# Patient Record
Sex: Female | Born: 1960 | Race: White | Hispanic: No | Marital: Single | Smoking: Current every day smoker
Health system: Southern US, Community
[De-identification: ages and names within clinical notes are randomized; demographics above are authoritative.]

## PROBLEM LIST (undated history)

## (undated) DIAGNOSIS — I639 Cerebral infarction, unspecified: Secondary | ICD-10-CM

## (undated) DIAGNOSIS — Z9989 Dependence on other enabling machines and devices: Secondary | ICD-10-CM

## (undated) DIAGNOSIS — E119 Type 2 diabetes mellitus without complications: Secondary | ICD-10-CM

## (undated) DIAGNOSIS — Z720 Tobacco use: Secondary | ICD-10-CM

## (undated) DIAGNOSIS — K219 Gastro-esophageal reflux disease without esophagitis: Secondary | ICD-10-CM

## (undated) DIAGNOSIS — N186 End stage renal disease: Secondary | ICD-10-CM

## (undated) DIAGNOSIS — I071 Rheumatic tricuspid insufficiency: Secondary | ICD-10-CM

## (undated) DIAGNOSIS — E559 Vitamin D deficiency, unspecified: Secondary | ICD-10-CM

## (undated) DIAGNOSIS — N189 Chronic kidney disease, unspecified: Secondary | ICD-10-CM

## (undated) DIAGNOSIS — E785 Hyperlipidemia, unspecified: Secondary | ICD-10-CM

## (undated) DIAGNOSIS — F32A Depression, unspecified: Secondary | ICD-10-CM

## (undated) DIAGNOSIS — Z9289 Personal history of other medical treatment: Secondary | ICD-10-CM

## (undated) DIAGNOSIS — G459 Transient cerebral ischemic attack, unspecified: Secondary | ICD-10-CM

## (undated) DIAGNOSIS — M810 Age-related osteoporosis without current pathological fracture: Secondary | ICD-10-CM

## (undated) DIAGNOSIS — I10 Essential (primary) hypertension: Secondary | ICD-10-CM

## (undated) DIAGNOSIS — K5900 Constipation, unspecified: Secondary | ICD-10-CM

## (undated) HISTORY — DX: Transient cerebral ischemic attack, unspecified: G45.9

## (undated) HISTORY — PX: REFRACTIVE SURGERY: SHX103

## (undated) HISTORY — DX: Hyperlipidemia, unspecified: E78.5

## (undated) HISTORY — PX: TONSILLECTOMY AND ADENOIDECTOMY: SHX28

## (undated) HISTORY — DX: Cerebral infarction, unspecified: I63.9

---
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
## 1998-08-16 ENCOUNTER — Emergency Department (HOSPITAL_COMMUNITY): Admission: EM | Admit: 1998-08-16 | Discharge: 1998-08-16 | Payer: Self-pay | Admitting: Emergency Medicine

## 1999-09-06 ENCOUNTER — Other Ambulatory Visit: Admission: RE | Admit: 1999-09-06 | Discharge: 1999-09-06 | Payer: Self-pay | Admitting: Family Medicine

## 1999-09-10 ENCOUNTER — Encounter: Payer: Self-pay | Admitting: Family Medicine

## 1999-09-10 ENCOUNTER — Encounter: Admission: RE | Admit: 1999-09-10 | Discharge: 1999-09-10 | Payer: Self-pay | Admitting: Family Medicine

## 1999-09-13 ENCOUNTER — Other Ambulatory Visit: Admission: RE | Admit: 1999-09-13 | Discharge: 1999-09-13 | Payer: Self-pay | Admitting: Obstetrics and Gynecology

## 1999-09-25 ENCOUNTER — Other Ambulatory Visit: Admission: RE | Admit: 1999-09-25 | Discharge: 1999-09-25 | Payer: Self-pay | Admitting: Obstetrics and Gynecology

## 2000-09-15 ENCOUNTER — Other Ambulatory Visit: Admission: RE | Admit: 2000-09-15 | Discharge: 2000-09-15 | Payer: Self-pay | Admitting: Family Medicine

## 2000-09-17 ENCOUNTER — Encounter: Payer: Self-pay | Admitting: Family Medicine

## 2000-09-17 ENCOUNTER — Encounter: Admission: RE | Admit: 2000-09-17 | Discharge: 2000-09-17 | Payer: Self-pay | Admitting: Family Medicine

## 2001-09-20 ENCOUNTER — Ambulatory Visit (HOSPITAL_COMMUNITY): Admission: RE | Admit: 2001-09-20 | Discharge: 2001-09-20 | Payer: Self-pay | Admitting: Family Medicine

## 2001-09-20 ENCOUNTER — Other Ambulatory Visit: Admission: RE | Admit: 2001-09-20 | Discharge: 2001-09-20 | Payer: Self-pay | Admitting: Family Medicine

## 2001-09-20 ENCOUNTER — Encounter: Payer: Self-pay | Admitting: Family Medicine

## 2001-09-29 ENCOUNTER — Encounter: Admission: RE | Admit: 2001-09-29 | Discharge: 2001-12-28 | Payer: Self-pay | Admitting: Family Medicine

## 2002-10-24 ENCOUNTER — Other Ambulatory Visit: Admission: RE | Admit: 2002-10-24 | Discharge: 2002-10-24 | Payer: Self-pay | Admitting: Family Medicine

## 2002-12-15 ENCOUNTER — Encounter: Admission: RE | Admit: 2002-12-15 | Discharge: 2003-03-15 | Payer: Self-pay | Admitting: Family Medicine

## 2003-02-08 ENCOUNTER — Encounter: Payer: Self-pay | Admitting: Family Medicine

## 2003-02-08 ENCOUNTER — Ambulatory Visit (HOSPITAL_COMMUNITY): Admission: RE | Admit: 2003-02-08 | Discharge: 2003-02-08 | Payer: Self-pay | Admitting: Family Medicine

## 2004-04-09 ENCOUNTER — Ambulatory Visit: Payer: Self-pay | Admitting: Family Medicine

## 2004-04-19 ENCOUNTER — Other Ambulatory Visit: Admission: RE | Admit: 2004-04-19 | Discharge: 2004-04-19 | Payer: Self-pay | Admitting: Family Medicine

## 2004-07-22 ENCOUNTER — Ambulatory Visit: Payer: Self-pay | Admitting: Family Medicine

## 2004-08-01 ENCOUNTER — Ambulatory Visit: Payer: Self-pay | Admitting: Family Medicine

## 2004-09-05 ENCOUNTER — Ambulatory Visit: Payer: Self-pay | Admitting: Family Medicine

## 2004-11-13 ENCOUNTER — Emergency Department (HOSPITAL_COMMUNITY): Admission: EM | Admit: 2004-11-13 | Discharge: 2004-11-13 | Payer: Self-pay | Admitting: Emergency Medicine

## 2004-11-15 ENCOUNTER — Ambulatory Visit: Payer: Self-pay | Admitting: Family Medicine

## 2005-12-11 ENCOUNTER — Encounter: Payer: Self-pay | Admitting: Family Medicine

## 2007-02-09 DIAGNOSIS — E1129 Type 2 diabetes mellitus with other diabetic kidney complication: Secondary | ICD-10-CM | POA: Insufficient documentation

## 2007-02-09 DIAGNOSIS — E1165 Type 2 diabetes mellitus with hyperglycemia: Secondary | ICD-10-CM

## 2007-02-09 DIAGNOSIS — I10 Essential (primary) hypertension: Secondary | ICD-10-CM | POA: Insufficient documentation

## 2007-02-09 DIAGNOSIS — E785 Hyperlipidemia, unspecified: Secondary | ICD-10-CM | POA: Insufficient documentation

## 2007-02-09 DIAGNOSIS — K219 Gastro-esophageal reflux disease without esophagitis: Secondary | ICD-10-CM | POA: Insufficient documentation

## 2011-01-02 ENCOUNTER — Telehealth: Payer: Self-pay | Admitting: Family Medicine

## 2011-01-02 NOTE — Telephone Encounter (Signed)
I left a voice message for pt to return our call. We received a copy of her lab results in today and wanted to know if she wanted to be worked in for a office visit tomorrow, if not then Dr. Sarajane Jews would be back next week and she needs to follow up to discuss lab results.

## 2011-03-12 ENCOUNTER — Emergency Department (HOSPITAL_COMMUNITY): Payer: 59

## 2011-03-12 ENCOUNTER — Encounter (HOSPITAL_COMMUNITY): Payer: Self-pay | Admitting: Radiology

## 2011-03-12 ENCOUNTER — Observation Stay (HOSPITAL_COMMUNITY)
Admission: EM | Admit: 2011-03-12 | Discharge: 2011-03-13 | Disposition: A | Discharge status: Home / Self Care | Payer: 59 | Attending: Emergency Medicine | Admitting: Emergency Medicine

## 2011-03-12 DIAGNOSIS — E119 Type 2 diabetes mellitus without complications: Secondary | ICD-10-CM | POA: Insufficient documentation

## 2011-03-12 DIAGNOSIS — I6350 Cerebral infarction due to unspecified occlusion or stenosis of unspecified cerebral artery: Principal | ICD-10-CM | POA: Insufficient documentation

## 2011-03-12 DIAGNOSIS — I10 Essential (primary) hypertension: Secondary | ICD-10-CM | POA: Insufficient documentation

## 2011-03-12 DIAGNOSIS — G459 Transient cerebral ischemic attack, unspecified: Secondary | ICD-10-CM

## 2011-03-12 HISTORY — DX: Essential (primary) hypertension: I10

## 2011-03-12 HISTORY — DX: Transient cerebral ischemic attack, unspecified: G45.9

## 2011-03-12 LAB — URINE MICROSCOPIC-ADD ON

## 2011-03-12 LAB — CBC
Hemoglobin: 14 g/dL (ref 12.0–15.0)
MCHC: 36.7 g/dL — ABNORMAL HIGH (ref 30.0–36.0)
WBC: 8.8 10*3/uL (ref 4.0–10.5)

## 2011-03-12 LAB — DIFFERENTIAL
Basophils Absolute: 0 10*3/uL (ref 0.0–0.1)
Basophils Relative: 0 % (ref 0–1)
Monocytes Absolute: 0.4 10*3/uL (ref 0.1–1.0)
Neutro Abs: 6 10*3/uL (ref 1.7–7.7)
Neutrophils Relative %: 68 % (ref 43–77)

## 2011-03-12 LAB — URINALYSIS, ROUTINE W REFLEX MICROSCOPIC
Glucose, UA: >1000 mg/dL — AB
Leukocytes, UA: NEGATIVE
Protein, ur: NEGATIVE mg/dL
Urobilinogen, UA: 0.2 mg/dL (ref 0.0–1.0)

## 2011-03-12 LAB — APTT: aPTT: 26 seconds (ref 24–37)

## 2011-03-12 LAB — COMPREHENSIVE METABOLIC PANEL
CO2: 26 mEq/L (ref 19–32)
Calcium: 10.7 mg/dL — ABNORMAL HIGH (ref 8.4–10.5)
Creatinine, Ser: <0.47 mg/dL — ABNORMAL LOW (ref 0.50–1.10)
Glucose, Bld: 357 mg/dL — ABNORMAL HIGH (ref 70–99)

## 2011-03-12 LAB — PROTIME-INR: INR: 0.94 (ref 0.00–1.49)

## 2011-03-13 ENCOUNTER — Observation Stay (HOSPITAL_COMMUNITY): Payer: 59

## 2011-03-13 DIAGNOSIS — R221 Localized swelling, mass and lump, neck: Secondary | ICD-10-CM

## 2011-03-13 DIAGNOSIS — R220 Localized swelling, mass and lump, head: Secondary | ICD-10-CM

## 2011-03-13 DIAGNOSIS — G459 Transient cerebral ischemic attack, unspecified: Secondary | ICD-10-CM

## 2011-03-13 LAB — POCT I-STAT, CHEM 8
BUN: 13 mg/dL (ref 6–23)
Chloride: 101 mEq/L (ref 96–112)
Sodium: 135 mEq/L (ref 135–145)
TCO2: 23 mmol/L (ref 0–100)

## 2011-03-13 LAB — LIPID PANEL
Cholesterol: 273 mg/dL — ABNORMAL HIGH (ref 0–200)
HDL: 33 mg/dL — ABNORMAL LOW
LDL Cholesterol: UNDETERMINED mg/dL (ref 0–99)
Total CHOL/HDL Ratio: 8.3 ratio
Triglycerides: 974 mg/dL — ABNORMAL HIGH
VLDL: UNDETERMINED mg/dL (ref 0–40)

## 2011-03-13 LAB — GLUCOSE, CAPILLARY
Glucose-Capillary: 244 mg/dL — ABNORMAL HIGH (ref 70–99)
Glucose-Capillary: 358 mg/dL — ABNORMAL HIGH (ref 70–99)

## 2011-03-13 LAB — CK TOTAL AND CKMB (NOT AT ARMC)
CK, MB: 2.6 ng/mL (ref 0.3–4.0)
Relative Index: INVALID (ref 0.0–2.5)
Total CK: 53 U/L (ref 7–177)

## 2011-03-13 LAB — TROPONIN I: Troponin I: <0.3 ng/mL (ref <0.30)

## 2011-03-18 ENCOUNTER — Other Ambulatory Visit (HOSPITAL_COMMUNITY)
Admission: RE | Admit: 2011-03-18 | Discharge: 2011-03-18 | Disposition: A | Discharge status: Home / Self Care | Payer: 59 | Source: Ambulatory Visit | Attending: Family Medicine | Admitting: Family Medicine

## 2011-03-18 ENCOUNTER — Ambulatory Visit (INDEPENDENT_AMBULATORY_CARE_PROVIDER_SITE_OTHER): Payer: Medicare Other | Admitting: Family Medicine

## 2011-03-18 ENCOUNTER — Encounter: Payer: Self-pay | Admitting: Family Medicine

## 2011-03-18 VITALS — BP 136/80 | HR 107 | Temp 98.6°F | Ht <= 58 in | Wt 83.0 lb

## 2011-03-18 DIAGNOSIS — Z0000 Encounter for general adult medical examination without abnormal findings: Secondary | ICD-10-CM

## 2011-03-18 DIAGNOSIS — Z01419 Encounter for gynecological examination (general) (routine) without abnormal findings: Secondary | ICD-10-CM | POA: Insufficient documentation

## 2011-03-18 MED ORDER — METFORMIN HCL 1000 MG PO TABS: 1000.0000 mg | ORAL_TABLET | Freq: Two times a day (BID) | ORAL | Status: DC

## 2011-03-18 MED ORDER — FENOFIBRATE 145 MG PO TABS: 145.0000 mg | ORAL_TABLET | Freq: Every day | ORAL | Status: DC

## 2011-03-18 MED ORDER — GLIPIZIDE 10 MG PO TABS: 10.0000 mg | ORAL_TABLET | Freq: Two times a day (BID) | ORAL | Status: DC

## 2011-03-18 MED ORDER — RAMIPRIL 10 MG PO CAPS: 10.0000 mg | ORAL_CAPSULE | Freq: Every day | ORAL | Status: DC

## 2011-03-18 NOTE — Progress Notes (Signed)
Subjective:    Patient ID: [name] Michael, female    DOB: 06-01-1961, 50 y.o.   MRN: [id number]  HPI 50 yr old female to re-establish with [name] and for a cpx. I have not seen her for about 5 years. She has been going to Urgent Care during this time. She has a hx of poorly controlled diabetes, even from my memory of taking care of her in the past. She has not had a GYN exam for over 5 years. She was admitted for an overnight stay at [facility] on 03-12-11 for an apparent TIA. She says her thinking suddenly became foggy that day, and she had trouble raising both of her arms above her head. I cannot locate any records of this stay in the computer, but she did bring in some papers today. Her A1c that day was 12.9, her TG were over 900, and her LDL was high. She ws sent home on the meds listed below. Since getting home she has felt fine with no neurologic deficits. She has been out of work since 03-12-11 on [employer].    Review of Systems  Constitutional: Negative.  Negative for fever, diaphoresis, activity change, appetite change, fatigue and unexpected weight change.  HENT: Negative.  Negative for hearing loss, ear pain, nosebleeds, congestion, sore throat, trouble swallowing, neck pain, neck stiffness, voice change and tinnitus.   Eyes: Negative.  Negative for photophobia, pain, discharge, redness and visual disturbance.  Respiratory: Negative.  Negative for apnea, cough, choking, chest tightness, shortness of breath, wheezing and stridor.   Cardiovascular: Negative.  Negative for chest pain, palpitations and leg swelling.  Gastrointestinal: Negative.  Negative for nausea, vomiting, abdominal pain, diarrhea, constipation, blood in stool, abdominal distention and rectal pain.  Genitourinary: Negative.  Negative for dysuria, urgency, frequency, hematuria, flank pain, vaginal bleeding, vaginal discharge, enuresis, difficulty urinating, vaginal pain and menstrual problem.  Musculoskeletal: Negative.  Negative  for myalgias, back pain, joint swelling, arthralgias and gait problem.  Skin: Negative.  Negative for color change, pallor, rash and wound.  Neurological: Negative.  Negative for dizziness, tremors, seizures, syncope, speech difficulty, weakness, light-headedness, numbness and headaches.  Hematological: Negative.  Negative for adenopathy. Does not bruise/bleed easily.  Psychiatric/Behavioral: Negative.  Negative for hallucinations, behavioral problems, confusion, sleep disturbance, dysphoric mood and agitation. The patient is not nervous/anxious.        Objective:   Physical Exam  Constitutional: She appears well-developed and well-nourished. No distress.  HENT:  Head: Normocephalic and atraumatic.  Right Ear: External ear normal.  Left Ear: External ear normal.  Nose: Nose normal.  Mouth/Throat: Oropharynx is clear and moist. No oropharyngeal exudate.  Eyes: Conjunctivae and EOM are normal. Pupils are equal, round, and reactive to light. Right eye exhibits no discharge. Left eye exhibits no discharge. No scleral icterus.  Neck: Normal range of motion. Neck supple. No JVD present. No thyromegaly present.  Cardiovascular: Normal rate, regular rhythm, normal heart sounds and intact distal pulses.  Exam reveals no gallop and no friction rub.   No murmur heard. Pulmonary/Chest: Effort normal and breath sounds normal. No stridor. No respiratory distress. She has no wheezes. She has no rales. She exhibits no tenderness.  Abdominal: Soft. Normal appearance and bowel sounds are normal. She exhibits no distension, no abdominal bruit, no ascites and no mass. There is no hepatosplenomegaly. There is no tenderness. There is no rigidity, no rebound and no guarding. No hernia.  Genitourinary: Rectum normal, vagina normal and uterus normal. No breast swelling, tenderness,  discharge or bleeding. Cervix exhibits no motion tenderness, no discharge and no friability. Right adnexum displays no mass, no tenderness  and no fullness. Left adnexum displays no mass, no tenderness and no fullness. No erythema, tenderness or bleeding around the vagina. No vaginal discharge found.  Musculoskeletal: Normal range of motion. She exhibits no edema and no tenderness.  Lymphadenopathy:    She has no cervical adenopathy.  Neurological: She is alert. She has normal reflexes. No cranial nerve deficit. She exhibits normal muscle tone. Coordination normal.  Skin: Skin is warm and dry. No rash noted. She is not diaphoretic. No erythema. No pallor.  Psychiatric: She has a normal mood and affect. Her behavior is normal. Judgment and thought content normal.          Assessment & Plan:  Well exam. I urged her to set up a mammogram soon. She is recovering nicely from a recent TIA. Her lipids and diabetes are obviously quite out of control. We will increase her meds as above, and she needs labwork again in about 2 months. We will plan for her to return to work on 03-24-11.

## 2011-03-19 ENCOUNTER — Telehealth: Payer: Self-pay | Admitting: Family Medicine

## 2011-03-19 DIAGNOSIS — E785 Hyperlipidemia, unspecified: Secondary | ICD-10-CM

## 2011-03-19 NOTE — Telephone Encounter (Signed)
Pt needs labwork in 2 month per pt. What labs to sch?

## 2011-03-21 NOTE — Telephone Encounter (Signed)
She will need fasting lipids, hepatic panel, and A1c

## 2011-03-24 NOTE — Telephone Encounter (Signed)
I put future lab order in computer and left message for pt to call and schedule the lab appointment.

## 2011-03-25 ENCOUNTER — Telehealth: Payer: Self-pay | Admitting: Family Medicine

## 2011-03-25 NOTE — Telephone Encounter (Signed)
Message copied by Rudi Coco on Tue Mar 25, 2011 12:56 PM ------      Message from: Alysia Penna A      Created: Fri Mar 21, 2011  1:57 PM       Normal. Repeat in one year

## 2011-03-25 NOTE — Telephone Encounter (Signed)
Left voice message with normal results.

## 2011-03-28 ENCOUNTER — Telehealth: Payer: Self-pay | Admitting: *Deleted

## 2011-03-28 ENCOUNTER — Emergency Department (HOSPITAL_COMMUNITY): Payer: 59

## 2011-03-28 ENCOUNTER — Encounter (HOSPITAL_COMMUNITY): Payer: Self-pay | Admitting: Radiology

## 2011-03-28 ENCOUNTER — Inpatient Hospital Stay (HOSPITAL_COMMUNITY)
Admission: EM | Admit: 2011-03-28 | Discharge: 2011-04-01 | DRG: 065 | Disposition: A | Discharge status: Home / Self Care | Payer: 59 | Attending: Neurology | Admitting: Neurology

## 2011-03-28 ENCOUNTER — Other Ambulatory Visit (HOSPITAL_COMMUNITY): Payer: 59

## 2011-03-28 ENCOUNTER — Encounter: Payer: Self-pay | Admitting: Family Medicine

## 2011-03-28 DIAGNOSIS — I672 Cerebral atherosclerosis: Secondary | ICD-10-CM | POA: Diagnosis present

## 2011-03-28 DIAGNOSIS — Q2111 Secundum atrial septal defect: Secondary | ICD-10-CM

## 2011-03-28 DIAGNOSIS — Z7982 Long term (current) use of aspirin: Secondary | ICD-10-CM

## 2011-03-28 DIAGNOSIS — I700 Atherosclerosis of aorta: Secondary | ICD-10-CM | POA: Diagnosis present

## 2011-03-28 DIAGNOSIS — E119 Type 2 diabetes mellitus without complications: Secondary | ICD-10-CM | POA: Diagnosis present

## 2011-03-28 DIAGNOSIS — F17200 Nicotine dependence, unspecified, uncomplicated: Secondary | ICD-10-CM | POA: Diagnosis present

## 2011-03-28 DIAGNOSIS — I6350 Cerebral infarction due to unspecified occlusion or stenosis of unspecified cerebral artery: Principal | ICD-10-CM | POA: Diagnosis present

## 2011-03-28 DIAGNOSIS — E7800 Pure hypercholesterolemia, unspecified: Secondary | ICD-10-CM | POA: Diagnosis present

## 2011-03-28 DIAGNOSIS — I10 Essential (primary) hypertension: Secondary | ICD-10-CM | POA: Diagnosis present

## 2011-03-28 DIAGNOSIS — Q211 Atrial septal defect: Secondary | ICD-10-CM

## 2011-03-28 LAB — CBC
Hemoglobin: 12.6 g/dL (ref 12.0–15.0)
MCH: 29 pg (ref 26.0–34.0)
MCHC: 34.4 g/dL (ref 30.0–36.0)
RDW: 12.7 % (ref 11.5–15.5)

## 2011-03-28 LAB — POCT I-STAT, CHEM 8
BUN: 10 mg/dL (ref 6–23)
Calcium, Ion: 1.17 mmol/L (ref 1.12–1.32)
Chloride: 103 mEq/L (ref 96–112)
Creatinine, Ser: 0.4 mg/dL — ABNORMAL LOW (ref 0.50–1.10)
Glucose, Bld: 226 mg/dL — ABNORMAL HIGH (ref 70–99)
HCT: 40 % (ref 36.0–46.0)
Hemoglobin: 13.6 g/dL (ref 12.0–15.0)
Potassium: 3.6 mEq/L (ref 3.5–5.1)
Sodium: 138 mEq/L (ref 135–145)
TCO2: 25 mmol/L (ref 0–100)

## 2011-03-28 LAB — COMPREHENSIVE METABOLIC PANEL
ALT: 10 U/L (ref 0–35)
AST: 12 U/L (ref 0–37)
CO2: 28 mEq/L (ref 19–32)
Calcium: 9.5 mg/dL (ref 8.4–10.5)
GFR calc non Af Amer: >90 mL/min (ref >90)
Potassium: 3.8 mEq/L (ref 3.5–5.1)
Sodium: 139 mEq/L (ref 135–145)
Total Protein: 7 g/dL (ref 6.0–8.3)

## 2011-03-28 LAB — GLUCOSE, CAPILLARY

## 2011-03-28 LAB — CK TOTAL AND CKMB (NOT AT ARMC): Total CK: 74 U/L (ref 7–177)

## 2011-03-28 LAB — TROPONIN I: Troponin I: <0.3 ng/mL (ref <0.30)

## 2011-03-28 MED ORDER — GADOBENATE DIMEGLUMINE 529 MG/ML IV SOLN
8.0000 mL | Freq: Once | INTRAVENOUS | Status: AC | PRN
  Administered 2011-03-28: 8 mL via INTRAVENOUS

## 2011-03-28 NOTE — Telephone Encounter (Signed)
Pt calls stating she is having numbness and weakness over and had a mini stroke last week.  Advised to call 911 and go straight to [facility] ER to rule out CVA.

## 2011-03-28 NOTE — Telephone Encounter (Signed)
agreed

## 2011-03-29 ENCOUNTER — Inpatient Hospital Stay (HOSPITAL_COMMUNITY): Payer: 59

## 2011-03-29 LAB — DRUGS OF ABUSE SCREEN W/O ALC, ROUTINE URINE
Benzodiazepines.: NEGATIVE
Opiate Screen, Urine: NEGATIVE
Phencyclidine (PCP): NEGATIVE
Propoxyphene: NEGATIVE

## 2011-03-29 LAB — URINALYSIS, MICROSCOPIC ONLY
Leukocytes, UA: NEGATIVE
Nitrite: NEGATIVE
Specific Gravity, Urine: 1.014 (ref 1.005–1.030)
pH: 6 (ref 5.0–8.0)

## 2011-03-29 LAB — LIPID PANEL
Total CHOL/HDL Ratio: 5.1 RATIO
VLDL: 71 mg/dL — ABNORMAL HIGH (ref 0–40)

## 2011-03-29 LAB — GLUCOSE, CAPILLARY
Glucose-Capillary: 188 mg/dL — ABNORMAL HIGH (ref 70–99)
Glucose-Capillary: 165 mg/dL — ABNORMAL HIGH (ref 70–99)

## 2011-03-30 LAB — GLUCOSE, CAPILLARY: Glucose-Capillary: 178 mg/dL — ABNORMAL HIGH (ref 70–99)

## 2011-03-31 DIAGNOSIS — Q211 Atrial septal defect: Secondary | ICD-10-CM

## 2011-03-31 LAB — GLUCOSE, CAPILLARY
Glucose-Capillary: 165 mg/dL — ABNORMAL HIGH (ref 70–99)
Glucose-Capillary: 113 mg/dL — ABNORMAL HIGH (ref 70–99)

## 2011-04-01 DIAGNOSIS — M79609 Pain in unspecified limb: Secondary | ICD-10-CM

## 2011-04-01 DIAGNOSIS — I639 Cerebral infarction, unspecified: Secondary | ICD-10-CM

## 2011-04-01 HISTORY — DX: Cerebral infarction, unspecified: I63.9

## 2011-04-01 LAB — GLUCOSE, CAPILLARY
Glucose-Capillary: 210 mg/dL — ABNORMAL HIGH (ref 70–99)
Glucose-Capillary: 223 mg/dL — ABNORMAL HIGH (ref 70–99)

## 2011-04-01 LAB — C4 COMPLEMENT: Complement C4, Body Fluid: 37 mg/dL (ref 10–40)

## 2011-04-01 LAB — PROTEIN S ACTIVITY: Protein S Activity: 138 % — ABNORMAL HIGH (ref 69–129)

## 2011-04-01 LAB — LUPUS ANTICOAGULANT PANEL: PTT Lupus Anticoagulant: 34.2 secs (ref 30.0–45.6)

## 2011-04-01 LAB — BETA-2-GLYCOPROTEIN I ABS, IGG/M/A
Beta-2 Glyco I IgG: 0 G Units (ref <20)
Beta-2-Glycoprotein I IgA: 5 A Units (ref <20)

## 2011-04-01 LAB — ANTITHROMBIN III: AntiThromb III Func: 128 % — ABNORMAL HIGH (ref 76–126)

## 2011-04-01 LAB — PROTEIN C ACTIVITY: Protein C Activity: 200 % — ABNORMAL HIGH (ref 75–133)

## 2011-04-01 NOTE — H&P (Signed)
[name], Michael                ACCOUNT NO.:  [id number]  MEDICAL RECORD NO.:  [id number]  LOCATION:  [id number]                         FACILITY:  [place]  PHYSICIAN:  Jill Alexanders, M.D.  DATE OF BIRTH:  [birth year]  DATE OF ADMISSION:  03/28/2011 DATE OF DISCHARGE:                             HISTORY & PHYSICAL   HISTORY OF PRESENT ILLNESS:  [name] Michael is a 50 year old right-handed white female born on 02-22-1961.  This patient was seen through the emergency room on March 12, 2011, was found to have a subcortical stroke affecting the left centrum semiovale and left putamen area.  This patient was treated with aspirin.  A carotid Doppler study done at that time was unremarkable without significant carotid stenosis.  A 2-D echocardiogram also showed no significant abnormalities.  The patient had done relatively well but today around 11 a.m., the patient noted onset of problems with right arm numbness and tingling sensations.  The patient denied any weakness.  The patient was still able to ambulate, denied any visual field changes, headache or evidence of dizziness, visual field changes.  The patient has undergone MRI scan of the brain once again showing very small acute infarcts in the deep white matter on the left felt to be new from prior strokes.  The patient is being brought in for further evaluation.  PAST MEDICAL HISTORY:  Significant for: 1. Diabetes. 2. Hypertension. 3. Dyslipidemia. 4. Tonsillectomy. 5. Recent left brain stroke.  MEDICATIONS: 1. Metformin 1000 mg twice daily. 2. Glimepiride unknown dose twice daily. 3. Aspirin 81 mg daily. 4. TriCor 145 mg daily. 5. Ramipril 10 mg daily.  ALLERGIES:  The patient has an allergy to HYDROCODONE which causes nausea and smokes half-pack of cigarettes daily.  Does not drink alcohol.  SOCIAL HISTORY:  This patient is single, has no children, lives alone, works in [employer].  FAMILY MEDICAL HISTORY:  Mother died  with diabetes, congestive heart failure, end-stage renal disease.  Father died with stroke and pneumonia.  REVIEW OF SYSTEMS:  Notable for some occasional chills.  The patient has had some left jaw pain that she relates to her teeth.  The patient has had a slight headache today.  Denies shortness of breath, chest pain, abdominal pain, nausea, vomiting.  The patient denies any blackout episodes or dizziness.  PHYSICAL EXAMINATION:  VITAL SIGNS:  Blood pressure is 163/99, heart rate 106, respiratory rate 16, temperature afebrile. GENERAL:  This patient is a fairly well-developed, petite white female who is alert and cooperative at the time of examination. HEENT:  Head is atraumatic.  Eyes:  Pupils are equal, round, reactive to light.  Disks were not visualized. NECK:  Supple.  No carotid bruits noted. RESPIRATORY:  Clear. CARDIOVASCULAR:  Regular rate and rhythm.  No obvious murmurs or rubs noted. EXTREMITIES:  Without significant edema. NEUROLOGIC:  Cranial nerves as above.  Facial symmetry is present.  The patient has good sensation of face to pinprick and soft touch bilaterally.  She has good strength in facial muscle, muscles to head turn and shoulder shrug bilaterally.  Speech is well enunciated, not aphasic.  Motor testing reveals 5/5 strength in all fours.  Good symmetric motor tone is noted throughout.  Sensory testing is intact to pinprick, soft touch, vibratory sensation throughout.  The patient has good finger-nose-finger and heel-to-shin.  Gait was not tested.  No drift seen in the arms or legs.  Deep tendon reflexes are depressed but symmetric.  Toes are neutral bilaterally.  LABORATORY VALUES:  Notable for a white count of 7.9, hemoglobin of 12.6, hematocrit of 36.6, MCV of 84.1, platelets of 386.  CBG is 225. Sodium 138, potassium 3.6, chloride of 103, glucose of 226, BUN of 10, creatinine 0.4, ionized calcium 1.17.  MRI scan of the head is as  above.  IMPRESSION: 1. Subcortical left brain stroke recurring in the last 2 weeks. 2. Diabetes. 3. Hypertension.  The patient does have some risk factors for stroke, but has had two events in the last couple of weeks.  The patient will be brought in for brief evaluation.  The patient will undergo an MRI angiogram of the extracranial vessels which was not done before and will contact Cardiology concerning scheduling for a TEE.  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  We will follow the patient's clinical course while in- house.     Jill Alexanders, M.D.     CKW/MEDQ  D:  03/28/2011  T:  03/29/2011  Job:  [id number]  Electronically Signed by Roxy Cedar M.D. on 04/01/2011 [id number] PM

## 2011-04-02 LAB — PROTEIN S, TOTAL: Protein S Ag, Total: 223 % — ABNORMAL HIGH (ref 60–150)

## 2011-04-02 LAB — COMPLEMENT, TOTAL: Compl, Total (CH50): >60 U/mL — ABNORMAL HIGH (ref 31–60)

## 2011-04-02 LAB — PROTEIN C, TOTAL: Protein C, Total: 219 % — ABNORMAL HIGH (ref 72–160)

## 2011-04-03 NOTE — Discharge Summary (Signed)
[name], [name]                ACCOUNT NO.:  [id number]  MEDICAL RECORD NO.:  [id number]  LOCATION:  [id number]                         FACILITY:  [facility]  PHYSICIAN:  Rita Prom P. [name], MD    DATE OF BIRTH:  [birth year]  DATE OF ADMISSION:  03/28/2011 DATE OF DISCHARGE:  04/01/2011                              DISCHARGE SUMMARY   ADMISSION DIAGNOSIS:  Stroke.  DISCHARGE DIAGNOSES: 1. Acute and subacute left frontal subcortical infarcts, secondary to     intracranial arteriosclerosis as well as small vessel disease. 2. Uncontrolled diabetes. 3. Hypertension. 4. Hyperlipidemia. 5. Patent foramen ovale.  HOSPITAL COURSE:  [name] Michael is a 50 year old Caucasian lady, who was presented to [facility] initially on September 19 and found to have left frontal subcortical infarct involving the centrum semiovale and putamen.  I was started on aspirin and at that time workup included carotid ultrasound, which was unremarkable and 2D echo showed normal ejection fraction.  She presented on March 28, 2011, with sudden onset of right upper extremity tingling and numbness, and some decreased fine motor dexterity.  She underwent MRI scan of the brain, which showed acute infarct in the left frontal white matter adjacent to the previous subcortical infarcts.  She is admitted for further evaluation.  MRA of the brain showed no large vessel occlusion.  MRA of the neck showed no extracranial large vessel stenosis.  Hemoglobin A1c was significantly elevated at 11.6 with a mean plasma glucose of 286 mg%.  Urine-drug screen was negative.  Total cholesterol 175, triglycerides elevated at 355, HDL was low at 34, and LDL was normal at 70 mg%.  ANA panel was negative.  Hypercoagulable panel labs were pending at the time of discharge.  Only labs which were back was antithrombin 3 and homocysteine, which were normal.  Complement levels were normal.  RPR was negative.  ESR was 40 mm.  The 2D echo showed  normal ejection fraction of 60-65% without obvious cardiac source of embolism. Transesophageal echocardiogram showed normal ejection fraction of 60-65% and showed a patent foramen ovale with right to left cardiac shunt.  The patient had lower extremity venous Doppler, which showed no evidence of deep vein thrombosis.  The patient was on aspirin, which was switched to Plavix for secondary stroke prevention.  She was also started on fenofibrate for elevated triglycerides.  Her symptoms resolved and she had minimum clumsiness in the right hand, but no major deficits at the time of discharge.  She is able to ambulate independently without assistance.  MEDICATIONS AT TIME OF DISCHARGE: 1. Clopidogrel 75 mg daily. 2. Fenofibrate 160 mg daily. 3. Glucotrol 10 mg twice a day. 4. Glucophage 1000 mg twice a day. 5. Metoprolol 25 mg twice a day. 6. Altace 10 mg daily.  DISCHARGE INSTRUCTIONS:  The patient was advised to follow up with Dr. Leonie Michael in his office in 2 weeks for transcranial Doppler bubble study and emboli monitoring as well as with his primary care physician in few weeks.  The patient was also advised to consider possible participation in the [place] PFO closure trial.     [name] Kinker P. [name], MD     PPS/MEDQ  D:  04/01/2011  T:  04/01/2011  Job:  [id number]  Electronically Signed by Antony Contras MD on 04/03/2011 10:07:18 PM

## 2011-04-04 DIAGNOSIS — Z0279 Encounter for issue of other medical certificate: Secondary | ICD-10-CM

## 2011-05-12 ENCOUNTER — Other Ambulatory Visit (INDEPENDENT_AMBULATORY_CARE_PROVIDER_SITE_OTHER): Payer: 59

## 2011-05-12 DIAGNOSIS — E785 Hyperlipidemia, unspecified: Secondary | ICD-10-CM

## 2011-05-12 DIAGNOSIS — E119 Type 2 diabetes mellitus without complications: Secondary | ICD-10-CM

## 2011-05-12 LAB — LIPID PANEL
Cholesterol: 171 mg/dL (ref 0–200)
HDL: 41.1 mg/dL (ref >39.00)
Total CHOL/HDL Ratio: 4
Triglycerides: 307 mg/dL — ABNORMAL HIGH (ref 0.0–149.0)
VLDL: 61.4 mg/dL — ABNORMAL HIGH (ref 0.0–40.0)

## 2011-05-12 LAB — HEMOGLOBIN A1C: Hgb A1c MFr Bld: 8.7 % — ABNORMAL HIGH (ref 4.6–6.5)

## 2011-05-12 LAB — HEPATIC FUNCTION PANEL
AST: 24 U/L (ref 0–37)
Albumin: 4.2 g/dL (ref 3.5–5.2)
Total Bilirubin: 0.4 mg/dL (ref 0.3–1.2)

## 2011-05-13 LAB — LDL CHOLESTEROL, DIRECT: Direct LDL: 105.3 mg/dL

## 2011-05-20 NOTE — Progress Notes (Signed)
Quick Note:  Left voice message ______ 

## 2012-04-02 ENCOUNTER — Other Ambulatory Visit: Payer: Self-pay | Admitting: Family Medicine

## 2012-05-25 ENCOUNTER — Encounter: Payer: Self-pay | Admitting: Family Medicine

## 2012-05-25 ENCOUNTER — Ambulatory Visit (INDEPENDENT_AMBULATORY_CARE_PROVIDER_SITE_OTHER): Payer: Self-pay | Admitting: Family Medicine

## 2012-05-25 VITALS — BP 140/96 | HR 109 | Temp 98.8°F | Ht <= 58 in | Wt 94.0 lb

## 2012-05-25 DIAGNOSIS — E785 Hyperlipidemia, unspecified: Secondary | ICD-10-CM

## 2012-05-25 DIAGNOSIS — E119 Type 2 diabetes mellitus without complications: Secondary | ICD-10-CM

## 2012-05-25 DIAGNOSIS — Z0000 Encounter for general adult medical examination without abnormal findings: Secondary | ICD-10-CM

## 2012-05-25 DIAGNOSIS — Z23 Encounter for immunization: Secondary | ICD-10-CM

## 2012-05-25 LAB — POCT URINALYSIS DIPSTICK
Bilirubin, UA: NEGATIVE
Ketones, UA: NEGATIVE
Leukocytes, UA: NEGATIVE

## 2012-05-25 LAB — CBC WITH DIFFERENTIAL/PLATELET
Basophils Absolute: 0 10*3/uL (ref 0.0–0.1)
Basophils Relative: 0.4 % (ref 0.0–3.0)
Hemoglobin: 12.3 g/dL (ref 12.0–15.0)
Lymphocytes Relative: 27 % (ref 12.0–46.0)
Monocytes Relative: 6 % (ref 3.0–12.0)
Neutro Abs: 6.3 10*3/uL (ref 1.4–7.7)
RBC: 4.98 Mil/uL (ref 3.87–5.11)
WBC: 9.8 10*3/uL (ref 4.5–10.5)

## 2012-05-25 LAB — TSH: TSH: 0.96 u[IU]/mL (ref 0.35–5.50)

## 2012-05-25 LAB — LIPID PANEL
HDL: 40.9 mg/dL (ref >39.00)
Total CHOL/HDL Ratio: 6
VLDL: 121.8 mg/dL — ABNORMAL HIGH (ref 0.0–40.0)

## 2012-05-25 LAB — BASIC METABOLIC PANEL
BUN: 8 mg/dL (ref 6–23)
GFR: 141.12 mL/min (ref >60.00)
Potassium: 4.4 mEq/L (ref 3.5–5.1)
Sodium: 132 mEq/L — ABNORMAL LOW (ref 135–145)

## 2012-05-25 LAB — HEPATIC FUNCTION PANEL
AST: 16 U/L (ref 0–37)
Total Bilirubin: 0.3 mg/dL (ref 0.3–1.2)

## 2012-05-25 MED ORDER — RAMIPRIL 10 MG PO CAPS: 10.0000 mg | ORAL_CAPSULE | Freq: Every day | ORAL | Status: DC

## 2012-05-25 MED ORDER — METFORMIN HCL 1000 MG PO TABS: 1000.0000 mg | ORAL_TABLET | Freq: Two times a day (BID) | ORAL | Status: DC

## 2012-05-25 MED ORDER — GLIPIZIDE 10 MG PO TABS: 10.0000 mg | ORAL_TABLET | Freq: Two times a day (BID) | ORAL | Status: DC

## 2012-05-25 NOTE — Progress Notes (Signed)
  Subjective:    Patient ID: [name] Michael, female    DOB: 09/06/1960, 51 y.o.   MRN: [id number]  HPI 51 yr old female for a cpx. She is doing well in general although her glucoses are a bit labile. Her am fasting readings usually run from 120 to 150, but they may run up to the 300s. Her BP is stable. She had a stroke one year ago which originally caused weakness in the right arm and hand. She went to OT and PT for a while, and she has completely recovered. She took Plavix for one year, but this was stopped and she was switched to aspirin last month when she saw Dr. [name]. He has released her back to our care now.    Review of Systems  Constitutional: Negative.   HENT: Negative.   Eyes: Negative.   Respiratory: Negative.   Cardiovascular: Negative.   Gastrointestinal: Negative.   Genitourinary: Negative for dysuria, urgency, frequency, hematuria, flank pain, decreased urine volume, enuresis, difficulty urinating, pelvic pain and dyspareunia.  Musculoskeletal: Negative.   Skin: Negative.   Neurological: Negative.   Hematological: Negative.   Psychiatric/Behavioral: Negative.        Objective:   Physical Exam  Constitutional: She is oriented to person, place, and time. She appears well-developed and well-nourished. No distress.  HENT:  Head: Normocephalic and atraumatic.  Right Ear: External ear normal.  Left Ear: External ear normal.  Nose: Nose normal.  Mouth/Throat: Oropharynx is clear and moist. No oropharyngeal exudate.  Eyes: Conjunctivae normal and EOM are normal. Pupils are equal, round, and reactive to light. No scleral icterus.  Neck: Normal range of motion. Neck supple. No JVD present. No thyromegaly present.  Cardiovascular: Normal rate, regular rhythm, normal heart sounds and intact distal pulses.  Exam reveals no gallop and no friction rub.   No murmur heard. Pulmonary/Chest: Effort normal and breath sounds normal. No respiratory distress. She has no wheezes. She has  no rales. She exhibits no tenderness.  Abdominal: Soft. Bowel sounds are normal. She exhibits no distension and no mass. There is no tenderness. There is no rebound and no guarding.  Musculoskeletal: Normal range of motion. She exhibits no edema and no tenderness.  Lymphadenopathy:    She has no cervical adenopathy.  Neurological: She is alert and oriented to person, place, and time. She has normal reflexes. No cranial nerve deficit. She exhibits normal muscle tone. Coordination normal.  Skin: Skin is warm and dry. No rash noted. No erythema.  Psychiatric: She has a normal mood and affect. Her behavior is normal. Judgment and thought content normal.          Assessment & Plan:  Well exam. Get fasting labs today including an A1c. She seems to be back at her pre-stroke baseline. Continue on aspirin. Because she has no health insurance at this tine, she declines a colonoscopy

## 2012-05-28 NOTE — Progress Notes (Signed)
Quick Note:  I left a voice message for pt to return my call. ______

## 2012-05-31 MED ORDER — FENOFIBRATE 160 MG PO TABS: 160.0000 mg | ORAL_TABLET | Freq: Every day | ORAL | Status: DC

## 2012-05-31 MED ORDER — INSULIN GLARGINE 100 UNIT/ML ~~LOC~~ SOLN: 20.0000 [IU] | Freq: Every day | SUBCUTANEOUS | Status: DC

## 2012-05-31 MED ORDER — INSULIN PEN NEEDLE 32G X 4 MM MISC: Status: DC

## 2012-05-31 NOTE — Addendum Note (Signed)
Addended by: Aggie Hacker A on: 05/31/2012 02:56 PM   Modules accepted: Orders

## 2012-05-31 NOTE — Progress Notes (Signed)
Quick Note:  I spoke with pt and sent scripts e-scribe. Pt will come by office for injection training on 06/01/12. ______

## 2012-08-24 ENCOUNTER — Emergency Department (HOSPITAL_COMMUNITY)
Admission: EM | Admit: 2012-08-24 | Discharge: 2012-08-24 | Disposition: A | Discharge status: Home / Self Care | Payer: No Typology Code available for payment source | Source: Home / Self Care

## 2012-08-24 ENCOUNTER — Encounter (HOSPITAL_COMMUNITY): Payer: Self-pay

## 2012-08-24 DIAGNOSIS — E781 Pure hyperglyceridemia: Secondary | ICD-10-CM

## 2012-08-24 DIAGNOSIS — I10 Essential (primary) hypertension: Secondary | ICD-10-CM

## 2012-08-24 DIAGNOSIS — E119 Type 2 diabetes mellitus without complications: Secondary | ICD-10-CM

## 2012-08-24 LAB — COMPREHENSIVE METABOLIC PANEL
ALT: 20 U/L (ref 0–35)
AST: 17 U/L (ref 0–37)
CO2: 25 mEq/L (ref 19–32)
Calcium: 10.5 mg/dL (ref 8.4–10.5)
GFR calc non Af Amer: >90 mL/min (ref >90)
Sodium: 136 mEq/L (ref 135–145)

## 2012-08-24 LAB — LIPID PANEL
HDL: 46 mg/dL (ref >39)
LDL Cholesterol: UNDETERMINED mg/dL (ref 0–99)

## 2012-08-24 LAB — HEMOGLOBIN A1C: Hgb A1c MFr Bld: 8.4 % — ABNORMAL HIGH (ref <5.7)

## 2012-08-24 MED ORDER — INSULIN PEN NEEDLE 32G X 4 MM MISC: Status: DC

## 2012-08-24 MED ORDER — ASPIRIN 81 MG PO TABS: 81.0000 mg | ORAL_TABLET | Freq: Every day | ORAL | Status: DC

## 2012-08-24 MED ORDER — GLIPIZIDE 10 MG PO TABS: 10.0000 mg | ORAL_TABLET | Freq: Two times a day (BID) | ORAL | Status: DC

## 2012-08-24 MED ORDER — INSULIN GLARGINE 100 UNIT/ML ~~LOC~~ SOLN: 25.0000 [IU] | Freq: Every day | SUBCUTANEOUS | Status: DC

## 2012-08-24 MED ORDER — PREGABALIN 50 MG PO CAPS: 50.0000 mg | ORAL_CAPSULE | Freq: Every day | ORAL | Status: DC

## 2012-08-24 MED ORDER — RAMIPRIL 10 MG PO CAPS: 10.0000 mg | ORAL_CAPSULE | Freq: Every day | ORAL | Status: DC

## 2012-08-24 MED ORDER — FENOFIBRATE 160 MG PO TABS: 160.0000 mg | ORAL_TABLET | Freq: Every day | ORAL | Status: DC

## 2012-08-24 NOTE — ED Notes (Signed)
Patient has a history of DM Two mini strokes

## 2012-08-24 NOTE — ED Provider Notes (Signed)
History     CSN: [id number]  Arrival date & time 08/24/12  1127  Pleasant 52 year old female with a history of type 2 diabetes, insulin-dependent, hypertension, hypertriglyceridemia, with a last hemoglobin A1c of 10.4 presents to the ER because she has ran out of her medications and also to establish care. The patient states that she has been checking her CBGs at home and they have been running around 120 however the patient's sister-in-law who are complete to the suggesting that the patient is not very accurate with her numbers. The patient lost her job in December and is about to lose her house and has been under a lot of stress.  She denies any chest pain, shortness of breath, dizziness, lightheadedness  Chief Complaint  Patient presents with  . Diabetes   hypertension Hypertriglyceridemia      HPI  Past Medical History  Diagnosis Date  . Diabetes mellitus   . Hypertension   . TIA (transient ischemic attack) 03-12-11  . Hyperlipidemia   . 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 [name]     History reviewed. No pertinent past surgical history.  Family History  Problem Relation Age of Onset  . Stroke Mother   . Diabetes Mother     History  Substance Use Topics  . Smoking status: Current Every Day Smoker -- 0.50 packs/day for 32 years    Types: Cigarettes  . Smokeless tobacco: Never Used     Comment: or less  . Alcohol Use: No    OB History   Grav Para Term Preterm Abortions TAB SAB Ect Mult Living                  Review of Systems As in history of present illness Allergies  Hydrocodone  Home Medications   Current Outpatient Rx  Name  Route  Sig  Dispense  Refill  . aspirin 81 MG tablet   Oral   Take 1 tablet (81 mg total) by mouth daily.   30 tablet   12   . fenofibrate 160 MG tablet   Oral   Take 1 tablet (160 mg total) by mouth daily.   30 tablet   6   . glipiZIDE (GLUCOTROL) 10 MG tablet   Oral   Take 1 tablet (10 mg total) by  mouth 2 (two) times daily before a meal.   60 tablet   2   . insulin glargine (LANTUS SOLOSTAR) 100 UNIT/ML injection   Subcutaneous   Inject 25 Units into the skin at bedtime.   30 mL   4     Dispense flex pen and diagnosis code is 250.00   . Insulin Pen Needle 32G X 4 MM MISC      Use once per day and diagnosis code is 250.00   100 each   12   . Multiple Vitamin (MULTIVITAMIN) tablet   Oral   Take 1 tablet by mouth daily.           . pregabalin (LYRICA) 50 MG capsule   Oral   Take 1 capsule (50 mg total) by mouth daily.   30 capsule   4   . ramipril (ALTACE) 10 MG capsule   Oral   Take 1 capsule (10 mg total) by mouth daily.   30 capsule   2     BP 139/97  Pulse 110  Temp(Src) 98.6 F (37 C) (Oral)  SpO2 98%  Physical Exam Head: Normocephalic and atraumatic.  Right Ear: External ear normal.  Left Ear: External ear normal.  Nose: Nose normal.  Mouth/Throat: Oropharynx is clear and moist. No oropharyngeal exudate.  Eyes: Conjunctivae normal and EOM are normal. Pupils are equal, round, and reactive to light. No scleral icterus.  Neck: Normal range of motion. Neck supple. No JVD present. No thyromegaly present.  Cardiovascular: Normal rate, regular rhythm, normal heart sounds and intact distal pulses. Exam reveals no gallop and no friction rub.  No murmur heard.  Pulmonary/Chest: Effort normal and breath sounds normal. No respiratory distress. She has no wheezes. She has no rales. She exhibits no tenderness.  Abdominal: Soft. Bowel sounds are normal. She exhibits no distension and no mass. There is no tenderness. There is no rebound and no guarding.  Musculoskeletal: Normal range of motion. She exhibits no edema and no tenderness.  Lymphadenopathy:  She has no cervical adenopathy.  Neurological: She is alert and oriented to person, place, and time. She has normal reflexes. No cranial nerve deficit. She exhibits normal muscle tone. Coordination normal.  Skin:  Skin is warm and dry. No rash noted. No erythema.  Psychiatric: She has a normal mood and affect. Her behavior is normal. Judgment and thought content normal.   ED Course  Procedures (including critical care time)  Labs Reviewed  HEMOGLOBIN A1C  LIPID PANEL  COMPREHENSIVE METABOLIC PANEL   No results found.   No diagnosis found.    MDM  Type 2 diabetes, insulin-dependent, her glipizide and her Lantus has been refilled her metformin has been discontinued, will dose of her Lantus has been increased to 25 units, she'll continue with the same dose of glipizide, we'll repeat a hemoglobin A1c, lipid panel  Hypertension, the patient will continue her Altace and recheck her creatinine today  Hypertriglyceridemia continue the patient on fenofibrate, repeat lipid panel today, her last triglyceride level was 606  Patient has been recommended to continue with a baby aspirin  Followup in 2 months         Reyne Dumas, MD 08/24/12 1201

## 2012-09-07 ENCOUNTER — Encounter (HOSPITAL_COMMUNITY): Payer: Self-pay

## 2012-09-07 ENCOUNTER — Emergency Department (INDEPENDENT_AMBULATORY_CARE_PROVIDER_SITE_OTHER)
Admission: EM | Admit: 2012-09-07 | Discharge: 2012-09-07 | Disposition: A | Discharge status: Home / Self Care | Payer: No Typology Code available for payment source | Source: Home / Self Care

## 2012-09-07 DIAGNOSIS — K219 Gastro-esophageal reflux disease without esophagitis: Secondary | ICD-10-CM

## 2012-09-07 DIAGNOSIS — E785 Hyperlipidemia, unspecified: Secondary | ICD-10-CM

## 2012-09-07 DIAGNOSIS — E119 Type 2 diabetes mellitus without complications: Secondary | ICD-10-CM

## 2012-09-07 DIAGNOSIS — I10 Essential (primary) hypertension: Secondary | ICD-10-CM

## 2012-09-07 MED ORDER — DESLORATADINE 5 MG PO TABS: 5.0000 mg | ORAL_TABLET | Freq: Every day | ORAL | Status: DC

## 2012-09-07 MED ORDER — OLMESARTAN MEDOXOMIL 20 MG PO TABS: 20.0000 mg | ORAL_TABLET | Freq: Every day | ORAL | Status: DC

## 2012-09-07 MED ORDER — ROSUVASTATIN CALCIUM 20 MG PO TABS: 20.0000 mg | ORAL_TABLET | Freq: Every day | ORAL | Status: DC

## 2012-09-07 NOTE — ED Provider Notes (Signed)
History     CSN: [id number]  Arrival date & time 09/07/12  1508   First MD Initiated Contact with Patient 09/07/12 1510      Chief Complaint  Patient presents with  . Eye Pain    HPI Agents 52 year old female who presents for followup on diabetes, hypertension, hyperlipidemia. She has had blood tests none recently and would like to know the results. She explains that she has had chronic left eye pain and would like referral to ophthalmologist. Patient denies chest pain or shortness of breath, no specific abdominal or urinary concerns, no significant changes in appetite. No systemic symptoms.  Past Medical History  Diagnosis Date  . Diabetes mellitus   . Hypertension   . TIA (transient ischemic attack) 03-12-11  . Hyperlipidemia   . 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 [name]     History reviewed. No pertinent past surgical history.  Family History  Problem Relation Age of Onset  . Stroke Mother   . Diabetes Mother     History  Substance Use Topics  . Smoking status: Current Every Day Smoker -- 0.50 packs/day for 32 years    Types: Cigarettes  . Smokeless tobacco: Never Used     Comment: or less  . Alcohol Use: No    OB History   Grav Para Term Preterm Abortions TAB SAB Ect Mult Living                  Review of Systems  Constitutional: Negative for fever, chills, diaphoresis, activity change, appetite change and fatigue.  HENT: Negative for ear pain, nosebleeds, congestion, facial swelling, rhinorrhea, neck pain, neck stiffness and ear discharge.   Eyes: Negative for pain, discharge, redness, itching and visual disturbance.  Respiratory: Negative for cough, choking, chest tightness, shortness of breath, wheezing and stridor.   Cardiovascular: Negative for chest pain, palpitations and leg swelling.  Gastrointestinal: Negative for abdominal distention.  Genitourinary: Negative for dysuria, urgency, frequency, hematuria, flank pain, decreased urine  volume, difficulty urinating and dyspareunia.  Musculoskeletal: Negative for back pain, joint swelling, arthralgias and gait problem.  Neurological: Negative for dizziness, tremors, seizures, syncope, facial asymmetry, speech difficulty, weakness, light-headedness, numbness and headaches.  Hematological: Negative for adenopathy. Does not bruise/bleed easily.  Psychiatric/Behavioral: Negative for hallucinations, behavioral problems, confusion, dysphoric mood, decreased concentration and agitation.    Allergies  Hydrocodone  Home Medications   Current Outpatient Rx  Name  Route  Sig  Dispense  Refill  . aspirin 81 MG tablet   Oral   Take 1 tablet (81 mg total) by mouth daily.   30 tablet   12   . fenofibrate 160 MG tablet   Oral   Take 1 tablet (160 mg total) by mouth daily.   30 tablet   6   . glipiZIDE (GLUCOTROL) 10 MG tablet   Oral   Take 1 tablet (10 mg total) by mouth 2 (two) times daily before a meal.   60 tablet   2   . insulin glargine (LANTUS SOLOSTAR) 100 UNIT/ML injection   Subcutaneous   Inject 25 Units into the skin at bedtime.   30 mL   4     Dispense flex pen and diagnosis code is 250.00   . Insulin Pen Needle 32G X 4 MM MISC      Use once per day and diagnosis code is 250.00   100 each   12   . Multiple Vitamin (MULTIVITAMIN) tablet  Oral   Take 1 tablet by mouth daily.           . pregabalin (LYRICA) 50 MG capsule   Oral   Take 1 capsule (50 mg total) by mouth daily.   30 capsule   4   . ramipril (ALTACE) 10 MG capsule   Oral   Take 1 capsule (10 mg total) by mouth daily.   30 capsule   2     BP 146/84  Pulse 110  Temp(Src) 98.1 F (36.7 C) (Oral)  SpO2 98%  Physical Exam  Constitutional: Appears well-developed and well-nourished. No distress.  HENT: Normocephalic. External right and left ear normal. Oropharynx is clear and moist.  Eyes: Conjunctivae and EOM are normal. PERRLA, no scleral icterus.  Neck: Normal ROM. Neck  supple. No JVD. No tracheal deviation. No thyromegaly.  CVS: RRR, S1/S2 +, no murmurs, no gallops, no carotid bruit.  Pulmonary: Effort and breath sounds normal, no stridor, rhonchi, wheezes, rales.  Abdominal: Soft. BS +,  no distension, tenderness, rebound or guarding.  Musculoskeletal: Normal range of motion. No edema and no tenderness.  Lymphadenopathy: No lymphadenopathy noted, cervical, inguinal. Neuro: Alert. Normal reflexes, muscle tone coordination. No cranial nerve deficit. Skin: Skin is warm and dry. No rash noted. Not diaphoretic. No erythema. No pallor.  Psychiatric: Normal mood and affect. Behavior, judgment, thought content normal.    ED Course  Procedures (including critical care time)  Labs Reviewed - No data to display No results found.  Hypertension - We have discussed stable blood pressure and normal target range, I have advised regular blood pressure checks and to call his back if the numbers are persistently higher than 140/90 Hyperlipidemia - Patient has requested change to Crestor as insurance will cover that medicine, prescription provided, discussed findings of fasting lipid panel Diabetes, uncontrolled with neuropathy and retinopathy - Patient has appointment scheduled with ophthalmologist in one week, I have strongly advised to keep the appointment - A1c is trending down from 12->10->8.4 (March 4th, 2014) - Patient will continue taking insulin as per home regimen   MDM  Followup on hypertension, hyperlipidemia, diabetes         Theodis Blaze, MD 09/07/12 1625

## 2012-09-07 NOTE — ED Notes (Signed)
Patient states has pain on left side of head As of sat she can not see out of her left eye Has history of mini strokes  Needs medication refills as well Also would like lab results

## 2012-09-07 NOTE — ED Notes (Signed)
Referral faxed to [facility]- waiting for appt

## 2012-09-20 ENCOUNTER — Other Ambulatory Visit: Payer: Self-pay | Admitting: Family Medicine

## 2012-09-20 DIAGNOSIS — Z1231 Encounter for screening mammogram for malignant neoplasm of breast: Secondary | ICD-10-CM

## 2012-09-21 ENCOUNTER — Emergency Department (INDEPENDENT_AMBULATORY_CARE_PROVIDER_SITE_OTHER)
Admission: EM | Admit: 2012-09-21 | Discharge: 2012-09-21 | Disposition: A | Discharge status: Home Health | Payer: No Typology Code available for payment source | Source: Home / Self Care

## 2012-09-21 DIAGNOSIS — E119 Type 2 diabetes mellitus without complications: Secondary | ICD-10-CM

## 2012-09-21 DIAGNOSIS — I10 Essential (primary) hypertension: Secondary | ICD-10-CM

## 2012-09-21 DIAGNOSIS — E785 Hyperlipidemia, unspecified: Secondary | ICD-10-CM

## 2012-09-21 MED ORDER — ROSUVASTATIN CALCIUM 20 MG PO TABS: 20.0000 mg | ORAL_TABLET | Freq: Every day | ORAL | Status: DC

## 2012-09-21 MED ORDER — GLIPIZIDE 10 MG PO TABS: 10.0000 mg | ORAL_TABLET | Freq: Two times a day (BID) | ORAL | Status: DC

## 2012-09-21 MED ORDER — METFORMIN HCL 1000 MG PO TABS: 1000.0000 mg | ORAL_TABLET | Freq: Two times a day (BID) | ORAL | Status: DC

## 2012-09-21 NOTE — ED Provider Notes (Signed)
History     CSN: [id number]  Arrival date & time 09/21/12  28   First MD Initiated Contact with Patient 09/21/12 1558      No chief complaint on file.   (Consider location/radiation/quality/duration/timing/severity/associated sxs/prior treatment) HPI Patient is 52 year old female with hypertension, diabetes, hyperlipidemia who presents for regular followup. She explains that she cannot afford insulin and would like to come off of it. She says she has been controlling her diet and is trying to exercise regularly. She denies chest pain or shortness of breath, no systemic concerns, no fevers or chills, no abdominal or urinary concerns. Past Medical History  Diagnosis Date  . Diabetes mellitus   . Hypertension   . TIA (transient ischemic attack) 03-12-11  . Hyperlipidemia   . 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 [name]     No past surgical history on file.  Family History  Problem Relation Age of Onset  . Stroke Mother   . Diabetes Mother     History  Substance Use Topics  . Smoking status: Current Every Day Smoker -- 0.50 packs/day for 32 years    Types: Cigarettes  . Smokeless tobacco: Never Used     Comment: or less  . Alcohol Use: No    OB History   Grav Para Term Preterm Abortions TAB SAB Ect Mult Living                  Review of Systems  Constitutional: Negative for fever, chills, diaphoresis, activity change, appetite change and fatigue.  HENT: Negative for ear pain, nosebleeds, congestion, facial swelling, rhinorrhea, neck pain, neck stiffness and ear discharge.   Eyes: Negative for pain, discharge, redness, itching and visual disturbance.  Respiratory: Negative for cough, choking, chest tightness, shortness of breath, wheezing and stridor.   Cardiovascular: Negative for chest pain, palpitations and leg swelling.  Gastrointestinal: Negative for abdominal distention.  Genitourinary: Negative for dysuria, urgency, frequency, hematuria, flank  pain, decreased urine volume, difficulty urinating and dyspareunia.  Musculoskeletal: Negative for back pain, joint swelling, arthralgias and gait problem.  Neurological: Negative for dizziness, tremors, seizures, syncope, facial asymmetry, speech difficulty, weakness, light-headedness, numbness and headaches.  Hematological: Negative for adenopathy. Does not bruise/bleed easily.  Psychiatric/Behavioral: Negative for hallucinations, behavioral problems, confusion, dysphoric mood, decreased concentration and agitation.    Allergies  Hydrocodone  Home Medications   Current Outpatient Rx  Name  Route  Sig  Dispense  Refill  . aspirin 81 MG tablet   Oral   Take 1 tablet (81 mg total) by mouth daily.   30 tablet   12   . desloratadine (CLARINEX) 5 MG tablet   Oral   Take 1 tablet (5 mg total) by mouth daily.   30 tablet   3   . fenofibrate 160 MG tablet   Oral   Take 1 tablet (160 mg total) by mouth daily.   30 tablet   6   . glipiZIDE (GLUCOTROL) 10 MG tablet   Oral   Take 1 tablet (10 mg total) by mouth 2 (two) times daily before a meal.   60 tablet   3   . metFORMIN (GLUCOPHAGE) 1000 MG tablet   Oral   Take 1 tablet (1,000 mg total) by mouth 2 (two) times daily with a meal.   60 tablet   3   . Multiple Vitamin (MULTIVITAMIN) tablet   Oral   Take 1 tablet by mouth daily.           Marland Kitchen  olmesartan (BENICAR) 20 MG tablet   Oral   Take 1 tablet (20 mg total) by mouth daily.   30 tablet   3   . pregabalin (LYRICA) 50 MG capsule   Oral   Take 1 capsule (50 mg total) by mouth daily.   30 capsule   4   . rosuvastatin (CRESTOR) 20 MG tablet   Oral   Take 1 tablet (20 mg total) by mouth daily.   30 tablet   3     BP 149/84  Pulse 100  Temp(Src) 98.2 F (36.8 C)  Physical Exam Constitutional: Appears well-developed and well-nourished. No distress.  HENT: Normocephalic. External right and left ear normal. Oropharynx is clear and moist.  Eyes: Conjunctivae  and EOM are normal. PERRLA, no scleral icterus.  Neck: Normal ROM. Neck supple. No JVD. No tracheal deviation. No thyromegaly.  CVS: RRR, S1/S2 +, no murmurs, no gallops, no carotid bruit.  Pulmonary: Effort and breath sounds normal, no stridor, rhonchi, wheezes, rales.  Abdominal: Soft. BS +,  no distension, tenderness, rebound or guarding.  Musculoskeletal: Normal range of motion. No edema and no tenderness.  Lymphadenopathy: No lymphadenopathy noted, cervical, inguinal. Neuro: Alert. Normal reflexes, muscle tone coordination. No cranial nerve deficit. Skin: Skin is warm and dry. No rash noted. Not diaphoretic. No erythema. No pallor.  Psychiatric: Normal mood and affect. Behavior, judgment, thought content normal.    ED Course  Procedures (including critical care time)  Labs Reviewed - No data to display No results found.   1. DIABETES MELLITUS, TYPE II - given financial difficulties agree with discontinuing Lantus since last A1c checked was 8. I have advised patient to continue monitoring CBGs, continue taking metformin and glipizide, exercise regularly. I advised her to come back in 2 months at which point we'll recheck A1c and decide if insulin is needed.   2. HYPERLIPIDEMIA - we'll provide prescription for statin  3. HYPERTENSION - continue monitoring blood pressure regularly, I have advised patient to check blood pressure regularly and call us back if numbers are higher than 140/90       MDM  Diabetes type 2, hyperlipidemia and hypertension        Theodis Blaze, MD 09/21/12 [id number]

## 2012-10-14 ENCOUNTER — Ambulatory Visit (HOSPITAL_COMMUNITY): Payer: No Typology Code available for payment source

## 2012-11-17 ENCOUNTER — Encounter: Payer: Self-pay | Admitting: Family Medicine

## 2012-11-17 ENCOUNTER — Ambulatory Visit (INDEPENDENT_AMBULATORY_CARE_PROVIDER_SITE_OTHER): Payer: No Typology Code available for payment source | Admitting: Family Medicine

## 2012-11-17 VITALS — BP 160/85 | HR 108 | Temp 98.8°F | Ht <= 58 in | Wt 93.0 lb

## 2012-11-17 DIAGNOSIS — E119 Type 2 diabetes mellitus without complications: Secondary | ICD-10-CM

## 2012-11-17 DIAGNOSIS — H3320 Serous retinal detachment, unspecified eye: Secondary | ICD-10-CM | POA: Insufficient documentation

## 2012-11-17 DIAGNOSIS — H547 Unspecified visual loss: Secondary | ICD-10-CM

## 2012-11-17 DIAGNOSIS — I10 Essential (primary) hypertension: Secondary | ICD-10-CM

## 2012-11-17 MED ORDER — LOVASTATIN 40 MG PO TABS: 40.0000 mg | ORAL_TABLET | Freq: Every day | ORAL | Status: DC

## 2012-11-17 MED ORDER — LISINOPRIL-HYDROCHLOROTHIAZIDE 20-25 MG PO TABS: 1.0000 | ORAL_TABLET | Freq: Every day | ORAL | Status: DC

## 2012-11-17 NOTE — Assessment & Plan Note (Signed)
Elevated today, but out of medication. Will start on Lisinopril-HCTZ since she has DM. If patient is not able to tolerate it ,she will let me know. RTC in 6 weeks for BP recheck and labs.

## 2012-11-17 NOTE — Patient Instructions (Signed)
It was nice to meet you today.   I have sent in a prescription for Lovastatin 40mg  daily and Lisinopril-HCTZ for your blood pressure.  Please call Dr. Jenne Campus to arrange for a nutrition appointment.  Make an appointment at the front desk to see Dr. Valentina Lucks to discuss diabetes and insulin options.  I will see you back in 6-8 weeks for check up and we will check your labs then as well.  Kalon Erhardt M. Paula Busenbark, M.D.

## 2012-11-17 NOTE — Assessment & Plan Note (Signed)
Doctor at [facility] requires nutrition, DM education and better control of DM. Will refer to Dr. Jenne Campus for nutrition and Dr. Valentina Lucks for education. I will be managing her diabetes but at this point in time she states she cannot afford insulin. Will await recommendations for Dr. Valentina Lucks. RTC in 6 weeks.

## 2012-11-17 NOTE — Assessment & Plan Note (Signed)
Uncontrolled. Continue Metformin and Glipizide. Will send to Dr. Valentina Lucks and Dr. Jenne Campus. Follow up in 6 weeks. Will need labs at that time including UA and lipid panel. Pt to be mindful of her intake and keep track of her blood sugars at home.

## 2012-11-17 NOTE — Progress Notes (Signed)
Patient ID: SAJDAH TUNE, female   DOB: 1960/09/18, 51 y.o.   MRN: [id number]  Ruffin. Hairford, MD Phone: [phone]   Subjective: HPI: Patient is a 51 y.o. female presenting to clinic today for new patient appointment. Patient was previously seen at [facility] and Dr. Sarajane Jews.   Concerns today include her eyes  1. Eye problem- Seen at [facility] for blurred vision. Has been getting laser eye treatments, and was told she had hemorrhage behind her eyes L>R. She has another procedure scheduled on June 11 at 2:00. They are requesting a new A1C, visiting the dietician.   2. Diabetes:  High at home: 269 Low at home: 75 Taking medications: Metformin and Glipizide, not missed any doses Side effects: None ROS: denies fever, chills, dizziness, LOC, polyuria, polydipsia, numbness or tingling in extremities or chest pain. Last eye exam: Followed by [facility], last a few weeks ago Last foot exam: within the last year at Dr. Sarajane Jews (Dec 2013) Nephropathy screen indicated?: Last Cr was in march 2014, Cr was 0.44 Last flu, zoster and/or pneumovax: UTD  3. Hypertension Blood pressure today: 160/85 Taking Meds: Not currently ROS: Denies headache, visual changes, nausea, vomiting, chest pain, abdominal pain or shortness of breath.  History Reviewed: former smoker.  Health Maintenance:  Tdap- Unsure Pap smear- 2012, never abnormal Colonoscopy- Never Flu shot- Fall 2013 Pneumonia- Fall 2013 Mammogram- 2012 (not in our computer); awaiting appt at [facility]  ROS: Please see HPI above.  Objective: Office vital signs reviewed. BP 160/85  Pulse 108  Temp(Src) 98.8 F (37.1 C) (Oral)  Ht 4\' 7"  (1.397 m)  Wt 93 lb (42.185 kg)  BMI 21.62 kg/m2  LMP 11/17/2012  Physical Examination:  General: Awake, alert. NAD. Sister-in-law at bedside (answering majority of questions) HEENT: Atraumatic, normocephalic. MMM. Dental caries Neck: No masses palpated. No  LAD Pulm: CTAB, no wheezes Cardio: RRR, no murmurs appreciated Abdomen:+BS, soft, nontender, nondistended Extremities: No edema. Moves all extremities Neuro: Grossly intact  Assessment: 52 y.o. female new patient appointment  Plan: See Problem List and After Visit Summary

## 2012-11-26 ENCOUNTER — Ambulatory Visit (HOSPITAL_COMMUNITY)
Admission: RE | Admit: 2012-11-26 | Discharge: 2012-11-26 | Disposition: A | Discharge status: Home / Self Care | Payer: No Typology Code available for payment source | Source: Ambulatory Visit | Attending: Family Medicine | Admitting: Family Medicine

## 2012-11-26 ENCOUNTER — Encounter: Payer: Self-pay | Admitting: Pharmacist

## 2012-11-26 ENCOUNTER — Telehealth: Payer: Self-pay | Admitting: Family Medicine

## 2012-11-26 ENCOUNTER — Ambulatory Visit (INDEPENDENT_AMBULATORY_CARE_PROVIDER_SITE_OTHER): Payer: No Typology Code available for payment source | Admitting: Pharmacist

## 2012-11-26 VITALS — BP 138/77 | HR 106 | Ht <= 58 in | Wt 91.0 lb

## 2012-11-26 DIAGNOSIS — Z1231 Encounter for screening mammogram for malignant neoplasm of breast: Secondary | ICD-10-CM | POA: Insufficient documentation

## 2012-11-26 DIAGNOSIS — E119 Type 2 diabetes mellitus without complications: Secondary | ICD-10-CM

## 2012-11-26 MED ORDER — LOVASTATIN 40 MG PO TABS: 40.0000 mg | ORAL_TABLET | Freq: Every day | ORAL | Status: DC

## 2012-11-26 MED ORDER — PREGABALIN 50 MG PO CAPS: 50.0000 mg | ORAL_CAPSULE | Freq: Two times a day (BID) | ORAL | Status: DC

## 2012-11-26 MED ORDER — INSULIN GLARGINE 100 UNIT/ML SOLOSTAR PEN: 10.0000 [IU] | PEN_INJECTOR | Freq: Every morning | SUBCUTANEOUS | Status: DC

## 2012-11-26 MED ORDER — INSULIN GLARGINE 100 UNIT/ML SOLOSTAR PEN: 10.0000 [IU] | PEN_INJECTOR | Freq: Every day | SUBCUTANEOUS | Status: DC

## 2012-11-26 MED ORDER — ROSUVASTATIN CALCIUM 20 MG PO TABS: 20.0000 mg | ORAL_TABLET | Freq: Every day | ORAL | Status: DC

## 2012-11-26 MED ORDER — METFORMIN HCL 1000 MG PO TABS: 1000.0000 mg | ORAL_TABLET | Freq: Two times a day (BID) | ORAL | Status: DC

## 2012-11-26 MED ORDER — GLIPIZIDE 10 MG PO TABS: 10.0000 mg | ORAL_TABLET | Freq: Two times a day (BID) | ORAL | Status: DC

## 2012-11-26 NOTE — Patient Instructions (Addendum)
Your prescriptions for metformin, lovastatin and glipizide have been sent to [facility] on [address]. Start taking Lantus pen- 10 units every morning. Take blood sugars 2-3 times a day Pay attention to how you feel when your blood sugar is low and why it may have been low- diet will be important here Follow up with Pamala Hurry on 6/13, then [facility]

## 2012-11-26 NOTE — Progress Notes (Signed)
  Subjective:    Patient ID: [name] Michael, female    DOB: 1961/04/04, 52 y.o.   MRN: [id number]  HPI  Accompanied by sister-in-law. History of long term uncontrolled diabetes. Does not express symptoms with hypoglycemia. Meal schedule is erratic.   Review of Systems     Objective:   Physical Exam     Assessment & Plan:   Diabetes of many yrs duration currently under poor control of blood glucose based on   Lab Results  Component Value Date   HGBA1C 9.2 11/17/2012    ,home fasting CBG readings of 30-400s and random CBG readings of 200-300s. Control is suboptimal due to financial issues, missing doses of medications. Patient has hypoglycemic events recorded, however she is unable to verbalize how she feels when this happens.  Instructed her to eat something when she sees these numbers. Able to verbalize appropriate hypoglycemia management plan. Gave samples of basal insulin Lantus (insulin glargine) 10units in the morning. Written patient instructions provided.  Follow up in with Dr. Sheral Apley June 30th.   Total time in face to face counseling 30 minutes.  Patient seen with Verna Czech, PharmD Resident

## 2012-11-26 NOTE — Telephone Encounter (Signed)
Called pharmacy and the prescription written for patient was for a 3 month supply of Mevacor.  The price for 3 months is $71.83.  The price for one month is $25.43.  Attempted x 3 to return patients phone call to relay the information, but no answer.  LM for patient to call back to relay the information.  Markevious Ehmke, Loralyn Freshwater, [place]

## 2012-11-26 NOTE — Assessment & Plan Note (Signed)
Diabetes of many yrs duration currently under poor control of blood glucose based on   Lab Results  Component Value Date   HGBA1C 9.2 11/17/2012    ,home fasting CBG readings of 30-400s and random CBG readings of 200-300s. Control is suboptimal due to financial issues, missing doses of medications. Patient has hypoglycemic events recorded, however she is unable to verbalize how she feels when this happens.  Instructed her to eat something when she sees these numbers. Able to verbalize appropriate hypoglycemia management plan. Gave samples of basal insulin Lantus (insulin glargine) 10units in the morning. Written patient instructions provided.  Follow up in with Dr. Sheral Apley June 30th.   Total time in face to face counseling 30 minutes.  Patient seen with Verna Czech, PharmD Resident

## 2012-11-26 NOTE — Telephone Encounter (Signed)
Patient is needing her cholesterol medicine changed to a different mg because it is $70.00 for a month supply as it is written now.  Please call so they can explain what the pharmacy told them.

## 2012-11-26 NOTE — Telephone Encounter (Signed)
Received call for [name] Michael at [place].  She states [name] called in three prescription for [name] Michael.  One was for Lyrica 50 mg to take one tablet two times a day #180 with 3 refills.   [facility] is unable to order Lyrica due to it being a controlled substance.  They do have samples of Neurontin than can give patient if the doctor is willing to change it prescription to that.  Spoke with [name] and he advise I send Dr. Sheral Apley the message to review.  [name]

## 2012-11-29 MED ORDER — GABAPENTIN 100 MG PO CAPS: 100.0000 mg | ORAL_CAPSULE | Freq: Three times a day (TID) | ORAL | Status: DC

## 2012-11-29 NOTE — Progress Notes (Signed)
Patient ID: [name] Michael, female   DOB: Aug 18, 1960, 52 y.o.   MRN: [id number] Reviewed: Agree with Dr. Graylin Shiver documentation and management.

## 2012-11-29 NOTE — Telephone Encounter (Signed)
It appears her cholesterol medication was changed to Crestor, hopefully that will be covered by [facility]. I think Neurontin would be fine. I will fax over a Rx for Neurontin 100mg  TID, #90/3R.  Thank you! Joaquin Knebel M. Graviela Nodal, M.D.

## 2012-11-30 ENCOUNTER — Other Ambulatory Visit: Payer: Self-pay | Admitting: Family Medicine

## 2012-11-30 DIAGNOSIS — R928 Other abnormal and inconclusive findings on diagnostic imaging of breast: Secondary | ICD-10-CM

## 2012-12-20 ENCOUNTER — Encounter: Payer: Self-pay | Admitting: Family Medicine

## 2012-12-20 ENCOUNTER — Ambulatory Visit (INDEPENDENT_AMBULATORY_CARE_PROVIDER_SITE_OTHER): Payer: No Typology Code available for payment source | Admitting: Family Medicine

## 2012-12-20 VITALS — BP 106/74 | HR 118 | Ht <= 58 in | Wt 91.0 lb

## 2012-12-20 DIAGNOSIS — I10 Essential (primary) hypertension: Secondary | ICD-10-CM

## 2012-12-20 DIAGNOSIS — R928 Other abnormal and inconclusive findings on diagnostic imaging of breast: Secondary | ICD-10-CM | POA: Insufficient documentation

## 2012-12-20 DIAGNOSIS — E785 Hyperlipidemia, unspecified: Secondary | ICD-10-CM

## 2012-12-20 DIAGNOSIS — E119 Type 2 diabetes mellitus without complications: Secondary | ICD-10-CM

## 2012-12-20 LAB — CBC
HCT: 34.1 % — ABNORMAL LOW (ref 36.0–46.0)
Hemoglobin: 11.6 g/dL — ABNORMAL LOW (ref 12.0–15.0)
MCV: 74.6 fL — ABNORMAL LOW (ref 78.0–100.0)
RBC: 4.57 MIL/uL (ref 3.87–5.11)
RDW: 16.7 % — ABNORMAL HIGH (ref 11.5–15.5)
WBC: 9.6 10*3/uL (ref 4.0–10.5)

## 2012-12-20 LAB — BASIC METABOLIC PANEL
BUN: 14 mg/dL (ref 6–23)
CO2: 25 mEq/L (ref 19–32)
Chloride: 96 mEq/L (ref 96–112)
Creat: 0.56 mg/dL (ref 0.50–1.10)
Glucose, Bld: 188 mg/dL — ABNORMAL HIGH (ref 70–99)

## 2012-12-20 LAB — LIPID PANEL: LDL Cholesterol: 34 mg/dL (ref 0–99)

## 2012-12-20 MED ORDER — INSULIN GLARGINE 100 UNIT/ML SOLOSTAR PEN: 10.0000 [IU] | PEN_INJECTOR | Freq: Every day | SUBCUTANEOUS | Status: DC

## 2012-12-20 NOTE — Assessment & Plan Note (Signed)
Difficult to tell how her CBG are doing. Encouraged to bring her numbers to future visits. Given Lantus sample today. Continue other medications. F/u with me in 2 months for A1C.

## 2012-12-20 NOTE — Assessment & Plan Note (Signed)
BP at goal. Tolerating medication with no problem. Continue current regimen and follow up in 2 months.

## 2012-12-20 NOTE — Assessment & Plan Note (Signed)
Lipid panel today. Continue Lovastatin since she has been on this. Consider [facility] if needed for Crestor.

## 2012-12-20 NOTE — Assessment & Plan Note (Signed)
Encouraged to arrange diagnostic mammo. I will assist her if needed, patient agrees.

## 2012-12-20 NOTE — Patient Instructions (Addendum)
It was good to see you. Good job on your blood pressure!  We will call you with your lab results, if there is anything abnormal.  Please call [facility] to arrange your mammogram. This is important. I can order it for you, if they need me to.  I will see you back in 2 months to check your A1C.  Margaretmary Prisk M. Rhya Shan, M.D.

## 2012-12-20 NOTE — Progress Notes (Signed)
Patient ID: [name] Michael, female   DOB: 1961/06/21, 52 y.o.   MRN: [id number]  [place]. Joliyah Lippens, MD Phone: [phone]   Subjective: HPI: Patient is a 52 y.o. female presenting to clinic today for follow up appointment. She is with her sister-in-law. Concerns today include mammogram showed a possible right breast mass  1. Hypertension Blood pressure today: 106/74 Taking Meds: Lisinopril-HCTZ Side effects: None ROS: Denies headache, visual changes, nausea, vomiting, chest pain, abdominal pain or shortness of breath.  2. Diabetes: Patient met with Dr. Valentina Lucks. Was started on Lantus at that time. Meeting with Dr. Jenne Campus tomorrow. Overall, she feels better. High at home: 404 Low at home: 64 Taking medications: Lantus 10mg , Glipizide, Metformin Side effects: None. Unable to states if she has any lows ROS: denies fever, chills, dizziness, LOC, polyuria, polydipsia, numbness or tingling in extremities or chest pain.  3. HLD: On Lovastatin, patient states it is less expensive at [facility]. Has not started on Crestor, and is confused about what she is supposed to take. Meeting with Dr. Jenne Campus tomorrow. Has not had breakfast today.  4. Breast mass: Seen on screening mammo. [facility] has attempted to get in touch with her to schedule a diagnostic mammo but she is not comfortable with that. Her sister died of breast cancer as well as her aunt.   History Reviewed: Former smoker. Health Maintenance: UTD, needs lipid panel  ROS: Please see HPI above.  Objective: Office vital signs reviewed. There were no vitals taken for this visit.  Physical Examination:  General: Awake, alert. NAD. Not very talkative, appears down HEENT: Atraumatic, normocephalic. Poor dentition Pulm: CTAB, no wheezes Cardio: RRR, no murmurs appreciated Abdomen: soft, nontender, nondistended Extremities: No edema Neuro: Grossly intact  Assessment: 52 y.o. female follow  up  Plan: See Problem List and After Visit Summary

## 2012-12-21 ENCOUNTER — Encounter: Payer: Self-pay | Admitting: Family Medicine

## 2012-12-21 ENCOUNTER — Ambulatory Visit (INDEPENDENT_AMBULATORY_CARE_PROVIDER_SITE_OTHER): Payer: No Typology Code available for payment source | Admitting: Family Medicine

## 2012-12-21 VITALS — Ht <= 58 in | Wt 92.1 lb

## 2012-12-21 DIAGNOSIS — E119 Type 2 diabetes mellitus without complications: Secondary | ICD-10-CM

## 2012-12-21 NOTE — Patient Instructions (Addendum)
-   Diet Recommendations for Diabetes   Starchy (carb) foods include: Bread, rice, pasta, potatoes, corn, crackers, bagels, muffins, all baked goods.  - If you want cereal sometimes, choose one with at least 5 grams of fiber per serving.    Protein foods include: Meat, fish, poultry, eggs, dairy foods, and beans such as pinto and kidney beans (beans also provide carbohydrate).   1. Eat at least 3 meals and 1-2 snacks per day. Never go more than 4-5 hours while awake without eating.   - Breakfast ideas:  High-fiber cereal with cow's milk or soymilk (for protein); eggs with 1-2 slices whole-wheat toast; cheese toast; yogurt with fruit and mixed nuts; [place] sandwich; dinner leftovers;     oatmeal w/ blueberries.   2. Limit starchy foods to TWO per meal and ONE per snack. ONE portion of a starchy  food is equal to the following:   - ONE slice of bread (or its equivalent, such as half of a hamburger bun).   - 1/2 cup of a "scoopable" starchy food such as potatoes or rice.   - 1 OUNCE (28 grams) of starchy snack foods such as crackers or pretzels (look on label).   - 15 grams of carbohydrate as shown on food label.  3. Both lunch and dinner should include a protein food, a carb food, and vegetables.   - Obtain twice as many veg's as protein or carbohydrate foods for both lunch and dinner.   - Try to keep frozen veg's on hand for a quick vegetable serving.     - Fresh or frozen veg's are best.  4. Breakfast should always include protein.   5. Keep a daily food record, including time of eating, how much, and what you eat.    - Check out [employer] website.    - Dr. Jenne [id number].

## 2012-12-21 NOTE — Progress Notes (Signed)
Medical Nutrition Therapy:  Appt start time: 1500 end time:  1600.  Assessment:  Primary concerns today: Blood sugar control.  Shaterria came with her sister, with whom she is now living, and who also has DM.  They asked many Qs about foods and BG control.   Usual eating pattern includes 3 meals and 2 snacks per day. Usual physical activity includes walking 15 min 3 X day, usually 7 days a week since 2008.   Kaylaann brought food records from the last few weeks, although they did not include time of day or quantities.   Frequent foods include 16 oz caf-free diet sodas, 16 oz diet sweet decaf tea, chx, beef, fish, [place].  Avoided foods include pineapple (allergy).    FBG has been running from ~100 to 200s.    24-hr recall: (Up at 7 AM; [facility] appt at 8:30 for fasting blood draw) B (9:30 AM)-   Apple cinnamon bar, [employer] sausage biscuit, 16 oz Coke Zero Snk ( AM)-   none Snk (12 PM)-  1/2 c canteloupe Snk ( PM)-  none D ( PM)-  1/2 sweet potato, cinnamon butter (3 g CHO, 2 g fat), 2-3 oz steak, small salad, 1 tbsp shredded chs, 1-2 tbsp ranch dressing, 1/2 c mixed veg's, 1 roll Snk ( PM)-  none  Progress Towards Goal(s):  In progress.   Nutritional Diagnosis:  NI-5.8.2 Excessive carbohydrate intake As related to meals and snacks .  As evidenced by carb-heavy meals and few vegetables.    Intervention:  Nutrition education.  Monitoring/Evaluation:  Dietary intake, exercise, BG, and body weight in 6 week(s).  Will see Ms. Orsak in nutrition clinic with Dr. Sheral Apley.

## 2013-01-06 ENCOUNTER — Ambulatory Visit
Admission: RE | Admit: 2013-01-06 | Discharge: 2013-01-06 | Disposition: A | Discharge status: Home / Self Care | Payer: No Typology Code available for payment source | Source: Ambulatory Visit | Attending: Family Medicine | Admitting: Family Medicine

## 2013-01-06 DIAGNOSIS — R928 Other abnormal and inconclusive findings on diagnostic imaging of breast: Secondary | ICD-10-CM

## 2013-01-17 ENCOUNTER — Telehealth: Payer: Self-pay | Admitting: *Deleted

## 2013-01-17 NOTE — Progress Notes (Signed)
Quick Note:  Please inform patient that the radiologists have recommended that patient get a 6 month followup diagnostic mammogram on the right breast in 6 months to verify stability.  Gerlene Fee, MD, CDE, [place], [place]   ______

## 2013-01-17 NOTE — Telephone Encounter (Signed)
01/17/13 Unable to reach patient message left via telephone for patient per radiologist recommend 6 month follow up diagnostic mammogram on right breast. P.Loria Lacina,RN BSN [facility]

## 2013-02-03 ENCOUNTER — Ambulatory Visit: Payer: No Typology Code available for payment source | Admitting: Family Medicine

## 2013-02-24 ENCOUNTER — Encounter: Payer: Self-pay | Admitting: Family Medicine

## 2013-02-24 ENCOUNTER — Ambulatory Visit (INDEPENDENT_AMBULATORY_CARE_PROVIDER_SITE_OTHER): Payer: No Typology Code available for payment source | Admitting: Family Medicine

## 2013-02-24 VITALS — Ht <= 58 in | Wt 92.6 lb

## 2013-02-24 DIAGNOSIS — E119 Type 2 diabetes mellitus without complications: Secondary | ICD-10-CM

## 2013-02-24 NOTE — Patient Instructions (Addendum)
Diet Recommendations for Diabetes   Starchy (carb) foods include: Bread, rice, pasta, potatoes, corn, crackers, bagels, muffins, all baked goods.   Protein foods include: Meat ([place], tuna, chicken, ham, roast beef, hamburger), fish, poultry, eggs, dairy foods, and beans such as pinto and kidney beans (beans also provide carbohydrate).   1. Eat at least 3 meals and 1-2 snacks per day. Never go more than 4-5 hours while awake without eating.  2. Limit starchy foods to TWO per meal and ONE per snack. ONE portion of a starchy  food is equal to the following:   - ONE slice of bread (or its equivalent, such as half of a hamburger bun).   - 1/2 cup of a "scoopable" starchy food such as potatoes or rice.   - 1 OUNCE (28 grams) of starchy snack foods such as crackers or pretzels (look on label).   - 15 grams of carbohydrate as shown on food label.  3. Both lunch and dinner should include a protein food, a carb food, and vegetables.   - Obtain twice as many veg's as protein or carbohydrate foods for both lunch and dinner.   - Try to keep frozen veg's on hand for a quick vegetable serving.     - Fresh or frozen veg's are best.  4. Breakfast should always include protein.    _________________________________________________________________________  Use your goals sheet daily to help keep track of what you are eating. Please bring that in for your next visit with Dr. Jenne Campus. Please make an appointment to see Dr. Jenne Campus on Monday, October 13 at 2:30pm. Make an appointment to see me for regular follow up.

## 2013-02-24 NOTE — Progress Notes (Signed)
Patient ID: [name] Michael, female   DOB: May 18, 1961, 52 y.o.   MRN: [id number]  Nutrition Clinic:  States she needs food log for her meals because she does not know what to eat. Her sister-in-law is at her appointment with her today.  Assessment:  Primary concerns today: blood sugar control.   24-hr recall:  (Up at 7:00 AM) B (7 AM)-  Apple cereal bar (took medicine then), water Snk (9:00 AM)-  Biscuit with cheese, water L (1 PM)- 5 slices of banana, thin layer [employer] mayo, 2 slices of white bread, chips. 1/2 can of Pepsi Snk ( PM)- No snack D (7:15 PM)- 1 fried chicken wing, [employer] hamburger pizza (2 slices). 16 oz decaf iced tea wth Splenda  Snk (9 PM)- took medicines with leftover tea from dinner  States this is somewhat typical of her diet, although her sister-in-law was not able to cook for her yesterday and she spent a majority of the day at another family member's home.  Exercise: walks back and forth to the mailbox 4 times every day. Walks 1.5 miles on the weekend at her sister's house.  She brought in a log of her fasting blood sugars. 81 is lowest fasting, 295 is highest fasting. Not taking the Lantus at this time. (Missed doses because she does not have the medication.) Still taking Metformin BID, and Glipizide BID.   Discussed in detail what is a starch and what is a protein, and encouraged her multiple times to eat protein, starch and veggie at both lunch and dinner. Patient wrote down notes as we were talking, and this was given to her as a goals sheet as well.   She will follow up with Nutrition Clinic in 4 weeks, and with me sooner than that to discuss her medical problems and do labs.  >50% of 45 min appointment spent with face-to-face counseling.

## 2013-02-25 ENCOUNTER — Other Ambulatory Visit: Payer: Self-pay | Admitting: Family Medicine

## 2013-02-25 ENCOUNTER — Telehealth: Payer: Self-pay | Admitting: *Deleted

## 2013-02-25 MED ORDER — INSULIN GLARGINE 100 UNIT/ML SOLOSTAR PEN: 10.0000 [IU] | PEN_INJECTOR | Freq: Every morning | SUBCUTANEOUS | Status: DC

## 2013-02-25 NOTE — Telephone Encounter (Signed)
Received a call from the [facility] concerning the insulin for [name] Michael. There is some confusion on whether or not it has been discontinued. They would like to speak to Dr Sheral Apley directly.[name] Michael, Kevin Fenton

## 2013-02-25 NOTE — Telephone Encounter (Signed)
Spoke with [facility] at [phone].  [name] Michael has not had any prescriptions filled there since March of this year. She was previously on Lantus, but they have not had any recent prescriptions.  Will fax new Rx for Lantus 10units daily and Ms. Apsey's sister will be notified.  Jadyn Barge M. Miasia Crabtree, M.D.

## 2013-02-28 ENCOUNTER — Telehealth: Payer: Self-pay | Admitting: Psychology

## 2013-02-28 ENCOUNTER — Emergency Department (HOSPITAL_COMMUNITY): Payer: Self-pay

## 2013-02-28 ENCOUNTER — Encounter (HOSPITAL_COMMUNITY): Payer: Self-pay | Admitting: Emergency Medicine

## 2013-02-28 ENCOUNTER — Telehealth: Payer: Self-pay | Admitting: Family Medicine

## 2013-02-28 ENCOUNTER — Emergency Department (HOSPITAL_COMMUNITY)
Admission: EM | Admit: 2013-02-28 | Discharge: 2013-03-01 | Disposition: A | Discharge status: Home / Self Care | Payer: Self-pay | Attending: Emergency Medicine | Admitting: Emergency Medicine

## 2013-02-28 DIAGNOSIS — Z3202 Encounter for pregnancy test, result negative: Secondary | ICD-10-CM | POA: Insufficient documentation

## 2013-02-28 DIAGNOSIS — R51 Headache: Secondary | ICD-10-CM | POA: Insufficient documentation

## 2013-02-28 DIAGNOSIS — R45851 Suicidal ideations: Secondary | ICD-10-CM | POA: Insufficient documentation

## 2013-02-28 DIAGNOSIS — R45850 Homicidal ideations: Secondary | ICD-10-CM | POA: Insufficient documentation

## 2013-02-28 DIAGNOSIS — Z7982 Long term (current) use of aspirin: Secondary | ICD-10-CM | POA: Insufficient documentation

## 2013-02-28 DIAGNOSIS — E119 Type 2 diabetes mellitus without complications: Secondary | ICD-10-CM | POA: Insufficient documentation

## 2013-02-28 DIAGNOSIS — F32A Depression, unspecified: Secondary | ICD-10-CM | POA: Diagnosis present

## 2013-02-28 DIAGNOSIS — Z8673 Personal history of transient ischemic attack (TIA), and cerebral infarction without residual deficits: Secondary | ICD-10-CM | POA: Insufficient documentation

## 2013-02-28 DIAGNOSIS — Z87891 Personal history of nicotine dependence: Secondary | ICD-10-CM | POA: Insufficient documentation

## 2013-02-28 DIAGNOSIS — I10 Essential (primary) hypertension: Secondary | ICD-10-CM | POA: Insufficient documentation

## 2013-02-28 DIAGNOSIS — Z794 Long term (current) use of insulin: Secondary | ICD-10-CM | POA: Insufficient documentation

## 2013-02-28 DIAGNOSIS — F3289 Other specified depressive episodes: Secondary | ICD-10-CM | POA: Insufficient documentation

## 2013-02-28 DIAGNOSIS — E785 Hyperlipidemia, unspecified: Secondary | ICD-10-CM | POA: Insufficient documentation

## 2013-02-28 DIAGNOSIS — F329 Major depressive disorder, single episode, unspecified: Secondary | ICD-10-CM | POA: Insufficient documentation

## 2013-02-28 DIAGNOSIS — Z79899 Other long term (current) drug therapy: Secondary | ICD-10-CM | POA: Insufficient documentation

## 2013-02-28 LAB — RAPID URINE DRUG SCREEN, HOSP PERFORMED
Barbiturates: NOT DETECTED
Cocaine: NOT DETECTED
Opiates: NOT DETECTED

## 2013-02-28 LAB — COMPREHENSIVE METABOLIC PANEL
AST: 11 U/L (ref 0–37)
Albumin: 3.6 g/dL (ref 3.5–5.2)
Calcium: 10.2 mg/dL (ref 8.4–10.5)
Creatinine, Ser: 0.51 mg/dL (ref 0.50–1.10)
GFR calc non Af Amer: >90 mL/min (ref >90)
Sodium: 134 mEq/L — ABNORMAL LOW (ref 135–145)
Total Protein: 7.2 g/dL (ref 6.0–8.3)

## 2013-02-28 LAB — CBC WITH DIFFERENTIAL/PLATELET
Basophils Absolute: 0 10*3/uL (ref 0.0–0.1)
Basophils Relative: 0 % (ref 0–1)
Eosinophils Absolute: 0.1 10*3/uL (ref 0.0–0.7)
Eosinophils Relative: 1 % (ref 0–5)
HCT: 31.5 % — ABNORMAL LOW (ref 36.0–46.0)
MCHC: 34.3 g/dL (ref 30.0–36.0)
MCV: 78.6 fL (ref 78.0–100.0)
Monocytes Absolute: 0.6 10*3/uL (ref 0.1–1.0)
Platelets: 491 10*3/uL — ABNORMAL HIGH (ref 150–400)
RDW: 14.3 % (ref 11.5–15.5)

## 2013-02-28 LAB — ETHANOL: Alcohol, Ethyl (B): <11 mg/dL (ref 0–11)

## 2013-02-28 LAB — AMMONIA: Ammonia: 24 umol/L (ref 11–60)

## 2013-02-28 LAB — SALICYLATE LEVEL: Salicylate Lvl: <2 mg/dL — ABNORMAL LOW (ref 2.8–20.0)

## 2013-02-28 MED ORDER — METFORMIN HCL 500 MG PO TABS
1000.0000 mg | ORAL_TABLET | Freq: Two times a day (BID) | ORAL | Status: DC
  Administered 2013-03-01: 1000 mg via ORAL
  Filled 2013-02-28 (×3): qty 2

## 2013-02-28 MED ORDER — ASPIRIN EC 81 MG PO TBEC
81.0000 mg | DELAYED_RELEASE_TABLET | Freq: Every day | ORAL | Status: DC
  Administered 2013-03-01: 81 mg via ORAL
  Filled 2013-02-28: qty 1

## 2013-02-28 MED ORDER — ONDANSETRON HCL 4 MG PO TABS: 4.0000 mg | ORAL_TABLET | Freq: Three times a day (TID) | ORAL | Status: DC | PRN

## 2013-02-28 MED ORDER — NICOTINE 21 MG/24HR TD PT24
21.0000 mg | MEDICATED_PATCH | Freq: Every day | TRANSDERMAL | Status: DC
  Administered 2013-03-01: 21 mg via TRANSDERMAL
  Filled 2013-02-28: qty 1

## 2013-02-28 MED ORDER — ACETAMINOPHEN 325 MG PO TABS: 650.0000 mg | ORAL_TABLET | ORAL | Status: DC | PRN

## 2013-02-28 MED ORDER — LISINOPRIL-HYDROCHLOROTHIAZIDE 20-25 MG PO TABS: 1.0000 | ORAL_TABLET | Freq: Every day | ORAL | Status: DC

## 2013-02-28 MED ORDER — ADULT MULTIVITAMIN W/MINERALS CH
1.0000 | ORAL_TABLET | Freq: Every day | ORAL | Status: DC
  Administered 2013-03-01: 1 via ORAL
  Filled 2013-02-28: qty 1

## 2013-02-28 MED ORDER — GLIPIZIDE 10 MG PO TABS
10.0000 mg | ORAL_TABLET | Freq: Two times a day (BID) | ORAL | Status: DC
  Administered 2013-03-01: 10 mg via ORAL
  Filled 2013-02-28 (×3): qty 1

## 2013-02-28 MED ORDER — IBUPROFEN 200 MG PO TABS: 600.0000 mg | ORAL_TABLET | Freq: Three times a day (TID) | ORAL | Status: DC | PRN

## 2013-02-28 MED ORDER — INSULIN GLARGINE 100 UNIT/ML ~~LOC~~ SOLN
10.0000 [IU] | Freq: Every morning | SUBCUTANEOUS | Status: DC
  Administered 2013-03-01: 10 [IU] via SUBCUTANEOUS
  Filled 2013-02-28: qty 0.1

## 2013-02-28 MED ORDER — LORAZEPAM 1 MG PO TABS: 1.0000 mg | ORAL_TABLET | Freq: Three times a day (TID) | ORAL | Status: DC | PRN

## 2013-02-28 MED ORDER — HYDROCHLOROTHIAZIDE 25 MG PO TABS
25.0000 mg | ORAL_TABLET | Freq: Every day | ORAL | Status: DC
  Administered 2013-03-01: 25 mg via ORAL
  Filled 2013-02-28: qty 1

## 2013-02-28 MED ORDER — SIMVASTATIN 20 MG PO TABS
20.0000 mg | ORAL_TABLET | Freq: Every day | ORAL | Status: DC
  Filled 2013-02-28: qty 1

## 2013-02-28 MED ORDER — ALUM & MAG HYDROXIDE-SIMETH 200-200-20 MG/5ML PO SUSP: 30.0000 mL | ORAL | Status: DC | PRN

## 2013-02-28 MED ORDER — LISINOPRIL 20 MG PO TABS
20.0000 mg | ORAL_TABLET | Freq: Every day | ORAL | Status: DC
  Administered 2013-03-01: 20 mg via ORAL
  Filled 2013-02-28: qty 1

## 2013-02-28 MED ORDER — ZOLPIDEM TARTRATE 5 MG PO TABS: 5.0000 mg | ORAL_TABLET | Freq: Every evening | ORAL | Status: DC | PRN

## 2013-02-28 NOTE — Telephone Encounter (Signed)
Becky called about Elton is really giving them problems. Your threaten to kill people and needs to be seen by mental health. They can no longer care for her and wants her committed or they will be throwing her out. JW

## 2013-02-28 NOTE — ED Provider Notes (Signed)
CSN: [id number]     Arrival date & time 02/28/13  33 History   First MD Initiated Contact with Patient 02/28/13 1846     Chief Complaint  Patient presents with  . Medical clearance for placement    (Consider location/radiation/quality/duration/timing/severity/associated sxs/prior Treatment) The history is provided by the patient and medical records.   Pt with PMH significant for HTN, DM, TIA, HLP, stroke, presents to the ED for medical clearance.  Sister-in-law accompanying pt in the ED states that she has been living with them for the past 6 months but has seen a steady mental decline.  Family states that she has not been doing what they ask (such as not smoking in the house) and is not taking care of herself (taking meds as directed).  Pt states "at times i don't feel like myself."  Sister-in-law states she does not seem "disoriented" but rather having a "mental breakdown".  Pt lost her job and her home, is not working, and does not have any way of supporting herself at this time which is making her depressed.  Pt has expressed SI and HI, specifically towards her family members.  She states "i feel like my life is fucked up and sometimes i want to hurt my family." No specific plan in place.  Family states usually she is very calm and collected with an occasional outburst but nothing extreme. Pt does not have a hx of psychiatric illness, states it runs in the family.  Sister-in-law expresses concern that her and husband can no longer care for her and would like her placed in some kind of facility.  They would like her placed in a facility for stabilization.  Denies any EtOH or illicit drug use.  Med records reviewed-- pt contacted PCP earlier today and was encouraged to have psych evaluation due to SI/HI.    Past Medical History  Diagnosis Date  . Diabetes mellitus   . Hypertension   . TIA (transient ischemic attack) 03-12-11  . Hyperlipidemia   . 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 [name]    Past Surgical History  Procedure Laterality Date  . Tonsillectomy and adenoidectomy      age 89  . Refractive surgery      [facility]   Family History  Problem Relation Age of Onset  . Stroke Mother   . Diabetes Mother   . Stroke Father   . Kidney failure Mother   . Heart failure Mother   . Cancer Sister     Breast- 6's   History  Substance Use Topics  . Smoking status: Former Smoker -- 0.50 packs/day for 32 years    Types: Cigarettes    Quit date: 09/23/2012  . Smokeless tobacco: Never Used     Comment: or less  . Alcohol Use: No   OB History   Grav Para Term Preterm Abortions TAB SAB Ect Mult Living                 Review of Systems  Psychiatric/Behavioral: Positive for suicidal ideas and behavioral problems.  All other systems reviewed and are negative.    Allergies  Hydrocodone  Home Medications   Current Outpatient Rx  Name  Route  Sig  Dispense  Refill  . aspirin 81 MG tablet   Oral   Take 1 tablet (81 mg total) by mouth daily.   30 tablet   12   . glipiZIDE (GLUCOTROL) 10 MG tablet  Oral   Take 1 tablet (10 mg total) by mouth 2 (two) times daily before a meal.   60 tablet   3   . Insulin Glargine (LANTUS SOLOSTAR) 100 UNIT/ML SOPN   Subcutaneous   Inject 10 Units into the skin every morning.   15 mL   1   . lisinopril-hydrochlorothiazide (PRINZIDE,ZESTORETIC) 20-25 MG per tablet   Oral   Take 1 tablet by mouth daily.   90 tablet   3   . lovastatin (MEVACOR) 20 MG tablet   Oral   Take 20 mg by mouth at bedtime.         . metFORMIN (GLUCOPHAGE) 1000 MG tablet   Oral   Take 1 tablet (1,000 mg total) by mouth 2 (two) times daily with a meal.   60 tablet   3   . Multiple Vitamin (MULTIVITAMIN) tablet   Oral   Take 1 tablet by mouth daily.            BP 145/72  Pulse 110  Temp(Src) 99 F (37.2 C) (Oral)  Resp 16  SpO2 95%  Physical Exam  Nursing note and vitals reviewed. Constitutional: She is  oriented to person, place, and time. She appears well-developed and well-nourished. No distress.  HENT:  Head: Normocephalic and atraumatic.  Mouth/Throat: Oropharynx is clear and moist.  Eyes: Conjunctivae and EOM are normal. Pupils are equal, round, and reactive to light.  Neck: Normal range of motion. Neck supple.  Cardiovascular: Normal rate, regular rhythm and normal heart sounds.   Pulmonary/Chest: Effort normal and breath sounds normal. No respiratory distress. She has no wheezes.  Abdominal: Soft. Bowel sounds are normal. There is no tenderness. There is no guarding.  Musculoskeletal: Normal range of motion. She exhibits no edema.  Neurological: She is alert and oriented to person, place, and time. She has normal strength. She displays no tremor. No cranial nerve deficit or sensory deficit. She displays no seizure activity. Gait normal.  CN grossly intact, moves all extremities appropriately without ataxia, no focal neuro deficits or facial droop appreciated  Skin: Skin is warm and dry. She is not diaphoretic.  Psychiatric: Her speech is normal. She is not actively hallucinating. Thought content is not delusional. She exhibits a depressed mood. She expresses homicidal and suicidal ideation. She expresses no suicidal plans and no homicidal plans.  Depressed mood, flat affect; appropriate responses to questioning; SI and HI without plan; no AVH    ED Course  Procedures (including critical care time) Labs Review Labs Reviewed  CBC WITH DIFFERENTIAL - Abnormal; Notable for the following:    Hemoglobin 10.8 (*)    HCT 31.5 (*)    Platelets 491 (*)    All other components within normal limits  COMPREHENSIVE METABOLIC PANEL - Abnormal; Notable for the following:    Sodium 134 (*)    Potassium 3.4 (*)    Chloride 95 (*)    Glucose, Bld 212 (*)    Alkaline Phosphatase 38 (*)    Total Bilirubin 0.2 (*)    All other components within normal limits  SALICYLATE LEVEL - Abnormal; Notable  for the following:    Salicylate Lvl 123456 (*)    All other components within normal limits  GLUCOSE, CAPILLARY - Abnormal; Notable for the following:    Glucose-Capillary 213 (*)    All other components within normal limits  ACETAMINOPHEN LEVEL  AMMONIA  ETHANOL  URINE RAPID DRUG SCREEN (HOSP PERFORMED)  POCT PREGNANCY, URINE   Imaging  Review Ct Head Wo Contrast  02/28/2013   *RADIOLOGY REPORT*  Clinical Data: Headache.  No known injury.  CT HEAD WITHOUT CONTRAST  Technique:  Contiguous axial images were obtained from the base of the skull through the vertex without contrast.  Comparison: 02/28/2013  Findings: There is diffuse patchy low density throughout the subcortical and periventricular white matter consistent with chronic small vessel ischemic change.  There is prominence of the sulci and ventricles consistent with brain atrophy.  There is no evidence for acute brain infarct, hemorrhage or mass.  The mastoid air cells and paranasal sinuses are clear.  The skull is intact.  The right parotid gland lesion is again noted.  This now measures 1.9 cm, image 1/series 2.  Previously this measured 1.7 cm.  IMPRESSION:  1.  Small vessel ischemic change and brain atrophy. 2.  No acute intracranial abnormalities. 3. 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  As mentioned previously this may represent a benign or malignant neoplasm.   Original Report Authenticated By: Kerby Moors, M.D.    MDM   1. Suicidal ideation   2. Homicidal ideation   3. Depression     Pt AAOx3, no focal neuro deficits on physical exam, appropriate responses to questioning, sister-in-law at bedside states she is at baseline.  Labs as above.  CT head negative for acute findings.  Pt medically cleared.  Will need evaluation for SI/HI.  Psych holding orders and home meds placed.  Consulted psych-- they are aware of pt and will see her.  VS stable.  Larene Pickett, PA-C 03/01/13 0100  Larene Pickett,  PA-C 03/01/13 0100

## 2013-02-28 NOTE — Telephone Encounter (Signed)
Spoke with [name] Michael, she will take her to [facility] ED to be evaluated. [name] Michael, [name] Michael

## 2013-02-28 NOTE — ED Notes (Signed)
Pt BIB family members. Pt's sister in law states that she cannot care for pt anymore at home and wants pt "placed somewhere where she can learn to care for herself." Family states pt is "not understanding anything.". They states pt has no where to live after today. They state that pt's PMD told them to bring pt to ED for possible placement in care facility. Pt is a/o x 4 and states that she is agreeable to placement. Pt with no acute distress. Skin warm, dry.

## 2013-02-28 NOTE — Telephone Encounter (Signed)
Bernis Huey called to discuss her sister-in-law [place].  I have had no contact with either one.  I reviewed the chart and noted the recommendation earlier for an evaluation at [facility].  Becky's biggest concern appears to be placement stating that she has to get Angel out of her house today.  Reiterated the recommendation from Dr. Sheral Apley.  Becky voiced an understanding.

## 2013-03-01 ENCOUNTER — Encounter (HOSPITAL_COMMUNITY): Payer: Self-pay | Admitting: Registered Nurse

## 2013-03-01 DIAGNOSIS — F329 Major depressive disorder, single episode, unspecified: Secondary | ICD-10-CM

## 2013-03-01 DIAGNOSIS — F32A Depression, unspecified: Secondary | ICD-10-CM | POA: Diagnosis present

## 2013-03-01 MED ORDER — INSULIN GLARGINE 100 UNIT/ML ~~LOC~~ SOLN: 10.0000 [IU] | Freq: Every day | SUBCUTANEOUS | Status: DC
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
## 2013-03-01 NOTE — Progress Notes (Deleted)
P4CC CL spoke with patient about the [employer]. She is currently a patient at [facility]. Made patient an apt with them to get enrolled with the [facility]. Was able to make her an apt on 9/15.

## 2013-03-01 NOTE — Progress Notes (Signed)
During 9/9//14 [facility] ED visit pt states she can not afford her insulin Rx provided and d/c and without pcp States her orange card needs renewing   CM reviewed EPIC notes and chart review information CM spoke with the pt about [facility] MATCH program ($3 co pay for each Rx through [facility] program, does not include refills, 7 day expiration of [place] letter and choice of pharmacies) Pt agreed to receive assistance from program   CM spoke with pt who confirms self pay [name] resident with no pcp. CM discussed and provided written information for self pay pcps, importance of pcp for f/u care, www.needymeds.org, discounted pharmacies and other [place] resources such as financial assistance, DSS and  health department  Reviewed resources for [employer] self pay pcps like [employer], family medicine at [address], [facility] family practice, general medical clinics, [facility] urgent care plus others, [facility] out patient pharmacies, housing, and other resources in [employer]. Pt voiced understanding and appreciation of resources provided  Pt is eligible for [facility] MATCH program (unable to find pt listed in PDMI per cardholder name inquiry) PDMI information entered. [facility] letter completed and provided to pt.

## 2013-03-01 NOTE — ED Notes (Signed)
Pt belongings in locker 28 

## 2013-03-01 NOTE — Progress Notes (Addendum)
Per discussion with Psychiatric NP, patient psychiatrically stable for discharge home. CSW provided pt with outpatient resources. .No further Clinical Social Work needs, signing off. EDP aware.   Dorathy Kinsman, LCSW [phone]  ED CSW .03/01/2013 1210pm   CSW also provided pt with homeless resources.   Dorathy Kinsman, LCSW [phone]  ED CSW .03/01/2013 1209pm

## 2013-03-01 NOTE — ED Provider Notes (Signed)
Per Cyril Mourning, Education officer, museum, psych states patient stable for discharge. Pt states she feels ready to be discharged. She states she is out of her lantus pen, she takes 10 units in the am. Her PCP is Dr Sheral Apley. She has an appt with her on the 22nd.    Rolland Porter, MD, Abram Sander   Janice Norrie, MD 03/01/13 1332

## 2013-03-01 NOTE — Consult Note (Signed)
Pt discussed, note reviewed and agreed with

## 2013-03-01 NOTE — Progress Notes (Signed)
                  Dear ___________[name] ________________:  [name] Michael have been approved to have your discharge prescriptions filled through our [facility] (Medication Assistance Through [facility]) program. This program allows for a one-time (no refills) 34-day supply of selected medications for a low copay amount.  The copay is $3.00 per prescription. For instance, if you have one prescription, you will pay $3.00; for two prescriptions, you pay $6.00; for three prescriptions, you pay $9.00; and so on.  Only certain pharmacies are participating in this program with [facility]. You will need to select one of the pharmacies from the attached list and take your prescriptions, this letter, and your photo ID to one of the participating pharmacies.   We are excited that you are able to use the [facility] program to get your medications. These prescriptions must be filled within 7 days of hospital discharge or they will no longer be valid for the [facility] program. Should you have any problems with your prescriptions please contact your case management team member at [phone].  Thank you,   [facility]   Participating [place] [address], [place], [place], [address] [place], [place]   [facility]   [address], [place], [place], [place], [place] [address], [place], [place] [address], [place], [place] [address] [place], [place] [address] [place], [place], [place], [place] [address] [place], [place], [place], [place] [name] [place], [place], [place], [place], [place]   [address] [place],  [place], [place], [place] [address], [place], [place] [address], [place], [place] [address] [place], [place], [place]   [address] [place], [place] [employer], [place], [place] [address], [place], [place] [employer], [place], [place], [place], [place] [address] [place], [place]   [place], [place], [place]   [place], [place], [place]   [address], [place], [place], [place], [place], [place], [place]   [place], [place], [place]   [place], [place], [place] [name] [place], [place], [place]   [place], [place], [place], [facility] [address] [place], [place]   For continued medication needs, please contact the [facility] Department at  [phone].

## 2013-03-01 NOTE — ED Provider Notes (Signed)
Medical screening examination/treatment/procedure(s) were performed by non-physician practitioner and as supervising physician I was immediately available for consultation/collaboration.    Dot Lanes, MD 03/01/13 [phone]

## 2013-03-01 NOTE — Progress Notes (Signed)
P4CC CL spoke with patient. Patient [name] Michael Orange Card expired on 8/28. Was able to set patient up with an apt at [facility] for re-enrollment with the [place]. Explained to patient about all information that she needed to bring with her, including notarized letter as proof of income. Apt for patient is on 9/15.

## 2013-03-01 NOTE — ED Notes (Signed)
Patient belongings secured in locker # 49.

## 2013-03-02 NOTE — Progress Notes (Signed)
[facility] ED CM received a voice message left at 1415 from [place], friend pt is staying with after 03/01/13 d/c home.  Pt went to  [facility] [address], [place] [address] on 03/01/13 and was offered a vial and syringes versus a lantus solostar pen.  They were informed by the pharmacist to call [facility] MD to get Rx changed.  CM spoke with Jacqlyn Larsen who states pt is only familiar with the pen not syringes Jacqlyn Larsen is assisting her Pt now living with her but may be going to live with pt's brother because Jacqlyn Larsen (allergic to smoke) and her husband (COPD) unable to tolerate the smoking Jacqlyn Larsen has assisted pt with MAPP program but had compliance issues Becky attempting to assist with orange card renewal through urgent care or adult care center pending compliance issue resolved. Cm discussed the MAPP program again and provided Becky with www.needymeds.org to get lantus patient assistance program application   [id number] CM spoke with Sameer at [facility] to call in solostar lantus pen 10 units qd with 1 refill as per Dr Rolland Porter CM consulted with Dr Dorie Rank Sameer states he was able to correct the Rx the pharmacy had Reports the medication is present at the pharmacy  1559 updated Becky on availability of the solostar pen lantus Answered questions about development testing processes at the community

## 2013-03-07 ENCOUNTER — Ambulatory Visit: Payer: Self-pay

## 2013-03-09 ENCOUNTER — Ambulatory Visit (INDEPENDENT_AMBULATORY_CARE_PROVIDER_SITE_OTHER): Payer: Self-pay | Admitting: Family Medicine

## 2013-03-09 VITALS — BP 110/64 | HR 60 | Temp 98.6°F | Ht <= 58 in | Wt 90.0 lb

## 2013-03-09 DIAGNOSIS — Z659 Problem related to unspecified psychosocial circumstances: Secondary | ICD-10-CM

## 2013-03-09 DIAGNOSIS — E119 Type 2 diabetes mellitus without complications: Secondary | ICD-10-CM

## 2013-03-09 DIAGNOSIS — Z658 Other specified problems related to psychosocial circumstances: Secondary | ICD-10-CM

## 2013-03-09 DIAGNOSIS — E785 Hyperlipidemia, unspecified: Secondary | ICD-10-CM

## 2013-03-09 MED ORDER — INSULIN GLARGINE 100 UNIT/ML SOLOSTAR PEN: 5.0000 [IU] | PEN_INJECTOR | Freq: Every morning | SUBCUTANEOUS | Status: DC

## 2013-03-09 MED ORDER — LOVASTATIN 20 MG PO TABS: 20.0000 mg | ORAL_TABLET | Freq: Every day | ORAL | Status: DC

## 2013-03-09 NOTE — Progress Notes (Signed)
Patient ID: [name] Michael, female   DOB: 05-06-1961, 52 y.o.   MRN: [id number]  [place]. [place], MD Phone: [phone]   Subjective: HPI: Patient is a 52 y.o. female presenting to clinic today for follow up. Her brother and sister-in-law are with her today.  1. Diabetes:  High at home: Only checks blood sugar in the morning. Highest prior to insulin was >300 Low at home: 107 Taking medications: Lantus 10 units in the morning. Side effects: Feels dizzy/lightheaded around lunch time. Improves with eating. Does not check blood sugar.  ROS: denies fever, chills, dizziness, LOC, polyuria, polydipsia, numbness or tingling in extremities or chest pain. Last eye exam: Followed by [facility] Nephropathy screen indicated?: UTD Last flu, zoster and/or pneumovax: Needs flu  2. ED Follow up/social issues She states she went to the ED to get her Lantus. Her brother is here with her today and he states they took her to find her somewhere to go. She current lives with them but she is a "burden" and they would like her to go somewhere else. He states she smokes and it bothers his COPD. Also, they have a concern about financial circumstances. [name] Michael will not work, and does not have [employer]. She has medical bills and they buy groceries. They are having a difficult time with money right now. [name] Michael states she would rather live with her sister but that is not a possibility. ED did not have any further resources to give them.  History Reviewed: Some day smoker.  ROS: Please see HPI above.  Objective: Office vital signs reviewed. BP 110/64  Pulse 60  Temp(Src) 98.6 F (37 C) (Oral)  Ht 4\' 7"  (1.397 m)  Wt 90 lb (40.824 kg)  BMI 20.92 kg/m2  LMP 03/06/2013  Physical Examination:  General: Awake, alert. NAD. Small and thin.  HEENT: Atraumatic, normocephalic Pulm: CTAB, no wheezes Cardio: RRR, no murmurs appreciated Abdomen:+BS, soft, nontender,  nondistended Extremities: No edema Neuro: Grossly intact.  Assessment: 52 y.o. female follow up  Plan: See Problem List and After Visit Summary

## 2013-03-09 NOTE — Patient Instructions (Addendum)
Decrease your Lantus to 5 units every morning. If you feel your blood sugar is low, please check your blood sugar.  Please stop your smoking. You can chew the gum instead.   I will have [name] Michael call you.   Please come back in 4 weeks.  [name] Michael M. Ericka Marcellus, M.D.

## 2013-03-09 NOTE — Assessment & Plan Note (Signed)
Lovastatin refilled.  ?

## 2013-03-09 NOTE — Assessment & Plan Note (Signed)
Patient needs resources for housing, employment and some encouragement to be more independent. Brother/Sis-in-law appear to take good care of her, but I do not know the full extent of how well she is doing at home. Will ask Hunt Oris, CSW to call the sister-in-law at [phone].

## 2013-03-09 NOTE — Assessment & Plan Note (Signed)
A1c 7.8 today. She does report some symptomatic lows while on lantus. Will decrease to 5 units in the morning, and she should check her blood sugar if she feels weak. F/u in 4 weeks.

## 2013-03-10 ENCOUNTER — Telehealth: Payer: Self-pay | Admitting: Clinical
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
## 2013-03-11 ENCOUNTER — Telehealth: Payer: Self-pay | Admitting: Clinical

## 2013-03-11 NOTE — Telephone Encounter (Signed)
Clinical Education officer, museum (CSW) contacted several agencies ([facility] [place], [place], [place] for [employer]) and was able to make contact with a private psychologist, Dr. Mikey College who will be able to perform an IQ test for pt using special funding through [facility]. CSW made aware that depending on pt score pt may need additional test that would not be covered. At this moment psychologist and CSW agree that it is best for pt to simply have an IQ test. CSW has left a message for pt sister in law to provide resource.  Hunt Oris, MSW, [place]

## 2013-03-11 NOTE — Telephone Encounter (Signed)
Clinical Education officer, museum (CSW) received a returned call from pt sister in [employer] [place]. CSW provided her with information for the psychologist and also 3 contact numbers ([facility] [place], [place] for [employer]) for therapist that pt could see to address symptoms of depression or any concerns/challenges pt may have. Becky plans to contact the psychologist today and will speak with pt about seeing a therapist as well. CSW to follow up with [facility] next week.  Hunt Oris, MSW, [place]

## 2013-03-14 ENCOUNTER — Ambulatory Visit: Payer: Self-pay | Admitting: Family Medicine

## 2013-04-06 ENCOUNTER — Ambulatory Visit: Payer: Self-pay | Admitting: Family Medicine

## 2013-04-06 ENCOUNTER — Encounter: Payer: Self-pay | Admitting: Family Medicine

## 2013-06-22 ENCOUNTER — Telehealth: Payer: Self-pay | Admitting: *Deleted

## 2013-06-22 NOTE — Telephone Encounter (Signed)
Attempts were made to contact patient in April and June of 2014 without success. A certified letter was also sent to patient as a request for follow with study site. Patient has not returned telephone calls. I spoke with patients sister, [name] Michael, who stated that patient has not been complaint with taking any medications or following up with physicians regarding her health. Patient is now considered lost to follow up in the [facility] REDUCE trial.

## 2014-07-19 ENCOUNTER — Encounter: Payer: Self-pay | Admitting: Cardiology

## 2016-01-28 ENCOUNTER — Emergency Department (HOSPITAL_COMMUNITY): Payer: Worker's Compensation

## 2016-01-28 ENCOUNTER — Encounter (HOSPITAL_COMMUNITY)
Admission: EM | Disposition: A | Discharge status: Home / Self Care | Payer: Self-pay | Source: Home / Self Care | Attending: Emergency Medicine

## 2016-01-28 ENCOUNTER — Emergency Department (HOSPITAL_COMMUNITY): Payer: Worker's Compensation | Admitting: Anesthesiology

## 2016-01-28 ENCOUNTER — Ambulatory Visit (HOSPITAL_COMMUNITY)
Admission: EM | Admit: 2016-01-28 | Discharge: 2016-01-29 | Disposition: A | Discharge status: Home / Self Care | Payer: Worker's Compensation | Attending: Emergency Medicine | Admitting: Emergency Medicine

## 2016-01-28 ENCOUNTER — Encounter (HOSPITAL_COMMUNITY): Payer: Self-pay | Admitting: Vascular Surgery

## 2016-01-28 DIAGNOSIS — Z87891 Personal history of nicotine dependence: Secondary | ICD-10-CM | POA: Diagnosis not present

## 2016-01-28 DIAGNOSIS — S52612A Displaced fracture of left ulna styloid process, initial encounter for closed fracture: Secondary | ICD-10-CM

## 2016-01-28 DIAGNOSIS — E119 Type 2 diabetes mellitus without complications: Secondary | ICD-10-CM | POA: Insufficient documentation

## 2016-01-28 DIAGNOSIS — W010XXA Fall on same level from slipping, tripping and stumbling without subsequent striking against object, initial encounter: Secondary | ICD-10-CM | POA: Insufficient documentation

## 2016-01-28 DIAGNOSIS — S52572A Other intraarticular fracture of lower end of left radius, initial encounter for closed fracture: Secondary | ICD-10-CM | POA: Insufficient documentation

## 2016-01-28 DIAGNOSIS — S52502A Unspecified fracture of the lower end of left radius, initial encounter for closed fracture: Secondary | ICD-10-CM | POA: Diagnosis present

## 2016-01-28 DIAGNOSIS — Y92511 Restaurant or cafe as the place of occurrence of the external cause: Secondary | ICD-10-CM | POA: Insufficient documentation

## 2016-01-28 DIAGNOSIS — Y9301 Activity, walking, marching and hiking: Secondary | ICD-10-CM | POA: Insufficient documentation

## 2016-01-28 DIAGNOSIS — I10 Essential (primary) hypertension: Secondary | ICD-10-CM | POA: Insufficient documentation

## 2016-01-28 DIAGNOSIS — Y990 Civilian activity done for income or pay: Secondary | ICD-10-CM | POA: Insufficient documentation

## 2016-01-28 DIAGNOSIS — Z8673 Personal history of transient ischemic attack (TIA), and cerebral infarction without residual deficits: Secondary | ICD-10-CM | POA: Diagnosis not present

## 2016-01-28 DIAGNOSIS — Z7982 Long term (current) use of aspirin: Secondary | ICD-10-CM | POA: Diagnosis not present

## 2016-01-28 DIAGNOSIS — Z794 Long term (current) use of insulin: Secondary | ICD-10-CM | POA: Insufficient documentation

## 2016-01-28 DIAGNOSIS — E785 Hyperlipidemia, unspecified: Secondary | ICD-10-CM | POA: Diagnosis not present

## 2016-01-28 DIAGNOSIS — Z79899 Other long term (current) drug therapy: Secondary | ICD-10-CM | POA: Insufficient documentation

## 2016-01-28 DIAGNOSIS — S6990XA Unspecified injury of unspecified wrist, hand and finger(s), initial encounter: Secondary | ICD-10-CM | POA: Diagnosis present

## 2016-01-28 HISTORY — PX: OPEN REDUCTION INTERNAL FIXATION (ORIF) DISTAL RADIAL FRACTURE: SHX5989

## 2016-01-28 LAB — CBC WITH DIFFERENTIAL/PLATELET
Basophils Absolute: 0 10*3/uL (ref 0.0–0.1)
Basophils Relative: 0 %
Eosinophils Absolute: 0.1 10*3/uL (ref 0.0–0.7)
Eosinophils Relative: 1 %
HEMATOCRIT: 40.6 % (ref 36.0–46.0)
Hemoglobin: 13.8 g/dL (ref 12.0–15.0)
LYMPHS ABS: 2.2 10*3/uL (ref 0.7–4.0)
LYMPHS PCT: 30 %
MCH: 27.9 pg (ref 26.0–34.0)
MCHC: 34 g/dL (ref 30.0–36.0)
MCV: 82.2 fL (ref 78.0–100.0)
MONO ABS: 0.4 10*3/uL (ref 0.1–1.0)
MONOS PCT: 5 %
NEUTROS ABS: 4.7 10*3/uL (ref 1.7–7.7)
Neutrophils Relative %: 64 %
Platelets: 352 10*3/uL (ref 150–400)
RBC: 4.94 MIL/uL (ref 3.87–5.11)
RDW: 12.9 % (ref 11.5–15.5)
WBC: 7.4 10*3/uL (ref 4.0–10.5)

## 2016-01-28 LAB — GLUCOSE, CAPILLARY
GLUCOSE-CAPILLARY: 257 mg/dL — AB (ref 65–99)
Glucose-Capillary: 280 mg/dL — ABNORMAL HIGH (ref 65–99)
Glucose-Capillary: 242 mg/dL — ABNORMAL HIGH (ref 65–99)

## 2016-01-28 LAB — BASIC METABOLIC PANEL
ANION GAP: 12 (ref 5–15)
BUN: 16 mg/dL (ref 6–20)
CALCIUM: 9.8 mg/dL (ref 8.9–10.3)
CHLORIDE: 96 mmol/L — AB (ref 101–111)
CO2: 26 mmol/L (ref 22–32)
Creatinine, Ser: 0.43 mg/dL — ABNORMAL LOW (ref 0.44–1.00)
GFR calc Af Amer: >60 mL/min (ref >60)
GFR calc non Af Amer: >60 mL/min (ref >60)
GLUCOSE: 382 mg/dL — AB (ref 65–99)
POTASSIUM: 3.7 mmol/L (ref 3.5–5.1)
Sodium: 134 mmol/L — ABNORMAL LOW (ref 135–145)

## 2016-01-28 SURGERY — OPEN REDUCTION INTERNAL FIXATION (ORIF) DISTAL RADIUS FRACTURE
Anesthesia: General | Site: Arm Lower | Laterality: Left

## 2016-01-28 MED ORDER — MORPHINE SULFATE (PF) 4 MG/ML IV SOLN
4.0000 mg | Freq: Once | INTRAVENOUS | Status: AC
  Administered 2016-01-28: 4 mg via INTRAVENOUS
  Filled 2016-01-28: qty 1

## 2016-01-28 MED ORDER — ROPIVACAINE HCL 5 MG/ML IJ SOLN
INTRAMUSCULAR | Status: DC | PRN
  Administered 2016-01-28: 25 mL via PERINEURAL

## 2016-01-28 MED ORDER — MIDAZOLAM HCL 2 MG/2ML IJ SOLN
INTRAMUSCULAR | Status: AC
  Filled 2016-01-28: qty 2

## 2016-01-28 MED ORDER — PROMETHAZINE HCL 25 MG/ML IJ SOLN: 6.2500 mg | INTRAMUSCULAR | Status: DC | PRN

## 2016-01-28 MED ORDER — LACTATED RINGERS IV SOLN
INTRAVENOUS | Status: DC
  Administered 2016-01-28: 22:00:00 via INTRAVENOUS

## 2016-01-28 MED ORDER — LISINOPRIL-HYDROCHLOROTHIAZIDE 20-25 MG PO TABS: 1.0000 | ORAL_TABLET | Freq: Every day | ORAL | Status: DC

## 2016-01-28 MED ORDER — ADULT MULTIVITAMIN W/MINERALS CH
1.0000 | ORAL_TABLET | Freq: Every day | ORAL | Status: DC
  Administered 2016-01-29: 1 via ORAL
  Filled 2016-01-28 (×2): qty 1

## 2016-01-28 MED ORDER — SODIUM CHLORIDE 0.9 % IV BOLUS (SEPSIS)
500.0000 mL | Freq: Once | INTRAVENOUS | Status: AC
  Administered 2016-01-28: 500 mL via INTRAVENOUS

## 2016-01-28 MED ORDER — ASPIRIN EC 81 MG PO TBEC
81.0000 mg | DELAYED_RELEASE_TABLET | Freq: Every day | ORAL | Status: DC
  Administered 2016-01-29: 81 mg via ORAL
  Filled 2016-01-28: qty 1

## 2016-01-28 MED ORDER — METHOCARBAMOL 500 MG PO TABS
500.0000 mg | ORAL_TABLET | Freq: Four times a day (QID) | ORAL | Status: DC | PRN
  Administered 2016-01-28: 500 mg via ORAL
  Filled 2016-01-28: qty 1

## 2016-01-28 MED ORDER — FENTANYL CITRATE (PF) 100 MCG/2ML IJ SOLN
INTRAMUSCULAR | Status: DC | PRN
  Administered 2016-01-28: 50 ug via INTRAVENOUS

## 2016-01-28 MED ORDER — ONDANSETRON HCL 4 MG PO TABS: 4.0000 mg | ORAL_TABLET | Freq: Four times a day (QID) | ORAL | Status: DC | PRN

## 2016-01-28 MED ORDER — ONDANSETRON HCL 4 MG/2ML IJ SOLN
INTRAMUSCULAR | Status: AC
  Filled 2016-01-28: qty 2

## 2016-01-28 MED ORDER — OXYCODONE-ACETAMINOPHEN 5-325 MG PO TABS: 1.0000 | ORAL_TABLET | ORAL | 0 refills | Status: DC | PRN

## 2016-01-28 MED ORDER — LACTATED RINGERS IV SOLN
INTRAVENOUS | Status: DC
  Administered 2016-01-28: 17:00:00 via INTRAVENOUS

## 2016-01-28 MED ORDER — METHOCARBAMOL 500 MG PO TABS: 500.0000 mg | ORAL_TABLET | Freq: Four times a day (QID) | ORAL | 0 refills | Status: DC

## 2016-01-28 MED ORDER — INSULIN GLARGINE 100 UNIT/ML ~~LOC~~ SOLN: 10.0000 [IU] | Freq: Every day | SUBCUTANEOUS | Status: DC

## 2016-01-28 MED ORDER — CEFAZOLIN SODIUM 1 G IJ SOLR
INTRAMUSCULAR | Status: AC
  Filled 2016-01-28: qty 10

## 2016-01-28 MED ORDER — DOCUSATE SODIUM 100 MG PO CAPS: 100.0000 mg | ORAL_CAPSULE | Freq: Two times a day (BID) | ORAL | 0 refills | Status: DC

## 2016-01-28 MED ORDER — VITAMIN C 500 MG PO TABS: 500.0000 mg | ORAL_TABLET | Freq: Every day | ORAL | 0 refills | Status: DC

## 2016-01-28 MED ORDER — DIPHENHYDRAMINE HCL 25 MG PO CAPS: 25.0000 mg | ORAL_CAPSULE | Freq: Four times a day (QID) | ORAL | Status: DC | PRN

## 2016-01-28 MED ORDER — FENTANYL CITRATE (PF) 100 MCG/2ML IJ SOLN: 25.0000 ug | INTRAMUSCULAR | Status: DC | PRN

## 2016-01-28 MED ORDER — METFORMIN HCL 500 MG PO TABS
1000.0000 mg | ORAL_TABLET | Freq: Two times a day (BID) | ORAL | Status: DC
  Administered 2016-01-29: 1000 mg via ORAL
  Filled 2016-01-28: qty 2

## 2016-01-28 MED ORDER — PHENYLEPHRINE 40 MCG/ML (10ML) SYRINGE FOR IV PUSH (FOR BLOOD PRESSURE SUPPORT)
PREFILLED_SYRINGE | INTRAVENOUS | Status: AC
  Filled 2016-01-28: qty 10

## 2016-01-28 MED ORDER — LISINOPRIL 20 MG PO TABS
20.0000 mg | ORAL_TABLET | Freq: Every day | ORAL | Status: DC
  Administered 2016-01-29: 20 mg via ORAL
  Filled 2016-01-28: qty 1

## 2016-01-28 MED ORDER — OXYCODONE-ACETAMINOPHEN 5-325 MG PO TABS
1.0000 | ORAL_TABLET | ORAL | Status: DC | PRN
Start: 2016-01-28 — End: 2016-01-29
  Administered 2016-01-29: 2 via ORAL
  Filled 2016-01-28: qty 2

## 2016-01-28 MED ORDER — INSULIN GLARGINE 100 UNIT/ML ~~LOC~~ SOLN
5.0000 [IU] | Freq: Every morning | SUBCUTANEOUS | Status: DC
  Administered 2016-01-29: 5 [IU] via SUBCUTANEOUS
  Filled 2016-01-28: qty 0.05

## 2016-01-28 MED ORDER — 0.9 % SODIUM CHLORIDE (POUR BTL) OPTIME
TOPICAL | Status: DC | PRN
  Administered 2016-01-28: 1000 mL

## 2016-01-28 MED ORDER — LACTATED RINGERS IV SOLN
INTRAVENOUS | Status: DC | PRN
  Administered 2016-01-28: 17:00:00 via INTRAVENOUS

## 2016-01-28 MED ORDER — ONDANSETRON HCL 4 MG/2ML IJ SOLN: 4.0000 mg | Freq: Four times a day (QID) | INTRAMUSCULAR | Status: DC | PRN

## 2016-01-28 MED ORDER — ADULT MULTIVITAMIN W/MINERALS CH: 1.0000 | ORAL_TABLET | Freq: Every day | ORAL | Status: DC

## 2016-01-28 MED ORDER — ONDANSETRON HCL 4 MG/2ML IJ SOLN
INTRAMUSCULAR | Status: DC | PRN
  Administered 2016-01-28: 4 mg via INTRAVENOUS

## 2016-01-28 MED ORDER — DEXTROSE 5 % IV SOLN
500.0000 mg | Freq: Four times a day (QID) | INTRAVENOUS | Status: DC | PRN
  Filled 2016-01-28: qty 5

## 2016-01-28 MED ORDER — FENTANYL CITRATE (PF) 100 MCG/2ML IJ SOLN
INTRAMUSCULAR | Status: AC
  Filled 2016-01-28: qty 2

## 2016-01-28 MED ORDER — CEFAZOLIN IN D5W 1 GM/50ML IV SOLN
1.0000 g | Freq: Three times a day (TID) | INTRAVENOUS | Status: DC
  Administered 2016-01-29 (×2): 1 g via INTRAVENOUS
  Filled 2016-01-28 (×3): qty 50

## 2016-01-28 MED ORDER — PRAVASTATIN SODIUM 20 MG PO TABS
20.0000 mg | ORAL_TABLET | Freq: Every day | ORAL | Status: DC
  Filled 2016-01-28: qty 1

## 2016-01-28 MED ORDER — GLIPIZIDE 10 MG PO TABS
10.0000 mg | ORAL_TABLET | Freq: Two times a day (BID) | ORAL | Status: DC
  Administered 2016-01-29: 10 mg via ORAL
  Filled 2016-01-28 (×2): qty 1

## 2016-01-28 MED ORDER — PHENYLEPHRINE HCL 10 MG/ML IJ SOLN
INTRAMUSCULAR | Status: DC | PRN
  Administered 2016-01-28 (×3): 80 ug via INTRAVENOUS

## 2016-01-28 MED ORDER — HYDROCHLOROTHIAZIDE 25 MG PO TABS
25.0000 mg | ORAL_TABLET | Freq: Every day | ORAL | Status: DC
  Administered 2016-01-29: 25 mg via ORAL
  Filled 2016-01-28: qty 1

## 2016-01-28 MED ORDER — SUCCINYLCHOLINE CHLORIDE 200 MG/10ML IV SOSY
PREFILLED_SYRINGE | INTRAVENOUS | Status: DC | PRN
  Administered 2016-01-28: 80 mg via INTRAVENOUS

## 2016-01-28 MED ORDER — CEFAZOLIN IN D5W 1 GM/50ML IV SOLN
1.0000 g | INTRAVENOUS | Status: DC
Start: 2016-01-28 — End: 2016-01-28

## 2016-01-28 MED ORDER — FENTANYL CITRATE (PF) 250 MCG/5ML IJ SOLN
INTRAMUSCULAR | Status: AC
  Filled 2016-01-28: qty 5

## 2016-01-28 MED ORDER — ONDANSETRON HCL 4 MG/2ML IJ SOLN
4.0000 mg | Freq: Once | INTRAMUSCULAR | Status: AC
  Administered 2016-01-28: 4 mg via INTRAVENOUS
  Filled 2016-01-28: qty 2

## 2016-01-28 MED ORDER — MIDAZOLAM HCL 5 MG/5ML IJ SOLN
INTRAMUSCULAR | Status: DC | PRN
  Administered 2016-01-28: 1 mg via INTRAVENOUS

## 2016-01-28 MED ORDER — PROPOFOL 10 MG/ML IV BOLUS
INTRAVENOUS | Status: DC | PRN
  Administered 2016-01-28: 200 mg via INTRAVENOUS

## 2016-01-28 MED ORDER — VITAMIN C 500 MG PO TABS
1000.0000 mg | ORAL_TABLET | Freq: Every day | ORAL | Status: DC
  Administered 2016-01-29: 1000 mg via ORAL
  Filled 2016-01-28: qty 2

## 2016-01-28 MED ORDER — LIDOCAINE 2% (20 MG/ML) 5 ML SYRINGE
INTRAMUSCULAR | Status: DC | PRN
  Administered 2016-01-28: 80 mg via INTRAVENOUS

## 2016-01-28 MED ORDER — BUPIVACAINE-EPINEPHRINE (PF) 0.5% -1:200000 IJ SOLN: INTRAMUSCULAR | Status: DC | PRN

## 2016-01-28 MED ORDER — MORPHINE SULFATE (PF) 2 MG/ML IV SOLN
1.0000 mg | INTRAVENOUS | Status: DC | PRN
  Administered 2016-01-28 – 2016-01-29 (×2): 1 mg via INTRAVENOUS
  Filled 2016-01-28 (×2): qty 1

## 2016-01-28 MED ORDER — CEFAZOLIN IN D5W 1 GM/50ML IV SOLN
INTRAVENOUS | Status: DC | PRN
  Administered 2016-01-28: 1 g via INTRAVENOUS

## 2016-01-28 SURGICAL SUPPLY — 60 items
BANDAGE ELASTIC 3 VELCRO ST LF (GAUZE/BANDAGES/DRESSINGS) ×2 IMPLANT
BANDAGE ELASTIC 4 VELCRO ST LF (GAUZE/BANDAGES/DRESSINGS) ×2 IMPLANT
BIT DRILL 2.2 SS TIBIAL (BIT) ×2 IMPLANT
BLADE SURG ROTATE 9660 (MISCELLANEOUS) IMPLANT
BNDG ESMARK 4X9 LF (GAUZE/BANDAGES/DRESSINGS) ×2 IMPLANT
BNDG GAUZE ELAST 4 BULKY (GAUZE/BANDAGES/DRESSINGS) ×2 IMPLANT
CANISTER SUCTION 2500CC (MISCELLANEOUS) ×2 IMPLANT
CORDS BIPOLAR (ELECTRODE) ×2 IMPLANT
COVER SURGICAL LIGHT HANDLE (MISCELLANEOUS) ×2 IMPLANT
CUFF TOURNIQUET SINGLE 18IN (TOURNIQUET CUFF) ×2 IMPLANT
CUFF TOURNIQUET SINGLE 24IN (TOURNIQUET CUFF) IMPLANT
DECANTER SPIKE VIAL GLASS SM (MISCELLANEOUS) ×2 IMPLANT
DRAPE OEC MINIVIEW 54X84 (DRAPES) ×2 IMPLANT
DRAPE SURG 17X11 SM STRL (DRAPES) ×2 IMPLANT
DRSG ADAPTIC 3X8 NADH LF (GAUZE/BANDAGES/DRESSINGS) ×2 IMPLANT
DVR VOLAR RIM NRW PLATE LEFT (Plate) ×2 IMPLANT
GAUZE SPONGE 4X4 12PLY STRL (GAUZE/BANDAGES/DRESSINGS) ×2 IMPLANT
GAUZE SPONGE 4X4 16PLY XRAY LF (GAUZE/BANDAGES/DRESSINGS) ×2 IMPLANT
GLOVE BIOGEL PI IND STRL 8.5 (GLOVE) ×1 IMPLANT
GLOVE BIOGEL PI INDICATOR 8.5 (GLOVE) ×1
GLOVE SURG ORTHO 8.0 STRL STRW (GLOVE) ×2 IMPLANT
GOWN STRL REUS W/ TWL LRG LVL3 (GOWN DISPOSABLE) ×1 IMPLANT
GOWN STRL REUS W/ TWL XL LVL3 (GOWN DISPOSABLE) ×1 IMPLANT
GOWN STRL REUS W/TWL LRG LVL3 (GOWN DISPOSABLE) ×1
GOWN STRL REUS W/TWL XL LVL3 (GOWN DISPOSABLE) ×1
K-WIRE 1.6 (WIRE) ×1
K-WIRE FX5X1.6XNS BN SS (WIRE) ×1
KIT BASIN OR (CUSTOM PROCEDURE TRAY) ×2 IMPLANT
KIT ROOM TURNOVER OR (KITS) ×2 IMPLANT
KWIRE FX5X1.6XNS BN SS (WIRE) ×1 IMPLANT
NEEDLE HYPO 25X1 1.5 SAFETY (NEEDLE) ×2 IMPLANT
NS IRRIG 1000ML POUR BTL (IV SOLUTION) ×2 IMPLANT
PACK ORTHO EXTREMITY (CUSTOM PROCEDURE TRAY) ×2 IMPLANT
PAD ARMBOARD 7.5X6 YLW CONV (MISCELLANEOUS) ×4 IMPLANT
PAD CAST 4YDX4 CTTN HI CHSV (CAST SUPPLIES) ×1 IMPLANT
PADDING CAST COTTON 4X4 STRL (CAST SUPPLIES) ×1
PADDING CAST SYNTHETIC 4 (CAST SUPPLIES) ×1
PADDING CAST SYNTHETIC 4X4 STR (CAST SUPPLIES) ×1 IMPLANT
PEG LOCKING SMOOTH 2.2X16 (Screw) ×4 IMPLANT
PEG LOCKING SMOOTH 2.2X18 (Peg) ×8 IMPLANT
PLATE VOLAR RIM NRRW DVR LEFT (Plate) ×1 IMPLANT
SCREW LOCK 10X2.7X3 LD THRD (Screw) ×1 IMPLANT
SCREW LOCK 12X2.7X 3 LD (Screw) ×2 IMPLANT
SCREW LOCKING 2.7X10MM (Screw) ×1 IMPLANT
SCREW LOCKING 2.7X11MM (Screw) ×6 IMPLANT
SCREW LOCKING 2.7X12MM (Screw) ×2 IMPLANT
SCREW NONLOCK 2.7X18MM (Screw) ×2 IMPLANT
SLING ARM FOAM STRAP SML (SOFTGOODS) ×2 IMPLANT
SOAP 2 % CHG 4 OZ (WOUND CARE) ×2 IMPLANT
SPONGE GAUZE 4X4 12PLY STER LF (GAUZE/BANDAGES/DRESSINGS) ×2 IMPLANT
SPONGE LAP 4X18 X RAY DECT (DISPOSABLE) ×2 IMPLANT
SUT VIC AB 2-0 FS1 27 (SUTURE) ×2 IMPLANT
SUT VICRYL 4-0 PS2 18IN ABS (SUTURE) ×2 IMPLANT
SUT VICRYL RAPIDE 4/0 PS 2 (SUTURE) ×2 IMPLANT
SYR CONTROL 10ML LL (SYRINGE) IMPLANT
TOWEL OR 17X24 6PK STRL BLUE (TOWEL DISPOSABLE) ×2 IMPLANT
TOWEL OR 17X26 10 PK STRL BLUE (TOWEL DISPOSABLE) ×2 IMPLANT
TUBE CONNECTING 12X1/4 (SUCTIONS) ×2 IMPLANT
WATER STERILE IRR 1000ML POUR (IV SOLUTION) ×2 IMPLANT
YANKAUER SUCT BULB TIP NO VENT (SUCTIONS) IMPLANT

## 2016-01-28 NOTE — Anesthesia Procedure Notes (Signed)
Anesthesia Regional Block:  Supraclavicular block  Pre-Anesthetic Checklist: ,, timeout performed, Correct Patient, Correct Site, Correct Laterality, Correct Procedure, Correct Position, site marked, Risks and benefits discussed,  Surgical consent,  Pre-op evaluation,  At surgeon's request and post-op pain management  Laterality: Left  Prep: chloraprep       Needles:  Injection technique: Single-shot  Needle Type: Echogenic Stimulator Needle     Needle Length: 9cm 9 cm Needle Gauge: 22 and 22 G    Additional Needles:  Procedures: ultrasound guided (picture in chart) Supraclavicular block Narrative:  Injection made incrementally with aspirations every 5 mL.  Performed by: Personally  Anesthesiologist: Catalina Gravel  Additional Notes: Functioning IV was confirmed and monitors were applied.  A 34mm 22ga Arrow echogenic stimulator needle was used. Sterile prep and drape, hand hygiene, and sterile gloves were used.  Negative aspiration and negative test dose prior to incremental administration of local anesthetic. The patient tolerated the procedure well.  Ultrasound guidance: relevent anatomy identified, needle position confirmed, local anesthetic spread visualized around nerve(s), vascular puncture avoided.  Image printed for medical record.

## 2016-01-28 NOTE — Transfer of Care (Signed)
Immediate Anesthesia Transfer of Care Note  Patient: [name] Michael  Procedure(s) Performed: Procedure(s): OPEN REDUCTION INTERNAL FIXATION (ORIF) DISTAL RADIAL FRACTURE (Left)  Patient Location: PACU  Anesthesia Type:General  Level of Consciousness: awake  Airway & Oxygen Therapy: Patient Spontanous Breathing and Patient connected to nasal cannula oxygen  Post-op Assessment: Report given to RN and Post -op Vital signs reviewed and stable  Post vital signs: Reviewed and stable  Last Vitals:  Vitals:   01/28/16 1632 01/28/16 1956  BP: 181/94 (!) 178/87  Pulse: 102 90  Resp: 20 14  Temp: 36.9 C 36.3 C    Last Pain:  Vitals:   01/28/16 1655  TempSrc:   PainSc: 8          Complications: No apparent anesthesia complications

## 2016-01-28 NOTE — ED Triage Notes (Signed)
Pt reports to the ED for eval of left wrist pain following a fall that occurred just PTA. Pt slid at work and fell. She tried to catch herself and hurt her wrist. Swelling noted to left lateral wrist. Denies any head injury or LOC. Pt A&Ox4, resp e/u, and skin warm and dry.

## 2016-01-28 NOTE — H&P (Signed)
[name] Michael is an 55 y.o. female.   Chief Complaint: LEFT DISTAL RADIUS FRACTURE AFTER A FALL HPI: [name] Michael is a 54 y.o. female brought in by ambulance, who presents to the Emergency Department complaining of an injury to the left wrist s/p fall  ~ 1145 this AM. Pt was walking to the bathroom at [employer] when she slipped and fell on wet ground. She landed on her left side. She unable to recall if she reached her L arm to break the fall or landed directly on it.  She report sharp throbbing 10/10 pain throughout her L arm most specific to her L wrist.  Pain is non radiating. She denies LOC, head injury and numbness. Pt is right hand dominant. No alleviating factors noted. Pt last ate ~1000 this am.  Pt is here for surgery for comminuted left distal radius fracture.  Past Medical History:  Diagnosis Date  . Diabetes mellitus   . Hyperlipidemia   . Hypertension   . Stroke [facility]) 04-01-11   left frontal subcortical, saw Dr. [name]   . TIA (transient ischemic attack) 03-12-11    Past Surgical History:  Procedure Laterality Date  . [place]     age 36    Family History  Problem Relation Age of Onset  . Stroke Mother   . Diabetes Mother   . Kidney failure Mother   . Heart failure Mother   . Stroke Father   . Cancer Sister     Breast- 88's   Social History:  reports that she quit smoking about 3 years ago. Her smoking use included Cigarettes. She has a 16.00 pack-year smoking history. She has never used smokeless tobacco. She reports that she does not drink alcohol or use drugs.  Allergies:  Allergies  Allergen Reactions  . Hydrocodone     Trouble breathing     (Not in a hospital admission)  No results found for this or any previous visit (from the past 48 hour(s)). Dg Chest 2 View  Result Date: 01/28/2016 CLINICAL DATA:  Preop EXAM: CHEST  2 VIEW COMPARISON:  None. FINDINGS: Cardiomediastinal silhouette  is unremarkable. No acute infiltrate or pleural effusion. No pulmonary edema. Bony thorax is unremarkable. IMPRESSION: No active cardiopulmonary disease. Electronically Signed   By: Lahoma Crocker M.D.   On: 01/28/2016 15:22   Dg Wrist Complete Left  Result Date: 01/28/2016 CLINICAL DATA:  Status post fall scratch the status post slip and fall on a wet floor today with a left wrist injury. Initial encounter. EXAM: LEFT WRIST - COMPLETE 3+ VIEW COMPARISON:  None. FINDINGS: The patient has a fracture of the distal radius with impaction of approximately 1 cm. The fracture extends the articular surface. Ulnar styloid fracture is identified. There is ulnar positive variance secondary to impaction of the radius. Soft tissue swelling is present about the wrist. IMPRESSION: Impacted and comminuted fracture of the distal left radius extends the articular surface and results ulnar positive variance. Ulnar styloid fracture. Electronically Signed   By: Inge Rise M.D.   On: 01/28/2016 13:44   Dg Hip Unilat W Or Wo Pelvis 2-3 Views Left  Result Date: 01/28/2016 CLINICAL DATA:  Acute left hip pain after slipping on bathroom floor today. EXAM: DG HIP (WITH OR WITHOUT PELVIS) 2-3V LEFT COMPARISON:  None. FINDINGS: There is no evidence of hip fracture or dislocation. There is no evidence of arthropathy or other focal bone  abnormality. IMPRESSION: Normal left hip. Electronically Signed   By: Marijo Conception, M.D.   On: 01/28/2016 15:21    ROS: NO RECENT ILLNESSES OR HOSPITALIZATIONS  Blood pressure 147/89, pulse 101, temperature 98.3 F (36.8 C), temperature source Oral, resp. rate 20, SpO2 99 %. Physical Exam  General Appearance:  Alert, cooperative, no distress, appears stated age  Head:  Normocephalic, without obvious abnormality, atraumatic  Eyes:  Pupils equal, conjunctiva/corneas clear,         Throat: Lips, mucosa, and tongue normal; teeth and gums normal  Neck: No visible masses     Lungs:    respirations unlabored  Chest Wall:  No tenderness or deformity  Heart:  Regular rate and rhythm,  Abdomen:   Soft, non-tender,         Extremities: [name]: SKIN INTACT FINGERS WARM WELL PERFUSED ABLE TO EXTEND THUMB AND WIGGLE FINGERS FINGERS WARM WELL PERFUSED LIMITED WRIST AND FOREARM MOBILITY  Pulses: 2+ and symmetric  Skin: Skin color, texture, turgor normal, no rashes or lesions     Neurologic: Normal    Assessment/Plan LEFT COMMINUTED INTRAARTICULAR DISTAL RADIUS FRACTURE, DISPLACED  LEFT WRIST OPEN REDUCTION AND INTERNAL FIXATION AND REPAIR AS INDICATED  R/B/A DISCUSSED WITH PT IN HOSPITAL.  PT VOICED UNDERSTANDING OF PLAN CONSENT SIGNED DAY OF SURGERY PT SEEN AND EXAMINED PRIOR TO OPERATIVE PROCEDURE/DAY OF SURGERY SITE MARKED. QUESTIONS ANSWERED WILL REMAIN OBSERVATION FOLLOWING SURGERY  WE ARE PLANNING SURGERY FOR YOUR UPPER EXTREMITY. THE RISKS AND BENEFITS OF SURGERY INCLUDE BUT NOT LIMITED TO BLEEDING INFECTION, DAMAGE TO NEARBY NERVES ARTERIES TENDONS, FAILURE OF SURGERY TO ACCOMPLISH ITS INTENDED GOALS, PERSISTENT SYMPTOMS AND NEED FOR FURTHER SURGICAL INTERVENTION. WITH THIS IN MIND WE WILL PROCEED. I HAVE DISCUSSED WITH THE PATIENT THE PRE AND POSTOPERATIVE REGIMEN AND THE DOS AND DON'TS. PT VOICED UNDERSTANDING AND INFORMED CONSENT SIGNED.  Linna Hoff 01/28/2016, [id number]

## 2016-01-28 NOTE — ED Notes (Signed)
PT transported to PACU  Via streatcher.

## 2016-01-28 NOTE — Brief Op Note (Signed)
01/28/2016  4:30 PM  PATIENT:  [name] Michael  56 y.o. female  PRE-OPERATIVE DIAGNOSIS:  Left Distal Radius Fx  POST-OPERATIVE DIAGNOSIS:  * No post-op diagnosis entered *  PROCEDURE:  Procedure(s): OPEN REDUCTION INTERNAL FIXATION (ORIF) DISTAL RADIAL FRACTURE (Left)  SURGEON:  Surgeon(s) and Role:    * Iran Planas, MD - Primary  PHYSICIAN ASSISTANT:   ASSISTANTS: none   ANESTHESIA:   general  EBL:  No intake/output data recorded.  BLOOD ADMINISTERED:none  DRAINS: none   LOCAL MEDICATIONS USED:  MARCAINE     SPECIMEN:  No Specimen  DISPOSITION OF SPECIMEN:  N/A  COUNTS:  YES  TOURNIQUET:  * No tourniquets in log *  DICTATION: .Other Dictation: Dictation Number [id number]  PLAN OF CARE: Admit for overnight observation  PATIENT DISPOSITION:  PACU - hemodynamically stable.   Delay start of Pharmacological VTE agent (>24hrs) due to surgical blood loss or risk of bleeding: not applicable

## 2016-01-28 NOTE — Progress Notes (Signed)
Dr. Gifford Shave made aware of multiple CBGs. Orders received and carried out as documented.

## 2016-01-28 NOTE — Anesthesia Procedure Notes (Signed)
Procedure Name: Intubation Date/Time: 01/28/2016 6:46 PM Performed by: Manus Gunning, Miquela Costabile J Pre-anesthesia Checklist: Patient identified, Emergency Drugs available, Suction available, Patient being monitored and Timeout performed Patient Re-evaluated:Patient Re-evaluated prior to inductionOxygen Delivery Method: Circle system utilized Preoxygenation: Pre-oxygenation with 100% oxygen Intubation Type: IV induction and Cricoid Pressure applied Ventilation: Mask ventilation without difficulty Laryngoscope Size: Glidescope Grade View: Grade II Tube type: Oral Tube size: 7.0 mm Number of attempts: 2 Placement Confirmation: ETT inserted through vocal cords under direct vision,  breath sounds checked- equal and bilateral and positive ETCO2 Secured at: 20 cm Tube secured with: Tape Dental Injury: Teeth and Oropharynx as per pre-operative assessment  Difficulty Due To: Difficult Airway- due to anterior larynx and Difficult Airway- due to limited oral opening Future Recommendations: Recommend- induction with short-acting agent, and alternative techniques readily available

## 2016-01-28 NOTE — Anesthesia Postprocedure Evaluation (Signed)
Anesthesia Post Note  Patient: [name] Michael  Procedure(s) Performed: Procedure(s) (LRB): OPEN REDUCTION INTERNAL FIXATION (ORIF) DISTAL RADIAL FRACTURE (Left)  Patient location during evaluation: PACU Anesthesia Type: General Level of consciousness: awake and alert Pain management: pain level controlled Vital Signs Assessment: post-procedure vital signs reviewed and stable Respiratory status: spontaneous breathing, nonlabored ventilation, respiratory function stable and patient connected to nasal cannula oxygen Cardiovascular status: blood pressure returned to baseline and stable Postop Assessment: no signs of nausea or vomiting Anesthetic complications: no    Last Vitals:  Vitals:   01/28/16 2030 01/28/16 2041  BP: (!) 165/93 (!) 170/89  Pulse: 95 87  Resp: 17 14  Temp:  36.4 C    Last Pain:  Vitals:   01/28/16 2041  TempSrc:   PainSc: 0-No pain                 Jereme Loren A

## 2016-01-28 NOTE — ED Notes (Signed)
Patient changed into gown and given warm blanket.

## 2016-01-28 NOTE — ED Notes (Signed)
Ice pack applied to left wrist

## 2016-01-28 NOTE — Anesthesia Preprocedure Evaluation (Addendum)
Anesthesia Evaluation  Patient identified by MRN, date of birth, ID band Patient awake    Reviewed: Allergy & Precautions, NPO status , Patient's Chart, lab work & pertinent test results  Airway Mallampati: II  TM Distance: <3 FB Neck ROM: Full    Dental  (+) Dental Advisory Given, Poor Dentition, Missing   Pulmonary former smoker,    Pulmonary exam normal breath sounds clear to auscultation       Cardiovascular hypertension, Normal cardiovascular exam Rhythm:Regular Rate:Normal     Neuro/Psych PSYCHIATRIC DISORDERS Depression TIACVA    GI/Hepatic negative GI ROS, Neg liver ROS,   Endo/Other  diabetes, Poorly Controlled, Type 2  Renal/GU negative Renal ROS     Musculoskeletal negative musculoskeletal ROS (+)   Abdominal   Peds  Hematology negative hematology ROS (+)   Anesthesia Other Findings Day of surgery medications reviewed with the patient.  Patient only takes ASA 81mg  due to insurance problems.  Reproductive/Obstetrics                            Anesthesia Physical Anesthesia Plan  ASA: III  Anesthesia Plan: General   Post-op Pain Management:  Regional for Post-op pain   Induction: Intravenous  Airway Management Planned: Oral ETT  Additional Equipment:   Intra-op Plan:   Post-operative Plan: Extubation in OR  Informed Consent: I have reviewed the patients History and Physical, chart, labs and discussed the procedure including the risks, benefits and alternatives for the proposed anesthesia with the patient or authorized representative who has indicated his/her understanding and acceptance.   Dental advisory given  Plan Discussed with: CRNA  Anesthesia Plan Comments: (Risks/benefits of general anesthesia discussed with patient including risk of damage to teeth, lips, gum, and tongue, nausea/vomiting, allergic reactions to medications, and the possibility of heart  attack, stroke and death.  All patient questions answered.  Patient wishes to proceed.)       Anesthesia Quick Evaluation

## 2016-01-28 NOTE — Discharge Instructions (Signed)
KEEP BANDAGE CLEAN AND DRY °CALL OFFICE FOR F/U APPT [phone] in 14 days °KEEP HAND ELEVATED ABOVE HEART °OK TO APPLY ICE TO OPERATIVE AREA °CONTACT OFFICE IF ANY WORSENING PAIN OR CONCERNS. °

## 2016-01-28 NOTE — ED Provider Notes (Signed)
[facility] DEPT Provider Note   CSN: [id number] Arrival date & time: 01/28/16  1308  First Provider Contact:   First MD Initiated Contact with Patient 01/28/16 1419     By signing my name below, I, [name] Michael, attest that this documentation has been prepared under the direction and in the presence of non-physician practitioner, Domenic Moras, PA-C. Electronically Signed: Evelene Michael, Scribe. 01/28/2016. 2:45 PM.   History   Chief Complaint Chief Complaint  Patient presents with  . Wrist Injury     The history is provided by the patient. No language interpreter was used.    HPI Comments:  [name] Michael is a 55 y.o. female brought in by ambulance, who presents to the Emergency Department complaining of an injury to the left wrist s/p fall  ~ 1145 this AM. Pt was walking to the bathroom at [facility] when she slipped and fell on wet ground. She landed on her left side. She unable to recall if she reached her L arm to break the fall or landed directly on it.  She report sharp throbbing 10/10 pain throughout her L arm most specific to her L wrist.  Pain is non radiating. She denies LOC, head injury and numbness. Pt is right hand dominant. No alleviating factors noted. Pt last ate ~1000 this am.   Past Medical History:  Diagnosis Date  . Diabetes mellitus   . Hyperlipidemia   . Hypertension   . Stroke [facility]) 04-01-11   left frontal subcortical, saw Dr. [name]   . TIA (transient ischemic attack) 03-12-11    Patient Active Problem List   Diagnosis Date Noted  . Poor social situation 03/09/2013  . Depression 03/01/2013  . Abnormal mammogram 12/20/2012  . Decreased vision 11/17/2012  . DIABETES MELLITUS, TYPE II 02/09/2007  . HYPERLIPIDEMIA 02/09/2007  . HYPERTENSION 02/09/2007  . GERD 02/09/2007    Past Surgical History:  Procedure Laterality Date  . [place]     age 72    OB History    No data  available       Home Medications    Prior to Admission medications   Medication Sig Start Date End Date Taking? Authorizing Provider  aspirin 81 MG tablet Take 1 tablet (81 mg total) by mouth daily. 08/24/12   Reyne Dumas, MD  glipiZIDE (GLUCOTROL) 10 MG tablet Take 1 tablet (10 mg total) by mouth 2 (two) times daily before a meal. 11/26/12   Zenia Resides, MD  Insulin Glargine (LANTUS SOLOSTAR) 100 UNIT/ML SOPN Inject 5 Units into the skin every morning. 03/09/13   Amber Fidel Levy, MD  insulin glargine (LANTUS) 100 UNIT/ML injection Inject 0.1 mLs (10 Units total) into the skin daily. 03/01/13   Rolland Porter, MD  lisinopril-hydrochlorothiazide (PRINZIDE,ZESTORETIC) 20-25 MG per tablet Take 1 tablet by mouth daily. 11/17/12   Amber Fidel Levy, MD  lovastatin (MEVACOR) 20 MG tablet Take 1 tablet (20 mg total) by mouth at bedtime. 03/09/13   Amber Fidel Levy, MD  metFORMIN (GLUCOPHAGE) 1000 MG tablet Take 1 tablet (1,000 mg total) by mouth 2 (two) times daily with a meal. 11/26/12   Zenia Resides, MD  Multiple Vitamin (MULTIVITAMIN) tablet Take 1 tablet by mouth daily.      Historical Provider, MD    Family History Family History  Problem Relation Age of Onset  . Stroke Mother   . Diabetes Mother   . Kidney failure  Mother   . Heart failure Mother   . Stroke Father   . Cancer Sister     Breast- 63's    Social History Social History  Substance Use Topics  . Smoking status: Former Smoker    Packs/day: 0.50    Years: 32.00    Types: Cigarettes    Quit date: 09/23/2012  . Smokeless tobacco: Never Used     Comment: or less  . Alcohol use No     Allergies   Hydrocodone   Review of Systems Review of Systems  Musculoskeletal: Positive for arthralgias and myalgias.  Neurological: Negative for syncope and numbness.     Physical Exam Updated Vital Signs BP 148/91 (BP Location: Right Arm)   Pulse 106   Temp 98.3 F (36.8 C) (Oral)   Resp 16   SpO2 96%   Physical Exam    Constitutional: She is oriented to person, place, and time. She appears well-developed and well-nourished. No distress.  HENT:  Head: Normocephalic and atraumatic.  Eyes: Conjunctivae are normal.  Cardiovascular: Normal rate.   Pulmonary/Chest: Effort normal.  Musculoskeletal: She exhibits tenderness and deformity.  Diffuse tenderness to left wrist to palpation with crepitus and decreased ROM secondary to pain Closed deformity noted Radial pulses 2+ Sensation intact to distal fingers  Tendeness to left lateral thigh without gross deformity   Neurological: She is alert and oriented to person, place, and time.  Skin: Skin is warm and dry.  Psychiatric: She has a normal mood and affect.  Nursing note and vitals reviewed.    ED Treatments / Results  DIAGNOSTIC STUDIES:  Oxygen Saturation is 96% on RA, normal by my interpretation.    COORDINATION OF CARE:  2:24 PM Discussed treatment plan with pt at bedside and pt agreed to plan.  Labs (all labs ordered are listed, but only abnormal results are displayed) Labs Reviewed  BASIC METABOLIC PANEL  CBC WITH DIFFERENTIAL/PLATELET    EKG  EKG Interpretation None      Date: 01/28/2016  Rate: 101  Rhythm: sinus tachycardia  QRS Axis: rightward axis  Intervals: normal  ST/T Wave abnormalities: normal  Conduction Disutrbances: none  Narrative Interpretation:   Old EKG Reviewed: No significant changes noted     Radiology Dg Wrist Complete Left  Result Date: 01/28/2016 CLINICAL DATA:  Status post fall scratch the status post slip and fall on a wet floor today with a left wrist injury. Initial encounter. EXAM: LEFT WRIST - COMPLETE 3+ VIEW COMPARISON:  None. FINDINGS: The patient has a fracture of the distal radius with impaction of approximately 1 cm. The fracture extends the articular surface. Ulnar styloid fracture is identified. There is ulnar positive variance secondary to impaction of the radius. Soft tissue swelling is  present about the wrist. IMPRESSION: Impacted and comminuted fracture of the distal left radius extends the articular surface and results ulnar positive variance. Ulnar styloid fracture. Electronically Signed   By: Inge Rise M.D.   On: 01/28/2016 13:44    Procedures Procedures   Medications Ordered in ED Medications  morphine 4 MG/ML injection 4 mg (not administered)  ondansetron (ZOFRAN) injection 4 mg (not administered)  sodium chloride 0.9 % bolus 500 mL (not administered)     Initial Impression / Assessment and Plan / ED Course  I have reviewed the triage vital signs and the nursing notes.  Pertinent labs & imaging results that were available during my care of the patient were reviewed by me and considered in my  medical decision making (see chart for details).  Clinical Course    2:48 PM Discussed case with Dr. Apolonio Schneiders (hand surgery) who would like preop work-up including  EKG basic labs and CXR. Will likely take pt to surgery at 6pm tonight  Final Clinical Impressions(s) / ED Diagnoses   Patient X-Ray shows impacted and comminuted fracture of the distal left radius and Ulnar styloid fracture.  Discussed case with Dr. Apolonio Schneiders who will take pt to surgery. Pt aware of plan and is agreeable with above plan. Pt in NAD, vital signs stable in ED.   Final diagnoses:  Distal radius fracture, left, closed, initial encounter  Fracture of ulnar styloid, left, closed, initial encounter    New Prescriptions New Prescriptions   No medications on file    I personally performed the services described in this documentation, which was scribed in my presence. The recorded information has been reviewed and is accurate.        Domenic Moras, PA-C 01/28/16 [place], MD 01/28/16 1620

## 2016-01-29 ENCOUNTER — Encounter (HOSPITAL_COMMUNITY): Payer: Self-pay | Admitting: Orthopedic Surgery

## 2016-01-29 NOTE — Op Note (Signed)
NAMENORVELLE, KULAGA                ACCOUNT NO.:  [id number]  MEDICAL RECORD NO.:  [id number]  LOCATION:  6N11C                        FACILITY:  [place]  PHYSICIAN:  Linna Hoff IV, M.D.DATE OF BIRTH:  05-20-1961  DATE OF PROCEDURE:  01/28/2016 DATE OF DISCHARGE:                              OPERATIVE REPORT   PREOPERATIVE DIAGNOSIS:  Left comminuted intra-articular distal radius fracture 3 or more fragments.  POSTOPERATIVE DIAGNOSIS:  Left comminuted intra-articular distal radius fracture 3 or more fragments.  ATTENDING SURGEON:  Linna Hoff, M.D.  I scrubbed and was present for the entire procedure.  ASSISTANT SURGEON:  None.  ANESTHESIA:  General via endotracheal tube with interscalene block.  PROCEDURE: 1. Open treatment of left wrist intra-articular distal radius fracture     3 or more fragments. 2. Left wrist brachioradialis tendon release and lengthening. 3. Radiographs 3 views, left wrist.  RADIOGRAPHIC INTERPRETATION:  AP, lateral, and oblique films of the wrist did show the volar plate fixation in place and in good position with a __________and positive variance.  SURGICAL IMPLANTS:  DVR Crosslock volar rim plate and narrow plate with distal locking pegs and bicortical screws proximally.  SURGICAL INDICATIONS:  Ms. Barstad is a 55 year old female who was working in [employer] today, she slipped and fell at work and sustained comminuted intra-articular distal radius fracture.  Based on her pain level and the displacement and nature of the injuries, recommended she undergo the above procedure.  Risks, benefits, and alternatives were discussed in detail with the patient.  Signed informed consent was obtained.  Risks include, but not limited to bleeding, infection, damage to nearby nerves, arteries, tendons, nonunion, malunion, hardware failure, loss of motion to wrist and digits, incomplete relief of symptoms, need for further surgical  intervention.  DESCRIPTION OF PROCEDURE:  The patient was properly identified in the preoperative holding area and marked with a permanent marker made on left wrist to indicate the correct operative site.  The patient was then brought back to the operating room, placed supine on the anesthesia table.  General anesthesia was administered.  The patient tolerated this well.  A well-padded tourniquet placed on left brachium and sealed with 1000 drape.  Left upper extremity was then prepped and draped in normal sterile fashion.  Time-out was called.  Correct site was identified, and procedure then began.  Attention then turned to left wrist.  Once the incision made directly over the FCR sheath after the tourniquet was insufflated.  Dissection was carried down through skin and subcutaneous tissue.  The FCR sheath was opened proximally and distally, going through the floor of the FCR.  The FPL was then carefully split laterally and L-shaped pronator quadratus flap was then elevated. Pronator quadratus was then elevated and then the fracture site was then exposed.  This was an intra-articular fracture of 3 or more fragments. Careful open reduction was then carried out in order to reduce the __________ column back out to length.  Brachioradialis was then carefully released and tendon lengthening, tenotomy was then carried out on the brachioradialis freeing it after the radial styloid.  Careful dissection of first dorsal compartment tendons were then carried out. The wound  was then thoroughly irrigated.  Following this, the nerve conduction volar rim plate was then applied given the very distal ulnar column fracture and involvement of the lunate facet.  This was then applied and held in place with a K-wire.  Position confirmed using mini C-arm.  After the plate position was then confirmed, the oblong screw hole was then placed proximally.  Distal fixation was carried out from an ulnar to radial  direction of the distal locking pegs.  Final screw fixation was then carried out proximally with locking shaft screws.  The wound was irrigated.  Final radiographs were then obtained.  The pronator quadratus was then closed with 2-0 Vicryl, subcutaneous tissue was closed with 4-0 Vicryl, and skin closed with 4-0 Vicryl.  Adaptic dressing, sterile compressive bandage were then applied.  The patient was placed in a well-padded sugar-tong splint, extubated, and taken to recovery room in good condition.  POSTPROCEDURE PLAN:  The patient will be admitted overnight for IV antibiotics and pain control, discharged in the morning, seen back in the office in 2 weeks.  X-rays out of the splint, application of short- arm cast, begin a therapy regimen at 4 week mark.  Put the therapy on at the first postoperative visit, total of 4 weeks immobilization and begin a therapy regimen at this point.     Melrose Nakayama, M.D.     FWO/MEDQ  D:  01/28/2016  T:  01/28/2016  Job:  [id number]

## 2016-01-29 NOTE — Progress Notes (Signed)
Met with patient, her sister and case Freight forwarder (who was discussing [employer] and Well[facility]) .  [name] Michael's sister suggested she will buy her sister a meter and pay for the strips and refused the One Touch Verio meter I brought for her. [name] Michael's sister will help her get an appointment at [facility].  Discussed the cost of Metformin and Glipizide and the need to establish care at [place] so the MD can assess diabetes needs.   I have suggested the patient check CBG at least one time a day and bring the sugars to the MD each visit- this will help MD make a more educated decision about medications.   Gentry Fitz, RN, BA, MHA, CDE Diabetes Coordinator Inpatient Diabetes Program  [phone] (Team Pager) [phone] ([place]) 01/29/2016 3:11 PM

## 2016-01-29 NOTE — Progress Notes (Addendum)
Inpatient Diabetes Program Recommendations  AACE/ADA: New Consensus Statement on Inpatient Glycemic Control (2015)  Target Ranges:  Prepandial:   less than 140 mg/dL      Peak postprandial:   less than 180 mg/dL (1-2 hours)      Critically ill patients:  140 - 180 mg/dL   Results for [name] Michael, [name] Michael (MRN [id number]) as of 01/29/2016 12:11  Ref. Range 01/28/2016 17:14 01/28/2016 17:55 01/28/2016 20:00  Glucose-Capillary Latest Ref Range: 65 - 99 mg/dL 280 (H) 242 (H) 257 (H)   Review of Glycemic Control  Diabetes history: DM2 Outpatient Diabetes medications: Glipizide 10 mg BID, Lantus 5 units QAM, Lantus 10 units daily, Metformin 1000 mg BID (all meds have notation on home med list that patient is NOT taking any of them) Current orders for Inpatient glycemic control: Lantus 5 units QAM, Glipizide 10 mg BID, Metformin 1000 mg BID  Inpatient Diabetes Program Recommendations: Correction (SSI): While inpatient, please consider ordering CBGs with Novolog correction scale ACHS. HgbA1C: Please add an A1C to blood in the lab to evaluate glycemic control over the past 2-3 months. Outpatient: Glucose values in the chart have all been greater than 242 mg/dl and according to the home medication list patient is not taking any DM medications. Patient needs to follow up with PCP regarding glycemic control and needs to be taking DM medications especially to improve wound healing and to decrease complications from surgery.   Addendum: Spoke with patient over the phone about diabetes and home regimen for diabetes control. Patient reports that she is not being followed by a doctor for diabetes management and currently she is NOT taking any DM medications as an outpatient for diabetes control. Patient states that she use to go to the [facility] and [facility] in the past and that she last went there over 2 years ago. Patient states that she use to take Lantus, Glipizide, and Metformin but  has not taken any of them in over 2 years because she "can not afford them and has no insurance". Patient reports that she has not been checking her glucose because she does not have a glucometer at home.  Discussed glucose and A1C goals. Discussed importance of checking CBGs and maintaining good CBG control to prevent long-term and short-term complications. Explained how hyperglycemia leads to damage within blood vessels which lead to the common complications seen with uncontrolled diabetes. Stressed to the patient the importance of improving glycemic control to prevent further complications from uncontrolled diabetes especially given that she has just had surgery.  Discussed importance of glycemic control with wound healing and to decrease risk of complications post surgery. Informed patient that she could purchase Glipizide and Metformin at [facility] for $4 each for 30 day supply and patient states that she can afford the $4 for Metformin and Glipizide if prescribed at discharge. Informed patient that diabetes coordinator has a One Touch Verio glucometer which will be provided to patient today. Encouraged patient to check her glucose and to take DM medications as prescribed. Also asked patient to follow up with the [facility] and make an appointment to re-establish care there. Patient verbalized understanding of information discussed and she states that she has no further questions at this time related to diabetes.  MD, at time of discharge if patient is continued on Glipizide and Metformin please provide patient with Rx for: Glipizide and Metformin   Thanks, Barnie Alderman, RN, MSN, CDE Diabetes Coordinator Inpatient Diabetes Program [phone] (Team Pager from  8am to 5pm) [phone] ([place] office) [phone] [facility] office) [phone] [facility] office)

## 2016-01-29 NOTE — Care Management Note (Signed)
Case Management Note  Patient Details  Name: [name] Michael MRN: [id number] Date of Birth: Mar 07, 1961  Subjective/Objective:                   Discussed [facility] with patient's sister [name] Michael at bedside. At times patient spoke but for the most part lied in bed with her eyes closed. [name] Michael will call [place] to schedule appointment and either get a meter there or [facility]. [name] Michael can assist with medications. Patient has gone to [facility] before and used their pharmacy.  Action/Plan:   Expected Discharge Date:                  Expected Discharge Plan:  Home/Self Care  In-House Referral:     Discharge planning Services  CM Consult, [facility]  Post Acute Care Choice:    Choice offered to:  Patient, Sibling  DME Arranged:    DME Agency:     HH Arranged:    [place] Agency:     Status of Service:  Completed, signed off  If discussed at [employer] of Stay Meetings, dates discussed:    Additional Comments:  Marilu Favre, RN 01/29/2016, 2:06 PM

## 2016-01-29 NOTE — Discharge Summary (Signed)
Physician Discharge Summary  Patient ID: [name] Michael MRN: [id number] DOB/AGE: [birth year] 55 y.o.  Admit date: 01/28/2016 Discharge date: 01/29/2016  Admission Diagnoses: Left Distal Radius Fx Past Medical History:  Diagnosis Date  . Diabetes mellitus   . Hyperlipidemia   . Hypertension   . Stroke [facility]) 04-01-11   left frontal subcortical, saw Dr. [name]   . TIA (transient ischemic attack) 03-12-11    Discharge Diagnoses:  Active Problems:   Closed fracture of left distal radius   Surgeries: Procedure(s): OPEN REDUCTION INTERNAL FIXATION (ORIF) DISTAL RADIAL FRACTURE on 01/28/2016    Consultants: none  Discharged Condition: Improved  Hospital Course: KEESA RORICK is an 55 y.o. female who was admitted 01/28/2016 with a chief complaint of Chief Complaint  Patient presents with  . Wrist Injury  , and found to have a diagnosis of Left Distal Radius Fx.  They were brought to the operating room on 01/28/2016 and underwent Procedure(s): OPEN REDUCTION INTERNAL FIXATION (ORIF) DISTAL RADIAL FRACTURE.    They were given perioperative antibiotics: Anti-infectives    Start     Dose/Rate Route Frequency Ordered Stop   01/29/16 0200  ceFAZolin (ANCEF) IVPB 1 g/50 mL premix     1 g 100 mL/hr over 30 Minutes Intravenous Every 8 hours 01/28/16 2113 01/30/16 0159   01/28/16 2115  ceFAZolin (ANCEF) IVPB 1 g/50 mL premix  Status:  Discontinued     1 g 100 mL/hr over 30 Minutes Intravenous NOW 01/28/16 2113 01/28/16 2115    .  They were given sequential compression devices, early ambulation, and Other (comment) ambulation for DVT prophylaxis.  Recent vital signs: Patient Vitals for the past 24 hrs:  BP Temp Temp src Pulse Resp SpO2 Height Weight  01/29/16 1346 (!) 176/94 98.2 F (36.8 C) Oral (!) 105 18 94 % - -  01/29/16 0529 (!) 148/70 98.1 F (36.7 C) Oral 100 17 100 % - -  01/29/16 0234 (!) 171/85 98 F (36.7 C) Oral 82 16 99 % - -  01/28/16 2057 (!) 175/84 97.5 F (36.4 C) Oral 90  16 99 % 4\' 7"  (1.397 m) 41.6 kg (91 lb 11.2 oz)  01/28/16 2041 (!) 170/89 97.5 F (36.4 C) - 87 14 100 % - -  01/28/16 2030 (!) 165/93 - - 95 17 98 % - -  01/28/16 2015 (!) 165/88 - - 94 18 100 % - -  01/28/16 2000 - - - 94 17 96 % - -  01/28/16 1956 (!) 178/87 97.4 F (36.3 C) - 90 14 96 % - -  01/28/16 1632 181/94 98.4 F (36.9 C) Oral 102 20 100 % - -  01/28/16 1534 147/89 - - 101 20 99 % - -  .  Recent laboratory studies: Dg Chest 2 View  Result Date: 01/28/2016 CLINICAL DATA:  Preop EXAM: CHEST  2 VIEW COMPARISON:  None. FINDINGS: Cardiomediastinal silhouette is unremarkable. No acute infiltrate or pleural effusion. No pulmonary edema. Bony thorax is unremarkable. IMPRESSION: No active cardiopulmonary disease. Electronically Signed   By: Lahoma Crocker M.D.   On: 01/28/2016 15:22   Dg Wrist Complete Left  Result Date: 01/28/2016 CLINICAL DATA:  Status post fall scratch the status post slip and fall on a wet floor today with a left wrist injury. Initial encounter. EXAM: LEFT WRIST - COMPLETE 3+ VIEW COMPARISON:  None. FINDINGS: The patient has a fracture of the distal radius with impaction of approximately 1 cm. The fracture extends the articular surface.  Ulnar styloid fracture is identified. There is ulnar positive variance secondary to impaction of the radius. Soft tissue swelling is present about the wrist. IMPRESSION: Impacted and comminuted fracture of the distal left radius extends the articular surface and results ulnar positive variance. Ulnar styloid fracture. Electronically Signed   By: Inge Rise M.D.   On: 01/28/2016 13:44   Dg Hip Unilat W Or Wo Pelvis 2-3 Views Left  Result Date: 01/28/2016 CLINICAL DATA:  Acute left hip pain after slipping on bathroom floor today. EXAM: DG HIP (WITH OR WITHOUT PELVIS) 2-3V LEFT COMPARISON:  None. FINDINGS: There is no evidence of hip fracture or dislocation. There is no evidence of arthropathy or other focal bone abnormality. IMPRESSION:  Normal left hip. Electronically Signed   By: Marijo Conception, M.D.   On: 01/28/2016 15:21      Diagnostic Studies: Dg Chest 2 View  Result Date: 01/28/2016 CLINICAL DATA:  Preop EXAM: CHEST  2 VIEW COMPARISON:  None. FINDINGS: Cardiomediastinal silhouette is unremarkable. No acute infiltrate or pleural effusion. No pulmonary edema. Bony thorax is unremarkable. IMPRESSION: No active cardiopulmonary disease. Electronically Signed   By: Lahoma Crocker M.D.   On: 01/28/2016 15:22   Dg Wrist Complete Left  Result Date: 01/28/2016 CLINICAL DATA:  Status post fall scratch the status post slip and fall on a wet floor today with a left wrist injury. Initial encounter. EXAM: LEFT WRIST - COMPLETE 3+ VIEW COMPARISON:  None. FINDINGS: The patient has a fracture of the distal radius with impaction of approximately 1 cm. The fracture extends the articular surface. Ulnar styloid fracture is identified. There is ulnar positive variance secondary to impaction of the radius. Soft tissue swelling is present about the wrist. IMPRESSION: Impacted and comminuted fracture of the distal left radius extends the articular surface and results ulnar positive variance. Ulnar styloid fracture. Electronically Signed   By: Inge Rise M.D.   On: 01/28/2016 13:44   Dg Hip Unilat W Or Wo Pelvis 2-3 Views Left  Result Date: 01/28/2016 CLINICAL DATA:  Acute left hip pain after slipping on bathroom floor today. EXAM: DG HIP (WITH OR WITHOUT PELVIS) 2-3V LEFT COMPARISON:  None. FINDINGS: There is no evidence of hip fracture or dislocation. There is no evidence of arthropathy or other focal bone abnormality. IMPRESSION: Normal left hip. Electronically Signed   By: Marijo Conception, M.D.   On: 01/28/2016 15:21    They benefited maximally from their hospital stay and there were no complications.     Disposition: 01-Home or [place], MD Follow up in 2 week(s).   Specialty:  Orthopedic  Surgery Contact information: [address] [place] [address] [place]. Schedule an appointment as soon as possible for a visit today.   Contact information: [address] [place] [phone]           Signed: Linna Hoff 01/29/2016, 3:17 PM

## 2016-01-29 NOTE — Progress Notes (Signed)
Discussed discharge summary with patient. Reviewed all medications with patient. Patient didn't receive prescription for pain medication. MD Caralyn Guile paged twice at office but no response. Patient could not wait any longer and left to go home. Told patient that if MD comes by with prescription then I will call her sisters phone and leave at the front desk with Network engineer.

## 2016-02-06 ENCOUNTER — Ambulatory Visit: Payer: Self-pay | Attending: Internal Medicine | Admitting: Internal Medicine

## 2016-02-06 ENCOUNTER — Encounter: Payer: Self-pay | Admitting: Internal Medicine

## 2016-02-06 VITALS — BP 153/80 | HR 104 | Temp 98.2°F | Resp 18 | Ht <= 58 in | Wt 79.4 lb

## 2016-02-06 DIAGNOSIS — E1122 Type 2 diabetes mellitus with diabetic chronic kidney disease: Secondary | ICD-10-CM

## 2016-02-06 DIAGNOSIS — N181 Chronic kidney disease, stage 1: Principal | ICD-10-CM

## 2016-02-06 LAB — LIPID PANEL
CHOL/HDL RATIO: 7 ratio — AB (ref <=5.0)
CHOLESTEROL: 253 mg/dL — AB (ref 125–200)
HDL: 36 mg/dL — AB (ref >=46)
TRIGLYCERIDES: 553 mg/dL — AB (ref <150)

## 2016-02-06 LAB — HEPATIC FUNCTION PANEL
ALK PHOS: 67 U/L (ref 33–130)
ALT: 10 U/L (ref 6–29)
AST: 12 U/L (ref 10–35)
Albumin: 3.6 g/dL (ref 3.6–5.1)
Bilirubin, Direct: 0 mg/dL (ref <=0.2)
Indirect Bilirubin: 0.3 mg/dL (ref 0.2–1.2)
TOTAL PROTEIN: 6.5 g/dL (ref 6.1–8.1)
Total Bilirubin: 0.3 mg/dL (ref 0.2–1.2)

## 2016-02-06 LAB — TSH: TSH: 1.05 mIU/L

## 2016-02-06 MED ORDER — METFORMIN HCL ER 500 MG PO TB24: 500.0000 mg | ORAL_TABLET | Freq: Two times a day (BID) | ORAL | 2 refills | Status: DC

## 2016-02-06 MED ORDER — GLUCOSE BLOOD VI STRP: ORAL_STRIP | 12 refills | Status: DC

## 2016-02-06 MED ORDER — INSULIN GLARGINE 100 UNIT/ML SOLOSTAR PEN: 10.0000 [IU] | PEN_INJECTOR | Freq: Every day | SUBCUTANEOUS | 2 refills | Status: DC

## 2016-02-06 MED ORDER — TRUEPLUS LANCETS 28G MISC: 5 refills | Status: DC

## 2016-02-06 MED ORDER — TRUE METRIX METER W/DEVICE KIT: PACK | 0 refills | Status: DC

## 2016-02-06 MED ORDER — INSULIN PEN NEEDLE 31G X 5 MM MISC: 2 refills | Status: DC

## 2016-02-06 MED FILL — TRUE METRIX TEST STRIP: 25 days supply | Qty: 100 | Fill #0

## 2016-02-06 MED FILL — METFORMIN HCL ER 500 MG TAB: 500 | 30 days supply | Qty: 60 | Fill #0

## 2016-02-06 MED FILL — TRUEplus LANCETS 28G MISC: 25 days supply | Qty: 100 | Fill #0

## 2016-02-06 MED FILL — !TRUE METRIX BLOOD GLUCOSE: 1 days supply | Qty: 1 | Fill #0

## 2016-02-06 NOTE — Progress Notes (Addendum)
Ordered Lantus 10 units daily. Educated patient on the use of the Lantus Solostar Pen and patient was able to demonstrate use. Also ordered True Metrix blood glucose meter and supplies. Reviewed with patient use of meter. All patient questions and concerns addressed. Patient seen with Shellee Milo, PharmD Candidate.

## 2016-02-06 NOTE — Progress Notes (Unsigned)
Post hospital f/u  Recent wrist fx- see dr. Lequita Asal note  She is here for f/u hyperglycemia- reviewed historical labs- DM has been a long term problem- but she has not had f/u. Note A1C in 2012 was 12.9!.  Glucose during recent hospitalization= 382, CBGs in hospital= 200-280  She does not follw a specific diet or exercise plan  Past Medical History:  Diagnosis Date  . Diabetes mellitus   . Hyperlipidemia   . Hypertension   . Stroke [facility]) 04-01-11   left frontal subcortical, saw Dr. [name]   . TIA (transient ischemic attack) 03-12-11    Social History   Social History  . Marital status: Single    Spouse name: N/A  . Number of children: N/A  . Years of education: N/A   Occupational History  . Not on file.   Social History Main Topics  . Smoking status: Former Smoker    Packs/day: 0.50    Years: 32.00    Types: Cigarettes    Quit date: 09/23/2012  . Smokeless tobacco: Never Used     Comment: or less  . Alcohol use No  . Drug use: No  . Sexual activity: No   Other Topics Concern  . Not on file   Social History Narrative   Lives with brother and sister-in-law. Does not work.          Past Surgical History:  Procedure Laterality Date  . OPEN REDUCTION INTERNAL FIXATION (ORIF) DISTAL RADIAL FRACTURE Left 01/28/2016   Procedure: OPEN REDUCTION INTERNAL FIXATION (ORIF) DISTAL RADIAL FRACTURE;  Surgeon: Iran Planas, MD;  Location: [place];  Service: Orthopedics;  Laterality: Left;  . [place]     age 55    Family History  Problem Relation Age of Onset  . Stroke Mother   . Diabetes Mother   . Kidney failure Mother   . Heart failure Mother   . Stroke Father   . Cancer Sister     Breast- 50's    Allergies  Allergen Reactions  . Hydrocodone     Trouble breathing    Current Outpatient Prescriptions on File Prior to Visit  Medication Sig Dispense Refill  . aspirin 81 MG tablet Take 1 tablet  (81 mg total) by mouth daily. 30 tablet 12  . docusate sodium (COLACE) 100 MG capsule Take 1 capsule (100 mg total) by mouth 2 (two) times daily. 30 capsule 0  . glipiZIDE (GLUCOTROL) 10 MG tablet Take 1 tablet (10 mg total) by mouth 2 (two) times daily before a meal. (Patient not taking: Reported on 01/28/2016) 60 tablet 3  . Insulin Glargine (LANTUS SOLOSTAR) 100 UNIT/ML SOPN Inject 5 Units into the skin every morning. (Patient not taking: Reported on 01/28/2016) 15 mL 1  . insulin glargine (LANTUS) 100 UNIT/ML injection Inject 0.1 mLs (10 Units total) into the skin daily. (Patient not taking: Reported on 01/28/2016) 10 mL 1  . lisinopril-hydrochlorothiazide (PRINZIDE,ZESTORETIC) 20-25 MG per tablet Take 1 tablet by mouth daily. (Patient not taking: Reported on 01/28/2016) 90 tablet 3  . lovastatin (MEVACOR) 20 MG tablet Take 1 tablet (20 mg total) by mouth at bedtime. (Patient not taking: Reported on 01/28/2016) 30 tablet 5  . metFORMIN (GLUCOPHAGE) 1000 MG tablet Take 1 tablet (1,000 mg total) by mouth 2 (two) times daily with a meal. (Patient not taking: Reported on 01/28/2016) 60 tablet 3  . methocarbamol (ROBAXIN) 500 MG tablet Take 1  tablet (500 mg total) by mouth 4 (four) times daily. 30 tablet 0  . Multiple Vitamin (MULTIVITAMIN) tablet Take 1 tablet by mouth daily.      Marland Kitchen oxyCODONE-acetaminophen (ROXICET) 5-325 MG tablet Take 1 tablet by mouth every 4 (four) hours as needed for severe pain. 35 tablet 0  . vitamin C (ASCORBIC ACID) 500 MG tablet Take 1 tablet (500 mg total) by mouth daily. 50 tablet 0  . [DISCONTINUED] ramipril (ALTACE) 10 MG capsule Take 1 capsule (10 mg total) by mouth daily. 30 capsule 2   No current facility-administered medications on file prior to visit.      patient denies chest pain, shortness of breath, orthopnea. Denies lower extremity edema, abdominal pain, change in appetite, change in bowel movements. Patient denies rashes, musculoskeletal complaints. No other specific  complaints in a complete review of systems.   There were no vitals taken for this visit.in   NAD Chest CTA CV- REg RATE  ABD- thin  A/p- has DM type 2  Needs meds- reviewed med hx It appears to me that she did well on lantus We will start long acting insulin- Pharmacy to determine type based on availability.  Check labs-- almost certain that she will need ACE- I and statin

## 2016-02-07 LAB — MICROALBUMIN / CREATININE URINE RATIO
Creatinine, Urine: 39 mg/dL (ref 20–320)
MICROALB/CREAT RATIO: 521 ug/mg{creat} — AB (ref <30)
Microalb, Ur: 20.3 mg/dL

## 2016-02-07 LAB — HEMOGLOBIN A1C

## 2016-03-03 ENCOUNTER — Encounter: Payer: Self-pay | Admitting: Family Medicine

## 2016-03-03 ENCOUNTER — Ambulatory Visit: Payer: Self-pay | Attending: Family Medicine | Admitting: Family Medicine

## 2016-03-03 VITALS — BP 140/90 | HR 110 | Temp 98.5°F | Resp 16 | Ht <= 58 in | Wt 80.0 lb

## 2016-03-03 DIAGNOSIS — S52502A Unspecified fracture of the lower end of left radius, initial encounter for closed fracture: Secondary | ICD-10-CM

## 2016-03-03 DIAGNOSIS — E119 Type 2 diabetes mellitus without complications: Secondary | ICD-10-CM

## 2016-03-03 LAB — GLUCOSE, POCT (MANUAL RESULT ENTRY)
POC GLUCOSE: 316 mg/dL — AB (ref 70–99)
POC Glucose: 275 mg/dl — AB (ref 70–99)

## 2016-03-03 MED ORDER — METFORMIN HCL ER 500 MG PO TB24: 1000.0000 mg | ORAL_TABLET | Freq: Two times a day (BID) | ORAL | 2 refills | Status: DC

## 2016-03-03 MED ORDER — INSULIN ASPART 100 UNIT/ML ~~LOC~~ SOLN
6.0000 [IU] | Freq: Once | SUBCUTANEOUS | Status: AC
  Administered 2016-03-03: 6 [IU] via SUBCUTANEOUS

## 2016-03-03 NOTE — Addendum Note (Signed)
Addended by: Kristen Cardinal on: 03/03/2016 12:34 PM   Modules accepted: Orders

## 2016-03-03 NOTE — Progress Notes (Signed)
Establish PCP, refill metformin needed. gen flexeril  Bid for pain.

## 2016-03-03 NOTE — Progress Notes (Signed)
Subjective:  Patient ID: [name] Michael, female    DOB: 04/24/1961  Age: 55 y.o. MRN: [id number]  CC: Follow-up on diabetes mellitus  HPI [name] Michael is a 55 year old female with a history of type 2 diabetes mellitus (A1c 14), left radial fracture (currently wearing a left wrist brace) who is accompanied by her sister (whom she lives with) for an office visit today.  She has been compliant with metformin 500 twice daily but her blood sugar is 316 in the clinic today: She informs me she had grits, a piece of toast and a cookie for breakfast; Lantus appears that her med list however she has not been taking this due to inability to administer this as a result of her fracture. Her sister is concerned that the patient is unable to care for herself; there is a question of a possible form of mental retardation which was never diagnosed as the family members the patient decided breeze have since passed on. She would like her to go to a group home or some form of skilled nursing.  She is unsure of how to use her glucometer or test her blood sugars and has no idea of what she should be eating.   Past Medical History:  Diagnosis Date  . Diabetes mellitus   . Hyperlipidemia   . Hypertension   . Stroke [facility]) 04-01-11   left frontal subcortical, saw Dr. [name]   . TIA (transient ischemic attack) 03-12-11    Past Surgical History:  Procedure Laterality Date  . OPEN REDUCTION INTERNAL FIXATION (ORIF) DISTAL RADIAL FRACTURE Left 01/28/2016   Procedure: OPEN REDUCTION INTERNAL FIXATION (ORIF) DISTAL RADIAL FRACTURE;  Surgeon: Iran Planas, MD;  Location: [place];  Service: Orthopedics;  Laterality: Left;  . [place] ADENOIDECTOMY     age 51    Allergies  Allergen Reactions  . Hydrocodone     Trouble breathing     Outpatient Medications Prior to Visit  Medication Sig Dispense Refill  . aspirin 81 MG tablet Take 1 tablet (81 mg total) by  mouth daily. 30 tablet 12  . Multiple Vitamin (MULTIVITAMIN) tablet Take 1 tablet by mouth daily.      . metFORMIN (GLUCOPHAGE XR) 500 MG 24 hr tablet Take 1 tablet (500 mg total) by mouth 2 (two) times daily with a meal. 60 tablet 2  . Blood Glucose Monitoring Suppl (TRUE METRIX METER) w/Device KIT Use as directed 1 kit 0  . glucose blood (TRUE METRIX BLOOD GLUCOSE TEST) test strip Use as instructed 100 each 12  . Insulin Pen Needle 31G X 5 MM MISC Use as directed 100 each 2  . TRUEPLUS LANCETS 28G MISC Use as directed 100 each 5  . Insulin Glargine (LANTUS SOLOSTAR) 100 UNIT/ML Solostar Pen Inject 10 Units into the skin daily. (Patient not taking: Reported on 03/03/2016) 5 pen 2   No facility-administered medications prior to visit.     ROS Review of Systems  Constitutional: Negative for activity change, appetite change and fatigue.  HENT: Negative for congestion, sinus pressure and sore throat.   Eyes: Negative for visual disturbance.  Respiratory: Negative for cough, chest tightness, shortness of breath and wheezing.   Cardiovascular: Negative for chest pain and palpitations.  Gastrointestinal: Negative for abdominal distention, abdominal pain and constipation.  Endocrine: Negative for polydipsia.  Genitourinary: Negative for dysuria and frequency.  Musculoskeletal:       See hpi  Skin: Negative for rash.  Neurological: Negative for tremors, light-headedness and numbness.  Hematological: Does not bruise/bleed easily.  Psychiatric/Behavioral: Negative for agitation and behavioral problems.    Objective:  BP 140/90 (BP Location: Right Arm, Patient Position: Sitting, Cuff Size: Normal)   Pulse (!) 110   Temp 98.5 F (36.9 C) (Oral)   Resp 16   Ht 4' 6.5" (1.384 m)   Wt 80 lb (36.3 kg)   SpO2 96%   BMI 18.94 kg/m   BP/Weight 03/03/2016 09/30/7351 08/01/9240  Systolic BP 683 419 622  Diastolic BP 90 80 94  Wt. (Lbs) 80 79.4 -  BMI 18.94 18.79 -      Physical Exam    Constitutional: She is oriented to person, place, and time. She appears well-developed and well-nourished.  Cardiovascular: Normal rate, normal heart sounds and intact distal pulses.   No murmur heard. Pulmonary/Chest: Effort normal and breath sounds normal. She has no wheezes. She has no rales. She exhibits no tenderness.  Abdominal: Soft. Bowel sounds are normal. She exhibits no distension and no mass. There is no tenderness.  Musculoskeletal:  Left wrist brace in place  Neurological: She is alert and oriented to person, place, and time.     Assessment & Plan:   1. Type 2 diabetes mellitus without complication, without long-term current use of insulin (HCC) Uncontrolled with A1c of greater than 14. CBG of 316-NovoLog 6 units administered Increase metformin to 2 tablets twice daily. If blood sugars are still elevated at next visit I will add glipizide regimen. Clinical pharmacist called in to see the patient to discuss diabetic diet, use of glucometer and checking blood sugars. Keep blood sugar logs with fasting goals of 80-120 mg/dl, random of less than 180 and in the event of sugars less than 60 mg/dl or greater than 400 mg/dl please notify the clinic ASAP. It is recommended that you undergo annual eye exams and annual foot exams. Pneumovax is recommended every 5 years before the age of 32 and once for a lifetime at or after the age of 13. - Glucose (CBG) - insulin aspart (novoLOG) injection 6 Units; Inject 0.06 mLs (6 Units total) into the skin once. - metFORMIN (GLUCOPHAGE XR) 500 MG 24 hr tablet; Take 2 tablets (1,000 mg total) by mouth 2 (two) times daily with a meal.  Dispense: 120 tablet; Refill: 2 - Glucose (CBG)  2. Closed fracture of left distal radius, initial encounter Keep appointment with orthopedics   At her next visit social work will be called in to discuss possible options for accommodation with supervision under structured setting.   Meds ordered this  encounter  Medications  . insulin aspart (novoLOG) injection 6 Units  . metFORMIN (GLUCOPHAGE XR) 500 MG 24 hr tablet    Sig: Take 2 tablets (1,000 mg total) by mouth 2 (two) times daily with a meal.    Dispense:  120 tablet    Refill:  2    Discontinue previous dose    Follow-up: Return in about 2 weeks (around 03/17/2016) for Follow-up on diabetes mellitus.   Greater than 50% of the 30 minutes spent in this encounter was spent discussing the diagnosis of Diabetes educating on diet, monitoring medications administered in clinic.  Arnoldo Morale MD

## 2016-03-03 NOTE — Patient Instructions (Signed)
Diabetes Mellitus and Food It is important for you to manage your blood sugar (glucose) level. Your blood glucose level can be greatly affected by what you eat. Eating healthier foods in the appropriate amounts throughout the day at about the same time each day will help you control your blood glucose level. It can also help slow or prevent worsening of your diabetes mellitus. Healthy eating may even help you improve the level of your blood pressure and reach or maintain a healthy weight.  General recommendations for healthful eating and cooking habits include:  Eating meals and snacks regularly. Avoid going long periods of time without eating to lose weight.  Eating a diet that consists mainly of plant-based foods, such as fruits, vegetables, nuts, legumes, and whole grains.  Using low-heat cooking methods, such as baking, instead of high-heat cooking methods, such as deep frying. Work with your dietitian to make sure you understand how to use the Nutrition Facts information on food labels. HOW CAN FOOD AFFECT ME? Carbohydrates Carbohydrates affect your blood glucose level more than any other type of food. Your dietitian will help you determine how many carbohydrates to eat at each meal and teach you how to count carbohydrates. Counting carbohydrates is important to keep your blood glucose at a healthy level, especially if you are using insulin or taking certain medicines for diabetes mellitus. Alcohol Alcohol can cause sudden decreases in blood glucose (hypoglycemia), especially if you use insulin or take certain medicines for diabetes mellitus. Hypoglycemia can be a life-threatening condition. Symptoms of hypoglycemia (sleepiness, dizziness, and disorientation) are similar to symptoms of having too much alcohol.  If your health care provider has given you approval to drink alcohol, do so in moderation and use the following guidelines:  Women should not have more than one drink per day, and men  should not have more than two drinks per day. One drink is equal to:  12 oz of beer.  5 oz of wine.  1 oz of hard liquor.  Do not drink on an empty stomach.  Keep yourself hydrated. Have water, diet soda, or unsweetened iced tea.  Regular soda, juice, and other mixers might contain a lot of carbohydrates and should be counted. WHAT FOODS ARE NOT RECOMMENDED? As you make food choices, it is important to remember that all foods are not the same. Some foods have fewer nutrients per serving than other foods, even though they might have the same number of calories or carbohydrates. It is difficult to get your body what it needs when you eat foods with fewer nutrients. Examples of foods that you should avoid that are high in calories and carbohydrates but low in nutrients include:  Trans fats (most processed foods list trans fats on the Nutrition Facts label).  Regular soda.  Juice.  Candy.  Sweets, such as cake, pie, doughnuts, and cookies.  Fried foods. WHAT FOODS CAN I EAT? Eat nutrient-rich foods, which will nourish your body and keep you healthy. The food you should eat also will depend on several factors, including:  The calories you need.  The medicines you take.  Your weight.  Your blood glucose level.  Your blood pressure level.  Your cholesterol level. You should eat a variety of foods, including:  Protein.  Lean cuts of meat.  Proteins low in saturated fats, such as fish, egg whites, and beans. Avoid processed meats.  Fruits and vegetables.  Fruits and vegetables that may help control blood glucose levels, such as apples, mangoes, and   yams.  Dairy products.  Choose fat-free or low-fat dairy products, such as milk, yogurt, and cheese.  Grains, bread, pasta, and rice.  Choose whole grain products, such as multigrain bread, whole oats, and brown rice. These foods may help control blood pressure.  Fats.  Foods containing healthful fats, such as nuts,  avocado, olive oil, canola oil, and fish. DOES EVERYONE WITH DIABETES MELLITUS HAVE THE SAME MEAL PLAN? Because every person with diabetes mellitus is different, there is not one meal plan that works for everyone. It is very important that you meet with a dietitian who will help you create a meal plan that is just right for you.   This information is not intended to replace advice given to you by your health care provider. Make sure you discuss any questions you have with your health care provider.   Document Released: 03/06/2005 Document Revised: 06/30/2014 Document Reviewed: 05/06/2013 Elsevier Interactive Patient Education 2016 Elsevier Inc.  

## 2016-03-04 MED FILL — METFORMIN HCL ER 500 MG TAB: 500 | 30 days supply | Qty: 120 | Fill #0

## 2016-03-17 ENCOUNTER — Ambulatory Visit: Payer: Self-pay | Attending: Family Medicine | Admitting: Family Medicine

## 2016-03-17 ENCOUNTER — Encounter: Payer: Self-pay | Admitting: Family Medicine

## 2016-03-17 ENCOUNTER — Encounter: Payer: Self-pay | Admitting: Licensed Clinical Social Worker

## 2016-03-17 VITALS — BP 156/95 | HR 113 | Temp 99.2°F | Ht <= 58 in | Wt 79.0 lb

## 2016-03-17 DIAGNOSIS — E119 Type 2 diabetes mellitus without complications: Secondary | ICD-10-CM | POA: Insufficient documentation

## 2016-03-17 DIAGNOSIS — R000 Tachycardia, unspecified: Secondary | ICD-10-CM | POA: Insufficient documentation

## 2016-03-17 DIAGNOSIS — Z609 Problem related to social environment, unspecified: Secondary | ICD-10-CM

## 2016-03-17 DIAGNOSIS — Z79899 Other long term (current) drug therapy: Secondary | ICD-10-CM | POA: Insufficient documentation

## 2016-03-17 DIAGNOSIS — Z7984 Long term (current) use of oral hypoglycemic drugs: Secondary | ICD-10-CM | POA: Insufficient documentation

## 2016-03-17 DIAGNOSIS — I10 Essential (primary) hypertension: Secondary | ICD-10-CM | POA: Insufficient documentation

## 2016-03-17 DIAGNOSIS — Z888 Allergy status to other drugs, medicaments and biological substances status: Secondary | ICD-10-CM | POA: Insufficient documentation

## 2016-03-17 DIAGNOSIS — Z659 Problem related to unspecified psychosocial circumstances: Secondary | ICD-10-CM

## 2016-03-17 DIAGNOSIS — Z8673 Personal history of transient ischemic attack (TIA), and cerebral infarction without residual deficits: Secondary | ICD-10-CM | POA: Insufficient documentation

## 2016-03-17 DIAGNOSIS — Z794 Long term (current) use of insulin: Secondary | ICD-10-CM

## 2016-03-17 DIAGNOSIS — E785 Hyperlipidemia, unspecified: Secondary | ICD-10-CM | POA: Insufficient documentation

## 2016-03-17 DIAGNOSIS — Z9889 Other specified postprocedural states: Secondary | ICD-10-CM | POA: Insufficient documentation

## 2016-03-17 DIAGNOSIS — Z7982 Long term (current) use of aspirin: Secondary | ICD-10-CM | POA: Insufficient documentation

## 2016-03-17 LAB — GLUCOSE, POCT (MANUAL RESULT ENTRY): POC Glucose: 226 mg/dl — AB (ref 70–99)

## 2016-03-17 MED ORDER — METOPROLOL TARTRATE 25 MG PO TABS: 25.0000 mg | ORAL_TABLET | Freq: Two times a day (BID) | ORAL | 3 refills | Status: DC

## 2016-03-17 MED ORDER — GLIPIZIDE 5 MG PO TABS: 2.5000 mg | ORAL_TABLET | Freq: Two times a day (BID) | ORAL | 3 refills | Status: DC

## 2016-03-17 MED ORDER — ATORVASTATIN CALCIUM 20 MG PO TABS: 20.0000 mg | ORAL_TABLET | Freq: Every day | ORAL | 3 refills | Status: DC

## 2016-03-17 MED FILL — METOPROLOL TARTRATE 25 MG T: 25 | 30 days supply | Qty: 60 | Fill #0

## 2016-03-17 MED FILL — glipiZIDE 5 MG TABS: 5 | 30 days supply | Qty: 30 | Fill #0

## 2016-03-17 MED FILL — ATORVASTATIN 20 MG TABLET: 20 | 30 days supply | Qty: 30 | Fill #0

## 2016-03-17 NOTE — Progress Notes (Signed)
Sister is present and would like to speak with a Education officer, museum in regards to patient getting outside care

## 2016-03-17 NOTE — Progress Notes (Signed)
Subjective:  Patient ID: [name] Michael, female    DOB: 1960/08/31  Age: 55 y.o. MRN: [id number]  CC: Hypertension; Diabetes; and strokes (2 TIA's)   HPI [name] Michael is a 55 year old female with a history of type 2 diabetes mellitus (A1c 14), left radial fracture (currently wearing a left wrist brace) who is accompanied by her sister (whom she lives with) for an office visit today. 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  Blood sugar log reviewed today reveals a 7 day average of 236, 30 day average of 245 despite compliance with metformin. She was seen by the clinical pharmacist and received diabetic education at her last office visit. Blood pressure is elevated today and she is not on any antihypertensive.   Past Surgical History:  Procedure Laterality Date  . OPEN REDUCTION INTERNAL FIXATION (ORIF) DISTAL RADIAL FRACTURE Left 01/28/2016   Procedure: OPEN REDUCTION INTERNAL FIXATION (ORIF) DISTAL RADIAL FRACTURE;  Surgeon: Iran Planas, MD;  Location: [place];  Service: Orthopedics;  Laterality: Left;  . [place]     age 47     Past Medical History:  Diagnosis Date  . Diabetes mellitus   . Hyperlipidemia   . Hypertension   . Stroke [facility]) 04-01-11   left frontal subcortical, saw Dr. [name]   . TIA (transient ischemic attack) 03-12-11    Allergies  Allergen Reactions  . Hydrocodone Nausea And Vomiting     Outpatient Medications Prior to Visit  Medication Sig Dispense Refill  . aspirin 81 MG tablet Take 1 tablet (81 mg total) by mouth daily. 30 tablet 12  . Blood Glucose Monitoring Suppl (TRUE METRIX METER) w/Device KIT Use as directed 1 kit 0  . glucose blood (TRUE METRIX BLOOD GLUCOSE  TEST) test strip Use as instructed 100 each 12  . metFORMIN (GLUCOPHAGE XR) 500 MG 24 hr tablet Take 2 tablets (1,000 mg total) by mouth 2 (two) times daily with a meal. 120 tablet 2  . TRUEPLUS LANCETS 28G MISC Use as directed 100 each 5  . Insulin Pen Needle 31G X 5 MM MISC Use as directed (Patient not taking: Reported on 03/17/2016) 100 each 2  . Multiple Vitamin (MULTIVITAMIN) tablet Take 1 tablet by mouth daily.       No facility-administered medications prior to visit.     ROS Review of Systems Constitutional: Negative for activity change, appetite change and fatigue.  HENT: Negative for congestion, sinus pressure and sore throat.   Eyes: Negative for visual disturbance.  Respiratory: Negative for cough, chest tightness, shortness of breath and wheezing.   Cardiovascular: Negative for chest pain and palpitations.  Gastrointestinal: Negative for abdominal distention, abdominal pain and constipation.  Endocrine: Negative for polydipsia.  Genitourinary: Negative for dysuria and frequency.  Musculoskeletal:       See hpi  Skin: Negative for rash.  Neurological: Negative for tremors, light-headedness and numbness.  Hematological: Does not bruise/bleed easily.  Psychiatric/Behavioral: Negative for agitation and behavioral problems.   Objective:  BP (!) 156/95 (BP Location: Right Arm, Patient Position: Sitting, Cuff Size: Small)   Pulse (!) 113   Temp 99.2 F (37.3 C) (Oral)   Ht 4' 6.5" (1.384 m)  Wt 79 lb (35.8 kg)   SpO2 96%   BMI 18.70 kg/m   BP/Weight 03/17/2016 03/03/2016 6/73/4193  Systolic BP 790 240 973  Diastolic BP 95 90 80  Wt. (Lbs) 79 80 79.4  BMI 18.7 18.94 18.79      Physical Exam Constitutional: She is oriented to person, place, and time. She appears well-developed and well-nourished.  Cardiovascular: Normal rate, normal heart sounds and intact distal pulses.   No murmur heard. Pulmonary/Chest: Effort normal and breath sounds normal. She has no wheezes.  She has no rales. She exhibits no tenderness.  Abdominal: Soft. Bowel sounds are normal. She exhibits no distension and no mass. There is no tenderness.  Musculoskeletal:  Left wrist brace in place  Neurological: She is alert and oriented to person, place, and time.   Assessment & Plan:   1. Type 2 diabetes mellitus without complication, with long-term current use of insulin (HCC) Uncontrolled with A1c of greater than 14.0 Blood sugar log reveals improvement We'll add Glipizide to regimen; continue metformin ADA diet - Glucose (CBG) - glipiZIDE (GLUCOTROL) 5 MG tablet; Take 0.5 tablets (2.5 mg total) by mouth 2 (two) times daily before a meal.  Dispense: 30 tablet; Refill: 3  2. Hyperlipidemia Last lipid panel was uncontrolled Low cholesterol diet - atorvastatin (LIPITOR) 20 MG tablet; Take 1 tablet (20 mg total) by mouth daily.  Dispense: 30 tablet; Refill: 3  3. Essential hypertension Uncontrolled Will place on metoprolol and hope this will help with tachycardia - metoprolol tartrate (LOPRESSOR) 25 MG tablet; Take 1 tablet (25 mg total) by mouth 2 (two) times daily.  Dispense: 60 tablet; Refill: 3  4. Poor social situation LCSW called in to see the patient to identify community resources for assisted living   Meds ordered this encounter  Medications  . atorvastatin (LIPITOR) 20 MG tablet    Sig: Take 1 tablet (20 mg total) by mouth daily.    Dispense:  30 tablet    Refill:  3  . metoprolol tartrate (LOPRESSOR) 25 MG tablet    Sig: Take 1 tablet (25 mg total) by mouth 2 (two) times daily.    Dispense:  60 tablet    Refill:  3  . glipiZIDE (GLUCOTROL) 5 MG tablet    Sig: Take 0.5 tablets (2.5 mg total) by mouth 2 (two) times daily before a meal.    Dispense:  30 tablet    Refill:  3    Follow-up: Return in about 1 month (around 04/16/2016) for Follow-up on diabetes mellitus.   Arnoldo Morale MD

## 2016-03-17 NOTE — Patient Instructions (Signed)
Diabetes Mellitus and Food It is important for you to manage your blood sugar (glucose) level. Your blood glucose level can be greatly affected by what you eat. Eating healthier foods in the appropriate amounts throughout the day at about the same time each day will help you control your blood glucose level. It can also help slow or prevent worsening of your diabetes mellitus. Healthy eating may even help you improve the level of your blood pressure and reach or maintain a healthy weight.  General recommendations for healthful eating and cooking habits include:  Eating meals and snacks regularly. Avoid going long periods of time without eating to lose weight.  Eating a diet that consists mainly of plant-based foods, such as fruits, vegetables, nuts, legumes, and whole grains.  Using low-heat cooking methods, such as baking, instead of high-heat cooking methods, such as deep frying. Work with your dietitian to make sure you understand how to use the Nutrition Facts information on food labels. HOW CAN FOOD AFFECT ME? Carbohydrates Carbohydrates affect your blood glucose level more than any other type of food. Your dietitian will help you determine how many carbohydrates to eat at each meal and teach you how to count carbohydrates. Counting carbohydrates is important to keep your blood glucose at a healthy level, especially if you are using insulin or taking certain medicines for diabetes mellitus. Alcohol Alcohol can cause sudden decreases in blood glucose (hypoglycemia), especially if you use insulin or take certain medicines for diabetes mellitus. Hypoglycemia can be a life-threatening condition. Symptoms of hypoglycemia (sleepiness, dizziness, and disorientation) are similar to symptoms of having too much alcohol.  If your health care provider has given you approval to drink alcohol, do so in moderation and use the following guidelines:  Women should not have more than one drink per day, and men  should not have more than two drinks per day. One drink is equal to:  12 oz of beer.  5 oz of wine.  1 oz of hard liquor.  Do not drink on an empty stomach.  Keep yourself hydrated. Have water, diet soda, or unsweetened iced tea.  Regular soda, juice, and other mixers might contain a lot of carbohydrates and should be counted. WHAT FOODS ARE NOT RECOMMENDED? As you make food choices, it is important to remember that all foods are not the same. Some foods have fewer nutrients per serving than other foods, even though they might have the same number of calories or carbohydrates. It is difficult to get your body what it needs when you eat foods with fewer nutrients. Examples of foods that you should avoid that are high in calories and carbohydrates but low in nutrients include:  Trans fats (most processed foods list trans fats on the Nutrition Facts label).  Regular soda.  Juice.  Candy.  Sweets, such as cake, pie, doughnuts, and cookies.  Fried foods. WHAT FOODS CAN I EAT? Eat nutrient-rich foods, which will nourish your body and keep you healthy. The food you should eat also will depend on several factors, including:  The calories you need.  The medicines you take.  Your weight.  Your blood glucose level.  Your blood pressure level.  Your cholesterol level. You should eat a variety of foods, including:  Protein.  Lean cuts of meat.  Proteins low in saturated fats, such as fish, egg whites, and beans. Avoid processed meats.  Fruits and vegetables.  Fruits and vegetables that may help control blood glucose levels, such as apples, mangoes, and   yams.  Dairy products.  Choose fat-free or low-fat dairy products, such as milk, yogurt, and cheese.  Grains, bread, pasta, and rice.  Choose whole grain products, such as multigrain bread, whole oats, and brown rice. These foods may help control blood pressure.  Fats.  Foods containing healthful fats, such as nuts,  avocado, olive oil, canola oil, and fish. DOES EVERYONE WITH DIABETES MELLITUS HAVE THE SAME MEAL PLAN? Because every person with diabetes mellitus is different, there is not one meal plan that works for everyone. It is very important that you meet with a dietitian who will help you create a meal plan that is just right for you.   This information is not intended to replace advice given to you by your health care provider. Make sure you discuss any questions you have with your health care provider.   Document Released: 03/06/2005 Document Revised: 06/30/2014 Document Reviewed: 05/06/2013 Elsevier Interactive Patient Education 2016 Elsevier Inc.  

## 2016-03-28 MED FILL — TRUE METRIX TEST STRIP: 25 days supply | Qty: 100 | Fill #1

## 2016-04-04 NOTE — Progress Notes (Signed)
Session Start time: 12:10 pm   End Time: 12:30 pm Total Time:  20 minutes Type of Service: [place] Interpreter: No.   Interpreter Name & Language: N/A # [facility] Visits July 2017-June 2018: 1   SUBJECTIVE: [name] Michael is a 55 y.o. female  Pt. was referred by Dr. Jarold Song for:  [employer]. Pt. reports the following symptoms/concerns: Pt reports hx with depression; however, did not report any current symptoms. Has mild anxiety about returning to work Duration of problem:  Pt was unable to provide information Severity: mild Previous treatment: None reported   OBJECTIVE: Mood: Appropriate & Affect: Appropriate Risk of harm to self or others: Not indicated  Assessments administered: PHQ-9; GAD-7  LIFE CONTEXT:  Family & Social: Pt resides with brother and sister in [place].  School/ Work: Pt is employed at [employer]. She is currently receiving Worker's Comp due to a fall that occurred at the workplace Self-Care: None reported Life changes: Pt was injured from a fall that occurred at workplace. Pt resides with relatives; however, is open to information regarding assisted living What is important to pt/family (values): Family   GOALS ADDRESSED:  Obtaining community resources regarding assisted living  INTERVENTIONS: Motivational Interviewing   ASSESSMENT:  Pt currently experiencing mild anxiety about returning to work after being injured at the workplace. Pt has hx of depression; however, denies current symptoms.  Pt may benefit from community resources to assist with placement. Pt's sister requested information on how to have pt tested for disability. LCSWA provided pt resources on agencies that test for learning disabilities, in addition to social services (i.e. Applying for Medicaid, Disability, etc) LCSWA provided additional information on assisted living facilities.       PLAN: 1. F/U with behavioral health clinician: LCSWA will follow up with pt  at next scheduled medical visit. 2. Behavioral Health meds: None reported  3. Behavioral recommendations: Pt is encouraged to schedule follow up appointment with LCSWA to address anxiety 4. Referral: Brief Counseling/Psychotherapy and [employer] 5. From scale of 1-10, how likely are you to follow plan: 7/10   [name] Chesterfield, MSW, [place]  04/04/16 4:50 PM  Warmhandoff:   Warm Hand Off Completed.

## 2016-04-07 MED FILL — METFORMIN HCL ER 500 MG TAB: 500 | 30 days supply | Qty: 120 | Fill #1

## 2016-04-15 ENCOUNTER — Emergency Department (HOSPITAL_COMMUNITY)
Admission: EM | Admit: 2016-04-15 | Discharge: 2016-04-15 | Disposition: A | Discharge status: Home / Self Care | Payer: Self-pay | Attending: Emergency Medicine | Admitting: Emergency Medicine

## 2016-04-15 ENCOUNTER — Encounter (HOSPITAL_COMMUNITY): Payer: Self-pay | Admitting: Emergency Medicine

## 2016-04-15 ENCOUNTER — Encounter (HOSPITAL_COMMUNITY): Payer: Self-pay | Admitting: Family Medicine

## 2016-04-15 ENCOUNTER — Ambulatory Visit (HOSPITAL_COMMUNITY)
Admission: EM | Admit: 2016-04-15 | Discharge: 2016-04-15 | Disposition: A | Discharge status: Nursing Home (Includes ICF) | Payer: Self-pay | Attending: Family Medicine | Admitting: Family Medicine

## 2016-04-15 DIAGNOSIS — H3321 Serous retinal detachment, right eye: Secondary | ICD-10-CM | POA: Insufficient documentation

## 2016-04-15 DIAGNOSIS — I10 Essential (primary) hypertension: Secondary | ICD-10-CM | POA: Insufficient documentation

## 2016-04-15 DIAGNOSIS — H53131 Sudden visual loss, right eye: Secondary | ICD-10-CM

## 2016-04-15 DIAGNOSIS — E119 Type 2 diabetes mellitus without complications: Secondary | ICD-10-CM | POA: Insufficient documentation

## 2016-04-15 DIAGNOSIS — Z7982 Long term (current) use of aspirin: Secondary | ICD-10-CM | POA: Insufficient documentation

## 2016-04-15 DIAGNOSIS — Z7984 Long term (current) use of oral hypoglycemic drugs: Secondary | ICD-10-CM | POA: Insufficient documentation

## 2016-04-15 DIAGNOSIS — Z79899 Other long term (current) drug therapy: Secondary | ICD-10-CM | POA: Insufficient documentation

## 2016-04-15 DIAGNOSIS — Z8673 Personal history of transient ischemic attack (TIA), and cerebral infarction without residual deficits: Secondary | ICD-10-CM | POA: Insufficient documentation

## 2016-04-15 DIAGNOSIS — F17210 Nicotine dependence, cigarettes, uncomplicated: Secondary | ICD-10-CM | POA: Insufficient documentation

## 2016-04-15 LAB — CBG MONITORING, ED: GLUCOSE-CAPILLARY: 277 mg/dL — AB (ref 65–99)

## 2016-04-15 NOTE — ED Notes (Signed)
EDP at bedside  

## 2016-04-15 NOTE — ED Triage Notes (Signed)
Pt states at 3 this am, her vision is black in right eye when looking straight. Pt states in 2012 the same thing happened in her left eye. Pt states this was caused by diabetes. Pt denies any numbness or tingling, pain.

## 2016-04-15 NOTE — Discharge Instructions (Signed)
Go to the eye doctors office - they will see you at 2:30.  Please obtain all of your results from medical records or have your doctors office obtain the results - share them with your doctor - you should be seen at your doctors office in the next 2 days. Call today to arrange your follow up. Take the medications as prescribed. Please review all of the medicines and only take them if you do not have an allergy to them. Please be aware that if you are taking birth control pills, taking other prescriptions, ESPECIALLY ANTIBIOTICS may make the birth control ineffective - if this is the case, either do not engage in sexual activity or use alternative methods of birth control such as condoms until you have finished the medicine and your family doctor says it is OK to restart them. If you are on a blood thinner such as COUMADIN, be aware that any other medicine that you take may cause the coumadin to either work too much, or not enough - you should have your coumadin level rechecked in next 7 days if this is the case.  ?  It is also a possibility that you have an allergic reaction to any of the medicines that you have been prescribed - Everybody reacts differently to medications and while MOST people have no trouble with most medicines, you may have a reaction such as nausea, vomiting, rash, swelling, shortness of breath. If this is the case, please stop taking the medicine immediately and contact your physician.  ?  You should return to the ER if you develop severe or worsening symptoms.

## 2016-04-15 NOTE — ED Triage Notes (Signed)
Pt here for loss of vision in right eye. sts her vision is black when looking straight forward but she can see peripherally. sts she woke up this way. sts her blood sugars have been running high.

## 2016-04-15 NOTE — ED Provider Notes (Signed)
[facility] DEPT Provider Note   CSN: [id number] Arrival date & time: 04/15/16  1056     History   Chief Complaint Chief Complaint  Patient presents with  . Visual Field Change    HPI [name] Michael is a 55 y.o. female.  The pt was sent from urgent care for possibly  Having a stroke. She awoke at 3:00 AM and noticed that her right eye had decreased vision. She feels as though she has an area of black out in the center with a small amount of peripheral vision on either side. She already has poor vision on the left at baseline. She states that for years she has had significant decreased and almost no visual capacity of the left eye. She does have some light sensation. She denies any other neurologic complaints including difficulty speaking, coordination, strength or sensation. She has no pain in her right eye, she has never had difficulty with her right eye in the past. 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 Urgent care evaluated the patient and sent her to the emergency department for evaluation   The history is provided by the patient, a relative and medical records.    Past Medical History:  Diagnosis Date  . Diabetes mellitus   . Hyperlipidemia   . Hypertension   . Stroke [facility]) 04-01-11   left frontal subcortical, saw Dr. [name]   . TIA (transient ischemic attack) 03-12-11    Patient Active Problem List   Diagnosis Date Noted  . Closed fracture of left distal radius 01/28/2016  . Poor social situation 03/09/2013  . Depression 03/01/2013  . Abnormal mammogram 12/20/2012  . Decreased vision 11/17/2012  . Diabetes ([place]) 02/09/2007  . Hyperlipidemia 02/09/2007  . Essential hypertension 02/09/2007  . GERD 02/09/2007    Past Surgical History:  Procedure Laterality Date  . OPEN REDUCTION INTERNAL FIXATION (ORIF) DISTAL RADIAL FRACTURE Left 01/28/2016   Procedure: OPEN REDUCTION INTERNAL  FIXATION (ORIF) DISTAL RADIAL FRACTURE;  Surgeon: Iran Planas, MD;  Location: [place];  Service: Orthopedics;  Laterality: Left;  . [place]     age 26    OB History    No data available       Home Medications    Prior to Admission medications   Medication Sig Start Date End Date Taking? Authorizing Provider  aspirin 81 MG tablet Take 1 tablet (81 mg total) by mouth daily. 08/24/12  Yes Reyne Dumas, MD  atorvastatin (LIPITOR) 20 MG tablet Take 1 tablet (20 mg total) by mouth daily. 03/17/16  Yes Arnoldo Morale, MD  Blood Glucose Monitoring Suppl (TRUE METRIX METER) w/Device KIT Use as directed 02/06/16  Yes Bruce Kendall Flack, MD  glipiZIDE (GLUCOTROL) 5 MG tablet Take 0.5 tablets (2.5 mg total) by mouth 2 (two) times daily before a meal. 03/17/16  Yes Arnoldo Morale, MD  glucose blood (TRUE METRIX BLOOD GLUCOSE TEST) test strip Use as instructed 02/06/16  Yes Bruce Kendall Flack, MD  ibuprofen (ADVIL,MOTRIN) 200 MG tablet Take 200 mg by mouth every 6 (six) hours as needed for moderate pain.   Yes Historical Provider, MD  Insulin Pen Needle 31G X 5 MM MISC Use as directed 02/06/16  Yes Bruce Kendall Flack, MD  metFORMIN (GLUCOPHAGE XR) 500 MG 24 hr tablet Take 2 tablets (1,000 mg total) by mouth 2 (two)  times daily with a meal. 03/03/16  Yes Arnoldo Morale, MD  metoprolol tartrate (LOPRESSOR) 25 MG tablet Take 1 tablet (25 mg total) by mouth 2 (two) times daily. 03/17/16  Yes Arnoldo Morale, MD  Multiple Vitamin (MULTIVITAMIN) tablet Take 1 tablet by mouth daily.     Yes Historical Provider, MD  TRUEPLUS LANCETS 28G MISC Use as directed 02/06/16  Yes Lisabeth Pick, MD    Family History Family History  Problem Relation Age of Onset  . Stroke Mother   . Diabetes Mother   . Kidney failure Mother   . Heart failure Mother   . Stroke Father   . Cancer Sister     Breast- 1's    Social History Social History  Substance Use Topics  . Smoking  status: Current Every Day Smoker    Packs/day: 1.00    Years: 32.00    Types: Cigarettes    Last attempt to quit: 09/23/2012  . Smokeless tobacco: Never Used     Comment: or less  . Alcohol use No     Allergies   Hydrocodone   Review of Systems Review of Systems  All other systems reviewed and are negative.    Physical Exam Updated Vital Signs BP 173/86 (BP Location: Right Arm)   Pulse 103   Temp 98 F (36.7 C) (Oral)   Resp 14   Ht '4\' 6"'$  (1.372 m)   Wt 80 lb (36.3 kg)   LMP 03/06/2013   SpO2 98%   BMI 19.29 kg/m   Physical Exam  Constitutional: She appears well-developed and well-nourished. No distress.  HENT:  Head: Normocephalic and atraumatic.  Mouth/Throat: Oropharynx is clear and moist. No oropharyngeal exudate.  Eyes: Conjunctivae and EOM are normal. Pupils are equal, round, and reactive to light. Right eye exhibits no discharge. Left eye exhibits no discharge. No scleral icterus.  Afferent pupillary defect on the L.  She has normal pupil on the R.  posterior exam is significant for what appears to be a line of demarcation in the temporal retina with abnormal appearance. Bedside ultrasound also reveals abnormal appearance to the retina with a possible retinal detachment or posterior vitreous hemorrhage  Neck: Normal range of motion. Neck supple. No JVD present. No thyromegaly present.  Cardiovascular: Normal rate, regular rhythm, normal heart sounds and intact distal pulses.  Exam reveals no gallop and no friction rub.   No murmur heard. Pulmonary/Chest: Effort normal and breath sounds normal. No respiratory distress. She has no wheezes. She has no rales.  Abdominal: Soft. Bowel sounds are normal. She exhibits no distension and no mass. There is no tenderness.  Musculoskeletal: Normal range of motion. She exhibits no edema or tenderness.  Lymphadenopathy:    She has no cervical adenopathy.  Neurological: She is alert. Coordination normal.  ,Neurologic  exam:  Speech clear,  extraocular movements intact  Normal peripheral visual fields Cranial nerves III through XII normal including no facial droop Follows commands, moves all extremities x4, normal strength to bilateral upper and lower extremities at all major muscle groups including grip Sensation normal to light touch and pinprick Coordination intact, no limb ataxia, finger-nose-finger normal Rapid alternating movements normal No pronator drift Gait normal   Skin: Skin is warm and dry. No rash noted. No erythema.  Psychiatric: She has a normal mood and affect. Her behavior is normal.  Nursing note and vitals reviewed.    ED Treatments / Results  Labs (all labs ordered are listed, but only abnormal results  are displayed) Labs Reviewed  CBG MONITORING, ED - Abnormal; Notable for the following:       Result Value   Glucose-Capillary 277 (*)    All other components within normal limits    EKG  EKG Interpretation None       Radiology No results found.  Procedures Procedures (including critical care time)  Medications Ordered in ED Medications - No data to display   Initial Impression / Assessment and Plan / ED Course  I have reviewed the triage vital signs and the nursing notes.  Pertinent labs & imaging results that were available during my care of the patient were reviewed by me and considered in my medical decision making (see chart for details).  Clinical Course   Likely retinal problem - [name] confirms abnormal retinal appearance. D/w Dr. Posey Pronto - retinal specialist at 11:35, will share pictures and obtain guidance, No other neuro defecits to suggest stroke at this time. D/w Dr. Posey Pronto - will see at 2:30 this afternoon - appreciate his expertise and willingness to see Mr. codd Family informed - agreeable.  Final Clinical Impressions(s) / ED Diagnoses   Final diagnoses:  Right retinal detachment    New Prescriptions New Prescriptions   No medications on  file     Noemi Chapel, MD 04/15/16 1213

## 2016-04-15 NOTE — ED Provider Notes (Signed)
[facility]    CSN: [id number] Arrival date & time: 04/15/16  1002     History   Chief Complaint Chief Complaint  Patient presents with  . Eye Problem    HPI [name] Michael is a 55 y.o. female.   The history is provided by the patient.  Eye Problem  Location:  Right eye Quality:  Dull Severity:  Moderate Onset quality:  Sudden Progression:  Worsening Chronicity:  New Context comment:  Visual loss right eye on awakening today, h/o tia, smokes 1ppd, no ha no pain no change in ext fxn. Associated symptoms: no redness     Past Medical History:  Diagnosis Date  . Diabetes mellitus   . Hyperlipidemia   . Hypertension   . Stroke [name] Michael) 04-01-11   left frontal subcortical, saw Dr. [name]   . TIA (transient ischemic attack) 03-12-11    Patient Active Problem List   Diagnosis Date Noted  . Closed fracture of left distal radius 01/28/2016  . Poor social situation 03/09/2013  . Depression 03/01/2013  . Abnormal mammogram 12/20/2012  . Decreased vision 11/17/2012  . Diabetes ([place]) 02/09/2007  . Hyperlipidemia 02/09/2007  . Essential hypertension 02/09/2007  . GERD 02/09/2007    Past Surgical History:  Procedure Laterality Date  . OPEN REDUCTION INTERNAL FIXATION (ORIF) DISTAL RADIAL FRACTURE Left 01/28/2016   Procedure: OPEN REDUCTION INTERNAL FIXATION (ORIF) DISTAL RADIAL FRACTURE;  Surgeon: Iran Planas, MD;  Location: [place];  Service: Orthopedics;  Laterality: Left;  . [name] Michael     age 50    OB History    No data available       Home Medications    Prior to Admission medications   Medication Sig Start Date End Date Taking? Authorizing Provider  aspirin 81 MG tablet Take 1 tablet (81 mg total) by mouth daily. 08/24/12   Reyne Dumas, MD  atorvastatin (LIPITOR) 20 MG tablet Take 1 tablet (20 mg total) by mouth daily. 03/17/16   Arnoldo Morale, MD  Blood Glucose Monitoring Suppl (TRUE  METRIX METER) w/Device KIT Use as directed 02/06/16   Lisabeth Pick, MD  glipiZIDE (GLUCOTROL) 5 MG tablet Take 0.5 tablets (2.5 mg total) by mouth 2 (two) times daily before a meal. 03/17/16   Arnoldo Morale, MD  glucose blood (TRUE METRIX BLOOD GLUCOSE TEST) test strip Use as instructed 02/06/16   Lisabeth Pick, MD  Insulin Pen Needle 31G X 5 MM MISC Use as directed Patient not taking: Reported on 03/17/2016 02/06/16   Lisabeth Pick, MD  metFORMIN (GLUCOPHAGE XR) 500 MG 24 hr tablet Take 2 tablets (1,000 mg total) by mouth 2 (two) times daily with a meal. 03/03/16   Arnoldo Morale, MD  metoprolol tartrate (LOPRESSOR) 25 MG tablet Take 1 tablet (25 mg total) by mouth 2 (two) times daily. 03/17/16   Arnoldo Morale, MD  Multiple Vitamin (MULTIVITAMIN) tablet Take 1 tablet by mouth daily.      Historical Provider, MD  TRUEPLUS LANCETS 28G MISC Use as directed 02/06/16   Lisabeth Pick, MD    Family History Family History  Problem Relation Age of Onset  . Stroke Mother   . Diabetes Mother   . Kidney failure Mother   . Heart failure Mother   . Stroke Father   . Cancer Sister     Breast- 30's    Social History Social History  Substance Use Topics  .  Smoking status: Former Smoker    Packs/day: 1.00    Years: 32.00    Types: Cigarettes    Quit date: 09/23/2012  . Smokeless tobacco: Never Used     Comment: or less  . Alcohol use No     Allergies   Hydrocodone   Review of Systems Review of Systems  Constitutional: Negative.   Eyes: Positive for visual disturbance. Negative for pain and redness.  All other systems reviewed and are negative.    Physical Exam Triage Vital Signs ED Triage Vitals [04/15/16 1023]  Enc Vitals Group     BP 170/100     Pulse Rate 105     Resp 18     Temp 98 F (36.7 C)     Temp Source Oral     SpO2 97 %     Weight      Height      Head Circumference      Peak Flow      Pain Score      Pain Loc      Pain Edu?      Excl. in [place]?    No data  found.   Updated Vital Signs BP 170/100   Pulse 105   Temp 98 F (36.7 C) (Oral)   Resp 18   SpO2 97%   Visual Acuity Right Eye Distance:   Left Eye Distance:   Bilateral Distance:    Right Eye Near:   Left Eye Near:    Bilateral Near:     Physical Exam  Constitutional: She appears well-developed and well-nourished.  Eyes: EOM are normal. Pupils are equal, round, and reactive to light.  Cardiovascular: Normal rate, regular rhythm, normal heart sounds and intact distal pulses.   Nursing note and vitals reviewed.    UC Treatments / Results  Labs (all labs ordered are listed, but only abnormal results are displayed) Labs Reviewed - No data to display  EKG  EKG Interpretation None       Radiology No results found.  Procedures Procedures (including critical care time)  Medications Ordered in UC Medications - No data to display   Initial Impression / Assessment and Plan / UC Course  I have reviewed the triage vital signs and the nursing notes.  Pertinent labs & imaging results that were available during my care of the patient were reviewed by me and considered in my medical decision making (see chart for details).  Clinical Course    Sent for visual tia sx. Right eye onset this am, smokes 1ppd.  Final Clinical Impressions(s) / UC Diagnoses   Final diagnoses:  None    New Prescriptions New Prescriptions   No medications on file     [name] Fischer, MD 04/15/16 1045

## 2016-04-15 NOTE — ED Notes (Signed)
Pt's CBG 277.  Informed Annabelle Harman, RN and Dr. Sabra Heck.

## 2016-04-18 ENCOUNTER — Ambulatory Visit: Payer: Self-pay | Admitting: Family Medicine

## 2016-04-18 MED FILL — ?ATORVASTATIN 20 MG TABLET: 20 | 30 days supply | Qty: 30 | Fill #1

## 2016-04-18 MED FILL — ?METOPROLOL 25 MG TABLET: 25 | 30 days supply | Qty: 60 | Fill #1

## 2016-04-18 MED FILL — glipiZIDE 5 MG TABS: 5 | 30 days supply | Qty: 30 | Fill #1

## 2016-04-28 ENCOUNTER — Ambulatory Visit: Payer: Self-pay | Attending: Family Medicine

## 2016-04-28 MED FILL — TRUEplus LANCETS 28G MISC: 25 days supply | Qty: 100 | Fill #1

## 2016-04-28 MED FILL — TRUE METRIX TEST STRIP: 25 days supply | Qty: 100 | Fill #2

## 2016-04-29 ENCOUNTER — Ambulatory Visit: Payer: Self-pay | Attending: Family Medicine | Admitting: Family Medicine

## 2016-04-29 ENCOUNTER — Encounter: Payer: Self-pay | Admitting: Family Medicine

## 2016-04-29 VITALS — BP 156/83 | HR 98 | Temp 98.4°F | Ht <= 58 in | Wt 80.6 lb

## 2016-04-29 DIAGNOSIS — H3322 Serous retinal detachment, left eye: Secondary | ICD-10-CM | POA: Insufficient documentation

## 2016-04-29 DIAGNOSIS — E0800 Diabetes mellitus due to underlying condition with hyperosmolarity without nonketotic hyperglycemic-hyperosmolar coma (NKHHC): Secondary | ICD-10-CM

## 2016-04-29 DIAGNOSIS — H3321 Serous retinal detachment, right eye: Secondary | ICD-10-CM | POA: Insufficient documentation

## 2016-04-29 DIAGNOSIS — I10 Essential (primary) hypertension: Secondary | ICD-10-CM | POA: Insufficient documentation

## 2016-04-29 DIAGNOSIS — E113599 Type 2 diabetes mellitus with proliferative diabetic retinopathy without macular edema, unspecified eye: Secondary | ICD-10-CM | POA: Insufficient documentation

## 2016-04-29 DIAGNOSIS — E113591 Type 2 diabetes mellitus with proliferative diabetic retinopathy without macular edema, right eye: Secondary | ICD-10-CM | POA: Insufficient documentation

## 2016-04-29 DIAGNOSIS — E7800 Pure hypercholesterolemia, unspecified: Secondary | ICD-10-CM

## 2016-04-29 DIAGNOSIS — Z794 Long term (current) use of insulin: Secondary | ICD-10-CM | POA: Insufficient documentation

## 2016-04-29 DIAGNOSIS — E785 Hyperlipidemia, unspecified: Secondary | ICD-10-CM | POA: Insufficient documentation

## 2016-04-29 LAB — GLUCOSE, POCT (MANUAL RESULT ENTRY): POC Glucose: 265 mg/dl — AB (ref 70–99)

## 2016-04-29 LAB — POCT GLYCOSYLATED HEMOGLOBIN (HGB A1C): Hemoglobin A1C: 10.9

## 2016-04-29 MED ORDER — METOPROLOL TARTRATE 25 MG PO TABS: 25.0000 mg | ORAL_TABLET | Freq: Two times a day (BID) | ORAL | 3 refills | Status: DC

## 2016-04-29 MED ORDER — ATORVASTATIN CALCIUM 20 MG PO TABS: 20.0000 mg | ORAL_TABLET | Freq: Every day | ORAL | 3 refills | Status: DC

## 2016-04-29 MED ORDER — GLIPIZIDE 5 MG PO TABS: 5.0000 mg | ORAL_TABLET | Freq: Two times a day (BID) | ORAL | 3 refills | Status: DC

## 2016-04-29 MED ORDER — LISINOPRIL 5 MG PO TABS: 5.0000 mg | ORAL_TABLET | Freq: Every day | ORAL | 3 refills | Status: DC

## 2016-04-29 MED ORDER — METFORMIN HCL ER 500 MG PO TB24: 1000.0000 mg | ORAL_TABLET | Freq: Two times a day (BID) | ORAL | 3 refills | Status: DC

## 2016-04-29 MED FILL — LISINOPRIL 5 MG TABLET: 5 | 30 days supply | Qty: 30 | Fill #0

## 2016-04-29 NOTE — Progress Notes (Signed)
Subjective:    Patient ID: [name] Michael, female    DOB: 10/28/1960, 55 y.o.   MRN: [id number]  HPI She is a 55 year old female with a history of type 2 diabetes mellitus (A1c 10.9), hyperlipidemia, left retinal detachment, hypertension who is accompanied by her sister (whom she lives with) for an office visit today.  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 She still reports vision in her right eye has not returned to her baseline.  She has an upcoming appointment at social services in 2 days to determine the status of her Medicaid. Her sister is concerned that the patient is unable to function on her own; she has had to stop work and does have some form of undiagnosed mental challenge which she will want evaluated. She would also like her placed in a group home.  Her random and fasting blood sugars have ranged in the 160-210 range and she denies episodes of hypoglycemia. She also denies numbness in extremities.  Past Medical History:  Diagnosis Date  . Diabetes mellitus   . Hyperlipidemia   . Hypertension   . Stroke [facility]) 04-01-11   left frontal subcortical, saw Dr. [name]   . TIA (transient ischemic attack) 03-12-11    Past Surgical History:  Procedure Laterality Date  . OPEN REDUCTION INTERNAL FIXATION (ORIF) DISTAL RADIAL FRACTURE Left 01/28/2016   Procedure: OPEN REDUCTION INTERNAL FIXATION (ORIF) DISTAL RADIAL FRACTURE;  Surgeon: Iran Planas, MD;  Location: [place];  Service: Orthopedics;  Laterality: Left;  . [employer] ADENOIDECTOMY     age 76    Allergies  Allergen Reactions  . Hydrocodone Nausea And Vomiting     Review of Systems  Constitutional: Negative for activity change,  appetite change and fatigue.  HENT: Negative for congestion, sinus pressure and sore throat.   Eyes: Positive for visual disturbance (blind in left eye; visually impaired in the right eye).  Respiratory: Negative for cough, chest tightness, shortness of breath and wheezing.   Cardiovascular: Negative for chest pain and palpitations.  Gastrointestinal: Negative for abdominal distention, abdominal pain and constipation.  Endocrine: Negative for polydipsia.  Genitourinary: Negative for dysuria and frequency.  Musculoskeletal: Negative for arthralgias and back pain.  Skin: Negative for rash.  Neurological: Negative for tremors, light-headedness and numbness.  Hematological: Does not bruise/bleed easily.  Psychiatric/Behavioral: Negative for agitation and behavioral problems.       Objective: Vitals:   04/29/16 1503  BP: (!) 156/83  Pulse: 98  Temp: 98.4 F (36.9 C)  TempSrc: Oral  SpO2: 97%  Weight: 80 lb 9.6 oz (36.6 kg)  Height: 4' 6.5" (1.384 m)      Physical Exam  Constitutional: She is oriented to person, place, and time. She appears well-developed and well-nourished.  Cardiovascular: Normal rate, normal heart sounds and intact distal pulses.   No murmur heard. Pulmonary/Chest: Effort normal and breath sounds normal. She has no wheezes. She has no rales. She exhibits no tenderness.  Abdominal: Soft. Bowel sounds are normal. She exhibits no distension and no mass. There is no tenderness.  Musculoskeletal: Normal range of motion.  Neurological: She is alert and oriented to person, place, and time.  Skin: Skin is warm and dry.  Psychiatric:  She has a normal mood and affect.     Lipid Panel     Component Value Date/Time   CHOL 253 (H) 02/06/2016 1529   TRIG 553 (H) 02/06/2016 1529   HDL 36 (L) 02/06/2016 1529   CHOLHDL 7.0 (H) 02/06/2016 1529   VLDL NOT CALC 02/06/2016 1529   LDLCALC NOT CALC 02/06/2016 1529   LDLDIRECT 107.1 05/25/2012 1120     Lab Results    Component Value Date   HGBA1C 10.9 04/29/2016       Assessment & Plan:  1. Type 2 diabetes mellitus without complication, with long-term current use of insulin (HCC) Uncontrolled with A1c of 10.9 which has trended down compared to 14.0 previously. Blood sugar log reveals improvement Increase glipizide to 5 mg twice daily  2. Hyperlipidemia Last lipid panel was uncontrolled Low cholesterol diet - atorvastatin (LIPITOR) 20 MG tablet; Take 1 tablet (20 mg total) by mouth daily.  Dispense: 30 tablet; Refill: 3  3. Essential hypertension Uncontrolled Lisinopril added to her regimen; continue metoprolol - metoprolol tartrate (LOPRESSOR) 25 MG tablet; Take 1 tablet (25 mg total) by mouth 2 (two) times daily.  Dispense: 60 tablet; Refill: 3  4. Poor social situation Sister would like to get the patient into a group home. She has an appointment at social services in 2 days I have explained that she will need to see a psychiatrist and/or psychologist to diagnose any follow-up learning disorder. The option of [facility] has been provided to the patient as she currently has no medical coverage.  5.Right eye proliferative diabetic retinopathy Status post panretinal photocoagulation  Left retinal detachment Keep appointment with ophthalmology  This note has been created with Dragon speech recognition software and smart phrase technology. Any transcriptional errors are unintentional.

## 2016-04-29 NOTE — Patient Instructions (Signed)
Diabetes Mellitus and Food It is important for you to manage your blood sugar (glucose) level. Your blood glucose level can be greatly affected by what you eat. Eating healthier foods in the appropriate amounts throughout the day at about the same time each day will help you control your blood glucose level. It can also help slow or prevent worsening of your diabetes mellitus. Healthy eating may even help you improve the level of your blood pressure and reach or maintain a healthy weight.  General recommendations for healthful eating and cooking habits include:  Eating meals and snacks regularly. Avoid going long periods of time without eating to lose weight.  Eating a diet that consists mainly of plant-based foods, such as fruits, vegetables, nuts, legumes, and whole grains.  Using low-heat cooking methods, such as baking, instead of high-heat cooking methods, such as deep frying. Work with your dietitian to make sure you understand how to use the Nutrition Facts information on food labels. HOW CAN FOOD AFFECT ME? Carbohydrates Carbohydrates affect your blood glucose level more than any other type of food. Your dietitian will help you determine how many carbohydrates to eat at each meal and teach you how to count carbohydrates. Counting carbohydrates is important to keep your blood glucose at a healthy level, especially if you are using insulin or taking certain medicines for diabetes mellitus. Alcohol Alcohol can cause sudden decreases in blood glucose (hypoglycemia), especially if you use insulin or take certain medicines for diabetes mellitus. Hypoglycemia can be a life-threatening condition. Symptoms of hypoglycemia (sleepiness, dizziness, and disorientation) are similar to symptoms of having too much alcohol.  If your health care provider has given you approval to drink alcohol, do so in moderation and use the following guidelines:  Women should not have more than one drink per day, and men  should not have more than two drinks per day. One drink is equal to:  12 oz of beer.  5 oz of wine.  1 oz of hard liquor.  Do not drink on an empty stomach.  Keep yourself hydrated. Have water, diet soda, or unsweetened iced tea.  Regular soda, juice, and other mixers might contain a lot of carbohydrates and should be counted. WHAT FOODS ARE NOT RECOMMENDED? As you make food choices, it is important to remember that all foods are not the same. Some foods have fewer nutrients per serving than other foods, even though they might have the same number of calories or carbohydrates. It is difficult to get your body what it needs when you eat foods with fewer nutrients. Examples of foods that you should avoid that are high in calories and carbohydrates but low in nutrients include:  Trans fats (most processed foods list trans fats on the Nutrition Facts label).  Regular soda.  Juice.  Candy.  Sweets, such as cake, pie, doughnuts, and cookies.  Fried foods. WHAT FOODS CAN I EAT? Eat nutrient-rich foods, which will nourish your body and keep you healthy. The food you should eat also will depend on several factors, including:  The calories you need.  The medicines you take.  Your weight.  Your blood glucose level.  Your blood pressure level.  Your cholesterol level. You should eat a variety of foods, including:  Protein.  Lean cuts of meat.  Proteins low in saturated fats, such as fish, egg whites, and beans. Avoid processed meats.  Fruits and vegetables.  Fruits and vegetables that may help control blood glucose levels, such as apples, mangoes, and   yams.  Dairy products.  Choose fat-free or low-fat dairy products, such as milk, yogurt, and cheese.  Grains, bread, pasta, and rice.  Choose whole grain products, such as multigrain bread, whole oats, and brown rice. These foods may help control blood pressure.  Fats.  Foods containing healthful fats, such as nuts,  avocado, olive oil, canola oil, and fish. DOES EVERYONE WITH DIABETES MELLITUS HAVE THE SAME MEAL PLAN? Because every person with diabetes mellitus is different, there is not one meal plan that works for everyone. It is very important that you meet with a dietitian who will help you create a meal plan that is just right for you.   This information is not intended to replace advice given to you by your health care provider. Make sure you discuss any questions you have with your health care provider.   Document Released: 03/06/2005 Document Revised: 06/30/2014 Document Reviewed: 05/06/2013 Elsevier Interactive Patient Education 2016 Elsevier Inc.  

## 2016-05-09 MED FILL — ?GLIPIZIDE 5 MG TABLET: 5 MG | 15 days supply | Qty: 30 | Fill #0

## 2016-05-09 MED FILL — METFORMIN HCL ER 500 MG TAB: 500 | 30 days supply | Qty: 120 | Fill #0

## 2016-05-26 MED FILL — ATORVASTATIN 20 MG TABLET: 20 | 30 days supply | Qty: 30 | Fill #2

## 2016-05-26 MED FILL — TRUEplus LANCETS 28G MISC: 25 days supply | Qty: 100 | Fill #2

## 2016-05-26 MED FILL — LISINOPRIL 5 MG TABLET: 5 | 30 days supply | Qty: 30 | Fill #1

## 2016-05-26 MED FILL — TRUE METRIX TEST STRIP: 25 days supply | Qty: 100 | Fill #3

## 2016-05-26 MED FILL — ?GLIPIZIDE 5 MG TABLET: 5 MG | 15 days supply | Qty: 30 | Fill #1

## 2016-05-26 MED FILL — METOPROLOL TARTRATE 25 MG T: 25 | 30 days supply | Qty: 60 | Fill #2

## 2016-06-11 ENCOUNTER — Encounter (HOSPITAL_COMMUNITY): Payer: Self-pay | Admitting: *Deleted

## 2016-06-11 ENCOUNTER — Inpatient Hospital Stay (HOSPITAL_COMMUNITY): Payer: Medicaid Other

## 2016-06-11 ENCOUNTER — Inpatient Hospital Stay (HOSPITAL_COMMUNITY)
Admission: EM | Admit: 2016-06-11 | Discharge: 2016-07-01 | DRG: 872 | Disposition: A | Discharge status: Skilled Nursing Facility | Payer: Medicaid Other | Attending: Family Medicine | Admitting: Family Medicine

## 2016-06-11 DIAGNOSIS — K219 Gastro-esophageal reflux disease without esophagitis: Secondary | ICD-10-CM | POA: Diagnosis present

## 2016-06-11 DIAGNOSIS — R531 Weakness: Secondary | ICD-10-CM | POA: Diagnosis present

## 2016-06-11 DIAGNOSIS — I5033 Acute on chronic diastolic (congestive) heart failure: Secondary | ICD-10-CM

## 2016-06-11 DIAGNOSIS — E785 Hyperlipidemia, unspecified: Secondary | ICD-10-CM | POA: Diagnosis present

## 2016-06-11 DIAGNOSIS — R55 Syncope and collapse: Secondary | ICD-10-CM | POA: Diagnosis present

## 2016-06-11 DIAGNOSIS — E118 Type 2 diabetes mellitus with unspecified complications: Secondary | ICD-10-CM

## 2016-06-11 DIAGNOSIS — N390 Urinary tract infection, site not specified: Secondary | ICD-10-CM

## 2016-06-11 DIAGNOSIS — Z1612 Extended spectrum beta lactamase (ESBL) resistance: Secondary | ICD-10-CM | POA: Diagnosis present

## 2016-06-11 DIAGNOSIS — N179 Acute kidney failure, unspecified: Secondary | ICD-10-CM | POA: Diagnosis present

## 2016-06-11 DIAGNOSIS — B9629 Other Escherichia coli [E. coli] as the cause of diseases classified elsewhere: Secondary | ICD-10-CM

## 2016-06-11 DIAGNOSIS — E876 Hypokalemia: Secondary | ICD-10-CM | POA: Diagnosis present

## 2016-06-11 DIAGNOSIS — Z794 Long term (current) use of insulin: Secondary | ICD-10-CM

## 2016-06-11 DIAGNOSIS — I509 Heart failure, unspecified: Secondary | ICD-10-CM

## 2016-06-11 DIAGNOSIS — A084 Viral intestinal infection, unspecified: Secondary | ICD-10-CM | POA: Diagnosis present

## 2016-06-11 DIAGNOSIS — R7989 Other specified abnormal findings of blood chemistry: Secondary | ICD-10-CM

## 2016-06-11 DIAGNOSIS — IMO0002 Reserved for concepts with insufficient information to code with codable children: Secondary | ICD-10-CM

## 2016-06-11 DIAGNOSIS — F17210 Nicotine dependence, cigarettes, uncomplicated: Secondary | ICD-10-CM | POA: Diagnosis present

## 2016-06-11 DIAGNOSIS — N151 Renal and perinephric abscess: Secondary | ICD-10-CM

## 2016-06-11 DIAGNOSIS — R509 Fever, unspecified: Secondary | ICD-10-CM

## 2016-06-11 DIAGNOSIS — R6889 Other general symptoms and signs: Secondary | ICD-10-CM

## 2016-06-11 DIAGNOSIS — K523 Indeterminate colitis: Secondary | ICD-10-CM

## 2016-06-11 DIAGNOSIS — Z8249 Family history of ischemic heart disease and other diseases of the circulatory system: Secondary | ICD-10-CM | POA: Diagnosis not present

## 2016-06-11 DIAGNOSIS — N189 Chronic kidney disease, unspecified: Secondary | ICD-10-CM

## 2016-06-11 DIAGNOSIS — E872 Acidosis: Secondary | ICD-10-CM | POA: Diagnosis present

## 2016-06-11 DIAGNOSIS — D631 Anemia in chronic kidney disease: Secondary | ICD-10-CM | POA: Diagnosis present

## 2016-06-11 DIAGNOSIS — I69311 Memory deficit following cerebral infarction: Secondary | ICD-10-CM

## 2016-06-11 DIAGNOSIS — R627 Adult failure to thrive: Secondary | ICD-10-CM | POA: Diagnosis present

## 2016-06-11 DIAGNOSIS — A419 Sepsis, unspecified organism: Secondary | ICD-10-CM

## 2016-06-11 DIAGNOSIS — E1142 Type 2 diabetes mellitus with diabetic polyneuropathy: Secondary | ICD-10-CM | POA: Diagnosis present

## 2016-06-11 DIAGNOSIS — H548 Legal blindness, as defined in USA: Secondary | ICD-10-CM | POA: Diagnosis present

## 2016-06-11 DIAGNOSIS — E1129 Type 2 diabetes mellitus with other diabetic kidney complication: Secondary | ICD-10-CM

## 2016-06-11 DIAGNOSIS — R140 Abdominal distension (gaseous): Secondary | ICD-10-CM

## 2016-06-11 DIAGNOSIS — Z833 Family history of diabetes mellitus: Secondary | ICD-10-CM | POA: Diagnosis not present

## 2016-06-11 DIAGNOSIS — K529 Noninfective gastroenteritis and colitis, unspecified: Secondary | ICD-10-CM

## 2016-06-11 DIAGNOSIS — M6281 Muscle weakness (generalized): Secondary | ICD-10-CM

## 2016-06-11 DIAGNOSIS — Z841 Family history of disorders of kidney and ureter: Secondary | ICD-10-CM | POA: Diagnosis not present

## 2016-06-11 DIAGNOSIS — E1165 Type 2 diabetes mellitus with hyperglycemia: Secondary | ICD-10-CM | POA: Diagnosis present

## 2016-06-11 DIAGNOSIS — Z823 Family history of stroke: Secondary | ICD-10-CM | POA: Diagnosis not present

## 2016-06-11 DIAGNOSIS — E1122 Type 2 diabetes mellitus with diabetic chronic kidney disease: Secondary | ICD-10-CM | POA: Diagnosis present

## 2016-06-11 DIAGNOSIS — D72829 Elevated white blood cell count, unspecified: Secondary | ICD-10-CM

## 2016-06-11 DIAGNOSIS — I10 Essential (primary) hypertension: Secondary | ICD-10-CM

## 2016-06-11 DIAGNOSIS — I130 Hypertensive heart and chronic kidney disease with heart failure and stage 1 through stage 4 chronic kidney disease, or unspecified chronic kidney disease: Secondary | ICD-10-CM | POA: Diagnosis present

## 2016-06-11 DIAGNOSIS — A4151 Sepsis due to Escherichia coli [E. coli]: Secondary | ICD-10-CM | POA: Diagnosis present

## 2016-06-11 DIAGNOSIS — Z7982 Long term (current) use of aspirin: Secondary | ICD-10-CM | POA: Diagnosis not present

## 2016-06-11 DIAGNOSIS — W182XXA Fall in (into) shower or empty bathtub, initial encounter: Secondary | ICD-10-CM | POA: Diagnosis present

## 2016-06-11 DIAGNOSIS — E08311 Diabetes mellitus due to underlying condition with unspecified diabetic retinopathy with macular edema: Secondary | ICD-10-CM

## 2016-06-11 DIAGNOSIS — G629 Polyneuropathy, unspecified: Secondary | ICD-10-CM | POA: Diagnosis present

## 2016-06-11 DIAGNOSIS — N10 Acute tubulo-interstitial nephritis: Secondary | ICD-10-CM

## 2016-06-11 DIAGNOSIS — R7881 Bacteremia: Secondary | ICD-10-CM

## 2016-06-11 DIAGNOSIS — I5032 Chronic diastolic (congestive) heart failure: Secondary | ICD-10-CM | POA: Diagnosis present

## 2016-06-11 DIAGNOSIS — E875 Hyperkalemia: Secondary | ICD-10-CM | POA: Diagnosis present

## 2016-06-11 DIAGNOSIS — Z66 Do not resuscitate: Secondary | ICD-10-CM | POA: Diagnosis present

## 2016-06-11 DIAGNOSIS — E113513 Type 2 diabetes mellitus with proliferative diabetic retinopathy with macular edema, bilateral: Secondary | ICD-10-CM | POA: Diagnosis present

## 2016-06-11 DIAGNOSIS — B9620 Unspecified Escherichia coli [E. coli] as the cause of diseases classified elsewhere: Secondary | ICD-10-CM

## 2016-06-11 DIAGNOSIS — Z79899 Other long term (current) drug therapy: Secondary | ICD-10-CM

## 2016-06-11 DIAGNOSIS — R197 Diarrhea, unspecified: Secondary | ICD-10-CM

## 2016-06-11 DIAGNOSIS — R109 Unspecified abdominal pain: Secondary | ICD-10-CM

## 2016-06-11 DIAGNOSIS — R32 Unspecified urinary incontinence: Secondary | ICD-10-CM | POA: Diagnosis present

## 2016-06-11 DIAGNOSIS — R000 Tachycardia, unspecified: Secondary | ICD-10-CM | POA: Diagnosis present

## 2016-06-11 LAB — URINALYSIS, ROUTINE W REFLEX MICROSCOPIC
Bilirubin Urine: NEGATIVE
GLUCOSE, UA: 50 mg/dL — AB
Ketones, ur: NEGATIVE mg/dL
NITRITE: NEGATIVE
Protein, ur: 30 mg/dL — AB
SPECIFIC GRAVITY, URINE: 1.006 (ref 1.005–1.030)
pH: 5 (ref 5.0–8.0)

## 2016-06-11 LAB — I-STAT CG4 LACTIC ACID, ED: Lactic Acid, Venous: 3.84 mmol/L (ref 0.5–1.9)

## 2016-06-11 LAB — CBC WITH DIFFERENTIAL/PLATELET
BASOS PCT: 0 %
Basophils Absolute: 0 10*3/uL (ref 0.0–0.1)
EOS ABS: 0 10*3/uL (ref 0.0–0.7)
Eosinophils Relative: 0 %
HEMATOCRIT: 22.4 % — AB (ref 36.0–46.0)
HEMOGLOBIN: 7.6 g/dL — AB (ref 12.0–15.0)
LYMPHS PCT: 7 %
Lymphs Abs: 1.9 10*3/uL (ref 0.7–4.0)
MCH: 26.7 pg (ref 26.0–34.0)
MCHC: 33.9 g/dL (ref 30.0–36.0)
MCV: 78.6 fL (ref 78.0–100.0)
Monocytes Absolute: 0.8 10*3/uL (ref 0.1–1.0)
Monocytes Relative: 3 %
NEUTROS ABS: 24.6 10*3/uL — AB (ref 1.7–7.7)
Neutrophils Relative %: 90 %
Platelets: 566 10*3/uL — ABNORMAL HIGH (ref 150–400)
RBC: 2.85 MIL/uL — ABNORMAL LOW (ref 3.87–5.11)
RDW: 14.3 % (ref 11.5–15.5)
WBC: 27.3 10*3/uL — ABNORMAL HIGH (ref 4.0–10.5)

## 2016-06-11 LAB — COMPREHENSIVE METABOLIC PANEL
ALK PHOS: 68 U/L (ref 38–126)
ALT: 11 U/L — ABNORMAL LOW (ref 14–54)
ANION GAP: 16 — AB (ref 5–15)
AST: 22 U/L (ref 15–41)
Albumin: 2 g/dL — ABNORMAL LOW (ref 3.5–5.0)
BILIRUBIN TOTAL: 0.5 mg/dL (ref 0.3–1.2)
BUN: 43 mg/dL — ABNORMAL HIGH (ref 6–20)
CALCIUM: 6.6 mg/dL — AB (ref 8.9–10.3)
CO2: 25 mmol/L (ref 22–32)
Chloride: 90 mmol/L — ABNORMAL LOW (ref 101–111)
Creatinine, Ser: 2.49 mg/dL — ABNORMAL HIGH (ref 0.44–1.00)
GFR, EST AFRICAN AMERICAN: 24 mL/min — AB (ref >60)
GFR, EST NON AFRICAN AMERICAN: 21 mL/min — AB (ref >60)
Glucose, Bld: 279 mg/dL — ABNORMAL HIGH (ref 65–99)
Potassium: 2.1 mmol/L — CL (ref 3.5–5.1)
Sodium: 131 mmol/L — ABNORMAL LOW (ref 135–145)
TOTAL PROTEIN: 6.4 g/dL — AB (ref 6.5–8.1)

## 2016-06-11 LAB — PHOSPHORUS: Phosphorus: 2.3 mg/dL — ABNORMAL LOW (ref 2.5–4.6)

## 2016-06-11 LAB — GLUCOSE, CAPILLARY: GLUCOSE-CAPILLARY: 80 mg/dL (ref 65–99)

## 2016-06-11 LAB — MAGNESIUM: MAGNESIUM: 1.1 mg/dL — AB (ref 1.7–2.4)

## 2016-06-11 LAB — TSH: TSH: 1.033 u[IU]/mL (ref 0.350–4.500)

## 2016-06-11 LAB — TROPONIN I

## 2016-06-11 LAB — POC OCCULT BLOOD, ED: Fecal Occult Bld: NEGATIVE

## 2016-06-11 LAB — MRSA PCR SCREENING: MRSA BY PCR: NEGATIVE

## 2016-06-11 MED ORDER — POTASSIUM CHLORIDE CRYS ER 20 MEQ PO TBCR
40.0000 meq | EXTENDED_RELEASE_TABLET | Freq: Once | ORAL | Status: AC
  Administered 2016-06-11: 40 meq via ORAL
  Filled 2016-06-11: qty 2

## 2016-06-11 MED ORDER — ATORVASTATIN CALCIUM 10 MG PO TABS
20.0000 mg | ORAL_TABLET | Freq: Every day | ORAL | Status: DC
  Administered 2016-06-12 – 2016-07-01 (×20): 20 mg via ORAL
  Filled 2016-06-11 (×20): qty 2

## 2016-06-11 MED ORDER — INSULIN ASPART 100 UNIT/ML ~~LOC~~ SOLN
0.0000 [IU] | Freq: Every day | SUBCUTANEOUS | Status: DC
  Administered 2016-06-12: 3 [IU] via SUBCUTANEOUS
  Administered 2016-06-13: 4 [IU] via SUBCUTANEOUS
  Administered 2016-06-15 – 2016-06-17 (×2): 2 [IU] via SUBCUTANEOUS
  Administered 2016-06-18: 3 [IU] via SUBCUTANEOUS
  Administered 2016-06-21 – 2016-06-24 (×2): 2 [IU] via SUBCUTANEOUS
  Administered 2016-06-29: 3 [IU] via SUBCUTANEOUS

## 2016-06-11 MED ORDER — POTASSIUM CHLORIDE 10 MEQ/100ML IV SOLN
10.0000 meq | INTRAVENOUS | Status: AC
  Administered 2016-06-11 (×3): 10 meq via INTRAVENOUS
  Filled 2016-06-11 (×3): qty 100

## 2016-06-11 MED ORDER — IOPAMIDOL (ISOVUE-300) INJECTION 61%
30.0000 mL | Freq: Once | INTRAVENOUS | Status: AC | PRN
Start: 2016-06-11 — End: 2016-06-11
  Administered 2016-06-11: 30 mL via ORAL

## 2016-06-11 MED ORDER — INSULIN ASPART 100 UNIT/ML ~~LOC~~ SOLN
0.0000 [IU] | Freq: Three times a day (TID) | SUBCUTANEOUS | Status: DC
  Administered 2016-06-12: 5 [IU] via SUBCUTANEOUS
  Administered 2016-06-12: 3 [IU] via SUBCUTANEOUS
  Administered 2016-06-13: 8 [IU] via SUBCUTANEOUS
  Administered 2016-06-13: 5 [IU] via SUBCUTANEOUS
  Administered 2016-06-13 – 2016-06-14 (×2): 8 [IU] via SUBCUTANEOUS
  Administered 2016-06-14 (×2): 5 [IU] via SUBCUTANEOUS
  Administered 2016-06-15: 15 [IU] via SUBCUTANEOUS
  Administered 2016-06-15 – 2016-06-16 (×3): 5 [IU] via SUBCUTANEOUS
  Administered 2016-06-16: 8 [IU] via SUBCUTANEOUS
  Administered 2016-06-16: 15 [IU] via SUBCUTANEOUS
  Administered 2016-06-17: 2 [IU] via SUBCUTANEOUS
  Administered 2016-06-17: 8 [IU] via SUBCUTANEOUS
  Administered 2016-06-17: 11 [IU] via SUBCUTANEOUS
  Administered 2016-06-18: 8 [IU] via SUBCUTANEOUS
  Administered 2016-06-18 (×2): 5 [IU] via SUBCUTANEOUS
  Administered 2016-06-19: 3 [IU] via SUBCUTANEOUS
  Administered 2016-06-19: 11 [IU] via SUBCUTANEOUS
  Administered 2016-06-19 – 2016-06-20 (×2): 8 [IU] via SUBCUTANEOUS
  Administered 2016-06-20: 11 [IU] via SUBCUTANEOUS
  Administered 2016-06-20: 5 [IU] via SUBCUTANEOUS
  Administered 2016-06-21 (×2): 3 [IU] via SUBCUTANEOUS
  Administered 2016-06-22: 5 [IU] via SUBCUTANEOUS
  Administered 2016-06-22: 2 [IU] via SUBCUTANEOUS
  Administered 2016-06-23 (×2): 5 [IU] via SUBCUTANEOUS
  Administered 2016-06-24: 2 [IU] via SUBCUTANEOUS
  Administered 2016-06-25: 15 [IU] via SUBCUTANEOUS
  Administered 2016-06-25: 2 [IU] via SUBCUTANEOUS
  Administered 2016-06-26: 15 [IU] via SUBCUTANEOUS
  Administered 2016-06-27: 3 [IU] via SUBCUTANEOUS
  Administered 2016-06-27 (×2): 2 [IU] via SUBCUTANEOUS
  Administered 2016-06-28 (×2): 5 [IU] via SUBCUTANEOUS
  Administered 2016-06-29 (×3): 3 [IU] via SUBCUTANEOUS

## 2016-06-11 MED ORDER — DEXTROSE 5 % IV SOLN
1.0000 g | INTRAVENOUS | Status: DC
  Administered 2016-06-12: 1 g via INTRAVENOUS
  Filled 2016-06-11: qty 10

## 2016-06-11 MED ORDER — SODIUM CHLORIDE 0.9 % IV BOLUS (SEPSIS)
1000.0000 mL | Freq: Once | INTRAVENOUS | Status: AC
  Administered 2016-06-11: 1000 mL via INTRAVENOUS

## 2016-06-11 MED ORDER — SODIUM CHLORIDE 0.9 % IV SOLN
INTRAVENOUS | Status: DC
  Administered 2016-06-11: 17:00:00 via INTRAVENOUS

## 2016-06-11 MED ORDER — ADULT MULTIVITAMIN W/MINERALS CH
1.0000 | ORAL_TABLET | Freq: Every day | ORAL | Status: DC
  Administered 2016-06-12 – 2016-07-01 (×20): 1 via ORAL
  Filled 2016-06-11 (×20): qty 1

## 2016-06-11 MED ORDER — SODIUM CHLORIDE 0.9 % IV SOLN
INTRAVENOUS | Status: DC
  Administered 2016-06-11 – 2016-06-14 (×5): via INTRAVENOUS

## 2016-06-11 MED ORDER — CALCIUM CARBONATE ANTACID 500 MG PO CHEW
1.0000 | CHEWABLE_TABLET | Freq: Two times a day (BID) | ORAL | Status: DC
  Administered 2016-06-11 – 2016-06-17 (×12): 200 mg via ORAL
  Filled 2016-06-11 (×12): qty 1

## 2016-06-11 MED ORDER — ACETAMINOPHEN 650 MG RE SUPP: 650.0000 mg | Freq: Four times a day (QID) | RECTAL | Status: DC | PRN

## 2016-06-11 MED ORDER — ACETAMINOPHEN 325 MG PO TABS
650.0000 mg | ORAL_TABLET | Freq: Four times a day (QID) | ORAL | Status: DC | PRN
  Administered 2016-06-11 – 2016-06-12 (×3): 650 mg via ORAL
  Filled 2016-06-11 (×3): qty 2

## 2016-06-11 MED ORDER — SODIUM CHLORIDE 0.9% FLUSH
3.0000 mL | Freq: Two times a day (BID) | INTRAVENOUS | Status: DC
  Administered 2016-06-11 – 2016-06-27 (×21): 3 mL via INTRAVENOUS

## 2016-06-11 MED ORDER — SODIUM CHLORIDE 0.9 % IV SOLN: 30.0000 meq | Freq: Once | INTRAVENOUS | Status: DC

## 2016-06-11 NOTE — ED Triage Notes (Signed)
Per EMS, pt is from home and called 911 after experiencing a fall in the shower. Pt reports feeling weak when standing and 3 episodes of diarrhea. Pt reports having ongoing episodes of n/v/d in the last month. Pt has a hx hypertension, diabetes, and stroke. Pt is partially blind, pt can only see from her right eye.

## 2016-06-11 NOTE — ED Notes (Signed)
MD notified of CG4 results

## 2016-06-11 NOTE — ED Notes (Signed)
Pt back from CT.  Attempted to call report.  Will call back in a few minutes.

## 2016-06-11 NOTE — ED Notes (Signed)
Pt waiting for CT scan prior to transport.

## 2016-06-11 NOTE — H&P (Signed)
History and Physical    [name] Michael EGB:[id number] DOB: 25-Mar-1961 DOA: 06/11/2016  PCP: Arnoldo Morale, MD  Patient coming from: home  Chief Complaint: weakness  HPI: [name] Michael is a 55 y.o. female with medical history significant of diabetes mellitus, hyperlipidemia, hypertension, stroke. Patient is a poor historian but states that for several days she has been having diarrhea, nausea and vomiting, and weakness. The weakness has been progressively getting worse. Nothing she is aware of makes it better. She presented to the hospital after almost falling today while taking a shower. Given that the problem was persistent and gradually getting worse she presented for further evaluation of the emergency department. She states that she has not been eating well for 2 weeks and that she feels like she had the flu a couple weeks ago.  ED Course: Patient was found to be hypokalemic, with elevated serum creatinine, hypocalcemic, leukocytosis, and urinalysis suspicious for UTI. We were subsequently consulted for further medical evaluation recommendations. Of note she was also found to have prolonged QT interval  Review of Systems: As per HPI otherwise 10 point review of systems negative.    Past Medical History:  Diagnosis Date  . Diabetes mellitus   . Hyperlipidemia   . Hypertension   . Stroke [facility]) 04-01-11   left frontal subcortical, saw Dr. [name]   . TIA (transient ischemic attack) 03-12-11    Past Surgical History:  Procedure Laterality Date  . OPEN REDUCTION INTERNAL FIXATION (ORIF) DISTAL RADIAL FRACTURE Left 01/28/2016   Procedure: OPEN REDUCTION INTERNAL FIXATION (ORIF) DISTAL RADIAL FRACTURE;  Surgeon: Iran Planas, MD;  Location: [place];  Service: Orthopedics;  Laterality: Left;  . [place]     age 24     reports that she has been smoking Cigarettes.  She has a 32.00 pack-year smoking history. She has never  used smokeless tobacco. She reports that she does not drink alcohol or use drugs.  Allergies  Allergen Reactions  . Hydrocodone Nausea And Vomiting    Family History  Problem Relation Age of Onset  . Stroke Mother   . Diabetes Mother   . Kidney failure Mother   . Heart failure Mother   . Stroke Father   . Cancer Sister     Breast- 68's    Prior to Admission medications   Medication Sig Start Date End Date Taking? Authorizing Provider  aspirin 81 MG tablet Take 1 tablet (81 mg total) by mouth daily. 08/24/12  Yes Reyne Dumas, MD  atorvastatin (LIPITOR) 20 MG tablet Take 1 tablet (20 mg total) by mouth daily. 04/29/16  Yes Arnoldo Morale, MD  glipiZIDE (GLUCOTROL) 5 MG tablet Take 1 tablet (5 mg total) by mouth 2 (two) times daily before a meal. 04/29/16  Yes Arnoldo Morale, MD  ibuprofen (ADVIL,MOTRIN) 200 MG tablet Take 200 mg by mouth every 6 (six) hours as needed for moderate pain.   Yes Historical Provider, MD  lisinopril (PRINIVIL,ZESTRIL) 5 MG tablet Take 1 tablet (5 mg total) by mouth daily. 04/29/16  Yes Arnoldo Morale, MD  metFORMIN (GLUCOPHAGE XR) 500 MG 24 hr tablet Take 2 tablets (1,000 mg total) by mouth 2 (two) times daily with a meal. 04/29/16  Yes Arnoldo Morale, MD  metoprolol tartrate (LOPRESSOR) 25 MG tablet Take 1 tablet (25 mg total) by mouth 2 (two) times daily. 04/29/16  Yes Arnoldo Morale, MD  Multiple Vitamin (MULTIVITAMIN) tablet Take 1 tablet  by mouth daily.     Yes Historical Provider, MD  Blood Glucose Monitoring Suppl (TRUE METRIX METER) w/Device KIT Use as directed 02/06/16   Lisabeth Pick, MD  glucose blood (TRUE METRIX BLOOD GLUCOSE TEST) test strip Use as instructed 02/06/16   Lisabeth Pick, MD  Insulin Pen Needle 31G X 5 MM MISC Use as directed 02/06/16   Lisabeth Pick, MD  TRUEPLUS LANCETS 28G MISC Use as directed 02/06/16   Lisabeth Pick, MD    Physical Exam: Vitals:   06/11/16 1257 06/11/16 1300 06/11/16 1301  BP:  131/70 131/70  Pulse:  92 91  Resp:  14 21   Temp:   98.1 F (36.7 C)  TempSrc:   Oral  SpO2: 99% 92% 93%    Constitutional: NAD, calm, comfortable, in nad. Vitals:   06/11/16 1257 06/11/16 1300 06/11/16 1301  BP:  131/70 131/70  Pulse:  92 91  Resp:  14 21  Temp:   98.1 F (36.7 C)  TempSrc:   Oral  SpO2: 99% 92% 93%   Eyes: PERRL, lids and conjunctivae normal ENMT: Mucous membranes are dry, Posterior pharynx clear of any exudate or lesions.  Normal dentition.  Neck: normal, supple, no masses, no thyromegaly Respiratory: clear to auscultation bilaterally, no wheezing, no crackles. Normal respiratory effort. No accessory muscle use.  Cardiovascular: Regular rate and rhythm, no murmurs / rubs / gallops.  Abdomen: no tenderness, no masses palpated. N Bowel sounds positive.  Musculoskeletal: no clubbing / cyanosis.  Skin: no rashes, lesions, ulcers. No induration Neurologic: No facial asymmetry, sensation to light touch intact Psychiatric: Normal judgment and poor insight.  Normal mood and affect.    Labs on Admission: I have personally reviewed following labs and imaging studies  CBC:  Recent Labs Lab 06/11/16 1425  WBC 27.3*  NEUTROABS 24.6*  HGB 7.6*  HCT 22.4*  MCV 78.6  PLT 482*   Basic Metabolic Panel:  Recent Labs Lab 06/11/16 1425  NA 131*  K 2.1*  CL 90*  CO2 25  GLUCOSE 279*  BUN 43*  CREATININE 2.49*  CALCIUM 6.6*  MG 1.1*   GFR: CrCl cannot be calculated (Unknown ideal weight.). Liver Function Tests:  Recent Labs Lab 06/11/16 1425  AST 22  ALT 11*  ALKPHOS 68  BILITOT 0.5  PROT 6.4*  ALBUMIN 2.0*   No results for input(s): LIPASE, AMYLASE in the last 168 hours. No results for input(s): AMMONIA in the last 168 hours. Coagulation Profile: No results for input(s): INR, PROTIME in the last 168 hours. Cardiac Enzymes:  Recent Labs Lab 06/11/16 1425  TROPONINI <0.03   BNP (last 3 results) No results for input(s): PROBNP in the last 8760 hours. HbA1C: No results for  input(s): HGBA1C in the last 72 hours. CBG: No results for input(s): GLUCAP in the last 168 hours. Lipid Profile: No results for input(s): CHOL, HDL, LDLCALC, TRIG, CHOLHDL, LDLDIRECT in the last 72 hours. Thyroid Function Tests: No results for input(s): TSH, T4TOTAL, FREET4, T3FREE, THYROIDAB in the last 72 hours. Anemia Panel: No results for input(s): VITAMINB12, FOLATE, FERRITIN, TIBC, IRON, RETICCTPCT in the last 72 hours. Urine analysis:    Component Value Date/Time   COLORURINE YELLOW 06/11/2016 1433   APPEARANCEUR HAZY (A) 06/11/2016 1433   LABSPEC 1.006 06/11/2016 1433   PHURINE 5.0 06/11/2016 1433   GLUCOSEU 50 (A) 06/11/2016 1433   HGBUR LARGE (A) 06/11/2016 1433   BILIRUBINUR NEGATIVE 06/11/2016 1433   BILIRUBINUR n 05/25/2012  [place] 06/11/2016 1433   PROTEINUR 30 (A) 06/11/2016 1433   UROBILINOGEN 0.2 05/25/2012 1202   UROBILINOGEN 0.2 03/29/2011 0131   NITRITE NEGATIVE 06/11/2016 1433   LEUKOCYTESUR LARGE (A) 06/11/2016 1433   Sepsis Labs: !!!!!!!!!!!!!!!!!!!!!!!!!!!!!!!!!!!!!!!!!!!! @LABRCNTIP (procalcitonin:4,lacticidven:4) )No results found for this or any previous visit (from the past 240 hour(s)).   Radiological Exams on Admission: No results found.  EKG: Independently reviewed. Sinus rhythm with no ST elevations or depressions, prolonged QT interval  Assessment/Plan Active Problems:   Acute kidney injury ([place]) - secondary to poor oral intake and diarrhea while using ACEI - Will provide fluid rehydration  UTI - administer rocephin - f/u with cultures    Hypokalemia - replaced IV, then replaced orally - Will reassess next am. - check magnesium levels    Diabetes ([place]) - hold oral hypoglycemic agents - place on SSI with night coverage    Essential hypertension - Hold B blocker given principle problem    GERD -Continue PPI    Hypocalcemia - Start on calcium replacement   DVT prophylaxis: SCD's Code Status: DNR Family  Communication: none at bedside Disposition Plan: step down unit Consults called: None Admission status: inpatient   Velvet Bathe MD [facility] Pager [phone]  If 7PM-7AM, please contact night-coverage [email or web address] Password [email or web address]  06/11/2016, 5:02 PM

## 2016-06-11 NOTE — ED Notes (Signed)
Bed: WA01 Expected date:  Expected time:  Means of arrival:  Comments: EMS- 55yo, weak/dizzy

## 2016-06-11 NOTE — ED Provider Notes (Signed)
[place] DEPT Provider Note   CSN: [id number] Arrival date & time: 06/11/16  1251     History   Chief Complaint No chief complaint on file.   HPI [name] Michael is a 55 y.o. female.  55 year old female presents with one-month history of loose stools as well as intermittent vomiting which is been nonbilious or bloody. Denies any associated abdominal discomfort. No fever or chills. States that today she is in shower and she felt weak and had a near syncopal event. Denied any associated chest pain or chest pressure. She was not dyspneic. She did not strike her head. Has not seen her doctor for these symptoms. No recent changes to her medications. Nothing makes her symptoms better or worse. No treatment use prior to arrival      Past Medical History:  Diagnosis Date  . Diabetes mellitus   . Hyperlipidemia   . Hypertension   . Stroke [facility]) 04-01-11   left frontal subcortical, saw Dr. [name]   . TIA (transient ischemic attack) 03-12-11    Patient Active Problem List   Diagnosis Date Noted  . Proliferative diabetic retinopathy ([place]) 04/29/2016  . Closed fracture of left distal radius 01/28/2016  . Poor social situation 03/09/2013  . Depression 03/01/2013  . Abnormal mammogram 12/20/2012  . Retinal detachment 11/17/2012  . Diabetes ([place]) 02/09/2007  . Hyperlipidemia 02/09/2007  . Essential hypertension 02/09/2007  . GERD 02/09/2007    Past Surgical History:  Procedure Laterality Date  . OPEN REDUCTION INTERNAL FIXATION (ORIF) DISTAL RADIAL FRACTURE Left 01/28/2016   Procedure: OPEN REDUCTION INTERNAL FIXATION (ORIF) DISTAL RADIAL FRACTURE;  Surgeon: Iran Planas, MD;  Location: [place];  Service: Orthopedics;  Laterality: Left;  . [place]     age 67    OB History    No data available       Home Medications    Prior to Admission medications   Medication Sig Start Date End Date Taking?  Authorizing Provider  aspirin 81 MG tablet Take 1 tablet (81 mg total) by mouth daily. 08/24/12  Yes Reyne Dumas, MD  atorvastatin (LIPITOR) 20 MG tablet Take 1 tablet (20 mg total) by mouth daily. 04/29/16  Yes Arnoldo Morale, MD  glipiZIDE (GLUCOTROL) 5 MG tablet Take 1 tablet (5 mg total) by mouth 2 (two) times daily before a meal. 04/29/16  Yes Arnoldo Morale, MD  ibuprofen (ADVIL,MOTRIN) 200 MG tablet Take 200 mg by mouth every 6 (six) hours as needed for moderate pain.   Yes Historical Provider, MD  lisinopril (PRINIVIL,ZESTRIL) 5 MG tablet Take 1 tablet (5 mg total) by mouth daily. 04/29/16  Yes Arnoldo Morale, MD  metFORMIN (GLUCOPHAGE XR) 500 MG 24 hr tablet Take 2 tablets (1,000 mg total) by mouth 2 (two) times daily with a meal. 04/29/16  Yes Arnoldo Morale, MD  metoprolol tartrate (LOPRESSOR) 25 MG tablet Take 1 tablet (25 mg total) by mouth 2 (two) times daily. 04/29/16  Yes Arnoldo Morale, MD  Multiple Vitamin (MULTIVITAMIN) tablet Take 1 tablet by mouth daily.     Yes Historical Provider, MD  Blood Glucose Monitoring Suppl (TRUE METRIX METER) w/Device KIT Use as directed 02/06/16   Lisabeth Pick, MD  glucose blood (TRUE METRIX BLOOD GLUCOSE TEST) test strip Use as instructed 02/06/16   Lisabeth Pick, MD  Insulin Pen Needle 31G X 5 MM MISC Use as directed 02/06/16   Lisabeth Pick, MD  TRUEPLUS LANCETS 28G MISC Use as directed 02/06/16   Lisabeth Pick, MD    Family History Family History  Problem Relation Age of Onset  . Stroke Mother   . Diabetes Mother   . Kidney failure Mother   . Heart failure Mother   . Stroke Father   . Cancer Sister     Breast- 80's    Social History Social History  Substance Use Topics  . Smoking status: Current Every Day Smoker    Packs/day: 1.00    Years: 32.00    Types: Cigarettes    Last attempt to quit: 09/23/2012  . Smokeless tobacco: Never Used     Comment: or less  . Alcohol use No     Allergies   Hydrocodone   Review of Systems Review of  Systems  All other systems reviewed and are negative.    Physical Exam Updated Vital Signs BP 131/70   Pulse 91   Temp 98.1 F (36.7 C) (Oral)   Resp 21   LMP 03/06/2013   SpO2 93%   Physical Exam  Constitutional: She is oriented to person, place, and time. She appears well-developed and well-nourished.  Non-toxic appearance. No distress.  HENT:  Head: Normocephalic and atraumatic.  Eyes: Conjunctivae, EOM and lids are normal. Pupils are equal, round, and reactive to light.  Neck: Normal range of motion. Neck supple. No tracheal deviation present. No thyroid mass present.  Cardiovascular: Normal rate, regular rhythm and normal heart sounds.  Exam reveals no gallop.   No murmur heard. Pulmonary/Chest: Effort normal and breath sounds normal. No stridor. No respiratory distress. She has no decreased breath sounds. She has no wheezes. She has no rhonchi. She has no rales.  Abdominal: Soft. Normal appearance and bowel sounds are normal. She exhibits no distension. There is no tenderness. There is no rebound and no CVA tenderness.  Musculoskeletal: Normal range of motion. She exhibits no edema or tenderness.  Neurological: She is alert and oriented to person, place, and time. She has normal strength. No cranial nerve deficit or sensory deficit. GCS eye subscore is 4. GCS verbal subscore is 5. GCS motor subscore is 6.  Skin: Skin is warm and dry. No abrasion and no rash noted.  Psychiatric: She has a normal mood and affect. Her speech is normal and behavior is normal.  Nursing note and vitals reviewed.    ED Treatments / Results  Labs (all labs ordered are listed, but only abnormal results are displayed) Labs Reviewed  URINE CULTURE  CBC WITH DIFFERENTIAL/PLATELET  COMPREHENSIVE METABOLIC PANEL  URINALYSIS, ROUTINE W REFLEX MICROSCOPIC  TROPONIN I    EKG  EKG Interpretation  Date/Time:  Wednesday June 11 2016 13:00:37 EST Ventricular Rate:  91 PR Interval:    QRS  Duration: 98 QT Interval:  512 QTC Calculation: 631 R Axis:   93 Text Interpretation:  Sinus rhythm Borderline short PR interval Borderline right axis deviation Minimal ST depression, inferior leads Prolonged QT interval Confirmed by Zenia Resides  MD, ANTHONY ([id number]) on 06/11/2016 2:22:51 PM       Radiology No results found.  Procedures Procedures (including critical care time)  Medications Ordered in ED Medications  0.9 %  sodium chloride infusion (not administered)  sodium chloride 0.9 % bolus 1,000 mL (not administered)     Initial Impression / Assessment and Plan / ED Course  I have reviewed the triage vital signs and the nursing notes.  Pertinent labs & imaging results that were available during  my care of the patient were reviewed by me and considered in my medical decision making (see chart for details).  Clinical Course     Patient given IV potassium 4 her hypokalemia. Does have evidence of QT prolongation on her EKG. Has leukocytosis as well as anemia noted on CBC. She continues to deny abdominal pain at this time. We'll IV hydrate here as she has renal insufficiency likely from dehydration causing her vomiting and diarrhea. Patient to be admitted to stepdown  Final Clinical Impressions(s) / ED Diagnoses   Final diagnoses:  None    New Prescriptions New Prescriptions   No medications on file     Lacretia Leigh, MD 06/11/16 1545

## 2016-06-11 NOTE — Progress Notes (Signed)
PHARMACY NOTE -  Ceftriaxone  Pharmacy has been assisting with dosing of ceftriaxone for UTI. Dosage remains stable at 1 gr IV q24h and need for further dosage adjustment appears unlikely at present.    Will sign off at this time.  Please reconsult if a change in clinical status warrants re-evaluation of dosage.   Royetta Asal, PharmD, BCPS Pager [phone] 06/11/2016 5:02 PM

## 2016-06-12 ENCOUNTER — Inpatient Hospital Stay (HOSPITAL_COMMUNITY): Payer: Medicaid Other

## 2016-06-12 ENCOUNTER — Encounter (HOSPITAL_COMMUNITY): Payer: Self-pay

## 2016-06-12 DIAGNOSIS — E876 Hypokalemia: Secondary | ICD-10-CM

## 2016-06-12 LAB — BASIC METABOLIC PANEL
ANION GAP: 14 (ref 5–15)
Anion gap: 11 (ref 5–15)
BUN: 37 mg/dL — ABNORMAL HIGH (ref 6–20)
BUN: 37 mg/dL — ABNORMAL HIGH (ref 6–20)
CHLORIDE: 104 mmol/L (ref 101–111)
CO2: 23 mmol/L (ref 22–32)
CO2: 21 mmol/L — AB (ref 22–32)
CREATININE: 2.53 mg/dL — AB (ref 0.44–1.00)
Calcium: 7.3 mg/dL — ABNORMAL LOW (ref 8.9–10.3)
Calcium: 7 mg/dL — ABNORMAL LOW (ref 8.9–10.3)
Chloride: 104 mmol/L (ref 101–111)
Creatinine, Ser: 2.28 mg/dL — ABNORMAL HIGH (ref 0.44–1.00)
GFR calc Af Amer: 27 mL/min — ABNORMAL LOW (ref >60)
GFR calc non Af Amer: 23 mL/min — ABNORMAL LOW (ref >60)
GFR calc non Af Amer: 20 mL/min — ABNORMAL LOW (ref >60)
GFR, EST AFRICAN AMERICAN: 23 mL/min — AB (ref >60)
Glucose, Bld: 118 mg/dL — ABNORMAL HIGH (ref 65–99)
Glucose, Bld: 301 mg/dL — ABNORMAL HIGH (ref 65–99)
Potassium: 3.1 mmol/L — ABNORMAL LOW (ref 3.5–5.1)
Potassium: 3.9 mmol/L (ref 3.5–5.1)
Sodium: 138 mmol/L (ref 135–145)
Sodium: 139 mmol/L (ref 135–145)

## 2016-06-12 LAB — BLOOD CULTURE ID PANEL (REFLEXED)

## 2016-06-12 LAB — CBC WITH DIFFERENTIAL/PLATELET
BASOS PCT: 0 %
Basophils Absolute: 0 10*3/uL (ref 0.0–0.1)
EOS PCT: 0 %
Eosinophils Absolute: 0 10*3/uL (ref 0.0–0.7)
HCT: 22 % — ABNORMAL LOW (ref 36.0–46.0)
Hemoglobin: 7.3 g/dL — ABNORMAL LOW (ref 12.0–15.0)
LYMPHS PCT: 6 %
Lymphs Abs: 1.5 10*3/uL (ref 0.7–4.0)
MCH: 26.1 pg (ref 26.0–34.0)
MCHC: 33.2 g/dL (ref 30.0–36.0)
MCV: 78.6 fL (ref 78.0–100.0)
Monocytes Absolute: 0.2 10*3/uL (ref 0.1–1.0)
Monocytes Relative: 1 %
NEUTROS PCT: 93 %
Neutro Abs: 22.6 10*3/uL — ABNORMAL HIGH (ref 1.7–7.7)
Platelets: 624 10*3/uL — ABNORMAL HIGH (ref 150–400)
RBC: 2.8 MIL/uL — ABNORMAL LOW (ref 3.87–5.11)
RDW: 14.5 % (ref 11.5–15.5)
WBC MORPHOLOGY: INCREASED
WBC: 24.3 10*3/uL — ABNORMAL HIGH (ref 4.0–10.5)

## 2016-06-12 LAB — COMPREHENSIVE METABOLIC PANEL
ALK PHOS: 62 U/L (ref 38–126)
ALT: 9 U/L — AB (ref 14–54)
ANION GAP: 10 (ref 5–15)
AST: 16 U/L (ref 15–41)
Albumin: 1.7 g/dL — ABNORMAL LOW (ref 3.5–5.0)
BUN: 37 mg/dL — ABNORMAL HIGH (ref 6–20)
CALCIUM: 6 mg/dL — AB (ref 8.9–10.3)
CO2: 22 mmol/L (ref 22–32)
CREATININE: 1.97 mg/dL — AB (ref 0.44–1.00)
Chloride: 107 mmol/L (ref 101–111)
GFR, EST AFRICAN AMERICAN: 32 mL/min — AB (ref >60)
GFR, EST NON AFRICAN AMERICAN: 27 mL/min — AB (ref >60)
Glucose, Bld: 107 mg/dL — ABNORMAL HIGH (ref 65–99)
Potassium: 2.9 mmol/L — ABNORMAL LOW (ref 3.5–5.1)
SODIUM: 139 mmol/L (ref 135–145)
Total Bilirubin: 0.7 mg/dL (ref 0.3–1.2)
Total Protein: 5.1 g/dL — ABNORMAL LOW (ref 6.5–8.1)

## 2016-06-12 LAB — INFLUENZA PANEL BY PCR (TYPE A & B)
Influenza A By PCR: NEGATIVE
Influenza B By PCR: NEGATIVE

## 2016-06-12 LAB — CBC
HCT: 20.4 % — ABNORMAL LOW (ref 36.0–46.0)
HCT: 23.4 % — ABNORMAL LOW (ref 36.0–46.0)
HEMOGLOBIN: 7 g/dL — AB (ref 12.0–15.0)
Hemoglobin: 7.8 g/dL — ABNORMAL LOW (ref 12.0–15.0)
MCH: 27 pg (ref 26.0–34.0)
MCH: 27 pg (ref 26.0–34.0)
MCHC: 34.3 g/dL (ref 30.0–36.0)
MCHC: 33.3 g/dL (ref 30.0–36.0)
MCV: 78.8 fL (ref 78.0–100.0)
MCV: 81 fL (ref 78.0–100.0)
PLATELETS: 480 10*3/uL — AB (ref 150–400)
Platelets: 568 10*3/uL — ABNORMAL HIGH (ref 150–400)
RBC: 2.59 MIL/uL — AB (ref 3.87–5.11)
RBC: 2.89 MIL/uL — ABNORMAL LOW (ref 3.87–5.11)
RDW: 14.3 % (ref 11.5–15.5)
RDW: 14.5 % (ref 11.5–15.5)
WBC: 22.4 10*3/uL — AB (ref 4.0–10.5)
WBC: 24.1 10*3/uL — ABNORMAL HIGH (ref 4.0–10.5)

## 2016-06-12 LAB — RETICULOCYTES
RBC.: 2.92 MIL/uL — AB (ref 3.87–5.11)
RETIC COUNT ABSOLUTE: 32.1 10*3/uL (ref 19.0–186.0)
Retic Ct Pct: 1.1 % (ref 0.4–3.1)

## 2016-06-12 LAB — VITAMIN B12: VITAMIN B 12: 192 pg/mL (ref 180–914)

## 2016-06-12 LAB — ABO/RH: ABO/RH(D): A NEG

## 2016-06-12 LAB — IRON AND TIBC
Iron: 6 ug/dL — ABNORMAL LOW (ref 28–170)
Saturation Ratios: 3 % — ABNORMAL LOW (ref 10.4–31.8)
TIBC: 174 ug/dL — ABNORMAL LOW (ref 250–450)
UIBC: 168 ug/dL

## 2016-06-12 LAB — GLUCOSE, CAPILLARY
GLUCOSE-CAPILLARY: 104 mg/dL — AB (ref 65–99)
GLUCOSE-CAPILLARY: 183 mg/dL — AB (ref 65–99)
GLUCOSE-CAPILLARY: 245 mg/dL — AB (ref 65–99)
Glucose-Capillary: 285 mg/dL — ABNORMAL HIGH (ref 65–99)

## 2016-06-12 LAB — MAGNESIUM
MAGNESIUM: 3.2 mg/dL — AB (ref 1.7–2.4)
Magnesium: 2.3 mg/dL (ref 1.7–2.4)

## 2016-06-12 LAB — LACTIC ACID, PLASMA
Lactic Acid, Venous: 1.1 mmol/L (ref 0.5–1.9)
Lactic Acid, Venous: 4.1 mmol/L (ref 0.5–1.9)

## 2016-06-12 LAB — FERRITIN: Ferritin: 373 ng/mL — ABNORMAL HIGH (ref 11–307)

## 2016-06-12 LAB — FOLATE: FOLATE: 11 ng/mL (ref >5.9)

## 2016-06-12 MED ORDER — MAGNESIUM SULFATE 4 GM/100ML IV SOLN
4.0000 g | Freq: Once | INTRAVENOUS | Status: AC
  Administered 2016-06-12: 4 g via INTRAVENOUS
  Filled 2016-06-12: qty 100

## 2016-06-12 MED ORDER — SODIUM CHLORIDE 0.9 % IV SOLN
2.0000 g | Freq: Once | INTRAVENOUS | Status: AC
  Administered 2016-06-12: 2 g via INTRAVENOUS
  Filled 2016-06-12: qty 20

## 2016-06-12 MED ORDER — ACETAMINOPHEN 650 MG RE SUPP: 650.0000 mg | Freq: Four times a day (QID) | RECTAL | Status: DC | PRN

## 2016-06-12 MED ORDER — POTASSIUM CHLORIDE CRYS ER 20 MEQ PO TBCR
40.0000 meq | EXTENDED_RELEASE_TABLET | Freq: Once | ORAL | Status: AC
  Administered 2016-06-12: 40 meq via ORAL
  Filled 2016-06-12: qty 2

## 2016-06-12 MED ORDER — ACETAMINOPHEN 325 MG PO TABS
650.0000 mg | ORAL_TABLET | Freq: Four times a day (QID) | ORAL | Status: DC | PRN
  Administered 2016-06-13 – 2016-06-23 (×6): 650 mg via ORAL
  Filled 2016-06-12 (×5): qty 2

## 2016-06-12 MED ORDER — SODIUM CHLORIDE 0.9 % IV BOLUS (SEPSIS)
500.0000 mL | Freq: Once | INTRAVENOUS | Status: AC
  Administered 2016-06-12: 500 mL via INTRAVENOUS

## 2016-06-12 MED ORDER — PIPERACILLIN-TAZOBACTAM IN DEX 2-0.25 GM/50ML IV SOLN
2.2500 g | Freq: Four times a day (QID) | INTRAVENOUS | Status: DC
  Administered 2016-06-12 – 2016-06-15 (×10): 2.25 g via INTRAVENOUS
  Filled 2016-06-12 (×11): qty 50

## 2016-06-12 MED ORDER — DEXTROSE 5 % IV SOLN: 2.0000 g | INTRAVENOUS | Status: DC

## 2016-06-12 MED ORDER — DEXTROSE 5 % IV SOLN
1.0000 g | Freq: Once | INTRAVENOUS | Status: AC
  Administered 2016-06-12: 1 g via INTRAVENOUS
  Filled 2016-06-12: qty 10

## 2016-06-12 MED ORDER — SODIUM CHLORIDE 0.9 % IV BOLUS (SEPSIS)
1000.0000 mL | Freq: Once | INTRAVENOUS | Status: AC
  Administered 2016-06-12: 1000 mL via INTRAVENOUS

## 2016-06-12 MED ORDER — POTASSIUM CHLORIDE CRYS ER 20 MEQ PO TBCR: 40.0000 meq | EXTENDED_RELEASE_TABLET | Freq: Two times a day (BID) | ORAL | Status: DC

## 2016-06-12 MED ORDER — K PHOS MONO-SOD PHOS DI & MONO 155-852-130 MG PO TABS
500.0000 mg | ORAL_TABLET | Freq: Once | ORAL | Status: AC
  Administered 2016-06-12: 500 mg via ORAL
  Filled 2016-06-12 (×2): qty 2

## 2016-06-12 NOTE — Progress Notes (Signed)
Pt. febrile with a temp. of 103.57F.  650 mg of Tylenol given. Per pt. her sister tested positive for flu. Patient has not had a flu shot.Pt. Droplet precautions initiated. Mid-level notified. Awaiting order for possible testing for flu.

## 2016-06-12 NOTE — Progress Notes (Signed)
CRITICAL VALUE ALERT  Critical value received:  Calcium  Date of notification:  06/12/16  Time of notification:  02:20  Critical value read back:yes  Nurse who received alert:  Rejeana Fadness  MD notified (1st page):  Mid-level  Time of first page:  02:22

## 2016-06-12 NOTE — Progress Notes (Addendum)
PHARMACY - PHYSICIAN COMMUNICATION CRITICAL VALUE ALERT - BLOOD CULTURE IDENTIFICATION (BCID)  Results for orders placed or performed during the hospital encounter of 06/11/16  Blood Culture ID Panel (Reflexed) (Collected: 06/12/2016  1:36 AM)  Result Value Ref Range   Enterococcus species NOT DETECTED NOT DETECTED   Listeria monocytogenes NOT DETECTED NOT DETECTED   Staphylococcus species NOT DETECTED NOT DETECTED   Staphylococcus aureus NOT DETECTED NOT DETECTED   Streptococcus species NOT DETECTED NOT DETECTED   Streptococcus agalactiae NOT DETECTED NOT DETECTED   Streptococcus pneumoniae NOT DETECTED NOT DETECTED   Streptococcus pyogenes NOT DETECTED NOT DETECTED   Acinetobacter baumannii NOT DETECTED NOT DETECTED   Enterobacteriaceae species DETECTED (A) NOT DETECTED   Enterobacter cloacae complex NOT DETECTED NOT DETECTED   Escherichia coli DETECTED (A) NOT DETECTED   Klebsiella oxytoca NOT DETECTED NOT DETECTED   Klebsiella pneumoniae NOT DETECTED NOT DETECTED   Proteus species NOT DETECTED NOT DETECTED   Serratia marcescens NOT DETECTED NOT DETECTED   Carbapenem resistance NOT DETECTED NOT DETECTED   Haemophilus influenzae NOT DETECTED NOT DETECTED   Neisseria meningitidis NOT DETECTED NOT DETECTED   Pseudomonas aeruginosa NOT DETECTED NOT DETECTED   Candida albicans NOT DETECTED NOT DETECTED   Candida glabrata NOT DETECTED NOT DETECTED   Candida krusei NOT DETECTED NOT DETECTED   Candida parapsilosis NOT DETECTED NOT DETECTED   Candida tropicalis NOT DETECTED NOT DETECTED    Name of physician (or Provider) Contacted: [place]  Changes to prescribed antibiotics required: Will adjust Rocephin to 2g IV q24 for bacteremia  Alvin Diffee A 06/12/2016  6:48 PM

## 2016-06-12 NOTE — Progress Notes (Signed)
CRITICAL VALUE ALERT  Critical value received:  Lactic acid 4.1  Date of notification:  06/12/16  Time of notification:  2114  Critical value read back:Yes.    Nurse who received alert:  Reynold Bowen, RN  MD notified (1st page):  Baltazar Najjar  Time of first page:  2133  MD notified (2nd page):  Time of second page:  Responding MD:  Baltazar Najjar  Time MD responded:  2140  New orders placed

## 2016-06-12 NOTE — Progress Notes (Signed)
Around 2000, Rn went into room and patient was shivering. Vitals were taken and BP was 161/63, rectal temp was 100.2, and HR was sustaining in the 140's. Tylenol was given and on call was made aware. New orders were given to give 1L bolus and get lab work. At 2115, lactic acid came back at 4.1, HR still sustaining in the 130's-140's. Temp was rechecked and it was 104 rectally. On call made aware and new orders were placed for another 1L bolus and to be transferred down to stepdown. Report was called and RN and tech brought patient and belongings to [place]. Patient did not want RN to call anybody to let them know she was being transferred.

## 2016-06-12 NOTE — Progress Notes (Signed)
PROGRESS NOTE    [name] Michael  VQM:[id number] DOB: 11/09/1960 DOA: 06/11/2016 PCP: Arnoldo Morale, MD   Brief Narrative:  55 y.o. female with medical history significant of diabetes mellitus, hyperlipidemia, hypertension, stroke. Patient is a poor historian but states that for several days she has been having diarrhea, nausea and vomiting, and weakness. Presented to the ED with UTI, hypokalemia, hypocalcemia, hypophosphatemia, hypomagnesemia, lactic acidosis.   Assessment & Plan:   Sepsis - From urinary source - obtain blood cultures - Continue current antibiotic regimen  UTI - f/u cultures - Continue current antibiotic regimen  Anemia - Obtain anemia panel  Diarrhea -CT scan reported possible colitis. I suspect patient most likely had a viral gastroenteritis -We'll continue supportive therapy - Should leukocytosis and/or abdominal discomfort/diarrhea get worse we'll plan on adding Cipro and Flagyl versus Zosyn  Active Problems:   Diabetes ([place]) - stable continue current regimen    Essential hypertension - Monitor off antihypertensive medications given suspected sepsis     GERD - Placed on tums for calcium replacement but may be used for gerd    Hypokalemia - replaced with improvement in level - will replace again and reassess next am  Hypomagnesemia - replaced IV overnight - reassess next am.    Hypocalcemia - improved after replacement. With adjustment in calcium levels when looking corrected after consideration of albumin levels, calcium is wnl.    Acute kidney injury ([place]) - Will continue to monitor suspected secondary to prerenal causes but after fluids, S creatinine remains elevated. As such suspecting another underlying disorder. Will continue to monitor S creatinine for now. Should S creatinine stay the same or get worse would consider Nephrology consult.  DVT prophylaxis: SCDs Code Status: DNR Family Communication:d/c patient Disposition Plan:  Transfer to telemetry floor today   Consultants:   None   Procedures: None   Antimicrobials: Rocephin   Subjective: Patient complaining of continued abdominal discomfort. This generalized. No acute issues overnight. Still feeling weak  Objective: Vitals:   06/12/16 0400 06/12/16 0548 06/12/16 0700 06/12/16 0746  BP: (!) 110/53 (!) 122/57 135/62 (!) 145/57  Pulse: 98 96 96 99  Resp: (!) 21 (!) 22 20 (!) 25  Temp:      TempSrc:      SpO2: 96% 94% 98% 98%  Weight:      Height:        Intake/Output Summary (Last 24 hours) at 06/12/16 0825 Last data filed at 06/12/16 0700  Gross per 24 hour  Intake          1442.92 ml  Output                0 ml  Net          1442.92 ml   Filed Weights   06/11/16 2119  Weight: 37 kg (81 lb 9.1 oz)    Examination:  General exam: Appears calm and comfortable  Respiratory system: Clear to auscultation. Respiratory effort normal. Cardiovascular system: S1 & S2 heard, RRR. No JVD, murmurs, rubs, gallops or clicks.  Gastrointestinal system: Diffuse tenderness on palpation no rebound tenderness, no guarding, soft, positive bowel sounds Central nervous system: Alert and oriented. No focal neurological deficits. Extremities: Symmetric 5 x 5 power. Skin: No rashes, lesions or ulcers, and limited exam Psychiatry:  Mood & affect appropriate.   Data Reviewed: I have personally reviewed following labs and imaging studies  CBC:  Recent Labs Lab 06/11/16 1425 06/12/16 0136 06/12/16 0744  WBC 27.3* 22.4* 24.1*  NEUTROABS 24.6*  --   --   HGB 7.6* 7.0* 7.8*  HCT 22.4* 20.4* 23.4*  MCV 78.6 78.8 81.0  PLT 566* 480* 127*   Basic Metabolic Panel:  Recent Labs Lab 06/11/16 1425 06/12/16 0136 06/12/16 0744  NA 131* 139 138  K 2.1* 2.9* 3.1*  CL 90* 107 104  CO2 25 22 23   GLUCOSE 279* 107* 118*  BUN 43* 37* 37*  CREATININE 2.49* 1.97* 2.28*  CALCIUM 6.6* 6.0* 7.3*  MG 1.1*  --   --   PHOS 2.3*  --   --    GFR: Estimated  Creatinine Clearance: 14.5 mL/min (by C-G formula based on SCr of 2.28 mg/dL (H)). Liver Function Tests:  Recent Labs Lab 06/11/16 1425 06/12/16 0136  AST 22 16  ALT 11* 9*  ALKPHOS 68 62  BILITOT 0.5 0.7  PROT 6.4* 5.1*  ALBUMIN 2.0* 1.7*   No results for input(s): LIPASE, AMYLASE in the last 168 hours. No results for input(s): AMMONIA in the last 168 hours. Coagulation Profile: No results for input(s): INR, PROTIME in the last 168 hours. Cardiac Enzymes:  Recent Labs Lab 06/11/16 1425  TROPONINI <0.03   BNP (last 3 results) No results for input(s): PROBNP in the last 8760 hours. HbA1C: No results for input(s): HGBA1C in the last 72 hours. CBG:  Recent Labs Lab 06/11/16 2141  GLUCAP 80   Lipid Profile: No results for input(s): CHOL, HDL, LDLCALC, TRIG, CHOLHDL, LDLDIRECT in the last 72 hours. Thyroid Function Tests:  Recent Labs  06/11/16 1425  TSH 1.033   Anemia Panel: No results for input(s): VITAMINB12, FOLATE, FERRITIN, TIBC, IRON, RETICCTPCT in the last 72 hours. Sepsis Labs:  Recent Labs Lab 06/11/16 1639 06/12/16 0744  LATICACIDVEN 3.84* 1.1    Recent Results (from the past 240 hour(s))  MRSA PCR Screening     Status: None   Collection Time: 06/11/16  8:49 PM  Result Value Ref Range Status   MRSA by PCR NEGATIVE NEGATIVE Final    Comment:        The GeneXpert MRSA Assay (FDA approved for NASAL specimens only), is one component of a comprehensive MRSA colonization surveillance program. It is not intended to diagnose MRSA infection nor to guide or monitor treatment for MRSA infections.          Radiology Studies: Ct Abdomen Pelvis Wo Contrast  Result Date: 06/11/2016 CLINICAL DATA:  Fall in the shower, week with diarrhea and nausea and vomiting EXAM: CT ABDOMEN AND PELVIS WITHOUT CONTRAST TECHNIQUE: Multidetector CT imaging of the abdomen and pelvis was performed following the standard protocol without IV contrast. COMPARISON:   None. FINDINGS: Lower chest: Lung bases demonstrate mild dependent atelectasis posteriorly. Minimal bronchiectasis posterior right lower lobe. No pleural effusion. Heart size nonenlarged. Hepatobiliary: No focal liver abnormality is seen. No gallstones, gallbladder wall thickening, or biliary dilatation. Pancreas: No pancreatic ductal dilatation or surrounding inflammatory changes. Few scattered calcifications at the head and uncinate process of the pancreas suggestive of chronic pancreatitis. Spleen: Normal in size without focal abnormality. Adrenals/Urinary Tract: Adrenal glands are within normal limits. Kidneys appear edematous and there is moderate perinephric fat stranding. There are multiple punctate nonobstructing stones within the bilateral kidneys. No ureteral stone. Bladder is normal. Stomach/Bowel: The stomach is nonenlarged. Multifocal areas of mild colon wall thickening throughout the colon. Majority of the contrast is in the colon. Fluid filled loops of nondilated small bowel with suggestion of possible wall thickening. Normal appendix. Vascular/Lymphatic: Aortic atherosclerosis.  No enlarged abdominal or pelvic lymph nodes. Reproductive: Uterus and bilateral adnexa are unremarkable. Other: No free air. Musculoskeletal: No acute or significant osseous findings. IMPRESSION: 1. Kidneys appear an edematous bilaterally and there is moderate perinephric fat stranding, findings are nonspecific but could be due to medical renal disease or possible pyelonephritis, clinical correlation recommended. Multiple punctate stones within the bilateral kidneys without ureteral stone identified. 2. Multifocal areas of mild wall thickening involving the colon. There are fluid-filled loops of small bowel with suggested areas of mild wall thickening, findings could relate to enteritis/colitis of infectious or inflammatory etiology. There is no evidence for a bowel obstruction. The appendix is normal. Electronically Signed    By: Donavan Foil M.D.   On: 06/11/2016 19:31        Scheduled Meds: . atorvastatin  20 mg Oral Daily  . calcium carbonate  1 tablet Oral BID WC  . cefTRIAXone (ROCEPHIN)  IV  1 g Intravenous Q24H  . insulin aspart  0-15 Units Subcutaneous TID WC  . insulin aspart  0-5 Units Subcutaneous QHS  . multivitamin with minerals  1 tablet Oral Daily  . phosphorus  500 mg Oral Once  . potassium chloride  40 mEq Oral Once  . sodium chloride flush  3 mL Intravenous Q12H   Continuous Infusions: . sodium chloride 125 mL/hr at 06/12/16 0103     LOS: 1 day    Time spent: > 35 minutes  Velvet Bathe, MD [facility] Pager [phone]  If 7PM-7AM, please contact night-coverage [email or web address] Password [email or web address] 06/12/2016, 8:25 AM

## 2016-06-13 DIAGNOSIS — A4151 Sepsis due to Escherichia coli [E. coli]: Principal | ICD-10-CM

## 2016-06-13 DIAGNOSIS — E0800 Diabetes mellitus due to underlying condition with hyperosmolarity without nonketotic hyperglycemic-hyperosmolar coma (NKHHC): Secondary | ICD-10-CM

## 2016-06-13 LAB — CBC WITH DIFFERENTIAL/PLATELET
BASOS ABS: 0 10*3/uL (ref 0.0–0.1)
BASOS PCT: 0 %
Eosinophils Absolute: 0 10*3/uL (ref 0.0–0.7)
Eosinophils Relative: 0 %
HEMATOCRIT: 19.9 % — AB (ref 36.0–46.0)
Hemoglobin: 6.6 g/dL — CL (ref 12.0–15.0)
LYMPHS PCT: 3 %
Lymphs Abs: 0.6 10*3/uL — ABNORMAL LOW (ref 0.7–4.0)
MCH: 26.4 pg (ref 26.0–34.0)
MCHC: 33.2 g/dL (ref 30.0–36.0)
MCV: 79.6 fL (ref 78.0–100.0)
Monocytes Absolute: 1.1 10*3/uL — ABNORMAL HIGH (ref 0.1–1.0)
Monocytes Relative: 5 %
NEUTROS ABS: 21.3 10*3/uL — AB (ref 1.7–7.7)
NEUTROS PCT: 92 %
Platelets: 498 10*3/uL — ABNORMAL HIGH (ref 150–400)
RBC: 2.5 MIL/uL — AB (ref 3.87–5.11)
RDW: 14.9 % (ref 11.5–15.5)
WBC: 23 10*3/uL — AB (ref 4.0–10.5)

## 2016-06-13 LAB — GLUCOSE, CAPILLARY
GLUCOSE-CAPILLARY: 241 mg/dL — AB (ref 65–99)
Glucose-Capillary: 279 mg/dL — ABNORMAL HIGH (ref 65–99)
Glucose-Capillary: 253 mg/dL — ABNORMAL HIGH (ref 65–99)
Glucose-Capillary: 329 mg/dL — ABNORMAL HIGH (ref 65–99)

## 2016-06-13 LAB — MAGNESIUM: Magnesium: 1.8 mg/dL (ref 1.7–2.4)

## 2016-06-13 LAB — BASIC METABOLIC PANEL
ANION GAP: 12 (ref 5–15)
BUN: 34 mg/dL — ABNORMAL HIGH (ref 6–20)
CO2: 17 mmol/L — AB (ref 22–32)
Calcium: 6.2 mg/dL — CL (ref 8.9–10.3)
Chloride: 108 mmol/L (ref 101–111)
Creatinine, Ser: 2.33 mg/dL — ABNORMAL HIGH (ref 0.44–1.00)
GFR, EST AFRICAN AMERICAN: 26 mL/min — AB (ref >60)
GFR, EST NON AFRICAN AMERICAN: 22 mL/min — AB (ref >60)
GLUCOSE: 251 mg/dL — AB (ref 65–99)
POTASSIUM: 3.5 mmol/L (ref 3.5–5.1)
Sodium: 137 mmol/L (ref 135–145)

## 2016-06-13 LAB — PREPARE RBC (CROSSMATCH)

## 2016-06-13 LAB — LACTIC ACID, PLASMA
LACTIC ACID, VENOUS: 1.9 mmol/L (ref 0.5–1.9)
Lactic Acid, Venous: 1.5 mmol/L (ref 0.5–1.9)

## 2016-06-13 LAB — HEMOGLOBIN AND HEMATOCRIT, BLOOD
HEMATOCRIT: 21.2 % — AB (ref 36.0–46.0)
HEMOGLOBIN: 7.1 g/dL — AB (ref 12.0–15.0)

## 2016-06-13 MED ORDER — SODIUM CHLORIDE 0.9 % IV SOLN
Freq: Once | INTRAVENOUS | Status: AC
  Administered 2016-06-13: 09:00:00 via INTRAVENOUS

## 2016-06-13 MED ORDER — METOPROLOL TARTRATE 5 MG/5ML IV SOLN
10.0000 mg | Freq: Once | INTRAVENOUS | Status: AC
Start: 2016-06-13 — End: 2016-06-13
  Administered 2016-06-13: 10 mg via INTRAVENOUS

## 2016-06-13 MED ORDER — METOPROLOL TARTRATE 5 MG/5ML IV SOLN
INTRAVENOUS | Status: AC
  Administered 2016-06-13: 10 mg via INTRAVENOUS
  Filled 2016-06-13: qty 10

## 2016-06-13 MED ORDER — SODIUM CHLORIDE 0.9 % IV BOLUS (SEPSIS): 500.0000 mL | Freq: Once | INTRAVENOUS | Status: DC

## 2016-06-13 MED ORDER — METOPROLOL TARTRATE 25 MG PO TABS
25.0000 mg | ORAL_TABLET | Freq: Two times a day (BID) | ORAL | Status: DC
  Administered 2016-06-13 – 2016-06-27 (×28): 25 mg via ORAL
  Filled 2016-06-13 (×29): qty 1

## 2016-06-13 MED ORDER — LISINOPRIL 2.5 MG PO TABS
5.0000 mg | ORAL_TABLET | Freq: Every day | ORAL | Status: DC
  Administered 2016-06-14: 5 mg via ORAL
  Filled 2016-06-13: qty 2

## 2016-06-13 NOTE — Progress Notes (Signed)
Pharmacy Antibiotic Note  [name] Michael is a 55 y.o. female admitted on 06/11/2016 with Intra-abdominal infection.  Pharmacy has been consulted for zosyn dosing.  Plan: Zosyn 2.25gm iv q6hr  Height: 4' 6.5" (138.4 cm) Weight: 97 lb 7.1 oz (44.2 kg) IBW/kg (Calculated) : 32.85  Temp (24hrs), Avg:100.7 F (38.2 C), Min:97.6 F (36.4 C), Max:104 F (40 C)   Recent Labs Lab 06/11/16 1425 06/11/16 1639 06/12/16 0136 06/12/16 0744 06/12/16 2038  WBC 27.3*  --  22.4* 24.1* 24.3*  CREATININE 2.49*  --  1.97* 2.28* 2.53*  LATICACIDVEN  --  3.84*  --  1.1 4.1*    Estimated Creatinine Clearance: 14.8 mL/min (by C-G formula based on SCr of 2.53 mg/dL (H)).    Allergies  Allergen Reactions  . Hydrocodone Nausea And Vomiting    Antimicrobials this admission: Zosyn 12/21 >> Ceftriaxone 12/20 >> 12/21  Dose adjustments this admission: -  Microbiology results: pending  Thank you for allowing pharmacy to be a part of this patient's care.  Nani Skillern Crowford 06/13/2016 12:11 AM

## 2016-06-13 NOTE — Progress Notes (Signed)
CRITICAL VALUE ALERT  Critical value received: Hgb 6.6   Date of notification:  06/13/2016  Time of notification:  0845  Critical value read back:Yes.     Nurse who received alert:  Tessie Eke  MD notified (1st page):  Adair Patter, A  Time of first page: [phone]

## 2016-06-13 NOTE — Progress Notes (Addendum)
Shift events: RN paged earlier in shift because pt was febrile at 100.76F, rectally,  and shivering. HR 150s, RR 30s. NP ordered IVF bolus, Tylenol and asked RN to call back if not better. Labs ordered. RN called back stating pt's Temp was 104F now, HR still in 130s (ST) and LA 4.1. Another bolus ordered and IVF rate increased. TF orders to SDU placed. Ice to axilla. Cooling blanket in SDU.  CBC with stable WBCC. BMP with slightly bumped creat and normal K and Mg (were abnormal earlier 12/21).  Upon TF to SDU, RN paged that pt's abd was more distended than yesterday (RN had pt yesterday). NP to bedside. S: pt says her abd pain is about the same as yesterday. No worse. No diarrhea. No vomiting. O: Frail appearing petite WF in NAD. Pale. Skin warm, dry. Alert and oriented. Card: S1S2, sinus tach on tele, rate lower. Resp: even, unlabored. Abd: distended. BS +. Soft, generalized tenderness on palpation but no rebound or guarding.  A/P:         1. Sepsis with known UTI-culture with Ecoli. Change Abx to                     Zosyn to cover #2 as well. Increased IVF and IVF boluses.                 Continue to trend LA and r/p boluses as needed.          2. Sepsis with colitis on CT-Pt had no diarrhea since admit.                  However, given spike in fever tonight, will cover with Zosyn                 as above. KUB shows air in colon, no free air or obs.          3. AKI-continue IVFs, follow labs.           4. Fever-as above, and has already had urine and blood                      cultures with final results pending. Zosyn. IVF. After ice and                IVF, temp trending down. CXR showed atelectasis. IS                         ordered.  Will follow.  KJKG, NP [facility] Update at 6am. Pt had episode of SVT with high BP. Metoprolol 10mg  IV given with success. Tylenol for fevers. LA back to normal. IVF decreased due to slight crackles.  KJKG, NP [facility]

## 2016-06-13 NOTE — Progress Notes (Addendum)
Inpatient Diabetes Program Recommendations  AACE/ADA: New Consensus Statement on Inpatient Glycemic Control (2015)  Target Ranges:  Prepandial:   less than 140 mg/dL      Peak postprandial:   less than 180 mg/dL (1-2 hours)      Critically ill patients:  140 - 180 mg/dL   Results for [name] Michael, [name] Michael (MRN [id number]) as of 06/13/2016 07:19  Ref. Range 06/12/2016 08:17 06/12/2016 11:45 06/12/2016 16:51 06/12/2016 21:51  Glucose-Capillary Latest Ref Range: 65 - 99 mg/dL 104 (H) 183 (H) 245 (H) 285 (H)    Admit with: UTI/ Sepsis  History: DM  Home DM Meds: Metformin 1000 mg BID       Glipizide 5 mg BID  Current Insulin Orders: Novolog Moderate Correction Scale/ SSI (0-15 units) TID AC + HS       -Patient established at the [facility] and [employer].  Last saw Dr. Jarold Song on 04/29/16.  At that visit, Glipizide was increased to 5 mg BID (was previously taking 2.5 mg BID).  -Postprandial glucose levels elevated yesterday.  Eating 50-100% of meals.     MD- Please consider adding Novolog Meal Coverage to inpatient regimen while home oral DM meds are on hold:  Novolog 3 units TID with meals (hold if pt eats <50% of meal)      --Will follow patient during hospitalization--  Wyn Quaker RN, MSN, CDE Diabetes Coordinator Inpatient Glycemic Control Team Team Pager: [phone] (8a-5p)

## 2016-06-13 NOTE — Progress Notes (Signed)
Pt's HR back in SVT 140's, received orders for 10mg  lopressor. HR now down to 110's. BP now 137/53 from 181/126. Pt shivering and c/o chills, fever up to 102.7. Gave 650mg  tylenol PO. Will continue to monitor.

## 2016-06-13 NOTE — Progress Notes (Signed)
PROGRESS NOTE    [name] Michael  ZTI:[id number] DOB: 11-03-1960 DOA: 06/11/2016 PCP: Arnoldo Morale, MD    Brief Narrative:  55 y.o.femalewith medical history significant of diabetes mellitus, hyperlipidemia, hypertension, stroke. Patient is a poor historian but states that for several days she has been having diarrhea, nausea and vomiting, and weakness. Presented to the ED with UTI, hypokalemia, hypocalcemia, hypophosphatemia, hypomagnesemia, lactic acidosis.   Assessment & Plan:   Active Problems:   Diabetes ([place])   Essential hypertension   GERD   Hypokalemia   Hypocalcemia   Acute kidney injury ([place])   Hypophosphatemia   Sepsis - From urinary source - basctermic with E. coli - continue zosyn at this time  Acute kidney injury ([place]) - Will continue to monitor suspected secondary to prerenal causes but after fluids, S creatinine remains elevated - repeat BMP pending from this am - patient urinating and denies history of kidney disease   - may need to consider nephrology consultation  UTI - f/u cultures and sensitivities - Switched to zosyn overnight - continue zosyn  Anemia - H/H lower this am - will tranfuse 2 units - low iron, low TIBC, MCV <80 - could be anemia of chronic disease or anemia due to infection  Diarrhea -CT scan reported possible colitis. I suspect patient most likely had a viral gastroenteritis -We'll continue supportive therapy - zosyn should cover intraabdominal etiology if bacterial infection possible    Diabetes ([place]) - CBG's elevated - continue SSI and order novolog 3 units w/ meals if patient eats >50% of meals    Essential hypertension - Monitor off antihypertensive medications given sepsis     GERD - Placed on tums for calcium replacement but may be used for gerd    Hypokalemia - replaced with improvement in level - BMP today pending  Hypomagnesemia - replaced IV overnight - repeat lab pending    Hypocalcemia -  improved after replacement    DVT prophylaxis: SCDs Code Status: DNR Family Communication:d/c patient Disposition Plan: guarded at this time- patient critically ill   Consultants:   none  Procedures:   None  Antimicrobials:   Rocephin 12/20>12/22  Zosyn 12/22>    Subjective: Patient sitting up in chair eating breakfast.  States she is starting to feel slightly better than previously. H/H from CBC drawn at 0341 was 6.6.  Transfusion ordered.  Patient denies chills, but states she still has occasional pain.  Denies shortness of breath.  No dysuria.   Objective: Vitals:   06/13/16 0600 06/13/16 0615 06/13/16 0630 06/13/16 0747  BP: (!) 122/49 (!) 124/50 (!) 119/54   Pulse: 97 96 97   Resp: (!) 23 (!) 22 (!) 21   Temp:    98.3 F (36.8 C)  TempSrc:    Oral  SpO2: 97% 97% 97%   Weight:      Height:        Intake/Output Summary (Last 24 hours) at 06/13/16 0850 Last data filed at 06/13/16 0600  Gross per 24 hour  Intake             3725 ml  Output                0 ml  Net             3725 ml   Filed Weights   06/11/16 2119 06/12/16 2300  Weight: 37 kg (81 lb 9.1 oz) 44.2 kg (97 lb 7.1 oz)    Examination:  General exam: Appears  calm and comfortable  Respiratory system: Clear to auscultation. Respiratory effort normal. Cardiovascular system: S1 & S2 heard, RRR. No JVD, murmurs, rubs, gallops or clicks. No pedal edema. Gastrointestinal system: Abdomen is nondistended, soft and nontender. No organomegaly or masses felt. Normal bowel sounds heard. Posterior bilateral CVA pain Central nervous system: Alert and oriented. No focal neurological deficits. Extremities: Symmetric 5 x 5 power. Skin: No rashes, lesions or ulcers Psychiatry: Judgement and insight appear normal. Mood & affect appropriate.     Data Reviewed: I have personally reviewed following labs and imaging studies  CBC:  Recent Labs Lab 06/11/16 1425 06/12/16 0136 06/12/16 0744 06/12/16 2038  06/13/16 0348  WBC 27.3* 22.4* 24.1* 24.3* 23.0*  NEUTROABS 24.6*  --   --  22.6* 21.3*  HGB 7.6* 7.0* 7.8* 7.3* 6.6*  HCT 22.4* 20.4* 23.4* 22.0* 19.9*  MCV 78.6 78.8 81.0 78.6 79.6  PLT 566* 480* 568* 624* 431*   Basic Metabolic Panel:  Recent Labs Lab 06/11/16 1425 06/12/16 0136 06/12/16 0744 06/12/16 2038  NA 131* 139 138 139  K 2.1* 2.9* 3.1* 3.9  CL 90* 107 104 104  CO2 25 22 23  21*  GLUCOSE 279* 107* 118* 301*  BUN 43* 37* 37* 37*  CREATININE 2.49* 1.97* 2.28* 2.53*  CALCIUM 6.6* 6.0* 7.3* 7.0*  MG 1.1*  --  3.2* 2.3  PHOS 2.3*  --   --   --    GFR: Estimated Creatinine Clearance: 14.8 mL/min (by C-G formula based on SCr of 2.53 mg/dL (H)). Liver Function Tests:  Recent Labs Lab 06/11/16 1425 06/12/16 0136  AST 22 16  ALT 11* 9*  ALKPHOS 68 62  BILITOT 0.5 0.7  PROT 6.4* 5.1*  ALBUMIN 2.0* 1.7*   No results for input(s): LIPASE, AMYLASE in the last 168 hours. No results for input(s): AMMONIA in the last 168 hours. Coagulation Profile: No results for input(s): INR, PROTIME in the last 168 hours. Cardiac Enzymes:  Recent Labs Lab 06/11/16 1425  TROPONINI <0.03   BNP (last 3 results) No results for input(s): PROBNP in the last 8760 hours. HbA1C: No results for input(s): HGBA1C in the last 72 hours. CBG:  Recent Labs Lab 06/12/16 0817 06/12/16 1145 06/12/16 1651 06/12/16 2151 06/13/16 0728  GLUCAP 104* 183* 245* 285* 279*   Lipid Profile: No results for input(s): CHOL, HDL, LDLCALC, TRIG, CHOLHDL, LDLDIRECT in the last 72 hours. Thyroid Function Tests:  Recent Labs  06/11/16 1425  TSH 1.033   Anemia Panel:  Recent Labs  06/12/16 0905  VITAMINB12 192  FOLATE 11.0  FERRITIN 373*  TIBC 174*  IRON 6*  RETICCTPCT 1.1   Sepsis Labs:  Recent Labs Lab 06/12/16 0744 06/12/16 2038 06/13/16 0054 06/13/16 0348  LATICACIDVEN 1.1 4.1* 1.9 1.5    Recent Results (from the past 240 hour(s))  Urine culture     Status: Abnormal  (Preliminary result)   Collection Time: 06/11/16  2:33 PM  Result Value Ref Range Status   Specimen Description URINE, CLEAN CATCH  Final   Special Requests NONE  Final   Culture >=100,000 COLONIES/mL GRAM NEGATIVE RODS (A)  Final   Report Status PENDING  Incomplete  MRSA PCR Screening     Status: None   Collection Time: 06/11/16  8:49 PM  Result Value Ref Range Status   MRSA by PCR NEGATIVE NEGATIVE Final    Comment:        The GeneXpert MRSA Assay (FDA approved for NASAL specimens only), is one  component of a comprehensive MRSA colonization surveillance program. It is not intended to diagnose MRSA infection nor to guide or monitor treatment for MRSA infections.   Culture, blood (routine x 2)     Status: None (Preliminary result)   Collection Time: 06/12/16  1:36 AM  Result Value Ref Range Status   Specimen Description BLOOD RIGHT ANTECUBITAL  Final   Special Requests IN PEDIATRIC BOTTLE 3ML  Final   Culture  Setup Time   Final    GRAM NEGATIVE RODS AEROBIC BOTTLE ONLY Organism ID to follow CRITICAL RESULT CALLED TO, READ BACK BY AND VERIFIED WITH: D [facility] PHARMD 1840 06/12/16 A BROWNING    Culture   Final    NO GROWTH < 12 HOURS Performed at [facility]    Report Status PENDING  Incomplete  Culture, blood (routine x 2)     Status: None (Preliminary result)   Collection Time: 06/12/16  1:36 AM  Result Value Ref Range Status   Specimen Description BLOOD RIGHT HAND  Final   Special Requests IN PEDIATRIC BOTTLE 2ML  Final   Culture  Setup Time   Final    GRAM NEGATIVE RODS AEROBIC BOTTLE ONLY CRITICAL RESULT CALLED TO, READ BACK BY AND VERIFIED WITHKeturah Barre [facility] PHARMD 1194 06/12/16 A BROWNING    Culture   Final    NO GROWTH < 12 HOURS Performed at [facility]    Report Status PENDING  Incomplete  Blood Culture ID Panel (Reflexed)     Status: Abnormal   Collection Time: 06/12/16  1:36 AM  Result Value Ref Range Status   Enterococcus species NOT  DETECTED NOT DETECTED Final   Listeria monocytogenes NOT DETECTED NOT DETECTED Final   Staphylococcus species NOT DETECTED NOT DETECTED Final   Staphylococcus aureus NOT DETECTED NOT DETECTED Final   Streptococcus species NOT DETECTED NOT DETECTED Final   Streptococcus agalactiae NOT DETECTED NOT DETECTED Final   Streptococcus pneumoniae NOT DETECTED NOT DETECTED Final   Streptococcus pyogenes NOT DETECTED NOT DETECTED Final   Acinetobacter baumannii NOT DETECTED NOT DETECTED Final   Enterobacteriaceae species DETECTED (A) NOT DETECTED Final    Comment: CRITICAL RESULT CALLED TO, READ BACK BY AND VERIFIED WITH: D [place] PHARMD 1840 06/12/16 A BROWNING    Enterobacter cloacae complex NOT DETECTED NOT DETECTED Final   Escherichia coli DETECTED (A) NOT DETECTED Final    Comment: CRITICAL RESULT CALLED TO, READ BACK BY AND VERIFIED WITH: D [facility] PHARMD 1840 06/12/16 A BROWNING    Klebsiella oxytoca NOT DETECTED NOT DETECTED Final   Klebsiella pneumoniae NOT DETECTED NOT DETECTED Final   Proteus species NOT DETECTED NOT DETECTED Final   Serratia marcescens NOT DETECTED NOT DETECTED Final   Carbapenem resistance NOT DETECTED NOT DETECTED Final   Haemophilus influenzae NOT DETECTED NOT DETECTED Final   Neisseria meningitidis NOT DETECTED NOT DETECTED Final   Pseudomonas aeruginosa NOT DETECTED NOT DETECTED Final   Candida albicans NOT DETECTED NOT DETECTED Final   Candida glabrata NOT DETECTED NOT DETECTED Final   Candida krusei NOT DETECTED NOT DETECTED Final   Candida parapsilosis NOT DETECTED NOT DETECTED Final   Candida tropicalis NOT DETECTED NOT DETECTED Final    Comment: Performed at [facility]         Radiology Studies: Ct Abdomen Pelvis Wo Contrast  Result Date: 06/11/2016 CLINICAL DATA:  Fall in the shower, week with diarrhea and nausea and vomiting EXAM: CT ABDOMEN AND PELVIS WITHOUT CONTRAST TECHNIQUE: Multidetector CT imaging  of the abdomen and pelvis was  performed following the standard protocol without IV contrast. COMPARISON:  None. FINDINGS: Lower chest: Lung bases demonstrate mild dependent atelectasis posteriorly. Minimal bronchiectasis posterior right lower lobe. No pleural effusion. Heart size nonenlarged. Hepatobiliary: No focal liver abnormality is seen. No gallstones, gallbladder wall thickening, or biliary dilatation. Pancreas: No pancreatic ductal dilatation or surrounding inflammatory changes. Few scattered calcifications at the head and uncinate process of the pancreas suggestive of chronic pancreatitis. Spleen: Normal in size without focal abnormality. Adrenals/Urinary Tract: Adrenal glands are within normal limits. Kidneys appear edematous and there is moderate perinephric fat stranding. There are multiple punctate nonobstructing stones within the bilateral kidneys. No ureteral stone. Bladder is normal. Stomach/Bowel: The stomach is nonenlarged. Multifocal areas of mild colon wall thickening throughout the colon. Majority of the contrast is in the colon. Fluid filled loops of nondilated small bowel with suggestion of possible wall thickening. Normal appendix. Vascular/Lymphatic: Aortic atherosclerosis. No enlarged abdominal or pelvic lymph nodes. Reproductive: Uterus and bilateral adnexa are unremarkable. Other: No free air. Musculoskeletal: No acute or significant osseous findings. IMPRESSION: 1. Kidneys appear an edematous bilaterally and there is moderate perinephric fat stranding, findings are nonspecific but could be due to medical renal disease or possible pyelonephritis, clinical correlation recommended. Multiple punctate stones within the bilateral kidneys without ureteral stone identified. 2. Multifocal areas of mild wall thickening involving the colon. There are fluid-filled loops of small bowel with suggested areas of mild wall thickening, findings could relate to enteritis/colitis of infectious or inflammatory etiology. There is no  evidence for a bowel obstruction. The appendix is normal. Electronically Signed   By: Donavan Foil M.D.   On: 06/11/2016 19:31   Dg Chest Port 1 View  Result Date: 06/12/2016 CLINICAL DATA:  Fever and chills sepsis EXAM: PORTABLE CHEST 1 VIEW COMPARISON:  01/28/2016 FINDINGS: Slightly elevated right diaphragm. No acute consolidation or pleural effusion. Minimal linear peripheral opacities on the right could relate to linear atelectasis. Heart size upper normal. Pulmonary vascularity within normal limits. No pneumothorax. IMPRESSION: 1. No acute consolidation or effusion 2. Minimal linear peripheral opacities on the right could relate to atelectasis. Electronically Signed   By: Donavan Foil M.D.   On: 06/12/2016 22:10   Dg Abd Portable 1v  Result Date: 06/12/2016 CLINICAL DATA:  55 year old female with abdominal pain, colitis, and abdominal distention. EXAM: PORTABLE ABDOMEN - 1 VIEW COMPARISON:  Abdominal CT dated 06/11/2016 FINDINGS: Air is noted within the stomach and throughout the colon. Oral contrast from recent CT is seen in the distal colon and rectosigmoid. There is no bowel dilatation or evidence of obstruction. No free air or radiopaque calculi noted. The osseous structures and the soft tissues are grossly unremarkable. Right lung base linear densities likely atelectasis/scarring. IMPRESSION: No bowel obstruction or free air. Electronically Signed   By: Anner Crete M.D.   On: 06/12/2016 23:35        Scheduled Meds: . sodium chloride   Intravenous Once  . atorvastatin  20 mg Oral Daily  . calcium carbonate  1 tablet Oral BID WC  . insulin aspart  0-15 Units Subcutaneous TID WC  . insulin aspart  0-5 Units Subcutaneous QHS  . multivitamin with minerals  1 tablet Oral Daily  . piperacillin-tazobactam (ZOSYN)  IV  2.25 g Intravenous Q6H  . sodium chloride flush  3 mL Intravenous Q12H   Continuous Infusions: . sodium chloride 75 mL/hr at 06/13/16 0633     LOS: 2 days  Time spent: 35 minutes    Loretha Stapler, MD [facility] Pager [phone]  If 7PM-7AM, please contact night-coverage [email or web address] Password [facility] 06/13/2016, 8:50 AM

## 2016-06-14 ENCOUNTER — Inpatient Hospital Stay (HOSPITAL_COMMUNITY): Payer: Medicaid Other

## 2016-06-14 DIAGNOSIS — K219 Gastro-esophageal reflux disease without esophagitis: Secondary | ICD-10-CM

## 2016-06-14 LAB — BASIC METABOLIC PANEL
ANION GAP: 11 (ref 5–15)
BUN: 42 mg/dL — ABNORMAL HIGH (ref 6–20)
CALCIUM: 6.2 mg/dL — AB (ref 8.9–10.3)
CO2: 18 mmol/L — ABNORMAL LOW (ref 22–32)
Chloride: 112 mmol/L — ABNORMAL HIGH (ref 101–111)
Creatinine, Ser: 2.52 mg/dL — ABNORMAL HIGH (ref 0.44–1.00)
GFR, EST AFRICAN AMERICAN: 24 mL/min — AB (ref >60)
GFR, EST NON AFRICAN AMERICAN: 20 mL/min — AB (ref >60)
Glucose, Bld: 254 mg/dL — ABNORMAL HIGH (ref 65–99)
Potassium: 3.6 mmol/L (ref 3.5–5.1)
SODIUM: 141 mmol/L (ref 135–145)

## 2016-06-14 LAB — IRON AND TIBC
Iron: 7 ug/dL — ABNORMAL LOW (ref 28–170)
SATURATION RATIOS: 4 % — AB (ref 10.4–31.8)
TIBC: 175 ug/dL — ABNORMAL LOW (ref 250–450)
UIBC: 168 ug/dL

## 2016-06-14 LAB — GLUCOSE, CAPILLARY
GLUCOSE-CAPILLARY: 252 mg/dL — AB (ref 65–99)
GLUCOSE-CAPILLARY: 231 mg/dL — AB (ref 65–99)
GLUCOSE-CAPILLARY: 241 mg/dL — AB (ref 65–99)
Glucose-Capillary: 241 mg/dL — ABNORMAL HIGH (ref 65–99)
Glucose-Capillary: 138 mg/dL — ABNORMAL HIGH (ref 65–99)

## 2016-06-14 LAB — CULTURE, BLOOD (ROUTINE X 2)

## 2016-06-14 LAB — URINE CULTURE

## 2016-06-14 LAB — FERRITIN: Ferritin: 423 ng/mL — ABNORMAL HIGH (ref 11–307)

## 2016-06-14 MED ORDER — FUROSEMIDE 10 MG/ML IJ SOLN
120.0000 mg | Freq: Three times a day (TID) | INTRAVENOUS | Status: DC
  Administered 2016-06-14 – 2016-06-15 (×3): 120 mg via INTRAVENOUS
  Filled 2016-06-14 (×3): qty 12
  Filled 2016-06-14: qty 10

## 2016-06-14 MED ORDER — INSULIN ASPART 100 UNIT/ML ~~LOC~~ SOLN
3.0000 [IU] | Freq: Three times a day (TID) | SUBCUTANEOUS | Status: DC
Start: 2016-06-14 — End: 2016-06-18
  Administered 2016-06-14 – 2016-06-18 (×13): 3 [IU] via SUBCUTANEOUS

## 2016-06-14 MED ORDER — MAGNESIUM CHLORIDE 64 MG PO TBEC
1.0000 | DELAYED_RELEASE_TABLET | Freq: Every day | ORAL | Status: DC
  Administered 2016-06-14 – 2016-06-16 (×3): 64 mg via ORAL
  Filled 2016-06-14 (×4): qty 1

## 2016-06-14 MED ORDER — PIPERACILLIN-TAZOBACTAM 3.375 G IVPB
INTRAVENOUS | Status: AC
  Filled 2016-06-14: qty 50

## 2016-06-14 NOTE — Progress Notes (Signed)
CROSS COVER NOTE 06/14/16 0551  S: Nurse called with a panic value for calcium at 6.2. Was the same value previous day.   A/P: 1. Normal calcium. Albumin is low at 1.7.  Corrected calcium using  Ca corrected = Ca measured + 0.8(Alb 4.4 - Alb measured) = 8.4 Discussed the correction with nurse.

## 2016-06-14 NOTE — Consult Note (Signed)
[name] Michael is a 55 y.o. female with medical history significant of diabetes mellitus, hyperlipidemia, hypertension, stroke. Patient  states that for several days she has been having diarrhea, nausea and vomiting, and weakness. The weakness has been progressively getting worse. She presented to the hospital after falling in the shower. BP was low.  On 01/28/16 creat was 0.43 and Hgb 13.8. On admission creat was 2.14m/dl and Hbg 7.6g.  She received PRBCs yesterday.  Creat is 2.52 today and she is incontinent and has decreased UOP. FOB was neg.  CT abdomen showed edematous kidneys bilat.  Albumin was 1.7. UA had clumps of WBCs and 338mdl protein.  Urine and blood cultures positive for Ecoli, blood also pos for enterobacter Past Medical History:  Diagnosis Date  . Diabetes mellitus   . Hyperlipidemia   . Hypertension   [facility]   left frontal subcortical, saw Dr. SeLeonie Man . TIA (transient ischemic attack) 03-12-11   Past Surgical History:  Procedure Laterality Date  . OPEN REDUCTION INTERNAL FIXATION (ORIF) DISTAL RADIAL FRACTURE Left 01/28/2016   Procedure: OPEN REDUCTION INTERNAL FIXATION (ORIF) DISTAL RADIAL FRACTURE;  Surgeon: FrIran PlanasMD;  [place] [facility];  [place]ality: [place];  [place]   age 55 Social History:  reports that she has been smoking Cigarettes.  She has a 32.00 pack-year smoking history. She has never used smokeless tobacco. She reports that she does not drink alcohol or use drugs. Allergies:  Allergies  Allergen Reactions  . Hydrocodone Nausea And Vomiting   Family History  Problem Relation Age of Onset  . Stroke Mother   . Diabetes Mother   . Kidney failure Mother   . Heart failure Mother   . Stroke Father   . Cancer Sister     Breast- 50's    Medications:  Prior to Admission:  Prescriptions Prior to Admission  Medication Sig Dispense Refill Last  Dose  . aspirin 81 MG tablet Take 1 tablet (81 mg total) by mouth daily. 30 tablet 12 06/10/2016 at Unknown time  . atorvastatin (LIPITOR) 20 MG tablet Take 1 tablet (20 mg total) by mouth daily. 30 tablet 3 06/11/2016 at Unknown time  . glipiZIDE (GLUCOTROL) 5 MG tablet Take 1 tablet (5 mg total) by mouth 2 (two) times daily before a meal. 30 tablet 3 06/11/2016 at Unknown time  . ibuprofen (ADVIL,MOTRIN) 200 MG tablet Take 200 mg by mouth every 6 (six) hours as needed for moderate pain.   06/11/2016 at Unknown time  . lisinopril (PRINIVIL,ZESTRIL) 5 MG tablet Take 1 tablet (5 mg total) by mouth daily. 30 tablet 3 06/11/2016 at Unknown time  . metFORMIN (GLUCOPHAGE XR) 500 MG 24 hr tablet Take 2 tablets (1,000 mg total) by mouth 2 (two) times daily with a meal. 120 tablet 3 06/11/2016 at Unknown time  . metoprolol tartrate (LOPRESSOR) 25 MG tablet Take 1 tablet (25 mg total) by mouth 2 (two) times daily. 60 tablet 3 06/11/2016 at 0900  . Multiple Vitamin (MULTIVITAMIN) tablet Take 1 tablet by mouth daily.     06/11/2016 at Unknown time  . Blood Glucose Monitoring Suppl (TRUE METRIX METER) w/Device KIT Use as directed 1 kit 0 Taking  . glucose blood (TRUE METRIX BLOOD GLUCOSE TEST) test strip Use as instructed 100 each 12 Taking  . Insulin Pen Needle 31G X 5 MM MISC Use  as directed 100 each 2 Taking  . TRUEPLUS LANCETS 28G MISC Use as directed 100 each 5 Taking   Scheduled: . piperacillin-tazobactam      . atorvastatin  20 mg Oral Daily  . calcium carbonate  1 tablet Oral BID WC  . furosemide  120 mg Intravenous Q8H  . insulin aspart  0-15 Units Subcutaneous TID WC  . insulin aspart  0-5 Units Subcutaneous QHS  . insulin aspart  3 Units Subcutaneous TID WC  . lisinopril  5 mg Oral Daily  . magnesium chloride  1 tablet Oral Daily  . metoprolol tartrate  25 mg Oral BID  . multivitamin with minerals  1 tablet Oral Daily  . piperacillin-tazobactam (ZOSYN)  IV  2.25 g Intravenous Q6H  . sodium  chloride flush  3 mL Intravenous Q12H    ROS: as per  HPI Blood pressure (!) 141/87, pulse 98, temperature 98.2 F (36.8 C), temperature source Oral, resp. rate 20, height 4' 6.5" (1.384 m), weight 44.2 kg (97 lb 7.1 oz), last menstrual period 03/06/2013, SpO2 94 %.  General appearance: alert and cooperative Head: Normocephalic, without obvious abnormality, atraumatic Eyes: negative Throat: lips, mucosa, and tongue normal; teeth and gums normal Resp: diminished breath sounds bilaterally Chest wall: no tenderness Cardio: regular rate and rhythm, S1, S2 normal, no murmur, click, rub or gallop DG:UYQI tenderness in epigastrium Extremities: edema 1+ bilat Skin: Skin color, texture, turgor normal. No rashes or lesions Neurologic: Grossly normal Results for orders placed or performed during the hospital encounter of 06/11/16 (from the past 48 hour(s))  Glucose, capillary     Status: Abnormal   Collection Time: 06/12/16  4:51 PM  Result Value Ref Range   Glucose-Capillary 245 (H) 65 - 99 mg/dL  Basic metabolic panel     Status: Abnormal   Collection Time: 06/12/16  8:38 PM  Result Value Ref Range   Sodium 139 135 - 145 mmol/L   Potassium 3.9 3.5 - 5.1 mmol/L    Comment: DELTA CHECK NOTED REPEATED TO VERIFY NO VISIBLE HEMOLYSIS    Chloride 104 101 - 111 mmol/L   CO2 21 (L) 22 - 32 mmol/L   Glucose, Bld 301 (H) 65 - 99 mg/dL   BUN 37 (H) 6 - 20 mg/dL   Creatinine, Ser 2.53 (H) 0.44 - 1.00 mg/dL   Calcium 7.0 (L) 8.9 - 10.3 mg/dL   GFR calc non Af Amer 20 (L) >60 mL/min   GFR calc Af Amer 23 (L) >60 mL/min    Comment: (NOTE) The eGFR has been calculated using the CKD EPI equation. This calculation has not been validated in all clinical situations. eGFR's persistently <60 mL/min signify possible Chronic Kidney Disease.    Anion gap 14 5 - 15  CBC with Differential/Platelet     Status: Abnormal   Collection Time: 06/12/16  8:38 PM  Result Value Ref Range   WBC 24.3 (H) 4.0 -  10.5 K/uL   RBC 2.80 (L) 3.87 - 5.11 MIL/uL   Hemoglobin 7.3 (L) 12.0 - 15.0 g/dL   HCT 22.0 (L) 36.0 - 46.0 %   MCV 78.6 78.0 - 100.0 fL   MCH 26.1 26.0 - 34.0 pg   MCHC 33.2 30.0 - 36.0 g/dL   RDW 14.5 11.5 - 15.5 %   Platelets 624 (H) 150 - 400 K/uL   Neutrophils Relative % 93 %   Lymphocytes Relative 6 %   Monocytes Relative 1 %   Eosinophils Relative 0 %  Basophils Relative 0 %   Neutro Abs 22.6 (H) 1.7 - 7.7 K/uL   Lymphs Abs 1.5 0.7 - 4.0 K/uL   Monocytes Absolute 0.2 0.1 - 1.0 K/uL   Eosinophils Absolute 0.0 0.0 - 0.7 K/uL   Basophils Absolute 0.0 0.0 - 0.1 K/uL   WBC Morphology INCREASED BANDS (>20% BANDS)   Lactic acid, plasma     Status: Abnormal   Collection Time: 06/12/16  8:38 PM  Result Value Ref Range   Lactic Acid, Venous 4.1 (HH) 0.5 - 1.9 mmol/L    Comment: CRITICAL RESULT CALLED TO, READ BACK BY AND VERIFIED WITH: L HUDSON RN 2114 06/12/16 A NAVARRO   Magnesium     Status: None   Collection Time: 06/12/16  8:38 PM  Result Value Ref Range   Magnesium 2.3 1.7 - 2.4 mg/dL  Glucose, capillary     Status: Abnormal   Collection Time: 06/12/16  9:51 PM  Result Value Ref Range   Glucose-Capillary 285 (H) 65 - 99 mg/dL   Comment 1 Notify RN   Lactic acid, plasma     Status: None   Collection Time: 06/13/16 12:54 AM  Result Value Ref Range   Lactic Acid, Venous 1.9 0.5 - 1.9 mmol/L  Lactic acid, plasma     Status: None   Collection Time: 06/13/16  3:48 AM  Result Value Ref Range   Lactic Acid, Venous 1.5 0.5 - 1.9 mmol/L  CBC with Differential/Platelet     Status: Abnormal   Collection Time: 06/13/16  3:48 AM  Result Value Ref Range   WBC 23.0 (H) 4.0 - 10.5 K/uL   RBC 2.50 (L) 3.87 - 5.11 MIL/uL   Hemoglobin 6.6 (LL) 12.0 - 15.0 g/dL    Comment: CRITICAL RESULT CALLED TO, READ BACK BY AND VERIFIED WITH: WELBORNE,C. RN '@0825'$  ON 12.22.17 BY NMCCOY    HCT 19.9 (L) 36.0 - 46.0 %   MCV 79.6 78.0 - 100.0 fL   MCH 26.4 26.0 - 34.0 pg   MCHC 33.2 30.0 -  36.0 g/dL   RDW 14.9 11.5 - 15.5 %   Platelets 498 (H) 150 - 400 K/uL   Neutrophils Relative % 92 %   Neutro Abs 21.3 (H) 1.7 - 7.7 K/uL   Lymphocytes Relative 3 %   Lymphs Abs 0.6 (L) 0.7 - 4.0 K/uL   Monocytes Relative 5 %   Monocytes Absolute 1.1 (H) 0.1 - 1.0 K/uL   Eosinophils Relative 0 %   Eosinophils Absolute 0.0 0.0 - 0.7 K/uL   Basophils Relative 0 %   Basophils Absolute 0.0 0.0 - 0.1 K/uL  Basic metabolic panel     Status: Abnormal   Collection Time: 06/13/16  3:48 AM  Result Value Ref Range   Sodium 137 135 - 145 mmol/L   Potassium 3.5 3.5 - 5.1 mmol/L   Chloride 108 101 - 111 mmol/L   CO2 17 (L) 22 - 32 mmol/L   Glucose, Bld 251 (H) 65 - 99 mg/dL   BUN 34 (H) 6 - 20 mg/dL   Creatinine, Ser 2.33 (H) 0.44 - 1.00 mg/dL   Calcium 6.2 (LL) 8.9 - 10.3 mg/dL    Comment: CRITICAL RESULT CALLED TO, READ BACK BY AND VERIFIED WITH: WELBORN,C. RN AT 2952 06/13/16 MULLINS,T    GFR calc non Af Amer 22 (L) >60 mL/min   GFR calc Af Amer 26 (L) >60 mL/min    Comment: (NOTE) The eGFR has been calculated using the CKD EPI equation.  This calculation has not been validated in all clinical situations. eGFR's persistently <60 mL/min signify possible Chronic Kidney Disease.    Anion gap 12 5 - 15  Magnesium     Status: None   Collection Time: 06/13/16  3:48 AM  Result Value Ref Range   Magnesium 1.8 1.7 - 2.4 mg/dL  Glucose, capillary     Status: Abnormal   Collection Time: 06/13/16  7:28 AM  Result Value Ref Range   Glucose-Capillary 279 (H) 65 - 99 mg/dL  Hemoglobin and Hematocrit     Status: Abnormal   Collection Time: 06/13/16  8:43 AM  Result Value Ref Range   Hemoglobin 7.1 (L) 12.0 - 15.0 g/dL   HCT 21.2 (L) 36.0 - 46.0 %  Prepare RBC     Status: None   Collection Time: 06/13/16  8:43 AM  Result Value Ref Range   Order Confirmation ORDER PROCESSED BY BLOOD BANK   Glucose, capillary     Status: Abnormal   Collection Time: 06/13/16 11:46 AM  Result Value Ref Range    Glucose-Capillary 241 (H) 65 - 99 mg/dL  Glucose, capillary     Status: Abnormal   Collection Time: 06/13/16  3:28 PM  Result Value Ref Range   Glucose-Capillary 253 (H) 65 - 99 mg/dL  Glucose, capillary     Status: Abnormal   Collection Time: 06/13/16  9:14 PM  Result Value Ref Range   Glucose-Capillary 329 (H) 65 - 99 mg/dL  Basic metabolic panel     Status: Abnormal   Collection Time: 06/14/16  4:00 AM  Result Value Ref Range   Sodium 141 135 - 145 mmol/L   Potassium 3.6 3.5 - 5.1 mmol/L   Chloride 112 (H) 101 - 111 mmol/L   CO2 18 (L) 22 - 32 mmol/L   Glucose, Bld 254 (H) 65 - 99 mg/dL   BUN 42 (H) 6 - 20 mg/dL   Creatinine, Ser 2.52 (H) 0.44 - 1.00 mg/dL   Calcium 6.2 (LL) 8.9 - 10.3 mg/dL    Comment: CRITICAL RESULT CALLED TO, READ BACK BY AND VERIFIED WITH: CLARK,A RN 12.23.17 @0522  ZANDO,C    GFR calc non Af Amer 20 (L) >60 mL/min   GFR calc Af Amer 24 (L) >60 mL/min    Comment: (NOTE) The eGFR has been calculated using the CKD EPI equation. This calculation has not been validated in all clinical situations. eGFR's persistently <60 mL/min signify possible Chronic Kidney Disease.    Anion gap 11 5 - 15  Glucose, capillary     Status: Abnormal   Collection Time: 06/14/16  7:31 AM  Result Value Ref Range   Glucose-Capillary 252 (H) 65 - 99 mg/dL   Comment 1 Notify RN    Comment 2 Document in Chart   Glucose, capillary     Status: Abnormal   Collection Time: 06/14/16 11:59 AM  Result Value Ref Range   Glucose-Capillary 241 (H) 65 - 99 mg/dL   Dg Chest Port 1 View  Result Date: 06/12/2016 CLINICAL DATA:  Fever and chills sepsis EXAM: PORTABLE CHEST 1 VIEW COMPARISON:  01/28/2016 FINDINGS: Slightly elevated right diaphragm. No acute consolidation or pleural effusion. Minimal linear peripheral opacities on the right could relate to linear atelectasis. Heart size upper normal. Pulmonary vascularity within normal limits. No pneumothorax. IMPRESSION: 1. No acute  consolidation or effusion 2. Minimal linear peripheral opacities on the right could relate to atelectasis. Electronically Signed   By: Madie Reno.D.  On: 06/12/2016 22:10   Dg Abd Portable 1v  Result Date: 06/12/2016 CLINICAL DATA:  55 year old female with abdominal pain, colitis, and abdominal distention. EXAM: PORTABLE ABDOMEN - 1 VIEW COMPARISON:  Abdominal CT dated 06/11/2016 FINDINGS: Air is noted within the stomach and throughout the colon. Oral contrast from recent CT is seen in the distal colon and rectosigmoid. There is no bowel dilatation or evidence of obstruction. No free air or radiopaque calculi noted. The osseous structures and the soft tissues are grossly unremarkable. Right lung base linear densities likely atelectasis/scarring. IMPRESSION: No bowel obstruction or free air. Electronically Signed   By: Anner Crete M.D.   On: 06/12/2016 23:35   Assessment:  1 AKI, hemodynamically mediated  2 Volume overload 3 Anemia, ?cause 4 Thrombocytosis 5 E.coli UTI and bacteremia  Plan: 1 Diurese 2 CXR 3 iron studies 4 Antibiotics 5 Stop lisinopril  Deren Degrazia C 06/14/2016, 12:55 PM

## 2016-06-14 NOTE — Progress Notes (Signed)
CRITICAL VALUE ALERT  Critical value received:  Calcium 6.2  Date of notification:  06/14/16  Time of notification:  0450  Critical value read back: yes  Nurse who received alert:  Lucile Crater  MD notified (1st page):  Garing  Time of first page:  0530

## 2016-06-14 NOTE — Progress Notes (Signed)
PROGRESS NOTE    [name] Michael  URK:[id number] DOB: May 17, 1961 DOA: 06/11/2016 PCP: Arnoldo Morale, MD    Brief Narrative:  55 y.o.femalewith medical history significant of diabetes mellitus, hyperlipidemia, hypertension, stroke. Patient is a poor historian but states that for several days she has been having diarrhea, nausea and vomiting, and weakness. Presented to the ED with UTI, hypokalemia, hypocalcemia, hypophosphatemia, hypomagnesemia, lactic acidosis.   Assessment & Plan:   Active Problems:   Diabetes ([place])   Essential hypertension   GERD   Hypokalemia   Hypocalcemia   Acute kidney injury ([place])   Hypophosphatemia   Sepsis from E. coli - From urinary source - bactermic with E. coli - continue zosyn at this time (sensitivities show sensitive to zosyn) - consider transition to PO antibiotics later today or tomorrow am  Anemia - H/H low yesterday am - tranfused 2 units - repeat CBC after transfusion pending - low iron, low TIBC, MCV <80 - could be anemia of chronic disease or anemia due to infection - hemoccult ordered and is negative  Acute kidney injury ([place]) - Will continue to monitor suspected secondary to prerenal causes but after fluids, S creatinine remains elevated - repeat BMP this am shows Cr at 2.5 (higher than yesterday) - patient urinating and denies history of kidney disease   - will consult nephrology today - urine studies of urine sodium and urine creatinine ordered  UTI - sensitivities show sensitive to zosyn - continue zosyn - vital signs remain stable at this time  Diarrhea -CT scan reported possible colitis. - continue supportive therapy - zosyn should cover intraabdominal etiology if bacterial infection possible - only 1 documented BM yesterday    Diabetes ([place]) - CBG's elevated - continue SSI and order novolog 3 units w/ meals if patient eats >50% of meals    Essential hypertension - Monitor off antihypertensive medications  given sepsis     GERD - Placed on tums for calcium replacement but may be used for gerd    Hypokalemia - replaced with improvement in level - WNL this am  Hypomagnesemia - 1.8 yesterday - start magtab daily    Hypocalcemia - corrected for serum albumin level of 8.2 - can continue tums    DVT prophylaxis: SCDs Code Status: DNR Family Communication: patient's nephew is bedside; discussed plan with him and patient Disposition Plan: patient appears slightly improved this am- continue to monitor in SDU and may consider transfer to floor later today or tomorrow am   Consultants:   none  Procedures:   None  Antimicrobials:   Rocephin 12/20>12/22  Zosyn 12/22>    Subjective: Patient doing well this morning.  She reports she slept well and is starting to feel better.  She is eating breakfast.  Reports no fevers, no chills, no shortness of breath.  Understands plan for today.    Objective: Vitals:   06/14/16 0530 06/14/16 0600 06/14/16 0630 06/14/16 0700  BP: (!) 147/59 (!) 144/62 (!) 155/67 137/60  Pulse: 100 92 99 95  Resp: (!) 23 20 20 20   Temp:      TempSrc:      SpO2: 94% 97% 95% 95%  Weight:      Height:        Intake/Output Summary (Last 24 hours) at 06/14/16 0741 Last data filed at 06/14/16 0500  Gross per 24 hour  Intake          2586.25 ml  Output  0 ml  Net          2586.25 ml   Filed Weights   06/11/16 2119 06/12/16 2300  Weight: 37 kg (81 lb 9.1 oz) 44.2 kg (97 lb 7.1 oz)    Examination:  General exam: Appears calm and comfortable  Respiratory system: Clear to auscultation. Respiratory effort normal. Cardiovascular system: S1 & S2 heard, RRR. No JVD, murmurs, rubs, gallops or clicks. No pedal edema. Gastrointestinal system: Abdomen is nondistended, soft and nontender. No organomegaly or masses felt. Normal bowel sounds heard. Posterior bilateral CVA pain with palpation Central nervous system: Alert and oriented. No focal  neurological deficits. Extremities: Symmetric 5 x 5 power. Skin: No rashes, lesions or ulcers Psychiatry: Judgement and insight appear normal. Mood & affect appropriate.     Data Reviewed: I have personally reviewed following labs and imaging studies  CBC:  Recent Labs Lab 06/11/16 1425 06/12/16 0136 06/12/16 0744 06/12/16 2038 06/13/16 0348 06/13/16 0843  WBC 27.3* 22.4* 24.1* 24.3* 23.0*  --   NEUTROABS 24.6*  --   --  22.6* 21.3*  --   HGB 7.6* 7.0* 7.8* 7.3* 6.6* 7.1*  HCT 22.4* 20.4* 23.4* 22.0* 19.9* 21.2*  MCV 78.6 78.8 81.0 78.6 79.6  --   PLT 566* 480* 568* 624* 498*  --    Basic Metabolic Panel:  Recent Labs Lab 06/11/16 1425 06/12/16 0136 06/12/16 0744 06/12/16 2038 06/13/16 0348 06/14/16 0400  NA 131* 139 138 139 137 141  K 2.1* 2.9* 3.1* 3.9 3.5 3.6  CL 90* 107 104 104 108 112*  CO2 25 22 23  21* 17* 18*  GLUCOSE 279* 107* 118* 301* 251* 254*  BUN 43* 37* 37* 37* 34* 42*  CREATININE 2.49* 1.97* 2.28* 2.53* 2.33* 2.52*  CALCIUM 6.6* 6.0* 7.3* 7.0* 6.2* 6.2*  MG 1.1*  --  3.2* 2.3 1.8  --   PHOS 2.3*  --   --   --   --   --    GFR: Estimated Creatinine Clearance: 14.9 mL/min (by C-G formula based on SCr of 2.52 mg/dL (H)). Liver Function Tests:  Recent Labs Lab 06/11/16 1425 06/12/16 0136  AST 22 16  ALT 11* 9*  ALKPHOS 68 62  BILITOT 0.5 0.7  PROT 6.4* 5.1*  ALBUMIN 2.0* 1.7*   No results for input(s): LIPASE, AMYLASE in the last 168 hours. No results for input(s): AMMONIA in the last 168 hours. Coagulation Profile: No results for input(s): INR, PROTIME in the last 168 hours. Cardiac Enzymes:  Recent Labs Lab 06/11/16 1425  TROPONINI <0.03   BNP (last 3 results) No results for input(s): PROBNP in the last 8760 hours. HbA1C: No results for input(s): HGBA1C in the last 72 hours. CBG:  Recent Labs Lab 06/13/16 0728 06/13/16 1146 06/13/16 1528 06/13/16 2114 06/14/16 0731  GLUCAP 279* 241* 253* 329* 252*   Lipid  Profile: No results for input(s): CHOL, HDL, LDLCALC, TRIG, CHOLHDL, LDLDIRECT in the last 72 hours. Thyroid Function Tests:  Recent Labs  06/11/16 1425  TSH 1.033   Anemia Panel:  Recent Labs  06/12/16 0905  VITAMINB12 192  FOLATE 11.0  FERRITIN 373*  TIBC 174*  IRON 6*  RETICCTPCT 1.1   Sepsis Labs:  Recent Labs Lab 06/12/16 0744 06/12/16 2038 06/13/16 0054 06/13/16 0348  LATICACIDVEN 1.1 4.1* 1.9 1.5    Recent Results (from the past 240 hour(s))  Urine culture     Status: Abnormal   Collection Time: 06/11/16  2:33 PM  Result  Value Ref Range Status   Specimen Description URINE, CLEAN CATCH  Final   Special Requests NONE  Final   Culture >=100,000 COLONIES/mL ESCHERICHIA COLI (A)  Final   Report Status 06/14/2016 FINAL  Final   Organism ID, Bacteria ESCHERICHIA COLI (A)  Final      Susceptibility   Escherichia coli - MIC*    AMPICILLIN >=32 RESISTANT Resistant     CEFAZOLIN <=4 SENSITIVE Sensitive     CEFTRIAXONE <=1 SENSITIVE Sensitive     CIPROFLOXACIN <=0.25 SENSITIVE Sensitive     GENTAMICIN <=1 SENSITIVE Sensitive     IMIPENEM <=0.25 SENSITIVE Sensitive     NITROFURANTOIN <=16 SENSITIVE Sensitive     TRIMETH/SULFA <=20 SENSITIVE Sensitive     AMPICILLIN/SULBACTAM 16 INTERMEDIATE Intermediate     PIP/TAZO <=4 SENSITIVE Sensitive     Extended ESBL NEGATIVE Sensitive     * >=100,000 COLONIES/mL ESCHERICHIA COLI  MRSA PCR Screening     Status: None   Collection Time: 06/11/16  8:49 PM  Result Value Ref Range Status   MRSA by PCR NEGATIVE NEGATIVE Final    Comment:        The GeneXpert MRSA Assay (FDA approved for NASAL specimens only), is one component of a comprehensive MRSA colonization surveillance program. It is not intended to diagnose MRSA infection nor to guide or monitor treatment for MRSA infections.   Culture, blood (routine x 2)     Status: Abnormal   Collection Time: 06/12/16  1:36 AM  Result Value Ref Range Status   Specimen  Description BLOOD RIGHT ANTECUBITAL  Final   Special Requests IN PEDIATRIC BOTTLE 3ML  Final   Culture  Setup Time   Final    GRAM NEGATIVE RODS AEROBIC BOTTLE ONLY CRITICAL RESULT CALLED TO, READ BACK BY AND VERIFIED WITHKeturah Barre [facility] PHARMD [id number] 06/12/16 A BROWNING Performed at [place] [place] (A)  Final   Report Status 06/14/2016 FINAL  Final   Organism ID, Bacteria ESCHERICHIA COLI  Final      Susceptibility   Escherichia coli - MIC*    AMPICILLIN >=32 RESISTANT Resistant     CEFAZOLIN <=4 SENSITIVE Sensitive     CEFEPIME <=1 SENSITIVE Sensitive     CEFTAZIDIME <=1 SENSITIVE Sensitive     CEFTRIAXONE <=1 SENSITIVE Sensitive     CIPROFLOXACIN <=0.25 SENSITIVE Sensitive     GENTAMICIN <=1 SENSITIVE Sensitive     IMIPENEM <=0.25 SENSITIVE Sensitive     TRIMETH/SULFA <=20 SENSITIVE Sensitive     AMPICILLIN/SULBACTAM 16 INTERMEDIATE Intermediate     PIP/TAZO <=4 SENSITIVE Sensitive     Extended ESBL NEGATIVE Sensitive     * ESCHERICHIA COLI  Culture, blood (routine x 2)     Status: Abnormal   Collection Time: 06/12/16  1:36 AM  Result Value Ref Range Status   Specimen Description BLOOD RIGHT HAND  Final   Special Requests IN PEDIATRIC BOTTLE 2ML  Final   Culture  Setup Time   Final    GRAM NEGATIVE RODS AEROBIC BOTTLE ONLY CRITICAL RESULT CALLED TO, READ BACK BY AND VERIFIED WITH: D [facility] PHARMD 1840 06/12/16 A BROWNING    Culture (A)  Final    ESCHERICHIA COLI SUSCEPTIBILITIES PERFORMED ON PREVIOUS CULTURE WITHIN THE LAST 5 DAYS. Performed at [facility]    Report Status 06/14/2016 FINAL  Final  Blood Culture ID Panel (Reflexed)     Status: Abnormal   Collection Time: 06/12/16  1:36 AM  Result Value Ref Range Status   Enterococcus species NOT DETECTED NOT DETECTED Final   Listeria monocytogenes NOT DETECTED NOT DETECTED Final   Staphylococcus species NOT DETECTED NOT DETECTED Final   Staphylococcus aureus NOT DETECTED NOT  DETECTED Final   Streptococcus species NOT DETECTED NOT DETECTED Final   Streptococcus agalactiae NOT DETECTED NOT DETECTED Final   Streptococcus pneumoniae NOT DETECTED NOT DETECTED Final   Streptococcus pyogenes NOT DETECTED NOT DETECTED Final   Acinetobacter baumannii NOT DETECTED NOT DETECTED Final   Enterobacteriaceae species DETECTED (A) NOT DETECTED Final    Comment: CRITICAL RESULT CALLED TO, READ BACK BY AND VERIFIED WITH: D [place] PHARMD 1840 06/12/16 A BROWNING    Enterobacter cloacae complex NOT DETECTED NOT DETECTED Final   Escherichia coli DETECTED (A) NOT DETECTED Final    Comment: CRITICAL RESULT CALLED TO, READ BACK BY AND VERIFIED WITH: D [facility] PHARMD 1840 06/12/16 A BROWNING    Klebsiella oxytoca NOT DETECTED NOT DETECTED Final   Klebsiella pneumoniae NOT DETECTED NOT DETECTED Final   Proteus species NOT DETECTED NOT DETECTED Final   Serratia marcescens NOT DETECTED NOT DETECTED Final   Carbapenem resistance NOT DETECTED NOT DETECTED Final   Haemophilus influenzae NOT DETECTED NOT DETECTED Final   Neisseria meningitidis NOT DETECTED NOT DETECTED Final   Pseudomonas aeruginosa NOT DETECTED NOT DETECTED Final   Candida albicans NOT DETECTED NOT DETECTED Final   Candida glabrata NOT DETECTED NOT DETECTED Final   Candida krusei NOT DETECTED NOT DETECTED Final   Candida parapsilosis NOT DETECTED NOT DETECTED Final   Candida tropicalis NOT DETECTED NOT DETECTED Final    Comment: Performed at [facility]         Radiology Studies: Dg Chest Port 1 View  Result Date: 06/12/2016 CLINICAL DATA:  Fever and chills sepsis EXAM: PORTABLE CHEST 1 VIEW COMPARISON:  01/28/2016 FINDINGS: Slightly elevated right diaphragm. No acute consolidation or pleural effusion. Minimal linear peripheral opacities on the right could relate to linear atelectasis. Heart size upper normal. Pulmonary vascularity within normal limits. No pneumothorax. IMPRESSION: 1. No acute  consolidation or effusion 2. Minimal linear peripheral opacities on the right could relate to atelectasis. Electronically Signed   By: Donavan Foil M.D.   On: 06/12/2016 22:10   Dg Abd Portable 1v  Result Date: 06/12/2016 CLINICAL DATA:  55 year old female with abdominal pain, colitis, and abdominal distention. EXAM: PORTABLE ABDOMEN - 1 VIEW COMPARISON:  Abdominal CT dated 06/11/2016 FINDINGS: Air is noted within the stomach and throughout the colon. Oral contrast from recent CT is seen in the distal colon and rectosigmoid. There is no bowel dilatation or evidence of obstruction. No free air or radiopaque calculi noted. The osseous structures and the soft tissues are grossly unremarkable. Right lung base linear densities likely atelectasis/scarring. IMPRESSION: No bowel obstruction or free air. Electronically Signed   By: Anner Crete M.D.   On: 06/12/2016 23:35        Scheduled Meds: . piperacillin-tazobactam      . atorvastatin  20 mg Oral Daily  . calcium carbonate  1 tablet Oral BID WC  . insulin aspart  0-15 Units Subcutaneous TID WC  . insulin aspart  0-5 Units Subcutaneous QHS  . lisinopril  5 mg Oral Daily  . metoprolol tartrate  25 mg Oral BID  . multivitamin with minerals  1 tablet Oral Daily  . piperacillin-tazobactam (ZOSYN)  IV  2.25 g Intravenous Q6H  . sodium chloride flush  3 mL Intravenous Q12H  Continuous Infusions: . sodium chloride 75 mL/hr at 06/14/16 0451     LOS: 3 days    Time spent: 30 minutes    Loretha Stapler, MD [facility] Pager [phone]  If 7PM-7AM, please contact night-coverage [email or web address] Password [facility] 06/14/2016, 7:41 AM

## 2016-06-15 LAB — CBC WITH DIFFERENTIAL/PLATELET
BASOS PCT: 0 %
Basophils Absolute: 0 10*3/uL (ref 0.0–0.1)
EOS ABS: 0.2 10*3/uL (ref 0.0–0.7)
Eosinophils Relative: 1 %
HCT: 35.2 % — ABNORMAL LOW (ref 36.0–46.0)
Hemoglobin: 12.1 g/dL (ref 12.0–15.0)
Lymphocytes Relative: 8 %
Lymphs Abs: 1.5 10*3/uL (ref 0.7–4.0)
MCH: 27.3 pg (ref 26.0–34.0)
MCHC: 34.4 g/dL (ref 30.0–36.0)
MCV: 79.5 fL (ref 78.0–100.0)
MONO ABS: 0.8 10*3/uL (ref 0.1–1.0)
Monocytes Relative: 4 %
NEUTROS ABS: 16.3 10*3/uL — AB (ref 1.7–7.7)
Neutrophils Relative %: 87 %
PLATELETS: 366 10*3/uL (ref 150–400)
RBC: 4.43 MIL/uL (ref 3.87–5.11)
RDW: 15.7 % — ABNORMAL HIGH (ref 11.5–15.5)
WBC Morphology: INCREASED
WBC: 18.8 10*3/uL — ABNORMAL HIGH (ref 4.0–10.5)

## 2016-06-15 LAB — GLUCOSE, CAPILLARY
GLUCOSE-CAPILLARY: 209 mg/dL — AB (ref 65–99)
Glucose-Capillary: 370 mg/dL — ABNORMAL HIGH (ref 65–99)
Glucose-Capillary: 239 mg/dL — ABNORMAL HIGH (ref 65–99)
Glucose-Capillary: 231 mg/dL — ABNORMAL HIGH (ref 65–99)

## 2016-06-15 LAB — BASIC METABOLIC PANEL
Anion gap: 15 (ref 5–15)
BUN: 46 mg/dL — AB (ref 6–20)
CHLORIDE: 102 mmol/L (ref 101–111)
CO2: 22 mmol/L (ref 22–32)
CREATININE: 2.78 mg/dL — AB (ref 0.44–1.00)
Calcium: 6.8 mg/dL — ABNORMAL LOW (ref 8.9–10.3)
GFR calc Af Amer: 21 mL/min — ABNORMAL LOW (ref >60)
GFR, EST NON AFRICAN AMERICAN: 18 mL/min — AB (ref >60)
GLUCOSE: 269 mg/dL — AB (ref 65–99)
Potassium: 3.4 mmol/L — ABNORMAL LOW (ref 3.5–5.1)
SODIUM: 139 mmol/L (ref 135–145)

## 2016-06-15 MED ORDER — HYDRALAZINE HCL 20 MG/ML IJ SOLN
2.0000 mg | Freq: Four times a day (QID) | INTRAMUSCULAR | Status: DC | PRN
  Filled 2016-06-15: qty 1

## 2016-06-15 MED ORDER — CEFUROXIME AXETIL 500 MG PO TABS
500.0000 mg | ORAL_TABLET | Freq: Every day | ORAL | Status: DC
  Administered 2016-06-15 – 2016-06-17 (×3): 500 mg via ORAL
  Filled 2016-06-15 (×3): qty 1

## 2016-06-15 MED ORDER — SODIUM CHLORIDE 0.9 % IV SOLN
510.0000 mg | INTRAVENOUS | Status: AC
  Administered 2016-06-15 – 2016-06-22 (×2): 510 mg via INTRAVENOUS
  Filled 2016-06-15 (×2): qty 17

## 2016-06-15 NOTE — Progress Notes (Signed)
Assessment:  1 AKI, hemodynamically mediated  2 Volume overload, much better after diuresis 3 Anemia, with iron deficiency ?cause 4 Thrombocytosis prob due to #3 5 E.coli UTI and bacteremia  Plan: prn diuretics, mobilize, iv iron   Subjective: Interval History: Feels better, breathing better. 3300cc UOP yesterday  Objective: Vital signs in last 24 hours: Temp:  [97.8 F (36.6 C)-98.9 F (37.2 C)] 97.8 F (36.6 C) (12/24 0800) Pulse Rate:  [83-109] 97 (12/24 0800) Resp:  [19-32] 22 (12/24 0800) BP: (133-187)/(59-87) 150/70 (12/24 0700) SpO2:  [90 %-100 %] 95 % (12/24 0800) Weight change:   Intake/Output from previous day: 12/23 0701 - 12/24 0700 In: 1752.3 [I.V.:1366.3; IV Piggyback:386] Out: 3300 [Urine:3300] Intake/Output this shift: No intake/output data recorded.  General appearance: alert, cooperative and looks better Head: Normocephalic, without obvious abnormality, atraumatic Resp: rales right base, decreased BS on L Cardio: regular rate and rhythm Extremities: extremities normal, atraumatic, no cyanosis or edema  Generalized weakness  Lab Results:  Recent Labs  06/12/16 2038 06/13/16 0348 06/13/16 0843  WBC 24.3* 23.0*  --   HGB 7.3* 6.6* 7.1*  HCT 22.0* 19.9* 21.2*  PLT 624* 498*  --    BMET:  Recent Labs  06/13/16 0348 06/14/16 0400  NA 137 141  K 3.5 3.6  CL 108 112*  CO2 17* 18*  GLUCOSE 251* 254*  BUN 34* 42*  CREATININE 2.33* 2.52*  CALCIUM 6.2* 6.2*   No results for input(s): PTH in the last 72 hours. Iron Studies:  Recent Labs  06/14/16 1525  IRON 7*  TIBC 175*  FERRITIN 423*   Studies/Results: Dg Chest 1 View  Result Date: 06/14/2016 CLINICAL DATA:  Shortness of breath. EXAM: CHEST 1 VIEW COMPARISON:  06/12/2016 FINDINGS: Hazy densities at the lung bases, particularly on the right side. Difficult to exclude effusions. Upper lungs remain clear. Heart size is normal. The trachea is midline. Negative for a pneumothorax.  IMPRESSION: Subtle bibasilar chest densities. Findings could represent atelectasis. Difficult to exclude tiny effusions. Electronically Signed   By: Markus Daft M.D.   On: 06/14/2016 13:56    Scheduled: . atorvastatin  20 mg Oral Daily  . calcium carbonate  1 tablet Oral BID WC  . furosemide  120 mg Intravenous Q8H  . insulin aspart  0-15 Units Subcutaneous TID WC  . insulin aspart  0-5 Units Subcutaneous QHS  . insulin aspart  3 Units Subcutaneous TID WC  . magnesium chloride  1 tablet Oral Daily  . metoprolol tartrate  25 mg Oral BID  . multivitamin with minerals  1 tablet Oral Daily  . piperacillin-tazobactam (ZOSYN)  IV  2.25 g Intravenous Q6H  . sodium chloride flush  3 mL Intravenous Q12H     LOS: 4 days   Braydin Aloi C 06/15/2016,8:55 AM

## 2016-06-15 NOTE — Progress Notes (Signed)
PROGRESS NOTE    [name] Michael  IOE:[id number] DOB: [birth year] DOA: 06/11/2016 PCP: Arnoldo Morale, MD    Brief Narrative:  55 y.o.femalewith medical history significant of diabetes mellitus, hyperlipidemia, hypertension, stroke. Patient is a poor historian but states that for several days she has been having diarrhea, nausea and vomiting, and weakness. Presented to the ED with UTI, hypokalemia, hypocalcemia, hypophosphatemia, hypomagnesemia, lactic acidosis.    Assessment & Plan:   Active Problems:   Diabetes ([place])   Essential hypertension   GERD   Hypokalemia   Hypocalcemia   Acute kidney injury ([place])   Hypophosphatemia   Sepsis from E. coli - From urinary source - bactermic with E. coli - will transition from Zosyn to PO antibiotic at this time 2/2 patient tolerating PO and improvement - will likely use Ciprofloxacin  Anemia - CBC pending this am - low iron, low TIBC, MCV <80 - could be anemia of chronic disease or anemia due to infection - hemoccult ordered and is negative - continue to monitor- no signs of active bleeding at this time  Acute kidney injury [facility]) - nephrology consulted - concern for fluid overload - lasix drip started - UOP of 3357ml/24 hr  UTI - sensitivities show sensitive to zosyn - will transition from zosyn to PO antibiotic - vital signs remain stable at this time  Diarrhea -CT scan reported possible colitis. - continue supportive therapy - zosyn should cover intraabdominal etiology if bacterial infection possible     Diabetes ([place]) - CBG's elevated - continue to monitor - continue SSI and order novolog 3 units w/ meals if patient eats >50% of meals    Essential hypertension - blood pressures reasonably controlled    GERD - Placed on tums for calcium replacement but may be used for gerd    Hypokalemia - replaced with improvement in level - BMP pending this am  Hypomagnesemia - 1.8 yesterday - start magtab  daily    Hypocalcemia - repeat BMP pending   DVT prophylaxis: SCDs Code Status: DNR Family Communication: no family bedside this am Disposition Plan: transfer to floor today if off all drips.  If continues to improve can anticipate discharge in 48-72hours   Consultants:   Nephrology  Procedures:   None  Antimicrobials:   Rocephin 12/20>12/22  Zosyn 12/22> 12/24  Ciprofloxacin 12/24>   Subjective: Patient says she feels well.  Says she slept well last night.  Voices that she ate well yesterday.  Starting to feel stronger.  No questions or concerns noted.    Objective: Vitals:   06/15/16 0500 06/15/16 0605 06/15/16 0700 06/15/16 0800  BP: (!) 150/65 (!) 161/76 (!) 150/70   Pulse: 97 94 86 97  Resp: (!) 28 (!) 22 19 (!) 22  Temp:      TempSrc:      SpO2: 90% 93% 90% 95%  Weight:      Height:        Intake/Output Summary (Last 24 hours) at 06/15/16 0811 Last data filed at 06/15/16 0600  Gross per 24 hour  Intake          1752.25 ml  Output             3300 ml  Net         -1547.75 ml   Filed Weights   06/11/16 2119 06/12/16 2300  Weight: 37 kg (81 lb 9.1 oz) 44.2 kg (97 lb 7.1 oz)    Examination:  General exam: Appears calm and comfortable  Respiratory system: Clear to auscultation. Respiratory effort normal. Cardiovascular system: S1 & S2 heard, RRR. No JVD, murmurs, rubs, gallops or clicks. No pedal edema. Gastrointestinal system: Abdomen is nondistended, soft and nontender. No organomegaly or masses felt. Normal bowel sounds heard. Central nervous system: Alert and oriented. No focal neurological deficits. Extremities: Symmetric 5 x 5 power. Skin: No rashes, lesions or ulcers Psychiatry: Judgement and insight appear normal. Mood & affect appropriate.     Data Reviewed: I have personally reviewed following labs and imaging studies  CBC:  Recent Labs Lab 06/11/16 1425 06/12/16 0136 06/12/16 0744 06/12/16 2038 06/13/16 0348 06/13/16 0843   WBC 27.3* 22.4* 24.1* 24.3* 23.0*  --   NEUTROABS 24.6*  --   --  22.6* 21.3*  --   HGB 7.6* 7.0* 7.8* 7.3* 6.6* 7.1*  HCT 22.4* 20.4* 23.4* 22.0* 19.9* 21.2*  MCV 78.6 78.8 81.0 78.6 79.6  --   PLT 566* 480* 568* 624* 498*  --    Basic Metabolic Panel:  Recent Labs Lab 06/11/16 1425 06/12/16 0136 06/12/16 0744 06/12/16 2038 06/13/16 0348 06/14/16 0400  NA 131* 139 138 139 137 141  K 2.1* 2.9* 3.1* 3.9 3.5 3.6  CL 90* 107 104 104 108 112*  CO2 25 22 23  21* 17* 18*  GLUCOSE 279* 107* 118* 301* 251* 254*  BUN 43* 37* 37* 37* 34* 42*  CREATININE 2.49* 1.97* 2.28* 2.53* 2.33* 2.52*  CALCIUM 6.6* 6.0* 7.3* 7.0* 6.2* 6.2*  MG 1.1*  --  3.2* 2.3 1.8  --   PHOS 2.3*  --   --   --   --   --    GFR: Estimated Creatinine Clearance: 14.9 mL/min (by C-G formula based on SCr of 2.52 mg/dL (H)). Liver Function Tests:  Recent Labs Lab 06/11/16 1425 06/12/16 0136  AST 22 16  ALT 11* 9*  ALKPHOS 68 62  BILITOT 0.5 0.7  PROT 6.4* 5.1*  ALBUMIN 2.0* 1.7*   No results for input(s): LIPASE, AMYLASE in the last 168 hours. No results for input(s): AMMONIA in the last 168 hours. Coagulation Profile: No results for input(s): INR, PROTIME in the last 168 hours. Cardiac Enzymes:  Recent Labs Lab 06/11/16 1425  TROPONINI <0.03   BNP (last 3 results) No results for input(s): PROBNP in the last 8760 hours. HbA1C: No results for input(s): HGBA1C in the last 72 hours. CBG:  Recent Labs Lab 06/14/16 0731 06/14/16 1159 06/14/16 1526 06/14/16 1652 06/14/16 2119  GLUCAP 252* 241* 231* 241* 138*   Lipid Profile: No results for input(s): CHOL, HDL, LDLCALC, TRIG, CHOLHDL, LDLDIRECT in the last 72 hours. Thyroid Function Tests: No results for input(s): TSH, T4TOTAL, FREET4, T3FREE, THYROIDAB in the last 72 hours. Anemia Panel:  Recent Labs  06/12/16 0905 06/14/16 1525  VITAMINB12 192  --   FOLATE 11.0  --   FERRITIN 373* 423*  TIBC 174* 175*  IRON 6* 7*  RETICCTPCT 1.1   --    Sepsis Labs:  Recent Labs Lab 06/12/16 0744 06/12/16 2038 06/13/16 0054 06/13/16 0348  LATICACIDVEN 1.1 4.1* 1.9 1.5    Recent Results (from the past 240 hour(s))  Urine culture     Status: Abnormal   Collection Time: 06/11/16  2:33 PM  Result Value Ref Range Status   Specimen Description URINE, CLEAN CATCH  Final   Special Requests NONE  Final   Culture >=100,000 COLONIES/mL ESCHERICHIA COLI (A)  Final   Report Status 06/14/2016 FINAL  Final  Organism ID, Bacteria ESCHERICHIA COLI (A)  Final      Susceptibility   Escherichia coli - MIC*    AMPICILLIN >=32 RESISTANT Resistant     CEFAZOLIN <=4 SENSITIVE Sensitive     CEFTRIAXONE <=1 SENSITIVE Sensitive     CIPROFLOXACIN <=0.25 SENSITIVE Sensitive     GENTAMICIN <=1 SENSITIVE Sensitive     IMIPENEM <=0.25 SENSITIVE Sensitive     NITROFURANTOIN <=16 SENSITIVE Sensitive     TRIMETH/SULFA <=20 SENSITIVE Sensitive     AMPICILLIN/SULBACTAM 16 INTERMEDIATE Intermediate     PIP/TAZO <=4 SENSITIVE Sensitive     Extended ESBL NEGATIVE Sensitive     * >=100,000 COLONIES/mL ESCHERICHIA COLI  MRSA PCR Screening     Status: None   Collection Time: 06/11/16  8:49 PM  Result Value Ref Range Status   MRSA by PCR NEGATIVE NEGATIVE Final    Comment:        The GeneXpert MRSA Assay (FDA approved for NASAL specimens only), is one component of a comprehensive MRSA colonization surveillance program. It is not intended to diagnose MRSA infection nor to guide or monitor treatment for MRSA infections.   Culture, blood (routine x 2)     Status: Abnormal   Collection Time: 06/12/16  1:36 AM  Result Value Ref Range Status   Specimen Description BLOOD RIGHT ANTECUBITAL  Final   Special Requests IN PEDIATRIC BOTTLE 3ML  Final   Culture  Setup Time   Final    GRAM NEGATIVE RODS AEROBIC BOTTLE ONLY CRITICAL RESULT CALLED TO, READ BACK BY AND VERIFIED WITHKeturah Barre [facility] PHARMD 5638 06/12/16 A BROWNING Performed at [place] (A)  Final   Report Status 06/14/2016 FINAL  Final   Organism ID, Bacteria ESCHERICHIA COLI  Final      Susceptibility   Escherichia coli - MIC*    AMPICILLIN >=32 RESISTANT Resistant     CEFAZOLIN <=4 SENSITIVE Sensitive     CEFEPIME <=1 SENSITIVE Sensitive     CEFTAZIDIME <=1 SENSITIVE Sensitive     CEFTRIAXONE <=1 SENSITIVE Sensitive     CIPROFLOXACIN <=0.25 SENSITIVE Sensitive     GENTAMICIN <=1 SENSITIVE Sensitive     IMIPENEM <=0.25 SENSITIVE Sensitive     TRIMETH/SULFA <=20 SENSITIVE Sensitive     AMPICILLIN/SULBACTAM 16 INTERMEDIATE Intermediate     PIP/TAZO <=4 SENSITIVE Sensitive     Extended ESBL NEGATIVE Sensitive     * ESCHERICHIA COLI  Culture, blood (routine x 2)     Status: Abnormal   Collection Time: 06/12/16  1:36 AM  Result Value Ref Range Status   Specimen Description BLOOD RIGHT HAND  Final   Special Requests IN PEDIATRIC BOTTLE 2ML  Final   Culture  Setup Time   Final    GRAM NEGATIVE RODS AEROBIC BOTTLE ONLY CRITICAL RESULT CALLED TO, READ BACK BY AND VERIFIED WITH: D [facility] PHARMD 1840 06/12/16 A BROWNING    Culture (A)  Final    ESCHERICHIA COLI SUSCEPTIBILITIES PERFORMED ON PREVIOUS CULTURE WITHIN THE LAST 5 DAYS. Performed at [facility]    Report Status 06/14/2016 FINAL  Final  Blood Culture ID Panel (Reflexed)     Status: Abnormal   Collection Time: 06/12/16  1:36 AM  Result Value Ref Range Status   Enterococcus species NOT DETECTED NOT DETECTED Final   Listeria monocytogenes NOT DETECTED NOT DETECTED Final   Staphylococcus species NOT DETECTED NOT DETECTED Final   Staphylococcus aureus NOT DETECTED NOT DETECTED  Final   Streptococcus species NOT DETECTED NOT DETECTED Final   Streptococcus agalactiae NOT DETECTED NOT DETECTED Final   Streptococcus pneumoniae NOT DETECTED NOT DETECTED Final   Streptococcus pyogenes NOT DETECTED NOT DETECTED Final   Acinetobacter baumannii NOT DETECTED NOT DETECTED Final    Enterobacteriaceae species DETECTED (A) NOT DETECTED Final    Comment: CRITICAL RESULT CALLED TO, READ BACK BY AND VERIFIED WITH: D [place] PHARMD 1840 06/12/16 A BROWNING    Enterobacter cloacae complex NOT DETECTED NOT DETECTED Final   Escherichia coli DETECTED (A) NOT DETECTED Final    Comment: CRITICAL RESULT CALLED TO, READ BACK BY AND VERIFIED WITH: D [facility] PHARMD 1840 06/12/16 A BROWNING    Klebsiella oxytoca NOT DETECTED NOT DETECTED Final   Klebsiella pneumoniae NOT DETECTED NOT DETECTED Final   Proteus species NOT DETECTED NOT DETECTED Final   Serratia marcescens NOT DETECTED NOT DETECTED Final   Carbapenem resistance NOT DETECTED NOT DETECTED Final   Haemophilus influenzae NOT DETECTED NOT DETECTED Final   Neisseria meningitidis NOT DETECTED NOT DETECTED Final   Pseudomonas aeruginosa NOT DETECTED NOT DETECTED Final   Candida albicans NOT DETECTED NOT DETECTED Final   Candida glabrata NOT DETECTED NOT DETECTED Final   Candida krusei NOT DETECTED NOT DETECTED Final   Candida parapsilosis NOT DETECTED NOT DETECTED Final   Candida tropicalis NOT DETECTED NOT DETECTED Final    Comment: Performed at [facility]         Radiology Studies: Dg Chest 1 View  Result Date: 06/14/2016 CLINICAL DATA:  Shortness of breath. EXAM: CHEST 1 VIEW COMPARISON:  06/12/2016 FINDINGS: Hazy densities at the lung bases, particularly on the right side. Difficult to exclude effusions. Upper lungs remain clear. Heart size is normal. The trachea is midline. Negative for a pneumothorax. IMPRESSION: Subtle bibasilar chest densities. Findings could represent atelectasis. Difficult to exclude tiny effusions. Electronically Signed   By: Markus Daft M.D.   On: 06/14/2016 13:56        Scheduled Meds: . atorvastatin  20 mg Oral Daily  . calcium carbonate  1 tablet Oral BID WC  . furosemide  120 mg Intravenous Q8H  . insulin aspart  0-15 Units Subcutaneous TID WC  . insulin aspart  0-5  Units Subcutaneous QHS  . insulin aspart  3 Units Subcutaneous TID WC  . magnesium chloride  1 tablet Oral Daily  . metoprolol tartrate  25 mg Oral BID  . multivitamin with minerals  1 tablet Oral Daily  . piperacillin-tazobactam (ZOSYN)  IV  2.25 g Intravenous Q6H  . sodium chloride flush  3 mL Intravenous Q12H   Continuous Infusions:    LOS: 4 days    Time spent: 30 minutes    Loretha Stapler, MD [facility] Pager [phone]  If 7PM-7AM, please contact night-coverage [email or web address] Password [email or web address] 06/15/2016, 8:11 AM

## 2016-06-15 NOTE — Evaluation (Signed)
Physical Therapy Evaluation Patient Details Name: [name] Michael MRN: [id number] DOB: 27-Nov-1960 Today's Date: 06/15/2016   History of Present Illness  [name] Michael is a 55 y.o. female with medical history significant of diabetes mellitus, hyperlipidemia, hypertension, stroke. 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 Pt currently with functional limitations due to the deficits listed below (see PT Problem List).  Pt will benefit from skilled PT to increase their independence and safety with mobility to allow discharge to the venue listed below.  See below for details and VS     Follow Up Recommendations Home health PT (pt reports she is considering ALF)    Equipment Recommendations  Rolling walker with 5" wheels    Recommendations for Other Services       Precautions / Restrictions Precautions Precautions: Fall Restrictions Weight Bearing Restrictions: No      Mobility  Bed Mobility Overal bed mobility: Needs Assistance Bed Mobility: Supine to Sit     Supine to sit: Mod assist     General bed mobility comments: assist for scooting, assist with trunk to upright; total assist +2 to return to supine d/t near LOC  Transfers Overall transfer level: Needs assistance   Transfers: Sit to/from Stand Sit to Stand: +2 physical assistance;+2 safety/equipment;Min assist         General transfer comment: +2 for safety d/t bed ht;  pt had LOC or near LOC upon standing--+2 to return to supine; BP 146/77, HR 81, RR 26  Ambulation/Gait  unable today              Stairs            Wheelchair Mobility    Modified Rankin (Stroke Patients Only)       Balance Overall balance assessment: Needs assistance;History of Falls Sitting-balance support:  Bilateral upper extremity supported;Feet unsupported Sitting balance-Leahy Scale: Fair         Standing balance comment: unable                              Pertinent Vitals/Pain Pain Assessment: No/denies pain    Home Living Family/patient expects to be discharged to:: Private residence Living Arrangements: Other relatives (sister) Available Help at Discharge: Family Type of Home: House Home Access: Stairs to enter   Technical brewer of Steps: 4 Home Layout: One level   Additional Comments: pt did her own laundry, sister does meals    Prior Function Level of Independence: Independent               Hand Dominance        Extremity/Trunk Assessment   Upper Extremity Assessment Upper Extremity Assessment: Generalized weakness    Lower Extremity Assessment Lower Extremity Assessment: Generalized weakness       Communication   Communication: No difficulties  Cognition Arousal/Alertness: Awake/alert Behavior During Therapy: Flat affect Overall Cognitive Status: No family/caregiver present to determine baseline cognitive functioning Area of Impairment: Problem solving             Problem Solving: Slow processing;Requires verbal cues      General Comments      Exercises     Assessment/Plan    PT Assessment Patient needs continued PT  services  PT Problem List Decreased strength;Decreased range of motion;Decreased activity tolerance;Decreased mobility;Decreased knowledge of use of DME;Decreased safety awareness          PT Treatment Interventions DME instruction;Gait training;Functional mobility training;Therapeutic activities;Therapeutic exercise;Patient/family education    PT Goals (Current goals can be found in the Care Plan section)  Acute Rehab PT Goals Patient Stated Goal: go to ALF PT Goal Formulation: With patient Time For Goal Achievement: 06/22/16 Potential to Achieve Goals: Good    Frequency Min 3X/week    Barriers to discharge        Co-evaluation               End of Session   Activity Tolerance: Treatment limited secondary to medical complications (Comment) Patient left: with call bell/phone within reach;in bed;with bed alarm set Nurse Communication: Mobility status         Time: 2091-9802 PT Time Calculation (min) (ACUTE ONLY): 24 min   Charges:   PT Evaluation $PT Eval Moderate Complexity: 1 Procedure PT Treatments $Therapeutic Activity: 8-22 mins   PT G Codes:        Raha Tennison 2016-06-23, 1:09 PM

## 2016-06-16 LAB — RENAL FUNCTION PANEL
Albumin: 1.9 g/dL — ABNORMAL LOW (ref 3.5–5.0)
Anion gap: 12 (ref 5–15)
BUN: 48 mg/dL — ABNORMAL HIGH (ref 6–20)
CHLORIDE: 103 mmol/L (ref 101–111)
CO2: 22 mmol/L (ref 22–32)
Calcium: 7.2 mg/dL — ABNORMAL LOW (ref 8.9–10.3)
Creatinine, Ser: 2.77 mg/dL — ABNORMAL HIGH (ref 0.44–1.00)
GFR, EST AFRICAN AMERICAN: 21 mL/min — AB (ref >60)
GFR, EST NON AFRICAN AMERICAN: 18 mL/min — AB (ref >60)
Glucose, Bld: 196 mg/dL — ABNORMAL HIGH (ref 65–99)
POTASSIUM: 4.2 mmol/L (ref 3.5–5.1)
Phosphorus: 3.4 mg/dL (ref 2.5–4.6)
SODIUM: 137 mmol/L (ref 135–145)

## 2016-06-16 LAB — GLUCOSE, CAPILLARY
GLUCOSE-CAPILLARY: 208 mg/dL — AB (ref 65–99)
GLUCOSE-CAPILLARY: 284 mg/dL — AB (ref 65–99)
GLUCOSE-CAPILLARY: 190 mg/dL — AB (ref 65–99)
Glucose-Capillary: 354 mg/dL — ABNORMAL HIGH (ref 65–99)

## 2016-06-16 LAB — TYPE AND SCREEN
ABO/RH(D): A NEG
ANTIBODY SCREEN: NEGATIVE
UNIT DIVISION: 0
Unit division: 0

## 2016-06-16 NOTE — Progress Notes (Signed)
PROGRESS NOTE    [name] Michael  GYB:[id number] DOB: 1961-05-17 DOA: 06/11/2016 PCP: Arnoldo Morale, MD    Brief Narrative:  55 y.o.femalewith medical history significant of diabetes mellitus, hyperlipidemia, hypertension, stroke. Patient is a poor historian but states that for several days she has been having diarrhea, nausea and vomiting, and weakness. Presented to the ED with UTI, hypokalemia, hypocalcemia, hypophosphatemia, hypomagnesemia, lactic acidosis.    Assessment & Plan:   Active Problems:   Diabetes ([place])   Essential hypertension   GERD   Hypokalemia   Hypocalcemia   Acute kidney injury ([place])   Hypophosphatemia   Sepsis from E. coli - From urinary source - bactermic with E. coli - will transition from Zosyn to PO antibiotic at this time 2/2 patient tolerating PO and improvement - transitioned to Ceftin   Anemia - CBC pending this am - low iron, low TIBC, MCV <80 - could be anemia of chronic disease or anemia due to infection - hemoccult ordered and is negative - will repeat CBC in am  Acute kidney injury [facility]) - nephrology consulted - concern for fluid overload - UOP of 4149ml/24 hr two days ago - continue to monitor urine output - monitor Cr with daily BMPs  UTI - sensitivities show sensitive to zosyn - will transition from zosyn to Ceftin - vital signs remain stable at this time  Diarrhea -CT scan reported possible colitis. - diarrhea has resolved, abdomen is nontender    Diabetes ([facility]) - CBG's elevated - continue SSI and order novolog 3 units w/ meals if patient eats >50% of meals - patient on glipizide outpatient - may need to increase to 4 units w/ meals if patient eats >50% of meals    Essential hypertension - blood pressures reasonably controlled on PO metoprolol    GERD - Placed on tums for calcium replacement but may be used for gerd    Hypokalemia - WNL this am  Hypomagnesemia - will repeat lab tomorrow - continue mag  tab daily    Hypocalcemia - continue to monitor - continue tums   DVT prophylaxis: SCDs Code Status: DNR Family Communication: no family bedside this am Disposition Plan: anticipate discharge in next 48 hours if kidney function improves and patient remains stable. Will need SW assistance in placement   Consultants:   Nephrology  Procedures:   None  Antimicrobials:   Rocephin 12/20>12/22  Zosyn 12/22> 12/24  Ceftin 12/24>   Subjective: Patient sitting up eating breakfast.  Says she feels well today.  Voices she is hopeful to go to an Assisted Living or to a SNF at time of discharge.  Says she feels hopeless at her sister's house and got so ill there she is worried that will happen again.  Objective: Vitals:   06/15/16 1556 06/15/16 1600 06/15/16 2100 06/16/16 0533  BP:  (!) 169/81 (!) 146/93 140/65  Pulse:  94 89 95  Resp:  (!) 26 (!) 24 20  Temp: 99.5 F (37.5 C)  98.5 F (36.9 C) 98 F (36.7 C)  TempSrc: Oral  Oral Oral  SpO2:  99% 94% 95%  Weight:      Height:        Intake/Output Summary (Last 24 hours) at 06/16/16 1357 Last data filed at 06/16/16 1045  Gross per 24 hour  Intake              240 ml  Output             4750 ml  Net            -4510 ml   Filed Weights   06/11/16 2119 06/12/16 2300  Weight: 37 kg (81 lb 9.1 oz) 44.2 kg (97 lb 7.1 oz)    Examination:  General exam: Appears calm and comfortable  Respiratory system: Clear to auscultation. Respiratory effort normal. Cardiovascular system: S1 & S2 heard, RRR. No JVD, murmurs, rubs, gallops or clicks. No pedal edema. Gastrointestinal system: Abdomen is nondistended, soft and nontender. No organomegaly or masses felt. Normal bowel sounds heard. Central nervous system: Alert and oriented. No focal neurological deficits. Extremities: Symmetric 5 x 5 power. Skin: No rashes, lesions or ulcers Psychiatry: Judgement and insight appear normal. Mood & affect appropriate.     Data Reviewed:  I have personally reviewed following labs and imaging studies  CBC:  Recent Labs Lab 06/11/16 1425 06/12/16 0136 06/12/16 0744 06/12/16 2038 06/13/16 0348 06/13/16 0843 06/15/16 0826  WBC 27.3* 22.4* 24.1* 24.3* 23.0*  --  18.8*  NEUTROABS 24.6*  --   --  22.6* 21.3*  --  16.3*  HGB 7.6* 7.0* 7.8* 7.3* 6.6* 7.1* 12.1  HCT 22.4* 20.4* 23.4* 22.0* 19.9* 21.2* 35.2*  MCV 78.6 78.8 81.0 78.6 79.6  --  79.5  PLT 566* 480* 568* 624* 498*  --  604   Basic Metabolic Panel:  Recent Labs Lab 06/11/16 1425  06/12/16 0744 06/12/16 2038 06/13/16 0348 06/14/16 0400 06/15/16 0826 06/16/16 0430  NA 131*  < > 138 139 137 141 139 137  K 2.1*  < > 3.1* 3.9 3.5 3.6 3.4* 4.2  CL 90*  < > 104 104 108 112* 102 103  CO2 25  < > 23 21* 17* 18* 22 22  GLUCOSE 279*  < > 118* 301* 251* 254* 269* 196*  BUN 43*  < > 37* 37* 34* 42* 46* 48*  CREATININE 2.49*  < > 2.28* 2.53* 2.33* 2.52* 2.78* 2.77*  CALCIUM 6.6*  < > 7.3* 7.0* 6.2* 6.2* 6.8* 7.2*  MG 1.1*  --  3.2* 2.3 1.8  --   --   --   PHOS 2.3*  --   --   --   --   --   --  3.4  < > = values in this interval not displayed. GFR: Estimated Creatinine Clearance: 13.5 mL/min (by C-G formula based on SCr of 2.77 mg/dL (H)). Liver Function Tests:  Recent Labs Lab 06/11/16 1425 06/12/16 0136 06/16/16 0430  AST 22 16  --   ALT 11* 9*  --   ALKPHOS 68 62  --   BILITOT 0.5 0.7  --   PROT 6.4* 5.1*  --   ALBUMIN 2.0* 1.7* 1.9*   No results for input(s): LIPASE, AMYLASE in the last 168 hours. No results for input(s): AMMONIA in the last 168 hours. Coagulation Profile: No results for input(s): INR, PROTIME in the last 168 hours. Cardiac Enzymes:  Recent Labs Lab 06/11/16 1425  TROPONINI <0.03   BNP (last 3 results) No results for input(s): PROBNP in the last 8760 hours. HbA1C: No results for input(s): HGBA1C in the last 72 hours. CBG:  Recent Labs Lab 06/15/16 1057 06/15/16 1554 06/15/16 2103 06/16/16 0745 06/16/16 1139    GLUCAP 370* 239* 231* 208* 284*   Lipid Profile: No results for input(s): CHOL, HDL, LDLCALC, TRIG, CHOLHDL, LDLDIRECT in the last 72 hours. Thyroid Function Tests: No results for input(s): TSH, T4TOTAL, FREET4, T3FREE, THYROIDAB in the last 72 hours. Anemia  Panel:  Recent Labs  06/14/16 1525  FERRITIN 423*  TIBC 175*  IRON 7*   Sepsis Labs:  Recent Labs Lab 06/12/16 0744 06/12/16 2038 06/13/16 0054 06/13/16 0348  LATICACIDVEN 1.1 4.1* 1.9 1.5    Recent Results (from the past 240 hour(s))  Urine culture     Status: Abnormal   Collection Time: 06/11/16  2:33 PM  Result Value Ref Range Status   Specimen Description URINE, CLEAN CATCH  Final   Special Requests NONE  Final   Culture >=100,000 COLONIES/mL ESCHERICHIA COLI (A)  Final   Report Status 06/14/2016 FINAL  Final   Organism ID, Bacteria ESCHERICHIA COLI (A)  Final      Susceptibility   Escherichia coli - MIC*    AMPICILLIN >=32 RESISTANT Resistant     CEFAZOLIN <=4 SENSITIVE Sensitive     CEFTRIAXONE <=1 SENSITIVE Sensitive     CIPROFLOXACIN <=0.25 SENSITIVE Sensitive     GENTAMICIN <=1 SENSITIVE Sensitive     IMIPENEM <=0.25 SENSITIVE Sensitive     NITROFURANTOIN <=16 SENSITIVE Sensitive     TRIMETH/SULFA <=20 SENSITIVE Sensitive     AMPICILLIN/SULBACTAM 16 INTERMEDIATE Intermediate     PIP/TAZO <=4 SENSITIVE Sensitive     Extended ESBL NEGATIVE Sensitive     * >=100,000 COLONIES/mL ESCHERICHIA COLI  MRSA PCR Screening     Status: None   Collection Time: 06/11/16  8:49 PM  Result Value Ref Range Status   MRSA by PCR NEGATIVE NEGATIVE Final    Comment:        The GeneXpert MRSA Assay (FDA approved for NASAL specimens only), is one component of a comprehensive MRSA colonization surveillance program. It is not intended to diagnose MRSA infection nor to guide or monitor treatment for MRSA infections.   Culture, blood (routine x 2)     Status: Abnormal   Collection Time: 06/12/16  1:36 AM  Result  Value Ref Range Status   Specimen Description BLOOD RIGHT ANTECUBITAL  Final   Special Requests IN PEDIATRIC BOTTLE 3ML  Final   Culture  Setup Time   Final    GRAM NEGATIVE RODS AEROBIC BOTTLE ONLY CRITICAL RESULT CALLED TO, READ BACK BY AND VERIFIED WITHKeturah Barre [facility] PHARMD [id number] 06/12/16 A BROWNING Performed at [place] (A)  Final   Report Status 06/14/2016 FINAL  Final   Organism ID, Bacteria ESCHERICHIA COLI  Final      Susceptibility   Escherichia coli - MIC*    AMPICILLIN >=32 RESISTANT Resistant     CEFAZOLIN <=4 SENSITIVE Sensitive     CEFEPIME <=1 SENSITIVE Sensitive     CEFTAZIDIME <=1 SENSITIVE Sensitive     CEFTRIAXONE <=1 SENSITIVE Sensitive     CIPROFLOXACIN <=0.25 SENSITIVE Sensitive     GENTAMICIN <=1 SENSITIVE Sensitive     IMIPENEM <=0.25 SENSITIVE Sensitive     TRIMETH/SULFA <=20 SENSITIVE Sensitive     AMPICILLIN/SULBACTAM 16 INTERMEDIATE Intermediate     PIP/TAZO <=4 SENSITIVE Sensitive     Extended ESBL NEGATIVE Sensitive     * ESCHERICHIA COLI  Culture, blood (routine x 2)     Status: Abnormal   Collection Time: 06/12/16  1:36 AM  Result Value Ref Range Status   Specimen Description BLOOD RIGHT HAND  Final   Special Requests IN PEDIATRIC BOTTLE 2ML  Final   Culture  Setup Time   Final    GRAM NEGATIVE RODS AEROBIC BOTTLE ONLY CRITICAL RESULT CALLED TO, READ BACK BY  AND VERIFIED WITHKeturah Barre [facility] PHARMD 1840 06/12/16 A BROWNING    Culture (A)  Final    ESCHERICHIA COLI SUSCEPTIBILITIES PERFORMED ON PREVIOUS CULTURE WITHIN THE LAST 5 DAYS. Performed at [facility]    Report Status 06/14/2016 FINAL  Final  Blood Culture ID Panel (Reflexed)     Status: Abnormal   Collection Time: 06/12/16  1:36 AM  Result Value Ref Range Status   Enterococcus species NOT DETECTED NOT DETECTED Final   Listeria monocytogenes NOT DETECTED NOT DETECTED Final   Staphylococcus species NOT DETECTED NOT DETECTED Final    Staphylococcus aureus NOT DETECTED NOT DETECTED Final   Streptococcus species NOT DETECTED NOT DETECTED Final   Streptococcus agalactiae NOT DETECTED NOT DETECTED Final   Streptococcus pneumoniae NOT DETECTED NOT DETECTED Final   Streptococcus pyogenes NOT DETECTED NOT DETECTED Final   Acinetobacter baumannii NOT DETECTED NOT DETECTED Final   Enterobacteriaceae species DETECTED (A) NOT DETECTED Final    Comment: CRITICAL RESULT CALLED TO, READ BACK BY AND VERIFIED WITH: D [place] PHARMD 1840 06/12/16 A BROWNING    Enterobacter cloacae complex NOT DETECTED NOT DETECTED Final   Escherichia coli DETECTED (A) NOT DETECTED Final    Comment: CRITICAL RESULT CALLED TO, READ BACK BY AND VERIFIED WITH: D [facility] PHARMD 1840 06/12/16 A BROWNING    Klebsiella oxytoca NOT DETECTED NOT DETECTED Final   Klebsiella pneumoniae NOT DETECTED NOT DETECTED Final   Proteus species NOT DETECTED NOT DETECTED Final   Serratia marcescens NOT DETECTED NOT DETECTED Final   Carbapenem resistance NOT DETECTED NOT DETECTED Final   Haemophilus influenzae NOT DETECTED NOT DETECTED Final   Neisseria meningitidis NOT DETECTED NOT DETECTED Final   Pseudomonas aeruginosa NOT DETECTED NOT DETECTED Final   Candida albicans NOT DETECTED NOT DETECTED Final   Candida glabrata NOT DETECTED NOT DETECTED Final   Candida krusei NOT DETECTED NOT DETECTED Final   Candida parapsilosis NOT DETECTED NOT DETECTED Final   Candida tropicalis NOT DETECTED NOT DETECTED Final    Comment: Performed at [facility]         Radiology Studies: No results found.      Scheduled Meds: . atorvastatin  20 mg Oral Daily  . calcium carbonate  1 tablet Oral BID WC  . cefUROXime  500 mg Oral Q lunch  . ferumoxytol  510 mg Intravenous Weekly  . insulin aspart  0-15 Units Subcutaneous TID WC  . insulin aspart  0-5 Units Subcutaneous QHS  . insulin aspart  3 Units Subcutaneous TID WC  . magnesium chloride  1 tablet Oral Daily  .  metoprolol tartrate  25 mg Oral BID  . multivitamin with minerals  1 tablet Oral Daily  . sodium chloride flush  3 mL Intravenous Q12H   Continuous Infusions:    LOS: 5 days    Time spent: 30 minutes    Loretha Stapler, MD [facility] Pager [phone]  If 7PM-7AM, please contact night-coverage [email or web address] Password [email or web address] 06/16/2016, 1:57 PM

## 2016-06-17 ENCOUNTER — Inpatient Hospital Stay (HOSPITAL_COMMUNITY): Payer: Medicaid Other

## 2016-06-17 DIAGNOSIS — R109 Unspecified abdominal pain: Secondary | ICD-10-CM

## 2016-06-17 DIAGNOSIS — I509 Heart failure, unspecified: Secondary | ICD-10-CM

## 2016-06-17 DIAGNOSIS — K529 Noninfective gastroenteritis and colitis, unspecified: Secondary | ICD-10-CM

## 2016-06-17 DIAGNOSIS — R1010 Upper abdominal pain, unspecified: Secondary | ICD-10-CM

## 2016-06-17 LAB — GLUCOSE, CAPILLARY
GLUCOSE-CAPILLARY: 272 mg/dL — AB (ref 65–99)
GLUCOSE-CAPILLARY: 321 mg/dL — AB (ref 65–99)
Glucose-Capillary: 140 mg/dL — ABNORMAL HIGH (ref 65–99)
Glucose-Capillary: 218 mg/dL — ABNORMAL HIGH (ref 65–99)

## 2016-06-17 LAB — BASIC METABOLIC PANEL
Anion gap: 12 (ref 5–15)
BUN: 48 mg/dL — AB (ref 6–20)
CALCIUM: 7.8 mg/dL — AB (ref 8.9–10.3)
CO2: 24 mmol/L (ref 22–32)
CREATININE: 2.74 mg/dL — AB (ref 0.44–1.00)
Chloride: 100 mmol/L — ABNORMAL LOW (ref 101–111)
GFR calc Af Amer: 21 mL/min — ABNORMAL LOW (ref >60)
GFR, EST NON AFRICAN AMERICAN: 18 mL/min — AB (ref >60)
GLUCOSE: 347 mg/dL — AB (ref 65–99)
Potassium: 3.1 mmol/L — ABNORMAL LOW (ref 3.5–5.1)
Sodium: 136 mmol/L (ref 135–145)

## 2016-06-17 LAB — ECHOCARDIOGRAM COMPLETE
Height: 54.5 in
Weight: 1312.18 oz

## 2016-06-17 LAB — CBC
HCT: 34 % — ABNORMAL LOW (ref 36.0–46.0)
HEMOGLOBIN: 11.5 g/dL — AB (ref 12.0–15.0)
MCH: 26.9 pg (ref 26.0–34.0)
MCHC: 33.8 g/dL (ref 30.0–36.0)
MCV: 79.4 fL (ref 78.0–100.0)
Platelets: 301 10*3/uL (ref 150–400)
RBC: 4.28 MIL/uL (ref 3.87–5.11)
RDW: 15.2 % (ref 11.5–15.5)
WBC: 19.9 10*3/uL — ABNORMAL HIGH (ref 4.0–10.5)

## 2016-06-17 LAB — MAGNESIUM: MAGNESIUM: 1.6 mg/dL — AB (ref 1.7–2.4)

## 2016-06-17 MED ORDER — MAGNESIUM CHLORIDE 64 MG PO TBEC
1.0000 | DELAYED_RELEASE_TABLET | Freq: Two times a day (BID) | ORAL | Status: DC
  Administered 2016-06-17 – 2016-06-26 (×19): 64 mg via ORAL
  Filled 2016-06-17 (×21): qty 1

## 2016-06-17 MED ORDER — POTASSIUM CHLORIDE CRYS ER 20 MEQ PO TBCR
40.0000 meq | EXTENDED_RELEASE_TABLET | ORAL | Status: AC
  Administered 2016-06-17 (×2): 40 meq via ORAL
  Filled 2016-06-17 (×2): qty 2

## 2016-06-17 MED ORDER — SODIUM CHLORIDE 0.9 % IV SOLN
500.0000 mg | Freq: Two times a day (BID) | INTRAVENOUS | Status: DC
  Administered 2016-06-17 – 2016-06-23 (×14): 500 mg via INTRAVENOUS
  Filled 2016-06-17 (×15): qty 0.5

## 2016-06-17 MED ORDER — CALCIUM CARBONATE ANTACID 500 MG PO CHEW
2.0000 | CHEWABLE_TABLET | Freq: Two times a day (BID) | ORAL | Status: DC
  Administered 2016-06-17 – 2016-07-01 (×29): 400 mg via ORAL
  Filled 2016-06-17 (×29): qty 2

## 2016-06-17 MED ORDER — INSULIN ASPART PROT & ASPART (70-30 MIX) 100 UNIT/ML ~~LOC~~ SUSP
10.0000 [IU] | Freq: Two times a day (BID) | SUBCUTANEOUS | Status: DC
  Administered 2016-06-17 – 2016-06-18 (×3): 10 [IU] via SUBCUTANEOUS
  Filled 2016-06-17: qty 10

## 2016-06-17 NOTE — Progress Notes (Signed)
Inpatient Diabetes Program Recommendations  AACE/ADA: New Consensus Statement on Inpatient Glycemic Control (2015)  Target Ranges:  Prepandial:   less than 140 mg/dL      Peak postprandial:   less than 180 mg/dL (1-2 hours)      Critically ill patients:  140 - 180 mg/dL   Lab Results  Component Value Date   GLUCAP 272 (H) 06/17/2016   HGBA1C 10.9 04/29/2016    Review of Glycemic Control Results for [name] Michael, [name] Michael (MRN [id number]) as of 06/17/2016 09:24  Ref. Range 06/16/2016 07:45 06/16/2016 11:39 06/16/2016 16:46 06/16/2016 21:59 06/17/2016 07:32  Glucose-Capillary Latest Ref Range: 65 - 99 mg/dL 208 (H) 284 (H) 354 (H) 190 (H) 272 (H)   Admit with: UTI/ Sepsis  History: DM  Home DM Meds: Metformin 1000 mg BID                             Glipizide 5 mg BID  Current Insulin Orders:70/30 insulin 10 units bid+Novolog meal coverage 3 units tid+ Novolog Moderate Correction Scale/ SSI (0-15 units) TID AC + HS  May consider discontinue Novolog meal coverage since starting on 70/30 insulin 10 units bid.  -[facility] at [facility] [facility].  Last saw Dr. Jarold Song on 04/29/16.  At that visit, Glipizide was increased to 5 mg BID (was previously taking 2.5 mg BID).  Thank you, Nani Gasser. Hopie Pellegrin, RN, MSN, CDE Inpatient Glycemic Control Team Team Pager [phone] (8am-5pm) 06/17/2016 9:24 AM

## 2016-06-17 NOTE — Progress Notes (Signed)
Pharmacy Antibiotic Note  [name] Michael is a 55 y.o. female admitted on 06/11/2016 with sepsis.  She was initially started on Zosyn, however de-escalated antibiotics for +Ecoli UTI.  She is currently on day#6 antibiotics.  Patient is not clinically improving,  WBC continues to rise.  No improvement in renal function.   Pharmacy has been consulted to broaden antibiotics back to Meropenem.  Plan: Meropenem 500mg  IV q12h Monitor renal function & patient progress  Height: 4' 6.5" (138.4 cm) Weight: 82 lb 0.2 oz (37.2 kg) IBW/kg (Calculated) : 32.85  Temp (24hrs), Avg:99.3 F (37.4 C), Min:98.4 F (36.9 C), Max:99.9 F (37.7 C)   Recent Labs Lab 06/11/16 1639  06/12/16 0744 06/12/16 2038 06/13/16 0054 06/13/16 0348 06/14/16 0400 06/15/16 0826 06/16/16 0430 06/17/16 0816 06/17/16 0818  WBC  --   < > 24.1* 24.3*  --  23.0*  --  18.8*  --   --  19.9*  CREATININE  --   < > 2.28* 2.53*  --  2.33* 2.52* 2.78* 2.77* 2.74*  --   LATICACIDVEN 3.84*  --  1.1 4.1* 1.9 1.5  --   --   --   --   --   < > = values in this interval not displayed.  Estimated Creatinine Clearance: 12 mL/min (by C-G formula based on SCr of 2.74 mg/dL (H)).    Allergies  Allergen Reactions  . Hydrocodone Nausea And Vomiting    Antimicrobials this admission: 12/21 Zosyn >> 12/24 12/20 Ceftriaxone  >> 12/21 12/24 ceftin (for bacteremia/UTI, MD not concern about colitis)>>12/26 12/26 Meropenem>>  Dose adjustments this admission:  Microbiology results: 12/21 Zosyn >> 12/24 12/20 Ceftriaxone  >> 12/21 12/24 ceftin (for bacteremia/UTI, MD not concern about colitis)>>12/26 12/26 Meropenem>>  Thank you for allowing pharmacy to be a part of this patient's care.  Netta Cedars, PharmD, BCPS Pager: [phone] 06/17/2016 9:58 AM

## 2016-06-17 NOTE — Progress Notes (Signed)
Subjective: Interval History: Feels better, breathing better. 3300cc UOP yesterday  Objective: Vital signs in last 24 hours: Temp:  [98.4 F (36.9 C)-99.9 F (37.7 C)] 98.4 F (36.9 C) (12/26 0443) Pulse Rate:  [94-100] 95 (12/26 0443) Resp:  [16-20] 16 (12/26 0443) BP: (129-136)/(49-61) 129/49 (12/26 0443) SpO2:  [93 %-94 %] 93 % (12/26 0443) Weight:  [37.2 kg (82 lb 0.2 oz)] 37.2 kg (82 lb 0.2 oz) (12/26 0443) Weight change:   Intake/Output from previous day: 12/25 0701 - 12/26 0700 In: 720 [P.O.:720] Out: 2575 [Urine:2575] Intake/Output this shift: No intake/output data recorded.  Exam: Gen: small framed WF, tired, no distress Resp: bibasilar rales, no wheezing Cardio: regular rate and rhythm Ext: no edema NF Ox 3  CT abd > bilat perirenal stranding and bilat renal edema, mult nonobst stones bilat kidneys, no hydro, no ureteral stone, normal bladder   Assessment: 1 AKI - prob combination ACEi/ bilat pyelo/ urosepsis/ nsaids. Creat was 0.4 in Aug '17, 2.4 on admission here, today is pend. Good UOP, should recover reasonably well 2 Volume overload -  Diuresed x 48 hrs, lasix dc'd yest, wt is back down to admit wt 3 Anemia - per primary 4 E.coli UTI and bacteremia 5 Pulm edema - mild, on admission, unusual presentation w urosepsis. Check echo.  6 Hx CVA 7 HTN on MTP 25 bid, home acei on hold, bp's good  Plan: labs today and tomorrow, lasix dc'd,    Kelly Splinter MD [employer] pgr [phone]   06/17/2016, 8:21 AM       Lab Results:  Recent Labs  06/15/16 0826  WBC 18.8*  HGB 12.1  HCT 35.2*  PLT 366   BMET:   Recent Labs  06/15/16 0826 06/16/16 0430  NA 139 137  K 3.4* 4.2  CL 102 103  CO2 22 22  GLUCOSE 269* 196*  BUN 46* 48*  CREATININE 2.78* 2.77*  CALCIUM 6.8* 7.2*   No results for input(s): PTH in the last 72 hours. Iron Studies:   Recent Labs  06/14/16 1525  IRON 7*  TIBC 175*  FERRITIN 423*    Studies/Results: No results found.  Scheduled: . atorvastatin  20 mg Oral Daily  . calcium carbonate  1 tablet Oral BID WC  . cefUROXime  500 mg Oral Q lunch  . ferumoxytol  510 mg Intravenous Weekly  . insulin aspart  0-15 Units Subcutaneous TID WC  . insulin aspart  0-5 Units Subcutaneous QHS  . insulin aspart  3 Units Subcutaneous TID WC  . insulin aspart protamine- aspart  10 Units Subcutaneous BID WC  . magnesium chloride  1 tablet Oral Daily  . metoprolol tartrate  25 mg Oral BID  . multivitamin with minerals  1 tablet Oral Daily  . sodium chloride flush  3 mL Intravenous Q12H

## 2016-06-17 NOTE — Progress Notes (Addendum)
PROGRESS NOTE    [name] Michael  GUR:[id number] DOB: Mar 23, 1961 DOA: 06/11/2016 PCP: Arnoldo Morale, MD    Brief Narrative:  55 y.o.femalewith medical history significant of diabetes mellitus, hyperlipidemia, hypertension, stroke. Patient is a poor historian but states that for several days she has been having diarrhea, nausea and vomiting, and weakness. Presented to the ED with UTI, hypokalemia, hypocalcemia, hypophosphatemia, hypomagnesemia, lactic acidosis.    Assessment & Plan:   Active Problems:   Diabetes ([place])   Essential hypertension   GERD   Hypokalemia   Hypocalcemia   Acute kidney injury ([place])   Hypophosphatemia   Abdominal pain   Colitis   Sepsis from E. Coli UTI/bacteremia - From urinary source Restarted IV antibiotics due to persistent fever and high white count, placed on meropenem CT scan abdomen pelvis to rule out pelvic/perirenal abscess, shows nonobstructive stones but no definite abcess  Anemia -Stable - low iron, low TIBC, MCV <80 - could be anemia of chronic disease vs sepsis - hemoccult ordered and is negative - will repeat CBC in am  Acute kidney injury ([place]) baseline normal 0.43, presented with a creatinine of 2.49. Now 2.77 - nephrology following for several days - concern for fluid overload. On Lasix 120 mg IV every 8,  Defer management of diuretics to nephrology - UOP of 2575 ml/24 hr two days ago - continue to monitor urine output No significant improvement in creatinine since admission    Diarrhea -CT scan reported possible colitis.No symptoms of diarrhea - diarrhea has resolved, abdomen is nontender    Diabetes ([place]). Hemoglobin A1c 10.9 on 04/29/16 - CBG's still uncontrolled - continue SSI and order novolog 3 units w/ meals if patient eats >50% of meals - patient on glipizide outpatient. Metformin on hold Started  patient on NPH 70/30 , 10 units twice a day     Essential hypertension - blood pressures reasonably controlled  on PO metoprolol    GERD - Placed on tums for calcium replacement but may be used for gerd    Hypokalemia - WNL this am  Hypomagnesemia, Replete - will repeat lab tomorrow - continue mag tab daily    Hypocalcemia-this needs to be repleted, continue telemetry - continue to monitor - continue tums   DVT prophylaxis: SCDs Code Status: DNR Family Communication: no family bedside this am Disposition Plan: 3-4 days   Consultants:   Nephrology  Procedures:   None  Antimicrobials:   Rocephin 12/20>12/22  Zosyn 12/22> 12/24  Ceftin 12/24>   Subjective:  Continues to feel bad, low-grade fever, white count continues to be elevated   Objective: Vitals:   06/16/16 0533 06/16/16 1356 06/16/16 2201 06/17/16 0443  BP: 140/65 (!) 136/58 136/61 (!) 129/49  Pulse: 95 94 100 95  Resp: 20 20 18 16   Temp: 98 F (36.7 C) 99.7 F (37.6 C) 99.9 F (37.7 C) 98.4 F (36.9 C)  TempSrc: Oral Oral Oral Oral  SpO2: 95% 94% 93% 93%  Weight:    37.2 kg (82 lb 0.2 oz)  Height:        Intake/Output Summary (Last 24 hours) at 06/17/16 0815 Last data filed at 06/17/16 0448  Gross per 24 hour  Intake              720 ml  Output             2575 ml  Net            -1855 ml   [employer]  06/11/16 2119 06/12/16 2300 06/17/16 0443  Weight: 37 kg (81 lb 9.1 oz) 44.2 kg (97 lb 7.1 oz) 37.2 kg (82 lb 0.2 oz)    Examination:  General exam: Appears calm and comfortable  Respiratory system: Clear to auscultation. Respiratory effort normal. Cardiovascular system: S1 & S2 heard, RRR. No JVD, murmurs, rubs, gallops or clicks. No pedal edema. Gastrointestinal system: Abdomen is nondistended, soft and nontender. No organomegaly or masses felt. Normal bowel sounds heard. Central nervous system: Alert and oriented. No focal neurological deficits. Extremities: Symmetric 5 x 5 power. Skin: No rashes, lesions or ulcers Psychiatry: Judgement and insight appear normal. Mood & affect  appropriate.     Data Reviewed: I have personally reviewed following labs and imaging studies  CBC:  Recent Labs Lab 06/11/16 1425 06/12/16 0136 06/12/16 0744 06/12/16 2038 06/13/16 0348 06/13/16 0843 06/15/16 0826  WBC 27.3* 22.4* 24.1* 24.3* 23.0*  --  18.8*  NEUTROABS 24.6*  --   --  22.6* 21.3*  --  16.3*  HGB 7.6* 7.0* 7.8* 7.3* 6.6* 7.1* 12.1  HCT 22.4* 20.4* 23.4* 22.0* 19.9* 21.2* 35.2*  MCV 78.6 78.8 81.0 78.6 79.6  --  79.5  PLT 566* 480* 568* 624* 498*  --  527   Basic Metabolic Panel:  Recent Labs Lab 06/11/16 1425  06/12/16 0744 06/12/16 2038 06/13/16 0348 06/14/16 0400 06/15/16 0826 06/16/16 0430  NA 131*  < > 138 139 137 141 139 137  K 2.1*  < > 3.1* 3.9 3.5 3.6 3.4* 4.2  CL 90*  < > 104 104 108 112* 102 103  CO2 25  < > 23 21* 17* 18* 22 22  GLUCOSE 279*  < > 118* 301* 251* 254* 269* 196*  BUN 43*  < > 37* 37* 34* 42* 46* 48*  CREATININE 2.49*  < > 2.28* 2.53* 2.33* 2.52* 2.78* 2.77*  CALCIUM 6.6*  < > 7.3* 7.0* 6.2* 6.2* 6.8* 7.2*  MG 1.1*  --  3.2* 2.3 1.8  --   --   --   PHOS 2.3*  --   --   --   --   --   --  3.4  < > = values in this interval not displayed. GFR: Estimated Creatinine Clearance: 11.9 mL/min (by C-G formula based on SCr of 2.77 mg/dL (H)). Liver Function Tests:  Recent Labs Lab 06/11/16 1425 06/12/16 0136 06/16/16 0430  AST 22 16  --   ALT 11* 9*  --   ALKPHOS 68 62  --   BILITOT 0.5 0.7  --   PROT 6.4* 5.1*  --   ALBUMIN 2.0* 1.7* 1.9*   No results for input(s): LIPASE, AMYLASE in the last 168 hours. No results for input(s): AMMONIA in the last 168 hours. Coagulation Profile: No results for input(s): INR, PROTIME in the last 168 hours. Cardiac Enzymes:  Recent Labs Lab 06/11/16 1425  TROPONINI <0.03   BNP (last 3 results) No results for input(s): PROBNP in the last 8760 hours. HbA1C: No results for input(s): HGBA1C in the last 72 hours. CBG:  Recent Labs Lab 06/16/16 0745 06/16/16 1139 06/16/16 1646  06/16/16 2159 06/17/16 0732  GLUCAP 208* 284* 354* 190* 272*   Lipid Profile: No results for input(s): CHOL, HDL, LDLCALC, TRIG, CHOLHDL, LDLDIRECT in the last 72 hours. Thyroid Function Tests: No results for input(s): TSH, T4TOTAL, FREET4, T3FREE, THYROIDAB in the last 72 hours. Anemia Panel:  Recent Labs  06/14/16 1525  FERRITIN 423*  TIBC 175*  IRON 7*   Sepsis Labs:  Recent Labs Lab 06/12/16 0744 06/12/16 2038 06/13/16 0054 06/13/16 0348  LATICACIDVEN 1.1 4.1* 1.9 1.5    Recent Results (from the past 240 hour(s))  Urine culture     Status: Abnormal   Collection Time: 06/11/16  2:33 PM  Result Value Ref Range Status   Specimen Description URINE, CLEAN CATCH  Final   Special Requests NONE  Final   Culture >=100,000 COLONIES/mL ESCHERICHIA COLI (A)  Final   Report Status 06/14/2016 FINAL  Final   Organism ID, Bacteria ESCHERICHIA COLI (A)  Final      Susceptibility   Escherichia coli - MIC*    AMPICILLIN >=32 RESISTANT Resistant     CEFAZOLIN <=4 SENSITIVE Sensitive     CEFTRIAXONE <=1 SENSITIVE Sensitive     CIPROFLOXACIN <=0.25 SENSITIVE Sensitive     GENTAMICIN <=1 SENSITIVE Sensitive     IMIPENEM <=0.25 SENSITIVE Sensitive     NITROFURANTOIN <=16 SENSITIVE Sensitive     TRIMETH/SULFA <=20 SENSITIVE Sensitive     AMPICILLIN/SULBACTAM 16 INTERMEDIATE Intermediate     PIP/TAZO <=4 SENSITIVE Sensitive     Extended ESBL NEGATIVE Sensitive     * >=100,000 COLONIES/mL ESCHERICHIA COLI  MRSA PCR Screening     Status: None   Collection Time: 06/11/16  8:49 PM  Result Value Ref Range Status   MRSA by PCR NEGATIVE NEGATIVE Final    Comment:        The GeneXpert MRSA Assay (FDA approved for NASAL specimens only), is one component of a comprehensive MRSA colonization surveillance program. It is not intended to diagnose MRSA infection nor to guide or monitor treatment for MRSA infections.   Culture, blood (routine x 2)     Status: Abnormal   Collection Time:  06/12/16  1:36 AM  Result Value Ref Range Status   Specimen Description BLOOD RIGHT ANTECUBITAL  Final   Special Requests IN PEDIATRIC BOTTLE 3ML  Final   Culture  Setup Time   Final    GRAM NEGATIVE RODS AEROBIC BOTTLE ONLY CRITICAL RESULT CALLED TO, READ BACK BY AND VERIFIED WITHKeturah Barre [facility] PHARMD 1856 06/12/16 A BROWNING Performed at [facility] (A)  Final   Report Status 06/14/2016 FINAL  Final   Organism ID, Bacteria ESCHERICHIA COLI  Final      Susceptibility   Escherichia coli - MIC*    AMPICILLIN >=32 RESISTANT Resistant     CEFAZOLIN <=4 SENSITIVE Sensitive     CEFEPIME <=1 SENSITIVE Sensitive     CEFTAZIDIME <=1 SENSITIVE Sensitive     CEFTRIAXONE <=1 SENSITIVE Sensitive     CIPROFLOXACIN <=0.25 SENSITIVE Sensitive     GENTAMICIN <=1 SENSITIVE Sensitive     IMIPENEM <=0.25 SENSITIVE Sensitive     TRIMETH/SULFA <=20 SENSITIVE Sensitive     AMPICILLIN/SULBACTAM 16 INTERMEDIATE Intermediate     PIP/TAZO <=4 SENSITIVE Sensitive     Extended ESBL NEGATIVE Sensitive     * ESCHERICHIA COLI  Culture, blood (routine x 2)     Status: Abnormal   Collection Time: 06/12/16  1:36 AM  Result Value Ref Range Status   Specimen Description BLOOD RIGHT HAND  Final   Special Requests IN PEDIATRIC BOTTLE 2ML  Final   Culture  Setup Time   Final    GRAM NEGATIVE RODS AEROBIC BOTTLE ONLY CRITICAL RESULT CALLED TO, READ BACK BY AND VERIFIED WITHKeturah Barre [facility] PHARMD 1840 06/12/16 A BROWNING    Culture (  A)  Final    ESCHERICHIA COLI SUSCEPTIBILITIES PERFORMED ON PREVIOUS CULTURE WITHIN THE LAST 5 DAYS. Performed at [facility]    Report Status 06/14/2016 FINAL  Final  Blood Culture ID Panel (Reflexed)     Status: Abnormal   Collection Time: 06/12/16  1:36 AM  Result Value Ref Range Status   Enterococcus species NOT DETECTED NOT DETECTED Final   Listeria monocytogenes NOT DETECTED NOT DETECTED Final   Staphylococcus species NOT DETECTED NOT  DETECTED Final   Staphylococcus aureus NOT DETECTED NOT DETECTED Final   Streptococcus species NOT DETECTED NOT DETECTED Final   Streptococcus agalactiae NOT DETECTED NOT DETECTED Final   Streptococcus pneumoniae NOT DETECTED NOT DETECTED Final   Streptococcus pyogenes NOT DETECTED NOT DETECTED Final   Acinetobacter baumannii NOT DETECTED NOT DETECTED Final   Enterobacteriaceae species DETECTED (A) NOT DETECTED Final    Comment: CRITICAL RESULT CALLED TO, READ BACK BY AND VERIFIED WITH: D [place] PHARMD 1840 06/12/16 A BROWNING    Enterobacter cloacae complex NOT DETECTED NOT DETECTED Final   Escherichia coli DETECTED (A) NOT DETECTED Final    Comment: CRITICAL RESULT CALLED TO, READ BACK BY AND VERIFIED WITH: D [facility] PHARMD 1840 06/12/16 A BROWNING    Klebsiella oxytoca NOT DETECTED NOT DETECTED Final   Klebsiella pneumoniae NOT DETECTED NOT DETECTED Final   Proteus species NOT DETECTED NOT DETECTED Final   Serratia marcescens NOT DETECTED NOT DETECTED Final   Carbapenem resistance NOT DETECTED NOT DETECTED Final   Haemophilus influenzae NOT DETECTED NOT DETECTED Final   Neisseria meningitidis NOT DETECTED NOT DETECTED Final   Pseudomonas aeruginosa NOT DETECTED NOT DETECTED Final   Candida albicans NOT DETECTED NOT DETECTED Final   Candida glabrata NOT DETECTED NOT DETECTED Final   Candida krusei NOT DETECTED NOT DETECTED Final   Candida parapsilosis NOT DETECTED NOT DETECTED Final   Candida tropicalis NOT DETECTED NOT DETECTED Final    Comment: Performed at [facility]         Radiology Studies: No results found.      Scheduled Meds: . atorvastatin  20 mg Oral Daily  . calcium carbonate  1 tablet Oral BID WC  . cefUROXime  500 mg Oral Q lunch  . ferumoxytol  510 mg Intravenous Weekly  . insulin aspart  0-15 Units Subcutaneous TID WC  . insulin aspart  0-5 Units Subcutaneous QHS  . insulin aspart  3 Units Subcutaneous TID WC  . magnesium chloride  1  tablet Oral Daily  . metoprolol tartrate  25 mg Oral BID  . multivitamin with minerals  1 tablet Oral Daily  . sodium chloride flush  3 mL Intravenous Q12H   Continuous Infusions:    LOS: 6 days    Time spent: 30 minutes    Heavenlee Maiorana, MD [facility]    If 7PM-7AM, please contact night-coverage [email or web address] Password [email or web address] 06/17/2016, 8:15 AM

## 2016-06-17 NOTE — Evaluation (Signed)
Occupational Therapy Evaluation Patient Details Name: [name] Michael MRN: [id number] DOB: 03-Dec-1960 Today's Date: 06/17/2016    History of Present Illness [name] is a 55 y.o. female with medical history significant of diabetes mellitus, hyperlipidemia, hypertension, stroke. Patient is a poor historian but was adm with diarrhea, nausea and vomiting, and weakness.   Clinical Impression  Pt admitted with weakness and not being able to perform her ADL activity . Pt currently with functional limitations due to the deficits listed below (see OT Problem List). Pt will benefit from skilled OT to increase their safety and independence with ADL and functional mobility for ADL to facilitate discharge to venue listed below.    Follow Up Recommendations  SNF    Equipment Recommendations  None recommended by OT       Precautions / Restrictions Precautions Precautions: Fall Restrictions Weight Bearing Restrictions: No      Mobility Bed Mobility   Bed Mobility: Supine to Sit     Supine to sit: Mod assist;HOB elevated     General bed mobility comments: Vc for technique  Transfers Overall transfer level: Needs assistance Equipment used: 1 person hand held assist Transfers: Sit to/from [facility] Sit to Stand: Mod assist Stand pivot transfers: Mod assist       General transfer comment: pts legs wobbly upon standing- pt stated- I am so weak but I am glad to be up!         ADL Overall ADL's : Needs assistance/impaired Eating/Feeding: Set up;Sitting   Grooming: Wash/dry face;Wash/dry hands;Set up;Sitting                   Toilet Transfer: Moderate assistance;BSC;Cueing for safety;Cueing for sequencing;Stand-pivot   Toileting- Clothing Manipulation and Hygiene: Maximal assistance;Sit to/from stand;Cueing for safety;Cueing for sequencing         General ADL Comments: pts legs very unsteady with sit to stand as well as a stand pivot  transfer.               Pertinent Vitals/Pain Pain Assessment: No/denies pain        Extremity/Trunk Assessment Upper Extremity Assessment Upper Extremity Assessment: Generalized weakness           Communication Communication Communication: No difficulties   Cognition Arousal/Alertness: Awake/alert Behavior During Therapy: Flat affect Overall Cognitive Status: No family/caregiver present to determine baseline cognitive functioning Area of Impairment: Problem solving;Safety/judgement             Problem Solving: Slow processing;Requires verbal cues     General Comments   pt pleased she was able to get to chair but recognized she needed significant A            Home Living Family/patient expects to be discharged to:: Private residence Living Arrangements: Other relatives (sister) Available Help at Discharge: Family Type of Home: House Home Access: Stairs to enter Technical brewer of Steps: 4   Home Layout: One level     Bathroom Shower/Tub: Risk analyst characteristics: Door       Home Equipment: None   Additional Comments: pt did her own laundry, sister does meals      Prior Functioning/Environment Level of Independence: Independent                 OT Problem List: Decreased strength;Decreased activity tolerance;Decreased knowledge of use of DME or AE   OT Treatment/Interventions: Self-care/ADL training;DME and/or AE instruction;Patient/family education    OT Goals(Current goals can be found in  the care plan section) Acute Rehab OT Goals Patient Stated Goal: pt aware she is not able to take care of her self at this time and will need rehab OT Goal Formulation: With patient Time For Goal Achievement: 07/01/16  OT Frequency: Min 2X/week              End of Session Nurse Communication: Mobility status  Activity Tolerance: Patient tolerated treatment well Patient left: in chair;with call bell/phone within  reach;with chair alarm set;with nursing/sitter in room   Time: 1587-2761 OT Time Calculation (min): 19 min Charges:  OT General Charges $OT Visit: 1 Procedure OT Evaluation $OT Eval Moderate Complexity: 1 Procedure G-Codes:    Betsy Pries 2016/06/28, 12:57 PM

## 2016-06-17 NOTE — Progress Notes (Signed)
  Echocardiogram 2D Echocardiogram has been performed.  Tresa Res 06/17/2016, 2:38 PM

## 2016-06-18 DIAGNOSIS — I5030 Unspecified diastolic (congestive) heart failure: Secondary | ICD-10-CM

## 2016-06-18 DIAGNOSIS — I509 Heart failure, unspecified: Secondary | ICD-10-CM

## 2016-06-18 LAB — COMPREHENSIVE METABOLIC PANEL
ALBUMIN: 2.1 g/dL — AB (ref 3.5–5.0)
ALT: 17 U/L (ref 14–54)
AST: 17 U/L (ref 15–41)
Alkaline Phosphatase: 95 U/L (ref 38–126)
Anion gap: 9 (ref 5–15)
BUN: 49 mg/dL — AB (ref 6–20)
CHLORIDE: 103 mmol/L (ref 101–111)
CO2: 24 mmol/L (ref 22–32)
CREATININE: 2.7 mg/dL — AB (ref 0.44–1.00)
Calcium: 8.2 mg/dL — ABNORMAL LOW (ref 8.9–10.3)
GFR calc Af Amer: 22 mL/min — ABNORMAL LOW (ref >60)
GFR calc non Af Amer: 19 mL/min — ABNORMAL LOW (ref >60)
Glucose, Bld: 191 mg/dL — ABNORMAL HIGH (ref 65–99)
Potassium: 4.2 mmol/L (ref 3.5–5.1)
SODIUM: 136 mmol/L (ref 135–145)
Total Bilirubin: 0.6 mg/dL (ref 0.3–1.2)
Total Protein: 6.6 g/dL (ref 6.5–8.1)

## 2016-06-18 LAB — GLUCOSE, CAPILLARY
Glucose-Capillary: 201 mg/dL — ABNORMAL HIGH (ref 65–99)
Glucose-Capillary: 296 mg/dL — ABNORMAL HIGH (ref 65–99)
Glucose-Capillary: 219 mg/dL — ABNORMAL HIGH (ref 65–99)
Glucose-Capillary: 286 mg/dL — ABNORMAL HIGH (ref 65–99)

## 2016-06-18 LAB — CBC
HCT: 33.5 % — ABNORMAL LOW (ref 36.0–46.0)
Hemoglobin: 11.1 g/dL — ABNORMAL LOW (ref 12.0–15.0)
MCH: 26.7 pg (ref 26.0–34.0)
MCHC: 33.1 g/dL (ref 30.0–36.0)
MCV: 80.7 fL (ref 78.0–100.0)
PLATELETS: 336 10*3/uL (ref 150–400)
RBC: 4.15 MIL/uL (ref 3.87–5.11)
RDW: 15.2 % (ref 11.5–15.5)
WBC: 20.5 10*3/uL — AB (ref 4.0–10.5)

## 2016-06-18 MED ORDER — INSULIN ASPART PROT & ASPART (70-30 MIX) 100 UNIT/ML ~~LOC~~ SUSP
12.0000 [IU] | Freq: Two times a day (BID) | SUBCUTANEOUS | Status: DC
  Administered 2016-06-18: 12 [IU] via SUBCUTANEOUS
  Filled 2016-06-18: qty 10

## 2016-06-18 NOTE — Consult Note (Signed)
Urology Consult  Referring physician: Derrek Gu Reason for referral: Pyelonephritis  Chief Complaint: Pyelonephritis  History of Present Illness: Asked to assess patient with pyelo who is not responding rapidly to iv antibiotics; medical co-morbidities; nausea and vomiting and weak; had urine and blood c/s pos for E coli; base Cr is .4; WBC still 20.5 and Cr 2.7;  CT scan: bilateral small nonobstr. Stones and finding suggestive of pyelo; no obstruction; kidneys are impressively swollen  Denies previous stones/UTI/Kidney infections No acute cystitis symptoms  Modifying factors: There are no other modifying factors  Associated signs and symptoms: There are no other associated signs and symptoms Aggravating and relieving factors: There are no other aggravating or relieving factors Severity: Moderate Duration: Persistent  Past Medical History:  Diagnosis Date  . Diabetes mellitus   . Hyperlipidemia   . Hypertension   . Stroke [facility]) 04-01-11   left frontal subcortical, saw Dr. [name]   . TIA (transient ischemic attack) 03-12-11   Past Surgical History:  Procedure Laterality Date  . OPEN REDUCTION INTERNAL FIXATION (ORIF) DISTAL RADIAL FRACTURE Left 01/28/2016   Procedure: OPEN REDUCTION INTERNAL FIXATION (ORIF) DISTAL RADIAL FRACTURE;  Surgeon: Iran Planas, MD;  Location: [place];  Service: Orthopedics;  Laterality: Left;  . [place]     age 55    Medications: I have reviewed the patient's current medications. Allergies:  Allergies  Allergen Reactions  . Hydrocodone Nausea And Vomiting    Family History  Problem Relation Age of Onset  . Stroke Mother   . Diabetes Mother   . Kidney failure Mother   . Heart failure Mother   . Stroke Father   . Cancer Sister     Breast- 50's   Social History:  reports that she has been smoking Cigarettes.  She has a 32.00 pack-year smoking history. She has never used smokeless  tobacco. She reports that she does not drink alcohol or use drugs.  ROS: All systems are reviewed and negative except as noted. Rest negative  Physical Exam:  Vital signs in last 24 hours: Temp:  [98.3 F (36.8 C)-98.6 F (37 C)] 98.3 F (36.8 C) (12/27 6283) Pulse Rate:  [100-105] 100 (12/27 0611) Resp:  [16-18] 16 (12/27 0611) BP: (151-152)/(76-78) 151/78 (12/27 0611) SpO2:  [95 %] 95 % (12/27 0611) Weight:  [35.6 kg (78 lb 7.7 oz)] 35.6 kg (78 lb 7.7 oz) (12/27 1517)  Cardiovascular: Skin warm; not flushed Respiratory: Breaths quiet; no shortness of breath Abdomen: No masses Neurological: Normal sensation to touch Musculoskeletal: Normal motor function arms and legs Lymphatics: No inguinal adenopathy Skin: No rashes Genitourinary:no abdominal tenderness  Laboratory Data:  Results for orders placed or performed during the hospital encounter of 06/11/16 (from the past 72 hour(s))  Glucose, capillary     Status: Abnormal   Collection Time: 06/15/16  3:54 PM  Result Value Ref Range   Glucose-Capillary 239 (H) 65 - 99 mg/dL   Comment 1 Notify RN    Comment 2 Document in Chart   Glucose, capillary     Status: Abnormal   Collection Time: 06/15/16  9:03 PM  Result Value Ref Range   Glucose-Capillary 231 (H) 65 - 99 mg/dL  Renal function panel     Status: Abnormal   Collection Time: 06/16/16  4:30 AM  Result Value Ref Range   Sodium 137 135 - 145 mmol/L   Potassium 4.2 3.5 - 5.1 mmol/L  Comment: DELTA CHECK NOTED MODERATE HEMOLYSIS    Chloride 103 101 - 111 mmol/L   CO2 22 22 - 32 mmol/L   Glucose, Bld 196 (H) 65 - 99 mg/dL   BUN 48 (H) 6 - 20 mg/dL   Creatinine, Ser 2.77 (H) 0.44 - 1.00 mg/dL   Calcium 7.2 (L) 8.9 - 10.3 mg/dL   Phosphorus 3.4 2.5 - 4.6 mg/dL   Albumin 1.9 (L) 3.5 - 5.0 g/dL   GFR calc non Af Amer 18 (L) >60 mL/min   GFR calc Af Amer 21 (L) >60 mL/min    Comment: (NOTE) The eGFR has been calculated using the CKD EPI equation. This calculation  has not been validated in all clinical situations. eGFR's persistently <60 mL/min signify possible Chronic Kidney Disease.    Anion gap 12 5 - 15  Glucose, capillary     Status: Abnormal   Collection Time: 06/16/16  7:45 AM  Result Value Ref Range   Glucose-Capillary 208 (H) 65 - 99 mg/dL  Glucose, capillary     Status: Abnormal   Collection Time: 06/16/16 11:39 AM  Result Value Ref Range   Glucose-Capillary 284 (H) 65 - 99 mg/dL  Glucose, capillary     Status: Abnormal   Collection Time: 06/16/16  4:46 PM  Result Value Ref Range   Glucose-Capillary 354 (H) 65 - 99 mg/dL  Glucose, capillary     Status: Abnormal   Collection Time: 06/16/16  9:59 PM  Result Value Ref Range   Glucose-Capillary 190 (H) 65 - 99 mg/dL   Comment 1 Notify RN   Glucose, capillary     Status: Abnormal   Collection Time: 06/17/16  7:32 AM  Result Value Ref Range   Glucose-Capillary 272 (H) 65 - 99 mg/dL  Basic metabolic panel     Status: Abnormal   Collection Time: 06/17/16  8:16 AM  Result Value Ref Range   Sodium 136 135 - 145 mmol/L   Potassium 3.1 (L) 3.5 - 5.1 mmol/L    Comment: DELTA CHECK NOTED REPEATED TO VERIFY    Chloride 100 (L) 101 - 111 mmol/L   CO2 24 22 - 32 mmol/L   Glucose, Bld 347 (H) 65 - 99 mg/dL   BUN 48 (H) 6 - 20 mg/dL   Creatinine, Ser 2.74 (H) 0.44 - 1.00 mg/dL   Calcium 7.8 (L) 8.9 - 10.3 mg/dL   GFR calc non Af Amer 18 (L) >60 mL/min   GFR calc Af Amer 21 (L) >60 mL/min    Comment: (NOTE) The eGFR has been calculated using the CKD EPI equation. This calculation has not been validated in all clinical situations. eGFR's persistently <60 mL/min signify possible Chronic Kidney Disease.    Anion gap 12 5 - 15  CBC     Status: Abnormal   Collection Time: 06/17/16  8:18 AM  Result Value Ref Range   WBC 19.9 (H) 4.0 - 10.5 K/uL   RBC 4.28 3.87 - 5.11 MIL/uL   Hemoglobin 11.5 (L) 12.0 - 15.0 g/dL   HCT 34.0 (L) 36.0 - 46.0 %   MCV 79.4 78.0 - 100.0 fL   MCH 26.9 26.0 -  34.0 pg   MCHC 33.8 30.0 - 36.0 g/dL   RDW 15.2 11.5 - 15.5 %   Platelets 301 150 - 400 K/uL  Magnesium     Status: Abnormal   Collection Time: 06/17/16  8:18 AM  Result Value Ref Range   Magnesium 1.6 (L) 1.7 - 2.4  mg/dL  Glucose, capillary     Status: Abnormal   Collection Time: 06/17/16 12:12 PM  Result Value Ref Range   Glucose-Capillary 321 (H) 65 - 99 mg/dL  Glucose, capillary     Status: Abnormal   Collection Time: 06/17/16  5:12 PM  Result Value Ref Range   Glucose-Capillary 140 (H) 65 - 99 mg/dL  Glucose, capillary     Status: Abnormal   Collection Time: 06/17/16  8:52 PM  Result Value Ref Range   Glucose-Capillary 218 (H) 65 - 99 mg/dL  CBC     Status: Abnormal   Collection Time: 06/18/16  4:51 AM  Result Value Ref Range   WBC 20.5 (H) 4.0 - 10.5 K/uL   RBC 4.15 3.87 - 5.11 MIL/uL   Hemoglobin 11.1 (L) 12.0 - 15.0 g/dL   HCT 33.5 (L) 36.0 - 46.0 %   MCV 80.7 78.0 - 100.0 fL   MCH 26.7 26.0 - 34.0 pg   MCHC 33.1 30.0 - 36.0 g/dL   RDW 15.2 11.5 - 15.5 %   Platelets 336 150 - 400 K/uL  Comprehensive metabolic panel     Status: Abnormal   Collection Time: 06/18/16  4:51 AM  Result Value Ref Range   Sodium 136 135 - 145 mmol/L   Potassium 4.2 3.5 - 5.1 mmol/L    Comment: DELTA CHECK NOTED NO VISIBLE HEMOLYSIS    Chloride 103 101 - 111 mmol/L   CO2 24 22 - 32 mmol/L   Glucose, Bld 191 (H) 65 - 99 mg/dL   BUN 49 (H) 6 - 20 mg/dL   Creatinine, Ser 2.70 (H) 0.44 - 1.00 mg/dL   Calcium 8.2 (L) 8.9 - 10.3 mg/dL   Total Protein 6.6 6.5 - 8.1 g/dL   Albumin 2.1 (L) 3.5 - 5.0 g/dL   AST 17 15 - 41 U/L   ALT 17 14 - 54 U/L   Alkaline Phosphatase 95 38 - 126 U/L   Total Bilirubin 0.6 0.3 - 1.2 mg/dL   GFR calc non Af Amer 19 (L) >60 mL/min   GFR calc Af Amer 22 (L) >60 mL/min    Comment: (NOTE) The eGFR has been calculated using the CKD EPI equation. This calculation has not been validated in all clinical situations. eGFR's persistently <60 mL/min signify possible  Chronic Kidney Disease.    Anion gap 9 5 - 15  Glucose, capillary     Status: Abnormal   Collection Time: 06/18/16  7:41 AM  Result Value Ref Range   Glucose-Capillary 201 (H) 65 - 99 mg/dL  Glucose, capillary     Status: Abnormal   Collection Time: 06/18/16 11:48 AM  Result Value Ref Range   Glucose-Capillary 296 (H) 65 - 99 mg/dL   Recent Results (from the past 240 hour(s))  Urine culture     Status: Abnormal   Collection Time: 06/11/16  2:33 PM  Result Value Ref Range Status   Specimen Description URINE, CLEAN CATCH  Final   Special Requests NONE  Final   Culture >=100,000 COLONIES/mL ESCHERICHIA COLI (A)  Final   Report Status 06/14/2016 FINAL  Final   Organism ID, Bacteria ESCHERICHIA COLI (A)  Final      Susceptibility   Escherichia coli - MIC*    AMPICILLIN >=32 RESISTANT Resistant     CEFAZOLIN <=4 SENSITIVE Sensitive     CEFTRIAXONE <=1 SENSITIVE Sensitive     CIPROFLOXACIN <=0.25 SENSITIVE Sensitive     GENTAMICIN <=1 SENSITIVE Sensitive  IMIPENEM <=0.25 SENSITIVE Sensitive     NITROFURANTOIN <=16 SENSITIVE Sensitive     TRIMETH/SULFA <=20 SENSITIVE Sensitive     AMPICILLIN/SULBACTAM 16 INTERMEDIATE Intermediate     PIP/TAZO <=4 SENSITIVE Sensitive     Extended ESBL NEGATIVE Sensitive     * >=100,000 COLONIES/mL ESCHERICHIA COLI  MRSA PCR Screening     Status: None   Collection Time: 06/11/16  8:49 PM  Result Value Ref Range Status   MRSA by PCR NEGATIVE NEGATIVE Final    Comment:        The GeneXpert MRSA Assay (FDA approved for NASAL specimens only), is one component of a comprehensive MRSA colonization surveillance program. It is not intended to diagnose MRSA infection nor to guide or monitor treatment for MRSA infections.   Culture, blood (routine x 2)     Status: Abnormal   Collection Time: 06/12/16  1:36 AM  Result Value Ref Range Status   Specimen Description BLOOD RIGHT ANTECUBITAL  Final   Special Requests IN PEDIATRIC BOTTLE 3ML  Final    Culture  Setup Time   Final    GRAM NEGATIVE RODS AEROBIC BOTTLE ONLY CRITICAL RESULT CALLED TO, READ BACK BY AND VERIFIED WITHKeturah Barre [facility] PHARMD 5035 06/12/16 A BROWNING Performed at [place] (A)  Final   Report Status 06/14/2016 FINAL  Final   Organism ID, Bacteria ESCHERICHIA COLI  Final      Susceptibility   Escherichia coli - MIC*    AMPICILLIN >=32 RESISTANT Resistant     CEFAZOLIN <=4 SENSITIVE Sensitive     CEFEPIME <=1 SENSITIVE Sensitive     CEFTAZIDIME <=1 SENSITIVE Sensitive     CEFTRIAXONE <=1 SENSITIVE Sensitive     CIPROFLOXACIN <=0.25 SENSITIVE Sensitive     GENTAMICIN <=1 SENSITIVE Sensitive     IMIPENEM <=0.25 SENSITIVE Sensitive     TRIMETH/SULFA <=20 SENSITIVE Sensitive     AMPICILLIN/SULBACTAM 16 INTERMEDIATE Intermediate     PIP/TAZO <=4 SENSITIVE Sensitive     Extended ESBL NEGATIVE Sensitive     * ESCHERICHIA COLI  Culture, blood (routine x 2)     Status: Abnormal   Collection Time: 06/12/16  1:36 AM  Result Value Ref Range Status   Specimen Description BLOOD RIGHT HAND  Final   Special Requests IN PEDIATRIC BOTTLE 2ML  Final   Culture  Setup Time   Final    GRAM NEGATIVE RODS AEROBIC BOTTLE ONLY CRITICAL RESULT CALLED TO, READ BACK BY AND VERIFIED WITH: D [employer] PHARMD 1840 06/12/16 A BROWNING    Culture (A)  Final    ESCHERICHIA COLI SUSCEPTIBILITIES PERFORMED ON PREVIOUS CULTURE WITHIN THE LAST 5 DAYS. Performed at [facility]    Report Status 06/14/2016 FINAL  Final  Blood Culture ID Panel (Reflexed)     Status: Abnormal   Collection Time: 06/12/16  1:36 AM  Result Value Ref Range Status   Enterococcus species NOT DETECTED NOT DETECTED Final   Listeria monocytogenes NOT DETECTED NOT DETECTED Final   Staphylococcus species NOT DETECTED NOT DETECTED Final   Staphylococcus aureus NOT DETECTED NOT DETECTED Final   Streptococcus species NOT DETECTED NOT DETECTED Final   Streptococcus agalactiae NOT  DETECTED NOT DETECTED Final   Streptococcus pneumoniae NOT DETECTED NOT DETECTED Final   Streptococcus pyogenes NOT DETECTED NOT DETECTED Final   Acinetobacter baumannii NOT DETECTED NOT DETECTED Final   Enterobacteriaceae species DETECTED (A) NOT DETECTED Final    Comment: CRITICAL RESULT CALLED  TO, READ BACK BY AND VERIFIED WITH: D [place] PHARMD 1840 06/12/16 A BROWNING    Enterobacter cloacae complex NOT DETECTED NOT DETECTED Final   Escherichia coli DETECTED (A) NOT DETECTED Final    Comment: CRITICAL RESULT CALLED TO, READ BACK BY AND VERIFIED WITH: D [facility] PHARMD 1840 06/12/16 A BROWNING    Klebsiella oxytoca NOT DETECTED NOT DETECTED Final   Klebsiella pneumoniae NOT DETECTED NOT DETECTED Final   Proteus species NOT DETECTED NOT DETECTED Final   Serratia marcescens NOT DETECTED NOT DETECTED Final   Carbapenem resistance NOT DETECTED NOT DETECTED Final   Haemophilus influenzae NOT DETECTED NOT DETECTED Final   Neisseria meningitidis NOT DETECTED NOT DETECTED Final   Pseudomonas aeruginosa NOT DETECTED NOT DETECTED Final   Candida albicans NOT DETECTED NOT DETECTED Final   Candida glabrata NOT DETECTED NOT DETECTED Final   Candida krusei NOT DETECTED NOT DETECTED Final   Candida parapsilosis NOT DETECTED NOT DETECTED Final   Candida tropicalis NOT DETECTED NOT DETECTED Final    Comment: Performed at [facility]   Creatinine:  Recent Labs  06/12/16 2038 06/13/16 0348 06/14/16 0400 06/15/16 0826 06/16/16 0430 06/17/16 0816 06/18/16 0451  CREATININE 2.53* 2.33* 2.52* 2.78* 2.77* 2.74* 2.70*    Xrays: See report/chart   Impression/Assessment:  Agree with pyelo diagnosis  Plan:  No distal obstructing stones No urology intervention or f/up needed  Please let me know if anything changes and I will gladly reasess   Delsin Copen A 06/18/2016, 2:53 PM

## 2016-06-18 NOTE — Progress Notes (Signed)
PROGRESS NOTE    [name] Michael  WUJ:[id number] DOB: 03/09/1961 DOA: 06/11/2016 PCP: Arnoldo Morale, MD    Brief Narrative:  55 y.o.femalewith medical history significant of diabetes mellitus, hyperlipidemia, hypertension, stroke. Patient is a poor historian but states that for several days she has been having diarrhea, nausea and vomiting, and weakness. Presented to the ED with UTI, hypokalemia, hypocalcemia, hypophosphatemia, hypomagnesemia, lactic acidosis. Found to have sepsis secondary to Escherichia coli bacteremia   Assessment & Plan:   Active Problems:   Diabetes ([place])   Essential hypertension   GERD   Hypokalemia   Hypocalcemia   Acute kidney injury ([place])   Hypophosphatemia   Abdominal pain   Colitis   Sepsis from E. Coli UTI/bacteremia From urinary source Restarted IV antibiotics due to persistent fever and high white count, placed on meropenem 12/26 CT scan abdomen pelvis to rule out pelvic/perirenal abscess, shows nonobstructive stones but no definite abcess White blood cell count still elevated urology consult for possible obstructive uropathy, since patient has not significantly improved over the course of last 7 days   Anemia -Stable - low iron, low TIBC, MCV <80 - could be anemia of chronic disease vs sepsis - hemoccult negative    Acute kidney injury ([place]) baseline normal 0.43, presented with a creatinine of 2.49. Now 2.77>2.7 - nephrology following for several days - concern for fluid overload. Was given Lasix 120 mg IV every 8,  Defer management of diuretics to nephrology, Lasix was discontinued 12/26 - UOP of 3200 ml/24 hr  , - continue to monitor urine output No significant improvement in creatinine since admission    Diarrhea, improving -CT scan reported possible colitis.No symptoms of diarrhea - diarrhea has resolved, abdomen is nontender      Diabetes ([place]). Hemoglobin A1c 10.9 on 04/29/16 - CBG's improved last 24 hours Holding  glipizide and metformin Increase  NPH 70/30 , 12 units twice a day     Essential hypertension - blood pressures reasonably controlled on PO metoprolol    GERD - Placed on tums for calcium replacement but may be used for gerd    Hypokalemia - WNL this am  Hypomagnesemia, Replete - will repeat lab tomorrow - continue mag tab daily    Hypocalcemia-this needs to be repleted, continue telemetry - continue to monitor - continue tums    DVT prophylaxis: SCDs Code Status: DNR Family Communication: no family bedside this am Disposition Plan: 3-4 days   Consultants:   Nephrology  Urology  Procedures:   None  Antimicrobials:   Rocephin 12/20>12/22  Zosyn 12/22> 12/24  Ceftin 12/24>   Subjective: Feels better today , afebrile for 24 hours   Objective: Vitals:   06/17/16 1113 06/17/16 1428 06/17/16 2055 06/18/16 0611  BP: 136/64 (!) 155/69 (!) 152/76 (!) 151/78  Pulse: 99 90 (!) 105 100  Resp:  17 18 16   Temp:  98.9 F (37.2 C) 98.6 F (37 C) 98.3 F (36.8 C)  TempSrc:  Oral Oral Oral  SpO2:  98% 95% 95%  Weight:    35.6 kg (78 lb 7.7 oz)  Height:        Intake/Output Summary (Last 24 hours) at 06/18/16 0759 Last data filed at 06/18/16 9562  Gross per 24 hour  Intake              770 ml  Output             3202 ml  Net            -  2432 ml   Filed Weights   06/12/16 2300 06/17/16 0443 06/18/16 0611  Weight: 44.2 kg (97 lb 7.1 oz) 37.2 kg (82 lb 0.2 oz) 35.6 kg (78 lb 7.7 oz)    Examination:  General exam: Appears calm and comfortable  Respiratory system: Clear to auscultation. Respiratory effort normal. Cardiovascular system: S1 & S2 heard, RRR. No JVD, murmurs, rubs, gallops or clicks. No pedal edema. Gastrointestinal system: Abdomen is nondistended, soft and nontender. No organomegaly or masses felt. Normal bowel sounds heard. Central nervous system: Alert and oriented. No focal neurological deficits. Extremities: Symmetric 5 x 5  power. Skin: No rashes, lesions or ulcers Psychiatry: Judgement and insight appear normal. Mood & affect appropriate.     Data Reviewed: I have personally reviewed following labs and imaging studies  CBC:  Recent Labs Lab 06/11/16 1425  06/12/16 2038 06/13/16 0348 06/13/16 0843 06/15/16 0826 06/17/16 0818 06/18/16 0451  WBC 27.3*  < > 24.3* 23.0*  --  18.8* 19.9* 20.5*  NEUTROABS 24.6*  --  22.6* 21.3*  --  16.3*  --   --   HGB 7.6*  < > 7.3* 6.6* 7.1* 12.1 11.5* 11.1*  HCT 22.4*  < > 22.0* 19.9* 21.2* 35.2* 34.0* 33.5*  MCV 78.6  < > 78.6 79.6  --  79.5 79.4 80.7  PLT 566*  < > 624* 498*  --  366 301 336  < > = values in this interval not displayed. Basic Metabolic Panel:  Recent Labs Lab 06/11/16 1425  06/12/16 0744 06/12/16 2038 06/13/16 0348 06/14/16 0400 06/15/16 0826 06/16/16 0430 06/17/16 0816 06/17/16 0818 06/18/16 0451  NA 131*  < > 138 139 137 141 139 137 136  --  136  K 2.1*  < > 3.1* 3.9 3.5 3.6 3.4* 4.2 3.1*  --  4.2  CL 90*  < > 104 104 108 112* 102 103 100*  --  103  CO2 25  < > 23 21* 17* 18* 22 22 24   --  24  GLUCOSE 279*  < > 118* 301* 251* 254* 269* 196* 347*  --  191*  BUN 43*  < > 37* 37* 34* 42* 46* 48* 48*  --  49*  CREATININE 2.49*  < > 2.28* 2.53* 2.33* 2.52* 2.78* 2.77* 2.74*  --  2.70*  CALCIUM 6.6*  < > 7.3* 7.0* 6.2* 6.2* 6.8* 7.2* 7.8*  --  8.2*  MG 1.1*  --  3.2* 2.3 1.8  --   --   --   --  1.6*  --   PHOS 2.3*  --   --   --   --   --   --  3.4  --   --   --   < > = values in this interval not displayed. GFR: Estimated Creatinine Clearance: 12.2 mL/min (by C-G formula based on SCr of 2.7 mg/dL (H)). Liver Function Tests:  Recent Labs Lab 06/11/16 1425 06/12/16 0136 06/16/16 0430 06/18/16 0451  AST 22 16  --  17  ALT 11* 9*  --  17  ALKPHOS 68 62  --  95  BILITOT 0.5 0.7  --  0.6  PROT 6.4* 5.1*  --  6.6  ALBUMIN 2.0* 1.7* 1.9* 2.1*   No results for input(s): LIPASE, AMYLASE in the last 168 hours. No results for  input(s): AMMONIA in the last 168 hours. Coagulation Profile: No results for input(s): INR, PROTIME in the last 168 hours. Cardiac Enzymes:  Recent Labs  Lab 06/11/16 1425  TROPONINI <0.03   BNP (last 3 results) No results for input(s): PROBNP in the last 8760 hours. HbA1C: No results for input(s): HGBA1C in the last 72 hours. CBG:  Recent Labs Lab 06/17/16 0732 06/17/16 1212 06/17/16 1712 06/17/16 2052 06/18/16 0741  GLUCAP 272* 321* 140* 218* 201*   Lipid Profile: No results for input(s): CHOL, HDL, LDLCALC, TRIG, CHOLHDL, LDLDIRECT in the last 72 hours. Thyroid Function Tests: No results for input(s): TSH, T4TOTAL, FREET4, T3FREE, THYROIDAB in the last 72 hours. Anemia Panel: No results for input(s): VITAMINB12, FOLATE, FERRITIN, TIBC, IRON, RETICCTPCT in the last 72 hours. Sepsis Labs:  Recent Labs Lab 06/12/16 0744 06/12/16 2038 06/13/16 0054 06/13/16 0348  LATICACIDVEN 1.1 4.1* 1.9 1.5    Recent Results (from the past 240 hour(s))  Urine culture     Status: Abnormal   Collection Time: 06/11/16  2:33 PM  Result Value Ref Range Status   Specimen Description URINE, CLEAN CATCH  Final   Special Requests NONE  Final   Culture >=100,000 COLONIES/mL ESCHERICHIA COLI (A)  Final   Report Status 06/14/2016 FINAL  Final   Organism ID, Bacteria ESCHERICHIA COLI (A)  Final      Susceptibility   Escherichia coli - MIC*    AMPICILLIN >=32 RESISTANT Resistant     CEFAZOLIN <=4 SENSITIVE Sensitive     CEFTRIAXONE <=1 SENSITIVE Sensitive     CIPROFLOXACIN <=0.25 SENSITIVE Sensitive     GENTAMICIN <=1 SENSITIVE Sensitive     IMIPENEM <=0.25 SENSITIVE Sensitive     NITROFURANTOIN <=16 SENSITIVE Sensitive     TRIMETH/SULFA <=20 SENSITIVE Sensitive     AMPICILLIN/SULBACTAM 16 INTERMEDIATE Intermediate     PIP/TAZO <=4 SENSITIVE Sensitive     Extended ESBL NEGATIVE Sensitive     * >=100,000 COLONIES/mL ESCHERICHIA COLI  MRSA PCR Screening     Status: None   Collection  Time: 06/11/16  8:49 PM  Result Value Ref Range Status   MRSA by PCR NEGATIVE NEGATIVE Final    Comment:        The GeneXpert MRSA Assay (FDA approved for NASAL specimens only), is one component of a comprehensive MRSA colonization surveillance program. It is not intended to diagnose MRSA infection nor to guide or monitor treatment for MRSA infections.   Culture, blood (routine x 2)     Status: Abnormal   Collection Time: 06/12/16  1:36 AM  Result Value Ref Range Status   Specimen Description BLOOD RIGHT ANTECUBITAL  Final   Special Requests IN PEDIATRIC BOTTLE 3ML  Final   Culture  Setup Time   Final    GRAM NEGATIVE RODS AEROBIC BOTTLE ONLY CRITICAL RESULT CALLED TO, READ BACK BY AND VERIFIED WITHKeturah Barre [facility] PHARMD [id number] 06/12/16 A BROWNING Performed at [facility] (A)  Final   Report Status 06/14/2016 FINAL  Final   Organism ID, Bacteria ESCHERICHIA COLI  Final      Susceptibility   Escherichia coli - MIC*    AMPICILLIN >=32 RESISTANT Resistant     CEFAZOLIN <=4 SENSITIVE Sensitive     CEFEPIME <=1 SENSITIVE Sensitive     CEFTAZIDIME <=1 SENSITIVE Sensitive     CEFTRIAXONE <=1 SENSITIVE Sensitive     CIPROFLOXACIN <=0.25 SENSITIVE Sensitive     GENTAMICIN <=1 SENSITIVE Sensitive     IMIPENEM <=0.25 SENSITIVE Sensitive     TRIMETH/SULFA <=20 SENSITIVE Sensitive     AMPICILLIN/SULBACTAM 16 INTERMEDIATE Intermediate  PIP/TAZO <=4 SENSITIVE Sensitive     Extended ESBL NEGATIVE Sensitive     * ESCHERICHIA COLI  Culture, blood (routine x 2)     Status: Abnormal   Collection Time: 06/12/16  1:36 AM  Result Value Ref Range Status   Specimen Description BLOOD RIGHT HAND  Final   Special Requests IN PEDIATRIC BOTTLE 2ML  Final   Culture  Setup Time   Final    GRAM NEGATIVE RODS AEROBIC BOTTLE ONLY CRITICAL RESULT CALLED TO, READ BACK BY AND VERIFIED WITH: D [facility] PHARMD [id number] 06/12/16 A BROWNING    Culture (A)  Final    ESCHERICHIA  COLI SUSCEPTIBILITIES PERFORMED ON PREVIOUS CULTURE WITHIN THE LAST 5 DAYS. Performed at [facility]    Report Status 06/14/2016 FINAL  Final  Blood Culture ID Panel (Reflexed)     Status: Abnormal   Collection Time: 06/12/16  1:36 AM  Result Value Ref Range Status   Enterococcus species NOT DETECTED NOT DETECTED Final   Listeria monocytogenes NOT DETECTED NOT DETECTED Final   Staphylococcus species NOT DETECTED NOT DETECTED Final   Staphylococcus aureus NOT DETECTED NOT DETECTED Final   Streptococcus species NOT DETECTED NOT DETECTED Final   Streptococcus agalactiae NOT DETECTED NOT DETECTED Final   Streptococcus pneumoniae NOT DETECTED NOT DETECTED Final   Streptococcus pyogenes NOT DETECTED NOT DETECTED Final   Acinetobacter baumannii NOT DETECTED NOT DETECTED Final   Enterobacteriaceae species DETECTED (A) NOT DETECTED Final    Comment: CRITICAL RESULT CALLED TO, READ BACK BY AND VERIFIED WITH: D [facility] PHARMD 1840 06/12/16 A BROWNING    Enterobacter cloacae complex NOT DETECTED NOT DETECTED Final   Escherichia coli DETECTED (A) NOT DETECTED Final    Comment: CRITICAL RESULT CALLED TO, READ BACK BY AND VERIFIED WITH: D [facility] PHARMD 1840 06/12/16 A BROWNING    Klebsiella oxytoca NOT DETECTED NOT DETECTED Final   Klebsiella pneumoniae NOT DETECTED NOT DETECTED Final   Proteus species NOT DETECTED NOT DETECTED Final   Serratia marcescens NOT DETECTED NOT DETECTED Final   Carbapenem resistance NOT DETECTED NOT DETECTED Final   Haemophilus influenzae NOT DETECTED NOT DETECTED Final   Neisseria meningitidis NOT DETECTED NOT DETECTED Final   Pseudomonas aeruginosa NOT DETECTED NOT DETECTED Final   Candida albicans NOT DETECTED NOT DETECTED Final   Candida glabrata NOT DETECTED NOT DETECTED Final   Candida krusei NOT DETECTED NOT DETECTED Final   Candida parapsilosis NOT DETECTED NOT DETECTED Final   Candida tropicalis NOT DETECTED NOT DETECTED Final    Comment:  Performed at [facility]         Radiology Studies: Ct Abdomen Pelvis Wo Contrast  Result Date: 06/17/2016 CLINICAL DATA:  Diarrhea with nausea and vomiting.  Weakness. EXAM: CT ABDOMEN AND PELVIS WITHOUT CONTRAST TECHNIQUE: Multidetector CT imaging of the abdomen and pelvis was performed following the standard protocol without IV contrast. COMPARISON:  06/11/2016. FINDINGS: Lower chest: Bibasilar atelectasis with tiny bilateral pleural effusions. Hepatobiliary: No focal abnormality in the liver on this study without intravenous contrast. Gallbladder is decompressed with tiny calcified gallstone noted. No intrahepatic or extrahepatic biliary dilation. Pancreas: No focal mass lesion. No dilatation of the main duct. No intraparenchymal cyst. No peripancreatic edema. Spleen: No splenomegaly. No focal mass lesion. Adrenals/Urinary Tract: No adrenal nodule or mass. Both kidneys remain within enlarged appearance and ill-defined margins suggesting edema/inflammation. Tiny nonobstructing stones are noted bilaterally. No evidence for hydroureteronephrosis. Foley catheter decompresses the urinary bladder. Stomach/Bowel: Stomach is nondistended. No gastric  wall thickening. No evidence of outlet obstruction. Duodenum is normally positioned as is the ligament of Treitz. No small bowel wall thickening. No small bowel dilatation. The terminal ileum is normal. The appendix is not visualized, but there is no edema or inflammation in the region of the cecum. No gross colonic mass. No colonic wall thickening. No substantial diverticular change. Vascular/Lymphatic: There is abdominal aortic atherosclerosis without aneurysm. There is no gastrohepatic or hepatoduodenal ligament lymphadenopathy. No intraperitoneal or retroperitoneal lymphadenopathy. Dystrophic calcifications seen anterior to the aorta and posterior to the SMV may be related to focal dystrophic calcification in the uncinate process of the pancreas  although calcified lymph node in the root of the small bowel mesentery would also be a consideration. Reproductive: The uterus has normal CT imaging appearance. There is no adnexal mass. Other: No intraperitoneal free fluid. Musculoskeletal: Bone windows reveal no worrisome lytic or sclerotic osseous lesions. IMPRESSION: 1. Scattered areas of wall thickening seen in small bowel and colon on the previous study resolved in the interval and may well within related to underdistention previously. 2. Both kidneys remain enlarged and edematous with perinephric edema/inflammation. No hydroureteronephrosis. Tiny bilateral nonobstructing stones are again noted. 3. Dystrophic calcification identified in the region of the uncinate process/mesenteric root. This may be related to focal parenchymal calcification in the pancreas or dystrophic calcification within central abdominal lymph nodes. Follow-up evaluation with intravenous contrast could be used when renal function normalizes. 4. New bibasilar atelectasis with tiny bilateral pleural effusions. 5.  Abdominal Aortic Atherosclerois (ICD10-170.0) Electronically Signed   By: Misty Stanley M.D.   On: 06/17/2016 15:36        Scheduled Meds: . atorvastatin  20 mg Oral Daily  . calcium carbonate  2 tablet Oral BID WC  . ferumoxytol  510 mg Intravenous Weekly  . insulin aspart  0-15 Units Subcutaneous TID WC  . insulin aspart  0-5 Units Subcutaneous QHS  . insulin aspart  3 Units Subcutaneous TID WC  . insulin aspart protamine- aspart  10 Units Subcutaneous BID WC  . magnesium chloride  1 tablet Oral BID  . meropenem (MERREM) IV  500 mg Intravenous Q12H  . metoprolol tartrate  25 mg Oral BID  . multivitamin with minerals  1 tablet Oral Daily  . sodium chloride flush  3 mL Intravenous Q12H   Continuous Infusions:    LOS: 7 days    Time spent: 30 minutes    Donyetta Ogletree, MD [facility]    If 7PM-7AM, please contact  night-coverage [email or web address] Password [email or web address] 06/18/2016, 7:59 AM

## 2016-06-18 NOTE — Progress Notes (Signed)
Subjective: 3.2 L uop yest. Creat down 2.70 today   Objective: Vital signs in last 24 hours: Temp:  [98.3 F (36.8 C)-98.9 F (37.2 C)] 98.3 F (36.8 C) (12/27 3710) Pulse Rate:  [90-105] 100 (12/27 0611) Resp:  [16-18] 16 (12/27 0611) BP: (151-155)/(69-78) 151/78 (12/27 0611) SpO2:  [95 %-98 %] 95 % (12/27 0611) Weight:  [35.6 kg (78 lb 7.7 oz)] 35.6 kg (78 lb 7.7 oz) (12/27 0611) Weight change: -1.6 kg (-3 lb 8.4 oz)  Intake/Output from previous day: 12/26 0701 - 12/27 0700 In: 14 [P.O.:720; IV Piggyback:50] Out: 3202 [Urine:3200; Stool:2] Intake/Output this shift: Total I/O In: -  Out: 400 [Urine:400]  Exam: Gen: small framed WF, tired, no distress Resp: bibasilar rales, no wheezing Cardio: regular rate and rhythm Ext: no edema NF Ox 3  CT abd > bilat perirenal stranding and bilat renal edema, mult nonobst stones bilat kidneys, no hydro, no ureteral stone, normal bladder UA 12/20 > many bact, large Hb, large LE/ neg nitrite, 30 prot, 0-5 rbc/ tntc wbc UCx/ Blood cx's > +E Colit   Assessment: 1 AKI - prob combination ACEi/ bilat pyelo w renal edema/ urosepsis/ nsaids. Creat 0.4 in August. Creat down slightly today. Good UOP, off lasix, hopefully will recover function. Repeat UA.  2 Pulm edema - mild, on admission, unusual presentation w urosepsis. Check echo.  3 Anemia - per primary 4 E.coli UTI and bacteremia 5 Hx CVA 6 HTN on MTP 25 bid; home acei on hold, bp's good 7 DNR  Plan:  As above   Kelly Splinter MD [employer] pgr [phone]   06/18/2016, 1:42 PM       Lab Results:  Recent Labs  06/17/16 0818 06/18/16 0451  WBC 19.9* 20.5*  HGB 11.5* 11.1*  HCT 34.0* 33.5*  PLT 301 336   BMET:   Recent Labs  06/17/16 0816 06/18/16 0451  NA 136 136  K 3.1* 4.2  CL 100* 103  CO2 24 24  GLUCOSE 347* 191*  BUN 48* 49*  CREATININE 2.74* 2.70*  CALCIUM 7.8* 8.2*   No results for input(s): PTH in the last 72 hours. Iron  Studies:  No results for input(s): IRON, TIBC, TRANSFERRIN, FERRITIN in the last 72 hours. Studies/Results: Ct Abdomen Pelvis Wo Contrast  Result Date: 06/17/2016 CLINICAL DATA:  Diarrhea with nausea and vomiting.  Weakness. EXAM: CT ABDOMEN AND PELVIS WITHOUT CONTRAST TECHNIQUE: Multidetector CT imaging of the abdomen and pelvis was performed following the standard protocol without IV contrast. COMPARISON:  06/11/2016. FINDINGS: Lower chest: Bibasilar atelectasis with tiny bilateral pleural effusions. Hepatobiliary: No focal abnormality in the liver on this study without intravenous contrast. Gallbladder is decompressed with tiny calcified gallstone noted. No intrahepatic or extrahepatic biliary dilation. Pancreas: No focal mass lesion. No dilatation of the main duct. No intraparenchymal cyst. No peripancreatic edema. Spleen: No splenomegaly. No focal mass lesion. Adrenals/Urinary Tract: No adrenal nodule or mass. Both kidneys remain within enlarged appearance and ill-defined margins suggesting edema/inflammation. Tiny nonobstructing stones are noted bilaterally. No evidence for hydroureteronephrosis. Foley catheter decompresses the urinary bladder. Stomach/Bowel: Stomach is nondistended. No gastric wall thickening. No evidence of outlet obstruction. Duodenum is normally positioned as is the ligament of Treitz. No small bowel wall thickening. No small bowel dilatation. The terminal ileum is normal. The appendix is not visualized, but there is no edema or inflammation in the region of the cecum. No gross colonic mass. No colonic wall thickening. No substantial diverticular change. Vascular/Lymphatic: There is  abdominal aortic atherosclerosis without aneurysm. There is no gastrohepatic or hepatoduodenal ligament lymphadenopathy. No intraperitoneal or retroperitoneal lymphadenopathy. Dystrophic calcifications seen anterior to the aorta and posterior to the SMV may be related to focal dystrophic calcification in  the uncinate process of the pancreas although calcified lymph node in the root of the small bowel mesentery would also be a consideration. Reproductive: The uterus has normal CT imaging appearance. There is no adnexal mass. Other: No intraperitoneal free fluid. Musculoskeletal: Bone windows reveal no worrisome lytic or sclerotic osseous lesions. IMPRESSION: 1. Scattered areas of wall thickening seen in small bowel and colon on the previous study resolved in the interval and may well within related to underdistention previously. 2. Both kidneys remain enlarged and edematous with perinephric edema/inflammation. No hydroureteronephrosis. Tiny bilateral nonobstructing stones are again noted. 3. Dystrophic calcification identified in the region of the uncinate process/mesenteric root. This may be related to focal parenchymal calcification in the pancreas or dystrophic calcification within central abdominal lymph nodes. Follow-up evaluation with intravenous contrast could be used when renal function normalizes. 4. New bibasilar atelectasis with tiny bilateral pleural effusions. 5.  Abdominal Aortic Atherosclerois (ICD10-170.0) Electronically Signed   By: Misty Stanley M.D.   On: 06/17/2016 15:36    Scheduled: . atorvastatin  20 mg Oral Daily  . calcium carbonate  2 tablet Oral BID WC  . ferumoxytol  510 mg Intravenous Weekly  . insulin aspart  0-15 Units Subcutaneous TID WC  . insulin aspart  0-5 Units Subcutaneous QHS  . insulin aspart protamine- aspart  12 Units Subcutaneous BID WC  . magnesium chloride  1 tablet Oral BID  . meropenem (MERREM) IV  500 mg Intravenous Q12H  . metoprolol tartrate  25 mg Oral BID  . multivitamin with minerals  1 tablet Oral Daily  . sodium chloride flush  3 mL Intravenous Q12H

## 2016-06-18 NOTE — Progress Notes (Addendum)
Physical Therapy Treatment Patient Details Name: [name] Michael MRN: [id number] DOB: December 14, 1960 Today's Date: 06/18/2016    History of Present Illness [name] Michael is a 55 y.o. female with medical history significant of diabetes mellitus, hyperlipidemia, hypertension, stroke. 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  Pt requires UE support at this time so utilized RW for ambulation.  Pt would benefit from SNF if assist not available at home.  Pt had 2 instances of LEs buckling requiring assist during ambulation.   Follow Up Recommendations  Home health PT;Supervision for mobility/OOB vs SNF     Equipment Recommendations  Rolling walker with 5" wheels (youth)    Recommendations for Other Services       Precautions / Restrictions Precautions Precautions: Fall Restrictions Weight Bearing Restrictions: No    Mobility  Bed Mobility Overal bed mobility: Needs Assistance Bed Mobility: Supine to Sit     Supine to sit: Min assist     General bed mobility comments: Vc for technique  Transfers Overall transfer level: Needs assistance Equipment used: Rolling walker (2 wheeled) Transfers: Sit to/from Stand Sit to Stand: Min assist         General transfer comment: assist to rise and steady, verbal cues for safe technique  Ambulation/Gait Ambulation/Gait assistance: Min assist Ambulation Distance (Feet): 60 Feet Assistive device: Rolling walker (2 wheeled) Gait Pattern/deviations: Step-through pattern;Decreased stride length     General Gait Details: therapist guided RW due to pt's poor vision, pt with 2 episodes of LEs buckling requiring assist, distance to tolerance   Stairs            Wheelchair  Mobility    Modified Rankin (Stroke Patients Only)       Balance                                    Cognition Arousal/Alertness: Awake/alert Behavior During Therapy: Flat affect Overall Cognitive Status: No family/caregiver present to determine baseline cognitive functioning Area of Impairment: Problem solving;Safety/judgement             Problem Solving: Slow processing;Requires verbal cues      Exercises      General Comments        Pertinent Vitals/Pain      Home Living                      Prior Function            PT Goals (current goals can now be found in the care plan section) Progress towards PT goals: Progressing toward goals    Frequency    Min 3X/week      PT Plan Current plan remains appropriate    Co-evaluation             End of Session Equipment Utilized During Treatment: Gait belt Activity Tolerance: Patient tolerated treatment well Patient left: in bed;with call bell/phone within reach;with bed alarm set     Time: 1275-1700 PT Time Calculation (min) (ACUTE ONLY): 19 min  Charges:  $Gait Training: 8-22 mins  G Codes:      [name] Michael,KATHrine E Jul 05, 2016, 12:28 PM Carmelia Bake, PT, DPT 07-05-2016 Pager: [phone]

## 2016-06-19 DIAGNOSIS — R1030 Lower abdominal pain, unspecified: Secondary | ICD-10-CM

## 2016-06-19 LAB — GLUCOSE, CAPILLARY
GLUCOSE-CAPILLARY: 338 mg/dL — AB (ref 65–99)
GLUCOSE-CAPILLARY: 179 mg/dL — AB (ref 65–99)
Glucose-Capillary: 266 mg/dL — ABNORMAL HIGH (ref 65–99)
Glucose-Capillary: 349 mg/dL — ABNORMAL HIGH (ref 65–99)

## 2016-06-19 LAB — CBC WITH DIFFERENTIAL/PLATELET
BASOS PCT: 0 %
Basophils Absolute: 0 10*3/uL (ref 0.0–0.1)
EOS PCT: 1 %
Eosinophils Absolute: 0.2 10*3/uL (ref 0.0–0.7)
HEMATOCRIT: 34.3 % — AB (ref 36.0–46.0)
HEMOGLOBIN: 11.2 g/dL — AB (ref 12.0–15.0)
Lymphocytes Relative: 12 %
Lymphs Abs: 2.4 10*3/uL (ref 0.7–4.0)
MCH: 26.5 pg (ref 26.0–34.0)
MCHC: 32.7 g/dL (ref 30.0–36.0)
MCV: 81.3 fL (ref 78.0–100.0)
MONO ABS: 1.2 10*3/uL — AB (ref 0.1–1.0)
MONOS PCT: 6 %
NEUTROS PCT: 81 %
Neutro Abs: 16.2 10*3/uL — ABNORMAL HIGH (ref 1.7–7.7)
PLATELETS: 358 10*3/uL (ref 150–400)
RBC: 4.22 MIL/uL (ref 3.87–5.11)
RDW: 15 % (ref 11.5–15.5)
WBC: 20 10*3/uL — ABNORMAL HIGH (ref 4.0–10.5)

## 2016-06-19 LAB — URINALYSIS, ROUTINE W REFLEX MICROSCOPIC
Bilirubin Urine: NEGATIVE
Glucose, UA: >=500 mg/dL — AB
KETONES UR: NEGATIVE mg/dL
Nitrite: NEGATIVE
PH: 7 (ref 5.0–8.0)
PROTEIN: NEGATIVE mg/dL
SQUAMOUS EPITHELIAL / LPF: NONE SEEN
Specific Gravity, Urine: 1.003 — ABNORMAL LOW (ref 1.005–1.030)

## 2016-06-19 LAB — COMPREHENSIVE METABOLIC PANEL
ALK PHOS: 90 U/L (ref 38–126)
ALT: 17 U/L (ref 14–54)
ANION GAP: 12 (ref 5–15)
AST: 15 U/L (ref 15–41)
Albumin: 2.2 g/dL — ABNORMAL LOW (ref 3.5–5.0)
BILIRUBIN TOTAL: 0.6 mg/dL (ref 0.3–1.2)
BUN: 58 mg/dL — ABNORMAL HIGH (ref 6–20)
CHLORIDE: 100 mmol/L — AB (ref 101–111)
CO2: 25 mmol/L (ref 22–32)
Calcium: 8.9 mg/dL (ref 8.9–10.3)
Creatinine, Ser: 2.52 mg/dL — ABNORMAL HIGH (ref 0.44–1.00)
GFR calc Af Amer: 24 mL/min — ABNORMAL LOW (ref >60)
GFR calc non Af Amer: 20 mL/min — ABNORMAL LOW (ref >60)
GLUCOSE: 242 mg/dL — AB (ref 65–99)
POTASSIUM: 4.5 mmol/L (ref 3.5–5.1)
Sodium: 137 mmol/L (ref 135–145)
Total Protein: 6.8 g/dL (ref 6.5–8.1)

## 2016-06-19 MED ORDER — INSULIN ASPART PROT & ASPART (70-30 MIX) 100 UNIT/ML ~~LOC~~ SUSP
15.0000 [IU] | Freq: Two times a day (BID) | SUBCUTANEOUS | Status: DC
  Administered 2016-06-19 – 2016-06-20 (×2): 15 [IU] via SUBCUTANEOUS

## 2016-06-19 MED ORDER — INSULIN ASPART PROT & ASPART (70-30 MIX) 100 UNIT/ML ~~LOC~~ SUSP
5.0000 [IU] | Freq: Once | SUBCUTANEOUS | Status: AC
  Administered 2016-06-19: 5 [IU] via SUBCUTANEOUS
  Filled 2016-06-19: qty 10

## 2016-06-19 NOTE — Progress Notes (Signed)
Inpatient Diabetes Program Recommendations  AACE/ADA: New Consensus Statement on Inpatient Glycemic Control (2015)  Target Ranges:  Prepandial:   less than 140 mg/dL      Peak postprandial:   less than 180 mg/dL (1-2 hours)      Critically ill patients:  140 - 180 mg/dL   Lab Results  Component Value Date   GLUCAP 266 (H) 06/19/2016   HGBA1C 10.9 04/29/2016    Review of Glycemic Control Results for [name] Michael, [name] Michael (MRN [id number]) as of 06/19/2016 08:09  Ref. Range 06/18/2016 07:41 06/18/2016 11:48 06/18/2016 16:32 06/18/2016 21:04 06/19/2016 07:56  Glucose-Capillary Latest Ref Range: 65 - 99 mg/dL 201 (H) 296 (H) 219 (H) 286 (H) 266 (H)   Inpatient Diabetes Program Recommendations:  Noted elevated CBGs.  Please consider increase in 70/30 to 15 bid.  Thank you, Nani Gasser. Codee Bloodworth, RN, MSN, CDE Inpatient Glycemic Control Team Team Pager [phone] (8am-5pm) 06/19/2016 8:10 AM

## 2016-06-19 NOTE — Progress Notes (Signed)
Subjective: good UOP , no change creat,. Eating solid foods, no c/o's.    Objective: Vital signs in last 24 hours: Temp:  [98.2 F (36.8 C)-99.1 F (37.3 C)] 98.2 F (36.8 C) (12/28 0442) Pulse Rate:  [96-110] 96 (12/28 0442) Resp:  [18-19] 18 (12/28 0442) BP: (153-168)/(74-84) 168/84 (12/28 0442) SpO2:  [93 %-96 %] 93 % (12/28 0442) Weight:  [35.9 kg (79 lb 2.3 oz)] 35.9 kg (79 lb 2.3 oz) (12/28 0442) Weight change: 0.3 kg (10.6 oz)  Intake/Output from previous day: 12/27 0701 - 12/28 0700 In: 530 [P.O.:480; IV Piggyback:50] Out: 2750 [Urine:2750] Intake/Output this shift: No intake/output data recorded.  Exam: Gen: small framed WF, tired, no distress Resp: bibasilar rales, no wheezing Cardio: regular rate and rhythm Ext: no edema NF Ox 3  CT abd > bilat perirenal stranding and sig bilat renal edema, mult nonobst stones bilat kidneys, no hydro, no ureteral stone, normal bladder UA 12/20 > many bact, large Hb, large LE/ neg nitrite, 30 prot, 0-5 rbc/ tntc wbc UCx/ Blood cx's > +E Colit   Assessment: 1 AKI - suspect due to severe bilat pyelo; marked kidney swelling bilat on admit CT.  May take some time to recover. Could have ATN as well (was on ACEi/ nsaid's). Creat was 0.4 in August.   2 Pulm edema - mild, resolved. Euvolemic  3 Anemia - per primary 4 E.coli pyelo and bacteremia 5 Hx CVA 6 HTN on MTP 25 bid; home acei on hold, bp's good 7 DNR  Plan:  As above   Kelly Splinter MD [employer] pgr [phone]   06/19/2016, 9:34 AM       Lab Results:  Recent Labs  06/18/16 0451 06/19/16 0427  WBC 20.5* 20.0*  HGB 11.1* 11.2*  HCT 33.5* 34.3*  PLT 336 358   BMET:   Recent Labs  06/18/16 0451 06/19/16 0427  NA 136 137  K 4.2 4.5  CL 103 100*  CO2 24 25  GLUCOSE 191* 242*  BUN 49* 58*  CREATININE 2.70* 2.52*  CALCIUM 8.2* 8.9   No results for input(s): PTH in the last 72 hours. Iron Studies:  No results for input(s): IRON,  TIBC, TRANSFERRIN, FERRITIN in the last 72 hours. Studies/Results: Ct Abdomen Pelvis Wo Contrast  Result Date: 06/17/2016 CLINICAL DATA:  Diarrhea with nausea and vomiting.  Weakness. EXAM: CT ABDOMEN AND PELVIS WITHOUT CONTRAST TECHNIQUE: Multidetector CT imaging of the abdomen and pelvis was performed following the standard protocol without IV contrast. COMPARISON:  06/11/2016. FINDINGS: Lower chest: Bibasilar atelectasis with tiny bilateral pleural effusions. Hepatobiliary: No focal abnormality in the liver on this study without intravenous contrast. Gallbladder is decompressed with tiny calcified gallstone noted. No intrahepatic or extrahepatic biliary dilation. Pancreas: No focal mass lesion. No dilatation of the main duct. No intraparenchymal cyst. No peripancreatic edema. Spleen: No splenomegaly. No focal mass lesion. Adrenals/Urinary Tract: No adrenal nodule or mass. Both kidneys remain within enlarged appearance and ill-defined margins suggesting edema/inflammation. Tiny nonobstructing stones are noted bilaterally. No evidence for hydroureteronephrosis. Foley catheter decompresses the urinary bladder. Stomach/Bowel: Stomach is nondistended. No gastric wall thickening. No evidence of outlet obstruction. Duodenum is normally positioned as is the ligament of Treitz. No small bowel wall thickening. No small bowel dilatation. The terminal ileum is normal. The appendix is not visualized, but there is no edema or inflammation in the region of the cecum. No gross colonic mass. No colonic wall thickening. No substantial diverticular change. Vascular/Lymphatic: There is abdominal  aortic atherosclerosis without aneurysm. There is no gastrohepatic or hepatoduodenal ligament lymphadenopathy. No intraperitoneal or retroperitoneal lymphadenopathy. Dystrophic calcifications seen anterior to the aorta and posterior to the SMV may be related to focal dystrophic calcification in the uncinate process of the pancreas  although calcified lymph node in the root of the small bowel mesentery would also be a consideration. Reproductive: The uterus has normal CT imaging appearance. There is no adnexal mass. Other: No intraperitoneal free fluid. Musculoskeletal: Bone windows reveal no worrisome lytic or sclerotic osseous lesions. IMPRESSION: 1. Scattered areas of wall thickening seen in small bowel and colon on the previous study resolved in the interval and may well within related to underdistention previously. 2. Both kidneys remain enlarged and edematous with perinephric edema/inflammation. No hydroureteronephrosis. Tiny bilateral nonobstructing stones are again noted. 3. Dystrophic calcification identified in the region of the uncinate process/mesenteric root. This may be related to focal parenchymal calcification in the pancreas or dystrophic calcification within central abdominal lymph nodes. Follow-up evaluation with intravenous contrast could be used when renal function normalizes. 4. New bibasilar atelectasis with tiny bilateral pleural effusions. 5.  Abdominal Aortic Atherosclerois (ICD10-170.0) Electronically Signed   By: Misty Stanley M.D.   On: 06/17/2016 15:36    Scheduled: . atorvastatin  20 mg Oral Daily  . calcium carbonate  2 tablet Oral BID WC  . ferumoxytol  510 mg Intravenous Weekly  . insulin aspart  0-15 Units Subcutaneous TID WC  . insulin aspart  0-5 Units Subcutaneous QHS  . insulin aspart protamine- aspart  15 Units Subcutaneous BID WC  . insulin aspart protamine- aspart  5 Units Subcutaneous Once  . magnesium chloride  1 tablet Oral BID  . meropenem (MERREM) IV  500 mg Intravenous Q12H  . metoprolol tartrate  25 mg Oral BID  . multivitamin with minerals  1 tablet Oral Daily  . sodium chloride flush  3 mL Intravenous Q12H

## 2016-06-19 NOTE — Progress Notes (Signed)
Occupational Therapy Treatment Patient Details Name: [name] Michael MRN: [id number] DOB: 07-10-1960 Today's Date: 06/19/2016    History of present illness [name] Michael is a 55 y.o. female with medical history significant of diabetes mellitus, hyperlipidemia, hypertension, stroke. 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 OT will continue to follow acutely  Follow Up Recommendations  SNF    Equipment Recommendations  None recommended by OT    Recommendations for Other Services      Precautions / Restrictions Precautions Precautions: Fall Restrictions Weight Bearing Restrictions: No       Mobility Bed Mobility Overal bed mobility: Needs Assistance Bed Mobility: Supine to Sit;Sit to Supine     Supine to sit: Min guard Sit to supine: Min assist   General bed mobility comments: min A with LEs back onto bed  Transfers Overall transfer level: Needs assistance Equipment used: 1 person hand held assist;Rolling walker (2 wheeled) Transfers: Sit to/from Stand Sit to Stand: Min guard;Min assist              Balance Overall balance assessment: Needs assistance   Sitting balance-Leahy Scale: Good       Standing balance-Leahy Scale: Poor                     ADL Overall ADL's : Needs assistance/impaired     Grooming: Wash/dry hands;Wash/dry face;Standing;Min guard               Lower Body Dressing: Sit to/from stand;Minimal assistance;Cueing for safety;Moderate assistance Lower Body Dressing Details (indicate cue type and reason): simulated Toilet Transfer: Cueing for safety;Cueing for sequencing;Minimal assistance;Grab bars;RW;Ambulation;Regular Toilet   Toileting- Clothing Manipulation and Hygiene: Sit to/from stand;Cueing for  safety;Moderate assistance;Minimal assistance                Vision  hx of L eye blindness               Additional Comments: hx blindness L eye              Cognition   Behavior During Therapy: Flat affect Overall Cognitive Status: No family/caregiver present to determine baseline cognitive functioning Area of Impairment: Problem solving;Safety/judgement              Problem Solving: Slow processing;Requires verbal cues      Extremity/Trunk Assessment   generalized weakness                        General Comments  pt pleasant and cooperative    Pertinent Vitals/ Pain       Pain Assessment: No/denies pain                                                          Frequency  Min 2X/week        Progress Toward Goals  OT Goals(current goals can now be found in the care plan section)  Progress towards OT goals: Progressing toward goals     Plan Discharge plan remains appropriate  End of Session Equipment Utilized During Treatment: Gait belt;Rolling walker   Activity Tolerance Patient tolerated treatment well   Patient Left with call bell/phone within reach;with nursing/sitter in room;in bed (nurse techs in to work with pt and will turn bed alarm back on)   Nurse Communication  nurse techs to turn bed alarm back on after working with pt        Time: 1254-1311 OT Time Calculation (min): 17 min  Charges: OT General Charges $OT Visit: 1 Procedure OT Treatments $Self Care/Home Management : 8-22 mins  Britt Bottom 06/19/2016, 1:31 PM

## 2016-06-19 NOTE — Progress Notes (Signed)
PROGRESS NOTE    [name] Michael  QAS:[id number] DOB: [birth year] DOA: 06/11/2016 PCP: Arnoldo Morale, MD    Brief Narrative:  55 y.o.femalewith medical history significant of diabetes mellitus, hyperlipidemia, hypertension, stroke. Patient is a poor historian but states that for several days she has been having diarrhea, nausea and vomiting, and weakness. Presented to the ED with UTI, hypokalemia, hypocalcemia, hypophosphatemia, hypomagnesemia, lactic acidosis. Found to have sepsis secondary to Escherichia coli bacteremia   Assessment & Plan:   Active Problems:   Diabetes ([place])   Essential hypertension   GERD   Hypokalemia   Hypocalcemia   Acute kidney injury (HCC)   Hypophosphatemia   Abdominal pain   Colitis   CHF (congestive heart failure) ([place])   Sepsis from E. Coli UTI/bacteremia Pyelonephritis Restarted IV antibiotics due to persistent fever and high white count, placed on meropenem 12/26 CT scan abdomen pelvis to rule out pelvic/perirenal abscess, shows nonobstructive stones but no definite abcess White blood cell count still elevated urology consulted for possible obstructive uropathy, since patient has not significantly improved over the course of last 7 days-no urology intervention indicated at this time   Anemia -Stable - low iron, low TIBC, MCV <80 - could be anemia of chronic disease vs sepsis - hemoccult negative    Acute kidney injury ([place]) baseline normal 0.43, presented with a creatinine of 2.49. Now 2.77>2.7>2.52 - nephrology following for several days - concern for fluid overload. Was given Lasix 120 mg IV every 8, discontinued 12/26 Renal function has been improving gradually - UOP of 2750 ml/24 hr  , - continue to monitor urine output      Diarrhea, improving -CT scan reported possible colitis.No symptoms of diarrhea - diarrhea has resolved, abdomen is nontender      Diabetes ([place]). Hemoglobin A1c 10.9 on 04/29/16 - CBG's improved last  24 hours Holding glipizide and metformin Increase  NPH 70/30 , 15 units twice a day     Essential hypertension - blood pressures reasonably controlled on PO metoprolol    GERD - Placed on tums for calcium replacement but may be used for gerd    Hypokalemia - WNL this am  Hypomagnesemia, Replete - will repeat lab tomorrow - continue mag tab daily    Hypocalcemia-this needs to be repleted, continue telemetry - continue to monitor - continue tums    DVT prophylaxis: SCDs Code Status: DNR Family Communication: no family bedside this am Disposition Plan: 3-4 days   Consultants:   Nephrology  Urology  Procedures:   None  Antimicrobials:   Rocephin 12/20>12/22  Zosyn 12/22> 12/24  Ceftin 12/24>   Subjective: Feels better today , denies nausea, AP Objective: Vitals:   06/18/16 0611 06/18/16 1520 06/18/16 2101 06/19/16 0442  BP: (!) 151/78 (!) 153/82 (!) 158/74 (!) 168/84  Pulse: 100 (!) 102 (!) 110 96  Resp: 16 19 18 18   Temp: 98.3 F (36.8 C) 99.1 F (37.3 C) 98.9 F (37.2 C) 98.2 F (36.8 C)  TempSrc: Oral Oral Oral Oral  SpO2: 95% 95% 96% 93%  Weight: 35.6 kg (78 lb 7.7 oz)   35.9 kg (79 lb 2.3 oz)  Height:        Intake/Output Summary (Last 24 hours) at 06/19/16 0848 Last data filed at 06/19/16 0444  Gross per 24 hour  Intake              410 ml  Output             2350  ml  Net            -1940 ml   Filed Weights   06/17/16 0443 06/18/16 0611 06/19/16 0442  Weight: 37.2 kg (82 lb 0.2 oz) 35.6 kg (78 lb 7.7 oz) 35.9 kg (79 lb 2.3 oz)    Examination:  General exam: Appears calm and comfortable  Respiratory system: Clear to auscultation. Respiratory effort normal. Cardiovascular system: S1 & S2 heard, RRR. No JVD, murmurs, rubs, gallops or clicks. No pedal edema. Gastrointestinal system: Abdomen is nondistended, soft and nontender. No organomegaly or masses felt. Normal bowel sounds heard. Central nervous system: Alert and oriented.  No focal neurological deficits. Extremities: Symmetric 5 x 5 power. Skin: No rashes, lesions or ulcers Psychiatry: Judgement and insight appear normal. Mood & affect appropriate.     Data Reviewed: I have personally reviewed following labs and imaging studies  CBC:  Recent Labs Lab 06/12/16 2038 06/13/16 0348 06/13/16 0843 06/15/16 0826 06/17/16 0818 06/18/16 0451 06/19/16 0427  WBC 24.3* 23.0*  --  18.8* 19.9* 20.5* 20.0*  NEUTROABS 22.6* 21.3*  --  16.3*  --   --  16.2*  HGB 7.3* 6.6* 7.1* 12.1 11.5* 11.1* 11.2*  HCT 22.0* 19.9* 21.2* 35.2* 34.0* 33.5* 34.3*  MCV 78.6 79.6  --  79.5 79.4 80.7 81.3  PLT 624* 498*  --  366 301 336 809   Basic Metabolic Panel:  Recent Labs Lab 06/12/16 2038 06/13/16 0348  06/15/16 0826 06/16/16 0430 06/17/16 0816 06/17/16 0818 06/18/16 0451 06/19/16 0427  NA 139 137  < > 139 137 136  --  136 137  K 3.9 3.5  < > 3.4* 4.2 3.1*  --  4.2 4.5  CL 104 108  < > 102 103 100*  --  103 100*  CO2 21* 17*  < > 22 22 24   --  24 25  GLUCOSE 301* 251*  < > 269* 196* 347*  --  191* 242*  BUN 37* 34*  < > 46* 48* 48*  --  49* 58*  CREATININE 2.53* 2.33*  < > 2.78* 2.77* 2.74*  --  2.70* 2.52*  CALCIUM 7.0* 6.2*  < > 6.8* 7.2* 7.8*  --  8.2* 8.9  MG 2.3 1.8  --   --   --   --  1.6*  --   --   PHOS  --   --   --   --  3.4  --   --   --   --   < > = values in this interval not displayed. GFR: Estimated Creatinine Clearance: 13.1 mL/min (by C-G formula based on SCr of 2.52 mg/dL (H)). Liver Function Tests:  Recent Labs Lab 06/16/16 0430 06/18/16 0451 06/19/16 0427  AST  --  17 15  ALT  --  17 17  ALKPHOS  --  95 90  BILITOT  --  0.6 0.6  PROT  --  6.6 6.8  ALBUMIN 1.9* 2.1* 2.2*   No results for input(s): LIPASE, AMYLASE in the last 168 hours. No results for input(s): AMMONIA in the last 168 hours. Coagulation Profile: No results for input(s): INR, PROTIME in the last 168 hours. Cardiac Enzymes: No results for input(s): CKTOTAL, CKMB,  CKMBINDEX, TROPONINI in the last 168 hours. BNP (last 3 results) No results for input(s): PROBNP in the last 8760 hours. HbA1C: No results for input(s): HGBA1C in the last 72 hours. CBG:  Recent Labs Lab 06/18/16 0741 06/18/16 1148 06/18/16 1632 06/18/16  2104 06/19/16 0756  GLUCAP 201* 296* 219* 286* 266*   Lipid Profile: No results for input(s): CHOL, HDL, LDLCALC, TRIG, CHOLHDL, LDLDIRECT in the last 72 hours. Thyroid Function Tests: No results for input(s): TSH, T4TOTAL, FREET4, T3FREE, THYROIDAB in the last 72 hours. Anemia Panel: No results for input(s): VITAMINB12, FOLATE, FERRITIN, TIBC, IRON, RETICCTPCT in the last 72 hours. Sepsis Labs:  Recent Labs Lab 06/12/16 2038 06/13/16 0054 06/13/16 0348  LATICACIDVEN 4.1* 1.9 1.5    Recent Results (from the past 240 hour(s))  Urine culture     Status: Abnormal   Collection Time: 06/11/16  2:33 PM  Result Value Ref Range Status   Specimen Description URINE, CLEAN CATCH  Final   Special Requests NONE  Final   Culture >=100,000 COLONIES/mL ESCHERICHIA COLI (A)  Final   Report Status 06/14/2016 FINAL  Final   Organism ID, Bacteria ESCHERICHIA COLI (A)  Final      Susceptibility   Escherichia coli - MIC*    AMPICILLIN >=32 RESISTANT Resistant     CEFAZOLIN <=4 SENSITIVE Sensitive     CEFTRIAXONE <=1 SENSITIVE Sensitive     CIPROFLOXACIN <=0.25 SENSITIVE Sensitive     GENTAMICIN <=1 SENSITIVE Sensitive     IMIPENEM <=0.25 SENSITIVE Sensitive     NITROFURANTOIN <=16 SENSITIVE Sensitive     TRIMETH/SULFA <=20 SENSITIVE Sensitive     AMPICILLIN/SULBACTAM 16 INTERMEDIATE Intermediate     PIP/TAZO <=4 SENSITIVE Sensitive     Extended ESBL NEGATIVE Sensitive     * >=100,000 COLONIES/mL ESCHERICHIA COLI  MRSA PCR Screening     Status: None   Collection Time: 06/11/16  8:49 PM  Result Value Ref Range Status   MRSA by PCR NEGATIVE NEGATIVE Final    Comment:        The GeneXpert MRSA Assay (FDA approved for NASAL  specimens only), is one component of a comprehensive MRSA colonization surveillance program. It is not intended to diagnose MRSA infection nor to guide or monitor treatment for MRSA infections.   Culture, blood (routine x 2)     Status: Abnormal   Collection Time: 06/12/16  1:36 AM  Result Value Ref Range Status   Specimen Description BLOOD RIGHT ANTECUBITAL  Final   Special Requests IN PEDIATRIC BOTTLE 3ML  Final   Culture  Setup Time   Final    GRAM NEGATIVE RODS AEROBIC BOTTLE ONLY CRITICAL RESULT CALLED TO, READ BACK BY AND VERIFIED WITHKeturah Barre [facility] PHARMD [id number] 06/12/16 A BROWNING Performed at [place] (A)  Final   Report Status 06/14/2016 FINAL  Final   Organism ID, Bacteria ESCHERICHIA COLI  Final      Susceptibility   Escherichia coli - MIC*    AMPICILLIN >=32 RESISTANT Resistant     CEFAZOLIN <=4 SENSITIVE Sensitive     CEFEPIME <=1 SENSITIVE Sensitive     CEFTAZIDIME <=1 SENSITIVE Sensitive     CEFTRIAXONE <=1 SENSITIVE Sensitive     CIPROFLOXACIN <=0.25 SENSITIVE Sensitive     GENTAMICIN <=1 SENSITIVE Sensitive     IMIPENEM <=0.25 SENSITIVE Sensitive     TRIMETH/SULFA <=20 SENSITIVE Sensitive     AMPICILLIN/SULBACTAM 16 INTERMEDIATE Intermediate     PIP/TAZO <=4 SENSITIVE Sensitive     Extended ESBL NEGATIVE Sensitive     * ESCHERICHIA COLI  Culture, blood (routine x 2)     Status: Abnormal   Collection Time: 06/12/16  1:36 AM  Result Value Ref Range Status  Specimen Description BLOOD RIGHT HAND  Final   Special Requests IN PEDIATRIC BOTTLE 2ML  Final   Culture  Setup Time   Final    GRAM NEGATIVE RODS AEROBIC BOTTLE ONLY CRITICAL RESULT CALLED TO, READ BACK BY AND VERIFIED WITH: D [facility] PHARMD [id number] 06/12/16 A BROWNING    Culture (A)  Final    ESCHERICHIA COLI SUSCEPTIBILITIES PERFORMED ON PREVIOUS CULTURE WITHIN THE LAST 5 DAYS. Performed at [facility]    Report Status 06/14/2016 FINAL  Final  Blood  Culture ID Panel (Reflexed)     Status: Abnormal   Collection Time: 06/12/16  1:36 AM  Result Value Ref Range Status   Enterococcus species NOT DETECTED NOT DETECTED Final   Listeria monocytogenes NOT DETECTED NOT DETECTED Final   Staphylococcus species NOT DETECTED NOT DETECTED Final   Staphylococcus aureus NOT DETECTED NOT DETECTED Final   Streptococcus species NOT DETECTED NOT DETECTED Final   Streptococcus agalactiae NOT DETECTED NOT DETECTED Final   Streptococcus pneumoniae NOT DETECTED NOT DETECTED Final   Streptococcus pyogenes NOT DETECTED NOT DETECTED Final   Acinetobacter baumannii NOT DETECTED NOT DETECTED Final   Enterobacteriaceae species DETECTED (A) NOT DETECTED Final    Comment: CRITICAL RESULT CALLED TO, READ BACK BY AND VERIFIED WITH: D [place] PHARMD 1840 06/12/16 A BROWNING    Enterobacter cloacae complex NOT DETECTED NOT DETECTED Final   Escherichia coli DETECTED (A) NOT DETECTED Final    Comment: CRITICAL RESULT CALLED TO, READ BACK BY AND VERIFIED WITH: D [facility] PHARMD 1840 06/12/16 A BROWNING    Klebsiella oxytoca NOT DETECTED NOT DETECTED Final   Klebsiella pneumoniae NOT DETECTED NOT DETECTED Final   Proteus species NOT DETECTED NOT DETECTED Final   Serratia marcescens NOT DETECTED NOT DETECTED Final   Carbapenem resistance NOT DETECTED NOT DETECTED Final   Haemophilus influenzae NOT DETECTED NOT DETECTED Final   Neisseria meningitidis NOT DETECTED NOT DETECTED Final   Pseudomonas aeruginosa NOT DETECTED NOT DETECTED Final   Candida albicans NOT DETECTED NOT DETECTED Final   Candida glabrata NOT DETECTED NOT DETECTED Final   Candida krusei NOT DETECTED NOT DETECTED Final   Candida parapsilosis NOT DETECTED NOT DETECTED Final   Candida tropicalis NOT DETECTED NOT DETECTED Final    Comment: Performed at [facility]         Radiology Studies: Ct Abdomen Pelvis Wo Contrast  Result Date: 06/17/2016 CLINICAL DATA:  Diarrhea with nausea and  vomiting.  Weakness. EXAM: CT ABDOMEN AND PELVIS WITHOUT CONTRAST TECHNIQUE: Multidetector CT imaging of the abdomen and pelvis was performed following the standard protocol without IV contrast. COMPARISON:  06/11/2016. FINDINGS: Lower chest: Bibasilar atelectasis with tiny bilateral pleural effusions. Hepatobiliary: No focal abnormality in the liver on this study without intravenous contrast. Gallbladder is decompressed with tiny calcified gallstone noted. No intrahepatic or extrahepatic biliary dilation. Pancreas: No focal mass lesion. No dilatation of the main duct. No intraparenchymal cyst. No peripancreatic edema. Spleen: No splenomegaly. No focal mass lesion. Adrenals/Urinary Tract: No adrenal nodule or mass. Both kidneys remain within enlarged appearance and ill-defined margins suggesting edema/inflammation. Tiny nonobstructing stones are noted bilaterally. No evidence for hydroureteronephrosis. Foley catheter decompresses the urinary bladder. Stomach/Bowel: Stomach is nondistended. No gastric wall thickening. No evidence of outlet obstruction. Duodenum is normally positioned as is the ligament of Treitz. No small bowel wall thickening. No small bowel dilatation. The terminal ileum is normal. The appendix is not visualized, but there is no edema or inflammation in the region of  the cecum. No gross colonic mass. No colonic wall thickening. No substantial diverticular change. Vascular/Lymphatic: There is abdominal aortic atherosclerosis without aneurysm. There is no gastrohepatic or hepatoduodenal ligament lymphadenopathy. No intraperitoneal or retroperitoneal lymphadenopathy. Dystrophic calcifications seen anterior to the aorta and posterior to the SMV may be related to focal dystrophic calcification in the uncinate process of the pancreas although calcified lymph node in the root of the small bowel mesentery would also be a consideration. Reproductive: The uterus has normal CT imaging appearance. There is no  adnexal mass. Other: No intraperitoneal free fluid. Musculoskeletal: Bone windows reveal no worrisome lytic or sclerotic osseous lesions. IMPRESSION: 1. Scattered areas of wall thickening seen in small bowel and colon on the previous study resolved in the interval and may well within related to underdistention previously. 2. Both kidneys remain enlarged and edematous with perinephric edema/inflammation. No hydroureteronephrosis. Tiny bilateral nonobstructing stones are again noted. 3. Dystrophic calcification identified in the region of the uncinate process/mesenteric root. This may be related to focal parenchymal calcification in the pancreas or dystrophic calcification within central abdominal lymph nodes. Follow-up evaluation with intravenous contrast could be used when renal function normalizes. 4. New bibasilar atelectasis with tiny bilateral pleural effusions. 5.  Abdominal Aortic Atherosclerois (ICD10-170.0) Electronically Signed   By: Misty Stanley M.D.   On: 06/17/2016 15:36        Scheduled Meds: . atorvastatin  20 mg Oral Daily  . calcium carbonate  2 tablet Oral BID WC  . ferumoxytol  510 mg Intravenous Weekly  . insulin aspart  0-15 Units Subcutaneous TID WC  . insulin aspart  0-5 Units Subcutaneous QHS  . insulin aspart protamine- aspart  15 Units Subcutaneous BID WC  . insulin aspart protamine- aspart  5 Units Subcutaneous Once  . magnesium chloride  1 tablet Oral BID  . meropenem (MERREM) IV  500 mg Intravenous Q12H  . metoprolol tartrate  25 mg Oral BID  . multivitamin with minerals  1 tablet Oral Daily  . sodium chloride flush  3 mL Intravenous Q12H   Continuous Infusions:    LOS: 8 days    Time spent: 30 minutes    Amrit Cress, MD [facility]    If 7PM-7AM, please contact night-coverage [email or web address] Password [facility] 06/19/2016, 8:48 AM

## 2016-06-19 NOTE — Progress Notes (Signed)
Physical Therapy Treatment Patient Details Name: [name] Michael MRN: [id number] DOB: 1960-12-12 Today's Date: 06/19/2016    History of Present Illness [name] Michael is a 55 y.o. female with medical history significant of diabetes mellitus, hyperlipidemia, hypertension, stroke. 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  Pt able to improve distance as well.  Follow Up Recommendations  Home health PT;Supervision for mobility/OOB     Equipment Recommendations  Rolling walker with 5" wheels (youth)    Recommendations for Other Services       Precautions / Restrictions Precautions Precautions: Fall Restrictions Weight Bearing Restrictions: No    Mobility  Bed Mobility Overal bed mobility: Needs Assistance Bed Mobility: Supine to Sit;Sit to Supine     Supine to sit: Min guard Sit to supine: Min assist   General bed mobility comments: Vc for technique, assist for LEs onto bed  Transfers Overall transfer level: Needs assistance Equipment used: Rolling walker (2 wheeled) Transfers: Sit to/from Stand Sit to Stand: Min guard         General transfer comment:  verbal cues for safe technique  Ambulation/Gait Ambulation/Gait assistance: Min assist Ambulation Distance (Feet): 140 Feet Assistive device: Rolling walker (2 wheeled) Gait Pattern/deviations: Step-through pattern;Decreased stride length     General Gait Details: pt negotiated with RW better today, verbal cues for obstacles and occasional min assist for positioning RW, pt with 2 episodes of LEs buckling requiring assist, distance to tolerance   Stairs            Wheelchair Mobility    Modified Rankin (Stroke Patients Only)       Balance  Overall balance assessment: Needs assistance   Sitting balance-Leahy Scale: Good       Standing balance-Leahy Scale: Poor                      Cognition Arousal/Alertness: Awake/alert Behavior During Therapy: Flat affect Overall Cognitive Status: No family/caregiver present to determine baseline cognitive functioning Area of Impairment: Problem solving;Safety/judgement             Problem Solving: Slow processing;Requires verbal cues      Exercises      General Comments        Pertinent Vitals/Pain Pain Assessment: No/denies pain    Home Living                      Prior Function            PT Goals (current goals can now be found in the care plan section) Progress towards PT goals: Progressing toward goals    Frequency    Min 3X/week      PT Plan Current plan remains appropriate    Co-evaluation             End of Session Equipment Utilized During Treatment: Gait belt Activity Tolerance: Patient tolerated treatment well Patient left: in bed;with call bell/phone within reach;with bed alarm set     Time: 4268-3419 PT Time Calculation (min) (ACUTE ONLY): 13 min  Charges:  $Gait Training: 8-22 mins  G Codes:      Chicquita Mendel,KATHrine E Jul 06, 2016, 4:14 PM Carmelia Bake, PT, DPT 2016/07/06 Pager: [phone]

## 2016-06-20 LAB — GLUCOSE, CAPILLARY
GLUCOSE-CAPILLARY: 319 mg/dL — AB (ref 65–99)
GLUCOSE-CAPILLARY: 268 mg/dL — AB (ref 65–99)
Glucose-Capillary: 187 mg/dL — ABNORMAL HIGH (ref 65–99)
Glucose-Capillary: 222 mg/dL — ABNORMAL HIGH (ref 65–99)
Glucose-Capillary: 79 mg/dL (ref 65–99)

## 2016-06-20 LAB — BASIC METABOLIC PANEL
ANION GAP: 13 (ref 5–15)
BUN: 66 mg/dL — ABNORMAL HIGH (ref 6–20)
CALCIUM: 8.8 mg/dL — AB (ref 8.9–10.3)
CO2: 24 mmol/L (ref 22–32)
CREATININE: 2.89 mg/dL — AB (ref 0.44–1.00)
Chloride: 96 mmol/L — ABNORMAL LOW (ref 101–111)
GFR, EST AFRICAN AMERICAN: 20 mL/min — AB (ref >60)
GFR, EST NON AFRICAN AMERICAN: 17 mL/min — AB (ref >60)
Glucose, Bld: 210 mg/dL — ABNORMAL HIGH (ref 65–99)
Potassium: 4.9 mmol/L (ref 3.5–5.1)
SODIUM: 133 mmol/L — AB (ref 135–145)

## 2016-06-20 LAB — MAGNESIUM: MAGNESIUM: 2.2 mg/dL (ref 1.7–2.4)

## 2016-06-20 MED ORDER — INSULIN ASPART PROT & ASPART (70-30 MIX) 100 UNIT/ML ~~LOC~~ SUSP
25.0000 [IU] | Freq: Two times a day (BID) | SUBCUTANEOUS | Status: DC
  Administered 2016-06-20 – 2016-06-22 (×4): 25 [IU] via SUBCUTANEOUS

## 2016-06-20 MED ORDER — SODIUM CHLORIDE 0.9 % IV SOLN
INTRAVENOUS | Status: DC
Start: 2016-06-20 — End: 2016-06-29
  Administered 2016-06-20 – 2016-06-28 (×11): via INTRAVENOUS

## 2016-06-20 MED ORDER — INSULIN GLARGINE 100 UNIT/ML ~~LOC~~ SOLN
15.0000 [IU] | Freq: Once | SUBCUTANEOUS | Status: AC
  Administered 2016-06-20: 15 [IU] via SUBCUTANEOUS
  Filled 2016-06-20: qty 0.15

## 2016-06-20 NOTE — Care Management Note (Signed)
Case Management Note  Patient Details  Name: [name] Michael MRN: [id number] Date of Birth: 1960-07-06  Subjective/Objective: Presented to the ED with UTI, hypokalemia, hypocalcemia, hypophosphatemia, hypomagnesemia, lactic acidosis. Found to have sepsis secondary to Escherichia coli bacteremia. Today continue to increase BUN 66, Creat 2.89.                      Action/Plan:Pt's plan to dc home with sister and [facility]   Expected Discharge Date:   (unknown)               Expected Discharge Plan:  [place]  In-House Referral:  Clinical Social Work  Discharge planning Services  CM Consult  Post Acute Care Choice:    Choice offered to:  Patient  DME Arranged:    DME Agency:     HH Arranged:  RN, NA, Social Work [employer] Agency:  [place]  Status of Service:  In process, will continue to follow  If discussed at Long Length of Stay Meetings, dates discussed:    Additional CommentsPurcell Mouton, RN 06/20/2016, 11:15 AM

## 2016-06-20 NOTE — Progress Notes (Signed)
Physical Therapy Treatment Patient Details Name: [name] Michael MRN: [id number] DOB: 10-Nov-1960 Today's Date: 2016/07/06    History of Present Illness MEILYN HEINDL is a 55 y.o. female with medical history significant of diabetes mellitus, hyperlipidemia, hypertension, stroke. 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  Follow Up Recommendations  Home health PT;Supervision for mobility/OOB     Equipment Recommendations  Rolling walker with 5" wheels (youth)    Recommendations for Other Services       Precautions / Restrictions Precautions Precautions: Fall    Mobility  Bed Mobility Overal bed mobility: Needs Assistance Bed Mobility: Supine to Sit;Sit to Supine     Supine to sit: Min guard Sit to supine: Min assist   General bed mobility comments: Vc for technique, assist for LEs onto bed  Transfers Overall transfer level: Needs assistance Equipment used: Rolling walker (2 wheeled) Transfers: Sit to/from Stand Sit to Stand: Min assist         General transfer comment:  verbal cues for safe technique, assist required today to rise  Ambulation/Gait Ambulation/Gait assistance: Min guard Ambulation Distance (Feet): 200 Feet Assistive device: Rolling walker (2 wheeled) Gait Pattern/deviations: Step-through pattern;Decreased stride length     General Gait Details:  verbal cues for obstacles and occasional min assist for positioning RW, no episodes of LEs buckling today, distance to tolerance   Stairs            Wheelchair Mobility    Modified Rankin (Stroke Patients Only)       Balance                                    Cognition Arousal/Alertness: Awake/alert Behavior During  Therapy: Flat affect Overall Cognitive Status: No family/caregiver present to determine baseline cognitive functioning Area of Impairment: Problem solving;Safety/judgement             Problem Solving: Slow processing;Requires verbal cues      Exercises      General Comments        Pertinent Vitals/Pain Pain Assessment: No/denies pain    Home Living                      Prior Function            PT Goals (current goals can now be found in the care plan section) Progress towards PT goals: Progressing toward goals    Frequency    Min 3X/week      PT Plan Current plan remains appropriate    Co-evaluation             End of Session Equipment Utilized During Treatment: Gait belt Activity Tolerance: Patient tolerated treatment well Patient left: in bed;with call bell/phone within reach;with bed alarm set     Time: 1157-2620 PT Time Calculation (min) (ACUTE ONLY): 19 min  Charges:  $Gait Training: 8-22 mins                    G Codes:      Coletta Lockner,KATHrine E 2016-07-06, 1:03 PM Carmelia Bake, PT, DPT 07-06-2016 Pager: [phone]

## 2016-06-20 NOTE — Progress Notes (Addendum)
PROGRESS NOTE    [name] Michael  CLE:[id number] DOB: 10-Aug-1960 DOA: 06/11/2016 PCP: Arnoldo Morale, MD    Brief Narrative:  55 y.o.femalewith medical history significant of diabetes mellitus, hyperlipidemia, hypertension, stroke. Patient   states that for several days she has been having diarrhea, nausea and vomiting, and weakness. Presented to the ED with UTI, hypokalemia, hypocalcemia, hypophosphatemia, hypomagnesemia, lactic acidosis. Found to have sepsis secondary to Escherichia coli bacteremia, AKI   Assessment & Plan:   Active Problems:   Diabetes ([place])   Essential hypertension   GERD   Hypokalemia   Hypocalcemia   Acute kidney injury (HCC)   Hypophosphatemia   Abdominal pain   Colitis   CHF (congestive heart failure) ([place])   Sepsis from E. Coli UTI/bacteremia Pyelonephritis Restarted IV antibiotics due to persistent fever and high white count, placed on meropenem 12/26 CT scan abdomen pelvis to rule out pelvic/perirenal abscess, shows nonobstructive stones but no definite abcess White blood cell count still elevated urology consulted for possible obstructive uropathy, since patient has not significantly improved over the course of last 7 days-no urology intervention indicated at this time   Anemia -Stable - low iron, low TIBC, MCV <80 - could be anemia of chronic disease vs sepsis - hemoccult negative    Acute kidney injury ([place]) baseline normal 0.43, presented with a creatinine of 2.49. Now 2.77>2.7>2.52>2.89 - nephrology following for several days - concern for fluid overload. Initially given Lasix 120 mg IV every 8, discontinued 12/26 Creatinine up today from yesterday likely secondary to diuresis in the setting of uncontrolled CBG's  - UOP of 2800 ml/24 hr  , - continue to monitor urine output  start IVF at 75 cc/hr , nephology agrees    [facility]  -CT scan reported possible colitis.No symptoms of diarrhea - diarrhea has resolved, abdomen is  nontender      Diabetes ([place]). Hemoglobin A1c 10.9 on 04/29/16-uncontrolled Despite up titration of insulin Holding glipizide and metformin Increase  NPH 70/30 , 25 units twice a day We will give 1 dose of Lantus this morning   if Accu-Cheks remain  uncontrolled with 70/30 may need to switch to Lantus     Essential hypertension - blood pressures reasonably controlled on PO metoprolol    GERD - Placed on tums for calcium replacement but may be used for gerd    Hypokalemia - WNL this am  Hypomagnesemia, Replete - will repeat lab tomorrow - continue mag tab daily    Hypocalcemia-this needs to be repleted, continue telemetry - continue to monitor - continue tums    DVT prophylaxis: SCDs Code Status: DNR Family Communication: no family bedside this am Disposition Plan:  Pending improvement in renal function   Consultants:   Nephrology  Urology  Procedures:   None  Antimicrobials:   Rocephin 12/20>12/22  Zosyn 12/22> 12/24  Ceftin 12/24>   Subjective: Feels well, Accu-Cheks uncontrolled   Objective: Vitals:   06/19/16 1300 06/19/16 2237 06/20/16 0610 06/20/16 0611  BP: (!) 168/87 (!) 144/72 (!) 152/69   Pulse: 91 94 96   Resp: 18 18 18    Temp: 98.6 F (37 C) 98.9 F (37.2 C) 97.4 F (36.3 C)   TempSrc: Oral Oral Oral   SpO2: 98% 94% 96%   Weight:    37.2 kg (82 lb 0.2 oz)  Height:        Intake/Output Summary (Last 24 hours) at 06/20/16 0915 Last data filed at 06/20/16 0611  Gross per 24 hour  Intake  700 ml  Output             2800 ml  Net            -2100 ml   Filed Weights   06/18/16 0611 06/19/16 0442 06/20/16 0611  Weight: 35.6 kg (78 lb 7.7 oz) 35.9 kg (79 lb 2.3 oz) 37.2 kg (82 lb 0.2 oz)    Examination:  General exam: Appears calm and comfortable  Respiratory system: Clear to auscultation. Respiratory effort normal. Cardiovascular system: S1 & S2 heard, RRR. No JVD, murmurs, rubs, gallops or clicks. No  pedal edema. Gastrointestinal system: Abdomen is nondistended, soft and nontender. No organomegaly or masses felt. Normal bowel sounds heard. Central nervous system: Alert and oriented. No focal neurological deficits. Extremities: Symmetric 5 x 5 power. Skin: No rashes, lesions or ulcers Psychiatry: Judgement and insight appear normal. Mood & affect appropriate.     Data Reviewed: I have personally reviewed following labs and imaging studies  CBC:  Recent Labs Lab 06/15/16 0826 06/17/16 0818 06/18/16 0451 06/19/16 0427  WBC 18.8* 19.9* 20.5* 20.0*  NEUTROABS 16.3*  --   --  16.2*  HGB 12.1 11.5* 11.1* 11.2*  HCT 35.2* 34.0* 33.5* 34.3*  MCV 79.5 79.4 80.7 81.3  PLT 366 301 336 341   Basic Metabolic Panel:  Recent Labs Lab 06/16/16 0430 06/17/16 0816 06/17/16 0818 06/18/16 0451 06/19/16 0427 06/20/16 0351  NA 137 136  --  136 137 133*  K 4.2 3.1*  --  4.2 4.5 4.9  CL 103 100*  --  103 100* 96*  CO2 22 24  --  24 25 24   GLUCOSE 196* 347*  --  191* 242* 210*  BUN 48* 48*  --  49* 58* 66*  CREATININE 2.77* 2.74*  --  2.70* 2.52* 2.89*  CALCIUM 7.2* 7.8*  --  8.2* 8.9 8.8*  MG  --   --  1.6*  --   --   --   PHOS 3.4  --   --   --   --   --    GFR: Estimated Creatinine Clearance: 11.4 mL/min (by C-G formula based on SCr of 2.89 mg/dL (H)). Liver Function Tests:  Recent Labs Lab 06/16/16 0430 06/18/16 0451 06/19/16 0427  AST  --  17 15  ALT  --  17 17  ALKPHOS  --  95 90  BILITOT  --  0.6 0.6  PROT  --  6.6 6.8  ALBUMIN 1.9* 2.1* 2.2*   No results for input(s): LIPASE, AMYLASE in the last 168 hours. No results for input(s): AMMONIA in the last 168 hours. Coagulation Profile: No results for input(s): INR, PROTIME in the last 168 hours. Cardiac Enzymes: No results for input(s): CKTOTAL, CKMB, CKMBINDEX, TROPONINI in the last 168 hours. BNP (last 3 results) No results for input(s): PROBNP in the last 8760 hours. HbA1C: No results for input(s): HGBA1C in  the last 72 hours. CBG:  Recent Labs Lab 06/19/16 0756 06/19/16 1129 06/19/16 1229 06/19/16 1646 06/20/16 0739  GLUCAP 266* 349* 338* 179* 319*   Lipid Profile: No results for input(s): CHOL, HDL, LDLCALC, TRIG, CHOLHDL, LDLDIRECT in the last 72 hours. Thyroid Function Tests: No results for input(s): TSH, T4TOTAL, FREET4, T3FREE, THYROIDAB in the last 72 hours. Anemia Panel: No results for input(s): VITAMINB12, FOLATE, FERRITIN, TIBC, IRON, RETICCTPCT in the last 72 hours. Sepsis Labs: No results for input(s): PROCALCITON, LATICACIDVEN in the last 168 hours.  Recent Results (from the past  240 hour(s))  Urine culture     Status: Abnormal   Collection Time: 06/11/16  2:33 PM  Result Value Ref Range Status   Specimen Description URINE, CLEAN CATCH  Final   Special Requests NONE  Final   Culture >=100,000 COLONIES/mL ESCHERICHIA COLI (A)  Final   Report Status 06/14/2016 FINAL  Final   Organism ID, Bacteria ESCHERICHIA COLI (A)  Final      Susceptibility   Escherichia coli - MIC*    AMPICILLIN >=32 RESISTANT Resistant     CEFAZOLIN <=4 SENSITIVE Sensitive     CEFTRIAXONE <=1 SENSITIVE Sensitive     CIPROFLOXACIN <=0.25 SENSITIVE Sensitive     GENTAMICIN <=1 SENSITIVE Sensitive     IMIPENEM <=0.25 SENSITIVE Sensitive     NITROFURANTOIN <=16 SENSITIVE Sensitive     TRIMETH/SULFA <=20 SENSITIVE Sensitive     AMPICILLIN/SULBACTAM 16 INTERMEDIATE Intermediate     PIP/TAZO <=4 SENSITIVE Sensitive     Extended ESBL NEGATIVE Sensitive     * >=100,000 COLONIES/mL ESCHERICHIA COLI  MRSA PCR Screening     Status: None   Collection Time: 06/11/16  8:49 PM  Result Value Ref Range Status   MRSA by PCR NEGATIVE NEGATIVE Final    Comment:        The GeneXpert MRSA Assay (FDA approved for NASAL specimens only), is one component of a comprehensive MRSA colonization surveillance program. It is not intended to diagnose MRSA infection nor to guide or monitor treatment for MRSA  infections.   Culture, blood (routine x 2)     Status: Abnormal   Collection Time: 06/12/16  1:36 AM  Result Value Ref Range Status   Specimen Description BLOOD RIGHT ANTECUBITAL  Final   Special Requests IN PEDIATRIC BOTTLE 3ML  Final   Culture  Setup Time   Final    GRAM NEGATIVE RODS AEROBIC BOTTLE ONLY CRITICAL RESULT CALLED TO, READ BACK BY AND VERIFIED WITHKeturah Barre [facility] PHARMD [id number] 06/12/16 A BROWNING Performed at [facility] (A)  Final   Report Status 06/14/2016 FINAL  Final   Organism ID, Bacteria ESCHERICHIA COLI  Final      Susceptibility   Escherichia coli - MIC*    AMPICILLIN >=32 RESISTANT Resistant     CEFAZOLIN <=4 SENSITIVE Sensitive     CEFEPIME <=1 SENSITIVE Sensitive     CEFTAZIDIME <=1 SENSITIVE Sensitive     CEFTRIAXONE <=1 SENSITIVE Sensitive     CIPROFLOXACIN <=0.25 SENSITIVE Sensitive     GENTAMICIN <=1 SENSITIVE Sensitive     IMIPENEM <=0.25 SENSITIVE Sensitive     TRIMETH/SULFA <=20 SENSITIVE Sensitive     AMPICILLIN/SULBACTAM 16 INTERMEDIATE Intermediate     PIP/TAZO <=4 SENSITIVE Sensitive     Extended ESBL NEGATIVE Sensitive     * ESCHERICHIA COLI  Culture, blood (routine x 2)     Status: Abnormal   Collection Time: 06/12/16  1:36 AM  Result Value Ref Range Status   Specimen Description BLOOD RIGHT HAND  Final   Special Requests IN PEDIATRIC BOTTLE 2ML  Final   Culture  Setup Time   Final    GRAM NEGATIVE RODS AEROBIC BOTTLE ONLY CRITICAL RESULT CALLED TO, READ BACK BY AND VERIFIED WITH: D [facility] PHARMD 1840 06/12/16 A BROWNING    Culture (A)  Final    ESCHERICHIA COLI SUSCEPTIBILITIES PERFORMED ON PREVIOUS CULTURE WITHIN THE LAST 5 DAYS. Performed at [facility]    Report Status 06/14/2016 FINAL  Final  Blood Culture ID Panel (Reflexed)     Status: Abnormal   Collection Time: 06/12/16  1:36 AM  Result Value Ref Range Status   Enterococcus species NOT DETECTED NOT DETECTED Final   Listeria  monocytogenes NOT DETECTED NOT DETECTED Final   Staphylococcus species NOT DETECTED NOT DETECTED Final   Staphylococcus aureus NOT DETECTED NOT DETECTED Final   Streptococcus species NOT DETECTED NOT DETECTED Final   Streptococcus agalactiae NOT DETECTED NOT DETECTED Final   Streptococcus pneumoniae NOT DETECTED NOT DETECTED Final   Streptococcus pyogenes NOT DETECTED NOT DETECTED Final   Acinetobacter baumannii NOT DETECTED NOT DETECTED Final   Enterobacteriaceae species DETECTED (A) NOT DETECTED Final    Comment: CRITICAL RESULT CALLED TO, READ BACK BY AND VERIFIED WITH: D [place] PHARMD 1840 06/12/16 A BROWNING    Enterobacter cloacae complex NOT DETECTED NOT DETECTED Final   Escherichia coli DETECTED (A) NOT DETECTED Final    Comment: CRITICAL RESULT CALLED TO, READ BACK BY AND VERIFIED WITH: D [facility] PHARMD 1840 06/12/16 A BROWNING    Klebsiella oxytoca NOT DETECTED NOT DETECTED Final   Klebsiella pneumoniae NOT DETECTED NOT DETECTED Final   Proteus species NOT DETECTED NOT DETECTED Final   Serratia marcescens NOT DETECTED NOT DETECTED Final   Carbapenem resistance NOT DETECTED NOT DETECTED Final   Haemophilus influenzae NOT DETECTED NOT DETECTED Final   Neisseria meningitidis NOT DETECTED NOT DETECTED Final   Pseudomonas aeruginosa NOT DETECTED NOT DETECTED Final   Candida albicans NOT DETECTED NOT DETECTED Final   Candida glabrata NOT DETECTED NOT DETECTED Final   Candida krusei NOT DETECTED NOT DETECTED Final   Candida parapsilosis NOT DETECTED NOT DETECTED Final   Candida tropicalis NOT DETECTED NOT DETECTED Final    Comment: Performed at [facility]         Radiology Studies: No results found.      Scheduled Meds: . atorvastatin  20 mg Oral Daily  . calcium carbonate  2 tablet Oral BID WC  . ferumoxytol  510 mg Intravenous Weekly  . insulin aspart  0-15 Units Subcutaneous TID WC  . insulin aspart  0-5 Units Subcutaneous QHS  . insulin aspart  protamine- aspart  25 Units Subcutaneous BID WC  . insulin glargine  15 Units Subcutaneous Once  . magnesium chloride  1 tablet Oral BID  . meropenem (MERREM) IV  500 mg Intravenous Q12H  . metoprolol tartrate  25 mg Oral BID  . multivitamin with minerals  1 tablet Oral Daily  . sodium chloride flush  3 mL Intravenous Q12H   Continuous Infusions: . sodium chloride       LOS: 9 days    Time spent: 30 minutes    Bertil Brickey, MD [facility]    If 7PM-7AM, please contact night-coverage [email or web address] Password [email or web address] 06/20/2016, 9:15 AM

## 2016-06-20 NOTE — Progress Notes (Signed)
Subjective: good UOP, creat bumped up to 2.9 today.  Walking in the halls with P.T. , eating solid foods, no fevers or chills.  Repeat UA with +TNTC wbc's, no RBC's.  Repeat CT on 12/26 showed no real changed in bilat renal swelling (no hydro).     Objective: Vital signs in last 24 hours: Temp:  [97.4 F (36.3 C)-98.9 F (37.2 C)] 98.5 F (36.9 C) (12/29 1409) Pulse Rate:  [94-96] 95 (12/29 1409) Resp:  [18] 18 (12/29 1409) BP: (129-152)/(67-72) 129/67 (12/29 1409) SpO2:  [94 %-96 %] 95 % (12/29 1409) Weight:  [37.2 kg (82 lb 0.2 oz)] 37.2 kg (82 lb 0.2 oz) (12/29 0611) Weight change: 1.3 kg (2 lb 13.9 oz)  Intake/Output from previous day: 12/28 0701 - 12/29 0700 In: 700 [P.O.:600; IV Piggyback:100] Out: 2800 [Urine:2800] Intake/Output this shift: Total I/O In: 50 [IV Piggyback:50] Out: 575 [Urine:575]  Exam: Gen: small framed WF, tired, no distress Resp: bibasilar rales, no wheezing Cardio: regular rate and rhythm Abd: soft ntnd +CVAT bilat GU: Foley cath draining clear light yellow urine Ext: no edema NF Ox 3  CT abd 12/20, repeated 12/26 > bilat perirenal stranding and sig bilat renal swelling, mult nonobst stones bilat kidneys, no hydro, no ureteral stone, normal bladder UA 12/20 > many bact, large Hb, large LE/ neg nitrite, 30 prot, 0-5 rbc/ tntc wbc UCx/ Blood cx's > +E Coli   Assessment: 1 AKI - suspect due to severe bilat pyelo, sig diffuse bilat renal changes on CT. No obstruction. Creat no better. Great UOP.  Foley cath in.  2 Volume - may be dry from spont diuresis 3 Anemia - per primary 4 E.coli pyelo and bacteremia - repeat UA still w sig pyuria.  Changed to Meropenem for abx on 12/26 5 Hx CVA 6 HTN on MTP 25 bid; home acei on hold, bp's good 7 DNR  Plan:  Cont supportive care   Kelly Splinter MD [facility] pgr [phone]   06/20/2016, 3:16 PM       Lab Results:  Recent Labs  06/18/16 0451 06/19/16 0427  WBC 20.5* 20.0*   HGB 11.1* 11.2*  HCT 33.5* 34.3*  PLT 336 358   BMET:   Recent Labs  06/19/16 0427 06/20/16 0351  NA 137 133*  K 4.5 4.9  CL 100* 96*  CO2 25 24  GLUCOSE 242* 210*  BUN 58* 66*  CREATININE 2.52* 2.89*  CALCIUM 8.9 8.8*   No results for input(s): PTH in the last 72 hours. Iron Studies:  No results for input(s): IRON, TIBC, TRANSFERRIN, FERRITIN in the last 72 hours. Studies/Results: No results found.  Scheduled: . atorvastatin  20 mg Oral Daily  . calcium carbonate  2 tablet Oral BID WC  . ferumoxytol  510 mg Intravenous Weekly  . insulin aspart  0-15 Units Subcutaneous TID WC  . insulin aspart  0-5 Units Subcutaneous QHS  . insulin aspart protamine- aspart  25 Units Subcutaneous BID WC  . magnesium chloride  1 tablet Oral BID  . meropenem (MERREM) IV  500 mg Intravenous Q12H  . metoprolol tartrate  25 mg Oral BID  . multivitamin with minerals  1 tablet Oral Daily  . sodium chloride flush  3 mL Intravenous Q12H

## 2016-06-20 NOTE — Progress Notes (Signed)
Pharmacy Antibiotic Note  [name] Michael is a 55 y.o. female admitted on 06/11/2016 with sepsis secondary to E.coli pyelonephritis, bacteremia. She was initially started on Zosyn, then anttibiotics de-escalated per E.coli sensitivities in blood/urine. Patient with persistent fevers and elevated WBC, so antibiotics broadened to Meropenem on 12/26.   D#9 abx SCr rising, with CrCl ~ 11 ml/min  WBC 20K (12/28) Afebrile over past 24 hours  Plan: Continue Meropenem 500mg  IV q12h. Monitor renal function & patient progress.  Height: 4' 6.5" (138.4 cm) Weight: 82 lb 0.2 oz (37.2 kg) IBW/kg (Calculated) : 32.85  Temp (24hrs), Avg:98.3 F (36.8 C), Min:97.4 F (36.3 C), Max:98.9 F (37.2 C)   Recent Labs Lab 06/15/16 0826 06/16/16 0430 06/17/16 0816 06/17/16 0818 06/18/16 0451 06/19/16 0427 06/20/16 0351  WBC 18.8*  --   --  19.9* 20.5* 20.0*  --   CREATININE 2.78* 2.77* 2.74*  --  2.70* 2.52* 2.89*    Estimated Creatinine Clearance: 11.4 mL/min (by C-G formula based on SCr of 2.89 mg/dL (H)).    Allergies  Allergen Reactions  . Hydrocodone Nausea And Vomiting    Antimicrobials this admission: 12/21 Zosyn >> 12/24 12/20 Ceftriaxone >> 12/21 12/24 Ceftin (for bacteremia/UTI, MD not concern about colitis)>> 12/26 12/26 Meropenem >>  Dose adjustments this admission:  Microbiology results: 12/21 BCx: 2/2 E.coli (R = amp; I = unasyn)  12/20 UCx: > 100K E. coli (R = amp; I = unasyn)  12/20  MRSA PCR: neg  Thank you for allowing pharmacy to be a part of this patient's care.   Lindell Spar, PharmD, BCPS Pager: [phone] 06/20/2016 7:33 AM

## 2016-06-21 DIAGNOSIS — N10 Acute tubulo-interstitial nephritis: Secondary | ICD-10-CM

## 2016-06-21 DIAGNOSIS — D72829 Elevated white blood cell count, unspecified: Secondary | ICD-10-CM

## 2016-06-21 LAB — GLUCOSE, CAPILLARY
GLUCOSE-CAPILLARY: 192 mg/dL — AB (ref 65–99)
GLUCOSE-CAPILLARY: 203 mg/dL — AB (ref 65–99)
Glucose-Capillary: 114 mg/dL — ABNORMAL HIGH (ref 65–99)
Glucose-Capillary: 209 mg/dL — ABNORMAL HIGH (ref 65–99)

## 2016-06-21 LAB — CBC
HEMATOCRIT: 33 % — AB (ref 36.0–46.0)
Hemoglobin: 10.7 g/dL — ABNORMAL LOW (ref 12.0–15.0)
MCH: 26 pg (ref 26.0–34.0)
MCHC: 32.4 g/dL (ref 30.0–36.0)
MCV: 80.3 fL (ref 78.0–100.0)
Platelets: 413 10*3/uL — ABNORMAL HIGH (ref 150–400)
RBC: 4.11 MIL/uL (ref 3.87–5.11)
RDW: 14.8 % (ref 11.5–15.5)
WBC: 23.7 10*3/uL — AB (ref 4.0–10.5)

## 2016-06-21 LAB — BASIC METABOLIC PANEL
ANION GAP: 10 (ref 5–15)
BUN: 75 mg/dL — ABNORMAL HIGH (ref 6–20)
CO2: 23 mmol/L (ref 22–32)
Calcium: 8.7 mg/dL — ABNORMAL LOW (ref 8.9–10.3)
Chloride: 101 mmol/L (ref 101–111)
Creatinine, Ser: 2.83 mg/dL — ABNORMAL HIGH (ref 0.44–1.00)
GFR calc Af Amer: 21 mL/min — ABNORMAL LOW (ref >60)
GFR, EST NON AFRICAN AMERICAN: 18 mL/min — AB (ref >60)
Glucose, Bld: 103 mg/dL — ABNORMAL HIGH (ref 65–99)
POTASSIUM: 5.2 mmol/L — AB (ref 3.5–5.1)
SODIUM: 134 mmol/L — AB (ref 135–145)

## 2016-06-21 NOTE — Progress Notes (Signed)
PROGRESS NOTE  [name] Michael UQJ:[id number] DOB: 1961/02/13 DOA: 06/11/2016 PCP: Arnoldo Morale, MD  HPI/Recap of past 41 hours: 55 year old female past oral history diabetes mellitus, hyperlipidemia, hypertension and CVA admitted on 12/20 after having several days of nausea vomiting, diarrhea and weakness. Patient found to have acute kidney injury and sepsis secondary to Escherichia coli UTI causing pyelonephritis. Sepsis stabilized and patient's in about a change to by mouth, however with persistent fever and high white count, we started IV antibiotics and placed on meropenem. CT scan of abdomen and pelvis ruled out any kind of abscess and only showed nonobstructive stones. Urology consulted for possible obstructive uropathy, but at this time, they recommended no urology intervention. Was put on IV Lasix for final overload and then started developing worsening renal failure so nephrology consulted. Lasix stopped and gentle IV fluids started.   Over the last 24 hours, patient with stabilization of worsening creatinine and mild increase in white blood cell count although no fever. Patient herself states that she is feeling better. Denies any pain.   Assessment/Plan: Principal Problem:   Acute kidney injury [facility]): Appreciate nephrology help. Good urine output and creatinine starting to improve. Active Problems:   Diabetes [facility]): Uncontrolled with complications: L8L done last month at 10.9. Glipizide metformin on hold and currently on insulin 70/30. CBGs improved in the last 24 hours   Essential hypertension   GERD: PPI   Hypokalemia: Replacing   Hypocalcemia: Replacing   Hypophosphatemia: Replacing   Abdominal pain: Secondary to colitis, now resolved   Colitis: Resolved. CT scan evaluating causes of diarrhea no possible colitis.   CHF (congestive heart failure) (HCC) Sepsis secondary to Escherichia coli UTI/pyelonephritis: Continue IV antibiotics. No abscess. Open right but cell count  started to trend down. Fortunately no further fevers. Patient criteria for sepsis on admission given leukocytosis, fever, urinary source with tachycardia  Code Status: DO NOT RESUSCITATE   Family Communication: Patient declined for me to call any family   Disposition Plan: Here until renal function improves, likely for several more days  Consultants:  Urology  Nephrology   Procedures:   None  Antimicrobials:  IV Rocephin 12/20-12/22  IV Zosyn 12/22-12/24  IV Ceftin 12/24-12/26  IV meropenem 12/26-present   DVT prophylaxis:  SCDs   Objective: Vitals:   06/20/16 0611 06/20/16 1409 06/20/16 2156 06/21/16 0548  BP:  129/67 139/69 (!) 162/87  Pulse:  95 (!) 109 93  Resp:  18 18 18   Temp:  98.5 F (36.9 C) 98.7 F (37.1 C) 98.1 F (36.7 C)  TempSrc:  Oral Oral Oral  SpO2:  95% 93% 96%  Weight: 37.2 kg (82 lb 0.2 oz)   37.2 kg (82 lb 0.2 oz)  Height:        Intake/Output Summary (Last 24 hours) at 06/21/16 1416 Last data filed at 06/21/16 0600  Gross per 24 hour  Intake             1500 ml  Output             2225 ml  Net             -725 ml   Filed Weights   06/19/16 0442 06/20/16 0611 06/21/16 0548  Weight: 35.9 kg (79 lb 2.3 oz) 37.2 kg (82 lb 0.2 oz) 37.2 kg (82 lb 0.2 oz)    Exam:   General:  Alert and oriented 3, no acute distress   Cardiovascular: regular rate and rhythm, S1-S2   Respiratory:  clear to auscultation bilaterally   Abdomen: soft, nontender, nondistended, positive bowel sounds   Musculoskeletal: no clubbing or cyanosis, trace edema   Skin: no skin breaks, tears or lesions  Psychiatry: patient is appropriate, no evidence of psychoses    Data Reviewed: CBC:  Recent Labs Lab 06/15/16 0826 06/17/16 0818 06/18/16 0451 06/19/16 0427 06/21/16 0424  WBC 18.8* 19.9* 20.5* 20.0* 23.7*  NEUTROABS 16.3*  --   --  16.2*  --   HGB 12.1 11.5* 11.1* 11.2* 10.7*  HCT 35.2* 34.0* 33.5* 34.3* 33.0*  MCV 79.5 79.4 80.7 81.3 80.3    PLT 366 301 336 358 867*   Basic Metabolic Panel:  Recent Labs Lab 06/16/16 0430 06/17/16 0816 06/17/16 0818 06/18/16 0451 06/19/16 0427 06/20/16 0351 06/21/16 0424  NA 137 136  --  136 137 133* 134*  K 4.2 3.1*  --  4.2 4.5 4.9 5.2*  CL 103 100*  --  103 100* 96* 101  CO2 22 24  --  24 25 24 23   GLUCOSE 196* 347*  --  191* 242* 210* 103*  BUN 48* 48*  --  49* 58* 66* 75*  CREATININE 2.77* 2.74*  --  2.70* 2.52* 2.89* 2.83*  CALCIUM 7.2* 7.8*  --  8.2* 8.9 8.8* 8.7*  MG  --   --  1.6*  --   --  2.2  --   PHOS 3.4  --   --   --   --   --   --    GFR: Estimated Creatinine Clearance: 11.7 mL/min (by C-G formula based on SCr of 2.83 mg/dL (H)). Liver Function Tests:  Recent Labs Lab 06/16/16 0430 06/18/16 0451 06/19/16 0427  AST  --  17 15  ALT  --  17 17  ALKPHOS  --  95 90  BILITOT  --  0.6 0.6  PROT  --  6.6 6.8  ALBUMIN 1.9* 2.1* 2.2*   No results for input(s): LIPASE, AMYLASE in the last 168 hours. No results for input(s): AMMONIA in the last 168 hours. Coagulation Profile: No results for input(s): INR, PROTIME in the last 168 hours. Cardiac Enzymes: No results for input(s): CKTOTAL, CKMB, CKMBINDEX, TROPONINI in the last 168 hours. BNP (last 3 results) No results for input(s): PROBNP in the last 8760 hours. HbA1C: No results for input(s): HGBA1C in the last 72 hours. CBG:  Recent Labs Lab 06/20/16 1124 06/20/16 1652 06/20/16 2154 06/21/16 0726 06/21/16 1142  GLUCAP 268* 222* 79 114* 192*   Lipid Profile: No results for input(s): CHOL, HDL, LDLCALC, TRIG, CHOLHDL, LDLDIRECT in the last 72 hours. Thyroid Function Tests: No results for input(s): TSH, T4TOTAL, FREET4, T3FREE, THYROIDAB in the last 72 hours. Anemia Panel: No results for input(s): VITAMINB12, FOLATE, FERRITIN, TIBC, IRON, RETICCTPCT in the last 72 hours. Urine analysis:    Component Value Date/Time   COLORURINE STRAW (A) 06/19/2016 0911   APPEARANCEUR CLEAR 06/19/2016 0911    LABSPEC 1.003 (L) 06/19/2016 0911   PHURINE 7.0 06/19/2016 0911   GLUCOSEU >=500 (A) 06/19/2016 0911   HGBUR MODERATE (A) 06/19/2016 0911   BILIRUBINUR NEGATIVE 06/19/2016 0911   BILIRUBINUR n 05/25/2012 1202   KETONESUR NEGATIVE 06/19/2016 0911   PROTEINUR NEGATIVE 06/19/2016 0911   UROBILINOGEN 0.2 05/25/2012 1202   UROBILINOGEN 0.2 03/29/2011 0131   NITRITE NEGATIVE 06/19/2016 0911   LEUKOCYTESUR LARGE (A) 06/19/2016 0911   Sepsis Labs: @LABRCNTIP (procalcitonin:4,lacticidven:4)  ) Recent Results (from the past 240 hour(s))  Urine culture  Status: Abnormal   Collection Time: 06/11/16  2:33 PM  Result Value Ref Range Status   Specimen Description URINE, CLEAN CATCH  Final   Special Requests NONE  Final   Culture >=100,000 COLONIES/mL ESCHERICHIA COLI (A)  Final   Report Status 06/14/2016 FINAL  Final   Organism ID, Bacteria ESCHERICHIA COLI (A)  Final      Susceptibility   Escherichia coli - MIC*    AMPICILLIN >=32 RESISTANT Resistant     CEFAZOLIN <=4 SENSITIVE Sensitive     CEFTRIAXONE <=1 SENSITIVE Sensitive     CIPROFLOXACIN <=0.25 SENSITIVE Sensitive     GENTAMICIN <=1 SENSITIVE Sensitive     IMIPENEM <=0.25 SENSITIVE Sensitive     NITROFURANTOIN <=16 SENSITIVE Sensitive     TRIMETH/SULFA <=20 SENSITIVE Sensitive     AMPICILLIN/SULBACTAM 16 INTERMEDIATE Intermediate     PIP/TAZO <=4 SENSITIVE Sensitive     Extended ESBL NEGATIVE Sensitive     * >=100,000 COLONIES/mL ESCHERICHIA COLI  MRSA PCR Screening     Status: None   Collection Time: 06/11/16  8:49 PM  Result Value Ref Range Status   MRSA by PCR NEGATIVE NEGATIVE Final    Comment:        The GeneXpert MRSA Assay (FDA approved for NASAL specimens only), is one component of a comprehensive MRSA colonization surveillance program. It is not intended to diagnose MRSA infection nor to guide or monitor treatment for MRSA infections.   Culture, blood (routine x 2)     Status: Abnormal   Collection Time:  06/12/16  1:36 AM  Result Value Ref Range Status   Specimen Description BLOOD RIGHT ANTECUBITAL  Final   Special Requests IN PEDIATRIC BOTTLE 3ML  Final   Culture  Setup Time   Final    GRAM NEGATIVE RODS AEROBIC BOTTLE ONLY CRITICAL RESULT CALLED TO, READ BACK BY AND VERIFIED WITH[facility] PHARMD [id number] 06/12/16 A BROWNING Performed at [place] (A)  Final   Report Status 06/14/2016 FINAL  Final   Organism ID, Bacteria ESCHERICHIA COLI  Final      Susceptibility   Escherichia coli - MIC*    AMPICILLIN >=32 RESISTANT Resistant     CEFAZOLIN <=4 SENSITIVE Sensitive     CEFEPIME <=1 SENSITIVE Sensitive     CEFTAZIDIME <=1 SENSITIVE Sensitive     CEFTRIAXONE <=1 SENSITIVE Sensitive     CIPROFLOXACIN <=0.25 SENSITIVE Sensitive     GENTAMICIN <=1 SENSITIVE Sensitive     IMIPENEM <=0.25 SENSITIVE Sensitive     TRIMETH/SULFA <=20 SENSITIVE Sensitive     AMPICILLIN/SULBACTAM 16 INTERMEDIATE Intermediate     PIP/TAZO <=4 SENSITIVE Sensitive     Extended ESBL NEGATIVE Sensitive     * ESCHERICHIA COLI  Culture, blood (routine x 2)     Status: Abnormal   Collection Time: 06/12/16  1:36 AM  Result Value Ref Range Status   Specimen Description BLOOD RIGHT HAND  Final   Special Requests IN PEDIATRIC BOTTLE 2ML  Final   Culture  Setup Time   Final    GRAM NEGATIVE RODS AEROBIC BOTTLE ONLY CRITICAL RESULT CALLED TO, READ BACK BY AND VERIFIED WITH: D [facility] PHARMD 1840 06/12/16 A BROWNING    Culture (A)  Final    ESCHERICHIA COLI SUSCEPTIBILITIES PERFORMED ON PREVIOUS CULTURE WITHIN THE LAST 5 DAYS. Performed at [facility]    Report Status 06/14/2016 FINAL  Final  Blood Culture ID Panel (Reflexed)  Status: Abnormal   Collection Time: 06/12/16  1:36 AM  Result Value Ref Range Status   Enterococcus species NOT DETECTED NOT DETECTED Final   Listeria monocytogenes NOT DETECTED NOT DETECTED Final   Staphylococcus species NOT DETECTED NOT  DETECTED Final   Staphylococcus aureus NOT DETECTED NOT DETECTED Final   Streptococcus species NOT DETECTED NOT DETECTED Final   Streptococcus agalactiae NOT DETECTED NOT DETECTED Final   Streptococcus pneumoniae NOT DETECTED NOT DETECTED Final   Streptococcus pyogenes NOT DETECTED NOT DETECTED Final   Acinetobacter baumannii NOT DETECTED NOT DETECTED Final   Enterobacteriaceae species DETECTED (A) NOT DETECTED Final    Comment: CRITICAL RESULT CALLED TO, READ BACK BY AND VERIFIED WITH: D [facility] PHARMD 1840 06/12/16 A BROWNING    Enterobacter cloacae complex NOT DETECTED NOT DETECTED Final   Escherichia coli DETECTED (A) NOT DETECTED Final    Comment: CRITICAL RESULT CALLED TO, READ BACK BY AND VERIFIED WITH: D [facility] PHARMD 1840 06/12/16 A BROWNING    Klebsiella oxytoca NOT DETECTED NOT DETECTED Final   Klebsiella pneumoniae NOT DETECTED NOT DETECTED Final   Proteus species NOT DETECTED NOT DETECTED Final   Serratia marcescens NOT DETECTED NOT DETECTED Final   Carbapenem resistance NOT DETECTED NOT DETECTED Final   Haemophilus influenzae NOT DETECTED NOT DETECTED Final   Neisseria meningitidis NOT DETECTED NOT DETECTED Final   Pseudomonas aeruginosa NOT DETECTED NOT DETECTED Final   Candida albicans NOT DETECTED NOT DETECTED Final   Candida glabrata NOT DETECTED NOT DETECTED Final   Candida krusei NOT DETECTED NOT DETECTED Final   Candida parapsilosis NOT DETECTED NOT DETECTED Final   Candida tropicalis NOT DETECTED NOT DETECTED Final    Comment: Performed at [facility]  Culture, blood (Routine X 2) w Reflex to ID Panel     Status: None (Preliminary result)   Collection Time: 06/20/16 10:20 AM  Result Value Ref Range Status   Specimen Description BLOOD RIGHT ARM  Final   Special Requests BOTTLES DRAWN AEROBIC AND ANAEROBIC 5CC  Final   Culture   Final    NO GROWTH < 24 HOURS Performed at [facility]    Report Status PENDING  Incomplete  Culture, blood  (Routine X 2) w Reflex to ID Panel     Status: None (Preliminary result)   Collection Time: 06/20/16 10:22 AM  Result Value Ref Range Status   Specimen Description BLOOD LEFT ARM  Final   Special Requests BOTTLES DRAWN AEROBIC AND ANAEROBIC 10CC  Final   Culture   Final    NO GROWTH < 24 HOURS Performed at [facility]    Report Status PENDING  Incomplete      Studies: No results found.  Scheduled Meds: . atorvastatin  20 mg Oral Daily  . calcium carbonate  2 tablet Oral BID WC  . ferumoxytol  510 mg Intravenous Weekly  . insulin aspart  0-15 Units Subcutaneous TID WC  . insulin aspart  0-5 Units Subcutaneous QHS  . insulin aspart protamine- aspart  25 Units Subcutaneous BID WC  . magnesium chloride  1 tablet Oral BID  . meropenem (MERREM) IV  500 mg Intravenous Q12H  . metoprolol tartrate  25 mg Oral BID  . multivitamin with minerals  1 tablet Oral Daily  . sodium chloride flush  3 mL Intravenous Q12H    Continuous Infusions: . sodium chloride 75 mL/hr at 06/21/16 0154     LOS: 10 days    Annita Brod, MD [facility] Pager [phone]  If 7PM-7AM, please contact night-coverage [email or web address] Password [email or web address] 06/21/2016, 2:16 PM

## 2016-06-21 NOTE — Progress Notes (Signed)
Subjective: no c/o's. Creat down slighlty today  Objective: Vital signs in last 24 hours: Temp:  [98.1 F (36.7 C)-98.7 F (37.1 C)] 98.5 F (36.9 C) (12/30 1514) Pulse Rate:  [93-109] 99 (12/30 1514) Resp:  [17-18] 17 (12/30 1514) BP: (139-162)/(66-87) 157/66 (12/30 1514) SpO2:  [93 %-98 %] 98 % (12/30 1514) Weight:  [37.2 kg (82 lb 0.2 oz)] 37.2 kg (82 lb 0.2 oz) (12/30 0548) Weight change: 0 kg (0 lb)  Intake/Output from previous day: 12/29 0701 - 12/30 0700 In: 1921.3 [P.O.:370; I.V.:1451.3; IV Piggyback:100] Out: 2800 [Urine:2800] Intake/Output this shift: Total I/O In: 800 [I.V.:750; IV Piggyback:50] Out: -   Exam: Gen: small framed WF, tired, no distress Resp: basilar rales, no wheezing/ rhonchi Cardio: regular rate and rhythm Abd: soft ntnd +CVAT bilat GU: Foley cath draining clear light yellow urine Ext: no edema NF Ox 3  CT abd 12/20, repeated 12/26 > bilat perirenal stranding and sig bilat renal swelling, mult nonobst stones bilat kidneys, no hydro, no ureteral stone, normal bladder UA 12/20 > many bact, large Hb, large LE/ neg nitrite, 30 prot, 0-5 rbc/ tntc wbc UCx/ Blood cx's > +E Coli   Assessment: 1 AKI - suspect due to severe bilat pyelo, sig diffuse bilat renal changes on CT. No obstruction. Creat no better. Good UOP. Cont IVF"s.  2 Volume - getting IVF"s now 3 Anemia - per primary 4 E.coli pyelo and bacteremia - repeat UA still w sig pyuria.  Upgraded to Meropenem on 12/26 5 Hx CVA 6 HTN on MTP 25 bid; home acei on hold, bp's good 7 DNR  Plan: supportive care, will follow   Kelly Splinter MD [facility] pgr [phone]   06/21/2016, 5:53 PM       Lab Results:  Recent Labs  06/19/16 0427 06/21/16 0424  WBC 20.0* 23.7*  HGB 11.2* 10.7*  HCT 34.3* 33.0*  PLT 358 413*   BMET:   Recent Labs  06/20/16 0351 06/21/16 0424  NA 133* 134*  K 4.9 5.2*  CL 96* 101  CO2 24 23  GLUCOSE 210* 103*  BUN 66* 75*   CREATININE 2.89* 2.83*  CALCIUM 8.8* 8.7*   No results for input(s): PTH in the last 72 hours. Iron Studies:  No results for input(s): IRON, TIBC, TRANSFERRIN, FERRITIN in the last 72 hours. Studies/Results: No results found.  Scheduled: . atorvastatin  20 mg Oral Daily  . calcium carbonate  2 tablet Oral BID WC  . ferumoxytol  510 mg Intravenous Weekly  . insulin aspart  0-15 Units Subcutaneous TID WC  . insulin aspart  0-5 Units Subcutaneous QHS  . insulin aspart protamine- aspart  25 Units Subcutaneous BID WC  . magnesium chloride  1 tablet Oral BID  . meropenem (MERREM) IV  500 mg Intravenous Q12H  . metoprolol tartrate  25 mg Oral BID  . multivitamin with minerals  1 tablet Oral Daily  . sodium chloride flush  3 mL Intravenous Q12H

## 2016-06-22 DIAGNOSIS — N3000 Acute cystitis without hematuria: Secondary | ICD-10-CM

## 2016-06-22 DIAGNOSIS — I5032 Chronic diastolic (congestive) heart failure: Secondary | ICD-10-CM

## 2016-06-22 DIAGNOSIS — R509 Fever, unspecified: Secondary | ICD-10-CM

## 2016-06-22 LAB — BASIC METABOLIC PANEL
ANION GAP: 11 (ref 5–15)
BUN: 68 mg/dL — AB (ref 6–20)
CO2: 21 mmol/L — ABNORMAL LOW (ref 22–32)
Calcium: 8.3 mg/dL — ABNORMAL LOW (ref 8.9–10.3)
Chloride: 102 mmol/L (ref 101–111)
Creatinine, Ser: 2.58 mg/dL — ABNORMAL HIGH (ref 0.44–1.00)
GFR, EST AFRICAN AMERICAN: 23 mL/min — AB (ref >60)
GFR, EST NON AFRICAN AMERICAN: 20 mL/min — AB (ref >60)
Glucose, Bld: 256 mg/dL — ABNORMAL HIGH (ref 65–99)
POTASSIUM: 5.5 mmol/L — AB (ref 3.5–5.1)
SODIUM: 134 mmol/L — AB (ref 135–145)

## 2016-06-22 LAB — GLUCOSE, CAPILLARY
GLUCOSE-CAPILLARY: 119 mg/dL — AB (ref 65–99)
GLUCOSE-CAPILLARY: 190 mg/dL — AB (ref 65–99)
GLUCOSE-CAPILLARY: 70 mg/dL (ref 65–99)
Glucose-Capillary: 230 mg/dL — ABNORMAL HIGH (ref 65–99)

## 2016-06-22 LAB — CBC
HEMATOCRIT: 34.5 % — AB (ref 36.0–46.0)
Hemoglobin: 11.1 g/dL — ABNORMAL LOW (ref 12.0–15.0)
MCH: 27 pg (ref 26.0–34.0)
MCHC: 32.2 g/dL (ref 30.0–36.0)
MCV: 83.9 fL (ref 78.0–100.0)
Platelets: 453 10*3/uL — ABNORMAL HIGH (ref 150–400)
RBC: 4.11 MIL/uL (ref 3.87–5.11)
RDW: 15.3 % (ref 11.5–15.5)
WBC: 20.6 10*3/uL — AB (ref 4.0–10.5)

## 2016-06-22 MED ORDER — INSULIN ASPART PROT & ASPART (70-30 MIX) 100 UNIT/ML ~~LOC~~ SUSP
28.0000 [IU] | Freq: Two times a day (BID) | SUBCUTANEOUS | Status: DC
  Administered 2016-06-22 – 2016-06-28 (×11): 28 [IU] via SUBCUTANEOUS

## 2016-06-22 NOTE — Progress Notes (Signed)
PROGRESS NOTE  [name] Michael YKZ:[id number] DOB: Apr 29, 1961 DOA: 06/11/2016 PCP: Arnoldo Morale, MD  HPI/Recap of past 37 hours: 55 year old female past oral history diabetes mellitus, hyperlipidemia, hypertension and CVA admitted on 12/20 after having several days of nausea vomiting, diarrhea and weakness. Patient found to have acute kidney injury and sepsis secondary to Escherichia coli UTI causing pyelonephritis. Sepsis stabilized and patient's in about a change to by mouth, however with persistent fever and high white count, we started IV antibiotics and placed on meropenem. CT scan of abdomen and pelvis ruled out any kind of abscess and only showed nonobstructive stones. Urology consulted for possible obstructive uropathy, but at this time, they recommended no urology intervention. Was put on IV Lasix for final overload and then started developing worsening renal failure so nephrology consulted. Lasix stopped and gentle IV fluids started.   This morning, creatinine continues to slightly trend downward. White count as well. However, patient spiked true fever overnight. She herself has no complaints. Feeling okay. No respiratory complaints.   Assessment/Plan: Principal Problem:   Acute kidney injury [facility]): Appreciate nephrology help. Good urine output and creatinine slowly trending downward Active Problems:   Diabetes ([place]): Uncontrolled with complications: L3J done last month at 10.9. Glipizide metformin on hold and currently on insulin 70/30. CBGs trending upward. Have increased 70/30-28 units twice a day    Essential hypertension   GERD: PPI   Hypokalemia: Replacing   Hypocalcemia: Replacing   Hypophosphatemia: Replacing   Abdominal pain: Secondary to colitis, now resolved   Colitis: Resolved. CT scan evaluating causes of diarrhea no possible colitis.   CHF (congestive heart failure) (HCC) Sepsis secondary to Escherichia coli UTI/pyelonephritis: Continue IV antibiotics. No abscess.  White blood cell count starting to come down. Given further fevers, she otherwise clinically looks okay. She is had a Foley catheter in now for 8 days so I have discontinued this. We'll hold off on adding new antibiotics to vancomycin unless her white count worsens or she continues to spike fevers. Patient met criteria for sepsis on admission given leukocytosis, fever, urinary source with tachycardia. Have sent urine back off for culture  Code Status: DO NOT RESUSCITATE   Family Communication: Patient declined for me to call any family   Disposition Plan: Here until renal function improves and infection fully resolved  Consultants:  Urology  Nephrology   Procedures:   None  Antimicrobials:  IV Rocephin 12/20-12/22  IV Zosyn 12/22-12/24  IV Ceftin 12/24-12/26  IV meropenem 12/26-present   DVT prophylaxis:  SCDs   Objective: Vitals:   06/21/16 0548 06/21/16 1514 06/21/16 2129 06/22/16 0624  BP: (!) 162/87 (!) 157/66 (!) 137/55 (!) 153/88  Pulse: 93 99 (!) 112 (!) 107  Resp: 18 17 18 18   Temp: 98.1 F (36.7 C) 98.5 F (36.9 C) (!) 101.1 F (38.4 C) 100.2 F (37.9 C)  TempSrc: Oral Oral Oral Oral  SpO2: 96% 98% 93% 95%  Weight: 37.2 kg (82 lb 0.2 oz)   37.3 kg (82 lb 3.7 oz)  Height:        Intake/Output Summary (Last 24 hours) at 06/22/16 1305 Last data filed at 06/22/16 0600  Gross per 24 hour  Intake             1850 ml  Output             3150 ml  Net            -1300 ml   [employer]  06/20/16 0611 06/21/16 0548 06/22/16 0624  Weight: 37.2 kg (82 lb 0.2 oz) 37.2 kg (82 lb 0.2 oz) 37.3 kg (82 lb 3.7 oz)    Exam:   General:  Alert and oriented 3, no acute distress   Cardiovascular: regular rate and rhythm, S1-S2   Respiratory: clear to auscultation bilaterally   Abdomen: soft, nontender, nondistended, positive bowel sounds   Musculoskeletal: no clubbing or cyanosis, trace edema   Skin: no skin breaks, tears or lesions  Psychiatry: patient  is appropriate, no evidence of psychoses    Data Reviewed: CBC:  Recent Labs Lab 06/17/16 0818 06/18/16 0451 06/19/16 0427 06/21/16 0424 06/22/16 0941  WBC 19.9* 20.5* 20.0* 23.7* 20.6*  NEUTROABS  --   --  16.2*  --   --   HGB 11.5* 11.1* 11.2* 10.7* 11.1*  HCT 34.0* 33.5* 34.3* 33.0* 34.5*  MCV 79.4 80.7 81.3 80.3 83.9  PLT 301 336 358 413* 696*   Basic Metabolic Panel:  Recent Labs Lab 06/16/16 0430  06/17/16 0818 06/18/16 0451 06/19/16 0427 06/20/16 0351 06/21/16 0424 06/22/16 0941  NA 137  < >  --  136 137 133* 134* 134*  K 4.2  < >  --  4.2 4.5 4.9 5.2* 5.5*  CL 103  < >  --  103 100* 96* 101 102  CO2 22  < >  --  24 25 24 23  21*  GLUCOSE 196*  < >  --  191* 242* 210* 103* 256*  BUN 48*  < >  --  49* 58* 66* 75* 68*  CREATININE 2.77*  < >  --  2.70* 2.52* 2.89* 2.83* 2.58*  CALCIUM 7.2*  < >  --  8.2* 8.9 8.8* 8.7* 8.3*  MG  --   --  1.6*  --   --  2.2  --   --   PHOS 3.4  --   --   --   --   --   --   --   < > = values in this interval not displayed. GFR: Estimated Creatinine Clearance: 12.8 mL/min (by C-G formula based on SCr of 2.58 mg/dL (H)). Liver Function Tests:  Recent Labs Lab 06/16/16 0430 06/18/16 0451 06/19/16 0427  AST  --  17 15  ALT  --  17 17  ALKPHOS  --  95 90  BILITOT  --  0.6 0.6  PROT  --  6.6 6.8  ALBUMIN 1.9* 2.1* 2.2*   No results for input(s): LIPASE, AMYLASE in the last 168 hours. No results for input(s): AMMONIA in the last 168 hours. Coagulation Profile: No results for input(s): INR, PROTIME in the last 168 hours. Cardiac Enzymes: No results for input(s): CKTOTAL, CKMB, CKMBINDEX, TROPONINI in the last 168 hours. BNP (last 3 results) No results for input(s): PROBNP in the last 8760 hours. HbA1C: No results for input(s): HGBA1C in the last 72 hours. CBG:  Recent Labs Lab 06/21/16 1142 06/21/16 1711 06/21/16 2132 06/22/16 0838 06/22/16 1229  GLUCAP 192* 203* 209* 119* 230*   Lipid Profile: No results for  input(s): CHOL, HDL, LDLCALC, TRIG, CHOLHDL, LDLDIRECT in the last 72 hours. Thyroid Function Tests: No results for input(s): TSH, T4TOTAL, FREET4, T3FREE, THYROIDAB in the last 72 hours. Anemia Panel: No results for input(s): VITAMINB12, FOLATE, FERRITIN, TIBC, IRON, RETICCTPCT in the last 72 hours. Urine analysis:    Component Value Date/Time   COLORURINE STRAW (A) 06/19/2016 0911   APPEARANCEUR CLEAR 06/19/2016 0911   LABSPEC  1.003 (L) 06/19/2016 0911   PHURINE 7.0 06/19/2016 0911   GLUCOSEU >=500 (A) 06/19/2016 0911   HGBUR MODERATE (A) 06/19/2016 0911   BILIRUBINUR NEGATIVE 06/19/2016 0911   BILIRUBINUR n 05/25/2012 1202   KETONESUR NEGATIVE 06/19/2016 0911   PROTEINUR NEGATIVE 06/19/2016 0911   UROBILINOGEN 0.2 05/25/2012 1202   UROBILINOGEN 0.2 03/29/2011 0131   NITRITE NEGATIVE 06/19/2016 0911   LEUKOCYTESUR LARGE (A) 06/19/2016 0911   Sepsis Labs: @LABRCNTIP (procalcitonin:4,lacticidven:4)  ) Recent Results (from the past 240 hour(s))  Culture, blood (Routine X 2) w Reflex to ID Panel     Status: None (Preliminary result)   Collection Time: 06/20/16 10:20 AM  Result Value Ref Range Status   Specimen Description BLOOD RIGHT ARM  Final   Special Requests BOTTLES DRAWN AEROBIC AND ANAEROBIC 5CC  Final   Culture   Final    NO GROWTH 1 DAY Performed at [facility]    Report Status PENDING  Incomplete  Culture, blood (Routine X 2) w Reflex to ID Panel     Status: None (Preliminary result)   Collection Time: 06/20/16 10:22 AM  Result Value Ref Range Status   Specimen Description BLOOD LEFT ARM  Final   Special Requests BOTTLES DRAWN AEROBIC AND ANAEROBIC 10CC  Final   Culture   Final    NO GROWTH 1 DAY Performed at [facility]    Report Status PENDING  Incomplete      Studies: No results found.  Scheduled Meds: . atorvastatin  20 mg Oral Daily  . calcium carbonate  2 tablet Oral BID WC  . insulin aspart  0-15 Units Subcutaneous TID WC  .  insulin aspart  0-5 Units Subcutaneous QHS  . insulin aspart protamine- aspart  25 Units Subcutaneous BID WC  . magnesium chloride  1 tablet Oral BID  . meropenem (MERREM) IV  500 mg Intravenous Q12H  . metoprolol tartrate  25 mg Oral BID  . multivitamin with minerals  1 tablet Oral Daily  . sodium chloride flush  3 mL Intravenous Q12H    Continuous Infusions: . sodium chloride 75 mL/hr at 06/22/16 0731     LOS: 11 days    Annita Brod, MD [facility] Pager [phone]  If 7PM-7AM, please contact night-coverage [email or web address] Password [email or web address] 06/22/2016, 1:05 PM

## 2016-06-22 NOTE — Progress Notes (Signed)
Subjective: no c/o's. Creat down 2.5 today, good UOP.  No n/v, eating solid foods, no complaints.   Objective: Vital signs in last 24 hours: Temp:  [99.8 F (37.7 C)-101.1 F (38.4 C)] 99.8 F (37.7 C) (12/31 1455) Pulse Rate:  [97-112] 97 (12/31 1455) Resp:  [17-18] 17 (12/31 1455) BP: (137-153)/(55-88) 153/82 (12/31 1455) SpO2:  [93 %-99 %] 99 % (12/31 1455) Weight:  [37.3 kg (82 lb 3.7 oz)] 37.3 kg (82 lb 3.7 oz) (12/31 0624) Weight change: 0.1 kg (3.5 oz)  Intake/Output from previous day: 12/30 0701 - 12/31 0700 In: 1900 [I.V.:1800; IV Piggyback:100] Out: 3150 [Urine:3150] Intake/Output this shift: Total I/O In: 725 [I.V.:675; IV Piggyback:50] Out: 500 [Urine:500]  Exam: Gen: small framed WF, tired Resp: clear bilat to bases Cardio: regular rate and rhythm Abd: soft ntnd +CVAT bilat GU: Foley cath draining clear light yellow urine Ext: no edema LEs or UE's NF Ox 3  CT abd 12/20, repeated 12/26 > bilat perirenal stranding and sig bilat renal swelling, mult nonobst stones bilat kidneys, no hydro, no ureteral stone, normal bladder UA 12/20 > many bact, large Hb, large LE/ neg nitrite, 30 prot, 0-5 rbc/ tntc wbc UCx/ Blood cx's > +E Coli   Assessment: 1 AKI - due to severe bilat pyelo, has sig diffuse bilat renal changes (swelling) by CT. No obstruction. Creat down some with IVF's and/or possibly started to heal from infection.  No uremic symptoms. Will follow.  2 Volume - getting IVF"s now 3 Anemia - per primary 4 E.coli pyelo and bacteremia - repeat UA still w sig pyuria.  Upgraded to Meropenem on 12/26 5 Hx CVA 6 HTN on MTP 25 bid; home acei on hold, bp's good 7 DNR  Plan: as above   Kelly Splinter MD [employer] pgr [phone]   06/22/2016, 4:52 PM       Lab Results:  Recent Labs  06/21/16 0424 06/22/16 0941  WBC 23.7* 20.6*  HGB 10.7* 11.1*  HCT 33.0* 34.5*  PLT 413* 453*   BMET:   Recent Labs  06/21/16 0424  06/22/16 0941  NA 134* 134*  K 5.2* 5.5*  CL 101 102  CO2 23 21*  GLUCOSE 103* 256*  BUN 75* 68*  CREATININE 2.83* 2.58*  CALCIUM 8.7* 8.3*   No results for input(s): PTH in the last 72 hours. Iron Studies:  No results for input(s): IRON, TIBC, TRANSFERRIN, FERRITIN in the last 72 hours. Studies/Results: No results found.  Scheduled: . atorvastatin  20 mg Oral Daily  . calcium carbonate  2 tablet Oral BID WC  . insulin aspart  0-15 Units Subcutaneous TID WC  . insulin aspart  0-5 Units Subcutaneous QHS  . insulin aspart protamine- aspart  28 Units Subcutaneous BID WC  . magnesium chloride  1 tablet Oral BID  . meropenem (MERREM) IV  500 mg Intravenous Q12H  . metoprolol tartrate  25 mg Oral BID  . multivitamin with minerals  1 tablet Oral Daily  . sodium chloride flush  3 mL Intravenous Q12H

## 2016-06-23 DIAGNOSIS — N390 Urinary tract infection, site not specified: Secondary | ICD-10-CM

## 2016-06-23 LAB — CBC
HEMATOCRIT: 34.4 % — AB (ref 36.0–46.0)
Hemoglobin: 11.1 g/dL — ABNORMAL LOW (ref 12.0–15.0)
MCH: 26.9 pg (ref 26.0–34.0)
MCHC: 32.3 g/dL (ref 30.0–36.0)
MCV: 83.5 fL (ref 78.0–100.0)
Platelets: 476 10*3/uL — ABNORMAL HIGH (ref 150–400)
RBC: 4.12 MIL/uL (ref 3.87–5.11)
RDW: 15.3 % (ref 11.5–15.5)
WBC: 19.8 10*3/uL — ABNORMAL HIGH (ref 4.0–10.5)

## 2016-06-23 LAB — URINE CULTURE: CULTURE: NO GROWTH

## 2016-06-23 LAB — BASIC METABOLIC PANEL
ANION GAP: 10 (ref 5–15)
BUN: 68 mg/dL — ABNORMAL HIGH (ref 6–20)
CALCIUM: 9.1 mg/dL (ref 8.9–10.3)
CHLORIDE: 104 mmol/L (ref 101–111)
CO2: 22 mmol/L (ref 22–32)
Creatinine, Ser: 2.55 mg/dL — ABNORMAL HIGH (ref 0.44–1.00)
GFR calc Af Amer: 23 mL/min — ABNORMAL LOW (ref >60)
GFR calc non Af Amer: 20 mL/min — ABNORMAL LOW (ref >60)
GLUCOSE: 94 mg/dL (ref 65–99)
POTASSIUM: 5.6 mmol/L — AB (ref 3.5–5.1)
Sodium: 136 mmol/L (ref 135–145)

## 2016-06-23 LAB — GLUCOSE, CAPILLARY
GLUCOSE-CAPILLARY: 179 mg/dL — AB (ref 65–99)
Glucose-Capillary: 220 mg/dL — ABNORMAL HIGH (ref 65–99)
Glucose-Capillary: 240 mg/dL — ABNORMAL HIGH (ref 65–99)
Glucose-Capillary: 82 mg/dL (ref 65–99)
Glucose-Capillary: 55 mg/dL — ABNORMAL LOW (ref 65–99)

## 2016-06-23 MED ORDER — HYDRALAZINE HCL 20 MG/ML IJ SOLN: 10.0000 mg | Freq: Four times a day (QID) | INTRAMUSCULAR | Status: DC | PRN

## 2016-06-23 MED ORDER — SODIUM POLYSTYRENE SULFONATE 15 GM/60ML PO SUSP
15.0000 g | Freq: Once | ORAL | Status: AC
  Administered 2016-06-23: 15 g via ORAL
  Filled 2016-06-23: qty 60

## 2016-06-23 NOTE — Progress Notes (Signed)
[facility] Rounding Note  Subjective:  No c/o's.  No foley, says voiding in toilet (I/O records therefore incomplete) and having incontinence in the bed as well So not sure of actual UOP No real change in creatinine/GFR (around 18-20)  Objective: Vital signs in last 24 hours: Temp:  [98 F (36.7 C)-98.8 F (37.1 C)] 98.8 F (37.1 C) (01/01 1400) Pulse Rate:  [78-101] 89 (01/01 0553) Resp:  [18] 18 (01/01 1400) BP: (159-172)/(69-82) 162/76 (01/01 1400) SpO2:  [93 %-99 %] 96 % (01/01 1400) Weight:  [36.6 kg (80 lb 11 oz)] 36.6 kg (80 lb 11 oz) (01/01 0553) Weight change: -0.7 kg (-1 lb 8.7 oz)  Intake/Output from previous day: 12/31 0701 - 01/01 0700 In: 9381 [P.O.:240; I.V.:675; IV Piggyback:100] Out: 500 [Urine:500] Intake/Output this shift: Total I/O In: 120 [P.O.:120] Out: -   Exam: Gen: small framed WF Soft spoken Lungs clear Regular rhythm, normal S1S2 No S3 or murmur Soft NT abd. Still with some CVAT. No edema LEs or UE's NF Ox 3 No asterixus  CT abd 12/20, repeated 12/26 > bilat perirenal stranding and sig bilat renal swelling, mult nonobst stones bilat kidneys, no hydro, no ureteral stone, normal bladder UA 12/20 > many bact, large Hb, large LE/ neg nitrite, 30 prot, 0-5 rbc/ tntc wbc UCx/ Blood cx's > +E Coli  Lab Results:  Recent Labs  06/22/16 0941 06/23/16 0510  WBC 20.6* 19.8*  HGB 11.1* 11.1*  HCT 34.5* 34.4*  PLT 453* 476*    Recent Labs  06/22/16 0941 06/23/16 0510  NA 134* 136  K 5.5* 5.6*  CL 102 104  CO2 21* 22  GLUCOSE 256* 94  BUN 68* 68*  CREATININE 2.58* 2.55*  CALCIUM 8.3* 9.1    Scheduled Medications: . atorvastatin  20 mg Oral Daily  . calcium carbonate  2 tablet Oral BID WC  . insulin aspart  0-15 Units Subcutaneous TID WC  . insulin aspart  0-5 Units Subcutaneous QHS  . insulin aspart protamine- aspart  28 Units Subcutaneous BID WC  . magnesium chloride  1 tablet Oral BID  . meropenem (MERREM) IV  500 mg Intravenous Q12H   . metoprolol tartrate  25 mg Oral BID  . multivitamin with minerals  1 tablet Oral Daily  . sodium chloride flush  3 mL Intravenous Q12H   . sodium chloride 75 mL/hr at 06/23/16 1158   Assessment: 1. AKI - due to severe bilat pyelo, sig diffuse bilat renal changes (swelling) by CT. No obstruction. Creat down some with IVF's and/or possibly started to heal from infection.  No uremic symptoms. UOP records inaccurate d/t no hat in toilet and incontinence.   2. Volume - getting IVF"s now - 75 cc/hour 3. Hyperkalemia - K drifting up. C/w tubulointerstitial injury related to pyelo. Already on K restricted diet and with DM may be low renin/low aldo. Dose of kayexalate today (15 gm) 4. Anemia - Hb stable around 11 5. E.coli pyelo and bacteremia - repeat UA still w sig pyuria. Upgraded to Meropenem on 12/26 6. Hx CVA 7. HTN on MTP 25 bid; home acei on hold, bp's good 8. DNR  Plan: as above - continue IVF's, daily labs, give 15 dose of kayexalate, attempt better UOP recording.  [name] Maes, MD [facility] [phone] Pager 06/23/2016, 3:40 PM

## 2016-06-23 NOTE — Progress Notes (Signed)
Pharmacy Antibiotic Note  [name] Michael is a 56 y.o. female admitted on 06/11/2016 with sepsis secondary to E.coli pyelonephritis, bacteremia. She was initially started on Zosyn, then anttibiotics de-escalated per E.coli sensitivities in blood/urine. Patient with persistent fevers and elevated WBC, so antibiotics broadened to Meropenem on 12/26.   D#12 total abx, Day 7 Meropenem Afebrile over past 24 hours WBC and Scr slowly improving  Plan: Continue Meropenem 500mg  IV q12h. Monitor renal function & patient progress.  Height: 4' 6.5" (138.4 cm) Weight: 80 lb 11 oz (36.6 kg) IBW/kg (Calculated) : 32.85  Temp (24hrs), Avg:98.5 F (36.9 C), Min:98 F (36.7 C), Max:99.8 F (37.7 C)   Recent Labs Lab 06/18/16 0451 06/19/16 0427 06/20/16 0351 06/21/16 0424 06/22/16 0941 06/23/16 0510  WBC 20.5* 20.0*  --  23.7* 20.6* 19.8*  CREATININE 2.70* 2.52* 2.89* 2.83* 2.58* 2.55*    Estimated Creatinine Clearance: 12.9 mL/min (by C-G formula based on SCr of 2.55 mg/dL (H)).    Allergies  Allergen Reactions  . Hydrocodone Nausea And Vomiting    Antimicrobials this admission: 12/21 Zosyn >> 12/24 12/20 Ceftriaxone >> 12/21 12/24 Ceftin (for bacteremia/UTI, MD not concern about colitis)>> 12/26 12/26 Meropenem >>  Dose adjustments this admission:  Microbiology results: 12/21 BCx: 2/2 E.coli (R = amp; I = unasyn)  12/20 UCx: > 100K E. coli (R = amp; I = unasyn)  12/20  MRSA PCR: neg   Thank you for allowing pharmacy to be a part of this patient's care.  Adrian Saran, PharmD, BCPS Pager [phone] 06/23/2016 8:53 AM

## 2016-06-23 NOTE — Progress Notes (Signed)
Occupational Therapy Treatment Patient Details Name: [name] Michael MRN: [id number] DOB: Nov 03, 1960 Today's Date: 06/23/2016    History of present illness [name] Michael is a 56 y.o. female with medical history significant of diabetes mellitus, hyperlipidemia, hypertension, stroke. 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 She needed min cues to remember to keep the walker in contact with the floor. She also stood up from EOB before walker was placed fully in front of her. She will need 24/7 supervision/assist at d/c for safety. Will continue to follow on acute.   Follow Up Recommendations  Home health OT;Supervision/Assistance - 24 hour    Equipment Recommendations  None recommended by OT    Recommendations for Other Services      Precautions / Restrictions Precautions Precautions: Fall Restrictions Weight Bearing Restrictions: No       Mobility Bed Mobility Overal bed mobility: Needs Assistance Bed Mobility: Supine to Sit;Sit to Supine     Supine to sit: Min guard Sit to supine: Min guard   General bed mobility comments: cues for safety.  Transfers Overall transfer level: Needs assistance Equipment used: Rolling walker (2 wheeled) Transfers: Sit to/from Stand Sit to Stand: Min assist         General transfer comment: cues for safety, hand placement. min assist to rise and steady.    Balance     Sitting balance-Leahy Scale: Fair       Standing balance-Leahy Scale: Poor                     ADL       Grooming: Wash/dry hands;Minimal assistance;Standing                   Toilet Transfer: Minimal assistance;Ambulation;RW;BSC   Toileting- Clothing  Manipulation and Hygiene: Minimal assistance;Sit to/from stand         General ADL Comments: Pt transferred from bed into the bathroom with the walker but tried to pick up the walker instead of rolling it and had a LOB requiring min assist to recover. Instructed pt to keep the walker in contact with the floor for safety. She did better with exiting the bathroom in a safer manner. She required min assist for balanced both in standing at the commode to perform hygiene as well as at the sink to was her hands and bathe her periareas. Noted pt with some urine soiling of her bed pads upon sitting up from bed.       Vision                 Additional Comments: pt reports blindness in L eye but reports she also doesnt see well out of R eye either.    Perception     Praxis      Cognition   Behavior During Therapy: Flat affect Overall Cognitive Status: No family/caregiver present to determine baseline cognitive functioning Area of Impairment: Safety/judgement;Problem solving              Problem Solving: Slow processing;Requires verbal cues;Requires tactile cues General Comments: needs cues for safety as she tends to pick up the walker and stood from EOB before her walker was placed fully in  front of her.    Extremity/Trunk Assessment               Exercises     Shoulder Instructions       General Comments      Pertinent Vitals/ Pain       Pain Assessment: No/denies pain  Home Living                                          Prior Functioning/Environment              Frequency  Min 2X/week        Progress Toward Goals  OT Goals(current goals can now be found in the care plan section)  Progress towards OT goals: Progressing toward goals     Plan Discharge plan needs to be updated    Co-evaluation                 End of Session Equipment Utilized During Treatment: Rolling walker   Activity Tolerance Patient tolerated  treatment well   Patient Left in bed;with bed alarm set;with call bell/phone within reach   Nurse Communication          Time: 1500-1530 OT Time Calculation (min): 30 min  Charges: OT General Charges $OT Visit: 1 Procedure OT Treatments $Self Care/Home Management : 8-22 mins $Therapeutic Activity: 8-22 mins  Jules Schick  [phone] 06/23/2016, 3:41 PM

## 2016-06-23 NOTE — Progress Notes (Signed)
PROGRESS NOTE  [name] Michael UYZ:[id number] DOB: [birth year] DOA: 06/11/2016 PCP: Arnoldo Morale, MD  HPI/Recap of past 81 hours: 56 year old female past oral history diabetes mellitus, hyperlipidemia, hypertension and CVA admitted on 12/20 after having several days of nausea vomiting, diarrhea and weakness. Patient found to have acute kidney injury and sepsis secondary to Escherichia coli UTI causing pyelonephritis. Sepsis stabilized and patient's in about a change to by mouth, however with persistent fever and high white count, we started IV antibiotics and placed on meropenem. CT scan of abdomen and pelvis ruled out any kind of abscess and only showed nonobstructive stones. Urology consulted for possible obstructive uropathy, but at this time, they recommended no urology intervention. Was put on IV Lasix for final overload and then started developing worsening renal failure so nephrology consulted. Lasix stopped and gentle IV fluids started.   Foley catheter removed on 12/31. Patient does not have a fever now in over 24 hours. Creatinine and white blood cell count however with minimal change in either direction since yesterday. Patient herself feeling okay other than a little cold. No other complaints.   Assessment/Plan: Principal Problem:   Acute kidney injury [facility]): Appreciate nephrology help. Good urine output and creatinine of fluid will continue to trend downward. Have gently increased IV fluids Active Problems:   Diabetes ([name]): Uncontrolled with complications: F8M done last month at 10.9. Glipizide metformin on hold and currently on insulin 70/30. CBGs trending upward. Have increased 70/30-28 units twice a day    Essential hypertension: Trending upwards. Have increased when necessary hydralazine   GERD: PPI   Hypokalemia: Replacing   Hypocalcemia: Replacing   Hypophosphatemia: Replacing   Abdominal pain: Secondary to colitis, now resolved   Colitis: Resolved. CT scan evaluating  causes of diarrhea no possible colitis.   Diastolic CHF (congestive heart failure) ([place]): For now, receiving IV fluids Sepsis secondary to Escherichia coli UTI/pyelonephritis: Continue IV antibiotics. No abscess. Hopefully white blood cell count will come down further Given further fevers, she otherwise clinically looks okay. She is had a Foley catheter in now for 8 days so discontinued this on 12/31. We'll hold off on adding new antibiotics to vancomycin unless her white count worsens or she continues to spike fevers. Patient met criteria for sepsis on admission given leukocytosis, fever, urinary source with tachycardia. Urine culture with no growth from 1/1  Code Status: DO NOT RESUSCITATE   Family Communication: Patient declined for me to call any family   Disposition Plan: Here until renal function improves and infection fully resolved  Consultants:  Urology  Nephrology   Procedures:   None  Antimicrobials:  IV Rocephin 12/20-12/22  IV Zosyn 12/22-12/24  IV Ceftin 12/24-12/26  IV meropenem 12/26-present   DVT prophylaxis:  SCDs   Objective: Vitals:   06/22/16 1455 06/22/16 2202 06/23/16 0200 06/23/16 0553  BP: (!) 153/82 (!) 163/69 (!) 159/78 (!) 172/82  Pulse: 97 (!) 101 78 89  Resp: 17 18 18 18   Temp: 99.8 F (37.7 C) 98 F (36.7 C) 98.2 F (36.8 C) 98.1 F (36.7 C)  TempSrc: Oral Oral Oral Oral  SpO2: 99% 93% 98% 99%  Weight:    36.6 kg (80 lb 11 oz)  Height:        Intake/Output Summary (Last 24 hours) at 06/23/16 1149 Last data filed at 06/23/16 0500  Gross per 24 hour  Intake              965 ml  Output  0 ml  Net              965 ml   Filed Weights   06/21/16 0548 06/22/16 0624 06/23/16 0553  Weight: 37.2 kg (82 lb 0.2 oz) 37.3 kg (82 lb 3.7 oz) 36.6 kg (80 lb 11 oz)    Exam:   General:  Alert and oriented 3, no acute distress   Cardiovascular: regular rate and rhythm, S1-S2   Respiratory: clear to auscultation  bilaterally   Abdomen: soft, nontender, nondistended, positive bowel sounds   Musculoskeletal: no clubbing or cyanosis, trace edema   Skin: no skin breaks, tears or lesions  Psychiatry: patient is appropriate, no evidence of psychoses    Data Reviewed: CBC:  Recent Labs Lab 06/18/16 0451 06/19/16 0427 06/21/16 0424 06/22/16 0941 06/23/16 0510  WBC 20.5* 20.0* 23.7* 20.6* 19.8*  NEUTROABS  --  16.2*  --   --   --   HGB 11.1* 11.2* 10.7* 11.1* 11.1*  HCT 33.5* 34.3* 33.0* 34.5* 34.4*  MCV 80.7 81.3 80.3 83.9 83.5  PLT 336 358 413* 453* 762*   Basic Metabolic Panel:  Recent Labs Lab 06/17/16 0818  06/19/16 0427 06/20/16 0351 06/21/16 0424 06/22/16 0941 06/23/16 0510  NA  --   < > 137 133* 134* 134* 136  K  --   < > 4.5 4.9 5.2* 5.5* 5.6*  CL  --   < > 100* 96* 101 102 104  CO2  --   < > 25 24 23  21* 22  GLUCOSE  --   < > 242* 210* 103* 256* 94  BUN  --   < > 58* 66* 75* 68* 68*  CREATININE  --   < > 2.52* 2.89* 2.83* 2.58* 2.55*  CALCIUM  --   < > 8.9 8.8* 8.7* 8.3* 9.1  MG 1.6*  --   --  2.2  --   --   --   < > = values in this interval not displayed. GFR: Estimated Creatinine Clearance: 12.9 mL/min (by C-G formula based on SCr of 2.55 mg/dL (H)). Liver Function Tests:  Recent Labs Lab 06/18/16 0451 06/19/16 0427  AST 17 15  ALT 17 17  ALKPHOS 95 90  BILITOT 0.6 0.6  PROT 6.6 6.8  ALBUMIN 2.1* 2.2*   No results for input(s): LIPASE, AMYLASE in the last 168 hours. No results for input(s): AMMONIA in the last 168 hours. Coagulation Profile: No results for input(s): INR, PROTIME in the last 168 hours. Cardiac Enzymes: No results for input(s): CKTOTAL, CKMB, CKMBINDEX, TROPONINI in the last 168 hours. BNP (last 3 results) No results for input(s): PROBNP in the last 8760 hours. HbA1C: No results for input(s): HGBA1C in the last 72 hours. CBG:  Recent Labs Lab 06/22/16 0838 06/22/16 1229 06/22/16 1651 06/22/16 2159 06/23/16 1022  GLUCAP 119*  230* 190* 70 220*   Lipid Profile: No results for input(s): CHOL, HDL, LDLCALC, TRIG, CHOLHDL, LDLDIRECT in the last 72 hours. Thyroid Function Tests: No results for input(s): TSH, T4TOTAL, FREET4, T3FREE, THYROIDAB in the last 72 hours. Anemia Panel: No results for input(s): VITAMINB12, FOLATE, FERRITIN, TIBC, IRON, RETICCTPCT in the last 72 hours. Urine analysis:    Component Value Date/Time   COLORURINE STRAW (A) 06/19/2016 0911   APPEARANCEUR CLEAR 06/19/2016 0911   LABSPEC 1.003 (L) 06/19/2016 0911   PHURINE 7.0 06/19/2016 0911   GLUCOSEU >=500 (A) 06/19/2016 0911   HGBUR MODERATE (A) 06/19/2016 0911   BILIRUBINUR NEGATIVE 06/19/2016 0911  BILIRUBINUR n 05/25/2012 1202   KETONESUR NEGATIVE 06/19/2016 0911   PROTEINUR NEGATIVE 06/19/2016 0911   UROBILINOGEN 0.2 05/25/2012 1202   UROBILINOGEN 0.2 03/29/2011 0131   NITRITE NEGATIVE 06/19/2016 0911   LEUKOCYTESUR LARGE (A) 06/19/2016 0911   Sepsis Labs: @LABRCNTIP (procalcitonin:4,lacticidven:4)  ) Recent Results (from the past 240 hour(s))  Culture, blood (Routine X 2) w Reflex to ID Panel     Status: None (Preliminary result)   Collection Time: 06/20/16 10:20 AM  Result Value Ref Range Status   Specimen Description BLOOD RIGHT ARM  Final   Special Requests BOTTLES DRAWN AEROBIC AND ANAEROBIC 5CC  Final   Culture   Final    NO GROWTH 2 DAYS Performed at [facility]    Report Status PENDING  Incomplete  Culture, blood (Routine X 2) w Reflex to ID Panel     Status: None (Preliminary result)   Collection Time: 06/20/16 10:22 AM  Result Value Ref Range Status   Specimen Description BLOOD LEFT ARM  Final   Special Requests BOTTLES DRAWN AEROBIC AND ANAEROBIC 10CC  Final   Culture   Final    NO GROWTH 2 DAYS Performed at [facility]    Report Status PENDING  Incomplete  Urine culture     Status: None   Collection Time: 06/22/16  9:40 AM  Result Value Ref Range Status   Specimen Description URINE,  CLEAN CATCH  Final   Special Requests NONE  Final   Culture NO GROWTH Performed at [facility]   Final   Report Status 06/23/2016 FINAL  Final      Studies: No results found.  Scheduled Meds: . atorvastatin  20 mg Oral Daily  . calcium carbonate  2 tablet Oral BID WC  . insulin aspart  0-15 Units Subcutaneous TID WC  . insulin aspart  0-5 Units Subcutaneous QHS  . insulin aspart protamine- aspart  28 Units Subcutaneous BID WC  . magnesium chloride  1 tablet Oral BID  . meropenem (MERREM) IV  500 mg Intravenous Q12H  . metoprolol tartrate  25 mg Oral BID  . multivitamin with minerals  1 tablet Oral Daily  . sodium chloride flush  3 mL Intravenous Q12H    Continuous Infusions: . sodium chloride 50 mL/hr at 06/22/16 1654     LOS: 12 days    Annita Brod, MD [facility] Pager [phone]  If 7PM-7AM, please contact night-coverage [email or web address] Password [email or web address] 06/23/2016, 11:49 AM

## 2016-06-24 DIAGNOSIS — E875 Hyperkalemia: Secondary | ICD-10-CM

## 2016-06-24 LAB — RENAL FUNCTION PANEL
ALBUMIN: 2.3 g/dL — AB (ref 3.5–5.0)
Anion gap: 11 (ref 5–15)
BUN: 67 mg/dL — AB (ref 6–20)
CO2: 22 mmol/L (ref 22–32)
Calcium: 8.5 mg/dL — ABNORMAL LOW (ref 8.9–10.3)
Chloride: 101 mmol/L (ref 101–111)
Creatinine, Ser: 2.21 mg/dL — ABNORMAL HIGH (ref 0.44–1.00)
GFR calc Af Amer: 28 mL/min — ABNORMAL LOW (ref >60)
GFR, EST NON AFRICAN AMERICAN: 24 mL/min — AB (ref >60)
Glucose, Bld: 289 mg/dL — ABNORMAL HIGH (ref 65–99)
PHOSPHORUS: 5.7 mg/dL — AB (ref 2.5–4.6)
POTASSIUM: 5.2 mmol/L — AB (ref 3.5–5.1)
Sodium: 134 mmol/L — ABNORMAL LOW (ref 135–145)

## 2016-06-24 LAB — CBC
HEMATOCRIT: 33 % — AB (ref 36.0–46.0)
HEMOGLOBIN: 10.7 g/dL — AB (ref 12.0–15.0)
MCH: 27.1 pg (ref 26.0–34.0)
MCHC: 32.4 g/dL (ref 30.0–36.0)
MCV: 83.5 fL (ref 78.0–100.0)
Platelets: 498 10*3/uL — ABNORMAL HIGH (ref 150–400)
RBC: 3.95 MIL/uL (ref 3.87–5.11)
RDW: 15.2 % (ref 11.5–15.5)
WBC: 18.9 10*3/uL — AB (ref 4.0–10.5)

## 2016-06-24 LAB — GLUCOSE, CAPILLARY
GLUCOSE-CAPILLARY: 134 mg/dL — AB (ref 65–99)
GLUCOSE-CAPILLARY: 100 mg/dL — AB (ref 65–99)
GLUCOSE-CAPILLARY: 76 mg/dL (ref 65–99)
Glucose-Capillary: 211 mg/dL — ABNORMAL HIGH (ref 65–99)

## 2016-06-24 MED ORDER — NEPRO/CARBSTEADY PO LIQD
237.0000 mL | Freq: Two times a day (BID) | ORAL | Status: DC
  Administered 2016-06-24 – 2016-07-01 (×12): 237 mL via ORAL
  Filled 2016-06-24 (×16): qty 237

## 2016-06-24 MED ORDER — ZINC OXIDE 20 % EX OINT
TOPICAL_OINTMENT | CUTANEOUS | Status: DC | PRN
  Administered 2016-06-24: 22:00:00 via TOPICAL
  Filled 2016-06-24: qty 28.35
  Filled 2016-06-24: qty 114

## 2016-06-24 MED ORDER — PREGABALIN 25 MG PO CAPS
25.0000 mg | ORAL_CAPSULE | Freq: Two times a day (BID) | ORAL | Status: DC
  Administered 2016-06-24 – 2016-07-01 (×15): 25 mg via ORAL
  Filled 2016-06-24 (×15): qty 1

## 2016-06-24 NOTE — Progress Notes (Signed)
[facility] Interval Note  Pt not seen today but medical record and labs reviewed. Creatinine slowly falling, GFR improved to around 24, K better (after small 15 gm dose of Kayexalate yesterday) though still mild K elevation.  Anticipate further recovery, and agree with Dr. [place] Pink that pt should stay in house until clear steady improvement in renal function.   I recommend daily renal panel for assessment of creatinine and K, taper IVF off as creatinine falls then assess with po hydration only.  I am otherwise adding little to her care at this time. Renal will sign off.  Call if questions.  Jamal Maes, MD [facility] [phone] Pager 06/24/2016, 4:22 PM

## 2016-06-24 NOTE — Progress Notes (Signed)
Patient's CBG was 55. A snack was given to the patient and CBG was rechecked 15 mins later. CBG was 179.

## 2016-06-24 NOTE — Progress Notes (Signed)
PROGRESS NOTE  [name] Michael GYB:[id number] DOB: Jun 11, 1961 DOA: 06/11/2016 PCP: Arnoldo Morale, MD  HPI/Recap of past 60 hours: 56 year old female past oral history diabetes mellitus, hyperlipidemia, hypertension and CVA admitted on 12/20 after having several days of nausea vomiting, diarrhea and weakness. Patient found to have acute kidney injury and sepsis secondary to Escherichia coli UTI causing pyelonephritis. Sepsis stabilized and patient's in about a change to by mouth, however with persistent fever and high white count, we started IV antibiotics and placed on meropenem. CT scan of abdomen and pelvis ruled out any kind of abscess and only showed nonobstructive stones. Urology consulted for possible obstructive uropathy, but at this time, they recommended no urology intervention. Was put on IV Lasix for final overload and then started developing worsening renal failure so nephrology consulted. Lasix stopped and gentle IV fluids started.   Foley catheter removed on 12/31. Patient without fever for over 48 hours.. Other white count with minimal change, creatinine continues to improve. Started developing hyperkalemia so given Kayexalate on 1/1. Patient herself no major complaints other than bilateral foot tingling. When prompted actually she says that this is been going on since admission. She's had previous issues with peripheral neuropathy.   Assessment/Plan: Principal Problem:   Acute kidney injury [facility]): Appreciate nephrology help. Good urine output and creatinine of fluid will continue to trend downward. Have gently increased IV fluids Active Problems:   Diabetes [facility]): Uncontrolled with peripheral neuropathy complications: A7G done last month at 10.9. Glipizide metformin on hold and currently on insulin 70/30. CBGs trending upward. Have increased 70/30 to 28 units twice a day. Restarted her Lyrica.    Essential hypertension: Trending upwards. Have increased when necessary hydralazine  GERD: PPI Hyperkalemia. Received Kayexalate 1. Better today though still present.   Hypocalcemia: Replacing   Hypophosphatemia: Replacing   Abdominal pain: Secondary to colitis, now resolved    Colitis: Resolved. CT scan evaluating causes of diarrhea noted no signs of colitis.    Diastolic CHF (congestive heart failure) ([place]): For now, receiving IV fluids  Sepsis secondary to Escherichia coli UTI/pyelonephritis: At this point, patient has been on 2 weeks of antibiotics and 7 days of IV Merrem. Have stopped antibiotics on 1/2. No abscess. Hopefully white blood cell count will come down further Given further fevers, she otherwise clinically looks okay. She is had a Foley catheter in now for 8 days so discontinued this on 12/31.  Patient met criteria for sepsis on admission given leukocytosis, fever, urinary source with tachycardia. Urine culture with no growth from 1/1  Code Status: DO NOT RESUSCITATE   Family Communication: Patient declined for me to call any family   Disposition Plan: Here until renal function improves and infection fully resolved  Consultants:  Urology  Nephrology   Procedures:   None  Antimicrobials:  IV Rocephin 12/20-12/22  IV Zosyn 12/22-12/24  IV Ceftin 12/24-12/26  IV meropenem 12/26-1/2  DVT prophylaxis:  SCDs   Objective: Vitals:   06/23/16 0553 06/23/16 1400 06/23/16 2129 06/24/16 0530  BP: (!) 172/82 (!) 162/76 140/61 (!) 151/74  Pulse: 89  (!) 107 89  Resp: 18 18 18 18   Temp: 98.1 F (36.7 C) 98.8 F (37.1 C) 98.5 F (36.9 C) 97.8 F (36.6 C)  TempSrc: Oral Oral Oral Oral  SpO2: 99% 96% 96% 99%  Weight: 36.6 kg (80 lb 11 oz)   36.5 kg (80 lb 7.5 oz)  Height:        Intake/Output Summary (Last 24 hours) at  06/24/16 1347 Last data filed at 06/24/16 0600  Gross per 24 hour  Intake          2738.33 ml  Output                0 ml  Net          2738.33 ml   Filed Weights   06/22/16 0624 06/23/16 0553 06/24/16 0530  Weight:  37.3 kg (82 lb 3.7 oz) 36.6 kg (80 lb 11 oz) 36.5 kg (80 lb 7.5 oz)    Exam:   General:  Alert and oriented 3, no acute distress   Cardiovascular: regular rate and rhythm, S1-S2   Respiratory: clear to auscultation bilaterally   Abdomen: soft, nontender, nondistended, positive bowel sounds   Musculoskeletal: no clubbing or cyanosis, trace edema   Skin: no skin breaks, tears or lesions  Psychiatry: patient is appropriate, no evidence of psychoses    Data Reviewed: CBC:  Recent Labs Lab 06/19/16 0427 06/21/16 0424 06/22/16 0941 06/23/16 0510 06/24/16 0323  WBC 20.0* 23.7* 20.6* 19.8* 18.9*  NEUTROABS 16.2*  --   --   --   --   HGB 11.2* 10.7* 11.1* 11.1* 10.7*  HCT 34.3* 33.0* 34.5* 34.4* 33.0*  MCV 81.3 80.3 83.9 83.5 83.5  PLT 358 413* 453* 476* 211*   Basic Metabolic Panel:  Recent Labs Lab 06/20/16 0351 06/21/16 0424 06/22/16 0941 06/23/16 0510 06/24/16 0323  NA 133* 134* 134* 136 134*  K 4.9 5.2* 5.5* 5.6* 5.2*  CL 96* 101 102 104 101  CO2 24 23 21* 22 22  GLUCOSE 210* 103* 256* 94 289*  BUN 66* 75* 68* 68* 67*  CREATININE 2.89* 2.83* 2.58* 2.55* 2.21*  CALCIUM 8.8* 8.7* 8.3* 9.1 8.5*  MG 2.2  --   --   --   --   PHOS  --   --   --   --  5.7*   GFR: Estimated Creatinine Clearance: 14.9 mL/min (by C-G formula based on SCr of 2.21 mg/dL (H)). Liver Function Tests:  Recent Labs Lab 06/18/16 0451 06/19/16 0427 06/24/16 0323  AST 17 15  --   ALT 17 17  --   ALKPHOS 95 90  --   BILITOT 0.6 0.6  --   PROT 6.6 6.8  --   ALBUMIN 2.1* 2.2* 2.3*   No results for input(s): LIPASE, AMYLASE in the last 168 hours. No results for input(s): AMMONIA in the last 168 hours. Coagulation Profile: No results for input(s): INR, PROTIME in the last 168 hours. Cardiac Enzymes: No results for input(s): CKTOTAL, CKMB, CKMBINDEX, TROPONINI in the last 168 hours. BNP (last 3 results) No results for input(s): PROBNP in the last 8760 hours. HbA1C: No results for  input(s): HGBA1C in the last 72 hours. CBG:  Recent Labs Lab 06/23/16 1725 06/23/16 2122 06/23/16 2239 06/24/16 0829 06/24/16 1239  GLUCAP 82 55* 179* 134* 100*   Lipid Profile: No results for input(s): CHOL, HDL, LDLCALC, TRIG, CHOLHDL, LDLDIRECT in the last 72 hours. Thyroid Function Tests: No results for input(s): TSH, T4TOTAL, FREET4, T3FREE, THYROIDAB in the last 72 hours. Anemia Panel: No results for input(s): VITAMINB12, FOLATE, FERRITIN, TIBC, IRON, RETICCTPCT in the last 72 hours. Urine analysis:    Component Value Date/Time   COLORURINE STRAW (A) 06/19/2016 0911   APPEARANCEUR CLEAR 06/19/2016 0911   LABSPEC 1.003 (L) 06/19/2016 0911   PHURINE 7.0 06/19/2016 0911   GLUCOSEU >=500 (A) 06/19/2016 0911   HGBUR MODERATE (  A) 06/19/2016 0911   BILIRUBINUR NEGATIVE 06/19/2016 0911   BILIRUBINUR n 05/25/2012 1202   KETONESUR NEGATIVE 06/19/2016 0911   PROTEINUR NEGATIVE 06/19/2016 0911   UROBILINOGEN 0.2 05/25/2012 1202   UROBILINOGEN 0.2 03/29/2011 0131   NITRITE NEGATIVE 06/19/2016 0911   LEUKOCYTESUR LARGE (A) 06/19/2016 0911   Sepsis Labs: @LABRCNTIP (procalcitonin:4,lacticidven:4)  ) Recent Results (from the past 240 hour(s))  Culture, blood (Routine X 2) w Reflex to ID Panel     Status: None (Preliminary result)   Collection Time: 06/20/16 10:20 AM  Result Value Ref Range Status   Specimen Description BLOOD RIGHT ARM  Final   Special Requests BOTTLES DRAWN AEROBIC AND ANAEROBIC 5CC  Final   Culture   Final    NO GROWTH 3 DAYS Performed at [facility]    Report Status PENDING  Incomplete  Culture, blood (Routine X 2) w Reflex to ID Panel     Status: None (Preliminary result)   Collection Time: 06/20/16 10:22 AM  Result Value Ref Range Status   Specimen Description BLOOD LEFT ARM  Final   Special Requests BOTTLES DRAWN AEROBIC AND ANAEROBIC 10CC  Final   Culture   Final    NO GROWTH 3 DAYS Performed at [facility]    Report Status  PENDING  Incomplete  Urine culture     Status: None   Collection Time: 06/22/16  9:40 AM  Result Value Ref Range Status   Specimen Description URINE, CLEAN CATCH  Final   Special Requests NONE  Final   Culture NO GROWTH Performed at [facility]   Final   Report Status 06/23/2016 FINAL  Final      Studies: No results found.  Scheduled Meds: . atorvastatin  20 mg Oral Daily  . calcium carbonate  2 tablet Oral BID WC  . insulin aspart  0-15 Units Subcutaneous TID WC  . insulin aspart  0-5 Units Subcutaneous QHS  . insulin aspart protamine- aspart  28 Units Subcutaneous BID WC  . magnesium chloride  1 tablet Oral BID  . metoprolol tartrate  25 mg Oral BID  . multivitamin with minerals  1 tablet Oral Daily  . pregabalin  25 mg Oral BID  . sodium chloride flush  3 mL Intravenous Q12H    Continuous Infusions: . sodium chloride 75 mL/hr at 06/23/16 1804     LOS: 13 days    Annita Brod, MD [facility] Pager [phone]  If 7PM-7AM, please contact night-coverage [email or web address] Password [email or web address] 06/24/2016, 1:47 PM

## 2016-06-24 NOTE — Progress Notes (Signed)
Nutrition Brief Note  Patient identified with LOS of 13 days.   Patient reports improved appetite and eating well. RD observed 100% meal completion of lunch today. Pt denies nausea.  Interested in Posey shake, will order for pt to try. Pt states her UBW is 80 lb but chart review shows ~90 lb.   Wt Readings from Last 15 Encounters:  06/24/16 80 lb 7.5 oz (36.5 kg)  04/29/16 80 lb 9.6 oz (36.6 kg)  04/15/16 80 lb (36.3 kg)  03/17/16 79 lb (35.8 kg)  03/03/16 80 lb (36.3 kg)  02/06/16 79 lb 6.4 oz (36 kg)  01/28/16 91 lb 11.2 oz (41.6 kg)  03/09/13 90 lb (40.8 kg)  02/24/13 92 lb 9.6 oz (42 kg)  12/21/12 92 lb 1.6 oz (41.8 kg)  12/20/12 91 lb (41.3 kg)  11/26/12 91 lb (41.3 kg)  11/17/12 93 lb (42.2 kg)  05/25/12 94 lb (42.6 kg)  03/18/11 83 lb (37.6 kg)    Body mass index is 19.05 kg/m. Patient meets criteria for normal based on current BMI.   Current diet order is Renal/CHO modified, patient is consuming approximately 100% of meals at this time. Labs and medications reviewed.   No nutrition interventions warranted at this time. If nutrition issues arise, please consult RD.   Clayton Bibles, MS, RD, LDN Pager: [phone] After Hours Pager: [phone]

## 2016-06-24 NOTE — Progress Notes (Signed)
Physical Therapy Treatment Patient Details Name: [name] Michael MRN: [id number] DOB: 1961/05/14 Today's Date: 07-06-2016    History of Present Illness [name] Michael is a 56 y.o. female with medical history significant of diabetes mellitus, hyperlipidemia, hypertension, stroke. 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  Follow Up Recommendations  Home health PT;Supervision for mobility/OOB     Equipment Recommendations  Rolling walker with 5" wheels (youth)    Recommendations for Other Services       Precautions / Restrictions Precautions Precautions: Fall    Mobility  Bed Mobility Overal bed mobility: Needs Assistance Bed Mobility: Supine to Sit;Sit to Supine     Supine to sit: Supervision Sit to supine: Supervision      Transfers Overall transfer level: Needs assistance Equipment used: Rolling walker (2 wheeled) Transfers: Sit to/from Stand   Stand pivot transfers: Min guard       General transfer comment: verbal cues for technique  Ambulation/Gait Ambulation/Gait assistance: Min guard Ambulation Distance (Feet): 300 Feet Assistive device: Rolling walker (2 wheeled) Gait Pattern/deviations: Step-through pattern;Decreased stride length;Trunk flexed     General Gait Details: pt did better today with avoiding obstacles, verbal cues for position from RW, posture   Stairs            Wheelchair Mobility    Modified Rankin (Stroke Patients Only)       Balance                                    Cognition Arousal/Alertness: Awake/alert Behavior During Therapy: Flat affect Overall Cognitive Status: No family/caregiver present to determine baseline cognitive functioning Area of  Impairment: Problem solving;Safety/judgement             Problem Solving: Slow processing;Requires verbal cues      Exercises      General Comments        Pertinent Vitals/Pain Pain Assessment: No/denies pain    Home Living                      Prior Function            PT Goals (current goals can now be found in the care plan section) Progress towards PT goals: Progressing toward goals    Frequency    Min 3X/week      PT Plan Current plan remains appropriate    Co-evaluation             End of Session Equipment Utilized During Treatment: Gait belt Activity Tolerance: Patient tolerated treatment well Patient left: in bed;with call bell/phone within reach;with bed alarm set     Time: 6270-3500 PT Time Calculation (min) (ACUTE ONLY): 21 min  Charges:  $Gait Training: 8-22 mins                    G Codes:      Chancey Cullinane,KATHrine E 2016/07/06, 1:05 PM Carmelia Bake, PT, DPT 07-06-16 Pager: [phone]

## 2016-06-25 LAB — RENAL FUNCTION PANEL
ANION GAP: 11 (ref 5–15)
Albumin: 2.3 g/dL — ABNORMAL LOW (ref 3.5–5.0)
BUN: 71 mg/dL — AB (ref 6–20)
CHLORIDE: 103 mmol/L (ref 101–111)
CO2: 21 mmol/L — ABNORMAL LOW (ref 22–32)
Calcium: 8.7 mg/dL — ABNORMAL LOW (ref 8.9–10.3)
Creatinine, Ser: 2.03 mg/dL — ABNORMAL HIGH (ref 0.44–1.00)
GFR, EST AFRICAN AMERICAN: 31 mL/min — AB (ref >60)
GFR, EST NON AFRICAN AMERICAN: 26 mL/min — AB (ref >60)
Glucose, Bld: 134 mg/dL — ABNORMAL HIGH (ref 65–99)
POTASSIUM: 5 mmol/L (ref 3.5–5.1)
Phosphorus: 4.7 mg/dL — ABNORMAL HIGH (ref 2.5–4.6)
Sodium: 135 mmol/L (ref 135–145)

## 2016-06-25 LAB — CBC
HEMATOCRIT: 32.3 % — AB (ref 36.0–46.0)
HEMOGLOBIN: 10.5 g/dL — AB (ref 12.0–15.0)
MCH: 27.2 pg (ref 26.0–34.0)
MCHC: 32.5 g/dL (ref 30.0–36.0)
MCV: 83.7 fL (ref 78.0–100.0)
Platelets: 545 10*3/uL — ABNORMAL HIGH (ref 150–400)
RBC: 3.86 MIL/uL — AB (ref 3.87–5.11)
RDW: 15.4 % (ref 11.5–15.5)
WBC: 18.1 10*3/uL — AB (ref 4.0–10.5)

## 2016-06-25 LAB — CULTURE, BLOOD (ROUTINE X 2)
Culture: NO GROWTH
Culture: NO GROWTH

## 2016-06-25 LAB — BASIC METABOLIC PANEL
ANION GAP: 11 (ref 5–15)
BUN: 73 mg/dL — ABNORMAL HIGH (ref 6–20)
CHLORIDE: 104 mmol/L (ref 101–111)
CO2: 21 mmol/L — AB (ref 22–32)
Calcium: 8.7 mg/dL — ABNORMAL LOW (ref 8.9–10.3)
Creatinine, Ser: 2.02 mg/dL — ABNORMAL HIGH (ref 0.44–1.00)
GFR calc Af Amer: 31 mL/min — ABNORMAL LOW (ref >60)
GFR calc non Af Amer: 27 mL/min — ABNORMAL LOW (ref >60)
GLUCOSE: 136 mg/dL — AB (ref 65–99)
POTASSIUM: 5 mmol/L (ref 3.5–5.1)
Sodium: 136 mmol/L (ref 135–145)

## 2016-06-25 LAB — GLUCOSE, CAPILLARY
GLUCOSE-CAPILLARY: 397 mg/dL — AB (ref 65–99)
GLUCOSE-CAPILLARY: 92 mg/dL (ref 65–99)
GLUCOSE-CAPILLARY: 114 mg/dL — AB (ref 65–99)
Glucose-Capillary: 122 mg/dL — ABNORMAL HIGH (ref 65–99)

## 2016-06-25 NOTE — Progress Notes (Addendum)
PROGRESS NOTE  [name] Michael QMG:[id number] DOB: [birth year] DOA: 06/11/2016 PCP: Arnoldo Morale, MD  HPI/Recap of past 11 hours: 56 year old female past oral history diabetes mellitus, hyperlipidemia, hypertension and CVA admitted on 12/20 after having several days of nausea vomiting, diarrhea and weakness. Patient found to have acute kidney injury and sepsis secondary to Escherichia coli UTI causing pyelonephritis. Sepsis stabilized and patient's in about a change to by mouth, however with persistent fever and high white count, we started IV antibiotics and placed on meropenem. CT scan of abdomen and pelvis ruled out any kind of abscess and only showed nonobstructive stones. Urology consulted for possible obstructive uropathy, but at this time, they recommended no urology intervention. Was put on IV Lasix for final overload and then started developing worsening renal failure so nephrology consulted. Lasix stopped and gentle IV fluids started.   Foley catheter removed on 12/31. Patient without fever since then. Her white count is slowly improving although her renal function continues to improve. Started developing hyperkalemia so given Kayexalate on 1/1. She was complaining of some bilateral foot tingling so her home Lyrica was restarted. Today she says that is improved. She complains of some abdominal distention but no diarrhea or abdominal pain.   Assessment/Plan: Principal Problem:   Acute kidney injury [facility]): Appreciate nephrology help. Good urine output and creatinine of fluid will continue to trend downward. Have gently increased IV fluids Active Problems:   Diabetes [facility]): Uncontrolled with peripheral neuropathy complications: T2I done last month at 10.9. Glipizide metformin on hold and currently on insulin 70/30. CBGs trending upward. Have increased 70/30 to 28 units twice a day. Elevated sugar today at noon. Continue to monitor as earlier sugars and once yesterday was slightly lower.   Essential hypertension: Trending upwards. Have increased when necessary hydralazine   GERD: PPI Hyperkalemia. Received Kayexalate 1. Better today though still present.   Hypocalcemia: Replacing   Hypophosphatemia: Replacing   Abdominal pain: Secondary to colitis, now resolved    Colitis: Resolved. CT scan evaluating causes of diarrhea noted no signs of colitis. Abdomen more distended today although nontender and no diarrhea. We'll monitor for now. If her white count does not drop further or she spikes a fever, would check CT and stool cultures    Diastolic CHF (congestive heart failure) ([place]): For now, receiving IV fluids  Sepsis secondary to Escherichia coli UTI/pyelonephritis: At this point, patient has been on 2 weeks of antibiotics and 7 days of IV Merrem. Have stopped antibiotics on 1/2. No abscess. Hopefully white blood cell count will come down further Given further fevers, she otherwise clinically looks okay. She is had a Foley catheter in now for 8 days so discontinued this on 12/31.  Patient met criteria for sepsis on admission given leukocytosis, fever, urinary source with tachycardia. Urine culture with no growth from 1/1  Code Status: DO NOT RESUSCITATE   Family Communication: Patient declined for me to call any family   Disposition Plan: Here until renal function improves and infection fully resolved.  She presumably was living with her sister and she says that her sister does not want her to come back. This may be difficult as she does not have any insurance she needs a place to go.  Consultants:  Urology  Nephrology   Procedures:   None  Antimicrobials:  IV Rocephin 12/20-12/22  IV Zosyn 12/22-12/24  IV Ceftin 12/24-12/26  IV meropenem 12/26-1/2  DVT prophylaxis:  SCDs   Objective: Vitals:   06/24/16 1430 06/25/16 0615  06/25/16 0620 06/25/16 1425  BP: 135/68 (!) 144/76  136/64  Pulse: 83 76  90  Resp: 18 18  19   Temp: 98 F (36.7 C) 98 F (36.7 C)   99.4 F (37.4 C)  TempSrc: Oral Oral  Oral  SpO2: 99% 98%  100%  Weight:   37.4 kg (82 lb 7.2 oz)   Height:        Intake/Output Summary (Last 24 hours) at 06/25/16 1446 Last data filed at 06/25/16 1425  Gross per 24 hour  Intake             2280 ml  Output                0 ml  Net             2280 ml   Filed Weights   06/23/16 0553 06/24/16 0530 06/25/16 0620  Weight: 36.6 kg (80 lb 11 oz) 36.5 kg (80 lb 7.5 oz) 37.4 kg (82 lb 7.2 oz)    Exam:   General:  Alert and oriented 3, no acute distress   Cardiovascular: regular rate and rhythm, S1-S2   Respiratory: clear to auscultation bilaterally   Abdomen: soft, distended, nontender, hypoactive bowel sounds  Musculoskeletal: no clubbing or cyanosis, trace edema   Skin: no skin breaks, tears or lesions  Psychiatry: patient is appropriate, no evidence of psychoses    Data Reviewed: CBC:  Recent Labs Lab 06/19/16 0427 06/21/16 0424 06/22/16 0941 06/23/16 0510 06/24/16 0323 06/25/16 0529  WBC 20.0* 23.7* 20.6* 19.8* 18.9* 18.1*  NEUTROABS 16.2*  --   --   --   --   --   HGB 11.2* 10.7* 11.1* 11.1* 10.7* 10.5*  HCT 34.3* 33.0* 34.5* 34.4* 33.0* 32.3*  MCV 81.3 80.3 83.9 83.5 83.5 83.7  PLT 358 413* 453* 476* 498* 932*   Basic Metabolic Panel:  Recent Labs Lab 06/20/16 0351 06/21/16 0424 06/22/16 0941 06/23/16 0510 06/24/16 0323 06/25/16 0529  NA 133* 134* 134* 136 134* 135  136  K 4.9 5.2* 5.5* 5.6* 5.2* 5.0  5.0  CL 96* 101 102 104 101 103  104  CO2 24 23 21* 22 22 21*  21*  GLUCOSE 210* 103* 256* 94 289* 134*  136*  BUN 66* 75* 68* 68* 67* 71*  73*  CREATININE 2.89* 2.83* 2.58* 2.55* 2.21* 2.03*  2.02*  CALCIUM 8.8* 8.7* 8.3* 9.1 8.5* 8.7*  8.7*  MG 2.2  --   --   --   --   --   PHOS  --   --   --   --  5.7* 4.7*   GFR: Estimated Creatinine Clearance: 16.3 mL/min (by C-G formula based on SCr of 2.03 mg/dL (H)). Liver Function Tests:  Recent Labs Lab 06/19/16 0427 06/24/16 0323  06/25/16 0529  AST 15  --   --   ALT 17  --   --   ALKPHOS 90  --   --   BILITOT 0.6  --   --   PROT 6.8  --   --   ALBUMIN 2.2* 2.3* 2.3*   No results for input(s): LIPASE, AMYLASE in the last 168 hours. No results for input(s): AMMONIA in the last 168 hours. Coagulation Profile: No results for input(s): INR, PROTIME in the last 168 hours. Cardiac Enzymes: No results for input(s): CKTOTAL, CKMB, CKMBINDEX, TROPONINI in the last 168 hours. BNP (last 3 results) No results for input(s): PROBNP in the last 8760 hours.  HbA1C: No results for input(s): HGBA1C in the last 72 hours. CBG:  Recent Labs Lab 06/24/16 1239 06/24/16 1747 06/24/16 2127 06/25/16 0726 06/25/16 1153  GLUCAP 100* 76 211* 122* 397*   Lipid Profile: No results for input(s): CHOL, HDL, LDLCALC, TRIG, CHOLHDL, LDLDIRECT in the last 72 hours. Thyroid Function Tests: No results for input(s): TSH, T4TOTAL, FREET4, T3FREE, THYROIDAB in the last 72 hours. Anemia Panel: No results for input(s): VITAMINB12, FOLATE, FERRITIN, TIBC, IRON, RETICCTPCT in the last 72 hours. Urine analysis:    Component Value Date/Time   COLORURINE STRAW (A) 06/19/2016 0911   APPEARANCEUR CLEAR 06/19/2016 0911   LABSPEC 1.003 (L) 06/19/2016 0911   PHURINE 7.0 06/19/2016 0911   GLUCOSEU >=500 (A) 06/19/2016 0911   HGBUR MODERATE (A) 06/19/2016 0911   BILIRUBINUR NEGATIVE 06/19/2016 0911   BILIRUBINUR n 05/25/2012 1202   KETONESUR NEGATIVE 06/19/2016 0911   PROTEINUR NEGATIVE 06/19/2016 0911   UROBILINOGEN 0.2 05/25/2012 1202   UROBILINOGEN 0.2 03/29/2011 0131   NITRITE NEGATIVE 06/19/2016 0911   LEUKOCYTESUR LARGE (A) 06/19/2016 0911   Sepsis Labs: @LABRCNTIP (procalcitonin:4,lacticidven:4)  ) Recent Results (from the past 240 hour(s))  Culture, blood (Routine X 2) w Reflex to ID Panel     Status: None (Preliminary result)   Collection Time: 06/20/16 10:20 AM  Result Value Ref Range Status   Specimen Description BLOOD RIGHT  ARM  Final   Special Requests BOTTLES DRAWN AEROBIC AND ANAEROBIC 5CC  Final   Culture   Final    NO GROWTH 4 DAYS Performed at [facility]    Report Status PENDING  Incomplete  Culture, blood (Routine X 2) w Reflex to ID Panel     Status: None (Preliminary result)   Collection Time: 06/20/16 10:22 AM  Result Value Ref Range Status   Specimen Description BLOOD LEFT ARM  Final   Special Requests BOTTLES DRAWN AEROBIC AND ANAEROBIC 10CC  Final   Culture   Final    NO GROWTH 4 DAYS Performed at  [facility]    Report Status PENDING  Incomplete  Urine culture     Status: None   Collection Time: 06/22/16  9:40 AM  Result Value Ref Range Status   Specimen Description URINE, CLEAN CATCH  Final   Special Requests NONE  Final   Culture NO GROWTH Performed at [facility]   Final   Report Status 06/23/2016 FINAL  Final      Studies: No results found.  Scheduled Meds: . atorvastatin  20 mg Oral Daily  . calcium carbonate  2 tablet Oral BID WC  . feeding supplement (NEPRO CARB STEADY)  237 mL Oral BID BM  . insulin aspart  0-15 Units Subcutaneous TID WC  . insulin aspart  0-5 Units Subcutaneous QHS  . insulin aspart protamine- aspart  28 Units Subcutaneous BID WC  . magnesium chloride  1 tablet Oral BID  . metoprolol tartrate  25 mg Oral BID  . multivitamin with minerals  1 tablet Oral Daily  . pregabalin  25 mg Oral BID  . sodium chloride flush  3 mL Intravenous Q12H    Continuous Infusions: . sodium chloride 100 mL/hr at 06/25/16 0930     LOS: 14 days    Annita Brod, MD [facility] Pager [phone]  If 7PM-7AM, please contact night-coverage [email or web address] Password [email or web address] 06/25/2016, 2:46 PM

## 2016-06-25 NOTE — Progress Notes (Signed)
Occupational Therapy Treatment Patient Details Name: SONJA MANSEAU MRN: [id number] DOB: [birth year] Today's Date: 06/25/2016    History of present illness SHEREESE BONNIE is a 56 y.o. female with medical history significant of diabetes mellitus, hyperlipidemia, hypertension, stroke. 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, field cut in R eye since October, pt does not attribute this to her CVA   OT comments  Pt will need 24/7 A with ADL activity   Follow Up Recommendations  Home health OT;Supervision/Assistance - 24 hour    Equipment Recommendations  None recommended by OT    Recommendations for Other Services      Precautions / Restrictions Precautions Precautions: Fall       Mobility Bed Mobility Overal bed mobility: Needs Assistance Bed Mobility: Supine to Sit;Sit to Supine     Supine to sit: Supervision Sit to supine: Supervision      Transfers Overall transfer level: Needs assistance Equipment used: Rolling walker (2 wheeled) Transfers: Sit to/from Stand Sit to Stand: Min guard Stand pivot transfers: Min guard       General transfer comment: verbal cues for technique    Balance                                   ADL Overall ADL's : Needs assistance/impaired Eating/Feeding: Set up;Sitting   Grooming: Wash/dry hands;Standing;Min guard;Brushing hair               Lower Body Dressing: Sit to/from stand;Cueing for safety;Moderate assistance Lower Body Dressing Details (indicate cue type and reason): pts abdomen makes LB dressing challenging Toilet Transfer: Minimal assistance;Ambulation;RW;BSC   Toileting- Clothing Manipulation and Hygiene: Minimal assistance;Sit to/from stand                Vision                     Perception     Praxis      Cognition     Overall Cognitive Status: No family/caregiver present to determine  baseline cognitive functioning                  General Comments: needs cues for safety as she tends to pick up the walker and stood from EOB before her walker was placed fully in front of her.    Extremity/Trunk Assessment               Exercises     Shoulder Instructions       General Comments      Pertinent Vitals/ Pain       Pain Assessment: No/denies pain  Home Living                                          Prior Functioning/Environment              Frequency  Min 2X/week        Progress Toward Goals  OT Goals(current goals can now be found in the care plan section)  Progress towards OT goals: Progressing toward goals     Plan Discharge plan needs to be updated    Co-evaluation  End of Session Equipment Utilized During Treatment: Rolling walker   Activity Tolerance Patient tolerated treatment well   Patient Left with bed alarm set;with call bell/phone within reach;in chair   Nurse Communication Mobility status        Time: 4834-7583 OT Time Calculation (min): 22 min  Charges: OT General Charges $OT Visit: 1 Procedure OT Treatments $Self Care/Home Management : 8-22 mins  Deslyn Cavenaugh, Edwena Felty D 06/25/2016, 12:14 PM

## 2016-06-26 ENCOUNTER — Inpatient Hospital Stay (HOSPITAL_COMMUNITY): Payer: Medicaid Other

## 2016-06-26 LAB — GLUCOSE, CAPILLARY
GLUCOSE-CAPILLARY: 87 mg/dL (ref 65–99)
Glucose-Capillary: 74 mg/dL (ref 65–99)
Glucose-Capillary: 360 mg/dL — ABNORMAL HIGH (ref 65–99)
Glucose-Capillary: 70 mg/dL (ref 65–99)

## 2016-06-26 LAB — RENAL FUNCTION PANEL
ANION GAP: 12 (ref 5–15)
Albumin: 2.4 g/dL — ABNORMAL LOW (ref 3.5–5.0)
BUN: 73 mg/dL — ABNORMAL HIGH (ref 6–20)
CHLORIDE: 108 mmol/L (ref 101–111)
CO2: 19 mmol/L — AB (ref 22–32)
Calcium: 8.7 mg/dL — ABNORMAL LOW (ref 8.9–10.3)
Creatinine, Ser: 2.02 mg/dL — ABNORMAL HIGH (ref 0.44–1.00)
GFR calc non Af Amer: 27 mL/min — ABNORMAL LOW (ref >60)
GFR, EST AFRICAN AMERICAN: 31 mL/min — AB (ref >60)
Glucose, Bld: 82 mg/dL (ref 65–99)
POTASSIUM: 5.3 mmol/L — AB (ref 3.5–5.1)
Phosphorus: 4.3 mg/dL (ref 2.5–4.6)
Sodium: 139 mmol/L (ref 135–145)

## 2016-06-26 LAB — CBC
HEMATOCRIT: 31.4 % — AB (ref 36.0–46.0)
HEMOGLOBIN: 10.1 g/dL — AB (ref 12.0–15.0)
MCH: 27.3 pg (ref 26.0–34.0)
MCHC: 32.2 g/dL (ref 30.0–36.0)
MCV: 84.9 fL (ref 78.0–100.0)
Platelets: 553 10*3/uL — ABNORMAL HIGH (ref 150–400)
RBC: 3.7 MIL/uL — ABNORMAL LOW (ref 3.87–5.11)
RDW: 15.5 % (ref 11.5–15.5)
WBC: 18.4 10*3/uL — ABNORMAL HIGH (ref 4.0–10.5)

## 2016-06-26 MED ORDER — IOPAMIDOL (ISOVUE-300) INJECTION 61%
INTRAVENOUS | Status: AC
  Administered 2016-06-26: 15 mL
  Filled 2016-06-26: qty 30

## 2016-06-26 MED ORDER — SODIUM BICARBONATE 650 MG PO TABS
650.0000 mg | ORAL_TABLET | Freq: Two times a day (BID) | ORAL | Status: DC
  Administered 2016-06-26 – 2016-07-01 (×10): 650 mg via ORAL
  Filled 2016-06-26 (×10): qty 1

## 2016-06-26 MED ORDER — SODIUM BICARBONATE 8.4 % IV SOLN
50.0000 meq | Freq: Once | INTRAVENOUS | Status: AC
  Administered 2016-06-26: 50 meq via INTRAVENOUS
  Filled 2016-06-26: qty 50

## 2016-06-26 MED ORDER — IOPAMIDOL (ISOVUE-300) INJECTION 61%
15.0000 mL | Freq: Two times a day (BID) | INTRAVENOUS | Status: AC
  Administered 2016-06-26: 15 mL via ORAL

## 2016-06-26 MED ORDER — IOPAMIDOL (ISOVUE-300) INJECTION 61%: 30.0000 mL | Freq: Once | INTRAVENOUS | Status: DC | PRN

## 2016-06-26 NOTE — Progress Notes (Signed)
Spoke with pt's sister Deloria Lair via phone [phone] concerning pt coming to live with her. Mrs. Langston Masker states," my husband (Mr. Langston Masker) said (she) Ms. Tison can not come live there".   Mrs. Langston Masker also states that she called [facility] on her sister and tried to get her help.

## 2016-06-26 NOTE — Progress Notes (Signed)
Pt plan to dc home with [place] and [facility] RW.

## 2016-06-26 NOTE — Progress Notes (Addendum)
PROGRESS NOTE  [name] Michael XTK:[id number] DOB: [birth year] DOA: 06/11/2016 PCP: Arnoldo Morale, MD  HPI: 56 year old female past oral history diabetes mellitus, hyperlipidemia, hypertension and CVA admitted on 12/20 after having several days of nausea vomiting, diarrhea and weakness. Patient found to have acute kidney injury and sepsis secondary to Escherichia coli UTI causing pyelonephritis. Sepsis stabilized and patient's in about a change to by mouth, however with persistent fever and high white count, we started IV antibiotics and placed on meropenem. CT scan of abdomen and pelvis ruled out any kind of abscess and only showed nonobstructive stones. Urology consulted for possible obstructive uropathy, but at this time, they recommended no urology intervention. Was put on IV Lasix for final overload and then started developing worsening renal failure so nephrology consulted. Lasix stopped and gentle IV fluids started.   Foley catheter removed on 12/31. Patient without fever since then. Her white count is slowly improving although her renal function continues to improve. Started developing hyperkalemia so given Kayexalate on 1/1. She was complaining of some bilateral foot tingling so her home Lyrica was restarted. Today she says that is improved. She complains of some abdominal distention but no diarrhea or abdominal pain.  Subjectives last 24hrs:  Remain weak, report almost collapsed one her way back to her room after walking with physical therapy She denies pain, no fever, persistent leukocytosis  Assessment/Plan: Principal Problem:   Acute kidney injury [facility]): Appreciate nephrology help. , urine output was good prior to foley removal on 12/31.  Currently not able to record I/O's due to Patient is incontience of urine.  Cr has been at 2 for the last three days, may be her new baseline. Nephrology has signed off.  She is continued on gently increased IV fluids. D/c oral mag.  Mild  hyperkalemia: iv sodium bicarb on 1/4, Start sodium bicarb supplement due to metabolic acidosis.  Hypocalcemia: Replacing, will check ironized calcium, vit d level,    Hypophosphatemia/hyperphosphetemia : initially, s/p Replacement, has a few highs, now wnl, continue monitor. Check pth   Colitis:  Ct ab + colitis on 12/20, with abdominal pain. She received abx, colitis has resolved on Repeat ct ab on 12/26,    Abdomen more distended although nontender and no diarrhea. white count remain elevated ,repeat ct ab/pel on 1/4  Sepsis secondary to Escherichia coli UTI/pyelonephritis/ecoli bacteremia:  Patient met criteria for sepsis on admission given leukocytosis, fever, urinary source and ecoli bacteremia with tachycardia.  Repeat Urine culture with no growth from 1/1 , repeat blood culture no growth patient has been on 2 weeks of antibiotics (from 12/21 to 1/2) with zosyn, rocephin then Meropenem).   She is had a Foley catheter for 8 days which was discontinued on 12/31.   Due to persistent leukocytosis, repeat ct chest /ab/pel on 1/4.  Insulin dependent Diabetes ([place]): Uncontrolled with peripheral neuropathy complications: D9M done last month at 10.9. Glipizide metformin on hold and currently on insulin 70/30. CBGs trending upward. Have increased 70/30 to 28 units twice a day.  Continue adjust insulin  Diastolic CHF (congestive heart failure) ([place]):  receiving IV fluids. No edema   Essential hypertension: Trending upwards. On lopressor 58m bid, Have increased when necessary hydralazine   GERD: PPI  FTT: generalized weakness, very frail, home health vs placement, she used to live with her sister, now her sister does not want her back, care manager consulted.  Code Status: DO NOT RESUSCITATE   Family Communication: Patient declined for me to call any  family   Disposition Plan: Here until renal function improves and infection fully resolved.  She presumably was living with her sister and  she says that her sister does not want her to come back. This may be difficult as she does not have any insurance she needs a place to go.  Consultants:  Urology  Nephrology   Procedures:   None  Antimicrobials:  IV Rocephin 12/20-12/22  IV Zosyn 12/22-12/24  IV Ceftin 12/24-12/26  IV meropenem 12/26-1/2  DVT prophylaxis:  SCDs   Objective: Vitals:   06/25/16 0620 06/25/16 1425 06/25/16 2158 06/26/16 0448  BP:  136/64 130/65 123/66  Pulse:  90 90 87  Resp:  _0 Temp:  99.4 F (37.4 C) 98.9 F (37.2 C) 98 F (36.7 C)  TempSrc:  Oral Oral Oral  SpO2:  100% 100% 100%  Weight: 37.4 kg (82 lb 7.2 oz)   37.5 kg (82 lb 9.6 oz)  Height:        Intake/Output Summary (Last 24 hours) at 06/26/16 1133 Last data filed at 06/26/16 1000  Gross per 24 hour  Intake           2952.5 ml  Output                0 ml  Net           2952.5 ml   Filed Weights   06/24/16 0530 06/25/16 0620 06/26/16 0448  Weight: 36.5 kg (80 lb 7.5 oz) 37.4 kg (82 lb 7.2 oz) 37.5 kg (82 lb 9.6 oz)    Exam:   General:  Very frail and weak, oriented x3 though , + hirsutism,   Cardiovascular: regular rate and rhythm, S1-S2   Respiratory: diminished at basis  Abdomen: distended, nontender, hypoactive bowel sounds  Musculoskeletal: no clubbing or cyanosis, trace edema   Skin: no skin breaks, tears or lesions  Psychiatry: flat affect, no evidence of psychoses    Data Reviewed: CBC:  Recent Labs Lab 06/22/16 0941 06/23/16 0510 06/24/16 0323 06/25/16 0529 06/26/16 0446  WBC 20.6* 19.8* 18.9* 18.1* 18.4*  HGB 11.1* 11.1* 10.7* 10.5* 10.1*  HCT 34.5* 34.4* 33.0* 32.3* 31.4*  MCV 83.9 83.5 83.5 83.7 84.9  PLT 453* 476* 498* 545* 646*   Basic Metabolic Panel:  Recent Labs Lab 06/20/16 0351  06/22/16 0941 06/23/16 0510 06/24/16 0323 06/25/16 0529 06/26/16 0446  NA 133*  < > 134* 136 134* 135  136 139  K 4.9  < > 5.5* 5.6* 5.2* 5.0  5.0 5.3*  CL 96*  < > 102 104 101  103  104 108  CO2 24  < > 21* 22 22 21*  21* 19*  GLUCOSE 210*  < > 256* 94 289* 134*  136* 82  BUN 66*  < > 68* 68* 67* 71*  73* 73*  CREATININE 2.89*  < > 2.58* 2.55* 2.21* 2.03*  2.02* 2.02*  CALCIUM 8.8*  < > 8.3* 9.1 8.5* 8.7*  8.7* 8.7*  MG 2.2  --   --   --   --   --   --   PHOS  --   --   --   --  5.7* 4.7* 4.3  < > = values in this interval not displayed. GFR: Estimated Creatinine Clearance: 16.3 mL/min (by C-G formula based on SCr of 2.02 mg/dL (H)). Liver Function Tests:  Recent Labs Lab 06/24/16 0323 06/25/16 0529 06/26/16 0446  ALBUMIN 2.3* 2.3* 2.4*  No results for input(s): LIPASE, AMYLASE in the last 168 hours. No results for input(s): AMMONIA in the last 168 hours. Coagulation Profile: No results for input(s): INR, PROTIME in the last 168 hours. Cardiac Enzymes: No results for input(s): CKTOTAL, CKMB, CKMBINDEX, TROPONINI in the last 168 hours. BNP (last 3 results) No results for input(s): PROBNP in the last 8760 hours. HbA1C: No results for input(s): HGBA1C in the last 72 hours. CBG:  Recent Labs Lab 06/25/16 1153 06/25/16 1625 06/25/16 2236 06/26/16 0753 06/26/16 1122  GLUCAP 397* 92 114* 74 360*   Lipid Profile: No results for input(s): CHOL, HDL, LDLCALC, TRIG, CHOLHDL, LDLDIRECT in the last 72 hours. Thyroid Function Tests: No results for input(s): TSH, T4TOTAL, FREET4, T3FREE, THYROIDAB in the last 72 hours. Anemia Panel: No results for input(s): VITAMINB12, FOLATE, FERRITIN, TIBC, IRON, RETICCTPCT in the last 72 hours. Urine analysis:    Component Value Date/Time   COLORURINE STRAW (A) 06/19/2016 0911   APPEARANCEUR CLEAR 06/19/2016 0911   LABSPEC 1.003 (L) 06/19/2016 0911   PHURINE 7.0 06/19/2016 0911   GLUCOSEU >=500 (A) 06/19/2016 0911   HGBUR MODERATE (A) 06/19/2016 0911   BILIRUBINUR NEGATIVE 06/19/2016 0911   BILIRUBINUR n 05/25/2012 1202   KETONESUR NEGATIVE 06/19/2016 0911   PROTEINUR NEGATIVE 06/19/2016 0911    UROBILINOGEN 0.2 05/25/2012 1202   UROBILINOGEN 0.2 03/29/2011 0131   NITRITE NEGATIVE 06/19/2016 0911   LEUKOCYTESUR LARGE (A) 06/19/2016 0911   Sepsis Labs: _0 (procalcitonin:4,lacticidven:4)  ) Recent Results (from the past 240 hour(s))  Culture, blood (Routine X 2) w Reflex to ID Panel     Status: None   Collection Time: 06/20/16 10:20 AM  Result Value Ref Range Status   Specimen Description BLOOD RIGHT ARM  Final   Special Requests BOTTLES DRAWN AEROBIC AND ANAEROBIC 5CC  Final   Culture   Final    NO GROWTH 5 DAYS Performed at [facility]    Report Status 06/25/2016 FINAL  Final  Culture, blood (Routine X 2) w Reflex to ID Panel     Status: None   Collection Time: 06/20/16 10:22 AM  Result Value Ref Range Status   Specimen Description BLOOD LEFT ARM  Final   Special Requests BOTTLES DRAWN AEROBIC AND ANAEROBIC 10CC  Final   Culture   Final    NO GROWTH 5 DAYS Performed at [facility]    Report Status 06/25/2016 FINAL  Final  Urine culture     Status: None   Collection Time: 06/22/16  9:40 AM  Result Value Ref Range Status   Specimen Description URINE, CLEAN CATCH  Final   Special Requests NONE  Final   Culture NO GROWTH Performed at [facility]   Final   Report Status 06/23/2016 FINAL  Final      Studies: No results found.  Scheduled Meds: . atorvastatin  20 mg Oral Daily  . calcium carbonate  2 tablet Oral BID WC  . feeding supplement (NEPRO CARB STEADY)  237 mL Oral BID BM  . insulin aspart  0-15 Units Subcutaneous TID WC  . insulin aspart  0-5 Units Subcutaneous QHS  . insulin aspart protamine- aspart  28 Units Subcutaneous BID WC  . magnesium chloride  1 tablet Oral BID  . metoprolol tartrate  25 mg Oral BID  . multivitamin with minerals  1 tablet Oral Daily  . pregabalin  25 mg Oral BID  . sodium chloride flush  3 mL Intravenous Q12H    Continuous  Infusions: . sodium chloride 100 mL/hr at 06/26/16 0130     LOS:  15 days   Time spent: 72mns  Fernanda Twaddell, MD PhD [facility] Pager [phone] If 7PM-7AM, please contact night-coverage [email or web address] Password [email or web address]1/09/2016, 11:33 AM

## 2016-06-26 NOTE — Progress Notes (Signed)
Physical Therapy Treatment Patient Details Name: [name] Michael MRN: [id number] DOB: 01/09/1961 Today's Date: 06/26/2016    History of Present Illness [name] Michael is a 56 y.o. female with medical history significant of diabetes mellitus, hyperlipidemia, hypertension, stroke. 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  Therapist able to catch pt upright (no fall) and pt able to talk throughout, assisted pt back to bed. Vitals: 171/75 mmHg, 92 bpm, Spo2 98% room air.  Reported this to RN, NT in to check glucose.   Follow Up Recommendations  Home health PT;Supervision for mobility/OOB     Equipment Recommendations  Rolling walker with 5" wheels (youth)    Recommendations for Other Services       Precautions / Restrictions Precautions Precautions: Fall Restrictions Weight Bearing Restrictions: No    Mobility  Bed Mobility Overal bed mobility: Needs Assistance Bed Mobility: Supine to Sit;Sit to Supine     Supine to sit: Supervision Sit to supine: Supervision      Transfers Overall transfer level: Needs assistance Equipment used: Rolling walker (2 wheeled) Transfers: Sit to/from Stand   Stand pivot transfers: Min guard       General transfer comment: verbal cues for technique  Ambulation/Gait Ambulation/Gait assistance: Min guard Ambulation Distance (Feet): 300 Feet Assistive device: Rolling walker (2 wheeled) Gait Pattern/deviations: Step-through pattern;Decreased stride length;Trunk flexed     General Gait Details: pt did better today with avoiding obstacles, verbal cues for position from RW, posture   Stairs            Wheelchair Mobility    Modified  Rankin (Stroke Patients Only)       Balance                                    Cognition Arousal/Alertness: Awake/alert Behavior During Therapy: Flat affect Overall Cognitive Status: No family/caregiver present to determine baseline cognitive functioning Area of Impairment: Problem solving;Safety/judgement         Safety/Judgement: Decreased awareness of safety   Problem Solving: Slow processing;Requires verbal cues      Exercises      General Comments        Pertinent Vitals/Pain Pain Assessment: No/denies pain    Home Living                      Prior Function            PT Goals (current goals can now be found in the care plan section) Acute Rehab PT Goals PT Goal Formulation: With patient Time For Goal Achievement: 07/10/16 Potential to Achieve Goals: Good Progress towards PT goals: Progressing toward goals    Frequency    Min 3X/week      PT Plan Current plan remains appropriate    Co-evaluation             End of Session Equipment Utilized During Treatment: Gait belt Activity Tolerance: Other (comment) (pt with ?syncope episode) Patient left: in bed;with call bell/phone within reach;with bed alarm set     Time: 8101-7510 PT Time Calculation (  min) (ACUTE ONLY): 21 min  Charges:  $Gait Training: 8-22 mins                    G Codes:      [name] Michael,[name] Michael 07-22-2016, 12:55 PM Carmelia Bake, PT, DPT July 22, 2016 Pager: [phone]

## 2016-06-27 LAB — RENAL FUNCTION PANEL
Albumin: 2.6 g/dL — ABNORMAL LOW (ref 3.5–5.0)
Anion gap: 12 (ref 5–15)
BUN: 67 mg/dL — ABNORMAL HIGH (ref 6–20)
CO2: 25 mmol/L (ref 22–32)
CREATININE: 2.15 mg/dL — AB (ref 0.44–1.00)
Calcium: 8.7 mg/dL — ABNORMAL LOW (ref 8.9–10.3)
Chloride: 105 mmol/L (ref 101–111)
GFR calc Af Amer: 29 mL/min — ABNORMAL LOW (ref >60)
GFR, EST NON AFRICAN AMERICAN: 25 mL/min — AB (ref >60)
GLUCOSE: 151 mg/dL — AB (ref 65–99)
PHOSPHORUS: 4.6 mg/dL (ref 2.5–4.6)
POTASSIUM: 4.5 mmol/L (ref 3.5–5.1)
Sodium: 142 mmol/L (ref 135–145)

## 2016-06-27 LAB — CBC WITH DIFFERENTIAL/PLATELET
BASOS ABS: 0.1 10*3/uL (ref 0.0–0.1)
Basophils Relative: 0 %
EOS ABS: 0.3 10*3/uL (ref 0.0–0.7)
Eosinophils Relative: 1 %
HCT: 30.8 % — ABNORMAL LOW (ref 36.0–46.0)
Hemoglobin: 9.7 g/dL — ABNORMAL LOW (ref 12.0–15.0)
LYMPHS ABS: 2 10*3/uL (ref 0.7–4.0)
LYMPHS PCT: 12 %
MCH: 26.5 pg (ref 26.0–34.0)
MCHC: 31.5 g/dL (ref 30.0–36.0)
MCV: 84.2 fL (ref 78.0–100.0)
MONO ABS: 1 10*3/uL (ref 0.1–1.0)
Monocytes Relative: 6 %
Neutro Abs: 14 10*3/uL — ABNORMAL HIGH (ref 1.7–7.7)
Neutrophils Relative %: 81 %
Platelets: 576 10*3/uL — ABNORMAL HIGH (ref 150–400)
RBC: 3.66 MIL/uL — AB (ref 3.87–5.11)
RDW: 15.4 % (ref 11.5–15.5)
WBC: 17.3 10*3/uL — AB (ref 4.0–10.5)

## 2016-06-27 LAB — URINALYSIS, ROUTINE W REFLEX MICROSCOPIC
BACTERIA UA: NONE SEEN
Bilirubin Urine: NEGATIVE
GLUCOSE, UA: NEGATIVE mg/dL
Hgb urine dipstick: NEGATIVE
Ketones, ur: NEGATIVE mg/dL
Nitrite: NEGATIVE
PH: 7 (ref 5.0–8.0)
Protein, ur: NEGATIVE mg/dL
SPECIFIC GRAVITY, URINE: 1.005 (ref 1.005–1.030)
Squamous Epithelial / HPF: NONE SEEN

## 2016-06-27 LAB — GLUCOSE, CAPILLARY
GLUCOSE-CAPILLARY: 131 mg/dL — AB (ref 65–99)
Glucose-Capillary: 161 mg/dL — ABNORMAL HIGH (ref 65–99)
Glucose-Capillary: 141 mg/dL — ABNORMAL HIGH (ref 65–99)
Glucose-Capillary: 102 mg/dL — ABNORMAL HIGH (ref 65–99)

## 2016-06-27 LAB — PROCALCITONIN: Procalcitonin: 0.42 ng/mL

## 2016-06-27 MED ORDER — METOPROLOL TARTRATE 25 MG PO TABS
37.5000 mg | ORAL_TABLET | Freq: Two times a day (BID) | ORAL | Status: DC
  Administered 2016-06-27 – 2016-06-29 (×4): 37.5 mg via ORAL
  Filled 2016-06-27 (×4): qty 2

## 2016-06-27 NOTE — Progress Notes (Signed)
Physical Therapy Treatment Patient Details Name: [name] Michael MRN: [id number] DOB: [birth year] Today's Date: 06/27/2016    History of Present Illness [name] Michael is a 56 y.o. female with medical history significant of diabetes mellitus, hyperlipidemia, hypertension, stroke. 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  Pt had soiled linen so assisted to bathroom and provided pericare and barrier cream (appears to have MASD).  Therapist has continually requested pt call for assist if needing to use bathroom and reiterated this again today.  Educated pt that laying in soiled linen can cause skin breakdown.  Pt also performed ambulation in hallway and did well with mobility today.     Follow Up Recommendations  Home health PT;Supervision for mobility/OOB     Equipment Recommendations  Rolling walker with 5" wheels (youth)    Recommendations for Other Services       Precautions / Restrictions Precautions Precautions: Fall    Mobility  Bed Mobility Overal bed mobility: Needs Assistance Bed Mobility: Sit to Supine       Sit to supine: Supervision      Transfers Overall transfer level: Needs assistance Equipment used: Rolling walker (2 wheeled) Transfers: Sit to/from Stand Sit to Stand: Min guard         General transfer comment: verbal cues for technique  Ambulation/Gait Ambulation/Gait assistance: Min guard Ambulation Distance (Feet): 300 Feet Assistive device: Rolling walker (2 wheeled) Gait Pattern/deviations: Step-through pattern;Decreased stride length;Trunk flexed     General Gait Details: pt did better today with avoiding obstacles, verbal cues for position from RW, posture, no LE buckling    Stairs            Wheelchair Mobility     Modified Rankin (Stroke Patients Only)       Balance                                    Cognition Arousal/Alertness: Awake/alert Behavior During Therapy: Flat affect Overall Cognitive Status: No family/caregiver present to determine baseline cognitive functioning Area of Impairment: Problem solving;Safety/judgement         Safety/Judgement: Decreased awareness of safety   Problem Solving: Slow processing;Requires verbal cues      Exercises      General Comments        Pertinent Vitals/Pain Pain Assessment: No/denies pain    Home Living                      Prior Function            PT Goals (current goals can now be found in the care plan section) Progress towards PT goals: Progressing toward goals    Frequency    Min 3X/week      PT Plan Current plan remains appropriate    Co-evaluation             End of Session Equipment Utilized During Treatment: Gait belt Activity Tolerance: Patient tolerated treatment well Patient left: in bed;with call bell/phone within reach;with bed alarm set     Time: 1440-1453 PT Time Calculation (min) (ACUTE ONLY): 13 min  Charges:  $Gait Training: 8-22 mins  G Codes:      Rayven Hendrickson,KATHrine E 07-Jul-2016, 4:30 PM Carmelia Bake, PT, DPT 07-07-16 Pager: [phone]

## 2016-06-27 NOTE — NC FL2 (Signed)
[place] LEVEL OF CARE SCREENING TOOL     IDENTIFICATION  Patient Name: [name] Michael Birthdate: 02-Dec-1960 Sex: female Admission Date (Current Location): 06/11/2016  [place] and [place] Number:  [name] Michael [id number] (pending - application submitted 71/2 through [employer]) Facility and Address:  [facility],  [place] [address] [place]      Provider Number: [id number]  Attending Physician Name and Address:  Florencia Reasons, MD  Relative Name and Phone Number:       Current Level of Care: Hospital Recommended Level of Care: [place] Prior Approval Number:    Date Approved/Denied:   PASRR Number:    Discharge Plan:      Current Diagnoses: Patient Active Problem List   Diagnosis Date Noted  . CHF (congestive heart failure) ([place])   . Abdominal pain   . Colitis   . Hypophosphatemia 06/12/2016  . Hypokalemia 06/11/2016  . Hypocalcemia 06/11/2016  . Acute kidney injury ([place]) 06/11/2016  . Proliferative diabetic retinopathy ([place]) 04/29/2016  . Closed fracture of left distal radius 01/28/2016  . Poor social situation 03/09/2013  . Depression 03/01/2013  . Abnormal mammogram 12/20/2012  . Retinal detachment 11/17/2012  . Diabetes ([place]) 02/09/2007  . Hyperlipidemia 02/09/2007  . Essential hypertension 02/09/2007  . GERD 02/09/2007    Orientation RESPIRATION BLADDER Height & Weight     Self, Time, Situation, Place  Normal Incontinent Weight: 82 lb 9.6 oz (37.5 kg) Height:  4' 6.5" (138.4 cm)  BEHAVIORAL SYMPTOMS/MOOD NEUROLOGICAL BOWEL NUTRITION STATUS      Incontinent Diet (Renal/Carb Modified)  AMBULATORY STATUS COMMUNICATION OF NEEDS Skin   Limited Assist Verbally Normal                       Personal Care Assistance Level of Assistance  Bathing, Dressing Bathing Assistance: Limited assistance   Dressing Assistance: Limited assistance     Functional Limitations Info             SPECIAL  CARE FACTORS FREQUENCY  PT (By licensed PT), OT (By licensed OT)                    Contractures      Additional Factors Info  Code Status, Allergies Code Status Info: DNR Allergies Info: Hydrocodone           Current Medications (06/27/2016):  This is the current hospital active medication list Current Facility-Administered Medications  Medication Dose Route Frequency Provider Last Rate Last Dose  . 0.9 %  sodium chloride infusion   Intravenous Continuous Annita Brod, MD 100 mL/hr at 06/27/16 1248    . acetaminophen (TYLENOL) tablet 650 mg  650 mg Oral Q6H PRN Gardiner Barefoot, NP   650 mg at 06/23/16 2222   Or  . acetaminophen (TYLENOL) suppository 650 mg  650 mg Rectal Q6H PRN Gardiner Barefoot, NP      . atorvastatin (LIPITOR) tablet 20 mg  20 mg Oral Daily Velvet Bathe, MD   20 mg at 06/27/16 3382  . calcium carbonate (TUMS - dosed in mg elemental calcium) chewable tablet 400 mg of elemental calcium  2 tablet Oral BID WC Reyne Dumas, MD   400 mg of elemental calcium at 06/27/16 0929  . feeding supplement (NEPRO CARB STEADY) liquid 237 mL  237 mL Oral BID BM Annita Brod, MD   237 mL at 06/27/16 1400  . hydrALAZINE (APRESOLINE) injection 10 mg  10 mg Intravenous Q6H PRN Annita Brod, MD      . insulin aspart (novoLOG) injection 0-15 Units  0-15 Units Subcutaneous TID WC Velvet Bathe, MD   3 Units at 06/27/16 1248  . insulin aspart (novoLOG) injection 0-5 Units  0-5 Units Subcutaneous QHS Velvet Bathe, MD   2 Units at 06/24/16 2159  . insulin aspart protamine- aspart (NOVOLOG MIX 70/30) injection 28 Units  28 Units Subcutaneous BID WC Annita Brod, MD   28 Units at 06/27/16 [phone]  . iopamidol (ISOVUE-300) 61 % injection 15 mL  15 mL Oral BID Florencia Reasons, MD   15 mL at 06/26/16 2000  . iopamidol (ISOVUE-300) 61 % injection 30 mL  30 mL Oral Once PRN Florencia Reasons, MD      . metoprolol tartrate (LOPRESSOR) tablet 25 mg  25 mg Oral BID Tacey Ruiz, MD   25 mg  at 06/27/16 8527  . multivitamin with minerals tablet 1 tablet  1 tablet Oral Daily Velvet Bathe, MD   1 tablet at 06/27/16 [phone]  . pregabalin (LYRICA) capsule 25 mg  25 mg Oral BID Annita Brod, MD   25 mg at 06/27/16 2353  . sodium bicarbonate tablet 650 mg  650 mg Oral BID Florencia Reasons, MD   650 mg at 06/27/16 0929  . sodium chloride flush (NS) 0.9 % injection 3 mL  3 mL Intravenous Q12H Velvet Bathe, MD   3 mL at 06/26/16 2257  . zinc oxide 20 % ointment   Topical PRN Gardiner Barefoot, NP         Discharge Medications: Please see discharge summary for a list of discharge medications.  Relevant Imaging Results:  Relevant Lab Results:   Additional Information SSN: [id number]  Standley Brooking, LCSW

## 2016-06-27 NOTE — Progress Notes (Addendum)
PROGRESS NOTE  [name] Michael DPO:[id number] DOB: [birth year] DOA: 06/11/2016 PCP: Arnoldo Morale, MD  HPI: 56 year old female past oral history diabetes mellitus, hyperlipidemia, hypertension and CVA admitted on 12/20 after having several days of nausea vomiting, diarrhea and weakness. Patient found to have acute kidney injury and sepsis secondary to Escherichia coli UTI causing pyelonephritis. Sepsis stabilized and patient's in about a change to by mouth, however with persistent fever and high white count, we started IV antibiotics and placed on meropenem. CT scan of abdomen and pelvis ruled out any kind of abscess and only showed nonobstructive stones. Urology consulted for possible obstructive uropathy, but at this time, they recommended no urology intervention. Was put on IV Lasix for final overload and then started developing worsening renal failure so nephrology consulted. Lasix stopped and gentle IV fluids started.   Foley catheter removed on 12/31. Patient without fever since then. Her white count is slowly improving although her renal function continues to improve. Started developing hyperkalemia so given Kayexalate on 1/1. She was complaining of some bilateral foot tingling so her home Lyrica was restarted. Today she says that is improved. She complains of some abdominal distention but no diarrhea or abdominal pain.  Subjectives last 24hrs:  Remain weak, she denies pain. No fever, but persistent leukocytosis.  impaired vision, impaired memory at baseline, urinary incontinence at baseline   Sister states her husband (patient's brother in law) is not willing to take patient back to their home due to her baseline incontinence and it made their house with bad odor   Assessment/Plan:  Sepsis secondary to Escherichia coli UTI/pyelonephritis/ecoli bacteremia:  Patient met criteria for sepsis on admission given leukocytosis, fever, urinary source and ecoli bacteremia with tachycardia.    Repeat Urine culture with no growth from 1/1 , repeat blood culture no growth patient has been on 2 weeks of antibiotics (from 12/21 to 1/2) with zosyn, rocephin then Meropenem).   She is had a Foley catheter for 8 days which was discontinued on 12/31.   Due to persistent leukocytosis, repeat ct chest /ab/pel on 1/4 which revealed "Edematous enlarged kidneys suggests ongoing inflammation or infection. Persistent moderate perinephric fat stranding. No gross hydronephrosis or hydroureter. Multiple punctate intrarenal calculi again visualized." Will repeat ua, blood culture, procacitonin.  Spep/upep ordered.  May consider infectious disease consult. Or re consult nephrology.   Acute kidney injury [facility]): from uti/pyelonephritis  urine output was good prior to foley removal on 12/31.  Currently not able to record I/O's due to Patient is incontience of urine.   Cr has been at 2 for the last three days, may be her new baseline. Nephrology has signed off.  She is continued on gently increased IV fluids. D/c oral mag.  Mild hyperkalemia:  iv sodium bicarb on 1/4, Start sodium bicarb supplement due to metabolic acidosis. k normalized with bicarb supplement.  Hypocalcemia: Replacing,  ironized calcium, vit d level in process   Hypophosphatemia/hyperphosphetemia : initially, s/p Replacement, has a few highs, now wnl, continue monitor. pth in process.   Colitis:  Ct ab + colitis on 12/20, with abdominal pain. She received abx, colitis has resolved on Repeat ct ab on 12/26,    Abdomen more distended although nontender and no diarrhea. white count remain elevated ,repeat ct ab/pel on 1/4 no mention of colitis, but bilateral kidney remain enlarged.   Insulin dependent Diabetes ([place]):  Uncontrolled with peripheral neuropathy complications: E3X done last month at 10.9.  Glipizide metformin on hold , cr remain elevated,  may not able to resume oral meds at discharge  on insulin 70/30, Continue adjust  insulin  Proliferative Diabetic Retinopathy/retinal detachment: lost vision from left eye, barely able to see from right eye.  Sister report her eye doctor is in the process of getting her on disability, she is followed by ophthalmologist [facility] Dwana [facility]  Anemia: iron deficiency Baseline hgb around 05-04-12 in the last few years Her hgb was 7.6 on admission Stool FOBT negative tbili wnl, b12 wnl She received prbc transfusion x2 on 12/22 hgb 9.7 on 1/5.  Diastolic CHF (congestive heart failure) ([place]):  receiving IV fluids. No edema   Essential hypertension: Trending upwards. Increase lopressor  To 37.78m bid, Have increased when necessary hydralazine   GERD: PPI  H/o CVA, with impaired memory, was seen by Dr sLeonie Manin the past, not sure if patient is compliant with meds or medical follow up, per sister it is very hard for her to go to see a doctor, until she lost her vision. She does not take asa or plavix, although in the past she was discharged on plavix, will clarify with patient and sister  FTT: generalized weakness, very frail, impaired vision at baseline. Poor memory at baseline home health vs placement, she used to live with her sister, now her sister does not want her back, care manager consulted.   she only weight 37.5kg ( 82pounds), but Body mass index is 19.55 kg/m.   Code Status: DO NOT RESUSCITATE   Family Communication: Patient declined for me to call any family , today on 1/5 sister is in the hospital , so I was able to get more information.    Disposition Plan: Here until renal function improves and infection fully resolved.   She presumably was living with her sister and she says that her sister does not want her to come back. This may be difficult as she does not have any insurance she needs a place to go. She has impaired vision, too weak to walk, she has poor memory. Per sister she is in the process get patient on disability  due to patient now is legally blind. Sister is also working on getting patient medicaid. Social worker and cTourist information centre managerinput appreciated.  Consultants:  Urology, no intervention per urology  Nephrology   Procedures:   None  Antimicrobials:  IV Rocephin 12/20-12/22  IV Zosyn 12/22-12/24  IV Ceftin 12/24-12/26  IV meropenem 12/26-1/2  DVT prophylaxis:  SCDs   Objective: Vitals:   06/26/16 0448 06/26/16 1300 06/26/16 2100 06/27/16 0353  BP: 123/66 134/65 139/85 (!) 175/76  Pulse: 87 98 65 93  Resp: 18 18 18 18   Temp: 98 F (36.7 C) 98.4 F (36.9 C) 98.2 F (36.8 C) 98.4 F (36.9 C)  TempSrc: Oral Oral Oral Oral  SpO2: 100% 100% 100% 98%  Weight: 37.5 kg (82 lb 9.6 oz)     Height:        Intake/Output Summary (Last 24 hours) at 06/27/16 0844 Last data filed at 06/27/16 0801  Gross per 24 hour  Intake             2640 ml  Output                0 ml  Net             2640 ml   Filed Weights   06/24/16 0530 06/25/16 0620 06/26/16 0448  Weight: 36.5 kg (80 lb 7.5 oz)  37.4 kg (82 lb 7.2 oz) 37.5 kg (82 lb 9.6 oz)    Exam:   General:  Very frail and weak, oriented x3 though , + hirsutism, poor historian, poor vision  Cardiovascular: regular rate and rhythm, S1-S2   Respiratory: diminished at basis  Abdomen: distended, nontender, hypoactive bowel sounds  Musculoskeletal: no clubbing or cyanosis, trace edema   Skin: no skin breaks, tears or lesions  Psychiatry: flat affect, no evidence of psychoses    Data Reviewed: CBC:  Recent Labs Lab 06/23/16 0510 06/24/16 0323 06/25/16 0529 06/26/16 0446 06/27/16 0527  WBC 19.8* 18.9* 18.1* 18.4* 17.3*  NEUTROABS  --   --   --   --  14.0*  HGB 11.1* 10.7* 10.5* 10.1* 9.7*  HCT 34.4* 33.0* 32.3* 31.4* 30.8*  MCV 83.5 83.5 83.7 84.9 84.2  PLT 476* 498* 545* 553* 371*   Basic Metabolic Panel:  Recent Labs Lab 06/23/16 0510 06/24/16 0323 06/25/16 0529 06/26/16 0446 06/27/16 0527  NA 136 134*  135  136 139 142  K 5.6* 5.2* 5.0  5.0 5.3* 4.5  CL 104 101 103  104 108 105  CO2 22 22 21*  21* 19* 25  GLUCOSE 94 289* 134*  136* 82 151*  BUN 68* 67* 71*  73* 73* 67*  CREATININE 2.55* 2.21* 2.03*  2.02* 2.02* 2.15*  CALCIUM 9.1 8.5* 8.7*  8.7* 8.7* 8.7*  PHOS  --  5.7* 4.7* 4.3 4.6   GFR: Estimated Creatinine Clearance: 15.4 mL/min (by C-G formula based on SCr of 2.15 mg/dL (H)). Liver Function Tests:  Recent Labs Lab 06/24/16 0323 06/25/16 0529 06/26/16 0446 06/27/16 0527  ALBUMIN 2.3* 2.3* 2.4* 2.6*   No results for input(s): LIPASE, AMYLASE in the last 168 hours. No results for input(s): AMMONIA in the last 168 hours. Coagulation Profile: No results for input(s): INR, PROTIME in the last 168 hours. Cardiac Enzymes: No results for input(s): CKTOTAL, CKMB, CKMBINDEX, TROPONINI in the last 168 hours. BNP (last 3 results) No results for input(s): PROBNP in the last 8760 hours. HbA1C: No results for input(s): HGBA1C in the last 72 hours. CBG:  Recent Labs Lab 06/26/16 0753 06/26/16 1122 06/26/16 1702 06/26/16 2159 06/27/16 0748  GLUCAP 74 360* 70 87 131*   Lipid Profile: No results for input(s): CHOL, HDL, LDLCALC, TRIG, CHOLHDL, LDLDIRECT in the last 72 hours. Thyroid Function Tests: No results for input(s): TSH, T4TOTAL, FREET4, T3FREE, THYROIDAB in the last 72 hours. Anemia Panel: No results for input(s): VITAMINB12, FOLATE, FERRITIN, TIBC, IRON, RETICCTPCT in the last 72 hours. Urine analysis:    Component Value Date/Time   COLORURINE STRAW (A) 06/19/2016 0911   APPEARANCEUR CLEAR 06/19/2016 0911   LABSPEC 1.003 (L) 06/19/2016 0911   PHURINE 7.0 06/19/2016 0911   GLUCOSEU >=500 (A) 06/19/2016 0911   HGBUR MODERATE (A) 06/19/2016 0911   BILIRUBINUR NEGATIVE 06/19/2016 0911   BILIRUBINUR n 05/25/2012 1202   KETONESUR NEGATIVE 06/19/2016 0911   PROTEINUR NEGATIVE 06/19/2016 0911   UROBILINOGEN 0.2 05/25/2012 1202   UROBILINOGEN 0.2 03/29/2011  0131   NITRITE NEGATIVE 06/19/2016 0911   LEUKOCYTESUR LARGE (A) 06/19/2016 0911   Sepsis Labs: '@LABRCNTIP'$ (procalcitonin:4,lacticidven:4)  ) Recent Results (from the past 240 hour(s))  Culture, blood (Routine X 2) w Reflex to ID Panel     Status: None   Collection Time: 06/20/16 10:20 AM  Result Value Ref Range Status   Specimen Description BLOOD RIGHT ARM  Final   Special Requests BOTTLES DRAWN AEROBIC AND ANAEROBIC  5CC  Final   Culture   Final    NO GROWTH 5 DAYS Performed at [facility]    Report Status 06/25/2016 FINAL  Final  Culture, blood (Routine X 2) w Reflex to ID Panel     Status: None   Collection Time: 06/20/16 10:22 AM  Result Value Ref Range Status   Specimen Description BLOOD LEFT ARM  Final   Special Requests BOTTLES DRAWN AEROBIC AND ANAEROBIC 10CC  Final   Culture   Final    NO GROWTH 5 DAYS Performed at [facility]    Report Status 06/25/2016 FINAL  Final  Urine culture     Status: None   Collection Time: 06/22/16  9:40 AM  Result Value Ref Range Status   Specimen Description URINE, CLEAN CATCH  Final   Special Requests NONE  Final   Culture NO GROWTH Performed at [facility]   Final   Report Status 06/23/2016 FINAL  Final      Studies: Ct Abdomen Pelvis Wo Contrast  Result Date: 06/26/2016 CLINICAL DATA:  History of diabetes hypertension CVA, pyelonephritis and sepsis EXAM: CT CHEST, ABDOMEN AND PELVIS WITHOUT CONTRAST TECHNIQUE: Multidetector CT imaging of the chest, abdomen and pelvis was performed following the standard protocol without IV contrast. COMPARISON:  06/17/2016, 06/14/2016 FINDINGS: CT CHEST FINDINGS Cardiovascular: Limited evaluation without intravenous contrast. Mild atherosclerotic calcifications. No aneurysmal dilatation. Mild coronary artery calcifications. Normal heart size. No large pericardial effusion. Mediastinum/Nodes: Thyroid gland within normal limits. Trachea is midline. No significantly enlarged  mediastinal lymph nodes. Limited evaluation for hilar nodes without contrast. Esophagus is unremarkable. Lungs/Pleura: Lungs are clear. No pleural effusion or pneumothorax. Musculoskeletal: No chest wall mass or suspicious bone lesions identified. CT ABDOMEN PELVIS FINDINGS Hepatobiliary: No focal hepatic abnormality. Gallbladder is contracted. Small stone is not as well appreciated on the current exam. No wall thickening. No biliary dilatation. Pancreas: Unremarkable. No pancreatic ductal dilatation or surrounding inflammatory changes. Small calcifications near the ends an aunt process of the pancreas as before. Spleen: Normal in size without focal abnormality. Adrenals/Urinary Tract: Adrenal glands are within normal limits. Bilateral nephromegaly. Moderate perinephric fat stranding as before. No significant hydronephrosis. Multiple punctate intrarenal calculi. No definitive ureteral stones. Bladder has a small amount of air anteriorly. Bladder is dilated. Stomach/Bowel: Moderate stool in the colon. No wall thickening. Normal appendix. No dilated small bowel. Vascular/Lymphatic: Aortic atherosclerosis. No enlarged abdominal or pelvic lymph nodes. Reproductive: Uterus and bilateral adnexa are unremarkable. Other: Small amount of free fluid in the upper abdomen. No free air. Musculoskeletal: No acute or significant osseous findings. IMPRESSION: 1. No acute infiltrate or effusion in the chest. 2. Edematous enlarged kidneys suggests ongoing inflammation or infection. Persistent moderate perinephric fat stranding. No gross hydronephrosis or hydroureter. Multiple punctate intrarenal calculi again visualized. 3. Small calcified gallstone is difficult to appreciate on current exam. The gallbladder is contracted. Electronically Signed   By: Jasmine Pang M.D.   On: 06/26/2016 20:45   Ct Chest Wo Contrast  Result Date: 06/26/2016 CLINICAL DATA:  History of diabetes hypertension CVA, pyelonephritis and sepsis EXAM: CT  CHEST, ABDOMEN AND PELVIS WITHOUT CONTRAST TECHNIQUE: Multidetector CT imaging of the chest, abdomen and pelvis was performed following the standard protocol without IV contrast. COMPARISON:  06/17/2016, 06/14/2016 FINDINGS: CT CHEST FINDINGS Cardiovascular: Limited evaluation without intravenous contrast. Mild atherosclerotic calcifications. No aneurysmal dilatation. Mild coronary artery calcifications. Normal heart size. No large pericardial effusion. Mediastinum/Nodes: Thyroid gland within normal limits. Trachea is midline. No  significantly enlarged mediastinal lymph nodes. Limited evaluation for hilar nodes without contrast. Esophagus is unremarkable. Lungs/Pleura: Lungs are clear. No pleural effusion or pneumothorax. Musculoskeletal: No chest wall mass or suspicious bone lesions identified. CT ABDOMEN PELVIS FINDINGS Hepatobiliary: No focal hepatic abnormality. Gallbladder is contracted. Small stone is not as well appreciated on the current exam. No wall thickening. No biliary dilatation. Pancreas: Unremarkable. No pancreatic ductal dilatation or surrounding inflammatory changes. Small calcifications near the ends an aunt process of the pancreas as before. Spleen: Normal in size without focal abnormality. Adrenals/Urinary Tract: Adrenal glands are within normal limits. Bilateral nephromegaly. Moderate perinephric fat stranding as before. No significant hydronephrosis. Multiple punctate intrarenal calculi. No definitive ureteral stones. Bladder has a small amount of air anteriorly. Bladder is dilated. Stomach/Bowel: Moderate stool in the colon. No wall thickening. Normal appendix. No dilated small bowel. Vascular/Lymphatic: Aortic atherosclerosis. No enlarged abdominal or pelvic lymph nodes. Reproductive: Uterus and bilateral adnexa are unremarkable. Other: Small amount of free fluid in the upper abdomen. No free air. Musculoskeletal: No acute or significant osseous findings. IMPRESSION: 1. No acute infiltrate  or effusion in the chest. 2. Edematous enlarged kidneys suggests ongoing inflammation or infection. Persistent moderate perinephric fat stranding. No gross hydronephrosis or hydroureter. Multiple punctate intrarenal calculi again visualized. 3. Small calcified gallstone is difficult to appreciate on current exam. The gallbladder is contracted. Electronically Signed   By: Donavan Foil M.D.   On: 06/26/2016 20:45   US Renal  Result Date: 06/26/2016 CLINICAL DATA:  Elevated creatinine.  Abnormal CT. EXAM: RENAL / URINARY TRACT ULTRASOUND COMPLETE COMPARISON:  CT 06/17/2016. FINDINGS: Right Kidney: Length: 12.5 cm. Echogenicity within normal limits. No mass or hydronephrosis visualized. Left Kidney: Length: 12.1 cm. Echogenicity within normal limits. No mass or hydronephrosis visualized. Bladder: Appears normal for degree of bladder distention. IMPRESSION: No focal renal abnormality identified. No evidence of renal or perirenal abscess. No evidence of hydronephrosis . Electronically Signed   By: Marcello Moores  Register   On: 06/26/2016 12:34    Scheduled Meds: . atorvastatin  20 mg Oral Daily  . calcium carbonate  2 tablet Oral BID WC  . feeding supplement (NEPRO CARB STEADY)  237 mL Oral BID BM  . insulin aspart  0-15 Units Subcutaneous TID WC  . insulin aspart  0-5 Units Subcutaneous QHS  . insulin aspart protamine- aspart  28 Units Subcutaneous BID WC  . iopamidol  15 mL Oral BID  . metoprolol tartrate  25 mg Oral BID  . multivitamin with minerals  1 tablet Oral Daily  . pregabalin  25 mg Oral BID  . sodium bicarbonate  650 mg Oral BID  . sodium chloride flush  3 mL Intravenous Q12H    Continuous Infusions: . sodium chloride 100 mL/hr at 06/26/16 2256     LOS: 16 days   Time spent: 66mns  Dekota Kirlin, MD PhD [facility] Pager [phone] If 7PM-7AM, please contact night-coverage [email or web address] Password [email or web address]1/10/2016, 8:44 AM

## 2016-06-27 NOTE — Clinical Social Work Placement (Signed)
CSW received consult for discharge planning & spoke with patient's sister - [name] via phone who states that her husband/patient's brother-in-law will not allow patient to return to their home at discharge as patient is incontinent and they do not feel comfortable taking care of patient. CSW spoke with Holli Humbles, Financial Counselor who has confirmed with DSS Medicaid worker - Daisy Lazar (ph#: [phone]) that patient had submitted application for Medicaid on 05/28/16 (pending #: [id number]), but that "patient has yet to be deemed disability by [facility] CSW put call out to [facility] to see if they would be willing to take patient as Medicaid pending, sent referral - awaiting call back.   CSW spoke with patient/sister about shelters/[facility] [place], but patient's sister/Dr. Erlinda Hong does not feel patient would be appropriate for shelter at this time.   CSW will continue to follow for possible placement.    Raynaldo Opitz, [facility] Clinical Social Worker cell #: [phone]    CLINICAL SOCIAL WORK PLACEMENT  NOTE  Date:  06/27/2016  Patient Details  Name: [name] Michael MRN: [id number] Date of Birth: July 07, 1960  Clinical Social Work is seeking post-discharge placement for this patient at the [place] level of care (*CSW will initial, date and re-position this form in  chart as items are completed):  Yes   Patient/family provided with [place] Work Department's list of facilities offering this level of care within the geographic area requested by the patient (or if unable, by the patient's family).  Yes   Patient/family informed of their freedom to choose among providers that offer the needed level of care, that participate in Medicare, Medicaid or managed care program needed by the patient, have an available bed and are willing to accept the patient.  Yes   Patient/family informed of [facility]'s ownership interest in  [facility] and [facility], as well as of the fact that they are under no obligation to receive care at these facilities.  PASRR submitted to EDS on       PASRR number received on       Existing PASRR number confirmed on       FL2 transmitted to all facilities in geographic area requested by pt/family on 06/27/16     FL2 transmitted to all facilities within larger geographic area on       Patient informed that his/her managed care company has contracts with or will negotiate with certain facilities, including the following:            Patient/family informed of bed offers received.  Patient chooses bed at       Physician recommends and patient chooses bed at      Patient to be transferred to   on  .  Patient to be transferred to facility by       Patient family notified on   of transfer.  Name of family member notified:        PHYSICIAN       Additional Comment:    _______________________________________________ Standley Brooking, LCSW 06/27/2016, 3:38 PM

## 2016-06-28 LAB — RENAL FUNCTION PANEL
ALBUMIN: 2.5 g/dL — AB (ref 3.5–5.0)
ANION GAP: 10 (ref 5–15)
BUN: 75 mg/dL — ABNORMAL HIGH (ref 6–20)
CALCIUM: 8.5 mg/dL — AB (ref 8.9–10.3)
CO2: 22 mmol/L (ref 22–32)
Chloride: 105 mmol/L (ref 101–111)
Creatinine, Ser: 2.24 mg/dL — ABNORMAL HIGH (ref 0.44–1.00)
GFR calc non Af Amer: 23 mL/min — ABNORMAL LOW (ref >60)
GFR, EST AFRICAN AMERICAN: 27 mL/min — AB (ref >60)
Glucose, Bld: 64 mg/dL — ABNORMAL LOW (ref 65–99)
PHOSPHORUS: 3.4 mg/dL (ref 2.5–4.6)
POTASSIUM: 4.1 mmol/L (ref 3.5–5.1)
SODIUM: 137 mmol/L (ref 135–145)

## 2016-06-28 LAB — C-REACTIVE PROTEIN: CRP: 9 mg/dL — ABNORMAL HIGH (ref <1.0)

## 2016-06-28 LAB — VITAMIN D 25 HYDROXY (VIT D DEFICIENCY, FRACTURES): VIT D 25 HYDROXY: 32.9 ng/mL (ref 30.0–100.0)

## 2016-06-28 LAB — SEDIMENTATION RATE: SED RATE: 110 mm/h — AB (ref 0–22)

## 2016-06-28 LAB — GLUCOSE, CAPILLARY
GLUCOSE-CAPILLARY: 88 mg/dL (ref 65–99)
Glucose-Capillary: 120 mg/dL — ABNORMAL HIGH (ref 65–99)
Glucose-Capillary: 68 mg/dL (ref 65–99)
Glucose-Capillary: 211 mg/dL — ABNORMAL HIGH (ref 65–99)
Glucose-Capillary: 209 mg/dL — ABNORMAL HIGH (ref 65–99)

## 2016-06-28 LAB — CALCIUM, IONIZED: Calcium, Ionized, Serum: 4.8 mg/dL (ref 4.5–5.6)

## 2016-06-28 LAB — PARATHYROID HORMONE, INTACT (NO CA): PTH: 23 pg/mL (ref 15–65)

## 2016-06-28 MED ORDER — INSULIN ASPART PROT & ASPART (70-30 MIX) 100 UNIT/ML ~~LOC~~ SUSP
20.0000 [IU] | Freq: Two times a day (BID) | SUBCUTANEOUS | Status: DC
  Administered 2016-06-28 – 2016-07-01 (×7): 20 [IU] via SUBCUTANEOUS

## 2016-06-28 MED ORDER — POLYETHYLENE GLYCOL 3350 17 G PO PACK
17.0000 g | PACK | Freq: Every day | ORAL | Status: DC
  Administered 2016-06-28 – 2016-07-01 (×4): 17 g via ORAL
  Filled 2016-06-28 (×4): qty 1

## 2016-06-28 MED ORDER — SENNOSIDES-DOCUSATE SODIUM 8.6-50 MG PO TABS
1.0000 | ORAL_TABLET | Freq: Two times a day (BID) | ORAL | Status: DC
  Administered 2016-06-28 – 2016-07-01 (×7): 1 via ORAL
  Filled 2016-06-28 (×7): qty 1

## 2016-06-28 NOTE — Progress Notes (Signed)
Occupational Therapy Treatment Patient Details Name: LARIZA COTHRON MRN: [id number] DOB: 1961-05-04 Today's Date: 06/28/2016    History of present illness AMILLION SCOBEE is a 56 y.o. female with medical history significant of diabetes mellitus, hyperlipidemia, hypertension, stroke. 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 Pt needed min A with walker safety     Follow Up Recommendations  Home health OT;Supervision/Assistance - 24 hour;SNF    Equipment Recommendations  None recommended by OT       Precautions / Restrictions Precautions Precautions: Fall       Mobility Bed Mobility Overal bed mobility: Needs Assistance Bed Mobility: Sit to Supine     Supine to sit: Min guard     General bed mobility comments: cues for safety.  Transfers   Equipment used: Conservation officer, nature (2 wheeled) Transfers: Sit to/from [employer] to Stand: Min guard Stand pivot transfers: Min guard       General transfer comment: verbal cues for technique        ADL Overall ADL's : Needs assistance/impaired     Grooming: Wash/dry hands;Standing;Min guard;Brushing hair                   Toilet Transfer: RW;Ambulation;Cueing for sequencing;Cueing for safety;Comfort height toilet;Minimal assistance Armed forces technical officer Details (indicate cue type and reason): pt did walk to BR this day as well as in room Toileting- Clothing Manipulation and Hygiene: Minimal assistance;Sit to/from stand;Cueing for sequencing;Cueing for safety       Functional mobility during ADLs: Minimal assistance;Moderate assistance General ADL Comments: pt overall min - mod A with ADL activity - feel pt will need ALF as she will need A with her ADL's/ Pt aware of agrees. Pt did share with OT  she is unaware of when she had to have a BM                Cognition   Behavior During Therapy: [facility] for tasks assessed/performed Overall Cognitive Status: No family/caregiver present to determine baseline cognitive functioning                         Exercises     Shoulder Instructions            Frequency  Min 2X/week        Progress Toward Goals  OT Goals(current goals can now be found in the care plan section)  Progress towards OT goals: Progressing toward goals     Plan Discharge plan needs to be updated    Co-evaluation                 End of Session Equipment Utilized During Treatment: Rolling walker   Activity Tolerance Patient tolerated treatment well   Patient Left with bed alarm set;with call bell/phone within reach;in chair   Nurse Communication Mobility status        Time: 1497-0263 OT Time Calculation (min): 25 min  Charges: OT General Charges $OT Visit: 1 Procedure OT Treatments $Self Care/Home Management : 23-37 mins  Yamilet Mcfayden, Thereasa Parkin 06/28/2016, 1:56 PM

## 2016-06-28 NOTE — Progress Notes (Signed)
Admit: 06/11/2016 LOS: 60  19F with AKI after E. colit UTI and bacteremia  Subjective:  Called back to eval for worsenedi n SCr  Creat (mg/dL)  Date Value  12/20/2012 0.56   Creatinine, Ser (mg/dL)  Date Value  06/28/2016 2.24 (H)  06/27/2016 2.15 (H)  06/26/2016 2.02 (H)  06/25/2016 2.03 (H)  06/25/2016 2.02 (H)  06/24/2016 2.21 (H)  06/23/2016 2.55 (H)  06/22/2016 2.58 (H)  06/21/2016 2.83 (H)  06/20/2016 2.89 (H)  ]    No IV contrast. No ACEi.  No recent diuretics. Foley placed yesterday and brisk UOP.  1/4 CT A/P w/o hydronephrosis, kidneys appears swollen with stranding.  Pt denies back pain. Some mild abd pain.  Not on ABX currently, repeat U Cx and B Cx negative.   01/05 0701 - 01/06 0700 In: 2640 [P.O.:240; I.V.:2400] Out: 1925 [YDXAJ:2878]  Filed Weights   06/25/16 0620 06/26/16 0448 06/28/16 0504  Weight: 37.4 kg (82 lb 7.2 oz) 37.5 kg (82 lb 9.6 oz) 38 kg (83 lb 12.4 oz)    Scheduled Meds: . atorvastatin  20 mg Oral Daily  . calcium carbonate  2 tablet Oral BID WC  . feeding supplement (NEPRO CARB STEADY)  237 mL Oral BID BM  . insulin aspart  0-15 Units Subcutaneous TID WC  . insulin aspart  0-5 Units Subcutaneous QHS  . insulin aspart protamine- aspart  20 Units Subcutaneous BID WC  . metoprolol tartrate  37.5 mg Oral BID  . multivitamin with minerals  1 tablet Oral Daily  . polyethylene glycol  17 g Oral Daily  . pregabalin  25 mg Oral BID  . senna-docusate  1 tablet Oral BID  . sodium bicarbonate  650 mg Oral BID  . sodium chloride flush  3 mL Intravenous Q12H   Continuous Infusions: . sodium chloride 100 mL/hr at 06/28/16 1021   PRN Meds:.acetaminophen **OR** acetaminophen, hydrALAZINE, iopamidol, zinc oxide  Current Labs: reviewed    Physical Exam:  Blood pressure (!) 184/82, pulse (!) 103, temperature 98.2 F (36.8 C), temperature source Oral, resp. rate 15, height 4' 6.5" (1.384 m), weight 38 kg (83 lb 12.4 oz), last menstrual period  03/06/2013, SpO2 97 %. NAD chronically ill appearing RRR CTAB, diminished in bases No LEE Foley Cath in place NCAT PERl No rashes/lesions  A 1. AKI, nonoliguric 2. E. Coli UTI and Bacteremia, cultures cleared 3. Some persistent stranding of kidneys on 1/4 CT 4. Hyperkalemia and acidosis improved on NaHCO3 5. DM2 6. Anemia 7. HTN 8. Hx/o CVA 9. FTT/Debility  P 1. Might just be at her current baseline, no recent toxic insults 2. Great UOP after foley but no obstruction on CT 1/4; keep in for now 3. No other changes needed right now 4. Daily weights, Daily Renal Panel, Strict I/Os, Avoid nephrotoxins (NSAIDs, judicious IV Contrast)  5. Will continue to follow   Pearson Grippe MD 06/28/2016, 12:15 PM   Recent Labs Lab 06/26/16 0446 06/27/16 0527 06/28/16 0358  NA 139 142 137  K 5.3* 4.5 4.1  CL 108 105 105  CO2 19* 25 22  GLUCOSE 82 151* 64*  BUN 73* 67* 75*  CREATININE 2.02* 2.15* 2.24*  CALCIUM 8.7* 8.7* 8.5*  PHOS 4.3 4.6 3.4    Recent Labs Lab 06/25/16 0529 06/26/16 0446 06/27/16 0527  WBC 18.1* 18.4* 17.3*  NEUTROABS  --   --  14.0*  HGB 10.5* 10.1* 9.7*  HCT 32.3* 31.4* 30.8*  MCV 83.7 84.9 84.2  PLT  545* 553* 576*             

## 2016-06-28 NOTE — Progress Notes (Signed)
PROGRESS NOTE  [name] Michael WCH:[id number] DOB: 03/06/1961 DOA: 06/11/2016 PCP: Arnoldo Morale, MD  HPI: 56 year old female past oral history diabetes mellitus, hyperlipidemia, hypertension and CVA admitted on 12/20 after having several days of nausea vomiting, diarrhea and weakness. Patient found to have acute kidney injury and sepsis secondary to Escherichia coli UTI causing pyelonephritis. Sepsis stabilized and patient's in about a change to by mouth, however with persistent fever and high white count, we started IV antibiotics and placed on meropenem. CT scan of abdomen and pelvis ruled out any kind of abscess and only showed nonobstructive stones. Urology consulted for possible obstructive uropathy, but at this time, they recommended no urology intervention. Was put on IV Lasix for final overload and then started developing worsening renal failure so nephrology consulted. Lasix stopped and gentle IV fluids started.   Foley catheter removed on 12/31. Patient without fever since then. Her white count is slowly improving although her renal function continues to improve. Started developing hyperkalemia so given Kayexalate on 1/1. She was complaining of some bilateral foot tingling so her home Lyrica was restarted. Today she says that is improved. She complains of some abdominal distention but no diarrhea or abdominal pain.  Subjectives last 24hrs:  Remain weak, she denies pain. No fever, but persistent leukocytosis.  impaired vision, impaired memory at baseline, urinary incontinence at baseline   Sister states her husband (patient's brother in law) is not willing to take patient back to their home due to her baseline incontinence and it made their house with bad odor   Assessment/Plan:  Sepsis secondary to Escherichia coli UTI/pyelonephritis/ecoli bacteremia:  Patient met criteria for sepsis on admission given leukocytosis, fever, urinary source and ecoli bacteremia with tachycardia.    Repeat Urine culture with no growth from 1/1 , repeat blood culture no growth patient has been on 2 weeks of antibiotics (from 12/21 to 1/2) with zosyn, rocephin then Meropenem).   She is had a Foley catheter for 8 days which was discontinued on 12/31.  Reinserted on 1/5 for 24hrs urine collection. (baseline incontinence) Due to persistent leukocytosis, repeat ct chest /ab/pel on 1/4 which revealed "Edematous enlarged kidneys suggests ongoing inflammation or infection. Persistent moderate perinephric fat stranding. No gross hydronephrosis or hydroureter. Multiple punctate intrarenal calculi again visualized."  repeat ua showed improvement, repeat urine culture and blood culture pedning, procacitonin.  Spep/upep ordered.   re consulted nephrology on 1/6 due to cr worsenning, Dr Moshe Cipro will see patient today on 1/6.   Acute kidney injury [facility]): from uti/pyelonephritis  urine output was good prior to foley removal on 12/31.  Reinserted on 1/5 for 24hrs urine collection. (baseline incontinence).   Cr has been at 2 for the last three days from 1/2 to 1/4, thought this could be  new baseline.  However, cr now started to trending up again, repeat ct ab/ple showed edematous enlarged kidneys suggest ongoing inflammation  Or infection.  repeat ua, spep/upep, Nephrology reconsulted on 1/6.  She is continued on  IV fluids.   Mild hyperkalemia:  iv sodium bicarb on 1/4, Start sodium bicarb supplement due to metabolic acidosis. k normalized with bicarb supplement.  Hypocalcemia: Replacing,  ironized calcium, vit d level in process   Hypophosphatemia/hyperphosphetemia : initially, s/p Replacement, has a few highs, now wnl, continue monitor. pth wnl.   Colitis:  Ct ab + colitis on 12/20, with abdominal pain. She received abx, colitis has resolved on Repeat ct ab on 12/26,    Abdomen more distended although nontender  and no diarrhea. white count remain elevated ,repeat ct ab/pel on 1/4 no mention of  colitis, but bilateral kidney remain enlarged.   Insulin dependent Diabetes ([place]):  Uncontrolled with peripheral neuropathy complications: K5L done last month at 10.9.  Glipizide metformin on hold , cr remain elevated, may not able to resume oral meds at discharge  on insulin 70/30,  has poor oral intake, hyopoglycemia, decrease insulin  Proliferative Diabetic Retinopathy/retinal detachment: lost vision from left eye, barely able to see from right eye.  Sister report her eye doctor is in the process of getting her on disability, she is followed by ophthalmologist Dr Dwana [facility] at [facility]  Anemia: iron deficiency Baseline hgb around 05-04-12 in the last few years Her hgb was 7.6 on admission Stool FOBT negative tbili wnl, b12 wnl She received prbc transfusion x2 on 12/22 hgb 9.7 on 1/5.  Diastolic CHF (congestive heart failure) ([place]):  receiving IV fluids. No edema   Essential hypertension: Trending upwards. Increase lopressor  To 37.55m bid, Have increased when necessary hydralazine   GERD: PPI  H/o CVA, with impaired memory, was seen by Dr sLeonie Manin the past, not sure if patient is compliant with meds or medical follow up, per sister it is very hard for her to go to see a doctor, until she lost her vision. She does not take asa or plavix, although in the past she was discharged on plavix, will clarify with patient and sister  FTT: generalized weakness, very frail, impaired vision at baseline. Poor memory at baseline home health vs placement, she used to live with her sister, now her sister does not want her back, care manager consulted.   she only weight 37.5kg ( 82pounds), but Body mass index is 19.83 kg/m.   Code Status: DO NOT RESUSCITATE   Family Communication: Patient declined for me to call any family . on 1/5 sister is in the hospital , so I was able to get more information.    Disposition Plan: Here until renal function improves and  infection fully resolved.   She was living with her sister for the last year, now her sister does not want her to come back.   she does not have any insurance .  She has impaired vision, too weak to walk, she has poor memory. 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 Sister is also working on getting patient medicaid. Social worker and cTourist information centre managerinput appreciated.  Consultants:  Urology, no intervention per urology  Nephrology   Procedures:   None  Antimicrobials:  IV Rocephin 12/20-12/22  IV Zosyn 12/22-12/24  IV Ceftin 12/24-12/26  IV meropenem 12/26-1/2  DVT prophylaxis:  SCDs   Objective: Vitals:   06/27/16 1300 06/27/16 2101 06/28/16 0454 06/28/16 0504  BP: (!) 148/60 137/69 (!) 184/82   Pulse: (!) 104 (!) 108 (!) 103   Resp: 18 16 15    Temp: 98 F (36.7 C) 98.3 F (36.8 C) 98.2 F (36.8 C)   TempSrc: Oral Oral Oral   SpO2: 98% 94% 97%   Weight:    38 kg (83 lb 12.4 oz)  Height:        Intake/Output Summary (Last 24 hours) at 06/28/16 0913 Last data filed at 06/28/16 0600  Gross per 24 hour  Intake             2400 ml  Output  1925 ml  Net              475 ml   Filed Weights   06/25/16 0620 06/26/16 0448 06/28/16 0504  Weight: 37.4 kg (82 lb 7.2 oz) 37.5 kg (82 lb 9.6 oz) 38 kg (83 lb 12.4 oz)    Exam:   General:  Very frail and weak, oriented x3 though , + hirsutism, poor historian, poor vision  Cardiovascular: regular rate and rhythm, S1-S2   Respiratory: diminished at basis  Abdomen: distended, nontender, hypoactive bowel sounds  Musculoskeletal: no clubbing or cyanosis, trace edema   Skin: no skin breaks, tears or lesions  Psychiatry: flat affect, no evidence of psychoses    Data Reviewed: CBC:  Recent Labs Lab 06/23/16 0510 06/24/16 0323 06/25/16 0529 06/26/16 0446 06/27/16 0527  WBC 19.8* 18.9* 18.1* 18.4* 17.3*    NEUTROABS  --   --   --   --  14.0*  HGB 11.1* 10.7* 10.5* 10.1* 9.7*  HCT 34.4* 33.0* 32.3* 31.4* 30.8*  MCV 83.5 83.5 83.7 84.9 84.2  PLT 476* 498* 545* 553* 546*   Basic Metabolic Panel:  Recent Labs Lab 06/24/16 0323 06/25/16 0529 06/26/16 0446 06/27/16 0527 06/28/16 0358  NA 134* 135  136 139 142 137  K 5.2* 5.0  5.0 5.3* 4.5 4.1  CL 101 103  104 108 105 105  CO2 22 21*  21* 19* 25 22  GLUCOSE 289* 134*  136* 82 151* 64*  BUN 67* 71*  73* 73* 67* 75*  CREATININE 2.21* 2.03*  2.02* 2.02* 2.15* 2.24*  CALCIUM 8.5* 8.7*  8.7* 8.7* 8.7* 8.5*  PHOS 5.7* 4.7* 4.3 4.6 3.4   GFR: Estimated Creatinine Clearance: 14.7 mL/min (by C-G formula based on SCr of 2.24 mg/dL (H)). Liver Function Tests:  Recent Labs Lab 06/24/16 0323 06/25/16 0529 06/26/16 0446 06/27/16 0527 06/28/16 0358  ALBUMIN 2.3* 2.3* 2.4* 2.6* 2.5*   No results for input(s): LIPASE, AMYLASE in the last 168 hours. No results for input(s): AMMONIA in the last 168 hours. Coagulation Profile: No results for input(s): INR, PROTIME in the last 168 hours. Cardiac Enzymes: No results for input(s): CKTOTAL, CKMB, CKMBINDEX, TROPONINI in the last 168 hours. BNP (last 3 results) No results for input(s): PROBNP in the last 8760 hours. HbA1C: No results for input(s): HGBA1C in the last 72 hours. CBG:  Recent Labs Lab 06/27/16 1150 06/27/16 1737 06/27/16 2115 06/28/16 0739 06/28/16 0825  GLUCAP 161* 141* 102* 68 120*   Lipid Profile: No results for input(s): CHOL, HDL, LDLCALC, TRIG, CHOLHDL, LDLDIRECT in the last 72 hours. Thyroid Function Tests: No results for input(s): TSH, T4TOTAL, FREET4, T3FREE, THYROIDAB in the last 72 hours. Anemia Panel: No results for input(s): VITAMINB12, FOLATE, FERRITIN, TIBC, IRON, RETICCTPCT in the last 72 hours. Urine analysis:    Component Value Date/Time   COLORURINE STRAW (A) 06/27/2016 1800   APPEARANCEUR CLEAR 06/27/2016 1800   LABSPEC 1.005 06/27/2016  1800   PHURINE 7.0 06/27/2016 1800   GLUCOSEU NEGATIVE 06/27/2016 1800   HGBUR NEGATIVE 06/27/2016 1800   BILIRUBINUR NEGATIVE 06/27/2016 1800   BILIRUBINUR n 05/25/2012 1202   KETONESUR NEGATIVE 06/27/2016 1800   PROTEINUR NEGATIVE 06/27/2016 1800   UROBILINOGEN 0.2 05/25/2012 1202   UROBILINOGEN 0.2 03/29/2011 0131   NITRITE NEGATIVE 06/27/2016 1800   LEUKOCYTESUR MODERATE (A) 06/27/2016 1800   Sepsis Labs: '@LABRCNTIP'$ (procalcitonin:4,lacticidven:4)  ) Recent Results (from the past 240 hour(s))  Culture, blood (Routine X 2) w Reflex to ID  Panel     Status: None   Collection Time: 06/20/16 10:20 AM  Result Value Ref Range Status   Specimen Description BLOOD RIGHT ARM  Final   Special Requests BOTTLES DRAWN AEROBIC AND ANAEROBIC 5CC  Final   Culture   Final    NO GROWTH 5 DAYS Performed at [facility]    Report Status 06/25/2016 FINAL  Final  Culture, blood (Routine X 2) w Reflex to ID Panel     Status: None   Collection Time: 06/20/16 10:22 AM  Result Value Ref Range Status   Specimen Description BLOOD LEFT ARM  Final   Special Requests BOTTLES DRAWN AEROBIC AND ANAEROBIC 10CC  Final   Culture   Final    NO GROWTH 5 DAYS Performed at [facility]    Report Status 06/25/2016 FINAL  Final  Urine culture     Status: None   Collection Time: 06/22/16  9:40 AM  Result Value Ref Range Status   Specimen Description URINE, CLEAN CATCH  Final   Special Requests NONE  Final   Culture NO GROWTH Performed at [facility]   Final   Report Status 06/23/2016 FINAL  Final      Studies: No results found.  Scheduled Meds: . atorvastatin  20 mg Oral Daily  . calcium carbonate  2 tablet Oral BID WC  . feeding supplement (NEPRO CARB STEADY)  237 mL Oral BID BM  . insulin aspart  0-15 Units Subcutaneous TID WC  . insulin aspart  0-5 Units Subcutaneous QHS  . insulin aspart protamine- aspart  20 Units Subcutaneous BID WC  . metoprolol tartrate  37.5 mg Oral  BID  . multivitamin with minerals  1 tablet Oral Daily  . pregabalin  25 mg Oral BID  . sodium bicarbonate  650 mg Oral BID  . sodium chloride flush  3 mL Intravenous Q12H    Continuous Infusions: . sodium chloride 100 mL/hr at 06/27/16 1248     LOS: 17 days   Time spent: 38mns  [name] Deupree, MD PhD [facility] Pager [phone] If 7PM-7AM, please contact night-coverage [email or web address] Password [email or web address] 06/28/2016, 9:13 AM

## 2016-06-29 DIAGNOSIS — N390 Urinary tract infection, site not specified: Secondary | ICD-10-CM

## 2016-06-29 DIAGNOSIS — A419 Sepsis, unspecified organism: Secondary | ICD-10-CM

## 2016-06-29 DIAGNOSIS — E08311 Diabetes mellitus due to underlying condition with unspecified diabetic retinopathy with macular edema: Secondary | ICD-10-CM

## 2016-06-29 DIAGNOSIS — K523 Indeterminate colitis: Secondary | ICD-10-CM

## 2016-06-29 DIAGNOSIS — R7881 Bacteremia: Secondary | ICD-10-CM

## 2016-06-29 DIAGNOSIS — N10 Acute tubulo-interstitial nephritis: Secondary | ICD-10-CM

## 2016-06-29 DIAGNOSIS — IMO0002 Reserved for concepts with insufficient information to code with codable children: Secondary | ICD-10-CM

## 2016-06-29 DIAGNOSIS — E1165 Type 2 diabetes mellitus with hyperglycemia: Secondary | ICD-10-CM

## 2016-06-29 DIAGNOSIS — A498 Other bacterial infections of unspecified site: Secondary | ICD-10-CM

## 2016-06-29 DIAGNOSIS — Z1612 Extended spectrum beta lactamase (ESBL) resistance: Secondary | ICD-10-CM

## 2016-06-29 DIAGNOSIS — B9620 Unspecified Escherichia coli [E. coli] as the cause of diseases classified elsewhere: Secondary | ICD-10-CM

## 2016-06-29 DIAGNOSIS — B9629 Other Escherichia coli [E. coli] as the cause of diseases classified elsewhere: Secondary | ICD-10-CM

## 2016-06-29 DIAGNOSIS — I5032 Chronic diastolic (congestive) heart failure: Secondary | ICD-10-CM

## 2016-06-29 DIAGNOSIS — E118 Type 2 diabetes mellitus with unspecified complications: Secondary | ICD-10-CM

## 2016-06-29 DIAGNOSIS — I5033 Acute on chronic diastolic (congestive) heart failure: Secondary | ICD-10-CM

## 2016-06-29 LAB — BASIC METABOLIC PANEL
Anion gap: 9 (ref 5–15)
BUN: 75 mg/dL — ABNORMAL HIGH (ref 6–20)
CALCIUM: 8.6 mg/dL — AB (ref 8.9–10.3)
CHLORIDE: 105 mmol/L (ref 101–111)
CO2: 22 mmol/L (ref 22–32)
CREATININE: 2.37 mg/dL — AB (ref 0.44–1.00)
GFR calc Af Amer: 25 mL/min — ABNORMAL LOW (ref >60)
GFR calc non Af Amer: 22 mL/min — ABNORMAL LOW (ref >60)
GLUCOSE: 178 mg/dL — AB (ref 65–99)
Potassium: 4.6 mmol/L (ref 3.5–5.1)
Sodium: 136 mmol/L (ref 135–145)

## 2016-06-29 LAB — IRON AND TIBC
IRON: 24 ug/dL — AB (ref 28–170)
Saturation Ratios: 9 % — ABNORMAL LOW (ref 10.4–31.8)
TIBC: 255 ug/dL (ref 250–450)
UIBC: 231 ug/dL

## 2016-06-29 LAB — CBC WITH DIFFERENTIAL/PLATELET
Basophils Absolute: 0 10*3/uL (ref 0.0–0.1)
Basophils Relative: 0 %
Eosinophils Absolute: 0.4 10*3/uL (ref 0.0–0.7)
Eosinophils Relative: 2 %
HEMATOCRIT: 29 % — AB (ref 36.0–46.0)
HEMOGLOBIN: 9.2 g/dL — AB (ref 12.0–15.0)
LYMPHS ABS: 2.4 10*3/uL (ref 0.7–4.0)
LYMPHS PCT: 16 %
MCH: 27.1 pg (ref 26.0–34.0)
MCHC: 31.7 g/dL (ref 30.0–36.0)
MCV: 85.3 fL (ref 78.0–100.0)
MONO ABS: 0.7 10*3/uL (ref 0.1–1.0)
MONOS PCT: 5 %
NEUTROS ABS: 11.3 10*3/uL — AB (ref 1.7–7.7)
Neutrophils Relative %: 77 %
Platelets: 555 10*3/uL — ABNORMAL HIGH (ref 150–400)
RBC: 3.4 MIL/uL — ABNORMAL LOW (ref 3.87–5.11)
RDW: 16.1 % — AB (ref 11.5–15.5)
WBC: 14.8 10*3/uL — ABNORMAL HIGH (ref 4.0–10.5)

## 2016-06-29 LAB — URINE CULTURE: CULTURE: NO GROWTH

## 2016-06-29 LAB — GLUCOSE, CAPILLARY
GLUCOSE-CAPILLARY: 190 mg/dL — AB (ref 65–99)
GLUCOSE-CAPILLARY: 260 mg/dL — AB (ref 65–99)
Glucose-Capillary: 151 mg/dL — ABNORMAL HIGH (ref 65–99)
Glucose-Capillary: 178 mg/dL — ABNORMAL HIGH (ref 65–99)

## 2016-06-29 LAB — RETICULOCYTES
RBC.: 3.41 MIL/uL — ABNORMAL LOW (ref 3.87–5.11)
Retic Count, Absolute: 81.8 10*3/uL (ref 19.0–186.0)
Retic Ct Pct: 2.4 % (ref 0.4–3.1)

## 2016-06-29 LAB — FERRITIN: Ferritin: 1102 ng/mL — ABNORMAL HIGH (ref 11–307)

## 2016-06-29 LAB — LACTATE DEHYDROGENASE: LDH: 141 U/L (ref 98–192)

## 2016-06-29 LAB — FOLATE: Folate: 38 ng/mL (ref >5.9)

## 2016-06-29 LAB — PROCALCITONIN: Procalcitonin: 0.42 ng/mL

## 2016-06-29 LAB — VITAMIN B12: Vitamin B-12: 273 pg/mL (ref 180–914)

## 2016-06-29 MED ORDER — DEXTROSE-NACL 5-0.9 % IV SOLN
INTRAVENOUS | Status: DC
  Administered 2016-06-29: 50 mL/h via INTRAVENOUS

## 2016-06-29 MED ORDER — INSULIN ASPART 100 UNIT/ML ~~LOC~~ SOLN
0.0000 [IU] | SUBCUTANEOUS | Status: DC
  Administered 2016-06-30: 4 [IU] via SUBCUTANEOUS
  Administered 2016-06-30: 3 [IU] via SUBCUTANEOUS
  Administered 2016-06-30: 11 [IU] via SUBCUTANEOUS
  Administered 2016-06-30: 4 [IU] via SUBCUTANEOUS

## 2016-06-29 MED ORDER — METOPROLOL TARTRATE 50 MG PO TABS
50.0000 mg | ORAL_TABLET | Freq: Two times a day (BID) | ORAL | Status: DC
Start: 2016-06-29 — End: 2016-07-01
  Administered 2016-06-29 – 2016-07-01 (×4): 50 mg via ORAL
  Filled 2016-06-29 (×4): qty 1

## 2016-06-29 NOTE — Progress Notes (Signed)
PROGRESS NOTE    [name] Michael  MRN:[id number] DOB: 10/17/1960 DOA: 06/11/2016 PCP: Enobong, Amao, MD     Brief Narrative:  56-year-old WF PMHx CVA, DM Type 2 uncontrolled w/ complication, HLD, HTN,   Admitted on 12/20 after having several days of nausea vomiting, diarrhea and weakness. Patient found to have acute kidney injury and sepsis secondary to Escherichia coli UTI causing pyelonephritis. Sepsis stabilized and patient's in about a change to by mouth, however with persistent fever and high white count, we started IV antibiotics and placed on meropenem. CT scan of abdomen and pelvis ruled out any kind of abscess and only showed nonobstructive stones. Urology consulted for possible obstructive uropathy, but at this time, they recommended no urology intervention. Was put on IV Lasix for final overload and then started developing worsening renal failure so nephrology consulted. Lasix stopped and gentle IV fluids started.   Foley catheter removed on 12/31. Patient without fever since then. Her white count is slowly improving although her renal function continues to improve. Started developing hyperkalemia so given Kayexalate on 1/1. She was complaining of some bilateral foot tingling so her home Lyrica was restarted. Today she says that is improved. She complains of some abdominal distention but no diarrhea or abdominal pain.   Subjective: 1/7 /O 4, NAD, last 24 hours MAXIMUM TEMPERATURE 38.3C   Assessment & Plan:   Principal Problem:   Acute kidney injury (HCC) Active Problems:   Diabetes (HCC)   Essential hypertension   GERD   Hypokalemia   Hypocalcemia   Hypophosphatemia   Abdominal pain   Colitis   CHF (congestive heart failure) (HCC)   Sepsis/ positive Escherichia coli UTI/pyelonephritis/positive Escherichia coli Bacteremia:  -Patient met criteria for sepsis on admission given leukocytosis, fever, urinary source and ecoli bacteremia with tachycardia.  -Repeat Urine  culture NGTD:patient has been on 2 weeks of antibiotics (from 12/21 to 1/2) with zosyn, rocephin then Meropenem).  - She is had a Foley catheter for 8 days which was discontinued on 12/31.  Reinserted on 1/5 for 24hrs urine collection. (baseline incontinence) -Due to persistent leukocytosis, repeat ct chest /ab/pel on 1/4 which revealed "Edematous enlarged kidneys suggests ongoing inflammation or infection. Persistent moderate perinephric fat stranding. No gross hydronephrosis or hydroureter. Multiple punctate intrarenal calculi again visualized."  repeat ua showed improvement, repeat urine culture and blood culture pedning, procacitonin.  -Spep/upep ordered.   - reconsulted nephrology on 1/6 due to Cr worsenning, Dr Goldsborough will see patient today on 1/6.  Acute kidney injury  -UTI/Pyelonephritis  urine output was good prior to foley removal on 12/31.  Reinserted on 1/5 for 24hrs urine collection. (baseline incontinence).   -strict in and out -Cr has been at 2 for the last three days from 1/2 to 1/4, thought this could be  new baseline.   Lab Results  Component Value Date   CREATININE 2.37 (H) 06/29/2016   CREATININE 2.24 (H) 06/28/2016   CREATININE 2.15 (H) 06/27/2016  -Strict in and out since admission -4.6 L -Daily weight Filed Weights   06/26/16 0448 06/28/16 0504 06/29/16 0532  Weight: 37.5 kg (82 lb 9.6 oz) 38 kg (83 lb 12.4 oz) 37.9 kg (83 lb 8.9 oz)    Colitis:  -Ct ab + colitis on 12/20, with abdominal pain. She received abx, colitis has resolved on Repeat ct ab on 12/26,    -Nontender palpation -ct ab/pel on 1/4 no mention of colitis, but bilateral kidney remain enlarged.  DM type II uncontrolled with complications :  -  04/29/16 Hemoglobin A1c= 10.9 -Would not restart glipizide/metformin secondary to patient's renal failure and last data significant improvement i.e.Cr<1.4 -NovoLog 70/30  20 units BID -1/7 Increase resistant SSI  Proliferative Diabetic  Retinopathy/retinal detachment:  -lost vision from left eye, barely able to see from right eye.  -Sister report her eye doctor is in the process of getting her on disability, she is followed by ophthalmologist Dr Rajiv Shah at [facility]  Anemia: iron deficiency(Baseline hgb around 11->12->13 in the last few years) -Her hgb was 7.6 on admission -Stool FOBT negative -tbili wnl, b12 wnl -06/13/16 transfuse 2 units PRBC  Recent Labs Lab 06/23/16 0510 06/24/16 0323 06/25/16 0529 06/26/16 0446 06/27/16 0527 06/29/16 0539  HGB 11.1* 10.7* 10.5* 10.1* 9.7* 9.2*  -Slowly trending down no overt signs of GI bleed. -Occult blood 3 pending -Anemia panel pending  Chronic Diastolic CHF (Baseline weight?):   -Strict in and out since admission -2.7 L -Daily weight Filed Weights   06/26/16 0448 06/28/16 0504 06/29/16 0532  Weight: 37.5 kg (82 lb 9.6 oz) 38 kg (83 lb 12.4 oz) 37.9 kg (83 lb 8.9 oz)  -Metoprolol 50 mg BID   Essential hypertension:  -See CHF   Hx CVA,  -Residual impaired memory, seen by Dr Sethi in the past, not sure if patient is compliant with meds or medical follow up, per sister it is very hard for her to go to see a doctor, until she lost her vision. -She does not take asa or plavix, although in the past she was discharged on plavix, will clarify with patient and sister  FTT - generalized weakness, very frail, impaired vision at baseline. Poor memory at baseline home health vs placement, she used to live with her sister, now her sister does not want her back, care manager consulted. -Poor oral intake D5-0.9% saline 50ml/hr -Nutrition consult  Goals of care -PALLIATIVE CARE: Consult to discuss short-term vs long-term goals of care, hospice    DVT prophylaxis: SCD Code Status: DO NOT RESUSCITATE Family Communication: None Disposition Plan: Assurance family will not take patient back. LCSW working on placement   Consultants:    Urology, no intervention per urology  Nephrology   Procedures/Significant Events:  12/20 CT abdomen pelvis W contrast:- Kidneys appear an edematous bilaterally and there is moderate perinephric fat stranding, - Multiple punctate stones within bilateral kidneys without ureteral stone identified. -Multifocal areas of mild wall thickening involving the colon. There are fluid-filled loops of small bowel with suggested areas of mild wall thickening, findings could relate to enteritis/colitis of infectious or inflammatory etiology. 06/13/16 transfuse 2 units PRBC 12/26 CT abdomen pelvis W contrast:-Scattered areas of wall thickening seen in small bowel and colon on the previous study resolved in the interval and may well within related to underdistention previously. - Both kidneys remain enlarged and edematous with perinephric edema/inflammation. No hydroureteronephrosis.  -New bibasilar atelectasis with tiny bilateral pleural effusions. 06/17/16 Echocardiogram: LVEF= 65% to 70%. - (grade 2 diastolic dysfunction). 1/4 CT chest W contrast:-Edematous enlarged kidneys suggests ongoing inflammation or infection. Persistent moderate perinephric fat stranding. No gross hydronephrosis or hydroureter         VENTILATOR SETTINGS: NA   Cultures 12/20 urine positive Escherichia coli 12/20 MRSA by PCR negative 12/20 blood positive Escherichia coli 12/21 blood positive Escherichia coli 12/29 blood  12/31 urine negative 1/5 blood NGTD 1/5 urine negative     Antimicrobials: Anti-infectives    Start     Stop   06/17/16 1000    meropenem (MERREM) 500 mg in sodium chloride 0.9 % 50 mL IVPB  Status:  Discontinued     06/24/16 1008   06/15/16 1200  cefUROXime (CEFTIN) tablet 500 mg  Status:  Discontinued     06/17/16 1135   06/14/16 0408  piperacillin-tazobactam (ZOSYN) 3.375 GM/50ML IVPB    Comments:  Jennye Moccasin   : cabinet override   06/14/16 1614   06/13/16 1800  cefTRIAXone  (ROCEPHIN) 2 g in dextrose 5 % 50 mL IVPB  Status:  Discontinued     06/12/16 2257   06/12/16 2315  piperacillin-tazobactam (ZOSYN) IVPB 2.25 g  Status:  Discontinued     06/15/16 0915   06/12/16 2000  cefTRIAXone (ROCEPHIN) 1 g in dextrose 5 % 50 mL IVPB     06/12/16 2027   06/11/16 1800  cefTRIAXone (ROCEPHIN) 1 g in dextrose 5 % 50 mL IVPB  Status:  Discontinued     06/12/16 1852       Devices    LINES / TUBES:      Continuous Infusions:   Objective: Vitals:   06/28/16 0504 06/28/16 1300 06/28/16 2153 06/29/16 0532  BP:  (!) 146/59 (!) 157/77 (!) 152/69  Pulse:  87 (!) 112 100  Resp:  _0 Temp:  98.3 F (36.8 C) (!) 100.9 F (38.3 C) 98.7 F (37.1 C)  TempSrc:  Oral Oral Oral  SpO2:  100% 98% 94%  Weight: 38 kg (83 lb 12.4 oz)   37.9 kg (83 lb 8.9 oz)  Height:        Intake/Output Summary (Last 24 hours) at 06/29/16 1126 Last data filed at 06/29/16 0500  Gross per 24 hour  Intake             3400 ml  Output             2725 ml  Net              675 ml   Filed Weights   06/26/16 0448 06/28/16 0504 06/29/16 0532  Weight: 37.5 kg (82 lb 9.6 oz) 38 kg (83 lb 12.4 oz) 37.9 kg (83 lb 8.9 oz)    Examination:  General: A/O 4, cachectic, No acute respiratory distress Eyes: negative scleral hemorrhage, negative anisocoria, negative icterus ENT: Negative Runny nose, negative gingival bleeding, Neck:  Negative scars, masses, torticollis, lymphadenopathy, JVD Lungs: Clear to auscultation bilaterally without wheezes or crackles Cardiovascular: Regular rate and rhythm without murmur gallop or rub normal S1 and S2 Abdomen: negative abdominal pain, nondistended, positive soft, bowel sounds, no rebound, no ascites, no appreciable mass Extremities: No significant cyanosis, clubbing, or edema bilateral lower extremities Skin: Negative rashes, lesions, ulcers Psychiatric:  Negative depression, negative anxiety, negative fatigue, negative mania  Central nervous  system:  Cranial nerves II through XII intact, tongue/uvula midline, all extremities muscle strength 5/5, sensation intact throughout, negative dysarthria, negative expressive aphasia, negative receptive aphasia.  .     Data Reviewed: Care during the described time interval was provided by me .  I have reviewed this patient's available data, including medical history, events of note, physical examination, and all test results as part of my evaluation. I have personally reviewed and interpreted all radiology studies.  CBC:  Recent Labs Lab 06/24/16 0323 06/25/16 0529 06/26/16 0446 06/27/16 0527 06/29/16 0539  WBC 18.9* 18.1* 18.4* 17.3* 14.8*  NEUTROABS  --   --   --  14.0* 11.3*  HGB 10.7* 10.5* 10.1* 9.7* 9.2*  HCT  33.0* 32.3* 31.4* 30.8* 29.0*  MCV 83.5 83.7 84.9 84.2 85.3  PLT 498* 545* 553* 576* 427*   Basic Metabolic Panel:  Recent Labs Lab 06/24/16 0323 06/25/16 0529 06/26/16 0446 06/27/16 0527 06/28/16 0358 06/29/16 0539  NA 134* 135  136 139 142 137 136  K 5.2* 5.0  5.0 5.3* 4.5 4.1 4.6  CL 101 103  104 108 105 105 105  CO2 22 21*  21* 19* _0 GLUCOSE 289* 134*  136* 82 151* 64* 178*  BUN 67* 71*  73* 73* 67* 75* 75*  CREATININE 2.21* 2.03*  2.02* 2.02* 2.15* 2.24* 2.37*  CALCIUM 8.5* 8.7*  8.7* 8.7* 8.7* 8.5* 8.6*  PHOS 5.7* 4.7* 4.3 4.6 3.4  --    GFR: Estimated Creatinine Clearance: 13.9 mL/min (by C-G formula based on SCr of 2.37 mg/dL (H)). Liver Function Tests:  Recent Labs Lab 06/24/16 0323 06/25/16 0529 06/26/16 0446 06/27/16 0527 06/28/16 0358  ALBUMIN 2.3* 2.3* 2.4* 2.6* 2.5*   No results for input(s): LIPASE, AMYLASE in the last 168 hours. No results for input(s): AMMONIA in the last 168 hours. Coagulation Profile: No results for input(s): INR, PROTIME in the last 168 hours. Cardiac Enzymes: No results for input(s): CKTOTAL, CKMB, CKMBINDEX, TROPONINI in the last 168 hours. BNP (last 3 results) No results for input(s):  PROBNP in the last 8760 hours. HbA1C: No results for input(s): HGBA1C in the last 72 hours. CBG:  Recent Labs Lab 06/28/16 0825 06/28/16 1141 06/28/16 1631 06/28/16 2147 06/29/16 0751  GLUCAP 120* 211* 209* 88 151*   Lipid Profile: No results for input(s): CHOL, HDL, LDLCALC, TRIG, CHOLHDL, LDLDIRECT in the last 72 hours. Thyroid Function Tests: No results for input(s): TSH, T4TOTAL, FREET4, T3FREE, THYROIDAB in the last 72 hours. Anemia Panel: No results for input(s): VITAMINB12, FOLATE, FERRITIN, TIBC, IRON, RETICCTPCT in the last 72 hours. Sepsis Labs:  Recent Labs Lab 06/27/16 0859 06/29/16 0539  PROCALCITON 0.42 0.42    Recent Results (from the past 240 hour(s))  Culture, blood (Routine X 2) w Reflex to ID Panel     Status: None   Collection Time: 06/20/16 10:20 AM  Result Value Ref Range Status   Specimen Description BLOOD RIGHT ARM  Final   Special Requests BOTTLES DRAWN AEROBIC AND ANAEROBIC 5CC  Final   Culture   Final    NO GROWTH 5 DAYS Performed at [facility]    Report Status 06/25/2016 FINAL  Final  Culture, blood (Routine X 2) w Reflex to ID Panel     Status: None   Collection Time: 06/20/16 10:22 AM  Result Value Ref Range Status   Specimen Description BLOOD LEFT ARM  Final   Special Requests BOTTLES DRAWN AEROBIC AND ANAEROBIC 10CC  Final   Culture   Final    NO GROWTH 5 DAYS Performed at [facility]    Report Status 06/25/2016 FINAL  Final  Urine culture     Status: None   Collection Time: 06/22/16  9:40 AM  Result Value Ref Range Status   Specimen Description URINE, CLEAN CATCH  Final   Special Requests NONE  Final   Culture NO GROWTH Performed at [facility]   Final   Report Status 06/23/2016 FINAL  Final  Culture, blood (routine x 2)     Status: None (Preliminary result)   Collection Time: 06/27/16  8:59 AM  Result Value Ref Range Status   Specimen Description BLOOD LEFT ANTECUBITAL  Final   Special Requests  IN PEDIATRIC BOTTLE [place]  Final   Culture   Final    NO GROWTH 1 DAY Performed at [facility]    Report Status PENDING  Incomplete  Culture, blood (routine x 2)     Status: None (Preliminary result)   Collection Time: 06/27/16  9:02 AM  Result Value Ref Range Status   Specimen Description BLOOD RIGHT HAND  Final   Special Requests BOTTLES DRAWN AEROBIC ONLY [place]  Final   Culture   Final    NO GROWTH 1 DAY Performed at [facility]    Report Status PENDING  Incomplete  Urine culture     Status: None   Collection Time: 06/27/16  6:00 PM  Result Value Ref Range Status   Specimen Description URINE, CLEAN CATCH  Final   Special Requests NONE  Final   Culture NO GROWTH Performed at [facility]   Final   Report Status 06/29/2016 FINAL  Final         Radiology Studies: No results found.      Scheduled Meds: . atorvastatin  20 mg Oral Daily  . calcium carbonate  2 tablet Oral BID WC  . feeding supplement (NEPRO CARB STEADY)  237 mL Oral BID BM  . insulin aspart  0-15 Units Subcutaneous TID WC  . insulin aspart  0-5 Units Subcutaneous QHS  . insulin aspart protamine- aspart  20 Units Subcutaneous BID WC  . metoprolol tartrate  37.5 mg Oral BID  . multivitamin with minerals  1 tablet Oral Daily  . polyethylene glycol  17 g Oral Daily  . pregabalin  25 mg Oral BID  . senna-docusate  1 tablet Oral BID  . sodium bicarbonate  650 mg Oral BID  . sodium chloride flush  3 mL Intravenous Q12H   Continuous Infusions:   LOS: 18 days    Time spent:40 min    Evelynne Spiers, Geraldo Docker, MD [facility] Pager [phone]  If 7PM-7AM, please contact night-coverage [email or web address] Password [email or web address] 06/29/2016, 11:26 AM

## 2016-06-29 NOTE — Progress Notes (Signed)
Admit: 06/11/2016 LOS: 88  81F with AKI after E. colit UTI and bacteremia  Subjective:  Further good UOP on IVFs Having low grade fevers B and U Cx from 1/5 reviewed, U Cx NGF, B Cx NGTD Pt states eating and drinking well UA 1/5 No RBC, protein, lessened WBC/HPF   01/06 0701 - 01/07 0700 In: 3640 [P.O.:1340; I.V.:2300] Out: 2725 [Urine:2725]  Filed Weights   06/26/16 0448 06/28/16 0504 06/29/16 0532  Weight: 37.5 kg (82 lb 9.6 oz) 38 kg (83 lb 12.4 oz) 37.9 kg (83 lb 8.9 oz)    Scheduled Meds: . atorvastatin  20 mg Oral Daily  . calcium carbonate  2 tablet Oral BID WC  . feeding supplement (NEPRO CARB STEADY)  237 mL Oral BID BM  . insulin aspart  0-15 Units Subcutaneous TID WC  . insulin aspart  0-5 Units Subcutaneous QHS  . insulin aspart protamine- aspart  20 Units Subcutaneous BID WC  . metoprolol tartrate  37.5 mg Oral BID  . multivitamin with minerals  1 tablet Oral Daily  . polyethylene glycol  17 g Oral Daily  . pregabalin  25 mg Oral BID  . senna-docusate  1 tablet Oral BID  . sodium bicarbonate  650 mg Oral BID  . sodium chloride flush  3 mL Intravenous Q12H   Continuous Infusions: . sodium chloride 100 mL/hr at 06/28/16 1021   PRN Meds:.acetaminophen **OR** acetaminophen, hydrALAZINE, iopamidol, zinc oxide  Current Labs: reviewed    Physical Exam:  Blood pressure (!) 152/69, pulse 100, temperature 98.7 F (37.1 C), temperature source Oral, resp. rate 16, height 4' 6.5" (1.384 m), weight 37.9 kg (83 lb 8.9 oz), last menstrual period 03/06/2013, SpO2 94 %. NAD chronically ill appearing RRR CTAB, diminished in bases No LEE Foley Cath in place NCAT PERl No rashes/lesions  A 1. AKI, nonoliguric 2. E. Coli UTI and Bacteremia, cultures cleared 3. Some persistent stranding of kidneys on 1/4 CT 4. Hyperkalemia and acidosis improved on NaHCO3 5. DM2 6. Anemia 7. HTN 8. Hx/o CVA 9. FTT/Debility  P 1. Might just be at her current baseline, no recent  toxic insults 2. Great UOP after foley but no obstruction on CT 1/4; keep in for now 3. Stop IVFs to given good PO intake 4. Daily weights, Daily Renal Panel, Strict I/Os, Avoid nephrotoxins (NSAIDs, judicious IV Contrast)  5. Will continue to follow   Pearson Grippe MD 06/29/2016, 9:53 AM   Recent Labs Lab 06/26/16 0446 06/27/16 0527 06/28/16 0358 06/29/16 0539  NA 139 142 137 136  K 5.3* 4.5 4.1 4.6  CL 108 105 105 105  CO2 19* 25 22 22   GLUCOSE 82 151* 64* 178*  BUN 73* 67* 75* 75*  CREATININE 2.02* 2.15* 2.24* 2.37*  CALCIUM 8.7* 8.7* 8.5* 8.6*  PHOS 4.3 4.6 3.4  --     Recent Labs Lab 06/26/16 0446 06/27/16 0527 06/29/16 0539  WBC 18.4* 17.3* 14.8*  NEUTROABS  --  14.0* 11.3*  HGB 10.1* 9.7* 9.2*  HCT 31.4* 30.8* 29.0*  MCV 84.9 84.2 85.3  PLT 553* 576* 555*

## 2016-06-30 DIAGNOSIS — N179 Acute kidney failure, unspecified: Secondary | ICD-10-CM

## 2016-06-30 LAB — PROTEIN ELECTROPHORESIS, SERUM
A/G Ratio: 0.6 — ABNORMAL LOW (ref 0.7–1.7)
ALPHA-1-GLOBULIN: 0.5 g/dL — AB (ref 0.0–0.4)
ALPHA-2-GLOBULIN: 1.2 g/dL — AB (ref 0.4–1.0)
Albumin ELP: 2.3 g/dL — ABNORMAL LOW (ref 2.9–4.4)
Beta Globulin: 1 g/dL (ref 0.7–1.3)
GAMMA GLOBULIN: 1.4 g/dL (ref 0.4–1.8)
GLOBULIN, TOTAL: 4.1 g/dL — AB (ref 2.2–3.9)
PDF: 0
Total Protein ELP: 6.4 g/dL (ref 6.0–8.5)

## 2016-06-30 LAB — BASIC METABOLIC PANEL
Anion gap: 13 (ref 5–15)
BUN: 76 mg/dL — AB (ref 6–20)
CALCIUM: 9.5 mg/dL (ref 8.9–10.3)
CO2: 23 mmol/L (ref 22–32)
Chloride: 101 mmol/L (ref 101–111)
Creatinine, Ser: 2.62 mg/dL — ABNORMAL HIGH (ref 0.44–1.00)
GFR calc Af Amer: 23 mL/min — ABNORMAL LOW (ref >60)
GFR, EST NON AFRICAN AMERICAN: 19 mL/min — AB (ref >60)
GLUCOSE: 144 mg/dL — AB (ref 65–99)
Potassium: 5 mmol/L (ref 3.5–5.1)
Sodium: 137 mmol/L (ref 135–145)

## 2016-06-30 LAB — HAPTOGLOBIN: HAPTOGLOBIN: 394 mg/dL — AB (ref 34–200)

## 2016-06-30 LAB — MAGNESIUM: Magnesium: 2.2 mg/dL (ref 1.7–2.4)

## 2016-06-30 LAB — GLUCOSE, CAPILLARY
Glucose-Capillary: 111 mg/dL — ABNORMAL HIGH (ref 65–99)
Glucose-Capillary: 138 mg/dL — ABNORMAL HIGH (ref 65–99)
Glucose-Capillary: 166 mg/dL — ABNORMAL HIGH (ref 65–99)
Glucose-Capillary: 169 mg/dL — ABNORMAL HIGH (ref 65–99)
Glucose-Capillary: 273 mg/dL — ABNORMAL HIGH (ref 65–99)
Glucose-Capillary: 91 mg/dL (ref 65–99)

## 2016-06-30 LAB — OCCULT BLOOD X 1 CARD TO LAB, STOOL
FECAL OCCULT BLD: NEGATIVE
Fecal Occult Bld: NEGATIVE

## 2016-06-30 LAB — ERYTHROPOIETIN: ERYTHROPOIETIN: 15.3 m[IU]/mL (ref 2.6–18.5)

## 2016-06-30 MED ORDER — INSULIN ASPART 100 UNIT/ML ~~LOC~~ SOLN
0.0000 [IU] | Freq: Three times a day (TID) | SUBCUTANEOUS | Status: DC
  Administered 2016-07-01 (×2): 7 [IU] via SUBCUTANEOUS

## 2016-06-30 MED ORDER — INSULIN SYRINGE 31G X 5/16" 0.3 ML MISC
20.0000 [IU] | Freq: Two times a day (BID) | 1 refills | Status: DC
Start: 2016-06-30 — End: 2016-07-02

## 2016-06-30 MED ORDER — SODIUM BICARBONATE 650 MG PO TABS
650.0000 mg | ORAL_TABLET | Freq: Two times a day (BID) | ORAL | 0 refills | Status: DC
Start: 2016-06-30 — End: 2018-06-09

## 2016-06-30 MED ORDER — METOPROLOL TARTRATE 50 MG PO TABS: 50.0000 mg | ORAL_TABLET | Freq: Two times a day (BID) | ORAL | 0 refills | Status: DC

## 2016-06-30 MED ORDER — INSULIN ASPART PROT & ASPART (70-30 MIX) 100 UNIT/ML ~~LOC~~ SUSP: 20.0000 [IU] | Freq: Two times a day (BID) | SUBCUTANEOUS | 11 refills | Status: DC

## 2016-06-30 MED ORDER — CALCIUM CARBONATE ANTACID 500 MG PO CHEW: 2.0000 | CHEWABLE_TABLET | Freq: Two times a day (BID) | ORAL | 0 refills | Status: DC

## 2016-06-30 NOTE — Discharge Summary (Signed)
Physician Discharge Summary  [name] Michael UUV:[id number] DOB: 12/18/1960 DOA: 06/11/2016  PCP: Arnoldo Morale, MD  Admit date: 06/11/2016 Discharge date: 07/01/2016  Admitted From: Home Disposition: Home   Recommendations for Outpatient Follow-up:  1. Follow up with PCP in 1-2 weeks 2. Follow up with nephrology as scheduled. 3. Recommend palliative care services 4. Tuberculin skin test administered intradermal right arm 06/30/2016 at ~13:00, right arm marked at site. Will need reviewing/to be read in 48 - 72 hrs.  Home Health: N/A Equipment/Devices: Youth rolling walker recommended Discharge Condition: Stable CODE STATUS: DNR Diet recommendation: Renal  Brief/Interim Summary: 56 year old WF PMHx CVA, DM Type 2 uncontrolled w/ complication, HLD, HTN,   Admitted on 12/20 after having several days of nausea vomiting, diarrhea and weakness. Patient found to have acute kidney injury and sepsis secondary to Escherichia coli UTI causing pyelonephritis. Sepsis stabilized and patient's in about a change to by mouth, however with persistent fever and high white count, we started IV antibiotics and placed on meropenem. CT scan of abdomen and pelvis ruled out any kind of abscess and only showed nonobstructive stones. Urology consulted for possible obstructive uropathy, but at this time, they recommended no urology intervention. Was put on IV Lasix for final overload and then started developing worsening renal failure so nephrology consulted. Lasix stopped and gentle IV fluids started.   Foley catheter removed on 12/31. Patient without fever since then. Started developing hyperkalemia so given Kayexalate on 1/1. She was complaining of some bilateral foot tingling so her home Lyrica was restarted. Her white count is slowly improving although her renal function has failed to improve significantly. She has outpatient nephrology follow up.   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 coli bacteremia from urinary source:Resolved. Some persistent bilateral renal stranding on CT 1/4 with negative repeat blood and urine cultures 1/6. No longer on therapy. - Repeat urine culture NGTD:patient has been on 2 weeks of antibiotics (from 12/21 to 1/2) with zosyn, rocephin then Meropenem. Stable off abx since.  -She had a foley catheter for 8 days which was discontinued on 12/31.Reinserted on 1/5 for 24hrs urine collection. (baseline incontinence)  Acute kidney injury: Suspected due to severe bilateral pyelonephritis, though no improvement with treatment. Perhaps not AKI but new Dx CKD. - Baseline creatinine 2-3 during hospitalization, will monitor as outpatient for improvement.  - Follow up is scheduled with nephrology, [facility] - Follow up SPEP/UPEP - Continue bicarb for acidosis. - Counseled to avoid NSAIDs.  - Plan to hold ACE on discharge until follow up.  Acute colitis: Seen on CT abd 12/20 with abdominal pain. Suspected infectious, resolved on subsequent imaging s/p abx. No symptoms.   K7QQ with complications: Last VZD6L 10.9% in Nov 2017.  - Holding metformin, glipizide - will discontinue at discharge due to renal impairment.  -  Continue resistant SSI + 70/30 20u BIDAC.  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 - Sister reports that pt's ophthalmologist, Dr Dwana Melena at [facility], is attempting to get pt disability.  Anemia: Due to iron deficiency and chronic renal disease. Baseline hgb 11-13. 7.6 on admission. Stool FOBT negative x2. Vitamin B12, folate wnl. Ferritin 1,102, iron 24, TIBC 255, %sat 9.  - s/p 2u PRBCs 12/22. Stable since at 9-10.  - Continue to monitor as outpatient.  Chronic diastolic CHF: Appears euvolemic. - Strict in and out - Daily weight - Metoprolol 50 mg BID  Essential hypertension:  - See CHF   Hx CVA Residual impaired memory, seen by Dr [name] in the past, not sure if patient is compliant with meds or medical follow up, per sister it is very hard for her to go to see a doctor, until she lost her vision. - Ordered ASA and plavix, but again ?compliance.  FTT and poor social situation: Generalized weakness, very frail, impaired vision, poor cognitive status. Was staying at [place] [facility] but is a significant burden given her high level of ADL dependency/incontinence to that family who already has a developmentally delayed child and limited financial resources.  - PO intake improving - Nutrition consult - CSW, CM consulted  Goals of care - Discussed case with palliative care due to heavy symptom burden, who recommended outpatient palliative care services.   Discharge Instructions Discharge Instructions    Discharge instructions    Complete by:  As directed    You were admitted for kidney impairment which has remained stable. It is important that you follow these recommendations after discharge:  - Follow up with nephrology (kidney doctor) - Start taking calcium tablets twice daily and sodium bicarbonate twice daily - NEVER take NSAIDs (ibuprofen, advil, goody's powder, naproxen, aleve) - Take tylenol for pain as needed instead  Regarding your diabetes, due to worsening kidney function, you should:  - STOP  taking metformin - STOP taking glipizide - START taking insulin 70/30 mix twice daily with meals. Start taking 20 units twice daily and follow up with your primary care doctor. Supplies have been sent to your pharmacy.   If your symptoms return please seek medical attention right away.   Discharge patient    Complete by:  As directed    Discharge disposition:  03-Skilled [place]   Discharge patient date:  07/01/2016     Allergies as of 07/01/2016      Reactions   Hydrocodone Nausea And Vomiting      Medication List    STOP taking these medications   glipiZIDE 5 MG tablet Commonly known as:  GLUCOTROL   ibuprofen 200 MG tablet Commonly known as:  ADVIL,MOTRIN   lisinopril 5 MG tablet Commonly known as:  PRINIVIL,ZESTRIL   metFORMIN 500 MG 24 hr tablet Commonly known as:  GLUCOPHAGE XR     TAKE these medications   aspirin 81 MG tablet Take 1 tablet (81 mg total) by mouth daily.   atorvastatin 20 MG tablet Commonly known as:  LIPITOR Take 1 tablet (20 mg total) by mouth daily.   calcium carbonate 500 MG chewable tablet Commonly known as:  TUMS - dosed in mg elemental calcium Chew 2 tablets (400 mg of elemental calcium total) by mouth 2 (two) times daily with a meal.   glucose blood test strip Commonly known as:  TRUE METRIX BLOOD GLUCOSE TEST Use as instructed   insulin aspart protamine- aspart (70-30) 100 UNIT/ML injection  Commonly known as:  NOVOLOG MIX 70/30 Inject 0.2 mLs (20 Units total) into the skin 2 (two) times daily with a meal.   Insulin Pen Needle 31G X 5 MM Misc Use as directed   INSULIN SYRINGE .3CC/31GX5/16" 31G X 5/16" 0.3 ML Misc 20 Units by Does not apply route 2 (two) times daily.   metoprolol 50 MG tablet Commonly known as:  LOPRESSOR Take 1 tablet (50 mg total) by mouth 2 (two) times daily. What changed:  medication strength  how much to take   multivitamin tablet Take 1 tablet by mouth daily.   sodium bicarbonate 650 MG  tablet Take 1 tablet (650 mg total) by mouth 2 (two) times daily.   TRUE METRIX METER w/Device Kit Use as directed   TRUEPLUS LANCETS 28G Misc Use as directed            Durable Medical Equipment        Start     Ordered   06/26/16 1109  For home use only DME Walker rolling  Once    Comments:  Youth RW please.  Question:  Patient needs a walker to treat with the following condition  Answer:  Balance problem   06/26/16 1109      Contact information for follow-up providers    Arnoldo Morale, MD Follow up in 1 week(s).   Specialty:  Family Medicine Contact information: [address] [address] [phone]        Estanislado Emms, MD Follow up on 07/16/2016.   Specialty:  Nephrology Why:  Follow up aptt with kidney doctor , Dr Florene Glen, Wed Jan 24th, please arrive at 10:15 Contact information: [place] [address] [phone]            Contact information for after-discharge care    Destination    [facility] Follow up.   Specialty:  [place] information: [address] [id number] [phone]                 Allergies  Allergen Reactions  . Hydrocodone Nausea And Vomiting    Consultations:  Nephrology, Dr. Jonnie Finner  Procedures/Studies: Ct Abdomen Pelvis Wo Contrast  Result Date: 06/26/2016 CLINICAL DATA:  History of diabetes hypertension CVA, pyelonephritis and sepsis EXAM: CT CHEST, ABDOMEN AND PELVIS WITHOUT CONTRAST TECHNIQUE: Multidetector CT imaging of the chest, abdomen and pelvis was performed following the standard protocol without IV contrast. COMPARISON:  06/17/2016, 06/14/2016 FINDINGS: CT CHEST FINDINGS Cardiovascular: Limited evaluation without intravenous contrast. Mild atherosclerotic calcifications. No aneurysmal dilatation. Mild coronary artery calcifications. Normal heart size. No large pericardial  effusion. Mediastinum/Nodes: Thyroid gland within normal limits. Trachea is midline. No significantly enlarged mediastinal lymph nodes. Limited evaluation for hilar nodes without contrast. Esophagus is unremarkable. Lungs/Pleura: Lungs are clear. No pleural effusion or pneumothorax. Musculoskeletal: No chest wall mass or suspicious bone lesions identified. CT ABDOMEN PELVIS FINDINGS Hepatobiliary: No focal hepatic abnormality. Gallbladder is contracted. Small stone is not as well appreciated on the current exam. No wall thickening. No biliary dilatation. Pancreas: Unremarkable. No pancreatic ductal dilatation or surrounding inflammatory changes. Small calcifications near the ends an aunt process of the pancreas as before. Spleen: Normal in size without focal abnormality. Adrenals/Urinary Tract: Adrenal glands are within normal limits. Bilateral nephromegaly. Moderate perinephric fat stranding  as before. No significant hydronephrosis. Multiple punctate intrarenal calculi. No definitive ureteral stones. Bladder has a small amount of air anteriorly. Bladder is dilated. Stomach/Bowel: Moderate stool in the colon. No wall thickening. Normal appendix. No dilated small bowel. Vascular/Lymphatic: Aortic atherosclerosis. No enlarged abdominal or pelvic lymph nodes. Reproductive: Uterus and bilateral adnexa are unremarkable. Other: Small amount of free fluid in the upper abdomen. No free air. Musculoskeletal: No acute or significant osseous findings. IMPRESSION: 1. No acute infiltrate or effusion in the chest. 2. Edematous enlarged kidneys suggests ongoing inflammation or infection. Persistent moderate perinephric fat stranding. No gross hydronephrosis or hydroureter. Multiple punctate intrarenal calculi again visualized. 3. Small calcified gallstone is difficult to appreciate on current exam. The gallbladder is contracted. Electronically Signed   By: Donavan Foil M.D.   On: 06/26/2016 20:45   Ct Abdomen Pelvis Wo  Contrast  Result Date: 06/17/2016 CLINICAL DATA:  Diarrhea with nausea and vomiting.  Weakness. EXAM: CT ABDOMEN AND PELVIS WITHOUT CONTRAST TECHNIQUE: Multidetector CT imaging of the abdomen and pelvis was performed following the standard protocol without IV contrast. COMPARISON:  06/11/2016. FINDINGS: Lower chest: Bibasilar atelectasis with tiny bilateral pleural effusions. Hepatobiliary: No focal abnormality in the liver on this study without intravenous contrast. Gallbladder is decompressed with tiny calcified gallstone noted. No intrahepatic or extrahepatic biliary dilation. Pancreas: No focal mass lesion. No dilatation of the main duct. No intraparenchymal cyst. No peripancreatic edema. Spleen: No splenomegaly. No focal mass lesion. Adrenals/Urinary Tract: No adrenal nodule or mass. Both kidneys remain within enlarged appearance and ill-defined margins suggesting edema/inflammation. Tiny nonobstructing stones are noted bilaterally. No evidence for hydroureteronephrosis. Foley catheter decompresses the urinary bladder. Stomach/Bowel: Stomach is nondistended. No gastric wall thickening. No evidence of outlet obstruction. Duodenum is normally positioned as is the ligament of Treitz. No small bowel wall thickening. No small bowel dilatation. The terminal ileum is normal. The appendix is not visualized, but there is no edema or inflammation in the region of the cecum. No gross colonic mass. No colonic wall thickening. No substantial diverticular change. Vascular/Lymphatic: There is abdominal aortic atherosclerosis without aneurysm. There is no gastrohepatic or hepatoduodenal ligament lymphadenopathy. No intraperitoneal or retroperitoneal lymphadenopathy. Dystrophic calcifications seen anterior to the aorta and posterior to the SMV may be related to focal dystrophic calcification in the uncinate process of the pancreas although calcified lymph node in the root of the small bowel mesentery would also be a  consideration. Reproductive: The uterus has normal CT imaging appearance. There is no adnexal mass. Other: No intraperitoneal free fluid. Musculoskeletal: Bone windows reveal no worrisome lytic or sclerotic osseous lesions. IMPRESSION: 1. Scattered areas of wall thickening seen in small bowel and colon on the previous study resolved in the interval and may well within related to underdistention previously. 2. Both kidneys remain enlarged and edematous with perinephric edema/inflammation. No hydroureteronephrosis. Tiny bilateral nonobstructing stones are again noted. 3. Dystrophic calcification identified in the region of the uncinate process/mesenteric root. This may be related to focal parenchymal calcification in the pancreas or dystrophic calcification within central abdominal lymph nodes. Follow-up evaluation with intravenous contrast could be used when renal function normalizes. 4. New bibasilar atelectasis with tiny bilateral pleural effusions. 5.  Abdominal Aortic Atherosclerois (ICD10-170.0) Electronically Signed   By: Misty Stanley M.D.   On: 06/17/2016 15:36   Ct Abdomen Pelvis Wo Contrast  Result Date: 06/11/2016 CLINICAL DATA:  Fall in the shower, week with diarrhea and nausea and vomiting EXAM: CT ABDOMEN AND PELVIS WITHOUT CONTRAST TECHNIQUE: Multidetector  CT imaging of the abdomen and pelvis was performed following the standard protocol without IV contrast. COMPARISON:  None. FINDINGS: Lower chest: Lung bases demonstrate mild dependent atelectasis posteriorly. Minimal bronchiectasis posterior right lower lobe. No pleural effusion. Heart size nonenlarged. Hepatobiliary: No focal liver abnormality is seen. No gallstones, gallbladder wall thickening, or biliary dilatation. Pancreas: No pancreatic ductal dilatation or surrounding inflammatory changes. Few scattered calcifications at the head and uncinate process of the pancreas suggestive of chronic pancreatitis. Spleen: Normal in size without focal  abnormality. Adrenals/Urinary Tract: Adrenal glands are within normal limits. Kidneys appear edematous and there is moderate perinephric fat stranding. There are multiple punctate nonobstructing stones within the bilateral kidneys. No ureteral stone. Bladder is normal. Stomach/Bowel: The stomach is nonenlarged. Multifocal areas of mild colon wall thickening throughout the colon. Majority of the contrast is in the colon. Fluid filled loops of nondilated small bowel with suggestion of possible wall thickening. Normal appendix. Vascular/Lymphatic: Aortic atherosclerosis. No enlarged abdominal or pelvic lymph nodes. Reproductive: Uterus and bilateral adnexa are unremarkable. Other: No free air. Musculoskeletal: No acute or significant osseous findings. IMPRESSION: 1. Kidneys appear an edematous bilaterally and there is moderate perinephric fat stranding, findings are nonspecific but could be due to medical renal disease or possible pyelonephritis, clinical correlation recommended. Multiple punctate stones within the bilateral kidneys without ureteral stone identified. 2. Multifocal areas of mild wall thickening involving the colon. There are fluid-filled loops of small bowel with suggested areas of mild wall thickening, findings could relate to enteritis/colitis of infectious or inflammatory etiology. There is no evidence for a bowel obstruction. The appendix is normal. Electronically Signed   By: Donavan Foil M.D.   On: 06/11/2016 19:31   Dg Chest 1 View  Result Date: 06/14/2016 CLINICAL DATA:  Shortness of breath. EXAM: CHEST 1 VIEW COMPARISON:  06/12/2016 FINDINGS: Hazy densities at the lung bases, particularly on the right side. Difficult to exclude effusions. Upper lungs remain clear. Heart size is normal. The trachea is midline. Negative for a pneumothorax. IMPRESSION: Subtle bibasilar chest densities. Findings could represent atelectasis. Difficult to exclude tiny effusions. Electronically Signed   By: Markus Daft M.D.   On: 06/14/2016 13:56   Ct Chest Wo Contrast  Result Date: 06/26/2016 CLINICAL DATA:  History of diabetes hypertension CVA, pyelonephritis and sepsis EXAM: CT CHEST, ABDOMEN AND PELVIS WITHOUT CONTRAST TECHNIQUE: Multidetector CT imaging of the chest, abdomen and pelvis was performed following the standard protocol without IV contrast. COMPARISON:  06/17/2016, 06/14/2016 FINDINGS: CT CHEST FINDINGS Cardiovascular: Limited evaluation without intravenous contrast. Mild atherosclerotic calcifications. No aneurysmal dilatation. Mild coronary artery calcifications. Normal heart size. No large pericardial effusion. Mediastinum/Nodes: Thyroid gland within normal limits. Trachea is midline. No significantly enlarged mediastinal lymph nodes. Limited evaluation for hilar nodes without contrast. Esophagus is unremarkable. Lungs/Pleura: Lungs are clear. No pleural effusion or pneumothorax. Musculoskeletal: No chest wall mass or suspicious bone lesions identified. CT ABDOMEN PELVIS FINDINGS Hepatobiliary: No focal hepatic abnormality. Gallbladder is contracted. Small stone is not as well appreciated on the current exam. No wall thickening. No biliary dilatation. Pancreas: Unremarkable. No pancreatic ductal dilatation or surrounding inflammatory changes. Small calcifications near the ends an aunt process of the pancreas as before. Spleen: Normal in size without focal abnormality. Adrenals/Urinary Tract: Adrenal glands are within normal limits. Bilateral nephromegaly. Moderate perinephric fat stranding as before. No significant hydronephrosis. Multiple punctate intrarenal calculi. No definitive ureteral stones. Bladder has a small amount of air anteriorly. Bladder is dilated. Stomach/Bowel: Moderate stool in the  colon. No wall thickening. Normal appendix. No dilated small bowel. Vascular/Lymphatic: Aortic atherosclerosis. No enlarged abdominal or pelvic lymph nodes. Reproductive: Uterus and bilateral adnexa are  unremarkable. Other: Small amount of free fluid in the upper abdomen. No free air. Musculoskeletal: No acute or significant osseous findings. IMPRESSION: 1. No acute infiltrate or effusion in the chest. 2. Edematous enlarged kidneys suggests ongoing inflammation or infection. Persistent moderate perinephric fat stranding. No gross hydronephrosis or hydroureter. Multiple punctate intrarenal calculi again visualized. 3. Small calcified gallstone is difficult to appreciate on current exam. The gallbladder is contracted. Electronically Signed   By: Donavan Foil M.D.   On: 06/26/2016 20:45   US Renal  Result Date: 06/26/2016 CLINICAL DATA:  Elevated creatinine.  Abnormal CT. EXAM: RENAL / URINARY TRACT ULTRASOUND COMPLETE COMPARISON:  CT 06/17/2016. FINDINGS: Right Kidney: Length: 12.5 cm. Echogenicity within normal limits. No mass or hydronephrosis visualized. Left Kidney: Length: 12.1 cm. Echogenicity within normal limits. No mass or hydronephrosis visualized. Bladder: Appears normal for degree of bladder distention. IMPRESSION: No focal renal abnormality identified. No evidence of renal or perirenal abscess. No evidence of hydronephrosis . Electronically Signed   By: Marcello Moores  Register   On: 06/26/2016 12:34   Dg Chest Port 1 View  Result Date: 06/12/2016 CLINICAL DATA:  Fever and chills sepsis EXAM: PORTABLE CHEST 1 VIEW COMPARISON:  01/28/2016 FINDINGS: Slightly elevated right diaphragm. No acute consolidation or pleural effusion. Minimal linear peripheral opacities on the right could relate to linear atelectasis. Heart size upper normal. Pulmonary vascularity within normal limits. No pneumothorax. IMPRESSION: 1. No acute consolidation or effusion 2. Minimal linear peripheral opacities on the right could relate to atelectasis. Electronically Signed   By: Donavan Foil M.D.   On: 06/12/2016 22:10   Dg Abd Portable 1v  Result Date: 06/12/2016 CLINICAL DATA:  56 year old female with abdominal pain, colitis,  and abdominal distention. EXAM: PORTABLE ABDOMEN - 1 VIEW COMPARISON:  Abdominal CT dated 06/11/2016 FINDINGS: Air is noted within the stomach and throughout the colon. Oral contrast from recent CT is seen in the distal colon and rectosigmoid. There is no bowel dilatation or evidence of obstruction. No free air or radiopaque calculi noted. The osseous structures and the soft tissues are grossly unremarkable. Right lung base linear densities likely atelectasis/scarring. IMPRESSION: No bowel obstruction or free air. Electronically Signed   By: Anner Crete M.D.   On: 06/12/2016 23:35   12/20 CT abdomen pelvis W contrast:- Kidneys appear an edematous bilaterally and there is moderate perinephric fat stranding, - Multiple punctate stones within bilateral kidneys without ureteral stone identified. -Multifocal areas of mild wall thickening involving the colon. There are fluid-filled loops of small bowel with suggested areas of mild wall thickening, findings could relate to enteritis/colitis of infectious or inflammatory etiology. 06/13/16 transfuse 2 units PRBC 12/26 CT abdomen pelvis W contrast:-Scattered areas of wall thickening seen in small bowel and colon on the previous study resolved in the interval and may well within related to underdistention previously. - Both kidneys remain enlarged and edematous with perinephric edema/inflammation. No hydroureteronephrosis.  -New bibasilar atelectasis with tiny bilateral pleural effusions. 06/17/16 Echocardiogram: LVEF= 65% to 70%. - (grade 2 diastolicdysfunction). 1/4 CT chest W contrast:-Edematous enlarged kidneys suggests ongoing inflammation or infection. Persistent moderate perinephric fat stranding. No gross hydronephrosis or hydroureter  Cultures 12/20 urine positive Escherichia coli 12/20 MRSA by PCR negative 12/20 blood positive Escherichia coli 12/21 blood positive Escherichia coli 12/29 blood  12/31 urine negative 1/5 blood NGTD 1/5  urine negative  Subjective: Pt doing well, no new complaints.   Discharge Exam: Vitals:   07/01/16 0637 07/01/16 1413  BP: (!) 134/59 (!) 127/53  Pulse: 88 87  Resp: 18 16  Temp: 98.1 F (36.7 C) 99.9 F (37.7 C)   Vitals:   06/30/16 2142 07/01/16 0500 07/01/16 0637 07/01/16 1413  BP: (!) 153/74  (!) 134/59 (!) 127/53  Pulse: (!) 104  88 87  Resp: 18  18 16   Temp: 100.3 F (37.9 C)  98.1 F (36.7 C) 99.9 F (37.7 C)  TempSrc: Oral  Oral Oral  SpO2: 93%  94% 96%  Weight:  38.3 kg (84 lb 7 oz)    Height:       General: Frail, small 56yo female in no distress appearing older than stated age. Eyes: negative scleral hemorrhage, negative anisocoria, negative icterus ENT: Negative Runny nose, negative gingival bleeding, poor dentition. Neck:  Negative scars, masses, torticollis, lymphadenopathy, JVD Lungs: Clear to auscultation bilaterally without wheezes or crackles Cardiovascular: Regular rate and rhythm without murmur gallop or rub normal S1 and S2 Abdomen: Soft, NT, ND, + bowel sounds, no rebound, no ascites, no appreciable mass Extremities: No significant cyanosis, clubbing, or edema bilateral lower extremities Skin: Negative rashes, lesions, ulcers Psychiatric:  Negative depression, negative anxiety, negative fatigue, negative mania  Central nervous system:  Cranial nerves II through XII intact. Diffusely weak.  The results of significant diagnostics from this hospitalization (including imaging, microbiology, ancillary and laboratory) are listed below for reference.    Microbiology: Recent Results (from the past 240 hour(s))  Urine culture     Status: None   Collection Time: 06/22/16  9:40 AM  Result Value Ref Range Status   Specimen Description URINE, CLEAN CATCH  Final   Special Requests NONE  Final   Culture NO GROWTH Performed at [facility]   Final   Report Status 06/23/2016 FINAL  Final  Culture, blood (routine x 2)     Status: None (Preliminary  result)   Collection Time: 06/27/16  8:59 AM  Result Value Ref Range Status   Specimen Description BLOOD LEFT ANTECUBITAL  Final   Special Requests IN PEDIATRIC BOTTLE [place]  Final   Culture   Final    NO GROWTH 4 DAYS Performed at [facility]    Report Status PENDING  Incomplete  Culture, blood (routine x 2)     Status: None (Preliminary result)   Collection Time: 06/27/16  9:02 AM  Result Value Ref Range Status   Specimen Description BLOOD RIGHT HAND  Final   Special Requests BOTTLES DRAWN AEROBIC ONLY [place]  Final   Culture   Final    NO GROWTH 4 DAYS Performed at [facility]    Report Status PENDING  Incomplete  Urine culture     Status: None   Collection Time: 06/27/16  6:00 PM  Result Value Ref Range Status   Specimen Description URINE, CLEAN CATCH  Final   Special Requests NONE  Final   Culture NO GROWTH Performed at [facility]   Final   Report Status 06/29/2016 FINAL  Final     Labs: Basic Metabolic Panel:  Recent Labs Lab 06/25/16 0529 06/26/16 0446 06/27/16 0527 06/28/16 0358 06/29/16 0539 06/30/16 0500  NA 135  136 139 142 137 136 137  K 5.0  5.0 5.3* 4.5 4.1 4.6 5.0  CL 103  104 108 105 105 105 101  CO2 21*  21* 19* 25 22  22 23  GLUCOSE 134*  136* 82 151* 64* 178* 144*  BUN 71*  73* 73* 67* 75* 75* 76*  CREATININE 2.03*  2.02* 2.02* 2.15* 2.24* 2.37* 2.62*  CALCIUM 8.7*  8.7* 8.7* 8.7* 8.5* 8.6* 9.5  MG  --   --   --   --   --  2.2  PHOS 4.7* 4.3 4.6 3.4  --   --    Liver Function Tests:  Recent Labs Lab 06/25/16 0529 06/26/16 0446 06/27/16 0527 06/28/16 0358  ALBUMIN 2.3* 2.4* 2.6* 2.5*   CBC:  Recent Labs Lab 06/25/16 0529 06/26/16 0446 06/27/16 0527 06/29/16 0539  WBC 18.1* 18.4* 17.3* 14.8*  NEUTROABS  --   --  14.0* 11.3*  HGB 10.5* 10.1* 9.7* 9.2*  HCT 32.3* 31.4* 30.8* 29.0*  MCV 83.7 84.9 84.2 85.3  PLT 545* 553* 576* 555*   CBG:  Recent Labs Lab 06/30/16 1226 06/30/16 1827  06/30/16 2155 07/01/16 0737 07/01/16 1130  GLUCAP 273* 111* 91 127* 233*   Anemia work up  Recent Labs  06/29/16 1404  VITAMINB12 273  FOLATE 38.0  FERRITIN 1,102*  TIBC 255  IRON 24*  RETICCTPCT 2.4   Urinalysis    Component Value Date/Time   COLORURINE STRAW (A) 06/27/2016 1800   APPEARANCEUR CLEAR 06/27/2016 1800   LABSPEC 1.005 06/27/2016 1800   PHURINE 7.0 06/27/2016 1800   GLUCOSEU NEGATIVE 06/27/2016 1800   HGBUR NEGATIVE 06/27/2016 1800   BILIRUBINUR NEGATIVE 06/27/2016 1800   BILIRUBINUR n 05/25/2012 1202   KETONESUR NEGATIVE 06/27/2016 1800   PROTEINUR NEGATIVE 06/27/2016 1800   UROBILINOGEN 0.2 05/25/2012 1202   UROBILINOGEN 0.2 03/29/2011 0131   NITRITE NEGATIVE 06/27/2016 1800   LEUKOCYTESUR MODERATE (A) 06/27/2016 1800   Microbiology Recent Results (from the past 240 hour(s))  Urine culture     Status: None   Collection Time: 06/22/16  9:40 AM  Result Value Ref Range Status   Specimen Description URINE, CLEAN CATCH  Final   Special Requests NONE  Final   Culture NO GROWTH Performed at [facility]   Final   Report Status 06/23/2016 FINAL  Final  Culture, blood (routine x 2)     Status: None (Preliminary result)   Collection Time: 06/27/16  8:59 AM  Result Value Ref Range Status   Specimen Description BLOOD LEFT ANTECUBITAL  Final   Special Requests IN PEDIATRIC BOTTLE [place]  Final   Culture   Final    NO GROWTH 4 DAYS Performed at [facility]    Report Status PENDING  Incomplete  Culture, blood (routine x 2)     Status: None (Preliminary result)   Collection Time: 06/27/16  9:02 AM  Result Value Ref Range Status   Specimen Description BLOOD RIGHT HAND  Final   Special Requests BOTTLES DRAWN AEROBIC ONLY [place]  Final   Culture   Final    NO GROWTH 4 DAYS Performed at [facility]    Report Status PENDING  Incomplete  Urine culture     Status: None   Collection Time: 06/27/16  6:00 PM  Result Value Ref Range Status    Specimen Description URINE, CLEAN CATCH  Final   Special Requests NONE  Final   Culture NO GROWTH Performed at [facility]   Final   Report Status 06/29/2016 FINAL  Final   Time coordinating discharge: Over 70 minutes  Vance Gather, MD  [facility] 07/01/2016, 4:11 PM Pager [phone]

## 2016-06-30 NOTE — Progress Notes (Addendum)
Hassell Halim [phone] and wife Deloria Lair here at [facility] states pt did not live with them and he could not take pt Riss home with them. Ronalee Belts states that no one will be there with pt.  Ronalee Belts also states that his wife [name] is not in  condition to talk to any one about her sister, because she can not handle it. Also this CM noted a handicapped young man with the Perdue's pacing and asking questions. Later Mrs. Perdue stated I have a handicapped son at home who can't take this with my sister. That is him with [name] now. Mrs. Langston Masker stated, it is too much on me.

## 2016-06-30 NOTE — Progress Notes (Addendum)
Spoke with pt's sister in [place] [phone], concerning pt's discharge and pt going home with her.  Mrs. Langston Masker states that she will have to call her husband because pt can not come stay there.  Mrs. Langston Masker states that her husband pays for everything there in the home. The pt has no money and pays nothing. Mrs. Langston Masker declined pt coming in her home.  Mrs. Langston Masker states she can not take care of her sister, that she is not a Marine scientist. Mrs. Langston Masker states that I will yell at her, it's not right that she, I am going to get a lawyer, I can't stay home, I am not an nurse and I can't do this 24 hours/7 days a week. Explained to Mrs. Perdue that a SW, HHRN, NA will come in to assist pt in the home.

## 2016-06-30 NOTE — Progress Notes (Signed)
Occupational Therapy Treatment Patient Details Name: [name] Michael MRN: [id number] DOB: 15-Dec-1960 Today's Date: 06/30/2016    History of present illness [name] Michael is a 56 y.o. female with medical history significant of diabetes mellitus, hyperlipidemia, hypertension, stroke. 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  Shared with RN  Follow Up Recommendations  Home health OT;Supervision/Assistance - 24 hour    Equipment Recommendations  3 in 1 bedside commode;Other (comment) (youth rolling walker)    Recommendations for Other Services      Precautions / Restrictions Precautions Precautions: Fall Precaution Comments: incontinence       Mobility Bed Mobility Overal bed mobility: Modified Independent             General bed mobility comments: increased time  Transfers Overall transfer level: Needs assistance Equipment used: Rolling walker (2 wheeled) Transfers: Sit to/from Stand Sit to Stand: Min guard                  ADL       Grooming: Wash/dry hands;Standing;Min guard;Wash/dry face       Lower Body Bathing: Minimal assistance;Sit to/from stand;Cueing for safety;Cueing for sequencing   Upper Body Dressing : Minimal assistance;Sitting   Lower Body Dressing: Minimal assistance;Sit to/from stand;Cueing for safety;Cueing for sequencing   Toilet Transfer: RW;Ambulation;Cueing for sequencing;Cueing for safety;Comfort height toilet;Minimal assistance   Toileting- Clothing Manipulation and Hygiene: Sit to/from stand;Cueing for sequencing;Cueing for safety;Moderate assistance         General ADL Comments: Pt walked to BR and had BM on way to bathroom-pt totally unaware of this. OT Aed pt to bathroom and Aed pt in cleaning herself  as well as changing gown and socks                Cognition   Behavior During Therapy: WFL for tasks assessed/performed Overall Cognitive Status: No family/caregiver present to determine baseline cognitive functioning                           Pertinent Vitals/ Pain       Pain Assessment: No/denies pain            Progress Toward Goals  OT Goals(current goals can now be found in the care plan section)  Progress towards OT goals: Progressing toward goals     Plan         End of Session Equipment Utilized During Treatment: Rolling walker   Activity Tolerance Patient tolerated treatment well   Patient Left in bed;with nursing/sitter in room;with call bell/phone within reach   Nurse Communication Mobility status        Time: 0962-8366 OT Time Calculation (min): 25 min  Charges: OT General Charges $OT Visit: 1 Procedure OT Treatments $Self Care/Home Management : 23-37 mins  Seferina Brokaw, Thereasa Parkin 06/30/2016, 6:03 PM

## 2016-06-30 NOTE — Progress Notes (Signed)
PROGRESS NOTE    [name] Michael  NGE:[id number] DOB: 1961-02-01 DOA: 06/11/2016 PCP: Arnoldo Morale, MD     Brief Narrative:  56 year old WF PMHx CVA, DM Type 2 uncontrolled w/ complication, HLD, HTN,   Admitted on 12/20 after having several days of nausea vomiting, diarrhea and weakness. Patient found to have acute kidney injury and sepsis secondary to Escherichia coli UTI causing pyelonephritis. Sepsis stabilized and patient's in about a change to by mouth, however with persistent fever and high white count, we started IV antibiotics and placed on meropenem. CT scan of abdomen and pelvis ruled out any kind of abscess and only showed nonobstructive stones. Urology consulted for possible obstructive uropathy, but at this time, they recommended no urology intervention. Was put on IV Lasix for final overload and then started developing worsening renal failure so nephrology consulted. Lasix stopped and gentle IV fluids started.   Foley catheter removed on 12/31. Patient without fever since then. Started developing hyperkalemia so given Kayexalate on 1/1. She was complaining of some bilateral foot tingling so her home Lyrica was restarted. Her white count is slowly improving although her renal function has failed to improve significantly.   Subjective: Patient feels well, without complaints this morning. Discussions with sister, from whose home she came, and her brother-in-law do not suggest this is a viable option for disposition and alternative discharge planning was not possible today. I do not believe a shelter would be a safe disposition.   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 coli bacteremia from urinary source: Resolved. Some persistent bilateral renal stranding on CT 1/4 with negative repeat blood and urine cultures 1/6. No longer on therapy. - Repeat Urine culture NGTD:patient has been on 2 weeks of antibiotics (from 12/21 to 1/2) with zosyn, rocephin then Meropenem. Stable off abx since.  - She had a Foley catheter for 8 days which was discontinued on 12/31.  Reinserted on 1/5 for 24hrs urine collection. (baseline incontinence)  Acute kidney injury: Suspected due to severe bilateral pyelonephritis, though no improvement with treatment. Perhaps not AKI but new Dx CKD. - Baseline creatinine 2-3 during hospitalization, will monitor as outpatient for improvement.  - Follow up is scheduled with nephrology  - Follow up SPEP/UPEP - Continue bicarb for acidosis. - Counseled to avoid NSAIDs.  - Plan to hold ACE on discharge until follow up.  Acute colitis: Seen on CT abd 12/20 with abdominal pain. Suspected infectious, reolved on subsequent imaging s/p abx. No symptoms.   M0NU with complications: Last UVO5D 10.9% in Nov 2017.  - Holding metformin, glipizide - will discontinue at discharge due to renal impairment.  - Continue resistant SSI + 70/30 20u BIDAC. Will need to be able to obtain and administer insulin prior to discharge. Family not willing/able.  Proliferative Diabetic Retinopathy/retinal detachment:  Legally blind per report. - Sister reports that pt's ophthalmologist, [facility] Dwana Melena at [facility], is attempting to get pt disability.  Anemia: Due to iron deficiency and chronic renal disease. Baseline  hgb 11-13. 7.6 on admission. Stool FOBT negative x2. Vitamin B12, folate wnl. Ferritin 1,102, iron 24, TIBC 255, %sat 9.  - s/p 2u PRBCs 12/22. Stable since at 9-10.  - Continue to monitor as outpatient.  Chronic diastolic CHF: Appears euvolemic. - Strict in and out - Daily weight - Metoprolol 50 mg BID  Essential hypertension:  - See CHF   Hx CVA Residual impaired memory, seen by Dr [name] in the past, not sure if patient is compliant with meds or medical follow up, per sister it is very hard for her to go to see a doctor, until she lost her vision. - Ordered ASA and plavix, but again ?compliance.  FTT and poor social situation: Generalized weakness, very frail, impaired vision, poor cognitive status. Was staying at [facility] but is a significant burden given her high level of ADL dependency/incontinence to that family who already has a developmentally delayed child and limited financial resources.  - PO intake improving - Nutrition consult - CSW, CM consulted  Goals of care -PALLIATIVE CARE: Consult to discuss short-term vs long-term goals of care, hospice  DVT prophylaxis: SCD Code Status: DO NOT RESUSCITATE Family Communication: Sister and brother-in-law Disposition Plan: Patient is medically stable for discharge but disposition to former residence with Sister/brother-in-law is not likely to be successful in meeting the patient's needs. I additionally doubt that discharge to a shelter would be safe for patient. I will discuss again with CSW and CM tomorrow.   Consultants:   Urology, no intervention per urology  Nephrology   Procedures/Significant Events:  12/20 CT abdomen pelvis W contrast:- Kidneys appear an edematous bilaterally and there is moderate perinephric fat stranding, - Multiple punctate stones within bilateral kidneys without ureteral stone identified. -Multifocal areas of mild wall thickening involving the colon. There are fluid-filled loops of small bowel  with suggested areas of mild wall thickening, findings could relate to enteritis/colitis of infectious or inflammatory etiology. 06/13/16 transfuse 2 units PRBC 12/26 CT abdomen pelvis W contrast:-Scattered areas of wall thickening seen in small bowel and colon on the previous study resolved in the interval and may well within related to underdistention previously. - Both kidneys remain enlarged and edematous with perinephric edema/inflammation. No hydroureteronephrosis.  -New bibasilar atelectasis with tiny bilateral pleural effusions. 06/17/16 Echocardiogram: LVEF= 65% to 70%. - (grade 2 diastolic dysfunction). 1/4 CT chest W contrast:-Edematous enlarged kidneys suggests ongoing inflammation or infection. Persistent moderate perinephric fat stranding. No gross hydronephrosis or hydroureter  Cultures 12/20 urine positive Escherichia coli 12/20 MRSA by PCR negative 12/20 blood positive Escherichia coli 12/21 blood positive Escherichia coli 12/29 blood  12/31 urine negative 1/5 blood NGTD 1/5 urine negative   Objective: Vitals:   06/29/16 1421 06/29/16 2145 06/30/16 0452 06/30/16 1300  BP: (!) 143/86 (!) 157/69 (!) 155/76 (!) 152/70  Pulse: 93 (!) 107 88 (!) 111  Resp: 18 14 14 16   Temp: 97.9 F (36.6 C) 98.7 F (37.1 C) 99 F (37.2 C) 98.8 F (37.1 C)  TempSrc: Oral Oral Oral Oral  SpO2: 96% 95% 95% 96%  Weight:   37.6 kg (83 lb)   Height:        Intake/Output Summary (Last 24 hours) at 06/30/16 1817 Last data filed at 06/30/16 1200  Gross per 24 hour  Intake  1424.17 ml  Output             3276 ml  Net         -1851.83 ml   Filed Weights   06/28/16 0504 06/29/16 0532 06/30/16 0452  Weight: 38 kg (83 lb 12.4 oz) 37.9 kg (83 lb 8.9 oz) 37.6 kg (83 lb)    Examination:  General: Frail, small 57yo female in no distress appearing older than stated age. Eyes: negative scleral hemorrhage, negative anisocoria, negative icterus ENT: Negative Runny nose, negative  gingival bleeding, poor dentition. Neck:  Negative scars, masses, torticollis, lymphadenopathy, JVD Lungs: Clear to auscultation bilaterally without wheezes or crackles Cardiovascular: Regular rate and rhythm without murmur gallop or rub normal S1 and S2 Abdomen: Soft, NT, ND, + bowel sounds, no rebound, no ascites, no appreciable mass Extremities: No significant cyanosis, clubbing, or edema bilateral lower extremities Skin: Negative rashes, lesions, ulcers Psychiatric:  Negative depression, negative anxiety, negative fatigue, negative mania  Central nervous system:  Cranial nerves II through XII intact. Diffusely weak.  Data Reviewed: Care during the described time interval was provided by me .  I have reviewed this patient's available data, including medical history, events of note, physical examination, and all test results as part of my evaluation.  I have personally reviewed and interpreted all radiology studies.  CBC:  Recent Labs Lab 06/24/16 0323 06/25/16 0529 06/26/16 0446 06/27/16 0527 06/29/16 0539  WBC 18.9* 18.1* 18.4* 17.3* 14.8*  NEUTROABS  --   --   --  14.0* 11.3*  HGB 10.7* 10.5* 10.1* 9.7* 9.2*  HCT 33.0* 32.3* 31.4* 30.8* 29.0*  MCV 83.5 83.7 84.9 84.2 85.3  PLT 498* 545* 553* 576* 628*   Basic Metabolic Panel:  Recent Labs Lab 06/24/16 0323 06/25/16 0529 06/26/16 0446 06/27/16 0527 06/28/16 0358 06/29/16 0539 06/30/16 0500  NA 134* 135  136 139 142 137 136 137  K 5.2* 5.0  5.0 5.3* 4.5 4.1 4.6 5.0  CL 101 103  104 108 105 105 105 101  CO2 22 21*  21* 19* 25 22 22 23   GLUCOSE 289* 134*  136* 82 151* 64* 178* 144*  BUN 67* 71*  73* 73* 67* 75* 75* 76*  CREATININE 2.21* 2.03*  2.02* 2.02* 2.15* 2.24* 2.37* 2.62*  CALCIUM 8.5* 8.7*  8.7* 8.7* 8.7* 8.5* 8.6* 9.5  MG  --   --   --   --   --   --  2.2  PHOS 5.7* 4.7* 4.3 4.6 3.4  --   --    GFR: Estimated Creatinine Clearance: 12.6 mL/min (by C-G formula based on SCr of 2.62 mg/dL  (H)). Liver Function Tests:  Recent Labs Lab 06/24/16 0323 06/25/16 0529 06/26/16 0446 06/27/16 0527 06/28/16 0358  ALBUMIN 2.3* 2.3* 2.4* 2.6* 2.5*   No results for input(s): LIPASE, AMYLASE in the last 168 hours. No results for input(s): AMMONIA in the last 168 hours. Coagulation Profile: No results for input(s): INR, PROTIME in the last 168 hours. Cardiac Enzymes: No results for input(s): CKTOTAL, CKMB, CKMBINDEX, TROPONINI in the last 168 hours. BNP (last 3 results) No results for input(s): PROBNP in the last 8760 hours. HbA1C: No results for input(s): HGBA1C in the last 72 hours. CBG:  Recent Labs Lab 06/29/16 2143 06/30/16 0016 06/30/16 0448 06/30/16 0822 06/30/16 1226  GLUCAP 260* 166* 169* 138* 273*   Lipid Profile: No results for input(s): CHOL, HDL, LDLCALC, TRIG, CHOLHDL, LDLDIRECT in the last 72 hours. Thyroid Function Tests:  No results for input(s): TSH, T4TOTAL, FREET4, T3FREE, THYROIDAB in the last 72 hours. Anemia Panel:  Recent Labs  06/29/16 1404  VITAMINB12 273  FOLATE 38.0  FERRITIN 1,102*  TIBC 255  IRON 24*  RETICCTPCT 2.4   Sepsis Labs:  Recent Labs Lab 06/27/16 0859 06/29/16 0539  PROCALCITON 0.42 0.42    Recent Results (from the past 240 hour(s))  Urine culture     Status: None   Collection Time: 06/22/16  9:40 AM  Result Value Ref Range Status   Specimen Description URINE, CLEAN CATCH  Final   Special Requests NONE  Final   Culture NO GROWTH Performed at [facility]   Final   Report Status 06/23/2016 FINAL  Final  Culture, blood (routine x 2)     Status: None (Preliminary result)   Collection Time: 06/27/16  8:59 AM  Result Value Ref Range Status   Specimen Description BLOOD LEFT ANTECUBITAL  Final   Special Requests IN PEDIATRIC BOTTLE [place]  Final   Culture   Final    NO GROWTH 3 DAYS Performed at [facility]    Report Status PENDING  Incomplete  Culture, blood (routine x 2)     Status: None  (Preliminary result)   Collection Time: 06/27/16  9:02 AM  Result Value Ref Range Status   Specimen Description BLOOD RIGHT HAND  Final   Special Requests BOTTLES DRAWN AEROBIC ONLY [place]  Final   Culture   Final    NO GROWTH 3 DAYS Performed at [facility]    Report Status PENDING  Incomplete  Urine culture     Status: None   Collection Time: 06/27/16  6:00 PM  Result Value Ref Range Status   Specimen Description URINE, CLEAN CATCH  Final   Special Requests NONE  Final   Culture NO GROWTH Performed at [facility]   Final   Report Status 06/29/2016 FINAL  Final         Radiology Studies: No results found.      Scheduled Meds: . atorvastatin  20 mg Oral Daily  . calcium carbonate  2 tablet Oral BID WC  . feeding supplement (NEPRO CARB STEADY)  237 mL Oral BID BM  . insulin aspart  0-15 Units Subcutaneous TID WC  . insulin aspart  0-20 Units Subcutaneous Q4H  . insulin aspart  0-5 Units Subcutaneous QHS  . insulin aspart protamine- aspart  20 Units Subcutaneous BID WC  . metoprolol tartrate  50 mg Oral BID  . multivitamin with minerals  1 tablet Oral Daily  . polyethylene glycol  17 g Oral Daily  . pregabalin  25 mg Oral BID  . senna-docusate  1 tablet Oral BID  . sodium bicarbonate  650 mg Oral BID  . sodium chloride flush  3 mL Intravenous Q12H   Continuous Infusions:   LOS: 19 days   Time spent:35 min  Vance Gather, MD [facility] Pager [phone]  If 7PM-7AM, please contact night-coverage [email or web address] Password [facility] 06/30/2016, 6:17 PM

## 2016-06-30 NOTE — Progress Notes (Signed)
APS report made.    Raynaldo Opitz, [facility] Clinical Social Worker cell #: [phone]

## 2016-06-30 NOTE — Progress Notes (Signed)
Admit: 06/11/2016 LOS: 37  42F with AKI after E. Coli UTI and bacteremia  Subjective:  Good UOP B and U Cx from 1/5 reviewed, U Cx NGF, B Cx NGTD Eating and drinking well UA 1/5 No RBC, protein, lessened WBC/HPF   01/07 0701 - 01/08 0700 In: 1074.2 [P.O.:720; I.V.:354.2] Out: 8469 [Urine:3675; Stool:1]  Filed Weights   06/28/16 0504 06/29/16 0532 06/30/16 0452  Weight: 38 kg (83 lb 12.4 oz) 37.9 kg (83 lb 8.9 oz) 37.6 kg (83 lb)    Scheduled Meds: . atorvastatin  20 mg Oral Daily  . calcium carbonate  2 tablet Oral BID WC  . feeding supplement (NEPRO CARB STEADY)  237 mL Oral BID BM  . insulin aspart  0-15 Units Subcutaneous TID WC  . insulin aspart  0-20 Units Subcutaneous Q4H  . insulin aspart  0-5 Units Subcutaneous QHS  . insulin aspart protamine- aspart  20 Units Subcutaneous BID WC  . metoprolol tartrate  50 mg Oral BID  . multivitamin with minerals  1 tablet Oral Daily  . polyethylene glycol  17 g Oral Daily  . pregabalin  25 mg Oral BID  . senna-docusate  1 tablet Oral BID  . sodium bicarbonate  650 mg Oral BID  . sodium chloride flush  3 mL Intravenous Q12H   Continuous Infusions: . dextrose 5 % and 0.9% NaCl 50 mL/hr (06/29/16 2155)   PRN Meds:.acetaminophen **OR** acetaminophen, hydrALAZINE, iopamidol, zinc oxide  Current Labs: reviewed    Physical Exam:  Blood pressure (!) 155/76, pulse 88, temperature 99 F (37.2 C), temperature source Oral, resp. rate 14, height 4' 6.5" (1.384 m), weight 37.6 kg (83 lb), last menstrual period 03/06/2013, SpO2 95 %. NAD chronically ill appearing, pleasant RRR CTAB, diminished in bases No LEE Foley Cath in place NCAT PERl No rashes/lesions  A/Rec 1. AKI, nonoliguric - from severe bilat pyelo. Creat stuck in 2-3 range for last 2.5 weeks w no improvement or worsening. This may be new baseline. May improve slowly overtime or may not.  Have scheduled f/u appt in our office, see dc info for details. Will dc IVF"s, cont  NaHCO3 for now.  Avoid all nsaids. Will sign off.   2. E. Coli UTI w bacteremia, cultures cleared; some persistent stranding of kidneys on 1/4 CT 3. Hyperkalemia/ acidosis- improved on NaHCO3 4. DM2 5. Anemia 6. HTN 7. Hx of CVA 8. FTT/Debil  Kelly Splinter MD [facility] pgr [phone]   06/30/2016, 11:09 AM     Recent Labs Lab 06/26/16 0446 06/27/16 0527 06/28/16 0358 06/29/16 0539 06/30/16 0500  NA 139 142 137 136 137  K 5.3* 4.5 4.1 4.6 5.0  CL 108 105 105 105 101  CO2 19* 25 22 22 23   GLUCOSE 82 151* 64* 178* 144*  BUN 73* 67* 75* 75* 76*  CREATININE 2.02* 2.15* 2.24* 2.37* 2.62*  CALCIUM 8.7* 8.7* 8.5* 8.6* 9.5  PHOS 4.3 4.6 3.4  --   --     Recent Labs Lab 06/26/16 0446 06/27/16 0527 06/29/16 0539  WBC 18.4* 17.3* 14.8*  NEUTROABS  --  14.0* 11.3*  HGB 10.1* 9.7* 9.2*  HCT 31.4* 30.8* 29.0*  MCV 84.9 84.2 85.3  PLT 553* 576* 555*

## 2016-06-30 NOTE — Progress Notes (Signed)
Physical Therapy Treatment Patient Details Name: [name] Michael MRN: [id number] DOB: Jan 14, 1961 Today's Date: 2016/07/21    History of Present Illness [name] Michael is a 56 y.o. female with medical history significant of diabetes mellitus, hyperlipidemia, hypertension, stroke. 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  Will likely check back for one more visit however pt close to meeting acute goals.  Follow Up Recommendations  Home health PT;Supervision for mobility/OOB     Equipment Recommendations  Rolling walker with 5" wheels (youth)    Recommendations for Other Services       Precautions / Restrictions Precautions Precautions: Fall    Mobility  Bed Mobility Overal bed mobility: Modified Independent                Transfers Overall transfer level: Needs assistance Equipment used: Rolling walker (2 wheeled) Transfers: Sit to/from Stand Sit to Stand: Min guard            Ambulation/Gait Ambulation/Gait assistance: Min guard Ambulation Distance (Feet): 300 Feet Assistive device: Rolling walker (2 wheeled) Gait Pattern/deviations: Step-through pattern;Decreased stride length     General Gait Details: verbal cues for position from RW, posture, no LE buckling    Stairs            Wheelchair Mobility    Modified Rankin (Stroke Patients Only)       Balance                                    Cognition Arousal/Alertness: Awake/alert Behavior During Therapy: WFL for tasks assessed/performed Overall Cognitive Status: No family/caregiver present to determine baseline cognitive functioning                      Exercises      General Comments        Pertinent Vitals/Pain Pain Assessment: No/denies pain     Home Living                      Prior Function            PT Goals (current goals can now be found in the care plan section) Progress towards PT goals: Progressing toward goals    Frequency    Min 3X/week      PT Plan Current plan remains appropriate    Co-evaluation             End of Session Equipment Utilized During Treatment: Gait belt Activity Tolerance: Patient tolerated treatment well Patient left: in bed;with call bell/phone within reach     Time: 1330-1344 PT Time Calculation (min) (ACUTE ONLY): 14 min  Charges:  $Gait Training: 8-22 mins                    G Codes:      Letisha Yera,KATHrine E 07-21-2016, 2:17 PM Carmelia Bake, PT, DPT 07-21-16 Pager: [phone]

## 2016-06-30 NOTE — Progress Notes (Signed)
APS report made. CSW will follow up in morning.   Kingsley Spittle, LCSWA Clinical Social Worker [phone]

## 2016-06-30 NOTE — Progress Notes (Addendum)
CSW spoke with patient, patients sister, sisters husband, and sisters son regarding discharge. Sister and husband both state they are unable to take patient home at this time. Sisters husband states "our house is not her home and we are not responsible for her. She can not take care of herself and neither can we". Patients sister states "her room smells like a nursing home and he cant take it". CSW inquired if there were other family members or friends that were able to take care of patient, sister replied "no, nobody wants to deal with this". CSW informed family that an APS report would be made if patient was not able to return with family. Sisters husband continued to state "it is not her home. She was staying with [name]. We are not her caregivers and we will not take care of her". After CSW spoke with family, sisters husband was agreeable for patient to return VIA PTAR however stated "im sure she will be back in the morning". Sisters husband stated "I will be getting a lawyer to protect our rights" regarding patient returnig home with them. Sisters husband and son left the room.   CSW continued to speak with patients sister who stated "I cant help her with insulin. Im not a nurse". Patient sister stated "I cant get her to her doctors appointments". CSW provided information regarding Medicaid and Medicaid transportation. Patients sister left at this time due to RN feeling uncomfortable with discharge.   CSW spoke with patient after all family members left. Patient stated "they have only been acting this way towards me for 3 weeks". Patient stated she feels uncomfortable returning home with family at this time. CSW will continue to update.  Kingsley Spittle, LCSWA Clinical Social Worker [phone]

## 2016-06-30 NOTE — Progress Notes (Signed)
Patient was to be discharged back home with her sister and her sister's husband. When the sister and her husband arrived to get the patient, they were very upset about the patient coming back to the home, especially the husband. With CSW in the room, the situation was discussed. After hearing the living conditions, the husband's adamant refusal to be the caregivers and the sister's concerns about not being able to be with the patient 24/7, I do not feel comfortable sending the patient back to this situation. I feel certain that if the patient were to go back to this home, the husband would have EMS called before morning. He felt very strongly that the patient would no longer be welcome in the home. The sister is unable to help with any medical expenses due to her own family's needs (her son is mentally disabled). I do not feel that the patient would be safe without 24 hour care and I do not feel that the patient has the capacity to administer her own medications and manage her diabetes. MD made aware and agreed to plan.  Callie Fielding RN

## 2016-06-30 NOTE — Progress Notes (Signed)
NUTRITION NOTE  Brief note for LOS written on 06/24/16. Pt has been eating 50-100% of meals since that time as well as consuming Nepro Shake, which is currently ordered BID. Weight has been stable throughout admission.     Jarome Matin, MS, RD, LDN, [facility] Inpatient Clinical Dietitian Pager # [phone] After hours/weekend pager # [phone]

## 2016-07-01 LAB — UIFE/LIGHT CHAINS/TP QN, 24-HR UR
% BETA, URINE: 0 %
ALPHA 1 URINE: 0 %
ALPHA 2 UR: 0 %
Albumin, U: 100 %
FREE LAMBDA LT CHAINS, UR: 12.5 mg/L — AB (ref 0.24–6.66)
FREE LT CHN EXCR RATE: 65.3 mg/L — AB (ref 1.35–24.19)
Free Kappa/Lambda Ratio: 5.22 (ref 2.04–10.37)
GAMMA GLOBULIN URINE: 0 %
PDF-U24IEL: 0
TIME-UPE24: 24 h
TOTAL PROTEIN, URINE-UPE24: 8.2 mg/dL
TOTAL PROTEIN, URINE-UR/DAY: 328 mg/(24.h) — AB (ref 30–150)
Total Volume: 4000

## 2016-07-01 LAB — GLUCOSE, CAPILLARY
GLUCOSE-CAPILLARY: 233 mg/dL — AB (ref 65–99)
GLUCOSE-CAPILLARY: 212 mg/dL — AB (ref 65–99)
Glucose-Capillary: 127 mg/dL — ABNORMAL HIGH (ref 65–99)

## 2016-07-01 LAB — LEUKOCYTE ALKALINE PHOSPHATASE: Leukocyte Alkaline  Phos Stain: 129 (ref 25–130)

## 2016-07-01 MED ORDER — TUBERCULIN PPD 5 UNIT/0.1ML ID SOLN
5.0000 [IU] | Freq: Once | INTRADERMAL | Status: DC
  Administered 2016-07-01: 5 [IU] via INTRADERMAL
  Filled 2016-07-01: qty 0.1

## 2016-07-01 NOTE — Progress Notes (Signed)
Palliative Care  Consult received for [name] Michael to "discuss short-term vs long-term goals of care, hospice." In chart review I noted she was ready for discharge with a good follow-up plan, however disposition location remains an issue. Social work and Conservation officer, historic buildings working on this. In terms of her Palliative needs, there are no overt uncontrolled symptoms noted in documentation, she has a DNR code status, and I do not believe she would qualify for Hospice services at this point. Given her overall frailty and potential for decline, I would recommend outpatient palliative services (either at a facility or home). If acute issues present--uncontrolled symptoms or decline in status--I am happy to formally consult and assist in whatever way I can. I did discuss this with her primary physician, Dr. Bonner Puna.  [name] AGNP-C Palliative Care [phone]  No charge note.

## 2016-07-01 NOTE — Progress Notes (Signed)
Pt discharged and transported by EMS. Pt left with belongings and discharge education provided by day shift nurse.

## 2016-07-01 NOTE — Progress Notes (Signed)
Physical Therapy Treatment and Discharge from Acute PT Patient Details Name: [name] Michael MRN: [id number] DOB: 1961-06-04 Today's Date: 07-28-16    History of Present Illness [name] Michael is a 56 y.o. female with medical history significant of diabetes mellitus, hyperlipidemia, hypertension, stroke. 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  Pt overall supervision level at this time.  No further acute PT needs at this time and pt will be discharged from PT.  Recommend nursing staff continue ambulating pt during acute stay.    Follow Up Recommendations  Supervision for mobility/OOB;No PT follow up     Equipment Recommendations  Rolling walker with 5" wheels (youth)    Recommendations for Other Services       Precautions / Restrictions Precautions Precautions: Fall    Mobility  Bed Mobility Overal bed mobility: Modified Independent                Transfers Overall transfer level: Needs assistance Equipment used: Rolling walker (2 wheeled) Transfers: Sit to/from Stand Sit to Stand: Supervision            Ambulation/Gait Ambulation/Gait assistance: Min guard;Supervision Ambulation Distance (Feet): 400 Feet Assistive device: Rolling walker (2 wheeled) Gait Pattern/deviations: Step-through pattern;Decreased stride length     General Gait Details: verbal cues for position from RW, no LE buckling    Stairs            Wheelchair Mobility    Modified Rankin (Stroke Patients Only)       Balance                                    Cognition Arousal/Alertness: Awake/alert Behavior During Therapy: WFL for tasks assessed/performed Overall Cognitive Status: No family/caregiver present to determine baseline cognitive functioning                       Exercises      General Comments        Pertinent Vitals/Pain Pain Assessment: No/denies pain    Home Living                      Prior Function            PT Goals (current goals can now be found in the care plan section) Progress towards PT goals: Goals met/education completed, patient discharged from PT    Frequency    Min 3X/week      PT Plan Other (comment) (goals met, d/c from acute PT)    Co-evaluation             End of Session Equipment Utilized During Treatment: Gait belt Activity Tolerance: Patient tolerated treatment well Patient left: in bed;with call bell/phone within reach     Time: 1051-1105 PT Time Calculation (min) (ACUTE ONLY): 14 min  Charges:  $Gait Training: 8-22 mins                    G Codes:      [name] Michael,[name] Michael Jul 28, 2016, 3:24 PM [name] Michael, PT, DPT 07/28/16 Pager: [phone]

## 2016-07-01 NOTE — Progress Notes (Signed)
Unsafe discharge on yesterday. [place] program was given to pt and sister. Will follow up on today with discharge plan.

## 2016-07-01 NOTE — Progress Notes (Addendum)
Tuberculin skin test administered intradermal right arm, right arm marked at site. Will need reviewing/to be read in 48 hrs. SRP, RN

## 2016-07-01 NOTE — NC FL2 (Signed)
[place] LEVEL OF CARE SCREENING TOOL     IDENTIFICATION  Patient Name: [name] Michael Birthdate: [birth year] Sex: female Admission Date (Current Location): 06/11/2016  [place] and [place] Number:  [name] [id number] S (pending - application submitted 62/2 through [employer]) Facility and Address:  [facility],  [place] [address] [place]      Provider Number: [id number]  Attending Physician Name and Address:  Patrecia Pour, MD  Relative Name and Phone Number:       Current Level of Care: Hospital Recommended Level of Care: [place] Prior Approval Number:    Date Approved/Denied:   PASRR Number: [id number]  Discharge Plan:      Current Diagnoses: Patient Active Problem List   Diagnosis Date Noted  . Sepsis secondary to UTI ([place])   . UTI due to extended-spectrum beta lactamase (ESBL) producing Escherichia coli   . Acute pyelonephritis   . Bacteremia due to Escherichia coli   . Colitis, indeterminate   . Uncontrolled type 2 diabetes mellitus with complication ([place])   . 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 Chronic diastolic CHF (congestive heart failure) ([place])   . CHF (congestive heart failure) ([place])   . Abdominal pain   . Colitis   . Hypophosphatemia 06/12/2016  . Hypokalemia 06/11/2016  . Hypocalcemia 06/11/2016  . Acute kidney injury ([place]) 06/11/2016  . Proliferative diabetic retinopathy ([place]) 04/29/2016  . Closed fracture of left distal radius 01/28/2016  . Poor social situation 03/09/2013  . Depression 03/01/2013  . Abnormal mammogram 12/20/2012  . Retinal detachment 11/17/2012  . Diabetes ([place]) 02/09/2007  . Hyperlipidemia 02/09/2007  . Essential hypertension 02/09/2007  . GERD 02/09/2007    Orientation RESPIRATION BLADDER Height & Weight     Self, Time, Situation, Place  Normal Incontinent Weight: 38.3  kg (84 lb 7 oz) Height:  4' 6.5" (138.4 cm)  BEHAVIORAL SYMPTOMS/MOOD NEUROLOGICAL BOWEL NUTRITION STATUS      Continent Diet (Renal/Carb Modified)  AMBULATORY STATUS COMMUNICATION OF NEEDS Skin   Supervision Verbally Normal                       Personal Care Assistance Level of Assistance  Bathing, Dressing Bathing Assistance: Limited assistance   Dressing Assistance: Limited assistance     Functional Limitations Info             SPECIAL CARE FACTORS FREQUENCY  PT (By licensed PT), OT (By licensed OT)                    Contractures      Additional Factors Info  Code Status, Allergies Code Status Info: DNR Allergies Info: Hydrocodone           Current Medications (07/01/2016):  This is the current hospital active medication list Current Facility-Administered Medications  Medication Dose Route Frequency Provider Last Rate Last Dose  . acetaminophen (TYLENOL) tablet 650 mg  650 mg Oral Q6H PRN Gardiner Barefoot, NP   650 mg at 06/23/16 2222   Or  . acetaminophen (TYLENOL) suppository 650 mg  650 mg Rectal Q6H PRN Gardiner Barefoot, NP      . atorvastatin (LIPITOR) tablet 20 mg  20 mg Oral Daily Velvet Bathe, MD   20 mg at 07/01/16 1026  . calcium carbonate (TUMS - dosed in mg elemental calcium) chewable tablet 400 mg of  elemental calcium  2 tablet Oral BID WC Reyne Dumas, MD   400 mg of elemental calcium at 07/01/16 0904  . feeding supplement (NEPRO CARB STEADY) liquid 237 mL  237 mL Oral BID BM Annita Brod, MD   237 mL at 07/01/16 1026  . insulin aspart (novoLOG) injection 0-20 Units  0-20 Units Subcutaneous TID WC Rhetta Mura Schorr, NP      . insulin aspart (novoLOG) injection 0-5 Units  0-5 Units Subcutaneous QHS Velvet Bathe, MD   3 Units at 06/29/16 2156  . insulin aspart protamine- aspart (NOVOLOG MIX 70/30) injection 20 Units  20 Units Subcutaneous BID WC Florencia Reasons, MD   20 Units at 07/01/16 [phone]  . iopamidol (ISOVUE-300) 61 % injection 30  mL  30 mL Oral Once PRN Florencia Reasons, MD      . metoprolol (LOPRESSOR) tablet 50 mg  50 mg Oral BID Allie Bossier, MD   50 mg at 07/01/16 1026  . multivitamin with minerals tablet 1 tablet  1 tablet Oral Daily Velvet Bathe, MD   1 tablet at 07/01/16 1026  . polyethylene glycol (MIRALAX / GLYCOLAX) packet 17 g  17 g Oral Daily Florencia Reasons, MD   17 g at 07/01/16 1026  . pregabalin (LYRICA) capsule 25 mg  25 mg Oral BID Annita Brod, MD   25 mg at 07/01/16 1026  . senna-docusate (Senokot-S) tablet 1 tablet  1 tablet Oral BID Florencia Reasons, MD   1 tablet at 07/01/16 1026  . sodium bicarbonate tablet 650 mg  650 mg Oral BID Florencia Reasons, MD   650 mg at 07/01/16 1026  . sodium chloride flush (NS) 0.9 % injection 3 mL  3 mL Intravenous Q12H Velvet Bathe, MD   3 mL at 06/27/16 2200  . zinc oxide 20 % ointment   Topical PRN Gardiner Barefoot, NP         Discharge Medications: Please see discharge summary for a list of discharge medications.  Relevant Imaging Results:  Relevant Lab Results:   Additional Information SSN: [id number]  Standley Brooking, LCSW

## 2016-07-01 NOTE — Clinical Social Work Placement (Signed)
Patient is set to discharge to [facility] SNF today. Patient aware & CSW left voicemail for patient's sister making her aware as well. Discharge packet given to RN, Sophia. PTAR called for transport.     Raynaldo Opitz, [facility] Clinical Social Worker cell #: [phone]    CLINICAL SOCIAL WORK PLACEMENT  NOTE  Date:  07/01/2016  Patient Details  Name: [name] Michael MRN: [id number] Date of Birth: [birth year]  Clinical Social Work is seeking post-discharge placement for this patient at the [place] level of care (*CSW will initial, date and re-position this form in  chart as items are completed):  Yes   Patient/family provided with [place] Work Department's list of facilities offering this level of care within the geographic area requested by the patient (or if unable, by the patient's family).  Yes   Patient/family informed of their freedom to choose among providers that offer the needed level of care, that participate in Medicare, Medicaid or managed care program needed by the patient, have an available bed and are willing to accept the patient.  Yes   Patient/family informed of [place]'s ownership interest in [facility] and [facility], as well as of the fact that they are under no obligation to receive care at these facilities.  PASRR submitted to EDS on 07/01/16     PASRR number received on 07/01/16     Existing PASRR number confirmed on       FL2 transmitted to all facilities in geographic area requested by pt/family on 06/27/16     FL2 transmitted to all facilities within larger geographic area on       Patient informed that his/her managed care company has contracts with or will negotiate with certain facilities, including the following:        Yes   Patient/family informed of bed offers received.  Patient chooses bed at [facility]     Physician recommends and patient chooses  bed at      Patient to be transferred to [facility] on 07/01/16.  Patient to be transferred to facility by PTAR     Patient family notified on 07/01/16 of transfer.  Name of family member notified:  message left for patient's sister, [name] Michael     PHYSICIAN       Additional Comment:    _______________________________________________ Standley Brooking, LCSW 07/01/2016, 4:13 PM

## 2016-07-01 NOTE — Clinical Social Work Placement (Signed)
Patient discussed in [place] - due to patient requiring sliding scale insulin, patient would require SNF placement. CSW sent information out to [facility] - awaiting evaluation from [place] at [facility].    Raynaldo Opitz, [facility] Clinical Social Worker cell #: [phone]    CLINICAL SOCIAL WORK PLACEMENT  NOTE  Date:  07/01/2016  Patient Details  Name: [name] Michael MRN: [id number] Date of Birth: 04/06/1961  Clinical Social Work is seeking post-discharge placement for this patient at the [place] level of care (*CSW will initial, date and re-position this form in  chart as items are completed):  Yes   Patient/family provided with [place] Work Department's list of facilities offering this level of care within the geographic area requested by the patient (or if unable, by the patient's family).  Yes   Patient/family informed of their freedom to choose among providers that offer the needed level of care, that participate in Medicare, Medicaid or managed care program needed by the patient, have an available bed and are willing to accept the patient.  Yes   Patient/family informed of [place]'s ownership interest in [facility] and [facility], as well as of the fact that they are under no obligation to receive care at these facilities.  PASRR submitted to EDS on 07/01/16     PASRR number received on 07/01/16     Existing PASRR number confirmed on       FL2 transmitted to all facilities in geographic area requested by pt/family on 06/27/16     FL2 transmitted to all facilities within larger geographic area on       Patient informed that his/her managed care company has contracts with or will negotiate with certain facilities, including the following:            Patient/family informed of bed offers received.  Patient chooses bed at       Physician recommends and  patient chooses bed at      Patient to be transferred to   on  .  Patient to be transferred to facility by       Patient family notified on   of transfer.  Name of family member notified:        PHYSICIAN       Additional Comment:    _______________________________________________ Standley Brooking, LCSW 07/01/2016, 11:18 AM

## 2016-07-02 ENCOUNTER — Non-Acute Institutional Stay (SKILLED_NURSING_FACILITY): Payer: Medicaid Other | Admitting: Adult Health

## 2016-07-02 ENCOUNTER — Encounter: Payer: Self-pay | Admitting: Adult Health

## 2016-07-02 DIAGNOSIS — I10 Essential (primary) hypertension: Secondary | ICD-10-CM | POA: Diagnosis not present

## 2016-07-02 DIAGNOSIS — I5032 Chronic diastolic (congestive) heart failure: Secondary | ICD-10-CM | POA: Diagnosis not present

## 2016-07-02 DIAGNOSIS — E782 Mixed hyperlipidemia: Secondary | ICD-10-CM | POA: Diagnosis not present

## 2016-07-02 DIAGNOSIS — E08311 Diabetes mellitus due to underlying condition with unspecified diabetic retinopathy with macular edema: Secondary | ICD-10-CM | POA: Diagnosis not present

## 2016-07-02 DIAGNOSIS — Y95 Nosocomial condition: Secondary | ICD-10-CM

## 2016-07-02 DIAGNOSIS — N179 Acute kidney failure, unspecified: Secondary | ICD-10-CM

## 2016-07-02 DIAGNOSIS — J189 Pneumonia, unspecified organism: Secondary | ICD-10-CM

## 2016-07-02 DIAGNOSIS — I639 Cerebral infarction, unspecified: Secondary | ICD-10-CM | POA: Diagnosis not present

## 2016-07-02 LAB — CULTURE, BLOOD (ROUTINE X 2)
CULTURE: NO GROWTH
Culture: NO GROWTH

## 2016-07-02 NOTE — Progress Notes (Signed)
Location:   [facility]   Place of Service:  SNF (31)   CODE STATUS: full code until otherwise determined.   Allergies  Allergen Reactions  . Hydrocodone Nausea And Vomiting    Chief Complaint  Patient presents with  . Hospitalization Follow-up    HPI:  She has been hospitalized for sepsis due to pyelonephritis; acute renal failure; acute colitis and anemia. She is here for short term rehab with her goal to return back home. She has been running a fever last night of 100.0  She has required 02 for low 02 sats. She is complaining of a cough. She is a somewhat poor historian.  I am not certain at this time if this does not represent a long term placement for her.   Past Medical History:  Diagnosis Date  . Diabetes mellitus   . Hyperlipidemia   . Hypertension   . Stroke [facility]) 04-01-11   left frontal subcortical, saw Dr. [name]   . TIA (transient ischemic attack) 03-12-11    Past Surgical History:  Procedure Laterality Date  . OPEN REDUCTION INTERNAL FIXATION (ORIF) DISTAL RADIAL FRACTURE Left 01/28/2016   Procedure: OPEN REDUCTION INTERNAL FIXATION (ORIF) DISTAL RADIAL FRACTURE;  Surgeon: Iran Planas, MD;  Location: [place];  Service: Orthopedics;  Laterality: Left;  . [place]     age 35    Social History   Social History  . Marital status: Single    Spouse name: N/A  . Number of children: N/A  . Years of education: N/A   Occupational History  . Not on file.   Social History Main Topics  . Smoking status: Current Every Day Smoker    Packs/day: 1.00    Years: 32.00    Types: Cigarettes    Last attempt to quit: 09/23/2012  . Smokeless tobacco: Never Used     Comment: or less  . Alcohol use No  . Drug use: No  . Sexual activity: No   Other Topics Concern  . Not on file   Social History Narrative   Lives with brother and sister-in-law. Does not work.         Family History  Problem  Relation Age of Onset  . Stroke Mother   . Diabetes Mother   . Kidney failure Mother   . Heart failure Mother   . Stroke Father   . Cancer Sister     Breast- 50's      VITAL SIGNS BP 118/68   Pulse 100   Temp 100 F (37.8 C)   Resp 20   Ht 4\' 6"  (1.372 m)   Wt 83 lb (37.6 kg)   LMP 03/06/2013   SpO2 98%   BMI 20.01 kg/m   Patient's Medications  New Prescriptions   No medications on file  Previous Medications   ASPIRIN 81 MG TABLET    Take 1 tablet (81 mg total) by mouth daily.   ATORVASTATIN (LIPITOR) 20 MG TABLET    Take 1 tablet (20 mg total) by mouth daily.   CALCIUM CARBONATE (TUMS - DOSED IN MG ELEMENTAL CALCIUM) 500 MG CHEWABLE TABLET    Chew 2 tablets (400 mg of elemental calcium total) by mouth 2 (two) times daily with a meal.   INSULIN ASPART PROTAMINE- ASPART (NOVOLOG MIX 70/30) (70-30) 100 UNIT/ML INJECTION    Inject 0.2 mLs (20 Units total) into the skin 2 (two) times daily with a  meal.   METOPROLOL (LOPRESSOR) 50 MG TABLET    Take 1 tablet (50 mg total) by mouth 2 (two) times daily.   MULTIPLE VITAMIN (MULTIVITAMIN) TABLET    Take 1 tablet by mouth daily.     SODIUM BICARBONATE 650 MG TABLET    Take 1 tablet (650 mg total) by mouth 2 (two) times daily.  Modified Medications   No medications on file  Discontinued Medications     SIGNIFICANT DIAGNOSTIC EXAMS  06-11-16: ct of abdomen and pelvis: 1. Kidneys appear an edematous bilaterally and there is moderate perinephric fat stranding, findings are nonspecific but could be due to medical renal disease or possible pyelonephritis, clinical correlation recommended. Multiple punctate stones within the bilateral kidneys without ureteral stone identified. 2. Multifocal areas of mild wall thickening involving the colon. There are fluid-filled loops of small bowel with suggested areas of mild wall thickening, findings could relate to enteritis/colitis of infectious or inflammatory etiology. There is no evidence for a  bowel obstruction. The appendix is normal.  06-12-16: chest x-ray: 1. No acute consolidation or effusion 2. Minimal linear peripheral opacities on the right could relate tovatelectasis.  06-12-16: ct of abdomen and pelvis: 1. Scattered areas of wall thickening seen in small bowel and colon on the previous study resolved in the interval and may well within related to underdistention previously. 2. Both kidneys remain enlarged and edematous with perinephric edema/inflammation. No hydroureteronephrosis. Tiny bilateral nonobstructing stones are again noted. 3. Dystrophic calcification identified in the region of the uncinate process/mesenteric root. This may be related to focal parenchymal calcification in the pancreas or dystrophic calcification within central abdominal lymph nodes. Follow-up evaluation with intravenous contrast could be used when renal function normalizes. 4. New bibasilar atelectasis with tiny bilateral pleural effusions. 5.  Abdominal Aortic Atherosclerois (ICD10-170.0)   06-17-16: 2-d echo: - Left ventricle: The cavity size was normal. Systolic function was vigorous. The estimated ejection fraction was in the range of 65% to 70%. Wall motion was normal; there were no regional wall motion abnormalities. Features are consistent with a pseudonormal left ventricular filling pattern, with concomitant abnormal relaxation and increased filling pressure (grade 2 diastolic dysfunction). Doppler parameters are consistent with indeterminate ventricular filling pressure. - Aortic valve: Transvalvular velocity was within the normal range.  There was no stenosis. There was no regurgitation. - Mitral valve: Transvalvular velocity was within the normal range. There was no evidence for stenosis. There was mild regurgitation. - Right ventricle: The cavity size was normal. Wall thickness was normal. Systolic function was normal. - Atrial septum: No defect or patent foramen ovale was identified by  color flow Doppler. - Tricuspid valve: There was trivial regurgitation. - Pulmonary arteries: Systolic pressure was within the normal range. PA peak pressure: 24 mm Hg (S).   06-27-15; renal ultrasound: No focal renal abnormality identified. No evidence of renal or perirenal abscess. No evidence of hydronephrosis .  06-27-15: ct of abdomen and pelvis: 1. No acute infiltrate or effusion in the chest. 2. Edematous enlarged kidneys suggests ongoing inflammation or infection. Persistent moderate perinephric fat stranding. No gross hydronephrosis or hydroureter. Multiple punctate intrarenal calculi again visualized. 3. Small calcified gallstone is difficult to appreciate on current exam. The gallbladder is contracted.   LABS REVIEWED:   02-06-16: chol 253; trig 553; hdl 36 04-29-16: hgb a1c 10.9  06-11-16: wbc 27.3; hgb 7.6; hct 22.4; mcv 78.6 ;plt 566; glucose 279; bun 43; creat 2.49; k+ 2.1; na++ 131; ca 6.6; liver normal albumin 2.0; mag 1.1;  phos 2.3; tsh 1.033; urine culture: e-coli 06-12-16; wbc 22.;4 hbg 7.0; hct 20.4; mcv 78.8;plt 480; glucose 107; bun 37; creat 1.97; k+ 2.9; na++ 139; ca 6.0; liver normal albumin 1.7; vit B 12: 192; folate 11.0; iron 6; tibc 174; ferritin 373; blood culture: e-coli; enterobacteriaceae species  06-14-16: glucose 254; bun 42; creat 2.52; k+ 3.6; na++ 141; ca 6.2 06-17-16: wbc 19.9; hgb 11.5; hct 34.0; mcv 79.4; plt 301; glucose 347; bun 48; creat 2.74; k+ 3.1; na++ 136; ca 7.8; mag 1.6 06-20-16: blood culture: no growth  06-22-16: wbc 20.6; hgb 11.1; hct 34.5 ;mcv 83.9; plt 453; glucose 256; bun 68; creat 2.58; k+ 5.5; na++ 134; ca 8.3;  06-28-15: vit D 32.9; ionized ca 4.8; PTH 23; blood and urine cultures: no growth 06-29-15: EPO 15.3; sed rate 110; CRP 9.0 06-30-15: wbc 14.8; hgb 9.2; hct 29.0; mcv 85.3; plt 555; glucose 178; bun 75; creat 2.37; k+ 4.6;na++ 136; ca 8.6 vit B 12: 273; folate 38; iron 24; tibc 255; ferritin 1102     Review of Systems    Constitutional: Negative for malaise/fatigue.  Respiratory: Positive for cough. Negative for shortness of breath.   Cardiovascular: Negative for chest pain and leg swelling.  Gastrointestinal: Negative for abdominal pain, constipation and heartburn.  Musculoskeletal: Negative for back pain, joint pain and myalgias.  Skin: Negative.   Neurological: Negative for dizziness.  Psychiatric/Behavioral: The patient is not nervous/anxious.     Physical Exam  Constitutional: No distress.  Eyes: Conjunctivae are normal.  Neck: Neck supple. No JVD present. No thyromegaly present.  Cardiovascular: Normal rate, regular rhythm and intact distal pulses.   Respiratory: Effort normal. No respiratory distress. She has wheezes.  Rhonchi present  On 02   GI: Soft. Bowel sounds are normal. She exhibits no distension. There is no tenderness.  Musculoskeletal: She exhibits no edema.  Able to move all extremities   Lymphadenopathy:    She has no cervical adenopathy.  Neurological: She is alert.  Skin: Skin is warm and dry. She is not diaphoretic.  Psychiatric: She has a normal mood and affect.     ASSESSMENT/ PLAN:  1.  Chronic diastolic heart failure: EF 65-70% (06-17-16): is not on diuretic will continue lopressor 50 mg twice daily   2. Hypertension: will continue lopressor 50 mg twice daily  Asa 81 mg daily   3.  Dyslipidemia: chol 253 trig 553; will continue lipitor 20 mg daily   4. Diabetes: hgb a1c 10.9; has diabetic proliferative retinopathy  will stop novolog mix and will begin lantus 30 units nightly with novolog 10 units after meals.   5.  Renal failure with metabolic acidosis:  bun/creat: 75/2.37   Will continue sodium bicarb 650 mg twice daily will monitor   6.  Hypocalcemia: will continue calcium 1 gm three times daily   7. Sepsis: has completed abt; wbc 14.8   8. Pneumonia: will get cbc; bmp; chest x-ray: will begin levaquin 750 mg daily for 14 days; with florastor twice daily for  one month. Will continue 02 at this time  9. CVA (2012): is neurologically stable will continue asa 81 mg daily   Time spent with patient  50   minutes >50% time spent counseling; reviewing medical record; tests; labs; and developing future plan of care      Ok Edwards NP [facility]  Contact [phone] Monday through Friday 8am- 5pm  After hours call [phone]

## 2016-07-03 ENCOUNTER — Non-Acute Institutional Stay (SKILLED_NURSING_FACILITY): Payer: Medicaid Other | Admitting: Internal Medicine

## 2016-07-03 ENCOUNTER — Encounter: Payer: Self-pay | Admitting: Internal Medicine

## 2016-07-03 DIAGNOSIS — J189 Pneumonia, unspecified organism: Secondary | ICD-10-CM | POA: Insufficient documentation

## 2016-07-03 DIAGNOSIS — R627 Adult failure to thrive: Secondary | ICD-10-CM | POA: Diagnosis not present

## 2016-07-03 DIAGNOSIS — N179 Acute kidney failure, unspecified: Secondary | ICD-10-CM | POA: Diagnosis not present

## 2016-07-03 DIAGNOSIS — Z8673 Personal history of transient ischemic attack (TIA), and cerebral infarction without residual deficits: Secondary | ICD-10-CM | POA: Diagnosis not present

## 2016-07-03 DIAGNOSIS — E43 Unspecified severe protein-calorie malnutrition: Secondary | ICD-10-CM | POA: Diagnosis not present

## 2016-07-03 DIAGNOSIS — Z794 Long term (current) use of insulin: Secondary | ICD-10-CM | POA: Diagnosis not present

## 2016-07-03 DIAGNOSIS — N184 Chronic kidney disease, stage 4 (severe): Secondary | ICD-10-CM

## 2016-07-03 DIAGNOSIS — D631 Anemia in chronic kidney disease: Secondary | ICD-10-CM | POA: Diagnosis not present

## 2016-07-03 DIAGNOSIS — Y95 Nosocomial condition: Secondary | ICD-10-CM | POA: Insufficient documentation

## 2016-07-03 DIAGNOSIS — E782 Mixed hyperlipidemia: Secondary | ICD-10-CM

## 2016-07-03 DIAGNOSIS — E113599 Type 2 diabetes mellitus with proliferative diabetic retinopathy without macular edema, unspecified eye: Secondary | ICD-10-CM

## 2016-07-03 DIAGNOSIS — I10 Essential (primary) hypertension: Secondary | ICD-10-CM | POA: Diagnosis not present

## 2016-07-03 DIAGNOSIS — I5032 Chronic diastolic (congestive) heart failure: Secondary | ICD-10-CM

## 2016-07-03 DIAGNOSIS — R413 Other amnesia: Secondary | ICD-10-CM

## 2016-07-03 DIAGNOSIS — I639 Cerebral infarction, unspecified: Secondary | ICD-10-CM | POA: Insufficient documentation

## 2016-07-03 NOTE — Progress Notes (Signed)
Patient ID: [name] Michael, female   DOB: 03/30/1961, 56 y.o.   MRN: [id number]    HISTORY AND PHYSICAL   DATE: 07/03/2016  Location:    [place] Room Number: 081 A Place of Service: SNF (31)   Extended Emergency Contact Information Primary Emergency Contact: [place] Phone: [phone] Relation: Sister  Advanced Directive information Does Patient Have a Medical Advance Directive?: Yes, Type of Advance Directive: Out of facility DNR (pink MOST or yellow form), Pre-existing out of facility DNR order (yellow form or pink MOST form): Yellow form placed in chart (order not valid for inpatient use)  Chief Complaint  Patient presents with  . 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 initial CT abdomen/pelvis revealed colitis; nonobstructive stones. Urology consulted but no intervention recommended. Foley cath inserted but was removed without complication on 97/02/63. She was tx with IV abx (zosyn, rocephin, meropenem) and IV lasix. renal fxn worsened and nephrology consulted. Lasix stopped and she was given gentle hydration. Repeat urine cx neg. She rec'd 2 units PRBCs for low Hgb. Diabetic meds adjusted. Hgb 7.6-->6.6-->9.2; WBCs 27.3K with abs Neutrophils 24.6K-->14.8K with abs N 11.3K; Plts 566K -->555K; K 2.1-->5.6-->5; Na 131-->137; Ca 6.6-->9.5; Cr 2.49-->2.89-->2.62; albumin dropped to 1.7-->2.5; Vit B12 level 192; folate nml; iron 6; ferritin 373; sed rate 110; CRP 9 at d/c; A1c 10.9% in Nov 2017. She presents to SNF for short term rehab.  Today she c/o dry cracking lips. No other concerns. No f/c  but she has had low grade temps (Tm 100.23F), CP, SOB. No N/V. (+) cough. No urinary c/o. O2 sats have been low and she has req'd [place] O2. She was started on po levaquin yesterday for presumed pneumonia. Pt is a poor historian due to memory deficits. Hx obtained from chart. No nursing issues. No falls.  Chronic diastolic heart failure -  EF 65-70% (06-17-16); currently off diuretic; takes lopressor 50 mg twice daily   Hypertension - stable on lopressor 50 mg twice daily; also on ASA 81 mg daily   Dyslipidemia - Tchol 253; TG 553; takes lipitor 20 mg daily   DM - uncontrolled. 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 Currently on lantus 30 units nightly with novolog 10 units after meals. CBG 229 today. No low BS reactions  CKD stage IV with metabolic acidosis - Cr 7.85. Stable on sodium bicarb 650 mg twice daily will monitor   Hypocalcemia - stable on calcium BID. Ca at d/c 9.5  Hx CVA (2012) - stable. She has memory deficits. Takes ASA 81 mg daily   FTT/severe protein calorie malnutrition - gets MVI daily. Weight is 83lb   FHx (+) DM, HF, renal failure   Past Medical History:  Diagnosis Date  . Diabetes mellitus   . Hyperlipidemia   . Hypertension   . Stroke [facility]) 04-01-11   left frontal subcortical, saw Dr. [name]   . TIA (transient ischemic attack) 03-12-11    Past Surgical History:  Procedure Laterality Date  . OPEN REDUCTION INTERNAL FIXATION (ORIF) DISTAL RADIAL FRACTURE Left 01/28/2016  Procedure: OPEN REDUCTION INTERNAL FIXATION (ORIF) DISTAL RADIAL FRACTURE;  Surgeon: Iran Planas, MD;  Location: [place];  Service: Orthopedics;  Laterality: Left;  . [place]     age 69    Patient Care Team: Arnoldo Morale, MD as PCP - General (Family Medicine) Kennith Center, RD as Dietitian (Family Medicine)  Social History   Social History  . Marital  status: Single    Spouse name: N/A  . Number of children: N/A  . Years of education: N/A   Occupational History  . Not on file.   Social History Main Topics  . Smoking status: Current Every Day Smoker    Packs/day: 1.00    Years: 32.00    Types: Cigarettes    Last attempt to quit: 09/23/2012  . Smokeless tobacco: Never Used     Comment: or less  . Alcohol use No  . Drug use: No  . Sexual activity: No   Other Topics Concern  . Not on file   Social History Narrative   Lives with brother and sister-in-law. Does not work.           reports that she has been smoking Cigarettes.  She has a 32.00 pack-year smoking history. She has never used smokeless tobacco. She reports that she does not drink alcohol or use drugs.  Family History  Problem Relation Age of Onset  . Stroke Mother   . Diabetes Mother   . Kidney failure Mother   . Heart failure Mother   . Stroke Father   . Cancer Sister     Breast- 33's   Family Status  Relation Status  . Mother   . Father   . Sister     Immunization History  Administered Date(s) Administered  . Influenza, Seasonal, Injecte, Preservative Fre 05/25/2012  . PPD Test 07/01/2016  . Pneumococcal Polysaccharide-23 05/25/2012    Allergies  Allergen Reactions  . Hydrocodone Nausea And Vomiting    Medications: Patient's Medications  New Prescriptions   No medications on file  Previous Medications   ASPIRIN 81 MG TABLET    Take 1 tablet (81 mg total) by mouth daily.   ATORVASTATIN (LIPITOR) 20 MG TABLET    Take 1 tablet (20 mg total) by mouth daily.   CALCIUM CARBONATE (TUMS - DOSED IN MG ELEMENTAL CALCIUM) 500 MG CHEWABLE TABLET    Chew 2 tablets (400 mg of elemental calcium total) by mouth 2 (two) times daily with a meal.   INSULIN ASPART PROTAMINE- ASPART (NOVOLOG MIX 70/30) (70-30) 100 UNIT/ML INJECTION    Inject 0.2 mLs (20 Units total) into the skin 2 (two) times daily with a meal.   LEVOFLOXACIN (LEVAQUIN) 750 MG TABLET    Take  750 mg by mouth daily.   METOPROLOL (LOPRESSOR) 50 MG TABLET    Take 1 tablet (50 mg total) by mouth 2 (two) times daily.   MULTIPLE VITAMIN (MULTIVITAMIN) TABLET    Take 1 tablet by mouth daily.     SODIUM BICARBONATE 650 MG TABLET    Take 1 tablet (650 mg total) by mouth 2 (two) times daily.  Modified Medications   No medications on file  Discontinued Medications   No medications on file    Review of Systems  Unable to perform ROS: Other    Vitals:   07/03/16 1039  BP: 124/68  Pulse: 98  Resp: 20  Temp: 97.9 F (36.6 C)  TempSrc: Oral  SpO2: 95%  Weight: 83 lb (37.6 kg)  Height: 4\' 6"  (1.372 m)   Body mass index is 20.01 kg/m.  Physical Exam  Constitutional: She appears well-developed.  Frail appearing sitting in w/c in NAD  HENT:  Mouth/Throat: Oropharynx is clear and moist. No oropharyngeal exudate.  MMM; no oral thrush  Eyes: Pupils are equal, round, and reactive to light. No scleral icterus.  Neck: Neck supple. Carotid bruit is not present. No tracheal deviation present.  Cardiovascular: Normal rate, regular rhythm and intact distal pulses.  Exam reveals no gallop and no friction rub.   Murmur (1/6 SEM) heard. No LE edema b/l. no calf TTP.   Pulmonary/Chest: Effort normal. No stridor. No respiratory distress. She has no wheezes. She has rales (expiratory; right base). She exhibits no tenderness.  Reduced BS b/l  Abdominal: Soft. Bowel sounds are normal. She exhibits no distension and no mass. There is no hepatomegaly. There is no tenderness. There is no rebound and no guarding.  Musculoskeletal: She exhibits edema.  Lymphadenopathy:    She has no cervical adenopathy.  Neurological: She is alert.  Skin: Skin is warm and dry. No rash noted.  Psychiatric: She has a normal mood and affect. Her behavior is normal.     Labs reviewed: Admission on 06/11/2016, Discharged on 07/01/2016  No results displayed because visit has over 200 results.    Office Visit on  04/29/2016  Component Date Value Ref Range Status  . POC Glucose 04/29/2016 265* 70 - 99 mg/dl Final  . Hemoglobin A1C 04/29/2016 10.9   Final  Admission on 04/15/2016, Discharged on 04/15/2016  Component Date Value Ref Range Status  . Glucose-Capillary 04/15/2016 277* 65 - 99 mg/dL Final  . Comment 1 04/15/2016 Notify RN   Final    Ct Abdomen Pelvis Wo Contrast  Result Date: 06/26/2016 CLINICAL DATA:  History of diabetes hypertension CVA, pyelonephritis and sepsis EXAM: CT CHEST, ABDOMEN AND PELVIS WITHOUT CONTRAST TECHNIQUE: Multidetector CT imaging of the chest, abdomen and pelvis was performed following the standard protocol without IV contrast. COMPARISON:  06/17/2016, 06/14/2016 FINDINGS: CT CHEST FINDINGS Cardiovascular: Limited evaluation without intravenous contrast. Mild atherosclerotic calcifications. No aneurysmal dilatation. Mild coronary artery calcifications. Normal heart size. No large pericardial effusion. Mediastinum/Nodes: Thyroid gland within normal limits. Trachea is midline. No significantly enlarged mediastinal lymph nodes. Limited evaluation for hilar nodes without contrast. Esophagus is unremarkable. Lungs/Pleura: Lungs are clear. No pleural effusion or pneumothorax. Musculoskeletal: No chest wall mass or suspicious bone lesions identified. CT ABDOMEN PELVIS FINDINGS Hepatobiliary: No focal hepatic abnormality. Gallbladder is contracted. Small stone is not as well appreciated on the current exam. No wall thickening. No biliary dilatation. Pancreas: Unremarkable. No pancreatic ductal dilatation or surrounding inflammatory changes. Small calcifications near the ends an aunt process of the pancreas as before. Spleen: Normal in size without focal abnormality. Adrenals/Urinary Tract: Adrenal glands are within normal limits. Bilateral nephromegaly. Moderate perinephric fat stranding as before. No significant hydronephrosis. Multiple punctate intrarenal calculi. No definitive ureteral  stones. Bladder has a small amount of air anteriorly. Bladder is dilated. Stomach/Bowel: Moderate stool in the colon. No wall thickening. Normal appendix. No dilated small bowel. Vascular/Lymphatic: Aortic atherosclerosis. No enlarged abdominal or pelvic lymph nodes. Reproductive: Uterus and bilateral adnexa are unremarkable. Other: Small amount of free fluid in the upper abdomen. No free air. Musculoskeletal: No acute or significant osseous findings. IMPRESSION: 1. No acute infiltrate or effusion in the chest. 2. Edematous enlarged kidneys suggests ongoing inflammation or  infection. Persistent moderate perinephric fat stranding. No gross hydronephrosis or hydroureter. Multiple punctate intrarenal calculi again visualized. 3. Small calcified gallstone is difficult to appreciate on current exam. The gallbladder is contracted. Electronically Signed   By: Donavan Foil M.D.   On: 06/26/2016 20:45   Ct Abdomen Pelvis Wo Contrast  Result Date: 06/17/2016 CLINICAL DATA:  Diarrhea with nausea and vomiting.  Weakness. EXAM: CT ABDOMEN AND PELVIS WITHOUT CONTRAST TECHNIQUE: Multidetector CT imaging of the abdomen and pelvis was performed following the standard protocol without IV contrast. COMPARISON:  06/11/2016. FINDINGS: Lower chest: Bibasilar atelectasis with tiny bilateral pleural effusions. Hepatobiliary: No focal abnormality in the liver on this study without intravenous contrast. Gallbladder is decompressed with tiny calcified gallstone noted. No intrahepatic or extrahepatic biliary dilation. Pancreas: No focal mass lesion. No dilatation of the main duct. No intraparenchymal cyst. No peripancreatic edema. Spleen: No splenomegaly. No focal mass lesion. Adrenals/Urinary Tract: No adrenal nodule or mass. Both kidneys remain within enlarged appearance and ill-defined margins suggesting edema/inflammation. Tiny nonobstructing stones are noted bilaterally. No evidence for hydroureteronephrosis. Foley catheter  decompresses the urinary bladder. Stomach/Bowel: Stomach is nondistended. No gastric wall thickening. No evidence of outlet obstruction. Duodenum is normally positioned as is the ligament of Treitz. No small bowel wall thickening. No small bowel dilatation. The terminal ileum is normal. The appendix is not visualized, but there is no edema or inflammation in the region of the cecum. No gross colonic mass. No colonic wall thickening. No substantial diverticular change. Vascular/Lymphatic: There is abdominal aortic atherosclerosis without aneurysm. There is no gastrohepatic or hepatoduodenal ligament lymphadenopathy. No intraperitoneal or retroperitoneal lymphadenopathy. Dystrophic calcifications seen anterior to the aorta and posterior to the SMV may be related to focal dystrophic calcification in the uncinate process of the pancreas although calcified lymph node in the root of the small bowel mesentery would also be a consideration. Reproductive: The uterus has normal CT imaging appearance. There is no adnexal mass. Other: No intraperitoneal free fluid. Musculoskeletal: Bone windows reveal no worrisome lytic or sclerotic osseous lesions. IMPRESSION: 1. Scattered areas of wall thickening seen in small bowel and colon on the previous study resolved in the interval and may well within related to underdistention previously. 2. Both kidneys remain enlarged and edematous with perinephric edema/inflammation. No hydroureteronephrosis. Tiny bilateral nonobstructing stones are again noted. 3. Dystrophic calcification identified in the region of the uncinate process/mesenteric root. This may be related to focal parenchymal calcification in the pancreas or dystrophic calcification within central abdominal lymph nodes. Follow-up evaluation with intravenous contrast could be used when renal function normalizes. 4. New bibasilar atelectasis with tiny bilateral pleural effusions. 5.  Abdominal Aortic Atherosclerois (ICD10-170.0)  Electronically Signed   By: Misty Stanley M.D.   On: 06/17/2016 15:36   Ct Abdomen Pelvis Wo Contrast  Result Date: 06/11/2016 CLINICAL DATA:  Fall in the shower, week with diarrhea and nausea and vomiting EXAM: CT ABDOMEN AND PELVIS WITHOUT CONTRAST TECHNIQUE: Multidetector CT imaging of the abdomen and pelvis was performed following the standard protocol without IV contrast. COMPARISON:  None. FINDINGS: Lower chest: Lung bases demonstrate mild dependent atelectasis posteriorly. Minimal bronchiectasis posterior right lower lobe. No pleural effusion. Heart size nonenlarged. Hepatobiliary: No focal liver abnormality is seen. No gallstones, gallbladder wall thickening, or biliary dilatation. Pancreas: No pancreatic ductal dilatation or surrounding inflammatory changes. Few scattered calcifications at the head and uncinate process of the pancreas suggestive of chronic pancreatitis. Spleen: Normal in size without focal abnormality. Adrenals/Urinary Tract: Adrenal glands are within  normal limits. Kidneys appear edematous and there is moderate perinephric fat stranding. There are multiple punctate nonobstructing stones within the bilateral kidneys. No ureteral stone. Bladder is normal. Stomach/Bowel: The stomach is nonenlarged. Multifocal areas of mild colon wall thickening throughout the colon. Majority of the contrast is in the colon. Fluid filled loops of nondilated small bowel with suggestion of possible wall thickening. Normal appendix. Vascular/Lymphatic: Aortic atherosclerosis. No enlarged abdominal or pelvic lymph nodes. Reproductive: Uterus and bilateral adnexa are unremarkable. Other: No free air. Musculoskeletal: No acute or significant osseous findings. IMPRESSION: 1. Kidneys appear an edematous bilaterally and there is moderate perinephric fat stranding, findings are nonspecific but could be due to medical renal disease or possible pyelonephritis, clinical correlation recommended. Multiple punctate  stones within the bilateral kidneys without ureteral stone identified. 2. Multifocal areas of mild wall thickening involving the colon. There are fluid-filled loops of small bowel with suggested areas of mild wall thickening, findings could relate to enteritis/colitis of infectious or inflammatory etiology. There is no evidence for a bowel obstruction. The appendix is normal. Electronically Signed   By: Donavan Foil M.D.   On: 06/11/2016 19:31   Dg Chest 1 View  Result Date: 06/14/2016 CLINICAL DATA:  Shortness of breath. EXAM: CHEST 1 VIEW COMPARISON:  06/12/2016 FINDINGS: Hazy densities at the lung bases, particularly on the right side. Difficult to exclude effusions. Upper lungs remain clear. Heart size is normal. The trachea is midline. Negative for a pneumothorax. IMPRESSION: Subtle bibasilar chest densities. Findings could represent atelectasis. Difficult to exclude tiny effusions. Electronically Signed   By: Markus Daft M.D.   On: 06/14/2016 13:56   Ct Chest Wo Contrast  Result Date: 06/26/2016 CLINICAL DATA:  History of diabetes hypertension CVA, pyelonephritis and sepsis EXAM: CT CHEST, ABDOMEN AND PELVIS WITHOUT CONTRAST TECHNIQUE: Multidetector CT imaging of the chest, abdomen and pelvis was performed following the standard protocol without IV contrast. COMPARISON:  06/17/2016, 06/14/2016 FINDINGS: CT CHEST FINDINGS Cardiovascular: Limited evaluation without intravenous contrast. Mild atherosclerotic calcifications. No aneurysmal dilatation. Mild coronary artery calcifications. Normal heart size. No large pericardial effusion. Mediastinum/Nodes: Thyroid gland within normal limits. Trachea is midline. No significantly enlarged mediastinal lymph nodes. Limited evaluation for hilar nodes without contrast. Esophagus is unremarkable. Lungs/Pleura: Lungs are clear. No pleural effusion or pneumothorax. Musculoskeletal: No chest wall mass or suspicious bone lesions identified. CT ABDOMEN PELVIS FINDINGS  Hepatobiliary: No focal hepatic abnormality. Gallbladder is contracted. Small stone is not as well appreciated on the current exam. No wall thickening. No biliary dilatation. Pancreas: Unremarkable. No pancreatic ductal dilatation or surrounding inflammatory changes. Small calcifications near the ends an aunt process of the pancreas as before. Spleen: Normal in size without focal abnormality. Adrenals/Urinary Tract: Adrenal glands are within normal limits. Bilateral nephromegaly. Moderate perinephric fat stranding as before. No significant hydronephrosis. Multiple punctate intrarenal calculi. No definitive ureteral stones. Bladder has a small amount of air anteriorly. Bladder is dilated. Stomach/Bowel: Moderate stool in the colon. No wall thickening. Normal appendix. No dilated small bowel. Vascular/Lymphatic: Aortic atherosclerosis. No enlarged abdominal or pelvic lymph nodes. Reproductive: Uterus and bilateral adnexa are unremarkable. Other: Small amount of free fluid in the upper abdomen. No free air. Musculoskeletal: No acute or significant osseous findings. IMPRESSION: 1. No acute infiltrate or effusion in the chest. 2. Edematous enlarged kidneys suggests ongoing inflammation or infection. Persistent moderate perinephric fat stranding. No gross hydronephrosis or hydroureter. Multiple punctate intrarenal calculi again visualized. 3. Small calcified gallstone is difficult to appreciate on current exam. The  gallbladder is contracted. Electronically Signed   By: Donavan Foil M.D.   On: 06/26/2016 20:45   US Renal  Result Date: 06/26/2016 CLINICAL DATA:  Elevated creatinine.  Abnormal CT. EXAM: RENAL / URINARY TRACT ULTRASOUND COMPLETE COMPARISON:  CT 06/17/2016. FINDINGS: Right Kidney: Length: 12.5 cm. Echogenicity within normal limits. No mass or hydronephrosis visualized. Left Kidney: Length: 12.1 cm. Echogenicity within normal limits. No mass or hydronephrosis visualized. Bladder: Appears normal for degree  of bladder distention. IMPRESSION: No focal renal abnormality identified. No evidence of renal or perirenal abscess. No evidence of hydronephrosis . Electronically Signed   By: Marcello Moores  Register   On: 06/26/2016 12:34   Dg Chest Port 1 View  Result Date: 06/12/2016 CLINICAL DATA:  Fever and chills sepsis EXAM: PORTABLE CHEST 1 VIEW COMPARISON:  01/28/2016 FINDINGS: Slightly elevated right diaphragm. No acute consolidation or pleural effusion. Minimal linear peripheral opacities on the right could relate to linear atelectasis. Heart size upper normal. Pulmonary vascularity within normal limits. No pneumothorax. IMPRESSION: 1. No acute consolidation or effusion 2. Minimal linear peripheral opacities on the right could relate to atelectasis. Electronically Signed   By: Donavan Foil M.D.   On: 06/12/2016 22:10   Dg Abd Portable 1v  Result Date: 06/12/2016 CLINICAL DATA:  56 year old female with abdominal pain, colitis, and abdominal distention. EXAM: PORTABLE ABDOMEN - 1 VIEW COMPARISON:  Abdominal CT dated 06/11/2016 FINDINGS: Air is noted within the stomach and throughout the colon. Oral contrast from recent CT is seen in the distal colon and rectosigmoid. There is no bowel dilatation or evidence of obstruction. No free air or radiopaque calculi noted. The osseous structures and the soft tissues are grossly unremarkable. Right lung base linear densities likely atelectasis/scarring. IMPRESSION: No bowel obstruction or free air. Electronically Signed   By: Anner Crete M.D.   On: 06/12/2016 23:35     Assessment/Plan   ICD-9-CM ICD-10-CM   1. HAP (hospital-acquired pneumonia) - clinically improving 486 J18.9   2. FTT (failure to thrive) in adult - failing to change as expected 783.7 R62.7   3. 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 CKD (chronic kidney disease) stage 4, GFR 15-29 ml/min (HCC) 585.4 N18.4   5. Essential hypertension 401.9 I10   6. Chronic diastolic CHF (congestive heart failure) (HCC) 428.32 I50.32    428.0    7. Mixed hyperlipidemia 272.2 E78.2   8. History of stroke V12.54 Z86.73   9. Severe protein-calorie malnutrition Altamease Oiler: less than 60% of standard weight) ([place]) 262 E43   10. Anemia in stage 4 chronic kidney disease (HCC) 285.21 N18.4    585.4 D63.1   11. Memory loss or impairment 780.93 R41.3     Await lab results  Finish abx  Cont other meds as ordered  May have nutritional supplements as indicated  May use moisturizer to lips prn  PT/OT/ST as ordered  f/u with specialists as scheduled  GOAL: short term rehab and d/c home when medically appropriate. Communicated with pt and nursing.  Will follow  Cletus Mehlhoff S. Perlie Gold  [facility] [address] [place], [place] [id number] [phone] Cell (Monday-Friday 8 AM - 5 PM) [phone] After 5 PM and follow prompts

## 2016-07-25 ENCOUNTER — Encounter: Payer: Self-pay | Admitting: Adult Health

## 2016-07-25 ENCOUNTER — Non-Acute Institutional Stay (SKILLED_NURSING_FACILITY): Payer: Medicaid Other | Admitting: Adult Health

## 2016-07-25 DIAGNOSIS — I639 Cerebral infarction, unspecified: Secondary | ICD-10-CM | POA: Diagnosis not present

## 2016-07-25 DIAGNOSIS — I5032 Chronic diastolic (congestive) heart failure: Secondary | ICD-10-CM

## 2016-07-25 DIAGNOSIS — J189 Pneumonia, unspecified organism: Secondary | ICD-10-CM

## 2016-07-25 DIAGNOSIS — E1129 Type 2 diabetes mellitus with other diabetic kidney complication: Secondary | ICD-10-CM | POA: Diagnosis not present

## 2016-07-25 DIAGNOSIS — E1165 Type 2 diabetes mellitus with hyperglycemia: Secondary | ICD-10-CM

## 2016-07-25 DIAGNOSIS — Y95 Nosocomial condition: Secondary | ICD-10-CM

## 2016-07-25 NOTE — Progress Notes (Signed)
Location:   [facility] Room Number: 979G Place of Service:  SNF (31)    CODE STATUS: dnr  Allergies  Allergen Reactions  . Hydrocodone Nausea And Vomiting    Chief Complaint  Patient presents with  . Discharge Note    HPI:   She is being discharged to assisted living. She will need home health for pt/ot. She will utilize a cane for ambulation. She will not need prescriptions to be written. She will follow up medically with the provider at the receiving facility.    Past Medical History:  Diagnosis Date  . Diabetes mellitus   . Hyperlipidemia   . Hypertension   . Stroke [facility]) 04-01-11   left frontal subcortical, saw Dr. [name]   . TIA (transient ischemic attack) 03-12-11    Past Surgical History:  Procedure Laterality Date  . OPEN REDUCTION INTERNAL FIXATION (ORIF) DISTAL RADIAL FRACTURE Left 01/28/2016   Procedure: OPEN REDUCTION INTERNAL FIXATION (ORIF) DISTAL RADIAL FRACTURE;  Surgeon: Iran Planas, MD;  Location: [place];  Service: Orthopedics;  Laterality: Left;  . [place]     age 56    Social History   Social History  . Marital status: Single    Spouse name: N/A  . Number of children: N/A  . Years of education: N/A   Occupational History  . Not on file.   Social History Main Topics  . Smoking status: Current Every Day Smoker    Packs/day: 1.00    Years: 32.00    Types: Cigarettes    Last attempt to quit: 09/23/2012  . Smokeless tobacco: Never Used     Comment: or less  . Alcohol use No  . Drug use: No  . Sexual activity: No   Other Topics Concern  . Not on file   Social History Narrative   Lives with brother and sister-in-law. Does not work.         Family History  Problem Relation Age of Onset  . Stroke Mother   . Diabetes Mother   . Kidney failure Mother   . Heart failure Mother   . Stroke Father   . Cancer Sister     Breast- 50's    VITAL SIGNS BP  128/78   Pulse 70   Resp 18   Ht 4\' 6"  (1.372 m)   Wt 83 lb (37.6 kg)   LMP 03/06/2013   BMI 20.01 kg/m   Patient's Medications  New Prescriptions   No medications on file  Previous Medications   ASPIRIN 81 MG TABLET    Take 1 tablet (81 mg total) by mouth daily.   ATORVASTATIN (LIPITOR) 20 MG TABLET    Take 1 tablet (20 mg total) by mouth daily.   CALCIUM CARBONATE (TUMS - DOSED IN MG ELEMENTAL CALCIUM) 500 MG CHEWABLE TABLET    Chew 2 tablets (400 mg of elemental calcium total) by mouth 2 (two) times daily with a meal.   INSULIN ASPART PROTAMINE- ASPART (NOVOLOG MIX 70/30) (70-30) 100 UNIT/ML INJECTION    Inject 0.2 mLs (20 Units total) into the skin 2 (two) times daily with a meal.   METOPROLOL (LOPRESSOR) 50 MG TABLET    Take 1 tablet (50 mg total) by mouth 2 (two) times daily.   MULTIPLE VITAMIN (MULTIVITAMIN) TABLET    Take 1 tablet by mouth daily.     SODIUM BICARBONATE 650 MG TABLET    Take  1 tablet (650 mg total) by mouth 2 (two) times daily.  Modified Medications   No medications on file  Discontinued Medications   No medications on file     SIGNIFICANT DIAGNOSTIC EXAMS   06-11-16: ct of abdomen and pelvis: 1. Kidneys appear an edematous bilaterally and there is moderate perinephric fat stranding, findings are nonspecific but could be due to medical renal disease or possible pyelonephritis, clinical correlation recommended. Multiple punctate stones within the bilateral kidneys without ureteral stone identified. 2. Multifocal areas of mild wall thickening involving the colon. There are fluid-filled loops of small bowel with suggested areas of mild wall thickening, findings could relate to enteritis/colitis of infectious or inflammatory etiology. There is no evidence for a bowel obstruction. The appendix is normal.  06-12-16: chest x-ray: 1. No acute consolidation or effusion 2. Minimal linear peripheral opacities on the right could relate tovatelectasis.  06-12-16: ct of  abdomen and pelvis: 1. Scattered areas of wall thickening seen in small bowel and colon on the previous study resolved in the interval and may well within related to underdistention previously. 2. Both kidneys remain enlarged and edematous with perinephric edema/inflammation. No hydroureteronephrosis. Tiny bilateral nonobstructing stones are again noted. 3. Dystrophic calcification identified in the region of the uncinate process/mesenteric root. This may be related to focal parenchymal calcification in the pancreas or dystrophic calcification within central abdominal lymph nodes. Follow-up evaluation with intravenous contrast could be used when renal function normalizes. 4. New bibasilar atelectasis with tiny bilateral pleural effusions. 5.  Abdominal Aortic Atherosclerois (ICD10-170.0)   06-17-16: 2-d echo: - Left ventricle: The cavity size was normal. Systolic function was vigorous. The estimated ejection fraction was in the range of 65% to 70%. Wall motion was normal; there were no regional wall motion abnormalities. Features are consistent with a pseudonormal left ventricular filling pattern, with concomitant abnormal relaxation and increased filling pressure (grade 2 diastolic dysfunction). Doppler parameters are consistent with indeterminate ventricular filling pressure. - Aortic valve: Transvalvular velocity was within the normal range.  There was no stenosis. There was no regurgitation. - Mitral valve: Transvalvular velocity was within the normal range. There was no evidence for stenosis. There was mild regurgitation. - Right ventricle: The cavity size was normal. Wall thickness was normal. Systolic function was normal. - Atrial septum: No defect or patent foramen ovale was identified by color flow Doppler. - Tricuspid valve: There was trivial regurgitation. - Pulmonary arteries: Systolic pressure was within the normal range. PA peak pressure: 24 mm Hg (S).   06-27-15; renal ultrasound: No  focal renal abnormality identified. No evidence of renal or perirenal abscess. No evidence of hydronephrosis .  06-27-15: ct of abdomen and pelvis: 1. No acute infiltrate or effusion in the chest. 2. Edematous enlarged kidneys suggests ongoing inflammation or infection. Persistent moderate perinephric fat stranding. No gross hydronephrosis or hydroureter. Multiple punctate intrarenal calculi again visualized. 3. Small calcified gallstone is difficult to appreciate on current exam. The gallbladder is contracted.   LABS REVIEWED:   02-06-16: chol 253; trig 553; hdl 36 04-29-16: hgb a1c 10.9  06-11-16: wbc 27.3; hgb 7.6; hct 22.4; mcv 78.6 ;plt 566; glucose 279; bun 43; creat 2.49; k+ 2.1; na++ 131; ca 6.6; liver normal albumin 2.0; mag 1.1; phos 2.3; tsh 1.033; urine culture: e-coli 06-12-16; wbc 22.;4 hbg 7.0; hct 20.4; mcv 78.8;plt 480; glucose 107; bun 37; creat 1.97; k+ 2.9; na++ 139; ca 6.0; liver normal albumin 1.7; vit B 12: 192; folate 11.0; iron 6; tibc 174; ferritin  373; blood culture: e-coli; enterobacteriaceae species  06-14-16: glucose 254; bun 42; creat 2.52; k+ 3.6; na++ 141; ca 6.2 06-17-16: wbc 19.9; hgb 11.5; hct 34.0; mcv 79.4; plt 301; glucose 347; bun 48; creat 2.74; k+ 3.1; na++ 136; ca 7.8; mag 1.6 06-20-16: blood culture: no growth  06-22-16: wbc 20.6; hgb 11.1; hct 34.5 ;mcv 83.9; plt 453; glucose 256; bun 68; creat 2.58; k+ 5.5; na++ 134; ca 8.3;  06-28-15: vit D 32.9; ionized ca 4.8; PTH 23; blood and urine cultures: no growth 06-29-15: EPO 15.3; sed rate 110; CRP 9.0 06-30-15: wbc 14.8; hgb 9.2; hct 29.0; mcv 85.3; plt 555; glucose 178; bun 75; creat 2.37; k+ 4.6;na++ 136; ca 8.6 vit B 12: 273; folate 38; iron 24; tibc 255; ferritin 1102     Review of Systems  Constitutional: Negative for malaise/fatigue.  Respiratory:  Negative for shortness of breath cough.   Cardiovascular: Negative for chest pain and leg swelling.  Gastrointestinal: Negative for abdominal pain,  constipation and heartburn.  Musculoskeletal: Negative for back pain, joint pain and myalgias.  Skin: Negative.   Neurological: Negative for dizziness.  Psychiatric/Behavioral: The patient is not nervous/anxious.     Physical Exam  Constitutional: No distress.  Eyes: Conjunctivae are normal.  Neck: Neck supple. No JVD present. No thyromegaly present.  Cardiovascular: Normal rate, regular rhythm and intact distal pulses.   Respiratory: Effort normal. No respiratory distress. Lung sounds clear    GI: Soft. Bowel sounds are normal. She exhibits no distension. There is no tenderness.  Musculoskeletal: She exhibits no edema.  Able to move all extremities   Lymphadenopathy:    She has no cervical adenopathy.  Neurological: She is alert.  Skin: Skin is warm and dry. She is not diaphoretic.  Psychiatric: She has a normal mood and affect.    ASSESSMENT/ PLAN:  Patient is being discharged with the following home health services:  Pt/ot to evaluate and treat as indicated for gait balance strength adl training   Patient is being discharged with the following durable medical equipment:  Will utilize cane.   Patient has been advised to f/u with their PCP in 1-2 weeks to bring them up to date on their rehab stay.  Social services at facility was responsible for arranging this appointment.  Pt was provided with a 30 day supply of prescriptions for medications and refills must be obtained from their PCP.  For controlled substances, a more limited supply may be provided adequate until PCP appointment only.   Time spent with patient  40  minutes >50% time spent counseling; reviewing medical record; tests; labs; and developing future plan of care    Ok Edwards NP [facility]  Contact [phone] Monday through Friday 8am- 5pm  After hours call [phone]

## 2016-08-11 ENCOUNTER — Ambulatory Visit: Payer: Self-pay | Admitting: Family Medicine

## 2016-08-19 ENCOUNTER — Non-Acute Institutional Stay (SKILLED_NURSING_FACILITY): Payer: Medicaid Other | Admitting: Adult Health

## 2016-08-19 ENCOUNTER — Encounter: Payer: Self-pay | Admitting: Adult Health

## 2016-08-19 DIAGNOSIS — I639 Cerebral infarction, unspecified: Secondary | ICD-10-CM | POA: Diagnosis not present

## 2016-08-19 DIAGNOSIS — I5032 Chronic diastolic (congestive) heart failure: Secondary | ICD-10-CM

## 2016-08-19 DIAGNOSIS — E872 Acidosis, unspecified: Secondary | ICD-10-CM | POA: Insufficient documentation

## 2016-08-19 DIAGNOSIS — E1129 Type 2 diabetes mellitus with other diabetic kidney complication: Secondary | ICD-10-CM

## 2016-08-19 DIAGNOSIS — E1165 Type 2 diabetes mellitus with hyperglycemia: Secondary | ICD-10-CM | POA: Diagnosis not present

## 2016-08-19 DIAGNOSIS — E782 Mixed hyperlipidemia: Secondary | ICD-10-CM

## 2016-08-19 DIAGNOSIS — I10 Essential (primary) hypertension: Secondary | ICD-10-CM

## 2016-08-19 NOTE — Progress Notes (Signed)
Location:   [facility] Room Number: 122 A Place of Service:  SNF (31)   CODE STATUS: DNR  Allergies  Allergen Reactions  . Hydrocodone Nausea And Vomiting    Chief Complaint  Patient presents with  . Medical Management of Chronic Issues    Routine Visit    HPI:  She is a resident of this facility being seen for the management of her chronic illnesses. She has not yet been discharged due to housing issues. She is not voicing any complaints at this time. There are no nursing concerns at this time.    Past Medical History:  Diagnosis Date  . Diabetes mellitus   . Hyperlipidemia   . Hypertension   . Stroke [facility]) 04-01-11   left frontal subcortical, saw Dr. [name]   . TIA (transient ischemic attack) 03-12-11    Past Surgical History:  Procedure Laterality Date  . OPEN REDUCTION INTERNAL FIXATION (ORIF) DISTAL RADIAL FRACTURE Left 01/28/2016   Procedure: OPEN REDUCTION INTERNAL FIXATION (ORIF) DISTAL RADIAL FRACTURE;  Surgeon: Iran Planas, MD;  Location: [facility];  Service: Orthopedics;  Laterality: Left;  . [place]     age 20    Social History   Social History  . Marital status: Single    Spouse name: N/A  . Number of children: N/A  . Years of education: N/A   Occupational History  . Not on file.   Social History Main Topics  . Smoking status: Current Every Day Smoker    Packs/day: 1.00    Years: 32.00    Types: Cigarettes    Last attempt to quit: 09/23/2012  . Smokeless tobacco: Never Used     Comment: or less  . Alcohol use No  . Drug use: No  . Sexual activity: No   Other Topics Concern  . Not on file   Social History Narrative   Lives with brother and sister-in-law. Does not work.         Family History  Problem Relation Age of Onset  . Stroke Mother   . Diabetes Mother   . Kidney failure Mother   . Heart failure Mother   . Stroke Father   . Cancer Sister    Breast- 50's      VITAL SIGNS BP 128/66   Pulse 72   Temp 98.1 F (36.7 C)   Resp 18   LMP 03/06/2013   SpO2 98%   Patient's Medications  New Prescriptions   No medications on file  Previous Medications   ASPIRIN 81 MG TABLET    Take 1 tablet (81 mg total) by mouth daily.   ATORVASTATIN (LIPITOR) 20 MG TABLET    Take 1 tablet (20 mg total) by mouth daily.   CALCIUM CARBONATE (TUMS - DOSED IN MG ELEMENTAL CALCIUM) 500 MG CHEWABLE TABLET    Chew 2 tablets (400 mg of elemental calcium total) by mouth 2 (two) times daily with a meal.   INSULIN ASPART PROTAMINE- ASPART (NOVOLOG MIX 70/30) (70-30) 100 UNIT/ML INJECTION    Inject 0.2 mLs (20 Units total) into the skin 2 (two) times daily with a meal.   METOPROLOL (LOPRESSOR) 50 MG TABLET    Take 1 tablet (50 mg total) by mouth 2 (two) times daily.   MULTIPLE VITAMIN (MULTIVITAMIN) TABLET    Take 1 tablet by mouth daily.     NYSTATIN POWD    by Does not apply  route. Apply 1 application transdermal two times a day for preineal irritation   SODIUM BICARBONATE 650 MG TABLET    Take 1 tablet (650 mg total) by mouth 2 (two) times daily.   UNABLE TO FIND    House supplements two times a day for weight support  Modified Medications   No medications on file  Discontinued Medications   No medications on file     SIGNIFICANT DIAGNOSTIC EXAMS  06-11-16: ct of abdomen and pelvis: 1. Kidneys appear an edematous bilaterally and there is moderate perinephric fat stranding, findings are nonspecific but could be due to medical renal disease or possible pyelonephritis, clinical correlation recommended. Multiple punctate stones within the bilateral kidneys without ureteral stone identified. 2. Multifocal areas of mild wall thickening involving the colon. There are fluid-filled loops of small bowel with suggested areas of mild wall thickening, findings could relate to enteritis/colitis of infectious or inflammatory etiology. There is no evidence for a bowel  obstruction. The appendix is normal.  06-12-16: chest x-ray: 1. No acute consolidation or effusion 2. Minimal linear peripheral opacities on the right could relate tovatelectasis.  06-12-16: ct of abdomen and pelvis: 1. Scattered areas of wall thickening seen in small bowel and colon on the previous study resolved in the interval and may well within related to underdistention previously. 2. Both kidneys remain enlarged and edematous with perinephric edema/inflammation. No hydroureteronephrosis. Tiny bilateral nonobstructing stones are again noted. 3. Dystrophic calcification identified in the region of the uncinate process/mesenteric root. This may be related to focal parenchymal calcification in the pancreas or dystrophic calcification within central abdominal lymph nodes. Follow-up evaluation with intravenous contrast could be used when renal function normalizes. 4. New bibasilar atelectasis with tiny bilateral pleural effusions. 5.  Abdominal Aortic Atherosclerois (ICD10-170.0)   06-17-16: 2-d echo: - Left ventricle: The cavity size was normal. Systolic function was vigorous. The estimated ejection fraction was in the range of 65% to 70%. Wall motion was normal; there were no regional wall motion abnormalities. Features are consistent with a pseudonormal left ventricular filling pattern, with concomitant abnormal relaxation and increased filling pressure (grade 2 diastolic dysfunction). Doppler parameters are consistent with indeterminate ventricular filling pressure. - Aortic valve: Transvalvular velocity was within the normal range.  There was no stenosis. There was no regurgitation. - Mitral valve: Transvalvular velocity was within the normal range. There was no evidence for stenosis. There was mild regurgitation. - Right ventricle: The cavity size was normal. Wall thickness was normal. Systolic function was normal. - Atrial septum: No defect or patent foramen ovale was identified by color flow  Doppler. - Tricuspid valve: There was trivial regurgitation. - Pulmonary arteries: Systolic pressure was within the normal range. PA peak pressure: 24 mm Hg (S).   06-27-15; renal ultrasound: No focal renal abnormality identified. No evidence of renal or perirenal abscess. No evidence of hydronephrosis .  06-27-15: ct of abdomen and pelvis: 1. No acute infiltrate or effusion in the chest. 2. Edematous enlarged kidneys suggests ongoing inflammation or infection. Persistent moderate perinephric fat stranding. No gross hydronephrosis or hydroureter. Multiple punctate intrarenal calculi again visualized. 3. Small calcified gallstone is difficult to appreciate on current exam. The gallbladder is contracted.   LABS REVIEWED:   02-06-16: chol 253; trig 553; hdl 36 04-29-16: hgb a1c 10.9  06-11-16: wbc 27.3; hgb 7.6; hct 22.4; mcv 78.6 ;plt 566; glucose 279; bun 43; creat 2.49; k+ 2.1; na++ 131; ca 6.6; liver normal albumin 2.0; mag 1.1; phos 2.3; tsh 1.033; urine  culture: e-coli 06-12-16; wbc 22.;4 hbg 7.0; hct 20.4; mcv 78.8;plt 480; glucose 107; bun 37; creat 1.97; k+ 2.9; na++ 139; ca 6.0; liver normal albumin 1.7; vit B 12: 192; folate 11.0; iron 6; tibc 174; ferritin 373; blood culture: e-coli; enterobacteriaceae species  06-14-16: glucose 254; bun 42; creat 2.52; k+ 3.6; na++ 141; ca 6.2 06-17-16: wbc 19.9; hgb 11.5; hct 34.0; mcv 79.4; plt 301; glucose 347; bun 48; creat 2.74; k+ 3.1; na++ 136; ca 7.8; mag 1.6 06-20-16: blood culture: no growth  06-22-16: wbc 20.6; hgb 11.1; hct 34.5 ;mcv 83.9; plt 453; glucose 256; bun 68; creat 2.58; k+ 5.5; na++ 134; ca 8.3;  06-27-16: vit D 32.9; ionized ca 4.8; PTH 23; blood and urine cultures: no growth 06-28-16: EPO 15.3; sed rate 110; CRP 9.0 06-29-16: wbc 14.8; hgb 9.2; hct 29.0; mcv 85.3; plt 555; glucose 178; bun 75; creat 2.37; k+ 4.6;na++ 136; ca 8.6 vit B 12: 273; folate 38; iron 24; tibc 255; ferritin 1102     Review of Systems  Constitutional: Negative  for malaise/fatigue.  Respiratory:  Negative for shortness of breath cough.   Cardiovascular: Negative for chest pain and leg swelling.  Gastrointestinal: Negative for abdominal pain, constipation and heartburn.  Musculoskeletal: Negative for back pain, joint pain and myalgias.  Skin: Negative.   Neurological: Negative for dizziness.  Psychiatric/Behavioral: The patient is not nervous/anxious.     Physical Exam  Constitutional: No distress.  Eyes: Conjunctivae are normal.  Neck: Neck supple. No JVD present. No thyromegaly present.  Cardiovascular: Normal rate, regular rhythm and intact distal pulses.   Respiratory: Effort normal. No respiratory distress. Lung sounds clear    GI: Soft. Bowel sounds are normal. She exhibits no distension. There is no tenderness.  Musculoskeletal: She exhibits no edema.  Able to move all extremities   Lymphadenopathy:    She has no cervical adenopathy.  Neurological: She is alert.  Skin: Skin is warm and dry. She is not diaphoretic.  Psychiatric: She has a normal mood and affect.    ASSESSMENT/ PLAN:  1.  Chronic diastolic heart failure: EF 65-70% (06-17-16): is not on diuretic will continue lopressor 50 mg twice daily   2. Hypertension: will continue lopressor 50 mg twice daily  Asa 81 mg daily   3.  Dyslipidemia: chol 253 trig 553; will continue lipitor 20 mg daily   4. Diabetes: hgb a1c 10.9; has diabetic proliferative retinopathy  will stop novolog mix and will begin lantus 30 units nightly with novolog 10 units after meals.   5.  Renal failure with metabolic acidosis:  bun/creat: 75/2.37   Will continue sodium bicarb 650 mg twice daily will monitor   6.  Hypocalcemia: ca++ 8. 6  will continue calcium 1 gm three times daily   7. CVA (2012): is neurologically stable will continue asa 81 mg daily      Ok Edwards NP [facility]  Contact [phone] Monday through Friday 8am- 5pm  After hours call [phone]

## 2016-09-05 ENCOUNTER — Encounter: Payer: Self-pay | Admitting: Adult Health

## 2016-09-05 ENCOUNTER — Non-Acute Institutional Stay (SKILLED_NURSING_FACILITY): Payer: Medicaid Other | Admitting: Adult Health

## 2016-09-05 DIAGNOSIS — E1129 Type 2 diabetes mellitus with other diabetic kidney complication: Secondary | ICD-10-CM | POA: Diagnosis not present

## 2016-09-05 DIAGNOSIS — I5032 Chronic diastolic (congestive) heart failure: Secondary | ICD-10-CM | POA: Diagnosis not present

## 2016-09-05 DIAGNOSIS — I10 Essential (primary) hypertension: Secondary | ICD-10-CM | POA: Diagnosis not present

## 2016-09-05 DIAGNOSIS — K529 Noninfective gastroenteritis and colitis, unspecified: Secondary | ICD-10-CM | POA: Diagnosis not present

## 2016-09-05 DIAGNOSIS — E1165 Type 2 diabetes mellitus with hyperglycemia: Secondary | ICD-10-CM | POA: Diagnosis not present

## 2016-09-05 NOTE — Progress Notes (Signed)
Location:   [facility] Room Number: 122 A Place of Service:  SNF (31)    CODE STATUS: DNR  Allergies  Allergen Reactions  . Hydrocodone Nausea And Vomiting    Chief Complaint  Patient presents with  . Discharge Note    Dishcharge    HPI:  She is being discharged to assisted living. She will not need dme or home health health services. She will need her prescriptions written and will need to follow up with the medical provider at the receiving facility.  She had been hospitalized for pyelonephritis; colitis anemia. She is ready for discharge to assisted living.     Past Medical History:  Diagnosis Date  . Diabetes mellitus   . Hyperlipidemia   . Hypertension   . Stroke [facility]) 04-01-11   left frontal subcortical, saw Dr. [name]   . TIA (transient ischemic attack) 03-12-11    Past Surgical History:  Procedure Laterality Date  . OPEN REDUCTION INTERNAL FIXATION (ORIF) DISTAL RADIAL FRACTURE Left 01/28/2016   Procedure: OPEN REDUCTION INTERNAL FIXATION (ORIF) DISTAL RADIAL FRACTURE;  Surgeon: Iran Planas, MD;  Location: [facility];  Service: Orthopedics;  Laterality: Left;  . [place]     age 56    Social History   Social History  . Marital status: Single    Spouse name: N/A  . Number of children: N/A  . Years of education: N/A   Occupational History  . Not on file.   Social History Main Topics  . Smoking status: Current Every Day Smoker    Packs/day: 1.00    Years: 32.00    Types: Cigarettes    Last attempt to quit: 09/23/2012  . Smokeless tobacco: Never Used     Comment: or less  . Alcohol use No  . Drug use: No  . Sexual activity: No   Other Topics Concern  . Not on file   Social History Narrative   Lives with brother and sister-in-law. Does not work.         Family History  Problem Relation Age of Onset  . Stroke Mother   . Diabetes Mother   . Kidney failure Mother     . Heart failure Mother   . Stroke Father   . Cancer Sister     Breast- 50's    VITAL SIGNS BP (!) 158/88   Pulse 95   Temp 98.1 F (36.7 C)   Resp 18   Ht 4\' 6"  (1.372 m)   Wt 84 lb (38.1 kg)   LMP 03/06/2013   SpO2 98%   BMI 20.25 kg/m   Patient's Medications  New Prescriptions   No medications on file  Previous Medications   ASPIRIN 81 MG TABLET    Take 1 tablet (81 mg total) by mouth daily.   ATORVASTATIN (LIPITOR) 20 MG TABLET    Take 1 tablet (20 mg total) by mouth daily.   CALCIUM CARBONATE (TUMS - DOSED IN MG ELEMENTAL CALCIUM) 500 MG CHEWABLE TABLET    Chew 2 tablets (400 mg of elemental calcium total) by mouth 2 (two) times daily with a meal.   INSULIN ASPART PROTAMINE- ASPART (NOVOLOG MIX 70/30) (70-30) 100 UNIT/ML INJECTION    Inject 0.2 mLs (20 Units total) into the skin 2 (two) times daily with a meal.   METOPROLOL (LOPRESSOR) 50 MG TABLET    Take 1 tablet (50 mg total) by mouth 2 (two)  times daily.   MULTIPLE VITAMIN (MULTIVITAMIN) TABLET    Take 1 tablet by mouth daily.     NYSTATIN POWD    by Does not apply route. Apply 1 application transdermal two times a day for preineal irritation   SODIUM BICARBONATE 650 MG TABLET    Take 1 tablet (650 mg total) by mouth 2 (two) times daily.   UNABLE TO FIND    House supplements two times a day for weight support  Modified Medications   No medications on file  Discontinued Medications   No medications on file     SIGNIFICANT DIAGNOSTIC EXAMS  06-11-16: ct of abdomen and pelvis: 1. Kidneys appear an edematous bilaterally and there is moderate perinephric fat stranding, findings are nonspecific but could be due to medical renal disease or possible pyelonephritis, clinical correlation recommended. Multiple punctate stones within the bilateral kidneys without ureteral stone identified. 2. Multifocal areas of mild wall thickening involving the colon. There are fluid-filled loops of small bowel with suggested areas of mild  wall thickening, findings could relate to enteritis/colitis of infectious or inflammatory etiology. There is no evidence for a bowel obstruction. The appendix is normal.  06-12-16: chest x-ray: 1. No acute consolidation or effusion 2. Minimal linear peripheral opacities on the right could relate tovatelectasis.  06-12-16: ct of abdomen and pelvis: 1. Scattered areas of wall thickening seen in small bowel and colon on the previous study resolved in the interval and may well within related to underdistention previously. 2. Both kidneys remain enlarged and edematous with perinephric edema/inflammation. No hydroureteronephrosis. Tiny bilateral nonobstructing stones are again noted. 3. Dystrophic calcification identified in the region of the uncinate process/mesenteric root. This may be related to focal parenchymal calcification in the pancreas or dystrophic calcification within central abdominal lymph nodes. Follow-up evaluation with intravenous contrast could be used when renal function normalizes. 4. New bibasilar atelectasis with tiny bilateral pleural effusions. 5.  Abdominal Aortic Atherosclerois (ICD10-170.0)   06-17-16: 2-d echo: - Left ventricle: The cavity size was normal. Systolic function was vigorous. The estimated ejection fraction was in the range of 65% to 70%. Wall motion was normal; there were no regional wall motion abnormalities. Features are consistent with a pseudonormal left ventricular filling pattern, with concomitant abnormal relaxation and increased filling pressure (grade 2 diastolic dysfunction). Doppler parameters are consistent with indeterminate ventricular filling pressure. - Aortic valve: Transvalvular velocity was within the normal range.  There was no stenosis. There was no regurgitation. - Mitral valve: Transvalvular velocity was within the normal range. There was no evidence for stenosis. There was mild regurgitation. - Right ventricle: The cavity size was normal. Wall  thickness was normal. Systolic function was normal. - Atrial septum: No defect or patent foramen ovale was identified by color flow Doppler. - Tricuspid valve: There was trivial regurgitation. - Pulmonary arteries: Systolic pressure was within the normal range. PA peak pressure: 24 mm Hg (S).   06-27-15; renal ultrasound: No focal renal abnormality identified. No evidence of renal or perirenal abscess. No evidence of hydronephrosis .  06-27-15: ct of abdomen and pelvis: 1. No acute infiltrate or effusion in the chest. 2. Edematous enlarged kidneys suggests ongoing inflammation or infection. Persistent moderate perinephric fat stranding. No gross hydronephrosis or hydroureter. Multiple punctate intrarenal calculi again visualized. 3. Small calcified gallstone is difficult to appreciate on current exam. The gallbladder is contracted.   LABS REVIEWED:   02-06-16: chol 253; trig 553; hdl 36 04-29-16: hgb a1c 10.9  06-11-16: wbc 27.3; hgb  7.6; hct 22.4; mcv 78.6 ;plt 566; glucose 279; bun 43; creat 2.49; k+ 2.1; na++ 131; ca 6.6; liver normal albumin 2.0; mag 1.1; phos 2.3; tsh 1.033; urine culture: e-coli 06-12-16; wbc 22.;4 hbg 7.0; hct 20.4; mcv 78.8;plt 480; glucose 107; bun 37; creat 1.97; k+ 2.9; na++ 139; ca 6.0; liver normal albumin 1.7; vit B 12: 192; folate 11.0; iron 6; tibc 174; ferritin 373; blood culture: e-coli; enterobacteriaceae species  06-14-16: glucose 254; bun 42; creat 2.52; k+ 3.6; na++ 141; ca 6.2 06-17-16: wbc 19.9; hgb 11.5; hct 34.0; mcv 79.4; plt 301; glucose 347; bun 48; creat 2.74; k+ 3.1; na++ 136; ca 7.8; mag 1.6 06-20-16: blood culture: no growth  06-22-16: wbc 20.6; hgb 11.1; hct 34.5 ;mcv 83.9; plt 453; glucose 256; bun 68; creat 2.58; k+ 5.5; na++ 134; ca 8.3;  06-27-16: vit D 32.9; ionized ca 4.8; PTH 23; blood and urine cultures: no growth 06-28-16: EPO 15.3; sed rate 110; CRP 9.0 06-29-16: wbc 14.8; hgb 9.2; hct 29.0; mcv 85.3; plt 555; glucose 178; bun 75; creat 2.37;  k+ 4.6;na++ 136; ca 8.6 vit B 12: 273; folate 38; iron 24; tibc 255; ferritin 1102     Review of Systems  Constitutional: Negative for malaise/fatigue.  Respiratory:  Negative for shortness of breath cough.   Cardiovascular: Negative for chest pain and leg swelling.  Gastrointestinal: Negative for abdominal pain, constipation and heartburn.  Musculoskeletal: Negative for back pain, joint pain and myalgias.  Skin: Negative.   Neurological: Negative for dizziness.  Psychiatric/Behavioral: The patient is not nervous/anxious.     Physical Exam  Constitutional: No distress.  Eyes: Conjunctivae are normal.  Neck: Neck supple. No JVD present. No thyromegaly present.  Cardiovascular: Normal rate, regular rhythm and intact distal pulses.   Respiratory: Effort normal. No respiratory distress. Lung sounds clear    GI: Soft. Bowel sounds are normal. She exhibits no distension. There is no tenderness.  Musculoskeletal: She exhibits no edema.  Able to move all extremities   Lymphadenopathy:    She has no cervical adenopathy.  Neurological: She is alert.  Skin: Skin is warm and dry. She is not diaphoretic.  Psychiatric: She has a normal mood and affect.    ASSESSMENT/ PLAN:  Patient is being discharged with the following home health services:  None required   Patient is being discharged with the following durable medical equipment:  None required   Patient has been advised to f/u with their PCP in 1-2 weeks to bring them up to date on their rehab stay.  Social services at facility was responsible for arranging this appointment.  Pt was provided with a 30 day supply of prescriptions for medications and refills must be obtained from their PCP.  For controlled substances, a more limited supply may be provided adequate until PCP appointment only.    Time spent with patient  35  minutes >50% time spent counseling; reviewing medical record; tests; labs; and developing future plan of  care    Ok Edwards NP [facility]  Contact [phone] Monday through Friday 8am- 5pm  After hours call [phone]

## 2016-09-16 DIAGNOSIS — I130 Hypertensive heart and chronic kidney disease with heart failure and stage 1 through stage 4 chronic kidney disease, or unspecified chronic kidney disease: Secondary | ICD-10-CM | POA: Diagnosis not present

## 2016-09-16 DIAGNOSIS — I5042 Chronic combined systolic (congestive) and diastolic (congestive) heart failure: Secondary | ICD-10-CM | POA: Diagnosis not present

## 2016-09-16 DIAGNOSIS — N189 Chronic kidney disease, unspecified: Secondary | ICD-10-CM | POA: Diagnosis not present

## 2016-09-16 DIAGNOSIS — E1122 Type 2 diabetes mellitus with diabetic chronic kidney disease: Secondary | ICD-10-CM | POA: Diagnosis not present

## 2016-10-14 ENCOUNTER — Encounter (HOSPITAL_COMMUNITY): Payer: Self-pay | Admitting: Emergency Medicine

## 2016-10-14 ENCOUNTER — Emergency Department (HOSPITAL_COMMUNITY): Payer: Medicaid Other

## 2016-10-14 ENCOUNTER — Emergency Department (HOSPITAL_COMMUNITY)
Admission: EM | Admit: 2016-10-14 | Discharge: 2016-10-14 | Disposition: A | Discharge status: Home / Self Care | Payer: Medicaid Other | Attending: Physician Assistant | Admitting: Physician Assistant

## 2016-10-14 DIAGNOSIS — K219 Gastro-esophageal reflux disease without esophagitis: Secondary | ICD-10-CM | POA: Diagnosis not present

## 2016-10-14 DIAGNOSIS — K824 Cholesterolosis of gallbladder: Secondary | ICD-10-CM

## 2016-10-14 DIAGNOSIS — D72829 Elevated white blood cell count, unspecified: Secondary | ICD-10-CM

## 2016-10-14 DIAGNOSIS — Z7982 Long term (current) use of aspirin: Secondary | ICD-10-CM | POA: Insufficient documentation

## 2016-10-14 DIAGNOSIS — Z8673 Personal history of transient ischemic attack (TIA), and cerebral infarction without residual deficits: Secondary | ICD-10-CM | POA: Diagnosis not present

## 2016-10-14 DIAGNOSIS — E1165 Type 2 diabetes mellitus with hyperglycemia: Secondary | ICD-10-CM | POA: Diagnosis present

## 2016-10-14 DIAGNOSIS — R112 Nausea with vomiting, unspecified: Secondary | ICD-10-CM

## 2016-10-14 DIAGNOSIS — Z794 Long term (current) use of insulin: Secondary | ICD-10-CM

## 2016-10-14 DIAGNOSIS — I5032 Chronic diastolic (congestive) heart failure: Secondary | ICD-10-CM | POA: Insufficient documentation

## 2016-10-14 DIAGNOSIS — F17210 Nicotine dependence, cigarettes, uncomplicated: Secondary | ICD-10-CM | POA: Insufficient documentation

## 2016-10-14 DIAGNOSIS — K2970 Gastritis, unspecified, without bleeding: Secondary | ICD-10-CM | POA: Diagnosis not present

## 2016-10-14 DIAGNOSIS — Z720 Tobacco use: Secondary | ICD-10-CM

## 2016-10-14 DIAGNOSIS — N390 Urinary tract infection, site not specified: Secondary | ICD-10-CM | POA: Insufficient documentation

## 2016-10-14 DIAGNOSIS — I110 Hypertensive heart disease with heart failure: Secondary | ICD-10-CM | POA: Diagnosis not present

## 2016-10-14 DIAGNOSIS — R1013 Epigastric pain: Secondary | ICD-10-CM

## 2016-10-14 LAB — BASIC METABOLIC PANEL
ANION GAP: 13 (ref 5–15)
BUN: 50 mg/dL — ABNORMAL HIGH (ref 6–20)
CALCIUM: 12.3 mg/dL — AB (ref 8.9–10.3)
CO2: 30 mmol/L (ref 22–32)
CREATININE: 2.29 mg/dL — AB (ref 0.44–1.00)
Chloride: 90 mmol/L — ABNORMAL LOW (ref 101–111)
GFR, EST AFRICAN AMERICAN: 26 mL/min — AB (ref >60)
GFR, EST NON AFRICAN AMERICAN: 23 mL/min — AB (ref >60)
Glucose, Bld: 365 mg/dL — ABNORMAL HIGH (ref 65–99)
Potassium: 4.1 mmol/L (ref 3.5–5.1)
SODIUM: 133 mmol/L — AB (ref 135–145)

## 2016-10-14 LAB — CBC
HCT: 35.6 % — ABNORMAL LOW (ref 36.0–46.0)
HEMOGLOBIN: 12.3 g/dL (ref 12.0–15.0)
MCH: 29.9 pg (ref 26.0–34.0)
MCHC: 34.6 g/dL (ref 30.0–36.0)
MCV: 86.4 fL (ref 78.0–100.0)
PLATELETS: 362 10*3/uL (ref 150–400)
RBC: 4.12 MIL/uL (ref 3.87–5.11)
RDW: 12.9 % (ref 11.5–15.5)
WBC: 10.9 10*3/uL — AB (ref 4.0–10.5)

## 2016-10-14 LAB — HEPATIC FUNCTION PANEL
ALBUMIN: 4.1 g/dL (ref 3.5–5.0)
ALK PHOS: 52 U/L (ref 38–126)
ALT: 21 U/L (ref 14–54)
AST: 20 U/L (ref 15–41)
Bilirubin, Direct: <0.1 mg/dL — ABNORMAL LOW (ref 0.1–0.5)
Total Bilirubin: 0.5 mg/dL (ref 0.3–1.2)
Total Protein: 8.3 g/dL — ABNORMAL HIGH (ref 6.5–8.1)

## 2016-10-14 LAB — I-STAT TROPONIN, ED: TROPONIN I, POC: 0 ng/mL (ref 0.00–0.08)

## 2016-10-14 LAB — URINALYSIS, ROUTINE W REFLEX MICROSCOPIC
BILIRUBIN URINE: NEGATIVE
Ketones, ur: NEGATIVE mg/dL
NITRITE: NEGATIVE
PROTEIN: NEGATIVE mg/dL
Specific Gravity, Urine: 1.003 — ABNORMAL LOW (ref 1.005–1.030)
pH: 8 (ref 5.0–8.0)

## 2016-10-14 LAB — CBG MONITORING, ED: Glucose-Capillary: 360 mg/dL — ABNORMAL HIGH (ref 65–99)

## 2016-10-14 LAB — LIPASE, BLOOD: Lipase: 34 U/L (ref 11–51)

## 2016-10-14 MED ORDER — ONDANSETRON HCL 4 MG/2ML IJ SOLN
4.0000 mg | Freq: Once | INTRAMUSCULAR | Status: AC
  Administered 2016-10-14: 4 mg via INTRAVENOUS
  Filled 2016-10-14: qty 2

## 2016-10-14 MED ORDER — SODIUM CHLORIDE 0.9 % IV BOLUS (SEPSIS)
1000.0000 mL | Freq: Once | INTRAVENOUS | Status: AC
  Administered 2016-10-14: 1000 mL via INTRAVENOUS

## 2016-10-14 MED ORDER — GI COCKTAIL ~~LOC~~
30.0000 mL | Freq: Once | ORAL | Status: AC
  Administered 2016-10-14: 30 mL via ORAL
  Filled 2016-10-14: qty 30

## 2016-10-14 MED ORDER — RANITIDINE HCL 150 MG PO TABS: 150.0000 mg | ORAL_TABLET | Freq: Two times a day (BID) | ORAL | 0 refills | Status: DC

## 2016-10-14 MED ORDER — CEPHALEXIN 500 MG PO CAPS: ORAL_CAPSULE | ORAL | 0 refills | Status: DC

## 2016-10-14 MED ORDER — FAMOTIDINE IN NACL 20-0.9 MG/50ML-% IV SOLN
20.0000 mg | Freq: Once | INTRAVENOUS | Status: AC
  Administered 2016-10-14: 20 mg via INTRAVENOUS
  Filled 2016-10-14: qty 50

## 2016-10-14 MED ORDER — ONDANSETRON 4 MG PO TBDP: 4.0000 mg | ORAL_TABLET | Freq: Three times a day (TID) | ORAL | 0 refills | Status: DC | PRN

## 2016-10-14 NOTE — Discharge Instructions (Signed)
You have a urinary tract infection. Take antibiotic as directed until completed. Stay well hydrated with plenty of water. Take your insulin as directed. STOP SMOKING! Your abdominal pain is possibly due to GERD or from gastritis or an ulcer. You will need to take zantac as directed, and avoid spicy/fatty/acidic foods, avoid soda/coffee/tea/alcohol. Avoid laying down flat within 30 minutes of eating. Avoid NSAIDs like ibuprofen/aleve/motrin/etc on an empty stomach. May consider using over the counter tums/maalox as needed for additional relief. Use zofran as directed as needed for nausea. Use tylenol as needed for pain. Follow up with your regular doctor in one week for recheck of symptoms. Return to the ER for changes or worsening symptoms.  Abdominal (belly) pain can be caused by many things. Your caregiver performed an examination and possibly ordered blood/urine tests and imaging (CT scan, x-rays, ultrasound). Many cases can be observed and treated at home after initial evaluation in the emergency department. Even though you are being discharged home, abdominal pain can be unpredictable. Therefore, you need a repeated exam if your pain does not resolve, returns, or worsens. Most patients with abdominal pain don't have to be admitted to the hospital or have surgery, but serious problems like appendicitis and gallbladder attacks can start out as nonspecific pain. Many abdominal conditions cannot be diagnosed in one visit, so follow-up evaluations are very important. SEEK IMMEDIATE MEDICAL ATTENTION IF YOU DEVELOP ANY OF THE FOLLOWING SYMPTOMS: The pain does not go away or becomes severe.  A temperature above 101 develops.  Repeated vomiting occurs (multiple episodes).  The pain becomes localized to portions of the abdomen. The right side could possibly be appendicitis. In an adult, the left lower portion of the abdomen could be colitis or diverticulitis.  Blood is being passed in stools or vomit (bright  red or black tarry stools).  Return also if you develop chest pain, difficulty breathing, dizziness or fainting, or become confused, poorly responsive, or inconsolable (young children). The constipation stays for more than 4 days.  There is belly (abdominal) or rectal pain.  You do not seem to be getting better.

## 2016-10-14 NOTE — ED Provider Notes (Signed)
[facility] DEPT Provider Note   CSN: [id number] Arrival date & time: 10/14/16  1313     History   Chief Complaint Chief Complaint  Patient presents with  . Hyperglycemia    HPI [name] Michael is a 56 y.o. female with a PMHx of GERD, DM2, HLD, HTN, TIA, CVA, colitis, dCHF, recurrent UTIs, diabetic retinopathy, retinal detachment, and other medical conditions listed below, who presents to the ED with complaints of one day of epigastric abdominal pain and indigestion, as well as 2 hours of nausea and vomiting. She describes her pain as 10/10 constant burning nonradiating epigastric pain that worsens with eating and has been somewhat improved by Tums. She states it feels like indigestion. She reports associated nausea and 3 episodes of nonbloody nonbilious emesis right after lunch. She isn't sure whether she received her NovoLog 70/3020 units this morning, however it's marked on her MAR from the long term care facility that it was given at 8 AM. Her CBG this morning at 6am was listed as 158, however over the last few days it's been ranging from 150s-300s with a lot of variability. +Smoker. Denies fevers, chills, CP, SOB, diarrhea/constipation, obstipation, melena, hematochezia, hematemesis, hematuria, dysuria, vaginal bleeding/discharge, myalgias, arthralgias, lightheadedness, diaphoresis, numbness, tingling, focal weakness, or any other complaints at this time. Denies recent travel, sick contacts, suspicious food intake, EtOH use, NSAID use, or prior abd surgeries.    The history is provided by the patient and medical records. No language interpreter was used.  Hyperglycemia  Associated symptoms: abdominal pain, nausea and vomiting   Associated symptoms: no chest pain, no confusion, no diaphoresis, no dysuria, no fever, no shortness of breath and no weakness   Abdominal Pain   This is a new problem. The current episode started yesterday. The problem occurs constantly. The problem has not  changed since onset.The pain is associated with an unknown factor. The pain is located in the epigastric region. The quality of the pain is burning. The pain is at a severity of 10/10. The pain is moderate. Associated symptoms include nausea and vomiting. Pertinent negatives include fever, diarrhea, flatus, hematochezia, melena, constipation, dysuria, hematuria, arthralgias and myalgias. The symptoms are aggravated by eating. The symptoms are relieved by OTC medications. Her past medical history is significant for GERD.    Past Medical History:  Diagnosis Date  . Diabetes mellitus   . Hyperlipidemia   . Hypertension   . Stroke [facility]) 04-01-11   left frontal subcortical, saw Dr. [name]   . TIA (transient ischemic attack) 03-12-11    Patient Active Problem List   Diagnosis Date Noted  . Metabolic acidosis 01/60/1093  . CVA (cerebral vascular accident) ([place]) 07/03/2016  . HAP (hospital-acquired pneumonia) 07/03/2016  . Sepsis secondary to UTI ([place])   . UTI due to extended-spectrum beta lactamase (ESBL) producing Escherichia coli   . Acute pyelonephritis   . Bacteremia due to Escherichia coli   . Colitis, indeterminate   . Uncontrolled type 2 diabetes mellitus with complication ([place])   . 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 associated with diabetes mellitus due to underlying condition ([place])   . Chronic diastolic CHF (congestive heart failure) ([place])   . Abdominal pain   . Colitis   . Hypophosphatemia 06/12/2016  . Hypokalemia 06/11/2016  . Hypocalcemia 06/11/2016  . Acute kidney injury ([place]) 06/11/2016  . Proliferative diabetic retinopathy ([place]) 04/29/2016  . Closed fracture of left distal radius 01/28/2016  . Poor social situation 03/09/2013  . Depression 03/01/2013  .  Abnormal mammogram 12/20/2012  . Retinal detachment 11/17/2012  . 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 ([place]) 70/35/0093  . Hyperlipidemia 02/09/2007  . Essential  hypertension 02/09/2007  . GERD 02/09/2007    Past Surgical History:  Procedure Laterality Date  . OPEN REDUCTION INTERNAL FIXATION (ORIF) DISTAL RADIAL FRACTURE Left 01/28/2016   Procedure: OPEN REDUCTION INTERNAL FIXATION (ORIF) DISTAL RADIAL FRACTURE;  Surgeon: Iran Planas, MD;  Location: [facility];  Service: Orthopedics;  Laterality: Left;  . [place]     age 31    OB History    No data available       Home Medications    Prior to Admission medications   Medication Sig Start Date End Date Taking? Authorizing Provider  aspirin 81 MG tablet Take 1 tablet (81 mg total) by mouth daily. 08/24/12   Reyne Dumas, MD  atorvastatin (LIPITOR) 20 MG tablet Take 1 tablet (20 mg total) by mouth daily. 04/29/16   Arnoldo Morale, MD  calcium carbonate (TUMS - DOSED IN MG ELEMENTAL CALCIUM) 500 MG chewable tablet Chew 2 tablets (400 mg of elemental calcium total) by mouth 2 (two) times daily with a meal. 06/30/16   Patrecia Pour, MD  insulin aspart protamine- aspart (NOVOLOG MIX 70/30) (70-30) 100 UNIT/ML injection Inject 0.2 mLs (20 Units total) into the skin 2 (two) times daily with a meal. 06/30/16   Patrecia Pour, MD  metoprolol (LOPRESSOR) 50 MG tablet Take 1 tablet (50 mg total) by mouth 2 (two) times daily. 06/30/16   Patrecia Pour, MD  Multiple Vitamin (MULTIVITAMIN) tablet Take 1 tablet by mouth daily.      Historical Provider, MD  Nystatin POWD by Does not apply route. Apply 1 application transdermal two times a day for preineal irritation    Historical Provider, MD  sodium bicarbonate 650 MG tablet Take 1 tablet (650 mg total) by mouth 2 (two) times daily. 06/30/16   Patrecia Pour, MD  UNABLE TO FIND House supplements two times a day for weight support    Historical Provider, MD    Family History Family History  Problem Relation Age of Onset  . Stroke Mother   . Diabetes Mother   . Kidney failure Mother   . Heart failure Mother     . Stroke Father   . Cancer Sister     Breast- 27's    Social History Social History  Substance Use Topics  . Smoking status: Current Every Day Smoker    Packs/day: 1.00    Years: 32.00    Types: Cigarettes    Last attempt to quit: 09/23/2012  . Smokeless tobacco: Never Used     Comment: or less  . Alcohol use No     Allergies   Hydrocodone   Review of Systems Review of Systems  Constitutional: Negative for chills, diaphoresis and fever.  Respiratory: Negative for shortness of breath.   Cardiovascular: Negative for chest pain.  Gastrointestinal: Positive for abdominal pain, nausea and vomiting. Negative for blood in stool, constipation, diarrhea, flatus, hematochezia and melena.  Genitourinary: Negative for dysuria, hematuria, vaginal bleeding and vaginal discharge.  Musculoskeletal: Negative for arthralgias and myalgias.  Skin: Negative for color change.  Allergic/Immunologic: Positive for immunocompromised state (DM2).  Neurological: Negative for weakness, light-headedness and numbness.  Psychiatric/Behavioral: Negative for confusion.   All other systems reviewed and are negative for acute change except as noted in  the HPI.    Physical Exam Updated Vital Signs BP (!) 177/84 (BP Location: Left Arm)   Pulse 87   Temp 98.9 F (37.2 C) (Oral)   Resp 18   LMP 03/06/2013   SpO2 97%   Physical Exam  Constitutional: She is oriented to person, place, and time. Vital signs are normal. She appears well-developed and well-nourished.  Non-toxic appearance. No distress.  Afebrile, nontoxic, NAD  HENT:  Head: Normocephalic and atraumatic.  Mouth/Throat: Oropharynx is clear and moist and mucous membranes are normal.  Eyes: Conjunctivae and EOM are normal. Right eye exhibits no discharge. Left eye exhibits no discharge.  Neck: Normal range of motion. Neck supple.  Cardiovascular: Normal rate, regular rhythm, normal heart sounds and intact distal pulses.  Exam reveals no gallop  and no friction rub.   No murmur heard. Pulmonary/Chest: Effort normal and breath sounds normal. No respiratory distress. She has no decreased breath sounds. She has no wheezes. She has no rhonchi. She has no rales.  Abdominal: Soft. Normal appearance and bowel sounds are normal. She exhibits no distension. There is tenderness in the right upper quadrant and epigastric area. There is positive Murphy's sign. There is no rigidity, no rebound, no guarding, no CVA tenderness and no tenderness at McBurney's point.  Soft, nondistended, +BS throughout, with mild epigastric and RUQ TTP, no r/g/r, +murphy's, neg mcburney's, no CVA TTP   Musculoskeletal: Normal range of motion.  Neurological: She is alert and oriented to person, place, and time. She has normal strength. No sensory deficit.  Skin: Skin is warm, dry and intact. No rash noted.  Psychiatric: She has a normal mood and affect.  Nursing note and vitals reviewed.    ED Treatments / Results  Labs (all labs ordered are listed, but only abnormal results are displayed) Labs Reviewed  BASIC METABOLIC PANEL - Abnormal; Notable for the following:       Result Value   Sodium 133 (*)    Chloride 90 (*)    Glucose, Bld 365 (*)    BUN 50 (*)    Creatinine, Ser 2.29 (*)    Calcium 12.3 (*)    GFR calc non Af Amer 23 (*)    GFR calc Af Amer 26 (*)    All other components within normal limits  CBC - Abnormal; Notable for the following:    WBC 10.9 (*)    HCT 35.6 (*)    All other components within normal limits  URINALYSIS, ROUTINE W REFLEX MICROSCOPIC - Abnormal; Notable for the following:    Color, Urine STRAW (*)    APPearance HAZY (*)    Specific Gravity, Urine 1.003 (*)    Glucose, UA >=500 (*)    Hgb urine dipstick SMALL (*)    Leukocytes, UA LARGE (*)    Bacteria, UA MANY (*)    Squamous Epithelial / LPF 0-5 (*)    All other components within normal limits  HEPATIC FUNCTION PANEL - Abnormal; Notable for the following:    Total  Protein 8.3 (*)    Bilirubin, Direct <0.1 (*)    All other components within normal limits  CBG MONITORING, ED - Abnormal; Notable for the following:    Glucose-Capillary 360 (*)    All other components within normal limits  URINE CULTURE  LIPASE, BLOOD  I-STAT TROPOININ, ED    EKG  EKG Interpretation  Date/Time:  Tuesday October 14 2016 14:52:36 EDT Ventricular Rate:  90 PR Interval:    QRS  Duration: 89 QT Interval:  372 QTC Calculation: 456 R Axis:   104 Text Interpretation:  Sinus rhythm Right axis deviation No significant change since last tracing Confirmed by FLOYD MD, DANIEL [id number]) on 10/14/2016 5:12:58 PM       Radiology US Abdomen Complete  Result Date: 10/14/2016 CLINICAL DATA:  Right upper quadrant pain . EXAM: ABDOMEN ULTRASOUND COMPLETE COMPARISON:  CT 06/26/2016.  Ultrasound 06/26/2016. FINDINGS: Gallbladder: Gallbladder polyps measuring up to 1.7 mm noted. No gallstones. Gallbladder wall thickness 1.8 mm. Negative Murphy sign. Common bile duct: Diameter: 2.4 mm Liver: No focal lesion identified. Within normal limits in parenchymal echogenicity. IVC: No abnormality visualized. Pancreas: Visualized portion unremarkable. Spleen: Size and appearance within normal limits. Right Kidney: Length: 11.2 cm. Echogenicity within normal limits. No mass or hydronephrosis visualized. Left Kidney: Length: 11.4 cm. Echogenicity within normal limits. No mass or hydronephrosis visualized. Abdominal aorta: No aneurysm visualized. Other findings: None. IMPRESSION: 1. Tiny gallbladder polyps. 2.  Exam otherwise unremarkable. Electronically Signed   By: Marcello Moores  Register   On: 10/14/2016 16:00    Procedures Procedures (including critical care time)  Medications Ordered in ED Medications  ondansetron (ZOFRAN) injection 4 mg (4 mg Intravenous Given 10/14/16 1646)  gi cocktail (Maalox,Lidocaine,Donnatal) (30 mLs Oral Given 10/14/16 1508)  famotidine (PEPCID) IVPB 20 mg premix (20 mg Intravenous  New Bag/Given 10/14/16 1647)  sodium chloride 0.9 % bolus 1,000 mL (1,000 mLs Intravenous New Bag/Given 10/14/16 1649)     Initial Impression / Assessment and Plan / ED Course  I have reviewed the triage vital signs and the nursing notes.  Pertinent labs & imaging results that were available during my care of the patient were reviewed by me and considered in my medical decision making (see chart for details).     56 y.o. female here with epigastric pain x1 day and n/v x2hrs. Feels like indigestion. CBG 360. Log of CBGs from long term facility looks like her sugars are running from 150s-300s in the mornings, lots of variations. Seems she got her morning dose, although she doesn't remember it. CBC with mildly elevated WBC 10.9. BMP and U/A pending. On exam, mild epigastric and RUQ TTP, +murphys. Will add-on LFTs, lipase, and U/S to eval for gallbladder/biliary etiology. Will also add-on EKG and troponin. Will give fluids, GI cocktail, zofran, and pepcid. Will reassess shortly.  6:08 PM BMP with gluc 365, Na 133 which corrects for glucose; BUN and Cr at baseline. No anion gap or bicarb abnormalities, no evidence of DKA. U/A with large leuks, TNTC WBC, many bacteria, and only 0-5 squamous so fairly good collection; likely UTI, added on culture. No ketones. Trop neg. EKG unchanged from prior, with no acute ischemic findings. Lipase WNL. LFTs unremarkable. U/S with two tiny gallbladder polyps but otherwise unremarkable. Pt feeling much better and tolerating PO well. Abd pain/n/v likely from gastritis/GERD/PUD. Will start on zantac and zofran, advised lifestyle and diet modifications for this, and OTC remedies for symptomatic relief. Will send home with keflex for her UTI (prior UCx from 05/2016 showed E.coli resistant to PCN but otherwise sensitive to other abx), advised staying hydrated. Discussed compliance of insulin at home, and hopefully with treating her infection her sugars should start to improve.  Smoking cessation advised. F/up with PCP in 5-7 days for recheck of symptoms. I explained the diagnosis and have given explicit precautions to return to the ER including for any other new or worsening symptoms. The patient understands and accepts the medical plan as it's  been dictated and I have answered their questions. Discharge instructions concerning home care and prescriptions have been given. The patient is STABLE and is discharged to home in good condition.    Final Clinical Impressions(s) / ED Diagnoses   Final diagnoses:  Non-intractable vomiting with nausea, unspecified vomiting type  Epigastric abdominal pain  Type 2 diabetes mellitus with hyperglycemia, with long-term current use of insulin (HCC)  Lower urinary tract infectious disease  Leukocytosis, unspecified type  Gastritis, presence of bleeding unspecified, unspecified chronicity, unspecified gastritis type  Gastroesophageal reflux disease, esophagitis presence not specified  Tobacco user  Gallbladder polyp    New Prescriptions New Prescriptions   CEPHALEXIN (KEFLEX) 500 MG CAPSULE    2 caps po bid x 7 days   ONDANSETRON (ZOFRAN ODT) 4 MG DISINTEGRATING TABLET    Take 1 tablet (4 mg total) by mouth every 8 (eight) hours as needed for nausea or vomiting.   RANITIDINE (ZANTAC) 150 MG TABLET    Take 1 tablet (150 mg total) by mouth 2 (two) times daily.     [address] PA-C 10/14/16 [place], MD 10/16/16 0131

## 2016-10-14 NOTE — ED Triage Notes (Signed)
Per EMS-states nausea which started an hour ago-CBG 434-CBG this am was 154-takes Novalog in am

## 2016-10-14 NOTE — ED Notes (Signed)
[facility] notified of transport to [facility].

## 2016-10-14 NOTE — ED Notes (Signed)
Attempted 2 IV attempts both unsuccessful.

## 2016-10-14 NOTE — ED Notes (Signed)
Ultrasound at bedside

## 2016-10-16 LAB — URINE CULTURE: Culture: 30000 — AB

## 2016-10-17 ENCOUNTER — Telehealth: Payer: Self-pay

## 2016-10-17 NOTE — Telephone Encounter (Deleted)
Post ED Visit - Positive Culture Follow-up  Culture report reviewed by antimicrobial stewardship pharmacist:  []  Elenor Quinones, Pharm.D. [x]  Heide Guile, Pharm.D., BCPS AQ-ID []  Parks Neptune, Pharm.D., BCPS []  Alycia Rossetti, Pharm.D., BCPS []  [place], Pharm.D., BCPS, AAHIVP []  Legrand Como, Pharm.D., BCPS, AAHIVP []  Salome Arnt, PharmD, BCPS []  Dimitri Ped, PharmD, BCPS []  Vincenza Hews, PharmD, BCPS Stop Keflex Positive urine culture Treated with Cephalexin, organism sensitive to the same and no further patient follow-up is required at this time.  Genia Del 10/17/2016, 9:13 AM

## 2016-10-17 NOTE — Telephone Encounter (Signed)
No Abx treatment needed for UC per Joline Maxcy PA-C for ED visit 10/14/16  Stop Keflex

## 2016-10-22 ENCOUNTER — Encounter (HOSPITAL_COMMUNITY): Payer: Self-pay

## 2016-10-22 ENCOUNTER — Emergency Department (HOSPITAL_COMMUNITY): Payer: Medicaid Other

## 2016-10-22 ENCOUNTER — Inpatient Hospital Stay (HOSPITAL_COMMUNITY)
Admission: EM | Admit: 2016-10-22 | Discharge: 2016-10-24 | DRG: 194 | Disposition: A | Discharge status: Skilled Nursing Facility | Payer: Medicaid Other | Attending: Family Medicine | Admitting: Family Medicine

## 2016-10-22 DIAGNOSIS — Z7982 Long term (current) use of aspirin: Secondary | ICD-10-CM

## 2016-10-22 DIAGNOSIS — N132 Hydronephrosis with renal and ureteral calculous obstruction: Secondary | ICD-10-CM | POA: Diagnosis present

## 2016-10-22 DIAGNOSIS — K5900 Constipation, unspecified: Secondary | ICD-10-CM | POA: Diagnosis present

## 2016-10-22 DIAGNOSIS — Z823 Family history of stroke: Secondary | ICD-10-CM

## 2016-10-22 DIAGNOSIS — Z803 Family history of malignant neoplasm of breast: Secondary | ICD-10-CM

## 2016-10-22 DIAGNOSIS — Z885 Allergy status to narcotic agent status: Secondary | ICD-10-CM

## 2016-10-22 DIAGNOSIS — N200 Calculus of kidney: Secondary | ICD-10-CM

## 2016-10-22 DIAGNOSIS — E860 Dehydration: Secondary | ICD-10-CM | POA: Diagnosis present

## 2016-10-22 DIAGNOSIS — F17210 Nicotine dependence, cigarettes, uncomplicated: Secondary | ICD-10-CM | POA: Diagnosis present

## 2016-10-22 DIAGNOSIS — N179 Acute kidney failure, unspecified: Secondary | ICD-10-CM | POA: Diagnosis present

## 2016-10-22 DIAGNOSIS — Z841 Family history of disorders of kidney and ureter: Secondary | ICD-10-CM

## 2016-10-22 DIAGNOSIS — Z794 Long term (current) use of insulin: Secondary | ICD-10-CM

## 2016-10-22 DIAGNOSIS — E1122 Type 2 diabetes mellitus with diabetic chronic kidney disease: Secondary | ICD-10-CM | POA: Diagnosis present

## 2016-10-22 DIAGNOSIS — N1330 Unspecified hydronephrosis: Secondary | ICD-10-CM | POA: Diagnosis present

## 2016-10-22 DIAGNOSIS — E785 Hyperlipidemia, unspecified: Secondary | ICD-10-CM | POA: Diagnosis present

## 2016-10-22 DIAGNOSIS — Z833 Family history of diabetes mellitus: Secondary | ICD-10-CM

## 2016-10-22 DIAGNOSIS — J189 Pneumonia, unspecified organism: Principal | ICD-10-CM | POA: Diagnosis present

## 2016-10-22 DIAGNOSIS — E1165 Type 2 diabetes mellitus with hyperglycemia: Secondary | ICD-10-CM | POA: Diagnosis present

## 2016-10-22 DIAGNOSIS — N189 Chronic kidney disease, unspecified: Secondary | ICD-10-CM | POA: Diagnosis present

## 2016-10-22 DIAGNOSIS — N184 Chronic kidney disease, stage 4 (severe): Secondary | ICD-10-CM | POA: Diagnosis present

## 2016-10-22 DIAGNOSIS — E11311 Type 2 diabetes mellitus with unspecified diabetic retinopathy with macular edema: Secondary | ICD-10-CM | POA: Diagnosis present

## 2016-10-22 DIAGNOSIS — Z8673 Personal history of transient ischemic attack (TIA), and cerebral infarction without residual deficits: Secondary | ICD-10-CM

## 2016-10-22 DIAGNOSIS — I129 Hypertensive chronic kidney disease with stage 1 through stage 4 chronic kidney disease, or unspecified chronic kidney disease: Secondary | ICD-10-CM | POA: Diagnosis present

## 2016-10-22 DIAGNOSIS — Z8249 Family history of ischemic heart disease and other diseases of the circulatory system: Secondary | ICD-10-CM

## 2016-10-22 DIAGNOSIS — A419 Sepsis, unspecified organism: Secondary | ICD-10-CM

## 2016-10-22 DIAGNOSIS — I10 Essential (primary) hypertension: Secondary | ICD-10-CM | POA: Diagnosis present

## 2016-10-22 DIAGNOSIS — E08311 Diabetes mellitus due to underlying condition with unspecified diabetic retinopathy with macular edema: Secondary | ICD-10-CM | POA: Diagnosis present

## 2016-10-22 LAB — CBC
HCT: 29.1 % — ABNORMAL LOW (ref 36.0–46.0)
HEMOGLOBIN: 9.8 g/dL — AB (ref 12.0–15.0)
MCH: 29.7 pg (ref 26.0–34.0)
MCHC: 33.7 g/dL (ref 30.0–36.0)
MCV: 88.2 fL (ref 78.0–100.0)
Platelets: 417 10*3/uL — ABNORMAL HIGH (ref 150–400)
RBC: 3.3 MIL/uL — ABNORMAL LOW (ref 3.87–5.11)
RDW: 12.7 % (ref 11.5–15.5)
WBC: 10.5 10*3/uL (ref 4.0–10.5)

## 2016-10-22 LAB — COMPREHENSIVE METABOLIC PANEL
ALBUMIN: 3.1 g/dL — AB (ref 3.5–5.0)
ALK PHOS: 51 U/L (ref 38–126)
ALT: 15 U/L (ref 14–54)
AST: 12 U/L — AB (ref 15–41)
Anion gap: 11 (ref 5–15)
BUN: 51 mg/dL — AB (ref 6–20)
CALCIUM: 9.2 mg/dL (ref 8.9–10.3)
CO2: 26 mmol/L (ref 22–32)
Chloride: 95 mmol/L — ABNORMAL LOW (ref 101–111)
Creatinine, Ser: 3.49 mg/dL — ABNORMAL HIGH (ref 0.44–1.00)
GFR calc Af Amer: 16 mL/min — ABNORMAL LOW (ref >60)
GFR calc non Af Amer: 14 mL/min — ABNORMAL LOW (ref >60)
GLUCOSE: 369 mg/dL — AB (ref 65–99)
Potassium: 4.2 mmol/L (ref 3.5–5.1)
SODIUM: 132 mmol/L — AB (ref 135–145)
Total Bilirubin: 0.3 mg/dL (ref 0.3–1.2)
Total Protein: 7.6 g/dL (ref 6.5–8.1)

## 2016-10-22 LAB — URINALYSIS, ROUTINE W REFLEX MICROSCOPIC
Bacteria, UA: NONE SEEN
Bilirubin Urine: NEGATIVE
Glucose, UA: >=500 mg/dL — AB
Ketones, ur: NEGATIVE mg/dL
Nitrite: NEGATIVE
Protein, ur: 30 mg/dL — AB
SPECIFIC GRAVITY, URINE: 1.009 (ref 1.005–1.030)
pH: 7 (ref 5.0–8.0)

## 2016-10-22 LAB — LIPASE, BLOOD: Lipase: 25 U/L (ref 11–51)

## 2016-10-22 LAB — I-STAT CG4 LACTIC ACID, ED: Lactic Acid, Venous: 0.86 mmol/L (ref 0.5–1.9)

## 2016-10-22 MED ORDER — IOPAMIDOL (ISOVUE-300) INJECTION 61%
INTRAVENOUS | Status: AC
  Filled 2016-10-22: qty 30

## 2016-10-22 MED ORDER — DEXTROSE 5 % IV SOLN
1.0000 g | Freq: Once | INTRAVENOUS | Status: AC
  Administered 2016-10-22: 1 g via INTRAVENOUS
  Filled 2016-10-22: qty 10

## 2016-10-22 MED ORDER — AZITHROMYCIN 500 MG IV SOLR
500.0000 mg | Freq: Once | INTRAVENOUS | Status: AC
  Administered 2016-10-22: 500 mg via INTRAVENOUS
  Filled 2016-10-22: qty 500

## 2016-10-22 MED ORDER — ACETAMINOPHEN 325 MG PO TABS
650.0000 mg | ORAL_TABLET | Freq: Once | ORAL | Status: AC | PRN
  Administered 2016-10-22: 650 mg via ORAL
  Filled 2016-10-22: qty 2

## 2016-10-22 NOTE — ED Notes (Signed)
Both cultures collected prior to antibiotic admin

## 2016-10-22 NOTE — ED Notes (Signed)
Pt unable to drink oral contrast. Per EDP, CT to be done without.

## 2016-10-22 NOTE — ED Notes (Signed)
Per MD request O2 d/c'd

## 2016-10-22 NOTE — ED Notes (Signed)
2L O2 [place] initiated

## 2016-10-22 NOTE — ED Notes (Signed)
Bed: WA21 Expected date:  Expected time:  Means of arrival:  Comments: EMS/abd. Pain/constipation

## 2016-10-22 NOTE — ED Triage Notes (Signed)
Pt BIB [facility] from [facility]. She is complaining of abdominal pain and states that her LBM was 4 days ago. Pt CBG 391 with EMS. A&Ox4.

## 2016-10-22 NOTE — ED Provider Notes (Signed)
[facility] DEPT Provider Note   CSN: [id number] Arrival date & time: 10/22/16  1654     History   Chief Complaint Chief Complaint  Patient presents with  . Abdominal Pain  . Constipation    HPI [name] Michael is a 56 y.o. female.Patient points cough and difficulty breathing since this afternoon. She complains of abdominal pain for an extended period of time evening several months. She reports her last bowel movement was yesterday. No other associated symptoms no vomiting.  HPI  Past Medical History:  Diagnosis Date  . Diabetes mellitus   . Hyperlipidemia   . Hypertension   . Stroke [facility]) 04-01-11   left frontal subcortical, saw Dr. [name]   . TIA (transient ischemic attack) 03-12-11    Patient Active Problem List   Diagnosis Date Noted  . Metabolic acidosis 06/25/7251  . CVA (cerebral vascular accident) ([place]) 07/03/2016  . HAP (hospital-acquired pneumonia) 07/03/2016  . Sepsis secondary to UTI ([place])   . UTI due to extended-spectrum beta lactamase (ESBL) producing Escherichia coli   . Acute pyelonephritis   . Bacteremia due to Escherichia coli   . Colitis, indeterminate   . Uncontrolled type 2 diabetes mellitus with complication ([place])   . 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 associated with diabetes mellitus due to underlying condition ([place])   . Chronic diastolic CHF (congestive heart failure) ([place])   . Abdominal pain   . Colitis   . Hypophosphatemia 06/12/2016  . Hypokalemia 06/11/2016  . Hypocalcemia 06/11/2016  . Acute kidney injury ([place]) 06/11/2016  . Proliferative diabetic retinopathy ([place]) 04/29/2016  . Closed fracture of left distal radius 01/28/2016  . Poor social situation 03/09/2013  . Depression 03/01/2013  . Abnormal mammogram 12/20/2012  . Retinal detachment 11/17/2012  . 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 ([place]) 66/44/0347  . Hyperlipidemia 02/09/2007  . Essential hypertension 02/09/2007  . GERD  02/09/2007    Past Surgical History:  Procedure Laterality Date  . OPEN REDUCTION INTERNAL FIXATION (ORIF) DISTAL RADIAL FRACTURE Left 01/28/2016   Procedure: OPEN REDUCTION INTERNAL FIXATION (ORIF) DISTAL RADIAL FRACTURE;  Surgeon: Iran Planas, MD;  Location: [facility];  Service: Orthopedics;  Laterality: Left;  . [place]     age 24    OB History    No data available       Home Medications    Prior to Admission medications   Medication Sig Start Date End Date Taking? Authorizing Provider  aspirin 81 MG tablet Take 1 tablet (81 mg total) by mouth daily. 08/24/12  Yes Reyne Dumas, MD  atorvastatin (LIPITOR) 20 MG tablet Take 1 tablet (20 mg total) by mouth daily. 04/29/16  Yes Arnoldo Morale, MD  calcium-vitamin D (OSCAL WITH D) 500-200 MG-UNIT tablet Take 1 tablet by mouth 2 (two) times daily.   Yes Historical Provider, MD  cephALEXin (KEFLEX) 500 MG capsule 2 caps po bid x 7 days 10/14/16  Yes Lennon, PA-C  insulin aspart protamine- aspart (NOVOLOG MIX 70/30) (70-30) 100 UNIT/ML injection Inject 0.2 mLs (20 Units total) into the skin 2 (two) times daily with a meal. 06/30/16  Yes Patrecia Pour, MD  metoprolol (LOPRESSOR) 100 MG tablet Take 100 mg by mouth 2 (two) times daily.   Yes Historical Provider, MD  Multiple Vitamins-Minerals (CERTAVITE SENIOR/ANTIOXIDANT) TABS Take 1 tablet by mouth daily.   Yes Historical Provider, MD  nystatin (NYSTATIN) powder Apply  1 g topically 2 (two) times daily.   Yes Historical Provider, MD  ondansetron (ZOFRAN ODT) 4 MG disintegrating tablet Take 1 tablet (4 mg total) by mouth every 8 (eight) hours as needed for nausea or vomiting. 10/14/16  Yes Eastman, PA-C  ranitidine (ZANTAC) 150 MG tablet Take 1 tablet (150 mg total) by mouth 2 (two) times daily. 10/14/16  Yes [place], PA-C  sodium bicarbonate 650 MG tablet Take 1 tablet (650 mg total) by mouth 2 (two) times daily.  06/30/16  Yes Patrecia Pour, MD  triamcinolone cream (KENALOG) 0.1 % Apply 1 application topically 3 (three) times daily. Apply to labia three time a day   Yes Historical Provider, MD  calcium carbonate (TUMS - DOSED IN MG ELEMENTAL CALCIUM) 500 MG chewable tablet Chew 2 tablets (400 mg of elemental calcium total) by mouth 2 (two) times daily with a meal. Patient not taking: Reported on 10/14/2016 06/30/16   Patrecia Pour, MD  metoprolol (LOPRESSOR) 50 MG tablet Take 1 tablet (50 mg total) by mouth 2 (two) times daily. Patient not taking: Reported on 10/22/2016 06/30/16   Patrecia Pour, MD    Family History Family History  Problem Relation Age of Onset  . Stroke Mother   . Diabetes Mother   . Kidney failure Mother   . Heart failure Mother   . Stroke Father   . Cancer Sister     Breast- 63's    Social History Social History  Substance Use Topics  . Smoking status: Current Every Day Smoker    Packs/day: 1.00    Years: 32.00    Types: Cigarettes    Last attempt to quit: 09/23/2012  . Smokeless tobacco: Never Used     Comment: or less  . Alcohol use No     Allergies   Hydrocodone   Review of Systems Review of Systems  Constitutional: Negative.   HENT: Negative.   Respiratory: Positive for cough and shortness of breath.   Cardiovascular: Negative.   Gastrointestinal: Positive for abdominal pain.       Chronic abdominal pain  Musculoskeletal: Negative.   Skin: Negative.   Allergic/Immunologic: Positive for immunocompromised state.  Neurological: Negative.   Psychiatric/Behavioral: Negative.   All other systems reviewed and are negative.    Physical Exam Updated Vital Signs BP (!) 153/64 (BP Location: Right Arm)   Temp (!) 101.1 F (38.4 C) (Oral)   Resp 16   LMP 03/06/2013   SpO2 91%   Physical Exam  Constitutional:  Chronically ill-appearing  HENT:  Head: Normocephalic and atraumatic.  Eyes: Conjunctivae are normal. Pupils are equal, round, and reactive to light.    Neck: Neck supple. No tracheal deviation present. No thyromegaly present.  Cardiovascular: Normal rate, regular rhythm and normal heart sounds.   No murmur heard. Pulmonary/Chest: Effort normal. She has rales.  Rales at bases bilaterally  Abdominal: Soft. Bowel sounds are normal. She exhibits no distension. There is no tenderness.  Musculoskeletal: Normal range of motion. She exhibits no edema or tenderness.  Muscular atrophy of lower extremities  Neurological: She is alert. Coordination normal.  Skin: Skin is warm and dry. No rash noted.  Psychiatric: She has a normal mood and affect.  Nursing note and vitals reviewed.    ED Treatments / Results  Labs (all labs ordered are listed, but only abnormal results are displayed) Labs Reviewed  CULTURE, BLOOD (ROUTINE X 2)  CULTURE, BLOOD (ROUTINE X 2)  LIPASE, BLOOD  COMPREHENSIVE METABOLIC  PANEL  CBC  URINALYSIS, ROUTINE W REFLEX MICROSCOPIC  I-STAT CG4 LACTIC ACID, ED  I-STAT CG4 LACTIC ACID, ED   Chest x-ray viewed by me EKG  EKG Interpretation  Date/Time:  Wednesday Oct 22 2016 18:06:50 EDT Ventricular Rate:  89 PR Interval:    QRS Duration: 91 QT Interval:  371 QTC Calculation: 452 R Axis:   99 Text Interpretation:  Sinus rhythm Borderline right axis deviation No significant change since last tracing Confirmed by Winfred Leeds  MD, Kanitra Purifoy [phone]) on 10/22/2016 6:14:46 PM      Chest x-ray viewed by me Results for orders placed or performed during the hospital encounter of 10/22/16  Lipase, blood  Result Value Ref Range   Lipase 25 11 - 51 U/L  Comprehensive metabolic panel  Result Value Ref Range   Sodium 132 (L) 135 - 145 mmol/L   Potassium 4.2 3.5 - 5.1 mmol/L   Chloride 95 (L) 101 - 111 mmol/L   CO2 26 22 - 32 mmol/L   Glucose, Bld 369 (H) 65 - 99 mg/dL   BUN 51 (H) 6 - 20 mg/dL   Creatinine, Ser 3.49 (H) 0.44 - 1.00 mg/dL   Calcium 9.2 8.9 - 10.3 mg/dL   Total Protein 7.6 6.5 - 8.1 g/dL   Albumin 3.1 (L) 3.5 -  5.0 g/dL   AST 12 (L) 15 - 41 U/L   ALT 15 14 - 54 U/L   Alkaline Phosphatase 51 38 - 126 U/L   Total Bilirubin 0.3 0.3 - 1.2 mg/dL   GFR calc non Af Amer 14 (L) >60 mL/min   GFR calc Af Amer 16 (L) >60 mL/min   Anion gap 11 5 - 15  CBC  Result Value Ref Range   WBC 10.5 4.0 - 10.5 K/uL   RBC 3.30 (L) 3.87 - 5.11 MIL/uL   Hemoglobin 9.8 (L) 12.0 - 15.0 g/dL   HCT 29.1 (L) 36.0 - 46.0 %   MCV 88.2 78.0 - 100.0 fL   MCH 29.7 26.0 - 34.0 pg   MCHC 33.7 30.0 - 36.0 g/dL   RDW 12.7 11.5 - 15.5 %   Platelets 417 (H) 150 - 400 K/uL  Urinalysis, Routine w reflex microscopic  Result Value Ref Range   Color, Urine YELLOW YELLOW   APPearance CLEAR CLEAR   Specific Gravity, Urine 1.009 1.005 - 1.030   pH 7.0 5.0 - 8.0   Glucose, UA >=500 (A) NEGATIVE mg/dL   Hgb urine dipstick SMALL (A) NEGATIVE   Bilirubin Urine NEGATIVE NEGATIVE   Ketones, ur NEGATIVE NEGATIVE mg/dL   Protein, ur 30 (A) NEGATIVE mg/dL   Nitrite NEGATIVE NEGATIVE   Leukocytes, UA SMALL (A) NEGATIVE   RBC / HPF 0-5 0 - 5 RBC/hpf   WBC, UA 6-30 0 - 5 WBC/hpf   Bacteria, UA NONE SEEN NONE SEEN   Squamous Epithelial / LPF 0-5 (A) NONE SEEN   Ct Abdomen Pelvis Wo Contrast  Result Date: 10/22/2016 CLINICAL DATA:  Abdominal pain, last bowel movement 4 days ago. EXAM: CT ABDOMEN AND PELVIS WITHOUT CONTRAST TECHNIQUE: Multidetector CT imaging of the abdomen and pelvis was performed following the standard protocol without IV contrast. COMPARISON:  None. FINDINGS: Lower chest: Bibasilar atelectasis. The visualized cardiac chambers are top-normal size without pericardial effusion. Hepatobiliary: Contracted with slightly elongated left hepatic lobe again seen draping over the spleen. No space-occupying mass given limitations of a noncontrast study. The gallbladder is nondistended. No pericholecystic fluid or wall thickening. No definite calculus  is noted within the gallbladder. Pancreas: Unremarkable Spleen: Unremarkable  Adrenals/Urinary Tract: Bilateral adrenal glands are normal. Chronic nephromegaly with perinephric fat stranding. Interval development of a moderate degree of right-sided hydroureteronephrosis secondary to a 5 mm calculus which has almost passed completely into the bladder, series 2, image 76. Nonobstructing left-sided renal calculi are noted. Stable pelvic phleboliths are otherwise noted within the pelvis bilaterally some which are vascular in etiology. Urinary bladder is physiologically distended. Stomach/Bowel: The stomach is contracted. There is normal small bowel rotation without dilatation. Normal appendix moderate degree of stool throughout large bowel consistent with constipation. Vascular/Lymphatic: Aortoiliac atherosclerosis. No aneurysm. Small retroperitoneal nonpathologic sized lymph nodes. Reproductive: Uterus and bilateral adnexa are unremarkable. Other: No abdominal wall hernia or abnormality. No abdominopelvic ascites. Musculoskeletal: No acute or significant osseous findings. IMPRESSION: 1. Moderate right-sided hydroureteronephrosis secondary to a 5 mm spindle shaped calcification which has almost passed completely into the bladder. 2. Nonobstructing left-sided nephrolithiasis. 3. 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 4. Increased colonic stool burden consistent with constipation. Electronically Signed   By: Ashley Royalty M.D.   On: 10/22/2016 21:06   Dg Chest 2 View  Result Date: 10/22/2016 CLINICAL DATA:  Cough and fever for 4 days.  Smoker. EXAM: CHEST  2 VIEW COMPARISON:  Chest CT June 26, 2016 FINDINGS: The heart size and mediastinal contours are within normal limits. Both lungs are clear. The visualized skeletal structures are unremarkable. IMPRESSION: Stable examination:  No acute cardiopulmonary process. Electronically Signed   By: Elon Alas M.D.   On: 10/22/2016  18:46   US Abdomen Complete  Result Date: 10/14/2016 CLINICAL DATA:  Right upper quadrant pain . EXAM: ABDOMEN ULTRASOUND COMPLETE COMPARISON:  CT 06/26/2016.  Ultrasound 06/26/2016. FINDINGS: Gallbladder: Gallbladder polyps measuring up to 1.7 mm noted. No gallstones. Gallbladder wall thickness 1.8 mm. Negative Murphy sign. Common bile duct: Diameter: 2.4 mm Liver: No focal lesion identified. Within normal limits in parenchymal echogenicity. IVC: No abnormality visualized. Pancreas: Visualized portion unremarkable. Spleen: Size and appearance within normal limits. Right Kidney: Length: 11.2 cm. Echogenicity within normal limits. No mass or hydronephrosis visualized. Left Kidney: Length: 11.4 cm. Echogenicity within normal limits. No mass or hydronephrosis visualized. Abdominal aorta: No aneurysm visualized. Other findings: None. IMPRESSION: 1. Tiny gallbladder polyps. 2.  Exam otherwise unremarkable. Electronically Signed   By: Marcello Moores  Register   On: 10/14/2016 16:00   Radiology No results found.  Procedures Procedures (including critical care time)  Medications Ordered in ED Medications  cefTRIAXone (ROCEPHIN) 1 g in dextrose 5 % 50 mL IVPB (not administered)  azithromycin (ZITHROMAX) 500 mg in dextrose 5 % 250 mL IVPB (not administered)  acetaminophen (TYLENOL) tablet 650 mg (650 mg Oral Given 10/22/16 1715)     Initial Impression / Assessment and Plan / ED Course  I have reviewed the triage vital signs and the nursing notes.  Pertinent labs & imaging results that were available during my care of the patient were reviewed by me and considered in my medical decision making (see chart for details). 9:45 PM patient resting comfortably after treatment with intravenous antibiotics and Tylenol and oxygen.. Denies complaint.   Sepsis - Repeat Assessment  Performed at:    945pm  Vitals     Blood pressure (!) 180/94, pulse 79, temperature 99.1 F (37.3 C), temperature source Oral, resp. rate  18, last menstrual period 03/06/2013, SpO2 100 %.  Heart:  Regular rate and rhythm  Lungs:    CTA  Capillary Refill:   <2 sec  Peripheral Pulse:   Radial pulse palpable  Skin:     Normal Color    Dr. Alcario Drought consulted and saw patient in the ED. Medically patient with pneumonia in light of cough, Rales on exam, fever   Final Clinical Impressions(s) / ED Diagnoses  Diagnosis #1sepsis Final diagnoses:  None   #2 hyperglycemia #3 renal insufficiency #4 anemia New Prescriptions New Prescriptions   No medications on file     Orlie Dakin, MD 10/22/16 2313

## 2016-10-23 DIAGNOSIS — J189 Pneumonia, unspecified organism: Principal | ICD-10-CM

## 2016-10-23 DIAGNOSIS — Z794 Long term (current) use of insulin: Secondary | ICD-10-CM | POA: Diagnosis not present

## 2016-10-23 DIAGNOSIS — E08311 Diabetes mellitus due to underlying condition with unspecified diabetic retinopathy with macular edema: Secondary | ICD-10-CM | POA: Diagnosis not present

## 2016-10-23 DIAGNOSIS — E11311 Type 2 diabetes mellitus with unspecified diabetic retinopathy with macular edema: Secondary | ICD-10-CM | POA: Diagnosis present

## 2016-10-23 DIAGNOSIS — Z8249 Family history of ischemic heart disease and other diseases of the circulatory system: Secondary | ICD-10-CM | POA: Diagnosis not present

## 2016-10-23 DIAGNOSIS — N179 Acute kidney failure, unspecified: Secondary | ICD-10-CM | POA: Diagnosis present

## 2016-10-23 DIAGNOSIS — K5900 Constipation, unspecified: Secondary | ICD-10-CM | POA: Diagnosis present

## 2016-10-23 DIAGNOSIS — N189 Chronic kidney disease, unspecified: Secondary | ICD-10-CM

## 2016-10-23 DIAGNOSIS — Z841 Family history of disorders of kidney and ureter: Secondary | ICD-10-CM | POA: Diagnosis not present

## 2016-10-23 DIAGNOSIS — Z8673 Personal history of transient ischemic attack (TIA), and cerebral infarction without residual deficits: Secondary | ICD-10-CM | POA: Diagnosis not present

## 2016-10-23 DIAGNOSIS — Z823 Family history of stroke: Secondary | ICD-10-CM | POA: Diagnosis not present

## 2016-10-23 DIAGNOSIS — A419 Sepsis, unspecified organism: Secondary | ICD-10-CM | POA: Diagnosis not present

## 2016-10-23 DIAGNOSIS — N1330 Unspecified hydronephrosis: Secondary | ICD-10-CM

## 2016-10-23 DIAGNOSIS — K5901 Slow transit constipation: Secondary | ICD-10-CM | POA: Diagnosis not present

## 2016-10-23 DIAGNOSIS — Z7982 Long term (current) use of aspirin: Secondary | ICD-10-CM | POA: Diagnosis not present

## 2016-10-23 DIAGNOSIS — Z885 Allergy status to narcotic agent status: Secondary | ICD-10-CM | POA: Diagnosis not present

## 2016-10-23 DIAGNOSIS — N184 Chronic kidney disease, stage 4 (severe): Secondary | ICD-10-CM | POA: Diagnosis present

## 2016-10-23 DIAGNOSIS — N132 Hydronephrosis with renal and ureteral calculous obstruction: Secondary | ICD-10-CM | POA: Diagnosis present

## 2016-10-23 DIAGNOSIS — Z803 Family history of malignant neoplasm of breast: Secondary | ICD-10-CM | POA: Diagnosis not present

## 2016-10-23 DIAGNOSIS — E1165 Type 2 diabetes mellitus with hyperglycemia: Secondary | ICD-10-CM | POA: Diagnosis present

## 2016-10-23 DIAGNOSIS — I129 Hypertensive chronic kidney disease with stage 1 through stage 4 chronic kidney disease, or unspecified chronic kidney disease: Secondary | ICD-10-CM | POA: Diagnosis present

## 2016-10-23 DIAGNOSIS — E1122 Type 2 diabetes mellitus with diabetic chronic kidney disease: Secondary | ICD-10-CM | POA: Diagnosis present

## 2016-10-23 DIAGNOSIS — E785 Hyperlipidemia, unspecified: Secondary | ICD-10-CM | POA: Diagnosis present

## 2016-10-23 DIAGNOSIS — I10 Essential (primary) hypertension: Secondary | ICD-10-CM | POA: Diagnosis not present

## 2016-10-23 DIAGNOSIS — F17210 Nicotine dependence, cigarettes, uncomplicated: Secondary | ICD-10-CM | POA: Diagnosis present

## 2016-10-23 DIAGNOSIS — Z833 Family history of diabetes mellitus: Secondary | ICD-10-CM | POA: Diagnosis not present

## 2016-10-23 DIAGNOSIS — N200 Calculus of kidney: Secondary | ICD-10-CM | POA: Diagnosis not present

## 2016-10-23 DIAGNOSIS — E860 Dehydration: Secondary | ICD-10-CM | POA: Diagnosis present

## 2016-10-23 LAB — INFLUENZA PANEL BY PCR (TYPE A & B)
Influenza A By PCR: NEGATIVE
Influenza B By PCR: NEGATIVE

## 2016-10-23 LAB — BASIC METABOLIC PANEL
ANION GAP: 11 (ref 5–15)
BUN: 47 mg/dL — AB (ref 6–20)
CALCIUM: 9.2 mg/dL (ref 8.9–10.3)
CO2: 26 mmol/L (ref 22–32)
Chloride: 98 mmol/L — ABNORMAL LOW (ref 101–111)
Creatinine, Ser: 3.24 mg/dL — ABNORMAL HIGH (ref 0.44–1.00)
GFR calc Af Amer: 17 mL/min — ABNORMAL LOW (ref >60)
GFR, EST NON AFRICAN AMERICAN: 15 mL/min — AB (ref >60)
Glucose, Bld: 286 mg/dL — ABNORMAL HIGH (ref 65–99)
POTASSIUM: 3.8 mmol/L (ref 3.5–5.1)
SODIUM: 135 mmol/L (ref 135–145)

## 2016-10-23 LAB — CBC
HCT: 28.6 % — ABNORMAL LOW (ref 36.0–46.0)
HEMOGLOBIN: 9.6 g/dL — AB (ref 12.0–15.0)
MCH: 29.8 pg (ref 26.0–34.0)
MCHC: 33.6 g/dL (ref 30.0–36.0)
MCV: 88.8 fL (ref 78.0–100.0)
Platelets: 437 10*3/uL — ABNORMAL HIGH (ref 150–400)
RBC: 3.22 MIL/uL — AB (ref 3.87–5.11)
RDW: 12.6 % (ref 11.5–15.5)
WBC: 12 10*3/uL — ABNORMAL HIGH (ref 4.0–10.5)

## 2016-10-23 LAB — GLUCOSE, CAPILLARY
GLUCOSE-CAPILLARY: 139 mg/dL — AB (ref 65–99)
GLUCOSE-CAPILLARY: 205 mg/dL — AB (ref 65–99)
Glucose-Capillary: 290 mg/dL — ABNORMAL HIGH (ref 65–99)
Glucose-Capillary: 218 mg/dL — ABNORMAL HIGH (ref 65–99)
Glucose-Capillary: 310 mg/dL — ABNORMAL HIGH (ref 65–99)

## 2016-10-23 LAB — STREP PNEUMONIAE URINARY ANTIGEN: STREP PNEUMO URINARY ANTIGEN: NEGATIVE

## 2016-10-23 LAB — MRSA PCR SCREENING: MRSA by PCR: POSITIVE — AB

## 2016-10-23 LAB — HIV ANTIBODY (ROUTINE TESTING W REFLEX): HIV Screen 4th Generation wRfx: NONREACTIVE

## 2016-10-23 MED ORDER — CALCIUM CARBONATE-VITAMIN D 500-200 MG-UNIT PO TABS
1.0000 | ORAL_TABLET | Freq: Two times a day (BID) | ORAL | Status: DC
  Administered 2016-10-23 – 2016-10-24 (×3): 1 via ORAL
  Filled 2016-10-23 (×3): qty 1

## 2016-10-23 MED ORDER — NEPRO/CARBSTEADY PO LIQD
237.0000 mL | Freq: Three times a day (TID) | ORAL | Status: DC
  Administered 2016-10-23 – 2016-10-24 (×3): 237 mL via ORAL
  Filled 2016-10-23 (×4): qty 237

## 2016-10-23 MED ORDER — ATORVASTATIN CALCIUM 20 MG PO TABS
20.0000 mg | ORAL_TABLET | Freq: Every day | ORAL | Status: DC
Start: 2016-10-23 — End: 2016-10-24
  Administered 2016-10-23 – 2016-10-24 (×2): 20 mg via ORAL
  Filled 2016-10-23 (×2): qty 1

## 2016-10-23 MED ORDER — FAMOTIDINE 20 MG PO TABS: 20.0000 mg | ORAL_TABLET | Freq: Two times a day (BID) | ORAL | Status: DC

## 2016-10-23 MED ORDER — ADULT MULTIVITAMIN W/MINERALS CH
1.0000 | ORAL_TABLET | Freq: Every day | ORAL | Status: DC
  Administered 2016-10-23 – 2016-10-24 (×2): 1 via ORAL
  Filled 2016-10-23 (×2): qty 1

## 2016-10-23 MED ORDER — CALCIUM CARBONATE ANTACID 500 MG PO CHEW
2.0000 | CHEWABLE_TABLET | Freq: Two times a day (BID) | ORAL | Status: DC
  Administered 2016-10-23 – 2016-10-24 (×4): 400 mg via ORAL
  Filled 2016-10-23 (×4): qty 2

## 2016-10-23 MED ORDER — METOPROLOL TARTRATE 50 MG PO TABS
100.0000 mg | ORAL_TABLET | Freq: Two times a day (BID) | ORAL | Status: DC
Start: 2016-10-23 — End: 2016-10-24
  Administered 2016-10-23 – 2016-10-24 (×3): 100 mg via ORAL
  Filled 2016-10-23 (×3): qty 2

## 2016-10-23 MED ORDER — BISACODYL 5 MG PO TBEC: 10.0000 mg | DELAYED_RELEASE_TABLET | Freq: Once | ORAL | Status: DC

## 2016-10-23 MED ORDER — VANCOMYCIN HCL IN DEXTROSE 750-5 MG/150ML-% IV SOLN
750.0000 mg | Freq: Once | INTRAVENOUS | Status: AC
  Administered 2016-10-23: 750 mg via INTRAVENOUS
  Filled 2016-10-23: qty 150

## 2016-10-23 MED ORDER — FAMOTIDINE 20 MG PO TABS
20.0000 mg | ORAL_TABLET | Freq: Every day | ORAL | Status: DC
  Administered 2016-10-23 – 2016-10-24 (×2): 20 mg via ORAL
  Filled 2016-10-23 (×2): qty 1

## 2016-10-23 MED ORDER — DEXTROSE 5 % IV SOLN
1.0000 g | INTRAVENOUS | Status: DC
  Administered 2016-10-23: 1 g via INTRAVENOUS
  Filled 2016-10-23: qty 10

## 2016-10-23 MED ORDER — ONDANSETRON 4 MG PO TBDP: 4.0000 mg | ORAL_TABLET | Freq: Three times a day (TID) | ORAL | Status: DC | PRN

## 2016-10-23 MED ORDER — HEPARIN SODIUM (PORCINE) 5000 UNIT/ML IJ SOLN
5000.0000 [IU] | Freq: Three times a day (TID) | INTRAMUSCULAR | Status: DC
  Administered 2016-10-23 – 2016-10-24 (×5): 5000 [IU] via SUBCUTANEOUS
  Filled 2016-10-23 (×5): qty 1

## 2016-10-23 MED ORDER — ASPIRIN 81 MG PO CHEW
81.0000 mg | CHEWABLE_TABLET | Freq: Every day | ORAL | Status: DC
  Administered 2016-10-23 – 2016-10-24 (×2): 81 mg via ORAL
  Filled 2016-10-23 (×2): qty 1

## 2016-10-23 MED ORDER — SODIUM BICARBONATE 650 MG PO TABS
650.0000 mg | ORAL_TABLET | Freq: Two times a day (BID) | ORAL | Status: DC
Start: 2016-10-23 — End: 2016-10-24
  Administered 2016-10-23 – 2016-10-24 (×3): 650 mg via ORAL
  Filled 2016-10-23 (×3): qty 1

## 2016-10-23 MED ORDER — ACETAMINOPHEN 325 MG PO TABS: 650.0000 mg | ORAL_TABLET | Freq: Four times a day (QID) | ORAL | Status: DC | PRN

## 2016-10-23 MED ORDER — INSULIN ASPART 100 UNIT/ML ~~LOC~~ SOLN
0.0000 [IU] | SUBCUTANEOUS | Status: DC
  Administered 2016-10-23: 5 [IU] via SUBCUTANEOUS
  Administered 2016-10-23: 1 [IU] via SUBCUTANEOUS
  Administered 2016-10-23 (×2): 3 [IU] via SUBCUTANEOUS
  Administered 2016-10-23: 7 [IU] via SUBCUTANEOUS
  Administered 2016-10-24: 1 [IU] via SUBCUTANEOUS
  Administered 2016-10-24: 7 [IU] via SUBCUTANEOUS
  Administered 2016-10-24 (×2): 2 [IU] via SUBCUTANEOUS
  Administered 2016-10-24: 9 [IU] via SUBCUTANEOUS

## 2016-10-23 MED ORDER — BISACODYL 5 MG PO TBEC
5.0000 mg | DELAYED_RELEASE_TABLET | Freq: Every day | ORAL | Status: DC | PRN
Start: 2016-10-23 — End: 2016-10-24

## 2016-10-23 MED ORDER — AZITHROMYCIN 250 MG PO TABS
500.0000 mg | ORAL_TABLET | ORAL | Status: DC
  Administered 2016-10-23 – 2016-10-24 (×2): 500 mg via ORAL
  Filled 2016-10-23 (×2): qty 2

## 2016-10-23 MED ORDER — SODIUM CHLORIDE 0.9 % IV SOLN
INTRAVENOUS | Status: DC
  Administered 2016-10-23 (×2): via INTRAVENOUS

## 2016-10-23 NOTE — H&P (Addendum)
History and Physical    [name] Michael ZJQ:[id number] DOB: [birth year] DOA: 10/22/2016  PCP: Arnoldo Morale, MD  Patient coming from: [place] have personally briefly reviewed patient's old medical records in [place]  Chief Complaint: Cough, congestion, constipation  HPI: [name] Michael is a 56 y.o. female with medical history significant of prior stroke, DM2, HTN, CKD stage 4 follows with Dr. Florene Glen she says.  Patient presents to the ED with c/o cough, congestion, and difficulty breathing.  She states that this has been ongoing over the past couple of days, worse this afternoon.   ED Course: Tm 101.  UA shows just small leuk esterase and 6-30 WBC with no bacteria seen.  This improvement from UTI on the 24th despite treatment only with Keflex when UA on the 24th grew out MRSA!  CT scan of the abd and pelvis confirms constipation, but also shows R hydronephrosis due to a 46mm stone but this stone has almost completely passed into the bladder already.   Review of Systems: As per HPI otherwise 10 point review of systems negative.   Past Medical History:  Diagnosis Date  . Diabetes mellitus   . Hyperlipidemia   . Hypertension   . Stroke [facility]) 04-01-11   left frontal subcortical, saw Dr. [name]   . TIA (transient ischemic attack) 03-12-11    Past Surgical History:  Procedure Laterality Date  . OPEN REDUCTION INTERNAL FIXATION (ORIF) DISTAL RADIAL FRACTURE Left 01/28/2016   Procedure: OPEN REDUCTION INTERNAL FIXATION (ORIF) DISTAL RADIAL FRACTURE;  Surgeon: Iran Planas, MD;  Location: [place];  Service: Orthopedics;  Laterality: Left;  . [place]     age 32     reports that she has been smoking Cigarettes.  She has a 32.00 pack-year smoking history. She has never used smokeless tobacco. She reports that she does not drink alcohol or use drugs.  Allergies  Allergen Reactions  . Hydrocodone Nausea And Vomiting     Family History  Problem Relation Age of Onset  . Stroke Mother   . Diabetes Mother   . Kidney failure Mother   . Heart failure Mother   . Stroke Father   . Cancer Sister     Breast- 42's     Prior to Admission medications   Medication Sig Start Date End Date Taking? Authorizing Provider  aspirin 81 MG tablet Take 1 tablet (81 mg total) by mouth daily. 08/24/12  Yes Reyne Dumas, MD  atorvastatin (LIPITOR) 20 MG tablet Take 1 tablet (20 mg total) by mouth daily. 04/29/16  Yes Arnoldo Morale, MD  calcium-vitamin D (OSCAL WITH D) 500-200 MG-UNIT tablet Take 1 tablet by mouth 2 (two) times daily.   Yes Historical Provider, MD  insulin aspart protamine- aspart (NOVOLOG MIX 70/30) (70-30) 100 UNIT/ML injection Inject 0.2 mLs (20 Units total) into the skin 2 (two) times daily with a meal. 06/30/16  Yes Patrecia Pour, MD  metoprolol (LOPRESSOR) 100 MG tablet Take 100 mg by mouth 2 (two) times daily.   Yes Historical Provider, MD  Multiple Vitamins-Minerals (CERTAVITE SENIOR/ANTIOXIDANT) TABS Take 1 tablet by mouth daily.   Yes Historical Provider, MD  nystatin (NYSTATIN) powder Apply 1 g topically 2 (two) times daily.   Yes Historical Provider, MD  ondansetron (ZOFRAN ODT) 4 MG disintegrating tablet Take 1 tablet (4 mg total) by mouth every 8 (eight) hours as needed for nausea or vomiting.  10/14/16  Yes [place], PA-C  ranitidine (ZANTAC) 150 MG tablet Take 1 tablet (150 mg total) by mouth 2 (two) times daily. 10/14/16  Yes [place], PA-C  sodium bicarbonate 650 MG tablet Take 1 tablet (650 mg total) by mouth 2 (two) times daily. 06/30/16  Yes Patrecia Pour, MD  triamcinolone cream (KENALOG) 0.1 % Apply 1 application topically 3 (three) times daily. Apply to labia three time a day   Yes Historical Provider, MD  calcium carbonate (TUMS - DOSED IN MG ELEMENTAL CALCIUM) 500 MG chewable tablet Chew 2 tablets (400 mg of elemental calcium total) by mouth 2 (two) times daily with a meal. Patient  not taking: Reported on 10/14/2016 06/30/16   Patrecia Pour, MD    Physical Exam: Vitals:   10/22/16 2100 10/22/16 2113 10/22/16 2200 10/22/16 2230  BP: (!) 180/94 (!) 180/94 (!) 152/70 (!) 164/83  Pulse: 84 79 79 82  Resp: (!) 23 18 (!) 21 (!) 21  Temp:  99.1 F (37.3 C)    TempSrc:  Oral    SpO2: 97% 100% 96% 99%    Constitutional: NAD, Chronically ill appearing Eyes: PERRL, lids and conjunctivae normal ENMT: Mucous membranes are moist. Posterior pharynx clear of any exudate or lesions.Normal dentition.  Neck: normal, supple, no masses, no thyromegaly Respiratory: B rales Cardiovascular: Regular rate and rhythm, no murmurs / rubs / gallops. No extremity edema. 2+ pedal pulses. No carotid bruits.  Abdomen: no tenderness, no masses palpated. No hepatosplenomegaly. Bowel sounds positive.  Musculoskeletal: no clubbing / cyanosis. No joint deformity upper and lower extremities. Good ROM, no contractures. Normal muscle tone.  Skin: no rashes, lesions, ulcers. No induration Neurologic: CN 2-12 grossly intact. Sensation intact, DTR normal. Strength 5/5 in all 4.  Psychiatric: Normal judgment and insight. Alert and oriented x 3. Normal mood.    Labs on Admission: I have personally reviewed following labs and imaging studies  CBC:  Recent Labs Lab 10/22/16 1738  WBC 10.5  HGB 9.8*  HCT 29.1*  MCV 88.2  PLT 628*   Basic Metabolic Panel:  Recent Labs Lab 10/22/16 1738  NA 132*  K 4.2  CL 95*  CO2 26  GLUCOSE 369*  BUN 51*  CREATININE 3.49*  CALCIUM 9.2   GFR: CrCl cannot be calculated (Unknown ideal weight.). Liver Function Tests:  Recent Labs Lab 10/22/16 1738  AST 12*  ALT 15  ALKPHOS 51  BILITOT 0.3  PROT 7.6  ALBUMIN 3.1*    Recent Labs Lab 10/22/16 1738  LIPASE 25   No results for input(s): AMMONIA in the last 168 hours. Coagulation Profile: No results for input(s): INR, PROTIME in the last 168 hours. Cardiac Enzymes: No results for input(s):  CKTOTAL, CKMB, CKMBINDEX, TROPONINI in the last 168 hours. BNP (last 3 results) No results for input(s): PROBNP in the last 8760 hours. HbA1C: No results for input(s): HGBA1C in the last 72 hours. CBG: No results for input(s): GLUCAP in the last 168 hours. Lipid Profile: No results for input(s): CHOL, HDL, LDLCALC, TRIG, CHOLHDL, LDLDIRECT in the last 72 hours. Thyroid Function Tests: No results for input(s): TSH, T4TOTAL, FREET4, T3FREE, THYROIDAB in the last 72 hours. Anemia Panel: No results for input(s): VITAMINB12, FOLATE, FERRITIN, TIBC, IRON, RETICCTPCT in the last 72 hours. Urine analysis:    Component Value Date/Time   COLORURINE YELLOW 10/22/2016 2053   APPEARANCEUR CLEAR 10/22/2016 2053   LABSPEC 1.009 10/22/2016 2053   PHURINE 7.0 10/22/2016 2053   GLUCOSEU >=  500 (A) 10/22/2016 2053   HGBUR SMALL (A) 10/22/2016 2053   BILIRUBINUR NEGATIVE 10/22/2016 2053   BILIRUBINUR n 05/25/2012 [place] 10/22/2016 2053   PROTEINUR 30 (A) 10/22/2016 2053   UROBILINOGEN 0.2 05/25/2012 1202   UROBILINOGEN 0.2 03/29/2011 0131   NITRITE NEGATIVE 10/22/2016 2053   LEUKOCYTESUR SMALL (A) 10/22/2016 2053    Radiological Exams on Admission: Ct Abdomen Pelvis Wo Contrast  Result Date: 10/22/2016 CLINICAL DATA:  Abdominal pain, last bowel movement 4 days ago. EXAM: CT ABDOMEN AND PELVIS WITHOUT CONTRAST TECHNIQUE: Multidetector CT imaging of the abdomen and pelvis was performed following the standard protocol without IV contrast. COMPARISON:  None. FINDINGS: Lower chest: Bibasilar atelectasis. The visualized cardiac chambers are top-normal size without pericardial effusion. Hepatobiliary: Contracted with slightly elongated left hepatic lobe again seen draping over the spleen. No space-occupying mass given limitations of a noncontrast study. The gallbladder is nondistended. No pericholecystic fluid or wall thickening. No definite calculus is noted within the gallbladder.  Pancreas: Unremarkable Spleen: Unremarkable Adrenals/Urinary Tract: Bilateral adrenal glands are normal. Chronic nephromegaly with perinephric fat stranding. Interval development of a moderate degree of right-sided hydroureteronephrosis secondary to a 5 mm calculus which has almost passed completely into the bladder, series 2, image 76. Nonobstructing left-sided renal calculi are noted. Stable pelvic phleboliths are otherwise noted within the pelvis bilaterally some which are vascular in etiology. Urinary bladder is physiologically distended. Stomach/Bowel: The stomach is contracted. There is normal small bowel rotation without dilatation. Normal appendix moderate degree of stool throughout large bowel consistent with constipation. Vascular/Lymphatic: Aortoiliac atherosclerosis. No aneurysm. Small retroperitoneal nonpathologic sized lymph nodes. Reproductive: Uterus and bilateral adnexa are unremarkable. Other: No abdominal wall hernia or abnormality. No abdominopelvic ascites. Musculoskeletal: No acute or significant osseous findings. IMPRESSION: 1. Moderate right-sided hydroureteronephrosis secondary to a 5 mm spindle shaped calcification which has almost passed completely into the bladder. 2. Nonobstructing left-sided nephrolithiasis. 3. 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 4. Increased colonic stool burden consistent with constipation. Electronically Signed   By: Ashley Royalty M.D.   On: 10/22/2016 21:06   Dg Chest 2 View  Result Date: 10/22/2016 CLINICAL DATA:  Cough and fever for 4 days.  Smoker. EXAM: CHEST  2 VIEW COMPARISON:  Chest CT June 26, 2016 FINDINGS: The heart size and mediastinal contours are within normal limits. Both lungs are clear. The visualized skeletal structures are unremarkable. IMPRESSION: Stable examination:  No acute cardiopulmonary process. Electronically Signed    By: Elon Alas M.D.   On: 10/22/2016 18:46    EKG: Independently reviewed.  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 Fever with cough, congestion, rales - CXR neg but will treat as CAP 1. PNA pathway 2. Rocephin and azithromycin.  Will also add vancomycin given that she had MRSA in the urine just last week and this was only treated with Keflex (although urine looks better right now). 1. Pyohydronephrosis is also a possibility; however, the stone is just about to pass into her bladder (is already mostly out of the ureter on CT scan) and patient BPs running 737-106 systolic so will hold off  on trying to get emergent involvement of Urology at this point 2. If patient status declines, would call urology, but this stone is about to pass (already mostly out of the ureter) based on CT scan. 3. BCx and UCx pending, sputum Cx also pending 4. Gentle hydration 5. Tylenol PRN fever 2. HTN - continue home BP meds 3. DM2 - 1. hold home 70/30 2. Sensitive SSI Q4H 4. AKI on CKD stage 4 - suspect worsening is not pre-renal given HTN but is rather secondary to obstruction given the moderate R hydro from stone which is about to pass anyhow (see above) 1. Repeat BMP in AM and trend creatinine 5. Constipation - 1. Dulcolax PO  DVT prophylaxis: Heparin [place] Code Status: Full code - per patient Family Communication: No family in room Disposition Plan: Back to SNF after admit Consults called: None Admission status: Place in obs - suspect she will be converted to inpatient during admit   [place], [place] [facility] Pager [phone]  If 7AM-7PM, please contact day  team taking care of patient [email or web address] Password [email or web address]  10/23/2016, 12:16 AM

## 2016-10-23 NOTE — Progress Notes (Signed)
Pharmacy Antibiotic Note  [name] Michael is a 56 y.o. female c/o abdominal pain and constipation admitted on 10/22/2016 with pneumonia.  Pharmacy has been consulted for vancomycin dosing.  Plan: Vancomycin 750 mg x1 5/3 at 0155 F/u Scr Will order VancR to determine when to redose      Temp (24hrs), Avg:100.1 F (37.8 C), Min:99.1 F (37.3 C), Max:101.1 F (38.4 C)   Recent Labs Lab 10/22/16 1738  WBC 10.5  CREATININE 3.49*    CrCl cannot be calculated (Unknown ideal weight.).    Allergies  Allergen Reactions  . Hydrocodone Nausea And Vomiting    Antimicrobials this admission: 5/2 zmax/roc >>  5/3 vancomycin >>   Dose adjustments this admission:   Microbiology results:  BCx:   UCx:    Sputum:    MRSA PCR:   Thank you for allowing pharmacy to be a part of this patient's care.  Dorrene German 10/23/2016 12:29 AM

## 2016-10-23 NOTE — Clinical Social Work Note (Signed)
Clinical Social Work Assessment  Patient Details  Name: [name] Michael MRN: [id number] Date of Birth: 09/02/1960  Date of referral:  10/23/16               Reason for consult:  Discharge Planning                Permission sought to share information with:  Facility Sport and exercise psychologist, Family Supports Permission granted to share information::     Name::     [facility], sister [name]  Agency::     Relationship::     Contact Information:  [phone], [phone]  Housing/Transportation Living arrangements for the past 2 months:  [place] of Information:  Facility Patient Interpreter Needed:  None Criminal Activity/Legal Involvement Pertinent to Current Situation/Hospitalization:  No - Comment as needed Significant Relationships:  Siblings, [employer] Lives with:  Facility Resident Do you feel safe going back to the place where you live?  Yes Need for family participation in patient care:  No (Coment)  Care giving concerns:  Pt from [place]. Has lived there x 2 months per staff, Wells Guiles. Was living with sister's family prior to that. Sister is pt's primary family support. Pt is independent with mobility at baseline per Wells Guiles. States pt's health declined in last 1.5 weeks leading to this hospital admission. Pt can return when medically ready. Wells Guiles states is contacting pt's sister to let her know of hospital admission.   Per chart review CSW gathered that APS report was made 06/2016 while at [facility] due to caregiving concerns when pt lived with family. CSW contacted DSS and was informed there is no DSS guardianship and that there is no active case.   Social Worker assessment / plan:  CSW consulted due to pt from ALF. Met with pt- pt sleeping. Gathered information above from facility. Plan for pt to return to [facility] at [place].  Will follow as case progresses for DC planning needs.  Will need FL2 for ALF once  ready for DC.  Employment status:    Insurance information:  Medicaid In [place] PT Recommendations:  Not assessed at this time Information / Referral to community resources:     Patient/Family's Response to care:  UTA- pt sleeping  Patient/Family's Understanding of and Emotional Response to Diagnosis, Current Treatment, and Prognosis:  UTA pt sleeping, no family at bedside.   Emotional Assessment Appearance:  Appears stated age Attitude/Demeanor/Rapport:   (sleeping) Affect (typically observed):  Calm Orientation:  Oriented to Self, Oriented to Place, Oriented to  Time Alcohol / Substance use:  Not Applicable Psych involvement (Current and /or in the community):  No (Comment)  Discharge Needs  Concerns to be addressed:  No discharge needs identified Readmission within the last 30 days:  No Current discharge risk:  None Barriers to Discharge:  Continued Medical Work up   [employer], LCSW 10/23/2016, 11:54 AM

## 2016-10-23 NOTE — Progress Notes (Signed)
Initial Nutrition Assessment  INTERVENTION:   Provide Nepro Shake po BID, each supplement provides 425 kcal and 19 grams protein Encourage PO intake RD to continue to monitor  NUTRITION DIAGNOSIS:   Inadequate oral intake related to poor appetite as evidenced by per patient/family report  GOAL:   Patient will meet greater than or equal to 90% of their needs  MONITOR:   PO intake, Supplement acceptance, Labs, Weight trends, I & O's  REASON FOR ASSESSMENT:   Malnutrition Screening Tool    ASSESSMENT:   56 y.o. female with medical history significant of prior stroke, DM2, HTN, CKD stage 4 follows with Dr. Florene Glen she says.  Patient presents to the ED with c/o cough, congestion, and difficulty breathing.  She states that this has been ongoing over the past couple of days, worse this afternoon.  Patient in room with no family at bedside. Pt presents with flat affect and seemed reluctant to talk with RD at this time. Pt states she eats about 1/2 of her meals, consumed 50% of her french toast today for a late breakfast. Pt seems interested in a protein supplement, will order Nepro shakes as she has tried these in the past.   Per chart review, weight has increased by 4 lb. No muscle or fat depletion noted.  Medications: OSCAL w/ D tablet BID, Multivitamin with minerals daily  Labs reviewed: CBGs: 139-218 GFR: 15  Diet Order:  Diet Carb Modified Fluid consistency: Thin; Room service appropriate? Yes  Skin:  Reviewed, no issues  Last BM:  4/30  Height:   Ht Readings from Last 1 Encounters:  10/23/16 4\' 2"  (1.27 m)    Weight:   Wt Readings from Last 1 Encounters:  10/23/16 88 lb 2.9 oz (40 kg)    Ideal Body Weight:  44.5 kg  BMI:  Body mass index is 24.8 kg/m.  Estimated Nutritional Needs:   Kcal:  1200-1400  Protein:  55-65g  Fluid:  1.4L/day  EDUCATION NEEDS:   No education needs identified at this time  Clayton Bibles, MS, RD, LDN Pager: [phone] After  Hours Pager: [phone]

## 2016-10-23 NOTE — Progress Notes (Signed)
Patient seen and examined at bedside, patient admitted after midnight, please see earlier detailed admission note by Etta Quill, DO. Briefly, patient presented with cough, congestion, constipation and is being treated for pneumonia. Will add influenza panel. CT abdomen significant for low lying ureter stone; will monitor this. Treating for recent MRSA bacteriuria vs UTI. Urine culture pending now.

## 2016-10-24 DIAGNOSIS — N200 Calculus of kidney: Secondary | ICD-10-CM

## 2016-10-24 LAB — URINE CULTURE: CULTURE: NO GROWTH

## 2016-10-24 LAB — GLUCOSE, CAPILLARY
GLUCOSE-CAPILLARY: 135 mg/dL — AB (ref 65–99)
Glucose-Capillary: 153 mg/dL — ABNORMAL HIGH (ref 65–99)
Glucose-Capillary: 156 mg/dL — ABNORMAL HIGH (ref 65–99)
Glucose-Capillary: 328 mg/dL — ABNORMAL HIGH (ref 65–99)
Glucose-Capillary: 388 mg/dL — ABNORMAL HIGH (ref 65–99)

## 2016-10-24 LAB — VANCOMYCIN, RANDOM: Vancomycin Rm: 17

## 2016-10-24 LAB — CREATININE, SERUM
Creatinine, Ser: 2.54 mg/dL — ABNORMAL HIGH (ref 0.44–1.00)
GFR calc Af Amer: 23 mL/min — ABNORMAL LOW (ref >60)
GFR calc non Af Amer: 20 mL/min — ABNORMAL LOW (ref >60)

## 2016-10-24 MED ORDER — NEPRO/CARBSTEADY PO LIQD: 237.0000 mL | Freq: Three times a day (TID) | ORAL | 0 refills | Status: DC

## 2016-10-24 MED ORDER — AMOXICILLIN 250 MG PO CAPS
250.0000 mg | ORAL_CAPSULE | Freq: Two times a day (BID) | ORAL | Status: DC
  Administered 2016-10-24: 250 mg via ORAL
  Filled 2016-10-24: qty 1

## 2016-10-24 MED ORDER — MUPIROCIN 2 % EX OINT
1.0000 "application " | TOPICAL_OINTMENT | Freq: Two times a day (BID) | CUTANEOUS | Status: DC
  Administered 2016-10-24: 1 via NASAL
  Filled 2016-10-24: qty 22

## 2016-10-24 MED ORDER — CHLORHEXIDINE GLUCONATE CLOTH 2 % EX PADS
6.0000 | MEDICATED_PAD | Freq: Every day | CUTANEOUS | Status: DC
  Administered 2016-10-24: 6 via TOPICAL

## 2016-10-24 MED ORDER — AZITHROMYCIN 250 MG PO TABS: 250.0000 mg | ORAL_TABLET | Freq: Every day | ORAL | 0 refills | Status: AC

## 2016-10-24 MED ORDER — AMOXICILLIN 250 MG PO CAPS: 250.0000 mg | ORAL_CAPSULE | Freq: Two times a day (BID) | ORAL | 0 refills | Status: AC

## 2016-10-24 NOTE — Progress Notes (Signed)
Pt / sister are in agreement with plan for pt to return to [facility] today. D/C Summary  / FL2 sent to ALF for review. Scripts included in [employer]. [facility] transport is needed. Medical necessity form completed. # for report provided to nsg.  Werner Lean LCSW [phone]

## 2016-10-24 NOTE — Discharge Summary (Addendum)
Physician Discharge Summary  [name] Michael ZSW:[id number] DOB: [birth year] DOA: 10/22/2016  PCP: Arnoldo Morale, MD  Admit date: 10/22/2016 Discharge date: 10/24/2016  Admitted From: ALF Disposition: ALF  Recommendations for Outpatient Follow-up:  1. Follow up with PCP in 1 week 2. Continue antibiotics 3. Smoking cessation 4. Consider repeat ultrasound of kidneys/urology follow-up as needed 5. Pending labs: Blood culture, urine culture 6. If not available, can substitute Nepro Carb Steady with Mighty Shake 7. Azithromycin end date: 10/27/2016 8. Amoxicillin end date: 10/29/2016 9. Dietitian consult for nutrition supplement   Discharge Condition: Stable CODE STATUS: Full code Diet recommendation: No concentrated sweets  Brief/Interim Summary:  Admission HPI written by Etta Quill, DO   Chief Complaint: Cough, congestion, constipation  HPI: [name] Michael is a 56 y.o. female with medical history significant of prior stroke, DM2, HTN, CKD stage 4 follows with Dr. Florene Glen she says.  Patient presents to the ED with c/o cough, congestion, and difficulty breathing.  She states that this has been ongoing over the past couple of days, worse this afternoon.   ED Course: Tm 101.  UA shows just small leuk esterase and 6-30 WBC with no bacteria seen.  This improvement from UTI on the 24th despite treatment only with Keflex when UA on the 24th grew out MRSA!  CT scan of the abd and pelvis confirms constipation, but also shows R hydronephrosis due to a 50mm stone but this stone has almost completely passed into the bladder already.    Hospital course:  Community acquired pneumonia Negative chest x-ray but clinically diagnosed. Improved with antibiotic therapy of ceftriaxone and azithromycin. Blood culture and urine culture with no growth to date. Urine strep negative. Influenza negative. Patient required oxygen but was weaned to room air before discharge. She was transitioned to amoxicillin and  azithromycin at discharge.  Essential hypertension Continued metoprolol. Still slightly hypertensive. May need adjustments as an outpatient.  Diabetes mellitus Continued 70/30 at discharge.  Acute kidney injury on CKD stage IV Baseline creatinine of 2.2. 3.49 at admission. Likely secondary to dehydration. Received some fluids with improvement. Heading towards baseline at discharge. Continue hydration.  Constipation Given Dulcolax.  Right hydronephrosis Nephrolithiasis Initially symptomatic but symptoms improved with likely passage of stone. Hydronephrosis secondary to nephrolithiasis.  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 associated with diabetes mellitus due to underlying condition ([place])   Constipation   Hydronephrosis of right kidney   Nephrolithiasis    Discharge Instructions  Discharge Instructions    Call MD for:  difficulty breathing, headache or visual disturbances    Complete by:  As directed    Call MD for:  severe uncontrolled pain    Complete by:  As directed    Call MD for:  temperature >100.4    Complete by:  As directed      Allergies as of 10/24/2016      Reactions   Hydrocodone Nausea And Vomiting      Medication List    TAKE these medications   amoxicillin 250 MG capsule Commonly known as:  AMOXIL Take 1 capsule (250 mg total) by mouth 2 (two) times daily.   aspirin 81 MG tablet Take 1 tablet (81 mg total) by mouth daily.   atorvastatin 20 MG tablet Commonly known as:  LIPITOR Take 1 tablet (  20 mg total) by mouth daily.   azithromycin 250 MG tablet Commonly known as:  ZITHROMAX Take 1 tablet (250 mg total) by mouth daily.   calcium carbonate 500 MG chewable tablet Commonly known as:  TUMS - dosed in mg elemental calcium Chew 2 tablets  (400 mg of elemental calcium total) by mouth 2 (two) times daily with a meal.   calcium-vitamin D 500-200 MG-UNIT tablet Commonly known as:  OSCAL WITH D Take 1 tablet by mouth 2 (two) times daily.   CERTAVITE SENIOR/ANTIOXIDANT Tabs Take 1 tablet by mouth daily.   insulin aspart protamine- aspart (70-30) 100 UNIT/ML injection Commonly known as:  NOVOLOG MIX 70/30 Inject 0.2 mLs (20 Units total) into the skin 2 (two) times daily with a meal.   metoprolol 100 MG tablet Commonly known as:  LOPRESSOR Take 100 mg by mouth 2 (two) times daily.   nystatin powder Generic drug:  nystatin Apply 1 g topically 2 (two) times daily.   ondansetron 4 MG disintegrating tablet Commonly known as:  ZOFRAN ODT Take 1 tablet (4 mg total) by mouth every 8 (eight) hours as needed for nausea or vomiting.   ranitidine 150 MG tablet Commonly known as:  ZANTAC Take 1 tablet (150 mg total) by mouth 2 (two) times daily.   sodium bicarbonate 650 MG tablet Take 1 tablet (650 mg total) by mouth 2 (two) times daily.   triamcinolone cream 0.1 % Commonly known as:  KENALOG Apply 1 application topically 3 (three) times daily. Apply to labia three time a day      Follow-up Information    Arnoldo Morale, MD. Schedule an appointment as soon as possible for a visit in 1 week(s).   Specialty:  Family Medicine Contact information: [address] [place] [address] [phone]          Allergies  Allergen Reactions  . Hydrocodone Nausea And Vomiting    Consultations:  None   Procedures/Studies: Ct Abdomen Pelvis Wo Contrast  Result Date: 10/22/2016 CLINICAL DATA:  Abdominal pain, last bowel movement 4 days ago. EXAM: CT ABDOMEN AND PELVIS WITHOUT CONTRAST TECHNIQUE: Multidetector CT imaging of the abdomen and pelvis was performed following the standard protocol without IV contrast. COMPARISON:  None. FINDINGS: Lower chest: Bibasilar atelectasis. The visualized cardiac chambers are  top-normal size without pericardial effusion. Hepatobiliary: Contracted with slightly elongated left hepatic lobe again seen draping over the spleen. No space-occupying mass given limitations of a noncontrast study. The gallbladder is nondistended. No pericholecystic fluid or wall thickening. No definite calculus is noted within the gallbladder. Pancreas: Unremarkable Spleen: Unremarkable Adrenals/Urinary Tract: Bilateral adrenal glands are normal. Chronic nephromegaly with perinephric fat stranding. Interval development of a moderate degree of right-sided hydroureteronephrosis secondary to a 5 mm calculus which has almost passed completely into the bladder, series 2, image 76. Nonobstructing left-sided renal calculi are noted. Stable pelvic phleboliths are otherwise noted within the pelvis bilaterally some which are vascular in etiology. Urinary bladder is physiologically distended. Stomach/Bowel: The stomach is contracted. There is normal small bowel rotation without dilatation. Normal appendix moderate degree of stool throughout large bowel consistent with constipation. Vascular/Lymphatic: Aortoiliac atherosclerosis. No aneurysm. Small retroperitoneal nonpathologic sized lymph nodes. Reproductive: Uterus and bilateral adnexa are unremarkable. Other: No abdominal wall hernia or abnormality. No abdominopelvic ascites. Musculoskeletal: No acute or significant osseous findings. IMPRESSION: 1. Moderate right-sided hydroureteronephrosis secondary to a 5 mm spindle shaped calcification which has almost passed completely into the bladder. 2. Nonobstructing left-sided nephrolithiasis. 3. Chronic bilateral  nephromegaly with perinephric fat stranding is again noted and may represent chronic ongoing renal inflammation or genitourinary infection. 4. Increased colonic stool burden consistent with constipation. Electronically Signed   By: Ashley Royalty M.D.   On: 10/22/2016 21:06   Dg Chest 2 View  Result Date:  10/22/2016 CLINICAL DATA:  Cough and fever for 4 days.  Smoker. EXAM: CHEST  2 VIEW COMPARISON:  Chest CT June 26, 2016 FINDINGS: The heart size and mediastinal contours are within normal limits. Both lungs are clear. The visualized skeletal structures are unremarkable. IMPRESSION: Stable examination:  No acute cardiopulmonary process. Electronically Signed   By: Elon Alas M.D.   On: 10/22/2016 18:46   US Abdomen Complete  Result Date: 10/14/2016 CLINICAL DATA:  Right upper quadrant pain . EXAM: ABDOMEN ULTRASOUND COMPLETE COMPARISON:  CT 06/26/2016.  Ultrasound 06/26/2016. FINDINGS: Gallbladder: Gallbladder polyps measuring up to 1.7 mm noted. No gallstones. Gallbladder wall thickness 1.8 mm. Negative Murphy sign. Common bile duct: Diameter: 2.4 mm Liver: No focal lesion identified. Within normal limits in parenchymal echogenicity. IVC: No abnormality visualized. Pancreas: Visualized portion unremarkable. Spleen: Size and appearance within normal limits. Right Kidney: Length: 11.2 cm. Echogenicity within normal limits. No mass or hydronephrosis visualized. Left Kidney: Length: 11.4 cm. Echogenicity within normal limits. No mass or hydronephrosis visualized. Abdominal aorta: No aneurysm visualized. Other findings: None. IMPRESSION: 1. Tiny gallbladder polyps. 2.  Exam otherwise unremarkable. Electronically Signed   By: Marcello Moores  Register   On: 10/14/2016 16:00      Subjective: Patient reports no chest pain, dyspnea, abdominal pain, nausea or vomiting. No cough.  Discharge Exam: Vitals:   10/24/16 0923 10/24/16 1419  BP: (!) 143/59 (!) 171/79  Pulse: 78 82  Resp:  16  Temp:  98.3 F (36.8 C)   Vitals:   10/24/16 0420 10/24/16 0923 10/24/16 1009 10/24/16 1419  BP: (!) 155/73 (!) 143/59  (!) 171/79  Pulse: 86 78  82  Resp: 16   16  Temp: 98.8 F (37.1 C)   98.3 F (36.8 C)  TempSrc: Oral   Oral  SpO2: 98%  95% 96%  Weight:      Height:        General: Pt is alert, awake, not in  acute distress Cardiovascular: RRR, S1/S2 +, no rubs, no gallops Respiratory: CTA bilaterally, no wheezing, no rhonchi Abdominal: Soft, NT, ND, bowel sounds + Extremities: no edema, no cyanosis    The results of significant diagnostics from this hospitalization (including imaging, microbiology, ancillary and laboratory) are listed below for reference.     Microbiology: Recent Results (from the past 240 hour(s))  Blood Culture (routine x 2)     Status: None (Preliminary result)   Collection Time: 10/22/16  6:06 PM  Result Value Ref Range Status   Specimen Description BLOOD LEFT WRIST  Final   Special Requests   Final    BOTTLES DRAWN AEROBIC AND ANAEROBIC Blood Culture adequate volume   Culture   Final    NO GROWTH 2 DAYS Performed at [facility], [address] [place],  [address]    Report Status PENDING  Incomplete  Blood Culture (routine x 2)     Status: None (Preliminary result)   Collection Time: 10/22/16  6:08 PM  Result Value Ref Range Status   Specimen Description BLOOD BLOOD RIGHT HAND  Final   Special Requests   Final    BOTTLES DRAWN AEROBIC AND ANAEROBIC Blood Culture adequate volume   Culture  Final    NO GROWTH 2 DAYS Performed at [facility], [place] [address], [place], [place] [address]    Report Status PENDING  Incomplete  Culture, Urine     Status: None   Collection Time: 10/23/16  3:02 AM  Result Value Ref Range Status   Specimen Description URINE, RANDOM  Final   Special Requests NONE  Final   Culture   Final    NO GROWTH Performed at [facility], [place] [address] [place], [place] [address]    Report Status 10/24/2016 FINAL  Final  MRSA PCR Screening     Status: Abnormal   Collection Time: 10/23/16  8:56 AM  Result Value Ref Range Status   MRSA by PCR POSITIVE (A) NEGATIVE Final    Comment:        The GeneXpert MRSA Assay (FDA approved for NASAL specimens only), is one component of a comprehensive MRSA  colonization surveillance program. It is not intended to diagnose MRSA infection nor to guide or monitor treatment for MRSA infections. RESULT CALLED TO, READ BACK BY AND VERIFIED WITH: HERNDON,F. RN @1311  ON 05.03.18 BY COHEN,K      Labs: BNP (last 3 results) No results for input(s): BNP in the last 8760 hours. Basic Metabolic Panel:  Recent Labs Lab 10/22/16 1738 10/23/16 0426 10/24/16 0436  NA 132* 135  --   K 4.2 3.8  --   CL 95* 98*  --   CO2 26 26  --   GLUCOSE 369* 286*  --   BUN 51* 47*  --   CREATININE 3.49* 3.24* 2.54*  CALCIUM 9.2 9.2  --    Liver Function Tests:  Recent Labs Lab 10/22/16 1738  AST 12*  ALT 15  ALKPHOS 51  BILITOT 0.3  PROT 7.6  ALBUMIN 3.1*    Recent Labs Lab 10/22/16 1738  LIPASE 25   No results for input(s): AMMONIA in the last 168 hours. CBC:  Recent Labs Lab 10/22/16 1738 10/23/16 0426  WBC 10.5 12.0*  HGB 9.8* 9.6*  HCT 29.1* 28.6*  MCV 88.2 88.8  PLT 417* 437*   Cardiac Enzymes: No results for input(s): CKTOTAL, CKMB, CKMBINDEX, TROPONINI in the last 168 hours. BNP: Invalid input(s): POCBNP CBG:  Recent Labs Lab 10/23/16 2101 10/24/16 0051 10/24/16 0411 10/24/16 0714 10/24/16 1151  GLUCAP 205* 156* 153* 135* 388*   D-Dimer No results for input(s): DDIMER in the last 72 hours. Hgb A1c No results for input(s): HGBA1C in the last 72 hours. Lipid Profile No results for input(s): CHOL, HDL, LDLCALC, TRIG, CHOLHDL, LDLDIRECT in the last 72 hours. Thyroid function studies No results for input(s): TSH, T4TOTAL, T3FREE, THYROIDAB in the last 72 hours.  Invalid input(s): FREET3 Anemia work up No results for input(s): VITAMINB12, FOLATE, FERRITIN, TIBC, IRON, RETICCTPCT in the last 72 hours. Urinalysis    Component Value Date/Time   COLORURINE YELLOW 10/22/2016 2053   APPEARANCEUR CLEAR 10/22/2016 2053   LABSPEC 1.009 10/22/2016 2053   PHURINE 7.0 10/22/2016 2053   GLUCOSEU >=500 (A) 10/22/2016 2053    HGBUR SMALL (A) 10/22/2016 2053   BILIRUBINUR NEGATIVE 10/22/2016 2053   BILIRUBINUR n 05/25/2012 [place] 10/22/2016 2053   PROTEINUR 30 (A) 10/22/2016 2053   UROBILINOGEN 0.2 05/25/2012 1202   UROBILINOGEN 0.2 03/29/2011 0131   NITRITE NEGATIVE 10/22/2016 2053   LEUKOCYTESUR SMALL (A) 10/22/2016 2053   Sepsis Labs Invalid input(s): PROCALCITONIN,  WBC,  LACTICIDVEN Microbiology Recent Results (from  the past 240 hour(s))  Blood Culture (routine x 2)     Status: None (Preliminary result)   Collection Time: 10/22/16  6:06 PM  Result Value Ref Range Status   Specimen Description BLOOD LEFT WRIST  Final   Special Requests   Final    BOTTLES DRAWN AEROBIC AND ANAEROBIC Blood Culture adequate volume   Culture   Final    NO GROWTH 2 DAYS Performed at [facility], [address], [place], [place] [address]    Report Status PENDING  Incomplete  Blood Culture (routine x 2)     Status: None (Preliminary result)   Collection Time: 10/22/16  6:08 PM  Result Value Ref Range Status   Specimen Description BLOOD BLOOD RIGHT HAND  Final   Special Requests   Final    BOTTLES DRAWN AEROBIC AND ANAEROBIC Blood Culture adequate volume   Culture   Final    NO GROWTH 2 DAYS Performed at [facility], [place] [address] [place], [place] [address]    Report Status PENDING  Incomplete  Culture, Urine     Status: None   Collection Time: 10/23/16  3:02 AM  Result Value Ref Range Status   Specimen Description URINE, RANDOM  Final   Special Requests NONE  Final   Culture   Final    NO GROWTH Performed at [facility], [place] [address], [place], [place] [address]    Report Status 10/24/2016 FINAL  Final  MRSA PCR Screening     Status: Abnormal   Collection Time: 10/23/16  8:56 AM  Result Value Ref Range Status   MRSA by PCR POSITIVE (A) NEGATIVE Final    Comment:        The GeneXpert MRSA Assay (FDA approved for NASAL specimens only), is one component of  a comprehensive MRSA colonization surveillance program. It is not intended to diagnose MRSA infection nor to guide or monitor treatment for MRSA infections. RESULT CALLED TO, READ BACK BY AND VERIFIED WITH: HERNDON,F. RN @1311  ON 05.03.18 BY COHEN,K      Time coordinating discharge: Over 30 minutes  SIGNED:   Cordelia Poche, MD [facility] 10/24/2016, 3:42 PM Pager [phone]  If 7PM-7AM, please contact night-coverage [email or web address] Password [email or web address]

## 2016-10-24 NOTE — NC FL2 (Deleted)
[place] MEDICAID FL2 LEVEL OF CARE SCREENING TOOL     IDENTIFICATION  Patient Name: [name] Michael Birthdate: Apr 11, 1961 Sex: female Admission Date (Current Location): 10/22/2016  [facility] [place] and [place] Number:  Herbalist and Address:  [facility],  [place] [address] [place]      Provider Number: [id number]  Attending Physician Name and Address:  Mariel Aloe, MD  Relative Name and Phone Number:       Current Level of Care: Hospital Recommended Level of Care: [place] Prior Approval Number:    Date Approved/Denied:   PASRR Number:    Discharge Plan: Other (Comment) ([place])    Current Diagnoses: Patient Active Problem List   Diagnosis Date Noted  . Nephrolithiasis 10/24/2016  . Constipation 10/22/2016  . CAP (community acquired pneumonia) 10/22/2016  . Hydronephrosis of right kidney 10/22/2016  . Metabolic acidosis 89/38/1017  . CVA (cerebral vascular accident) ([place]) 07/03/2016  . HAP (hospital-acquired pneumonia) 07/03/2016  . Sepsis secondary to UTI ([place])   . UTI due to extended-spectrum beta lactamase (ESBL) producing Escherichia coli   . Acute pyelonephritis   . Bacteremia due to Escherichia coli   . Colitis, indeterminate   . Uncontrolled type 2 diabetes mellitus with complication ([place])   . 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 Chronic diastolic CHF (congestive heart failure) ([place])   . Abdominal pain   . Colitis   . Hypophosphatemia 06/12/2016  . Hypokalemia 06/11/2016  . Hypocalcemia 06/11/2016  . Acute kidney injury superimposed on chronic kidney disease ([place]) 06/11/2016  . Proliferative diabetic retinopathy ([place]) 04/29/2016  . Closed fracture of left distal radius 01/28/2016  . Poor social situation 03/09/2013  . Depression 03/01/2013  . Abnormal mammogram 12/20/2012  . Retinal detachment 11/17/2012   . 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 ([place]) 51/07/5850  . Hyperlipidemia 02/09/2007  . Essential hypertension 02/09/2007  . GERD 02/09/2007    Orientation RESPIRATION BLADDER Height & Weight     Self, Situation, Place  Normal Continent Weight: 88 lb 2.9 oz (40 kg) Height:  4\' 2"  (127 cm)  BEHAVIORAL SYMPTOMS/MOOD NEUROLOGICAL BOWEL NUTRITION STATUS  Other (Comment) (No behaviors)   Continent Diet (carb modified)  AMBULATORY STATUS COMMUNICATION OF NEEDS Skin   Limited Assist Verbally Normal                       Personal Care Assistance Level of Assistance  Bathing, Feeding, Dressing Bathing Assistance: Limited assistance Feeding assistance: Independent Dressing Assistance: Limited assistance     Functional Limitations Info  Sight, Hearing, Speech Sight Info: Adequate Hearing Info: Adequate Speech Info: Adequate    SPECIAL CARE FACTORS FREQUENCY                       Contractures Contractures Info: Not present    Additional Factors Info  Code Status Code Status Info: Full Code             Current Medications (10/24/2016):  This is the current hospital active medication list Current Facility-Administered Medications  Medication Dose Route Frequency Provider Last Rate Last Dose  . 0.9 %  sodium chloride infusion   Intravenous Continuous Etta Quill, DO 50 mL/hr at 10/24/16 0240    . acetaminophen (TYLENOL) tablet 650 mg  650 mg Oral Q6H PRN Etta Quill, DO      .  amoxicillin (AMOXIL) capsule 250 mg  250 mg Oral BID Mariel Aloe, MD   250 mg at 10/24/16 1003  . aspirin chewable tablet 81 mg  81 mg Oral Daily Etta Quill, DO   81 mg at 10/24/16 4174  . atorvastatin (LIPITOR) tablet 20 mg  20 mg Oral q1800 Etta Quill, DO   20 mg at 10/23/16 1759  . azithromycin (ZITHROMAX) tablet 500 mg  500 mg Oral Q24H Etta Quill, DO   500 mg at 10/23/16 1759  . bisacodyl (DULCOLAX) EC tablet 10 mg  10 mg Oral Once  Jared M Gardner, DO      . bisacodyl (DULCOLAX) EC tablet 5 mg  5 mg Oral Daily PRN Etta Quill, DO      . calcium carbonate (TUMS - dosed in mg elemental calcium) chewable tablet 400 mg of elemental calcium  2 tablet Oral BID WC Etta Quill, DO   400 mg of elemental calcium at 10/24/16 0805  . calcium-vitamin D (OSCAL WITH D) 500-200 MG-UNIT per tablet 1 tablet  1 tablet Oral BID Etta Quill, DO   1 tablet at 10/24/16 0814  . Chlorhexidine Gluconate Cloth 2 % PADS 6 each  6 each Topical Q0600 Mariel Aloe, MD   6 each at 10/24/16 0413  . famotidine (PEPCID) tablet 20 mg  20 mg Oral Daily Mariel Aloe, MD   20 mg at 10/24/16 4818  . feeding supplement (NEPRO CARB STEADY) liquid 237 mL  237 mL Oral TID BM Mariel Aloe, MD 0 mL/hr at 10/23/16 2130 237 mL at 10/24/16 1000  . heparin injection 5,000 Units  5,000 Units Subcutaneous Q8H Etta Quill, DO   5,000 Units at 10/24/16 [phone]  . insulin aspart (novoLOG) injection 0-9 Units  0-9 Units Subcutaneous Q4H Etta Quill, DO   1 Units at 10/24/16 0805  . metoprolol (LOPRESSOR) tablet 100 mg  100 mg Oral BID Etta Quill, DO   100 mg at 10/24/16 4970  . multivitamin with minerals tablet 1 tablet  1 tablet Oral Daily Etta Quill, DO   1 tablet at 10/24/16 2637  . mupirocin ointment (BACTROBAN) 2 % 1 application  1 application Nasal BID Mariel Aloe, MD   1 application at 85/88/50 0805  . ondansetron (ZOFRAN-ODT) disintegrating tablet 4 mg  4 mg Oral Q8H PRN Etta Quill, DO      . sodium bicarbonate tablet 650 mg  650 mg Oral BID Etta Quill, DO   650 mg at 10/24/16 2774     Discharge Medications: Please see discharge summary for a list of discharge medica TAKE these medications   amoxicillin 250 MG capsule Commonly known as:  AMOXIL Take 1 capsule (250 mg total) by mouth 2 (two) times daily.   aspirin 81 MG tablet Take 1 tablet (81 mg total) by mouth daily.   atorvastatin 20 MG tablet Commonly known as:   LIPITOR Take 1 tablet (20 mg total) by mouth daily.   azithromycin 250 MG tablet Commonly known as:  ZITHROMAX Take 1 tablet (250 mg total) by mouth daily.   calcium carbonate 500 MG chewable tablet Commonly known as:  TUMS - dosed in mg elemental calcium Chew 2 tablets (400 mg of elemental calcium total) by mouth 2 (two) times daily with a meal.   calcium-vitamin D 500-200 MG-UNIT tablet Commonly known as:  OSCAL WITH D Take 1 tablet by mouth 2 (two) times  daily.   CERTAVITE SENIOR/ANTIOXIDANT Tabs Take 1 tablet by mouth daily.   feeding supplement (NEPRO CARB STEADY) Liqd Take 237 mLs by mouth 3 (three) times daily between meals.   insulin aspart protamine- aspart (70-30) 100 UNIT/ML injection Commonly known as:  NOVOLOG MIX 70/30 Inject 0.2 mLs (20 Units total) into the skin 2 (two) times daily with a meal.   metoprolol 100 MG tablet Commonly known as:  LOPRESSOR Take 100 mg by mouth 2 (two) times daily.   nystatin powder Generic drug:  nystatin Apply 1 g topically 2 (two) times daily.   ondansetron 4 MG disintegrating tablet Commonly known as:  ZOFRAN ODT Take 1 tablet (4 mg total) by mouth every 8 (eight) hours as needed for nausea or vomiting.   ranitidine 150 MG tablet Commonly known as:  ZANTAC Take 1 tablet (150 mg total) by mouth 2 (two) times daily.   sodium bicarbonate 650 MG tablet Take 1 tablet (650 mg total) by mouth 2 (two) times daily.   triamcinolone cream 0.1 % Commonly known as:  KENALOG Apply 1 application topically 3 (three) times daily. Apply to labia three time a day        Relevant Imaging Results:  Relevant Lab Results:   Additional Information SSN: [id number]  Wrenly Lauritsen, Randall An, LCSW

## 2016-10-24 NOTE — Progress Notes (Signed)
Pharmacy Antibiotic Note  [name] Michael is a 56 y.o. female c/o abdominal pain and constipation admitted on 10/22/2016 with pneumonia.  Pharmacy has been consulted for vancomycin dosing.  Vancomycin 750 mg x1 5/3 at [place] random level 5/4 am was 17 mcg/ml, within therapeutic range  Vanc and Ceftriaxone discontinued this am  Plan: Abx narrowed to Amoxicillin 250mg  tid (dose reduction for renal function) & continue Azithromycin po  Height: 4\' 2"  (127 cm) Weight: 88 lb 2.9 oz (40 kg) IBW/kg (Calculated) : 22.5  Temp (24hrs), Avg:98.5 F (36.9 C), Min:98.4 F (36.9 C), Max:98.8 F (37.1 C)   Recent Labs Lab 10/22/16 1738 10/23/16 0426 10/24/16 0436  WBC 10.5 12.0*  --   CREATININE 3.49* 3.24* 2.54*  VANCORANDOM  --   --  17    Estimated Creatinine Clearance: 11.5 mL/min (A) (by C-G formula based on SCr of 2.54 mg/dL (H)).    Allergies  Allergen Reactions  . Hydrocodone Nausea And Vomiting    Antimicrobials this admission: 5/2 CTX >> 5/4  5/2 Azithromycin >> 5/3 vancomycin >> 5/4  5/4 Amoxicillin >>  Microbiology results: 5/2 BCx: sent  5/3 Strep pneumo: neg 5/3 UCx: sent (CTX/Azith given prior to cx) 5/3 MRSA PCR: positive 5/3 Flu panel: neg/neg Sputum: ordered 4/24 UCx: MRSA  Thank you for allowing pharmacy to be a part of this patient's care.  Minda Ditto 10/24/2016 7:08 AM

## 2016-10-24 NOTE — Progress Notes (Signed)
Inpatient Diabetes Program Recommendations  AACE/ADA: New Consensus Statement on Inpatient Glycemic Control (2015)  Target Ranges:  Prepandial:   less than 140 mg/dL      Peak postprandial:   less than 180 mg/dL (1-2 hours)      Critically ill patients:  140 - 180 mg/dL   Lab Results  Component Value Date   GLUCAP 388 (H) 10/24/2016   HGBA1C 10.9 04/29/2016    Review of Glycemic Control: Results for TINNA, KOLKER (MRN [id number]) as of 10/24/2016 14:40  Ref. Range 10/23/2016 08:04 10/23/2016 12:10 10/23/2016 17:48 10/23/2016 21:01 10/24/2016 00:51 10/24/2016 04:11 10/24/2016 07:14 10/24/2016 11:51  Glucose-Capillary Latest Ref Range: 65 - 99 mg/dL 139 (H) 218 (H) 310 (H) 205 (H) 156 (H) 153 (H) 135 (H) 388 (H)     Diabetes history: Type 2 diabetes Outpatient Diabetes medications: Novolog 70/30 20 units bid Current orders for Inpatient glycemic control:  Novolog sensitive q 4 hours  Inpatient Diabetes Program Recommendations:    Please consider restarting 1/2 of home dose of 70/30 while in the hospital.  Consider 10 units of Novolog 70/30 bid with meals.   Thanks, Adah Perl, RN, BC-ADM Inpatient Diabetes Coordinator Pager [phone] (8a-5p)

## 2016-10-24 NOTE — NC FL2 (Signed)
[place] MEDICAID FL2 LEVEL OF CARE SCREENING TOOL     IDENTIFICATION  Patient Name: [name] Michael Birthdate: 1960/09/30 Sex: female Admission Date (Current Location): 10/22/2016  [facility] and [place] Number:  Herbalist and Address:  [facility],  [place] [address], [place]      Provider Number: [id number]  Attending Physician Name and Address:  Mariel Aloe, MD  Relative Name and Phone Number:       Current Level of Care: Hospital Recommended Level of Care: [place] Prior Approval Number:    Date Approved/Denied:   PASRR Number:    Discharge Plan: Other (Comment) ([place])    Current Diagnoses: Patient Active Problem List   Diagnosis Date Noted  . Nephrolithiasis 10/24/2016  . Constipation 10/22/2016  . CAP (community acquired pneumonia) 10/22/2016  . Hydronephrosis of right kidney 10/22/2016  . Metabolic acidosis 65/08/5463  . CVA (cerebral vascular accident) ([place]) 07/03/2016  . HAP (hospital-acquired pneumonia) 07/03/2016  . Sepsis secondary to UTI ([place])   . UTI due to extended-spectrum beta lactamase (ESBL) producing Escherichia coli   . Acute pyelonephritis   . Bacteremia due to Escherichia coli   . Colitis, indeterminate   . Uncontrolled type 2 diabetes mellitus with complication ([place])   . 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 associated with diabetes mellitus due to underlying condition ([place])   . Chronic diastolic CHF (congestive heart failure) ([place])   . Abdominal pain   . Colitis   . Hypophosphatemia 06/12/2016  . Hypokalemia 06/11/2016  . Hypocalcemia 06/11/2016  . Acute kidney injury superimposed on chronic kidney disease ([place]) 06/11/2016  . Proliferative diabetic retinopathy ([place]) 04/29/2016  . Closed fracture of left distal radius 01/28/2016  . Poor social situation 03/09/2013  . Depression 03/01/2013  . Abnormal mammogram 12/20/2012  . Retinal detachment 11/17/2012   . 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 ([place]) 68/05/7516  . Hyperlipidemia 02/09/2007  . Essential hypertension 02/09/2007  . GERD 02/09/2007    Orientation RESPIRATION BLADDER Height & Weight     Self, Situation, Place  Normal Continent Weight: 88 lb 2.9 oz (40 kg) Height:  4\' 2"  (127 cm)  BEHAVIORAL SYMPTOMS/MOOD NEUROLOGICAL BOWEL NUTRITION STATUS  Other (Comment) (No behaviors)   Continent Diet ( No concentrated sweets )  AMBULATORY STATUS COMMUNICATION OF NEEDS Skin   Limited Assist Verbally Normal                       Personal Care Assistance Level of Assistance  Bathing, Feeding, Dressing Bathing Assistance: Limited assistance Feeding assistance: Independent Dressing Assistance: Limited assistance     Functional Limitations Info  Sight, Hearing, Speech Sight Info: Adequate Hearing Info: Adequate Speech Info: Adequate    SPECIAL CARE FACTORS FREQUENCY                       Contractures Contractures Info: Not present    Additional Factors Info  Code Status Code Status Info: Full Code             Current Medications (10/24/2016):  This is the current hospital active medication list Current Facility-Administered Medications  Medication Dose Route Frequency Provider Last Rate Last Dose  . 0.9 %  sodium chloride infusion   Intravenous Continuous Etta Quill, DO   Stopped at 10/24/16 1226  . acetaminophen (TYLENOL) tablet 650 mg  650 mg Oral Q6H PRN Etta Quill,  DO      . amoxicillin (AMOXIL) capsule 250 mg  250 mg Oral BID Mariel Aloe, MD   250 mg at 10/24/16 1003  . aspirin chewable tablet 81 mg  81 mg Oral Daily Etta Quill, DO   81 mg at 10/24/16 4163  . atorvastatin (LIPITOR) tablet 20 mg  20 mg Oral q1800 Etta Quill, DO   20 mg at 10/23/16 1759  . azithromycin (ZITHROMAX) tablet 500 mg  500 mg Oral Q24H Etta Quill, DO   500 mg at 10/23/16 1759  . bisacodyl (DULCOLAX) EC tablet 10 mg  10 mg  Oral Once Jared M Gardner, DO      . bisacodyl (DULCOLAX) EC tablet 5 mg  5 mg Oral Daily PRN Etta Quill, DO      . calcium carbonate (TUMS - dosed in mg elemental calcium) chewable tablet 400 mg of elemental calcium  2 tablet Oral BID WC Etta Quill, DO   400 mg of elemental calcium at 10/24/16 0805  . calcium-vitamin D (OSCAL WITH D) 500-200 MG-UNIT per tablet 1 tablet  1 tablet Oral BID Etta Quill, DO   1 tablet at 10/24/16 8453  . Chlorhexidine Gluconate Cloth 2 % PADS 6 each  6 each Topical Q0600 Mariel Aloe, MD   6 each at 10/24/16 0413  . famotidine (PEPCID) tablet 20 mg  20 mg Oral Daily Mariel Aloe, MD   20 mg at 10/24/16 6468  . feeding supplement (NEPRO CARB STEADY) liquid 237 mL  237 mL Oral TID BM Mariel Aloe, MD 0 mL/hr at 10/23/16 2130 237 mL at 10/24/16 1400  . heparin injection 5,000 Units  5,000 Units Subcutaneous Q8H Etta Quill, DO   5,000 Units at 10/24/16 1354  . insulin aspart (novoLOG) injection 0-9 Units  0-9 Units Subcutaneous Q4H Etta Quill, DO   9 Units at 10/24/16 1226  . metoprolol (LOPRESSOR) tablet 100 mg  100 mg Oral BID Etta Quill, DO   100 mg at 10/24/16 0321  . multivitamin with minerals tablet 1 tablet  1 tablet Oral Daily Etta Quill, DO   1 tablet at 10/24/16 2248  . mupirocin ointment (BACTROBAN) 2 % 1 application  1 application Nasal BID Mariel Aloe, MD   1 application at 25/00/37 0805  . ondansetron (ZOFRAN-ODT) disintegrating tablet 4 mg  4 mg Oral Q8H PRN Etta Quill, DO      . sodium bicarbonate tablet 650 mg  650 mg Oral BID Etta Quill, DO   650 mg at 10/24/16 0488     Discharge Medications: Medication List    TAKE these medications   amoxicillin 250 MG capsule Commonly known as:  AMOXIL Take 1 capsule (250 mg total) by mouth 2 (two) times daily.   aspirin 81 MG tablet Take 1 tablet (81 mg total) by mouth daily.   atorvastatin 20 MG tablet Commonly known as:  LIPITOR Take 1 tablet (20  mg total) by mouth daily.   azithromycin 250 MG tablet Commonly known as:  ZITHROMAX Take 1 tablet (250 mg total) by mouth daily.   calcium carbonate 500 MG chewable tablet Commonly known as:  TUMS - dosed in mg elemental calcium Chew 2 tablets (400 mg of elemental calcium total) by mouth 2 (two) times daily with a meal.   calcium-vitamin D 500-200 MG-UNIT tablet Commonly known as:  OSCAL WITH D Take 1 tablet by mouth 2 (  two) times daily.   CERTAVITE SENIOR/ANTIOXIDANT Tabs Take 1 tablet by mouth daily.   insulin aspart protamine- aspart (70-30) 100 UNIT/ML injection Commonly known as:  NOVOLOG MIX 70/30 Inject 0.2 mLs (20 Units total) into the skin 2 (two) times daily with a meal.   metoprolol 100 MG tablet Commonly known as:  LOPRESSOR Take 100 mg by mouth 2 (two) times daily.   nystatin powder Generic drug:  nystatin Apply 1 g topically 2 (two) times daily.   ondansetron 4 MG disintegrating tablet Commonly known as:  ZOFRAN ODT Take 1 tablet (4 mg total) by mouth every 8 (eight) hours as needed for nausea or vomiting.   ranitidine 150 MG tablet Commonly known as:  ZANTAC Take 1 tablet (150 mg total) by mouth 2 (two) times daily.   sodium bicarbonate 650 MG tablet Take 1 tablet (650 mg total) by mouth 2 (two) times daily.   triamcinolone cream 0.1 % Commonly known as:  KENALOG Apply 1 application topically 3 (three) times daily. Apply to labia three time a day        Please see discharge summary for a list of discharge medications.  Relevant Imaging Results:  Relevant Lab Results:   Additional Information SSN: [id number]  Dedra Matsuo, Randall An, LCSW

## 2016-10-24 NOTE — Progress Notes (Signed)
Patient d/c to ALF via non emergency ambulance. Patient is stable.

## 2016-10-24 NOTE — Progress Notes (Signed)
Report given to Berneta Sages at [facility]. All questions answered appropriately.

## 2016-10-27 LAB — CULTURE, BLOOD (ROUTINE X 2)
CULTURE: NO GROWTH
Culture: NO GROWTH
Special Requests: ADEQUATE
Special Requests: ADEQUATE

## 2016-11-08 ENCOUNTER — Inpatient Hospital Stay (HOSPITAL_COMMUNITY)
Admission: EM | Admit: 2016-11-08 | Discharge: 2016-11-21 | DRG: 682 | Disposition: A | Discharge status: Home Health | Payer: Medicaid Other | Attending: Internal Medicine | Admitting: Internal Medicine

## 2016-11-08 ENCOUNTER — Encounter (HOSPITAL_COMMUNITY): Payer: Self-pay | Admitting: Emergency Medicine

## 2016-11-08 ENCOUNTER — Emergency Department (HOSPITAL_COMMUNITY): Payer: Medicaid Other

## 2016-11-08 DIAGNOSIS — E876 Hypokalemia: Secondary | ICD-10-CM | POA: Diagnosis not present

## 2016-11-08 DIAGNOSIS — Z803 Family history of malignant neoplasm of breast: Secondary | ICD-10-CM

## 2016-11-08 DIAGNOSIS — IMO0002 Reserved for concepts with insufficient information to code with codable children: Secondary | ICD-10-CM

## 2016-11-08 DIAGNOSIS — E1110 Type 2 diabetes mellitus with ketoacidosis without coma: Secondary | ICD-10-CM | POA: Diagnosis not present

## 2016-11-08 DIAGNOSIS — E860 Dehydration: Secondary | ICD-10-CM | POA: Diagnosis present

## 2016-11-08 DIAGNOSIS — N390 Urinary tract infection, site not specified: Secondary | ICD-10-CM

## 2016-11-08 DIAGNOSIS — N179 Acute kidney failure, unspecified: Secondary | ICD-10-CM

## 2016-11-08 DIAGNOSIS — R197 Diarrhea, unspecified: Secondary | ICD-10-CM | POA: Diagnosis present

## 2016-11-08 DIAGNOSIS — E1165 Type 2 diabetes mellitus with hyperglycemia: Secondary | ICD-10-CM

## 2016-11-08 DIAGNOSIS — G9341 Metabolic encephalopathy: Secondary | ICD-10-CM | POA: Diagnosis present

## 2016-11-08 DIAGNOSIS — Z8781 Personal history of (healed) traumatic fracture: Secondary | ICD-10-CM

## 2016-11-08 DIAGNOSIS — Z452 Encounter for adjustment and management of vascular access device: Secondary | ICD-10-CM

## 2016-11-08 DIAGNOSIS — M25512 Pain in left shoulder: Secondary | ICD-10-CM | POA: Diagnosis not present

## 2016-11-08 DIAGNOSIS — E873 Alkalosis: Secondary | ICD-10-CM | POA: Diagnosis not present

## 2016-11-08 DIAGNOSIS — T471X5A Adverse effect of other antacids and anti-gastric-secretion drugs, initial encounter: Secondary | ICD-10-CM | POA: Diagnosis present

## 2016-11-08 DIAGNOSIS — E1139 Type 2 diabetes mellitus with other diabetic ophthalmic complication: Secondary | ICD-10-CM | POA: Diagnosis present

## 2016-11-08 DIAGNOSIS — E118 Type 2 diabetes mellitus with unspecified complications: Secondary | ICD-10-CM

## 2016-11-08 DIAGNOSIS — R34 Anuria and oliguria: Secondary | ICD-10-CM | POA: Diagnosis present

## 2016-11-08 DIAGNOSIS — Z841 Family history of disorders of kidney and ureter: Secondary | ICD-10-CM

## 2016-11-08 DIAGNOSIS — E875 Hyperkalemia: Secondary | ICD-10-CM | POA: Diagnosis not present

## 2016-11-08 DIAGNOSIS — N3001 Acute cystitis with hematuria: Secondary | ICD-10-CM

## 2016-11-08 DIAGNOSIS — E1129 Type 2 diabetes mellitus with other diabetic kidney complication: Secondary | ICD-10-CM | POA: Diagnosis present

## 2016-11-08 DIAGNOSIS — E8770 Fluid overload, unspecified: Secondary | ICD-10-CM | POA: Diagnosis not present

## 2016-11-08 DIAGNOSIS — Z87440 Personal history of urinary (tract) infections: Secondary | ICD-10-CM

## 2016-11-08 DIAGNOSIS — F17210 Nicotine dependence, cigarettes, uncomplicated: Secondary | ICD-10-CM | POA: Diagnosis present

## 2016-11-08 DIAGNOSIS — K5900 Constipation, unspecified: Secondary | ICD-10-CM | POA: Diagnosis present

## 2016-11-08 DIAGNOSIS — D631 Anemia in chronic kidney disease: Secondary | ICD-10-CM | POA: Diagnosis present

## 2016-11-08 DIAGNOSIS — Z823 Family history of stroke: Secondary | ICD-10-CM

## 2016-11-08 DIAGNOSIS — Z885 Allergy status to narcotic agent status: Secondary | ICD-10-CM

## 2016-11-08 DIAGNOSIS — Z8249 Family history of ischemic heart disease and other diseases of the circulatory system: Secondary | ICD-10-CM

## 2016-11-08 DIAGNOSIS — N184 Chronic kidney disease, stage 4 (severe): Secondary | ICD-10-CM | POA: Diagnosis present

## 2016-11-08 DIAGNOSIS — N39490 Overflow incontinence: Secondary | ICD-10-CM | POA: Diagnosis present

## 2016-11-08 DIAGNOSIS — E871 Hypo-osmolality and hyponatremia: Secondary | ICD-10-CM

## 2016-11-08 DIAGNOSIS — Z7982 Long term (current) use of aspirin: Secondary | ICD-10-CM

## 2016-11-08 DIAGNOSIS — Y92099 Unspecified place in other non-institutional residence as the place of occurrence of the external cause: Secondary | ICD-10-CM

## 2016-11-08 DIAGNOSIS — Z794 Long term (current) use of insulin: Secondary | ICD-10-CM

## 2016-11-08 DIAGNOSIS — H548 Legal blindness, as defined in USA: Secondary | ICD-10-CM | POA: Diagnosis present

## 2016-11-08 DIAGNOSIS — E1122 Type 2 diabetes mellitus with diabetic chronic kidney disease: Secondary | ICD-10-CM | POA: Diagnosis present

## 2016-11-08 DIAGNOSIS — I10 Essential (primary) hypertension: Secondary | ICD-10-CM

## 2016-11-08 DIAGNOSIS — Z87442 Personal history of urinary calculi: Secondary | ICD-10-CM

## 2016-11-08 DIAGNOSIS — R109 Unspecified abdominal pain: Secondary | ICD-10-CM | POA: Diagnosis present

## 2016-11-08 DIAGNOSIS — H3320 Serous retinal detachment, unspecified eye: Secondary | ICD-10-CM | POA: Diagnosis present

## 2016-11-08 DIAGNOSIS — Z8673 Personal history of transient ischemic attack (TIA), and cerebral infarction without residual deficits: Secondary | ICD-10-CM

## 2016-11-08 DIAGNOSIS — M12819 Other specific arthropathies, not elsewhere classified, unspecified shoulder: Secondary | ICD-10-CM

## 2016-11-08 DIAGNOSIS — K219 Gastro-esophageal reflux disease without esophagitis: Secondary | ICD-10-CM | POA: Diagnosis present

## 2016-11-08 DIAGNOSIS — I129 Hypertensive chronic kidney disease with stage 1 through stage 4 chronic kidney disease, or unspecified chronic kidney disease: Secondary | ICD-10-CM | POA: Diagnosis present

## 2016-11-08 DIAGNOSIS — R8271 Bacteriuria: Secondary | ICD-10-CM | POA: Diagnosis present

## 2016-11-08 DIAGNOSIS — E785 Hyperlipidemia, unspecified: Secondary | ICD-10-CM | POA: Diagnosis present

## 2016-11-08 DIAGNOSIS — Z833 Family history of diabetes mellitus: Secondary | ICD-10-CM

## 2016-11-08 DIAGNOSIS — E861 Hypovolemia: Secondary | ICD-10-CM

## 2016-11-08 LAB — I-STAT CHEM 8, ED
BUN: 65 mg/dL — ABNORMAL HIGH (ref 6–20)
BUN: 77 mg/dL — ABNORMAL HIGH (ref 6–20)
CALCIUM ION: 1.71 mmol/L — AB (ref 1.15–1.40)
CHLORIDE: 71 mmol/L — AB (ref 101–111)
Calcium, Ion: 1.32 mmol/L (ref 1.15–1.40)
Chloride: 92 mmol/L — ABNORMAL LOW (ref 101–111)
Creatinine, Ser: 6.2 mg/dL — ABNORMAL HIGH (ref 0.44–1.00)
Creatinine, Ser: 9.5 mg/dL — ABNORMAL HIGH (ref 0.44–1.00)
GLUCOSE: 140 mg/dL — AB (ref 65–99)
Glucose, Bld: 99 mg/dL (ref 65–99)
HCT: 38 % (ref 36.0–46.0)
HEMATOCRIT: 22 % — AB (ref 36.0–46.0)
Hemoglobin: 12.9 g/dL (ref 12.0–15.0)
Hemoglobin: 7.5 g/dL — ABNORMAL LOW (ref 12.0–15.0)
POTASSIUM: 4.7 mmol/L (ref 3.5–5.1)
Potassium: 3.7 mmol/L (ref 3.5–5.1)
SODIUM: 116 mmol/L — AB (ref 135–145)
Sodium: 128 mmol/L — ABNORMAL LOW (ref 135–145)
TCO2: 29 mmol/L (ref 0–100)
TCO2: 42 mmol/L (ref 0–100)

## 2016-11-08 LAB — CBC WITH DIFFERENTIAL/PLATELET
BASOS ABS: 0 10*3/uL (ref 0.0–0.1)
Basophils Relative: 0 %
EOS PCT: 1 %
Eosinophils Absolute: 0.1 10*3/uL (ref 0.0–0.7)
HCT: 33 % — ABNORMAL LOW (ref 36.0–46.0)
HEMOGLOBIN: 12 g/dL (ref 12.0–15.0)
LYMPHS PCT: 17 %
Lymphs Abs: 1.8 10*3/uL (ref 0.7–4.0)
MCH: 30.4 pg (ref 26.0–34.0)
MCHC: 36.4 g/dL — ABNORMAL HIGH (ref 30.0–36.0)
MCV: 83.5 fL (ref 78.0–100.0)
Monocytes Absolute: 0.7 10*3/uL (ref 0.1–1.0)
Monocytes Relative: 7 %
NEUTROS ABS: 8.1 10*3/uL — AB (ref 1.7–7.7)
NEUTROS PCT: 75 %
PLATELETS: 335 10*3/uL (ref 150–400)
RBC: 3.95 MIL/uL (ref 3.87–5.11)
RDW: 12.6 % (ref 11.5–15.5)
WBC: 10.7 10*3/uL — AB (ref 4.0–10.5)

## 2016-11-08 LAB — I-STAT TROPONIN, ED: Troponin i, poc: 0.01 ng/mL (ref 0.00–0.08)

## 2016-11-08 LAB — CBG MONITORING, ED: Glucose-Capillary: 134 mg/dL — ABNORMAL HIGH (ref 65–99)

## 2016-11-08 MED ORDER — ONDANSETRON HCL 4 MG/2ML IJ SOLN
4.0000 mg | Freq: Once | INTRAMUSCULAR | Status: AC
  Administered 2016-11-08: 4 mg via INTRAVENOUS
  Filled 2016-11-08: qty 2

## 2016-11-08 MED ORDER — IOPAMIDOL (ISOVUE-300) INJECTION 61%
INTRAVENOUS | Status: AC
  Filled 2016-11-08: qty 30

## 2016-11-08 MED ORDER — IOPAMIDOL (ISOVUE-300) INJECTION 61%
15.0000 mL | Freq: Once | INTRAVENOUS | Status: AC | PRN
  Administered 2016-11-08: 15 mL via ORAL

## 2016-11-08 MED ORDER — SODIUM CHLORIDE 0.9 % IV SOLN
Freq: Once | INTRAVENOUS | Status: AC
  Administered 2016-11-09: via INTRAVENOUS

## 2016-11-08 NOTE — ED Notes (Signed)
Bed: WA17 Expected date:  Expected time:  Means of arrival:  Comments: f n/v

## 2016-11-08 NOTE — ED Provider Notes (Signed)
[facility] DEPT Provider Note   CSN: [id number] Arrival date & time: 11/08/16  1953     History   Chief Complaint Chief Complaint  Patient presents with  . Abdominal Pain    HPI DRUSCILLA PETSCH is a 56 y.o. female.  The history is provided by the patient. No language interpreter was used.  Abdominal Pain     KASIE LECCESE is a 56 y.o. female who presents to the Emergency Department complaining of abdominal pain.  She presents for evaluation of 2 days of diffuse body pain, primarily in her abdomen. She has associated vomiting, every hour as well as multiple episodes of diarrhea. No reports of fevers, dysuria. She was recently in the hospital for urinary tract infection and kidney stone. Symptoms are moderate, constant, worsening. Past Medical History:  Diagnosis Date  . Diabetes mellitus   . Hyperlipidemia   . Hypertension   . Stroke [facility]) 04-01-11   left frontal subcortical, saw Dr. [name]   . TIA (transient ischemic attack) 03-12-11    Patient Active Problem List   Diagnosis Date Noted  . Nephrolithiasis 10/24/2016  . Constipation 10/22/2016  . CAP (community acquired pneumonia) 10/22/2016  . Hydronephrosis of right kidney 10/22/2016  . Metabolic acidosis 36/64/4034  . CVA (cerebral vascular accident) ([place]) 07/03/2016  . HAP (hospital-acquired pneumonia) 07/03/2016  . Sepsis secondary to UTI ([place])   . UTI due to extended-spectrum beta lactamase (ESBL) producing Escherichia coli   . Acute pyelonephritis   . Bacteremia due to Escherichia coli   . Colitis, indeterminate   . Uncontrolled type 2 diabetes mellitus with complication ([place])   . 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 associated with diabetes mellitus due to underlying condition ([place])   . Chronic diastolic CHF (congestive heart failure) ([place])   . Abdominal pain   . Colitis   . Hypophosphatemia 06/12/2016  . Hypokalemia 06/11/2016  . Hypocalcemia 06/11/2016  . Acute kidney injury  superimposed on chronic kidney disease ([place]) 06/11/2016  . Proliferative diabetic retinopathy ([place]) 04/29/2016  . Closed fracture of left distal radius 01/28/2016  . Poor social situation 03/09/2013  . Depression 03/01/2013  . Abnormal mammogram 12/20/2012  . Retinal detachment 11/17/2012  . 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 ([place]) 74/25/9563  . Hyperlipidemia 02/09/2007  . Essential hypertension 02/09/2007  . GERD 02/09/2007    Past Surgical History:  Procedure Laterality Date  . OPEN REDUCTION INTERNAL FIXATION (ORIF) DISTAL RADIAL FRACTURE Left 01/28/2016   Procedure: OPEN REDUCTION INTERNAL FIXATION (ORIF) DISTAL RADIAL FRACTURE;  Surgeon: Iran Planas, MD;  Location: [facility];  Service: Orthopedics;  Laterality: Left;  . [place]     age 85    OB History    No data available       Home Medications    Prior to Admission medications   Medication Sig Start Date End Date Taking? Authorizing Provider  aspirin 81 MG tablet Take 1 tablet (81 mg total) by mouth daily. 08/24/12  Yes Reyne Dumas, MD  atorvastatin (LIPITOR) 20 MG tablet Take 1 tablet (20 mg total) by mouth daily. 04/29/16  Yes Arnoldo Morale, MD  calcium-vitamin D (OSCAL WITH D) 500-200 MG-UNIT tablet Take 1 tablet by mouth 2 (two) times daily.   Yes [provider]  insulin aspart protamine- aspart (NOVOLOG MIX 70/30) (70-30) 100 UNIT/ML injection Inject 0.2 mLs (20 Units total) into the skin 2 (two)  times daily with a meal. 06/30/16  Yes Patrecia Pour, MD  linagliptin (TRADJENTA) 5 MG TABS tablet Take 5 mg by mouth daily.   Yes [provider]  metoprolol (LOPRESSOR) 100 MG tablet Take 100 mg by mouth 2 (two) times daily.   Yes [provider]  Multiple Vitamins-Minerals (CERTAVITE SENIOR/ANTIOXIDANT) TABS Take 1 tablet by mouth daily.   Yes [provider]  nystatin (NYSTATIN) powder  Apply 1 g topically 2 (two) times daily.   Yes [provider]  omeprazole (PRILOSEC) 40 MG capsule Take 40 mg by mouth daily.   Yes [provider]  ondansetron (ZOFRAN ODT) 4 MG disintegrating tablet Take 1 tablet (4 mg total) by mouth every 8 (eight) hours as needed for nausea or vomiting. 10/14/16  Yes Street, [place], PA-C  ranitidine (ZANTAC) 150 MG tablet Take 1 tablet (150 mg total) by mouth 2 (two) times daily. 10/14/16  Yes Street, [place], PA-C  Saccharomyces boulardii (PROBIOTIC) 250 MG CAPS Take 1 capsule by mouth 2 (two) times daily.   Yes [provider]  sodium bicarbonate 650 MG tablet Take 1 tablet (650 mg total) by mouth 2 (two) times daily. 06/30/16  Yes Patrecia Pour, MD  triamcinolone cream (KENALOG) 0.1 % Apply 1 application topically 3 (three) times daily. Apply to labia three time a day   Yes [provider]  calcium carbonate (TUMS - DOSED IN MG ELEMENTAL CALCIUM) 500 MG chewable tablet Chew 2 tablets (400 mg of elemental calcium total) by mouth 2 (two) times daily with a meal. Patient not taking: Reported on 10/14/2016 06/30/16   Patrecia Pour, MD    Family History Family History  Problem Relation Age of Onset  . Stroke Mother   . Diabetes Mother   . Kidney failure Mother   . Heart failure Mother   . Stroke Father   . Cancer Sister        Breast- 61's    Social History Social History  Substance Use Topics  . Smoking status: Current Every Day Smoker    Packs/day: 1.00    Years: 32.00    Types: Cigarettes    Last attempt to quit: 09/23/2012  . Smokeless tobacco: Never Used     Comment: or less  . Alcohol use No     Allergies   Hydrocodone   Review of Systems Review of Systems  Gastrointestinal: Positive for abdominal pain.  All other systems reviewed and are negative.    Physical Exam Updated Vital Signs BP (!) 164/89 (BP Location: Right Arm)   Pulse 76   Temp 97.8 F (36.6 C) (Oral)   Resp 13   Ht 4\' 6"   (1.372 m)   Wt 88 lb (39.9 kg)   LMP 03/06/2013   SpO2 100%   BMI 21.22 kg/m   Physical Exam  Constitutional: She is oriented to person, place, and time. She appears well-developed and well-nourished.  Short stature  HENT:  Head: Normocephalic and atraumatic.  Cardiovascular: Normal rate and regular rhythm.   No murmur heard. Pulmonary/Chest: Effort normal and breath sounds normal. No respiratory distress.  Abdominal: Soft. There is no rebound and no guarding.  Mild epigastric tenderness  Musculoskeletal: She exhibits no edema or tenderness.  Neurological: She is alert and oriented to person, place, and time.  Slightly slow to answer questions. 5 out of 5 strength in all 4 extremities. Seems mildly confused.  Skin: Skin is warm and dry.  Psychiatric: She has a normal  mood and affect. Her behavior is normal.  Nursing note and vitals reviewed.    ED Treatments / Results  Labs (all labs ordered are listed, but only abnormal results are displayed) Labs Reviewed  CBC WITH DIFFERENTIAL/PLATELET - Abnormal; Notable for the following:       Result Value   WBC 10.7 (*)    HCT 33.0 (*)    MCHC 36.4 (*)    Neutro Abs 8.1 (*)    All other components within normal limits  CBG MONITORING, ED - Abnormal; Notable for the following:    Glucose-Capillary 134 (*)    All other components within normal limits  I-STAT CHEM 8, ED - Abnormal; Notable for the following:    Sodium 128 (*)    Chloride 92 (*)    BUN 65 (*)    Creatinine, Ser 6.20 (*)    Hemoglobin 7.5 (*)    HCT 22.0 (*)    All other components within normal limits  I-STAT CHEM 8, ED - Abnormal; Notable for the following:    Sodium 116 (*)    Chloride 71 (*)    BUN 77 (*)    Creatinine, Ser 9.50 (*)    Glucose, Bld 140 (*)    Calcium, Ion 1.71 (*)    All other components within normal limits  CULTURE, BLOOD (ROUTINE X 2)  CULTURE, BLOOD (ROUTINE X 2)  URINE CULTURE  COMPREHENSIVE METABOLIC PANEL  LIPASE, BLOOD    I-STAT TROPOININ, ED  I-STAT CG4 LACTIC ACID, ED    EKG  EKG Interpretation  Date/Time:  Saturday Nov 08 2016 20:57:38 EDT Ventricular Rate:  77 PR Interval:    QRS Duration: 100 QT Interval:  413 QTC Calculation: 468 R Axis:   84 Text Interpretation:  Sinus rhythm Confirmed by Hazle Coca [id number]) on 11/08/2016 8:59:36 PM       Radiology Dg Abd Acute W/chest  Result Date: 11/08/2016 CLINICAL DATA:  Abdominal pain and vomiting.  Somnolence. EXAM: DG ABDOMEN ACUTE W/ 1V CHEST COMPARISON:  10/22/2016 CT abdomen and pelvis FINDINGS: No bowel obstruction. Increased colonic stool burden noted from cecum through rectum. Faint linear calcification may correspond with the known right UVJ stone versus stool particle noted in the right hemipelvis.Heart size and mediastinal contours are within normal limits. Minimal atelectasis at the lung bases. Faint opacity at the left costophrenic angle on the frontal view of the chest does not persist on additional imaging and may be due to overlap of the ribs and breast tissue. IMPRESSION: No acute cardiopulmonary disease. Increased colonic stool burden consistent with constipation. There is a linear tiny calcification in the right hemipelvis which could potentially represent the known right UVJ stone, vascular calcification or tiny focus of stool within overlying bowel. Electronically Signed   By: Ashley Royalty M.D.   On: 11/08/2016 22:21    Procedures Procedures (including critical care time) CRITICAL CARE Performed by: Quintella Reichert   Total critical care time: 40 minutes  Critical care time was exclusive of separately billable procedures and treating other patients.  Critical care was necessary to treat or prevent imminent or life-threatening deterioration.  Critical care was time spent personally by me on the following activities: development of treatment plan with patient and/or surrogate as well as nursing, discussions with consultants, evaluation  of patient's response to treatment, examination of patient, obtaining history from patient or surrogate, ordering and performing treatments and interventions, ordering and review of laboratory studies, ordering and review of radiographic studies, pulse oximetry  and re-evaluation of patient's condition.  Medications Ordered in ED Medications  iopamidol (ISOVUE-300) 61 % injection (not administered)  ondansetron (ZOFRAN) injection 4 mg (4 mg Intravenous Given 11/08/16 2108)  iopamidol (ISOVUE-300) 61 % injection 15 mL (15 mLs Oral Contrast Given 11/08/16 2350)  0.9 %  sodium chloride infusion ( Intravenous New Bag/Given 11/09/16 0021)     Initial Impression / Assessment and Plan / ED Course  I have reviewed the triage vital signs and the nursing notes.  Pertinent labs & imaging results that were available during my care of the patient were reviewed by me and considered in my medical decision making (see chart for details).    Pt with hx/o CKD, recent admission for UTI with renal stone here with V/D for last two days, body aches.  Pt mildly confused on exam with mild epigastric tenderness.  Difficulty with lab system - unable to obtain immediate CMP, istat chem 8 obtained - initial with markedly abnormal results with hgb significantly low compared to CBC in lab.  Repeat istat chem 8 significantly different from initial.  Sending additional CMP to lab for re-run.  Concern for significant dehydration with possible hyponatremia, renal failure - starting on gentle IVF hydration.  Pt care transferred pending repeat labs, CT abd.    Final Clinical Impressions(s) / ED Diagnoses   Final diagnoses:  None    New Prescriptions New Prescriptions   No medications on file     Quintella Reichert, MD 11/09/16 1445

## 2016-11-08 NOTE — ED Triage Notes (Signed)
Pt comes to ed, via ems, from [facility]. C/o NVD x1 week. Pt is alert x4 ( but tired)  Lack of sleep verbalized. 22 in rac. V/s on 120/80, sp02 95 2 liters, CBG 148. Pt has abdominal distention, and pain score of 10 out 10. Pt has just here for UTI and kidney stones. Finished medications 48 hrs ago.

## 2016-11-09 ENCOUNTER — Encounter (HOSPITAL_COMMUNITY): Payer: Self-pay

## 2016-11-09 DIAGNOSIS — I10 Essential (primary) hypertension: Secondary | ICD-10-CM

## 2016-11-09 DIAGNOSIS — E1129 Type 2 diabetes mellitus with other diabetic kidney complication: Secondary | ICD-10-CM | POA: Diagnosis not present

## 2016-11-09 DIAGNOSIS — Z8781 Personal history of (healed) traumatic fracture: Secondary | ICD-10-CM | POA: Diagnosis not present

## 2016-11-09 DIAGNOSIS — E876 Hypokalemia: Secondary | ICD-10-CM | POA: Diagnosis not present

## 2016-11-09 DIAGNOSIS — E785 Hyperlipidemia, unspecified: Secondary | ICD-10-CM | POA: Diagnosis present

## 2016-11-09 DIAGNOSIS — Z794 Long term (current) use of insulin: Secondary | ICD-10-CM | POA: Diagnosis not present

## 2016-11-09 DIAGNOSIS — N179 Acute kidney failure, unspecified: Secondary | ICD-10-CM | POA: Diagnosis present

## 2016-11-09 DIAGNOSIS — E1139 Type 2 diabetes mellitus with other diabetic ophthalmic complication: Secondary | ICD-10-CM | POA: Diagnosis present

## 2016-11-09 DIAGNOSIS — E861 Hypovolemia: Secondary | ICD-10-CM

## 2016-11-09 DIAGNOSIS — E1010 Type 1 diabetes mellitus with ketoacidosis without coma: Secondary | ICD-10-CM | POA: Diagnosis not present

## 2016-11-09 DIAGNOSIS — G9341 Metabolic encephalopathy: Secondary | ICD-10-CM | POA: Diagnosis present

## 2016-11-09 DIAGNOSIS — R8271 Bacteriuria: Secondary | ICD-10-CM | POA: Diagnosis not present

## 2016-11-09 DIAGNOSIS — H548 Legal blindness, as defined in USA: Secondary | ICD-10-CM | POA: Diagnosis present

## 2016-11-09 DIAGNOSIS — N184 Chronic kidney disease, stage 4 (severe): Secondary | ICD-10-CM | POA: Diagnosis present

## 2016-11-09 DIAGNOSIS — N39490 Overflow incontinence: Secondary | ICD-10-CM | POA: Diagnosis present

## 2016-11-09 DIAGNOSIS — D631 Anemia in chronic kidney disease: Secondary | ICD-10-CM | POA: Diagnosis present

## 2016-11-09 DIAGNOSIS — E1165 Type 2 diabetes mellitus with hyperglycemia: Secondary | ICD-10-CM | POA: Diagnosis not present

## 2016-11-09 DIAGNOSIS — E871 Hypo-osmolality and hyponatremia: Secondary | ICD-10-CM | POA: Diagnosis present

## 2016-11-09 DIAGNOSIS — Y92099 Unspecified place in other non-institutional residence as the place of occurrence of the external cause: Secondary | ICD-10-CM | POA: Diagnosis not present

## 2016-11-09 DIAGNOSIS — E873 Alkalosis: Secondary | ICD-10-CM | POA: Diagnosis not present

## 2016-11-09 DIAGNOSIS — E1110 Type 2 diabetes mellitus with ketoacidosis without coma: Secondary | ICD-10-CM | POA: Diagnosis not present

## 2016-11-09 DIAGNOSIS — K5900 Constipation, unspecified: Secondary | ICD-10-CM | POA: Diagnosis present

## 2016-11-09 DIAGNOSIS — H3320 Serous retinal detachment, unspecified eye: Secondary | ICD-10-CM | POA: Diagnosis present

## 2016-11-09 DIAGNOSIS — N390 Urinary tract infection, site not specified: Secondary | ICD-10-CM

## 2016-11-09 DIAGNOSIS — I129 Hypertensive chronic kidney disease with stage 1 through stage 4 chronic kidney disease, or unspecified chronic kidney disease: Secondary | ICD-10-CM | POA: Diagnosis present

## 2016-11-09 DIAGNOSIS — F17210 Nicotine dependence, cigarettes, uncomplicated: Secondary | ICD-10-CM | POA: Diagnosis present

## 2016-11-09 DIAGNOSIS — E860 Dehydration: Secondary | ICD-10-CM | POA: Diagnosis present

## 2016-11-09 DIAGNOSIS — E8770 Fluid overload, unspecified: Secondary | ICD-10-CM | POA: Diagnosis not present

## 2016-11-09 DIAGNOSIS — K219 Gastro-esophageal reflux disease without esophagitis: Secondary | ICD-10-CM | POA: Diagnosis present

## 2016-11-09 DIAGNOSIS — E1122 Type 2 diabetes mellitus with diabetic chronic kidney disease: Secondary | ICD-10-CM | POA: Diagnosis present

## 2016-11-09 DIAGNOSIS — Z885 Allergy status to narcotic agent status: Secondary | ICD-10-CM | POA: Diagnosis not present

## 2016-11-09 LAB — RENAL FUNCTION PANEL
ANION GAP: 11 (ref 5–15)
Albumin: 2.9 g/dL — ABNORMAL LOW (ref 3.5–5.0)
Albumin: 3.2 g/dL — ABNORMAL LOW (ref 3.5–5.0)
Anion gap: 12 (ref 5–15)
BUN: 65 mg/dL — ABNORMAL HIGH (ref 6–20)
BUN: 64 mg/dL — AB (ref 6–20)
CALCIUM: 12.1 mg/dL — AB (ref 8.9–10.3)
CALCIUM: 12 mg/dL — AB (ref 8.9–10.3)
CO2: 28 mmol/L (ref 22–32)
CO2: 25 mmol/L (ref 22–32)
CREATININE: 8 mg/dL — AB (ref 0.44–1.00)
Chloride: 87 mmol/L — ABNORMAL LOW (ref 101–111)
Chloride: 89 mmol/L — ABNORMAL LOW (ref 101–111)
Creatinine, Ser: 7.69 mg/dL — ABNORMAL HIGH (ref 0.44–1.00)
GFR calc Af Amer: 6 mL/min — ABNORMAL LOW (ref >60)
GFR, EST AFRICAN AMERICAN: 6 mL/min — AB (ref >60)
GFR, EST NON AFRICAN AMERICAN: 5 mL/min — AB (ref >60)
GFR, EST NON AFRICAN AMERICAN: 5 mL/min — AB (ref >60)
Glucose, Bld: 130 mg/dL — ABNORMAL HIGH (ref 65–99)
Glucose, Bld: 156 mg/dL — ABNORMAL HIGH (ref 65–99)
PHOSPHORUS: 5.4 mg/dL — AB (ref 2.5–4.6)
POTASSIUM: 5.1 mmol/L (ref 3.5–5.1)
Phosphorus: 6.2 mg/dL — ABNORMAL HIGH (ref 2.5–4.6)
Potassium: 4.7 mmol/L (ref 3.5–5.1)
SODIUM: 126 mmol/L — AB (ref 135–145)
Sodium: 126 mmol/L — ABNORMAL LOW (ref 135–145)

## 2016-11-09 LAB — URINALYSIS, ROUTINE W REFLEX MICROSCOPIC
Bilirubin Urine: NEGATIVE
GLUCOSE, UA: 50 mg/dL — AB
KETONES UR: NEGATIVE mg/dL
Nitrite: NEGATIVE
PROTEIN: NEGATIVE mg/dL
Specific Gravity, Urine: 1.005 (ref 1.005–1.030)
pH: 9 — ABNORMAL HIGH (ref 5.0–8.0)

## 2016-11-09 LAB — OSMOLALITY: Osmolality: 298 mOsm/kg — ABNORMAL HIGH (ref 275–295)

## 2016-11-09 LAB — CBC WITH DIFFERENTIAL/PLATELET
BASOS ABS: 0 10*3/uL (ref 0.0–0.1)
BASOS PCT: 0 %
Eosinophils Absolute: 0.2 10*3/uL (ref 0.0–0.7)
Eosinophils Relative: 1 %
HEMATOCRIT: 33.3 % — AB (ref 36.0–46.0)
Hemoglobin: 11.5 g/dL — ABNORMAL LOW (ref 12.0–15.0)
LYMPHS PCT: 11 %
Lymphs Abs: 1.2 10*3/uL (ref 0.7–4.0)
MCH: 29.3 pg (ref 26.0–34.0)
MCHC: 34.5 g/dL (ref 30.0–36.0)
MCV: 84.7 fL (ref 78.0–100.0)
Monocytes Absolute: 0.7 10*3/uL (ref 0.1–1.0)
Monocytes Relative: 6 %
NEUTROS ABS: 8.4 10*3/uL — AB (ref 1.7–7.7)
NEUTROS PCT: 82 %
Platelets: 363 10*3/uL (ref 150–400)
RBC: 3.93 MIL/uL (ref 3.87–5.11)
RDW: 12.4 % (ref 11.5–15.5)
WBC: 10.4 10*3/uL (ref 4.0–10.5)

## 2016-11-09 LAB — TSH: TSH: 2.001 u[IU]/mL (ref 0.350–4.500)

## 2016-11-09 LAB — BASIC METABOLIC PANEL
ANION GAP: 12 (ref 5–15)
ANION GAP: 14 (ref 5–15)
BUN: 74 mg/dL — AB (ref 6–20)
BUN: 66 mg/dL — ABNORMAL HIGH (ref 6–20)
CALCIUM: 13.3 mg/dL — AB (ref 8.9–10.3)
CALCIUM: 12.5 mg/dL — AB (ref 8.9–10.3)
CO2: 34 mmol/L — ABNORMAL HIGH (ref 22–32)
CO2: 27 mmol/L (ref 22–32)
Chloride: 75 mmol/L — ABNORMAL LOW (ref 101–111)
Chloride: 82 mmol/L — ABNORMAL LOW (ref 101–111)
Creatinine, Ser: 8.41 mg/dL — ABNORMAL HIGH (ref 0.44–1.00)
Creatinine, Ser: 7.71 mg/dL — ABNORMAL HIGH (ref 0.44–1.00)
GFR calc Af Amer: 5 mL/min — ABNORMAL LOW (ref >60)
GFR, EST AFRICAN AMERICAN: 6 mL/min — AB (ref >60)
GFR, EST NON AFRICAN AMERICAN: 5 mL/min — AB (ref >60)
GFR, EST NON AFRICAN AMERICAN: 5 mL/min — AB (ref >60)
GLUCOSE: 124 mg/dL — AB (ref 65–99)
Glucose, Bld: 127 mg/dL — ABNORMAL HIGH (ref 65–99)
POTASSIUM: 4.8 mmol/L (ref 3.5–5.1)
Potassium: 4.5 mmol/L (ref 3.5–5.1)
SODIUM: 123 mmol/L — AB (ref 135–145)
Sodium: 121 mmol/L — ABNORMAL LOW (ref 135–145)

## 2016-11-09 LAB — HEPATIC FUNCTION PANEL
ALBUMIN: 3.1 g/dL — AB (ref 3.5–5.0)
ALT: 15 U/L (ref 14–54)
AST: 18 U/L (ref 15–41)
Alkaline Phosphatase: 62 U/L (ref 38–126)
BILIRUBIN TOTAL: 0.7 mg/dL (ref 0.3–1.2)
Total Protein: 7.1 g/dL (ref 6.5–8.1)

## 2016-11-09 LAB — GLUCOSE, CAPILLARY
GLUCOSE-CAPILLARY: 137 mg/dL — AB (ref 65–99)
GLUCOSE-CAPILLARY: 121 mg/dL — AB (ref 65–99)
Glucose-Capillary: 136 mg/dL — ABNORMAL HIGH (ref 65–99)
Glucose-Capillary: 203 mg/dL — ABNORMAL HIGH (ref 65–99)

## 2016-11-09 LAB — PROTIME-INR
INR: 1.01
PROTHROMBIN TIME: 13.3 s (ref 11.4–15.2)

## 2016-11-09 LAB — PHOSPHORUS: PHOSPHORUS: 5.2 mg/dL — AB (ref 2.5–4.6)

## 2016-11-09 LAB — I-STAT CG4 LACTIC ACID, ED: LACTIC ACID, VENOUS: 1.44 mmol/L (ref 0.5–1.9)

## 2016-11-09 MED ORDER — TRIAMCINOLONE ACETONIDE 0.1 % EX CREA
1.0000 "application " | TOPICAL_CREAM | Freq: Three times a day (TID) | CUTANEOUS | Status: DC
  Administered 2016-11-09 – 2016-11-21 (×26): 1 via TOPICAL
  Filled 2016-11-09 (×3): qty 15

## 2016-11-09 MED ORDER — POLYETHYLENE GLYCOL 3350 17 G PO PACK
17.0000 g | PACK | Freq: Every day | ORAL | Status: DC
  Administered 2016-11-09 – 2016-11-20 (×9): 17 g via ORAL
  Filled 2016-11-09 (×12): qty 1

## 2016-11-09 MED ORDER — FUROSEMIDE 10 MG/ML IJ SOLN
40.0000 mg | Freq: Two times a day (BID) | INTRAMUSCULAR | Status: DC
  Administered 2016-11-09 – 2016-11-12 (×6): 40 mg via INTRAVENOUS
  Filled 2016-11-09 (×6): qty 4

## 2016-11-09 MED ORDER — ASPIRIN 81 MG PO CHEW
81.0000 mg | CHEWABLE_TABLET | Freq: Every day | ORAL | Status: DC
  Administered 2016-11-09 – 2016-11-21 (×13): 81 mg via ORAL
  Filled 2016-11-09 (×13): qty 1

## 2016-11-09 MED ORDER — FAMOTIDINE 20 MG PO TABS
20.0000 mg | ORAL_TABLET | Freq: Every day | ORAL | Status: DC
  Administered 2016-11-10 – 2016-11-21 (×12): 20 mg via ORAL
  Filled 2016-11-09 (×12): qty 1

## 2016-11-09 MED ORDER — INSULIN ASPART 100 UNIT/ML ~~LOC~~ SOLN
0.0000 [IU] | Freq: Three times a day (TID) | SUBCUTANEOUS | Status: DC
  Administered 2016-11-09 (×3): 1 [IU] via SUBCUTANEOUS
  Administered 2016-11-10: 5 [IU] via SUBCUTANEOUS
  Administered 2016-11-10 – 2016-11-11 (×2): 2 [IU] via SUBCUTANEOUS
  Administered 2016-11-11: 3 [IU] via SUBCUTANEOUS
  Administered 2016-11-11 – 2016-11-12 (×2): 2 [IU] via SUBCUTANEOUS
  Administered 2016-11-12: 1 [IU] via SUBCUTANEOUS
  Administered 2016-11-13: 3 [IU] via SUBCUTANEOUS
  Administered 2016-11-13: 1 [IU] via SUBCUTANEOUS
  Administered 2016-11-13: 5 [IU] via SUBCUTANEOUS
  Administered 2016-11-14: 2 [IU] via SUBCUTANEOUS
  Administered 2016-11-14 – 2016-11-15 (×2): 9 [IU] via SUBCUTANEOUS
  Administered 2016-11-15: 5 [IU] via SUBCUTANEOUS
  Administered 2016-11-15: 2 [IU] via SUBCUTANEOUS
  Administered 2016-11-16: 20 [IU] via SUBCUTANEOUS
  Administered 2016-11-16: 5 [IU] via SUBCUTANEOUS
  Administered 2016-11-16: 9 [IU] via SUBCUTANEOUS
  Administered 2016-11-17 (×2): 5 [IU] via SUBCUTANEOUS

## 2016-11-09 MED ORDER — SODIUM CHLORIDE 0.9 % IV BOLUS (SEPSIS)
1000.0000 mL | Freq: Once | INTRAVENOUS | Status: AC
  Administered 2016-11-09: 1000 mL via INTRAVENOUS

## 2016-11-09 MED ORDER — ONDANSETRON HCL 4 MG/2ML IJ SOLN
4.0000 mg | Freq: Four times a day (QID) | INTRAMUSCULAR | Status: DC | PRN
  Administered 2016-11-09 – 2016-11-12 (×6): 4 mg via INTRAVENOUS
  Filled 2016-11-09 (×6): qty 2

## 2016-11-09 MED ORDER — ACETAMINOPHEN 325 MG PO TABS
650.0000 mg | ORAL_TABLET | Freq: Four times a day (QID) | ORAL | Status: DC | PRN
  Administered 2016-11-14 – 2016-11-21 (×7): 650 mg via ORAL
  Filled 2016-11-09 (×7): qty 2

## 2016-11-09 MED ORDER — SODIUM CHLORIDE 0.9% FLUSH
3.0000 mL | Freq: Two times a day (BID) | INTRAVENOUS | Status: DC
  Administered 2016-11-09 – 2016-11-21 (×15): 3 mL via INTRAVENOUS

## 2016-11-09 MED ORDER — DEXTROSE 5 % IV SOLN
500.0000 mg | INTRAVENOUS | Status: DC
  Administered 2016-11-09: 500 mg via INTRAVENOUS
  Filled 2016-11-09 (×3): qty 0.5

## 2016-11-09 MED ORDER — METOPROLOL TARTRATE 100 MG PO TABS
100.0000 mg | ORAL_TABLET | Freq: Two times a day (BID) | ORAL | Status: DC
  Administered 2016-11-09 – 2016-11-21 (×24): 100 mg via ORAL
  Filled 2016-11-09: qty 4
  Filled 2016-11-09: qty 1
  Filled 2016-11-09: qty 4
  Filled 2016-11-09: qty 1
  Filled 2016-11-09: qty 8
  Filled 2016-11-09 (×2): qty 1
  Filled 2016-11-09: qty 4
  Filled 2016-11-09 (×6): qty 1
  Filled 2016-11-09: qty 4
  Filled 2016-11-09 (×2): qty 1
  Filled 2016-11-09 (×2): qty 4
  Filled 2016-11-09 (×2): qty 1
  Filled 2016-11-09: qty 4
  Filled 2016-11-09 (×3): qty 1

## 2016-11-09 MED ORDER — SODIUM CHLORIDE 0.9 % IV SOLN
INTRAVENOUS | Status: DC
  Administered 2016-11-09 (×3): via INTRAVENOUS
  Administered 2016-11-10: 150 mL/h via INTRAVENOUS
  Administered 2016-11-10 – 2016-11-11 (×4): via INTRAVENOUS
  Administered 2016-11-11: 150 mL/h via INTRAVENOUS
  Administered 2016-11-12 – 2016-11-15 (×6): via INTRAVENOUS

## 2016-11-09 MED ORDER — PIPERACILLIN-TAZOBACTAM IN DEX 2-0.25 GM/50ML IV SOLN
2.2500 g | Freq: Four times a day (QID) | INTRAVENOUS | Status: DC
  Administered 2016-11-09: 2.25 g via INTRAVENOUS
  Filled 2016-11-09 (×2): qty 50

## 2016-11-09 MED ORDER — ACETAMINOPHEN 650 MG RE SUPP: 650.0000 mg | Freq: Four times a day (QID) | RECTAL | Status: DC | PRN

## 2016-11-09 MED ORDER — HYDRALAZINE HCL 25 MG PO TABS
25.0000 mg | ORAL_TABLET | Freq: Four times a day (QID) | ORAL | Status: DC
  Administered 2016-11-09 – 2016-11-15 (×27): 25 mg via ORAL
  Filled 2016-11-09 (×27): qty 1

## 2016-11-09 MED ORDER — FUROSEMIDE 10 MG/ML IJ SOLN
20.0000 mg | Freq: Two times a day (BID) | INTRAMUSCULAR | Status: DC
  Administered 2016-11-09: 20 mg via INTRAVENOUS
  Filled 2016-11-09: qty 2

## 2016-11-09 MED ORDER — CALCITONIN (SALMON) 200 UNIT/ML IJ SOLN
4.0000 [IU]/kg | Freq: Two times a day (BID) | INTRAMUSCULAR | Status: DC
  Administered 2016-11-09: 160 [IU] via SUBCUTANEOUS
  Filled 2016-11-09 (×2): qty 0.8

## 2016-11-09 MED ORDER — CALCITONIN (SALMON) 200 UNIT/ML IJ SOLN
160.0000 [IU] | Freq: Two times a day (BID) | INTRAMUSCULAR | Status: AC
  Administered 2016-11-09 – 2016-11-10 (×3): 160 [IU] via SUBCUTANEOUS
  Filled 2016-11-09 (×3): qty 0.8

## 2016-11-09 MED ORDER — BOOST / RESOURCE BREEZE PO LIQD
1.0000 | Freq: Three times a day (TID) | ORAL | Status: DC
  Administered 2016-11-09: 1 via ORAL
  Administered 2016-11-09: 13:00:00 via ORAL
  Administered 2016-11-10 – 2016-11-21 (×26): 1 via ORAL

## 2016-11-09 MED ORDER — BISACODYL 10 MG RE SUPP: 10.0000 mg | Freq: Every day | RECTAL | Status: DC | PRN

## 2016-11-09 MED ORDER — ONDANSETRON HCL 4 MG PO TABS: 4.0000 mg | ORAL_TABLET | Freq: Four times a day (QID) | ORAL | Status: DC | PRN

## 2016-11-09 MED ORDER — ATORVASTATIN CALCIUM 20 MG PO TABS
20.0000 mg | ORAL_TABLET | Freq: Every day | ORAL | Status: DC
  Administered 2016-11-09 – 2016-11-20 (×11): 20 mg via ORAL
  Filled 2016-11-09 (×11): qty 1

## 2016-11-09 MED ORDER — SENNA 8.6 MG PO TABS
2.0000 | ORAL_TABLET | Freq: Two times a day (BID) | ORAL | Status: DC
  Administered 2016-11-09 – 2016-11-10 (×4): 17.2 mg via ORAL
  Filled 2016-11-09 (×4): qty 2

## 2016-11-09 MED ORDER — FAMOTIDINE 20 MG PO TABS
20.0000 mg | ORAL_TABLET | Freq: Two times a day (BID) | ORAL | Status: DC
  Administered 2016-11-09: 20 mg via ORAL
  Filled 2016-11-09: qty 1

## 2016-11-09 MED ORDER — HEPARIN SODIUM (PORCINE) 5000 UNIT/ML IJ SOLN
5000.0000 [IU] | Freq: Three times a day (TID) | INTRAMUSCULAR | Status: DC
  Administered 2016-11-09 – 2016-11-21 (×36): 5000 [IU] via SUBCUTANEOUS
  Filled 2016-11-09 (×33): qty 1

## 2016-11-09 MED ORDER — PANTOPRAZOLE SODIUM 40 MG PO TBEC
40.0000 mg | DELAYED_RELEASE_TABLET | Freq: Every day | ORAL | Status: DC
Start: 2016-11-09 — End: 2016-11-21
  Administered 2016-11-09 – 2016-11-21 (×13): 40 mg via ORAL
  Filled 2016-11-09 (×13): qty 1

## 2016-11-09 NOTE — ED Provider Notes (Signed)
CT without acute findings Lab team cancelled CMP as they did not believe results They request repeat labs BMP ordered Pt updated on plan Will admit for BMP results    Ripley Fraise, MD 11/09/16 [phone]

## 2016-11-09 NOTE — ED Notes (Signed)
Dr Christy Gentles and Roderic Palau RN notified calcium 13.3.

## 2016-11-09 NOTE — ED Notes (Signed)
Patient transported to CT 

## 2016-11-09 NOTE — ED Notes (Signed)
Below order not completed by EW. 

## 2016-11-09 NOTE — Progress Notes (Signed)
Pharmacy Antibiotic Note  [name] Michael is a 56 y.o. female c/o abdominal distention admitted on 11/08/2016 with UTI.  Pharmacy has been consulted for cefepime dosing.  Plan: Cefepime 500 mg IV q24h F/u cultures/scr  Height: 4\' 6"  (137.2 cm) Weight: 88 lb (39.9 kg) IBW/kg (Calculated) : 31.7  Temp (24hrs), Avg:97.8 F (36.6 C), Min:97.8 F (36.6 C), Max:97.8 F (36.6 C)   Recent Labs Lab 11/08/16 2107 11/08/16 2321 11/08/16 2343 11/09/16 0009 11/09/16 0132  WBC 10.7*  --   --   --   --   CREATININE  --  6.20* 9.50*  --  8.41*  LATICACIDVEN  --   --   --  1.44  --     Estimated Creatinine Clearance: 4.1 mL/min (A) (by C-G formula based on SCr of 8.41 mg/dL (H)).    Allergies  Allergen Reactions  . Hydrocodone Nausea And Vomiting    Antimicrobials this admission: 5/20 zosyn >> x1 ED 5/20 cefepime >>   Dose adjustments this admission:   Microbiology results:  BCx:   UCx:    Sputum:    MRSA PCR:   Thank you for allowing pharmacy to be a part of this patient's care.  Dorrene German 11/09/2016 4:31 AM

## 2016-11-09 NOTE — Progress Notes (Addendum)
PROGRESS NOTE   [name] Michael  CHY:[id number]    DOB: [birth year]    DOA: 11/08/2016  PCP: Arnoldo Morale, MD   I have briefly reviewed patients previous medical records in [facility].  Brief Narrative:  56 year old female with a PMH of DM 2, HTN, HLD, stroke, a resident of a retirement facility, stage 4 chronic kidney disease with baseline creatinine of 2.2 (Dr. Florene Glen, nephrology), hospitalized 5/2-5/44 sepsis secondary to CAP, ESBL Escherichia coli UTI, AKI and nephrolithiasis with right hydronephrosis, completed course of amoxicillin and azithromycin, presented with 2 days history of abdominal pain, distention, nausea, vomiting, loose stools, decreased urine output. Admitted for acute on stage IV chronic kidney disease, hypercalcemia, nausea and vomiting. Nephrology consulted.   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 Acute on stage IV chronic kidney disease: Nephrology consultation appreciated. Baseline creatinine 2.2. Suspect primarily due to hypercalcemia and volume depletion. Mildly uremic but try to correct without dialysis for now. Aggressive IV fluid hydration, strict intake output (has Foley catheter)-urine output improving. Follow BMP every 6 hours. Creatinine has slightly improved. Discussed with nephrology. 2. Hypercalcemia: Presumed from milk calculi due to Tums for heartburn. Follow PTH. Hold vitamin D and calcium supplements. High-dose IV normal saline and low-dose Lasix per nephrology. Also on calcitonin. Nephrology doubts myeloma, not anemic. Calcium has improved from 13.3 > 12.5. 3. Acute cystitis: IV cefepime pending urine culture results. 4. Acute toxic metabolic encephalopathy: Secondary to uremia from acute kidney injury and  electrolyte abnormalities, hypercalcemia & UTI. No focal deficits. Treat underlying cause and monitor closely. 5. Essential hypertension: Uncontrolled. Continue oral metoprolol and hydralazine. Consider when necessary IV hydralazine. 6. Hyperlipidemia: Statins. 7. Dehydration with hyponatremia: IV normal saline and follow BMP closely. Sodium 123. 8. Anemia: Follow CBCs. 9. Nausea, vomiting and questionable diarrhea:? Related to mild uremia from acute kidney injury versus acute GE. Low suspicion for C. difficile. 10. History of CVA: Continue aspirin, statins. 11. Chronic debility: Resides at retirement facility because she needs help with her medications. 12. Type II DM: Reasonable inpatient control. Continue SSI. Holding long-acting insulin for now. 13. GERD: On famotidine and PPI. 14. Resolved right hydronephrosis and distal right ureteric stone: By CT scan. 15. Constipation: Aggressive bowel regimen. May be complicated by hypercalcemia.? Overflow incontinence   DVT prophylaxis: Subcutaneous heparin Code Status: Full Family Communication: None at bedside Disposition: Admitted to stepdown. PT evaluation.? Return to retirement facility versus SNF, when stable.   Consultants:  Nephrology   Procedures:  Foley catheter  Antimicrobials:  None    Subjective: Seen this morning. Lethargic but arousable. Oriented only to self. Follow simple instructions. No further nausea, vomiting or diarrhea reported. Denies pain.   ROS: No chest pain or dyspnea.  Objective:  Vitals:   11/09/16 0300 11/09/16 0430 11/09/16 0605 11/09/16 0800  BP: (!) 194/65 (!) 173/140 (!) 178/107 (!) 191/96  Pulse: 77 76 82 81  Resp: (!) 22 18 16 13   Temp:   98.1 F (36.7 C) 98.3 F (36.8 C)  TempSrc:   Oral Oral  SpO2: 92% 100% 100% 95%  Weight:      Height:   4\' 6"  (1.372 m)     Examination:  General exam: Pleasant middle-aged female, small  built and thinly nourished, lying comfortably propped up in  bed. Oral mucosa dry. Respiratory system: Clear to auscultation. Respiratory effort normal. Cardiovascular system: S1 & S2 heard, RRR. No JVD, murmurs, rubs, gallops or clicks. No pedal edema. Telemetry: Sinus rhythm. Gastrointestinal system: Abdomen is nondistended, soft and nontender. No organomegaly or masses felt. Normal bowel sounds heard. Central nervous system: Alert and oriented. No focal neurological deficits. Extremities: Symmetric 5 x 5 power. Skin: No rashes, lesions or ulcers Psychiatry: Judgement and insight impaired. Mood & affect appropriate.     Data Reviewed: I have personally reviewed following labs and imaging studies  CBC:  Recent Labs Lab 11/08/16 2107 11/08/16 2321 11/08/16 2343 11/09/16 0719  WBC 10.7*  --   --  10.4  NEUTROABS 8.1*  --   --  8.4*  HGB 12.0 7.5* 12.9 11.5*  HCT 33.0* 22.0* 38.0 33.3*  MCV 83.5  --   --  84.7  PLT 335  --   --  825   Basic Metabolic Panel:  Recent Labs Lab 11/08/16 2321 11/08/16 2343 11/09/16 0132 11/09/16 0719  NA 128* 116* 121* 123*  K 3.7 4.7 4.5 4.8  CL 92* 71* 75* 82*  CO2  --   --  34* 27  GLUCOSE 99 140* 124* 127*  BUN 65* 77* 74* 66*  CREATININE 6.20* 9.50* 8.41* 7.71*  CALCIUM  --   --  13.3* 12.5*  PHOS  --   --   --  5.2*   Liver Function Tests:  Recent Labs Lab 11/09/16 0719  AST 18  ALT 15  ALKPHOS 62  BILITOT 0.7  PROT 7.1  ALBUMIN 3.1*   Coagulation Profile:  Recent Labs Lab 11/09/16 0719  INR 1.01   CBG:  Recent Labs Lab 11/08/16 2103 11/09/16 0912  GLUCAP 134* 137*    No results found for this or any previous visit (from the past 240 hour(s)).       Radiology Studies: Ct Abdomen Pelvis Wo Contrast  Result Date: 11/09/2016 CLINICAL DATA:  Vomiting and upper abdominal pain. EXAM: CT ABDOMEN AND PELVIS WITHOUT CONTRAST TECHNIQUE: Multidetector CT imaging of the abdomen and pelvis was performed following the standard protocol without IV contrast. COMPARISON:  CT  10/22/2016, additional priors FINDINGS: Lower chest: Motion artifact.  No pleural fluid or consolidation. Hepatobiliary: No focal hepatic lesion. Gallbladder is decompressed, no calcified stone. No biliary dilatation. Pancreas: A few chronic pancreatic head calcifications. No ductal dilatation or inflammation. Spleen: Normal in size without focal abnormality. Adrenals/Urinary Tract: Enlarged edematous appearing kidneys with perinephric edema, chronic finding unchanged from December 2017. Previous right hydronephrosis has resolved. Previous stone in the distal right ureter is no longer seen. No left hydronephrosis. Bilateral nonobstructing intrarenal calculi are again seen. Urinary bladder is physiologically distended without wall thickening. No adrenal nodule. Stomach/Bowel: Stomach distended with ingested contrast, no gastric wall thickening. No bowel obstruction or inflammation. Moderate diffuse stool burden throughout the colon. Air and enteric contrast in the proximal appendix, no evidence of appendicitis. Vascular/Lymphatic: Aortic and branch atherosclerosis. No adenopathy. Reproductive: Uterus and bilateral adnexa are unremarkable. Other: No free air or free fluid.  No intra-abdominal abscess. Musculoskeletal: There are no acute or suspicious osseous abnormalities. IMPRESSION: 1. Resolved right hydronephrosis and distal right ureteric stone. 2. Enlarged edematous kidneys with perinephric edema, a chronic finding and unchanged over the past 6 months. Bilateral nonobstructing renal calculi. 3. No new abnormality. 4. Moderate stool burden suggesting constipation. Aortic atherosclerosis without aneurysm. Electronically Signed  By: Jeb Levering M.D.   On: 11/09/2016 01:19   Dg Abd Acute W/chest  Result Date: 11/08/2016 CLINICAL DATA:  Abdominal pain and vomiting.  Somnolence. EXAM: DG ABDOMEN ACUTE W/ 1V CHEST COMPARISON:  10/22/2016 CT abdomen and pelvis FINDINGS: No bowel obstruction. Increased colonic  stool burden noted from cecum through rectum. Faint linear calcification may correspond with the known right UVJ stone versus stool particle noted in the right hemipelvis.Heart size and mediastinal contours are within normal limits. Minimal atelectasis at the lung bases. Faint opacity at the left costophrenic angle on the frontal view of the chest does not persist on additional imaging and may be due to overlap of the ribs and breast tissue. IMPRESSION: No acute cardiopulmonary disease. Increased colonic stool burden consistent with constipation. There is a linear tiny calcification in the right hemipelvis which could potentially represent the known right UVJ stone, vascular calcification or tiny focus of stool within overlying bowel. Electronically Signed   By: Ashley Royalty M.D.   On: 11/08/2016 22:21        Scheduled Meds: . aspirin  81 mg Oral Daily  . atorvastatin  20 mg Oral q1800  . calcitonin  4 Units/kg Subcutaneous BID  . famotidine  20 mg Oral BID  . feeding supplement  1 Container Oral TID BM  . furosemide  20 mg Intravenous Q12H  . heparin  5,000 Units Subcutaneous Q8H  . hydrALAZINE  25 mg Oral Q6H  . insulin aspart  0-9 Units Subcutaneous TID WC  . iopamidol      . metoprolol tartrate  100 mg Oral BID  . pantoprazole  40 mg Oral Daily  . senna  2 tablet Oral BID  . sodium chloride flush  3 mL Intravenous Q12H  . triamcinolone cream  1 application Topical TID   Continuous Infusions: . sodium chloride 175 mL/hr at 11/09/16 0834  . ceFEPime (MAXIPIME) IV       LOS: 0 days     Lillis Nuttle, MD, FACP, FHM. [facility] Pager [phone]  If 7PM-7AM, please contact night-coverage [email or web address] Password [email or web address] 11/09/2016, 10:58 AM

## 2016-11-09 NOTE — H&P (Addendum)
History and Physical    [name] Michael EML:[id number] DOB: Jun 24, 1960 DOA: 11/08/2016  PCP: Arnoldo Morale, MD   Patient coming from: Retirement facility  Chief Complaint: Diarrhea, abdominal pain, leg cramps  HPI: [name] Michael is a 56 y.o. woman with a history of CVA, HTN, Type 2 diabetes, and CKD 4 (baseline creatinine around 2.2) who was admitted from 5/2 - 5/4 for management of sepsis secondary to CAP, AKI, and nephrolithiasis with right hydronephrosis.  She was discharged on amoxicillin and azithromycin, which she completed.  Since then, she reports two days of abdominal pain and distention with increased nausea and vomiting.  She reports loose stools, normal in color.  Denies watery diarrhea or BRBPR.  When asked to quantify how much vomiting and diarrhea she has had, she says "all day and all night" for two days.  PO intake decreased.  She has noticed changes in the amount of her urine output, but she has had dysuria.  No gross hematuria.  No fever.  Of note, she answers all orientation questions appropriately but she demonstrates intermittent confusion with inappropriate responses during the interview.  When confronted about her slow response times, she says that she is just sleepy.  Also, she complains of "whole body cramps", worse in both legs, which is new.  ED Course: I-stat labs were erratic and unreliable.  First BMP from the lab shows sodium 121, bicarb 24, BUN 74, creatinine 8.4.  Calcium level is 13.  WBC count 10.7.  Hgb 12 (was 10 at last discharge).  Troponin negative.  U/A shows large leukocytes, TNTC RBC, TNTC WBC, rare bacteria.  Chest xray negative for acute process.  KUB shows constipation.  CT of the abdomen and pelvis without contrast shows resolution of right hydronephrosis and distal right ureter stone, chronic edematous kidneys, bilateral nonobstructing renal calculi, and moderate stool burden suggesting constipation.  She was on a maintenance rate of NS at 75cc/hr  until her BMP from the lab came back.  1L NS bolus ordered after that.  She has received IV zosyn for recurrent UTI.  Hospitalist asked to admit.  Review of Systems: Limited due to mental status changes.   Past Medical History:  Diagnosis Date  . Diabetes mellitus   . Hyperlipidemia   . Hypertension   . Stroke [facility]) 04-01-11   left frontal subcortical, saw Dr. [name]   . TIA (transient ischemic attack) 03-12-11    Past Surgical History:  Procedure Laterality Date  . OPEN REDUCTION INTERNAL FIXATION (ORIF) DISTAL RADIAL FRACTURE Left 01/28/2016   Procedure: OPEN REDUCTION INTERNAL FIXATION (ORIF) DISTAL RADIAL FRACTURE;  Surgeon: Iran Planas, MD;  Location: [place];  Service: Orthopedics;  Laterality: Left;  . [place]     age 43     reports that she has been smoking Cigarettes.  She has a 32.00 pack-year smoking history. She has never used smokeless tobacco. She reports that she does not drink alcohol or use drugs.  She smokes about six cigarettes per day now.  She is not married.  She does not have adult children.  Allergies  Allergen Reactions  . Hydrocodone Nausea And Vomiting    Family History  Problem Relation Age of Onset  . Stroke Mother   . Diabetes Mother   . Kidney failure Mother   . Heart failure Mother   . Stroke Father   . Cancer Sister  Breast- 50's     Prior to Admission medications   Medication Sig Start Date End Date Taking? Authorizing Provider  aspirin 81 MG tablet Take 1 tablet (81 mg total) by mouth daily. 08/24/12  Yes Reyne Dumas, MD  atorvastatin (LIPITOR) 20 MG tablet Take 1 tablet (20 mg total) by mouth daily. 04/29/16  Yes Arnoldo Morale, MD  calcium-vitamin D (OSCAL WITH D) 500-200 MG-UNIT tablet Take 1 tablet by mouth 2 (two) times daily.   Yes [provider]  insulin aspart protamine- aspart (NOVOLOG MIX 70/30) (70-30) 100 UNIT/ML injection Inject 0.2 mLs (20  Units total) into the skin 2 (two) times daily with a meal. 06/30/16  Yes Patrecia Pour, MD  linagliptin (TRADJENTA) 5 MG TABS tablet Take 5 mg by mouth daily.   Yes [provider]  metoprolol (LOPRESSOR) 100 MG tablet Take 100 mg by mouth 2 (two) times daily.   Yes [provider]  Multiple Vitamins-Minerals (CERTAVITE SENIOR/ANTIOXIDANT) TABS Take 1 tablet by mouth daily.   Yes [provider]  nystatin (NYSTATIN) powder Apply 1 g topically 2 (two) times daily.   Yes [provider]  omeprazole (PRILOSEC) 40 MG capsule Take 40 mg by mouth daily.   Yes [provider]  ondansetron (ZOFRAN ODT) 4 MG disintegrating tablet Take 1 tablet (4 mg total) by mouth every 8 (eight) hours as needed for nausea or vomiting. 10/14/16  Yes Street, [place], PA-C  ranitidine (ZANTAC) 150 MG tablet Take 1 tablet (150 mg total) by mouth 2 (two) times daily. 10/14/16  Yes Street, [place], PA-C  Saccharomyces boulardii (PROBIOTIC) 250 MG CAPS Take 1 capsule by mouth 2 (two) times daily.   Yes [provider]  sodium bicarbonate 650 MG tablet Take 1 tablet (650 mg total) by mouth 2 (two) times daily. 06/30/16  Yes Patrecia Pour, MD  triamcinolone cream (KENALOG) 0.1 % Apply 1 application topically 3 (three) times daily. Apply to labia three time a day   Yes [provider]    Physical Exam: Vitals:   11/08/16 2002 11/08/16 2239 11/09/16 0146 11/09/16 0300  BP: 124/80 (!) 164/89 (!) 167/85 (!) 194/65  Pulse: 77 76 71 77  Resp: 18 13 16  (!) 22  Temp:      TempSrc:      SpO2: 95% 100% 98% 92%  Weight:      Height:          Constitutional: NAD, calm, comfortable Vitals:   11/08/16 2002 11/08/16 2239 11/09/16 0146 11/09/16 0300  BP: 124/80 (!) 164/89 (!) 167/85 (!) 194/65  Pulse: 77 76 71 77  Resp: 18 13 16  (!) 22  Temp:      TempSrc:      SpO2: 95% 100% 98% 92%  Weight:      Height:       Eyes: PERRL, lids and conjunctivae normal ENMT: Mucous  membranes are moist. Posterior pharynx not visualized. Normal dentition.  Neck: normal appearance, supple, no masses Respiratory: clear to auscultation bilaterally, no wheezing, no crackles. Normal respiratory effort. No accessory muscle use.  Cardiovascular: Normal rate, regular rhythm, no murmurs / rubs / gallops. No extremity edema. 2+ pedal pulses.  GI: abdomen is soft and compressible.  Mild distention but abdomen is not firm/rigid.  No tenderness.  No guarding.  Bowel sounds are present. Musculoskeletal:  No joint deformity in upper and lower extremities. Good ROM, no contractures. Normal muscle tone.  Skin: no rashes, pale, cool, dry Neurologic: CN 2-12  grossly intact. Sensation intact, generalized weakness. Psychiatric: Alert and oriented x 3.  Can name the president.  Flat affect.  Insight limited.    Labs on Admission: I have personally reviewed following labs and imaging studies  CBC:  Recent Labs Lab 11/08/16 2107 11/08/16 2321 11/08/16 2343  WBC 10.7*  --   --   NEUTROABS 8.1*  --   --   HGB 12.0 7.5* 12.9  HCT 33.0* 22.0* 38.0  MCV 83.5  --   --   PLT 335  --   --    Basic Metabolic Panel:  Recent Labs Lab 11/08/16 2321 11/08/16 2343 11/09/16 0132  NA 128* 116* 121*  K 3.7 4.7 4.5  CL 92* 71* 75*  CO2  --   --  34*  GLUCOSE 99 140* 124*  BUN 65* 77* 74*  CREATININE 6.20* 9.50* 8.41*  CALCIUM  --   --  13.3*   GFR: Estimated Creatinine Clearance: 4.1 mL/min (A) (by C-G formula based on SCr of 8.41 mg/dL (H)).  CBG:  Recent Labs Lab 11/08/16 2103  GLUCAP 134*   Urine analysis:    Component Value Date/Time   COLORURINE STRAW (A) 11/09/2016 0110   APPEARANCEUR HAZY (A) 11/09/2016 0110   LABSPEC 1.005 11/09/2016 0110   PHURINE 9.0 (H) 11/09/2016 0110   GLUCOSEU 50 (A) 11/09/2016 0110   HGBUR SMALL (A) 11/09/2016 0110   BILIRUBINUR NEGATIVE 11/09/2016 0110   BILIRUBINUR n 05/25/2012 1202   KETONESUR NEGATIVE 11/09/2016 0110   PROTEINUR  NEGATIVE 11/09/2016 0110   UROBILINOGEN 0.2 05/25/2012 1202   UROBILINOGEN 0.2 03/29/2011 0131   NITRITE NEGATIVE 11/09/2016 0110   LEUKOCYTESUR LARGE (A) 11/09/2016 0110    Radiological Exams on Admission: Ct Abdomen Pelvis Wo Contrast  Result Date: 11/09/2016 CLINICAL DATA:  Vomiting and upper abdominal pain. EXAM: CT ABDOMEN AND PELVIS WITHOUT CONTRAST TECHNIQUE: Multidetector CT imaging of the abdomen and pelvis was performed following the standard protocol without IV contrast. COMPARISON:  CT 10/22/2016, additional priors FINDINGS: Lower chest: Motion artifact.  No pleural fluid or consolidation. Hepatobiliary: No focal hepatic lesion. Gallbladder is decompressed, no calcified stone. No biliary dilatation. Pancreas: A few chronic pancreatic head calcifications. No ductal dilatation or inflammation. Spleen: Normal in size without focal abnormality. Adrenals/Urinary Tract: Enlarged edematous appearing kidneys with perinephric edema, chronic finding unchanged from December 2017. Previous right hydronephrosis has resolved. Previous stone in the distal right ureter is no longer seen. No left hydronephrosis. Bilateral nonobstructing intrarenal calculi are again seen. Urinary bladder is physiologically distended without wall thickening. No adrenal nodule. Stomach/Bowel: Stomach distended with ingested contrast, no gastric wall thickening. No bowel obstruction or inflammation. Moderate diffuse stool burden throughout the colon. Air and enteric contrast in the proximal appendix, no evidence of appendicitis. Vascular/Lymphatic: Aortic and branch atherosclerosis. No adenopathy. Reproductive: Uterus and bilateral adnexa are unremarkable. Other: No free air or free fluid.  No intra-abdominal abscess. Musculoskeletal: There are no acute or suspicious osseous abnormalities. IMPRESSION: 1. Resolved right hydronephrosis and distal right ureteric stone. 2. Enlarged edematous kidneys with perinephric edema, a chronic  finding and unchanged over the past 6 months. Bilateral nonobstructing renal calculi. 3. No new abnormality. 4. Moderate stool burden suggesting constipation. Aortic atherosclerosis without aneurysm. Electronically Signed   By: Jeb Levering M.D.   On: 11/09/2016 01:19   Dg Abd Acute W/chest  Result Date: 11/08/2016 CLINICAL DATA:  Abdominal pain and vomiting.  Somnolence. EXAM: DG ABDOMEN ACUTE W/ 1V CHEST COMPARISON:  10/22/2016 CT abdomen  and pelvis FINDINGS: No bowel obstruction. Increased colonic stool burden noted from cecum through rectum. Faint linear calcification may correspond with the known right UVJ stone versus stool particle noted in the right hemipelvis.Heart size and mediastinal contours are within normal limits. Minimal atelectasis at the lung bases. Faint opacity at the left costophrenic angle on the frontal view of the chest does not persist on additional imaging and may be due to overlap of the ribs and breast tissue. IMPRESSION: No acute cardiopulmonary disease. Increased colonic stool burden consistent with constipation. There is a linear tiny calcification in the right hemipelvis which could potentially represent the known right UVJ stone, vascular calcification or tiny focus of stool within overlying bowel. Electronically Signed   By: Ashley Royalty M.D.   On: 11/08/2016 22:21    EKG: Independently reviewed. NSR.  No acute ST changes.    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  Patient demonstrating oliguria in the ED.  She has only made about 300cc of urine in the past 8 hours. --Agree with aggressive IV fluids for now --Strict I/O --BMP q4h x 3 --Check PTH, phos,  urine sodium and creatinine, urine osm, serum osm --Will call nephrology to discuss this case --Hold MVI, calcium, sodium bicarb, and probiotic for now --Bisphosphonate contraindicated in renal failure; will start calcitonin now  Acute encephalopathy.  I believe she has a component of uremia, but electrolyte abnormalities, UTI are likely contributing to her mental status as well. --Neurochecks q2h x 3 then q4h --BMP q4h x 3 to follow rate of sodium correction --Check LFTs, TSH  UTI --IV cefepime --Urine and blood cultures pending  Constipation --She will need an bowel regimen.  Start senna two tabs BID for now.  Accelerated Hypertension --Lopressor --Add hydralazine  Diabetes --Hold long acting insulin for now.  Sensitive sliding scale with meals --clear liquid diet as tolerated for now  Prior CVA --Baby aspirin, statin     DVT prophylaxis: SubQ heparin Code Status: FULL Family Communication: Patient alone in the ED at time of admission. Disposition Plan: To be determined. Consults called: None.  Will need to discuss with nephrology in the AM. Admission status: Inpatient, stepdown unit.  I expect this patient will need inpatient services for greater than two midnights.   TIME SPENT: 60 minutes   Eber Jones MD [facility] Pager [phone]  If 7PM-7AM, please contact night-coverage [email or web address] Password [email or web address]  11/09/2016, 4:00 AM

## 2016-11-09 NOTE — Consult Note (Signed)
Renal Service Consult Note [name] Michael 11/09/2016 [name] Michael Requesting Physician:  Dr Algis Liming  Reason for Consult:  Acute on CRF HPI: The patient is a 56 y.o. year-old with history of HTN, DM2 , HL, CVA. Lives in retirement facility to get help w taking medications (per pt).  Has CKD baseline creat 2.2, f/b Dr Florene Glen at [facility] per pt.  Presenting with 2 day hx of abd pain and distension and nausea/ vomiting. Questionable diarrhea. Recent admit for ESBL EColi UTI with sepsis. In ED creat was high 6-9 range, CA high 13 , serum HCO3 high at 35.  Asked to see for renal failure and ^^Ca.    Pateint reports N/V main issues, mild abd pain.  Has been taking Tums for heartburn, won't say how much. Vague historian.  Uses cane to walk with . USed to live w her brother.  No husband, no kids per pt. Doesn't work.    ROS  denies CP  no joint pain   no HA  no blurry vision  no rash    Past Medical History  Past Medical History:  Diagnosis Date  . Diabetes mellitus   . Hyperlipidemia   . Hypertension   . Stroke [facility]) 04-01-11   left frontal subcortical, saw Dr. [name]   . TIA (transient ischemic attack) 03-12-11   Past Surgical History  Past Surgical History:  Procedure Laterality Date  . OPEN REDUCTION INTERNAL FIXATION (ORIF) DISTAL RADIAL FRACTURE Left 01/28/2016   Procedure: OPEN REDUCTION INTERNAL FIXATION (ORIF) DISTAL RADIAL FRACTURE;  Surgeon: Iran Planas, MD;  Location: [place];  Service: Orthopedics;  Laterality: Left;  . [place] ADENOIDECTOMY     age 29   Family History  Family History  Problem Relation Age of Onset  . Stroke Mother   . Diabetes Mother   . Kidney failure Mother   . Heart failure Mother   . Stroke Father   . Cancer Sister        Breast- 28's   Social History  reports that she has been smoking Cigarettes.  She has a 32.00 pack-year smoking history. She has never used  smokeless tobacco. She reports that she does not drink alcohol or use drugs. Allergies  Allergies  Allergen Reactions  . Hydrocodone Nausea And Vomiting   Home medications Prior to Admission medications   Medication Sig Start Date End Date Taking? Authorizing Provider  aspirin 81 MG tablet Take 1 tablet (81 mg total) by mouth daily. 08/24/12  Yes Reyne Dumas, MD  atorvastatin (LIPITOR) 20 MG tablet Take 1 tablet (20 mg total) by mouth daily. 04/29/16  Yes Arnoldo Morale, MD  calcium-vitamin D (OSCAL WITH D) 500-200 MG-UNIT tablet Take 1 tablet by mouth 2 (two) times daily.   Yes [provider]  insulin aspart protamine- aspart (NOVOLOG MIX 70/30) (70-30) 100 UNIT/ML injection Inject 0.2 mLs (20 Units total) into the skin 2 (two) times daily with a meal. 06/30/16  Yes Patrecia Pour, MD  linagliptin (TRADJENTA) 5 MG TABS tablet Take 5 mg by mouth daily.   Yes [provider]  metoprolol (LOPRESSOR) 100 MG tablet Take 100 mg by mouth 2 (two) times daily.   Yes [provider]  Multiple Vitamins-Minerals (CERTAVITE SENIOR/ANTIOXIDANT) TABS Take 1 tablet by mouth daily.   Yes [provider]  nystatin (NYSTATIN) powder Apply 1 g topically 2 (two) times daily.   Yes  [provider]  omeprazole (PRILOSEC) 40 MG capsule Take 40 mg by mouth daily.   Yes [provider]  ondansetron (ZOFRAN ODT) 4 MG disintegrating tablet Take 1 tablet (4 mg total) by mouth every 8 (eight) hours as needed for nausea or vomiting. 10/14/16  Yes Street, [place], PA-C  ranitidine (ZANTAC) 150 MG tablet Take 1 tablet (150 mg total) by mouth 2 (two) times daily. 10/14/16  Yes Street, [place], PA-C  Saccharomyces boulardii (PROBIOTIC) 250 MG CAPS Take 1 capsule by mouth 2 (two) times daily.   Yes [provider]  sodium bicarbonate 650 MG tablet Take 1 tablet (650 mg total) by mouth 2 (two) times daily. 06/30/16  Yes Patrecia Pour, MD  triamcinolone cream (KENALOG) 0.1 %  Apply 1 application topically 3 (three) times daily. Apply to labia three time a day   Yes [provider]   Liver Function Tests No results for input(s): AST, ALT, ALKPHOS, BILITOT, PROT, ALBUMIN in the last 168 hours. No results for input(s): LIPASE, AMYLASE in the last 168 hours. CBC  Recent Labs Lab 11/08/16 2107 11/08/16 2321 11/08/16 2343 11/09/16 0719  WBC 10.7*  --   --  10.4  NEUTROABS 8.1*  --   --  8.4*  HGB 12.0 7.5* 12.9 11.5*  HCT 33.0* 22.0* 38.0 33.3*  MCV 83.5  --   --  84.7  PLT 335  --   --  720   Basic Metabolic Panel  Recent Labs Lab 11/08/16 2321 11/08/16 2343 11/09/16 0132  NA 128* 116* 121*  K 3.7 4.7 4.5  CL 92* 71* 75*  CO2  --   --  34*  GLUCOSE 99 140* 124*  BUN 65* 77* 74*  CREATININE 6.20* 9.50* 8.41*  CALCIUM  --   --  13.3*   Iron/TIBC/Ferritin/ %Sat    Component Value Date/Time   IRON 24 (L) 06/29/2016 1404   TIBC 255 06/29/2016 1404   FERRITIN 1,102 (H) 06/29/2016 1404   IRONPCTSAT 9 (L) 06/29/2016 1404    Vitals:   11/09/16 0146 11/09/16 0300 11/09/16 0430 11/09/16 0605  BP: (!) 167/85 (!) 194/65 (!) 173/140 (!) 178/107  Pulse: 71 77 76 82  Resp: 16 (!) 22 18 16   Temp:    98.1 F (36.7 C)  TempSrc:    Oral  SpO2: 98% 92% 100% 100%  Weight:      Height:    4\' 6"  (1.372 m)   Exam Gen thin WF no distress, lethargic, mild myoclonus UE's No rash, cyanosis or gangrene Sclera anicteric, throat clear  No jvd or bruits Chest clear bilat RRR no MRG Abd soft ntnd no mass or ascites +bs GU defer MS no joint effusions or deformity, musc wasting LE's Ext no LE or UE edema / no wounds or ulcers Neuro is alert, Ox 3, nonfocal, gen'd weakness    Assessment: 1  Acute on CKD 4 - due to hyperCa++ primarily, maybe vol depleted as well. Mildly uremic, will try to correct w/o dialysis for now.  2  CKD 4 - baseline creat 2.2, f/b Dr Florene Glen [facility] 3  Hypercalcemia - presume milk alkali from tums for heartburn, check pth and  hold vit D/ Ca products. Give high flow 0.9% saline w low dose lasix for correction.  Also calcitonin ordered per primary. Not anemic, doubt myeloma 4  Hx CVA  5  Chron debility - is in retirement facility because she needs "help with her medications".   6  HTN 7  HL    Plan - as above  Kelly Splinter MD [employer] pager [phone]   11/09/2016, 8:31 AM

## 2016-11-09 NOTE — ED Provider Notes (Signed)
Final labs resulted She has significant metabolic abnormalities Will need admit  UTI noted Zosyn ordered due to concern for healthcare associated UTI D/w dr Lexine Baton carter for admission    Ripley Fraise, MD 11/09/16 0223

## 2016-11-10 DIAGNOSIS — E871 Hypo-osmolality and hyponatremia: Secondary | ICD-10-CM

## 2016-11-10 DIAGNOSIS — G9341 Metabolic encephalopathy: Secondary | ICD-10-CM

## 2016-11-10 DIAGNOSIS — I10 Essential (primary) hypertension: Secondary | ICD-10-CM

## 2016-11-10 DIAGNOSIS — R8271 Bacteriuria: Secondary | ICD-10-CM

## 2016-11-10 DIAGNOSIS — N179 Acute kidney failure, unspecified: Principal | ICD-10-CM

## 2016-11-10 LAB — RENAL FUNCTION PANEL
ALBUMIN: 2.8 g/dL — AB (ref 3.5–5.0)
ALBUMIN: 3.2 g/dL — AB (ref 3.5–5.0)
ANION GAP: 12 (ref 5–15)
Albumin: 3 g/dL — ABNORMAL LOW (ref 3.5–5.0)
Anion gap: 16 — ABNORMAL HIGH (ref 5–15)
Anion gap: 13 (ref 5–15)
BUN: 62 mg/dL — ABNORMAL HIGH (ref 6–20)
BUN: 62 mg/dL — ABNORMAL HIGH (ref 6–20)
BUN: 64 mg/dL — AB (ref 6–20)
CALCIUM: 10.4 mg/dL — AB (ref 8.9–10.3)
CALCIUM: 10.8 mg/dL — AB (ref 8.9–10.3)
CALCIUM: 11 mg/dL — AB (ref 8.9–10.3)
CO2: 19 mmol/L — AB (ref 22–32)
CO2: 19 mmol/L — ABNORMAL LOW (ref 22–32)
CO2: 22 mmol/L (ref 22–32)
CREATININE: 7.51 mg/dL — AB (ref 0.44–1.00)
CREATININE: 7.39 mg/dL — AB (ref 0.44–1.00)
Chloride: 101 mmol/L (ref 101–111)
Chloride: 95 mmol/L — ABNORMAL LOW (ref 101–111)
Chloride: 96 mmol/L — ABNORMAL LOW (ref 101–111)
Creatinine, Ser: 7.8 mg/dL — ABNORMAL HIGH (ref 0.44–1.00)
GFR calc Af Amer: 6 mL/min — ABNORMAL LOW (ref >60)
GFR calc Af Amer: 6 mL/min — ABNORMAL LOW (ref >60)
GFR calc non Af Amer: 5 mL/min — ABNORMAL LOW (ref >60)
GFR calc non Af Amer: 6 mL/min — ABNORMAL LOW (ref >60)
GFR, EST AFRICAN AMERICAN: 6 mL/min — AB (ref >60)
GFR, EST NON AFRICAN AMERICAN: 5 mL/min — AB (ref >60)
GLUCOSE: 215 mg/dL — AB (ref 65–99)
Glucose, Bld: 193 mg/dL — ABNORMAL HIGH (ref 65–99)
Glucose, Bld: 206 mg/dL — ABNORMAL HIGH (ref 65–99)
PHOSPHORUS: 6.2 mg/dL — AB (ref 2.5–4.6)
Phosphorus: 6 mg/dL — ABNORMAL HIGH (ref 2.5–4.6)
Phosphorus: 5.8 mg/dL — ABNORMAL HIGH (ref 2.5–4.6)
Potassium: 3.9 mmol/L (ref 3.5–5.1)
Potassium: 4.2 mmol/L (ref 3.5–5.1)
Potassium: 4.7 mmol/L (ref 3.5–5.1)
SODIUM: 129 mmol/L — AB (ref 135–145)
SODIUM: 131 mmol/L — AB (ref 135–145)
SODIUM: 133 mmol/L — AB (ref 135–145)

## 2016-11-10 LAB — GLUCOSE, CAPILLARY
Glucose-Capillary: 184 mg/dL — ABNORMAL HIGH (ref 65–99)
Glucose-Capillary: 265 mg/dL — ABNORMAL HIGH (ref 65–99)
Glucose-Capillary: 150 mg/dL — ABNORMAL HIGH (ref 65–99)

## 2016-11-10 LAB — URINE CULTURE

## 2016-11-10 LAB — CBC
HEMATOCRIT: 30.5 % — AB (ref 36.0–46.0)
HEMOGLOBIN: 10.3 g/dL — AB (ref 12.0–15.0)
MCH: 29 pg (ref 26.0–34.0)
MCHC: 33.8 g/dL (ref 30.0–36.0)
MCV: 85.9 fL (ref 78.0–100.0)
Platelets: 329 10*3/uL (ref 150–400)
RBC: 3.55 MIL/uL — ABNORMAL LOW (ref 3.87–5.11)
RDW: 12.5 % (ref 11.5–15.5)
WBC: 9.4 10*3/uL (ref 4.0–10.5)

## 2016-11-10 LAB — PTH, INTACT AND CALCIUM
Calcium, Total (PTH): 13.2 mg/dL (ref 8.7–10.2)
PTH: 12 pg/mL — AB (ref 15–65)

## 2016-11-10 LAB — C DIFFICILE QUICK SCREEN W PCR REFLEX
C Diff antigen: NEGATIVE
C Diff interpretation: NOT DETECTED
C Diff toxin: NEGATIVE

## 2016-11-10 LAB — CREATININE, URINE, RANDOM
Creatinine, Urine: 13.74 mg/dL
Creatinine, Urine: 10.95 mg/dL

## 2016-11-10 LAB — OSMOLALITY, URINE
Osmolality, Ur: 216 mOsm/kg — ABNORMAL LOW (ref 300–900)
Osmolality, Ur: 247 mOsm/kg — ABNORMAL LOW (ref 300–900)

## 2016-11-10 LAB — SODIUM, URINE, RANDOM
Sodium, Ur: 69 mmol/L
Sodium, Ur: 97 mmol/L

## 2016-11-10 MED ORDER — HYDRALAZINE HCL 20 MG/ML IJ SOLN
10.0000 mg | Freq: Once | INTRAMUSCULAR | Status: AC
  Administered 2016-11-10: 10 mg via INTRAVENOUS
  Filled 2016-11-10: qty 1

## 2016-11-10 MED ORDER — SACCHAROMYCES BOULARDII 250 MG PO CAPS
250.0000 mg | ORAL_CAPSULE | Freq: Two times a day (BID) | ORAL | Status: DC
  Administered 2016-11-10 – 2016-11-21 (×22): 250 mg via ORAL
  Filled 2016-11-10 (×22): qty 1

## 2016-11-10 MED ORDER — PROBIOTIC 250 MG PO CAPS: 1.0000 | ORAL_CAPSULE | Freq: Two times a day (BID) | ORAL | Status: DC

## 2016-11-10 MED ORDER — HYDRALAZINE HCL 20 MG/ML IJ SOLN
10.0000 mg | Freq: Three times a day (TID) | INTRAMUSCULAR | Status: DC | PRN
  Administered 2016-11-11 – 2016-11-12 (×2): 10 mg via INTRAVENOUS
  Filled 2016-11-10 (×2): qty 1

## 2016-11-10 NOTE — Evaluation (Signed)
Physical Therapy Evaluation Patient Details Name: [name] Michael MRN: [id number] DOB: 1961-06-06 Today's Date: 11/10/2016   History of Present Illness  [name] Michael is a 55 y.o. woman with a history of CVA, HTN, Type 2 diabetes, and CKD 4 (baseline creatinine around 2.2) who was admitted from 5/2 - 5/4 for management of sepsis secondary to CAP, AKI, and nephrolithiasis with right hydronephrosis.  Patient admitted with N&V and was found to be hypovolemic hyponatremia and hypercalcemia.  Clinical Impression  Patient presents with decreased independence with mobility due to deficits listed in PT problem list.  She will benefit from skilled PT in the acute setting to allow return to ALF following SNF level rehab stay.  Previously patient independent with mobility, but currently mod A for OOB to chair.      Follow Up Recommendations SNF    Equipment Recommendations  Other (comment) (TBA)    Recommendations for Other Services       Precautions / Restrictions Precautions Precautions: Fall      Mobility  Bed Mobility Overal bed mobility: Needs Assistance Bed Mobility: Supine to Sit     Supine to sit: Min assist     General bed mobility comments: assist to lift trunk  Transfers Overall transfer level: Needs assistance Equipment used: None Transfers: Sit to/from [employer] Transfers Sit to Stand: Mod assist Stand pivot transfers: Mod assist       General transfer comment: assist for lifting from EOB, and for balance, stand step with support to recliner, pt initially with posterior bias  Ambulation/Gait                Stairs            Wheelchair Mobility    Modified Rankin (Stroke Patients Only)       Balance Overall balance assessment: Needs assistance Sitting-balance support: Feet unsupported Sitting balance-Leahy Scale: Fair Sitting balance - Comments: sat EOB to don socks with LOB to R due to unlevel surface of bed, min A recovery and donned  other sock without LOB   Standing balance support: Single extremity supported Standing balance-Leahy Scale: Poor Standing balance comment: assist for balance due to posterior bias initially and unstable on feet with stand step to chair                             Pertinent Vitals/Pain Pain Assessment: Faces Faces Pain Scale: Hurts little more Pain Location: legs Pain Descriptors / Indicators: Aching Pain Intervention(s): Monitored during session;Repositioned    Home Living Family/patient expects to be discharged to:: Skilled nursing facility Living Arrangements: Alone   Type of Home: Assisted living       Home Layout: One level Home Equipment: None      Prior Function Level of Independence: Independent               Hand Dominance        Extremity/Trunk Assessment        Lower Extremity Assessment Lower Extremity Assessment: Generalized weakness       Communication   Communication: No difficulties  Cognition Arousal/Alertness: Awake/alert Behavior During Therapy: Flat affect Overall Cognitive Status: Impaired/Different from baseline Area of Impairment: Problem solving                             Problem Solving: Slow processing;Requires verbal cues General Comments: oriented to situation, etc, but slower  to respond to questions, etc, just woke up upon my entry      General Comments      Exercises     Assessment/Plan    PT Assessment Patient needs continued PT services  PT Problem List Decreased strength;Decreased balance;Decreased activity tolerance;Decreased mobility;Decreased cognition       PT Treatment Interventions DME instruction;Gait training;Patient/family education;Therapeutic exercise;Therapeutic activities;Functional mobility training;Balance training    PT Goals (Current goals can be found in the Care Plan section)  Acute Rehab PT Goals Patient Stated Goal: To get stronger PT Goal Formulation: With  patient Time For Goal Achievement: 11/24/16 Potential to Achieve Goals: Good    Frequency Min 3X/week   Barriers to discharge        Co-evaluation               AM-PAC PT "6 Clicks" Daily Activity  Outcome Measure Difficulty turning over in bed (including adjusting bedclothes, sheets and blankets)?: A Little Difficulty moving from lying on back to sitting on the side of the bed? : Total Difficulty sitting down on and standing up from a chair with arms (e.g., wheelchair, bedside commode, etc,.)?: Total Help needed moving to and from a bed to chair (including a wheelchair)?: A Lot Help needed walking in hospital room?: A Lot Help needed climbing 3-5 steps with a railing? : Total 6 Click Score: 10    End of Session Equipment Utilized During Treatment: Gait belt Activity Tolerance: Patient limited by fatigue Patient left: in chair;with call bell/phone within reach;with chair alarm set Nurse Communication: Mobility status PT Visit Diagnosis: Unsteadiness on feet (R26.81);Muscle weakness (generalized) (M62.81);Other abnormalities of gait and mobility (R26.89)    Time: 3568-6168 PT Time Calculation (min) (ACUTE ONLY): 27 min   Charges:   PT Evaluation $PT Eval Moderate Complexity: 1 Procedure PT Treatments $Therapeutic Activity: 8-22 mins   PT G CodesMagda Kiel, [place] [phone] 11/10/2016   Reginia Naas 11/10/2016, 1:03 PM

## 2016-11-10 NOTE — Progress Notes (Signed)
PROGRESS NOTE   [name] Michael  TIR:[id number]    DOB: 1960/10/14    DOA: 11/08/2016  PCP: Arnoldo Morale, MD   I have briefly reviewed patients previous medical records in [facility].  Brief Narrative:  56 year old female with a PMH of DM 2, HTN, HLD, stroke, a resident of a retirement facility, stage 4 chronic kidney disease with baseline creatinine of 2.2 (Dr. Florene Glen, nephrology), hospitalized 5/2-5/44 sepsis secondary to CAP, ESBL Escherichia coli UTI, AKI and nephrolithiasis with right hydronephrosis, completed course of amoxicillin and azithromycin, presented with 2 days history of abdominal pain, distention, nausea, vomiting, loose stools, decreased urine output. Admitted for acute on stage IV chronic kidney disease, hypercalcemia, nausea and vomiting. Nephrology consulted.Nausea, vomiting, mental status and hypercalcemia improving. Acute kidney injury slowly improving.   Assessment & Plan:   Principal Problem:   Hypercalcemia Active Problems:   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 (HCC)   Abdominal pain   AKI (acute kidney injury) ([place])   Hyponatremia   Hypovolemia   Accelerated hypertension   Acute lower UTI   1. Acute on stage IV chronic kidney disease: Nephrology consultation appreciated. Baseline creatinine 2.2. Suspect primarily due to hypercalcemia and volume depletion. Mildly uremic on admission but try to correct without dialysis for now. Ongoing aggressive IV fluid hydration, strict intake output (has Foley catheter)-great urine output (4 L on 5/20). Follow BMP every 6 hours. Creatinine gradually improving (8 > 7.8 > 7.51.). Management per nephrology. 2. Hypercalcemia: Presumed from milk alkali due to Tums for heartburn. Follow PTH-pending. Holding vitamin D and calcium supplements. High-dose IV normal saline (150 mL per hour) and low-dose Lasix per nephrology. Also on calcitonin. Nephrology doubts myeloma, not anemic. Calcium has i  calcium continues to improve, 13.3 > 12.5 >10.8. 3. Asymptomatic bacteriuria: Was treated empirically with IV cefepime. Urine culture however shows <10,000 colonies, insignificant growth. Discontinue cefepime. 4. Acute toxic metabolic encephalopathy: Secondary to uremia from acute kidney injury and electrolyte abnormalities, hypercalcemia. No focal deficits. Treat underlying cause and monitor closely. Improving. Continue to monitor clinically. 5. Essential hypertension: Uncontrolled. Continue oral metoprolol and hydralazine. When necessary IV hydralazine. May consider adding amlodipine. 6. Hyperlipidemia: Statins. 7. Dehydration with hyponatremia: IV normal saline and follow BMP closely. Sodium has improved to 131. Dehydration improving. 8. Anemia: Hemoglobin has dropped from 11.5 > 10.3, possibly dilutional. No overt bleeding reported. Follow CBC in a.m. 9. Nausea, vomiting and questionable diarrhea:? Related to mild uremia from acute kidney injury versus acute GE. Low suspicion for C. difficile. Had 2 episodes of mild nonbloody emesis overnight 5/21. Per nursing, had large BM, not diarrhea overnight. C. difficile testing negative. Diet as tolerated. 10. History of CVA: Continue aspirin, statins. No new focal neurological deficits. 11. Chronic debility: Resides at retirement facility because she needs help with her medications. Requested PT evaluation. 12. Type II DM: Reasonable inpatient control. Continue SSI. Holding long-acting insulin for now. Mild fluctuations. 13. GERD: On famotidine, adjusted to renal dose and PPI. 14. Resolved right hydronephrosis and distal right ureteric stone: By CT scan. 15. Constipation: Aggressive bowel regimen initiated. May be complicated by hypercalcemia.? Overflow incontinence. Had BM last night.   DVT prophylaxis: Subcutaneous heparin Code Status: Full Family Communication: None at bedside Disposition: Admitted to stepdown. PT evaluation.? Return to retirement  facility versus SNF, when stable.   Consultants:  Nephrology   Procedures:  Foley catheter  Antimicrobials:  None    Subjective: Seen this morning. Denies complaints. No pain  reported. No dyspnea. As per RN, 2 episodes of small nonbloody emesis overnight, last one at 4 AM. Also had large BM, not diarrhea. Slow mentation.  ROS: No chest pain or dyspnea.  Objective:  Vitals:   11/10/16 0416 11/10/16 0430 11/10/16 0752 11/10/16 0946  BP: (!) 203/85 140/67  (!) 152/70  Pulse:  90  86  Resp:  17    Temp:   98.1 F (36.7 C)   TempSrc:   Oral   SpO2:  98%    Weight:      Height:        Examination:  General exam: Small built and nourished pleasant middle-aged female lying comfortably supine in bed. Respiratory system: Clear to auscultation without wheezing, rhonchi or crackles. No increased work of breathing. Cardiovascular system: S1 and S2 heard, RRR. No JVD, murmurs or pedal edema. Telemetry: Sinus rhythm. Gastrointestinal system: Abdomen is nondistended, soft and nontender. Normal bowel sounds heard. Central nervous system: Alert and oriented 3 without focal neurological deficits. Extremities: Symmetric 5 x 5 power. Symmetric pulses felt. No cyanosis, clubbing or edema. Skin: No rashes, lesions or ulcers Psychiatry: Judgement and insight impaired. Mood & affect appropriate, pleasant.     Data Reviewed: I have personally reviewed following labs and imaging studies  CBC:  Recent Labs Lab 11/08/16 2107 11/08/16 2321 11/08/16 2343 11/09/16 0719 11/10/16 0720  WBC 10.7*  --   --  10.4 9.4  NEUTROABS 8.1*  --   --  8.4*  --   HGB 12.0 7.5* 12.9 11.5* 10.3*  HCT 33.0* 22.0* 38.0 33.3* 30.5*  MCV 83.5  --   --  84.7 85.9  PLT 335  --   --  363 161   Basic Metabolic Panel:  Recent Labs Lab 11/09/16 0719 11/09/16 1122 11/09/16 1817 11/09/16 2355 11/10/16 0514  NA 123* 126* 126* 129* 131*  K 4.8 5.1 4.7 4.2 4.7  CL 82* 87* 89* 95* 96*  CO2 27 28 25 22  19*   GLUCOSE 127* 130* 156* 193* 206*  BUN 66* 65* 64* 62* 64*  CREATININE 7.71* 7.69* 8.00* 7.80* 7.51*  CALCIUM 12.5* 12.1* 12.0* 11.0* 10.8*  PHOS 5.2* 5.4* 6.2* 6.2* 6.0*   Liver Function Tests:  Recent Labs Lab 11/09/16 0719 11/09/16 1122 11/09/16 1817 11/09/16 2355 11/10/16 0514  AST 18  --   --   --   --   ALT 15  --   --   --   --   ALKPHOS 62  --   --   --   --   BILITOT 0.7  --   --   --   --   PROT 7.1  --   --   --   --   ALBUMIN 3.1* 2.9* 3.2* 2.8* 3.2*   Coagulation Profile:  Recent Labs Lab 11/09/16 0719  INR 1.01   CBG:  Recent Labs Lab 11/08/16 2103 11/09/16 0912 11/09/16 1211 11/09/16 1720 11/09/16 2240  GLUCAP 134* 137* 121* 136* 203*    Recent Results (from the past 240 hour(s))  Culture, blood (routine x 2)     Status: None (Preliminary result)   Collection Time: 11/09/16 12:25 AM  Result Value Ref Range Status   Specimen Description BLOOD BLOOD LEFT HAND  Final   Special Requests IN PEDIATRIC BOTTLE Blood Culture adequate volume  Final   Culture   Final    NO GROWTH < 24 HOURS Performed at [facility], [address] [place],  [place] [address]    Report Status PENDING  Incomplete  Culture, blood (routine x 2)     Status: None (Preliminary result)   Collection Time: 11/09/16 12:25 AM  Result Value Ref Range Status   Specimen Description BLOOD LEFT ANTECUBITAL  Final   Special Requests IN PEDIATRIC BOTTLE Blood Culture adequate volume  Final   Culture   Final    NO GROWTH < 24 HOURS Performed at [facility], [place] [address], [place], [place] [address]    Report Status PENDING  Incomplete  Urine culture     Status: Abnormal   Collection Time: 11/09/16  1:10 AM  Result Value Ref Range Status   Specimen Description URINE, RANDOM  Final   Special Requests NONE  Final   Culture (A)  Final    <10,000 COLONIES/mL INSIGNIFICANT GROWTH Performed at [facility] [address], [place], [place] [address]    Report  Status 11/10/2016 FINAL  Final  C difficile quick scan w PCR reflex     Status: None   Collection Time: 11/10/16 12:55 AM  Result Value Ref Range Status   C Diff antigen NEGATIVE NEGATIVE Final   C Diff toxin NEGATIVE NEGATIVE Final   C Diff interpretation No C. difficile detected.  Final         Radiology Studies: Ct Abdomen Pelvis Wo Contrast  Result Date: 11/09/2016 CLINICAL DATA:  Vomiting and upper abdominal pain. EXAM: CT ABDOMEN AND PELVIS WITHOUT CONTRAST TECHNIQUE: Multidetector CT imaging of the abdomen and pelvis was performed following the standard protocol without IV contrast. COMPARISON:  CT 10/22/2016, additional priors FINDINGS: Lower chest: Motion artifact.  No pleural fluid or consolidation. Hepatobiliary: No focal hepatic lesion. Gallbladder is decompressed, no calcified stone. No biliary dilatation. Pancreas: A few chronic pancreatic head calcifications. No ductal dilatation or inflammation. Spleen: Normal in size without focal abnormality. Adrenals/Urinary Tract: Enlarged edematous appearing kidneys with perinephric edema, chronic finding unchanged from December 2017. Previous right hydronephrosis has resolved. Previous stone in the distal right ureter is no longer seen. No left hydronephrosis. Bilateral nonobstructing intrarenal calculi are again seen. Urinary bladder is physiologically distended without wall thickening. No adrenal nodule. Stomach/Bowel: Stomach distended with ingested contrast, no gastric wall thickening. No bowel obstruction or inflammation. Moderate diffuse stool burden throughout the colon. Air and enteric contrast in the proximal appendix, no evidence of appendicitis. Vascular/Lymphatic: Aortic and branch atherosclerosis. No adenopathy. Reproductive: Uterus and bilateral adnexa are unremarkable. Other: No free air or free fluid.  No intra-abdominal abscess. Musculoskeletal: There are no acute or suspicious osseous abnormalities. IMPRESSION: 1. Resolved  right hydronephrosis and distal right ureteric stone. 2. Enlarged edematous kidneys with perinephric edema, a chronic finding and unchanged over the past 6 months. Bilateral nonobstructing renal calculi. 3. No new abnormality. 4. Moderate stool burden suggesting constipation. Aortic atherosclerosis without aneurysm. Electronically Signed   By: Jeb Levering M.D.   On: 11/09/2016 01:19   Dg Abd Acute W/chest  Result Date: 11/08/2016 CLINICAL DATA:  Abdominal pain and vomiting.  Somnolence. EXAM: DG ABDOMEN ACUTE W/ 1V CHEST COMPARISON:  10/22/2016 CT abdomen and pelvis FINDINGS: No bowel obstruction. Increased colonic stool burden noted from cecum through rectum. Faint linear calcification may correspond with the known right UVJ stone versus stool particle noted in the right hemipelvis.Heart size and mediastinal contours are within normal limits. Minimal atelectasis at the lung bases. Faint opacity at the left costophrenic angle on the frontal view of the chest does not persist  on additional imaging and may be due to overlap of the ribs and breast tissue. IMPRESSION: No acute cardiopulmonary disease. Increased colonic stool burden consistent with constipation. There is a linear tiny calcification in the right hemipelvis which could potentially represent the known right UVJ stone, vascular calcification or tiny focus of stool within overlying bowel. Electronically Signed   By: Ashley Royalty M.D.   On: 11/08/2016 22:21        Scheduled Meds: . aspirin  81 mg Oral Daily  . atorvastatin  20 mg Oral q1800  . calcitonin  160 Units Subcutaneous BID  . famotidine  20 mg Oral Daily  . feeding supplement  1 Container Oral TID BM  . furosemide  40 mg Intravenous Q12H  . heparin  5,000 Units Subcutaneous Q8H  . hydrALAZINE  25 mg Oral Q6H  . insulin aspart  0-9 Units Subcutaneous TID WC  . metoprolol tartrate  100 mg Oral BID  . pantoprazole  40 mg Oral Daily  . polyethylene glycol  17 g Oral Daily  .  senna  2 tablet Oral BID  . sodium chloride flush  3 mL Intravenous Q12H  . triamcinolone cream  1 application Topical TID   Continuous Infusions: . sodium chloride 150 mL/hr at 11/10/16 0425     LOS: 1 day     Alexa Golebiewski, MD, FACP, FHM. [facility] Pager [phone]  If 7PM-7AM, please contact night-coverage [email or web address] Password [email or web address] 11/10/2016, 10:49 AM

## 2016-11-10 NOTE — Clinical Social Work Note (Signed)
Clinical Social Work Assessment  Patient Details  Name: [name] Michael MRN: [id number] Date of Birth: 06-30-1960  Date of referral:  11/10/16               Reason for consult:  Facility Placement, Discharge Planning                Permission sought to share information with:  Facility Sport and exercise psychologist, Family Supports Permission granted to share information::  Yes, Verbal Permission Granted  Name::     Deloria Lair  Agency::  [facility]  Relationship::  Sister   Contact Information:  [phone]  Housing/Transportation Living arrangements for the past 2 months:  [place] of Information:  Patient Patient Interpreter Needed:  None Criminal Activity/Legal Involvement Pertinent to Current Situation/Hospitalization:  No - Comment as needed Significant Relationships:  Siblings (sister, [name]) Lives with:  Facility Resident Do you feel safe going back to the place where you live?  Yes Need for family participation in patient care:  No (Coment)  Care giving concerns: Patient presented from [facility] [place] and is agreeable to return. PT recommends rehab for patient due to decreased independence with mobility.  Social Worker assessment / plan:  CSW received consult for possible rehab placement at time of discharge. Patient presented from [facility]. CSW consulted with ALF staff who confirmed facility can continue to provide PT for patient. CSW spoke with patient regarding PT recommendation. Patient is agreeable to return to ALF. CSW to continue to follow and assist with discharge planning needs.  Employment status:  Disabled (Comment on whether or not currently receiving Disability) Insurance information:  Medicaid In [place] PT Recommendations:  [place] / Referral to community resources:  [place]  Patient/Family's Response to care: Patient recognizes the need for rehab and gave verbal permission for CSW to  coordinate care with her sister.  Patient/Family's Understanding of and Emotional Response to Diagnosis, Current Treatment, and Prognosis: Patient was lethargic and slow to answer questions during assessment. Patient reported understanding of need for PT and was amenable to plan to discharge back to ALF.   Emotional Assessment Appearance:  Appears stated age Attitude/Demeanor/Rapport:  Lethargic Affect (typically observed):  Calm, Stoic, Quiet Orientation:  Oriented to Self, Oriented to Place, Oriented to  Time Alcohol / Substance use:  Not Applicable Psych involvement (Current and /or in the community):  No (Comment)  Discharge Needs  Concerns to be addressed:  Discharge Planning Concerns, Care Coordination Readmission within the last 30 days:  Yes Current discharge risk:  Dependent with Mobility Barriers to Discharge:  Continued Medical Work up   Estanislado Emms, LCSW 11/10/2016, 3:40 PM

## 2016-11-10 NOTE — Plan of Care (Signed)
Problem: Education: Goal: Knowledge of [facility] Education information/materials will improve Outcome: Progressing Patient educated and updated on plan of care. Any interactions with patient she demonstrates extremely delayed responses, but voices understanding. Continue updating patient on her care. Call light within reach and patient knows how to use.   Problem: Safety: Goal: Ability to remain free from injury will improve Outcome: Progressing Call light within reach and bed alarm on.  Problem: Health Behavior/Discharge Planning: Goal: Ability to manage health-related needs will improve Outcome: Progressing Patient will return to SNF when appropriate for discharge.  Problem: Physical Regulation: Goal: Ability to maintain clinical measurements within normal limits will improve Outcome: Progressing Calcium levels slowly improving. Nephrology planning to watch patient closely.  Goal: Will remain free from infection Outcome: Progressing Patient on Enteric precautions- will check for CDIFF.  Problem: Skin Integrity: Goal: Risk for impaired skin integrity will decrease Outcome: Progressing Patient has MASD on her bottom - barrier cream applied. Patient able to turn self.   Problem: Tissue Perfusion: Goal: Risk factors for ineffective tissue perfusion will decrease Outcome: Progressing Heparin subcutaneously.  Problem: Fluid Volume: Goal: Ability to maintain a balanced intake and output will improve Outcome: Progressing Foley in place for strict monitoring.   Problem: Nutrition: Goal: Adequate nutrition will be maintained Outcome: Not Progressing Patient has very poor appetite and on clear liquid diet.   Problem: Bowel/Gastric: Goal: Will not experience complications related to bowel motility Outcome: Not Progressing Patient having incontinent loose stools. Sample sent to lab.

## 2016-11-10 NOTE — Progress Notes (Signed)
[facility] ROUNDING NOTE   Subjective:   Interval History: 56 y.o. year-old with history of HTN, DM2 , HL, CVA. Lives in retirement facility to get help w taking medications (per pt).  Has CKD baseline creat 2.2, f/b Dr Florene Glen at [facility] per pt.  Presenting with 2 day hx of abd pain and distension and nausea/ vomiting. Questionable diarrhea. Recent admit for ESBL EColi UTI with sepsis. In ED creat was high 6-9 range, CA high 13 , serum HCO3 high at 35.   Objective:  Vital signs in last 24 hours:  Temp:  [98.1 F (36.7 C)-98.7 F (37.1 C)] 98.1 F (36.7 C) (05/21 0752) Pulse Rate:  [78-90] 86 (05/21 0946) Resp:  [13-20] 17 (05/21 0430) BP: (135-205)/(67-108) 152/70 (05/21 0946) SpO2:  [95 %-99 %] 98 % (05/21 0430) Weight:  [194 lb 0.1 oz (88 kg)] 194 lb 0.1 oz (88 kg) (05/21 1151)  Weight change:  Filed Weights   11/08/16 1959 11/10/16 1146 11/10/16 1151  Weight: 88 lb (39.9 kg) 194 lb 0.1 oz (88 kg) 194 lb 0.1 oz (88 kg)    Intake/Output: I/O last 3 completed shifts: In: 4064.3 [P.O.:180; I.V.:3834.3; IV Piggyback:50] Out: 5176 [Urine:5175; Stool:1]   Intake/Output this shift:  Total I/O In: -  Out: 850 [Urine:850]  CVS- RRR RS- CTA ABD- BS present soft non-distended EXT- no edema   Basic Metabolic Panel:  Recent Labs Lab 11/09/16 0719 11/09/16 1122 11/09/16 1817 11/09/16 2355 11/10/16 0514  NA 123* 126* 126* 129* 131*  K 4.8 5.1 4.7 4.2 4.7  CL 82* 87* 89* 95* 96*  CO2 27 28 25 22  19*  GLUCOSE 127* 130* 156* 193* 206*  BUN 66* 65* 64* 62* 64*  CREATININE 7.71* 7.69* 8.00* 7.80* 7.51*  CALCIUM 12.5* 12.1* 12.0* 11.0* 10.8*  PHOS 5.2* 5.4* 6.2* 6.2* 6.0*    Liver Function Tests:  Recent Labs Lab 11/09/16 0719 11/09/16 1122 11/09/16 1817 11/09/16 2355 11/10/16 0514  AST 18  --   --   --   --   ALT 15  --   --   --   --   ALKPHOS 62  --   --   --   --   BILITOT 0.7  --   --   --   --   PROT 7.1  --   --   --   --   ALBUMIN 3.1* 2.9*  3.2* 2.8* 3.2*   No results for input(s): LIPASE, AMYLASE in the last 168 hours. No results for input(s): AMMONIA in the last 168 hours.  CBC:  Recent Labs Lab 11/08/16 2107 11/08/16 2321 11/08/16 2343 11/09/16 0719 11/10/16 0720  WBC 10.7*  --   --  10.4 9.4  NEUTROABS 8.1*  --   --  8.4*  --   HGB 12.0 7.5* 12.9 11.5* 10.3*  HCT 33.0* 22.0* 38.0 33.3* 30.5*  MCV 83.5  --   --  84.7 85.9  PLT 335  --   --  363 329    Cardiac Enzymes: No results for input(s): CKTOTAL, CKMB, CKMBINDEX, TROPONINI in the last 168 hours.  BNP: Invalid input(s): POCBNP  CBG:  Recent Labs Lab 11/08/16 2103 11/09/16 0912 11/09/16 1211 11/09/16 1720 11/09/16 2240  GLUCAP 134* 137* 121* 136* 79*    Microbiology: Results for orders placed or performed during the hospital encounter of 11/08/16  Culture, blood (routine x 2)     Status: None (Preliminary result)   Collection Time: 11/09/16  12:25 AM  Result Value Ref Range Status   Specimen Description BLOOD BLOOD LEFT HAND  Final   Special Requests IN PEDIATRIC BOTTLE Blood Culture adequate volume  Final   Culture   Final    NO GROWTH < 24 HOURS Performed at [facility], [address], [place], [place] [address]    Report Status PENDING  Incomplete  Culture, blood (routine x 2)     Status: None (Preliminary result)   Collection Time: 11/09/16 12:25 AM  Result Value Ref Range Status   Specimen Description BLOOD LEFT ANTECUBITAL  Final   Special Requests IN PEDIATRIC BOTTLE Blood Culture adequate volume  Final   Culture   Final    NO GROWTH < 24 HOURS Performed at [facility], [place] [address] [place], [place] [address]    Report Status PENDING  Incomplete  Urine culture     Status: Abnormal   Collection Time: 11/09/16  1:10 AM  Result Value Ref Range Status   Specimen Description URINE, RANDOM  Final   Special Requests NONE  Final   Culture (A)  Final    <10,000 COLONIES/mL INSIGNIFICANT GROWTH Performed at [facility] [address] [place], [place] [address]    Report Status 11/10/2016 FINAL  Final  C difficile quick scan w PCR reflex     Status: None   Collection Time: 11/10/16 12:55 AM  Result Value Ref Range Status   C Diff antigen NEGATIVE NEGATIVE Final   C Diff toxin NEGATIVE NEGATIVE Final   C Diff interpretation No C. difficile detected.  Final    Coagulation Studies:  Recent Labs  11/09/16 0719  LABPROT 13.3  INR 1.01    Urinalysis:  Recent Labs  11/09/16 0110  COLORURINE STRAW*  LABSPEC 1.005  PHURINE 9.0*  GLUCOSEU 50*  HGBUR SMALL*  BILIRUBINUR NEGATIVE  KETONESUR NEGATIVE  PROTEINUR NEGATIVE  NITRITE NEGATIVE  LEUKOCYTESUR LARGE*      Imaging: Ct Abdomen Pelvis Wo Contrast  Result Date: 11/09/2016 CLINICAL DATA:  Vomiting and upper abdominal pain. EXAM: CT ABDOMEN AND PELVIS WITHOUT CONTRAST TECHNIQUE: Multidetector CT imaging of the abdomen and pelvis was performed following the standard protocol without IV contrast. COMPARISON:  CT 10/22/2016, additional priors FINDINGS: Lower chest: Motion artifact.  No pleural fluid or consolidation. Hepatobiliary: No focal hepatic lesion. Gallbladder is decompressed, no calcified stone. No biliary dilatation. Pancreas: A few chronic pancreatic head calcifications. No ductal dilatation or inflammation. Spleen: Normal in size without focal abnormality. Adrenals/Urinary Tract: Enlarged edematous appearing kidneys with perinephric edema, chronic finding unchanged from December 2017. Previous right hydronephrosis has resolved. Previous stone in the distal right ureter is no longer seen. No left hydronephrosis. Bilateral nonobstructing intrarenal calculi are again seen. Urinary bladder is physiologically distended without wall thickening. No adrenal nodule. Stomach/Bowel: Stomach distended with ingested contrast, no gastric wall thickening. No bowel obstruction or inflammation. Moderate diffuse stool burden throughout the colon.  Air and enteric contrast in the proximal appendix, no evidence of appendicitis. Vascular/Lymphatic: Aortic and branch atherosclerosis. No adenopathy. Reproductive: Uterus and bilateral adnexa are unremarkable. Other: No free air or free fluid.  No intra-abdominal abscess. Musculoskeletal: There are no acute or suspicious osseous abnormalities. IMPRESSION: 1. Resolved right hydronephrosis and distal right ureteric stone. 2. Enlarged edematous kidneys with perinephric edema, a chronic finding and unchanged over the past 6 months. Bilateral nonobstructing renal calculi. 3. No new abnormality. 4. Moderate stool burden suggesting constipation. Aortic atherosclerosis without aneurysm. Electronically  Signed   By: Jeb Levering M.D.   On: 11/09/2016 01:19   Dg Abd Acute W/chest  Result Date: 11/08/2016 CLINICAL DATA:  Abdominal pain and vomiting.  Somnolence. EXAM: DG ABDOMEN ACUTE W/ 1V CHEST COMPARISON:  10/22/2016 CT abdomen and pelvis FINDINGS: No bowel obstruction. Increased colonic stool burden noted from cecum through rectum. Faint linear calcification may correspond with the known right UVJ stone versus stool particle noted in the right hemipelvis.Heart size and mediastinal contours are within normal limits. Minimal atelectasis at the lung bases. Faint opacity at the left costophrenic angle on the frontal view of the chest does not persist on additional imaging and may be due to overlap of the ribs and breast tissue. IMPRESSION: No acute cardiopulmonary disease. Increased colonic stool burden consistent with constipation. There is a linear tiny calcification in the right hemipelvis which could potentially represent the known right UVJ stone, vascular calcification or tiny focus of stool within overlying bowel. Electronically Signed   By: Ashley Royalty M.D.   On: 11/08/2016 22:21     Medications:   . sodium chloride 150 mL/hr at 11/10/16 0425   . aspirin  81 mg Oral Daily  . atorvastatin  20 mg Oral  q1800  . calcitonin  160 Units Subcutaneous BID  . famotidine  20 mg Oral Daily  . feeding supplement  1 Container Oral TID BM  . furosemide  40 mg Intravenous Q12H  . heparin  5,000 Units Subcutaneous Q8H  . hydrALAZINE  25 mg Oral Q6H  . insulin aspart  0-9 Units Subcutaneous TID WC  . metoprolol tartrate  100 mg Oral BID  . pantoprazole  40 mg Oral Daily  . polyethylene glycol  17 g Oral Daily  . saccharomyces boulardii  250 mg Oral BID  . senna  2 tablet Oral BID  . sodium chloride flush  3 mL Intravenous Q12H  . triamcinolone cream  1 application Topical TID   acetaminophen **OR** acetaminophen, bisacodyl, hydrALAZINE, ondansetron **OR** ondansetron (ZOFRAN) IV  Assessment/ Plan:    Acute on CKD 4 - due to hyperCa++ primarily, maybe vol depleted as well. Mildly uremic, will try to correct w/o dialysis for now.  2  CKD 4 - baseline creat 2.2, f/b Dr Rubie Maid  Continues to slowly improve will follow  3  Hypercalcemia -  . Give high flow 0.9% saline w low dose lasix for correction.  Also calcitonin ordered per primary. Not anemic, doubt myeloma 4  Hx CVA  5  Chron debility - is in retirement facility because she needs "help with her medications".   6  HTN 7  HL     LOS: 1 [name] Michael W @TODAY @12 :03 PM

## 2016-11-11 LAB — CBC
HEMATOCRIT: 28.4 % — AB (ref 36.0–46.0)
HEMOGLOBIN: 9.3 g/dL — AB (ref 12.0–15.0)
MCH: 28.5 pg (ref 26.0–34.0)
MCHC: 32.7 g/dL (ref 30.0–36.0)
MCV: 87.1 fL (ref 78.0–100.0)
Platelets: 309 10*3/uL (ref 150–400)
RBC: 3.26 MIL/uL — AB (ref 3.87–5.11)
RDW: 12.7 % (ref 11.5–15.5)
WBC: 10.3 10*3/uL (ref 4.0–10.5)

## 2016-11-11 LAB — RENAL FUNCTION PANEL
ANION GAP: 12 (ref 5–15)
Albumin: 2.9 g/dL — ABNORMAL LOW (ref 3.5–5.0)
BUN: 56 mg/dL — ABNORMAL HIGH (ref 6–20)
CHLORIDE: 105 mmol/L (ref 101–111)
CO2: 19 mmol/L — AB (ref 22–32)
Calcium: 9.5 mg/dL (ref 8.9–10.3)
Creatinine, Ser: 7.09 mg/dL — ABNORMAL HIGH (ref 0.44–1.00)
GFR, EST AFRICAN AMERICAN: 7 mL/min — AB (ref >60)
GFR, EST NON AFRICAN AMERICAN: 6 mL/min — AB (ref >60)
Glucose, Bld: 171 mg/dL — ABNORMAL HIGH (ref 65–99)
POTASSIUM: 3.6 mmol/L (ref 3.5–5.1)
Phosphorus: 5.6 mg/dL — ABNORMAL HIGH (ref 2.5–4.6)
Sodium: 136 mmol/L (ref 135–145)

## 2016-11-11 LAB — GLUCOSE, CAPILLARY
GLUCOSE-CAPILLARY: 168 mg/dL — AB (ref 65–99)
GLUCOSE-CAPILLARY: 167 mg/dL — AB (ref 65–99)
GLUCOSE-CAPILLARY: 91 mg/dL (ref 65–99)
Glucose-Capillary: 234 mg/dL — ABNORMAL HIGH (ref 65–99)

## 2016-11-11 NOTE — Care Management Note (Addendum)
Case Management Note  Patient Details  Name: CAMI DELAWDER MRN: [id number] Date of Birth: February 28, 1961  Subjective/Objective:  From [facility],with hx of DM, htn, hld, CVA, stage 4 chronic kidney dz, , presents with n/v, hypercalcemia, abd pain, aki, hypovolemia, accelerated htn, uti.   Will need HHPT at ALF at discharge, will need to put on FL2 .  She has [employer].  5/25 1525- Received call from Clarise Cruz at [facility] states patient is active for [facility], [place], [place]. Will need resumption orders.   PCP listed  Dr. Arnoldo Morale                Action/Plan: NCM will follow along with CSW for dc needs.  Expected Discharge Date:                  Expected Discharge Plan:  Assisted Living / Rest Home  In-House Referral:  Clinical Social Work  Discharge planning Services  CM Consult  Post Acute Care Choice:    Choice offered to:     DME Arranged:    DME Agency:     HH Arranged:    HH Agency:     Status of Service:  In process, will continue to follow  If discussed at Long Length of Stay Meetings, dates discussed:    Additional Comments:  Zenon Mayo, RN 11/11/2016, 5:23 PM

## 2016-11-11 NOTE — Progress Notes (Signed)
[facility] ROUNDING NOTE   Subjective:   Interval History: Interval History: 56 y.o.year-old with history of HTN, DM2 , HL, CVA. Lives in retirement facility to get help w taking medications (per pt). Has CKD baseline creat 2.2, f/b Dr Florene Glen at [facility] per pt. Presenting with 2 day hx of abd pain and distension and nausea/ vomiting. Questionable diarrhea. Recent admit for ESBL EColi UTI with sepsis. In ED creat was high 6-9 range, CA high 13 , serum HCO3 high at 35.   Objective:  Vital signs in last 24 hours:  Temp:  [97.9 F (36.6 C)-98.2 F (36.8 C)] 98.2 F (36.8 C) (05/22 0350) Pulse Rate:  [79-91] 91 (05/22 0350) Resp:  [13-24] 16 (05/22 0350) BP: (144-211)/(66-95) 159/71 (05/22 0350) SpO2:  [97 %-99 %] 98 % (05/22 0350) Weight:  [88 lb (39.9 kg)-194 lb 0.1 oz (88 kg)] 88 lb (39.9 kg) (05/21 1534)  Weight change:  Filed Weights   11/10/16 1146 11/10/16 1151 11/10/16 1534  Weight: 194 lb 0.1 oz (88 kg) 194 lb 0.1 oz (88 kg) 88 lb (39.9 kg)    Intake/Output: I/O last 3 completed shifts: In: 7412 [P.O.:240; I.V.:5253] Out: 5091 [Urine:5090; Stool:1]   Intake/Output this shift:  No intake/output data recorded.  CVS- RRR RS- CTA ABD- BS present soft non-distended EXT- no edema   Basic Metabolic Panel:  Recent Labs Lab 11/09/16 1817 11/09/16 2355 11/10/16 0514 11/10/16 1118 11/11/16 0238  NA 126* 129* 131* 133* 136  K 4.7 4.2 4.7 3.9 3.6  CL 89* 95* 96* 101 105  CO2 25 22 19* 19* 19*  GLUCOSE 156* 193* 206* 215* 171*  BUN 64* 62* 64* 62* 56*  CREATININE 8.00* 7.80* 7.51* 7.39* 7.09*  CALCIUM 12.0* 11.0* 10.8* 10.4* 9.5  PHOS 6.2* 6.2* 6.0* 5.8* 5.6*    Liver Function Tests:  Recent Labs Lab 11/09/16 0719  11/09/16 1817 11/09/16 2355 11/10/16 0514 11/10/16 1118 11/11/16 0238  AST 18  --   --   --   --   --   --   ALT 15  --   --   --   --   --   --   ALKPHOS 62  --   --   --   --   --   --   BILITOT 0.7  --   --   --   --   --   --    PROT 7.1  --   --   --   --   --   --   ALBUMIN 3.1*  < > 3.2* 2.8* 3.2* 3.0* 2.9*  < > = values in this interval not displayed. No results for input(s): LIPASE, AMYLASE in the last 168 hours. No results for input(s): AMMONIA in the last 168 hours.  CBC:  Recent Labs Lab 11/08/16 2107 11/08/16 2321 11/08/16 2343 11/09/16 0719 11/10/16 0720 11/11/16 0238  WBC 10.7*  --   --  10.4 9.4 10.3  NEUTROABS 8.1*  --   --  8.4*  --   --   HGB 12.0 7.5* 12.9 11.5* 10.3* 9.3*  HCT 33.0* 22.0* 38.0 33.3* 30.5* 28.4*  MCV 83.5  --   --  84.7 85.9 87.1  PLT 335  --   --  363 329 309    Cardiac Enzymes: No results for input(s): CKTOTAL, CKMB, CKMBINDEX, TROPONINI in the last 168 hours.  BNP: Invalid input(s): POCBNP  CBG:  Recent Labs Lab 11/09/16 1720 11/09/16  2240 11/10/16 0904 11/10/16 1632 11/10/16 2137  GLUCAP 136* 203* 184* 265* 150*    Microbiology: Results for orders placed or performed during the hospital encounter of 11/08/16  Culture, blood (routine x 2)     Status: None (Preliminary result)   Collection Time: 11/09/16 12:25 AM  Result Value Ref Range Status   Specimen Description BLOOD BLOOD LEFT HAND  Final   Special Requests IN PEDIATRIC BOTTLE Blood Culture adequate volume  Final   Culture   Final    NO GROWTH < 24 HOURS Performed at [facility], [place] [address], [place], [place] [address]    Report Status PENDING  Incomplete  Culture, blood (routine x 2)     Status: None (Preliminary result)   Collection Time: 11/09/16 12:25 AM  Result Value Ref Range Status   Specimen Description BLOOD LEFT ANTECUBITAL  Final   Special Requests IN PEDIATRIC BOTTLE Blood Culture adequate volume  Final   Culture   Final    NO GROWTH < 24 HOURS Performed at [facility], [place] [address] [place], [place] [address]    Report Status PENDING  Incomplete  Urine culture     Status: Abnormal   Collection Time: 11/09/16  1:10 AM  Result Value Ref Range Status    Specimen Description URINE, RANDOM  Final   Special Requests NONE  Final   Culture (A)  Final    <10,000 COLONIES/mL INSIGNIFICANT GROWTH Performed at [facility] [address], [place], [place] [address]    Report Status 11/10/2016 FINAL  Final  C difficile quick scan w PCR reflex     Status: None   Collection Time: 11/10/16 12:55 AM  Result Value Ref Range Status   C Diff antigen NEGATIVE NEGATIVE Final   C Diff toxin NEGATIVE NEGATIVE Final   C Diff interpretation No C. difficile detected.  Final    Coagulation Studies:  Recent Labs  11/09/16 0719  LABPROT 13.3  INR 1.01    Urinalysis:  Recent Labs  11/09/16 0110  COLORURINE STRAW*  LABSPEC 1.005  PHURINE 9.0*  GLUCOSEU 50*  HGBUR SMALL*  BILIRUBINUR NEGATIVE  KETONESUR NEGATIVE  PROTEINUR NEGATIVE  NITRITE NEGATIVE  LEUKOCYTESUR LARGE*      Imaging: No results found.   Medications:   . sodium chloride 150 mL/hr at 11/11/16 0701   . aspirin  81 mg Oral Daily  . atorvastatin  20 mg Oral q1800  . famotidine  20 mg Oral Daily  . feeding supplement  1 Container Oral TID BM  . furosemide  40 mg Intravenous Q12H  . heparin  5,000 Units Subcutaneous Q8H  . hydrALAZINE  25 mg Oral Q6H  . insulin aspart  0-9 Units Subcutaneous TID WC  . metoprolol tartrate  100 mg Oral BID  . pantoprazole  40 mg Oral Daily  . polyethylene glycol  17 g Oral Daily  . saccharomyces boulardii  250 mg Oral BID  . senna  2 tablet Oral BID  . sodium chloride flush  3 mL Intravenous Q12H  . triamcinolone cream  1 application Topical TID   acetaminophen **OR** acetaminophen, bisacodyl, hydrALAZINE, ondansetron **OR** ondansetron (ZOFRAN) IV  Assessment/ Plan:  1. Acute on CKD 4 - due to hyperCa++ recovery is going to be slow 2 CKD 4 - baseline creat 2.2, f/b Dr Rubie Maid  Continues to slowly improve will follow. Renal function and labs are improving . I suspect this is going to by  a slow recovery to baseline,  but no  indictations for dialysis  3 Hypercalcemia -  . Improved  4 Hx CVA  5 Chron debility - is in retirement facility because she needs "help with her medications".  6 HTN 7 HL     LOS: 2 Yerik Zeringue W @TODAY @7 :57 AM

## 2016-11-11 NOTE — Progress Notes (Signed)
Inpatient Diabetes Program Recommendations  AACE/ADA: New Consensus Statement on Inpatient Glycemic Control (2015)  Target Ranges:  Prepandial:   less than 140 mg/dL      Peak postprandial:   less than 180 mg/dL (1-2 hours)      Critically ill patients:  140 - 180 mg/dL   Results for DORALENE, GLANZ (MRN [id number]) as of 11/11/2016 10:26  Ref. Range 11/10/2016 09:04 11/10/2016 16:32 11/10/2016 21:37 11/11/2016 07:54  Glucose-Capillary Latest Ref Range: 65 - 99 mg/dL 184 (H) 265 (H) 150 (H) 168 (H)   Review of Glycemic Control  Diabetes history: DM2 Outpatient Diabetes medications: Tradjenta 5 mg daily, 70/30 20 units BID Current orders for Inpatient glycemic control: Novolog 0-9 units TID with meals  Inpatient Diabetes Program Recommendations: Correction (SSI): Please consider ordering Novolog 0-5 units QHS for bedtime correction scale. Insulin - Meal Coverage: Please consider ordering Novolog 3 units TID with meals for meal coverage if patient eats at least 50% of meals. HgbA1C: A1C 10.9% on 04/29/16. Please consider ordering an A1C to evaluate glycemic control over the past 2-3 months.  Thanks, Barnie Alderman, RN, MSN, CDE Diabetes Coordinator Inpatient Diabetes Program [phone] (Team Pager from 8am to 5pm)

## 2016-11-11 NOTE — Progress Notes (Addendum)
Physical Therapy Treatment Patient Details Name: [name] Michael MRN: [id number] DOB: [birth year] Today's Date: 11/11/2016    History of Present Illness [name] Michael is a 56 y.o. woman with a history of CVA, HTN, Type 2 diabetes, and CKD 4 (baseline creatinine around 2.2) who was admitted from 5/2 - 5/4 for management of sepsis secondary to CAP, AKI, and nephrolithiasis with right hydronephrosis.  Patient admitted with N&V and was found to be hypovolemic hyponatremia and hypercalcemia.    PT Comments    Patient progressing with mobility achieving goals set already and goals updated.  She was able to walk this session and much more stable with standing able to brush teeth without UE support.  Feel she can be managed at her ALF upon d/c with follow up HHPT.    Follow Up Recommendations  Home health PT (at ALF)     Equipment Recommendations  Other (comment) (TBA)    Recommendations for Other Services       Precautions / Restrictions Precautions Precautions: Fall    Mobility  Bed Mobility Overal bed mobility: Needs Assistance Bed Mobility: Sit to Supine       Sit to supine: Min guard   General bed mobility comments: assist for line management  Transfers Overall transfer level: Needs assistance Equipment used: Rolling walker (2 wheeled) Transfers: Sit to/from Stand Sit to Stand: Min assist         General transfer comment: cues for hand placement  Ambulation/Gait Ambulation/Gait assistance: Min assist;+2 safety/equipment Ambulation Distance (Feet): 120 Feet Assistive device: Rolling walker (2 wheeled) Gait Pattern/deviations: Step-through pattern;Decreased stride length     General Gait Details: assist for balance/safety,    Stairs            Wheelchair Mobility    Modified Rankin (Stroke Patients Only)       Balance Overall balance assessment: Needs assistance Sitting-balance support: Feet unsupported Sitting balance-Leahy Scale: Good      Standing balance support: No upper extremity supported;During functional activity Standing balance-Leahy Scale: Fair Standing balance comment: standing at sink to brush teeth minguard for safety without UE support                            Cognition Arousal/Alertness: Awake/alert Behavior During Therapy: WFL for tasks assessed/performed Overall Cognitive Status: Within Functional Limits for tasks assessed                                        Exercises      General Comments General comments (skin integrity, edema, etc.): Patient standing prior to getting to bed and reported had to have BM and was incontinent of liquid stool.  Assist for hygiene prior to return to bed      Pertinent Vitals/Pain Faces Pain Scale: Hurts a little bit Pain Location: feet Pain Descriptors / Indicators: Sore Pain Intervention(s): Monitored during session    Home Living                      Prior Function            PT Goals (current goals can now be found in the care plan section) Progress towards PT goals: Goals met and updated - see care plan    Frequency           PT Plan Discharge plan  needs to be updated    Co-evaluation              AM-PAC PT "6 Clicks" Daily Activity  Outcome Measure  Difficulty turning over in bed (including adjusting bedclothes, sheets and blankets)?: A Little Difficulty moving from lying on back to sitting on the side of the bed? : Total Difficulty sitting down on and standing up from a chair with arms (e.g., wheelchair, bedside commode, etc,.)?: Total Help needed moving to and from a bed to chair (including a wheelchair)?: A Little Help needed walking in hospital room?: A Little Help needed climbing 3-5 steps with a railing? : A Lot 6 Click Score: 13    End of Session Equipment Utilized During Treatment: Gait belt Activity Tolerance: Patient tolerated treatment well Patient left: in bed;with call bell/phone  within reach   PT Visit Diagnosis: Unsteadiness on feet (R26.81);Muscle weakness (generalized) (M62.81);Other abnormalities of gait and mobility (R26.89)     Time: 5462-7035 PT Time Calculation (min) (ACUTE ONLY): 29 min  Charges:  $Gait Training: 8-22 mins $Therapeutic Activity: 8-22 mins                    G CodesMagda Kiel, [place] [phone] 11/11/2016    Reginia Naas 11/11/2016, 1:20 PM

## 2016-11-11 NOTE — Progress Notes (Signed)
PROGRESS NOTE   [name] Michael  HBZ:[id number]    DOB: 12-20-1960    DOA: 11/08/2016  PCP: Arnoldo Morale, MD   I have briefly reviewed patients previous medical records in [facility].  Brief Narrative:  56   DM 2,  HTN,  HLD,  stroke, a resident of a retirement facility,  stage 4 chronic kidney disease with baseline creatinine of 2.2 (Dr. Florene Glen, nephrology),   hospitalized 5/2-5/44 sepsis secondary to CAP, ESBL Escherichia coli UTI, AKI and nephrolithiasis with right hydronephrosis, completed course of amoxicillin and azithromycin, Re-admit 5/20 with 2 days history of abdominal pain, distention, nausea, vomiting, loose stools, decreased urine output.  Admitted for acute on stage IV chronic kidney disease, hypercalcemia, nausea and vomiting.  Nephrology consulted.   Assessment & Plan:   Principal Problem:   Hypercalcemia Active Problems:   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 (HCC)   Abdominal pain   AKI (acute kidney injury) ([place])   Hyponatremia   Hypovolemia   Accelerated hypertension   Acute lower UTI   Acute on stage IV chronic kidney disease-on admit bun/Creat was 65/6.2: Nephrology  appreciated. Baseline creatinine 2.2. Suspect 2/2 hypercalcemia / volume depletion. Mildly uremic on admission but try to correct without dialysis for now. Ongoing aggressive IV fluid hydration, strict intake output (has Foley catheter)-great urine output (6 L on 5/22). Follow BMP ever12 hr Creatinine gradually improving (8 > 7.8 > 7.51.). Management per nephrology. Hypercalcemia: Presumed from milk alkali due to Tums for heartburn.  PTH-Was 12 and total calcium was 13.2. Nephrology doubts myeloma, not anemic. Ionized calcium is 1.7Holding vitamin D and calcium supplements. High-dose IV normal saline (150 mL per hour) and low-dose Lasix per nephrology. Also on calcitonin.  Asymptomatic bacteriuria: Was treated empirically with IV cefepime. Urine culture  however shows <10,000 colonies, insignificant growth. Discontinued cefepime 5/21 Acute toxic metabolic encephalopathy: Secondary to uremia from acute kidney injury and electrolyte abnormalities, hypercalcemia. No focal deficits.  Essential hypertension: Uncontrolled. Continue oral metoprolol and hydralazine. When necessary IV hydralazine. May consider adding amlodipine. Hyperlipidemia: Statins. Dehydration with hyponatremia, Na 116 on admit: IV normal saline 150 cc/h and follow BMP closely. Sodium has improved to 136. Dehydration improving. Anemia: Hemoglobin has dropped from 11.5 > 10.3--9.3 , possibly dilutional. No overt bleeding reported. Follow CBC in a.m. Nausea, vomiting and questionable diarrhea:? Related to mild uremia from acute kidney injury versus acute GE. Low suspicion for C. difficile. Had 2 episodes of mild nonbloody emesis overnight 5/21. had 3 stools 5/21--have discontinued senna but will keep MiraLAX on board. History of CVA 03/28/11-L sided subcotircal: Continue aspirin, statins. No new focal neurological deficits. Chronic debility: Resides at retirement facility because she needs help with her medications.PT evaluation recommending SNF. Type II DM with legal blindness from [facility] [place] Dr. Dwana Melena: Reasonable inpatient control. Continue SSI. Holding long-acting insulin for now. Sugars 150-215 GERD: On famotidine, adjusted to renal dose and PPI. Resolved right hydronephrosis and distal right ureteric stone: By CT scan. Constipation:see above discussiony be complicated by hypercalcemia.? Overflow incontinence.  Would not disimpact unless clinical reason to  DVT prophylaxis: Subcutaneous heparin Code Status: Full Family Communication: None at bedside-called sister to discuss--njon answer on number provided, left VM message Disposition:  PT evaluation--SNF, when stable.   Consultants:  Nephrology   Procedures:  Foley catheter  Antimicrobials:  None     Subjective:  Fair Vomit x 1 with me at bedside Vomit earleir today as well Also had a losse  stool Had 3 sttols overnight as well--note is on Senna and miralax  Objective:  Vitals:   11/11/16 0200 11/11/16 0202 11/11/16 0237 11/11/16 0350  BP: (!) 211/93 (!) 188/90 (!) 151/79 (!) 159/71  Pulse: 85 85 87 91  Resp: 13 13 14 16   Temp:    98.2 F (36.8 C)  TempSrc:    Oral  SpO2: 99% 98% 99% 98%  Weight:      Height:        Examination:  General exam:  Alert pleasant small frame female Can visualize fairly well at 6 feet withR eye bnut nmot on Left No m/r/g abd soft--tender in epigas, Nondistended Does not have any le edema  mental status seems intact    Data Reviewed: I have personally reviewed following labs and imaging studies  CBC:  Recent Labs Lab 11/08/16 2107 11/08/16 2321 11/08/16 2343 11/09/16 0719 11/10/16 0720 11/11/16 0238  WBC 10.7*  --   --  10.4 9.4 10.3  NEUTROABS 8.1*  --   --  8.4*  --   --   HGB 12.0 7.5* 12.9 11.5* 10.3* 9.3*  HCT 33.0* 22.0* 38.0 33.3* 30.5* 28.4*  MCV 83.5  --   --  84.7 85.9 87.1  PLT 335  --   --  363 329 378   Basic Metabolic Panel:  Recent Labs Lab 11/09/16 1817 11/09/16 2355 11/10/16 0514 11/10/16 1118 11/11/16 0238  NA 126* 129* 131* 133* 136  K 4.7 4.2 4.7 3.9 3.6  CL 89* 95* 96* 101 105  CO2 25 22 19* 19* 19*  GLUCOSE 156* 193* 206* 215* 171*  BUN 64* 62* 64* 62* 56*  CREATININE 8.00* 7.80* 7.51* 7.39* 7.09*  CALCIUM 12.0* 11.0* 10.8* 10.4* 9.5  PHOS 6.2* 6.2* 6.0* 5.8* 5.6*   Liver Function Tests:  Recent Labs Lab 11/09/16 0719  11/09/16 1817 11/09/16 2355 11/10/16 0514 11/10/16 1118 11/11/16 0238  AST 18  --   --   --   --   --   --   ALT 15  --   --   --   --   --   --   ALKPHOS 62  --   --   --   --   --   --   BILITOT 0.7  --   --   --   --   --   --   PROT 7.1  --   --   --   --   --   --   ALBUMIN 3.1*  < > 3.2* 2.8* 3.2* 3.0* 2.9*  < > = values in this interval not  displayed. Coagulation Profile:  Recent Labs Lab 11/09/16 0719  INR 1.01   CBG:  Recent Labs Lab 11/09/16 1720 11/09/16 2240 11/10/16 0904 11/10/16 1632 11/10/16 2137  GLUCAP 136* 203* 184* 265* 150*    Recent Results (from the past 240 hour(s))  Culture, blood (routine x 2)     Status: None (Preliminary result)   Collection Time: 11/09/16 12:25 AM  Result Value Ref Range Status   Specimen Description BLOOD BLOOD LEFT HAND  Final   Special Requests IN PEDIATRIC BOTTLE Blood Culture adequate volume  Final   Culture   Final    NO GROWTH < 24 HOURS Performed at [facility], [place] [address], [place], [place] [address]    Report Status PENDING  Incomplete  Culture, blood (routine x 2)     Status: None (Preliminary result)  Collection Time: 11/09/16 12:25 AM  Result Value Ref Range Status   Specimen Description BLOOD LEFT ANTECUBITAL  Final   Special Requests IN PEDIATRIC BOTTLE Blood Culture adequate volume  Final   Culture   Final    NO GROWTH < 24 HOURS Performed at [facility], [place] [address], [place], [place] [address]    Report Status PENDING  Incomplete  Urine culture     Status: Abnormal   Collection Time: 11/09/16  1:10 AM  Result Value Ref Range Status   Specimen Description URINE, RANDOM  Final   Special Requests NONE  Final   Culture (A)  Final    <10,000 COLONIES/mL INSIGNIFICANT GROWTH Performed at [facility] [address], [place], [place] [address]    Report Status 11/10/2016 FINAL  Final  C difficile quick scan w PCR reflex     Status: None   Collection Time: 11/10/16 12:55 AM  Result Value Ref Range Status   C Diff antigen NEGATIVE NEGATIVE Final   C Diff toxin NEGATIVE NEGATIVE Final   C Diff interpretation No C. difficile detected.  Final    Radiology Studies: No results found.  Scheduled Meds: . aspirin  81 mg Oral Daily  . atorvastatin  20 mg Oral q1800  . famotidine  20 mg Oral Daily  . feeding supplement  1  Container Oral TID BM  . furosemide  40 mg Intravenous Q12H  . heparin  5,000 Units Subcutaneous Q8H  . hydrALAZINE  25 mg Oral Q6H  . insulin aspart  0-9 Units Subcutaneous TID WC  . metoprolol tartrate  100 mg Oral BID  . pantoprazole  40 mg Oral Daily  . polyethylene glycol  17 g Oral Daily  . saccharomyces boulardii  250 mg Oral BID  . sodium chloride flush  3 mL Intravenous Q12H  . triamcinolone cream  1 application Topical TID   Continuous Infusions: . sodium chloride 150 mL/hr at 11/11/16 0701     LOS: 2 days     Verneita Griffes, MD [facility] (P) [phone]   If 7PM-7AM, please contact night-coverage [email or web address] Password [facility] 11/11/2016, 7:46 AM

## 2016-11-12 LAB — CBC
HCT: 26.8 % — ABNORMAL LOW (ref 36.0–46.0)
Hemoglobin: 8.8 g/dL — ABNORMAL LOW (ref 12.0–15.0)
MCH: 28.7 pg (ref 26.0–34.0)
MCHC: 32.8 g/dL (ref 30.0–36.0)
MCV: 87.3 fL (ref 78.0–100.0)
Platelets: 287 10*3/uL (ref 150–400)
RBC: 3.07 MIL/uL — ABNORMAL LOW (ref 3.87–5.11)
RDW: 12.9 % (ref 11.5–15.5)
WBC: 9.2 10*3/uL (ref 4.0–10.5)

## 2016-11-12 LAB — RENAL FUNCTION PANEL
Albumin: 2.8 g/dL — ABNORMAL LOW (ref 3.5–5.0)
Anion gap: 11 (ref 5–15)
BUN: 48 mg/dL — ABNORMAL HIGH (ref 6–20)
CO2: 15 mmol/L — ABNORMAL LOW (ref 22–32)
Calcium: 8.4 mg/dL — ABNORMAL LOW (ref 8.9–10.3)
Chloride: 110 mmol/L (ref 101–111)
Creatinine, Ser: 6.33 mg/dL — ABNORMAL HIGH (ref 0.44–1.00)
GFR calc Af Amer: 8 mL/min — ABNORMAL LOW (ref >60)
GFR calc non Af Amer: 7 mL/min — ABNORMAL LOW (ref >60)
Glucose, Bld: 156 mg/dL — ABNORMAL HIGH (ref 65–99)
Phosphorus: 5.8 mg/dL — ABNORMAL HIGH (ref 2.5–4.6)
Potassium: 3 mmol/L — ABNORMAL LOW (ref 3.5–5.1)
Sodium: 136 mmol/L (ref 135–145)

## 2016-11-12 LAB — GLUCOSE, CAPILLARY
Glucose-Capillary: 137 mg/dL — ABNORMAL HIGH (ref 65–99)
Glucose-Capillary: 173 mg/dL — ABNORMAL HIGH (ref 65–99)
Glucose-Capillary: 148 mg/dL — ABNORMAL HIGH (ref 65–99)

## 2016-11-12 MED ORDER — SODIUM BICARBONATE 650 MG PO TABS
650.0000 mg | ORAL_TABLET | Freq: Two times a day (BID) | ORAL | Status: DC
  Administered 2016-11-12 – 2016-11-17 (×11): 650 mg via ORAL
  Filled 2016-11-12 (×11): qty 1

## 2016-11-12 MED ORDER — AMLODIPINE BESYLATE 5 MG PO TABS
5.0000 mg | ORAL_TABLET | Freq: Every day | ORAL | Status: DC
  Administered 2016-11-12 – 2016-11-15 (×4): 5 mg via ORAL
  Filled 2016-11-12 (×4): qty 1

## 2016-11-12 MED ORDER — POTASSIUM CHLORIDE 10 MEQ/100ML IV SOLN
10.0000 meq | INTRAVENOUS | Status: AC
  Administered 2016-11-12 (×3): 10 meq via INTRAVENOUS
  Filled 2016-11-12 (×3): qty 100

## 2016-11-12 NOTE — Progress Notes (Signed)
PROGRESS NOTE   [name] Michael  QAS:[id number]    DOB: [birth year]    DOA: 11/08/2016  PCP: Arnoldo Morale, MD   I have briefly reviewed patients previous medical records in [facility].  Brief Narrative:  4 ? DM 2, HTN,  HLD,  stroke, a resident of a retirement facility,  stage 4 chronic kidney disease with baseline creatinine of 2.2 (Dr. Florene Glen, nephrology),   hospitalized 5/2-5/44 sepsis secondary to CAP, ESBL Escherichia coli UTI, AKI and nephrolithiasis with right hydronephrosis, completed course of amoxicillin and azithromycin, Re-admit 5/20 with 2 days history of abdominal pain, distention, nausea, vomiting, loose stools, decreased urine output.  Admitted for acute on stage IV chronic kidney disease, hypercalcemia, nausea and vomiting. Nephrology consulted.   Assessment & Plan:   Principal Problem:   Hypercalcemia Active Problems:   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 (HCC)   Abdominal pain   AKI (acute kidney injury) ([place])   Hyponatremia   Hypovolemia   Accelerated hypertension   Acute lower UTI   Acute on stage IV chronic kidney disease-on admit bun/Creat was 65/6.2:  developing MEtabolic acidosis Nephrology  appreciated. Baseline creatinine 2.2. Suspect 2/2 hypercalcemia / volume depletion.  uremic on admission-resolving Metabolic acidosis likely compensatory and will hopefully resolve Ongoing aggressive IV fluid hydration, strict intake output (has Foley catheter)-great urine output (-1.1 L on 5/22). Follow BMP ever 24 hr Creatinine gradually improving (8 > 7.8 > 7.51>>6.5.). Management per nephrology.  Hypercalcemia? milk alkali due to Tums for heartburn.   PTH-Was 12 and total calcium was 13.2. doubt myeloma, not anemic.  Total calcium now 8.4 Cut back saline 150--> 75 cc/h 5/23 as becoming plethoric/swollen in fingers Stop lasix 40 bid 5/23  Holding vitamin D and calcium supplements. Given calcitonin till  5/20  Asymptomatic bacteriuria: Was treated empirically with IV cefepime.  Urine culture however shows <10,000 colonies, insignificant growth.  Discontinued cefepime 5/21  Acute toxic metabolic encephalopathy:  Secondary to uremia from acute kidney injury and electrolyte abnormalities, hypercalcemia. No focal deficits.   Essential hypertension: Continue oral metoprolol and hydralazine. When necessary IV hydralazine.  adding amlodipine 1mfg 5/23  Hyperlipidemia:  Statins.  hyponatremia, Na 116 on admit: IV normal saline . Sodium has improved to 136. Dehydration improving.  Anemia: Hemoglobin has dropped from 11.5 > 10.3--9.3 , possibly dilutional. No overt bleeding reported. Follow CBC Hemoccult stool and monitor for dark tarry stool  Nausea, vomiting and questionable diarrhea: Related to mild uremia from acute kidney injury versus acute GE. Low suspicion for C. difficile.  Had 2 episodes of mild nonbloody emesis overnight 5/21. had 3 stools 5/21 have discontinued senna but will keep MiraLAX on board.  History of CVA 03/28/11-L sided subcotircal: Continue aspirin, statins. No new focal neurological deficits.  Chronic debility: Resides at retirement facility because she needs help with her medications.PT evaluation recommending SNF.  Type II DM with legal blindness from [facility] [place] Dr. Dwana Melena: Reasonable inpatient control. Continue SSI. Holding long-acting insulin for now. Sugars 150-215  GERD: On famotidine, adjusted to renal dose and PPI.  Resolved right hydronephrosis and distal right ureteric stone: By CT scan.  Constipation:see above discussiony be complicated by hypercalcemia.? Overflow incontinence.  Would not disimpact unless clinical reason to  DVT prophylaxis: Subcutaneous heparin Code Status: Full Family Communication: called sister 5/23-no answer left VM Disposition:  PT evaluation--SNF, when stable.   Consultants:  Nephrology    Procedures:  Foley catheter  Antimicrobials:  None  Subjective:  Pleasant oriented in nad told some french toast this am Some reflux [not on tums now]  Objective:  Vitals:   11/12/16 0600 11/12/16 0800 11/12/16 1128 11/12/16 1153  BP: 137/65 (!) 172/72 (!) 180/81 (!) 183/83  Pulse: 84 83 79 75  Resp: 15 15  19   Temp:  97.9 F (36.6 C)  97.4 F (36.3 C)  TempSrc:  Oral  Oral  SpO2: 98% 98%  100%  Weight:      Height:        Examination:  General exam:  Alert fixed stare occaisonally pleasant  nad eomki ncat poor dentition cta b R arm swollen abd soft nt nd no rebound No le edema neuro is intact   Data Reviewed: I have personally reviewed following labs and imaging studies  CBC:  Recent Labs Lab 11/08/16 2107  11/08/16 2343 11/09/16 0719 11/10/16 0720 11/11/16 0238 11/12/16 0903  WBC 10.7*  --   --  10.4 9.4 10.3 9.2  NEUTROABS 8.1*  --   --  8.4*  --   --   --   HGB 12.0  < > 12.9 11.5* 10.3* 9.3* 8.8*  HCT 33.0*  < > 38.0 33.3* 30.5* 28.4* 26.8*  MCV 83.5  --   --  84.7 85.9 87.1 87.3  PLT 335  --   --  363 329 309 287  < > = values in this interval not displayed. Basic Metabolic Panel:  Recent Labs Lab 11/09/16 2355 11/10/16 0514 11/10/16 1118 11/11/16 0238 11/12/16 0903  NA 129* 131* 133* 136 136  K 4.2 4.7 3.9 3.6 3.0*  CL 95* 96* 101 105 110  CO2 22 19* 19* 19* 15*  GLUCOSE 193* 206* 215* 171* 156*  BUN 62* 64* 62* 56* 48*  CREATININE 7.80* 7.51* 7.39* 7.09* 6.33*  CALCIUM 11.0* 10.8* 10.4* 9.5 8.4*  PHOS 6.2* 6.0* 5.8* 5.6* 5.8*   Liver Function Tests:  Recent Labs Lab 11/09/16 0719  11/09/16 2355 11/10/16 0514 11/10/16 1118 11/11/16 0238 11/12/16 0903  AST 18  --   --   --   --   --   --   ALT 15  --   --   --   --   --   --   ALKPHOS 62  --   --   --   --   --   --   BILITOT 0.7  --   --   --   --   --   --   PROT 7.1  --   --   --   --   --   --   ALBUMIN 3.1*  < > 2.8* 3.2* 3.0* 2.9* 2.8*  < > = values in  this interval not displayed. Coagulation Profile:  Recent Labs Lab 11/09/16 0719  INR 1.01   CBG:  Recent Labs Lab 11/11/16 0754 11/11/16 1151 11/11/16 1618 11/11/16 2112 11/12/16 0858  GLUCAP 168* 167* 234* 91 137*    Recent Results (from the past 240 hour(s))  Culture, blood (routine x 2)     Status: None (Preliminary result)   Collection Time: 11/09/16 12:25 AM  Result Value Ref Range Status   Specimen Description BLOOD BLOOD LEFT HAND  Final   Special Requests IN PEDIATRIC BOTTLE Blood Culture adequate volume  Final   Culture   Final    NO GROWTH 3 DAYS Performed at [facility], [place] [address] [place], [place] [address]  Report Status PENDING  Incomplete  Culture, blood (routine x 2)     Status: None (Preliminary result)   Collection Time: 11/09/16 12:25 AM  Result Value Ref Range Status   Specimen Description BLOOD LEFT ANTECUBITAL  Final   Special Requests IN PEDIATRIC BOTTLE Blood Culture adequate volume  Final   Culture   Final    NO GROWTH 3 DAYS Performed at [facility], [place] [address] [place], [place] [address]    Report Status PENDING  Incomplete  Urine culture     Status: Abnormal   Collection Time: 11/09/16  1:10 AM  Result Value Ref Range Status   Specimen Description URINE, RANDOM  Final   Special Requests NONE  Final   Culture (A)  Final    <10,000 COLONIES/mL INSIGNIFICANT GROWTH Performed at [facility] [address], [place], [place] [address]    Report Status 11/10/2016 FINAL  Final  C difficile quick scan w PCR reflex     Status: None   Collection Time: 11/10/16 12:55 AM  Result Value Ref Range Status   C Diff antigen NEGATIVE NEGATIVE Final   C Diff toxin NEGATIVE NEGATIVE Final   C Diff interpretation No C. difficile detected.  Final    Radiology Studies: No results found.  Scheduled Meds: . aspirin  81 mg Oral Daily  . atorvastatin  20 mg Oral q1800  . famotidine  20 mg Oral Daily  . feeding  supplement  1 Container Oral TID BM  . furosemide  40 mg Intravenous Q12H  . heparin  5,000 Units Subcutaneous Q8H  . hydrALAZINE  25 mg Oral Q6H  . insulin aspart  0-9 Units Subcutaneous TID WC  . metoprolol tartrate  100 mg Oral BID  . pantoprazole  40 mg Oral Daily  . polyethylene glycol  17 g Oral Daily  . saccharomyces boulardii  250 mg Oral BID  . sodium chloride flush  3 mL Intravenous Q12H  . triamcinolone cream  1 application Topical TID   Continuous Infusions: . sodium chloride 150 mL/hr at 11/12/16 1128     LOS: 3 days     Verneita Griffes, MD [facility] (P) [phone]   If 7PM-7AM, please contact night-coverage [email or web address] Password [facility] 11/12/2016, 12:26 PM

## 2016-11-12 NOTE — Progress Notes (Signed)
[facility] ROUNDING NOTE   Subjective:   Interval History:Interval History: Interval History: 56 y.o.year-old with history of HTN, DM2 , HL, CVA. Lives in retirement facility to get help w taking medications (per pt). Has CKD baseline creat 2.2, f/b [name] at [name] per pt. Presenting with 2 day hx of abd pain and distension and nausea/ vomiting. Questionable diarrhea. Recent admit for ESBL EColi UTI with sepsis. In ED creat was high 6-9 range, CA high 13 , serum HCO3 high at 35.  Appears better day by day  Calcium situation has resolved although renal function still poor   Labs are pending this morning   Objective:  Vital signs in last 24 hours:  Temp:  [97.4 F (36.3 C)-97.9 F (36.6 C)] 97.9 F (36.6 C) (05/23 0308) Pulse Rate:  [81-84] 84 (05/23 0600) Resp:  [13-24] 15 (05/23 0600) BP: (137-190)/(65-114) 137/65 (05/23 0600) SpO2:  [98 %-100 %] 98 % (05/23 0600)  Weight change:  Filed Weights   11/10/16 1146 11/10/16 1151 11/10/16 1534  Weight: 194 lb 0.1 oz (88 kg) 194 lb 0.1 oz (88 kg) 88 lb (39.9 kg)    Intake/Output: I/O last 3 completed shifts: In: 58 [P.O.:120; I.V.:5103] Out: 7939 [Urine:4040]   Intake/Output this shift:  No intake/output data recorded.  CVS- RRR RS- CTA ABD- BS present soft non-distended EXT- no edema   Basic Metabolic Panel:  Recent Labs Lab 11/09/16 1817 11/09/16 2355 11/10/16 0514 11/10/16 1118 11/11/16 0238  NA 126* 129* 131* 133* 136  K 4.7 4.2 4.7 3.9 3.6  CL 89* 95* 96* 101 105  CO2 25 22 19* 19* 19*  GLUCOSE 156* 193* 206* 215* 171*  BUN 64* 62* 64* 62* 56*  CREATININE 8.00* 7.80* 7.51* 7.39* 7.09*  CALCIUM 12.0* 11.0* 10.8* 10.4* 9.5  PHOS 6.2* 6.2* 6.0* 5.8* 5.6*    Liver Function Tests:  Recent Labs Lab 11/09/16 0719  11/09/16 1817 11/09/16 2355 11/10/16 0514 11/10/16 1118 11/11/16 0238  AST 18  --   --   --   --   --   --   ALT 15  --   --   --   --   --   --   ALKPHOS 62  --   --   --    --   --   --   BILITOT 0.7  --   --   --   --   --   --   PROT 7.1  --   --   --   --   --   --   ALBUMIN 3.1*  < > 3.2* 2.8* 3.2* 3.0* 2.9*  < > = values in this interval not displayed. No results for input(s): LIPASE, AMYLASE in the last 168 hours. No results for input(s): AMMONIA in the last 168 hours.  CBC:  Recent Labs Lab 11/08/16 2107 11/08/16 2321 11/08/16 2343 11/09/16 0719 11/10/16 0720 11/11/16 0238  WBC 10.7*  --   --  10.4 9.4 10.3  NEUTROABS 8.1*  --   --  8.4*  --   --   HGB 12.0 7.5* 12.9 11.5* 10.3* 9.3*  HCT 33.0* 22.0* 38.0 33.3* 30.5* 28.4*  MCV 83.5  --   --  84.7 85.9 87.1  PLT 335  --   --  363 329 309    Cardiac Enzymes: No results for input(s): CKTOTAL, CKMB, CKMBINDEX, TROPONINI in the last 168 hours.  BNP: Invalid input(s): POCBNP  CBG:  Recent Labs Lab 11/10/16 2137 11/11/16 0754 11/11/16 1151 11/11/16 1618 11/11/16 2112  GLUCAP 150* 168* 167* 234* 49    Microbiology: Results for orders placed or performed during the Michael encounter of 11/08/16  Culture, blood (routine x 2)     Status: None (Preliminary result)   Collection Time: 11/09/16 12:25 AM  Result Value Ref Range Status   Specimen Description BLOOD BLOOD LEFT HAND  Final   Special Requests IN PEDIATRIC BOTTLE Blood Culture adequate volume  Final   Culture   Final    NO GROWTH 2 DAYS Performed at [facility], [place] [address], [place], [place] [address]    Report Status PENDING  Incomplete  Culture, blood (routine x 2)     Status: None (Preliminary result)   Collection Time: 11/09/16 12:25 AM  Result Value Ref Range Status   Specimen Description BLOOD LEFT ANTECUBITAL  Final   Special Requests IN PEDIATRIC BOTTLE Blood Culture adequate volume  Final   Culture   Final    NO GROWTH 2 DAYS Performed at [facility], [place] [address] [place], [place] [address]    Report Status PENDING  Incomplete  Urine culture     Status: Abnormal   Collection Time:  11/09/16  1:10 AM  Result Value Ref Range Status   Specimen Description URINE, RANDOM  Final   Special Requests NONE  Final   Culture (A)  Final    <10,000 COLONIES/mL INSIGNIFICANT GROWTH Performed at [facility] [address], [place], [place] [address]    Report Status 11/10/2016 FINAL  Final  C difficile quick scan w PCR reflex     Status: None   Collection Time: 11/10/16 12:55 AM  Result Value Ref Range Status   C Diff antigen NEGATIVE NEGATIVE Final   C Diff toxin NEGATIVE NEGATIVE Final   C Diff interpretation No C. difficile detected.  Final    Coagulation Studies: No results for input(s): LABPROT, INR in the last 72 hours.  Urinalysis: No results for input(s): COLORURINE, LABSPEC, PHURINE, GLUCOSEU, HGBUR, BILIRUBINUR, KETONESUR, PROTEINUR, UROBILINOGEN, NITRITE, LEUKOCYTESUR in the last 72 hours.  Invalid input(s): APPERANCEUR    Imaging: No results found.   Medications:   . sodium chloride 150 mL/hr at 11/12/16 0312   . aspirin  81 mg Oral Daily  . atorvastatin  20 mg Oral q1800  . famotidine  20 mg Oral Daily  . feeding supplement  1 Container Oral TID BM  . furosemide  40 mg Intravenous Q12H  . heparin  5,000 Units Subcutaneous Q8H  . hydrALAZINE  25 mg Oral Q6H  . insulin aspart  0-9 Units Subcutaneous TID WC  . metoprolol tartrate  100 mg Oral BID  . pantoprazole  40 mg Oral Daily  . polyethylene glycol  17 g Oral Daily  . saccharomyces boulardii  250 mg Oral BID  . sodium chloride flush  3 mL Intravenous Q12H  . triamcinolone cream  1 application Topical TID   acetaminophen **OR** acetaminophen, bisacodyl, hydrALAZINE, ondansetron **OR** ondansetron (ZOFRAN) IV  Assessment/ Plan:  1. Acute on CKD 4 - due to hyperCa++ recovery is going to be slow 2 CKD 4 - baseline creat 2.2, f/b [name] Continues to slowly improve will follow. Renal function and labs are improving but pending this morning. I suspect this is going to by a slow  recovery to baseline,  but no indictations for dialysis  3 Hypercalcemia - . Improved  4 Hx CVA  5 Chron debility - is in retirement facility because she needs "help with her medications".  6 HTN 7 HL     LOS: 3 [name] W @TODAY @7 :52 AM

## 2016-11-12 NOTE — Progress Notes (Signed)
CSW continuing to follow for pt return to ALF when stable.  Jorge Ny, LCSW Clinical Social Worker [phone]

## 2016-11-13 LAB — GLUCOSE, CAPILLARY
GLUCOSE-CAPILLARY: 130 mg/dL — AB (ref 65–99)
GLUCOSE-CAPILLARY: 239 mg/dL — AB (ref 65–99)
GLUCOSE-CAPILLARY: 272 mg/dL — AB (ref 65–99)
Glucose-Capillary: 338 mg/dL — ABNORMAL HIGH (ref 65–99)

## 2016-11-13 LAB — COMPREHENSIVE METABOLIC PANEL
ALT: 12 U/L — ABNORMAL LOW (ref 14–54)
ANION GAP: 11 (ref 5–15)
AST: 13 U/L — ABNORMAL LOW (ref 15–41)
Albumin: 2.9 g/dL — ABNORMAL LOW (ref 3.5–5.0)
Alkaline Phosphatase: 79 U/L (ref 38–126)
BILIRUBIN TOTAL: 0.5 mg/dL (ref 0.3–1.2)
BUN: 49 mg/dL — ABNORMAL HIGH (ref 6–20)
CHLORIDE: 109 mmol/L (ref 101–111)
CO2: 14 mmol/L — ABNORMAL LOW (ref 22–32)
Calcium: 7.8 mg/dL — ABNORMAL LOW (ref 8.9–10.3)
Creatinine, Ser: 6.31 mg/dL — ABNORMAL HIGH (ref 0.44–1.00)
GFR calc Af Amer: 8 mL/min — ABNORMAL LOW (ref >60)
GFR, EST NON AFRICAN AMERICAN: 7 mL/min — AB (ref >60)
Glucose, Bld: 142 mg/dL — ABNORMAL HIGH (ref 65–99)
POTASSIUM: 3.5 mmol/L (ref 3.5–5.1)
Sodium: 134 mmol/L — ABNORMAL LOW (ref 135–145)
TOTAL PROTEIN: 6.3 g/dL — AB (ref 6.5–8.1)

## 2016-11-13 NOTE — Progress Notes (Signed)
Physical Therapy Treatment Patient Details Name: [name] Michael MRN: [id number] DOB: 1961-06-22 Today's Date: 11/13/2016    History of Present Illness [name] Michael is a 56 y.o. woman with a history of CVA, HTN, Type 2 diabetes, and CKD 4 (baseline creatinine around 2.2) who was admitted from 5/2 - 5/4 for management of sepsis secondary to CAP, AKI, and nephrolithiasis with right hydronephrosis.  Patient admitted with N&V and was found to be hypovolemic hyponatremia and hypercalcemia.    PT Comments    Pt is progressing well with gait and mobility.  She would benefit from a RW for stability at ALF and HHPT can work on progressing her back to a cane once she is stronger and more steady.  Pt continues to have incontinent stool episodes (she had a BM in the bed just before therapy arrival).  PT will continue to follow acutely and she is still appropriate for return to ALF with [facility] therapy f/u.    Follow Up Recommendations  Home health PT;Other (comment) (at ALF)     Equipment Recommendations  Rolling walker with 5" wheels;Other (comment) (youth sized RW as pt is 4'6" tall)    Recommendations for Other Services   NA     Precautions / Restrictions Precautions Precautions: Fall    Mobility  Bed Mobility Overal bed mobility: Modified Independent Bed Mobility: Rolling;Supine to Sit Rolling: Modified independent (Device/Increase time)   Supine to sit: Modified independent (Device/Increase time)     General bed mobility comments: Rolled bil for peri care as pt had an incontinent bowel episode.  Pt using railings for transitions, but not heavily.   Transfers Overall transfer level: Needs assistance Equipment used: Rolling walker (2 wheeled) Transfers: Sit to/from Stand Sit to Stand: Min guard         General transfer comment: Min guard assist for safety  Ambulation/Gait Ambulation/Gait assistance: Min guard Ambulation Distance (Feet): 300 Feet Assistive device: Rolling walker  (2 wheeled) Gait Pattern/deviations: Step-through pattern;Staggering left;Staggering right Gait velocity: decreased Gait velocity interpretation: Below normal speed for age/gender General Gait Details: Pt needed min guard assist for safety and balance with the occasional staggering step that she generally can use RW to help correct. Per pt report she was using a cane only at baseline, however, found herself reaching for things with her free hand.  "I guess I should have been using a RW"          Balance Overall balance assessment: Needs assistance Sitting-balance support: Feet supported;No upper extremity supported Sitting balance-Leahy Scale: Good     Standing balance support: Bilateral upper extremity supported;No upper extremity supported;Single extremity supported Standing balance-Leahy Scale: Fair                              Cognition Arousal/Alertness: Awake/alert Behavior During Therapy: WFL for tasks assessed/performed Overall Cognitive Status: No family/caregiver present to determine baseline cognitive functioning Area of Impairment: Problem solving                             Problem Solving: Slow processing;Requires verbal cues General Comments: Pt just seems generally slow to process.  No family present to report if this is her normal or not.              Pertinent Vitals/Pain Pain Assessment: Faces Faces Pain Scale: Hurts a little bit Pain Location: pt reports her toes hurt Pain  Descriptors / Indicators: Sore Pain Intervention(s): Limited activity within patient's tolerance;Monitored during session;Repositioned           PT Goals (current goals can now be found in the care plan section) Acute Rehab PT Goals Patient Stated Goal: To get stronger Progress towards PT goals: Progressing toward goals    Frequency    Min 3X/week      PT Plan Current plan remains appropriate       AM-PAC PT "6 Clicks" Daily Activity   Outcome Measure  Difficulty turning over in bed (including adjusting bedclothes, sheets and blankets)?: A Little Difficulty moving from lying on back to sitting on the side of the bed? : A Little Difficulty sitting down on and standing up from a chair with arms (e.g., wheelchair, bedside commode, etc,.)?: A Little Help needed moving to and from a bed to chair (including a wheelchair)?: A Little Help needed walking in hospital room?: A Little Help needed climbing 3-5 steps with a railing? : A Little 6 Click Score: 18    End of Session Equipment Utilized During Treatment: Gait belt Activity Tolerance: Patient tolerated treatment well Patient left: in chair;with call bell/phone within reach;with chair alarm set   PT Visit Diagnosis: Unsteadiness on feet (R26.81);Muscle weakness (generalized) (M62.81);Other abnormalities of gait and mobility (R26.89)     Time: 0413-6438 PT Time Calculation (min) (ACUTE ONLY): 31 min  Charges:  $Gait Training: 8-22 mins $Therapeutic Activity: 8-22 mins           Quindon Denker B. [place], [place], DPT [phone]           11/13/2016, 4:06 PM

## 2016-11-13 NOTE — Progress Notes (Signed)
PROGRESS NOTE   [name] Michael  TMH:[id number]    DOB: 1961-06-09    DOA: 11/08/2016  PCP: Arnoldo Morale, MD   I have briefly reviewed patients previous medical records in [facility]  Brief Narrative:  31 ? DM 2, HTN,  HLD,  stroke, a resident of a retirement facility,  stage 4 chronic kidney disease with baseline creatinine of 2.2 (Dr. Florene Glen, nephrology),   hospitalized 5/2-5/44 sepsis secondary to CAP, ESBL Escherichia coli UTI, AKI and nephrolithiasis with right hydronephrosis, completed course of amoxicillin and azithromycin, Re-admit 5/20 with 2 days history of abdominal pain, distention, nausea, vomiting, loose stools, decreased urine output.  Admitted for acute on stage IV chronic kidney disease, hypercalcemia, nausea and vomiting. Nephrology consulted.   Assessment & Plan:   Principal Problem:   Hypercalcemia Active Problems:   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 (HCC)   Abdominal pain   AKI (acute kidney injury) ([place])   Hyponatremia   Hypovolemia   Accelerated hypertension   Acute lower UTI   Acute on stage IV chronic kidney disease-on admit bun/Creat was 65/6.2:  developing Metabolic acidosis Nephrology  appreciated. Baseline creatinine 2.2. Suspect 2/2 hypercalcemia / volume depletion.  uremic on admission-resolving Metabolic acidosis likely compensatory and will hopefully resolve Ongoing aggressive IV fluid hydration, strict intake output (has Foley catheter)-has been tapering UoP L on 5/24). Creatinine gradually improving (8 > 7.8 > 7.51>>6.5>>6.3.).  appreciate per nephrology.  Hypercalcemia? milk alkali due to Tums for heartburn.   PTH-Was 12 and total calcium was 13.2. doubt myeloma, mildy anemic however.  Total calcium now 7.8 Cut back saline 150--> 75 cc/h 5/23 as becoming plethoric/swollen in fingers Stop lasix 40 bid 5/23  Holding vitamin D and calcium supplements. Given calcitonin till 5/20  Asymptomatic  bacteriuria: Was treated empirically with IV cefepime.  Urine culture however shows <10,000 colonies, insignificant growth.  Discontinued cefepime 5/21  Acute toxic metabolic encephalopathy:  Secondary to uremia from acute kidney injury and electrolyte abnormalities, hypercalcemia. No focal deficits.   Essential hypertension: Continue oral metoprolol and hydralazine. When necessary IV hydralazine.  adding amlodipine 5mg  5/23  Hyperlipidemia:  Statins.  hyponatremia, Na 116 on admit: IV normal saline . Sodium has improved to 136. Dehydration improving.  Anemia: Hemoglobin has dropped from 11.5 > 10.3--9.3>>8.8 , possibly dilutional. No overt bleeding reported. Follow CBC Hemoccult stool and monitor for dark tarry stool  Nausea, vomiting and questionable diarrhea: Related to mild uremia from acute kidney injury versus acute GE. Low suspicion for C. difficile.  Had 2 episodes of mild nonbloody emesis overnight 5/21. had 3 stools 5/21 have discontinued senna but will keep MiraLAX on board.  History of CVA 03/28/11-L sided subcotircal: Continue aspirin, statins. No new focal neurological deficits.  Chronic debility: Resides at retirement facility because she needs help with her medications.PT evaluation recommending SNF.  Type II DM with legal blindness from [facility] [address]. Dwana Melena: Reasonable inpatient control. Continue SSI. Holding long-acting insulin for now. Sugars 148-239  GERD: On famotidine, adjusted to renal dose and PPI.  Resolved right hydronephrosis and distal right ureteric stone: By CT scan.  Constipation:see above discussiony be complicated by hypercalcemia.? Had some stool No further loose stools  DVT prophylaxis: Subcutaneous heparin Code Status: Full Family Communication: called sister and updated 5/24 Disposition:  PT evaluation--SNF, when stable.   Consultants:  Nephrology   Procedures:  Foley catheter  Antimicrobials:  None     Subjective:  Awake alert no new issue  Eating drinking in no distress  Objective:  Vitals:   11/12/16 2350 11/13/16 0549 11/13/16 0901 11/13/16 1228  BP: (!) 154/63 137/64 (!) 154/64 (!) 152/59  Pulse: 82 87 86   Resp:  18    Temp:  98.4 F (36.9 C)    TempSrc:  Oral    SpO2:  99%    Weight:      Height:        Examination:  Alert  pleasant  nad eomi ncat poor dentition cta b Arms and fingers less swollen than prior abd soft nt nd no rebound No le edema neuro is intact   Data Reviewed: I have personally reviewed following labs and imaging studies  CBC:  Recent Labs Lab 11/08/16 2107  11/08/16 2343 11/09/16 0719 11/10/16 0720 11/11/16 0238 11/12/16 0903  WBC 10.7*  --   --  10.4 9.4 10.3 9.2  NEUTROABS 8.1*  --   --  8.4*  --   --   --   HGB 12.0  < > 12.9 11.5* 10.3* 9.3* 8.8*  HCT 33.0*  < > 38.0 33.3* 30.5* 28.4* 26.8*  MCV 83.5  --   --  84.7 85.9 87.1 87.3  PLT 335  --   --  363 329 309 287  < > = values in this interval not displayed. Basic Metabolic Panel:  Recent Labs Lab 11/09/16 2355 11/10/16 0514 11/10/16 1118 11/11/16 0238 11/12/16 0903 11/13/16 0505  NA 129* 131* 133* 136 136 134*  K 4.2 4.7 3.9 3.6 3.0* 3.5  CL 95* 96* 101 105 110 109  CO2 22 19* 19* 19* 15* 14*  GLUCOSE 193* 206* 215* 171* 156* 142*  BUN 62* 64* 62* 56* 48* 49*  CREATININE 7.80* 7.51* 7.39* 7.09* 6.33* 6.31*  CALCIUM 11.0* 10.8* 10.4* 9.5 8.4* 7.8*  PHOS 6.2* 6.0* 5.8* 5.6* 5.8*  --    Liver Function Tests:  Recent Labs Lab 11/09/16 0719  11/10/16 0514 11/10/16 1118 11/11/16 0238 11/12/16 0903 11/13/16 0505  AST 18  --   --   --   --   --  13*  ALT 15  --   --   --   --   --  12*  ALKPHOS 62  --   --   --   --   --  79  BILITOT 0.7  --   --   --   --   --  0.5  PROT 7.1  --   --   --   --   --  6.3*  ALBUMIN 3.1*  < > 3.2* 3.0* 2.9* 2.8* 2.9*  < > = values in this interval not displayed. Coagulation Profile:  Recent Labs Lab 11/09/16 0719   INR 1.01   CBG:  Recent Labs Lab 11/12/16 0858 11/12/16 1152 11/12/16 2016 11/13/16 0759 11/13/16 1154  GLUCAP 137* 173* 148* 130* 239*    Recent Results (from the past 240 hour(s))  Culture, blood (routine x 2)     Status: None (Preliminary result)   Collection Time: 11/09/16 12:25 AM  Result Value Ref Range Status   Specimen Description BLOOD BLOOD LEFT HAND  Final   Special Requests IN PEDIATRIC BOTTLE Blood Culture adequate volume  Final   Culture   Final    NO GROWTH 3 DAYS Performed at [facility], [place] [address] [place], [place] [address]    Report Status PENDING  Incomplete  Culture, blood (routine x 2)  Status: None (Preliminary result)   Collection Time: 11/09/16 12:25 AM  Result Value Ref Range Status   Specimen Description BLOOD LEFT ANTECUBITAL  Final   Special Requests IN PEDIATRIC BOTTLE Blood Culture adequate volume  Final   Culture   Final    NO GROWTH 3 DAYS Performed at [facility], [place] [address], [place], [place] [address]    Report Status PENDING  Incomplete  Urine culture     Status: Abnormal   Collection Time: 11/09/16  1:10 AM  Result Value Ref Range Status   Specimen Description URINE, RANDOM  Final   Special Requests NONE  Final   Culture (A)  Final    <10,000 COLONIES/mL INSIGNIFICANT GROWTH Performed at [facility] [address], [place], [place] [address]    Report Status 11/10/2016 FINAL  Final  C difficile quick scan w PCR reflex     Status: None   Collection Time: 11/10/16 12:55 AM  Result Value Ref Range Status   C Diff antigen NEGATIVE NEGATIVE Final   C Diff toxin NEGATIVE NEGATIVE Final   C Diff interpretation No C. difficile detected.  Final    Radiology Studies: No results found.  Scheduled Meds: . amLODipine  5 mg Oral Daily  . aspirin  81 mg Oral Daily  . atorvastatin  20 mg Oral q1800  . famotidine  20 mg Oral Daily  . feeding supplement  1 Container Oral TID BM  . heparin  5,000 Units  Subcutaneous Q8H  . hydrALAZINE  25 mg Oral Q6H  . insulin aspart  0-9 Units Subcutaneous TID WC  . metoprolol tartrate  100 mg Oral BID  . pantoprazole  40 mg Oral Daily  . polyethylene glycol  17 g Oral Daily  . saccharomyces boulardii  250 mg Oral BID  . sodium bicarbonate  650 mg Oral BID  . sodium chloride flush  3 mL Intravenous Q12H  . triamcinolone cream  1 application Topical TID   Continuous Infusions: . sodium chloride 75 mL/hr at 11/13/16 0431     LOS: 4 days     Verneita Griffes, MD [facility] ([facility]) [phone]   If 7PM-7AM, please contact night-coverage [email or web address] Password [facility] 11/13/2016, 2:33 PM

## 2016-11-13 NOTE — Progress Notes (Signed)
[facility] ROUNDING NOTE   Subjective:   Interval History: Interval History:Interval History: Interval History: 56 y.o.year-old with history of HTN, DM2 , HL, CVA. Lives in retirement facility to get help w taking medications (per pt). Has CKD baseline creat 2.2, f/b Dr Florene Glen at [facility] per pt. Presenting with 2 day hx of abd pain and distension and nausea/ vomiting. Questionable diarrhea. Recent admit for ESBL EColi UTI with sepsis. In ED creat was high 6-9 range, CA high 13 , serum HCO3 high at  14  Appears better day by day  Calcium situation has resolved although renal function still poor   Objective:  Vital signs in last 24 hours:  Temp:  [97.4 F (36.3 C)-98.6 F (37 C)] 98.4 F (36.9 C) (05/24 0549) Pulse Rate:  [75-87] 87 (05/24 0549) Resp:  [2-22] 18 (05/24 0549) BP: (137-192)/(63-91) 137/64 (05/24 0549) SpO2:  [98 %-100 %] 99 % (05/24 0549)  Weight change:  Filed Weights   11/10/16 1146 11/10/16 1151 11/10/16 1534  Weight: 194 lb 0.1 oz (88 kg) 194 lb 0.1 oz (88 kg) 88 lb (39.9 kg)    Intake/Output: I/O last 3 completed shifts: In: 4519.5 [P.O.:487; I.V.:4032.5] Out: 6250 [Urine:6250]   Intake/Output this shift:  No intake/output data recorded.  CVS- RRR RS- CTA ABD- BS present soft non-distended EXT- no edema   Basic Metabolic Panel:  Recent Labs Lab 11/09/16 2355 11/10/16 0514 11/10/16 1118 11/11/16 0238 11/12/16 0903 11/13/16 0505  NA 129* 131* 133* 136 136 134*  K 4.2 4.7 3.9 3.6 3.0* 3.5  CL 95* 96* 101 105 110 109  CO2 22 19* 19* 19* 15* 14*  GLUCOSE 193* 206* 215* 171* 156* 142*  BUN 62* 64* 62* 56* 48* 49*  CREATININE 7.80* 7.51* 7.39* 7.09* 6.33* 6.31*  CALCIUM 11.0* 10.8* 10.4* 9.5 8.4* 7.8*  PHOS 6.2* 6.0* 5.8* 5.6* 5.8*  --     Liver Function Tests:  Recent Labs Lab 11/09/16 0719  11/10/16 0514 11/10/16 1118 11/11/16 0238 11/12/16 0903 11/13/16 0505  AST 18  --   --   --   --   --  13*  ALT 15  --   --   --    --   --  12*  ALKPHOS 62  --   --   --   --   --  79  BILITOT 0.7  --   --   --   --   --  0.5  PROT 7.1  --   --   --   --   --  6.3*  ALBUMIN 3.1*  < > 3.2* 3.0* 2.9* 2.8* 2.9*  < > = values in this interval not displayed. No results for input(s): LIPASE, AMYLASE in the last 168 hours. No results for input(s): AMMONIA in the last 168 hours.  CBC:  Recent Labs Lab 11/08/16 2107  11/08/16 2343 11/09/16 0719 11/10/16 0720 11/11/16 0238 11/12/16 0903  WBC 10.7*  --   --  10.4 9.4 10.3 9.2  NEUTROABS 8.1*  --   --  8.4*  --   --   --   HGB 12.0  < > 12.9 11.5* 10.3* 9.3* 8.8*  HCT 33.0*  < > 38.0 33.3* 30.5* 28.4* 26.8*  MCV 83.5  --   --  84.7 85.9 87.1 87.3  PLT 335  --   --  363 329 309 287  < > = values in this interval not displayed.  Cardiac Enzymes: No  results for input(s): CKTOTAL, CKMB, CKMBINDEX, TROPONINI in the last 168 hours.  BNP: Invalid input(s): POCBNP  CBG:  Recent Labs Lab 11/11/16 2112 11/12/16 0858 11/12/16 1152 11/12/16 2016 11/13/16 0759  GLUCAP 91 137* 173* 148* 130*    Microbiology: Results for orders placed or performed during the hospital encounter of 11/08/16  Culture, blood (routine x 2)     Status: None (Preliminary result)   Collection Time: 11/09/16 12:25 AM  Result Value Ref Range Status   Specimen Description BLOOD BLOOD LEFT HAND  Final   Special Requests IN PEDIATRIC BOTTLE Blood Culture adequate volume  Final   Culture   Final    NO GROWTH 3 DAYS Performed at [facility], [place] [address] [place], [place] [address]    Report Status PENDING  Incomplete  Culture, blood (routine x 2)     Status: None (Preliminary result)   Collection Time: 11/09/16 12:25 AM  Result Value Ref Range Status   Specimen Description BLOOD LEFT ANTECUBITAL  Final   Special Requests IN PEDIATRIC BOTTLE Blood Culture adequate volume  Final   Culture   Final    NO GROWTH 3 DAYS Performed at [facility], [place] [address] [place],  [place] [address]    Report Status PENDING  Incomplete  Urine culture     Status: Abnormal   Collection Time: 11/09/16  1:10 AM  Result Value Ref Range Status   Specimen Description URINE, RANDOM  Final   Special Requests NONE  Final   Culture (A)  Final    <10,000 COLONIES/mL INSIGNIFICANT GROWTH Performed at [facility] [address] [place], [place] [address]    Report Status 11/10/2016 FINAL  Final  C difficile quick scan w PCR reflex     Status: None   Collection Time: 11/10/16 12:55 AM  Result Value Ref Range Status   C Diff antigen NEGATIVE NEGATIVE Final   C Diff toxin NEGATIVE NEGATIVE Final   C Diff interpretation No C. difficile detected.  Final    Coagulation Studies: No results for input(s): LABPROT, INR in the last 72 hours.  Urinalysis: No results for input(s): COLORURINE, LABSPEC, PHURINE, GLUCOSEU, HGBUR, BILIRUBINUR, KETONESUR, PROTEINUR, UROBILINOGEN, NITRITE, LEUKOCYTESUR in the last 72 hours.  Invalid input(s): APPERANCEUR    Imaging: No results found.   Medications:   . sodium chloride 75 mL/hr at 11/13/16 0431   . amLODipine  5 mg Oral Daily  . aspirin  81 mg Oral Daily  . atorvastatin  20 mg Oral q1800  . famotidine  20 mg Oral Daily  . feeding supplement  1 Container Oral TID BM  . heparin  5,000 Units Subcutaneous Q8H  . hydrALAZINE  25 mg Oral Q6H  . insulin aspart  0-9 Units Subcutaneous TID WC  . metoprolol tartrate  100 mg Oral BID  . pantoprazole  40 mg Oral Daily  . polyethylene glycol  17 g Oral Daily  . saccharomyces boulardii  250 mg Oral BID  . sodium bicarbonate  650 mg Oral BID  . sodium chloride flush  3 mL Intravenous Q12H  . triamcinolone cream  1 application Topical TID   acetaminophen **OR** acetaminophen, bisacodyl, hydrALAZINE, ondansetron **OR** ondansetron (ZOFRAN) IV  Assessment/ Plan:  1. Acute on CKD 4 - due to hyperCa++ recovery is going to be slow  --- concerned that creatinine has stalled at the 6 mg .dl   This is concerning that recovery may not be complete 2  CKD 4 - baseline creat 2.2, f/b Dr Rubie Maid Continues to slowly improve will follow. Renal function and labs are improving but pending this morning. I suspect this is going to by a slow recovery to baseline, but no indictations for dialysisnow although I am concerned that if the creatinine continues to stall - dialysis may be needed  3 Hypercalcemia - . Improved  4 Hx CVA  5 Chron debility - is in retirement facility because she needs "help with her medications".  6 HTN 7 HL 8. Metabolic acidosis  Continues on bicarbonate   LOS: 4 Colbert Curenton W @TODAY @9 :03 AM

## 2016-11-14 LAB — CULTURE, BLOOD (ROUTINE X 2)
CULTURE: NO GROWTH
Culture: NO GROWTH
SPECIAL REQUESTS: ADEQUATE
Special Requests: ADEQUATE

## 2016-11-14 LAB — BASIC METABOLIC PANEL
Anion gap: 13 (ref 5–15)
BUN: 51 mg/dL — ABNORMAL HIGH (ref 6–20)
CO2: 13 mmol/L — AB (ref 22–32)
Calcium: 7.3 mg/dL — ABNORMAL LOW (ref 8.9–10.3)
Chloride: 109 mmol/L (ref 101–111)
Creatinine, Ser: 6.45 mg/dL — ABNORMAL HIGH (ref 0.44–1.00)
GFR calc non Af Amer: 6 mL/min — ABNORMAL LOW (ref >60)
GFR, EST AFRICAN AMERICAN: 8 mL/min — AB (ref >60)
GLUCOSE: 188 mg/dL — AB (ref 65–99)
POTASSIUM: 3 mmol/L — AB (ref 3.5–5.1)
Sodium: 135 mmol/L (ref 135–145)

## 2016-11-14 LAB — GLUCOSE, CAPILLARY
GLUCOSE-CAPILLARY: 345 mg/dL — AB (ref 65–99)
GLUCOSE-CAPILLARY: 352 mg/dL — AB (ref 65–99)
Glucose-Capillary: 263 mg/dL — ABNORMAL HIGH (ref 65–99)
Glucose-Capillary: 176 mg/dL — ABNORMAL HIGH (ref 65–99)
Glucose-Capillary: 453 mg/dL — ABNORMAL HIGH (ref 65–99)
Glucose-Capillary: 279 mg/dL — ABNORMAL HIGH (ref 65–99)

## 2016-11-14 MED ORDER — SODIUM BICARBONATE 650 MG PO TABS: 650.0000 mg | ORAL_TABLET | Freq: Two times a day (BID) | ORAL | Status: DC

## 2016-11-14 MED ORDER — SODIUM BICARBONATE 8.4 % IV SOLN
INTRAVENOUS | Status: DC
  Administered 2016-11-14: 10:00:00 via INTRAVENOUS
  Filled 2016-11-14 (×2): qty 150

## 2016-11-14 MED ORDER — INSULIN ASPART 100 UNIT/ML ~~LOC~~ SOLN
20.0000 [IU] | Freq: Once | SUBCUTANEOUS | Status: AC
  Administered 2016-11-14: 20 [IU] via SUBCUTANEOUS

## 2016-11-14 MED ORDER — POTASSIUM CHLORIDE 10 MEQ/100ML IV SOLN
10.0000 meq | INTRAVENOUS | Status: AC
  Administered 2016-11-14 (×2): 10 meq via INTRAVENOUS
  Filled 2016-11-14 (×2): qty 100

## 2016-11-14 NOTE — Progress Notes (Signed)
Pt blood glucose has been high. MD [place] notified.

## 2016-11-14 NOTE — Progress Notes (Signed)
Physical Therapy Treatment Patient Details Name: [name] Michael MRN: [id number] DOB: [birth year] Today's Date: 11/14/2016    History of Present Illness [name] Michael is a 56 y.o. woman with a history of CVA, HTN, Type 2 diabetes, and CKD 4 (baseline creatinine around 2.2) who was admitted from 5/2 - 5/4 for management of sepsis secondary to CAP, AKI, and nephrolithiasis with right hydronephrosis.  Patient admitted with N&V and was found to be hypovolemic hyponatremia and hypercalcemia.    PT Comments    Pt progressing steadily.  She still wanted to use RW for stability, but is closer to time to work on  progressing back toward the cane.  Follow Up Recommendations  Home health PT;Other (comment)     Equipment Recommendations  Rolling walker with 5" wheels;Other (comment) (youth RW, due to pt 4'6")    Recommendations for Other Services       Precautions / Restrictions Precautions Precautions: Fall    Mobility  Bed Mobility Overal bed mobility: Modified Independent Bed Mobility: Supine to Sit     Supine to sit: HOB elevated;Modified independent (Device/Increase time)     General bed mobility comments: slower to move  Transfers Overall transfer level: Needs assistance Equipment used: Rolling walker (2 wheeled) Transfers: Sit to/from Stand Sit to Stand: Supervision         General transfer comment: supervision for safety only  Ambulation/Gait Ambulation/Gait assistance: Supervision Ambulation Distance (Feet): 700 Feet Assistive device: Rolling walker (2 wheeled) Gait Pattern/deviations: Step-through pattern Gait velocity: decreased Gait velocity interpretation: at or above normal speed for age/gender General Gait Details: minor staggers negotiating obstacles coming at her in the halls.  Likely due to overly large RW.  Age appropriate speed for home and neighborhood.  Not the speed for out in the community.   Stairs            Wheelchair Mobility     Modified Rankin (Stroke Patients Only)       Balance Overall balance assessment: Needs assistance Sitting-balance support: No upper extremity supported Sitting balance-Leahy Scale: Good Sitting balance - Comments: sat EOB to don socks with LOB to R due to unlevel surface of bed, min A recovery and donned other sock without LOB   Standing balance support: Bilateral upper extremity supported Standing balance-Leahy Scale: Fair Standing balance comment: cleaning hands at sink, no need for support                            Cognition Arousal/Alertness: Awake/alert Behavior During Therapy: WFL for tasks assessed/performed Overall Cognitive Status: Within Functional Limits for tasks assessed (no family present)                                 General Comments: Still slower to process, but participates well      Exercises      General Comments        Pertinent Vitals/Pain Pain Assessment: No/denies pain    Home Living                      Prior Function            PT Goals (current goals can now be found in the care plan section) Acute Rehab PT Goals Patient Stated Goal: To get stronger PT Goal Formulation: With patient Time For Goal Achievement: 11/24/16 Potential to Achieve Goals: Good  Progress towards PT goals: Progressing toward goals    Frequency    Min 3X/week      PT Plan Current plan remains appropriate    Co-evaluation              AM-PAC PT "6 Clicks" Daily Activity  Outcome Measure  Difficulty turning over in bed (including adjusting bedclothes, sheets and blankets)?: A Little Difficulty moving from lying on back to sitting on the side of the bed? : A Little Difficulty sitting down on and standing up from a chair with arms (e.g., wheelchair, bedside commode, etc,.)?: A Little Help needed moving to and from a bed to chair (including a wheelchair)?: A Little Help needed walking in hospital room?: A  Little Help needed climbing 3-5 steps with a railing? : A Little 6 Click Score: 18    End of Session   Activity Tolerance: Patient tolerated treatment well Patient left: in chair;with call bell/phone within reach;with chair alarm set Nurse Communication: Mobility status PT Visit Diagnosis: Unsteadiness on feet (R26.81);Muscle weakness (generalized) (M62.81);Other abnormalities of gait and mobility (R26.89)     Time: 0626-9485 PT Time Calculation (min) (ACUTE ONLY): 29 min  Charges:  $Gait Training: 8-22 mins $Therapeutic Activity: 8-22 mins                    G Codes:       12-03-16  Donnella Sham, PT [phone]  (pager)   [name] 12/03/16, 2:52 PM

## 2016-11-14 NOTE — Progress Notes (Signed)
PROGRESS NOTE   [name] Michael  EZM:[id number]    DOB: [birth year]    DOA: 11/08/2016  PCP: Arnoldo Morale, MD   I have briefly reviewed patients previous medical records in [facility].  Brief Narrative:  47 ? DM 2, HTN,  HLD,  stroke, a resident of a retirement facility,  stage 4 chronic kidney disease with baseline creatinine of 2.2 (Dr. Florene Glen, nephrology),   hospitalized 5/2-5/44 sepsis secondary to CAP, ESBL Escherichia coli UTI, AKI and nephrolithiasis with right hydronephrosis, completed course of amoxicillin and azithromycin, Re-admit 5/20 with 2 days history of abdominal pain, distention, nausea, vomiting, loose stools, decreased urine output.  Admitted for acute on stage IV chronic kidney disease, hypercalcemia, nausea and vomiting. Nephrology consulted.   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 Baseline creatinine 2.2. Suspect 2/2 hypercalcemia / volume depletion.  uremic on admission-resolving Metabolic acidosis likely compensatory and will hopefully resolve iV fluid hydration normal saline, strict intake output (has Foley catheter)-has been tapering UoP L on 5/24). Creatinine gradually improving (8 > 7.8 > 7.51>>6.5>>6.3But does seem to have plateaued  appreciate per nephrology-I had to discontinue IV bicarbonate 5/25 as sugar was going in the 400s and have replaced with bicarbonate 650 twice a day orally  Hypercalcemia? milk alkali due to Tums for heartburn.   PTH-Was 12 and total calcium was 13.2. doubt myeloma, mildy anemic however.  Total calcium now 7.8 Cut back saline 150--> 75 cc/h 5/23 as  becoming plethoric/swollen in fingers Stop lasix 40 bid 5/23  Holding vitamin D and calcium supplements. Given calcitonin till 5/20  Asymptomatic bacteriuria: Was treated empirically with IV cefepime.  Urine culture however shows <10,000 colonies, insignificant growth.  Discontinued cefepime 5/21  Acute toxic metabolic encephalopathy:  Secondary to uremia from acute kidney injury and electrolyte abnormalities, hypercalcemia. No focal deficits.   Essential hypertension: Continue oral metoprolol and hydralazine. When necessary IV hydralazine.  adding amlodipine 5mg  5/23  Hyperlipidemia:  Statins.  hyponatremia, Na 116 on admit: IV normal saline . Sodium has improved to 136. Dehydration improving.  Anemia: Hemoglobin has dropped from 11.5 > 10.3--9.3>>8.8 , possibly dilutional. No overt bleeding reported. Follow CBC Hemoccult stool and monitor for dark tarry stool Get blood count in a.m.  Nausea, vomiting and questionable diarrhea: Related to mild uremia from acute kidney injury versus acute GE. Low suspicion for C. difficile.  Had 2 episodes of mild nonbloody emesis overnight 5/21. had 3 stools 5/21 have discontinued senna but will keep MiraLAX on board. 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 No new focal neurological deficits. Stable at present  Chronic debility: Resides at retirement facility because she needs help with her medications.PT evaluation recommending SNF.  Type II DM with legal blindness from [facility] [place] Dr. Dwana Melena: Blood sugar elevated 5/25 secondary to use of D5 as a carrier for sodium bicarbonate Sugars has for 55 Given 20 units of insulin nursing to recheck in an hour and informed  GERD: On famotidine, adjusted to renal dose and PPI.  Resolved right hydronephrosis and distal right ureteric stone: By CT scan.  Constipation:see above  discussiony be complicated by hypercalcemia.? Had  some stool No further loose stools and has stabilized  DVT prophylaxis: Subcutaneous heparin Code Status: Full Family Communication: called sister and updated 5/24 Disposition:  PT evaluation--SNF, when we can determine renal function either returns or doesn't   Consultants:  Nephrology   Procedures:  Foley catheter  Antimicrobials:  None    Subjective:  no new issues No skin tightness or swelling or any other complaints  Awake alert BP: (!) 148/74 (!) 126/51 (!) 145/69 133/63  Pulse: 88  85   Resp: 17     Temp: 98.3 F (36.8 C)  98 F (36.7 C)   TempSrc:   Oral   SpO2: 99%  100%   Weight:      Height:        Examination:  Alert  pleasant  nad eomi ncat poor dentition cta b Arms and fingers less swollen than prior abd soft nt nd no rebound No le edema neuro is intact   Data Reviewed: I have personally reviewed following labs and imaging studies  CBC:  Recent Labs Lab 11/08/16 2107  11/08/16 2343 11/09/16 0719 11/10/16 0720 11/11/16 0238 11/12/16 0903  WBC 10.7*  --   --  10.4 9.4 10.3 9.2  NEUTROABS 8.1*  --   --  8.4*  --   --   --   HGB 12.0  < > 12.9 11.5* 10.3* 9.3* 8.8*  HCT 33.0*  < > 38.0 33.3* 30.5* 28.4* 26.8*  MCV 83.5  --   --  84.7 85.9 87.1 87.3  PLT 335  --   --  363 329 309 287  < > = values in this interval not displayed. Basic Metabolic Panel:  Recent Labs Lab 11/09/16 2355 11/10/16 0514 11/10/16 1118 11/11/16 0238 11/12/16 0903 11/13/16 0505 11/14/16 0820  NA 129* 131* 133* 136 136 134* 135  K 4.2 4.7 3.9 3.6 3.0* 3.5 3.0*  CL 95* 96* 101 105 110 109 109  CO2 22 19* 19* 19* 15* 14* 13*  GLUCOSE 193* 206* 215* 171* 156* 142* 188*  BUN 62* 64* 62* 56* 48* 49* 51*  CREATININE 7.80* 7.51* 7.39* 7.09* 6.33* 6.31* 6.45*  CALCIUM 11.0* 10.8* 10.4* 9.5 8.4* 7.8* 7.3*  PHOS 6.2* 6.0* 5.8* 5.6* 5.8*  --   --    Liver Function Tests:  Recent Labs Lab 11/09/16 0719  11/10/16 0514 11/10/16 1118 11/11/16 0238  11/12/16 0903 11/13/16 0505  AST 18  --   --   --   --   --  13*  ALT 15  --   --   --   --   --  12*  ALKPHOS 62  --   --   --   --   --  79  BILITOT 0.7  --   --   --   --   --  0.5  PROT 7.1  --   --   --   --   --  6.3*  ALBUMIN 3.1*  < > 3.2* 3.0* 2.9* 2.8* 2.9*  < > = values in this interval not displayed. Coagulation Profile:  Recent Labs Lab 11/09/16 0719  INR 1.01   CBG:  Recent Labs Lab 11/13/16 2221 11/14/16 0426 11/14/16 0841 11/14/16 1215 11/14/16 1640  GLUCAP 338* 263* 176* 345* 453*    Recent Results (from the past 240 hour(s))  Culture, blood (routine x 2)     Status: None   Collection Time: 11/09/16  12:25 AM  Result Value Ref Range Status   Specimen Description BLOOD BLOOD LEFT HAND  Final   Special Requests IN PEDIATRIC BOTTLE Blood Culture adequate volume  Final   Culture   Final    NO GROWTH 5 DAYS Performed at [facility], [place] [address] [place], [place] [address]    Report Status 11/14/2016 FINAL  Final  Culture, blood (routine x 2)     Status: None   Collection Time: 11/09/16 12:25 AM  Result Value Ref Range Status   Specimen Description BLOOD LEFT ANTECUBITAL  Final   Special Requests IN PEDIATRIC BOTTLE Blood Culture adequate volume  Final   Culture   Final    NO GROWTH 5 DAYS Performed at [facility], [place] [address], [place], [place] [address]    Report Status 11/14/2016 FINAL  Final  Urine culture     Status: Abnormal   Collection Time: 11/09/16  1:10 AM  Result Value Ref Range Status   Specimen Description URINE, RANDOM  Final   Special Requests NONE  Final   Culture (A)  Final    <10,000 COLONIES/mL INSIGNIFICANT GROWTH Performed at [facility] [address] [place], [place] [address]    Report Status 11/10/2016 FINAL  Final  C difficile quick scan w PCR reflex     Status: None   Collection Time: 11/10/16 12:55 AM  Result Value Ref Range Status   C Diff antigen NEGATIVE NEGATIVE Final   C Diff toxin  NEGATIVE NEGATIVE Final   C Diff interpretation No C. difficile detected.  Final    Radiology Studies: No results found.  Scheduled Meds: . amLODipine  5 mg Oral Daily  . aspirin  81 mg Oral Daily  . atorvastatin  20 mg Oral q1800  . famotidine  20 mg Oral Daily  . feeding supplement  1 Container Oral TID BM  . heparin  5,000 Units Subcutaneous Q8H  . hydrALAZINE  25 mg Oral Q6H  . insulin aspart  0-9 Units Subcutaneous TID WC  . metoprolol tartrate  100 mg Oral BID  . pantoprazole  40 mg Oral Daily  . polyethylene glycol  17 g Oral Daily  . saccharomyces boulardii  250 mg Oral BID  . sodium bicarbonate  650 mg Oral BID  . sodium chloride flush  3 mL Intravenous Q12H  . triamcinolone cream  1 application Topical TID   Continuous Infusions: . sodium chloride 75 mL/hr at 11/14/16 0132     LOS: 5 days     Verneita Griffes, MD [facility] (P) [phone]   If 7PM-7AM, please contact night-coverage [email or web address] Password [facility] 11/14/2016, 5:39 PM

## 2016-11-14 NOTE — Progress Notes (Signed)
Pt blood glucose reading at 1903 was with meals, not a fasting blood glucose.

## 2016-11-14 NOTE — Progress Notes (Signed)
[facility] ROUNDING NOTE   Subjective:    Interval History: Interval History:Interval History: Interval History: 56 y.o.year-old with history of HTN, DM2 , HL, CVA. Lives in retirement facility to get help w taking medications (per pt). Has CKD baseline creat 2.2, f/b Dr Florene Glen at [facility] per pt. Presenting with 2 day hx of abd pain and distension and nausea/ vomiting. Questionable diarrhea. Recent admit for ESBL EColi UTI with sepsis. In ED creat was high 6-9 range, CA high 13 , serum HCO3 high at  14 Appears better day by day Calcium situation has resolved although renal function still poor   Objective:  Vital signs in last 24 hours:  Temp:  [98.2 F (36.8 C)-98.3 F (36.8 C)] 98.3 F (36.8 C) (05/25 0514) Pulse Rate:  [80-90] 88 (05/25 0514) Resp:  [16-18] 17 (05/25 0514) BP: (128-159)/(45-74) 148/74 (05/25 0514) SpO2:  [98 %-100 %] 99 % (05/25 0514)  Weight change:  Filed Weights   11/10/16 1146 11/10/16 1151 11/10/16 1534  Weight: 194 lb 0.1 oz (88 kg) 194 lb 0.1 oz (88 kg) 88 lb (39.9 kg)    Intake/Output: I/O last 3 completed shifts: In: 4276.3 [P.O.:1080; I.V.:3196.3] Out: 5600 [Urine:5600]   Intake/Output this shift:  No intake/output data recorded.  CVS- RRR RS- CTA ABD- BS present soft non-distended EXT- no edema   Basic Metabolic Panel:  Recent Labs Lab 11/09/16 2355 11/10/16 0514 11/10/16 1118 11/11/16 0238 11/12/16 0903 11/13/16 0505 11/14/16 0820  NA 129* 131* 133* 136 136 134* 135  K 4.2 4.7 3.9 3.6 3.0* 3.5 3.0*  CL 95* 96* 101 105 110 109 109  CO2 22 19* 19* 19* 15* 14* 13*  GLUCOSE 193* 206* 215* 171* 156* 142* 188*  BUN 62* 64* 62* 56* 48* 49* 51*  CREATININE 7.80* 7.51* 7.39* 7.09* 6.33* 6.31* 6.45*  CALCIUM 11.0* 10.8* 10.4* 9.5 8.4* 7.8* 7.3*  PHOS 6.2* 6.0* 5.8* 5.6* 5.8*  --   --     Liver Function Tests:  Recent Labs Lab 11/09/16 0719  11/10/16 0514 11/10/16 1118 11/11/16 0238 11/12/16 0903 11/13/16 0505   AST 18  --   --   --   --   --  13*  ALT 15  --   --   --   --   --  12*  ALKPHOS 62  --   --   --   --   --  79  BILITOT 0.7  --   --   --   --   --  0.5  PROT 7.1  --   --   --   --   --  6.3*  ALBUMIN 3.1*  < > 3.2* 3.0* 2.9* 2.8* 2.9*  < > = values in this interval not displayed. No results for input(s): LIPASE, AMYLASE in the last 168 hours. No results for input(s): AMMONIA in the last 168 hours.  CBC:  Recent Labs Lab 11/08/16 2107  11/08/16 2343 11/09/16 0719 11/10/16 0720 11/11/16 0238 11/12/16 0903  WBC 10.7*  --   --  10.4 9.4 10.3 9.2  NEUTROABS 8.1*  --   --  8.4*  --   --   --   HGB 12.0  < > 12.9 11.5* 10.3* 9.3* 8.8*  HCT 33.0*  < > 38.0 33.3* 30.5* 28.4* 26.8*  MCV 83.5  --   --  84.7 85.9 87.1 87.3  PLT 335  --   --  363 329 309 287  < > =  values in this interval not displayed.  Cardiac Enzymes: No results for input(s): CKTOTAL, CKMB, CKMBINDEX, TROPONINI in the last 168 hours.  BNP: Invalid input(s): POCBNP  CBG:  Recent Labs Lab 11/13/16 1154 11/13/16 1654 11/13/16 2221 11/14/16 0426 11/14/16 0841  GLUCAP 239* 272* 338* 72* 176*    Microbiology: Results for orders placed or performed during the hospital encounter of 11/08/16  Culture, blood (routine x 2)     Status: None (Preliminary result)   Collection Time: 11/09/16 12:25 AM  Result Value Ref Range Status   Specimen Description BLOOD BLOOD LEFT HAND  Final   Special Requests IN PEDIATRIC BOTTLE Blood Culture adequate volume  Final   Culture   Final    NO GROWTH 4 DAYS Performed at [facility], [place] [address], [place], [place] [address]    Report Status PENDING  Incomplete  Culture, blood (routine x 2)     Status: None (Preliminary result)   Collection Time: 11/09/16 12:25 AM  Result Value Ref Range Status   Specimen Description BLOOD LEFT ANTECUBITAL  Final   Special Requests IN PEDIATRIC BOTTLE Blood Culture adequate volume  Final   Culture   Final    NO GROWTH 4  DAYS Performed at [facility], [place] [address], [place], [place] [address]    Report Status PENDING  Incomplete  Urine culture     Status: Abnormal   Collection Time: 11/09/16  1:10 AM  Result Value Ref Range Status   Specimen Description URINE, RANDOM  Final   Special Requests NONE  Final   Culture (A)  Final    <10,000 COLONIES/mL INSIGNIFICANT GROWTH Performed at [facility] [address] [place], [place] [address]    Report Status 11/10/2016 FINAL  Final  C difficile quick scan w PCR reflex     Status: None   Collection Time: 11/10/16 12:55 AM  Result Value Ref Range Status   C Diff antigen NEGATIVE NEGATIVE Final   C Diff toxin NEGATIVE NEGATIVE Final   C Diff interpretation No C. difficile detected.  Final    Coagulation Studies: No results for input(s): LABPROT, INR in the last 72 hours.  Urinalysis: No results for input(s): COLORURINE, LABSPEC, PHURINE, GLUCOSEU, HGBUR, BILIRUBINUR, KETONESUR, PROTEINUR, UROBILINOGEN, NITRITE, LEUKOCYTESUR in the last 72 hours.  Invalid input(s): APPERANCEUR    Imaging: No results found.   Medications:   . sodium chloride 75 mL/hr at 11/14/16 0132   . amLODipine  5 mg Oral Daily  . aspirin  81 mg Oral Daily  . atorvastatin  20 mg Oral q1800  . famotidine  20 mg Oral Daily  . feeding supplement  1 Container Oral TID BM  . heparin  5,000 Units Subcutaneous Q8H  . hydrALAZINE  25 mg Oral Q6H  . insulin aspart  0-9 Units Subcutaneous TID WC  . metoprolol tartrate  100 mg Oral BID  . pantoprazole  40 mg Oral Daily  . polyethylene glycol  17 g Oral Daily  . saccharomyces boulardii  250 mg Oral BID  . sodium bicarbonate  650 mg Oral BID  . sodium chloride flush  3 mL Intravenous Q12H  . triamcinolone cream  1 application Topical TID   acetaminophen **OR** acetaminophen, bisacodyl, hydrALAZINE, ondansetron **OR** ondansetron (ZOFRAN) IV  Assessment/ Plan:  1. Acute on CKD 4 - due to hyperCa++ recovery is going  to be slow  --- concerned that creatinine has stalled at the 6 mg .dl  This is concerning that recovery may not be complete 2 CKD 4 - baseline creat 2.2, f/b Dr Rubie Maid Continues to slowly improve will follow. Renal function and labs are improving but pending this morning. I suspect this is going to by a slow recovery to baseline, but no indictations for dialysisnow although I am concerned that if the creatinine continues to stall - dialysis may be needed  3 Hypercalcemia - . Improved  4 Hx CVA  5 Chron debility - is in retirement facility because she needs "help with her medications".  6 HTN 7 HL 8. Metabolic acidosis     Bicarbonate ---  Start  IV bicarbonate  9. Hypokalemia  Replete    LOS: 5 Eldrige Pitkin W @TODAY @9 :17 AM

## 2016-11-15 LAB — GLUCOSE, CAPILLARY
GLUCOSE-CAPILLARY: 186 mg/dL — AB (ref 65–99)
GLUCOSE-CAPILLARY: 369 mg/dL — AB (ref 65–99)
Glucose-Capillary: 249 mg/dL — ABNORMAL HIGH (ref 65–99)
Glucose-Capillary: 273 mg/dL — ABNORMAL HIGH (ref 65–99)

## 2016-11-15 LAB — RENAL FUNCTION PANEL
ALBUMIN: 2.7 g/dL — AB (ref 3.5–5.0)
Anion gap: 14 (ref 5–15)
BUN: 51 mg/dL — ABNORMAL HIGH (ref 6–20)
CO2: 16 mmol/L — ABNORMAL LOW (ref 22–32)
Calcium: 6.5 mg/dL — ABNORMAL LOW (ref 8.9–10.3)
Chloride: 105 mmol/L (ref 101–111)
Creatinine, Ser: 5.79 mg/dL — ABNORMAL HIGH (ref 0.44–1.00)
GFR, EST AFRICAN AMERICAN: 9 mL/min — AB (ref >60)
GFR, EST NON AFRICAN AMERICAN: 7 mL/min — AB (ref >60)
Glucose, Bld: 169 mg/dL — ABNORMAL HIGH (ref 65–99)
PHOSPHORUS: 4.6 mg/dL (ref 2.5–4.6)
POTASSIUM: 2.7 mmol/L — AB (ref 3.5–5.1)
SODIUM: 135 mmol/L (ref 135–145)

## 2016-11-15 LAB — CBC WITH DIFFERENTIAL/PLATELET
BASOS ABS: 0 10*3/uL (ref 0.0–0.1)
BASOS PCT: 0 %
Eosinophils Absolute: 0.4 10*3/uL (ref 0.0–0.7)
Eosinophils Relative: 3 %
HEMATOCRIT: 21.3 % — AB (ref 36.0–46.0)
HEMOGLOBIN: 7.3 g/dL — AB (ref 12.0–15.0)
Lymphocytes Relative: 16 %
Lymphs Abs: 1.9 10*3/uL (ref 0.7–4.0)
MCH: 29.6 pg (ref 26.0–34.0)
MCHC: 34.3 g/dL (ref 30.0–36.0)
MCV: 86.2 fL (ref 78.0–100.0)
MONO ABS: 0.9 10*3/uL (ref 0.1–1.0)
Monocytes Relative: 7 %
NEUTROS ABS: 9 10*3/uL — AB (ref 1.7–7.7)
NEUTROS PCT: 74 %
Platelets: 258 10*3/uL (ref 150–400)
RBC: 2.47 MIL/uL — AB (ref 3.87–5.11)
RDW: 13.5 % (ref 11.5–15.5)
WBC: 12.1 10*3/uL — AB (ref 4.0–10.5)

## 2016-11-15 LAB — MAGNESIUM: Magnesium: 1.4 mg/dL — ABNORMAL LOW (ref 1.7–2.4)

## 2016-11-15 MED ORDER — AMLODIPINE BESYLATE 10 MG PO TABS
10.0000 mg | ORAL_TABLET | Freq: Every day | ORAL | Status: DC
  Administered 2016-11-16 – 2016-11-21 (×6): 10 mg via ORAL
  Filled 2016-11-15 (×6): qty 1

## 2016-11-15 MED ORDER — POTASSIUM PHOSPHATES 15 MMOLE/5ML IV SOLN
20.0000 meq | Freq: Once | INTRAVENOUS | Status: AC
  Administered 2016-11-15: 20 meq via INTRAVENOUS
  Filled 2016-11-15: qty 4.55

## 2016-11-15 MED ORDER — POTASSIUM CHLORIDE CRYS ER 20 MEQ PO TBCR
40.0000 meq | EXTENDED_RELEASE_TABLET | Freq: Once | ORAL | Status: AC
  Administered 2016-11-15: 40 meq via ORAL
  Filled 2016-11-15: qty 2

## 2016-11-15 MED ORDER — LIDOCAINE 5 % EX PTCH
1.0000 | MEDICATED_PATCH | CUTANEOUS | Status: DC
  Administered 2016-11-15 – 2016-11-20 (×5): 1 via TRANSDERMAL
  Filled 2016-11-15 (×6): qty 1

## 2016-11-15 MED ORDER — MAGNESIUM OXIDE 400 (241.3 MG) MG PO TABS
400.0000 mg | ORAL_TABLET | Freq: Two times a day (BID) | ORAL | Status: DC
  Administered 2016-11-15 – 2016-11-21 (×11): 400 mg via ORAL
  Filled 2016-11-15 (×11): qty 1

## 2016-11-15 MED ORDER — SODIUM CHLORIDE 0.9 % IV SOLN
INTRAVENOUS | Status: DC
  Administered 2016-11-15 – 2016-11-16 (×2): via INTRAVENOUS
  Filled 2016-11-15 (×3): qty 1000

## 2016-11-15 MED ORDER — POTASSIUM CHLORIDE CRYS ER 20 MEQ PO TBCR: 20.0000 meq | EXTENDED_RELEASE_TABLET | ORAL | Status: DC | PRN

## 2016-11-15 MED ORDER — INSULIN GLARGINE 100 UNIT/ML ~~LOC~~ SOLN
10.0000 [IU] | Freq: Every day | SUBCUTANEOUS | Status: DC
  Administered 2016-11-15 – 2016-11-16 (×2): 10 [IU] via SUBCUTANEOUS
  Filled 2016-11-15 (×2): qty 0.1

## 2016-11-15 MED ORDER — MAGNESIUM SULFATE IN D5W 1-5 GM/100ML-% IV SOLN
1.0000 g | Freq: Once | INTRAVENOUS | Status: AC
  Administered 2016-11-15: 1 g via INTRAVENOUS
  Filled 2016-11-15: qty 100

## 2016-11-15 NOTE — Progress Notes (Signed)
No Kenalog cream. Notified pharmacy.

## 2016-11-15 NOTE — Progress Notes (Addendum)
Patient potassium was 2.7 notified the dr T Rennae Ferraiolo twice but has not responded yet.  No answer at the number provided; orders for potassium replacement were placed. Thanks.  -T [name] Michael

## 2016-11-15 NOTE — Progress Notes (Signed)
[facility] ROUNDING NOTE   Subjective:   Interval History:Interval History: Interval History:Interval History: Interval History: 56 y.o.year-old with history of HTN, DM2 , HL, CVA. Lives in retirement facility to get help w taking medications (per pt). Has CKD baseline creat 2.2, f/b Dr Florene Glen at [facility] per pt. Presenting with 2 day hx of abd pain and distension and nausea/ vomiting. Questionable diarrhea. Recent admit for ESBL EColi UTI with sepsis. In ED creat was high 6-9 range, CA high 13 , serum HCO3 high at 14Appears better day by day Calcium situation has resolved although renal function still poor. It seems to be improving slowly   Objective:  Vital signs in last 24 hours:  Temp:  [98 F (36.7 C)-99.4 F (37.4 C)] 99.4 F (37.4 C) (05/26 0511) Pulse Rate:  [85-94] 93 (05/26 0511) Resp:  [18] 18 (05/26 0511) BP: (126-146)/(51-69) 146/69 (05/26 0511) SpO2:  [94 %-100 %] 94 % (05/26 0511)  Weight change:  Filed Weights   11/10/16 1146 11/10/16 1151 11/10/16 1534  Weight: 194 lb 0.1 oz (88 kg) 194 lb 0.1 oz (88 kg) 88 lb (39.9 kg)    Intake/Output: I/O last 3 completed shifts: In: 3980.8 [P.O.:240; I.V.:3740.8] Out: 3850 [Urine:3850]   Intake/Output this shift:  No intake/output data recorded.  CVS- RRR RS- CTA ABD- BS present soft non-distended EXT- no edema   Basic Metabolic Panel:  Recent Labs Lab 11/10/16 0514 11/10/16 1118 11/11/16 0238 11/12/16 0903 11/13/16 0505 11/14/16 0820 11/15/16 0342 11/15/16 0852  NA 131* 133* 136 136 134* 135 135  --   K 4.7 3.9 3.6 3.0* 3.5 3.0* 2.7*  --   CL 96* 101 105 110 109 109 105  --   CO2 19* 19* 19* 15* 14* 13* 16*  --   GLUCOSE 206* 215* 171* 156* 142* 188* 169*  --   BUN 64* 62* 56* 48* 49* 51* 51*  --   CREATININE 7.51* 7.39* 7.09* 6.33* 6.31* 6.45* 5.79*  --   CALCIUM 10.8* 10.4* 9.5 8.4* 7.8* 7.3* 6.5*  --   MG  --   --   --   --   --   --   --  1.4*  PHOS 6.0* 5.8* 5.6* 5.8*  --   --   4.6  --     Liver Function Tests:  Recent Labs Lab 11/09/16 0719  11/10/16 1118 11/11/16 0238 11/12/16 0903 11/13/16 0505 11/15/16 0342  AST 18  --   --   --   --  13*  --   ALT 15  --   --   --   --  12*  --   ALKPHOS 62  --   --   --   --  79  --   BILITOT 0.7  --   --   --   --  0.5  --   PROT 7.1  --   --   --   --  6.3*  --   ALBUMIN 3.1*  < > 3.0* 2.9* 2.8* 2.9* 2.7*  < > = values in this interval not displayed. No results for input(s): LIPASE, AMYLASE in the last 168 hours. No results for input(s): AMMONIA in the last 168 hours.  CBC:  Recent Labs Lab 11/08/16 2107  11/09/16 0719 11/10/16 0720 11/11/16 0238 11/12/16 0903 11/15/16 0342  WBC 10.7*  --  10.4 9.4 10.3 9.2 12.1*  NEUTROABS 8.1*  --  8.4*  --   --   --  9.0*  HGB 12.0  < > 11.5* 10.3* 9.3* 8.8* 7.3*  HCT 33.0*  < > 33.3* 30.5* 28.4* 26.8* 21.3*  MCV 83.5  --  84.7 85.9 87.1 87.3 86.2  PLT 335  --  363 329 309 287 258  < > = values in this interval not displayed.  Cardiac Enzymes: No results for input(s): CKTOTAL, CKMB, CKMBINDEX, TROPONINI in the last 168 hours.  BNP: Invalid input(s): POCBNP  CBG:  Recent Labs Lab 11/14/16 1215 11/14/16 1640 11/14/16 1903 11/14/16 2120 11/15/16 0826  GLUCAP 345* 453* 352* 55* 186*    Microbiology: Results for orders placed or performed during the hospital encounter of 11/08/16  Culture, blood (routine x 2)     Status: None   Collection Time: 11/09/16 12:25 AM  Result Value Ref Range Status   Specimen Description BLOOD BLOOD LEFT HAND  Final   Special Requests IN PEDIATRIC BOTTLE Blood Culture adequate volume  Final   Culture   Final    NO GROWTH 5 DAYS Performed at [facility], [place] [address] [place], [place] [address]    Report Status 11/14/2016 FINAL  Final  Culture, blood (routine x 2)     Status: None   Collection Time: 11/09/16 12:25 AM  Result Value Ref Range Status   Specimen Description BLOOD LEFT ANTECUBITAL  Final    Special Requests IN PEDIATRIC BOTTLE Blood Culture adequate volume  Final   Culture   Final    NO GROWTH 5 DAYS Performed at [facility], [place] [address], [place], [place] [address]    Report Status 11/14/2016 FINAL  Final  Urine culture     Status: Abnormal   Collection Time: 11/09/16  1:10 AM  Result Value Ref Range Status   Specimen Description URINE, RANDOM  Final   Special Requests NONE  Final   Culture (A)  Final    <10,000 COLONIES/mL INSIGNIFICANT GROWTH Performed at [facility] [name], [place], [place] [address]    Report Status 11/10/2016 FINAL  Final  C difficile quick scan w PCR reflex     Status: None   Collection Time: 11/10/16 12:55 AM  Result Value Ref Range Status   C Diff antigen NEGATIVE NEGATIVE Final   C Diff toxin NEGATIVE NEGATIVE Final   C Diff interpretation No C. difficile detected.  Final    Coagulation Studies: No results for input(s): LABPROT, INR in the last 72 hours.  Urinalysis: No results for input(s): COLORURINE, LABSPEC, PHURINE, GLUCOSEU, HGBUR, BILIRUBINUR, KETONESUR, PROTEINUR, UROBILINOGEN, NITRITE, LEUKOCYTESUR in the last 72 hours.  Invalid input(s): APPERANCEUR    Imaging: No results found.   Medications:   . potassium phosphate IVPB (mEq) 20 mEq (11/15/16 0929)  . sodium chloride 0.9 % 1,000 mL with potassium chloride 80 mEq infusion     . amLODipine  5 mg Oral Daily  . aspirin  81 mg Oral Daily  . atorvastatin  20 mg Oral q1800  . famotidine  20 mg Oral Daily  . feeding supplement  1 Container Oral TID BM  . heparin  5,000 Units Subcutaneous Q8H  . hydrALAZINE  25 mg Oral Q6H  . insulin aspart  0-9 Units Subcutaneous TID WC  . insulin glargine  10 Units Subcutaneous Daily  . metoprolol tartrate  100 mg Oral BID  . pantoprazole  40 mg Oral Daily  . polyethylene glycol  17 g Oral Daily  . saccharomyces boulardii  250 mg Oral BID  .  sodium bicarbonate  650 mg Oral BID  . sodium chloride flush  3 mL  Intravenous Q12H  . triamcinolone cream  1 application Topical TID   acetaminophen **OR** acetaminophen, bisacodyl, hydrALAZINE, ondansetron **OR** ondansetron (ZOFRAN) IV, potassium chloride  Assessment/ Plan:  1. Acute on CKD 4 - due to hyperCa++ recovery is going to be slow --- concerned that creatinine has stalled at the 6 mg .dl This is concerning that recovery may not be complete 2 CKD 4 - baseline creat 2.2, f/b Dr Rubie Maid Continues to slowly improve will follow. Renal function and labs are improving but pending this morning. I suspect this is going to by a slow recovery to baseline, but no indictations for dialysisnow although I am concerned that if the creatinine continues to stall - dialysis may be needed  3 Hypercalcemia - . Improved  4 Hx CVA  5 Chron debility - is in retirement facility because she needs "help with her medications".  6 HTN 7 HL 8. Metabolic acidosis     Bicarbonate ---   continues on oral medications  9. Hypokalemia  Replete      LOS: 6 Amritpal Shropshire W @TODAY @11 :16 AM

## 2016-11-15 NOTE — Progress Notes (Signed)
CRITICAL VALUE ALERT  Critical Value:  Potassium 2.7  Date & Time Notied: 11/15/16 at 0550  Provider Notified: T opyd  Orders Received/Actions taken: not yet responded

## 2016-11-15 NOTE — Progress Notes (Signed)
PROGRESS NOTE   [name] Michael  KAJ:[id number]    DOB: 07-16-1960    DOA: 11/08/2016  PCP: Arnoldo Morale, MD   I have briefly reviewed patients previous medical records in [facility].  Brief Narrative:    89 ? DM 2, HTN,  HLD,  stroke, a resident of a retirement facility,  stage 4 chronic kidney disease with baseline creatinine of 2.2 (Dr. Florene Glen, nephrology),   hospitalized 5/2-5/44 sepsis secondary to CAP, ESBL Escherichia coli UTI, AKI and nephrolithiasis with right hydronephrosis, completed course of amoxicillin and azithromycin, Re-admit 5/20 with 2 days history of abdominal pain, distention, nausea, vomiting, loose stools, decreased urine output.  Admitted for acute on stage IV chronic kidney disease, hypercalcemia, nausea and vomiting. Nephrology consulted.   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 Baseline creatinine 2.2. Suspect 2/2 hypercalcemia / volume depletion.  uremic on admission-resolving Metabolic acidosis likely compensatory , replacing with sodium bicarbonate orally 650 twice a day iV fluid hydration normal saline, strict intake output (has Foley catheter)-has been tapering UoP L on 5/24). Creatinine gradually improving (8 > 7.8 > 7.51>>6.5>>6.3-->5.7 )  Moderate to severe hypokalemia Replacing potassium with 80 mEq and 75 cc per hour Also will give 4 runs of potassium IV as well Mag 1.4 replace with Mag ox 400 bid  Hypercalcemia? milk alkali due to Tums for heartburn.   PTH-Was 12 and total calcium was 13.2. doubt myeloma, mildy anemic however.  Total calcium now 7.8 Cut back  saline 150--> 75 cc/h 5/23 as becoming plethoric/swollen in fingers Stop lasix 40 bid 5/23  Holding vitamin D and calcium supplements. Given calcitonin till 5/20  Asymptomatic bacteriuria: Was treated empirically with IV cefepime.  Urine culture however shows <10,000 colonies, insignificant growth.  Discontinued cefepime 5/21  Acute toxic metabolic encephalopathy:  Secondary to uremia from acute kidney injury and electrolyte abnormalities, hypercalcemia. No focal deficits.   Essential hypertension: Continue oral metoprolol and hydralazine. When necessary IV hydralazine.   Adding amlodipine 5mg  5/23--increased to 10 mg qd 5/26  Shoulder pain-new c/o 5/26 At [facility] Given lidocaine patch--if no relief would xray in am  Hyperlipidemia:  Statins.  hyponatremia, Na 116 on admit: IV normal saline . Sodium has improved to 136. Dehydration improving.  Anemia: Hemoglobin has dropped from 11.5 > 10.3--9.3>>8.8--->7.3 , possibly dilutional. No overt bleeding reported. Follow CBC Get Iron studies-if any lower in am would need transfusion  Nausea, vomiting and questionable diarrhea: Related to mild uremia from acute kidney injury versus acute GE. Low suspicion for C. difficile.  Had 2 episodes of mild nonbloody emesis overnight 5/21. had 3 stools 5/21 have discontinued senna but will keep MiraLAX on board. 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  No new focal neurological deficits. Stable at present  Chronic debility: Resides at retirement facility because she needs help with her medications. PT evaluation recommending SNF.  Type II DM with legal blindness from [facility] [place] Dr. Dwana Melena: Blood sugar elevated 5/25 secondary to use of D5 as a carrier for sodium bicarbonate Sugars has 455 Blood sugar now 260-345 range and we'll  add Lantus 10 units today Re-check tredns in am  GERD: On famotidine, adjusted to  renal dose and PPI.  Resolved right hydronephrosis and distal right ureteric stone: By CT scan.  Constipation:see above discussion be complicated by hypercalcemia.? Had some stool No further loose stools and has stabilized  DVT prophylaxis: Subcutaneous heparin Code Status: Full Family Communication: called sister and updated 5/24 Disposition:  PT evaluation--SNF when can determine trend of renal function  Consultants:  Nephrology   Procedures:  Foley catheter  Antimicrobials:  None    Subjective:  no new issues No skin tightness or swelling or any other complaints  Awake alert BP: (!) 148/74 (!) 126/51 (!) 145/69 133/63  Pulse: 88  85   Resp: 17     Temp: 98.3 F (36.8 C)  98 F (36.7 C)   TempSrc:   Oral   SpO2: 99%  100%   Weight:      Height:        Examination:  Alert  pleasant  nad eomi ncat poor dentition cta b Arms and fingers less swollen than prior abd soft nt nd no rebound No le edema neuro is intact   Data Reviewed: I have personally reviewed following labs and imaging studies  CBC:  Recent Labs Lab 11/08/16 2107  11/08/16 2343 11/09/16 0719 11/10/16 0720 11/11/16 0238 11/12/16 0903  WBC 10.7*  --   --  10.4 9.4 10.3 9.2  NEUTROABS 8.1*  --   --  8.4*  --   --   --   HGB 12.0  < > 12.9 11.5* 10.3* 9.3* 8.8*  HCT 33.0*  < > 38.0 33.3* 30.5* 28.4* 26.8*  MCV 83.5  --   --  84.7 85.9 87.1 87.3  PLT 335  --   --  363 329 309 287  < > = values in this interval not displayed. Basic Metabolic Panel:  Recent Labs Lab 11/09/16 2355 11/10/16 0514 11/10/16 1118 11/11/16 0238 11/12/16 0903 11/13/16 0505 11/14/16 0820  NA 129* 131* 133* 136 136 134* 135  K 4.2 4.7 3.9 3.6 3.0* 3.5 3.0*  CL 95* 96* 101 105 110 109 109  CO2 22 19* 19* 19* 15* 14* 13*  GLUCOSE 193* 206* 215* 171* 156* 142* 188*  BUN 62* 64* 62* 56* 48* 49* 51*  CREATININE 7.80* 7.51* 7.39* 7.09* 6.33* 6.31* 6.45*  CALCIUM 11.0* 10.8* 10.4* 9.5 8.4* 7.8* 7.3*    PHOS 6.2* 6.0* 5.8* 5.6* 5.8*  --   --    Liver Function Tests:  Recent Labs Lab 11/09/16 0719  11/10/16 0514 11/10/16 1118 11/11/16 0238 11/12/16 0903 11/13/16 0505  AST 18  --   --   --   --   --  13*  ALT 15  --   --   --   --   --  12*  ALKPHOS 62  --   --   --   --   --  79  BILITOT 0.7  --   --   --   --   --  0.5  PROT 7.1  --   --   --   --   --  6.3*  ALBUMIN 3.1*  < > 3.2* 3.0* 2.9* 2.8* 2.9*  < > = values in this interval not displayed. Coagulation Profile:  Recent Labs Lab 11/09/16 0719  INR 1.01   CBG:  Recent Labs Lab 11/13/16 2221 11/14/16 0426 11/14/16 0841 11/14/16 1215 11/14/16 1640  GLUCAP 338*  263* 176* 345* 453*    Recent Results (from the past 240 hour(s))  Culture, blood (routine x 2)     Status: None   Collection Time: 11/09/16 12:25 AM  Result Value Ref Range Status   Specimen Description BLOOD BLOOD LEFT HAND  Final   Special Requests IN PEDIATRIC BOTTLE Blood Culture adequate volume  Final   Culture   Final    NO GROWTH 5 DAYS Performed at [facility], [place] [address] [place], [place] [address]    Report Status 11/14/2016 FINAL  Final  Culture, blood (routine x 2)     Status: None   Collection Time: 11/09/16 12:25 AM  Result Value Ref Range Status   Specimen Description BLOOD LEFT ANTECUBITAL  Final   Special Requests IN PEDIATRIC BOTTLE Blood Culture adequate volume  Final   Culture   Final    NO GROWTH 5 DAYS Performed at [facility], [place] [address], [place], [place] [address]    Report Status 11/14/2016 FINAL  Final  Urine culture     Status: Abnormal   Collection Time: 11/09/16  1:10 AM  Result Value Ref Range Status   Specimen Description URINE, RANDOM  Final   Special Requests NONE  Final   Culture (A)  Final    <10,000 COLONIES/mL INSIGNIFICANT GROWTH Performed at [facility] [address], [place], [place] [address]    Report Status 11/10/2016 FINAL  Final  C difficile quick scan w PCR  reflex     Status: None   Collection Time: 11/10/16 12:55 AM  Result Value Ref Range Status   C Diff antigen NEGATIVE NEGATIVE Final   C Diff toxin NEGATIVE NEGATIVE Final   C Diff interpretation No C. difficile detected.  Final    Radiology Studies: No results found.  Scheduled Meds: . amLODipine  5 mg Oral Daily  . aspirin  81 mg Oral Daily  . atorvastatin  20 mg Oral q1800  . famotidine  20 mg Oral Daily  . feeding supplement  1 Container Oral TID BM  . heparin  5,000 Units Subcutaneous Q8H  . hydrALAZINE  25 mg Oral Q6H  . insulin aspart  0-9 Units Subcutaneous TID WC  . metoprolol tartrate  100 mg Oral BID  . pantoprazole  40 mg Oral Daily  . polyethylene glycol  17 g Oral Daily  . saccharomyces boulardii  250 mg Oral BID  . sodium bicarbonate  650 mg Oral BID  . sodium chloride flush  3 mL Intravenous Q12H  . triamcinolone cream  1 application Topical TID   Continuous Infusions: . sodium chloride 75 mL/hr at 11/14/16 0132     LOS: 5 days     Verneita Griffes, MD [facility] (P) [phone]   If 7PM-7AM, please contact night-coverage [email or web address] Password [facility] 11/14/2016, 5:39 PM

## 2016-11-15 NOTE — Progress Notes (Signed)
CRITICAL VALUE ALERT  Critical Value:  K 2.7  Date & Time Notied:  11/15/2016 0550  Provider Notified: MD Opyd

## 2016-11-16 ENCOUNTER — Inpatient Hospital Stay (HOSPITAL_COMMUNITY): Payer: Medicaid Other

## 2016-11-16 LAB — IRON AND TIBC
Iron: 18 ug/dL — ABNORMAL LOW (ref 28–170)
SATURATION RATIOS: 8 % — AB (ref 10.4–31.8)
TIBC: 214 ug/dL — ABNORMAL LOW (ref 250–450)
UIBC: 196 ug/dL

## 2016-11-16 LAB — RETICULOCYTES
RBC.: 2.46 MIL/uL — AB (ref 3.87–5.11)
Retic Count, Absolute: 46.7 10*3/uL (ref 19.0–186.0)
Retic Ct Pct: 1.9 % (ref 0.4–3.1)

## 2016-11-16 LAB — CBC
HCT: 21.6 % — ABNORMAL LOW (ref 36.0–46.0)
HEMOGLOBIN: 7.2 g/dL — AB (ref 12.0–15.0)
MCH: 29.3 pg (ref 26.0–34.0)
MCHC: 33.3 g/dL (ref 30.0–36.0)
MCV: 87.8 fL (ref 78.0–100.0)
Platelets: 260 10*3/uL (ref 150–400)
RBC: 2.46 MIL/uL — ABNORMAL LOW (ref 3.87–5.11)
RDW: 13.9 % (ref 11.5–15.5)
WBC: 14 10*3/uL — ABNORMAL HIGH (ref 4.0–10.5)

## 2016-11-16 LAB — GLUCOSE, CAPILLARY
GLUCOSE-CAPILLARY: 494 mg/dL — AB (ref 65–99)
Glucose-Capillary: 269 mg/dL — ABNORMAL HIGH (ref 65–99)
Glucose-Capillary: 435 mg/dL — ABNORMAL HIGH (ref 65–99)
Glucose-Capillary: 357 mg/dL — ABNORMAL HIGH (ref 65–99)

## 2016-11-16 LAB — RENAL FUNCTION PANEL
ALBUMIN: 2.9 g/dL — AB (ref 3.5–5.0)
ANION GAP: 13 (ref 5–15)
BUN: 50 mg/dL — AB (ref 6–20)
CO2: 12 mmol/L — ABNORMAL LOW (ref 22–32)
Calcium: 6.3 mg/dL — CL (ref 8.9–10.3)
Chloride: 112 mmol/L — ABNORMAL HIGH (ref 101–111)
Creatinine, Ser: 5.54 mg/dL — ABNORMAL HIGH (ref 0.44–1.00)
GFR calc Af Amer: 9 mL/min — ABNORMAL LOW (ref >60)
GFR calc non Af Amer: 8 mL/min — ABNORMAL LOW (ref >60)
Glucose, Bld: 268 mg/dL — ABNORMAL HIGH (ref 65–99)
POTASSIUM: 7.2 mmol/L — AB (ref 3.5–5.1)
Phosphorus: 5.1 mg/dL — ABNORMAL HIGH (ref 2.5–4.6)
Sodium: 137 mmol/L (ref 135–145)

## 2016-11-16 LAB — FOLATE: Folate: 34.7 ng/mL (ref >5.9)

## 2016-11-16 LAB — BASIC METABOLIC PANEL
ANION GAP: 11 (ref 5–15)
Anion gap: 11 (ref 5–15)
BUN: 47 mg/dL — ABNORMAL HIGH (ref 6–20)
BUN: 49 mg/dL — ABNORMAL HIGH (ref 6–20)
CHLORIDE: 108 mmol/L (ref 101–111)
CO2: 12 mmol/L — AB (ref 22–32)
CO2: 13 mmol/L — ABNORMAL LOW (ref 22–32)
Calcium: 6.1 mg/dL — CL (ref 8.9–10.3)
Calcium: 6.1 mg/dL — CL (ref 8.9–10.3)
Chloride: 110 mmol/L (ref 101–111)
Creatinine, Ser: 5.44 mg/dL — ABNORMAL HIGH (ref 0.44–1.00)
Creatinine, Ser: 5.46 mg/dL — ABNORMAL HIGH (ref 0.44–1.00)
GFR calc non Af Amer: 8 mL/min — ABNORMAL LOW (ref >60)
GFR, EST AFRICAN AMERICAN: 9 mL/min — AB (ref >60)
GFR, EST AFRICAN AMERICAN: 9 mL/min — AB (ref >60)
GFR, EST NON AFRICAN AMERICAN: 8 mL/min — AB (ref >60)
GLUCOSE: 440 mg/dL — AB (ref 65–99)
Glucose, Bld: 495 mg/dL — ABNORMAL HIGH (ref 65–99)
POTASSIUM: 7.4 mmol/L — AB (ref 3.5–5.1)
POTASSIUM: 6.8 mmol/L — AB (ref 3.5–5.1)
SODIUM: 132 mmol/L — AB (ref 135–145)
Sodium: 133 mmol/L — ABNORMAL LOW (ref 135–145)

## 2016-11-16 LAB — FERRITIN: FERRITIN: 1421 ng/mL — AB (ref 11–307)

## 2016-11-16 LAB — VITAMIN B12: VITAMIN B 12: 399 pg/mL (ref 180–914)

## 2016-11-16 MED ORDER — SODIUM POLYSTYRENE SULFONATE 15 GM/60ML PO SUSP
15.0000 g | Freq: Once | ORAL | Status: AC
  Administered 2016-11-17: 15 g via ORAL
  Filled 2016-11-16 (×2): qty 60

## 2016-11-16 MED ORDER — HYDRALAZINE HCL 25 MG PO TABS: 25.0000 mg | ORAL_TABLET | Freq: Four times a day (QID) | ORAL | Status: DC

## 2016-11-16 MED ORDER — FUROSEMIDE 10 MG/ML IJ SOLN
40.0000 mg | Freq: Once | INTRAMUSCULAR | Status: AC
  Administered 2016-11-16: 40 mg via INTRAVENOUS
  Filled 2016-11-16: qty 4

## 2016-11-16 MED ORDER — HYDRALAZINE HCL 25 MG PO TABS
25.0000 mg | ORAL_TABLET | Freq: Four times a day (QID) | ORAL | Status: DC
Start: 2016-11-16 — End: 2016-11-21
  Administered 2016-11-16 – 2016-11-21 (×18): 25 mg via ORAL
  Filled 2016-11-16 (×16): qty 1

## 2016-11-16 MED ORDER — INSULIN GLARGINE 100 UNIT/ML ~~LOC~~ SOLN
15.0000 [IU] | Freq: Every day | SUBCUTANEOUS | Status: DC
  Administered 2016-11-17: 15 [IU] via SUBCUTANEOUS
  Filled 2016-11-16 (×2): qty 0.15

## 2016-11-16 MED ORDER — CALCIUM CARBONATE ANTACID 500 MG PO CHEW
1.0000 | CHEWABLE_TABLET | Freq: Two times a day (BID) | ORAL | Status: DC
  Administered 2016-11-16 – 2016-11-19 (×6): 200 mg via ORAL
  Filled 2016-11-16 (×6): qty 1

## 2016-11-16 MED ORDER — SODIUM CHLORIDE 0.9 % IV SOLN
510.0000 mg | Freq: Once | INTRAVENOUS | Status: AC
  Administered 2016-11-16: 510 mg via INTRAVENOUS
  Filled 2016-11-16: qty 17

## 2016-11-16 NOTE — Progress Notes (Signed)
[facility] ROUNDING NOTE   Subjective:   Interval History:Interval History: Interval History:Interval History: Interval History: 56 y.o.year-old with history of HTN, DM2 , HL, CVA. Lives in retirement facility to get help w taking medications (per pt). Has CKD baseline creat 2.2, f/b Dr Florene Glen at [facility] per pt. Presenting with 2 day hx of abd pain and distension and nausea/ vomiting. Questionable diarrhea. Recent admit for ESBL EColi UTI with sepsis. In ED creat was high 6-9 range, CA high 13 , serum HCO3 high at 14Appears better day by day Calcium situation has resolved although renal function still poor. It seems to be improving slowly although labs are pending this morning   Objective:  Vital signs in last 24 hours:  Temp:  [98.3 F (36.8 C)-99.6 F (37.6 C)] 98.3 F (36.8 C) (05/27 0719) Pulse Rate:  [79-94] 79 (05/27 0719) Resp:  [16-18] 18 (05/27 0719) BP: (119-123)/(52-64) 122/52 (05/27 0719) SpO2:  [97 %-98 %] 98 % (05/27 0719)  Weight change:  Filed Weights   11/10/16 1146 11/10/16 1151 11/10/16 1534  Weight: 194 lb 0.1 oz (88 kg) 194 lb 0.1 oz (88 kg) 88 lb (39.9 kg)    Intake/Output: I/O last 3 completed shifts: In: 1093 [P.O.:360; I.V.:2090] Out: 5100 [Urine:5100]   Intake/Output this shift:  No intake/output data recorded.  CVS- RRR RS- CTA ABD- BS present soft non-distended EXT- no edema   Basic Metabolic Panel:  Recent Labs Lab 11/10/16 0514 11/10/16 1118 11/11/16 0238 11/12/16 0903 11/13/16 0505 11/14/16 0820 11/15/16 0342 11/15/16 0852  NA 131* 133* 136 136 134* 135 135  --   K 4.7 3.9 3.6 3.0* 3.5 3.0* 2.7*  --   CL 96* 101 105 110 109 109 105  --   CO2 19* 19* 19* 15* 14* 13* 16*  --   GLUCOSE 206* 215* 171* 156* 142* 188* 169*  --   BUN 64* 62* 56* 48* 49* 51* 51*  --   CREATININE 7.51* 7.39* 7.09* 6.33* 6.31* 6.45* 5.79*  --   CALCIUM 10.8* 10.4* 9.5 8.4* 7.8* 7.3* 6.5*  --   MG  --   --   --   --   --   --   --  1.4*   PHOS 6.0* 5.8* 5.6* 5.8*  --   --  4.6  --     Liver Function Tests:  Recent Labs Lab 11/10/16 1118 11/11/16 0238 11/12/16 0903 11/13/16 0505 11/15/16 0342  AST  --   --   --  13*  --   ALT  --   --   --  12*  --   ALKPHOS  --   --   --  79  --   BILITOT  --   --   --  0.5  --   PROT  --   --   --  6.3*  --   ALBUMIN 3.0* 2.9* 2.8* 2.9* 2.7*   No results for input(s): LIPASE, AMYLASE in the last 168 hours. No results for input(s): AMMONIA in the last 168 hours.  CBC:  Recent Labs Lab 11/10/16 0720 11/11/16 0238 11/12/16 0903 11/15/16 0342 11/16/16 0444  WBC 9.4 10.3 9.2 12.1* 14.0*  NEUTROABS  --   --   --  9.0*  --   HGB 10.3* 9.3* 8.8* 7.3* 7.2*  HCT 30.5* 28.4* 26.8* 21.3* 21.6*  MCV 85.9 87.1 87.3 86.2 87.8  PLT 329 309 287 258 260    Cardiac Enzymes: No results  for input(s): CKTOTAL, CKMB, CKMBINDEX, TROPONINI in the last 168 hours.  BNP: Invalid input(s): POCBNP  CBG:  Recent Labs Lab 11/15/16 0826 11/15/16 1231 11/15/16 1717 11/15/16 2230 11/16/16 0825  GLUCAP 186* 369* 249* 273* 13*    Microbiology: Results for orders placed or performed during the hospital encounter of 11/08/16  Culture, blood (routine x 2)     Status: None   Collection Time: 11/09/16 12:25 AM  Result Value Ref Range Status   Specimen Description BLOOD BLOOD LEFT HAND  Final   Special Requests IN PEDIATRIC BOTTLE Blood Culture adequate volume  Final   Culture   Final    NO GROWTH 5 DAYS Performed at [facility], [place] [address], [place], [place] [address]    Report Status 11/14/2016 FINAL  Final  Culture, blood (routine x 2)     Status: None   Collection Time: 11/09/16 12:25 AM  Result Value Ref Range Status   Specimen Description BLOOD LEFT ANTECUBITAL  Final   Special Requests IN PEDIATRIC BOTTLE Blood Culture adequate volume  Final   Culture   Final    NO GROWTH 5 DAYS Performed at [facility], [place] [address], [place], [place] [address]     Report Status 11/14/2016 FINAL  Final  Urine culture     Status: Abnormal   Collection Time: 11/09/16  1:10 AM  Result Value Ref Range Status   Specimen Description URINE, RANDOM  Final   Special Requests NONE  Final   Culture (A)  Final    <10,000 COLONIES/mL INSIGNIFICANT GROWTH Performed at [facility] [address] [place], [place] [address]    Report Status 11/10/2016 FINAL  Final  C difficile quick scan w PCR reflex     Status: None   Collection Time: 11/10/16 12:55 AM  Result Value Ref Range Status   C Diff antigen NEGATIVE NEGATIVE Final   C Diff toxin NEGATIVE NEGATIVE Final   C Diff interpretation No C. difficile detected.  Final    Coagulation Studies: No results for input(s): LABPROT, INR in the last 72 hours.  Urinalysis: No results for input(s): COLORURINE, LABSPEC, PHURINE, GLUCOSEU, HGBUR, BILIRUBINUR, KETONESUR, PROTEINUR, UROBILINOGEN, NITRITE, LEUKOCYTESUR in the last 72 hours.  Invalid input(s): APPERANCEUR    Imaging: No results found.   Medications:   . sodium chloride 0.9 % 1,000 mL with potassium chloride 80 mEq infusion 75 mL/hr at 11/16/16 0600   . amLODipine  10 mg Oral Daily  . aspirin  81 mg Oral Daily  . atorvastatin  20 mg Oral q1800  . famotidine  20 mg Oral Daily  . feeding supplement  1 Container Oral TID BM  . heparin  5,000 Units Subcutaneous Q8H  . hydrALAZINE  25 mg Oral Q6H  . insulin aspart  0-9 Units Subcutaneous TID WC  . insulin glargine  10 Units Subcutaneous Daily  . lidocaine  1 patch Transdermal Q24H  . magnesium oxide  400 mg Oral BID  . metoprolol tartrate  100 mg Oral BID  . pantoprazole  40 mg Oral Daily  . polyethylene glycol  17 g Oral Daily  . saccharomyces boulardii  250 mg Oral BID  . sodium bicarbonate  650 mg Oral BID  . sodium chloride flush  3 mL Intravenous Q12H  . triamcinolone cream  1 application Topical TID   acetaminophen **OR** acetaminophen, bisacodyl, hydrALAZINE, ondansetron **OR**  ondansetron (ZOFRAN) IV, potassium chloride  Assessment/ Plan:  1. Acute on  CKD 4 - due to hyperCa++ recovery is going to be slow ---creatinine appears to be a little better This is concerning that recovery may not be complete 2 CKD 4 - baseline creat 2.2, f/b Dr Rubie Maid Continues to slowly improve will follow. Renal function and labs are improving but pending this morning. I suspect this is going to by a slow recovery to baseline, but no indictations for dialysisnow although I am concerned that if the creatinine stalls  - dialysis may be needed  3 Hypercalcemia - . Improved  4 Hx CVA  5 Chron debility - is in retirement facility because she needs "help with her medications".  6 HTN 7 HL 8. Metabolic acidosis Bicarbonate ---  continues on oral medications  9. Hypokalemia Replete    LOS: 7 Criston Chancellor W @TODAY @9 :14 AM

## 2016-11-16 NOTE — Progress Notes (Signed)
Notified pharmacy for fluids.

## 2016-11-16 NOTE — Progress Notes (Signed)
PROGRESS NOTE   [name] Michael  CBJ:[id number]    DOB: [birth year]    DOA: 11/08/2016  PCP: Arnoldo Morale, MD   I have briefly reviewed patients previous medical records in [facility].  Brief Narrative:    36 ? DM 2, HTN,  HLD,  stroke, a resident of a retirement facility,  stage 4 chronic kidney disease with baseline creatinine of 2.2 (Dr. Florene Glen, nephrology),   hospitalized 5/2-5/44 sepsis secondary to CAP, ESBL Escherichia coli UTI, AKI and nephrolithiasis with right hydronephrosis, completed course of amoxicillin and azithromycin, Re-admit 5/20 with 2 days history of abdominal pain, distention, nausea, vomiting, loose stools, decreased urine output.  Admitted for acute on stage IV chronic kidney disease, hypercalcemia, nausea and vomiting. Nephrology consulted.   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 Baseline creatinine 2.2. Suspect 2/2 hypercalcemia / volume depletion.  uremic on admission-resolving Metabolic acidosis likely compensatory , replacing with sodium bicarbonate orally 650 twice a day iV fluid hydration normal saline, strict intake output (has Foley catheter)-has been tapering UoP L on 5/24). Creatinine gradually improving (8 > 7.8 > 7.51>>6.5>>6.3-->5.7 )  Moderate to severe hypokalemia Replacing potassium with 80 mEq and 75 cc per hour--She has developed iatrogenic hyperkalemia at 7.2 and I have stopped IV replacement and runs of potassium Mag 1.4 on 5/26 and replaced so check again on 5/28 with labs Recheck potassium this afternoon after giving Lasix 40 and holding potassium No ST-T  wave peaking on telemetry  Hypercalcemia? milk alkali due to Tums for heartburn.   PTH-Was 12 and total calcium was 13.2. doubt myeloma, mildy anemic however.  Total calcium now 7.8 Cut back saline 150--> 75 cc/h 5/23 as becoming plethoric/swollen in fingers Stop lasix 40 bid 5/23  Resumed cautiously calcium on 5/27 twice a day Given calcitonin till 5/20   Asymptomatic bacteriuria: Was treated empirically with IV cefepime.  Urine culture however shows <10,000 colonies, insignificant growth.  Discontinued cefepime 5/21  Acute toxic metabolic encephalopathy:  Secondary to uremia from acute kidney injury and electrolyte abnormalities, hypercalcemia. No focal deficits.   Essential hypertension: Continue oral metoprolol and hydralazine. When necessary IV hydralazine.   Adding amlodipine 5mg  5/23--increased to 10 mg qd 5/26 Better controlled currently  Shoulder pain-new c/o 5/26 At [facility] Given lidocaine patch--we'll get shoulder x-ray 5/27, needed Tylenol for some pain Considering orthopedic input if no better  Hyperlipidemia:  Statins.  hyponatremia, Na 116 on admit: IV normal saline . Sodium has improved to 136. Dehydration improving.  Anemia: Hemoglobin has dropped from 11.5 > 10.3--9.3>>8.8--->7.3 , possibly dilutional. No overt bleeding reported. Follow CBC Giving Feraheme 5/27 given iron saturation ratios are low  Nausea, vomiting and questionable diarrhea: Related to mild uremia from acute kidney injury versus acute GE. Low suspicion for C. difficile.  Had 2 episodes of mild nonbloody emesis overnight 5/21. had 3 stools 5/21 have discontinued senna but will keep MiraLAX on board. 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  No new focal neurological deficits. Stable at present  Chronic debility: Resides at retirement facility because she needs help with her medications.  PT evaluation recommending SNF.  Type II  DM with legal blindness from [facility] [place] Dr. Dwana Melena: Blood sugar elevated 5/25 secondary to use of D5 as a carrier for sodium bicarbonate Sugars has 455 Blood sugar ranging between 269 and 316 range-increase from Lantus 10 units--->515Re-check Trends  GERD: On famotidine, adjusted to renal dose and PPI.  Resolved right hydronephrosis and distal right ureteric stone: By CT scan.  Constipation:see above discussion be complicated by hypercalcemia.? Had some stool No further loose stools and has stabilized  DVT prophylaxis: Subcutaneous heparin Code Status: Full Family Communication: called sister and updated 5/24 Disposition:  PT evaluation--SNF when can determine trend of renal function.  Patient is not ready currently for discharge given electrolyte abnormalities uncontrolled sugar and shoulder  Consultants:  Nephrology   Procedures:  Foley catheter  Antimicrobials:  None    Subjective:  Significant pain in left shoulder is increased however range of motion above 40 with shoulder abduction is painful She got some relief with Tylenol and is not interested in range of motion exercises  Would like to see surgeon for possible injection    Awake alert BP: (!) 148/74 (!) 126/51 (!) 145/69 133/63  Pulse: 88  85   Resp: 17     Temp: 98.3 F (36.8 C)  98 F (36.7 C)   TempSrc:   Oral   SpO2: 99%  100%   Weight:      Height:        Examination:  Alert  pleasant  nad eomi ncat poor dentition cta b Arms and fingers less swollen  Shoulder abduction limited patient able to do empty can test , liftoff with internal rotation is slightly limited  abd soft nt nd no rebound No le edema neuro is intact   Data Reviewed: I have personally reviewed following labs and imaging studies  CBC:  Recent Labs Lab 11/08/16 2107  11/08/16 2343 11/09/16 0719 11/10/16 0720 11/11/16 0238 11/12/16 0903  WBC 10.7*  --   --  10.4 9.4 10.3 9.2  NEUTROABS 8.1*  --    --  8.4*  --   --   --   HGB 12.0  < > 12.9 11.5* 10.3* 9.3* 8.8*  HCT 33.0*  < > 38.0 33.3* 30.5* 28.4* 26.8*  MCV 83.5  --   --  84.7 85.9 87.1 87.3  PLT 335  --   --  363 329 309 287  < > = values in this interval not displayed. Basic Metabolic Panel:  Recent Labs Lab 11/09/16 2355 11/10/16 0514 11/10/16 1118 11/11/16 0238 11/12/16 0903 11/13/16 0505 11/14/16 0820  NA 129* 131* 133* 136 136 134* 135  K 4.2 4.7 3.9 3.6 3.0* 3.5 3.0*  CL 95* 96* 101 105 110 109 109  CO2 22 19* 19* 19* 15* 14* 13*  GLUCOSE 193* 206* 215* 171* 156* 142* 188*  BUN 62* 64* 62* 56* 48* 49* 51*  CREATININE 7.80* 7.51* 7.39* 7.09* 6.33* 6.31* 6.45*  CALCIUM 11.0* 10.8* 10.4* 9.5 8.4* 7.8* 7.3*  PHOS 6.2* 6.0* 5.8* 5.6* 5.8*  --   --    Liver Function Tests:  Recent Labs Lab 11/09/16 0719  11/10/16 0514 11/10/16 1118 11/11/16 0238 11/12/16 0903 11/13/16 0505  AST 18  --   --   --   --   --  13*  ALT 15  --   --   --   --   --  12*  ALKPHOS 62  --   --   --   --   --  79  BILITOT 0.7  --   --   --   --   --  0.5  PROT 7.1  --   --   --   --   --  6.3*  ALBUMIN 3.1*  < > 3.2* 3.0* 2.9* 2.8* 2.9*  < > = values in this interval not displayed. Coagulation Profile:  Recent Labs Lab 11/09/16 0719  INR 1.01   CBG:  Recent Labs Lab 11/13/16 2221 11/14/16 0426 11/14/16 0841 11/14/16 1215 11/14/16 1640  GLUCAP 338* 263* 176* 345* 453*    Recent Results (from the past 240 hour(s))  Culture, blood (routine x 2)     Status: None   Collection Time: 11/09/16 12:25 AM  Result Value Ref Range Status   Specimen Description BLOOD BLOOD LEFT HAND  Final   Special Requests IN PEDIATRIC BOTTLE Blood Culture adequate volume  Final   Culture   Final    NO GROWTH 5 DAYS Performed at [facility], [place] [address] [place], [place] [address]    Report Status 11/14/2016 FINAL  Final  Culture, blood (routine x 2)     Status: None   Collection Time: 11/09/16 12:25 AM  Result Value Ref Range  Status   Specimen Description BLOOD LEFT ANTECUBITAL  Final   Special Requests IN PEDIATRIC BOTTLE Blood Culture adequate volume  Final   Culture   Final    NO GROWTH 5 DAYS Performed at [facility], [place] [address] [place], [place] [address]    Report Status 11/14/2016 FINAL  Final  Urine culture     Status: Abnormal   Collection Time: 11/09/16  1:10 AM  Result Value Ref Range Status   Specimen Description URINE, RANDOM  Final   Special Requests NONE  Final   Culture (A)  Final    <10,000 COLONIES/mL INSIGNIFICANT GROWTH Performed at [facility] [address], [place],  [address]    Report Status 11/10/2016 FINAL  Final  C difficile quick scan w PCR reflex     Status: None   Collection Time: 11/10/16 12:55 AM  Result Value Ref Range Status   C Diff antigen NEGATIVE NEGATIVE Final   C Diff toxin NEGATIVE NEGATIVE Final   C Diff interpretation No C. difficile detected.  Final    Radiology Studies: No results found.  Scheduled Meds: . amLODipine  5 mg Oral Daily  . aspirin  81 mg Oral Daily  . atorvastatin  20 mg Oral q1800  . famotidine  20 mg Oral Daily  . feeding supplement  1 Container Oral TID BM  . heparin  5,000 Units Subcutaneous Q8H  . hydrALAZINE  25 mg Oral Q6H  . insulin aspart  0-9 Units Subcutaneous TID WC  . metoprolol tartrate  100 mg Oral BID  . pantoprazole  40 mg Oral Daily  . polyethylene glycol  17 g Oral Daily  . saccharomyces boulardii  250 mg Oral BID  . sodium bicarbonate  650 mg Oral BID  . sodium chloride flush  3 mL Intravenous Q12H  . triamcinolone cream  1 application Topical TID   Continuous Infusions: . sodium chloride 75 mL/hr at 11/14/16 0132     LOS: 5 days     Verneita Griffes, MD [facility] (P) [phone]   If 7PM-7AM, please contact night-coverage [email or web address] Password [facility] 11/14/2016, 5:39 PM

## 2016-11-16 NOTE — Progress Notes (Signed)
CRITICAL VALUE ALERT  Critical Value:  K+ 7.2 Calcium 6.3  Date & Time Notied:  370 11/16/16  Provider Notified: Verlon Au  Orders Received/Actions taken: Stopped IVF and new orders received.

## 2016-11-17 LAB — BASIC METABOLIC PANEL
ANION GAP: 18 — AB (ref 5–15)
Anion gap: 15 (ref 5–15)
Anion gap: 19 — ABNORMAL HIGH (ref 5–15)
BUN: 56 mg/dL — ABNORMAL HIGH (ref 6–20)
BUN: 49 mg/dL — AB (ref 6–20)
BUN: 48 mg/dL — ABNORMAL HIGH (ref 6–20)
CALCIUM: 6.2 mg/dL — AB (ref 8.9–10.3)
CALCIUM: 6.2 mg/dL — AB (ref 8.9–10.3)
CHLORIDE: 106 mmol/L (ref 101–111)
CO2: 11 mmol/L — ABNORMAL LOW (ref 22–32)
CO2: 18 mmol/L — ABNORMAL LOW (ref 22–32)
CO2: 21 mmol/L — ABNORMAL LOW (ref 22–32)
CREATININE: 5.3 mg/dL — AB (ref 0.44–1.00)
Calcium: 6.5 mg/dL — ABNORMAL LOW (ref 8.9–10.3)
Chloride: 107 mmol/L (ref 101–111)
Chloride: 103 mmol/L (ref 101–111)
Creatinine, Ser: 5.63 mg/dL — ABNORMAL HIGH (ref 0.44–1.00)
Creatinine, Ser: 5.44 mg/dL — ABNORMAL HIGH (ref 0.44–1.00)
GFR calc Af Amer: 9 mL/min — ABNORMAL LOW (ref >60)
GFR calc Af Amer: 9 mL/min — ABNORMAL LOW (ref >60)
GFR calc non Af Amer: 8 mL/min — ABNORMAL LOW (ref >60)
GFR calc non Af Amer: 8 mL/min — ABNORMAL LOW (ref >60)
GFR, EST AFRICAN AMERICAN: 10 mL/min — AB (ref >60)
GFR, EST NON AFRICAN AMERICAN: 8 mL/min — AB (ref >60)
GLUCOSE: 212 mg/dL — AB (ref 65–99)
Glucose, Bld: 321 mg/dL — ABNORMAL HIGH (ref 65–99)
Glucose, Bld: 132 mg/dL — ABNORMAL HIGH (ref 65–99)
POTASSIUM: 3.9 mmol/L (ref 3.5–5.1)
Potassium: 7 mmol/L (ref 3.5–5.1)
Potassium: 3.8 mmol/L (ref 3.5–5.1)
SODIUM: 133 mmol/L — AB (ref 135–145)
SODIUM: 142 mmol/L (ref 135–145)
Sodium: 143 mmol/L (ref 135–145)

## 2016-11-17 LAB — GLUCOSE, CAPILLARY
GLUCOSE-CAPILLARY: 108 mg/dL — AB (ref 65–99)
GLUCOSE-CAPILLARY: 114 mg/dL — AB (ref 65–99)
GLUCOSE-CAPILLARY: 197 mg/dL — AB (ref 65–99)
Glucose-Capillary: 107 mg/dL — ABNORMAL HIGH (ref 65–99)
Glucose-Capillary: 124 mg/dL — ABNORMAL HIGH (ref 65–99)
Glucose-Capillary: 130 mg/dL — ABNORMAL HIGH (ref 65–99)
Glucose-Capillary: 165 mg/dL — ABNORMAL HIGH (ref 65–99)
Glucose-Capillary: 177 mg/dL — ABNORMAL HIGH (ref 65–99)
Glucose-Capillary: 186 mg/dL — ABNORMAL HIGH (ref 65–99)
Glucose-Capillary: 210 mg/dL — ABNORMAL HIGH (ref 65–99)
Glucose-Capillary: 255 mg/dL — ABNORMAL HIGH (ref 65–99)
Glucose-Capillary: 255 mg/dL — ABNORMAL HIGH (ref 65–99)
Glucose-Capillary: 258 mg/dL — ABNORMAL HIGH (ref 65–99)
Glucose-Capillary: 286 mg/dL — ABNORMAL HIGH (ref 65–99)

## 2016-11-17 LAB — MRSA PCR SCREENING: MRSA by PCR: POSITIVE — AB

## 2016-11-17 MED ORDER — SODIUM POLYSTYRENE SULFONATE 15 GM/60ML PO SUSP
15.0000 g | Freq: Once | ORAL | Status: AC
  Administered 2016-11-17: 15 g via ORAL
  Filled 2016-11-17: qty 60

## 2016-11-17 MED ORDER — DEXTROSE 50 % IV SOLN
25.0000 mL | Freq: Once | INTRAVENOUS | Status: AC
  Administered 2016-11-17: 25 mL via INTRAVENOUS
  Filled 2016-11-17: qty 50

## 2016-11-17 MED ORDER — SODIUM CHLORIDE 0.9 % IV SOLN
INTRAVENOUS | Status: DC
  Administered 2016-11-17: 2 [IU]/h via INTRAVENOUS
  Administered 2016-11-18: 0.5 [IU]/h via INTRAVENOUS
  Administered 2016-11-18: 17:00:00 via INTRAVENOUS
  Administered 2016-11-19: 6 [IU]/h via INTRAVENOUS
  Administered 2016-11-19: 4.7 [IU]/h via INTRAVENOUS
  Filled 2016-11-17 (×2): qty 1

## 2016-11-17 MED ORDER — STERILE WATER FOR INJECTION IV SOLN
INTRAVENOUS | Status: DC
  Administered 2016-11-17 – 2016-11-18 (×3): via INTRAVENOUS
  Filled 2016-11-17 (×5): qty 850

## 2016-11-17 MED ORDER — SODIUM POLYSTYRENE SULFONATE 15 GM/60ML PO SUSP
30.0000 g | Freq: Once | ORAL | Status: AC
  Administered 2016-11-17: 30 g via ORAL
  Filled 2016-11-17 (×2): qty 120

## 2016-11-17 MED ORDER — CALCIUM GLUCONATE 10 % IV SOLN: 1.0000 g | Freq: Once | INTRAVENOUS | Status: DC

## 2016-11-17 MED ORDER — SODIUM BICARBONATE 8.4 % IV SOLN
50.0000 meq | Freq: Once | INTRAVENOUS | Status: AC
  Administered 2016-11-17: 50 meq via INTRAVENOUS
  Filled 2016-11-17: qty 50

## 2016-11-17 MED ORDER — FUROSEMIDE 10 MG/ML IJ SOLN
40.0000 mg | Freq: Once | INTRAMUSCULAR | Status: AC
  Administered 2016-11-17: 40 mg via INTRAVENOUS
  Filled 2016-11-17: qty 4

## 2016-11-17 MED ORDER — SODIUM CHLORIDE 0.9 % IV SOLN: INTRAVENOUS | Status: DC

## 2016-11-17 MED ORDER — SODIUM POLYSTYRENE SULFONATE 15 GM/60ML PO SUSP
30.0000 g | Freq: Once | ORAL | Status: AC
  Administered 2016-11-17: 30 g via ORAL
  Filled 2016-11-17: qty 120

## 2016-11-17 MED ORDER — CALCIUM GLUCONATE 10 % IV SOLN
1.0000 g | Freq: Once | INTRAVENOUS | Status: AC
  Administered 2016-11-17: 1 g via INTRAVENOUS
  Filled 2016-11-17: qty 10

## 2016-11-17 MED ORDER — SODIUM CHLORIDE 0.9 % IV SOLN
INTRAVENOUS | Status: AC
  Administered 2016-11-17: 999 mL/h via INTRAVENOUS

## 2016-11-17 MED ORDER — INSULIN ASPART 100 UNIT/ML ~~LOC~~ SOLN
5.0000 [IU] | Freq: Once | SUBCUTANEOUS | Status: AC
  Administered 2016-11-17: 5 [IU] via SUBCUTANEOUS

## 2016-11-17 MED ORDER — DEXTROSE-NACL 5-0.45 % IV SOLN
INTRAVENOUS | Status: DC
  Administered 2016-11-17: 15:00:00 via INTRAVENOUS

## 2016-11-17 NOTE — Progress Notes (Signed)
When RN went to give new IV meds ordered d/t K+ being 7.0 and Ca 6.2, IV was occluded and would no longer flush. RN attempted to reposition IV and still won't flush. IV removed and RN attempted to place another peripheral but was unsuccessful. Ordered for IV team to place IV. Oncoming RN Ellard Artis made aware.

## 2016-11-17 NOTE — Progress Notes (Signed)
Report given to [facility] on [facility].

## 2016-11-17 NOTE — Progress Notes (Signed)
CRITICAL VALUE ALERT  Critical Value:  K+ 7.0 and Ca 6.2  Date & Time Notied:  11/17/16  Provider Notified: Dr. Olevia Bowens  Orders Received/Actions taken: Orders received

## 2016-11-17 NOTE — Progress Notes (Signed)
Physical Therapy Treatment Patient Details Name: [name] Michael MRN: [id number] DOB: [birth year] Today's Date: 11/17/2016    History of Present Illness [name] Michael is a 56 y.o. woman with a history of CVA, HTN, Type 2 diabetes, and CKD 4 (baseline creatinine around 2.2) who was admitted from 5/2 - 5/4 for management of sepsis secondary to CAP, AKI, and nephrolithiasis with right hydronephrosis.  Patient admitted with N&V and was found to be hypovolemic hyponatremia and hypercalcemia.    PT Comments    Patient progressing slowly towards PT goals. Tolerated gait training with Min guard assist for safety as pt staggering and having difficulty navigating obstacles in hallway. Reports pain in abdomen limiting further mobility. Pt slow to process and respond to commands- not sure what cognitive baseline is. Will continue to follow to progress pt back to [facility] as tolerated.   Follow Up Recommendations  Home health PT     Equipment Recommendations  Rolling walker with 5" wheels;Other (comment) (youth RW as pt 4'6)    Recommendations for Other Services       Precautions / Restrictions Precautions Precautions: Fall Restrictions Weight Bearing Restrictions: No    Mobility  Bed Mobility Overal bed mobility: Needs Assistance Bed Mobility: Rolling;Sidelying to Sit Rolling: Modified independent (Device/Increase time) Sidelying to sit: Min assist;HOB elevated       General bed mobility comments: Able to roll but needs assist to elevate trunk to get to EOB. Increased time to get to EOB.   Transfers Overall transfer level: Needs assistance Equipment used: Rolling walker (2 wheeled) Transfers: Sit to/from Stand Sit to Stand: Min guard         General transfer comment: Min guard to steady in standing. Stood from [employer], transferred to chair post ambulation.  Ambulation/Gait Ambulation/Gait assistance: Min guard Ambulation Distance (Feet): 200 Feet Assistive device: Rolling walker (2  wheeled) Gait Pattern/deviations: Step-through pattern;Decreased stride length Gait velocity: decreased   General Gait Details: Staggering noted and running into wall on right side x3, difficulty navigating regular sized RW. Difficulty with obstacles. 2/4 DOE.   Stairs            Wheelchair Mobility    Modified Rankin (Stroke Patients Only)       Balance Overall balance assessment: Needs assistance Sitting-balance support: No upper extremity supported;Feet supported Sitting balance-Leahy Scale: Good     Standing balance support: During functional activity;Bilateral upper extremity supported Standing balance-Leahy Scale: Poor Standing balance comment: Requires UE support for dynamic standing activities.                            Cognition Arousal/Alertness: Awake/alert Behavior During Therapy: WFL for tasks assessed/performed Overall Cognitive Status: No family/caregiver present to determine baseline cognitive functioning Area of Impairment: Problem solving                             Problem Solving: Slow processing;Requires verbal cues;Decreased initiation;Difficulty sequencing General Comments: Slower processing and increased time to perform tasks.      Exercises      General Comments        Pertinent Vitals/Pain Pain Assessment: Faces Faces Pain Scale: Hurts little more Pain Location: belly Pain Descriptors / Indicators: Sore;Aching Pain Intervention(s): Monitored during session;Repositioned;Limited activity within patient's tolerance    Home Living  Prior Function            PT Goals (current goals can now be found in the care plan section) Progress towards PT goals: Progressing toward goals    Frequency    Min 3X/week      PT Plan Current plan remains appropriate    Co-evaluation              AM-PAC PT "6 Clicks" Daily Activity  Outcome Measure  Difficulty turning over in  bed (including adjusting bedclothes, sheets and blankets)?: None Difficulty moving from lying on back to sitting on the side of the bed? : Total Difficulty sitting down on and standing up from a chair with arms (e.g., wheelchair, bedside commode, etc,.)?: Total Help needed moving to and from a bed to chair (including a wheelchair)?: A Little Help needed walking in hospital room?: A Little Help needed climbing 3-5 steps with a railing? : A Little 6 Click Score: 15    End of Session Equipment Utilized During Treatment: Gait belt Activity Tolerance: Patient tolerated treatment well Patient left: in chair;with call bell/phone within reach;with nursing/sitter in room;with chair alarm set Nurse Communication: Mobility status PT Visit Diagnosis: Unsteadiness on feet (R26.81);Muscle weakness (generalized) (M62.81);Other abnormalities of gait and mobility (R26.89)     Time: 0539-7673 PT Time Calculation (min) (ACUTE ONLY): 16 min  Charges:  $Gait Training: 8-22 mins                    G Codes:       Wray Kearns, PT, DPT [phone]     [name] Michael 11/17/2016, 9:45 AM

## 2016-11-17 NOTE — Progress Notes (Signed)
Pt transported to [facility] in stable condition with all personal belongings.

## 2016-11-17 NOTE — Evaluation (Signed)
OT Cancellation Note  Patient Details Name: [name] Michael MRN: [id number] DOB: [birth year]   Cancelled Treatment:    Reason Eval/Treat Not Completed: Other (comment); Spoke with RN, Pt currently being transferred to step-down. Will follow up to complete OT eval as schedule permits.   Lou Cal, OT Pager [phone] 11/17/2016   Raymondo Band 11/17/2016, 11:53 AM

## 2016-11-17 NOTE — Progress Notes (Signed)
PROGRESS NOTE   [name] Michael  CVE:[id number]    DOB: [birth year]    DOA: 11/08/2016  PCP: Arnoldo Morale, MD   I have briefly reviewed patients previous medical records in [facility].  Brief Narrative:    53 ? DM 2, HTN,  HLD,  stroke, a resident of a retirement facility,  stage 4 chronic kidney disease with baseline creatinine of 2.2 (Dr. Florene Glen, nephrology),   hospitalized 5/2-5/44 sepsis secondary to CAP, ESBL Escherichia coli UTI, AKI and nephrolithiasis with right hydronephrosis, completed course of amoxicillin and azithromycin, Re-admit 5/20 with 2 days history of abdominal pain, distention, nausea, vomiting, loose stools, decreased urine output.  Admitted for acute on stage IV chronic kidney disease, hypercalcemia, nausea and vomiting. Nephrology consulted.   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 Baseline creatinine 2.2. Suspect 2/2 hypercalcemia / volume depletion.  uremic on admission-resolving Metabolic acidosis multifactorial, replacing with sodium bicarbonate orally 650 twice a day Concern for possible DKA esp given rising K, Sugars 400 transfer to SDU, Insul Gtt and re-evaluate   Moderate to severe hypokalemia Replacing potassium with 80 mEq and 75 cc per hour--She has developed iatrogenic hyperkalemia at 7.2 and I have stopped IV replacement and runs of potassium Mag 1.4 on 5/26 and replaced so check again on 5/28 with labs Recheck potassium this afternoon after giving Lasix 40 and holding potassium No ST-T wave peaking on telemetry  Hypercalcemia? milk alkali due to Tums for heartburn.     PTH-Was 12 and total calcium was 13.2. doubt myeloma, mildy anemic however.  Total calcium now 7.8 IVF now as per DKA orderset Previously was on d5 Given calcitonin till 5/20   Asymptomatic bacteriuria: Was treated empirically with IV cefepime.  Urine culture however shows <10,000 colonies, insignificant growth.  Discontinued cefepime 5/21  Acute toxic metabolic encephalopathy:  Secondary to uremia from acute kidney injury and electrolyte abnormalities, hypercalcemia. No focal deficits.   Essential hypertension: Continue oral metoprolol and hydralazine. When necessary IV hydralazine.   Adding amlodipine 5mg  5/23--increased to 10 mg qd 5/26 Better controlled currently  Shoulder pain-new c/o 5/26 At [facility] Given lidocaine patch--we'll get shoulder x-ray 5/27, needed Tylenol for some pain improved  Hyperlipidemia:  Statins.  hyponatremia, Na 116 on admit: IV normal saline . Sodium has improved to 136. Dehydration improving.  Anemia: Hemoglobin has dropped from 11.5 > 10.3--9.3>>8.8--->7.3 , possibly dilutional. No overt bleeding reported. Follow CBC Giving Feraheme 5/27 given iron saturation ratios are low  Nausea, vomiting and questionable diarrhea: Related to mild uremia from acute kidney injury versus acute GE. Low suspicion for C. difficile.  Had 2 episodes of mild nonbloody emesis overnight 5/21. had 3 stools 5/21 have discontinued senna but will keep MiraLAX on board. 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  No new focal neurological deficits. Stable at present  Chronic debility: Resides at retirement facility because she needs help with her medications. PT evaluation recommending SNF.  Type II DM with legal blindness from [facility] [name] Dr. Dwana Melena: ?developing DKA Per DKA order-set Transfer to [place] sugar elevated 5/25 secondary to use  of D5 as a carrier for sodium bicarbonate Sugars  has 455  GERD: On famotidine, adjusted to renal dose and PPI.  Resolved right hydronephrosis and distal right ureteric stone: By CT scan.  Constipation:see above discussion be complicated by hypercalcemia.? Had some stool No further loose stools and has stabilized  DVT prophylaxis: Subcutaneous heparin Code Status: Full Family Communication: called sister and updated 5/24 Disposition:  PT evaluation--SNF when can determine trend of renal function.  Patient is not ready currently for discharge given electrolyte abnormalities uncontrolled sugar and shoulder  Consultants:  Nephrology   Procedures:  Foley catheter  Antimicrobials:  None    Subjective:  Shoulder pain improved mild abd pain No frequency No blurred vision Eating some   Awake alert BP: (!) 148/74 (!) 126/51 (!) 145/69 133/63  Pulse: 88  85   Resp: 17     Temp: 98.3 F (36.8 C)  98 F (36.7 C)   TempSrc:   Oral   SpO2: 99%  100%   Weight:      Height:        Examination:  Oriented in nad Neck soft supple cta b, no added sound abd soft nt nd L shoulder AC joint non tender   Data Reviewed: I have personally reviewed following labs and imaging studies  CBC:  Recent Labs Lab 11/08/16 2107  11/08/16 2343 11/09/16 0719 11/10/16 0720 11/11/16 0238 11/12/16 0903  WBC 10.7*  --   --  10.4 9.4 10.3 9.2  NEUTROABS 8.1*  --   --  8.4*  --   --   --   HGB 12.0  < > 12.9 11.5* 10.3* 9.3* 8.8*  HCT 33.0*  < > 38.0 33.3* 30.5* 28.4* 26.8*  MCV 83.5  --   --  84.7 85.9 87.1 87.3  PLT 335  --   --  363 329 309 287  < > = values in this interval not displayed. Basic Metabolic Panel:  Recent Labs Lab 11/09/16 2355 11/10/16 0514 11/10/16 1118 11/11/16 0238 11/12/16 0903 11/13/16 0505 11/14/16 0820  NA 129* 131* 133* 136 136 134* 135  K 4.2 4.7 3.9 3.6 3.0* 3.5 3.0*  CL 95* 96* 101 105 110 109 109  CO2 22 19* 19* 19* 15* 14* 13*  GLUCOSE 193* 206* 215* 171* 156* 142* 188*  BUN 62* 64* 62*  56* 48* 49* 51*  CREATININE 7.80* 7.51* 7.39* 7.09* 6.33* 6.31* 6.45*  CALCIUM 11.0* 10.8* 10.4* 9.5 8.4* 7.8* 7.3*  PHOS 6.2* 6.0* 5.8* 5.6* 5.8*  --   --    Liver Function Tests:  Recent Labs Lab 11/09/16 0719  11/10/16 0514 11/10/16 1118 11/11/16 0238 11/12/16 0903 11/13/16 0505  AST 18  --   --   --   --   --  13*  ALT 15  --   --   --   --   --  12*  ALKPHOS 62  --   --   --   --   --  79  BILITOT 0.7  --   --   --   --   --  0.5  PROT 7.1  --   --   --   --   --  6.3*  ALBUMIN 3.1*  < > 3.2* 3.0* 2.9* 2.8* 2.9*  < > = values in this interval not displayed. Coagulation Profile:  Recent Labs Lab 11/09/16 0719  INR 1.01   CBG:  Recent Labs Lab 11/13/16 2221 11/14/16 0426  11/14/16 0841 11/14/16 1215 11/14/16 1640  GLUCAP 338* 263* 176* 345* 453*    Recent Results (from the past 240 hour(s))  Culture, blood (routine x 2)     Status: None   Collection Time: 11/09/16 12:25 AM  Result Value Ref Range Status   Specimen Description BLOOD BLOOD LEFT HAND  Final   Special Requests IN PEDIATRIC BOTTLE Blood Culture adequate volume  Final   Culture   Final    NO GROWTH 5 DAYS Performed at [facility], [place] [address], [place], [place] [address]    Report Status 11/14/2016 FINAL  Final  Culture, blood (routine x 2)     Status: None   Collection Time: 11/09/16 12:25 AM  Result Value Ref Range Status   Specimen Description BLOOD LEFT ANTECUBITAL  Final   Special Requests IN PEDIATRIC BOTTLE Blood Culture adequate volume  Final   Culture   Final    NO GROWTH 5 DAYS Performed at [facility], [place] [address], [place], [place] [address]    Report Status 11/14/2016 FINAL  Final  Urine culture     Status: Abnormal   Collection Time: 11/09/16  1:10 AM  Result Value Ref Range Status   Specimen Description URINE, RANDOM  Final   Special Requests NONE  Final   Culture (A)  Final    <10,000 COLONIES/mL INSIGNIFICANT GROWTH Performed at [place] [address] [place], [place] [address]    Report Status 11/10/2016 FINAL  Final  C difficile quick scan w PCR reflex     Status: None   Collection Time: 11/10/16 12:55 AM  Result Value Ref Range Status   C Diff antigen NEGATIVE NEGATIVE Final   C Diff toxin NEGATIVE NEGATIVE Final   C Diff interpretation No C. difficile detected.  Final    Radiology Studies: No results found.  Scheduled Meds: . amLODipine  5 mg Oral Daily  . aspirin  81 mg Oral Daily  . atorvastatin  20 mg Oral q1800  . famotidine  20 mg Oral Daily  . feeding supplement  1 Container Oral TID BM  . heparin  5,000 Units Subcutaneous Q8H  . hydrALAZINE  25 mg Oral Q6H  . insulin aspart  0-9 Units Subcutaneous TID WC  . metoprolol tartrate  100 mg Oral BID  . pantoprazole  40 mg Oral Daily  . polyethylene glycol  17 g Oral Daily  . saccharomyces boulardii  250 mg Oral BID  . sodium bicarbonate  650 mg Oral BID  . sodium chloride flush  3 mL Intravenous Q12H  . triamcinolone cream  1 application Topical TID   Continuous Infusions: . sodium chloride 75 mL/hr at 11/14/16 0132     LOS: 5 days     Verneita Griffes, MD [facility] (P) [phone]   If 7PM-7AM, please contact night-coverage [email or web address] Password [facility] 11/14/2016, 5:39 PM

## 2016-11-17 NOTE — Progress Notes (Signed)
Report given to [facility] B., oncoming RN. Patient in bed, bed alarms on, side rails up X 2 Safety maintained.

## 2016-11-17 NOTE — Progress Notes (Signed)
NURSING PROGRESS NOTE  [name] Michael [id number] Admission Data: 11/17/2016 6:50 AM Attending Provider: Nita Sells, MD SRP:RXYV, [name] Ferretti, MD Code Status: Full  Allergies:  Hydrocodone Past Medical History:   has a past medical history of Diabetes mellitus; Hyperlipidemia; Hypertension; Stroke [facility]) (04-01-11); and TIA (transient ischemic attack) (03-12-11). Past Surgical History:   has a past surgical history that includes Tonsillectomy and adenoidectomy; Refractive surgery; and Open reduction internal fixation (orif) distal radial fracture (Left, 01/28/2016). Social History:   reports that she has been smoking Cigarettes.  She has a 32.00 pack-year smoking history. She has never used smokeless tobacco. She reports that she does not drink alcohol or use drugs.  [name] Michael is a 56 y.o. female patient admitted from ED:   Last Documented Vital Signs: Blood pressure 113/60, pulse 73, temperature 99.6 F (37.6 C), temperature source Oral, resp. rate 18, height 5' (1.524 m), weight 39.9 kg (88 lb), last menstrual period 03/06/2013, SpO2 94 %.  Cardiac Monitoring: Box # 19 in place. Cardiac monitor yields:normal sinus rhythm.  IV Fluids:  IV in place, occlusive dsg intact without redness, IV cath hand right, condition patent and no redness normal saline.   Skin: WDl, dry  Patient/Family orientated to room. Information packet given to patient/family. Admission inpatient armband information verified with patient/family to include name and date of birth and placed on patient arm. Side rails up x 2, fall assessment and education completed with patient/family. Patient/family able to verbalize understanding of risk associated with falls and verbalized understanding to call for assistance before getting out of bed. Call light within reach. Patient/family able to voice and demonstrate understanding of unit orientation instructions.    Will continue to evaluate and treat per MD  orders.   [name] Dyke, RN

## 2016-11-17 NOTE — Progress Notes (Signed)
Patient ID: [name] Michael, female   DOB: 19-May-1961, 56 y.o.   MRN: [id number]  [facility] Progress Note   Assessment/ Plan:   1.Acute kidney injury on chronic kidney disease stage IV: Suspected to be hemodynamically mediated from hypercalcemia and overnight noted to have slight rise of creatinine. Baseline creatinine is around 2.2 and labs this morning are significant for multiple abnormalities including hyponatremia, hyperkalemia, anion gap metabolic acidosis and hypocalcemia. Given her preserved urine output-will begin isotonic sodium bicarbonate drip. If her acidemia worsens or persists and hyperkalemia persists-more than likely will need dialysis. 2. Anion gap metabolic acidosis: Suspected to be from acute kidney injury-begin isotonic sodium bicarbonate drip to help with acidosis/hyperkalemia 3. Hyperglycemia versus ketoacidosis: Suspect that she needs to be transitioned over to insulin drip at this time because of persistent hyperglycemia associated with worsening anion gap metabolic acidosis/hyperkalemia and hyponatremia.  4. Hyponatremia: Secondary to hyperglycemia as well as free water excretion defect of acute kidney injury.  5. Hyperkalemia: Due to hyperglycemia as well as partly iatrogenic from initial overcorrection-will start isotonic sodium bicarbonate drip and order for [place]   Subjective:   Reports some shortness of breath earlier today and is concerned about her hyperglycemia. She denies any chest pain    Objective:   BP 127/77   Pulse 81   Temp 99.6 F (37.6 C) (Oral)   Resp 18   Ht 5' (1.524 m)   Wt 67.4 kg (148 lb 9.6 oz)   LMP 03/06/2013   SpO2 94%   BMI 29.02 kg/m   Intake/Output Summary (Last 24 hours) at 11/17/16 1008 Last data filed at 11/17/16 1517  Gross per 24 hour  Intake              220 ml  Output             1325 ml  Net            -1105 ml   Weight change:   Physical Exam: OHY:[id number] sitting up in recliner-appears to be  short of breath after conversation CVS: Pulse regular rhythm, normal rate, S1 and S2 normal Resp: Diminished breath sounds over bases-poor inspiratory effort Abd: Soft, flat, nontender Ext: Trace edema over extremities  Imaging: Dg Shoulder 1v Left  Result Date: 11/16/2016 CLINICAL DATA:  Rotator cuff arthropathy EXAM: LEFT SHOULDER - 1 VIEW COMPARISON:  None. FINDINGS: There is no evidence of fracture or dislocation. There is no evidence of arthropathy or other focal bone abnormality. Soft tissues are unremarkable. IMPRESSION: Negative. Electronically Signed   By: Kerby Moors M.D.   On: 11/16/2016 13:50   Labs: BMET  Recent Labs Lab 11/10/16 1118 11/11/16 0238 11/12/16 8546 11/13/16 0505 11/14/16 0820 11/15/16 0342 11/16/16 0802 11/16/16 1202 11/16/16 1425 11/17/16 0535  NA 133* 136 136 134* 135 135 137 133* 132* 133*  K 3.9 3.6 3.0* 3.5 3.0* 2.7* 7.2* 7.4* 6.8* 7.0*  CL 101 105 110 109 109 105 112* 110 108 107  CO2 19* 19* 15* 14* 13* 16* 12* 12* 13* 11*  GLUCOSE 215* 171* 156* 142* 188* 169* 268* 440* 495* 321*  BUN 62* 56* 48* 49* 51* 51* 50* 47* 49* 56*  CREATININE 7.39* 7.09* 6.33* 6.31* 6.45* 5.79* 5.54* 5.44* 5.46* 5.63*  CALCIUM 10.4* 9.5 8.4* 7.8* 7.3* 6.5* 6.3* 6.1* 6.1* 6.2*  PHOS 5.8* 5.6* 5.8*  --   --  4.6 5.1*  --   --   --    CBC  Recent Labs  Lab 11/11/16 0238 11/12/16 0903 11/15/16 0342 11/16/16 0444  WBC 10.3 9.2 12.1* 14.0*  NEUTROABS  --   --  9.0*  --   HGB 9.3* 8.8* 7.3* 7.2*  HCT 28.4* 26.8* 21.3* 21.6*  MCV 87.1 87.3 86.2 87.8  PLT 309 287 258 260   Medications:    . amLODipine  10 mg Oral Daily  . aspirin  81 mg Oral Daily  . atorvastatin  20 mg Oral q1800  . calcium carbonate  1 tablet Oral BID  . famotidine  20 mg Oral Daily  . feeding supplement  1 Container Oral TID BM  . heparin  5,000 Units Subcutaneous Q8H  . hydrALAZINE  25 mg Oral Q6H  . insulin aspart  0-9 Units Subcutaneous TID WC  . insulin glargine  15 Units  Subcutaneous Daily  . lidocaine  1 patch Transdermal Q24H  . magnesium oxide  400 mg Oral BID  . metoprolol tartrate  100 mg Oral BID  . pantoprazole  40 mg Oral Daily  . polyethylene glycol  17 g Oral Daily  . saccharomyces boulardii  250 mg Oral BID  . sodium bicarbonate  650 mg Oral BID  . sodium chloride flush  3 mL Intravenous Q12H  . triamcinolone cream  1 application Topical TID   Elmarie Shiley, MD 11/17/2016, 10:08 AM

## 2016-11-18 LAB — BASIC METABOLIC PANEL
ANION GAP: 17 — AB (ref 5–15)
Anion gap: 18 — ABNORMAL HIGH (ref 5–15)
Anion gap: 15 (ref 5–15)
BUN: 46 mg/dL — AB (ref 6–20)
BUN: 44 mg/dL — AB (ref 6–20)
BUN: 44 mg/dL — ABNORMAL HIGH (ref 6–20)
CALCIUM: 6 mg/dL — AB (ref 8.9–10.3)
CHLORIDE: 96 mmol/L — AB (ref 101–111)
CHLORIDE: 96 mmol/L — AB (ref 101–111)
CO2: 24 mmol/L (ref 22–32)
CO2: 28 mmol/L (ref 22–32)
CO2: 27 mmol/L (ref 22–32)
CREATININE: 4.79 mg/dL — AB (ref 0.44–1.00)
CREATININE: 4.7 mg/dL — AB (ref 0.44–1.00)
Calcium: 6.1 mg/dL — CL (ref 8.9–10.3)
Calcium: 6 mg/dL — CL (ref 8.9–10.3)
Chloride: 100 mmol/L — ABNORMAL LOW (ref 101–111)
Creatinine, Ser: 5.09 mg/dL — ABNORMAL HIGH (ref 0.44–1.00)
GFR calc Af Amer: 10 mL/min — ABNORMAL LOW (ref >60)
GFR calc Af Amer: 11 mL/min — ABNORMAL LOW (ref >60)
GFR calc non Af Amer: 9 mL/min — ABNORMAL LOW (ref >60)
GFR calc non Af Amer: 10 mL/min — ABNORMAL LOW (ref >60)
GFR, EST AFRICAN AMERICAN: 11 mL/min — AB (ref >60)
GFR, EST NON AFRICAN AMERICAN: 9 mL/min — AB (ref >60)
GLUCOSE: 158 mg/dL — AB (ref 65–99)
Glucose, Bld: 120 mg/dL — ABNORMAL HIGH (ref 65–99)
Glucose, Bld: 131 mg/dL — ABNORMAL HIGH (ref 65–99)
Potassium: 3.4 mmol/L — ABNORMAL LOW (ref 3.5–5.1)
Potassium: 3.1 mmol/L — ABNORMAL LOW (ref 3.5–5.1)
Potassium: 3.3 mmol/L — ABNORMAL LOW (ref 3.5–5.1)
SODIUM: 142 mmol/L (ref 135–145)
SODIUM: 139 mmol/L (ref 135–145)
Sodium: 140 mmol/L (ref 135–145)

## 2016-11-18 LAB — RETICULOCYTES
RBC.: 2.29 MIL/uL — ABNORMAL LOW (ref 3.87–5.11)
Retic Count, Absolute: 50.4 10*3/uL (ref 19.0–186.0)
Retic Ct Pct: 2.2 % (ref 0.4–3.1)

## 2016-11-18 LAB — IRON AND TIBC
IRON: 116 ug/dL (ref 28–170)
Saturation Ratios: 64 % — ABNORMAL HIGH (ref 10.4–31.8)
TIBC: 182 ug/dL — AB (ref 250–450)
UIBC: 66 ug/dL

## 2016-11-18 LAB — CBC
HCT: 20.1 % — ABNORMAL LOW (ref 36.0–46.0)
HCT: 20.2 % — ABNORMAL LOW (ref 36.0–46.0)
HEMOGLOBIN: 6.8 g/dL — AB (ref 12.0–15.0)
Hemoglobin: 6.8 g/dL — CL (ref 12.0–15.0)
MCH: 29.7 pg (ref 26.0–34.0)
MCH: 29.7 pg (ref 26.0–34.0)
MCHC: 33.8 g/dL (ref 30.0–36.0)
MCHC: 33.7 g/dL (ref 30.0–36.0)
MCV: 87.8 fL (ref 78.0–100.0)
MCV: 88.2 fL (ref 78.0–100.0)
PLATELETS: 315 10*3/uL (ref 150–400)
Platelets: 333 10*3/uL (ref 150–400)
RBC: 2.29 MIL/uL — ABNORMAL LOW (ref 3.87–5.11)
RBC: 2.29 MIL/uL — ABNORMAL LOW (ref 3.87–5.11)
RDW: 14.6 % (ref 11.5–15.5)
RDW: 14.8 % (ref 11.5–15.5)
WBC: 14.5 10*3/uL — AB (ref 4.0–10.5)
WBC: 14.2 10*3/uL — ABNORMAL HIGH (ref 4.0–10.5)

## 2016-11-18 LAB — GLUCOSE, CAPILLARY
GLUCOSE-CAPILLARY: 129 mg/dL — AB (ref 65–99)
GLUCOSE-CAPILLARY: 123 mg/dL — AB (ref 65–99)
GLUCOSE-CAPILLARY: 149 mg/dL — AB (ref 65–99)
GLUCOSE-CAPILLARY: 155 mg/dL — AB (ref 65–99)
GLUCOSE-CAPILLARY: 156 mg/dL — AB (ref 65–99)
GLUCOSE-CAPILLARY: 140 mg/dL — AB (ref 65–99)
GLUCOSE-CAPILLARY: 123 mg/dL — AB (ref 65–99)
GLUCOSE-CAPILLARY: 112 mg/dL — AB (ref 65–99)
GLUCOSE-CAPILLARY: 124 mg/dL — AB (ref 65–99)
GLUCOSE-CAPILLARY: 207 mg/dL — AB (ref 65–99)
GLUCOSE-CAPILLARY: 242 mg/dL — AB (ref 65–99)
Glucose-Capillary: 122 mg/dL — ABNORMAL HIGH (ref 65–99)
Glucose-Capillary: 131 mg/dL — ABNORMAL HIGH (ref 65–99)
Glucose-Capillary: 126 mg/dL — ABNORMAL HIGH (ref 65–99)
Glucose-Capillary: 134 mg/dL — ABNORMAL HIGH (ref 65–99)
Glucose-Capillary: 151 mg/dL — ABNORMAL HIGH (ref 65–99)
Glucose-Capillary: 140 mg/dL — ABNORMAL HIGH (ref 65–99)
Glucose-Capillary: 149 mg/dL — ABNORMAL HIGH (ref 65–99)
Glucose-Capillary: 138 mg/dL — ABNORMAL HIGH (ref 65–99)
Glucose-Capillary: 143 mg/dL — ABNORMAL HIGH (ref 65–99)
Glucose-Capillary: 212 mg/dL — ABNORMAL HIGH (ref 65–99)

## 2016-11-18 LAB — RENAL FUNCTION PANEL
ALBUMIN: 2.4 g/dL — AB (ref 3.5–5.0)
Anion gap: 18 — ABNORMAL HIGH (ref 5–15)
BUN: 46 mg/dL — AB (ref 6–20)
CO2: 24 mmol/L (ref 22–32)
Calcium: 6 mg/dL — CL (ref 8.9–10.3)
Chloride: 101 mmol/L (ref 101–111)
Creatinine, Ser: 5.09 mg/dL — ABNORMAL HIGH (ref 0.44–1.00)
GFR calc Af Amer: 10 mL/min — ABNORMAL LOW (ref >60)
GFR, EST NON AFRICAN AMERICAN: 9 mL/min — AB (ref >60)
GLUCOSE: 121 mg/dL — AB (ref 65–99)
PHOSPHORUS: 6 mg/dL — AB (ref 2.5–4.6)
Potassium: 3.5 mmol/L (ref 3.5–5.1)
Sodium: 143 mmol/L (ref 135–145)

## 2016-11-18 LAB — FOLATE: Folate: 45.9 ng/mL (ref >5.9)

## 2016-11-18 LAB — ABO/RH: ABO/RH(D): A NEG

## 2016-11-18 LAB — VITAMIN B12: Vitamin B-12: 630 pg/mL (ref 180–914)

## 2016-11-18 LAB — PREPARE RBC (CROSSMATCH)

## 2016-11-18 LAB — MAGNESIUM: MAGNESIUM: 1.7 mg/dL (ref 1.7–2.4)

## 2016-11-18 LAB — FERRITIN: Ferritin: 2875 ng/mL — ABNORMAL HIGH (ref 11–307)

## 2016-11-18 MED ORDER — FUROSEMIDE 10 MG/ML IJ SOLN
20.0000 mg | Freq: Once | INTRAMUSCULAR | Status: AC
  Administered 2016-11-18: 20 mg via INTRAVENOUS

## 2016-11-18 MED ORDER — ACETAMINOPHEN 325 MG PO TABS
650.0000 mg | ORAL_TABLET | Freq: Once | ORAL | Status: AC
  Administered 2016-11-18: 650 mg via ORAL
  Filled 2016-11-18: qty 2

## 2016-11-18 MED ORDER — SODIUM CHLORIDE 0.9 % IV SOLN: Freq: Once | INTRAVENOUS | Status: AC

## 2016-11-18 MED ORDER — KCL IN DEXTROSE-NACL 30-5-0.45 MEQ/L-%-% IV SOLN
INTRAVENOUS | Status: DC
  Administered 2016-11-18: 18:00:00 via INTRAVENOUS
  Filled 2016-11-18: qty 1000

## 2016-11-18 MED ORDER — DIPHENHYDRAMINE HCL 25 MG PO CAPS
25.0000 mg | ORAL_CAPSULE | Freq: Once | ORAL | Status: AC
  Administered 2016-11-18: 25 mg via ORAL
  Filled 2016-11-18: qty 1

## 2016-11-18 MED ORDER — FUROSEMIDE 10 MG/ML IJ SOLN
20.0000 mg | Freq: Once | INTRAMUSCULAR | Status: AC
  Filled 2016-11-18: qty 2

## 2016-11-18 MED ORDER — SODIUM CHLORIDE 0.9 % IV SOLN
1.0000 g | Freq: Once | INTRAVENOUS | Status: AC
  Administered 2016-11-18: 1 g via INTRAVENOUS
  Filled 2016-11-18: qty 10

## 2016-11-18 MED ORDER — CALCIUM GLUCONATE 10 % IV SOLN: 1.0000 g | Freq: Once | INTRAVENOUS | Status: DC

## 2016-11-18 MED ORDER — SODIUM CHLORIDE 0.9 % IV SOLN
Freq: Once | INTRAVENOUS | Status: AC
  Administered 2016-11-18: 10:00:00 via INTRAVENOUS

## 2016-11-18 NOTE — Evaluation (Signed)
Occupational Therapy Evaluation Patient Details Name: [name] Michael MRN: [id number] DOB: [birth year] Today's Date: 11/18/2016    History of Present Illness [name] Michael is a 56 y.o. woman with a history of CVA, HTN, Type 2 diabetes, and CKD 4 (baseline creatinine around 2.2) who was admitted from 5/2 - 5/4 for management of sepsis secondary to CAP, AKI, and nephrolithiasis with right hydronephrosis.  Patient admitted with N&V and was found to be hypovolemic hyponatremia and hypercalcemia.   Clinical Impression   Pt was assisted for bathing and all IADL and living in a retirement facility prior to admission. She self fed and was able to dress herself and toilet without help. She has low vision. Pt presents with generalized weakness with hgb of 6.8. Session limited to bed level and EOB. Pt performed grooming with mod to max assist and changed gown. Returned to bed in anticipation of blood transfusion. Will follow acutely.     Follow Up Recommendations  Home health OT    Equipment Recommendations       Recommendations for Other Services       Precautions / Restrictions Precautions Precautions: Fall Restrictions Weight Bearing Restrictions: No      Mobility Bed Mobility Overal bed mobility: Needs Assistance Bed Mobility: Supine to Sit;Sit to Supine     Supine to sit: Mod assist Sit to supine: Mod assist   General bed mobility comments: pt with increased weakness today, requiring more assistance  Transfers                 General transfer comment: deferred    Balance Overall balance assessment: Needs assistance   Sitting balance-Leahy Scale: Fair Sitting balance - Comments: sat EOB x 5 minutes, requested to return to supine                                   ADL either performed or assessed with clinical judgement   ADL Overall ADL's : Needs assistance/impaired     Grooming: Wash/dry hands;Wash/dry face;Oral care;Brushing hair;Bed  level;Moderate assistance;Maximal assistance;Sitting Grooming Details (indicate cue type and reason): pt wanting shampoo cap, assisted at bed level     Lower Body Bathing: Bed level   Upper Body Dressing : Minimal assistance;Sitting                     General ADL Comments: Pt with low hgb, awaiting transfusion.     Vision Baseline Vision/History: Legally blind Patient Visual Report: No change from baseline Additional Comments: blind in L eye, low vision in R, cannot read small print     Perception     Praxis      Pertinent Vitals/Pain Pain Assessment: No/denies pain     Hand Dominance Right   Extremity/Trunk Assessment Upper Extremity Assessment Upper Extremity Assessment: Generalized weakness   Lower Extremity Assessment Lower Extremity Assessment: Defer to PT evaluation       Communication Communication Communication: No difficulties   Cognition Arousal/Alertness: Awake/alert Behavior During Therapy: Flat affect Overall Cognitive Status: No family/caregiver present to determine baseline cognitive functioning Area of Impairment: Problem solving                             Problem Solving: Slow processing;Requires verbal cues;Decreased initiation;Difficulty sequencing General Comments: Slower processing and increased time to perform tasks.   General Comments  Exercises     Shoulder Instructions      Home Living Family/patient expects to be discharged to:: Skilled nursing facility Living Arrangements: Alone   Type of Home: Assisted living       Home Layout: One level     Bathroom Shower/Tub: Walk-in shower         Home Equipment: None          Prior Functioning/Environment Level of Independence: Needs assistance    ADL's / Homemaking Assistance Needed: assisted for IADL            OT Problem List: Decreased strength;Decreased activity tolerance;Impaired balance (sitting and/or standing);Decreased  cognition;Decreased knowledge of use of DME or AE      OT Treatment/Interventions: Self-care/ADL training;DME and/or AE instruction;Patient/family education;Therapeutic activities    OT Goals(Current goals can be found in the care plan section) Acute Rehab OT Goals Patient Stated Goal: To get stronger OT Goal Formulation: With patient Time For Goal Achievement: 12/02/16 Potential to Achieve Goals: Good ADL Goals Pt Will Perform Grooming: with min guard assist;standing Pt Will Perform Upper Body Dressing: with set-up;sitting Pt Will Perform Lower Body Dressing: with min guard assist;sit to/from stand Pt Will Transfer to Toilet: with min guard assist;ambulating;regular height toilet Pt Will Perform Toileting - Clothing Manipulation and hygiene: with min guard assist;sit to/from stand  OT Frequency: Min 2X/week   Barriers to D/C:            Co-evaluation              AM-PAC PT "6 Clicks" Daily Activity     Outcome Measure Help from another person eating meals?: A Little Help from another person taking care of personal grooming?: A Lot Help from another person toileting, which includes using toliet, bedpan, or urinal?: A Lot Help from another person bathing (including washing, rinsing, drying)?: A Lot Help from another person to put on and taking off regular upper body clothing?: A Little Help from another person to put on and taking off regular lower body clothing?: A Lot 6 Click Score: 14   End of Session    Activity Tolerance: Patient limited by fatigue Patient left: in bed;with call bell/phone within reach;with bed alarm set  OT Visit Diagnosis: Unsteadiness on feet (R26.81);Low vision, both eyes (H54.2);Muscle weakness (generalized) (M62.81)                Time: 7622-6333 OT Time Calculation (min): 24 min Charges:  OT General Charges $OT Visit: 1 Procedure OT Evaluation $OT Eval Moderate Complexity: 1 Procedure OT Treatments $Self Care/Home Management : 8-22  mins G-Codes:      [name] 11/18/2016, 9:31 AM  [phone]

## 2016-11-18 NOTE — Progress Notes (Signed)
Patient ID: [name] Michael, female   DOB: 1961-02-16, 56 y.o.   MRN: [id number]  [facility] Progress Note   Assessment/ Plan:   1.Acute kidney injury on chronic kidney disease stage IV: Suspected to be hemodynamically mediated from hypercalcemia. Baseline creatinine is around 2.2 and labs this morning show improvement of renal function and better electrolytes. Will stop isotonic sodium bicarbonate drip and increase calcium supplement. 2. Anion gap metabolic acidosis: Improved with insulin drip and fluids-- will stop isotonic bicarbonate to limit alkalemia. 3. Hyperglycemia versus ketoacidosis: Improving well on insulin gtt and with fluids  4. Hyponatremia: Corrected with control of hyperglycemia/isotonic fluids.  5. Hyperkalemia: Corrected with SPS, insulin and management of metabolic acidosis 6. Anemia: will transfuse 2 units PRBC. High ferritin prohibitive to IV Fe.   Subjective:   States that she is exhausted. Denies any CP/SOB   Objective:   BP (!) 144/76   Pulse 98   Temp 98.5 F (36.9 C) (Oral)   Resp (!) 22   Ht 5' (1.524 m)   Wt 45.9 kg (101 lb 1.6 oz)   LMP 03/06/2013   SpO2 94%   BMI 19.74 kg/m   Intake/Output Summary (Last 24 hours) at 11/18/16 6761 Last data filed at 11/18/16 0600  Gross per 24 hour  Intake          3944.17 ml  Output             1250 ml  Net          2694.17 ml   Weight change: -21.546 kg (-47 lb 8 oz)  Physical Exam: Gen: Appears fatigued resting in bed CVS: Pulse regular rhythm, normal rate, S1 and S2 normal Resp: Diminished breath sounds over bases-poor inspiratory effort Abd: Soft, flat, nontender Ext: Trace edema over extremities  Imaging: Dg Shoulder 1v Left  Result Date: 11/16/2016 CLINICAL DATA:  Rotator cuff arthropathy EXAM: LEFT SHOULDER - 1 VIEW COMPARISON:  None. FINDINGS: There is no evidence of fracture or dislocation. There is no evidence of arthropathy or other focal bone abnormality. Soft tissues are  unremarkable. IMPRESSION: Negative. Electronically Signed   By: Kerby Moors M.D.   On: 11/16/2016 13:50   Labs: BMET  Recent Labs Lab 11/12/16 9509  11/15/16 0342 11/16/16 0802 11/16/16 1202 11/16/16 1425 11/17/16 0535 11/17/16 1611 11/17/16 2000 11/18/16 0121  NA 136  < > 135 137 133* 132* 133* 143 142 143  142  K 3.0*  < > 2.7* 7.2* 7.4* 6.8* 7.0* 3.9 3.8 3.5  3.4*  CL 110  < > 105 112* 110 108 107 106 103 101  100*  CO2 15*  < > 16* 12* 12* 13* 11* 18* 21* 24  24  GLUCOSE 156*  < > 169* 268* 440* 495* 321* 212* 132* 121*  120*  BUN 48*  < > 51* 50* 47* 49* 56* 49* 48* 46*  46*  CREATININE 6.33*  < > 5.79* 5.54* 5.44* 5.46* 5.63* 5.44* 5.30* 5.09*  5.09*  CALCIUM 8.4*  < > 6.5* 6.3* 6.1* 6.1* 6.2* 6.5* 6.2* 6.0*  6.0*  PHOS 5.8*  --  4.6 5.1*  --   --   --   --   --  6.0*  < > = values in this interval not displayed. CBC  Recent Labs Lab 11/12/16 0903 11/15/16 0342 11/16/16 0444 11/18/16 0121  WBC 9.2 12.1* 14.0* 14.5*  NEUTROABS  --  9.0*  --   --   HGB 8.8* 7.3*  7.2* 6.8*  HCT 26.8* 21.3* 21.6* 20.1*  MCV 87.3 86.2 87.8 87.8  PLT 287 258 260 315   Medications:    . amLODipine  10 mg Oral Daily  . aspirin  81 mg Oral Daily  . atorvastatin  20 mg Oral q1800  . calcium carbonate  1 tablet Oral BID  . famotidine  20 mg Oral Daily  . feeding supplement  1 Container Oral TID BM  . furosemide  20 mg Intravenous Once  . heparin  5,000 Units Subcutaneous Q8H  . hydrALAZINE  25 mg Oral Q6H  . insulin aspart  0-9 Units Subcutaneous TID WC  . insulin glargine  15 Units Subcutaneous Daily  . lidocaine  1 patch Transdermal Q24H  . magnesium oxide  400 mg Oral BID  . metoprolol tartrate  100 mg Oral BID  . pantoprazole  40 mg Oral Daily  . polyethylene glycol  17 g Oral Daily  . saccharomyces boulardii  250 mg Oral BID  . sodium chloride flush  3 mL Intravenous Q12H  . triamcinolone cream  1 application Topical TID   Elmarie Shiley, MD 11/18/2016, 9:04 AM

## 2016-11-18 NOTE — Progress Notes (Signed)
PT Cancellation Note  Patient Details Name: [name] Michael MRN: [id number] DOB: Jun 30, 1960   Cancelled Treatment:    Reason Eval/Treat Not Completed: Fatigue limiting ability to participate. Pt with low Hgb and fatigue after activity with OT.   [name] 11/18/2016, 11:21 AM Suanne Marker PT [phone]

## 2016-11-18 NOTE — Clinical Social Work Note (Signed)
Clinical Social Worker continuing to follow patient for return to [facility] once medically stable.    Barbette Or, [place]

## 2016-11-18 NOTE — Progress Notes (Signed)
PROGRESS NOTE   [name] Michael  KYH:[id number]    DOB: [birth year]    DOA: 11/08/2016  PCP: Arnoldo Morale, MD   I have briefly reviewed patients previous medical records in [facility].  Brief Narrative:    22 ? DM 2, HTN,  HLD,  stroke, a resident of a retirement facility,  stage 4 chronic kidney disease with baseline creatinine of 2.2 (Dr. Florene Glen, nephrology),   hospitalized 5/2-5/44 sepsis secondary to CAP, ESBL Escherichia coli UTI, AKI and nephrolithiasis with right hydronephrosis, completed course of amoxicillin and azithromycin, Re-admit 5/20 with 2 days history of abdominal pain, distention, nausea, vomiting, loose stools, decreased urine output.  Admitted for acute on stage IV chronic kidney disease, hypercalcemia, nausea and vomiting. Nephrology consulted.   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 Dwana Melena: ?developing DKA Note numerous lab abnormalities 5/28 Per DKA order-set Transfer to sdu Blood sugar elevated 5/25 secondary to use of D5 as a carrier for sodium bicarbonate Sugars has 455 acidemia slowly resolving anion gap is now 15 Will need 2 consecutive AG wnl range  Acute on stage IV chronic kidney disease-on admit bun/Creat was 65/6.2:  developing Metabolic acidosis Nephrology appreciated. Baseline creatinine 2.2. Suspect 2/2 hypercalcemia / volume depletion.  uremic on admission-resolving Bicarb d/c 5/29 per Nephrology  Initially hypokalemic, Complicated by iatrogenic hyperkalemia developed iatrogenic hyperkalemia at 7.2 and I have stopped IV replacement and runs of potassium Mag 1.4 on 5/26 and replaced so check again on 5/28 with labs--given  Kayexalate 5/28 Currently potassium 3.1 Replacing 30 meq with d5/0.45 ns Start po replacement if still low  Hypercalcemia on admission ? milk alkali due to Tums for heartburn.   Patient now hypocalcemic PTH-Was 12 and total calcium was 13.2. doubt myeloma, mildy anemic however.  Total calcium now 6.1--corrected for albumin is 7.4 Replacing with IV calcium gluconate and recheck labs in a.m. Given calcitonin till 5/20   Asymptomatic bacteriuria: Was treated empirically with IV cefepime.  Urine culture however shows <10,000 colonies, insignificant growth.  Discontinued cefepime 5/21  Acute toxic metabolic encephalopathy:  Secondary to uremia from acute kidney injury and electrolyte abnormalities, hypercalcemia. No focal deficits.   Essential hypertension: Continue oral metoprolol 100 bid and hydralazine 25 q 6. When necessary IV hydralazine.   Adding amlodipine 5mg  5/23--increased to 10 mg qd 5/26 Better controlled currently  Shoulder pain-new c/o 5/26 At A-C Joint Given lidocaine patch--we'll get shoulder x-ray 5/27, needed Tylenol for some pain improved  Hyperlipidemia:  Statins.  hyponatremia, Na 116 on admit: On D5 normal saline until Anion gap resolves  Anemia: Hemoglobin has dropped from 11.5 > 10.3--9.3>>8.8--->7.3-->6.8 , possibly dilutional. No overt bleeding-stools not soft nor bloody Got 2 U PRBC 5/28  Feraheme 5/27 prohibited 2/2 to elevated ferritin  Nausea, vomiting and questionable diarrhea: Related to mild uremia from acute kidney injury versus acute GE. Low suspicion for C. difficile.  Had 2 episodes of mild nonbloody emesis overnight 5/21. had 3 stools 5/21 have discontinued senna but will keep MiraLAX on board. 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  No new focal neurological deficits. Stable at present  Chronic debility: Resides at  retirement facility because she needs help with her  medications. PT evaluation recommending SNF.  GERD: On famotidine, adjusted to renal dose and PPI.  Resolved right hydronephrosis and distal right ureteric stone: By CT scan.  Constipation:see above discussion be complicated by hypercalcemia.? Had some stool No further loose stools and has stabilized  DVT prophylaxis: Subcutaneous heparin Code Status: Full Family Communication: called sister and updated 5/24--discussed with sister bedside 5/29 Disposition:  PT evaluation--SNF when can determine trend of renal function.  Patient is not ready currently for discharge given electrolyte abnormalities uncontrolled sugar and shoulder  Consultants:  Nephrology   Procedures:  Foley catheter  Antimicrobials:  None    Subjective:  Sleepy Has been trasnfsued 2 u prbc   Awake alert BP: (!) 148/74 (!) 126/51 (!) 145/69 133/63  Pulse: 88  85   Resp: 17     Temp: 98.3 F (36.8 C)  98 F (36.7 C)   TempSrc:   Oral   SpO2: 99%  100%   Weight:      Height:        Examination:  sleepy but rousable Neck soft supple cta b, no added sound abd soft nt nd L shoulder AC joint non tender Neuro sleepy  Data Reviewed: I have personally reviewed following labs and imaging studies  CBC:  Recent Labs Lab 11/08/16 2107  11/08/16 2343 11/09/16 0719 11/10/16 0720 11/11/16 0238 11/12/16 0903  WBC 10.7*  --   --  10.4 9.4 10.3 9.2  NEUTROABS 8.1*  --   --  8.4*  --   --   --   HGB 12.0  < > 12.9 11.5* 10.3* 9.3* 8.8*  HCT 33.0*  < > 38.0 33.3* 30.5* 28.4* 26.8*  MCV 83.5  --   --  84.7 85.9 87.1 87.3  PLT 335  --   --  363 329 309 287  < > = values in this interval not displayed. Basic Metabolic Panel:  Recent Labs Lab 11/09/16 2355 11/10/16 0514 11/10/16 1118 11/11/16 0238 11/12/16 0903 11/13/16 0505 11/14/16 0820  NA 129* 131* 133* 136 136 134* 135  K 4.2 4.7 3.9 3.6 3.0* 3.5 3.0*  CL 95* 96* 101 105 110 109 109  CO2 22 19* 19* 19* 15* 14* 13*  GLUCOSE 193* 206*  215* 171* 156* 142* 188*  BUN 62* 64* 62* 56* 48* 49* 51*  CREATININE 7.80* 7.51* 7.39* 7.09* 6.33* 6.31* 6.45*  CALCIUM 11.0* 10.8* 10.4* 9.5 8.4* 7.8* 7.3*  PHOS 6.2* 6.0* 5.8* 5.6* 5.8*  --   --    Liver Function Tests:  Recent Labs Lab 11/09/16 0719  11/10/16 0514 11/10/16 1118 11/11/16 0238 11/12/16 0903 11/13/16 0505  AST 18  --   --   --   --   --  13*  ALT 15  --   --   --   --   --  12*  ALKPHOS 62  --   --   --   --   --  79  BILITOT 0.7  --   --   --   --   --  0.5  PROT 7.1  --   --   --   --   --  6.3*  ALBUMIN 3.1*  < > 3.2* 3.0* 2.9* 2.8* 2.9*  < > = values in this interval not displayed. Coagulation Profile:  Recent Labs Lab 11/09/16 0719  INR 1.01   CBG:  Recent Labs Lab 11/13/16 2221 11/14/16  9629 11/14/16 0841 11/14/16 1215 11/14/16 1640  GLUCAP 338* 263* 176* 345* 453*    Recent Results (from the past 240 hour(s))  Culture, blood (routine x 2)     Status: None   Collection Time: 11/09/16 12:25 AM  Result Value Ref Range Status   Specimen Description BLOOD BLOOD LEFT HAND  Final   Special Requests IN PEDIATRIC BOTTLE Blood Culture adequate volume  Final   Culture   Final    NO GROWTH 5 DAYS Performed at [facility], [place] [address] [place], [place] [address]    Report Status 11/14/2016 FINAL  Final  Culture, blood (routine x 2)     Status: None   Collection Time: 11/09/16 12:25 AM  Result Value Ref Range Status   Specimen Description BLOOD LEFT ANTECUBITAL  Final   Special Requests IN PEDIATRIC BOTTLE Blood Culture adequate volume  Final   Culture   Final    NO GROWTH 5 DAYS Performed at [facility], [place] [address] [place], [place] [address]    Report Status 11/14/2016 FINAL  Final  Urine culture     Status: Abnormal   Collection Time: 11/09/16  1:10 AM  Result Value Ref Range Status   Specimen Description URINE, RANDOM  Final   Special Requests NONE  Final   Culture (A)  Final    <10,000 COLONIES/mL INSIGNIFICANT  GROWTH Performed at [facility] [address], [place], [place] [address]    Report Status 11/10/2016 FINAL  Final  C difficile quick scan w PCR reflex     Status: None   Collection Time: 11/10/16 12:55 AM  Result Value Ref Range Status   C Diff antigen NEGATIVE NEGATIVE Final   C Diff toxin NEGATIVE NEGATIVE Final   C Diff interpretation No C. difficile detected.  Final    Radiology Studies: No results found.  Scheduled Meds: . amLODipine  5 mg Oral Daily  . aspirin  81 mg Oral Daily  . atorvastatin  20 mg Oral q1800  . famotidine  20 mg Oral Daily  . feeding supplement  1 Container Oral TID BM  . heparin  5,000 Units Subcutaneous Q8H  . hydrALAZINE  25 mg Oral Q6H  . insulin aspart  0-9 Units Subcutaneous TID WC  . metoprolol tartrate  100 mg Oral BID  . pantoprazole  40 mg Oral Daily  . polyethylene glycol  17 g Oral Daily  . saccharomyces boulardii  250 mg Oral BID  . sodium bicarbonate  650 mg Oral BID  . sodium chloride flush  3 mL Intravenous Q12H  . triamcinolone cream  1 application Topical TID   Continuous Infusions: . sodium chloride 75 mL/hr at 11/14/16 0132     LOS: 5 days     Verneita Griffes, MD [facility] (P) [phone]   If 7PM-7AM, please contact night-coverage [email or web address] Password [facility] 11/14/2016, 5:39 PM

## 2016-11-18 NOTE — Progress Notes (Signed)
CRITICAL VALUE ALERT  Critical Value:  Calcium-6.1  Date & Time Notied:  11/18/16 @ 9:15  Provider Notified: Dr. Verlon Au  Orders Received/Actions taken: No new orders received.

## 2016-11-18 NOTE — Progress Notes (Signed)
CRITICAL VALUE ALERT  Critical Value: Calcium-6.0  Date & Time Notied: 11/18/16 [place]  Provider Notified: Dr. Verlon Au  Orders Received/Actions taken: No new orders received.

## 2016-11-18 NOTE — Progress Notes (Addendum)
Dr. Verlon Au notified of BMP results from 1700 draw; per MD, continue insulin gtt & space BMP draws to q6 hrs.  Verbal order for Full liquid diet; per MD, 2 consecutive WNL Anion Gaps needed before transitioning off Insulin gtt.  Patient updated and verbalizes understanding.  Pt does state she is feeling better at this time.  Will continue to monitor.

## 2016-11-19 DIAGNOSIS — E1110 Type 2 diabetes mellitus with ketoacidosis without coma: Secondary | ICD-10-CM

## 2016-11-19 LAB — GLUCOSE, CAPILLARY
GLUCOSE-CAPILLARY: 80 mg/dL (ref 65–99)
GLUCOSE-CAPILLARY: 131 mg/dL — AB (ref 65–99)
GLUCOSE-CAPILLARY: 65 mg/dL (ref 65–99)
GLUCOSE-CAPILLARY: 127 mg/dL — AB (ref 65–99)
GLUCOSE-CAPILLARY: 166 mg/dL — AB (ref 65–99)
GLUCOSE-CAPILLARY: 166 mg/dL — AB (ref 65–99)
Glucose-Capillary: 183 mg/dL — ABNORMAL HIGH (ref 65–99)
Glucose-Capillary: 200 mg/dL — ABNORMAL HIGH (ref 65–99)
Glucose-Capillary: 132 mg/dL — ABNORMAL HIGH (ref 65–99)
Glucose-Capillary: 115 mg/dL — ABNORMAL HIGH (ref 65–99)
Glucose-Capillary: 230 mg/dL — ABNORMAL HIGH (ref 65–99)
Glucose-Capillary: 265 mg/dL — ABNORMAL HIGH (ref 65–99)
Glucose-Capillary: 399 mg/dL — ABNORMAL HIGH (ref 65–99)
Glucose-Capillary: 241 mg/dL — ABNORMAL HIGH (ref 65–99)

## 2016-11-19 LAB — BPAM RBC
Blood Product Expiration Date: 201806072359
Blood Product Expiration Date: 201806072359
ISSUE DATE / TIME: 201805291133
ISSUE DATE / TIME: 201805291811
Unit Type and Rh: 600
Unit Type and Rh: 600

## 2016-11-19 LAB — TYPE AND SCREEN
ABO/RH(D): A NEG
Antibody Screen: NEGATIVE
Unit division: 0
Unit division: 0

## 2016-11-19 LAB — BASIC METABOLIC PANEL
ANION GAP: 16 — AB (ref 5–15)
ANION GAP: 18 — AB (ref 5–15)
BUN: 43 mg/dL — ABNORMAL HIGH (ref 6–20)
BUN: 43 mg/dL — ABNORMAL HIGH (ref 6–20)
CALCIUM: 5.8 mg/dL — AB (ref 8.9–10.3)
CALCIUM: 5.8 mg/dL — AB (ref 8.9–10.3)
CHLORIDE: 99 mmol/L — AB (ref 101–111)
CHLORIDE: 99 mmol/L — AB (ref 101–111)
CO2: 22 mmol/L (ref 22–32)
CO2: 25 mmol/L (ref 22–32)
Creatinine, Ser: 4.52 mg/dL — ABNORMAL HIGH (ref 0.44–1.00)
Creatinine, Ser: 4.43 mg/dL — ABNORMAL HIGH (ref 0.44–1.00)
GFR calc Af Amer: 12 mL/min — ABNORMAL LOW (ref >60)
GFR calc Af Amer: 12 mL/min — ABNORMAL LOW (ref >60)
GFR calc non Af Amer: 10 mL/min — ABNORMAL LOW (ref >60)
GFR calc non Af Amer: 10 mL/min — ABNORMAL LOW (ref >60)
GLUCOSE: 133 mg/dL — AB (ref 65–99)
GLUCOSE: 216 mg/dL — AB (ref 65–99)
Potassium: 3.2 mmol/L — ABNORMAL LOW (ref 3.5–5.1)
Potassium: 3.4 mmol/L — ABNORMAL LOW (ref 3.5–5.1)
Sodium: 139 mmol/L (ref 135–145)
Sodium: 140 mmol/L (ref 135–145)

## 2016-11-19 LAB — HEMOGLOBIN AND HEMATOCRIT, BLOOD
HCT: 31.1 % — ABNORMAL LOW (ref 36.0–46.0)
Hemoglobin: 10.4 g/dL — ABNORMAL LOW (ref 12.0–15.0)

## 2016-11-19 MED ORDER — DEXTROSE 50 % IV SOLN
INTRAVENOUS | Status: AC
  Administered 2016-11-19: 50 mL
  Filled 2016-11-19: qty 50

## 2016-11-19 MED ORDER — INSULIN GLARGINE 100 UNIT/ML ~~LOC~~ SOLN
18.0000 [IU] | Freq: Every day | SUBCUTANEOUS | Status: DC
  Administered 2016-11-19: 18 [IU] via SUBCUTANEOUS
  Filled 2016-11-19 (×2): qty 0.18

## 2016-11-19 MED ORDER — POTASSIUM CHLORIDE CRYS ER 20 MEQ PO TBCR
20.0000 meq | EXTENDED_RELEASE_TABLET | Freq: Two times a day (BID) | ORAL | Status: AC
Start: 2016-11-19 — End: 2016-11-20
  Administered 2016-11-19 – 2016-11-20 (×3): 20 meq via ORAL
  Filled 2016-11-19 (×3): qty 1

## 2016-11-19 MED ORDER — CALCIUM CARBONATE ANTACID 500 MG PO CHEW
800.0000 mg | CHEWABLE_TABLET | Freq: Three times a day (TID) | ORAL | Status: DC
  Administered 2016-11-19 – 2016-11-21 (×6): 800 mg via ORAL
  Filled 2016-11-19 (×6): qty 4

## 2016-11-19 MED ORDER — INSULIN ASPART 100 UNIT/ML ~~LOC~~ SOLN
6.0000 [IU] | Freq: Three times a day (TID) | SUBCUTANEOUS | Status: DC
  Administered 2016-11-19: 6 [IU] via SUBCUTANEOUS

## 2016-11-19 MED ORDER — MUPIROCIN 2 % EX OINT
1.0000 "application " | TOPICAL_OINTMENT | Freq: Two times a day (BID) | CUTANEOUS | Status: DC
  Administered 2016-11-19 – 2016-11-21 (×5): 1 via NASAL
  Filled 2016-11-19 (×3): qty 22

## 2016-11-19 MED ORDER — INSULIN ASPART 100 UNIT/ML ~~LOC~~ SOLN
0.0000 [IU] | Freq: Three times a day (TID) | SUBCUTANEOUS | Status: DC
Start: 2016-11-19 — End: 2016-11-21
  Administered 2016-11-19: 9 [IU] via SUBCUTANEOUS
  Administered 2016-11-19: 3 [IU] via SUBCUTANEOUS
  Administered 2016-11-20: 1 [IU] via SUBCUTANEOUS
  Administered 2016-11-20 – 2016-11-21 (×2): 2 [IU] via SUBCUTANEOUS

## 2016-11-19 MED ORDER — FUROSEMIDE 10 MG/ML IJ SOLN
40.0000 mg | Freq: Once | INTRAMUSCULAR | Status: AC
  Administered 2016-11-19: 40 mg via INTRAVENOUS
  Filled 2016-11-19: qty 4

## 2016-11-19 MED ORDER — CHLORHEXIDINE GLUCONATE CLOTH 2 % EX PADS
6.0000 | MEDICATED_PAD | Freq: Every day | CUTANEOUS | Status: DC
  Administered 2016-11-20 – 2016-11-21 (×2): 6 via TOPICAL

## 2016-11-19 NOTE — Progress Notes (Signed)
PROGRESS NOTE        PATIENT DETAILS Name: [name] Michael Age: 56 y.o. Sex: female Date of Birth: [birth year] Admit Date: 11/08/2016 Admitting Physician Lily Kocher, MD [id number], Charlane Ferretti, MD  Brief Narrative: Patient is a 56 y.o. female with past medical history of DM-2, stage IV chronic kidney disease, hypertension, dyslipidemia, prior history of CVA recently hospitalized from 5/2-5/4 after she was treated with antimicrobial therapy for presumed community acquired pneumonia, she presented on 5/19 with diarrhea and abdominal pain, she was found to have acute on chronic kidney disease stage IV, hypercalcemia and hyponatremia. Nephrology was consulted, patient gradually improved with correction of her numerous electrolyte abnormalities, lately hospital course complicated by development of diabetic ketoacidosis. See below for further details.   Subjective: Feels better-no nausea or vomiting.  Assessment/Plan: Diabetic ketoacidosis: CBGs much better-mildly elevated anion gap-is likely probably due to underlying kidney disease. Stop insulin infusion and IV fluids-transition to 18 units of Lantus and SSI. Follow CBGs and adjust accordingly. Check A1c  Acute kidney injury on chronic kidney disease stage IV: Felt to be hemodynamically mediated from diarrhea and hypercalcemia. Renal function improving with supportive care, since slightly volume overloaded-all IV fluids has been discontinued. Nephrology following and directing care.  Hypercalcemia: Found on admission-managed with IV fluids, Lasix and calcitonin-this was felt  to be secondary to milk-alkali syndrome-this has resolved, and patient is now hypocalcemic-see below  Hyponatremia: Resolved, likely hypovolemic hyponatremia in the setting of diarrhea.  Anemia: Likely secondary to chronic kidney disease-reviewed prior nephrology documentation-not felt to be paraproteinemia. Received 2 units of PRBC on 5/29, hemoglobin  stable. Continue to follow periodically.   Hypokalemia: Replete and recheck tomorrow.  Hypocalcemia: Probably related to underlying chronic kidney disease-nephrology following in directing supplementation.  Acute metabolic encephalopathy: Likely secondary to acute kidney injury-resolved-she is awake and alert.  Asymptomatic bacteriuria: No role for antimicrobial therapy-urine culture on 5/20 showed insignificant growth.  Hypertension: Blood pressure control-continue amlodipine, hydralazine and metoprolol.  Dyslipidemia: Continue statin  Prior history of CVA: Currently stable-continue aspirin and statin.  GERD: Continue Pepcid  Recent history of right hydronephrosis and distal right ureteric stone: Resolved by CT scan done on admission.  Chronic deconditioning/debility: Continue ET services-back to SNF on discharge  Telemetry (independently reviewed): No arrhythmias  Morning labs/Imaging ordered: yes  DVT Prophylaxis: Prophylactic Heparin  Code Status: Full code  Family Communication: None at bedside  Disposition Plan: Remain inpatient- SNF on discharge-hopefully in the next 2-3 days  Antimicrobial agents: Anti-infectives    Start     Dose/Rate Route Frequency Ordered Stop   11/09/16 1000  ceFEPIme (MAXIPIME) 500 mg in dextrose 5 % 50 mL IVPB  Status:  Discontinued     500 mg 100 mL/hr over 30 Minutes Intravenous Every 24 hours 11/09/16 0431 11/10/16 1049   11/09/16 0230  piperacillin-tazobactam (ZOSYN) IVPB 2.25 g  Status:  Discontinued     2.25 g 100 mL/hr over 30 Minutes Intravenous Every 6 hours 11/09/16 0210 11/09/16 0359      Procedures: None  CONSULTS:  nephrology  Time spent: 25 minutes-Greater than 50% of this time was spent in counseling, explanation of diagnosis, planning of further management, and coordination of care.  MEDICATIONS: Scheduled Meds: . amLODipine  10 mg Oral Daily  . aspirin  81 mg Oral Daily  . atorvastatin  20 mg Oral q1800    .  calcium carbonate  800 mg of elemental calcium Oral TID  . famotidine  20 mg Oral Daily  . feeding supplement  1 Container Oral TID BM  . furosemide  40 mg Intravenous Once  . heparin  5,000 Units Subcutaneous Q8H  . hydrALAZINE  25 mg Oral Q6H  . insulin aspart  0-9 Units Subcutaneous TID WC  . insulin glargine  18 Units Subcutaneous Daily  . lidocaine  1 patch Transdermal Q24H  . magnesium oxide  400 mg Oral BID  . metoprolol tartrate  100 mg Oral BID  . pantoprazole  40 mg Oral Daily  . polyethylene glycol  17 g Oral Daily  . potassium chloride  20 mEq Oral BID  . saccharomyces boulardii  250 mg Oral BID  . sodium chloride flush  3 mL Intravenous Q12H  . triamcinolone cream  1 application Topical TID   Continuous Infusions: . insulin (NOVOLIN-R) infusion 4.7 Units/hr (11/19/16 0937)   PRN Meds:.acetaminophen **OR** acetaminophen, bisacodyl, hydrALAZINE, ondansetron **OR** ondansetron (ZOFRAN) IV   PHYSICAL EXAM: Vital signs: Vitals:   11/18/16 2206 11/19/16 0026 11/19/16 0500 11/19/16 1041  BP: 129/72 119/62 118/65 130/73  Pulse: 84 82 80 83  Resp:  (!) 21 17 (!) 22  Temp:  98.4 F (36.9 C) 98.7 F (37.1 C) 97.5 F (36.4 C)  TempSrc:  Oral Oral Oral  SpO2:  93% 90% 95%  Weight:   47.9 kg (105 lb 8 oz)   Height:       Filed Weights   11/17/16 0657 11/18/16 0352 11/19/16 0500  Weight: 67.4 kg (148 lb 9.6 oz) 45.9 kg (101 lb 1.6 oz) 47.9 kg (105 lb 8 oz)   Body mass index is 20.6 kg/m.   General appearance :Awake, alert, not in any distress. Speech Clear. Eyes:, pupils equally reactive to light and accomodation HEENT: Atraumatic and Normocephalic Neck: supple Resp:Good air entry bilaterally, no added sounds  CVS: S1 S2 regular GI: Bowel sounds present, Non tender and not distended with no gaurding, rigidity or rebound.No organomegaly Extremities: B/L Lower Ext shows + edema, both legs are warm to touch Neurology:  speech clear,Non focal, sensation is grossly  intact. Psychiatric: Normal judgment and insight. Alert and oriented x 3. Musculoskeletal:No digital cyanosis Skin:No Rash, warm and dry Wounds:N/A  I have personally reviewed following labs and imaging studies  LABORATORY DATA: CBC:  Recent Labs Lab 11/15/16 0342 11/16/16 0444 11/18/16 0121 11/18/16 0833 11/19/16 0029  WBC 12.1* 14.0* 14.5* 14.2*  --   NEUTROABS 9.0*  --   --   --   --   HGB 7.3* 7.2* 6.8* 6.8* 10.4*  HCT 21.3* 21.6* 20.1* 20.2* 31.1*  MCV 86.2 87.8 87.8 88.2  --   PLT 258 260 315 333  --     Basic Metabolic Panel:  Recent Labs Lab 11/15/16 0342 11/15/16 0852 11/16/16 0802  11/18/16 0121 11/18/16 0833 11/18/16 1704 11/19/16 0029 11/19/16 0742  NA 135  --  137  < > 143  142 139 140 139 140  K 2.7*  --  7.2*  < > 3.5  3.4* 3.1* 3.3* 3.2* 3.4*  CL 105  --  112*  < > 101  100* 96* 96* 99* 99*  CO2 16*  --  12*  < > 24  24 28 27 22 25   GLUCOSE 169*  --  268*  < > 121*  120* 158* 131* 216* 133*  BUN 51*  --  50*  < > 46*  46* 44* 44* 43* 43*  CREATININE 5.79*  --  5.54*  < > 5.09*  5.09* 4.79* 4.70* 4.52* 4.43*  CALCIUM 6.5*  --  6.3*  < > 6.0*  6.0* 6.1* 6.0* 5.8* 5.8*  MG  --  1.4*  --   --  1.7  --   --   --   --   PHOS 4.6  --  5.1*  --  6.0*  --   --   --   --   < > = values in this interval not displayed.  GFR: Estimated Creatinine Clearance: 10.2 mL/min (A) (by C-G formula based on SCr of 4.43 mg/dL (H)).  Liver Function Tests:  Recent Labs Lab 11/13/16 0505 11/15/16 0342 11/16/16 0802 11/18/16 0121  AST 13*  --   --   --   ALT 12*  --   --   --   ALKPHOS 79  --   --   --   BILITOT 0.5  --   --   --   PROT 6.3*  --   --   --   ALBUMIN 2.9* 2.7* 2.9* 2.4*   No results for input(s): LIPASE, AMYLASE in the last 168 hours. No results for input(s): AMMONIA in the last 168 hours.  Coagulation Profile: No results for input(s): INR, PROTIME in the last 168 hours.  Cardiac Enzymes: No results for input(s): CKTOTAL, CKMB,  CKMBINDEX, TROPONINI in the last 168 hours.  BNP (last 3 results) No results for input(s): PROBNP in the last 8760 hours.  HbA1C: No results for input(s): HGBA1C in the last 72 hours.  CBG:  Recent Labs Lab 11/19/16 0551 11/19/16 0700 11/19/16 0755 11/19/16 0926 11/19/16 1027  GLUCAP 131* 115* 127* 166* 166*    Lipid Profile: No results for input(s): CHOL, HDL, LDLCALC, TRIG, CHOLHDL, LDLDIRECT in the last 72 hours.  Thyroid Function Tests: No results for input(s): TSH, T4TOTAL, FREET4, T3FREE, THYROIDAB in the last 72 hours.  Anemia Panel:  Recent Labs  11/18/16 0833  VITAMINB12 630  FOLATE 45.9  FERRITIN 2,875*  TIBC 182*  IRON 116  RETICCTPCT 2.2    Urine analysis:    Component Value Date/Time   COLORURINE STRAW (A) 11/09/2016 0110   APPEARANCEUR HAZY (A) 11/09/2016 0110   LABSPEC 1.005 11/09/2016 0110   PHURINE 9.0 (H) 11/09/2016 0110   GLUCOSEU 50 (A) 11/09/2016 0110   HGBUR SMALL (A) 11/09/2016 0110   BILIRUBINUR NEGATIVE 11/09/2016 0110   BILIRUBINUR n 05/25/2012 1202   KETONESUR NEGATIVE 11/09/2016 0110   PROTEINUR NEGATIVE 11/09/2016 0110   UROBILINOGEN 0.2 05/25/2012 1202   UROBILINOGEN 0.2 03/29/2011 0131   NITRITE NEGATIVE 11/09/2016 0110   LEUKOCYTESUR LARGE (A) 11/09/2016 0110    Sepsis Labs: Lactic Acid, Venous    Component Value Date/Time   LATICACIDVEN 1.44 11/09/2016 0009    MICROBIOLOGY: Recent Results (from the past 240 hour(s))  C difficile quick scan w PCR reflex     Status: None   Collection Time: 11/10/16 12:55 AM  Result Value Ref Range Status   C Diff antigen NEGATIVE NEGATIVE Final   C Diff toxin NEGATIVE NEGATIVE Final   C Diff interpretation No C. difficile detected.  Final  MRSA PCR Screening     Status: Abnormal   Collection Time: 11/17/16  1:40 PM  Result Value Ref Range Status   MRSA by PCR POSITIVE (A) NEGATIVE Final    Comment:        The GeneXpert MRSA Assay (  FDA approved for NASAL specimens only), is  one component of a comprehensive MRSA colonization surveillance program. It is not intended to diagnose MRSA infection nor to guide or monitor treatment for MRSA infections. RESULT CALLED TO, READ BACK BY AND VERIFIED WITH: Gypsy Lore RN 15:20 11/17/16 ([email or web address])     RADIOLOGY STUDIES/RESULTS: Ct Abdomen Pelvis Wo Contrast  Result Date: 11/09/2016 CLINICAL DATA:  Vomiting and upper abdominal pain. EXAM: CT ABDOMEN AND PELVIS WITHOUT CONTRAST TECHNIQUE: Multidetector CT imaging of the abdomen and pelvis was performed following the standard protocol without IV contrast. COMPARISON:  CT 10/22/2016, additional priors FINDINGS: Lower chest: Motion artifact.  No pleural fluid or consolidation. Hepatobiliary: No focal hepatic lesion. Gallbladder is decompressed, no calcified stone. No biliary dilatation. Pancreas: A few chronic pancreatic head calcifications. No ductal dilatation or inflammation. Spleen: Normal in size without focal abnormality. Adrenals/Urinary Tract: Enlarged edematous appearing kidneys with perinephric edema, chronic finding unchanged from December 2017. Previous right hydronephrosis has resolved. Previous stone in the distal right ureter is no longer seen. No left hydronephrosis. Bilateral nonobstructing intrarenal calculi are again seen. Urinary bladder is physiologically distended without wall thickening. No adrenal nodule. Stomach/Bowel: Stomach distended with ingested contrast, no gastric wall thickening. No bowel obstruction or inflammation. Moderate diffuse stool burden throughout the colon. Air and enteric contrast in the proximal appendix, no evidence of appendicitis. Vascular/Lymphatic: Aortic and branch atherosclerosis. No adenopathy. Reproductive: Uterus and bilateral adnexa are unremarkable. Other: No free air or free fluid.  No intra-abdominal abscess. Musculoskeletal: There are no acute or suspicious osseous abnormalities. IMPRESSION: 1. Resolved right hydronephrosis and distal  right ureteric stone. 2. Enlarged edematous kidneys with perinephric edema, a chronic finding and unchanged over the past 6 months. Bilateral nonobstructing renal calculi. 3. No new abnormality. 4. Moderate stool burden suggesting constipation. Aortic atherosclerosis without aneurysm. Electronically Signed   By: Jeb Levering M.D.   On: 11/09/2016 01:19   Ct Abdomen Pelvis Wo Contrast  Result Date: 10/22/2016 CLINICAL DATA:  Abdominal pain, last bowel movement 4 days ago. EXAM: CT ABDOMEN AND PELVIS WITHOUT CONTRAST TECHNIQUE: Multidetector CT imaging of the abdomen and pelvis was performed following the standard protocol without IV contrast. COMPARISON:  None. FINDINGS: Lower chest: Bibasilar atelectasis. The visualized cardiac chambers are top-normal size without pericardial effusion. Hepatobiliary: Contracted with slightly elongated left hepatic lobe again seen draping over the spleen. No space-occupying mass given limitations of a noncontrast study. The gallbladder is nondistended. No pericholecystic fluid or wall thickening. No definite calculus is noted within the gallbladder. Pancreas: Unremarkable Spleen: Unremarkable Adrenals/Urinary Tract: Bilateral adrenal glands are normal. Chronic nephromegaly with perinephric fat stranding. Interval development of a moderate degree of right-sided hydroureteronephrosis secondary to a 5 mm calculus which has almost passed completely into the bladder, series 2, image 76. Nonobstructing left-sided renal calculi are noted. Stable pelvic phleboliths are otherwise noted within the pelvis bilaterally some which are vascular in etiology. Urinary bladder is physiologically distended. Stomach/Bowel: The stomach is contracted. There is normal small bowel rotation without dilatation. Normal appendix moderate degree of stool throughout large bowel consistent with constipation. Vascular/Lymphatic: Aortoiliac atherosclerosis. No aneurysm. Small retroperitoneal nonpathologic  sized lymph nodes. Reproductive: Uterus and bilateral adnexa are unremarkable. Other: No abdominal wall hernia or abnormality. No abdominopelvic ascites. Musculoskeletal: No acute or significant osseous findings. IMPRESSION: 1. Moderate right-sided hydroureteronephrosis secondary to a 5 mm spindle shaped calcification which has almost passed completely into the bladder. 2. Nonobstructing left-sided nephrolithiasis. 3. Chronic bilateral nephromegaly with perinephric fat stranding is again  noted and may represent chronic ongoing renal inflammation or genitourinary infection. 4. Increased colonic stool burden consistent with constipation. Electronically Signed   By: Ashley Royalty M.D.   On: 10/22/2016 21:06   Dg Chest 2 View  Result Date: 10/22/2016 CLINICAL DATA:  Cough and fever for 4 days.  Smoker. EXAM: CHEST  2 VIEW COMPARISON:  Chest CT June 26, 2016 FINDINGS: The heart size and mediastinal contours are within normal limits. Both lungs are clear. The visualized skeletal structures are unremarkable. IMPRESSION: Stable examination:  No acute cardiopulmonary process. Electronically Signed   By: Elon Alas M.D.   On: 10/22/2016 18:46   Dg Shoulder 1v Left  Result Date: 11/16/2016 CLINICAL DATA:  Rotator cuff arthropathy EXAM: LEFT SHOULDER - 1 VIEW COMPARISON:  None. FINDINGS: There is no evidence of fracture or dislocation. There is no evidence of arthropathy or other focal bone abnormality. Soft tissues are unremarkable. IMPRESSION: Negative. Electronically Signed   By: Kerby Moors M.D.   On: 11/16/2016 13:50   Dg Abd Acute W/chest  Result Date: 11/08/2016 CLINICAL DATA:  Abdominal pain and vomiting.  Somnolence. EXAM: DG ABDOMEN ACUTE W/ 1V CHEST COMPARISON:  10/22/2016 CT abdomen and pelvis FINDINGS: No bowel obstruction. Increased colonic stool burden noted from cecum through rectum. Faint linear calcification may correspond with the known right UVJ stone versus stool particle noted in the  right hemipelvis.Heart size and mediastinal contours are within normal limits. Minimal atelectasis at the lung bases. Faint opacity at the left costophrenic angle on the frontal view of the chest does not persist on additional imaging and may be due to overlap of the ribs and breast tissue. IMPRESSION: No acute cardiopulmonary disease. Increased colonic stool burden consistent with constipation. There is a linear tiny calcification in the right hemipelvis which could potentially represent the known right UVJ stone, vascular calcification or tiny focus of stool within overlying bowel. Electronically Signed   By: Ashley Royalty M.D.   On: 11/08/2016 22:21     LOS: 10 days   Oren Binet, MD  [facility] Pager:[phone]  If 7PM-7AM, please contact night-coverage [email or web address] Password [email or web address] 11/19/2016, 11:04 AM

## 2016-11-19 NOTE — Progress Notes (Signed)
Patient ID: [name] Michael, female   DOB: [birth year], 56 y.o.   MRN: [id number]   [facility] Progress Note   Assessment/ Plan:   1.Acute kidney injury on chronic kidney disease stage IV: Suspected to be hemodynamically mediated from hypercalcemia. Baseline creatinine is around 2.2 and labs this morning show slow but continued improvement of renal function without any acute electrolyte abnormalities. No acute indications for hemodialysis 2. Anion gap metabolic acidosis: Improved with insulin drip and fluids. Will monitor for rebound alkalemia after correction of DKA. 3. Hyperglycemia versus ketoacidosis: Improving well on insulin gtt and with fluids  4. Hyponatremia: Corrected with control of hyperglycemia/isotonic fluids.  5. Hypokalemia: Mildly hypokalemic after correction of hyperkalemia. Will supplement judiciously. 6. Anemia: Hemoglobin level improved status post PRBC transfusion. 7. Hypocalcemia: We'll adjust dose of oral supplementation.   Subjective:   Reports to be feeling somewhat more energetic after PRBC transfusion overnight. Complaining of some shortness of breath    Objective:   BP 118/65   Pulse 80   Temp 98.7 F (37.1 C) (Oral)   Resp 17   Ht 5' (1.524 m)   Wt 47.9 kg (105 lb 8 oz)   LMP 03/06/2013   SpO2 90%   BMI 20.60 kg/m   Intake/Output Summary (Last 24 hours) at 11/19/16 0905 Last data filed at 11/19/16 0500  Gross per 24 hour  Intake          1162.48 ml  Output             1675 ml  Net          -512.52 ml   Weight change: 1.996 kg (4 lb 6.4 oz)  Physical Exam: Gen: Appears fatigued resting in bed CVS: Pulse regular rhythm, normal rate, S1 and S2 normal Resp: Coarse breath sounds/rales over lower lung fields-improve somewhat with coughing Abd: Soft, flat, nontender Ext: Trace edema over extremities  Imaging: No results found. Labs: BMET  Recent Labs Lab 11/15/16 7564 11/16/16 0802  11/17/16 0535 11/17/16 1611 11/17/16 2000  11/18/16 0121 11/18/16 0833 11/18/16 1704 11/19/16 0029  NA 135 137  < > 133* 143 142 143  142 139 140 139  K 2.7* 7.2*  < > 7.0* 3.9 3.8 3.5  3.4* 3.1* 3.3* 3.2*  CL 105 112*  < > 107 106 103 101  100* 96* 96* 99*  CO2 16* 12*  < > 11* 18* 21* 24  24 28 27 22   GLUCOSE 169* 268*  < > 321* 212* 132* 121*  120* 158* 131* 216*  BUN 51* 50*  < > 56* 49* 48* 46*  46* 44* 44* 43*  CREATININE 5.79* 5.54*  < > 5.63* 5.44* 5.30* 5.09*  5.09* 4.79* 4.70* 4.52*  CALCIUM 6.5* 6.3*  < > 6.2* 6.5* 6.2* 6.0*  6.0* 6.1* 6.0* 5.8*  PHOS 4.6 5.1*  --   --   --   --  6.0*  --   --   --   < > = values in this interval not displayed. CBC  Recent Labs Lab 11/15/16 0342 11/16/16 0444 11/18/16 0121 11/18/16 0833 11/19/16 0029  WBC 12.1* 14.0* 14.5* 14.2*  --   NEUTROABS 9.0*  --   --   --   --   HGB 7.3* 7.2* 6.8* 6.8* 10.4*  HCT 21.3* 21.6* 20.1* 20.2* 31.1*  MCV 86.2 87.8 87.8 88.2  --   PLT 258 260 315 333  --    Medications:    .  amLODipine  10 mg Oral Daily  . aspirin  81 mg Oral Daily  . atorvastatin  20 mg Oral q1800  . calcium carbonate  1 tablet Oral BID  . famotidine  20 mg Oral Daily  . feeding supplement  1 Container Oral TID BM  . heparin  5,000 Units Subcutaneous Q8H  . hydrALAZINE  25 mg Oral Q6H  . lidocaine  1 patch Transdermal Q24H  . magnesium oxide  400 mg Oral BID  . metoprolol tartrate  100 mg Oral BID  . pantoprazole  40 mg Oral Daily  . polyethylene glycol  17 g Oral Daily  . saccharomyces boulardii  250 mg Oral BID  . sodium chloride flush  3 mL Intravenous Q12H  . triamcinolone cream  1 application Topical TID   Elmarie Shiley, MD 11/19/2016, 9:05 AM

## 2016-11-19 NOTE — Progress Notes (Signed)
Inpatient Diabetes Program Recommendations  AACE/ADA: New Consensus Statement on Inpatient Glycemic Control (2015)  Target Ranges:  Prepandial:   less than 140 mg/dL      Peak postprandial:   less than 180 mg/dL (1-2 hours)      Critically ill patients:  140 - 180 mg/dL   Lab Results  Component Value Date   GLUCAP 265 (H) 11/19/2016   HGBA1C 10.9 04/29/2016    Review of Glycemic Control  Inpatient Diabetes Program Recommendations: Noted pending A1c. Please consider ordering Novolog 3 units TID with meals for meal coverage if patient eats at least 50% of meals.  Thank you, Nani Gasser. Yashar Inclan, RN, MSN, CDE  Diabetes Coordinator Inpatient Glycemic Control Team Team Pager [phone] (8am-5pm) 11/19/2016 1:58 PM

## 2016-11-19 NOTE — Progress Notes (Signed)
Physical Therapy Treatment Patient Details Name: [name] Michael MRN: [id number] DOB: 1960/11/14 Today's Date: 11/19/2016    History of Present Illness [name] Michael is a 56 y.o. woman with a history of CVA, HTN, Type 2 diabetes, and CKD 4 (baseline creatinine around 2.2) who was admitted from 5/2 - 5/4 for management of sepsis secondary to CAP, AKI, and nephrolithiasis with right hydronephrosis.  Patient admitted with N&V and was found to be hypovolemic hyponatremia and hypercalcemia.    PT Comments    Pt making slow progress with mobility. She was able to perform bed mobility and transfers with less physical assistance this session. Pt ambulated on RA with SPO2 maintaining >90%. Pt would continue to benefit from skilled physical therapy services at this time while admitted and after d/c to address the below listed limitations in order to improve overall safety and independence with functional mobility.    Follow Up Recommendations  Home health PT     Equipment Recommendations  Other (comment) (youth RW)    Recommendations for Other Services       Precautions / Restrictions Precautions Precautions: Fall Restrictions Weight Bearing Restrictions: No    Mobility  Bed Mobility Overal bed mobility: Needs Assistance Bed Mobility: Supine to Sit;Sit to Supine     Supine to sit: Min guard Sit to supine: Min guard   General bed mobility comments: increased time and effort, use of bed rails, min guard for safety  Transfers Overall transfer level: Needs assistance Equipment used: Rolling walker (2 wheeled) Transfers: Sit to/from Stand Sit to Stand: Min guard         General transfer comment: increased time, good technique, min guard for safety  Ambulation/Gait Ambulation/Gait assistance: Min guard Ambulation Distance (Feet): 100 Feet Assistive device: Rolling walker (2 wheeled) Gait Pattern/deviations: Step-through pattern;Decreased stride length Gait velocity:  decreased Gait velocity interpretation: Below normal speed for age/gender General Gait Details: mild instability but no LOB or need for physical assistance, min guard for safety and line management. pt with reports of soreness in bilateral LEs with ambulation   Stairs            Wheelchair Mobility    Modified Rankin (Stroke Patients Only)       Balance Overall balance assessment: Needs assistance Sitting-balance support: No upper extremity supported;Feet supported Sitting balance-Leahy Scale: Fair     Standing balance support: During functional activity;Bilateral upper extremity supported Standing balance-Leahy Scale: Poor Standing balance comment: pt reliant on bilateral UEs on RW                            Cognition Arousal/Alertness: Awake/alert Behavior During Therapy: Flat affect Overall Cognitive Status: No family/caregiver present to determine baseline cognitive functioning Area of Impairment: Problem solving                             Problem Solving: Slow processing;Requires verbal cues;Decreased initiation;Difficulty sequencing        Exercises      General Comments        Pertinent Vitals/Pain Pain Assessment: No/denies pain    Home Living                      Prior Function            PT Goals (current goals can now be found in the care plan section) Acute Rehab PT Goals  PT Goal Formulation: With patient Time For Goal Achievement: 11/24/16 Potential to Achieve Goals: Good Progress towards PT goals: Progressing toward goals    Frequency    Min 3X/week      PT Plan Current plan remains appropriate    Co-evaluation              AM-PAC PT "6 Clicks" Daily Activity  Outcome Measure  Difficulty turning over in bed (including adjusting bedclothes, sheets and blankets)?: None Difficulty moving from lying on back to sitting on the side of the bed? : A Little Difficulty sitting down on and  standing up from a chair with arms (e.g., wheelchair, bedside commode, etc,.)?: Total Help needed moving to and from a bed to chair (including a wheelchair)?: A Little Help needed walking in hospital room?: A Little Help needed climbing 3-5 steps with a railing? : A Lot 6 Click Score: 16    End of Session Equipment Utilized During Treatment: Gait belt Activity Tolerance: Patient tolerated treatment well Patient left: in bed;with call bell/phone within reach;with bed alarm set Nurse Communication: Mobility status PT Visit Diagnosis: Unsteadiness on feet (R26.81);Muscle weakness (generalized) (M62.81);Other abnormalities of gait and mobility (R26.89)     Time: 5364-6803 PT Time Calculation (min) (ACUTE ONLY): 21 min  Charges:  $Gait Training: 8-22 mins                    G Codes:       [place], [place], [place] 11/19/2016, 1:04 PM

## 2016-11-20 DIAGNOSIS — E1129 Type 2 diabetes mellitus with other diabetic kidney complication: Secondary | ICD-10-CM

## 2016-11-20 DIAGNOSIS — E1165 Type 2 diabetes mellitus with hyperglycemia: Secondary | ICD-10-CM

## 2016-11-20 DIAGNOSIS — E876 Hypokalemia: Secondary | ICD-10-CM

## 2016-11-20 LAB — HEMOGLOBIN A1C
Hgb A1c MFr Bld: 8.2 % — ABNORMAL HIGH (ref 4.8–5.6)
Mean Plasma Glucose: 189 mg/dL

## 2016-11-20 LAB — BASIC METABOLIC PANEL
Anion gap: 17 — ABNORMAL HIGH (ref 5–15)
BUN: 46 mg/dL — ABNORMAL HIGH (ref 6–20)
CHLORIDE: 98 mmol/L — AB (ref 101–111)
CO2: 24 mmol/L (ref 22–32)
CREATININE: 4.29 mg/dL — AB (ref 0.44–1.00)
Calcium: 5.8 mg/dL — CL (ref 8.9–10.3)
GFR calc non Af Amer: 11 mL/min — ABNORMAL LOW (ref >60)
GFR, EST AFRICAN AMERICAN: 12 mL/min — AB (ref >60)
Glucose, Bld: 164 mg/dL — ABNORMAL HIGH (ref 65–99)
POTASSIUM: 3.3 mmol/L — AB (ref 3.5–5.1)
SODIUM: 139 mmol/L (ref 135–145)

## 2016-11-20 LAB — GLUCOSE, CAPILLARY
GLUCOSE-CAPILLARY: 128 mg/dL — AB (ref 65–99)
GLUCOSE-CAPILLARY: 170 mg/dL — AB (ref 65–99)
Glucose-Capillary: 119 mg/dL — ABNORMAL HIGH (ref 65–99)
Glucose-Capillary: 155 mg/dL — ABNORMAL HIGH (ref 65–99)

## 2016-11-20 LAB — MAGNESIUM: MAGNESIUM: 1.4 mg/dL — AB (ref 1.7–2.4)

## 2016-11-20 MED ORDER — POTASSIUM CHLORIDE CRYS ER 20 MEQ PO TBCR
40.0000 meq | EXTENDED_RELEASE_TABLET | Freq: Two times a day (BID) | ORAL | Status: DC
  Administered 2016-11-20 (×2): 40 meq via ORAL
  Filled 2016-11-20: qty 2

## 2016-11-20 MED ORDER — SODIUM CHLORIDE 0.9 % IV SOLN
2.0000 g | Freq: Once | INTRAVENOUS | Status: AC
  Administered 2016-11-20: 2 g via INTRAVENOUS
  Filled 2016-11-20: qty 20

## 2016-11-20 MED ORDER — INSULIN ASPART 100 UNIT/ML ~~LOC~~ SOLN
8.0000 [IU] | Freq: Three times a day (TID) | SUBCUTANEOUS | Status: DC
  Administered 2016-11-20 – 2016-11-21 (×5): 8 [IU] via SUBCUTANEOUS

## 2016-11-20 MED ORDER — INSULIN GLARGINE 100 UNIT/ML ~~LOC~~ SOLN
24.0000 [IU] | Freq: Every day | SUBCUTANEOUS | Status: DC
  Administered 2016-11-20 – 2016-11-21 (×2): 24 [IU] via SUBCUTANEOUS
  Filled 2016-11-20 (×2): qty 0.24

## 2016-11-20 NOTE — Progress Notes (Signed)
Hospitalist paged regarding  lab results; Ca level 5.8, k 3.3

## 2016-11-20 NOTE — Plan of Care (Signed)
Problem: Pain Managment: Goal: General experience of comfort will improve Outcome: Progressing Continue to promote comfort- reposition patient with pillows- heels off pressure- Patient reports feeling comfortable and free of pain.

## 2016-11-20 NOTE — Progress Notes (Signed)
CSW completed workup on patient. Patient is from [place] and PT recommends therapy. CSW called ALF and confirmed ALF uses [facility] for PT for residents. Nurse case manager to follow up with [facility] to arrange PT for patient once medically ready for discharge.  Estanislado Emms, [place] Social Worker [phone]

## 2016-11-20 NOTE — Progress Notes (Signed)
Patient ID: [name] Michael, female   DOB: Nov 17, 1960, 56 y.o.   MRN: [id number]  [facility] Progress Note   Assessment/ Plan:   1.Acute kidney injury on chronic kidney disease stage IV: Suspected to be hemodynamically mediated from hypercalcemia. Baseline creatinine is around 2.2-She continues to have sluggish renal recovery with marginal charted urine output on furosemide-continue this dose as she remains hypervolemic. No acute indications for hemodialysis 2. Diabetic ketoacidosis: Corrected with fluids/insulin-now back on subcutaneous insulin  3. Hyponatremia: Corrected with control of hyperglycemia/isotonic fluids.  4. Hypokalemia: She remains hypokalemic after initial correction of hyperkalemia-in part from recently restarted loop diuretic, continue oral supplementation. 5. Anemia: Hemoglobin level improved status post PRBC transfusion. 6. Hypocalcemia: Calcium levels lower in spite of increased oral supplementation-will give intravenous calcium gluconate 2 g and check 25-hydroxy vitamin D levels.   Subjective:   Planes of some left shoulder pain and stiffness. Still having some shortness of breath with modest activity    Objective:   BP 129/67 (BP Location: Left Arm)   Pulse 85   Temp 98.4 F (36.9 C) (Oral)   Resp (!) 24   Ht 5' (1.524 m)   Wt 47.3 kg (104 lb 3.2 oz)   LMP 03/06/2013   SpO2 97%   BMI 20.35 kg/m   Intake/Output Summary (Last 24 hours) at 11/20/16 0940 Last data filed at 11/20/16 0800  Gross per 24 hour  Intake              720 ml  Output              700 ml  Net               20 ml   Weight change: -0.59 kg (-1 lb 4.8 oz)  Physical Exam: Gen: Appears fatigued resting in bed CVS: Pulse regular rhythm, normal rate, S1 and S2 normal Resp: Coarse breath sounds/rales over lower lung fields-No rhonchi or rales Abd: Soft, flat, nontender Ext: Trace edema over extremities Musculoskeletal: Some tenderness noted over attempted abduction/abduction  of left arm  Imaging: No results found. Labs: BMET  Recent Labs Lab 11/15/16 2423 11/16/16 0802  11/17/16 2000 11/18/16 0121 11/18/16 5361 11/18/16 1704 11/19/16 0029 11/19/16 0742 11/20/16 0349  NA 135 137  < > 142 143  142 139 140 139 140 139  K 2.7* 7.2*  < > 3.8 3.5  3.4* 3.1* 3.3* 3.2* 3.4* 3.3*  CL 105 112*  < > 103 101  100* 96* 96* 99* 99* 98*  CO2 16* 12*  < > 21* 24  24 28 27 22 25 24   GLUCOSE 169* 268*  < > 132* 121*  120* 158* 131* 216* 133* 164*  BUN 51* 50*  < > 48* 46*  46* 44* 44* 43* 43* 46*  CREATININE 5.79* 5.54*  < > 5.30* 5.09*  5.09* 4.79* 4.70* 4.52* 4.43* 4.29*  CALCIUM 6.5* 6.3*  < > 6.2* 6.0*  6.0* 6.1* 6.0* 5.8* 5.8* 5.8*  PHOS 4.6 5.1*  --   --  6.0*  --   --   --   --   --   < > = values in this interval not displayed. CBC  Recent Labs Lab 11/15/16 0342 11/16/16 0444 11/18/16 0121 11/18/16 0833 11/19/16 0029  WBC 12.1* 14.0* 14.5* 14.2*  --   NEUTROABS 9.0*  --   --   --   --   HGB 7.3* 7.2* 6.8* 6.8* 10.4*  HCT 21.3* 21.6*  20.1* 20.2* 31.1*  MCV 86.2 87.8 87.8 88.2  --   PLT 258 260 315 333  --    Medications:    . amLODipine  10 mg Oral Daily  . aspirin  81 mg Oral Daily  . atorvastatin  20 mg Oral q1800  . calcium carbonate  800 mg of elemental calcium Oral TID  . Chlorhexidine Gluconate Cloth  6 each Topical Q0600  . famotidine  20 mg Oral Daily  . feeding supplement  1 Container Oral TID BM  . heparin  5,000 Units Subcutaneous Q8H  . hydrALAZINE  25 mg Oral Q6H  . insulin aspart  0-9 Units Subcutaneous TID WC  . insulin aspart  8 Units Subcutaneous TID WC  . insulin glargine  24 Units Subcutaneous Daily  . lidocaine  1 patch Transdermal Q24H  . magnesium oxide  400 mg Oral BID  . metoprolol tartrate  100 mg Oral BID  . mupirocin ointment  1 application Nasal BID  . pantoprazole  40 mg Oral Daily  . polyethylene glycol  17 g Oral Daily  . potassium chloride  40 mEq Oral BID  . saccharomyces boulardii  250 mg  Oral BID  . sodium chloride flush  3 mL Intravenous Q12H  . triamcinolone cream  1 application Topical TID   Elmarie Shiley, MD 11/20/2016, 9:40 AM

## 2016-11-20 NOTE — Progress Notes (Addendum)
PROGRESS NOTE        PATIENT DETAILS Name: [name] Michael Age: 56 y.o. Sex: female Date of Birth: 07-27-1960 Admit Date: 11/08/2016 Admitting Physician Lily Kocher, MD KDX:IPJA, Charlane Ferretti, MD  Brief Narrative: Patient is a 56 y.o. female with past medical history of DM-2, stage IV chronic kidney disease, hypertension, dyslipidemia, prior history of CVA recently hospitalized from 5/2-5/4 after she was treated with antimicrobial therapy for presumed community acquired pneumonia, she presented on 5/19 with diarrhea and abdominal pain, she was found to have acute on chronic kidney disease stage IV, hypercalcemia and hyponatremia. Nephrology was consulted, patient gradually improved with correction of her numerous electrolyte abnormalities, lately hospital course complicated by development of diabetic ketoacidosis. See below for further details.   Subjective: Nausea, vomiting or diarrhea. Lying comfortably in bed.  Assessment/Plan: Diabetic ketoacidosis: Resolved, transitioned from insulin infusion to subcutaneous insulin. See below.   Acute kidney injury on chronic kidney disease stage IV: Likely hemodynamically mediated from diarrhea and hypercalcemia. Renal function slowly improving with supportive care, nephrology following.  Hypercalcemia: Resolved-this was found on admission, this was managed with Lasix, calcitonin and IV fluids. 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 Patient now hypocalcemic-see below  Hyponatremia: Resolved, likely hypovolemic hyponatremia in the setting of diarrhea.  Mild volume overload: In the setting of fluid resuscitation and renal failure-do not think she has congestive heart failure-continue IV Lasix. Volume status slowly improving  Anemia: Likely secondary to underlying chronic kidney disease-probably worsened due to acute illness. Reviewed prior nephrology documentation-not felt to be paraproteinemia. Patient is status post 2  units of PRBC on 5/29, hemoglobin stable at 10.4-continue to follow periodically.   Insulin dependent DM2: CBG's on the higher side, have increased Lantus to 24 units, and Novolog to 8 units with meals, A1C 8.2  Hypokalemia: Continue to replete, recheck electrolytes tomorrow   Hypocalcemia: Probably related to underlying chronic kidney disease-IV calcium being supplemented today-check vitamin D levels. Follow electrolytes  Acute metabolic encephalopathy: Resolved-likely secondary to acute kidney injury and hypercalcemia.   Asymptomatic bacteriuria: No role for antimicrobial therapy-urine culture on 5/20 showed insignificant growth.  Hypertension: Blood pressure control-continue amlodipine, hydralazine and metoprolol.  Dyslipidemia: Continue statin  Prior history of CVA: Currently stable-continue aspirin and statin.  GERD: Continue Pepcid  Recent history of right hydronephrosis and distal right ureteric stone: Resolved by CT scan done on admission.  Chronic deconditioning/debility: Continue PT services-back to SNF on discharge  Telemetry (independently reviewed): No arrhythmias  Morning labs/Imaging ordered: Yes  DVT Prophylaxis: Prophylactic Heparin  Code Status: Full code  Family Communication: None at bedside  Disposition Plan: Remain inpatient- SNF on discharge-hopefully in the next 2 days   Antimicrobial agents: Anti-infectives    Start     Dose/Rate Route Frequency Ordered Stop   11/09/16 1000  ceFEPIme (MAXIPIME) 500 mg in dextrose 5 % 50 mL IVPB  Status:  Discontinued     500 mg 100 mL/hr over 30 Minutes Intravenous Every 24 hours 11/09/16 0431 11/10/16 1049   11/09/16 0230  piperacillin-tazobactam (ZOSYN) IVPB 2.25 g  Status:  Discontinued     2.25 g 100 mL/hr over 30 Minutes Intravenous Every 6 hours 11/09/16 0210 11/09/16 0359      Procedures: None  CONSULTS:  nephrology  Time spent: 25 minutes-Greater than 50% of this time was spent in counseling,  explanation of diagnosis,  planning of further management, and coordination of care.  MEDICATIONS: Scheduled Meds: . amLODipine  10 mg Oral Daily  . aspirin  81 mg Oral Daily  . atorvastatin  20 mg Oral q1800  . calcium carbonate  800 mg of elemental calcium Oral TID  . Chlorhexidine Gluconate Cloth  6 each Topical Q0600  . famotidine  20 mg Oral Daily  . feeding supplement  1 Container Oral TID BM  . heparin  5,000 Units Subcutaneous Q8H  . hydrALAZINE  25 mg Oral Q6H  . insulin aspart  0-9 Units Subcutaneous TID WC  . insulin aspart  8 Units Subcutaneous TID WC  . insulin glargine  24 Units Subcutaneous Daily  . lidocaine  1 patch Transdermal Q24H  . magnesium oxide  400 mg Oral BID  . metoprolol tartrate  100 mg Oral BID  . mupirocin ointment  1 application Nasal BID  . pantoprazole  40 mg Oral Daily  . polyethylene glycol  17 g Oral Daily  . saccharomyces boulardii  250 mg Oral BID  . sodium chloride flush  3 mL Intravenous Q12H  . triamcinolone cream  1 application Topical TID   Continuous Infusions:  PRN Meds:.acetaminophen **OR** acetaminophen, bisacodyl, hydrALAZINE, ondansetron **OR** ondansetron (ZOFRAN) IV   PHYSICAL EXAM: Vital signs: Vitals:   11/20/16 0051 11/20/16 0359 11/20/16 0400 11/20/16 0726  BP: 117/67 124/70 124/70 129/67  Pulse: 81 83 83 85  Resp: 18 (!) 23 (!) 26 (!) 24  Temp: 97.7 F (36.5 C) 97.9 F (36.6 C)  98.4 F (36.9 C)  TempSrc: Oral Oral  Oral  SpO2: 96% 98% 98% 97%  Weight:  47.3 kg (104 lb 3.2 oz)    Height:       Filed Weights   11/18/16 0352 11/19/16 0500 11/20/16 0359  Weight: 45.9 kg (101 lb 1.6 oz) 47.9 kg (105 lb 8 oz) 47.3 kg (104 lb 3.2 oz)   Body mass index is 20.35 kg/m.   General appearance :Awake, alert, not in any distress.  Eyes:, pupils equally reactive to light and accomodation,no scleral icterus. HEENT: Atraumatic and Normocephalic Neck: supple, no JVD. Resp:Good air entry bilaterally CVS: S1 S2 regular, no  murmurs.  GI: Bowel sounds present, Non tender and not distended with no gaurding, rigidity or rebound. Extremities: B/L Lower Ext shows trace edema, both legs are warm to touch Neurology:  speech clear,Non focal, sensation is grossly intact. Psychiatric: Normal judgment and insight. Normal mood. Musculoskeletal:No digital cyanosis Skin:No Rash, warm and dry Wounds:N/A  I have personally reviewed following labs and imaging studies  LABORATORY DATA: CBC:  Recent Labs Lab 11/15/16 0342 11/16/16 0444 11/18/16 0121 11/18/16 0833 11/19/16 0029  WBC 12.1* 14.0* 14.5* 14.2*  --   NEUTROABS 9.0*  --   --   --   --   HGB 7.3* 7.2* 6.8* 6.8* 10.4*  HCT 21.3* 21.6* 20.1* 20.2* 31.1*  MCV 86.2 87.8 87.8 88.2  --   PLT 258 260 315 333  --     Basic Metabolic Panel:  Recent Labs Lab 11/15/16 0342 11/15/16 9811 11/16/16 0802  11/18/16 0121 11/18/16 9147 11/18/16 1704 11/19/16 0029 11/19/16 0742 11/20/16 0349  NA 135  --  137  < > 143  142 139 140 139 140 139  K 2.7*  --  7.2*  < > 3.5  3.4* 3.1* 3.3* 3.2* 3.4* 3.3*  CL 105  --  112*  < > 101  100* 96* 96* 99* 99* 98*  CO2 16*  --  12*  < > 24  24 28 27 22 25 24   GLUCOSE 169*  --  268*  < > 121*  120* 158* 131* 216* 133* 164*  BUN 51*  --  50*  < > 46*  46* 44* 44* 43* 43* 46*  CREATININE 5.79*  --  5.54*  < > 5.09*  5.09* 4.79* 4.70* 4.52* 4.43* 4.29*  CALCIUM 6.5*  --  6.3*  < > 6.0*  6.0* 6.1* 6.0* 5.8* 5.8* 5.8*  MG  --  1.4*  --   --  1.7  --   --   --   --   --   PHOS 4.6  --  5.1*  --  6.0*  --   --   --   --   --   < > = values in this interval not displayed.  GFR: Estimated Creatinine Clearance: 10.5 mL/min (A) (by C-G formula based on SCr of 4.29 mg/dL (H)).  Liver Function Tests:  Recent Labs Lab 11/15/16 0342 11/16/16 0802 11/18/16 0121  ALBUMIN 2.7* 2.9* 2.4*   No results for input(s): LIPASE, AMYLASE in the last 168 hours. No results for input(s): AMMONIA in the last 168 hours.  Coagulation  Profile: No results for input(s): INR, PROTIME in the last 168 hours.  Cardiac Enzymes: No results for input(s): CKTOTAL, CKMB, CKMBINDEX, TROPONINI in the last 168 hours.  BNP (last 3 results) No results for input(s): PROBNP in the last 8760 hours.  HbA1C:  Recent Labs  11/19/16 1132  HGBA1C 8.2*    CBG:  Recent Labs Lab 11/19/16 1121 11/19/16 1232 11/19/16 1640 11/19/16 2109 11/20/16 0726  GLUCAP 230* 265* 399* 241* 128*    Lipid Profile: No results for input(s): CHOL, HDL, LDLCALC, TRIG, CHOLHDL, LDLDIRECT in the last 72 hours.  Thyroid Function Tests: No results for input(s): TSH, T4TOTAL, FREET4, T3FREE, THYROIDAB in the last 72 hours.  Anemia Panel:  Recent Labs  11/18/16 0833  VITAMINB12 630  FOLATE 45.9  FERRITIN 2,875*  TIBC 182*  IRON 116  RETICCTPCT 2.2    Urine analysis:    Component Value Date/Time   COLORURINE STRAW (A) 11/09/2016 0110   APPEARANCEUR HAZY (A) 11/09/2016 0110   LABSPEC 1.005 11/09/2016 0110   PHURINE 9.0 (H) 11/09/2016 0110   GLUCOSEU 50 (A) 11/09/2016 0110   HGBUR SMALL (A) 11/09/2016 0110   BILIRUBINUR NEGATIVE 11/09/2016 0110   BILIRUBINUR n 05/25/2012 1202   KETONESUR NEGATIVE 11/09/2016 0110   PROTEINUR NEGATIVE 11/09/2016 0110   UROBILINOGEN 0.2 05/25/2012 1202   UROBILINOGEN 0.2 03/29/2011 0131   NITRITE NEGATIVE 11/09/2016 0110   LEUKOCYTESUR LARGE (A) 11/09/2016 0110    Sepsis Labs: Lactic Acid, Venous    Component Value Date/Time   LATICACIDVEN 1.44 11/09/2016 0009    MICROBIOLOGY: Recent Results (from the past 240 hour(s))  MRSA PCR Screening     Status: Abnormal   Collection Time: 11/17/16  1:40 PM  Result Value Ref Range Status   MRSA by PCR POSITIVE (A) NEGATIVE Final    Comment:        The GeneXpert MRSA Assay (FDA approved for NASAL specimens only), is one component of a comprehensive MRSA colonization surveillance program. It is not intended to diagnose MRSA infection nor to guide  or monitor treatment for MRSA infections. RESULT CALLED TO, READ BACK BY AND VERIFIED WITH: Gypsy Lore RN 15:20 11/17/16 ([email or web address])     RADIOLOGY STUDIES/RESULTS: Ct Abdomen Pelvis Wo Contrast  Result Date: 11/09/2016 CLINICAL DATA:  Vomiting and upper abdominal pain. EXAM: CT ABDOMEN AND PELVIS WITHOUT CONTRAST TECHNIQUE: Multidetector CT imaging of the abdomen and pelvis was performed following the standard protocol without IV contrast. COMPARISON:  CT 10/22/2016, additional priors FINDINGS: Lower chest: Motion artifact.  No pleural fluid or consolidation. Hepatobiliary: No focal hepatic lesion. Gallbladder is decompressed, no calcified stone. No biliary dilatation. Pancreas: A few chronic pancreatic head calcifications. No ductal dilatation or inflammation. Spleen: Normal in size without focal abnormality. Adrenals/Urinary Tract: Enlarged edematous appearing kidneys with perinephric edema, chronic finding unchanged from December 2017. Previous right hydronephrosis has resolved. Previous stone in the distal right ureter is no longer seen. No left hydronephrosis. Bilateral nonobstructing intrarenal calculi are again seen. Urinary bladder is physiologically distended without wall thickening. No adrenal nodule. Stomach/Bowel: Stomach distended with ingested contrast, no gastric wall thickening. No bowel obstruction or inflammation. Moderate diffuse stool burden throughout the colon. Air and enteric contrast in the proximal appendix, no evidence of appendicitis. Vascular/Lymphatic: Aortic and branch atherosclerosis. No adenopathy. Reproductive: Uterus and bilateral adnexa are unremarkable. Other: No free air or free fluid.  No intra-abdominal abscess. Musculoskeletal: There are no acute or suspicious osseous abnormalities. IMPRESSION: 1. Resolved right hydronephrosis and distal right ureteric stone. 2. Enlarged edematous kidneys with perinephric edema, a chronic finding and unchanged over the past 6 months.  Bilateral nonobstructing renal calculi. 3. No new abnormality. 4. Moderate stool burden suggesting constipation. Aortic atherosclerosis without aneurysm. Electronically Signed   By: Jeb Levering M.D.   On: 11/09/2016 01:19   Ct Abdomen Pelvis Wo Contrast  Result Date: 10/22/2016 CLINICAL DATA:  Abdominal pain, last bowel movement 4 days ago. EXAM: CT ABDOMEN AND PELVIS WITHOUT CONTRAST TECHNIQUE: Multidetector CT imaging of the abdomen and pelvis was performed following the standard protocol without IV contrast. COMPARISON:  None. FINDINGS: Lower chest: Bibasilar atelectasis. The visualized cardiac chambers are top-normal size without pericardial effusion. Hepatobiliary: Contracted with slightly elongated left hepatic lobe again seen draping over the spleen. No space-occupying mass given limitations of a noncontrast study. The gallbladder is nondistended. No pericholecystic fluid or wall thickening. No definite calculus is noted within the gallbladder. Pancreas: Unremarkable Spleen: Unremarkable Adrenals/Urinary Tract: Bilateral adrenal glands are normal. Chronic nephromegaly with perinephric fat stranding. Interval development of a moderate degree of right-sided hydroureteronephrosis secondary to a 5 mm calculus which has almost passed completely into the bladder, series 2, image 76. Nonobstructing left-sided renal calculi are noted. Stable pelvic phleboliths are otherwise noted within the pelvis bilaterally some which are vascular in etiology. Urinary bladder is physiologically distended. Stomach/Bowel: The stomach is contracted. There is normal small bowel rotation without dilatation. Normal appendix moderate degree of stool throughout large bowel consistent with constipation. Vascular/Lymphatic: Aortoiliac atherosclerosis. No aneurysm. Small retroperitoneal nonpathologic sized lymph nodes. Reproductive: Uterus and bilateral adnexa are unremarkable. Other: No abdominal wall hernia or abnormality. No  abdominopelvic ascites. Musculoskeletal: No acute or significant osseous findings. IMPRESSION: 1. Moderate right-sided hydroureteronephrosis secondary to a 5 mm spindle shaped calcification which has almost passed completely into the bladder. 2. Nonobstructing left-sided nephrolithiasis. 3. 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 4. Increased colonic stool burden consistent with constipation. Electronically Signed   By: Ashley Royalty M.D.   On: 10/22/2016 21:06   Dg Chest 2 View  Result Date: 10/22/2016 CLINICAL DATA:  Cough and fever for 4 days.  Smoker. EXAM: CHEST  2 VIEW COMPARISON:  Chest CT June 26, 2016 FINDINGS: The heart size and mediastinal contours are within normal limits. Both lungs are clear. The visualized skeletal structures are unremarkable. IMPRESSION: Stable examination:  No acute cardiopulmonary process. Electronically Signed   By: Elon Alas M.D.   On: 10/22/2016 18:46   Dg Shoulder 1v Left  Result Date: 11/16/2016 CLINICAL DATA:  Rotator cuff arthropathy EXAM: LEFT SHOULDER - 1 VIEW COMPARISON:  None. FINDINGS: There is no evidence of fracture or dislocation. There is no evidence of arthropathy or other focal bone abnormality. Soft tissues are unremarkable. IMPRESSION: Negative. Electronically Signed   By: Kerby Moors M.D.   On: 11/16/2016 13:50   Dg Abd Acute W/chest  Result Date: 11/08/2016 CLINICAL DATA:  Abdominal pain and vomiting.  Somnolence. EXAM: DG ABDOMEN ACUTE W/ 1V CHEST COMPARISON:  10/22/2016 CT abdomen and pelvis FINDINGS: No bowel obstruction. Increased colonic stool burden noted from cecum through rectum. Faint linear calcification may correspond with the known right UVJ stone versus stool particle noted in the right hemipelvis.Heart size and mediastinal contours are within normal limits. Minimal atelectasis at the lung bases. Faint opacity  at the left costophrenic angle on the frontal view of the chest does not persist on additional imaging and may be due to overlap of the ribs and breast tissue. IMPRESSION: No acute cardiopulmonary disease. Increased colonic stool burden consistent with constipation. There is a linear tiny calcification in the right hemipelvis which could potentially represent the known right UVJ stone, vascular calcification or tiny focus of stool within overlying bowel. Electronically Signed   By: Ashley Royalty M.D.   On: 11/08/2016 22:21     LOS: 11 days   Oren Binet, MD  [facility] Pager:[phone]  If 7PM-7AM, please contact night-coverage [email or web address] Password [email or web address] 11/20/2016, 9:07 AM

## 2016-11-20 NOTE — Care Management Note (Addendum)
Case Management Note  Patient Details  Name: [name] Michael MRN: [id number] Date of Birth: July 11, 1960  Subjective/Objective: Pt presented for n/v, hypercalcemia, abd pain, aki, hypovolemia, accelerated hypertension and uti.  Hx of DM, CVA and stage 4 chronic kidney disease. Pt is from [facility]. Pt was previously active with [facility] for [facility] [place]. Liaison Sarah to confirm if pt has PT Services.                    Action/Plan: Plan will be to return to [facility] once stable for d/c.[facility] to call back with confirmation of services-Orders in Epic. No further needs from CM at this time.    Expected Discharge Date:                  Expected Discharge Plan:  Assisted Living / Rest Home  In-House Referral:  Clinical Social Work  Discharge planning Services  CM Consult  Post Acute Care Choice:  Home Health, Resumption of Svcs/PTA Provider Choice offered to:  Patient  DME Arranged:  N/A DME Agency:  NA  HH Arranged:  RN, PT, OT HH Agency:  Other - See comment ([facility])  Status of Service:  Completed, signed off  If discussed at [place] of Stay Meetings, dates discussed:    Additional Comments:  Bethena Roys, RN 11/20/2016, 11:14 AM

## 2016-11-20 NOTE — [place] FL2 (Signed)
[place] MEDICAID FL2 LEVEL OF CARE SCREENING TOOL     IDENTIFICATION  Patient Name: [name] Michael Birthdate: [birth year] Sex: female Admission Date (Current Location): 11/08/2016  [facility] and [place] Number:  Herbalist and Address:  The . [facility], [place] [address], [place], [place] [address]      Provider Number: [id number]  Attending Physician Name and Address:  Jonetta Osgood, MD  Relative Name and Phone Number:  Wynelle Link [phone]    Current Level of Care: Hospital Recommended Level of Care: [place] Prior Approval Number:    Date Approved/Denied:   PASRR Number:    Discharge Plan: Other (Comment) (ALF, [facility])    Current Diagnoses: Patient Active Problem List   Diagnosis Date Noted  . AKI (acute kidney injury) ([place]) 11/09/2016  . Hypercalcemia 11/09/2016  . Hyponatremia 11/09/2016  . Hypovolemia 11/09/2016  . Accelerated hypertension 11/09/2016  . Acute lower UTI 11/09/2016  . Nephrolithiasis 10/24/2016  . Constipation 10/22/2016  . CAP (community acquired pneumonia) 10/22/2016  . Hydronephrosis of right kidney 10/22/2016  . Metabolic acidosis 70/35/0093  . CVA (cerebral vascular accident) ([place]) 07/03/2016  . HAP (hospital-acquired pneumonia) 07/03/2016  . Sepsis secondary to UTI ([place])   . UTI due to extended-spectrum beta lactamase (ESBL) producing Escherichia coli   . Acute pyelonephritis   . Bacteremia due to Escherichia coli   . Colitis, indeterminate   . Uncontrolled type 2 diabetes mellitus with complication ([place])   . 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 associated with diabetes mellitus due to underlying condition ([place])   . Chronic diastolic CHF (congestive heart failure) ([place])   . Abdominal pain   . Colitis   . Hypophosphatemia 06/12/2016  . Hypokalemia 06/11/2016  . Hypocalcemia 06/11/2016  . Acute kidney injury superimposed on chronic kidney disease ([place])  06/11/2016  . Proliferative diabetic retinopathy ([place]) 04/29/2016  . Closed fracture of left distal radius 01/28/2016  . Poor social situation 03/09/2013  . Depression 03/01/2013  . Abnormal mammogram 12/20/2012  . Retinal detachment 11/17/2012  . 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 ([place]) 81/82/9937  . Hyperlipidemia 02/09/2007  . Essential hypertension 02/09/2007  . GERD 02/09/2007    Orientation RESPIRATION BLADDER Height & Weight     Self, Time, Situation, Place  O2 (nasal cannula 2.5L) Continent Weight: 104 lb 3.2 oz (47.3 kg) Height:  5' (152.4 cm)  BEHAVIORAL SYMPTOMS/MOOD NEUROLOGICAL BOWEL NUTRITION STATUS      Incontinent Diet (heart healthy/carb modified)  AMBULATORY STATUS COMMUNICATION OF NEEDS Skin   Limited Assist Verbally Skin abrasions                       Personal Care Assistance Level of Assistance  Dressing, Feeding, Bathing Bathing Assistance: Limited assistance Feeding assistance: Limited assistance Dressing Assistance: Limited assistance     Functional Limitations Info  Sight, Hearing, Speech Sight Info: Impaired Hearing Info: Adequate Speech Info: Adequate    SPECIAL CARE FACTORS FREQUENCY  PT (By licensed PT), OT (By licensed OT)     PT Frequency: 3x/week OT Frequency: 3x/week            Contractures Contractures Info: Not present    Additional Factors Info  Code Status, Allergies, Isolation Precautions, Insulin Sliding Scale Code Status Info: Full Allergies Info: Hydrocodone   Insulin Sliding Scale Info: insulin 3x/day Isolation Precautions Info: Contact precautions; MRSA     Current Medications (11/20/2016):  This is the current hospital active medication list Current Facility-Administered Medications  Medication Dose Route Frequency Provider Last Rate Last Dose  . acetaminophen (TYLENOL) tablet 650 mg  650 mg Oral Q6H PRN Lily Kocher, MD   650 mg at 11/20/16 1154   Or  . acetaminophen  (TYLENOL) suppository 650 mg  650 mg Rectal Q6H PRN Lily Kocher, MD      . amLODipine (NORVASC) tablet 10 mg  10 mg Oral Daily Nita Sells, MD   10 mg at 11/20/16 0842  . aspirin chewable tablet 81 mg  81 mg Oral Daily Lily Kocher, MD   81 mg at 11/20/16 1610  . atorvastatin (LIPITOR) tablet 20 mg  20 mg Oral q1800 Lily Kocher, MD   20 mg at 11/19/16 1710  . bisacodyl (DULCOLAX) suppository 10 mg  10 mg Rectal Daily PRN Modena Jansky, MD      . calcium carbonate (TUMS - dosed in mg elemental calcium) chewable tablet 800 mg of elemental calcium  800 mg of elemental calcium Oral TID Elmarie Shiley, MD   800 mg of elemental calcium at 11/20/16 0841  . Chlorhexidine Gluconate Cloth 2 % PADS 6 each  6 each Topical Q0600 Jonetta Osgood, MD   6 each at 11/20/16 0606  . famotidine (PEPCID) tablet 20 mg  20 mg Oral Daily Modena Jansky, MD   20 mg at 11/20/16 [phone]  . feeding supplement (BOOST / RESOURCE BREEZE) liquid 1 Container  1 Container Oral TID BM Modena Jansky, MD   1 Container at 11/20/16 1000  . heparin injection 5,000 Units  5,000 Units Subcutaneous Q8H Lily Kocher, MD   5,000 Units at 11/20/16 [phone]  . hydrALAZINE (APRESOLINE) injection 10 mg  10 mg Intravenous Q8H PRN Modena Jansky, MD   10 mg at 11/12/16 0313  . hydrALAZINE (APRESOLINE) tablet 25 mg  25 mg Oral Q6H Nita Sells, MD   25 mg at 11/20/16 0842  . insulin aspart (novoLOG) injection 0-9 Units  0-9 Units Subcutaneous TID WC Jonetta Osgood, MD   2 Units at 11/20/16 1155  . insulin aspart (novoLOG) injection 8 Units  8 Units Subcutaneous TID WC Jonetta Osgood, MD   8 Units at 11/20/16 1155  . insulin glargine (LANTUS) injection 24 Units  24 Units Subcutaneous Daily Jonetta Osgood, MD   24 Units at 11/20/16 [phone]  . lidocaine (LIDODERM) 5 % 1 patch  1 patch Transdermal Q24H Nita Sells, MD   1 patch at 11/19/16 1710  . magnesium oxide (MAG-OX) tablet 400 mg  400 mg Oral BID  Nita Sells, MD   400 mg at 11/20/16 0842  . metoprolol tartrate (LOPRESSOR) tablet 100 mg  100 mg Oral BID Lily Kocher, MD   100 mg at 11/20/16 0843  . mupirocin ointment (BACTROBAN) 2 % 1 application  1 application Nasal BID Jonetta Osgood, MD   1 application at 14/78/29 [phone]  . ondansetron (ZOFRAN) tablet 4 mg  4 mg Oral Q6H PRN Lily Kocher, MD       Or  . ondansetron [facility]) injection 4 mg  4 mg Intravenous Q6H PRN Lily Kocher, MD   4 mg at 11/12/16 0548  . pantoprazole (PROTONIX) EC tablet 40 mg  40 mg Oral Daily Lily Kocher, MD   40 mg at 11/20/16 0843  . polyethylene glycol (MIRALAX / GLYCOLAX) packet 17 g  17 g Oral Daily Hongalgi, Lenis Dickinson, MD   17 g at  11/20/16 0843  . potassium chloride SA (K-DUR,KLOR-CON) CR tablet 40 mEq  40 mEq Oral BID Elmarie Shiley, MD   40 mEq at 11/20/16 0926  . saccharomyces boulardii (FLORASTOR) capsule 250 mg  250 mg Oral BID Modena Jansky, MD   250 mg at 11/20/16 [phone]  . sodium chloride flush (NS) 0.9 % injection 3 mL  3 mL Intravenous Q12H Lily Kocher, MD   3 mL at 11/20/16 0844  . triamcinolone cream (KENALOG) 0.1 % 1 application  1 application Topical TID Lily Kocher, MD   1 application at 65/78/46 [phone]     Discharge Medications: Please see discharge summary for a list of discharge medications.  Relevant Imaging Results:  Relevant Lab Results:   Additional Information SSN: [id number]  Estanislado Emms, LCSW

## 2016-11-20 NOTE — Progress Notes (Signed)
Physical Therapy Treatment Patient Details Name: [name] Michael MRN: [id number] DOB: [birth year] Today's Date: 11/20/2016    History of Present Illness [name] Michael is a 56 y.o. woman with a history of CVA, HTN, Type 2 diabetes, and CKD 4 (baseline creatinine around 2.2) who was admitted from 5/2 - 5/4 for management of sepsis secondary to CAP, AKI, and nephrolithiasis with right hydronephrosis.  Patient admitted with N&V and was found to be hypovolemic hyponatremia and hypercalcemia.    PT Comments    Assisted pt from recliner to [facility] to void then back.  Assisted with amb a greater distance using youth RW.  Amb on 2 lts nasal and trial RA.  Sats decreased from 96% to 89%.  No true dyspnea noted.  Tolerated well.  Plans to return to home.   Follow Up Recommendations  Home health PT     Equipment Recommendations  Rolling walker with 5" wheels (youth)    Recommendations for Other Services       Precautions / Restrictions Precautions Precautions: Fall Precaution Comments: monitor sats Restrictions Weight Bearing Restrictions: No    Mobility  Bed Mobility Overal bed mobility: Needs Assistance Bed Mobility: Rolling;Sidelying to Sit Rolling: Modified independent (Device/Increase time) Sidelying to sit: Min assist;HOB elevated       General bed mobility comments: Pt OOB in recliner  Transfers Overall transfer level: Needs assistance Equipment used: Rolling walker (2 wheeled) Transfers: Sit to/from Stand Sit to Stand: Min guard;Supervision Stand pivot transfers: Supervision;Min guard       General transfer comment: 25% VC'sfor safety, cues for hand placement with stand to sit plus caution to lines  Ambulation/Gait Ambulation/Gait assistance: Min guard Ambulation Distance (Feet): 145 Feet Assistive device: Rolling walker (2 wheeled) Gait Pattern/deviations: Step-through pattern;Decreased stride length Gait velocity: decreased   General Gait Details: amb on 2 lts nasal  O2 sats 96%.  Trial RA sats decreased to 89%.  No dyspnea.  Tolerated well   Stairs            Wheelchair Mobility    Modified Rankin (Stroke Patients Only)       Balance Overall balance assessment: Needs assistance   Sitting balance-Leahy Scale: Fair       Standing balance-Leahy Scale: Fair Standing balance comment: pt able to stand without UE support for ADL                            Cognition Arousal/Alertness: Awake/alert Behavior During Therapy: WFL for tasks assessed/performed Overall Cognitive Status: Within Functional Limits for tasks assessed Area of Impairment: Problem solving                             Problem Solving: Slow processing;Requires verbal cues;Decreased initiation;Difficulty sequencing        Exercises      General Comments        Pertinent Vitals/Pain Pain Assessment: No/denies pain Faces Pain Scale: Hurts little more Pain Location: back Pain Descriptors / Indicators: Aching Pain Intervention(s): RN gave pain meds during session;Monitored during session;Repositioned    Home Living                      Prior Function            PT Goals (current goals can now be found in the care plan section) Acute Rehab PT Goals Patient Stated Goal: To get stronger  Progress towards PT goals: Progressing toward goals    Frequency    Min 3X/week      PT Plan Current plan remains appropriate    Co-evaluation              AM-PAC PT "6 Clicks" Daily Activity  Outcome Measure  Difficulty turning over in bed (including adjusting bedclothes, sheets and blankets)?: None Difficulty moving from lying on back to sitting on the side of the bed? : A Little Difficulty sitting down on and standing up from a chair with arms (e.g., wheelchair, bedside commode, etc,.)?: Total Help needed moving to and from a bed to chair (including a wheelchair)?: A Little Help needed walking in hospital room?: A  Little Help needed climbing 3-5 steps with a railing? : A Lot 6 Click Score: 16    End of Session Equipment Utilized During Treatment: Gait belt Activity Tolerance: Patient tolerated treatment well Patient left: in chair;with call bell/phone within reach   PT Visit Diagnosis: Unsteadiness on feet (R26.81);Muscle weakness (generalized) (M62.81);Other abnormalities of gait and mobility (R26.89)     Time: 1405-1430 PT Time Calculation (min) (ACUTE ONLY): 25 min  Charges:  $Gait Training: 8-22 mins $Therapeutic Activity: 8-22 mins                    G Codes:       Rica Koyanagi  PTA [facility]  Acute  Rehab Pager      [phone]

## 2016-11-20 NOTE — Progress Notes (Signed)
Occupational Therapy Treatment Patient Details Name: [name] Michael MRN: [id number] DOB: 04-Oct-1960 Today's Date: 11/20/2016    History of present illness [name] Michael is a 56 y.o. woman with a history of CVA, HTN, Type 2 diabetes, and CKD 4 (baseline creatinine around 2.2) who was admitted from 5/2 - 5/4 for management of sepsis secondary to CAP, AKI, and nephrolithiasis with right hydronephrosis.  Patient admitted with N&V and was found to be hypovolemic hyponatremia and hypercalcemia.   OT comments  Pt completed 2 grooming activities standing at sink and toileting and UB dressing with min assist. Progressing steadily.   Follow Up Recommendations  Home health OT    Equipment Recommendations  3 in 1 bedside commode (reports she has lost the bucket)    Recommendations for Other Services      Precautions / Restrictions Precautions Precautions: Fall       Mobility Bed Mobility Overal bed mobility: Needs Assistance Bed Mobility: Rolling;Sidelying to Sit Rolling: Modified independent (Device/Increase time) Sidelying to sit: Min assist;HOB elevated       General bed mobility comments: assist to raise trunk, increased time, use of rail  Transfers Overall transfer level: Needs assistance Equipment used: Rolling walker (2 wheeled) Transfers: Sit to/from Stand Sit to Stand: Min guard         General transfer comment: for safety, cues for hand placement with stand to sit    Balance Overall balance assessment: Needs assistance   Sitting balance-Leahy Scale: Fair       Standing balance-Leahy Scale: Fair Standing balance comment: pt able to stand without UE support for ADL                           ADL either performed or assessed with clinical judgement   ADL Overall ADL's : Needs assistance/impaired     Grooming: Brushing hair;Oral care;Standing;Supervision/safety           Upper Body Dressing : Minimal assistance;Sitting Upper Body Dressing  Details (indicate cue type and reason): pt with urinary incontinence, changed gown     Toilet Transfer: Min guard;Ambulation;RW   Toileting- Clothing Manipulation and Hygiene: Minimal assistance;Sit to/from stand       Functional mobility during ADLs: Min guard;Rolling walker General ADL Comments: Pt feeling much better today.     Vision       Perception     Praxis      Cognition Arousal/Alertness: Awake/alert Behavior During Therapy: Flat affect Overall Cognitive Status: No family/caregiver present to determine baseline cognitive functioning Area of Impairment: Problem solving                             Problem Solving: Slow processing;Requires verbal cues;Decreased initiation;Difficulty sequencing          Exercises     Shoulder Instructions       General Comments      Pertinent Vitals/ Pain       Pain Assessment: Faces Faces Pain Scale: Hurts little more Pain Location: back Pain Descriptors / Indicators: Aching Pain Intervention(s): RN gave pain meds during session;Monitored during session;Repositioned  Home Living                                          Prior Functioning/Environment  Frequency  Min 2X/week        Progress Toward Goals  OT Goals(current goals can now be found in the care plan section)  Progress towards OT goals: Progressing toward goals  Acute Rehab OT Goals Patient Stated Goal: To get stronger OT Goal Formulation: With patient Time For Goal Achievement: 12/02/16 Potential to Achieve Goals: Good  Plan Discharge plan remains appropriate    Co-evaluation                 AM-PAC PT "6 Clicks" Daily Activity     Outcome Measure   Help from another person eating meals?: None Help from another person taking care of personal grooming?: A Little Help from another person toileting, which includes using toliet, bedpan, or urinal?: A Little Help from another person bathing  (including washing, rinsing, drying)?: A Little Help from another person to put on and taking off regular upper body clothing?: A Little Help from another person to put on and taking off regular lower body clothing?: A Little 6 Click Score: 19    End of Session Equipment Utilized During Treatment: Gait belt;Rolling walker  OT Visit Diagnosis: Unsteadiness on feet (R26.81);Low vision, both eyes (H54.2);Muscle weakness (generalized) (M62.81)   Activity Tolerance Patient tolerated treatment well   Patient Left in chair;with call bell/phone within reach   Nurse Communication          Time: 2536-6440 OT Time Calculation (min): 24 min  Charges: OT General Charges $OT Visit: 1 Procedure OT Treatments $Self Care/Home Management : 23-37 mins  [name] 11/20/2016, 12:47 PM  [phone]

## 2016-11-21 DIAGNOSIS — E1010 Type 1 diabetes mellitus with ketoacidosis without coma: Secondary | ICD-10-CM

## 2016-11-21 LAB — VITAMIN D 25 HYDROXY (VIT D DEFICIENCY, FRACTURES): VIT D 25 HYDROXY: 28.7 ng/mL — AB (ref 30.0–100.0)

## 2016-11-21 LAB — RENAL FUNCTION PANEL
Albumin: 2.2 g/dL — ABNORMAL LOW (ref 3.5–5.0)
Anion gap: 13 (ref 5–15)
BUN: 53 mg/dL — ABNORMAL HIGH (ref 6–20)
CHLORIDE: 103 mmol/L (ref 101–111)
CO2: 23 mmol/L (ref 22–32)
CREATININE: 4.08 mg/dL — AB (ref 0.44–1.00)
Calcium: 7.4 mg/dL — ABNORMAL LOW (ref 8.9–10.3)
GFR calc Af Amer: 13 mL/min — ABNORMAL LOW (ref >60)
GFR calc non Af Amer: 11 mL/min — ABNORMAL LOW (ref >60)
Glucose, Bld: 96 mg/dL (ref 65–99)
POTASSIUM: 5.4 mmol/L — AB (ref 3.5–5.1)
Phosphorus: 4.7 mg/dL — ABNORMAL HIGH (ref 2.5–4.6)
Sodium: 139 mmol/L (ref 135–145)

## 2016-11-21 LAB — GLUCOSE, CAPILLARY
Glucose-Capillary: 89 mg/dL (ref 65–99)
Glucose-Capillary: 158 mg/dL — ABNORMAL HIGH (ref 65–99)

## 2016-11-21 MED ORDER — INSULIN ASPART 100 UNIT/ML ~~LOC~~ SOLN: SUBCUTANEOUS | 0 refills | Status: DC

## 2016-11-21 MED ORDER — MAGNESIUM OXIDE 400 (241.3 MG) MG PO TABS: 400.0000 mg | ORAL_TABLET | Freq: Two times a day (BID) | ORAL | 0 refills | Status: DC

## 2016-11-21 MED ORDER — INSULIN GLARGINE 100 UNIT/ML ~~LOC~~ SOLN: 24.0000 [IU] | Freq: Every day | SUBCUTANEOUS | 0 refills | Status: DC

## 2016-11-21 MED ORDER — VITAMIN D (ERGOCALCIFEROL) 1.25 MG (50000 UNIT) PO CAPS: 50000.0000 [IU] | ORAL_CAPSULE | ORAL | 0 refills | Status: DC

## 2016-11-21 MED ORDER — AMLODIPINE BESYLATE 10 MG PO TABS: 10.0000 mg | ORAL_TABLET | Freq: Every day | ORAL | 0 refills | Status: DC

## 2016-11-21 MED ORDER — BOOST / RESOURCE BREEZE PO LIQD: 1.0000 | Freq: Three times a day (TID) | ORAL | 0 refills | Status: DC

## 2016-11-21 MED ORDER — FUROSEMIDE 40 MG PO TABS: 40.0000 mg | ORAL_TABLET | Freq: Every day | ORAL | 0 refills | Status: DC

## 2016-11-21 MED ORDER — INSULIN ASPART 100 UNIT/ML ~~LOC~~ SOLN: 8.0000 [IU] | Freq: Three times a day (TID) | SUBCUTANEOUS | 0 refills | Status: DC

## 2016-11-21 MED ORDER — CALCIUM CARBONATE ANTACID 500 MG PO CHEW: 800.0000 mg | CHEWABLE_TABLET | Freq: Three times a day (TID) | ORAL | 0 refills | Status: DC

## 2016-11-21 MED ORDER — LIDOCAINE 5 % EX PTCH: 1.0000 | MEDICATED_PATCH | CUTANEOUS | 0 refills | Status: DC

## 2016-11-21 MED ORDER — FUROSEMIDE 40 MG PO TABS
40.0000 mg | ORAL_TABLET | Freq: Every day | ORAL | Status: DC
  Administered 2016-11-21: 40 mg via ORAL

## 2016-11-21 MED ORDER — HYDRALAZINE HCL 25 MG PO TABS: 25.0000 mg | ORAL_TABLET | Freq: Three times a day (TID) | ORAL | 0 refills | Status: DC

## 2016-11-21 MED ORDER — HEPARIN SODIUM (PORCINE) 5000 UNIT/ML IJ SOLN: 5000.0000 [IU] | Freq: Three times a day (TID) | INTRAMUSCULAR | Status: DC

## 2016-11-21 MED ORDER — POLYETHYLENE GLYCOL 3350 17 G PO PACK: 17.0000 g | PACK | Freq: Every day | ORAL | 0 refills | Status: DC

## 2016-11-21 NOTE — Progress Notes (Addendum)
Patient ID: [name] Michael, female   DOB: 1960-08-16, 56 y.o.   MRN: [id number]  [facility] Progress Note   Assessment/ Plan:   1.Acute kidney injury on chronic kidney disease stage IV: Suspected to be hemodynamically mediated from hypercalcemia. Baseline creatinine is around 2.2-Continues to have slow renal recovery. Recommend DC on lasix 40mg  daily with labs in 1 week. 2. Diabetic ketoacidosis: Corrected with fluids/insulin-now back on subcutaneous insulin  3. Hyponatremia: Corrected with control of hyperglycemia/isotonic fluids.  4. Hyperkalemia: from overcorrection--monitor on lasix. 5. Anemia: Hemoglobin level improved status post PRBC transfusion. 6. Hypocalcemia: Calcium levels improved on PO and IV Ca- DC on ergocalciferol 50000units  q week.   Subjective:   Reports to be feeling much better today--excited at possible DC   Objective:   BP 136/74   Pulse 82   Temp 97.8 F (36.6 C) (Oral)   Resp 15   Ht 5' (1.524 m)   Wt 46 kg (101 lb 6.4 oz)   LMP 03/06/2013   SpO2 98%   BMI 19.80 kg/m   Intake/Output Summary (Last 24 hours) at 11/21/16 0917 Last data filed at 11/21/16 0500  Gross per 24 hour  Intake              890 ml  Output              800 ml  Net               90 ml   Weight change: -1.27 kg (-2 lb 12.8 oz)  Physical Exam: Gen: Appears comfortable sitting on edge of bed CVS: Pulse regular rhythm, normal rate, S1 and S2 normal Resp: Coarse breath sounds/rales over lower lung fields-No rhonchi or rales Abd: Soft, flat, nontender Ext: Trace edema over extremities  Imaging: No results found. Labs: BMET  Recent Labs Lab 11/15/16 1829 11/16/16 0802  11/18/16 0121 11/18/16 9371 11/18/16 1704 11/19/16 0029 11/19/16 0742 11/20/16 0349 11/21/16 0539  NA 135 137  < > 143  142 139 140 139 140 139 139  K 2.7* 7.2*  < > 3.5  3.4* 3.1* 3.3* 3.2* 3.4* 3.3* 5.4*  CL 105 112*  < > 101  100* 96* 96* 99* 99* 98* 103  CO2 16* 12*  < > 24  24  28 27 22 25 24 23   GLUCOSE 169* 268*  < > 121*  120* 158* 131* 216* 133* 164* 96  BUN 51* 50*  < > 46*  46* 44* 44* 43* 43* 46* 53*  CREATININE 5.79* 5.54*  < > 5.09*  5.09* 4.79* 4.70* 4.52* 4.43* 4.29* 4.08*  CALCIUM 6.5* 6.3*  < > 6.0*  6.0* 6.1* 6.0* 5.8* 5.8* 5.8* 7.4*  PHOS 4.6 5.1*  --  6.0*  --   --   --   --   --  4.7*  < > = values in this interval not displayed. CBC  Recent Labs Lab 11/15/16 0342 11/16/16 0444 11/18/16 0121 11/18/16 0833 11/19/16 0029  WBC 12.1* 14.0* 14.5* 14.2*  --   NEUTROABS 9.0*  --   --   --   --   HGB 7.3* 7.2* 6.8* 6.8* 10.4*  HCT 21.3* 21.6* 20.1* 20.2* 31.1*  MCV 86.2 87.8 87.8 88.2  --   PLT 258 260 315 333  --    Medications:    . amLODipine  10 mg Oral Daily  . aspirin  81 mg Oral Daily  . atorvastatin  20 mg Oral q1800  .  calcium carbonate  800 mg of elemental calcium Oral TID  . Chlorhexidine Gluconate Cloth  6 each Topical Q0600  . famotidine  20 mg Oral Daily  . feeding supplement  1 Container Oral TID BM  . furosemide  40 mg Oral Daily  . heparin  5,000 Units Subcutaneous Q8H  . hydrALAZINE  25 mg Oral Q6H  . insulin aspart  0-9 Units Subcutaneous TID WC  . insulin aspart  8 Units Subcutaneous TID WC  . insulin glargine  24 Units Subcutaneous Daily  . lidocaine  1 patch Transdermal Q24H  . magnesium oxide  400 mg Oral BID  . metoprolol tartrate  100 mg Oral BID  . mupirocin ointment  1 application Nasal BID  . pantoprazole  40 mg Oral Daily  . polyethylene glycol  17 g Oral Daily  . saccharomyces boulardii  250 mg Oral BID  . sodium chloride flush  3 mL Intravenous Q12H  . triamcinolone cream  1 application Topical TID   Elmarie Shiley, MD 11/21/2016, 9:17 AM

## 2016-11-21 NOTE — Progress Notes (Signed)
Patient and transport personnel received all discharge information, education, and printed prescriptions. Report was given to RN at [place].

## 2016-11-21 NOTE — Discharge Summary (Signed)
PATIENT DETAILS Name: [name] Michael Age: 56 y.o. Sex: female Date of Birth: 10-03-1960 MRN: [id number]. Admitting Physician: Lily Kocher, MD PFX:[id number], Charlane Ferretti, MD  Admit Date: 11/08/2016 Discharge date: 11/21/2016  Recommendations for Outpatient Follow-up:  1. Follow up with PCP in 1-2 weeks 2. Please obtain BMP/CBC in one week 3. Please ensure follow-up with [place] [facility]-Dr. Florene Glen 4. May need further workup for ongoing left shoulder pain-better the time of discharge, if reoccurs-may need further imaging and orthopedic evaluation.  Admitted From:  ALF   Disposition: ALF with Home with home health services   Home Health: Yes  Equipment/Devices: None  Discharge Condition: Stable  CODE STATUS: FULL CODE  Diet recommendation:  Heart Healthy / Carb Modified  Brief Summary: See H&P, Labs, Consult and Test reports for all details in brief, Patient is a 56 y.o. female with past medical history of DM-2, stage IV chronic kidney disease, hypertension, dyslipidemia, prior history of CVA recently hospitalized from 5/2-5/4 after she was treated with antimicrobial therapy for presumed community acquired pneumonia, she presented on 5/19 with diarrhea and abdominal pain, she was found to have acute on chronic kidney disease stage IV, hypercalcemia and hyponatremia. Nephrology was consulted, patient gradually improved with correction of her numerous electrolyte abnormalities, hospital course was complicated by development of diabetic ketoacidosis. See below for further details.   Brief Hospital Course: Diabetic ketoacidosis: Resolved, transitioned from insulin infusion to subcutaneous insulin. See below.   Acute kidney injury on chronic kidney disease stage IV: Likely hemodynamically mediated from diarrhea and hypercalcemia. Renal function slowly improving with supportive care, nephrology following. Please ensure follow-up with primary nephrologist-Dr. Florene Glen in the next week or  so.  Hypercalcemia: Resolved-this was found on admission, this was managed with Lasix, calcitonin and IV fluids. 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 Patient now hypocalcemic-see below  Hyponatremia: Resolved, likely hypovolemic hyponatremia in the setting of diarrhea.  Mild volume overload: In the setting of fluid resuscitation and renal failure-do not think she has congestive heart failure-managed with Lasix-volume status has improved-plans are to transition to oral furosemide on discharge. Please continue to follow volume status closely.  Anemia: Likely secondary to underlying chronic kidney disease-probably worsened due to acute illness. Reviewed prior nephrology documentation-not felt to be paraproteinemia. Patient is status post 2 units of PRBC on 5/29, hemoglobin stable at 10.4-continue to follow closely while at SNF-suggest repeat CBC in 1 week  Insulin dependent DM2: CBG's much better controlled-continue Lantus 24 units and 8 units of NovoLog with meals. Note A1c is 8.2.   Hypokalemia:  repleted-now mildly hyperkalemic-discussed with Dr. Jeffie Pollock treatment indicated at this time-patient already on Lasix. Potassium level has been fluctuating over the past few days. As noted above, needs a repeat chemistry panel in 1 week.   Hypocalcemia: Probably related to underlying chronic kidney required IV calcium supplementation during this hospital stay-discussed with Dr. Posey Pronto today-recommendations are to discharge on current dosing of Tums in also start the patient on ergocalciferol weekly. Please follow electrolytes in 1 week  Acute metabolic encephalopathy: Resolved-likely secondary to acute kidney injury and hypercalcemia.   Left shoulder pain: Better this morning-x-ray on 5/27 negative. Continue supportive care-if this worsens, consider outpatient orthopedic evaluation. Note no overlying joint swelling/erythema evident on exam.  Asymptomatic bacteriuria: No role for  antimicrobial therapy-urine culture on 5/20 showed insignificant growth.  Hypertension: Blood pressure control-continue amlodipine, hydralazine and metoprolol.  Dyslipidemia: Continue statin   Prior history of CVA: Currently stable-continue aspirin and statin.  GERD: Continue Pepcid  Recent history of right hydronephrosis and distal right ureteric stone: Resolved by CT scan done on admission.  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 (HCC)   Abdominal pain   AKI (acute kidney injury) ([place])   Hyponatremia   Hypovolemia   Accelerated hypertension   Acute lower UTI   Discharge Instructions:  Activity:  As tolerated with Full fall precautions use walker/cane & assistance as needed   Discharge Instructions    Diet - low sodium heart healthy    Complete by:  As directed    Diet Carb Modified    Complete by:  As directed    Discharge instructions    Complete by:  As directed    Check CBGs before meals and at bedtime   Increase activity slowly    Complete by:  As directed      Allergies as of 11/21/2016      Reactions   Hydrocodone Nausea And Vomiting      Medication List    STOP taking these medications   calcium-vitamin D 500-200 MG-UNIT tablet Commonly known as:  OSCAL WITH D   insulin aspart protamine- aspart (70-30) 100 UNIT/ML injection Commonly known as:  NOVOLOG MIX 70/30     TAKE these medications   amLODipine 10 MG tablet Commonly known as:  NORVASC Take 1 tablet (10 mg total) by mouth daily. Start taking on:  11/22/2016   aspirin 81 MG tablet Take 1 tablet (81 mg total) by mouth daily.   atorvastatin 20 MG tablet Commonly known as:  LIPITOR Take 1 tablet (20 mg total) by mouth daily.   calcium carbonate 500  MG chewable tablet Commonly known as:  TUMS - dosed in mg elemental calcium Chew 4 tablets (800 mg of elemental calcium total) by mouth 3 (three) times daily.   CERTAVITE SENIOR/ANTIOXIDANT Tabs Take 1 tablet by mouth daily.   feeding supplement Liqd Take 1 Container by mouth 3 (three) times daily between meals.   furosemide 40 MG tablet Commonly known as:  LASIX Take 1 tablet (40 mg total) by mouth daily.   hydrALAZINE 25 MG tablet Commonly known as:  APRESOLINE Take 1 tablet (25 mg total) by mouth 3 (three) times daily.   insulin aspart 100 UNIT/ML injection Commonly known as:  novoLOG Inject 8 Units into the skin 3 (three) times daily with meals.   insulin glargine 100 UNIT/ML injection Commonly known as:  LANTUS Inject 0.24 mLs (24 Units total) into the skin daily. Start taking on:  11/22/2016   lidocaine 5 % Commonly known as:  LIDODERM Place 1 patch onto the skin daily. Remove & Discard patch within 12 hours or as directed by MD   linagliptin 5 MG Tabs tablet Commonly known as:  TRADJENTA Take 5 mg by mouth daily.   magnesium oxide 400 (241.3 Mg) MG tablet Commonly known as:  MAG-OX Take 1 tablet (400 mg total) by mouth 2 (two) times daily.   metoprolol tartrate 100 MG tablet Commonly known as:  LOPRESSOR Take 100 mg by mouth 2 (two) times daily.   nystatin powder Generic drug:  nystatin Apply 1 g topically 2 (two) times daily.   omeprazole 40 MG capsule Commonly known as:  PRILOSEC Take 40 mg by mouth daily.   ondansetron 4 MG disintegrating tablet Commonly known as:  ZOFRAN ODT Take 1 tablet (4 mg total) by mouth every 8 (eight) hours as needed for nausea or vomiting.   polyethylene glycol packet Commonly known as:  MIRALAX / GLYCOLAX Take 17 g by mouth daily. Start taking on:  11/22/2016   Probiotic 250 MG Caps Take 1 capsule by mouth 2 (two) times daily.   ranitidine 150 MG tablet Commonly known as:  ZANTAC Take 1 tablet (150 mg total) by mouth 2  (two) times daily.   sodium bicarbonate 650 MG tablet Take 1 tablet (650 mg total) by mouth 2 (two) times daily.   triamcinolone cream 0.1 % Commonly known as:  KENALOG Apply 1 application topically 3 (three) times daily. Apply to labia three time a day   Vitamin D (Ergocalciferol) 50000 units Caps capsule Commonly known as:  DRISDOL Take 1 capsule (50,000 Units total) by mouth every 7 (seven) days.      Follow-up Information    Arnoldo Morale, MD. Schedule an appointment as soon as possible for a visit in 1 week(s).   Specialty:  Family Medicine Contact information: [place] [address] [phone]        Estanislado Emms, MD. Schedule an appointment as soon as possible for a visit in 1 week(s).   Specialty:  Nephrology Contact information: [address]                          [place] [address] [phone]          Allergies  Allergen Reactions  . Hydrocodone Nausea And Vomiting   Consultations:   nephrology  Other Procedures/Studies: Ct Abdomen Pelvis Wo Contrast  Result Date: 11/09/2016 CLINICAL DATA:  Vomiting and upper abdominal pain. EXAM: CT ABDOMEN AND PELVIS WITHOUT CONTRAST TECHNIQUE: Multidetector CT imaging of the abdomen and pelvis was performed following the standard protocol without IV contrast. COMPARISON:  CT 10/22/2016, additional priors FINDINGS: Lower chest: Motion artifact.  No pleural fluid or consolidation. Hepatobiliary: No focal hepatic lesion. Gallbladder is decompressed, no calcified stone. No biliary dilatation. Pancreas: A few chronic pancreatic head calcifications. No ductal dilatation or inflammation. Spleen: Normal in size without focal abnormality. Adrenals/Urinary Tract: Enlarged edematous appearing kidneys with perinephric edema, chronic finding unchanged from December 2017. Previous right hydronephrosis has resolved. Previous stone in the distal right ureter is no longer seen. No left hydronephrosis.  Bilateral nonobstructing intrarenal calculi are again seen. Urinary bladder is physiologically distended without wall thickening. No adrenal nodule. Stomach/Bowel: Stomach distended with ingested contrast, no gastric wall thickening. No bowel obstruction or inflammation. Moderate diffuse stool burden throughout the colon. Air and enteric contrast in the proximal appendix, no evidence of appendicitis. Vascular/Lymphatic: Aortic and branch atherosclerosis. No adenopathy. Reproductive: Uterus and bilateral adnexa are unremarkable. Other: No free air or free fluid.  No intra-abdominal abscess. Musculoskeletal: There are no acute or suspicious osseous abnormalities. IMPRESSION: 1. Resolved right hydronephrosis and distal right ureteric stone. 2. Enlarged edematous kidneys with perinephric edema, a chronic finding and unchanged over the past 6 months. Bilateral nonobstructing renal calculi. 3. No new abnormality. 4. Moderate stool burden suggesting constipation. Aortic atherosclerosis without aneurysm. Electronically Signed   By: Jeb Levering M.D.   On: 11/09/2016 01:19   Ct Abdomen Pelvis Wo Contrast  Result Date: 10/22/2016 CLINICAL DATA:  Abdominal pain, last bowel movement 4 days ago. EXAM: CT ABDOMEN AND PELVIS WITHOUT CONTRAST TECHNIQUE: Multidetector CT imaging of the abdomen and pelvis was performed following the  standard protocol without IV contrast. COMPARISON:  None. FINDINGS: Lower chest: Bibasilar atelectasis. The visualized cardiac chambers are top-normal size without pericardial effusion. Hepatobiliary: Contracted with slightly elongated left hepatic lobe again seen draping over the spleen. No space-occupying mass given limitations of a noncontrast study. The gallbladder is nondistended. No pericholecystic fluid or wall thickening. No definite calculus is noted within the gallbladder. Pancreas: Unremarkable Spleen: Unremarkable Adrenals/Urinary Tract: Bilateral adrenal glands are normal. Chronic  nephromegaly with perinephric fat stranding. Interval development of a moderate degree of right-sided hydroureteronephrosis secondary to a 5 mm calculus which has almost passed completely into the bladder, series 2, image 76. Nonobstructing left-sided renal calculi are noted. Stable pelvic phleboliths are otherwise noted within the pelvis bilaterally some which are vascular in etiology. Urinary bladder is physiologically distended. Stomach/Bowel: The stomach is contracted. There is normal small bowel rotation without dilatation. Normal appendix moderate degree of stool throughout large bowel consistent with constipation. Vascular/Lymphatic: Aortoiliac atherosclerosis. No aneurysm. Small retroperitoneal nonpathologic sized lymph nodes. Reproductive: Uterus and bilateral adnexa are unremarkable. Other: No abdominal wall hernia or abnormality. No abdominopelvic ascites. Musculoskeletal: No acute or significant osseous findings. IMPRESSION: 1. Moderate right-sided hydroureteronephrosis secondary to a 5 mm spindle shaped calcification which has almost passed completely into the bladder. 2. Nonobstructing left-sided nephrolithiasis. 3. 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 4. Increased colonic stool burden consistent with constipation. Electronically Signed   By: Ashley Royalty M.D.   On: 10/22/2016 21:06   Dg Chest 2 View  Result Date: 10/22/2016 CLINICAL DATA:  Cough and fever for 4 days.  Smoker. EXAM: CHEST  2 VIEW COMPARISON:  Chest CT June 26, 2016 FINDINGS: The heart size and mediastinal contours are within normal limits. Both lungs are clear. The visualized skeletal structures are unremarkable. IMPRESSION: Stable examination:  No acute cardiopulmonary process. Electronically Signed   By: Elon Alas M.D.   On: 10/22/2016 18:46   Dg Shoulder 1v Left  Result Date: 11/16/2016 CLINICAL  DATA:  Rotator cuff arthropathy EXAM: LEFT SHOULDER - 1 VIEW COMPARISON:  None. FINDINGS: There is no evidence of fracture or dislocation. There is no evidence of arthropathy or other focal bone abnormality. Soft tissues are unremarkable. IMPRESSION: Negative. Electronically Signed   By: Kerby Moors M.D.   On: 11/16/2016 13:50   Dg Abd Acute W/chest  Result Date: 11/08/2016 CLINICAL DATA:  Abdominal pain and vomiting.  Somnolence. EXAM: DG ABDOMEN ACUTE W/ 1V CHEST COMPARISON:  10/22/2016 CT abdomen and pelvis FINDINGS: No bowel obstruction. Increased colonic stool burden noted from cecum through rectum. Faint linear calcification may correspond with the known right UVJ stone versus stool particle noted in the right hemipelvis.Heart size and mediastinal contours are within normal limits. Minimal atelectasis at the lung bases. Faint opacity at the left costophrenic angle on the frontal view of the chest does not persist on additional imaging and may be due to overlap of the ribs and breast tissue. IMPRESSION: No acute cardiopulmonary disease. Increased colonic stool burden consistent with constipation. There is a linear tiny calcification in the right hemipelvis which could potentially represent the known right UVJ stone, vascular calcification or tiny focus of stool within overlying bowel. Electronically Signed   By: Ashley Royalty M.D.   On: 11/08/2016 22:21     TODAY-DAY OF DISCHARGE:  Subjective:   [name] Michael today has no headache,no chest abdominal pain,no new weakness tingling or numbness, feels much better  wants to go home today.   Objective:   Blood pressure 136/74, pulse 82, temperature 97.8 F (36.6 C), temperature source Oral, resp. rate 15, height 5' (1.524 m), weight 46 kg (101 lb 6.4 oz), last menstrual period 03/06/2013, SpO2 98 %.  Intake/Output Summary (Last 24 hours) at 11/21/16 1017 Last data filed at 11/21/16 0500  Gross per 24 hour  Intake              540 ml  Output               800 ml  Net             -260 ml   Filed Weights   11/19/16 0500 11/20/16 0359 11/21/16 0634  Weight: 47.9 kg (105 lb 8 oz) 47.3 kg (104 lb 3.2 oz) 46 kg (101 lb 6.4 oz)    Exam: Awake Alert, Oriented *3, No new F.N deficits, Normal affect [place].AT,PERRAL Supple Neck,No JVD, No cervical lymphadenopathy appriciated.  Symmetrical Chest wall movement, Good air movement bilaterally, CTAB RRR,No Gallops,Rubs or new Murmurs, No Parasternal Heave +ve B.Sounds, Abd Soft, Non tender, No organomegaly appriciated, No rebound -guarding or rigidity. No Cyanosis, Clubbing or edema, No new Rash or bruise   PERTINENT RADIOLOGIC STUDIES: Ct Abdomen Pelvis Wo Contrast  Result Date: 11/09/2016 CLINICAL DATA:  Vomiting and upper abdominal pain. EXAM: CT ABDOMEN AND PELVIS WITHOUT CONTRAST TECHNIQUE: Multidetector CT imaging of the abdomen and pelvis was performed following the standard protocol without IV contrast. COMPARISON:  CT 10/22/2016, additional priors FINDINGS: Lower chest: Motion artifact.  No pleural fluid or consolidation. Hepatobiliary: No focal hepatic lesion. Gallbladder is decompressed, no calcified stone. No biliary dilatation. Pancreas: A few chronic pancreatic head calcifications. No ductal dilatation or inflammation. Spleen: Normal in size without focal abnormality. Adrenals/Urinary Tract: Enlarged edematous appearing kidneys with perinephric edema, chronic finding unchanged from December 2017. Previous right hydronephrosis has resolved. Previous stone in the distal right ureter is no longer seen. No left hydronephrosis. Bilateral nonobstructing intrarenal calculi are again seen. Urinary bladder is physiologically distended without wall thickening. No adrenal nodule. Stomach/Bowel: Stomach distended with ingested contrast, no gastric wall thickening. No bowel obstruction or inflammation. Moderate diffuse stool burden throughout the colon. Air and enteric contrast in the proximal appendix, no  evidence of appendicitis. Vascular/Lymphatic: Aortic and branch atherosclerosis. No adenopathy. Reproductive: Uterus and bilateral adnexa are unremarkable. Other: No free air or free fluid.  No intra-abdominal abscess. Musculoskeletal: There are no acute or suspicious osseous abnormalities. IMPRESSION: 1. Resolved right hydronephrosis and distal right ureteric stone. 2. Enlarged edematous kidneys with perinephric edema, a chronic finding and unchanged over the past 6 months. Bilateral nonobstructing renal calculi. 3. No new abnormality. 4. Moderate stool burden suggesting constipation. Aortic atherosclerosis without aneurysm. Electronically Signed   By: Jeb Levering M.D.   On: 11/09/2016 01:19   Ct Abdomen Pelvis Wo Contrast  Result Date: 10/22/2016 CLINICAL DATA:  Abdominal pain, last bowel movement 4 days ago. EXAM: CT ABDOMEN AND PELVIS WITHOUT CONTRAST TECHNIQUE: Multidetector CT imaging of the abdomen and pelvis was performed following the standard protocol without IV contrast. COMPARISON:  None. FINDINGS: Lower chest: Bibasilar atelectasis. The visualized cardiac chambers are top-normal size without pericardial effusion. Hepatobiliary: Contracted with slightly elongated left hepatic lobe again seen draping over the spleen. No space-occupying mass given limitations of a noncontrast study. The gallbladder is nondistended. No pericholecystic fluid or wall thickening. No definite calculus is noted within the gallbladder. Pancreas: Unremarkable Spleen: Unremarkable Adrenals/Urinary Tract:  Bilateral adrenal glands are normal. Chronic nephromegaly with perinephric fat stranding. Interval development of a moderate degree of right-sided hydroureteronephrosis secondary to a 5 mm calculus which has almost passed completely into the bladder, series 2, image 76. Nonobstructing left-sided renal calculi are noted. Stable pelvic phleboliths are otherwise noted within the pelvis bilaterally some which are vascular in  etiology. Urinary bladder is physiologically distended. Stomach/Bowel: The stomach is contracted. There is normal small bowel rotation without dilatation. Normal appendix moderate degree of stool throughout large bowel consistent with constipation. Vascular/Lymphatic: Aortoiliac atherosclerosis. No aneurysm. Small retroperitoneal nonpathologic sized lymph nodes. Reproductive: Uterus and bilateral adnexa are unremarkable. Other: No abdominal wall hernia or abnormality. No abdominopelvic ascites. Musculoskeletal: No acute or significant osseous findings. IMPRESSION: 1. Moderate right-sided hydroureteronephrosis secondary to a 5 mm spindle shaped calcification which has almost passed completely into the bladder. 2. Nonobstructing left-sided nephrolithiasis. 3. 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 4. Increased colonic stool burden consistent with constipation. Electronically Signed   By: Ashley Royalty M.D.   On: 10/22/2016 21:06   Dg Chest 2 View  Result Date: 10/22/2016 CLINICAL DATA:  Cough and fever for 4 days.  Smoker. EXAM: CHEST  2 VIEW COMPARISON:  Chest CT June 26, 2016 FINDINGS: The heart size and mediastinal contours are within normal limits. Both lungs are clear. The visualized skeletal structures are unremarkable. IMPRESSION: Stable examination:  No acute cardiopulmonary process. Electronically Signed   By: Elon Alas M.D.   On: 10/22/2016 18:46   Dg Shoulder 1v Left  Result Date: 11/16/2016 CLINICAL DATA:  Rotator cuff arthropathy EXAM: LEFT SHOULDER - 1 VIEW COMPARISON:  None. FINDINGS: There is no evidence of fracture or dislocation. There is no evidence of arthropathy or other focal bone abnormality. Soft tissues are unremarkable. IMPRESSION: Negative. Electronically Signed   By: Kerby Moors M.D.   On: 11/16/2016 13:50   Dg Abd Acute W/chest  Result Date:  11/08/2016 CLINICAL DATA:  Abdominal pain and vomiting.  Somnolence. EXAM: DG ABDOMEN ACUTE W/ 1V CHEST COMPARISON:  10/22/2016 CT abdomen and pelvis FINDINGS: No bowel obstruction. Increased colonic stool burden noted from cecum through rectum. Faint linear calcification may correspond with the known right UVJ stone versus stool particle noted in the right hemipelvis.Heart size and mediastinal contours are within normal limits. Minimal atelectasis at the lung bases. Faint opacity at the left costophrenic angle on the frontal view of the chest does not persist on additional imaging and may be due to overlap of the ribs and breast tissue. IMPRESSION: No acute cardiopulmonary disease. Increased colonic stool burden consistent with constipation. There is a linear tiny calcification in the right hemipelvis which could potentially represent the known right UVJ stone, vascular calcification or tiny focus of stool within overlying bowel. Electronically Signed   By: Ashley Royalty M.D.   On: 11/08/2016 22:21     PERTINENT LAB RESULTS: CBC:  Recent Labs  11/19/16 0029  HGB 10.4*  HCT 31.1*   CMET CMP     Component Value Date/Time   NA 139 11/21/2016 0539   K 5.4 (H) 11/21/2016 0539   CL 103 11/21/2016 0539   CO2 23 11/21/2016 0539   GLUCOSE 96 11/21/2016 0539   BUN 53 (H) 11/21/2016 0539   CREATININE 4.08 (H) 11/21/2016 0539   CREATININE 0.56 12/20/2012 0906   CALCIUM 7.4 (L) 11/21/2016 0539   CALCIUM 13.2 ([place]) 11/09/2016 0719  PROT 6.3 (L) 11/13/2016 0505   ALBUMIN 2.2 (L) 11/21/2016 0539   AST 13 (L) 11/13/2016 0505   ALT 12 (L) 11/13/2016 0505   ALKPHOS 79 11/13/2016 0505   BILITOT 0.5 11/13/2016 0505   GFRNONAA 11 (L) 11/21/2016 0539   GFRAA 13 (L) 11/21/2016 0539    GFR Estimated Creatinine Clearance: 11.1 mL/min (A) (by C-G formula based on SCr of 4.08 mg/dL (H)). No results for input(s): LIPASE, AMYLASE in the last 72 hours. No results for input(s): CKTOTAL, CKMB, CKMBINDEX,  TROPONINI in the last 72 hours. Invalid input(s): POCBNP No results for input(s): DDIMER in the last 72 hours.  Recent Labs  11/19/16 1132  HGBA1C 8.2*   No results for input(s): CHOL, HDL, LDLCALC, TRIG, CHOLHDL, LDLDIRECT in the last 72 hours. No results for input(s): TSH, T4TOTAL, T3FREE, THYROIDAB in the last 72 hours.  Invalid input(s): FREET3 No results for input(s): VITAMINB12, FOLATE, FERRITIN, TIBC, IRON, RETICCTPCT in the last 72 hours. Coags: No results for input(s): INR in the last 72 hours.  Invalid input(s): PT Microbiology: Recent Results (from the past 240 hour(s))  MRSA PCR Screening     Status: Abnormal   Collection Time: 11/17/16  1:40 PM  Result Value Ref Range Status   MRSA by PCR POSITIVE (A) NEGATIVE Final    Comment:        The GeneXpert MRSA Assay (FDA approved for NASAL specimens only), is one component of a comprehensive MRSA colonization surveillance program. It is not intended to diagnose MRSA infection nor to guide or monitor treatment for MRSA infections. RESULT CALLED TO, READ BACK BY AND VERIFIED WITH: Gypsy Lore RN 15:20 11/17/16 ([email or web address])     FURTHER DISCHARGE INSTRUCTIONS:  Get Medicines reviewed and adjusted: Please take all your medications with you for your next visit with your Primary MD  Laboratory/radiological data: Please request your Primary MD to go over all hospital tests and procedure/radiological results at the follow up, please ask your Primary MD to get all Hospital records sent to his/her office.  In some cases, they will be blood work, cultures and biopsy results pending at the time of your discharge. Please request that your primary care M.D. goes through all the records of your hospital data and follows up on these results.  Also Note the following: If you experience worsening of your admission symptoms, develop shortness of breath, life threatening emergency, suicidal or homicidal thoughts you must seek medical  attention immediately by calling 911 or calling your MD immediately  if symptoms less severe.  You must read complete instructions/literature along with all the possible adverse reactions/side effects for all the Medicines you take and that have been prescribed to you. Take any new Medicines after you have completely understood and accpet all the possible adverse reactions/side effects.   Do not drive when taking Pain medications or sleeping medications (Benzodaizepines)  Do not take more than prescribed Pain, Sleep and Anxiety Medications. It is not advisable to combine anxiety,sleep and pain medications without talking with your primary care practitioner  Special Instructions: If you have smoked or chewed Tobacco  in the last 2 yrs please stop smoking, stop any regular Alcohol  and or any Recreational drug use.  Wear Seat belts while driving.  Please note: You were cared for by a hospitalist during your hospital stay. Once you are discharged, your primary care physician will handle any further medical issues. Please note that NO REFILLS for any discharge medications will be authorized once you  are discharged, as it is imperative that you return to your primary care physician (or establish a relationship with a primary care physician if you do not have one) for your post hospital discharge needs so that they can reassess your need for medications and monitor your lab values.  Total Time spent coordinating discharge including counseling, education and face to face time equals 45 minutes.  SignedOren Binet 11/21/2016 10:17 AM

## 2017-01-28 ENCOUNTER — Ambulatory Visit: Payer: Medicaid Other | Admitting: Podiatry

## 2017-03-19 ENCOUNTER — Encounter (HOSPITAL_COMMUNITY): Payer: Self-pay | Admitting: Emergency Medicine

## 2017-03-19 ENCOUNTER — Emergency Department (HOSPITAL_COMMUNITY)
Admission: EM | Admit: 2017-03-19 | Discharge: 2017-03-20 | Disposition: A | Discharge status: Home / Self Care | Payer: Medicaid Other | Attending: Emergency Medicine | Admitting: Emergency Medicine

## 2017-03-19 DIAGNOSIS — E11311 Type 2 diabetes mellitus with unspecified diabetic retinopathy with macular edema: Secondary | ICD-10-CM | POA: Insufficient documentation

## 2017-03-19 DIAGNOSIS — N3001 Acute cystitis with hematuria: Secondary | ICD-10-CM | POA: Diagnosis not present

## 2017-03-19 DIAGNOSIS — F17210 Nicotine dependence, cigarettes, uncomplicated: Secondary | ICD-10-CM | POA: Diagnosis not present

## 2017-03-19 DIAGNOSIS — Z794 Long term (current) use of insulin: Secondary | ICD-10-CM | POA: Diagnosis not present

## 2017-03-19 DIAGNOSIS — I110 Hypertensive heart disease with heart failure: Secondary | ICD-10-CM | POA: Insufficient documentation

## 2017-03-19 DIAGNOSIS — Z79899 Other long term (current) drug therapy: Secondary | ICD-10-CM | POA: Insufficient documentation

## 2017-03-19 DIAGNOSIS — I5032 Chronic diastolic (congestive) heart failure: Secondary | ICD-10-CM | POA: Insufficient documentation

## 2017-03-19 DIAGNOSIS — Z7982 Long term (current) use of aspirin: Secondary | ICD-10-CM | POA: Insufficient documentation

## 2017-03-19 DIAGNOSIS — R300 Dysuria: Secondary | ICD-10-CM | POA: Diagnosis present

## 2017-03-19 LAB — CBC WITH DIFFERENTIAL/PLATELET
Basophils Absolute: 0 10*3/uL (ref 0.0–0.1)
Basophils Relative: 0 %
Eosinophils Absolute: 0.2 10*3/uL (ref 0.0–0.7)
Eosinophils Relative: 1 %
HEMATOCRIT: 33.7 % — AB (ref 36.0–46.0)
HEMOGLOBIN: 11.3 g/dL — AB (ref 12.0–15.0)
LYMPHS ABS: 1.5 10*3/uL (ref 0.7–4.0)
Lymphocytes Relative: 12 %
MCH: 29.8 pg (ref 26.0–34.0)
MCHC: 33.5 g/dL (ref 30.0–36.0)
MCV: 88.9 fL (ref 78.0–100.0)
MONOS PCT: 7 %
Monocytes Absolute: 0.9 10*3/uL (ref 0.1–1.0)
NEUTROS ABS: 10 10*3/uL — AB (ref 1.7–7.7)
NEUTROS PCT: 80 %
Platelets: 358 10*3/uL (ref 150–400)
RBC: 3.79 MIL/uL — ABNORMAL LOW (ref 3.87–5.11)
RDW: 12.9 % (ref 11.5–15.5)
WBC: 12.6 10*3/uL — ABNORMAL HIGH (ref 4.0–10.5)

## 2017-03-19 LAB — URINALYSIS, ROUTINE W REFLEX MICROSCOPIC
BILIRUBIN URINE: NEGATIVE
Glucose, UA: NEGATIVE mg/dL
Hgb urine dipstick: NEGATIVE
Ketones, ur: NEGATIVE mg/dL
Nitrite: NEGATIVE
PH: 6 (ref 5.0–8.0)
Protein, ur: 30 mg/dL — AB
SPECIFIC GRAVITY, URINE: 1.009 (ref 1.005–1.030)

## 2017-03-19 LAB — I-STAT CG4 LACTIC ACID, ED: Lactic Acid, Venous: 0.9 mmol/L (ref 0.5–1.9)

## 2017-03-19 NOTE — ED Provider Notes (Signed)
[facility] DEPT Provider Note   CSN: [id number] Arrival date & time: 03/19/17  7619     History   Chief Complaint Chief Complaint  Patient presents with  . Dysuria    HPI [name] Michael is a 56 y.o. female.  HPI [name] Michael is a 56 y.o. female with history of diabetes, hypertension, CVA, chronic kidney disease, presents to emergency department complaining of dysuria, urinary frequency, urinary urgency. Patient states her symptoms started about a month ago, states is getting worse. Reports associated right flank pain, but also states has chronic back pain so not sure what is the cause of the flank pain. Denies any abdominal pain. Had one episode of emesis today. No fever or chills. No vaginal discharge or bleeding. No changes in her bowels. States had a urinary tract infection 2 months ago for which she was given antibiotics, she does not remember the name of antibiotics. Patient is a poor historian.  Past Medical History:  Diagnosis Date  . Diabetes mellitus   . Hyperlipidemia   . Hypertension   . Stroke [facility]) 04-01-11   left frontal subcortical, saw Dr. [name]   . TIA (transient ischemic attack) 03-12-11    Patient Active Problem List   Diagnosis Date Noted  . AKI (acute kidney injury) ([place]) 11/09/2016  . Hypercalcemia 11/09/2016  . Hyponatremia 11/09/2016  . Hypovolemia 11/09/2016  . Accelerated hypertension 11/09/2016  . Acute lower UTI 11/09/2016  . Nephrolithiasis 10/24/2016  . Constipation 10/22/2016  . CAP (community acquired pneumonia) 10/22/2016  . Hydronephrosis of right kidney 10/22/2016  . Metabolic acidosis 50/93/2671  . CVA (cerebral vascular accident) ([place]) 07/03/2016  . HAP (hospital-acquired pneumonia) 07/03/2016  . Sepsis secondary to UTI ([place])   . UTI due to extended-spectrum beta lactamase (ESBL) producing Escherichia coli   . Acute pyelonephritis   . Bacteremia due to Escherichia coli   . Colitis, indeterminate   . Uncontrolled type 2  diabetes mellitus with complication ([place])   . 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 associated with diabetes mellitus due to underlying condition ([place])   . Chronic diastolic CHF (congestive heart failure) ([place])   . Abdominal pain   . Colitis   . Hypophosphatemia 06/12/2016  . Hypokalemia 06/11/2016  . Hypocalcemia 06/11/2016  . Acute kidney injury superimposed on chronic kidney disease ([place]) 06/11/2016  . Proliferative diabetic retinopathy ([place]) 04/29/2016  . Closed fracture of left distal radius 01/28/2016  . Poor social situation 03/09/2013  . Depression 03/01/2013  . Abnormal mammogram 12/20/2012  . Retinal detachment 11/17/2012  . 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 ([place]) 24/58/0998  . Hyperlipidemia 02/09/2007  . Essential hypertension 02/09/2007  . GERD 02/09/2007    Past Surgical History:  Procedure Laterality Date  . OPEN REDUCTION INTERNAL FIXATION (ORIF) DISTAL RADIAL FRACTURE Left 01/28/2016   Procedure: OPEN REDUCTION INTERNAL FIXATION (ORIF) DISTAL RADIAL FRACTURE;  Surgeon: Iran Planas, MD;  Location: [place];  Service: Orthopedics;  Laterality: Left;  . [place]     age 74    OB History    No data available       Home Medications    Prior to Admission medications   Medication Sig Start Date End Date Taking? Authorizing Provider  amLODipine (NORVASC) 10 MG tablet Take 1 tablet (10 mg total) by mouth daily. 11/22/16  Yes Ghimire, Henreitta Leber, MD  aspirin 81 MG tablet Take 1  tablet (81 mg total) by mouth daily. 08/24/12  Yes Reyne Dumas, MD  atorvastatin (LIPITOR) 40 MG tablet Take 40 mg by mouth daily.   Yes [provider]  furosemide (LASIX) 20 MG tablet Take 20 mg by mouth daily.   Yes [provider]  hydrALAZINE (APRESOLINE) 25 MG tablet Take 1 tablet (25 mg total) by mouth 3 (three) times daily. 11/21/16  Yes Ghimire, Henreitta Leber,  MD  insulin aspart (NOVOLOG) 100 UNIT/ML injection Inject 8 Units into the skin 3 (three) times daily with meals. 11/21/16  Yes Ghimire, Henreitta Leber, MD  insulin glargine (LANTUS) 100 UNIT/ML injection Inject 0.24 mLs (24 Units total) into the skin daily. 11/22/16  Yes Ghimire, Henreitta Leber, MD  lidocaine (LIDODERM) 5 % Place 1 patch onto the skin daily. Remove & Discard patch within 12 hours or as directed by MD 11/21/16  Yes Ghimire, Henreitta Leber, MD  linagliptin (TRADJENTA) 5 MG TABS tablet Take 5 mg by mouth daily.   Yes [provider]  magnesium oxide (MAG-OX) 400 (241.3 Mg) MG tablet Take 1 tablet (400 mg total) by mouth 2 (two) times daily. 11/21/16  Yes Ghimire, Henreitta Leber, MD  metoprolol (LOPRESSOR) 100 MG tablet Take 100 mg by mouth 2 (two) times daily.   Yes [provider]  Multiple Vitamins-Minerals (CERTAVITE SENIOR/ANTIOXIDANT) TABS Take 1 tablet by mouth daily.   Yes [provider]  nystatin (NYSTATIN) powder Apply 1 g topically 2 (two) times daily.   Yes [provider]  omeprazole (PRILOSEC) 40 MG capsule Take 40 mg by mouth daily.   Yes [provider]  polyethylene glycol (MIRALAX / GLYCOLAX) packet Take 17 g by mouth daily. 11/22/16  Yes Ghimire, Henreitta Leber, MD  ranitidine (ZANTAC) 150 MG tablet Take 1 tablet (150 mg total) by mouth 2 (two) times daily. 10/14/16  Yes Street, [place], PA-C  Saccharomyces boulardii (PROBIOTIC) 250 MG CAPS Take 1 capsule by mouth 2 (two) times daily.   Yes [provider]  sodium bicarbonate 650 MG tablet Take 1 tablet (650 mg total) by mouth 2 (two) times daily. 06/30/16  Yes Patrecia Pour, MD  Vitamin D, Ergocalciferol, (DRISDOL) 50000 units CAPS capsule Take 1 capsule (50,000 Units total) by mouth every 7 (seven) days. 11/21/16  Yes Ghimire, Henreitta Leber, MD  atorvastatin (LIPITOR) 20 MG tablet Take 1 tablet (20 mg total) by mouth daily. Patient not taking: Reported on 03/19/2017 04/29/16   Arnoldo Morale, MD  calcium  carbonate (TUMS - DOSED IN MG ELEMENTAL CALCIUM) 500 MG chewable tablet Chew 4 tablets (800 mg of elemental calcium total) by mouth 3 (three) times daily. Patient taking differently: Chew 800 mg of elemental calcium by mouth 3 (three) times daily as needed for indigestion or heartburn.  11/21/16   Ghimire, Henreitta Leber, MD  feeding supplement (BOOST / RESOURCE BREEZE) LIQD Take 1 Container by mouth 3 (three) times daily between meals. Patient not taking: Reported on 03/19/2017 11/21/16   Jonetta Osgood, MD  furosemide (LASIX) 40 MG tablet Take 1 tablet (40 mg total) by mouth daily. Patient not taking: Reported on 03/19/2017 11/21/16   Jonetta Osgood, MD  ondansetron (ZOFRAN ODT) 4 MG disintegrating tablet Take 1 tablet (4 mg total) by mouth every 8 (eight) hours as needed for nausea or vomiting. 10/14/16   Street, [place], PA-C    Family History Family History  Problem Relation Age of Onset  . Stroke Mother   . Diabetes Mother   .  Kidney failure Mother   . Heart failure Mother   . Stroke Father   . Cancer Sister        Breast- 34's    Social History Social History  Substance Use Topics  . Smoking status: Current Every Day Smoker    Packs/day: 1.00    Years: 32.00    Types: Cigarettes    Last attempt to quit: 09/23/2012  . Smokeless tobacco: Never Used     Comment: or less  . Alcohol use No     Allergies   Hydrocodone   Review of Systems Review of Systems  Constitutional: Negative for chills and fever.  Respiratory: Negative for cough, chest tightness and shortness of breath.   Cardiovascular: Negative for chest pain, palpitations and leg swelling.  Gastrointestinal: Positive for abdominal pain, nausea and vomiting. Negative for diarrhea.  Genitourinary: Positive for dysuria, flank pain, frequency and urgency. Negative for pelvic pain, vaginal bleeding, vaginal discharge and vaginal pain.  Musculoskeletal: Negative for arthralgias, myalgias, neck pain and neck stiffness.    Skin: Negative for rash.  Neurological: Negative for dizziness, weakness and headaches.  All other systems reviewed and are negative.    Physical Exam Updated Vital Signs BP 126/69 (BP Location: Left Arm)   Pulse 84   Temp 98.3 F (36.8 C)   Resp 18   LMP 03/06/2013   SpO2 97%   Physical Exam  Constitutional: She is oriented to person, place, and time. She appears well-developed and well-nourished. No distress.  HENT:  Head: Normocephalic.  Eyes: Conjunctivae are normal.  Neck: Neck supple.  Cardiovascular: Normal rate, regular rhythm and normal heart sounds.   Pulmonary/Chest: Effort normal and breath sounds normal. No respiratory distress. She has no wheezes. She has no rales.  Abdominal: Soft. Bowel sounds are normal. She exhibits no distension. There is tenderness. There is no rebound.  Diffuse tenderness. Right CVA tenderness  Musculoskeletal: She exhibits no edema.  Neurological: She is alert and oriented to person, place, and time.  Skin: Skin is warm and dry.  Psychiatric: She has a normal mood and affect. Her behavior is normal.  Nursing note and vitals reviewed.    ED Treatments / Results  Labs (all labs ordered are listed, but only abnormal results are displayed) Labs Reviewed  URINALYSIS, ROUTINE W REFLEX MICROSCOPIC - Abnormal; Notable for the following:       Result Value   APPearance HAZY (*)    Protein, ur 30 (*)    Leukocytes, UA LARGE (*)    Bacteria, UA MANY (*)    Squamous Epithelial / LPF 0-5 (*)    All other components within normal limits  CBC WITH DIFFERENTIAL/PLATELET  COMPREHENSIVE METABOLIC PANEL  I-STAT CG4 LACTIC ACID, ED    EKG  EKG Interpretation None       Radiology No results found.  Procedures Procedures (including critical care time)  Medications Ordered in ED Medications - No data to display   Initial Impression / Assessment and Plan / ED Course  I have reviewed the triage vital signs and the nursing  notes.  Pertinent labs & imaging results that were available during my care of the patient were reviewed by me and considered in my medical decision making (see chart for details).     Patient seen and examined. Patient with urinary symptoms, right flank pain. Urinalysis already obtained, showing large leukocytes, too numerous to count white blood cells, many bacteria. We'll send cultures, will obtain basic labs. VS normal. No  signs of sepsis.   1:13 AM Labs show elevated BUN/creatinine, however at baseline. White blood cell count slightly up at 12.6. Otherwise unremarkable labs. Negative lactic acid. Vital signs remained normal. No concern for sepsis. Possible pyelonephritis, however no nausea or vomiting in ED, afebrile. Plan to start on antibiotics, first dose ordered in the emergency department. We'll discharge home with antibiotics with close outpatient follow-up. Return precautions discussed. Patient agreeable to the plan.  Vitals:   03/19/17 2330 03/20/17 0101  BP: 136/72 109/62  Pulse: 89 89  Resp:  20  Temp:    SpO2: 97% 96%     Final Clinical Impressions(s) / ED Diagnoses   Final diagnoses:  Acute cystitis with hematuria    New Prescriptions New Prescriptions   CEPHALEXIN (KEFLEX) 500 MG CAPSULE    Take 1 capsule (500 mg total) by mouth 2 (two) times daily.     Jeannett Senior, PA-C 03/20/17 0118    Molpus, Jenny Reichmann, MD 03/20/17 [id number]

## 2017-03-19 NOTE — ED Triage Notes (Signed)
Per EMS, patient from [place], c/o pain with urination today. Patient also c/o chronic pain in left arm, wrist, and bilateral legs. Hx chronic kidney disease. Denies abdominal pain.   BP 94/58 HR 78 O2 95% CBG 99

## 2017-03-20 LAB — COMPREHENSIVE METABOLIC PANEL
ALBUMIN: 4.3 g/dL (ref 3.5–5.0)
ALT: 11 U/L — ABNORMAL LOW (ref 14–54)
AST: 12 U/L — AB (ref 15–41)
Alkaline Phosphatase: 66 U/L (ref 38–126)
Anion gap: 15 (ref 5–15)
BILIRUBIN TOTAL: 0.3 mg/dL (ref 0.3–1.2)
BUN: 68 mg/dL — AB (ref 6–20)
CHLORIDE: 98 mmol/L — AB (ref 101–111)
CO2: 25 mmol/L (ref 22–32)
Calcium: 9.9 mg/dL (ref 8.9–10.3)
Creatinine, Ser: 4.39 mg/dL — ABNORMAL HIGH (ref 0.44–1.00)
GFR calc Af Amer: 12 mL/min — ABNORMAL LOW (ref >60)
GFR calc non Af Amer: 10 mL/min — ABNORMAL LOW (ref >60)
GLUCOSE: 78 mg/dL (ref 65–99)
POTASSIUM: 3.9 mmol/L (ref 3.5–5.1)
Sodium: 138 mmol/L (ref 135–145)
Total Protein: 9.6 g/dL — ABNORMAL HIGH (ref 6.5–8.1)

## 2017-03-20 MED ORDER — CEPHALEXIN 500 MG PO CAPS: 500.0000 mg | ORAL_CAPSULE | Freq: Two times a day (BID) | ORAL | 0 refills | Status: DC

## 2017-03-20 MED ORDER — CEPHALEXIN 500 MG PO CAPS
500.0000 mg | ORAL_CAPSULE | Freq: Once | ORAL | Status: AC
  Administered 2017-03-20: 500 mg via ORAL
  Filled 2017-03-20: qty 1

## 2017-03-20 NOTE — ED Notes (Signed)
Patient denies pain and is resting comfortably.  

## 2017-03-20 NOTE — Discharge Instructions (Signed)
Take keflex as prescribed until all gone for UTI. Follow up with primary care doctor in 3 days for recheck. Return if worsening.

## 2017-03-20 NOTE — ED Notes (Signed)
Patient verbalized understanding of discharge instructions and importance of completing entire course of antibiotics.

## 2017-03-21 LAB — URINE CULTURE

## 2017-03-22 ENCOUNTER — Telehealth: Payer: Self-pay

## 2017-03-22 NOTE — Telephone Encounter (Signed)
Post ED Visit - Positive Culture Follow-up  Culture report reviewed by antimicrobial stewardship pharmacist:  []  Elenor Quinones, Pharm.D. []  Heide Guile, Pharm.D., BCPS AQ-ID [x]  Parks Neptune, Pharm.D., BCPS []  Alycia Rossetti, Pharm.D., BCPS []  [place], [place].D., BCPS, AAHIVP []  Legrand Como, Pharm.D., BCPS, AAHIVP []  Salome Arnt, PharmD, BCPS []  Dimitri Ped, PharmD, BCPS []  Vincenza Hews, PharmD, BCPS  Positive urine culture Treated with Cephalexin, organism sensitive to the same and no further patient follow-up is required at this time.  Genia Del 03/22/2017, 10:15 AM

## 2017-04-03 ENCOUNTER — Other Ambulatory Visit: Payer: Self-pay | Admitting: Nephrology

## 2017-04-03 DIAGNOSIS — N179 Acute kidney failure, unspecified: Secondary | ICD-10-CM

## 2017-04-03 DIAGNOSIS — N184 Chronic kidney disease, stage 4 (severe): Secondary | ICD-10-CM

## 2017-05-01 ENCOUNTER — Ambulatory Visit
Admission: RE | Admit: 2017-05-01 | Discharge: 2017-05-01 | Disposition: A | Discharge status: Home / Self Care | Payer: Medicaid Other | Source: Ambulatory Visit | Attending: Nephrology | Admitting: Nephrology

## 2017-05-01 DIAGNOSIS — N179 Acute kidney failure, unspecified: Secondary | ICD-10-CM

## 2017-05-01 DIAGNOSIS — N184 Chronic kidney disease, stage 4 (severe): Secondary | ICD-10-CM

## 2017-08-04 IMAGING — CT CT ABD-PELV W/O CM
2 of 4 series · 15 of 46 positions shown, 17 images · non-contrast
Comparison: None.

CLINICAL DATA: Fall in the shower, week with diarrhea and nausea
and vomiting

EXAM:
CT ABDOMEN AND PELVIS WITHOUT CONTRAST
TECHNIQUE: Multidetector CT imaging of the abdomen and pelvis was performed
following the standard protocol without IV contrast.

[Series 2: abd/pel w/o · axial · non-contrast · 0.61mm/px · z∈[+978,+1358]mm · 12 of 86 slices shown, 14 images]
[im 5/86  soft-tissue]
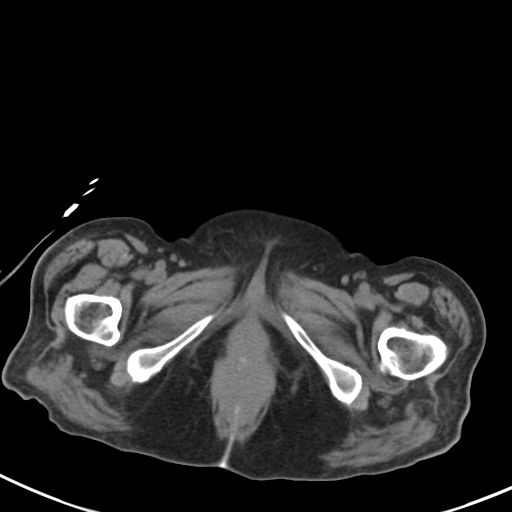
[im 5/86  bone]
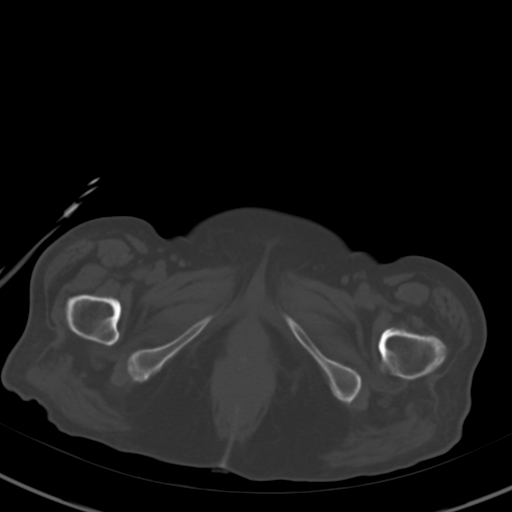
[im 15/86  soft-tissue]
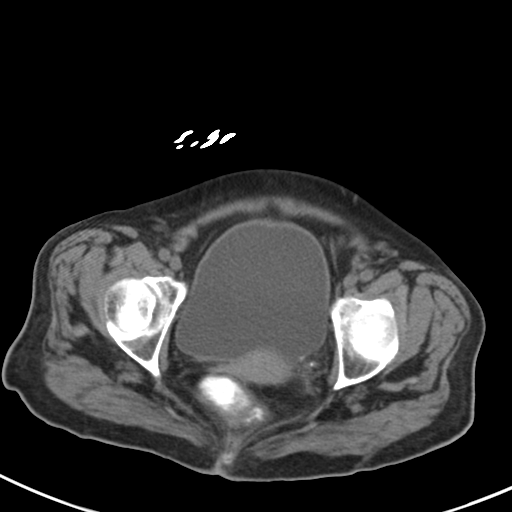
[im 19/86  soft-tissue]
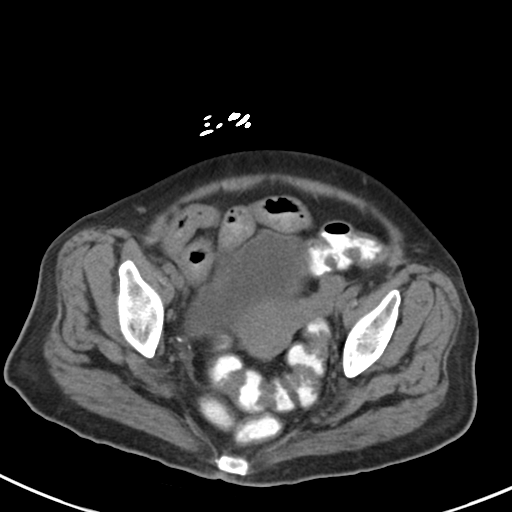
[im 24/86  soft-tissue]
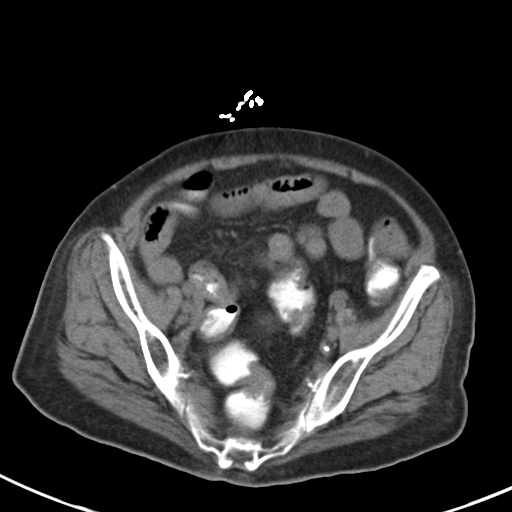
[im 34/86  soft-tissue]
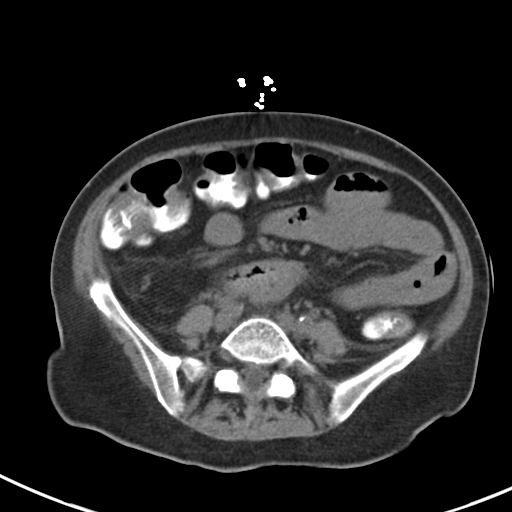
[im 38/86  soft-tissue]
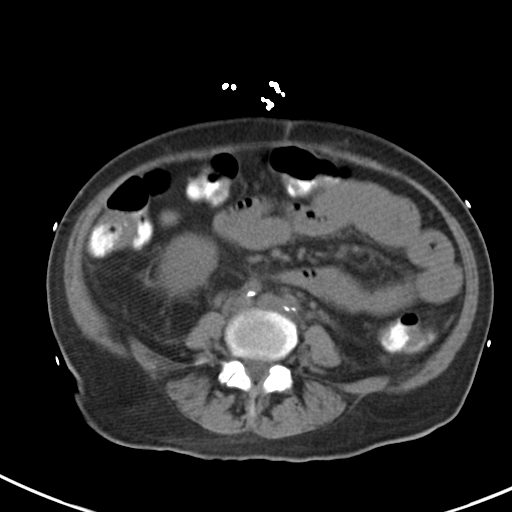
[im 48/86  soft-tissue]
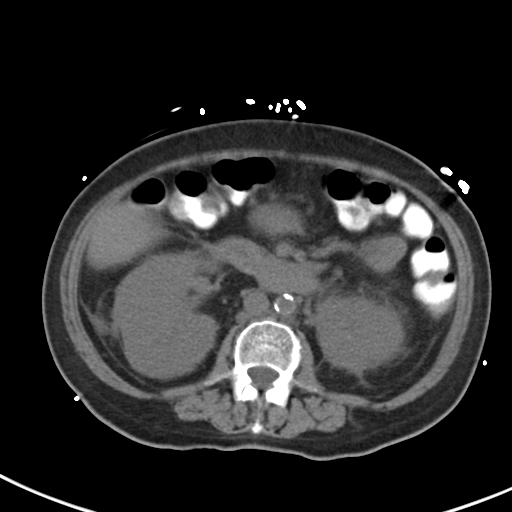
[im 52/86  soft-tissue]
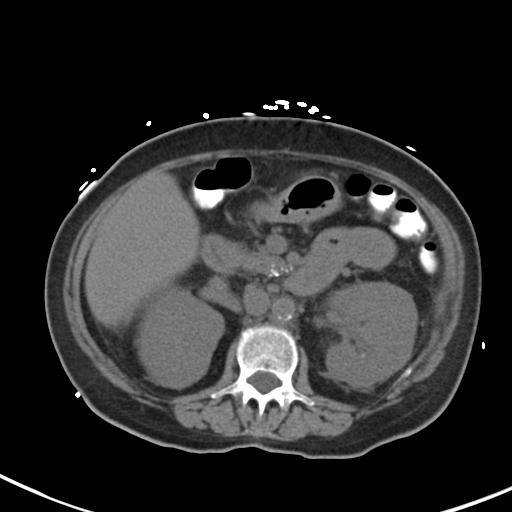
[im 62/86  soft-tissue]
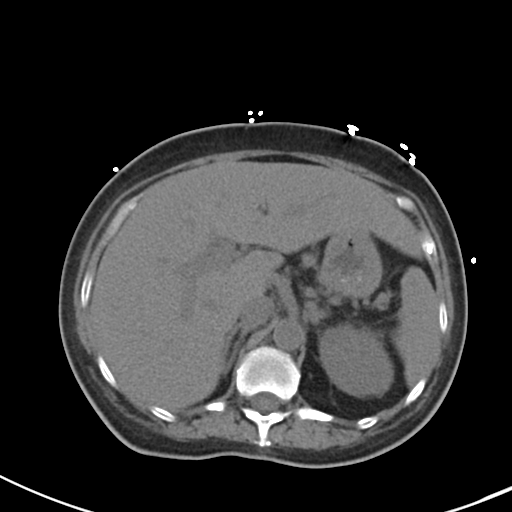
[im 62/86  bone]
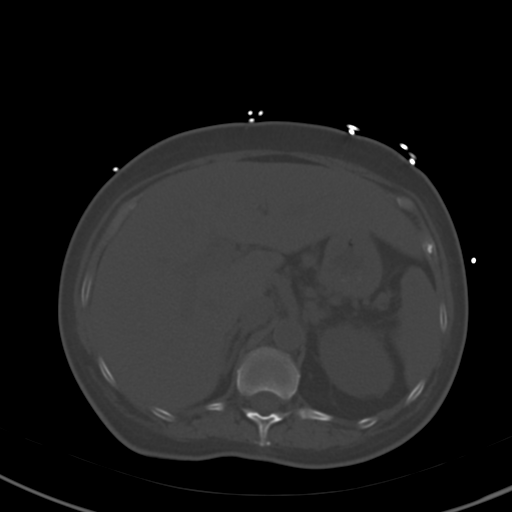
[im 67/86  soft-tissue]
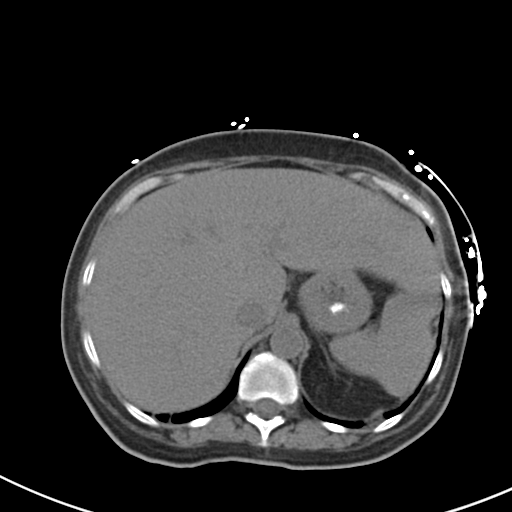
[im 71/86  soft-tissue]
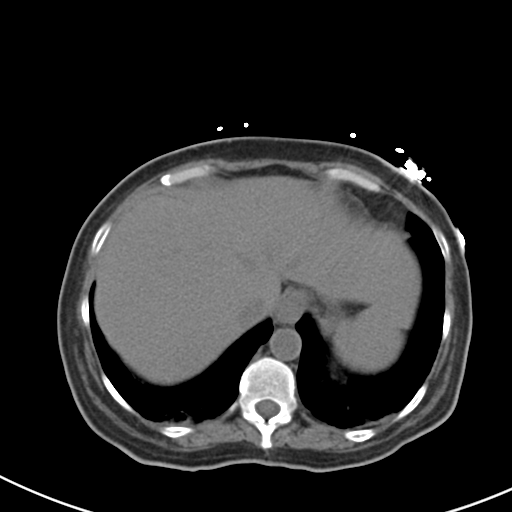
[im 81/86  soft-tissue]
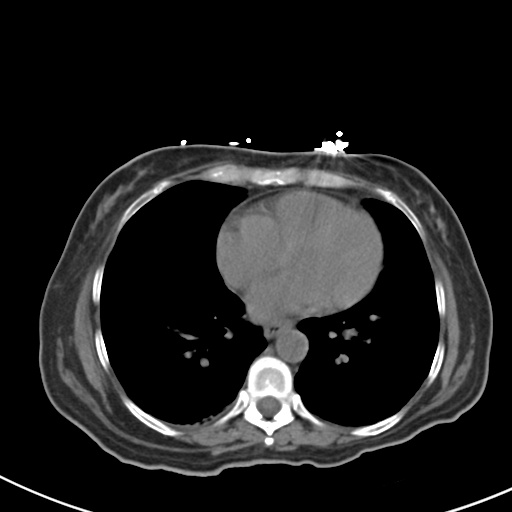

[Series 3: coronal · coronal · 0.55mm/px · 3 of 109 slices shown]
[im 37/109  soft-tissue]
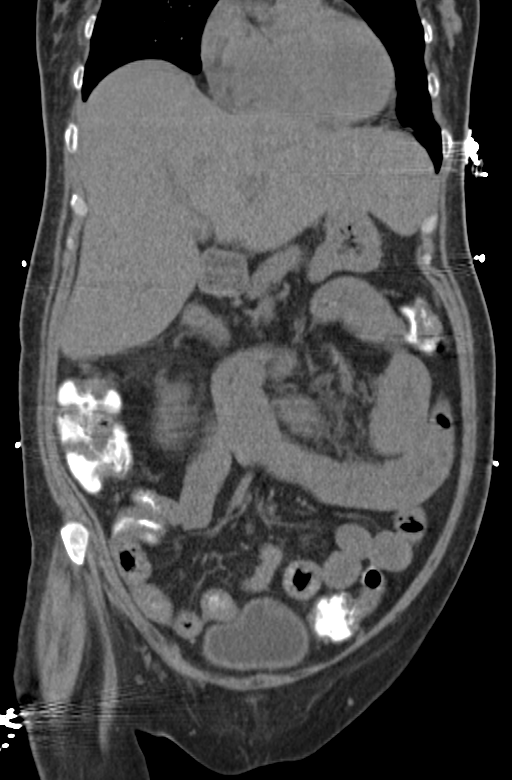
[im 49/109  soft-tissue]
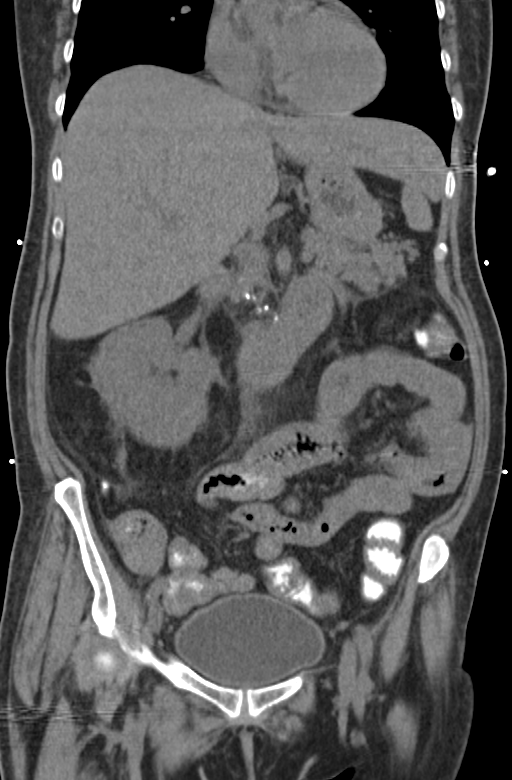
[im 61/109  soft-tissue]
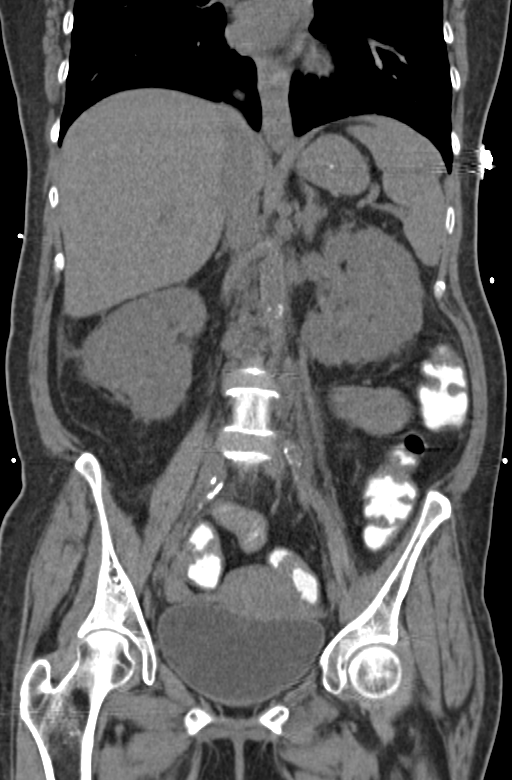

[15 of 46 positions shown; findings below may reference images not displayed]

FINDINGS: Lower chest: Lung bases demonstrate mild dependent atelectasis
posteriorly. Minimal bronchiectasis posterior right lower lobe. No
pleural effusion. Heart size nonenlarged.

Hepatobiliary: No focal liver abnormality is seen. No gallstones,
gallbladder wall thickening, or biliary dilatation.

Pancreas: No pancreatic ductal dilatation or surrounding
inflammatory changes. Few scattered calcifications at the head and
uncinate process of the pancreas suggestive of chronic pancreatitis.

Spleen: Normal in size without focal abnormality.

Adrenals/Urinary Tract: Adrenal glands are within normal limits.
Kidneys appear edematous and there is moderate perinephric fat
stranding. There are multiple punctate nonobstructing stones within
the bilateral kidneys. No ureteral stone. Bladder is normal.

Stomach/Bowel: The stomach is nonenlarged. Multifocal areas of mild
colon wall thickening throughout the colon. Majority of the contrast
is in the colon. Fluid filled loops of nondilated small bowel with
suggestion of possible wall thickening. Normal appendix.

Vascular/Lymphatic: Aortic atherosclerosis. No enlarged abdominal or
pelvic lymph nodes.

Reproductive: Uterus and bilateral adnexa are unremarkable.

Other: No free air.

Musculoskeletal: No acute or significant osseous findings.
IMPRESSION: 1. Kidneys appear an edematous bilaterally and there is moderate
perinephric fat stranding, findings are nonspecific but could be due
to medical renal disease or possible pyelonephritis, clinical
correlation recommended. Multiple punctate stones within the
bilateral kidneys without ureteral stone identified.
2. Multifocal areas of mild wall thickening involving the colon.
There are fluid-filled loops of small bowel with suggested areas of
mild wall thickening, findings could relate to enteritis/colitis of
infectious or inflammatory etiology. There is no evidence for a
bowel obstruction. The appendix is normal.

## 2017-08-05 IMAGING — DX DG ABD PORTABLE 1V
1 series · 1 of 1 positions shown · non-contrast
Comparison: Abdominal CT dated 06/11/2016

CLINICAL DATA: 55-year-old female with abdominal pain, colitis, and
abdominal distention.

EXAM:
PORTABLE ABDOMEN - 1 VIEW

[abdomen kub]
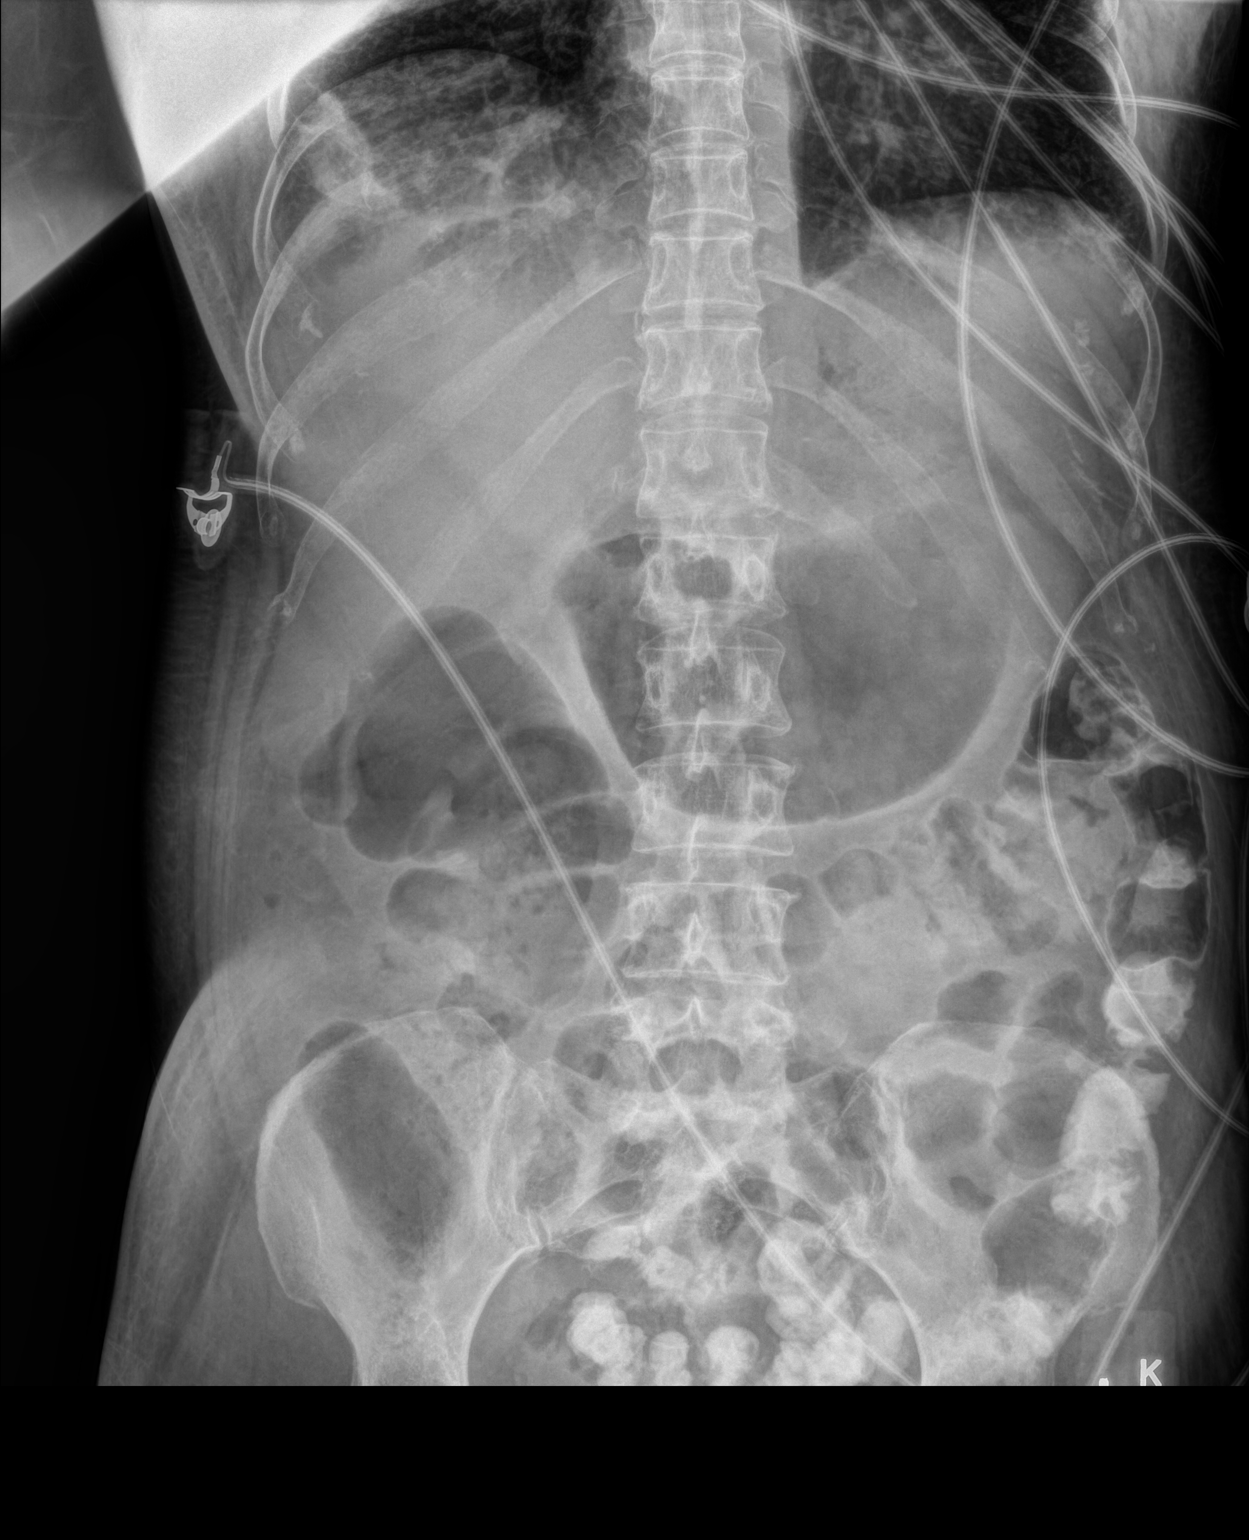

[1 of 1 positions shown; findings below may reference images not displayed]

FINDINGS: Air is noted within the stomach and throughout the colon. Oral
contrast from recent CT is seen in the distal colon and
rectosigmoid. There is no bowel dilatation or evidence of
obstruction. No free air or radiopaque calculi noted. The osseous
structures and the soft tissues are grossly unremarkable.

Right lung base linear densities likely atelectasis/scarring.
IMPRESSION: No bowel obstruction or free air.

## 2017-11-11 ENCOUNTER — Emergency Department (HOSPITAL_COMMUNITY): Payer: Self-pay

## 2017-11-11 ENCOUNTER — Encounter (HOSPITAL_COMMUNITY): Payer: Self-pay

## 2017-11-11 ENCOUNTER — Other Ambulatory Visit: Payer: Self-pay

## 2017-11-11 ENCOUNTER — Emergency Department (HOSPITAL_COMMUNITY)
Admission: EM | Admit: 2017-11-11 | Discharge: 2017-11-11 | Disposition: A | Discharge status: Home / Self Care | Payer: Self-pay | Attending: Physician Assistant | Admitting: Physician Assistant

## 2017-11-11 DIAGNOSIS — W19XXXA Unspecified fall, initial encounter: Secondary | ICD-10-CM

## 2017-11-11 DIAGNOSIS — M6281 Muscle weakness (generalized): Secondary | ICD-10-CM | POA: Insufficient documentation

## 2017-11-11 DIAGNOSIS — Z79899 Other long term (current) drug therapy: Secondary | ICD-10-CM | POA: Insufficient documentation

## 2017-11-11 DIAGNOSIS — Z7982 Long term (current) use of aspirin: Secondary | ICD-10-CM | POA: Insufficient documentation

## 2017-11-11 DIAGNOSIS — Z794 Long term (current) use of insulin: Secondary | ICD-10-CM | POA: Insufficient documentation

## 2017-11-11 DIAGNOSIS — F17210 Nicotine dependence, cigarettes, uncomplicated: Secondary | ICD-10-CM | POA: Insufficient documentation

## 2017-11-11 DIAGNOSIS — W1839XA Other fall on same level, initial encounter: Secondary | ICD-10-CM | POA: Insufficient documentation

## 2017-11-11 DIAGNOSIS — Y999 Unspecified external cause status: Secondary | ICD-10-CM | POA: Insufficient documentation

## 2017-11-11 DIAGNOSIS — Y9389 Activity, other specified: Secondary | ICD-10-CM | POA: Insufficient documentation

## 2017-11-11 DIAGNOSIS — I10 Essential (primary) hypertension: Secondary | ICD-10-CM | POA: Insufficient documentation

## 2017-11-11 DIAGNOSIS — Z8673 Personal history of transient ischemic attack (TIA), and cerebral infarction without residual deficits: Secondary | ICD-10-CM | POA: Insufficient documentation

## 2017-11-11 DIAGNOSIS — E119 Type 2 diabetes mellitus without complications: Secondary | ICD-10-CM | POA: Insufficient documentation

## 2017-11-11 DIAGNOSIS — Y92129 Unspecified place in nursing home as the place of occurrence of the external cause: Secondary | ICD-10-CM | POA: Insufficient documentation

## 2017-11-11 LAB — URINALYSIS, ROUTINE W REFLEX MICROSCOPIC
Bilirubin Urine: NEGATIVE
GLUCOSE, UA: 150 mg/dL — AB
Hgb urine dipstick: NEGATIVE
KETONES UR: NEGATIVE mg/dL
Nitrite: NEGATIVE
PROTEIN: 30 mg/dL — AB
Specific Gravity, Urine: 1.008 (ref 1.005–1.030)
pH: 8 (ref 5.0–8.0)

## 2017-11-11 LAB — COMPREHENSIVE METABOLIC PANEL
ALBUMIN: 4 g/dL (ref 3.5–5.0)
ALT: 15 U/L (ref 14–54)
ANION GAP: 15 (ref 5–15)
AST: 18 U/L (ref 15–41)
Alkaline Phosphatase: 93 U/L (ref 38–126)
BUN: 61 mg/dL — ABNORMAL HIGH (ref 6–20)
CO2: 22 mmol/L (ref 22–32)
Calcium: 9.2 mg/dL (ref 8.9–10.3)
Chloride: 104 mmol/L (ref 101–111)
Creatinine, Ser: 4.53 mg/dL — ABNORMAL HIGH (ref 0.44–1.00)
GFR calc Af Amer: 11 mL/min — ABNORMAL LOW (ref >60)
GFR calc non Af Amer: 10 mL/min — ABNORMAL LOW (ref >60)
GLUCOSE: 208 mg/dL — AB (ref 65–99)
POTASSIUM: 5 mmol/L (ref 3.5–5.1)
SODIUM: 141 mmol/L (ref 135–145)
TOTAL PROTEIN: 8.5 g/dL — AB (ref 6.5–8.1)
Total Bilirubin: 0.4 mg/dL (ref 0.3–1.2)

## 2017-11-11 LAB — RAPID URINE DRUG SCREEN, HOSP PERFORMED
AMPHETAMINES: NOT DETECTED
Barbiturates: NOT DETECTED
Benzodiazepines: NOT DETECTED
Cocaine: NOT DETECTED
OPIATES: NOT DETECTED
Tetrahydrocannabinol: NOT DETECTED

## 2017-11-11 LAB — CBC WITH DIFFERENTIAL/PLATELET
BASOS PCT: 0 %
Basophils Absolute: 0 10*3/uL (ref 0.0–0.1)
EOS ABS: 0.4 10*3/uL (ref 0.0–0.7)
Eosinophils Relative: 4 %
HCT: 31 % — ABNORMAL LOW (ref 36.0–46.0)
HEMOGLOBIN: 9.7 g/dL — AB (ref 12.0–15.0)
Lymphocytes Relative: 19 %
Lymphs Abs: 1.7 10*3/uL (ref 0.7–4.0)
MCH: 29.8 pg (ref 26.0–34.0)
MCHC: 31.3 g/dL (ref 30.0–36.0)
MCV: 95.1 fL (ref 78.0–100.0)
MONO ABS: 0.6 10*3/uL (ref 0.1–1.0)
MONOS PCT: 6 %
NEUTROS PCT: 71 %
Neutro Abs: 6.3 10*3/uL (ref 1.7–7.7)
Platelets: 353 10*3/uL (ref 150–400)
RBC: 3.26 MIL/uL — ABNORMAL LOW (ref 3.87–5.11)
RDW: 13 % (ref 11.5–15.5)
WBC: 9 10*3/uL (ref 4.0–10.5)

## 2017-11-11 LAB — I-STAT TROPONIN, ED: Troponin i, poc: 0 ng/mL (ref 0.00–0.08)

## 2017-11-11 LAB — ETHANOL

## 2017-11-11 LAB — TSH: TSH: 3.61 u[IU]/mL (ref 0.350–4.500)

## 2017-11-11 MED ORDER — CEPHALEXIN 500 MG PO CAPS: 500.0000 mg | ORAL_CAPSULE | Freq: Four times a day (QID) | ORAL | 0 refills | Status: DC

## 2017-11-11 NOTE — ED Notes (Addendum)
Patient was unable to ambulate very far or put much pressure on left leg. This RN had to carry patient 3 steps and lift her back into the bed as she could not use the step stool due to weakness.

## 2017-11-11 NOTE — ED Notes (Signed)
Patient transported to CT 

## 2017-11-11 NOTE — ED Triage Notes (Signed)
EMS states patient from [facility] from SNF states [place]

## 2017-11-11 NOTE — ED Notes (Signed)
Bed: WA21 Expected date:  Expected time:  Means of arrival:  Comments: EMS  

## 2017-11-11 NOTE — Discharge Instructions (Addendum)
We are unsure what caused your pain in your legs or your difficulty in getting around earlier today.  We are happy to report that your CT, x-ray, labs, EKG are all reassuring.  Please return with any focal weakness, or other concerns.   Your urine seem to show some evidence of infection, was sent for culture and we started you on antibiotics.  Recommend your facility that you be enrolled in physical therapy. You may need a further doctor's order from your primary care physician would recommend that you obtain.

## 2017-11-11 NOTE — ED Triage Notes (Signed)
Patient arrives from [facility] with complaints of going to the bathroom this am and "her legs just gave out"-EMS found patient in her bathroom. EMS states patient unable to bear weight to either leg-states pain left leg/right flank pain-patient told EMS she "fell straight down and didn't hit anything"-patient states she also had weakness to both arms yesterday.

## 2017-11-11 NOTE — ED Provider Notes (Addendum)
[facility] DEPT Provider Note   CSN: [id number] Arrival date & time: 11/11/17  0557     History   Chief Complaint Chief Complaint  Patient presents with  . Fall  . Right flank pain  . Left leg pain    HPI [name] Michael is a 57 y.o. female.  HPI   Patient is a 57 year old female resenting with "leg weakness.".  Patient reports that she was going to the bathroom and she was able to walk there.  And then she her knees just fell out.  Patient reports that she did have some weakness in her right arm she felt like yesterday.  When she is spilled a cup of coffee on herself.  She can't describe what happened, she just says her hand became weak. Patient denies any trauma with the fall.  She looked felt straight down to the floor and did not hit anything.  Patient was unsure whether is one or both legs.  Fairly difficult historian.  She denies any other recent symptoms.  Past Medical History:  Diagnosis Date  . Diabetes mellitus   . Hyperlipidemia   . Hypertension   . Stroke [facility]) 04-01-11   left frontal subcortical, saw Dr. [name]   . TIA (transient ischemic attack) 03-12-11    Patient Active Problem List   Diagnosis Date Noted  . AKI (acute kidney injury) ([place]) 11/09/2016  . Hypercalcemia 11/09/2016  . Hyponatremia 11/09/2016  . Hypovolemia 11/09/2016  . Accelerated hypertension 11/09/2016  . Acute lower UTI 11/09/2016  . Nephrolithiasis 10/24/2016  . Constipation 10/22/2016  . CAP (community acquired pneumonia) 10/22/2016  . Hydronephrosis of right kidney 10/22/2016  . Metabolic acidosis 24/40/1027  . CVA (cerebral vascular accident) ([place]) 07/03/2016  . HAP (hospital-acquired pneumonia) 07/03/2016  . Sepsis secondary to UTI ([place])   . UTI due to extended-spectrum beta lactamase (ESBL) producing Escherichia coli   . Acute pyelonephritis   . Bacteremia due to Escherichia coli   . Colitis, indeterminate   . Uncontrolled type 2 diabetes  mellitus with complication ([place])   . 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 associated with diabetes mellitus due to underlying condition ([place])   . Chronic diastolic CHF (congestive heart failure) ([place])   . Abdominal pain   . Colitis   . Hypophosphatemia 06/12/2016  . Hypokalemia 06/11/2016  . Hypocalcemia 06/11/2016  . Acute kidney injury superimposed on chronic kidney disease ([place]) 06/11/2016  . Proliferative diabetic retinopathy ([place]) 04/29/2016  . Closed fracture of left distal radius 01/28/2016  . Poor social situation 03/09/2013  . Depression 03/01/2013  . Abnormal mammogram 12/20/2012  . Retinal detachment 11/17/2012  . 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 ([place]) 25/36/6440  . Hyperlipidemia 02/09/2007  . Essential hypertension 02/09/2007  . GERD 02/09/2007    Past Surgical History:  Procedure Laterality Date  . OPEN REDUCTION INTERNAL FIXATION (ORIF) DISTAL RADIAL FRACTURE Left 01/28/2016   Procedure: OPEN REDUCTION INTERNAL FIXATION (ORIF) DISTAL RADIAL FRACTURE;  Surgeon: Iran Planas, MD;  Location: [facility];  Service: Orthopedics;  Laterality: Left;  . [place]     age 57     OB History   None      Home Medications    Prior to Admission medications   Medication Sig Start Date End Date Taking? Authorizing Provider  amLODipine (NORVASC) 10 MG tablet Take 1 tablet (10 mg total) by mouth daily.  11/22/16  Yes Ghimire, Henreitta Leber, MD  aspirin 81 MG tablet Take 1 tablet (81 mg total) by mouth daily. 08/24/12  Yes Reyne Dumas, MD  atorvastatin (LIPITOR) 40 MG tablet Take 40 mg by mouth daily.   Yes [provider]  calcium carbonate (TUMS - DOSED IN MG ELEMENTAL CALCIUM) 500 MG chewable tablet Chew 4 tablets (800 mg of elemental calcium total) by mouth 3 (three) times daily. Patient taking differently: Chew 800 mg of elemental calcium by mouth 3 (three)  times daily as needed for indigestion or heartburn.  11/21/16  Yes Ghimire, Henreitta Leber, MD  diphenhydrAMINE (BENADRYL) 25 MG tablet Take 25 mg by mouth every 4 (four) hours as needed for itching.   Yes [provider]  furosemide (LASIX) 20 MG tablet Take 20 mg by mouth daily.   Yes [provider]  gabapentin (NEURONTIN) 100 MG capsule Take 100 mg by mouth 2 (two) times daily.   Yes [provider]  guaifenesin (ROBITUSSIN) 100 MG/5ML syrup Take 100 mg by mouth every 4 (four) hours as needed for cough.   Yes [provider]  hydrALAZINE (APRESOLINE) 25 MG tablet Take 1 tablet (25 mg total) by mouth 3 (three) times daily. 11/21/16  Yes Ghimire, Henreitta Leber, MD  insulin aspart (NOVOLOG) 100 UNIT/ML injection Inject 8 Units into the skin 3 (three) times daily with meals. 11/21/16  Yes Ghimire, Henreitta Leber, MD  insulin glargine (LANTUS) 100 UNIT/ML injection Inject 0.24 mLs (24 Units total) into the skin daily. 11/22/16  Yes Ghimire, Henreitta Leber, MD  lidocaine (LIDODERM) 5 % Place 1 patch onto the skin daily. Remove & Discard patch within 12 hours or as directed by MD 11/21/16  Yes Ghimire, Henreitta Leber, MD  magnesium oxide (MAG-OX) 400 (241.3 Mg) MG tablet Take 1 tablet (400 mg total) by mouth 2 (two) times daily. 11/21/16  Yes Ghimire, Henreitta Leber, MD  metoprolol (LOPRESSOR) 100 MG tablet Take 100 mg by mouth 2 (two) times daily.   Yes [provider]  Multiple Vitamins-Minerals (CERTAVITE SENIOR/ANTIOXIDANT) TABS Take 1 tablet by mouth daily.   Yes [provider]  nystatin (NYSTATIN) powder Apply 1 g topically 2 (two) times daily.   Yes [provider]  omeprazole (PRILOSEC) 40 MG capsule Take 40 mg by mouth daily.   Yes [provider]  ondansetron (ZOFRAN ODT) 4 MG disintegrating tablet Take 1 tablet (4 mg total) by mouth every 8 (eight) hours as needed for nausea or vomiting. 10/14/16  Yes Street, [place], PA-C  polyethylene glycol (MIRALAX /  GLYCOLAX) packet Take 17 g by mouth daily. 11/22/16  Yes Ghimire, Henreitta Leber, MD  ranitidine (ZANTAC) 150 MG tablet Take 1 tablet (150 mg total) by mouth 2 (two) times daily. 10/14/16  Yes Street, [place], PA-C  Saccharomyces boulardii (PROBIOTIC) 250 MG CAPS Take 1 capsule by mouth 2 (two) times daily.   Yes [provider]  sodium bicarbonate 650 MG tablet Take 1 tablet (650 mg total) by mouth 2 (two) times daily. 06/30/16  Yes Patrecia Pour, MD  Vitamin D, Ergocalciferol, (DRISDOL) 50000 units CAPS capsule Take 1 capsule (50,000 Units total) by mouth every 7 (seven) days. 11/21/16  Yes Ghimire, Henreitta Leber, MD  atorvastatin (LIPITOR) 20 MG tablet Take 1 tablet (20 mg total) by mouth daily. Patient not taking: Reported on 03/19/2017 04/29/16   Charlott Rakes, MD  cephALEXin (KEFLEX) 500 MG capsule Take 1 capsule (500 mg total) by mouth 2 (two) times daily. Patient  not taking: Reported on 11/11/2017 03/20/17   Jeannett Senior, PA-C  cephALEXin (KEFLEX) 500 MG capsule Take 1 capsule (500 mg total) by mouth 4 (four) times daily. 11/11/17   Blaklee Shores Lyn, MD  feeding supplement (BOOST / RESOURCE BREEZE) LIQD Take 1 Container by mouth 3 (three) times daily between meals. Patient not taking: Reported on 03/19/2017 11/21/16   Jonetta Osgood, MD  furosemide (LASIX) 40 MG tablet Take 1 tablet (40 mg total) by mouth daily. Patient not taking: Reported on 03/19/2017 11/21/16   Jonetta Osgood, MD  ramipril (ALTACE) 10 MG capsule Take 1 capsule (10 mg total) by mouth daily. 08/24/12 09/07/12  Reyne Dumas, MD    Family History Family History  Problem Relation Age of Onset  . Stroke Mother   . Diabetes Mother   . Kidney failure Mother   . Heart failure Mother   . Stroke Father   . Cancer Sister        Breast- 67's    Social History Social History   Tobacco Use  . Smoking status: Current Every Day Smoker    Packs/day: 1.00    Years: 32.00    Pack years: 32.00    Types: Cigarettes     Last attempt to quit: 09/23/2012    Years since quitting: 5.1  . Smokeless tobacco: Never Used  . Tobacco comment: or less  Substance Use Topics  . Alcohol use: No  . Drug use: No     Allergies   Hydrocodone   Review of Systems Review of Systems  Constitutional: Negative for activity change.  Respiratory: Negative for shortness of breath.   Cardiovascular: Negative for chest pain.  Gastrointestinal: Negative for abdominal pain.  Neurological: Positive for weakness. Negative for syncope, facial asymmetry, speech difficulty and headaches.  All other systems reviewed and are negative.    Physical Exam Updated Vital Signs BP (!) 149/68 (BP Location: Right Arm)   Pulse 92   Temp 98.8 F (37.1 C) (Oral)   Resp 12   Ht 4\' 6"  (1.372 m)   Wt 46.3 kg (102 lb)   LMP 03/06/2013   SpO2 95%   BMI 24.59 kg/m   Physical Exam  Constitutional: She is oriented to person, place, and time. She appears well-developed and well-nourished.  HENT:  Head: Normocephalic and atraumatic.  Eyes: Right eye exhibits no discharge. Left eye exhibits no discharge.  Mildly proptotic.  Cardiovascular: Normal rate and regular rhythm.  No murmur heard. Pulmonary/Chest: Effort normal and breath sounds normal. No respiratory distress.  Neurological: She is oriented to person, place, and time.  Odd affect.  Patient is able to move all 4 extremities.  No evidence of pronator drift.  Cranial nerves appear intact.  Skin: Skin is warm and dry. She is not diaphoretic.  Psychiatric: She has a normal mood and affect.  Nursing note and vitals reviewed.    ED Treatments / Results  Labs (all labs ordered are listed, but only abnormal results are displayed) Labs Reviewed  CBC WITH DIFFERENTIAL/PLATELET - Abnormal; Notable for the following components:      Result Value   RBC 3.26 (*)    Hemoglobin 9.7 (*)    HCT 31.0 (*)    All other components within normal limits  COMPREHENSIVE METABOLIC PANEL -  Abnormal; Notable for the following components:   Glucose, Bld 208 (*)    BUN 61 (*)    Creatinine, Ser 4.53 (*)    Total Protein 8.5 (*)  GFR calc non Af Amer 10 (*)    GFR calc Af Amer 11 (*)    All other components within normal limits  URINALYSIS, ROUTINE W REFLEX MICROSCOPIC - Abnormal; Notable for the following components:   Color, Urine STRAW (*)    Glucose, UA 150 (*)    Protein, ur 30 (*)    Leukocytes, UA SMALL (*)    Bacteria, UA RARE (*)    All other components within normal limits  URINE CULTURE  TSH  RAPID URINE DRUG SCREEN, HOSP PERFORMED  ETHANOL  I-STAT TROPONIN, ED    EKG EKG Interpretation  Date/Time:  Wednesday Nov 11 2017 08:22:51 EDT Ventricular Rate:  87 PR Interval:    QRS Duration: 85 QT Interval:  388 QTC Calculation: 467 R Axis:   95 Text Interpretation:  Sinus rhythm Borderline right axis deviation Normal sinus rhythm Confirmed by Thomasene Lot, [place] [phone]) on 11/11/2017 8:58:40 AM Also confirmed by Thomasene Lot, Eyad Rochford [phone]), editor Lynder Parents [phone])  on 11/11/2017 9:22:13 AM   Radiology Dg Chest 2 View  Result Date: 11/11/2017 CLINICAL DATA:  Fall. EXAM: CHEST - 2 VIEW COMPARISON:  Chest x-ray dated Nov 08, 2016. FINDINGS: The heart size and mediastinal contours are within normal limits. Normal pulmonary vascularity. Linear atelectasis/scarring in the lingula. No focal consolidation, pleural effusion, or pneumothorax. No acute osseous abnormality. IMPRESSION: No active cardiopulmonary disease. Electronically Signed   By: Titus Dubin M.D.   On: 11/11/2017 09:40   Ct Head Wo Contrast  Result Date: 11/11/2017 CLINICAL DATA:  Altered level of consciousness. Left-sided leg in arm weakness. EXAM: CT HEAD WITHOUT CONTRAST TECHNIQUE: Contiguous axial images were obtained from the base of the skull through the vertex without intravenous contrast. COMPARISON:  02/28/2013 FINDINGS: Brain: There is no evidence for acute hemorrhage, hydrocephalus,  mass lesion, or abnormal extra-axial fluid collection. No definite CT evidence for acute infarction. Diffuse loss of parenchymal volume is consistent with atrophy. Patchy low attenuation in the deep hemispheric and periventricular white matter is nonspecific, but likely reflects chronic microvascular ischemic demyelination. Vascular: No hyperdense vessel or unexpected calcification. Skull: No evidence for fracture. No worrisome lytic or sclerotic lesion. Sinuses/Orbits: The visualized paranasal sinuses and mastoid air cells are clear. Visualized portions of the globes and intraorbital fat are unremarkable. Other: None. IMPRESSION: 1. No acute intracranial abnormality. 2. Atrophy with chronic small vessel white matter ischemic disease. Electronically Signed   By: Misty Stanley M.D.   On: 11/11/2017 11:24    Procedures Procedures (including critical care time)  Medications Ordered in ED Medications - No data to display   Initial Impression / Assessment and Plan / ED Course  I have reviewed the triage vital signs and the nursing notes.  Pertinent labs & imaging results that were available during my care of the patient were reviewed by me and considered in my medical decision making (see chart for details).      Patient is a 57 year old female resenting with "leg weakness.".  Patient reports that she was going to the bathroom and she was able to walk there.  And then she her knees just fell out.  Patient reports that she did have some weakness in her right arm she felt like yesterday.  When she is spilled a cup of coffee on herself.  She can't describe what happened, she just says her hand became weak. Patient denies any trauma with the fall.  She looked felt straight down to the floor and did not hit anything.  Patient was unsure whether is one or both legs.  Fairly difficult historian.  She denies any other recent symptoms.  8:07 AM Very unclear what happened or what patient's symptoms are.  We  will start with initial work-up of CAT scan, lab work, urine.  Patient has history of encephalopathy.  History of stroke.  11:44 AM Patient appears baseline.  She was able to ambulate.  At her lab work CAT scan all are reassuring.  Patient may be has a small urinary tract infection.  Will treat.  Discussed with facility.  Apparently the overnight team felt like she was at stating some concern over weakness of her both of her legs.  I talked to the day staff and they think that she has been complaining this for a number of years.  Indeed patient does not appear have anything acute on exam.  No focal weakness. Doubt stroke.  Will discharge back home to facility with outpatient follow-up.   11:48 AM Nurse ttempted to ambulate patient patient reports that the left leg feels weak and has pain.  And she is unable to ambulate.  Will oder MRI of patient and reeval  Unfortunately patient is just very unclear, very difficult to get any kind of history from her.  Is unclear whether it secondary to pain or weakness.  12:08 PM I aambulated the patient myself with the technician.  She did quite well.  She has chronic slowness, walks with a cane chronically.  Was able to ambulate without putting any weight on tech or I.  She has chronic pain bilateral lower extremities with no focality.  I am not sure this off patient's baseline especially in consideration of discussing this with patient's providers at her chronic care facility.  There is no focal weakness.  Will discharge.  She may benefit from physical therapy and we will write this for her facility. Final Clinical Impressions(s) / ED Diagnoses   Final diagnoses:  Fall, initial encounter    ED Discharge Orders        Ordered    cephALEXin (KEFLEX) 500 MG capsule  4 times daily     11/11/17 1130       Holland Nickson, Fredia Sorrow, MD 11/11/17 1145    Bradshaw Minihan, Fredia Sorrow, MD 11/11/17 1210

## 2017-11-11 NOTE — ED Notes (Signed)
[facility] called  

## 2017-11-13 LAB — URINE CULTURE

## 2018-03-10 ENCOUNTER — Emergency Department (HOSPITAL_COMMUNITY): Payer: Self-pay

## 2018-03-10 ENCOUNTER — Observation Stay (HOSPITAL_COMMUNITY): Payer: Self-pay

## 2018-03-10 ENCOUNTER — Inpatient Hospital Stay (HOSPITAL_COMMUNITY)
Admission: EM | Admit: 2018-03-10 | Discharge: 2018-03-13 | DRG: 689 | Disposition: A | Discharge status: Nursing Home (Includes ICF) | Payer: Self-pay | Attending: Internal Medicine | Admitting: Internal Medicine

## 2018-03-10 DIAGNOSIS — N390 Urinary tract infection, site not specified: Principal | ICD-10-CM | POA: Diagnosis present

## 2018-03-10 DIAGNOSIS — G9340 Encephalopathy, unspecified: Secondary | ICD-10-CM | POA: Diagnosis present

## 2018-03-10 DIAGNOSIS — I120 Hypertensive chronic kidney disease with stage 5 chronic kidney disease or end stage renal disease: Secondary | ICD-10-CM | POA: Diagnosis present

## 2018-03-10 DIAGNOSIS — Z794 Long term (current) use of insulin: Secondary | ICD-10-CM

## 2018-03-10 DIAGNOSIS — Z823 Family history of stroke: Secondary | ICD-10-CM

## 2018-03-10 DIAGNOSIS — Z8249 Family history of ischemic heart disease and other diseases of the circulatory system: Secondary | ICD-10-CM

## 2018-03-10 DIAGNOSIS — E875 Hyperkalemia: Secondary | ICD-10-CM | POA: Diagnosis present

## 2018-03-10 DIAGNOSIS — Z7982 Long term (current) use of aspirin: Secondary | ICD-10-CM

## 2018-03-10 DIAGNOSIS — Z23 Encounter for immunization: Secondary | ICD-10-CM

## 2018-03-10 DIAGNOSIS — H00016 Hordeolum externum left eye, unspecified eyelid: Secondary | ICD-10-CM | POA: Diagnosis present

## 2018-03-10 DIAGNOSIS — Z885 Allergy status to narcotic agent status: Secondary | ICD-10-CM

## 2018-03-10 DIAGNOSIS — Z8673 Personal history of transient ischemic attack (TIA), and cerebral infarction without residual deficits: Secondary | ICD-10-CM

## 2018-03-10 DIAGNOSIS — E11311 Type 2 diabetes mellitus with unspecified diabetic retinopathy with macular edema: Secondary | ICD-10-CM | POA: Diagnosis present

## 2018-03-10 DIAGNOSIS — Z833 Family history of diabetes mellitus: Secondary | ICD-10-CM

## 2018-03-10 DIAGNOSIS — N185 Chronic kidney disease, stage 5: Secondary | ICD-10-CM | POA: Diagnosis present

## 2018-03-10 DIAGNOSIS — E08311 Diabetes mellitus due to underlying condition with unspecified diabetic retinopathy with macular edema: Secondary | ICD-10-CM | POA: Diagnosis present

## 2018-03-10 DIAGNOSIS — G9341 Metabolic encephalopathy: Secondary | ICD-10-CM | POA: Diagnosis present

## 2018-03-10 DIAGNOSIS — Z841 Family history of disorders of kidney and ureter: Secondary | ICD-10-CM

## 2018-03-10 DIAGNOSIS — H547 Unspecified visual loss: Secondary | ICD-10-CM | POA: Diagnosis present

## 2018-03-10 DIAGNOSIS — Z8781 Personal history of (healed) traumatic fracture: Secondary | ICD-10-CM

## 2018-03-10 DIAGNOSIS — R4689 Other symptoms and signs involving appearance and behavior: Secondary | ICD-10-CM

## 2018-03-10 DIAGNOSIS — E1122 Type 2 diabetes mellitus with diabetic chronic kidney disease: Secondary | ICD-10-CM | POA: Diagnosis present

## 2018-03-10 DIAGNOSIS — N179 Acute kidney failure, unspecified: Secondary | ICD-10-CM | POA: Diagnosis present

## 2018-03-10 DIAGNOSIS — Z79899 Other long term (current) drug therapy: Secondary | ICD-10-CM

## 2018-03-10 DIAGNOSIS — E785 Hyperlipidemia, unspecified: Secondary | ICD-10-CM | POA: Diagnosis present

## 2018-03-10 DIAGNOSIS — I10 Essential (primary) hypertension: Secondary | ICD-10-CM | POA: Diagnosis present

## 2018-03-10 LAB — URINALYSIS, ROUTINE W REFLEX MICROSCOPIC
Bilirubin Urine: NEGATIVE
GLUCOSE, UA: NEGATIVE mg/dL
KETONES UR: NEGATIVE mg/dL
NITRITE: NEGATIVE
PH: 8 (ref 5.0–8.0)
PROTEIN: NEGATIVE mg/dL
Specific Gravity, Urine: 1.004 — ABNORMAL LOW (ref 1.005–1.030)

## 2018-03-10 LAB — CBC WITH DIFFERENTIAL/PLATELET
BASOS PCT: 0 %
Basophils Absolute: 0 10*3/uL (ref 0.0–0.1)
Eosinophils Absolute: 0.5 10*3/uL (ref 0.0–0.7)
Eosinophils Relative: 5 %
HEMATOCRIT: 30.1 % — AB (ref 36.0–46.0)
HEMOGLOBIN: 9.6 g/dL — AB (ref 12.0–15.0)
LYMPHS ABS: 2.2 10*3/uL (ref 0.7–4.0)
LYMPHS PCT: 25 %
MCH: 29.9 pg (ref 26.0–34.0)
MCHC: 31.9 g/dL (ref 30.0–36.0)
MCV: 93.8 fL (ref 78.0–100.0)
MONOS PCT: 6 %
Monocytes Absolute: 0.6 10*3/uL (ref 0.1–1.0)
NEUTROS ABS: 5.8 10*3/uL (ref 1.7–7.7)
NEUTROS PCT: 64 %
Platelets: 413 10*3/uL — ABNORMAL HIGH (ref 150–400)
RBC: 3.21 MIL/uL — ABNORMAL LOW (ref 3.87–5.11)
RDW: 13.1 % (ref 11.5–15.5)
WBC: 9 10*3/uL (ref 4.0–10.5)

## 2018-03-10 LAB — COMPREHENSIVE METABOLIC PANEL
ALT: 12 U/L (ref 0–44)
AST: 12 U/L — AB (ref 15–41)
Albumin: 4 g/dL (ref 3.5–5.0)
Alkaline Phosphatase: 87 U/L (ref 38–126)
Anion gap: 12 (ref 5–15)
BILIRUBIN TOTAL: 0.4 mg/dL (ref 0.3–1.2)
BUN: 53 mg/dL — AB (ref 6–20)
CHLORIDE: 102 mmol/L (ref 98–111)
CO2: 26 mmol/L (ref 22–32)
Calcium: 9.3 mg/dL (ref 8.9–10.3)
Creatinine, Ser: 4.28 mg/dL — ABNORMAL HIGH (ref 0.44–1.00)
GFR, EST AFRICAN AMERICAN: 12 mL/min — AB (ref >60)
GFR, EST NON AFRICAN AMERICAN: 11 mL/min — AB (ref >60)
GLUCOSE: 107 mg/dL — AB (ref 70–99)
POTASSIUM: 5.4 mmol/L — AB (ref 3.5–5.1)
Sodium: 140 mmol/L (ref 135–145)
TOTAL PROTEIN: 8.4 g/dL — AB (ref 6.5–8.1)

## 2018-03-10 LAB — I-STAT CG4 LACTIC ACID, ED: Lactic Acid, Venous: 0.99 mmol/L (ref 0.5–1.9)

## 2018-03-10 LAB — I-STAT TROPONIN, ED: Troponin i, poc: 0 ng/mL (ref 0.00–0.08)

## 2018-03-10 LAB — AMMONIA: Ammonia: 27 umol/L (ref 9–35)

## 2018-03-10 LAB — MAGNESIUM: MAGNESIUM: 3.4 mg/dL — AB (ref 1.7–2.4)

## 2018-03-10 LAB — LIPASE, BLOOD: Lipase: 32 U/L (ref 11–51)

## 2018-03-10 LAB — GLUCOSE, CAPILLARY: Glucose-Capillary: 63 mg/dL — ABNORMAL LOW (ref 70–99)

## 2018-03-10 MED ORDER — AMLODIPINE BESYLATE 10 MG PO TABS
10.0000 mg | ORAL_TABLET | Freq: Every day | ORAL | Status: DC
  Administered 2018-03-11 – 2018-03-13 (×3): 10 mg via ORAL
  Filled 2018-03-10 (×2): qty 2
  Filled 2018-03-10: qty 1

## 2018-03-10 MED ORDER — ACETAMINOPHEN 650 MG RE SUPP: 650.0000 mg | Freq: Four times a day (QID) | RECTAL | Status: DC | PRN

## 2018-03-10 MED ORDER — METOPROLOL TARTRATE 50 MG PO TABS
100.0000 mg | ORAL_TABLET | Freq: Two times a day (BID) | ORAL | Status: DC
  Administered 2018-03-10 – 2018-03-13 (×6): 100 mg via ORAL
  Filled 2018-03-10 (×2): qty 4
  Filled 2018-03-10: qty 2
  Filled 2018-03-10 (×2): qty 4
  Filled 2018-03-10: qty 2

## 2018-03-10 MED ORDER — ASPIRIN EC 81 MG PO TBEC
81.0000 mg | DELAYED_RELEASE_TABLET | Freq: Every day | ORAL | Status: DC
  Administered 2018-03-10 – 2018-03-13 (×4): 81 mg via ORAL
  Filled 2018-03-10 (×4): qty 1

## 2018-03-10 MED ORDER — SODIUM CHLORIDE 0.9 % IV SOLN
INTRAVENOUS | Status: DC | PRN
  Administered 2018-03-11: 1000 mL via INTRAVENOUS

## 2018-03-10 MED ORDER — FAMOTIDINE 20 MG PO TABS
20.0000 mg | ORAL_TABLET | Freq: Two times a day (BID) | ORAL | Status: DC
  Administered 2018-03-10 – 2018-03-12 (×4): 20 mg via ORAL
  Filled 2018-03-10 (×4): qty 1

## 2018-03-10 MED ORDER — POLYETHYLENE GLYCOL 3350 17 G PO PACK
17.0000 g | PACK | Freq: Every day | ORAL | Status: DC
  Administered 2018-03-11: 17 g via ORAL
  Filled 2018-03-10 (×2): qty 1

## 2018-03-10 MED ORDER — INSULIN ASPART 100 UNIT/ML ~~LOC~~ SOLN
0.0000 [IU] | SUBCUTANEOUS | Status: DC
  Administered 2018-03-11: 1 [IU] via SUBCUTANEOUS
  Administered 2018-03-11 (×2): 2 [IU] via SUBCUTANEOUS
  Administered 2018-03-12: 1 [IU] via SUBCUTANEOUS
  Administered 2018-03-12: 5 [IU] via SUBCUTANEOUS
  Administered 2018-03-12 – 2018-03-13 (×5): 1 [IU] via SUBCUTANEOUS

## 2018-03-10 MED ORDER — SODIUM BICARBONATE 650 MG PO TABS
650.0000 mg | ORAL_TABLET | Freq: Two times a day (BID) | ORAL | Status: DC
  Administered 2018-03-10 – 2018-03-13 (×6): 650 mg via ORAL
  Filled 2018-03-10 (×6): qty 1

## 2018-03-10 MED ORDER — SODIUM CHLORIDE 0.9 % IV SOLN
1.0000 g | INTRAVENOUS | Status: DC
  Administered 2018-03-11: 1 g via INTRAVENOUS
  Filled 2018-03-10: qty 1

## 2018-03-10 MED ORDER — HYDRALAZINE HCL 25 MG PO TABS
25.0000 mg | ORAL_TABLET | Freq: Three times a day (TID) | ORAL | Status: DC
  Administered 2018-03-10 – 2018-03-13 (×8): 25 mg via ORAL
  Filled 2018-03-10 (×8): qty 1

## 2018-03-10 MED ORDER — INSULIN GLARGINE 100 UNIT/ML ~~LOC~~ SOLN
20.0000 [IU] | Freq: Every day | SUBCUTANEOUS | Status: DC
  Administered 2018-03-11 – 2018-03-13 (×3): 20 [IU] via SUBCUTANEOUS
  Filled 2018-03-10 (×3): qty 0.2

## 2018-03-10 MED ORDER — ONDANSETRON HCL 4 MG/2ML IJ SOLN: 4.0000 mg | Freq: Four times a day (QID) | INTRAMUSCULAR | Status: DC | PRN

## 2018-03-10 MED ORDER — ATORVASTATIN CALCIUM 40 MG PO TABS
40.0000 mg | ORAL_TABLET | Freq: Every day | ORAL | Status: DC
  Administered 2018-03-11 – 2018-03-12 (×2): 40 mg via ORAL
  Filled 2018-03-10 (×2): qty 1

## 2018-03-10 MED ORDER — SODIUM CHLORIDE 0.9 % IV SOLN
1.0000 g | Freq: Once | INTRAVENOUS | Status: AC
  Administered 2018-03-10: 1 g via INTRAVENOUS
  Filled 2018-03-10: qty 10

## 2018-03-10 MED ORDER — GABAPENTIN 100 MG PO CAPS
100.0000 mg | ORAL_CAPSULE | Freq: Two times a day (BID) | ORAL | Status: DC
  Administered 2018-03-10 – 2018-03-13 (×6): 100 mg via ORAL
  Filled 2018-03-10 (×6): qty 1

## 2018-03-10 MED ORDER — ACETAMINOPHEN 325 MG PO TABS: 650.0000 mg | ORAL_TABLET | Freq: Four times a day (QID) | ORAL | Status: DC | PRN

## 2018-03-10 MED ORDER — ONDANSETRON HCL 4 MG PO TABS: 4.0000 mg | ORAL_TABLET | Freq: Four times a day (QID) | ORAL | Status: DC | PRN

## 2018-03-10 MED ORDER — SODIUM CHLORIDE 0.9 % IV BOLUS
1000.0000 mL | Freq: Once | INTRAVENOUS | Status: AC
  Administered 2018-03-10: 1000 mL via INTRAVENOUS

## 2018-03-10 MED ORDER — CALCIUM CARBONATE ANTACID 500 MG PO CHEW: 800.0000 mg | CHEWABLE_TABLET | Freq: Three times a day (TID) | ORAL | Status: DC | PRN

## 2018-03-10 MED ORDER — ADULT MULTIVITAMIN W/MINERALS CH
1.0000 | ORAL_TABLET | Freq: Every day | ORAL | Status: DC
  Administered 2018-03-11 – 2018-03-13 (×3): 1 via ORAL
  Filled 2018-03-10 (×3): qty 1

## 2018-03-10 MED ORDER — PANTOPRAZOLE SODIUM 40 MG PO TBEC
80.0000 mg | DELAYED_RELEASE_TABLET | Freq: Every day | ORAL | Status: DC
  Administered 2018-03-11 – 2018-03-13 (×3): 80 mg via ORAL
  Filled 2018-03-10 (×3): qty 2

## 2018-03-10 MED ORDER — LIDOCAINE 5 % EX PTCH
1.0000 | MEDICATED_PATCH | CUTANEOUS | Status: DC
  Administered 2018-03-10 – 2018-03-11 (×2): 1 via TRANSDERMAL
  Filled 2018-03-10 (×3): qty 1

## 2018-03-10 MED ORDER — HEPARIN SODIUM (PORCINE) 5000 UNIT/ML IJ SOLN
5000.0000 [IU] | Freq: Three times a day (TID) | INTRAMUSCULAR | Status: DC
  Administered 2018-03-10 – 2018-03-13 (×8): 5000 [IU] via SUBCUTANEOUS
  Filled 2018-03-10 (×8): qty 1

## 2018-03-10 NOTE — ED Notes (Signed)
ED TO INPATIENT HANDOFF REPORT  Name/Age/Gender [name] Michael 57 y.o. female  Code Status    Code Status Orders  (From admission, onward)         Start     Ordered   03/10/18 2200  Full code  Continuous     03/10/18 2200        Code Status History    Date Active Date Inactive Code Status Order ID Comments User Context   11/09/2016 0359 11/21/2016 1705 Full Code [id number]  Lily Kocher, MD ED   10/23/2016 0004 10/24/2016 2257 Full Code [id number]  Etta Quill, DO ED   06/11/2016 2013 07/01/2016 2348 DNR [id number]  Velvet Bathe, MD Inpatient   01/28/2016 2113 01/29/2016 2038 Full Code [id number]  Iran Planas, MD Inpatient   02/28/2013 2213 03/01/2013 1719 Full Code [id number]  Larene Pickett, PA-C ED      Home/SNF/Other Nursing Home  Chief Complaint AMS  Level of Care/Admitting Diagnosis ED Disposition    ED Disposition Condition [facility] Area: [facility] [[id number]]  Level of Care: Med-Surg [16]  Diagnosis: Acute lower UTI [498264]  Admitting Physician: Etta Quill [[id number]]  Attending Physician: Etta Quill [[id number]]  PT Class (Do Not Modify): Observation [104]  PT Acc Code (Do Not Modify): Observation [10022]       Medical History Past Medical History:  Diagnosis Date  . Diabetes mellitus   . Hyperlipidemia   . Hypertension   . 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 [name]   . TIA (transient ischemic attack) 03-12-11    Allergies Allergies  Allergen Reactions  . Hydrocodone Nausea And Vomiting    IV Location/Drains/Wounds Patient Lines/Drains/Airways Status   Active Line/Drains/Airways    Name:   Placement date:   Placement time:   Site:   Days:   Peripheral IV 03/10/18 Left Antecubital   03/10/18    2025    Antecubital   less than 1   Incision (Closed) 01/28/16 Arm Left   01/28/16    1944     772          Labs/Imaging Results for orders placed or performed during the hospital encounter of  03/10/18 (from the past 48 hour(s))  Urinalysis, Routine w reflex microscopic- may I&O cath if menses     Status: Abnormal   Collection Time: 03/10/18  8:27 PM  Result Value Ref Range   Color, Urine STRAW (A) YELLOW   APPearance CLEAR CLEAR   Specific Gravity, Urine 1.004 (L) 1.005 - 1.030   pH 8.0 5.0 - 8.0   Glucose, UA NEGATIVE NEGATIVE mg/dL   Hgb urine dipstick SMALL (A) NEGATIVE   Bilirubin Urine NEGATIVE NEGATIVE   Ketones, ur NEGATIVE NEGATIVE mg/dL   Protein, ur NEGATIVE NEGATIVE mg/dL   Nitrite NEGATIVE NEGATIVE   Leukocytes, UA LARGE (A) NEGATIVE   RBC / HPF 0-5 0 - 5 RBC/hpf   WBC, UA 21-50 0 - 5 WBC/hpf   Bacteria, UA RARE (A) NONE SEEN   Squamous Epithelial / LPF 0-5 0 - 5    Comment: Performed at [facility], [place] [address], [place], [place] [address]  Comprehensive metabolic panel     Status: Abnormal   Collection Time: 03/10/18  8:27 PM  Result Value Ref Range   Sodium 140 135 - 145 mmol/L   Potassium 5.4 (H) 3.5 - 5.1 mmol/L   Chloride 102 98 - 111 mmol/L  CO2 26 22 - 32 mmol/L   Glucose, Bld 107 (H) 70 - 99 mg/dL   BUN 53 (H) 6 - 20 mg/dL   Creatinine, Ser 4.28 (H) 0.44 - 1.00 mg/dL   Calcium 9.3 8.9 - 10.3 mg/dL   Total Protein 8.4 (H) 6.5 - 8.1 g/dL   Albumin 4.0 3.5 - 5.0 g/dL   AST 12 (L) 15 - 41 U/L   ALT 12 0 - 44 U/L   Alkaline Phosphatase 87 38 - 126 U/L   Total Bilirubin 0.4 0.3 - 1.2 mg/dL   GFR calc non Af Amer 11 (L) >60 mL/min   GFR calc Af Amer 12 (L) >60 mL/min    Comment: (NOTE) The eGFR has been calculated using the CKD EPI equation. This calculation has not been validated in all clinical situations. eGFR's persistently <60 mL/min signify possible Chronic Kidney Disease.    Anion gap 12 5 - 15    Comment: Performed at [facility], [place] [address] [place], [place] [address]  Lipase, blood     Status: None   Collection Time: 03/10/18  8:27 PM  Result Value Ref Range   Lipase 32 11 - 51 U/L     Comment: Performed at [facility], [place] [address], [place], [place] [address]  CBC with Differential     Status: Abnormal   Collection Time: 03/10/18  8:27 PM  Result Value Ref Range   WBC 9.0 4.0 - 10.5 K/uL   RBC 3.21 (L) 3.87 - 5.11 MIL/uL   Hemoglobin 9.6 (L) 12.0 - 15.0 g/dL   HCT 30.1 (L) 36.0 - 46.0 %   MCV 93.8 78.0 - 100.0 fL   MCH 29.9 26.0 - 34.0 pg   MCHC 31.9 30.0 - 36.0 g/dL   RDW 13.1 11.5 - 15.5 %   Platelets 413 (H) 150 - 400 K/uL   Neutrophils Relative % 64 %   Neutro Abs 5.8 1.7 - 7.7 K/uL   Lymphocytes Relative 25 %   Lymphs Abs 2.2 0.7 - 4.0 K/uL   Monocytes Relative 6 %   Monocytes Absolute 0.6 0.1 - 1.0 K/uL   Eosinophils Relative 5 %   Eosinophils Absolute 0.5 0.0 - 0.7 K/uL   Basophils Relative 0 %   Basophils Absolute 0.0 0.0 - 0.1 K/uL    Comment: Performed at [facility], [place] [address] [place], [place] [address]  Ammonia     Status: None   Collection Time: 03/10/18  8:27 PM  Result Value Ref Range   Ammonia 27 9 - 35 umol/L    Comment: Performed at [facility], [place] [address], [place], [place] [address]  I-stat troponin, ED     Status: None   Collection Time: 03/10/18  8:32 PM  Result Value Ref Range   Troponin i, poc 0.00 0.00 - 0.08 ng/mL   Comment 3            Comment: Due to the release kinetics of cTnI, a negative result within the first hours of the onset of symptoms does not rule out myocardial infarction with certainty. If myocardial infarction is still suspected, repeat the test at appropriate intervals.   I-Stat CG4 Lactic Acid, ED     Status: None   Collection Time: 03/10/18  8:34 PM  Result Value Ref Range   Lactic Acid, Venous 0.99 0.5 - 1.9 mmol/L   Dg Chest 1 View  Result Date: 03/10/2018 CLINICAL DATA:  Altered mental status, lethargy and fatigue. EXAM: CHEST  1 VIEW COMPARISON:  None. FINDINGS: The heart size and mediastinal contours are within normal limits.  There is no evidence of pulmonary edema, consolidation, pneumothorax, nodule or pleural fluid. The visualized skeletal structures are unremarkable. IMPRESSION: No active disease. Electronically Signed   By: Aletta Edouard M.D.   On: 03/10/2018 21:32   Ct Head Wo Contrast  Result Date: 03/10/2018 CLINICAL DATA:  Altered mental status, lethargy and fatigue. EXAM: CT HEAD WITHOUT CONTRAST TECHNIQUE: Contiguous axial images were obtained from the base of the skull through the vertex without intravenous contrast. COMPARISON:  11/11/2017 FINDINGS: Brain: Stable chronic small vessel ischemic disease in the periventricular white matter and old lacunar infarcts in both basal ganglia. The brain demonstrates no evidence of hemorrhage, acute infarction, edema, mass effect, extra-axial fluid collection, hydrocephalus or mass lesion. Vascular: No hyperdense vessel or unexpected calcification. Skull: Normal. Negative for fracture or focal lesion. Sinuses/Orbits: No acute finding. Other: None. IMPRESSION: Stable chronic small vessel disease and bilateral old lacunar infarcts in the basal ganglia. No acute findings by head CT. Electronically Signed   By: Aletta Edouard M.D.   On: 03/10/2018 21:54    Pending Labs Unresulted Labs (From admission, onward)    Start     Ordered   03/11/18 0500  CBC  Tomorrow morning,   R     03/10/18 2200   03/11/18 2244  Basic metabolic panel  Tomorrow morning,   R     03/10/18 2200   03/10/18 2209  HIV Antibody (routine testing w rflx)  Once,   R     03/10/18 2209   03/10/18 2157  Magnesium  Once,   R     03/10/18 2156   03/10/18 2138  Urine culture  STAT,   STAT    Question:  Patient immune status  Answer:  Normal   03/10/18 2138   03/10/18 2006  Culture, blood (routine x 2)  BLOOD CULTURE X 2,   STAT     03/10/18 2007          Vitals/Pain Today's Vitals   03/10/18 1925 03/10/18 1929 03/10/18 1936 03/10/18 2000  BP:   134/70 106/89  Pulse:   71 70  Resp:   17 17   Temp:   97.7 F (36.5 C)   TempSrc:   Oral   SpO2: 95%  97% 100%  PainSc:  7       Isolation Precautions No active isolations  Medications Medications  cefTRIAXone (ROCEPHIN) 1 g in sodium chloride 0.9 % 100 mL IVPB (1 g Intravenous New Bag/Given 03/10/18 2158)  cefTRIAXone (ROCEPHIN) 1 g in sodium chloride 0.9 % 100 mL IVPB (has no administration in time range)  amLODipine (NORVASC) tablet 10 mg (has no administration in time range)  aspirin tablet 81 mg (has no administration in time range)  atorvastatin (LIPITOR) tablet 40 mg (has no administration in time range)  calcium carbonate (TUMS - dosed in mg elemental calcium) chewable tablet 800 mg of elemental calcium (has no administration in time range)  gabapentin (NEURONTIN) capsule 100 mg (has no administration in time range)  hydrALAZINE (APRESOLINE) tablet 25 mg (has no administration in time range)  sodium bicarbonate tablet 650 mg (has no administration in time range)  metoprolol tartrate (LOPRESSOR) tablet 100 mg (has no administration in time range)  CERTAVITE SENIOR/ANTIOXIDANT TABS 1 tablet (has no administration in time range)  pantoprazole (PROTONIX) EC tablet 80 mg (has no administration  in time range)  lidocaine (LIDODERM) 5 % 1 patch (has no administration in time range)  polyethylene glycol (MIRALAX / GLYCOLAX) packet 17 g (has no administration in time range)  famotidine (PEPCID) tablet 20 mg (has no administration in time range)  insulin glargine (LANTUS) injection 20 Units (has no administration in time range)  insulin aspart (novoLOG) injection 0-9 Units (has no administration in time range)  acetaminophen (TYLENOL) tablet 650 mg (has no administration in time range)    Or  acetaminophen (TYLENOL) suppository 650 mg (has no administration in time range)  ondansetron (ZOFRAN) tablet 4 mg (has no administration in time range)    Or  ondansetron (ZOFRAN) injection 4 mg (has no administration in time range)   heparin injection 5,000 Units (has no administration in time range)  sodium chloride 0.9 % bolus 1,000 mL (1,000 mLs Intravenous New Bag/Given 03/10/18 2026)    Mobility Walks normally

## 2018-03-10 NOTE — ED Provider Notes (Signed)
[facility] DEPT Provider Note   CSN: [id number] Arrival date & time: 03/10/18  1908     History   Chief Complaint Chief Complaint  Patient presents with  . Altered Mental Status    HPI [name] Michael is a 57 y.o. female.  Level 5 caveat secondary to altered mental status.  She is a resident at [facility].  She was brought in today for being lethargic.  Patient herself is difficult to get any story from but she is complaining of pain in her left eye and feeling tired and some itchiness in her groin.  She denies any chest pain or shortness of breath.  No headache.  Denies trauma.  No fever or chills noted. The history is provided by the patient.  Altered Mental Status   This is a new problem. The current episode started 6 to 12 hours ago. The problem has not changed since onset.Associated symptoms include somnolence and weakness. Her past medical history is significant for diabetes, CVA and hypertension.    Past Medical History:  Diagnosis Date  . Diabetes mellitus   . Hyperlipidemia   . Hypertension   . Stroke [facility]) 04-01-11   left frontal subcortical, saw Dr. [name]   . TIA (transient ischemic attack) 03-12-11    Patient Active Problem List   Diagnosis Date Noted  . AKI (acute kidney injury) ([place]) 11/09/2016  . Hypercalcemia 11/09/2016  . Hyponatremia 11/09/2016  . Hypovolemia 11/09/2016  . Accelerated hypertension 11/09/2016  . Acute lower UTI 11/09/2016  . Nephrolithiasis 10/24/2016  . Constipation 10/22/2016  . CAP (community acquired pneumonia) 10/22/2016  . Hydronephrosis of right kidney 10/22/2016  . Metabolic acidosis 97/67/3419  . CVA (cerebral vascular accident) ([place]) 07/03/2016  . HAP (hospital-acquired pneumonia) 07/03/2016  . Sepsis secondary to UTI ([place])   . UTI due to extended-spectrum beta lactamase (ESBL) producing Escherichia coli   . Acute pyelonephritis   . Bacteremia due to Escherichia coli   . Colitis, indeterminate     . Uncontrolled type 2 diabetes mellitus with complication ([place])   . 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 associated with diabetes mellitus due to underlying condition ([place])   . Chronic diastolic CHF (congestive heart failure) ([place])   . Abdominal pain   . Colitis   . Hypophosphatemia 06/12/2016  . Hypokalemia 06/11/2016  . Hypocalcemia 06/11/2016  . Acute kidney injury superimposed on chronic kidney disease ([place]) 06/11/2016  . Proliferative diabetic retinopathy ([place]) 04/29/2016  . Closed fracture of left distal radius 01/28/2016  . Poor social situation 03/09/2013  . Depression 03/01/2013  . Abnormal mammogram 12/20/2012  . Retinal detachment 11/17/2012  . 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 ([place]) 37/90/2409  . Hyperlipidemia 02/09/2007  . Essential hypertension 02/09/2007  . GERD 02/09/2007    Past Surgical History:  Procedure Laterality Date  . OPEN REDUCTION INTERNAL FIXATION (ORIF) DISTAL RADIAL FRACTURE Left 01/28/2016   Procedure: OPEN REDUCTION INTERNAL FIXATION (ORIF) DISTAL RADIAL FRACTURE;  Surgeon: Iran Planas, MD;  Location: [facility];  Service: Orthopedics;  Laterality: Left;  . [place]     age 55     OB History   None      Home Medications    Prior to Admission medications   Medication Sig Start Date End Date Taking? Authorizing Provider  amLODipine (NORVASC) 10 MG tablet Take 1 tablet (10 mg total) by mouth daily.  11/22/16   Ghimire, Henreitta Leber, MD  aspirin 81 MG tablet Take 1 tablet (81 mg total) by mouth daily. 08/24/12   Reyne Dumas, MD  atorvastatin (LIPITOR) 20 MG tablet Take 1 tablet (20 mg total) by mouth daily. Patient not taking: Reported on 03/19/2017 04/29/16   Charlott Rakes, MD  atorvastatin (LIPITOR) 40 MG tablet Take 40 mg by mouth daily.    [provider]  calcium carbonate (TUMS - DOSED IN MG ELEMENTAL CALCIUM) 500 MG  chewable tablet Chew 4 tablets (800 mg of elemental calcium total) by mouth 3 (three) times daily. Patient taking differently: Chew 800 mg of elemental calcium by mouth 3 (three) times daily as needed for indigestion or heartburn.  11/21/16   Ghimire, Henreitta Leber, MD  cephALEXin (KEFLEX) 500 MG capsule Take 1 capsule (500 mg total) by mouth 2 (two) times daily. Patient not taking: Reported on 11/11/2017 03/20/17   Jeannett Senior, PA-C  cephALEXin (KEFLEX) 500 MG capsule Take 1 capsule (500 mg total) by mouth 4 (four) times daily. 11/11/17   Mackuen, Courteney Lyn, MD  diphenhydrAMINE (BENADRYL) 25 MG tablet Take 25 mg by mouth every 4 (four) hours as needed for itching.    [provider]  feeding supplement (BOOST / RESOURCE BREEZE) LIQD Take 1 Container by mouth 3 (three) times daily between meals. Patient not taking: Reported on 03/19/2017 11/21/16   Jonetta Osgood, MD  furosemide (LASIX) 20 MG tablet Take 20 mg by mouth daily.    [provider]  furosemide (LASIX) 40 MG tablet Take 1 tablet (40 mg total) by mouth daily. Patient not taking: Reported on 03/19/2017 11/21/16   Jonetta Osgood, MD  gabapentin (NEURONTIN) 100 MG capsule Take 100 mg by mouth 2 (two) times daily.    [provider]  guaifenesin (ROBITUSSIN) 100 MG/5ML syrup Take 100 mg by mouth every 4 (four) hours as needed for cough.    [provider]  hydrALAZINE (APRESOLINE) 25 MG tablet Take 1 tablet (25 mg total) by mouth 3 (three) times daily. 11/21/16   Ghimire, Henreitta Leber, MD  insulin aspart (NOVOLOG) 100 UNIT/ML injection Inject 8 Units into the skin 3 (three) times daily with meals. 11/21/16   Ghimire, Henreitta Leber, MD  insulin glargine (LANTUS) 100 UNIT/ML injection Inject 0.24 mLs (24 Units total) into the skin daily. 11/22/16   Ghimire, Henreitta Leber, MD  lidocaine (LIDODERM) 5 % Place 1 patch onto the skin daily. Remove & Discard patch within 12 hours or as directed by MD 11/21/16   Ghimire, Henreitta Leber,  MD  magnesium oxide (MAG-OX) 400 (241.3 Mg) MG tablet Take 1 tablet (400 mg total) by mouth 2 (two) times daily. 11/21/16   Ghimire, Henreitta Leber, MD  metoprolol (LOPRESSOR) 100 MG tablet Take 100 mg by mouth 2 (two) times daily.    [provider]  Multiple Vitamins-Minerals (CERTAVITE SENIOR/ANTIOXIDANT) TABS Take 1 tablet by mouth daily.    [provider]  nystatin (NYSTATIN) powder Apply 1 g topically 2 (two) times daily.    [provider]  omeprazole (PRILOSEC) 40 MG capsule Take 40 mg by mouth daily.    [provider]  ondansetron (ZOFRAN ODT) 4 MG disintegrating tablet Take 1 tablet (4 mg total) by mouth every 8 (eight) hours as needed for nausea or vomiting. 10/14/16   Street, [place], PA-C  polyethylene glycol (MIRALAX / GLYCOLAX) packet Take 17 g by mouth daily. 11/22/16   Ghimire, Henreitta Leber, MD  ranitidine Keturah Shavers)  150 MG tablet Take 1 tablet (150 mg total) by mouth 2 (two) times daily. 10/14/16   Street, [place], PA-C  Saccharomyces boulardii (PROBIOTIC) 250 MG CAPS Take 1 capsule by mouth 2 (two) times daily.    [provider]  sodium bicarbonate 650 MG tablet Take 1 tablet (650 mg total) by mouth 2 (two) times daily. 06/30/16   Patrecia Pour, MD  Vitamin D, Ergocalciferol, (DRISDOL) 50000 units CAPS capsule Take 1 capsule (50,000 Units total) by mouth every 7 (seven) days. 11/21/16   Ghimire, Henreitta Leber, MD    Family History Family History  Problem Relation Age of Onset  . Stroke Mother   . Diabetes Mother   . Kidney failure Mother   . Heart failure Mother   . Stroke Father   . Cancer Sister        Breast- 9's    Social History Social History   Tobacco Use  . Smoking status: Current Every Day Smoker    Packs/day: 1.00    Years: 32.00    Pack years: 32.00    Types: Cigarettes    Last attempt to quit: 09/23/2012    Years since quitting: 5.4  . Smokeless tobacco: Never Used  . Tobacco comment: or less  Substance Use Topics  .  Alcohol use: No  . Drug use: No     Allergies   Hydrocodone   Review of Systems Review of Systems  Unable to perform ROS: Mental status change  Neurological: Positive for weakness.     Physical Exam Updated Vital Signs BP 134/70 (BP Location: Left Arm)   Pulse 71   Temp 97.7 F (36.5 C) (Oral)   Resp 17   LMP 03/06/2013   SpO2 97%   Physical Exam  Constitutional: She appears well-developed and well-nourished. She appears lethargic. No distress.  HENT:  Head: Normocephalic and atraumatic.  Eyes: Conjunctivae are normal.  She is a small pustule on her left lower eyelid with some erythema.  Neck: Neck supple.  Cardiovascular: Normal rate, regular rhythm and normal heart sounds.  No murmur heard. Pulmonary/Chest: Effort normal and breath sounds normal. No respiratory distress. She has no wheezes. She has no rales.  Abdominal: Soft. She exhibits no mass. There is no tenderness. There is no guarding.  Musculoskeletal: She exhibits no edema, tenderness or deformity.  Neurological: She appears lethargic. GCS eye subscore is 4. GCS verbal subscore is 5. GCS motor subscore is 6.  She is not oriented to time or place.  She is moving all extremities but is not participating in a strength exam. she is awake but slow to respond..  Skin: Skin is warm and dry.  Psychiatric: She has a normal mood and affect.  Nursing note and vitals reviewed.    ED Treatments / Results  Labs (all labs ordered are listed, but only abnormal results are displayed) Labs Reviewed  MRSA PCR SCREENING - Abnormal; Notable for the following components:      Result Value   MRSA by PCR POSITIVE (*)    All other components within normal limits  URINALYSIS, ROUTINE W REFLEX MICROSCOPIC - Abnormal; Notable for the following components:   Color, Urine STRAW (*)    Specific Gravity, Urine 1.004 (*)    Hgb urine dipstick SMALL (*)    Leukocytes, UA LARGE (*)    Bacteria, UA RARE (*)    All other components  within normal limits  COMPREHENSIVE METABOLIC PANEL - Abnormal; Notable for the following components:  Potassium 5.4 (*)    Glucose, Bld 107 (*)    BUN 53 (*)    Creatinine, Ser 4.28 (*)    Total Protein 8.4 (*)    AST 12 (*)    GFR calc non Af Amer 11 (*)    GFR calc Af Amer 12 (*)    All other components within normal limits  CBC WITH DIFFERENTIAL/PLATELET - Abnormal; Notable for the following components:   RBC 3.21 (*)    Hemoglobin 9.6 (*)    HCT 30.1 (*)    Platelets 413 (*)    All other components within normal limits  MAGNESIUM - Abnormal; Notable for the following components:   Magnesium 3.4 (*)    All other components within normal limits  CBC - Abnormal; Notable for the following components:   WBC 11.1 (*)    RBC 3.13 (*)    Hemoglobin 9.3 (*)    HCT 29.5 (*)    All other components within normal limits  BASIC METABOLIC PANEL - Abnormal; Notable for the following components:   Potassium 6.0 (*)    Glucose, Bld 169 (*)    BUN 49 (*)    Creatinine, Ser 4.17 (*)    Calcium 8.8 (*)    GFR calc non Af Amer 11 (*)    GFR calc Af Amer 13 (*)    All other components within normal limits  GLUCOSE, CAPILLARY - Abnormal; Notable for the following components:   Glucose-Capillary 63 (*)    All other components within normal limits  GLUCOSE, CAPILLARY - Abnormal; Notable for the following components:   Glucose-Capillary 153 (*)    All other components within normal limits  POTASSIUM - Abnormal; Notable for the following components:   Potassium 5.9 (*)    All other components within normal limits  POTASSIUM - Abnormal; Notable for the following components:   Potassium 6.2 (*)    All other components within normal limits  OSMOLALITY - Abnormal; Notable for the following components:   Osmolality 316 (*)    All other components within normal limits  GLUCOSE, CAPILLARY - Abnormal; Notable for the following components:   Glucose-Capillary 194 (*)    All other components  within normal limits  CULTURE, BLOOD (ROUTINE X 2)  CULTURE, BLOOD (ROUTINE X 2)  URINE CULTURE  LIPASE, BLOOD  AMMONIA  NA AND K (SODIUM & POTASSIUM), RAND UR  CREATININE, URINE, RANDOM  GLUCOSE, CAPILLARY  HIV ANTIBODY (ROUTINE TESTING W REFLEX)  POTASSIUM  POTASSIUM  POTASSIUM  I-STAT TROPONIN, ED  I-STAT CG4 LACTIC ACID, ED  CBG MONITORING, ED    EKG EKG Interpretation  Date/Time:  Wednesday March 10 2018 21:03:20 EDT Ventricular Rate:  73 PR Interval:    QRS Duration: 88 QT Interval:  435 QTC Calculation: 480 R Axis:   99 Text Interpretation:  Sinus rhythm Borderline right axis deviation no significant change from prior 5/19 Confirmed by Aletta Edouard [phone]) on 03/10/2018 9:07:48 PM Also confirmed by Aletta Edouard [phone]), editor Hattie Perch ([id number])  on 03/11/2018 8:36:51 AM   Radiology Dg Chest 1 View  Result Date: 03/10/2018 CLINICAL DATA:  Altered mental status, lethargy and fatigue. EXAM: CHEST  1 VIEW COMPARISON:  None. FINDINGS: The heart size and mediastinal contours are within normal limits. There is no evidence of pulmonary edema, consolidation, pneumothorax, nodule or pleural fluid. The visualized skeletal structures are unremarkable. IMPRESSION: No active disease. Electronically Signed   By: Aletta Edouard M.D.   On: 03/10/2018 21:32  Ct Head Wo Contrast  Result Date: 03/10/2018 CLINICAL DATA:  Altered mental status, lethargy and fatigue. EXAM: CT HEAD WITHOUT CONTRAST TECHNIQUE: Contiguous axial images were obtained from the base of the skull through the vertex without intravenous contrast. COMPARISON:  11/11/2017 FINDINGS: Brain: Stable chronic small vessel ischemic disease in the periventricular white matter and old lacunar infarcts in both basal ganglia. The brain demonstrates no evidence of hemorrhage, acute infarction, edema, mass effect, extra-axial fluid collection, hydrocephalus or mass lesion. Vascular: No hyperdense vessel or  unexpected calcification. Skull: Normal. Negative for fracture or focal lesion. Sinuses/Orbits: No acute finding. Other: None. IMPRESSION: Stable chronic small vessel disease and bilateral old lacunar infarcts in the basal ganglia. No acute findings by head CT. Electronically Signed   By: Aletta Edouard M.D.   On: 03/10/2018 21:54   US Renal  Result Date: 03/10/2018 CLINICAL DATA:  Chronic kidney disease, UTI EXAM: RENAL / URINARY TRACT ULTRASOUND COMPLETE COMPARISON:  05/01/2017 FINDINGS: Right Kidney: Length: 10 cm. Slightly echogenic cortex. No hydronephrosis or mass Left Kidney: Length: 10 cm. Slightly echogenic cortex. No hydronephrosis or mass Bladder: Appears normal for degree of bladder distention. IMPRESSION: 1. Echogenic cortex bilaterally consistent with medical renal disease. No hydronephrosis Electronically Signed   By: Donavan Foil M.D.   On: 03/10/2018 23:38    Procedures Procedures (including critical care time)  Medications Ordered in ED Medications  sodium chloride 0.9 % bolus 1,000 mL (has no administration in time range)     Initial Impression / Assessment and Plan / ED Course  I have reviewed the triage vital signs and the nursing notes.  Pertinent labs & imaging results that were available during my care of the patient were reviewed by me and considered in my medical decision making (see chart for details).  Clinical Course as of Mar 12 1203  Wed Mar 10, 6260  [id number] 57 year old female here for increased lethargy.  She is difficult historian, complaining of some pain in her left eyelid and some itchiness down in her GU area.  Her vitals are all unremarkable.  She is getting blood work chest x-ray urinalysis by cath and head CT.  Also EKG.   [MB]  2154 discussed with [facility] hospitalist who will admit the patient to their service.   [MB]    Clinical Course User Index [MB] Hayden Rasmussen, MD     Final Clinical Impressions(s) / ED Diagnoses   Final diagnoses:    Altered behavior  UTI (urinary tract infection)    ED Discharge Orders    None       Hayden Rasmussen, MD 03/11/18 1205

## 2018-03-10 NOTE — ED Notes (Signed)
Patient being transported to radiology.

## 2018-03-10 NOTE — ED Triage Notes (Signed)
PT BIB [facility] from [facility]. EMS called for AMS pt has been altered since she woke up this am. Pt c/o lethargy and fatigue. Itching in her groin but no other urinary symptoms.

## 2018-03-10 NOTE — H&P (Addendum)
History and Physical    ADRIANN THAU ZDG:[id number] DOB: Aug 18, 1960 DOA: 03/10/2018  PCP: Charlott Rakes, MD  Patient coming from: ALF  I have personally briefly reviewed patient's old medical records in [place]  Chief Complaint: AMS  HPI: MAHI ZABRISKIE is a 57 y.o. female with medical history significant of CKD stage 5, DM, HTN, stroke.  Patient presents to the ED with 1 day history of lethargy, fatigue, AMS.   ED Course: Initially patient wasn't oriented in ED and not participating in exam per EDP note.  UA suggestive of UTI so started on rocephin and given 1L IVF bolus.  Patient now seems to be significantly more alert and able to give more of a history:  Patient has a stye in her L eye, itchiness in groin, and yesterday had L leg pain.  Denies fever, chills, back pain, headache, trauma.  Review of Systems: As per HPI otherwise 10 point review of systems negative.   Past Medical History:  Diagnosis Date  . Diabetes mellitus   . Hyperlipidemia   . Hypertension   . Stroke [facility]) 04-01-11   left frontal subcortical, saw Dr. [name]   . TIA (transient ischemic attack) 03-12-11    Past Surgical History:  Procedure Laterality Date  . OPEN REDUCTION INTERNAL FIXATION (ORIF) DISTAL RADIAL FRACTURE Left 01/28/2016   Procedure: OPEN REDUCTION INTERNAL FIXATION (ORIF) DISTAL RADIAL FRACTURE;  Surgeon: Iran Planas, MD;  Location: [facility];  Service: Orthopedics;  Laterality: Left;  . [place]     age 29     reports that she has been smoking cigarettes. She has a 32.00 pack-year smoking history. She has never used smokeless tobacco. She reports that she does not drink alcohol or use drugs.  Allergies  Allergen Reactions  . Hydrocodone Nausea And Vomiting    Family History  Problem Relation Age of Onset  . Stroke Mother   . Diabetes Mother   . Kidney failure Mother   . Heart failure Mother   .  Stroke Father   . Cancer Sister        Breast- 67's     Prior to Admission medications   Medication Sig Start Date End Date Taking? Authorizing Provider  amLODipine (NORVASC) 10 MG tablet Take 1 tablet (10 mg total) by mouth daily. 11/22/16  Yes Ghimire, Henreitta Leber, MD  aspirin 81 MG tablet Take 1 tablet (81 mg total) by mouth daily. 08/24/12  Yes Reyne Dumas, MD  atorvastatin (LIPITOR) 40 MG tablet Take 40 mg by mouth daily.   Yes [provider]  calcium carbonate (TUMS - DOSED IN MG ELEMENTAL CALCIUM) 500 MG chewable tablet Chew 4 tablets (800 mg of elemental calcium total) by mouth 3 (three) times daily. Patient taking differently: Chew 800 mg of elemental calcium by mouth 3 (three) times daily as needed for indigestion or heartburn.  11/21/16  Yes Ghimire, Henreitta Leber, MD  diphenhydrAMINE (BENADRYL) 25 MG tablet Take 25 mg by mouth every 4 (four) hours as needed for itching.   Yes [provider]  furosemide (LASIX) 20 MG tablet Take 20 mg by mouth daily.   Yes [provider]  gabapentin (NEURONTIN) 100 MG capsule Take 100 mg by mouth 2 (two) times daily.   Yes [provider]  hydrALAZINE (APRESOLINE) 25 MG tablet Take 1 tablet (25 mg total) by mouth 3 (three) times daily. 11/21/16  Yes Ghimire, Dispensing optician  M, MD  insulin aspart (NOVOLOG) 100 UNIT/ML injection Inject 8 Units into the skin 3 (three) times daily with meals. 11/21/16  Yes Ghimire, Henreitta Leber, MD  insulin glargine (LANTUS) 100 UNIT/ML injection Inject 0.24 mLs (24 Units total) into the skin daily. Patient taking differently: Inject 40 Units into the skin daily.  11/22/16  Yes Ghimire, Henreitta Leber, MD  lidocaine (LIDODERM) 5 % Place 1 patch onto the skin daily. Remove & Discard patch within 12 hours or as directed by MD 11/21/16  Yes Ghimire, Henreitta Leber, MD  metoprolol (LOPRESSOR) 100 MG tablet Take 100 mg by mouth 2 (two) times daily.   Yes [provider]  Multiple Vitamins-Minerals (CERTAVITE  SENIOR/ANTIOXIDANT) TABS Take 1 tablet by mouth daily.   Yes [provider]  omeprazole (PRILOSEC) 40 MG capsule Take 40 mg by mouth daily.   Yes [provider]  polyethylene glycol (MIRALAX / GLYCOLAX) packet Take 17 g by mouth daily. 11/22/16  Yes Ghimire, Henreitta Leber, MD  Probiotic Product (PROBIOTIC PO) Take 1 tablet by mouth daily.   Yes [provider]  ranitidine (ZANTAC) 150 MG tablet Take 1 tablet (150 mg total) by mouth 2 (two) times daily. 10/14/16  Yes Street, Plate[place], PA-C  sodium bicarbonate 650 MG tablet Take 1 tablet (650 mg total) by mouth 2 (two) times daily. 06/30/16  Yes Patrecia Pour, MD  Vitamin D, Ergocalciferol, (DRISDOL) 50000 units CAPS capsule Take 1 capsule (50,000 Units total) by mouth every 7 (seven) days. 11/21/16  Yes Jonetta Osgood, MD    Physical Exam: Vitals:   03/10/18 1925 03/10/18 1936 03/10/18 2000  BP:  134/70 106/89  Pulse:  71 70  Resp:  17 17  Temp:  97.7 F (36.5 C)   TempSrc:  Oral   SpO2: 95% 97% 100%    Constitutional: NAD, calm, comfortable Eyes: PERRL, lids and conjunctivae normal ENMT: Mucous membranes are moist. Posterior pharynx clear of any exudate or lesions.Normal dentition.  Neck: normal, supple, no masses, no thyromegaly Respiratory: clear to auscultation bilaterally, no wheezing, no crackles. Normal respiratory effort. No accessory muscle use.  Cardiovascular: Regular rate and rhythm, no murmurs / rubs / gallops. No extremity edema. 2+ pedal pulses. No carotid bruits.  Abdomen: no tenderness, no masses palpated. No hepatosplenomegaly. Bowel sounds positive.  Musculoskeletal: no clubbing / cyanosis. No joint deformity upper and lower extremities. Good ROM, no contractures. Normal muscle tone.  Skin: no rashes, lesions, ulcers. No induration Neurologic: Strength seems equal and bilateral to me, but patient c/o left sided symptoms.  No facial droop nor slurred speech. Psychiatric: Slight lethargy, patient  complains of being "tired"   Labs on Admission: I have personally reviewed following labs and imaging studies  CBC: Recent Labs  Lab 03/10/18 2027  WBC 9.0  NEUTROABS 5.8  HGB 9.6*  HCT 30.1*  MCV 93.8  PLT 681*   Basic Metabolic Panel: Recent Labs  Lab 03/10/18 2027 03/10/18 2157  NA 140  --   K 5.4*  --   CL 102  --   CO2 26  --   GLUCOSE 107*  --   BUN 53*  --   CREATININE 4.28*  --   CALCIUM 9.3  --   MG  --  3.4*   GFR: CrCl cannot be calculated (Unknown ideal weight.). Liver Function Tests: Recent Labs  Lab 03/10/18 2027  AST 12*  ALT 12  ALKPHOS 87  BILITOT 0.4  PROT 8.4*  ALBUMIN 4.0  Recent Labs  Lab 03/10/18 2027  LIPASE 32   Recent Labs  Lab 03/10/18 2027  AMMONIA 27   Coagulation Profile: No results for input(s): INR, PROTIME in the last 168 hours. Cardiac Enzymes: No results for input(s): CKTOTAL, CKMB, CKMBINDEX, TROPONINI in the last 168 hours. BNP (last 3 results) No results for input(s): PROBNP in the last 8760 hours. HbA1C: No results for input(s): HGBA1C in the last 72 hours. CBG: No results for input(s): GLUCAP in the last 168 hours. Lipid Profile: No results for input(s): CHOL, HDL, LDLCALC, TRIG, CHOLHDL, LDLDIRECT in the last 72 hours. Thyroid Function Tests: No results for input(s): TSH, T4TOTAL, FREET4, T3FREE, THYROIDAB in the last 72 hours. Anemia Panel: No results for input(s): VITAMINB12, FOLATE, FERRITIN, TIBC, IRON, RETICCTPCT in the last 72 hours. Urine analysis:    Component Value Date/Time   COLORURINE STRAW (A) 03/10/2018 2027   APPEARANCEUR CLEAR 03/10/2018 2027   LABSPEC 1.004 (L) 03/10/2018 2027   PHURINE 8.0 03/10/2018 2027   GLUCOSEU NEGATIVE 03/10/2018 2027   HGBUR SMALL (A) 03/10/2018 2027   BILIRUBINUR NEGATIVE 03/10/2018 2027   BILIRUBINUR n 05/25/2012 1202   KETONESUR NEGATIVE 03/10/2018 2027   PROTEINUR NEGATIVE 03/10/2018 2027   UROBILINOGEN 0.2 05/25/2012 1202   UROBILINOGEN 0.2  03/29/2011 0131   NITRITE NEGATIVE 03/10/2018 2027   LEUKOCYTESUR LARGE (A) 03/10/2018 2027    Radiological Exams on Admission: Dg Chest 1 View  Result Date: 03/10/2018 CLINICAL DATA:  Altered mental status, lethargy and fatigue. EXAM: CHEST  1 VIEW COMPARISON:  None. FINDINGS: The heart size and mediastinal contours are within normal limits. There is no evidence of pulmonary edema, consolidation, pneumothorax, nodule or pleural fluid. The visualized skeletal structures are unremarkable. IMPRESSION: No active disease. Electronically Signed   By: Aletta Edouard M.D.   On: 03/10/2018 21:32   Ct Head Wo Contrast  Result Date: 03/10/2018 CLINICAL DATA:  Altered mental status, lethargy and fatigue. EXAM: CT HEAD WITHOUT CONTRAST TECHNIQUE: Contiguous axial images were obtained from the base of the skull through the vertex without intravenous contrast. COMPARISON:  11/11/2017 FINDINGS: Brain: Stable chronic small vessel ischemic disease in the periventricular white matter and old lacunar infarcts in both basal ganglia. The brain demonstrates no evidence of hemorrhage, acute infarction, edema, mass effect, extra-axial fluid collection, hydrocephalus or mass lesion. Vascular: No hyperdense vessel or unexpected calcification. Skull: Normal. Negative for fracture or focal lesion. Sinuses/Orbits: No acute finding. Other: None. IMPRESSION: Stable chronic small vessel disease and bilateral old lacunar infarcts in the basal ganglia. No acute findings by head CT. Electronically Signed   By: Aletta Edouard M.D.   On: 03/10/2018 21:54    EKG: Independently reviewed.  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 UTI -  vs chronic bacteruria 1. Rocephin 2. Culture pending 3. Will "gently hydrate" by holding lasix for now 2. Delirium - secondary to UTI and hypomag 1. Treatment of UTI as above 2. Mental status seems to be somewhat improved from EDP exam as she is now obeying commands and seems more oriented. 3. C/o left leg "pain" yesterday, but doesn't seem to have any focal neurologic findings on exam.  Also marked mental status improvement in ED  so holding off on ordering MRI or anything right now. 3. Hypermagnesemia - 1. discontinuing her PO mag-ox in setting of CKD stage 5 4. HTN - continue BP meds 5. CKD stage 5 - 1. Chronic and stable 2. May want to make sure she is follow up with nephrology routinely as outpt. 3. Cont bicarb 4. Holding lasix for tonight 5. Repeat BMP in AM 6. Will also get renal US to r/o stone, though denies flank pain. 6. DM2 - 1. Half home lantus to 20u daily 2. Sensitive SSI AC  DVT prophylaxis: Heparin [place] Code Status: Full Family Communication: No family in room Disposition Plan: ALF after admit Consults called: None Admission status: Place in obs   Granger Chui, [facility] Pager [phone] Only works nights!  If 7AM-7PM, please contact the primary day team physician taking care of patient  [email or web address] Password [facility]  03/10/2018, 10:28 PM

## 2018-03-11 ENCOUNTER — Encounter (HOSPITAL_COMMUNITY): Payer: Self-pay | Admitting: *Deleted

## 2018-03-11 ENCOUNTER — Other Ambulatory Visit: Payer: Self-pay

## 2018-03-11 DIAGNOSIS — G9340 Encephalopathy, unspecified: Secondary | ICD-10-CM

## 2018-03-11 DIAGNOSIS — E875 Hyperkalemia: Secondary | ICD-10-CM

## 2018-03-11 LAB — BASIC METABOLIC PANEL
Anion gap: 12 (ref 5–15)
BUN: 49 mg/dL — AB (ref 6–20)
CHLORIDE: 106 mmol/L (ref 98–111)
CO2: 24 mmol/L (ref 22–32)
CREATININE: 4.17 mg/dL — AB (ref 0.44–1.00)
Calcium: 8.8 mg/dL — ABNORMAL LOW (ref 8.9–10.3)
GFR calc Af Amer: 13 mL/min — ABNORMAL LOW (ref >60)
GFR calc non Af Amer: 11 mL/min — ABNORMAL LOW (ref >60)
GLUCOSE: 169 mg/dL — AB (ref 70–99)
POTASSIUM: 6 mmol/L — AB (ref 3.5–5.1)
SODIUM: 142 mmol/L (ref 135–145)

## 2018-03-11 LAB — CBC
HEMATOCRIT: 29.5 % — AB (ref 36.0–46.0)
HEMOGLOBIN: 9.3 g/dL — AB (ref 12.0–15.0)
MCH: 29.7 pg (ref 26.0–34.0)
MCHC: 31.5 g/dL (ref 30.0–36.0)
MCV: 94.2 fL (ref 78.0–100.0)
Platelets: 369 10*3/uL (ref 150–400)
RBC: 3.13 MIL/uL — AB (ref 3.87–5.11)
RDW: 13.3 % (ref 11.5–15.5)
WBC: 11.1 10*3/uL — ABNORMAL HIGH (ref 4.0–10.5)

## 2018-03-11 LAB — POTASSIUM
POTASSIUM: 6.2 mmol/L — AB (ref 3.5–5.1)
POTASSIUM: 6.1 mmol/L — AB (ref 3.5–5.1)
Potassium: 5.9 mmol/L — ABNORMAL HIGH (ref 3.5–5.1)
Potassium: 6.2 mmol/L — ABNORMAL HIGH (ref 3.5–5.1)
Potassium: 5.5 mmol/L — ABNORMAL HIGH (ref 3.5–5.1)

## 2018-03-11 LAB — GLUCOSE, CAPILLARY
GLUCOSE-CAPILLARY: 153 mg/dL — AB (ref 70–99)
GLUCOSE-CAPILLARY: 94 mg/dL (ref 70–99)
GLUCOSE-CAPILLARY: 194 mg/dL — AB (ref 70–99)
GLUCOSE-CAPILLARY: 91 mg/dL (ref 70–99)
GLUCOSE-CAPILLARY: 132 mg/dL — AB (ref 70–99)

## 2018-03-11 LAB — MRSA PCR SCREENING: MRSA BY PCR: POSITIVE — AB

## 2018-03-11 LAB — OSMOLALITY: Osmolality: 316 mOsm/kg — ABNORMAL HIGH (ref 275–295)

## 2018-03-11 LAB — CREATININE, URINE, RANDOM: Creatinine, Urine: 20.31 mg/dL

## 2018-03-11 LAB — NA AND K (SODIUM & POTASSIUM), RAND UR
Potassium Urine: 19 mmol/L
Sodium, Ur: 86 mmol/L

## 2018-03-11 LAB — HIV ANTIBODY (ROUTINE TESTING W REFLEX): HIV Screen 4th Generation wRfx: NONREACTIVE

## 2018-03-11 MED ORDER — PNEUMOCOCCAL VAC POLYVALENT 25 MCG/0.5ML IJ INJ
0.5000 mL | INJECTION | INTRAMUSCULAR | Status: AC
  Administered 2018-03-12: 0.5 mL via INTRAMUSCULAR
  Filled 2018-03-11: qty 0.5

## 2018-03-11 MED ORDER — SODIUM CHLORIDE 0.9 % IV BOLUS
500.0000 mL | Freq: Once | INTRAVENOUS | Status: AC
  Administered 2018-03-11: 500 mL via INTRAVENOUS

## 2018-03-11 MED ORDER — SODIUM POLYSTYRENE SULFONATE 15 GM/60ML PO SUSP
30.0000 g | Freq: Once | ORAL | Status: AC
  Administered 2018-03-11: 30 g via ORAL
  Filled 2018-03-11: qty 120

## 2018-03-11 MED ORDER — FUROSEMIDE 20 MG PO TABS: 20.0000 mg | ORAL_TABLET | Freq: Every day | ORAL | Status: DC

## 2018-03-11 MED ORDER — FUROSEMIDE 10 MG/ML IJ SOLN
20.0000 mg | Freq: Once | INTRAMUSCULAR | Status: AC
  Administered 2018-03-11: 20 mg via INTRAVENOUS
  Filled 2018-03-11: qty 2

## 2018-03-11 MED ORDER — FUROSEMIDE 20 MG PO TABS
20.0000 mg | ORAL_TABLET | Freq: Every day | ORAL | Status: DC
  Administered 2018-03-12 – 2018-03-13 (×2): 20 mg via ORAL
  Filled 2018-03-11 (×2): qty 1

## 2018-03-11 MED ORDER — INFLUENZA VAC SPLIT QUAD 0.5 ML IM SUSY
0.5000 mL | PREFILLED_SYRINGE | INTRAMUSCULAR | Status: AC
  Administered 2018-03-12: 0.5 mL via INTRAMUSCULAR
  Filled 2018-03-11: qty 0.5

## 2018-03-11 MED ORDER — MUPIROCIN 2 % EX OINT
1.0000 "application " | TOPICAL_OINTMENT | Freq: Two times a day (BID) | CUTANEOUS | Status: DC
  Administered 2018-03-11 – 2018-03-13 (×5): 1 via NASAL
  Filled 2018-03-11 (×2): qty 22

## 2018-03-11 NOTE — Progress Notes (Signed)
PROGRESS NOTE [facility]   ARABELL NERIA   DPO:[id number] DOB: 1961/05/17  DOA: 03/10/2018 PCP: Charlott Rakes, MD   Brief Narrative:  [name] Michael is a 57 year old female with medical history CKD stage V not on dialysis, DM type II, hypertension and prior stroke.  Patient was sent to the ED from ALF due to lethargy, fatigue and altered mental status.  Upon ED evaluation patient was found to be confused, UA suggestive of UTI.  Lab work-up revealed increasing creatinine from baseline, elevated potassium and patient was admitted with working diagnosis of UTI complicated by hyperkalemia.   Subjective: Patient seen and examined she feels much better than yesterday.  Her mental status has improved findings almost back to baseline.  She is oriented to place and self only.  Report mild lower abdominal pain.  Denies nausea vomiting.  No shortness of breath.  Remains afebrile.  Assessment & Plan: Acute metabolic encephalopathy In setting of suspected UTI complicated by AKI. Mental status improved, oriented to self and place only.  Following commands well.  Treat underlying causes. Will check TSH, B12 and vitamin D.   Suspected UTI UA grossly abnormal, patient started on empiric Rocephin.  Urine cultures pending.   CKD stage V Creatinine seems to be at baseline, case discussed with nephrology advise follow-up as an outpatient. Patient has developed hyperkalemia.  Will resume Lasix.  Patient with good urine output  Hyperkalemia/hypermagnesemia Patient was given IV fluids, Lasix and Kayexalate. Will check potassium and continue to give Kayexalate as needed.  Discussed with neurology no need for dialysis at this point.  Check potassium every 4 hours.  Hypertension BP stable Continue amlodipine and hydralazine as needed  Diabetes mellitus type 2 CBGs stable Continue SSI is on Lantus as ordered. Check A1c  DVT prophylaxis: Heparin [place] Code Status: Full code Family Communication:  None at bedside Disposition Plan: Home when hyperkalemia improved and urine cultures are resulted.  Consultants:   None  Procedures:   None  Antimicrobials: Anti-infectives (From admission, onward)   Start     Dose/Rate Route Frequency Ordered Stop   03/11/18 2200  cefTRIAXone (ROCEPHIN) 1 g in sodium chloride 0.9 % 100 mL IVPB     1 g 200 mL/hr over 30 Minutes Intravenous Every 24 hours 03/10/18 2147     03/10/18 2145  cefTRIAXone (ROCEPHIN) 1 g in sodium chloride 0.9 % 100 mL IVPB     1 g 200 mL/hr over 30 Minutes Intravenous  Once 03/10/18 2138 03/10/18 2327       Objective: Vitals:   03/10/18 2000 03/10/18 2318 03/11/18 0431 03/11/18 0939  BP: 106/89 (!) 141/95 129/63 125/62  Pulse: 70 77 71 74  Resp: 17 12 16    Temp:  97.9 F (36.6 C) 98 F (36.7 C)   TempSrc:  Oral Oral   SpO2: 100% 95% 97%   Weight:  48.1 kg    Height:  4\' 6"  (1.372 m)      Intake/Output Summary (Last 24 hours) at 03/11/2018 1259 Last data filed at 03/11/2018 0838 Gross per 24 hour  Intake 202.4 ml  Output -  Net 202.4 ml   Filed Weights   03/10/18 2318  Weight: 48.1 kg    Examination:  General exam: Appears calm and comfortable  HEENT: AC/AT, stye on left eye OP moist and clear Respiratory system: Clear to auscultation. No wheezes,crackle or rhonchi Cardiovascular system: S1 & S2 heard, RRR. No JVD, murmurs, rubs or gallops Gastrointestinal system: Abdomen is nondistended,  soft, mild lower abdominal tenderness.  Central nervous system: Oriented to person and place only Extremities: No pedal edema. Skin: No rashes, lesions or ulcers Psychiatry:  Mood & affect appropriate.    Data Reviewed: I have personally reviewed following labs and imaging studies  CBC: Recent Labs  Lab 03/10/18 2027 03/11/18 0504  WBC 9.0 11.1*  NEUTROABS 5.8  --   HGB 9.6* 9.3*  HCT 30.1* 29.5*  MCV 93.8 94.2  PLT 413* 419   Basic Metabolic Panel: Recent Labs  Lab 03/10/18 2027 03/10/18 2157  03/11/18 0504 03/11/18 0716 03/11/18 1105  NA 140  --  142  --   --   K 5.4*  --  6.0* 5.9* 6.2*  CL 102  --  106  --   --   CO2 26  --  24  --   --   GLUCOSE 107*  --  169*  --   --   BUN 53*  --  49*  --   --   CREATININE 4.28*  --  4.17*  --   --   CALCIUM 9.3  --  8.8*  --   --   MG  --  3.4*  --   --   --    GFR: Estimated Creatinine Clearance: 9 mL/min (A) (by C-G formula based on SCr of 4.17 mg/dL (H)). Liver Function Tests: Recent Labs  Lab 03/10/18 2027  AST 12*  ALT 12  ALKPHOS 87  BILITOT 0.4  PROT 8.4*  ALBUMIN 4.0   Recent Labs  Lab 03/10/18 2027  LIPASE 32   Recent Labs  Lab 03/10/18 2027  AMMONIA 27   Coagulation Profile: No results for input(s): INR, PROTIME in the last 168 hours. Cardiac Enzymes: No results for input(s): CKTOTAL, CKMB, CKMBINDEX, TROPONINI in the last 168 hours. BNP (last 3 results) No results for input(s): PROBNP in the last 8760 hours. HbA1C: No results for input(s): HGBA1C in the last 72 hours. CBG: Recent Labs  Lab 03/10/18 2329 03/11/18 0432 03/11/18 0730 03/11/18 1132  GLUCAP 63* 153* 94 194*   Lipid Profile: No results for input(s): CHOL, HDL, LDLCALC, TRIG, CHOLHDL, LDLDIRECT in the last 72 hours. Thyroid Function Tests: No results for input(s): TSH, T4TOTAL, FREET4, T3FREE, THYROIDAB in the last 72 hours. Anemia Panel: No results for input(s): VITAMINB12, FOLATE, FERRITIN, TIBC, IRON, RETICCTPCT in the last 72 hours. Sepsis Labs: Recent Labs  Lab 03/10/18 2034  LATICACIDVEN 0.99    Recent Results (from the past 240 hour(s))  Culture, blood (routine x 2)     Status: None (Preliminary result)   Collection Time: 03/10/18  8:27 PM  Result Value Ref Range Status   Specimen Description   Final    BLOOD LEFT ANTECUBITAL Performed at [facility] [address], [place], [place] [address]    Special Requests   Final    BOTTLES DRAWN AEROBIC AND ANAEROBIC Blood Culture results may not be  optimal due to an excessive volume of blood received in culture bottles Performed at [facility] [address], [place], [place] [address]    Culture   Final    NO GROWTH < 12 HOURS Performed at [facility] [address] [place], [place] [address]    Report Status PENDING  Incomplete  MRSA PCR Screening     Status: Abnormal   Collection Time: 03/10/18 11:29 PM  Result Value Ref Range Status   MRSA by PCR POSITIVE (A)  NEGATIVE Final    Comment:        The GeneXpert MRSA Assay (FDA approved for NASAL specimens only), is one component of a comprehensive MRSA colonization surveillance program. It is not intended to diagnose MRSA infection nor to guide or monitor treatment for MRSA infections. RESULT CALLED TO, READ BACK BY AND VERIFIED WITHJanee Morn RN AT 1761 03/11/18 BY TIBBITTS,K Performed at [facility], [place] [address], [place], [place] [address]       Radiology Studies: Dg Chest 1 View  Result Date: 03/10/2018 CLINICAL DATA:  Altered mental status, lethargy and fatigue. EXAM: CHEST  1 VIEW COMPARISON:  None. FINDINGS: The heart size and mediastinal contours are within normal limits. There is no evidence of pulmonary edema, consolidation, pneumothorax, nodule or pleural fluid. The visualized skeletal structures are unremarkable. IMPRESSION: No active disease. Electronically Signed   By: Aletta Edouard M.D.   On: 03/10/2018 21:32   Ct Head Wo Contrast  Result Date: 03/10/2018 CLINICAL DATA:  Altered mental status, lethargy and fatigue. EXAM: CT HEAD WITHOUT CONTRAST TECHNIQUE: Contiguous axial images were obtained from the base of the skull through the vertex without intravenous contrast. COMPARISON:  11/11/2017 FINDINGS: Brain: Stable chronic small vessel ischemic disease in the periventricular white matter and old lacunar infarcts in both basal ganglia. The brain demonstrates no evidence of hemorrhage, acute infarction, edema,  mass effect, extra-axial fluid collection, hydrocephalus or mass lesion. Vascular: No hyperdense vessel or unexpected calcification. Skull: Normal. Negative for fracture or focal lesion. Sinuses/Orbits: No acute finding. Other: None. IMPRESSION: Stable chronic small vessel disease and bilateral old lacunar infarcts in the basal ganglia. No acute findings by head CT. Electronically Signed   By: Aletta Edouard M.D.   On: 03/10/2018 21:54   US Renal  Result Date: 03/10/2018 CLINICAL DATA:  Chronic kidney disease, UTI EXAM: RENAL / URINARY TRACT ULTRASOUND COMPLETE COMPARISON:  05/01/2017 FINDINGS: Right Kidney: Length: 10 cm. Slightly echogenic cortex. No hydronephrosis or mass Left Kidney: Length: 10 cm. Slightly echogenic cortex. No hydronephrosis or mass Bladder: Appears normal for degree of bladder distention. IMPRESSION: 1. Echogenic cortex bilaterally consistent with medical renal disease. No hydronephrosis Electronically Signed   By: Donavan Foil M.D.   On: 03/10/2018 23:38    Scheduled Meds: . amLODipine  10 mg Oral Daily  . aspirin EC  81 mg Oral Daily  . atorvastatin  40 mg Oral q1800  . famotidine  20 mg Oral BID  . gabapentin  100 mg Oral BID  . heparin  5,000 Units Subcutaneous Q8H  . hydrALAZINE  25 mg Oral TID  . [START ON 03/12/2018] Influenza vac split quadrivalent PF  0.5 mL Intramuscular Tomorrow-1000  . insulin aspart  0-9 Units Subcutaneous Q4H  . insulin glargine  20 Units Subcutaneous Daily  . lidocaine  1 patch Transdermal Q24H  . metoprolol tartrate  100 mg Oral BID  . multivitamin with minerals  1 tablet Oral Daily  . mupirocin ointment  1 application Nasal BID  . pantoprazole  80 mg Oral Daily  . [START ON 03/12/2018] pneumococcal 23 valent vaccine  0.5 mL Intramuscular Tomorrow-1000  . polyethylene glycol  17 g Oral Daily  . sodium bicarbonate  650 mg Oral BID   Continuous Infusions: . sodium chloride    . cefTRIAXone (ROCEPHIN)  IV       LOS: 0 days     Time spent: Total of 25 minutes spent with pt, greater than 50% of which was spent  in discussion of  treatment, counseling and coordination of care   Chipper Oman, MD Pager: Text Page via [email or web address]   If 7PM-7AM, please contact night-coverage [email or web address] 03/11/2018, 12:59 PM   Note - This record has been created using [employer]. Chart creation errors have been sought, but may not always have been located. Such creation errors do not reflect on the standard of medical care.

## 2018-03-11 NOTE — Progress Notes (Addendum)
K is 6.0 this morning up from 5.4 last evening: 1) hyperkalemic protocol 2) 500cc bolus 3) obtain EKG 4) obtain repeat potassium to verify that this is correct (and not just hemolysis) before we go to something like kayexelate 5) needs nephrology consult this AM   Addendum - no EKG changes, patient states she now remembers that she did see a "kidney doctor" in the past after leaving the hospital one time, but stopped going to him "because nothing was changing with her kidneys".

## 2018-03-11 NOTE — Consult Note (Signed)
Renal Service Consult Note [facility]  [name] Michael 03/11/2018 [place]azing Requesting Physician:  Dr Patrecia Pour  Reason for Consult:  AMS HPI: The patient is a 57 y.o. year-old presented w/  AMS, lethargy, fatigue, and L eye stye. Pt diagnosed with lower UTI. PMH includes poorly controlled DM II, HLD, HTN, CVA of L frontal region in 2011 and another in 2018, TIAs, AKI, CAP, diabetic retinopathy, depression, HLD, CKD stage 5, pt reporting blindness. Past surgical history includes ORIF L distal radius fracture 2017.  We are asked to see for CKD V.   Patient's baseline creat is around 4.2.  Followed by Dr Florene Glen at [facility], last visit late 2018, records pending.    Patient states was fatigued and nauseous for last few days, but is better today.  Getting abx for acute UTI now.  She lives in an ALF. Denies any voiding issues or change in urine color or output.    ROS  denies CP  no joint pain   no HA  no blurry vision  no rash  no diarrhea  no nausea/ vomiting  no dysuria  no difficulty voiding  no change in urine color    Past Medical History  Past Medical History:  Diagnosis Date  . Diabetes mellitus   . Hyperlipidemia   . Hypertension   . Stroke [facility]) 04-01-11   left frontal subcortical, saw Dr. [name]   . TIA (transient ischemic attack) 03-12-11   Past Surgical History  Past Surgical History:  Procedure Laterality Date  . OPEN REDUCTION INTERNAL FIXATION (ORIF) DISTAL RADIAL FRACTURE Left 01/28/2016   Procedure: OPEN REDUCTION INTERNAL FIXATION (ORIF) DISTAL RADIAL FRACTURE;  Surgeon: Iran Planas, MD;  Location: [place];  Service: Orthopedics;  Laterality: Left;  . [place] ADENOIDECTOMY     age 5   Family History  Family History  Problem Relation Age of Onset  . Stroke Mother   . Diabetes Mother   . Kidney failure Mother   . Heart failure Mother   . Stroke Father   . Cancer Sister    Breast- 45's   Social History  reports that she has been smoking cigarettes. She has a 32.00 pack-year smoking history. She has never used smokeless tobacco. She reports that she does not drink alcohol or use drugs. Allergies  Allergies  Allergen Reactions  . Hydrocodone Nausea And Vomiting   Home medications Prior to Admission medications   Medication Sig Start Date End Date Taking? Authorizing Provider  amLODipine (NORVASC) 10 MG tablet Take 1 tablet (10 mg total) by mouth daily. 11/22/16  Yes Ghimire, Henreitta Leber, MD  aspirin 81 MG tablet Take 1 tablet (81 mg total) by mouth daily. 08/24/12  Yes Reyne Dumas, MD  atorvastatin (LIPITOR) 40 MG tablet Take 40 mg by mouth daily.   Yes [provider]  calcium carbonate (TUMS - DOSED IN MG ELEMENTAL CALCIUM) 500 MG chewable tablet Chew 4 tablets (800 mg of elemental calcium total) by mouth 3 (three) times daily. Patient taking differently: Chew 800 mg of elemental calcium by mouth 3 (three) times daily as needed for indigestion or heartburn.  11/21/16  Yes Ghimire, Henreitta Leber, MD  diphenhydrAMINE (BENADRYL) 25 MG tablet Take 25 mg by mouth every 4 (four) hours as needed for itching.   Yes [provider]  furosemide (LASIX) 20 MG tablet Take 20 mg by mouth daily.   Yes [provider]  gabapentin (NEURONTIN) 100 MG capsule Take 100 mg by mouth 2 (two) times daily.   Yes [provider]  hydrALAZINE (APRESOLINE) 25 MG tablet Take 1 tablet (25 mg total) by mouth 3 (three) times daily. 11/21/16  Yes Ghimire, Henreitta Leber, MD  insulin aspart (NOVOLOG) 100 UNIT/ML injection Inject 8 Units into the skin 3 (three) times daily with meals. 11/21/16  Yes Ghimire, Henreitta Leber, MD  insulin glargine (LANTUS) 100 UNIT/ML injection Inject 0.24 mLs (24 Units total) into the skin daily. Patient taking differently: Inject 40 Units into the skin daily.  11/22/16  Yes Ghimire, Henreitta Leber, MD  lidocaine (LIDODERM) 5 % Place 1 patch onto the skin  daily. Remove & Discard patch within 12 hours or as directed by MD 11/21/16  Yes Ghimire, Henreitta Leber, MD  metoprolol (LOPRESSOR) 100 MG tablet Take 100 mg by mouth 2 (two) times daily.   Yes [provider]  Multiple Vitamins-Minerals (CERTAVITE SENIOR/ANTIOXIDANT) TABS Take 1 tablet by mouth daily.   Yes [provider]  omeprazole (PRILOSEC) 40 MG capsule Take 40 mg by mouth daily.   Yes [provider]  polyethylene glycol (MIRALAX / GLYCOLAX) packet Take 17 g by mouth daily. 11/22/16  Yes Ghimire, Henreitta Leber, MD  Probiotic Product (PROBIOTIC PO) Take 1 tablet by mouth daily.   Yes [provider]  ranitidine (ZANTAC) 150 MG tablet Take 1 tablet (150 mg total) by mouth 2 (two) times daily. 10/14/16  Yes Street, [place], PA-C  sodium bicarbonate 650 MG tablet Take 1 tablet (650 mg total) by mouth 2 (two) times daily. 06/30/16  Yes Patrecia Pour, MD  Vitamin D, Ergocalciferol, (DRISDOL) 50000 units CAPS capsule Take 1 capsule (50,000 Units total) by mouth every 7 (seven) days. 11/21/16  Yes Jonetta Osgood, MD   Liver Function Tests Recent Labs  Lab 03/10/18 2027  AST 12*  ALT 12  ALKPHOS 87  BILITOT 0.4  PROT 8.4*  ALBUMIN 4.0   Recent Labs  Lab 03/10/18 2027  LIPASE 32   CBC Recent Labs  Lab 03/10/18 2027 03/11/18 0504  WBC 9.0 11.1*  NEUTROABS 5.8  --   HGB 9.6* 9.3*  HCT 30.1* 29.5*  MCV 93.8 94.2  PLT 413* 102   Basic Metabolic Panel Recent Labs  Lab 03/10/18 2027 03/11/18 0504 03/11/18 0716 03/11/18 1105  NA 140 142  --   --   K 5.4* 6.0* 5.9* 6.2*  CL 102 106  --   --   CO2 26 24  --   --   GLUCOSE 107* 169*  --   --   BUN 53* 49*  --   --   CREATININE 4.28* 4.17*  --   --   CALCIUM 9.3 8.8*  --   --    Iron/TIBC/Ferritin/ %Sat    Component Value Date/Time   IRON 116 11/18/2016 0833   TIBC 182 (L) 11/18/2016 0833   FERRITIN 2,875 (H) 11/18/2016 0833   IRONPCTSAT 64 (H) 11/18/2016 5852    Vitals:   03/10/18 2318  03/11/18 0431 03/11/18 0939 03/11/18 1415  BP: (!) 141/95 129/63 125/62 139/70  Pulse: 77 71 74 70  Resp: 12 16    Temp: 97.9 F (36.6 C) 98 F (36.7 C)  98.4 F (36.9 C)  TempSrc: Oral Oral  Oral  SpO2: 95% 97%  99%  Weight: 48.1 kg     Height: 4' 6" (1.372 m)      Exam Gen  small framed female, musc wasting in extremities, is alert and responsive frail No rash, cyanosis or gangrene Sclera anicteric, throat clear  +JVD Chest clear R, rales L base RRR no MRG Abd firm, protuberant, nontender, +bs, no hsm GU defer MS no joint effusions or deformity Ext no LE or UE edema, no wounds or ulcers, musc wasting legs and arms Neuro is alert, Ox 3 , nf   date   Creat  egfr  2012- 14 0.4- 0.5  2017  2.0- 2.9  early 10/2016 2.5- 3.2  10/2016  9.5 >> 4.3  aki episode  11/2016 4.08  11  02/2017 4.39  10  10/2017 4.53  03/10/18 4.28  11  03/11/18 4.17  11    Home meds:  - amlodipine 10/ furosemide 20/ hydralazine 25 tid/ metoprolol 100 bid  - insulin glargine 40 qd/ insulin aspart 8 tid  - atorvastatin 40/ aspirin 81/ omeprazole 40/ ranitidine 150 bid/ sod bicarb  - gabapentin 100 bid/ mvi/ vitmains/ prns/ supplements   UA 9/18 > 21-50 wbc/ 0-5 rbc, neg protein  UCx pend, blood cx 1/2 neg, other pending   Impression: 1. CKD stage V - pt's renal function is at baseline.  Will get OP records.  If other medical problems stabilize believe she probably could be dc'd to f/u with her OP nerphologist.    2. UTI - on abx 3. AMS - improving w/ Rx of #2 4. HTN - cont meds 5. DM on insulin 6. Hx CVA's / TIA's 7. Diab retinopathy - w/ partial blindness 8. Debility - lives at an [facility] - no new suggestions.  Will follow up tomorrow.   Kelly Splinter MD [employer] pager [phone]   03/11/2018, 3:19 PM

## 2018-03-11 NOTE — Clinical Social Work Note (Signed)
Clinical Social Work Assessment  Patient Details  Name: [name] Michael MRN: [id number] Date of Birth: 1961-05-12  Date of referral:  03/11/18               Reason for consult:  Discharge Planning                Permission sought to share information with:    Permission granted to share information::     Name::        Agency::     Relationship::     Contact Information:     Housing/Transportation Living arrangements for the past 2 months:  Assisted Living Facility([facility]) Source of Information:  Siblings(Sister - Designer, television/film set) Patient Interpreter Needed:  None Criminal Activity/Legal Involvement Pertinent to Current Situation/Hospitalization:  No - Comment as needed Significant Relationships:  Siblings Lives with:  Facility Resident Do you feel safe going back to the place where you live?    Need for family participation in patient care:  Yes (Comment)  Care giving concerns:  Patient is a Illyria Michael term care resident at [facility]. Patient's sister reported that patient uses a cane at baseline and sometimes a walker when patient has a setback. Patient's sister reported patient requires assistance with ADLs at ALF.   Social Worker assessment / plan:  CSW spoke with patient's sister regarding discharge planning, patient only oriented to person and place and unable to participate in assessment at this time. Patient's sister reported that patient has been at [facility] for over a year and the plan is for patient to return. Patient's sister reported that she feels that in the future patient may need a higher level of care. CSW agreed to follow up with [facility] to confirm patient's ability to return when medically stable.   CSW contacted [facility] and left a voicemail for resident coordinator [name] Michael white, CSW awaiting return phone call.   CSW will continue to follow and assist with discharge planning.  Employment status:  Disabled (Comment on whether or not  currently receiving Disability) Insurance information:  Self Pay (Medicaid Pending) PT Recommendations:  Not assessed at this time Information / Referral to community resources:  Other (Comment Required)(Patient is a [facility] term care resident at [facility])  Patient/Family's Response to care:  Patient's appreciative of CSW assistance with discharge planning.   Patient/Family's Understanding of and Emotional Response to Diagnosis, Current Treatment, and Prognosis:  Patient disoriented to situation and unable to participate in assessment. Patient's sister involved in patient's care ad reported that she was going to visit patient today to learn more about patient's current treatment. CSW and patient's sister discussed patient's support system, Patient's sister reported that she was patient's only support system and that she would be moving soon. Patient's sister reported that she will come to visit patient but it wont be as often. Patient's sister verbalized plan for patient to discharge back to [facility].  Emotional Assessment Appearance:    Attitude/Demeanor/Rapport:  Unable to Assess Affect (typically observed):  Unable to Assess Orientation:  Oriented to Self, Oriented to Place Alcohol / Substance use:  Not Applicable Psych involvement (Current and /or in the community):  No (Comment)  Discharge Needs  Concerns to be addressed:  Care Coordination Readmission within the last 30 days:  No Current discharge risk:  Physical Impairment Barriers to Discharge:  Continued Medical Work up   [employer], LCSW 03/11/2018, 9:53 AM

## 2018-03-11 NOTE — Care Management Note (Signed)
Case Management Note  Patient Details  Name: [name] Michael MRN: [id number] Date of Birth: [birth year]  Subjective/Objective: UTI, hyperkalemia. Per CSW from [facility]. PT cons-await recc.                   Action/Plan:dc ALF   Expected Discharge Date:                  Expected Discharge Plan:  Assisted Living / Rest Home  In-House Referral:  Clinical Social Work  Discharge planning Services  CM Consult  Post Acute Care Choice:  Durable Medical Equipment(rw) Choice offered to:     DME Arranged:    DME Agency:     HH Arranged:    [place] Agency:     Status of Service:  In process, will continue to follow  If discussed at Long Length of Stay Meetings, dates discussed:    Additional Comments:  Dessa Phi, RN 03/11/2018, 11:17 AM

## 2018-03-11 NOTE — Evaluation (Addendum)
Physical Therapy Evaluation Patient Details Name: CAMIE HAUSS MRN: [id number] DOB: August 13, 1960 Today's Date: 03/11/2018   History of Present Illness  57 YO female admitted to ED on 9/18 for AMS, lethargy, fatigue, and L eye stye. Pt with medical diagnosis of lower UTI. PMH includes poorly controlled DM II, HLD, HTN, CVA of L frontal region in 2011 and another in 2018, TIAs, AKI, CAP, diabetic retinopathy, depression, HLD, CKD stage 5, pt reporting blindness. Past surgical history includes ORIF L distal radius fracture 2017.  Clinical Impression   Pt admitted for UTI. Pt appears to be at baseline physical functioning, requiring supervision for most mobility and only used min guard for transfers/ambulation for safety. Pt ambulated 100 ft in the hallway without difficulty. Pt's main complaints at this time are lethargy. Pt with no acute PT needs at this time. PT signing off, please consult in the future for any changes.     Follow Up Recommendations No PT follow up;Supervision for mobility/OOB    Equipment Recommendations  None recommended by PT    Recommendations for Other Services       Precautions / Restrictions Precautions Precautions: Fall Precaution Comments: Pt states she is blind and cannot see anything out of either eye. Pt tracks PT with eyes, watches television, and guides RW during session.  Restrictions Weight Bearing Restrictions: No      Mobility  Bed Mobility Overal bed mobility: Modified Independent             General bed mobility comments: Increased time and effort to perform bed mobility, especially pushing to sit and righting self.   Transfers Overall transfer level: Needs assistance Equipment used: Rolling walker (2 wheeled) Transfers: Sit to/from Stand Sit to Stand: Min guard         General transfer comment: Min guard for safety. Standig pericare with assist from nurse tech.   Ambulation/Gait Ambulation/Gait assistance: Min  guard;Supervision Gait Distance (Feet): 100 Feet Assistive device: Rolling walker (2 wheeled) Gait Pattern/deviations: Step-through pattern;Decreased stride length;Drifts right/left Gait velocity: slightly decr   General Gait Details: Pt requiring supervision to min guard for ambulation for safety. During ambulation, pt negotiated door frames and did not require very much cuing even though pt states she is blind.   Stairs            Wheelchair Mobility    Modified Rankin (Stroke Patients Only)       Balance Overall balance assessment: Mild deficits observed, not formally tested                                           Pertinent Vitals/Pain Pain Assessment: No/denies pain    Home Living Family/patient expects to be discharged to:: Assisted living               Home Equipment: Kasandra Knudsen - single point;Walker - 2 wheels Additional Comments: puts own clothes on, showers independently, pt with intermittent help from ALF staff.     Prior Function Level of Independence: Needs assistance   Gait / Transfers Assistance Needed: Pt ambulates with cane very short distances in room, also goes outside to smoke. Pt states she does not need help with this.   ADL's / Homemaking Assistance Needed: some assist for ADLs   Comments: history of falls      Hand Dominance   Dominant Hand: Right    Extremity/Trunk Assessment  Upper Extremity Assessment Upper Extremity Assessment: Overall WFL for tasks assessed    Lower Extremity Assessment Lower Extremity Assessment: Overall WFL for tasks assessed    Cervical / Trunk Assessment Cervical / Trunk Assessment: Normal  Communication   Communication: No difficulties(blind )  Cognition Arousal/Alertness: Awake/alert Behavior During Therapy: Flat affect Overall Cognitive Status: No family/caregiver present to determine baseline cognitive functioning Area of Impairment: Problem solving                              Problem Solving: Slow processing;Requires verbal cues;Requires tactile cues;Difficulty sequencing        General Comments      Exercises     Assessment/Plan    PT Assessment Patent does not need any further PT services  PT Problem List         PT Treatment Interventions      PT Goals (Current goals can be found in the Care Plan section)  Acute Rehab PT Goals PT Goal Formulation: With patient Time For Goal Achievement: 03/11/18 Potential to Achieve Goals: Good    Frequency     Barriers to discharge        Co-evaluation               AM-PAC PT "6 Clicks" Daily Activity  Outcome Measure Difficulty turning over in bed (including adjusting bedclothes, sheets and blankets)?: None Difficulty moving from lying on back to sitting on the side of the bed? : None Difficulty sitting down on and standing up from a chair with arms (e.g., wheelchair, bedside commode, etc,.)?: A Little Help needed moving to and from a bed to chair (including a wheelchair)?: A Little Help needed walking in hospital room?: A Little Help needed climbing 3-5 steps with a railing? : A Little 6 Click Score: 20    End of Session Equipment Utilized During Treatment: Gait belt Activity Tolerance: Patient tolerated treatment well;No increased pain Patient left: in chair;with chair alarm set;with call bell/phone within reach Nurse Communication: Mobility status      Time: 5883-2549 PT Time Calculation (min) (ACUTE ONLY): 34 min   Charges:   PT Evaluation $PT Eval Low Complexity: 1 Low          Chenay Nesmith Conception Chancy, PT Acute Rehabilitation Services Pager [phone]  Office [phone]   Jesseca Marsch D Elonda Husky 03/11/2018, 3:15 PM

## 2018-03-12 ENCOUNTER — Encounter (HOSPITAL_COMMUNITY): Payer: Self-pay

## 2018-03-12 LAB — BASIC METABOLIC PANEL
Anion gap: 16 — ABNORMAL HIGH (ref 5–15)
BUN: 58 mg/dL — AB (ref 6–20)
CALCIUM: 9.4 mg/dL (ref 8.9–10.3)
CO2: 23 mmol/L (ref 22–32)
Chloride: 102 mmol/L (ref 98–111)
Creatinine, Ser: 4.04 mg/dL — ABNORMAL HIGH (ref 0.44–1.00)
GFR calc Af Amer: 13 mL/min — ABNORMAL LOW (ref >60)
GFR, EST NON AFRICAN AMERICAN: 11 mL/min — AB (ref >60)
GLUCOSE: 180 mg/dL — AB (ref 70–99)
Potassium: 5.2 mmol/L — ABNORMAL HIGH (ref 3.5–5.1)
Sodium: 141 mmol/L (ref 135–145)

## 2018-03-12 LAB — POTASSIUM
POTASSIUM: 5.2 mmol/L — AB (ref 3.5–5.1)
Potassium: 5.4 mmol/L — ABNORMAL HIGH (ref 3.5–5.1)

## 2018-03-12 LAB — CBC
HCT: 33.3 % — ABNORMAL LOW (ref 36.0–46.0)
Hemoglobin: 10.8 g/dL — ABNORMAL LOW (ref 12.0–15.0)
MCH: 30.1 pg (ref 26.0–34.0)
MCHC: 32.4 g/dL (ref 30.0–36.0)
MCV: 92.8 fL (ref 78.0–100.0)
PLATELETS: 399 10*3/uL (ref 150–400)
RBC: 3.59 MIL/uL — ABNORMAL LOW (ref 3.87–5.11)
RDW: 13 % (ref 11.5–15.5)
WBC: 9.6 10*3/uL (ref 4.0–10.5)

## 2018-03-12 LAB — URINE CULTURE: Special Requests: NORMAL

## 2018-03-12 LAB — GLUCOSE, CAPILLARY
GLUCOSE-CAPILLARY: 149 mg/dL — AB (ref 70–99)
GLUCOSE-CAPILLARY: 150 mg/dL — AB (ref 70–99)
Glucose-Capillary: 121 mg/dL — ABNORMAL HIGH (ref 70–99)
Glucose-Capillary: 251 mg/dL — ABNORMAL HIGH (ref 70–99)
Glucose-Capillary: 140 mg/dL — ABNORMAL HIGH (ref 70–99)
Glucose-Capillary: 126 mg/dL — ABNORMAL HIGH (ref 70–99)
Glucose-Capillary: 128 mg/dL — ABNORMAL HIGH (ref 70–99)

## 2018-03-12 LAB — HEMOGLOBIN A1C
HEMOGLOBIN A1C: 7.4 % — AB (ref 4.8–5.6)
MEAN PLASMA GLUCOSE: 165.68 mg/dL

## 2018-03-12 MED ORDER — SODIUM POLYSTYRENE SULFONATE 15 GM/60ML PO SUSP
30.0000 g | Freq: Once | ORAL | Status: AC
  Administered 2018-03-12: 30 g via ORAL
  Filled 2018-03-12 (×2): qty 120

## 2018-03-12 MED ORDER — FAMOTIDINE 20 MG PO TABS
20.0000 mg | ORAL_TABLET | Freq: Every day | ORAL | Status: DC
  Administered 2018-03-13: 20 mg via ORAL
  Filled 2018-03-12: qty 1

## 2018-03-12 MED ORDER — SODIUM POLYSTYRENE SULFONATE 15 GM/60ML PO SUSP
30.0000 g | Freq: Once | ORAL | Status: AC
  Administered 2018-03-12: 30 g via ORAL
  Filled 2018-03-12: qty 120

## 2018-03-12 MED ORDER — CEPHALEXIN 250 MG PO CAPS
250.0000 mg | ORAL_CAPSULE | Freq: Every day | ORAL | Status: DC
  Administered 2018-03-12: 250 mg via ORAL
  Filled 2018-03-12: qty 1

## 2018-03-12 NOTE — NC FL2 (Addendum)
[place] MEDICAID FL2 LEVEL OF CARE SCREENING TOOL     IDENTIFICATION  Patient Name: [name] Michael Birthdate: August 19, 1960 Sex: female Admission Date (Current Location): 03/10/2018  [facility] and [place] Number:  Herbalist and Address:  [facility],  [place] [address], [place]      Provider Number: [id number]  Attending Physician Name and Address:  Patrecia Pour, Christean Grief, MD  Relative Name and Phone Number:       Current Level of Care: Hospital Recommended Level of Care: [place] Prior Approval Number:    Date Approved/Denied:   PASRR Number:    Discharge Plan: Other (Comment)(Assisted Living Facility)    Current Diagnoses: Patient Active Problem List   Diagnosis Date Noted  . CKD (chronic kidney disease) stage 5, GFR less than 15 ml/min (HCC) 03/10/2018  . Acute encephalopathy 03/10/2018  . Hypermagnesemia 03/10/2018  . AKI (acute kidney injury) ([place]) 11/09/2016  . Hypercalcemia 11/09/2016  . Hyponatremia 11/09/2016  . Hypovolemia 11/09/2016  . Accelerated hypertension 11/09/2016  . Acute lower UTI 11/09/2016  . Nephrolithiasis 10/24/2016  . Constipation 10/22/2016  . CAP (community acquired pneumonia) 10/22/2016  . Hydronephrosis of right kidney 10/22/2016  . Metabolic acidosis 08/65/7846  . CVA (cerebral vascular accident) ([place]) 07/03/2016  . HAP (hospital-acquired pneumonia) 07/03/2016  . Sepsis secondary to UTI ([place])   . UTI due to extended-spectrum beta lactamase (ESBL) producing Escherichia coli   . Acute pyelonephritis   . Bacteremia due to Escherichia coli   . Colitis, indeterminate   . Uncontrolled type 2 diabetes mellitus with complication ([place])   . 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 associated with diabetes mellitus due to underlying condition ([place])   . Chronic diastolic CHF (congestive heart failure) ([place])   . Abdominal pain   . Colitis   . Hypophosphatemia 06/12/2016  . Hypokalemia  06/11/2016  . Hypocalcemia 06/11/2016  . Acute kidney injury superimposed on chronic kidney disease ([place]) 06/11/2016  . Proliferative diabetic retinopathy ([place]) 04/29/2016  . Closed fracture of left distal radius 01/28/2016  . Poor social situation 03/09/2013  . Depression 03/01/2013  . Abnormal mammogram 12/20/2012  . Retinal detachment 11/17/2012  . 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 ([place]) 96/29/5284  . Hyperlipidemia 02/09/2007  . Essential hypertension 02/09/2007  . GERD 02/09/2007    Orientation RESPIRATION BLADDER Height & Weight     Self, Time, Situation, Place  Normal Incontinent Weight: 106 lb 0.7 oz (48.1 kg) Height:  4\' 6"  (137.2 cm)  BEHAVIORAL SYMPTOMS/MOOD NEUROLOGICAL BOWEL NUTRITION STATUS        Diet(regular)  AMBULATORY STATUS COMMUNICATION OF NEEDS Skin   Supervision Verbally Normal                       Personal Care Assistance Level of Assistance  Bathing, Feeding, Dressing Bathing Assistance: Limited assistance Feeding assistance: Independent Dressing Assistance: Limited assistance     Functional Limitations Info  Sight, Hearing, Speech Sight Info: Adequate Hearing Info: Adequate Speech Info: Adequate    SPECIAL CARE FACTORS FREQUENCY                       Contractures      Additional Factors Info  Code Status, Allergies Code Status Info: Full Code Allergies Info: Hydrocodone           Current Medications (03/12/2018):  This is the current hospital active medication list Current Facility-Administered Medications  Medication Dose Route Frequency Provider Last Rate Last Dose  . 0.9 %  sodium chloride infusion   Intravenous PRN Etta Quill, DO   Stopped at 03/12/18 0028  . acetaminophen (TYLENOL) tablet 650 mg  650 mg Oral Q6H PRN Etta Quill, DO       Or  . acetaminophen (TYLENOL) suppository 650 mg  650 mg Rectal Q6H PRN Etta Quill, DO      . amLODipine (NORVASC) tablet 10 mg   10 mg Oral Daily Jennette Kettle M, DO   10 mg at 03/12/18 0944  . aspirin EC tablet 81 mg  81 mg Oral Daily Etta Quill, DO   81 mg at 03/12/18 0944  . atorvastatin (LIPITOR) tablet 40 mg  40 mg Oral q1800 Etta Quill, DO   40 mg at 03/11/18 1714  . calcium carbonate (TUMS - dosed in mg elemental calcium) chewable tablet 800 mg of elemental calcium  800 mg of elemental calcium Oral TID PRN Etta Quill, DO      . cephALEXin (KEFLEX) capsule 250 mg  250 mg Oral q1800 Patrecia Pour, Christean Grief, MD      . Derrill Memo ON 03/13/2018] famotidine (PEPCID) tablet 20 mg  20 mg Oral Daily Patrecia Pour, Christean Grief, MD      . furosemide (LASIX) tablet 20 mg  20 mg Oral Daily Patrecia Pour, Christean Grief, MD   20 mg at 03/12/18 0943  . gabapentin (NEURONTIN) capsule 100 mg  100 mg Oral BID Jennette Kettle M, DO   100 mg at 03/12/18 0944  . heparin injection 5,000 Units  5,000 Units Subcutaneous Q8H Etta Quill, DO   5,000 Units at 03/12/18 0645  . hydrALAZINE (APRESOLINE) tablet 25 mg  25 mg Oral TID Etta Quill, DO   25 mg at 03/12/18 0944  . Influenza vac split quadrivalent PF (FLUARIX) injection 0.5 mL  0.5 mL Intramuscular Tomorrow-1000 Jennette Kettle M, DO      . insulin aspart (novoLOG) injection 0-9 Units  0-9 Units Subcutaneous Q4H Jennette Kettle M, DO   1 Units at 03/12/18 0830  . insulin glargine (LANTUS) injection 20 Units  20 Units Subcutaneous Daily Etta Quill, DO   20 Units at 03/12/18 0944  . lidocaine (LIDODERM) 5 % 1 patch  1 patch Transdermal Q24H Etta Quill, DO   1 patch at 03/11/18 2219  . metoprolol tartrate (LOPRESSOR) tablet 100 mg  100 mg Oral BID Jennette Kettle M, DO   100 mg at 03/12/18 0943  . multivitamin with minerals tablet 1 tablet  1 tablet Oral Daily Jennette Kettle M, DO   1 tablet at 03/12/18 0944  . mupirocin ointment (BACTROBAN) 2 % 1 application  1 application Nasal BID Etta Quill, DO   1 application at 78/24/23 [phone]  . ondansetron (ZOFRAN) tablet 4 mg  4 mg  Oral Q6H PRN Etta Quill, DO       Or  . ondansetron [facility]) injection 4 mg  4 mg Intravenous Q6H PRN Etta Quill, DO      . pantoprazole (PROTONIX) EC tablet 80 mg  80 mg Oral Daily Jennette Kettle M, DO   80 mg at 03/12/18 0944  . polyethylene glycol (MIRALAX / GLYCOLAX) packet 17 g  17 g Oral Daily Jennette Kettle M, DO   17 g at 03/11/18 0940  . sodium bicarbonate tablet 650 mg  650 mg Oral BID Jennette Kettle M, DO   650 mg  at 03/12/18 0947     Discharge Medications: acetaminophen 325 MG tablet Commonly known as:  TYLENOL Take 2 tablets (650 mg total) by mouth every 6 (six) hours as needed for mild pain (or Fever >/= 101).   aspirin 81 MG tablet Take 1 tablet (81 mg total) by mouth daily.   atorvastatin 40 MG tablet Commonly known as:  LIPITOR Take 40 mg by mouth daily.   calcium carbonate 500 MG chewable tablet Commonly known as:  TUMS - dosed in mg elemental calcium Chew 4 tablets (800 mg of elemental calcium total) by mouth 3 (three) times daily. What changed:    when to take this  reasons to take this   cephALEXin 250 MG capsule Commonly known as:  KEFLEX Take 1 capsule (250 mg total) by mouth daily at 6 PM.   CERTAVITE SENIOR/ANTIOXIDANT Tabs Take 1 tablet by mouth daily.   diphenhydrAMINE 25 MG tablet Commonly known as:  BENADRYL Take 25 mg by mouth every 4 (four) hours as needed for itching.   furosemide 20 MG tablet Commonly known as:  LASIX Take 20 mg by mouth daily.   gabapentin 100 MG capsule Commonly known as:  NEURONTIN Take 100 mg by mouth 2 (two) times daily.   hydrALAZINE 25 MG tablet Commonly known as:  APRESOLINE Take 1 tablet (25 mg total) by mouth 3 (three) times daily.   insulin aspart 100 UNIT/ML injection Commonly known as:  novoLOG Inject 8 Units into the skin 3 (three) times daily with meals.   insulin glargine 100 UNIT/ML injection Commonly known as:  LANTUS Inject 0.24 mLs (24 Units total) into the skin  daily. What changed:  how much to take   lidocaine 5 % Commonly known as:  LIDODERM Place 1 patch onto the skin daily. Remove & Discard patch within 12 hours or as directed by MD   metoprolol tartrate 100 MG tablet Commonly known as:  LOPRESSOR Take 100 mg by mouth 2 (two) times daily.   omeprazole 40 MG capsule Commonly known as:  PRILOSEC Take 40 mg by mouth daily.   polyethylene glycol packet Commonly known as:  MIRALAX / GLYCOLAX Take 17 g by mouth daily.   PROBIOTIC PO Take 1 tablet by mouth daily.   ranitidine 150 MG tablet Commonly known as:  ZANTAC Take 1 tablet (150 mg total) by mouth 2 (two) times daily.   sodium bicarbonate 650 MG tablet Take 1 tablet (650 mg total) by mouth 2 (two) times daily.   Vitamin D (Ergocalciferol) 50000 units Caps capsule Commonly known as:  DRISDOL Take 1 capsule (50,000 Units total) by mouth every 7 (seven) days.     Relevant Imaging Results:  Relevant Lab Results:   Additional Information SSN: [id number]  Burnis Medin, LCSW

## 2018-03-12 NOTE — Progress Notes (Signed)
[facility] Progress Note  Subjective: no c/o, feeling better  Vitals:   03/11/18 2041 03/12/18 0444 03/12/18 0942 03/12/18 1354  BP: (!) 154/73 139/71 (!) 145/72 136/88  Pulse: 75 77 71 73  Resp: _0 Temp: 97.6 F (36.4 C) 98.4 F (36.9 C)  98 F (36.7 C)  TempSrc: Oral Oral  Oral  SpO2: 100% 100% 100% 97%  Weight:      Height:        Inpatient medications: . amLODipine  10 mg Oral Daily  . aspirin EC  81 mg Oral Daily  . atorvastatin  40 mg Oral q1800  . cephALEXin  250 mg Oral q1800  . [START ON 03/13/2018] famotidine  20 mg Oral Daily  . furosemide  20 mg Oral Daily  . gabapentin  100 mg Oral BID  . heparin  5,000 Units Subcutaneous Q8H  . hydrALAZINE  25 mg Oral TID  . insulin aspart  0-9 Units Subcutaneous Q4H  . insulin glargine  20 Units Subcutaneous Daily  . lidocaine  1 patch Transdermal Q24H  . metoprolol tartrate  100 mg Oral BID  . multivitamin with minerals  1 tablet Oral Daily  . mupirocin ointment  1 application Nasal BID  . pantoprazole  80 mg Oral Daily  . polyethylene glycol  17 g Oral Daily  . sodium bicarbonate  650 mg Oral BID   . sodium chloride Stopped (03/12/18 0028)   sodium chloride, acetaminophen **OR** acetaminophen, calcium carbonate, ondansetron **OR** ondansetron (ZOFRAN) IV  Iron/TIBC/Ferritin/ %Sat    Component Value Date/Time   IRON 116 11/18/2016 0833   TIBC 182 (L) 11/18/2016 6004   FERRITIN 2,875 (H) 11/18/2016 0833   IRONPCTSAT 64 (H) 11/18/2016 5997    Exam: Gen small framed female, pleasant , smiling Chest clear R, rales L base RRR no MRG Abd firm, protuberant, nontender, +bs, no hsm GU defer MS no joint effusions or deformity Ext no LE or UE edema Neuro is alert, Ox 3 , nf   date                Creat               egfr  2012- 14         0.4- 0.5  2017               2.0- 2.9  early 10/2016   2.5- 3.2  10/2016            9.5 >> 4.3        aki episode  11/2016            4.08                  11  02/2017            4.39                 10  10/2017            4.53  03/10/18           4.28                 11  03/11/18           4.17                 11    Home meds:  - amlodipine 10/ furosemide 20/ hydralazine 25 tid/ metoprolol 100 bid  -  insulin glargine 40 qd/ insulin aspart 8 tid  - atorvastatin 40/ aspirin 81/ omeprazole 40/ ranitidine 150 bid/ sod bicarb  - gabapentin 100 bid/ mvi/ vitmains/ prns/ supplements   UA 9/18 > 21-50 wbc/ 0-5 rbc, neg protein  UCx pend, blood cx 1/2 neg, other pending   Impression: 1. CKD stage V - pt's renal function is at baseline.  Not uremic. No specific suggestions. She is f/b Dr Florene Glen at [facility].    2. UTI - on abx 3. AMS - improved w/ Rx of #2 4. HTN - cont meds 5. DM on insulin 6. Hx CVA's / TIA's 7. Diab retinopathy - w/ partial blindness 8. Debility - lives at an [facility] - will sign off   Kelly Splinter MD [place] pager [phone]   03/12/2018, 2:37 PM   Recent Labs  Lab 03/10/18 2027 03/11/18 0504  03/12/18 0228 03/12/18 0530  NA 140 142  --   --   --   K 5.4* 6.0*   < > 5.2* 5.4*  CL 102 106  --   --   --   CO2 26 24  --   --   --   GLUCOSE 107* 169*  --   --   --   BUN 53* 49*  --   --   --   CREATININE 4.28* 4.17*  --   --   --   CALCIUM 9.3 8.8*  --   --   --   ALBUMIN 4.0  --   --   --   --    < > = values in this interval not displayed.   Recent Labs  Lab 03/10/18 2027  AST 12*  ALT 12  ALKPHOS 87  BILITOT 0.4  PROT 8.4*   Recent Labs  Lab 03/10/18 2027 03/11/18 0504 03/12/18 1419  WBC 9.0 11.1* 9.6  NEUTROABS 5.8  --   --   HGB 9.6* 9.3* 10.8*  HCT 30.1* 29.5* 33.3*  MCV 93.8 94.2 92.8  PLT 413* 369 399

## 2018-03-12 NOTE — Progress Notes (Addendum)
PROGRESS NOTE [facility]   [name] Michael   WCB:[id number] DOB: 02-Jun-1961  DOA: 03/10/2018 PCP: Charlott Rakes, MD   Brief Narrative:  [name] Michael is a 57 year old female with medical history CKD stage V not on dialysis, DM type II, hypertension and prior stroke.  Patient was sent to the ED from ALF due to lethargy, fatigue and altered mental status.  Upon ED evaluation patient was found to be confused, UA suggestive of UTI.  Lab work-up revealed increasing creatinine from baseline, elevated potassium and patient was admitted with working diagnosis of UTI complicated by hyperkalemia.   Subjective: Patient feeling well, has no complaints today.  Mental status back to baseline.  Remains afebrile.  Denies abdominal pain, nausea and vomiting.  Potassium still slightly elevated.  Assessment & Plan: Acute metabolic encephalopathy In setting of suspected UTI AKI with hypomagnesemia and hyperkalemia. Status back to baseline.  Following commands well. Follow TSH, B12 and vitamin D.  Treat underlying causes.   UTI UA grossly abnormal, patient started on empiric Rocephin. Urine cultures with multiple species. De-escalate to keflex.   CKD stage V Creatinine at baseline, case discussed with nephrology advise follow-up as an outpatient (Dr. Florene Glen) Good urine output.   Hyperkalemia/hypermagnesemia Remain hyperkalemic, despite Kayexalate x2.  Last K5.2.  Given patient lack of follow-up and not compliant with PCP visits or renal follow-up will give another dose of Kayexalate tonight check potassium in a.m. if normal can DC and follow-up as an outpatient with nephrology.  Monitor magnesium and BMP in a.m.  Hypertension BP stable Continue amlodipine and hydralazine as needed  Diabetes mellitus type 2 CBGs stable Continue SSI is on Lantus as ordered.  A1c pending  DVT prophylaxis: Heparin [place] Code Status: Full code Family Communication: None at bedside Disposition Plan: Home when  hyperkalemia improved   Consultants:   None  Procedures:   None  Antimicrobials: Anti-infectives (From admission, onward)   Start     Dose/Rate Route Frequency Ordered Stop   03/12/18 1800  cephALEXin (KEFLEX) capsule 250 mg     250 mg Oral Daily-1800 03/12/18 1030     03/11/18 2200  cefTRIAXone (ROCEPHIN) 1 g in sodium chloride 0.9 % 100 mL IVPB  Status:  Discontinued     1 g 200 mL/hr over 30 Minutes Intravenous Every 24 hours 03/10/18 2147 03/12/18 1003   03/10/18 2145  cefTRIAXone (ROCEPHIN) 1 g in sodium chloride 0.9 % 100 mL IVPB     1 g 200 mL/hr over 30 Minutes Intravenous  Once 03/10/18 2138 03/10/18 2327      Objective: Vitals:   03/11/18 2041 03/12/18 0444 03/12/18 0942 03/12/18 1354  BP: (!) 154/73 139/71 (!) 145/72 136/88  Pulse: 75 77 71 73  Resp: 16 18  18   Temp: 97.6 F (36.4 C) 98.4 F (36.9 C)  98 F (36.7 C)  TempSrc: Oral Oral  Oral  SpO2: 100% 100% 100% 97%  Weight:      Height:        Intake/Output Summary (Last 24 hours) at 03/12/2018 1509 Last data filed at 03/12/2018 1405 Gross per 24 hour  Intake 815.65 ml  Output -  Net 815.65 ml   Filed Weights   03/10/18 2318  Weight: 48.1 kg    Examination:  General: Pt is alert, awake, not in acute distress Cardiovascular: RRR, S1/S2 +, no rubs, no gallops Respiratory: CTA bilaterally, no wheezing, no rhonchi Abdominal: Soft, NT, ND, bowel sounds + Extremities: no edema  Data Reviewed:  I have personally reviewed following labs and imaging studies  CBC: Recent Labs  Lab 03/10/18 2027 03/11/18 0504 03/12/18 1419  WBC 9.0 11.1* 9.6  NEUTROABS 5.8  --   --   HGB 9.6* 9.3* 10.8*  HCT 30.1* 29.5* 33.3*  MCV 93.8 94.2 92.8  PLT 413* 369 638   Basic Metabolic Panel: Recent Labs  Lab 03/10/18 2027 03/10/18 2157 03/11/18 0504  03/11/18 1816 03/11/18 2231 03/12/18 0228 03/12/18 0530 03/12/18 1419  NA 140  --  142  --   --   --   --   --  141  K 5.4*  --  6.0*   < > 6.1* 5.5* 5.2*  5.4* 5.2*  CL 102  --  106  --   --   --   --   --  102  CO2 26  --  24  --   --   --   --   --  23  GLUCOSE 107*  --  169*  --   --   --   --   --  180*  BUN 53*  --  49*  --   --   --   --   --  58*  CREATININE 4.28*  --  4.17*  --   --   --   --   --  4.04*  CALCIUM 9.3  --  8.8*  --   --   --   --   --  9.4  MG  --  3.4*  --   --   --   --   --   --   --    < > = values in this interval not displayed.   GFR: Estimated Creatinine Clearance: 9.3 mL/min (A) (by C-G formula based on SCr of 4.04 mg/dL (H)). Liver Function Tests: Recent Labs  Lab 03/10/18 2027  AST 12*  ALT 12  ALKPHOS 87  BILITOT 0.4  PROT 8.4*  ALBUMIN 4.0   Recent Labs  Lab 03/10/18 2027  LIPASE 32   Recent Labs  Lab 03/10/18 2027  AMMONIA 27   Coagulation Profile: No results for input(s): INR, PROTIME in the last 168 hours. Cardiac Enzymes: No results for input(s): CKTOTAL, CKMB, CKMBINDEX, TROPONINI in the last 168 hours. BNP (last 3 results) No results for input(s): PROBNP in the last 8760 hours. HbA1C: No results for input(s): HGBA1C in the last 72 hours. CBG: Recent Labs  Lab 03/11/18 2037 03/12/18 0001 03/12/18 0442 03/12/18 0751 03/12/18 1132  GLUCAP 132* 150* 149* 121* 251*   Lipid Profile: No results for input(s): CHOL, HDL, LDLCALC, TRIG, CHOLHDL, LDLDIRECT in the last 72 hours. Thyroid Function Tests: No results for input(s): TSH, T4TOTAL, FREET4, T3FREE, THYROIDAB in the last 72 hours. Anemia Panel: No results for input(s): VITAMINB12, FOLATE, FERRITIN, TIBC, IRON, RETICCTPCT in the last 72 hours. Sepsis Labs: Recent Labs  Lab 03/10/18 2034  LATICACIDVEN 0.99    Recent Results (from the past 240 hour(s))  Culture, blood (routine x 2)     Status: None (Preliminary result)   Collection Time: 03/10/18  8:27 PM  Result Value Ref Range Status   Specimen Description   Final    BLOOD LEFT ANTECUBITAL Performed at [place] [address]  [place], [place] [address]    Special Requests   Final    BOTTLES DRAWN AEROBIC AND ANAEROBIC Blood Culture results may not be optimal due to an excessive volume of  blood received in culture bottles Performed at [facility], [place] [address] [place], [place] [address]    Culture   Final    NO GROWTH 2 DAYS Performed at [facility] [address] [place], [place] [address]    Report Status PENDING  Incomplete  Urine culture     Status: Abnormal   Collection Time: 03/10/18  9:38 PM  Result Value Ref Range Status   Specimen Description   Final    URINE, CLEAN CATCH Performed at [facility], [place] [address] [place], [place] [address]    Special Requests   Final    Normal Performed at [facility], [place] [address] [place], [place] [address]    Culture MULTIPLE SPECIES PRESENT, SUGGEST RECOLLECTION (A)  Final   Report Status 03/12/2018 FINAL  Final  MRSA PCR Screening     Status: Abnormal   Collection Time: 03/10/18 11:29 PM  Result Value Ref Range Status   MRSA by PCR POSITIVE (A) NEGATIVE Final    Comment:        The GeneXpert MRSA Assay (FDA approved for NASAL specimens only), is one component of a comprehensive MRSA colonization surveillance program. It is not intended to diagnose MRSA infection nor to guide or monitor treatment for MRSA infections. RESULT CALLED TO, READ BACK BY AND VERIFIED WITHJanee Morn RN AT 1749 03/11/18 BY TIBBITTS,K Performed at [facility], [place] [address] [place], [place] [address]   Culture, blood (routine x 2)     Status: None (Preliminary result)   Collection Time: 03/11/18  5:04 AM  Result Value Ref Range Status   Specimen Description   Final    BLOOD LEFT ANTECUBITAL Performed at [place] [address], [place], [place] [address]    Special Requests   Final    BOTTLES DRAWN AEROBIC ONLY Blood Culture adequate volume Performed at  [place] [address], [place], [place] [address]    Culture   Final    NO GROWTH < 24 HOURS Performed at [facility] [address] [place], [place] [address]    Report Status PENDING  Incomplete      Radiology Studies: Dg Chest 1 View  Result Date: 03/10/2018 CLINICAL DATA:  Altered mental status, lethargy and fatigue. EXAM: CHEST  1 VIEW COMPARISON:  None. FINDINGS: The heart size and mediastinal contours are within normal limits. There is no evidence of pulmonary edema, consolidation, pneumothorax, nodule or pleural fluid. The visualized skeletal structures are unremarkable. IMPRESSION: No active disease. Electronically Signed   By: Aletta Edouard M.D.   On: 03/10/2018 21:32   Ct Head Wo Contrast  Result Date: 03/10/2018 CLINICAL DATA:  Altered mental status, lethargy and fatigue. EXAM: CT HEAD WITHOUT CONTRAST TECHNIQUE: Contiguous axial images were obtained from the base of the skull through the vertex without intravenous contrast. COMPARISON:  11/11/2017 FINDINGS: Brain: Stable chronic small vessel ischemic disease in the periventricular white matter and old lacunar infarcts in both basal ganglia. The brain demonstrates no evidence of hemorrhage, acute infarction, edema, mass effect, extra-axial fluid collection, hydrocephalus or mass lesion. Vascular: No hyperdense vessel or unexpected calcification. Skull: Normal. Negative for fracture or focal lesion. Sinuses/Orbits: No acute finding. Other: None. IMPRESSION: Stable chronic small vessel disease and bilateral old lacunar infarcts in the basal ganglia. No acute findings by head CT. Electronically Signed   By: Aletta Edouard M.D.   On: 03/10/2018  21:54   US Renal  Result Date: 03/10/2018 CLINICAL DATA:  Chronic kidney disease, UTI EXAM: RENAL / URINARY TRACT ULTRASOUND COMPLETE COMPARISON:  05/01/2017 FINDINGS: Right Kidney: Length: 10 cm. Slightly echogenic cortex. No hydronephrosis or mass Left  Kidney: Length: 10 cm. Slightly echogenic cortex. No hydronephrosis or mass Bladder: Appears normal for degree of bladder distention. IMPRESSION: 1. Echogenic cortex bilaterally consistent with medical renal disease. No hydronephrosis Electronically Signed   By: Donavan Foil M.D.   On: 03/10/2018 23:38    Scheduled Meds: . amLODipine  10 mg Oral Daily  . aspirin EC  81 mg Oral Daily  . atorvastatin  40 mg Oral q1800  . cephALEXin  250 mg Oral q1800  . [START ON 03/13/2018] famotidine  20 mg Oral Daily  . furosemide  20 mg Oral Daily  . gabapentin  100 mg Oral BID  . heparin  5,000 Units Subcutaneous Q8H  . hydrALAZINE  25 mg Oral TID  . insulin aspart  0-9 Units Subcutaneous Q4H  . insulin glargine  20 Units Subcutaneous Daily  . lidocaine  1 patch Transdermal Q24H  . metoprolol tartrate  100 mg Oral BID  . multivitamin with minerals  1 tablet Oral Daily  . mupirocin ointment  1 application Nasal BID  . pantoprazole  80 mg Oral Daily  . polyethylene glycol  17 g Oral Daily  . sodium bicarbonate  650 mg Oral BID   Continuous Infusions: . sodium chloride Stopped (03/12/18 0028)     LOS: 1 day    Time spent: Total of 25 minutes spent with pt, greater than 50% of which was spent in discussion of  treatment, counseling and coordination of care   Chipper Oman, MD Pager: Text Page via [email or web address]   If 7PM-7AM, please contact night-coverage [email or web address] 03/12/2018, 3:09 PM   Note - This record has been created using [employer]. Chart creation errors have been sought, but may not always have been located. Such creation errors do not reflect on the standard of medical care.

## 2018-03-13 DIAGNOSIS — N390 Urinary tract infection, site not specified: Principal | ICD-10-CM

## 2018-03-13 LAB — TSH: TSH: 4.781 u[IU]/mL — AB (ref 0.350–4.500)

## 2018-03-13 LAB — BASIC METABOLIC PANEL
Anion gap: 15 (ref 5–15)
BUN: 58 mg/dL — ABNORMAL HIGH (ref 6–20)
CALCIUM: 9.1 mg/dL (ref 8.9–10.3)
CO2: 26 mmol/L (ref 22–32)
Chloride: 104 mmol/L (ref 98–111)
Creatinine, Ser: 4.13 mg/dL — ABNORMAL HIGH (ref 0.44–1.00)
GFR calc Af Amer: 13 mL/min — ABNORMAL LOW (ref >60)
GFR calc non Af Amer: 11 mL/min — ABNORMAL LOW (ref >60)
GLUCOSE: 122 mg/dL — AB (ref 70–99)
Potassium: 4.2 mmol/L (ref 3.5–5.1)
Sodium: 145 mmol/L (ref 135–145)

## 2018-03-13 LAB — GLUCOSE, CAPILLARY
GLUCOSE-CAPILLARY: 99 mg/dL (ref 70–99)
Glucose-Capillary: 122 mg/dL — ABNORMAL HIGH (ref 70–99)

## 2018-03-13 LAB — MAGNESIUM: Magnesium: 2.7 mg/dL — ABNORMAL HIGH (ref 1.7–2.4)

## 2018-03-13 LAB — VITAMIN B12: Vitamin B-12: 497 pg/mL (ref 180–914)

## 2018-03-13 MED ORDER — ACETAMINOPHEN 325 MG PO TABS: 650.0000 mg | ORAL_TABLET | Freq: Four times a day (QID) | ORAL | Status: DC | PRN

## 2018-03-13 MED ORDER — CEPHALEXIN 250 MG PO CAPS: 250.0000 mg | ORAL_CAPSULE | Freq: Every day | ORAL | 0 refills | Status: DC

## 2018-03-13 NOTE — Progress Notes (Signed)
Patient discharging back to [facility]. CSW confirmed ability to return with facility and faxed appropriate documents.  [facility] has been called for transport.  RN call report to: [phone].  No more CSW needs. Signing off.   Pricilla Holm, MSW, [place] [facility] [phone]

## 2018-03-13 NOTE — Discharge Summary (Signed)
Physician Discharge Summary  [name] Michael KCL:[id number] DOB: 1960/11/06 DOA: 03/10/2018  PCP: Charlott Rakes, MD  Admit date: 03/10/2018 Discharge date: 03/13/2018  Admitted From:AL Disposition:AL Recommendations for Outpatient Follow-up:  1. Follow up with PCP in 1-2 weeks 2. Please obtain BMP/CBC in one week  Home Health NONE Equipment/Devices: NONE  Discharge Condition STABLE CODE STATUS: FULL Diet recommendation: RENAL   Brief/Interim Summary:57 year old female with medical history CKD stage V not on dialysis, DM type II, hypertension and prior stroke.  Patient was sent to the ED from ALF due to lethargy, fatigue and altered mental status.  Upon ED evaluation patient was found to be confused, UA suggestive of UTI.  Lab work-up revealed increasing creatinine from baseline, elevated potassium and patient was admitted with working diagnosis of UTI complicated by hyperkalemia.    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 Status back to baseline.  Following commands well.  UTI Treated with rocephin.will dc on keflex.  CKD stage V Creatinine at baseline, case discussed with nephrology advise follow-up as an outpatient (Dr. Florene Glen) Good urine output.   Hyperkalemia/hypermagnesemia-resolved  Hypertension BP stable Continue lasix and hydralazine.holding norvasc due to soft BP.  Diabetes mellitus type 2 CBGs stable Continue lantus   Discharge Instructions  Discharge Instructions    Call MD for:  difficulty breathing, headache or visual disturbances   Complete by:  As  directed    Call MD for:  persistant dizziness or light-headedness   Complete by:  As directed    Call MD for:  persistant nausea and vomiting   Complete by:  As directed    Call MD for:  severe uncontrolled pain   Complete by:  As directed    Diet - low sodium heart healthy   Complete by:  As directed    Increase activity slowly   Complete by:  As directed      Allergies as of 03/13/2018      Reactions   Hydrocodone Nausea And Vomiting      Medication List    STOP taking these medications   amLODipine 10 MG tablet Commonly known as:  NORVASC     TAKE these medications   acetaminophen 325 MG tablet Commonly known as:  TYLENOL Take 2 tablets (650 mg total) by mouth every 6 (six) hours as needed for mild pain (or Fever >/= 101).   aspirin 81 MG tablet Take 1 tablet (81 mg total) by mouth daily.   atorvastatin 40 MG tablet Commonly known as:  LIPITOR Take 40 mg by mouth daily.   calcium carbonate 500 MG chewable tablet Commonly known as:  TUMS - dosed in mg elemental calcium Chew 4 tablets (800 mg of elemental calcium total) by mouth 3 (three) times daily. What changed:    when to take this  reasons to take this   cephALEXin 250 MG capsule Commonly known as:  KEFLEX Take 1 capsule (250 mg total) by mouth daily at 6 PM.   CERTAVITE SENIOR/ANTIOXIDANT Tabs Take 1 tablet by mouth daily.   diphenhydrAMINE 25 MG tablet Commonly known as:  BENADRYL Take 25 mg by mouth every 4 (four)  hours as needed for itching.   furosemide 20 MG tablet Commonly known as:  LASIX Take 20 mg by mouth daily.   gabapentin 100 MG capsule Commonly known as:  NEURONTIN Take 100 mg by mouth 2 (two) times daily.   hydrALAZINE 25 MG tablet Commonly known as:  APRESOLINE Take 1 tablet (25 mg total) by mouth 3 (three) times daily.   insulin aspart 100 UNIT/ML injection Commonly known as:  novoLOG Inject 8 Units into the skin 3 (three) times daily with meals.   insulin glargine 100  UNIT/ML injection Commonly known as:  LANTUS Inject 0.24 mLs (24 Units total) into the skin daily. What changed:  how much to take   lidocaine 5 % Commonly known as:  LIDODERM Place 1 patch onto the skin daily. Remove & Discard patch within 12 hours or as directed by MD   metoprolol tartrate 100 MG tablet Commonly known as:  LOPRESSOR Take 100 mg by mouth 2 (two) times daily.   omeprazole 40 MG capsule Commonly known as:  PRILOSEC Take 40 mg by mouth daily.   polyethylene glycol packet Commonly known as:  MIRALAX / GLYCOLAX Take 17 g by mouth daily.   PROBIOTIC PO Take 1 tablet by mouth daily.   ranitidine 150 MG tablet Commonly known as:  ZANTAC Take 1 tablet (150 mg total) by mouth 2 (two) times daily.   sodium bicarbonate 650 MG tablet Take 1 tablet (650 mg total) by mouth 2 (two) times daily.   Vitamin D (Ergocalciferol) 50000 units Caps capsule Commonly known as:  DRISDOL Take 1 capsule (50,000 Units total) by mouth every 7 (seven) days.       Allergies  Allergen Reactions  . Hydrocodone Nausea And Vomiting    Consultations: none Procedures/Studies: Dg Chest 1 View  Result Date: 03/10/2018 CLINICAL DATA:  Altered mental status, lethargy and fatigue. EXAM: CHEST  1 VIEW COMPARISON:  None. FINDINGS: The heart size and mediastinal contours are within normal limits. There is no evidence of pulmonary edema, consolidation, pneumothorax, nodule or pleural fluid. The visualized skeletal structures are unremarkable. IMPRESSION: No active disease. Electronically Signed   By: Aletta Edouard M.D.   On: 03/10/2018 21:32   Ct Head Wo Contrast  Result Date: 03/10/2018 CLINICAL DATA:  Altered mental status, lethargy and fatigue. EXAM: CT HEAD WITHOUT CONTRAST TECHNIQUE: Contiguous axial images were obtained from the base of the skull through the vertex without intravenous contrast. COMPARISON:  11/11/2017 FINDINGS: Brain: Stable chronic small vessel ischemic disease in the  periventricular white matter and old lacunar infarcts in both basal ganglia. The brain demonstrates no evidence of hemorrhage, acute infarction, edema, mass effect, extra-axial fluid collection, hydrocephalus or mass lesion. Vascular: No hyperdense vessel or unexpected calcification. Skull: Normal. Negative for fracture or focal lesion. Sinuses/Orbits: No acute finding. Other: None. IMPRESSION: Stable chronic small vessel disease and bilateral old lacunar infarcts in the basal ganglia. No acute findings by head CT. Electronically Signed   By: Aletta Edouard M.D.   On: 03/10/2018 21:54   US Renal  Result Date: 03/10/2018 CLINICAL DATA:  Chronic kidney disease, UTI EXAM: RENAL / URINARY TRACT ULTRASOUND COMPLETE COMPARISON:  05/01/2017 FINDINGS: Right Kidney: Length: 10 cm. Slightly echogenic cortex. No hydronephrosis or mass Left Kidney: Length: 10 cm. Slightly echogenic cortex. No hydronephrosis or mass Bladder: Appears normal for degree of bladder distention. IMPRESSION: 1. Echogenic cortex bilaterally consistent with medical renal disease. No hydronephrosis Electronically Signed   By: Madie Reno.D.  On: 03/10/2018 23:38    (Echo, Carotid, EGD, Colonoscopy, ERCP)    Subjective:   Discharge Exam: Vitals:   03/12/18 2218 03/13/18 0446  BP: 139/76 140/68  Pulse: 77 88  Resp: 18 14  Temp: 98 F (36.7 C) 98.7 F (37.1 C)  SpO2: 100% 100%   Vitals:   03/12/18 0942 03/12/18 1354 03/12/18 2218 03/13/18 0446  BP: (!) 145/72 136/88 139/76 140/68  Pulse: 71 73 77 88  Resp:  18 18 14   Temp:  98 F (36.7 C) 98 F (36.7 C) 98.7 F (37.1 C)  TempSrc:  Oral Oral Oral  SpO2: 100% 97% 100% 100%  Weight:      Height:        General: Pt is alert, awake, not in acute distress Cardiovascular: RRR, S1/S2 +, no rubs, no gallops Respiratory: CTA bilaterally, no wheezing, no rhonchi Abdominal: Soft, NT, ND, bowel sounds + Extremities:trace edema  The results of significant diagnostics from  this hospitalization (including imaging, microbiology, ancillary and laboratory) are listed below for reference.     Microbiology: Recent Results (from the past 240 hour(s))  Culture, blood (routine x 2)     Status: None (Preliminary result)   Collection Time: 03/10/18  8:27 PM  Result Value Ref Range Status   Specimen Description   Final    BLOOD LEFT ANTECUBITAL Performed at [facility] [address] [place], [place] [address]    Special Requests   Final    BOTTLES DRAWN AEROBIC AND ANAEROBIC Blood Culture results may not be optimal due to an excessive volume of blood received in culture bottles Performed at [facility] [address] [place], [place] [address]    Culture   Final    NO GROWTH 2 DAYS Performed at [facility] [address], [place], [place] [address]    Report Status PENDING  Incomplete  Urine culture     Status: Abnormal   Collection Time: 03/10/18  9:38 PM  Result Value Ref Range Status   Specimen Description   Final    URINE, CLEAN CATCH Performed at [facility], [place] [address] [place], [place] [address]    Special Requests   Final    Normal Performed at [facility], [place] [address] [place], [place] [address]    Culture MULTIPLE SPECIES PRESENT, SUGGEST RECOLLECTION (A)  Final   Report Status 03/12/2018 FINAL  Final  MRSA PCR Screening     Status: Abnormal   Collection Time: 03/10/18 11:29 PM  Result Value Ref Range Status   MRSA by PCR POSITIVE (A) NEGATIVE Final    Comment:        The GeneXpert MRSA Assay (FDA approved for NASAL specimens only), is one component of a comprehensive MRSA colonization surveillance program. It is not intended to diagnose MRSA infection nor to guide or monitor treatment for MRSA infections. RESULT CALLED TO, READ BACK BY AND VERIFIED WITHJanee Morn RN AT 1448 03/11/18 BY TIBBITTS,K Performed at [facility], [place] [address] [place],  [address]   Culture, blood (routine x 2)     Status: None (Preliminary result)   Collection Time: 03/11/18  5:04 AM  Result Value Ref Range Status   Specimen Description   Final    BLOOD LEFT ANTECUBITAL Performed at [facility] [address], [place],  [address]    Special Requests   Final    BOTTLES DRAWN  AEROBIC ONLY Blood Culture adequate volume Performed at [facility] [address], [place], [place] [address]    Culture   Final    NO GROWTH < 24 HOURS Performed at [facility] [address], [place], [place] [address]    Report Status PENDING  Incomplete     Labs: BNP (last 3 results) No results for input(s): BNP in the last 8760 hours. Basic Metabolic Panel: Recent Labs  Lab 03/10/18 2027 03/10/18 2157 03/11/18 0504  03/11/18 2231 03/12/18 0228 03/12/18 0530 03/12/18 1419 03/13/18 0539  NA 140  --  142  --   --   --   --  141 145  K 5.4*  --  6.0*   < > 5.5* 5.2* 5.4* 5.2* 4.2  CL 102  --  106  --   --   --   --  102 104  CO2 26  --  24  --   --   --   --  23 26  GLUCOSE 107*  --  169*  --   --   --   --  180* 122*  BUN 53*  --  49*  --   --   --   --  58* 58*  CREATININE 4.28*  --  4.17*  --   --   --   --  4.04* 4.13*  CALCIUM 9.3  --  8.8*  --   --   --   --  9.4 9.1  MG  --  3.4*  --   --   --   --   --   --  2.7*   < > = values in this interval not displayed.   Liver Function Tests: Recent Labs  Lab 03/10/18 2027  AST 12*  ALT 12  ALKPHOS 87  BILITOT 0.4  PROT 8.4*  ALBUMIN 4.0   Recent Labs  Lab 03/10/18 2027  LIPASE 32   Recent Labs  Lab 03/10/18 2027  AMMONIA 27   CBC: Recent Labs  Lab 03/10/18 2027 03/11/18 0504 03/12/18 1419  WBC 9.0 11.1* 9.6  NEUTROABS 5.8  --   --   HGB 9.6* 9.3* 10.8*  HCT 30.1* 29.5* 33.3*  MCV 93.8 94.2 92.8  PLT 413* 369 399   Cardiac Enzymes: No results for input(s): CKTOTAL, CKMB, CKMBINDEX,  TROPONINI in the last 168 hours. BNP: Invalid input(s): POCBNP CBG: Recent Labs  Lab 03/12/18 1638 03/12/18 2141 03/12/18 2329 03/13/18 0444 03/13/18 0748  GLUCAP 140* 126* 128* 122* 99   D-Dimer No results for input(s): DDIMER in the last 72 hours. Hgb A1c Recent Labs    03/12/18 1419  HGBA1C 7.4*   Lipid Profile No results for input(s): CHOL, HDL, LDLCALC, TRIG, CHOLHDL, LDLDIRECT in the last 72 hours. Thyroid function studies Recent Labs    03/13/18 0539  TSH 4.781*   Anemia work up Recent Labs    03/13/18 0539  VITAMINB12 497   Urinalysis    Component Value Date/Time   COLORURINE STRAW (A) 03/10/2018 2027   APPEARANCEUR CLEAR 03/10/2018 2027   LABSPEC 1.004 (L) 03/10/2018 2027   PHURINE 8.0 03/10/2018 2027   GLUCOSEU NEGATIVE 03/10/2018 2027   HGBUR SMALL (A) 03/10/2018 2027   BILIRUBINUR NEGATIVE 03/10/2018 2027   BILIRUBINUR n 05/25/2012 [place] 03/10/2018 2027   PROTEINUR NEGATIVE 03/10/2018 2027   UROBILINOGEN 0.2 05/25/2012 1202   UROBILINOGEN 0.2 03/29/2011 0131   NITRITE NEGATIVE 03/10/2018  2027   LEUKOCYTESUR LARGE (A) 03/10/2018 2027   Sepsis Labs Invalid input(s): PROCALCITONIN,  WBC,  LACTICIDVEN Microbiology Recent Results (from the past 240 hour(s))  Culture, blood (routine x 2)     Status: None (Preliminary result)   Collection Time: 03/10/18  8:27 PM  Result Value Ref Range Status   Specimen Description   Final    BLOOD LEFT ANTECUBITAL Performed at [facility] [address], [place], [place] [address]    Special Requests   Final    BOTTLES DRAWN AEROBIC AND ANAEROBIC Blood Culture results may not be optimal due to an excessive volume of blood received in culture bottles Performed at [facility] [address] [place], [place] [address]    Culture   Final    NO GROWTH 2 DAYS Performed at [facility] [address] [place], [place] [address]    Report Status  PENDING  Incomplete  Urine culture     Status: Abnormal   Collection Time: 03/10/18  9:38 PM  Result Value Ref Range Status   Specimen Description   Final    URINE, CLEAN CATCH Performed at [facility], [place] [address] [place], [place] [address]    Special Requests   Final    Normal Performed at [facility], [place] [address], [place], [place] [address]    Culture MULTIPLE SPECIES PRESENT, SUGGEST RECOLLECTION (A)  Final   Report Status 03/12/2018 FINAL  Final  MRSA PCR Screening     Status: Abnormal   Collection Time: 03/10/18 11:29 PM  Result Value Ref Range Status   MRSA by PCR POSITIVE (A) NEGATIVE Final    Comment:        The GeneXpert MRSA Assay (FDA approved for NASAL specimens only), is one component of a comprehensive MRSA colonization surveillance program. It is not intended to diagnose MRSA infection nor to guide or monitor treatment for MRSA infections. RESULT CALLED TO, READ BACK BY AND VERIFIED WITHJanee Morn RN AT 6568 03/11/18 BY TIBBITTS,K Performed at [facility], [place] [address] [place], [place] [address]   Culture, blood (routine x 2)     Status: None (Preliminary result)   Collection Time: 03/11/18  5:04 AM  Result Value Ref Range Status   Specimen Description   Final    BLOOD LEFT ANTECUBITAL Performed at [facility] [address] [place], [place] [address]    Special Requests   Final    BOTTLES DRAWN AEROBIC ONLY Blood Culture adequate volume Performed at [facility] [address] [place], [place] [address]    Culture   Final    NO GROWTH < 24 HOURS Performed at [facility] [address], [name], [place] [address]    Report Status PENDING  Incomplete     Time coordinating discharge:35  minutes  SIGNED:   Georgette Shell, MD  [facility] 03/13/2018, 8:37 AM Pager   If 7PM-7AM, please contact  night-coverage [email or web address] Password [email or web address]

## 2018-03-13 NOTE — Plan of Care (Signed)
Patient being discharged back to ALF, AVS sent with patient via PTAR.

## 2018-03-15 LAB — CULTURE, BLOOD (ROUTINE X 2): CULTURE: NO GROWTH

## 2018-03-15 LAB — VITAMIN D 25 HYDROXY (VIT D DEFICIENCY, FRACTURES): Vit D, 25-Hydroxy: 31.9 ng/mL (ref 30.0–100.0)

## 2018-03-16 LAB — CULTURE, BLOOD (ROUTINE X 2)
CULTURE: NO GROWTH
SPECIAL REQUESTS: ADEQUATE

## 2018-06-09 ENCOUNTER — Other Ambulatory Visit: Payer: Self-pay

## 2018-06-09 ENCOUNTER — Emergency Department (HOSPITAL_COMMUNITY): Payer: Self-pay

## 2018-06-09 ENCOUNTER — Inpatient Hospital Stay (HOSPITAL_COMMUNITY)
Admission: EM | Admit: 2018-06-09 | Discharge: 2018-06-15 | DRG: 563 | Disposition: A | Discharge status: Skilled Nursing Facility | Payer: Self-pay | Attending: Infectious Disease | Admitting: Infectious Disease

## 2018-06-09 ENCOUNTER — Encounter (HOSPITAL_COMMUNITY): Payer: Self-pay | Admitting: *Deleted

## 2018-06-09 DIAGNOSIS — Z885 Allergy status to narcotic agent status: Secondary | ICD-10-CM

## 2018-06-09 DIAGNOSIS — E782 Mixed hyperlipidemia: Secondary | ICD-10-CM

## 2018-06-09 DIAGNOSIS — R55 Syncope and collapse: Secondary | ICD-10-CM

## 2018-06-09 DIAGNOSIS — E861 Hypovolemia: Secondary | ICD-10-CM | POA: Diagnosis present

## 2018-06-09 DIAGNOSIS — D638 Anemia in other chronic diseases classified elsewhere: Secondary | ICD-10-CM

## 2018-06-09 DIAGNOSIS — Y92009 Unspecified place in unspecified non-institutional (private) residence as the place of occurrence of the external cause: Secondary | ICD-10-CM

## 2018-06-09 DIAGNOSIS — D631 Anemia in chronic kidney disease: Secondary | ICD-10-CM | POA: Diagnosis present

## 2018-06-09 DIAGNOSIS — N179 Acute kidney failure, unspecified: Secondary | ICD-10-CM | POA: Diagnosis present

## 2018-06-09 DIAGNOSIS — I132 Hypertensive heart and chronic kidney disease with heart failure and with stage 5 chronic kidney disease, or end stage renal disease: Secondary | ICD-10-CM | POA: Diagnosis present

## 2018-06-09 DIAGNOSIS — F17210 Nicotine dependence, cigarettes, uncomplicated: Secondary | ICD-10-CM | POA: Diagnosis present

## 2018-06-09 DIAGNOSIS — Y92129 Unspecified place in nursing home as the place of occurrence of the external cause: Secondary | ICD-10-CM

## 2018-06-09 DIAGNOSIS — W1839XA Other fall on same level, initial encounter: Secondary | ICD-10-CM | POA: Diagnosis present

## 2018-06-09 DIAGNOSIS — N189 Chronic kidney disease, unspecified: Secondary | ICD-10-CM

## 2018-06-09 DIAGNOSIS — W19XXXA Unspecified fall, initial encounter: Secondary | ICD-10-CM | POA: Diagnosis present

## 2018-06-09 DIAGNOSIS — Z79899 Other long term (current) drug therapy: Secondary | ICD-10-CM

## 2018-06-09 DIAGNOSIS — E875 Hyperkalemia: Secondary | ICD-10-CM | POA: Diagnosis present

## 2018-06-09 DIAGNOSIS — E1165 Type 2 diabetes mellitus with hyperglycemia: Secondary | ICD-10-CM | POA: Diagnosis present

## 2018-06-09 DIAGNOSIS — K219 Gastro-esophageal reflux disease without esophagitis: Secondary | ICD-10-CM | POA: Diagnosis present

## 2018-06-09 DIAGNOSIS — W19XXXS Unspecified fall, sequela: Secondary | ICD-10-CM

## 2018-06-09 DIAGNOSIS — E1129 Type 2 diabetes mellitus with other diabetic kidney complication: Secondary | ICD-10-CM

## 2018-06-09 DIAGNOSIS — Z833 Family history of diabetes mellitus: Secondary | ICD-10-CM

## 2018-06-09 DIAGNOSIS — S52601A Unspecified fracture of lower end of right ulna, initial encounter for closed fracture: Secondary | ICD-10-CM | POA: Diagnosis present

## 2018-06-09 DIAGNOSIS — N185 Chronic kidney disease, stage 5: Secondary | ICD-10-CM | POA: Diagnosis present

## 2018-06-09 DIAGNOSIS — I639 Cerebral infarction, unspecified: Secondary | ICD-10-CM

## 2018-06-09 DIAGNOSIS — S62101A Fracture of unspecified carpal bone, right wrist, initial encounter for closed fracture: Secondary | ICD-10-CM

## 2018-06-09 DIAGNOSIS — Z7982 Long term (current) use of aspirin: Secondary | ICD-10-CM

## 2018-06-09 DIAGNOSIS — I5033 Acute on chronic diastolic (congestive) heart failure: Secondary | ICD-10-CM | POA: Diagnosis present

## 2018-06-09 DIAGNOSIS — E785 Hyperlipidemia, unspecified: Secondary | ICD-10-CM | POA: Diagnosis present

## 2018-06-09 DIAGNOSIS — Z794 Long term (current) use of insulin: Secondary | ICD-10-CM

## 2018-06-09 DIAGNOSIS — E08311 Diabetes mellitus due to underlying condition with unspecified diabetic retinopathy with macular edema: Secondary | ICD-10-CM

## 2018-06-09 DIAGNOSIS — I5032 Chronic diastolic (congestive) heart failure: Secondary | ICD-10-CM | POA: Diagnosis present

## 2018-06-09 DIAGNOSIS — E1122 Type 2 diabetes mellitus with diabetic chronic kidney disease: Secondary | ICD-10-CM | POA: Diagnosis present

## 2018-06-09 DIAGNOSIS — E11311 Type 2 diabetes mellitus with unspecified diabetic retinopathy with macular edema: Secondary | ICD-10-CM | POA: Diagnosis present

## 2018-06-09 DIAGNOSIS — I10 Essential (primary) hypertension: Secondary | ICD-10-CM | POA: Diagnosis present

## 2018-06-09 DIAGNOSIS — I951 Orthostatic hypotension: Secondary | ICD-10-CM | POA: Diagnosis present

## 2018-06-09 DIAGNOSIS — Z8673 Personal history of transient ischemic attack (TIA), and cerebral infarction without residual deficits: Secondary | ICD-10-CM

## 2018-06-09 DIAGNOSIS — S52501A Unspecified fracture of the lower end of right radius, initial encounter for closed fracture: Principal | ICD-10-CM | POA: Diagnosis present

## 2018-06-09 DIAGNOSIS — R0602 Shortness of breath: Secondary | ICD-10-CM

## 2018-06-09 LAB — CBC
HCT: 29.7 % — ABNORMAL LOW (ref 36.0–46.0)
Hemoglobin: 9 g/dL — ABNORMAL LOW (ref 12.0–15.0)
MCH: 29.6 pg (ref 26.0–34.0)
MCHC: 30.3 g/dL (ref 30.0–36.0)
MCV: 97.7 fL (ref 80.0–100.0)
Platelets: 254 10*3/uL (ref 150–400)
RBC: 3.04 MIL/uL — ABNORMAL LOW (ref 3.87–5.11)
RDW: 12.8 % (ref 11.5–15.5)
WBC: 8.4 10*3/uL (ref 4.0–10.5)
nRBC: 0 % (ref 0.0–0.2)

## 2018-06-09 LAB — BASIC METABOLIC PANEL
ANION GAP: 10 (ref 5–15)
BUN: 68 mg/dL — AB (ref 6–20)
CALCIUM: 8.7 mg/dL — AB (ref 8.9–10.3)
CO2: 25 mmol/L (ref 22–32)
CREATININE: 5.41 mg/dL — AB (ref 0.44–1.00)
Chloride: 105 mmol/L (ref 98–111)
GFR calc Af Amer: 9 mL/min — ABNORMAL LOW (ref >60)
GFR, EST NON AFRICAN AMERICAN: 8 mL/min — AB (ref >60)
GLUCOSE: 75 mg/dL (ref 70–99)
Potassium: 5.7 mmol/L — ABNORMAL HIGH (ref 3.5–5.1)
Sodium: 140 mmol/L (ref 135–145)

## 2018-06-09 LAB — GLUCOSE, CAPILLARY: Glucose-Capillary: 125 mg/dL — ABNORMAL HIGH (ref 70–99)

## 2018-06-09 LAB — CBC WITH DIFFERENTIAL/PLATELET
Abs Immature Granulocytes: 0.03 10*3/uL (ref 0.00–0.07)
Basophils Absolute: 0 10*3/uL (ref 0.0–0.1)
Basophils Relative: 0 %
Eosinophils Absolute: 0.1 10*3/uL (ref 0.0–0.5)
Eosinophils Relative: 2 %
HCT: 32.8 % — ABNORMAL LOW (ref 36.0–46.0)
Hemoglobin: 9.8 g/dL — ABNORMAL LOW (ref 12.0–15.0)
Immature Granulocytes: 0 %
Lymphocytes Relative: 19 %
Lymphs Abs: 1.4 10*3/uL (ref 0.7–4.0)
MCH: 29.6 pg (ref 26.0–34.0)
MCHC: 29.9 g/dL — ABNORMAL LOW (ref 30.0–36.0)
MCV: 99.1 fL (ref 80.0–100.0)
Monocytes Absolute: 0.6 10*3/uL (ref 0.1–1.0)
Monocytes Relative: 8 %
Neutro Abs: 5.2 10*3/uL (ref 1.7–7.7)
Neutrophils Relative %: 71 %
PLATELETS: 255 10*3/uL (ref 150–400)
RBC: 3.31 MIL/uL — ABNORMAL LOW (ref 3.87–5.11)
RDW: 12.8 % (ref 11.5–15.5)
WBC: 7.3 10*3/uL (ref 4.0–10.5)
nRBC: 0 % (ref 0.0–0.2)

## 2018-06-09 LAB — TROPONIN I

## 2018-06-09 LAB — URINALYSIS, ROUTINE W REFLEX MICROSCOPIC
BACTERIA UA: NONE SEEN
Bilirubin Urine: NEGATIVE
GLUCOSE, UA: NEGATIVE mg/dL
HGB URINE DIPSTICK: NEGATIVE
Ketones, ur: NEGATIVE mg/dL
LEUKOCYTES UA: NEGATIVE
Nitrite: NEGATIVE
PH: 7 (ref 5.0–8.0)
Protein, ur: 100 mg/dL — AB
SPECIFIC GRAVITY, URINE: 1.009 (ref 1.005–1.030)

## 2018-06-09 LAB — CREATININE, SERUM
Creatinine, Ser: 5.04 mg/dL — ABNORMAL HIGH (ref 0.44–1.00)
GFR calc Af Amer: 10 mL/min — ABNORMAL LOW (ref >60)
GFR calc non Af Amer: 9 mL/min — ABNORMAL LOW (ref >60)

## 2018-06-09 LAB — CBG MONITORING, ED: Glucose-Capillary: 136 mg/dL — ABNORMAL HIGH (ref 70–99)

## 2018-06-09 LAB — MAGNESIUM: Magnesium: 3.3 mg/dL — ABNORMAL HIGH (ref 1.7–2.4)

## 2018-06-09 LAB — TSH: TSH: 2.185 u[IU]/mL (ref 0.350–4.500)

## 2018-06-09 LAB — CK: Total CK: 135 U/L (ref 38–234)

## 2018-06-09 LAB — INFLUENZA PANEL BY PCR (TYPE A & B)
Influenza A By PCR: NEGATIVE
Influenza B By PCR: NEGATIVE

## 2018-06-09 LAB — PHOSPHORUS: Phosphorus: 4.7 mg/dL — ABNORMAL HIGH (ref 2.5–4.6)

## 2018-06-09 MED ORDER — SODIUM CHLORIDE 0.9 % IV SOLN
INTRAVENOUS | Status: AC
  Administered 2018-06-09: 18:00:00 via INTRAVENOUS

## 2018-06-09 MED ORDER — SODIUM CHLORIDE 0.9% FLUSH
3.0000 mL | Freq: Two times a day (BID) | INTRAVENOUS | Status: DC
  Administered 2018-06-09 – 2018-06-15 (×11): 3 mL via INTRAVENOUS

## 2018-06-09 MED ORDER — ONDANSETRON HCL 4 MG/2ML IJ SOLN: 4.0000 mg | Freq: Four times a day (QID) | INTRAMUSCULAR | Status: DC | PRN

## 2018-06-09 MED ORDER — DEXTROSE 50 % IV SOLN
1.0000 | Freq: Once | INTRAVENOUS | Status: AC
  Administered 2018-06-09: 50 mL via INTRAVENOUS
  Filled 2018-06-09: qty 50

## 2018-06-09 MED ORDER — SODIUM CHLORIDE 0.9 % IV BOLUS
1000.0000 mL | Freq: Once | INTRAVENOUS | Status: AC
  Administered 2018-06-09: 1000 mL via INTRAVENOUS

## 2018-06-09 MED ORDER — ONDANSETRON HCL 4 MG/2ML IJ SOLN
4.0000 mg | Freq: Once | INTRAMUSCULAR | Status: AC
  Administered 2018-06-09: 4 mg via INTRAVENOUS
  Filled 2018-06-09: qty 2

## 2018-06-09 MED ORDER — POLYETHYLENE GLYCOL 3350 17 G PO PACK
17.0000 g | PACK | Freq: Every day | ORAL | Status: DC
  Administered 2018-06-09 – 2018-06-15 (×5): 17 g via ORAL
  Filled 2018-06-09 (×7): qty 1

## 2018-06-09 MED ORDER — INSULIN GLARGINE 100 UNIT/ML ~~LOC~~ SOLN
24.0000 [IU] | Freq: Every day | SUBCUTANEOUS | Status: DC
  Administered 2018-06-10 – 2018-06-15 (×6): 24 [IU] via SUBCUTANEOUS
  Filled 2018-06-09 (×6): qty 0.24

## 2018-06-09 MED ORDER — ACETAMINOPHEN 650 MG RE SUPP: 650.0000 mg | Freq: Four times a day (QID) | RECTAL | Status: DC | PRN

## 2018-06-09 MED ORDER — INSULIN ASPART 100 UNIT/ML ~~LOC~~ SOLN
0.0000 [IU] | Freq: Every day | SUBCUTANEOUS | Status: DC
  Administered 2018-06-13 – 2018-06-14 (×2): 2 [IU] via SUBCUTANEOUS

## 2018-06-09 MED ORDER — INSULIN ASPART 100 UNIT/ML ~~LOC~~ SOLN
5.0000 [IU] | Freq: Once | SUBCUTANEOUS | Status: AC
  Administered 2018-06-09: 5 [IU] via INTRAVENOUS
  Filled 2018-06-09: qty 1

## 2018-06-09 MED ORDER — AMLODIPINE BESYLATE 10 MG PO TABS
10.0000 mg | ORAL_TABLET | Freq: Every day | ORAL | Status: DC
  Administered 2018-06-09 – 2018-06-15 (×7): 10 mg via ORAL
  Filled 2018-06-09 (×7): qty 1

## 2018-06-09 MED ORDER — INSULIN ASPART 100 UNIT/ML ~~LOC~~ SOLN
0.0000 [IU] | Freq: Three times a day (TID) | SUBCUTANEOUS | Status: DC
  Administered 2018-06-10: 2 [IU] via SUBCUTANEOUS
  Administered 2018-06-10 – 2018-06-12 (×3): 3 [IU] via SUBCUTANEOUS
  Administered 2018-06-13: 5 [IU] via SUBCUTANEOUS
  Administered 2018-06-13: 1 [IU] via SUBCUTANEOUS
  Administered 2018-06-14: 9 [IU] via SUBCUTANEOUS
  Administered 2018-06-15: 2 [IU] via SUBCUTANEOUS

## 2018-06-09 MED ORDER — HYDRALAZINE HCL 25 MG PO TABS
25.0000 mg | ORAL_TABLET | Freq: Three times a day (TID) | ORAL | Status: DC
  Administered 2018-06-09 – 2018-06-10 (×2): 25 mg via ORAL
  Filled 2018-06-09 (×2): qty 1

## 2018-06-09 MED ORDER — DIPHENHYDRAMINE HCL 25 MG PO CAPS: 25.0000 mg | ORAL_CAPSULE | ORAL | Status: DC | PRN

## 2018-06-09 MED ORDER — METOPROLOL TARTRATE 50 MG PO TABS
100.0000 mg | ORAL_TABLET | Freq: Two times a day (BID) | ORAL | Status: DC
  Administered 2018-06-09 – 2018-06-15 (×12): 100 mg via ORAL
  Filled 2018-06-09 (×12): qty 2

## 2018-06-09 MED ORDER — MAGNESIUM OXIDE 400 (241.3 MG) MG PO TABS
400.0000 mg | ORAL_TABLET | Freq: Two times a day (BID) | ORAL | Status: DC
  Administered 2018-06-09 – 2018-06-11 (×4): 400 mg via ORAL
  Filled 2018-06-09 (×4): qty 1

## 2018-06-09 MED ORDER — VITAMIN D (ERGOCALCIFEROL) 1.25 MG (50000 UNIT) PO CAPS: 50000.0000 [IU] | ORAL_CAPSULE | ORAL | Status: DC

## 2018-06-09 MED ORDER — ONDANSETRON HCL 4 MG PO TABS: 4.0000 mg | ORAL_TABLET | Freq: Four times a day (QID) | ORAL | Status: DC | PRN

## 2018-06-09 MED ORDER — GABAPENTIN 100 MG PO CAPS
100.0000 mg | ORAL_CAPSULE | Freq: Two times a day (BID) | ORAL | Status: DC
  Administered 2018-06-09 – 2018-06-15 (×12): 100 mg via ORAL
  Filled 2018-06-09 (×12): qty 1

## 2018-06-09 MED ORDER — FAMOTIDINE 20 MG PO TABS
20.0000 mg | ORAL_TABLET | Freq: Every day | ORAL | Status: DC
  Administered 2018-06-09 – 2018-06-15 (×7): 20 mg via ORAL
  Filled 2018-06-09 (×7): qty 1

## 2018-06-09 MED ORDER — DEXTROSE 50 % IV SOLN
INTRAVENOUS | Status: AC
  Filled 2018-06-09: qty 50

## 2018-06-09 MED ORDER — ATORVASTATIN CALCIUM 40 MG PO TABS
40.0000 mg | ORAL_TABLET | Freq: Every day | ORAL | Status: DC
  Administered 2018-06-09 – 2018-06-15 (×7): 40 mg via ORAL
  Filled 2018-06-09 (×7): qty 1

## 2018-06-09 MED ORDER — ASPIRIN EC 81 MG PO TBEC
81.0000 mg | DELAYED_RELEASE_TABLET | Freq: Every day | ORAL | Status: DC
  Administered 2018-06-09 – 2018-06-15 (×7): 81 mg via ORAL
  Filled 2018-06-09 (×7): qty 1

## 2018-06-09 MED ORDER — ADULT MULTIVITAMIN W/MINERALS CH
1.0000 | ORAL_TABLET | Freq: Every day | ORAL | Status: DC
  Administered 2018-06-09 – 2018-06-15 (×7): 1 via ORAL
  Filled 2018-06-09 (×7): qty 1

## 2018-06-09 MED ORDER — ACETAMINOPHEN 325 MG PO TABS
650.0000 mg | ORAL_TABLET | Freq: Four times a day (QID) | ORAL | Status: DC | PRN
  Administered 2018-06-10 – 2018-06-15 (×3): 650 mg via ORAL
  Filled 2018-06-09 (×3): qty 2

## 2018-06-09 MED ORDER — HEPARIN SODIUM (PORCINE) 5000 UNIT/ML IJ SOLN
5000.0000 [IU] | Freq: Three times a day (TID) | INTRAMUSCULAR | Status: DC
  Administered 2018-06-09 – 2018-06-15 (×17): 5000 [IU] via SUBCUTANEOUS
  Filled 2018-06-09 (×16): qty 1

## 2018-06-09 MED ORDER — SENNOSIDES-DOCUSATE SODIUM 8.6-50 MG PO TABS: 1.0000 | ORAL_TABLET | Freq: Every evening | ORAL | Status: DC | PRN

## 2018-06-09 MED ORDER — PANTOPRAZOLE SODIUM 40 MG PO TBEC
40.0000 mg | DELAYED_RELEASE_TABLET | Freq: Every day | ORAL | Status: DC
  Administered 2018-06-09 – 2018-06-15 (×7): 40 mg via ORAL
  Filled 2018-06-09 (×6): qty 1

## 2018-06-09 NOTE — ED Notes (Signed)
Unsuccessful IV attempt x1. Charge RN attempting at this time.

## 2018-06-09 NOTE — ED Notes (Signed)
Patient transported to X-ray 

## 2018-06-09 NOTE — ED Notes (Signed)
Pure wick in place to obtain urine specimen  

## 2018-06-09 NOTE — ED Notes (Signed)
Bed: WA20 Expected date:  Expected time:  Means of arrival:  Comments: EMS- 57yo, fall/wrist injury

## 2018-06-09 NOTE — ED Triage Notes (Signed)
Per EMS, Pt, from [facility], presents after a fall w/ R wrist injury.  Slight deformity noted.  Pt reports recent increase in falls.

## 2018-06-09 NOTE — ED Notes (Signed)
ED TO INPATIENT HANDOFF REPORT  Name/Age/Gender [name] Michael 57 y.o. female  Code Status Code Status History    Date Active Date Inactive Code Status Order ID Comments User Context   03/10/2018 2201 03/13/2018 1442 Full Code [id number]  Etta Quill, DO ED   11/09/2016 0359 11/21/2016 1705 Full Code [id number]  Lily Kocher, MD ED   10/23/2016 0004 10/24/2016 2257 Full Code [id number]  Etta Quill, DO ED   06/11/2016 2013 07/01/2016 2348 DNR [id number]  Velvet Bathe, MD Inpatient   01/28/2016 2113 01/29/2016 2038 Full Code [id number]  Iran Planas, MD Inpatient   02/28/2013 2213 03/01/2013 1719 Full Code [id number]  Larene Pickett, PA-C ED      Home/SNF/Other Skilled nursing facility  Chief Complaint right wrist pain   Level of Care/Admitting Diagnosis ED Disposition    ED Disposition Condition [facility] Area: [facility] [[id number]]  Level of Care: Telemetry [5]  Admit to tele based on following criteria: Monitor QTC interval  Admit to tele based on following criteria: Other see comments  Comments: Hyperkalemia  Diagnosis: Fall [290176]  Admitting Physician: Raiford Noble LATIF [[id number]]  Attending Physician: Raiford Noble LATIF [[id number]]  PT Class (Do Not Modify): Observation [104]  PT Acc Code (Do Not Modify): Observation [10022]       Medical History Past Medical History:  Diagnosis Date  . Diabetes mellitus   . Hyperlipidemia   . Hypertension   . 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 [name]   . TIA (transient ischemic attack) 03-12-11    Allergies Allergies  Allergen Reactions  . Hydrocodone Nausea And Vomiting    IV Location/Drains/Wounds Patient Lines/Drains/Airways Status   Active Line/Drains/Airways    Name:   Placement date:   Placement time:   Site:   Days:   Peripheral IV 06/09/18 Left;Medial Forearm   06/09/18    1451    Forearm   less than 1   External Urinary Catheter   03/11/18    0839    -   90   Incision (Closed) 01/28/16 Arm Left   01/28/16    1944     863          Labs/Imaging Results for orders placed or performed during the hospital encounter of 06/09/18 (from the past 48 hour(s))  Basic metabolic panel     Status: Abnormal   Collection Time: 06/09/18 10:36 AM  Result Value Ref Range   Sodium 140 135 - 145 mmol/L   Potassium 5.7 (H) 3.5 - 5.1 mmol/L   Chloride 105 98 - 111 mmol/L   CO2 25 22 - 32 mmol/L   Glucose, Bld 75 70 - 99 mg/dL   BUN 68 (H) 6 - 20 mg/dL   Creatinine, Ser 5.41 (H) 0.44 - 1.00 mg/dL   Calcium 8.7 (L) 8.9 - 10.3 mg/dL   GFR calc non Af Amer 8 (L) >60 mL/min   GFR calc Af Amer 9 (L) >60 mL/min   Anion gap 10 5 - 15    Comment: Performed at [facility], [place] [address] [place], [place] [address]  CBC with Differential/Platelet     Status: Abnormal   Collection Time: 06/09/18 12:39 PM  Result Value Ref Range   WBC 7.3 4.0 - 10.5 K/uL   RBC 3.31 (L) 3.87 - 5.11 MIL/uL   Hemoglobin 9.8 (L) 12.0 - 15.0 g/dL   HCT 32.8 (L) 36.0 - 46.0 %  MCV 99.1 80.0 - 100.0 fL   MCH 29.6 26.0 - 34.0 pg   MCHC 29.9 (L) 30.0 - 36.0 g/dL   RDW 12.8 11.5 - 15.5 %   Platelets 255 150 - 400 K/uL   nRBC 0.0 0.0 - 0.2 %   Neutrophils Relative % 71 %   Neutro Abs 5.2 1.7 - 7.7 K/uL   Lymphocytes Relative 19 %   Lymphs Abs 1.4 0.7 - 4.0 K/uL   Monocytes Relative 8 %   Monocytes Absolute 0.6 0.1 - 1.0 K/uL   Eosinophils Relative 2 %   Eosinophils Absolute 0.1 0.0 - 0.5 K/uL   Basophils Relative 0 %   Basophils Absolute 0.0 0.0 - 0.1 K/uL   Immature Granulocytes 0 %   Abs Immature Granulocytes 0.03 0.00 - 0.07 K/uL    Comment: Performed at [facility], [place] [address], [place], [place] [address]  Urinalysis, Routine w reflex microscopic     Status: Abnormal   Collection Time: 06/09/18  2:02 PM  Result Value Ref Range   Color, Urine YELLOW YELLOW   APPearance CLEAR CLEAR   Specific Gravity, Urine 1.009 1.005 - 1.030   pH  7.0 5.0 - 8.0   Glucose, UA NEGATIVE NEGATIVE mg/dL   Hgb urine dipstick NEGATIVE NEGATIVE   Bilirubin Urine NEGATIVE NEGATIVE   Ketones, ur NEGATIVE NEGATIVE mg/dL   Protein, ur 100 (A) NEGATIVE mg/dL   Nitrite NEGATIVE NEGATIVE   Leukocytes, UA NEGATIVE NEGATIVE   RBC / HPF 0-5 0 - 5 RBC/hpf   WBC, UA 0-5 0 - 5 WBC/hpf   Bacteria, UA NONE SEEN NONE SEEN    Comment: Performed at [facility], [place] [address], [place], [place] [address]  Influenza panel by PCR (type A & B)     Status: None   Collection Time: 06/09/18  2:03 PM  Result Value Ref Range   Influenza A By PCR NEGATIVE NEGATIVE   Influenza B By PCR NEGATIVE NEGATIVE    Comment: (NOTE) The Xpert Xpress Flu assay is intended as an aid in the diagnosis of  influenza and should not be used as a sole basis for treatment.  This  assay is FDA approved for nasopharyngeal swab specimens only. Nasal  washings and aspirates are unacceptable for Xpert Xpress Flu testing. Performed at [facility], [place] [address] [place], [place] [address]   CBG monitoring, ED     Status: Abnormal   Collection Time: 06/09/18  4:34 PM  Result Value Ref Range   Glucose-Capillary 136 (H) 70 - 99 mg/dL   Dg Chest 2 View  Result Date: 06/09/2018 CLINICAL DATA:  Cough and weakness EXAM: CHEST - 2 VIEW COMPARISON:  03/10/2018 FINDINGS: Cardiac shadow is enlarged but stable. The lungs are well aerated bilaterally. No focal infiltrate or sizable effusion is seen. No acute bony abnormality is noted. IMPRESSION: No acute abnormality noted. Electronically Signed   By: Inez Catalina M.D.   On: 06/09/2018 14:37   Dg Wrist Complete Right  Result Date: 06/09/2018 CLINICAL DATA:  Pain following fall EXAM: RIGHT WRIST - COMPLETE 3+ VIEW COMPARISON:  None. FINDINGS: Frontal, oblique, lateral, and ulnar deviation scaphoid images were obtained. There is a fracture at the distal radial metaphysis-diaphysis junction with impaction.  There is slight dorsal angulation distally. There is in addition a fracture of the distal ulnar metaphysis-diaphysis junction with overall alignment near anatomic. No other fractures are evident. No dislocations. 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 No erosive changes. Bones are osteoporotic. IMPRESSION: Fractures of the distal radius and ulna with impaction at the distal radial fracture site. Bones osteoporotic. No dislocation. 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 Electronically Signed   By: Lowella Grip III M.D.   On: 06/09/2018 09:57   Ct Head Wo Contrast  Result Date: 06/09/2018 CLINICAL DATA:  Increasing falls. EXAM: CT HEAD WITHOUT CONTRAST CT CERVICAL SPINE WITHOUT CONTRAST TECHNIQUE: Multidetector CT imaging of the head and cervical spine was performed following the standard protocol without intravenous contrast. Multiplanar CT image reconstructions of the cervical spine were also generated. COMPARISON:  03/10/2018 FINDINGS: CT HEAD FINDINGS Brain: Chronic small-vessel ischemic changes of the cerebral hemispheric white matter. Old infarction in the left basal ganglia and radiating white matter tracts. No sign of acute infarction, mass lesion, hemorrhage, hydrocephalus or extra-axial collection. Vascular: There is atherosclerotic calcification of the major vessels at the base of the brain. Skull: No skull fracture. Sinuses/Orbits: Clear/normal Other: None CT CERVICAL SPINE FINDINGS Alignment: No traumatic malalignment. Skull base and vertebrae: No fracture or focal bone lesion. Soft tissues and spinal canal: Normal Disc levels: No significant disc degeneration. No canal or foraminal stenosis. No significant facet arthropathy. Upper chest: Negative Other: Known right parotid mass only partially visualized. IMPRESSION: Head CT: No acute or traumatic finding. Chronic small-vessel ischemic changes. 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 Cervical spine CT:  Negative Electronically Signed   By: Nelson Chimes M.D.   On: 06/09/2018 16:13   Ct Cervical Spine Wo Contrast  Result Date: 06/09/2018 CLINICAL DATA:  Increasing falls. EXAM: CT HEAD WITHOUT CONTRAST CT CERVICAL SPINE WITHOUT CONTRAST TECHNIQUE: Multidetector CT imaging of the head and cervical spine was performed following the standard protocol without intravenous contrast. Multiplanar CT image reconstructions of the cervical spine were also generated. COMPARISON:  03/10/2018 FINDINGS: CT HEAD FINDINGS Brain: Chronic small-vessel ischemic changes of the cerebral hemispheric white matter. Old infarction in the left basal ganglia and radiating white matter tracts. No sign of acute infarction, mass lesion, hemorrhage, hydrocephalus or extra-axial collection. Vascular: There is atherosclerotic calcification of the major vessels at the base of the brain. Skull: No skull fracture. Sinuses/Orbits: Clear/normal Other: None CT CERVICAL SPINE FINDINGS Alignment: No traumatic malalignment. Skull base and vertebrae: No fracture or focal bone lesion. Soft tissues and spinal canal: Normal Disc levels: No significant disc degeneration. No canal or foraminal stenosis. No significant facet arthropathy. Upper chest: Negative Other: Known right parotid mass only partially visualized. IMPRESSION: Head CT: No acute or traumatic finding. Chronic small-vessel ischemic changes. 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 Cervical spine CT: Negative Electronically Signed   By: Nelson Chimes M.D.   On: 06/09/2018 16:13   EKG Interpretation  Date/Time:  Wednesday June 09 2018 10:47:41 EST Ventricular Rate:  77 PR Interval:    QRS Duration: 90 QT Interval:  401 QTC Calculation: 009 R Axis:   90 Text Interpretation:  Sinus rhythm Borderline right axis deviation No significant change since last tracing Confirmed by Gareth Morgan [phone]) on 06/09/2018 11:00:07 AM   Pending Labs Unresulted Labs (From admission, onward)    Start      Ordered   06/09/18 1600  CK  Once,   STAT     06/09/18 1559   06/09/18 1324  Urine culture  ONCE - STAT,   STAT     06/09/18 1323   06/09/18 1006  CBC with Differential/Platelet  Once,   R     06/09/18 1005  Signed and Held  CBC  (heparin)  Once,   R    Comments:  Baseline for heparin therapy IF NOT ALREADY DRAWN.  Notify MD if PLT < 100 K.    Signed and Held   Signed and Held  Creatinine, serum  (heparin)  Once,   R    Comments:  Baseline for heparin therapy IF NOT ALREADY DRAWN.    Signed and Held   Signed and Held  Magnesium  Add-on,   R     Signed and Held   Signed and Held  Phosphorus  Add-on,   R     Signed and Held   Signed and Held  Culture, sputum-assessment  Once,   R     Signed and Held   Signed and Held  Troponin I - Now Then Q6H  Now then every 6 hours,   R     Signed and Held   Visual merchandiser and Held  TSH  Once,   R     Signed and Held   Signed and Held  Hemoglobin A1c  Tomorrow morning,   R     Signed and Held   Signed and Held  Comprehensive metabolic panel  Tomorrow morning,   R     Signed and Held   Signed and Held  CBC  Tomorrow morning,   R     Signed and Held          Vitals/Pain Today's Vitals   06/09/18 1131 06/09/18 1336 06/09/18 1536 06/09/18 1735  BP: (!) 151/68 (!) 144/71 (!) 153/72 (!) 146/60  Pulse: 74 74 85 80  Resp: 13 13 13 19   Temp:      TempSrc:      SpO2: 90% 90% 100% 99%  PainSc:        Isolation Precautions Droplet precaution  Medications Medications  insulin aspart (novoLOG) injection 0-9 Units (has no administration in time range)  insulin aspart (novoLOG) injection 0-5 Units (has no administration in time range)  sodium chloride 0.9 % bolus 1,000 mL (1,000 mLs Intravenous New Bag/Given 06/09/18 1452)  insulin aspart (novoLOG) injection 5 Units (5 Units Intravenous Given 06/09/18 1454)  dextrose 50 % solution 50 mL (50 mLs Intravenous Given 06/09/18 1453)  ondansetron (ZOFRAN) injection 4 mg (4 mg Intravenous Given 06/09/18  1453)    Mobility non-ambulatory

## 2018-06-09 NOTE — ED Notes (Addendum)
Initial ampule of Dextrose broken. Second ampule pulled from pyxis for administration.

## 2018-06-09 NOTE — H&P (Signed)
History and Physical    [name] Michael GNF:[id number] DOB: [birth year] DOA: 06/09/2018  PCP: Charlott Rakes, MD   Patient coming from: ALF  Chief Complaint: Fall  HPI: [name] Michael is a 57 y.o. female with medical history significant of hypertension, hyperlipidemia, diabetes mellitus type 2, history of stroke and TIA, history of chronic diastolic CHF, CKD stage V, and multiple other comorbidities who presents from her ALF after a fall.  Is unclear how the patient fell and she does not know how and she remembers is woke up on the floor.  States that she blacked out and states that she was walking in the next minute she knows she is on the floor.  Immediately after she fell to the floor she fell on her wrist and with complaints of pain.  She denies any chest pain, lightheadedness or dizziness.  States that he does have some abdominal pain though.  No other concerns or complaints at this time.  Further work-up revealed that she has a broken wrist and AKI on CKD.  TRH was called to admit this patient for her acute fall and possible syncope episode in association with her AKI on CKD.  ED Course: Basic blood work done, as well as imaging studies of her wrist, chest, and a head and cervical spine CT done.  EDP called Dr. Grandville Silos with hand surgery and informed him of the patient's hospitalization and PA Orion Crook is to see the patient per EDP report  Review of Systems: As per HPI otherwise 10 point review of systems negative.   Past Medical History:  Diagnosis Date  . Diabetes mellitus   . Hyperlipidemia   . Hypertension   . Stroke [facility]) 04-01-11   left frontal subcortical, saw Dr. [name]   . TIA (transient ischemic attack) 03-12-11   Past Surgical History:  Procedure Laterality Date  . OPEN REDUCTION INTERNAL FIXATION (ORIF) DISTAL RADIAL FRACTURE Left 01/28/2016   Procedure: OPEN REDUCTION INTERNAL FIXATION (ORIF) DISTAL RADIAL FRACTURE;  Surgeon: Iran Planas, MD;  Location: [place];   Service: Orthopedics;  Laterality: Left;  . [place]     age 62   SOCIAL HISTORY  reports that she has been smoking cigarettes. She has a 32.00 pack-year smoking history. She has never used smokeless tobacco. She reports that she does not drink alcohol or use drugs.  Allergies  Allergen Reactions  . Hydrocodone Nausea And Vomiting   Family History  Problem Relation Age of Onset  . Stroke Mother   . Diabetes Mother   . Kidney failure Mother   . Heart failure Mother   . Stroke Father   . Cancer Sister        Breast- 42's   Prior to Admission medications   Medication Sig Start Date End Date Taking? Authorizing Provider  amLODipine (NORVASC) 10 MG tablet Take 10 mg by mouth daily.   Yes [provider]  aspirin 81 MG tablet Take 1 tablet (81 mg total) by mouth daily. 08/24/12  Yes Reyne Dumas, MD  atorvastatin (LIPITOR) 40 MG tablet Take 40 mg by mouth daily.   Yes [provider]  diphenhydrAMINE (BENADRYL) 25 MG tablet Take 25 mg by mouth every 4 (four) hours as needed for itching.   Yes [provider]  furosemide (LASIX) 20 MG tablet Take 20 mg by mouth daily.   Yes [provider]  gabapentin (NEURONTIN) 100 MG capsule Take  100 mg by mouth 2 (two) times daily.   Yes [provider]  hydrALAZINE (APRESOLINE) 25 MG tablet Take 1 tablet (25 mg total) by mouth 3 (three) times daily. 11/21/16  Yes Ghimire, Henreitta Leber, MD  insulin aspart (NOVOLOG) 100 UNIT/ML injection Inject 8 Units into the skin 3 (three) times daily with meals. 11/21/16  Yes Ghimire, Henreitta Leber, MD  insulin glargine (LANTUS) 100 UNIT/ML injection Inject 0.24 mLs (24 Units total) into the skin daily. Patient taking differently: Inject 40 Units into the skin daily.  11/22/16  Yes Ghimire, Henreitta Leber, MD  lidocaine (LIDODERM) 5 % Place 1 patch onto the skin daily. Remove & Discard patch within 12 hours or as directed  by MD 11/21/16  Yes Ghimire, Henreitta Leber, MD  magnesium oxide (MAG-OX) 400 MG tablet Take 400 mg by mouth 2 (two) times daily.   Yes [provider]  metoprolol (LOPRESSOR) 100 MG tablet Take 100 mg by mouth 2 (two) times daily.   Yes [provider]  Multiple Vitamins-Minerals (CERTAVITE SENIOR/ANTIOXIDANT) TABS Take 1 tablet by mouth daily.   Yes [provider]  omeprazole (PRILOSEC) 40 MG capsule Take 40 mg by mouth daily.   Yes [provider]  polyethylene glycol (MIRALAX / GLYCOLAX) packet Take 17 g by mouth daily. 11/22/16  Yes Ghimire, Henreitta Leber, MD  Probiotic Product (PROBIOTIC PO) Take 1 tablet by mouth 2 (two) times daily.    Yes [provider]  ranitidine (ZANTAC) 150 MG tablet Take 1 tablet (150 mg total) by mouth 2 (two) times daily. 10/14/16  Yes Street, [place], PA-C  Vitamin D, Ergocalciferol, (DRISDOL) 50000 units CAPS capsule Take 1 capsule (50,000 Units total) by mouth every 7 (seven) days. 11/21/16  Yes Ghimire, Henreitta Leber, MD  acetaminophen (TYLENOL) 325 MG tablet Take 2 tablets (650 mg total) by mouth every 6 (six) hours as needed for mild pain (or Fever >/= 101). Patient not taking: Reported on 06/09/2018 03/13/18   Georgette Shell, MD  calcium carbonate (TUMS - DOSED IN MG ELEMENTAL CALCIUM) 500 MG chewable tablet Chew 4 tablets (800 mg of elemental calcium total) by mouth 3 (three) times daily. Patient not taking: Reported on 06/09/2018 11/21/16   Jonetta Osgood, MD  cephALEXin (KEFLEX) 250 MG capsule Take 1 capsule (250 mg total) by mouth daily at 6 PM. Patient not taking: Reported on 06/09/2018 03/13/18   Georgette Shell, MD   Physical Exam: Vitals:   06/09/18 1131 06/09/18 1336 06/09/18 1536 06/09/18 1735  BP: (!) 151/68 (!) 144/71 (!) 153/72 (!) 146/60  Pulse: 74 74 85 80  Resp: 13 13 13 19   Temp:      TempSrc:      SpO2: 90% 90% 100% 99%   Constitutional: WN/WD Caucasian female in NAD and appears calm but  uncomfortable Eyes: Lids and conjunctivae normal, sclerae anicteric  ENMT: External Ears, Nose appear normal. Grossly normal hearing.  Neck: Appears normal, supple, no cervical masses, normal ROM, no appreciable thyromegaly; no JVD Respiratory: Diminished to auscultation bilaterally, no wheezing, rales, rhonchi or crackles. Normal respiratory effort and patient is not tachypenic. No accessory muscle use. However patient was wearing supplemental O2 via [place] for unclear reason. Cardiovascular: RRR, no murmurs / rubs / gallops. S1 and S2 auscultated. Trace extremity edema.  Abdomen: Soft, non-tender, Distended. Bowel sounds positive x4.  GU: Deferred. Musculoskeletal: Right arm and Wrist in a Splint Skin: No rashes, lesions, ulcers on a limited skin evaluation. No induration; Warm  and dry.  Neurologic: CN 2-12 grossly intact with no focal deficits. Romberg sign and cerebellar reflexes not assessed.  Psychiatric: Normal judgment and insight. Alert and awake. Normal mood and flat affect.   Labs on Admission: I have personally reviewed following labs and imaging studies  CBC: Recent Labs  Lab 06/09/18 1239  WBC 7.3  NEUTROABS 5.2  HGB 9.8*  HCT 32.8*  MCV 99.1  PLT 353   Basic Metabolic Panel: Recent Labs  Lab 06/09/18 1036  NA 140  K 5.7*  CL 105  CO2 25  GLUCOSE 75  BUN 68*  CREATININE 5.41*  CALCIUM 8.7*   GFR: CrCl cannot be calculated (Unknown ideal weight.). Liver Function Tests: No results for input(s): AST, ALT, ALKPHOS, BILITOT, PROT, ALBUMIN in the last 168 hours. No results for input(s): LIPASE, AMYLASE in the last 168 hours. No results for input(s): AMMONIA in the last 168 hours. Coagulation Profile: No results for input(s): INR, PROTIME in the last 168 hours. Cardiac Enzymes: No results for input(s): CKTOTAL, CKMB, CKMBINDEX, TROPONINI in the last 168 hours. BNP (last 3 results) No results for input(s): PROBNP in the last 8760 hours. HbA1C: No results for  input(s): HGBA1C in the last 72 hours. CBG: Recent Labs  Lab 06/09/18 1634  GLUCAP 136*   Lipid Profile: No results for input(s): CHOL, HDL, LDLCALC, TRIG, CHOLHDL, LDLDIRECT in the last 72 hours. Thyroid Function Tests: No results for input(s): TSH, T4TOTAL, FREET4, T3FREE, THYROIDAB in the last 72 hours. Anemia Panel: No results for input(s): VITAMINB12, FOLATE, FERRITIN, TIBC, IRON, RETICCTPCT in the last 72 hours. Urine analysis:    Component Value Date/Time   COLORURINE YELLOW 06/09/2018 1402   APPEARANCEUR CLEAR 06/09/2018 1402   LABSPEC 1.009 06/09/2018 1402   PHURINE 7.0 06/09/2018 1402   GLUCOSEU NEGATIVE 06/09/2018 1402   HGBUR NEGATIVE 06/09/2018 1402   BILIRUBINUR NEGATIVE 06/09/2018 1402   BILIRUBINUR n 05/25/2012 1202   KETONESUR NEGATIVE 06/09/2018 1402   PROTEINUR 100 (A) 06/09/2018 1402   UROBILINOGEN 0.2 05/25/2012 1202   UROBILINOGEN 0.2 03/29/2011 0131   NITRITE NEGATIVE 06/09/2018 1402   LEUKOCYTESUR NEGATIVE 06/09/2018 1402   Sepsis Labs: !!!!!!!!!!!!!!!!!!!!!!!!!!!!!!!!!!!!!!!!!!!! @LABRCNTIP (procalcitonin:4,lacticidven:4) )No results found for this or any previous visit (from the past 240 hour(s)).   Radiological Exams on Admission: Dg Chest 2 View  Result Date: 06/09/2018 CLINICAL DATA:  Cough and weakness EXAM: CHEST - 2 VIEW COMPARISON:  03/10/2018 FINDINGS: Cardiac shadow is enlarged but stable. The lungs are well aerated bilaterally. No focal infiltrate or sizable effusion is seen. No acute bony abnormality is noted. IMPRESSION: No acute abnormality noted. Electronically Signed   By: Inez Catalina M.D.   On: 06/09/2018 14:37   Dg Wrist Complete Right  Result Date: 06/09/2018 CLINICAL DATA:  Pain following fall EXAM: RIGHT WRIST - COMPLETE 3+ VIEW COMPARISON:  None. FINDINGS: Frontal, oblique, lateral, and ulnar deviation scaphoid images were obtained. There is a fracture at the distal radial metaphysis-diaphysis junction with impaction. There is  slight dorsal angulation distally. There is in addition a fracture of the distal ulnar metaphysis-diaphysis junction with overall alignment near anatomic. No other fractures are evident. No dislocations. 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 No erosive changes. Bones are osteoporotic. IMPRESSION: Fractures of the distal radius and ulna with impaction at the distal radial fracture site. Bones osteoporotic. No dislocation. 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 Electronically Signed   By: Lowella Grip III M.D.   On: 06/09/2018 09:57   Ct  Head Wo Contrast  Result Date: 06/09/2018 CLINICAL DATA:  Increasing falls. EXAM: CT HEAD WITHOUT CONTRAST CT CERVICAL SPINE WITHOUT CONTRAST TECHNIQUE: Multidetector CT imaging of the head and cervical spine was performed following the standard protocol without intravenous contrast. Multiplanar CT image reconstructions of the cervical spine were also generated. COMPARISON:  03/10/2018 FINDINGS: CT HEAD FINDINGS Brain: Chronic small-vessel ischemic changes of the cerebral hemispheric white matter. Old infarction in the left basal ganglia and radiating white matter tracts. No sign of acute infarction, mass lesion, hemorrhage, hydrocephalus or extra-axial collection. Vascular: There is atherosclerotic calcification of the major vessels at the base of the brain. Skull: No skull fracture. Sinuses/Orbits: Clear/normal Other: None CT CERVICAL SPINE FINDINGS Alignment: No traumatic malalignment. Skull base and vertebrae: No fracture or focal bone lesion. Soft tissues and spinal canal: Normal Disc levels: No significant disc degeneration. No canal or foraminal stenosis. No significant facet arthropathy. Upper chest: Negative Other: Known right parotid mass only partially visualized. IMPRESSION: Head CT: No acute or traumatic finding. Chronic small-vessel ischemic changes. 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 Cervical spine CT: Negative  Electronically Signed   By: Nelson Chimes M.D.   On: 06/09/2018 16:13   Ct Cervical Spine Wo Contrast  Result Date: 06/09/2018 CLINICAL DATA:  Increasing falls. EXAM: CT HEAD WITHOUT CONTRAST CT CERVICAL SPINE WITHOUT CONTRAST TECHNIQUE: Multidetector CT imaging of the head and cervical spine was performed following the standard protocol without intravenous contrast. Multiplanar CT image reconstructions of the cervical spine were also generated. COMPARISON:  03/10/2018 FINDINGS: CT HEAD FINDINGS Brain: Chronic small-vessel ischemic changes of the cerebral hemispheric white matter. Old infarction in the left basal ganglia and radiating white matter tracts. No sign of acute infarction, mass lesion, hemorrhage, hydrocephalus or extra-axial collection. Vascular: There is atherosclerotic calcification of the major vessels at the base of the brain. Skull: No skull fracture. Sinuses/Orbits: Clear/normal Other: None CT CERVICAL SPINE FINDINGS Alignment: No traumatic malalignment. Skull base and vertebrae: No fracture or focal bone lesion. Soft tissues and spinal canal: Normal Disc levels: No significant disc degeneration. No canal or foraminal stenosis. No significant facet arthropathy. Upper chest: Negative Other: Known right parotid mass only partially visualized. IMPRESSION: Head CT: No acute or traumatic finding. Chronic small-vessel ischemic changes. 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 Cervical spine CT: Negative Electronically Signed   By: Nelson Chimes M.D.   On: 06/09/2018 16:13   EKG: Independently reviewed. Showed Sinus Rhythm at a rate of 77 and no evidence of ST elevation or Depression on my interpretation.   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 Grandville Silos may evaluate -CK level to rule out rhabdo -Check orthostatic vital signs -PT and OT to evaluate and treat -UA unremarkable and Urine Cx sent by EDP  ? Syncope -Patient fell and does  not know how -CT Head showed no acute traumatic finding and there is chronic small vessel ischemic changes and old basal ganglia infarction.  Cervical spine CT was negative -Check echocardiogram -Cycle cardiac troponins -Check orthostatic vital signs -We will need PT and OT to evaluate -TED hose -We will hold Lasix at this time -Gentle IV fluid hydration with normal saline rate to 75 mL's per hour for 10 hours  AKI on CKD Stage 5 -Patient's BUN/Cr worsened and went to 68/5.41 -Baseline Cr is around 4 -Avoid Nephrotoxic Medications -Continue to Monitor and Trend Renal Fxn -Given a 1 L bolus in the ED -Gentle IVF Hydration with NS at 75 mL/hr x 10 hours -May need a Nephrology Consultation if worsening or not improving  Hyperkalemia -K+ was 5.7 -In the Setting of worsening Renal Fxn -Was given insulin 5 units and D50 -Also given a liter bolus of normal saline in the ED -Continue monitor and repeat CMP in a.m. -Continue with Telemetry  Diabetes mellitus type 2 -Recent hemoglobin A1c was 7.43 months ago -Repeat hemoglobin A1c here -Placed on* Scale insulin -Continue with Lantus at a reduced dose 24 units subcu daily  Hyperlipidemia -Continue with Atorvastatin 4 mg p.o. nightly  Hypertension -Continue with amlodipine 10 mg  p.o. daily, hydralazine 25 mg p.o. 3 times daily, metoprolol 100 g p.o. twice daily -We will hold p.o. Lasix 20 mg p.o. daily  History of TIA and Stroke -Continue with atorvastatin 40 mg p.o. nightly along with aspirin 81 mg p.o. daily -CT showed an old basal ganglia infarction  GERD -Continue with omeprazole substitution with pantoprazole 40 mg p.o. daily and Ranitidine substitution with famotidine 20 mg p.o. twice daily  Anemia of Chronic Kidney Disease -Patient's hemoglobin/hematocrit admission was 9.8/32.8 -May be also hemo-concentrated  -Continue IV fluid hydration as above -Continue monitor for signs of central bleeding -Pete CBC in a.m.  Chronic Diastolic CHF -Currently not Decompensated -C/w Home Medications as above -Gentle IVF Hydration and hold Home Lasix for now -Continue to Monitor Volume Status Carefully  DVT prophylaxis: Heparin 5,000 units sq q8h Code Status: FULL CODE Family Communication: No family present at bedside Disposition Plan: Pending PT evaluation but suspect back to ALF if improved and Kidney Fxn back to baseline Consults called: None Admission status: Obs Telemetry  Severity of Illness: The appropriate patient status for this patient is OBSERVATION. Observation status is judged to be reasonable and necessary in order to provide the required intensity of service to ensure the patient's safety. The patient's presenting symptoms, physical exam findings, and initial radiographic and laboratory data in the context of their medical condition is felt to place them at decreased risk for further clinical deterioration. Furthermore, it is anticipated that the patient will be medically stable for discharge from the hospital within 2 midnights of admission. The following factors support the patient status of observation.   " The patient's presenting symptoms include Fall of unclear etiology. " The physical exam findings include Right wrist in a splinit. " The  initial radiographic data show a broken wrist and Head CT Negative but Lab data shows AKI on CKD and Hyperkalemia.  Kerney Elbe, D.O. [facility] PAGER is on [place]  If 7PM-7AM, please contact night-coverage [email or web address] Password [email or web address]  06/09/2018, 5:40 PM

## 2018-06-09 NOTE — ED Provider Notes (Signed)
[facility] DEPT Provider Note   CSN: [id number] Arrival date & time: 06/09/18  0906     History   Chief Complaint Chief Complaint  Patient presents with  . Fall  . Wrist Injury    HPI [name] Michael is a 57 y.o. female.  Patient presents from [facility], history of diabetes with history of retinopathy, CVA, chronic kidney disease --presents with complaint of wrist pain after a fall.  Patient states that she was walking and "I just fell".  She is unable to give me details regarding why or how she fell.  She is uncertain if she may have passed out or not.  Currently complains of pain in right wrist.  No elbow or shoulder pain.  No numbness or tingling.  Denies any headache or neck pain. The onset of this condition was acute. The course is constant. Aggravating factors: movement. Alleviating factors: none.       Past Medical History:  Diagnosis Date  . Diabetes mellitus   . Hyperlipidemia   . Hypertension   . Stroke [facility]) 04-01-11   left frontal subcortical, saw Dr. [name]   . TIA (transient ischemic attack) 03-12-11    Patient Active Problem List   Diagnosis Date Noted  . CKD (chronic kidney disease) stage 5, GFR less than 15 ml/min (HCC) 03/10/2018  . Acute encephalopathy 03/10/2018  . Hypermagnesemia 03/10/2018  . AKI (acute kidney injury) ([place]) 11/09/2016  . Hypercalcemia 11/09/2016  . Hyponatremia 11/09/2016  . Hypovolemia 11/09/2016  . Accelerated hypertension 11/09/2016  . Acute lower UTI 11/09/2016  . Nephrolithiasis 10/24/2016  . Constipation 10/22/2016  . CAP (community acquired pneumonia) 10/22/2016  . Hydronephrosis of right kidney 10/22/2016  . Metabolic acidosis 53/61/4431  . CVA (cerebral vascular accident) ([place]) 07/03/2016  . HAP (hospital-acquired pneumonia) 07/03/2016  . Sepsis secondary to UTI ([place])   . UTI due to extended-spectrum beta lactamase (ESBL) producing Escherichia coli   . Acute  pyelonephritis   . Bacteremia due to Escherichia coli   . Colitis, indeterminate   . Uncontrolled type 2 diabetes mellitus with complication ([place])   . 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 associated with diabetes mellitus due to underlying condition ([place])   . Chronic diastolic CHF (congestive heart failure) ([place])   . Abdominal pain   . Colitis   . Hypophosphatemia 06/12/2016  . Hypokalemia 06/11/2016  . Hypocalcemia 06/11/2016  . Acute kidney injury superimposed on chronic kidney disease ([place]) 06/11/2016  . Proliferative diabetic retinopathy ([place]) 04/29/2016  . Closed fracture of left distal radius 01/28/2016  . Poor social situation 03/09/2013  . Depression 03/01/2013  . Abnormal mammogram 12/20/2012  . Retinal detachment 11/17/2012  . 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 ([place]) 54/00/8676  . Hyperlipidemia 02/09/2007  . Essential hypertension 02/09/2007  . GERD 02/09/2007    Past Surgical History:  Procedure Laterality Date  . OPEN REDUCTION INTERNAL FIXATION (ORIF) DISTAL RADIAL FRACTURE Left 01/28/2016   Procedure: OPEN REDUCTION INTERNAL FIXATION (ORIF) DISTAL RADIAL FRACTURE;  Surgeon: Iran Planas, MD;  Location: [facility];  Service: Orthopedics;  Laterality: Left;  . [place]     age 33     OB History   No obstetric history on file.      Home Medications    Prior to Admission medications   Medication Sig Start Date End Date Taking? Authorizing Provider  acetaminophen (TYLENOL)  325 MG tablet Take 2 tablets (650 mg total) by mouth every 6 (six) hours as needed for mild pain (or Fever >/= 101). 03/13/18   Georgette Shell, MD  aspirin 81 MG tablet Take 1 tablet (81 mg total) by mouth daily. 08/24/12   Reyne Dumas, MD  atorvastatin (LIPITOR) 40 MG tablet Take 40 mg by mouth daily.    [provider]  calcium carbonate (TUMS - DOSED IN MG ELEMENTAL  CALCIUM) 500 MG chewable tablet Chew 4 tablets (800 mg of elemental calcium total) by mouth 3 (three) times daily. Patient taking differently: Chew 800 mg of elemental calcium by mouth 3 (three) times daily as needed for indigestion or heartburn.  11/21/16   Ghimire, Henreitta Leber, MD  cephALEXin (KEFLEX) 250 MG capsule Take 1 capsule (250 mg total) by mouth daily at 6 PM. 03/13/18   Georgette Shell, MD  diphenhydrAMINE (BENADRYL) 25 MG tablet Take 25 mg by mouth every 4 (four) hours as needed for itching.    [provider]  furosemide (LASIX) 20 MG tablet Take 20 mg by mouth daily.    [provider]  gabapentin (NEURONTIN) 100 MG capsule Take 100 mg by mouth 2 (two) times daily.    [provider]  hydrALAZINE (APRESOLINE) 25 MG tablet Take 1 tablet (25 mg total) by mouth 3 (three) times daily. 11/21/16   Ghimire, Henreitta Leber, MD  insulin aspart (NOVOLOG) 100 UNIT/ML injection Inject 8 Units into the skin 3 (three) times daily with meals. 11/21/16   Ghimire, Henreitta Leber, MD  insulin glargine (LANTUS) 100 UNIT/ML injection Inject 0.24 mLs (24 Units total) into the skin daily. Patient taking differently: Inject 40 Units into the skin daily.  11/22/16   Ghimire, Henreitta Leber, MD  lidocaine (LIDODERM) 5 % Place 1 patch onto the skin daily. Remove & Discard patch within 12 hours or as directed by MD 11/21/16   Jonetta Osgood, MD  metoprolol (LOPRESSOR) 100 MG tablet Take 100 mg by mouth 2 (two) times daily.    [provider]  Multiple Vitamins-Minerals (CERTAVITE SENIOR/ANTIOXIDANT) TABS Take 1 tablet by mouth daily.    [provider]  omeprazole (PRILOSEC) 40 MG capsule Take 40 mg by mouth daily.    [provider]  polyethylene glycol (MIRALAX / GLYCOLAX) packet Take 17 g by mouth daily. 11/22/16   Ghimire, Henreitta Leber, MD  Probiotic Product (PROBIOTIC PO) Take 1 tablet by mouth daily.    [provider]  ranitidine (ZANTAC) 150 MG tablet Take 1 tablet  (150 mg total) by mouth 2 (two) times daily. 10/14/16   Street, Mercedes, PA-C  sodium bicarbonate 650 MG tablet Take 1 tablet (650 mg total) by mouth 2 (two) times daily. 06/30/16   Patrecia Pour, MD  Vitamin D, Ergocalciferol, (DRISDOL) 50000 units CAPS capsule Take 1 capsule (50,000 Units total) by mouth every 7 (seven) days. 11/21/16   Ghimire, Henreitta Leber, MD    Family History Family History  Problem Relation Age of Onset  . Stroke Mother   . Diabetes Mother   . Kidney failure Mother   . Heart failure Mother   . Stroke Father   . Cancer Sister        Breast- 48's    Social History Social History   Tobacco Use  . Smoking status: Current Every Day Smoker    Packs/day: 1.00    Years: 32.00    Pack years: 32.00    Types: Cigarettes  Last attempt to quit: 09/23/2012    Years since quitting: 5.7  . Smokeless tobacco: Never Used  . Tobacco comment: or less  Substance Use Topics  . Alcohol use: No  . Drug use: No     Allergies   Hydrocodone   Review of Systems Review of Systems  Constitutional: Negative for activity change.  HENT: Negative for congestion.   Eyes: Negative for pain.  Respiratory: Negative for shortness of breath.   Cardiovascular: Negative for chest pain.  Gastrointestinal: Negative for abdominal pain, nausea and vomiting.  Genitourinary: Negative for dysuria.  Musculoskeletal: Positive for arthralgias and joint swelling. Negative for back pain and neck pain.  Skin: Negative for wound.  Neurological: Positive for weakness (generalized). Negative for numbness.     Physical Exam Updated Vital Signs BP (!) 160/104   Pulse 78   Temp 99.1 F (37.3 C) (Oral)   Resp 18   LMP 03/06/2013   SpO2 93%   Physical Exam Vitals signs and nursing note reviewed.  Constitutional:      Appearance: She is well-developed.  HENT:     Head: Normocephalic and atraumatic.     Comments: No visible signs of trauma.  Eyes:     Pupils: Pupils are equal, round, and  reactive to light.  Neck:     Musculoskeletal: Normal range of motion and neck supple.  Cardiovascular:     Rate and Rhythm: Normal rate.     Pulses: Normal pulses. No decreased pulses.          Radial pulses are 2+ on the right side and 2+ on the left side.     Comments: Cap refill less than 2 seconds in fingers of right hand. Pulmonary:     Effort: Pulmonary effort is normal.     Breath sounds: No wheezing, rhonchi or rales.  Abdominal:     Tenderness: There is no abdominal tenderness. There is no guarding.  Musculoskeletal:        General: Tenderness present.     Right shoulder: Normal.     Left shoulder: Normal.     Right elbow: Normal.    Right wrist: She exhibits decreased range of motion, tenderness, bony tenderness and deformity.     Cervical back: Normal.     Thoracic back: Normal.     Right hand: Normal.  Skin:    General: Skin is warm and dry.  Neurological:     Mental Status: She is alert.     Sensory: No sensory deficit.     Comments: Motor, sensation, and vascular distal to the injury is fully intact.       ED Treatments / Results  Labs (all labs ordered are listed, but only abnormal results are displayed) Labs Reviewed  BASIC METABOLIC PANEL - Abnormal; Notable for the following components:      Result Value   Potassium 5.7 (*)    BUN 68 (*)    Creatinine, Ser 5.41 (*)    Calcium 8.7 (*)    GFR calc non Af Amer 8 (*)    GFR calc Af Amer 9 (*)    All other components within normal limits  URINALYSIS, ROUTINE W REFLEX MICROSCOPIC - Abnormal; Notable for the following components:   Protein, ur 100 (*)    All other components within normal limits  CBC WITH DIFFERENTIAL/PLATELET - Abnormal; Notable for the following components:   RBC 3.31 (*)    Hemoglobin 9.8 (*)    HCT 32.8 (*)  MCHC 29.9 (*)    All other components within normal limits  URINE CULTURE  INFLUENZA PANEL BY PCR (TYPE A & B)  CBC WITH DIFFERENTIAL/PLATELET  CK    EKG EKG  Interpretation  Date/Time:  Wednesday June 09 2018 10:47:41 EST Ventricular Rate:  77 PR Interval:    QRS Duration: 90 QT Interval:  401 QTC Calculation: 606 R Axis:   90 Text Interpretation:  Sinus rhythm Borderline right axis deviation No significant change since last tracing Confirmed by Gareth Morgan [phone]) on 06/09/2018 11:00:07 AM   Radiology Dg Wrist Complete Right  Result Date: 06/09/2018 CLINICAL DATA:  Pain following fall EXAM: RIGHT WRIST - COMPLETE 3+ VIEW COMPARISON:  None. FINDINGS: Frontal, oblique, lateral, and ulnar deviation scaphoid images were obtained. There is a fracture at the distal radial metaphysis-diaphysis junction with impaction. There is slight dorsal angulation distally. There is in addition a fracture of the distal ulnar metaphysis-diaphysis junction with overall alignment near anatomic. No other fractures are evident. No dislocations. 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 No erosive changes. Bones are osteoporotic. IMPRESSION: Fractures of the distal radius and ulna with impaction at the distal radial fracture site. Bones osteoporotic. No dislocation. 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 Electronically Signed   By: Lowella Grip III M.D.   On: 06/09/2018 09:57    Procedures Procedures (including critical care time)  Medications Ordered in ED Medications  sodium chloride 0.9 % bolus 1,000 mL (1,000 mLs Intravenous New Bag/Given 06/09/18 1452)  insulin aspart (novoLOG) injection 5 Units (5 Units Intravenous Given 06/09/18 1454)  dextrose 50 % solution 50 mL (50 mLs Intravenous Given 06/09/18 1453)  ondansetron (ZOFRAN) injection 4 mg (4 mg Intravenous Given 06/09/18 1453)     Initial Impression / Assessment and Plan / ED Course  I have reviewed the triage vital signs and the nursing notes.  Pertinent labs & imaging results that were available during my care of the patient were reviewed by me and  considered in my medical decision making (see chart for details).     Patient seen and examined. Work-up initiated.  Medical work-up initiated given patient being a poor historian and unable to tell me details regarding her fall.  Vital signs reviewed and are as follows: BP (!) 160/104   Pulse 78   Temp 99.1 F (37.3 C) (Oral)   Resp 18   LMP 03/06/2013   SpO2 93%   X-ray reviewed.  Distal ulnar and radial fractures noted.  They are reasonably well approximated and patient does not require reduction at this point.  I spoke with Hilbert Odor, PA-C.  Recommends sugar tong splint.  This was ordered and placed.  Patient with acute on chronic kidney injury with hyperkalemia.  Patient discussed with and seen by Dr. Billy Fischer.  Insulin/D50 ordered to help moderate potassium.  EKG without any acute findings of hyperkalemia.  Patient was given 1 L bolus of normal saline.  Urine and chest x-ray are unremarkable.  Head and neck CT are pending.  4:08 PM Discussed case with Dr. Alfredia Ferguson who will see patient.   Final Clinical Impressions(s) / ED Diagnoses   Final diagnoses:  Acute renal failure superimposed on stage 5 chronic kidney disease, not on chronic dialysis, unspecified acute renal failure type ([place])  Closed fracture of right wrist, initial encounter  Hyperkalemia   Admit.    ED Discharge Orders    None       Carlisle Cater, [place] 06/09/18 1609  Gareth Morgan, MD 06/10/18 1115

## 2018-06-10 ENCOUNTER — Observation Stay (HOSPITAL_COMMUNITY): Payer: Self-pay

## 2018-06-10 ENCOUNTER — Observation Stay (HOSPITAL_BASED_OUTPATIENT_CLINIC_OR_DEPARTMENT_OTHER): Payer: Self-pay

## 2018-06-10 DIAGNOSIS — I361 Nonrheumatic tricuspid (valve) insufficiency: Secondary | ICD-10-CM

## 2018-06-10 DIAGNOSIS — I340 Nonrheumatic mitral (valve) insufficiency: Secondary | ICD-10-CM

## 2018-06-10 LAB — CBC
HCT: 29.6 % — ABNORMAL LOW (ref 36.0–46.0)
Hemoglobin: 8.8 g/dL — ABNORMAL LOW (ref 12.0–15.0)
MCH: 29 pg (ref 26.0–34.0)
MCHC: 29.7 g/dL — ABNORMAL LOW (ref 30.0–36.0)
MCV: 97.7 fL (ref 80.0–100.0)
PLATELETS: 233 10*3/uL (ref 150–400)
RBC: 3.03 MIL/uL — ABNORMAL LOW (ref 3.87–5.11)
RDW: 12.6 % (ref 11.5–15.5)
WBC: 7.5 10*3/uL (ref 4.0–10.5)
nRBC: 0 % (ref 0.0–0.2)

## 2018-06-10 LAB — URINE CULTURE: CULTURE: NO GROWTH

## 2018-06-10 LAB — COMPREHENSIVE METABOLIC PANEL
ALK PHOS: 71 U/L (ref 38–126)
ALT: 15 U/L (ref 0–44)
AST: 16 U/L (ref 15–41)
Albumin: 3.6 g/dL (ref 3.5–5.0)
Anion gap: 10 (ref 5–15)
BUN: 60 mg/dL — ABNORMAL HIGH (ref 6–20)
CALCIUM: 8.2 mg/dL — AB (ref 8.9–10.3)
CO2: 21 mmol/L — ABNORMAL LOW (ref 22–32)
Chloride: 111 mmol/L (ref 98–111)
Creatinine, Ser: 4.43 mg/dL — ABNORMAL HIGH (ref 0.44–1.00)
GFR calc Af Amer: 12 mL/min — ABNORMAL LOW (ref >60)
GFR calc non Af Amer: 10 mL/min — ABNORMAL LOW (ref >60)
Glucose, Bld: 73 mg/dL (ref 70–99)
Potassium: 4.8 mmol/L (ref 3.5–5.1)
Sodium: 142 mmol/L (ref 135–145)
Total Bilirubin: 0.7 mg/dL (ref 0.3–1.2)
Total Protein: 7.1 g/dL (ref 6.5–8.1)

## 2018-06-10 LAB — GLUCOSE, CAPILLARY
Glucose-Capillary: 59 mg/dL — ABNORMAL LOW (ref 70–99)
Glucose-Capillary: 76 mg/dL (ref 70–99)
Glucose-Capillary: 175 mg/dL — ABNORMAL HIGH (ref 70–99)
Glucose-Capillary: 201 mg/dL — ABNORMAL HIGH (ref 70–99)
Glucose-Capillary: 132 mg/dL — ABNORMAL HIGH (ref 70–99)

## 2018-06-10 LAB — ECHOCARDIOGRAM COMPLETE
Height: 55 in
Weight: 1652.8 oz

## 2018-06-10 LAB — TROPONIN I
Troponin I: <0.03 ng/mL (ref <0.03)
Troponin I: <0.03 ng/mL (ref <0.03)

## 2018-06-10 LAB — HEMOGLOBIN A1C
Hgb A1c MFr Bld: 6.4 % — ABNORMAL HIGH (ref 4.8–5.6)
Mean Plasma Glucose: 136.98 mg/dL

## 2018-06-10 MED ORDER — SODIUM CHLORIDE 0.9 % IV SOLN
INTRAVENOUS | Status: AC
  Administered 2018-06-10: 15:00:00 via INTRAVENOUS

## 2018-06-10 NOTE — Progress Notes (Signed)
  Echocardiogram 2D Echocardiogram has been performed.  Darlina Sicilian M 06/10/2018, 2:24 PM

## 2018-06-10 NOTE — Evaluation (Signed)
Occupational Therapy Evaluation Patient Details Name: [name] Michael MRN: [id number] DOB: 08-28-1960 Today's Date: 06/10/2018    History of Present Illness 57 YO female admitted to ED for fall, ? syncope, R radius/ulnar fx   Pt with medical diagnosis of lower UTI. PMH includes poorly controlled DM II, HLD, HTN, CVA of L frontal region in 2011 and another in 2018, TIAs, AKI, CAP, diabetic retinopathy, depression, HLD, CKD stage 5, pt reporting blindness. Past surgical history includes ORIF L distal radius fracture 2017.   Clinical Impression   Pt was admitted for the above. She came in from ALF where she was modified independent with ADLs, except for bathing.  Pt has new R wrist fx, which is splinted.  Hand surgeon consulted.  Kept pt NWB; she usually uses cane in R hand vs RW.  Pt needs extensive assistance for adls as she is R dominant. Will follow in acute setting with min A level goals    Follow Up Recommendations  SNF    Equipment Recommendations  (? 3:1)    Recommendations for Other Services       Precautions / Restrictions Precautions Precautions: Fall Precaution Comments: check orthostatics Required Braces or Orthoses: (R splint and sling) Restrictions Weight Bearing Restrictions: Yes Other Position/Activity Restrictions: NWB      Mobility Bed Mobility Overal bed mobility: Needs Assistance Bed Mobility: Supine to Sit;Sit to Supine     Supine to sit: Min assist Sit to supine: Mod assist;+2 for safety/equipment   General bed mobility comments: assist for trunk sitting up and for legs for back to bed  Transfers Overall transfer level: Needs assistance Equipment used: 1 person hand held assist Transfers: Sit to/from Stand Sit to Stand: Min assist;+2 safety/equipment         General transfer comment: assist to rise and steady    Balance                                           ADL either performed or assessed with clinical judgement    ADL Overall ADL's : Needs assistance/impaired Eating/Feeding: Minimal assistance;Bed level   Grooming: Oral care;Minimal assistance;Bed level   Upper Body Bathing: Moderate assistance;Bed level;Sitting   Lower Body Bathing: Maximal assistance;Sit to/from stand   Upper Body Dressing : Moderate assistance;Bed level;Sitting   Lower Body Dressing: Total assistance;Sit to/from stand                 General ADL Comments: stood for orthostatic BPs; + for this, pt with delayed responses.  Returned to supine     Vision Patient Visual Report: (low vision able to see red button/call bell)       Perception     Praxis      Pertinent Vitals/Pain Pain Assessment: Faces Faces Pain Scale: Hurts little more Pain Location: RUE Pain Descriptors / Indicators: Aching Pain Intervention(s): Limited activity within patient's tolerance;Monitored during session;Repositioned     Hand Dominance Right   Extremity/Trunk Assessment Upper Extremity Assessment Upper Extremity Assessment: RUE deficits/detail;Generalized weakness(immobilized  able to move fingers)           Communication Communication Communication: (not very talkative)   Cognition Arousal/Alertness: (alert but delayed) Behavior During Therapy: WFL for tasks assessed/performed Overall Cognitive Status: No family/caregiver present to determine baseline cognitive functioning  General Comments: delayed verbal responses   General Comments  orthostatics +; see vitals section of chart. Difficulty to tell pt's response--she didn't speak a lot and had delayed responses    Exercises     Shoulder Instructions      Home Living Family/patient expects to be discharged to:: Skilled nursing facility                                 Additional Comments: from ALF.  Dresses, toilets and feeds herself. Assist for bathing      Prior Functioning/Environment           Comments: uses cane in R hand vs RW        OT Problem List: Decreased strength;Decreased activity tolerance;Impaired balance (sitting and/or standing);Impaired vision/perception;Decreased cognition;Decreased knowledge of use of DME or AE;Pain;Impaired UE functional use      OT Treatment/Interventions: Self-care/ADL training;Therapeutic exercise;DME and/or AE instruction;Energy conservation;Balance training;Patient/family education;Cognitive remediation/compensation;Therapeutic activities    OT Goals(Current goals can be found in the care plan section) Acute Rehab OT Goals Patient Stated Goal: none stated OT Goal Formulation: With patient Time For Goal Achievement: 06/24/18 Potential to Achieve Goals: Good ADL Goals Pt Will Perform Upper Body Dressing: with supervision;sitting Pt Will Perform Lower Body Dressing: with min assist;sit to/from stand Pt Will Transfer to Toilet: with min guard assist;ambulating;regular height toilet;bedside commode Pt Will Perform Toileting - Clothing Manipulation and hygiene: with min guard assist;sit to/from stand Additional ADL Goal #1: pt will perform bed mobility at supervision level in preparation for adls Additional ADL Goal #2: pt will respond to commands and questions within 8 seconds  OT Frequency: Min 2X/week   Barriers to D/C:            Co-evaluation              AM-PAC OT "6 Clicks" Daily Activity     Outcome Measure Help from another person eating meals?: A Little Help from another person taking care of personal grooming?: A Little Help from another person toileting, which includes using toliet, bedpan, or urinal?: A Lot Help from another person bathing (including washing, rinsing, drying)?: A Lot Help from another person to put on and taking off regular upper body clothing?: A Lot Help from another person to put on and taking off regular lower body clothing?: A Lot 6 Click Score: 14   End of Session    Activity Tolerance:  Patient limited by fatigue Patient left: in bed;with call bell/phone within reach;with bed alarm set  OT Visit Diagnosis: Unsteadiness on feet (R26.81);Pain Pain - Right/Left: Right Pain - part of body: Hand                Time: 4854-6270 OT Time Calculation (min): 27 min Charges:  OT General Charges $OT Visit: 1 Visit OT Evaluation $OT Eval Moderate Complexity: [place], OTR/L Acute Rehabilitation Services [phone] [facility] pager [phone] office 06/10/2018  Ruqayya Ventress 06/10/2018, 12:29 PM

## 2018-06-10 NOTE — Evaluation (Signed)
Physical Therapy Evaluation Patient Details Name: [name] Michael MRN: [id number] DOB: Aug 11, 1960 Today's Date: 06/10/2018   History of Present Illness  57 YO female admitted to ED for fall, ? syncope, R radius/ulnar fx   Pt with medical diagnosis of lower UTI. PMH includes poorly controlled DM II, HLD, HTN, CVA of L frontal region in 2011 and another in 2018, TIAs, AKI, CAP, diabetic retinopathy, depression, HLD, CKD stage 5, pt reporting blindness. Past surgical history includes ORIF L distal radius fracture 2017.  Clinical Impression   Pt presents with LE generalized weakness, difficulty performing bed mobility and transfers, orthostatic hypotension in standing per orthostatics assessment during session, and RUE pain due to fall and fracture. Pt to benefit from acute PT to address deficits. Pt with increased lethargy and eye closing in standing, unable to ambulate at this time due to this and orthostatic vitals. PT recommending d/c to ALF with OPPT. PT to progress mobility as tolerated, and will continue to follow acutely.      Follow Up Recommendations Outpatient PT(OPPT in ALF setting )    Equipment Recommendations  None recommended by PT    Recommendations for Other Services       Precautions / Restrictions Precautions Precautions: Fall Precaution Comments: check orthostatics Required Braces or Orthoses: Splint/Cast;Sling(R splint and sling) Restrictions Weight Bearing Restrictions: Yes Other Position/Activity Restrictions: NWB       Mobility  Bed Mobility Overal bed mobility: Needs Assistance Bed Mobility: Supine to Sit;Sit to Supine     Supine to sit: Min assist Sit to supine: Mod assist;+2 for safety/equipment   General bed mobility comments: Min assist for supine to sit for trunk elevation. Mod assist +2 for sit to supine for LE lifting and trunk management. PT and OT scooted pt up in bed with use of bed pad.   Transfers Overall transfer level: Needs  assistance Equipment used: 1 person hand held assist Transfers: Sit to/from Stand Sit to Stand: Min assist;+2 safety/equipment         General transfer comment: Min assist for power up, steadying. Pt with eye closing upon standing multiple times, pt encouraged to keep her eyes open. Pt appeared less alert in standing position, could be due to orthostatics (see "General comments").  Ambulation/Gait Ambulation/Gait assistance: (NT - pt with decreased alertness and orthostatic hypotension in standing. )              Stairs            Wheelchair Mobility    Modified Rankin (Stroke Patients Only)       Balance Overall balance assessment: Needs assistance Sitting-balance support: No upper extremity supported Sitting balance-Leahy Scale: Good     Standing balance support: Single extremity supported Standing balance-Leahy Scale: Fair                               Pertinent Vitals/Pain Pain Assessment: 0-10 Pain Score: 7  Faces Pain Scale: Hurts little more Pain Location: RUE  Pain Descriptors / Indicators: Other (Comment)("it just hurts") Pain Intervention(s): Limited activity within patient's tolerance;Repositioned;Monitored during session    [facility] expects to be discharged to:: Assisted living               Home Equipment: Kasandra Knudsen - single point;Walker - 2 wheels Additional Comments: [facility]     Prior Function Level of Independence: Needs assistance   Gait / Transfers Assistance Needed:  Pt reports ambulating with cane or RW without assistance, although pt reports L eye blindness and R eye near blindness. Pt reports that prior to fall, she was mobilizing in her room at the ALF independently.  ADL's / Homemaking Assistance Needed: Pt reports assist with bathing, but does her own dressing, toileting, and eating.   Comments: Pt reports only 1 fall recently, but chart review states multiple falls       Hand Dominance   Dominant Hand: Right    Extremity/Trunk Assessment   Upper Extremity Assessment Upper Extremity Assessment: Defer to OT evaluation    Lower Extremity Assessment Lower Extremity Assessment: Generalized weakness(able to perform active hip and knee flexion/extension in supine position)    Cervical / Trunk Assessment Cervical / Trunk Assessment: Normal  Communication   Communication: Other (comment)(Pt slow to respond, difficult to get answers from at times )  Cognition Arousal/Alertness: Awake/alert(alert, but delayed responses. Upon standing, pt appeared more lethargic and kept closing her eyes ) Behavior During Therapy: WFL for tasks assessed/performed Overall Cognitive Status: No family/caregiver present to determine baseline cognitive functioning                                 General Comments: delayed responses, periods of eye closing during session       General Comments General comments (skin integrity, edema, etc.): +orthostatics in standing, see orthostatics section in flowsheets. Pt reports feeling off, but does not describe this as dizziness. Pt unable to explain to PT/OT what she was feeling in standing, but periods of eye closing and increased alertness present in standing.     Exercises     Assessment/Plan    PT Assessment Patient needs continued PT services  PT Problem List Decreased strength;Pain;Decreased cognition;Decreased activity tolerance;Decreased knowledge of use of DME;Decreased balance;Decreased safety awareness;Decreased mobility       PT Treatment Interventions DME instruction;Therapeutic activities;Gait training;Therapeutic exercise;Patient/family education;Balance training;Functional mobility training    PT Goals (Current goals can be found in the Care Plan section)  Acute Rehab PT Goals Patient Stated Goal: none stated PT Goal Formulation: With patient Time For Goal Achievement: 06/24/18 Potential to  Achieve Goals: Good    Frequency Min 2X/week   Barriers to discharge        Co-evaluation PT/OT/SLP Co-Evaluation/Treatment: Yes Reason for Co-Treatment: For patient/therapist safety PT goals addressed during session: Mobility/safety with mobility OT goals addressed during session: ADL's and self-care       AM-PAC PT "6 Clicks" Mobility  Outcome Measure Help needed turning from your back to your side while in a flat bed without using bedrails?: A Lot Help needed moving from lying on your back to sitting on the side of a flat bed without using bedrails?: A Lot Help needed moving to and from a bed to a chair (including a wheelchair)?: A Little Help needed standing up from a chair using your arms (e.g., wheelchair or bedside chair)?: A Little Help needed to walk in hospital room?: A Lot Help needed climbing 3-5 steps with a railing? : A Lot 6 Click Score: 14    End of Session Equipment Utilized During Treatment: Gait belt Activity Tolerance: Patient limited by fatigue Patient left: in bed;with bed alarm set;Other (comment);with call bell/phone within reach(with OT still in room ) Nurse Communication: Mobility status PT Visit Diagnosis: Other abnormalities of gait and mobility (R26.89);History of falling (Z91.81)    Time: 3716-9678 PT Time Calculation (min) (  ACUTE ONLY): 27 min   Charges:   PT Evaluation $PT Eval Low Complexity: 1 Low          Maytal Mijangos Conception Chancy, PT Acute Rehabilitation Services Pager [phone]  Office [phone]   Roxine Caddy D Elonda Husky 06/10/2018, 12:58 PM

## 2018-06-10 NOTE — Clinical Social Work Note (Signed)
Clinical Social Work Assessment  Patient Details  Name: [name] Michael MRN: [id number] Date of Birth: 04/19/1961  Date of referral:  06/10/18               Reason for consult:  Discharge Planning                Permission sought to share information with:    Permission granted to share information::     Name::        Agency::     Relationship::     Contact Information:     Housing/Transportation Living arrangements for the past 2 months:  [place] of Information:  Patient Patient Interpreter Needed:  None Criminal Activity/Legal Involvement Pertinent to Current Situation/Hospitalization:  No - Comment as needed Significant Relationships:  Siblings Lives with:  Facility Resident Do you feel safe going back to the place where you live?  Yes Need for family participation in patient care:     Care giving concerns:  Patient is a current long-term resident at [facility]. Patient here after a fall resulting in a wrist injury.  Social Worker assessment / plan:  CSW spoke with patient at bedside. Patient reported she was "very tired" as she had been "up all day and all night". Per patient, she is from [place] and has been living there for about 2 years. Patient reports her plan would be to return back to the ALF and is agreeable to having PT/OT at the facility as that is what PT recommended. CSW to follow up with facility and ensure it is ok for patient to return with outpatient PT/OT.   Employment status:  Disabled (Comment on whether or not currently receiving Disability) Insurance information:  Self Pay (Medicaid Pending) PT Recommendations:  Outpatient Therapies Information / Referral to community resources:     Patient/Family's Response to care:  Patient minimally engaged as she was in and out of sleep during assessment. Patient stated she would like to return to ALF once discharged and is agreeable to having PT/OT at the facility.    Patient/Family's Understanding of and Emotional Response to Diagnosis, Current Treatment, and Prognosis:  Patient agreeable to return to ALF with outpatient PT/OT. CSW to follow up and ensure patient is able to return.   Emotional Assessment Appearance:  Appears stated age Attitude/Demeanor/Rapport:    Affect (typically observed):  Appropriate, Quiet Orientation:  Oriented to Self, Oriented to Situation, Oriented to Place, Oriented to  Time Alcohol / Substance use:    Psych involvement (Current and /or in the community):     Discharge Needs  Concerns to be addressed:  No discharge needs identified Readmission within the last 30 days:  No Current discharge risk:  None Barriers to Discharge:  No Barriers Identified   Ollen Barges, [place] 06/10/2018, 2:51 PM

## 2018-06-10 NOTE — Progress Notes (Signed)
PROGRESS NOTE    [name] Michael  GYI:[id number] DOB: [birth year] DOA: 06/09/2018 PCP: Charlott Rakes, MD   Brief Narrative: [name] Michael is a 57 y.o. female with medical history significant of hypertension, hyperlipidemia, diabetes mellitus type 2, history of stroke and TIA, history of chronic diastolic CHF, CKD stage V, and multiple other comorbidities who presents from her ALF after a fall.  Is unclear how the patient fell and she does not know how and she remembers is woke up on the floor.  States that she blacked out and states that she was walking in the next minute she knows she is on the floor.  Immediately after she fell to the floor she fell on her wrist and with complaints of pain.  She denies any chest pain, lightheadedness or dizziness.   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 associated with diabetes mellitus due to underlying condition (HCC)   Chronic diastolic CHF (congestive heart failure) (HCC)   CVA (cerebral vascular accident) ([place])   Accelerated hypertension   CKD (chronic kidney disease) stage 5, GFR less than 15 ml/min ([place])   Fall   Fall at home, initial encounter   Anemia of chronic disease   Syncope and collapse   1-Fall; with resultant fracture of the distal radius and ulna with impaction of the distal radial fracture site Ortho consulted. Dr Grandville Silos will see patient today in [place]. I spoke with Ortho PA today.  PT , OT>   Syncope; suspect hypovolemia, dehydration.  Follow ECHO.  Orthostatic vitals positive.  Will give IV fluids.   Hyperkalemia; resolved.   DM;  Continue with lantus and SSI.   HTN; hold hydralazine due to orthostatic hypotension.   HO TIA; continue with aspirin.   Anemia of chronic diseases.  Hb stable.   Chornic  Diastolic HF; compensated. Hold lasix due to renal failure.   AKI on CKD stage 5; Improved. Hold lasix. IV fluids    RN Pressure Injury Documentation:    Malnutrition Type:      Malnutrition Characteristics:      Nutrition Interventions:     Estimated body mass index is 24.01 kg/m as calculated from the following:   Height as of this encounter: 4\' 7"  (1.397 m).   Weight as of this encounter: 46.9 kg.   DVT prophylaxis: heparin  Code Status: Full code Family Communication: care discussed with patient.  Disposition Plan:    Consultants:   none   Procedures:   none  Antimicrobials:  none  Subjective: Not eating enough. Poor oral intake. Poor appetite.   Objective: Vitals:   06/09/18 2300 06/10/18 0540 06/10/18 1047 06/10/18 1222  BP: (!) 153/75 135/70  140/66  Pulse: 79 75  73  Resp:  16  19  Temp:  98 F (36.7 C)  98.1 F (36.7 C)  TempSrc:  Oral  Oral  SpO2:  100% 92% 94%  Weight:  46.9 kg    Height:        Intake/Output Summary (Last 24 hours) at 06/10/2018 1532 Last data filed at 06/10/2018 0930 Gross per 24 hour  Intake 2381.25 ml  Output 500 ml  Net 1881.25 ml   Filed Weights   06/09/18 1802 06/10/18 0540  Weight: 47.2 kg 46.9 kg    Examination:  General exam: Appears calm and comfortable  Respiratory system: Clear to auscultation. Respiratory effort normal. Cardiovascular system: S1 & S2 heard, RRR. No JVD, murmurs, rubs, gallops or clicks. No pedal edema. Gastrointestinal system: Abdomen is nondistended, soft and nontender. No organomegaly or masses felt. Normal bowel sounds heard. Central nervous system: Alert and oriented. No focal neurological deficits. Extremities: Symmetric 5 x 5 power. Left hand sling  Skin: No rashes, lesions or ulcers   Data Reviewed: I have personally reviewed following labs and imaging studies  CBC: Recent Labs  Lab 06/09/18 1239 06/09/18 1837 06/10/18 0652  WBC 7.3 8.4 7.5  NEUTROABS 5.2   --   --   HGB 9.8* 9.0* 8.8*  HCT 32.8* 29.7* 29.6*  MCV 99.1 97.7 97.7  PLT 255 254 532   Basic Metabolic Panel: Recent Labs  Lab 06/09/18 1036 06/09/18 1837 06/10/18 0652  NA 140  --  142  K 5.7*  --  4.8  CL 105  --  111  CO2 25  --  21*  GLUCOSE 75  --  73  BUN 68*  --  60*  CREATININE 5.41* 5.04* 4.43*  CALCIUM 8.7*  --  8.2*  MG  --  3.3*  --   PHOS  --  4.7*  --    GFR: Estimated Creatinine Clearance: 8.7 mL/min (A) (by C-G formula based on SCr of 4.43 mg/dL (H)). Liver Function Tests: Recent Labs  Lab 06/10/18 0652  AST 16  ALT 15  ALKPHOS 71  BILITOT 0.7  PROT 7.1  ALBUMIN 3.6   No results for input(s): LIPASE, AMYLASE in the last 168 hours. No results for input(s): AMMONIA in the last 168 hours. Coagulation Profile: No results for input(s): INR, PROTIME in the last 168 hours. Cardiac Enzymes: Recent Labs  Lab 06/09/18 1837 06/10/18 0007 06/10/18 0652  CKTOTAL 135  --   --   TROPONINI <0.03 <0.03 <0.03   BNP (last 3 results) No results for input(s): PROBNP in the last 8760 hours. HbA1C: Recent Labs    06/10/18 0652  HGBA1C 6.4*   CBG: Recent Labs  Lab 06/09/18 1634 06/09/18 2123 06/10/18 0805 06/10/18 0843 06/10/18 1129  GLUCAP 136* 125* 59* 76 175*   Lipid Profile: No results for input(s): CHOL, HDL, LDLCALC, TRIG, CHOLHDL, LDLDIRECT in the last 72 hours. Thyroid Function Tests: Recent Labs    06/09/18 1837  TSH 2.185   Anemia Panel: No results for input(s): VITAMINB12, FOLATE, FERRITIN, TIBC, IRON, RETICCTPCT in the last 72 hours. Sepsis Labs: No results for input(s): PROCALCITON, LATICACIDVEN in the last 168 hours.  Recent Results (from the past 240 hour(s))  Urine culture     Status: None   Collection Time: 06/09/18  2:02 PM  Result Value Ref Range Status   Specimen Description   Final    URINE, CLEAN CATCH Performed at [facility], [place] [address], [place], [place] [address]    Special Requests    Final    NONE Performed at [facility], [place] [address], [place], [place] [address]    Culture   Final    NO GROWTH Performed at [facility], [place] [address], [place], [place] [address]    Report Status 06/10/2018 FINAL  Final         Radiology Studies: X-ray Chest Pa And Lateral  Result Date: 06/10/2018 CLINICAL DATA:  Cough, shortness of breath, and weakness. EXAM: CHEST - 2 VIEW COMPARISON:  06/09/2018 FINDINGS: The  cardiac silhouette remains enlarged, and there is persistent anterior eventration of the right hemidiaphragm. Image quality is degraded on the lateral radiograph due to the patient's inability to raise the right arm. No airspace consolidation, edema, pleural effusion, or pneumothorax is identified. No acute osseous abnormality is seen. IMPRESSION: No active cardiopulmonary disease. Electronically Signed   By: Logan Bores M.D.   On: 06/10/2018 10:11   Dg Chest 2 View  Result Date: 06/09/2018 CLINICAL DATA:  Cough and weakness EXAM: CHEST - 2 VIEW COMPARISON:  03/10/2018 FINDINGS: Cardiac shadow is enlarged but stable. The lungs are well aerated bilaterally. No focal infiltrate or sizable effusion is seen. No acute bony abnormality is noted. IMPRESSION: No acute abnormality noted. Electronically Signed   By: Inez Catalina M.D.   On: 06/09/2018 14:37   Dg Wrist Complete Right  Result Date: 06/09/2018 CLINICAL DATA:  Pain following fall EXAM: RIGHT WRIST - COMPLETE 3+ VIEW COMPARISON:  None. FINDINGS: Frontal, oblique, lateral, and ulnar deviation scaphoid images were obtained. There is a fracture at the distal radial metaphysis-diaphysis junction with impaction. There is slight dorsal angulation distally. There is in addition a fracture of the distal ulnar metaphysis-diaphysis junction with overall alignment near anatomic. No other fractures are evident. No dislocations. 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 No erosive changes.  Bones are osteoporotic. IMPRESSION: Fractures of the distal radius and ulna with impaction at the distal radial fracture site. Bones osteoporotic. No dislocation. 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 Electronically Signed   By: Lowella Grip III M.D.   On: 06/09/2018 09:57   Ct Head Wo Contrast  Result Date: 06/09/2018 CLINICAL DATA:  Increasing falls. EXAM: CT HEAD WITHOUT CONTRAST CT CERVICAL SPINE WITHOUT CONTRAST TECHNIQUE: Multidetector CT imaging of the head and cervical spine was performed following the standard protocol without intravenous contrast. Multiplanar CT image reconstructions of the cervical spine were also generated. COMPARISON:  03/10/2018 FINDINGS: CT HEAD FINDINGS Brain: Chronic small-vessel ischemic changes of the cerebral hemispheric white matter. Old infarction in the left basal ganglia and radiating white matter tracts. No sign of acute infarction, mass lesion, hemorrhage, hydrocephalus or extra-axial collection. Vascular: There is atherosclerotic calcification of the major vessels at the base of the brain. Skull: No skull fracture. Sinuses/Orbits: Clear/normal Other: None CT CERVICAL SPINE FINDINGS Alignment: No traumatic malalignment. Skull base and vertebrae: No fracture or focal bone lesion. Soft tissues and spinal canal: Normal Disc levels: No significant disc degeneration. No canal or foraminal stenosis. No significant facet arthropathy. Upper chest: Negative Other: Known right parotid mass only partially visualized. IMPRESSION: Head CT: No acute or traumatic finding. Chronic small-vessel ischemic changes. 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 Cervical spine CT: Negative Electronically Signed   By: Nelson Chimes M.D.   On: 06/09/2018 16:13   Ct Cervical Spine Wo Contrast  Result Date: 06/09/2018 CLINICAL DATA:  Increasing falls. EXAM: CT HEAD WITHOUT CONTRAST CT CERVICAL SPINE WITHOUT CONTRAST TECHNIQUE: Multidetector CT imaging of the head and cervical  spine was performed following the standard protocol without intravenous contrast. Multiplanar CT image reconstructions of the cervical spine were also generated. COMPARISON:  03/10/2018 FINDINGS: CT HEAD FINDINGS Brain: Chronic small-vessel ischemic changes of the cerebral hemispheric white matter. Old infarction in the left basal ganglia and radiating white matter tracts. No sign of acute infarction, mass lesion, hemorrhage, hydrocephalus or extra-axial collection. Vascular: There is atherosclerotic calcification of the major vessels at the base of the brain. Skull: No skull fracture. Sinuses/Orbits: Clear/normal Other: None  CT CERVICAL SPINE FINDINGS Alignment: No traumatic malalignment. Skull base and vertebrae: No fracture or focal bone lesion. Soft tissues and spinal canal: Normal Disc levels: No significant disc degeneration. No canal or foraminal stenosis. No significant facet arthropathy. Upper chest: Negative Other: Known right parotid mass only partially visualized. IMPRESSION: Head CT: No acute or traumatic finding. Chronic small-vessel ischemic changes. 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 Cervical spine CT: Negative Electronically Signed   By: Nelson Chimes M.D.   On: 06/09/2018 16:13        Scheduled Meds: . amLODipine  10 mg Oral Daily  . aspirin EC  81 mg Oral Daily  . atorvastatin  40 mg Oral Daily  . famotidine  20 mg Oral Daily  . gabapentin  100 mg Oral BID  . heparin  5,000 Units Subcutaneous Q8H  . insulin aspart  0-5 Units Subcutaneous QHS  . insulin aspart  0-9 Units Subcutaneous TID WC  . insulin glargine  24 Units Subcutaneous Daily  . magnesium oxide  400 mg Oral BID  . metoprolol tartrate  100 mg Oral BID  . multivitamin with minerals  1 tablet Oral Daily  . pantoprazole  40 mg Oral Daily  . polyethylene glycol  17 g Oral Daily  . sodium chloride flush  3 mL Intravenous Q12H   Continuous Infusions: . sodium chloride 75 mL/hr at 06/10/18 1450     LOS: 0 days     Time spent: 35 minutes.     Elmarie Shiley, MD [facility] Pager [phone]  If 7PM-7AM, please contact night-coverage [email or web address] Password [facility] 06/10/2018, 3:32 PM

## 2018-06-11 LAB — BASIC METABOLIC PANEL
Anion gap: 10 (ref 5–15)
BUN: 52 mg/dL — ABNORMAL HIGH (ref 6–20)
CO2: 22 mmol/L (ref 22–32)
Calcium: 8.5 mg/dL — ABNORMAL LOW (ref 8.9–10.3)
Chloride: 109 mmol/L (ref 98–111)
Creatinine, Ser: 4.24 mg/dL — ABNORMAL HIGH (ref 0.44–1.00)
GFR calc Af Amer: 13 mL/min — ABNORMAL LOW (ref >60)
GFR calc non Af Amer: 11 mL/min — ABNORMAL LOW (ref >60)
GLUCOSE: 113 mg/dL — AB (ref 70–99)
Potassium: 4.7 mmol/L (ref 3.5–5.1)
Sodium: 141 mmol/L (ref 135–145)

## 2018-06-11 LAB — MAGNESIUM: Magnesium: 3.8 mg/dL — ABNORMAL HIGH (ref 1.7–2.4)

## 2018-06-11 LAB — GLUCOSE, CAPILLARY
Glucose-Capillary: 72 mg/dL (ref 70–99)
Glucose-Capillary: 207 mg/dL — ABNORMAL HIGH (ref 70–99)
Glucose-Capillary: 98 mg/dL (ref 70–99)
Glucose-Capillary: 83 mg/dL (ref 70–99)

## 2018-06-11 LAB — EXPECTORATED SPUTUM ASSESSMENT W REFEX TO RESP CULTURE

## 2018-06-11 LAB — EXPECTORATED SPUTUM ASSESSMENT W GRAM STAIN, RFLX TO RESP C

## 2018-06-11 LAB — MRSA PCR SCREENING: MRSA by PCR: POSITIVE — AB

## 2018-06-11 MED ORDER — TRAMADOL HCL 50 MG PO TABS: 25.0000 mg | ORAL_TABLET | Freq: Two times a day (BID) | ORAL | 0 refills | Status: DC | PRN

## 2018-06-11 MED ORDER — INSULIN GLARGINE 100 UNIT/ML ~~LOC~~ SOLN: 24.0000 [IU] | Freq: Every day | SUBCUTANEOUS | 0 refills | Status: DC

## 2018-06-11 MED ORDER — ACETAMINOPHEN 325 MG PO TABS: 650.0000 mg | ORAL_TABLET | ORAL | 2 refills | Status: DC | PRN

## 2018-06-11 MED ORDER — MUPIROCIN 2 % EX OINT
1.0000 "application " | TOPICAL_OINTMENT | Freq: Two times a day (BID) | CUTANEOUS | Status: DC
  Administered 2018-06-11 – 2018-06-15 (×9): 1 via NASAL
  Filled 2018-06-11 (×2): qty 22

## 2018-06-11 MED ORDER — CHLORHEXIDINE GLUCONATE CLOTH 2 % EX PADS
6.0000 | MEDICATED_PAD | Freq: Every day | CUTANEOUS | Status: DC
  Administered 2018-06-12 – 2018-06-15 (×4): 6 via TOPICAL

## 2018-06-11 MED ORDER — SENNOSIDES-DOCUSATE SODIUM 8.6-50 MG PO TABS: 1.0000 | ORAL_TABLET | Freq: Every evening | ORAL | 0 refills | Status: AC | PRN

## 2018-06-11 NOTE — [place] FL2 (Addendum)
[place] MEDICAID FL2 LEVEL OF CARE SCREENING TOOL     IDENTIFICATION  Patient Name: [name] Michael Birthdate: Apr 02, 1961 Sex: female Admission Date (Current Location): 06/09/2018  [facility] and [place] Number:  Herbalist and Address:  [facility],  [place] [address], [place]      Provider Number: [id number]  Attending Physician Name and Address:  Elmarie Shiley, MD  Relative Name and Phone Number:       Current Level of Care: Hospital Recommended Level of Care: [place] Prior Approval Number:    Date Approved/Denied:   PASRR Number:    Discharge Plan: Other (Comment)(ALF)    Current Diagnoses: Patient Active Problem List   Diagnosis Date Noted  . Fall 06/09/2018  . Fall at home, initial encounter 06/09/2018  . Anemia of chronic disease 06/09/2018  . Syncope and collapse 06/09/2018  . CKD (chronic kidney disease) stage 5, GFR less than 15 ml/min (HCC) 03/10/2018  . Acute encephalopathy 03/10/2018  . Hypermagnesemia 03/10/2018  . AKI (acute kidney injury) ([place]) 11/09/2016  . Hypercalcemia 11/09/2016  . Hyponatremia 11/09/2016  . Hypovolemia 11/09/2016  . Accelerated hypertension 11/09/2016  . Acute lower UTI 11/09/2016  . Nephrolithiasis 10/24/2016  . Constipation 10/22/2016  . CAP (community acquired pneumonia) 10/22/2016  . Hydronephrosis of right kidney 10/22/2016  . Metabolic acidosis 12/21/1599  . CVA (cerebral vascular accident) ([place]) 07/03/2016  . HAP (hospital-acquired pneumonia) 07/03/2016  . Sepsis secondary to UTI ([place])   . UTI due to extended-spectrum beta lactamase (ESBL) producing Escherichia coli   . Acute pyelonephritis   . Bacteremia due to Escherichia coli   . Colitis, indeterminate   . Uncontrolled type 2 diabetes mellitus with complication ([place])   . 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 associated with diabetes mellitus due to underlying condition ([place])   . Chronic  diastolic CHF (congestive heart failure) ([place])   . Abdominal pain   . Colitis   . Hypophosphatemia 06/12/2016  . Hypokalemia 06/11/2016  . Hypocalcemia 06/11/2016  . Acute kidney injury superimposed on chronic kidney disease ([place]) 06/11/2016  . Proliferative diabetic retinopathy ([place]) 04/29/2016  . Closed fracture of left distal radius 01/28/2016  . Poor social situation 03/09/2013  . Depression 03/01/2013  . Abnormal mammogram 12/20/2012  . Retinal detachment 11/17/2012  . 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 ([place]) 09/32/3557  . Hyperlipidemia 02/09/2007  . Essential hypertension 02/09/2007  . GERD 02/09/2007    Orientation RESPIRATION BLADDER Height & Weight     Self, Situation, Time, Place  Normal incontinent Weight: 100 lb 5 oz (45.5 kg) Height:  4\' 7"  (139.7 cm)  BEHAVIORAL SYMPTOMS/MOOD NEUROLOGICAL BOWEL NUTRITION STATUS      Continent (No table salt)  AMBULATORY STATUS COMMUNICATION OF NEEDS Skin   Limited Assist Verbally Normal                       Personal Care Assistance Level of Assistance  Bathing, Feeding, Dressing Bathing Assistance: Limited assistance Feeding assistance: Independent Dressing Assistance: Limited assistance     Functional Limitations Info  Sight, Hearing, Speech Sight Info: Adequate Hearing Info: Adequate Speech Info: Adequate    SPECIAL CARE FACTORS FREQUENCY  PT (By licensed PT), OT (By licensed OT)     PT Frequency: 5x/week  OT Frequency: 5x/week             Contractures Contractures Info: Not present    Additional Factors Info  Insulin Sliding Scale Code Status Info: Fullcode  Allergies Info: Allergies: Hydrocodone   Insulin Sliding Scale Info: See Medication list  Glucose check 3x's a day.        Current Medications (06/11/2018):    Medication List    STOP taking these medications   calcium carbonate 500 MG chewable tablet Commonly known as:  TUMS - dosed in mg elemental  calcium   cephALEXin 250 MG capsule Commonly known as:  KEFLEX   furosemide 20 MG tablet Commonly known as:  LASIX   hydrALAZINE 25 MG tablet Commonly known as:  APRESOLINE   magnesium oxide 400 MG tablet Commonly known as:  MAG-OX     TAKE these medications   acetaminophen 325 MG tablet Commonly known as:  TYLENOL Take 2 tablets (650 mg total) by mouth every 4 (four) hours as needed. What changed:    when to take this  reasons to take this   amLODipine 10 MG tablet Commonly known as:  NORVASC Take 10 mg by mouth daily.   aspirin 81 MG tablet Take 1 tablet (81 mg total) by mouth daily.   atorvastatin 40 MG tablet Commonly known as:  LIPITOR Take 40 mg by mouth daily.   CERTAVITE SENIOR/ANTIOXIDANT Tabs Take 1 tablet by mouth daily.   diphenhydrAMINE 25 MG tablet Commonly known as:  BENADRYL Take 25 mg by mouth every 4 (four) hours as needed for itching.   gabapentin 100 MG capsule Commonly known as:  NEURONTIN Take 100 mg by mouth 2 (two) times daily.   insulin aspart 100 UNIT/ML injection Commonly known as:  novoLOG Inject 8 Units into the skin 3 (three) times daily with meals.   insulin glargine 100 UNIT/ML injection Commonly known as:  LANTUS Inject 0.24 mLs (24 Units total) into the skin daily. What changed:  how much to take   lidocaine 5 % Commonly known as:  LIDODERM Place 1 patch onto the skin daily. Remove & Discard patch within 12 hours or as directed by MD   metoprolol tartrate 100 MG tablet Commonly known as:  LOPRESSOR Take 100 mg by mouth 2 (two) times daily.   omeprazole 40 MG capsule Commonly known as:  PRILOSEC Take 40 mg by mouth daily.   polyethylene glycol packet Commonly known as:  MIRALAX / GLYCOLAX Take 17 g by mouth daily.   PROBIOTIC PO Take 1 tablet by mouth 2 (two) times daily.   ranitidine 150 MG tablet Commonly known as:  ZANTAC Take 1 tablet (150 mg total) by mouth 2 (two) times daily.    senna-docusate 8.6-50 MG tablet Commonly known as:  Senokot-S Take 1 tablet by mouth at bedtime as needed for mild constipation.   traMADol 50 MG tablet Commonly known as:  ULTRAM Take 0.5 tablets (25 mg total) by mouth every 12 (twelve) hours as needed.   Vitamin D (Ergocalciferol) 1.25 MG (50000 UT) Caps capsule Commonly known as:  DRISDOL Take 1 capsule (50,000 Units total) by mouth every 7 (seven) days.         Follow-up Information    Milly Jakob, MD Follow up.   Specialty:  Orthopedic Surgery Why:  office will call you to arrange the next appt for week of 12-30 Contact information: [address] [place] [address] [phone]          Relevant Imaging Results:  Relevant Lab Results:   Additional Information SSN: [id number]  [place], LCSW

## 2018-06-11 NOTE — Progress Notes (Addendum)
Physical Therapy evaluation pending discharge to determine if the patient can return.  CSW faxed Discharge Summary and FL2.   CSW discussed current level of care with resident supervisor-Patrick. Per Saralyn Pilar, the patient may need SNF level of care.  CSW informed to call Resident Director Wells Guiles  tomorrow to determine if the facility can provide current level of care. Weekend Education officer, museum will follow up in the am.   Kathrin Greathouse, Marlinda Mike, MSW Clinical Social Worker  [phone] 06/11/2018  4:17 PM

## 2018-06-11 NOTE — Progress Notes (Signed)
CSW reached out and a left voicemail for [employer] residence coordinator Izora Ribas.   Social Work will continue to assist with discharge back to ALF.   Kathrin Greathouse, Marlinda Mike, MSW Clinical Social Worker  [phone] 06/11/2018  10:19 AM

## 2018-06-11 NOTE — Progress Notes (Signed)
Physical Therapy Treatment Patient Details Name: [name] Michael MRN: [id number] DOB: 1961-05-24 Today's Date: 06/11/2018    History of Present Illness 57 YO female admitted to ED for fall, ? syncope, R radius/ulnar fx   Pt with medical diagnosis of lower UTI. PMH includes poorly controlled DM II, HLD, HTN, CVA of L frontal region in 2011 and another in 2018, TIAs, AKI, CAP, diabetic retinopathy, depression, HLD, CKD stage 5, pt reporting blindness. Past surgical history includes ORIF L distal radius fracture 2017.    PT Comments    Pt in bed incont urine.  General bed mobility comments: pt was able to self transfer from supine to EOB with increased time, elevated HOB and use of LEFT rail.  Pt stated she has a similiar bed at ALF.  General transfer comment: pt was able to self rise with very light hand held assist from bed.  Also perormed a toilet transfer at light hand held assist for safety.  Pt able to rise and lower.  Pt able to self perform peri care but required Min Assist for balance as pt performed.  General Gait Details: attempted gait with R platform walker however pt was unable to steer straight and unable to safely maneuver through doorway or around furniture.  Therapist had to "drive" walker correctly.  Pt unable to perform "equal push" due to R UE in cast which restricted her movement.  Ended up amb pt Min Hand Held Assist as she was too unsteady for a cane.  For short distances, pt will need Min Hand Held Assist and for long distances a wheelchair.  Unsteady gait.  HIGH FALL RISK.   Follow Up Recommendations  Home health PT;SNF(needs assist with all ADL's and mobility if ALF can provide that is pt preference )     Equipment Recommendations  None recommended by PT(pt is unable to safely/correctly use a platform walker )    Recommendations for Other Services       Precautions / Restrictions Precautions Precautions: Fall Required Braces or Orthoses:  Splint/Cast;Sling Restrictions Weight Bearing Restrictions: Yes Other Position/Activity Restrictions: NWB R wrist but can WB thru elbow to use a platform walker, per Ortho  MD    Mobility  Bed Mobility Overal bed mobility: Needs Assistance Bed Mobility: Supine to Sit     Supine to sit: Supervision     General bed mobility comments: pt was able to self transfer from supine to EOB with increased time, elevated HOB and use of LEFT rail.  Pt stated she has a similiar bed at ALF  Transfers Overall transfer level: Needs assistance Equipment used: 1 person hand held assist Transfers: Sit to/from Stand;Stand Pivot Transfers Sit to Stand: Supervision;Min guard Stand pivot transfers: Supervision;Min guard       General transfer comment: pt was able to self rise with very light hand held assist from bed.  Also perormed a toilet transfer at light hand held assist for safety.  Pt able to rise and lower.  Pt able to self perform peri care but required Min Assist for balance as pt performed.    Ambulation/Gait Ambulation/Gait assistance: Min assist Gait Distance (Feet): 22 Feet(to and from bathroom) Assistive device: Rolling walker (2 wheeled);Right platform walker;1 person hand held assist Gait Pattern/deviations: Step-to pattern;Drifts right/left Gait velocity: decreased    General Gait Details: attempted gait with R platform walker however pt was unable to steer straight and unable to safely maneuver through doorway or around furniture.  Therapist had to "drive" walker  carrectly.  Pt unable to perform "equal push" due to R UE in cast which restricted her movement.  Ended up amb pt Min Hand Held Assist as she was too unsteady for a cane.  For short distances, pt will need Min Hnad Held Assist and for long distances a wheelchair.  Unsteady gait.  HIGH FALL RISK.   Stairs             Wheelchair Mobility    Modified Rankin (Stroke Patients Only)       Balance                                             Cognition Arousal/Alertness: Awake/alert                                     General Comments: "I'm sleepy" ststed pt      Exercises      General Comments        Pertinent Vitals/Pain Pain Assessment: Faces Faces Pain Scale: Hurts a little bit Pain Location: R wrist  Pain Descriptors / Indicators: Grimacing Pain Intervention(s): Monitored during session;Repositioned    Home Living                      Prior Function            PT Goals (current goals can now be found in the care plan section)      Frequency    Min 2X/week      PT Plan Current plan remains appropriate    Co-evaluation              AM-PAC PT "6 Clicks" Mobility   Outcome Measure  Help needed turning from your back to your side while in a flat bed without using bedrails?: A Little Help needed moving from lying on your back to sitting on the side of a flat bed without using bedrails?: A Little Help needed moving to and from a bed to a chair (including a wheelchair)?: A Little Help needed standing up from a chair using your arms (e.g., wheelchair or bedside chair)?: A Little Help needed to walk in hospital room?: A Little Help needed climbing 3-5 steps with a railing? : A Lot 6 Click Score: 17    End of Session Equipment Utilized During Treatment: Gait belt Activity Tolerance: Patient limited by fatigue Patient left: in chair;with nursing/sitter in room;with call bell/phone within reach Nurse Communication: Mobility status PT Visit Diagnosis: Other abnormalities of gait and mobility (R26.89);History of falling (Z91.81)     Time: 4650-3546 PT Time Calculation (min) (ACUTE ONLY): 28 min  Charges:  $Gait Training: 8-22 mins $Therapeutic Exercise: 8-22 mins                     Rica Koyanagi  PTA Acute  Rehabilitation Services Pager      [phone] Office      [phone]

## 2018-06-11 NOTE — Progress Notes (Signed)
OT Note:  Noted plan is for pt to return to ALF. She will need assistance for all ADLs.  PT is assessing her ability to walk to dining room unassisted.  Lesle Chris, OTR/L Acute Rehabilitation Services [phone] [facility] pager [phone] office 06/11/2018

## 2018-06-11 NOTE — Progress Notes (Signed)
Pt O2 removed and O2 stats range from 92-95% on RA. Will continue to monitor.  Levent Kornegay A

## 2018-06-11 NOTE — Discharge Summary (Signed)
Physician Discharge Summary  [name] Michael YTK:[id number] DOB: July 06, 1960 DOA: 06/09/2018  PCP: Charlott Rakes, MD  Admit date: 06/09/2018 Discharge date: 06/11/2018  Admitted From: ALF Disposition:  ALF  Recommendations for Outpatient Follow-up:  1. Follow up with PCP in 1-2 weeks 2. Please obtain BMP/CBC in one week 3. Needs to follow up with Dr Grandville Silos for wrist fracture, his office will arrange follow up.  4. Please monitor volume status. Resume lasix as needed.   Home Health: yes  Discharge Condition: stable.  CODE STATUS:full code.  Diet recommendation: Heart Healthy   Brief/Interim Summary: Brief Narrative: [name] L Nanceis a 57 y.o.femalewith medical history significant ofhypertension, hyperlipidemia, diabetes mellitus type 2, history of stroke and TIA, history of chronic diastolic CHF, CKD stage V, and multiple other comorbidities who presents from her ALF after a fall. Is unclear how the patient fell and she does not know how and she remembers is woke up on the floor. States that she blacked out and states that she was walking in the next minute she knows she is on the floor. Immediately after she fell to the floor she fell on her wrist and with complaints of pain. She denies any chest pain, lightheadedness or dizziness.   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 associated with diabetes mellitus due to underlying condition (HCC)   Chronic diastolic CHF (congestive heart failure) (HCC)   CVA (cerebral vascular accident) ([place])   Accelerated hypertension   CKD (chronic kidney disease) stage 5, GFR less than 15 ml/min ([place])   Fall   Fall at home, initial encounter   Anemia of chronic disease   Syncope and collapse   1-Fall; with resultantfracture  of the distal radius and ulna with impaction of the distal radial fracture site Discussed with Dr Grandville Silos, continue with splint, patient to follow in the office PT , OT>  per Dr Grandville Silos ok to use platform walker for ambulation.  On splint   Syncope; suspect hypovolemia, dehydration.  Echo normal ef Orthostatic vitals positive.  Received IV fluids.  Holding hydralazine lasix at discharge/   Hyperkalemia; resolved.   DM;  Continue with lantus and SSI.   HTN; hold hydralazine due to orthostatic hypotension.  continue with norvasc.   HO TIA; continue with aspirin.   Anemia of chronic diseases.  Hb stable.   [place] Diastolic HF; compensated. Hold lasix due to renal failure.   AKI on CKD stage 5; Improved. Hold lasix. IV fluids  monitor volume status out patient.  Resume lasix as needed.  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 associated with diabetes mellitus due to underlying condition (HCC)   Chronic diastolic CHF (congestive heart failure) (HCC)   CVA (cerebral vascular accident) ([place])   Accelerated hypertension   CKD (chronic kidney disease) stage 5, GFR less than 15 ml/min ([place])   Fall   Fall at home, initial encounter   Anemia of chronic disease   Syncope and collapse    Discharge Instructions  Discharge Instructions    Diet - low sodium heart healthy  Complete by:  As directed    Increase activity slowly   Complete by:  As directed      Allergies as of 06/11/2018      Reactions   Hydrocodone Nausea And Vomiting      Medication List    STOP taking these medications   calcium carbonate 500 MG chewable tablet Commonly known as:  TUMS - dosed in mg elemental calcium   cephALEXin 250 MG  capsule Commonly known as:  KEFLEX   furosemide 20 MG tablet Commonly known as:  LASIX   hydrALAZINE 25 MG tablet Commonly known as:  APRESOLINE   magnesium oxide 400 MG tablet Commonly known as:  MAG-OX     TAKE these medications   acetaminophen 325 MG tablet Commonly known as:  TYLENOL Take 2 tablets (650 mg total) by mouth every 4 (four) hours as needed. What changed:    when to take this  reasons to take this   amLODipine 10 MG tablet Commonly known as:  NORVASC Take 10 mg by mouth daily.   aspirin 81 MG tablet Take 1 tablet (81 mg total) by mouth daily.   atorvastatin 40 MG tablet Commonly known as:  LIPITOR Take 40 mg by mouth daily.   CERTAVITE SENIOR/ANTIOXIDANT Tabs Take 1 tablet by mouth daily.   diphenhydrAMINE 25 MG tablet Commonly known as:  BENADRYL Take 25 mg by mouth every 4 (four) hours as needed for itching.   gabapentin 100 MG capsule Commonly known as:  NEURONTIN Take 100 mg by mouth 2 (two) times daily.   insulin aspart 100 UNIT/ML injection Commonly known as:  novoLOG Inject 8 Units into the skin 3 (three) times daily with meals.   insulin glargine 100 UNIT/ML injection Commonly known as:  LANTUS Inject 0.24 mLs (24 Units total) into the skin daily. What changed:  how much to take   lidocaine 5 % Commonly known as:  LIDODERM Place 1 patch onto the skin daily. Remove & Discard patch within 12 hours or as directed by MD   metoprolol tartrate 100 MG tablet Commonly known as:  LOPRESSOR Take 100 mg by mouth 2 (two) times daily.   omeprazole 40 MG capsule Commonly known as:  PRILOSEC Take 40 mg by mouth daily.   polyethylene glycol packet Commonly known as:  MIRALAX / GLYCOLAX Take 17 g by mouth daily.   PROBIOTIC PO Take 1 tablet by mouth 2 (two) times daily.   ranitidine 150 MG tablet Commonly known as:  ZANTAC Take 1 tablet (150 mg total) by mouth 2 (two) times daily.   senna-docusate 8.6-50 MG tablet Commonly known as:   Senokot-S Take 1 tablet by mouth at bedtime as needed for mild constipation.   traMADol 50 MG tablet Commonly known as:  ULTRAM Take 0.5 tablets (25 mg total) by mouth every 12 (twelve) hours as needed.   Vitamin D (Ergocalciferol) 1.25 MG (50000 UT) Caps capsule Commonly known as:  DRISDOL Take 1 capsule (50,000 Units total) by mouth every 7 (seven) days.      Follow-up Information    Milly Jakob, MD Follow up.   Specialty:  Orthopedic Surgery Why:  office will call you to arrange the next appt for week of 12-30 Contact information: [address] [place] [address] [phone]          Allergies  Allergen Reactions  . Hydrocodone Nausea And Vomiting    Consultations:  Dr Grandville Silos ortho    Procedures/Studies: X-ray Chest Pa And Lateral  Result Date: 06/10/2018 CLINICAL DATA:  Cough, shortness of breath, and weakness. EXAM: CHEST - 2 VIEW COMPARISON:  06/09/2018 FINDINGS: The cardiac silhouette remains enlarged, and there is persistent anterior eventration of the right hemidiaphragm. Image quality is degraded on the lateral radiograph due to the patient's inability to raise the right arm. No airspace consolidation, edema, pleural effusion, or pneumothorax is identified. No acute osseous abnormality is seen. IMPRESSION: No active cardiopulmonary disease. Electronically Signed   By: Logan Bores M.D.   On: 06/10/2018 10:11   Dg Chest 2 View  Result Date: 06/09/2018 CLINICAL DATA:  Cough and weakness EXAM: CHEST - 2 VIEW COMPARISON:  03/10/2018 FINDINGS: Cardiac shadow is enlarged but stable. The lungs are well aerated bilaterally. No focal infiltrate or sizable effusion is seen. No acute bony abnormality is noted. IMPRESSION: No acute abnormality noted. Electronically Signed   By: Inez Catalina M.D.   On: 06/09/2018 14:37   Dg Wrist Complete Right  Result Date: 06/09/2018 CLINICAL DATA:  Pain following fall EXAM: RIGHT WRIST - COMPLETE 3+ VIEW COMPARISON:   None. FINDINGS: Frontal, oblique, lateral, and ulnar deviation scaphoid images were obtained. There is a fracture at the distal radial metaphysis-diaphysis junction with impaction. There is slight dorsal angulation distally. There is in addition a fracture of the distal ulnar metaphysis-diaphysis junction with overall alignment near anatomic. No other fractures are evident. No dislocations. 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 No erosive changes. Bones are osteoporotic. IMPRESSION: Fractures of the distal radius and ulna with impaction at the distal radial fracture site. Bones osteoporotic. No dislocation. 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 Electronically Signed   By: Lowella Grip III M.D.   On: 06/09/2018 09:57   Ct Head Wo Contrast  Result Date: 06/09/2018 CLINICAL DATA:  Increasing falls. EXAM: CT HEAD WITHOUT CONTRAST CT CERVICAL SPINE WITHOUT CONTRAST TECHNIQUE: Multidetector CT imaging of the head and cervical spine was performed following the standard protocol without intravenous contrast. Multiplanar CT image reconstructions of the cervical spine were also generated. COMPARISON:  03/10/2018 FINDINGS: CT HEAD FINDINGS Brain: Chronic small-vessel ischemic changes of the cerebral hemispheric white matter. Old infarction in the left basal ganglia and radiating white matter tracts. No sign of acute infarction, mass lesion, hemorrhage, hydrocephalus or extra-axial collection. Vascular: There is atherosclerotic calcification of the major vessels at the base of the brain. Skull: No skull fracture. Sinuses/Orbits: Clear/normal Other: None CT CERVICAL SPINE FINDINGS Alignment: No traumatic malalignment. Skull base and vertebrae: No fracture or focal bone lesion. Soft tissues and spinal canal: Normal Disc levels: No significant disc degeneration. No canal or foraminal stenosis. No significant facet arthropathy. Upper chest: Negative Other: Known right parotid  mass only partially visualized. IMPRESSION: Head CT: No acute or traumatic finding. Chronic small-vessel ischemic changes. 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 Cervical spine CT: Negative Electronically Signed   By: Nelson Chimes M.D.   On: 06/09/2018 16:13   Ct Cervical Spine Wo Contrast  Result Date: 06/09/2018 CLINICAL DATA:  Increasing falls. EXAM: CT HEAD WITHOUT CONTRAST CT CERVICAL SPINE WITHOUT CONTRAST TECHNIQUE: Multidetector CT imaging of the head and cervical spine was performed following the standard protocol without intravenous contrast. Multiplanar CT image reconstructions of the cervical spine were also generated. COMPARISON:  03/10/2018 FINDINGS: CT HEAD FINDINGS Brain: Chronic small-vessel ischemic changes of the cerebral hemispheric white matter. Old infarction in the left basal ganglia and radiating white matter tracts. No sign of acute infarction, mass lesion, hemorrhage, hydrocephalus or extra-axial collection. Vascular:  There is atherosclerotic calcification of the major vessels at the base of the brain. Skull: No skull fracture. Sinuses/Orbits: Clear/normal Other: None CT CERVICAL SPINE FINDINGS Alignment: No traumatic malalignment. Skull base and vertebrae: No fracture or focal bone lesion. Soft tissues and spinal canal: Normal Disc levels: No significant disc degeneration. No canal or foraminal stenosis. No significant facet arthropathy. Upper chest: Negative Other: Known right parotid mass only partially visualized. IMPRESSION: Head CT: No acute or traumatic finding. Chronic small-vessel ischemic changes. 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 Cervical spine CT: Negative Electronically Signed   By: Nelson Chimes M.D.   On: 06/09/2018 16:13      Subjective: She is feeling better. Appetite improved  Discharge Exam: Vitals:   06/11/18 0911 06/11/18 1256  BP:  137/70  Pulse:  70  Resp:  16  Temp:  98.7 F (37.1 C)  SpO2: 93% 92%   Vitals:   06/11/18 0500 06/11/18 0654 06/11/18  0911 06/11/18 1256  BP:  133/69  137/70  Pulse:  74  70  Resp:  18  16  Temp:  97.7 F (36.5 C)  98.7 F (37.1 C)  TempSrc:  Oral  Oral  SpO2:  100% 93% 92%  Weight: 45.5 kg     Height:        General: Pt is alert, awake, not in acute distress Cardiovascular: RRR, S1/S2 +, no rubs, no gallops Respiratory: CTA bilaterally, no wheezing, no rhonchi Abdominal: Soft, NT, ND, bowel sounds + Extremities: no edema, no cyanosis, right arm splint     The results of significant diagnostics from this hospitalization (including imaging, microbiology, ancillary and laboratory) are listed below for reference.     Microbiology: Recent Results (from the past 240 hour(s))  Urine culture     Status: None   Collection Time: 06/09/18  2:02 PM  Result Value Ref Range Status   Specimen Description   Final    URINE, CLEAN CATCH Performed at [facility], [place] [address] [place], [place] [address]    Special Requests   Final    NONE Performed at [facility], [place] [address], [place], [place] [address]    Culture   Final    NO GROWTH Performed at [facility], [place] [name], [place], [place] [address]    Report Status 06/10/2018 FINAL  Final  MRSA PCR Screening     Status: Abnormal   Collection Time: 06/11/18  9:30 AM  Result Value Ref Range Status   MRSA by PCR POSITIVE (A) NEGATIVE Final    Comment:        The GeneXpert MRSA Assay (FDA approved for NASAL specimens only), is one component of a comprehensive MRSA colonization surveillance program. It is not intended to diagnose MRSA infection nor to guide or monitor treatment for MRSA infections. RESULT CALLED TO, READ BACK BY AND VERIFIED WITH: Cherly Anderson [id number] @ 7591 BY J SCOTTON Performed at [place] [address] [place], [place] [address]      Labs: BNP (last 3 results) No results for input(s): BNP in the last 8760 hours. Basic Metabolic Panel: Recent  Labs  Lab 06/09/18 1036 06/09/18 1837 06/10/18 0652 06/11/18 0755  NA 140  --  142 141  K 5.7*  --  4.8 4.7  CL 105  --  111 109  CO2 25  --  21* 22  GLUCOSE 75  --  73 113*  BUN 68*  --  60*  52*  CREATININE 5.41* 5.04* 4.43* 4.24*  CALCIUM 8.7*  --  8.2* 8.5*  MG  --  3.3*  --  3.8*  PHOS  --  4.7*  --   --    Liver Function Tests: Recent Labs  Lab 06/10/18 0652  AST 16  ALT 15  ALKPHOS 71  BILITOT 0.7  PROT 7.1  ALBUMIN 3.6   No results for input(s): LIPASE, AMYLASE in the last 168 hours. No results for input(s): AMMONIA in the last 168 hours. CBC: Recent Labs  Lab 06/09/18 1239 06/09/18 1837 06/10/18 0652  WBC 7.3 8.4 7.5  NEUTROABS 5.2  --   --   HGB 9.8* 9.0* 8.8*  HCT 32.8* 29.7* 29.6*  MCV 99.1 97.7 97.7  PLT 255 254 233   Cardiac Enzymes: Recent Labs  Lab 06/09/18 1837 06/10/18 0007 06/10/18 0652  CKTOTAL 135  --   --   TROPONINI <0.03 <0.03 <0.03   BNP: Invalid input(s): POCBNP CBG: Recent Labs  Lab 06/10/18 1129 06/10/18 1633 06/10/18 2030 06/11/18 0651 06/11/18 1254  GLUCAP 175* 201* 132* 72 207*   D-Dimer No results for input(s): DDIMER in the last 72 hours. Hgb A1c Recent Labs    06/10/18 0652  HGBA1C 6.4*   Lipid Profile No results for input(s): CHOL, HDL, LDLCALC, TRIG, CHOLHDL, LDLDIRECT in the last 72 hours. Thyroid function studies Recent Labs    06/09/18 1837  TSH 2.185   Anemia work up No results for input(s): VITAMINB12, FOLATE, FERRITIN, TIBC, IRON, RETICCTPCT in the last 72 hours. Urinalysis    Component Value Date/Time   COLORURINE YELLOW 06/09/2018 1402   APPEARANCEUR CLEAR 06/09/2018 1402   LABSPEC 1.009 06/09/2018 1402   PHURINE 7.0 06/09/2018 1402   GLUCOSEU NEGATIVE 06/09/2018 1402   HGBUR NEGATIVE 06/09/2018 1402   BILIRUBINUR NEGATIVE 06/09/2018 1402   BILIRUBINUR n 05/25/2012 1202   KETONESUR NEGATIVE 06/09/2018 1402   PROTEINUR 100 (A) 06/09/2018 1402   UROBILINOGEN 0.2 05/25/2012 1202    UROBILINOGEN 0.2 03/29/2011 0131   NITRITE NEGATIVE 06/09/2018 1402   LEUKOCYTESUR NEGATIVE 06/09/2018 1402   Sepsis Labs Invalid input(s): PROCALCITONIN,  WBC,  LACTICIDVEN Microbiology Recent Results (from the past 240 hour(s))  Urine culture     Status: None   Collection Time: 06/09/18  2:02 PM  Result Value Ref Range Status   Specimen Description   Final    URINE, CLEAN CATCH Performed at [facility], [place] [address] [place], [place] [address]    Special Requests   Final    NONE Performed at [facility], [place] [address], [place], [place] [address]    Culture   Final    NO GROWTH Performed at [facility], [place] [address], [place], [place] [address]    Report Status 06/10/2018 FINAL  Final  MRSA PCR Screening     Status: Abnormal   Collection Time: 06/11/18  9:30 AM  Result Value Ref Range Status   MRSA by PCR POSITIVE (A) NEGATIVE Final    Comment:        The GeneXpert MRSA Assay (FDA approved for NASAL specimens only), is one component of a comprehensive MRSA colonization surveillance program. It is not intended to diagnose MRSA infection nor to guide or monitor treatment for MRSA infections. RESULT CALLED TO, READ BACK BY AND VERIFIED WITH: Cherly Anderson [id number] @ 6237 BY J SCOTTON Performed at [facility] [address] [place], [place] [address]  Time coordinating discharge: 35 minutes.   SIGNED:   Elmarie Shiley, MD  [facility] 06/11/2018, 3:31 PM Pager   If 7PM-7AM, please contact night-coverage [email or web address] Password [email or web address]

## 2018-06-12 LAB — GLUCOSE, CAPILLARY
GLUCOSE-CAPILLARY: 124 mg/dL — AB (ref 70–99)
Glucose-Capillary: 104 mg/dL — ABNORMAL HIGH (ref 70–99)
Glucose-Capillary: 221 mg/dL — ABNORMAL HIGH (ref 70–99)
Glucose-Capillary: 65 mg/dL — ABNORMAL LOW (ref 70–99)
Glucose-Capillary: 95 mg/dL (ref 70–99)
Glucose-Capillary: 168 mg/dL — ABNORMAL HIGH (ref 70–99)

## 2018-06-12 NOTE — Progress Notes (Signed)
PROGRESS NOTE    [name] Michael  GUR:[id number] DOB: September 19, 1960 DOA: 06/09/2018 PCP: Charlott Rakes, MD   Brief Narrative: [name] Michael is a 57 y.o. female with medical history significant of hypertension, hyperlipidemia, diabetes mellitus type 2, history of stroke and TIA, history of chronic diastolic CHF, CKD stage V, and multiple other comorbidities who presents from her ALF after a fall.  Is unclear how the patient fell and she does not know how and she remembers is woke up on the floor.  States that she blacked out and states that she was walking in the next minute she knows she is on the floor.  Immediately after she fell to the floor she fell on her wrist and with complaints of pain.  She denies any chest pain, lightheadedness or dizziness.   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 Keep patient on splint  PT , OT>  Required SNF , awaiting placement  Syncope; suspect hypovolemia, dehydration. Orthostatic hypotension.  ECHO normal EF Orthostatic vitals positive.  Received IV fluids.   Hyperkalemia; resolved.   DM;  Continue with lantus and SSI.   HTN; hold hydralazine due to orthostatic hypotension.  On norvasc.   HO TIA; continue with aspirin.    Anemia of chronic diseases.  Hb stable.   Chornic Diastolic HF; compensated. Hold lasix due to renal failure.   AKI on CKD stage 5; Improved. Hold lasix. IV fluids  Repeat lab in am.   RN Pressure Injury Documentation:    Malnutrition Type:      Malnutrition Characteristics:      Nutrition Interventions:     Estimated body mass index is 23.31 kg/m as calculated from the following:   Height as of this encounter: 4\' 7"  (1.397 m).   Weight as of this encounter: 45.5 kg.   DVT prophylaxis: heparin  Code Status: Full code Family Communication: care discussed with patient.  Disposition Plan:    Consultants:   none   Procedures:   none  Antimicrobials:  none  Subjective: No new complaint, eating some Objective: Vitals:   06/11/18 1256 06/11/18 2045 06/12/18 0553 06/12/18 1257  BP: 137/70 (!) 156/70 (!) 155/85 136/66  Pulse: 70 76 72 70  Resp: 16 18 18 20   Temp: 98.7 F (37.1 C) 99 F (37.2 C) 98.7 F (37.1 C) 98.8 F (37.1 C)  TempSrc: Oral Oral Oral Oral  SpO2: 92% 93% 91% 94%  Weight:      Height:        Intake/Output Summary (Last 24 hours) at 06/12/2018 1609 Last data filed at 06/12/2018 1213 Gross per 24 hour  Intake 240 ml  Output 700 ml  Net -460 ml   Filed Weights   06/09/18 1802 06/10/18 0540 06/11/18 0500  Weight: 47.2 kg 46.9 kg 45.5 kg    Examination:  General exam: NAD Respiratory system: CTA Cardiovascular system: S 1, S 2 RRR Gastrointestinal system: BS present, soft,  Central nervous system:alert  righ hand on splint  Extremities: no edema Data Reviewed: I have personally reviewed following labs and imaging studies  CBC: Recent Labs  Lab 06/09/18 1239 06/09/18 1837 06/10/18 0652  WBC 7.3 8.4 7.5  NEUTROABS 5.2  --   --   HGB 9.8* 9.0* 8.8*  HCT 32.8* 29.7* 29.6*  MCV 99.1 97.7 97.7  PLT 255 254 811   Basic Metabolic Panel: Recent Labs  Lab 06/09/18 1036 06/09/18 1837 06/10/18 0652 06/11/18 0755   NA 140  --  142 141  K 5.7*  --  4.8 4.7  CL 105  --  111 109  CO2 25  --  21* 22  GLUCOSE 75  --  73 113*  BUN 68*  --  60* 52*  CREATININE 5.41* 5.04* 4.43* 4.24*  CALCIUM 8.7*  --  8.2* 8.5*  MG  --  3.3*  --  3.8*  PHOS  --  4.7*  --   --    GFR: Estimated Creatinine Clearance: 8.9 mL/min (A) (by C-G formula based on SCr of 4.24 mg/dL (H)). Liver Function Tests: Recent Labs  Lab 06/10/18 0652  AST 16  ALT 15  ALKPHOS 71  BILITOT 0.7  PROT 7.1  ALBUMIN 3.6   No results for input(s): LIPASE, AMYLASE in the last 168 hours. No results for input(s): AMMONIA in the last 168 hours. Coagulation Profile: No results for input(s): INR, PROTIME in the last 168 hours. Cardiac Enzymes: Recent Labs  Lab 06/09/18 1837 06/10/18 0007 06/10/18 0652  CKTOTAL 135  --   --   TROPONINI <0.03 <0.03 <0.03   BNP (last 3 results) No results for input(s): PROBNP in the last 8760 hours. HbA1C: Recent Labs    06/10/18 0652  HGBA1C 6.4*   CBG: Recent Labs  Lab 06/11/18 1637 06/11/18 2052 06/12/18 0550 06/12/18 0758 06/12/18 1210  GLUCAP 98 83 124* 104* 221*   Lipid Profile: No results for input(s): CHOL, HDL, LDLCALC, TRIG, CHOLHDL, LDLDIRECT in the last 72 hours. Thyroid Function Tests: Recent Labs    06/09/18 1837  TSH 2.185   Anemia Panel: No results for input(s): VITAMINB12, FOLATE, FERRITIN, TIBC, IRON, RETICCTPCT in the last 72 hours. Sepsis Labs: No results for input(s): PROCALCITON, LATICACIDVEN in the last 168 hours.  Recent Results (from the past 240 hour(s))  Urine culture     Status: None   Collection Time: 06/09/18  2:02 PM  Result Value Ref Range Status   Specimen Description   Final    URINE, CLEAN CATCH Performed at [facility], [place] [address] [place], [place] [address]    Special Requests   Final    NONE Performed at [facility], [place] [address] [place], [place] [address]    Culture   Final    NO  GROWTH Performed at [facility], [place] [address] [place],  [address]    Report Status 06/10/2018 FINAL  Final  MRSA PCR Screening     Status: Abnormal   Collection Time: 06/11/18  9:30 AM  Result Value Ref Range Status   MRSA by PCR POSITIVE (A) NEGATIVE Final    Comment:        The GeneXpert MRSA Assay (FDA approved for NASAL specimens only), is one component of  a comprehensive MRSA colonization surveillance program. It is not intended to diagnose MRSA infection nor to guide or monitor treatment for MRSA infections. RESULT CALLED TO, READ BACK BY AND VERIFIED WITH: Cherly Anderson [id number] @ 1607 BY J SCOTTON Performed at [place] [address], [place], [place] [address]   Culture, sputum-assessment     Status: None   Collection Time: 06/11/18  5:15 PM  Result Value Ref Range Status   Specimen Description EXPECTORATED SPUTUM  Final   Special Requests NONE  Final   Sputum evaluation   Final    THIS SPECIMEN IS ACCEPTABLE FOR SPUTUM CULTURE Performed at [facility], [place] [address], [place], [place] [address]    Report Status 06/11/2018 FINAL  Final  Culture, respiratory     Status: None (Preliminary result)   Collection Time: 06/11/18  5:15 PM  Result Value Ref Range Status   Specimen Description EXPECTORATED SPUTUM  Final   Special Requests   Final    NONE Reflexed from [id number] Performed at [place] [address] [place], [place] [address]    Gram Stain   Final    MODERATE WBC PRESENT, PREDOMINANTLY PMN ABUNDANT GRAM POSITIVE COCCI RARE GRAM POSITIVE RODS    Culture PENDING  Incomplete   Report Status PENDING  Incomplete         Radiology Studies: No results found.      Scheduled Meds: . amLODipine  10 mg Oral Daily  . aspirin EC  81 mg Oral Daily  . atorvastatin  40 mg Oral Daily  . Chlorhexidine Gluconate Cloth  6 each Topical Q0600  . famotidine  20 mg Oral Daily  .  gabapentin  100 mg Oral BID  . heparin  5,000 Units Subcutaneous Q8H  . insulin aspart  0-5 Units Subcutaneous QHS  . insulin aspart  0-9 Units Subcutaneous TID WC  . insulin glargine  24 Units Subcutaneous Daily  . metoprolol tartrate  100 mg Oral BID  . multivitamin with minerals  1 tablet Oral Daily  . mupirocin ointment  1 application Nasal BID  . pantoprazole  40 mg Oral Daily  . polyethylene glycol  17 g Oral Daily  . sodium chloride flush  3 mL Intravenous Q12H   Continuous Infusions:    LOS: 1 day    Time spent: 35 minutes.     Elmarie Shiley, MD [facility] Pager [phone]  If 7PM-7AM, please contact night-coverage [email or web address] Password [facility] 06/12/2018, 4:09 PM

## 2018-06-13 LAB — CBC
HCT: 29.9 % — ABNORMAL LOW (ref 36.0–46.0)
Hemoglobin: 9.2 g/dL — ABNORMAL LOW (ref 12.0–15.0)
MCH: 29.9 pg (ref 26.0–34.0)
MCHC: 30.8 g/dL (ref 30.0–36.0)
MCV: 97.1 fL (ref 80.0–100.0)
Platelets: 338 10*3/uL (ref 150–400)
RBC: 3.08 MIL/uL — ABNORMAL LOW (ref 3.87–5.11)
RDW: 12.8 % (ref 11.5–15.5)
WBC: 8.5 10*3/uL (ref 4.0–10.5)
nRBC: 0 % (ref 0.0–0.2)

## 2018-06-13 LAB — BASIC METABOLIC PANEL
Anion gap: 11 (ref 5–15)
BUN: 61 mg/dL — ABNORMAL HIGH (ref 6–20)
CO2: 22 mmol/L (ref 22–32)
Calcium: 8.7 mg/dL — ABNORMAL LOW (ref 8.9–10.3)
Chloride: 107 mmol/L (ref 98–111)
Creatinine, Ser: 4.72 mg/dL — ABNORMAL HIGH (ref 0.44–1.00)
GFR calc Af Amer: 11 mL/min — ABNORMAL LOW (ref >60)
GFR calc non Af Amer: 10 mL/min — ABNORMAL LOW (ref >60)
GLUCOSE: 94 mg/dL (ref 70–99)
Potassium: 5.3 mmol/L — ABNORMAL HIGH (ref 3.5–5.1)
Sodium: 140 mmol/L (ref 135–145)

## 2018-06-13 LAB — GLUCOSE, CAPILLARY
GLUCOSE-CAPILLARY: 141 mg/dL — AB (ref 70–99)
Glucose-Capillary: 72 mg/dL (ref 70–99)
Glucose-Capillary: 87 mg/dL (ref 70–99)
Glucose-Capillary: 80 mg/dL (ref 70–99)
Glucose-Capillary: 282 mg/dL — ABNORMAL HIGH (ref 70–99)
Glucose-Capillary: 204 mg/dL — ABNORMAL HIGH (ref 70–99)

## 2018-06-13 MED ORDER — SODIUM CHLORIDE 0.9 % IV SOLN
INTRAVENOUS | Status: DC
  Administered 2018-06-13: 15:00:00 via INTRAVENOUS

## 2018-06-13 MED ORDER — SODIUM ZIRCONIUM CYCLOSILICATE 5 G PO PACK
5.0000 g | PACK | Freq: Once | ORAL | Status: AC
  Administered 2018-06-13: 5 g via ORAL
  Filled 2018-06-13: qty 1

## 2018-06-13 MED ORDER — BOOST / RESOURCE BREEZE PO LIQD CUSTOM
1.0000 | Freq: Two times a day (BID) | ORAL | Status: DC
  Administered 2018-06-14 – 2018-06-15 (×3): 1 via ORAL

## 2018-06-13 NOTE — Progress Notes (Signed)
PROGRESS NOTE    [name]  ZDG:[id number] DOB: Mar 20, 1961 DOA: 06/09/2018 PCP: Charlott Rakes, MD   Brief Narrative: CAMIYA Michael is a 57 y.o. female with medical history significant of hypertension, hyperlipidemia, diabetes mellitus type 2, history of stroke and TIA, history of chronic diastolic CHF, CKD stage V, and multiple other comorbidities who presents from her ALF after a fall.  Is unclear how the patient fell and she does not know how and she remembers is woke up on the floor.  States that she blacked out and states that she was walking in the next minute she knows she is on the floor.  Immediately after she fell to the floor she fell on her wrist and with complaints of pain.  She denies any chest pain, lightheadedness or dizziness.   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 Keep patient on splint  PT , OT>  Required SNF , awaiting placement  Syncope; suspect hypovolemia, dehydration. Orthostatic hypotension.  ECHO normal EF Orthostatic vitals positive.  Received IV fluids.   Hyperkalemia; will give a dose of lokelma.   DM;  Continue with lantus and SSI.   HTN; hold hydralazine due to orthostatic hypotension.  On norvasc.   HO TIA; continue  with aspirin.   Anemia of chronic diseases.  Hb stable.   Chornic Diastolic HF; compensated. Hold lasix due to renal failure.   AKI on CKD stage 5; Improved. Hold lasix. IV fluids  Resume IV fluids for few hours.  Repeat Bmet   RN Pressure Injury Documentation:    Malnutrition Type:      Malnutrition Characteristics:      Nutrition Interventions:     Estimated body mass index is 23.83 kg/m as calculated from the following:   Height as of this encounter: 4\' 7"  (1.397 m).   Weight as of this encounter: 46.5 kg.   DVT prophylaxis: heparin  Code Status: Full code Family Communication: care discussed with patient.  Disposition Plan:    Consultants:   none   Procedures:   none  Antimicrobials:  none  Subjective: Denies abdominal pain. No new complaint.    Objective: Vitals:   06/13/18 0030 06/13/18 0305 06/13/18 0559 06/13/18 0956  BP:  (!) 142/69 139/75 (!) 153/78  Pulse:  74 69 74  Resp:  12 12 12   Temp: 98.9 F (37.2 C) 99 F (37.2 C) 98.6 F (37 C) 97.9 F (36.6 C)  TempSrc: Oral Oral Oral Oral  SpO2:  97% 94% 99%  Weight:   46.5 kg   Height:        Intake/Output Summary (Last 24 hours) at 06/13/2018 1424 Last data filed at 06/13/2018 0959 Gross per 24 hour  Intake 1560 ml  Output 425 ml  Net 1135 ml   Filed Weights   06/10/18 0540 06/11/18 0500 06/13/18 0559  Weight: 46.9 kg 45.5 kg 46.5 kg    Examination:  General exam: NAD Respiratory system: CTA Cardiovascular system: S 1, S 2 RRR Gastrointestinal system: BS present, NT Central nervous system:alert  righ hand on splint  Extremities: no edema  Data Reviewed: I have personally reviewed following labs and imaging studies  CBC: Recent Labs  Lab 06/09/18 1239 06/09/18 1837 06/10/18 0652 06/13/18 0504  WBC 7.3 8.4 7.5 8.5  NEUTROABS 5.2  --   --   --   HGB 9.8* 9.0* 8.8* 9.2*  HCT 32.8* 29.7* 29.6* 29.9*  MCV 99.1 97.7 97.7 97.1  PLT 255 254 233 859   Basic  Metabolic Panel: Recent Labs  Lab 06/09/18 1036 06/09/18 1837 06/10/18 0652 06/11/18 0755 06/13/18 0826  NA 140  --  142 141 140  K 5.7*  --  4.8 4.7 5.3*  CL 105  --  111 109 107  CO2 25  --  21* 22 22  GLUCOSE 75  --  73 113* 94  BUN 68*  --  60* 52* 61*  CREATININE 5.41* 5.04* 4.43* 4.24* 4.72*  CALCIUM 8.7*  --  8.2* 8.5* 8.7*  MG  --  3.3*  --  3.8*  --   PHOS  --  4.7*  --   --   --    GFR: Estimated Creatinine Clearance: 8.1 mL/min (A) (by C-G formula based on SCr of 4.72 mg/dL (H)). Liver Function Tests: Recent Labs  Lab 06/10/18 0652  AST 16  ALT 15  ALKPHOS 71  BILITOT 0.7  PROT 7.1  ALBUMIN 3.6   No results for input(s): LIPASE, AMYLASE in the last 168 hours. No results for input(s): AMMONIA in the last 168 hours. Coagulation Profile: No results for input(s): INR, PROTIME in the last 168 hours. Cardiac Enzymes: Recent Labs  Lab 06/09/18 1837 06/10/18 0007 06/10/18 0652  CKTOTAL 135  --   --   TROPONINI <0.03 <0.03 <0.03   BNP (last 3 results) No results for input(s): PROBNP in the last 8760 hours. HbA1C: No results for input(s): HGBA1C in the last 72 hours. CBG: Recent Labs  Lab 06/12/18 1827 06/12/18 2156 06/13/18 0557 06/13/18 0735 06/13/18 1206  GLUCAP 95 168* 87 80 282*   Lipid Profile: No results for input(s): CHOL, HDL, LDLCALC, TRIG, CHOLHDL, LDLDIRECT in the last 72 hours. Thyroid Function Tests: No results for input(s): TSH, T4TOTAL, FREET4, T3FREE, THYROIDAB in the last 72 hours. Anemia Panel: No results for input(s): VITAMINB12, FOLATE, FERRITIN, TIBC, IRON, RETICCTPCT in the last 72 hours. Sepsis Labs: No results for input(s): PROCALCITON, LATICACIDVEN in the last 168 hours.  Recent Results (from the past 240 hour(s))  Urine culture     Status: None   Collection Time: 06/09/18  2:02 PM  Result Value Ref Range Status   Specimen Description   Final    URINE, CLEAN CATCH Performed at [facility], [place]  [address] [place], [place] [address]    Special Requests   Final    NONE Performed at [facility], [place] [address], [place], [place] [address]    Culture   Final    NO GROWTH Performed at [facility], [place] [address], [place], [place] [address]    Report Status 06/10/2018 FINAL  Final  MRSA PCR Screening     Status: Abnormal   Collection Time: 06/11/18  9:30 AM  Result Value Ref Range Status   MRSA by PCR POSITIVE (A) NEGATIVE Final    Comment:        The GeneXpert MRSA Assay (FDA approved for NASAL specimens only), is one component of a comprehensive MRSA colonization surveillance program. It is not intended to diagnose MRSA infection nor to guide or monitor treatment for MRSA infections. RESULT CALLED TO, READ BACK BY AND VERIFIED WITH: Cherly Anderson [id number] @ 7893 BY J SCOTTON Performed at [place] [address], [place], [place] [address]   Culture, sputum-assessment     Status: None   Collection Time: 06/11/18  5:15 PM  Result Value Ref Range Status   Specimen Description EXPECTORATED SPUTUM  Final   Special Requests NONE  Final   Sputum evaluation   Final    THIS SPECIMEN IS ACCEPTABLE FOR SPUTUM CULTURE Performed at [facility], [place] [address], [place], [place] [address]    Report Status 06/11/2018 FINAL  Final  Culture, respiratory     Status: None (Preliminary result)   Collection Time: 06/11/18  5:15 PM  Result Value Ref Range Status   Specimen Description   Final    EXPECTORATED SPUTUM Performed at [place] [address] [place], [place] [address]    Special Requests   Final    NONE Reflexed from [id number] Performed at [facility], [place] [address] [place], [place] [address]    Gram Stain   Final    MODERATE WBC PRESENT, PREDOMINANTLY PMN ABUNDANT GRAM POSITIVE COCCI RARE GRAM POSITIVE RODS    Culture   Final    FEW Consistent with normal  respiratory flora. Performed at [facility], [place] [address] [place], [place] [address]    Report Status PENDING  Incomplete         Radiology Studies: No results found.      Scheduled Meds: . amLODipine  10 mg Oral Daily  . aspirin EC  81 mg Oral Daily  . atorvastatin  40 mg Oral Daily  . Chlorhexidine Gluconate Cloth  6 each Topical Q0600  . famotidine  20 mg Oral Daily  . gabapentin  100 mg Oral BID  . heparin  5,000 Units Subcutaneous Q8H  . insulin aspart  0-5 Units Subcutaneous QHS  . insulin aspart  0-9 Units Subcutaneous TID WC  . insulin glargine  24 Units Subcutaneous Daily  . metoprolol tartrate  100 mg Oral BID  . multivitamin with minerals  1 tablet Oral Daily  . mupirocin ointment  1 application Nasal BID  . pantoprazole  40 mg Oral Daily  . polyethylene glycol  17 g Oral Daily  . sodium chloride flush  3 mL Intravenous Q12H   Continuous Infusions: . sodium chloride       LOS: 2 days    Time spent: 35 minutes.     Elmarie Shiley, MD [facility] Pager [phone]  If 7PM-7AM, please contact night-coverage [email or web address] Password [facility] 06/13/2018, 2:24 PM

## 2018-06-14 LAB — BASIC METABOLIC PANEL
Anion gap: 12 (ref 5–15)
BUN: 59 mg/dL — ABNORMAL HIGH (ref 6–20)
CO2: 22 mmol/L (ref 22–32)
Calcium: 8.5 mg/dL — ABNORMAL LOW (ref 8.9–10.3)
Chloride: 106 mmol/L (ref 98–111)
Creatinine, Ser: 4.63 mg/dL — ABNORMAL HIGH (ref 0.44–1.00)
GFR calc Af Amer: 11 mL/min — ABNORMAL LOW (ref >60)
GFR calc non Af Amer: 10 mL/min — ABNORMAL LOW (ref >60)
Glucose, Bld: 114 mg/dL — ABNORMAL HIGH (ref 70–99)
Potassium: 5.4 mmol/L — ABNORMAL HIGH (ref 3.5–5.1)
SODIUM: 140 mmol/L (ref 135–145)

## 2018-06-14 LAB — CULTURE, RESPIRATORY W GRAM STAIN: Culture: NORMAL

## 2018-06-14 LAB — GLUCOSE, CAPILLARY
GLUCOSE-CAPILLARY: 96 mg/dL (ref 70–99)
Glucose-Capillary: 105 mg/dL — ABNORMAL HIGH (ref 70–99)
Glucose-Capillary: 377 mg/dL — ABNORMAL HIGH (ref 70–99)
Glucose-Capillary: 113 mg/dL — ABNORMAL HIGH (ref 70–99)
Glucose-Capillary: 74 mg/dL (ref 70–99)
Glucose-Capillary: 229 mg/dL — ABNORMAL HIGH (ref 70–99)

## 2018-06-14 LAB — CULTURE, RESPIRATORY

## 2018-06-14 MED ORDER — SODIUM POLYSTYRENE SULFONATE 15 GM/60ML PO SUSP
30.0000 g | Freq: Once | ORAL | Status: AC
  Administered 2018-06-14: 30 g via ORAL
  Filled 2018-06-14: qty 120

## 2018-06-14 NOTE — Progress Notes (Signed)
Physical Therapy Treatment Patient Details Name: [name] Michael MRN: [id number] DOB: 1961/05/03 Today's Date: 06/14/2018    History of Present Illness 57 YO female admitted to ED for fall, ? syncope, R radius/ulnar fx   Pt with medical diagnosis of lower UTI. PMH includes poorly controlled DM II, HLD, HTN, CVA of L frontal region in 2011 and another in 2018, TIAs, AKI, CAP, diabetic retinopathy, depression, HLD, CKD stage 5, pt reporting blindness. Past surgical history includes ORIF L distal radius fracture 2017.    PT Comments    Pt tolerated increased ambulation distance of 110' holding IV pole, no loss of balance. Pt reported dizziness initially upon sitting, but with second attempt of supine to sit she denied dizziness. She also denied dizziness in standing.    Follow Up Recommendations  Home health PT;SNF(needs assist with all ADL's and mobility if ALF can provide that is pt preference )     Equipment Recommendations  None recommended by PT(pt is unable to safely/correctly use a platform walker )    Recommendations for Other Services       Precautions / Restrictions Precautions Precautions: Fall Precaution Comments: orthostatic Required Braces or Orthoses: Splint/Cast;Sling Restrictions Weight Bearing Restrictions: No RUE Weight Bearing: Non weight bearing    Mobility  Bed Mobility Overal bed mobility: Needs Assistance Bed Mobility: Supine to Sit     Supine to sit: Min assist;HOB elevated     General bed mobility comments: mod I for supine to sit, but pt had sudden descent to supine due to "feeling dizzy" in sitting, min A to control descent  Transfers Overall transfer level: Needs assistance Equipment used: 1 person hand held assist Transfers: Sit to/from Stand Sit to Stand: Min guard Stand pivot transfers: Min guard       General transfer comment: no physical assist to rise, min/guard for safety  Ambulation/Gait Ambulation/Gait assistance: Min  guard Gait Distance (Feet): 110 Feet Assistive device: IV Pole Gait Pattern/deviations: Step-to pattern;Drifts right/left Gait velocity: decreased    General Gait Details: steady with ambulation, no loss of balance   Stairs             Wheelchair Mobility    Modified Rankin (Stroke Patients Only)       Balance Overall balance assessment: Needs assistance Sitting-balance support: No upper extremity supported Sitting balance-Leahy Scale: Good     Standing balance support: Single extremity supported Standing balance-Leahy Scale: Fair                              Cognition Arousal/Alertness: Awake/alert Behavior During Therapy: WFL for tasks assessed/performed Overall Cognitive Status: Within Functional Limits for tasks assessed                                        Exercises      General Comments        Pertinent Vitals/Pain Pain Assessment: No/denies pain    Home Living                      Prior Function            PT Goals (current goals can now be found in the care plan section) Acute Rehab PT Goals Patient Stated Goal: none stated PT Goal Formulation: With patient Time For Goal Achievement: 06/24/18 Potential to Achieve Goals:  Good Progress towards PT goals: Progressing toward goals    Frequency    Min 2X/week      PT Plan Current plan remains appropriate    Co-evaluation              AM-PAC PT "6 Clicks" Mobility   Outcome Measure  Help needed turning from your back to your side while in a flat bed without using bedrails?: A Little Help needed moving from lying on your back to sitting on the side of a flat bed without using bedrails?: A Little Help needed moving to and from a bed to a chair (including a wheelchair)?: A Little Help needed standing up from a chair using your arms (e.g., wheelchair or bedside chair)?: A Little Help needed to walk in hospital room?: A Little Help needed  climbing 3-5 steps with a railing? : A Lot 6 Click Score: 17    End of Session Equipment Utilized During Treatment: Gait belt Activity Tolerance: Patient limited by fatigue Patient left: in chair;with call bell/phone within reach;with chair alarm set Nurse Communication: Mobility status;Other (comment)(orthostatic) PT Visit Diagnosis: Other abnormalities of gait and mobility (R26.89);History of falling (Z91.81)     Time: 4695-0722 PT Time Calculation (min) (ACUTE ONLY): 16 min  Charges:  $Gait Training: 8-22 mins                     Blondell Reveal Kistler PT 06/14/2018  Acute Rehabilitation Services Pager [phone] Office [phone]

## 2018-06-14 NOTE — Progress Notes (Signed)
CSW assisting with the patient discharge. Per [place], the patient does not have insurance at this time (Facility has allowed the patient to stay). The patient cannot admit to SNF without a payor source and does not have SNF bed offers at this time.   Per PT notes, the patient will benefit from [facility]; SNF. The patient had increased ambulation distance of 110' holding IV pole, no loss of balance. CSW will have ALF revaluate the patient.   CSW will continue to follow.   Kathrin Greathouse, Marlinda Mike, MSW Clinical Social Worker  [phone] 06/14/2018  5:00 PM

## 2018-06-14 NOTE — Progress Notes (Signed)
PROGRESS NOTE    [name] Michael  POE:[id number] DOB: 1960-10-19 DOA: 06/09/2018 PCP: Charlott Rakes, MD   Brief Narrative: [name] Michael is a 57 y.o. female with medical history significant of hypertension, hyperlipidemia, diabetes mellitus type 2, history of stroke and TIA, history of chronic diastolic CHF, CKD stage V, and multiple other comorbidities who presents from her ALF after a fall.  Is unclear how the patient fell and she does not know how and she remembers is woke up on the floor.  States that she blacked out and states that she was walking in the next minute she knows she is on the floor.  Immediately after she fell to the floor she fell on her wrist and with complaints of pain.  She denies any chest pain, lightheadedness or dizziness.   Patient admitted after syncope thought to be related to orthostatic hypotension.  Hydralazine was to stop.  Her Lasix has been on hold.  He was also found to have right wrist fracture.  Dr. Grandville Silos recommended a splint and follow-up as an outpatient.  She presented with acute on chronic renal failure likely from hypovolemia.  She received IV fluids.  Lasix has been on hold.  She has hyperkalemia today.  She will receive a dose of kayexalate.   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 Keep patient on splint  PT , OT>  Required SNF , awaiting placement.  Syncope; suspect hypovolemia, dehydration. Orthostatic hypotension.  ECHO normal EF Orthostatic vitals positive.  Received IV fluids.   Hyperkalemia; Received a  dose of lokelma. K still elevated to day. Will give kayexalate.  Repeat labs in am.   DM;  Continue with lantus and SSI.   HTN; hold hydralazine due to orthostatic hypotension.  On norvasc.   HO TIA; continue with aspirin.   Anemia of chronic diseases.  Hb stable.   [place] Diastolic HF; compensated. Hold lasix due to renal failure.   AKI on CKD stage 5;cr baseline 4.  Improved. Hold lasix. IV fluids  Stable.       Estimated body mass index is 24.34 kg/m as calculated from the following:   Height as of this encounter: 4\' 7"  (1.397 m).   Weight as of this encounter: 47.5 kg.   DVT prophylaxis: heparin  Code Status: Full code Family Communication: care discussed with patient.  Disposition Plan:    Consultants:   none   Procedures:   none  Antimicrobials:  none  Subjective: No new complaints.   Objective: Vitals:   06/14/18 0149 06/14/18 0533 06/14/18 0542 06/14/18 1253  BP: (!) 148/68 (!) 155/73  (!) 148/75  Pulse: 80 80  79  Resp: 12 14  19   Temp: 98.8 F (37.1 C) 98.1 F (36.7  C)  99.1 F (37.3 C)  TempSrc: Oral Oral  Oral  SpO2: 93% 93%  93%  Weight:   47.5 kg   Height:        Intake/Output Summary (Last 24 hours) at 06/14/2018 1404 Last data filed at 06/14/2018 1254 Gross per 24 hour  Intake 1320.04 ml  Output 550 ml  Net 770.04 ml   Filed Weights   06/11/18 0500 06/13/18 0559 06/14/18 0542  Weight: 45.5 kg 46.5 kg 47.5 kg    Examination:  General exam: NAD Respiratory system: CTA Cardiovascular system: S 1, S 2 RRR Gastrointestinal system: BS present, soft, nt Central nervous system:alert  righ hand on splint  Extremities: no  edema  Data Reviewed: I have personally reviewed following labs and imaging studies  CBC: Recent Labs  Lab 06/09/18 1239 06/09/18 1837 06/10/18 0652 06/13/18 0504  WBC 7.3 8.4 7.5 8.5  NEUTROABS 5.2  --   --   --   HGB 9.8* 9.0* 8.8* 9.2*  HCT 32.8* 29.7* 29.6* 29.9*  MCV 99.1 97.7 97.7 97.1  PLT 255 254 233 725   Basic Metabolic Panel: Recent Labs  Lab 06/09/18 1036 06/09/18 1837 06/10/18 0652 06/11/18 0755 06/13/18 0826 06/14/18 0412  NA 140  --  142 141 140 140  K 5.7*  --  4.8 4.7 5.3* 5.4*  CL 105  --  111 109 107 106  CO2 25  --  21* 22 22 22   GLUCOSE 75  --  73 113* 94 114*  BUN 68*  --  60* 52* 61* 59*  CREATININE 5.41* 5.04* 4.43* 4.24* 4.72* 4.63*  CALCIUM 8.7*  --  8.2* 8.5* 8.7* 8.5*  MG  --  3.3*  --  3.8*  --   --   PHOS  --  4.7*  --   --   --   --    GFR: Estimated Creatinine Clearance: 8.3 mL/min (A) (by C-G formula based on SCr of 4.63 mg/dL (H)). Liver Function Tests: Recent Labs  Lab 06/10/18 0652  AST 16  ALT 15  ALKPHOS 71  BILITOT 0.7  PROT 7.1  ALBUMIN 3.6   No results for input(s): LIPASE, AMYLASE in the last 168 hours. No results for input(s): AMMONIA in the last 168 hours. Coagulation Profile: No results for input(s): INR, PROTIME in the last 168 hours. Cardiac Enzymes: Recent Labs  Lab 06/09/18 1837 06/10/18 0007 06/10/18 0652  CKTOTAL 135  --   --   TROPONINI <0.03 <0.03 <0.03   BNP (last 3 results) No results for input(s): PROBNP in the last 8760 hours. HbA1C: No results for input(s): HGBA1C in the last 72 hours. CBG: Recent Labs  Lab 06/13/18 1646 06/13/18 2310 06/14/18 0531 06/14/18 0753 06/14/18 1121  GLUCAP 141* 204* 96 105* 377*   Lipid Profile: No results for input(s): CHOL, HDL, LDLCALC, TRIG, CHOLHDL, LDLDIRECT in the last 72 hours. Thyroid Function Tests: No results for input(s): TSH, T4TOTAL, FREET4, T3FREE, THYROIDAB in the last 72 hours. Anemia Panel: No results for input(s): VITAMINB12,  FOLATE, FERRITIN, TIBC, IRON, RETICCTPCT in the last 72 hours. Sepsis Labs: No results for input(s): PROCALCITON, LATICACIDVEN in the last 168 hours.  Recent Results (from the past 240 hour(s))  Urine culture     Status: None   Collection Time: 06/09/18  2:02 PM  Result Value Ref Range Status   Specimen Description   Final    URINE, CLEAN CATCH Performed at [facility], [place] [address]  [place], [place] [address]    Special Requests   Final    NONE Performed at [facility], [place] [address], [place], [place] [address]    Culture   Final    NO GROWTH Performed at [facility], [place] [address] [place], [place] [address]    Report Status 06/10/2018 FINAL  Final  MRSA PCR Screening     Status: Abnormal   Collection Time: 06/11/18  9:30 AM  Result Value Ref Range Status   MRSA by PCR POSITIVE (A) NEGATIVE Final    Comment:        The GeneXpert MRSA Assay (FDA approved for NASAL specimens only), is one component of a comprehensive MRSA colonization surveillance program. It is not intended to diagnose MRSA infection nor to guide or monitor treatment for MRSA infections. RESULT CALLED TO, READ BACK BY AND VERIFIED WITH: Cherly Anderson [id number] @ 2458 BY J SCOTTON Performed at [facility] [address], [place], [place] [address]   Culture, sputum-assessment     Status: None   Collection Time: 06/11/18  5:15 PM  Result Value Ref Range Status   Specimen Description EXPECTORATED SPUTUM  Final   Special Requests NONE  Final   Sputum evaluation   Final    THIS SPECIMEN IS ACCEPTABLE FOR SPUTUM CULTURE Performed at [facility], [place] [address], [place], [place] [address]    Report Status 06/11/2018 FINAL  Final  Culture, respiratory     Status: None   Collection Time: 06/11/18  5:15 PM  Result Value Ref Range Status   Specimen Description   Final    EXPECTORATED SPUTUM Performed at [facility] [address], [place], [place] [address]    Special Requests   Final    NONE Reflexed from [id number] Performed at [facility], [place] [address] [place], [place] [address]    Gram Stain   Final    MODERATE WBC PRESENT, PREDOMINANTLY PMN ABUNDANT GRAM POSITIVE COCCI RARE GRAM POSITIVE RODS    Culture   Final    FEW Consistent with normal respiratory flora. Performed at [facility], [place] [address] [place], [place] [address]    Report Status 06/14/2018 FINAL  Final         Radiology Studies: No results found.      Scheduled Meds: . amLODipine  10 mg Oral Daily  . aspirin EC  81 mg Oral Daily  . atorvastatin  40 mg Oral Daily  . Chlorhexidine Gluconate Cloth  6 each Topical Q0600  . famotidine  20 mg Oral Daily  . feeding supplement  1 Container Oral BID BM  . gabapentin  100 mg Oral BID  . heparin  5,000 Units Subcutaneous Q8H  . insulin aspart  0-5 Units Subcutaneous QHS  . insulin aspart  0-9 Units Subcutaneous TID WC  . insulin glargine  24 Units Subcutaneous Daily  . metoprolol tartrate  100 mg Oral BID  . multivitamin with minerals  1 tablet Oral Daily  . mupirocin ointment  1 application Nasal BID  . pantoprazole  40 mg Oral Daily  . polyethylene glycol  17 g Oral Daily  . sodium chloride flush  3 mL Intravenous Q12H   Continuous Infusions:    LOS: 3 days    Time spent: 35 minutes.     Elmarie Shiley, MD [facility] Pager [phone]  If 7PM-7AM, please contact night-coverage [email or web address] Password [facility] 06/14/2018,  2:04 PM

## 2018-06-15 LAB — BASIC METABOLIC PANEL
Anion gap: 14 (ref 5–15)
BUN: 57 mg/dL — AB (ref 6–20)
CO2: 22 mmol/L (ref 22–32)
Calcium: 8.8 mg/dL — ABNORMAL LOW (ref 8.9–10.3)
Chloride: 106 mmol/L (ref 98–111)
Creatinine, Ser: 4.12 mg/dL — ABNORMAL HIGH (ref 0.44–1.00)
GFR calc Af Amer: 13 mL/min — ABNORMAL LOW (ref >60)
GFR calc non Af Amer: 11 mL/min — ABNORMAL LOW (ref >60)
GLUCOSE: 97 mg/dL (ref 70–99)
Potassium: 4.2 mmol/L (ref 3.5–5.1)
Sodium: 142 mmol/L (ref 135–145)

## 2018-06-15 LAB — CBC
HCT: 29.6 % — ABNORMAL LOW (ref 36.0–46.0)
Hemoglobin: 9 g/dL — ABNORMAL LOW (ref 12.0–15.0)
MCH: 28.8 pg (ref 26.0–34.0)
MCHC: 30.4 g/dL (ref 30.0–36.0)
MCV: 94.9 fL (ref 80.0–100.0)
PLATELETS: 397 10*3/uL (ref 150–400)
RBC: 3.12 MIL/uL — ABNORMAL LOW (ref 3.87–5.11)
RDW: 12.4 % (ref 11.5–15.5)
WBC: 9.2 10*3/uL (ref 4.0–10.5)
nRBC: 0 % (ref 0.0–0.2)

## 2018-06-15 LAB — GLUCOSE, CAPILLARY
Glucose-Capillary: 83 mg/dL (ref 70–99)
Glucose-Capillary: 156 mg/dL — ABNORMAL HIGH (ref 70–99)

## 2018-06-15 NOTE — Care Management Note (Signed)
Case Management Note  Patient Details  Name: [name] Michael MRN: [id number] Date of Birth: 06-27-1960  Subjective/Objective:                    Action/Plan:Pt will have HHPT at ALF, will need orders please.    Expected Discharge Date:  06/15/18               Expected Discharge Plan:  Assisted Living / Rest Home  In-House Referral:     Discharge planning Services  CM Consult  Post Acute Care Choice:    Choice offered to:  Patient  DME Arranged:    DME Agency:     HH Arranged:  PT HH Agency:  [facility]  Status of Service:  Completed, signed off  If discussed at [place] of Stay Meetings, dates discussed:    Additional CommentsPurcell Mouton, RN 06/15/2018, 12:33 PM

## 2018-06-15 NOTE — Progress Notes (Signed)
Occupational Therapy Treatment and Discharge Patient Details Name: [name] Michael MRN: [id number] DOB: 09-06-1960 Today's Date: 06/15/2018    History of present illness 57 YO female admitted to ED for fall, ? syncope, R radius/ulnar fx   Pt with medical diagnosis of lower UTI. PMH includes poorly controlled DM II, HLD, HTN, CVA of L frontal region in 2011 and another in 2018, TIAs, AKI, CAP, diabetic retinopathy, depression, HLD, CKD stage 5, pt reporting blindness. Past surgical history includes ORIF L distal radius fracture 2017.   OT comments  This 57 yo female admitted with above presents to acute OT making progress with basic ADLs and will continue to progress as long as she has therapy (not sure she will get this due to no insurance). Made her aware she needs to wear her sling when up and about but can have it off when she is laying or sitting still but needs to prop her arm when in these positions. She has good movement of digits within constraints of casting.  Follow Up Recommendations  SNF;Other (comment)(but pt does not have insurance so she is going back to ALF)    Equipment Recommendations  3 in 1 bedside commode       Precautions / Restrictions Precautions Precautions: Fall Precaution Comments: no c/o fo dizziness today with change in position Required Braces or Orthoses: Splint/Cast;Sling Restrictions Weight Bearing Restrictions: Yes RUE Weight Bearing: Non weight bearing Other Position/Activity Restrictions: NWB R wrist but can WB thru elbow to use a platform walker, per Ortho  MD       Mobility Bed Mobility Overal bed mobility: Needs Assistance Bed Mobility: Supine to Sit     Supine to sit: Min guard;HOB elevated(use of rail)        Transfers Overall transfer level: Needs assistance   Transfers: Sit to/from Stand;Stand Pivot Transfers Sit to Stand: Min guard              Balance Overall balance assessment: Needs assistance Sitting-balance support:  No upper extremity supported;Feet supported Sitting balance-Leahy Scale: Good     Standing balance support: Single extremity supported Standing balance-Leahy Scale: Poor                             ADL either performed or assessed with clinical judgement   ADL Overall ADL's : Needs assistance/impaired Eating/Feeding: Set up;Sitting   Grooming: Minimal assistance;Sitting           Upper Body Dressing : Moderate assistance;Sitting Upper Body Dressing Details (indicate cue type and reason): with increased time and cues Lower Body Dressing: Maximal assistance Lower Body Dressing Details (indicate cue type and reason): with increased time and cues; min A sit<>stand from bed Toilet Transfer: Min guard;Stand-pivot;BSC   Toileting- Clothing Manipulation and Hygiene: Maximal assistance Toileting - Clothing Manipulation Details (indicate cue type and reason): min guard A sit<>stand             Vision Patient Visual Report: (low vision)            Cognition Arousal/Alertness: Awake/alert Behavior During Therapy: WFL for tasks assessed/performed Overall Cognitive Status: Within Functional Limits for tasks assessed  Pertinent Vitals/ Pain       Pain Assessment: No/denies pain         Frequency  Min 2X/week        Progress Toward Goals  OT Goals(current goals can now be found in the care plan section)  Progress towards OT goals: Progressing toward goals     Plan Discharge plan remains appropriate       AM-PAC OT "6 Clicks" Daily Activity     Outcome Measure   Help from another person eating meals?: None Help from another person taking care of personal grooming?: A Little Help from another person toileting, which includes using toliet, bedpan, or urinal?: A Lot Help from another person bathing (including washing, rinsing, drying)?: A Lot Help from another person to put on  and taking off regular upper body clothing?: A Lot Help from another person to put on and taking off regular lower body clothing?: A Lot 6 Click Score: 15    End of Session Equipment Utilized During Treatment: (sling RUE)  OT Visit Diagnosis: Unsteadiness on feet (R26.81)   Activity Tolerance Patient tolerated treatment well   Patient Left in chair;with call bell/phone within reach;with chair alarm set   Nurse Communication          Time: 7017-7939 OT Time Calculation (min): 36 min  Charges: OT General Charges $OT Visit: 1 Visit OT Treatments $Self Care/Home Management : 23-37 mins  [place] [address], OTR/L Acute [employer] Pager [phone] Office [phone]      Almon Register 06/15/2018, 1:46 PM

## 2018-06-15 NOTE — Progress Notes (Signed)
Reviewed discharge information with patient . Answered all questions. . Patient verbalizes importance of PCP follow up appointment. Gave report to [place] at [place].   Barbee [place]. Brigitte Pulse, RN

## 2018-06-15 NOTE — Discharge Summary (Signed)
Physician Discharge Summary  [name] Michael VXB:[id number] DOB: February 24, 1961 DOA: 06/09/2018  PCP: Charlott Rakes, MD  Admit date: 06/09/2018 Discharge date: 06/15/2018  Admitted From: ALF Disposition:  ALF  Recommendations for Outpatient Follow-up:  1. Follow up with PCP in 1-2 weeks 2. Please obtain BMP/CBC in one week 3. Needs to follow up with Dr Grandville Silos for wrist fracture, his office will arrange follow up. 4. Please monitor volume status. Resume lasix as needed.  Home Health: yes  Discharge Condition: Stable CODE STATUS: Full Diet recommendation: Heart Healthy / Carb Modified  Brief/Interim Summary: [name] L Nanceis a 57 y.o.femalewith medical history significant ofhypertension, hyperlipidemia, diabetes mellitus type 2, history of stroke and TIA, history of chronic diastolic CHF, CKD stage V, and multiple other comorbidities who presents from her ALF after a fall. Is unclear how the patient fell and she does not know how and she remembers is woke up on the floor. States that she blacked out and states that she was walking in the next minute she knows she is on the floor. Immediately after she fell to the floor she fell on her wrist and with complaints of pain. She denies any chest pain, lightheadedness or dizziness.  1-Fall;with resultantfracture of the distal radius and ulna with impaction of the distal radial fracture site Discussed with Dr Grandville Silos, continue with splint, patient to follow in the office PT, OT evaluated patient. per Dr Grandville Silos ok to use platform walker for ambulation.  On splint.  Initial plan for the patient to be discharged to skilled nursing facility.  However, per social worker no payer source. The patient in fact did well with therapy and will be discharged back to the assisted living facility.  Syncope;suspect hypovolemia, dehydration.  Echo normal ef Orthostatic vitals positive.  Resolved with IV fluids.  Holding hydralazine lasix at  discharge. -Monitor blood pressure at the facility and adjust medications as needed.   Hyperkalemia;resolved.   DM;  Continue with lantus and SSI.   HTN; hold hydralazine due to orthostatic hypotension.  continue with norvasc. Monitor blood pressure at the facility and adjust medications as needed.  HO TIA; continue with aspirin.   Anemia of chronic diseases.  Hb stable.   Chornic Diastolic HF; compensated. Hold lasix due to renal failure.  -Advised to follow-up outpatient to be evaluated in the clinic and resume Lasix as indicated.  AKI on CKD stage 5; Improved. Hold lasix. IV fluids monitor volume status out patient.  Resume lasix as needed.  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  Answered all of their questions appropriately to the best of my knowledge.  They verbalized the understanding and the patient is being discharged in a stable condition to the facility.  Discharge Instructions    Diet - low sodium heart  healthy   Complete by:  As directed    Increase activity slowly   Complete by:  As directed      Allergies as of 06/15/2018      Reactions   Hydrocodone Nausea And Vomiting      Medication List    STOP taking these medications   calcium  carbonate 500 MG chewable tablet Commonly known as:  TUMS - dosed in mg elemental calcium   cephALEXin 250 MG capsule Commonly known as:  KEFLEX   furosemide 20 MG tablet Commonly known as:  LASIX   hydrALAZINE 25 MG tablet Commonly known as:  APRESOLINE   magnesium oxide 400 MG tablet Commonly known as:  MAG-OX     TAKE these medications   acetaminophen 325 MG tablet Commonly known as:  TYLENOL Take 2 tablets (650 mg total) by mouth every 4 (four) hours as needed. What changed:    when to take this  reasons to take this   amLODipine 10 MG tablet Commonly known as:  NORVASC Take 10 mg by mouth daily.   aspirin 81 MG tablet Take 1 tablet (81 mg total) by mouth daily.   atorvastatin 40 MG tablet Commonly known as:  LIPITOR Take 40 mg by mouth daily.   CERTAVITE SENIOR/ANTIOXIDANT Tabs Take 1 tablet by mouth daily.   diphenhydrAMINE 25 MG tablet Commonly known as:  BENADRYL Take 25 mg by mouth every 4 (four) hours as needed for itching.   gabapentin 100 MG capsule Commonly known as:  NEURONTIN Take 100 mg by mouth 2 (two) times daily.   insulin aspart 100 UNIT/ML injection Commonly known as:  novoLOG Inject 8 Units into the skin 3 (three) times daily with meals.   insulin glargine 100 UNIT/ML injection Commonly known as:  LANTUS Inject 0.24 mLs (24 Units total) into the skin daily. What changed:  how much to take   lidocaine 5 % Commonly known as:  LIDODERM Place 1 patch onto the skin daily. Remove & Discard patch within 12 hours or as directed by MD   metoprolol tartrate 100 MG tablet Commonly known as:  LOPRESSOR Take 100 mg by mouth 2 (two) times daily.   omeprazole 40 MG capsule Commonly known as:  PRILOSEC Take 40 mg by mouth daily.   polyethylene glycol packet Commonly known as:  MIRALAX / GLYCOLAX Take 17 g by mouth daily.   PROBIOTIC PO Take 1 tablet by mouth 2 (two) times daily.   ranitidine 150 MG tablet Commonly known as:   ZANTAC Take 1 tablet (150 mg total) by mouth 2 (two) times daily.   senna-docusate 8.6-50 MG tablet Commonly known as:  Senokot-S Take 1 tablet by mouth at bedtime as needed for mild constipation.   traMADol 50 MG tablet Commonly known as:  ULTRAM Take 0.5 tablets (25 mg total) by mouth every 12 (twelve) hours as needed.   Vitamin D (Ergocalciferol) 1.25 MG (50000 UT) Caps capsule Commonly known as:  DRISDOL Take 1 capsule (50,000 Units total) by mouth every 7 (seven) days.      Follow-up Information    Milly Jakob, MD Follow up.   Specialty:  Orthopedic Surgery Why:  office will call you to arrange the next appt for week of 12-30 Contact information: [address] [place] [address] [phone]          Allergies  Allergen Reactions  . Hydrocodone Nausea And Vomiting    Consultations:  Orthopedics   Procedures/Studies: X-ray Chest Pa And Lateral  Result Date: 06/10/2018 CLINICAL DATA:  Cough, shortness of breath, and weakness. EXAM: CHEST - 2 VIEW COMPARISON:  06/09/2018 FINDINGS: The cardiac silhouette remains enlarged, and there is persistent anterior eventration of the right hemidiaphragm. Image quality is degraded on the lateral radiograph due to the patient's inability to raise the right arm. No airspace consolidation, edema, pleural effusion, or pneumothorax is identified. No acute osseous abnormality is seen. IMPRESSION: No active cardiopulmonary disease. Electronically Signed   By: Logan Bores M.D.   On: 06/10/2018 10:11   Dg Chest 2 View  Result Date: 06/09/2018 CLINICAL DATA:  Cough and weakness EXAM: CHEST - 2 VIEW COMPARISON:  03/10/2018 FINDINGS: Cardiac shadow is enlarged but stable. The lungs are well aerated bilaterally. No focal infiltrate or sizable effusion is seen. No acute bony abnormality is noted. IMPRESSION: No acute abnormality noted. Electronically Signed   By: Inez Catalina M.D.   On: 06/09/2018 14:37   Dg Wrist Complete  Right  Result Date: 06/09/2018 CLINICAL DATA:  Pain following fall EXAM: RIGHT WRIST - COMPLETE 3+ VIEW COMPARISON:  None. FINDINGS: Frontal, oblique, lateral, and ulnar deviation scaphoid images were obtained. There is a fracture at the distal radial metaphysis-diaphysis junction with impaction. There is slight dorsal angulation distally. There is in addition a fracture of the distal ulnar metaphysis-diaphysis junction with overall alignment near anatomic. No other fractures are evident. No dislocations. 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 No erosive changes. Bones are osteoporotic. IMPRESSION: Fractures of the distal radius and ulna with impaction at the distal radial fracture site. Bones osteoporotic. No dislocation. 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 Electronically Signed   By: Lowella Grip III M.D.   On: 06/09/2018 09:57   Ct Head Wo Contrast  Result Date: 06/09/2018 CLINICAL DATA:  Increasing falls. EXAM: CT HEAD WITHOUT CONTRAST CT CERVICAL SPINE WITHOUT CONTRAST TECHNIQUE: Multidetector CT imaging of the head and cervical spine was performed following the standard protocol without intravenous contrast. Multiplanar CT image reconstructions of the cervical spine were also generated. COMPARISON:  03/10/2018 FINDINGS: CT HEAD FINDINGS Brain: Chronic small-vessel ischemic changes of the cerebral hemispheric white matter. Old infarction in the left basal ganglia and radiating white matter tracts. No sign of acute infarction, mass lesion, hemorrhage, hydrocephalus or extra-axial collection. Vascular: There is atherosclerotic calcification of the major vessels at the base of the brain. Skull: No skull fracture. Sinuses/Orbits: Clear/normal Other: None CT CERVICAL SPINE FINDINGS Alignment: No traumatic malalignment. Skull base and vertebrae: No fracture or focal bone lesion. Soft tissues and spinal canal: Normal Disc levels: No significant disc  degeneration. No canal or foraminal stenosis. No significant facet arthropathy. Upper chest: Negative Other: Known right parotid mass only partially visualized. IMPRESSION: Head CT: No acute or traumatic finding. Chronic small-vessel ischemic changes. 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 Cervical spine CT: Negative Electronically Signed   By: Nelson Chimes M.D.   On: 06/09/2018 16:13   Ct Cervical Spine Wo Contrast  Result Date: 06/09/2018 CLINICAL DATA:  Increasing falls. EXAM: CT HEAD WITHOUT CONTRAST CT CERVICAL SPINE WITHOUT CONTRAST TECHNIQUE: Multidetector CT imaging of the head and cervical spine was performed following the standard protocol without intravenous contrast. Multiplanar CT image reconstructions of the cervical spine were also generated. COMPARISON:  03/10/2018 FINDINGS: CT HEAD FINDINGS Brain: Chronic small-vessel ischemic changes of the cerebral hemispheric white matter. Old infarction in the left basal ganglia and radiating white matter tracts. No sign of acute infarction, mass lesion, hemorrhage, hydrocephalus or extra-axial collection. Vascular:  There is atherosclerotic calcification of the major vessels at the base of the brain. Skull: No skull fracture. Sinuses/Orbits: Clear/normal Other: None CT CERVICAL SPINE FINDINGS Alignment: No traumatic malalignment. Skull base and vertebrae: No fracture or focal bone lesion. Soft tissues and spinal canal: Normal Disc levels: No significant disc degeneration. No canal or foraminal stenosis. No significant facet arthropathy. Upper chest: Negative Other: Known right parotid mass only partially visualized. IMPRESSION: Head CT: No acute or traumatic finding. Chronic small-vessel ischemic changes. 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 Cervical spine CT: Negative Electronically Signed   By: Nelson Chimes M.D.   On: 06/09/2018 16:13       Subjective: She denies having any major complaints at this time.  Discharge Exam: Vitals:   06/15/18 0547  06/15/18 0921  BP: (!) 154/69 (!) 156/83  Pulse: 75 75  Resp: 18 15  Temp: 99.1 F (37.3 C) 98.1 F (36.7 C)  SpO2: 97% 96%   Vitals:   06/14/18 2145 06/15/18 0500 06/15/18 0547 06/15/18 0921  BP: (!) 150/74  (!) 154/69 (!) 156/83  Pulse: 75  75 75  Resp: 16  18 15   Temp: 98.7 F (37.1 C)  99.1 F (37.3 C) 98.1 F (36.7 C)  TempSrc: Oral  Oral Oral  SpO2: 92%  97% 96%  Weight:  46.5 kg    Height:        General: Pt is alert, awake, not in acute distress Cardiovascular: RRR, S1/S2 +, no rubs, no gallops Respiratory: CTA bilaterally, no wheezing, no rhonchi Abdominal: Soft, NT, ND, bowel sounds + Extremities: no edema, right arm splint    The results of significant diagnostics from this hospitalization (including imaging, microbiology, ancillary and laboratory) are listed below for reference.     Microbiology: Recent Results (from the past 240 hour(s))  Urine culture     Status: None   Collection Time: 06/09/18  2:02 PM  Result Value Ref Range Status   Specimen Description   Final    URINE, CLEAN CATCH Performed at [facility], [place] [address], [place], [place] [address]    Special Requests   Final    NONE Performed at [facility], [place] [address] [place], [place] [address]    Culture   Final    NO GROWTH Performed at [facility], [place] [address] [place], [place] [address]    Report Status 06/10/2018 FINAL  Final  MRSA PCR Screening     Status: Abnormal   Collection Time: 06/11/18  9:30 AM  Result Value Ref Range Status   MRSA by PCR POSITIVE (A) NEGATIVE Final    Comment:        The GeneXpert MRSA Assay (FDA approved for NASAL specimens only), is one component of a comprehensive MRSA colonization surveillance program. It is not intended to diagnose MRSA infection nor to guide or monitor treatment for MRSA infections. RESULT CALLED TO, READ BACK BY AND VERIFIED WITH: Cherly Anderson [id number] @ 6195 BY J  SCOTTON Performed at [place] [address] [place], [place] [address]   Culture, sputum-assessment     Status: None   Collection Time: 06/11/18  5:15 PM  Result Value Ref Range Status   Specimen Description EXPECTORATED SPUTUM  Final   Special Requests NONE  Final   Sputum evaluation   Final    THIS SPECIMEN IS ACCEPTABLE FOR SPUTUM CULTURE Performed at [facility], [place] [address], [place], [place] [address]  Report Status 06/11/2018 FINAL  Final  Culture, respiratory     Status: None   Collection Time: 06/11/18  5:15 PM  Result Value Ref Range Status   Specimen Description   Final    EXPECTORATED SPUTUM Performed at [facility] [address], [place], [place] [address]    Special Requests   Final    NONE Reflexed from [id number] Performed at [facility], [place] [address] [place], [place] [address]    Gram Stain   Final    MODERATE WBC PRESENT, PREDOMINANTLY PMN ABUNDANT GRAM POSITIVE COCCI RARE GRAM POSITIVE RODS    Culture   Final    FEW Consistent with normal respiratory flora. Performed at [facility], [place] [address] [place], [place] [address]    Report Status 06/14/2018 FINAL  Final     Labs: BNP (last 3 results) No results for input(s): BNP in the last 8760 hours. Basic Metabolic Panel: Recent Labs  Lab 06/09/18 1837 06/10/18 9562 06/11/18 0755 06/13/18 0826 06/14/18 0412 06/15/18 0411  NA  --  142 141 140 140 142  K  --  4.8 4.7 5.3* 5.4* 4.2  CL  --  111 109 107 106 106  CO2  --  21* 22 22 22 22   GLUCOSE  --  73 113* 94 114* 97  BUN  --  60* 52* 61* 59* 57*  CREATININE 5.04* 4.43* 4.24* 4.72* 4.63* 4.12*  CALCIUM  --  8.2* 8.5* 8.7* 8.5* 8.8*  MG 3.3*  --  3.8*  --   --   --   PHOS 4.7*  --   --   --   --   --    Liver Function Tests: Recent Labs  Lab 06/10/18 0652  AST 16  ALT 15  ALKPHOS 71  BILITOT 0.7  PROT 7.1  ALBUMIN 3.6   No results for  input(s): LIPASE, AMYLASE in the last 168 hours. No results for input(s): AMMONIA in the last 168 hours. CBC: Recent Labs  Lab 06/09/18 1239 06/09/18 1837 06/10/18 0652 06/13/18 0504 06/15/18 0411  WBC 7.3 8.4 7.5 8.5 9.2  NEUTROABS 5.2  --   --   --   --   HGB 9.8* 9.0* 8.8* 9.2* 9.0*  HCT 32.8* 29.7* 29.6* 29.9* 29.6*  MCV 99.1 97.7 97.7 97.1 94.9  PLT 255 254 233 338 397   Cardiac Enzymes: Recent Labs  Lab 06/09/18 1837 06/10/18 0007 06/10/18 0652  CKTOTAL 135  --   --   TROPONINI <0.03 <0.03 <0.03   BNP: Invalid input(s): POCBNP CBG: Recent Labs  Lab 06/14/18 1121 06/14/18 1642 06/14/18 1842 06/14/18 2148 06/15/18 0422  GLUCAP 377* 113* 74 229* 83   D-Dimer No results for input(s): DDIMER in the last 72 hours. Hgb A1c No results for input(s): HGBA1C in the last 72 hours. Lipid Profile No results for input(s): CHOL, HDL, LDLCALC, TRIG, CHOLHDL, LDLDIRECT in the last 72 hours. Thyroid function studies No results for input(s): TSH, T4TOTAL, T3FREE, THYROIDAB in the last 72 hours.  Invalid input(s): FREET3 Anemia work up No results for input(s): VITAMINB12, FOLATE, FERRITIN, TIBC, IRON, RETICCTPCT in the last 72 hours. Urinalysis    Component Value Date/Time   COLORURINE YELLOW 06/09/2018 1402   APPEARANCEUR CLEAR 06/09/2018 1402   LABSPEC 1.009 06/09/2018 1402   PHURINE 7.0 06/09/2018 1402   GLUCOSEU NEGATIVE 06/09/2018 1402   HGBUR NEGATIVE 06/09/2018 1402   BILIRUBINUR NEGATIVE 06/09/2018 1402   BILIRUBINUR  n 05/25/2012 1202   KETONESUR NEGATIVE 06/09/2018 1402   PROTEINUR 100 (A) 06/09/2018 1402   UROBILINOGEN 0.2 05/25/2012 1202   UROBILINOGEN 0.2 03/29/2011 0131   NITRITE NEGATIVE 06/09/2018 1402   LEUKOCYTESUR NEGATIVE 06/09/2018 1402   Sepsis Labs Invalid input(s): PROCALCITONIN,  WBC,  LACTICIDVEN Microbiology Recent Results (from the past 240 hour(s))  Urine culture     Status: None   Collection Time: 06/09/18  2:02 PM  Result Value  Ref Range Status   Specimen Description   Final    URINE, CLEAN CATCH Performed at [facility], [place] [address], [place], [place] [address]    Special Requests   Final    NONE Performed at [facility], [place] [address], [place], [place] [address]    Culture   Final    NO GROWTH Performed at [facility], [place] [address] [place], [place] [address]    Report Status 06/10/2018 FINAL  Final  MRSA PCR Screening     Status: Abnormal   Collection Time: 06/11/18  9:30 AM  Result Value Ref Range Status   MRSA by PCR POSITIVE (A) NEGATIVE Final    Comment:        The GeneXpert MRSA Assay (FDA approved for NASAL specimens only), is one component of a comprehensive MRSA colonization surveillance program. It is not intended to diagnose MRSA infection nor to guide or monitor treatment for MRSA infections. RESULT CALLED TO, READ BACK BY AND VERIFIED WITH: Cherly Anderson [id number] @ 9562 BY J SCOTTON Performed at [place] [address] [place], [place] [address]   Culture, sputum-assessment     Status: None   Collection Time: 06/11/18  5:15 PM  Result Value Ref Range Status   Specimen Description EXPECTORATED SPUTUM  Final   Special Requests NONE  Final   Sputum evaluation   Final    THIS SPECIMEN IS ACCEPTABLE FOR SPUTUM CULTURE Performed at [facility], [place] [address] [place], [place] [address]    Report Status 06/11/2018 FINAL  Final  Culture, respiratory     Status: None   Collection Time: 06/11/18  5:15 PM  Result Value Ref Range Status   Specimen Description   Final    EXPECTORATED SPUTUM Performed at [facility] [address] [place], [place] [address]    Special Requests   Final    NONE Reflexed from [id number] Performed at [facility], [place] [address] [place],  [address]    Gram Stain   Final    MODERATE WBC PRESENT, PREDOMINANTLY PMN ABUNDANT  GRAM POSITIVE COCCI RARE GRAM POSITIVE RODS    Culture   Final    FEW Consistent with normal respiratory flora. Performed at [facility], [place] [address], [place],  [address]    Report Status 06/14/2018 FINAL  Final     Time coordinating discharge: Over 30 minutes  SIGNED:   Yaakov Guthrie, MD  [facility] 06/15/2018, 11:31 AM Pager on [place]  If 7PM-7AM, please contact night-coverage [email or web address] Password [email or web address]

## 2018-06-15 NOTE — Progress Notes (Signed)
Patient discharge back to [facility]. Patient clinical information was reviewed by the resident supervisor Wells Guiles.  The patient can return. PTAR arranged for 1:30 pick up Nurse given the number to call report.   Kathrin Greathouse, Marlinda Mike, MSW Clinical Social Worker  [phone] 06/15/2018  12:25 PM

## 2018-08-31 ENCOUNTER — Ambulatory Visit (HOSPITAL_COMMUNITY)
Admission: EM | Admit: 2018-08-31 | Discharge: 2018-08-31 | Disposition: A | Discharge status: Home / Self Care | Payer: Self-pay | Attending: Family Medicine | Admitting: Family Medicine

## 2018-08-31 ENCOUNTER — Encounter (HOSPITAL_COMMUNITY): Payer: Self-pay | Admitting: Emergency Medicine

## 2018-08-31 ENCOUNTER — Ambulatory Visit (INDEPENDENT_AMBULATORY_CARE_PROVIDER_SITE_OTHER): Payer: Self-pay

## 2018-08-31 DIAGNOSIS — S52531A Colles' fracture of right radius, initial encounter for closed fracture: Secondary | ICD-10-CM

## 2018-08-31 DIAGNOSIS — S52501A Unspecified fracture of the lower end of right radius, initial encounter for closed fracture: Secondary | ICD-10-CM

## 2018-08-31 DIAGNOSIS — S52601A Unspecified fracture of lower end of right ulna, initial encounter for closed fracture: Secondary | ICD-10-CM

## 2018-08-31 NOTE — ED Triage Notes (Signed)
Pt here to have her cast removed, states she lives in an assisted living facility and they told her she needs it removed. Pt did not follow up with Dr. Grandville Silos after her hospital stay in December.

## 2018-08-31 NOTE — ED Provider Notes (Signed)
[place]   [id number] 08/31/18 Arrival Time: 6948  ASSESSMENT & PLAN:  1. Closed fracture distal radius and ulna, right, initial encounter    I have personally viewed the imaging studies ordered this visit. Healed distal radius and ulna fractures.  Imaging: Dg Wrist Complete Right  Result Date: 08/31/2018 CLINICAL DATA:  Distal right radial fracture. EXAM: RIGHT WRIST - COMPLETE 3+ VIEW COMPARISON:  Radiographs of June 09, 2018. FINDINGS: Sclerosis is seen involving the distal right radius and ulna consistent with healed fractures. Callus formation is noted without persistent fracture line. No new fracture or dislocation is noted. IMPRESSION: Healed distal right radial and ulnar fractures. Electronically Signed   By: Marijo Conception, M.D.   On: 08/31/2018 12:06    Encouraged her to schedule f/u with: Follow-up Information    Schedule an appointment as soon as possible for a visit  with Milly Jakob, MD.   Specialty:  Orthopedic Surgery Contact information: [place] [phone]         May need occupational therapy. Discussed.  OTC analgesics if needed.  Reviewed expectations re: course of current medical issues. Questions answered. Outlined signs and symptoms indicating need for more acute intervention. Patient verbalized understanding. After Visit Summary given.  SUBJECTIVE: History from: patient. [name] Michael is a 58 y.o. female who suffered R distal radius/ulna fractures in 05/2018. Admitted to hospital for other medical issues. Splint applied. Was supposed to f/u with Dr. Nobie Putnam but never scheduled f/u. Presents today for removal of splint. No significant pain. No extremity sensation changes or weakness.  Past Surgical History:  Procedure Laterality Date  . OPEN REDUCTION INTERNAL FIXATION (ORIF) DISTAL RADIAL FRACTURE Left 01/28/2016   Procedure: OPEN REDUCTION INTERNAL FIXATION (ORIF) DISTAL RADIAL FRACTURE;   Surgeon: Iran Planas, MD;  Location: [place];  Service: Orthopedics;  Laterality: Left;  . [place]     age 29     ROS: As per HPI. All other systems negative.   OBJECTIVE:  Vitals:   08/31/18 1107  BP: 131/61  Pulse: 72  Resp: 16  Temp: (!) 97.4 F (36.3 C)  SpO2: 98%    General appearance: alert; no distress Extremities: . RUE: warm and well perfused; very dry/scaly skin; overall normal wrist ROM; minimal muscle atrophy when compared to [name]; no specific wrist tenderness to palpation; without gross deformities; with no swelling; with no bruising CV: brisk extremity capillary refill of RUE; 2+ radial pulse of RUE. Skin: warm and dry; no visible rashes Neurologic: gait normal; normal reflexes of RUE and [name]; normal sensation of RUE and [name]; decreased strength of RUE when compared to [name] Psychological: alert and cooperative; normal mood and affect  Allergies  Allergen Reactions  . Hydrocodone Nausea And Vomiting    Past Medical History:  Diagnosis Date  . Diabetes mellitus   . Hyperlipidemia   . Hypertension   . Stroke [facility]) 04-01-11   left frontal subcortical, saw Dr. [name]   . TIA (transient ischemic attack) 03-12-11   Social History   Socioeconomic History  . Marital status: Single    Spouse name: Not on file  . Number of children: Not on file  . Years of education: Not on file  . Highest education level: Not on file  Occupational History  . Not on file  Social Needs  . Financial resource strain: Not on file  . Food insecurity:  Worry: Not on file    Inability: Not on file  . Transportation needs:    Medical: Not on file    Non-medical: Not on file  Tobacco Use  . Smoking status: Current Every Day Smoker    Packs/day: 1.00    Years: 32.00    Pack years: 32.00    Types: Cigarettes    Last attempt to quit: 09/23/2012    Years since quitting: 5.9  . Smokeless tobacco: Never Used  .  Tobacco comment: or less  Substance and Sexual Activity  . Alcohol use: No  . Drug use: No  . Sexual activity: Never  Lifestyle  . Physical activity:    Days per week: Not on file    Minutes per session: Not on file  . Stress: Not on file  Relationships  . Social connections:    Talks on phone: Not on file    Gets together: Not on file    Attends religious service: Not on file    Active member of club or organization: Not on file    Attends meetings of clubs or organizations: Not on file    Relationship status: Not on file  Other Topics Concern  . Not on file  Social History Narrative   Lives with brother and sister-in-law. Does not work.         Family History  Problem Relation Age of Onset  . Stroke Mother   . Diabetes Mother   . Kidney failure Mother   . Heart failure Mother   . Stroke Father   . Cancer Sister        Breast- 17's   Past Surgical History:  Procedure Laterality Date  . OPEN REDUCTION INTERNAL FIXATION (ORIF) DISTAL RADIAL FRACTURE Left 01/28/2016   Procedure: OPEN REDUCTION INTERNAL FIXATION (ORIF) DISTAL RADIAL FRACTURE;  Surgeon: Iran Planas, MD;  Location: [place];  Service: Orthopedics;  Laterality: Left;  . [place]     age 89      Vanessa Kick, MD 08/31/18 [phone]

## 2018-10-12 ENCOUNTER — Other Ambulatory Visit: Payer: Self-pay

## 2018-10-12 ENCOUNTER — Emergency Department (HOSPITAL_COMMUNITY)
Admission: EM | Admit: 2018-10-12 | Discharge: 2018-10-12 | Disposition: A | Discharge status: Home / Self Care | Payer: Self-pay | Attending: Emergency Medicine | Admitting: Emergency Medicine

## 2018-10-12 DIAGNOSIS — E1122 Type 2 diabetes mellitus with diabetic chronic kidney disease: Secondary | ICD-10-CM | POA: Insufficient documentation

## 2018-10-12 DIAGNOSIS — I132 Hypertensive heart and chronic kidney disease with heart failure and with stage 5 chronic kidney disease, or end stage renal disease: Secondary | ICD-10-CM | POA: Insufficient documentation

## 2018-10-12 DIAGNOSIS — N185 Chronic kidney disease, stage 5: Secondary | ICD-10-CM | POA: Insufficient documentation

## 2018-10-12 DIAGNOSIS — Z794 Long term (current) use of insulin: Secondary | ICD-10-CM | POA: Insufficient documentation

## 2018-10-12 DIAGNOSIS — R899 Unspecified abnormal finding in specimens from other organs, systems and tissues: Secondary | ICD-10-CM

## 2018-10-12 DIAGNOSIS — Z8673 Personal history of transient ischemic attack (TIA), and cerebral infarction without residual deficits: Secondary | ICD-10-CM | POA: Insufficient documentation

## 2018-10-12 DIAGNOSIS — R7989 Other specified abnormal findings of blood chemistry: Secondary | ICD-10-CM | POA: Insufficient documentation

## 2018-10-12 DIAGNOSIS — Z79899 Other long term (current) drug therapy: Secondary | ICD-10-CM | POA: Insufficient documentation

## 2018-10-12 DIAGNOSIS — I5032 Chronic diastolic (congestive) heart failure: Secondary | ICD-10-CM | POA: Insufficient documentation

## 2018-10-12 LAB — BASIC METABOLIC PANEL
Anion gap: 11 (ref 5–15)
BUN: 70 mg/dL — ABNORMAL HIGH (ref 6–20)
CO2: 20 mmol/L — ABNORMAL LOW (ref 22–32)
Calcium: 9.1 mg/dL (ref 8.9–10.3)
Chloride: 106 mmol/L (ref 98–111)
Creatinine, Ser: 4.5 mg/dL — ABNORMAL HIGH (ref 0.44–1.00)
GFR calc Af Amer: 12 mL/min — ABNORMAL LOW (ref >60)
GFR calc non Af Amer: 10 mL/min — ABNORMAL LOW (ref >60)
Glucose, Bld: 77 mg/dL (ref 70–99)
Potassium: 5.1 mmol/L (ref 3.5–5.1)
Sodium: 137 mmol/L (ref 135–145)

## 2018-10-12 LAB — CBC WITH DIFFERENTIAL/PLATELET
Abs Immature Granulocytes: 0.05 10*3/uL (ref 0.00–0.07)
Basophils Absolute: 0 10*3/uL (ref 0.0–0.1)
Basophils Relative: 0 %
Eosinophils Absolute: 0.3 10*3/uL (ref 0.0–0.5)
Eosinophils Relative: 4 %
HCT: 28.6 % — ABNORMAL LOW (ref 36.0–46.0)
Hemoglobin: 9 g/dL — ABNORMAL LOW (ref 12.0–15.0)
Immature Granulocytes: 1 %
Lymphocytes Relative: 19 %
Lymphs Abs: 1.7 10*3/uL (ref 0.7–4.0)
MCH: 29.1 pg (ref 26.0–34.0)
MCHC: 31.5 g/dL (ref 30.0–36.0)
MCV: 92.6 fL (ref 80.0–100.0)
Monocytes Absolute: 0.8 10*3/uL (ref 0.1–1.0)
Monocytes Relative: 9 %
Neutro Abs: 6.1 10*3/uL (ref 1.7–7.7)
Neutrophils Relative %: 67 %
Platelets: 317 10*3/uL (ref 150–400)
RBC: 3.09 MIL/uL — ABNORMAL LOW (ref 3.87–5.11)
RDW: 13 % (ref 11.5–15.5)
WBC: 9 10*3/uL (ref 4.0–10.5)
nRBC: 0 % (ref 0.0–0.2)

## 2018-10-12 NOTE — ED Triage Notes (Addendum)
Pt presents to the ED by PTAR. Pt's labwork showed a potassium level of 6.3. Pt is coming from [facility]. Pt denying any pain at present time.

## 2018-10-12 NOTE — Discharge Instructions (Addendum)
Please read the attached information.  As we discussed your lab results showed that your potassium is elevated, our results here show that is within normal range.  Your creatinine is within your normal range, I would follow-up with your primary care provider this week for repeat laboratory analysis for ongoing evaluation and management of this.

## 2018-10-12 NOTE — ED Provider Notes (Signed)
[place] EMERGENCY DEPARTMENT Provider Note   CSN: [id number] Arrival date & time: 10/12/18  1341    History   Chief Complaint Chief Complaint  Patient presents with   Abnormal Lab    HPI [name] Michael is a 58 y.o. female.     HPI   58 year old female with a past medical history of diabetes, hyperlipidemia, hypertension, stroke, CHF, CKD stage V presents today with complaints of elevated potassium.  Notes she lives at [facility].  She was told today that her potassium is high (6.4) and that she would be going to the emergency room.  She denies any complaints presently including chest pain shortness of breath or any neurological deficits.  He is uncertain why the collected potassium on her.    Past Medical History:  Diagnosis Date   Diabetes mellitus    Hyperlipidemia    Hypertension    Stroke [facility]) 04-01-11   left frontal subcortical, saw Dr. [name]    TIA (transient ischemic attack) 03-12-11    Patient Active Problem List   Diagnosis Date Noted   Fall 06/09/2018   Fall at home, initial encounter 06/09/2018   Anemia of chronic disease 06/09/2018   Syncope and collapse 06/09/2018   CKD (chronic kidney disease) stage 5, GFR less than 15 ml/min ([place]) 03/10/2018   Acute encephalopathy 03/10/2018   Hypermagnesemia 03/10/2018   AKI (acute kidney injury) ([place]) 11/09/2016   Hypercalcemia 11/09/2016   Hyponatremia 11/09/2016   Hypovolemia 11/09/2016   Accelerated hypertension 11/09/2016   Acute lower UTI 11/09/2016   Nephrolithiasis 10/24/2016   Constipation 10/22/2016   CAP (community acquired pneumonia) 10/22/2016   Hydronephrosis of right kidney 55/73/2202   Metabolic acidosis 54/27/0623   CVA (cerebral vascular accident) ([place]) 07/03/2016   HAP (hospital-acquired pneumonia) 07/03/2016   Sepsis secondary to UTI [facility])    UTI due to extended-spectrum beta lactamase (ESBL) producing Escherichia coli    Acute  pyelonephritis    Bacteremia due to Escherichia coli    Colitis, indeterminate    Uncontrolled type 2 diabetes mellitus with complication ([place])    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 associated with diabetes mellitus due to underlying condition (HCC)    Chronic diastolic CHF (congestive heart failure) ([place])    Abdominal pain    Colitis    Hypophosphatemia 06/12/2016   Hypokalemia 06/11/2016   Hypocalcemia 06/11/2016   Acute kidney injury superimposed on chronic kidney disease ([place]) 06/11/2016   Proliferative diabetic retinopathy ([place]) 04/29/2016   Closed fracture of left distal radius 01/28/2016   Poor social situation 03/09/2013   Depression 03/01/2013   Abnormal mammogram 12/20/2012   Retinal detachment 11/17/2012   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 ([place]) 76/28/3151   Hyperlipidemia 02/09/2007   Essential hypertension 02/09/2007   GERD 02/09/2007    Past Surgical History:  Procedure Laterality Date   OPEN REDUCTION INTERNAL FIXATION (ORIF) DISTAL RADIAL FRACTURE Left 01/28/2016   Procedure: OPEN REDUCTION INTERNAL FIXATION (ORIF) DISTAL RADIAL FRACTURE;  Surgeon: Iran Planas, MD;  Location: [place];  Service: Orthopedics;  Laterality: Left;   [place]   TONSILLECTOMY AND ADENOIDECTOMY     age 71     OB History   No obstetric history on file.      Home Medications    Prior to Admission medications   Medication Sig Start Date End Date Taking? Authorizing Provider  acetaminophen (TYLENOL) 325 MG tablet Take  2 tablets (650 mg total) by mouth every 4 (four) hours as needed. 06/11/18 06/11/19  Regalado, [name], MD  amLODipine (NORVASC) 10 MG tablet Take 10 mg by mouth daily.    [provider]  aspirin 81 MG tablet Take 1 tablet (81 mg total) by mouth daily. 08/24/12   Reyne Dumas, MD  atorvastatin (LIPITOR) 40 MG tablet Take 40 mg by mouth daily.     [provider]  diphenhydrAMINE (BENADRYL) 25 MG tablet Take 25 mg by mouth every 4 (four) hours as needed for itching.    [provider]  gabapentin (NEURONTIN) 100 MG capsule Take 100 mg by mouth 2 (two) times daily.    [provider]  insulin aspart (NOVOLOG) 100 UNIT/ML injection Inject 8 Units into the skin 3 (three) times daily with meals. 11/21/16   Ghimire, Henreitta Leber, MD  insulin glargine (LANTUS) 100 UNIT/ML injection Inject 0.24 mLs (24 Units total) into the skin daily. 06/11/18   Regalado, [name], MD  lidocaine (LIDODERM) 5 % Place 1 patch onto the skin daily. Remove & Discard patch within 12 hours or as directed by MD 11/21/16   Jonetta Osgood, MD  metoprolol (LOPRESSOR) 100 MG tablet Take 100 mg by mouth 2 (two) times daily.    [provider]  Multiple Vitamins-Minerals (CERTAVITE SENIOR/ANTIOXIDANT) TABS Take 1 tablet by mouth daily.    [provider]  omeprazole (PRILOSEC) 40 MG capsule Take 40 mg by mouth daily.    [provider]  polyethylene glycol (MIRALAX / GLYCOLAX) packet Take 17 g by mouth daily. 11/22/16   Ghimire, Henreitta Leber, MD  Probiotic Product (PROBIOTIC PO) Take 1 tablet by mouth 2 (two) times daily.     [provider]  ranitidine (ZANTAC) 150 MG tablet Take 1 tablet (150 mg total) by mouth 2 (two) times daily. 10/14/16   Street, Mercedes, PA-C  senna-docusate (SENOKOT-S) 8.6-50 MG tablet Take 1 tablet by mouth at bedtime as needed for mild constipation. 06/11/18   Regalado, [name], MD  traMADol (ULTRAM) 50 MG tablet Take 0.5 tablets (25 mg total) by mouth every 12 (twelve) hours as needed. 06/11/18 06/11/19  Regalado, Jerald Kief A, MD  Vitamin D, Ergocalciferol, (DRISDOL) 50000 units CAPS capsule Take 1 capsule (50,000 Units total) by mouth every 7 (seven) days. 11/21/16   Ghimire, Henreitta Leber, MD    Family History Family History  Problem Relation Age of Onset   Stroke Mother    Diabetes Mother     Kidney failure Mother    Heart failure Mother    Stroke Father    Cancer Sister        Breast- 96's    Social History Social History   Tobacco Use   Smoking status: Current Every Day Smoker    Packs/day: 1.00    Years: 32.00    Pack years: 32.00    Types: Cigarettes    Last attempt to quit: 09/23/2012    Years since quitting: 6.0   Smokeless tobacco: Never Used   Tobacco comment: or less  Substance Use Topics   Alcohol use: No   Drug use: No     Allergies   Hydrocodone   Review of Systems Review of Systems  All other systems reviewed and are negative.    Physical Exam Updated Vital Signs BP 118/61    Pulse 70    Temp 97.9 F (36.6 C) (Oral)    Resp 14    Ht 4\' 7"  (  1.397 m)    Wt 47.2 kg    LMP 03/06/2013    SpO2 97%    BMI 24.17 kg/m   Physical Exam Vitals signs and nursing note reviewed.  Constitutional:      Appearance: She is well-developed.  HENT:     Head: Normocephalic and atraumatic.  Eyes:     General: No scleral icterus.       Right eye: No discharge.        Left eye: No discharge.     Conjunctiva/sclera: Conjunctivae normal.     Pupils: Pupils are equal, round, and reactive to light.  Neck:     Musculoskeletal: Normal range of motion.     Vascular: No JVD.     Trachea: No tracheal deviation.  Cardiovascular:     Rate and Rhythm: Normal rate and regular rhythm.  Pulmonary:     Effort: Pulmonary effort is normal. No respiratory distress.     Breath sounds: Normal breath sounds. No stridor. No wheezing, rhonchi or rales.  Neurological:     Mental Status: She is alert and oriented to person, place, and time.     Coordination: Coordination normal.  Psychiatric:        Behavior: Behavior normal.        Thought Content: Thought content normal.        Judgment: Judgment normal.      ED Treatments / Results  Labs (all labs ordered are listed, but only abnormal results are displayed) Labs Reviewed  BASIC METABOLIC PANEL - Abnormal;  Notable for the following components:      Result Value   CO2 20 (*)    BUN 70 (*)    Creatinine, Ser 4.50 (*)    GFR calc non Af Amer 10 (*)    GFR calc Af Amer 12 (*)    All other components within normal limits  CBC WITH DIFFERENTIAL/PLATELET - Abnormal; Notable for the following components:   RBC 3.09 (*)    Hemoglobin 9.0 (*)    HCT 28.6 (*)    All other components within normal limits  CBC WITH DIFFERENTIAL/PLATELET    EKG EKG Interpretation  Date/Time:  Tuesday October 12 2018 14:17:48 EDT Ventricular Rate:  70 PR Interval:  140 QRS Duration: 82 QT Interval:  426 QTC Calculation: 460 R Axis:   87 Text Interpretation:  Normal sinus rhythm Normal ECG Confirmed by Lennice Sites [phone]) on 10/12/2018 2:25:20 PM   Radiology No results found.  Procedures Procedures (including critical care time)  Medications Ordered in ED Medications - No data to display   Initial Impression / Assessment and Plan / ED Course  I have reviewed the triage vital signs and the nursing notes.  Pertinent labs & imaging results that were available during my care of the patient were reviewed by me and considered in my medical decision making (see chart for details).        Labs: CBC, BMP  Imaging:  Consults:  Therapeutics:  Discharge Meds:   Assessment/Plan: 58 year old female presents today with elevated potassium.  Patient's potassium here shows within normal limits at 5.1.  Remainder labs are within her normal range with no significant deviations.  She is asymptomatic.  She is stable for outpatient primary follow-up.  Patient discharged at this time.      Final Clinical Impressions(s) / ED Diagnoses   Final diagnoses:  Elevated laboratory test result    ED Discharge Orders    None  Okey Regal, PA-C 10/12/18 Bre[place], [place], DO 10/12/18 1548

## 2018-10-12 NOTE — ED Notes (Signed)
Report given to PTAR. Discharge instructions and follow up information given to patient. Patient given the opportunity to ask questions. Pt verbalized understanding.

## 2018-10-12 NOTE — ED Notes (Signed)
Called ptar

## 2019-02-01 ENCOUNTER — Other Ambulatory Visit: Payer: Self-pay

## 2019-02-01 DIAGNOSIS — N185 Chronic kidney disease, stage 5: Secondary | ICD-10-CM

## 2019-02-08 ENCOUNTER — Other Ambulatory Visit: Payer: Self-pay

## 2019-02-08 ENCOUNTER — Ambulatory Visit (HOSPITAL_COMMUNITY)
Admission: RE | Admit: 2019-02-08 | Discharge: 2019-02-08 | Disposition: A | Discharge status: Home / Self Care | Payer: Medicare Other | Source: Ambulatory Visit | Attending: Vascular Surgery | Admitting: Vascular Surgery

## 2019-02-08 ENCOUNTER — Ambulatory Visit (INDEPENDENT_AMBULATORY_CARE_PROVIDER_SITE_OTHER): Payer: Self-pay | Admitting: Vascular Surgery

## 2019-02-08 ENCOUNTER — Ambulatory Visit (INDEPENDENT_AMBULATORY_CARE_PROVIDER_SITE_OTHER)
Admission: RE | Admit: 2019-02-08 | Discharge: 2019-02-08 | Disposition: A | Discharge status: Home / Self Care | Payer: Medicare Other | Source: Ambulatory Visit | Attending: Vascular Surgery | Admitting: Vascular Surgery

## 2019-02-08 ENCOUNTER — Encounter: Payer: Self-pay | Admitting: Vascular Surgery

## 2019-02-08 ENCOUNTER — Encounter: Payer: Self-pay | Admitting: *Deleted

## 2019-02-08 VITALS — BP 136/77 | HR 67 | Temp 98.0°F | Resp 12 | Ht <= 58 in | Wt 102.0 lb

## 2019-02-08 DIAGNOSIS — N185 Chronic kidney disease, stage 5: Secondary | ICD-10-CM

## 2019-02-08 NOTE — Progress Notes (Signed)
Patient name: [name] Michael MRN: [id number] DOB: 08/13/1960 Sex: female  REASON FOR CONSULT: New AV fistula access evaluation  HPI: [name] Michael is a 58 y.o. female, with history of hypertension, hyperlipidemia, diabetes, and stage V chronic kidney disease, as well as previous stroke that presents for new dialysis access evaluation.  Patient states she has never had any previous access before.  No previous tunneled catheters.  She is right-handed.  She does walk with a cane.  Currently living in a group home.  States she has broken both of her wrists in the past.  Otherwise no numbness tingling in her hands of either upper extremity.  Past Medical History:  Diagnosis Date  . Diabetes mellitus   . Hyperlipidemia   . Hypertension   . Stroke [facility]) 04-01-11   left frontal subcortical, saw Dr. [name]   . TIA (transient ischemic attack) 03-12-11    Past Surgical History:  Procedure Laterality Date  . OPEN REDUCTION INTERNAL FIXATION (ORIF) DISTAL RADIAL FRACTURE Left 01/28/2016   Procedure: OPEN REDUCTION INTERNAL FIXATION (ORIF) DISTAL RADIAL FRACTURE;  Surgeon: Iran Planas, MD;  Location: [place];  Service: Orthopedics;  Laterality: Left;  . [place]     age 37    Family History  Problem Relation Age of Onset  . Stroke Mother   . Diabetes Mother   . Kidney failure Mother   . Heart failure Mother   . Stroke Father   . Cancer Sister        Breast- 1's    SOCIAL HISTORY: Social History   Socioeconomic History  . Marital status: Single    Spouse name: Not on file  . Number of children: Not on file  . Years of education: Not on file  . Highest education level: Not on file  Occupational History  . Not on file  Social Needs  . Financial resource strain: Not on file  . Food insecurity    Worry: Not on file    Inability: Not on file  . Transportation needs    Medical: Not on file    Non-medical: Not on  file  Tobacco Use  . Smoking status: Current Every Day Smoker    Packs/day: 1.00    Years: 32.00    Pack years: 32.00    Types: Cigarettes    Last attempt to quit: 09/23/2012    Years since quitting: 6.3  . Smokeless tobacco: Never Used  . Tobacco comment: or less  Substance and Sexual Activity  . Alcohol use: No  . Drug use: No  . Sexual activity: Never  Lifestyle  . Physical activity    Days per week: Not on file    Minutes per session: Not on file  . Stress: Not on file  Relationships  . Social Herbalist on phone: Not on file    Gets together: Not on file    Attends religious service: Not on file    Active member of club or organization: Not on file    Attends meetings of clubs or organizations: Not on file    Relationship status: Not on file  . Intimate partner violence    Fear of current or ex partner: Not on file    Emotionally abused: Not on file    Physically abused: Not on file    Forced sexual activity: Not on file  Other Topics Concern  .  Not on file  Social History Narrative   Lives with brother and sister-in-law. Does not work.          Allergies  Allergen Reactions  . Hydrocodone Nausea And Vomiting    Current Outpatient Medications  Medication Sig Dispense Refill  . acetaminophen (TYLENOL) 325 MG tablet Take 2 tablets (650 mg total) by mouth every 4 (four) hours as needed. 100 tablet 2  . amLODipine (NORVASC) 10 MG tablet Take 10 mg by mouth daily.    Marland Kitchen aspirin 81 MG tablet Take 1 tablet (81 mg total) by mouth daily. 30 tablet 12  . atorvastatin (LIPITOR) 40 MG tablet Take 40 mg by mouth daily.    . diphenhydrAMINE (BENADRYL) 25 MG tablet Take 25 mg by mouth every 4 (four) hours as needed for itching.    . gabapentin (NEURONTIN) 100 MG capsule Take 100 mg by mouth 2 (two) times daily.    . insulin aspart (NOVOLOG) 100 UNIT/ML injection Inject 8 Units into the skin 3 (three) times daily with meals. 10 mL 0  . insulin glargine (LANTUS) 100  UNIT/ML injection Inject 0.24 mLs (24 Units total) into the skin daily. 10 mL 0  . lidocaine (LIDODERM) 5 % Place 1 patch onto the skin daily. Remove & Discard patch within 12 hours or as directed by MD 30 patch 0  . metoprolol (LOPRESSOR) 100 MG tablet Take 100 mg by mouth 2 (two) times daily.    . Multiple Vitamins-Minerals (CERTAVITE SENIOR/ANTIOXIDANT) TABS Take 1 tablet by mouth daily.    Marland Kitchen omeprazole (PRILOSEC) 40 MG capsule Take 40 mg by mouth daily.    . polyethylene glycol (MIRALAX / GLYCOLAX) packet Take 17 g by mouth daily. 14 each 0  . Probiotic Product (PROBIOTIC PO) Take 1 tablet by mouth 2 (two) times daily.     . ranitidine (ZANTAC) 150 MG tablet Take 1 tablet (150 mg total) by mouth 2 (two) times daily. 30 tablet 0  . senna-docusate (SENOKOT-S) 8.6-50 MG tablet Take 1 tablet by mouth at bedtime as needed for mild constipation. 30 tablet 0  . traMADol (ULTRAM) 50 MG tablet Take 0.5 tablets (25 mg total) by mouth every 12 (twelve) hours as needed. 20 tablet 0  . Vitamin D, Ergocalciferol, (DRISDOL) 50000 units CAPS capsule Take 1 capsule (50,000 Units total) by mouth every 7 (seven) days. 30 capsule 0   No current facility-administered medications for this visit.     REVIEW OF SYSTEMS:  [X]  denotes positive finding, [ ]  denotes negative finding Cardiac  Comments:  Chest pain or chest pressure:    Shortness of breath upon exertion:    Short of breath when lying flat:    Irregular heart rhythm:        Vascular    Pain in calf, thigh, or hip brought on by ambulation:    Pain in feet at night that wakes you up from your sleep:     Blood clot in your veins:    Leg swelling:         Pulmonary    Oxygen at home:    Productive cough:     Wheezing:         Neurologic    Sudden weakness in arms or legs:     Sudden numbness in arms or legs:     Sudden onset of difficulty speaking or slurred speech:    Temporary loss of vision in one eye:     Problems with dizziness:  Gastrointestinal    Blood in stool:     Vomited blood:         Genitourinary    Burning when urinating:     Blood in urine:        Psychiatric    Major depression:         Hematologic    Bleeding problems:    Problems with blood clotting too easily:        Skin    Rashes or ulcers:        Constitutional    Fever or chills:      PHYSICAL EXAM: Vitals:   02/08/19 0929  BP: 136/77  Pulse: 67  Resp: 12  Temp: 98 F (36.7 C)  TempSrc: Temporal  SpO2: 97%  Weight: 102 lb (46.3 kg)  Height: 4\' 6"  (1.372 m)    GENERAL: The patient is a well-nourished female, in no acute distress. The vital signs are documented above. CARDIAC: There is a regular rate and rhythm.  VASCULAR:  2+ radial pulse palpable bilaterally 2+ palpable brachial pulse bilaterally No tissue loss either upper extremity PULMONARY: There is good air exchange bilaterally without wheezing or rales. ABDOMEN: Soft and non-tender with normal pitched bowel sounds.  MUSCULOSKELETAL: There are no major deformities or cyanosis. NEUROLOGIC: No focal weakness or paresthesias are detected.   DATA:   I independently reviewed her vein mapping and she has small surface veins bilaterally.  Upper extremity arterial duplex shows triphasic waveforms in both arms.  Assessment/Plan:  58 year old female with stage V chronic kidney disease that needs permanent dialysis access.  Unfortunately after review of her vein mapping she has no usable surface veins in either upper extremity.  That being said I think she is going require AV graft and we will plan for left arm placement given she is right-hand dominant and walks with a cane.  We will schedule next available date.  Risk and benefits discussed in detail with the patient.   [name], MD [facility] Office: [phone] Pager: [phone]

## 2019-02-09 ENCOUNTER — Other Ambulatory Visit: Payer: Self-pay | Admitting: *Deleted

## 2019-02-17 ENCOUNTER — Other Ambulatory Visit: Payer: Self-pay

## 2019-02-17 ENCOUNTER — Encounter (HOSPITAL_COMMUNITY): Payer: Self-pay | Admitting: *Deleted

## 2019-02-17 NOTE — Pre-Procedure Instructions (Addendum)
   RAIME DEHM  02/17/2019     Ms. Dalal's procedure is scheduled on Monday, 02/21/19 at 12:28 PM.   Report to [facility] Entrance "A" Admitting Office at 10:00 AM.   Call this number if you have problems the morning of surgery: [phone]   Questions prior to day of surgery, please call [phone] between 8 & 4 PM.   Remember:  Patient is not to eat or drink after midnight Sunday, 02/20/19.  Take these medicines the morning of surgery with A SIP OF WATER: Amlodipine , Aspirin, Gabapentin, Metoprolol (Lopressor), Fluoxetine, Omeprazole, Famotidine, Tylenol - if needed   Do not give her Novolog insulin Monday AM. Please check her blood sugar when she awakens Monday AM and every 2 hours until she leaves for the hospital.  If blood sugar is 70 or below, treat with 1/2 cup of clear juice (apple or cranberry) and recheck blood sugar 15 minutes after drinking juice. If blood sugar continues to be 70 or below, call the Short Stay department and ask to speak to a nurse.   Do not wear jewelry, make-up or nail polish.  Do not wear lotions, powders, perfumes or deodorant.  Do not shave 48 hours prior to surgery.    Do not bring valuables to the hospital.  [facility] is not responsible for any belongings or valuables.  Contacts, dentures or bridgework may not be worn into surgery.  Please make sure patient brushes her teeth the morning of surgery.  If any questions today or tomorrow, please call me, Lilia Pro, RN at [phone].

## 2019-02-17 NOTE — Progress Notes (Signed)
Pt is a resident at [facility]. Spoke with [employer], Scientist, physiological. She states pt is alert, oriented and can speak for herself. She lives at the assisted living because of hx of stroke. Pt is a diabetic. Blondie states pt's fasting blood sugar can range from 120-230. Will be faxing diabetes instructions to [facility] in the pre-op instructions. Pt will go for her Covid test tomorrow. Informed Blondie that pt would need to be in quarantine once she has her test done. She states that they will make sure pt stays in her room.    Coronavirus Screening  Have you experienced the following symptoms:  Cough yes/no: No Fever (>100.16F)  yes/no: No Runny nose yes/no: No Sore throat yes/no: No Difficulty breathing/shortness of breath  yes/no: No  Have you or a family member traveled in the last 14 days and where? yes/no: No   Blondie reminded that hospital visitation restrictions are in effect and the importance of the restrictions. Blondie states that she will drop pt off and will be the one picking her up.

## 2019-02-18 ENCOUNTER — Other Ambulatory Visit (HOSPITAL_COMMUNITY)
Admission: RE | Admit: 2019-02-18 | Discharge: 2019-02-18 | Disposition: A | Discharge status: Home / Self Care | Payer: Medicare Other | Source: Ambulatory Visit | Attending: Vascular Surgery | Admitting: Vascular Surgery

## 2019-02-18 ENCOUNTER — Encounter (HOSPITAL_COMMUNITY): Payer: Self-pay | Admitting: Vascular Surgery

## 2019-02-18 DIAGNOSIS — Z20828 Contact with and (suspected) exposure to other viral communicable diseases: Secondary | ICD-10-CM | POA: Insufficient documentation

## 2019-02-18 DIAGNOSIS — Z01812 Encounter for preprocedural laboratory examination: Secondary | ICD-10-CM | POA: Diagnosis present

## 2019-02-18 LAB — SARS CORONAVIRUS 2 (TAT 6-24 HRS): SARS Coronavirus 2: NEGATIVE

## 2019-02-18 NOTE — Anesthesia Preprocedure Evaluation (Addendum)
Anesthesia Evaluation  Patient identified by MRN, date of birth, ID band Patient awake    Reviewed: Allergy & Precautions, NPO status , Patient's Chart, lab work & pertinent test results  Airway Mallampati: III  TM Distance: >3 FB Neck ROM: Full    Dental no notable dental hx.    Pulmonary Current SmokerPatient did not abstain from smoking.,    Pulmonary exam normal breath sounds clear to auscultation       Cardiovascular hypertension, Pt. on medications and Pt. on home beta blockers Normal cardiovascular exam Rhythm:Regular Rate:Normal  ECG: NSR, rate 70  ECHO: LV EF: 60% -   65%   Neuro/Psych PSYCHIATRIC DISORDERS Depression TIACVA    GI/Hepatic Neg liver ROS, GERD  Medicated and Controlled,  Endo/Other  diabetes, Insulin Dependent  Renal/GU Renal disease     Musculoskeletal negative musculoskeletal ROS (+)   Abdominal   Peds  Hematology  (+) anemia , HLD   Anesthesia Other Findings CHRONIC KIDNEY DISEASE  Reproductive/Obstetrics                            Anesthesia Physical Anesthesia Plan  ASA: III  Anesthesia Plan: MAC   Post-op Pain Management:    Induction: Intravenous  PONV Risk Score and Plan: 1 and Ondansetron, Treatment may vary due to age or medical condition and Propofol infusion  Airway Management Planned: Simple Face Mask  Additional Equipment:   Intra-op Plan:   Post-operative Plan:   Informed Consent: I have reviewed the patients History and Physical, chart, labs and discussed the procedure including the risks, benefits and alternatives for the proposed anesthesia with the patient or authorized representative who has indicated his/her understanding and acceptance.     Dental advisory given  Plan Discussed with: CRNA  Anesthesia Plan Comments: (Reviewed PAT note written 02/18/2019 by Myra Gianotti, PA-C.   )      Anesthesia Quick Evaluation

## 2019-02-18 NOTE — Progress Notes (Signed)
Anesthesia Chart Review: SAME DAY WORK-UP   Case: [name] Michael Date/Time: 02/21/19 1213   Procedure: ARTERIOVENOUS (AV) FISTULA CREATION VERSUS GRAFT (Left )   Anesthesia type: Choice   Pre-op diagnosis: CHRONIC KIDNEY DISEASE   Location: [facility] OR ROOM 11 / [facility]   Surgeon: [name], MD      DISCUSSION: Patient is a 58 year old female scheduled for the above procedure.  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 She is not yet on hemodialysis.   Patient is a resident at [facility]. Administrator staff member "Blondie" reported patient is A&O and can speak for herself. She is in the ASL due to stroke history. Reported fasting CBGs run ~ 120-230.   Presurgical COVID-19 test was done on 02/18/19 and is in process. If negative, she will come in for planned surgery with labs and anesthesia team evaluation on the day of surgery.   VS: LMP 03/06/2013   BP Readings from Last 3 Encounters:  02/08/19 136/77  10/12/18 129/70  08/31/18 131/61    PROVIDERS: Charlott Rakes, MD is listed as PCP, but last visit seen is from 04/29/16.  Nephrologist is with [facility]   LABS: For day of surgery.   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 Image quality is degraded on the lateral radiograph due to the patient's inability to raise the right arm. No airspace consolidation, edema, pleural effusion, or pneumothorax is identified. No acute osseous abnormality is seen. IMPRESSION: No active cardiopulmonary disease.  MRA neck 03/28/11: IMPRESSION: Unremarkable MR angiography extracranial circulation.   EKG: 10/12/18: NSR   CV: Echo 06/10/18: Study Conclusions - Left ventricle: The cavity size was normal. Wall  thickness was   normal. Systolic function was normal. The estimated ejection   fraction was in the range of 60% to 65%. Wall motion was normal;   there were no regional wall motion abnormalities. Features are   consistent with a pseudonormal left ventricular filling pattern,   with concomitant abnormal relaxation and increased filling   pressure (grade 2 diastolic dysfunction). Doppler parameters are   consistent with high ventricular filling pressure. - Aortic valve: Transvalvular velocity was within the normal range.   There was no stenosis. There was no regurgitation. - Mitral valve: There was mild regurgitation. - Left atrium: The atrium was normal in size. - Right ventricle: The cavity size was normal. Wall thickness was   normal. Systolic function was normal. RV systolic pressure (S,   est): 37 mm Hg. - Right atrium: The atrium was normal in size. - Tricuspid valve: There was mild regurgitation. - Pulmonic valve: There was trivial regurgitation. - Pulmonary arteries: Systolic pressure was mildly to moderately   increased. PA peak pressure: 37 mm Hg (S). - Inferior vena cava: The vessel was normal in size. The   respirophasic diameter changes were in the normal range (>= 50%),   consistent with normal central venous pressure. - Pericardium, extracardiac: There was no pericardial effusion.  Remote history of non-ischemic stress test in 2004.   Past Medical History:  Diagnosis Date  . Diabetes mellitus   . Hyperlipidemia   . Hypertension   . Stroke [facility]) 04-01-11   left frontal subcortical, saw Dr. [name]   . TIA (transient ischemic attack) 03-12-11    Past Surgical History:  Procedure Laterality Date  .  OPEN REDUCTION INTERNAL FIXATION (ORIF) DISTAL RADIAL FRACTURE Left 01/28/2016   Procedure: OPEN REDUCTION INTERNAL FIXATION (ORIF) DISTAL RADIAL FRACTURE;  Surgeon: Iran Planas, MD;  Location: [place];  Service: Orthopedics;  Laterality: Left;  . [place]     age 72    MEDICATIONS: No current facility-administered medications for this encounter.    Marland Kitchen acetaminophen (TYLENOL) 325 MG tablet  . amLODipine (NORVASC) 10 MG tablet  . aspirin 81 MG tablet  . atorvastatin (LIPITOR) 40 MG tablet  . diphenhydrAMINE (BENADRYL) 25 MG tablet  . gabapentin (NEURONTIN) 100 MG capsule  . insulin aspart (NOVOLOG) 100 UNIT/ML injection  . insulin glargine (LANTUS) 100 UNIT/ML injection  . lidocaine (LIDODERM) 5 %  . metoprolol (LOPRESSOR) 100 MG tablet  . Multiple Vitamins-Minerals (CERTAVITE SENIOR/ANTIOXIDANT) TABS  . omeprazole (PRILOSEC) 40 MG capsule  . polyethylene glycol (MIRALAX / GLYCOLAX) packet  . Probiotic Product (PROBIOTIC PO)  . ranitidine (ZANTAC) 150 MG tablet  . senna-docusate (SENOKOT-S) 8.6-50 MG tablet  . traMADol (ULTRAM) 50 MG tablet  . Vitamin D, Ergocalciferol, (DRISDOL) 50000 units CAPS capsule     Myra Gianotti, PA-C Surgical Short Stay/Anesthesiology [facility] Phone [phone] [facility] Phone [phone] 02/18/2019 11:21 AM

## 2019-02-21 ENCOUNTER — Encounter (HOSPITAL_COMMUNITY)
Admission: RE | Disposition: A | Discharge status: Home / Self Care | Payer: Self-pay | Source: Home / Self Care | Attending: Vascular Surgery

## 2019-02-21 ENCOUNTER — Other Ambulatory Visit: Payer: Self-pay

## 2019-02-21 ENCOUNTER — Ambulatory Visit (HOSPITAL_COMMUNITY): Payer: Medicare Other | Admitting: Vascular Surgery

## 2019-02-21 ENCOUNTER — Encounter (HOSPITAL_COMMUNITY): Payer: Self-pay

## 2019-02-21 ENCOUNTER — Ambulatory Visit (HOSPITAL_COMMUNITY)
Admission: RE | Admit: 2019-02-21 | Discharge: 2019-02-21 | Disposition: A | Discharge status: Home / Self Care | Payer: Medicare Other | Attending: Vascular Surgery | Admitting: Vascular Surgery

## 2019-02-21 DIAGNOSIS — Z8673 Personal history of transient ischemic attack (TIA), and cerebral infarction without residual deficits: Secondary | ICD-10-CM | POA: Diagnosis not present

## 2019-02-21 DIAGNOSIS — N185 Chronic kidney disease, stage 5: Secondary | ICD-10-CM

## 2019-02-21 DIAGNOSIS — F17210 Nicotine dependence, cigarettes, uncomplicated: Secondary | ICD-10-CM | POA: Diagnosis not present

## 2019-02-21 DIAGNOSIS — F329 Major depressive disorder, single episode, unspecified: Secondary | ICD-10-CM | POA: Insufficient documentation

## 2019-02-21 DIAGNOSIS — E1122 Type 2 diabetes mellitus with diabetic chronic kidney disease: Secondary | ICD-10-CM | POA: Insufficient documentation

## 2019-02-21 DIAGNOSIS — I129 Hypertensive chronic kidney disease with stage 1 through stage 4 chronic kidney disease, or unspecified chronic kidney disease: Secondary | ICD-10-CM | POA: Insufficient documentation

## 2019-02-21 DIAGNOSIS — Z79899 Other long term (current) drug therapy: Secondary | ICD-10-CM | POA: Diagnosis not present

## 2019-02-21 DIAGNOSIS — K219 Gastro-esophageal reflux disease without esophagitis: Secondary | ICD-10-CM | POA: Diagnosis not present

## 2019-02-21 DIAGNOSIS — Z794 Long term (current) use of insulin: Secondary | ICD-10-CM | POA: Insufficient documentation

## 2019-02-21 DIAGNOSIS — Z7982 Long term (current) use of aspirin: Secondary | ICD-10-CM | POA: Diagnosis not present

## 2019-02-21 DIAGNOSIS — E785 Hyperlipidemia, unspecified: Secondary | ICD-10-CM | POA: Diagnosis not present

## 2019-02-21 HISTORY — PX: AV FISTULA PLACEMENT: SHX1204

## 2019-02-21 LAB — GLUCOSE, CAPILLARY
Glucose-Capillary: 101 mg/dL — ABNORMAL HIGH (ref 70–99)
Glucose-Capillary: 62 mg/dL — ABNORMAL LOW (ref 70–99)
Glucose-Capillary: 168 mg/dL — ABNORMAL HIGH (ref 70–99)
Glucose-Capillary: 233 mg/dL — ABNORMAL HIGH (ref 70–99)
Glucose-Capillary: 118 mg/dL — ABNORMAL HIGH (ref 70–99)

## 2019-02-21 LAB — POCT I-STAT 4, (NA,K, GLUC, HGB,HCT)
Glucose, Bld: 54 mg/dL — ABNORMAL LOW (ref 70–99)
HCT: 33 % — ABNORMAL LOW (ref 36.0–46.0)
Hemoglobin: 11.2 g/dL — ABNORMAL LOW (ref 12.0–15.0)
Potassium: 4.3 mmol/L (ref 3.5–5.1)
Sodium: 141 mmol/L (ref 135–145)

## 2019-02-21 SURGERY — Surgical Case
Anesthesia: *Unknown

## 2019-02-21 SURGERY — ARTERIOVENOUS (AV) FISTULA CREATION
Anesthesia: Monitor Anesthesia Care | Laterality: Left

## 2019-02-21 MED ORDER — HEPARIN SODIUM (PORCINE) 1000 UNIT/ML IJ SOLN
INTRAMUSCULAR | Status: DC | PRN
  Administered 2019-02-21: 3000 [IU] via INTRAVENOUS

## 2019-02-21 MED ORDER — ESMOLOL HCL 100 MG/10ML IV SOLN
INTRAVENOUS | Status: AC
  Filled 2019-02-21: qty 10

## 2019-02-21 MED ORDER — DEXTROSE 50 % IV SOLN
25.0000 g | INTRAVENOUS | Status: AC
  Administered 2019-02-21: 11:00:00 25 g via INTRAVENOUS
  Filled 2019-02-21: qty 50

## 2019-02-21 MED ORDER — ACETAMINOPHEN 500 MG PO TABS
1000.0000 mg | ORAL_TABLET | Freq: Once | ORAL | Status: AC
  Administered 2019-02-21: 1000 mg via ORAL
  Filled 2019-02-21: qty 2

## 2019-02-21 MED ORDER — CEFAZOLIN SODIUM-DEXTROSE 2-4 GM/100ML-% IV SOLN
2.0000 g | INTRAVENOUS | Status: AC
  Administered 2019-02-21: 2 g via INTRAVENOUS

## 2019-02-21 MED ORDER — PROPOFOL 500 MG/50ML IV EMUL
INTRAVENOUS | Status: DC | PRN
  Administered 2019-02-21: 100 ug/kg/min via INTRAVENOUS

## 2019-02-21 MED ORDER — SODIUM CHLORIDE 0.9 % IV SOLN
INTRAVENOUS | Status: AC
  Filled 2019-02-21: qty 1.2

## 2019-02-21 MED ORDER — CEFAZOLIN SODIUM-DEXTROSE 2-4 GM/100ML-% IV SOLN
INTRAVENOUS | Status: AC
  Filled 2019-02-21: qty 100

## 2019-02-21 MED ORDER — OXYCODONE-ACETAMINOPHEN 5-325 MG PO TABS: 1.0000 | ORAL_TABLET | Freq: Three times a day (TID) | ORAL | 0 refills | Status: DC | PRN

## 2019-02-21 MED ORDER — ONDANSETRON HCL 4 MG/2ML IJ SOLN
INTRAMUSCULAR | Status: AC
  Filled 2019-02-21: qty 2

## 2019-02-21 MED ORDER — LIDOCAINE 2% (20 MG/ML) 5 ML SYRINGE
INTRAMUSCULAR | Status: AC
  Filled 2019-02-21: qty 5

## 2019-02-21 MED ORDER — LIDOCAINE HCL 1 % IJ SOLN
INTRAMUSCULAR | Status: AC
  Filled 2019-02-21: qty 20

## 2019-02-21 MED ORDER — PROPOFOL 10 MG/ML IV BOLUS
INTRAVENOUS | Status: DC | PRN
  Administered 2019-02-21: 20 mg via INTRAVENOUS

## 2019-02-21 MED ORDER — DEXTROSE 50 % IV SOLN: 12.5000 g | INTRAVENOUS | Status: DC

## 2019-02-21 MED ORDER — 0.9 % SODIUM CHLORIDE (POUR BTL) OPTIME
TOPICAL | Status: DC | PRN
  Administered 2019-02-21: 1000 mL

## 2019-02-21 MED ORDER — FENTANYL CITRATE (PF) 250 MCG/5ML IJ SOLN
INTRAMUSCULAR | Status: AC
  Filled 2019-02-21: qty 5

## 2019-02-21 MED ORDER — PROPOFOL 10 MG/ML IV BOLUS
INTRAVENOUS | Status: AC
  Filled 2019-02-21: qty 20

## 2019-02-21 MED ORDER — LIDOCAINE 2% (20 MG/ML) 5 ML SYRINGE
INTRAMUSCULAR | Status: DC | PRN
  Administered 2019-02-21: 60 mg via INTRAVENOUS

## 2019-02-21 MED ORDER — HEPARIN SODIUM (PORCINE) 1000 UNIT/ML IJ SOLN
INTRAMUSCULAR | Status: AC
  Filled 2019-02-21: qty 1

## 2019-02-21 MED ORDER — DEXTROSE 50 % IV SOLN
INTRAVENOUS | Status: AC
  Administered 2019-02-21: 25 g via INTRAVENOUS
  Filled 2019-02-21: qty 50

## 2019-02-21 MED ORDER — SODIUM CHLORIDE 0.9 % IV SOLN
INTRAVENOUS | Status: DC | PRN
  Administered 2019-02-21: 500 mL

## 2019-02-21 MED ORDER — LIDOCAINE HCL 1 % IJ SOLN
INTRAMUSCULAR | Status: DC | PRN
  Administered 2019-02-21: 30 mL

## 2019-02-21 MED ORDER — PROPOFOL 1000 MG/100ML IV EMUL
INTRAVENOUS | Status: AC
  Filled 2019-02-21: qty 200

## 2019-02-21 MED ORDER — MIDAZOLAM HCL 2 MG/2ML IJ SOLN
INTRAMUSCULAR | Status: AC
  Filled 2019-02-21: qty 2

## 2019-02-21 MED ORDER — SODIUM CHLORIDE 0.9 % IV SOLN
INTRAVENOUS | Status: DC
  Administered 2019-02-21 (×2): via INTRAVENOUS

## 2019-02-21 MED ORDER — ONDANSETRON HCL 4 MG/2ML IJ SOLN
INTRAMUSCULAR | Status: DC | PRN
  Administered 2019-02-21: 4 mg via INTRAVENOUS

## 2019-02-21 SURGICAL SUPPLY — 32 items
ARMBAND PINK RESTRICT EXTREMIT (MISCELLANEOUS) ×3 IMPLANT
CANISTER SUCT 3000ML PPV (MISCELLANEOUS) ×2 IMPLANT
CLIP VESOCCLUDE MED 6/CT (CLIP) ×2 IMPLANT
CLIP VESOCCLUDE SM WIDE 6/CT (CLIP) ×2 IMPLANT
COVER PROBE W GEL 5X96 (DRAPES) ×2 IMPLANT
COVER WAND RF STERILE (DRAPES) ×1 IMPLANT
DECANTER SPIKE VIAL GLASS SM (MISCELLANEOUS) ×2 IMPLANT
DERMABOND ADVANCED (GAUZE/BANDAGES/DRESSINGS)
DERMABOND ADVANCED .7 DNX12 (GAUZE/BANDAGES/DRESSINGS) ×1 IMPLANT
ELECT REM PT RETURN 9FT ADLT (ELECTROSURGICAL) ×2
ELECTRODE REM PT RTRN 9FT ADLT (ELECTROSURGICAL) ×1 IMPLANT
GLOVE BIO SURGEON STRL SZ7.5 (GLOVE) ×2 IMPLANT
GLOVE BIOGEL PI IND STRL 8 (GLOVE) ×1 IMPLANT
GLOVE BIOGEL PI INDICATOR 8 (GLOVE) ×1
GOWN STRL REUS W/ TWL LRG LVL3 (GOWN DISPOSABLE) ×2 IMPLANT
GOWN STRL REUS W/ TWL XL LVL3 (GOWN DISPOSABLE) ×2 IMPLANT
GOWN STRL REUS W/TWL LRG LVL3 (GOWN DISPOSABLE) ×2
GOWN STRL REUS W/TWL XL LVL3 (GOWN DISPOSABLE) ×2
HEMOSTAT SPONGE AVITENE ULTRA (HEMOSTASIS) IMPLANT
KIT BASIN OR (CUSTOM PROCEDURE TRAY) ×2 IMPLANT
KIT TURNOVER KIT B (KITS) ×2 IMPLANT
NS IRRIG 1000ML POUR BTL (IV SOLUTION) ×2 IMPLANT
PACK CV ACCESS (CUSTOM PROCEDURE TRAY) ×2 IMPLANT
PAD ARMBOARD 7.5X6 YLW CONV (MISCELLANEOUS) ×4 IMPLANT
SUT MNCRL AB 4-0 PS2 18 (SUTURE) ×2 IMPLANT
SUT PROLENE 6 0 BV (SUTURE) ×2 IMPLANT
SUT PROLENE 7 0 BV 1 (SUTURE) IMPLANT
SUT VIC AB 3-0 SH 27 (SUTURE) ×1
SUT VIC AB 3-0 SH 27X BRD (SUTURE) ×1 IMPLANT
TOWEL GREEN STERILE (TOWEL DISPOSABLE) ×2 IMPLANT
UNDERPAD 30X30 (UNDERPADS AND DIAPERS) ×2 IMPLANT
WATER STERILE IRR 1000ML POUR (IV SOLUTION) ×2 IMPLANT

## 2019-02-21 NOTE — Op Note (Signed)
OPERATIVE NOTE   PROCEDURE: 1. left first stage basilic vein transposition (brachiobasilic arteriovenous fistula) placement  PRE-OPERATIVE DIAGNOSIS: CKD  POST-OPERATIVE DIAGNOSIS: Same  SURGEON: [name], MD  ASSISTANT(S): OR staff  ANESTHESIA: MAC  ESTIMATED BLOOD LOSS: Minimal  FINDING(S): 1.  Basilic vein: 2 mm, borderline 2.  Brachial artery: 2.5 mm, disease free 3.  Venous outflow: faintly palpable thrill  4.  Radial flow: dopplerable radial signal  SPECIMEN(S):  none  INDICATIONS:   [name] Michael is a 58 y.o. female who presents with CKD and plan for permanent hemodialysis access.  Her preoperative vein mapping did not show any evidence of superficial veins that would be usable.  I discussed likely plan for left upper arm AV graft.  Initially used ultrasound to mark out her brachial artery above the antecubitum as well as her brachial vein up toward the axilla.  I made two separate small incisions over the artery and the vein up by the axilla.  When I dissected down through the brachial artery exposure I found a basilic vein that I did not initially see on vein mapping.  I then reevaluated this ultrasound and it appeared to be usable after looking at the remaining basilic vein in the upper arm.  She has very small arteries and I was very concerned after dissecting out her brachial artery that a graft here may put her at exceedingly high risk for steal given the artery only appeared to be about 2.5 mm.  I think an attempted a basilic vein fistula is the best option for her given the small size of her artery.   DESCRIPTION: After full informed written consent was obtained from the patient, the patient was brought back to the operating room and placed supine upon the operating table.  Prior to induction, the patient received IV antibiotics.   After obtaining adequate anesthesia, the patient was then prepped and draped in the standard fashion for a left arm access  procedure.  I turned my attention first to identifying the brachial artery above the antecubitum and the brachial vein about the axilla given initial plan for upper arm graft.    Using SonoSite guidance, the location of these vessels were marked out on the skin.   At this point, I injected local anesthetic to obtain a field block in each of these locations.  In total, I injected about 10 mL of 1% lidocaine without epinephrine.    I initially made a longitudinal incision over the brachial artery exposure just above the antecubitum and by the brachial vein exposure in the axilla.  Dissected down through the subcutaneous tissue and fascia with Bovie cautery and identified the artery which was very small and only about 2-1/2 mm.  I found a basilic vein here that I did not see on initial vein mapping.  I used a sterile probe and looked at the basilic vein on up the arm and it looked at least 2.5 f millimeters and likely usable.  I then dissected out the basilic vein.  This was noted to be 2 mm in diameter externally but 2.5 mm higher up the arm.  The distal segment of the vein was ligated with a  2-0 silk, and the vein was transected.  The proximal segment was interrogated with serial dilators.  The vein accepted up to a 4 mm dilator without any difficulty.  I then instilled the heparinized saline into the vein and clamped it.  At this point, I reset my  exposure of the brachial artery and placed the artery under tension proximally and distally.  I made an arteriotomy with a #11 blade, and then I extended the arteriotomy with a Potts scissor.  I injected heparinized saline proximal and distal to this arteriotomy.  The vein was then sewn to the artery in an end-to-side configuration with a running stitch of 6-0 Prolene.  Prior to completing this anastomosis, I allowed the vein and artery to backbleed.  There was no evidence of clot from any vessels.  I completed the anastomosis in the usual fashion and then released  all vessel loops and clamps.    There was a faintly palpable thrill in the venous outflow, and there was a dopplerable radial signal.  At this point, I irrigated out the surgical wound.  There was no further active bleeding.  The subcutaneous tissue was reapproximated with a running stitch of 3-0 Vicryl.  The skin was then reapproximated with a running subcuticular stitch of 4-0 Monocryl.  The skin was then cleaned, dried, and reinforced with Dermabond.  The upper arm skin incision was closed with running 4-0 Monocryl and Dermabond was applied.   COMPLICATIONS: None  CONDITION: Stable   [name] MD [facility] Office: [phone] Pager: [phone]  02/21/2019, 2:03 PM

## 2019-02-21 NOTE — Transfer of Care (Signed)
Immediate Anesthesia Transfer of Care Note  Patient: [name] Michael  Procedure(s) Performed: BRACHIOCEPHALIC ARTERIOVENOUS (AV) FISTULA CREATION (Left )  Patient Location: PACU  Anesthesia Type:MAC  Level of Consciousness: awake, alert  and oriented  Airway & Oxygen Therapy: Patient Spontanous Breathing and Patient connected to face mask oxygen  Post-op Assessment: Report given to RN and Post -op Vital signs reviewed and stable  Post vital signs: Reviewed and stable  Last Vitals:  Vitals Value Taken Time  BP 103/52 02/21/19 1419  Temp 36.1 C 02/21/19 1420  Pulse 59 02/21/19 1423  Resp 10 02/21/19 1423  SpO2 94 % 02/21/19 1423  Vitals shown include unvalidated device data.  Last Pain:  Vitals:   02/21/19 1420  PainSc: Asleep      Patients Stated Pain Goal: 3 (97/94/80 1655)  Complications: No apparent anesthesia complications

## 2019-02-21 NOTE — H&P (Signed)
History and Physical Interval Note:  02/21/2019 12:28 PM  [name] Michael  has presented today for surgery, with the diagnosis of CHRONIC KIDNEY DISEASE.  The various methods of treatment have been discussed with the patient and family. After consideration of risks, benefits and other options for treatment, the patient has consented to  Procedure(s): ARTERIOVENOUS (AV) FISTULA CREATION VERSUS GRAFT (Left) as a surgical intervention.  The patient's history has been reviewed, patient examined, no change in status, stable for surgery.  I have reviewed the patient's chart and labs.  Questions were answered to the patient's satisfaction.    Left arm AV graft  [name]  Patient name: [name] Michael         MRN: [id number]        DOB: Mar 11, 1961          Sex: female  REASON FOR CONSULT: New AV fistula access evaluation  HPI: [name] Michael is a 58 y.o. female, with history of hypertension, hyperlipidemia, diabetes, and stage V chronic kidney disease, as well as previous stroke that presents for new dialysis access evaluation.  Patient states she has never had any previous access before.  No previous tunneled catheters.  She is right-handed.  She does walk with a cane.  Currently living in a group home.  States she has broken both of her wrists in the past.  Otherwise no numbness tingling in her hands of either upper extremity.      Past Medical History:  Diagnosis Date  . Diabetes mellitus   . Hyperlipidemia   . Hypertension   . Stroke [facility]) 04-01-11   left frontal subcortical, saw Dr. [name]   . TIA (transient ischemic attack) 03-12-11         Past Surgical History:  Procedure Laterality Date  . OPEN REDUCTION INTERNAL FIXATION (ORIF) DISTAL RADIAL FRACTURE Left 01/28/2016   Procedure: OPEN REDUCTION INTERNAL FIXATION (ORIF) DISTAL RADIAL FRACTURE;  Surgeon: Iran Planas, MD;  Location: [place];  Service: Orthopedics;  Laterality: Left;  . [place]     age 35         Family History  Problem Relation Age of Onset  . Stroke Mother   . Diabetes Mother   . Kidney failure Mother   . Heart failure Mother   . Stroke Father   . Cancer Sister        Breast- 28's    SOCIAL HISTORY: Social History        Socioeconomic History  . Marital status: Single    Spouse name: Not on file  . Number of children: Not on file  . Years of education: Not on file  . Highest education level: Not on file  Occupational History  . Not on file  Social Needs  . Financial resource strain: Not on file  . Food insecurity    Worry: Not on file    Inability: Not on file  . Transportation needs    Medical: Not on file    Non-medical: Not on file  Tobacco Use  . Smoking status: Current Every Day Smoker    Packs/day: 1.00    Years: 32.00    Pack years: 32.00    Types: Cigarettes    Last attempt to quit: 09/23/2012    Years since quitting: 6.3  . Smokeless tobacco: Never Used  . Tobacco comment: or less  Substance and Sexual Activity  .  Alcohol use: No  . Drug use: No  . Sexual activity: Never  Lifestyle  . Physical activity    Days per week: Not on file    Minutes per session: Not on file  . Stress: Not on file  Relationships  . Social Herbalist on phone: Not on file    Gets together: Not on file    Attends religious service: Not on file    Active member of club or organization: Not on file    Attends meetings of clubs or organizations: Not on file    Relationship status: Not on file  . Intimate partner violence    Fear of current or ex partner: Not on file    Emotionally abused: Not on file    Physically abused: Not on file    Forced sexual activity: Not on file  Other Topics Concern  . Not on file  Social History Narrative   Lives with brother and sister-in-law. Does not work.              Allergies   Allergen Reactions  . Hydrocodone Nausea And Vomiting          Current Outpatient Medications  Medication Sig Dispense Refill  . acetaminophen (TYLENOL) 325 MG tablet Take 2 tablets (650 mg total) by mouth every 4 (four) hours as needed. 100 tablet 2  . amLODipine (NORVASC) 10 MG tablet Take 10 mg by mouth daily.    Marland Kitchen aspirin 81 MG tablet Take 1 tablet (81 mg total) by mouth daily. 30 tablet 12  . atorvastatin (LIPITOR) 40 MG tablet Take 40 mg by mouth daily.    . diphenhydrAMINE (BENADRYL) 25 MG tablet Take 25 mg by mouth every 4 (four) hours as needed for itching.    . gabapentin (NEURONTIN) 100 MG capsule Take 100 mg by mouth 2 (two) times daily.    . insulin aspart (NOVOLOG) 100 UNIT/ML injection Inject 8 Units into the skin 3 (three) times daily with meals. 10 mL 0  . insulin glargine (LANTUS) 100 UNIT/ML injection Inject 0.24 mLs (24 Units total) into the skin daily. 10 mL 0  . lidocaine (LIDODERM) 5 % Place 1 patch onto the skin daily. Remove & Discard patch within 12 hours or as directed by MD 30 patch 0  . metoprolol (LOPRESSOR) 100 MG tablet Take 100 mg by mouth 2 (two) times daily.    . Multiple Vitamins-Minerals (CERTAVITE SENIOR/ANTIOXIDANT) TABS Take 1 tablet by mouth daily.    Marland Kitchen omeprazole (PRILOSEC) 40 MG capsule Take 40 mg by mouth daily.    . polyethylene glycol (MIRALAX / GLYCOLAX) packet Take 17 g by mouth daily. 14 each 0  . Probiotic Product (PROBIOTIC PO) Take 1 tablet by mouth 2 (two) times daily.     . ranitidine (ZANTAC) 150 MG tablet Take 1 tablet (150 mg total) by mouth 2 (two) times daily. 30 tablet 0  . senna-docusate (SENOKOT-S) 8.6-50 MG tablet Take 1 tablet by mouth at bedtime as needed for mild constipation. 30 tablet 0  . traMADol (ULTRAM) 50 MG tablet Take 0.5 tablets (25 mg total) by mouth every 12 (twelve) hours as needed. 20 tablet 0  . Vitamin D, Ergocalciferol, (DRISDOL) 50000 units CAPS capsule Take 1 capsule (50,000 Units total) by  mouth every 7 (seven) days. 30 capsule 0   No current facility-administered medications for this visit.     REVIEW OF SYSTEMS:  [X]  denotes positive finding, [ ]  denotes negative finding  Cardiac  Comments:  Chest pain or chest pressure:    Shortness of breath upon exertion:    Short of breath when lying flat:    Irregular heart rhythm:        Vascular    Pain in calf, thigh, or hip brought on by ambulation:    Pain in feet at night that wakes you up from your sleep:     Blood clot in your veins:    Leg swelling:         Pulmonary    Oxygen at home:    Productive cough:     Wheezing:         Neurologic    Sudden weakness in arms or legs:     Sudden numbness in arms or legs:     Sudden onset of difficulty speaking or slurred speech:    Temporary loss of vision in one eye:     Problems with dizziness:         Gastrointestinal    Blood in stool:     Vomited blood:         Genitourinary    Burning when urinating:     Blood in urine:        Psychiatric    Major depression:         Hematologic    Bleeding problems:    Problems with blood clotting too easily:        Skin    Rashes or ulcers:        Constitutional    Fever or chills:      PHYSICAL EXAM:    Vitals:   02/08/19 0929  BP: 136/77  Pulse: 67  Resp: 12  Temp: 98 F (36.7 C)  TempSrc: Temporal  SpO2: 97%  Weight: 102 lb (46.3 kg)  Height: 4\' 6"  (1.372 m)    GENERAL: The patient is a well-nourished female, in no acute distress. The vital signs are documented above. CARDIAC: There is a regular rate and rhythm.  VASCULAR:  2+ radial pulse palpable bilaterally 2+ palpable brachial pulse bilaterally No tissue loss either upper extremity PULMONARY: There is good air exchange bilaterally without wheezing or rales. ABDOMEN: Soft and non-tender with normal pitched bowel sounds.  MUSCULOSKELETAL:  There are no major deformities or cyanosis. NEUROLOGIC: No focal weakness or paresthesias are detected.   DATA:   I independently reviewed her vein mapping and she has small surface veins bilaterally.  Upper extremity arterial duplex shows triphasic waveforms in both arms.  Assessment/Plan:  58 year old female with stage V chronic kidney disease that needs permanent dialysis access.  Unfortunately after review of her vein mapping she has no usable surface veins in either upper extremity.  That being said I think she is going require AV graft and we will plan for left arm placement given she is right-hand dominant and walks with a cane.  We will schedule next available date.  Risk and benefits discussed in detail with the patient.   [name], MD [facility] Office: [phone] Pager: [phone]

## 2019-02-21 NOTE — Discharge Instructions (Signed)
[facility]  Discharge Instructions  AV Fistula or Graft Surgery for Dialysis Access  Please refer to the following instructions for your post-procedure care. Your surgeon or physician assistant will discuss any changes with you.  Activity  You may drive the day following your surgery, if you are comfortable and no longer taking prescription pain medication. Resume full activity as the soreness in your incision resolves.  Bathing/Showering  You may shower after you go home. Keep your incision dry for 48 hours. Do not soak in a bathtub, hot tub, or swim until the incision heals completely. You may not shower if you have a hemodialysis catheter.  Incision Care  Clean your incision with mild soap and water after 48 hours. Pat the area dry with a clean towel. You do not need a bandage unless otherwise instructed. Do not apply any ointments or creams to your incision. You may have skin glue on your incision. Do not peel it off. It will come off on its own in about one week. Your arm may swell a bit after surgery. To reduce swelling use pillows to elevate your arm so it is above your heart. Your doctor will tell you if you need to lightly wrap your arm with an ACE bandage.  Diet  Resume your normal diet. There are not special food restrictions following this procedure. In order to heal from your surgery, it is CRITICAL to get adequate nutrition. Your body requires vitamins, minerals, and protein. Vegetables are the best source of vitamins and minerals. Vegetables also provide the perfect balance of protein. Processed food has little nutritional value, so try to avoid this.  Medications  Resume taking all of your medications. If your incision is causing pain, you may take over-the counter pain relievers such as acetaminophen (Tylenol). If you were prescribed a stronger pain medication, please be aware these medications can cause nausea and constipation. Prevent  nausea by taking the medication with a snack or meal. Avoid constipation by drinking plenty of fluids and eating foods with high amount of fiber, such as fruits, vegetables, and grains.  Do not take Tylenol if you are taking prescription pain medications.  Follow up Your surgeon may want to see you in the office following your access surgery. If so, this will be arranged at the time of your surgery.  Please call us immediately for any of the following conditions:  Increased pain, redness, drainage (pus) from your incision site Fever of 101 degrees or higher Severe or worsening pain at your incision site Hand pain or numbness.  Reduce your risk of vascular disease:  Stop smoking. If you would like help, call [facility] at 1-800-QUIT-NOW 5196446781) or [place] at [place] your cholesterol Maintain a desired weight Control your diabetes Keep your blood pressure down  Dialysis  It will take several weeks to several months for your new dialysis access to be ready for use. Your surgeon will determine when it is okay to use it. Your nephrologist will continue to direct your dialysis. You can continue to use your Permcath until your new access is ready for use.   02/21/2019 [name] Michael [id number] 05-11-1961  Surgeon(s): [name], MD  Procedure(s): BRACHIOCEPHALIC ARTERIOVENOUS (AV) FISTULA CREATION   May stick graft immediately   May stick graft on designated area only:   x Do not stick, will need follow-up in 4 weeks that our office will arrange    If you have any questions,  please call the office at [phone].

## 2019-02-21 NOTE — Anesthesia Postprocedure Evaluation (Signed)
Anesthesia Post Note  Patient: [name] Michael  Procedure(s) Performed: BRACHIOCEPHALIC ARTERIOVENOUS (AV) FISTULA CREATION (Left )     Patient location during evaluation: PACU Anesthesia Type: MAC Level of consciousness: awake Pain management: pain level controlled Vital Signs Assessment: post-procedure vital signs reviewed and stable Respiratory status: spontaneous breathing, nonlabored ventilation, respiratory function stable and patient connected to nasal cannula oxygen Cardiovascular status: stable and blood pressure returned to baseline Postop Assessment: no apparent nausea or vomiting Anesthetic complications: no    Last Vitals:  Vitals:   02/21/19 1704 02/21/19 1719  BP: (!) 131/57 133/63  Pulse: 63 67  Resp: (!) 21 18  Temp:    SpO2: 98% 95%    Last Pain:  Vitals:   02/21/19 1700  PainSc: 0-No pain   Pain Goal: Patients Stated Pain Goal: 3 (02/21/19 1128)                 Ryan P Ellender

## 2019-02-21 NOTE — Anesthesia Procedure Notes (Signed)
Procedure Name: MAC Date/Time: 02/21/2019 12:50 PM Performed by: Alain Marion, CRNA Pre-anesthesia Checklist: Patient identified, Emergency Drugs available, Suction available and Patient being monitored Oxygen Delivery Method: Simple face mask Placement Confirmation: positive ETCO2

## 2019-02-22 ENCOUNTER — Telehealth: Payer: Self-pay | Admitting: Vascular Surgery

## 2019-02-22 ENCOUNTER — Encounter (HOSPITAL_COMMUNITY): Payer: Self-pay | Admitting: Vascular Surgery

## 2019-02-22 NOTE — Telephone Encounter (Signed)
Spoke with Wells Guiles at  [facility] gave her the appt times for her ultra sound and post op, someone from the facility will be with her

## 2019-03-24 ENCOUNTER — Other Ambulatory Visit: Payer: Self-pay

## 2019-03-24 DIAGNOSIS — N185 Chronic kidney disease, stage 5: Secondary | ICD-10-CM

## 2019-03-28 ENCOUNTER — Telehealth (HOSPITAL_COMMUNITY): Payer: Self-pay

## 2019-03-28 NOTE — Telephone Encounter (Signed)
Spoke with nursing home representative. Who confirmed the appointment.   The above patient or their representative was contacted and gave the following answers to these questions:         Do you have any of the following symptoms? No  Fever                    Cough                   Shortness of breath  Do  you have any of the following other symptoms?    muscle pain         vomiting,        diarrhea        rash         weakness        red eye        abdominal pain         bruising          bruising or bleeding              joint pain           severe headache    Have you been in contact with someone who was or has been sick in the past 2 weeks? No  Yes                 Unsure                         Unable to assess   Does the person that you were in contact with have any of the following symptoms?   Cough         shortness of breath           muscle pain         vomiting,            diarrhea            rash            weakness           fever            red eye           abdominal pain           bruising  or  bleeding                joint pain                severe headache                 COMMENTS OR ACTION PLAN FOR THIS PATIENT:

## 2019-03-29 ENCOUNTER — Other Ambulatory Visit: Payer: Self-pay

## 2019-03-29 ENCOUNTER — Ambulatory Visit (HOSPITAL_COMMUNITY)
Admission: RE | Admit: 2019-03-29 | Discharge: 2019-03-29 | Disposition: A | Discharge status: Home / Self Care | Payer: Medicare Other | Source: Ambulatory Visit | Attending: Vascular Surgery | Admitting: Vascular Surgery

## 2019-03-29 ENCOUNTER — Ambulatory Visit (INDEPENDENT_AMBULATORY_CARE_PROVIDER_SITE_OTHER): Payer: Self-pay | Admitting: Vascular Surgery

## 2019-03-29 ENCOUNTER — Encounter: Payer: Self-pay | Admitting: Vascular Surgery

## 2019-03-29 ENCOUNTER — Encounter: Payer: Self-pay | Admitting: *Deleted

## 2019-03-29 ENCOUNTER — Other Ambulatory Visit: Payer: Self-pay | Admitting: *Deleted

## 2019-03-29 VITALS — BP 130/65 | HR 67 | Temp 96.5°F | Resp 14 | Ht <= 58 in | Wt 105.0 lb

## 2019-03-29 DIAGNOSIS — N185 Chronic kidney disease, stage 5: Secondary | ICD-10-CM | POA: Diagnosis not present

## 2019-03-29 NOTE — Progress Notes (Signed)
Pre-op instruction letter given to staff member from [place] at [facility] assisted [facility].

## 2019-03-29 NOTE — Progress Notes (Signed)
Patient name: [name] Michael MRN: [id number] DOB: 1960-12-23 Sex: female  REASON FOR VISIT: Postop check after left first stage brachiobasilic fistula  HPI: [name] Michael is a 58 y.o. female with stage V chronic kidney disease not currently on dialysis that presents for postop check after left first stage brachiobasilic fistula.  Fistula was placed on 02/21/2019.  She reports no issues postop.  Her left hand feels okay.  She can feel a thrill.  The incision has healed.  She lives in a assisted living facility.  States she has not started dialysis yet and was told that she has still 10% function in her kidneys.  Past Medical History:  Diagnosis Date  . CKD (chronic kidney disease)   . Diabetes mellitus   . Hyperlipidemia   . Hypertension   . Stroke [facility]) 04-01-11   left frontal subcortical, saw Dr. [name]   . TIA (transient ischemic attack) 03-12-11    Past Surgical History:  Procedure Laterality Date  . AV FISTULA PLACEMENT Left 02/21/2019   Procedure: BRACHIOCEPHALIC ARTERIOVENOUS (AV) FISTULA CREATION;  Surgeon: [name], MD;  Location: [place];  Service: Vascular;  Laterality: Left;  . OPEN REDUCTION INTERNAL FIXATION (ORIF) DISTAL RADIAL FRACTURE Left 01/28/2016   Procedure: OPEN REDUCTION INTERNAL FIXATION (ORIF) DISTAL RADIAL FRACTURE;  Surgeon: Iran Planas, MD;  Location: [place];  Service: Orthopedics;  Laterality: Left;  . [place]     age 41    Family History  Problem Relation Age of Onset  . Stroke Mother   . Diabetes Mother   . Kidney failure Mother   . Heart failure Mother   . Stroke Father   . Cancer Sister        Breast- 62's    SOCIAL HISTORY: Social History   Tobacco Use  . Smoking status: Current Every Day Smoker    Packs/day: 1.00    Years: 32.00    Pack years: 32.00    Types: Cigarettes    Last attempt to quit: 09/23/2012    Years since quitting: 6.5  . Smokeless tobacco:  Never Used  . Tobacco comment: or less  Substance Use Topics  . Alcohol use: No    Allergies  Allergen Reactions  . Hydrocodone Nausea And Vomiting    Current Outpatient Medications  Medication Sig Dispense Refill  . acetaminophen (TYLENOL) 325 MG tablet Take 2 tablets (650 mg total) by mouth every 4 (four) hours as needed. 100 tablet 2  . amLODipine (NORVASC) 10 MG tablet Take 10 mg by mouth daily.    Marland Kitchen aspirin 81 MG tablet Take 1 tablet (81 mg total) by mouth daily. 30 tablet 12  . atorvastatin (LIPITOR) 40 MG tablet Take 40 mg by mouth every evening.     . diphenhydrAMINE (BENADRYL) 25 MG tablet Take 25 mg by mouth every 4 (four) hours as needed for itching.    . gabapentin (NEURONTIN) 100 MG capsule Take 100 mg by mouth 2 (two) times daily.    . insulin aspart (NOVOLOG) 100 UNIT/ML injection Inject 8 Units into the skin 3 (three) times daily with meals. 10 mL 0  . insulin glargine (LANTUS) 100 UNIT/ML injection Inject 0.24 mLs (24 Units total) into the skin daily. 10 mL 0  . lidocaine (LIDODERM) 5 % Place 1 patch onto the skin daily. Remove & Discard patch within 12 hours or as directed by MD 30 patch 0  .  metoprolol (LOPRESSOR) 100 MG tablet Take 100 mg by mouth 2 (two) times daily.    . Multiple Vitamins-Minerals (CERTAVITE SENIOR/ANTIOXIDANT) TABS Take 1 tablet by mouth daily.    Marland Kitchen omeprazole (PRILOSEC) 40 MG capsule Take 40 mg by mouth daily.    Marland Kitchen oxyCODONE-acetaminophen (PERCOCET) 5-325 MG tablet Take 1 tablet by mouth every 8 (eight) hours as needed for severe pain. 10 tablet 0  . polyethylene glycol (MIRALAX / GLYCOLAX) packet Take 17 g by mouth daily. 14 each 0  . Probiotic Product (PROBIOTIC PO) Take 1 tablet by mouth 2 (two) times daily.     . ranitidine (ZANTAC) 150 MG tablet Take 1 tablet (150 mg total) by mouth 2 (two) times daily. 30 tablet 0  . senna-docusate (SENOKOT-S) 8.6-50 MG tablet Take 1 tablet by mouth at bedtime as needed for mild constipation. 30 tablet 0  .  sodium bicarbonate 650 MG tablet Take 1,300 mg by mouth 2 (two) times daily.    . traMADol (ULTRAM) 50 MG tablet Take 0.5 tablets (25 mg total) by mouth every 12 (twelve) hours as needed. 20 tablet 0  . Vitamin D, Ergocalciferol, (DRISDOL) 50000 units CAPS capsule Take 1 capsule (50,000 Units total) by mouth every 7 (seven) days. (Patient taking differently: Take 50,000 Units by mouth every Wednesday. ) 30 capsule 0   No current facility-administered medications for this visit.     REVIEW OF SYSTEMS:  [X]  denotes positive finding, [ ]  denotes negative finding Cardiac  Comments:  Chest pain or chest pressure:    Shortness of breath upon exertion:    Short of breath when lying flat:    Irregular heart rhythm:        Vascular    Pain in calf, thigh, or hip brought on by ambulation:    Pain in feet at night that wakes you up from your sleep:     Blood clot in your veins:    Leg swelling:         Pulmonary    Oxygen at home:    Productive cough:     Wheezing:         Neurologic    Sudden weakness in arms or legs:     Sudden numbness in arms or legs:     Sudden onset of difficulty speaking or slurred speech:    Temporary loss of vision in one eye:     Problems with dizziness:         Gastrointestinal    Blood in stool:     Vomited blood:         Genitourinary    Burning when urinating:     Blood in urine:        Psychiatric    Major depression:         Hematologic    Bleeding problems:    Problems with blood clotting too easily:        Skin    Rashes or ulcers:        Constitutional    Fever or chills:      PHYSICAL EXAM: Vitals:   03/29/19 1543  BP: 130/65  Pulse: 67  Resp: 14  Temp: (!) 96.5 F (35.8 C)  TempSrc: Temporal  SpO2: 97%  Weight: 105 lb (47.6 kg)  Height: 4\' 6"  (1.372 m)    GENERAL: The patient is a well-nourished female, in no acute distress. The vital signs are documented above. CARDIAC: There is a regular rate and rhythm.  VASCULAR:   Left brachiobasilic fistula with good thrill Left radial pulse palpable at the wrist No wounds in the left hand Left hand motor or sensory intact  DATA:   I reviewed her fistula duplex and she has a nicely maturing first stage brachiobasilic fistula in the left arm that is widely patent.  Assessment/Plan:  58 year old female status post left first stage brachiobasilic fistula on [id number] that appears to be maturing nicely.  Vein is over 7 mm now.  Discussed plans to proceed with second stage transposition.  We discussed the procedure in detail as well as risks and benefits including steal, failure to mature, fistula failure, bleeding and infection.  We will schedule at [facility]   [name], MD [facility] [place] Office: [phone] Pager: [phone]

## 2019-04-07 ENCOUNTER — Other Ambulatory Visit (HOSPITAL_COMMUNITY): Payer: Self-pay | Admitting: *Deleted

## 2019-04-08 ENCOUNTER — Encounter (HOSPITAL_COMMUNITY): Payer: Medicare Other

## 2019-04-08 ENCOUNTER — Encounter (HOSPITAL_COMMUNITY): Payer: Self-pay | Admitting: *Deleted

## 2019-04-08 ENCOUNTER — Encounter (HOSPITAL_COMMUNITY): Payer: Self-pay

## 2019-04-08 NOTE — Progress Notes (Signed)
Spoke with Guss Bunde, Med Tech and [employer], Resident Care Director @ [place] regarding patient's information for DOS.  States that patient does not have  shortness of breath, fever, cough or chest pain.  PCP - Dr. Cipriano Bunker at [facility] Cardiologist - Denies Nephrologist-[facility]  Chest x-ray - 06/10/18 EKG - 10/13/18 Stress Test - 02/15/03 ECHO - 06/10/18 Cardiac Cath - denies  Fasting Blood Sugar - 170s Checks Blood Sugar_3_ times a day  . THE NIGHT BEFORE SURGERY, do not take Humalog bedtime dose.  . THE MORNING OF SURGERY, Do not take Humalog Insulin unless CBG is greater than 220 mg/dL, you may take  of your sliding scale (correction) dose of insulin.  . If your blood sugar is less than 70 mg/dL, you will need to treat for low blood sugar: o Treat a low blood sugar (less than 70 mg/dL) with  cup of clear juice (cranberry or apple), 4 glucose tablets, OR glucose gel. o Recheck blood sugar in 15 minutes after treatment (to make sure it is greater than 70 mg/dL). If your blood sugar is not greater than 70 mg/dL on recheck, call [phone] for further instructions.  Aspirin Instructions: Take aspirin on DOS per MD.   Anesthesia review: Yes, Ebony Hail, PA  STOP now taking any Aspirin (unless otherwise instructed by your surgeon), Aleve, Naproxen, Ibuprofen, Motrin, Advil, Goody's, BC's, all herbal medications, fish oil, and all vitamins.   Coronavirus Screening Has patient experienced the following symptoms:  Cough yes/no: No Fever (>100.40F)  yes/no: No Runny nose yes/no: No Sore throat yes/no: No Difficulty breathing/shortness of breath  yes/no: No  Has patient traveled in the last 14 days and where? yes/no: No  Wells Guiles verbalized understanding of instructions that were given via phone and instructions faxed.

## 2019-04-08 NOTE — Progress Notes (Signed)
Anesthesia Chart Review: SAME DAY WORK-UP   Case: [id number] Date/Time: 04/11/19 0910   Procedure: SECOND STAGE BASILIC VEIN TRANSPOSITION LEFT ARM (Left )   Anesthesia type: Choice   Pre-op diagnosis: CHRONIC KIDNEY DISEASE FOR HEMODIALYSIS ACCESS   Location: [place] OR ROOM 12 / [place] OR   Surgeon: [name], MD      DISCUSSION: Patient is a 58 year old female scheduled for the above procedure. She is s/p first stage left basilic vein transposition AVF 02/21/19.   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 She is not yet on hemodialysis.   Patient is a resident at [facility].   She is a same-day work-up. Anesthesia team evaluation on the day of surgery.  VS: LMP 03/06/2013   BP Readings from Last 3 Encounters:  03/29/19 130/65  02/21/19 133/63  02/08/19 136/77    PROVIDERS: Charlott Rakes, MD is listed as PCP, but last visit seen is from 04/29/16.  Nephrologist is with [facility]   LABS: For day of surgery.   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 Image quality is degraded on the lateral radiograph due to the patient's inability to raise the right arm. No airspace consolidation, edema, pleural effusion, or pneumothorax is identified. No acute osseous abnormality is seen. IMPRESSION: No active cardiopulmonary disease.  MRA neck 03/28/11: IMPRESSION: Unremarkable MR angiography extracranial circulation.   EKG: 10/12/18: NSR   CV: Echo 06/10/18: Study Conclusions - Left ventricle: The cavity size was normal. Wall thickness was normal. Systolic function was normal. The estimated ejection fraction was in the range of 60% to 65%. Wall motion was normal; there were no regional  wall motion abnormalities. Features are consistent with a pseudonormal left ventricular filling pattern, with concomitant abnormal relaxation and increased filling pressure (grade 2 diastolic dysfunction). Doppler parameters are consistent with high ventricular filling pressure. - Aortic valve: Transvalvular velocity was within the normal range. There was no stenosis. There was no regurgitation. - Mitral valve: There was mild regurgitation. - Left atrium: The atrium was normal in size. - Right ventricle: The cavity size was normal. Wall thickness was normal. Systolic function was normal. RV systolic pressure (S, est): 37 mm Hg. - Right atrium: The atrium was normal in size. - Tricuspid valve: There was mild regurgitation. - Pulmonic valve: There was trivial regurgitation. - Pulmonary arteries: Systolic pressure was mildly to moderately increased. PA peak pressure: 37 mm Hg (S). - Inferior vena cava: The vessel was normal in size. The respirophasic diameter changes were in the normal range (>= 50%), consistent with normal central venous pressure. - Pericardium, extracardiac: There was no pericardial effusion.  Remote history of non-ischemic stress test in 2004.   Past Medical History:  Diagnosis Date  . CKD (chronic kidney disease)   . Diabetes mellitus   . Hyperlipidemia   . Hypertension   . Stroke [facility]) 04-01-11   left frontal subcortical, saw Dr. [name]   . TIA (transient ischemic attack) 03-12-11    Past Surgical History:  Procedure Laterality Date  . AV FISTULA PLACEMENT Left 02/21/2019   Procedure: BRACHIOCEPHALIC ARTERIOVENOUS (AV) FISTULA CREATION;  Surgeon: [name], MD;  Location: [place];  Service: Vascular;  Laterality: Left;  . OPEN REDUCTION INTERNAL FIXATION (ORIF) DISTAL RADIAL FRACTURE Left 01/28/2016   Procedure: OPEN REDUCTION INTERNAL FIXATION (ORIF)  DISTAL RADIAL FRACTURE;  Surgeon: Iran Planas, MD;  Location: [place];  Service:  Orthopedics;  Laterality: Left;  . [place]     age 70    MEDICATIONS: No current facility-administered medications for this encounter.    Marland Kitchen acetaminophen (TYLENOL) 325 MG tablet  . amLODipine (NORVASC) 10 MG tablet  . aspirin 81 MG tablet  . atorvastatin (LIPITOR) 40 MG tablet  . diphenhydrAMINE (BENADRYL) 25 MG tablet  . famotidine (PEPCID) 40 MG tablet  . FLUoxetine (PROZAC) 10 MG capsule  . gabapentin (NEURONTIN) 100 MG capsule  . insulin lispro (HUMALOG) 100 UNIT/ML injection  . metoprolol (LOPRESSOR) 100 MG tablet  . Multiple Vitamins-Minerals (CERTAVITE SENIOR/ANTIOXIDANT) TABS  . omeprazole (PRILOSEC) 40 MG capsule  . polyethylene glycol (MIRALAX / GLYCOLAX) packet  . SACCHAROMYCES BOULARDII PO  . senna-docusate (SENOKOT-S) 8.6-50 MG tablet  . sodium bicarbonate 650 MG tablet  . Vitamin D, Ergocalciferol, (DRISDOL) 50000 units CAPS capsule     Myra Gianotti, PA-C Surgical Short Stay/Anesthesiology [facility] Phone [phone] [facility] Phone [phone] 04/08/2019 11:29 AM

## 2019-04-08 NOTE — Anesthesia Preprocedure Evaluation (Addendum)
Anesthesia Evaluation  Patient identified by MRN, date of birth, ID band Patient awake    Reviewed: Allergy & Precautions, NPO status , Patient's Chart, lab work & pertinent test results  Airway Mallampati: I  TM Distance: >3 FB Neck ROM: Full    Dental   Pulmonary Current Smoker,    Pulmonary exam normal        Cardiovascular hypertension, Pt. on medications +CHF  Normal cardiovascular exam     Neuro/Psych Depression CVA    GI/Hepatic GERD  Medicated and Controlled,  Endo/Other  diabetes, Type 2, Insulin Dependent  Renal/GU Renal InsufficiencyRenal disease     Musculoskeletal   Abdominal   Peds  Hematology   Anesthesia Other Findings   Reproductive/Obstetrics                           Anesthesia Physical Anesthesia Plan  ASA: III  Anesthesia Plan: General   Post-op Pain Management:    Induction: Intravenous  PONV Risk Score and Plan: 2 and Ondansetron and Midazolam  Airway Management Planned: LMA  Additional Equipment:   Intra-op Plan:   Post-operative Plan: Extubation in OR  Informed Consent: I have reviewed the patients History and Physical, chart, labs and discussed the procedure including the risks, benefits and alternatives for the proposed anesthesia with the patient or authorized representative who has indicated his/her understanding and acceptance.       Plan Discussed with: CRNA and Surgeon  Anesthesia Plan Comments: (PAT note written 04/08/2019 by Myra Gianotti, PA-C. SAME DAY WORK-UP   )       Anesthesia Quick Evaluation

## 2019-04-08 NOTE — Progress Notes (Signed)
No Pharmacies Listed     Your procedure is scheduled on Monday, 04/11/19.  Report to [facility] Main Entrance "A" at 6:25 A.M., and check in at the Admitting office.  Call this number if you have problems the morning of surgery:  [phone]  Call [phone] if you have any questions prior to your surgery date Monday-Friday 8am-4pm    Remember:  Do not eat or drink after midnight the night before your surgery Sunday   Take these medicines the morning of surgery with A SIP OF WATER amlodipine, aspirin, pepcid, prozac, gabapentin, metoprolol, prilosec, and tylenol if needed.   THE NIGHT BEFORE SURGERY, do not take Humalog bedtime dose.   THE MORNING OF SURGERY, Do not take Humalog Insulin unless CBG is greater than 220 mg/dL, you may take  of your sliding scale (correction) dose of insulin.   If your blood sugar is less than 70 mg/dL, you will need to treat for low blood sugar: ? Treat a low blood sugar (less than 70 mg/dL) with  cup of clear juice (cranberry or apple), 4glucose tablets, OR glucose gel. ? Recheck blood sugar in 15 minutes after treatment (to make sure it is greater than 70 mg/dL). If your blood sugar is not greater than 70 mg/dL on recheck, call [phone] for further instructions.  STOP now taking any Aspirin (unless otherwise instructed by your surgeon), Aleve, Naproxen, Ibuprofen, Motrin, Advil, Goody's, BC's, all herbal medications, fish oil, and all vitamins.    The Morning of Surgery  Do not wear jewelry, make-up or nail polish.  Do not wear lotions, powders,  perfumes, or deodorant  Do not shave 48 hours prior to surgery.    Do not bring valuables to the hospital.  [facility] is not responsible for any belongings or valuables.  If you are a smoker, DO NOT Smoke 24 hours prior to surgery  IF you wear a CPAP at night please bring your mask, tubing, and machine the morning of surgery   Remember that you must have someone to transport you home  after your surgery, and remain with you for 24 hours if you are discharged the same day. Patients discharged the day of surgery will not be allowed to drive home.   Contacts, glasses, hearing aids, dentures or bridgework may not be worn into surgery.   For patients admitted to the hospital, discharge time will be determined by your treatment team.   Special instructions:   [facility]- Preparing For Surgery  Oral Hygiene is also important to reduce your risk of infection.  Remember - BRUSH YOUR TEETH THE MORNING OF SURGERY WITH YOUR REGULAR TOOTHPASTE  Day of Surgery:  Do not apply any deodorants/lotions.  Please wear clean clothes to the hospital/surgery center.    Remember to brush your teeth WITH YOUR REGULAR TOOTHPASTE

## 2019-04-11 ENCOUNTER — Encounter (HOSPITAL_COMMUNITY): Payer: Self-pay | Admitting: Surgery

## 2019-04-11 ENCOUNTER — Ambulatory Visit (HOSPITAL_COMMUNITY)
Admission: RE | Admit: 2019-04-11 | Discharge: 2019-04-11 | Disposition: A | Discharge status: Home / Self Care | Payer: Medicare Other | Attending: Vascular Surgery | Admitting: Vascular Surgery

## 2019-04-11 ENCOUNTER — Ambulatory Visit (HOSPITAL_COMMUNITY): Payer: Medicare Other | Admitting: Vascular Surgery

## 2019-04-11 ENCOUNTER — Other Ambulatory Visit: Payer: Self-pay

## 2019-04-11 ENCOUNTER — Encounter (HOSPITAL_COMMUNITY)
Admission: RE | Disposition: A | Discharge status: Home / Self Care | Payer: Self-pay | Source: Home / Self Care | Attending: Vascular Surgery

## 2019-04-11 DIAGNOSIS — Z8673 Personal history of transient ischemic attack (TIA), and cerebral infarction without residual deficits: Secondary | ICD-10-CM | POA: Diagnosis not present

## 2019-04-11 DIAGNOSIS — I120 Hypertensive chronic kidney disease with stage 5 chronic kidney disease or end stage renal disease: Secondary | ICD-10-CM | POA: Diagnosis not present

## 2019-04-11 DIAGNOSIS — Z20828 Contact with and (suspected) exposure to other viral communicable diseases: Secondary | ICD-10-CM | POA: Diagnosis not present

## 2019-04-11 DIAGNOSIS — Z7982 Long term (current) use of aspirin: Secondary | ICD-10-CM | POA: Diagnosis not present

## 2019-04-11 DIAGNOSIS — Z79899 Other long term (current) drug therapy: Secondary | ICD-10-CM | POA: Insufficient documentation

## 2019-04-11 DIAGNOSIS — F17210 Nicotine dependence, cigarettes, uncomplicated: Secondary | ICD-10-CM | POA: Insufficient documentation

## 2019-04-11 DIAGNOSIS — E785 Hyperlipidemia, unspecified: Secondary | ICD-10-CM | POA: Insufficient documentation

## 2019-04-11 DIAGNOSIS — N185 Chronic kidney disease, stage 5: Secondary | ICD-10-CM | POA: Diagnosis not present

## 2019-04-11 DIAGNOSIS — E1122 Type 2 diabetes mellitus with diabetic chronic kidney disease: Secondary | ICD-10-CM | POA: Insufficient documentation

## 2019-04-11 DIAGNOSIS — Z794 Long term (current) use of insulin: Secondary | ICD-10-CM | POA: Insufficient documentation

## 2019-04-11 HISTORY — DX: Age-related osteoporosis without current pathological fracture: M81.0

## 2019-04-11 HISTORY — DX: Gastro-esophageal reflux disease without esophagitis: K21.9

## 2019-04-11 HISTORY — DX: Constipation, unspecified: K59.00

## 2019-04-11 HISTORY — DX: Vitamin D deficiency, unspecified: E55.9

## 2019-04-11 HISTORY — PX: BASCILIC VEIN TRANSPOSITION: SHX5742

## 2019-04-11 HISTORY — DX: Dependence on other enabling machines and devices: Z99.89

## 2019-04-11 LAB — POCT I-STAT, CHEM 8
BUN: 54 mg/dL — ABNORMAL HIGH (ref 6–20)
Calcium, Ion: 1.13 mmol/L — ABNORMAL LOW (ref 1.15–1.40)
Chloride: 108 mmol/L (ref 98–111)
Creatinine, Ser: 5.6 mg/dL — ABNORMAL HIGH (ref 0.44–1.00)
Glucose, Bld: 141 mg/dL — ABNORMAL HIGH (ref 70–99)
HCT: 24 % — ABNORMAL LOW (ref 36.0–46.0)
Hemoglobin: 8.2 g/dL — ABNORMAL LOW (ref 12.0–15.0)
Potassium: 4.9 mmol/L (ref 3.5–5.1)
Sodium: 142 mmol/L (ref 135–145)
TCO2: 22 mmol/L (ref 22–32)

## 2019-04-11 LAB — SARS CORONAVIRUS 2 BY RT PCR (HOSPITAL ORDER, PERFORMED IN ~~LOC~~ HOSPITAL LAB): SARS Coronavirus 2: NEGATIVE

## 2019-04-11 LAB — GLUCOSE, CAPILLARY
Glucose-Capillary: 137 mg/dL — ABNORMAL HIGH (ref 70–99)
Glucose-Capillary: 161 mg/dL — ABNORMAL HIGH (ref 70–99)

## 2019-04-11 SURGERY — TRANSPOSITION, VEIN, BASILIC
Anesthesia: General | Site: Arm Upper | Laterality: Left

## 2019-04-11 MED ORDER — ONDANSETRON HCL 4 MG/2ML IJ SOLN: 4.0000 mg | Freq: Once | INTRAMUSCULAR | Status: DC | PRN

## 2019-04-11 MED ORDER — OXYCODONE HCL 5 MG PO TABS: 5.0000 mg | ORAL_TABLET | Freq: Three times a day (TID) | ORAL | 0 refills | Status: DC | PRN

## 2019-04-11 MED ORDER — SODIUM CHLORIDE 0.9 % IV SOLN
INTRAVENOUS | Status: DC
  Administered 2019-04-11: 10:00:00 via INTRAVENOUS

## 2019-04-11 MED ORDER — SODIUM CHLORIDE 0.9 % IV SOLN
INTRAVENOUS | Status: DC | PRN
  Administered 2019-04-11: 11:00:00 50 ug/min via INTRAVENOUS

## 2019-04-11 MED ORDER — ONDANSETRON HCL 4 MG/2ML IJ SOLN
INTRAMUSCULAR | Status: DC | PRN
  Administered 2019-04-11: 4 mg via INTRAVENOUS

## 2019-04-11 MED ORDER — SODIUM CHLORIDE 0.9 % IV SOLN
INTRAVENOUS | Status: DC | PRN
  Administered 2019-04-11: 500 mL

## 2019-04-11 MED ORDER — 0.9 % SODIUM CHLORIDE (POUR BTL) OPTIME
TOPICAL | Status: DC | PRN
  Administered 2019-04-11: 1000 mL

## 2019-04-11 MED ORDER — LIDOCAINE HCL (CARDIAC) PF 100 MG/5ML IV SOSY
PREFILLED_SYRINGE | INTRAVENOUS | Status: DC | PRN
  Administered 2019-04-11: 40 mg via INTRAVENOUS

## 2019-04-11 MED ORDER — MIDAZOLAM HCL 2 MG/2ML IJ SOLN
INTRAMUSCULAR | Status: AC
Start: 2019-04-11 — End: ?
  Filled 2019-04-11: qty 2

## 2019-04-11 MED ORDER — MIDAZOLAM HCL 5 MG/5ML IJ SOLN
INTRAMUSCULAR | Status: DC | PRN
  Administered 2019-04-11: 1 mg via INTRAVENOUS

## 2019-04-11 MED ORDER — CHLORHEXIDINE GLUCONATE 4 % EX LIQD: 60.0000 mL | Freq: Once | CUTANEOUS | Status: DC

## 2019-04-11 MED ORDER — LIDOCAINE HCL (PF) 1 % IJ SOLN
INTRAMUSCULAR | Status: AC
Start: 2019-04-11 — End: ?
  Filled 2019-04-11: qty 30

## 2019-04-11 MED ORDER — PROTAMINE SULFATE 10 MG/ML IV SOLN
INTRAVENOUS | Status: DC | PRN
  Administered 2019-04-11 (×2): 10 mg via INTRAVENOUS

## 2019-04-11 MED ORDER — HEPARIN SODIUM (PORCINE) 1000 UNIT/ML IJ SOLN
INTRAMUSCULAR | Status: DC | PRN
  Administered 2019-04-11: 3000 [IU] via INTRAVENOUS

## 2019-04-11 MED ORDER — FENTANYL CITRATE (PF) 250 MCG/5ML IJ SOLN
INTRAMUSCULAR | Status: DC | PRN
  Administered 2019-04-11 (×2): 50 ug via INTRAVENOUS

## 2019-04-11 MED ORDER — HYDROMORPHONE HCL 1 MG/ML IJ SOLN: 0.2500 mg | INTRAMUSCULAR | Status: DC | PRN

## 2019-04-11 MED ORDER — FENTANYL CITRATE (PF) 250 MCG/5ML IJ SOLN
INTRAMUSCULAR | Status: AC
  Filled 2019-04-11: qty 5

## 2019-04-11 MED ORDER — CEFAZOLIN SODIUM-DEXTROSE 2-4 GM/100ML-% IV SOLN
2.0000 g | INTRAVENOUS | Status: AC
  Administered 2019-04-11: 2 g via INTRAVENOUS
  Filled 2019-04-11: qty 100

## 2019-04-11 MED ORDER — PROPOFOL 10 MG/ML IV BOLUS
INTRAVENOUS | Status: DC | PRN
  Administered 2019-04-11: 80 mg via INTRAVENOUS
  Administered 2019-04-11: 70 mg via INTRAVENOUS

## 2019-04-11 MED ORDER — MEPERIDINE HCL 25 MG/ML IJ SOLN: 6.2500 mg | INTRAMUSCULAR | Status: DC | PRN

## 2019-04-11 MED ORDER — PROTAMINE SULFATE 10 MG/ML IV SOLN
INTRAVENOUS | Status: AC
  Filled 2019-04-11: qty 5

## 2019-04-11 MED ORDER — NALOXONE HCL 0.4 MG/ML IJ SOLN
INTRAMUSCULAR | Status: DC | PRN
  Administered 2019-04-11: 120 ug via INTRAVENOUS
  Administered 2019-04-11: 40 ug via INTRAVENOUS

## 2019-04-11 MED ORDER — SODIUM CHLORIDE 0.9 % IV SOLN
INTRAVENOUS | Status: AC
  Filled 2019-04-11: qty 1.2

## 2019-04-11 MED ORDER — HEPARIN SODIUM (PORCINE) 1000 UNIT/ML IJ SOLN
INTRAMUSCULAR | Status: AC
  Filled 2019-04-11: qty 1

## 2019-04-11 SURGICAL SUPPLY — 36 items
ARMBAND PINK RESTRICT EXTREMIT (MISCELLANEOUS) ×3 IMPLANT
CANISTER SUCT 3000ML PPV (MISCELLANEOUS) ×3 IMPLANT
CLIP VESOCCLUDE MED 24/CT (CLIP) ×3 IMPLANT
CLIP VESOCCLUDE SM WIDE 24/CT (CLIP) ×3 IMPLANT
COVER PROBE W GEL 5X96 (DRAPES) ×3 IMPLANT
COVER WAND RF STERILE (DRAPES) ×3 IMPLANT
DECANTER SPIKE VIAL GLASS SM (MISCELLANEOUS) IMPLANT
DERMABOND ADVANCED (GAUZE/BANDAGES/DRESSINGS) ×4
DERMABOND ADVANCED .7 DNX12 (GAUZE/BANDAGES/DRESSINGS) ×2 IMPLANT
ELECT REM PT RETURN 9FT ADLT (ELECTROSURGICAL) ×3
ELECTRODE REM PT RTRN 9FT ADLT (ELECTROSURGICAL) ×1 IMPLANT
GLOVE BIO SURGEON STRL SZ7.5 (GLOVE) ×3 IMPLANT
GLOVE BIOGEL PI IND STRL 8 (GLOVE) ×1 IMPLANT
GLOVE BIOGEL PI INDICATOR 8 (GLOVE) ×2
GOWN STRL REUS W/ TWL LRG LVL3 (GOWN DISPOSABLE) ×2 IMPLANT
GOWN STRL REUS W/ TWL XL LVL3 (GOWN DISPOSABLE) ×2 IMPLANT
GOWN STRL REUS W/TWL LRG LVL3 (GOWN DISPOSABLE) ×4
GOWN STRL REUS W/TWL XL LVL3 (GOWN DISPOSABLE) ×4
HEMOSTAT SPONGE AVITENE ULTRA (HEMOSTASIS) IMPLANT
KIT BASIN OR (CUSTOM PROCEDURE TRAY) ×3 IMPLANT
KIT TURNOVER KIT B (KITS) ×3 IMPLANT
NEEDLE HYPO 25GX1X1/2 BEV (NEEDLE) ×3 IMPLANT
NS IRRIG 1000ML POUR BTL (IV SOLUTION) ×3 IMPLANT
PACK CV ACCESS (CUSTOM PROCEDURE TRAY) ×3 IMPLANT
PAD ARMBOARD 7.5X6 YLW CONV (MISCELLANEOUS) ×6 IMPLANT
SUT MNCRL AB 4-0 PS2 18 (SUTURE) ×9 IMPLANT
SUT PROLENE 6 0 BV (SUTURE) ×9 IMPLANT
SUT PROLENE 7 0 BV 1 (SUTURE) IMPLANT
SUT SILK 2 0 SH (SUTURE) IMPLANT
SUT VIC AB 2-0 CT1 27 (SUTURE)
SUT VIC AB 2-0 CT1 TAPERPNT 27 (SUTURE) IMPLANT
SUT VIC AB 3-0 SH 27 (SUTURE) ×8
SUT VIC AB 3-0 SH 27X BRD (SUTURE) ×4 IMPLANT
TOWEL GREEN STERILE (TOWEL DISPOSABLE) ×3 IMPLANT
UNDERPAD 30X30 (UNDERPADS AND DIAPERS) ×3 IMPLANT
WATER STERILE IRR 1000ML POUR (IV SOLUTION) ×3 IMPLANT

## 2019-04-11 NOTE — Discharge Instructions (Signed)
° °  [facility]  Discharge Instructions  AV Fistula or Graft Surgery for Dialysis Access  Please refer to the following instructions for your post-procedure care. Your surgeon or physician assistant will discuss any changes with you.  Activity  You may drive the day following your surgery, if you are comfortable and no longer taking prescription pain medication. Resume full activity as the soreness in your incision resolves.  Bathing/Showering  You may shower after you go home. Keep your incision dry for 48 hours. Do not soak in a bathtub, hot tub, or swim until the incision heals completely. You may not shower if you have a hemodialysis catheter.  Incision Care  Clean your incision with mild soap and water after 48 hours. Pat the area dry with a clean towel. You do not need a bandage unless otherwise instructed. Do not apply any ointments or creams to your incision. You may have skin glue on your incision. Do not peel it off. It will come off on its own in about one week. Your arm may swell a bit after surgery. To reduce swelling use pillows to elevate your arm so it is above your heart. Your doctor will tell you if you need to lightly wrap your arm with an ACE bandage.  Diet  Resume your normal diet. There are not special food restrictions following this procedure. In order to heal from your surgery, it is CRITICAL to get adequate nutrition. Your body requires vitamins, minerals, and protein. Vegetables are the best source of vitamins and minerals. Vegetables also provide the perfect balance of protein. Processed food has little nutritional value, so try to avoid this.  Medications  Resume taking all of your medications. If your incision is causing pain, you may take over-the counter pain relievers such as acetaminophen (Tylenol). If you were prescribed a stronger pain medication, please be aware these medications can cause nausea and constipation. Prevent  nausea by taking the medication with a snack or meal. Avoid constipation by drinking plenty of fluids and eating foods with high amount of fiber, such as fruits, vegetables, and grains.  Do not take Tylenol if you are taking prescription pain medications.  Follow up Your surgeon may want to see you in the office following your access surgery. If so, this will be arranged at the time of your surgery.  Please call us immediately for any of the following conditions:  Increased pain, redness, drainage (pus) from your incision site Fever of 101 degrees or higher Severe or worsening pain at your incision site Hand pain or numbness.  Reduce your risk of vascular disease:  Stop smoking. If you would like help, call [facility] at 1-800-QUIT-NOW 323 685 1998) or [place] [facility] [place] your cholesterol Maintain a desired weight Control your diabetes Keep your blood pressure down  Dialysis  It will take several weeks to several months for your new dialysis access to be ready for use. Your surgeon will determine when it is okay to use it. Your nephrologist will continue to direct your dialysis. You can continue to use your Permcath until your new access is ready for use.   04/11/2019 ORVETTA BARRIENTEZ [id number] 1960-09-24  Surgeon(s): [name], MD  Procedure(s): SECOND STAGE BASILIC VEIN TRANSPOSITION LEFT ARM  x Do not stick fistula for 6 weeks    If you have any questions, please call the office at [phone].

## 2019-04-11 NOTE — Op Note (Signed)
    OPERATIVE NOTE   PROCEDURE: left second stage basilic vein transposition (brachiobasilic arteriovenous fistula) placement  PRE-OPERATIVE DIAGNOSIS: Stage V CKD  POST-OPERATIVE DIAGNOSIS: Same  SURGEON: [name], MD  ASSISTANT(S): Leontine Locket, PA  ANESTHESIA: General  ESTIMATED BLOOD LOSS: Minimal  FINDING(S): Nicely dilated basilic vein 6-7 mm.  After transposition excellent thrill palpable.  SPECIMEN(S):  None  INDICATIONS:   [name] Michael is a 58 y.o. female who presents with stage V CKD.  The patient is scheduled for left second stage basilic vein transposition.  The patient is aware the risks include but are not limited to: bleeding, infection, steal syndrome, nerve damage, ischemic monomelic neuropathy, failure to mature, and need for additional procedures.  The patient is aware of the risks of the procedure and elects to proceed forward.   DESCRIPTION: After full informed written consent was obtained from the patient, the patient was brought back to the operating room and placed supine upon the operating table.  Prior to induction, the patient received IV antibiotics.   After obtaining adequate anesthesia, the patient was then prepped and draped in the standard fashion for a left arm access procedure.  I turned my attention first to identifying the patient's brachiobasilic arteriovenous fistula.  Using SonoSite guidance, the location of this fistula was marked out on the skin.    This was an excellent caliber vein.  I made three longitudinal incisions on the medial aspect of the left upper arm.  Through these incisions I dissected out circumferentially the basilic vein, taking care to protect the nerve.  Once the vein was fully mobilized, all side branches were ligated between silk ties.  The vein was marked for orientation.  I then used a curved tunneler to create a subcutaneous tunnel.  The vein was then transected near the antecubital crease.  It was then  brought to the previously created tunnel making sure to maintain proper orientation.  A primary anastomosis was then performed between the two cut ends of the vein with a running 6-0 Prolene.  Once this was done the clamps were released.  There was excellent flow through the fistula.  Hemostasis was then achieved.  The wound was irrigated.  The incision was closed with a deep layer of 3-0 Vicryl followed by a subcutaneous 4-0 Monocryl and Dermabond.  There were no immediate complications.  COMPLICATIONS: None  CONDITION: Stable  [name], MD [facility] [place] Office: [phone] Pager: [phone]  04/11/2019, 12:26 PM

## 2019-04-11 NOTE — Anesthesia Procedure Notes (Signed)
Procedure Name: LMA Insertion Date/Time: 04/11/2019 11:00 AM Performed by: Mariea Clonts, CRNA Pre-anesthesia Checklist: Patient identified, Emergency Drugs available, Suction available and Patient being monitored Patient Re-evaluated:Patient Re-evaluated prior to induction Oxygen Delivery Method: Circle System Utilized Preoxygenation: Pre-oxygenation with 100% oxygen Induction Type: IV induction Ventilation: Two handed mask ventilation required, Oral airway inserted - appropriate to patient size and Mask ventilation with difficulty LMA: LMA inserted LMA Size: 3.0 Number of attempts: 1 Airway Equipment and Method: Bite block Placement Confirmation: positive ETCO2 Tube secured with: Tape Dental Injury: Teeth and Oropharynx as per pre-operative assessment  Comments: Inadequate tidal volumes with lma

## 2019-04-11 NOTE — Transfer of Care (Signed)
Immediate Anesthesia Transfer of Care Note  Patient: [name] Michael  Procedure(s) Performed: SECOND STAGE BASILIC VEIN TRANSPOSITION LEFT ARM (Left Arm Upper)  Patient Location: PACU  Anesthesia Type:General  Level of Consciousness: awake, alert  and oriented  Airway & Oxygen Therapy: Patient Spontanous Breathing and Patient connected to face mask oxygen  Post-op Assessment: Report given to RN, Post -op Vital signs reviewed and stable and Patient moving all extremities X 4  Post vital signs: Reviewed and stable  Last Vitals:  Vitals Value Taken Time  BP 139/77 04/11/19 1300  Temp    Pulse 66 04/11/19 1311  Resp 11 04/11/19 1311  SpO2 94 % 04/11/19 1311  Vitals shown include unvalidated device data.  Last Pain:  Vitals:   04/11/19 1300  TempSrc:   PainSc: Asleep      Patients Stated Pain Goal: 2 ([id number])  Complications: No apparent anesthesia complications

## 2019-04-11 NOTE — Anesthesia Postprocedure Evaluation (Signed)
Anesthesia Post Note  Patient: [name] Michael  Procedure(s) Performed: SECOND STAGE BASILIC VEIN TRANSPOSITION LEFT ARM (Left Arm Upper)     Patient location during evaluation: PACU Anesthesia Type: General Level of consciousness: awake and alert Pain management: pain level controlled Vital Signs Assessment: post-procedure vital signs reviewed and stable Respiratory status: spontaneous breathing, nonlabored ventilation, respiratory function stable and patient connected to nasal cannula oxygen Cardiovascular status: blood pressure returned to baseline and stable Postop Assessment: no apparent nausea or vomiting Anesthetic complications: no    Last Vitals:  Vitals:   04/11/19 1345 04/11/19 1400  BP: (!) 152/64 (!) 143/70  Pulse: 65 63  Resp: 12 10  Temp:    SpO2: 98% 97%    Last Pain:  Vitals:   04/11/19 1400  TempSrc:   PainSc: 0-No pain                 Cosandra Plouffe DAVID

## 2019-04-11 NOTE — Anesthesia Procedure Notes (Signed)
Procedure Name: Intubation Date/Time: 04/11/2019 11:08 AM Performed by: Mariea Clonts, CRNA Pre-anesthesia Checklist: Patient identified, Emergency Drugs available, Suction available and Patient being monitored Patient Re-evaluated:Patient Re-evaluated prior to induction Oxygen Delivery Method: Circle System Utilized Preoxygenation: Pre-oxygenation with 100% oxygen Induction Type: IV induction Ventilation: Mask ventilation with difficulty, Oral airway inserted - appropriate to patient size and Two handed mask ventilation required Laryngoscope Size: Glidescope and 3 Grade View: Grade II Tube type: Oral Tube size: 6.5 mm Number of attempts: 1 Airway Equipment and Method: Stylet and Oral airway Placement Confirmation: ETT inserted through vocal cords under direct vision,  positive ETCO2 and breath sounds checked- equal and bilateral Tube secured with: Tape Dental Injury: Teeth and Oropharynx as per pre-operative assessment

## 2019-04-11 NOTE — H&P (Signed)
History and Physical Interval Note:  04/11/2019 10:09 AM  [name] Michael  has presented today for surgery, with the diagnosis of CHRONIC KIDNEY DISEASE FOR HEMODIALYSIS ACCESS.  The various methods of treatment have been discussed with the patient and family. After consideration of risks, benefits and other options for treatment, the patient has consented to  Procedure(s): SECOND STAGE BASILIC VEIN TRANSPOSITION LEFT ARM (Left) as a surgical intervention.  The patient's history has been reviewed, patient examined, no change in status, stable for surgery.  I have reviewed the patient's chart and labs.  Questions were answered to the patient's satisfaction.    Left arm second stage basilic vein fistula.  [name]  Patient name: [name] Michael         MRN: [id number]        DOB: 1961-01-07          Sex: female  REASON FOR VISIT: Postop check after left first stage brachiobasilic fistula  HPI: [name] Michael is a 58 y.o. female with stage V chronic kidney disease not currently on dialysis that presents for postop check after left first stage brachiobasilic fistula.  Fistula was placed on 02/21/2019.  She reports no issues postop.  Her left hand feels okay.  She can feel a thrill.  The incision has healed.  She lives in a assisted living facility.  States she has not started dialysis yet and was told that she has still 10% function in her kidneys.      Past Medical History:  Diagnosis Date  . CKD (chronic kidney disease)   . Diabetes mellitus   . Hyperlipidemia   . Hypertension   . Stroke [facility]) 04-01-11   left frontal subcortical, saw Dr. [name]   . TIA (transient ischemic attack) 03-12-11         Past Surgical History:  Procedure Laterality Date  . AV FISTULA PLACEMENT Left 02/21/2019   Procedure: BRACHIOCEPHALIC ARTERIOVENOUS (AV) FISTULA CREATION;  Surgeon: [name], MD;  Location: [facility];  Service: Vascular;  Laterality: Left;  . OPEN REDUCTION INTERNAL  FIXATION (ORIF) DISTAL RADIAL FRACTURE Left 01/28/2016   Procedure: OPEN REDUCTION INTERNAL FIXATION (ORIF) DISTAL RADIAL FRACTURE;  Surgeon: Iran Planas, MD;  Location: [place];  Service: Orthopedics;  Laterality: Left;  . [place]     age 89         Family History  Problem Relation Age of Onset  . Stroke Mother   . Diabetes Mother   . Kidney failure Mother   . Heart failure Mother   . Stroke Father   . Cancer Sister        Breast- 34's    SOCIAL HISTORY: Social History        Tobacco Use  . Smoking status: Current Every Day Smoker    Packs/day: 1.00    Years: 32.00    Pack years: 32.00    Types: Cigarettes    Last attempt to quit: 09/23/2012    Years since quitting: 6.5  . Smokeless tobacco: Never Used  . Tobacco comment: or less  Substance Use Topics  . Alcohol use: No        Allergies  Allergen Reactions  . Hydrocodone Nausea And Vomiting          Current Outpatient Medications  Medication Sig Dispense Refill  . acetaminophen (TYLENOL) 325 MG tablet Take 2 tablets (650 mg total) by mouth  every 4 (four) hours as needed. 100 tablet 2  . amLODipine (NORVASC) 10 MG tablet Take 10 mg by mouth daily.    Marland Kitchen aspirin 81 MG tablet Take 1 tablet (81 mg total) by mouth daily. 30 tablet 12  . atorvastatin (LIPITOR) 40 MG tablet Take 40 mg by mouth every evening.     . diphenhydrAMINE (BENADRYL) 25 MG tablet Take 25 mg by mouth every 4 (four) hours as needed for itching.    . gabapentin (NEURONTIN) 100 MG capsule Take 100 mg by mouth 2 (two) times daily.    . insulin aspart (NOVOLOG) 100 UNIT/ML injection Inject 8 Units into the skin 3 (three) times daily with meals. 10 mL 0  . insulin glargine (LANTUS) 100 UNIT/ML injection Inject 0.24 mLs (24 Units total) into the skin daily. 10 mL 0  . lidocaine (LIDODERM) 5 % Place 1 patch onto the skin daily. Remove & Discard  patch within 12 hours or as directed by MD 30 patch 0  . metoprolol (LOPRESSOR) 100 MG tablet Take 100 mg by mouth 2 (two) times daily.    . Multiple Vitamins-Minerals (CERTAVITE SENIOR/ANTIOXIDANT) TABS Take 1 tablet by mouth daily.    Marland Kitchen omeprazole (PRILOSEC) 40 MG capsule Take 40 mg by mouth daily.    Marland Kitchen oxyCODONE-acetaminophen (PERCOCET) 5-325 MG tablet Take 1 tablet by mouth every 8 (eight) hours as needed for severe pain. 10 tablet 0  . polyethylene glycol (MIRALAX / GLYCOLAX) packet Take 17 g by mouth daily. 14 each 0  . Probiotic Product (PROBIOTIC PO) Take 1 tablet by mouth 2 (two) times daily.     . ranitidine (ZANTAC) 150 MG tablet Take 1 tablet (150 mg total) by mouth 2 (two) times daily. 30 tablet 0  . senna-docusate (SENOKOT-S) 8.6-50 MG tablet Take 1 tablet by mouth at bedtime as needed for mild constipation. 30 tablet 0  . sodium bicarbonate 650 MG tablet Take 1,300 mg by mouth 2 (two) times daily.    . traMADol (ULTRAM) 50 MG tablet Take 0.5 tablets (25 mg total) by mouth every 12 (twelve) hours as needed. 20 tablet 0  . Vitamin D, Ergocalciferol, (DRISDOL) 50000 units CAPS capsule Take 1 capsule (50,000 Units total) by mouth every 7 (seven) days. (Patient taking differently: Take 50,000 Units by mouth every Wednesday. ) 30 capsule 0   No current facility-administered medications for this visit.     REVIEW OF SYSTEMS:  [X]  denotes positive finding, [ ]  denotes negative finding Cardiac  Comments:  Chest pain or chest pressure:    Shortness of breath upon exertion:    Short of breath when lying flat:    Irregular heart rhythm:        Vascular    Pain in calf, thigh, or hip brought on by ambulation:    Pain in feet at night that wakes you up from your sleep:     Blood clot in your veins:    Leg swelling:         Pulmonary    Oxygen at home:    Productive cough:     Wheezing:         Neurologic    Sudden weakness in  arms or legs:     Sudden numbness in arms or legs:     Sudden onset of difficulty speaking or slurred speech:    Temporary loss of vision in one eye:     Problems with dizziness:  Gastrointestinal    Blood in stool:     Vomited blood:         Genitourinary    Burning when urinating:     Blood in urine:        Psychiatric    Major depression:         Hematologic    Bleeding problems:    Problems with blood clotting too easily:        Skin    Rashes or ulcers:        Constitutional    Fever or chills:      PHYSICAL EXAM:    Vitals:   03/29/19 1543  BP: 130/65  Pulse: 67  Resp: 14  Temp: (!) 96.5 F (35.8 C)  TempSrc: Temporal  SpO2: 97%  Weight: 105 lb (47.6 kg)  Height: 4\' 6"  (1.372 m)    GENERAL: The patient is a well-nourished female, in no acute distress. The vital signs are documented above. CARDIAC: There is a regular rate and rhythm.  VASCULAR:  Left brachiobasilic fistula with good thrill Left radial pulse palpable at the wrist No wounds in the left hand Left hand motor or sensory intact  DATA:   I reviewed her fistula duplex and she has a nicely maturing first stage brachiobasilic fistula in the left arm that is widely patent.  Assessment/Plan:  58 year old female status post left first stage brachiobasilic fistula on [id number] that appears to be maturing nicely.  Vein is over 7 mm now.  Discussed plans to proceed with second stage transposition.  We discussed the procedure in detail as well as risks and benefits including steal, failure to mature, fistula failure, bleeding and infection.  We will schedule at [facility]   [name], MD [facility] [place] Office: [phone] Pager: [phone]

## 2019-04-12 ENCOUNTER — Encounter (HOSPITAL_COMMUNITY): Payer: Self-pay | Admitting: Vascular Surgery

## 2019-04-15 ENCOUNTER — Encounter (HOSPITAL_COMMUNITY): Payer: Medicare Other

## 2019-04-19 ENCOUNTER — Other Ambulatory Visit (HOSPITAL_COMMUNITY): Payer: Self-pay | Admitting: Internal Medicine

## 2019-04-19 DIAGNOSIS — N185 Chronic kidney disease, stage 5: Secondary | ICD-10-CM

## 2019-04-22 ENCOUNTER — Encounter (HOSPITAL_COMMUNITY): Payer: Medicare Other

## 2019-04-27 ENCOUNTER — Other Ambulatory Visit: Payer: Self-pay | Admitting: Radiology

## 2019-04-28 ENCOUNTER — Encounter (HOSPITAL_COMMUNITY): Payer: Self-pay

## 2019-04-28 ENCOUNTER — Inpatient Hospital Stay (HOSPITAL_COMMUNITY)
Admission: EM | Admit: 2019-04-28 | Discharge: 2019-05-04 | DRG: 640 | Disposition: A | Discharge status: Skilled Nursing Facility | Payer: Medicare Other | Attending: Internal Medicine | Admitting: Internal Medicine

## 2019-04-28 ENCOUNTER — Ambulatory Visit (HOSPITAL_COMMUNITY)
Admission: RE | Admit: 2019-04-28 | Discharge: 2019-04-28 | Disposition: A | Discharge status: Home / Self Care | Payer: Medicare Other | Source: Ambulatory Visit | Attending: Internal Medicine | Admitting: Internal Medicine

## 2019-04-28 ENCOUNTER — Other Ambulatory Visit: Payer: Self-pay

## 2019-04-28 ENCOUNTER — Emergency Department (HOSPITAL_COMMUNITY): Payer: Medicare Other

## 2019-04-28 ENCOUNTER — Inpatient Hospital Stay (HOSPITAL_COMMUNITY): Payer: Medicare Other

## 2019-04-28 DIAGNOSIS — I5032 Chronic diastolic (congestive) heart failure: Secondary | ICD-10-CM | POA: Diagnosis present

## 2019-04-28 DIAGNOSIS — Z794 Long term (current) use of insulin: Secondary | ICD-10-CM | POA: Diagnosis not present

## 2019-04-28 DIAGNOSIS — F17210 Nicotine dependence, cigarettes, uncomplicated: Secondary | ICD-10-CM | POA: Diagnosis present

## 2019-04-28 DIAGNOSIS — Z20828 Contact with and (suspected) exposure to other viral communicable diseases: Secondary | ICD-10-CM | POA: Diagnosis present

## 2019-04-28 DIAGNOSIS — Z8673 Personal history of transient ischemic attack (TIA), and cerebral infarction without residual deficits: Secondary | ICD-10-CM

## 2019-04-28 DIAGNOSIS — J9601 Acute respiratory failure with hypoxia: Secondary | ICD-10-CM | POA: Diagnosis present

## 2019-04-28 DIAGNOSIS — K219 Gastro-esophageal reflux disease without esophagitis: Secondary | ICD-10-CM | POA: Diagnosis present

## 2019-04-28 DIAGNOSIS — Z7982 Long term (current) use of aspirin: Secondary | ICD-10-CM | POA: Diagnosis not present

## 2019-04-28 DIAGNOSIS — Z21 Asymptomatic human immunodeficiency virus [HIV] infection status: Secondary | ICD-10-CM | POA: Diagnosis present

## 2019-04-28 DIAGNOSIS — R0902 Hypoxemia: Secondary | ICD-10-CM | POA: Diagnosis present

## 2019-04-28 DIAGNOSIS — N185 Chronic kidney disease, stage 5: Secondary | ICD-10-CM

## 2019-04-28 DIAGNOSIS — Z79899 Other long term (current) drug therapy: Secondary | ICD-10-CM

## 2019-04-28 DIAGNOSIS — D649 Anemia, unspecified: Secondary | ICD-10-CM | POA: Diagnosis present

## 2019-04-28 DIAGNOSIS — E8889 Other specified metabolic disorders: Secondary | ICD-10-CM | POA: Diagnosis present

## 2019-04-28 DIAGNOSIS — N186 End stage renal disease: Secondary | ICD-10-CM | POA: Diagnosis present

## 2019-04-28 DIAGNOSIS — I132 Hypertensive heart and chronic kidney disease with heart failure and with stage 5 chronic kidney disease, or end stage renal disease: Secondary | ICD-10-CM | POA: Diagnosis present

## 2019-04-28 DIAGNOSIS — E1122 Type 2 diabetes mellitus with diabetic chronic kidney disease: Secondary | ICD-10-CM | POA: Diagnosis present

## 2019-04-28 DIAGNOSIS — E785 Hyperlipidemia, unspecified: Secondary | ICD-10-CM | POA: Diagnosis present

## 2019-04-28 DIAGNOSIS — M810 Age-related osteoporosis without current pathological fracture: Secondary | ICD-10-CM | POA: Diagnosis present

## 2019-04-28 DIAGNOSIS — F329 Major depressive disorder, single episode, unspecified: Secondary | ICD-10-CM | POA: Diagnosis present

## 2019-04-28 DIAGNOSIS — N2581 Secondary hyperparathyroidism of renal origin: Secondary | ICD-10-CM | POA: Diagnosis present

## 2019-04-28 DIAGNOSIS — Z539 Procedure and treatment not carried out, unspecified reason: Secondary | ICD-10-CM | POA: Insufficient documentation

## 2019-04-28 DIAGNOSIS — E8770 Fluid overload, unspecified: Principal | ICD-10-CM | POA: Diagnosis present

## 2019-04-28 DIAGNOSIS — R4182 Altered mental status, unspecified: Secondary | ICD-10-CM | POA: Diagnosis not present

## 2019-04-28 DIAGNOSIS — Z823 Family history of stroke: Secondary | ICD-10-CM

## 2019-04-28 DIAGNOSIS — Z833 Family history of diabetes mellitus: Secondary | ICD-10-CM

## 2019-04-28 LAB — PROTIME-INR
INR: 1.4 — ABNORMAL HIGH (ref 0.8–1.2)
Prothrombin Time: 16.5 seconds — ABNORMAL HIGH (ref 11.4–15.2)

## 2019-04-28 LAB — CBC WITH DIFFERENTIAL/PLATELET
Abs Immature Granulocytes: 0.06 10*3/uL (ref 0.00–0.07)
Basophils Absolute: 0.1 10*3/uL (ref 0.0–0.1)
Basophils Relative: 1 %
Eosinophils Absolute: 0.1 10*3/uL (ref 0.0–0.5)
Eosinophils Relative: 1 %
HCT: 29 % — ABNORMAL LOW (ref 36.0–46.0)
Hemoglobin: 9 g/dL — ABNORMAL LOW (ref 12.0–15.0)
Immature Granulocytes: 1 %
Lymphocytes Relative: 14 %
Lymphs Abs: 1.4 10*3/uL (ref 0.7–4.0)
MCH: 27.8 pg (ref 26.0–34.0)
MCHC: 31 g/dL (ref 30.0–36.0)
MCV: 89.5 fL (ref 80.0–100.0)
Monocytes Absolute: 0.7 10*3/uL (ref 0.1–1.0)
Monocytes Relative: 6 %
Neutro Abs: 8 10*3/uL — ABNORMAL HIGH (ref 1.7–7.7)
Neutrophils Relative %: 77 %
Platelets: 445 10*3/uL — ABNORMAL HIGH (ref 150–400)
RBC: 3.24 MIL/uL — ABNORMAL LOW (ref 3.87–5.11)
RDW: 15.5 % (ref 11.5–15.5)
WBC: 10.3 10*3/uL (ref 4.0–10.5)
nRBC: 0.2 % (ref 0.0–0.2)

## 2019-04-28 LAB — BASIC METABOLIC PANEL
Anion gap: 17 — ABNORMAL HIGH (ref 5–15)
BUN: 69 mg/dL — ABNORMAL HIGH (ref 6–20)
CO2: 21 mmol/L — ABNORMAL LOW (ref 22–32)
Calcium: 8.4 mg/dL — ABNORMAL LOW (ref 8.9–10.3)
Chloride: 98 mmol/L (ref 98–111)
Creatinine, Ser: 5.4 mg/dL — ABNORMAL HIGH (ref 0.44–1.00)
GFR calc Af Amer: 9 mL/min — ABNORMAL LOW (ref >60)
GFR calc non Af Amer: 8 mL/min — ABNORMAL LOW (ref >60)
Glucose, Bld: 123 mg/dL — ABNORMAL HIGH (ref 70–99)
Potassium: 5 mmol/L (ref 3.5–5.1)
Sodium: 136 mmol/L (ref 135–145)

## 2019-04-28 LAB — URINALYSIS, ROUTINE W REFLEX MICROSCOPIC
Bacteria, UA: NONE SEEN
Bilirubin Urine: NEGATIVE
Glucose, UA: NEGATIVE mg/dL
Hgb urine dipstick: NEGATIVE
Ketones, ur: NEGATIVE mg/dL
Leukocytes,Ua: NEGATIVE
Nitrite: NEGATIVE
Protein, ur: 100 mg/dL — AB
Specific Gravity, Urine: 1.01 (ref 1.005–1.030)
pH: 7 (ref 5.0–8.0)

## 2019-04-28 LAB — COMPREHENSIVE METABOLIC PANEL
ALT: 54 U/L — ABNORMAL HIGH (ref 0–44)
AST: 93 U/L — ABNORMAL HIGH (ref 15–41)
Albumin: 3.7 g/dL (ref 3.5–5.0)
Alkaline Phosphatase: 108 U/L (ref 38–126)
Anion gap: 19 — ABNORMAL HIGH (ref 5–15)
BUN: 67 mg/dL — ABNORMAL HIGH (ref 6–20)
CO2: 21 mmol/L — ABNORMAL LOW (ref 22–32)
Calcium: 8.6 mg/dL — ABNORMAL LOW (ref 8.9–10.3)
Chloride: 96 mmol/L — ABNORMAL LOW (ref 98–111)
Creatinine, Ser: 5.63 mg/dL — ABNORMAL HIGH (ref 0.44–1.00)
GFR calc Af Amer: 9 mL/min — ABNORMAL LOW (ref >60)
GFR calc non Af Amer: 8 mL/min — ABNORMAL LOW (ref >60)
Glucose, Bld: 118 mg/dL — ABNORMAL HIGH (ref 70–99)
Potassium: 5 mmol/L (ref 3.5–5.1)
Sodium: 136 mmol/L (ref 135–145)
Total Bilirubin: 0.9 mg/dL (ref 0.3–1.2)
Total Protein: 7 g/dL (ref 6.5–8.1)

## 2019-04-28 LAB — CBC
HCT: 28.2 % — ABNORMAL LOW (ref 36.0–46.0)
Hemoglobin: 8.7 g/dL — ABNORMAL LOW (ref 12.0–15.0)
MCH: 27.6 pg (ref 26.0–34.0)
MCHC: 30.9 g/dL (ref 30.0–36.0)
MCV: 89.5 fL (ref 80.0–100.0)
Platelets: 391 10*3/uL (ref 150–400)
RBC: 3.15 MIL/uL — ABNORMAL LOW (ref 3.87–5.11)
RDW: 15.2 % (ref 11.5–15.5)
WBC: 9 10*3/uL (ref 4.0–10.5)
nRBC: 0.3 % — ABNORMAL HIGH (ref 0.0–0.2)

## 2019-04-28 LAB — AMMONIA: Ammonia: 24 umol/L (ref 9–35)

## 2019-04-28 LAB — CBG MONITORING, ED: Glucose-Capillary: 102 mg/dL — ABNORMAL HIGH (ref 70–99)

## 2019-04-28 LAB — TROPONIN I (HIGH SENSITIVITY)
Troponin I (High Sensitivity): 17 ng/L (ref <18)
Troponin I (High Sensitivity): 17 ng/L (ref <18)

## 2019-04-28 LAB — HEMOGLOBIN A1C
Hgb A1c MFr Bld: 6.5 % — ABNORMAL HIGH (ref 4.8–5.6)
Mean Plasma Glucose: 139.85 mg/dL

## 2019-04-28 LAB — POC OCCULT BLOOD, ED: Fecal Occult Bld: POSITIVE — AB

## 2019-04-28 LAB — GLUCOSE, CAPILLARY
Glucose-Capillary: 104 mg/dL — ABNORMAL HIGH (ref 70–99)
Glucose-Capillary: 89 mg/dL (ref 70–99)

## 2019-04-28 LAB — LACTIC ACID, PLASMA
Lactic Acid, Venous: 1.5 mmol/L (ref 0.5–1.9)
Lactic Acid, Venous: 1.1 mmol/L (ref 0.5–1.9)

## 2019-04-28 LAB — TSH: TSH: 9.747 u[IU]/mL — ABNORMAL HIGH (ref 0.350–4.500)

## 2019-04-28 LAB — D-DIMER, QUANTITATIVE: D-Dimer, Quant: 1.9 ug/mL-FEU — ABNORMAL HIGH (ref 0.00–0.50)

## 2019-04-28 LAB — SARS CORONAVIRUS 2 (TAT 6-24 HRS): SARS Coronavirus 2: NEGATIVE

## 2019-04-28 MED ORDER — ACETAMINOPHEN 325 MG PO TABS: 650.0000 mg | ORAL_TABLET | ORAL | Status: DC | PRN

## 2019-04-28 MED ORDER — SODIUM BICARBONATE 650 MG PO TABS
1300.0000 mg | ORAL_TABLET | Freq: Two times a day (BID) | ORAL | Status: DC
  Administered 2019-04-28 – 2019-05-04 (×12): 1300 mg via ORAL
  Filled 2019-04-28 (×12): qty 2

## 2019-04-28 MED ORDER — DIPHENHYDRAMINE HCL 25 MG PO CAPS
25.0000 mg | ORAL_CAPSULE | ORAL | Status: DC | PRN
  Filled 2019-04-28: qty 1

## 2019-04-28 MED ORDER — ALTEPLASE 2 MG IJ SOLR
2.0000 mg | Freq: Once | INTRAMUSCULAR | Status: DC | PRN
  Filled 2019-04-28: qty 2

## 2019-04-28 MED ORDER — CEFAZOLIN SODIUM-DEXTROSE 2-4 GM/100ML-% IV SOLN: 2.0000 g | INTRAVENOUS | Status: DC

## 2019-04-28 MED ORDER — INSULIN ASPART 100 UNIT/ML ~~LOC~~ SOLN
0.0000 [IU] | Freq: Three times a day (TID) | SUBCUTANEOUS | Status: DC
  Administered 2019-04-30 (×2): 2 [IU] via SUBCUTANEOUS
  Administered 2019-04-30 – 2019-05-01 (×2): 1 [IU] via SUBCUTANEOUS
  Administered 2019-05-01 – 2019-05-02 (×2): 2 [IU] via SUBCUTANEOUS
  Administered 2019-05-03: 3 [IU] via SUBCUTANEOUS
  Administered 2019-05-03: 1 [IU] via SUBCUTANEOUS
  Administered 2019-05-04: 2 [IU] via SUBCUTANEOUS
  Administered 2019-05-04: 1 [IU] via SUBCUTANEOUS

## 2019-04-28 MED ORDER — ATORVASTATIN CALCIUM 40 MG PO TABS
40.0000 mg | ORAL_TABLET | Freq: Every evening | ORAL | Status: DC
  Administered 2019-04-30 – 2019-05-03 (×4): 40 mg via ORAL
  Filled 2019-04-28 (×5): qty 1

## 2019-04-28 MED ORDER — SENNOSIDES-DOCUSATE SODIUM 8.6-50 MG PO TABS: 1.0000 | ORAL_TABLET | Freq: Every evening | ORAL | Status: DC | PRN

## 2019-04-28 MED ORDER — SODIUM CHLORIDE 0.9 % IV SOLN
510.0000 mg | INTRAVENOUS | Status: DC
  Filled 2019-04-28: qty 17

## 2019-04-28 MED ORDER — GABAPENTIN 100 MG PO CAPS
100.0000 mg | ORAL_CAPSULE | Freq: Two times a day (BID) | ORAL | Status: DC
  Administered 2019-04-28 – 2019-05-04 (×12): 100 mg via ORAL
  Filled 2019-04-28 (×12): qty 1

## 2019-04-28 MED ORDER — ENOXAPARIN SODIUM 30 MG/0.3ML ~~LOC~~ SOLN: 30.0000 mg | SUBCUTANEOUS | Status: DC

## 2019-04-28 MED ORDER — METOPROLOL TARTRATE 50 MG PO TABS
100.0000 mg | ORAL_TABLET | Freq: Two times a day (BID) | ORAL | Status: DC
  Administered 2019-04-28 – 2019-05-04 (×10): 100 mg via ORAL
  Filled 2019-04-28 (×12): qty 2

## 2019-04-28 MED ORDER — ONDANSETRON HCL 4 MG PO TABS: 4.0000 mg | ORAL_TABLET | Freq: Four times a day (QID) | ORAL | Status: DC | PRN

## 2019-04-28 MED ORDER — SODIUM CHLORIDE 0.9 % IV BOLUS
1000.0000 mL | Freq: Once | INTRAVENOUS | Status: AC
  Administered 2019-04-28: 1000 mL via INTRAVENOUS

## 2019-04-28 MED ORDER — SODIUM CHLORIDE 0.9 % IV SOLN: 250.0000 mL | INTRAVENOUS | Status: DC | PRN

## 2019-04-28 MED ORDER — PANTOPRAZOLE SODIUM 40 MG PO TBEC
40.0000 mg | DELAYED_RELEASE_TABLET | Freq: Every day | ORAL | Status: DC
  Administered 2019-04-29 – 2019-05-04 (×6): 40 mg via ORAL
  Filled 2019-04-28 (×6): qty 1

## 2019-04-28 MED ORDER — INSULIN ASPART 100 UNIT/ML ~~LOC~~ SOLN
0.0000 [IU] | Freq: Every day | SUBCUTANEOUS | Status: DC
  Administered 2019-05-02: 2 [IU] via SUBCUTANEOUS

## 2019-04-28 MED ORDER — AMLODIPINE BESYLATE 10 MG PO TABS
10.0000 mg | ORAL_TABLET | Freq: Every day | ORAL | Status: DC
  Administered 2019-04-29 – 2019-05-04 (×6): 10 mg via ORAL
  Filled 2019-04-28 (×6): qty 1

## 2019-04-28 MED ORDER — HEPARIN SODIUM (PORCINE) 5000 UNIT/ML IJ SOLN
5000.0000 [IU] | Freq: Two times a day (BID) | INTRAMUSCULAR | Status: DC
  Administered 2019-04-28: 5000 [IU] via SUBCUTANEOUS
  Filled 2019-04-28: qty 1

## 2019-04-28 MED ORDER — POLYETHYLENE GLYCOL 3350 17 G PO PACK
17.0000 g | PACK | Freq: Every day | ORAL | Status: DC
  Administered 2019-05-02 – 2019-05-03 (×2): 17 g via ORAL
  Filled 2019-04-28 (×5): qty 1

## 2019-04-28 MED ORDER — FAMOTIDINE 20 MG PO TABS: 20.0000 mg | ORAL_TABLET | Freq: Two times a day (BID) | ORAL | Status: DC

## 2019-04-28 MED ORDER — ASPIRIN EC 81 MG PO TBEC
81.0000 mg | DELAYED_RELEASE_TABLET | Freq: Every day | ORAL | Status: DC
  Administered 2019-04-30 – 2019-05-04 (×5): 81 mg via ORAL
  Filled 2019-04-28 (×6): qty 1

## 2019-04-28 MED ORDER — EPOETIN ALFA-EPBX 10000 UNIT/ML IJ SOLN: 20000.0000 [IU] | Freq: Once | INTRAMUSCULAR | Status: DC

## 2019-04-28 MED ORDER — SODIUM CHLORIDE 0.9% FLUSH
3.0000 mL | Freq: Two times a day (BID) | INTRAVENOUS | Status: DC
  Administered 2019-04-28 – 2019-05-03 (×9): 3 mL via INTRAVENOUS

## 2019-04-28 MED ORDER — CHLORHEXIDINE GLUCONATE CLOTH 2 % EX PADS
6.0000 | MEDICATED_PAD | Freq: Every day | CUTANEOUS | Status: DC
  Administered 2019-04-28: 6 via TOPICAL

## 2019-04-28 MED ORDER — SODIUM CHLORIDE 0.9 % IV SOLN: 100.0000 mL | INTRAVENOUS | Status: DC | PRN

## 2019-04-28 MED ORDER — FLUOXETINE HCL 10 MG PO CAPS
10.0000 mg | ORAL_CAPSULE | Freq: Every day | ORAL | Status: DC
  Administered 2019-04-29 – 2019-05-04 (×6): 10 mg via ORAL
  Filled 2019-04-28 (×8): qty 1

## 2019-04-28 MED ORDER — SODIUM CHLORIDE 0.9 % IV SOLN: INTRAVENOUS | Status: DC

## 2019-04-28 MED ORDER — SODIUM CHLORIDE 0.9% FLUSH: 3.0000 mL | INTRAVENOUS | Status: DC | PRN

## 2019-04-28 MED ORDER — FAMOTIDINE 20 MG PO TABS
20.0000 mg | ORAL_TABLET | Freq: Every day | ORAL | Status: DC
  Administered 2019-04-28 – 2019-05-04 (×7): 20 mg via ORAL
  Filled 2019-04-28 (×7): qty 1

## 2019-04-28 MED ORDER — ONDANSETRON HCL 4 MG/2ML IJ SOLN: 4.0000 mg | Freq: Four times a day (QID) | INTRAMUSCULAR | Status: DC | PRN

## 2019-04-28 MED ORDER — OXYCODONE HCL 5 MG PO TABS: 5.0000 mg | ORAL_TABLET | ORAL | Status: DC | PRN

## 2019-04-28 MED ORDER — TECHNETIUM TO 99M ALBUMIN AGGREGATED
1.4500 | Freq: Once | INTRAVENOUS | Status: AC | PRN
  Administered 2019-04-28: 1.45 via INTRAVENOUS

## 2019-04-28 MED ORDER — HEPARIN SODIUM (PORCINE) 1000 UNIT/ML DIALYSIS
1000.0000 [IU] | INTRAMUSCULAR | Status: DC | PRN
  Filled 2019-04-28: qty 1

## 2019-04-28 NOTE — ED Notes (Signed)
Patient transported to CT 

## 2019-04-28 NOTE — ED Triage Notes (Signed)
Pt to ED from upstairs, reports feeling bad since Monday, c/o SHOB and cough since Monday. Reports not home 02 user, currently 3L 94%

## 2019-04-28 NOTE — Consult Note (Signed)
[facility] Nephrology Consultation Note  Requesting MD: Dr. Wyvonnia Dusky Reason for consult: ESRD  HPI:  [name] Michael is a 58 y.o. female. W/ a PMH of CKD-5, DM2, HTN, CVA, who presents with weakness, lethargy, HA, cough. She was scheduled to have a tunneled HD cath placed for HD and was sent to the ED for urgent HD by the radiologist. Patient states she has had nasal congestion, watery eyes, SOB, n/v/d since this past Monday. She currently lives in a group home and was scheduled to start HD on Saturday.    Creat  Date/Time Value Ref Range Status  12/20/2012 09:06 AM 0.56 0.50 - 1.10 mg/dL Final   Creatinine, Ser  Date/Time Value Ref Range Status  04/28/2019 09:42 AM 5.63 (H) 0.44 - 1.00 mg/dL Final  04/28/2019 07:45 AM 5.40 (H) 0.44 - 1.00 mg/dL Final  04/11/2019 08:46 AM 5.60 (H) 0.44 - 1.00 mg/dL Final  10/12/2018 02:35 PM 4.50 (H) 0.44 - 1.00 mg/dL Final  06/15/2018 04:11 AM 4.12 (H) 0.44 - 1.00 mg/dL Final  06/14/2018 04:12 AM 4.63 (H) 0.44 - 1.00 mg/dL Final  06/13/2018 08:26 AM 4.72 (H) 0.44 - 1.00 mg/dL Final  06/11/2018 07:55 AM 4.24 (H) 0.44 - 1.00 mg/dL Final  06/10/2018 06:52 AM 4.43 (H) 0.44 - 1.00 mg/dL Final  06/09/2018 06:37 PM 5.04 (H) 0.44 - 1.00 mg/dL Final  06/09/2018 10:36 AM 5.41 (H) 0.44 - 1.00 mg/dL Final  03/13/2018 05:39 AM 4.13 (H) 0.44 - 1.00 mg/dL Final  03/12/2018 02:19 PM 4.04 (H) 0.44 - 1.00 mg/dL Final  03/11/2018 05:04 AM 4.17 (H) 0.44 - 1.00 mg/dL Final  03/10/2018 08:27 PM 4.28 (H) 0.44 - 1.00 mg/dL Final  11/11/2017 08:34 AM 4.53 (H) 0.44 - 1.00 mg/dL Final  03/19/2017 11:35 PM 4.39 (H) 0.44 - 1.00 mg/dL Final  11/21/2016 05:39 AM 4.08 (H) 0.44 - 1.00 mg/dL Final  11/20/2016 03:49 AM 4.29 (H) 0.44 - 1.00 mg/dL Final  11/19/2016 07:42 AM 4.43 (H) 0.44 - 1.00 mg/dL Final  11/19/2016 12:29 AM 4.52 (H) 0.44 - 1.00 mg/dL Final  11/18/2016 05:04 PM 4.70 (H) 0.44 - 1.00 mg/dL Final  11/18/2016 08:33 AM 4.79 (H) 0.44 - 1.00 mg/dL Final   11/18/2016 01:21 AM 5.09 (H) 0.44 - 1.00 mg/dL Final  11/18/2016 01:21 AM 5.09 (H) 0.44 - 1.00 mg/dL Final  11/17/2016 08:00 PM 5.30 (H) 0.44 - 1.00 mg/dL Final  11/17/2016 04:11 PM 5.44 (H) 0.44 - 1.00 mg/dL Final  11/17/2016 05:35 AM 5.63 (H) 0.44 - 1.00 mg/dL Final  11/16/2016 02:25 PM 5.46 (H) 0.44 - 1.00 mg/dL Final  11/16/2016 12:02 PM 5.44 (H) 0.44 - 1.00 mg/dL Final  11/16/2016 08:02 AM 5.54 (H) 0.44 - 1.00 mg/dL Final  11/15/2016 03:42 AM 5.79 (H) 0.44 - 1.00 mg/dL Final  11/14/2016 08:20 AM 6.45 (H) 0.44 - 1.00 mg/dL Final  11/13/2016 05:05 AM 6.31 (H) 0.44 - 1.00 mg/dL Final  11/12/2016 09:03 AM 6.33 (H) 0.44 - 1.00 mg/dL Final  11/11/2016 02:38 AM 7.09 (H) 0.44 - 1.00 mg/dL Final  11/10/2016 11:18 AM 7.39 (H) 0.44 - 1.00 mg/dL Final  11/10/2016 05:14 AM 7.51 (H) 0.44 - 1.00 mg/dL Final  11/09/2016 11:55 PM 7.80 (H) 0.44 - 1.00 mg/dL Final  11/09/2016 06:17 PM 8.00 (H) 0.44 - 1.00 mg/dL Final  11/09/2016 11:22 AM 7.69 (H) 0.44 - 1.00 mg/dL Final  11/09/2016 07:19 AM 7.71 (H) 0.44 - 1.00 mg/dL Final  11/09/2016 01:32 AM 8.41 (H) 0.44 - 1.00 mg/dL Final  11/08/2016 11:43 PM 9.50 (H) 0.44 - 1.00 mg/dL Final  11/08/2016 11:21 PM 6.20 (H) 0.44 - 1.00 mg/dL Final  10/24/2016 04:36 AM 2.54 (H) 0.44 - 1.00 mg/dL Final  10/23/2016 04:26 AM 3.24 (H) 0.44 - 1.00 mg/dL Final  10/22/2016 05:38 PM 3.49 (H) 0.44 - 1.00 mg/dL Final  10/14/2016 01:54 PM 2.29 (H) 0.44 - 1.00 mg/dL Final  06/30/2016 05:00 AM 2.62 (H) 0.44 - 1.00 mg/dL Final  06/29/2016 05:39 AM 2.37 (H) 0.44 - 1.00 mg/dL Final  06/28/2016 03:58 AM 2.24 (H) 0.44 - 1.00 mg/dL Final     PMHx:   Past Medical History:  Diagnosis Date  . Ambulates with cane   . CKD (chronic kidney disease)   . Constipation   . Diabetes mellitus    type 2  . GERD (gastroesophageal reflux disease)   . Hyperlipidemia   . Hypertension   . Osteoporosis   . Stroke [facility]) 04-01-11   left frontal subcortical, saw Dr. [name]   . TIA (transient  ischemic attack) 03-12-11  . Vitamin D deficiency     Past Surgical History:  Procedure Laterality Date  . AV FISTULA PLACEMENT Left 02/21/2019   Procedure: BRACHIOCEPHALIC ARTERIOVENOUS (AV) FISTULA CREATION;  Surgeon: [name], MD;  Location: [place];  Service: Vascular;  Laterality: Left;  . BASCILIC VEIN TRANSPOSITION Left 04/11/2019   Procedure: SECOND STAGE BASILIC VEIN TRANSPOSITION LEFT ARM;  Surgeon: [name], MD;  Location: [place];  Service: Vascular;  Laterality: Left;  . OPEN REDUCTION INTERNAL FIXATION (ORIF) DISTAL RADIAL FRACTURE Left 01/28/2016   Procedure: OPEN REDUCTION INTERNAL FIXATION (ORIF) DISTAL RADIAL FRACTURE;  Surgeon: Iran Planas, MD;  Location: [place];  Service: Orthopedics;  Laterality: Left;  . [place]     age 73    Family Hx:  Family History  Problem Relation Age of Onset  . Stroke Mother   . Diabetes Mother   . Kidney failure Mother   . Heart failure Mother   . Stroke Father   . Cancer Sister        Breast- 28's    Social History:  reports that she has been smoking cigarettes. She has a 32.00 pack-year smoking history. She has never used smokeless tobacco. She reports that she does not drink alcohol or use drugs.  Allergies:  Allergies  Allergen Reactions  . Hydrocodone Nausea And Vomiting    Medications: Prior to Admission medications   Medication Sig Start Date End Date Taking? Authorizing Provider  acetaminophen (TYLENOL) 325 MG tablet Take 650 mg by mouth every 4 (four) hours as needed for mild pain.    [provider]  amLODipine (NORVASC) 10 MG tablet Take 10 mg by mouth daily.    [provider]  aspirin 81 MG tablet Take 1 tablet (81 mg total) by mouth daily. 08/24/12   Reyne Dumas, MD  atorvastatin (LIPITOR) 40 MG tablet Take 40 mg by mouth every evening.     [provider]  diphenhydrAMINE (BENADRYL) 25 MG tablet  Take 25 mg by mouth every 4 (four) hours as needed for itching or allergies.     [provider]  famotidine (PEPCID) 40 MG tablet Take 20 mg by mouth 2 (two) times daily.    [provider]  FLUoxetine (PROZAC) 10 MG capsule Take 10 mg by mouth daily.    [provider]  gabapentin (NEURONTIN) 100 MG capsule Take  100 mg by mouth 2 (two) times daily.    [provider]  insulin lispro (HUMALOG) 100 UNIT/ML injection Inject 8 Units into the skin 3 (three) times daily before meals. Hold if BS <150 or if not eating.    [provider]  metoprolol (LOPRESSOR) 100 MG tablet Take 100 mg by mouth 2 (two) times daily.    [provider]  Multiple Vitamins-Minerals (CERTAVITE SENIOR/ANTIOXIDANT) TABS Take 1 tablet by mouth daily.    [provider]  omeprazole (PRILOSEC) 40 MG capsule Take 40 mg by mouth daily.    [provider]  oxyCODONE (ROXICODONE) 5 MG immediate release tablet Take 1 tablet (5 mg total) by mouth every 8 (eight) hours as needed. Patient not taking: Reported on 04/21/2019 04/11/19   Gabriel Earing, PA-C  polyethylene glycol (MIRALAX / GLYCOLAX) packet Take 17 g by mouth daily. 11/22/16   Ghimire, Henreitta Leber, MD  SACCHAROMYCES BOULARDII PO Take 1 capsule by mouth 2 (two) times daily.    [provider]  senna-docusate (SENOKOT-S) 8.6-50 MG tablet Take 1 tablet by mouth at bedtime as needed for mild constipation. 06/11/18   Regalado, [name], MD  sodium bicarbonate 650 MG tablet Take 1,300 mg by mouth 2 (two) times daily.    [provider]  Vitamin D, Ergocalciferol, (DRISDOL) 50000 units CAPS capsule Take 1 capsule (50,000 Units total) by mouth every 7 (seven) days. 11/21/16   Ghimire, Henreitta Leber, MD    I have reviewed the patient's current medications.  Labs:  Results for orders placed or performed during the hospital encounter of 04/28/19 (from the past 48 hour(s))  POC occult blood, ED      Status: Abnormal   Collection Time: 04/28/19  9:24 AM  Result Value Ref Range   Fecal Occult Bld POSITIVE (A) NEGATIVE  CBG monitoring, ED     Status: Abnormal   Collection Time: 04/28/19  9:26 AM  Result Value Ref Range   Glucose-Capillary 102 (H) 70 - 99 mg/dL  CBC with Differential     Status: Abnormal   Collection Time: 04/28/19  9:42 AM  Result Value Ref Range   WBC 10.3 4.0 - 10.5 K/uL   RBC 3.24 (L) 3.87 - 5.11 MIL/uL   Hemoglobin 9.0 (L) 12.0 - 15.0 g/dL   HCT 29.0 (L) 36.0 - 46.0 %   MCV 89.5 80.0 - 100.0 fL   MCH 27.8 26.0 - 34.0 pg   MCHC 31.0 30.0 - 36.0 g/dL   RDW 15.5 11.5 - 15.5 %   Platelets 445 (H) 150 - 400 K/uL   nRBC 0.2 0.0 - 0.2 %   Neutrophils Relative % 77 %   Neutro Abs 8.0 (H) 1.7 - 7.7 K/uL   Lymphocytes Relative 14 %   Lymphs Abs 1.4 0.7 - 4.0 K/uL   Monocytes Relative 6 %   Monocytes Absolute 0.7 0.1 - 1.0 K/uL   Eosinophils Relative 1 %   Eosinophils Absolute 0.1 0.0 - 0.5 K/uL   Basophils Relative 1 %   Basophils Absolute 0.1 0.0 - 0.1 K/uL   Immature Granulocytes 1 %   Abs Immature Granulocytes 0.06 0.00 - 0.07 K/uL    Comment: Performed at [facility], [address], [place], [place] [address]  Comprehensive metabolic panel     Status: Abnormal   Collection Time: 04/28/19  9:42 AM  Result Value Ref Range   Sodium 136 135 - 145 mmol/L   Potassium 5.0 3.5 -  5.1 mmol/L   Chloride 96 (L) 98 - 111 mmol/L   CO2 21 (L) 22 - 32 mmol/L   Glucose, Bld 118 (H) 70 - 99 mg/dL   BUN 67 (H) 6 - 20 mg/dL   Creatinine, Ser 5.63 (H) 0.44 - 1.00 mg/dL   Calcium 8.6 (L) 8.9 - 10.3 mg/dL   Total Protein 7.0 6.5 - 8.1 g/dL   Albumin 3.7 3.5 - 5.0 g/dL   AST 93 (H) 15 - 41 U/L   ALT 54 (H) 0 - 44 U/L   Alkaline Phosphatase 108 38 - 126 U/L   Total Bilirubin 0.9 0.3 - 1.2 mg/dL   GFR calc non Af Amer 8 (L) >60 mL/min   GFR calc Af Amer 9 (L) >60 mL/min   Anion gap 19 (H) 5 - 15    Comment: Performed at [facility], [place] [address]  [place], [place] [address]  Lactic acid, plasma     Status: None   Collection Time: 04/28/19  9:42 AM  Result Value Ref Range   Lactic Acid, Venous 1.5 0.5 - 1.9 mmol/L    Comment: Performed at [facility] [address], [place], [place] [address]  D-dimer, quantitative (not at [facility])     Status: Abnormal   Collection Time: 04/28/19  9:42 AM  Result Value Ref Range   D-Dimer, Quant 1.90 (H) 0.00 - 0.50 ug/mL-FEU    Comment: (NOTE) At the manufacturer cut-off of 0.50 ug/mL FEU, this assay has been documented to exclude PE with a sensitivity and negative predictive value of 97 to 99%.  At this time, this assay has not been approved by the FDA to exclude DVT/VTE. Results should be correlated with clinical presentation. Performed at [facility], [place] [address] [place], [place] [address]   Troponin I (High Sensitivity)     Status: None   Collection Time: 04/28/19  9:42 AM  Result Value Ref Range   Troponin I (High Sensitivity) 17 <18 ng/L    Comment: (NOTE) Elevated high sensitivity troponin I (hsTnI) values and significant  changes across serial measurements may suggest ACS but many other  chronic and acute conditions are known to elevate hsTnI results.  Refer to the "Links" section for chest pain algorithms and additional  guidance. Performed at [facility], [place] [address] [place], [place] [address]   Ammonia     Status: None   Collection Time: 04/28/19  9:42 AM  Result Value Ref Range   Ammonia 24 9 - 35 umol/L    Comment: Performed at [facility], [place] [address], [place], [place] [address]  TSH     Status: Abnormal   Collection Time: 04/28/19  9:42 AM  Result Value Ref Range   TSH 9.747 (H) 0.350 - 4.500 uIU/mL    Comment: Performed by a 3rd Generation assay with a functional sensitivity of <=0.01 uIU/mL. Performed at [facility], [place] [address] [place], [place] [address]   Troponin I (High Sensitivity)     Status: None   Collection Time:  04/28/19 11:27 AM  Result Value Ref Range   Troponin I (High Sensitivity) 17 <18 ng/L    Comment: (NOTE) Elevated high sensitivity troponin I (hsTnI) values and significant  changes across serial measurements may suggest ACS but many other  chronic and acute conditions are known to elevate hsTnI results.  Refer to the "Links" section for chest pain algorithms and additional  guidance.  Performed at [facility], [place] [address], [place], [place] [address]   Lactic acid, plasma     Status: None   Collection Time: 04/28/19 12:00 PM  Result Value Ref Range   Lactic Acid, Venous 1.1 0.5 - 1.9 mmol/L    Comment: Performed at [facility] [address] [place], [place] [address]     ROS:  Pertinent items are noted in HPI.  Physical Exam: Vitals:   04/28/19 1358 04/28/19 1415  BP:  (!) 116/100  Pulse: 63 63  Resp:    Temp:    SpO2: 99% 100%     General exam: laying on side, in moderate respiratory distress.   HEENT: periorbital edema.       Respiratory system: decreased air movement.  Inspiratory crackles heard on left side. Pt satting 88-90% on 6L room air.  Cardiovascular system: RRR. No murmurs.  Gastrointestinal system: Abdomen is nondistended, soft and nontender. Normal bowel sounds heard. Central nervous system: Alert and oriented.  Extremities: 1+ pedal edema bilaterally.  Skin: No rashes, lesions or ulcers Psychiatry: Judgement and insight appear normal. Mood & affect appropriate.   Assessment/Plan: 1.Renal- CKD-5 - Cr 5.63, similar to value from 10/19. BUN 67, K 5.0, Ca 8.6. diffuse subQ edema seen on abdominal CT. Pt was scheduled to have tunneled HD cath placed today for initiation of HD this Saturday. Pt has had worsening uremic symptoms including weakness, SOB, n/v since Monday. Pt will be admitted for acute hypoxic respiratory failure and will likely be hospitalized for several days.  Will place HD cath and start HD during this hospital admission.     2. Acute hypoxic respiratory failure - managed by primary team.  Has URI symptoms and requiring 6L o2 (no o2 requirement at baseline).  D-dimer was elevated, v/q scan ordered. Pleural effusion seen on xray.  3. Hypertension/volume  - pt has some pedal edema and pleural effusion indicating hypervolemia.  BP has been elevated since admission.   4. Anemia  - 9.0.  Stable.    Benay Pike 04/28/2019, 3:07 PM

## 2019-04-28 NOTE — Progress Notes (Signed)
Patient has OP HD treatment arranged at [facility] clinic on a TTS schedule with a seat time of 12:30pm. She needs to arrive to her appointments at 12:10pm.  Renal Navigator will continue to follow.  Alphonzo Cruise, [place] Renal Navigator [phone]

## 2019-04-28 NOTE — ED Notes (Signed)
Supplemental O2 via n.c titrated down to 4L/min from 5L/min. O2 sat remains 94-96%. Pt denies shortness of breath at this time

## 2019-04-28 NOTE — Progress Notes (Signed)
Patient referred to ED from short stay this AM due to lethargy.  Work-up in the ED today has been unrevealing of any infectious concerns or PE.    Order now placed for dialysis catheter.  Patient to be admitted overnight.  Discussed with Dr. Laren Everts who states plan is for dialysis today or tomorrow.   Patient originally planning for tunneled dialysis catheter. Will plan for tunneled dialysis catheter tomorrow.  NPO after MN.   Brynda Greathouse, MS RD PA-C

## 2019-04-28 NOTE — Progress Notes (Signed)
Patient ID: [name] Michael, female   DOB: Sep 29, 1960, 58 y.o.   MRN: [id number]   Pt has come to [facility] as OP for tunneled HD catheter placement Had new left AV fistula placed 02/21/19 with second stage 04/11/19 with Dr Wilmer Floor  Need for urgent dialysis per Dr Leanora Cover Was scheduled as OP for tunneled HD placement today as mentioned  Pt presents to [facility] very lethargic and weak Unable to walk un assisted +body aches; SOB; minimal cough Headaches Watery eyes; gaze  Afebrile Has had documented Covid Neg 04/11/19 in hospital  Living in SNF and has been there since 04/11/19  VS: 02sat 81% RA 92% 4L [place]   Discussed with Dr Anselm Pancoast Pt to go to ED for evaluation  Covid testing Nephro consult Could consider TEMP cath today - if necessary  Spoke to Dr Carolin Sicks--- he agrees with ED plan Will await for ED MD to contact him for eval or consultation if neeed

## 2019-04-28 NOTE — ED Provider Notes (Addendum)
[place] EMERGENCY DEPARTMENT Provider Note   CSN: [id number] Arrival date & time: 04/28/19  0900     History   Chief Complaint Chief Complaint  Patient presents with   Shortness of Breath    HPI [name] Michael is a 58 y.o. female.     Level 5 caveat for altered mental status.  Patient sent from short stay where she was supposed to have a vascular procedure in preparation for dialysis.  "Pt has come to [facility] as OP for tunneled HD catheter placement Had new left AV fistula placed 02/21/19 with second stage 04/11/19 with Dr Wilmer Floor" Patient was found to be weak and lethargic with O2 saturations of 80% on room air.  States she has been "sick" for 2 or 3 days with cough, generalized weakness, fatigue.  No fever.  She states she is had nausea and vomiting of black stools for the past 2 or 3 days as well.  Complains of diffuse crampy abdominal pain that is improving.  Denies any headache or body aches.  Denies any known fevers.  Denies any chest pain or shortness of breath but has had a cough.  Her O2 saturation has increased to 94% on 3 L.  She is in no respiratory distress.  She denies any sick contacts.  She appears somewhat slow to respond and does take chronic pain medication at home.  Reportedly had negative Covid testing earlier this month.  The history is provided by the patient. The history is limited by the condition of the patient.  Shortness of Breath Associated symptoms: abdominal pain, cough and vomiting   Associated symptoms: no fever and no headaches     Past Medical History:  Diagnosis Date   Ambulates with cane    CKD (chronic kidney disease)    Constipation    Diabetes mellitus    type 2   GERD (gastroesophageal reflux disease)    Hyperlipidemia    Hypertension    Osteoporosis    Stroke ([place]) 04-01-11   left frontal subcortical, saw Dr. [name]    TIA (transient ischemic attack) 03-12-11   Vitamin D deficiency     Patient Active  Problem List   Diagnosis Date Noted   Fall 06/09/2018   Fall at home, initial encounter 06/09/2018   Anemia of chronic disease 06/09/2018   Syncope and collapse 06/09/2018   CKD (chronic kidney disease) stage 5, GFR less than 15 ml/min (HCC) 03/10/2018   Acute encephalopathy 03/10/2018   Hypermagnesemia 03/10/2018   AKI (acute kidney injury) ([place]) 11/09/2016   Hypercalcemia 11/09/2016   Hyponatremia 11/09/2016   Hypovolemia 11/09/2016   Accelerated hypertension 11/09/2016   Acute lower UTI 11/09/2016   Nephrolithiasis 10/24/2016   Constipation 10/22/2016   CAP (community acquired pneumonia) 10/22/2016   Hydronephrosis of right kidney [id number]   Metabolic acidosis 0000000   CVA (cerebral vascular accident) ([place]) 07/03/2016   HAP (hospital-acquired pneumonia) 07/03/2016   Sepsis secondary to UTI [facility])    UTI due to extended-spectrum beta lactamase (ESBL) producing Escherichia coli    Acute pyelonephritis    Bacteremia due to Escherichia coli    Colitis, indeterminate    Uncontrolled type 2 diabetes mellitus with complication ([place])    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 associated with diabetes mellitus due to underlying condition (HCC)    Chronic diastolic CHF (congestive heart failure) ([place])    Abdominal pain    Colitis    Hypophosphatemia 06/12/2016   Hypokalemia 06/11/2016  Hypocalcemia 06/11/2016   Acute kidney injury superimposed on chronic kidney disease ([place]) 06/11/2016   Proliferative diabetic retinopathy ([place]) 04/29/2016   Closed fracture of left distal radius 01/28/2016   Poor social situation 03/09/2013   Depression 03/01/2013   Abnormal mammogram 12/20/2012   Retinal detachment 11/17/2012   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 ([place]) [id number]   Hyperlipidemia 02/09/2007   Essential hypertension 02/09/2007   GERD 02/09/2007    Past Surgical History:  Procedure  Laterality Date   AV FISTULA PLACEMENT Left 02/21/2019   Procedure: BRACHIOCEPHALIC ARTERIOVENOUS (AV) FISTULA CREATION;  Surgeon: [name], MD;  Location: [place];  Service: Vascular;  Laterality: Left;   [place] Left 04/11/2019   Procedure: SECOND STAGE BASILIC VEIN TRANSPOSITION LEFT ARM;  Surgeon: [name], MD;  Location: [place];  Service: Vascular;  Laterality: Left;   OPEN REDUCTION INTERNAL FIXATION (ORIF) DISTAL RADIAL FRACTURE Left 01/28/2016   Procedure: OPEN REDUCTION INTERNAL FIXATION (ORIF) DISTAL RADIAL FRACTURE;  Surgeon: Iran Planas, MD;  Location: [place];  Service: Orthopedics;  Laterality: Left;   [place]   TONSILLECTOMY AND ADENOIDECTOMY     age 58     OB History   No obstetric history on file.      Home Medications    Prior to Admission medications   Medication Sig Start Date End Date Taking? Authorizing Provider  acetaminophen (TYLENOL) 325 MG tablet Take 650 mg by mouth every 4 (four) hours as needed for mild pain.    [provider]  amLODipine (NORVASC) 10 MG tablet Take 10 mg by mouth daily.    [provider]  aspirin 81 MG tablet Take 1 tablet (81 mg total) by mouth daily. 08/24/12   Reyne Dumas, MD  atorvastatin (LIPITOR) 40 MG tablet Take 40 mg by mouth every evening.     [provider]  diphenhydrAMINE (BENADRYL) 25 MG tablet Take 25 mg by mouth every 4 (four) hours as needed for itching or allergies.     [provider]  famotidine (PEPCID) 40 MG tablet Take 20 mg by mouth 2 (two) times daily.    [provider]  FLUoxetine (PROZAC) 10 MG capsule Take 10 mg by mouth daily.    [provider]  gabapentin (NEURONTIN) 100 MG capsule Take 100 mg by mouth 2 (two) times daily.    [provider]  insulin lispro (HUMALOG) 100 UNIT/ML injection Inject 8 Units into the skin 3 (three) times daily before meals. Hold if BS <150  or if not eating.    [provider]  metoprolol (LOPRESSOR) 100 MG tablet Take 100 mg by mouth 2 (two) times daily.    [provider]  Multiple Vitamins-Minerals (CERTAVITE SENIOR/ANTIOXIDANT) TABS Take 1 tablet by mouth daily.    [provider]  omeprazole (PRILOSEC) 40 MG capsule Take 40 mg by mouth daily.    [provider]  oxyCODONE (ROXICODONE) 5 MG immediate release tablet Take 1 tablet (5 mg total) by mouth every 8 (eight) hours as needed. Patient not taking: Reported on 04/21/2019 04/11/19   Gabriel Earing, PA-C  polyethylene glycol (MIRALAX / GLYCOLAX) packet Take 17 g by mouth daily. 11/22/16   Ghimire, Henreitta Leber, MD  SACCHAROMYCES BOULARDII PO Take 1 capsule by mouth 2 (two) times daily.    [provider]  senna-docusate (SENOKOT-S) 8.6-50 MG tablet Take 1 tablet by mouth at bedtime as  needed for mild constipation. 06/11/18   Regalado, [name], MD  sodium bicarbonate 650 MG tablet Take 1,300 mg by mouth 2 (two) times daily.    [provider]  Vitamin D, Ergocalciferol, (DRISDOL) 50000 units CAPS capsule Take 1 capsule (50,000 Units total) by mouth every 7 (seven) days. 11/21/16   Ghimire, Henreitta Leber, MD    Family History Family History  Problem Relation Age of Onset   Stroke Mother    Diabetes Mother    Kidney failure Mother    Heart failure Mother    Stroke Father    Cancer Sister        Breast- 29's    Social History Social History   Tobacco Use   Smoking status: Current Every Day Smoker    Packs/day: 1.00    Years: 32.00    Pack years: 32.00    Types: Cigarettes    Last attempt to quit: 09/23/2012    Years since quitting: 6.5   Smokeless tobacco: Never Used   Tobacco comment: or less  Substance Use Topics   Alcohol use: No   Drug use: No     Allergies   Hydrocodone   Review of Systems Review of Systems  Constitutional: Positive for activity change, appetite change and fatigue. Negative  for fever.  HENT: Negative for congestion and rhinorrhea.   Eyes: Negative for visual disturbance.  Respiratory: Positive for cough and shortness of breath.   Gastrointestinal: Positive for abdominal pain, diarrhea, nausea and vomiting. Negative for constipation.  Genitourinary: Negative for dysuria.  Musculoskeletal: Positive for arthralgias. Negative for myalgias.  Neurological: Positive for weakness. Negative for dizziness and headaches.   all other systems are negative except as noted in the HPI and PMH.     Physical Exam Updated Vital Signs BP (!) 139/97 (BP Location: Right Arm)    Pulse 63    Temp 98 F (36.7 C) (Rectal)    Resp 15    Ht 4\' 6"  (1.372 m) Comment: per history   Wt 46.7 kg    LMP 03/06/2013 (LMP Unknown)    SpO2 94%    BMI 24.83 kg/m   Physical Exam Vitals signs and nursing note reviewed.  Constitutional:      General: She is not in acute distress.    Appearance: She is well-developed. She is ill-appearing.     Comments: Fatigued appearing, slow speech, somewhat slow to respond  HENT:     Head: Normocephalic and atraumatic.     Mouth/Throat:     Pharynx: No oropharyngeal exudate.  Eyes:     Conjunctiva/sclera: Conjunctivae normal.     Pupils: Pupils are equal, round, and reactive to light.  Neck:     Musculoskeletal: Normal range of motion and neck supple.     Comments: No meningismus.  Scattered ecchymosis to anterior neck.  Patient states "I did something with my dialysis there" began Cardiovascular:     Rate and Rhythm: Normal rate and regular rhythm.     Heart sounds: Normal heart sounds. No murmur.  Pulmonary:     Effort: Pulmonary effort is normal. No respiratory distress.     Breath sounds: Normal breath sounds.     Comments: Diminished breath sounds Abdominal:     General: There is distension.     Palpations: Abdomen is soft.     Tenderness: There is abdominal tenderness. There is no guarding or rebound.  Musculoskeletal: Normal range of  motion.        General:  No tenderness.     Comments: Left upper extremity graft with palpable thrill and bruit  Skin:    General: Skin is warm.  Neurological:     General: No focal deficit present.     Mental Status: She is alert and oriented to person, place, and time.     Cranial Nerves: No cranial nerve deficit.     Motor: No abnormal muscle tone.     Coordination: Coordination normal.     Comments: Cranial nerves II to XII intact, 5/5 strength throughout, follows commands albeit slowly without obvious focal deficit.  Psychiatric:        Behavior: Behavior normal.      ED Treatments / Results  Labs (all labs ordered are listed, but only abnormal results are displayed) Labs Reviewed  CBC WITH DIFFERENTIAL/PLATELET - Abnormal; Notable for the following components:      Result Value   RBC 3.24 (*)    Hemoglobin 9.0 (*)    HCT 29.0 (*)    Platelets 445 (*)    Neutro Abs 8.0 (*)    All other components within normal limits  COMPREHENSIVE METABOLIC PANEL - Abnormal; Notable for the following components:   Chloride 96 (*)    CO2 21 (*)    Glucose, Bld 118 (*)    BUN 67 (*)    Creatinine, Ser 5.63 (*)    Calcium 8.6 (*)    AST 93 (*)    ALT 54 (*)    GFR calc non Af Amer 8 (*)    GFR calc Af Amer 9 (*)    Anion gap 19 (*)    All other components within normal limits  D-DIMER, QUANTITATIVE (NOT AT [facility]) - Abnormal; Notable for the following components:   D-Dimer, Quant 1.90 (*)    All other components within normal limits  TSH - Abnormal; Notable for the following components:   TSH 9.747 (*)    All other components within normal limits  POC OCCULT BLOOD, ED - Abnormal; Notable for the following components:   Fecal Occult Bld POSITIVE (*)    All other components within normal limits  CBG MONITORING, ED - Abnormal; Notable for the following components:   Glucose-Capillary 102 (*)    All other components within normal limits  SARS CORONAVIRUS 2 (TAT 6-24 HRS)  CULTURE,  BLOOD (ROUTINE X 2)  CULTURE, BLOOD (ROUTINE X 2)  URINE CULTURE  LACTIC ACID, PLASMA  LACTIC ACID, PLASMA  AMMONIA  URINALYSIS, ROUTINE W REFLEX MICROSCOPIC  POC OCCULT BLOOD, ED  TROPONIN I (HIGH SENSITIVITY)  TROPONIN I (HIGH SENSITIVITY)    EKG EKG Interpretation  Date/Time:  Thursday April 28 2019 09:13:59 EST Ventricular Rate:  63 PR Interval:    QRS Duration: 92 QT Interval:  477 QTC Calculation: 489 R Axis:   111 Text Interpretation: Sinus rhythm Right axis deviation Borderline low voltage, extremity leads Minimal ST depression, diffuse leads Borderline prolonged QT interval minimal ST depression laterally Confirmed by Ezequiel Essex [phone]) on 04/28/2019 9:34:05 AM   Radiology Ct Abdomen Pelvis Wo Contrast  Result Date: 04/28/2019 CLINICAL DATA:  58 year old female with abdominal pain, nausea and vomiting. Chronic kidney disease. EXAM: CT ABDOMEN AND PELVIS WITHOUT CONTRAST TECHNIQUE: Multidetector CT imaging of the abdomen and pelvis was performed following the standard protocol without IV contrast. COMPARISON:  11/09/2016 CT and prior studies FINDINGS: Please note that parenchymal abnormalities may be missed without intravenous contrast. Lower chest: A small to moderate RIGHT pleural effusion with RIGHT  basilar atelectasis identified. Hepatobiliary: No definite hepatic or gallbladder abnormalities noted. No biliary dilatation. Pancreas: No acute abnormalities. Calcifications along the unscented process are unchanged. Spleen: Unremarkable Adrenals/Urinary Tract: No significant changes with 3 punctate nonobstructing LEFT renal calculi and mild perinephric stranding. No evidence of hydronephrosis or obstructing urinary calculi identified. The adrenal glands and bladder are unremarkable. Stomach/Bowel: Stomach is within normal limits. Appendix appears normal. No evidence of bowel wall thickening, distention, or inflammatory changes. Vascular/Lymphatic: Aortic atherosclerosis.  No enlarged abdominal or pelvic lymph nodes. Reproductive: Uterus and bilateral adnexa are unremarkable. Other: Small amount of ascites within the abdomen and pelvis identified. Diffuse subcutaneous edema is present. No focal collection or pneumoperitoneum. Musculoskeletal: No acute or suspicious bony abnormalities. IMPRESSION: 1. Small amount of ascites, diffuse subcutaneous edema and small to moderate RIGHT pleural effusion. 2. Nonobstructing LEFT renal calculi 3. Aortic Atherosclerosis (ICD10-I70.0). Electronically Signed   By: Margarette Canada M.D.   On: 04/28/2019 14:12   Ct Head Wo Contrast  Result Date: 04/28/2019 CLINICAL DATA:  58 year old female with altered mental status. EXAM: CT HEAD WITHOUT CONTRAST TECHNIQUE: Contiguous axial images were obtained from the base of the skull through the vertex without intravenous contrast. COMPARISON:  03/10/2018 FINDINGS: Brain: No evidence of acute infarction, hemorrhage, hydrocephalus, extra-axial collection or mass lesion/mass effect. Chronic small-vessel white matter ischemic changes and remote bilateral basal ganglia infarcts again noted. Vascular: Carotid and vertebral atherosclerotic calcifications again noted. Skull: Normal. Negative for fracture or focal lesion. Sinuses/Orbits: No acute finding. Other: None. IMPRESSION: 1. No evidence of acute intracranial abnormality. 2. Chronic small-vessel white matter ischemic changes and remote bilateral basal ganglia infarcts. Electronically Signed   By: Margarette Canada M.D.   On: 04/28/2019 14:03   Nm Pulmonary Perfusion  Result Date: 04/28/2019 CLINICAL DATA:  Elevated D-dimer.  Concern for pulmonary embolism. EXAM: NUCLEAR MEDICINE PERFUSION LUNG SCAN TECHNIQUE: Perfusion images were obtained in multiple projections after intravenous injection of radiopharmaceutical. RADIOPHARMACEUTICALS:  1.45 mCi Tc-20m MAA COMPARISON:  Chest radiograph FINDINGS: No wedge-shaped peripheral perfusion defects to suggest acute pulmonary  embolism. IMPRESSION: No evidence of acute pulmonary embolism. Electronically Signed   By: Suzy Bouchard M.D.   On: 04/28/2019 16:16   Dg Chest Portable 1 View  Result Date: 04/28/2019 CLINICAL DATA:  Altered mental status and lethargy. Shortness of breath. EXAM: PORTABLE CHEST 1 VIEW COMPARISON:  June 10, 2018 FINDINGS: There is a small right pleural effusion with right base atelectasis. There is also mild atelectasis in the left mid lung. The lungs elsewhere are clear. There is cardiomegaly with pulmonary vascularity normal. No adenopathy. No bone lesions. IMPRESSION: Small right pleural effusion with right base atelectasis. There is left midlung atelectasis. There is no frank edema or consolidation. Heart mildly enlarged, stable. Pulmonary vascularity within normal limits. No evident adenopathy. Electronically Signed   By: Lowella Grip III M.D.   On: 04/28/2019 10:19   Dg Abd Portable 2 Views  Result Date: 04/28/2019 CLINICAL DATA:  Distended abdomen, abdominal pain. EXAM: PORTABLE ABDOMEN - 2 VIEW COMPARISON:  Chest x-ray of 11/08/2016, x-ray also of 06/10/2018 FINDINGS: Heart size is enlarged and there is a right-sided pleural effusion. Chest is incompletely imaged. No free air beneath either right or left hemidiaphragm on today's exam. Limited assessment due to patient body habitus. Bowel gas pattern without signs of obstruction. Paucity of gas noted in the central abdomen. No acute bone finding. IMPRESSION: 1. Right pleural effusion 2. No no signs of free air or bowel obstruction. Electronically Signed  By: Zetta Bills M.D.   On: 04/28/2019 10:23    Procedures .Critical Care Performed by: Ezequiel Essex, MD Authorized by: Ezequiel Essex, MD   Critical care provider statement:    Critical care time (minutes):  45   Critical care time was exclusive of:  Separately billable procedures and treating other patients   Critical care was necessary to treat or prevent imminent or  life-threatening deterioration of the following conditions:  Renal failure and respiratory failure   Critical care was time spent personally by me on the following activities:  Discussions with consultants, evaluation of patient's response to treatment, examination of patient, ordering and performing treatments and interventions, ordering and review of laboratory studies, ordering and review of radiographic studies, pulse oximetry, re-evaluation of patient's condition, obtaining history from patient or surrogate and review of old charts   (including critical care time)  Medications Ordered in ED Medications - No data to display   Initial Impression / Assessment and Plan / ED Course  I have reviewed the triage vital signs and the nursing notes.  Pertinent labs & imaging results that were available during my care of the patient were reviewed by me and considered in my medical decision making (see chart for details).       Patient sent from short stay where she was to have a dialysis catheter placed found to be hypoxic, confused, nausea and vomiting for several days.  She is afebrile on arrival with stable vital signs with somewhat slow to respond and requiring oxygen which is new but in no respiratory distress.  Labs show stable anemia.  Chest x-ray shows small pleural effusion without current consolidation.  Patient in no respiratory distress. D-dimer is positive but she is likely not a candidate for CTA due to her CKD.  BUN/creatinine are stable.  Potassium 5.0.  Lactate is normal.  No fever.  Will hold on antibiotics at this point without clear source of infection.  Workup revealing for infectious cause. CT head negative. Edema and pleural effusion seen on CT scan.   Suspect majority of symptoms due to worsening uremia. She needs dialysis urgently but not emergently. D/w Dr. Royce Macadamia of nephrology who will evaluate.   Admission d/w Dr. Laren Everts. He agrees with holding antibiotics at this  time. VQ scan pending for further evaluation of hypoxia. COVID pending.   [name] Michael was evaluated in Emergency Department on 04/28/2019 for the symptoms described in the history of present illness. She was evaluated in the context of the global COVID-19 pandemic, which necessitated consideration that the patient might be at risk for infection with the SARS-CoV-2 virus that causes COVID-19. Institutional protocols and algorithms that pertain to the evaluation of patients at risk for COVID-19 are in a state of rapid change based on information released by regulatory bodies including the CDC and federal and state organizations. These policies and algorithms were followed during the patient's care in the ED.  Final Clinical Impressions(s) / ED Diagnoses   Final diagnoses:  Altered mental status, unspecified altered mental status type  Hypoxia    ED Discharge Orders    None       Demondre Aguas, Annie Main, MD 04/28/19 Arlee Muslim, MD 04/28/19 1720

## 2019-04-28 NOTE — Progress Notes (Addendum)
Pt Spo2 is 81-82%m on room air, wave form is good on monitor, pt states information correctly but has a far away gaze, [place] 2L then 4L and Spo2 came up to 93% after a few minutes. I called radiology charge nurse/ Patty RN and updated her, she will call Jannifer Franklin PA to inform., BP 114/63 and glucose 104. C/O leg discomfort when walking, fatigue. Jannifer Franklin PA will assess further.  0815 assessing covid sceening, pt had many Sx, Jannifer Franklin PA was called and will speak with doctor on her case, IV team is here to get IV/labs, will hold them till updated. IV team willing to get IV access prior to taking to ED. 0830; spoke with Jannifer Franklin PA, pt will go to ED to be evaluated, I called ED charge/ Delia Chimes and updated her.  I called nursing facility/ [facility], [phone]  and left a message that she would be going to ED,  0900 transferred to ED/ Gabby NT via WC and O2 [place] 4L.

## 2019-04-28 NOTE — H&P (Signed)
[facility]                                                                                    Patient Demographics  [name] Michael, is a 58 y.o. female  CSN: [id number]  MRN: [id number]  DOB - June 24, 1960  Admit Date - 04/28/2019  Outpatient Primary MD for the patient is Charlott Rakes, MD   With History of -  Past Medical History:  Diagnosis Date  . Ambulates with cane   . CKD (chronic kidney disease)   . Constipation   . Diabetes mellitus    type 2  . GERD (gastroesophageal reflux disease)   . Hyperlipidemia   . Hypertension   . Osteoporosis   . Stroke [facility]) 04-01-11   left frontal subcortical, saw Dr. [name]   . TIA (transient ischemic attack) 03-12-11  . Vitamin D deficiency       Past Surgical History:  Procedure Laterality Date  . AV FISTULA PLACEMENT Left 02/21/2019   Procedure: BRACHIOCEPHALIC ARTERIOVENOUS (AV) FISTULA CREATION;  Surgeon: [name], MD;  Location: [place];  Service: Vascular;  Laterality: Left;  . BASCILIC VEIN TRANSPOSITION Left 04/11/2019   Procedure: SECOND STAGE BASILIC VEIN TRANSPOSITION LEFT ARM;  Surgeon: [name], MD;  Location: [place];  Service: Vascular;  Laterality: Left;  . OPEN REDUCTION INTERNAL FIXATION (ORIF) DISTAL RADIAL FRACTURE Left 01/28/2016   Procedure: OPEN REDUCTION INTERNAL FIXATION (ORIF) DISTAL RADIAL FRACTURE;  Surgeon: Iran Planas, MD;  Location: [place];  Service: Orthopedics;  Laterality: Left;  . [place]     age 57    in for   Chief Complaint  Patient presents with  . Shortness of Breath     HPI  [name] Michael  is a 58 y.o. female,  Who has a history of end-stage renal disease in preparation for dialysis who came to outpatient surgery today for scheduled tunneled hemodialysis catheter.  Patient has new left AV fistula placed on 8/31-second stage on 10/19 by Dr. Carlis Abbott . Later on today the patient was noted  to be lethargic with saturation of 80% on room air.  She reports being sick for the last 2 to 3 days with generalized weakness, fatigue and cough.  Reports diffuse abdominal pain but denies any chest pains or shortness of breath.  The patient was started on oxygen 3 L nasal cannula with saturation going up to 94%.  She denies being in contact with anybody with COVID-19 and had a negative Covid test earlier this month.    Review of Systems    In addition to the HPI above, No Fever-chills, No Headache, No changes with Vision or hearing, No problems swallowing food or Liquids, No Chest pain, or Shortness of Breath, No Blood in stool or Urine, No dysuria, No new skin rashes or bruises, No new joints pains-aches,  No new weakness, tingling, numbness in any extremity, No recent weight gain or loss, No polyuria, polydypsia or polyphagia, No significant Mental Stressors.  A full 10 point Review of Systems was done, except as stated above, all other Review of  Systems were negative.   Social History Social History   Tobacco Use  . Smoking status: Current Every Day Smoker    Packs/day: 1.00    Years: 32.00    Pack years: 32.00    Types: Cigarettes    Last attempt to quit: 09/23/2012    Years since quitting: 6.5  . Smokeless tobacco: Never Used  . Tobacco comment: or less  Substance Use Topics  . Alcohol use: No     Family History Family History  Problem Relation Age of Onset  . Stroke Mother   . Diabetes Mother   . Kidney failure Mother   . Heart failure Mother   . Stroke Father   . Cancer Sister        Breast- 48's     Prior to Admission medications   Medication Sig Start Date End Date Taking? Authorizing Provider  acetaminophen (TYLENOL) 325 MG tablet Take 650 mg by mouth every 4 (four) hours as needed for mild pain.   Yes [provider]  amLODipine (NORVASC) 10 MG tablet Take 10 mg by mouth daily.   Yes [provider]  aspirin 81 MG chewable tablet  Chew 81 mg by mouth daily.   Yes [provider]  atorvastatin (LIPITOR) 40 MG tablet Take 40 mg by mouth at bedtime.    Yes [provider]  diphenhydrAMINE (BANOPHEN) 25 mg capsule Take 25 mg by mouth every 4 (four) hours as needed for itching or allergies.   Yes [provider]  famotidine (PEPCID) 10 MG tablet Take 10 mg by mouth daily.   Yes [provider]  FLUoxetine (PROZAC) 10 MG capsule Take 10 mg by mouth daily.   Yes [provider]  gabapentin (NEURONTIN) 100 MG capsule Take 100 mg by mouth daily.    Yes [provider]  insulin lispro (HUMALOG) 100 UNIT/ML injection Inject 8 Units into the skin See admin instructions. Inject 8 units into the skin three times a day with meals and hold if BGL <150 or if patient is not eating   Yes [provider]  metoprolol tartrate (LOPRESSOR) 100 MG tablet Take 100 mg by mouth 2 (two) times daily.   Yes [provider]  Multiple Vitamins-Minerals (CERTAVITE SENIOR/ANTIOXIDANT) TABS Take 1 tablet by mouth daily.   Yes [provider]  omeprazole (PRILOSEC) 40 MG capsule Take 40 mg by mouth daily.   Yes [provider]  polyethylene glycol powder (GAVILAX) 17 GM/SCOOP powder Take 17 g by mouth See admin instructions. Mix 17 grams of powder into 8 ounces of fluid, stir, and drink (by mouth) once a day   Yes [provider]  SACCHAROMYCES BOULARDII PO Take 1 capsule by mouth 2 (two) times daily.   Yes [provider]  senna-docusate (SENOKOT-S) 8.6-50 MG tablet Take 1 tablet by mouth at bedtime as needed for mild constipation. 06/11/18  Yes Regalado, [name], MD  sodium bicarbonate 650 MG tablet Take 1,300 mg by mouth 2 (two) times daily.   Yes [provider]  Vitamin D, Ergocalciferol, (DRISDOL) 50000 units CAPS capsule Take 1 capsule (50,000 Units total) by mouth every 7 (seven) days. Patient taking differently: Take 50,000 Units by mouth  every Wednesday.  11/21/16  Yes Ghimire, Henreitta Leber, MD  oxyCODONE (ROXICODONE) 5 MG immediate release tablet Take 1 tablet (5 mg total) by mouth every 8 (eight) hours as needed. Patient not taking: Reported on 04/28/2019 04/11/19   Gabriel Earing, PA-C  Allergies  Allergen Reactions  . Hydrocodone Nausea And Vomiting    Listed in Epic, but not on the [facility]    Physical Exam  Vitals  Blood pressure 134/65, pulse 66, temperature 98.4 F (36.9 C), temperature source Oral, resp. rate 18, height 4\' 6"  (1.372 m), weight 46.7 kg, last menstrual period 03/06/2013, SpO2 99 %.    Appearance drowsy, confused, well developed, well nourished HEENT no jaundice or pallor, no facial deviation scleral edema noted Neck supple, no neck vein distention Chest decreased air entry bilaterally with bilateral basilar crackles Heart normal S1-S2, no murmurs gallops or rubs Abdomen soft nontender obese bowel sounds present Extremities no clubbing or cyanosis +1 edema lower extremities bilaterally Skin no rashes or ulcers Neuro grossly nonfocal, patient moving all extremities but drowsy  Data Review  CBC Recent Labs  Lab 04/28/19 0745 04/28/19 0942  WBC 9.0 10.3  HGB 8.7* 9.0*  HCT 28.2* 29.0*  PLT 391 445*  MCV 89.5 89.5  MCH 27.6 27.8  MCHC 30.9 31.0  RDW 15.2 15.5  LYMPHSABS  --  1.4  MONOABS  --  0.7  EOSABS  --  0.1  BASOSABS  --  0.1   ------------------------------------------------------------------------------------------------------------------  Chemistries  Recent Labs  Lab 04/28/19 0745 04/28/19 0942  NA 136 136  K 5.0 5.0  CL 98 96*  CO2 21* 21*  GLUCOSE 123* 118*  BUN 69* 67*  CREATININE 5.40* 5.63*  CALCIUM 8.4* 8.6*  AST  --  93*  ALT  --  54*  ALKPHOS  --  108  BILITOT  --  0.9   ------------------------------------------------------------------------------------------------------------------ estimated creatinine clearance is 6.5 mL/min (A) (by C-G formula  based on SCr of 5.63 mg/dL (H)). ------------------------------------------------------------------------------------------------------------------ Recent Labs    04/28/19 0942  TSH 9.747*     Coagulation profile Recent Labs  Lab 04/28/19 0745  INR 1.4*   ------------------------------------------------------------------------------------------------------------------- Recent Labs    04/28/19 0942  DDIMER 1.90*   -------------------------------------------------------------------------------------------------------------------  Cardiac Enzymes No results for input(s): CKMB, TROPONINI, MYOGLOBIN in the last 168 hours.  Invalid input(s): CK ------------------------------------------------------------------------------------------------------------------ Invalid input(s): POCBNP   ---------------------------------------------------------------------------------------------------------------  Urinalysis    Component Value Date/Time   COLORURINE YELLOW 06/09/2018 1402   APPEARANCEUR CLEAR 06/09/2018 1402   LABSPEC 1.009 06/09/2018 1402   PHURINE 7.0 06/09/2018 1402   GLUCOSEU NEGATIVE 06/09/2018 1402   HGBUR NEGATIVE 06/09/2018 1402   BILIRUBINUR NEGATIVE 06/09/2018 1402   BILIRUBINUR n 05/25/2012 1202   KETONESUR NEGATIVE 06/09/2018 1402   PROTEINUR 100 (A) 06/09/2018 1402   UROBILINOGEN 0.2 05/25/2012 1202   UROBILINOGEN 0.2 03/29/2011 0131   NITRITE NEGATIVE 06/09/2018 1402   LEUKOCYTESUR NEGATIVE 06/09/2018 1402    ----------------------------------------------------------------------------------------------------------------    Imaging results:   Ct Abdomen Pelvis Wo Contrast  Result Date: 04/28/2019 CLINICAL DATA:  58 year old female with abdominal pain, nausea and vomiting. Chronic kidney disease. EXAM: CT ABDOMEN AND PELVIS WITHOUT CONTRAST TECHNIQUE: Multidetector CT imaging of the abdomen and pelvis was performed following the standard protocol without  IV contrast. COMPARISON:  11/09/2016 CT and prior studies FINDINGS: Please note that parenchymal abnormalities may be missed without intravenous contrast. Lower chest: A small to moderate RIGHT pleural effusion with RIGHT basilar atelectasis identified. Hepatobiliary: No definite hepatic or gallbladder abnormalities noted. No biliary dilatation. Pancreas: No acute abnormalities. Calcifications along the unscented process are unchanged. Spleen: Unremarkable Adrenals/Urinary Tract: No significant changes with 3 punctate nonobstructing LEFT renal calculi and mild perinephric stranding. No evidence of hydronephrosis or obstructing urinary calculi identified. The adrenal  glands and bladder are unremarkable. Stomach/Bowel: Stomach is within normal limits. Appendix appears normal. No evidence of bowel wall thickening, distention, or inflammatory changes. Vascular/Lymphatic: Aortic atherosclerosis. No enlarged abdominal or pelvic lymph nodes. Reproductive: Uterus and bilateral adnexa are unremarkable. Other: Small amount of ascites within the abdomen and pelvis identified. Diffuse subcutaneous edema is present. No focal collection or pneumoperitoneum. Musculoskeletal: No acute or suspicious bony abnormalities. IMPRESSION: 1. Small amount of ascites, diffuse subcutaneous edema and small to moderate RIGHT pleural effusion. 2. Nonobstructing LEFT renal calculi 3. Aortic Atherosclerosis (ICD10-I70.0). Electronically Signed   By: Margarette Canada M.D.   On: 04/28/2019 14:12   Ct Head Wo Contrast  Result Date: 04/28/2019 CLINICAL DATA:  58 year old female with altered mental status. EXAM: CT HEAD WITHOUT CONTRAST TECHNIQUE: Contiguous axial images were obtained from the base of the skull through the vertex without intravenous contrast. COMPARISON:  03/10/2018 FINDINGS: Brain: No evidence of acute infarction, hemorrhage, hydrocephalus, extra-axial collection or mass lesion/mass effect. Chronic small-vessel white matter ischemic  changes and remote bilateral basal ganglia infarcts again noted. Vascular: Carotid and vertebral atherosclerotic calcifications again noted. Skull: Normal. Negative for fracture or focal lesion. Sinuses/Orbits: No acute finding. Other: None. IMPRESSION: 1. No evidence of acute intracranial abnormality. 2. Chronic small-vessel white matter ischemic changes and remote bilateral basal ganglia infarcts. Electronically Signed   By: Margarette Canada M.D.   On: 04/28/2019 14:03   Nm Pulmonary Perfusion  Result Date: 04/28/2019 CLINICAL DATA:  Elevated D-dimer.  Concern for pulmonary embolism. EXAM: NUCLEAR MEDICINE PERFUSION LUNG SCAN TECHNIQUE: Perfusion images were obtained in multiple projections after intravenous injection of radiopharmaceutical. RADIOPHARMACEUTICALS:  1.45 mCi Tc-25m MAA COMPARISON:  Chest radiograph FINDINGS: No wedge-shaped peripheral perfusion defects to suggest acute pulmonary embolism. IMPRESSION: No evidence of acute pulmonary embolism. Electronically Signed   By: Suzy Bouchard M.D.   On: 04/28/2019 16:16   Dg Chest Portable 1 View  Result Date: 04/28/2019 CLINICAL DATA:  Altered mental status and lethargy. Shortness of breath. EXAM: PORTABLE CHEST 1 VIEW COMPARISON:  June 10, 2018 FINDINGS: There is a small right pleural effusion with right base atelectasis. There is also mild atelectasis in the left mid lung. The lungs elsewhere are clear. There is cardiomegaly with pulmonary vascularity normal. No adenopathy. No bone lesions. IMPRESSION: Small right pleural effusion with right base atelectasis. There is left midlung atelectasis. There is no frank edema or consolidation. Heart mildly enlarged, stable. Pulmonary vascularity within normal limits. No evident adenopathy. Electronically Signed   By: Lowella Grip III M.D.   On: 04/28/2019 10:19   Dg Abd Portable 2 Views  Result Date: 04/28/2019 CLINICAL DATA:  Distended abdomen, abdominal pain. EXAM: PORTABLE ABDOMEN - 2 VIEW  COMPARISON:  Chest x-ray of 11/08/2016, x-ray also of 06/10/2018 FINDINGS: Heart size is enlarged and there is a right-sided pleural effusion. Chest is incompletely imaged. No free air beneath either right or left hemidiaphragm on today's exam. Limited assessment due to patient body habitus. Bowel gas pattern without signs of obstruction. Paucity of gas noted in the central abdomen. No acute bone finding. IMPRESSION: 1. Right pleural effusion 2. No no signs of free air or bowel obstruction. Electronically Signed   By: Zetta Bills M.D.   On: 04/28/2019 10:23    My personal review of EKG: Rhythm NSR, at 63 bpm with minimal ST depressions noted in the lateral leads   personally reviewed Old Chart from 04/11/2019  Assessment & Plan  End-stage renal disease, usually follows up with Dr.  Johnney Ou , Not on hemodialysis yet Status post placement of AV fistula For hemodialysis catheter in a.m. Nephrology on consult, Dr. Joelyn Oms  Diabetes mellitus type 2 ISS  Hyperlipidemia Continue with Lipitor  Hypoxemia Tested negative for COVID-19 ?  Fluid overload due to renal failure  Hypertension Continue with Lopressor  Depression Continue with Prozac  DVT Prophylaxis Lovenox  AM Labs Ordered, also please review Full Orders  Code Status full  Disposition Plan: Home  Time spent in minutes : More than 40 minutes  Condition GUARDED   @SIGNATURE @

## 2019-04-28 NOTE — ED Triage Notes (Signed)
Call [facility] and ask for Supervisor on call. [phone]

## 2019-04-29 ENCOUNTER — Encounter (HOSPITAL_COMMUNITY): Payer: Self-pay | Admitting: *Deleted

## 2019-04-29 ENCOUNTER — Inpatient Hospital Stay (HOSPITAL_COMMUNITY): Payer: Medicare Other

## 2019-04-29 HISTORY — PX: IR US GUIDE VASC ACCESS RIGHT: IMG2390

## 2019-04-29 HISTORY — PX: IR FLUORO GUIDE CV LINE RIGHT: IMG2283

## 2019-04-29 LAB — CBC
HCT: 27 % — ABNORMAL LOW (ref 36.0–46.0)
Hemoglobin: 8.1 g/dL — ABNORMAL LOW (ref 12.0–15.0)
MCH: 28 pg (ref 26.0–34.0)
MCHC: 30 g/dL (ref 30.0–36.0)
MCV: 93.4 fL (ref 80.0–100.0)
Platelets: 406 10*3/uL — ABNORMAL HIGH (ref 150–400)
RBC: 2.89 MIL/uL — ABNORMAL LOW (ref 3.87–5.11)
RDW: 15.6 % — ABNORMAL HIGH (ref 11.5–15.5)
WBC: 10.7 10*3/uL — ABNORMAL HIGH (ref 4.0–10.5)
nRBC: 0.2 % (ref 0.0–0.2)

## 2019-04-29 LAB — BASIC METABOLIC PANEL
Anion gap: 20 — ABNORMAL HIGH (ref 5–15)
BUN: 69 mg/dL — ABNORMAL HIGH (ref 6–20)
CO2: 17 mmol/L — ABNORMAL LOW (ref 22–32)
Calcium: 8.7 mg/dL — ABNORMAL LOW (ref 8.9–10.3)
Chloride: 104 mmol/L (ref 98–111)
Creatinine, Ser: 5.62 mg/dL — ABNORMAL HIGH (ref 0.44–1.00)
GFR calc Af Amer: 9 mL/min — ABNORMAL LOW (ref >60)
GFR calc non Af Amer: 8 mL/min — ABNORMAL LOW (ref >60)
Glucose, Bld: 102 mg/dL — ABNORMAL HIGH (ref 70–99)
Potassium: 5.4 mmol/L — ABNORMAL HIGH (ref 3.5–5.1)
Sodium: 141 mmol/L (ref 135–145)

## 2019-04-29 LAB — GLUCOSE, CAPILLARY
Glucose-Capillary: 99 mg/dL (ref 70–99)
Glucose-Capillary: 107 mg/dL — ABNORMAL HIGH (ref 70–99)
Glucose-Capillary: 66 mg/dL — ABNORMAL LOW (ref 70–99)
Glucose-Capillary: 170 mg/dL — ABNORMAL HIGH (ref 70–99)

## 2019-04-29 LAB — HIV ANTIBODY (ROUTINE TESTING W REFLEX): HIV Screen 4th Generation wRfx: REACTIVE — AB

## 2019-04-29 LAB — MRSA PCR SCREENING: MRSA by PCR: POSITIVE — AB

## 2019-04-29 MED ORDER — HEPARIN SODIUM (PORCINE) 1000 UNIT/ML IJ SOLN
INTRAMUSCULAR | Status: AC
  Filled 2019-04-29: qty 1

## 2019-04-29 MED ORDER — HEPARIN SODIUM (PORCINE) 5000 UNIT/ML IJ SOLN: 5000.0000 [IU] | Freq: Two times a day (BID) | INTRAMUSCULAR | Status: DC

## 2019-04-29 MED ORDER — CEFAZOLIN SODIUM-DEXTROSE 2-4 GM/100ML-% IV SOLN
INTRAVENOUS | Status: AC
  Filled 2019-04-29: qty 100

## 2019-04-29 MED ORDER — LIDOCAINE HCL 1 % IJ SOLN
INTRAMUSCULAR | Status: AC
  Filled 2019-04-29: qty 20

## 2019-04-29 MED ORDER — GELATIN ABSORBABLE 12-7 MM EX MISC
CUTANEOUS | Status: AC
  Filled 2019-04-29: qty 1

## 2019-04-29 MED ORDER — ORAL CARE MOUTH RINSE
15.0000 mL | Freq: Two times a day (BID) | OROMUCOSAL | Status: DC
  Administered 2019-04-29 – 2019-05-03 (×7): 15 mL via OROMUCOSAL

## 2019-04-29 MED ORDER — MUPIROCIN 2 % EX OINT
1.0000 "application " | TOPICAL_OINTMENT | Freq: Two times a day (BID) | CUTANEOUS | Status: AC
  Administered 2019-04-29 – 2019-05-03 (×10): 1 via NASAL
  Filled 2019-04-29 (×4): qty 22

## 2019-04-29 MED ORDER — LIDOCAINE HCL (PF) 1 % IJ SOLN
INTRAMUSCULAR | Status: AC | PRN
  Administered 2019-04-29: 5 mL

## 2019-04-29 MED ORDER — HEPARIN SODIUM (PORCINE) 5000 UNIT/ML IJ SOLN
5000.0000 [IU] | Freq: Three times a day (TID) | INTRAMUSCULAR | Status: DC
  Administered 2019-04-29 – 2019-05-04 (×14): 5000 [IU] via SUBCUTANEOUS
  Filled 2019-04-29 (×15): qty 1

## 2019-04-29 MED ORDER — SODIUM CHLORIDE 0.9 % IV SOLN
INTRAVENOUS | Status: AC | PRN
  Administered 2019-04-29: 10 mL/h via INTRAVENOUS

## 2019-04-29 MED ORDER — CEFAZOLIN SODIUM-DEXTROSE 1-4 GM/50ML-% IV SOLN
INTRAVENOUS | Status: AC | PRN
  Administered 2019-04-29: 2 g via INTRAVENOUS

## 2019-04-29 MED ORDER — CHLORHEXIDINE GLUCONATE CLOTH 2 % EX PADS
6.0000 | MEDICATED_PAD | Freq: Every day | CUTANEOUS | Status: AC
  Administered 2019-04-29 – 2019-05-03 (×5): 6 via TOPICAL

## 2019-04-29 MED ORDER — SODIUM CHLORIDE 0.9 % IV SOLN
250.0000 mg | Freq: Every day | INTRAVENOUS | Status: DC
  Administered 2019-04-29 – 2019-05-01 (×3): 250 mg via INTRAVENOUS
  Filled 2019-04-29 (×3): qty 20

## 2019-04-29 NOTE — Sedation Documentation (Signed)
Writer will not be sedating pt d/t low oxygen saturation. Writer has placed pt on Non rebreather and 12-15 L of O2. . Team aware, will continue to monitor closely

## 2019-04-29 NOTE — TOC Initial Note (Signed)
Transition of Care [facility]) - Initial/Assessment Note    Patient Details  Name: [name] Michael MRN: [id number] Date of Birth: 26-Aug-1960  [facility] CM/SW Contact:    Marilu Favre, RN Phone Number: 04/29/2019, 1:49 PM  Clinical Narrative:                 Patient from [facility] assisted living , plans to return.   For HD cath placement and hemodialysis will continue to follow.  Expected Discharge Plan: Assisted Living Barriers to Discharge: Continued Medical Work up   Patient Goals and CMS Choice Patient states their goals for this hospitalization and ongoing recovery are:: to return to [employer].gov Compare Post Acute Care list provided to:: Patient    Expected Discharge Plan and Services Expected Discharge Plan: Assisted Living       Living arrangements for the past 2 months: [place]                                      Prior Living Arrangements/Services Living arrangements for the past 2 months: [place] Lives with:: Self Patient language and need for interpreter reviewed:: Yes Do you feel safe going back to the place where you live?: Yes            Criminal Activity/Legal Involvement Pertinent to Current Situation/Hospitalization: No - Comment as needed  Activities of Daily Living Home Assistive Devices/Equipment: CBG Meter, Cane (specify quad or straight), Walker (specify type) ADL Screening (condition at time of admission) Patient's cognitive ability adequate to safely complete daily activities?: Yes Is the patient deaf or have difficulty hearing?: No Does the patient have difficulty seeing, even when wearing glasses/contacts?: No Does the patient have difficulty concentrating, remembering, or making decisions?: No Patient able to express need for assistance with ADLs?: Yes Does the patient have difficulty dressing or bathing?: No Independently performs ADLs?: Yes (appropriate for  developmental age) Does the patient have difficulty walking or climbing stairs?: Yes Weakness of Legs: Both Weakness of Arms/Hands: Both  Permission Sought/Granted   Permission granted to share information with : No              Emotional Assessment Appearance:: Appears older than stated age Attitude/Demeanor/Rapport: Engaged Affect (typically observed): Accepting Orientation: : Oriented to Self, Oriented to Place, Oriented to  Time, Oriented to Situation Alcohol / Substance Use: Not Applicable Psych Involvement: No (comment)  Admission diagnosis:  Hypoxia [R09.02] ESRD (end stage renal disease) ([place]) [N18.6] Altered mental status, unspecified altered mental status type [R41.82] Hypoxemia [R09.02] Patient Active Problem List   Diagnosis Date Noted  . AMS (altered mental status) 04/28/2019  . Hypoxemia 04/28/2019  . Fall 06/09/2018  . Fall at home, initial encounter 06/09/2018  . Anemia of chronic disease 06/09/2018  . Syncope and collapse 06/09/2018  . CKD (chronic kidney disease) stage 5, GFR less than 15 ml/min (HCC) 03/10/2018  . Acute encephalopathy 03/10/2018  . Hypermagnesemia 03/10/2018  . AKI (acute kidney injury) ([place]) 11/09/2016  . Hypercalcemia 11/09/2016  . Hyponatremia 11/09/2016  . Hypovolemia 11/09/2016  . Accelerated hypertension 11/09/2016  . Acute lower UTI 11/09/2016  . Nephrolithiasis 10/24/2016  . Constipation 10/22/2016  . CAP (community acquired pneumonia) 10/22/2016  . Hydronephrosis of right kidney 10/22/2016  . Metabolic acidosis 0000000  . CVA (cerebral vascular accident) ([place]) 07/03/2016  . HAP (hospital-acquired pneumonia) 07/03/2016  . Sepsis  secondary to UTI ([place])   . UTI due to extended-spectrum beta lactamase (ESBL) producing Escherichia coli   . Acute pyelonephritis   . Bacteremia due to Escherichia coli   . Colitis, indeterminate   . Uncontrolled type 2 diabetes mellitus with complication ([place])   . Diabetic retinopathy of  both eyes with macular edema associated with diabetes mellitus due to underlying condition ([place])   . Chronic diastolic CHF (congestive heart failure) ([place])   . Abdominal pain   . Colitis   . Hypophosphatemia 06/12/2016  . Hypokalemia 06/11/2016  . Hypocalcemia 06/11/2016  . Acute kidney injury superimposed on chronic kidney disease ([place]) 06/11/2016  . Proliferative diabetic retinopathy ([place]) 04/29/2016  . Closed fracture of left distal radius 01/28/2016  . Poor social situation 03/09/2013  . Depression 03/01/2013  . Abnormal mammogram 12/20/2012  . Retinal detachment 11/17/2012  . 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 ([place]) [id number]  . Hyperlipidemia 02/09/2007  . Essential hypertension 02/09/2007  . GERD 02/09/2007   PCP:  Charlott Rakes, MD Pharmacy:   [facility], [place] - [address] [place] [address] Phone: [phone] Fax: [phone]     Social Determinants of Health (SDOH) Interventions    Readmission Risk Interventions Readmission Risk Prevention Plan 04/29/2019  Transportation Screening Complete  PCP or Specialist Appt within 3-5 Days Complete  HRI or [place] Complete  Social Work Consult for [place] Planning/Counseling Complete  Palliative Care Screening Not Applicable  Medication Review Press photographer) Referral to Pharmacy  Some recent data might be hidden

## 2019-04-29 NOTE — Procedures (Signed)
Interventional Radiology Procedure Note  Procedure: Tunneled HD catheter placement  Complications: None  Estimated Blood Loss: < 10 mL  Findings: Right IJ Palindrome catheter placed, 19 cm tip to cuff. Tip in RA. OK to use.  Venetia Night. Kathlene Cote, M.D Pager:  [phone]

## 2019-04-29 NOTE — Progress Notes (Signed)
Hypoglycemic Event  CBG: 66  Treatment: 4 oz juice/soda  Symptoms: Shaky  Follow-up CBG: [id number] CBG Result:107  Possible Reasons for Event: Inadequate meal intake  Comments/MD notified: Dr. Lorenza Evangelist

## 2019-04-29 NOTE — Progress Notes (Addendum)
PROGRESS NOTE    [name] Michael  [id number] DOB: [birth year] DOA: 04/28/2019 PCP: Charlott Rakes, MD     Brief Narrative:  [name] Michael is a 58 y.o. female with past medical history of end-stage renal disease in preparation for dialysis who came to outpatient surgery for scheduled tunneled hemodialysis catheter.  Patient has new left AV fistula placed on 8/31-second stage on 10/19 by Dr. Carlis Abbott. Patient was noted to be lethargic with saturation of 80% on room air.  She reports being sick for the last 2 to 3 days with generalized weakness, fatigue and cough.  Reports diffuse abdominal pain but denies any chest pains or shortness of breath.  The patient was started on oxygen 3 L nasal cannula with saturation going up to 94%.  She denies being in contact with anybody with COVID-19 and had a negative Covid test earlier this month. She was referred to the ED.   New events last 24 hours / Subjective: Feeling better but still SOB.   Assessment & Plan:   Active Problems:   AMS (altered mental status)   Hypoxemia   ESRD -Status post placement of AV fistula -Plan for tunneled dialysis catheter in preparation for hemodialysis during this admission -Nephrology following  Acute hypoxemic respiratory failure -Thought to be secondary to fluid overload from ESRD -Covid 19 negative  -Continue nasal cannula O2   Type 2 diabetes, well controlled -Ha1c 6.5  -SSI  Hyperlipidemia -Continue Lipitor  Hypertension -Continue Lopressor, amlodipine   Depression -Continue Prozac  HIV positive -HIV screening test obtained on this admission showed HIV positive.  ID has ordered for HIV RNA   DVT prophylaxis: SCD Code Status: Full code Family Communication: None at bedside Disposition Plan: Pending dialysis   Consultants:   Nephrology  IR  Procedures:   None  Antimicrobials:  Anti-infectives (From admission, onward)   None        Objective: Vitals:   04/29/19 0041  04/29/19 0530 04/29/19 1033 04/29/19 1227  BP:  (!) 108/38 138/65 (!) 113/55  Pulse: 61 63 66 66  Resp:  16  16  Temp:  (!) 97.4 F (36.3 C)  98.9 F (37.2 C)  TempSrc:  Oral  Oral  SpO2: 97% 98%  90%  Weight:      Height:        Intake/Output Summary (Last 24 hours) at 04/29/2019 1406 Last data filed at 04/29/2019 0500 Gross per 24 hour  Intake 1370 ml  Output 150 ml  Net 1220 ml   Filed Weights   04/28/19 0921 04/28/19 2041  Weight: 46.7 kg 51.6 kg    Examination:  General exam: Appears calm and comfortable  Respiratory system: Clear to auscultation. Respiratory effort normal. No respiratory distress. No conversational dyspnea.  Cardiovascular system: S1 & S2 heard, RRR. No murmurs. No pedal edema. Gastrointestinal system: Abdomen is nondistended, soft and nontender. Normal bowel sounds heard. Central nervous system: Alert and oriented. No focal neurological deficits. Speech clear.  Extremities: Symmetric in appearance  Skin: No rashes, lesions or ulcers on exposed skin  Psychiatry: Judgement and insight appear normal. Mood & affect appropriate.   Data Reviewed: I have personally reviewed following labs and imaging studies  CBC: Recent Labs  Lab 04/28/19 0745 04/28/19 0942 04/29/19 0631  WBC 9.0 10.3 10.7*  NEUTROABS  --  8.0*  --   HGB 8.7* 9.0* 8.1*  HCT 28.2* 29.0* 27.0*  MCV 89.5 89.5 93.4  PLT 391 445* [id number]*   Basic Metabolic Panel:  Recent Labs  Lab 04/28/19 0745 04/28/19 0942 04/29/19 0631  NA 136 136 141  K 5.0 5.0 5.4*  CL 98 96* 104  CO2 21* 21* 17*  GLUCOSE 123* 118* 102*  BUN 69* 67* 69*  CREATININE 5.40* 5.63* 5.62*  CALCIUM 8.4* 8.6* 8.7*   GFR: Estimated Creatinine Clearance: 6.8 mL/min (A) (by C-G formula based on SCr of 5.62 mg/dL (H)). Liver Function Tests: Recent Labs  Lab 04/28/19 0942  AST 93*  ALT 54*  ALKPHOS 108  BILITOT 0.9  PROT 7.0  ALBUMIN 3.7   No results for input(s): LIPASE, AMYLASE in the last 168  hours. Recent Labs  Lab 04/28/19 0942  AMMONIA 24   Coagulation Profile: Recent Labs  Lab 04/28/19 0745  INR 1.4*   Cardiac Enzymes: No results for input(s): CKTOTAL, CKMB, CKMBINDEX, TROPONINI in the last 168 hours. BNP (last 3 results) No results for input(s): PROBNP in the last 8760 hours. HbA1C: Recent Labs    04/28/19 2144  HGBA1C 6.5*   CBG: Recent Labs  Lab 04/28/19 0926 04/28/19 2202 04/29/19 0534 04/29/19 0742 04/29/19 0811  GLUCAP 102* 89 99 66* 107*   Lipid Profile: No results for input(s): CHOL, HDL, LDLCALC, TRIG, CHOLHDL, LDLDIRECT in the last 72 hours. Thyroid Function Tests: Recent Labs    04/28/19 0942  TSH 9.747*   Anemia Panel: No results for input(s): VITAMINB12, FOLATE, FERRITIN, TIBC, IRON, RETICCTPCT in the last 72 hours. Sepsis Labs: Recent Labs  Lab 04/28/19 0942 04/28/19 1200  LATICACIDVEN 1.5 1.1    Recent Results (from the past 240 hour(s))  Blood culture (routine x 2)     Status: None (Preliminary result)   Collection Time: 04/28/19 10:17 AM   Specimen: BLOOD  Result Value Ref Range Status   Specimen Description BLOOD RIGHT ANTECUBITAL  Final   Special Requests   Final    BOTTLES DRAWN AEROBIC AND ANAEROBIC Blood Culture adequate volume   Culture   Final    NO GROWTH 1 DAY Performed at [facility], [place] [address] [place], [place] [address]    Report Status PENDING  Incomplete  SARS CORONAVIRUS 2 (TAT 6-24 HRS) Nasopharyngeal Nasopharyngeal Swab     Status: None   Collection Time: 04/28/19 10:51 AM   Specimen: Nasopharyngeal Swab  Result Value Ref Range Status   SARS Coronavirus 2 NEGATIVE NEGATIVE Final    Comment: (NOTE) SARS-CoV-2 target nucleic acids are NOT DETECTED. The SARS-CoV-2 RNA is generally detectable in upper and lower respiratory specimens during the acute phase of infection. Negative results do not preclude SARS-CoV-2 infection, do not rule out co-infections with other pathogens, and should  not be used as the sole basis for treatment or other patient management decisions. Negative results must be combined with clinical observations, patient history, and epidemiological information. The expected result is Negative. Fact Sheet for Patients: SugarRoll.be Fact Sheet for Healthcare Providers: https://www.woods-mathews.com/ This test is not yet approved or cleared by the [place] FDA and  has been authorized for detection and/or diagnosis of SARS-CoV-2 by FDA under an Emergency Use Authorization (EUA). This EUA will remain  in effect (meaning this test can be used) for the duration of the COVID-19 declaration under Section 56 4(b)(1) of the Act, 21 U.S.C. section 360bbb-3(b)(1), unless the authorization is terminated or revoked sooner. Performed at [facility], [place] [address] [place], [place] [address]   MRSA PCR Screening     Status: Abnormal   Collection Time: 04/28/19  9:02 PM   Specimen: Nasal Mucosa; Nasopharyngeal  Result Value Ref Range Status   MRSA by PCR POSITIVE (A) NEGATIVE Final    Comment:        The GeneXpert MRSA Assay (FDA approved for NASAL specimens only), is one component of a comprehensive MRSA colonization surveillance program. It is not intended to diagnose MRSA infection nor to guide or monitor treatment for MRSA infections. RESULT CALLED TO, READ BACK BY AND VERIFIED WITH: Mart Piggs [id number] 04/29/2019 Mena Goes Performed at [facility], [place] [address] [place], [place] [address]   Blood culture (routine x 2)     Status: None (Preliminary result)   Collection Time: 04/28/19  9:44 PM   Specimen: BLOOD RIGHT ARM  Result Value Ref Range Status   Specimen Description BLOOD RIGHT ARM  Final   Special Requests   Final    BOTTLES DRAWN AEROBIC ONLY Blood Culture results may not be optimal due to an inadequate volume of blood received in culture bottles   Culture   Final    NO GROWTH <  24 HOURS Performed at [facility], [place] [address], [place], [place] [address]    Report Status PENDING  Incomplete      Radiology Studies: Ct Abdomen Pelvis Wo Contrast  Result Date: 04/28/2019 CLINICAL DATA:  58 year old female with abdominal pain, nausea and vomiting. Chronic kidney disease. EXAM: CT ABDOMEN AND PELVIS WITHOUT CONTRAST TECHNIQUE: Multidetector CT imaging of the abdomen and pelvis was performed following the standard protocol without IV contrast. COMPARISON:  11/09/2016 CT and prior studies FINDINGS: Please note that parenchymal abnormalities may be missed without intravenous contrast. Lower chest: A small to moderate RIGHT pleural effusion with RIGHT basilar atelectasis identified. Hepatobiliary: No definite hepatic or gallbladder abnormalities noted. No biliary dilatation. Pancreas: No acute abnormalities. Calcifications along the unscented process are unchanged. Spleen: Unremarkable Adrenals/Urinary Tract: No significant changes with 3 punctate nonobstructing LEFT renal calculi and mild perinephric stranding. No evidence of hydronephrosis or obstructing urinary calculi identified. The adrenal glands and bladder are unremarkable. Stomach/Bowel: Stomach is within normal limits. Appendix appears normal. No evidence of bowel wall thickening, distention, or inflammatory changes. Vascular/Lymphatic: Aortic atherosclerosis. No enlarged abdominal or pelvic lymph nodes. Reproductive: Uterus and bilateral adnexa are unremarkable. Other: Small amount of ascites within the abdomen and pelvis identified. Diffuse subcutaneous edema is present. No focal collection or pneumoperitoneum. Musculoskeletal: No acute or suspicious bony abnormalities. IMPRESSION: 1. Small amount of ascites, diffuse subcutaneous edema and small to moderate RIGHT pleural effusion. 2. Nonobstructing LEFT renal calculi 3. Aortic Atherosclerosis (ICD10-I70.0). Electronically Signed   By: Margarette Canada M.D.   On: 04/28/2019  14:12   Ct Head Wo Contrast  Result Date: 04/28/2019 CLINICAL DATA:  58 year old female with altered mental status. EXAM: CT HEAD WITHOUT CONTRAST TECHNIQUE: Contiguous axial images were obtained from the base of the skull through the vertex without intravenous contrast. COMPARISON:  03/10/2018 FINDINGS: Brain: No evidence of acute infarction, hemorrhage, hydrocephalus, extra-axial collection or mass lesion/mass effect. Chronic small-vessel white matter ischemic changes and remote bilateral basal ganglia infarcts again noted. Vascular: Carotid and vertebral atherosclerotic calcifications again noted. Skull: Normal. Negative for fracture or focal lesion. Sinuses/Orbits: No acute finding. Other: None. IMPRESSION: 1. No evidence of acute intracranial abnormality. 2. Chronic small-vessel white matter ischemic changes and remote bilateral basal ganglia infarcts. Electronically Signed   By: Margarette Canada M.D.   On: 04/28/2019 14:03   Nm Pulmonary Perfusion  Result Date: 04/28/2019 CLINICAL  DATA:  Elevated D-dimer.  Concern for pulmonary embolism. EXAM: NUCLEAR MEDICINE PERFUSION LUNG SCAN TECHNIQUE: Perfusion images were obtained in multiple projections after intravenous injection of radiopharmaceutical. RADIOPHARMACEUTICALS:  1.45 mCi Tc-71m MAA COMPARISON:  Chest radiograph FINDINGS: No wedge-shaped peripheral perfusion defects to suggest acute pulmonary embolism. IMPRESSION: No evidence of acute pulmonary embolism. Electronically Signed   By: Suzy Bouchard M.D.   On: 04/28/2019 16:16   Dg Chest Portable 1 View  Result Date: 04/28/2019 CLINICAL DATA:  Altered mental status and lethargy. Shortness of breath. EXAM: PORTABLE CHEST 1 VIEW COMPARISON:  June 10, 2018 FINDINGS: There is a small right pleural effusion with right base atelectasis. There is also mild atelectasis in the left mid lung. The lungs elsewhere are clear. There is cardiomegaly with pulmonary vascularity normal. No adenopathy. No bone  lesions. IMPRESSION: Small right pleural effusion with right base atelectasis. There is left midlung atelectasis. There is no frank edema or consolidation. Heart mildly enlarged, stable. Pulmonary vascularity within normal limits. No evident adenopathy. Electronically Signed   By: Lowella Grip III M.D.   On: 04/28/2019 10:19   Dg Abd Portable 2 Views  Result Date: 04/28/2019 CLINICAL DATA:  Distended abdomen, abdominal pain. EXAM: PORTABLE ABDOMEN - 2 VIEW COMPARISON:  Chest x-ray of 11/08/2016, x-ray also of 06/10/2018 FINDINGS: Heart size is enlarged and there is a right-sided pleural effusion. Chest is incompletely imaged. No free air beneath either right or left hemidiaphragm on today's exam. Limited assessment due to patient body habitus. Bowel gas pattern without signs of obstruction. Paucity of gas noted in the central abdomen. No acute bone finding. IMPRESSION: 1. Right pleural effusion 2. No no signs of free air or bowel obstruction. Electronically Signed   By: Zetta Bills M.D.   On: 04/28/2019 10:23      Scheduled Meds:  amLODipine  10 mg Oral Daily   aspirin EC  81 mg Oral Daily   atorvastatin  40 mg Oral QPM   Chlorhexidine Gluconate Cloth  6 each Topical Q0600   famotidine  20 mg Oral Daily   FLUoxetine  10 mg Oral Daily   gabapentin  100 mg Oral BID   heparin  5,000 Units Subcutaneous Q8H   insulin aspart  0-5 Units Subcutaneous QHS   insulin aspart  0-9 Units Subcutaneous TID WC   mouth rinse  15 mL Mouth Rinse BID   metoprolol tartrate  100 mg Oral BID   mupirocin ointment  1 application Nasal BID   pantoprazole  40 mg Oral Daily   polyethylene glycol  17 g Oral Daily   sodium bicarbonate  1,300 mg Oral BID   sodium chloride flush  3 mL Intravenous Q12H   Continuous Infusions:  sodium chloride     sodium chloride     sodium chloride     ferric gluconate (FERRLECIT/NULECIT) IV 250 mg (04/29/19 1032)     LOS: 1 day      Time spent: 35  minutes   Dessa Phi, DO [facility] 04/29/2019, 2:06 PM   Available via Epic secure chat 7am-7pm After these hours, please refer to coverage provider listed on [email or web address]

## 2019-04-29 NOTE — Plan of Care (Signed)
Discussed with patient plan of care for the evening, pain management, and admission procedures with some teach back displayed.

## 2019-04-29 NOTE — Consult Note (Addendum)
[name] Michael Renal Consultation Note    Indication for Consultation:  Management of ESRD/hemodialysis; anemia, hypertension/volume and secondary hyperparathyroidism  Primary nephrologist: [name] Michael  HPI: [name] Michael is a 58 y.o. female with PMH CKD stage V, DM Type 2, HTN, HLD, CHF, prior CVA. History also notable for recurrent hospital admissions with UTI, AKI 2/2 ATN/shock. Resides in ALF.   Previously followed at [facility] by [name] Michael in 2018, then lost to follow up and not seen again until 11/2018 by Dr. Johnney Michael.  Last follow up visit at [place] on 04/19/19, at that time presented with uremic symptoms and CLIP process started for OP dialysis.   She is s/p left 2nd stage BVT by Dr. Carlis Abbott on 04/11/19. Felt AVF would not be mature for cannulation so was scheduled  for OP cathter placement by IR but on arrival yesterday she was lethargic and hypoxic so now admitted. CXR shows small right pleural effusion, no frank edema or infiltrates. Nuclear scan negative for PE. Head CT negative. Labs: Na 141, K 5.4, CO2 17, BUN 69 Cr 5.62, WBC 10.7, Hgb 8.1.   Nephrology has been consulted for dialysis needs. IR plans for catheter placement today.   Seen and examined at bedside. Endorses SOB on 3L [place]. Feeling bad since Monday. Endorses nauseas, vomiting, abd pain. Plan for 1st HD after cathter placement today.   Past Medical History:  Diagnosis Date  . Ambulates with cane   . CKD (chronic kidney disease)   . Constipation   . Diabetes mellitus    type 2  . GERD (gastroesophageal reflux disease)   . Hyperlipidemia   . Hypertension   . Osteoporosis   . Stroke [facility]) 04-01-11   left frontal subcortical, saw Dr. Leonie Michael   . TIA (transient ischemic attack) 03-12-11  . Vitamin D deficiency    Past Surgical History:  Procedure Laterality Date  . AV FISTULA PLACEMENT Left 02/21/2019   Procedure: BRACHIOCEPHALIC ARTERIOVENOUS (AV) FISTULA CREATION;  Surgeon: [name] Heck, MD;  Location: [place];   Service: Vascular;  Laterality: Left;  . BASCILIC VEIN TRANSPOSITION Left 04/11/2019   Procedure: SECOND STAGE BASILIC VEIN TRANSPOSITION LEFT ARM;  Surgeon: [name] Heck, MD;  Location: [place];  Service: Vascular;  Laterality: Left;  . OPEN REDUCTION INTERNAL FIXATION (ORIF) DISTAL RADIAL FRACTURE Left 01/28/2016   Procedure: OPEN REDUCTION INTERNAL FIXATION (ORIF) DISTAL RADIAL FRACTURE;  Surgeon: [name] Planas, MD;  Location: [place];  Service: Orthopedics;  Laterality: Left;  . [name]     age 78   Family History  Problem Relation Age of Onset  . Stroke Mother   . Diabetes Mother   . Kidney failure Mother   . Heart failure Mother   . Stroke Father   . Cancer Sister        Breast- 7's   Social History:  reports that she has been smoking cigarettes. She has a 32.00 pack-year smoking history. She has never used smokeless tobacco. She reports that she does not drink alcohol or use drugs. Allergies  Allergen Reactions  . Hydrocodone Nausea And Vomiting    Listed in Epic, but not on the [facility]   Prior to Admission medications   Medication Sig Start Date End Date Taking? Authorizing Provider  acetaminophen (TYLENOL) 325 MG tablet Take 650 mg by mouth every 4 (four) hours as needed for mild pain.   Yes [provider]  amLODipine (NORVASC)  10 MG tablet Take 10 mg by mouth daily.   Yes [provider]  aspirin 81 MG chewable tablet Chew 81 mg by mouth daily.   Yes [provider]  atorvastatin (LIPITOR) 40 MG tablet Take 40 mg by mouth at bedtime.    Yes [provider]  diphenhydrAMINE (BANOPHEN) 25 mg capsule Take 25 mg by mouth every 4 (four) hours as needed for itching or allergies.   Yes [provider]  famotidine (PEPCID) 10 MG tablet Take 10 mg by mouth daily.   Yes [provider]  FLUoxetine (PROZAC) 10 MG capsule Take 10 mg by mouth daily.   Yes  [provider]  gabapentin (NEURONTIN) 100 MG capsule Take 100 mg by mouth daily.    Yes [provider]  insulin lispro (HUMALOG) 100 UNIT/ML injection Inject 8 Units into the skin See admin instructions. Inject 8 units into the skin three times a day with meals and hold if BGL <150 or if patient is not eating   Yes [provider]  metoprolol tartrate (LOPRESSOR) 100 MG tablet Take 100 mg by mouth 2 (two) times daily.   Yes [provider]  Multiple Vitamins-Minerals (CERTAVITE SENIOR/ANTIOXIDANT) TABS Take 1 tablet by mouth daily.   Yes [provider]  omeprazole (PRILOSEC) 40 MG capsule Take 40 mg by mouth daily.   Yes [provider]  polyethylene glycol powder (GAVILAX) 17 GM/SCOOP powder Take 17 g by mouth See admin instructions. Mix 17 grams of powder into 8 ounces of fluid, stir, and drink (by mouth) once a day   Yes [provider]  SACCHAROMYCES BOULARDII PO Take 1 capsule by mouth 2 (two) times daily.   Yes [provider]  senna-docusate (SENOKOT-S) 8.6-50 MG tablet Take 1 tablet by mouth at bedtime as needed for mild constipation. 06/11/18  Yes Michael, [name] A, MD  sodium bicarbonate 650 MG tablet Take 1,300 mg by mouth 2 (two) times daily.   Yes [provider]  Vitamin D, Ergocalciferol, (DRISDOL) 50000 units CAPS capsule Take 1 capsule (50,000 Units total) by mouth every 7 (seven) days. Patient taking differently: Take 50,000 Units by mouth every Wednesday.  11/21/16  Yes [name], Henreitta Leber, MD  oxyCODONE (ROXICODONE) 5 MG immediate release tablet Take 1 tablet (5 mg total) by mouth every 8 (eight) hours as needed. Patient not taking: Reported on 04/28/2019 04/11/19   Gabriel Earing, PA-C   Current Facility-Administered Medications  Medication Dose Route Frequency Provider Last Rate Last Dose  . 0.9 %  sodium chloride infusion  250 mL Intravenous PRN Merton Border, MD      . 0.9 %  sodium chloride  infusion  100 mL Intravenous PRN Pearson Grippe B, MD      . 0.9 %  sodium chloride infusion  100 mL Intravenous PRN Pearson Grippe B, MD      . acetaminophen (TYLENOL) tablet 650 mg  650 mg Oral Q4H PRN Merton Border, MD      . alteplase (CATHFLO ACTIVASE) injection 2 mg  2 mg Intracatheter Once PRN Pearson Grippe B, MD      . amLODipine (NORVASC) tablet 10 mg  10 mg Oral Daily Merton Border, MD   10 mg at 04/29/19 1033  . aspirin EC tablet 81 mg  81 mg Oral Daily Merton Border, MD      . atorvastatin (LIPITOR) tablet 40 mg  40 mg Oral QPM Merton Border, MD      .  Chlorhexidine Gluconate Cloth 2 % PADS 6 each  6 each Topical Q0600 Merton Border, MD   6 each at 04/29/19 1035  . diphenhydrAMINE (BENADRYL) capsule 25 mg  25 mg Oral Q4H PRN Merton Border, MD      . famotidine (PEPCID) tablet 20 mg  20 mg Oral Daily Blount, Xenia T, NP   20 mg at 04/29/19 1033  . ferric gluconate (NULECIT) 250 mg in sodium chloride 0.9 % 100 mL IVPB  250 mg Intravenous Daily Dessa Phi, DO 120 mL/hr at 04/29/19 1032 250 mg at 04/29/19 1032  . FLUoxetine (PROZAC) capsule 10 mg  10 mg Oral Daily Merton Border, MD   10 mg at 04/29/19 1033  . gabapentin (NEURONTIN) capsule 100 mg  100 mg Oral BID Merton Border, MD   100 mg at 04/29/19 1035  . heparin injection 1,000 Units  1,000 Units Dialysis PRN Pearson Grippe B, MD      . heparin injection 5,000 Units  5,000 Units Subcutaneous Q8H Dessa Phi, DO      . insulin aspart (novoLOG) injection 0-5 Units  0-5 Units Subcutaneous QHS Merton Border, MD      . insulin aspart (novoLOG) injection 0-9 Units  0-9 Units Subcutaneous TID WC Merton Border, MD      . MEDLINE mouth rinse  15 mL Mouth Rinse BID Merton Border, MD   15 mL at 04/29/19 1036  . metoprolol tartrate (LOPRESSOR) tablet 100 mg  100 mg Oral BID Merton Border, MD   100 mg at 04/29/19 1034  . mupirocin ointment (BACTROBAN) 2 % 1 application  1 application Nasal BID Merton Border, MD   1 application at [id number] 1036  . ondansetron (ZOFRAN)  tablet 4 mg  4 mg Oral Q6H PRN Merton Border, MD       Or  . ondansetron (ZOFRAN) injection 4 mg  4 mg Intravenous Q6H PRN Merton Border, MD      . oxyCODONE (Oxy IR/ROXICODONE) immediate release tablet 5 mg  5 mg Oral Q4H PRN Merton Border, MD      . pantoprazole (PROTONIX) EC tablet 40 mg  40 mg Oral Daily Merton Border, MD   40 mg at 04/29/19 1035  . polyethylene glycol (MIRALAX / GLYCOLAX) packet 17 g  17 g Oral Daily Hijazi, Deatra Canter, MD      . senna-docusate (Senokot-S) tablet 1 tablet  1 tablet Oral QHS PRN Merton Border, MD      . sodium bicarbonate tablet 1,300 mg  1,300 mg Oral BID Merton Border, MD   1,300 mg at 04/29/19 1033  . sodium chloride flush (NS) 0.9 % injection 3 mL  3 mL Intravenous Q12H Merton Border, MD   3 mL at 04/29/19 1036  . sodium chloride flush (NS) 0.9 % injection 3 mL  3 mL Intravenous PRN Merton Border, MD         ROS: As per HPI otherwise negative.  Physical Exam: Vitals:   04/28/19 2041 04/29/19 0041 04/29/19 0530 04/29/19 1033  BP: (!) 146/69  (!) 108/38 138/65  Pulse: 66 61 63 66  Resp: 16  16   Temp: 97.8 F (36.6 C)  (!) 97.4 F (36.3 C)   TempSrc: Oral  Oral   SpO2: (!) 85% 97% 98%   Weight: 51.6 kg     Height: 4\' 6"  (1.372 m)        General: WNWD female NAD on nasal oxygen  Head: NCAT sclera not icteric MMM Neck: Supple. No JVD  appreciated  Lungs: CTA bilaterally without wheezes, rales, or rhonchi. Breathing is unlabored. Heart: RRR with S1 S2 Abdomen: soft NT + BS Lower extremities:without edema or ischemic changes, no open wounds  Neuro: A & O  X 3. Moves all extremities spontaneously. Psych:  Responds to questions appropriately with a normal affect. Dialysis Access: [name] BVT +bruit   Labs: Basic Metabolic Panel: Recent Labs  Lab 04/28/19 0745 04/28/19 0942 04/29/19 0631  NA 136 136 141  K 5.0 5.0 5.4*  CL 98 96* 104  CO2 21* 21* 17*  GLUCOSE 123* 118* 102*  BUN 69* 67* 69*  CREATININE 5.40* 5.63* 5.62*  CALCIUM 8.4* 8.6* 8.7*   Liver  Function Tests: Recent Labs  Lab 04/28/19 0942  AST 93*  ALT 54*  ALKPHOS 108  BILITOT 0.9  PROT 7.0  ALBUMIN 3.7   No results for input(s): LIPASE, AMYLASE in the last 168 hours. Recent Labs  Lab 04/28/19 0942  AMMONIA 24   CBC: Recent Labs  Lab 04/28/19 0745 04/28/19 0942 04/29/19 0631  WBC 9.0 10.3 10.7*  NEUTROABS  --  8.0*  --   HGB 8.7* 9.0* 8.1*  HCT 28.2* 29.0* 27.0*  MCV 89.5 89.5 93.4  PLT 391 445* 406*   Cardiac Enzymes: No results for input(s): CKTOTAL, CKMB, CKMBINDEX, TROPONINI in the last 168 hours. CBG: Recent Labs  Lab 04/28/19 0926 04/28/19 2202 04/29/19 0534 04/29/19 0742 04/29/19 0811  GLUCAP 102* 89 99 66* 107*   Iron Studies: No results for input(s): IRON, TIBC, TRANSFERRIN, FERRITIN in the last 72 hours. Studies/Results: Ct Abdomen Pelvis Wo Contrast  Result Date: 04/28/2019 CLINICAL DATA:  58 year old female with abdominal pain, nausea and vomiting. Chronic kidney disease. EXAM: CT ABDOMEN AND PELVIS WITHOUT CONTRAST TECHNIQUE: Multidetector CT imaging of the abdomen and pelvis was performed following the standard protocol without IV contrast. COMPARISON:  11/09/2016 CT and prior studies FINDINGS: Please note that parenchymal abnormalities may be missed without intravenous contrast. Lower chest: A small to moderate RIGHT pleural effusion with RIGHT basilar atelectasis identified. Hepatobiliary: No definite hepatic or gallbladder abnormalities noted. No biliary dilatation. Pancreas: No acute abnormalities. Calcifications along the unscented process are unchanged. Spleen: Unremarkable Adrenals/Urinary Tract: No significant changes with 3 punctate nonobstructing LEFT renal calculi and mild perinephric stranding. No evidence of hydronephrosis or obstructing urinary calculi identified. The adrenal glands and bladder are unremarkable. Stomach/Bowel: Stomach is within normal limits. Appendix appears normal. No evidence of bowel wall thickening,  distention, or inflammatory changes. Vascular/Lymphatic: Aortic atherosclerosis. No enlarged abdominal or pelvic lymph nodes. Reproductive: Uterus and bilateral adnexa are unremarkable. Other: Small amount of ascites within the abdomen and pelvis identified. Diffuse subcutaneous edema is present. No focal collection or pneumoperitoneum. Musculoskeletal: No acute or suspicious bony abnormalities. IMPRESSION: 1. Small amount of ascites, diffuse subcutaneous edema and small to moderate RIGHT pleural effusion. 2. Nonobstructing LEFT renal calculi 3. Aortic Atherosclerosis (ICD10-I70.0). Electronically Signed   By: Margarette Canada M.D.   On: 04/28/2019 14:12   Ct Head Wo Contrast  Result Date: 04/28/2019 CLINICAL DATA:  58 year old female with altered mental status. EXAM: CT HEAD WITHOUT CONTRAST TECHNIQUE: Contiguous axial images were obtained from the base of the skull through the vertex without intravenous contrast. COMPARISON:  03/10/2018 FINDINGS: Brain: No evidence of acute infarction, hemorrhage, hydrocephalus, extra-axial collection or mass lesion/mass effect. Chronic small-vessel white matter ischemic changes and remote bilateral basal ganglia infarcts again noted. Vascular: Carotid and vertebral atherosclerotic calcifications again noted. Skull: Normal. Negative for fracture or  focal lesion. Sinuses/Orbits: No acute finding. Other: None. IMPRESSION: 1. No evidence of acute intracranial abnormality. 2. Chronic small-vessel white matter ischemic changes and remote bilateral basal ganglia infarcts. Electronically Signed   By: Margarette Canada M.D.   On: 04/28/2019 14:03   Nm Pulmonary Perfusion  Result Date: 04/28/2019 CLINICAL DATA:  Elevated D-dimer.  Concern for pulmonary embolism. EXAM: NUCLEAR MEDICINE PERFUSION LUNG SCAN TECHNIQUE: Perfusion images were obtained in multiple projections after intravenous injection of radiopharmaceutical. RADIOPHARMACEUTICALS:  1.45 mCi Tc-61m MAA COMPARISON:  Chest radiograph  FINDINGS: No wedge-shaped peripheral perfusion defects to suggest acute pulmonary embolism. IMPRESSION: No evidence of acute pulmonary embolism. Electronically Signed   By: Suzy Bouchard M.D.   On: 04/28/2019 16:16   Dg Chest Portable 1 View  Result Date: 04/28/2019 CLINICAL DATA:  Altered mental status and lethargy. Shortness of breath. EXAM: PORTABLE CHEST 1 VIEW COMPARISON:  June 10, 2018 FINDINGS: There is a small right pleural effusion with right base atelectasis. There is also mild atelectasis in the left mid lung. The lungs elsewhere are clear. There is cardiomegaly with pulmonary vascularity normal. No adenopathy. No bone lesions. IMPRESSION: Small right pleural effusion with right base atelectasis. There is left midlung atelectasis. There is no frank edema or consolidation. Heart mildly enlarged, stable. Pulmonary vascularity within normal limits. No evident adenopathy. Electronically Signed   By: Lowella Grip III M.D.   On: 04/28/2019 10:19   Dg Abd Portable 2 Views  Result Date: 04/28/2019 CLINICAL DATA:  Distended abdomen, abdominal pain. EXAM: PORTABLE ABDOMEN - 2 VIEW COMPARISON:  Chest x-ray of 11/08/2016, x-ray also of 06/10/2018 FINDINGS: Heart size is enlarged and there is a right-sided pleural effusion. Chest is incompletely imaged. No free air beneath either right or left hemidiaphragm on today's exam. Limited assessment due to patient body habitus. Bowel gas pattern without signs of obstruction. Paucity of gas noted in the central abdomen. No acute bone finding. IMPRESSION: 1. Right pleural effusion 2. No no signs of free air or bowel obstruction. Electronically Signed   By: Zetta Bills M.D.   On: 04/28/2019 10:23    Dialysis Orders:  [name] HD start,  CLIP to [facility] TTS 2nd shift   Assessment/Plan: 1. ESRD. Progressive CKD. HD indicated with uremic symptoms/volume excess. Plan for 1st HD today after catheter placement by IR.  HD again tomorrow.  2. Hypertension/volume   - BP low stable. On amlodipine 10, metoprolol 100. May need to titrate meds to allow UF. UF goal 2L Keep SBP >100. Monitor. Continue to probe for EDW.  3. Anemia  - Hgb 8.1. Check Fe studies. IV Fe bolus started.   4. Metabolic bone disease -  Ca ok. Check Phos. No binders/VDRA yet  5. Nutrition - Renal diet/ Fluid restriction 6. DM Type 2 - insulin per primary   Lynnda Child PA-C [place] Pager [phone] 04/29/2019, 12:17 PM    Seen and examined at bedside.  Chart reviewed.  I agree with above. 58 year old female with progressive CKD 5 to ESRD now with uremic symptoms.  Plan for first dialysis today after placement of tunneled catheter.  She had  s/p left 2nd stage BVT by Dr. Carlis Abbott on 04/11/19.  Patient was lying on bed, not in distress.  Reports weakness but denied chest pain or shortness of breath.  On examination  lower extreme edema +.  Lungs clear.  Lawson Radar, MD [place] [facility].

## 2019-04-29 NOTE — H&P (Signed)
Chief Complaint: ESRD needing hemodialysis  Referring Physician(s): Pearson Grippe  Supervising Physician: Aletta Edouard  Patient Status: [facility] - In-pt  History of Present Illness: [name] Michael is a 58 y.o. female with end stage renal disease who was initially scheduled for placement of a tunneled HD catheter yesterday, but was found to be lethargic with O2 sat of 80% so she was admitted.  She has a fistula placed but it is not mature and not ready for use.  She had a small breakfast around 7:30.   Past Medical History:  Diagnosis Date  . Ambulates with cane   . CKD (chronic kidney disease)   . Constipation   . Diabetes mellitus    type 2  . GERD (gastroesophageal reflux disease)   . Hyperlipidemia   . Hypertension   . Osteoporosis   . Stroke [facility]) 04-01-11   left frontal subcortical, saw Dr. [name]   . TIA (transient ischemic attack) 03-12-11  . Vitamin D deficiency     Past Surgical History:  Procedure Laterality Date  . AV FISTULA PLACEMENT Left 02/21/2019   Procedure: BRACHIOCEPHALIC ARTERIOVENOUS (AV) FISTULA CREATION;  Surgeon: [name], MD;  Location: [place];  Service: Vascular;  Laterality: Left;  . BASCILIC VEIN TRANSPOSITION Left 04/11/2019   Procedure: SECOND STAGE BASILIC VEIN TRANSPOSITION LEFT ARM;  Surgeon: [name], MD;  Location: [place];  Service: Vascular;  Laterality: Left;  . OPEN REDUCTION INTERNAL FIXATION (ORIF) DISTAL RADIAL FRACTURE Left 01/28/2016   Procedure: OPEN REDUCTION INTERNAL FIXATION (ORIF) DISTAL RADIAL FRACTURE;  Surgeon: Iran Planas, MD;  Location: [place];  Service: Orthopedics;  Laterality: Left;  . [place]     age 63    Allergies: Hydrocodone  Medications: Prior to Admission medications   Medication Sig Start Date End Date Taking? Authorizing Provider  acetaminophen (TYLENOL) 325 MG tablet Take 650 mg by mouth every 4 (four) hours  as needed for mild pain.   Yes [provider]  amLODipine (NORVASC) 10 MG tablet Take 10 mg by mouth daily.   Yes [provider]  aspirin 81 MG chewable tablet Chew 81 mg by mouth daily.   Yes [provider]  atorvastatin (LIPITOR) 40 MG tablet Take 40 mg by mouth at bedtime.    Yes [provider]  diphenhydrAMINE (BANOPHEN) 25 mg capsule Take 25 mg by mouth every 4 (four) hours as needed for itching or allergies.   Yes [provider]  famotidine (PEPCID) 10 MG tablet Take 10 mg by mouth daily.   Yes [provider]  FLUoxetine (PROZAC) 10 MG capsule Take 10 mg by mouth daily.   Yes [provider]  gabapentin (NEURONTIN) 100 MG capsule Take 100 mg by mouth daily.    Yes [provider]  insulin lispro (HUMALOG) 100 UNIT/ML injection Inject 8 Units into the skin See admin instructions. Inject 8 units into the skin three times a day with meals and hold if BGL <150 or if patient is not eating   Yes [provider]  metoprolol tartrate (LOPRESSOR) 100 MG tablet Take 100 mg by mouth 2 (two) times daily.   Yes [provider]  Multiple Vitamins-Minerals (CERTAVITE SENIOR/ANTIOXIDANT) TABS Take 1 tablet by mouth daily.   Yes [provider]  omeprazole (PRILOSEC) 40 MG capsule Take 40 mg by mouth daily.   Yes [provider]  polyethylene glycol  powder (GAVILAX) 17 GM/SCOOP powder Take 17 g by mouth See admin instructions. Mix 17 grams of powder into 8 ounces of fluid, stir, and drink (by mouth) once a day   Yes [provider]  SACCHAROMYCES BOULARDII PO Take 1 capsule by mouth 2 (two) times daily.   Yes [provider]  senna-docusate (SENOKOT-S) 8.6-50 MG tablet Take 1 tablet by mouth at bedtime as needed for mild constipation. 06/11/18  Yes Regalado, [name], MD  sodium bicarbonate 650 MG tablet Take 1,300 mg by mouth 2 (two) times daily.   Yes [provider]   Vitamin D, Ergocalciferol, (DRISDOL) 50000 units CAPS capsule Take 1 capsule (50,000 Units total) by mouth every 7 (seven) days. Patient taking differently: Take 50,000 Units by mouth every Wednesday.  11/21/16  Yes Ghimire, Henreitta Leber, MD  oxyCODONE (ROXICODONE) 5 MG immediate release tablet Take 1 tablet (5 mg total) by mouth every 8 (eight) hours as needed. Patient not taking: Reported on 04/28/2019 04/11/19   Gabriel Earing, PA-C     Family History  Problem Relation Age of Onset  . Stroke Mother   . Diabetes Mother   . Kidney failure Mother   . Heart failure Mother   . Stroke Father   . Cancer Sister        Breast- 76's    Social History   Socioeconomic History  . Marital status: Single    Spouse name: Not on file  . Number of children: Not on file  . Years of education: Not on file  . Highest education level: Not on file  Occupational History  . Not on file  Social Needs  . Financial resource strain: Not on file  . Food insecurity    Worry: Not on file    Inability: Not on file  . Transportation needs    Medical: Not on file    Non-medical: Not on file  Tobacco Use  . Smoking status: Current Every Day Smoker    Packs/day: 1.00    Years: 32.00    Pack years: 32.00    Types: Cigarettes    Last attempt to quit: 09/23/2012    Years since quitting: 6.6  . Smokeless tobacco: Never Used  . Tobacco comment: or less  Substance and Sexual Activity  . Alcohol use: No  . Drug use: No  . Sexual activity: Never    Birth control/protection: Post-menopausal  Lifestyle  . Physical activity    Days per week: Not on file    Minutes per session: Not on file  . Stress: Not on file  Relationships  . Social Herbalist on phone: Not on file    Gets together: Not on file    Attends religious service: Not on file    Active member of club or organization: Not on file    Attends meetings of clubs or organizations: Not on file    Relationship status: Not on file  Other  Topics Concern  . Not on file  Social History Narrative   Lives at [facility], Assisted living. Does not work.        Review of Systems: A 12 point ROS discussed and pertinent positives are indicated in the HPI above.  All other systems are negative.  Review of Systems  Vital Signs: BP (!) 108/38 (BP Location: Right Arm)   Pulse 63   Temp (!) 97.4 F (36.3 C) (Oral)   Resp 16   Ht 4\' 6"  (  1.372 m)   Wt 51.6 kg   LMP 03/06/2013 (LMP Unknown)   SpO2 98%   BMI 27.43 kg/m   Physical Exam Vitals signs reviewed.  Constitutional:      Appearance: Normal appearance.  HENT:     Head: Atraumatic.  Eyes:     Extraocular Movements: Extraocular movements intact.  Neck:     Musculoskeletal: Normal range of motion.  Cardiovascular:     Rate and Rhythm: Normal rate and regular rhythm.  Pulmonary:     Effort: Pulmonary effort is normal.     Breath sounds: Normal breath sounds.  Abdominal:     Palpations: Abdomen is soft.  Musculoskeletal: Normal range of motion.  Skin:    General: Skin is warm and dry.  Neurological:     General: No focal deficit present.     Mental Status: She is alert and oriented to person, place, and time.  Psychiatric:        Mood and Affect: Mood normal.        Behavior: Behavior normal.        Thought Content: Thought content normal.        Judgment: Judgment normal.     Imaging: Ct Abdomen Pelvis Wo Contrast  Result Date: 04/28/2019 CLINICAL DATA:  58 year old female with abdominal pain, nausea and vomiting. Chronic kidney disease. EXAM: CT ABDOMEN AND PELVIS WITHOUT CONTRAST TECHNIQUE: Multidetector CT imaging of the abdomen and pelvis was performed following the standard protocol without IV contrast. COMPARISON:  11/09/2016 CT and prior studies FINDINGS: Please note that parenchymal abnormalities may be missed without intravenous contrast. Lower chest: A small to moderate RIGHT pleural effusion with RIGHT basilar atelectasis identified.  Hepatobiliary: No definite hepatic or gallbladder abnormalities noted. No biliary dilatation. Pancreas: No acute abnormalities. Calcifications along the unscented process are unchanged. Spleen: Unremarkable Adrenals/Urinary Tract: No significant changes with 3 punctate nonobstructing LEFT renal calculi and mild perinephric stranding. No evidence of hydronephrosis or obstructing urinary calculi identified. The adrenal glands and bladder are unremarkable. Stomach/Bowel: Stomach is within normal limits. Appendix appears normal. No evidence of bowel wall thickening, distention, or inflammatory changes. Vascular/Lymphatic: Aortic atherosclerosis. No enlarged abdominal or pelvic lymph nodes. Reproductive: Uterus and bilateral adnexa are unremarkable. Other: Small amount of ascites within the abdomen and pelvis identified. Diffuse subcutaneous edema is present. No focal collection or pneumoperitoneum. Musculoskeletal: No acute or suspicious bony abnormalities. IMPRESSION: 1. Small amount of ascites, diffuse subcutaneous edema and small to moderate RIGHT pleural effusion. 2. Nonobstructing LEFT renal calculi 3. Aortic Atherosclerosis (ICD10-I70.0). Electronically Signed   By: Margarette Canada M.D.   On: 04/28/2019 14:12   Ct Head Wo Contrast  Result Date: 04/28/2019 CLINICAL DATA:  58 year old female with altered mental status. EXAM: CT HEAD WITHOUT CONTRAST TECHNIQUE: Contiguous axial images were obtained from the base of the skull through the vertex without intravenous contrast. COMPARISON:  03/10/2018 FINDINGS: Brain: No evidence of acute infarction, hemorrhage, hydrocephalus, extra-axial collection or mass lesion/mass effect. Chronic small-vessel white matter ischemic changes and remote bilateral basal ganglia infarcts again noted. Vascular: Carotid and vertebral atherosclerotic calcifications again noted. Skull: Normal. Negative for fracture or focal lesion. Sinuses/Orbits: No acute finding. Other: None. IMPRESSION: 1.  No evidence of acute intracranial abnormality. 2. Chronic small-vessel white matter ischemic changes and remote bilateral basal ganglia infarcts. Electronically Signed   By: Margarette Canada M.D.   On: 04/28/2019 14:03   Nm Pulmonary Perfusion  Result Date: 04/28/2019 CLINICAL DATA:  Elevated D-dimer.  Concern for pulmonary embolism.  EXAM: NUCLEAR MEDICINE PERFUSION LUNG SCAN TECHNIQUE: Perfusion images were obtained in multiple projections after intravenous injection of radiopharmaceutical. RADIOPHARMACEUTICALS:  1.45 mCi Tc-61m MAA COMPARISON:  Chest radiograph FINDINGS: No wedge-shaped peripheral perfusion defects to suggest acute pulmonary embolism. IMPRESSION: No evidence of acute pulmonary embolism. Electronically Signed   By: Suzy Bouchard M.D.   On: 04/28/2019 16:16   Dg Chest Portable 1 View  Result Date: 04/28/2019 CLINICAL DATA:  Altered mental status and lethargy. Shortness of breath. EXAM: PORTABLE CHEST 1 VIEW COMPARISON:  June 10, 2018 FINDINGS: There is a small right pleural effusion with right base atelectasis. There is also mild atelectasis in the left mid lung. The lungs elsewhere are clear. There is cardiomegaly with pulmonary vascularity normal. No adenopathy. No bone lesions. IMPRESSION: Small right pleural effusion with right base atelectasis. There is left midlung atelectasis. There is no frank edema or consolidation. Heart mildly enlarged, stable. Pulmonary vascularity within normal limits. No evident adenopathy. Electronically Signed   By: Lowella Grip III M.D.   On: 04/28/2019 10:19   Dg Abd Portable 2 Views  Result Date: 04/28/2019 CLINICAL DATA:  Distended abdomen, abdominal pain. EXAM: PORTABLE ABDOMEN - 2 VIEW COMPARISON:  Chest x-ray of 11/08/2016, x-ray also of 06/10/2018 FINDINGS: Heart size is enlarged and there is a right-sided pleural effusion. Chest is incompletely imaged. No free air beneath either right or left hemidiaphragm on today's exam. Limited  assessment due to patient body habitus. Bowel gas pattern without signs of obstruction. Paucity of gas noted in the central abdomen. No acute bone finding. IMPRESSION: 1. Right pleural effusion 2. No no signs of free air or bowel obstruction. Electronically Signed   By: Zetta Bills M.D.   On: 04/28/2019 10:23    Labs:  CBC: Recent Labs    10/12/18 1512  04/11/19 0846 04/28/19 0745 04/28/19 0942 04/29/19 0631  WBC 9.0  --   --  9.0 10.3 10.7*  HGB 9.0*   < > 8.2* 8.7* 9.0* 8.1*  HCT 28.6*   < > 24.0* 28.2* 29.0* 27.0*  PLT 317  --   --  391 445* 406*   < > = values in this interval not displayed.    COAGS: Recent Labs    04/28/19 0745  INR 1.4*    BMP: Recent Labs    10/12/18 1435  04/11/19 0846 04/28/19 0745 04/28/19 0942 04/29/19 0631  NA 137   < > 142 136 136 141  K 5.1   < > 4.9 5.0 5.0 5.4*  CL 106  --  108 98 96* 104  CO2 20*  --   --  21* 21* 17*  GLUCOSE 77   < > 141* 123* 118* 102*  BUN 70*  --  54* 69* 67* 69*  CALCIUM 9.1  --   --  8.4* 8.6* 8.7*  CREATININE 4.50*  --  5.60* 5.40* 5.63* 5.62*  GFRNONAA 10*  --   --  8* 8* 8*  GFRAA 12*  --   --  9* 9* 9*   < > = values in this interval not displayed.    LIVER FUNCTION TESTS: Recent Labs    06/10/18 0652 04/28/19 0942  BILITOT 0.7 0.9  AST 16 93*  ALT 15 54*  ALKPHOS 71 108  PROT 7.1 7.0  ALBUMIN 3.6 3.7    TUMOR MARKERS: No results for input(s): AFPTM, CEA, CA199, CHROMGRNA in the last 8760 hours.  Assessment and Plan:  ESRD needing hemodialysis.  Will  proceed with placement of tunneled HD catheter today.  Risks and benefits discussed with the patient including, but not limited to bleeding, infection, vascular injury, pneumothorax which may require chest tube placement, air embolism or even death  All of the patient's questions were answered, patient is agreeable to proceed. Consent signed and in chart.  Thank you for this interesting consult.  I greatly enjoyed meeting KEYERIA SINHA and look forward to participating in their care.  A copy of this report was sent to the requesting provider on this date.  Electronically Signed: Murrell Redden, PA-C   04/29/2019, 9:38 AM      I spent a total of 20 Minutes  in face to face in clinical consultation, greater than 50% of which was counseling/coordinating care for tunneled HD catheter.

## 2019-04-30 LAB — CBC
HCT: 24.6 % — ABNORMAL LOW (ref 36.0–46.0)
Hemoglobin: 7.5 g/dL — ABNORMAL LOW (ref 12.0–15.0)
MCH: 28.1 pg (ref 26.0–34.0)
MCHC: 30.5 g/dL (ref 30.0–36.0)
MCV: 92.1 fL (ref 80.0–100.0)
Platelets: 333 10*3/uL (ref 150–400)
RBC: 2.67 MIL/uL — ABNORMAL LOW (ref 3.87–5.11)
RDW: 15.7 % — ABNORMAL HIGH (ref 11.5–15.5)
WBC: 10.8 10*3/uL — ABNORMAL HIGH (ref 4.0–10.5)
nRBC: 0.4 % — ABNORMAL HIGH (ref 0.0–0.2)

## 2019-04-30 LAB — GLUCOSE, CAPILLARY
Glucose-Capillary: 134 mg/dL — ABNORMAL HIGH (ref 70–99)
Glucose-Capillary: 165 mg/dL — ABNORMAL HIGH (ref 70–99)
Glucose-Capillary: 158 mg/dL — ABNORMAL HIGH (ref 70–99)
Glucose-Capillary: 164 mg/dL — ABNORMAL HIGH (ref 70–99)

## 2019-04-30 LAB — HEPATITIS B CORE ANTIBODY, TOTAL: Hep B Core Total Ab: NONREACTIVE

## 2019-04-30 LAB — URINE CULTURE

## 2019-04-30 LAB — FERRITIN: Ferritin: 1086 ng/mL — ABNORMAL HIGH (ref 11–307)

## 2019-04-30 LAB — IRON AND TIBC
Iron: 103 ug/dL (ref 28–170)
Saturation Ratios: 41 % — ABNORMAL HIGH (ref 10.4–31.8)
TIBC: 253 ug/dL (ref 250–450)
UIBC: 150 ug/dL

## 2019-04-30 LAB — HIV-1 RNA QUANT-NO REFLEX-BLD
HIV 1 RNA Quant: <20 copies/mL
LOG10 HIV-1 RNA: UNDETERMINED log10copy/mL

## 2019-04-30 LAB — HEPATITIS B SURFACE ANTIBODY,QUALITATIVE: Hep B S Ab: NONREACTIVE

## 2019-04-30 LAB — HEPATITIS B SURFACE ANTIGEN: Hepatitis B Surface Ag: NONREACTIVE

## 2019-04-30 MED ORDER — HEPARIN SODIUM (PORCINE) 1000 UNIT/ML DIALYSIS
1000.0000 [IU] | INTRAMUSCULAR | Status: DC | PRN
  Administered 2019-05-01: 03:00:00 3200 [IU] via INTRAVENOUS_CENTRAL

## 2019-04-30 MED ORDER — ALTEPLASE 2 MG IJ SOLR: 2.0000 mg | Freq: Once | INTRAMUSCULAR | Status: DC | PRN

## 2019-04-30 MED ORDER — SODIUM CHLORIDE 0.9 % IV SOLN: 100.0000 mL | INTRAVENOUS | Status: DC | PRN

## 2019-04-30 MED ORDER — PENTAFLUOROPROP-TETRAFLUOROETH EX AERO: 1.0000 "application " | INHALATION_SPRAY | CUTANEOUS | Status: DC | PRN

## 2019-04-30 MED ORDER — DARBEPOETIN ALFA 40 MCG/0.4ML IJ SOSY
40.0000 ug | PREFILLED_SYRINGE | INTRAMUSCULAR | Status: DC
  Administered 2019-05-01: 03:00:00 40 ug via INTRAVENOUS
  Filled 2019-04-30: qty 0.4

## 2019-04-30 MED ORDER — HEPARIN SODIUM (PORCINE) 1000 UNIT/ML IJ SOLN
3.2000 mL | Freq: Once | INTRAMUSCULAR | Status: AC
  Administered 2019-04-30: 3200 [IU] via INTRAVENOUS

## 2019-04-30 MED ORDER — LIDOCAINE HCL (PF) 1 % IJ SOLN: 5.0000 mL | INTRAMUSCULAR | Status: DC | PRN

## 2019-04-30 MED ORDER — HEPARIN SODIUM (PORCINE) 1000 UNIT/ML IJ SOLN
INTRAMUSCULAR | Status: AC
  Filled 2019-04-30: qty 4

## 2019-04-30 MED ORDER — LIDOCAINE-PRILOCAINE 2.5-2.5 % EX CREA: 1.0000 "application " | TOPICAL_CREAM | CUTANEOUS | Status: DC | PRN

## 2019-04-30 NOTE — Progress Notes (Addendum)
[facility] Progress Note   Subjective:  Tolerated 1st HD last night without complaints. Feels better today, eating lunch. Denies CP, SOB, N,V, D.  Objective Vitals:   04/30/19 0230 04/30/19 0300 04/30/19 0500 04/30/19 0851  BP: 118/72 136/69 138/86 140/61  Pulse: 62 67 95 68  Resp: 15 14 15 18   Temp:  98.5 F (36.9 C) 98.6 F (37 C) (!) 97.4 F (36.3 C)  TempSrc:  Oral Oral Oral  SpO2: 95% 95% 96% 96%  Weight:  49.6 kg    Height:        Physical Exam General: Well appearing female NAD Heart: RRR Lungs: clear bilaterally  Abdomen: soft NTND Extremities: No sig LE edema Dialysis Access: R IJ TDC ;  [name] BVT +bruit    Weight change: 4.88 kg   Additional Objective Labs: Basic Metabolic Panel: Recent Labs  Lab 04/28/19 0942 04/29/19 0631 04/30/19 0412  NA 136 141 137  K 5.0 5.4* 4.1  CL 96* 104 102  CO2 21* 17* 22  GLUCOSE 118* 102* 110*  BUN 67* 69* 38*  CREATININE 5.63* 5.62* 3.45*  CALCIUM 8.6* 8.7* 8.3*  PHOS  --   --  4.3   CBC: Recent Labs  Lab 04/28/19 0745 04/28/19 0942 04/29/19 0631 04/30/19 0412  WBC 9.0 10.3 10.7* 10.8*  NEUTROABS  --  8.0*  --   --   HGB 8.7* 9.0* 8.1* 7.5*  HCT 28.2* 29.0* 27.0* 24.6*  MCV 89.5 89.5 93.4 92.1  PLT 391 445* 406* 333   Blood Culture    Component Value Date/Time   SDES BLOOD RIGHT ARM 04/28/2019 2144   SPECREQUEST  04/28/2019 2144    BOTTLES DRAWN AEROBIC ONLY Blood Culture results may not be optimal due to an inadequate volume of blood received in culture bottles   CULT  04/28/2019 2144    NO GROWTH 2 DAYS Performed at [facility], [address] [place], [place] [address]    REPTSTATUS PENDING 04/28/2019 2144     Medications: . sodium chloride    . ferric gluconate (FERRLECIT/NULECIT) IV 250 mg (04/29/19 1032)   . amLODipine  10 mg Oral Daily  . aspirin EC  81 mg Oral Daily  . atorvastatin  40 mg Oral QPM  . Chlorhexidine Gluconate Cloth  6 each Topical Q0600  .  famotidine  20 mg Oral Daily  . FLUoxetine  10 mg Oral Daily  . gabapentin  100 mg Oral BID  . heparin      . heparin  5,000 Units Subcutaneous Q8H  . insulin aspart  0-5 Units Subcutaneous QHS  . insulin aspart  0-9 Units Subcutaneous TID WC  . mouth rinse  15 mL Mouth Rinse BID  . metoprolol tartrate  100 mg Oral BID  . mupirocin ointment  1 application Nasal BID  . pantoprazole  40 mg Oral Daily  . polyethylene glycol  17 g Oral Daily  . sodium bicarbonate  1,300 mg Oral BID  . sodium chloride flush  3 mL Intravenous Q12H    Dialysis Orders:  New HD start,  CLIP to [facility] TTS 2nd shift   Assessment/Plan: 1. ESRD. Progressive CKD V. HD indicated with uremic symptoms/volume excess.  s/p left 2nd stage BVT by Dr. Carlis Abbott on 04/11/19. R IJ TDC placed 11/6 by IR. 1st HD 11/6. Plan for 2nd HD today.   2.  Hypertension/volume  - BP stable. On amlodipine 10, metoprolol 100.  Tolerated 2L UF on 11/6.  Keep SBP >100. Monitor. Continue to probe for EDW.  3. Anemia  - Hgb 8.1>7.5. Tsat 41%. IV Fe ordered.  Will start ESA with HD [id number].  4. Metabolic bone disease -  Ca/Phos ok. No binders/VDRA yet  5. Nutrition - Renal diet/ Fluid restriction 6. DM Type 2 - insulin per primary    Lynnda Child PA-C [place] Pager [phone] 04/30/2019,11:52 AM  LOS: 2 days   Nephrology attending: Seen and examined at bedside.  Chart reviewed.  I agree with above. 58 year old female with progressive CKD 5 to ESRD now with uremic symptoms.  Status post first HD yesterday after placement of right IJ catheter by IR, tolerated well.  Receiving second treatment today.  She had s/p left 2nd stage BVT by Dr. Carlis Abbott on 04/11/19.  Patient was lying on bed, not in distress.  Reports weakness but denied chest pain or shortness of breath.  On examination  lower extreme edema +.  Lungs clear.  Patient has OP HD treatment arranged at [facility] clinic on a TTS schedule with a seat time of 12:30pm. Okay  to discharge from renal perspective.  I will discussed with the primary team.  Lawson Radar, MD [facility].

## 2019-04-30 NOTE — Progress Notes (Signed)
PROGRESS NOTE    [name] Michael  [id number] DOB: [birth year] DOA: 04/28/2019 PCP: Charlott Rakes, MD     Brief Narrative:  [name] Michael is a 58 y.o. female with past medical history of end-stage renal disease in preparation for dialysis who came to outpatient surgery for scheduled tunneled hemodialysis catheter.  Patient has new left AV fistula placed on 8/31-second stage on 10/19 by Dr. Carlis Abbott. Patient was noted to be lethargic with saturation of 80% on room air.  She reports being sick for the last 2 to 3 days with generalized weakness, fatigue and cough.  Reports diffuse abdominal pain but denies any chest pains or shortness of breath.  The patient was started on oxygen 3 L nasal cannula with saturation going up to 94%.  She denies being in contact with anybody with COVID-19 and had a negative Covid test earlier this month. She was referred to the ED.   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  ID has ordered for HIV RNA   DVT prophylaxis: SCD Code Status: Full code Family Communication: None at bedside Disposition Plan: Pending clinical improvement on HD    Consultants:   Nephrology  IR  Antimicrobials:  Anti-infectives (From admission, onward)   Start     Dose/Rate Route Frequency Ordered Stop   04/29/19 1601  ceFAZolin (ANCEF) IVPB  1 g/50 mL premix     over 30 Minutes Intravenous Continuous PRN 04/29/19 1601 04/29/19 1601   04/29/19 1545  ceFAZolin (ANCEF) 2-4 GM/100ML-% IVPB    Note to Pharmacy: Gar Ponto   : cabinet override      04/29/19 1545 04/30/19 0359       Objective: Vitals:   04/30/19 0230 04/30/19 0300 04/30/19 0500 04/30/19 0851  BP: 118/72 136/69 138/86 140/61  Pulse: 62 67 95 68  Resp: 15 14 15 18   Temp:  98.5 F (36.9 C) 98.6 F (37 C) (!) 97.4 F (36.3 C)  TempSrc:  Oral Oral Oral  SpO2: 95% 95% 96% 96%  Weight:  49.6 kg    Height:        Intake/Output Summary (Last 24 hours) at 04/30/2019 1141 Last data filed at 04/30/2019 0847 Gross per 24 hour  Intake 220 ml  Output 2000 ml  Net -1780 ml   Filed Weights   04/28/19 2041 04/30/19 0020 04/30/19 0300  Weight: 51.6 kg 51.6 kg 49.6 kg    Examination: General exam: Appears calm and comfortable  Respiratory system: Clear to auscultation. Respiratory effort normal.  On 2 L nasal cannula O2 Cardiovascular system: S1 & S2 heard, RRR. No pedal edema. Gastrointestinal system: Abdomen is nondistended, soft and nontender. Normal bowel sounds heard. Central nervous system: Alert and oriented. Non focal exam. Speech clear  Extremities: Symmetric in appearance bilaterally  Skin: No rashes, lesions or ulcers on exposed skin  Psychiatry: Judgement and insight appear  stable. Mood & affect appropriate.    Data Reviewed: I have personally reviewed following labs and imaging studies  CBC: Recent Labs  Lab 04/28/19 0745 04/28/19 0942 04/29/19 0631 04/30/19 0412  WBC 9.0 10.3 10.7* 10.8*  NEUTROABS  --  8.0*  --   --   HGB 8.7* 9.0* 8.1* 7.5*  HCT 28.2* 29.0* 27.0* 24.6*  MCV 89.5 89.5 93.4 92.1  PLT 391 445* 406* 0000000   Basic Metabolic Panel: Recent Labs  Lab 04/28/19 0745 04/28/19 0942 04/29/19 0631 04/30/19 0412  NA 136 136 141 137  K 5.0 5.0 5.4* 4.1  CL 98 96* 104 102  CO2 21* 21* 17* 22  GLUCOSE 123* 118* 102* 110*    BUN 69* 67* 69* 38*  CREATININE 5.40* 5.63* 5.62* 3.45*  CALCIUM 8.4* 8.6* 8.7* 8.3*  PHOS  --   --   --  4.3   GFR: Estimated Creatinine Clearance: 10.9 mL/min (A) (by C-G formula based on SCr of 3.45 mg/dL (H)). Liver Function Tests: Recent Labs  Lab 04/28/19 0942 04/30/19 0412  AST 93*  --   ALT 54*  --   ALKPHOS 108  --   BILITOT 0.9  --   PROT 7.0  --   ALBUMIN 3.7 3.2*   No results for input(s): LIPASE, AMYLASE in the last 168 hours. Recent Labs  Lab 04/28/19 0942  AMMONIA 24   Coagulation Profile: Recent Labs  Lab 04/28/19 0745  INR 1.4*   Cardiac Enzymes: No results for input(s): CKTOTAL, CKMB, CKMBINDEX, TROPONINI in the last 168 hours. BNP (last 3 results) No results for input(s): PROBNP in the last 8760 hours. HbA1C: Recent Labs    04/28/19 2144  HGBA1C 6.5*   CBG: Recent Labs  Lab 04/29/19 0742 04/29/19 0811 04/29/19 1131 04/30/19 0525 04/30/19 1101  GLUCAP 66* 107* 170* 134* 165*   Lipid Profile: No results for input(s): CHOL, HDL, LDLCALC, TRIG, CHOLHDL, LDLDIRECT in the last 72 hours. Thyroid Function Tests: Recent Labs    04/28/19 0942  TSH 9.747*   Anemia Panel: Recent Labs    04/30/19 0157  FERRITIN 1,086*  TIBC 253  IRON 103   Sepsis Labs: Recent Labs  Lab 04/28/19 0942 04/28/19 1200  LATICACIDVEN 1.5 1.1    Recent Results (from the past 240 hour(s))  Blood culture (routine x 2)     Status: None (Preliminary result)   Collection Time: 04/28/19 10:17 AM   Specimen: BLOOD  Result Value Ref Range Status   Specimen Description BLOOD RIGHT ANTECUBITAL  Final   Special Requests   Final    BOTTLES DRAWN AEROBIC AND ANAEROBIC Blood Culture adequate volume   Culture   Final    NO GROWTH 2 DAYS Performed at [facility], [place] [address] [place], [place] [address]    Report Status PENDING  Incomplete  SARS CORONAVIRUS 2 (TAT 6-24 HRS) Nasopharyngeal Nasopharyngeal Swab     Status: None   Collection Time: 04/28/19  10:51 AM   Specimen: Nasopharyngeal Swab  Result Value Ref Range Status   SARS Coronavirus 2 NEGATIVE NEGATIVE Final    Comment: (NOTE) SARS-CoV-2 target nucleic acids are NOT DETECTED. The SARS-CoV-2 RNA is generally detectable in upper and lower respiratory specimens during the acute phase of infection. Negative results do not preclude SARS-CoV-2 infection, do not rule out co-infections with other pathogens, and should not be used as the sole basis for treatment or other patient management decisions. Negative results must be combined with  clinical observations, patient history, and epidemiological information. The expected result is Negative. Fact Sheet for Patients: SugarRoll.be Fact Sheet for Healthcare Providers: https://www.woods-mathews.com/ This test is not yet approved or cleared by the [place] FDA and  has been authorized for detection and/or diagnosis of SARS-CoV-2 by FDA under an Emergency Use Authorization (EUA). This EUA will remain  in effect (meaning this test can be used) for the duration of the COVID-19 declaration under Section 56 4(b)(1) of the Act, 21 U.S.C. section 360bbb-3(b)(1), unless the authorization is terminated or revoked sooner. Performed at [facility], [place] [address] [place], [place] [address]   MRSA PCR Screening     Status: Abnormal   Collection Time: 04/28/19  9:02 PM   Specimen: Nasal Mucosa; Nasopharyngeal  Result Value Ref Range Status   MRSA by PCR POSITIVE (A) NEGATIVE Final    Comment:        The GeneXpert MRSA Assay (FDA approved for NASAL specimens only), is one component of a comprehensive MRSA colonization surveillance program. It is not intended to diagnose MRSA infection nor to guide or monitor treatment for MRSA infections. RESULT CALLED TO, READ BACK BY AND VERIFIED WITH: Mart Piggs [id number] 04/29/2019 Mena Goes Performed at [facility], [place] [address],  [place], [place] [address]   Blood culture (routine x 2)     Status: None (Preliminary result)   Collection Time: 04/28/19  9:44 PM   Specimen: BLOOD RIGHT ARM  Result Value Ref Range Status   Specimen Description BLOOD RIGHT ARM  Final   Special Requests   Final    BOTTLES DRAWN AEROBIC ONLY Blood Culture results may not be optimal due to an inadequate volume of blood received in culture bottles   Culture   Final    NO GROWTH 2 DAYS Performed at [facility], [place] [address], [place], [place] [address]    Report Status PENDING  Incomplete      Radiology Studies: Ct Abdomen Pelvis Wo Contrast  Result Date: 04/28/2019 CLINICAL DATA:  58 year old female with abdominal pain, nausea and vomiting. Chronic kidney disease. EXAM: CT ABDOMEN AND PELVIS WITHOUT CONTRAST TECHNIQUE: Multidetector CT imaging of the abdomen and pelvis was performed following the standard protocol without IV contrast. COMPARISON:  11/09/2016 CT and prior studies FINDINGS: Please note that parenchymal abnormalities may be missed without intravenous contrast. Lower chest: A small to moderate RIGHT pleural effusion with RIGHT basilar atelectasis identified. Hepatobiliary: No definite hepatic or gallbladder abnormalities noted. No biliary dilatation. Pancreas: No acute abnormalities. Calcifications along the unscented process are unchanged. Spleen: Unremarkable Adrenals/Urinary Tract: No significant changes with 3 punctate nonobstructing LEFT renal calculi and mild perinephric stranding. No evidence of hydronephrosis or obstructing urinary calculi identified. The adrenal glands and bladder are unremarkable. Stomach/Bowel: Stomach is within normal limits. Appendix appears normal. No evidence of bowel wall thickening, distention, or inflammatory changes. Vascular/Lymphatic: Aortic atherosclerosis. No enlarged abdominal or pelvic lymph nodes. Reproductive: Uterus and bilateral adnexa are unremarkable. Other: Small amount of ascites  within the abdomen and pelvis identified. Diffuse subcutaneous edema is present. No focal collection or pneumoperitoneum. Musculoskeletal: No acute or suspicious bony abnormalities. IMPRESSION: 1. Small amount of ascites, diffuse subcutaneous edema and small to moderate RIGHT pleural effusion. 2. Nonobstructing LEFT renal calculi 3. Aortic Atherosclerosis (ICD10-I70.0). Electronically Signed   By: Margarette Canada M.D.   On: 04/28/2019 14:12   Ct Head Wo Contrast  Result Date: 04/28/2019 CLINICAL DATA:  58 year old female with altered mental  status. EXAM: CT HEAD WITHOUT CONTRAST TECHNIQUE: Contiguous axial images were obtained from the base of the skull through the vertex without intravenous contrast. COMPARISON:  03/10/2018 FINDINGS: Brain: No evidence of acute infarction, hemorrhage, hydrocephalus, extra-axial collection or mass lesion/mass effect. Chronic small-vessel white matter ischemic changes and remote bilateral basal ganglia infarcts again noted. Vascular: Carotid and vertebral atherosclerotic calcifications again noted. Skull: Normal. Negative for fracture or focal lesion. Sinuses/Orbits: No acute finding. Other: None. IMPRESSION: 1. No evidence of acute intracranial abnormality. 2. Chronic small-vessel white matter ischemic changes and remote bilateral basal ganglia infarcts. Electronically Signed   By: Margarette Canada M.D.   On: 04/28/2019 14:03   Nm Pulmonary Perfusion  Result Date: 04/28/2019 CLINICAL DATA:  Elevated D-dimer.  Concern for pulmonary embolism. EXAM: NUCLEAR MEDICINE PERFUSION LUNG SCAN TECHNIQUE: Perfusion images were obtained in multiple projections after intravenous injection of radiopharmaceutical. RADIOPHARMACEUTICALS:  1.45 mCi Tc-35m MAA COMPARISON:  Chest radiograph FINDINGS: No wedge-shaped peripheral perfusion defects to suggest acute pulmonary embolism. IMPRESSION: No evidence of acute pulmonary embolism. Electronically Signed   By: Suzy Bouchard M.D.   On: 04/28/2019 16:16    Ir Fluoro Guide Cv Line Right  Result Date: 04/29/2019 CLINICAL DATA:  Renal failure and need for tunneled hemodialysis catheter. EXAM: TUNNELED CENTRAL VENOUS HEMODIALYSIS CATHETER PLACEMENT WITH ULTRASOUND AND FLUOROSCOPIC GUIDANCE ANESTHESIA/SEDATION: No conscious sedation was administered. MEDICATIONS: 2 g IV Ancef. FLUOROSCOPY TIME:  48 seconds.  2.0 mGy. PROCEDURE: The procedure, risks, benefits, and alternatives were explained to the patient. Questions regarding the procedure were encouraged and answered. The patient understands and consents to the procedure. A timeout was performed prior to initiating the procedure. The right neck and chest were prepped with chlorhexidine in a sterile fashion, and a sterile drape was applied covering the operative field. Maximum barrier sterile technique with sterile gowns and gloves were used for the procedure. Local anesthesia was provided with 1% lidocaine. Ultrasound was used to confirm patency of the right internal jugular vein. After creating a small venotomy incision, a 21 gauge needle was advanced into the right internal jugular vein under direct, real-time ultrasound guidance. Ultrasound image documentation was performed. After securing guidewire access, an 8 Fr dilator was placed. A J-wire was kinked to measure appropriate catheter length. A Palindrome tunneled hemodialysis catheter measuring 19 cm from tip to cuff was chosen for placement. This was tunneled in a retrograde fashion from the chest wall to the venotomy incision. At the venotomy, serial dilatation was performed and a 16 Fr peel-away sheath was placed over a guidewire. The catheter was then placed through the sheath and the sheath removed. Final catheter positioning was confirmed and documented with a fluoroscopic spot image. The catheter was aspirated, flushed with saline, and injected with appropriate volume heparin dwells. The venotomy incision was closed with subcutaneous 3-0 Monocryl and  subcuticular 4-0 Vicryl. Dermabond was applied to the incision. The catheter exit site was secured with 0-Prolene retention sutures. COMPLICATIONS: None.  No pneumothorax. FINDINGS: After catheter placement, the tip lies in the right atrium. The catheter aspirates normally and is ready for immediate use. IMPRESSION: Placement of tunneled hemodialysis catheter via the right internal jugular vein. The catheter tip lies in the right atrium. The catheter is ready for immediate use. Electronically Signed   By: Aletta Edouard M.D.   On: 04/29/2019 16:57   Ir US Guide Vasc Access Right  Result Date: 04/29/2019 CLINICAL DATA:  Renal failure and need for tunneled hemodialysis catheter. EXAM: TUNNELED CENTRAL VENOUS HEMODIALYSIS  CATHETER PLACEMENT WITH ULTRASOUND AND FLUOROSCOPIC GUIDANCE ANESTHESIA/SEDATION: No conscious sedation was administered. MEDICATIONS: 2 g IV Ancef. FLUOROSCOPY TIME:  48 seconds.  2.0 mGy. PROCEDURE: The procedure, risks, benefits, and alternatives were explained to the patient. Questions regarding the procedure were encouraged and answered. The patient understands and consents to the procedure. A timeout was performed prior to initiating the procedure. The right neck and chest were prepped with chlorhexidine in a sterile fashion, and a sterile drape was applied covering the operative field. Maximum barrier sterile technique with sterile gowns and gloves were used for the procedure. Local anesthesia was provided with 1% lidocaine. Ultrasound was used to confirm patency of the right internal jugular vein. After creating a small venotomy incision, a 21 gauge needle was advanced into the right internal jugular vein under direct, real-time ultrasound guidance. Ultrasound image documentation was performed. After securing guidewire access, an 8 Fr dilator was placed. A J-wire was kinked to measure appropriate catheter length. A Palindrome tunneled hemodialysis catheter measuring 19 cm from tip to cuff  was chosen for placement. This was tunneled in a retrograde fashion from the chest wall to the venotomy incision. At the venotomy, serial dilatation was performed and a 16 Fr peel-away sheath was placed over a guidewire. The catheter was then placed through the sheath and the sheath removed. Final catheter positioning was confirmed and documented with a fluoroscopic spot image. The catheter was aspirated, flushed with saline, and injected with appropriate volume heparin dwells. The venotomy incision was closed with subcutaneous 3-0 Monocryl and subcuticular 4-0 Vicryl. Dermabond was applied to the incision. The catheter exit site was secured with 0-Prolene retention sutures. COMPLICATIONS: None.  No pneumothorax. FINDINGS: After catheter placement, the tip lies in the right atrium. The catheter aspirates normally and is ready for immediate use. IMPRESSION: Placement of tunneled hemodialysis catheter via the right internal jugular vein. The catheter tip lies in the right atrium. The catheter is ready for immediate use. Electronically Signed   By: Aletta Edouard M.D.   On: 04/29/2019 16:57      Scheduled Meds:  amLODipine  10 mg Oral Daily   aspirin EC  81 mg Oral Daily   atorvastatin  40 mg Oral QPM   Chlorhexidine Gluconate Cloth  6 each Topical Q0600   famotidine  20 mg Oral Daily   FLUoxetine  10 mg Oral Daily   gabapentin  100 mg Oral BID   heparin       heparin  5,000 Units Subcutaneous Q8H   insulin aspart  0-5 Units Subcutaneous QHS   insulin aspart  0-9 Units Subcutaneous TID WC   mouth rinse  15 mL Mouth Rinse BID   metoprolol tartrate  100 mg Oral BID   mupirocin ointment  1 application Nasal BID   pantoprazole  40 mg Oral Daily   polyethylene glycol  17 g Oral Daily   sodium bicarbonate  1,300 mg Oral BID   sodium chloride flush  3 mL Intravenous Q12H   Continuous Infusions:  sodium chloride     ferric gluconate (FERRLECIT/NULECIT) IV 250 mg (04/29/19 1032)       LOS: 2 days      Time spent: 25 minutes   Dessa Phi, DO [facility] 04/30/2019, 11:41 AM   Available via Epic secure chat 7am-7pm After these hours, please refer to coverage provider listed on [email or web address]

## 2019-05-01 LAB — RENAL FUNCTION PANEL
Albumin: 3.2 g/dL — ABNORMAL LOW (ref 3.5–5.0)
Anion gap: 13 (ref 5–15)
BUN: 38 mg/dL — ABNORMAL HIGH (ref 6–20)
CO2: 22 mmol/L (ref 22–32)
Calcium: 8.3 mg/dL — ABNORMAL LOW (ref 8.9–10.3)
Chloride: 102 mmol/L (ref 98–111)
Creatinine, Ser: 3.45 mg/dL — ABNORMAL HIGH (ref 0.44–1.00)
GFR calc Af Amer: 16 mL/min — ABNORMAL LOW (ref >60)
GFR calc non Af Amer: 14 mL/min — ABNORMAL LOW (ref >60)
Glucose, Bld: 110 mg/dL — ABNORMAL HIGH (ref 70–99)
Phosphorus: 4.3 mg/dL (ref 2.5–4.6)
Potassium: 4.1 mmol/L (ref 3.5–5.1)
Sodium: 137 mmol/L (ref 135–145)

## 2019-05-01 LAB — CBC
HCT: 26.9 % — ABNORMAL LOW (ref 36.0–46.0)
Hemoglobin: 8 g/dL — ABNORMAL LOW (ref 12.0–15.0)
MCH: 27.9 pg (ref 26.0–34.0)
MCHC: 29.7 g/dL — ABNORMAL LOW (ref 30.0–36.0)
MCV: 93.7 fL (ref 80.0–100.0)
Platelets: 333 10*3/uL (ref 150–400)
RBC: 2.87 MIL/uL — ABNORMAL LOW (ref 3.87–5.11)
RDW: 16.2 % — ABNORMAL HIGH (ref 11.5–15.5)
WBC: 9.1 10*3/uL (ref 4.0–10.5)
nRBC: 0.7 % — ABNORMAL HIGH (ref 0.0–0.2)

## 2019-05-01 LAB — BASIC METABOLIC PANEL
Anion gap: 11 (ref 5–15)
BUN: 11 mg/dL (ref 6–20)
CO2: 27 mmol/L (ref 22–32)
Calcium: 8.3 mg/dL — ABNORMAL LOW (ref 8.9–10.3)
Chloride: 102 mmol/L (ref 98–111)
Creatinine, Ser: 1.69 mg/dL — ABNORMAL HIGH (ref 0.44–1.00)
GFR calc Af Amer: 38 mL/min — ABNORMAL LOW (ref >60)
GFR calc non Af Amer: 33 mL/min — ABNORMAL LOW (ref >60)
Glucose, Bld: 110 mg/dL — ABNORMAL HIGH (ref 70–99)
Potassium: 3.6 mmol/L (ref 3.5–5.1)
Sodium: 140 mmol/L (ref 135–145)

## 2019-05-01 LAB — GLUCOSE, CAPILLARY
Glucose-Capillary: 108 mg/dL — ABNORMAL HIGH (ref 70–99)
Glucose-Capillary: 160 mg/dL — ABNORMAL HIGH (ref 70–99)
Glucose-Capillary: 145 mg/dL — ABNORMAL HIGH (ref 70–99)
Glucose-Capillary: 142 mg/dL — ABNORMAL HIGH (ref 70–99)

## 2019-05-01 MED ORDER — HEPARIN SODIUM (PORCINE) 1000 UNIT/ML IJ SOLN
INTRAMUSCULAR | Status: AC
  Administered 2019-05-01: 3200 [IU] via INTRAVENOUS_CENTRAL
  Filled 2019-05-01: qty 4

## 2019-05-01 MED ORDER — DARBEPOETIN ALFA 40 MCG/0.4ML IJ SOSY
PREFILLED_SYRINGE | INTRAMUSCULAR | Status: AC
  Administered 2019-05-01: 40 ug via INTRAVENOUS
  Filled 2019-05-01: qty 0.4

## 2019-05-01 NOTE — Progress Notes (Signed)
PROGRESS NOTE    [name] Michael  [id number] DOB: [birth year] DOA: 04/28/2019 PCP: Charlott Rakes, MD     Brief Narrative:  [name] Michael is a 58 y.o. female with past medical history of end-stage renal disease in preparation for dialysis who came to outpatient surgery for scheduled tunneled hemodialysis catheter.  Patient has new left AV fistula placed on 8/31-second stage on 10/19 by Dr. Carlis Abbott. Patient was noted to be lethargic with saturation of 80% on room air.  She reports being sick for the last 2 to 3 days with generalized weakness, fatigue and cough.  Reports diffuse abdominal pain but denies any chest pains or shortness of breath.  The patient was started on oxygen 3 L nasal cannula with saturation going up to 94%.  She denies being in contact with anybody with COVID-19 and had a negative Covid test earlier this month. She was referred to the ED.   New events last 24 hours / Subjective: No issues overnight. Remains on 3L Max O2.   Assessment & Plan:   Active Problems:   AMS (altered mental status)   Hypoxemia   ESRD -Status post placement of AV fistula -TDC catheter placed 11/6  -Nephrology following for HD   Acute hypoxemic respiratory failure -Thought to be secondary to fluid overload from ESRD -Covid 19 negative  -Continue nasal cannula O2, 3L currently. Continue to wean as tolerated   Type 2 diabetes, well controlled -Ha1c 6.5  -SSI  Hyperlipidemia -Continue Lipitor  Hypertension -Continue Lopressor, amlodipine   Depression -Continue Prozac  HIV positive -HIV screening test obtained on this admission showed HIV positive.  ID has ordered for HIV RNA which resulted <20    DVT prophylaxis: SCD Code Status: Full code Family Communication: None at bedside Disposition Plan: OT recommended SNF placement. SW notified.    Consultants:   Nephrology  IR  Antimicrobials:  Anti-infectives (From admission, onward)   Start     Dose/Rate Route Frequency  Ordered Stop   04/29/19 1601  ceFAZolin (ANCEF) IVPB 1 g/50 mL premix     over 30 Minutes Intravenous Continuous PRN 04/29/19 1601 04/29/19 1601   04/29/19 1545  ceFAZolin (ANCEF) 2-4 GM/100ML-% IVPB    Note to Pharmacy: Gar Ponto   : cabinet override      04/29/19 1545 04/30/19 0359       Objective: Vitals:   05/01/19 0345 05/01/19 0356 05/01/19 0437 05/01/19 0844  BP: (!) 149/73 (!) 148/81 (!) 152/67 (!) 159/63  Pulse: 68 68 73 76  Resp:  12 14 17   Temp:  98.3 F (36.8 C) 98.9 F (37.2 C) 98.7 F (37.1 C)  TempSrc:  Oral Oral Oral  SpO2:  100% 99% 98%  Weight:  48.2 kg    Height:        Intake/Output Summary (Last 24 hours) at 05/01/2019 1223 Last data filed at 05/01/2019 0914 Gross per 24 hour  Intake 800 ml  Output 2000 ml  Net -1200 ml   Filed Weights   04/30/19 0300 05/01/19 0040 05/01/19 0356  Weight: 49.6 kg 50.5 kg 48.2 kg    Examination: General exam: Appears calm and comfortable  Respiratory system: Clear to auscultation anteriorly. Respiratory effort normal. On 3L [place] O2  Cardiovascular system: S1 & S2 heard, RRR. No pedal edema. Gastrointestinal system: Abdomen is nondistended, soft and nontender. Normal bowel sounds heard. Central nervous system: Alert and oriented. Non focal exam. Speech clear  Extremities: Symmetric in appearance bilaterally  Skin: No rashes,  lesions or ulcers on exposed skin  Psychiatry: Judgement and insight appear stable. Mood & affect appropriate.   Data Reviewed: I have personally reviewed following labs and imaging studies  CBC: Recent Labs  Lab 04/28/19 0745 04/28/19 0942 04/29/19 0631 04/30/19 0412 05/01/19 0528  WBC 9.0 10.3 10.7* 10.8* 9.1  NEUTROABS  --  8.0*  --   --   --   HGB 8.7* 9.0* 8.1* 7.5* 8.0*  HCT 28.2* 29.0* 27.0* 24.6* 26.9*  MCV 89.5 89.5 93.4 92.1 93.7  PLT 391 445* 406* 333 0000000   Basic Metabolic Panel: Recent Labs  Lab 04/28/19 0745 04/28/19 0942 04/29/19 0631 04/30/19 0412  05/01/19 0528  NA 136 136 141 137 140  K 5.0 5.0 5.4* 4.1 3.6  CL 98 96* 104 102 102  CO2 21* 21* 17* 22 27  GLUCOSE 123* 118* 102* 110* 110*  BUN 69* 67* 69* 38* 11  CREATININE 5.40* 5.63* 5.62* 3.45* 1.69*  CALCIUM 8.4* 8.6* 8.7* 8.3* 8.3*  PHOS  --   --   --  4.3  --    GFR: Estimated Creatinine Clearance: 21.9 mL/min (A) (by C-G formula based on SCr of 1.69 mg/dL (H)). Liver Function Tests: Recent Labs  Lab 04/28/19 0942 04/30/19 0412  AST 93*  --   ALT 54*  --   ALKPHOS 108  --   BILITOT 0.9  --   PROT 7.0  --   ALBUMIN 3.7 3.2*   No results for input(s): LIPASE, AMYLASE in the last 168 hours. Recent Labs  Lab 04/28/19 0942  AMMONIA 24   Coagulation Profile: Recent Labs  Lab 04/28/19 0745  INR 1.4*   Cardiac Enzymes: No results for input(s): CKTOTAL, CKMB, CKMBINDEX, TROPONINI in the last 168 hours. BNP (last 3 results) No results for input(s): PROBNP in the last 8760 hours. HbA1C: Recent Labs    04/28/19 2144  HGBA1C 6.5*   CBG: Recent Labs  Lab 04/30/19 1101 04/30/19 1600 04/30/19 2140 05/01/19 0607 05/01/19 1116  GLUCAP 165* 158* 164* 108* 160*   Lipid Profile: No results for input(s): CHOL, HDL, LDLCALC, TRIG, CHOLHDL, LDLDIRECT in the last 72 hours. Thyroid Function Tests: No results for input(s): TSH, T4TOTAL, FREET4, T3FREE, THYROIDAB in the last 72 hours. Anemia Panel: Recent Labs    04/30/19 0157  FERRITIN 1,086*  TIBC 253  IRON 103   Sepsis Labs: Recent Labs  Lab 04/28/19 0942 04/28/19 1200  LATICACIDVEN 1.5 1.1    Recent Results (from the past 240 hour(s))  Blood culture (routine x 2)     Status: None (Preliminary result)   Collection Time: 04/28/19 10:17 AM   Specimen: BLOOD  Result Value Ref Range Status   Specimen Description BLOOD RIGHT ANTECUBITAL  Final   Special Requests   Final    BOTTLES DRAWN AEROBIC AND ANAEROBIC Blood Culture adequate volume   Culture   Final    NO GROWTH 3 DAYS Performed at [facility], [place] [address] [place], [place] [address]    Report Status PENDING  Incomplete  SARS CORONAVIRUS 2 (TAT 6-24 HRS) Nasopharyngeal Nasopharyngeal Swab     Status: None   Collection Time: 04/28/19 10:51 AM   Specimen: Nasopharyngeal Swab  Result Value Ref Range Status   SARS Coronavirus 2 NEGATIVE NEGATIVE Final    Comment: (NOTE) SARS-CoV-2 target nucleic acids are NOT DETECTED. The SARS-CoV-2 RNA is generally detectable in upper and lower respiratory specimens during the acute phase of infection. Negative results  do not preclude SARS-CoV-2 infection, do not rule out co-infections with other pathogens, and should not be used as the sole basis for treatment or other patient management decisions. Negative results must be combined with clinical observations, patient history, and epidemiological information. The expected result is Negative. Fact Sheet for Patients: SugarRoll.be Fact Sheet for Healthcare Providers: https://www.woods-mathews.com/ This test is not yet approved or cleared by the [place] FDA and  has been authorized for detection and/or diagnosis of SARS-CoV-2 by FDA under an Emergency Use Authorization (EUA). This EUA will remain  in effect (meaning this test can be used) for the duration of the COVID-19 declaration under Section 56 4(b)(1) of the Act, 21 U.S.C. section 360bbb-3(b)(1), unless the authorization is terminated or revoked sooner. Performed at [facility], [place] [address] [place], [place] [address]   Urine culture     Status: Abnormal   Collection Time: 04/28/19  9:02 PM   Specimen: Urine, Clean Catch  Result Value Ref Range Status   Specimen Description URINE, CLEAN CATCH  Final   Special Requests   Final    NONE Performed at [facility], [place] [address], [place], [place] [address]    Culture MULTIPLE SPECIES PRESENT, SUGGEST RECOLLECTION (A)  Final   Report Status 04/30/2019 FINAL   Final  MRSA PCR Screening     Status: Abnormal   Collection Time: 04/28/19  9:02 PM   Specimen: Nasal Mucosa; Nasopharyngeal  Result Value Ref Range Status   MRSA by PCR POSITIVE (A) NEGATIVE Final    Comment:        The GeneXpert MRSA Assay (FDA approved for NASAL specimens only), is one component of a comprehensive MRSA colonization surveillance program. It is not intended to diagnose MRSA infection nor to guide or monitor treatment for MRSA infections. RESULT CALLED TO, READ BACK BY AND VERIFIED WITH: Mart Piggs [id number] 04/29/2019 Mena Goes Performed at [facility], [place] [address], [place], [place] [address]   Blood culture (routine x 2)     Status: None (Preliminary result)   Collection Time: 04/28/19  9:44 PM   Specimen: BLOOD RIGHT ARM  Result Value Ref Range Status   Specimen Description BLOOD RIGHT ARM  Final   Special Requests   Final    BOTTLES DRAWN AEROBIC ONLY Blood Culture results may not be optimal due to an inadequate volume of blood received in culture bottles   Culture   Final    NO GROWTH 3 DAYS Performed at [facility], [place] [address] [place],  [address]    Report Status PENDING  Incomplete      Radiology Studies: Ir Fluoro Guide Cv Line Right  Result Date: 04/29/2019 CLINICAL DATA:  Renal failure and need for tunneled hemodialysis catheter. EXAM: TUNNELED CENTRAL VENOUS HEMODIALYSIS CATHETER PLACEMENT WITH ULTRASOUND AND FLUOROSCOPIC GUIDANCE ANESTHESIA/SEDATION: No conscious sedation was administered. MEDICATIONS: 2 g IV Ancef. FLUOROSCOPY TIME:  48 seconds.  2.0 mGy. PROCEDURE: The procedure, risks, benefits, and alternatives were explained to the patient. Questions regarding the procedure were encouraged and answered. The patient understands and consents to the procedure. A timeout was performed prior to initiating the procedure. The right neck and chest were prepped with chlorhexidine in a sterile fashion, and a sterile drape was  applied covering the operative field. Maximum barrier sterile technique with sterile gowns and gloves were used for the procedure. Local anesthesia was provided with 1% lidocaine. Ultrasound was used to confirm patency of the  right internal jugular vein. After creating a small venotomy incision, a 21 gauge needle was advanced into the right internal jugular vein under direct, real-time ultrasound guidance. Ultrasound image documentation was performed. After securing guidewire access, an 8 Fr dilator was placed. A J-wire was kinked to measure appropriate catheter length. A Palindrome tunneled hemodialysis catheter measuring 19 cm from tip to cuff was chosen for placement. This was tunneled in a retrograde fashion from the chest wall to the venotomy incision. At the venotomy, serial dilatation was performed and a 16 Fr peel-away sheath was placed over a guidewire. The catheter was then placed through the sheath and the sheath removed. Final catheter positioning was confirmed and documented with a fluoroscopic spot image. The catheter was aspirated, flushed with saline, and injected with appropriate volume heparin dwells. The venotomy incision was closed with subcutaneous 3-0 Monocryl and subcuticular 4-0 Vicryl. Dermabond was applied to the incision. The catheter exit site was secured with 0-Prolene retention sutures. COMPLICATIONS: None.  No pneumothorax. FINDINGS: After catheter placement, the tip lies in the right atrium. The catheter aspirates normally and is ready for immediate use. IMPRESSION: Placement of tunneled hemodialysis catheter via the right internal jugular vein. The catheter tip lies in the right atrium. The catheter is ready for immediate use. Electronically Signed   By: Aletta Edouard M.D.   On: 04/29/2019 16:57   Ir US Guide Vasc Access Right  Result Date: 04/29/2019 CLINICAL DATA:  Renal failure and need for tunneled hemodialysis catheter. EXAM: TUNNELED CENTRAL VENOUS HEMODIALYSIS CATHETER  PLACEMENT WITH ULTRASOUND AND FLUOROSCOPIC GUIDANCE ANESTHESIA/SEDATION: No conscious sedation was administered. MEDICATIONS: 2 g IV Ancef. FLUOROSCOPY TIME:  48 seconds.  2.0 mGy. PROCEDURE: The procedure, risks, benefits, and alternatives were explained to the patient. Questions regarding the procedure were encouraged and answered. The patient understands and consents to the procedure. A timeout was performed prior to initiating the procedure. The right neck and chest were prepped with chlorhexidine in a sterile fashion, and a sterile drape was applied covering the operative field. Maximum barrier sterile technique with sterile gowns and gloves were used for the procedure. Local anesthesia was provided with 1% lidocaine. Ultrasound was used to confirm patency of the right internal jugular vein. After creating a small venotomy incision, a 21 gauge needle was advanced into the right internal jugular vein under direct, real-time ultrasound guidance. Ultrasound image documentation was performed. After securing guidewire access, an 8 Fr dilator was placed. A J-wire was kinked to measure appropriate catheter length. A Palindrome tunneled hemodialysis catheter measuring 19 cm from tip to cuff was chosen for placement. This was tunneled in a retrograde fashion from the chest wall to the venotomy incision. At the venotomy, serial dilatation was performed and a 16 Fr peel-away sheath was placed over a guidewire. The catheter was then placed through the sheath and the sheath removed. Final catheter positioning was confirmed and documented with a fluoroscopic spot image. The catheter was aspirated, flushed with saline, and injected with appropriate volume heparin dwells. The venotomy incision was closed with subcutaneous 3-0 Monocryl and subcuticular 4-0 Vicryl. Dermabond was applied to the incision. The catheter exit site was secured with 0-Prolene retention sutures. COMPLICATIONS: None.  No pneumothorax. FINDINGS: After  catheter placement, the tip lies in the right atrium. The catheter aspirates normally and is ready for immediate use. IMPRESSION: Placement of tunneled hemodialysis catheter via the right internal jugular vein. The catheter tip lies in the right atrium. The catheter is ready for immediate use. Electronically  Signed   By: Aletta Edouard M.D.   On: 04/29/2019 16:57      Scheduled Meds:  amLODipine  10 mg Oral Daily   aspirin EC  81 mg Oral Daily   atorvastatin  40 mg Oral QPM   Chlorhexidine Gluconate Cloth  6 each Topical Q0600   darbepoetin (ARANESP) injection - DIALYSIS  40 mcg Intravenous Q Sat-HD   famotidine  20 mg Oral Daily   FLUoxetine  10 mg Oral Daily   gabapentin  100 mg Oral BID   heparin  5,000 Units Subcutaneous Q8H   insulin aspart  0-5 Units Subcutaneous QHS   insulin aspart  0-9 Units Subcutaneous TID WC   mouth rinse  15 mL Mouth Rinse BID   metoprolol tartrate  100 mg Oral BID   mupirocin ointment  1 application Nasal BID   pantoprazole  40 mg Oral Daily   polyethylene glycol  17 g Oral Daily   sodium bicarbonate  1,300 mg Oral BID   sodium chloride flush  3 mL Intravenous Q12H   Continuous Infusions:  sodium chloride       LOS: 3 days      Time spent: 25 minutes   Dessa Phi, DO [facility] 05/01/2019, 12:23 PM   Available via Epic secure chat 7am-7pm After these hours, please refer to coverage provider listed on [email or web address]

## 2019-05-01 NOTE — Care Management (Signed)
Email sent to [name] Michael renal navigator to notify of new HD at request of CSW.

## 2019-05-01 NOTE — NC FL2 (Signed)
[place] MEDICAID FL2 LEVEL OF CARE SCREENING TOOL     IDENTIFICATION  Patient Name: [name] Michael Birthdate: 1960/11/05 Sex: female Admission Date (Current Location): 04/28/2019  [place] and [place] Number:  [name] Michael [id number] [place] and Address:  [facility], [place] [address] [place], [place] [address]      Provider Number: [id number]  Attending Physician Name and Address:  Dessa Phi, DO  Relative Name and Phone Number:  Deloria Lair, sister, [phone]    Current Level of Care: Hospital Recommended Level of Care: [place] Prior Approval Number:    Date Approved/Denied: 07/01/16 PASRR Number: [id number]  Discharge Plan: Other (Comment)    Current Diagnoses: Patient Active Problem List   Diagnosis Date Noted  . AMS (altered mental status) 04/28/2019  . Hypoxemia 04/28/2019  . Fall 06/09/2018  . Fall at home, initial encounter 06/09/2018  . Anemia of chronic disease 06/09/2018  . Syncope and collapse 06/09/2018  . CKD (chronic kidney disease) stage 5, GFR less than 15 ml/min (HCC) 03/10/2018  . Acute encephalopathy 03/10/2018  . Hypermagnesemia 03/10/2018  . AKI (acute kidney injury) ([place]) 11/09/2016  . Hypercalcemia 11/09/2016  . Hyponatremia 11/09/2016  . Hypovolemia 11/09/2016  . Accelerated hypertension 11/09/2016  . Acute lower UTI 11/09/2016  . Nephrolithiasis 10/24/2016  . Constipation 10/22/2016  . CAP (community acquired pneumonia) 10/22/2016  . Hydronephrosis of right kidney 10/22/2016  . Metabolic acidosis 0000000  . CVA (cerebral vascular accident) ([place]) 07/03/2016  . HAP (hospital-acquired pneumonia) 07/03/2016  . Sepsis secondary to UTI ([place])   . UTI due to extended-spectrum beta lactamase (ESBL) producing Escherichia coli   . Acute pyelonephritis   . Bacteremia due to Escherichia coli   . Colitis, indeterminate   . Uncontrolled type 2 diabetes mellitus with complication ([place])   .  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 associated with diabetes mellitus due to underlying condition ([place])   . Chronic diastolic CHF (congestive heart failure) ([place])   . Abdominal pain   . Colitis   . Hypophosphatemia 06/12/2016  . Hypokalemia 06/11/2016  . Hypocalcemia 06/11/2016  . Acute kidney injury superimposed on chronic kidney disease ([place]) 06/11/2016  . Proliferative diabetic retinopathy ([place]) 04/29/2016  . Closed fracture of left distal radius 01/28/2016  . Poor social situation 03/09/2013  . Depression 03/01/2013  . Abnormal mammogram 12/20/2012  . Retinal detachment 11/17/2012  . 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 ([place]) 123456  . Hyperlipidemia 02/09/2007  . Essential hypertension 02/09/2007  . GERD 02/09/2007    Orientation RESPIRATION BLADDER Height & Weight     Self, Time, Situation, Place  O2(3 L, Nasal cannula) Continent Weight: 106 lb 4.2 oz (48.2 kg) Height:  4\' 6"  (137.2 cm)  BEHAVIORAL SYMPTOMS/MOOD NEUROLOGICAL BOWEL NUTRITION STATUS      Continent Diet  AMBULATORY STATUS COMMUNICATION OF NEEDS Skin   Limited Assist Verbally Bruising                       Personal Care Assistance Level of Assistance  Feeding, Bathing, Dressing Bathing Assistance: Limited assistance Feeding assistance: Limited assistance Dressing Assistance: Limited assistance     Functional Limitations Info  Sight, Hearing, Speech          SPECIAL CARE FACTORS FREQUENCY  PT (By licensed PT), OT (By licensed OT)     PT Frequency: 5x weekly OT Frequency: 5x weekly  Contractures Contractures Info: Not present    Additional Factors Info  Code Status, Allergies Code Status Info: Full Allergies Info: Hydrocodone           Current Medications (05/01/2019):  This is the current hospital active medication list Current Facility-Administered Medications  Medication Dose Route Frequency Provider Last Rate Last  Dose  . 0.9 %  sodium chloride infusion  250 mL Intravenous PRN Merton Border, MD      . acetaminophen (TYLENOL) tablet 650 mg  650 mg Oral Q4H PRN Merton Border, MD      . amLODipine (NORVASC) tablet 10 mg  10 mg Oral Daily Merton Border, MD   10 mg at 05/01/19 0911  . aspirin EC tablet 81 mg  81 mg Oral Daily Merton Border, MD   81 mg at 05/01/19 0911  . atorvastatin (LIPITOR) tablet 40 mg  40 mg Oral QPM Merton Border, MD   40 mg at 04/30/19 1731  . Chlorhexidine Gluconate Cloth 2 % PADS 6 each  6 each Topical Q0600 Merton Border, MD   6 each at 05/01/19 0911  . Darbepoetin Alfa (ARANESP) injection 40 mcg  40 mcg Intravenous Q Sat-HD Lynnda Child, PA-C   40 mcg at 05/01/19 [id number]  . diphenhydrAMINE (BENADRYL) capsule 25 mg  25 mg Oral Q4H PRN Merton Border, MD      . famotidine (PEPCID) tablet 20 mg  20 mg Oral Daily Blount, Scarlette Shorts T, NP   20 mg at 05/01/19 0911  . FLUoxetine (PROZAC) capsule 10 mg  10 mg Oral Daily Merton Border, MD   10 mg at 05/01/19 0959  . gabapentin (NEURONTIN) capsule 100 mg  100 mg Oral BID Merton Border, MD   100 mg at 05/01/19 0911  . heparin injection 5,000 Units  5,000 Units Subcutaneous Q8H Dessa Phi, DO   5,000 Units at 05/01/19 0558  . insulin aspart (novoLOG) injection 0-5 Units  0-5 Units Subcutaneous QHS Merton Border, MD      . insulin aspart (novoLOG) injection 0-9 Units  0-9 Units Subcutaneous TID WC Merton Border, MD   2 Units at 05/01/19 1137  . MEDLINE mouth rinse  15 mL Mouth Rinse BID Merton Border, MD   15 mL at 05/01/19 0912  . metoprolol tartrate (LOPRESSOR) tablet 100 mg  100 mg Oral BID Merton Border, MD   100 mg at 05/01/19 0917  . mupirocin ointment (BACTROBAN) 2 % 1 application  1 application Nasal BID Merton Border, MD   1 application at [id number]  . ondansetron (ZOFRAN) tablet 4 mg  4 mg Oral Q6H PRN Merton Border, MD       Or  . ondansetron (ZOFRAN) injection 4 mg  4 mg Intravenous Q6H PRN Merton Border, MD      . oxyCODONE (Oxy IR/ROXICODONE) immediate  release tablet 5 mg  5 mg Oral Q4H PRN Merton Border, MD      . pantoprazole (PROTONIX) EC tablet 40 mg  40 mg Oral Daily Merton Border, MD   40 mg at 05/01/19 0911  . polyethylene glycol (MIRALAX / GLYCOLAX) packet 17 g  17 g Oral Daily Hijazi, Deatra Canter, MD      . senna-docusate (Senokot-S) tablet 1 tablet  1 tablet Oral QHS PRN Merton Border, MD      . sodium bicarbonate tablet 1,300 mg  1,300 mg Oral BID Merton Border, MD   1,300 mg at 05/01/19 0911  . sodium chloride flush (NS) 0.9 % injection 3 mL  3 mL Intravenous Q12H Merton Border, MD   3 mL at 05/01/19 0918  . sodium chloride flush (NS) 0.9 % injection 3 mL  3 mL Intravenous PRN Merton Border, MD         Discharge Medications: Please see discharge summary for a list of discharge medications.  Relevant Imaging Results:  Relevant Lab Results:   Additional Information SSN: [id number]  Oretha Milch, LCSW

## 2019-05-01 NOTE — TOC Progression Note (Addendum)
Transition of Care [facility]) - Progression Note    Patient Details  Name: MACKENZI KROGH MRN: [id number] Date of Birth: 1960-07-27  Transition of Care [facility]) CM/SW [place], LCSW Phone Number: 05/01/2019, 11:57 AM  Clinical Narrative:  CSW received consult for skilled nursing facility placement. CSW met with patient and assessed for skilled nursing facility placement  concerns. CSW notes patient reports that she is open to SNF referral. Patient was guarded to discussion of skilled nursing facility placement. CSW discussed community resources and options for support. CSW noted patient was somewhat guarded but did provide CSW consent to pursue.   CSW reached out to patient's sister regarding patient's skilled nursing facility placement concerns/questions. CSW was unable to speak with sister as they did not answer the phone and CSW requested a return call.    CSW informed patient  of SNFs in the [place] area that patient may qualify for with their medical needs and insurance coverage. CSW informed patient of the following possible barriers to placement: insurance authorization. CSW informed patient that as patient is under inpatient hospitalization status a list of SNF options have been provided. CSW will refer patient out to providers and will follow-up with patient regarding bed offers for SNF choice.. CSW informed patient of the referral process and will follow-up with patient with the SNF bed offers and CMS nursing home list to provide information for patient to make a decision. CSW inquired if patienthad further questions at this time and noted no current questions or concerns.    Expected Discharge Plan: Skilled Nursing Facility Barriers to Discharge: Insurance Authorization  Expected Discharge Plan and Services Expected Discharge Plan: [place]: [place] arrangements for the past 2 months: [place]                                       Social Determinants of Health (SDOH) Interventions    Readmission Risk Interventions Readmission Risk Prevention Plan 04/29/2019  Transportation Screening Complete  PCP or Specialist Appt within 3-5 Days Complete  HRI or [place] Complete  Social Work Consult for [place] Planning/Counseling Complete  Palliative Care Screening Not Applicable  Medication Review Press photographer) Referral to Pharmacy  Some recent data might be hidden

## 2019-05-01 NOTE — Progress Notes (Signed)
Occupational Therapy Evaluation Patient Details Name: [name] Michael MRN: [id number] DOB: May 20, 1961 Today's Date: 05/01/2019    History of Present Illness Ms. Foos is 58 y.o female, presenting to [facility] for 2nd stage basilic wein transposition L arm. PMH CKD DM, HLD, HTN, CVA, and TIA.    Clinical Impression   Pt presents with above diagnosis,  PTA pt PLOF living in assisted requiring assistance with ADLs and IADLs for safety and limited function due to decreased cognition and increased exertion. Pt will benefit from additional acute OT to address safety with ADLs and to improve strength for functional transfers and mobility with cognition to maxmize independence prior to dc to SNF level.     Follow Up Recommendations  SNF;Supervision/Assistance - 24 hour    Equipment Recommendations       Recommendations for Other Services       Precautions / Restrictions Precautions Precautions: None Restrictions Weight Bearing Restrictions: No      Mobility Bed Mobility Overal bed mobility: Needs Assistance Bed Mobility: Supine to Sit     Supine to sit: Min guard;Min assist     General bed mobility comments: 1 hand held assist to elevate trunk to sit up at EOB.   Transfers Overall transfer level: Needs assistance Equipment used: Rolling walker (2 wheeled) Transfers: Sit to/from Stand;Lateral/Scoot Transfers Sit to Stand: Min guard;Min assist        Lateral/Scoot Transfers: Supervision;Min guard      Balance                                           ADL either performed or assessed with clinical judgement   ADL Overall ADL's : Needs assistance/impaired Eating/Feeding: Modified independent Eating/Feeding Details (indicate cue type and reason): set up in sitting Grooming: Wash/dry hands;Wash/dry face;Oral care;Brushing hair;Modified independent Grooming Details (indicate cue type and reason): in sitting         Upper Body Dressing : Independent    Lower Body Dressing: Min guard;Set up Lower Body Dressing Details (indicate cue type and reason): in sitting for safety Toilet Transfer: Min guard;Minimal assistance Toilet Transfer Details (indicate cue type and reason): min guard for to min A for simulated toilet transfer from bed back to bed with VC's for sequencing and hand placement         Functional mobility during ADLs: Min guard;Minimal assistance General ADL Comments: Overall, demonstrates decreased strength, and standing balance to perform ADLs in dynamic standing. VC's required for safety. RW appropriate at this time.     Vision Baseline Vision/History: (L eye blindness)       Perception     Praxis      Pertinent Vitals/Pain Pain Assessment: No/denies pain     Hand Dominance Right   Extremity/Trunk Assessment Upper Extremity Assessment Upper Extremity Assessment: Generalized weakness   Lower Extremity Assessment Lower Extremity Assessment: Defer to PT evaluation   Cervical / Trunk Assessment Cervical / Trunk Assessment: Normal   Communication Communication Communication: No difficulties(Slow to respond, requires questions repeated for clarity)   Cognition Arousal/Alertness: Awake/alert Behavior During Therapy: Flat affect                                   General Comments: Ox2, disoriented to day.    General Comments  Vital readings BP supine:148/69, sitting EOB  143/63, standing 157/65. Pulse rate 74 supine, 76 sitting, 75 standing    Exercises     Shoulder Instructions      Home Living Family/patient expects to be discharged to:: Assisted living                             Home Equipment: Kasandra Knudsen - single point   Additional Comments: Pt lived in assisted living reports not requiring assistance with ADLs, however, demonstrates decreased strength and safety awareness to perform ADL independently      Prior Functioning/Environment Level of Independence: Needs assistance   Gait / Transfers Assistance Needed: reports utilizing a cane ADL's / Homemaking Assistance Needed: Pt reports assist with bathing, but does her own dressing, toileting, and eating.    Comments: Pt reports breakings B wrists prior to admission. Unclear of exact event and when it happened        OT Problem List: Decreased strength;Decreased activity tolerance;Impaired balance (sitting and/or standing);Decreased safety awareness;Decreased cognition;Decreased knowledge of use of DME or AE;Pain      OT Treatment/Interventions: Self-care/ADL training;Therapeutic exercise;DME and/or AE instruction;Therapeutic activities;Cognitive remediation/compensation;Patient/family education;Balance training    OT Goals(Current goals can be found in the care plan section) Acute Rehab OT Goals Patient Stated Goal: no goal states OT Goal Formulation: Patient unable to participate in goal setting Time For Goal Achievement: 05/15/19 Potential to Achieve Goals: Fair  OT Frequency: Min 2X/week   Barriers to D/C:            Co-evaluation              AM-PAC OT "6 Clicks" Daily Activity     Outcome Measure Help from another person eating meals?: None Help from another person taking care of personal grooming?: None Help from another person toileting, which includes using toliet, bedpan, or urinal?: A Lot Help from another person bathing (including washing, rinsing, drying)?: A Lot Help from another person to put on and taking off regular upper body clothing?: A Little Help from another person to put on and taking off regular lower body clothing?: A Little 6 Click Score: 18   End of Session Equipment Utilized During Treatment: Gait belt;Rolling walker Nurse Communication: Mobility status  Activity Tolerance: Patient limited by lethargy Patient left: in bed;with bed alarm set;with call bell/phone within reach  OT Visit Diagnosis: Unsteadiness on feet (R26.81);Muscle weakness (generalized) (M62.81)                 Time: [id number] OT Time Calculation (min): 22 min Charges:  OT General Charges $OT Visit: 1 Visit OT Evaluation $OT Eval Low Complexity: [place], MSOT, OTR/L  Supplemental Rehabilitation Services  [phone]   Marius Ditch 05/01/2019, 10:34 AM

## 2019-05-01 NOTE — Evaluation (Signed)
Physical Therapy Evaluation Patient Details Name: [name] Michael MRN: [id number] DOB: [birth year] Today's Date: 05/01/2019   History of Present Illness  [name] Michael is 58 y.o female, presenting to [facility] for 2nd stage basilic vein transposition L arm. PMH CKD DM, HLD, HTN, CVA, HIV, and TIA.   Clinical Impression  Pt admitted with above diagnosis. At the time of PT eval pt was able to perform transfers and ambulation with up to mod assist for balance support and safety with [facility] for support. Feel pt would benefit from a youth walker, as she is at a high risk for falls at this time. Pt on 3L/min supplemental O2 at rest, however increased to 4L/min O2 during activity as O2 sats decreased to 84% on 3L. See following progress note for O2 qualification. Pt currently with functional limitations due to the deficits listed below (see PT Problem List). Pt will benefit from skilled PT to increase their independence and safety with mobility to allow discharge to the venue listed below.       Follow Up Recommendations Home health PT;Supervision for mobility/OOB    Equipment Recommendations  Rolling walker with 5" wheels(Youth size)    Recommendations for Other Services       Precautions / Restrictions Precautions Precautions: Fall Precaution Comments: Watch O2 Restrictions Weight Bearing Restrictions: No      Mobility  Bed Mobility Overal bed mobility: Needs Assistance Bed Mobility: Supine to Sit     Supine to sit: Min assist     General bed mobility comments: Pt initiating without assistance with heavy use of railing. Pt reaching for therapist's hand to pull herself into full sitting position. Increased time to gain/maintain sitting balance as pt's feet did not touch the floor.   Transfers Overall transfer level: Needs assistance Equipment used: Straight cane Transfers: Sit to/from Stand Sit to Stand: Min assist        Lateral/Scoot Transfers: Supervision;Min guard General transfer  comment: Assist for balance support as pt powered up to full stand.  Ambulation/Gait Ambulation/Gait assistance: Mod assist Gait Distance (Feet): 75 Feet Assistive device: Straight cane Gait Pattern/deviations: Step-through pattern;Decreased stride length;Drifts right/left Gait velocity: Decreased Gait velocity interpretation: <1.31 ft/sec, indicative of household ambulator General Gait Details: Mod assist provided for unsteadiness and LOB with SPC. Pt with poor awareness of deficits and slow response to LOB. Relied on therapist to keep her from falling vs trying to recover independently.   Stairs            Wheelchair Mobility    Modified Rankin (Stroke Patients Only)       Balance Overall balance assessment: Mild deficits observed, not formally tested                                           Pertinent Vitals/Pain Pain Assessment: No/denies pain    Home Living Family/patient expects to be discharged to:: Assisted living               Home Equipment: Kasandra Knudsen - single point Additional Comments: Pt lived in assisted living reports not requiring assistance with ADLs, however, demonstrates decreased strength and safety awareness to perform ADL independently    Prior Function Level of Independence: Needs assistance   Gait / Transfers Assistance Needed: reports utilizing a cane  ADL's / Homemaking Assistance Needed: Pt reports assist with bathing, but does her own dressing, toileting, and feeding.  Comments: Pt reports breakings B wrists prior to admission. Unclear of exact event and when it happened     Hand Dominance   Dominant Hand: Right    Extremity/Trunk Assessment   Upper Extremity Assessment Upper Extremity Assessment: Generalized weakness    Lower Extremity Assessment Lower Extremity Assessment: Generalized weakness    Cervical / Trunk Assessment Cervical / Trunk Assessment: Other exceptions Cervical / Trunk Exceptions: Forward  head posture with rounded shoulders. Able to improve with VC's.   Communication   Communication: No difficulties(Slow to respond, requires questions repeated for clarity)  Cognition Arousal/Alertness: Awake/alert Behavior During Therapy: Flat affect Overall Cognitive Status: Impaired/Different from baseline Area of Impairment: Memory;Safety/judgement;Awareness;Problem solving                     Memory: Decreased short-term memory   Safety/Judgement: Decreased awareness of safety;Decreased awareness of deficits Awareness: Emergent Problem Solving: Slow processing;Decreased initiation;Difficulty sequencing;Requires verbal cues General Comments: Ox2, disoriented to day.       General Comments General comments (skin integrity, edema, etc.): Vital readings BP supine:148/69, sitting EOB 143/63, standing 157/65. Pulse rate 74 supine, 76 sitting, 75 standing    Exercises     Assessment/Plan    PT Assessment Patient needs continued PT services  PT Problem List Decreased strength;Decreased activity tolerance;Decreased balance;Decreased mobility;Decreased cognition;Decreased knowledge of use of DME;Decreased safety awareness       PT Treatment Interventions DME instruction;Gait training;Functional mobility training;Therapeutic activities;Therapeutic exercise;Neuromuscular re-education;Patient/family education    PT Goals (Current goals can be found in the Care Plan section)  Acute Rehab PT Goals Patient Stated Goal: no goal stated PT Goal Formulation: With patient Time For Goal Achievement: 05/08/19 Potential to Achieve Goals: Good    Frequency Min 3X/week   Barriers to discharge        Co-evaluation               AM-PAC PT "6 Clicks" Mobility  Outcome Measure Help needed turning from your back to your side while in a flat bed without using bedrails?: None Help needed moving from lying on your back to sitting on the side of a flat bed without using bedrails?: A  Little Help needed moving to and from a bed to a chair (including a wheelchair)?: A Little Help needed standing up from a chair using your arms (e.g., wheelchair or bedside chair)?: A Little Help needed to walk in hospital room?: A Little Help needed climbing 3-5 steps with a railing? : A Lot 6 Click Score: 18    End of Session Equipment Utilized During Treatment: Gait belt Activity Tolerance: Patient tolerated treatment well Patient left: in chair;with call bell/phone within reach;with nursing/sitter in room Nurse Communication: Mobility status PT Visit Diagnosis: Unsteadiness on feet (R26.81);Muscle weakness (generalized) (M62.81)    Time: [id number] PT Time Calculation (min) (ACUTE ONLY): 28 min   Charges:   PT Evaluation $PT Eval Moderate Complexity: 1 Mod PT Treatments $Gait Training: 8-22 mins        Rolinda Roan, PT, DPT Acute Rehabilitation Services Pager: [phone] Office: [phone]   Thelma Comp 05/01/2019, 11:55 AM

## 2019-05-01 NOTE — Progress Notes (Addendum)
[facility] Progress Note   Subjective:  2nd HD last night with 2L removed. No new complaints today. Says she's going home today.  Denies CP, SOB, N,V,D.   Objective Vitals:   05/01/19 0345 05/01/19 0356 05/01/19 0437 05/01/19 0844  BP: (!) 149/73 (!) 148/81 (!) 152/67 (!) 159/63  Pulse: 68 68 73 76  Resp:  12 14 17   Temp:  98.3 F (36.8 C) 98.9 F (37.2 C) 98.7 F (37.1 C)  TempSrc:  Oral Oral Oral  SpO2:  100% 99% 98%  Weight:  48.2 kg    Height:        Physical Exam General: Well appearing female on nasal oxygen Heart: RRR Lungs: clear bilaterally  Abdomen: soft NTND Extremities: No sig LE edema Dialysis Access: R IJ TDC ;  [name] BVT +bruit    Weight change: -1.1 kg   Additional Objective Labs: Basic Metabolic Panel: Recent Labs  Lab 04/29/19 0631 04/30/19 0412 05/01/19 0528  NA 141 137 140  K 5.4* 4.1 3.6  CL 104 102 102  CO2 17* 22 27  GLUCOSE 102* 110* 110*  BUN 69* 38* 11  CREATININE 5.62* 3.45* 1.69*  CALCIUM 8.7* 8.3* 8.3*  PHOS  --  4.3  --    CBC: Recent Labs  Lab 04/28/19 0745 04/28/19 0942 04/29/19 0631 04/30/19 0412 05/01/19 0528  WBC 9.0 10.3 10.7* 10.8* 9.1  NEUTROABS  --  8.0*  --   --   --   HGB 8.7* 9.0* 8.1* 7.5* 8.0*  HCT 28.2* 29.0* 27.0* 24.6* 26.9*  MCV 89.5 89.5 93.4 92.1 93.7  PLT 391 445* 406* 333 333   Blood Culture    Component Value Date/Time   SDES BLOOD RIGHT ARM 04/28/2019 2144   SPECREQUEST  04/28/2019 2144    BOTTLES DRAWN AEROBIC ONLY Blood Culture results may not be optimal due to an inadequate volume of blood received in culture bottles   CULT  04/28/2019 2144    NO GROWTH 3 DAYS Performed at [facility], [address], [place], [place] [address]    REPTSTATUS PENDING 04/28/2019 2144     Medications: . sodium chloride    . ferric gluconate (FERRLECIT/NULECIT) IV 250 mg (05/01/19 0914)   . amLODipine  10 mg Oral Daily  . aspirin EC  81 mg Oral Daily  . atorvastatin  40 mg  Oral QPM  . Chlorhexidine Gluconate Cloth  6 each Topical Q0600  . darbepoetin (ARANESP) injection - DIALYSIS  40 mcg Intravenous Q Sat-HD  . famotidine  20 mg Oral Daily  . FLUoxetine  10 mg Oral Daily  . gabapentin  100 mg Oral BID  . heparin  5,000 Units Subcutaneous Q8H  . insulin aspart  0-5 Units Subcutaneous QHS  . insulin aspart  0-9 Units Subcutaneous TID WC  . mouth rinse  15 mL Mouth Rinse BID  . metoprolol tartrate  100 mg Oral BID  . mupirocin ointment  1 application Nasal BID  . pantoprazole  40 mg Oral Daily  . polyethylene glycol  17 g Oral Daily  . sodium bicarbonate  1,300 mg Oral BID  . sodium chloride flush  3 mL Intravenous Q12H    Dialysis Orders:  New HD start,  CLIP to [facility] TTS 12:30 pm   Assessment/Plan: 1. ESRD. Progressive CKD V. HD indicated with uremic symptoms/volume excess.  s/p left 2nd stage BVT by Dr. Carlis Abbott on 04/11/19. R IJ TDC placed 11/6 by IR. 1st HD  on 11/6. S/p HD x 2. Tolerating UF so far. Cr 1.69 this am.  Plan next HD 11/10 2. Hypertension/volume  - BP stable. On amlodipine 10, metoprolol 100.  4 kg removed so far. Continue to titrate volume down as tolerated. Keep SBP >100. Monitor. Continue to probe for EDW.  3. Anemia  - Hgb 8.0  IV Fe repleted. Tsat 41%.  Aranesp 60 on [id number].  4. Metabolic bone disease -  Ca/Phos ok. No binders/VDRA yet  5. Nutrition - Renal diet/ Fluid restriction 6. DM Type 2 - insulin per primary    Lynnda Child PA-C [place] Pager [phone] 05/01/2019,10:40 AM  LOS: 3 days   Nephrology attending: Seen and examined at bedside. Chart reviewed. I agree with above. 58 year old female with progressive CKD 5toESRD now with uremic symptoms.  Status post HD on 11/6 and 11/7, Placement of right IJ catheter by IR, tolerated well.  She had s/p left 2nd stage BVT by Dr. Carlis Abbott on 04/11/19. -Next HD on Tuesday.  Patient was lying on bed, not in distress. Reports weakness but denied chest pain  or shortness of breath. On examination nolower extreme edema. Lungs clear.  Patient has OP HD treatment arranged at [facility] clinic on a TTS schedule with a seat time of 12:30pm. Okay to discharge from renal perspective.    Waiting for SNF.  Lawson Radar, MD [facility].

## 2019-05-01 NOTE — Progress Notes (Signed)
SATURATION QUALIFICATIONS: (This note is used to comply with regulatory documentation for home oxygen)  Patient Saturations on Room Air at Rest = 78%  Patient Saturations on Room Air while Ambulating = Not tested - ambulated initially on 3L/min supplemental O2 and sats dropped to 84%  Patient Saturations on 4 Liters of oxygen while Ambulating = 95%  Please briefly explain why patient needs home oxygen: Pt is unable to maintain O2 sats >78% on RA at rest, and unable to maintain O2 sats >84% on 3L/min supplemental O2.   See previous note for mobility details from PT evaluation.  Rolinda Roan, PT, DPT Acute Rehabilitation Services Pager: [phone] Office: [phone]

## 2019-05-02 LAB — RENAL FUNCTION PANEL
Albumin: 3 g/dL — ABNORMAL LOW (ref 3.5–5.0)
Anion gap: 10 (ref 5–15)
BUN: 19 mg/dL (ref 6–20)
CO2: 26 mmol/L (ref 22–32)
Calcium: 9 mg/dL (ref 8.9–10.3)
Chloride: 106 mmol/L (ref 98–111)
Creatinine, Ser: 2.86 mg/dL — ABNORMAL HIGH (ref 0.44–1.00)
GFR calc Af Amer: 20 mL/min — ABNORMAL LOW (ref >60)
GFR calc non Af Amer: 17 mL/min — ABNORMAL LOW (ref >60)
Glucose, Bld: 134 mg/dL — ABNORMAL HIGH (ref 70–99)
Phosphorus: 4.1 mg/dL (ref 2.5–4.6)
Potassium: 4 mmol/L (ref 3.5–5.1)
Sodium: 142 mmol/L (ref 135–145)

## 2019-05-02 LAB — GLUCOSE, CAPILLARY
Glucose-Capillary: 132 mg/dL — ABNORMAL HIGH (ref 70–99)
Glucose-Capillary: 119 mg/dL — ABNORMAL HIGH (ref 70–99)
Glucose-Capillary: 130 mg/dL — ABNORMAL HIGH (ref 70–99)
Glucose-Capillary: 170 mg/dL — ABNORMAL HIGH (ref 70–99)
Glucose-Capillary: 127 mg/dL — ABNORMAL HIGH (ref 70–99)
Glucose-Capillary: 219 mg/dL — ABNORMAL HIGH (ref 70–99)

## 2019-05-02 LAB — PANEL 083904
HIV 1 AB: NEGATIVE
HIV 2 AB: NEGATIVE
Note: NEGATIVE

## 2019-05-02 MED ORDER — CHLORHEXIDINE GLUCONATE CLOTH 2 % EX PADS
6.0000 | MEDICATED_PAD | Freq: Every day | CUTANEOUS | Status: DC
  Administered 2019-05-03 – 2019-05-04 (×2): 6 via TOPICAL

## 2019-05-02 NOTE — TOC Progression Note (Addendum)
Transition of Care [facility]) - Progression Note    Patient Details  Name: [name] Michael MRN: [id number] Date of Birth: 1960-11-10  Transition of Care [facility]) CM/SW Contact  [name] Lefevre Edson [place], RN Phone Number: 05/02/2019, 11:50 AM  Clinical Narrative:     Gave patient bed offers. Patient asked NCM to call sister [name] Michael. Called and left voicemail.Patient requested NCM to call niece [name] Michael. Left voicemail for [employer] Patient's sister returned call. [name] Michael was unaware her sister was even in the hospital. With patient's consent discussed medical reasons. [name] Michael requesting MD to call her. Patient consented . Messaged [name] Michael. Bed offers were given to both patient and [name] Michael. [name] Michael stated patient makes her own decisions. Explained patient would like assistance from [place] in picking SNF. [name] Michael will call patient directly. Expected Discharge Plan: Skilled Nursing Facility Barriers to Discharge: Insurance Authorization  Expected Discharge Plan and Services Expected Discharge Plan: [place] Choice: [place] arrangements for the past 2 months: [place]                                       Social Determinants of Health (SDOH) Interventions    Readmission Risk Interventions Readmission Risk Prevention Plan 04/29/2019  Transportation Screening Complete  PCP or Specialist Appt within 3-5 Days Complete  HRI or [place] Complete  Social Work Consult for [place] Planning/Counseling Complete  Palliative Care Screening Not Applicable  Medication Review Press photographer) Referral to Pharmacy  Some recent data might be hidden

## 2019-05-02 NOTE — Progress Notes (Signed)
PROGRESS NOTE    [name] Michael  [id number] DOB: 05/30/1961 DOA: 04/28/2019 PCP: Charlott Rakes, MD     Brief Narrative:  [name] Michael is a 58 y.o. female with past medical history of end-stage renal disease in preparation for dialysis who came to outpatient surgery for scheduled tunneled hemodialysis catheter.  Patient has new left AV fistula placed on 8/31-second stage on 10/19 by Dr. Carlis Abbott. Patient was noted to be lethargic with saturation of 80% on room air.  She reports being sick for the last 2 to 3 days with generalized weakness, fatigue and cough.  Reports diffuse abdominal pain but denies any chest pains or shortness of breath.  The patient was started on oxygen 3 L nasal cannula with saturation going up to 94%.  She denies being in contact with anybody with COVID-19 and had a negative Covid test earlier this month. She was referred to the ED.   New events last 24 hours / Subjective: Doing well. No new complaints.   Assessment & Plan:   Active Problems:   AMS (altered mental status)   Hypoxemia   ESRD -Status post placement of AV fistula -TDC catheter placed 11/6  -Nephrology following for HD   Acute hypoxemic respiratory failure -Thought to be secondary to fluid overload from ESRD -Covid 19 negative  -Continue nasal cannula O2, 3L currently. Continue to wean as tolerated   Type 2 diabetes, well controlled -Ha1c 6.5  -SSI  Hyperlipidemia -Continue Lipitor  Hypertension -Continue Lopressor, amlodipine   Depression -Continue Prozac  HIV positive -HIV screening test obtained on this admission showed HIV positive.  ID has ordered for HIV RNA which resulted <20    DVT prophylaxis: Subq hep  Code Status: Full code Family Communication: Spoke with sister over the phone  Disposition Plan: SNF placement. SW notified.    Consultants:   Nephrology  IR  Antimicrobials:  Anti-infectives (From admission, onward)   Start     Dose/Rate Route Frequency  Ordered Stop   04/29/19 1601  ceFAZolin (ANCEF) IVPB 1 g/50 mL premix     over 30 Minutes Intravenous Continuous PRN 04/29/19 1601 04/29/19 1601   04/29/19 1545  ceFAZolin (ANCEF) 2-4 GM/100ML-% IVPB    Note to Pharmacy: Gar Ponto   : cabinet override      04/29/19 1545 04/30/19 0359       Objective: Vitals:   05/01/19 1618 05/01/19 1755 05/01/19 2126 05/02/19 0600  BP: (!) 130/55 (!) 136/56 (!) 131/56 (!) 151/67  Pulse: 70 77 72 72  Resp: 18 17 16 18   Temp: 98.7 F (37.1 C) 98.6 F (37 C) 98 F (36.7 C) 98.5 F (36.9 C)  TempSrc: Oral Oral Oral Oral  SpO2: 94% 97% 97% 95%  Weight:      Height:        Intake/Output Summary (Last 24 hours) at 05/02/2019 1220 Last data filed at 05/02/2019 0942 Gross per 24 hour  Intake 243 ml  Output -  Net 243 ml   Filed Weights   04/30/19 0300 05/01/19 0040 05/01/19 0356  Weight: 49.6 kg 50.5 kg 48.2 kg    Examination: General exam: Appears calm and comfortable  Respiratory system: Clear to auscultation. Respiratory effort normal. 3L [place] O2  Cardiovascular system: S1 & S2 heard, RRR. No pedal edema. Gastrointestinal system: Abdomen is nondistended, soft and nontender. Normal bowel sounds heard. Central nervous system: Alert and oriented. Non focal exam. Speech clear  Extremities: Symmetric in appearance bilaterally  Skin: No rashes,  lesions or ulcers on exposed skin  Psychiatry: Judgement and insight appear stable. Mood & affect appropriate.    Data Reviewed: I have personally reviewed following labs and imaging studies  CBC: Recent Labs  Lab 04/28/19 0745 04/28/19 0942 04/29/19 0631 04/30/19 0412 05/01/19 0528  WBC 9.0 10.3 10.7* 10.8* 9.1  NEUTROABS  --  8.0*  --   --   --   HGB 8.7* 9.0* 8.1* 7.5* 8.0*  HCT 28.2* 29.0* 27.0* 24.6* 26.9*  MCV 89.5 89.5 93.4 92.1 93.7  PLT 391 445* 406* 333 0000000   Basic Metabolic Panel: Recent Labs  Lab 04/28/19 0942 04/29/19 0631 04/30/19 0412 05/01/19 0528 05/02/19 0556   NA 136 141 137 140 142  K 5.0 5.4* 4.1 3.6 4.0  CL 96* 104 102 102 106  CO2 21* 17* 22 27 26   GLUCOSE 118* 102* 110* 110* 134*  BUN 67* 69* 38* 11 19  CREATININE 5.63* 5.62* 3.45* 1.69* 2.86*  CALCIUM 8.6* 8.7* 8.3* 8.3* 9.0  PHOS  --   --  4.3  --  4.1   GFR: Estimated Creatinine Clearance: 13 mL/min (A) (by C-G formula based on SCr of 2.86 mg/dL (H)). Liver Function Tests: Recent Labs  Lab 04/28/19 0942 04/30/19 0412 05/02/19 0556  AST 93*  --   --   ALT 54*  --   --   ALKPHOS 108  --   --   BILITOT 0.9  --   --   PROT 7.0  --   --   ALBUMIN 3.7 3.2* 3.0*   No results for input(s): LIPASE, AMYLASE in the last 168 hours. Recent Labs  Lab 04/28/19 0942  AMMONIA 24   Coagulation Profile: Recent Labs  Lab 04/28/19 0745  INR 1.4*   Cardiac Enzymes: No results for input(s): CKTOTAL, CKMB, CKMBINDEX, TROPONINI in the last 168 hours. BNP (last 3 results) No results for input(s): PROBNP in the last 8760 hours. HbA1C: No results for input(s): HGBA1C in the last 72 hours. CBG: Recent Labs  Lab 05/01/19 1116 05/01/19 1554 05/01/19 2115 05/02/19 0646 05/02/19 1124  GLUCAP 160* 145* 142* 130* 170*   Lipid Profile: No results for input(s): CHOL, HDL, LDLCALC, TRIG, CHOLHDL, LDLDIRECT in the last 72 hours. Thyroid Function Tests: No results for input(s): TSH, T4TOTAL, FREET4, T3FREE, THYROIDAB in the last 72 hours. Anemia Panel: Recent Labs    04/30/19 0157  FERRITIN 1,086*  TIBC 253  IRON 103   Sepsis Labs: Recent Labs  Lab 04/28/19 0942 04/28/19 1200  LATICACIDVEN 1.5 1.1    Recent Results (from the past 240 hour(s))  Blood culture (routine x 2)     Status: None (Preliminary result)   Collection Time: 04/28/19 10:17 AM   Specimen: BLOOD  Result Value Ref Range Status   Specimen Description BLOOD RIGHT ANTECUBITAL  Final   Special Requests   Final    BOTTLES DRAWN AEROBIC AND ANAEROBIC Blood Culture adequate volume   Culture   Final    NO GROWTH 4  DAYS Performed at [facility], [place] [address], [place], [place] [address]    Report Status PENDING  Incomplete  SARS CORONAVIRUS 2 (TAT 6-24 HRS) Nasopharyngeal Nasopharyngeal Swab     Status: None   Collection Time: 04/28/19 10:51 AM   Specimen: Nasopharyngeal Swab  Result Value Ref Range Status   SARS Coronavirus 2 NEGATIVE NEGATIVE Final    Comment: (NOTE) SARS-CoV-2 target nucleic acids are NOT DETECTED. The SARS-CoV-2 RNA is generally  detectable in upper and lower respiratory specimens during the acute phase of infection. Negative results do not preclude SARS-CoV-2 infection, do not rule out co-infections with other pathogens, and should not be used as the sole basis for treatment or other patient management decisions. Negative results must be combined with clinical observations, patient history, and epidemiological information. The expected result is Negative. Fact Sheet for Patients: SugarRoll.be Fact Sheet for Healthcare Providers: https://www.woods-mathews.com/ This test is not yet approved or cleared by the [place] FDA and  has been authorized for detection and/or diagnosis of SARS-CoV-2 by FDA under an Emergency Use Authorization (EUA). This EUA will remain  in effect (meaning this test can be used) for the duration of the COVID-19 declaration under Section 56 4(b)(1) of the Act, 21 U.S.C. section 360bbb-3(b)(1), unless the authorization is terminated or revoked sooner. Performed at [facility], [place] [address] [place], [place] [address]   Urine culture     Status: Abnormal   Collection Time: 04/28/19  9:02 PM   Specimen: Urine, Clean Catch  Result Value Ref Range Status   Specimen Description URINE, CLEAN CATCH  Final   Special Requests   Final    NONE Performed at [facility], [place] [address] [place], [place] [address]    Culture MULTIPLE SPECIES PRESENT, SUGGEST RECOLLECTION (A)  Final    Report Status 04/30/2019 FINAL  Final  MRSA PCR Screening     Status: Abnormal   Collection Time: 04/28/19  9:02 PM   Specimen: Nasal Mucosa; Nasopharyngeal  Result Value Ref Range Status   MRSA by PCR POSITIVE (A) NEGATIVE Final    Comment:        The GeneXpert MRSA Assay (FDA approved for NASAL specimens only), is one component of a comprehensive MRSA colonization surveillance program. It is not intended to diagnose MRSA infection nor to guide or monitor treatment for MRSA infections. RESULT CALLED TO, READ BACK BY AND VERIFIED WITH: Mart Piggs [id number] 04/29/2019 Mena Goes Performed at [facility], [place] [address], [place], [place] [address]   Blood culture (routine x 2)     Status: None (Preliminary result)   Collection Time: 04/28/19  9:44 PM   Specimen: BLOOD RIGHT ARM  Result Value Ref Range Status   Specimen Description BLOOD RIGHT ARM  Final   Special Requests   Final    BOTTLES DRAWN AEROBIC ONLY Blood Culture results may not be optimal due to an inadequate volume of blood received in culture bottles   Culture   Final    NO GROWTH 4 DAYS Performed at [facility], [place] [address] [place],  [address]    Report Status PENDING  Incomplete      Radiology Studies: No results found.    Scheduled Meds: . amLODipine  10 mg Oral Daily  . aspirin EC  81 mg Oral Daily  . atorvastatin  40 mg Oral QPM  . Chlorhexidine Gluconate Cloth  6 each Topical Q0600  . darbepoetin (ARANESP) injection - DIALYSIS  40 mcg Intravenous Q Sat-HD  . famotidine  20 mg Oral Daily  . FLUoxetine  10 mg Oral Daily  . gabapentin  100 mg Oral BID  . heparin  5,000 Units Subcutaneous Q8H  . insulin aspart  0-5 Units Subcutaneous QHS  . insulin aspart  0-9 Units Subcutaneous TID WC  . mouth rinse  15 mL Mouth Rinse BID  . metoprolol tartrate  100 mg Oral BID  .  mupirocin ointment  1 application Nasal BID  . pantoprazole  40 mg Oral Daily  . polyethylene glycol  17 g Oral  Daily  . sodium bicarbonate  1,300 mg Oral BID  . sodium chloride flush  3 mL Intravenous Q12H   Continuous Infusions: . sodium chloride       LOS: 4 days      Time spent: 25 minutes   Dessa Phi, DO [facility] 05/02/2019, 12:20 PM   Available via Epic secure chat 7am-7pm After these hours, please refer to coverage provider listed on [email or web address]

## 2019-05-02 NOTE — Care Management Important Message (Signed)
Important Message  Patient Details  Name: [name] Michael MRN: [id number] Date of Birth: 1960-10-07   Medicare Important Message Given:  No Precaution in place no IM given.   [name] 05/02/2019, 3:08 PM

## 2019-05-02 NOTE — Progress Notes (Addendum)
[facility] Progress Note   Subjective:   Patient seen in room. Tired this AM. Reports ongoing SOB. Denies CP, palpitations, dizziness, abdominal pain, N/V/D.  Objective Vitals:   05/01/19 1755 05/01/19 2126 05/02/19 0600 05/02/19 1244  BP: (!) 136/56 (!) 131/56 (!) 151/67 127/65  Pulse: 77 72 72 66  Resp: 17 16 18 16   Temp: 98.6 F (37 C) 98 F (36.7 C) 98.5 F (36.9 C) 98.7 F (37.1 C)  TempSrc: Oral Oral Oral Oral  SpO2: 97% 97% 95% 93%  Weight:      Height:       Physical Exam General: Well developed, tired appearing female in NAD Heart: RRR, no murmurs, rubs or gallops Lungs: Decreased breath sounds. CTA bilaterally without wheezing, rhonchi or rales Abdomen: Soft, non-tender, non-distended. +BS Extremities: Trace edema bilateral lower extremities Dialysis Access:  R IJ TDC, [name] AVF + bruit  Additional Objective Labs: Basic Metabolic Panel: Recent Labs  Lab 04/30/19 0412 05/01/19 0528 05/02/19 0556  NA 137 140 142  K 4.1 3.6 4.0  CL 102 102 106  CO2 22 27 26   GLUCOSE 110* 110* 134*  BUN 38* 11 19  CREATININE 3.45* 1.69* 2.86*  CALCIUM 8.3* 8.3* 9.0  PHOS 4.3  --  4.1   Liver Function Tests: Recent Labs  Lab 04/28/19 0942 04/30/19 0412 05/02/19 0556  AST 93*  --   --   ALT 54*  --   --   ALKPHOS 108  --   --   BILITOT 0.9  --   --   PROT 7.0  --   --   ALBUMIN 3.7 3.2* 3.0*   No results for input(s): LIPASE, AMYLASE in the last 168 hours. CBC: Recent Labs  Lab 04/28/19 0745 04/28/19 0942 04/29/19 0631 04/30/19 0412 05/01/19 0528  WBC 9.0 10.3 10.7* 10.8* 9.1  NEUTROABS  --  8.0*  --   --   --   HGB 8.7* 9.0* 8.1* 7.5* 8.0*  HCT 28.2* 29.0* 27.0* 24.6* 26.9*  MCV 89.5 89.5 93.4 92.1 93.7  PLT 391 445* 406* 333 333   Blood Culture    Component Value Date/Time   SDES BLOOD RIGHT ARM 04/28/2019 2144   SPECREQUEST  04/28/2019 2144    BOTTLES DRAWN AEROBIC ONLY Blood Culture results may not be optimal due to an inadequate  volume of blood received in culture bottles   CULT  04/28/2019 2144    NO GROWTH 4 DAYS Performed at [place] [address], [place], [place] [address]    REPTSTATUS PENDING 04/28/2019 2144    Cardiac Enzymes: No results for input(s): CKTOTAL, CKMB, CKMBINDEX, TROPONINI in the last 168 hours. CBG: Recent Labs  Lab 05/01/19 1116 05/01/19 1554 05/01/19 2115 05/02/19 0646 05/02/19 1124  GLUCAP 160* 145* 142* 130* 170*   Iron Studies:  Recent Labs    04/30/19 0157  IRON 103  TIBC 253  FERRITIN 1,086*   @lablastinr3 @ Studies/Results: No results found. Medications: . sodium chloride     . amLODipine  10 mg Oral Daily  . aspirin EC  81 mg Oral Daily  . atorvastatin  40 mg Oral QPM  . Chlorhexidine Gluconate Cloth  6 each Topical Q0600  . darbepoetin (ARANESP) injection - DIALYSIS  40 mcg Intravenous Q Sat-HD  . famotidine  20 mg Oral Daily  . FLUoxetine  10 mg Oral Daily  . gabapentin  100 mg Oral BID  . heparin  5,000 Units Subcutaneous Q8H  .  insulin aspart  0-5 Units Subcutaneous QHS  . insulin aspart  0-9 Units Subcutaneous TID WC  . mouth rinse  15 mL Mouth Rinse BID  . metoprolol tartrate  100 mg Oral BID  . mupirocin ointment  1 application Nasal BID  . pantoprazole  40 mg Oral Daily  . polyethylene glycol  17 g Oral Daily  . sodium bicarbonate  1,300 mg Oral BID  . sodium chloride flush  3 mL Intravenous Q12H    Dialysis Orders: New HD start, CLIP to [facility] TTS 12:30 pm   Assessment/Plan: 1. ESRD. Progressive CKD V- HD indicated with uremic symptoms/volume excess. s/p left 2nd stage BVT by Dr. Carlis Abbott on 04/11/19. R IJ TDC placed 11/6 by IR. 1st HD on 11/6. S/p HD x 2. Tolerating UF so far. Cr 2.86 this am, K+ 4.0.  Plan next HD 11/10 2. Hypertension/volume - BP stable. On amlodipine 10, metoprolol 100.  4 kg removed so far. Continue to titrate volume down as tolerated. Keep SBP >100. Monitor. Continue to probe for EDW. 3. Anemia - Hgb 8.0  IV  Fe repleted. Tsat 41%.  Received aranesp 60 on [id number].  4. Metabolic bone disease -Ca/Phos ok. No binders/VDRA yet 5. Nutrition -Renal diet/ Fluid restriction 6. DM Type 2 - insulin per primary 7. HIV positive -found on HIV screen this admission. HIV RNA < 20.   Anice Paganini, PA-C 05/02/2019, 1:54 PM  [facility] Pager: [phone]  Pt seen, examined and agree w A/P as above.  Kelly Splinter  MD 05/02/2019, 3:00 PM

## 2019-05-03 LAB — BASIC METABOLIC PANEL
Anion gap: 12 (ref 5–15)
BUN: 26 mg/dL — ABNORMAL HIGH (ref 6–20)
CO2: 26 mmol/L (ref 22–32)
Calcium: 9 mg/dL (ref 8.9–10.3)
Chloride: 104 mmol/L (ref 98–111)
Creatinine, Ser: 3.43 mg/dL — ABNORMAL HIGH (ref 0.44–1.00)
GFR calc Af Amer: 16 mL/min — ABNORMAL LOW (ref >60)
GFR calc non Af Amer: 14 mL/min — ABNORMAL LOW (ref >60)
Glucose, Bld: 135 mg/dL — ABNORMAL HIGH (ref 70–99)
Potassium: 3.7 mmol/L (ref 3.5–5.1)
Sodium: 142 mmol/L (ref 135–145)

## 2019-05-03 LAB — GLUCOSE, CAPILLARY
Glucose-Capillary: 130 mg/dL — ABNORMAL HIGH (ref 70–99)
Glucose-Capillary: 92 mg/dL (ref 70–99)
Glucose-Capillary: 218 mg/dL — ABNORMAL HIGH (ref 70–99)
Glucose-Capillary: 108 mg/dL — ABNORMAL HIGH (ref 70–99)

## 2019-05-03 LAB — CULTURE, BLOOD (ROUTINE X 2)
Culture: NO GROWTH
Culture: NO GROWTH
Special Requests: ADEQUATE

## 2019-05-03 LAB — SARS CORONAVIRUS 2 (TAT 6-24 HRS): SARS Coronavirus 2: NEGATIVE

## 2019-05-03 MED ORDER — HEPARIN SODIUM (PORCINE) 1000 UNIT/ML IJ SOLN
INTRAMUSCULAR | Status: AC
  Filled 2019-05-03: qty 4

## 2019-05-03 NOTE — Progress Notes (Signed)
Patient more alert today, patient oriented x4 ambulatory with assistance to bedside toilet.

## 2019-05-03 NOTE — Progress Notes (Signed)
PT Cancellation Note  Patient Details Name: [name] Michael MRN: [id number] DOB: [birth year]   Cancelled Treatment:    Reason Eval/Treat Not Completed: Patient at procedure or test/unavailable. Pt currently off unit at HD. Will continue to follow and continue with PT POC as able.    Thelma Comp 05/03/2019, 8:59 AM   Rolinda Roan, PT, DPT Acute Rehabilitation Services Pager: [phone] Office: [phone]

## 2019-05-03 NOTE — TOC Progression Note (Addendum)
Transition of Care [facility]) - Progression Note    Patient Details  Name: [name] Michael MRN: [id number] Date of Birth: February 12, 1961  Transition of Care [facility]) CM/SW Contact  Jacalyn Lefevre Edson Snowball, RN Phone Number: 05/03/2019, 1:14 PM  Clinical Narrative:     Patient has chose [facility] . Called Rosario Adie and left message. Patient last covid was 04/28/19 will need another messaged MD.  Freda Munro at [facility] checking on room availability and transportation to hemodialysis [facility] TTS   Confirmed with Quintin Alto , hemodialysis arranged at [facility] located at [address] , [place]. TTSat , She needs to arrive at 1210 for 1230 sit time. If patient discharges 05/04/19 she can start hemodialysis at [facility] starting Thursday 05/05/19 , however on the first day she will have at arrive at 1130 am. Left voicemail for Freda Munro at [facility]   Expected Discharge Plan: [place] Barriers to Discharge: Insurance Authorization  Expected Discharge Plan and Services Expected Discharge Plan: [place] Choice: [place] arrangements for the past 2 months: [place]                                       Social Determinants of Health (SDOH) Interventions    Readmission Risk Interventions Readmission Risk Prevention Plan 04/29/2019  Transportation Screening Complete  PCP or Specialist Appt within 3-5 Days Complete  HRI or [place] Complete  Social Work Consult for [place] Planning/Counseling Complete  Palliative Care Screening Not Applicable  Medication Review [employer]) Referral to Pharmacy  Some recent data might be hidden

## 2019-05-03 NOTE — Progress Notes (Signed)
PROGRESS NOTE    [name] Michael  [id number] DOB: September 06, 1960 DOA: 04/28/2019 PCP: Charlott Rakes, MD     Brief Narrative:  [name] Michael is a 58 y.o. female with past medical history of end-stage renal disease in preparation for dialysis who came to outpatient surgery for scheduled tunneled hemodialysis catheter.  Patient has new left AV fistula placed on 8/31-second stage on 10/19 by Dr. Carlis Abbott. Patient was noted to be lethargic with saturation of 80% on room air.  She reports being sick for the last 2 to 3 days with generalized weakness, fatigue and cough.  Reports diffuse abdominal pain but denies any chest pains or shortness of breath.  The patient was started on oxygen 3 L nasal cannula with saturation going up to 94%.  She denies being in contact with anybody with COVID-19 and had a negative Covid test earlier this month. She was referred to the ED. she underwent tunneled dialysis catheter placement 11/6 and was started on dialysis.  New events last 24 hours / Subjective: Patient seen in dialysis unit.  No new complaints.  Assessment & Plan:   Active Problems:   AMS (altered mental status)   Hypoxemia   ESRD -Status post placement of AV fistula -TDC catheter placed 11/6  -Nephrology following for HD   Acute hypoxemic respiratory failure -Thought to be secondary to fluid overload from ESRD -Covid 19 negative  -Continue nasal cannula O2, 2L currently. Continue to wean as tolerated   Type 2 diabetes, well controlled -Ha1c 6.5  -SSI  Hyperlipidemia -Continue Lipitor  Hypertension -Continue Lopressor, amlodipine   Depression -Continue Prozac  HIV positive -HIV screening test obtained on this admission showed HIV positive.  ID has ordered for HIV RNA which resulted <20    DVT prophylaxis: Subq hep  Code Status: Full code Family Communication: None at bedside Disposition Plan: SNF placement pending.  Repeat Covid testing ordered   Consultants:   Nephrology   IR  Antimicrobials:  Anti-infectives (From admission, onward)   Start     Dose/Rate Route Frequency Ordered Stop   04/29/19 1601  ceFAZolin (ANCEF) IVPB 1 g/50 mL premix     over 30 Minutes Intravenous Continuous PRN 04/29/19 1601 04/29/19 1601   04/29/19 1545  ceFAZolin (ANCEF) 2-4 GM/100ML-% IVPB    Note to Pharmacy: Gar Ponto   : cabinet override      04/29/19 1545 04/30/19 0359       Objective: Vitals:   05/03/19 0930 05/03/19 1000 05/03/19 1030 05/03/19 1055  BP: (!) 149/66 (!) 126/44 (!) 130/56 (!) 155/70  Pulse: 67 68 71 73  Resp: 16 16 19 13   Temp:    98.2 F (36.8 C)  TempSrc:    Oral  SpO2: 97% 98% 98% 98%  Weight:    45.5 kg  Height:        Intake/Output Summary (Last 24 hours) at 05/03/2019 1317 Last data filed at 05/03/2019 1222 Gross per 24 hour  Intake 3 ml  Output 2000 ml  Net -1997 ml   Filed Weights   05/01/19 0356 05/03/19 0719 05/03/19 1055  Weight: 48.2 kg 48.2 kg 45.5 kg    Examination: General exam: Appears calm and comfortable  Respiratory system: Clear to auscultation. Respiratory effort normal. Cardiovascular system: S1 & S2 heard, RRR. No pedal edema. Gastrointestinal system: Abdomen is nondistended, soft and nontender. Normal bowel sounds heard. Central nervous system: Alert and oriented. Non focal exam. Speech clear  Extremities: Symmetric in appearance bilaterally  Skin:  No rashes, lesions or ulcers on exposed skin  Psychiatry: Judgement and insight appear stable. Mood & affect appropriate.     Data Reviewed: I have personally reviewed following labs and imaging studies  CBC: Recent Labs  Lab 04/28/19 0745 04/28/19 0942 04/29/19 0631 04/30/19 0412 05/01/19 0528  WBC 9.0 10.3 10.7* 10.8* 9.1  NEUTROABS  --  8.0*  --   --   --   HGB 8.7* 9.0* 8.1* 7.5* 8.0*  HCT 28.2* 29.0* 27.0* 24.6* 26.9*  MCV 89.5 89.5 93.4 92.1 93.7  PLT 391 445* 406* 333 0000000   Basic Metabolic Panel: Recent Labs  Lab 04/29/19 0631 04/30/19  0412 05/01/19 0528 05/02/19 0556 05/03/19 0518  NA 141 137 140 142 142  K 5.4* 4.1 3.6 4.0 3.7  CL 104 102 102 106 104  CO2 17* 22 27 26 26   GLUCOSE 102* 110* 110* 134* 135*  BUN 69* 38* 11 19 26*  CREATININE 5.62* 3.45* 1.69* 2.86* 3.43*  CALCIUM 8.7* 8.3* 8.3* 9.0 9.0  PHOS  --  4.3  --  4.1  --    GFR: Estimated Creatinine Clearance: 10.5 mL/min (A) (by C-G formula based on SCr of 3.43 mg/dL (H)). Liver Function Tests: Recent Labs  Lab 04/28/19 0942 04/30/19 0412 05/02/19 0556  AST 93*  --   --   ALT 54*  --   --   ALKPHOS 108  --   --   BILITOT 0.9  --   --   PROT 7.0  --   --   ALBUMIN 3.7 3.2* 3.0*   No results for input(s): LIPASE, AMYLASE in the last 168 hours. Recent Labs  Lab 04/28/19 0942  AMMONIA 24   Coagulation Profile: Recent Labs  Lab 04/28/19 0745  INR 1.4*   Cardiac Enzymes: No results for input(s): CKTOTAL, CKMB, CKMBINDEX, TROPONINI in the last 168 hours. BNP (last 3 results) No results for input(s): PROBNP in the last 8760 hours. HbA1C: No results for input(s): HGBA1C in the last 72 hours. CBG: Recent Labs  Lab 05/02/19 1124 05/02/19 1609 05/02/19 2200 05/03/19 0613 05/03/19 1132  GLUCAP 170* 127* 219* 130* 92   Lipid Profile: No results for input(s): CHOL, HDL, LDLCALC, TRIG, CHOLHDL, LDLDIRECT in the last 72 hours. Thyroid Function Tests: No results for input(s): TSH, T4TOTAL, FREET4, T3FREE, THYROIDAB in the last 72 hours. Anemia Panel: No results for input(s): VITAMINB12, FOLATE, FERRITIN, TIBC, IRON, RETICCTPCT in the last 72 hours. Sepsis Labs: Recent Labs  Lab 04/28/19 0942 04/28/19 1200  LATICACIDVEN 1.5 1.1    Recent Results (from the past 240 hour(s))  Blood culture (routine x 2)     Status: None   Collection Time: 04/28/19 10:17 AM   Specimen: BLOOD  Result Value Ref Range Status   Specimen Description BLOOD RIGHT ANTECUBITAL  Final   Special Requests   Final    BOTTLES DRAWN AEROBIC AND ANAEROBIC Blood  Culture adequate volume   Culture   Final    NO GROWTH 5 DAYS Performed at [facility], [address], [place], [place] [address]    Report Status 05/03/2019 FINAL  Final  SARS CORONAVIRUS 2 (TAT 6-24 HRS) Nasopharyngeal Nasopharyngeal Swab     Status: None   Collection Time: 04/28/19 10:51 AM   Specimen: Nasopharyngeal Swab  Result Value Ref Range Status   SARS Coronavirus 2 NEGATIVE NEGATIVE Final    Comment: (NOTE) SARS-CoV-2 target nucleic acids are NOT DETECTED. The SARS-CoV-2 RNA is generally detectable  in upper and lower respiratory specimens during the acute phase of infection. Negative results do not preclude SARS-CoV-2 infection, do not rule out co-infections with other pathogens, and should not be used as the sole basis for treatment or other patient management decisions. Negative results must be combined with clinical observations, patient history, and epidemiological information. The expected result is Negative. Fact Sheet for Patients: SugarRoll.be Fact Sheet for Healthcare Providers: https://www.woods-mathews.com/ This test is not yet approved or cleared by the [place] FDA and  has been authorized for detection and/or diagnosis of SARS-CoV-2 by FDA under an Emergency Use Authorization (EUA). This EUA will remain  in effect (meaning this test can be used) for the duration of the COVID-19 declaration under Section 56 4(b)(1) of the Act, 21 U.S.C. section 360bbb-3(b)(1), unless the authorization is terminated or revoked sooner. Performed at [facility], [place] [address], [place], [place] [address]   Urine culture     Status: Abnormal   Collection Time: 04/28/19  9:02 PM   Specimen: Urine, Clean Catch  Result Value Ref Range Status   Specimen Description URINE, CLEAN CATCH  Final   Special Requests   Final    NONE Performed at [facility], [place] [address] [place], [place] [address]    Culture  MULTIPLE SPECIES PRESENT, SUGGEST RECOLLECTION (A)  Final   Report Status 04/30/2019 FINAL  Final  MRSA PCR Screening     Status: Abnormal   Collection Time: 04/28/19  9:02 PM   Specimen: Nasal Mucosa; Nasopharyngeal  Result Value Ref Range Status   MRSA by PCR POSITIVE (A) NEGATIVE Final    Comment:        The GeneXpert MRSA Assay (FDA approved for NASAL specimens only), is one component of a comprehensive MRSA colonization surveillance program. It is not intended to diagnose MRSA infection nor to guide or monitor treatment for MRSA infections. RESULT CALLED TO, READ BACK BY AND VERIFIED WITH: Mart Piggs [id number] 04/29/2019 Mena Goes Performed at [facility], [place] [address] [place], [place] [address]   Blood culture (routine x 2)     Status: None   Collection Time: 04/28/19  9:44 PM   Specimen: BLOOD RIGHT ARM  Result Value Ref Range Status   Specimen Description BLOOD RIGHT ARM  Final   Special Requests   Final    BOTTLES DRAWN AEROBIC ONLY Blood Culture results may not be optimal due to an inadequate volume of blood received in culture bottles   Culture   Final    NO GROWTH 5 DAYS Performed at [facility], [place] [address] [place],  [address]    Report Status 05/03/2019 FINAL  Final      Radiology Studies: No results found.    Scheduled Meds: . amLODipine  10 mg Oral Daily  . aspirin EC  81 mg Oral Daily  . atorvastatin  40 mg Oral QPM  . Chlorhexidine Gluconate Cloth  6 each Topical Q0600  . darbepoetin (ARANESP) injection - DIALYSIS  40 mcg Intravenous Q Sat-HD  . famotidine  20 mg Oral Daily  . FLUoxetine  10 mg Oral Daily  . gabapentin  100 mg Oral BID  . heparin      . heparin  5,000 Units Subcutaneous Q8H  . insulin aspart  0-5 Units Subcutaneous QHS  . insulin aspart  0-9 Units Subcutaneous TID WC  . mouth rinse  15 mL Mouth Rinse BID  . metoprolol tartrate  100  mg Oral BID  . pantoprazole  40 mg Oral Daily  . polyethylene glycol   17 g Oral Daily  . sodium bicarbonate  1,300 mg Oral BID  . sodium chloride flush  3 mL Intravenous Q12H   Continuous Infusions: . sodium chloride       LOS: 5 days      Time spent: 20 minutes   [name] Phi, DO [facility] 05/03/2019, 1:17 PM   Available via Epic secure chat 7am-7pm After these hours, please refer to coverage provider listed on [email or web address]

## 2019-05-03 NOTE — Progress Notes (Addendum)
[facility] Progress Note   Subjective:   Seen on HD. Reports feeling better this AM. SOB improved but still with a dry cough. Denies orthopnea, CP, palpitations, dizziness, abdominal pain, N/V/D. Hoping to d/c to SNF.  Objective Vitals:   05/03/19 0800 05/03/19 0830 05/03/19 0900 05/03/19 0930  BP: (!) 113/46 (!) 110/49 131/69 (!) 149/66  Pulse: 66 67 67 67  Resp: 11 11 12 16   Temp:      TempSrc:      SpO2:      Weight:      Height:       Physical Exam General: Well developed, well nourished female. Alert and in NAD Heart: RRR, no murmurs, rubs or gallops Lungs: CTA bilaterally without wheezing, rhonchi or rales Abdomen: Soft, non-tender, non-distended. +BS Extremities: No edema b/l lower extremities Dialysis Access: R IJ TDC currently accessed, [name] AVF + bruit   Additional Objective Labs: Basic Metabolic Panel: Recent Labs  Lab 04/30/19 0412 05/01/19 0528 05/02/19 0556 05/03/19 0518  NA 137 140 142 142  K 4.1 3.6 4.0 3.7  CL 102 102 106 104  CO2 22 27 26 26   GLUCOSE 110* 110* 134* 135*  BUN 38* 11 19 26*  CREATININE 3.45* 1.69* 2.86* 3.43*  CALCIUM 8.3* 8.3* 9.0 9.0  PHOS 4.3  --  4.1  --    Liver Function Tests: Recent Labs  Lab 04/28/19 0942 04/30/19 0412 05/02/19 0556  AST 93*  --   --   ALT 54*  --   --   ALKPHOS 108  --   --   BILITOT 0.9  --   --   PROT 7.0  --   --   ALBUMIN 3.7 3.2* 3.0*   CBC: Recent Labs  Lab 04/28/19 0745 04/28/19 0942 04/29/19 0631 04/30/19 0412 05/01/19 0528  WBC 9.0 10.3 10.7* 10.8* 9.1  NEUTROABS  --  8.0*  --   --   --   HGB 8.7* 9.0* 8.1* 7.5* 8.0*  HCT 28.2* 29.0* 27.0* 24.6* 26.9*  MCV 89.5 89.5 93.4 92.1 93.7  PLT 391 445* 406* 333 333   Blood Culture    Component Value Date/Time   SDES BLOOD RIGHT ARM 04/28/2019 2144   SPECREQUEST  04/28/2019 2144    BOTTLES DRAWN AEROBIC ONLY Blood Culture results may not be optimal due to an inadequate volume of blood received in culture bottles    CULT  04/28/2019 2144    NO GROWTH 5 DAYS Performed at [facility], [place] [address] [place], [place] [address]    REPTSTATUS 05/03/2019 FINAL 04/28/2019 2144    CBG: Recent Labs  Lab 05/02/19 0646 05/02/19 1124 05/02/19 1609 05/02/19 2200 05/03/19 0613  GLUCAP 130* 170* 127* 219* 130*   Medications: . sodium chloride     . amLODipine  10 mg Oral Daily  . aspirin EC  81 mg Oral Daily  . atorvastatin  40 mg Oral QPM  . Chlorhexidine Gluconate Cloth  6 each Topical Q0600  . Chlorhexidine Gluconate Cloth  6 each Topical Q0600  . darbepoetin (ARANESP) injection - DIALYSIS  40 mcg Intravenous Q Sat-HD  . famotidine  20 mg Oral Daily  . FLUoxetine  10 mg Oral Daily  . gabapentin  100 mg Oral BID  . heparin  5,000 Units Subcutaneous Q8H  . insulin aspart  0-5 Units Subcutaneous QHS  . insulin aspart  0-9 Units Subcutaneous TID WC  . mouth rinse  15 mL Mouth Rinse  BID  . metoprolol tartrate  100 mg Oral BID  . mupirocin ointment  1 application Nasal BID  . pantoprazole  40 mg Oral Daily  . polyethylene glycol  17 g Oral Daily  . sodium bicarbonate  1,300 mg Oral BID  . sodium chloride flush  3 mL Intravenous Q12H    Dialysis Orders: New HD start, CLIP to [facility] TTS12:30 pm   Assessment/Plan: 1. ESRD: Progressive CKD V- HD indicated with uremic symptoms/volume excess after presenting to short stay for [facility]. s/p left 2nd stage BVT by Dr. Carlis Abbott on 04/11/19. R IJ TDC placed 11/6 by IR. 1st HDon11/6.S/p HD x 2. Tolerating UF so far. K+ 3.7. Plan next HD 11/12. 2. Hypertension/volume: BP stable. On amlodipine 10, metoprolol 100.Weights down 3.4kg since admission, edema resolved today. Continue to titrate volume down as tolerated.Keep SBP >100. Monitor. Continue to probe for EDW. 3. Anemia: Hgb 8.0IV Fe repleted. Tsat 41%. Received aranesp 60 on 11/7. Repeat CBC today pending.  4. Metabolic bone disease:Corrected calcium 9.8, last phos 4.1. No binders/VDRA  yet 5. Nutrition: Renal diet/ Fluid restriction 6. DM Type 2: insulin per primary 7. HIV positive: found on HIV screen this admission. HIV RNA < 20.   Anice Paganini, PA-C 05/03/2019, 10:17 AM  [facility] Pager: [phone]  Pt seen, examined and agree w A/P as above.  Kelly Splinter  MD 05/03/2019, 10:47 AM

## 2019-05-03 NOTE — Progress Notes (Signed)
Pt off unit to HD.

## 2019-05-04 LAB — BASIC METABOLIC PANEL
Anion gap: 12 (ref 5–15)
BUN: 11 mg/dL (ref 6–20)
CO2: 28 mmol/L (ref 22–32)
Calcium: 9 mg/dL (ref 8.9–10.3)
Chloride: 101 mmol/L (ref 98–111)
Creatinine, Ser: 2.25 mg/dL — ABNORMAL HIGH (ref 0.44–1.00)
GFR calc Af Amer: 27 mL/min — ABNORMAL LOW (ref >60)
GFR calc non Af Amer: 23 mL/min — ABNORMAL LOW (ref >60)
Glucose, Bld: 142 mg/dL — ABNORMAL HIGH (ref 70–99)
Potassium: 4.2 mmol/L (ref 3.5–5.1)
Sodium: 141 mmol/L (ref 135–145)

## 2019-05-04 LAB — GLUCOSE, CAPILLARY
Glucose-Capillary: 136 mg/dL — ABNORMAL HIGH (ref 70–99)
Glucose-Capillary: 197 mg/dL — ABNORMAL HIGH (ref 70–99)

## 2019-05-04 MED ORDER — OXYCODONE HCL 5 MG PO TABS: 5.0000 mg | ORAL_TABLET | ORAL | 0 refills | Status: DC | PRN

## 2019-05-04 MED ORDER — GABAPENTIN 100 MG PO CAPS: 100.0000 mg | ORAL_CAPSULE | Freq: Two times a day (BID) | ORAL | 0 refills | Status: DC

## 2019-05-04 NOTE — Progress Notes (Signed)
Occupational Therapy Treatment Patient Details Name: [name] Michael MRN: [id number] DOB: September 24, 1960 Today's Date: 05/04/2019    History of present illness [name] Michael is 58 y.o female, presenting to [facility] for 2nd stage basilic vein transposition L arm. PMH CKD DM, HLD, HTN, CVA, HIV, and TIA.    OT comments  Pt progressing towards acute OT goals. Pt was min guard for OOB ADLs this session. Remains with cognitive deficits impacting safety with OOB mobility/ADLs. D/c plan remains appropriate.    Follow Up Recommendations  SNF;Supervision/Assistance - 24 hour    Equipment Recommendations  (defer to next venue)    Recommendations for Other Services      Precautions / Restrictions Precautions Precautions: Fall Precaution Comments: Watch O2 Restrictions Weight Bearing Restrictions: No       Mobility Bed Mobility Overal bed mobility: Needs Assistance Bed Mobility: Supine to Sit     Supine to sit: Supervision     General bed mobility comments: up in chair  Transfers Overall transfer level: Needs assistance Equipment used: Rolling walker (2 wheeled) Transfers: Sit to/from Stand Sit to Stand: Supervision         General transfer comment: supervision for safety    Balance Overall balance assessment: Needs assistance Sitting-balance support: Feet supported;No upper extremity supported Sitting balance-Leahy Scale: Fair     Standing balance support: Single extremity supported;No upper extremity supported Standing balance-Leahy Scale: Fair Standing balance comment: Able to stand at the sink and wash hands and face without UE support.                            ADL either performed or assessed with clinical judgement   ADL Overall ADL's : Needs assistance/impaired     Grooming: Brushing hair;Supervision/safety;Standing Grooming Details (indicate cue type and reason): intermittent single external support needed                              Functional mobility during ADLs: Min guard;Rolling walker General ADL Comments: Completed household distance functional mobility navigating turns and obstacles at min guard level. Tendency to leave AD to the side as she approached seat. decreased awareness and slow processing impacting safety with mobility     Vision       Perception     Praxis      Cognition Arousal/Alertness: Awake/alert Behavior During Therapy: Flat affect Overall Cognitive Status: Impaired/Different from baseline Area of Impairment: Memory;Safety/judgement;Awareness;Problem solving                     Memory: Decreased short-term memory   Safety/Judgement: Decreased awareness of safety;Decreased awareness of deficits Awareness: Emergent Problem Solving: Slow processing;Decreased initiation;Difficulty sequencing;Requires verbal cues          Exercises     Shoulder Instructions       General Comments Pt on 2L O2 throughout session. Post-ambulation O2 sat 100.     Pertinent Vitals/ Pain       Pain Assessment: No/denies pain  Home Living                                          Prior Functioning/Environment              Frequency  Min 2X/week        Progress Toward  Goals  OT Goals(current goals can now be found in the care plan section)  Progress towards OT goals: Progressing toward goals  Acute Rehab OT Goals Patient Stated Goal: no goal stated OT Goal Formulation: Patient unable to participate in goal setting Time For Goal Achievement: 05/15/19 Potential to Achieve Goals: Fair ADL Goals Pt Will Perform Eating: Independently;sitting Pt Will Perform Grooming: Independently;standing Pt Will Perform Upper Body Dressing: Independently;sitting Pt Will Perform Lower Body Dressing: Independently;sit to/from stand Pt Will Transfer to Toilet: with modified independence;ambulating;regular height toilet Pt Will Perform Tub/Shower Transfer: Tub transfer;with  modified independence;3 in 1;rolling walker;ambulating  Plan Discharge plan remains appropriate    Co-evaluation                 AM-PAC OT "6 Clicks" Daily Activity     Outcome Measure   Help from another person eating meals?: None Help from another person taking care of personal grooming?: None Help from another person toileting, which includes using toliet, bedpan, or urinal?: A Lot Help from another person bathing (including washing, rinsing, drying)?: A Lot Help from another person to put on and taking off regular upper body clothing?: A Little Help from another person to put on and taking off regular lower body clothing?: A Little 6 Click Score: 18    End of Session Equipment Utilized During Treatment: Rolling walker;Oxygen  OT Visit Diagnosis: Unsteadiness on feet (R26.81);Muscle weakness (generalized) (M62.81)   Activity Tolerance Patient tolerated treatment well   Patient Left in chair;with call bell/phone within reach;with chair alarm set   Nurse Communication          Time: [phone] OT Time Calculation (min): 18 min  Charges: OT General Charges $OT Visit: 1 Visit OT Treatments $Self Care/Home Management : 8-22 mins  [name] Michael, OT Acute Rehabilitation Services Pager: [phone] Office: [phone]    [name] 05/04/2019, 12:27 PM

## 2019-05-04 NOTE — TOC Progression Note (Addendum)
Transition of Care [facility]) - Progression Note    Patient Details  Name: [name] Michael MRN: [id number] Date of Birth: Apr 10, 1961  Transition of Care [facility]) CM/SW Contact  Jacalyn Lefevre Edson Snowball, RN Phone Number: 05/04/2019, 10:05 AM  Clinical Narrative:     Darleen Crocker left message for Rosario Adie awaiting call back.  Confirmed with Rosario Adie at [facility] patient can be accepted today. Rm number [address] , number to call report is [phone] . Nurse aware. Patient aware and in agreement. Patient consented for NCM to call sister [name] and provide number number. Same done. [place] with hemodialysis aware. PTAR called for 1 pm  Expected Discharge Plan: [place] Barriers to Discharge: Insurance Authorization  Expected Discharge Plan and Services Expected Discharge Plan: [place] Choice: [place] arrangements for the past 2 months: [place] Expected Discharge Date: 05/04/19                                     Social Determinants of Health (SDOH) Interventions    Readmission Risk Interventions Readmission Risk Prevention Plan 04/29/2019  Transportation Screening Complete  PCP or Specialist Appt within 3-5 Days Complete  HRI or [place] Complete  Social Work Consult for [place] Planning/Counseling Complete  Palliative Care Screening Not Applicable  Medication Review [employer]) Referral to Pharmacy  Some recent data might be hidden

## 2019-05-04 NOTE — Progress Notes (Signed)
Called report to [place] at [facility].  Paulla Fore, RN, BSN

## 2019-05-04 NOTE — Discharge Summary (Addendum)
Physician Discharge Summary  [name] Michael [id number] DOB: 1956/10/18 DOA: 04/28/2019  PCP: Charlott Rakes, MD  Admit date: 04/28/2019  Discharge date: 05/04/2019  Admitted From:Home  Disposition:  SNF  Recommendations for Outpatient Follow-up:  1. Follow up with PCP in 1-2 weeks 2. Follow-up with ID Dr. Linus Salmons in 2 weeks for HIV positive test, low viral load noted 3. Continue on hemodialysis schedule TTS with next hemodialysis outpatient 05/05/2019 4. Continue medications as noted below  Home Health: None  Equipment/Devices: None  Discharge Condition: Stable  CODE STATUS: Full  Diet recommendation: Heart Healthy/carb modified  Brief/Interim Summary: [name] Michael is a 58 y.o.female with past medical history of end-stage renal disease in preparation for dialysis who came to outpatient surgery for scheduled tunneled hemodialysis catheter. Patient has new left AV fistula placed on 8/31-second stage on 10/19 by Dr. Carlis Abbott. Patient was noted to be lethargic with saturation of 80% on room air. She reports being sick for the last 2 to 3 days with generalized weakness, fatigue and cough. Reports diffuse abdominal pain but denies any chest pains or shortness of breath. The patient was started on oxygen 3 L nasal cannula with saturation going up to 94%. She denies being in contact with anybody with COVID-19 and had a negative Covid test earlier this month. She was referred to the ED. she underwent tunneled dialysis catheter placement 11/6 and was started on dialysis.  She has done well with dialysis and had her last session on 11/10.  She is overall doing quite well and is noted to have positive HIV screen during this admission, but HIV RNA is less than 20.  Recommended follow-up with ID regarding this finding.  She has had negative coronavirus testing noted and she is noted to be stable for discharge today to SNF with hemodialysis at that facility.  She is currently on room air and has  no further shortness of breath or other symptoms noted.  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  ID has ordered for HIV RNA which resulted <20  -Follow-up with Dr. Linus Salmons outpatient.  Discharge Diagnoses:  Active Problems:   AMS (altered mental status)   Hypoxemia  Principal discharge diagnosis: Acute hypoxemic respiratory failure secondary to fluid overload from ESRD status post hemodialysis with [facility] catheter 11/6.  Discharge Instructions  Discharge Instructions    Diet - low sodium heart healthy   Complete by: As directed    Increase activity slowly   Complete by: As directed      Allergies as of 05/04/2019      Reactions   Hydrocodone Nausea And Vomiting   Listed in Epic, but not on the [facility]      Medication List    TAKE these medications   acetaminophen 325 MG tablet Commonly known as: TYLENOL Take 650 mg by mouth every 4 (four) hours as needed for mild pain.   amLODipine 10 MG tablet Commonly known as: NORVASC Take 10 mg by mouth daily.   aspirin 81 MG chewable tablet Chew 81 mg by mouth daily.   atorvastatin 40 MG tablet  Commonly known as: LIPITOR Take 40 mg by mouth at bedtime.   Banophen 25 mg capsule Generic drug: diphenhydrAMINE Take 25 mg by mouth every 4 (four) hours as needed for itching or allergies.   CertaVite Senior/Antioxidant Tabs Take 1 tablet by mouth daily.   famotidine 10 MG tablet Commonly known as: PEPCID Take 10 mg by mouth daily.   FLUoxetine  10 MG capsule Commonly known as: PROZAC Take 10 mg by mouth daily.   gabapentin 100 MG capsule Commonly known as: NEURONTIN Take 1 capsule (100 mg total) by mouth 2 (two) times daily. What changed: when to take this   GaviLAX 17 GM/SCOOP powder Generic drug: polyethylene glycol powder Take 17 g by mouth See admin instructions. Mix 17 grams of powder into 8 ounces of fluid, stir, and drink (by mouth) once a day   insulin lispro 100 UNIT/ML injection Commonly known as: HUMALOG Inject 8 Units into the skin See admin instructions. Inject 8 units into the skin three times a day with meals and hold if BGL <150 or if patient is not eating   metoprolol tartrate 100 MG tablet Commonly known as: LOPRESSOR Take 100 mg by mouth 2 (two) times daily.   omeprazole 40 MG capsule Commonly known as: PRILOSEC Take 40 mg by mouth daily.   oxyCODONE 5 MG immediate release tablet Commonly known as: Roxicodone Take 1 tablet (5 mg total) by mouth every 4 (four) hours as needed for moderate pain. What changed:   when to take this  reasons to take this   SACCHAROMYCES BOULARDII PO Take 1 capsule by mouth 2 (two) times daily.   senna-docusate 8.6-50 MG tablet Commonly known as: Senokot-S Take 1 tablet by mouth at bedtime as needed for mild constipation.   sodium bicarbonate 650 MG tablet Take 1,300 mg by mouth 2 (two) times daily.   Vitamin D (Ergocalciferol) 1.25 MG (50000 UT) Caps capsule Commonly known as: DRISDOL Take 1 capsule (50,000 Units total) by mouth every 7 (seven) days. What changed: when to take this       Contact information for follow-up providers    Charlott Rakes, MD Follow up in 1 week(s).   Specialty: Family Medicine Contact information: [address] [place] [address] [phone]        Thayer Headings, MD Follow up in 2 week(s).   Specialty: Infectious Diseases Contact information: [address]  [address] [phone]            Contact information for after-discharge care    [facility] .   Service: Skilled Nursing Contact information: [place] [address] [phone]                 Allergies  Allergen Reactions  . Hydrocodone Nausea And Vomiting    Listed in Epic, but not on the [facility]    Consultations:  Nephrology  IR   Procedures/Studies: Ct Abdomen Pelvis Wo Contrast  Result Date: 04/28/2019 CLINICAL DATA:  58 year old female with abdominal pain, nausea and vomiting. Chronic kidney disease. EXAM: CT ABDOMEN AND PELVIS WITHOUT CONTRAST TECHNIQUE: Multidetector CT imaging of the abdomen and pelvis was performed following the standard protocol without IV contrast. COMPARISON:  11/09/2016 CT and prior studies FINDINGS: Please note that parenchymal abnormalities may be missed without intravenous contrast. Lower chest: A small to moderate RIGHT pleural effusion with RIGHT basilar atelectasis identified. Hepatobiliary: No definite hepatic or gallbladder abnormalities noted.  No biliary dilatation. Pancreas: No acute abnormalities. Calcifications along the unscented process are unchanged. Spleen: Unremarkable Adrenals/Urinary Tract: No significant changes with 3 punctate nonobstructing LEFT renal calculi and mild perinephric stranding. No evidence of hydronephrosis or obstructing urinary calculi identified. The adrenal glands and bladder are unremarkable. Stomach/Bowel: Stomach is within normal limits. Appendix appears normal. No evidence of bowel wall thickening, distention, or inflammatory changes. Vascular/Lymphatic: Aortic atherosclerosis. No enlarged abdominal or pelvic lymph nodes. Reproductive: Uterus and bilateral adnexa are unremarkable. Other: Small amount of ascites within the abdomen and pelvis identified. Diffuse subcutaneous edema is present. No focal collection or pneumoperitoneum. Musculoskeletal: No acute  or suspicious bony abnormalities. IMPRESSION: 1. Small amount of ascites, diffuse subcutaneous edema and small to moderate RIGHT pleural effusion. 2. Nonobstructing LEFT renal calculi 3. Aortic Atherosclerosis (ICD10-I70.0). Electronically Signed   By: Margarette Canada M.D.   On: 04/28/2019 14:12   Ct Head Wo Contrast  Result Date: 04/28/2019 CLINICAL DATA:  59 year old female with altered mental status. EXAM: CT HEAD WITHOUT CONTRAST TECHNIQUE: Contiguous axial images were obtained from the base of the skull through the vertex without intravenous contrast. COMPARISON:  03/10/2018 FINDINGS: Brain: No evidence of acute infarction, hemorrhage, hydrocephalus, extra-axial collection or mass lesion/mass effect. Chronic small-vessel white matter ischemic changes and remote bilateral basal ganglia infarcts again noted. Vascular: Carotid and vertebral atherosclerotic calcifications again noted. Skull: Normal. Negative for fracture or focal lesion. Sinuses/Orbits: No acute finding. Other: None. IMPRESSION: 1. No evidence of acute intracranial abnormality. 2. Chronic small-vessel white matter ischemic changes and remote bilateral basal ganglia infarcts. Electronically Signed   By: Margarette Canada M.D.   On: 04/28/2019 14:03   Nm Pulmonary Perfusion  Result Date: 04/28/2019 CLINICAL DATA:  Elevated D-dimer.  Concern for pulmonary embolism. EXAM: NUCLEAR MEDICINE PERFUSION LUNG SCAN TECHNIQUE: Perfusion images were obtained in multiple projections after intravenous injection of radiopharmaceutical. RADIOPHARMACEUTICALS:  1.45 mCi Tc-61m MAA COMPARISON:  Chest radiograph FINDINGS: No wedge-shaped peripheral perfusion defects to suggest acute pulmonary embolism. IMPRESSION: No evidence of acute pulmonary embolism. Electronically Signed   By: Suzy Bouchard M.D.   On: 04/28/2019 16:16   Ir Fluoro Guide Cv Line Right  Result Date: 04/29/2019 CLINICAL DATA:  Renal failure and need for tunneled hemodialysis catheter. EXAM:  TUNNELED CENTRAL VENOUS HEMODIALYSIS CATHETER PLACEMENT WITH ULTRASOUND AND FLUOROSCOPIC GUIDANCE ANESTHESIA/SEDATION: No conscious sedation was administered. MEDICATIONS: 2 g IV Ancef. FLUOROSCOPY TIME:  48 seconds.  2.0 mGy. PROCEDURE: The procedure, risks, benefits, and alternatives were explained to the patient. Questions regarding the procedure were encouraged and answered. The patient understands and consents to the procedure. A timeout was performed prior to initiating the procedure. The right neck and chest were prepped with chlorhexidine in a sterile fashion, and a sterile drape was applied covering the operative field. Maximum barrier sterile technique with sterile gowns and gloves were used for the procedure. Local anesthesia was provided with 1% lidocaine. Ultrasound was used to confirm patency of the right internal jugular vein. After creating a small venotomy incision, a 21 gauge needle was advanced into the right internal jugular vein under direct, real-time ultrasound guidance. Ultrasound image documentation was performed. After securing guidewire access, an 8 Fr dilator was placed. A J-wire was kinked to measure appropriate catheter length. A Palindrome tunneled hemodialysis catheter measuring 19 cm from tip to cuff was chosen for placement. This was tunneled in a retrograde fashion from the chest wall to the venotomy incision. At the venotomy, serial dilatation was performed and a 16 Fr  peel-away sheath was placed over a guidewire. The catheter was then placed through the sheath and the sheath removed. Final catheter positioning was confirmed and documented with a fluoroscopic spot image. The catheter was aspirated, flushed with saline, and injected with appropriate volume heparin dwells. The venotomy incision was closed with subcutaneous 3-0 Monocryl and subcuticular 4-0 Vicryl. Dermabond was applied to the incision. The catheter exit site was secured with 0-Prolene retention sutures.  COMPLICATIONS: None.  No pneumothorax. FINDINGS: After catheter placement, the tip lies in the right atrium. The catheter aspirates normally and is ready for immediate use. IMPRESSION: Placement of tunneled hemodialysis catheter via the right internal jugular vein. The catheter tip lies in the right atrium. The catheter is ready for immediate use. Electronically Signed   By: Aletta Edouard M.D.   On: 04/29/2019 16:57   Ir US Guide Vasc Access Right  Result Date: 04/29/2019 CLINICAL DATA:  Renal failure and need for tunneled hemodialysis catheter. EXAM: TUNNELED CENTRAL VENOUS HEMODIALYSIS CATHETER PLACEMENT WITH ULTRASOUND AND FLUOROSCOPIC GUIDANCE ANESTHESIA/SEDATION: No conscious sedation was administered. MEDICATIONS: 2 g IV Ancef. FLUOROSCOPY TIME:  48 seconds.  2.0 mGy. PROCEDURE: The procedure, risks, benefits, and alternatives were explained to the patient. Questions regarding the procedure were encouraged and answered. The patient understands and consents to the procedure. A timeout was performed prior to initiating the procedure. The right neck and chest were prepped with chlorhexidine in a sterile fashion, and a sterile drape was applied covering the operative field. Maximum barrier sterile technique with sterile gowns and gloves were used for the procedure. Local anesthesia was provided with 1% lidocaine. Ultrasound was used to confirm patency of the right internal jugular vein. After creating a small venotomy incision, a 21 gauge needle was advanced into the right internal jugular vein under direct, real-time ultrasound guidance. Ultrasound image documentation was performed. After securing guidewire access, an 8 Fr dilator was placed. A J-wire was kinked to measure appropriate catheter length. A Palindrome tunneled hemodialysis catheter measuring 19 cm from tip to cuff was chosen for placement. This was tunneled in a retrograde fashion from the chest wall to the venotomy incision. At the venotomy,  serial dilatation was performed and a 16 Fr peel-away sheath was placed over a guidewire. The catheter was then placed through the sheath and the sheath removed. Final catheter positioning was confirmed and documented with a fluoroscopic spot image. The catheter was aspirated, flushed with saline, and injected with appropriate volume heparin dwells. The venotomy incision was closed with subcutaneous 3-0 Monocryl and subcuticular 4-0 Vicryl. Dermabond was applied to the incision. The catheter exit site was secured with 0-Prolene retention sutures. COMPLICATIONS: None.  No pneumothorax. FINDINGS: After catheter placement, the tip lies in the right atrium. The catheter aspirates normally and is ready for immediate use. IMPRESSION: Placement of tunneled hemodialysis catheter via the right internal jugular vein. The catheter tip lies in the right atrium. The catheter is ready for immediate use. Electronically Signed   By: Aletta Edouard M.D.   On: 04/29/2019 16:57   Dg Chest Portable 1 View  Result Date: 04/28/2019 CLINICAL DATA:  Altered mental status and lethargy. Shortness of breath. EXAM: PORTABLE CHEST 1 VIEW COMPARISON:  June 10, 2018 FINDINGS: There is a small right pleural effusion with right base atelectasis. There is also mild atelectasis in the left mid lung. The lungs elsewhere are clear. There is cardiomegaly with pulmonary vascularity normal. No adenopathy. No bone lesions. IMPRESSION: Small right pleural effusion with right base atelectasis.  There is left midlung atelectasis. There is no frank edema or consolidation. Heart mildly enlarged, stable. Pulmonary vascularity within normal limits. No evident adenopathy. Electronically Signed   By: Lowella Grip III M.D.   On: 04/28/2019 10:19   Dg Abd Portable 2 Views  Result Date: 04/28/2019 CLINICAL DATA:  Distended abdomen, abdominal pain. EXAM: PORTABLE ABDOMEN - 2 VIEW COMPARISON:  Chest x-ray of 11/08/2016, x-ray also of 06/10/2018  FINDINGS: Heart size is enlarged and there is a right-sided pleural effusion. Chest is incompletely imaged. No free air beneath either right or left hemidiaphragm on today's exam. Limited assessment due to patient body habitus. Bowel gas pattern without signs of obstruction. Paucity of gas noted in the central abdomen. No acute bone finding. IMPRESSION: 1. Right pleural effusion 2. No no signs of free air or bowel obstruction. Electronically Signed   By: Zetta Bills M.D.   On: 04/28/2019 10:23     Discharge Exam: Vitals:   05/03/19 1938 05/04/19 0608  BP: (!) 133/58 (!) 148/64  Pulse: 69 73  Resp: 17 17  Temp: 98.9 F (37.2 C) 99.2 F (37.3 C)  SpO2: 92% 93%   Vitals:   05/03/19 1358 05/03/19 1500 05/03/19 1938 05/04/19 0608  BP:  140/61 (!) 133/58 (!) 148/64  Pulse:  71 69 73  Resp:  16 17 17   Temp:  99 F (37.2 C) 98.9 F (37.2 C) 99.2 F (37.3 C)  TempSrc:  Oral Oral Oral  SpO2: 94% 95% 92% 93%  Weight:      Height:        General: Pt is alert, awake, not in acute distress Cardiovascular: RRR, S1/S2 +, no rubs, no gallops Respiratory: CTA bilaterally, no wheezing, no rhonchi Abdominal: Soft, NT, ND, bowel sounds + Extremities: no edema, no cyanosis    The results of significant diagnostics from this hospitalization (including imaging, microbiology, ancillary and laboratory) are listed below for reference.     Microbiology: Recent Results (from the past 240 hour(s))  Blood culture (routine x 2)     Status: None   Collection Time: 04/28/19 10:17 AM   Specimen: BLOOD  Result Value Ref Range Status   Specimen Description BLOOD RIGHT ANTECUBITAL  Final   Special Requests   Final    BOTTLES DRAWN AEROBIC AND ANAEROBIC Blood Culture adequate volume   Culture   Final    NO GROWTH 5 DAYS Performed at [facility], [address] [place], [place] [address]    Report Status 05/03/2019 FINAL  Final  SARS CORONAVIRUS 2 (TAT 6-24 HRS) Nasopharyngeal  Nasopharyngeal Swab     Status: None   Collection Time: 04/28/19 10:51 AM   Specimen: Nasopharyngeal Swab  Result Value Ref Range Status   SARS Coronavirus 2 NEGATIVE NEGATIVE Final    Comment: (NOTE) SARS-CoV-2 target nucleic acids are NOT DETECTED. The SARS-CoV-2 RNA is generally detectable in upper and lower respiratory specimens during the acute phase of infection. Negative results do not preclude SARS-CoV-2 infection, do not rule out co-infections with other pathogens, and should not be used as the sole basis for treatment or other patient management decisions. Negative results must be combined with clinical observations, patient history, and epidemiological information. The expected result is Negative. Fact Sheet for Patients: SugarRoll.be Fact Sheet for Healthcare Providers: https://www.woods-mathews.com/ This test is not yet approved or cleared by the [place] FDA and  has been authorized for detection and/or diagnosis of SARS-CoV-2 by FDA under an Emergency Use Authorization (EUA).  This EUA will remain  in effect (meaning this test can be used) for the duration of the COVID-19 declaration under Section 56 4(b)(1) of the Act, 21 U.S.C. section 360bbb-3(b)(1), unless the authorization is terminated or revoked sooner. Performed at [facility], [place] [address] [place], [place] [address]   Urine culture     Status: Abnormal   Collection Time: 04/28/19  9:02 PM   Specimen: Urine, Clean Catch  Result Value Ref Range Status   Specimen Description URINE, CLEAN CATCH  Final   Special Requests   Final    NONE Performed at [facility], [place] [address] [place], [place] [address]    Culture MULTIPLE SPECIES PRESENT, SUGGEST RECOLLECTION (A)  Final   Report Status 04/30/2019 FINAL  Final  MRSA PCR Screening     Status: Abnormal   Collection Time: 04/28/19  9:02 PM   Specimen: Nasal Mucosa; Nasopharyngeal  Result Value  Ref Range Status   MRSA by PCR POSITIVE (A) NEGATIVE Final    Comment:        The GeneXpert MRSA Assay (FDA approved for NASAL specimens only), is one component of a comprehensive MRSA colonization surveillance program. It is not intended to diagnose MRSA infection nor to guide or monitor treatment for MRSA infections. RESULT CALLED TO, READ BACK BY AND VERIFIED WITH: Mart Piggs [id number] 04/29/2019 Mena Goes Performed at [facility], [place] [address], [place], [place] [address]   Blood culture (routine x 2)     Status: None   Collection Time: 04/28/19  9:44 PM   Specimen: BLOOD RIGHT ARM  Result Value Ref Range Status   Specimen Description BLOOD RIGHT ARM  Final   Special Requests   Final    BOTTLES DRAWN AEROBIC ONLY Blood Culture results may not be optimal due to an inadequate volume of blood received in culture bottles   Culture   Final    NO GROWTH 5 DAYS Performed at [facility], [place] [address], [place], [place] [address]    Report Status 05/03/2019 FINAL  Final  SARS CORONAVIRUS 2 (TAT 6-24 HRS) Nasopharyngeal Nasopharyngeal Swab     Status: None   Collection Time: 05/03/19  2:28 PM   Specimen: Nasopharyngeal Swab  Result Value Ref Range Status   SARS Coronavirus 2 NEGATIVE NEGATIVE Final    Comment: (NOTE) SARS-CoV-2 target nucleic acids are NOT DETECTED. The SARS-CoV-2 RNA is generally detectable in upper and lower respiratory specimens during the acute phase of infection. Negative results do not preclude SARS-CoV-2 infection, do not rule out co-infections with other pathogens, and should not be used as the sole basis for treatment or other patient management decisions. Negative results must be combined with clinical observations, patient history, and epidemiological information. The expected result is Negative. Fact Sheet for Patients: SugarRoll.be Fact Sheet for Healthcare  Providers: https://www.woods-mathews.com/ This test is not yet approved or cleared by the [place] FDA and  has been authorized for detection and/or diagnosis of SARS-CoV-2 by FDA under an Emergency Use Authorization (EUA). This EUA will remain  in effect (meaning this test can be used) for the duration of the COVID-19 declaration under Section 56 4(b)(1) of the Act, 21 U.S.C. section 360bbb-3(b)(1), unless the authorization is terminated or revoked sooner. Performed at [facility], [place] [address] [place], [place] [address]      Labs: BNP (last 3 results) No results for input(s): BNP in the last 8760 hours. Basic Metabolic  Panel: Recent Labs  Lab 04/30/19 0412 05/01/19 0528 05/02/19 0556 05/03/19 0518 05/04/19 0518  NA 137 140 142 142 141  K 4.1 3.6 4.0 3.7 4.2  CL 102 102 106 104 101  CO2 22 27 26 26 28   GLUCOSE 110* 110* 134* 135* 142*  BUN 38* 11 19 26* 11  CREATININE 3.45* 1.69* 2.86* 3.43* 2.25*  CALCIUM 8.3* 8.3* 9.0 9.0 9.0  PHOS 4.3  --  4.1  --   --    Liver Function Tests: Recent Labs  Lab 04/28/19 0942 04/30/19 0412 05/02/19 0556  AST 93*  --   --   ALT 54*  --   --   ALKPHOS 108  --   --   BILITOT 0.9  --   --   PROT 7.0  --   --   ALBUMIN 3.7 3.2* 3.0*   No results for input(s): LIPASE, AMYLASE in the last 168 hours. Recent Labs  Lab 04/28/19 0942  AMMONIA 24   CBC: Recent Labs  Lab 04/28/19 0745 04/28/19 0942 04/29/19 0631 04/30/19 0412 05/01/19 0528  WBC 9.0 10.3 10.7* 10.8* 9.1  NEUTROABS  --  8.0*  --   --   --   HGB 8.7* 9.0* 8.1* 7.5* 8.0*  HCT 28.2* 29.0* 27.0* 24.6* 26.9*  MCV 89.5 89.5 93.4 92.1 93.7  PLT 391 445* 406* 333 333   Cardiac Enzymes: No results for input(s): CKTOTAL, CKMB, CKMBINDEX, TROPONINI in the last 168 hours. BNP: Invalid input(s): POCBNP CBG: Recent Labs  Lab 05/03/19 0613 05/03/19 1132 05/03/19 1624 05/03/19 2151 05/04/19 0606  GLUCAP 130* 92 218* 108* 136*    D-Dimer No results for input(s): DDIMER in the last 72 hours. Hgb A1c No results for input(s): HGBA1C in the last 72 hours. Lipid Profile No results for input(s): CHOL, HDL, LDLCALC, TRIG, CHOLHDL, LDLDIRECT in the last 72 hours. Thyroid function studies No results for input(s): TSH, T4TOTAL, T3FREE, THYROIDAB in the last 72 hours.  Invalid input(s): FREET3 Anemia work up No results for input(s): VITAMINB12, FOLATE, FERRITIN, TIBC, IRON, RETICCTPCT in the last 72 hours. Urinalysis    Component Value Date/Time   COLORURINE YELLOW 04/28/2019 2102   APPEARANCEUR CLEAR 04/28/2019 2102   LABSPEC 1.010 04/28/2019 2102   PHURINE 7.0 04/28/2019 2102   GLUCOSEU NEGATIVE 04/28/2019 2102   HGBUR NEGATIVE 04/28/2019 2102   BILIRUBINUR NEGATIVE 04/28/2019 2102   BILIRUBINUR n 05/25/2012 1202   KETONESUR NEGATIVE 04/28/2019 2102   PROTEINUR 100 (A) 04/28/2019 2102   UROBILINOGEN 0.2 05/25/2012 1202   UROBILINOGEN 0.2 03/29/2011 0131   NITRITE NEGATIVE 04/28/2019 2102   LEUKOCYTESUR NEGATIVE 04/28/2019 2102   Sepsis Labs Invalid input(s): PROCALCITONIN,  WBC,  [facility] Recent Results (from the past 240 hour(s))  Blood culture (routine x 2)     Status: None   Collection Time: 04/28/19 10:17 AM   Specimen: BLOOD  Result Value Ref Range Status   Specimen Description BLOOD RIGHT ANTECUBITAL  Final   Special Requests   Final    BOTTLES DRAWN AEROBIC AND ANAEROBIC Blood Culture adequate volume   Culture   Final    NO GROWTH 5 DAYS Performed at [facility], [address], [place], [place] [address]    Report Status 05/03/2019 FINAL  Final  SARS CORONAVIRUS 2 (TAT 6-24 HRS) Nasopharyngeal Nasopharyngeal Swab     Status: None   Collection Time: 04/28/19 10:51 AM   Specimen: Nasopharyngeal Swab  Result Value Ref Range Status  SARS Coronavirus 2 NEGATIVE NEGATIVE Final    Comment: (NOTE) SARS-CoV-2 target nucleic acids are NOT DETECTED. The SARS-CoV-2 RNA is  generally detectable in upper and lower respiratory specimens during the acute phase of infection. Negative results do not preclude SARS-CoV-2 infection, do not rule out co-infections with other pathogens, and should not be used as the sole basis for treatment or other patient management decisions. Negative results must be combined with clinical observations, patient history, and epidemiological information. The expected result is Negative. Fact Sheet for Patients: SugarRoll.be Fact Sheet for Healthcare Providers: https://www.woods-mathews.com/ This test is not yet approved or cleared by the [place] FDA and  has been authorized for detection and/or diagnosis of SARS-CoV-2 by FDA under an Emergency Use Authorization (EUA). This EUA will remain  in effect (meaning this test can be used) for the duration of the COVID-19 declaration under Section 56 4(b)(1) of the Act, 21 U.S.C. section 360bbb-3(b)(1), unless the authorization is terminated or revoked sooner. Performed at [facility], [place] [address], [place], [place] [address]   Urine culture     Status: Abnormal   Collection Time: 04/28/19  9:02 PM   Specimen: Urine, Clean Catch  Result Value Ref Range Status   Specimen Description URINE, CLEAN CATCH  Final   Special Requests   Final    NONE Performed at [facility], [place] [address] [place], [place] [address]    Culture MULTIPLE SPECIES PRESENT, SUGGEST RECOLLECTION (A)  Final   Report Status 04/30/2019 FINAL  Final  MRSA PCR Screening     Status: Abnormal   Collection Time: 04/28/19  9:02 PM   Specimen: Nasal Mucosa; Nasopharyngeal  Result Value Ref Range Status   MRSA by PCR POSITIVE (A) NEGATIVE Final    Comment:        The GeneXpert MRSA Assay (FDA approved for NASAL specimens only), is one component of a comprehensive MRSA colonization surveillance program. It is not intended to diagnose MRSA infection nor to  guide or monitor treatment for MRSA infections. RESULT CALLED TO, READ BACK BY AND VERIFIED WITH: Mart Piggs [id number] 04/29/2019 Mena Goes Performed at [facility], [place] [address], [place], [place] [address]   Blood culture (routine x 2)     Status: None   Collection Time: 04/28/19  9:44 PM   Specimen: BLOOD RIGHT ARM  Result Value Ref Range Status   Specimen Description BLOOD RIGHT ARM  Final   Special Requests   Final    BOTTLES DRAWN AEROBIC ONLY Blood Culture results may not be optimal due to an inadequate volume of blood received in culture bottles   Culture   Final    NO GROWTH 5 DAYS Performed at [facility], [place] [address], [place], [place] [address]    Report Status 05/03/2019 FINAL  Final  SARS CORONAVIRUS 2 (TAT 6-24 HRS) Nasopharyngeal Nasopharyngeal Swab     Status: None   Collection Time: 05/03/19  2:28 PM   Specimen: Nasopharyngeal Swab  Result Value Ref Range Status   SARS Coronavirus 2 NEGATIVE NEGATIVE Final    Comment: (NOTE) SARS-CoV-2 target nucleic acids are NOT DETECTED. The SARS-CoV-2 RNA is generally detectable in upper and lower respiratory specimens during the acute phase of infection. Negative results do not preclude SARS-CoV-2 infection, do not rule out co-infections with other pathogens, and should not be used as the sole basis for treatment or other patient management decisions. Negative results must be combined with clinical  observations, patient history, and epidemiological information. The expected result is Negative. Fact Sheet for Patients: SugarRoll.be Fact Sheet for Healthcare Providers: https://www.woods-mathews.com/ This test is not yet approved or cleared by the [place] FDA and  has been authorized for detection and/or diagnosis of SARS-CoV-2 by FDA under an Emergency Use Authorization (EUA). This EUA will remain  in effect (meaning this test can be used) for the duration of  the COVID-19 declaration under Section 56 4(b)(1) of the Act, 21 U.S.C. section 360bbb-3(b)(1), unless the authorization is terminated or revoked sooner. Performed at [facility], [place] [address] [place], [place] [address]      Time coordinating discharge: 35 minutes  SIGNED:   Rodena Goldmann, DO [facility] 05/04/2019, 8:51 AM  If 7PM-7AM, please contact night-coverage [email or web address]

## 2019-05-04 NOTE — Progress Notes (Signed)
Renal Navigator notes discharge order and summary. Renal Navigator notified [facility] of patient's plan for discharge and start in clinic tomorrow, 05/05/19. Navigator has requested orders be sent to clinic by Renal PA.  Alphonzo Cruise, [facility] Renal Navigator [phone]

## 2019-05-04 NOTE — Progress Notes (Signed)
DISCHARGE NOTE SN[facility] to be discharged Skilled nursing facility per MD order. Patient verbalized understanding.  Skin clean, dry and intact without evidence of skin break down, no evidence of skin tears noted. IV catheter discontinued intact. Site without signs and symptoms of complications. Dressing and pressure applied. Pt denies pain at the site currently. No complaints noted.  Patient free of lines, drains, and wounds. Pt has RIGHT IJ HD Cath in place.   Discharge packet assembled. An After Visit Summary (AVS) was printed and given to the EMS personnel. Patient escorted via stretcher and discharged to [facility] via ambulance. Report called to accepting facility; all questions and concerns addressed.   Paulla Fore, RN

## 2019-05-04 NOTE — Progress Notes (Signed)
[name] Michael Progress Note   Subjective:   Patient seen in room. Reports feeling much better since starting dialysis. Discharging to SNF for acute rehab today, will continue outpatient HD at [facility] on TTS. Denies SOB, dyspnea, cough, CP, palpitations, abdominal pain, N/V/D.  Objective Vitals:   05/03/19 1500 05/03/19 1938 05/04/19 0608 05/04/19 1222  BP: 140/61 (!) 133/58 (!) 148/64 (!) 147/61  Pulse: 71 69 73 71  Resp: 16 17 17 18   Temp: 99 F (37.2 C) 98.9 F (37.2 C) 99.2 F (37.3 C) 98.2 F (36.8 C)  TempSrc: Oral Oral Oral Oral  SpO2: 95% 92% 93% 94%  Weight:      Height:       Physical Exam General: Well developed, well nourished female. Alert and in NAD Heart: RRR, no murmurs, rubs or gallops Lungs: CTA bilaterally without wheezing, rhonchi or rales Abdomen: Soft, non-tender, non-distended. +BS Extremities: No edema b/l lower extremities Dialysis Access: R IJ TDC without surrounding erythema/drainage, [name] AVF + bruit  Additional Objective Labs: Basic Metabolic Panel: Recent Labs  Lab 04/30/19 0412  05/02/19 0556 05/03/19 0518 05/04/19 0518  NA 137   < > 142 142 141  K 4.1   < > 4.0 3.7 4.2  CL 102   < > 106 104 101  CO2 22   < > 26 26 28   GLUCOSE 110*   < > 134* 135* 142*  BUN 38*   < > 19 26* 11  CREATININE 3.45*   < > 2.86* 3.43* 2.25*  CALCIUM 8.3*   < > 9.0 9.0 9.0  PHOS 4.3  --  4.1  --   --    < > = values in this interval not displayed.   Liver Function Tests: Recent Labs  Lab 04/28/19 0942 04/30/19 0412 05/02/19 0556  AST 93*  --   --   ALT 54*  --   --   ALKPHOS 108  --   --   BILITOT 0.9  --   --   PROT 7.0  --   --   ALBUMIN 3.7 3.2* 3.0*   CBC: Recent Labs  Lab 04/28/19 0745 04/28/19 0942 04/29/19 0631 04/30/19 0412 05/01/19 0528  WBC 9.0 10.3 10.7* 10.8* 9.1  NEUTROABS  --  8.0*  --   --   --   HGB 8.7* 9.0* 8.1* 7.5* 8.0*  HCT 28.2* 29.0* 27.0* 24.6* 26.9*  MCV 89.5 89.5 93.4 92.1  93.7  PLT 391 445* 406* 333 333   Blood Culture    Component Value Date/Time   SDES BLOOD RIGHT ARM 04/28/2019 2144   SPECREQUEST  04/28/2019 2144    BOTTLES DRAWN AEROBIC ONLY Blood Culture results may not be optimal due to an inadequate volume of blood received in culture bottles   CULT  04/28/2019 2144    NO GROWTH 5 DAYS Performed at [facility] [address] [place], [place] [address]    REPTSTATUS 05/03/2019 FINAL 04/28/2019 2144   CBG: Recent Labs  Lab 05/03/19 1132 05/03/19 1624 05/03/19 2151 05/04/19 0606 05/04/19 1112  GLUCAP 92 218* 108* 136* 197*   IMedications: . sodium chloride     . amLODipine  10 mg Oral Daily  . aspirin EC  81 mg Oral Daily  . atorvastatin  40 mg Oral QPM  . Chlorhexidine Gluconate Cloth  6 each Topical Q0600  . darbepoetin (ARANESP) injection - DIALYSIS  40 mcg Intravenous Q Sat-HD  . famotidine  20 mg Oral Daily  . FLUoxetine  10 mg Oral Daily  . gabapentin  100 mg Oral BID  . heparin  5,000 Units Subcutaneous Q8H  . insulin aspart  0-5 Units Subcutaneous QHS  . insulin aspart  0-9 Units Subcutaneous TID WC  . mouth rinse  15 mL Mouth Rinse BID  . metoprolol tartrate  100 mg Oral BID  . pantoprazole  40 mg Oral Daily  . polyethylene glycol  17 g Oral Daily  . sodium bicarbonate  1,300 mg Oral BID  . sodium chloride flush  3 mL Intravenous Q12H    Dialysis Orders: New HD start, CLIP to [facility] TTS12:30 pm  Assessment/Plan: 1. ESRD: Progressive CKD V-HD indicated with uremic symptoms/volume excess after presenting to short stay for [facility]. s/p left 2nd stage BVT by Dr. Carlis Abbott on 04/11/19. R IJ TDC placed 11/6 by IR. 1st HDon11/6.S/p HD x 3. Tolerating UF so far. K+ 4.2. Plan next HD 11/12. 2. Hypertension/volume: BP moderately elevated this AM. On amlodipine 10, metoprolol 100.Weights down 3.4kg since admission, edema resolved today. Continue to titrate volume down as tolerated. 3. Anemia: Hgb 8.0IV Fe repleted.  Tsat 41%.Received aranesp 60 on 11/7. CBC was not drawn yesterday, will have repeat Hgb drawn with first outpatient HD.  4. Metabolic bone disease:Corrected calcium 9.8, last phos 4.1. No binders/VDRA yet 5. Nutrition: Renal diet/ Fluid restriction 6. DM Type 2: insulin per primary 7. HIV positive: found on HIV screen this admission. HIV RNA < 20.  Anice Paganini, PA-C 05/04/2019, 12:32 PM  [facility] Pager: [phone]

## 2019-05-04 NOTE — Progress Notes (Signed)
Physical Therapy Treatment Patient Details Name: [name] Michael MRN: [id number] DOB: [birth year] Today's Date: 05/04/2019    History of Present Illness [name] Michael is 58 y.o female, presenting to [facility] for 2nd stage basilic vein transposition L arm. PMH CKD DM, HLD, HTN, CVA, HIV, and TIA.     PT Comments    Pt progressing well towards physical therapy goals. Pt reports she feels better today than she has in a long time. Overall pt requiring gross supervision for safety with RW for support. With youth size, pt able to maneuver walker well. Discussed d/c plan - ALF with HHPT vs SNF. Pt states she prefers SNF level follow-up at this time, and I feel this is reasonable with continued cognitive deficits in addition to her mobility not quite back to baseline. Recommendations updated to reflect this from a PT standpoint. Will continue to follow.     Follow Up Recommendations  SNF;Supervision for mobility/OOB     Equipment Recommendations  Rolling walker with 5" wheels(Youth size)    Recommendations for Other Services       Precautions / Restrictions Precautions Precautions: Fall Precaution Comments: Watch O2 Restrictions Weight Bearing Restrictions: No    Mobility  Bed Mobility Overal bed mobility: Needs Assistance Bed Mobility: Supine to Sit     Supine to sit: Supervision     General bed mobility comments: HOB slightly elevated and use of rail required. No physical assist provided from therapist.   Transfers Overall transfer level: Needs assistance Equipment used: Rolling walker (2 wheeled) Transfers: Sit to/from Stand Sit to Stand: Supervision         General transfer comment: Close supervision for safety as pt powered up to full stand. No unsteadiness or LOB noted.   Ambulation/Gait Ambulation/Gait assistance: Supervision Gait Distance (Feet): 250 Feet Assistive device: Rolling walker (2 wheeled) Gait Pattern/deviations: Step-through pattern;Decreased stride  length;Drifts right/left Gait velocity: Decreased Gait velocity interpretation: 1.31 - 2.62 ft/sec, indicative of limited community ambulator General Gait Details: With youth walker, pt was able to ambulate at a supervision level. Therapist managed O2 tank and lines.    Stairs             Wheelchair Mobility    Modified Rankin (Stroke Patients Only)       Balance Overall balance assessment: Needs assistance Sitting-balance support: Feet supported;No upper extremity supported Sitting balance-Leahy Scale: Fair     Standing balance support: No upper extremity supported;During functional activity Standing balance-Leahy Scale: Fair Standing balance comment: Able to stand at the sink and wash hands and face without UE support.                             Cognition Arousal/Alertness: Awake/alert Behavior During Therapy: Flat affect Overall Cognitive Status: Impaired/Different from baseline Area of Impairment: Memory;Safety/judgement;Awareness;Problem solving                     Memory: Decreased short-term memory   Safety/Judgement: Decreased awareness of safety;Decreased awareness of deficits Awareness: Emergent Problem Solving: Slow processing;Decreased initiation;Difficulty sequencing;Requires verbal cues        Exercises      General Comments        Pertinent Vitals/Pain Pain Assessment: No/denies pain    Home Living                      Prior Function  PT Goals (current goals can now be found in the care plan section) Acute Rehab PT Goals Patient Stated Goal: no goal stated PT Goal Formulation: With patient Time For Goal Achievement: 05/08/19 Potential to Achieve Goals: Good Progress towards PT goals: Progressing toward goals    Frequency    Min 2X/week      PT Plan Discharge plan needs to be updated; PT frequency needs to be updated    Co-evaluation              AM-PAC PT "6 Clicks" Mobility    Outcome Measure  Help needed turning from your back to your side while in a flat bed without using bedrails?: None Help needed moving from lying on your back to sitting on the side of a flat bed without using bedrails?: A Little Help needed moving to and from a bed to a chair (including a wheelchair)?: A Little Help needed standing up from a chair using your arms (e.g., wheelchair or bedside chair)?: A Little Help needed to walk in hospital room?: A Little Help needed climbing 3-5 steps with a railing? : A Lot 6 Click Score: 18    End of Session Equipment Utilized During Treatment: Gait belt Activity Tolerance: Patient tolerated treatment well Patient left: in chair;with call bell/phone within reach;with nursing/sitter in room;with chair alarm set Nurse Communication: Mobility status PT Visit Diagnosis: Unsteadiness on feet (R26.81);Muscle weakness (generalized) (M62.81)     Time: [id number] PT Time Calculation (min) (ACUTE ONLY): 33 min  Charges:  $Gait Training: 23-37 mins                     Rolinda Roan, PT, DPT Acute Rehabilitation Services Pager: [phone] Office: [phone]    Thelma Comp 05/04/2019, 11:38 AM

## 2019-05-23 ENCOUNTER — Other Ambulatory Visit: Payer: Self-pay

## 2019-05-23 ENCOUNTER — Ambulatory Visit (INDEPENDENT_AMBULATORY_CARE_PROVIDER_SITE_OTHER): Payer: Medicare Other | Admitting: Internal Medicine

## 2019-05-23 ENCOUNTER — Encounter: Payer: Self-pay | Admitting: Internal Medicine

## 2019-05-23 DIAGNOSIS — Z789 Other specified health status: Secondary | ICD-10-CM

## 2019-05-23 NOTE — Progress Notes (Signed)
   Subjective:    Patient ID: [name] Michael, female    DOB: 04/11/1961, 58 y.o.   MRN: [id number]  HPI Here for new patient visit.  Recently hospitalized with ESRD and HIV screen positive.  Confirmatory testing negative with negative HIV 1 RNA as well.   20 minutes spent including 10 minutes face to face counseling about the nature of false negative tests.   Review of Systems     Objective:   Physical Exam        Assessment & Plan:

## 2019-06-09 ENCOUNTER — Telehealth: Payer: Self-pay | Admitting: *Deleted

## 2019-06-09 NOTE — Telephone Encounter (Signed)
[name] Michael at [facility] rehab is calling for clarification. Patient is to be discharged to another facility, but they will not be able to take her if she is HIV positive per [name] Michael.  RN clarified that patient is HIV NEGATIVE, that her test was a false positive, that she does not have HIV and that this was written/sent to the facility after her office visit.  [name] Michael is asking for proof to be faxed. RN faxed office note, lab result, and written note from Dr Linus Salmons after that visit to [phone]. Landis Gandy, RN

## 2019-06-30 ENCOUNTER — Other Ambulatory Visit: Payer: Self-pay

## 2019-06-30 ENCOUNTER — Ambulatory Visit (INDEPENDENT_AMBULATORY_CARE_PROVIDER_SITE_OTHER): Payer: Self-pay | Admitting: Physician Assistant

## 2019-06-30 VITALS — BP 162/71 | HR 86 | Temp 98.7°F | Resp 20 | Ht <= 58 in | Wt 98.3 lb

## 2019-06-30 DIAGNOSIS — N186 End stage renal disease: Secondary | ICD-10-CM

## 2019-06-30 DIAGNOSIS — Z992 Dependence on renal dialysis: Secondary | ICD-10-CM

## 2019-06-30 NOTE — Progress Notes (Signed)
POST OPERATIVE OFFICE NOTE    CC:  F/u for surgery  HPI:  This is a 59 y.o. female who is s/p left arm second stage basilic transposition [id number] and first stage was performed 02/21/19.  She started HD via right TDC placed by IR in Nov.  They have recently started using the left AV fistula without problems. She is here today for follow up exam.  Prior to transposition the vein was > 0.7 cm in diameter.    She [name] Michael pain, loss of sensation or loss of motor in the left UE.  She has blue skin discoloration in the tips of her fingers left> right.  Her hands stay cold left> right.  She [name] Michael non healing ulcers in UE.    Allergies  Allergen Reactions  . Hydrocodone Nausea And Vomiting    Listed in Epic, but not on the [facility]    Current Outpatient Medications  Medication Sig Dispense Refill  . acetaminophen (TYLENOL) 325 MG tablet Take 650 mg by mouth every 4 (four) hours as needed for mild pain.    Marland Kitchen amLODipine (NORVASC) 10 MG tablet Take 10 mg by mouth daily.    Marland Kitchen aspirin 81 MG chewable tablet Chew 81 mg by mouth daily.    Marland Kitchen atorvastatin (LIPITOR) 40 MG tablet Take 40 mg by mouth at bedtime.     . diphenhydrAMINE (BANOPHEN) 25 mg capsule Take 25 mg by mouth every 4 (four) hours as needed for itching or allergies.    . famotidine (PEPCID) 10 MG tablet Take 10 mg by mouth daily.    Marland Kitchen FLUoxetine (PROZAC) 10 MG capsule Take 10 mg by mouth daily.    Marland Kitchen gabapentin (NEURONTIN) 100 MG capsule Take 1 capsule (100 mg total) by mouth 2 (two) times daily. 60 capsule 0  . insulin lispro (HUMALOG) 100 UNIT/ML injection Inject 8 Units into the skin See admin instructions. Inject 8 units into the skin three times a day with meals and hold if BGL <150 or if patient is not eating    . loratadine (CLARITIN) 10 MG tablet Take 10 mg by mouth daily.    . metoprolol tartrate (LOPRESSOR) 100 MG tablet Take 100 mg by mouth 2 (two) times daily.    . Multiple Vitamins-Minerals (CERTAVITE SENIOR/ANTIOXIDANT) TABS Take 1  tablet by mouth daily.    Marland Kitchen omeprazole (PRILOSEC) 40 MG capsule Take 40 mg by mouth daily.    . polyethylene glycol powder (GAVILAX) 17 GM/SCOOP powder Take 17 g by mouth See admin instructions. Mix 17 grams of powder into 8 ounces of fluid, stir, and drink (by mouth) once a day    . SACCHAROMYCES BOULARDII PO Take 1 capsule by mouth 2 (two) times daily.    Marland Kitchen senna-docusate (SENOKOT-S) 8.6-50 MG tablet Take 1 tablet by mouth at bedtime as needed for mild constipation. 30 tablet 0  . sodium bicarbonate 650 MG tablet Take 1,300 mg by mouth 2 (two) times daily.    . Vitamin D, Ergocalciferol, (DRISDOL) 50000 units CAPS capsule Take 1 capsule (50,000 Units total) by mouth every 7 (seven) days. (Patient taking differently: Take 50,000 Units by mouth every Wednesday. ) 30 capsule 0   No current facility-administered medications for this visit.     ROS:  See HPI  Physical Exam:    Incision:  Palpable thrill in left AV fistula above and below the dressing. Extremities:  Grip 5/5 equal B, Doppler radial, palmer, and ulnar signals Fingers tips bluish discoloration left> right, cool to  touch. Lungs non labored breathing  Assessment/Plan:  This is a 59 y.o. female who is s/p:Left basilic AV fistula The fistula has been accessed for HD and is running fine per the patient> 3 weeks.  She [name] Michael pain, loss of motor and loss of sensation in the left hand.  She has good doppler signals in the left radial, ulnar and palmar arch.    The [facility] was placed by IR and she can return to them for catheter removal.  If she has symptoms of steal she will call [name] Michael.  F/U PRN   Roxy Horseman PA-C [facility] [phone]  Clinic MD:  Oneida Alar

## 2019-11-22 DIAGNOSIS — R55 Syncope and collapse: Secondary | ICD-10-CM

## 2019-11-22 HISTORY — DX: Syncope and collapse: R55

## 2019-12-02 ENCOUNTER — Emergency Department (HOSPITAL_COMMUNITY): Payer: Medicare Other

## 2019-12-02 ENCOUNTER — Inpatient Hospital Stay (HOSPITAL_COMMUNITY)
Admission: AD | Admit: 2019-12-02 | Discharge: 2019-12-07 | DRG: 286 | Disposition: A | Discharge status: Skilled Nursing Facility | Payer: Medicare Other | Source: Skilled Nursing Facility | Attending: Internal Medicine | Admitting: Internal Medicine

## 2019-12-02 DIAGNOSIS — E8889 Other specified metabolic disorders: Secondary | ICD-10-CM | POA: Diagnosis present

## 2019-12-02 DIAGNOSIS — R300 Dysuria: Secondary | ICD-10-CM | POA: Diagnosis present

## 2019-12-02 DIAGNOSIS — Z659 Problem related to unspecified psychosocial circumstances: Secondary | ICD-10-CM

## 2019-12-02 DIAGNOSIS — I132 Hypertensive heart and chronic kidney disease with heart failure and with stage 5 chronic kidney disease, or end stage renal disease: Secondary | ICD-10-CM | POA: Diagnosis present

## 2019-12-02 DIAGNOSIS — Z8673 Personal history of transient ischemic attack (TIA), and cerebral infarction without residual deficits: Secondary | ICD-10-CM

## 2019-12-02 DIAGNOSIS — F17210 Nicotine dependence, cigarettes, uncomplicated: Secondary | ICD-10-CM | POA: Diagnosis present

## 2019-12-02 DIAGNOSIS — I10 Essential (primary) hypertension: Secondary | ICD-10-CM | POA: Diagnosis present

## 2019-12-02 DIAGNOSIS — E119 Type 2 diabetes mellitus without complications: Secondary | ICD-10-CM

## 2019-12-02 DIAGNOSIS — Z823 Family history of stroke: Secondary | ICD-10-CM

## 2019-12-02 DIAGNOSIS — M25531 Pain in right wrist: Secondary | ICD-10-CM

## 2019-12-02 DIAGNOSIS — N2581 Secondary hyperparathyroidism of renal origin: Secondary | ICD-10-CM | POA: Diagnosis present

## 2019-12-02 DIAGNOSIS — Z992 Dependence on renal dialysis: Secondary | ICD-10-CM

## 2019-12-02 DIAGNOSIS — E1165 Type 2 diabetes mellitus with hyperglycemia: Secondary | ICD-10-CM | POA: Diagnosis present

## 2019-12-02 DIAGNOSIS — E785 Hyperlipidemia, unspecified: Secondary | ICD-10-CM | POA: Diagnosis present

## 2019-12-02 DIAGNOSIS — Z794 Long term (current) use of insulin: Secondary | ICD-10-CM

## 2019-12-02 DIAGNOSIS — R55 Syncope and collapse: Principal | ICD-10-CM | POA: Diagnosis present

## 2019-12-02 DIAGNOSIS — R680 Hypothermia, not associated with low environmental temperature: Secondary | ICD-10-CM | POA: Diagnosis present

## 2019-12-02 DIAGNOSIS — G9341 Metabolic encephalopathy: Secondary | ICD-10-CM | POA: Diagnosis present

## 2019-12-02 DIAGNOSIS — D631 Anemia in chronic kidney disease: Secondary | ICD-10-CM | POA: Diagnosis present

## 2019-12-02 DIAGNOSIS — I50813 Acute on chronic right heart failure: Secondary | ICD-10-CM

## 2019-12-02 DIAGNOSIS — I959 Hypotension, unspecified: Secondary | ICD-10-CM | POA: Diagnosis present

## 2019-12-02 DIAGNOSIS — K219 Gastro-esophageal reflux disease without esophagitis: Secondary | ICD-10-CM | POA: Diagnosis present

## 2019-12-02 DIAGNOSIS — R54 Age-related physical debility: Secondary | ICD-10-CM | POA: Diagnosis present

## 2019-12-02 DIAGNOSIS — R0902 Hypoxemia: Secondary | ICD-10-CM | POA: Diagnosis present

## 2019-12-02 DIAGNOSIS — Z841 Family history of disorders of kidney and ureter: Secondary | ICD-10-CM

## 2019-12-02 DIAGNOSIS — M810 Age-related osteoporosis without current pathological fracture: Secondary | ICD-10-CM | POA: Diagnosis present

## 2019-12-02 DIAGNOSIS — F329 Major depressive disorder, single episode, unspecified: Secondary | ICD-10-CM | POA: Diagnosis present

## 2019-12-02 DIAGNOSIS — I071 Rheumatic tricuspid insufficiency: Secondary | ICD-10-CM | POA: Diagnosis present

## 2019-12-02 DIAGNOSIS — N186 End stage renal disease: Secondary | ICD-10-CM | POA: Diagnosis present

## 2019-12-02 DIAGNOSIS — Z66 Do not resuscitate: Secondary | ICD-10-CM | POA: Diagnosis present

## 2019-12-02 DIAGNOSIS — Z9115 Patient's noncompliance with renal dialysis: Secondary | ICD-10-CM

## 2019-12-02 DIAGNOSIS — Z79899 Other long term (current) drug therapy: Secondary | ICD-10-CM

## 2019-12-02 DIAGNOSIS — Z7982 Long term (current) use of aspirin: Secondary | ICD-10-CM

## 2019-12-02 DIAGNOSIS — E1122 Type 2 diabetes mellitus with diabetic chronic kidney disease: Secondary | ICD-10-CM | POA: Diagnosis present

## 2019-12-02 DIAGNOSIS — Z20822 Contact with and (suspected) exposure to covid-19: Secondary | ICD-10-CM | POA: Diagnosis present

## 2019-12-02 DIAGNOSIS — Z833 Family history of diabetes mellitus: Secondary | ICD-10-CM

## 2019-12-02 DIAGNOSIS — Z8249 Family history of ischemic heart disease and other diseases of the circulatory system: Secondary | ICD-10-CM

## 2019-12-02 DIAGNOSIS — R404 Transient alteration of awareness: Secondary | ICD-10-CM

## 2019-12-02 DIAGNOSIS — N185 Chronic kidney disease, stage 5: Secondary | ICD-10-CM | POA: Diagnosis present

## 2019-12-02 DIAGNOSIS — Z9181 History of falling: Secondary | ICD-10-CM

## 2019-12-02 DIAGNOSIS — E1129 Type 2 diabetes mellitus with other diabetic kidney complication: Secondary | ICD-10-CM | POA: Diagnosis present

## 2019-12-02 DIAGNOSIS — T402X5A Adverse effect of other opioids, initial encounter: Secondary | ICD-10-CM | POA: Diagnosis present

## 2019-12-02 DIAGNOSIS — E559 Vitamin D deficiency, unspecified: Secondary | ICD-10-CM | POA: Diagnosis present

## 2019-12-02 DIAGNOSIS — K449 Diaphragmatic hernia without obstruction or gangrene: Secondary | ICD-10-CM | POA: Diagnosis present

## 2019-12-02 DIAGNOSIS — F32A Depression, unspecified: Secondary | ICD-10-CM | POA: Diagnosis present

## 2019-12-02 DIAGNOSIS — R531 Weakness: Secondary | ICD-10-CM

## 2019-12-02 DIAGNOSIS — Z803 Family history of malignant neoplasm of breast: Secondary | ICD-10-CM

## 2019-12-02 HISTORY — DX: Tobacco use: Z72.0

## 2019-12-02 HISTORY — DX: Depression, unspecified: F32.A

## 2019-12-02 HISTORY — DX: End stage renal disease: N18.6

## 2019-12-02 HISTORY — DX: Personal history of other medical treatment: Z92.89

## 2019-12-02 HISTORY — DX: Type 2 diabetes mellitus without complications: E11.9

## 2019-12-02 HISTORY — DX: Rheumatic tricuspid insufficiency: I07.1

## 2019-12-02 LAB — I-STAT ARTERIAL BLOOD GAS, ED
Acid-Base Excess: 3 mmol/L — ABNORMAL HIGH (ref 0.0–2.0)
Bicarbonate: 28.6 mmol/L — ABNORMAL HIGH (ref 20.0–28.0)
Calcium, Ion: 1.14 mmol/L — ABNORMAL LOW (ref 1.15–1.40)
HCT: 25 % — ABNORMAL LOW (ref 36.0–46.0)
Hemoglobin: 8.5 g/dL — ABNORMAL LOW (ref 12.0–15.0)
O2 Saturation: 95 %
Patient temperature: 95.89
Potassium: 4.7 mmol/L (ref 3.5–5.1)
Sodium: 135 mmol/L (ref 135–145)
TCO2: 30 mmol/L (ref 22–32)
pCO2 arterial: 44.4 mmHg (ref 32.0–48.0)
pH, Arterial: 7.411 (ref 7.350–7.450)
pO2, Arterial: 72 mmHg — ABNORMAL LOW (ref 83.0–108.0)

## 2019-12-02 LAB — CBC WITH DIFFERENTIAL/PLATELET
Abs Immature Granulocytes: 0.05 10*3/uL (ref 0.00–0.07)
Basophils Absolute: 0 10*3/uL (ref 0.0–0.1)
Basophils Relative: 0 %
Eosinophils Absolute: 0.2 10*3/uL (ref 0.0–0.5)
Eosinophils Relative: 2 %
HCT: 28.6 % — ABNORMAL LOW (ref 36.0–46.0)
Hemoglobin: 8.8 g/dL — ABNORMAL LOW (ref 12.0–15.0)
Immature Granulocytes: 1 %
Lymphocytes Relative: 12 %
Lymphs Abs: 0.9 10*3/uL (ref 0.7–4.0)
MCH: 29.8 pg (ref 26.0–34.0)
MCHC: 30.8 g/dL (ref 30.0–36.0)
MCV: 96.9 fL (ref 80.0–100.0)
Monocytes Absolute: 0.6 10*3/uL (ref 0.1–1.0)
Monocytes Relative: 7 %
Neutro Abs: 6.2 10*3/uL (ref 1.7–7.7)
Neutrophils Relative %: 78 %
Platelets: 216 10*3/uL (ref 150–400)
RBC: 2.95 MIL/uL — ABNORMAL LOW (ref 3.87–5.11)
RDW: 16.6 % — ABNORMAL HIGH (ref 11.5–15.5)
WBC: 7.9 10*3/uL (ref 4.0–10.5)
nRBC: 0 % (ref 0.0–0.2)

## 2019-12-02 LAB — COMPREHENSIVE METABOLIC PANEL WITH GFR
ALT: 22 U/L (ref 0–44)
AST: 20 U/L (ref 15–41)
Albumin: 3.6 g/dL (ref 3.5–5.0)
Alkaline Phosphatase: 103 U/L (ref 38–126)
Anion gap: 12 (ref 5–15)
BUN: 22 mg/dL — ABNORMAL HIGH (ref 6–20)
CO2: 27 mmol/L (ref 22–32)
Calcium: 8.7 mg/dL — ABNORMAL LOW (ref 8.9–10.3)
Chloride: 96 mmol/L — ABNORMAL LOW (ref 98–111)
Creatinine, Ser: 3.75 mg/dL — ABNORMAL HIGH (ref 0.44–1.00)
GFR calc Af Amer: 14 mL/min — ABNORMAL LOW
GFR calc non Af Amer: 12 mL/min — ABNORMAL LOW
Glucose, Bld: 299 mg/dL — ABNORMAL HIGH (ref 70–99)
Potassium: 5 mmol/L (ref 3.5–5.1)
Sodium: 135 mmol/L (ref 135–145)
Total Bilirubin: 0.5 mg/dL (ref 0.3–1.2)
Total Protein: 7.5 g/dL (ref 6.5–8.1)

## 2019-12-02 LAB — LACTIC ACID, PLASMA: Lactic Acid, Venous: 2.3 mmol/L (ref 0.5–1.9)

## 2019-12-02 LAB — URINALYSIS, COMPLETE (UACMP) WITH MICROSCOPIC
Bacteria, UA: NONE SEEN
Bilirubin Urine: NEGATIVE
Glucose, UA: >=500 mg/dL — AB
Hgb urine dipstick: NEGATIVE
Ketones, ur: NEGATIVE mg/dL
Leukocytes,Ua: NEGATIVE
Nitrite: NEGATIVE
Protein, ur: 100 mg/dL — AB
Specific Gravity, Urine: 1.005 (ref 1.005–1.030)
pH: 8 (ref 5.0–8.0)

## 2019-12-02 LAB — RAPID URINE DRUG SCREEN, HOSP PERFORMED
Amphetamines: NOT DETECTED
Barbiturates: NOT DETECTED
Benzodiazepines: NOT DETECTED
Cocaine: NOT DETECTED
Opiates: NOT DETECTED
Tetrahydrocannabinol: NOT DETECTED

## 2019-12-02 LAB — CBG MONITORING, ED: Glucose-Capillary: 321 mg/dL — ABNORMAL HIGH (ref 70–99)

## 2019-12-02 LAB — SARS CORONAVIRUS 2 BY RT PCR (HOSPITAL ORDER, PERFORMED IN ~~LOC~~ HOSPITAL LAB): SARS Coronavirus 2: NEGATIVE

## 2019-12-02 LAB — ETHANOL: Alcohol, Ethyl (B): <10 mg/dL

## 2019-12-02 MED ORDER — IOHEXOL 350 MG/ML SOLN
80.0000 mL | Freq: Once | INTRAVENOUS | Status: AC | PRN
  Administered 2019-12-02: 80 mL via INTRAVENOUS

## 2019-12-02 MED ORDER — SODIUM CHLORIDE 0.9 % IV SOLN: INTRAVENOUS | Status: DC

## 2019-12-02 MED ORDER — SODIUM CHLORIDE 0.9 % IV BOLUS: 1000.0000 mL | Freq: Once | INTRAVENOUS | Status: DC

## 2019-12-02 MED ORDER — NALOXONE HCL 0.4 MG/ML IJ SOLN
0.4000 mg | Freq: Once | INTRAMUSCULAR | Status: AC
  Administered 2019-12-02: 0.4 mg via INTRAVENOUS
  Filled 2019-12-02: qty 1

## 2019-12-02 NOTE — ED Provider Notes (Signed)
[facility] EMERGENCY DEPARTMENT Provider Note   CSN: [id number] Arrival date & time: 12/02/19  1515     History Chief Complaint  Patient presents with  . Altered Mental Status    [name] Michael is a 59 y.o. female.  HPI    Patient presents from nursing facility with staff concern of altered mental status, apnea. The patient self is awake, providing details of her own HPI, but falls asleep multiple times during the encounter. She arrives via EMS and details are also obtained from those individuals. The call out was for altered mental status. Seeming the patient was outside, smoking, when she went back in, she is found to have shortness of breath, decreased interactivity. She was hypoxic, with a saturation 80% range on fire department arrival, this improved to greater than 95% with 2 L via nasal cannula provided by EMS providers. Patient notes that she has 2 new medication, Avelox and something else. Avelox was seemingly started within the past 2 days for presumed pneumonia. In addition, the patient had a fall previously, does have a right digit fracture, but no new falls. No report of fever, vomiting, diarrhea, pain anywhere beyond her hand.  Past Medical History:  Diagnosis Date  . Ambulates with cane   . CKD (chronic kidney disease)   . Constipation   . Diabetes mellitus    type 2  . GERD (gastroesophageal reflux disease)   . Hyperlipidemia   . Hypertension   . Osteoporosis   . Stroke [facility]) 04-01-11   left frontal subcortical, saw Dr. [name]   . TIA (transient ischemic attack) 03-12-11  . Vitamin D deficiency     Patient Active Problem List   Diagnosis Date Noted  . 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 AMS (altered mental status) 04/28/2019  . Hypoxemia 04/28/2019  . Fall 06/09/2018  . Fall at home, initial encounter 06/09/2018  . Anemia of chronic disease 06/09/2018  . Syncope and collapse 06/09/2018  . CKD (chronic kidney disease) stage  5, GFR less than 15 ml/min (HCC) 03/10/2018  . Acute encephalopathy 03/10/2018  . Hypermagnesemia 03/10/2018  . AKI (acute kidney injury) ([place]) 11/09/2016  . Hypercalcemia 11/09/2016  . Hyponatremia 11/09/2016  . Hypovolemia 11/09/2016  . Accelerated hypertension 11/09/2016  . Acute lower UTI 11/09/2016  . Nephrolithiasis 10/24/2016  . Constipation 10/22/2016  . CAP (community acquired pneumonia) 10/22/2016  . Hydronephrosis of right kidney 10/22/2016  . Metabolic acidosis 78/93/8101  . CVA (cerebral vascular accident) ([place]) 07/03/2016  . HAP (hospital-acquired pneumonia) 07/03/2016  . Sepsis secondary to UTI ([place])   . UTI due to extended-spectrum beta lactamase (ESBL) producing Escherichia coli   . Acute pyelonephritis   . Bacteremia due to Escherichia coli   . Colitis, indeterminate   . Uncontrolled type 2 diabetes mellitus with complication ([place])   . 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 associated with diabetes mellitus due to underlying condition ([place])   . Chronic diastolic CHF (congestive heart failure) ([place])   . Abdominal pain   . Colitis   . Hypophosphatemia 06/12/2016  . Hypokalemia 06/11/2016  . Hypocalcemia 06/11/2016  . Acute kidney injury superimposed on chronic kidney disease ([place]) 06/11/2016  . Proliferative diabetic retinopathy ([place]) 04/29/2016  . Closed fracture of left distal radius 01/28/2016  . Poor social situation 03/09/2013  . Depression 03/01/2013  . Abnormal mammogram 12/20/2012  . Retinal detachment 11/17/2012  . 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 ([place]) 75/03/2584  .  Hyperlipidemia 02/09/2007  . Essential hypertension 02/09/2007  . GERD 02/09/2007    Past Surgical History:  Procedure Laterality Date  . AV FISTULA PLACEMENT Left 02/21/2019   Procedure: BRACHIOCEPHALIC ARTERIOVENOUS (AV) FISTULA CREATION;  Surgeon: [name], MD;  Location: [facility];  Service: Vascular;  Laterality: Left;  .  BASCILIC VEIN TRANSPOSITION Left 04/11/2019   Procedure: SECOND STAGE BASILIC VEIN TRANSPOSITION LEFT ARM;  Surgeon: [name], MD;  Location: [facility];  Service: Vascular;  Laterality: Left;  . IR FLUORO GUIDE CV LINE RIGHT  04/29/2019  . IR US GUIDE VASC ACCESS RIGHT  04/29/2019  . OPEN REDUCTION INTERNAL FIXATION (ORIF) DISTAL RADIAL FRACTURE Left 01/28/2016   Procedure: OPEN REDUCTION INTERNAL FIXATION (ORIF) DISTAL RADIAL FRACTURE;  Surgeon: Iran Planas, MD;  Location: [facility];  Service: Orthopedics;  Laterality: Left;  . [place]     age 82     OB History   No obstetric history on file.     Family History  Problem Relation Age of Onset  . Stroke Mother   . Diabetes Mother   . Kidney failure Mother   . Heart failure Mother   . Stroke Father   . Cancer Sister        Breast- 62's    Social History   Tobacco Use  . Smoking status: Current Every Day Smoker    Packs/day: 0.30    Years: 32.00    Pack years: 9.60    Types: Cigarettes    Last attempt to quit: 09/23/2012    Years since quitting: 7.1  . Smokeless tobacco: Never Used  . Tobacco comment: or less  Vaping Use  . Vaping Use: Never used  Substance Use Topics  . Alcohol use: No  . Drug use: No    Home Medications Prior to Admission medications   Medication Sig Start Date End Date Taking? Authorizing Provider  acetaminophen (TYLENOL) 325 MG tablet Take 650 mg by mouth every 4 (four) hours as needed for mild pain.    [provider]  amLODipine (NORVASC) 10 MG tablet Take 10 mg by mouth daily.    [provider]  aspirin 81 MG chewable tablet Chew 81 mg by mouth daily.    [provider]  atorvastatin (LIPITOR) 40 MG tablet Take 40 mg by mouth at bedtime.     [provider]  diphenhydrAMINE (BANOPHEN) 25 mg capsule Take 25 mg by mouth every 4 (four) hours as needed for itching or allergies.    [provider]  famotidine (PEPCID) 10 MG tablet Take 10 mg by mouth daily.    [provider]  FLUoxetine (PROZAC) 10 MG capsule Take 10 mg by mouth daily.    [provider]  gabapentin (NEURONTIN) 100 MG capsule Take 1 capsule (100 mg total) by mouth 2 (two) times daily. 05/04/19 06/03/19  [name] Ghazi, Pratik D, DO  insulin lispro (HUMALOG) 100 UNIT/ML injection Inject 8 Units into the skin See admin instructions. Inject 8 units into the skin three times a day with meals and hold if BGL <150 or if patient is not eating    [provider]  loratadine (CLARITIN) 10 MG tablet Take 10 mg by mouth daily.    [provider]  metoprolol tartrate (LOPRESSOR) 100 MG tablet Take 100 mg by mouth 2 (two) times daily.    [provider]  Multiple Vitamins-Minerals (CERTAVITE SENIOR/ANTIOXIDANT)  TABS Take 1 tablet by mouth daily.    [provider]  omeprazole (PRILOSEC) 40 MG capsule Take 40 mg by mouth daily.    [provider]  polyethylene glycol powder (GAVILAX) 17 GM/SCOOP powder Take 17 g by mouth See admin instructions. Mix 17 grams of powder into 8 ounces of fluid, stir, and drink (by mouth) once a day    [provider]  SACCHAROMYCES BOULARDII PO Take 1 capsule by mouth 2 (two) times daily.    [provider]  senna-docusate (SENOKOT-S) 8.6-50 MG tablet Take 1 tablet by mouth at bedtime as needed for mild constipation. 06/11/18   Regalado, [name], MD  sodium bicarbonate 650 MG tablet Take 1,300 mg by mouth 2 (two) times daily.    [provider]  Vitamin D, Ergocalciferol, (DRISDOL) 50000 units CAPS capsule Take 1 capsule (50,000 Units total) by mouth every 7 (seven) days. Patient taking differently: Take 50,000 Units by mouth every Wednesday.  11/21/16   Ghimire, Henreitta Leber, MD    Allergies    Hydrocodone  Review of Systems   Review of Systems  Constitutional:       Per HPI, otherwise negative  HENT:       Per  HPI, otherwise negative  Respiratory:       Per HPI, otherwise negative  Cardiovascular:       Per HPI, otherwise negative  Gastrointestinal: Negative for vomiting.  Endocrine:       Negative aside from HPI  Genitourinary:       Neg aside from HPI   Musculoskeletal:       Per HPI, otherwise negative  Skin: Negative.   Neurological: Positive for weakness. Negative for syncope.    Physical Exam Updated Vital Signs BP (!) 99/42 (BP Location: Right Arm)   Pulse 65   Temp (!) 95.9 F (35.5 C) (Rectal)   Resp 12   LMP 03/06/2013 (LMP Unknown)   SpO2 98%   Physical Exam Vitals and nursing note reviewed.  Constitutional:      Appearance: She is well-developed. She is ill-appearing.     Comments: Deconditioned frail appearing female with nasal cannula in place somnolent, but awakens briefly, easily to answer questions appropriately.  HENT:     Head: Normocephalic and atraumatic.  Eyes:     Conjunctiva/sclera: Conjunctivae normal.  Cardiovascular:     Rate and Rhythm: Normal rate and regular rhythm.  Pulmonary:     Effort: Bradypnea present.     Breath sounds: No stridor. Decreased breath sounds present. No wheezing.  Abdominal:     General: There is no distension.  Musculoskeletal:       Arms:  Skin:    General: Skin is warm and dry.  Neurological:     Mental Status: She is alert and oriented to person, place, and time.     Cranial Nerves: No cranial nerve deficit.     Motor: Weakness and atrophy present.     Comments: Patient awakens easily, but falls asleep again quickly, multiple times during the interview.  There is gross atrophy throughout, without focal weakness, and the patient denies any as well.   Psychiatric:        Behavior: Behavior is slowed and withdrawn.     ED Results / Procedures / Treatments   Labs (all labs ordered are listed, but only abnormal results are displayed) Labs Reviewed  COMPREHENSIVE METABOLIC PANEL - Abnormal; Notable for the  following components:  Result Value   Chloride 96 (*)    Glucose, Bld 299 (*)    BUN 22 (*)    Creatinine, Ser 3.75 (*)    Calcium 8.7 (*)    GFR calc non Af Amer 12 (*)    GFR calc Af Amer 14 (*)    All other components within normal limits  CBC WITH DIFFERENTIAL/PLATELET - Abnormal; Notable for the following components:   RBC 2.95 (*)    Hemoglobin 8.8 (*)    HCT 28.6 (*)    RDW 16.6 (*)    All other components within normal limits  URINALYSIS, COMPLETE (UACMP) WITH MICROSCOPIC - Abnormal; Notable for the following components:   Glucose, UA >=500 (*)    Protein, ur 100 (*)    All other components within normal limits  LACTIC ACID, PLASMA - Abnormal; Notable for the following components:   Lactic Acid, Venous 2.3 (*)    All other components within normal limits  CBG MONITORING, ED - Abnormal; Notable for the following components:   Glucose-Capillary 321 (*)    All other components within normal limits  I-STAT ARTERIAL BLOOD GAS, ED - Abnormal; Notable for the following components:   pO2, Arterial 72 (*)    Bicarbonate 28.6 (*)    Acid-Base Excess 3.0 (*)    Calcium, Ion 1.14 (*)    HCT 25.0 (*)    Hemoglobin 8.5 (*)    All other components within normal limits  SARS CORONAVIRUS 2 BY RT PCR (HOSPITAL ORDER, [facility])  CULTURE, BLOOD (ROUTINE X 2)  CULTURE, BLOOD (ROUTINE X 2)  RAPID URINE DRUG SCREEN, HOSP PERFORMED  ETHANOL  BLOOD GAS, ARTERIAL    EKG EKG Interpretation  Date/Time:  Friday December 02 2019 15:23:22 EDT Ventricular Rate:  63 PR Interval:    QRS Duration: 143 QT Interval:  532 QTC Calculation: 545 R Axis:   95 Text Interpretation: Sinus rhythm Consider right atrial enlargement RBBB and LPFB Abnormal ECG Confirmed by Carmin Muskrat [id number]) on 12/02/2019 4:24:37 PM   Radiology DG Chest Port 1 View  Result Date: 12/02/2019 CLINICAL DATA:  Altered level of consciousness. EXAM: PORTABLE CHEST 1 VIEW COMPARISON:   04/28/2019 FINDINGS: Cardiomegaly. Unchanged mediastinal contours with aortic atherosclerosis. Previous right pleural effusion is diminished and is likely resolved. Bronchovascular crowding versus vascular congestion. No focal airspace disease or pneumothorax. No acute osseous abnormalities are seen IMPRESSION: Cardiomegaly. Bronchovascular crowding versus vascular congestion. Electronically Signed   By: Keith Rake M.D.   On: 12/02/2019 16:28    Procedures Procedures (including critical care time)  Medications Ordered in ED Medications  naloxone (NARCAN) injection 0.4 mg (0.4 mg Intravenous Given 12/02/19 1601)    ED Course  I have reviewed the triage vital signs and the nursing notes.  Pertinent labs & imaging results that were available during my care of the patient were reviewed by me and considered in my medical decision making (see chart for details).     Elderly female presenting from facility with concern for altered mental status, dyspnea. With history of relative immunocompromise state, smoking, differential including pneumonia, PE, uremic encephalopathy, electrolyte abnormalities, stroke all considered.  CT, x-ray ordered.  Update:, Patient remains hypersomnolent, a trial of Narcan was provided, without appreciable change in her condition.  With 2 L nasal cannula patient has appropriate saturation, but she remains altered.  Update:, X-ray suggests vascular congestion, initial labs notable for mild  lactic acidosis, hyperglycemia, persistent anemia, but generally nonrevealing.  Update:, Patient now more awake, though she continues to require 2 L via nasal cannula.  She now notes that she has taken a handful of meds earlier in the day, but again it is unclear what or what her intention was.  Patient and I discussed goals of care, she confirms that she is DNR, DNI.  Covid negative  6:57 PM Patient remains hypotensive, and with concern for hypoxia, altered mental status,  PE remains on differential, findings otherwise thus far nonrevealing. Covid negative 8:13 PM Patient substantially more awake. She continues to reiterate that she took several medications this morning, but does not know which ones. One may have been moxifloxacin. Patient is no longer hypersomnolent, but continues to require supplemental oxygen. Studies thus far notable for findings as above, but with persistent hypoxia, patient will have CT angiography. Patient will require admission regardless, CT scan pending. 11:41 PM CT chest performed.  Patient continues to require 2 L via nasal cannula for appropriate saturation.  Elderly, generally unwell appearing female presents with altered mental status, hypoxia.  Patient's evaluation here notable mostly for demonstration of worsening renal function, which may be contributing to her encephalopathy. She did improve clinically substantially, given persistent need for oxygen supplementation in spite of otherwise reassuring findings, no PE, no pneumonia, no other obvious infection, the patient require admission for further monitoring, management.  Final Clinical Impression(s) / ED Diagnoses Final diagnoses:  Transient alteration of awareness  Hypoxia    Rx / DC Orders ED Discharge Orders    None       Carmin Muskrat, MD 12/02/19 2351

## 2019-12-02 NOTE — H&P (Signed)
[facility] History and Physical  [name] Michael FTD:[id number] DOB: [birth year] DOA: 12/02/2019  Referring EDP: Vanita Panda PCP: Charlott Rakes, MD   Chief Complaint: AMS  HPI: [name] Michael is a 59 y.o. female with PMH of HTN, HLD, DM, ESRD, MDD who presented for AMS and admitted for hypoxia and syncope workup.  Patient reports that she was at her nursing facility today and was doing her laundry and started to feel weak so she went to the dining room and sat down and per her reports, she passed out. She is unsure how long this happened. Per nursing home report, she was brought to ER for AMS. Patient reports starting yesterday she was given Moxifloxacin and Oxycodone which are new meds and she is unsure why. Denies other complaints. Denies headache, dizziness, fever, chills, cough, SOB, chest pain, abdominal pain, nausea, vomiting, diarrhea, constipation, dysuria, hematuria, hematochezia, melena, difficulty moving arms/legs, speech difficulty, trouble eating, confusion or any other complaints.  In the ED: Slightly hypotensive and temp 95.24F otherwise stable on room air. Labs remarkable for glucose 299, Cr 3.75, Hgb 8.8, WBC 7.9, Ethanol level and UDS WNL, Lactate 2.3, UA without evidence of infection, COVID neg.  CXR: Cardiomegaly. Bronchovascular crowding versus vascular congestion.  CT Head and CTA Chest: Non-acute  EDP requested admission for hypoxia.  Review of Systems:  All other systems negative unless noted above in HPI.   Past Medical History:  Diagnosis Date  . Ambulates with cane   . CKD (chronic kidney disease)   . Constipation   . Diabetes mellitus    type 2  . GERD (gastroesophageal reflux disease)   . Hyperlipidemia   . Hypertension   . Osteoporosis   . Stroke [facility]) 04-01-11   left frontal subcortical, saw Dr. [name]   . TIA (transient ischemic attack) 03-12-11  . Vitamin D deficiency    Past Surgical History:  Procedure Laterality Date  . AV FISTULA  PLACEMENT Left 02/21/2019   Procedure: BRACHIOCEPHALIC ARTERIOVENOUS (AV) FISTULA CREATION;  Surgeon: [name], MD;  Location: [place];  Service: Vascular;  Laterality: Left;  . BASCILIC VEIN TRANSPOSITION Left 04/11/2019   Procedure: SECOND STAGE BASILIC VEIN TRANSPOSITION LEFT ARM;  Surgeon: [name], MD;  Location: [place];  Service: Vascular;  Laterality: Left;  . IR FLUORO GUIDE CV LINE RIGHT  04/29/2019  . IR US GUIDE VASC ACCESS RIGHT  04/29/2019  . OPEN REDUCTION INTERNAL FIXATION (ORIF) DISTAL RADIAL FRACTURE Left 01/28/2016   Procedure: OPEN REDUCTION INTERNAL FIXATION (ORIF) DISTAL RADIAL FRACTURE;  Surgeon: Iran Planas, MD;  Location: [place];  Service: Orthopedics;  Laterality: Left;  . [place]     age 24   Social History:  reports that she has been smoking cigarettes. She has a 9.60 pack-year smoking history. She has never used smokeless tobacco. She reports that she does not drink alcohol and does not use drugs.  Allergies  Allergen Reactions  . Hydrocodone Nausea And Vomiting    Listed in Epic, but not on the [facility]    Family History  Problem Relation Age of Onset  . Stroke Mother   . Diabetes Mother   . Kidney failure Mother   . Heart failure Mother   . Stroke Father   . Cancer Sister        Breast- 47's    Prior to Admission medications   Medication Sig Start Date End Date Taking?  Authorizing Provider  acetaminophen (TYLENOL) 325 MG tablet Take 650 mg by mouth every 4 (four) hours as needed for mild pain.   Yes [provider]  amLODipine (NORVASC) 10 MG tablet Take 10 mg by mouth daily.   Yes [provider]  aspirin 81 MG chewable tablet Chew 81 mg by mouth daily.   Yes [provider]  atorvastatin (LIPITOR) 40 MG tablet Take 40 mg by mouth at bedtime.    Yes [provider]  famotidine (PEPCID) 10 MG tablet Take 10 mg by mouth daily.   Yes  [provider]  FLUoxetine (PROZAC) 10 MG capsule Take 10 mg by mouth daily.   Yes [provider]  gabapentin (NEURONTIN) 100 MG capsule Take 1 capsule (100 mg total) by mouth 2 (two) times daily. 05/04/19 12/02/19 Yes Shah, Pratik D, DO  insulin lispro (HUMALOG) 100 UNIT/ML injection Inject 12 Units into the skin 3 (three) times daily with meals. Hold for BGL <150 or if patient is not eating.   Yes [provider]  loratadine (CLARITIN) 10 MG tablet Take 10 mg by mouth daily.   Yes [provider]  metoprolol tartrate (LOPRESSOR) 100 MG tablet Take 100 mg by mouth 2 (two) times daily.   Yes [provider]  Multiple Vitamins-Minerals (CERTAVITE SENIOR/ANTIOXIDANT) TABS Take 1 tablet by mouth daily.   Yes [provider]  omeprazole (PRILOSEC) 40 MG capsule Take 40 mg by mouth daily.   Yes [provider]  OXYCODONE HCL PO Take 2.5 mg by mouth 2 (two) times daily as needed (pain).   Yes [provider]  polyethylene glycol powder (GAVILAX) 17 GM/SCOOP powder Take 17 g by mouth See admin instructions. Mix 17 grams of powder into 8 ounces of fluid, stir, and drink (by mouth) once a day   Yes [provider]  SACCHAROMYCES BOULARDII PO Take 1 capsule by mouth 2 (two) times daily.   Yes [provider]  sevelamer carbonate (RENVELA) 800 MG tablet Take 800 mg by mouth 3 (three) times daily with meals.   Yes [provider]  diphenhydrAMINE (BANOPHEN) 25 mg capsule Take 25 mg by mouth every 4 (four) hours as needed for itching or allergies.    [provider]  senna-docusate (SENOKOT-S) 8.6-50 MG tablet Take 1 tablet by mouth at bedtime as needed for mild constipation. 06/11/18   Regalado, [name], MD  sodium bicarbonate 650 MG tablet Take 1,300 mg by mouth 2 (two) times daily.    [provider]  Vitamin D, Ergocalciferol, (DRISDOL) 50000 units CAPS capsule Take 1 capsule (50,000 Units  total) by mouth every 7 (seven) days. Patient taking differently: Take 50,000 Units by mouth every Wednesday.  11/21/16   Jonetta Osgood, MD   Physical Exam: Vitals:   12/02/19 1908 12/02/19 1923 12/02/19 1938 12/02/19 2220  BP:    (!) 119/47  Pulse: 65 66 65 68  Resp:   20 12  Temp:      TempSrc:      SpO2: 97% 99% 94% 99%    Wt Readings from Last 3 Encounters:  06/30/19 44.6 kg  05/23/19 42.2 kg  05/03/19 45.5 kg    . General:  Appears calm and comfortable. AAOx4.  . Eyes: EOMI, normal lids, irises & conjunctiva . ENT: grossly normal hearing, lips & tongue . Neck: normal ROM . Cardiovascular: RRR, no m/r/g. No LE edema. Marland Kitchen Respiratory: CTA bilaterally, no w/r/r. Normal respiratory effort. . Abdomen: soft,  ntnd . Skin: no rash or induration seen on limited exam . Musculoskeletal: grossly normal tone BUE/BLE . Psychiatric: somewhat flat affect, speech fluent and appropriate but slowed . Neurologic: grossly non-focal.          Labs on Admission:  Basic Metabolic Panel: Recent Labs  Lab 12/02/19 1603 12/02/19 1721  NA 135 135  K 5.0 4.7  CL 96*  --   CO2 27  --   GLUCOSE 299*  --   BUN 22*  --   CREATININE 3.75*  --   CALCIUM 8.7*  --    Liver Function Tests: Recent Labs  Lab 12/02/19 1603  AST 20  ALT 22  ALKPHOS 103  BILITOT 0.5  PROT 7.5  ALBUMIN 3.6   No results for input(s): LIPASE, AMYLASE in the last 168 hours. No results for input(s): AMMONIA in the last 168 hours. CBC: Recent Labs  Lab 12/02/19 1603 12/02/19 1721  WBC 7.9  --   NEUTROABS 6.2  --   HGB 8.8* 8.5*  HCT 28.6* 25.0*  MCV 96.9  --   PLT 216  --    Cardiac Enzymes: No results for input(s): CKTOTAL, CKMB, CKMBINDEX, TROPONINI in the last 168 hours.  BNP (last 3 results) No results for input(s): BNP in the last 8760 hours.  ProBNP (last 3 results) No results for input(s): PROBNP in the last 8760 hours.  CBG: Recent Labs  Lab 12/02/19 1618  GLUCAP 321*     Radiological Exams on Admission: CT HEAD WO CONTRAST  Result Date: 12/02/2019 CLINICAL DATA:  Altered mental status. EXAM: CT HEAD WITHOUT CONTRAST TECHNIQUE: Contiguous axial images were obtained from the base of the skull through the vertex without intravenous contrast. COMPARISON:  CT head dated April 28, 2019. FINDINGS: Brain: No evidence of acute infarction, hemorrhage, hydrocephalus, extra-axial collection or mass lesion/mass effect. Small bilateral basal ganglia chronic lacunar infarcts again noted. Stable mild atrophy and chronic microvascular ischemic changes. Vascular: Calcified atherosclerosis at the skullbase. No hyperdense vessel. Skull: Normal. Negative for fracture or focal lesion. Sinuses/Orbits: No acute finding. Chronic small vitreous hemorrhage in the posterior left globe. Other: None. IMPRESSION: 1. No acute intracranial abnormality. Electronically Signed   By: Titus Dubin M.D.   On: 12/02/2019 18:56   CT Angio Chest PE W and/or Wo Contrast  Result Date: 12/02/2019 CLINICAL DATA:  Shortness of breath.  Altered mental status. EXAM: CT ANGIOGRAPHY CHEST WITH CONTRAST TECHNIQUE: Multidetector CT imaging of the chest was performed using the standard protocol during bolus administration of intravenous contrast. Multiplanar CT image reconstructions and MIPs were obtained to evaluate the vascular anatomy. CONTRAST:  6mL OMNIPAQUE IOHEXOL 350 MG/ML SOLN COMPARISON:  Radiograph earlier this day. FINDINGS: Cardiovascular: There are no filling defects within the pulmonary arteries to suggest pulmonary embolus. Aortic atherosclerosis. No aneurysm. Common origin of the brachiocephalic and left common carotid artery, variant arch anatomy. Mild multi chamber cardiomegaly. There is contrast refluxing into the hepatic veins and IVC. No pericardial effusion. Mediastinum/Nodes: No enlarged mediastinal or hilar lymph nodes. No thyroid nodule. Patulous esophagus with small hiatal hernia. No  definite wall thickening. Lungs/Pleura: Dependent atelectasis. Subsegmental atelectasis in the lingula. No septal thickening or pulmonary edema. No pleural effusion. Trachea and mainstem bronchi are patent. Upper Abdomen: Contrast refluxing into the hepatic veins and IVC. No other acute findings. Musculoskeletal: There are no acute or suspicious osseous abnormalities. Review of the MIP images confirms the above findings. IMPRESSION: 1. No pulmonary embolus. 2. Mild multi chamber cardiomegaly  with contrast refluxing into the hepatic veins and IVC consistent with elevated right heart pressures. 3. Patulous esophagus with small hiatal hernia. Aortic Atherosclerosis (ICD10-I70.0). Electronically Signed   By: Keith Rake M.D.   On: 12/02/2019 23:42   DG Chest Port 1 View  Result Date: 12/02/2019 CLINICAL DATA:  Altered level of consciousness. EXAM: PORTABLE CHEST 1 VIEW COMPARISON:  04/28/2019 FINDINGS: Cardiomegaly. Unchanged mediastinal contours with aortic atherosclerosis. Previous right pleural effusion is diminished and is likely resolved. Bronchovascular crowding versus vascular congestion. No focal airspace disease or pneumothorax. No acute osseous abnormalities are seen IMPRESSION: Cardiomegaly. Bronchovascular crowding versus vascular congestion. Electronically Signed   By: Keith Rake M.D.   On: 12/02/2019 16:28    EKG: Independently reviewed. HR 60. Sinus rhythm. QTc 545. No STEMI. RBBB and LPFB which are new compared to 11/5 and 04/29/2019.   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 female with PMH of HTN, HLD, DM, ESRD, MDD who presented for AMS and admitted for hypoxia and syncope workup.  Acute Encephalopathy;  resolved Syncope? Hypoxia, POA -patient on 2L O2 on admission with lactate 2.3, temp 95.46F and BP's borderline low; otherwise no evidence of infection -infection vs fluid overload vs cardiac etiology vs medication-induced  -will not start abx at this time due to low suspicious  -per patient report, syncopal episode while doing laundry -will obtain EKG and keep on Tele; new changes on EKG -will obtain Trop and BNP although ESRD may falsely elevate values  -will repeat lactate and not give fluids at this time unless persistently elevated due to hx ESRD -admitted in Nov 2020 for AMS and hypoxia as well > thought to be secondary to fluid overload  -COVID neg -CXR without evidence of PNA -On Moxifloxacin at nursing home for unknown reason -Hold Oxycodone (patient believes new medications caused her to feel this way today)  ESRD -Status post placement of AV fistula -unclear whether patient is still on dialysis -Cr stable  -Consult Nephro PRN  Type 2 diabetes, well controlled -Hgb A1c 6.5 in Nov 2020; will repeat -Cont Aspart 12 u with meals  -SSI  Hyperlipidemia -Continue Lipitor  Hypertension -Hold Lopressor, amlodipine due to soft pressures   Depression -Continue Prozac  Code Status: Full DVT Prophylaxis: Heparin Family Communication: None Disposition Plan: Admit to inpatient. Currently requiring O2 and needs further workup of possible syncope vs pre-syncope. Patient is at high risk for further decompensation due to age and co-morbidities.    Time spent: 70 minutes  Chauncey Mann, MD [facility] Pager [phone]

## 2019-12-02 NOTE — ED Notes (Signed)
[phone] sharon Allbaugh sister would please like an update

## 2019-12-02 NOTE — ED Notes (Signed)
Pt will wake up and answer questions appropriately then fall back to sleep    Pt also continues to say that she took medication this am for pain.

## 2019-12-02 NOTE — ED Notes (Signed)
Taken to CT.

## 2019-12-02 NOTE — ED Triage Notes (Signed)
Pt to ED via [facility] from [facility] with c/o altered mental status and low 02 sat's.  EMS reports pt's 02 sat's were low 80's on their arrival.  Pt was placed on [place] 02 at 2LPM

## 2019-12-03 ENCOUNTER — Inpatient Hospital Stay (HOSPITAL_COMMUNITY): Payer: Medicare Other

## 2019-12-03 DIAGNOSIS — I50813 Acute on chronic right heart failure: Secondary | ICD-10-CM | POA: Diagnosis present

## 2019-12-03 DIAGNOSIS — F17210 Nicotine dependence, cigarettes, uncomplicated: Secondary | ICD-10-CM | POA: Diagnosis present

## 2019-12-03 DIAGNOSIS — R404 Transient alteration of awareness: Secondary | ICD-10-CM | POA: Diagnosis not present

## 2019-12-03 DIAGNOSIS — M25531 Pain in right wrist: Secondary | ICD-10-CM | POA: Diagnosis present

## 2019-12-03 DIAGNOSIS — F329 Major depressive disorder, single episode, unspecified: Secondary | ICD-10-CM | POA: Diagnosis present

## 2019-12-03 DIAGNOSIS — E782 Mixed hyperlipidemia: Secondary | ICD-10-CM

## 2019-12-03 DIAGNOSIS — Z20822 Contact with and (suspected) exposure to covid-19: Secondary | ICD-10-CM | POA: Diagnosis present

## 2019-12-03 DIAGNOSIS — N185 Chronic kidney disease, stage 5: Secondary | ICD-10-CM | POA: Diagnosis not present

## 2019-12-03 DIAGNOSIS — R680 Hypothermia, not associated with low environmental temperature: Secondary | ICD-10-CM | POA: Diagnosis present

## 2019-12-03 DIAGNOSIS — I10 Essential (primary) hypertension: Secondary | ICD-10-CM | POA: Diagnosis not present

## 2019-12-03 DIAGNOSIS — K219 Gastro-esophageal reflux disease without esophagitis: Secondary | ICD-10-CM | POA: Diagnosis present

## 2019-12-03 DIAGNOSIS — I361 Nonrheumatic tricuspid (valve) insufficiency: Secondary | ICD-10-CM | POA: Diagnosis not present

## 2019-12-03 DIAGNOSIS — I071 Rheumatic tricuspid insufficiency: Secondary | ICD-10-CM | POA: Diagnosis present

## 2019-12-03 DIAGNOSIS — E1165 Type 2 diabetes mellitus with hyperglycemia: Secondary | ICD-10-CM

## 2019-12-03 DIAGNOSIS — E785 Hyperlipidemia, unspecified: Secondary | ICD-10-CM | POA: Diagnosis present

## 2019-12-03 DIAGNOSIS — E1122 Type 2 diabetes mellitus with diabetic chronic kidney disease: Secondary | ICD-10-CM | POA: Diagnosis present

## 2019-12-03 DIAGNOSIS — E8889 Other specified metabolic disorders: Secondary | ICD-10-CM | POA: Diagnosis present

## 2019-12-03 DIAGNOSIS — Z9115 Patient's noncompliance with renal dialysis: Secondary | ICD-10-CM | POA: Diagnosis not present

## 2019-12-03 DIAGNOSIS — N2581 Secondary hyperparathyroidism of renal origin: Secondary | ICD-10-CM | POA: Diagnosis present

## 2019-12-03 DIAGNOSIS — R55 Syncope and collapse: Secondary | ICD-10-CM | POA: Diagnosis present

## 2019-12-03 DIAGNOSIS — I340 Nonrheumatic mitral (valve) insufficiency: Secondary | ICD-10-CM | POA: Diagnosis not present

## 2019-12-03 DIAGNOSIS — R531 Weakness: Secondary | ICD-10-CM | POA: Diagnosis not present

## 2019-12-03 DIAGNOSIS — I132 Hypertensive heart and chronic kidney disease with heart failure and with stage 5 chronic kidney disease, or end stage renal disease: Secondary | ICD-10-CM | POA: Diagnosis present

## 2019-12-03 DIAGNOSIS — D631 Anemia in chronic kidney disease: Secondary | ICD-10-CM | POA: Diagnosis present

## 2019-12-03 DIAGNOSIS — R0902 Hypoxemia: Secondary | ICD-10-CM | POA: Diagnosis present

## 2019-12-03 DIAGNOSIS — Z66 Do not resuscitate: Secondary | ICD-10-CM | POA: Diagnosis present

## 2019-12-03 DIAGNOSIS — R54 Age-related physical debility: Secondary | ICD-10-CM | POA: Diagnosis present

## 2019-12-03 DIAGNOSIS — T402X5A Adverse effect of other opioids, initial encounter: Secondary | ICD-10-CM | POA: Diagnosis present

## 2019-12-03 DIAGNOSIS — N186 End stage renal disease: Secondary | ICD-10-CM | POA: Diagnosis present

## 2019-12-03 DIAGNOSIS — G9341 Metabolic encephalopathy: Secondary | ICD-10-CM | POA: Diagnosis present

## 2019-12-03 DIAGNOSIS — E1129 Type 2 diabetes mellitus with other diabetic kidney complication: Secondary | ICD-10-CM

## 2019-12-03 DIAGNOSIS — I959 Hypotension, unspecified: Secondary | ICD-10-CM | POA: Diagnosis present

## 2019-12-03 LAB — CBC
HCT: 27.1 % — ABNORMAL LOW (ref 36.0–46.0)
Hemoglobin: 8.5 g/dL — ABNORMAL LOW (ref 12.0–15.0)
MCH: 30.2 pg (ref 26.0–34.0)
MCHC: 31.4 g/dL (ref 30.0–36.0)
MCV: 96.4 fL (ref 80.0–100.0)
Platelets: 241 10*3/uL (ref 150–400)
RBC: 2.81 MIL/uL — ABNORMAL LOW (ref 3.87–5.11)
RDW: 16.1 % — ABNORMAL HIGH (ref 11.5–15.5)
WBC: 7.7 10*3/uL (ref 4.0–10.5)
nRBC: 0 % (ref 0.0–0.2)

## 2019-12-03 LAB — BASIC METABOLIC PANEL
Anion gap: 15 (ref 5–15)
BUN: 28 mg/dL — ABNORMAL HIGH (ref 6–20)
CO2: 24 mmol/L (ref 22–32)
Calcium: 8.6 mg/dL — ABNORMAL LOW (ref 8.9–10.3)
Chloride: 98 mmol/L (ref 98–111)
Creatinine, Ser: 4.31 mg/dL — ABNORMAL HIGH (ref 0.44–1.00)
GFR calc Af Amer: 12 mL/min — ABNORMAL LOW (ref >60)
GFR calc non Af Amer: 11 mL/min — ABNORMAL LOW (ref >60)
Glucose, Bld: 156 mg/dL — ABNORMAL HIGH (ref 70–99)
Potassium: 4.4 mmol/L (ref 3.5–5.1)
Sodium: 137 mmol/L (ref 135–145)

## 2019-12-03 LAB — GLUCOSE, CAPILLARY
Glucose-Capillary: 175 mg/dL — ABNORMAL HIGH (ref 70–99)
Glucose-Capillary: 231 mg/dL — ABNORMAL HIGH (ref 70–99)
Glucose-Capillary: 238 mg/dL — ABNORMAL HIGH (ref 70–99)
Glucose-Capillary: 227 mg/dL — ABNORMAL HIGH (ref 70–99)
Glucose-Capillary: 202 mg/dL — ABNORMAL HIGH (ref 70–99)
Glucose-Capillary: 261 mg/dL — ABNORMAL HIGH (ref 70–99)
Glucose-Capillary: 217 mg/dL — ABNORMAL HIGH (ref 70–99)

## 2019-12-03 LAB — CREATININE, URINE, RANDOM: Creatinine, Urine: 29.37 mg/dL

## 2019-12-03 LAB — HEMOGLOBIN A1C
Hgb A1c MFr Bld: 8.8 % — ABNORMAL HIGH (ref 4.8–5.6)
Mean Plasma Glucose: 205.86 mg/dL

## 2019-12-03 LAB — ECHOCARDIOGRAM COMPLETE
Height: 54 in
Weight: 1746.04 oz

## 2019-12-03 LAB — LACTIC ACID, PLASMA: Lactic Acid, Venous: 1.5 mmol/L (ref 0.5–1.9)

## 2019-12-03 LAB — PROCALCITONIN: Procalcitonin: 0.4 ng/mL

## 2019-12-03 LAB — BRAIN NATRIURETIC PEPTIDE: B Natriuretic Peptide: 1275.7 pg/mL — ABNORMAL HIGH (ref 0.0–100.0)

## 2019-12-03 LAB — SODIUM, URINE, RANDOM: Sodium, Ur: 46 mmol/L

## 2019-12-03 LAB — TROPONIN I (HIGH SENSITIVITY): Troponin I (High Sensitivity): 8 ng/L (ref <18)

## 2019-12-03 MED ORDER — ACETAMINOPHEN 325 MG PO TABS
650.0000 mg | ORAL_TABLET | Freq: Four times a day (QID) | ORAL | Status: DC | PRN
  Administered 2019-12-04 (×2): 650 mg via ORAL
  Filled 2019-12-03 (×2): qty 2

## 2019-12-03 MED ORDER — ATORVASTATIN CALCIUM 40 MG PO TABS
40.0000 mg | ORAL_TABLET | Freq: Every day | ORAL | Status: DC
  Administered 2019-12-04 – 2019-12-06 (×4): 40 mg via ORAL
  Filled 2019-12-03 (×4): qty 1

## 2019-12-03 MED ORDER — HEPARIN SODIUM (PORCINE) 5000 UNIT/ML IJ SOLN
5000.0000 [IU] | Freq: Three times a day (TID) | INTRAMUSCULAR | Status: DC
  Administered 2019-12-03 – 2019-12-05 (×6): 5000 [IU] via SUBCUTANEOUS
  Filled 2019-12-03 (×6): qty 1

## 2019-12-03 MED ORDER — CHLORHEXIDINE GLUCONATE CLOTH 2 % EX PADS
6.0000 | MEDICATED_PAD | Freq: Every day | CUTANEOUS | Status: DC
  Administered 2019-12-03 – 2019-12-07 (×4): 6 via TOPICAL

## 2019-12-03 MED ORDER — PANTOPRAZOLE SODIUM 40 MG PO TBEC
40.0000 mg | DELAYED_RELEASE_TABLET | Freq: Every day | ORAL | Status: DC
  Administered 2019-12-03 – 2019-12-07 (×5): 40 mg via ORAL
  Filled 2019-12-03 (×5): qty 1

## 2019-12-03 MED ORDER — CHLORHEXIDINE GLUCONATE CLOTH 2 % EX PADS
6.0000 | MEDICATED_PAD | Freq: Every day | CUTANEOUS | Status: DC
  Administered 2019-12-04 – 2019-12-05 (×2): 6 via TOPICAL

## 2019-12-03 MED ORDER — SENNOSIDES-DOCUSATE SODIUM 8.6-50 MG PO TABS: 1.0000 | ORAL_TABLET | Freq: Every evening | ORAL | Status: DC | PRN

## 2019-12-03 MED ORDER — GABAPENTIN 100 MG PO CAPS
100.0000 mg | ORAL_CAPSULE | Freq: Two times a day (BID) | ORAL | Status: DC
  Administered 2019-12-03 – 2019-12-07 (×9): 100 mg via ORAL
  Filled 2019-12-03 (×9): qty 1

## 2019-12-03 MED ORDER — INSULIN ASPART 100 UNIT/ML ~~LOC~~ SOLN
12.0000 [IU] | Freq: Three times a day (TID) | SUBCUTANEOUS | Status: DC
  Administered 2019-12-03 – 2019-12-07 (×7): 12 [IU] via SUBCUTANEOUS

## 2019-12-03 MED ORDER — SEVELAMER CARBONATE 800 MG PO TABS
800.0000 mg | ORAL_TABLET | Freq: Three times a day (TID) | ORAL | Status: DC
  Administered 2019-12-03 – 2019-12-07 (×11): 800 mg via ORAL
  Filled 2019-12-03 (×12): qty 1

## 2019-12-03 MED ORDER — INSULIN ASPART 100 UNIT/ML ~~LOC~~ SOLN: 0.0000 [IU] | Freq: Every day | SUBCUTANEOUS | Status: DC

## 2019-12-03 MED ORDER — INSULIN ASPART 100 UNIT/ML ~~LOC~~ SOLN
0.0000 [IU] | Freq: Three times a day (TID) | SUBCUTANEOUS | Status: DC
  Administered 2019-12-03 – 2019-12-04 (×4): 3 [IU] via SUBCUTANEOUS
  Administered 2019-12-04: 1 [IU] via SUBCUTANEOUS
  Administered 2019-12-05 – 2019-12-07 (×4): 2 [IU] via SUBCUTANEOUS

## 2019-12-03 MED ORDER — DOXERCALCIFEROL 4 MCG/2ML IV SOLN
6.0000 ug | INTRAVENOUS | Status: DC
  Administered 2019-12-06: 6 ug via INTRAVENOUS

## 2019-12-03 MED ORDER — SODIUM BICARBONATE 650 MG PO TABS: 1300.0000 mg | ORAL_TABLET | Freq: Two times a day (BID) | ORAL | Status: DC

## 2019-12-03 MED ORDER — POLYETHYLENE GLYCOL 3350 17 G PO PACK
17.0000 g | PACK | Freq: Every day | ORAL | Status: DC
  Administered 2019-12-03 – 2019-12-05 (×3): 17 g via ORAL
  Filled 2019-12-03 (×5): qty 1

## 2019-12-03 MED ORDER — ASPIRIN 81 MG PO CHEW
81.0000 mg | CHEWABLE_TABLET | Freq: Every day | ORAL | Status: DC
  Administered 2019-12-03 – 2019-12-07 (×5): 81 mg via ORAL
  Filled 2019-12-03 (×5): qty 1

## 2019-12-03 MED ORDER — FLUOXETINE HCL 10 MG PO CAPS
10.0000 mg | ORAL_CAPSULE | Freq: Every day | ORAL | Status: DC
  Administered 2019-12-03 – 2019-12-07 (×5): 10 mg via ORAL
  Filled 2019-12-03 (×5): qty 1

## 2019-12-03 MED ORDER — DARBEPOETIN ALFA 40 MCG/0.4ML IJ SOSY: 40.0000 ug | PREFILLED_SYRINGE | INTRAMUSCULAR | Status: DC

## 2019-12-03 NOTE — Progress Notes (Signed)
*  PRELIMINARY RESULTS* Echocardiogram 2D Echocardiogram has been performed.  Samuel Germany 12/03/2019, 4:04 PM

## 2019-12-03 NOTE — Consult Note (Addendum)
[facility] Renal Consultation Note    Indication for Consultation:  Management of ESRD/hemodialysis, anemia, hypertension/volume, and secondary hyperparathyroidism.  HPI: [name] Michael is a 59 y.o. female with a PMH including ESRD on dialysis TTS, T2DM, stroke/TIAQ, HTN who presented to the ED on 12/02/19 for syncope/AMS. Patient reports she had just taken two new medications (moxifloxacin and a pain medication) and was doing laundry when she suddenly became very fatigued. She believes she fainted. She was sent to the ED from her SNF for AMS. In the HD, pt slightly hypotensive and hypothermic. CXR with cardiomegaly and bronchovascular crowding vs. Vascular congestion, CT head unremarkable. CT angio negative for PE. Nephrology consulted to manage ESRD needs. This AM, Cr 4.31, K+ 4.4, BUN 28, Hgb 8.5.   At time of exam, patient is alert and oriented x3. She reports she fell out of bed sometime this week and hurt her R wrist, which is why she was given pain medication. She reports the wrist is still sore but pain is manageable at this time. She also reports she has been having dysuria for the past several days but denies hematuria, fever, chills and flank pain. Pt reports she did not go to dialysis on Tuesday because she was not feeling well, but did attend on Thursday. She denies any SOB, orthopnea, edema,  CP, palpitations, dizziness, abdominal pain, N/V/D.   Past Medical History:  Diagnosis Date  . Ambulates with cane   . CKD (chronic kidney disease)   . Constipation   . Diabetes mellitus    type 2  . GERD (gastroesophageal reflux disease)   . Hyperlipidemia   . Hypertension   . Osteoporosis   . Stroke [facility]) 04-01-11   left frontal subcortical, saw Dr. [name]   . TIA (transient ischemic attack) 03-12-11  . Vitamin D deficiency    Past Surgical History:  Procedure Laterality Date  . AV FISTULA PLACEMENT Left 02/21/2019   Procedure: BRACHIOCEPHALIC ARTERIOVENOUS (AV) FISTULA  CREATION;  Surgeon: [name], MD;  Location: [place];  Service: Vascular;  Laterality: Left;  . BASCILIC VEIN TRANSPOSITION Left 04/11/2019   Procedure: SECOND STAGE BASILIC VEIN TRANSPOSITION LEFT ARM;  Surgeon: [name], MD;  Location: [place];  Service: Vascular;  Laterality: Left;  . IR FLUORO GUIDE CV LINE RIGHT  04/29/2019  . IR US GUIDE VASC ACCESS RIGHT  04/29/2019  . OPEN REDUCTION INTERNAL FIXATION (ORIF) DISTAL RADIAL FRACTURE Left 01/28/2016   Procedure: OPEN REDUCTION INTERNAL FIXATION (ORIF) DISTAL RADIAL FRACTURE;  Surgeon: Iran Planas, MD;  Location: [place];  Service: Orthopedics;  Laterality: Left;  . [place]     age 28   Family History  Problem Relation Age of Onset  . Stroke Mother   . Diabetes Mother   . Kidney failure Mother   . Heart failure Mother   . Stroke Father   . Cancer Sister        Breast- 78's   Social History:  reports that she has been smoking cigarettes. She has a 9.60 pack-year smoking history. She has never used smokeless tobacco. She reports that she does not drink alcohol and does not use drugs.  ROS: As per HPI otherwise negative.  Physical Exam: Vitals:   12/03/19 0100 12/03/19 0205 12/03/19 0208 12/03/19 0819  BP: (!) 142/66 (!) 129/58 (!) 121/45 (!) 139/56  Pulse: 71 73 70 75  Resp: 20   16  Temp: 98.3 F (36.8 C)   98.2 F (36.8 C)  TempSrc: Oral   Oral  SpO2: 98%   95%  Weight: 49.5 kg     Height: 4\' 6"  (1.372 m)        General: Well developed, well nourished, in no acute distress. Head: Normocephalic, atraumatic, sclera non-icteric, mucus membranes are moist. Neck: Supple without lymphadenopathy/masses. JVD not elevated. Lungs: Decreased breath sounds b/l bases, no wheezing/rhonch/rales auscultated.  Breathing is unlabored on O2. Heart: RRR with normal S1, S2. No murmurs, rubs, or gallops appreciated. Abdomen: Soft, non-tender, non-distended  with normoactive bowel sounds. No rebound/guarding. No obvious abdominal masses. Musculoskeletal:  R wrist slightly swollen, mildly TTP laterally Lower extremities: No edema or ischemic changes, no open wounds. Neuro: Alert and oriented X 3. Moves all extremities spontaneously. Psych:  Responds to questions appropriately with a normal affect. Dialysis Access: [name] AVF  + thrill/bruit  Allergies  Allergen Reactions  . Hydrocodone Nausea And Vomiting    Listed in Epic, but not on the [facility]   Prior to Admission medications   Medication Sig Start Date End Date Taking? Authorizing Provider  acetaminophen (TYLENOL) 325 MG tablet Take 650 mg by mouth every 4 (four) hours as needed for mild pain.   Yes [provider]  amLODipine (NORVASC) 10 MG tablet Take 10 mg by mouth daily.   Yes [provider]  aspirin 81 MG chewable tablet Chew 81 mg by mouth daily.   Yes [provider]  atorvastatin (LIPITOR) 40 MG tablet Take 40 mg by mouth at bedtime.    Yes [provider]  famotidine (PEPCID) 10 MG tablet Take 10 mg by mouth daily.   Yes [provider]  FLUoxetine (PROZAC) 10 MG capsule Take 10 mg by mouth daily.   Yes [provider]  gabapentin (NEURONTIN) 100 MG capsule Take 1 capsule (100 mg total) by mouth 2 (two) times daily. 05/04/19 12/02/19 Yes Shah, Pratik D, DO  insulin lispro (HUMALOG) 100 UNIT/ML injection Inject 12 Units into the skin 3 (three) times daily with meals. Hold for BGL <150 or if patient is not eating.   Yes [provider]  loratadine (CLARITIN) 10 MG tablet Take 10 mg by mouth daily.   Yes [provider]  metoprolol tartrate (LOPRESSOR) 100 MG tablet Take 100 mg by mouth 2 (two) times daily.   Yes [provider]  Multiple Vitamins-Minerals (CERTAVITE SENIOR/ANTIOXIDANT) TABS Take 1 tablet by mouth daily.   Yes [provider]  omeprazole (PRILOSEC) 40 MG capsule Take 40 mg by mouth  daily.   Yes [provider]  OXYCODONE HCL PO Take 2.5 mg by mouth 2 (two) times daily as needed (pain).   Yes [provider]  polyethylene glycol powder (GAVILAX) 17 GM/SCOOP powder Take 17 g by mouth See admin instructions. Mix 17 grams of powder into 8 ounces of fluid, stir, and drink (by mouth) once a day   Yes [provider]  SACCHAROMYCES BOULARDII PO Take 1 capsule by mouth 2 (two) times daily.   Yes [provider]  sevelamer carbonate (RENVELA) 800 MG tablet Take 800 mg by mouth 3 (three) times daily with meals.   Yes [provider]  diphenhydrAMINE (BANOPHEN) 25 mg capsule Take 25 mg by mouth every 4 (four) hours as needed for itching or allergies.    [provider]  senna-docusate (SENOKOT-S) 8.6-50 MG tablet Take 1 tablet by mouth at bedtime as needed for mild constipation.  06/11/18   Regalado, [name], MD  sodium bicarbonate 650 MG tablet Take 1,300 mg by mouth 2 (two) times daily.    [provider]  Vitamin D, Ergocalciferol, (DRISDOL) 50000 units CAPS capsule Take 1 capsule (50,000 Units total) by mouth every 7 (seven) days. Patient taking differently: Take 50,000 Units by mouth every Wednesday.  11/21/16   Ghimire, Henreitta Leber, MD   Current Facility-Administered Medications  Medication Dose Route Frequency Provider Last Rate Last Admin  . acetaminophen (TYLENOL) tablet 650 mg  650 mg Oral Q6H PRN Fair, Marin Shutter, MD      . aspirin chewable tablet 81 mg  81 mg Oral Daily Fair, Chelsea N, MD      . atorvastatin (LIPITOR) tablet 40 mg  40 mg Oral QHS Fair, Chelsea N, MD      . Chlorhexidine Gluconate Cloth 2 % PADS 6 each  6 each Topical Daily Fair, Chelsea N, MD      . FLUoxetine (PROZAC) capsule 10 mg  10 mg Oral Daily Fair, Chelsea N, MD      . gabapentin (NEURONTIN) capsule 100 mg  100 mg Oral BID Fair, Marin Shutter, MD      . heparin injection 5,000 Units  5,000 Units Subcutaneous Q8H Chauncey Mann, MD   5,000 Units  at 12/03/19 [phone]  . insulin aspart (novoLOG) injection 0-5 Units  0-5 Units Subcutaneous QHS Fair, Chelsea N, MD      . insulin aspart (novoLOG) injection 0-9 Units  0-9 Units Subcutaneous TID WC Fair, Chelsea N, MD      . insulin aspart (novoLOG) injection 12 Units  12 Units Subcutaneous TID WC Fair, Chelsea N, MD      . pantoprazole (PROTONIX) EC tablet 40 mg  40 mg Oral Daily Fair, Chelsea N, MD      . polyethylene glycol (MIRALAX / GLYCOLAX) packet 17 g  17 g Oral Daily Fair, Chelsea N, MD      . senna-docusate (Senokot-S) tablet 1 tablet  1 tablet Oral QHS PRN Fair, Chelsea N, MD      . sevelamer carbonate (RENVELA) tablet 800 mg  800 mg Oral TID WC Chauncey Mann, MD       Labs: Basic Metabolic Panel: Recent Labs  Lab 12/02/19 1603 12/02/19 1721 12/03/19 0329  NA 135 135 137  K 5.0 4.7 4.4  CL 96*  --  98  CO2 27  --  24  GLUCOSE 299*  --  156*  BUN 22*  --  28*  CREATININE 3.75*  --  4.31*  CALCIUM 8.7*  --  8.6*   Liver Function Tests: Recent Labs  Lab 12/02/19 1603  AST 20  ALT 22  ALKPHOS 103  BILITOT 0.5  PROT 7.5  ALBUMIN 3.6   CBC: Recent Labs  Lab 12/02/19 1603 12/02/19 1721 12/03/19 0329  WBC 7.9  --  7.7  NEUTROABS 6.2  --   --   HGB 8.8* 8.5* 8.5*  HCT 28.6* 25.0* 27.1*  MCV 96.9  --  96.4  PLT 216  --  241   CBG: Recent Labs  Lab 12/02/19 1618 12/03/19 0639 12/03/19 0758  GLUCAP 321* 175* 231*   Studies/Results: CT HEAD WO CONTRAST  Result Date: 12/02/2019 CLINICAL DATA:  Altered mental status. EXAM: CT HEAD WITHOUT CONTRAST TECHNIQUE: Contiguous axial images were obtained from the base of the skull through the vertex without intravenous contrast. COMPARISON:  CT head dated April 28, 2019. FINDINGS: Brain: No  evidence of acute infarction, hemorrhage, hydrocephalus, extra-axial collection or mass lesion/mass effect. Small bilateral basal ganglia chronic lacunar infarcts again noted. Stable mild atrophy and chronic microvascular ischemic  changes. Vascular: Calcified atherosclerosis at the skullbase. No hyperdense vessel. Skull: Normal. Negative for fracture or focal lesion. Sinuses/Orbits: No acute finding. Chronic small vitreous hemorrhage in the posterior left globe. Other: None. IMPRESSION: 1. No acute intracranial abnormality. Electronically Signed   By: Titus Dubin M.D.   On: 12/02/2019 18:56   CT Angio Chest PE W and/or Wo Contrast  Result Date: 12/02/2019 CLINICAL DATA:  Shortness of breath.  Altered mental status. EXAM: CT ANGIOGRAPHY CHEST WITH CONTRAST TECHNIQUE: Multidetector CT imaging of the chest was performed using the standard protocol during bolus administration of intravenous contrast. Multiplanar CT image reconstructions and MIPs were obtained to evaluate the vascular anatomy. CONTRAST:  64mL OMNIPAQUE IOHEXOL 350 MG/ML SOLN COMPARISON:  Radiograph earlier this day. FINDINGS: Cardiovascular: There are no filling defects within the pulmonary arteries to suggest pulmonary embolus. Aortic atherosclerosis. No aneurysm. Common origin of the brachiocephalic and left common carotid artery, variant arch anatomy. Mild multi chamber cardiomegaly. There is contrast refluxing into the hepatic veins and IVC. No pericardial effusion. Mediastinum/Nodes: No enlarged mediastinal or hilar lymph nodes. No thyroid nodule. Patulous esophagus with small hiatal hernia. No definite wall thickening. Lungs/Pleura: Dependent atelectasis. Subsegmental atelectasis in the lingula. No septal thickening or pulmonary edema. No pleural effusion. Trachea and mainstem bronchi are patent. Upper Abdomen: Contrast refluxing into the hepatic veins and IVC. No other acute findings. Musculoskeletal: There are no acute or suspicious osseous abnormalities. Review of the MIP images confirms the above findings. IMPRESSION: 1. No pulmonary embolus. 2. Mild multi chamber cardiomegaly with contrast refluxing into the hepatic veins and IVC consistent with elevated right  heart pressures. 3. Patulous esophagus with small hiatal hernia. Aortic Atherosclerosis (ICD10-I70.0). Electronically Signed   By: Keith Rake M.D.   On: 12/02/2019 23:42   DG Chest Port 1 View  Result Date: 12/02/2019 CLINICAL DATA:  Altered level of consciousness. EXAM: PORTABLE CHEST 1 VIEW COMPARISON:  04/28/2019 FINDINGS: Cardiomegaly. Unchanged mediastinal contours with aortic atherosclerosis. Previous right pleural effusion is diminished and is likely resolved. Bronchovascular crowding versus vascular congestion. No focal airspace disease or pneumothorax. No acute osseous abnormalities are seen IMPRESSION: Cardiomegaly. Bronchovascular crowding versus vascular congestion. Electronically Signed   By: Keith Rake M.D.   On: 12/02/2019 16:28    Dialysis Orders:  Center: [facility]  on TTS. 3 hours 45 min, 180NRe, 2K, 2.25Ca, AVF, BFR 350, DFR 800, UFR profile- none, sodium modeling-none hectorol 6 mcg IVP q HD Heparin 2000 unit bolus mircera 60 mcg IVP q HD- last dose 50 mcg on 5/25, Hgb 8.7 on 12/01/19  Assessment/Plan: 1.  Syncope/AMS: BP slightly low on admission with hypoxia, CXR with vascular congestion. Alert and oriented this AM. She has had AMS with hypoxia in the past. Will plan for HD today for volume management. Also recently started moxifloxacin and oxycodone at SNF, which may have been for UTI and wrist pain respectively per pt report. UA on 12/02/19 with high glucose but otherwise not suspicious for a UTI. Her left wrist is slightly swollen today. Will check an x-ray. Defer management to primary.  2.  ESRD:  TTS schedule, missed HD 6/8. Will plan HD this afternoon for volume mgt as above.  3.  Hypertension/volume: BP low on admission, now high/normal. UF goal 2.5-3L with HD today. 4.  Anemia: Hgb 8.5,  consistent with recent outpatient value. ESA recently increased and over 2 weeks since her last dose. Will order ESA with HD today.  5.  Metabolic bone  disease: calcium controlled,  Recent outpatient phos 4.5. Continue hectorol and renvela.  6.  Nutrition:  Alb 3.6. Renal diet with fluid restrictions.  Anice Paganini, PA-C 12/03/2019, 8:31 AM  [facility] Pager: [phone]  Nephrology attending: Patient was seen and examined.  Chart reviewed.  I agree with the consult note including assessment and plan as outlined above. 59 year old female ESRD on HD TTS admitted with change in mental status and syncopal episode.  She missed 1 treatment last week. Currently feels good without chest pain, shortness of breath, nausea or vomiting.  She has AV fistula for the access. CT chest ruled out PE.  Now for syncope work-up/evaluation. We will plan for HD today.  Continue ESA, Hectorol.  UF as tolerated.  Katheran James, MD [facility].

## 2019-12-03 NOTE — Progress Notes (Addendum)
PROGRESS NOTE    [name] Michael  YIF:[id number] DOB: 1961-03-20 DOA: 12/02/2019 PCP: Charlott Rakes, MD    Chief Complaint  Patient presents with  . Altered Mental Status    Brief Narrative:  HPI per Dr. [name] is a 59 y.o. female with PMH of HTN, HLD, DM, ESRD, MDD who presented for AMS and admitted for hypoxia and syncope workup.  Patient reports that she was at her nursing facility today and was doing her laundry and started to feel weak so she went to the dining room and sat down and per her reports, she passed out. She is unsure how long this happened. Per nursing home report, she was brought to ER for AMS. Patient reports starting yesterday she was given Moxifloxacin and Oxycodone which are new meds and she is unsure why. Denies other complaints. Denies headache, dizziness, fever, chills, cough, SOB, chest pain, abdominal pain, nausea, vomiting, diarrhea, constipation, dysuria, hematuria, hematochezia, melena, difficulty moving arms/legs, speech difficulty, trouble eating, confusion or any other complaints.  In the ED: Slightly hypotensive and temp 95.46F otherwise stable on room air. Labs remarkable for glucose 299, Cr 3.75, Hgb 8.8, WBC 7.9, Ethanol level and UDS WNL, Lactate 2.3, UA without evidence of infection, COVID neg.  CXR: Cardiomegaly. Bronchovascular crowding versus vascular congestion.  CT Head and CTA Chest: Non-acute  EDP requested admission for hypoxia.  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  Syncope/??  Hypoxia, POA Patient presented from nursing facility with generalized weakness and concern that she may have passed  out as well as altered mental status.  Patient noted to have recently been given antibiotics due to concern for possible UTI and oxycodone for right wrist pain after a fall.  On presentation to the ED patient noted to be slightly hypotensive with some hypothermia.  Chest x-ray with vascular congestion.  CT angiogram chest negative for PE, mild multichamber cardiomegaly with contrast refluxing into the hepatic veins and IVC consistent with elevated right heart pressures.  Patulous esophagus with small hiatal hernia.  CT had negative for any acute abnormalities.  Patient noted to have a prior history of altered mental status with hypoxia in the past felt likely secondary to volume overload.  Nephrology consulted and patient for probable hemodialysis today for volume management.  2.  End-stage renal disease On Tuesday Thursday Saturday schedule.  Patient noted to have missed hemodialysis on 11/29/2019.  Nephrology consulted and patient being set up for hemodialysis today.  3.  Well-controlled diabetes mellitus type 2 Hemoglobin A1c 6.5 on 04/28/2019.  Repeat hemoglobin A1c at 8.8 (12/03/2019).  CBG 231 this morning.  Sliding scale insulin, new coverage insulin 12 units 3 times daily with meals.  Continue Neurontin.  4.  Gastroesophageal reflux disease PPI.  5.  Hyperlipidemia Continue statin.  6.  Hypertension Blood pressure noted to be borderline on admission.  Continue to hold Lopressor and amlodipine.  Follow.  7.  Depression Prozac.  8.  Right wrist pain Mechanical fall.  Plain films of the right wrist ordered and pending.   DVT prophylaxis: Heparin Code Status: Full Family Communication: Updated patient.  No family at  bedside. Disposition:   Status is: Inpatient    Dispo: The patient is from: SNF              Anticipated d/c is to:SNF              Anticipated d/c date is: To be determined.              Patient currently volume overloaded presenting with shortness of breath and  hypoxia likely requiring hemodialysis.       Consultants:   Nephrology: Dr. Carolin Sicks 12/03/2019  Procedures:   Plain films of the right wrist pending  CT angiogram chest 12/02/2019  CT head without contrast 12/02/2019  Antimicrobials:   None   Subjective: Sleeping but arousable.  Complaining of right wrist pain that she stated happened after she sustained a fall after slipping on rain.  Denies any chest pain.  States some improvement with shortness of breath.  Objective: Vitals:   12/03/19 0100 12/03/19 0205 12/03/19 0208 12/03/19 0819  BP: (!) 142/66 (!) 129/58 (!) 121/45 (!) 139/56  Pulse: 71 73 70 75  Resp: 20   16  Temp: 98.3 F (36.8 C)   98.2 F (36.8 C)  TempSrc: Oral   Oral  SpO2: 98%   95%  Weight: 49.5 kg     Height: 4\' 6"  (1.372 m)       Intake/Output Summary (Last 24 hours) at 12/03/2019 1533 Last data filed at 12/03/2019 0100 Gross per 24 hour  Intake --  Output 150 ml  Net -150 ml   Filed Weights   12/03/19 0100  Weight: 49.5 kg    Examination:  General exam: Appears calm and comfortable  Respiratory system: Decreased breath sounds in the bases.  No wheezing.  Speaking in full sentences.  Normal respiratory effort.  Cardiovascular system: S1 & S2 heard, RRR. No JVD, murmurs, rubs, gallops or clicks. No pedal edema. Gastrointestinal system: Abdomen is nondistended, soft and nontender. No organomegaly or masses felt. Normal bowel sounds heard. Central nervous system: Alert and oriented. No focal neurological deficits. Extremities: Symmetric 5 x 5 power.  Left upper extremity AV fistula with thrill/bruit.  Right wrist slightly swollen with some tenderness to palpation. Skin: No rashes, lesions or ulcers Psychiatry: Judgement and insight appear normal. Mood & affect appropriate.     Data Reviewed: I have personally reviewed following labs and imaging studies  CBC: Recent Labs  Lab 12/02/19 1603 12/02/19 1721 12/03/19 0329  WBC 7.9  --   7.7  NEUTROABS 6.2  --   --   HGB 8.8* 8.5* 8.5*  HCT 28.6* 25.0* 27.1*  MCV 96.9  --  96.4  PLT 216  --  662    Basic Metabolic Panel: Recent Labs  Lab 12/02/19 1603 12/02/19 1721 12/03/19 0329  NA 135 135 137  K 5.0 4.7 4.4  CL 96*  --  98  CO2 27  --  24  GLUCOSE 299*  --  156*  BUN 22*  --  28*  CREATININE 3.75*  --  4.31*  CALCIUM 8.7*  --  8.6*    GFR: Estimated Creatinine Clearance: 8.6 mL/min (A) (by C-G formula based on SCr of 4.31 mg/dL (H)).  Liver Function Tests: Recent Labs  Lab 12/02/19 1603  AST 20  ALT 22  ALKPHOS 103  BILITOT 0.5  PROT 7.5  ALBUMIN 3.6    CBG: Recent Labs  Lab 12/03/19 0639 12/03/19 0758 12/03/19 0926 12/03/19 1138 12/03/19 1336  GLUCAP 175*  231* 238* 227* 202*     Recent Results (from the past 240 hour(s))  Blood Cultures (routine x 2)     Status: None (Preliminary result)   Collection Time: 12/02/19  3:46 PM   Specimen: BLOOD RIGHT HAND  Result Value Ref Range Status   Specimen Description BLOOD RIGHT HAND  Final   Special Requests   Final    BOTTLES DRAWN AEROBIC ONLY Blood Culture results may not be optimal due to an inadequate volume of blood received in culture bottles   Culture   Final    NO GROWTH < 12 HOURS Performed at [facility], [place] [address] [place], [place] [address]    Report Status PENDING  Incomplete  Blood Cultures (routine x 2)     Status: None (Preliminary result)   Collection Time: 12/02/19  4:02 PM   Specimen: BLOOD  Result Value Ref Range Status   Specimen Description BLOOD RIGHT ANTECUBITAL  Final   Special Requests   Final    BOTTLES DRAWN AEROBIC AND ANAEROBIC Blood Culture adequate volume   Culture   Final    NO GROWTH < 24 HOURS Performed at [facility], [place] [address] [place], [place] [address]    Report Status PENDING  Incomplete  SARS Coronavirus 2 by RT PCR (hospital order, performed in [facility] hospital lab) Nasopharyngeal Nasopharyngeal Swab     Status:  None   Collection Time: 12/02/19  5:00 PM   Specimen: Nasopharyngeal Swab  Result Value Ref Range Status   SARS Coronavirus 2 NEGATIVE NEGATIVE Final    Comment: (NOTE) SARS-CoV-2 target nucleic acids are NOT DETECTED.  The SARS-CoV-2 RNA is generally detectable in upper and lower respiratory specimens during the acute phase of infection. The lowest concentration of SARS-CoV-2 viral copies this assay can detect is 250 copies / mL. A negative result does not preclude SARS-CoV-2 infection and should not be used as the sole basis for treatment or other patient management decisions.  A negative result may occur with improper specimen collection / handling, submission of specimen other than nasopharyngeal swab, presence of viral mutation(s) within the areas targeted by this assay, and inadequate number of viral copies (<250 copies / mL). A negative result must be combined with clinical observations, patient history, and epidemiological information.  Fact Sheet for Patients:   StrictlyIdeas.no  Fact Sheet for Healthcare Providers: BankingDealers.co.za  This test is not yet approved or  cleared by the [place] FDA and has been authorized for detection and/or diagnosis of SARS-CoV-2 by FDA under an Emergency Use Authorization (EUA).  This EUA will remain in effect (meaning this test can be used) for the duration of the COVID-19 declaration under Section 564(b)(1) of the Act, 21 U.S.C. section 360bbb-3(b)(1), unless the authorization is terminated or revoked sooner.  Performed at [facility], [place] [address] [place], [place] [address]          Radiology Studies: DG Wrist Complete Right  Result Date: 12/03/2019 CLINICAL DATA:  Fall 1 week ago with right wrist pain. EXAM: RIGHT WRIST - COMPLETE 3+ VIEW COMPARISON:  08/31/2018 FINDINGS: Mild degenerate changes of the radiocarpal joint and radial side of the carpal bones/first  carpometacarpal joint. Evidence of patient's old distal radial and ulnar metaphyseal fractures. Subtle lucency at the base of the ulnar styloid which may be acute or chronic. IMPRESSION: Lucency at the base of the ulnar styloid which may be acute or chronic. Evidence of old distal radial/ulnar  metaphyseal fractures. Electronically Signed   By: Marin Olp M.D.   On: 12/03/2019 11:49   CT HEAD WO CONTRAST  Result Date: 12/02/2019 CLINICAL DATA:  Altered mental status. EXAM: CT HEAD WITHOUT CONTRAST TECHNIQUE: Contiguous axial images were obtained from the base of the skull through the vertex without intravenous contrast. COMPARISON:  CT head dated April 28, 2019. FINDINGS: Brain: No evidence of acute infarction, hemorrhage, hydrocephalus, extra-axial collection or mass lesion/mass effect. Small bilateral basal ganglia chronic lacunar infarcts again noted. Stable mild atrophy and chronic microvascular ischemic changes. Vascular: Calcified atherosclerosis at the skullbase. No hyperdense vessel. Skull: Normal. Negative for fracture or focal lesion. Sinuses/Orbits: No acute finding. Chronic small vitreous hemorrhage in the posterior left globe. Other: None. IMPRESSION: 1. No acute intracranial abnormality. Electronically Signed   By: Titus Dubin M.D.   On: 12/02/2019 18:56   CT Angio Chest PE W and/or Wo Contrast  Result Date: 12/02/2019 CLINICAL DATA:  Shortness of breath.  Altered mental status. EXAM: CT ANGIOGRAPHY CHEST WITH CONTRAST TECHNIQUE: Multidetector CT imaging of the chest was performed using the standard protocol during bolus administration of intravenous contrast. Multiplanar CT image reconstructions and MIPs were obtained to evaluate the vascular anatomy. CONTRAST:  71mL OMNIPAQUE IOHEXOL 350 MG/ML SOLN COMPARISON:  Radiograph earlier this day. FINDINGS: Cardiovascular: There are no filling defects within the pulmonary arteries to suggest pulmonary embolus. Aortic atherosclerosis. No  aneurysm. Common origin of the brachiocephalic and left common carotid artery, variant arch anatomy. Mild multi chamber cardiomegaly. There is contrast refluxing into the hepatic veins and IVC. No pericardial effusion. Mediastinum/Nodes: No enlarged mediastinal or hilar lymph nodes. No thyroid nodule. Patulous esophagus with small hiatal hernia. No definite wall thickening. Lungs/Pleura: Dependent atelectasis. Subsegmental atelectasis in the lingula. No septal thickening or pulmonary edema. No pleural effusion. Trachea and mainstem bronchi are patent. Upper Abdomen: Contrast refluxing into the hepatic veins and IVC. No other acute findings. Musculoskeletal: There are no acute or suspicious osseous abnormalities. Review of the MIP images confirms the above findings. IMPRESSION: 1. No pulmonary embolus. 2. Mild multi chamber cardiomegaly with contrast refluxing into the hepatic veins and IVC consistent with elevated right heart pressures. 3. Patulous esophagus with small hiatal hernia. Aortic Atherosclerosis (ICD10-I70.0). Electronically Signed   By: Keith Rake M.D.   On: 12/02/2019 23:42   DG Chest Port 1 View  Result Date: 12/02/2019 CLINICAL DATA:  Altered level of consciousness. EXAM: PORTABLE CHEST 1 VIEW COMPARISON:  04/28/2019 FINDINGS: Cardiomegaly. Unchanged mediastinal contours with aortic atherosclerosis. Previous right pleural effusion is diminished and is likely resolved. Bronchovascular crowding versus vascular congestion. No focal airspace disease or pneumothorax. No acute osseous abnormalities are seen IMPRESSION: Cardiomegaly. Bronchovascular crowding versus vascular congestion. Electronically Signed   By: Keith Rake M.D.   On: 12/02/2019 16:28        Scheduled Meds: . aspirin  81 mg Oral Daily  . atorvastatin  40 mg Oral QHS  . Chlorhexidine Gluconate Cloth  6 each Topical Daily  . Chlorhexidine Gluconate Cloth  6 each Topical Q0600  . darbepoetin (ARANESP) injection -  DIALYSIS  40 mcg Intravenous Q Sat-HD  . doxercalciferol  6 mcg Intravenous Q T,Th,Sa-HD  . FLUoxetine  10 mg Oral Daily  . gabapentin  100 mg Oral BID  . heparin  5,000 Units Subcutaneous Q8H  . insulin aspart  0-5 Units Subcutaneous QHS  . insulin aspart  0-9 Units Subcutaneous TID WC  . insulin aspart  12 Units Subcutaneous TID WC  .  pantoprazole  40 mg Oral Daily  . polyethylene glycol  17 g Oral Daily  . sevelamer carbonate  800 mg Oral TID WC   Continuous Infusions:   LOS: 0 days    Time spent: 40 minutes    Irine Seal, MD [facility]   To contact the attending provider between 7A-7P or the covering provider during after hours 7P-7A, please log into the web site [email or web address] and access using universal [place] password for that web site. If you do not have the password, please call the hospital operator.  12/03/2019, 3:33 PM

## 2019-12-03 NOTE — TOC Initial Note (Signed)
Transition of Care [facility]) - Initial/Assessment Note    Patient Details  Name: [name] Michael MRN: [id number] Date of Birth: 11/13/1960  [facility] CM/SW Contact:    Trula Ore, [place] Phone Number: 12/03/2019, 4:13 PM  Clinical Narrative:                  CSW spoke with patient at bedside. Patient is LTC at [facility]. Patient was agreeable to SNF at [facility].  [place] confirmed that patient is LTC at facility. Plan is for patient to go back to [facility] when medically ready for discharge.  SNF bed at [facility] Covid is good through the 14th.  TOC team will continue to follow.  Expected Discharge Plan: Skilled Nursing Facility Barriers to Discharge: Continued Medical Work up   Patient Goals and CMS Choice Patient states their goals for this hospitalization and ongoing recovery are:: SNF CMS Medicare.gov Compare Post Acute Care list provided to:: Patient Choice offered to / list presented to : Patient  Expected Discharge Plan and Services Expected Discharge Plan: [place] arrangements for the past 2 months: [place]                                      Prior Living Arrangements/Services Living arrangements for the past 2 months: [place] Lives with:: Facility Resident, Self Patient language and need for interpreter reviewed:: Yes Do you feel safe going back to the place where you live?: Yes      Need for Family Participation in Patient Care: Yes (Comment) Care giver support system in place?: Yes (comment)   Criminal Activity/Legal Involvement Pertinent to Current Situation/Hospitalization: No - Comment as needed  Activities of Daily Living      Permission Sought/Granted Permission sought to share information with : Case Manager, Customer service manager, Family Supports Permission granted to share information with : Yes, Verbal Permission Granted  Share  Information with NAME: [name]  Permission granted to share info w AGENCY: SNF  Permission granted to share info w Relationship: Sister  Permission granted to share info w Contact Information: [name] [phone]  Emotional Assessment Appearance:: Appears stated age Attitude/Demeanor/Rapport: Gracious Affect (typically observed): Calm Orientation: : Oriented to Self, Oriented to Place, Oriented to  Time Alcohol / Substance Use: Not Applicable Psych Involvement: No (comment)  Admission diagnosis:  Transient alteration of awareness [R40.4] Syncope [R55] Hypoxia [R09.02] Patient Active Problem List   Diagnosis Date Noted  . Syncope 12/03/2019  . Wrist pain, acute, right   . 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 AMS (altered mental status) 04/28/2019  . Hypoxemia 04/28/2019  . Fall 06/09/2018  . Fall at home, initial encounter 06/09/2018  . Anemia of chronic disease 06/09/2018  . Syncope and collapse 06/09/2018  . CKD (chronic kidney disease) stage 5, GFR less than 15 ml/min (HCC) 03/10/2018  . Acute encephalopathy 03/10/2018  . Hypermagnesemia 03/10/2018  . AKI (acute kidney injury) ([place]) 11/09/2016  . Hypercalcemia 11/09/2016  . Hyponatremia 11/09/2016  . Hypovolemia 11/09/2016  . Accelerated hypertension 11/09/2016  . Acute lower UTI 11/09/2016  . Nephrolithiasis 10/24/2016  . Constipation 10/22/2016  . CAP (community acquired pneumonia) 10/22/2016  . Hydronephrosis of right kidney 10/22/2016  . Metabolic acidosis 62/13/0865  . CVA (cerebral vascular accident) ([place]) 07/03/2016  . HAP (hospital-acquired pneumonia) 07/03/2016  .  Sepsis secondary to UTI ([place])   . UTI due to extended-spectrum beta lactamase (ESBL) producing Escherichia coli   . Acute pyelonephritis   . Bacteremia due to Escherichia coli   . Colitis, indeterminate   . Uncontrolled type 2 diabetes mellitus with complication ([place])   . 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 Chronic diastolic CHF (congestive heart failure) ([place])   . Abdominal pain   . Colitis   . Hypophosphatemia 06/12/2016  . Hypokalemia 06/11/2016  . Hypocalcemia 06/11/2016  . Acute kidney injury superimposed on chronic kidney disease ([place]) 06/11/2016  . Proliferative diabetic retinopathy ([place]) 04/29/2016  . Closed fracture of left distal radius 01/28/2016  . Poor social situation 03/09/2013  . Depression 03/01/2013  . Abnormal mammogram 12/20/2012  . Retinal detachment 11/17/2012  . 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) 49/67/5916  . Hyperlipidemia 02/09/2007  . Essential hypertension 02/09/2007  . GERD 02/09/2007   PCP:  Charlott Rakes, MD Pharmacy:   [facility], [place] [address] Phone: [phone] Fax: [phone]     Social Determinants of Health (SDOH) Interventions    Readmission Risk Interventions Readmission Risk Prevention Plan 04/29/2019  Transportation Screening Complete  PCP or Specialist Appt within 3-5 Days Complete  HRI or [place] Complete  Social Work Consult for [place] Planning/Counseling Complete  Palliative Care Screening Not Applicable  Medication Review Press photographer) Referral to Pharmacy  Some recent data might be hidden

## 2019-12-03 NOTE — [place] FL2 (Signed)
[place] MEDICAID FL2 LEVEL OF CARE SCREENING TOOL     IDENTIFICATION  Patient Name: [name] Michael Birthdate: 01/11/1961 Sex: female Admission Date (Current Location): 12/02/2019  [facility] [place] Number:  Herbalist and Address:  [facility], [place] [address], [place], [place] [address]      Provider Number: [id number]  Attending Physician Name and Address:  Eugenie Filler, MD  Relative Name and Phone Number:  [name] [phone]    Current Level of Care: Hospital Recommended Level of Care: [place] Prior Approval Number:    Date Approved/Denied:   PASRR Number: [id number]  Discharge Plan: SNF    Current Diagnoses: Patient Active Problem List   Diagnosis Date Noted  . Syncope 12/03/2019  . Wrist pain, acute, right   . 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 AMS (altered mental status) 04/28/2019  . Hypoxemia 04/28/2019  . Fall 06/09/2018  . Fall at home, initial encounter 06/09/2018  . Anemia of chronic disease 06/09/2018  . Syncope and collapse 06/09/2018  . CKD (chronic kidney disease) stage 5, GFR less than 15 ml/min (HCC) 03/10/2018  . Acute encephalopathy 03/10/2018  . Hypermagnesemia 03/10/2018  . AKI (acute kidney injury) ([place]) 11/09/2016  . Hypercalcemia 11/09/2016  . Hyponatremia 11/09/2016  . Hypovolemia 11/09/2016  . Accelerated hypertension 11/09/2016  . Acute lower UTI 11/09/2016  . Nephrolithiasis 10/24/2016  . Constipation 10/22/2016  . CAP (community acquired pneumonia) 10/22/2016  . Hydronephrosis of right kidney 10/22/2016  . Metabolic acidosis 56/38/9373  . CVA (cerebral vascular accident) ([place]) 07/03/2016  . HAP (hospital-acquired pneumonia) 07/03/2016  . Sepsis secondary to UTI ([place])   . UTI due to extended-spectrum beta lactamase (ESBL) producing Escherichia coli   . Acute pyelonephritis   . Bacteremia due to Escherichia coli   . Colitis, indeterminate   . Uncontrolled type  2 diabetes mellitus with complication ([place])   . 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 associated with diabetes mellitus due to underlying condition ([place])   . Chronic diastolic CHF (congestive heart failure) ([place])   . Abdominal pain   . Colitis   . Hypophosphatemia 06/12/2016  . Hypokalemia 06/11/2016  . Hypocalcemia 06/11/2016  . Acute kidney injury superimposed on chronic kidney disease ([place]) 06/11/2016  . Proliferative diabetic retinopathy ([place]) 04/29/2016  . Closed fracture of left distal radius 01/28/2016  . Poor social situation 03/09/2013  . Depression 03/01/2013  . Abnormal mammogram 12/20/2012  . Retinal detachment 11/17/2012  . 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 ([place]) 42/87/6811  . Hyperlipidemia 02/09/2007  . Essential hypertension 02/09/2007  . GERD 02/09/2007    Orientation RESPIRATION BLADDER Height & Weight     Self, Time, Place  O2 (Nasal Cannula 2 liters) Incontinent, External catheter (External Urinary Catheter) Weight: 109 lb 2 oz (49.5 kg) Height:  4\' 6"  (137.2 cm)  BEHAVIORAL SYMPTOMS/MOOD NEUROLOGICAL BOWEL NUTRITION STATUS      Continent Diet (See Discharge Summary)  AMBULATORY STATUS COMMUNICATION OF NEEDS Skin   Limited Assist Verbally Skin abrasions, Other (Comment) (Appropriate for ethnicity,dry,flaky, ecchymosis Abdomen right lateral skin-turgor non-tenting)                       Personal Care Assistance Level of Assistance  Bathing, Feeding, Dressing Bathing Assistance: Limited assistance Feeding assistance: Independent (Able to feed self- Diet-Carb modified cardiac) Dressing Assistance: Limited assistance     Functional Limitations Info  Sight, Hearing, Speech Sight  Info: Impaired (Blind left) Hearing Info: Adequate Speech Info: Adequate    SPECIAL CARE FACTORS FREQUENCY  PT (By licensed PT), OT (By licensed OT)     PT Frequency: 5x min weekly OT Frequency: 5x min weekly             Contractures Contractures Info: Not present    Additional Factors Info  Code Status, Allergies Code Status Info: FULL Allergies Info: Hydrocodone           Current Medications (12/03/2019):  This is the current hospital active medication list Current Facility-Administered Medications  Medication Dose Route Frequency Provider Last Rate Last Admin  . acetaminophen (TYLENOL) tablet 650 mg  650 mg Oral Q6H PRN Fair, Chelsea N, MD      . aspirin chewable tablet 81 mg  81 mg Oral Daily Fair, Marin Shutter, MD   81 mg at 12/03/19 1509  . atorvastatin (LIPITOR) tablet 40 mg  40 mg Oral QHS Fair, Chelsea N, MD      . Chlorhexidine Gluconate Cloth 2 % PADS 6 each  6 each Topical Daily Fair, Marin Shutter, MD      . Chlorhexidine Gluconate Cloth 2 % PADS 6 each  6 each Topical Q0600 Janalee Dane, PA-C      . Darbepoetin Alfa (ARANESP) injection 40 mcg  40 mcg Intravenous Q Sat-HD Collins, Samantha G, PA-C      . doxercalciferol (HECTOROL) injection 6 mcg  6 mcg Intravenous Q T,Th,Sa-HD Collins, Hervey Ard, PA-C      . FLUoxetine (PROZAC) capsule 10 mg  10 mg Oral Daily Fair, Marin Shutter, MD   10 mg at 12/03/19 1509  . gabapentin (NEURONTIN) capsule 100 mg  100 mg Oral BID Chauncey Mann, MD   100 mg at 12/03/19 0928  . heparin injection 5,000 Units  5,000 Units Subcutaneous Q8H Fair, Marin Shutter, MD   5,000 Units at 12/03/19 1501  . insulin aspart (novoLOG) injection 0-5 Units  0-5 Units Subcutaneous QHS Fair, Chelsea N, MD      . insulin aspart (novoLOG) injection 0-9 Units  0-9 Units Subcutaneous TID WC Chauncey Mann, MD   3 Units at 12/03/19 1340  . insulin aspart (novoLOG) injection 12 Units  12 Units Subcutaneous TID WC Chauncey Mann, MD   12 Units at 12/03/19 0934  . pantoprazole (PROTONIX) EC tablet 40 mg  40 mg Oral Daily Fair, Marin Shutter, MD   40 mg at 12/03/19 1508  . polyethylene glycol (MIRALAX / GLYCOLAX) packet 17 g  17 g Oral Daily Chauncey Mann, MD   17 g at 12/03/19 1508  .  senna-docusate (Senokot-S) tablet 1 tablet  1 tablet Oral QHS PRN Fair, Chelsea N, MD      . sevelamer carbonate (RENVELA) tablet 800 mg  800 mg Oral TID WC Fair, Marin Shutter, MD   800 mg at 12/03/19 1508     Discharge Medications: Please see discharge summary for a list of discharge medications.  Relevant Imaging Results:  Relevant Lab Results:   Additional Information [id number]  Trula Ore, LCSWA

## 2019-12-03 NOTE — Evaluation (Signed)
Physical Therapy Evaluation Patient Details Name: [name] Michael MRN: [id number] DOB: 03/27/1961 Today's Date: 12/03/2019   History of Present Illness  59 y.o. female with PMH of HTN, HLD, DM, ESRD dialysis TTS, and depression who presented to ED 12/02/19 for AMS and admitted for hypoxia and syncope workup. CT head and CTA chest no acute changes.   Clinical Impression   Pt admitted with above diagnosis. Patient with recent fall with rt 4th finger fracture which made it difficult for her to hold onto RW. She maintained her balance with RW x 25 ft with assist only to maneuver the RW. She reports she uses a cane at baseline (yet also had recent fall). Feel she could benefit from at least a short-course of therapy to assess safety with various assistive devices.  Pt currently with functional limitations due to the deficits listed below (see PT Problem List). Pt will benefit from skilled PT to increase their independence and safety with mobility to allow discharge to the venue listed below.       Follow Up Recommendations SNF (vs return to LTC--unclear what her recent status was)    Equipment Recommendations  None recommended by PT    Recommendations for Other Services OT consult     Precautions / Restrictions Precautions Precautions: Fall Precaution Comments: had recent fall vs ?syncope with rt 4th finger fracture      Mobility  Bed Mobility Overal bed mobility: Modified Independent             General bed mobility comments: HOB 30, rail  Transfers Overall transfer level: Needs assistance Equipment used: Rolling walker (2 wheeled) Transfers: Sit to/from Stand Sit to Stand: Min assist         General transfer comment: assist to stabilize RW as pt puts hands on RW to come to stand  Ambulation/Gait Ambulation/Gait assistance: Min assist Gait Distance (Feet): 25 Feet Assistive device: Rolling walker (2 wheeled) Gait Pattern/deviations: Step-through pattern;Decreased stride  length;Trunk flexed     General Gait Details: difficulty maneuvering RW due to rt hand pain; sats stable on 2L [place] O2  Stairs            Wheelchair Mobility    Modified Rankin (Stroke Patients Only)       Balance Overall balance assessment: History of Falls                                           Pertinent Vitals/Pain Pain Assessment: Faces Faces Pain Scale: Hurts little more Pain Location: when trying to open containers for lunch that requires both hands Pain Descriptors / Indicators: Discomfort Pain Intervention(s): Limited activity within patient's tolerance;Monitored during session;Other (comment) (assist provided)    Home Living Family/patient expects to be discharged to:: Unsure                 Additional Comments: Per pt from [facility]--?SNF vs LTC level of care    Prior Function Level of Independence: Needs assistance   Gait / Transfers Assistance Needed: reports utilizing a cane           Hand Dominance   Dominant Hand: Right    Extremity/Trunk Assessment   Upper Extremity Assessment Upper Extremity Assessment:  (rt 4th finger fracture)    Lower Extremity Assessment Lower Extremity Assessment: Generalized weakness    Cervical / Trunk Assessment Cervical / Trunk Assessment: Normal  Communication  Communication: No difficulties  Cognition Arousal/Alertness: Awake/alert Behavior During Therapy: Flat affect Overall Cognitive Status: No family/caregiver present to determine baseline cognitive functioning                                 General Comments: Slow processing      General Comments General comments (skin integrity, edema, etc.): Patient reports she "had finished with therapy" at [facility]. ?If long-term care resident     Exercises     Assessment/Plan    PT Assessment Patient needs continued PT services  PT Problem List Decreased strength;Decreased balance;Decreased  mobility;Decreased cognition;Decreased knowledge of use of DME;Decreased safety awareness;Decreased knowledge of precautions;Cardiopulmonary status limiting activity;Pain       PT Treatment Interventions DME instruction;Gait training;Functional mobility training;Therapeutic activities;Therapeutic exercise;Patient/family education;Cognitive remediation    PT Goals (Current goals can be found in the Care Plan section)  Acute Rehab PT Goals Patient Stated Goal: for her hand to stop hurting PT Goal Formulation: With patient Time For Goal Achievement: 12/17/19 Potential to Achieve Goals: Good    Frequency Min 2X/week   Barriers to discharge        Co-evaluation               AM-PAC PT "6 Clicks" Mobility  Outcome Measure Help needed turning from your back to your side while in a flat bed without using bedrails?: None Help needed moving from lying on your back to sitting on the side of a flat bed without using bedrails?: A Little Help needed moving to and from a bed to a chair (including a wheelchair)?: A Little Help needed standing up from a chair using your arms (e.g., wheelchair or bedside chair)?: A Little Help needed to walk in hospital room?: A Little Help needed climbing 3-5 steps with a railing? : A Lot 6 Click Score: 18    End of Session Equipment Utilized During Treatment: Oxygen;Gait belt Activity Tolerance: Patient tolerated treatment well Patient left: in chair;with call bell/phone within reach;with chair alarm set Nurse Communication: Mobility status PT Visit Diagnosis: Difficulty in walking, not elsewhere classified (R26.2);Pain Pain - Right/Left: Right Pain - part of body: Hand    Time: 1416-1446 PT Time Calculation (min) (ACUTE ONLY): 30 min   Charges:   PT Evaluation $PT Eval Low Complexity: 1 Low PT Treatments $Gait Training: 8-22 mins         Arby Barrette, PT Pager [phone]   Rexanne Mano 12/03/2019, 3:13 PM

## 2019-12-04 ENCOUNTER — Encounter (HOSPITAL_COMMUNITY): Payer: Self-pay | Admitting: Family Medicine

## 2019-12-04 DIAGNOSIS — I071 Rheumatic tricuspid insufficiency: Secondary | ICD-10-CM | POA: Diagnosis present

## 2019-12-04 DIAGNOSIS — E119 Type 2 diabetes mellitus without complications: Secondary | ICD-10-CM

## 2019-12-04 DIAGNOSIS — K219 Gastro-esophageal reflux disease without esophagitis: Secondary | ICD-10-CM | POA: Diagnosis present

## 2019-12-04 DIAGNOSIS — R531 Weakness: Secondary | ICD-10-CM

## 2019-12-04 DIAGNOSIS — Z992 Dependence on renal dialysis: Secondary | ICD-10-CM | POA: Diagnosis present

## 2019-12-04 DIAGNOSIS — I10 Essential (primary) hypertension: Secondary | ICD-10-CM | POA: Diagnosis present

## 2019-12-04 DIAGNOSIS — I361 Nonrheumatic tricuspid (valve) insufficiency: Secondary | ICD-10-CM

## 2019-12-04 DIAGNOSIS — N186 End stage renal disease: Secondary | ICD-10-CM | POA: Diagnosis present

## 2019-12-04 LAB — CBC
HCT: 28.2 % — ABNORMAL LOW (ref 36.0–46.0)
Hemoglobin: 9 g/dL — ABNORMAL LOW (ref 12.0–15.0)
MCH: 29.8 pg (ref 26.0–34.0)
MCHC: 31.9 g/dL (ref 30.0–36.0)
MCV: 93.4 fL (ref 80.0–100.0)
Platelets: 268 10*3/uL (ref 150–400)
RBC: 3.02 MIL/uL — ABNORMAL LOW (ref 3.87–5.11)
RDW: 15.9 % — ABNORMAL HIGH (ref 11.5–15.5)
WBC: 9.3 10*3/uL (ref 4.0–10.5)
nRBC: 0 % (ref 0.0–0.2)

## 2019-12-04 LAB — GLUCOSE, CAPILLARY
Glucose-Capillary: 74 mg/dL (ref 70–99)
Glucose-Capillary: 173 mg/dL — ABNORMAL HIGH (ref 70–99)
Glucose-Capillary: 147 mg/dL — ABNORMAL HIGH (ref 70–99)
Glucose-Capillary: 227 mg/dL — ABNORMAL HIGH (ref 70–99)
Glucose-Capillary: 71 mg/dL (ref 70–99)
Glucose-Capillary: 180 mg/dL — ABNORMAL HIGH (ref 70–99)

## 2019-12-04 LAB — RENAL FUNCTION PANEL
Albumin: 3.3 g/dL — ABNORMAL LOW (ref 3.5–5.0)
Anion gap: 11 (ref 5–15)
BUN: 6 mg/dL (ref 6–20)
CO2: 28 mmol/L (ref 22–32)
Calcium: 8.5 mg/dL — ABNORMAL LOW (ref 8.9–10.3)
Chloride: 98 mmol/L (ref 98–111)
Creatinine, Ser: 1.91 mg/dL — ABNORMAL HIGH (ref 0.44–1.00)
GFR calc Af Amer: 33 mL/min — ABNORMAL LOW (ref >60)
GFR calc non Af Amer: 28 mL/min — ABNORMAL LOW (ref >60)
Glucose, Bld: 196 mg/dL — ABNORMAL HIGH (ref 70–99)
Phosphorus: 3.3 mg/dL (ref 2.5–4.6)
Potassium: 3.5 mmol/L (ref 3.5–5.1)
Sodium: 137 mmol/L (ref 135–145)

## 2019-12-04 MED ORDER — METOPROLOL TARTRATE 100 MG PO TABS
100.0000 mg | ORAL_TABLET | Freq: Two times a day (BID) | ORAL | Status: DC
  Administered 2019-12-04 – 2019-12-07 (×7): 100 mg via ORAL
  Filled 2019-12-04 (×7): qty 1

## 2019-12-04 MED ORDER — DARBEPOETIN ALFA 40 MCG/0.4ML IJ SOSY
40.0000 ug | PREFILLED_SYRINGE | INTRAMUSCULAR | Status: DC
  Administered 2019-12-06: 40 ug via INTRAVENOUS

## 2019-12-04 MED ORDER — SODIUM CHLORIDE 0.9% FLUSH
3.0000 mL | Freq: Two times a day (BID) | INTRAVENOUS | Status: DC
  Administered 2019-12-04 – 2019-12-07 (×5): 3 mL via INTRAVENOUS

## 2019-12-04 NOTE — Progress Notes (Addendum)
SATURATION QUALIFICATIONS: (This note is used to comply with regulatory documentation for home oxygen)  Patient Saturations on Room Air at Rest= 93%  Patient Saturations on Room Air while Ambulating = 92%  Patient Saturations on N/A Liters of oxygen while Ambulating = 91%  Please briefly explain why patient needs home oxygen: Patient did not c/o of shortness of breath while walking

## 2019-12-04 NOTE — Progress Notes (Addendum)
PROGRESS NOTE    [name] Michael  CLE:[id number] DOB: 04-28-1961 DOA: 12/02/2019 PCP: Charlott Rakes, MD    Chief Complaint  Patient presents with  . Altered Mental Status    Brief Narrative:  HPI per Dr. [name] is a 59 y.o. female with PMH of HTN, HLD, DM, ESRD, MDD who presented for AMS and admitted for hypoxia and syncope workup.  Patient reports that she was at her nursing facility today and was doing her laundry and started to feel weak so she went to the dining room and sat down and per her reports, she passed out. She is unsure how long this happened. Per nursing home report, she was brought to ER for AMS. Patient reports starting yesterday she was given Moxifloxacin and Oxycodone which are new meds and she is unsure why. Denies other complaints. Denies headache, dizziness, fever, chills, cough, SOB, chest pain, abdominal pain, nausea, vomiting, diarrhea, constipation, dysuria, hematuria, hematochezia, melena, difficulty moving arms/legs, speech difficulty, trouble eating, confusion or any other complaints.  In the ED: Slightly hypotensive and temp 95.91F otherwise stable on room air. Labs remarkable for glucose 299, Cr 3.75, Hgb 8.8, WBC 7.9, Ethanol level and UDS WNL, Lactate 2.3, UA without evidence of infection, COVID neg.  CXR: Cardiomegaly. Bronchovascular crowding versus vascular congestion.  CT Head and CTA Chest: Non-acute  EDP requested admission for hypoxia.  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  Syncope/??  Hypoxia, POA Patient presented from nursing facility with generalized weakness and concern that she may have passed out as well as altered mental status.  Patient noted to have recently been given antibiotics due to concern for possible UTI and oxycodone for right wrist pain after a fall.  On presentation to the ED patient noted to be slightly hypotensive with some hypothermia.  Chest x-ray with vascular congestion.  CT angiogram chest negative for PE, mild multichamber cardiomegaly with contrast refluxing into the hepatic veins and IVC consistent with elevated right heart pressures.  Patulous esophagus with small hiatal hernia.  CT had negative for any acute abnormalities.  Patient noted to have a prior history of altered mental status with hypoxia in the past felt likely secondary to volume overload.  Questionable syncope may have been in part medication induced from pain regimen given that nursing facility.  2D echo obtained with severe tricuspid regurgitation and right ventricular dilatation.  Consult with cardiology.  Nephrology consulted and patient for probable hemodialysis today for volume management.  2.  Severe tricuspid valvular regurgitation and right ventricular dilatation Noted on 2D echo.  Questionable etiology.  CT angiogram chest negative for PE.  Patient does have a tobacco history however CT findings does not show significant COPD/emphysema.  Cardiology consulted and recommended right heart cath to define pulmonary pressures.  3.  End-stage renal disease On Tuesday Thursday Saturday schedule.  Patient noted to have missed hemodialysis on 11/29/2019.  Nephrology consulted and patient underwent hemodialysis on 12/03/2019.  Per nephrology.   4.  Well-controlled diabetes mellitus type 2 Hemoglobin A1c 6.5 on 04/28/2019.  Repeat hemoglobin A1c at 8.8 (12/03/2019).  CBG 74 this morning.  Sliding scale insulin, meal coverage insulin 12  units 3 times daily with meals.  Continue Neurontin.  5.  Gastroesophageal reflux disease PPI.  6.  Hyperlipidemia Continue statin.  7.  Hypertension Blood pressure noted to be borderline on admission.  BP improved.  Resume home regimen Lopressor.  Follow.   8.  Depression Continue Prozac.  9.  Right wrist pain Mechanical fall.  Plain films of the right wrist with lucency at the base of the ulnar styloid which may be acute or chronic.  Evidence of old distal radial/ulnar meta physeal fractures.  Pain management.  10.  Tobacco abuse Tobacco cessation expressed to patient.  Patient does not want a nicotine patch at this time.   DVT prophylaxis: Heparin Code Status: Full Family Communication: Updated patient.  No family at bedside. Disposition:   Status is: Inpatient    Dispo: The patient is from: SNF              Anticipated d/c is to:SNF              Anticipated d/c date is: To be determined.              Patient currently volume overloaded presenting with shortness of breath and hypoxia underwent hemodialysis.  2D echo with severe right tricuspid regurgitation and RV dilatation.  Cardiology consulted for further evaluation recommended right heart cath.  Nephrology following.        Consultants:   Nephrology: Dr. Carolin Sicks 12/03/2019  Cardiology: Dr. Harrington Challenger 12/04/2019  Procedures:   Plain films of the right wrist 12/03/2019  CT angiogram chest 12/02/2019  CT head without contrast 12/02/2019  2D echo 12/03/2019  Antimicrobials:   None   Subjective: Laying in bed.  States some improvement with shortness of breath after hemodialysis.  No further syncopal episodes.  No chest pain.  Still with some right wrist pain.    Objective: Vitals:   12/04/19 0030 12/04/19 0130 12/04/19 0804 12/04/19 0834  BP: (!) 175/71 (!) 162/69 (!) 160/66 (!) 149/81  Pulse: 81 92 81   Resp: 16 16 18    Temp: 98 F (36.7 C) 98.5 F (36.9 C) 99 F (37.2 C) 98.1 F (36.7 C)  TempSrc:  Oral Oral  Oral  SpO2: 98% 98% 100%   Weight: 50.3 kg     Height:        Intake/Output Summary (Last 24 hours) at 12/04/2019 1657 Last data filed at 12/04/2019 0806 Gross per 24 hour  Intake --  Output 1945 ml  Net -1945 ml   Filed Weights   12/03/19 0100 12/03/19 2000 12/04/19 0030  Weight: 49.5 kg 52.1 kg 50.3 kg    Examination:  General exam: NAD Respiratory system: Decreased breath sounds in the bases.  Some bibasilar crackles.  No wheezing.  Speaking in full sentences.  Normal respiratory effort.  No use of accessory muscles of respiration. Cardiovascular system: Regular rate and rhythm no murmurs rubs or gallops.  No JVD.  No lower extremity edema. Gastrointestinal system: Abdomen is soft, nontender, nondistended, positive bowel sounds.  No rebound.  No guarding.  Central nervous system: Alert and oriented. No focal neurological deficits. Extremities: Symmetric 5 x 5 power.  Left upper extremity AV fistula with thrill/bruit.  Right wrist slightly swollen with some tenderness to palpation. Skin: No rashes, lesions or  ulcers Psychiatry: Judgement and insight appear normal. Mood & affect appropriate.     Data Reviewed: I have personally reviewed following labs and imaging studies  CBC: Recent Labs  Lab 12/02/19 1603 12/02/19 1721 12/03/19 0329 12/04/19 0448  WBC 7.9  --  7.7 9.3  NEUTROABS 6.2  --   --   --   HGB 8.8* 8.5* 8.5* 9.0*  HCT 28.6* 25.0* 27.1* 28.2*  MCV 96.9  --  96.4 93.4  PLT 216  --  241 098    Basic Metabolic Panel: Recent Labs  Lab 12/02/19 1603 12/02/19 1721 12/03/19 0329 12/04/19 0448  NA 135 135 137 137  K 5.0 4.7 4.4 3.5  CL 96*  --  98 98  CO2 27  --  24 28  GLUCOSE 299*  --  156* 196*  BUN 22*  --  28* 6  CREATININE 3.75*  --  4.31* 1.91*  CALCIUM 8.7*  --  8.6* 8.5*  PHOS  --   --   --  3.3    GFR: Estimated Creatinine Clearance: 19.6 mL/min (A) (by C-G formula based on SCr of 1.91 mg/dL (H)).  Liver Function  Tests: Recent Labs  Lab 12/02/19 1603 12/04/19 0448  AST 20  --   ALT 22  --   ALKPHOS 103  --   BILITOT 0.5  --   PROT 7.5  --   ALBUMIN 3.6 3.3*    CBG: Recent Labs  Lab 12/03/19 1820 12/04/19 0133 12/04/19 0246 12/04/19 0804 12/04/19 1344  GLUCAP 217* 74 173* 147* 227*     Recent Results (from the past 240 hour(s))  Blood Cultures (routine x 2)     Status: None (Preliminary result)   Collection Time: 12/02/19  3:46 PM   Specimen: BLOOD RIGHT HAND  Result Value Ref Range Status   Specimen Description BLOOD RIGHT HAND  Final   Special Requests   Final    BOTTLES DRAWN AEROBIC ONLY Blood Culture results may not be optimal due to an inadequate volume of blood received in culture bottles   Culture   Final    NO GROWTH 2 DAYS Performed at [facility], [place] [address] [place], [place] [address]    Report Status PENDING  Incomplete  Blood Cultures (routine x 2)     Status: None (Preliminary result)   Collection Time: 12/02/19  4:02 PM   Specimen: BLOOD  Result Value Ref Range Status   Specimen Description BLOOD RIGHT ANTECUBITAL  Final   Special Requests   Final    BOTTLES DRAWN AEROBIC AND ANAEROBIC Blood Culture adequate volume   Culture   Final    NO GROWTH 2 DAYS Performed at [facility], [place] [address] [place], [place] [address]    Report Status PENDING  Incomplete  SARS Coronavirus 2 by RT PCR (hospital order, performed in [facility] hospital lab) Nasopharyngeal Nasopharyngeal Swab     Status: None   Collection Time: 12/02/19  5:00 PM   Specimen: Nasopharyngeal Swab  Result Value Ref Range Status   SARS Coronavirus 2 NEGATIVE NEGATIVE Final    Comment: (NOTE) SARS-CoV-2 target nucleic acids are NOT DETECTED.  The SARS-CoV-2 RNA is generally detectable in upper and lower respiratory specimens during the acute phase of infection. The lowest concentration of SARS-CoV-2 viral copies this assay can detect is 250 copies / mL. A negative result  does not preclude SARS-CoV-2 infection and should not be used as the sole basis  for treatment or other patient management decisions.  A negative result may occur with improper specimen collection / handling, submission of specimen other than nasopharyngeal swab, presence of viral mutation(s) within the areas targeted by this assay, and inadequate number of viral copies (<250 copies / mL). A negative result must be combined with clinical observations, patient history, and epidemiological information.  Fact Sheet for Patients:   StrictlyIdeas.no  Fact Sheet for Healthcare Providers: BankingDealers.co.za  This test is not yet approved or  cleared by the [place] FDA and has been authorized for detection and/or diagnosis of SARS-CoV-2 by FDA under an Emergency Use Authorization (EUA).  This EUA will remain in effect (meaning this test can be used) for the duration of the COVID-19 declaration under Section 564(b)(1) of the Act, 21 U.S.C. section 360bbb-3(b)(1), unless the authorization is terminated or revoked sooner.  Performed at [facility], [place] [address], [place], [place] [address]          Radiology Studies: DG Wrist Complete Right  Result Date: 12/03/2019 CLINICAL DATA:  Fall 1 week ago with right wrist pain. EXAM: RIGHT WRIST - COMPLETE 3+ VIEW COMPARISON:  08/31/2018 FINDINGS: Mild degenerate changes of the radiocarpal joint and radial side of the carpal bones/first carpometacarpal joint. Evidence of patient's old distal radial and ulnar metaphyseal fractures. Subtle lucency at the base of the ulnar styloid which may be acute or chronic. IMPRESSION: Lucency at the base of the ulnar styloid which may be acute or chronic. Evidence of old distal radial/ulnar metaphyseal fractures. Electronically Signed   By: Marin Olp M.D.   On: 12/03/2019 11:49   CT HEAD WO CONTRAST  Result Date: 12/02/2019 CLINICAL DATA:  Altered  mental status. EXAM: CT HEAD WITHOUT CONTRAST TECHNIQUE: Contiguous axial images were obtained from the base of the skull through the vertex without intravenous contrast. COMPARISON:  CT head dated April 28, 2019. FINDINGS: Brain: No evidence of acute infarction, hemorrhage, hydrocephalus, extra-axial collection or mass lesion/mass effect. Small bilateral basal ganglia chronic lacunar infarcts again noted. Stable mild atrophy and chronic microvascular ischemic changes. Vascular: Calcified atherosclerosis at the skullbase. No hyperdense vessel. Skull: Normal. Negative for fracture or focal lesion. Sinuses/Orbits: No acute finding. Chronic small vitreous hemorrhage in the posterior left globe. Other: None. IMPRESSION: 1. No acute intracranial abnormality. Electronically Signed   By: Titus Dubin M.D.   On: 12/02/2019 18:56   CT Angio Chest PE W and/or Wo Contrast  Result Date: 12/02/2019 CLINICAL DATA:  Shortness of breath.  Altered mental status. EXAM: CT ANGIOGRAPHY CHEST WITH CONTRAST TECHNIQUE: Multidetector CT imaging of the chest was performed using the standard protocol during bolus administration of intravenous contrast. Multiplanar CT image reconstructions and MIPs were obtained to evaluate the vascular anatomy. CONTRAST:  72mL OMNIPAQUE IOHEXOL 350 MG/ML SOLN COMPARISON:  Radiograph earlier this day. FINDINGS: Cardiovascular: There are no filling defects within the pulmonary arteries to suggest pulmonary embolus. Aortic atherosclerosis. No aneurysm. Common origin of the brachiocephalic and left common carotid artery, variant arch anatomy. Mild multi chamber cardiomegaly. There is contrast refluxing into the hepatic veins and IVC. No pericardial effusion. Mediastinum/Nodes: No enlarged mediastinal or hilar lymph nodes. No thyroid nodule. Patulous esophagus with small hiatal hernia. No definite wall thickening. Lungs/Pleura: Dependent atelectasis. Subsegmental atelectasis in the lingula. No septal  thickening or pulmonary edema. No pleural effusion. Trachea and mainstem bronchi are patent. Upper Abdomen: Contrast refluxing into the hepatic veins and IVC. No other acute findings. Musculoskeletal: There are no acute  or suspicious osseous abnormalities. Review of the MIP images confirms the above findings. IMPRESSION: 1. No pulmonary embolus. 2. Mild multi chamber cardiomegaly with contrast refluxing into the hepatic veins and IVC consistent with elevated right heart pressures. 3. Patulous esophagus with small hiatal hernia. Aortic Atherosclerosis (ICD10-I70.0). Electronically Signed   By: Keith Rake M.D.   On: 12/02/2019 23:42   ECHOCARDIOGRAM COMPLETE  Result Date: 12/03/2019    ECHOCARDIOGRAM REPORT   Patient Name:   [name] Michael Date of Exam: 12/03/2019 Medical Rec #:  [id number]      Height:       54.0 in Accession #:    [id number]     Weight:       109.1 lb Date of Birth:  01-Jul-1960      BSA:          1.337 m Patient Age:    21 years       BP:           139/56 mmHg Patient Gender: F              HR:           75 bpm. Exam Location:  Inpatient Procedure: 2D Echo, Cardiac Doppler and Color Doppler Indications:    Syncope 780.2 / R55  History:        Patient has prior history of Echocardiogram examinations, most                 recent 06/10/2018. Stroke; Risk Factors:Hypertension, Diabetes                 and Dyslipidemia. CKD (chronic kidney disease) stage 5, GFR less                 than 15 ml/min.  Sonographer:    Alvino Chapel RCS Referring Phys: [id number] Christopher  1. Left ventricular ejection fraction, by estimation, is 55 to 60%. The left ventricle has normal function. The left ventricle has no regional wall motion abnormalities. Left ventricular diastolic parameters were normal.  2. Severe TR and RV dilatation suggest elevated right heart pressures consider CTA to r/o PE in setting of syncope . Right ventricular systolic function is mildly reduced. The right ventricular size  is normal. There is mildly elevated pulmonary artery systolic pressure.  3. Right atrial size was mildly dilated.  4. The mitral valve is normal in structure. Mild to moderate mitral valve regurgitation. No evidence of mitral stenosis.  5. 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  6. The aortic valve is normal in structure. Aortic valve regurgitation is not visualized. No aortic stenosis is present.  7. The inferior vena cava is normal in size with <50% respiratory variability, suggesting right atrial pressure of 8 mmHg. FINDINGS  Left Ventricle: Left ventricular ejection fraction, by estimation, is 55 to 60%. The left ventricle has normal function. The left ventricle has no regional wall motion abnormalities. The left ventricular internal cavity size was normal in size. There is  no left ventricular hypertrophy. Left ventricular diastolic parameters were normal. Right Ventricle: Severe TR and RV dilatation suggest elevated right heart pressures consider CTA to r/o PE in setting of syncope. The right ventricular size is normal. Right vetricular wall thickness was not assessed. Right ventricular systolic function is mildly reduced. There is mildly elevated pulmonary artery systolic pressure. The tricuspid regurgitant velocity is 3.00 m/s, and with an assumed right atrial pressure of 3 mmHg, the estimated right ventricular systolic pressure is  39.0 mmHg. Left Atrium: Left atrial size was normal in size. Right Atrium: Right atrial size was mildly dilated. Pericardium: There is no evidence of pericardial effusion. Mitral Valve: The mitral valve is normal in structure. There is mild thickening of the mitral valve leaflet(s). There is mild calcification of the mitral valve leaflet(s). Normal mobility of the mitral valve leaflets. Mild to moderate mitral valve regurgitation. No evidence of mitral valve stenosis. Tricuspid Valve: The tricuspid valve is normal in structure. 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 No  evidence of tricuspid stenosis. Aortic Valve: The aortic valve is normal in structure. Aortic valve regurgitation is not visualized. No aortic stenosis is present. Pulmonic Valve: The pulmonic valve was normal in structure. Pulmonic valve regurgitation is not visualized. No evidence of pulmonic stenosis. Aorta: The aortic root is normal in size and structure. Venous: The inferior vena cava is normal in size with less than 50% respiratory variability, suggesting right atrial pressure of 8 mmHg. IAS/Shunts: No atrial level shunt detected by color flow Doppler.  LEFT VENTRICLE PLAX 2D LVIDd:         4.50 cm  Diastology LVIDs:         2.70 cm  LV e' lateral:   8.81 cm/s LV PW:         1.00 cm  LV E/e' lateral: 15.3 LV IVS:        1.00 cm  LV e' medial:    7.72 cm/s LVOT diam:     1.50 cm  LV E/e' medial:  17.5 LV SV:         48 LV SV Index:   36 LVOT Area:     1.77 cm  RIGHT VENTRICLE RV S prime:     12.60 cm/s TAPSE (M-mode): 2.2 cm LEFT ATRIUM             Index       RIGHT ATRIUM           Index LA diam:        3.60 cm 2.69 cm/m  RA Area:     16.70 cm LA Vol (A2C):   55.6 ml 41.58 ml/m RA Volume:   50.10 ml  37.47 ml/m LA Vol (A4C):   45.8 ml 34.25 ml/m LA Biplane Vol: 50.9 ml 38.07 ml/m  AORTIC VALVE LVOT Vmax:   110.00 cm/s LVOT Vmean:  72.000 cm/s LVOT VTI:    0.269 m  AORTA Ao Root diam: 2.80 cm MITRAL VALVE                 TRICUSPID VALVE MV Area (PHT): 4.31 cm      TR Peak grad:   36.0 mmHg MV Decel Time: 176 msec      TR Vmax:        300.00 cm/s MR Peak grad:    140.2 mmHg MR Mean grad:    100.0 mmHg  SHUNTS MR Vmax:         592.00 cm/s Systemic VTI:  0.27 m MR Vmean:        477.0 cm/s  Systemic Diam: 1.50 cm MR PISA:         1.01 cm MR PISA Eff ROA: 5 mm MR PISA Radius:  0.40 cm MV E velocity: 135.00 cm/s MV A velocity: 113.00 cm/s MV E/A ratio:  1.19 Jenkins Rouge MD Electronically signed by Jenkins Rouge MD Signature Date/Time: 12/03/2019/4:19:51 PM    Final         Scheduled Meds: . aspirin  81 mg Oral Daily  . atorvastatin  40 mg Oral QHS  . Chlorhexidine Gluconate Cloth  6 each Topical Daily  . Chlorhexidine Gluconate Cloth  6 each Topical Q0600  . [START ON 12/06/2019] darbepoetin (ARANESP) injection - DIALYSIS  40 mcg Intravenous Q Tue-HD  . doxercalciferol  6 mcg Intravenous Q T,Th,Sa-HD  . FLUoxetine  10 mg Oral Daily  . gabapentin  100 mg Oral BID  . heparin  5,000 Units Subcutaneous Q8H  . insulin aspart  0-5 Units Subcutaneous QHS  . insulin aspart  0-9 Units Subcutaneous TID WC  . insulin aspart  12 Units Subcutaneous TID WC  . metoprolol tartrate  100 mg Oral BID  . pantoprazole  40 mg Oral Daily  . polyethylene glycol  17 g Oral Daily  . sevelamer carbonate  800 mg Oral TID WC  . sodium chloride flush  3 mL Intravenous Q12H   Continuous Infusions:   LOS: 1 day    Time spent: 40 minutes    Irine Seal, MD [facility]   To contact the attending provider between 7A-7P or the covering provider during after hours 7P-7A, please log into the web site [email or web address] and access using universal [place] password for that web site. If you do not have the password, please call the hospital operator.  12/04/2019, 4:57 PM

## 2019-12-04 NOTE — H&P (View-Only) (Signed)
Cardiology Consult    Patient ID: [name] Michael MRN: [id number], DOB/AGE: 07/31/1960   Admit date: 12/02/2019 Date of Consult: 12/04/2019  Primary Physician: Charlott Rakes, MD Primary Cardiologist: Dorris Carnes, MD - new Requesting Provider: D. Grandville Silos, MD  Patient Profile    [name] Michael is a 59 y.o. female with a history of HTN, HL, stroke, TIA, DMII, ongoing tob abuse, ESRD on HD, depression, and GERD, who is being seen today for the evaluation of syncope and right heart failure w/ severe TR at the request of Dr. Grandville Silos.  Past Medical History   Past Medical History:  Diagnosis Date  . Ambulates with cane   . Constipation   . Depression   . ESRD (end stage renal disease) ([place])    a. TTS Dialysis.  Marland Kitchen GERD (gastroesophageal reflux disease)   . History of stress test    a. 01/2003 MV: EF 74%, no ischemia/infarct.  . Hyperlipidemia   . Hypertension   . Osteoporosis   . Stroke [facility]) 04-01-11   left frontal subcortical, saw Dr. [name]   . Syncope 11/2019  . TIA (transient ischemic attack) 03-12-11  . Tobacco abuse   . Tricuspid regurgitation    a. 05/2016 Echo: EF 65-70%, Gr2DD, mild MR, nl RV fxn, Triv TR, PASP 75mmHg; b. 05/2018 Echo: EF 60-65%, Gr2DD, mild MR/TR, RVSP/PASP 61mmHg; c. 11/2019 Echo: EF 55-60%, no rwma, mild-mod MR, Sev TR w/ RV dilatation (CTA chest neg for PE).  . Type II diabetes mellitus ([place])   . Vitamin D deficiency     Past Surgical History:  Procedure Laterality Date  . AV FISTULA PLACEMENT Left 02/21/2019   Procedure: BRACHIOCEPHALIC ARTERIOVENOUS (AV) FISTULA CREATION;  Surgeon: [name], MD;  Location: [place];  Service: Vascular;  Laterality: Left;  . BASCILIC VEIN TRANSPOSITION Left 04/11/2019   Procedure: SECOND STAGE BASILIC VEIN TRANSPOSITION LEFT ARM;  Surgeon: [name], MD;  Location: [place];  Service: Vascular;  Laterality: Left;  . IR FLUORO GUIDE CV LINE RIGHT  04/29/2019  . IR US GUIDE VASC ACCESS RIGHT  04/29/2019    . OPEN REDUCTION INTERNAL FIXATION (ORIF) DISTAL RADIAL FRACTURE Left 01/28/2016   Procedure: OPEN REDUCTION INTERNAL FIXATION (ORIF) DISTAL RADIAL FRACTURE;  Surgeon: Iran Planas, MD;  Location: [place];  Service: Orthopedics;  Laterality: Left;  . [place] ADENOIDECTOMY     age 74     Allergies  Allergies  Allergen Reactions  . Hydrocodone Nausea And Vomiting    Listed in Epic, but not on the [facility]    History of Present Illness    59 y/o ? w/ the above complex PMH including HTN, HL, DMII, ESRD on HD, stroke, TIA, ongoing tob abuse, GERD, and depression.  She previously underwent stress testing in 2004 in the setting of chest pain, which was low-risk/non-ischemic.  Prior echocardiograms, performed in the setting of sepsis in 05/2016 and in the setting of syncope/dehydration in 05/2018, showed normal LV function and grade 2 diastolic dysfunction.  She had mild MR and TR on echo in 05/2018 w/ PASP of 27mmHg.  She was most recently admitted in 04/2019 in the setting of hypoxia and volume overload following tunneled catheter placement for dialysis access.  HD was initiated at that time.  A V:Q scan was performed during that admission and showed no perfusion defects.  Since initiating HD late last Fall, [name] Michael has generally done well.  She does experience fatigue and occasionally low blood pressures and lightheadedness on HD days, but on non-HD days, she usually walks for ~ 20 mins w/o chest pain, dyspnea, or presyncope.  Last Saturday, 6/5, she slipped on a wet floor at her assisted living facility, and fell.  She didn't immediately notice any discomfort.  Overnight, she fell out of her bed (she's not sure what occurred).  On the morning of June 6, she noted right hand pain.  Pain was fairly severe, and she says that on Tuesday, June 8, she did not feel well, partly related to hand pain, and she skipped dialysis.  She also reported to staff  dysuria.  She was placed on moxifloxacin and oxycodone and believes she did not receive her first dose of oxycodone until June 11.  She says that after receiving pain medicine that day, she was standing in the laundry room, doing her laundry, when she began feeling very warm and sleepy.  She left the laundry room and walked to the cafeteria, which she says is about a 1 minute walk.  At no point did she feel chest pain, dyspnea, lightheadedness/dizziness, or palpitations.  When she sat in the cafeteria, she rested her head on the table and believes that she either fell asleep or lost consciousness.  There were many residents and staff members around her and EMS was called.  She does not have any recollection of what occurred prior to EMS arrival.  Per notes, upon EMS arrival she was found to be hypoxic with saturations in the low 80s.  She was placed on 2 L via nasal cannula.  Altered mental status was also reported.    She was taken to the [facility] ED where her temperature was 95.9.  She was noted to have stable anemia with a hemoglobin of 8.8 and hematocrit of 28.6.  WBCs were normal at 7.9.  Lactate was mildly elevated at 2.3, though procalcitonin was normal at 0.40.  Creatinine was 3.75 on arrival.  BNP was elevated at 1275.7.  High-sensitivity troponin = 8.  Chest x-ray showed cardiomegaly with bronchovascular crowding versus less congestion.  CT of the head was negative for acute abnormality.  CTA of the chest was negative for PE.  Mild multichamber cardiomegaly with contrast refluxing into the hepatic veins and IVC were noted, consistent with elevated right heart pressures.  She was admitted and underwent echocardiography yesterday, June 12, revealing normal LV function and RV dilatation with an RVSP of 39 mmHg with severe tricuspid regurgitation.  Mild to moderate mitral regurgitation were also noted.  She was seen by nephrology and underwent dialysis yesterday evening.  This morning, she says she feels sleepy  but denies any chest pain or dyspnea.  We have been asked to evaluate secondary to right heart failure and severe tricuspid regurgitation.  Inpatient Medications    . aspirin  81 mg Oral Daily  . atorvastatin  40 mg Oral QHS  . Chlorhexidine Gluconate Cloth  6 each Topical Daily  . Chlorhexidine Gluconate Cloth  6 each Topical Q0600  . darbepoetin (ARANESP) injection - DIALYSIS  40 mcg Intravenous Q Sat-HD  . doxercalciferol  6 mcg Intravenous Q T,Th,Sa-HD  . FLUoxetine  10 mg Oral Daily  . gabapentin  100 mg Oral BID  . heparin  5,000 Units Subcutaneous Q8H  . insulin aspart  0-5 Units Subcutaneous QHS  . insulin aspart  0-9 Units Subcutaneous TID WC  . insulin aspart  12 Units Subcutaneous TID WC  .  metoprolol tartrate  100 mg Oral BID  . pantoprazole  40 mg Oral Daily  . polyethylene glycol  17 g Oral Daily  . sevelamer carbonate  800 mg Oral TID WC  . sodium chloride flush  3 mL Intravenous Q12H    Family History    Family History  Problem Relation Age of Onset  . Stroke Mother   . Diabetes Mother   . Kidney failure Mother   . Heart failure Mother   . Stroke Father   . Cancer Sister        Breast- 73's   She indicated that her mother is deceased. She indicated that her father is deceased. She indicated that the status of her sister is unknown.   Social History    Social History   Socioeconomic History  . Marital status: Single    Spouse name: Not on file  . Number of children: Not on file  . Years of education: Not on file  . Highest education level: Not on file  Occupational History  . Not on file  Tobacco Use  . Smoking status: Current Every Day Smoker    Packs/day: 0.30    Years: 32.00    Pack years: 9.60    Types: Cigarettes    Last attempt to quit: 09/23/2012    Years since quitting: 7.2  . Smokeless tobacco: Never Used  . Tobacco comment: 8 cigarettes/day  Vaping Use  . Vaping Use: Never used  Substance and Sexual Activity  . Alcohol use: No  .  Drug use: No  . Sexual activity: Never    Birth control/protection: Post-menopausal  Other Topics Concern  . Not on file  Social History Narrative   Lives at [facility], Assisted living. Does not work.      Social Determinants of Health   Financial Resource Strain:   . Difficulty of Paying Living Expenses:   Food Insecurity:   . Worried About Charity fundraiser in the Last Year:   . Arboriculturist in the Last Year:   Transportation Needs:   . Film/video editor (Medical):   Marland Kitchen Lack of Transportation (Non-Medical):   Physical Activity:   . Days of Exercise per Week:   . Minutes of Exercise per Session:   Stress:   . Feeling of Stress :   Social Connections:   . Frequency of Communication with Friends and Family:   . Frequency of Social Gatherings with Friends and Family:   . Attends Religious Services:   . Active Member of Clubs or Organizations:   . Attends Archivist Meetings:   Marland Kitchen Marital Status:   Intimate Partner Violence:   . Fear of Current or Ex-Partner:   . Emotionally Abused:   Marland Kitchen Physically Abused:   . Sexually Abused:      Review of Systems    General:  No chills, fever, night sweats or weight changes.  Cardiovascular:  No chest pain, +++ occasional dyspnea on exertion, no edema, orthopnea, palpitations, paroxysmal nocturnal dyspnea.  +++? Syncope. Dermatological: No rash, lesions/masses Respiratory: No cough, +++ occasional dyspnea Urologic: No hematuria, +++ recent dysuria for which she was placed on abx last week. Abdominal:   No nausea, vomiting, diarrhea, bright red blood per rectum, melena, or hematemesis Neurologic:  No visual changes, +++ generalized wkns/sleepiness, +++ changes in mental status on day of admission. MSK: +++ recent fall w/ resultant R hand/wrist pain.   All other systems reviewed and are otherwise negative  except as noted above.  Physical Exam    Blood pressure (!) 149/81, pulse 81, temperature 98.1 F (36.7 C),  temperature source Oral, resp. rate 18, height 4\' 6"  (1.372 m), weight 50.3 kg, last menstrual period 03/06/2013, SpO2 100 %.  General: Pleasant, NAD Psych: Normal affect. Neuro: Alert and oriented X 3. Moves all extremities spontaneously. HEENT: Normal  Neck: Supple without JVD. AVF bruit heard over neck. Lungs:  Resp regular and unlabored, bibasilar crackles. Heart: RRR no s3, s4. AVF bruit heard over superior chest wall and tracks to the [name].  2/6 syst murmur @ the LLSB. Abdomen: Soft, non-tender, non-distended, BS + x 4.  Extremities: No clubbing, cyanosis or edema. DP/PT/Radials 2+ and equal bilaterally. + [name] bruit/thrill.  Labs    Cardiac Enzymes Recent Labs  Lab 12/03/19 0329  TROPONINIHS 8      Lab Results  Component Value Date   WBC 9.3 12/04/2019   HGB 9.0 (L) 12/04/2019   HCT 28.2 (L) 12/04/2019   MCV 93.4 12/04/2019   PLT 268 12/04/2019    Recent Labs  Lab 12/02/19 1603 12/02/19 1721 12/04/19 0448  NA 135   < > 137  K 5.0   < > 3.5  CL 96*   < > 98  CO2 27   < > 28  BUN 22*   < > 6  CREATININE 3.75*   < > 1.91*  CALCIUM 8.7*   < > 8.5*  PROT 7.5  --   --   BILITOT 0.5  --   --   ALKPHOS 103  --   --   ALT 22  --   --   AST 20  --   --   GLUCOSE 299*   < > 196*   < > = values in this interval not displayed.     Radiology Studies    DG Wrist Complete Right  Result Date: 12/03/2019 CLINICAL DATA:  Fall 1 week ago with right wrist pain. EXAM: RIGHT WRIST - COMPLETE 3+ VIEW COMPARISON:  08/31/2018 FINDINGS: Mild degenerate changes of the radiocarpal joint and radial side of the carpal bones/first carpometacarpal joint. Evidence of patient's old distal radial and ulnar metaphyseal fractures. Subtle lucency at the base of the ulnar styloid which may be acute or chronic. IMPRESSION: Lucency at the base of the ulnar styloid which may be acute or chronic. Evidence of old distal radial/ulnar metaphyseal fractures. Electronically Signed   By: Marin Olp M.D.    On: 12/03/2019 11:49   CT HEAD WO CONTRAST  Result Date: 12/02/2019 CLINICAL DATA:  Altered mental status. EXAM: CT HEAD WITHOUT CONTRAST TECHNIQUE: Contiguous axial images were obtained from the base of the skull through the vertex without intravenous contrast. COMPARISON:  CT head dated April 28, 2019. FINDINGS: Brain: No evidence of acute infarction, hemorrhage, hydrocephalus, extra-axial collection or mass lesion/mass effect. Small bilateral basal ganglia chronic lacunar infarcts again noted. Stable mild atrophy and chronic microvascular ischemic changes. Vascular: Calcified atherosclerosis at the skullbase. No hyperdense vessel. Skull: Normal. Negative for fracture or focal lesion. Sinuses/Orbits: No acute finding. Chronic small vitreous hemorrhage in the posterior left globe. Other: None. IMPRESSION: 1. No acute intracranial abnormality. Electronically Signed   By: Titus Dubin M.D.   On: 12/02/2019 18:56   CT Angio Chest PE W and/or Wo Contrast  Result Date: 12/02/2019 CLINICAL DATA:  Shortness of breath.  Altered mental status. EXAM: CT ANGIOGRAPHY CHEST WITH CONTRAST TECHNIQUE: Multidetector CT imaging of the chest was  performed using the standard protocol during bolus administration of intravenous contrast. Multiplanar CT image reconstructions and MIPs were obtained to evaluate the vascular anatomy. CONTRAST:  16mL OMNIPAQUE IOHEXOL 350 MG/ML SOLN COMPARISON:  Radiograph earlier this day. FINDINGS: Cardiovascular: There are no filling defects within the pulmonary arteries to suggest pulmonary embolus. Aortic atherosclerosis. No aneurysm. Common origin of the brachiocephalic and left common carotid artery, variant arch anatomy. Mild multi chamber cardiomegaly. There is contrast refluxing into the hepatic veins and IVC. No pericardial effusion. Mediastinum/Nodes: No enlarged mediastinal or hilar lymph nodes. No thyroid nodule. Patulous esophagus with small hiatal hernia. No definite wall  thickening. Lungs/Pleura: Dependent atelectasis. Subsegmental atelectasis in the lingula. No septal thickening or pulmonary edema. No pleural effusion. Trachea and mainstem bronchi are patent. Upper Abdomen: Contrast refluxing into the hepatic veins and IVC. No other acute findings. Musculoskeletal: There are no acute or suspicious osseous abnormalities. Review of the MIP images confirms the above findings. IMPRESSION: 1. No pulmonary embolus. 2. Mild multi chamber cardiomegaly with contrast refluxing into the hepatic veins and IVC consistent with elevated right heart pressures. 3. Patulous esophagus with small hiatal hernia. Aortic Atherosclerosis (ICD10-I70.0). Electronically Signed   By: Keith Rake M.D.   On: 12/02/2019 23:42   DG Chest Port 1 View  Result Date: 12/02/2019 CLINICAL DATA:  Altered level of consciousness. EXAM: PORTABLE CHEST 1 VIEW COMPARISON:  04/28/2019 FINDINGS: Cardiomegaly. Unchanged mediastinal contours with aortic atherosclerosis. Previous right pleural effusion is diminished and is likely resolved. Bronchovascular crowding versus vascular congestion. No focal airspace disease or pneumothorax. No acute osseous abnormalities are seen IMPRESSION: Cardiomegaly. Bronchovascular crowding versus vascular congestion. Electronically Signed   By: Keith Rake M.D.   On: 12/02/2019 16:28   ECG & Cardiac Imaging    RSR, 63, RAD, RAE, LPFB, RBBB, mild inflat ST depression. No acute changes - personally reviewed.  Assessment & Plan    1.  Right Ventricular Dilatation/Severe Tricuspid Regurgitation:  Pt w/o significant prior cardiac history, who presented 6/11 in the setting of AMS and ? Syncope that occurred after taking oxycodone for right hand/wrist pain earlier in the day.  Pt was hypoxic on EMS arrival w/ O2 sats reported in the low 80's.  CT Head neg for acute intracranial abnormality.  CTA chest was neg for PE, though did show mild multi-chamber cardiomegaly w/ contrast  refluxing into the hepatic veins and IVC consistent w/ elevated R heart pressures.  Echo on 6/12 showed RV dilatation w/ RVSP of 16mmHg and Severe TR.  Prior echo in 05/2018 showed RVSP of 58mmHg w/ mild TR.  Notably, she did miss HD on Tues 6/8, though was dialyzed on 6/10 and again last night (echo was prior to HD last night).  She walks for 20 min 3x/wk w/o symptoms or limitations @ home.  To determine role of TR and whether it is a primary issue or 2/2 elevated PA/RV pressures, we will arrange for a RHC.  If pressures elevated, would pursue V:Q scan to r/o chronic PE (neg V:Q in Nov, so would have had to develop since then).  ? Role of missing HD on RV volume overload, though RVSP not much higher than on prior echo in 05/2019 and no evidence of L heart failure on admission/exam.  Also must question potential role of [name] AVF w/ L  R shunting and increased preload - RHC and confirming filling pressures will assist in sorting this out as well.  2.  AMS/? Syncope:  Pt says that she  felt warm and then very sleepy prior to potentially losing consciousness.  She denies experiencing lightheadedness or a sense that she might pass out.  No nausea/vomiting/diaphoresis.  She walked from the laundry room @ her facility to the cafeteria and remembers placing her head on a table b/c she was so tired.  She was found to be hypoxic.  As above, w/u neg for CVA/PE.  She cont to feel sleepy but had HD late yesterday evening.  ECG w/o acute findings.  No acute events on tele.  Echo w/ nl LVEF, making ventricular arrhythmias less likely.  Can consider outpt event/Zio monitoring to further exclude arrhythmia as a potential cause of AMS/syncope, though based on her description, I question role of narcotic.  3.  ESRD:  On TTS HD.  Per nephrology.  ? Role of [name] AVF on elevated RV pressures and TR (RHC as above).  4.  Essential HTN:  BP elevated this AM, though she tends to run low @/after HD. Cont  blocker.  5.  HL:  Cont statin  rx.  F/u as outpt.  6.  DMII:  Poorly controlled.  A1c 8.8.  Per IM.  7. H/o Stroke:  On ASA/statin.  9.  Ongoing tobacco abuse:  Still smoking 8 cigarettes/day. Cessation advised.  She says that there's nothing we can say/do to get her to consider quitting.  Signed, Murray Hodgkins, NP 12/04/2019, 11:03 AM  For questions or updates, please contact   Please consult [email or web address] for contact info under Cardiology/STEMI.  Patient seen and examined   I agree with findings as noted above by Angelica Ran. Pt is a 59 yo with Hx of HTN, ESRD, DM, Hx stroke/TIA.   She was admitted yesterday with "syncope" The pt says she gets weak sometimes, her legs give out and she can fall.   She gets occasionally low BP / dizzy on HD days  But OK on nondialysis days.   Skipped dialysis on 6/8 but had it on June 10 (Thurs)   On June 11 had taken oxycodone for pain in hand.  While standing at door she became weak   Went to cafeteria and put head on table  Per EMS sats were in low 80s   Placed on O2 [place] Brought to [facility] Here, CT neg for PE  Echo done   I have reviewed.   RV is mildly dilated.  OVerall RVEF not bad.  LVEF is normal   There is mod to severe TR noted  New  On exam, pt in NAD She is a petite 59 yo NEck  JVP is normal Lungs have rales at bases Cardiac Exam:   RRR III/VI systolic murmur LSB    No RV heave Abd  No RUQ tendernes Ext are without edema     Questions:  What is etiology of ? Syncope /near syncope / weakness   And 2. Why is  TR mod/severe ( Not this way in 2019) and is it related to ?syncope/weakness  Again, CT was neg for PE  I would recomm R heart cath to define pulmonary pressures   If high I would pursue VQ scan to r/o chronic PE   If not significantly elevated would consider MRI to eval RV /TV in greater detail.  The fact that pt is on dialysis makes some of findings difficult given that she is dependent on fluid removal only 3x per weak.   But, should not have severe pulmonary  HTN  Check orthostatics  CT  of chest did show aatherosclerosis  No symptoms to sugg angina.   Keep on ASA and liptor  Pt understands plans for procedure and agrees to proceed.  Dorris Carnes MD

## 2019-12-04 NOTE — Progress Notes (Signed)
   12/04/19 0030  Hand-Off documentation  Handoff Given Given to shift RN/LPN  Report given to (Full Name) Trenton Founds  Handoff Received Received from shift RN/LPN  Report received from (Full Name) Augustin Bun, RN  Vital Signs  Temp 98 F (36.7 C)  Temp Source Oral  Pulse Rate 81  Pulse Rate Source Monitor  Resp 16  BP (!) 175/71  BP Location Right Arm  BP Method Automatic  Patient Position (if appropriate) Lying  Oxygen Therapy  SpO2 98 %  O2 Device Nasal Cannula  O2 Flow Rate (L/min) 2 L/min  Pain Assessment  Pain Scale 0-10  Pain Score 0  Dialysis Weight  Weight 50.3 kg  Type of Weight Post-Dialysis  Post-Hemodialysis Assessment  Rinseback Volume (mL) 250 mL  KECN 246 V  Dialyzer Clearance Lightly streaked  Duration of HD Treatment -hour(s) 3.5 hour(s)  Hemodialysis Intake (mL) 500 mL  UF Total -Machine (mL) 2045 mL  Net UF (mL) 1545 mL  Tolerated HD Treatment Yes  Post-Hemodialysis Comments tx achieved as expected, pt stable,.  AVG/AVF Arterial Site Held (minutes) 15 minutes  AVG/AVF Venous Site Held (minutes) 15 minutes  Education / Care Plan  Dialysis Education Provided Yes  Fistula / Graft Left Upper arm Arteriovenous fistula  Placement Date/Time: 02/21/19 1356   Placed prior to admission: No  Orientation: Left  Access Location: (c) Upper arm  Access Type: (c) Arteriovenous fistula  Site Condition No complications  Fistula / Graft Assessment Present;Thrill;Bruit  Status Deaccessed

## 2019-12-04 NOTE — Progress Notes (Addendum)
[facility] Progress Note   Subjective:   Patient seen in room, sitting in recliner. States she cramped during dialysis yesterday. 1.5L removed. Reports ongoing wrist pain but states she does not want any pain medication. Denies SOB, orthopnea, CP, palpitations, abdominal pain, N/V/D. On O2 via nasal cannula  Objective Vitals:   12/04/19 0030 12/04/19 0130 12/04/19 0804 12/04/19 0834  BP: (!) 175/71 (!) 162/69 (!) 160/66 (!) 149/81  Pulse: 81 92 81   Resp: 16 16 18    Temp: 98 F (36.7 C) 98.5 F (36.9 C) 99 F (37.2 C) 98.1 F (36.7 C)  TempSrc: Oral Oral  Oral  SpO2: 98% 98% 100%   Weight: 50.3 kg     Height:       Physical Exam General: Well developed female, alert and in NAD Heart: RRR, no murmur Lungs: Crackles b/l bases, respirations unlabored, no wheezing auscultated Abdomen: Soft, non-tender, +BS Extremities: No edema b/l lower extremities Dialysis Access: [name] AVF +thrill/bruit  Additional Objective Labs: Basic Metabolic Panel: Recent Labs  Lab 12/02/19 1603 12/02/19 1603 12/02/19 1721 12/03/19 0329 12/04/19 0448  NA 135   < > 135 137 137  K 5.0   < > 4.7 4.4 3.5  CL 96*  --   --  98 98  CO2 27  --   --  24 28  GLUCOSE 299*  --   --  156* 196*  BUN 22*  --   --  28* 6  CREATININE 3.75*  --   --  4.31* 1.91*  CALCIUM 8.7*  --   --  8.6* 8.5*  PHOS  --   --   --   --  3.3   < > = values in this interval not displayed.   Liver Function Tests: Recent Labs  Lab 12/02/19 1603 12/04/19 0448  AST 20  --   ALT 22  --   ALKPHOS 103  --   BILITOT 0.5  --   PROT 7.5  --   ALBUMIN 3.6 3.3*   CBC: Recent Labs  Lab 12/02/19 1603 12/02/19 1603 12/02/19 1721 12/03/19 0329 12/04/19 0448  WBC 7.9  --   --  7.7 9.3  NEUTROABS 6.2  --   --   --   --   HGB 8.8*   < > 8.5* 8.5* 9.0*  HCT 28.6*   < > 25.0* 27.1* 28.2*  MCV 96.9  --   --  96.4 93.4  PLT 216  --   --  241 268   < > = values in this interval not displayed.   Blood Culture     Component Value Date/Time   SDES BLOOD RIGHT ANTECUBITAL 12/02/2019 1602   SPECREQUEST  12/02/2019 1602    BOTTLES DRAWN AEROBIC AND ANAEROBIC Blood Culture adequate volume   CULT  12/02/2019 1602    NO GROWTH 2 DAYS Performed at [facility], [place] [address], [place], [place] [address]    REPTSTATUS PENDING 12/02/2019 1602    CBG: Recent Labs  Lab 12/03/19 1638 12/03/19 1820 12/04/19 0133 12/04/19 0246 12/04/19 0804  GLUCAP 261* 217* 74 173* 147*    Studies/Results: DG Wrist Complete Right  Result Date: 12/03/2019 CLINICAL DATA:  Fall 1 week ago with right wrist pain. EXAM: RIGHT WRIST - COMPLETE 3+ VIEW COMPARISON:  08/31/2018 FINDINGS: Mild degenerate changes of the radiocarpal joint and radial side of the carpal bones/first carpometacarpal joint. Evidence of patient's old distal radial and ulnar metaphyseal fractures.  Subtle lucency at the base of the ulnar styloid which may be acute or chronic. IMPRESSION: Lucency at the base of the ulnar styloid which may be acute or chronic. Evidence of old distal radial/ulnar metaphyseal fractures. Electronically Signed   By: Marin Olp M.D.   On: 12/03/2019 11:49   CT HEAD WO CONTRAST  Result Date: 12/02/2019 CLINICAL DATA:  Altered mental status. EXAM: CT HEAD WITHOUT CONTRAST TECHNIQUE: Contiguous axial images were obtained from the base of the skull through the vertex without intravenous contrast. COMPARISON:  CT head dated April 28, 2019. FINDINGS: Brain: No evidence of acute infarction, hemorrhage, hydrocephalus, extra-axial collection or mass lesion/mass effect. Small bilateral basal ganglia chronic lacunar infarcts again noted. Stable mild atrophy and chronic microvascular ischemic changes. Vascular: Calcified atherosclerosis at the skullbase. No hyperdense vessel. Skull: Normal. Negative for fracture or focal lesion. Sinuses/Orbits: No acute finding. Chronic small vitreous hemorrhage in the posterior left globe. Other: None.  IMPRESSION: 1. No acute intracranial abnormality. Electronically Signed   By: Titus Dubin M.D.   On: 12/02/2019 18:56   CT Angio Chest PE W and/or Wo Contrast  Result Date: 12/02/2019 CLINICAL DATA:  Shortness of breath.  Altered mental status. EXAM: CT ANGIOGRAPHY CHEST WITH CONTRAST TECHNIQUE: Multidetector CT imaging of the chest was performed using the standard protocol during bolus administration of intravenous contrast. Multiplanar CT image reconstructions and MIPs were obtained to evaluate the vascular anatomy. CONTRAST:  80mL OMNIPAQUE IOHEXOL 350 MG/ML SOLN COMPARISON:  Radiograph earlier this day. FINDINGS: Cardiovascular: There are no filling defects within the pulmonary arteries to suggest pulmonary embolus. Aortic atherosclerosis. No aneurysm. Common origin of the brachiocephalic and left common carotid artery, variant arch anatomy. Mild multi chamber cardiomegaly. There is contrast refluxing into the hepatic veins and IVC. No pericardial effusion. Mediastinum/Nodes: No enlarged mediastinal or hilar lymph nodes. No thyroid nodule. Patulous esophagus with small hiatal hernia. No definite wall thickening. Lungs/Pleura: Dependent atelectasis. Subsegmental atelectasis in the lingula. No septal thickening or pulmonary edema. No pleural effusion. Trachea and mainstem bronchi are patent. Upper Abdomen: Contrast refluxing into the hepatic veins and IVC. No other acute findings. Musculoskeletal: There are no acute or suspicious osseous abnormalities. Review of the MIP images confirms the above findings. IMPRESSION: 1. No pulmonary embolus. 2. Mild multi chamber cardiomegaly with contrast refluxing into the hepatic veins and IVC consistent with elevated right heart pressures. 3. Patulous esophagus with small hiatal hernia. Aortic Atherosclerosis (ICD10-I70.0). Electronically Signed   By: Keith Rake M.D.   On: 12/02/2019 23:42   DG Chest Port 1 View  Result Date: 12/02/2019 CLINICAL DATA:   Altered level of consciousness. EXAM: PORTABLE CHEST 1 VIEW COMPARISON:  04/28/2019 FINDINGS: Cardiomegaly. Unchanged mediastinal contours with aortic atherosclerosis. Previous right pleural effusion is diminished and is likely resolved. Bronchovascular crowding versus vascular congestion. No focal airspace disease or pneumothorax. No acute osseous abnormalities are seen IMPRESSION: Cardiomegaly. Bronchovascular crowding versus vascular congestion. Electronically Signed   By: Keith Rake M.D.   On: 12/02/2019 16:28   ECHOCARDIOGRAM COMPLETE  Result Date: 12/03/2019    ECHOCARDIOGRAM REPORT   Patient Name:   ALEEYAH BENSEN Date of Exam: 12/03/2019 Medical Rec #:  [id number]      Height:       54.0 in Accession #:    [id number]     Weight:       109.1 lb Date of Birth:  [birth year]      BSA:  1.337 m Patient Age:    41 years       BP:           139/56 mmHg Patient Gender: F              HR:           75 bpm. Exam Location:  Inpatient Procedure: 2D Echo, Cardiac Doppler and Color Doppler Indications:    Syncope 780.2 / R55  History:        Patient has prior history of Echocardiogram examinations, most                 recent 06/10/2018. Stroke; Risk Factors:Hypertension, Diabetes                 and Dyslipidemia. CKD (chronic kidney disease) stage 5, GFR less                 than 15 ml/min.  Sonographer:    Alvino Chapel RCS Referring Phys: [id number] [place]  1. Left ventricular ejection fraction, by estimation, is 55 to 60%. The left ventricle has normal function. The left ventricle has no regional wall motion abnormalities. Left ventricular diastolic parameters were normal.  2. Severe TR and RV dilatation suggest elevated right heart pressures consider CTA to r/o PE in setting of syncope . Right ventricular systolic function is mildly reduced. The right ventricular size is normal. There is mildly elevated pulmonary artery systolic pressure.  3. Right atrial size was mildly dilated.  4.  The mitral valve is normal in structure. Mild to moderate mitral valve regurgitation. No evidence of mitral stenosis.  5. 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  6. The aortic valve is normal in structure. Aortic valve regurgitation is not visualized. No aortic stenosis is present.  7. The inferior vena cava is normal in size with <50% respiratory variability, suggesting right atrial pressure of 8 mmHg. FINDINGS  Left Ventricle: Left ventricular ejection fraction, by estimation, is 55 to 60%. The left ventricle has normal function. The left ventricle has no regional wall motion abnormalities. The left ventricular internal cavity size was normal in size. There is  no left ventricular hypertrophy. Left ventricular diastolic parameters were normal. Right Ventricle: Severe TR and RV dilatation suggest elevated right heart pressures consider CTA to r/o PE in setting of syncope. The right ventricular size is normal. Right vetricular wall thickness was not assessed. Right ventricular systolic function is mildly reduced. There is mildly elevated pulmonary artery systolic pressure. The tricuspid regurgitant velocity is 3.00 m/s, and with an assumed right atrial pressure of 3 mmHg, the estimated right ventricular systolic pressure is 82.9 mmHg. Left Atrium: Left atrial size was normal in size. Right Atrium: Right atrial size was mildly dilated. Pericardium: There is no evidence of pericardial effusion. Mitral Valve: The mitral valve is normal in structure. There is mild thickening of the mitral valve leaflet(s). There is mild calcification of the mitral valve leaflet(s). Normal mobility of the mitral valve leaflets. Mild to moderate mitral valve regurgitation. No evidence of mitral valve stenosis. Tricuspid Valve: The tricuspid valve is normal in structure. 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 No evidence of tricuspid stenosis. Aortic Valve: The aortic valve is normal in structure. Aortic valve regurgitation is not  visualized. No aortic stenosis is present. Pulmonic Valve: The pulmonic valve was normal in structure. Pulmonic valve regurgitation is not visualized. No evidence of pulmonic stenosis. Aorta: The aortic root is normal in size and structure. Venous: The inferior  vena cava is normal in size with less than 50% respiratory variability, suggesting right atrial pressure of 8 mmHg. IAS/Shunts: No atrial level shunt detected by color flow Doppler.  LEFT VENTRICLE PLAX 2D LVIDd:         4.50 cm  Diastology LVIDs:         2.70 cm  LV e' lateral:   8.81 cm/s LV PW:         1.00 cm  LV E/e' lateral: 15.3 LV IVS:        1.00 cm  LV e' medial:    7.72 cm/s LVOT diam:     1.50 cm  LV E/e' medial:  17.5 LV SV:         48 LV SV Index:   36 LVOT Area:     1.77 cm  RIGHT VENTRICLE RV S prime:     12.60 cm/s TAPSE (M-mode): 2.2 cm LEFT ATRIUM             Index       RIGHT ATRIUM           Index LA diam:        3.60 cm 2.69 cm/m  RA Area:     16.70 cm LA Vol (A2C):   55.6 ml 41.58 ml/m RA Volume:   50.10 ml  37.47 ml/m LA Vol (A4C):   45.8 ml 34.25 ml/m LA Biplane Vol: 50.9 ml 38.07 ml/m  AORTIC VALVE LVOT Vmax:   110.00 cm/s LVOT Vmean:  72.000 cm/s LVOT VTI:    0.269 m  AORTA Ao Root diam: 2.80 cm MITRAL VALVE                 TRICUSPID VALVE MV Area (PHT): 4.31 cm      TR Peak grad:   36.0 mmHg MV Decel Time: 176 msec      TR Vmax:        300.00 cm/s MR Peak grad:    140.2 mmHg MR Mean grad:    100.0 mmHg  SHUNTS MR Vmax:         592.00 cm/s Systemic VTI:  0.27 m MR Vmean:        477.0 cm/s  Systemic Diam: 1.50 cm MR PISA:         1.01 cm MR PISA Eff ROA: 5 mm MR PISA Radius:  0.40 cm MV E velocity: 135.00 cm/s MV A velocity: 113.00 cm/s MV E/A ratio:  1.19 Jenkins Rouge MD Electronically signed by Jenkins Rouge MD Signature Date/Time: 12/03/2019/4:19:51 PM    Final    Medications:   aspirin  81 mg Oral Daily   atorvastatin  40 mg Oral QHS   Chlorhexidine Gluconate Cloth  6 each Topical Daily   Chlorhexidine  Gluconate Cloth  6 each Topical Q0600   darbepoetin (ARANESP) injection - DIALYSIS  40 mcg Intravenous Q Sat-HD   doxercalciferol  6 mcg Intravenous Q T,Th,Sa-HD   FLUoxetine  10 mg Oral Daily   gabapentin  100 mg Oral BID   heparin  5,000 Units Subcutaneous Q8H   insulin aspart  0-5 Units Subcutaneous QHS   insulin aspart  0-9 Units Subcutaneous TID WC   insulin aspart  12 Units Subcutaneous TID WC   metoprolol tartrate  100 mg Oral BID   pantoprazole  40 mg Oral Daily   polyethylene glycol  17 g Oral Daily   sevelamer carbonate  800 mg Oral TID WC   sodium chloride flush  3  mL Intravenous Q12H    Dialysis Orders: Center: [facility]  on TTS. 3 hours 45 min, 180NRe, 2K, 2.25Ca, AVF, BFR 350, DFR 800, UFR profile- none, sodium modeling-none hectorol 6 mcg IVP q HD Heparin 2000 unit bolus mircera 60 mcg IVP q HD- last dose 50 mcg on 5/25, Hgb 8.7 on 12/01/19  Assessment/Plan: 1.  Syncope/AMS: BP slightly low on admission with hypoxia, CXR with vascular congestion. Alert and oriented this AM. She has had AMS with hypoxia in the past. Will plan for HD today for volume management. Also recently started moxifloxacin and oxycodone at SNF, which may have been for UTI and wrist pain respectively per pt report. UA on 12/02/19 with high glucose but otherwise not suspicious for a UTI. Cardiology consulted as well.  2.  ESRD:  TTS schedule, missed HD 6/8. Will plan HD this afternoon for volume mgt as above.  3.  Hypertension/volume: BP low on admission, now high/normal. Did not meet full UF goal yesterday due to cramping, still with some crackles in lung bases. May need extra HD tomorrow if volume status worsens, reeval in AM.   4.  Anemia: Hgb 8.5 > 9.0 today. Given ESA yesterday. 5.  Metabolic bone disease: calcium controlled,  phos at goal. Continue hectorol and renvela.  6.  Nutrition:  Alb 3.3. Renal diet with fluid restrictions.  Anice Paganini,  PA-C 12/04/2019, 11:28 AM  [facility] Pager: [phone]  Nephrology attending:   Patient was seen and examined at bedside.  Chart reviewed.  I agree with above.  Received dialysis and tolerated well.  Looks comfortable.  No need for dialysis today.  We will assess tomorrow morning for extra treatment.  Noted cardiology is consulted and may consider heart cath.  Katheran James, MD [facility]

## 2019-12-04 NOTE — Evaluation (Signed)
Occupational Therapy Evaluation Patient Details Name: [name] Michael MRN: [id number] DOB: 12/02/1960 Today's Date: 12/04/2019    History of Present Illness 59 y.o. female with PMH of HTN, HLD, DM, ESRD dialysis TTS, and depression who presented to ED 12/02/19 for AMS and admitted for hypoxia and syncope workup. CT head and CTA chest no acute changes.    Clinical Impression   This 59 y/o female presents with the above. PTA pt residing in [place]. Pt reports completing ADL, some iADL and mobility tasks at mod independent level. Pt currently completing ADL/mobility tasks at minA level using RW. Pt completing room level mobility, toileting ADL and seated grooming ADL during this session. Pt with limitations including RUE pain, decreased fine motor strength/coordination, impaired balance, overall weakness and ?cognitive impairments. Pt on 2L O2 throughout with SpO2 >95%. She will benefit from continued acute OT services and currently recommend follow up OT services at SNF level after discharge to maximize her overall safety and independence with ADL and mobility. Will follow.     Follow Up Recommendations  SNF;Supervision/Assistance - 24 hour    Equipment Recommendations  Other (comment) (defer to next venue)           Precautions / Restrictions Precautions Precautions: Fall Precaution Comments: had recent fall vs ?syncope with rt 4th finger fracture Restrictions Weight Bearing Restrictions: No      Mobility Bed Mobility Overal bed mobility: Needs Assistance Bed Mobility: Supine to Sit     Supine to sit: Supervision     General bed mobility comments: for safety, HOB elevated   Transfers Overall transfer level: Needs assistance Equipment used: Rolling walker (2 wheeled) Transfers: Sit to/from Stand Sit to Stand: Min assist         General transfer comment: assist to stabilize upon standing, stood from EOB and toilet     Balance Overall balance assessment: History  of Falls;Needs assistance Sitting-balance support: Feet unsupported Sitting balance-Leahy Scale: Fair     Standing balance support: Bilateral upper extremity supported Standing balance-Leahy Scale: Poor                             ADL either performed or assessed with clinical judgement   ADL Overall ADL's : Needs assistance/impaired Eating/Feeding: Set up;Sitting   Grooming: Minimal assistance;Sitting;Brushing hair Grooming Details (indicate cue type and reason): pt using L hand due to painful RUE, with assist for thoroughness Upper Body Bathing: Minimal assistance;Sitting   Lower Body Bathing: Minimal assistance;Sit to/from stand   Upper Body Dressing : Minimal assistance;Sitting   Lower Body Dressing: Minimal assistance;Sit to/from stand   Toilet Transfer: Minimal assistance;Ambulation;RW;Regular Toilet   Toileting- Clothing Manipulation and Hygiene: Minimal assistance;Sitting/lateral lean;Sit to/from stand Toileting - Clothing Manipulation Details (indicate cue type and reason): assist for balance/gown management      Functional mobility during ADLs: Minimal assistance;Rolling walker;Cueing for safety                           Pertinent Vitals/Pain Pain Assessment: Faces Faces Pain Scale: Hurts little more Pain Location: with use of RUE Pain Descriptors / Indicators: Discomfort;Sore Pain Intervention(s): Monitored during session;Repositioned     Hand Dominance Right   Extremity/Trunk Assessment Upper Extremity Assessment Upper Extremity Assessment: Generalized weakness;RUE deficits/detail RUE Deficits / Details: painful R hand due to recent fall, bruising noted to lateral aspect of hand/forearm  RUE: Unable to fully assess due to  pain RUE Coordination: decreased fine motor   Lower Extremity Assessment Lower Extremity Assessment: Defer to PT evaluation   Cervical / Trunk Assessment Cervical / Trunk Assessment: Normal   Communication  Communication Communication: No difficulties   Cognition Arousal/Alertness: Awake/alert Behavior During Therapy: Flat affect Overall Cognitive Status: No family/caregiver present to determine baseline cognitive functioning                                 General Comments: Slow processing   General Comments  pt on 2L O2 during session, SpO2 >95% pre and post activity     Exercises     Shoulder Instructions      Home Living Family/patient expects to be discharged to:: Skilled nursing facility                                 Additional Comments: Per pt from [facility]--?SNF vs LTC level of care      Prior Functioning/Environment Level of Independence: Needs assistance  Gait / Transfers Assistance Needed: reports utilizing a cane ADL's / Homemaking Assistance Needed: reports typically completing ADL tasks on her own, does her own laundry, facility provides meals             OT Problem List: Decreased strength;Decreased range of motion;Decreased activity tolerance;Impaired balance (sitting and/or standing);Decreased knowledge of use of DME or AE;Pain;Impaired UE functional use;Decreased cognition;Decreased coordination      OT Treatment/Interventions: Self-care/ADL training;Therapeutic exercise;Energy conservation;DME and/or AE instruction;Therapeutic activities;Patient/family education;Balance training;Cognitive remediation/compensation    OT Goals(Current goals can be found in the care plan section) Acute Rehab OT Goals Patient Stated Goal: for her hand to stop hurting OT Goal Formulation: With patient Time For Goal Achievement: 12/18/19 Potential to Achieve Goals: Good  OT Frequency: Min 2X/week   Barriers to D/C:            Co-evaluation              AM-PAC OT "6 Clicks" Daily Activity     Outcome Measure Help from another person eating meals?: A Little Help from another person taking care of personal grooming?: A Little Help  from another person toileting, which includes using toliet, bedpan, or urinal?: A Little Help from another person bathing (including washing, rinsing, drying)?: A Little Help from another person to put on and taking off regular upper body clothing?: A Little Help from another person to put on and taking off regular lower body clothing?: A Little 6 Click Score: 18   End of Session Equipment Utilized During Treatment: Gait belt;Rolling walker;Oxygen Nurse Communication: Mobility status  Activity Tolerance: Patient tolerated treatment well Patient left: in chair;with call bell/phone within reach;with chair alarm set  OT Visit Diagnosis: Muscle weakness (generalized) (M62.81);Unsteadiness on feet (R26.81);History of falling (Z91.81)                Time: 3295-1884 OT Time Calculation (min): 23 min Charges:  OT General Charges $OT Visit: 1 Visit OT Evaluation $OT Eval Moderate Complexity: 1 Mod OT Treatments $Self Care/Home Management : 8-22 mins  Lou Cal, OT Acute Rehabilitation Services Pager [phone] Office [place] 12/04/2019, 1:02 PM

## 2019-12-04 NOTE — Consult Note (Addendum)
Cardiology Consult    Patient ID: [name] Michael MRN: [id number], DOB/AGE: 1961/04/24   Admit date: 12/02/2019 Date of Consult: 12/04/2019  Primary Physician: [name] Rakes, MD Primary Cardiologist: [name] Carnes, MD - new Requesting Provider: D. Grandville Silos, MD  Patient Profile    [name] Michael is a 59 y.o. female with a history of HTN, HL, stroke, TIA, DMII, ongoing tob abuse, ESRD on HD, depression, and GERD, who is being seen today for the evaluation of syncope and right heart failure w/ severe TR at the request of Dr. Grandville Michael.  Past Medical History   Past Medical History:  Diagnosis Date  . Ambulates with cane   . Constipation   . Depression   . ESRD (end stage renal disease) ([place])    a. TTS Dialysis.  Marland Kitchen GERD (gastroesophageal reflux disease)   . History of stress test    a. 01/2003 MV: EF 74%, no ischemia/infarct.  . Hyperlipidemia   . Hypertension   . Osteoporosis   . Stroke [facility]) 04-01-11   left frontal subcortical, saw Dr. Leonie Michael   . Syncope 11/2019  . TIA (transient ischemic attack) 03-12-11  . Tobacco abuse   . Tricuspid regurgitation    a. 05/2016 Echo: EF 65-70%, Gr2DD, mild MR, nl RV fxn, Triv TR, PASP 21mmHg; b. 05/2018 Echo: EF 60-65%, Gr2DD, mild MR/TR, RVSP/PASP 2mmHg; c. 11/2019 Echo: EF 55-60%, no rwma, mild-mod MR, Sev TR w/ RV dilatation (CTA chest neg for PE).  . Type II diabetes mellitus ([place])   . Vitamin D deficiency     Past Surgical History:  Procedure Laterality Date  . AV FISTULA PLACEMENT Left 02/21/2019   Procedure: BRACHIOCEPHALIC ARTERIOVENOUS (AV) FISTULA CREATION;  Surgeon: [name], MD;  Location: [place];  Service: Vascular;  Laterality: Left;  . BASCILIC VEIN TRANSPOSITION Left 04/11/2019   Procedure: SECOND STAGE BASILIC VEIN TRANSPOSITION LEFT ARM;  Surgeon: [name], MD;  Location: [place];  Service: Vascular;  Laterality: Left;  . IR FLUORO GUIDE CV LINE RIGHT  04/29/2019  . IR US GUIDE VASC ACCESS RIGHT  04/29/2019    . OPEN REDUCTION INTERNAL FIXATION (ORIF) DISTAL RADIAL FRACTURE Left 01/28/2016   Procedure: OPEN REDUCTION INTERNAL FIXATION (ORIF) DISTAL RADIAL FRACTURE;  Surgeon: Iran Planas, MD;  Location: [place];  Service: Orthopedics;  Laterality: Left;  . [place] ADENOIDECTOMY     age 21     Allergies  Allergies  Allergen Reactions  . Hydrocodone Nausea And Vomiting    Listed in Epic, but not on the [facility]    History of Present Illness    59 y/o ? w/ the above complex PMH including HTN, HL, DMII, ESRD on HD, stroke, TIA, ongoing tob abuse, GERD, and depression.  She previously underwent stress testing in 2004 in the setting of chest pain, which was low-risk/non-ischemic.  Prior echocardiograms, performed in the setting of sepsis in 05/2016 and in the setting of syncope/dehydration in 05/2018, showed normal LV function and grade 2 diastolic dysfunction.  She had mild MR and TR on echo in 05/2018 w/ PASP of 6mmHg.  She was most recently admitted in 04/2019 in the setting of hypoxia and volume overload following tunneled catheter placement for dialysis access.  HD was initiated at that time.  A V:Q scan was performed during that admission and showed no perfusion defects.  Since initiating HD late last Fall, [name] Michael has generally done well.  She does experience fatigue and occasionally low blood pressures and lightheadedness on HD days, but on non-HD days, she usually walks for ~ 20 mins w/o chest pain, dyspnea, or presyncope.  Last Saturday, 6/5, she slipped on a wet floor at her assisted living facility, and fell.  She didn't immediately notice any discomfort.  Overnight, she fell out of her bed (she's not sure what occurred).  On the morning of June 6, she noted right hand pain.  Pain was fairly severe, and she says that on Tuesday, June 8, she did not feel well, partly related to hand pain, and she skipped dialysis.  She also reported to staff  dysuria.  She was placed on moxifloxacin and oxycodone and believes she did not receive her first dose of oxycodone until June 11.  She says that after receiving pain medicine that day, she was standing in the laundry room, doing her laundry, when she began feeling very warm and sleepy.  She left the laundry room and walked to the cafeteria, which she says is about a 1 minute walk.  At no point did she feel chest pain, dyspnea, lightheadedness/dizziness, or palpitations.  When she sat in the cafeteria, she rested her head on the table and believes that she either fell asleep or lost consciousness.  There were many residents and staff members around her and EMS was called.  She does not have any recollection of what occurred prior to EMS arrival.  Per notes, upon EMS arrival she was found to be hypoxic with saturations in the low 80s.  She was placed on 2 L via nasal cannula.  Altered mental status was also reported.    She was taken to the [facility] ED where her temperature was 95.9.  She was noted to have stable anemia with a hemoglobin of 8.8 and hematocrit of 28.6.  WBCs were normal at 7.9.  Lactate was mildly elevated at 2.3, though procalcitonin was normal at 0.40.  Creatinine was 3.75 on arrival.  BNP was elevated at 1275.7.  High-sensitivity troponin = 8.  Chest x-[name] showed cardiomegaly with bronchovascular crowding versus less congestion.  CT of the head was negative for acute abnormality.  CTA of the chest was negative for PE.  Mild multichamber cardiomegaly with contrast refluxing into the hepatic veins and IVC were noted, consistent with elevated right heart pressures.  She was admitted and underwent echocardiography yesterday, June 12, revealing normal LV function and RV dilatation with an RVSP of 39 mmHg with severe tricuspid regurgitation.  Mild to moderate mitral regurgitation were also noted.  She was seen by nephrology and underwent dialysis yesterday evening.  This morning, she says she feels sleepy  but denies any chest pain or dyspnea.  We have been asked to evaluate secondary to right heart failure and severe tricuspid regurgitation.  Inpatient Medications    . aspirin  81 mg Oral Daily  . atorvastatin  40 mg Oral QHS  . Chlorhexidine Gluconate Cloth  6 each Topical Daily  . Chlorhexidine Gluconate Cloth  6 each Topical Q0600  . darbepoetin (ARANESP) injection - DIALYSIS  40 mcg Intravenous Q Sat-HD  . doxercalciferol  6 mcg Intravenous Q T,Th,Sa-HD  . FLUoxetine  10 mg Oral Daily  . gabapentin  100 mg Oral BID  . heparin  5,000 Units Subcutaneous Q8H  . insulin aspart  0-5 Units Subcutaneous QHS  . insulin aspart  0-9 Units Subcutaneous TID WC  . insulin aspart  12 Units Subcutaneous TID WC  .  metoprolol tartrate  100 mg Oral BID  . pantoprazole  40 mg Oral Daily  . polyethylene glycol  17 g Oral Daily  . sevelamer carbonate  800 mg Oral TID WC  . sodium chloride flush  3 mL Intravenous Q12H    Family History    Family History  Problem Relation Age of Onset  . Stroke Mother   . Diabetes Mother   . Kidney failure Mother   . Heart failure Mother   . Stroke Father   . Cancer Sister        Breast- 39's   She indicated that her mother is deceased. She indicated that her father is deceased. She indicated that the status of her sister is unknown.   Social History    Social History   Socioeconomic History  . Marital status: Single    Spouse name: Not on file  . Number of children: Not on file  . Years of education: Not on file  . Highest education level: Not on file  Occupational History  . Not on file  Tobacco Use  . Smoking status: Current Every Day Smoker    Packs/day: 0.30    Years: 32.00    Pack years: 9.60    Types: Cigarettes    Last attempt to quit: 09/23/2012    Years since quitting: 7.2  . Smokeless tobacco: Never Used  . Tobacco comment: 8 cigarettes/day  Vaping Use  . Vaping Use: Never used  Substance and Sexual Activity  . Alcohol use: No  .  Drug use: No  . Sexual activity: Never    Birth control/protection: Post-menopausal  Other Topics Concern  . Not on file  Social History Narrative   Lives at [facility], Assisted living. Does not work.      Social Determinants of Health   Financial Resource Strain:   . Difficulty of Paying Living Expenses:   Food Insecurity:   . Worried About Charity fundraiser in the Last Year:   . Arboriculturist in the Last Year:   Transportation Needs:   . Film/video editor (Medical):   Marland Kitchen Lack of Transportation (Non-Medical):   Physical Activity:   . Days of Exercise per Week:   . Minutes of Exercise per Session:   Stress:   . Feeling of Stress :   Social Connections:   . Frequency of Communication with Friends and Family:   . Frequency of Social Gatherings with Friends and Family:   . Attends Religious Services:   . Active Member of Clubs or Organizations:   . Attends Archivist Meetings:   Marland Kitchen Marital Status:   Intimate Partner Violence:   . Fear of Current or Ex-Partner:   . Emotionally Abused:   Marland Kitchen Physically Abused:   . Sexually Abused:      Review of Systems    General:  No chills, fever, night sweats or weight changes.  Cardiovascular:  No chest pain, +++ occasional dyspnea on exertion, no edema, orthopnea, palpitations, paroxysmal nocturnal dyspnea.  +++? Syncope. Dermatological: No rash, lesions/masses Respiratory: No cough, +++ occasional dyspnea Urologic: No hematuria, +++ recent dysuria for which she was placed on abx last week. Abdominal:   No nausea, vomiting, diarrhea, bright red blood per rectum, melena, or hematemesis Neurologic:  No visual changes, +++ generalized wkns/sleepiness, +++ changes in mental status on day of admission. MSK: +++ recent fall w/ resultant R hand/wrist pain.   All other systems reviewed and are otherwise negative  except as noted above.  Physical Exam    Blood pressure (!) 149/81, pulse 81, temperature 98.1 F (36.7 C),  temperature source Oral, resp. rate 18, height 4\' 6"  (1.372 m), weight 50.3 kg, last menstrual period 03/06/2013, SpO2 100 %.  General: Pleasant, NAD Psych: Normal affect. Neuro: Alert and oriented X 3. Moves all extremities spontaneously. HEENT: Normal  Neck: Supple without JVD. AVF bruit heard over neck. Lungs:  Resp regular and unlabored, bibasilar crackles. Heart: RRR no s3, s4. AVF bruit heard over superior chest wall and tracks to the [name].  2/6 syst murmur @ the LLSB. Abdomen: Soft, non-tender, non-distended, BS + x 4.  Extremities: No clubbing, cyanosis or edema. DP/PT/Radials 2+ and equal bilaterally. + [name] bruit/thrill.  Labs    Cardiac Enzymes Recent Labs  Lab 12/03/19 0329  TROPONINIHS 8      Lab Results  Component Value Date   WBC 9.3 12/04/2019   HGB 9.0 (L) 12/04/2019   HCT 28.2 (L) 12/04/2019   MCV 93.4 12/04/2019   PLT 268 12/04/2019    Recent Labs  Lab 12/02/19 1603 12/02/19 1721 12/04/19 0448  NA 135   < > 137  K 5.0   < > 3.5  CL 96*   < > 98  CO2 27   < > 28  BUN 22*   < > 6  CREATININE 3.75*   < > 1.91*  CALCIUM 8.7*   < > 8.5*  PROT 7.5  --   --   BILITOT 0.5  --   --   ALKPHOS 103  --   --   ALT 22  --   --   AST 20  --   --   GLUCOSE 299*   < > 196*   < > = values in this interval not displayed.     Radiology Studies    DG Wrist Complete Right  Result Date: 12/03/2019 CLINICAL DATA:  Fall 1 week ago with right wrist pain. EXAM: RIGHT WRIST - COMPLETE 3+ VIEW COMPARISON:  08/31/2018 FINDINGS: Mild degenerate changes of the radiocarpal joint and radial side of the carpal bones/first carpometacarpal joint. Evidence of patient's old distal radial and ulnar metaphyseal fractures. Subtle lucency at the base of the ulnar styloid which may be acute or chronic. IMPRESSION: Lucency at the base of the ulnar styloid which may be acute or chronic. Evidence of old distal radial/ulnar metaphyseal fractures. Electronically Signed   By: Marin Olp M.D.    On: 12/03/2019 11:49   CT HEAD WO CONTRAST  Result Date: 12/02/2019 CLINICAL DATA:  Altered mental status. EXAM: CT HEAD WITHOUT CONTRAST TECHNIQUE: Contiguous axial images were obtained from the base of the skull through the vertex without intravenous contrast. COMPARISON:  CT head dated April 28, 2019. FINDINGS: Brain: No evidence of acute infarction, hemorrhage, hydrocephalus, extra-axial collection or mass lesion/mass effect. Small bilateral basal ganglia chronic lacunar infarcts again noted. Stable mild atrophy and chronic microvascular ischemic changes. Vascular: Calcified atherosclerosis at the skullbase. No hyperdense vessel. Skull: Normal. Negative for fracture or focal lesion. Sinuses/Orbits: No acute finding. Chronic small vitreous hemorrhage in the posterior left globe. Other: None. IMPRESSION: 1. No acute intracranial abnormality. Electronically Signed   By: Titus Dubin M.D.   On: 12/02/2019 18:56   CT Angio Chest PE W and/or Wo Contrast  Result Date: 12/02/2019 CLINICAL DATA:  Shortness of breath.  Altered mental status. EXAM: CT ANGIOGRAPHY CHEST WITH CONTRAST TECHNIQUE: Multidetector CT imaging of the chest was  performed using the standard protocol during bolus administration of intravenous contrast. Multiplanar CT image reconstructions and MIPs were obtained to evaluate the vascular anatomy. CONTRAST:  57mL OMNIPAQUE IOHEXOL 350 MG/ML SOLN COMPARISON:  Radiograph earlier this day. FINDINGS: Cardiovascular: There are no filling defects within the pulmonary arteries to suggest pulmonary embolus. Aortic atherosclerosis. No aneurysm. Common origin of the brachiocephalic and left common carotid artery, variant arch anatomy. Mild multi chamber cardiomegaly. There is contrast refluxing into the hepatic veins and IVC. No pericardial effusion. Mediastinum/Nodes: No enlarged mediastinal or hilar lymph nodes. No thyroid nodule. Patulous esophagus with small hiatal hernia. No definite wall  thickening. Lungs/Pleura: Dependent atelectasis. Subsegmental atelectasis in the lingula. No septal thickening or pulmonary edema. No pleural effusion. Trachea and mainstem bronchi are patent. Upper Abdomen: Contrast refluxing into the hepatic veins and IVC. No other acute findings. Musculoskeletal: There are no acute or suspicious osseous abnormalities. Review of the MIP images confirms the above findings. IMPRESSION: 1. No pulmonary embolus. 2. Mild multi chamber cardiomegaly with contrast refluxing into the hepatic veins and IVC consistent with elevated right heart pressures. 3. Patulous esophagus with small hiatal hernia. Aortic Atherosclerosis (ICD10-I70.0). Electronically Signed   By: Keith Rake M.D.   On: 12/02/2019 23:42   DG Chest Port 1 View  Result Date: 12/02/2019 CLINICAL DATA:  Altered level of consciousness. EXAM: PORTABLE CHEST 1 VIEW COMPARISON:  04/28/2019 FINDINGS: Cardiomegaly. Unchanged mediastinal contours with aortic atherosclerosis. Previous right pleural effusion is diminished and is likely resolved. Bronchovascular crowding versus vascular congestion. No focal airspace disease or pneumothorax. No acute osseous abnormalities are seen IMPRESSION: Cardiomegaly. Bronchovascular crowding versus vascular congestion. Electronically Signed   By: Keith Rake M.D.   On: 12/02/2019 16:28   ECG & Cardiac Imaging    RSR, 63, RAD, RAE, LPFB, RBBB, mild inflat ST depression. No acute changes - personally reviewed.  Assessment & Plan    1.  Right Ventricular Dilatation/Severe Tricuspid Regurgitation:  Pt w/o significant prior cardiac history, who presented 6/11 in the setting of AMS and ? Syncope that occurred after taking oxycodone for right hand/wrist pain earlier in the day.  Pt was hypoxic on EMS arrival w/ O2 sats reported in the low 80's.  CT Head neg for acute intracranial abnormality.  CTA chest was neg for PE, though did show mild multi-chamber cardiomegaly w/ contrast  refluxing into the hepatic veins and IVC consistent w/ elevated R heart pressures.  Echo on 6/12 showed RV dilatation w/ RVSP of 3mmHg and Severe TR.  Prior echo in 05/2018 showed RVSP of 40mmHg w/ mild TR.  Notably, she did miss HD on Tues 6/8, though was dialyzed on 6/10 and again last night (echo was prior to HD last night).  She walks for 20 min 3x/wk w/o symptoms or limitations @ home.  To determine role of TR and whether it is a primary issue or 2/2 elevated PA/RV pressures, we will arrange for a RHC.  If pressures elevated, would pursue V:Q scan to r/o chronic PE (neg V:Q in Nov, so would have had to develop since then).  ? Role of missing HD on RV volume overload, though RVSP not much higher than on prior echo in 05/2019 and no evidence of L heart failure on admission/exam.  Also must question potential role of [name] AVF w/ L  R shunting and increased preload - RHC and confirming filling pressures will assist in sorting this out as well.  2.  AMS/? Syncope:  Pt says that she  felt warm and then very sleepy prior to potentially losing consciousness.  She denies experiencing lightheadedness or a sense that she might pass out.  No nausea/vomiting/diaphoresis.  She walked from the laundry room @ her facility to the cafeteria and remembers placing her head on a table b/c she was so tired.  She was found to be hypoxic.  As above, w/u neg for CVA/PE.  She cont to feel sleepy but had HD late yesterday evening.  ECG w/o acute findings.  No acute events on tele.  Echo w/ nl LVEF, making ventricular arrhythmias less likely.  Can consider outpt event/Zio monitoring to further exclude arrhythmia as a potential cause of AMS/syncope, though based on her description, I question role of narcotic.  3.  ESRD:  On TTS HD.  Per nephrology.  ? Role of [name] AVF on elevated RV pressures and TR (RHC as above).  4.  Essential HTN:  BP elevated this AM, though she tends to run low @/after HD. Cont  blocker.  5.  HL:  Cont statin  rx.  F/u as outpt.  6.  DMII:  Poorly controlled.  A1c 8.8.  Per IM.  7. H/o Stroke:  On ASA/statin.  9.  Ongoing tobacco abuse:  Still smoking 8 cigarettes/day. Cessation advised.  She says that there's nothing we can say/do to get her to consider quitting.  Signed, Murray Hodgkins, NP 12/04/2019, 11:03 AM  For questions or updates, please contact   Please consult [email or web address] for contact info under Cardiology/STEMI.  Patient seen and examined   I agree with findings as noted above by Angelica Ran. Pt is a 59 yo with Hx of HTN, ESRD, DM, Hx stroke/TIA.   She was admitted yesterday with "syncope" The pt says she gets weak sometimes, her legs give out and she can fall.   She gets occasionally low BP / dizzy on HD days  But OK on nondialysis days.   Skipped dialysis on 6/8 but had it on June 10 (Thurs)   On June 11 had taken oxycodone for pain in hand.  While standing at door she became weak   Went to cafeteria and put head on table  Per EMS sats were in low 80s   Placed on O2 [place] Brought to [facility] Here, CT neg for PE  Echo done   I have reviewed.   RV is mildly dilated.  OVerall RVEF not bad.  LVEF is normal   There is mod to severe TR noted  New  On exam, pt in NAD She is a petite 59 yo NEck  JVP is normal Lungs have rales at bases Cardiac Exam:   RRR III/VI systolic murmur LSB    No RV heave Abd  No RUQ tendernes Ext are without edema     Questions:  What is etiology of ? Syncope /near syncope / weakness   And 2. Why is  TR mod/severe ( Not this way in 2019) and is it related to ?syncope/weakness  Again, CT was neg for PE  I would recomm R heart cath to define pulmonary pressures   If high I would pursue VQ scan to r/o chronic PE   If not significantly elevated would consider MRI to eval RV /TV in greater detail.  The fact that pt is on dialysis makes some of findings difficult given that she is dependent on fluid removal only 3x per weak.   But, should not have severe pulmonary  HTN  Check orthostatics  CT  of chest did show aatherosclerosis  No symptoms to sugg angina.   Keep on ASA and liptor  Pt understands plans for procedure and agrees to proceed.  [name] Carnes MD

## 2019-12-04 NOTE — Progress Notes (Signed)
Pt left for dialysis at this time.  0100 Pt returned to floor from dialysis, report  received from Nurse Obas RN prior to pt arrival to floor. Pt aox3 and verbally responsive, snacks given to pt upon return. Will cont to monitor.

## 2019-12-05 ENCOUNTER — Inpatient Hospital Stay (HOSPITAL_COMMUNITY)
Admission: AD | Disposition: A | Discharge status: Skilled Nursing Facility | Payer: Self-pay | Source: Skilled Nursing Facility | Attending: Internal Medicine

## 2019-12-05 ENCOUNTER — Encounter (HOSPITAL_COMMUNITY): Payer: Self-pay | Admitting: Internal Medicine

## 2019-12-05 DIAGNOSIS — I50813 Acute on chronic right heart failure: Secondary | ICD-10-CM

## 2019-12-05 HISTORY — PX: RIGHT HEART CATH: CATH118263

## 2019-12-05 LAB — RENAL FUNCTION PANEL
Albumin: 3.3 g/dL — ABNORMAL LOW (ref 3.5–5.0)
Anion gap: 11 (ref 5–15)
BUN: 27 mg/dL — ABNORMAL HIGH (ref 6–20)
CO2: 27 mmol/L (ref 22–32)
Calcium: 9.2 mg/dL (ref 8.9–10.3)
Chloride: 98 mmol/L (ref 98–111)
Creatinine, Ser: 4.37 mg/dL — ABNORMAL HIGH (ref 0.44–1.00)
GFR calc Af Amer: 12 mL/min — ABNORMAL LOW (ref >60)
GFR calc non Af Amer: 10 mL/min — ABNORMAL LOW (ref >60)
Glucose, Bld: 168 mg/dL — ABNORMAL HIGH (ref 70–99)
Phosphorus: 4.3 mg/dL (ref 2.5–4.6)
Potassium: 4.5 mmol/L (ref 3.5–5.1)
Sodium: 136 mmol/L (ref 135–145)

## 2019-12-05 LAB — CBC WITH DIFFERENTIAL/PLATELET
Abs Immature Granulocytes: 0.06 10*3/uL (ref 0.00–0.07)
Basophils Absolute: 0.1 10*3/uL (ref 0.0–0.1)
Basophils Relative: 1 %
Eosinophils Absolute: 0.3 10*3/uL (ref 0.0–0.5)
Eosinophils Relative: 4 %
HCT: 28.5 % — ABNORMAL LOW (ref 36.0–46.0)
Hemoglobin: 9 g/dL — ABNORMAL LOW (ref 12.0–15.0)
Immature Granulocytes: 1 %
Lymphocytes Relative: 22 %
Lymphs Abs: 1.7 10*3/uL (ref 0.7–4.0)
MCH: 29.8 pg (ref 26.0–34.0)
MCHC: 31.6 g/dL (ref 30.0–36.0)
MCV: 94.4 fL (ref 80.0–100.0)
Monocytes Absolute: 0.7 10*3/uL (ref 0.1–1.0)
Monocytes Relative: 9 %
Neutro Abs: 5 10*3/uL (ref 1.7–7.7)
Neutrophils Relative %: 63 %
Platelets: 290 10*3/uL (ref 150–400)
RBC: 3.02 MIL/uL — ABNORMAL LOW (ref 3.87–5.11)
RDW: 16 % — ABNORMAL HIGH (ref 11.5–15.5)
WBC: 7.8 10*3/uL (ref 4.0–10.5)
nRBC: 0 % (ref 0.0–0.2)

## 2019-12-05 LAB — MRSA PCR SCREENING: MRSA by PCR: POSITIVE — AB

## 2019-12-05 LAB — GLUCOSE, CAPILLARY
Glucose-Capillary: 184 mg/dL — ABNORMAL HIGH (ref 70–99)
Glucose-Capillary: 166 mg/dL — ABNORMAL HIGH (ref 70–99)
Glucose-Capillary: 183 mg/dL — ABNORMAL HIGH (ref 70–99)
Glucose-Capillary: 86 mg/dL (ref 70–99)

## 2019-12-05 LAB — POCT I-STAT EG7
Acid-Base Excess: 3 mmol/L — ABNORMAL HIGH (ref 0.0–2.0)
Bicarbonate: 30 mmol/L — ABNORMAL HIGH (ref 20.0–28.0)
Calcium, Ion: 1.26 mmol/L (ref 1.15–1.40)
HCT: 27 % — ABNORMAL LOW (ref 36.0–46.0)
Hemoglobin: 9.2 g/dL — ABNORMAL LOW (ref 12.0–15.0)
O2 Saturation: 66 %
Potassium: 4.7 mmol/L (ref 3.5–5.1)
Sodium: 140 mmol/L (ref 135–145)
TCO2: 32 mmol/L (ref 22–32)
pCO2, Ven: 55.6 mmHg (ref 44.0–60.0)
pH, Ven: 7.34 (ref 7.250–7.430)
pO2, Ven: 37 mmHg (ref 32.0–45.0)

## 2019-12-05 SURGERY — RIGHT HEART CATH

## 2019-12-05 MED ORDER — SODIUM CHLORIDE 0.9 % IV SOLN: INTRAVENOUS | Status: DC

## 2019-12-05 MED ORDER — LIDOCAINE HCL (PF) 1 % IJ SOLN
INTRAMUSCULAR | Status: DC | PRN
  Administered 2019-12-05: 10 mL

## 2019-12-05 MED ORDER — SODIUM CHLORIDE 0.9 % IV SOLN: 250.0000 mL | INTRAVENOUS | Status: DC | PRN

## 2019-12-05 MED ORDER — SODIUM CHLORIDE 0.9% FLUSH: 3.0000 mL | INTRAVENOUS | Status: DC | PRN

## 2019-12-05 MED ORDER — HEPARIN SODIUM (PORCINE) 1000 UNIT/ML IJ SOLN
INTRAMUSCULAR | Status: AC
  Filled 2019-12-05: qty 1

## 2019-12-05 MED ORDER — HEPARIN SODIUM (PORCINE) 5000 UNIT/ML IJ SOLN
5000.0000 [IU] | Freq: Three times a day (TID) | INTRAMUSCULAR | Status: DC
  Administered 2019-12-05 – 2019-12-07 (×6): 5000 [IU] via SUBCUTANEOUS
  Filled 2019-12-05 (×6): qty 1

## 2019-12-05 MED ORDER — LIDOCAINE HCL (PF) 1 % IJ SOLN
INTRAMUSCULAR | Status: AC
  Filled 2019-12-05: qty 30

## 2019-12-05 MED ORDER — BACITRACIN ZINC 500 UNIT/GM EX OINT
TOPICAL_OINTMENT | Freq: Two times a day (BID) | CUTANEOUS | Status: DC
  Filled 2019-12-05: qty 28.4

## 2019-12-05 MED ORDER — CHLORHEXIDINE GLUCONATE CLOTH 2 % EX PADS
6.0000 | MEDICATED_PAD | Freq: Every day | CUTANEOUS | Status: DC
  Administered 2019-12-06: 6 via TOPICAL

## 2019-12-05 MED ORDER — SODIUM CHLORIDE 0.9% FLUSH
3.0000 mL | Freq: Two times a day (BID) | INTRAVENOUS | Status: DC
  Administered 2019-12-05 – 2019-12-06 (×4): 3 mL via INTRAVENOUS

## 2019-12-05 MED ORDER — HEPARIN (PORCINE) IN NACL 1000-0.9 UT/500ML-% IV SOLN
INTRAVENOUS | Status: DC | PRN
  Administered 2019-12-05: 500 mL

## 2019-12-05 MED ORDER — VERAPAMIL HCL 2.5 MG/ML IV SOLN
INTRAVENOUS | Status: AC
  Filled 2019-12-05: qty 2

## 2019-12-05 MED ORDER — HYDRALAZINE HCL 20 MG/ML IJ SOLN: 10.0000 mg | INTRAMUSCULAR | Status: AC | PRN

## 2019-12-05 MED ORDER — HEPARIN (PORCINE) IN NACL 1000-0.9 UT/500ML-% IV SOLN
INTRAVENOUS | Status: AC
  Filled 2019-12-05: qty 1000

## 2019-12-05 MED ORDER — LABETALOL HCL 5 MG/ML IV SOLN: 10.0000 mg | INTRAVENOUS | Status: AC | PRN

## 2019-12-05 SURGICAL SUPPLY — 4 items
CATH SWAN GANZ 7F STRAIGHT (CATHETERS) ×1 IMPLANT
KIT MICROPUNCTURE NIT STIFF (SHEATH) ×1 IMPLANT
SHEATH PINNACLE 7F 10CM (SHEATH) ×1 IMPLANT
TUBING ART PRESS 72  MALE/MALE (TUBING) ×1 IMPLANT

## 2019-12-05 NOTE — Progress Notes (Signed)
Brief cardiology follow up note:  Underwent RHC today:  Right Heart Pressures RA (mean): 12 mmHg RV (S/EDP): 50/13 mmHg PA (S/D, mean): 50/25 (33) mmHg PCWP (mean): 20 mmHg  Ao sat: 94% PA sat: 65%  Fick CO: 5.3 L/min Fick CI: 3.9 L/min/m^2  Thermodilution CO: 4.8 L/min Thermodilution CI: 3.5 L/min/m^2  PVR: 2.5 (Fick) and 2.7 (thermodilution) Wood units   Normal pulmonary vascular resistance, normal cardiac output. Mildly elevated filling pressures. Does not suggest that this is primary pulmonary hypertension. Mean PA pressure 33, wedge 20 mmHg.   Will defer to nephrology, but would trial increased volume removal with dialysis and then repeat echo.   No clear cardiac cause of syncope based on available data.  Will continue to monitor.  Buford Dresser, MD, PhD [facility]  [address] [place], [place] [address] [phone]

## 2019-12-05 NOTE — Interval H&P Note (Signed)
History and Physical Interval Note:  12/05/2019 11:00 AM  [name] Michael  has presented today for surgery, with the diagnosis of right heart failure.  The various methods of treatment have been discussed with the patient and family. After consideration of risks, benefits and other options for treatment, the patient has consented to  Procedure(s): RIGHT HEART CATH (N/A) as a surgical intervention.  The patient's history has been reviewed, patient examined, no change in status, stable for surgery.  I have reviewed the patient's chart and labs.  Questions were answered to the patient's satisfaction.     [name]

## 2019-12-05 NOTE — Progress Notes (Signed)
PROGRESS NOTE    [name] Michael  HCW:[id number] DOB: May 27, 1961 DOA: 12/02/2019 PCP: Charlott Rakes, MD    Chief Complaint  Patient presents with   Altered Mental Status    Brief Narrative:  HPI per Dr. [name] is a 59 y.o. female with PMH of HTN, HLD, DM, ESRD, MDD who presented for AMS and admitted for hypoxia and syncope workup.  Patient reports that she was at her nursing facility today and was doing her laundry and started to feel weak so she went to the dining room and sat down and per her reports, she passed out. She is unsure how long this happened. Per nursing home report, she was brought to ER for AMS. Patient reports starting yesterday she was given Moxifloxacin and Oxycodone which are new meds and she is unsure why. Denies other complaints. Denies headache, dizziness, fever, chills, cough, SOB, chest pain, abdominal pain, nausea, vomiting, diarrhea, constipation, dysuria, hematuria, hematochezia, melena, difficulty moving arms/legs, speech difficulty, trouble eating, confusion or any other complaints.  In the ED: Slightly hypotensive and temp 95.51F otherwise stable on room air. Labs remarkable for glucose 299, Cr 3.75, Hgb 8.8, WBC 7.9, Ethanol level and UDS WNL, Lactate 2.3, UA without evidence of infection, COVID neg.  CXR: Cardiomegaly. Bronchovascular crowding versus vascular congestion.  CT Head and CTA Chest: Non-acute  EDP requested admission for hypoxia.  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  Syncope/??  Hypoxia, POA Patient presented from nursing facility with generalized weakness and concern that she may have passed out as well as altered mental status.  Patient noted to have recently been given antibiotics due to concern for possible UTI and oxycodone for right wrist pain after a fall.  On presentation to the ED patient noted to be slightly hypotensive with some hypothermia.  Chest x-ray with vascular congestion.  CT angiogram chest negative for PE, mild multichamber cardiomegaly with contrast refluxing into the hepatic veins and IVC consistent with elevated right heart pressures.  Patulous esophagus with small hiatal hernia.  CT had negative for any acute abnormalities.  Patient noted to have a prior history of altered mental status with hypoxia in the past felt likely secondary to volume overload.  Questionable syncope may have been in part medication induced from pain regimen given that nursing facility.  2D echo obtained with severe tricuspid regurgitation and right ventricular dilatation. Patient seen in consultation by cardiology and patient for right heart cath hopefully today. Patient underwent hemodialysis with clinical improvement. Nephrology and cardiology following.  2.  Severe tricuspid valvular regurgitation and right ventricular dilatation Noted on 2D echo.  Questionable etiology.  CT angiogram chest negative for PE.  Patient does have a tobacco history however CT findings does not show significant COPD/emphysema.  Cardiology consulted and recommended right heart cath to define pulmonary pressures hopefully to be done today..  3.  End-stage renal disease On Tuesday Thursday Saturday schedule.  Patient noted to have  missed hemodialysis on 11/29/2019.  Nephrology consulted and patient underwent hemodialysis on 12/03/2019.  Per nephrology.   4.  Well-controlled diabetes mellitus type 2 Hemoglobin A1c 6.5 on 04/28/2019.  Repeat  hemoglobin A1c at 8.8 (12/03/2019).  CBG 184 this morning.  Sliding scale insulin, meal coverage insulin 12 units 3 times daily with meals.  Continue Neurontin.  5.  Gastroesophageal reflux disease Continue PPI.  6.  Hyperlipidemia Statin.   7.  Hypertension Blood pressure noted to be borderline on admission.  BP improved with a BP of 146/64 this morning. Continue home regimen Lopressor. Norvasc on hold. Follow.  8.  Depression Prozac.  9.  Right wrist pain Mechanical fall.  Plain films of the right wrist with lucency at the base of the ulnar styloid which may be acute or chronic.  Evidence of old distal radial/ulnar meta physeal fractures.  Pain management.  10.  Tobacco abuse Tobacco cessation. Does not want a nicotine patch at this time.    DVT prophylaxis: Heparin Code Status: Full Family Communication: Updated patient.  No family at bedside. Disposition:   Status is: Inpatient    Dispo: The patient is from: SNF              Anticipated d/c is to:SNF              Anticipated d/c date is: To be determined.              Patient currently volume overloaded presenting with shortness of breath and hypoxia underwent hemodialysis.  2D echo with severe right tricuspid regurgitation and RV dilatation.  Cardiology consulted for further evaluation recommended right heart cath.  Nephrology following.        Consultants:   Nephrology: Dr. Carolin Sicks 12/03/2019  Cardiology: Dr. Harrington Challenger 12/04/2019  Procedures:   Plain films of the right wrist 12/03/2019  CT angiogram chest 12/02/2019  CT head without contrast 12/02/2019  2D echo 12/03/2019  Antimicrobials:   None   Subjective: Sleeping but arousable. Feels shortness of breath is improving. Denies any chest pain. No further syncopal episodes noted. Still with some wrist pain.  Objective: Vitals:   12/04/19 1917 12/04/19 2026 12/05/19 0337 12/05/19 0743  BP: (!) 146/135 (!) 150/61  (!) 146/64  Pulse:  78  72  Resp:    19    Temp:  97.6 F (36.4 C)  97.9 F (36.6 C)  TempSrc:  Oral    SpO2:    99%  Weight:   51.6 kg   Height:        Intake/Output Summary (Last 24 hours) at 12/05/2019 0853 Last data filed at 12/04/2019 2158 Gross per 24 hour  Intake 3 ml  Output --  Net 3 ml   Filed Weights   12/03/19 2000 12/04/19 0030 12/05/19 0337  Weight: 52.1 kg 50.3 kg 51.6 kg    Examination:  General exam: NAD Respiratory system: Some bibasilar crackles. No wheezing. Speaking in full sentences. No use of accessory muscles of respiration. Cardiovascular system: RRR with 2-3/6 SEM LUSB. No lower extremity edema. Gastrointestinal system: Abdomen is nontender, nondistended, soft, positive bowel sounds. No rebound. No guarding. Central nervous system: Alert and oriented. No focal neurological deficits. Extremities: Symmetric 5 x 5 power.  Left upper extremity AV fistula with thrill/bruit.  Right wrist slightly swollen with some tenderness to palpation. Skin: No rashes, lesions or ulcers Psychiatry: Judgement and insight appear normal. Mood & affect appropriate.     Data Reviewed: I have personally reviewed following  labs and imaging studies  CBC: Recent Labs  Lab 12/02/19 1603 12/02/19 1721 12/03/19 0329 12/04/19 0448 12/05/19 0301  WBC 7.9  --  7.7 9.3 7.8  NEUTROABS 6.2  --   --   --  5.0  HGB 8.8* 8.5* 8.5* 9.0* 9.0*  HCT 28.6* 25.0* 27.1* 28.2* 28.5*  MCV 96.9  --  96.4 93.4 94.4  PLT 216  --  241 268 244    Basic Metabolic Panel: Recent Labs  Lab 12/02/19 1603 12/02/19 1721 12/03/19 0329 12/04/19 0448 12/05/19 0301  NA 135 135 137 137 136  K 5.0 4.7 4.4 3.5 4.5  CL 96*  --  98 98 98  CO2 27  --  24 28 27   GLUCOSE 299*  --  156* 196* 168*  BUN 22*  --  28* 6 27*  CREATININE 3.75*  --  4.31* 1.91* 4.37*  CALCIUM 8.7*  --  8.6* 8.5* 9.2  PHOS  --   --   --  3.3 4.3    GFR: Estimated Creatinine Clearance: 8.7 mL/min (A) (by C-G formula based on SCr of 4.37 mg/dL (H)).  Liver  Function Tests: Recent Labs  Lab 12/02/19 1603 12/04/19 0448 12/05/19 0301  AST 20  --   --   ALT 22  --   --   ALKPHOS 103  --   --   BILITOT 0.5  --   --   PROT 7.5  --   --   ALBUMIN 3.6 3.3* 3.3*    CBG: Recent Labs  Lab 12/04/19 0804 12/04/19 1344 12/04/19 1838 12/04/19 2103 12/05/19 0739  GLUCAP 147* 227* 71 180* 184*     Recent Results (from the past 240 hour(s))  Blood Cultures (routine x 2)     Status: None (Preliminary result)   Collection Time: 12/02/19  3:46 PM   Specimen: BLOOD RIGHT HAND  Result Value Ref Range Status   Specimen Description BLOOD RIGHT HAND  Final   Special Requests   Final    BOTTLES DRAWN AEROBIC ONLY Blood Culture results may not be optimal due to an inadequate volume of blood received in culture bottles   Culture   Final    NO GROWTH 2 DAYS Performed at [facility], [place] [address], [place], [place] [address]    Report Status PENDING  Incomplete  Blood Cultures (routine x 2)     Status: None (Preliminary result)   Collection Time: 12/02/19  4:02 PM   Specimen: BLOOD  Result Value Ref Range Status   Specimen Description BLOOD RIGHT ANTECUBITAL  Final   Special Requests   Final    BOTTLES DRAWN AEROBIC AND ANAEROBIC Blood Culture adequate volume   Culture   Final    NO GROWTH 2 DAYS Performed at [facility], [place] [address], [place], [place] [address]    Report Status PENDING  Incomplete  SARS Coronavirus 2 by RT PCR (hospital order, performed in [facility] hospital lab) Nasopharyngeal Nasopharyngeal Swab     Status: None   Collection Time: 12/02/19  5:00 PM   Specimen: Nasopharyngeal Swab  Result Value Ref Range Status   SARS Coronavirus 2 NEGATIVE NEGATIVE Final    Comment: (NOTE) SARS-CoV-2 target nucleic acids are NOT DETECTED.  The SARS-CoV-2 RNA is generally detectable in upper and lower respiratory specimens during the acute phase of infection. The lowest concentration of SARS-CoV-2 viral copies this  assay can detect is 250 copies / mL. A negative  result does not preclude SARS-CoV-2 infection and should not be used as the sole basis for treatment or other patient management decisions.  A negative result may occur with improper specimen collection / handling, submission of specimen other than nasopharyngeal swab, presence of viral mutation(s) within the areas targeted by this assay, and inadequate number of viral copies (<250 copies / mL). A negative result must be combined with clinical observations, patient history, and epidemiological information.  Fact Sheet for Patients:   StrictlyIdeas.no  Fact Sheet for Healthcare Providers: BankingDealers.co.za  This test is not yet approved or  cleared by the [place] FDA and has been authorized for detection and/or diagnosis of SARS-CoV-2 by FDA under an Emergency Use Authorization (EUA).  This EUA will remain in effect (meaning this test can be used) for the duration of the COVID-19 declaration under Section 564(b)(1) of the Act, 21 U.S.C. section 360bbb-3(b)(1), unless the authorization is terminated or revoked sooner.  Performed at [facility], [place] [address] [place], [place] [address]          Radiology Studies: DG Wrist Complete Right  Result Date: 12/03/2019 CLINICAL DATA:  Fall 1 week ago with right wrist pain. EXAM: RIGHT WRIST - COMPLETE 3+ VIEW COMPARISON:  08/31/2018 FINDINGS: Mild degenerate changes of the radiocarpal joint and radial side of the carpal bones/first carpometacarpal joint. Evidence of patient's old distal radial and ulnar metaphyseal fractures. Subtle lucency at the base of the ulnar styloid which may be acute or chronic. IMPRESSION: Lucency at the base of the ulnar styloid which may be acute or chronic. Evidence of old distal radial/ulnar metaphyseal fractures. Electronically Signed   By: Marin Olp M.D.   On: 12/03/2019 11:49   ECHOCARDIOGRAM  COMPLETE  Result Date: 12/03/2019    ECHOCARDIOGRAM REPORT   Patient Name:   [name] Michael Date of Exam: 12/03/2019 Medical Rec #:  [id number]      Height:       54.0 in Accession #:    [id number]     Weight:       109.1 lb Date of Birth:  Nov 28, 1960      BSA:          1.337 m Patient Age:    54 years       BP:           139/56 mmHg Patient Gender: F              HR:           75 bpm. Exam Location:  Inpatient Procedure: 2D Echo, Cardiac Doppler and Color Doppler Indications:    Syncope 780.2 / R55  History:        Patient has prior history of Echocardiogram examinations, most                 recent 06/10/2018. Stroke; Risk Factors:Hypertension, Diabetes                 and Dyslipidemia. CKD (chronic kidney disease) stage 5, GFR less                 than 15 ml/min.  Sonographer:    Alvino Chapel RCS Referring Phys: [id number] [place]  1. Left ventricular ejection fraction, by estimation, is 55 to 60%. The left ventricle has normal function. The left ventricle has no regional wall motion abnormalities. Left ventricular diastolic parameters were normal.  2. Severe TR and RV dilatation suggest elevated right heart pressures  consider CTA to r/o PE in setting of syncope . Right ventricular systolic function is mildly reduced. The right ventricular size is normal. There is mildly elevated pulmonary artery systolic pressure.  3. Right atrial size was mildly dilated.  4. The mitral valve is normal in structure. Mild to moderate mitral valve regurgitation. No evidence of mitral stenosis.  5. 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  6. The aortic valve is normal in structure. Aortic valve regurgitation is not visualized. No aortic stenosis is present.  7. The inferior vena cava is normal in size with <50% respiratory variability, suggesting right atrial pressure of 8 mmHg. FINDINGS  Left Ventricle: Left ventricular ejection fraction, by estimation, is 55 to 60%. The left ventricle has normal function. The  left ventricle has no regional wall motion abnormalities. The left ventricular internal cavity size was normal in size. There is  no left ventricular hypertrophy. Left ventricular diastolic parameters were normal. Right Ventricle: Severe TR and RV dilatation suggest elevated right heart pressures consider CTA to r/o PE in setting of syncope. The right ventricular size is normal. Right vetricular wall thickness was not assessed. Right ventricular systolic function is mildly reduced. There is mildly elevated pulmonary artery systolic pressure. The tricuspid regurgitant velocity is 3.00 m/s, and with an assumed right atrial pressure of 3 mmHg, the estimated right ventricular systolic pressure is 83.4 mmHg. Left Atrium: Left atrial size was normal in size. Right Atrium: Right atrial size was mildly dilated. Pericardium: There is no evidence of pericardial effusion. Mitral Valve: The mitral valve is normal in structure. There is mild thickening of the mitral valve leaflet(s). There is mild calcification of the mitral valve leaflet(s). Normal mobility of the mitral valve leaflets. Mild to moderate mitral valve regurgitation. No evidence of mitral valve stenosis. Tricuspid Valve: The tricuspid valve is normal in structure. 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 No evidence of tricuspid stenosis. Aortic Valve: The aortic valve is normal in structure. Aortic valve regurgitation is not visualized. No aortic stenosis is present. Pulmonic Valve: The pulmonic valve was normal in structure. Pulmonic valve regurgitation is not visualized. No evidence of pulmonic stenosis. Aorta: The aortic root is normal in size and structure. Venous: The inferior vena cava is normal in size with less than 50% respiratory variability, suggesting right atrial pressure of 8 mmHg. IAS/Shunts: No atrial level shunt detected by color flow Doppler.  LEFT VENTRICLE PLAX 2D LVIDd:         4.50 cm  Diastology LVIDs:         2.70 cm  LV e' lateral:   8.81  cm/s LV PW:         1.00 cm  LV E/e' lateral: 15.3 LV IVS:        1.00 cm  LV e' medial:    7.72 cm/s LVOT diam:     1.50 cm  LV E/e' medial:  17.5 LV SV:         48 LV SV Index:   36 LVOT Area:     1.77 cm  RIGHT VENTRICLE RV S prime:     12.60 cm/s TAPSE (M-mode): 2.2 cm LEFT ATRIUM             Index       RIGHT ATRIUM           Index LA diam:        3.60 cm 2.69 cm/m  RA Area:     16.70 cm LA Vol (A2C):   55.6 ml  41.58 ml/m RA Volume:   50.10 ml  37.47 ml/m LA Vol (A4C):   45.8 ml 34.25 ml/m LA Biplane Vol: 50.9 ml 38.07 ml/m  AORTIC VALVE LVOT Vmax:   110.00 cm/s LVOT Vmean:  72.000 cm/s LVOT VTI:    0.269 m  AORTA Ao Root diam: 2.80 cm MITRAL VALVE                 TRICUSPID VALVE MV Area (PHT): 4.31 cm      TR Peak grad:   36.0 mmHg MV Decel Time: 176 msec      TR Vmax:        300.00 cm/s MR Peak grad:    140.2 mmHg MR Mean grad:    100.0 mmHg  SHUNTS MR Vmax:         592.00 cm/s Systemic VTI:  0.27 m MR Vmean:        477.0 cm/s  Systemic Diam: 1.50 cm MR PISA:         1.01 cm MR PISA Eff ROA: 5 mm MR PISA Radius:  0.40 cm MV E velocity: 135.00 cm/s MV A velocity: 113.00 cm/s MV E/A ratio:  1.19 Jenkins Rouge MD Electronically signed by Jenkins Rouge MD Signature Date/Time: 12/03/2019/4:19:51 PM    Final         Scheduled Meds:  aspirin  81 mg Oral Daily   atorvastatin  40 mg Oral QHS   Chlorhexidine Gluconate Cloth  6 each Topical Daily   Chlorhexidine Gluconate Cloth  6 each Topical Q0600   [START ON 12/06/2019] darbepoetin (ARANESP) injection - DIALYSIS  40 mcg Intravenous Q Tue-HD   doxercalciferol  6 mcg Intravenous Q T,Th,Sa-HD   FLUoxetine  10 mg Oral Daily   gabapentin  100 mg Oral BID   heparin  5,000 Units Subcutaneous Q8H   insulin aspart  0-5 Units Subcutaneous QHS   insulin aspart  0-9 Units Subcutaneous TID WC   insulin aspart  12 Units Subcutaneous TID WC   metoprolol tartrate  100 mg Oral BID   pantoprazole  40 mg Oral Daily   polyethylene glycol  17 g  Oral Daily   sevelamer carbonate  800 mg Oral TID WC   sodium chloride flush  3 mL Intravenous Q12H   Continuous Infusions:  sodium chloride     sodium chloride 10 mL/hr at 12/05/19 0642     LOS: 2 days    Time spent: 35 minutes    Irine Seal, MD [facility]   To contact the attending provider between 7A-7P or the covering provider during after hours 7P-7A, please log into the web site [email or web address] and access using universal [place] password for that web site. If you do not have the password, please call the hospital operator.  12/05/2019, 8:53 AM

## 2019-12-05 NOTE — Progress Notes (Signed)
[facility] Progress Note  Subjective: seen in room, no c/o today  Vitals:   12/05/19 1122 12/05/19 1127 12/05/19 1132 12/05/19 1137  BP: (!) 173/76 (!) 168/83 (!) 162/76 (!) 181/81  Pulse: 72 71 72 75  Resp: 19 (!) 21 11 (!) 40  Temp:      TempSrc:      SpO2: 100% 95% 90% 99%  Weight:      Height:        Exam:   alert, nad   no jvd  Chest dec'd at bases, no rales or wheezing  Cor reg no RG  Abd soft ntnd no ascites   Ext no LE edema   Alert, NF, ox3  [name] AVF +bruit     OP HD: TTS [place]  3h 72min  2/2.25 bath  46.5kg leaving above 48-49kg recently 350/800  Hep 2000  AVF L  - hect 6 ug  - mircera 60 ug q2 wks, last 5/25, hb 8.7 on 6/10   CTA - neg for PE, +multichamber CM w/ contrast reflux to hepatic veins cw/ ^R heart pressures   ECHO - severe TR and RV dilatation, mild pulm htn  Assessment/ Plan: 1. Syncope/AMS: BP slightly low on admission with hypoxia, CXR with vascular congestion. Alert and oriented this AM. Also recently started moxifloxacin and oxycodone at SNF, which may have been for UTI and wrist pain respectively per pt report. UA here not sig abnormal.  ECHO showed severe TR and RV dilatation, cardiology taking for R heart cath today.  2. ESRD: TTS schedule, missed HD 6/8. Next HD tomorrow/ Tuesday.  3. Hypertension/volume: BP low on admission, now high/normal.    4. Anemia: Hgb 8.5 > 9.0 today. Given ESA yesterday. 5. Metabolic bone disease: calcium controlled, phos at goal. Continue hectorol and renvela.  6. Nutrition: Alb 3.3. Renal diet with fluid restrictions.     [name] Michael 12/05/2019, 2:13 PM   Recent Labs  Lab 12/04/19 0448 12/05/19 0301  K 3.5 4.5  BUN 6 27*  CREATININE 1.91* 4.37*  CALCIUM 8.5* 9.2  PHOS 3.3 4.3  HGB 9.0* 9.0*   Inpatient medications: . aspirin  81 mg Oral Daily  . atorvastatin  40 mg Oral QHS  . Chlorhexidine Gluconate Cloth  6 each Topical Daily  . Chlorhexidine Gluconate Cloth  6 each  Topical Q0600  . [START ON 12/06/2019] darbepoetin (ARANESP) injection - DIALYSIS  40 mcg Intravenous Q Tue-HD  . doxercalciferol  6 mcg Intravenous Q T,Th,Sa-HD  . FLUoxetine  10 mg Oral Daily  . gabapentin  100 mg Oral BID  . heparin  5,000 Units Subcutaneous Q8H  . insulin aspart  0-5 Units Subcutaneous QHS  . insulin aspart  0-9 Units Subcutaneous TID WC  . insulin aspart  12 Units Subcutaneous TID WC  . metoprolol tartrate  100 mg Oral BID  . pantoprazole  40 mg Oral Daily  . polyethylene glycol  17 g Oral Daily  . sevelamer carbonate  800 mg Oral TID WC  . sodium chloride flush  3 mL Intravenous Q12H  . sodium chloride flush  3 mL Intravenous Q12H   . sodium chloride     sodium chloride, acetaminophen, hydrALAZINE, labetalol, senna-docusate, sodium chloride flush

## 2019-12-05 NOTE — Progress Notes (Signed)
MRSA swab obtained and sent to the lab.

## 2019-12-06 ENCOUNTER — Other Ambulatory Visit: Payer: Self-pay | Admitting: Physician Assistant

## 2019-12-06 DIAGNOSIS — N186 End stage renal disease: Secondary | ICD-10-CM

## 2019-12-06 DIAGNOSIS — R0902 Hypoxemia: Secondary | ICD-10-CM

## 2019-12-06 DIAGNOSIS — R55 Syncope and collapse: Secondary | ICD-10-CM

## 2019-12-06 LAB — GLUCOSE, CAPILLARY
Glucose-Capillary: 173 mg/dL — ABNORMAL HIGH (ref 70–99)
Glucose-Capillary: 95 mg/dL (ref 70–99)
Glucose-Capillary: 198 mg/dL — ABNORMAL HIGH (ref 70–99)

## 2019-12-06 LAB — RENAL FUNCTION PANEL
Albumin: 3.4 g/dL — ABNORMAL LOW (ref 3.5–5.0)
Anion gap: 14 (ref 5–15)
BUN: 48 mg/dL — ABNORMAL HIGH (ref 6–20)
CO2: 23 mmol/L (ref 22–32)
Calcium: 9.4 mg/dL (ref 8.9–10.3)
Chloride: 101 mmol/L (ref 98–111)
Creatinine, Ser: 5.16 mg/dL — ABNORMAL HIGH (ref 0.44–1.00)
GFR calc Af Amer: 10 mL/min — ABNORMAL LOW (ref >60)
GFR calc non Af Amer: 8 mL/min — ABNORMAL LOW (ref >60)
Glucose, Bld: 165 mg/dL — ABNORMAL HIGH (ref 70–99)
Phosphorus: 5.4 mg/dL — ABNORMAL HIGH (ref 2.5–4.6)
Potassium: 4.9 mmol/L (ref 3.5–5.1)
Sodium: 138 mmol/L (ref 135–145)

## 2019-12-06 LAB — CBC
HCT: 30.3 % — ABNORMAL LOW (ref 36.0–46.0)
Hemoglobin: 9.7 g/dL — ABNORMAL LOW (ref 12.0–15.0)
MCH: 30.3 pg (ref 26.0–34.0)
MCHC: 32 g/dL (ref 30.0–36.0)
MCV: 94.7 fL (ref 80.0–100.0)
Platelets: 319 10*3/uL (ref 150–400)
RBC: 3.2 MIL/uL — ABNORMAL LOW (ref 3.87–5.11)
RDW: 16 % — ABNORMAL HIGH (ref 11.5–15.5)
WBC: 8 10*3/uL (ref 4.0–10.5)
nRBC: 0 % (ref 0.0–0.2)

## 2019-12-06 LAB — SARS CORONAVIRUS 2 BY RT PCR (HOSPITAL ORDER, PERFORMED IN ~~LOC~~ HOSPITAL LAB): SARS Coronavirus 2: NEGATIVE

## 2019-12-06 MED ORDER — DOXERCALCIFEROL 4 MCG/2ML IV SOLN
INTRAVENOUS | Status: AC
  Filled 2019-12-06: qty 4

## 2019-12-06 MED ORDER — BACITRACIN ZINC 500 UNIT/GM EX OINT: TOPICAL_OINTMENT | Freq: Two times a day (BID) | CUTANEOUS | 0 refills | Status: AC

## 2019-12-06 MED ORDER — DARBEPOETIN ALFA 40 MCG/0.4ML IJ SOSY
PREFILLED_SYRINGE | INTRAMUSCULAR | Status: AC
  Filled 2019-12-06: qty 0.4

## 2019-12-06 MED FILL — Heparin Sod (Porcine)-NaCl IV Soln 1000 Unit/500ML-0.9%: INTRAVENOUS | Qty: 500 | Status: AC

## 2019-12-06 NOTE — Progress Notes (Signed)
Progress Note  Patient Name: [name] Michael Date of Encounter: 12/06/2019  [facility] Cardiologist: Dorris Carnes, MD   Subjective   Feeling well. No chest pain, sob or palpitations.   Inpatient Medications    Scheduled Meds: . aspirin  81 mg Oral Daily  . atorvastatin  40 mg Oral QHS  . bacitracin   Topical BID  . Chlorhexidine Gluconate Cloth  6 each Topical Daily  . Chlorhexidine Gluconate Cloth  6 each Topical Q0600  . Chlorhexidine Gluconate Cloth  6 each Topical Q0600  . darbepoetin (ARANESP) injection - DIALYSIS  40 mcg Intravenous Q Tue-HD  . doxercalciferol  6 mcg Intravenous Q T,Th,Sa-HD  . FLUoxetine  10 mg Oral Daily  . gabapentin  100 mg Oral BID  . heparin  5,000 Units Subcutaneous Q8H  . insulin aspart  0-5 Units Subcutaneous QHS  . insulin aspart  0-9 Units Subcutaneous TID WC  . insulin aspart  12 Units Subcutaneous TID WC  . metoprolol tartrate  100 mg Oral BID  . pantoprazole  40 mg Oral Daily  . polyethylene glycol  17 g Oral Daily  . sevelamer carbonate  800 mg Oral TID WC  . sodium chloride flush  3 mL Intravenous Q12H  . sodium chloride flush  3 mL Intravenous Q12H   Continuous Infusions: . sodium chloride     PRN Meds: sodium chloride, acetaminophen, senna-docusate, sodium chloride flush   Vital Signs    Vitals:   12/05/19 1137 12/05/19 1554 12/05/19 2222 12/06/19 0730  BP: (!) 181/81 (!) 139/48 (!) 140/53 (!) 186/59  Pulse: 75 69 72 72  Resp: (!) 40 19  17  Temp:  98.4 F (36.9 C) 98.3 F (36.8 C) 98.6 F (37 C)  TempSrc:      SpO2: 99% 91% 92% 94%  Weight:      Height:       No intake or output data in the 24 hours ending 12/06/19 0953 Last 3 Weights 12/05/2019 12/04/2019 12/03/2019  Weight (lbs) 113 lb 12.1 oz 110 lb 14.3 oz 114 lb 13.8 oz  Weight (kg) 51.6 kg 50.3 kg 52.1 kg      Telemetry    NSR - Personally Reviewed  ECG    No new tracing   Physical Exam   GEN: No acute distress.   Neck: No JVD Cardiac: RRR, +  murmur, rubs, or gallops.  Respiratory: Clear to auscultation bilaterally. GI: Soft, nontender, non-distended  MS: No edema; No deformity. Neuro:  Nonfocal  Psych: Normal affect   Labs    High Sensitivity Troponin:   Recent Labs  Lab 12/03/19 0329  TROPONINIHS 8      Chemistry Recent Labs  Lab 12/02/19 1603 12/02/19 1721 12/04/19 0448 12/04/19 0448 12/05/19 0301 12/05/19 1132 12/06/19 0352  NA 135   < > 137   < > 136 140 138  K 5.0   < > 3.5   < > 4.5 4.7 4.9  CL 96*   < > 98  --  98  --  101  CO2 27   < > 28  --  27  --  23  GLUCOSE 299*   < > 196*  --  168*  --  165*  BUN 22*   < > 6  --  27*  --  48*  CREATININE 3.75*   < > 1.91*  --  4.37*  --  5.16*  CALCIUM 8.7*   < > 8.5*  --  9.2  --  9.4  PROT 7.5  --   --   --   --   --   --   ALBUMIN 3.6   < > 3.3*  --  3.3*  --  3.4*  AST 20  --   --   --   --   --   --   ALT 22  --   --   --   --   --   --   ALKPHOS 103  --   --   --   --   --   --   BILITOT 0.5  --   --   --   --   --   --   GFRNONAA 12*   < > 28*  --  10*  --  8*  GFRAA 14*   < > 33*  --  12*  --  10*  ANIONGAP 12   < > 11  --  11  --  14   < > = values in this interval not displayed.     Hematology Recent Labs  Lab 12/04/19 0448 12/04/19 0448 12/05/19 0301 12/05/19 1132 12/06/19 0352  WBC 9.3  --  7.8  --  8.0  RBC 3.02*  --  3.02*  --  3.20*  HGB 9.0*   < > 9.0* 9.2* 9.7*  HCT 28.2*   < > 28.5* 27.0* 30.3*  MCV 93.4  --  94.4  --  94.7  MCH 29.8  --  29.8  --  30.3  MCHC 31.9  --  31.6  --  32.0  RDW 15.9*  --  16.0*  --  16.0*  PLT 268  --  290  --  319   < > = values in this interval not displayed.    BNP Recent Labs  Lab 12/03/19 0329  BNP 1,275.7*     Radiology    CARDIAC CATHETERIZATION  Result Date: 12/05/2019 Conclusions: 1. Mildly elevated left heart filling pressure. 2. Mildly to moderately elevated right heart and pulmonary artery pressures. 3. Normal cardiac output/index. Recommendations: 1. Per primary and  cardiology consult teams. Nelva Bush, MD [facility]    Cardiac Studies   Echo 12/03/19 1. Left ventricular ejection fraction, by estimation, is 55 to 60%. The  left ventricle has normal function. The left ventricle has no regional  wall motion abnormalities. Left ventricular diastolic parameters were  normal.  2. Severe TR and RV dilatation suggest elevated right heart pressures  consider CTA to r/o PE in setting of syncope . Right ventricular systolic  function is mildly reduced. The right ventricular size is normal. There is  mildly elevated pulmonary artery  systolic pressure.  3. Right atrial size was mildly dilated.  4. The mitral valve is normal in structure. Mild to moderate mitral valve  regurgitation. No evidence of mitral stenosis.  5. 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  6. The aortic valve is normal in structure. Aortic valve regurgitation is  not visualized. No aortic stenosis is present.  7. The inferior vena cava is normal in size with <50% respiratory  variability, suggesting right atrial pressure of 8 mmHg.    RIGHT HEART CATH 12/05/19  Conclusion  Conclusions: 1. Mildly elevated left heart filling pressure. 2. Mildly to moderately elevated right heart and pulmonary artery pressures. 3. Normal cardiac output/index.  Recommendations: 1. Per primary and cardiology consult teams. Right Heart Pressures RA (mean): 12 mmHg RV (S/EDP): 50/13 mmHg PA (  S/D, mean): 50/25 (33) mmHg PCWP (mean): 20 mmHg  Ao sat: 94% PA sat: 65%  Fick CO: 5.3 L/min Fick CI: 3.9 L/min/m^2  Thermodilution CO: 4.8 L/min Thermodilution CI: 3.5 L/min/m^2  PVR: 2.5 (Fick) and 2.7 (thermodilution) Wood units     Patient Profile     59 y.o. female with a history of HTN, HL, stroke, TIA, DMII, ongoing tob abuse, ESRD on HD, depression, and GERD, who is being seen for the evaluation of syncope and right heart failure w/ severe TR at the request of Dr.  Grandville Silos.  Assessment & Plan    1. AMS/Syncope - Pt says that she felt warm and then very sleepy prior to potentially losing consciousness.  No lightheadedness. She walked from the laundry room @ her facility to the cafeteria and remembers placing her head on a table b/c she was so tired.  She was found to be hypoxic.  No CVA/PE.  - ? Due to narcotic use - No arrhythmias on tele while admitted - Can arrange outpatient monitor  2. Severe TR - Echo showed severe TR and RV dilatation suggest elevated right heart pressures  Consider. Normal LVEF. No WM abnormality. RHC showed normal cardiac output. Mildly elevated left heart filling pressure. Mildly to moderately elevated right heart and pulmonary artery pressures. - Recommended increase volume removal with dialysis. She still makes urine.   3. ESRD on HD (TTS)  4. HTN - BP running high - On Lopressor 100mg  BID - Consider adding another regiment on non HD days if BP runs low after dialysis   5. DM - A1c 8.8 - Per primary  6. Tobacco smoking - Recommended cessation   7. HLD - No results found for requested labs within last 8760 hours.  - Continue statin    For questions or updates, please contact [place] Please consult [email or web address] for contact info under        SignedLeanor Kail, PA  12/06/2019, 9:53 AM

## 2019-12-06 NOTE — Progress Notes (Signed)
[name] Michael Progress Note  Subjective: seen in room, no c/o today  Vitals:   12/06/19 1240 12/06/19 1300 12/06/19 1330 12/06/19 1400  BP: (!) 120/55 139/66 (!) 130/58 118/73  Pulse:  64 64 69  Resp:  16 16 16   Temp:      TempSrc:      SpO2:      Weight:      Height:        Exam:   alert, nad   no jvd  Chest dec'd at bases, no rales or wheezing  Cor reg no RG  Abd soft ntnd no ascites   Ext no LE edema   Alert, NF, ox3  [name] AVF +bruit     OP HD: TTS [place]  3h 91min  2/2.25 bath  46.5kg leaving above 48-49kg recently 350/800  Hep 2000  AVF L  - hect 6 ug  - mircera 60 ug q2 wks, last 5/25, hb 8.7 on 6/10   CTA - neg for PE, +multichamber CM w/ contrast reflux to hepatic veins cw/ ^R heart pressures   ECHO - severe TR and RV dilatation, mild pulm htn  Assessment/ Plan: 1. Syncope/AMS: MS back to baseline. Recently started moxifloxacin and oxycodone at SNF, which may have been for UTI and wrist pain respectively per pt report. UA here not sig abnormal.  ECHO showed severe TR and RV dilatation > cardiology did R heart cath yest 6/14 which did not show any cause for syncope.  2.  ESRD: TTS schedule, missed HD 6/8. HD today, no sig UF.  3. Hypertension/volume: BP low on admission, now high/normal.    4. Anemia: Hgb 8.5 > 9.0 today. Given ESA yesterday. 5. Metabolic bone disease: calcium controlled, phos at goal. Continue hectorol and renvela.  6. Nutrition: Alb 3.3. Renal diet with fluid restrictions.     [name] Michael 12/06/2019, 2:25 PM   Recent Labs  Lab 12/05/19 0301 12/05/19 0301 12/05/19 1132 12/06/19 0352  K 4.5   < > 4.7 4.9  BUN 27*  --   --  48*  CREATININE 4.37*  --   --  5.16*  CALCIUM 9.2  --   --  9.4  PHOS 4.3  --   --  5.4*  HGB 9.0*   < > 9.2* 9.7*   < > = values in this interval not displayed.   Inpatient medications: . aspirin  81 mg Oral Daily  . atorvastatin  40 mg Oral QHS  . bacitracin   Topical BID  .  Chlorhexidine Gluconate Cloth  6 each Topical Daily  . Chlorhexidine Gluconate Cloth  6 each Topical Q0600  . Chlorhexidine Gluconate Cloth  6 each Topical Q0600  . Darbepoetin Alfa      . darbepoetin (ARANESP) injection - DIALYSIS  40 mcg Intravenous Q Tue-HD  . doxercalciferol      . doxercalciferol  6 mcg Intravenous Q T,Th,Sa-HD  . FLUoxetine  10 mg Oral Daily  . gabapentin  100 mg Oral BID  . heparin  5,000 Units Subcutaneous Q8H  . insulin aspart  0-5 Units Subcutaneous QHS  . insulin aspart  0-9 Units Subcutaneous TID WC  . insulin aspart  12 Units Subcutaneous TID WC  . metoprolol tartrate  100 mg Oral BID  . pantoprazole  40 mg Oral Daily  . polyethylene glycol  17 g Oral Daily  . sevelamer carbonate  800 mg Oral TID WC  . sodium chloride flush  3 mL Intravenous Q12H  .  sodium chloride flush  3 mL Intravenous Q12H   . sodium chloride     sodium chloride, acetaminophen, senna-docusate, sodium chloride flush

## 2019-12-06 NOTE — Discharge Summary (Signed)
Physician Discharge Summary  [name] Michael CBJ:[id number] DOB: [birth year] DOA: 12/02/2019  PCP: Charlott Rakes, MD  Admit date: 12/02/2019 Discharge date: 12/06/2019  Time spent: 60 minutes  Recommendations for Outpatient Follow-up:  1. Follow-up with MD at skilled nursing facility.  Patient will be discharged and be set up with outpatient monitor by cardiology. 2. Follow-up with Dr. Harrington Challenger, cardiology for follow-up on severe tricuspid valve regurgitation. 3. Follow-up in hemodialysis unit on regular scheduled day of 12/08/2019. 4. Follow-up with Charlott Rakes, MD in 2 weeks.   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)   Hyperlipidemia   Essential hypertension   GERD   Depression   Poor social situation   CKD (chronic kidney disease) stage 5, GFR less than 15 ml/min (HCC)   Wrist pain, acute, right   ESRD (end stage renal disease) (HCC)   GERD (gastroesophageal reflux disease)   Hypertension   Type II diabetes mellitus ([place])   Weakness   Acute on chronic right-sided heart failure ([place])   Hypoxia   Discharge Condition: Stable and improved  Diet recommendation: Heart healthy  Filed Weights   12/04/19 0030 12/05/19 0337 12/06/19 1033  Weight: 50.3 kg 51.6 kg 48.7 kg    History of present illness:  HPI per Dr. [name] is a 59 y.o. female with PMH of HTN, HLD, DM, ESRD, MDD who presented for AMS and admitted for hypoxia and syncope workup.  Patient reported that she was at her nursing facility today and was doing her laundry and started to feel weak so she went to the dining room and sat down and per her reports, she passed out. She was unsure how long this happened. Per nursing home report, she was brought to ER for AMS. Patient reported starting yesterday she was given Moxifloxacin and Oxycodone which are new meds and she is  unsure why. Denied other complaints. Denied headache, dizziness, fever, chills, cough, SOB, chest pain, abdominal pain, nausea, vomiting, diarrhea, constipation, dysuria, hematuria, hematochezia, melena, difficulty moving arms/legs, speech difficulty, trouble eating, confusion or any other complaints.  In the ED: Slightly hypotensive and temp 95.39F otherwise stable on room air. Labs remarkable for glucose 299, Cr 3.75, Hgb 8.8, WBC 7.9, Ethanol level and UDS WNL, Lactate 2.3, UA without evidence of infection, COVID neg.  CXR: Cardiomegaly. Bronchovascular crowding versus vascular congestion.  CT Head and CTA Chest: Non-acute  EDP requested admission for hypoxia.  Hospital Course:  1 acute metabolic encephalopathy/??  Syncope/??  Hypoxia, POA Patient presented from nursing facility with generalized weakness and concern that she may have passed out as well as altered mental status.  Patient noted to have recently been given antibiotics due to concern for possible UTI and oxycodone for right wrist pain after a fall.  On presentation to the ED patient noted to be slightly hypotensive with some hypothermia.  Chest x-ray with vascular congestion.  CT angiogram chest negative for PE, mild multichamber cardiomegaly with contrast refluxing into the hepatic veins and IVC consistent with elevated right heart pressures.  Patulous esophagus with small hiatal hernia.  CT head negative for any acute abnormalities.  Patient noted to have a prior history of altered mental status with hypoxia in the past felt likely secondary to volume overload.  Questionable syncope may have been in part medication induced from pain regimen given that nursing facility.  2D echo obtained  with severe tricuspid regurgitation and right ventricular dilatation. Patient seen in consultation by cardiology and patient underwent right heart cardiac catheterization 12/05/2019 that showed  mild elevated left heart filling pressure, mildly to  moderately elevated right heart and pulmonary artery pressures, normal cardiac output/index.  Cardiology recommended increase volume removal with dialysis.  No further inpatient cardiac work-up.  Outpatient follow-up with cardiology.  No arrhythmias noted on telemetry.  Cardiology recommended outpatient monitor which will be arranged.  Patient had no further syncopal episodes during the hospitalization, hypoxia improved and resolved.  Outpatient follow-up.  2.  Severe tricuspid valvular regurgitation and right ventricular dilatation Noted on 2D echo.  Questionable etiology.  CT angiogram chest negative for PE.  Patient does have a tobacco history however CT findings does not show significant COPD/emphysema.  Cardiology consulted and recommended right heart cath to define pulmonary pressures  12/05/2019 which showed mild elevated left heart filling pressure, mildly to moderately elevated right heart and pulmonary artery pressures, normal cardiac output/index.  Cardiology recommended increase volume removal with dialysis.  No further inpatient cardiac work-up.  Outpatient follow-up with cardiology.  3.  End-stage renal disease On Tuesday Thursday Saturday schedule.  Patient noted to have missed hemodialysis on 11/29/2019.  Nephrology consulted and patient underwent hemodialysis on 12/03/2019, 12/06/2019.  Follow-up in outpatient hemodialysis center as scheduled on hemodialysis day.  4.  Well-controlled diabetes mellitus type 2 Hemoglobin A1c 6.5 on 04/28/2019.  Repeat hemoglobin A1c at 8.8 (12/03/2019).  Patient maintained on home regimen new coverage insulin as well as sliding scale insulin and home regimen Neurontin.  Outpatient follow-up.  5.  Gastroesophageal reflux disease Maintained on PPI.    6.  Hyperlipidemia Maintained on statin.   7.  Hypertension Blood pressure noted to be borderline on admission.  BP improved .  Patient home regimen of Norvasc was discontinued.  Patient maintained on  Lopressor during the hospitalization.  Outpatient follow-up.  If patient's blood pressure remains elevated in the outpatient setting may consider resumption of patient's Norvasc.  8.  Depression Patient maintained on home regimen of Prozac.  9.  Right wrist pain Mechanical fall.  Plain films of the right wrist with lucency at the base of the ulnar styloid which may be acute or chronic.  Evidence of old distal radial/ulnar meta physeal fractures.  Pain management with Tylenol.  Oxycodone discontinued due to concerns that may have contributed to patient's syncopal episode.  Outpatient follow-up..  10.  Tobacco abuse Tobacco cessation.  Patient refused nicotine patch during the hospitalization.  Outpatient follow-up.       Procedures:  Plain films of the right wrist 12/03/2019  CT angiogram chest 12/02/2019  CT head without contrast 12/02/2019  2D echo 12/03/2019  Right heart cardiac catheterization per Dr. end 12/05/2019  Consultations:  Nephrology: Dr. Carolin Sicks 12/03/2019  Cardiology: Dr. Harrington Challenger 12/04/2019  Discharge Exam: Vitals:   12/06/19 1425 12/06/19 1435  BP: (!) 142/68 (!) 152/70  Pulse: 69 82  Resp: 16 16  Temp:    SpO2:      General: NAD Cardiovascular: RRR Respiratory: CTAB  Discharge Instructions   Discharge Instructions    Diet - low sodium heart healthy   Complete by: As directed    Increase activity slowly   Complete by: As directed      Allergies as of 12/06/2019      Reactions   Hydrocodone Nausea And Vomiting   Listed in Epic, but not on the [facility]      Medication List  STOP taking these medications   amLODipine 10 MG tablet Commonly known as: NORVASC   OXYCODONE HCL PO     TAKE these medications   acetaminophen 325 MG tablet Commonly known as: TYLENOL Take 650 mg by mouth every 4 (four) hours as needed for mild pain.   aspirin 81 MG chewable tablet Chew 81 mg by mouth daily.   atorvastatin 40 MG tablet Commonly known as:  LIPITOR Take 40 mg by mouth at bedtime.   bacitracin ointment Apply topically 2 (two) times daily for 5 days.   Banophen 25 mg capsule Generic drug: diphenhydrAMINE Take 25 mg by mouth every 4 (four) hours as needed for itching or allergies.   CertaVite Senior/Antioxidant Tabs Take 1 tablet by mouth daily.   famotidine 10 MG tablet Commonly known as: PEPCID Take 10 mg by mouth daily.   FLUoxetine 10 MG capsule Commonly known as: PROZAC Take 10 mg by mouth daily.   gabapentin 100 MG capsule Commonly known as: NEURONTIN Take 1 capsule (100 mg total) by mouth 2 (two) times daily.   GaviLAX 17 GM/SCOOP powder Generic drug: polyethylene glycol powder Take 17 g by mouth See admin instructions. Mix 17 grams of powder into 8 ounces of fluid, stir, and drink (by mouth) once a day   insulin lispro 100 UNIT/ML injection Commonly known as: HUMALOG Inject 12 Units into the skin 3 (three) times daily with meals. Hold for BGL <150 or if patient is not eating.   loratadine 10 MG tablet Commonly known as: CLARITIN Take 10 mg by mouth daily.   metoprolol tartrate 100 MG tablet Commonly known as: LOPRESSOR Take 100 mg by mouth 2 (two) times daily.   omeprazole 40 MG capsule Commonly known as: PRILOSEC Take 40 mg by mouth daily.   SACCHAROMYCES BOULARDII PO Take 1 capsule by mouth 2 (two) times daily.   senna-docusate 8.6-50 MG tablet Commonly known as: Senokot-S Take 1 tablet by mouth at bedtime as needed for mild constipation.   sevelamer carbonate 800 MG tablet Commonly known as: RENVELA Take 800 mg by mouth 3 (three) times daily with meals.   sodium bicarbonate 650 MG tablet Take 1,300 mg by mouth 2 (two) times daily.   Vitamin D (Ergocalciferol) 1.25 MG (50000 UNIT) Caps capsule Commonly known as: DRISDOL Take 1 capsule (50,000 Units total) by mouth every 7 (seven) days. What changed: when to take this      Allergies  Allergen Reactions  . Hydrocodone Nausea And  Vomiting    Listed in Epic, but not on the [facility]    Contact information for follow-up providers    Fay Records, MD Follow up.   Specialty: Cardiology Why: office will call with date and time of appointment and monitor set up  Contact information: [place] [phone]        Charlott Rakes, MD. Schedule an appointment as soon as possible for a visit in 2 week(s).   Specialty: Family Medicine Contact information: [place] [address] [phone]        MD AT SNF Follow up.        HD Follow up on 12/08/2019.   Why: Follow-up at hemodialysis on scheduled hemodialysis today           Contact information for after-discharge care    [facility] .   Service: Skilled Nursing Contact information: [place]  [phone]                   The results of significant diagnostics from this hospitalization (including imaging, microbiology, ancillary and laboratory) are listed below for reference.    Significant Diagnostic Studies: DG Wrist Complete Right  Result Date: 12/03/2019 CLINICAL DATA:  Fall 1 week ago with right wrist pain. EXAM: RIGHT WRIST - COMPLETE 3+ VIEW COMPARISON:  08/31/2018 FINDINGS: Mild degenerate changes of the radiocarpal joint and radial side of the carpal bones/first carpometacarpal joint. Evidence of patient's old distal radial and ulnar metaphyseal fractures. Subtle lucency at the base of the ulnar styloid which may be acute or chronic. IMPRESSION: Lucency at the base of the ulnar styloid which may be acute or chronic. Evidence of old distal radial/ulnar metaphyseal fractures. Electronically Signed   By: Marin Olp M.D.   On: 12/03/2019 11:49   CT HEAD WO CONTRAST  Result Date: 12/02/2019 CLINICAL DATA:  Altered mental status. EXAM: CT HEAD WITHOUT CONTRAST TECHNIQUE: Contiguous axial images were obtained from  the base of the skull through the vertex without intravenous contrast. COMPARISON:  CT head dated April 28, 2019. FINDINGS: Brain: No evidence of acute infarction, hemorrhage, hydrocephalus, extra-axial collection or mass lesion/mass effect. Small bilateral basal ganglia chronic lacunar infarcts again noted. Stable mild atrophy and chronic microvascular ischemic changes. Vascular: Calcified atherosclerosis at the skullbase. No hyperdense vessel. Skull: Normal. Negative for fracture or focal lesion. Sinuses/Orbits: No acute finding. Chronic small vitreous hemorrhage in the posterior left globe. Other: None. IMPRESSION: 1. No acute intracranial abnormality. Electronically Signed   By: Titus Dubin M.D.   On: 12/02/2019 18:56   CT Angio Chest PE W and/or Wo Contrast  Result Date: 12/02/2019 CLINICAL DATA:  Shortness of breath.  Altered mental status. EXAM: CT ANGIOGRAPHY CHEST WITH CONTRAST TECHNIQUE: Multidetector CT imaging of the chest was performed using the standard protocol during bolus administration of intravenous contrast. Multiplanar CT image reconstructions and MIPs were obtained to evaluate the vascular anatomy. CONTRAST:  48mL OMNIPAQUE IOHEXOL 350 MG/ML SOLN COMPARISON:  Radiograph earlier this day. FINDINGS: Cardiovascular: There are no filling defects within the pulmonary arteries to suggest pulmonary embolus. Aortic atherosclerosis. No aneurysm. Common origin of the brachiocephalic and left common carotid artery, variant arch anatomy. Mild multi chamber cardiomegaly. There is contrast refluxing into the hepatic veins and IVC. No pericardial effusion. Mediastinum/Nodes: No enlarged mediastinal or hilar lymph nodes. No thyroid nodule. Patulous esophagus with small hiatal hernia. No definite wall thickening. Lungs/Pleura: Dependent atelectasis. Subsegmental atelectasis in the lingula. No septal thickening or pulmonary edema. No pleural effusion. Trachea and mainstem bronchi are patent. Upper  Abdomen: Contrast refluxing into the hepatic veins and IVC. No other acute findings. Musculoskeletal: There are no acute or suspicious osseous abnormalities. Review of the MIP images confirms the above findings. IMPRESSION: 1. No pulmonary embolus. 2. Mild multi chamber cardiomegaly with contrast refluxing into the hepatic veins and IVC consistent with elevated right heart pressures. 3. Patulous esophagus with small hiatal hernia. Aortic Atherosclerosis (ICD10-I70.0). Electronically Signed   By: Keith Rake M.D.   On: 12/02/2019 23:42   CARDIAC CATHETERIZATION  Result Date: 12/05/2019 Conclusions: 1. Mildly elevated left heart filling pressure. 2. Mildly to moderately elevated right heart and pulmonary artery pressures. 3. Normal cardiac output/index. Recommendations: 1. Per primary and cardiology consult teams. Nelva Bush, MD [facility]   DG Chest Port 1 View  Result Date: 12/02/2019 CLINICAL DATA:  Altered level of consciousness. EXAM: PORTABLE CHEST 1  VIEW COMPARISON:  04/28/2019 FINDINGS: Cardiomegaly. Unchanged mediastinal contours with aortic atherosclerosis. Previous right pleural effusion is diminished and is likely resolved. Bronchovascular crowding versus vascular congestion. No focal airspace disease or pneumothorax. No acute osseous abnormalities are seen IMPRESSION: Cardiomegaly. Bronchovascular crowding versus vascular congestion. Electronically Signed   By: Keith Rake M.D.   On: 12/02/2019 16:28   ECHOCARDIOGRAM COMPLETE  Result Date: 12/03/2019    ECHOCARDIOGRAM REPORT   Patient Name:   ALBERTO SCHOCH Date of Exam: 12/03/2019 Medical Rec #:  [id number]      Height:       54.0 in Accession #:    [id number]     Weight:       109.1 lb Date of Birth:  [birth year]      BSA:          1.337 m Patient Age:    49 years       BP:           139/56 mmHg Patient Gender: F              HR:           75 bpm. Exam Location:  Inpatient Procedure: 2D Echo, Cardiac Doppler and Color Doppler  Indications:    Syncope 780.2 / R55  History:        Patient has prior history of Echocardiogram examinations, most                 recent 06/10/2018. Stroke; Risk Factors:Hypertension, Diabetes                 and Dyslipidemia. CKD (chronic kidney disease) stage 5, GFR less                 than 15 ml/min.  Sonographer:    Alvino Chapel RCS Referring Phys: [id number] [place]  1. Left ventricular ejection fraction, by estimation, is 55 to 60%. The left ventricle has normal function. The left ventricle has no regional wall motion abnormalities. Left ventricular diastolic parameters were normal.  2. Severe TR and RV dilatation suggest elevated right heart pressures consider CTA to r/o PE in setting of syncope . Right ventricular systolic function is mildly reduced. The right ventricular size is normal. There is mildly elevated pulmonary artery systolic pressure.  3. Right atrial size was mildly dilated.  4. The mitral valve is normal in structure. Mild to moderate mitral valve regurgitation. No evidence of mitral stenosis.  5. 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  6. The aortic valve is normal in structure. Aortic valve regurgitation is not visualized. No aortic stenosis is present.  7. The inferior vena cava is normal in size with <50% respiratory variability, suggesting right atrial pressure of 8 mmHg. FINDINGS  Left Ventricle: Left ventricular ejection fraction, by estimation, is 55 to 60%. The left ventricle has normal function. The left ventricle has no regional wall motion abnormalities. The left ventricular internal cavity size was normal in size. There is  no left ventricular hypertrophy. Left ventricular diastolic parameters were normal. Right Ventricle: Severe TR and RV dilatation suggest elevated right heart pressures consider CTA to r/o PE in setting of syncope. The right ventricular size is normal. Right vetricular wall thickness was not assessed. Right ventricular systolic function is  mildly reduced. There is mildly elevated pulmonary artery systolic pressure. The tricuspid regurgitant velocity is 3.00 m/s, and with an assumed right atrial pressure of 3 mmHg, the estimated right ventricular systolic pressure is 39.0  mmHg. Left Atrium: Left atrial size was normal in size. Right Atrium: Right atrial size was mildly dilated. Pericardium: There is no evidence of pericardial effusion. Mitral Valve: The mitral valve is normal in structure. There is mild thickening of the mitral valve leaflet(s). There is mild calcification of the mitral valve leaflet(s). Normal mobility of the mitral valve leaflets. Mild to moderate mitral valve regurgitation. No evidence of mitral valve stenosis. Tricuspid Valve: The tricuspid valve is normal in structure. 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 No evidence of tricuspid stenosis. Aortic Valve: The aortic valve is normal in structure. Aortic valve regurgitation is not visualized. No aortic stenosis is present. Pulmonic Valve: The pulmonic valve was normal in structure. Pulmonic valve regurgitation is not visualized. No evidence of pulmonic stenosis. Aorta: The aortic root is normal in size and structure. Venous: The inferior vena cava is normal in size with less than 50% respiratory variability, suggesting right atrial pressure of 8 mmHg. IAS/Shunts: No atrial level shunt detected by color flow Doppler.  LEFT VENTRICLE PLAX 2D LVIDd:         4.50 cm  Diastology LVIDs:         2.70 cm  LV e' lateral:   8.81 cm/s LV PW:         1.00 cm  LV E/e' lateral: 15.3 LV IVS:        1.00 cm  LV e' medial:    7.72 cm/s LVOT diam:     1.50 cm  LV E/e' medial:  17.5 LV SV:         48 LV SV Index:   36 LVOT Area:     1.77 cm  RIGHT VENTRICLE RV S prime:     12.60 cm/s TAPSE (M-mode): 2.2 cm LEFT ATRIUM             Index       RIGHT ATRIUM           Index LA diam:        3.60 cm 2.69 cm/m  RA Area:     16.70 cm LA Vol (A2C):   55.6 ml 41.58 ml/m RA Volume:   50.10 ml  37.47 ml/m  LA Vol (A4C):   45.8 ml 34.25 ml/m LA Biplane Vol: 50.9 ml 38.07 ml/m  AORTIC VALVE LVOT Vmax:   110.00 cm/s LVOT Vmean:  72.000 cm/s LVOT VTI:    0.269 m  AORTA Ao Root diam: 2.80 cm MITRAL VALVE                 TRICUSPID VALVE MV Area (PHT): 4.31 cm      TR Peak grad:   36.0 mmHg MV Decel Time: 176 msec      TR Vmax:        300.00 cm/s MR Peak grad:    140.2 mmHg MR Mean grad:    100.0 mmHg  SHUNTS MR Vmax:         592.00 cm/s Systemic VTI:  0.27 m MR Vmean:        477.0 cm/s  Systemic Diam: 1.50 cm MR PISA:         1.01 cm MR PISA Eff ROA: 5 mm MR PISA Radius:  0.40 cm MV E velocity: 135.00 cm/s MV A velocity: 113.00 cm/s MV E/A ratio:  1.19 Jenkins Rouge MD Electronically signed by Jenkins Rouge MD Signature Date/Time: 12/03/2019/4:19:51 PM    Final     Microbiology: Recent Results (from the past 240 hour(s))  Blood  Cultures (routine x 2)     Status: None (Preliminary result)   Collection Time: 12/02/19  3:46 PM   Specimen: BLOOD RIGHT HAND  Result Value Ref Range Status   Specimen Description BLOOD RIGHT HAND  Final   Special Requests   Final    BOTTLES DRAWN AEROBIC ONLY Blood Culture results may not be optimal due to an inadequate volume of blood received in culture bottles   Culture   Final    NO GROWTH 4 DAYS Performed at [facility], [place] [address], [place], [place] [address]    Report Status PENDING  Incomplete  Blood Cultures (routine x 2)     Status: None (Preliminary result)   Collection Time: 12/02/19  4:02 PM   Specimen: BLOOD  Result Value Ref Range Status   Specimen Description BLOOD RIGHT ANTECUBITAL  Final   Special Requests   Final    BOTTLES DRAWN AEROBIC AND ANAEROBIC Blood Culture adequate volume   Culture   Final    NO GROWTH 4 DAYS Performed at [facility], [place] [address], [place], [place] [address]    Report Status PENDING  Incomplete  SARS Coronavirus 2 by RT PCR (hospital order, performed in [facility] hospital lab) Nasopharyngeal  Nasopharyngeal Swab     Status: None   Collection Time: 12/02/19  5:00 PM   Specimen: Nasopharyngeal Swab  Result Value Ref Range Status   SARS Coronavirus 2 NEGATIVE NEGATIVE Final    Comment: (NOTE) SARS-CoV-2 target nucleic acids are NOT DETECTED.  The SARS-CoV-2 RNA is generally detectable in upper and lower respiratory specimens during the acute phase of infection. The lowest concentration of SARS-CoV-2 viral copies this assay can detect is 250 copies / mL. A negative result does not preclude SARS-CoV-2 infection and should not be used as the sole basis for treatment or other patient management decisions.  A negative result may occur with improper specimen collection / handling, submission of specimen other than nasopharyngeal swab, presence of viral mutation(s) within the areas targeted by this assay, and inadequate number of viral copies (<250 copies / mL). A negative result must be combined with clinical observations, patient history, and epidemiological information.  Fact Sheet for Patients:   StrictlyIdeas.no  Fact Sheet for Healthcare Providers: BankingDealers.co.za  This test is not yet approved or  cleared by the [place] FDA and has been authorized for detection and/or diagnosis of SARS-CoV-2 by FDA under an Emergency Use Authorization (EUA).  This EUA will remain in effect (meaning this test can be used) for the duration of the COVID-19 declaration under Section 564(b)(1) of the Act, 21 U.S.C. section 360bbb-3(b)(1), unless the authorization is terminated or revoked sooner.  Performed at [facility], [place] [address] [place], [place] [address]   MRSA PCR Screening     Status: Abnormal   Collection Time: 12/05/19  2:39 PM   Specimen: Nasal Mucosa; Nasopharyngeal  Result Value Ref Range Status   MRSA by PCR POSITIVE (A) NEGATIVE Final    Comment:        The GeneXpert MRSA Assay (FDA approved for NASAL  specimens only), is one component of a comprehensive MRSA colonization surveillance program. It is not intended to diagnose MRSA infection nor to guide or monitor treatment for MRSA infections. RESULT CALLED TO, READ BACK BY AND VERIFIED WITH: Felipa Eth RN 17:00 12/05/19 ([email or web address]) Performed at [facility], [place] [address] [place],  [address]  Labs: Basic Metabolic Panel: Recent Labs  Lab 12/02/19 1603 12/02/19 1721 12/03/19 0329 12/04/19 0448 12/05/19 0301 12/05/19 1132 12/06/19 0352  NA 135   < > 137 137 136 140 138  K 5.0   < > 4.4 3.5 4.5 4.7 4.9  CL 96*  --  98 98 98  --  101  CO2 27  --  24 28 27   --  23  GLUCOSE 299*  --  156* 196* 168*  --  165*  BUN 22*  --  28* 6 27*  --  48*  CREATININE 3.75*  --  4.31* 1.91* 4.37*  --  5.16*  CALCIUM 8.7*  --  8.6* 8.5* 9.2  --  9.4  PHOS  --   --   --  3.3 4.3  --  5.4*   < > = values in this interval not displayed.   Liver Function Tests: Recent Labs  Lab 12/02/19 1603 12/04/19 0448 12/05/19 0301 12/06/19 0352  AST 20  --   --   --   ALT 22  --   --   --   ALKPHOS 103  --   --   --   BILITOT 0.5  --   --   --   PROT 7.5  --   --   --   ALBUMIN 3.6 3.3* 3.3* 3.4*   No results for input(s): LIPASE, AMYLASE in the last 168 hours. No results for input(s): AMMONIA in the last 168 hours. CBC: Recent Labs  Lab 12/02/19 1603 12/02/19 1721 12/03/19 0329 12/04/19 0448 12/05/19 0301 12/05/19 1132 12/06/19 0352  WBC 7.9  --  7.7 9.3 7.8  --  8.0  NEUTROABS 6.2  --   --   --  5.0  --   --   HGB 8.8*   < > 8.5* 9.0* 9.0* 9.2* 9.7*  HCT 28.6*   < > 27.1* 28.2* 28.5* 27.0* 30.3*  MCV 96.9  --  96.4 93.4 94.4  --  94.7  PLT 216  --  241 268 290  --  319   < > = values in this interval not displayed.   Cardiac Enzymes: No results for input(s): CKTOTAL, CKMB, CKMBINDEX, TROPONINI in the last 168 hours. BNP: BNP (last 3 results) Recent Labs    12/03/19 0329  BNP 1,275.7*    ProBNP (last 3  results) No results for input(s): PROBNP in the last 8760 hours.  CBG: Recent Labs  Lab 12/05/19 0739 12/05/19 1159 12/05/19 1556 12/05/19 2122 12/06/19 0733  GLUCAP 184* 166* 183* 86 173*       Signed:  Irine Seal MD.  [facility] 12/06/2019, 4:15 PM

## 2019-12-06 NOTE — Progress Notes (Signed)
.  ev

## 2019-12-06 NOTE — Progress Notes (Signed)
PT Cancellation Note  Patient Details Name: [name] Michael MRN: [id number] DOB: 1960/10/05   Cancelled Treatment:    Reason Eval/Treat Not Completed: Other (comment) Checked in am and pm and pt at HD.  Will f/u as able.  Abran Richard, PT Acute Rehab Services Pager [phone] [facility] [place] 12/06/2019, 3:54 PM

## 2019-12-07 ENCOUNTER — Ambulatory Visit: Payer: Medicare Other | Admitting: Family Medicine

## 2019-12-07 DIAGNOSIS — E1169 Type 2 diabetes mellitus with other specified complication: Secondary | ICD-10-CM

## 2019-12-07 DIAGNOSIS — R531 Weakness: Secondary | ICD-10-CM

## 2019-12-07 DIAGNOSIS — Z659 Problem related to unspecified psychosocial circumstances: Secondary | ICD-10-CM

## 2019-12-07 DIAGNOSIS — I50813 Acute on chronic right heart failure: Secondary | ICD-10-CM

## 2019-12-07 LAB — CULTURE, BLOOD (ROUTINE X 2)
Culture: NO GROWTH
Culture: NO GROWTH
Special Requests: ADEQUATE

## 2019-12-07 LAB — GLUCOSE, CAPILLARY
Glucose-Capillary: 169 mg/dL — ABNORMAL HIGH (ref 70–99)
Glucose-Capillary: 196 mg/dL — ABNORMAL HIGH (ref 70–99)
Glucose-Capillary: 68 mg/dL — ABNORMAL LOW (ref 70–99)

## 2019-12-07 NOTE — Progress Notes (Signed)
Occupational Therapy Treatment Patient Details Name: [name] Michael MRN: [id number] DOB: 1960/10/25 Today's Date: 12/07/2019    History of present illness 58 y.o. female with PMH of HTN, HLD, DM, ESRD dialysis TTS, and depression who presented to ED 12/02/19 for AMS and admitted for hypoxia and syncope workup. CT head and CTA chest no acute changes.    OT comments  Pt progressing toward established OT goals. Pt asleep upon arrival, woke up to therapist knocking and agreeable to session. Pt continues to demonstrate cognitive limitations impacting safety and independence with ADL/IADL and functional mobility. She requires minguard for functional mobility without AD. Pt completed grooming at sink level with minguard assistance, mild instability noted but pt able to self-correct. Pt will continue to benefit from skilled OT services to maximize safety and independence with ADL/IADL and functional mobility. Will continue to follow acutely and progress as tolerated.    Follow Up Recommendations  SNF;Supervision/Assistance - 24 hour    Equipment Recommendations  Other (comment) (defer to next venue)    Recommendations for Other Services      Precautions / Restrictions Precautions Precautions: Fall Precaution Comments: had recent fall vs ?syncope with rt 4th finger fracture Restrictions Weight Bearing Restrictions: No       Mobility Bed Mobility Overal bed mobility: Needs Assistance Bed Mobility: Supine to Sit     Supine to sit: Supervision     General bed mobility comments: for safety, HOB elevated   Transfers Overall transfer level: Needs assistance Equipment used: None Transfers: Sit to/from Stand Sit to Stand: Min guard         General transfer comment: mingaurd for safety    Balance Overall balance assessment: History of Falls;Needs assistance Sitting-balance support: Feet unsupported Sitting balance-Leahy Scale: Fair     Standing balance support: No upper extremity  supported;During functional activity Standing balance-Leahy Scale: Fair Standing balance comment: able to stand unsupported did not challenge balance too much, pt with mild instability noted                           ADL either performed or assessed with clinical judgement   ADL Overall ADL's : Needs assistance/impaired     Grooming: Min guard;Standing;Wash/dry hands;Wash/dry face;Oral care;Brushing hair Grooming Details (indicate cue type and reason): completed at sink level             Lower Body Dressing: Min guard;Sit to/from stand Lower Body Dressing Details (indicate cue type and reason): able to access bilateral feet sitting EOB Toilet Transfer: Min guard;Ambulation Toilet Transfer Details (indicate cue type and reason): ambulating in room without AD Toileting- Clothing Manipulation and Hygiene: Min guard;Sit to/from stand Toileting - Clothing Manipulation Details (indicate cue type and reason): minguard for safety     Functional mobility during ADLs: Min guard General ADL Comments: mingaurd for in room mobility     Vision       Perception     Praxis      Cognition Arousal/Alertness: Awake/alert Behavior During Therapy: Flat affect Overall Cognitive Status: No family/caregiver present to determine baseline cognitive functioning                                 General Comments: pt with slow processing, decreased short term memory and easily distractable during session        Exercises     Shoulder Instructions  General Comments pt on RA, VSS throughout    Pertinent Vitals/ Pain       Pain Assessment: 0-10 Pain Score: 4  Pain Location: 4th digit while using RUE Pain Descriptors / Indicators: Sore;Discomfort Pain Intervention(s): Monitored during session  Home Living                                          Prior Functioning/Environment              Frequency  Min 2X/week        Progress  Toward Goals  OT Goals(current goals can now be found in the care plan section)  Progress towards OT goals: Progressing toward goals  Acute Rehab OT Goals Patient Stated Goal: for her hand to stop hurting OT Goal Formulation: With patient Time For Goal Achievement: 12/18/19 Potential to Achieve Goals: Good ADL Goals Pt Will Perform Grooming: with modified independence;standing Pt Will Perform Lower Body Bathing: with modified independence;sit to/from stand;sitting/lateral leans Pt Will Perform Lower Body Dressing: with modified independence;sit to/from stand;sitting/lateral leans Pt Will Transfer to Toilet: with modified independence;ambulating Pt Will Perform Toileting - Clothing Manipulation and hygiene: with modified independence;sit to/from stand;sitting/lateral leans Pt/caregiver will Perform Home Exercise Program: Increased strength;With written HEP provided;Right Upper extremity (and fine motor, within pain limitations)  Plan Discharge plan remains appropriate    Co-evaluation                 AM-PAC OT "6 Clicks" Daily Activity     Outcome Measure   Help from another person eating meals?: A Little Help from another person taking care of personal grooming?: A Little Help from another person toileting, which includes using toliet, bedpan, or urinal?: A Little Help from another person bathing (including washing, rinsing, drying)?: A Little Help from another person to put on and taking off regular upper body clothing?: A Little Help from another person to put on and taking off regular lower body clothing?: A Little 6 Click Score: 18    End of Session Equipment Utilized During Treatment: Gait belt  OT Visit Diagnosis: Muscle weakness (generalized) (M62.81);Unsteadiness on feet (R26.81);History of falling (Z91.81);Pain Pain - Right/Left: Right Pain - part of body: Hand   Activity Tolerance Patient tolerated treatment well   Patient Left in chair;with call bell/phone  within reach (NT verified okay to leave chair alarm off)   Nurse Communication Mobility status        Time: 5364-6803 OT Time Calculation (min): 19 min  Charges: OT General Charges $OT Visit: 1 Visit OT Treatments $Self Care/Home Management : 8-22 mins  Helene Kelp OTR/L Acute Rehabilitation Services Office: [phone]    Wyn Forster 12/07/2019, 5:04 PM

## 2019-12-07 NOTE — Discharge Summary (Signed)
Physician Discharge Summary  ANGELLYNN KIMBERLIN HYQ:[id number] DOB: 14-Sep-1960 DOA: 12/02/2019  PCP: Charlott Rakes, MD  Admit date: 12/02/2019 Discharge date: 12/07/2019  Time spent: 60 minutes  Recommendations for Outpatient Follow-up:  1. Follow-up with MD at skilled nursing facility.  Patient will be discharged and be set up with outpatient monitor by cardiology. 2. Follow-up with Dr. Harrington Challenger, cardiology for follow-up on severe tricuspid valve regurgitation. 3. Follow-up in hemodialysis unit on regular scheduled day of 12/08/2019. 4. Follow-up with Charlott Rakes, MD in 2 weeks.   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)   Hyperlipidemia   Essential hypertension   GERD   Depression   Poor social situation   CKD (chronic kidney disease) stage 5, GFR less than 15 ml/min (HCC)   Wrist pain, acute, right   ESRD (end stage renal disease) (HCC)   GERD (gastroesophageal reflux disease)   Hypertension   Type II diabetes mellitus (HCC)   Weakness   Severe tricuspid regurgitation   Acute on chronic right-sided heart failure ([place])   Hypoxia   Discharge Condition: Stable and improved  Diet recommendation: Heart healthy  Filed Weights   12/04/19 0030 12/05/19 0337 12/06/19 1033  Weight: 50.3 kg 51.6 kg 48.7 kg    History of present illness:  HPI per Dr. [name] is a 59 y.o. female with PMH of HTN, HLD, DM, ESRD, MDD who presented for AMS and admitted for hypoxia and syncope workup.  Patient reported that she was at her nursing facility today and was doing her laundry and started to feel weak so she went to the dining room and sat down and per her reports, she passed out. She was unsure how long this happened. Per nursing home report, she was brought to ER for AMS. Patient reported starting yesterday she was given Moxifloxacin and Oxycodone which are new meds and she is  unsure why. Denied other complaints. Denied headache, dizziness, fever, chills, cough, SOB, chest pain, abdominal pain, nausea, vomiting, diarrhea, constipation, dysuria, hematuria, hematochezia, melena, difficulty moving arms/legs, speech difficulty, trouble eating, confusion or any other complaints.  In the ED: Slightly hypotensive and temp 95.44F otherwise stable on room air. Labs remarkable for glucose 299, Cr 3.75, Hgb 8.8, WBC 7.9, Ethanol level and UDS WNL, Lactate 2.3, UA without evidence of infection, COVID neg.  CXR: Cardiomegaly. Bronchovascular crowding versus vascular congestion.  CT Head and CTA Chest: Non-acute  EDP requested admission for hypoxia.  Hospital Course:  Acute metabolic encephalopathy and Syncope with Hypoxia, POA: Patient presented from nursing facility with generalized weakness and concern that she may have passed out as well as altered mental status.  Patient noted to have recently been given antibiotics due to concern for possible UTI and oxycodone for right wrist pain after a fall.  On presentation to the ED patient noted to be slightly hypotensive with some hypothermia.  Chest x-ray with vascular congestion.  CT angiogram chest negative for PE, mild multichamber cardiomegaly with contrast refluxing into the hepatic veins and IVC consistent with elevated right heart pressures.  Patulous esophagus with small hiatal hernia.  CT head negative for any acute abnormalities.  Patient noted to have a prior history of altered mental status with hypoxia in the past felt likely secondary to volume overload.  Questionable syncope may have been in part medication induced from pain regimen given that nursing facility.  2D echo obtained with  severe tricuspid regurgitation and right ventricular dilatation. Patient seen in consultation by cardiology and patient underwent right heart cardiac catheterization 12/05/2019 that showed  mild elevated left heart filling pressure, mildly to  moderately elevated right heart and pulmonary artery pressures, normal cardiac output/index.  Cardiology recommended increase volume removal with dialysis.  No further inpatient cardiac work-up.  Outpatient follow-up with cardiology.  No arrhythmias noted on telemetry.  Cardiology recommended outpatient monitor which will be arranged.  Patient had no further syncopal episodes during the hospitalization, hypoxia improved and resolved.  Outpatient follow-up.  Severe tricuspid valvular regurgitation and right ventricular dilatation: Noted on 2D echo.  Questionable etiology.  CT angiogram chest negative for PE.  Patient does have a tobacco history however CT findings does not show significant COPD/emphysema.  Cardiology consulted and recommended right heart cath to define pulmonary pressures  12/05/2019 which showed mild elevated left heart filling pressure, mildly to moderately elevated right heart and pulmonary artery pressures, normal cardiac output/index.  Cardiology recommended increase volume removal with dialysis.  No further inpatient cardiac work-up.  Outpatient follow-up with cardiology.  End-stage renal disease: On Tuesday Thursday Saturday schedule.  Patient noted to have missed hemodialysis on 11/29/2019.  Nephrology consulted and patient underwent hemodialysis on 12/03/2019, 12/06/2019.  Follow-up in outpatient hemodialysis center as scheduled on hemodialysis day.  Well-controlled diabetes mellitus type 2: Hemoglobin A1c 6.5 on 04/28/2019.  Repeat hemoglobin A1c at 8.8 (12/03/2019).  Patient maintained on home regimen new coverage insulin as well as sliding scale insulin and home regimen Neurontin.  Outpatient follow-up.  Gastroesophageal reflux disease: Maintained on PPI.    Hyperlipidemia: Maintained on statin.   Hypertension: Blood pressure noted to be borderline on admission.  BP improved .  Patient home regimen of Norvasc was discontinued.  Patient maintained on Lopressor during the  hospitalization.  Outpatient follow-up.  If patient's blood pressure remains elevated in the outpatient setting may consider resumption of patient's Norvasc.  Depression: Patient maintained on home regimen of Prozac.  Right wrist pain: Mechanical fall.  Plain films of the right wrist with lucency at the base of the ulnar styloid which may be acute or chronic.  Evidence of old distal radial/ulnar meta physeal fractures.  Pain management with Tylenol.  Oxycodone discontinued due to concerns that may have contributed to patient's syncopal episode.  Outpatient follow-up.  Tobacco abuse: Tobacco cessation.  Patient refused nicotine patch during the hospitalization.  Outpatient follow-up.     Procedures:  Plain films of the right wrist 12/03/2019  CT angiogram chest 12/02/2019  CT head without contrast 12/02/2019  2D echo 12/03/2019  Right heart cardiac catheterization per Dr. end 12/05/2019  Consultations:  Nephrology: Dr. Carolin Sicks 12/03/2019  Cardiology: Dr. Harrington Challenger 12/04/2019  Discharge Exam: Vitals:   12/06/19 2203 12/07/19 0756  BP: (!) 155/58 112/86  Pulse: 78 74  Resp: 16 19  Temp: 98.2 F (36.8 C) 99 F (37.2 C)  SpO2: 99% 93%    Exam:  Constitutional:  . The patient is awake, alert, and oriented x 3. No acute distress. Respiratory:  . No increased work of breathing. . No wheezes, rales, or rhonchi . No tactile fremitus Cardiovascular:  . Regular rate and rhythm . No murmurs, ectopy, or gallups. . No lateral PMI. No thrills. Abdomen:  . Abdomen is soft, non-tender, non-distended . No hernias, masses, or organomegaly . Normoactive bowel sounds.  Musculoskeletal:  . No cyanosis, clubbing, or edema Skin:  . No rashes, lesions, ulcers . palpation of skin: no induration or  nodules Neurologic:  . CN 2-12 intact . Sensation all 4 extremities intact Psychiatric:  . Mental status o Mood, affect appropriate o Orientation to person, place, time  . judgment and  insight appear intact  Discharge Instructions   Discharge Instructions    Diet - low sodium heart healthy   Complete by: As directed    Increase activity slowly   Complete by: As directed      Allergies as of 12/07/2019      Reactions   Hydrocodone Nausea And Vomiting   Listed in Epic, but not on the [facility]      Medication List    STOP taking these medications   amLODipine 10 MG tablet Commonly known as: NORVASC   OXYCODONE HCL PO     TAKE these medications   acetaminophen 325 MG tablet Commonly known as: TYLENOL Take 650 mg by mouth every 4 (four) hours as needed for mild pain.   aspirin 81 MG chewable tablet Chew 81 mg by mouth daily.   atorvastatin 40 MG tablet Commonly known as: LIPITOR Take 40 mg by mouth at bedtime.   bacitracin ointment Apply topically 2 (two) times daily for 5 days.   Banophen 25 mg capsule Generic drug: diphenhydrAMINE Take 25 mg by mouth every 4 (four) hours as needed for itching or allergies.   CertaVite Senior/Antioxidant Tabs Take 1 tablet by mouth daily.   famotidine 10 MG tablet Commonly known as: PEPCID Take 10 mg by mouth daily.   FLUoxetine 10 MG capsule Commonly known as: PROZAC Take 10 mg by mouth daily.   gabapentin 100 MG capsule Commonly known as: NEURONTIN Take 1 capsule (100 mg total) by mouth 2 (two) times daily.   GaviLAX 17 GM/SCOOP powder Generic drug: polyethylene glycol powder Take 17 g by mouth See admin instructions. Mix 17 grams of powder into 8 ounces of fluid, stir, and drink (by mouth) once a day   insulin lispro 100 UNIT/ML injection Commonly known as: HUMALOG Inject 12 Units into the skin 3 (three) times daily with meals. Hold for BGL <150 or if patient is not eating.   loratadine 10 MG tablet Commonly known as: CLARITIN Take 10 mg by mouth daily.   metoprolol tartrate 100 MG tablet Commonly known as: LOPRESSOR Take 100 mg by mouth 2 (two) times daily.   omeprazole 40 MG capsule Commonly  known as: PRILOSEC Take 40 mg by mouth daily.   SACCHAROMYCES BOULARDII PO Take 1 capsule by mouth 2 (two) times daily.   senna-docusate 8.6-50 MG tablet Commonly known as: Senokot-S Take 1 tablet by mouth at bedtime as needed for mild constipation.   sevelamer carbonate 800 MG tablet Commonly known as: RENVELA Take 800 mg by mouth 3 (three) times daily with meals.   sodium bicarbonate 650 MG tablet Take 1,300 mg by mouth 2 (two) times daily.   Vitamin D (Ergocalciferol) 1.25 MG (50000 UNIT) Caps capsule Commonly known as: DRISDOL Take 1 capsule (50,000 Units total) by mouth every 7 (seven) days. What changed: when to take this      Allergies  Allergen Reactions  . Hydrocodone Nausea And Vomiting    Listed in Epic, but not on the [facility]    Contact information for follow-up providers    Fay Records, MD Follow up.   Specialty: Cardiology Why: office will call with date and time of appointment and monitor set up  Contact information: [address] [place] [address] [phone]  Charlott Rakes, MD. Schedule an appointment as soon as possible for a visit in 2 week(s).   Specialty: Family Medicine Contact information: [place]  [address] [phone]        MD AT SNF Follow up.        HD Follow up on 12/08/2019.   Why: Follow-up at hemodialysis on scheduled hemodialysis today           Contact information for after-discharge care    [facility] .   Service: Skilled Nursing Contact information: [place] [phone]                   The results of significant diagnostics from this hospitalization (including imaging, microbiology, ancillary and laboratory) are listed below for reference.    Significant Diagnostic Studies: DG Wrist Complete Right  Result Date: 12/03/2019 CLINICAL DATA:  Fall 1 week ago with right wrist pain.  EXAM: RIGHT WRIST - COMPLETE 3+ VIEW COMPARISON:  08/31/2018 FINDINGS: Mild degenerate changes of the radiocarpal joint and radial side of the carpal bones/first carpometacarpal joint. Evidence of patient's old distal radial and ulnar metaphyseal fractures. Subtle lucency at the base of the ulnar styloid which may be acute or chronic. IMPRESSION: Lucency at the base of the ulnar styloid which may be acute or chronic. Evidence of old distal radial/ulnar metaphyseal fractures. Electronically Signed   By: Marin Olp M.D.   On: 12/03/2019 11:49   CT HEAD WO CONTRAST  Result Date: 12/02/2019 CLINICAL DATA:  Altered mental status. EXAM: CT HEAD WITHOUT CONTRAST TECHNIQUE: Contiguous axial images were obtained from the base of the skull through the vertex without intravenous contrast. COMPARISON:  CT head dated April 28, 2019. FINDINGS: Brain: No evidence of acute infarction, hemorrhage, hydrocephalus, extra-axial collection or mass lesion/mass effect. Small bilateral basal ganglia chronic lacunar infarcts again noted. Stable mild atrophy and chronic microvascular ischemic changes. Vascular: Calcified atherosclerosis at the skullbase. No hyperdense vessel. Skull: Normal. Negative for fracture or focal lesion. Sinuses/Orbits: No acute finding. Chronic small vitreous hemorrhage in the posterior left globe. Other: None. IMPRESSION: 1. No acute intracranial abnormality. Electronically Signed   By: Titus Dubin M.D.   On: 12/02/2019 18:56   CT Angio Chest PE W and/or Wo Contrast  Result Date: 12/02/2019 CLINICAL DATA:  Shortness of breath.  Altered mental status. EXAM: CT ANGIOGRAPHY CHEST WITH CONTRAST TECHNIQUE: Multidetector CT imaging of the chest was performed using the standard protocol during bolus administration of intravenous contrast. Multiplanar CT image reconstructions and MIPs were obtained to evaluate the vascular anatomy. CONTRAST:  44mL OMNIPAQUE IOHEXOL 350 MG/ML SOLN COMPARISON:  Radiograph  earlier this day. FINDINGS: Cardiovascular: There are no filling defects within the pulmonary arteries to suggest pulmonary embolus. Aortic atherosclerosis. No aneurysm. Common origin of the brachiocephalic and left common carotid artery, variant arch anatomy. Mild multi chamber cardiomegaly. There is contrast refluxing into the hepatic veins and IVC. No pericardial effusion. Mediastinum/Nodes: No enlarged mediastinal or hilar lymph nodes. No thyroid nodule. Patulous esophagus with small hiatal hernia. No definite wall thickening. Lungs/Pleura: Dependent atelectasis. Subsegmental atelectasis in the lingula. No septal thickening or pulmonary edema. No pleural effusion. Trachea and mainstem bronchi are patent. Upper Abdomen: Contrast refluxing into the hepatic veins and IVC. No other acute findings. Musculoskeletal: There are no acute or suspicious osseous abnormalities. Review of the MIP images confirms the above findings. IMPRESSION: 1. No pulmonary embolus. 2. Mild multi chamber  cardiomegaly with contrast refluxing into the hepatic veins and IVC consistent with elevated right heart pressures. 3. Patulous esophagus with small hiatal hernia. Aortic Atherosclerosis (ICD10-I70.0). Electronically Signed   By: Keith Rake M.D.   On: 12/02/2019 23:42   CARDIAC CATHETERIZATION  Result Date: 12/05/2019 Conclusions: 1. Mildly elevated left heart filling pressure. 2. Mildly to moderately elevated right heart and pulmonary artery pressures. 3. Normal cardiac output/index. Recommendations: 1. Per primary and cardiology consult teams. Nelva Bush, MD [facility]   DG Chest Port 1 View  Result Date: 12/02/2019 CLINICAL DATA:  Altered level of consciousness. EXAM: PORTABLE CHEST 1 VIEW COMPARISON:  04/28/2019 FINDINGS: Cardiomegaly. Unchanged mediastinal contours with aortic atherosclerosis. Previous right pleural effusion is diminished and is likely resolved. Bronchovascular crowding versus vascular  congestion. No focal airspace disease or pneumothorax. No acute osseous abnormalities are seen IMPRESSION: Cardiomegaly. Bronchovascular crowding versus vascular congestion. Electronically Signed   By: Keith Rake M.D.   On: 12/02/2019 16:28   ECHOCARDIOGRAM COMPLETE  Result Date: 12/03/2019    ECHOCARDIOGRAM REPORT   Patient Name:   LAVONDA THAL Date of Exam: 12/03/2019 Medical Rec #:  [id number]      Height:       54.0 in Accession #:    [id number]     Weight:       109.1 lb Date of Birth:  [birth year]      BSA:          1.337 m Patient Age:    53 years       BP:           139/56 mmHg Patient Gender: F              HR:           75 bpm. Exam Location:  Inpatient Procedure: 2D Echo, Cardiac Doppler and Color Doppler Indications:    Syncope 780.2 / R55  History:        Patient has prior history of Echocardiogram examinations, most                 recent 06/10/2018. Stroke; Risk Factors:Hypertension, Diabetes                 and Dyslipidemia. CKD (chronic kidney disease) stage 5, GFR less                 than 15 ml/min.  Sonographer:    Alvino Chapel RCS Referring Phys: [id number] [place]  1. Left ventricular ejection fraction, by estimation, is 55 to 60%. The left ventricle has normal function. The left ventricle has no regional wall motion abnormalities. Left ventricular diastolic parameters were normal.  2. Severe TR and RV dilatation suggest elevated right heart pressures consider CTA to r/o PE in setting of syncope . Right ventricular systolic function is mildly reduced. The right ventricular size is normal. There is mildly elevated pulmonary artery systolic pressure.  3. Right atrial size was mildly dilated.  4. The mitral valve is normal in structure. Mild to moderate mitral valve regurgitation. No evidence of mitral stenosis.  5. 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  6. The aortic valve is normal in structure. Aortic valve regurgitation is not visualized. No aortic stenosis is  present.  7. The inferior vena cava is normal in size with <50% respiratory variability, suggesting right atrial pressure of 8 mmHg. FINDINGS  Left Ventricle: Left ventricular ejection fraction, by estimation, is 55 to 60%. The left ventricle has normal function.  The left ventricle has no regional wall motion abnormalities. The left ventricular internal cavity size was normal in size. There is  no left ventricular hypertrophy. Left ventricular diastolic parameters were normal. Right Ventricle: Severe TR and RV dilatation suggest elevated right heart pressures consider CTA to r/o PE in setting of syncope. The right ventricular size is normal. Right vetricular wall thickness was not assessed. Right ventricular systolic function is mildly reduced. There is mildly elevated pulmonary artery systolic pressure. The tricuspid regurgitant velocity is 3.00 m/s, and with an assumed right atrial pressure of 3 mmHg, the estimated right ventricular systolic pressure is 40.9 mmHg. Left Atrium: Left atrial size was normal in size. Right Atrium: Right atrial size was mildly dilated. Pericardium: There is no evidence of pericardial effusion. Mitral Valve: The mitral valve is normal in structure. There is mild thickening of the mitral valve leaflet(s). There is mild calcification of the mitral valve leaflet(s). Normal mobility of the mitral valve leaflets. Mild to moderate mitral valve regurgitation. No evidence of mitral valve stenosis. Tricuspid Valve: The tricuspid valve is normal in structure. 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 No evidence of tricuspid stenosis. Aortic Valve: The aortic valve is normal in structure. Aortic valve regurgitation is not visualized. No aortic stenosis is present. Pulmonic Valve: The pulmonic valve was normal in structure. Pulmonic valve regurgitation is not visualized. No evidence of pulmonic stenosis. Aorta: The aortic root is normal in size and structure. Venous: The inferior vena cava is  normal in size with less than 50% respiratory variability, suggesting right atrial pressure of 8 mmHg. IAS/Shunts: No atrial level shunt detected by color flow Doppler.  LEFT VENTRICLE PLAX 2D LVIDd:         4.50 cm  Diastology LVIDs:         2.70 cm  LV e' lateral:   8.81 cm/s LV PW:         1.00 cm  LV E/e' lateral: 15.3 LV IVS:        1.00 cm  LV e' medial:    7.72 cm/s LVOT diam:     1.50 cm  LV E/e' medial:  17.5 LV SV:         48 LV SV Index:   36 LVOT Area:     1.77 cm  RIGHT VENTRICLE RV S prime:     12.60 cm/s TAPSE (M-mode): 2.2 cm LEFT ATRIUM             Index       RIGHT ATRIUM           Index LA diam:        3.60 cm 2.69 cm/m  RA Area:     16.70 cm LA Vol (A2C):   55.6 ml 41.58 ml/m RA Volume:   50.10 ml  37.47 ml/m LA Vol (A4C):   45.8 ml 34.25 ml/m LA Biplane Vol: 50.9 ml 38.07 ml/m  AORTIC VALVE LVOT Vmax:   110.00 cm/s LVOT Vmean:  72.000 cm/s LVOT VTI:    0.269 m  AORTA Ao Root diam: 2.80 cm MITRAL VALVE                 TRICUSPID VALVE MV Area (PHT): 4.31 cm      TR Peak grad:   36.0 mmHg MV Decel Time: 176 msec      TR Vmax:        300.00 cm/s MR Peak grad:    140.2 mmHg MR Mean grad:    100.0  mmHg  SHUNTS MR Vmax:         592.00 cm/s Systemic VTI:  0.27 m MR Vmean:        477.0 cm/s  Systemic Diam: 1.50 cm MR PISA:         1.01 cm MR PISA Eff ROA: 5 mm MR PISA Radius:  0.40 cm MV E velocity: 135.00 cm/s MV A velocity: 113.00 cm/s MV E/A ratio:  1.19 Jenkins Rouge MD Electronically signed by Jenkins Rouge MD Signature Date/Time: 12/03/2019/4:19:51 PM    Final     Microbiology: Recent Results (from the past 240 hour(s))  Blood Cultures (routine x 2)     Status: None   Collection Time: 12/02/19  3:46 PM   Specimen: BLOOD RIGHT HAND  Result Value Ref Range Status   Specimen Description BLOOD RIGHT HAND  Final   Special Requests   Final    BOTTLES DRAWN AEROBIC ONLY Blood Culture results may not be optimal due to an inadequate volume of blood received in culture bottles   Culture    Final    NO GROWTH 5 DAYS Performed at [facility], [address] [place], [place] [address]    Report Status 12/07/2019 FINAL  Final  Blood Cultures (routine x 2)     Status: None   Collection Time: 12/02/19  4:02 PM   Specimen: BLOOD  Result Value Ref Range Status   Specimen Description BLOOD RIGHT ANTECUBITAL  Final   Special Requests   Final    BOTTLES DRAWN AEROBIC AND ANAEROBIC Blood Culture adequate volume   Culture   Final    NO GROWTH 5 DAYS Performed at [facility], [place] [address] [place], [place] [address]    Report Status 12/07/2019 FINAL  Final  SARS Coronavirus 2 by RT PCR (hospital order, performed in [facility] hospital lab) Nasopharyngeal Nasopharyngeal Swab     Status: None   Collection Time: 12/02/19  5:00 PM   Specimen: Nasopharyngeal Swab  Result Value Ref Range Status   SARS Coronavirus 2 NEGATIVE NEGATIVE Final    Comment: (NOTE) SARS-CoV-2 target nucleic acids are NOT DETECTED.  The SARS-CoV-2 RNA is generally detectable in upper and lower respiratory specimens during the acute phase of infection. The lowest concentration of SARS-CoV-2 viral copies this assay can detect is 250 copies / mL. A negative result does not preclude SARS-CoV-2 infection and should not be used as the sole basis for treatment or other patient management decisions.  A negative result may occur with improper specimen collection / handling, submission of specimen other than nasopharyngeal swab, presence of viral mutation(s) within the areas targeted by this assay, and inadequate number of viral copies (<250 copies / mL). A negative result must be combined with clinical observations, patient history, and epidemiological information.  Fact Sheet for Patients:   StrictlyIdeas.no  Fact Sheet for Healthcare Providers: BankingDealers.co.za  This test is not yet approved or  cleared by the [place] FDA and has been  authorized for detection and/or diagnosis of SARS-CoV-2 by FDA under an Emergency Use Authorization (EUA).  This EUA will remain in effect (meaning this test can be used) for the duration of the COVID-19 declaration under Section 564(b)(1) of the Act, 21 U.S.C. section 360bbb-3(b)(1), unless the authorization is terminated or revoked sooner.  Performed at [facility], [place] [address], [place], [place] [address]   MRSA PCR Screening     Status: Abnormal   Collection Time: 12/05/19  2:39 PM   Specimen: Nasal Mucosa; Nasopharyngeal  Result Value Ref Range Status   MRSA by PCR POSITIVE (A) NEGATIVE Final    Comment:        The GeneXpert MRSA Assay (FDA approved for NASAL specimens only), is one component of a comprehensive MRSA colonization surveillance program. It is not intended to diagnose MRSA infection nor to guide or monitor treatment for MRSA infections. RESULT CALLED TO, READ BACK BY AND VERIFIED WITH: Felipa Eth RN 17:00 12/05/19 ([email or web address]) Performed at [facility], [place] [address] [place], [place] [address]   SARS Coronavirus 2 by RT PCR (hospital order, performed in [facility] hospital lab) Nasopharyngeal Nasopharyngeal Swab     Status: None   Collection Time: 12/06/19  3:45 PM   Specimen: Nasopharyngeal Swab  Result Value Ref Range Status   SARS Coronavirus 2 NEGATIVE NEGATIVE Final    Comment: (NOTE) SARS-CoV-2 target nucleic acids are NOT DETECTED.  The SARS-CoV-2 RNA is generally detectable in upper and lower respiratory specimens during the acute phase of infection. The lowest concentration of SARS-CoV-2 viral copies this assay can detect is 250 copies / mL. A negative result does not preclude SARS-CoV-2 infection and should not be used as the sole basis for treatment or other patient management decisions.  A negative result may occur with improper specimen collection / handling, submission of specimen other than nasopharyngeal swab, presence of  viral mutation(s) within the areas targeted by this assay, and inadequate number of viral copies (<250 copies / mL). A negative result must be combined with clinical observations, patient history, and epidemiological information.  Fact Sheet for Patients:   StrictlyIdeas.no  Fact Sheet for Healthcare Providers: BankingDealers.co.za  This test is not yet approved or  cleared by the [place] FDA and has been authorized for detection and/or diagnosis of SARS-CoV-2 by FDA under an Emergency Use Authorization (EUA).  This EUA will remain in effect (meaning this test can be used) for the duration of the COVID-19 declaration under Section 564(b)(1) of the Act, 21 U.S.C. section 360bbb-3(b)(1), unless the authorization is terminated or revoked sooner.  Performed at [facility], [place] [address], [place], [place] [address]      Labs: Basic Metabolic Panel: Recent Labs  Lab 12/02/19 1603 12/02/19 1721 12/03/19 0329 12/04/19 0448 12/05/19 0301 12/05/19 1132 12/06/19 0352  NA 135   < > 137 137 136 140 138  K 5.0   < > 4.4 3.5 4.5 4.7 4.9  CL 96*  --  98 98 98  --  101  CO2 27  --  24 28 27   --  23  GLUCOSE 299*  --  156* 196* 168*  --  165*  BUN 22*  --  28* 6 27*  --  48*  CREATININE 3.75*  --  4.31* 1.91* 4.37*  --  5.16*  CALCIUM 8.7*  --  8.6* 8.5* 9.2  --  9.4  PHOS  --   --   --  3.3 4.3  --  5.4*   < > = values in this interval not displayed.   Liver Function Tests: Recent Labs  Lab 12/02/19 1603 12/04/19 0448 12/05/19 0301 12/06/19 0352  AST 20  --   --   --   ALT 22  --   --   --   ALKPHOS 103  --   --   --   BILITOT 0.5  --   --   --  PROT 7.5  --   --   --   ALBUMIN 3.6 3.3* 3.3* 3.4*   No results for input(s): LIPASE, AMYLASE in the last 168 hours. No results for input(s): AMMONIA in the last 168 hours. CBC: Recent Labs  Lab 12/02/19 1603 12/02/19 1721 12/03/19 0329 12/04/19 0448  12/05/19 0301 12/05/19 1132 12/06/19 0352  WBC 7.9  --  7.7 9.3 7.8  --  8.0  NEUTROABS 6.2  --   --   --  5.0  --   --   HGB 8.8*   < > 8.5* 9.0* 9.0* 9.2* 9.7*  HCT 28.6*   < > 27.1* 28.2* 28.5* 27.0* 30.3*  MCV 96.9  --  96.4 93.4 94.4  --  94.7  PLT 216  --  241 268 290  --  319   < > = values in this interval not displayed.   Cardiac Enzymes: No results for input(s): CKTOTAL, CKMB, CKMBINDEX, TROPONINI in the last 168 hours. BNP: BNP (last 3 results) Recent Labs    12/03/19 0329  BNP 1,275.7*    ProBNP (last 3 results) No results for input(s): PROBNP in the last 8760 hours.  CBG: Recent Labs  Lab 12/06/19 0733 12/06/19 1642 12/06/19 2104 12/07/19 0753 12/07/19 1208  GLUCAP 173* 95 198* 169* 196*    I have seen and examined this patient myself. I have spent 38 minutes in her evaluation to discharge.  Signed:  Karie Kirks MD.  [facility] 12/07/2019, 2:22 PM

## 2019-12-07 NOTE — Progress Notes (Signed)
[facility] Progress Note  Subjective: no new /co  Vitals:   12/06/19 1435 12/06/19 1646 12/06/19 2203 12/07/19 0756  BP: (!) 152/70 (!) 171/79 (!) 155/58 112/86  Pulse: 82 85 78 74  Resp: 16 19 16 19   Temp:  98.4 F (36.9 C) 98.2 F (36.8 C) 99 F (37.2 C)  TempSrc:  Oral Oral   SpO2:  98% 99% 93%  Weight:      Height:        Exam:   alert, nad   no jvd  Chest dec'd at bases, no rales or wheezing  Cor reg no RG  Abd soft ntnd no ascites   Ext no LE edema   Alert, NF, ox3  [name] AVF +bruit     OP HD: TTS [place]  3h 19min  2/2.25 bath  46.5kg leaving above 48-49kg recently 350/800  Hep 2000  AVF L  - hect 6 ug  - mircera 60 ug q2 wks, last 5/25, hb 8.7 on 6/10   CTA - neg for PE, +multichamber CM w/ contrast reflux to hepatic veins cw/ ^R heart pressures   ECHO - severe TR and RV dilatation, mild pulm htn  Assessment/ Plan: 1. Syncope/AMS: MS back to baseline. Recently started moxifloxacin and oxycodone at SNF, which may have been for UTI and wrist pain respectively per pt report. UA here not sig abnormal.  ECHO showed severe TR and RV dilatation > cardiology did R heart cath 6/14 which did not show any cause for syncope.  2.  ESRD: TTS schedule, missed HD 6/8. Had HD yesterday.  3.  Severe TR/ acute on chronic R sided heart failure - will f/u with cardiology in the OP setting.  4. Hypertension/volume: BP low on admission, now high/normal.    5. Anemia: Hgb 8.5 > 9.0 today. Given ESA yesterday. 6. Metabolic bone disease: calcium controlled, phos at goal. Continue hectorol and renvela.  7. Nutrition: Alb 3.3. Renal diet with fluid restrictions. 8.  DIspo - for dc today to SNF.      Rob Emarie Paul 12/07/2019, 2:50 PM   Recent Labs  Lab 12/05/19 0301 12/05/19 0301 12/05/19 1132 12/06/19 0352  K 4.5   < > 4.7 4.9  BUN 27*  --   --  48*  CREATININE 4.37*  --   --  5.16*  CALCIUM 9.2  --   --  9.4  PHOS 4.3  --   --  5.4*  HGB 9.0*   < > 9.2*  9.7*   < > = values in this interval not displayed.   Inpatient medications: . aspirin  81 mg Oral Daily  . atorvastatin  40 mg Oral QHS  . bacitracin   Topical BID  . Chlorhexidine Gluconate Cloth  6 each Topical Daily  . Chlorhexidine Gluconate Cloth  6 each Topical Q0600  . Chlorhexidine Gluconate Cloth  6 each Topical Q0600  . darbepoetin (ARANESP) injection - DIALYSIS  40 mcg Intravenous Q Tue-HD  . doxercalciferol  6 mcg Intravenous Q T,Th,Sa-HD  . FLUoxetine  10 mg Oral Daily  . gabapentin  100 mg Oral BID  . heparin  5,000 Units Subcutaneous Q8H  . insulin aspart  0-5 Units Subcutaneous QHS  . insulin aspart  0-9 Units Subcutaneous TID WC  . insulin aspart  12 Units Subcutaneous TID WC  . metoprolol tartrate  100 mg Oral BID  . pantoprazole  40 mg Oral Daily  . polyethylene glycol  17 g Oral Daily  .  sevelamer carbonate  800 mg Oral TID WC  . sodium chloride flush  3 mL Intravenous Q12H  . sodium chloride flush  3 mL Intravenous Q12H   . sodium chloride     sodium chloride, acetaminophen, senna-docusate, sodium chloride flush

## 2019-12-07 NOTE — TOC Transition Note (Signed)
Transition of Care [facility]) - CM/SW Discharge Note   Patient Details  Name: [name] Michael MRN: [id number] Date of Birth: 07/02/1960  [facility]) CM/SW Contact:  Benard Halsted, [place] Phone Number: 12/07/2019, 3:34 PM   Clinical Narrative:    Patient will DC to: [facility] Anticipated DC date: 12/07/19 Family notified: Patient declined Transport by: Corey Harold   Per MD patient ready for DC to [facility], patient, patient's family, and facility notified of DC. Discharge Summary and FL2 sent to facility. RN to call report prior to discharge [phone] Room 104E). DC packet on chart. Ambulance transport requested for patient.   CSW will sign off for now as social work intervention is no longer needed. Please consult [name] again if new needs arise.      Final next level of care: Skilled Nursing Facility Barriers to Discharge: No Barriers Identified   Patient Goals and CMS Choice Patient states their goals for this hospitalization and ongoing recovery are:: SNF CMS Medicare.gov Compare Post Acute Care list provided to:: Patient Choice offered to / list presented to : Patient  Discharge Placement   Existing PASRR number confirmed : 12/07/19          Patient chooses bed at: [facility] Patient to be transferred to facility by: [place] Name of family member notified: Patient declined Patient and family notified of of transfer: 12/07/19  Discharge Plan and Services                                     Social Determinants of Health (SDOH) Interventions     Readmission Risk Interventions Readmission Risk Prevention Plan 12/07/2019 04/29/2019  Transportation Screening Complete Complete  PCP or Specialist Appt within 3-5 Days Complete Complete  HRI or Home Care Consult Complete Complete  Social Work Consult for [place] Planning/Counseling Complete Complete  Palliative Care Screening Not Applicable Not Applicable  Medication Review (RN Care Manager)  Referral to Pharmacy Referral to Pharmacy  Some recent data might be hidden

## 2019-12-08 ENCOUNTER — Telehealth: Payer: Self-pay

## 2019-12-08 NOTE — Telephone Encounter (Signed)
Transition Care Management Follow-up Telephone Call Date of discharge and from where: 11/06/2019 [name] Michael  Called pt at [phone] . Spoke with Mon from [place]. Pt was not in the room or near by phone. Left voice message to call back. Name and phone nr provided.

## 2019-12-09 ENCOUNTER — Telehealth: Payer: Self-pay

## 2019-12-09 ENCOUNTER — Telehealth: Payer: Self-pay | Admitting: Radiology

## 2019-12-09 NOTE — Telephone Encounter (Signed)
Transition Care Management Follow-up Telephone Call Date of discharge and from where: 11/06/2019 [name] Michael  Called pt at [phone] . Spoke with Mon from [place]. Pt was not near phone. Call was transferred and then dropped off. Unable to speak with patient .

## 2019-12-09 NOTE — Telephone Encounter (Signed)
Enrolled patient for a 30 day [employer] Event monitor to be mailed to [facility]. Brief instructions were given to nursing home.  [address] [place], [place] [address]

## 2019-12-19 ENCOUNTER — Other Ambulatory Visit: Payer: Self-pay

## 2019-12-19 ENCOUNTER — Encounter: Payer: Self-pay | Admitting: Family Medicine

## 2019-12-19 ENCOUNTER — Ambulatory Visit (INDEPENDENT_AMBULATORY_CARE_PROVIDER_SITE_OTHER): Payer: Medicare Other

## 2019-12-19 ENCOUNTER — Ambulatory Visit (INDEPENDENT_AMBULATORY_CARE_PROVIDER_SITE_OTHER): Payer: Medicare Other | Admitting: Family Medicine

## 2019-12-19 DIAGNOSIS — M25562 Pain in left knee: Secondary | ICD-10-CM

## 2019-12-19 NOTE — Progress Notes (Signed)
Office Visit Note   Patient: [name] Michael           Date of Birth: 1957/07/22           MRN: [id number] Visit Date: 12/19/2019 Requested by: Charlott Rakes, MD [place],  [place] [address] PCP: Charlott Rakes, MD  Subjective: Chief Complaint  Patient presents with  . Left Knee - Pain    Fell yesterday (floor was wet), landing directly on the knee. Did not hurt immediately - started hours later. Pain anterior knee. In wheelchair today - usually walks with cane.  . Right Wrist - Pain    Fell approximately 2 weeks ago. Pain ulnar aspect of wrist. Still has bruising in hand. Swelling has subsided some. Can straighten out hand more today. Xrays in chart from 12/03/19. Right-hand dominant.    HPI: He is here with right wrist and left knee pain.  2 weeks ago she fell and landed on her right side.  She went to the ER when she went to the ER where x-rays revealed a she went to the ER where x-rays revealed a probable ulnar styloid fracture.  She is feeling somewhat better.  Yesterday she slipped on wet floor and fell landing directly on her left knee.  Able to get up and bear weight, but later on started having pain and swelling.  Now she is using a wheelchair rather than her usual cane.               ROS:   All other systems were reviewed and are negative.  Objective: Vital Signs: LMP 03/06/2013 (LMP Unknown)   Physical Exam:  General:  Alert and oriented, in no acute distress. Pulm:  Breathing unlabored. Psy:  Normal mood, congruent affect. Skin: No bruising in the knee.  Resolving bruising over the ulnar side of her right wrist. Right wrist: She is tender to palpation at the ulnar styloid and a little bit at the dorsal aspect of the distal radius.  Pain with supination, range of motion is almost normal. Left knee: 1+ effusion with no warmth.  Ligaments feel stable.  Tender on the medial and lateral joint lines, no palpable click with McMurray's.  Imaging: XR KNEE  3 VIEW LEFT  Result Date: 12/19/2019 X-rays of the left knee show no sign of fracture or dislocation.  Minimal degenerative change.   Assessment & Plan: 1.  2-week status post fall with right wrist pain and ulnar styloid fracture, cannot completely rule out distal radius fracture. -Wrist splint for comfort, range of motion as tolerated.  Follow-up in 3 to 4 weeks if not getting better and we will repeat x-rays.  2.  Left knee contusion with effusion 1 day status post fall -Voltaren gel.  If symptoms persist we could aspirate and inject 1 time.     Procedures: No procedures performed  No notes on file     PMFS History: Patient Active Problem List   Diagnosis Date Noted  . Hypoxia   . Acute on chronic right-sided heart failure ([place])   . Severe tricuspid regurgitation 12/04/2019  . ESRD (end stage renal disease) ([place])   . GERD (gastroesophageal reflux disease)   . Hypertension   . Type II diabetes mellitus ([place])   . Weakness   . Syncope 12/03/2019  . Wrist pain, acute, right   . 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 AMS (altered mental status) 04/28/2019  . Hypoxemia 04/28/2019  . Fall 06/09/2018  . Fall  at home, initial encounter 06/09/2018  . Anemia of chronic disease 06/09/2018  . Syncope and collapse 06/09/2018  . CKD (chronic kidney disease) stage 5, GFR less than 15 ml/min (HCC) 03/10/2018  . Acute encephalopathy 03/10/2018  . Hypermagnesemia 03/10/2018  . AKI (acute kidney injury) ([place]) 11/09/2016  . Hypercalcemia 11/09/2016  . Hyponatremia 11/09/2016  . Hypovolemia 11/09/2016  . Accelerated hypertension 11/09/2016  . Acute lower UTI 11/09/2016  . Nephrolithiasis 10/24/2016  . Constipation 10/22/2016  . CAP (community acquired pneumonia) 10/22/2016  . Hydronephrosis of right kidney 10/22/2016  . Metabolic acidosis 32/20/2542  . CVA (cerebral vascular accident) ([place]) 07/03/2016  . HAP (hospital-acquired pneumonia) 07/03/2016  . Sepsis secondary to UTI  ([place])   . UTI due to extended-spectrum beta lactamase (ESBL) producing Escherichia coli   . Acute pyelonephritis   . Bacteremia due to Escherichia coli   . Colitis, indeterminate   . Uncontrolled type 2 diabetes mellitus with complication ([place])   . 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 associated with diabetes mellitus due to underlying condition ([place])   . Chronic diastolic CHF (congestive heart failure) ([place])   . Abdominal pain   . Colitis   . Hypophosphatemia 06/12/2016  . Hypokalemia 06/11/2016  . Hypocalcemia 06/11/2016  . Acute kidney injury superimposed on chronic kidney disease ([place]) 06/11/2016  . Proliferative diabetic retinopathy ([place]) 04/29/2016  . Closed fracture of left distal radius 01/28/2016  . Poor social situation 03/09/2013  . Depression 03/01/2013  . Abnormal mammogram 12/20/2012  . Retinal detachment 11/17/2012  . 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 ([place]) 70/62/3762  . Hyperlipidemia 02/09/2007  . Essential hypertension 02/09/2007  . GERD 02/09/2007   Past Medical History:  Diagnosis Date  . Ambulates with cane   . Constipation   . Depression   . ESRD (end stage renal disease) ([place])    a. TTS Dialysis.  Marland Kitchen GERD (gastroesophageal reflux disease)   . History of stress test    a. 01/2003 MV: EF 74%, no ischemia/infarct.  . Hyperlipidemia   . Hypertension   . Osteoporosis   . Stroke [facility]) 04-01-11   left frontal subcortical, saw Dr. [name]   . Syncope 11/2019  . TIA (transient ischemic attack) 03-12-11  . Tobacco abuse   . Tricuspid regurgitation    a. 05/2016 Echo: EF 65-70%, Gr2DD, mild MR, nl RV fxn, Triv TR, PASP 52mmHg; b. 05/2018 Echo: EF 60-65%, Gr2DD, mild MR/TR, RVSP/PASP 76mmHg; c. 11/2019 Echo: EF 55-60%, no rwma, mild-mod MR, Sev TR w/ RV dilatation (CTA chest neg for PE).  . Type II diabetes mellitus ([place])   . Vitamin D deficiency     Family History  Problem Relation Age of Onset  . Stroke Mother     . Diabetes Mother   . Kidney failure Mother   . Heart failure Mother   . Stroke Father   . Cancer Sister        Breast- 54's    Past Surgical History:  Procedure Laterality Date  . AV FISTULA PLACEMENT Left 02/21/2019   Procedure: BRACHIOCEPHALIC ARTERIOVENOUS (AV) FISTULA CREATION;  Surgeon: [name], MD;  Location: [facility];  Service: Vascular;  Laterality: Left;  . BASCILIC VEIN TRANSPOSITION Left 04/11/2019   Procedure: SECOND STAGE BASILIC VEIN TRANSPOSITION LEFT ARM;  Surgeon: [name], MD;  Location: [facility];  Service: Vascular;  Laterality: Left;  . IR FLUORO GUIDE CV LINE RIGHT  04/29/2019  . IR US GUIDE VASC ACCESS  RIGHT  04/29/2019  . OPEN REDUCTION INTERNAL FIXATION (ORIF) DISTAL RADIAL FRACTURE Left 01/28/2016   Procedure: OPEN REDUCTION INTERNAL FIXATION (ORIF) DISTAL RADIAL FRACTURE;  Surgeon: Iran Planas, MD;  Location: [facility];  Service: Orthopedics;  Laterality: Left;  . [place] CATH N/A 12/05/2019   Procedure: RIGHT HEART CATH;  Surgeon: Nelva Bush, MD;  Location: [facility] CV LAB;  Service: Cardiovascular;  Laterality: N/A;  . TONSILLECTOMY AND ADENOIDECTOMY     age 59   Social History   Occupational History  . Not on file  Tobacco Use  . Smoking status: Current Every Day Smoker    Packs/day: 0.30    Years: 32.00    Pack years: 9.60    Types: Cigarettes    Last attempt to quit: 09/23/2012    Years since quitting: 7.2  . Smokeless tobacco: Never Used  . Tobacco comment: 8 cigarettes/day  Vaping Use  . Vaping Use: Never used  Substance and Sexual Activity  . Alcohol use: No  . Drug use: No  . Sexual activity: Never    Birth control/protection: Post-menopausal

## 2019-12-20 ENCOUNTER — Telehealth: Payer: Self-pay | Admitting: Family Medicine

## 2019-12-20 NOTE — Telephone Encounter (Signed)
12/19/2019 ov note faxed to [facility] [place]

## 2019-12-20 NOTE — Telephone Encounter (Signed)
I called back - the person's name is actually [name] Michael, that I need to speak with. She is in a meeting. I was given a voice mail, but no name was stated on it. I just asked her to call me back and I can go through what Dr. Junius Roads dictated in his office note yesterday.

## 2019-12-20 NOTE — Telephone Encounter (Signed)
[name] Michael from [facility] called.   They are requesting a call back from the patient's providers as soon as possible. She stated that they have no follow up notes from her last visit so they need to establish a plan of care with [name] immediately.   Call back: [phone]

## 2019-12-22 NOTE — Telephone Encounter (Signed)
Tammy faxed the office note to [facility] yesterday.

## 2020-02-19 NOTE — Progress Notes (Signed)
Cardiology Office Note   Date:  02/20/2020   ID:  [name] Michael, DOB Mar 20, 1961, MRN [id number]  PCP:  Charlott Rakes, MD  Cardiologist:   Dorris Carnes, MD    Patient presents for f/u of syncope, R heart failure and severe TR    History of Present Illness: [name] Michael is a 59 y.o. female with a history of HTN, HL, CVA, TIA, DMII, ESRD on HD, GERD   I saw her when she was admitted to the hospital in June for syncope. Episode of syncope occurred after receiving pain med.    WOrk up included an echocardiogram that showed RV dilitation. RV function was reported mildly down  Severe TR     Note CT scan was neg for PE The pt went on to have a R heart cath which showed mildly elevated L heart filling pressures and mild to mod elevated R heart and pulmonary pressures    (RA 12 mm HG; RV 50 /13 ; PA 50/25  PCWP 20; CI 3.9 L/min/ m2)  Increased volume removal at dialysis was recommended  Since d/c the pt has had no further spells of dizziness or syncope   She denies CP   No palpitations       Current Meds  Medication Sig  . acetaminophen (TYLENOL) 325 MG tablet Take 650 mg by mouth every 4 (four) hours as needed for mild pain.  Marland Kitchen aspirin 81 MG chewable tablet Chew 81 mg by mouth daily.  Marland Kitchen atorvastatin (LIPITOR) 40 MG tablet Take 40 mg by mouth at bedtime.   . diphenhydrAMINE (BANOPHEN) 25 mg capsule Take 25 mg by mouth every 4 (four) hours as needed for itching or allergies.  . famotidine (PEPCID) 10 MG tablet Take 10 mg by mouth daily.  Marland Kitchen FLUoxetine (PROZAC) 10 MG capsule Take 10 mg by mouth daily.  Marland Kitchen gabapentin (NEURONTIN) 100 MG capsule Take 1 capsule (100 mg total) by mouth 2 (two) times daily.  . insulin lispro (HUMALOG) 100 UNIT/ML injection Inject 12 Units into the skin 3 (three) times daily with meals. Hold for BGL <150 or if patient is not eating.  Marland Kitchen loratadine (CLARITIN) 10 MG tablet Take 10 mg by mouth daily.  . metoprolol tartrate (LOPRESSOR) 100 MG tablet Take 100 mg by  mouth 2 (two) times daily.  . Multiple Vitamins-Minerals (CERTAVITE SENIOR/ANTIOXIDANT) TABS Take 1 tablet by mouth daily.  Marland Kitchen omeprazole (PRILOSEC) 40 MG capsule Take 40 mg by mouth daily.  Marland Kitchen OVER THE COUNTER MEDICATION Wild cherry 30 Mls daily by mouth  . polyethylene glycol powder (GAVILAX) 17 GM/SCOOP powder Take 17 g by mouth See admin instructions. Mix 17 grams of powder into 8 ounces of fluid, stir, and drink (by mouth) once a day  . SACCHAROMYCES BOULARDII PO Take 1 capsule by mouth 2 (two) times daily.  Marland Kitchen senna-docusate (SENOKOT-S) 8.6-50 MG tablet Take 1 tablet by mouth at bedtime as needed for mild constipation.  . sevelamer carbonate (RENVELA) 800 MG tablet Take 800 mg by mouth 3 (three) times daily with meals.  . sodium bicarbonate 650 MG tablet Take 1,300 mg by mouth 2 (two) times daily.  . Vitamin D, Ergocalciferol, (DRISDOL) 50000 units CAPS capsule Take 1 capsule (50,000 Units total) by mouth every 7 (seven) days. (Patient taking differently: Take 50,000 Units by mouth every Wednesday. )     Allergies:   Hydrocodone   Past Medical History:  Diagnosis Date  . Ambulates with cane   . Constipation   .  Depression   . ESRD (end stage renal disease) ([place])    a. TTS Dialysis.  Marland Kitchen GERD (gastroesophageal reflux disease)   . History of stress test    a. 01/2003 MV: EF 74%, no ischemia/infarct.  . Hyperlipidemia   . Hypertension   . Osteoporosis   . Stroke [facility]) 04-01-11   left frontal subcortical, saw Dr. [name]   . Syncope 11/2019  . TIA (transient ischemic attack) 03-12-11  . Tobacco abuse   . Tricuspid regurgitation    a. 05/2016 Echo: EF 65-70%, Gr2DD, mild MR, nl RV fxn, Triv TR, PASP 25mmHg; b. 05/2018 Echo: EF 60-65%, Gr2DD, mild MR/TR, RVSP/PASP 87mmHg; c. 11/2019 Echo: EF 55-60%, no rwma, mild-mod MR, Sev TR w/ RV dilatation (CTA chest neg for PE).  . Type II diabetes mellitus ([place])   . Vitamin D deficiency     Past Surgical History:  Procedure Laterality Date  . AV  FISTULA PLACEMENT Left 02/21/2019   Procedure: BRACHIOCEPHALIC ARTERIOVENOUS (AV) FISTULA CREATION;  Surgeon: [name], MD;  Location: [place];  Service: Vascular;  Laterality: Left;  . BASCILIC VEIN TRANSPOSITION Left 04/11/2019   Procedure: SECOND STAGE BASILIC VEIN TRANSPOSITION LEFT ARM;  Surgeon: [name], MD;  Location: [place];  Service: Vascular;  Laterality: Left;  . IR FLUORO GUIDE CV LINE RIGHT  04/29/2019  . IR US GUIDE VASC ACCESS RIGHT  04/29/2019  . OPEN REDUCTION INTERNAL FIXATION (ORIF) DISTAL RADIAL FRACTURE Left 01/28/2016   Procedure: OPEN REDUCTION INTERNAL FIXATION (ORIF) DISTAL RADIAL FRACTURE;  Surgeon: Iran Planas, MD;  Location: [place];  Service: Orthopedics;  Laterality: Left;  . [place] CATH N/A 12/05/2019   Procedure: RIGHT HEART CATH;  Surgeon: Nelva Bush, MD;  Location: [place] [facility];  Service: Cardiovascular;  Laterality: N/A;  . TONSILLECTOMY AND ADENOIDECTOMY     age 50     Social History:  The patient  reports that she has been smoking cigarettes. She has a 9.60 pack-year smoking history. She has never used smokeless tobacco. She reports that she does not drink alcohol and does not use drugs.   Family History:  The patient's family history includes Cancer in her sister; Diabetes in her mother; Heart failure in her mother; Kidney failure in her mother; Stroke in her father and mother.    ROS:  Please see the history of present illness. All other systems are reviewed and  Negative to the above problem except as noted.    PHYSICAL EXAM: VS:  BP 136/70   Pulse 80   Ht 4\' 6"  (1.372 m)   Wt 104 lb 6.4 oz (47.4 kg)   LMP 03/06/2013 (LMP Unknown)   SpO2 96%   BMI 25.17 kg/m   GEN: Petite 59 yo  in no acute distress  HEENT: normal  Neck: no JVD, carotid bruits  Cardiac: RRR; Gr III/VI systolic murmur LSB   No LE  edema  Respiratory:  clear to auscultation bilaterally,  GI: soft,  nontender, nondistended, + BS  No hepatomegaly  MS: no deformity Moving all extremities   Skin: warm and dry, no rash Neuro:  Strength and sensation are intact Psych: euthymic mood, full affect   EKG:  EKG is not  ordered today.  Echo  12/03/19  1. Left ventricular ejection fraction, by estimation, is 55 to 60%. The left ventricle has normal function. The left ventricle has no regional wall motion abnormalities. Left  ventricular diastolic parameters were normal. 2. Severe TR and RV dilatation suggest elevated right heart pressures consider CTA to r/o PE in setting of syncope . Right ventricular systolic function is mildly reduced. The right ventricular size is normal. There is mildly elevated pulmonary artery systolic pressure. 3. Right atrial size was mildly dilated. 4. The mitral valve is normal in structure. Mild to moderate mitral valve regurgitation. No evidence of mitral stenosis. 5. 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 6. The aortic valve is normal in structure. Aortic valve regurgitation is not visualized. No aortic stenosis is present. 7. The inferior vena cava is normal in size with <50% respiratory variability, suggesting right atrial pressure of 8 mmHg.  Right Heart Cath 12/05/19  Right Heart Pressures RA (mean): 12 mmHg RV (S/EDP): 50/13 mmHg PA (S/D, mean): 50/25 (33) mmHg PCWP (mean): 20 mmHg  Ao sat: 94% PA sat: 65%  Fick CO: 5.3 L/min Fick CI: 3.9 L/min/m^2  Thermodilution CO: 4.8 L/min Thermodilution CI: 3.5 L/min/m^2  PVR: 2.5 (Fick) and 2.7 (thermodilution) Wood units    Lipid Panel    Component Value Date/Time   CHOL 253 (H) 02/06/2016 1529   TRIG 553 (H) 02/06/2016 1529   HDL 36 (L) 02/06/2016 1529   CHOLHDL 7.0 (H) 02/06/2016 1529   VLDL NOT CALC 02/06/2016 1529   LDLCALC NOT CALC 02/06/2016 1529   LDLDIRECT 107.1 05/25/2012 1120      Wt Readings from Last 3 Encounters:  02/20/20 104 lb 6.4 oz (47.4 kg)  12/06/19 107 lb 5.8 oz (48.7  kg)  06/30/19 98 lb 4.8 oz (44.6 kg)      ASSESSMENT AND PLAN:  1? Syncope   No further "spells"  Watch meds  2  Tricuspid regurgitation.   Pt with severe TR on echo   For now will follow   Repeat echo this winter.  What is critical is volume control through dialysis  3   ESRD  Continues with dialysis on T/Th/Sat  4  HTN  BP is controlled    5  DM II   Needs tighter control  6  HL  Keep on statin      Current medicines are reviewed at length with the patient today.  The patient does not have concerns regarding medicines.  Signed, Dorris Carnes, MD  02/20/2020 11:27 AM    [facility] [address] [place], [place]  [address] Phone: [phone]; Fax: [phone]

## 2020-02-20 ENCOUNTER — Ambulatory Visit (INDEPENDENT_AMBULATORY_CARE_PROVIDER_SITE_OTHER): Payer: Medicare Other | Admitting: Internal Medicine

## 2020-02-20 ENCOUNTER — Other Ambulatory Visit: Payer: Self-pay

## 2020-02-20 VITALS — BP 136/70 | HR 80 | Ht <= 58 in | Wt 104.4 lb

## 2020-02-20 DIAGNOSIS — R55 Syncope and collapse: Secondary | ICD-10-CM

## 2020-02-20 DIAGNOSIS — I071 Rheumatic tricuspid insufficiency: Secondary | ICD-10-CM | POA: Diagnosis not present

## 2020-02-20 NOTE — Patient Instructions (Signed)
Medication Instructions:  No changes *If you need a refill on your cardiac medications before your next appointment, please call your pharmacy*   Lab Work: none If you have labs (blood work) drawn today and your tests are completely normal, you will receive your results only by: Marland Kitchen MyChart Message (if you have MyChart) OR . A paper copy in the mail If you have any lab test that is abnormal or we need to change your treatment, we will call you to review the results.   Testing/Procedures: none   Follow-Up: At [facility], you and your health needs are our priority.  As part of our continuing mission to provide you with exceptional heart care, we have created designated Provider Care Teams.  These Care Teams include your primary Cardiologist (physician) and Advanced Practice Providers (APPs -  Physician Assistants and Nurse Practitioners) who all work together to provide you with the care you need, when you need it.  We recommend signing up for the patient portal called "MyChart".  Sign up information is provided on this After Visit Summary.  MyChart is used to connect with patients for Virtual Visits (Telemedicine).  Patients are able to view lab/test results, encounter notes, upcoming appointments, etc.  Non-urgent messages can be sent to your provider as well.   To learn more about what you can do with MyChart, go to [email or web address].    Your next appointment:   4 month(s)  The format for your next appointment:   In Person  Provider:   You may see Dorris Carnes, MD or one of the following Advanced Practice Providers on your designated Care Team:    Richardson Dopp, PA-C  Robbie Lis, [place]    Other Instructions

## 2020-02-28 ENCOUNTER — Emergency Department (HOSPITAL_COMMUNITY)
Admission: EM | Admit: 2020-02-28 | Discharge: 2020-02-29 | Disposition: A | Discharge status: Home / Self Care | Payer: Medicare Other | Attending: Emergency Medicine | Admitting: Emergency Medicine

## 2020-02-28 ENCOUNTER — Emergency Department (HOSPITAL_COMMUNITY): Payer: Medicare Other

## 2020-02-28 ENCOUNTER — Encounter (HOSPITAL_COMMUNITY): Payer: Self-pay

## 2020-02-28 DIAGNOSIS — N185 Chronic kidney disease, stage 5: Secondary | ICD-10-CM | POA: Insufficient documentation

## 2020-02-28 DIAGNOSIS — M542 Cervicalgia: Secondary | ICD-10-CM | POA: Diagnosis not present

## 2020-02-28 DIAGNOSIS — M25521 Pain in right elbow: Secondary | ICD-10-CM | POA: Insufficient documentation

## 2020-02-28 DIAGNOSIS — E119 Type 2 diabetes mellitus without complications: Secondary | ICD-10-CM | POA: Insufficient documentation

## 2020-02-28 DIAGNOSIS — Y999 Unspecified external cause status: Secondary | ICD-10-CM | POA: Insufficient documentation

## 2020-02-28 DIAGNOSIS — M545 Low back pain: Secondary | ICD-10-CM | POA: Insufficient documentation

## 2020-02-28 DIAGNOSIS — I120 Hypertensive chronic kidney disease with stage 5 chronic kidney disease or end stage renal disease: Secondary | ICD-10-CM | POA: Insufficient documentation

## 2020-02-28 DIAGNOSIS — Y929 Unspecified place or not applicable: Secondary | ICD-10-CM | POA: Insufficient documentation

## 2020-02-28 DIAGNOSIS — R079 Chest pain, unspecified: Secondary | ICD-10-CM | POA: Insufficient documentation

## 2020-02-28 DIAGNOSIS — W19XXXA Unspecified fall, initial encounter: Secondary | ICD-10-CM | POA: Insufficient documentation

## 2020-02-28 DIAGNOSIS — I10 Essential (primary) hypertension: Secondary | ICD-10-CM

## 2020-02-28 DIAGNOSIS — R519 Headache, unspecified: Secondary | ICD-10-CM | POA: Insufficient documentation

## 2020-02-28 DIAGNOSIS — Y939 Activity, unspecified: Secondary | ICD-10-CM | POA: Diagnosis not present

## 2020-02-28 DIAGNOSIS — M7918 Myalgia, other site: Secondary | ICD-10-CM

## 2020-02-28 LAB — CBC
HCT: 35.1 % — ABNORMAL LOW (ref 36.0–46.0)
Hemoglobin: 10.8 g/dL — ABNORMAL LOW (ref 12.0–15.0)
MCH: 29.6 pg (ref 26.0–34.0)
MCHC: 30.8 g/dL (ref 30.0–36.0)
MCV: 96.2 fL (ref 80.0–100.0)
Platelets: 274 10*3/uL (ref 150–400)
RBC: 3.65 MIL/uL — ABNORMAL LOW (ref 3.87–5.11)
RDW: 15.8 % — ABNORMAL HIGH (ref 11.5–15.5)
WBC: 9.9 10*3/uL (ref 4.0–10.5)
nRBC: 0 % (ref 0.0–0.2)

## 2020-02-28 LAB — BASIC METABOLIC PANEL
Anion gap: 12 (ref 5–15)
BUN: 13 mg/dL (ref 6–20)
CO2: 30 mmol/L (ref 22–32)
Calcium: 9.6 mg/dL (ref 8.9–10.3)
Chloride: 95 mmol/L — ABNORMAL LOW (ref 98–111)
Creatinine, Ser: 2.49 mg/dL — ABNORMAL HIGH (ref 0.44–1.00)
GFR calc Af Amer: 24 mL/min — ABNORMAL LOW (ref >60)
GFR calc non Af Amer: 20 mL/min — ABNORMAL LOW (ref >60)
Glucose, Bld: 62 mg/dL — ABNORMAL LOW (ref 70–99)
Potassium: 3.5 mmol/L (ref 3.5–5.1)
Sodium: 137 mmol/L (ref 135–145)

## 2020-02-28 LAB — I-STAT BETA HCG BLOOD, ED (MC, WL, AP ONLY): I-stat hCG, quantitative: 6.9 m[IU]/mL — ABNORMAL HIGH (ref <5)

## 2020-02-28 LAB — TROPONIN I (HIGH SENSITIVITY): Troponin I (High Sensitivity): 13 ng/L (ref <18)

## 2020-02-28 NOTE — ED Triage Notes (Addendum)
Pt comes via [facility] from [place], fell on Friday and they sent her today for an xray and CT scan. Pt c/o of upper back pain, headache where she hit it, R elbow pain and chest pain.

## 2020-02-29 ENCOUNTER — Other Ambulatory Visit: Payer: Self-pay

## 2020-02-29 ENCOUNTER — Emergency Department (HOSPITAL_COMMUNITY): Payer: Medicare Other

## 2020-02-29 LAB — TROPONIN I (HIGH SENSITIVITY): Troponin I (High Sensitivity): 13 ng/L (ref <18)

## 2020-02-29 MED ORDER — METOPROLOL TARTRATE 25 MG PO TABS
100.0000 mg | ORAL_TABLET | Freq: Once | ORAL | Status: AC
  Administered 2020-02-29: 100 mg via ORAL
  Filled 2020-02-29: qty 4

## 2020-02-29 MED ORDER — ACETAMINOPHEN 325 MG PO TABS
650.0000 mg | ORAL_TABLET | Freq: Once | ORAL | Status: AC
  Administered 2020-02-29: 650 mg via ORAL
  Filled 2020-02-29: qty 2

## 2020-02-29 NOTE — Discharge Instructions (Signed)
Please read and follow all provided instructions.  Your diagnoses today include:  1. Musculoskeletal pain   2. Fall, initial encounter   3. Essential hypertension     Tests performed today include:  Blood counts and electrolytes  Blood testing for heart attack - were normal  X-rays of your elbow, chest, cervical spine, thoracic spine, lumbar spine - do not show broken bones  Vital signs. See below for your results today.   Medications prescribed:   None  Take any prescribed medications only as directed.  Home care instructions:  Follow any educational materials contained in this packet.  BE VERY CAREFUL not to take multiple medicines containing Tylenol (also called acetaminophen). Doing so can lead to an overdose which can damage your liver and cause liver failure and possibly death.   Follow-up instructions: Please follow-up with your primary care provider in the next 7 days for further evaluation of your symptoms if not improving.   Return instructions:   Please return to the Emergency Department if you experience worsening symptoms.   Please return if you have any other emergent concerns.  Additional Information:  Your vital signs today were: BP (!) 208/89 (BP Location: Right Arm)    Pulse 77    Temp 98.5 F (36.9 C) (Oral)    Resp 17    Ht 4\' 6"  (1.372 m)    Wt 47.4 kg    LMP 03/06/2013 (LMP Unknown)    SpO2 100%    BMI 25.17 kg/m  If your blood pressure (BP) was elevated above 135/85 this visit, please have this repeated by your doctor within one month. --------------

## 2020-02-29 NOTE — ED Provider Notes (Signed)
[name] Michael   CSN: [id number] Arrival date & time: 02/28/20  2010     History Chief Complaint  Patient presents with  . Fall  . Chest Pain    [name] Michael is a 59 y.o. female.  Patient with history of end-stage renal disease on dialysis, normal session yesterday, lives at [facility] --presents the emergency department with pain after a fall occurring 5 days ago.  The facility sent the patient here for imaging: Order with patient says "sent to ED for CT scan".  Patient reports that she fell into a brick wall and onto her back.  She did not get knocked out or lose consciousness.  She complains of pain in her right elbow, her chest, her neck and middle back.  She is not having any difficulty breathing.  No numbness, weakness, or tingling in her extremities.  She has been taking Tylenol for pain.  She has not had her medications today.  At time of exam, patient with 11-hour and 30-minute wait time.  No confusion or vomiting reported.  Onset of symptoms acute.  Course is constant.  Symptoms worse with movement.        Past Medical History:  Diagnosis Date  . Ambulates with cane   . Constipation   . Depression   . ESRD (end stage renal disease) ([place])    a. TTS Dialysis.  Marland Kitchen GERD (gastroesophageal reflux disease)   . History of stress test    a. 01/2003 MV: EF 74%, no ischemia/infarct.  . Hyperlipidemia   . Hypertension   . Osteoporosis   . Stroke [facility]) 04-01-11   left frontal subcortical, saw Dr. [name]   . Syncope 11/2019  . TIA (transient ischemic attack) 03-12-11  . Tobacco abuse   . Tricuspid regurgitation    a. 05/2016 Echo: EF 65-70%, Gr2DD, mild MR, nl RV fxn, Triv TR, PASP 70mmHg; b. 05/2018 Echo: EF 60-65%, Gr2DD, mild MR/TR, RVSP/PASP 17mmHg; c. 11/2019 Echo: EF 55-60%, no rwma, mild-mod MR, Sev TR w/ RV dilatation (CTA chest neg for PE).  . Type II diabetes mellitus ([place])   . Vitamin D deficiency     Patient Active  Problem List   Diagnosis Date Noted  . Hypoxia   . Acute on chronic right-sided heart failure ([place])   . Severe tricuspid regurgitation 12/04/2019  . ESRD (end stage renal disease) ([place])   . GERD (gastroesophageal reflux disease)   . Hypertension   . Type II diabetes mellitus ([place])   . Weakness   . Syncope 12/03/2019  . Wrist pain, acute, right   . 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 AMS (altered mental status) 04/28/2019  . Hypoxemia 04/28/2019  . Fall 06/09/2018  . Fall at home, initial encounter 06/09/2018  . Anemia of chronic disease 06/09/2018  . Syncope and collapse 06/09/2018  . CKD (chronic kidney disease) stage 5, GFR less than 15 ml/min (HCC) 03/10/2018  . Acute encephalopathy 03/10/2018  . Hypermagnesemia 03/10/2018  . AKI (acute kidney injury) ([place]) 11/09/2016  . Hypercalcemia 11/09/2016  . Hyponatremia 11/09/2016  . Hypovolemia 11/09/2016  . Accelerated hypertension 11/09/2016  . Acute lower UTI 11/09/2016  . Nephrolithiasis 10/24/2016  . Constipation 10/22/2016  . CAP (community acquired pneumonia) 10/22/2016  . Hydronephrosis of right kidney 10/22/2016  . Metabolic acidosis 38/25/0539  . CVA (cerebral vascular accident) ([place]) 07/03/2016  . HAP (hospital-acquired pneumonia) 07/03/2016  . Sepsis secondary to UTI ([place])   . UTI due  to extended-spectrum beta lactamase (ESBL) producing Escherichia coli   . Acute pyelonephritis   . Bacteremia due to Escherichia coli   . Colitis, indeterminate   . Uncontrolled type 2 diabetes mellitus with complication ([place])   . 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 associated with diabetes mellitus due to underlying condition ([place])   . Chronic diastolic CHF (congestive heart failure) ([place])   . Abdominal pain   . Colitis   . Hypophosphatemia 06/12/2016  . Hypokalemia 06/11/2016  . Hypocalcemia 06/11/2016  . Acute kidney injury superimposed on chronic kidney disease ([place]) 06/11/2016  . Proliferative diabetic  retinopathy ([place]) 04/29/2016  . Closed fracture of left distal radius 01/28/2016  . Poor social situation 03/09/2013  . Depression 03/01/2013  . Abnormal mammogram 12/20/2012  . Retinal detachment 11/17/2012  . 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 ([place]) 55/73/2202  . Hyperlipidemia 02/09/2007  . Essential hypertension 02/09/2007  . GERD 02/09/2007    Past Surgical History:  Procedure Laterality Date  . AV FISTULA PLACEMENT Left 02/21/2019   Procedure: BRACHIOCEPHALIC ARTERIOVENOUS (AV) FISTULA CREATION;  Surgeon: [name], MD;  Location: [place];  Service: Vascular;  Laterality: Left;  . BASCILIC VEIN TRANSPOSITION Left 04/11/2019   Procedure: SECOND STAGE BASILIC VEIN TRANSPOSITION LEFT ARM;  Surgeon: [name], MD;  Location: [place];  Service: Vascular;  Laterality: Left;  . IR FLUORO GUIDE CV LINE RIGHT  04/29/2019  . IR US GUIDE VASC ACCESS RIGHT  04/29/2019  . OPEN REDUCTION INTERNAL FIXATION (ORIF) DISTAL RADIAL FRACTURE Left 01/28/2016   Procedure: OPEN REDUCTION INTERNAL FIXATION (ORIF) DISTAL RADIAL FRACTURE;  Surgeon: Iran Planas, MD;  Location: [place];  Service: Orthopedics;  Laterality: Left;  . [place] CATH N/A 12/05/2019   Procedure: RIGHT HEART CATH;  Surgeon: Nelva Bush, MD;  Location: [place] CV LAB;  Service: Cardiovascular;  Laterality: N/A;  . TONSILLECTOMY AND ADENOIDECTOMY     age 33     OB History   No obstetric history on file.     Family History  Problem Relation Age of Onset  . Stroke Mother   . Diabetes Mother   . Kidney failure Mother   . Heart failure Mother   . Stroke Father   . Cancer Sister        Breast- 71's    Social History   Tobacco Use  . Smoking status: Current Every Day Smoker    Packs/day: 0.30    Years: 32.00    Pack years: 9.60    Types: Cigarettes    Last attempt to quit: 09/23/2012    Years since quitting: 7.4  .  Smokeless tobacco: Never Used  . Tobacco comment: 8 cigarettes/day  Vaping Use  . Vaping Use: Never used  Substance Use Topics  . Alcohol use: No  . Drug use: No    Home Medications Prior to Admission medications   Medication Sig Start Date End Date Taking? Authorizing Provider  acetaminophen (TYLENOL) 325 MG tablet Take 650 mg by mouth every 4 (four) hours as needed for mild pain.    [provider]  aspirin 81 MG chewable tablet Chew 81 mg by mouth daily.    [provider]  atorvastatin (LIPITOR) 40 MG tablet Take 40 mg by mouth at bedtime.     [provider]  diphenhydrAMINE (BANOPHEN) 25 mg capsule Take 25 mg by mouth every 4 (four)  hours as needed for itching or allergies.    [provider]  famotidine (PEPCID) 10 MG tablet Take 10 mg by mouth daily.    [provider]  FLUoxetine (PROZAC) 10 MG capsule Take 10 mg by mouth daily.    [provider]  gabapentin (NEURONTIN) 100 MG capsule Take 1 capsule (100 mg total) by mouth 2 (two) times daily. 05/04/19 02/20/20  [name] Ghazi, Pratik D, DO  insulin lispro (HUMALOG) 100 UNIT/ML injection Inject 12 Units into the skin 3 (three) times daily with meals. Hold for BGL <150 or if patient is not eating.    [provider]  loratadine (CLARITIN) 10 MG tablet Take 10 mg by mouth daily.    [provider]  metoprolol tartrate (LOPRESSOR) 100 MG tablet Take 100 mg by mouth 2 (two) times daily.    [provider]  Multiple Vitamins-Minerals (CERTAVITE SENIOR/ANTIOXIDANT) TABS Take 1 tablet by mouth daily.    [provider]  omeprazole (PRILOSEC) 40 MG capsule Take 40 mg by mouth daily.    [provider]  OVER THE COUNTER MEDICATION Wild cherry 30 Mls daily by mouth    [provider]  polyethylene glycol powder (GAVILAX) 17 GM/SCOOP powder Take 17 g by mouth See admin instructions. Mix 17 grams of powder into 8 ounces of fluid, stir, and drink  (by mouth) once a day    [provider]  SACCHAROMYCES BOULARDII PO Take 1 capsule by mouth 2 (two) times daily.    [provider]  senna-docusate (SENOKOT-S) 8.6-50 MG tablet Take 1 tablet by mouth at bedtime as needed for mild constipation. 06/11/18   Regalado, [name], MD  sevelamer carbonate (RENVELA) 800 MG tablet Take 800 mg by mouth 3 (three) times daily with meals.    [provider]  sodium bicarbonate 650 MG tablet Take 1,300 mg by mouth 2 (two) times daily.    [provider]  Vitamin D, Ergocalciferol, (DRISDOL) 50000 units CAPS capsule Take 1 capsule (50,000 Units total) by mouth every 7 (seven) days. Patient taking differently: Take 50,000 Units by mouth every Wednesday.  11/21/16   Ghimire, Henreitta Leber, MD    Allergies    Hydrocodone  Review of Systems   Review of Systems  Constitutional: Negative for fever.  HENT: Negative for rhinorrhea and sore throat.   Eyes: Negative for redness.  Respiratory: Negative for cough and shortness of breath.   Cardiovascular: Negative for chest pain.  Gastrointestinal: Negative for abdominal pain, diarrhea, nausea and vomiting.  Genitourinary: Negative for dysuria, frequency, hematuria and urgency.  Musculoskeletal: Positive for arthralgias, back pain and neck pain. Negative for myalgias.  Skin: Negative for rash.  Neurological: Positive for headaches. Negative for dizziness, syncope, facial asymmetry, weakness and numbness.  Psychiatric/Behavioral: Negative for confusion.    Physical Exam Updated Vital Signs BP (!) 211/79   Pulse 75   Temp 98.5 F (36.9 C) (Oral)   Resp 18   LMP 03/06/2013 (LMP Unknown)   SpO2 95%   Physical Exam Vitals and nursing Michael reviewed.  Constitutional:      Appearance: She is well-developed.  HENT:     Head: Normocephalic and atraumatic. No raccoon eyes or Battle's sign.     Right Ear: Tympanic membrane, ear canal and external ear normal. No hemotympanum.      Left Ear: Tympanic membrane, ear canal and external ear normal. No hemotympanum.     Nose: Nose normal.     Mouth/Throat:  Pharynx: Uvula midline.  Eyes:     General: Lids are normal.     Extraocular Movements:     Right eye: No nystagmus.     Left eye: No nystagmus.     Conjunctiva/sclera: Conjunctivae normal.     Pupils: Pupils are equal, round, and reactive to light.     Comments: No visible hyphema noted  Cardiovascular:     Rate and Rhythm: Normal rate and regular rhythm.  Pulmonary:     Effort: Pulmonary effort is normal.     Breath sounds: Normal breath sounds.  Abdominal:     Palpations: Abdomen is soft.     Tenderness: There is no abdominal tenderness.  Musculoskeletal:     Right shoulder: No tenderness. Normal range of motion.     Left shoulder: No tenderness. Normal range of motion.     Right elbow: Normal range of motion. Tenderness present in olecranon process.     Left elbow: Normal range of motion. No tenderness.     Right wrist: No tenderness. Normal range of motion.     Left wrist: No tenderness. Normal range of motion.     Cervical back: Normal range of motion and neck supple. Tenderness and bony tenderness present. Normal range of motion.     Thoracic back: Tenderness and bony tenderness present.     Lumbar back: Tenderness (lower) present. No bony tenderness.     Right lower leg: No tenderness. No edema.     Left lower leg: No edema.     Comments: R elbow: small healing abrasion over olecranon  Skin:    General: Skin is warm and dry.  Neurological:     Mental Status: She is alert and oriented to person, place, and time.     GCS: GCS eye subscore is 4. GCS verbal subscore is 5. GCS motor subscore is 6.     Cranial Nerves: No cranial nerve deficit.     Sensory: No sensory deficit.     Coordination: Coordination normal.     Deep Tendon Reflexes: Reflexes are normal and symmetric.     ED Results / Procedures / Treatments   Labs (all labs ordered are  listed, but only abnormal results are displayed) Labs Reviewed  BASIC METABOLIC PANEL - Abnormal; Notable for the following components:      Result Value   Chloride 95 (*)    Glucose, Bld 62 (*)    Creatinine, Ser 2.49 (*)    GFR calc non Af Amer 20 (*)    GFR calc Af Amer 24 (*)    All other components within normal limits  CBC - Abnormal; Notable for the following components:   RBC 3.65 (*)    Hemoglobin 10.8 (*)    HCT 35.1 (*)    RDW 15.8 (*)    All other components within normal limits  I-STAT BETA HCG BLOOD, ED ([facility], [facility], [facility] ONLY) - Abnormal; Notable for the following components:   I-stat hCG, quantitative 6.9 (*)    All other components within normal limits  TROPONIN I (HIGH SENSITIVITY)  TROPONIN I (HIGH SENSITIVITY)    EKG EKG Interpretation  Date/Time:  Tuesday February 28 2020 20:28:33 EDT Ventricular Rate:  73 PR Interval:  142 QRS Duration: 124 QT Interval:  456 QTC Calculation: 502 R Axis:   109 Text Interpretation: Normal sinus rhythm Right bundle branch block Abnormal ECG No significant change since last tracing Confirmed by Isla Pence [phone]) on 02/29/2020 8:50:21 AM  Radiology DG Chest 2 View  Result Date: 02/28/2020 CLINICAL DATA:  Chest pain.  Fall 4 days ago. EXAM: CHEST - 2 VIEW COMPARISON:  Chest radiograph head CT 12/02/2019. FINDINGS: Chronic cardiomegaly unchanged from prior exam. Linear atelectasis or scarring in the left mid lung. No pneumothorax or pleural effusion. No focal airspace disease. No evidence of acute osseous abnormality. The bones are under mineralized. IMPRESSION: Chronic cardiomegaly. No acute finding. Linear atelectasis or scarring in the left mid lung. Electronically Signed   By: Keith Rake M.D.   On: 02/28/2020 20:53   DG Cervical Spine Complete  Result Date: 02/29/2020 CLINICAL DATA:  Fall 5 days ago, back pain. EXAM: CERVICAL SPINE - COMPLETE 4+ VIEW COMPARISON:  CT cervical spine 06/09/2018. FINDINGS: On the lateral  view the cervical spine is visualized to the level of C4. There is straightening of the normal cervical lordosis likely due to positioning. Alignment is otherwise normal. Dens is well positioned between the lateral masses of C1. There is markedly limited evaluation of the dens for acute fracture on the open-mouth view due to overlying osseous structures. No acute displaced fracture is detected. Cervical disc heights are preserved.No severe osseous neural foraminal stenosis on oblique views. No aggressive-appearing focal osseous lesions. Pre-vertebral soft tissues are within normal limits. No acute displaced fractures of the visualized ribs. IMPRESSION: Please Michael markedly limited evaluation due to overlying osseous structures as described above. No acute displaced fracture or traumatic listhesis of the cervical spine. No severe degenerative changes of the cervical spine. Please see separately dictated radiographs of the thoracic and lumbar spine. Electronically Signed   By: Iven Finn M.D.   On: 02/29/2020 08:46   DG Thoracic Spine 2 View  Result Date: 02/29/2020 CLINICAL DATA:  Fall 5 days ago with back pain, initial encounter EXAM: THORACIC SPINE 2 VIEWS COMPARISON:  02/28/2020 FINDINGS: Vertebral body height is well maintained. Pedicles are within normal limits. No paraspinal mass is seen. Very mild midthoracic osteophytic changes seen. No rib abnormality is noted. IMPRESSION: Mild degenerative change without acute abnormality. Electronically Signed   By: Inez Catalina M.D.   On: 02/29/2020 08:44   DG Lumbar Spine Complete  Result Date: 02/29/2020 CLINICAL DATA:  Fall several days ago with back pain, initial encounter EXAM: LUMBAR SPINE - COMPLETE 4+ VIEW COMPARISON:  None. FINDINGS: Five lumbar type vertebral bodies are well visualized. Vertebral body height is well maintained. No pars defects are noted. Mild facet hypertrophic changes are seen. No anterolisthesis is noted. Aortic calcifications are  seen without aneurysmal dilatation. IMPRESSION: Mild degenerative change without acute abnormality. Electronically Signed   By: Inez Catalina M.D.   On: 02/29/2020 08:43   DG Elbow 2 Views Right  Result Date: 02/28/2020 CLINICAL DATA:  Right elbow bruising after fall 4 days ago. EXAM: RIGHT ELBOW - 2 VIEW COMPARISON:  None. FINDINGS: There is no evidence of fracture, dislocation, or joint effusion. Trace coronary degenerative spurring. Joint spaces appear preserved. Soft tissue edema is noted medially and about the dorsal aspect of the proximal forearm. No radiopaque foreign body. IMPRESSION: Soft tissue edema without acute fracture, dislocation, or joint effusion. Electronically Signed   By: Keith Rake M.D.   On: 02/28/2020 20:56    Procedures Procedures (including critical care time)  Medications Ordered in ED Medications  metoprolol tartrate (LOPRESSOR) tablet 100 mg (100 mg Oral Given 02/29/20 0749)  acetaminophen (TYLENOL) tablet 650 mg (650 mg Oral Given 02/29/20 0750)    ED Course  I have  reviewed the triage vital signs and the nursing notes.  Pertinent labs & imaging results that were available during my care of the patient were reviewed by me and considered in my medical decision making (see chart for details).  Patient seen and examined. Reviewed facility noted. Patient looks good.  She has some tenderness at the areas noted.  Chest x-ray and elbow x-ray ordered prior to my exam.  She also does report falling onto her back and has pain generally over the cervical and thoracic spines as well as the lower L-spine.  For these reasons, will send for plain film imaging to evaluate for any signs of compression fractures.  Blood pressure is is high, as patient has not received her evening or morning blood pressure medications.  This was ordered as well.  Vital signs reviewed and are as follows: BP (!) 208/89 (BP Location: Right Arm)   Pulse 77   Temp 98.5 F (36.9 C) (Oral)   Resp 17    Ht 4\' 6"  (1.372 m)   Wt 47.4 kg   LMP 03/06/2013 (LMP Unknown)   SpO2 100%   BMI 25.17 kg/m    9:00 AM Imaging reassuring. Pt discussed with and seen by Dr. Gilford Raid. Ready for d/c home.   Encouraged PCP if symptoms persist for another week. Patient urged to return with worsening symptoms or other concerns. Patient verbalized understanding and agrees with plan.    Clinical Course as of Feb 29 904  Wed Feb 29, 2020  9021 Comment 3:        [JH]    Clinical Course User Index [JH] Isla Pence, MD   MDM Rules/Calculators/A&P                          Patient with multiple areas of pain after a fall occurring several days ago.  Chest x-ray, elbow x-ray, spine films are negative for acute fractures.  Patient moves reasonably well.  She is at her cognitive baseline without severe headache, vomiting.  Low concern for intracranial injury at this point, especially 5 days after the injury.  Feel patient is stable for discharge to home at this time.   Final Clinical Impression(s) / ED Diagnoses Final diagnoses:  Musculoskeletal pain  Fall, initial encounter  Essential hypertension    Rx / DC Orders ED Discharge Orders    None       Carlisle Cater, PA-C 02/29/20 1155    Isla Pence, MD 02/29/20 [phone]

## 2020-03-29 ENCOUNTER — Other Ambulatory Visit: Payer: Self-pay

## 2020-03-29 ENCOUNTER — Emergency Department (HOSPITAL_COMMUNITY): Payer: Medicare Other

## 2020-03-29 ENCOUNTER — Observation Stay (HOSPITAL_COMMUNITY)
Admission: EM | Admit: 2020-03-29 | Discharge: 2020-03-30 | Disposition: A | Discharge status: Home / Self Care | Payer: Medicare Other | Attending: Internal Medicine | Admitting: Internal Medicine

## 2020-03-29 DIAGNOSIS — Z992 Dependence on renal dialysis: Secondary | ICD-10-CM | POA: Insufficient documentation

## 2020-03-29 DIAGNOSIS — R410 Disorientation, unspecified: Secondary | ICD-10-CM | POA: Diagnosis not present

## 2020-03-29 DIAGNOSIS — I10 Essential (primary) hypertension: Secondary | ICD-10-CM

## 2020-03-29 DIAGNOSIS — Z20822 Contact with and (suspected) exposure to covid-19: Secondary | ICD-10-CM | POA: Diagnosis not present

## 2020-03-29 DIAGNOSIS — N186 End stage renal disease: Secondary | ICD-10-CM | POA: Insufficient documentation

## 2020-03-29 DIAGNOSIS — N185 Chronic kidney disease, stage 5: Secondary | ICD-10-CM | POA: Diagnosis not present

## 2020-03-29 DIAGNOSIS — Z7984 Long term (current) use of oral hypoglycemic drugs: Secondary | ICD-10-CM | POA: Insufficient documentation

## 2020-03-29 DIAGNOSIS — I120 Hypertensive chronic kidney disease with stage 5 chronic kidney disease or end stage renal disease: Secondary | ICD-10-CM | POA: Insufficient documentation

## 2020-03-29 DIAGNOSIS — R4182 Altered mental status, unspecified: Secondary | ICD-10-CM | POA: Diagnosis not present

## 2020-03-29 DIAGNOSIS — F17210 Nicotine dependence, cigarettes, uncomplicated: Secondary | ICD-10-CM | POA: Diagnosis not present

## 2020-03-29 DIAGNOSIS — Z79899 Other long term (current) drug therapy: Secondary | ICD-10-CM | POA: Diagnosis not present

## 2020-03-29 DIAGNOSIS — Z794 Long term (current) use of insulin: Secondary | ICD-10-CM | POA: Diagnosis not present

## 2020-03-29 DIAGNOSIS — E1122 Type 2 diabetes mellitus with diabetic chronic kidney disease: Secondary | ICD-10-CM

## 2020-03-29 DIAGNOSIS — I639 Cerebral infarction, unspecified: Secondary | ICD-10-CM | POA: Diagnosis present

## 2020-03-29 DIAGNOSIS — E119 Type 2 diabetes mellitus without complications: Secondary | ICD-10-CM | POA: Diagnosis not present

## 2020-03-29 DIAGNOSIS — R471 Dysarthria and anarthria: Secondary | ICD-10-CM

## 2020-03-29 LAB — I-STAT BETA HCG BLOOD, ED (MC, WL, AP ONLY): I-stat hCG, quantitative: <5 m[IU]/mL (ref <5)

## 2020-03-29 LAB — RESPIRATORY PANEL BY RT PCR (FLU A&B, COVID)
Influenza A by PCR: NEGATIVE
Influenza B by PCR: NEGATIVE
SARS Coronavirus 2 by RT PCR: NEGATIVE

## 2020-03-29 LAB — DIFFERENTIAL
Abs Immature Granulocytes: 0.08 10*3/uL — ABNORMAL HIGH (ref 0.00–0.07)
Basophils Absolute: 0 10*3/uL (ref 0.0–0.1)
Basophils Relative: 0 %
Eosinophils Absolute: 0.2 10*3/uL (ref 0.0–0.5)
Eosinophils Relative: 2 %
Immature Granulocytes: 1 %
Lymphocytes Relative: 10 %
Lymphs Abs: 1.2 10*3/uL (ref 0.7–4.0)
Monocytes Absolute: 1 10*3/uL (ref 0.1–1.0)
Monocytes Relative: 9 %
Neutro Abs: 9.1 10*3/uL — ABNORMAL HIGH (ref 1.7–7.7)
Neutrophils Relative %: 78 %

## 2020-03-29 LAB — COMPREHENSIVE METABOLIC PANEL
ALT: 12 U/L (ref 0–44)
AST: 15 U/L (ref 15–41)
Albumin: 3.5 g/dL (ref 3.5–5.0)
Alkaline Phosphatase: 82 U/L (ref 38–126)
Anion gap: 12 (ref 5–15)
BUN: 7 mg/dL (ref 6–20)
CO2: 31 mmol/L (ref 22–32)
Calcium: 7.8 mg/dL — ABNORMAL LOW (ref 8.9–10.3)
Chloride: 98 mmol/L (ref 98–111)
Creatinine, Ser: 1.77 mg/dL — ABNORMAL HIGH (ref 0.44–1.00)
GFR calc non Af Amer: 31 mL/min — ABNORMAL LOW (ref >60)
Glucose, Bld: 109 mg/dL — ABNORMAL HIGH (ref 70–99)
Potassium: 3.3 mmol/L — ABNORMAL LOW (ref 3.5–5.1)
Sodium: 141 mmol/L (ref 135–145)
Total Bilirubin: 0.3 mg/dL (ref 0.3–1.2)
Total Protein: 7 g/dL (ref 6.5–8.1)

## 2020-03-29 LAB — I-STAT CHEM 8, ED
BUN: 8 mg/dL (ref 6–20)
Calcium, Ion: 0.93 mmol/L — ABNORMAL LOW (ref 1.15–1.40)
Chloride: 95 mmol/L — ABNORMAL LOW (ref 98–111)
Creatinine, Ser: 1.7 mg/dL — ABNORMAL HIGH (ref 0.44–1.00)
Glucose, Bld: 107 mg/dL — ABNORMAL HIGH (ref 70–99)
HCT: 28 % — ABNORMAL LOW (ref 36.0–46.0)
Hemoglobin: 9.5 g/dL — ABNORMAL LOW (ref 12.0–15.0)
Potassium: 3.4 mmol/L — ABNORMAL LOW (ref 3.5–5.1)
Sodium: 139 mmol/L (ref 135–145)
TCO2: 33 mmol/L — ABNORMAL HIGH (ref 22–32)

## 2020-03-29 LAB — CBG MONITORING, ED: Glucose-Capillary: 105 mg/dL — ABNORMAL HIGH (ref 70–99)

## 2020-03-29 LAB — PROTIME-INR
INR: 1 (ref 0.8–1.2)
Prothrombin Time: 13.2 seconds (ref 11.4–15.2)

## 2020-03-29 LAB — CBC
HCT: 29.3 % — ABNORMAL LOW (ref 36.0–46.0)
Hemoglobin: 8.8 g/dL — ABNORMAL LOW (ref 12.0–15.0)
MCH: 29.7 pg (ref 26.0–34.0)
MCHC: 30 g/dL (ref 30.0–36.0)
MCV: 99 fL (ref 80.0–100.0)
Platelets: 234 10*3/uL (ref 150–400)
RBC: 2.96 MIL/uL — ABNORMAL LOW (ref 3.87–5.11)
RDW: 17.1 % — ABNORMAL HIGH (ref 11.5–15.5)
WBC: 11.6 10*3/uL — ABNORMAL HIGH (ref 4.0–10.5)
nRBC: 0 % (ref 0.0–0.2)

## 2020-03-29 LAB — APTT: aPTT: 28 seconds (ref 24–36)

## 2020-03-29 MED ORDER — SENNOSIDES-DOCUSATE SODIUM 8.6-50 MG PO TABS: 1.0000 | ORAL_TABLET | Freq: Every evening | ORAL | Status: DC | PRN

## 2020-03-29 MED ORDER — ACETAMINOPHEN 325 MG PO TABS: 650.0000 mg | ORAL_TABLET | ORAL | Status: DC | PRN

## 2020-03-29 MED ORDER — ATORVASTATIN CALCIUM 40 MG PO TABS
40.0000 mg | ORAL_TABLET | Freq: Every day | ORAL | Status: DC
  Administered 2020-03-29: 40 mg via ORAL
  Filled 2020-03-29: qty 1

## 2020-03-29 MED ORDER — METOPROLOL TARTRATE 50 MG PO TABS
100.0000 mg | ORAL_TABLET | Freq: Two times a day (BID) | ORAL | Status: DC
  Administered 2020-03-29 – 2020-03-30 (×2): 100 mg via ORAL
  Filled 2020-03-29: qty 2
  Filled 2020-03-29 (×2): qty 4

## 2020-03-29 MED ORDER — STROKE: EARLY STAGES OF RECOVERY BOOK: Freq: Once | Status: DC

## 2020-03-29 MED ORDER — SODIUM CHLORIDE 0.9% FLUSH: 3.0000 mL | Freq: Once | INTRAVENOUS | Status: DC

## 2020-03-29 MED ORDER — SODIUM BICARBONATE 650 MG PO TABS
1300.0000 mg | ORAL_TABLET | Freq: Two times a day (BID) | ORAL | Status: DC
  Administered 2020-03-29 – 2020-03-30 (×2): 1300 mg via ORAL
  Filled 2020-03-29 (×3): qty 2

## 2020-03-29 MED ORDER — FLUOXETINE HCL 10 MG PO CAPS
10.0000 mg | ORAL_CAPSULE | Freq: Every day | ORAL | Status: DC
  Administered 2020-03-30: 10 mg via ORAL
  Filled 2020-03-29: qty 1

## 2020-03-29 MED ORDER — PANTOPRAZOLE SODIUM 40 MG PO TBEC
40.0000 mg | DELAYED_RELEASE_TABLET | Freq: Every day | ORAL | Status: DC
  Administered 2020-03-30: 40 mg via ORAL
  Filled 2020-03-29: qty 1

## 2020-03-29 MED ORDER — SEVELAMER CARBONATE 800 MG PO TABS
800.0000 mg | ORAL_TABLET | Freq: Three times a day (TID) | ORAL | Status: DC
  Administered 2020-03-30 (×2): 800 mg via ORAL
  Filled 2020-03-29 (×2): qty 1

## 2020-03-29 MED ORDER — ACETAMINOPHEN 160 MG/5ML PO SOLN: 650.0000 mg | ORAL | Status: DC | PRN

## 2020-03-29 MED ORDER — ACETAMINOPHEN 650 MG RE SUPP: 650.0000 mg | RECTAL | Status: DC | PRN

## 2020-03-29 NOTE — ED Provider Notes (Signed)
[name] EMERGENCY DEPARTMENT Provider Note   CSN: [id number] Arrival date & time: 03/29/20  1638  An emergency department physician performed an initial assessment on this suspected stroke patient at 1641.  History Chief Complaint  Patient presents with  . Code Stroke    [name] Michael is a 59 y.o. female history of ESRD on dialysis, hypertension, hyperlipidemia, stroke who presents as a code stroke from dialysis.  Patient walked into dialysis and was speaking normally at that time.  Partially through dialysis, she had acute onset confusion and slurred speech.  She finished the last hour of her dialysis session and then the ambulance was called.  She is exhibiting generalized weakness without focality or laterality.  Patient denies pain but is unable to provide any additional information about her symptoms.   Altered Mental Status Presenting symptoms: lethargy   Most recent episode:  Today Episode history:  Single Timing:  Constant Progression:  Unchanged Chronicity:  New Associated symptoms: slurred speech        Past Medical History:  Diagnosis Date  . Ambulates with cane   . Constipation   . Depression   . ESRD (end stage renal disease) ([place])    a. TTS Dialysis.  Marland Kitchen GERD (gastroesophageal reflux disease)   . History of stress test    a. 01/2003 MV: EF 74%, no ischemia/infarct.  . Hyperlipidemia   . Hypertension   . Osteoporosis   . Stroke [facility]) 04-01-11   left frontal subcortical, saw Dr. [name]   . Syncope 11/2019  . TIA (transient ischemic attack) 03-12-11  . Tobacco abuse   . Tricuspid regurgitation    a. 05/2016 Echo: EF 65-70%, Gr2DD, mild MR, nl RV fxn, Triv TR, PASP 77mmHg; b. 05/2018 Echo: EF 60-65%, Gr2DD, mild MR/TR, RVSP/PASP 30mmHg; c. 11/2019 Echo: EF 55-60%, no rwma, mild-mod MR, Sev TR w/ RV dilatation (CTA chest neg for PE).  . Type II diabetes mellitus ([place])   . Vitamin D deficiency     Patient Active Problem List   Diagnosis  Date Noted  . Stroke (cerebrum) ([place]) 03/29/2020  . Hypoxia   . Acute on chronic right-sided heart failure ([place])   . Severe tricuspid regurgitation 12/04/2019  . ESRD (end stage renal disease) ([place])   . GERD (gastroesophageal reflux disease)   . Hypertension   . Type II diabetes mellitus ([place])   . Weakness   . Syncope 12/03/2019  . Wrist pain, acute, right   . 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 AMS (altered mental status) 04/28/2019  . Hypoxemia 04/28/2019  . Fall 06/09/2018  . Fall at home, initial encounter 06/09/2018  . Anemia of chronic disease 06/09/2018  . Syncope and collapse 06/09/2018  . CKD (chronic kidney disease) stage 5, GFR less than 15 ml/min (HCC) 03/10/2018  . Acute encephalopathy 03/10/2018  . Hypermagnesemia 03/10/2018  . AKI (acute kidney injury) ([place]) 11/09/2016  . Hypercalcemia 11/09/2016  . Hyponatremia 11/09/2016  . Hypovolemia 11/09/2016  . Accelerated hypertension 11/09/2016  . Acute lower UTI 11/09/2016  . Nephrolithiasis 10/24/2016  . Constipation 10/22/2016  . CAP (community acquired pneumonia) 10/22/2016  . Hydronephrosis of right kidney 10/22/2016  . Metabolic acidosis 03/47/4259  . CVA (cerebral vascular accident) ([place]) 07/03/2016  . HAP (hospital-acquired pneumonia) 07/03/2016  . Sepsis secondary to UTI ([place])   . UTI due to extended-spectrum beta lactamase (ESBL) producing Escherichia coli   . Acute pyelonephritis   . Bacteremia due to Escherichia coli   . Colitis,  indeterminate   . Uncontrolled type 2 diabetes mellitus with complication ([place])   . 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 associated with diabetes mellitus due to underlying condition ([place])   . Chronic diastolic CHF (congestive heart failure) ([place])   . Abdominal pain   . Colitis   . Hypophosphatemia 06/12/2016  . Hypokalemia 06/11/2016  . Hypocalcemia 06/11/2016  . Acute kidney injury superimposed on chronic kidney disease ([place]) 06/11/2016  .  Proliferative diabetic retinopathy ([place]) 04/29/2016  . Closed fracture of left distal radius 01/28/2016  . Poor social situation 03/09/2013  . Depression 03/01/2013  . Abnormal mammogram 12/20/2012  . Retinal detachment 11/17/2012  . 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 ([place]) 72/53/6644  . Hyperlipidemia 02/09/2007  . Essential hypertension 02/09/2007  . GERD 02/09/2007    Past Surgical History:  Procedure Laterality Date  . AV FISTULA PLACEMENT Left 02/21/2019   Procedure: BRACHIOCEPHALIC ARTERIOVENOUS (AV) FISTULA CREATION;  Surgeon: [name], MD;  Location: [place];  Service: Vascular;  Laterality: Left;  . BASCILIC VEIN TRANSPOSITION Left 04/11/2019   Procedure: SECOND STAGE BASILIC VEIN TRANSPOSITION LEFT ARM;  Surgeon: [name], MD;  Location: [place];  Service: Vascular;  Laterality: Left;  . IR FLUORO GUIDE CV LINE RIGHT  04/29/2019  . IR US GUIDE VASC ACCESS RIGHT  04/29/2019  . OPEN REDUCTION INTERNAL FIXATION (ORIF) DISTAL RADIAL FRACTURE Left 01/28/2016   Procedure: OPEN REDUCTION INTERNAL FIXATION (ORIF) DISTAL RADIAL FRACTURE;  Surgeon: Iran Planas, MD;  Location: [place];  Service: Orthopedics;  Laterality: Left;  . [place] CATH N/A 12/05/2019   Procedure: RIGHT HEART CATH;  Surgeon: Nelva Bush, MD;  Location: [place] CV [facility];  Service: Cardiovascular;  Laterality: N/A;  . TONSILLECTOMY AND ADENOIDECTOMY     age 59     OB History   No obstetric history on file.     Family History  Problem Relation Age of Onset  . Stroke Mother   . Diabetes Mother   . Kidney failure Mother   . Heart failure Mother   . Stroke Father   . Cancer Sister        Breast- 33's    Social History   Tobacco Use  . Smoking status: Current Every Day Smoker    Packs/day: 0.30    Years: 32.00    Pack years: 9.60    Types: Cigarettes    Last attempt to quit: 09/23/2012    Years  since quitting: 7.5  . Smokeless tobacco: Never Used  . Tobacco comment: 8 cigarettes/day  Vaping Use  . Vaping Use: Never used  Substance Use Topics  . Alcohol use: No  . Drug use: No    Home Medications Prior to Admission medications   Medication Sig Start Date End Date Taking? Authorizing Provider  acetaminophen (TYLENOL) 325 MG tablet Take 650 mg by mouth every 4 (four) hours as needed for mild pain.    [provider]  aspirin 81 MG chewable tablet Chew 81 mg by mouth daily.    [provider]  atorvastatin (LIPITOR) 40 MG tablet Take 40 mg by mouth at bedtime.     [provider]  diphenhydrAMINE (BANOPHEN) 25 mg capsule Take 25 mg by mouth every 4 (four) hours as needed for itching or allergies.    [provider]  famotidine (PEPCID) 10 MG tablet Take 10 mg by mouth daily.  [provider]  FLUoxetine (PROZAC) 10 MG capsule Take 10 mg by mouth daily.    [provider]  gabapentin (NEURONTIN) 100 MG capsule Take 1 capsule (100 mg total) by mouth 2 (two) times daily. 05/04/19 02/20/20  [name] Ghazi, Pratik D, DO  insulin lispro (HUMALOG) 100 UNIT/ML injection Inject 12 Units into the skin 3 (three) times daily with meals. Hold for BGL <150 or if patient is not eating.    [provider]  loratadine (CLARITIN) 10 MG tablet Take 10 mg by mouth daily.    [provider]  metoprolol tartrate (LOPRESSOR) 100 MG tablet Take 100 mg by mouth 2 (two) times daily.    [provider]  Multiple Vitamins-Minerals (CERTAVITE SENIOR/ANTIOXIDANT) TABS Take 1 tablet by mouth daily.    [provider]  omeprazole (PRILOSEC) 40 MG capsule Take 40 mg by mouth daily.    [provider]  OVER THE COUNTER MEDICATION Wild cherry 30 Mls daily by mouth    [provider]  polyethylene glycol powder (GAVILAX) 17 GM/SCOOP powder Take 17 g by mouth See admin instructions. Mix 17 grams of powder into 8 ounces of  fluid, stir, and drink (by mouth) once a day    [provider]  SACCHAROMYCES BOULARDII PO Take 1 capsule by mouth 2 (two) times daily.    [provider]  senna-docusate (SENOKOT-S) 8.6-50 MG tablet Take 1 tablet by mouth at bedtime as needed for mild constipation. 06/11/18   Regalado, [name], MD  sevelamer carbonate (RENVELA) 800 MG tablet Take 800 mg by mouth 3 (three) times daily with meals.    [provider]  sodium bicarbonate 650 MG tablet Take 1,300 mg by mouth 2 (two) times daily.    [provider]  Vitamin D, Ergocalciferol, (DRISDOL) 50000 units CAPS capsule Take 1 capsule (50,000 Units total) by mouth every 7 (seven) days. Patient taking differently: Take 50,000 Units by mouth every Wednesday.  11/21/16   Ghimire, Henreitta Leber, MD    Allergies    Hydrocodone  Review of Systems   Review of Systems  Unable to perform ROS: Mental status change    Physical Exam Updated Vital Signs BP (!) 163/63 (BP Location: Left Leg)   Pulse 79   Resp 15   Ht 4\' 6"  (1.372 m)   Wt 50 kg   LMP 03/06/2013 (LMP Unknown)   SpO2 91%   BMI 26.58 kg/m   Physical Exam Vitals and nursing note reviewed.  Constitutional:      General: She is not in acute distress.    Appearance: She is well-developed.     Comments: Drowsy  HENT:     Head: Normocephalic and atraumatic.  Eyes:     Conjunctiva/sclera: Conjunctivae normal.  Cardiovascular:     Rate and Rhythm: Normal rate and regular rhythm.     Heart sounds: No murmur heard.   Pulmonary:     Effort: Pulmonary effort is normal. No respiratory distress.     Breath sounds: Normal breath sounds.  Abdominal:     Palpations: Abdomen is soft.     Tenderness: There is no abdominal tenderness.  Musculoskeletal:     Cervical back: Neck supple.     Right lower leg: No edema.     Left lower leg: No edema.  Skin:    General: Skin is warm and dry.  Neurological:     Comments: Equal and full strength in bilateral  upper and lower extremities.  Patient  has dysarthria but no facial asymmetry.     ED Results / Procedures / Treatments   Labs (all labs ordered are listed, but only abnormal results are displayed) Labs Reviewed  CBC - Abnormal; Notable for the following components:      Result Value   WBC 11.6 (*)    RBC 2.96 (*)    Hemoglobin 8.8 (*)    HCT 29.3 (*)    RDW 17.1 (*)    All other components within normal limits  DIFFERENTIAL - Abnormal; Notable for the following components:   Neutro Abs 9.1 (*)    Abs Immature Granulocytes 0.08 (*)    All other components within normal limits  COMPREHENSIVE METABOLIC PANEL - Abnormal; Notable for the following components:   Potassium 3.3 (*)    Glucose, Bld 109 (*)    Creatinine, Ser 1.77 (*)    Calcium 7.8 (*)    GFR calc non Af Amer 31 (*)    All other components within normal limits  I-STAT CHEM 8, ED - Abnormal; Notable for the following components:   Potassium 3.4 (*)    Chloride 95 (*)    Creatinine, Ser 1.70 (*)    Glucose, Bld 107 (*)    Calcium, Ion 0.93 (*)    TCO2 33 (*)    Hemoglobin 9.5 (*)    HCT 28.0 (*)    All other components within normal limits  CBG MONITORING, ED - Abnormal; Notable for the following components:   Glucose-Capillary 105 (*)    All other components within normal limits  RESPIRATORY PANEL BY RT PCR (FLU A&B, COVID)  PROTIME-INR  APTT  HEMOGLOBIN A1C  LIPID PANEL  I-STAT BETA HCG BLOOD, ED ([facility], [facility], [facility] ONLY)    EKG EKG Interpretation  Date/Time:  Thursday March 29 2020 17:03:21 EDT Ventricular Rate:  82 PR Interval:    QRS Duration: 144 QT Interval:  467 QTC Calculation: 546 R Axis:   101 Text Interpretation: Sinus rhythm LAE, consider biatrial enlargement RBBB and LPFB Borderline ST depression, lateral leads No significant change since last tracing Confirmed by Wandra Arthurs [phone]) on 03/29/2020 5:11:55 PM   Radiology MR ANGIO HEAD WO CONTRAST  Result Date: 03/29/2020 CLINICAL DATA:   Acute onset of altered mental status during dialysis. Slurred speech and somnolence. EXAM: MRI HEAD WITHOUT CONTRAST MRA HEAD WITHOUT CONTRAST TECHNIQUE: Multiplanar, multiecho pulse sequences of the brain and surrounding structures were obtained without intravenous contrast. Angiographic images of the head were obtained using MRA technique without contrast. COMPARISON:  Head CT 03/29/2020, MRI 03/28/2011, and MRA 03/13/2011 FINDINGS: MRI HEAD FINDINGS The study is mildly motion degraded. Brain: There is no evidence of an acute infarct, intracranial hemorrhage, mass, midline shift, or extra-axial fluid collection. Patchy T2 hyperintensities in the cerebral white matter, left greater than right, have progressed from the prior MRI and are nonspecific but compatible with moderate chronic small vessel ischemic disease. Chronic lacunar infarcts are noted in the basal ganglia, thalami, and left internal capsule. Mild chronic small vessel changes are present in the pons. Mild cerebral atrophy is within normal limits for age. Vascular: Major intracranial vascular flow voids are preserved. Skull and upper cervical spine: Unremarkable bone marrow signal. Sinuses/Orbits: Unremarkable orbits. Paranasal sinuses and mastoid air cells are clear. Other: 2.1 x 1.5 cm right parotid mass, unchanged in size from 2012. MRA HEAD FINDINGS The study is moderately motion degraded. The visualized distal right vertebral artery is widely patent and supplies the basilar. There is  absent flow related enhancement in the included distal V3 and proximal V4 segments of the left vertebral artery which represents a change from the 2012 MRA. Flow related enhancement in the more distal left V4 segment may be retrograde. Patent right AICA and bilateral SCA origins are identified. PICAs and a left AICA are not clearly identified with assessment limited by motion. The basilar artery is patent with mild diffuse irregularity attributed to motion and no  evidence of a flow limiting stenosis. Posterior communicating arteries are not identified and may be small or absent. Both PCAs are patent with distal branch vessel irregularity but no significant proximal stenosis. The internal carotid arteries are patent from skull base to carotid termini. Both distal cervical ICAs are tortuous. There are mild left cavernous and mild right and moderate left proximal supraclinoid ICA stenoses versus artifact. ACAs and MCAs are patent with branch vessel assessment limited by motion. There is a severe left A1 origin stenosis, and there may also be moderate to severe mid and distal left A1 stenoses versus artifact. No flow limiting stenosis is evident of the right A1 segment or either M1 segment. No aneurysm is identified. IMPRESSION: 1. No acute intracranial abnormality. 2. Moderate chronic small vessel ischemic disease, progressed from 2012. 3. Motion degraded head MRA without a large vessel occlusion in the anterior circulation. 4. Absent flow related enhancement in the distal V3 and proximal V4 segments of the left vertebral artery, new from 2012 and which may reflect a proximal occlusion or severe stenosis with reduced flow. 5. Severe left A1 stenosis. 6. Mild right and moderate left ICA stenoses versus artifact. 7. 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 Electronically Signed   By: Logan Bores M.D.   On: 03/29/2020 18:16   MR BRAIN WO CONTRAST  Result Date: 03/29/2020 CLINICAL DATA:  Acute onset of altered mental status during dialysis. Slurred speech and somnolence. EXAM: MRI HEAD WITHOUT CONTRAST MRA HEAD WITHOUT CONTRAST TECHNIQUE: Multiplanar, multiecho pulse sequences of the brain and surrounding structures were obtained without intravenous contrast. Angiographic images of the head were obtained using MRA technique without contrast. COMPARISON:  Head CT 03/29/2020, MRI 03/28/2011, and MRA 03/13/2011 FINDINGS:  MRI HEAD FINDINGS The study is mildly motion degraded. Brain: There is no evidence of an acute infarct, intracranial hemorrhage, mass, midline shift, or extra-axial fluid collection. Patchy T2 hyperintensities in the cerebral white matter, left greater than right, have progressed from the prior MRI and are nonspecific but compatible with moderate chronic small vessel ischemic disease. Chronic lacunar infarcts are noted in the basal ganglia, thalami, and left internal capsule. Mild chronic small vessel changes are present in the pons. Mild cerebral atrophy is within normal limits for age. Vascular: Major intracranial vascular flow voids are preserved. Skull and upper cervical spine: Unremarkable bone marrow signal. Sinuses/Orbits: Unremarkable orbits. Paranasal sinuses and mastoid air cells are clear. Other: 2.1 x 1.5 cm right parotid mass, unchanged in size from 2012. MRA HEAD FINDINGS The study is moderately motion degraded. The visualized distal right vertebral artery is widely patent and supplies the basilar. There is absent flow related enhancement in the included distal V3 and proximal V4 segments of the left vertebral artery which represents a change from the 2012 MRA. Flow related enhancement in the more distal left V4 segment may be retrograde. Patent right AICA and bilateral SCA origins are identified. PICAs and a left AICA are not clearly identified with assessment limited by motion.  The basilar artery is patent with mild diffuse irregularity attributed to motion and no evidence of a flow limiting stenosis. Posterior communicating arteries are not identified and may be small or absent. Both PCAs are patent with distal branch vessel irregularity but no significant proximal stenosis. The internal carotid arteries are patent from skull base to carotid termini. Both distal cervical ICAs are tortuous. There are mild left cavernous and mild right and moderate left proximal supraclinoid ICA stenoses versus  artifact. ACAs and MCAs are patent with branch vessel assessment limited by motion. There is a severe left A1 origin stenosis, and there may also be moderate to severe mid and distal left A1 stenoses versus artifact. No flow limiting stenosis is evident of the right A1 segment or either M1 segment. No aneurysm is identified. IMPRESSION: 1. No acute intracranial abnormality. 2. Moderate chronic small vessel ischemic disease, progressed from 2012. 3. Motion degraded head MRA without a large vessel occlusion in the anterior circulation. 4. Absent flow related enhancement in the distal V3 and proximal V4 segments of the left vertebral artery, new from 2012 and which may reflect a proximal occlusion or severe stenosis with reduced flow. 5. Severe left A1 stenosis. 6. Mild right and moderate left ICA stenoses versus artifact. 7. 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 Electronically Signed   By: Logan Bores M.D.   On: 03/29/2020 18:16   DG Chest Port 1 View  Result Date: 03/29/2020 CLINICAL DATA:  Change in mental status EXAM: PORTABLE CHEST 1 VIEW COMPARISON:  None. FINDINGS: The heart size and mediastinal contours are within normal limits. Both lungs are clear. The visualized skeletal structures are unremarkable. IMPRESSION: No active disease. Electronically Signed   By: Prudencio Pair M.D.   On: 03/29/2020 19:25   CT HEAD CODE STROKE WO CONTRAST  Result Date: 03/29/2020 CLINICAL DATA:  Code stroke. Sudden onset altered mental status with slurred speech. EXAM: CT HEAD WITHOUT CONTRAST TECHNIQUE: Contiguous axial images were obtained from the base of the skull through the vertex without intravenous contrast. COMPARISON:  12/02/2019 FINDINGS: Brain: There is no evidence of an acute infarct, intracranial hemorrhage, mass, midline shift, or extra-axial fluid collection. Mild cerebral atrophy is within normal limits for age. Patchy hypodensities in the  cerebral white matter bilaterally are unchanged and nonspecific but compatible with chronic small vessel ischemic disease which is moderate for age. Chronic lacunar infarcts are again noted in the bilateral basal ganglia and thalami. Vascular: Calcified atherosclerosis at the skull base. Skull: No fracture or suspicious osseous lesion. Sinuses/Orbits: Paranasal sinuses and mastoid air cells are clear. Unremarkable orbits. Other: Incompletely imaged 1.6 cm right parotid mass, present dating back to at least 2014 and likely reflecting a benign parotid neoplasm. ASPECTS [facility] Stroke Program Early CT Score) - Ganglionic level infarction (caudate, lentiform nuclei, internal capsule, insula, M1-M3 cortex): 7 - Supraganglionic infarction (M4-M6 cortex): 3 Total score (0-10 with 10 being normal): 10 IMPRESSION: 1. No evidence of acute intracranial abnormality. 2. ASPECTS is 10. 3. Moderate chronic small vessel ischemic disease. These results were communicated to Dr. Cheral Marker at 5:18 pm on 03/29/2020 by text page via the [facility] messaging system. Electronically Signed   By: Logan Bores M.D.   On: 03/29/2020 17:18    Procedures Procedures (including critical care time)  Medications Ordered in ED Medications  sodium chloride flush (NS) 0.9 % injection 3 mL (has no administration in time range)  atorvastatin (LIPITOR) tablet 40  mg (40 mg Oral Given 03/29/20 2255)  metoprolol tartrate (LOPRESSOR) tablet 100 mg (100 mg Oral Given 03/29/20 2255)  FLUoxetine (PROZAC) capsule 10 mg (has no administration in time range)  pantoprazole (PROTONIX) EC tablet 40 mg (has no administration in time range)  senna-docusate (Senokot-S) tablet 1 tablet (has no administration in time range)  sevelamer carbonate (RENVELA) tablet 800 mg (has no administration in time range)  sodium bicarbonate tablet 1,300 mg (1,300 mg Oral Given 03/29/20 2301)   stroke: mapping our early stages of recovery book (has no administration in time range)    acetaminophen (TYLENOL) tablet 650 mg (has no administration in time range)    Or  acetaminophen (TYLENOL) 160 MG/5ML solution 650 mg (has no administration in time range)    Or  acetaminophen (TYLENOL) suppository 650 mg (has no administration in time range)    ED Course  I have reviewed the triage vital signs and the nursing notes.  Pertinent labs & imaging results that were available during my care of the patient were reviewed by me and considered in my medical decision making (see chart for details).    MDM Rules/Calculators/A&P                          Head imaging with no acute stroke.  No TPA at this time, neurology will follow up tomorrow morning.   Patient just had full dialysis session prior to arrival.  Labs are unremarkable in that setting.  Mild leukocytosis.  Chest x-ray unremarkable.  Patient is not at baseline and requires admission for further work-up and management of possible infectious or metabolic causes of her encephalopathy.  Patient was seen with Dr. Darl Householder. Final Clinical Impression(s) / ED Diagnoses Final diagnoses:  Dysarthria  Altered mental status, unspecified altered mental status type    Rx / DC Orders ED Discharge Orders    None       Asencion Noble, MD 03/29/20 2350    Drenda Freeze, MD 03/30/20 [phone]

## 2020-03-29 NOTE — ED Triage Notes (Signed)
Pt arrived via [facility] from dialysis appt. Dialysis reported that she walked into her appt completley normal but at 15:00 began to have slurred speech, weakness, and altered mental status. They continued dialysis for one more hour and then called EMS.  Per EMS pt had slurred speech, did not know her location, opened her mouth but formed no words. Pt was also reported to have bilateral weakness and arm drift.

## 2020-03-29 NOTE — H&P (Signed)
History and Physical   [name] Michael WCB:[id number] DOB: [birth year] DOA: 03/29/2020  PCP: Charlott Rakes, MD  Patient coming from: Dialysis center  I have personally briefly reviewed patient's old medical records in [facility] EMR  Chief Concern: mental status change   HPI: [name] Michael is a 59 y.o. female with medical history hypertension, hyperlipidemia, diabetes mellitus on insulin, end-stage renal disease on hemodialysis Tuesday Thursday, Saturday, who presented to the ED from dialysis center for chief concern of mental status change.  On evaluation at bedside, [name] Michael reports that towards the end of her dialysis session, she felt weak and confused with reported slurred speech.  At bedside [name] Michael is able to tell me her full name, age, and current location of [facility].  She appears to not have full insight on her medical condition and does not understand why she is living in a nursing home facility. ROS was negative for shortness of breath, chest pain, abdominal pain, nausea, vomiting, headache, vision changes, bowel changes. She reports that she urinates normally despite being on dialysis.  In the ED, CT the head showed no evidence of acute intracranial abnormality with moderate chronic small vessel ischemic disease.   Neurology was consulted and recommended to admit the patient for further evaluation.  ED Course: discussed with attending provider. Ms. Oxendine was completing dialysis when it was noticed she was experiencing mental status change including confusion and slurred speech. ED provider states that neurologist suspect metabolic etiology vs true stroke.   Review of Systems: As per HPI otherwise 10 point review of systems negative.  Past Medical History:  Diagnosis Date  . Ambulates with cane   . Constipation   . Depression   . ESRD (end stage renal disease) ([place])    a. TTS Dialysis.  Marland Kitchen GERD (gastroesophageal reflux disease)   . History of stress test    a.  01/2003 MV: EF 74%, no ischemia/infarct.  . Hyperlipidemia   . Hypertension   . Osteoporosis   . Stroke [facility]) 04-01-11   left frontal subcortical, saw Dr. [name]   . Syncope 11/2019  . TIA (transient ischemic attack) 03-12-11  . Tobacco abuse   . Tricuspid regurgitation    a. 05/2016 Echo: EF 65-70%, Gr2DD, mild MR, nl RV fxn, Triv TR, PASP 2mmHg; b. 05/2018 Echo: EF 60-65%, Gr2DD, mild MR/TR, RVSP/PASP 58mmHg; c. 11/2019 Echo: EF 55-60%, no rwma, mild-mod MR, Sev TR w/ RV dilatation (CTA chest neg for PE).  . Type II diabetes mellitus ([place])   . Vitamin D deficiency    Past Surgical History:  Procedure Laterality Date  . AV FISTULA PLACEMENT Left 02/21/2019   Procedure: BRACHIOCEPHALIC ARTERIOVENOUS (AV) FISTULA CREATION;  Surgeon: [name], MD;  Location: [place];  Service: Vascular;  Laterality: Left;  . BASCILIC VEIN TRANSPOSITION Left 04/11/2019   Procedure: SECOND STAGE BASILIC VEIN TRANSPOSITION LEFT ARM;  Surgeon: [name], MD;  Location: [place];  Service: Vascular;  Laterality: Left;  . IR FLUORO GUIDE CV LINE RIGHT  04/29/2019  . IR US GUIDE VASC ACCESS RIGHT  04/29/2019  . OPEN REDUCTION INTERNAL FIXATION (ORIF) DISTAL RADIAL FRACTURE Left 01/28/2016   Procedure: OPEN REDUCTION INTERNAL FIXATION (ORIF) DISTAL RADIAL FRACTURE;  Surgeon: Iran Planas, MD;  Location: [place];  Service: Orthopedics;  Laterality: Left;  . [place] CATH N/A 12/05/2019   Procedure: RIGHT HEART CATH;  Surgeon: Nelva Bush, MD;  Location: [place] CV LAB;  Service: Cardiovascular;  Laterality: N/A;  . TONSILLECTOMY AND ADENOIDECTOMY     age 59   Social History:  reports that she has been smoking cigarettes. She has a 9.60 pack-year smoking history. She has never used smokeless tobacco. She reports that she does not drink alcohol and does not use drugs.  Allergies  Allergen Reactions  . Hydrocodone Nausea And Vomiting and Other (See  Comments)    "Allergic," per [facility]   Family History  Problem Relation Age of Onset  . Stroke Mother   . Diabetes Mother   . Kidney failure Mother   . Heart failure Mother   . Stroke Father   . Cancer Sister        Breast- 64's   Family history: Family history reviewed and not pertinent  Prior to Admission medications   Medication Sig Start Date End Date Taking? Authorizing Provider  acetaminophen (TYLENOL) 325 MG tablet Take 650 mg by mouth every 4 (four) hours as needed for mild pain.    [provider]  aspirin 81 MG chewable tablet Chew 81 mg by mouth daily.    [provider]  atorvastatin (LIPITOR) 40 MG tablet Take 40 mg by mouth at bedtime.     [provider]  diphenhydrAMINE (BANOPHEN) 25 mg capsule Take 25 mg by mouth every 4 (four) hours as needed for itching or allergies.    [provider]  famotidine (PEPCID) 10 MG tablet Take 10 mg by mouth daily.    [provider]  FLUoxetine (PROZAC) 10 MG capsule Take 10 mg by mouth daily.    [provider]  gabapentin (NEURONTIN) 100 MG capsule Take 1 capsule (100 mg total) by mouth 2 (two) times daily. 05/04/19 02/20/20  [name] Ghazi, Pratik D, DO  insulin lispro (HUMALOG) 100 UNIT/ML injection Inject 12 Units into the skin 3 (three) times daily with meals. Hold for BGL <150 or if patient is not eating.    [provider]  loratadine (CLARITIN) 10 MG tablet Take 10 mg by mouth daily.    [provider]  metoprolol tartrate (LOPRESSOR) 100 MG tablet Take 100 mg by mouth 2 (two) times daily.    [provider]  Multiple Vitamins-Minerals (CERTAVITE SENIOR/ANTIOXIDANT) TABS Take 1 tablet by mouth daily.    [provider]  omeprazole (PRILOSEC) 40 MG capsule Take 40 mg by mouth daily.    [provider]  OVER THE COUNTER MEDICATION Wild cherry 30 Mls daily by mouth    [provider]  polyethylene glycol powder (GAVILAX) 17 GM/SCOOP powder  Take 17 g by mouth See admin instructions. Mix 17 grams of powder into 8 ounces of fluid, stir, and drink (by mouth) once a day    [provider]  SACCHAROMYCES BOULARDII PO Take 1 capsule by mouth 2 (two) times daily.    [provider]  senna-docusate (SENOKOT-S) 8.6-50 MG tablet Take 1 tablet by mouth at bedtime as needed for mild constipation. 06/11/18   Regalado, [name], MD  sevelamer carbonate (RENVELA) 800 MG tablet Take 800 mg by mouth 3 (three) times daily with meals.    [provider]  sodium bicarbonate 650 MG tablet Take 1,300 mg by mouth 2 (two) times daily.    [provider]  Vitamin D, Ergocalciferol, (DRISDOL) 50000 units CAPS capsule Take 1 capsule (50,000 Units total) by mouth every 7 (seven) days. Patient taking differently: Take 50,000 Units by mouth every Wednesday.  11/21/16   Jonetta Osgood, MD   Physical Exam: Vitals:   03/29/20 1915 03/29/20 2237 03/29/20 2251 03/30/20 0300  BP: (!) 198/77 138/80 (!) 163/63 (!) 162/59  Pulse: 80 81 79 71  Resp: 13 17 15 16   Temp:      TempSrc:      SpO2: 96% 92% 91% 92%  Weight:      Height:       Constitutional: NAD, calm, comfortable Eyes: PERRL, lids and conjunctivae normal ENMT: Mucous membranes are moist. Posterior pharynx clear of any exudate or lesions.Normal dentition.  Neck: short, supple, no masses, no thyromegaly Respiratory: clear to auscultation bilaterally, no wheezing, no crackles. Normal respiratory effort. No accessory muscle use.  Cardiovascular: Regular rate and rhythm, no murmurs / rubs / gallops. No extremity edema. 2+ pedal pulses. No carotid bruits.  Abdomen: no tenderness, no masses palpated. No hepatosplenomegaly. Bowel sounds positive.  Musculoskeletal: Left upper ext AV fistula appears clean with appropriate bruit. no clubbing / cyanosis. No joint deformity upper and lower extremities. Good ROM, no contractures. Normal muscle tone.  Skin: no rashes, lesions,  ulcers. No induration Neurologic: CN 2-12 grossly intact. Sensation intact, DTR normal. Strength 5/5 in all 4.  Psychiatric: Normal judgment and insight. Alert and oriented x 3. Normal mood.   Labs on Admission: I have personally reviewed following labs and imaging studies  CBC: Recent Labs  Lab 03/29/20 1644 03/29/20 1646  WBC 11.6*  --   NEUTROABS 9.1*  --   HGB 8.8* 9.5*  HCT 29.3* 28.0*  MCV 99.0  --   PLT 234  --    Basic Metabolic Panel: Recent Labs  Lab 03/29/20 1644 03/29/20 1646  NA 141 139  K 3.3* 3.4*  CL 98 95*  CO2 31  --   GLUCOSE 109* 107*  BUN 7 8  CREATININE 1.77* 1.70*  CALCIUM 7.8*  --    GFR: Estimated Creatinine Clearance: 21.9 mL/min (A) (by C-G formula based on SCr of 1.7 mg/dL (H)). Liver Function Tests: Recent Labs  Lab 03/29/20 1644  AST 15  ALT 12  ALKPHOS 82  BILITOT 0.3  PROT 7.0  ALBUMIN 3.5   No results for input(s): LIPASE, AMYLASE in the last 168 hours. No results for input(s): AMMONIA in the last 168 hours. Coagulation Profile: Recent Labs  Lab 03/29/20 1644  INR 1.0   Cardiac Enzymes: No results for input(s): CKTOTAL, CKMB, CKMBINDEX, TROPONINI in the last 168 hours. BNP (last 3 results) No results for input(s): PROBNP in the last 8760 hours. HbA1C: No results for input(s): HGBA1C in the last 72 hours. CBG: Recent Labs  Lab 03/29/20 1642  GLUCAP 105*   Lipid Profile: No results for input(s): CHOL, HDL, LDLCALC, TRIG, CHOLHDL, LDLDIRECT in the last 72 hours. Thyroid Function Tests: No results for input(s): TSH, T4TOTAL, FREET4, T3FREE, THYROIDAB in the last 72 hours. Anemia Panel: No results for input(s): VITAMINB12, FOLATE, FERRITIN, TIBC, IRON, RETICCTPCT in the last 72 hours. Urine analysis:    Component Value Date/Time   COLORURINE YELLOW 12/02/2019 1700   APPEARANCEUR CLEAR 12/02/2019 1700   LABSPEC 1.005 12/02/2019 1700   PHURINE 8.0 12/02/2019 1700   GLUCOSEU >=500 (A) 12/02/2019 1700   HGBUR  NEGATIVE 12/02/2019 1700   BILIRUBINUR NEGATIVE 12/02/2019 1700   BILIRUBINUR n 05/25/2012 1202   KETONESUR NEGATIVE 12/02/2019 1700   PROTEINUR 100 (A) 12/02/2019 1700   UROBILINOGEN 0.2 05/25/2012 1202   UROBILINOGEN 0.2 03/29/2011 0131   NITRITE NEGATIVE 12/02/2019  [place] 12/02/2019 1700   Radiological Exams on Admission: Personally reviewed and I agree with radiologist reading as below.  MR ANGIO HEAD WO CONTRAST  Result Date: 03/29/2020 CLINICAL DATA:  Acute onset of altered mental status during dialysis. Slurred speech and somnolence. EXAM: MRI HEAD WITHOUT CONTRAST MRA HEAD WITHOUT CONTRAST TECHNIQUE: Multiplanar, multiecho pulse sequences of the brain and surrounding structures were obtained without intravenous contrast. Angiographic images of the head were obtained using MRA technique without contrast. COMPARISON:  Head CT 03/29/2020, MRI 03/28/2011, and MRA 03/13/2011 FINDINGS: MRI HEAD FINDINGS The study is mildly motion degraded. Brain: There is no evidence of an acute infarct, intracranial hemorrhage, mass, midline shift, or extra-axial fluid collection. Patchy T2 hyperintensities in the cerebral white matter, left greater than right, have progressed from the prior MRI and are nonspecific but compatible with moderate chronic small vessel ischemic disease. Chronic lacunar infarcts are noted in the basal ganglia, thalami, and left internal capsule. Mild chronic small vessel changes are present in the pons. Mild cerebral atrophy is within normal limits for age. Vascular: Major intracranial vascular flow voids are preserved. Skull and upper cervical spine: Unremarkable bone marrow signal. Sinuses/Orbits: Unremarkable orbits. Paranasal sinuses and mastoid air cells are clear. Other: 2.1 x 1.5 cm right parotid mass, unchanged in size from 2012. MRA HEAD FINDINGS The study is moderately motion degraded. The visualized distal right vertebral artery is widely patent and  supplies the basilar. There is absent flow related enhancement in the included distal V3 and proximal V4 segments of the left vertebral artery which represents a change from the 2012 MRA. Flow related enhancement in the more distal left V4 segment may be retrograde. Patent right AICA and bilateral SCA origins are identified. PICAs and a left AICA are not clearly identified with assessment limited by motion. The basilar artery is patent with mild diffuse irregularity attributed to motion and no evidence of a flow limiting stenosis. Posterior communicating arteries are not identified and may be small or absent. Both PCAs are patent with distal branch vessel irregularity but no significant proximal stenosis. The internal carotid arteries are patent from skull base to carotid termini. Both distal cervical ICAs are tortuous. There are mild left cavernous and mild right and moderate left proximal supraclinoid ICA stenoses versus artifact. ACAs and MCAs are patent with branch vessel assessment limited by motion. There is a severe left A1 origin stenosis, and there may also be moderate to severe mid and distal left A1 stenoses versus artifact. No flow limiting stenosis is evident of the right A1 segment or either M1 segment. No aneurysm is identified. IMPRESSION: 1. No acute intracranial abnormality. 2. Moderate chronic small vessel ischemic disease, progressed from 2012. 3. Motion degraded head MRA without a large vessel occlusion in the anterior circulation. 4. Absent flow related enhancement in the distal V3 and proximal V4 segments of the left vertebral artery, new from 2012 and which may reflect a proximal occlusion or severe stenosis with reduced flow. 5. Severe left A1 stenosis. 6. Mild right and moderate left ICA stenoses versus artifact. 7. 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 Electronically Signed   By: Logan Bores M.D.   On: 03/29/2020 18:16   MR  BRAIN WO CONTRAST  Result Date: 03/29/2020 CLINICAL DATA:  Acute onset of altered mental status during dialysis. Slurred speech and somnolence. EXAM: MRI HEAD WITHOUT CONTRAST MRA HEAD WITHOUT CONTRAST TECHNIQUE: Multiplanar, multiecho pulse sequences  of the brain and surrounding structures were obtained without intravenous contrast. Angiographic images of the head were obtained using MRA technique without contrast. COMPARISON:  Head CT 03/29/2020, MRI 03/28/2011, and MRA 03/13/2011 FINDINGS: MRI HEAD FINDINGS The study is mildly motion degraded. Brain: There is no evidence of an acute infarct, intracranial hemorrhage, mass, midline shift, or extra-axial fluid collection. Patchy T2 hyperintensities in the cerebral white matter, left greater than right, have progressed from the prior MRI and are nonspecific but compatible with moderate chronic small vessel ischemic disease. Chronic lacunar infarcts are noted in the basal ganglia, thalami, and left internal capsule. Mild chronic small vessel changes are present in the pons. Mild cerebral atrophy is within normal limits for age. Vascular: Major intracranial vascular flow voids are preserved. Skull and upper cervical spine: Unremarkable bone marrow signal. Sinuses/Orbits: Unremarkable orbits. Paranasal sinuses and mastoid air cells are clear. Other: 2.1 x 1.5 cm right parotid mass, unchanged in size from 2012. MRA HEAD FINDINGS The study is moderately motion degraded. The visualized distal right vertebral artery is widely patent and supplies the basilar. There is absent flow related enhancement in the included distal V3 and proximal V4 segments of the left vertebral artery which represents a change from the 2012 MRA. Flow related enhancement in the more distal left V4 segment may be retrograde. Patent right AICA and bilateral SCA origins are identified. PICAs and a left AICA are not clearly identified with assessment limited by motion. The basilar artery is patent  with mild diffuse irregularity attributed to motion and no evidence of a flow limiting stenosis. Posterior communicating arteries are not identified and may be small or absent. Both PCAs are patent with distal branch vessel irregularity but no significant proximal stenosis. The internal carotid arteries are patent from skull base to carotid termini. Both distal cervical ICAs are tortuous. There are mild left cavernous and mild right and moderate left proximal supraclinoid ICA stenoses versus artifact. ACAs and MCAs are patent with branch vessel assessment limited by motion. There is a severe left A1 origin stenosis, and there may also be moderate to severe mid and distal left A1 stenoses versus artifact. No flow limiting stenosis is evident of the right A1 segment or either M1 segment. No aneurysm is identified. IMPRESSION: 1. No acute intracranial abnormality. 2. Moderate chronic small vessel ischemic disease, progressed from 2012. 3. Motion degraded head MRA without a large vessel occlusion in the anterior circulation. 4. Absent flow related enhancement in the distal V3 and proximal V4 segments of the left vertebral artery, new from 2012 and which may reflect a proximal occlusion or severe stenosis with reduced flow. 5. Severe left A1 stenosis. 6. Mild right and moderate left ICA stenoses versus artifact. 7. 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 Electronically Signed   By: Logan Bores M.D.   On: 03/29/2020 18:16   DG Chest Port 1 View  Result Date: 03/29/2020 CLINICAL DATA:  Change in mental status EXAM: PORTABLE CHEST 1 VIEW COMPARISON:  None. FINDINGS: The heart size and mediastinal contours are within normal limits. Both lungs are clear. The visualized skeletal structures are unremarkable. IMPRESSION: No active disease. Electronically Signed   By: Prudencio Pair M.D.   On: 03/29/2020 19:25   CT HEAD CODE STROKE WO CONTRAST  Result Date:  03/29/2020 CLINICAL DATA:  Code stroke. Sudden onset altered mental status with slurred speech. EXAM: CT HEAD WITHOUT CONTRAST TECHNIQUE: Contiguous axial images were obtained  from the base of the skull through the vertex without intravenous contrast. COMPARISON:  12/02/2019 FINDINGS: Brain: There is no evidence of an acute infarct, intracranial hemorrhage, mass, midline shift, or extra-axial fluid collection. Mild cerebral atrophy is within normal limits for age. Patchy hypodensities in the cerebral white matter bilaterally are unchanged and nonspecific but compatible with chronic small vessel ischemic disease which is moderate for age. Chronic lacunar infarcts are again noted in the bilateral basal ganglia and thalami. Vascular: Calcified atherosclerosis at the skull base. Skull: No fracture or suspicious osseous lesion. Sinuses/Orbits: Paranasal sinuses and mastoid air cells are clear. Unremarkable orbits. Other: Incompletely imaged 1.6 cm right parotid mass, present dating back to at least 2014 and likely reflecting a benign parotid neoplasm. ASPECTS [facility] Stroke Program Early CT Score) - Ganglionic level infarction (caudate, lentiform nuclei, internal capsule, insula, M1-M3 cortex): 7 - Supraganglionic infarction (M4-M6 cortex): 3 Total score (0-10 with 10 being normal): 10 IMPRESSION: 1. No evidence of acute intracranial abnormality. 2. ASPECTS is 10. 3. Moderate chronic small vessel ischemic disease. These results were communicated to Dr. Cheral Marker at 5:18 pm on 03/29/2020 by text page via the [facility] messaging system. Electronically Signed   By: Logan Bores M.D.   On: 03/29/2020 17:18   EKG: Independently reviewed, showing sinus rhythm with RBBB  Assessment/Plan Principal Problem:   Change in mental status Active Problems:   Essential hypertension   CKD (chronic kidney disease) stage 5, GFR less than 15 ml/min (HCC)   Type II diabetes mellitus ([place])   Stroke (cerebrum) ([place])   Ms. Lehew is a 59  year old female who ambulates with rollator walker at baseline, with end-stage renal disease on hemodialysis, hypertension, hyperlipidemia, insulin-dependent diabetes mellitus, presented from hemodialysis center for chief concern of mental status change.  # Mental status change - suspect hypoglycemia vs cva - Ms. Lundeen has diagnosis of IDDM (last a1c was 11/2019 showing 8.8) and has short acting insulin, lispro at 12 units TID with meals. I will recheck A1c and would recommendation consideration of transitioning patient to long acting insulin for blood glucose control - Holding gabapentin and diphenhydramine - PT/OT - Fall precautions - Neurology is following - Continue telemetry - Admit to observation  # DM on insulin - check A1c, bmp - holding home insulin regimen of lispro 12 units TID - NPO pending speech evaluation  # ESRD on HD [facility] via left AV fistula - completed session on 03/29/20 prior to arrival to ED - Day team to resume - Resumed sevelamer 800 mg TID, sodium bicarbonate 1300 mg BID  # Constipation prevention - senna-docusate   DVT prophylaxis: pending neuro assessment Code Status: DNR Family Communication: updated sister listed in chart Disposition Plan: pending clinical course Consults called: neurology is following Admission status: observation  Grayling Schranz N Rutger Salton D.O. [facility]  If 7AM-7PM, please contact day-coverage [email or web address]  03/30/2020, 3:17 AM

## 2020-03-29 NOTE — ED Notes (Signed)
Code stroke cancelled 

## 2020-03-29 NOTE — Consult Note (Signed)
NEURO HOSPITALIST CONSULT NOTE   Requestig physician: Dr. Darl Householder  Reason for Consult: AMS during dialysis, called as a Code Stroke by EMS  History obtained from:  EMS and Chart     HPI:                                                                                                                                          [name] Michael is an 59 y.o. female with ESRD on HD, depression, HLD, HTN, osteoporosis, left frontal subcortical stroke, TIA, tobacco abuse, DM2 and vitamin D deficiency, who presents acutely via EMS from her hemodialysis center for acute onset of AMS. She was reportedly at her normal baseline when she arrived at the dialysis center. During dialysis she began to exhibit somnolence and slurred speech. The dialysis session was continued for another hour and then EMS was called. On arrival she was lethargic with dysarthria and disorientation - she was unable to state where she was and also was disoriented to time. No focal weakness was noted by EMS. She did not exhibit any seizure-like activity per EMS. On arrival to the ED she continued to exhibit AMS and was lethargic/somnolent.   Home meds include ASA and atorvastatin.   Past Medical History:  Diagnosis Date   Ambulates with cane    Constipation    Depression    ESRD (end stage renal disease) ([place])    a. TTS Dialysis.   GERD (gastroesophageal reflux disease)    History of stress test    a. 01/2003 MV: EF 74%, no ischemia/infarct.   Hyperlipidemia    Hypertension    Osteoporosis    Stroke [facility]) 04-01-11   left frontal subcortical, saw Dr. [name]    Syncope 11/2019   TIA (transient ischemic attack) 03-12-11   Tobacco abuse    Tricuspid regurgitation    a. 05/2016 Echo: EF 65-70%, Gr2DD, mild MR, nl RV fxn, Triv TR, PASP 9mmHg; b. 05/2018 Echo: EF 60-65%, Gr2DD, mild MR/TR, RVSP/PASP 56mmHg; c. 11/2019 Echo: EF 55-60%, no rwma, mild-mod MR, Sev TR w/ RV dilatation (CTA chest neg for PE).    Type II diabetes mellitus ([place])    Vitamin D deficiency     Past Surgical History:  Procedure Laterality Date   AV FISTULA PLACEMENT Left 02/21/2019   Procedure: BRACHIOCEPHALIC ARTERIOVENOUS (AV) FISTULA CREATION;  Surgeon: [name], MD;  Location: [place];  Service: Vascular;  Laterality: Left;   [place] Left 04/11/2019   Procedure: SECOND STAGE BASILIC VEIN TRANSPOSITION LEFT ARM;  Surgeon: [name], MD;  Location: [place];  Service: Vascular;  Laterality: Left;   IR FLUORO GUIDE CV LINE RIGHT  04/29/2019   IR US GUIDE VASC ACCESS RIGHT  04/29/2019   OPEN REDUCTION INTERNAL  FIXATION (ORIF) DISTAL RADIAL FRACTURE Left 01/28/2016   Procedure: OPEN REDUCTION INTERNAL FIXATION (ORIF) DISTAL RADIAL FRACTURE;  Surgeon: Iran Planas, MD;  Location: [place];  Service: Orthopedics;  Laterality: Left;   [place]   RIGHT HEART CATH N/A 12/05/2019   Procedure: RIGHT HEART CATH;  Surgeon: Nelva Bush, MD;  Location: [place] CV [facility];  Service: Cardiovascular;  Laterality: N/A;   TONSILLECTOMY AND ADENOIDECTOMY     age 76    Family History  Problem Relation Age of Onset   Stroke Mother    Diabetes Mother    Kidney failure Mother    Heart failure Mother    Stroke Father    Cancer Sister        Breast- 66's              Social History:  reports that she has been smoking cigarettes. She has a 9.60 pack-year smoking history. She has never used smokeless tobacco. She reports that she does not drink alcohol and does not use drugs.  Allergies  Allergen Reactions   Hydrocodone Nausea And Vomiting    Listed in Epic, but not on the [facility]    HOME MEDICATIONS:                                                                                                                      No current facility-administered medications on file prior to encounter.   Current Outpatient Medications on File Prior to Encounter   Medication Sig Dispense Refill   acetaminophen (TYLENOL) 325 MG tablet Take 650 mg by mouth every 4 (four) hours as needed for mild pain.     aspirin 81 MG chewable tablet Chew 81 mg by mouth daily.     atorvastatin (LIPITOR) 40 MG tablet Take 40 mg by mouth at bedtime.      diphenhydrAMINE (BANOPHEN) 25 mg capsule Take 25 mg by mouth every 4 (four) hours as needed for itching or allergies.     famotidine (PEPCID) 10 MG tablet Take 10 mg by mouth daily.     FLUoxetine (PROZAC) 10 MG capsule Take 10 mg by mouth daily.     gabapentin (NEURONTIN) 100 MG capsule Take 1 capsule (100 mg total) by mouth 2 (two) times daily. 60 capsule 0   insulin lispro (HUMALOG) 100 UNIT/ML injection Inject 12 Units into the skin 3 (three) times daily with meals. Hold for BGL <150 or if patient is not eating.     loratadine (CLARITIN) 10 MG tablet Take 10 mg by mouth daily.     metoprolol tartrate (LOPRESSOR) 100 MG tablet Take 100 mg by mouth 2 (two) times daily.     Multiple Vitamins-Minerals (CERTAVITE SENIOR/ANTIOXIDANT) TABS Take 1 tablet by mouth daily.     omeprazole (PRILOSEC) 40 MG capsule Take 40 mg by mouth daily.     OVER THE COUNTER MEDICATION Wild cherry 30 Mls daily by mouth     polyethylene glycol  powder (GAVILAX) 17 GM/SCOOP powder Take 17 g by mouth See admin instructions. Mix 17 grams of powder into 8 ounces of fluid, stir, and drink (by mouth) once a day     SACCHAROMYCES BOULARDII PO Take 1 capsule by mouth 2 (two) times daily.     senna-docusate (SENOKOT-S) 8.6-50 MG tablet Take 1 tablet by mouth at bedtime as needed for mild constipation. 30 tablet 0   sevelamer carbonate (RENVELA) 800 MG tablet Take 800 mg by mouth 3 (three) times daily with meals.     sodium bicarbonate 650 MG tablet Take 1,300 mg by mouth 2 (two) times daily.     Vitamin D, Ergocalciferol, (DRISDOL) 50000 units CAPS capsule Take 1 capsule (50,000 Units total) by mouth every 7 (seven) days. (Patient taking  differently: Take 50,000 Units by mouth every Wednesday. ) 30 capsule 0     ROS:                                                                                                                                       As per HPI. Unable to obtain a detailed ROS due to AMS.   Weight 50 kg, last menstrual period 03/06/2013.   General Examination:                                                                                                       Physical Exam  HEENT-  [place]/AT. [place]-facies noted. Decreased oral hydration is noted.  Lungs-Respirations unlabored Abdomen- Round Extremities- No pallor or cyanosis  Neurological Examination Mental Status: Lethargic/somnolent. Decreased level of alertness when aroused. Requires constant gentle sternal rub to remain awake and alert enough to participate in exam. When fully aroused, speech is fluent but mildly dysarthric and increased latencies of verbal and motor responses are also noted. Able to follow simple commands. Naming intact.  Cranial Nerves: II: After multiple trials in the context of her AMS, her temporal visual fields are determined to be intact bilaterally. PERRL. ,  III,IV, VI: Bilateral ptosis in the context of somnolence. EOM are full but slow. No nystagmus. Mild exotropia.  V,VII: Smile symmetric, facial temp sensation equal bilaterally VIII: hearing intact to voice IX,X: No hypophonia XI: Head is midline XII: midline tongue extension Motor: BUE and BLE 4+/5 without asymmetry Able to hold all limbs elevated without drift if an aroused state is maintained with sustained sternal rub.  Sensory: Intact to FT and noxious stimuli x 4.  Deep Tendon Reflexes: 3+ bilateral  brachioradialis and patellae.  Plantars: Tonically upgoing bilaterally.  Cerebellar: No ataxia with FNF. There is dysmetria bilaterally with slow movements when she starts to fall asleep, but can perform without dysmetria during sustained sternal rub.  Gait: Unable to  assess due to falls risk concerns   Lab Results: Basic Metabolic Panel: Recent Labs  Lab 03/29/20 1646  NA 139  K 3.4*  CL 95*  GLUCOSE 107*  BUN 8  CREATININE 1.70*    CBC: Recent Labs  Lab 03/29/20 1644 03/29/20 1646  WBC 11.6*  --   NEUTROABS 9.1*  --   HGB 8.8* 9.5*  HCT 29.3* 28.0*  MCV 99.0  --   PLT 234  --     Cardiac Enzymes: No results for input(s): CKTOTAL, CKMB, CKMBINDEX, TROPONINI in the last 168 hours.  Lipid Panel: No results for input(s): CHOL, TRIG, HDL, CHOLHDL, VLDL, LDLCALC in the last 168 hours.  Imaging: No results found.  Assessment: 59 y.o. female with ESRD on HD, depression, HLD, HTN, osteoporosis, left frontal subcortical stroke, TIA, tobacco abuse, DM2 and vitamin D deficiency, who presents acutely via EMS from her hemodialysis center for acute onset of AMS.  1. Exam reveals no facial droop, aphasia, lateralized weakness or focal sensory deficit.  2. CT head negative for acute abnormality. Chronic small vessel ischemic changes and old lacunar infarctions are noted.  3. DDx: The most likely etiology for the patient's presentation is a toxic, infectious or metabolic encephalopathy. Overall findings do not militate strongly in favor of stroke. Will obtain STAT MRI brain with MRA head to further evaluate.   Recommendations: 1. STAT MRI brain and MRA of head.  2. Frequent neuro checks 3. Toxic/metabolic/infectious work up.    Electronically signed: Dr. Kerney Elbe 03/29/2020, 5:00 PM

## 2020-03-29 NOTE — Code Documentation (Signed)
Stroke Response Nurse Documentation Code Documentation  [name] Michael is a 59 y.o. female arriving to [place] ED via [facility] on 03/29/2020 with past medical hx of HTN, HLD, CVA (bilat basal ganglia in 04/2019), TIA, DM. Code stroke was activated by EMS. Patient from dialysis where she was LKW at 1500 and now complaining of AMS and dysarthria. On aspirin 81 mg daily. Stroke team at the bedside on patient arrival. Labs drawn and patient cleared for CT by Dr. Darl Householder. Patient to CT with team. NIHSS 7, see documentation for details and code stroke times. Patient with decreased LOC, bilateral arm weakness, right leg weakness, Expressive aphasia , dysarthria  and Sensory  neglect on exam. The following imaging was completed: CT, STAT MRI. Patient is not a candidate for tPA due to no stroke present on imaging per Dr. Cheral Marker. Canceled code stroke per Dr. Cheral Marker. Medical work-up. Bedside handoff with ED RN Annie Main.    [place]  Stroke Response RN

## 2020-03-30 ENCOUNTER — Encounter (HOSPITAL_COMMUNITY): Payer: Self-pay | Admitting: Internal Medicine

## 2020-03-30 DIAGNOSIS — I10 Essential (primary) hypertension: Secondary | ICD-10-CM | POA: Diagnosis not present

## 2020-03-30 DIAGNOSIS — R4182 Altered mental status, unspecified: Secondary | ICD-10-CM | POA: Diagnosis not present

## 2020-03-30 DIAGNOSIS — E1122 Type 2 diabetes mellitus with diabetic chronic kidney disease: Secondary | ICD-10-CM | POA: Diagnosis not present

## 2020-03-30 DIAGNOSIS — Z992 Dependence on renal dialysis: Secondary | ICD-10-CM | POA: Diagnosis not present

## 2020-03-30 DIAGNOSIS — N186 End stage renal disease: Secondary | ICD-10-CM | POA: Diagnosis not present

## 2020-03-30 LAB — HEMOGLOBIN A1C
Hgb A1c MFr Bld: 7 % — ABNORMAL HIGH (ref 4.8–5.6)
Mean Plasma Glucose: 154.2 mg/dL

## 2020-03-30 LAB — LIPID PANEL
Cholesterol: 122 mg/dL (ref 0–200)
HDL: 39 mg/dL — ABNORMAL LOW (ref >40)
LDL Cholesterol: 26 mg/dL (ref 0–99)
Total CHOL/HDL Ratio: 3.1 RATIO
Triglycerides: 285 mg/dL — ABNORMAL HIGH (ref <150)
VLDL: 57 mg/dL — ABNORMAL HIGH (ref 0–40)

## 2020-03-30 NOTE — NC FL2 (Signed)
[place] MEDICAID FL2 LEVEL OF CARE SCREENING TOOL     IDENTIFICATION  Patient Name: [name] Michael Birthdate: 03/20/1961 Sex: female Admission Date (Current Location): 03/29/2020  [facility] and [place] Number:  Herbalist and Address:  [facility], [place] [address], [place], [place] [address]      Provider Number: [id number]  Attending Physician Name and Address:  Aline August, MD  Relative Name and Phone Number:       Current Level of Care: Hospital Recommended Level of Care: [place] Prior Approval Number:    Date Approved/Denied:   PASRR Number: [id number]  Discharge Plan: SNF    Current Diagnoses: Patient Active Problem List   Diagnosis Date Noted  . Change in mental status 03/30/2020  . Stroke (cerebrum) ([place]) 03/29/2020  . Hypoxia   . Acute on chronic right-sided heart failure ([place])   . Severe tricuspid regurgitation 12/04/2019  . ESRD (end stage renal disease) ([place])   . GERD (gastroesophageal reflux disease)   . Hypertension   . Type II diabetes mellitus ([place])   . Weakness   . Syncope 12/03/2019  . Wrist pain, acute, right   . 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 AMS (altered mental status) 04/28/2019  . Hypoxemia 04/28/2019  . Fall 06/09/2018  . Fall at home, initial encounter 06/09/2018  . Anemia of chronic disease 06/09/2018  . Syncope and collapse 06/09/2018  . CKD (chronic kidney disease) stage 5, GFR less than 15 ml/min (HCC) 03/10/2018  . Acute encephalopathy 03/10/2018  . Hypermagnesemia 03/10/2018  . AKI (acute kidney injury) ([place]) 11/09/2016  . Hypercalcemia 11/09/2016  . Hyponatremia 11/09/2016  . Hypovolemia 11/09/2016  . Accelerated hypertension 11/09/2016  . Acute lower UTI 11/09/2016  . Nephrolithiasis 10/24/2016  . Constipation 10/22/2016  . CAP (community acquired pneumonia) 10/22/2016  . Hydronephrosis of right kidney 10/22/2016  . Metabolic acidosis 62/83/6629  . CVA  (cerebral vascular accident) ([place]) 07/03/2016  . HAP (hospital-acquired pneumonia) 07/03/2016  . Sepsis secondary to UTI ([place])   . UTI due to extended-spectrum beta lactamase (ESBL) producing Escherichia coli   . Acute pyelonephritis   . Bacteremia due to Escherichia coli   . Colitis, indeterminate   . Uncontrolled type 2 diabetes mellitus with complication ([place])   . 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 associated with diabetes mellitus due to underlying condition ([place])   . Chronic diastolic CHF (congestive heart failure) ([place])   . Abdominal pain   . Colitis   . Hypophosphatemia 06/12/2016  . Hypokalemia 06/11/2016  . Hypocalcemia 06/11/2016  . Acute kidney injury superimposed on chronic kidney disease ([place]) 06/11/2016  . Proliferative diabetic retinopathy ([place]) 04/29/2016  . Closed fracture of left distal radius 01/28/2016  . Poor social situation 03/09/2013  . Depression 03/01/2013  . Abnormal mammogram 12/20/2012  . Retinal detachment 11/17/2012  . 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 ([place]) 47/65/4650  . Hyperlipidemia 02/09/2007  . Essential hypertension 02/09/2007  . GERD 02/09/2007    Orientation RESPIRATION BLADDER Height & Weight     Self, Time, Place  Normal Continent Weight: 50 kg Height:  4\' 6"  (137.2 cm)  BEHAVIORAL SYMPTOMS/MOOD NEUROLOGICAL BOWEL NUTRITION STATUS      Continent Diet (renal/carb mod with thin liquid)  AMBULATORY STATUS COMMUNICATION OF NEEDS Skin   Limited Assist Verbally Normal  Personal Care Assistance Level of Assistance  Bathing, Feeding, Dressing Bathing Assistance: Maximum assistance Feeding assistance: Limited assistance Dressing Assistance: Maximum assistance     Functional Limitations Info  Sight, Hearing, Speech Sight Info: Adequate Hearing Info: Adequate Speech Info: Impaired    SPECIAL CARE FACTORS FREQUENCY                        Contractures Contractures Info: Not present    Additional Factors Info  Code Status, Allergies, Psychotropic Code Status Info: DNR Allergies Info: Hydrocodone Psychotropic Info: Prozac 10 mg daily         Current Medications (03/30/2020):  This is the current hospital active medication list Current Facility-Administered Medications  Medication Dose Route Frequency Provider Last Rate Last Admin  .  stroke: mapping our early stages of recovery book   Does not apply Once Cox, Amy N, DO      . acetaminophen (TYLENOL) tablet 650 mg  650 mg Oral Q4H PRN Cox, Amy N, DO       Or  . acetaminophen (TYLENOL) 160 MG/5ML solution 650 mg  650 mg Per Tube Q4H PRN Cox, Amy N, DO       Or  . acetaminophen (TYLENOL) suppository 650 mg  650 mg Rectal Q4H PRN Cox, Amy N, DO      . atorvastatin (LIPITOR) tablet 40 mg  40 mg Oral QHS Cox, Amy N, DO   40 mg at 03/29/20 2255  . FLUoxetine (PROZAC) capsule 10 mg  10 mg Oral Daily Cox, Amy N, DO   10 mg at 03/30/20 1118  . metoprolol tartrate (LOPRESSOR) tablet 100 mg  100 mg Oral BID Cox, Amy N, DO   100 mg at 03/30/20 1100  . pantoprazole (PROTONIX) EC tablet 40 mg  40 mg Oral Daily Cox, Amy N, DO   40 mg at 03/30/20 1100  . senna-docusate (Senokot-S) tablet 1 tablet  1 tablet Oral QHS PRN Cox, Amy N, DO      . sevelamer carbonate (RENVELA) tablet 800 mg  800 mg Oral TID WC Cox, Amy N, DO   800 mg at 03/30/20 0900  . sodium bicarbonate tablet 1,300 mg  1,300 mg Oral BID Cox, Amy N, DO   1,300 mg at 03/30/20 1100  . sodium chloride flush (NS) 0.9 % injection 3 mL  3 mL Intravenous Once Cox, Amy N, DO         Discharge Medications: Please see discharge summary for a list of discharge medications.  Relevant Imaging Results:  Relevant Lab Results:   Additional Information [id number]  Pollie Friar, RN

## 2020-03-30 NOTE — Progress Notes (Signed)
OT Cancellation Note  Patient Details Name: OLUWATENIOLA LEITCH MRN: [id number] DOB: 10-07-1960   Cancelled Treatment:    Reason Eval/Treat Not Completed: OT screened, no needs identified, will sign off.  Per chart review it appears pt's symptoms have resolved and plan is to discharge back to facility (SNF) today via PTAR.   Tyrone Schimke, OT Acute Rehabilitation Services Pager: [phone] Office: [phone]  03/30/2020, 1:15 PM

## 2020-03-30 NOTE — Discharge Summary (Signed)
Physician Discharge Summary  [name] Michael ZTI:[id number] DOB: Oct 06, 1960 DOA: 03/29/2020  PCP: Charlott Rakes, MD  Admit date: 03/29/2020 Discharge date: 03/30/2020  Admitted From: SNF Disposition: SNF  Recommendations for Outpatient Follow-up:  1. Follow up with SNF provider at earliest convenience 2. Outpatient follow-up with neurology 3. Outpatient follow-up with dialysis unit as scheduled 4. Follow up in ED if symptoms worsen or new appear   Home Health: No Equipment/Devices: None  Discharge Condition: Stable CODE STATUS: DNR Diet recommendation: Heart healthy/carb modified/renal hemodialysis diet  Brief/Interim Summary: 59 year old female with history of hypertension, hyperlipidemia, diabetes mellitus type 2, end-stage renal disease on hemodialysis presented from dialysis center for concern for mental status change.  She was apparently weak and confused with reported slurred speech at the dialysis unit.  On presentation, CT of the head was negative for acute abnormality.  Neurology was consulted.  MRI of the brain was also negative for acute intracranial [place].  Neurology has cleared the patient for discharge.  Nephrology also has cleared the patient for discharge.  She will be discharged back to SNF.  Discharge Diagnoses:   Altered mental status -Questionable cause.  Might be due to hypoglycemia versus TIA. -CT of the head was negative for acute abnormality.  MRI of the brain was also negative for acute abnormality. -Neurology has evaluated the patient and cleared the patient for discharge.  Mental status is much improved.  Continue to hold Benadryl and gabapentin. -Discharge patient back to SNF.  Diabetes mellitus type 2 -A1c 7.  Blood sugars on the lower side.  Continue CBGs with SSI.  Hold lispro insulin till reevaluation at SNF.  End-stage renal disease on hemodialysis -Patient had dialysis yesterday.  Nephrology has cleared the patient for discharge and patient can  continue outpatient hemodialysis as scheduled.  Discharge Instructions    Allergies as of 03/30/2020      Reactions   Hydrocodone Nausea And Vomiting, Other (See Comments)   "Allergic," per [facility]      Medication List    STOP taking these medications   Banophen 25 mg capsule Generic drug: diphenhydrAMINE   Benadryl Allergy 25 mg capsule Generic drug: diphenhydrAMINE   gabapentin 100 MG capsule Commonly known as: NEURONTIN   insulin lispro 100 UNIT/ML injection Commonly known as: HUMALOG   sodium bicarbonate 650 MG tablet     TAKE these medications   acetaminophen 325 MG tablet Commonly known as: TYLENOL Take 650 mg by mouth every 4 (four) hours as needed for mild pain (AND CANNOT EXCEED 4,000 MG OF TYLENOL FROM ALL SOURCES, COMBINED/24 HOURS).   aspirin 81 MG chewable tablet Chew 81 mg by mouth daily.   atorvastatin 40 MG tablet Commonly known as: LIPITOR Take 40 mg by mouth at bedtime.   Centrum Silver 50+Women Tabs Take 1 tablet by mouth in the morning.   feeding supplement (PRO-STAT SUGAR FREE 64) Liqd Take 30 mLs by mouth in the morning. WILD CHERRY FLAVOR   FLUoxetine 10 MG capsule Commonly known as: PROZAC Take 10 mg by mouth daily.   GaviLAX 17 GM/SCOOP powder Generic drug: polyethylene glycol powder Take 17 g by mouth See admin instructions. Mix 17 grams of powder into 8 ounces of fluid, stir, and drink (by mouth) once a day   loratadine 10 MG tablet Commonly known as: CLARITIN Take 10 mg by mouth daily.   metoprolol tartrate 100 MG tablet Commonly known as: LOPRESSOR Take 100 mg by mouth 2 (two) times daily.   omeprazole 40 MG  capsule Commonly known as: PRILOSEC Take 40 mg by mouth daily before breakfast.   Pepcid AC 10 MG tablet Generic drug: famotidine Take 10 mg by mouth in the morning. What changed: Another medication with the same name was removed. Continue taking this medication, and follow the directions you see here.   SACCHAROMYCES  BOULARDII PO Take 1 capsule by mouth 2 (two) times daily.   senna-docusate 8.6-50 MG tablet Commonly known as: Senokot-S Take 1 tablet by mouth at bedtime as needed for mild constipation.   sevelamer carbonate 800 MG tablet Commonly known as: RENVELA Take 800 mg by mouth 3 (three) times daily with meals.   Vitamin D (Ergocalciferol) 1.25 MG (50000 UNIT) Caps capsule Commonly known as: DRISDOL Take 1 capsule (50,000 Units total) by mouth every 7 (seven) days.        Allergies  Allergen Reactions  . Hydrocodone Nausea And Vomiting and Other (See Comments)    "Allergic," per [facility]    Consultations:  Neurology/nephrology   Procedures/Studies: MR ANGIO HEAD WO CONTRAST  Result Date: 03/29/2020 CLINICAL DATA:  Acute onset of altered mental status during dialysis. Slurred speech and somnolence. EXAM: MRI HEAD WITHOUT CONTRAST MRA HEAD WITHOUT CONTRAST TECHNIQUE: Multiplanar, multiecho pulse sequences of the brain and surrounding structures were obtained without intravenous contrast. Angiographic images of the head were obtained using MRA technique without contrast. COMPARISON:  Head CT 03/29/2020, MRI 03/28/2011, and MRA 03/13/2011 FINDINGS: MRI HEAD FINDINGS The study is mildly motion degraded. Brain: There is no evidence of an acute infarct, intracranial hemorrhage, mass, midline shift, or extra-axial fluid collection. Patchy T2 hyperintensities in the cerebral white matter, left greater than right, have progressed from the prior MRI and are nonspecific but compatible with moderate chronic small vessel ischemic disease. Chronic lacunar infarcts are noted in the basal ganglia, thalami, and left internal capsule. Mild chronic small vessel changes are present in the pons. Mild cerebral atrophy is within normal limits for age. Vascular: Major intracranial vascular flow voids are preserved. Skull and upper cervical spine: Unremarkable bone marrow signal. Sinuses/Orbits: Unremarkable orbits.  Paranasal sinuses and mastoid air cells are clear. Other: 2.1 x 1.5 cm right parotid mass, unchanged in size from 2012. MRA HEAD FINDINGS The study is moderately motion degraded. The visualized distal right vertebral artery is widely patent and supplies the basilar. There is absent flow related enhancement in the included distal V3 and proximal V4 segments of the left vertebral artery which represents a change from the 2012 MRA. Flow related enhancement in the more distal left V4 segment may be retrograde. Patent right AICA and bilateral SCA origins are identified. PICAs and a left AICA are not clearly identified with assessment limited by motion. The basilar artery is patent with mild diffuse irregularity attributed to motion and no evidence of a flow limiting stenosis. Posterior communicating arteries are not identified and may be small or absent. Both PCAs are patent with distal branch vessel irregularity but no significant proximal stenosis. The internal carotid arteries are patent from skull base to carotid termini. Both distal cervical ICAs are tortuous. There are mild left cavernous and mild right and moderate left proximal supraclinoid ICA stenoses versus artifact. ACAs and MCAs are patent with branch vessel assessment limited by motion. There is a severe left A1 origin stenosis, and there may also be moderate to severe mid and distal left A1 stenoses versus artifact. No flow limiting stenosis is evident of the right A1 segment or either M1 segment. No aneurysm is identified.  IMPRESSION: 1. No acute intracranial abnormality. 2. Moderate chronic small vessel ischemic disease, progressed from 2012. 3. Motion degraded head MRA without a large vessel occlusion in the anterior circulation. 4. Absent flow related enhancement in the distal V3 and proximal V4 segments of the left vertebral artery, new from 2012 and which may reflect a proximal occlusion or severe stenosis with reduced flow. 5. Severe left A1  stenosis. 6. Mild right and moderate left ICA stenoses versus artifact. 7. 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 Electronically Signed   By: Logan Bores M.D.   On: 03/29/2020 18:16   MR BRAIN WO CONTRAST  Result Date: 03/29/2020 CLINICAL DATA:  Acute onset of altered mental status during dialysis. Slurred speech and somnolence. EXAM: MRI HEAD WITHOUT CONTRAST MRA HEAD WITHOUT CONTRAST TECHNIQUE: Multiplanar, multiecho pulse sequences of the brain and surrounding structures were obtained without intravenous contrast. Angiographic images of the head were obtained using MRA technique without contrast. COMPARISON:  Head CT 03/29/2020, MRI 03/28/2011, and MRA 03/13/2011 FINDINGS: MRI HEAD FINDINGS The study is mildly motion degraded. Brain: There is no evidence of an acute infarct, intracranial hemorrhage, mass, midline shift, or extra-axial fluid collection. Patchy T2 hyperintensities in the cerebral white matter, left greater than right, have progressed from the prior MRI and are nonspecific but compatible with moderate chronic small vessel ischemic disease. Chronic lacunar infarcts are noted in the basal ganglia, thalami, and left internal capsule. Mild chronic small vessel changes are present in the pons. Mild cerebral atrophy is within normal limits for age. Vascular: Major intracranial vascular flow voids are preserved. Skull and upper cervical spine: Unremarkable bone marrow signal. Sinuses/Orbits: Unremarkable orbits. Paranasal sinuses and mastoid air cells are clear. Other: 2.1 x 1.5 cm right parotid mass, unchanged in size from 2012. MRA HEAD FINDINGS The study is moderately motion degraded. The visualized distal right vertebral artery is widely patent and supplies the basilar. There is absent flow related enhancement in the included distal V3 and proximal V4 segments of the left vertebral artery which represents a change from the 2012 MRA. Flow  related enhancement in the more distal left V4 segment may be retrograde. Patent right AICA and bilateral SCA origins are identified. PICAs and a left AICA are not clearly identified with assessment limited by motion. The basilar artery is patent with mild diffuse irregularity attributed to motion and no evidence of a flow limiting stenosis. Posterior communicating arteries are not identified and may be small or absent. Both PCAs are patent with distal branch vessel irregularity but no significant proximal stenosis. The internal carotid arteries are patent from skull base to carotid termini. Both distal cervical ICAs are tortuous. There are mild left cavernous and mild right and moderate left proximal supraclinoid ICA stenoses versus artifact. ACAs and MCAs are patent with branch vessel assessment limited by motion. There is a severe left A1 origin stenosis, and there may also be moderate to severe mid and distal left A1 stenoses versus artifact. No flow limiting stenosis is evident of the right A1 segment or either M1 segment. No aneurysm is identified. IMPRESSION: 1. No acute intracranial abnormality. 2. Moderate chronic small vessel ischemic disease, progressed from 2012. 3. Motion degraded head MRA without a large vessel occlusion in the anterior circulation. 4. Absent flow related enhancement in the distal V3 and proximal V4 segments of the left vertebral artery, new from 2012 and which may reflect a proximal occlusion or severe stenosis with  reduced flow. 5. Severe left A1 stenosis. 6. Mild right and moderate left ICA stenoses versus artifact. 7. 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 Electronically Signed   By: Logan Bores M.D.   On: 03/29/2020 18:16   DG Chest Port 1 View  Result Date: 03/29/2020 CLINICAL DATA:  Change in mental status EXAM: PORTABLE CHEST 1 VIEW COMPARISON:  None. FINDINGS: The heart size and mediastinal contours are within  normal limits. Both lungs are clear. The visualized skeletal structures are unremarkable. IMPRESSION: No active disease. Electronically Signed   By: Prudencio Pair M.D.   On: 03/29/2020 19:25   CT HEAD CODE STROKE WO CONTRAST  Result Date: 03/29/2020 CLINICAL DATA:  Code stroke. Sudden onset altered mental status with slurred speech. EXAM: CT HEAD WITHOUT CONTRAST TECHNIQUE: Contiguous axial images were obtained from the base of the skull through the vertex without intravenous contrast. COMPARISON:  12/02/2019 FINDINGS: Brain: There is no evidence of an acute infarct, intracranial hemorrhage, mass, midline shift, or extra-axial fluid collection. Mild cerebral atrophy is within normal limits for age. Patchy hypodensities in the cerebral white matter bilaterally are unchanged and nonspecific but compatible with chronic small vessel ischemic disease which is moderate for age. Chronic lacunar infarcts are again noted in the bilateral basal ganglia and thalami. Vascular: Calcified atherosclerosis at the skull base. Skull: No fracture or suspicious osseous lesion. Sinuses/Orbits: Paranasal sinuses and mastoid air cells are clear. Unremarkable orbits. Other: Incompletely imaged 1.6 cm right parotid mass, present dating back to at least 2014 and likely reflecting a benign parotid neoplasm. ASPECTS [facility] Stroke Program Early CT Score) - Ganglionic level infarction (caudate, lentiform nuclei, internal capsule, insula, M1-M3 cortex): 7 - Supraganglionic infarction (M4-M6 cortex): 3 Total score (0-10 with 10 being normal): 10 IMPRESSION: 1. No evidence of acute intracranial abnormality. 2. ASPECTS is 10. 3. Moderate chronic small vessel ischemic disease. These results were communicated to Dr. Cheral Marker at 5:18 pm on 03/29/2020 by text page via the [facility] messaging system. Electronically Signed   By: Logan Bores M.D.   On: 03/29/2020 17:18       Subjective: Patient seen and examined at bedside.  She is a poor historian.   Denies any current headache, nausea, vomiting.  Feels better.  Discharge Exam: Vitals:   03/30/20 0856 03/30/20 0930  BP: (!) 190/86 (!) 181/64  Pulse: 76 79  Resp: 18 20  Temp: 98.5 F (36.9 C) 98.2 F (36.8 C)  SpO2: 95% 90%    General: Pt is alert, awake, not in acute distress.  Poor historian.  Answers some questions. Cardiovascular: rate controlled, S1/S2 + Respiratory: bilateral decreased breath sounds at bases with some scattered crackles Abdominal: Soft, NT, ND, bowel sounds + Extremities: Trace lower extremity edema; no cyanosis    The results of significant diagnostics from this hospitalization (including imaging, microbiology, ancillary and laboratory) are listed below for reference.     Microbiology: Recent Results (from the past 240 hour(s))  Respiratory Panel by RT PCR (Flu A&B, Covid) - Nasopharyngeal Swab     Status: None   Collection Time: 03/29/20  7:10 PM   Specimen: Nasopharyngeal Swab  Result Value Ref Range Status   SARS Coronavirus 2 by RT PCR NEGATIVE NEGATIVE Final    Comment: (NOTE) SARS-CoV-2 target nucleic acids are NOT DETECTED.  The SARS-CoV-2 RNA is generally detectable in upper respiratoy specimens during the acute phase of infection. The lowest concentration of SARS-CoV-2 viral copies  this assay can detect is 131 copies/mL. A negative result does not preclude SARS-Cov-2 infection and should not be used as the sole basis for treatment or other patient management decisions. A negative result may occur with  improper specimen collection/handling, submission of specimen other than nasopharyngeal swab, presence of viral mutation(s) within the areas targeted by this assay, and inadequate number of viral copies (<131 copies/mL). A negative result must be combined with clinical observations, patient history, and epidemiological information. The expected result is Negative.  Fact Sheet for Patients:   PinkCheek.be  Fact Sheet for Healthcare Providers:  GravelBags.it  This test is no t yet approved or cleared by the [place] FDA and  has been authorized for detection and/or diagnosis of SARS-CoV-2 by FDA under an Emergency Use Authorization (EUA). This EUA will remain  in effect (meaning this test can be used) for the duration of the COVID-19 declaration under Section 564(b)(1) of the Act, 21 U.S.C. section 360bbb-3(b)(1), unless the authorization is terminated or revoked sooner.     Influenza A by PCR NEGATIVE NEGATIVE Final   Influenza B by PCR NEGATIVE NEGATIVE Final    Comment: (NOTE) The Xpert Xpress SARS-CoV-2/FLU/RSV assay is intended as an aid in  the diagnosis of influenza from Nasopharyngeal swab specimens and  should not be used as a sole basis for treatment. Nasal washings and  aspirates are unacceptable for Xpert Xpress SARS-CoV-2/FLU/RSV  testing.  Fact Sheet for Patients: PinkCheek.be  Fact Sheet for Healthcare Providers: GravelBags.it  This test is not yet approved or cleared by the [place] FDA and  has been authorized for detection and/or diagnosis of SARS-CoV-2 by  FDA under an Emergency Use Authorization (EUA). This EUA will remain  in effect (meaning this test can be used) for the duration of the  Covid-19 declaration under Section 564(b)(1) of the Act, 21  U.S.C. section 360bbb-3(b)(1), unless the authorization is  terminated or revoked. Performed at [facility], [place] [address] [place], [place] [address]      Labs: BNP (last 3 results) Recent Labs    12/03/19 0329  BNP 5,053.9*   Basic Metabolic Panel: Recent Labs  Lab 03/29/20 1644 03/29/20 1646  NA 141 139  K 3.3* 3.4*  CL 98 95*  CO2 31  --   GLUCOSE 109* 107*  BUN 7 8  CREATININE 1.77* 1.70*  CALCIUM 7.8*  --    Liver Function Tests: Recent Labs   Lab 03/29/20 1644  AST 15  ALT 12  ALKPHOS 82  BILITOT 0.3  PROT 7.0  ALBUMIN 3.5   No results for input(s): LIPASE, AMYLASE in the last 168 hours. No results for input(s): AMMONIA in the last 168 hours. CBC: Recent Labs  Lab 03/29/20 1644 03/29/20 1646  WBC 11.6*  --   NEUTROABS 9.1*  --   HGB 8.8* 9.5*  HCT 29.3* 28.0*  MCV 99.0  --   PLT 234  --    Cardiac Enzymes: No results for input(s): CKTOTAL, CKMB, CKMBINDEX, TROPONINI in the last 168 hours. BNP: Invalid input(s): POCBNP CBG: Recent Labs  Lab 03/29/20 1642  GLUCAP 105*   D-Dimer No results for input(s): DDIMER in the last 72 hours. Hgb A1c Recent Labs    03/30/20 0358  HGBA1C 7.0*   Lipid Profile Recent Labs    03/30/20 0359  CHOL 122  HDL 39*  LDLCALC 26  TRIG 285*  CHOLHDL 3.1   Thyroid function studies No results for input(s): TSH, T4TOTAL,  T3FREE, THYROIDAB in the last 72 hours.  Invalid input(s): FREET3 Anemia work up No results for input(s): VITAMINB12, FOLATE, FERRITIN, TIBC, IRON, RETICCTPCT in the last 72 hours. Urinalysis    Component Value Date/Time   COLORURINE YELLOW 12/02/2019 1700   APPEARANCEUR CLEAR 12/02/2019 1700   LABSPEC 1.005 12/02/2019 1700   PHURINE 8.0 12/02/2019 1700   GLUCOSEU >=500 (A) 12/02/2019 1700   HGBUR NEGATIVE 12/02/2019 1700   BILIRUBINUR NEGATIVE 12/02/2019 1700   BILIRUBINUR n 05/25/2012 1202   KETONESUR NEGATIVE 12/02/2019 1700   PROTEINUR 100 (A) 12/02/2019 1700   UROBILINOGEN 0.2 05/25/2012 1202   UROBILINOGEN 0.2 03/29/2011 0131   NITRITE NEGATIVE 12/02/2019 1700   LEUKOCYTESUR NEGATIVE 12/02/2019 1700   Sepsis Labs Invalid input(s): PROCALCITONIN,  WBC,  LACTICIDVEN Microbiology Recent Results (from the past 240 hour(s))  Respiratory Panel by RT PCR (Flu A&B, Covid) - Nasopharyngeal Swab     Status: None   Collection Time: 03/29/20  7:10 PM   Specimen: Nasopharyngeal Swab  Result Value Ref Range Status   SARS Coronavirus 2 by RT  PCR NEGATIVE NEGATIVE Final    Comment: (NOTE) SARS-CoV-2 target nucleic acids are NOT DETECTED.  The SARS-CoV-2 RNA is generally detectable in upper respiratoy specimens during the acute phase of infection. The lowest concentration of SARS-CoV-2 viral copies this assay can detect is 131 copies/mL. A negative result does not preclude SARS-Cov-2 infection and should not be used as the sole basis for treatment or other patient management decisions. A negative result may occur with  improper specimen collection/handling, submission of specimen other than nasopharyngeal swab, presence of viral mutation(s) within the areas targeted by this assay, and inadequate number of viral copies (<131 copies/mL). A negative result must be combined with clinical observations, patient history, and epidemiological information. The expected result is Negative.  Fact Sheet for Patients:  PinkCheek.be  Fact Sheet for Healthcare Providers:  GravelBags.it  This test is no t yet approved or cleared by the [place] FDA and  has been authorized for detection and/or diagnosis of SARS-CoV-2 by FDA under an Emergency Use Authorization (EUA). This EUA will remain  in effect (meaning this test can be used) for the duration of the COVID-19 declaration under Section 564(b)(1) of the Act, 21 U.S.C. section 360bbb-3(b)(1), unless the authorization is terminated or revoked sooner.     Influenza A by PCR NEGATIVE NEGATIVE Final   Influenza B by PCR NEGATIVE NEGATIVE Final    Comment: (NOTE) The Xpert Xpress SARS-CoV-2/FLU/RSV assay is intended as an aid in  the diagnosis of influenza from Nasopharyngeal swab specimens and  should not be used as a sole basis for treatment. Nasal washings and  aspirates are unacceptable for Xpert Xpress SARS-CoV-2/FLU/RSV  testing.  Fact Sheet for Patients: PinkCheek.be  Fact Sheet for  Healthcare Providers: GravelBags.it  This test is not yet approved or cleared by the [place] FDA and  has been authorized for detection and/or diagnosis of SARS-CoV-2 by  FDA under an Emergency Use Authorization (EUA). This EUA will remain  in effect (meaning this test can be used) for the duration of the  Covid-19 declaration under Section 564(b)(1) of the Act, 21  U.S.C. section 360bbb-3(b)(1), unless the authorization is  terminated or revoked. Performed at [facility], [place] [address], [place], [place] [address]      Time coordinating discharge: 35 minutes  SIGNED:   Aline August, MD  [facility] 03/30/2020, 11:15 AM

## 2020-03-30 NOTE — Progress Notes (Addendum)
NEUROLOGY PROGRESS NOTE   Subjective: No complaints and alert and oriented.   Exam: Vitals:   03/30/20 0856 03/30/20 0930  BP: (!) 190/86 (!) 181/64  Pulse: 76 79  Resp: 18 20  Temp: 98.5 F (36.9 C) 98.2 F (36.8 C)  SpO2: 95% 90%    Neuro:  Mental Status: Alert, oriented, thought content appropriate.  Speech fluent without evidence of aphasia.  Able to follow 3 step commands without difficulty. Cranial Nerves: II:  Visual fields grossly normal,  III,IV, VI: ptosis not present, extra-ocular motions intact bilaterally pupils equal, round, reactive to light and accommodation V,VII: smile symmetric, facial light touch sensation normal bilaterally VIII: hearing normal bilaterally IX,X: Palate rises midline XI: bilateral shoulder shrug XII: midline tongue extension Motor: Moving all extremities antigravity     Medications:  Scheduled: .  stroke: mapping our early stages of recovery book   Does not apply Once  . atorvastatin  40 mg Oral QHS  . FLUoxetine  10 mg Oral Daily  . metoprolol tartrate  100 mg Oral BID  . pantoprazole  40 mg Oral Daily  . sevelamer carbonate  800 mg Oral TID WC  . sodium bicarbonate  1,300 mg Oral BID  . sodium chloride flush  3 mL Intravenous Once   Pertinent Labs/Diagnostics:   MR ANGIO HEAD WO CONTRAST  Result Date: 03/29/2020 CLINICAL DATA:  Acute onset of altered mental status during dialysis. Slurred speech and somnolence. EXAM: MRI HEAD WITHOUT CONTRAST MRA HEAD WITHOUT CONTRAST TECHNIQUE: Multiplanar, multiecho pulse sequences of the brain and surrounding structures were obtained without intravenous contrast. Angiographic images of the head were obtained using MRA technique without contrast. COMPARISON:  Head CT 03/29/2020, MRI 03/28/2011, and MRA 03/13/2011 FINDINGS: MRI HEAD FINDINGS The study is mildly motion degraded. Brain: There is no evidence of an acute infarct, intracranial hemorrhage, mass, midline shift, or extra-axial fluid  collection. Patchy T2 hyperintensities in the cerebral white matter, left greater than right, have progressed from the prior MRI and are nonspecific but compatible with moderate chronic small vessel ischemic disease. Chronic lacunar infarcts are noted in the basal ganglia, thalami, and left internal capsule. Mild chronic small vessel changes are present in the pons. Mild cerebral atrophy is within normal limits for age. Vascular: Major intracranial vascular flow voids are preserved. Skull and upper cervical spine: Unremarkable bone marrow signal. Sinuses/Orbits: Unremarkable orbits. Paranasal sinuses and mastoid air cells are clear. Other: 2.1 x 1.5 cm right parotid mass, unchanged in size from 2012. MRA HEAD FINDINGS The study is moderately motion degraded. The visualized distal right vertebral artery is widely patent and supplies the basilar. There is absent flow related enhancement in the included distal V3 and proximal V4 segments of the left vertebral artery which represents a change from the 2012 MRA. Flow related enhancement in the more distal left V4 segment may be retrograde. Patent right AICA and bilateral SCA origins are identified. PICAs and a left AICA are not clearly identified with assessment limited by motion. The basilar artery is patent with mild diffuse irregularity attributed to motion and no evidence of a flow limiting stenosis. Posterior communicating arteries are not identified and may be small or absent. Both PCAs are patent with distal branch vessel irregularity but no significant proximal stenosis. The internal carotid arteries are patent from skull base to carotid termini. Both distal cervical ICAs are tortuous. There are mild left cavernous and mild right and moderate left proximal supraclinoid ICA stenoses versus artifact. ACAs and MCAs are  patent with branch vessel assessment limited by motion. There is a severe left A1 origin stenosis, and there may also be moderate to severe mid and  distal left A1 stenoses versus artifact. No flow limiting stenosis is evident of the right A1 segment or either M1 segment. No aneurysm is identified. IMPRESSION: 1. No acute intracranial abnormality. 2. Moderate chronic small vessel ischemic disease, progressed from 2012. 3. Motion degraded head MRA without a large vessel occlusion in the anterior circulation. 4. Absent flow related enhancement in the distal V3 and proximal V4 segments of the left vertebral artery, new from 2012 and which may reflect a proximal occlusion or severe stenosis with reduced flow. 5. Severe left A1 stenosis. 6. Mild right and moderate left ICA stenoses versus artifact. 7. 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 Electronically Signed   By: Logan Bores M.D.   On: 03/29/2020 18:16   MR BRAIN WO CONTRAST  Result Date: 03/29/2020 CLINICAL DATA:  Acute onset of altered mental status during dialysis. Slurred speech and somnolence. EXAM: MRI HEAD WITHOUT CONTRAST MRA HEAD WITHOUT CONTRAST TECHNIQUE: Multiplanar, multiecho pulse sequences of the brain and surrounding structures were obtained without intravenous contrast. Angiographic images of the head were obtained using MRA technique without contrast. COMPARISON:  Head CT 03/29/2020, MRI 03/28/2011, and MRA 03/13/2011 FINDINGS: MRI HEAD FINDINGS The study is mildly motion degraded. Brain: There is no evidence of an acute infarct, intracranial hemorrhage, mass, midline shift, or extra-axial fluid collection. Patchy T2 hyperintensities in the cerebral white matter, left greater than right, have progressed from the prior MRI and are nonspecific but compatible with moderate chronic small vessel ischemic disease. Chronic lacunar infarcts are noted in the basal ganglia, thalami, and left internal capsule. Mild chronic small vessel changes are present in the pons. Mild cerebral atrophy is within normal limits for age. Vascular: Major  intracranial vascular flow voids are preserved. Skull and upper cervical spine: Unremarkable bone marrow signal. Sinuses/Orbits: Unremarkable orbits. Paranasal sinuses and mastoid air cells are clear. Other: 2.1 x 1.5 cm right parotid mass, unchanged in size from 2012. MRA HEAD FINDINGS The study is moderately motion degraded. The visualized distal right vertebral artery is widely patent and supplies the basilar. There is absent flow related enhancement in the included distal V3 and proximal V4 segments of the left vertebral artery which represents a change from the 2012 MRA. Flow related enhancement in the more distal left V4 segment may be retrograde. Patent right AICA and bilateral SCA origins are identified. PICAs and a left AICA are not clearly identified with assessment limited by motion. The basilar artery is patent with mild diffuse irregularity attributed to motion and no evidence of a flow limiting stenosis. Posterior communicating arteries are not identified and may be small or absent. Both PCAs are patent with distal branch vessel irregularity but no significant proximal stenosis. The internal carotid arteries are patent from skull base to carotid termini. Both distal cervical ICAs are tortuous. There are mild left cavernous and mild right and moderate left proximal supraclinoid ICA stenoses versus artifact. ACAs and MCAs are patent with branch vessel assessment limited by motion. There is a severe left A1 origin stenosis, and there may also be moderate to severe mid and distal left A1 stenoses versus artifact. No flow limiting stenosis is evident of the right A1 segment or either M1 segment. No aneurysm is identified. IMPRESSION: 1. No acute intracranial abnormality. 2. Moderate chronic small vessel  ischemic disease, progressed from 2012. 3. Motion degraded head MRA without a large vessel occlusion in the anterior circulation. 4. Absent flow related enhancement in the distal V3 and proximal V4 segments  of the left vertebral artery, new from 2012 and which may reflect a proximal occlusion or severe stenosis with reduced flow. 5. Severe left A1 stenosis. 6. Mild right and moderate left ICA stenoses versus artifact. 7. 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 Electronically Signed   By: Logan Bores M.D.   On: 03/29/2020 18:16   DG Chest Port 1 View  Result Date: 03/29/2020 CLINICAL DATA:  Change in mental status EXAM: PORTABLE CHEST 1 VIEW COMPARISON:  None. FINDINGS: The heart size and mediastinal contours are within normal limits. Both lungs are clear. The visualized skeletal structures are unremarkable. IMPRESSION: No active disease. Electronically Signed   By: Prudencio Pair M.D.   On: 03/29/2020 19:25   CT HEAD CODE STROKE WO CONTRAST  Result Date: 03/29/2020 CLINICAL DATA:  Code stroke. Sudden onset altered mental status with slurred speech. EXAM: CT HEAD WITHOUT CONTRAST TECHNIQUE: Contiguous axial images were obtained from the base of the skull through the vertex without intravenous contrast. COMPARISON:  12/02/2019 FINDINGS: Brain: There is no evidence of an acute infarct, intracranial hemorrhage, mass, midline shift, or extra-axial fluid collection. Mild cerebral atrophy is within normal limits for age. Patchy hypodensities in the cerebral white matter bilaterally are unchanged and nonspecific but compatible with chronic small vessel ischemic disease which is moderate for age. Chronic lacunar infarcts are again noted in the bilateral basal ganglia and thalami. Vascular: Calcified atherosclerosis at the skull base. Skull: No fracture or suspicious osseous lesion. Sinuses/Orbits: Paranasal sinuses and mastoid air cells are clear. Unremarkable orbits. Other: Incompletely imaged 1.6 cm right parotid mass, present dating back to at least 2014 and likely reflecting a benign parotid neoplasm. ASPECTS [facility] Stroke Program Early CT Score) -  Ganglionic level infarction (caudate, lentiform nuclei, internal capsule, insula, M1-M3 cortex): 7 - Supraganglionic infarction (M4-M6 cortex): 3 Total score (0-10 with 10 being normal): 10 IMPRESSION: 1. No evidence of acute intracranial abnormality. 2. ASPECTS is 10. 3. Moderate chronic small vessel ischemic disease. These results were communicated to Dr. Cheral Marker at 5:18 pm on 03/29/2020 by text page via the [facility] messaging system. Electronically Signed   By: Logan Bores M.D.   On: 03/29/2020 17:18     Etta Quill PA-C [facility] [phone]  Assessment/Recommendations: 59 y.o. female with ESRD on HD, depression, HLD, HTN, osteoporosis, left frontal subcortical stroke, TIA, tobacco abuse, DM2 and vitamin D deficiency, who presents acutely via EMS from her hemodialysis center for acute onset of AMS.  - Today patient is back to baseline.  - MRI negative for intracranial abnormality.  - MRA revealed absent flow related enhancement in the distal V3 and proximal V4 segments of the left vertebral artery, new from 2012 and which may reflect a proximal occlusion or severe stenosis with reduced flow. This finding does not correlate with her presenting symptoms and signs, but she will need outpatient Neurology follow up given possible progression of her intracranial atherosclerotic disease.  - Talked with her Nephrologist who believes that her presentation was most likely secondary to hypoglycemia.  - Agree with discontinuation of sedating meds - Continue ASA and atorvastatin - Neurohospitalist service will sign off. Please call if there are additional questions.   Electronically signed: Dr. Kerney Elbe 03/30/2020, 10:38 AM

## 2020-03-30 NOTE — TOC Transition Note (Signed)
Transition of Care [facility]) - CM/SW Discharge Note   Patient Details  Name: [name] Michael MRN: [id number] Date of Birth: [birth year]  [facility] CM/SW Contact:  Pollie Friar, RN Phone Number: 03/30/2020, 12:03 PM   Clinical Narrative:    Pt is discharging back to [facility] today. CM has updated the facility. Bedside RN updated and d/c packet at the desk. Pt will transport via [facility].   Room: 203--south hall Number for report: [phone]   Final next level of care: Skilled Nursing Facility Barriers to Discharge: No Barriers Identified   Patient Goals and CMS Choice   CMS Medicare.gov Compare Post Acute Care list provided to:: Patient Choice offered to / list presented to : Patient, Sibling  Discharge Placement              Patient chooses bed at: [facility] Patient to be transferred to facility by: [place] Name of family member notified: [name] Perdue--sister Patient and family notified of of transfer: 03/30/20  Discharge Plan and Services                                     Social Determinants of Health (SDOH) Interventions     Readmission Risk Interventions Readmission Risk Prevention Plan 12/07/2019 04/29/2019  Transportation Screening Complete Complete  PCP or Specialist Appt within 3-5 Days Complete Complete  HRI or Home Care Consult Complete Complete  Social Work Consult for [place] Planning/Counseling Complete Complete  Palliative Care Screening Not Applicable Not Applicable  Medication Review (RN Care Manager) Referral to Pharmacy Referral to Pharmacy  Some recent data might be hidden

## 2020-03-30 NOTE — Progress Notes (Signed)
PT Cancellation Note  Patient Details Name: [name] Michael MRN: [id number] DOB: [birth year]   Cancelled Treatment:    Reason Eval/Treat Not Completed: Other (comment) PT orders received and chart reviewed.  Noted pt's symptoms have resolved and to discharge back to facility (SNF) and [facility] has been called  Spoke with RN who expects [facility] around 1300.  Will f/u later if pt not discharged.  Abran Richard, PT Acute Rehab Services Pager [phone] [facility] [place] 03/30/2020, 12:49 PM

## 2020-03-30 NOTE — Plan of Care (Signed)
  Problem: Education: Goal: Knowledge of disease or condition will improve 03/30/2020 1255 by Myriam Forehand, RN Outcome: Adequate for Discharge 03/30/2020 1250 by Myriam Forehand, RN Outcome: Adequate for Discharge Goal: Knowledge of secondary prevention will improve 03/30/2020 1255 by Myriam Forehand, RN Outcome: Adequate for Discharge 03/30/2020 1250 by Myriam Forehand, RN Outcome: Adequate for Discharge Goal: Knowledge of patient specific risk factors addressed and post discharge goals established will improve 03/30/2020 1255 by Myriam Forehand, RN Outcome: Adequate for Discharge 03/30/2020 1250 by Myriam Forehand, RN Outcome: Adequate for Discharge Goal: Individualized Educational Video(s) 03/30/2020 1255 by Myriam Forehand, RN Outcome: Adequate for Discharge 03/30/2020 1250 by Myriam Forehand, RN Outcome: Adequate for Discharge   Problem: Coping: Goal: Will verbalize positive feelings about self 03/30/2020 1255 by Myriam Forehand, RN Outcome: Adequate for Discharge 03/30/2020 1250 by Myriam Forehand, RN Outcome: Adequate for Discharge Goal: Will identify appropriate support needs 03/30/2020 1255 by Myriam Forehand, RN Outcome: Adequate for Discharge 03/30/2020 1250 by Myriam Forehand, RN Outcome: Adequate for Discharge   Problem: Health Behavior/Discharge Planning: Goal: Ability to manage health-related needs will improve 03/30/2020 1255 by Myriam Forehand, RN Outcome: Adequate for Discharge 03/30/2020 1250 by Myriam Forehand, RN Outcome: Adequate for Discharge   Problem: Self-Care: Goal: Ability to participate in self-care as condition permits will improve 03/30/2020 1255 by Myriam Forehand, RN Outcome: Adequate for Discharge 03/30/2020 1250 by Myriam Forehand, RN Outcome: Adequate for Discharge Goal: Verbalization of feelings and concerns over difficulty with self-care will improve 03/30/2020 1255 by Myriam Forehand, RN Outcome: Adequate for Discharge 03/30/2020 1250 by Myriam Forehand, RN Outcome: Adequate  for Discharge Goal: Ability to communicate needs accurately will improve 03/30/2020 1255 by Myriam Forehand, RN Outcome: Adequate for Discharge 03/30/2020 1250 by Myriam Forehand, RN Outcome: Adequate for Discharge

## 2020-03-30 NOTE — ED Notes (Signed)
Patient up walking to bathroom using rollater. Gait steady

## 2020-03-30 NOTE — Progress Notes (Signed)
[name] [facility] BRIEF PROGRESS NOTE  [name] Michael [id number] December 20, 1960   Patient seen and examined in room.  Admitted to observation yesterday due to altered mental status.  Today appears to be at baseline mental status.  Alert and oriented to person, place and year.  No sign/symptoms of uremia.  No urgent indications for dialysis today.  Dialysis completed yesterday.  Will write orders for HD tomorrow per regular schedule if remains admitted.  If discharged resume dialysis at outpatient center tomorrow. Will complete full consult if changed to inpatient status.   HD orders: [place] - TTS 3hrs 32min 350/600 46kg 2K 2.25Ca Hect 87mcg qHD Heparin 2000 units qHD Venofer 50mg  qwk Mircera 48mcg q2kws - last 10/5  Jen Mow, PA-C [facility] Pager: [phone]

## 2020-03-30 NOTE — Plan of Care (Signed)
  Problem: Education: Goal: Knowledge of disease or condition will improve Outcome: Adequate for Discharge Goal: Knowledge of secondary prevention will improve Outcome: Adequate for Discharge Goal: Knowledge of patient specific risk factors addressed and post discharge goals established will improve Outcome: Adequate for Discharge Goal: Individualized Educational Video(s) Outcome: Adequate for Discharge   Problem: Coping: Goal: Will verbalize positive feelings about self Outcome: Adequate for Discharge Goal: Will identify appropriate support needs Outcome: Adequate for Discharge   Problem: Health Behavior/Discharge Planning: Goal: Ability to manage health-related needs will improve Outcome: Adequate for Discharge   Problem: Self-Care: Goal: Ability to participate in self-care as condition permits will improve Outcome: Adequate for Discharge Goal: Verbalization of feelings and concerns over difficulty with self-care will improve Outcome: Adequate for Discharge Goal: Ability to communicate needs accurately will improve Outcome: Adequate for Discharge

## 2020-05-28 ENCOUNTER — Ambulatory Visit: Payer: Medicare Other | Admitting: Internal Medicine

## 2020-06-07 ENCOUNTER — Ambulatory Visit: Payer: Self-pay | Admitting: Neurology

## 2020-06-28 ENCOUNTER — Other Ambulatory Visit: Payer: Self-pay

## 2020-06-28 ENCOUNTER — Emergency Department (HOSPITAL_COMMUNITY): Payer: Medicare Other

## 2020-06-28 ENCOUNTER — Encounter (HOSPITAL_COMMUNITY): Payer: Self-pay

## 2020-06-28 ENCOUNTER — Emergency Department (HOSPITAL_COMMUNITY)
Admission: EM | Admit: 2020-06-28 | Discharge: 2020-06-29 | Disposition: A | Discharge status: Home / Self Care | Payer: Medicare Other | Attending: Emergency Medicine | Admitting: Emergency Medicine

## 2020-06-28 DIAGNOSIS — Z992 Dependence on renal dialysis: Secondary | ICD-10-CM | POA: Diagnosis not present

## 2020-06-28 DIAGNOSIS — Z794 Long term (current) use of insulin: Secondary | ICD-10-CM | POA: Insufficient documentation

## 2020-06-28 DIAGNOSIS — I132 Hypertensive heart and chronic kidney disease with heart failure and with stage 5 chronic kidney disease, or end stage renal disease: Secondary | ICD-10-CM | POA: Insufficient documentation

## 2020-06-28 DIAGNOSIS — Z79899 Other long term (current) drug therapy: Secondary | ICD-10-CM | POA: Diagnosis not present

## 2020-06-28 DIAGNOSIS — I5032 Chronic diastolic (congestive) heart failure: Secondary | ICD-10-CM | POA: Diagnosis not present

## 2020-06-28 DIAGNOSIS — N186 End stage renal disease: Secondary | ICD-10-CM | POA: Diagnosis not present

## 2020-06-28 DIAGNOSIS — S93421A Sprain of deltoid ligament of right ankle, initial encounter: Secondary | ICD-10-CM | POA: Diagnosis not present

## 2020-06-28 DIAGNOSIS — F17210 Nicotine dependence, cigarettes, uncomplicated: Secondary | ICD-10-CM | POA: Diagnosis not present

## 2020-06-28 DIAGNOSIS — Z7982 Long term (current) use of aspirin: Secondary | ICD-10-CM | POA: Insufficient documentation

## 2020-06-28 DIAGNOSIS — E1122 Type 2 diabetes mellitus with diabetic chronic kidney disease: Secondary | ICD-10-CM | POA: Insufficient documentation

## 2020-06-28 DIAGNOSIS — S99911A Unspecified injury of right ankle, initial encounter: Secondary | ICD-10-CM | POA: Diagnosis present

## 2020-06-28 DIAGNOSIS — W1839XA Other fall on same level, initial encounter: Secondary | ICD-10-CM | POA: Diagnosis not present

## 2020-06-28 DIAGNOSIS — R220 Localized swelling, mass and lump, head: Secondary | ICD-10-CM | POA: Insufficient documentation

## 2020-06-28 DIAGNOSIS — U071 COVID-19: Secondary | ICD-10-CM | POA: Insufficient documentation

## 2020-06-28 DIAGNOSIS — W19XXXA Unspecified fall, initial encounter: Secondary | ICD-10-CM

## 2020-06-28 LAB — COMPREHENSIVE METABOLIC PANEL
ALT: 15 U/L (ref 0–44)
AST: 20 U/L (ref 15–41)
Albumin: 3.2 g/dL — ABNORMAL LOW (ref 3.5–5.0)
Alkaline Phosphatase: 63 U/L (ref 38–126)
Anion gap: 19 — ABNORMAL HIGH (ref 5–15)
BUN: 58 mg/dL — ABNORMAL HIGH (ref 6–20)
CO2: 25 mmol/L (ref 22–32)
Calcium: 8.6 mg/dL — ABNORMAL LOW (ref 8.9–10.3)
Chloride: 89 mmol/L — ABNORMAL LOW (ref 98–111)
Creatinine, Ser: 5.99 mg/dL — ABNORMAL HIGH (ref 0.44–1.00)
GFR, Estimated: 8 mL/min — ABNORMAL LOW (ref >60)
Glucose, Bld: 121 mg/dL — ABNORMAL HIGH (ref 70–99)
Potassium: 4.1 mmol/L (ref 3.5–5.1)
Sodium: 133 mmol/L — ABNORMAL LOW (ref 135–145)
Total Bilirubin: 0.7 mg/dL (ref 0.3–1.2)
Total Protein: 7.4 g/dL (ref 6.5–8.1)

## 2020-06-28 LAB — CBC WITH DIFFERENTIAL/PLATELET
Abs Immature Granulocytes: 0.04 10*3/uL (ref 0.00–0.07)
Basophils Absolute: 0 10*3/uL (ref 0.0–0.1)
Basophils Relative: 0 %
Eosinophils Absolute: 0.1 10*3/uL (ref 0.0–0.5)
Eosinophils Relative: 1 %
HCT: 34.3 % — ABNORMAL LOW (ref 36.0–46.0)
Hemoglobin: 11.2 g/dL — ABNORMAL LOW (ref 12.0–15.0)
Immature Granulocytes: 0 %
Lymphocytes Relative: 13 %
Lymphs Abs: 1.2 10*3/uL (ref 0.7–4.0)
MCH: 30.9 pg (ref 26.0–34.0)
MCHC: 32.7 g/dL (ref 30.0–36.0)
MCV: 94.5 fL (ref 80.0–100.0)
Monocytes Absolute: 0.7 10*3/uL (ref 0.1–1.0)
Monocytes Relative: 8 %
Neutro Abs: 7.1 10*3/uL (ref 1.7–7.7)
Neutrophils Relative %: 78 %
Platelets: 264 10*3/uL (ref 150–400)
RBC: 3.63 MIL/uL — ABNORMAL LOW (ref 3.87–5.11)
RDW: 17 % — ABNORMAL HIGH (ref 11.5–15.5)
WBC: 9.2 10*3/uL (ref 4.0–10.5)
nRBC: 0 % (ref 0.0–0.2)

## 2020-06-28 LAB — POC SARS CORONAVIRUS 2 AG -  ED: SARS Coronavirus 2 Ag: POSITIVE — AB

## 2020-06-28 NOTE — ED Triage Notes (Signed)
Pt BIB [facility] from [facility] c/o multiple falls in the last 2 days. Pt is c/o of right ankle/foot pain with obvious swelling to the ankle. Pt states she was trying to get back in to bed when she fell. Pt also states she has been weak for a couple days now. Per EMS facility did COVID testing today and pt's rapid test came back positive at facility. Pt denies any other symptoms or pain. Pt is a dialysis pt T, Thur, S but skipped today due to the ankle pain. Pt is also HIV positive.

## 2020-06-28 NOTE — ED Notes (Signed)
[place] - RN aware of pt's BP (149/47).

## 2020-06-28 NOTE — ED Provider Notes (Signed)
[facility] EMERGENCY DEPARTMENT Provider Note   CSN: [id number] Arrival date & time: 06/28/20  1654     History Chief Complaint  Patient presents with  . Fall    [name] Michael is a 60 y.o. female.  The history is provided by the patient and medical records.  Fall   [name] Michael is a 60 y.o. female who presents to the Emergency Department complaining of fall. She presents the emergency department by EMS for evaluation of injuries following a fall. She is a resident at [facility]. Per reports she has had multiple falls over the last two days. At baseline she ambulates with a walker. She states that she felt sometime last night and complaints of pain to her right foot and ankle. She also reports feeling unwell with nausea, chest tightness, cough and malaise. [name] Michael did a rapid COVID test today and it was positive. She has been fully vaccinated for COVID-19 with two vaccines and booster. She also is ESRD on hemodialysis. She missed today's dialysis session due to the fall.    Past Medical History:  Diagnosis Date  . Ambulates with cane   . Constipation   . Depression   . ESRD (end stage renal disease) ([place])    a. TTS Dialysis.  Marland Kitchen GERD (gastroesophageal reflux disease)   . History of stress test    a. 01/2003 MV: EF 74%, no ischemia/infarct.  . Hyperlipidemia   . Hypertension   . Osteoporosis   . Stroke [facility]) 04-01-11   left frontal subcortical, saw Dr. [name]   . Syncope 11/2019  . TIA (transient ischemic attack) 03-12-11  . Tobacco abuse   . Tricuspid regurgitation    a. 05/2016 Echo: EF 65-70%, Gr2DD, mild MR, nl RV fxn, Triv TR, PASP 4mmHg; b. 05/2018 Echo: EF 60-65%, Gr2DD, mild MR/TR, RVSP/PASP 41mmHg; c. 11/2019 Echo: EF 55-60%, no rwma, mild-mod MR, Sev TR w/ RV dilatation (CTA chest neg for PE).  . Type II diabetes mellitus ([place])   . Vitamin D deficiency     Patient Active Problem List   Diagnosis Date Noted  . Change in mental status  03/30/2020  . Stroke (cerebrum) ([place]) 03/29/2020  . Hypoxia   . Acute on chronic right-sided heart failure ([place])   . Severe tricuspid regurgitation 12/04/2019  . ESRD (end stage renal disease) ([place])   . GERD (gastroesophageal reflux disease)   . Hypertension   . Type II diabetes mellitus ([place])   . Weakness   . Syncope 12/03/2019  . Wrist pain, acute, right   . 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 AMS (altered mental status) 04/28/2019  . Hypoxemia 04/28/2019  . Fall 06/09/2018  . Fall at home, initial encounter 06/09/2018  . Anemia of chronic disease 06/09/2018  . Syncope and collapse 06/09/2018  . CKD (chronic kidney disease) stage 5, GFR less than 15 ml/min (HCC) 03/10/2018  . Acute encephalopathy 03/10/2018  . Hypermagnesemia 03/10/2018  . AKI (acute kidney injury) ([place]) 11/09/2016  . Hypercalcemia 11/09/2016  . Hyponatremia 11/09/2016  . Hypovolemia 11/09/2016  . Accelerated hypertension 11/09/2016  . Acute lower UTI 11/09/2016  . Nephrolithiasis 10/24/2016  . Constipation 10/22/2016  . CAP (community acquired pneumonia) 10/22/2016  . Hydronephrosis of right kidney 10/22/2016  . Metabolic acidosis 56/38/7564  . CVA (cerebral vascular accident) ([place]) 07/03/2016  . HAP (hospital-acquired pneumonia) 07/03/2016  . Sepsis secondary to UTI ([place])   . UTI due to extended-spectrum beta lactamase (ESBL) producing Escherichia coli   .  Acute pyelonephritis   . Bacteremia due to Escherichia coli   . Colitis, indeterminate   . Uncontrolled type 2 diabetes mellitus with complication ([place])   . 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 associated with diabetes mellitus due to underlying condition ([place])   . Chronic diastolic CHF (congestive heart failure) ([place])   . Abdominal pain   . Colitis   . Hypophosphatemia 06/12/2016  . Hypokalemia 06/11/2016  . Hypocalcemia 06/11/2016  . Acute kidney injury superimposed on chronic kidney disease ([place]) 06/11/2016  .  Proliferative diabetic retinopathy ([place]) 04/29/2016  . Closed fracture of left distal radius 01/28/2016  . Poor social situation 03/09/2013  . Depression 03/01/2013  . Abnormal mammogram 12/20/2012  . Retinal detachment 11/17/2012  . 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 ([place]) 62/83/6629  . Hyperlipidemia 02/09/2007  . Essential hypertension 02/09/2007  . GERD 02/09/2007    Past Surgical History:  Procedure Laterality Date  . AV FISTULA PLACEMENT Left 02/21/2019   Procedure: BRACHIOCEPHALIC ARTERIOVENOUS (AV) FISTULA CREATION;  Surgeon: [name], MD;  Location: [facility];  Service: Vascular;  Laterality: Left;  . BASCILIC VEIN TRANSPOSITION Left 04/11/2019   Procedure: SECOND STAGE BASILIC VEIN TRANSPOSITION LEFT ARM;  Surgeon: [name], MD;  Location: [facility];  Service: Vascular;  Laterality: Left;  . IR FLUORO GUIDE CV LINE RIGHT  04/29/2019  . IR US GUIDE VASC ACCESS RIGHT  04/29/2019  . OPEN REDUCTION INTERNAL FIXATION (ORIF) DISTAL RADIAL FRACTURE Left 01/28/2016   Procedure: OPEN REDUCTION INTERNAL FIXATION (ORIF) DISTAL RADIAL FRACTURE;  Surgeon: Iran Planas, MD;  Location: [facility];  Service: Orthopedics;  Laterality: Left;  . [place] CATH N/A 12/05/2019   Procedure: RIGHT HEART CATH;  Surgeon: Nelva Bush, MD;  Location: [facility] CV LAB;  Service: Cardiovascular;  Laterality: N/A;  . TONSILLECTOMY AND ADENOIDECTOMY     age 60     OB History   No obstetric history on file.     Family History  Problem Relation Age of Onset  . Stroke Mother   . Diabetes Mother   . Kidney failure Mother   . Heart failure Mother   . Stroke Father   . Cancer Sister        Breast- 36's    Social History   Tobacco Use  . Smoking status: Current Every Day Smoker    Packs/day: 0.30    Years: 32.00    Pack years: 9.60    Types: Cigarettes    Last attempt to quit: 09/23/2012    Years  since quitting: 7.7  . Smokeless tobacco: Never Used  . Tobacco comment: 8 cigarettes/day  Vaping Use  . Vaping Use: Never used  Substance Use Topics  . Alcohol use: No  . Drug use: No    Home Medications Prior to Admission medications   Medication Sig Start Date End Date Taking? Authorizing Provider  Amino Acids-Protein Hydrolys (FEEDING SUPPLEMENT, PRO-STAT SUGAR FREE 64,) LIQD Take 30 mLs by mouth in the morning. WILD CHERRY FLAVOR   Yes [provider]  aspirin 81 MG chewable tablet Chew 81 mg by mouth daily.   Yes [provider]  atorvastatin (LIPITOR) 40 MG tablet Take 40 mg by mouth at bedtime.    Yes [provider]  cloNIDine (CATAPRES) 0.1 MG tablet Take 0.1 mg by mouth every 8 (eight) hours as needed (eye blood pressure).   Yes  [provider]  famotidine (PEPCID) 10 MG tablet Take 10 mg by mouth in the morning.   Yes [provider]  FLUoxetine (PROZAC) 10 MG capsule Take 10 mg by mouth daily.   Yes [provider]  gabapentin (NEURONTIN) 100 MG capsule Take 100 mg by mouth 2 (two) times daily. 05/16/20  Yes [provider]  guaiFENesin (ROBITUSSIN) 100 MG/5ML SOLN Take 10 mLs by mouth every 4 (four) hours as needed for cough or to loosen phlegm.   Yes [provider]  HUMALOG 100 UNIT/ML injection Inject 12 Units into the skin 3 (three) times daily with meals. 05/18/20  Yes [provider]  loratadine (CLARITIN) 10 MG tablet Take 10 mg by mouth daily.   Yes [provider]  metoprolol tartrate (LOPRESSOR) 100 MG tablet Take 100 mg by mouth 2 (two) times daily.   Yes [provider]  Multiple Vitamins-Minerals (CENTRUM SILVER 50+WOMEN) TABS Take 1 tablet by mouth in the morning.   Yes [provider]  omega-3 acid ethyl esters (LOVAZA) 1 g capsule Take 2 capsules by mouth 2 (two) times daily. 05/28/20  Yes [provider]  omeprazole (PRILOSEC) 40 MG capsule Take  40 mg by mouth daily before breakfast.    Yes [provider]  polyethylene glycol powder (GLYCOLAX/MIRALAX) 17 GM/SCOOP powder Take 17 g by mouth See admin instructions. Mix 17 grams of powder into 8 ounces of fluid, stir, and drink (by mouth) once a day   Yes [provider]  SACCHAROMYCES BOULARDII PO Take 1 capsule by mouth 2 (two) times daily.   Yes [provider]  sevelamer carbonate (RENVELA) 800 MG tablet Take 800 mg by mouth 3 (three) times daily with meals.   Yes [provider]  sodium bicarbonate 650 MG tablet Take 1,300 mg by mouth 2 (two) times daily.   Yes [provider]  senna-docusate (SENOKOT-S) 8.6-50 MG tablet Take 1 tablet by mouth at bedtime as needed for mild constipation. Patient not taking: Reported on 06/28/2020 06/11/18   Regalado, Jerald Kief A, MD  Vitamin D, Ergocalciferol, (DRISDOL) 50000 units CAPS capsule Take 1 capsule (50,000 Units total) by mouth every 7 (seven) days. Patient not taking: Reported on 06/28/2020 11/21/16   Jonetta Osgood, MD    Allergies    Hydrocodone  Review of Systems   Review of Systems  All other systems reviewed and are negative.   Physical Exam Updated Vital Signs BP (!) 136/39   Pulse 67   Temp 97.9 F (36.6 C) (Oral)   Resp 14   LMP 03/06/2013 (LMP Unknown)   SpO2 90%   Physical Exam Vitals and nursing note reviewed.  Constitutional:      Appearance: She is well-developed and well-nourished. She is ill-appearing.     Comments: Drowsy but arouses to verbal stimuli  HENT:     Head: Normocephalic and atraumatic.  Cardiovascular:     Rate and Rhythm: Normal rate and regular rhythm.     Heart sounds: No murmur heard.   Pulmonary:     Effort: Pulmonary effort is normal. No respiratory distress.     Breath sounds: Normal breath sounds.  Abdominal:     Palpations: Abdomen is soft.     Tenderness: There is no abdominal tenderness. There is no guarding or rebound.  Musculoskeletal:         General: Swelling present. No tenderness or edema.     Comments: 2+ DP pulses bilaterally. There is mild to  moderate soft tissue swelling to the right foot and ankle. There is tenderness to palpation throughout the foot and ankle. There is no knee tenderness or hip tenderness to palpation.  Skin:    General: Skin is warm and dry.  Neurological:     Mental Status: She is oriented to person, place, and time.     Comments: Generalized weakness.  Psychiatric:        Mood and Affect: Mood and affect normal.        Behavior: Behavior normal.     ED Results / Procedures / Treatments   Labs (all labs ordered are listed, but only abnormal results are displayed) Labs Reviewed  COMPREHENSIVE METABOLIC PANEL - Abnormal; Notable for the following components:      Result Value   Sodium 133 (*)    Chloride 89 (*)    Glucose, Bld 121 (*)    BUN 58 (*)    Creatinine, Ser 5.99 (*)    Calcium 8.6 (*)    Albumin 3.2 (*)    GFR, Estimated 8 (*)    Anion gap 19 (*)    All other components within normal limits  CBC WITH DIFFERENTIAL/PLATELET - Abnormal; Notable for the following components:   RBC 3.63 (*)    Hemoglobin 11.2 (*)    HCT 34.3 (*)    RDW 17.0 (*)    All other components within normal limits  POC SARS CORONAVIRUS 2 AG -  ED - Abnormal; Notable for the following components:   SARS Coronavirus 2 Ag POSITIVE (*)    All other components within normal limits    EKG EKG Interpretation  Date/Time:  Thursday June 28 2020 16:54:37 EST Ventricular Rate:  68 PR Interval:    QRS Duration: 141 QT Interval:  484 QTC Calculation: 515 R Axis:   107 Text Interpretation: Sinus rhythm Probable left atrial enlargement Right bundle branch block Borderline ST depression, lateral leads Confirmed by Quintella Reichert [phone]) on 06/28/2020 5:08:28 PM   Radiology DG Ankle Complete Right  Result Date: 06/28/2020 CLINICAL DATA:  60 year old female with fall. EXAM: RIGHT ANKLE - COMPLETE 3+  VIEW; RIGHT FOOT COMPLETE - 3+ VIEW COMPARISON:  None. FINDINGS: There is no acute fracture or dislocation. The bones are osteopenic. The ankle mortise is intact the soft tissues are unremarkable. IMPRESSION: No acute fracture or dislocation. Electronically Signed   By: Anner Crete M.D.   On: 06/28/2020 18:01   CT Head Wo Contrast  Result Date: 06/28/2020 CLINICAL DATA:  Status post trauma with altered mental status. EXAM: CT HEAD WITHOUT CONTRAST TECHNIQUE: Contiguous axial images were obtained from the base of the skull through the vertex without intravenous contrast. COMPARISON:  March 29, 2020 FINDINGS: Brain: There is mild cerebral atrophy with widening of the extra-axial spaces and ventricular dilatation. There are areas of decreased attenuation within the white matter tracts of the supratentorial brain, consistent with microvascular disease changes. Vascular: No hyperdense vessel or unexpected calcification. Skull: Normal. Negative for fracture or focal lesion. Sinuses/Orbits: There is mild bilateral ethmoid sinus mucosal thickening. Other: Mild right occipital scalp soft tissue swelling is seen. IMPRESSION: Mild right occipital scalp soft tissue swelling without evidence of an acute fracture or acute intracranial abnormality. Electronically Signed   By: Virgina Norfolk M.D.   On: 06/28/2020 18:51   DG Chest Port 1 View  Result Date: 06/28/2020 CLINICAL DATA:  COVID positive with multiple falls and shortness of breath. EXAM: PORTABLE CHEST 1 VIEW COMPARISON:  March 29, 2020 FINDINGS: The cardiac silhouette is borderline in size. There is mild calcification of the aortic arch. Both lungs are clear. The visualized skeletal structures are unremarkable. IMPRESSION: No active disease. Electronically Signed   By: Virgina Norfolk M.D.   On: 06/28/2020 18:01   DG Foot Complete Right  Result Date: 06/28/2020 CLINICAL DATA:  60 year old female with fall. EXAM: RIGHT ANKLE - COMPLETE 3+ VIEW; RIGHT  FOOT COMPLETE - 3+ VIEW COMPARISON:  None. FINDINGS: There is no acute fracture or dislocation. The bones are osteopenic. The ankle mortise is intact the soft tissues are unremarkable. IMPRESSION: No acute fracture or dislocation. Electronically Signed   By: Anner Crete M.D.   On: 06/28/2020 18:01    Procedures Procedures (including critical care time)  Medications Ordered in ED Medications - No data to display  ED Course  I have reviewed the triage vital signs and the nursing notes.  Pertinent labs & imaging results that were available during my care of the patient were reviewed by me and considered in my medical decision making (see chart for details).    MDM Rules/Calculators/A&P                         patient here from [facility] for evaluation following a fall. She does have foot swelling and tenderness, no evidence of acute fracture on imaging. No pelvic tenderness on exam. CT head is negative for serious intracranial injury. She incidentally is positive for COVID-19. No respiratory symptoms. She did miss her dialysis today. She is euvolemic on examination, potassium is within normal limits. Discussed with Dr. Carolin Sicks with nephrology -her facility can contact her dialysis facility in the morning to arrange an appropriate dialysis time for tomorrow or the next day as well as they are aware of her COVID-19 status. Discussed with patient findings of studies and treatment plan. She is in agreement with plan.  ADONAI HELZER was evaluated in Emergency Department on 06/28/2020 for the symptoms described in the history of present illness. She was evaluated in the context of the global COVID-19 pandemic, which necessitated consideration that the patient might be at risk for infection with the SARS-CoV-2 virus that causes COVID-19. Institutional protocols and algorithms that pertain to the evaluation of patients at risk for COVID-19 are in a state of rapid change based on information released  by regulatory bodies including the CDC and federal and state organizations. These policies and algorithms were followed during the patient's care in the ED.  Please note that patient did have one documented oxygen sat of 88%. This appears to be inaccurate as patient appear to be maintaining sats in the mid to upper 90s throughout the majority of her ED stay. Lungs are clear on examination with no respiratory complaints. Final Clinical Impression(s) / ED Diagnoses Final diagnoses:  COVID-19 virus infection  Sprain of deltoid ligament of right ankle, initial encounter  Fall, initial encounter  ESRD (end stage renal disease) on dialysis [facility])    Rx / DC Orders ED Discharge Orders    None       Quintella Reichert, MD 06/28/20 2043

## 2020-06-28 NOTE — Discharge Instructions (Signed)
Your COVID-19 test was positive today. You need to isolate per CDC guidelines. You may take Tylenol or ibuprofen for fever according to label instructions. Please contact your dialysis center in the morning to let them know of your COVID-19 status so they can adjust your dialysis times.

## 2020-06-28 NOTE — ED Notes (Addendum)
Report given to RN at [facility] facility.

## 2020-06-28 NOTE — ED Notes (Signed)
[facility] called  

## 2020-06-29 ENCOUNTER — Telehealth: Payer: Self-pay

## 2020-06-29 NOTE — ED Notes (Signed)
Pt was placed on 4L [place]. When sleeping pt O2 dropped. Pt states that she has sleep apnea. Pt is maintaining at 100% on 4L while sleeping. Respirations are equal and chest rise and fall is noted. Pt is still waiting for [facility] to take back to the facility at this time.

## 2020-06-29 NOTE — Telephone Encounter (Signed)
Pt. Is currently in ED. Unable to reach pt. For [facility] pre-screening.

## 2020-06-29 NOTE — ED Notes (Signed)
Spoke To [facility] about transport. Unit manager Mon stated she is trying to contact a transport service to get pt back to facility d/t [facility] refusing.

## 2020-06-29 NOTE — ED Notes (Signed)
Pt refused by [facility], due to "not meeting medical necessity requirement". The writer called facility 3 times to see if they can have their transport [facility] come and get them and the person that answered the phone was unable to provide me with an answer. Writer left number with them to have them call back when they obtained the information needed for this pt to go back to the facility.

## 2020-07-31 ENCOUNTER — Other Ambulatory Visit: Payer: Self-pay | Admitting: Internal Medicine

## 2020-07-31 ENCOUNTER — Other Ambulatory Visit (HOSPITAL_COMMUNITY): Payer: Self-pay | Admitting: Internal Medicine

## 2020-08-01 ENCOUNTER — Ambulatory Visit (INDEPENDENT_AMBULATORY_CARE_PROVIDER_SITE_OTHER): Payer: Medicare Other | Admitting: Neurology

## 2020-08-01 ENCOUNTER — Encounter: Payer: Self-pay | Admitting: Neurology

## 2020-08-01 VITALS — BP 163/68 | Ht <= 58 in | Wt 102.0 lb

## 2020-08-01 DIAGNOSIS — R404 Transient alteration of awareness: Secondary | ICD-10-CM | POA: Diagnosis not present

## 2020-08-01 DIAGNOSIS — R296 Repeated falls: Secondary | ICD-10-CM | POA: Diagnosis not present

## 2020-08-01 DIAGNOSIS — R202 Paresthesia of skin: Secondary | ICD-10-CM

## 2020-08-01 MED ORDER — TOPIRAMATE 25 MG PO TABS: 25.0000 mg | ORAL_TABLET | Freq: Every day | ORAL | 2 refills | Status: DC

## 2020-08-01 NOTE — Progress Notes (Signed)
[facility] [address] [place]. [place] [address] [phone]       OFFICE CONSULT NOTE  [name] Date of Birth:  1961-01-11 Medical Record Number:  [id number]   Referring MD: Fanny Skates Reason for Referral: TIA HPI: [name] Michael is a 60 year old Caucasian lady seen today for initial office consultation visit.  She is accompanied by her CNA from [facility] skilled nursing facility where she lives.  History is obtained from her and review of electronic medical records and personally reviewed pertinent imaging films in PACS.[name] Michael is an 60 y.o. female with ESRD on HD, depression, HLD, HTN, osteoporosis, left frontal subcortical stroke, TIA, tobacco abuse, DM2 and vitamin D deficiency, who presents acutely via EMS from her hemodialysis center for acute onset of AMS. She was reportedly at her normal baseline when she arrived at the dialysis center. During dialysis she began to exhibit somnolence and slurred speech. The dialysis session was continued for another hour and then EMS was called. On arrival she was lethargic with dysarthria and disorientation - she was unable to state where she was and also was disoriented to time. No focal weakness was noted by EMS. She did not exhibit any seizure-like activity per EMS. On arrival to the ED she continued to exhibit AMS and was lethargic/somnolent.  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  Patient was started on aspirin and a statin.  Patient states that since then she has had frequent falls.  Most of the falls are related to change in body position particularly when she tried to get out of bed.  She was seen in the ER on 06/28/2020 for 1 such fall when she had a scalp hematoma and CT scan of the head showed no acute abnormality.  Patient does  have longstanding diabetes and likely diabetic neuropathy.  She complains of tingling numbness paresthesias paresthesias in the feet all the time and at times she cannot sleep because of this.  She has not tried medications like gabapentin or Topamax yet.  She is on dialysis for last 2 years.  She has been living in a nursing home.  She is walking with a walker and has to be careful but still has had a few falls.  She states her falls are sudden she has no warning.  She does not lose consciousness.  There is no witnessed seizure-like activity noted. ROS:   14 system review of systems is positive for falls, back pain, tingling, numbness, slurred speech, sleepiness, tiredness and all other systems negative  PMH:  Past Medical History:  Diagnosis Date  . Ambulates with cane   . Constipation   . Depression   . ESRD (end stage renal disease) ([place])    a. TTS Dialysis.  Marland Kitchen GERD (gastroesophageal reflux disease)   . History of stress test    a. 01/2003 MV: EF 74%, no ischemia/infarct.  . Hyperlipidemia   . Hypertension   . Osteoporosis   . Stroke [facility]) 04-01-11   left frontal subcortical, saw Dr. [name]   . Syncope 11/2019  . TIA (transient ischemic attack) 03-12-11  . Tobacco abuse   . Tricuspid regurgitation    a. 05/2016 Echo: EF 65-70%, Gr2DD, mild MR, nl RV fxn, Triv TR, PASP 9mHg; b. 05/2018 Echo: EF 60-65%, Gr2DD,  mild MR/TR, RVSP/PASP 58mHg; c. 11/2019 Echo: EF 55-60%, no rwma, mild-mod MR, Sev TR w/ RV dilatation (CTA chest neg for PE).  . [place] II diabetes mellitus [place]   . Vitamin D deficiency     Social History:  Social History   Socioeconomic History  . Marital status: Single    Spouse name: Not on file  . Number of children: Not on file  . Years of education: Not on file  . Highest education level: Not on file  Occupational History  . Not on file  Tobacco Use  . Smoking status: Current Every Day Smoker    Packs/day: 0.30    Years: 32.00    Pack years: 9.60    Types:  Cigarettes    Last attempt to quit: 09/23/2012    Years since quitting: 7.8  . Smokeless tobacco: Never Used  . Tobacco comment: 8 cigarettes/day  Vaping Use  . Vaping Use: Never used  Substance and Sexual Activity  . Alcohol use: No  . Drug use: No  . Sexual activity: Not Currently    Birth control/protection: Post-menopausal  Other Topics Concern  . Not on file  Social History Narrative   [facility]    Right Handed   Drinks 2-4 cups caffeine daily      Social Determinants of Health   Financial Resource Strain: Not on file  Food Insecurity: Not on file  Transportation Needs: Not on file  Physical Activity: Not on file  Stress: Not on file  Social Connections: Not on file  Intimate Partner Violence: Not on file    Medications:   Current Outpatient Medications on File Prior to Visit  Medication Sig Dispense Refill  . Amino Acids-Protein Hydrolys (FEEDING SUPPLEMENT, PRO-STAT SUGAR FREE 64,) LIQD Take 30 mLs by mouth in the morning. WILD CHERRY FLAVOR    . aspirin 81 MG chewable tablet Chew 81 mg by mouth daily.    .Marland Kitchenatorvastatin (LIPITOR) 40 MG tablet Take 40 mg by mouth at bedtime.     . cloNIDine (CATAPRES) 0.1 MG tablet Take 0.1 mg by mouth every 8 (eight) hours as needed (eye blood pressure).    . famotidine (PEPCID) 10 MG tablet Take 10 mg by mouth in the morning.    .Marland KitchenFLUoxetine (PROZAC) 10 MG capsule Take 10 mg by mouth daily.    .Marland Kitchengabapentin (NEURONTIN) 100 MG capsule Take 100 mg by mouth 2 (two) times daily.    .Marland KitchenguaiFENesin (ROBITUSSIN) 100 MG/5ML SOLN Take 10 mLs by mouth every 4 (four) hours as needed for cough or to loosen phlegm.    .Marland KitchenHUMALOG 100 UNIT/ML injection Inject 12 Units into the skin 3 (three) times daily with meals.    .Marland Kitchenloratadine (CLARITIN) 10 MG tablet Take 10 mg by mouth daily.    . metoprolol tartrate (LOPRESSOR) 100 MG tablet Take 100 mg by mouth 2 (two) times daily.    . Multiple Vitamins-Minerals (CENTRUM  SILVER 50+WOMEN) TABS Take 1 tablet by mouth in the morning.    .Marland Kitchenomega-3 acid ethyl esters (LOVAZA) 1 g capsule Take 2 capsules by mouth 2 (two) times daily.    .Marland Kitchenomeprazole (PRILOSEC) 40 MG capsule Take 40 mg by mouth daily before breakfast.     . polyethylene glycol powder (GLYCOLAX/MIRALAX) 17 GM/SCOOP powder Take 17 g by mouth See admin instructions. Mix 17 grams of powder into 8 ounces of fluid, stir, and drink (by mouth) once a day    .  SACCHAROMYCES BOULARDII PO Take 1 capsule by mouth 2 (two) times daily.    Marland Kitchen senna-docusate (SENOKOT-S) 8.6-50 MG tablet Take 1 tablet by mouth at bedtime as needed for mild constipation. 30 tablet 0  . sevelamer carbonate (RENVELA) 800 MG tablet Take 800 mg by mouth 3 (three) times daily with meals.    . sodium bicarbonate 650 MG tablet Take 1,300 mg by mouth 2 (two) times daily.    . Vitamin D, Ergocalciferol, (DRISDOL) 50000 units CAPS capsule Take 1 capsule (50,000 Units total) by mouth every 7 (seven) days. 30 capsule 0   No current facility-administered medications on file prior to visit.    Allergies:   Allergies  Allergen Reactions  . Hydrocodone Nausea And Vomiting and Other (See Comments)    "Allergic," per [facility]    Physical Exam General: Frail petite malnourished looking middle-aged Caucasian lady, seated, in no evident distress Head: head normocephalic and atraumatic.   Neck: supple with no carotid or supraclavicular bruits Cardiovascular: regular rate and rhythm, soft ejection systolic murmur.   Musculoskeletal: Mild kyphoscoliosis skin:  no rash/petichiae Vascular:  Normal pulses all extremities  Neurologic Exam Mental Status: Awake and fully alert. Oriented to place and time. Recent and remote memory intact. Attention span, concentration and fund of knowledge appropriate. Mood and affect appropriate.  Cranial Nerves: Fundoscopic exam difficult through undilated pupils..  Right pupil 3 mm reactive left 2 mm not reactive.  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  Extraocular movements full without nystagmus. Visual fields full to confrontation. Hearing intact. Facial sensation intact. Face, tongue, palate moves normally and symmetrically.  Motor: Normal bulk and tone. Normal strength in all tested extremity muscles except mild symmetric weakness of hip flexors and ankle dorsiflexors.. Sensory.: intact to touch , pinprick , but mildly impaired position and vibratory sensation in both feet from ankle down..  Coordination: Rapid alternating movements normal in all extremities. Finger-to-nose and heel-to-shin performed accurately bilaterally. Gait and Station: Arises from chair without difficulty. Stance is stooped gait demonstrates broad base and mild imbalance.  Favors her back due to pain..  Uses a walker.  Unable to stand on either foot unsupported and on her heels. Reflexes: 1+ and symmetric in upper extremities and slightly depressed in both lower extremities.. Toes downgoing.   NIHSS 0 Modified Rankin 2   ASSESSMENT: 60 year old Caucasian lady with transient episode of altered mental status and slurred speech during dialysis unclear whether related to fluid shift, hypertension or possibly TIA versus seizure in October 2021.  Recent increased frequency of falls likely multifactorial due to combination of diabetic neuropathy, poor vision degenerative arthritis.     PLAN: I had a long discussion with the patient and her CNA from [facility] regarding her episode of transient sleepiness altered consciousness and slurred speech representing possibly a TIA versus seizure.  She also has increased frequency of falls and chronic lower extremity paresthesias which likely from underlying diabetic neuropathy.  Recommend trial of Topamax 25 mg twice daily.  We also discussed fall precautions.  I advised the primary physician at the nursing home to address her back pain which seems to be limiting her ambulation  at the present time.  She will return for follow-up in the future only as necessary.  Greater than 50% time during this 45-minute consultation visit was spent on counseling and coordination of care about her episodes of altered consciousness, TIA versus seizure as well as discussion with [place]  [address] [facility] [address] [place], [place] [address]  Phone [phone] Fax [phone] Note: This document was prepared with digital dictation and possible smart phrase technology. Any transcriptional errors that result from this process are unintentional.

## 2020-08-01 NOTE — Patient Instructions (Signed)
I had a long discussion with the patient and her CNA from [facility] regarding her episode of transient sleepiness altered consciousness and slurred speech representing possibly a TIA versus seizure.  She also has increased frequency of falls and chronic lower extremity paresthesias which likely from underlying diabetic neuropathy.  Recommend trial of Topamax 25 mg twice daily.  We also discussed fall precautions.  I advised the primary physician at the nursing home to address her back pain which seems to be limiting her ambulation at the present time.  She will return for follow-up in the future only as necessary.  Fall Prevention in Hospitals, Adult Being a patient in the hospital puts you at risk for falling. Falls can cause serious injury and harm, but they can be prevented. It is important to understand what puts you at risk for falling and what you and your health care team can do to prevent you from falling. If you or a loved one falls at the hospital, it is important to tell hospital staff about it. What increases the risk for falls? Certain conditions and treatments may increase your risk of falling in the hospital. These include:  Being in an unfamiliar environment, especially when using the bathroom at night.  Having surgery.  Being on bed rest.  Taking many medicines or certain types of medicines, such as sleeping pills.  Having tubes in place, such as IV lines or catheters. Other risk factors for falls in a hospital include:  Having difficulty with hearing or vision.  Having a change in thinking or behavior, such as confusion.  Having depression.  Having trouble with balance.  Being a female.  Feeling dizzy.  Needing to use the toilet frequently.  Having fallen during the past three months.  Having low blood pressure. What are some strategies for preventing falls? If you or a loved one has to stay in the hospital:  Ask about which fall prevention strategies will be  in place. Do not hesitate to speak up if you notice that the fall prevention plan has changed.  Ask for help moving around, especially after surgery or when feeling unwell.  If you have been asked to call for help when getting up, do not get up by yourself. Asking for help with getting up is for your safety, and the staff is there to help you.  Wear nonskid footwear.  Get up slowly, and sit at the side of the bed for a few minutes before standing up.  Keep items you need, such as the nurse call button or a phone, close to you so that you do not need to reach for them.  Wear eyeglasses or hearing aids if you have them.  Have someone stay in the hospital with you or your loved one.  Ask if sleeping pills or other medicines that can cause confusion are necessary.   What does the hospital staff do to help prevent falls? Hospitals have systems in place to prevent falls and accidents, which may involve:  Discussing your fall risks and making a personalized fall prevention plan.  Checking in regularly to see if you need help.  Placing an arm band on your wrist or a sign near your room to alert other staff of your needs.  Using an alarm on your hospital bed. This is an alarm that goes off if you get out of bed and forget to call for help.  Keeping the bed in a low and locked position.  Keeping the area  around the bed and bathroom well-lit and free from clutter.  Keeping your room quiet, so that you can sleep and be well-rested.  Using safety equipment, such as: ? A belt around your waist. ? Walkers, crutches, and other devices for support. ? Safety beds, such as low beds or cushions on the floor next to the bed.  Having a staff person stay with you (one-on-one observation), even when you are using the bathroom. This is for your safety.  Using video monitoring. This allows a staff member to come to help you if you need help.   What other actions can I take to lower my risk of  falls?  Check in regularly with your health care provider or pharmacist to review all of the medicines that you take.  Make sure that you have a regular exercise program to stay fit. This will help you maintain your balance.  Talk with a physical therapist or trainer if recommended by your health care provider. They can help you to improve your strength, balance, and endurance.  If you are over age 47: ? Ask your health care provider if you need a calcium or vitamin D supplement. ? Have your eyes and hearing checked every year. ? Have your feet checked every year. Where to find more information You can find more information about fall prevention from the Centers for Disease Control and Prevention: ImproveLook.cz Summary  Being in an unfamiliar environment, such as the hospital, increases your risk for falling.  If you have been asked to call for help when getting up, do not get up by yourself. Asking for help with getting up is for your safety, and the staff is there to help you.  Ask about which fall prevention strategies will be in place. Do not hesitate to speak up if you notice that the fall prevention plan has changed.  If you or a loved one falls, tell the hospital staff. This is important. This information is not intended to replace advice given to you by your health care provider. Make sure you discuss any questions you have with your health care provider. Document Revised: 05/22/2017 Document Reviewed: 01/21/2017 Elsevier Patient Education  2021 [employer].

## 2020-08-07 ENCOUNTER — Telehealth: Payer: Self-pay

## 2020-08-07 NOTE — Telephone Encounter (Signed)
Received message from PCP requesting Palliative care to see patient when possible to further discussion on GOC. Primary Palliative NP made aware.

## 2020-08-09 ENCOUNTER — Other Ambulatory Visit: Payer: Self-pay | Admitting: Internal Medicine

## 2020-08-09 ENCOUNTER — Other Ambulatory Visit (HOSPITAL_COMMUNITY): Payer: Self-pay | Admitting: Internal Medicine

## 2020-08-09 DIAGNOSIS — M545 Low back pain, unspecified: Secondary | ICD-10-CM

## 2020-08-10 ENCOUNTER — Non-Acute Institutional Stay: Payer: Self-pay | Admitting: Hospice

## 2020-08-10 ENCOUNTER — Other Ambulatory Visit: Payer: Self-pay

## 2020-08-10 DIAGNOSIS — Z515 Encounter for palliative care: Secondary | ICD-10-CM

## 2020-08-10 DIAGNOSIS — N185 Chronic kidney disease, stage 5: Secondary | ICD-10-CM

## 2020-08-10 DIAGNOSIS — R269 Unspecified abnormalities of gait and mobility: Secondary | ICD-10-CM

## 2020-08-10 NOTE — Progress Notes (Addendum)
[facility] Consult Note Telephone: [phone]  Fax: [phone]  PATIENT NAME: [name] Michael [place] [id number] [phone] (home)  DOB: 1960/08/02 MRN: [id number]  PRIMARY CARE PROVIDER:    Charlott Rakes, MD,  [place] [address] [phone]  REFERRING PROVIDER:   Dr Felicita Gage Mammie Lorenzo NP   RESPONSIBLE PARTY: Self   Extended Emergency Contact Information Primary Emergency Contact: [place] Phone: [phone] Relation: Sister Secondary Emergency Contact: [place] Phone: [phone] Mobile Phone: [phone] Relation: Niece  CHIEF COMPLAIN: Initial palliative care visit/Gait disturbance  Visit is to build trust and highlight Palliative Medicine as specialized medical care for people living with serious illness, aimed at facilitating better quality of life through symptoms relief, assisting with advance care plan and establishing goals of care.  Discussion on the difference between Palliative and Hospice care.  Visit consisted of counseling and education dealing with the complex and emotionally intense issues of symptom management and palliative care in the setting of serious and potentially life-threatening illness. Palliative care team will continue to support patient, patient's family, and medical team.  RECOMMENDATIONS/PLAN:   Advance Care Planning: Our advance care planning conversation included a discussion about  the value and importance of advance care planning, exploration of goals of care in the event of a sudden injury or illness, identification and preparation of a healthcare agent and review and updating or creation of an advance directive document. Called and spoke with [name] updating her on the visit. She expressed appreciation for the update and for palliative  service.  CODE STATUS: Patient affirmed she is a DO NOT RESUSCITATE.   GOALS OF CARE: Goals of care include to maximize quality of life and symptom management. She said she does not want hospice but will continue with dialysis as planned. She hope to get stronger and stop having falls.    I spent 46  minutes providing this consultation. More than 50% of the time in this consultation was spent in counseling and care coordination. ____________________________________________________________________________  Symptom Management/Plan:  Gait disturbance: Continue on PT which is ongoing. Balance of rest and performance activity. Use of assistive device for support to help prevent a fall. Continue Doxycycline to finish for left under arm boil; well tolerated Palliative will continue to monitor for symptom management/decline and make recommendations as needed. Return 6 weeks or prn. Encouraged to call provider sooner with any concerns.   PPS: 50%  HOSPICE ELIGIBILITY/DIAGNOSIS: TBD  HISTORY OF PRESENT ILLNESS:  [name] Michael is a 60 y.o. female with multiple medical problems including gait disturbance which is chronic, worsened in the past month with recurring falls and not able to get around as desired. She was seen in the ED 06/28/2020 for fall. Chart review in Epic shows no evidence of acute fracture on imaging, No pelvic tenderness on exam. CT head is negative for serious intracranial injury. Gait disturbance impairs her activities of daily living; use of assistive device is helpful. History  of ESRD on dialysis Mon Wed and Fri, CVA, CHF, positive COVID-19 viral infection without respiratory symptoms. History obtained from review of EMR, discussion with primary team/primary RN and interview with patient/family. Review and summarization of old Epic records shows history from other than patient. Rest of 10 point system reviewed and negative.  Palliative Care was asked to follow this patient by  consultation request of Dr Mellissa Kohut  NP  to help address complex decision making in the context of goals of care.   Review lab tests/diagnostics  Results for EMONNI, RANTA (MRN [id number]) as of 08/10/2020 11:08  Ref. Range 06/28/2020 16:54 06/28/2020 17:40 06/28/2020 17:51 06/28/2020 18:23 06/28/2020 18:43  COMPREHENSIVE METABOLIC PANEL Unknown    Rpt (A)   Sodium Latest Ref Range: 135 - 145 mmol/L    133 (L)   Potassium Latest Ref Range: 3.5 - 5.1 mmol/L    4.1   Chloride Latest Ref Range: 98 - 111 mmol/L    89 (L)   CO2 Latest Ref Range: 22 - 32 mmol/L    25   Glucose Latest Ref Range: 70 - 99 mg/dL    121 (H)   BUN Latest Ref Range: 6 - 20 mg/dL    58 (H)   Creatinine Latest Ref Range: 0.44 - 1.00 mg/dL    5.99 (H)   Calcium Latest Ref Range: 8.9 - 10.3 mg/dL    8.6 (L)   Anion gap Latest Ref Range: 5 - 15     19 (H)   Alkaline Phosphatase Latest Ref Range: 38 - 126 U/L    63   Albumin Latest Ref Range: 3.5 - 5.0 g/dL    3.2 (L)   AST Latest Ref Range: 15 - 41 U/L    20   ALT Latest Ref Range: 0 - 44 U/L    15   Total Protein Latest Ref Range: 6.5 - 8.1 g/dL    7.4   Total Bilirubin Latest Ref Range: 0.3 - 1.2 mg/dL    0.7   GFR, Estimated Latest Ref Range: >60 mL/min    8 (L)   WBC Latest Ref Range: 4.0 - 10.5 K/uL    9.2   RBC Latest Ref Range: 3.87 - 5.11 MIL/uL    3.63 (L)   Hemoglobin Latest Ref Range: 12.0 - 15.0 g/dL    11.2 (L)   HCT Latest Ref Range: 36.0 - 46.0 %    34.3 (L)   MCV Latest Ref Range: 80.0 - 100.0 fL    94.5   MCH Latest Ref Range: 26.0 - 34.0 pg    30.9   MCHC Latest Ref Range: 30.0 - 36.0 g/dL    32.7   RDW Latest Ref Range: 11.5 - 15.5 %    17.0 (H)   Platelets Latest Ref Range: 150 - 400 K/uL    264   nRBC Latest Ref Range: 0.0 - 0.2 %    0.0   Neutrophils Latest Units: %    78   Lymphocytes Latest Units: %    13   Monocytes Relative Latest Units: %    8   Eosinophil Latest Units: %    1   Basophil Latest Units: %    0   Immature  Granulocytes Latest Units: %    0   NEUT# Latest Ref Range: 1.7 - 7.7 K/uL    7.1   Lymphocyte # Latest Ref Range: 0.7 - 4.0 K/uL    1.2   Monocyte # Latest Ref Range: 0.1 - 1.0 K/uL    0.7   Eosinophils Absolute Latest Ref Range: 0.0 - 0.5 K/uL    0.1   Basophils Absolute Latest Ref Range: 0.0 - 0.1 K/uL    0.0   Abs Immature Granulocytes Latest Ref Range: 0.00 - 0.07 K/uL    0.04   POC SARS CORONAVIRUS 2 AG -  ED Unknown  Rpt (A)    SARS Coronavirus 2 Ag Latest Ref Range: NEGATIVE    POSITIVE (A)    CT HEAD WO CONTRAST Unknown     Rpt  DG ANKLE COMPLETE RIGHT Unknown  Rpt     DG CHEST PORT 1 VIEW Unknown  Rpt     DG FOOT COMPLETE RIGHT Unknown  Rpt     ED EKG Unknown Rpt       No components found for: ALB No results for input(s): APTT, INR in the last 168 hours.  Invalid input(s): PTPATIENT No results for input(s): BNP, PROBNP in the last 168 hours.  PAST MEDICAL HISTORY:  Past Medical History:  Diagnosis Date  . Ambulates with cane   . Constipation   . Depression   . ESRD (end stage renal disease) ([place])    a. TTS Dialysis.  Marland Kitchen GERD (gastroesophageal reflux disease)   . History of stress test    a. 01/2003 MV: EF 74%, no ischemia/infarct.  . Hyperlipidemia   . Hypertension   . Osteoporosis   . Stroke [facility]) 04-01-11   left frontal subcortical, saw Dr. [name]   . Syncope 11/2019  . TIA (transient ischemic attack) 03-12-11  . Tobacco abuse   . Tricuspid regurgitation    a. 05/2016 Echo: EF 65-70%, Gr2DD, mild MR, nl RV fxn, Triv TR, PASP 5mHg; b. 05/2018 Echo: EF 60-65%, Gr2DD, mild MR/TR, RVSP/PASP 359mg; c. 11/2019 Echo: EF 55-60%, no rwma, mild-mod MR, Sev TR w/ RV dilatation (CTA chest neg for PE).  . Type II diabetes mellitus (HCOak [place]  . Vitamin D deficiency      SOCIAL HX: Patient lives in SNF for ongoing care Smoked for 40 years half a pack a day. Smoking cessation discussion. Patient agreeable to cut back for a start. Social History   Tobacco Use  . Smoking  status: Current Every Day Smoker    Packs/day: 0.30    Years: 32.00    Pack years: 9.60    Types: Cigarettes    Last attempt to quit: 09/23/2012    Years since quitting: 7.8  . Smokeless tobacco: Never Used  . Tobacco comment: 8 cigarettes/day  Substance Use Topics  . Alcohol use: No    FAMILY HX:  Family History  Problem Relation Age of Onset  . Stroke Mother   . Diabetes Mother   . Kidney failure Mother   . Heart failure Mother   . Stroke Father   . Cancer Sister        Breast- [id number]'s  ALLERGIES:  Allergies  Allergen Reactions  . Hydrocodone Nausea And Vomiting and Other (See Comments)    [facility]    PERTINENT MEDICATIONS:  Outpatient Encounter Medications as of 08/10/2020  Medication Sig  . Amino Acids-Protein Hydrolys (FEEDING SUPPLEMENT, PRO-STAT SUGAR FREE 64,) LIQD Take 30 mLs by mouth in the morning. WILD CHERRY FLAVOR  . aspirin 81 MG chewable tablet Chew 81 mg by mouth daily.  . Marland Kitchentorvastatin (LIPITOR) 40 MG tablet Take 40 mg by mouth at bedtime.   . cloNIDine (CATAPRES) 0.1 MG tablet Take 0.1 mg by mouth every 8 (eight) hours as needed (eye blood pressure).  . famotidine (PEPCID) 10 MG tablet Take 10 mg by mouth in the morning.  . Marland KitchenLUoxetine (PROZAC) 10 MG capsule Take 10 mg by mouth daily.  . Marland Kitchenabapentin (NEURONTIN) 100 MG capsule Take 100 mg by mouth 2 (two) times daily.  . Marland KitchenuaiFENesin (ROBITUSSIN) 100 MG/5ML SOLN  Take 10 mLs by mouth every 4 (four) hours as needed for cough or to loosen phlegm.  Marland Kitchen HUMALOG 100 UNIT/ML injection Inject 12 Units into the skin 3 (three) times daily with meals.  Marland Kitchen loratadine (CLARITIN) 10 MG tablet Take 10 mg by mouth daily.  . metoprolol tartrate (LOPRESSOR) 100 MG tablet Take 100 mg by mouth 2 (two) times daily.  . Multiple Vitamins-Minerals (CENTRUM SILVER 50+WOMEN) TABS Take 1 tablet by mouth in the morning.  Marland Kitchen omega-3 acid ethyl esters (LOVAZA) 1 g capsule Take 2 capsules by mouth 2 (two) times daily.  Marland Kitchen  omeprazole (PRILOSEC) 40 MG capsule Take 40 mg by mouth daily before breakfast.   . polyethylene glycol powder (GLYCOLAX/MIRALAX) 17 GM/SCOOP powder Take 17 g by mouth See admin instructions. Mix 17 grams of powder into 8 ounces of fluid, stir, and drink (by mouth) once a day  . SACCHAROMYCES BOULARDII PO Take 1 capsule by mouth 2 (two) times daily.  Marland Kitchen senna-docusate (SENOKOT-S) 8.6-50 MG tablet Take 1 tablet by mouth at bedtime as needed for mild constipation.  . sevelamer carbonate (RENVELA) 800 MG tablet Take 800 mg by mouth 3 (three) times daily with meals.  . sodium bicarbonate 650 MG tablet Take 1,300 mg by mouth 2 (two) times daily.  Marland Kitchen topiramate (TOPAMAX) 25 MG tablet Take 1 tablet (25 mg total) by mouth at bedtime.  . Vitamin D, Ergocalciferol, (DRISDOL) 50000 units CAPS capsule Take 1 capsule (50,000 Units total) by mouth every 7 (seven) days.   No facility-administered encounter medications on file as of 08/10/2020.    ROS  General: NAD Constitution: Denies fever/chills EYES: denies vision changes, blind in left eye ENMT: denies Xerostomia,  dysphagia Cardiovascular: denies chest pain Pulmonary: denies  cough, dyspnea  Abdomen: endorses fair appetite, denies constipation or diarrhea GU: denies dysuria, urinary frequency MSK:  endorses ROM limitations, unsteadiness on feet Skin: denies rashes/bruising; report of left underarm boil- resolving Neurological: endorses weakness, denies pain, insomnia Psych: Endorses positive mood Heme/lymph/immuno: denies bruises,  abnormal bleeding   PHYSICAL EXAM  Height:   4 feet 6 inches    Weight: 102Ibs General: In no acute distress Cardiovascular: regular rate and rhythm Pulmonary: no cough, no increased work of breathing, normal respiratory effort Abdomen: soft, non tender, positive bowel sounds in all quadrants GU:  no suprapubic tenderness Eyes: Normal lids, no discharge, sclera anicteric ENMT: Moist mucous  membranes Musculoskeletal:  no edema in BLE, ambulatory with rolling walker, unsteady gait Skin: no rash to visible skin, dry skin, warm without cyanosis Psych: non-anxious affect Neurological: Weakness but otherwise non focal Heme/lymph/immuno: no bruises, no bleeding  Thank you for the opportunity to participate in the care of CAMY HASLIP Please call our office at [phone] if we can be of additional assistance.  Note: Portions of this note were generated with [employer]. Dictation errors may occur despite best attempts at proofreading.  Teodoro Spray, NP

## 2020-08-16 ENCOUNTER — Emergency Department (HOSPITAL_COMMUNITY)
Admission: EM | Admit: 2020-08-16 | Discharge: 2020-08-16 | Disposition: A | Discharge status: Home / Self Care | Payer: Medicare Other | Attending: Emergency Medicine | Admitting: Emergency Medicine

## 2020-08-16 DIAGNOSIS — I132 Hypertensive heart and chronic kidney disease with heart failure and with stage 5 chronic kidney disease, or end stage renal disease: Secondary | ICD-10-CM | POA: Insufficient documentation

## 2020-08-16 DIAGNOSIS — Z8673 Personal history of transient ischemic attack (TIA), and cerebral infarction without residual deficits: Secondary | ICD-10-CM | POA: Insufficient documentation

## 2020-08-16 DIAGNOSIS — E11311 Type 2 diabetes mellitus with unspecified diabetic retinopathy with macular edema: Secondary | ICD-10-CM | POA: Diagnosis not present

## 2020-08-16 DIAGNOSIS — Z79899 Other long term (current) drug therapy: Secondary | ICD-10-CM | POA: Diagnosis not present

## 2020-08-16 DIAGNOSIS — Z992 Dependence on renal dialysis: Secondary | ICD-10-CM | POA: Diagnosis not present

## 2020-08-16 DIAGNOSIS — Z7982 Long term (current) use of aspirin: Secondary | ICD-10-CM | POA: Diagnosis not present

## 2020-08-16 DIAGNOSIS — F17210 Nicotine dependence, cigarettes, uncomplicated: Secondary | ICD-10-CM | POA: Insufficient documentation

## 2020-08-16 DIAGNOSIS — Z794 Long term (current) use of insulin: Secondary | ICD-10-CM | POA: Insufficient documentation

## 2020-08-16 DIAGNOSIS — E1122 Type 2 diabetes mellitus with diabetic chronic kidney disease: Secondary | ICD-10-CM | POA: Diagnosis not present

## 2020-08-16 DIAGNOSIS — N186 End stage renal disease: Secondary | ICD-10-CM | POA: Insufficient documentation

## 2020-08-16 DIAGNOSIS — I5032 Chronic diastolic (congestive) heart failure: Secondary | ICD-10-CM | POA: Insufficient documentation

## 2020-08-16 DIAGNOSIS — T82838A Hemorrhage of vascular prosthetic devices, implants and grafts, initial encounter: Secondary | ICD-10-CM | POA: Insufficient documentation

## 2020-08-16 LAB — I-STAT CHEM 8, ED
BUN: 8 mg/dL (ref 6–20)
Calcium, Ion: 0.92 mmol/L — ABNORMAL LOW (ref 1.15–1.40)
Chloride: 97 mmol/L — ABNORMAL LOW (ref 98–111)
Creatinine, Ser: 1.7 mg/dL — ABNORMAL HIGH (ref 0.44–1.00)
Glucose, Bld: 136 mg/dL — ABNORMAL HIGH (ref 70–99)
HCT: 40 % (ref 36.0–46.0)
Hemoglobin: 13.6 g/dL (ref 12.0–15.0)
Potassium: 3.2 mmol/L — ABNORMAL LOW (ref 3.5–5.1)
Sodium: 140 mmol/L (ref 135–145)
TCO2: 30 mmol/L (ref 22–32)

## 2020-08-16 LAB — CBC WITH DIFFERENTIAL/PLATELET
Abs Immature Granulocytes: 0.01 10*3/uL (ref 0.00–0.07)
Basophils Absolute: 0 10*3/uL (ref 0.0–0.1)
Basophils Relative: 1 %
Eosinophils Absolute: 0.1 10*3/uL (ref 0.0–0.5)
Eosinophils Relative: 3 %
HCT: 39.5 % (ref 36.0–46.0)
Hemoglobin: 12 g/dL (ref 12.0–15.0)
Immature Granulocytes: 0 %
Lymphocytes Relative: 25 %
Lymphs Abs: 1.1 10*3/uL (ref 0.7–4.0)
MCH: 29.1 pg (ref 26.0–34.0)
MCHC: 30.4 g/dL (ref 30.0–36.0)
MCV: 95.9 fL (ref 80.0–100.0)
Monocytes Absolute: 0.6 10*3/uL (ref 0.1–1.0)
Monocytes Relative: 14 %
Neutro Abs: 2.6 10*3/uL (ref 1.7–7.7)
Neutrophils Relative %: 57 %
Platelets: 168 10*3/uL (ref 150–400)
RBC: 4.12 MIL/uL (ref 3.87–5.11)
RDW: 18.3 % — ABNORMAL HIGH (ref 11.5–15.5)
WBC: 4.5 10*3/uL (ref 4.0–10.5)
nRBC: 0 % (ref 0.0–0.2)

## 2020-08-16 LAB — BASIC METABOLIC PANEL
Anion gap: 12 (ref 5–15)
BUN: 8 mg/dL (ref 6–20)
CO2: 31 mmol/L (ref 22–32)
Calcium: 8.9 mg/dL (ref 8.9–10.3)
Chloride: 97 mmol/L — ABNORMAL LOW (ref 98–111)
Creatinine, Ser: 1.84 mg/dL — ABNORMAL HIGH (ref 0.44–1.00)
GFR, Estimated: 31 mL/min — ABNORMAL LOW (ref >60)
Glucose, Bld: 129 mg/dL — ABNORMAL HIGH (ref 70–99)
Potassium: 3.5 mmol/L (ref 3.5–5.1)
Sodium: 140 mmol/L (ref 135–145)

## 2020-08-16 MED ORDER — HYDRALAZINE HCL 20 MG/ML IJ SOLN
10.0000 mg | Freq: Once | INTRAMUSCULAR | Status: AC
  Administered 2020-08-16: 10 mg via INTRAVENOUS
  Filled 2020-08-16: qty 1

## 2020-08-16 NOTE — ED Notes (Signed)
[facility] CALLED PT IS NUMBER 4 ON THE LIST

## 2020-08-16 NOTE — ED Notes (Signed)
[name] no active bleeding noted at this time

## 2020-08-16 NOTE — ED Notes (Signed)
Pt leaving [facility]. Pt A&o x4 at this time. Discharge instructions reviewed. Attempted to call facility multiple times. No nurse available to take report. Receptionist made aware that pt was coming back to facility.

## 2020-08-16 NOTE — ED Provider Notes (Signed)
[facility] EMERGENCY DEPARTMENT Provider Note   CSN: [id number] Arrival date & time: 08/16/20  1729     History Chief Complaint  Patient presents with  . Vascular Access Problem    [name] Michael is a 60 y.o. female hx of ESRD (last HD was today), hypertension, here presenting with bleeding fistula.  Patient was at dialysis and her fistula started bleeding after catheter was removed.  EMS noted that she was hypertensive with a blood pressure 190s.  Patient did finish dialysis.  EMS put a clamp on the fistula.  No meds prior to arrival  The history is provided by the patient.       Past Medical History:  Diagnosis Date  . Ambulates with cane   . Constipation   . Depression   . ESRD (end stage renal disease) ([place])    a. TTS Dialysis.  Marland Kitchen GERD (gastroesophageal reflux disease)   . History of stress test    a. 01/2003 MV: EF 74%, no ischemia/infarct.  . Hyperlipidemia   . Hypertension   . Osteoporosis   . Stroke [facility]) 04-01-11   left frontal subcortical, saw Dr. [name]   . Syncope 11/2019  . TIA (transient ischemic attack) 03-12-11  . Tobacco abuse   . Tricuspid regurgitation    a. 05/2016 Echo: EF 65-70%, Gr2DD, mild MR, nl RV fxn, Triv TR, PASP 71mHg; b. 05/2018 Echo: EF 60-65%, Gr2DD, mild MR/TR, RVSP/PASP 359mg; c. 11/2019 Echo: EF 55-60%, no rwma, mild-mod MR, Sev TR w/ RV dilatation (CTA chest neg for PE).  . Type II diabetes mellitus (HCL[facility]  . Vitamin D deficiency     Patient Active Problem List   Diagnosis Date Noted  . Change in mental status 03/30/2020  . Stroke (cerebrum) ([facility]CT[place]10/12/2019  . Hypoxia   . Acute on chronic right-sided heart failure (HCNorthwest [facility]  . Severe tricuspid regurgitation 12/04/2019  . ESRD (end stage renal disease) ([facility]  . GERD (gastroesophageal reflux disease)   . Hypertension   . Type II diabetes mellitus (HCEl[place]  . Weakness   . Syncope 12/03/2019  . Wrist pain, acute, right   . 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 AMS (altered mental status) 04/28/2019  . Hypoxemia 04/28/2019  . Fall 06/09/2018  . Fall at home, initial encounter 06/09/2018  . Anemia of chronic disease 06/09/2018  . Syncope and collapse 06/09/2018  . CKD (chronic kidney disease) stage 5, GFR less than 15 ml/min (HCC) 03/10/2018  . Acute encephalopathy 03/10/2018  . Hypermagnesemia 03/10/2018  . AKI (acute kidney injury) (HCLindale05/20/2018  . Hypercalcemia 11/09/2016  . Hyponatremia 11/09/2016  . Hypovolemia 11/09/2016  . Accelerated hypertension 11/09/2016  . Acute lower UTI 11/09/2016  . Nephrolithiasis 10/24/2016  . Constipation 10/22/2016  . CAP (community acquired pneumonia) 10/22/2016  . Hydronephrosis of right kidney 10/22/2016  . Metabolic acidosis 020000000. CVA (cerebral vascular accident) (HCFour Bears Village01/04/2017  . HAP (hospital-acquired pneumonia) 07/03/2016  . Sepsis secondary to UTI (HCKiln  . UTI due to extended-spectrum beta lactamase (ESBL) producing Escherichia coli   . Acute pyelonephritis   . Bacteremia due to Escherichia coli   . Colitis, indeterminate   . Uncontrolled type 2 diabetes mellitus with complication (HCColome  . 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 associated with diabetes mellitus due to underlying condition (HCNorway  . Chronic diastolic CHF (congestive heart failure) (HCKelly  . Abdominal pain   . Colitis   .  Hypophosphatemia 06/12/2016  . Hypokalemia 06/11/2016  . Hypocalcemia 06/11/2016  . Acute kidney injury superimposed on chronic kidney disease ([place]) 06/11/2016  . Proliferative diabetic retinopathy ([place]) 04/29/2016  . Closed fracture of left distal radius 01/28/2016  . Poor social situation 03/09/2013  . Depression 03/01/2013  . Abnormal mammogram 12/20/2012  . Retinal detachment 11/17/2012  . 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 ([place]) 123456  . Hyperlipidemia 02/09/2007  . Essential hypertension 02/09/2007  . GERD 02/09/2007    Past  Surgical History:  Procedure Laterality Date  . AV FISTULA PLACEMENT Left 02/21/2019   Procedure: BRACHIOCEPHALIC ARTERIOVENOUS (AV) FISTULA CREATION;  Surgeon: [name], MD;  Location: [facility];  Service: Vascular;  Laterality: Left;  . BASCILIC VEIN TRANSPOSITION Left 04/11/2019   Procedure: SECOND STAGE BASILIC VEIN TRANSPOSITION LEFT ARM;  Surgeon: [name], MD;  Location: [facility];  Service: Vascular;  Laterality: Left;  . IR FLUORO GUIDE CV LINE RIGHT  04/29/2019  . IR US GUIDE VASC ACCESS RIGHT  04/29/2019  . OPEN REDUCTION INTERNAL FIXATION (ORIF) DISTAL RADIAL FRACTURE Left 01/28/2016   Procedure: OPEN REDUCTION INTERNAL FIXATION (ORIF) DISTAL RADIAL FRACTURE;  Surgeon: Iran Planas, MD;  Location: [facility];  Service: Orthopedics;  Laterality: Left;  . [place] CATH N/A 12/05/2019   Procedure: RIGHT HEART CATH;  Surgeon: Nelva Bush, MD;  Location: [facility] CV LAB;  Service: Cardiovascular;  Laterality: N/A;  . TONSILLECTOMY AND ADENOIDECTOMY     age 2     OB History   No obstetric history on file.     Family History  Problem Relation Age of Onset  . Stroke Mother   . Diabetes Mother   . Kidney failure Mother   . Heart failure Mother   . Stroke Father   . Cancer Sister        Breast- 41's    Social History   Tobacco Use  . Smoking status: Current Every Day Smoker    Packs/day: 0.30    Years: 32.00    Pack years: 9.60    Types: Cigarettes    Last attempt to quit: 09/23/2012    Years since quitting: 7.9  . Smokeless tobacco: Never Used  . Tobacco comment: 8 cigarettes/day  Vaping Use  . Vaping Use: Never used  Substance Use Topics  . Alcohol use: No  . Drug use: No    Home Medications Prior to Admission medications   Medication Sig Start Date End Date Taking? Authorizing Provider  Amino Acids-Protein Hydrolys (FEEDING SUPPLEMENT, PRO-STAT SUGAR FREE 64,) LIQD Take 30 mLs by mouth in the  morning. WILD CHERRY FLAVOR    [provider]  aspirin 81 MG chewable tablet Chew 81 mg by mouth daily.    [provider]  atorvastatin (LIPITOR) 40 MG tablet Take 40 mg by mouth at bedtime.     [provider]  cloNIDine (CATAPRES) 0.1 MG tablet Take 0.1 mg by mouth every 8 (eight) hours as needed (eye blood pressure).    [provider]  famotidine (PEPCID) 10 MG tablet Take 10 mg by mouth in the morning.    [provider]  FLUoxetine (PROZAC) 10 MG capsule Take 10 mg by mouth daily.    [provider]  gabapentin (NEURONTIN) 100 MG capsule Take 100 mg by mouth 2 (two) times daily. 05/16/20   [provider]  guaiFENesin (ROBITUSSIN) 100 MG/5ML SOLN  Take 10 mLs by mouth every 4 (four) hours as needed for cough or to loosen phlegm.    [provider]  HUMALOG 100 UNIT/ML injection Inject 12 Units into the skin 3 (three) times daily with meals. 05/18/20   [provider]  loratadine (CLARITIN) 10 MG tablet Take 10 mg by mouth daily.    [provider]  metoprolol tartrate (LOPRESSOR) 100 MG tablet Take 100 mg by mouth 2 (two) times daily.    [provider]  Multiple Vitamins-Minerals (CENTRUM SILVER 50+WOMEN) TABS Take 1 tablet by mouth in the morning.    [provider]  omega-3 acid ethyl esters (LOVAZA) 1 g capsule Take 2 capsules by mouth 2 (two) times daily. 05/28/20   [provider]  omeprazole (PRILOSEC) 40 MG capsule Take 40 mg by mouth daily before breakfast.     [provider]  polyethylene glycol powder (GLYCOLAX/MIRALAX) 17 GM/SCOOP powder Take 17 g by mouth See admin instructions. Mix 17 grams of powder into 8 ounces of fluid, stir, and drink (by mouth) once a day    [provider]  SACCHAROMYCES BOULARDII PO Take 1 capsule by mouth 2 (two) times daily.    [provider]  senna-docusate (SENOKOT-S) 8.6-50 MG tablet Take 1 tablet by mouth  at bedtime as needed for mild constipation. 06/11/18   Regalado, [name], MD  sevelamer carbonate (RENVELA) 800 MG tablet Take 800 mg by mouth 3 (three) times daily with meals.    [provider]  sodium bicarbonate 650 MG tablet Take 1,300 mg by mouth 2 (two) times daily.    [provider]  topiramate (TOPAMAX) 25 MG tablet Take 1 tablet (25 mg total) by mouth at bedtime. 08/01/20 08/01/21  Garvin Fila, MD  Vitamin D, Ergocalciferol, (DRISDOL) 50000 units CAPS capsule Take 1 capsule (50,000 Units total) by mouth every 7 (seven) days. 11/21/16   Ghimire, Henreitta Leber, MD    Allergies    Hydrocodone  Review of Systems   Review of Systems  Skin:       Bleeding   All other systems reviewed and are negative.   Physical Exam Updated Vital Signs BP (!) 170/80   Pulse 75   Temp 97.8 F (36.6 C)   Resp 13   LMP 03/06/2013 (LMP Unknown)   SpO2 98%   Physical Exam Vitals and nursing note reviewed.  Constitutional:      Comments: Chronically ill  HENT:     Head: Normocephalic.     Nose: Nose normal.     Mouth/Throat:     Mouth: Mucous membranes are moist.  Eyes:     Extraocular Movements: Extraocular movements intact.     Pupils: Pupils are equal, round, and reactive to light.  Cardiovascular:     Rate and Rhythm: Normal rate and regular rhythm.     Pulses: Normal pulses.     Heart sounds: Normal heart sounds.  Pulmonary:     Effort: Pulmonary effort is normal.     Breath sounds: Normal breath sounds.  Abdominal:     General: Abdomen is flat.  Musculoskeletal:        General: Normal range of motion.     Cervical back: Normal range of motion.     Comments: Left arm dialysis graft with multiple sticks from dialysis access.  She has 1 pinhole from today's access.  There is no active bleeding right now.  There is good thrill from the catheter  Skin:  General: Skin is warm.     Capillary Refill: Capillary refill takes less than 2 seconds.  Neurological:      General: No focal deficit present.     Mental Status: She is oriented to person, place, and time.  Psychiatric:        Mood and Affect: Mood normal.        Behavior: Behavior normal.     ED Results / Procedures / Treatments   Labs (all labs ordered are listed, but only abnormal results are displayed) Labs Reviewed  I-STAT CHEM 8, ED - Abnormal; Notable for the following components:      Result Value   Potassium 3.2 (*)    Chloride 97 (*)    Creatinine, Ser 1.70 (*)    Glucose, Bld 136 (*)    Calcium, Ion 0.92 (*)    All other components within normal limits  CBC WITH DIFFERENTIAL/PLATELET  BASIC METABOLIC PANEL    EKG None  Radiology No results found.  Procedures Procedures   Medications Ordered in ED Medications  hydrALAZINE (APRESOLINE) injection 10 mg (10 mg Intravenous Given 08/16/20 1755)    ED Course  I have reviewed the triage vital signs and the nursing notes.  Pertinent labs & imaging results that were available during my care of the patient were reviewed by me and considered in my medical decision making (see chart for details).    MDM Rules/Calculators/A&P                         [name] Michael is a 60 y.o. female presenting with bleeding from left arm dialysis catheter.  The bleeding has stopped when she got here.  There is a small pinhole from the access today.  There is no active bleeding right now.  We will check a CBC.  I did wrap the whole area with combat gauze and Kerlix.  8:20 PM CBC at baseline. Cr is baseline, K is 3.5.  No bleeding right now.  We will keep the gauze in place and told her it can be removed during dialysis in 2 days  Final Clinical Impression(s) / ED Diagnoses Final diagnoses:  None    Rx / DC Orders ED Discharge Orders    None       Drenda Freeze, MD 08/16/20 2021

## 2020-08-16 NOTE — ED Triage Notes (Signed)
Pt arrived to ED via EMS from Dialysis. Pt finished dialysis and graft was de accessed when site started bleeding. Pressures clamps applied, but continued with bleeding from site. Upon ems arrival pt with uncontrolled bleeding. Pressured applied en route and bleeding slowed.   On arrival to ED Dr Darl Householder to room, combat gauze applied with dressing. Bleeding controlled

## 2020-08-16 NOTE — ED Notes (Signed)
This RN attempted to call [place] facility. No Nurse available to take report at this time. Will attempt at later time.

## 2020-08-16 NOTE — ED Notes (Signed)
Bleeding is controlled at this time, [name] is warm and + radial pulse

## 2020-08-16 NOTE — Discharge Instructions (Addendum)
Bleeding is stopped right now.  Please keep dressing in place  Your fistula can be used next time during dialysis   See your doctor   Return to ER if you have uncontrolled bleeding, shortness of breath

## 2020-08-20 ENCOUNTER — Other Ambulatory Visit: Payer: Self-pay

## 2020-08-20 ENCOUNTER — Ambulatory Visit (HOSPITAL_COMMUNITY)
Admission: RE | Admit: 2020-08-20 | Discharge: 2020-08-20 | Disposition: A | Discharge status: Home / Self Care | Payer: Medicare Other | Source: Ambulatory Visit | Attending: Internal Medicine | Admitting: Internal Medicine

## 2020-08-20 DIAGNOSIS — M545 Low back pain, unspecified: Secondary | ICD-10-CM

## 2020-08-20 DIAGNOSIS — R140 Abdominal distension (gaseous): Secondary | ICD-10-CM | POA: Diagnosis not present

## 2020-08-20 DIAGNOSIS — I132 Hypertensive heart and chronic kidney disease with heart failure and with stage 5 chronic kidney disease, or end stage renal disease: Secondary | ICD-10-CM | POA: Diagnosis not present

## 2020-08-21 ENCOUNTER — Observation Stay (HOSPITAL_COMMUNITY): Payer: Medicare Other

## 2020-08-21 ENCOUNTER — Inpatient Hospital Stay (HOSPITAL_COMMUNITY)
Admission: EM | Admit: 2020-08-21 | Discharge: 2020-08-23 | DRG: 291 | Disposition: A | Discharge status: Skilled Nursing Facility | Payer: Medicare Other | Source: Skilled Nursing Facility | Attending: Student in an Organized Health Care Education/Training Program | Admitting: Student in an Organized Health Care Education/Training Program

## 2020-08-21 ENCOUNTER — Emergency Department (HOSPITAL_COMMUNITY): Payer: Medicare Other

## 2020-08-21 ENCOUNTER — Encounter (HOSPITAL_COMMUNITY): Payer: Self-pay | Admitting: Emergency Medicine

## 2020-08-21 ENCOUNTER — Other Ambulatory Visit: Payer: Self-pay

## 2020-08-21 DIAGNOSIS — Z841 Family history of disorders of kidney and ureter: Secondary | ICD-10-CM

## 2020-08-21 DIAGNOSIS — R140 Abdominal distension (gaseous): Secondary | ICD-10-CM

## 2020-08-21 DIAGNOSIS — R296 Repeated falls: Secondary | ICD-10-CM | POA: Diagnosis present

## 2020-08-21 DIAGNOSIS — Z833 Family history of diabetes mellitus: Secondary | ICD-10-CM

## 2020-08-21 DIAGNOSIS — F32A Depression, unspecified: Secondary | ICD-10-CM | POA: Diagnosis present

## 2020-08-21 DIAGNOSIS — G9341 Metabolic encephalopathy: Secondary | ICD-10-CM | POA: Diagnosis present

## 2020-08-21 DIAGNOSIS — E1122 Type 2 diabetes mellitus with diabetic chronic kidney disease: Secondary | ICD-10-CM | POA: Diagnosis present

## 2020-08-21 DIAGNOSIS — D631 Anemia in chronic kidney disease: Secondary | ICD-10-CM | POA: Diagnosis present

## 2020-08-21 DIAGNOSIS — M810 Age-related osteoporosis without current pathological fracture: Secondary | ICD-10-CM | POA: Diagnosis present

## 2020-08-21 DIAGNOSIS — N186 End stage renal disease: Secondary | ICD-10-CM | POA: Diagnosis present

## 2020-08-21 DIAGNOSIS — K219 Gastro-esophageal reflux disease without esophagitis: Secondary | ICD-10-CM | POA: Diagnosis present

## 2020-08-21 DIAGNOSIS — R0902 Hypoxemia: Secondary | ICD-10-CM | POA: Diagnosis present

## 2020-08-21 DIAGNOSIS — I10 Essential (primary) hypertension: Secondary | ICD-10-CM | POA: Diagnosis present

## 2020-08-21 DIAGNOSIS — R350 Frequency of micturition: Secondary | ICD-10-CM | POA: Diagnosis present

## 2020-08-21 DIAGNOSIS — Z803 Family history of malignant neoplasm of breast: Secondary | ICD-10-CM

## 2020-08-21 DIAGNOSIS — E1165 Type 2 diabetes mellitus with hyperglycemia: Secondary | ICD-10-CM | POA: Diagnosis present

## 2020-08-21 DIAGNOSIS — R400 Somnolence: Secondary | ICD-10-CM

## 2020-08-21 DIAGNOSIS — Z79899 Other long term (current) drug therapy: Secondary | ICD-10-CM

## 2020-08-21 DIAGNOSIS — E872 Acidosis: Secondary | ICD-10-CM | POA: Diagnosis present

## 2020-08-21 DIAGNOSIS — F17210 Nicotine dependence, cigarettes, uncomplicated: Secondary | ICD-10-CM | POA: Diagnosis present

## 2020-08-21 DIAGNOSIS — K5900 Constipation, unspecified: Secondary | ICD-10-CM | POA: Diagnosis present

## 2020-08-21 DIAGNOSIS — I132 Hypertensive heart and chronic kidney disease with heart failure and with stage 5 chronic kidney disease, or end stage renal disease: Principal | ICD-10-CM | POA: Diagnosis present

## 2020-08-21 DIAGNOSIS — Z8673 Personal history of transient ischemic attack (TIA), and cerebral infarction without residual deficits: Secondary | ICD-10-CM

## 2020-08-21 DIAGNOSIS — Z823 Family history of stroke: Secondary | ICD-10-CM

## 2020-08-21 DIAGNOSIS — E785 Hyperlipidemia, unspecified: Secondary | ICD-10-CM | POA: Diagnosis present

## 2020-08-21 DIAGNOSIS — G2581 Restless legs syndrome: Secondary | ICD-10-CM | POA: Diagnosis present

## 2020-08-21 DIAGNOSIS — Z8616 Personal history of COVID-19: Secondary | ICD-10-CM

## 2020-08-21 DIAGNOSIS — R4182 Altered mental status, unspecified: Secondary | ICD-10-CM | POA: Diagnosis present

## 2020-08-21 DIAGNOSIS — Z20822 Contact with and (suspected) exposure to covid-19: Secondary | ICD-10-CM | POA: Diagnosis present

## 2020-08-21 DIAGNOSIS — Z794 Long term (current) use of insulin: Secondary | ICD-10-CM

## 2020-08-21 DIAGNOSIS — Z7982 Long term (current) use of aspirin: Secondary | ICD-10-CM

## 2020-08-21 DIAGNOSIS — E559 Vitamin D deficiency, unspecified: Secondary | ICD-10-CM | POA: Diagnosis present

## 2020-08-21 DIAGNOSIS — Z992 Dependence on renal dialysis: Secondary | ICD-10-CM

## 2020-08-21 DIAGNOSIS — I5032 Chronic diastolic (congestive) heart failure: Secondary | ICD-10-CM | POA: Diagnosis present

## 2020-08-21 DIAGNOSIS — E1129 Type 2 diabetes mellitus with other diabetic kidney complication: Secondary | ICD-10-CM | POA: Diagnosis present

## 2020-08-21 DIAGNOSIS — Z8249 Family history of ischemic heart disease and other diseases of the circulatory system: Secondary | ICD-10-CM

## 2020-08-21 LAB — I-STAT ARTERIAL BLOOD GAS, ED
Acid-Base Excess: 6 mmol/L — ABNORMAL HIGH (ref 0.0–2.0)
Bicarbonate: 31.5 mmol/L — ABNORMAL HIGH (ref 20.0–28.0)
Calcium, Ion: 1.23 mmol/L (ref 1.15–1.40)
HCT: 33 % — ABNORMAL LOW (ref 36.0–46.0)
Hemoglobin: 11.2 g/dL — ABNORMAL LOW (ref 12.0–15.0)
O2 Saturation: 95 %
Patient temperature: 98.1
Potassium: 4 mmol/L (ref 3.5–5.1)
Sodium: 133 mmol/L — ABNORMAL LOW (ref 135–145)
TCO2: 33 mmol/L — ABNORMAL HIGH (ref 22–32)
pCO2 arterial: 50.3 mmHg — ABNORMAL HIGH (ref 32.0–48.0)
pH, Arterial: 7.404 (ref 7.350–7.450)
pO2, Arterial: 76 mmHg — ABNORMAL LOW (ref 83.0–108.0)

## 2020-08-21 LAB — COMPREHENSIVE METABOLIC PANEL
ALT: 18 U/L (ref 0–44)
AST: 18 U/L (ref 15–41)
Albumin: 3.6 g/dL (ref 3.5–5.0)
Alkaline Phosphatase: 77 U/L (ref 38–126)
Anion gap: 18 — ABNORMAL HIGH (ref 5–15)
BUN: 45 mg/dL — ABNORMAL HIGH (ref 6–20)
CO2: 25 mmol/L (ref 22–32)
Calcium: 9.7 mg/dL (ref 8.9–10.3)
Chloride: 90 mmol/L — ABNORMAL LOW (ref 98–111)
Creatinine, Ser: 4.8 mg/dL — ABNORMAL HIGH (ref 0.44–1.00)
GFR, Estimated: 10 mL/min — ABNORMAL LOW (ref >60)
Glucose, Bld: 234 mg/dL — ABNORMAL HIGH (ref 70–99)
Potassium: 4.1 mmol/L (ref 3.5–5.1)
Sodium: 133 mmol/L — ABNORMAL LOW (ref 135–145)
Total Bilirubin: 0.6 mg/dL (ref 0.3–1.2)
Total Protein: 7.5 g/dL (ref 6.5–8.1)

## 2020-08-21 LAB — URINALYSIS, ROUTINE W REFLEX MICROSCOPIC
Bacteria, UA: NONE SEEN
Bilirubin Urine: NEGATIVE
Glucose, UA: >=500 mg/dL — AB
Hgb urine dipstick: NEGATIVE
Ketones, ur: NEGATIVE mg/dL
Leukocytes,Ua: NEGATIVE
Nitrite: NEGATIVE
Protein, ur: 100 mg/dL — AB
Specific Gravity, Urine: 1.002 — ABNORMAL LOW (ref 1.005–1.030)
pH: 8 (ref 5.0–8.0)

## 2020-08-21 LAB — CBG MONITORING, ED: Glucose-Capillary: 242 mg/dL — ABNORMAL HIGH (ref 70–99)

## 2020-08-21 LAB — CBC WITH DIFFERENTIAL/PLATELET
Abs Immature Granulocytes: 0.02 10*3/uL (ref 0.00–0.07)
Basophils Absolute: 0.1 10*3/uL (ref 0.0–0.1)
Basophils Relative: 1 %
Eosinophils Absolute: 0.2 10*3/uL (ref 0.0–0.5)
Eosinophils Relative: 3 %
HCT: 37.4 % (ref 36.0–46.0)
Hemoglobin: 11.2 g/dL — ABNORMAL LOW (ref 12.0–15.0)
Immature Granulocytes: 0 %
Lymphocytes Relative: 21 %
Lymphs Abs: 1.7 10*3/uL (ref 0.7–4.0)
MCH: 29.1 pg (ref 26.0–34.0)
MCHC: 29.9 g/dL — ABNORMAL LOW (ref 30.0–36.0)
MCV: 97.1 fL (ref 80.0–100.0)
Monocytes Absolute: 0.5 10*3/uL (ref 0.1–1.0)
Monocytes Relative: 6 %
Neutro Abs: 5.6 10*3/uL (ref 1.7–7.7)
Neutrophils Relative %: 69 %
Platelets: 332 10*3/uL (ref 150–400)
RBC: 3.85 MIL/uL — ABNORMAL LOW (ref 3.87–5.11)
RDW: 17.5 % — ABNORMAL HIGH (ref 11.5–15.5)
WBC: 8 10*3/uL (ref 4.0–10.5)
nRBC: 0 % (ref 0.0–0.2)

## 2020-08-21 LAB — LACTIC ACID, PLASMA
Lactic Acid, Venous: 2.6 mmol/L (ref 0.5–1.9)
Lactic Acid, Venous: 2.7 mmol/L (ref 0.5–1.9)

## 2020-08-21 LAB — TROPONIN I (HIGH SENSITIVITY)
Troponin I (High Sensitivity): 16 ng/L (ref <18)
Troponin I (High Sensitivity): 13 ng/L (ref <18)

## 2020-08-21 LAB — RESP PANEL BY RT-PCR (FLU A&B, COVID) ARPGX2
Influenza A by PCR: NEGATIVE
Influenza B by PCR: NEGATIVE
SARS Coronavirus 2 by RT PCR: NEGATIVE

## 2020-08-21 LAB — LIPASE, BLOOD: Lipase: 80 U/L — ABNORMAL HIGH (ref 11–51)

## 2020-08-21 LAB — BETA-HYDROXYBUTYRIC ACID: Beta-Hydroxybutyric Acid: 0.19 mmol/L (ref 0.05–0.27)

## 2020-08-21 LAB — AMMONIA: Ammonia: 32 umol/L (ref 9–35)

## 2020-08-21 LAB — GLUCOSE, CAPILLARY: Glucose-Capillary: 148 mg/dL — ABNORMAL HIGH (ref 70–99)

## 2020-08-21 MED ORDER — SODIUM CHLORIDE 0.9 % IV SOLN: 100.0000 mL | INTRAVENOUS | Status: DC | PRN

## 2020-08-21 MED ORDER — LIDOCAINE-PRILOCAINE 2.5-2.5 % EX CREA
1.0000 "application " | TOPICAL_CREAM | CUTANEOUS | Status: DC | PRN
  Filled 2020-08-21: qty 5

## 2020-08-21 MED ORDER — CHLORHEXIDINE GLUCONATE CLOTH 2 % EX PADS
6.0000 | MEDICATED_PAD | Freq: Every day | CUTANEOUS | Status: DC
  Administered 2020-08-22 – 2020-08-23 (×2): 6 via TOPICAL

## 2020-08-21 MED ORDER — PENTAFLUOROPROP-TETRAFLUOROETH EX AERO
1.0000 "application " | INHALATION_SPRAY | CUTANEOUS | Status: DC | PRN
  Filled 2020-08-21: qty 116

## 2020-08-21 MED ORDER — INSULIN ASPART 100 UNIT/ML ~~LOC~~ SOLN: 0.0000 [IU] | SUBCUTANEOUS | Status: DC

## 2020-08-21 MED ORDER — CLONIDINE HCL 0.1 MG PO TABS
0.1000 mg | ORAL_TABLET | Freq: Once | ORAL | Status: AC
  Administered 2020-08-21: 0.1 mg via ORAL
  Filled 2020-08-21: qty 1

## 2020-08-21 MED ORDER — ALTEPLASE 2 MG IJ SOLR
2.0000 mg | Freq: Once | INTRAMUSCULAR | Status: DC | PRN
  Filled 2020-08-21: qty 2

## 2020-08-21 MED ORDER — LIDOCAINE HCL (PF) 1 % IJ SOLN: 5.0000 mL | INTRAMUSCULAR | Status: DC | PRN

## 2020-08-21 MED ORDER — HEPARIN SODIUM (PORCINE) 1000 UNIT/ML DIALYSIS
1000.0000 [IU] | INTRAMUSCULAR | Status: DC | PRN
  Filled 2020-08-21: qty 1

## 2020-08-21 MED ORDER — HEPARIN SODIUM (PORCINE) 5000 UNIT/ML IJ SOLN
5000.0000 [IU] | Freq: Three times a day (TID) | INTRAMUSCULAR | Status: DC
  Administered 2020-08-22 – 2020-08-23 (×4): 5000 [IU] via SUBCUTANEOUS
  Filled 2020-08-21 (×6): qty 1

## 2020-08-21 NOTE — ED Provider Notes (Signed)
[facility] EMERGENCY DEPARTMENT Provider Note   CSN: [id number] Arrival date & time: 08/21/20  [id number]     History Chief Complaint  Patient presents with  . Fatigue    [name] Michael is a 60 y.o. female.  The history is provided by the patient and medical records. No language interpreter was used.  Illness Location:  Somnolence, fatigue, chills, cough, malaise, diarrhea, nausea, headache Severity:  Moderate Onset quality:  Gradual Timing:  Constant Progression:  Worsening Chronicity:  New Associated symptoms: congestion, cough, diarrhea, fatigue, headaches, nausea, rhinorrhea and shortness of breath   Associated symptoms: no abdominal pain, no chest pain, no fever, no loss of consciousness, no myalgias, no rash, no vomiting and no wheezing        Past Medical History:  Diagnosis Date  . Ambulates with cane   . Constipation   . Depression   . ESRD (end stage renal disease) ([place])    a. TTS Dialysis.  Marland Kitchen GERD (gastroesophageal reflux disease)   . History of stress test    a. 01/2003 MV: EF 74%, no ischemia/infarct.  . Hyperlipidemia   . Hypertension   . Osteoporosis   . Stroke [facility]) 04-01-11   left frontal subcortical, saw Dr. [name]   . Syncope 11/2019  . TIA (transient ischemic attack) 03-12-11  . Tobacco abuse   . Tricuspid regurgitation    a. 05/2016 Echo: EF 65-70%, Gr2DD, mild MR, nl RV fxn, Triv TR, PASP 80mHg; b. 05/2018 Echo: EF 60-65%, Gr2DD, mild MR/TR, RVSP/PASP 384mg; c. 11/2019 Echo: EF 55-60%, no rwma, mild-mod MR, Sev TR w/ RV dilatation (CTA chest neg for PE).  . Type II diabetes mellitus ([facility]  . Vitamin D deficiency     Patient Active Problem List   Diagnosis Date Noted  . Change in mental status 03/30/2020  . Stroke (cerebrum) (HCL[place]ville10/12/2019  . Hypoxia   . Acute on chronic right-sided heart failure ([facility]  . Severe tricuspid regurgitation 12/04/2019  . ESRD (end stage renal disease) (HCCoal[place]  . GERD (gastroesophageal reflux  disease)   . Hypertension   . Type II diabetes mellitus ([facility]  . Weakness   . Syncope 12/03/2019  . Wrist pain, acute, right   . 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 AMS (altered mental status) 04/28/2019  . Hypoxemia 04/28/2019  . Fall 06/09/2018  . Fall at home, initial encounter 06/09/2018  . Anemia of chronic disease 06/09/2018  . Syncope and collapse 06/09/2018  . CKD (chronic kidney disease) stage 5, GFR less than 15 ml/min (HCC) 03/10/2018  . Acute encephalopathy 03/10/2018  . Hypermagnesemia 03/10/2018  . AKI (acute kidney injury) (HCPearl05/20/2018  . Hypercalcemia 11/09/2016  . Hyponatremia 11/09/2016  . Hypovolemia 11/09/2016  . Accelerated hypertension 11/09/2016  . Acute lower UTI 11/09/2016  . Nephrolithiasis 10/24/2016  . Constipation 10/22/2016  . CAP (community acquired pneumonia) 10/22/2016  . Hydronephrosis of right kidney 10/22/2016  . Metabolic acidosis 020000000. CVA (cerebral vascular accident) (HCOketo01/04/2017  . HAP (hospital-acquired pneumonia) 07/03/2016  . Sepsis secondary to UTI (HCMaple City  . UTI due to extended-spectrum beta lactamase (ESBL) producing Escherichia coli   . Acute pyelonephritis   . Bacteremia due to Escherichia coli   . Colitis, indeterminate   . Uncontrolled type 2 diabetes mellitus with complication (HCGil[place]  . 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 associated with diabetes mellitus due to underlying condition (HCGretna  . Chronic diastolic CHF (  congestive heart failure) ([place])   . Abdominal pain   . Colitis   . Hypophosphatemia 06/12/2016  . Hypokalemia 06/11/2016  . Hypocalcemia 06/11/2016  . Acute kidney injury superimposed on chronic kidney disease ([place]) 06/11/2016  . Proliferative diabetic retinopathy ([place]) 04/29/2016  . Closed fracture of left distal radius 01/28/2016  . Poor social situation 03/09/2013  . Depression 03/01/2013  . Abnormal mammogram 12/20/2012  . Retinal detachment 11/17/2012  . Poorly  controlled type II diabetes mellitus with renal complication ([place]) [id number]  . Hyperlipidemia 02/09/2007  . Essential hypertension 02/09/2007  . GERD 02/09/2007    Past Surgical History:  Procedure Laterality Date  . AV FISTULA PLACEMENT Left 02/21/2019   Procedure: BRACHIOCEPHALIC ARTERIOVENOUS (AV) FISTULA CREATION;  Surgeon: [name], MD;  Location: [place];  Service: Vascular;  Laterality: Left;  . BASCILIC VEIN TRANSPOSITION Left 04/11/2019   Procedure: SECOND STAGE BASILIC VEIN TRANSPOSITION LEFT ARM;  Surgeon: [name], MD;  Location: [place];  Service: Vascular;  Laterality: Left;  . IR FLUORO GUIDE CV LINE RIGHT  04/29/2019  . IR US GUIDE VASC ACCESS RIGHT  04/29/2019  . OPEN REDUCTION INTERNAL FIXATION (ORIF) DISTAL RADIAL FRACTURE Left 01/28/2016   Procedure: OPEN REDUCTION INTERNAL FIXATION (ORIF) DISTAL RADIAL FRACTURE;  Surgeon: Iran Planas, MD;  Location: [place];  Service: Orthopedics;  Laterality: Left;  . [place] CATH N/A 12/05/2019   Procedure: RIGHT HEART CATH;  Surgeon: Nelva Bush, MD;  Location: [place] CV LAB;  Service: Cardiovascular;  Laterality: N/A;  . TONSILLECTOMY AND ADENOIDECTOMY     age 3     OB History   No obstetric history on file.     Family History  Problem Relation Age of Onset  . Stroke Mother   . Diabetes Mother   . Kidney failure Mother   . Heart failure Mother   . Stroke Father   . Cancer Sister        Breast- 37's    Social History   Tobacco Use  . Smoking status: Current Every Day Smoker    Packs/day: 0.30    Years: 32.00    Pack years: 9.60    Types: Cigarettes    Last attempt to quit: 09/23/2012    Years since quitting: 7.9  . Smokeless tobacco: Never Used  . Tobacco comment: 8 cigarettes/day  Vaping Use  . Vaping Use: Never used  Substance Use Topics  . Alcohol use: No  . Drug use: No    Home Medications Prior to Admission medications    Medication Sig Start Date End Date Taking? Authorizing Provider  Amino Acids-Protein Hydrolys (FEEDING SUPPLEMENT, PRO-STAT SUGAR FREE 64,) LIQD Take 30 mLs by mouth in the morning. WILD CHERRY FLAVOR    [provider]  aspirin 81 MG chewable tablet Chew 81 mg by mouth daily.    [provider]  atorvastatin (LIPITOR) 40 MG tablet Take 40 mg by mouth at bedtime.     [provider]  cloNIDine (CATAPRES) 0.1 MG tablet Take 0.1 mg by mouth every 8 (eight) hours as needed (eye blood pressure).    [provider]  famotidine (PEPCID) 10 MG tablet Take 10 mg by mouth in the morning.    [provider]  FLUoxetine (PROZAC) 10 MG capsule Take 10 mg by mouth daily.    [provider]  gabapentin (NEURONTIN) 100 MG capsule Take 100 mg by mouth  2 (two) times daily. 05/16/20   [provider]  guaiFENesin (ROBITUSSIN) 100 MG/5ML SOLN Take 10 mLs by mouth every 4 (four) hours as needed for cough or to loosen phlegm.    [provider]  HUMALOG 100 UNIT/ML injection Inject 12 Units into the skin 3 (three) times daily with meals. 05/18/20   [provider]  loratadine (CLARITIN) 10 MG tablet Take 10 mg by mouth daily.    [provider]  metoprolol tartrate (LOPRESSOR) 100 MG tablet Take 100 mg by mouth 2 (two) times daily.    [provider]  Multiple Vitamins-Minerals (CENTRUM SILVER 50+WOMEN) TABS Take 1 tablet by mouth in the morning.    [provider]  omega-3 acid ethyl esters (LOVAZA) 1 g capsule Take 2 capsules by mouth 2 (two) times daily. 05/28/20   [provider]  omeprazole (PRILOSEC) 40 MG capsule Take 40 mg by mouth daily before breakfast.     [provider]  polyethylene glycol powder (GLYCOLAX/MIRALAX) 17 GM/SCOOP powder Take 17 g by mouth See admin instructions. Mix 17 grams of powder into 8 ounces of fluid, stir, and drink (by mouth) once a day    [provider]  SACCHAROMYCES BOULARDII PO Take 1 capsule by mouth 2 (two) times daily.    [provider]  senna-docusate (SENOKOT-S) 8.6-50 MG tablet Take 1 tablet by mouth at bedtime as needed for mild constipation. 06/11/18   Regalado, [name], MD  sevelamer carbonate (RENVELA) 800 MG tablet Take 800 mg by mouth 3 (three) times daily with meals.    [provider]  sodium bicarbonate 650 MG tablet Take 1,300 mg by mouth 2 (two) times daily.    [provider]  topiramate (TOPAMAX) 25 MG tablet Take 1 tablet (25 mg total) by mouth at bedtime. 08/01/20 08/01/21  Garvin Fila, MD  Vitamin D, Ergocalciferol, (DRISDOL) 50000 units CAPS capsule Take 1 capsule (50,000 Units total) by mouth every 7 (seven) days. 11/21/16   Ghimire, Henreitta Leber, MD    Allergies    Hydrocodone  Review of Systems   Review of Systems  Constitutional: Positive for chills and fatigue. Negative for diaphoresis and fever.  HENT: Positive for congestion and rhinorrhea.   Eyes: Negative for visual disturbance.  Respiratory: Positive for cough and shortness of breath. Negative for chest tightness, wheezing and stridor.   Cardiovascular: Negative for chest pain, palpitations and leg swelling.  Gastrointestinal: Positive for diarrhea and nausea. Negative for abdominal pain, constipation and vomiting.  Genitourinary: Positive for dysuria. Negative for flank pain and frequency.  Musculoskeletal: Negative for back pain, myalgias, neck pain and neck stiffness.  Skin: Negative for rash and wound.  Neurological: Positive for light-headedness and headaches. Negative for dizziness, loss of consciousness, syncope and speech difficulty.  Psychiatric/Behavioral: Negative for agitation and confusion.  All other systems reviewed and are negative.   Physical Exam Updated Vital Signs LMP 03/06/2013 (LMP Unknown)   Physical Exam Vitals and nursing note reviewed.  Constitutional:      General: She is not in acute  distress.    Appearance: She is well-developed and well-nourished. She is not ill-appearing, toxic-appearing or diaphoretic.  HENT:     Head: Normocephalic and atraumatic.     Nose: No congestion or rhinorrhea.     Mouth/Throat:     Mouth: Mucous membranes are moist.     Pharynx: No oropharyngeal exudate or posterior oropharyngeal erythema.  Eyes:     Extraocular Movements: Extraocular  movements intact.     Conjunctiva/sclera: Conjunctivae normal.     Pupils: Pupils are equal, round, and reactive to light.  Cardiovascular:     Rate and Rhythm: Normal rate and regular rhythm.     Heart sounds: Murmur heard.    Pulmonary:     Effort: Pulmonary effort is normal. No respiratory distress.     Breath sounds: Rhonchi and rales present. No wheezing.  Chest:     Chest wall: No tenderness.  Abdominal:     General: Abdomen is flat.     Palpations: Abdomen is soft.     Tenderness: There is no abdominal tenderness. There is no right CVA tenderness, left CVA tenderness, guarding or rebound.  Musculoskeletal:        General: No tenderness or edema.     Cervical back: Neck supple. No tenderness.     Right lower leg: No edema.     Left lower leg: No edema.  Skin:    General: Skin is warm and dry.     Capillary Refill: Capillary refill takes less than 2 seconds.     Findings: No erythema.  Neurological:     General: No focal deficit present.     GCS: GCS eye subscore is 3. GCS verbal subscore is 5. GCS motor subscore is 6.     Cranial Nerves: No dysarthria or facial asymmetry.     Sensory: No sensory deficit.     Motor: No weakness or abnormal muscle tone.     Comments: Patient is somnolent but rousable to voice.  Psychiatric:        Mood and Affect: Mood and affect and mood normal.     ED Results / Procedures / Treatments   Labs (all labs ordered are listed, but only abnormal results are displayed) Labs Reviewed  CBC WITH DIFFERENTIAL/PLATELET - Abnormal; Notable for the following  components:      Result Value   RBC 3.85 (*)    Hemoglobin 11.2 (*)    MCHC 29.9 (*)    RDW 17.5 (*)    All other components within normal limits  COMPREHENSIVE METABOLIC PANEL - Abnormal; Notable for the following components:   Sodium 133 (*)    Chloride 90 (*)    Glucose, Bld 234 (*)    BUN 45 (*)    Creatinine, Ser 4.80 (*)    GFR, Estimated 10 (*)    Anion gap 18 (*)    All other components within normal limits  LIPASE, BLOOD - Abnormal; Notable for the following components:   Lipase 80 (*)    All other components within normal limits  LACTIC ACID, PLASMA - Abnormal; Notable for the following components:   Lactic Acid, Venous 2.6 (*)    All other components within normal limits  LACTIC ACID, PLASMA - Abnormal; Notable for the following components:   Lactic Acid, Venous 2.7 (*)    All other components within normal limits  URINALYSIS, ROUTINE W REFLEX MICROSCOPIC - Abnormal; Notable for the following components:   Color, Urine STRAW (*)    Specific Gravity, Urine 1.002 (*)    Glucose, UA >=500 (*)    Protein, ur 100 (*)    All other components within normal limits  CBG MONITORING, ED - Abnormal; Notable for the following components:   Glucose-Capillary 242 (*)    All other components within normal limits  I-STAT ARTERIAL BLOOD GAS, ED - Abnormal; Notable for the following components:   pCO2 arterial 50.3 (*)  pO2, Arterial 76 (*)    Bicarbonate 31.5 (*)    TCO2 33 (*)    Acid-Base Excess 6.0 (*)    Sodium 133 (*)    HCT 33.0 (*)    Hemoglobin 11.2 (*)    All other components within normal limits  URINE CULTURE  RESP PANEL BY RT-PCR (FLU A&B, COVID) ARPGX2  BETA-HYDROXYBUTYRIC ACID  AMMONIA  BLOOD GAS, ARTERIAL  TROPONIN I (HIGH SENSITIVITY)  TROPONIN I (HIGH SENSITIVITY)    EKG EKG Interpretation  Date/Time:  Tuesday August 21 2020 09:17:10 EST Ventricular Rate:  70 PR Interval:    QRS Duration: 152 QT Interval:  494 QTC Calculation: 534 R  Axis:   105 Text Interpretation: Sinus rhythm RBBB and LPFB Borderline ST depression, lateral leads When compared to prior, similar apperance. No STEMI Confirmed by Antony Blackbird [phone]) on 08/21/2020 9:27:51 AM   Radiology CT Head Wo Contrast  Result Date: 08/21/2020 CLINICAL DATA:  Head trauma, abnormal mental status. Fall, headache, somnolence. Additional history provided: Multiple recent falls, occipital headache. EXAM: CT HEAD WITHOUT CONTRAST TECHNIQUE: Contiguous axial images were obtained from the base of the skull through the vertex without intravenous contrast. COMPARISON:  Prior head CT examinations 06/28/2020 and earlier. Brain MRI/MRA 03/29/2020. FINDINGS: Brain: Mild cerebral and cerebellar atrophy. Redemonstrated known chronic lacunar infarcts within the bilateral basal ganglia, bilateral thalami and left internal capsule. Background moderate ill-defined hypoattenuation within the cerebral white matter is nonspecific, but compatible with chronic small vessel ischemic disease. There is no acute intracranial hemorrhage. No demarcated cortical infarct. No extra-axial fluid collection. No evidence of intracranial mass. No midline shift. Vascular: No hyperdense vessel.  Atherosclerotic calcifications. Skull: Normal. Negative for fracture or focal lesion. Sinuses/Orbits: Visualized orbits show no acute finding. Tiny left maxillary sinus mucous retention cyst. Frothy secretions within the right sphenoid sinus. IMPRESSION: No evidence of acute intracranial abnormality. Known chronic lacunar infarcts within the bilateral basal ganglia, bilateral thalami and left internal capsule. Stable background mild generalized parenchymal atrophy and moderate chronic small vessel ischemic disease. Right sphenoid sinusitis. Electronically Signed   By: Kellie Simmering DO   On: 08/21/2020 11:17   CT THORACIC SPINE WO CONTRAST  Result Date: 08/20/2020 CLINICAL DATA:  Back pain status post fall. Paresthesias, possibly  related to underlying renal disease. EXAM: CT THORACIC SPINE WITHOUT CONTRAST TECHNIQUE: Multidetector CT images of the thoracic were obtained using the standard protocol without intravenous contrast. COMPARISON:  Thoracic spine radiographs 02/29/2020 and chest radiographs 06/28/2020. Chest CTA 12/02/2019. FINDINGS: Alignment: Normal. Vertebrae: No evidence of acute thoracic spine fracture or traumatic subluxation. There is a healing subacute fracture of the upper sternum, not present on prior chest CT. No rib fractures are seen. Paraspinal and other soft tissues: The paraspinal soft tissues appear normal. There is atherosclerosis of the aorta, great vessels and coronary arteries. Nonspecific perinephric soft tissue stranding bilaterally appears similar to previous abdominal CT from 04/28/2019. Disc levels: The thoracic disc heights are maintained. No large disc herniation, spinal stenosis or foraminal compromise identified. IMPRESSION: 1. No evidence of acute thoracic spine fracture, traumatic subluxation or static signs of instability. 2. Healing subacute fracture of the upper sternum. 3. Aortic Atherosclerosis (ICD10-I70.0). Electronically Signed   By: Richardean Sale M.D.   On: 08/20/2020 12:43   DG Chest Portable 1 View  Result Date: 08/21/2020 CLINICAL DATA:  Chills and cough.  Shortness of breath. EXAM: PORTABLE CHEST 1 VIEW COMPARISON:  One-view chest x-ray 06/28/2020 FINDINGS: The heart is enlarged. Atherosclerotic calcifications are  present at the aortic arch. No edema or effusion is present. No focal airspace disease is present. IMPRESSION: Cardiomegaly without failure. Electronically Signed   By: San Morelle M.D.   On: 08/21/2020 10:40    Procedures Procedures   Medications Ordered in ED Medications  cloNIDine (CATAPRES) tablet 0.1 mg (has no administration in time range)    ED Course  I have reviewed the triage vital signs and the nursing notes.  Pertinent labs & imaging  results that were available during my care of the patient were reviewed by me and considered in my medical decision making (see chart for details).    MDM Rules/Calculators/A&P                          ROSYLN RAPOZA is a 60 y.o. female with a past medical history significant for ESRD on dialysis TTS, diabetes, hypertension, hyperlipidemia, prior encephalopathy, CHF, prior stroke, prior UTI, coronavirus last month, osteoporosis, GERD, and depression who presents today with chills, cough, short of breath, somnolence/altered mental status, fatigue, dysuria, nausea, and headache.  Patient reports that several days ago she had a fall where she hit her head.  She reportedly had a CT of her thoracic spine when she had back pain but did not show any acute fractures.  She says she did not have any head imaging at that time.  She says that for the last few days she has been feeling worse.  She says that last night, her glucose was found to be in the 50s and she was feeling very fatigued.  They gave her food  overnight and today her glucose was in the 300s.  Now, patient was feeling continued headache, was very sleepy and somnolent, and was also complaining of a dry cough, nausea, diarrhea, and dysuria.  Patient denies any new leg pain or leg swelling.  She denies any current chest pain.  Denies abdominal pain.  Reports the urine she does make has been burning recently and chart review shows she has had UTI, sepsis, pyelonephritis in the past.  Patient reports moderate to severe headache at this time but denies any neck pain or neck stiffness.  She was not given insulin this morning when she was hypoglycemic overnight.  She recently had COVID per the chart.  On exam, patient's lungs do have some rales and rhonchi.  There was no wheezing appreciated.  She does have a slight murmur.  Abdomen and chest nontender on my exam.  No focal neurologic deficits this patient is moving all extremities with normal sensation and  strength.  Clear speech.  Pupil symmetric and reactive normal extraocular movements.  She is somnolent but arousable to voice.  She denies any leg tenderness or leg swelling.  Patient was hypertensive otherwise was not tachycardic or febrile on arrival.  Clinically I do feel need to get some imaging to rule out traumatic imaging of the head leading to altered mental status.  We will get a CT head.  We will get chest x-ray and labs to look for other causes of her somnolence and glucose abnormalities.  With her dysuria will check urinalysis.  With her cough will check chest x-ray.  Given her lack of any chest pain, hypoxia, or tachycardia, lower suspicion for a thromboembolic cause at this time.  Anticipate reassessment after work-up and depending on what we discover, patient may need dialysis as she missed today due to feeling ill.   2:33 PM Patient's  work-up again to return.  Her lactic acid was elevated and slightly rising but troponin was negative x2.  Urinalysis does not show infection with no nitrites, leukocytes, or bacteria.  Her ABG does not show acidosis and her beta hydroxybutyric acid was normal.  She has no leukocytosis.  She now denies significant headache or neck stiffness, doubt meningitis at this time.  Creatinine is elevated as expected and her BUN is slightly elevated.  Ammonia not elevated as cause of altered mental status but her lipase is elevated.  On reassessment she does not have any abdominal tenderness or pain.  She is still feeling very sleepy.  CT head did not show acute abnormality but does show some old stroke areas.  Chest x-ray shows cardiomegaly without new heart failure.  Clinically I am still concerned about the patient's fatigue and altered mental status and now blood pressures are more elevated and remained in the 190s to 200 range.  Nephrology came to see the patient and feels that many of her symptoms may improve with dialysis.  They will try to get her dialysis  today and if she improves, she would likely be stable for discharge home but if she does not, she would likely admission.  Anticipate her going to dialysis and then returning for reassessment prior to disposition determination.  Spoke to the nephrology team and they now feel she is appropriate for admission to medicine and they will likely dialyze in the morning. Lactic acid is up trending. We will continue to trend. Patient had Covid last month so initially Covid test was not ordered but it appears to been ordered.   Care transferred to oncoming team while waiting for admission or dialysis.     Final Clinical Impression(s) / ED Diagnoses Final diagnoses:  Hypoxia  Somnolence     Clinical Impression: 1. Hypoxia   2. Somnolence     Disposition: Admit  This note was prepared with assistance of Dragon voice recognition software. Occasional wrong-word or sound-a-like substitutions may have occurred due to the inherent limitations of voice recognition software.     Summerlyn Fickel, Gwenyth Allegra, MD 08/21/20 [phone]

## 2020-08-21 NOTE — Progress Notes (Addendum)
[name] Michael Progress Note   Subjective:     Patient seen and examined today at bedside. Patient scheduled to receive HD tonight. Pt with HTN, DM, CVA and ESRD.  Resides at [facility] know what baseline MS is.  Was brought to ER from [facility] due to dec MS associated with low sugar.  Sugar corrected but MS remains low.  Was due for HD today but labs did not indicate immediate need for HD.  Plan has bee put in place to do HD tonight and reassess MS after for admission-  Now is put in as obs status   Objective Vitals:   08/21/20 1345 08/21/20 1430 08/21/20 1545 08/21/20 1606  BP: (!) 189/81 (!) 165/59 (!) 181/73 (!) 193/77  Pulse: 70 74 72   Resp: '13 13 12   '$ Temp:      TempSrc:      SpO2: 100% 100% 100%   Weight:      Height:       Physical Exam General: No evidence of acute respiratory distress-  Eyes open but not responsive to questions by me Heart: Normal S1 and S2; No murmurs, gallops, or friction rub Lungs: Diminished bilateral bases w/o wheezing, rales, or rhonchi Abdomen: Soft, round, non-tender Extremities: No edema bilateral lower extremities Dialysis Access: L AVF (+) bruit and thrill  Filed Weights   08/21/20 0914  Weight: 43.1 kg   No intake or output data in the 24 hours ending 08/21/20 1634  Additional Objective Labs: Basic Metabolic Panel: Recent Labs  Lab 08/16/20 1821 08/16/20 1837 08/21/20 0939 08/21/20 1009  NA 140 140 133* 133*  K 3.5 3.2* 4.1 4.0  CL 97* 97* 90*  --   CO2 31  --  25  --   GLUCOSE 129* 136* 234*  --   BUN 8 8 45*  --   CREATININE 1.84* 1.70* 4.80*  --   CALCIUM 8.9  --  9.7  --    Liver Function Tests: Recent Labs  Lab 08/21/20 0939  AST 18  ALT 18  ALKPHOS 77  BILITOT 0.6  PROT 7.5  ALBUMIN 3.6   Recent Labs  Lab 08/21/20 0939  LIPASE 80*   CBC: Recent Labs  Lab 08/16/20 1821 08/16/20 1837 08/21/20 0939 08/21/20 1009  WBC 4.5  --  8.0  --   NEUTROABS 2.6  --  5.6  --   HGB 12.0  13.6 11.2* 11.2*  HCT 39.5 40.0 37.4 33.0*  MCV 95.9  --  97.1  --   PLT 168  --  332  --    Blood Culture    Component Value Date/Time   SDES BLOOD RIGHT ANTECUBITAL 12/02/2019 1602   SPECREQUEST  12/02/2019 1602    BOTTLES DRAWN AEROBIC AND ANAEROBIC Blood Culture adequate volume   CULT  12/02/2019 1602    NO GROWTH 5 DAYS Performed at [facility], [address], [place] [address]    REPTSTATUS 12/07/2019 FINAL 12/02/2019 1602    Cardiac Enzymes: No results for input(s): CKTOTAL, CKMB, CKMBINDEX, TROPONINI in the last 168 hours. CBG: Recent Labs  Lab 08/21/20 0906  GLUCAP 242*   Iron Studies: No results for input(s): IRON, TIBC, TRANSFERRIN, FERRITIN in the last 72 hours. Lab Results  Component Value Date   INR 1.0 03/29/2020   INR 1.4 (H) 04/28/2019   INR 1.01 11/09/2016   Studies/Results: CT Head Wo Contrast  Result Date: 08/21/2020 CLINICAL DATA:  Head  trauma, abnormal mental status. Fall, headache, somnolence. Additional history provided: Multiple recent falls, occipital headache. EXAM: CT HEAD WITHOUT CONTRAST TECHNIQUE: Contiguous axial images were obtained from the base of the skull through the vertex without intravenous contrast. COMPARISON:  Prior head CT examinations 06/28/2020 and earlier. Brain MRI/MRA 03/29/2020. FINDINGS: Brain: Mild cerebral and cerebellar atrophy. Redemonstrated known chronic lacunar infarcts within the bilateral basal ganglia, bilateral thalami and left internal capsule. Background moderate ill-defined hypoattenuation within the cerebral white matter is nonspecific, but compatible with chronic small vessel ischemic disease. There is no acute intracranial hemorrhage. No demarcated cortical infarct. No extra-axial fluid collection. No evidence of intracranial mass. No midline shift. Vascular: No hyperdense vessel.  Atherosclerotic calcifications. Skull: Normal. Negative for fracture or focal lesion. Sinuses/Orbits: Visualized  orbits show no acute finding. Tiny left maxillary sinus mucous retention cyst. Frothy secretions within the right sphenoid sinus. IMPRESSION: No evidence of acute intracranial abnormality. Known chronic lacunar infarcts within the bilateral basal ganglia, bilateral thalami and left internal capsule. Stable background mild generalized parenchymal atrophy and moderate chronic small vessel ischemic disease. Right sphenoid sinusitis. Electronically Signed   By: Andris Brothers Simmering DO   On: 08/21/2020 11:17   CT THORACIC SPINE WO CONTRAST  Result Date: 08/20/2020 CLINICAL DATA:  Back pain status post fall. Paresthesias, possibly related to underlying renal disease. EXAM: CT THORACIC SPINE WITHOUT CONTRAST TECHNIQUE: Multidetector CT images of the thoracic were obtained using the standard protocol without intravenous contrast. COMPARISON:  Thoracic spine radiographs 02/29/2020 and chest radiographs 06/28/2020. Chest CTA 12/02/2019. FINDINGS: Alignment: Normal. Vertebrae: No evidence of acute thoracic spine fracture or traumatic subluxation. There is a healing subacute fracture of the upper sternum, not present on prior chest CT. No rib fractures are seen. Paraspinal and other soft tissues: The paraspinal soft tissues appear normal. There is atherosclerosis of the aorta, great vessels and coronary arteries. Nonspecific perinephric soft tissue stranding bilaterally appears similar to previous abdominal CT from 04/28/2019. Disc levels: The thoracic disc heights are maintained. No large disc herniation, spinal stenosis or foraminal compromise identified. IMPRESSION: 1. No evidence of acute thoracic spine fracture, traumatic subluxation or static signs of instability. 2. Healing subacute fracture of the upper sternum. 3. Aortic Atherosclerosis (ICD10-I70.0). Electronically Signed   By: Richardean Sale M.D.   On: 08/20/2020 12:43   DG Chest Portable 1 View  Result Date: 08/21/2020 CLINICAL DATA:  Chills and cough.  Shortness of  breath. EXAM: PORTABLE CHEST 1 VIEW COMPARISON:  One-view chest x-ray 06/28/2020 FINDINGS: The heart is enlarged. Atherosclerotic calcifications are present at the aortic arch. No edema or effusion is present. No focal airspace disease is present. IMPRESSION: Cardiomegaly without failure. Electronically Signed   By: San Morelle M.D.   On: 08/21/2020 10:40    Medications: . sodium chloride    . sodium chloride     . [START ON 08/22/2020] Chlorhexidine Gluconate Cloth  6 each Topical Q0600    Dialysis Orders:  TTS-[facility] 3hrs/51mn BFR 350 DFR 600 EDW 44.5kg 2K/2.25 Ca L AVF  Assessment/Plan: 1. Altered Mental Status: Etiology unclear. CT head (-) acute intracranial abnormality. CXR showed cardiomegaly w/o failure. Ammonia 32. Patient on 3L 02 via [place]. Patient continues to be monitored in ER. Scheduled for HD tonight. 2. ESRD - On HD on TTS. Scheduled for HD tonight. 3. Anemia of CKD- Hgb now 11.2 4. HTN/volume - Noted elevated BP trend. Clonidine 0.'1mg'$  X 1 given. Continue to monitor BP trend 5. Dispo: Patient scheduled for HD  tonight. ED to re-assess patient after tonight's treatment. Depending on mentation and hypoxia will determine whether patient will be officially admitted. Will continue follow-up if patient ends up being admitted.  Tobie Poet, NP [facility] 08/21/2020,4:34 PM   Patient seen and examined, agree with above note with above modifications. See introductory paragraph above-  Pt is not really talking to me today -  Unclear what her baseline is.  Will plan for HD tonight here at hospital then looks like to be admitted to observation for this MS issue-  BP should come down with HD and volume removal  Corliss Parish, MD 08/21/2020

## 2020-08-21 NOTE — Hospital Course (Addendum)
Acute Metabolic Encephalopathy  ESRD on HD TTS  [name] Michael is a 60 y.o. woman with a past medical history of end-stage renal disease on hemodialysis since November 2020, stroke, diabetes, and hypertension who was admitted for somnolence. On chart review, patient had two similar events in the last year, which improved with supportive care. Held home centrally acting medications, including gabapentin and topamax, during stay. Prior dialysis session was three days prior to arrival. Nephrology was consulted due to history of ESRD and multiple metabolic abnormalities noted on labs. Due to abrupt onset altered mental status and history of ischemic events, CT Head was obtained and did not show any acute abnormalities but did show chronic lacunar infarcts in the bilateral basal ganglia, bilateral thalami, and left internal capsule. As per nephrology, patient was hemodialyzed during admission with significant improvement in mental status, suggesting acute metabolic encephalopathy as likely etiology. Will discontinue gabapentin and topamax until she follows-up with PCP (Dr. Margarita Rana, MD). Plan for discharge with transportation being provided today to her outpatient hemodialysis center. PT evaluated for strength and mobility and recommended SNF placement with 24 hr supervision/assistance and outpatient PT services.    Dizziness and Involuntary Movements  Following admission, patient had newfound dizziness and involuntary movements of the bilateral lower extremities. CT head and MRI brain were negative ruling out acute large posterior circulation event. Involuntary movement possibly due to restless leg syndrome. Ferritin was elevated, ruling out iron deficiency as etiology. Will follow-up outpatient with her neurologist Dr. [name] in one month. Will hold topamax until seen by Dr. [name].    Physical Exam:  General: Alert; awake; conversational; appears mildly uncomfortable  Eyes: EOM intact, PERRL; no nystagmus   Cardiovascular: Regular rate, rhythm; normal S1, S2; holosystolic murmur at left sternal border  Lungs: Clear to auscultation bilaterally; no rales, ronchi, or wheezing  Neuro: Alert; CN II, III, IV, VI, VII, X intact; no nystagmus, normal finger-to-nose

## 2020-08-21 NOTE — H&P (Addendum)
Date: 08/21/2020               Patient Name:  [name] Michael MRN: [id number]  DOB: 12-22-1960 Age / Sex: 60 y.o., female   PCP: Charlott Rakes, MD              Medical Service: Internal Medicine Teaching Service              Attending Physician: Dr. Evette Doffing, Mallie Mussel, *    First Contact: Gwynneth Albright, MS3 Pager: [phone]  Second Contact: Dr. Allyson Sabal Pager: [phone]  Third Contact Dr. Court Joy Pager: [phone]       After Hours (After 5p/  First Contact Pager: [phone]  weekends / holidays): Second Contact Pager: [phone]   Chief Complaint: Feeling "sleepy"    History of Present Illness:   [name] Michael is a 60 y.o. women with a past medical history of ESRD on dialysis, stroke, hypertension, type 2 diabetes, encephalopathy, and CHF who presents with confusion and feeling "sleepy". Minimally verbal on exam, requires constant arousal. Patient states that symptoms began this morning and that she was unable to receive her scheduled dialysis today, but received her last session this past Saturday. Notes urinary frequency and discomfort with urination. Endorses chills, lethargy, diarrhea. No vomiting, nausea.    Per [facility] (nursing facility): Patient is fairly independent and able to perform activities of daily living. She had a fall on the 27th. Last night, her CBG was 53, improved with instaglucose and feeding. Remained brittle overnight. Overnight staff noted BG would stabilize then drop significantly. Humalog was held today, which is normal given scheduled hemodialysis. This morning, patient was lethargic with garbled speech. Staff manages her medications but cannot confirm a lack of access to OTC medications. No history of substance use noted.     Meds:  No current facility-administered medications on file prior to encounter.   Current Outpatient Medications on File Prior to Encounter  Medication Sig Dispense Refill   Amino Acids-Protein Hydrolys (FEEDING SUPPLEMENT,  PRO-STAT SUGAR FREE 64,) LIQD Take 30 mLs by mouth in the morning. WILD CHERRY FLAVOR     aspirin 81 MG chewable tablet Chew 81 mg by mouth daily.     atorvastatin (LIPITOR) 40 MG tablet Take 40 mg by mouth at bedtime.      cloNIDine (CATAPRES) 0.1 MG tablet Take 0.1 mg by mouth every 8 (eight) hours as needed (eye blood pressure).     famotidine (PEPCID) 10 MG tablet Take 10 mg by mouth in the morning.     FLUoxetine (PROZAC) 10 MG capsule Take 10 mg by mouth daily.     gabapentin (NEURONTIN) 100 MG capsule Take 100 mg by mouth 2 (two) times daily.     guaiFENesin (ROBITUSSIN) 100 MG/5ML SOLN Take 10 mLs by mouth every 4 (four) hours as needed for cough or to loosen phlegm.     HUMALOG 100 UNIT/ML injection Inject 12 Units into the skin 3 (three) times daily with meals.     loratadine (CLARITIN) 10 MG tablet Take 10 mg by mouth daily.     metoprolol tartrate (LOPRESSOR) 100 MG tablet Take 100 mg by mouth 2 (two) times daily.     Multiple Vitamins-Minerals (CENTRUM SILVER 50+WOMEN) TABS Take 1 tablet by mouth in the morning.     omega-3 acid ethyl esters (LOVAZA) 1 g capsule Take 2 capsules by mouth 2 (two) times daily.     omeprazole (PRILOSEC) 40 MG capsule Take 40 mg by mouth daily  before breakfast.      polyethylene glycol powder (GLYCOLAX/MIRALAX) 17 GM/SCOOP powder Take 17 g by mouth See admin instructions. Mix 17 grams of powder into 8 ounces of fluid, stir, and drink (by mouth) once a day     SACCHAROMYCES BOULARDII PO Take 1 capsule by mouth 2 (two) times daily.     senna-docusate (SENOKOT-S) 8.6-50 MG tablet Take 1 tablet by mouth at bedtime as needed for mild constipation. 30 tablet 0   sevelamer carbonate (RENVELA) 800 MG tablet Take 800 mg by mouth 3 (three) times daily with meals.     sodium bicarbonate 650 MG tablet Take 1,300 mg by mouth 2 (two) times daily.     topiramate (TOPAMAX) 25 MG tablet Take 1 tablet (25 mg total) by mouth at bedtime. 30 tablet 2   Vitamin D,  Ergocalciferol, (DRISDOL) 50000 units CAPS capsule Take 1 capsule (50,000 Units total) by mouth every 7 (seven) days. 30 capsule 0     Allergies: Allergies as of 08/21/2020 - Review Complete 08/21/2020  Allergen Reaction Noted   Hydrocodone Nausea And Vomiting and Other (See Comments) 03/18/2011   Past Medical History:  Diagnosis Date   Ambulates with cane    Constipation    Depression    ESRD (end stage renal disease) (HCC)    a. TTS Dialysis.   GERD (gastroesophageal reflux disease)    History of stress test    a. 01/2003 MV: EF 74%, no ischemia/infarct.   Hyperlipidemia    Hypertension    Osteoporosis    Stroke [facility]) 04-01-11   left frontal subcortical, saw Dr. [name]    Syncope 11/2019   TIA (transient ischemic attack) 03-12-11   Tobacco abuse    Tricuspid regurgitation    a. 05/2016 Echo: EF 65-70%, Gr2DD, mild MR, nl RV fxn, Triv TR, PASP 26mHg; b. 05/2018 Echo: EF 60-65%, Gr2DD, mild MR/TR, RVSP/PASP 361mg; c. 11/2019 Echo: EF 55-60%, no rwma, mild-mod MR, Sev TR w/ RV dilatation (CTA chest neg for PE).   Type II diabetes mellitus ([facility]   Vitamin D deficiency    Past Surgical History:  Procedure Laterality Date   AV FISTULA PLACEMENT Left 02/21/2019   Procedure: BRACHIOCEPHALIC ARTERIOVENOUS (AV) FISTULA CREATION;  Surgeon: ClMarty HeckMD;  [facility] Service: Vascular;  Laterality: Left;   BAMcIntosheft 04/11/2019   Procedure: SECOND STAGE BASILIC VEIN TRANSPOSITION LEFT ARM;  Surgeon: ClMarty HeckMD;  [place] [facility]: [place];  Laterality: Left;   IR FLUORO GUIDE CV LINE RIGHT  04/29/2019   IR [place] VASC ACCESS RIGHT  04/29/2019   OPEN REDUCTION INTERNAL FIXATION (ORIF) DISTAL RADIAL FRACTURE Left 01/28/2016   Procedure: OPEN REDUCTION INTERNAL FIXATION (ORIF) DISTAL RADIAL FRACTURE;  Surgeon: FrIran PlanasMD;  [place] [facility] [place]: [place]opedics;  Laterality: Left;   [place] RIGHT HEART CATH N/A 12/05/2019   Procedure: RIGHT HEART CATH;  Surgeon: EnNelva BushMD;  [facility];  Service: Cardiovascular;  Laterality: N/A;   TONSILLECTOMY AND ADENOIDECTOMY     age 60  Family History:   Unable to attain family history due to encephalopathy  Per chart review, both parents are deceased. Mother had a past medical history significant for stroke, diabetes, renal failure, and heart failure. Father's past medical history is significant for stroke. Sister has a past medical history of breast cancer.  Social History:  Unable to attain social history due to encephalopathy.   She lives in a nursing home ([place] [facility]). Per nursing home, patient is an active smoker.   Per chart review:  - No substance use - No alcohol use    Review of Systems: Unable to obtain due to encephalopathy   Physical Exam: Blood pressure (!) (P) 163/76, pulse (!) (P) 59, temperature 97.7 F (36.5 C), temperature source Oral, resp. rate 12, height '4\' 6"'$  (1.372 m), weight 49.7 kg, last menstrual period 03/06/2013, SpO2 100 %.  General: middle-aged woman, lying in bed, somnolent, no acute distress  HENT: Normocephalic, atraumatic; Dry Mucous Membranes Eyes: PERRL, EOM  CV: Normal rate, regular rhythm; normal S1 and S2; holosystolic murmur at left sternal border   Lungs: Anterior lung sounds clear to auscultation, bilaterally; no wheezing, ronchi, rales Abdomen: Soft, distended; hypoactive bowel sounds; pain to palpation at lower abdomen Extremities: No clubbing, edema, or erythema  Neuro: Exam limited due to mentation; Oriented to person, place, and time; CN II, III, IV, V, VI, VII, X intact; Able to move all 4 extremities against gravity   CMP Latest Ref Rng & Units 08/21/2020 08/21/2020 08/16/2020  Glucose 70 - 99 mg/dL - 234(H) 136(H)  BUN 6 - 20 mg/dL - 45(H) 8  Creatinine 0.44 - 1.00 mg/dL - 4.80(H) 1.70(H)  Sodium 135 - 145 mmol/L 133(L) 133(L) 140   Potassium 3.5 - 5.1 mmol/L 4.0 4.1 3.2(L)  Chloride 98 - 111 mmol/L - 90(L) 97(L)  CO2 22 - 32 mmol/L - 25 -  Calcium 8.9 - 10.3 mg/dL - 9.7 -  Total Protein 6.5 - 8.1 g/dL - 7.5 -  Total Bilirubin 0.3 - 1.2 mg/dL - 0.6 -  Alkaline Phos 38 - 126 U/L - 77 -  AST 15 - 41 U/L - 18 -  ALT 0 - 44 U/L - 18 -   Lipase     Component Value Date/Time   LIPASE 80 (H) 08/21/2020 0939   Lactic Acid, Venous    Component Value Date/Time   LATICACIDVEN 2.7 (HH) 08/21/2020 1206   ABG    Component Value Date/Time   PHART 7.404 08/21/2020 1009   PCO2ART 50.3 (H) 08/21/2020 1009   PO2ART 76 (L) 08/21/2020 1009   HCO3 31.5 (H) 08/21/2020 1009   TCO2 33 (H) 08/21/2020 1009   O2SAT 95.0 08/21/2020 1009   Urinalysis    Component Value Date/Time   COLORURINE STRAW (A) 08/21/2020 1154   APPEARANCEUR CLEAR 08/21/2020 1154   LABSPEC 1.002 (L) 08/21/2020 1154   PHURINE 8.0 08/21/2020 1154   GLUCOSEU >=500 (A) 08/21/2020 1154   HGBUR NEGATIVE 08/21/2020 1154   BILIRUBINUR NEGATIVE 08/21/2020 1154   BILIRUBINUR n 05/25/2012 1202   KETONESUR NEGATIVE 08/21/2020 1154   PROTEINUR 100 (A) 08/21/2020 1154   UROBILINOGEN 0.2 05/25/2012 1202   UROBILINOGEN 0.2 03/29/2011 0131   NITRITE NEGATIVE 08/21/2020 1154   LEUKOCYTESUR NEGATIVE 08/21/2020 1154      Assessment & Plan by Problem: Principal Problem:   Acute metabolic encephalopathy Active Problems:   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 (HCC)   Essential hypertension   ESRD (end stage renal disease) (HCC)  [name] Michael is a 60 year old female with PMH of ESRD on HD TTS, CVA, HTN, T2DM, CHF who presents with confusion and lethargy that began this morning.  Acute metabolic encephalopathy ESRD on HD TTS Hx of CVA Patient was scheduled for HD session today, but was feeling  somnolent this AM prompting her nursing facility to send her to the ED for further evaluation. Last session was this past Saturday. Unable  to ascertain much history from patient, but as per nursing facility, she is usually fairly independent in her ADLs. She does not manage her own medications and does not have access to OTC meds. Most likely, patient's symptomology is from not receiving HD today. Symptoms could be from uremic encephalopathy. Although infection is less likely due to patient being afebrile and no leuks on CBC and CXR negative, she is complaining of urinary frequency and discomfort with urination, so will evaluate her for potential UTI. Urinalysis was negative, but urine cultures and blood cultures are pending. COVID-19 and influenza testing negative. CT head showing no acute abnormalities, but shows chronic lacunar infarcts within bilateral basal ganglia, bilateral thalami, and left internal capsule. Although lipase is elevated, unlikely that patient has pancreatitis, so this is likely from ESRD. She does have a lactic acidosis on labs, but this is likely attributable to her ESRD. Expect for this to resolve with HD. -nephrology consulted, appreciate recs -dialysis tonight, will reevaluate patient's mental status after HD to check for improvement -holding home gabapentin, topamax, prozac (possible CNS depressants) -f/u TSH to check for thyroid abnormalities -keeping patient NPO due to AMS -f/u urine and blood cx -restart home ASA once patient passes swallow eval  HTN HLD Hx of CVA Patient on lopressor '100mg'$  BID and clonidine 0.'1mg'$  TID prn for HTN, and on lipitor '40mg'$  for HLD. Also on ASA '81mg'$  for secondary prevention. Goal BP is <130/80 given prior history of stroke along with HTN, HLD, T2DM. -holding home meds due to AMS, can restart once she passes swallow eval -received one dose of clonidine 0.'1mg'$  PO with significant improvement in BP -prn IV antihypertensives for SBP >180  Type 2 DM On humalog 12 units TID with meals. As per nursing facility, patient was hypoglycemic to 63 yesterday night, but BG improved with  instaglucose and feeding. She did not receive any insulin this AM. CBG 242 and blood glucose 234 on CMP upon arrival to ED. Main priority is to ensure patient receives HD at this time. Will optimize patient's BG tomorrow as she is currently NPO.  Chronic HFpEF (EF 55-60%) Severe TVR and RV dilatation, mild-to-mod MVR Seen on ECHO in 11/2019. She is not on any home medications for this aside from home lopressor '100mg'$  BID. -holding lopressor as she is altered   Dispo: Admit patient to Observation with expected length of stay less than 2 midnights.  Signed: Gwynneth Albright, Medical Student 08/21/2020, 6:35 PM  Pager: [phone]   Attestation for Student Documentation:  I personally was present and performed or re-performed the history, physical exam and medical decision-making activities of this service and have verified that the service and findings are accurately documented in the students note.  Virl Axe, MD 08/21/2020, 7:30 PM

## 2020-08-21 NOTE — ED Notes (Signed)
Pt oxygen dropped to 76% with good pleth while pt was sleeping. Placed pt on 2L [place].

## 2020-08-21 NOTE — ED Triage Notes (Signed)
Pt BIB [facility] from [facility]. Staff at facility checked pts CBG last night and found it was 59. Pt was given food and staff woke pt up several times throughout the night to feed her. Facility called EMS this AM for lethargy. Pt A/Ox4. CBG with EMS 308. CBG currently 242. EMS stated pt slept en route but would awaken easily. Pt is a dialysis pt, last treatment was Saturday, due for treatment today. Pt in NAD.   EMS VS  160/80 HR 70 CBG 308

## 2020-08-21 NOTE — ED Notes (Signed)
EDP notified of critical lactic of 2.6.

## 2020-08-22 ENCOUNTER — Other Ambulatory Visit: Payer: Medicare Other

## 2020-08-22 ENCOUNTER — Observation Stay (HOSPITAL_COMMUNITY): Payer: Medicare Other

## 2020-08-22 DIAGNOSIS — M810 Age-related osteoporosis without current pathological fracture: Secondary | ICD-10-CM | POA: Diagnosis present

## 2020-08-22 DIAGNOSIS — Z20822 Contact with and (suspected) exposure to covid-19: Secondary | ICD-10-CM | POA: Diagnosis present

## 2020-08-22 DIAGNOSIS — R140 Abdominal distension (gaseous): Secondary | ICD-10-CM | POA: Diagnosis present

## 2020-08-22 DIAGNOSIS — E1122 Type 2 diabetes mellitus with diabetic chronic kidney disease: Secondary | ICD-10-CM | POA: Diagnosis present

## 2020-08-22 DIAGNOSIS — I132 Hypertensive heart and chronic kidney disease with heart failure and with stage 5 chronic kidney disease, or end stage renal disease: Secondary | ICD-10-CM | POA: Diagnosis present

## 2020-08-22 DIAGNOSIS — N186 End stage renal disease: Secondary | ICD-10-CM | POA: Diagnosis present

## 2020-08-22 DIAGNOSIS — G2581 Restless legs syndrome: Secondary | ICD-10-CM

## 2020-08-22 DIAGNOSIS — K5900 Constipation, unspecified: Secondary | ICD-10-CM | POA: Diagnosis present

## 2020-08-22 DIAGNOSIS — G9341 Metabolic encephalopathy: Secondary | ICD-10-CM

## 2020-08-22 DIAGNOSIS — D631 Anemia in chronic kidney disease: Secondary | ICD-10-CM | POA: Diagnosis present

## 2020-08-22 DIAGNOSIS — E559 Vitamin D deficiency, unspecified: Secondary | ICD-10-CM | POA: Diagnosis present

## 2020-08-22 DIAGNOSIS — F17210 Nicotine dependence, cigarettes, uncomplicated: Secondary | ICD-10-CM | POA: Diagnosis present

## 2020-08-22 DIAGNOSIS — E1165 Type 2 diabetes mellitus with hyperglycemia: Secondary | ICD-10-CM | POA: Diagnosis present

## 2020-08-22 DIAGNOSIS — K219 Gastro-esophageal reflux disease without esophagitis: Secondary | ICD-10-CM | POA: Diagnosis present

## 2020-08-22 DIAGNOSIS — Z992 Dependence on renal dialysis: Secondary | ICD-10-CM | POA: Diagnosis not present

## 2020-08-22 DIAGNOSIS — R4182 Altered mental status, unspecified: Secondary | ICD-10-CM | POA: Diagnosis present

## 2020-08-22 DIAGNOSIS — I5032 Chronic diastolic (congestive) heart failure: Secondary | ICD-10-CM | POA: Diagnosis present

## 2020-08-22 DIAGNOSIS — E785 Hyperlipidemia, unspecified: Secondary | ICD-10-CM | POA: Diagnosis present

## 2020-08-22 DIAGNOSIS — R296 Repeated falls: Secondary | ICD-10-CM | POA: Diagnosis present

## 2020-08-22 DIAGNOSIS — R0902 Hypoxemia: Secondary | ICD-10-CM | POA: Diagnosis present

## 2020-08-22 DIAGNOSIS — E872 Acidosis: Secondary | ICD-10-CM | POA: Diagnosis present

## 2020-08-22 DIAGNOSIS — Z8616 Personal history of COVID-19: Secondary | ICD-10-CM | POA: Diagnosis not present

## 2020-08-22 DIAGNOSIS — Z8673 Personal history of transient ischemic attack (TIA), and cerebral infarction without residual deficits: Secondary | ICD-10-CM | POA: Diagnosis not present

## 2020-08-22 DIAGNOSIS — F32A Depression, unspecified: Secondary | ICD-10-CM | POA: Diagnosis present

## 2020-08-22 DIAGNOSIS — Z794 Long term (current) use of insulin: Secondary | ICD-10-CM | POA: Diagnosis not present

## 2020-08-22 DIAGNOSIS — Z79899 Other long term (current) drug therapy: Secondary | ICD-10-CM | POA: Diagnosis not present

## 2020-08-22 LAB — CBC WITH DIFFERENTIAL/PLATELET
Abs Immature Granulocytes: 0.02 10*3/uL (ref 0.00–0.07)
Basophils Absolute: 0 10*3/uL (ref 0.0–0.1)
Basophils Relative: 1 %
Eosinophils Absolute: 0.1 10*3/uL (ref 0.0–0.5)
Eosinophils Relative: 2 %
HCT: 35.2 % — ABNORMAL LOW (ref 36.0–46.0)
Hemoglobin: 11.2 g/dL — ABNORMAL LOW (ref 12.0–15.0)
Immature Granulocytes: 0 %
Lymphocytes Relative: 13 %
Lymphs Abs: 1 10*3/uL (ref 0.7–4.0)
MCH: 29.9 pg (ref 26.0–34.0)
MCHC: 31.8 g/dL (ref 30.0–36.0)
MCV: 94.1 fL (ref 80.0–100.0)
Monocytes Absolute: 0.6 10*3/uL (ref 0.1–1.0)
Monocytes Relative: 9 %
Neutro Abs: 5.5 10*3/uL (ref 1.7–7.7)
Neutrophils Relative %: 75 %
Platelets: 285 10*3/uL (ref 150–400)
RBC: 3.74 MIL/uL — ABNORMAL LOW (ref 3.87–5.11)
RDW: 17.4 % — ABNORMAL HIGH (ref 11.5–15.5)
WBC: 7.3 10*3/uL (ref 4.0–10.5)
nRBC: 0 % (ref 0.0–0.2)

## 2020-08-22 LAB — HIV ANTIBODY (ROUTINE TESTING W REFLEX): HIV Screen 4th Generation wRfx: NONREACTIVE

## 2020-08-22 LAB — RENAL FUNCTION PANEL
Albumin: 3.3 g/dL — ABNORMAL LOW (ref 3.5–5.0)
Anion gap: 13 (ref 5–15)
BUN: 6 mg/dL (ref 6–20)
CO2: 25 mmol/L (ref 22–32)
Calcium: 8.2 mg/dL — ABNORMAL LOW (ref 8.9–10.3)
Chloride: 105 mmol/L (ref 98–111)
Creatinine, Ser: 1.55 mg/dL — ABNORMAL HIGH (ref 0.44–1.00)
GFR, Estimated: 38 mL/min — ABNORMAL LOW (ref >60)
Glucose, Bld: 160 mg/dL — ABNORMAL HIGH (ref 70–99)
Phosphorus: 1.8 mg/dL — ABNORMAL LOW (ref 2.5–4.6)
Potassium: 3.5 mmol/L (ref 3.5–5.1)
Sodium: 143 mmol/L (ref 135–145)

## 2020-08-22 LAB — GLUCOSE, CAPILLARY
Glucose-Capillary: 114 mg/dL — ABNORMAL HIGH (ref 70–99)
Glucose-Capillary: 128 mg/dL — ABNORMAL HIGH (ref 70–99)
Glucose-Capillary: 140 mg/dL — ABNORMAL HIGH (ref 70–99)
Glucose-Capillary: 180 mg/dL — ABNORMAL HIGH (ref 70–99)
Glucose-Capillary: 223 mg/dL — ABNORMAL HIGH (ref 70–99)
Glucose-Capillary: 124 mg/dL — ABNORMAL HIGH (ref 70–99)

## 2020-08-22 LAB — URINE CULTURE: Culture: NO GROWTH

## 2020-08-22 LAB — HEMOGLOBIN A1C
Hgb A1c MFr Bld: 6.8 % — ABNORMAL HIGH (ref 4.8–5.6)
Mean Plasma Glucose: 148.46 mg/dL

## 2020-08-22 LAB — LACTIC ACID, PLASMA: Lactic Acid, Venous: 1.8 mmol/L (ref 0.5–1.9)

## 2020-08-22 LAB — TSH: TSH: 4.393 u[IU]/mL (ref 0.350–4.500)

## 2020-08-22 MED ORDER — SENNOSIDES-DOCUSATE SODIUM 8.6-50 MG PO TABS: 1.0000 | ORAL_TABLET | Freq: Every evening | ORAL | Status: DC | PRN

## 2020-08-22 MED ORDER — ATORVASTATIN CALCIUM 40 MG PO TABS
40.0000 mg | ORAL_TABLET | Freq: Every day | ORAL | Status: DC
  Administered 2020-08-22: 40 mg via ORAL
  Filled 2020-08-22: qty 1

## 2020-08-22 MED ORDER — METOPROLOL TARTRATE 100 MG PO TABS
100.0000 mg | ORAL_TABLET | Freq: Two times a day (BID) | ORAL | Status: DC
  Administered 2020-08-22 – 2020-08-23 (×3): 100 mg via ORAL
  Filled 2020-08-22 (×3): qty 1

## 2020-08-22 MED ORDER — HYDRALAZINE HCL 20 MG/ML IJ SOLN
5.0000 mg | INTRAMUSCULAR | Status: DC | PRN
  Administered 2020-08-22 – 2020-08-23 (×2): 5 mg via INTRAVENOUS
  Filled 2020-08-22 (×2): qty 1

## 2020-08-22 MED ORDER — LORAZEPAM BOLUS VIA INFUSION: 0.5000 mg | Freq: Once | INTRAVENOUS | Status: DC

## 2020-08-22 MED ORDER — LORAZEPAM 2 MG/ML IJ SOLN
0.5000 mg | Freq: Once | INTRAMUSCULAR | Status: AC
  Administered 2020-08-22: 0.5 mg via INTRAVENOUS
  Filled 2020-08-22: qty 1

## 2020-08-22 MED ORDER — INSULIN ASPART 100 UNIT/ML ~~LOC~~ SOLN
0.0000 [IU] | Freq: Three times a day (TID) | SUBCUTANEOUS | Status: DC
  Administered 2020-08-22: 3 [IU] via SUBCUTANEOUS
  Administered 2020-08-23: 2 [IU] via SUBCUTANEOUS

## 2020-08-22 MED ORDER — FLUOXETINE HCL 10 MG PO CAPS
10.0000 mg | ORAL_CAPSULE | Freq: Every day | ORAL | Status: DC
  Administered 2020-08-22: 10 mg via ORAL
  Filled 2020-08-22: qty 1

## 2020-08-22 MED ORDER — ASPIRIN 81 MG PO CHEW
81.0000 mg | CHEWABLE_TABLET | Freq: Every day | ORAL | Status: DC
  Administered 2020-08-22: 81 mg via ORAL
  Filled 2020-08-22: qty 1

## 2020-08-22 MED ORDER — ACETAMINOPHEN 325 MG PO TABS: 650.0000 mg | ORAL_TABLET | Freq: Four times a day (QID) | ORAL | Status: DC | PRN

## 2020-08-22 MED ORDER — POLYETHYLENE GLYCOL 3350 17 G PO PACK
17.0000 g | PACK | Freq: Every day | ORAL | Status: DC
  Administered 2020-08-22: 17 g via ORAL
  Filled 2020-08-22: qty 1

## 2020-08-22 NOTE — Progress Notes (Addendum)
Subjective:  [name] Michael is a 60 y.o. woman with a history of ESRD on dialysis TTS, stroke, and hypertension who was admitted yesterday for evaluation of lethargy. Received hemodialysis overnight, no additional acute events noted.   This morning, patient reports feeling better with reduced lethargy. She is unable to recall when symptoms of lethargy began or if any changes to her medication management were made prior to symptom onset. Reports numerous falls over the past week with associated pain at the back of the head. Currently reports dizziness with "room spinning", which started prior to admission yesterday. Dizziness is constant and non-positional. Also notes tremor of the bilateral lower extremities that improve with purposeful movement. Notes nausea, no vomiting.    Objective:  Vital signs in last 24 hours: Vitals:   08/21/20 2240 08/21/20 2352 08/22/20 0100 08/22/20 0226  BP: (!) 158/71 (!) 173/64  (!) 175/72  Pulse: 67 75    Resp:  19    Temp: 98.3 F (36.8 C) 98.9 F (37.2 C) 98.9 F (37.2 C)   TempSrc: Oral Oral Oral   SpO2:  100%    Weight:      Height:       Physical Exam:  General: Alert; appears mildly uncomfortable; conversational; no acute distress Eyes: EOM intact; no nystagmus  Cardiovascular: Regular rate, rhythm; normal S1, S2; holosytolic murmur at left sternal border  Lungs: Normal respiratory effort; no respiratory distress  Abdomen: Soft, nontender; moderately distended Neuro: Oriented x3; no asterixis Extremities: Lower extremity tremors visible at rest; no hand tremors; no cyanosis, edema   Weight change:    Filed Weights   08/21/20 0914 08/21/20 1815 08/21/20 2220  Weight: 43.1 kg 49.7 kg 48.2 kg  Weight on admission (08/21/20; 09:14): 43.1 kg Net change: +5.1 kg   Intake/Output Summary (Last 24 hours) at 08/22/2020 1332 Last data filed at 08/21/2020 2220 Gross per 24 hour  Intake --  Output 1500 ml  Net -1500 ml    Labs:  CBC Latest  Ref Rng & Units 08/22/2020 08/21/2020 08/21/2020  WBC 4.0 - 10.5 K/uL 7.3 - 8.0  Hemoglobin 12.0 - 15.0 g/dL 11.2(L) 11.2(L) 11.2(L)  Hematocrit 36.0 - 46.0 % 35.2(L) 33.0(L) 37.4  Platelets 150 - 400 K/uL 285 - 332   BMP Latest Ref Rng & Units 08/22/2020 08/21/2020 08/21/2020  Glucose 70 - 99 mg/dL 160(H) - 234(H)  BUN 6 - 20 mg/dL 6 - 45(H)  Creatinine 0.44 - 1.00 mg/dL 1.55(H) - 4.80(H)  Sodium 135 - 145 mmol/L 143 133(L) 133(L)  Potassium 3.5 - 5.1 mmol/L 3.5 4.0 4.1  Chloride 98 - 111 mmol/L 105 - 90(L)  CO2 22 - 32 mmol/L 25 - 25  Calcium 8.9 - 10.3 mg/dL 8.2(L) - 9.7  Phosphorus- 1.8 mg/dL GFR, estimated- 38 mg/dL, from 10  Anion Gap- 13, from 18  Lactic Acid- 1.8 mmol/L, from 2.7  TSH- 4.393 uIU/mL HgbA1c- 6.8%  Urine Culture- Negative  Blood Culture- Pending   Ferritin- Pending   Imaging in Last 24 Hours:  Abdominal X-Ray: Moderate severity stool burden without evidence of bowel obstruction  MRI Brain (w/o contrast): Patient could not tolerate all sequences. No acute infarction.   Assessment/Plan:  In summary, patient is a 60 y.o. woman with history of ESRD on dialysis TTS, stroke, and hypertension who presented with lethargy, which improved this morning following dialysis. She has new symptoms of dizziness and tremors.  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 Metabolic panel normalizing today with notable improvement in BUN. Patient's polypharmacy, including use of gabapentin in the setting of ESRD and topiramate (started on 08/01/2020), may also be contributing to symptoms. No signs of infection, blood cultures pending. Thyroid panel normal.   -Discontinue gabapentin and topiramate -Continue hemodialysis on usual schedule -MRI brain negative for acute cerebral ischemia -PT recommending subacute rehab at skilled nursing facility as the patient required moderate assist to sit at the edge of the bed.  Dizziness and restless legs Ischemic vascular disease Patient presents with tremors of the bilateral lower extremities. This is possibly due to restless leg syndrome (RLS) given improvement with movement. Iron studies ordered to investigate possible etiology of RLS.  Exam is fairly reassuring, no significant neurologic deficits, no nystagmus.  We did do an MRI given her history of ischemic events, it was motion degraded but ruled out a large posterior circulation events.  TIA still possible, small posterior circulation event still possible, we are managing this medically. -Anticipating dizziness will improve with further supportive care -No role for meclizine, likely more harm than benefit given history of encephalopathy. -Aspirin 81 mg daily, atorvastatin 40 mg daily  ESRD Greatly appreciate nephrology consultation.  She is back on schedule with Tuesday, Thursday, Saturday HD.  Appears euvolemic today.  Type II Diabetes Controlled with HgbA1c of 6.8% overnight. Blood glucose well-controlled in the 100s on sliding scale insulin during admission. Receiving HUMALOG 12 units (TID) at nursing facility.  - Continue sensitive sliding scale (TID w/ meals) - Continue monitor capillary blood glucose to evaluate control  DVT prophylaxis with heparin 5000 units every 8 hours  Disposition to subacute rehab, potentially could do this at [facility] where she is living in assisted living.  Appreciate transition of care team help.   LOS: 0 days   Gwynneth Albright, Medical Student 08/22/2020, 1:32 PM

## 2020-08-22 NOTE — Care Management Obs Status (Signed)
[place] NOTIFICATION   Patient Details  Name: [name] Michael MRN: [id number] Date of Birth: 1961/05/10   Medicare Observation Status Notification Given:  Yes    Joanne Chars, LCSW 08/22/2020, 2:51 PM

## 2020-08-22 NOTE — Plan of Care (Signed)

## 2020-08-22 NOTE — Progress Notes (Signed)
Patient resides (LTC) at [facility]. Navigator spoke with Dr. Court Joy to request that patient be rounded on first in the am and if she is cleared for discharge, Navigator will attempt to get [place] transporter to pick her up from the hospital to take her to her outpatient HD clinic[facility] for HD treatment to assist in keeping our inpatient unit clear for patients who are not discharging and cannot have HD elsewhere. Dr. Court Joy agrees. Dr. [place]/Nephrologist agrees. Navigator spoke with TOC/CSW G. Rose Fillers who agrees and called [place]/[facility] who got her transporter on the phone. They report they should be able to provide transportation tomorrow. Navigator greatly appreciates team effort to get patient to outpatient HD clinic tomorrow if cleared for discharge and will follow up in the am.   Alphonzo Cruise, [place] Renal Navigator [phone]

## 2020-08-22 NOTE — Evaluation (Signed)
Physical Therapy Evaluation Patient Details Name: [name] Michael MRN: [id number] DOB: Mar 24, 1961 Today's Date: 08/22/2020   History of Present Illness  Pt is a 60 y/o female admitted secondary to acute encephalopathy. PMH includes DM, HTN, ESRD on HD, and CVA.  Clinical Impression  Pt admitted secondary to problem above with deficits below. Pt with cognitive deficits, and unsure of baseline. Requiring mod A to sit at EOB this session. Pt reporting dizziness, so further mobility deferred. Per pt, lives at [facility] and could walk some. Also reports she used WC some depending on the day. Feel pt would benefit from PT services at [facility] upon d/c to ensure safety with mobility. Will continue to follow acutely.     Follow Up Recommendations SNF;Supervision/Assistance - 24 hour    Equipment Recommendations  None recommended by PT    Recommendations for Other Services       Precautions / Restrictions Precautions Precautions: Fall Restrictions Weight Bearing Restrictions: No      Mobility  Bed Mobility Overal bed mobility: Needs Assistance Bed Mobility: Supine to Sit;Sit to Supine     Supine to sit: Mod assist Sit to supine: Mod assist   General bed mobility comments: Mod A for trunk and LE assist to come to sitting. Pt with increased dizziness in sitting that did not improve (RN reports getting medication prior to MRI that may be cause). Reliant on mod A for sitting balance. Unable to attempt further mobility.    Transfers                    Ambulation/Gait                Stairs            Wheelchair Mobility    Modified Rankin (Stroke Patients Only)       Balance Overall balance assessment: Needs assistance Sitting-balance support: Bilateral upper extremity supported Sitting balance-Leahy Scale: Poor Sitting balance - Comments: reliant on UE and mod A for balance.                                     Pertinent Vitals/Pain  Pain Assessment: No/denies pain    Home Living Family/patient expects to be discharged to:: Skilled nursing facility                 Additional Comments: From [facility]    Prior Function Level of Independence: Needs assistance   Gait / Transfers Assistance Needed: Pt reports she sometimes walks and sometimes uses a WC.  ADL's / Homemaking Assistance Needed: reports typically completing ADL tasks on her own,        Hand Dominance        Extremity/Trunk Assessment   Upper Extremity Assessment Upper Extremity Assessment: Defer to OT evaluation    Lower Extremity Assessment Lower Extremity Assessment: Generalized weakness    Cervical / Trunk Assessment Cervical / Trunk Assessment: Kyphotic  Communication   Communication: No difficulties  Cognition Arousal/Alertness: Awake/alert Behavior During Therapy: Flat affect Overall Cognitive Status: No family/caregiver present to determine baseline cognitive functioning                                 General Comments: Disoriented to place and situation. Slow processing and decreased safety awareness.      General Comments  Exercises     Assessment/Plan    PT Assessment Patient needs continued PT services  PT Problem List Decreased strength;Decreased balance;Decreased mobility;Decreased activity tolerance;Decreased cognition;Decreased knowledge of use of DME;Decreased safety awareness;Decreased knowledge of precautions       PT Treatment Interventions DME instruction;Gait training;Functional mobility training;Therapeutic activities;Therapeutic exercise;Balance training;Cognitive remediation;Patient/family education    PT Goals (Current goals can be found in the Care Plan section)  Acute Rehab PT Goals Patient Stated Goal: none stated PT Goal Formulation: With patient Time For Goal Achievement: 09/05/20 Potential to Achieve Goals: Fair    Frequency Min 2X/week   Barriers to discharge         Co-evaluation               AM-PAC PT "6 Clicks" Mobility  Outcome Measure Help needed turning from your back to your side while in a flat bed without using bedrails?: A Lot Help needed moving from lying on your back to sitting on the side of a flat bed without using bedrails?: A Lot Help needed moving to and from a bed to a chair (including a wheelchair)?: A Lot Help needed standing up from a chair using your arms (e.g., wheelchair or bedside chair)?: A Lot Help needed to walk in hospital room?: A Lot Help needed climbing 3-5 steps with a railing? : Total 6 Click Score: 11    End of Session   Activity Tolerance: Treatment limited secondary to medical complications (Comment) (dizziness) Patient left: in bed;with call bell/phone within reach;with bed alarm set Nurse Communication: Mobility status PT Visit Diagnosis: Unsteadiness on feet (R26.81);Muscle weakness (generalized) (M62.81)    Time: [id number] PT Time Calculation (min) (ACUTE ONLY): 12 min   Charges:   PT Evaluation $PT Eval Moderate Complexity: 1 Mod          Reuel Derby, PT, DPT  Acute Rehabilitation Services  Pager: [phone] Office: [phone]   Rudean Hitt 08/22/2020, 3:43 PM

## 2020-08-23 ENCOUNTER — Encounter: Payer: Self-pay | Admitting: Neurology

## 2020-08-23 DIAGNOSIS — G2581 Restless legs syndrome: Secondary | ICD-10-CM | POA: Diagnosis not present

## 2020-08-23 DIAGNOSIS — G9341 Metabolic encephalopathy: Secondary | ICD-10-CM | POA: Diagnosis not present

## 2020-08-23 LAB — CBC WITH DIFFERENTIAL/PLATELET
Abs Immature Granulocytes: 0.02 10*3/uL (ref 0.00–0.07)
Basophils Absolute: 0.1 10*3/uL (ref 0.0–0.1)
Basophils Relative: 1 %
Eosinophils Absolute: 0.2 10*3/uL (ref 0.0–0.5)
Eosinophils Relative: 3 %
HCT: 36.5 % (ref 36.0–46.0)
Hemoglobin: 11.2 g/dL — ABNORMAL LOW (ref 12.0–15.0)
Immature Granulocytes: 0 %
Lymphocytes Relative: 33 %
Lymphs Abs: 2 10*3/uL (ref 0.7–4.0)
MCH: 29.3 pg (ref 26.0–34.0)
MCHC: 30.7 g/dL (ref 30.0–36.0)
MCV: 95.5 fL (ref 80.0–100.0)
Monocytes Absolute: 0.8 10*3/uL (ref 0.1–1.0)
Monocytes Relative: 13 %
Neutro Abs: 3.1 10*3/uL (ref 1.7–7.7)
Neutrophils Relative %: 50 %
Platelets: 295 10*3/uL (ref 150–400)
RBC: 3.82 MIL/uL — ABNORMAL LOW (ref 3.87–5.11)
RDW: 17.5 % — ABNORMAL HIGH (ref 11.5–15.5)
WBC: 6.1 10*3/uL (ref 4.0–10.5)
nRBC: 0 % (ref 0.0–0.2)

## 2020-08-23 LAB — BASIC METABOLIC PANEL
Anion gap: 11 (ref 5–15)
BUN: 23 mg/dL — ABNORMAL HIGH (ref 6–20)
CO2: 24 mmol/L (ref 22–32)
Calcium: 9.3 mg/dL (ref 8.9–10.3)
Chloride: 105 mmol/L (ref 98–111)
Creatinine, Ser: 3.86 mg/dL — ABNORMAL HIGH (ref 0.44–1.00)
GFR, Estimated: 13 mL/min — ABNORMAL LOW (ref >60)
Glucose, Bld: 101 mg/dL — ABNORMAL HIGH (ref 70–99)
Potassium: 3.8 mmol/L (ref 3.5–5.1)
Sodium: 140 mmol/L (ref 135–145)

## 2020-08-23 LAB — FERRITIN: Ferritin: 1676 ng/mL — ABNORMAL HIGH (ref 11–307)

## 2020-08-23 LAB — GLUCOSE, CAPILLARY
Glucose-Capillary: 128 mg/dL — ABNORMAL HIGH (ref 70–99)
Glucose-Capillary: 152 mg/dL — ABNORMAL HIGH (ref 70–99)

## 2020-08-23 MED ORDER — HUMALOG 100 UNIT/ML ~~LOC~~ SOLN: 5.0000 [IU] | Freq: Three times a day (TID) | SUBCUTANEOUS | 11 refills | Status: DC

## 2020-08-23 NOTE — Progress Notes (Signed)
Pt's facility transport arrived and up on unit with pt's wheelchair to pick her up. Unit NT assisted with pt into the wheelchair and packed pt's belongings to send with her. Pt was d/c'd via wheelchair with transport from [facility] and will be taking pt to her dialysis center from here.

## 2020-08-23 NOTE — Discharge Instructions (Signed)
[name] Michael,  We are stopping some of your home medications and think your confusion was due to multiple factors.  We were able to rule out an acute stroke with head imaging.  We recommend you follow-up with your primary care physician in 1 week for review of your medications.  Also, recommend you follow-up with your neurologist, Dr. [name], in 2 weeks.  My best,  Dr.Nefertari Rebman

## 2020-08-23 NOTE — Discharge Summary (Signed)
Name: [name] Michael MRN: [id number] DOB: Feb 01, 1961 60 y.o. PCP: [name] Rakes, MD  Date of Admission: 08/21/2020  9:03 AM Date of Discharge: 08/23/2020 Attending Physician: Axel Filler, *  Discharge Diagnosis: 1. Acute Metabolic Encephalopathy 2. Restless Leg Syndrome   Discharge Medications: Allergies as of 08/23/2020      Reactions   Hydrocodone Nausea And Vomiting, Other (See Comments)   "Allergic," per [facility]      Medication List    STOP taking these medications   cloNIDine 0.1 MG tablet Commonly known as: CATAPRES   gabapentin 100 MG capsule Commonly known as: NEURONTIN   guaiFENesin 100 MG/5ML Soln Commonly known as: ROBITUSSIN   loratadine 10 MG tablet Commonly known as: CLARITIN   sodium bicarbonate 650 MG tablet   topiramate 25 MG tablet Commonly known as: Topamax     TAKE these medications   aspirin 81 MG chewable tablet Chew 81 mg by mouth daily.   atorvastatin 40 MG tablet Commonly known as: LIPITOR Take 40 mg by mouth at bedtime.   Centrum Silver 50+Women Tabs Take 1 tablet by mouth in the morning.   famotidine 10 MG tablet Commonly known as: PEPCID Take 10 mg by mouth in the morning.   feeding supplement (PRO-STAT SUGAR FREE 64) Liqd Take 30 mLs by mouth in the morning. WILD CHERRY FLAVOR   FLUoxetine 10 MG capsule Commonly known as: PROZAC Take 10 mg by mouth daily.   HumaLOG 100 UNIT/ML injection Generic drug: insulin lispro Inject 0.05 mLs (5 Units total) into the skin 3 (three) times daily with meals. What changed: how much to take   metoprolol tartrate 100 MG tablet Commonly known as: LOPRESSOR Take 100 mg by mouth 2 (two) times daily.   omega-3 acid ethyl esters 1 g capsule Commonly known as: LOVAZA Take 2 capsules by mouth 2 (two) times daily.   omeprazole 40 MG capsule Commonly known as: PRILOSEC Take 40 mg by mouth daily before breakfast.   polyethylene glycol powder 17 GM/SCOOP powder Commonly known as:  GLYCOLAX/MIRALAX Take 17 g by mouth See admin instructions. Mix 17 grams of powder into 8 ounces of fluid, stir, and drink (by mouth) once a day   SACCHAROMYCES BOULARDII PO Take 1 capsule by mouth 2 (two) times daily.   senna-docusate 8.6-50 MG tablet Commonly known as: Senokot-S Take 1 tablet by mouth at bedtime as needed for mild constipation.   sevelamer carbonate 800 MG tablet Commonly known as: RENVELA Take 800 mg by mouth 3 (three) times daily with meals.   Vitamin D (Ergocalciferol) 1.25 MG (50000 UNIT) Caps capsule Commonly known as: DRISDOL Take 1 capsule (50,000 Units total) by mouth every 7 (seven) days.       Disposition and follow-up:   [name] Michael was discharged from [facility] in Stable condition.  At the hospital follow up visit please address:  1.  Acute metabolic encephalopathy       Patient presented oriented but had lethargy.  This improved with dialysis and therefore it is likely from acute metabolic encephalopathy. CT Head and MRI Brain ruled out acute stroke. Overall patient is very susceptible to acute changes and we discontinued some centrally acting medications. - Please review patient medications   Restless leg syndrome Patient has bilateral lower extremity tremor which improves with movement.  With it this is likely due to restless leg syndrome, consider treatment if this is causing patient distress.  Hypertension Patient on metoprolol and clonidine.  Metoprolol  continued.  Patient has swings in blood pressure ranging from SBP 180 down to SBP 90 with clonidine. Recommend trying calcium channel blocker or other antihypertensives.  2.  Labs / imaging needed at time of follow-up: n/a  3.  Pending labs/ test needing follow-up: n/a  Follow-up Appointments:  Follow-up Information    [name] Rakes, MD Follow up in 1 week(s).   Specialty: Family Medicine Why: Patient needs follow up with her PCP in 1 week Contact  information: [place] [address] [phone]        Fay Records, MD .   Specialty: Cardiology Contact information: [place] [address] [phone]               Hospital Course by problem list: 1. Acute Metabolic Encephalopathy  ESRD on HD TTS  [name] Michael is a 60 y.o. woman with a past medical history of end-stage renal disease on hemodialysis since November 2020, stroke, diabetes, and hypertension who was admitted for somnolence. On chart review, patient had two similar events in the last year, which improved with supportive care. Held home centrally acting medications, including gabapentin and topamax, during stay. Prior dialysis session was three days prior to arrival. Nephrology was consulted due to history of ESRD and multiple metabolic abnormalities noted on labs. Due to abrupt onset altered mental status and history of ischemic events, CT Head was obtained and did not show any acute abnormalities but did show chronic lacunar infarcts in the bilateral basal ganglia, bilateral thalami, and left internal capsule. As per nephrology, patient was hemodialyzed during admission with significant improvement in mental status, suggesting acute metabolic encephalopathy as likely etiology. Will discontinue gabapentin and topamax until she follows-up with PCP (Dr. Margarita Rana, MD). Plan for discharge with transportation being provided today to her outpatient hemodialysis center. PT evaluated for strength and mobility and recommended SNF placement with 24 hr supervision/assistance and outpatient PT services.    Dizziness and Involuntary Movements  Following admission, patient had newfound dizziness and involuntary movements of the bilateral lower extremities. CT head and MRI brain were negative ruling out acute large posterior circulation event. Involuntary movement possibly due to restless leg syndrome. Ferritin was elevated, ruling out  iron deficiency as etiology. Will follow-up outpatient with her neurologist Dr. [name] in one month. Will hold topamax until seen by Dr. [name].    Discharge Exam:   BP (!) 145/125 (BP Location: Right Arm)   Pulse 68   Temp 98.2 F (36.8 C) (Oral)   Resp 17   Ht '4\' 6"'$  (1.372 m)   Wt 48.2 kg   LMP 03/06/2013 (LMP Unknown)   SpO2 98%   BMI 25.62 kg/m  Discharge exam:  General: NAD, chronically ill appearing  HE: Normocephalic, atraumatic , EOMI, Conjunctivae normal Cardiovascular: Normal rate, regular rhythm.  Holosystolic murmur at left sternal border Pulmonary : Effort normal, breath sounds normal. No wheezes, rales, or rhonchi Abdominal: soft, nontender,  bowel sounds present Neuro: Alert and oriented x4     Pertinent Labs, Studies, and Procedures:  CMP Latest Ref Rng & Units 08/23/2020 08/22/2020 08/21/2020  Glucose 70 - 99 mg/dL 101(H) 160(H) -  BUN 6 - 20 mg/dL 23(H) 6 -  Creatinine 0.44 - 1.00 mg/dL 3.86(H) 1.55(H) -  Sodium 135 - 145 mmol/L 140 143 133(L)  Potassium 3.5 - 5.1 mmol/L 3.8 3.5 4.0  Chloride 98 - 111 mmol/L 105 105 -  CO2 22 - 32 mmol/L 24 25 -  Calcium 8.9 - 10.3 mg/dL 9.3 8.2(L) -  Total Protein 6.5 - 8.1 g/dL - - -  Total Bilirubin 0.3 - 1.2 mg/dL - - -  Alkaline Phos 38 - 126 U/L - - -  AST 15 - 41 U/L - - -  ALT 0 - 44 U/L - - -   CBC Latest Ref Rng & Units 08/23/2020 08/22/2020 08/21/2020  WBC 4.0 - 10.5 K/uL 6.1 7.3 -  Hemoglobin 12.0 - 15.0 g/dL 11.2(L) 11.2(L) 11.2(L)  Hematocrit 36.0 - 46.0 % 36.5 35.2(L) 33.0(L)  Platelets 150 - 400 K/uL 295 285 -   DG Abd 1 View  Result Date: 08/21/2020 CLINICAL DATA:  Abdominal distension. EXAM: ABDOMEN - 1 VIEW COMPARISON:  April 28, 2019 FINDINGS: The bowel gas pattern is normal. A moderate amount of stool is seen within the ascending colon. No radio-opaque calculi or other significant radiographic abnormality are seen. IMPRESSION: 1. Moderate severity stool burden without evidence of bowel obstruction.  Electronically Signed   By: Virgina Norfolk M.D.   On: 08/21/2020 23:28   CT Head Wo Contrast  Result Date: 08/21/2020 CLINICAL DATA:  Head trauma, abnormal mental status. Fall, headache, somnolence. Additional history provided: Multiple recent falls, occipital headache. EXAM: CT HEAD WITHOUT CONTRAST TECHNIQUE: Contiguous axial images were obtained from the base of the skull through the vertex without intravenous contrast. COMPARISON:  Prior head CT examinations 06/28/2020 and earlier. Brain MRI/MRA 03/29/2020. FINDINGS: Brain: Mild cerebral and cerebellar atrophy. Redemonstrated known chronic lacunar infarcts within the bilateral basal ganglia, bilateral thalami and left internal capsule. Background moderate ill-defined hypoattenuation within the cerebral white matter is nonspecific, but compatible with chronic small vessel ischemic disease. There is no acute intracranial hemorrhage. No demarcated cortical infarct. No extra-axial fluid collection. No evidence of intracranial mass. No midline shift. Vascular: No hyperdense vessel.  Atherosclerotic calcifications. Skull: Normal. Negative for fracture or focal lesion. Sinuses/Orbits: Visualized orbits show no acute finding. Tiny left maxillary sinus mucous retention cyst. Frothy secretions within the right sphenoid sinus. IMPRESSION: No evidence of acute intracranial abnormality. Known chronic lacunar infarcts within the bilateral basal ganglia, bilateral thalami and left internal capsule. Stable background mild generalized parenchymal atrophy and moderate chronic small vessel ischemic disease. Right sphenoid sinusitis. Electronically Signed   By: Kellie Simmering DO   On: 08/21/2020 11:17   MR BRAIN WO CONTRAST  Result Date: 08/22/2020 CLINICAL DATA:  Altered mental status EXAM: MRI HEAD WITHOUT CONTRAST TECHNIQUE: Multiplanar, multiecho pulse sequences of the brain and surrounding structures were obtained without intravenous contrast. COMPARISON:  03/29/2020  FINDINGS: Motion artifact is present. DWI and axial T2 sequences were obtained. Patient could not tolerate remainder of the study. There is no acute infarction or intracranial hemorrhage. Patchy T2 hyperintensity in the supratentorial and pontine white matter likely reflects similar chronic microvascular ischemic changes. Superimposed chronic small vessel infarcts again identified. No significant mass effect identified. 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 IMPRESSION: Motion degraded study. Patient could not tolerate all sequences. No acute infarction. Electronically Signed   By: Macy Mis M.D.   On: 08/22/2020 12:59   DG Chest Portable 1 View  Result Date: 08/21/2020 CLINICAL DATA:  Chills and cough.  Shortness of breath. EXAM: PORTABLE CHEST 1 VIEW COMPARISON:  One-view chest x-ray 06/28/2020 FINDINGS: The heart is enlarged. Atherosclerotic calcifications are present at the aortic arch. No edema or effusion is present. No focal airspace disease is present. IMPRESSION: Cardiomegaly without failure. Electronically Signed   By: Harrell Gave  Mattern M.D.   On: 08/21/2020 10:40   Discharge Instructions: Discharge Instructions    Diet - low sodium heart healthy   Complete by: As directed    Increase activity slowly   Complete by: As directed    No wound care   Complete by: As directed       Signed: Madalyn Rob, MD 08/23/2020, 8:38 AM

## 2020-08-23 NOTE — TOC Initial Note (Addendum)
Transition of Care [facility]) - Initial/Assessment Note    Patient Details  Name: [name] Michael MRN: [id number] Date of Birth: 10-17-1960  Transition of Care [facility]) CM/SW Contact:    Joanne Chars, LCSW Phone Number: 08/23/2020, 9:04 AM  Clinical Narrative:   CSW contacted by Terri Piedra, renal navigator, regarding plan for pt to DC in time for outpt HD today.  Jaclyn Shaggy has spoken to [facility] about pt return to LTC at that facility.  Pt unable to meaningfully participate in assessment.  CSW attempted to speak to pt sister and left message for her that pt would DC back to [facility].  CSW did speak with sister yesterday while giving MOON to pt and she confirmed that pt is long term resident at [facility].  CSW unable to reach [place] at [facility].   CSW received call from sister 17.  Informed that pt returning to [facility].  Pt does not have legal guardian or POA and is own Media planner.  CSW received call from [place] at [facility].  She just needs DC summary and FL2, which were sent.  Donneta Romberg will send their transportation service to pick up pt.               Expected Discharge Plan: Long Term Nursing Home Barriers to Discharge: No Barriers Identified   Patient Goals and CMS Choice        Expected Discharge Plan and Services Expected Discharge Plan: [place]       Living arrangements for the past 2 months: [place] Expected Discharge Date: 08/23/20                                    Prior Living Arrangements/Services Living arrangements for the past 2 months: [place] Lives with:: Facility Resident Patient language and need for interpreter reviewed:: Yes        Need for Family Participation in Patient Care: Yes (Comment) Care giver support system in place?: Yes (comment) (sister) Current home services:  (unknown)    Activities of Daily Living Home Assistive Devices/Equipment: None ADL Screening  (condition at time of admission) Patient's cognitive ability adequate to safely complete daily activities?: Yes Is the patient deaf or have difficulty hearing?: No Does the patient have difficulty seeing, even when wearing glasses/contacts?: No Does the patient have difficulty concentrating, remembering, or making decisions?: Yes Patient able to express need for assistance with ADLs?: Yes Does the patient have difficulty dressing or bathing?: No Independently performs ADLs?: Yes (appropriate for developmental age) Does the patient have difficulty walking or climbing stairs?: Yes Weakness of Legs: Both Weakness of Arms/Hands: Right  Permission Sought/Granted                  Emotional Assessment Appearance:: Appears older than stated age Attitude/Demeanor/Rapport: Unable to Assess Affect (typically observed): Unable to Assess Orientation: : Oriented to Self,Oriented to Place,Oriented to Situation      Admission diagnosis:  Abdominal distention [R14.0] Somnolence [R40.0] Altered mental status [R41.82] Hypoxia [R09.02] Patient Active Problem List   Diagnosis Date Noted  . Altered mental status 08/22/2020  . Acute metabolic encephalopathy 99991111  . Severe tricuspid regurgitation 12/04/2019  . ESRD (end stage renal disease) ([place])   . GERD (gastroesophageal reflux disease)   . Hypertension   . Type II diabetes mellitus ([place])   . 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  AMS (altered mental status) 04/28/2019  . Hypoxemia 04/28/2019  . Fall 06/09/2018  . Fall at home, initial encounter 06/09/2018  . Anemia of chronic disease 06/09/2018  . Syncope and collapse 06/09/2018  . Acute encephalopathy 03/10/2018  . Hypermagnesemia 03/10/2018  . Acute lower UTI 11/09/2016  . Uncontrolled type 2 diabetes mellitus with complication ([place])   . 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 associated with diabetes mellitus due to underlying condition ([place])   . Chronic  diastolic CHF (congestive heart failure) ([place])   . Proliferative diabetic retinopathy ([place]) 04/29/2016  . Poor social situation 03/09/2013  . Depression 03/01/2013  . Abnormal mammogram 12/20/2012  . Retinal detachment 11/17/2012  . 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 ([place]) 123456  . Hyperlipidemia 02/09/2007  . Essential hypertension 02/09/2007   PCP:  Charlott Rakes, MD Pharmacy:   [place], [place] - [address] [place] [address] Phone: [phone] Fax: [phone]     Social Determinants of Health (SDOH) Interventions    Readmission Risk Interventions Readmission Risk Prevention Plan 12/07/2019 04/29/2019  Transportation Screening Complete Complete  PCP or Specialist Appt within 3-5 Days Complete Complete  HRI or [place] Complete Complete  Social Work Consult for [place] Planning/Counseling Complete Complete  Palliative Care Screening Not Applicable Not Applicable  Medication Review (RN Care Manager) Referral to Pharmacy Referral to Pharmacy  Some recent data might be hidden

## 2020-08-23 NOTE — NC FL2 (Signed)
[place] MEDICAID FL2 LEVEL OF CARE SCREENING TOOL     IDENTIFICATION  Patient Name: [name] Michael Birthdate: [birth year] Sex: female Admission Date (Current Location): 08/21/2020  [place] and [place] Number:  [name] [id number] [place] and Address:  [facility] [place], [place] [address] [place], [place] [address]      Provider Number: [id number]  Attending Physician Name and Address:  Axel Filler, *  Relative Name and Phone Number:  Wynelle Link [phone]    Current Level of Care: Hospital Recommended Level of Care: [place] Prior Approval Number:    Date Approved/Denied:   PASRR Number: [id number]  Discharge Plan: Other (Comment) (Long term care)    Current Diagnoses: Patient Active Problem List   Diagnosis Date Noted  . Altered mental status 08/22/2020  . Acute metabolic encephalopathy 99991111  . Severe tricuspid regurgitation 12/04/2019  . ESRD (end stage renal disease) ([place])   . GERD (gastroesophageal reflux disease)   . Hypertension   . Type II diabetes mellitus ([place])   . 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 AMS (altered mental status) 04/28/2019  . Hypoxemia 04/28/2019  . Fall 06/09/2018  . Fall at home, initial encounter 06/09/2018  . Anemia of chronic disease 06/09/2018  . Syncope and collapse 06/09/2018  . Acute encephalopathy 03/10/2018  . Hypermagnesemia 03/10/2018  . Acute lower UTI 11/09/2016  . Uncontrolled type 2 diabetes mellitus with complication ([place])   . 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 associated with diabetes mellitus due to underlying condition ([place])   . Chronic diastolic CHF (congestive heart failure) ([place])   . Proliferative diabetic retinopathy ([place]) 04/29/2016  . Poor social situation 03/09/2013  . Depression 03/01/2013  . Abnormal mammogram 12/20/2012  . Retinal detachment 11/17/2012  . 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 ([place])  [id number]  . Hyperlipidemia 02/09/2007  . Essential hypertension 02/09/2007    Orientation RESPIRATION BLADDER Height & Weight     Self,Time,Situation  O2 Continent Weight: 106 lb 4.2 oz (48.2 kg) Height:  '4\' 6"'$  (137.2 cm)  BEHAVIORAL SYMPTOMS/MOOD NEUROLOGICAL BOWEL NUTRITION STATUS      Continent Diet (Low sodium heart healthy.  See discharge summary)  AMBULATORY STATUS COMMUNICATION OF NEEDS Skin     Verbally Normal                       Personal Care Assistance Level of Assistance              Functional Limitations Info  Sight,Hearing,Speech Sight Info: Adequate Hearing Info: Adequate Speech Info: Adequate    SPECIAL CARE FACTORS FREQUENCY                       Contractures Contractures Info: Not present    Additional Factors Info  Code Status,Allergies,Insulin Sliding Scale Code Status Info: full Allergies Info: hydrocodone   Insulin Sliding Scale Info: Novolog, 0-9 units 3x day with meals.  See discharge summary.       Current Medications (08/23/2020):  This is the current hospital active medication list Current Facility-Administered Medications  Medication Dose Route Frequency Provider Last Rate Last Admin  . 0.9 %  sodium chloride infusion  100 mL Intravenous PRN Madalyn Rob, MD      . 0.9 %  sodium chloride infusion  100 mL Intravenous PRN Madalyn Rob, MD      . acetaminophen (TYLENOL) tablet 650 mg  650 mg Oral Q6H PRN  Iona Beard, MD      . alteplase (CATHFLO ACTIVASE) injection 2 mg  2 mg Intracatheter Once PRN Madalyn Rob, MD      . aspirin chewable tablet 81 mg  81 mg Oral Daily Madalyn Rob, MD   81 mg at 08/22/20 1000  . atorvastatin (LIPITOR) tablet 40 mg  40 mg Oral QHS Madalyn Rob, MD   40 mg at 08/22/20 2128  . Chlorhexidine Gluconate Cloth 2 % PADS 6 each  6 each Topical Q0600 Madalyn Rob, MD   6 each at 08/23/20 [phone]  . FLUoxetine (PROZAC) capsule 10 mg  10 mg Oral Daily Madalyn Rob, MD   10 mg at 08/22/20 1700  .  heparin injection 1,000 Units  1,000 Units Dialysis PRN Madalyn Rob, MD      . heparin injection 5,000 Units  5,000 Units Subcutaneous Q8H Madalyn Rob, MD   5,000 Units at 08/23/20 0525  . hydrALAZINE (APRESOLINE) injection 5 mg  5 mg Intravenous Q4H PRN Iona Beard, MD   5 mg at 08/23/20 [id number]  . insulin aspart (novoLOG) injection 0-9 Units  0-9 Units Subcutaneous TID WC Virl Axe, MD   3 Units at 08/22/20 1700  . lidocaine (PF) (XYLOCAINE) 1 % injection 5 mL  5 mL Intradermal PRN Madalyn Rob, MD      . lidocaine-prilocaine (EMLA) cream 1 application  1 application Topical PRN Madalyn Rob, MD      . metoprolol tartrate (LOPRESSOR) tablet 100 mg  100 mg Oral BID Madalyn Rob, MD   100 mg at 08/22/20 2128  . pentafluoroprop-tetrafluoroeth (GEBAUERS) aerosol 1 application  1 application Topical PRN Madalyn Rob, MD      . polyethylene glycol (MIRALAX / GLYCOLAX) packet 17 g  17 g Oral Daily Madalyn Rob, MD   17 g at 08/22/20 1044  . senna-docusate (Senokot-S) tablet 1 tablet  1 tablet Oral QHS PRN Madalyn Rob, MD         Discharge Medications: Please see discharge summary for a list of discharge medications.  Relevant Imaging Results:  Relevant Lab Results:   Additional Information [id number]  Joanne Chars, LCSW

## 2020-08-23 NOTE — Evaluation (Signed)
Occupational Therapy Evaluation Patient Details Name: [name] Michael MRN: [id number] DOB: 10-04-1960 Today's Date: 08/23/2020    History of Present Illness Pt is a 60 y/o female admitted secondary to acute encephalopathy. PMH includes DM, HTN, ESRD on HD, and CVA.   Clinical Impression   PTA, pt from [facility]. Pt reports ambulatory with Rollator or use of wheelchair though able to complete ADLs without assist. Pt presents now with deficits in strength, cognition, standing balance and endurance. Pt with initial dizziness sitting EOB that subsided with increased activity. Pt overall Min A for transfers via handheld assist (no youth RW in room) with hands on assist needed throughout to maintain standing balance. Pt overall Min A for UB ADLs and up to Max A for LB ADLs, including toileting task today, due to deficits. Pt would benefit from OT services at acute level and with return to [facility] to maximize safety/independence with ADLs/mobility.     Follow Up Recommendations  SNF    Equipment Recommendations  None recommended by OT    Recommendations for Other Services       Precautions / Restrictions Precautions Precautions: Fall Restrictions Weight Bearing Restrictions: No      Mobility Bed Mobility Overal bed mobility: Needs Assistance Bed Mobility: Supine to Sit     Supine to sit: Mod assist;HOB elevated     General bed mobility comments: Mod A for trunk advancement to EOB    Transfers Overall transfer level: Needs assistance Equipment used: 1 person hand held assist Transfers: Sit to/from [facility] Sit to Stand: Min assist Stand pivot transfers: Min assist       General transfer comment: Min A for power up and transferring bed <> BSC and then bed to recliner with handheld assist. Would need youth RW due to short stature    Balance Overall balance assessment: Needs assistance Sitting-balance support: Bilateral upper extremity  supported Sitting balance-Leahy Scale: Fair     Standing balance support: Single extremity supported;Bilateral upper extremity supported;During functional activity Standing balance-Leahy Scale: Poor Standing balance comment: reliant on at least one UE support in standing                           ADL either performed or assessed with clinical judgement   ADL Overall ADL's : Needs assistance/impaired Eating/Feeding: Set up;Sitting   Grooming: Set up;Sitting   Upper Body Bathing: Set up;Sitting   Lower Body Bathing: Moderate assistance;Sit to/from stand   Upper Body Dressing : Minimal assistance Upper Body Dressing Details (indicate cue type and reason): Min A for donning clean gown sitting EOB Lower Body Dressing: Moderate assistance;Sit to/from stand Lower Body Dressing Details (indicate cue type and reason): Able to adjust socks bed level bring feet to self but required assist for underwear mgmt in standing Toilet Transfer: Minimal assistance;Stand-pivot;BSC Toilet Transfer Details (indicate cue type and reason): Min A with handheld assist for transfer to/from [facility] (RW in room too tall for pt, would require youth RW) Toileting- Clothing Manipulation and Hygiene: Maximal assistance;Sit to/from stand Toileting - Clothing Manipulation Details (indicate cue type and reason): Max A for maintaining balance for underwear mgmt and hygiene after small BM       General ADL Comments: Limited by intermittent dizziness, decreased balance and slower processing     Vision Patient Visual Report: No change from baseline Vision Assessment?: No apparent visual deficits     Perception     Praxis  Pertinent Vitals/Pain Pain Assessment: No/denies pain     Hand Dominance Right   Extremity/Trunk Assessment Upper Extremity Assessment Upper Extremity Assessment: Generalized weakness   Lower Extremity Assessment Lower Extremity Assessment: Defer to PT evaluation   Cervical  / Trunk Assessment Cervical / Trunk Assessment: Normal   Communication Communication Communication: No difficulties   Cognition Arousal/Alertness: Awake/alert Behavior During Therapy: Flat affect Overall Cognitive Status: No family/caregiver present to determine baseline cognitive functioning                                 General Comments: Slower processing but improving mentation, aware of plan to discharge and go to dialysis today. Intermittent cues for safety and sequencing   General Comments  Pt requesting to get up in chair and have breakfast - coordinated with NT for ordering pt breakfast    Exercises     Shoulder Instructions      Home Living Family/patient expects to be discharged to:: Skilled nursing facility                                 Additional Comments: From [facility]      Prior Functioning/Environment Level of Independence: Needs assistance  Gait / Transfers Assistance Needed: Reports she uses Rollator vs wheelchair ADL's / Homemaking Assistance Needed: Reports able to complete ADLs on her own            OT Problem List: Decreased strength;Decreased activity tolerance;Impaired balance (sitting and/or standing);Decreased cognition;Decreased safety awareness      OT Treatment/Interventions: Self-care/ADL training;Therapeutic exercise;DME and/or AE instruction;Therapeutic activities;Patient/family education;Balance training    OT Goals(Current goals can be found in the care plan section) Acute Rehab OT Goals Patient Stated Goal: eat breakfast OT Goal Formulation: With patient Time For Goal Achievement: 09/06/20 Potential to Achieve Goals: Good ADL Goals Pt Will Perform Grooming: with supervision;standing Pt Will Perform Lower Body Bathing: with supervision;sitting/lateral leans;sit to/from stand Pt Will Perform Lower Body Dressing: with supervision;sitting/lateral leans;sit to/from stand Pt Will Transfer to Toilet: with  min guard assist;ambulating Pt Will Perform Toileting - Clothing Manipulation and hygiene: with supervision;sitting/lateral leans;sit to/from stand  OT Frequency: Min 2X/week   Barriers to D/C:            Co-evaluation              AM-PAC OT "6 Clicks" Daily Activity     Outcome Measure Help from another person eating meals?: A Little Help from another person taking care of personal grooming?: A Little Help from another person toileting, which includes using toliet, bedpan, or urinal?: A Lot Help from another person bathing (including washing, rinsing, drying)?: A Lot Help from another person to put on and taking off regular upper body clothing?: A Little Help from another person to put on and taking off regular lower body clothing?: A Lot 6 Click Score: 15   End of Session Equipment Utilized During Treatment: Gait belt Nurse Communication: Mobility status  Activity Tolerance: Patient tolerated treatment well Patient left: in chair;with call bell/phone within reach;with nursing/sitter in room  OT Visit Diagnosis: Unsteadiness on feet (R26.81);Other abnormalities of gait and mobility (R26.89);Muscle weakness (generalized) (M62.81);Other symptoms and signs involving cognitive function                Time: [id number] OT Time Calculation (min): 25 min Charges:  OT General Charges $OT  Visit: 1 Visit OT Evaluation $OT Eval Moderate Complexity: 1 Mod OT Treatments $Self Care/Home Management : 8-22 mins  Malachy Chamber, OTR/L Acute Rehab Services Office: [phone]  Layla Maw 08/23/2020, 9:32 AM

## 2020-08-23 NOTE — Progress Notes (Signed)
Discharge instructions reviewed with pt. Copy of instructions given to pt. SW arranging transport from pt living facility for pt pickup to take pt to her out patient dialysis center.

## 2020-08-23 NOTE — Progress Notes (Signed)
Renal Navigator spoke with [facility] staff who state transport is on the way to pick up patient to take to HD clinic/South and they will come in to the building with wheelchair to get her.  Please call Navigator if there are any questions or concerns.  Alphonzo Cruise, [place]  Renal Navigator [phone]

## 2020-08-23 NOTE — Progress Notes (Incomplete)
Subjective:  This is hospital day #2 for [name] Michael, a 60 y.o. woman with a past medical history of ESRD on dialysis, stroke, hypertension, and diabetes who presented with acute encephalopathy that improved with hemodialysis. After admission, patient found to have dizziness and involuntary movements of bilateral lower extremities.  No acute overnight events noted.    Objective:  Vital signs in last 24 hours:  BP Systolic: [id number]; BP Diastolic: A999333; 123XX123 (supine)  Pulse: 68  Temperature 98.4 F (36.9 C)  Resp. Rate: 17 Weight: 48.2 kg (up from 43.1 on admission) SpO2: 98 % on Nasal cannula    Vitals:   08/22/20 0100 08/22/20 0226 08/22/20 1553 08/22/20 2026  BP:  (!) 175/72 (!) 174/134 (!) 181/65  Pulse:   84 77  Resp:   15 18  Temp: 98.9 F (37.2 C)  98.7 F (37.1 C) 98.4 F (36.9 C)  TempSrc: Oral   Oral  SpO2:   100% 97%  Weight:      Height:        Filed Weights   08/21/20 0914 08/21/20 1815 08/21/20 2220  Weight: 43.1 kg 49.7 kg 48.2 kg    Labs in Last 24 Hours:   Ferritin: 1676 ng/mL (up from 1,086 ng/mL)  CBC Latest Ref Rng & Units 08/23/2020 08/22/2020 08/21/2020  WBC 4.0 - 10.5 K/uL 6.1 7.3 -  Hemoglobin 12.0 - 15.0 g/dL 11.2(L) 11.2(L) 11.2(L)  Hematocrit 36.0 - 46.0 % 36.5 35.2(L) 33.0(L)  Platelets 150 - 400 K/uL 295 285 -  HCT: 36.5  MCV: 95.5   Capillary blood glucose: 128 mg/dL  BMP- Pending   Imaging in Last 24 Hours:   MR BRAIN WO CONTRAST: Motion degraded study. Patient could not tolerate all sequences. No acute infarction.  Assessment/Plan:  In summary, this is hospital day #2 for [name] Michael, a 60 y.o. woman with a history of ESRD on dialysis, stroke, hypertension, and diabetes who presented with acute encephalopathy, which improved following hemodialysis. She has new symptoms of dizziness and tremors.  Principal Problem:   Acute metabolic encephalopathy Active Problems:   Poorly controlled type II diabetes mellitus with  renal complication (HCC)   Essential hypertension   ESRD (end stage renal disease) (HCC)   Altered mental status  Acute Metabolic Encephalopathy  Given improvement following hemodialysis, patient's somnolence on admission was possibly due to acute metabolic encephalopathy secondary to ESRD. BMP pending, prior metabolic panels normalizing with improvement in BUN and electrolytes. Patient's use of gabapentin in the setting of ESRD and recent initiation of topiramate (started 08/01/2020) may also be contributing to symptoms. No signs of infection. No signs of acute structural abnormalities on MRI Brain or CT Head. Thyroid panel normal.  - Discontinue gabapentin and topiramate  - Continue hemodialysis on usual Tuesday, Thursday, Saturday schedule. Arranged to receive regular hemodialysis session today.  - PT recommends rehab at patient's skilled nursing facility following evaluation yesterday and noting patient required moderate assist to sit at the bed  Dizziness and restless legs Ischemic vascular disease Patient presents with involuntary movements and dizziness of the bilateral lower extremities following admission. Involuntary movements are possibly due restless leg syndrome (RLS). Improvement with purposeful movement supports this diagnosis. Ferritin elevated, ruling out iron deficiency as a possible cause. Resting tremor secondary Parkinsons unlikely given localization of movements and absence of rigidity. Dizziness and involuntary movements may also be secondary to possible posterior circulation event. MRI brain ordered given her history of stroke and TIAs; imaging was motion degraded  but ruled out large posterior circulation event. Small posterior circulation stroke still possible, TIA sill possible. Etiology of symptoms may be multifactorial secondary to hemodialysis, hypertension, polypharmacy, and chronic infarction of deep brain structures. - PT will arrange rehab at skilled nursing facility.  Anticipating improvement with behavioral therapy, supportive care (leg massages, ambulation, applied heat)  - Discontinue meclizine, which likely  - Aspirin 81 mg daily, atorvastatin 40 mg daily   Hypertension Poorly controlled at 196/79 this morning. Consider clonidine (0.1 mg, oral) administration given prior response to medication. Hydralazine for urgent hypertension should also be considered.  - Metoprolol (100 mg, BID)  ESRD Ensure patient is back on Tuesday, Thursday, Saturday hemodialysis schedule. Nephrology consulted. Patient is scheduled to receive hemodialysis at outpatient clinic later today if discharged. Transport arranged to assisted nursing facility.  Type II Diabetes  Controlled with HgbA1c of 6.8% overnight. Blood glucose well-controlled in the 100s on sliding scale insulin during admission. Receiving HUMALOG 12 units (TID) at nursing facility.  - Continue sensitive sliding scale (TID w/ meals) - Continue monitor capillary blood glucose to evaluate control    LOS: 1 day   Gwynneth Albright, Medical Student 08/23/2020, 5:35 AM

## 2020-08-23 NOTE — Progress Notes (Signed)
Plans are in the works for patient to discharge this morning and be transported by her SNF/[facility] to her outpatient HD clinic/[facility] for HD treatment today. Team, patient, and clinic are aware. Navigator has spoken with SNF/[facility] who is going to speak with transporter and call Navigator back. SNF staff has already informed Navigator that they are able to transport patient from hospital to clinic and clinic home today, but we are just awaiting timing of pick up. Navigator is following closely.   Alphonzo Cruise, [place] Renal Navigator [phone]

## 2020-08-24 ENCOUNTER — Telehealth: Payer: Self-pay

## 2020-08-24 NOTE — Telephone Encounter (Signed)
Transition Care Management Follow-up Telephone Call Date of discharge and from where: 03/03/2021[facility]  Called pt at [phone] . Spoke with Mon from [place]. Pt was not in the room or near by phone. Left voice message to call back. Name and phone nr provided.

## 2020-08-27 ENCOUNTER — Telehealth: Payer: Self-pay | Admitting: Internal Medicine

## 2020-08-27 LAB — CULTURE, BLOOD (ROUTINE X 2)
Culture: NO GROWTH
Special Requests: ADEQUATE

## 2020-09-22 ENCOUNTER — Emergency Department (HOSPITAL_COMMUNITY)
Admission: EM | Admit: 2020-09-22 | Discharge: 2020-09-22 | Disposition: A | Discharge status: Home / Self Care | Payer: Medicare Other | Attending: Emergency Medicine | Admitting: Emergency Medicine

## 2020-09-22 DIAGNOSIS — T82838A Hemorrhage of vascular prosthetic devices, implants and grafts, initial encounter: Secondary | ICD-10-CM | POA: Diagnosis present

## 2020-09-22 DIAGNOSIS — Z794 Long term (current) use of insulin: Secondary | ICD-10-CM | POA: Diagnosis not present

## 2020-09-22 DIAGNOSIS — Z79899 Other long term (current) drug therapy: Secondary | ICD-10-CM | POA: Diagnosis not present

## 2020-09-22 DIAGNOSIS — E11311 Type 2 diabetes mellitus with unspecified diabetic retinopathy with macular edema: Secondary | ICD-10-CM | POA: Insufficient documentation

## 2020-09-22 DIAGNOSIS — Z20822 Contact with and (suspected) exposure to covid-19: Secondary | ICD-10-CM | POA: Diagnosis not present

## 2020-09-22 DIAGNOSIS — Z8673 Personal history of transient ischemic attack (TIA), and cerebral infarction without residual deficits: Secondary | ICD-10-CM | POA: Insufficient documentation

## 2020-09-22 DIAGNOSIS — E1122 Type 2 diabetes mellitus with diabetic chronic kidney disease: Secondary | ICD-10-CM | POA: Insufficient documentation

## 2020-09-22 DIAGNOSIS — T82898A Other specified complication of vascular prosthetic devices, implants and grafts, initial encounter: Secondary | ICD-10-CM | POA: Diagnosis not present

## 2020-09-22 DIAGNOSIS — Z992 Dependence on renal dialysis: Secondary | ICD-10-CM | POA: Diagnosis not present

## 2020-09-22 DIAGNOSIS — F17210 Nicotine dependence, cigarettes, uncomplicated: Secondary | ICD-10-CM | POA: Insufficient documentation

## 2020-09-22 DIAGNOSIS — I5032 Chronic diastolic (congestive) heart failure: Secondary | ICD-10-CM | POA: Insufficient documentation

## 2020-09-22 DIAGNOSIS — I132 Hypertensive heart and chronic kidney disease with heart failure and with stage 5 chronic kidney disease, or end stage renal disease: Secondary | ICD-10-CM | POA: Diagnosis not present

## 2020-09-22 DIAGNOSIS — Z7982 Long term (current) use of aspirin: Secondary | ICD-10-CM | POA: Insufficient documentation

## 2020-09-22 DIAGNOSIS — N186 End stage renal disease: Secondary | ICD-10-CM | POA: Insufficient documentation

## 2020-09-22 LAB — CBC WITH DIFFERENTIAL/PLATELET
Abs Immature Granulocytes: 0.03 10*3/uL (ref 0.00–0.07)
Basophils Absolute: 0 10*3/uL (ref 0.0–0.1)
Basophils Relative: 1 %
Eosinophils Absolute: 0.1 10*3/uL (ref 0.0–0.5)
Eosinophils Relative: 1 %
HCT: 30.3 % — ABNORMAL LOW (ref 36.0–46.0)
Hemoglobin: 9.4 g/dL — ABNORMAL LOW (ref 12.0–15.0)
Immature Granulocytes: 0 %
Lymphocytes Relative: 14 %
Lymphs Abs: 1 10*3/uL (ref 0.7–4.0)
MCH: 31.6 pg (ref 26.0–34.0)
MCHC: 31 g/dL (ref 30.0–36.0)
MCV: 102 fL — ABNORMAL HIGH (ref 80.0–100.0)
Monocytes Absolute: 0.9 10*3/uL (ref 0.1–1.0)
Monocytes Relative: 13 %
Neutro Abs: 4.8 10*3/uL (ref 1.7–7.7)
Neutrophils Relative %: 71 %
Platelets: 93 10*3/uL — ABNORMAL LOW (ref 150–400)
RBC: 2.97 MIL/uL — ABNORMAL LOW (ref 3.87–5.11)
RDW: 20.2 % — ABNORMAL HIGH (ref 11.5–15.5)
WBC: 6.8 10*3/uL (ref 4.0–10.5)
nRBC: 0.3 % — ABNORMAL HIGH (ref 0.0–0.2)

## 2020-09-22 LAB — BASIC METABOLIC PANEL
Anion gap: 15 (ref 5–15)
BUN: 30 mg/dL — ABNORMAL HIGH (ref 6–20)
CO2: 23 mmol/L (ref 22–32)
Calcium: 8.1 mg/dL — ABNORMAL LOW (ref 8.9–10.3)
Chloride: 95 mmol/L — ABNORMAL LOW (ref 98–111)
Creatinine, Ser: 4.24 mg/dL — ABNORMAL HIGH (ref 0.44–1.00)
GFR, Estimated: 11 mL/min — ABNORMAL LOW (ref >60)
Glucose, Bld: 126 mg/dL — ABNORMAL HIGH (ref 70–99)
Potassium: 3.7 mmol/L (ref 3.5–5.1)
Sodium: 133 mmol/L — ABNORMAL LOW (ref 135–145)

## 2020-09-22 LAB — RESP PANEL BY RT-PCR (FLU A&B, COVID) ARPGX2
Influenza A by PCR: NEGATIVE
Influenza B by PCR: NEGATIVE
SARS Coronavirus 2 by RT PCR: NEGATIVE

## 2020-09-22 LAB — CBG MONITORING, ED: Glucose-Capillary: 123 mg/dL — ABNORMAL HIGH (ref 70–99)

## 2020-09-22 MED ORDER — HYDRALAZINE HCL 25 MG PO TABS
25.0000 mg | ORAL_TABLET | Freq: Once | ORAL | Status: AC
  Administered 2020-09-22: 25 mg via ORAL
  Filled 2020-09-22: qty 1

## 2020-09-22 NOTE — Consult Note (Signed)
Hospital Consult    Reason for Consult: Bleeding from left arm AV fistula Referring Physician: ED MRN #:  [id number]  History of Present Illness: This is a 60 y.o. female with multiple medical problems including end-stage renal disease on hemodialysis, hypertension, hyperlipidemia, diabetes, previous CVA that vascular surgery has been consulted for bleeding from left arm AV fistula.  In review of the records this is actually a basilic vein fistula in the left arm that was placed by myself in 2020.  She had bleeding during dialysis today and a dressing was applied and then she was sent to the ED.  At the time my evaluation bleeding has stopped after the dressing was removed.  This appeared to be needle hole bleeding.  I do not see any overt ulcerations or other thinning skin.  She does have a little area of infiltration in the fistula.  She states the fistula has not been working well and a number of bleeding events.    Past Medical History:  Diagnosis Date  . Ambulates with cane   . Constipation   . Depression   . ESRD (end stage renal disease) ([place])    a. TTS Dialysis.  Marland Kitchen GERD (gastroesophageal reflux disease)   . History of stress test    a. 01/2003 MV: EF 74%, no ischemia/infarct.  . Hyperlipidemia   . Hypertension   . Osteoporosis   . Stroke [facility]) 04-01-11   left frontal subcortical, saw Dr. [name]   . Syncope 11/2019  . TIA (transient ischemic attack) 03-12-11  . Tobacco abuse   . Tricuspid regurgitation    a. 05/2016 Echo: EF 65-70%, Gr2DD, mild MR, nl RV fxn, Triv TR, PASP 2mHg; b. 05/2018 Echo: EF 60-65%, Gr2DD, mild MR/TR, RVSP/PASP 328mg; c. 11/2019 Echo: EF 55-60%, no rwma, mild-mod MR, Sev TR w/ RV dilatation (CTA chest neg for PE).  . Type II diabetes mellitus ([place]  . Vitamin D deficiency     Past Surgical History:  Procedure Laterality Date  . AV FISTULA PLACEMENT Left 02/21/2019   Procedure: BRACHIOCEPHALIC ARTERIOVENOUS (AV) FISTULA CREATION;  Surgeon: ClMarty HeckMD;  [place] [facility] Service: Vascular;  Laterality: Left;  . BASCILIC VEIN TRANSPOSITION Left 04/11/2019   Procedure: SECOND STAGE BASILIC VEIN TRANSPOSITION LEFT ARM;  Surgeon: ClMarty HeckMD;  [place] [facility]: Vascular;  Laterality: Left;  . IR FLUORO GUIDE CV LINE RIGHT  04/29/2019  . IR [place] VASC ACCESS RIGHT  04/29/2019  . OPEN REDUCTION INTERNAL FIXATION (ORIF) DISTAL RADIAL FRACTURE Left 01/28/2016   Procedure: OPEN REDUCTION INTERNAL FIXATION (ORIF) DISTAL RADIAL FRACTURE;  Surgeon: FrIran PlanasMD;  Location: [facility] Service: Orthopedics;  Laterality: Left;  . RE[place]ATH N/A 12/05/2019   Procedure: RIGHT HEART CATH;  Surgeon: EnNelva BushMD;  [place] [place] [place];  Service: Cardiovascular;  Laterality: N/A;  . TONSILLECTOMY AND ADENOIDECTOMY     age 89  Allergies  Allergen Reactions  . Hydrocodone Nausea And Vomiting and Other (See Comments)    [facility]  Prior to Admission medications   Medication Sig Start Date End Date Taking? Authorizing Provider  Amino Acids-Protein Hydrolys (FEEDING SUPPLEMENT, PRO-STAT SUGAR FREE 64,) LIQD Take 30 mLs by mouth in the morning. WILD CHERRY FLAVOR    [provider]  aspirin 81 MG chewable tablet Chew 81 mg by mouth daily.  [provider]  atorvastatin (LIPITOR) 40 MG tablet Take 40 mg by mouth at bedtime.     [provider]  famotidine (PEPCID) 10 MG tablet Take 10 mg by mouth in the morning.    [provider]  FLUoxetine (PROZAC) 10 MG capsule Take 10 mg by mouth daily.    [provider]  HUMALOG 100 UNIT/ML injection Inject 0.05 mLs (5 Units total) into the skin 3 (three) times daily with meals. 08/23/20   Madalyn Rob, MD  metoprolol tartrate (LOPRESSOR) 100 MG tablet Take 100 mg by mouth 2 (two) times daily.    [provider]  Multiple Vitamins-Minerals (CENTRUM  SILVER 50+WOMEN) TABS Take 1 tablet by mouth in the morning.    [provider]  omega-3 acid ethyl esters (LOVAZA) 1 g capsule Take 2 capsules by mouth 2 (two) times daily. 05/28/20   [provider]  omeprazole (PRILOSEC) 40 MG capsule Take 40 mg by mouth daily before breakfast.     [provider]  polyethylene glycol powder (GLYCOLAX/MIRALAX) 17 GM/SCOOP powder Take 17 g by mouth See admin instructions. Mix 17 grams of powder into 8 ounces of fluid, stir, and drink (by mouth) once a day    [provider]  SACCHAROMYCES BOULARDII PO Take 1 capsule by mouth 2 (two) times daily.    [provider]  senna-docusate (SENOKOT-S) 8.6-50 MG tablet Take 1 tablet by mouth at bedtime as needed for mild constipation. 06/11/18   Regalado, [name], MD  sevelamer carbonate (RENVELA) 800 MG tablet Take 800 mg by mouth 3 (three) times daily with meals.    [provider]  Vitamin D, Ergocalciferol, (DRISDOL) 50000 units CAPS capsule Take 1 capsule (50,000 Units total) by mouth every 7 (seven) days. 11/21/16   Ghimire, Henreitta Leber, MD    Social History   Socioeconomic History  . Marital status: Single    Spouse name: Not on file  . Number of children: Not on file  . Years of education: Not on file  . Highest education level: Not on file  Occupational History  . Not on file  Tobacco Use  . Smoking status: Current Every Day Smoker    Packs/day: 0.30    Years: 32.00    Pack years: 9.60    Types: Cigarettes    Last attempt to quit: 09/23/2012    Years since quitting: 8.0  . Smokeless tobacco: Never Used  . Tobacco comment: 8 cigarettes/day  Vaping Use  . Vaping Use: Never used  Substance and Sexual Activity  . Alcohol use: No  . Drug use: No  . Sexual activity: Not Currently    Birth control/protection: Post-menopausal  Other Topics Concern  . Not on file  Social History Narrative   Lives at [facility] in [place], Skilled    Right Handed    Drinks 2-4 cups caffeine daily      Social Determinants of Health   Financial Resource Strain: Not on file  Food Insecurity: Not on file  Transportation Needs: Not on file  Physical Activity: Not on file  Stress: Not on file  Social Connections: Not on file  Intimate Partner Violence: Not on file     Family History  Problem Relation Age of Onset  . Stroke Mother   . Diabetes Mother   . Kidney failure Mother   . Heart failure Mother   . Stroke Father   . Cancer Sister        Breast-  50's    ROS: '[x]'$  Positive   '[ ]'$  Negative   '[ ]'$  All sytems reviewed and are negative  Cardiovascular: '[]'$  chest pain/pressure '[]'$  palpitations '[]'$  SOB lying flat '[]'$  DOE '[]'$  pain in legs while walking '[]'$  pain in legs at rest '[]'$  pain in legs at night '[]'$  non-healing ulcers '[]'$  hx of DVT '[]'$  swelling in legs  Pulmonary: '[]'$  productive cough '[]'$  asthma/wheezing '[]'$  home O2  Neurologic: '[]'$  weakness in '[]'$  arms '[]'$  legs '[]'$  numbness in '[]'$  arms '[]'$  legs '[]'$  hx of CVA '[]'$  mini stroke '[]'$ difficulty speaking or slurred speech '[]'$  temporary loss of vision in one eye '[]'$  dizziness  Hematologic: '[]'$  hx of cancer '[]'$  bleeding problems '[]'$  problems with blood clotting easily  Endocrine:   '[]'$  diabetes '[]'$  thyroid disease  GI '[]'$  vomiting blood '[]'$  blood in stool  GU: '[]'$  CKD/renal failure '[]'$  HD--'[]'$  M/W/F or '[]'$  T/T/S '[]'$  burning with urination '[]'$  blood in urine  Psychiatric: '[]'$  anxiety '[]'$  depression  Musculoskeletal: '[]'$  arthritis '[]'$  joint pain  Integumentary: '[]'$  rashes '[]'$  ulcers  Constitutional: '[]'$  fever '[]'$  chills   Physical Examination  Vitals:   09/22/20 1522 09/22/20 1545  BP: (!) 202/85 (!) 206/81  Pulse: (!) 57 (!) 57  Resp: 13 16  Temp: 97.6 F (36.4 C)   SpO2: 97% 96%   Body mass index is 23.63 kg/m.  General:  NAD Gait: Not observed HENT: WNL, normocephalic Pulmonary: normal non-labored breathing Cardiac: regular, without  Murmurs, rubs or gallops Abdomen: soft,  NT/ND Vascular Exam/Pulses: Left upper arm basilic vein fistula that has a small area of infiltration but otherwise no overt ulcerations or thinning skin to suggest source of bleeding No overt bleeding but suspect this is from a needle hole Good thrill by the antecubitum and slightly pulsatile up toward the axilla   CBC    Component Value Date/Time   WBC 6.8 09/22/2020 1521   RBC 2.97 (L) 09/22/2020 1521   HGB 9.4 (L) 09/22/2020 1521   HCT 30.3 (L) 09/22/2020 1521   PLT 93 (L) 09/22/2020 1521   MCV 102.0 (H) 09/22/2020 1521   MCH 31.6 09/22/2020 1521   MCHC 31.0 09/22/2020 1521   RDW 20.2 (H) 09/22/2020 1521   LYMPHSABS 1.0 09/22/2020 1521   MONOABS 0.9 09/22/2020 1521   EOSABS 0.1 09/22/2020 1521   BASOSABS 0.0 09/22/2020 1521    BMET    Component Value Date/Time   NA 133 (L) 09/22/2020 1521   K 3.7 09/22/2020 1521   CL 95 (L) 09/22/2020 1521   CO2 23 09/22/2020 1521   GLUCOSE 126 (H) 09/22/2020 1521   BUN 30 (H) 09/22/2020 1521   CREATININE 4.24 (H) 09/22/2020 1521   CREATININE 0.56 12/20/2012 0906   CALCIUM 8.1 (L) 09/22/2020 1521   CALCIUM 13.2 (HH) 11/09/2016 0719   GFRNONAA 11 (L) 09/22/2020 1521   GFRAA 24 (L) 02/28/2020 2027    COAGS: Lab Results  Component Value Date   INR 1.0 03/29/2020   INR 1.4 (H) 04/28/2019   INR 1.01 11/09/2016     Non-Invasive Vascular Imaging:    None   ASSESSMENT/PLAN: This is a 60 y.o. female with ESRD that presents with bleeding from left upper arm basilic vein fistula from her dialysis center.  On evaluation there is no overt ulceration or thinning skin or scab to warrant immediate revision in the OR.  This appeared to be needle hole bleeding that is now stopped and hemostatic.  There does appear to be  a pretty good thrill near the antecubitum but she states the fistula has not been working well and she has had multiple prior bleeding events recently.  I have recommended fistulogram and will arrange as an outpatient later  this week to ensure there are no other technical issue with the fistula that would explain ongoing bleeding.  The ED is good to check some labs to make sure that her hemoglobin and electrolytes are otherwise stable.  I will have my office contact her later this week to schedule fistulogram.  [name], MD [facility] Office: [place]

## 2020-09-22 NOTE — ED Triage Notes (Signed)
Pt to ED via EMS from Dialysis,c/o bleeding from access site; life side fistula, apparently pt moved when trying to access  after accessing started to bleed, unknown how much blood was lost but EMS was told took large amount and took 2 towels to clean up  . Pt has not had full dialysis treatment since Tuesday. Pt reports [facility]. No medications given by EMS. ej placed by ems. Pt noted to be hypotensive by EMS. Orientation at baseline.  Lowest bp being 70 palp.  Last VS: 90 PALP, HR 60, CBG 250, 97%O2. RR 15

## 2020-09-22 NOTE — Discharge Instructions (Addendum)
Call your primary care doctor or specialist as discussed in the next 2-3 days.   Return immediately back to the ER if:  Your symptoms worsen within the next 12-24 hours. You develop new symptoms such as new fevers, persistent vomiting, new pain, shortness of breath, or new weakness or numbness, or if you have any other concerns.  

## 2020-09-22 NOTE — ED Provider Notes (Signed)
[facility] EMERGENCY DEPARTMENT Provider Note   CSN: [id number] Arrival date & time: 09/22/20  1504     History Chief Complaint  Patient presents with  . Hypotension  . Vascular Access Problem    [name] ALDERFER is a 60 y.o. female.  Patient presents chief complaint of bleeding from dialysis access site in the left arm.  She went to dialysis today and about 1 hour into dialysis they were adjusting her access site when she started bleeding from it.  They stated it was pulsatile bleeding very difficult to control and EMS was called.  EMS placed a clamp on it which resolved the bleeding, and she was brought to the ER.  Per EMS report blood pressure on their arrival was 70 systolic.  No other reports of fevers or cough or vomiting or diarrhea.  Patient states that she is had bleeding from the fistula in the past.        Past Medical History:  Diagnosis Date  . Ambulates with cane   . Constipation   . Depression   . ESRD (end stage renal disease) ([place])    a. TTS Dialysis.  Marland Kitchen GERD (gastroesophageal reflux disease)   . History of stress test    a. 01/2003 MV: EF 74%, no ischemia/infarct.  . Hyperlipidemia   . Hypertension   . Osteoporosis   . Stroke [facility]) 04-01-11   left frontal subcortical, saw Dr. [name]   . Syncope 11/2019  . TIA (transient ischemic attack) 03-12-11  . Tobacco abuse   . Tricuspid regurgitation    a. 05/2016 Echo: EF 65-70%, Gr2DD, mild MR, nl RV fxn, Triv TR, PASP 50mHg; b. 05/2018 Echo: EF 60-65%, Gr2DD, mild MR/TR, RVSP/PASP 366mg; c. 11/2019 Echo: EF 55-60%, no rwma, mild-mod MR, Sev TR w/ RV dilatation (CTA chest neg for PE).  . Type II diabetes mellitus ([place]  . Vitamin D deficiency     Patient Active Problem List   Diagnosis Date Noted  . Altered mental status 08/22/2020  . Acute metabolic encephalopathy 0399991111. Severe tricuspid regurgitation 12/04/2019  . ESRD (end stage renal disease) ([place]  . GERD (gastroesophageal  reflux disease)   . Hypertension   . Type II diabetes mellitus ([place]  . 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 AMS (altered mental status) 04/28/2019  . Hypoxemia 04/28/2019  . Fall 06/09/2018  . Fall at home, initial encounter 06/09/2018  . Anemia of chronic disease 06/09/2018  . Syncope and collapse 06/09/2018  . Acute encephalopathy 03/10/2018  . Hypermagnesemia 03/10/2018  . Acute lower UTI 11/09/2016  . Uncontrolled type 2 diabetes mellitus with complication ([facility]  . 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 associated with diabetes mellitus due to underlying condition ([place]  . Chronic diastolic CHF (congestive heart failure) ([place]  . Proliferative diabetic retinopathy ([place]CF[place]11/12/2015  . Poor social situation 03/09/2013  . Depression 03/01/2013  . Abnormal mammogram 12/20/2012  . Retinal detachment 11/17/2012  . 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 ([id number]. Hyperlipidemia 02/09/2007  . Essential hypertension 02/09/2007    Past Surgical History:  Procedure Laterality Date  . AV FISTULA PLACEMENT Left 02/21/2019   Procedure: BRACHIOCEPHALIC ARTERIOVENOUS (AV) FISTULA CREATION;  Surgeon: ClMarty HeckMD;  [place] Service: Vascular;  Laterality: Left;  . BASCILIC VEIN TRANSPOSITION Left 04/11/2019   Procedure: SECOND STAGE BASILIC VEIN TRANSPOSITION LEFT ARM;  Surgeon: ClMarty Heck  MD;  Location: [place];  Service: Vascular;  Laterality: Left;  . IR FLUORO GUIDE CV LINE RIGHT  04/29/2019  . IR US GUIDE VASC ACCESS RIGHT  04/29/2019  . OPEN REDUCTION INTERNAL FIXATION (ORIF) DISTAL RADIAL FRACTURE Left 01/28/2016   Procedure: OPEN REDUCTION INTERNAL FIXATION (ORIF) DISTAL RADIAL FRACTURE;  Surgeon: Iran Planas, MD;  Location: [place];  Service: Orthopedics;  Laterality: Left;  . [place] CATH N/A 12/05/2019   Procedure: RIGHT HEART CATH;  Surgeon:  Nelva Bush, MD;  Location: [place] CV LAB;  Service: Cardiovascular;  Laterality: N/A;  . TONSILLECTOMY AND ADENOIDECTOMY     age 89     OB History   No obstetric history on file.     Family History  Problem Relation Age of Onset  . Stroke Mother   . Diabetes Mother   . Kidney failure Mother   . Heart failure Mother   . Stroke Father   . Cancer Sister        Breast- 66's    Social History   Tobacco Use  . Smoking status: Current Every Day Smoker    Packs/day: 0.30    Years: 32.00    Pack years: 9.60    Types: Cigarettes    Last attempt to quit: 09/23/2012    Years since quitting: 8.0  . Smokeless tobacco: Never Used  . Tobacco comment: 8 cigarettes/day  Vaping Use  . Vaping Use: Never used  Substance Use Topics  . Alcohol use: No  . Drug use: No    Home Medications Prior to Admission medications   Medication Sig Start Date End Date Taking? Authorizing Provider  acetaminophen (TYLENOL) 325 MG tablet Take 325 mg by mouth every 4 (four) hours as needed for mild pain.   Yes [provider]  aspirin 81 MG chewable tablet Chew 81 mg by mouth daily.   Yes [provider]  Multiple Vitamins-Minerals (CERTAVITE SENIOR) TABS Take 1 tablet by mouth daily.   Yes [provider]  Amino Acids-Protein Hydrolys (FEEDING SUPPLEMENT, PRO-STAT SUGAR FREE 64,) LIQD Take 30 mLs by mouth in the morning. WILD CHERRY FLAVOR    [provider]  atorvastatin (LIPITOR) 40 MG tablet Take 40 mg by mouth at bedtime.     [provider]  famotidine (PEPCID) 10 MG tablet Take 10 mg by mouth in the morning.    [provider]  FLUoxetine (PROZAC) 10 MG capsule Take 10 mg by mouth daily.    [provider]  HUMALOG 100 UNIT/ML injection Inject 0.05 mLs (5 Units total) into the skin 3 (three) times daily with meals. 08/23/20   Madalyn Rob, MD  metoprolol tartrate (LOPRESSOR) 100 MG tablet Take 100 mg by mouth 2 (two) times daily.     [provider]  omega-3 acid ethyl esters (LOVAZA) 1 g capsule Take 2 capsules by mouth 2 (two) times daily. 05/28/20   [provider]  omeprazole (PRILOSEC) 40 MG capsule Take 40 mg by mouth daily before breakfast.     [provider]  polyethylene glycol powder (GLYCOLAX/MIRALAX) 17 GM/SCOOP powder Take 17 g by mouth See admin instructions. Mix 17 grams of powder into 8 ounces of fluid, stir, and drink (by mouth) once a day    [provider]  SACCHAROMYCES BOULARDII PO Take 1 capsule by mouth 2 (two) times daily.    [provider]  senna-docusate (SENOKOT-S) 8.6-50  MG tablet Take 1 tablet by mouth at bedtime as needed for mild constipation. 06/11/18   Regalado, [name], MD  sevelamer carbonate (RENVELA) 800 MG tablet Take 800 mg by mouth 3 (three) times daily with meals.    [provider]  Vitamin D, Ergocalciferol, (DRISDOL) 50000 units CAPS capsule Take 1 capsule (50,000 Units total) by mouth every 7 (seven) days. 11/21/16   Ghimire, Henreitta Leber, MD    Allergies    Hydrocodone  Review of Systems   Review of Systems  Constitutional: Negative for fever.  HENT: Negative for ear pain.   Eyes: Negative for pain.  Respiratory: Negative for cough.   Cardiovascular: Negative for chest pain.  Gastrointestinal: Negative for abdominal pain.  Genitourinary: Negative for flank pain.  Musculoskeletal: Negative for back pain.  Skin: Negative for rash.  Neurological: Negative for headaches.    Physical Exam Updated Vital Signs BP (!) 208/70   Pulse (!) 59   Temp 97.6 F (36.4 C) (Oral)   Resp 16   Ht '4\' 6"'$  (1.372 m)   Wt 44.5 kg   LMP 03/06/2013 (LMP Unknown)   SpO2 95%   BMI 23.63 kg/m   Physical Exam Constitutional:      General: She is not in acute distress.    Appearance: Normal appearance.  HENT:     Head: Normocephalic.     Nose: Nose normal.  Eyes:     Extraocular Movements: Extraocular movements intact.   Cardiovascular:     Rate and Rhythm: Normal rate.  Pulmonary:     Effort: Pulmonary effort is normal.  Musculoskeletal:        General: Normal range of motion.     Cervical back: Normal range of motion.  Skin:    Comments: Left upper extremity clamp left in place for 30 more minutes.  Upon removal the skin appears intact no active bleeding seen no ulcers seen.  Palpable thrill present.  Neurological:     General: No focal deficit present.     Mental Status: She is alert. Mental status is at baseline.     ED Results / Procedures / Treatments   Labs (all labs ordered are listed, but only abnormal results are displayed) Labs Reviewed  BASIC METABOLIC PANEL - Abnormal; Notable for the following components:      Result Value   Sodium 133 (*)    Chloride 95 (*)    Glucose, Bld 126 (*)    BUN 30 (*)    Creatinine, Ser 4.24 (*)    Calcium 8.1 (*)    GFR, Estimated 11 (*)    All other components within normal limits  CBC WITH DIFFERENTIAL/PLATELET - Abnormal; Notable for the following components:   RBC 2.97 (*)    Hemoglobin 9.4 (*)    HCT 30.3 (*)    MCV 102.0 (*)    RDW 20.2 (*)    Platelets 93 (*)    nRBC 0.3 (*)    All other components within normal limits  CBG MONITORING, ED - Abnormal; Notable for the following components:   Glucose-Capillary 123 (*)    All other components within normal limits  RESP PANEL BY RT-PCR (FLU A&B, COVID) [id number]    EKG EKG Interpretation  Date/Time:  Saturday September 22 2020 15:12:16 EDT Ventricular Rate:  58 PR Interval:  124 QRS Duration: 153 QT Interval:  530 QTC Calculation: 521 R Axis:   128 Text Interpretation: Sinus rhythm RBBB and LPFB Repol abnrm suggests ischemia, anterolateral  Confirmed by Thamas Jaegers ([id number]) on 09/22/2020 3:18:48 PM   Radiology No results found.  Procedures Procedures   Medications Ordered in ED Medications  hydrALAZINE (APRESOLINE) tablet 25 mg (25 mg Oral Given 09/22/20 1649)    ED Course  I have  reviewed the triage vital signs and the nursing notes.  Pertinent labs & imaging results that were available during my care of the patient were reviewed by me and considered in my medical decision making (see chart for details).    MDM Rules/Calculators/A&P                          Case discussed with vascular surgery, recommending outpatient fistulogram within the week.  Patient is here for several hours with no additional bleeding.  Will be discharged home in stable condition.  She is noted to be hypertensive and given hydralazine prior to discharge.  Final Clinical Impression(s) / ED Diagnoses Final diagnoses:  Bleeding from dialysis shunt, initial encounter [facility])    Rx / DC Orders ED Discharge Orders    None       Luna Fuse, MD 09/22/20 1734

## 2020-09-22 NOTE — ED Notes (Signed)
[facility] Called for transport 

## 2020-09-24 ENCOUNTER — Telehealth: Payer: Self-pay

## 2020-09-24 NOTE — Telephone Encounter (Signed)
Spoke with Ranita, nurse at [facility] in attempts to schedule pt for left arm fistulogram with Dr. Monica Martinez on 09/27/20. Nurse informed that pt has already been scheduled for this procedure on 4/6 with [facility].

## 2020-10-05 ENCOUNTER — Inpatient Hospital Stay (HOSPITAL_COMMUNITY): Payer: Medicare Other

## 2020-10-05 ENCOUNTER — Emergency Department (HOSPITAL_COMMUNITY): Payer: Medicare Other

## 2020-10-05 ENCOUNTER — Other Ambulatory Visit: Payer: Self-pay

## 2020-10-05 ENCOUNTER — Encounter (HOSPITAL_COMMUNITY): Payer: Self-pay

## 2020-10-05 ENCOUNTER — Inpatient Hospital Stay (HOSPITAL_COMMUNITY)
Admission: EM | Admit: 2020-10-05 | Discharge: 2020-10-09 | DRG: 391 | Disposition: A | Discharge status: Skilled Nursing Facility | Payer: Medicare Other | Attending: Family Medicine | Admitting: Family Medicine

## 2020-10-05 DIAGNOSIS — E872 Acidosis, unspecified: Secondary | ICD-10-CM | POA: Diagnosis present

## 2020-10-05 DIAGNOSIS — I081 Rheumatic disorders of both mitral and tricuspid valves: Secondary | ICD-10-CM | POA: Diagnosis present

## 2020-10-05 DIAGNOSIS — R296 Repeated falls: Secondary | ICD-10-CM | POA: Diagnosis present

## 2020-10-05 DIAGNOSIS — Z20822 Contact with and (suspected) exposure to covid-19: Secondary | ICD-10-CM | POA: Diagnosis present

## 2020-10-05 DIAGNOSIS — Z809 Family history of malignant neoplasm, unspecified: Secondary | ICD-10-CM

## 2020-10-05 DIAGNOSIS — D638 Anemia in other chronic diseases classified elsewhere: Secondary | ICD-10-CM | POA: Diagnosis present

## 2020-10-05 DIAGNOSIS — Z66 Do not resuscitate: Secondary | ICD-10-CM | POA: Diagnosis present

## 2020-10-05 DIAGNOSIS — E875 Hyperkalemia: Secondary | ICD-10-CM | POA: Diagnosis present

## 2020-10-05 DIAGNOSIS — R0902 Hypoxemia: Secondary | ICD-10-CM

## 2020-10-05 DIAGNOSIS — I517 Cardiomegaly: Secondary | ICD-10-CM

## 2020-10-05 DIAGNOSIS — R7989 Other specified abnormal findings of blood chemistry: Secondary | ICD-10-CM

## 2020-10-05 DIAGNOSIS — Z9181 History of falling: Secondary | ICD-10-CM

## 2020-10-05 DIAGNOSIS — E559 Vitamin D deficiency, unspecified: Secondary | ICD-10-CM | POA: Diagnosis present

## 2020-10-05 DIAGNOSIS — F17210 Nicotine dependence, cigarettes, uncomplicated: Secondary | ICD-10-CM | POA: Diagnosis present

## 2020-10-05 DIAGNOSIS — Z794 Long term (current) use of insulin: Secondary | ICD-10-CM

## 2020-10-05 DIAGNOSIS — A09 Infectious gastroenteritis and colitis, unspecified: Secondary | ICD-10-CM | POA: Diagnosis present

## 2020-10-05 DIAGNOSIS — A419 Sepsis, unspecified organism: Secondary | ICD-10-CM | POA: Diagnosis not present

## 2020-10-05 DIAGNOSIS — I340 Nonrheumatic mitral (valve) insufficiency: Secondary | ICD-10-CM

## 2020-10-05 DIAGNOSIS — Z79899 Other long term (current) drug therapy: Secondary | ICD-10-CM

## 2020-10-05 DIAGNOSIS — Z885 Allergy status to narcotic agent status: Secondary | ICD-10-CM

## 2020-10-05 DIAGNOSIS — K219 Gastro-esophageal reflux disease without esophagitis: Secondary | ICD-10-CM | POA: Diagnosis present

## 2020-10-05 DIAGNOSIS — E1122 Type 2 diabetes mellitus with diabetic chronic kidney disease: Secondary | ICD-10-CM | POA: Diagnosis present

## 2020-10-05 DIAGNOSIS — D696 Thrombocytopenia, unspecified: Secondary | ICD-10-CM | POA: Diagnosis present

## 2020-10-05 DIAGNOSIS — R6520 Severe sepsis without septic shock: Secondary | ICD-10-CM | POA: Diagnosis present

## 2020-10-05 DIAGNOSIS — N186 End stage renal disease: Secondary | ICD-10-CM | POA: Diagnosis present

## 2020-10-05 DIAGNOSIS — Z8249 Family history of ischemic heart disease and other diseases of the circulatory system: Secondary | ICD-10-CM

## 2020-10-05 DIAGNOSIS — I361 Nonrheumatic tricuspid (valve) insufficiency: Secondary | ICD-10-CM

## 2020-10-05 DIAGNOSIS — Z8673 Personal history of transient ischemic attack (TIA), and cerebral infarction without residual deficits: Secondary | ICD-10-CM

## 2020-10-05 DIAGNOSIS — F32A Depression, unspecified: Secondary | ICD-10-CM | POA: Diagnosis present

## 2020-10-05 DIAGNOSIS — Z7189 Other specified counseling: Secondary | ICD-10-CM | POA: Diagnosis not present

## 2020-10-05 DIAGNOSIS — E1165 Type 2 diabetes mellitus with hyperglycemia: Secondary | ICD-10-CM | POA: Diagnosis present

## 2020-10-05 DIAGNOSIS — I071 Rheumatic tricuspid insufficiency: Secondary | ICD-10-CM | POA: Diagnosis not present

## 2020-10-05 DIAGNOSIS — D631 Anemia in chronic kidney disease: Secondary | ICD-10-CM | POA: Diagnosis present

## 2020-10-05 DIAGNOSIS — R17 Unspecified jaundice: Secondary | ICD-10-CM | POA: Diagnosis not present

## 2020-10-05 DIAGNOSIS — R109 Unspecified abdominal pain: Secondary | ICD-10-CM

## 2020-10-05 DIAGNOSIS — E785 Hyperlipidemia, unspecified: Secondary | ICD-10-CM | POA: Diagnosis present

## 2020-10-05 DIAGNOSIS — I5033 Acute on chronic diastolic (congestive) heart failure: Secondary | ICD-10-CM | POA: Diagnosis present

## 2020-10-05 DIAGNOSIS — G9341 Metabolic encephalopathy: Secondary | ICD-10-CM | POA: Diagnosis present

## 2020-10-05 DIAGNOSIS — J9601 Acute respiratory failure with hypoxia: Secondary | ICD-10-CM | POA: Diagnosis present

## 2020-10-05 DIAGNOSIS — E782 Mixed hyperlipidemia: Secondary | ICD-10-CM

## 2020-10-05 DIAGNOSIS — Z841 Family history of disorders of kidney and ureter: Secondary | ICD-10-CM

## 2020-10-05 DIAGNOSIS — Z833 Family history of diabetes mellitus: Secondary | ICD-10-CM

## 2020-10-05 DIAGNOSIS — Z992 Dependence on renal dialysis: Secondary | ICD-10-CM

## 2020-10-05 DIAGNOSIS — N2581 Secondary hyperparathyroidism of renal origin: Secondary | ICD-10-CM | POA: Diagnosis present

## 2020-10-05 DIAGNOSIS — R7401 Elevation of levels of liver transaminase levels: Secondary | ICD-10-CM

## 2020-10-05 DIAGNOSIS — M810 Age-related osteoporosis without current pathological fracture: Secondary | ICD-10-CM | POA: Diagnosis present

## 2020-10-05 DIAGNOSIS — Z7982 Long term (current) use of aspirin: Secondary | ICD-10-CM

## 2020-10-05 DIAGNOSIS — I10 Essential (primary) hypertension: Secondary | ICD-10-CM | POA: Diagnosis present

## 2020-10-05 DIAGNOSIS — I132 Hypertensive heart and chronic kidney disease with heart failure and with stage 5 chronic kidney disease, or end stage renal disease: Secondary | ICD-10-CM | POA: Diagnosis present

## 2020-10-05 DIAGNOSIS — I5032 Chronic diastolic (congestive) heart failure: Secondary | ICD-10-CM | POA: Diagnosis present

## 2020-10-05 DIAGNOSIS — Z6821 Body mass index (BMI) 21.0-21.9, adult: Secondary | ICD-10-CM

## 2020-10-05 DIAGNOSIS — R1011 Right upper quadrant pain: Secondary | ICD-10-CM

## 2020-10-05 DIAGNOSIS — Z9115 Patient's noncompliance with renal dialysis: Secondary | ICD-10-CM

## 2020-10-05 DIAGNOSIS — Z515 Encounter for palliative care: Secondary | ICD-10-CM | POA: Diagnosis not present

## 2020-10-05 DIAGNOSIS — Z823 Family history of stroke: Secondary | ICD-10-CM

## 2020-10-05 DIAGNOSIS — R627 Adult failure to thrive: Secondary | ICD-10-CM | POA: Diagnosis present

## 2020-10-05 DIAGNOSIS — E119 Type 2 diabetes mellitus without complications: Secondary | ICD-10-CM

## 2020-10-05 DIAGNOSIS — H0520 Unspecified exophthalmos: Secondary | ICD-10-CM | POA: Diagnosis present

## 2020-10-05 DIAGNOSIS — I160 Hypertensive urgency: Secondary | ICD-10-CM | POA: Diagnosis present

## 2020-10-05 DIAGNOSIS — E876 Hypokalemia: Secondary | ICD-10-CM | POA: Diagnosis not present

## 2020-10-05 LAB — RESP PANEL BY RT-PCR (FLU A&B, COVID) ARPGX2
Influenza A by PCR: NEGATIVE
Influenza B by PCR: NEGATIVE
SARS Coronavirus 2 by RT PCR: NEGATIVE

## 2020-10-05 LAB — APTT: aPTT: 39 seconds — ABNORMAL HIGH (ref 24–36)

## 2020-10-05 LAB — I-STAT VENOUS BLOOD GAS, ED
Acid-Base Excess: 1 mmol/L (ref 0.0–2.0)
Bicarbonate: 27.3 mmol/L (ref 20.0–28.0)
Calcium, Ion: 1.09 mmol/L — ABNORMAL LOW (ref 1.15–1.40)
HCT: 43 % (ref 36.0–46.0)
Hemoglobin: 14.6 g/dL (ref 12.0–15.0)
O2 Saturation: 55 %
Potassium: 5.3 mmol/L — ABNORMAL HIGH (ref 3.5–5.1)
Sodium: 139 mmol/L (ref 135–145)
TCO2: 29 mmol/L (ref 22–32)
pCO2, Ven: 47.8 mmHg (ref 44.0–60.0)
pH, Ven: 7.364 (ref 7.250–7.430)
pO2, Ven: 30 mmHg — CL (ref 32.0–45.0)

## 2020-10-05 LAB — CBC WITH DIFFERENTIAL/PLATELET
Abs Immature Granulocytes: 0.05 10*3/uL (ref 0.00–0.07)
Basophils Absolute: 0 10*3/uL (ref 0.0–0.1)
Basophils Relative: 1 %
Eosinophils Absolute: 0 10*3/uL (ref 0.0–0.5)
Eosinophils Relative: 0 %
HCT: 39.5 % (ref 36.0–46.0)
Hemoglobin: 11.6 g/dL — ABNORMAL LOW (ref 12.0–15.0)
Immature Granulocytes: 1 %
Lymphocytes Relative: 13 %
Lymphs Abs: 1.1 10*3/uL (ref 0.7–4.0)
MCH: 30.6 pg (ref 26.0–34.0)
MCHC: 29.4 g/dL — ABNORMAL LOW (ref 30.0–36.0)
MCV: 104.2 fL — ABNORMAL HIGH (ref 80.0–100.0)
Monocytes Absolute: 0.8 10*3/uL (ref 0.1–1.0)
Monocytes Relative: 10 %
Neutro Abs: 6.3 10*3/uL (ref 1.7–7.7)
Neutrophils Relative %: 75 %
Platelets: 72 10*3/uL — ABNORMAL LOW (ref 150–400)
RBC: 3.79 MIL/uL — ABNORMAL LOW (ref 3.87–5.11)
RDW: 19.8 % — ABNORMAL HIGH (ref 11.5–15.5)
WBC: 8.3 10*3/uL (ref 4.0–10.5)
nRBC: 1.8 % — ABNORMAL HIGH (ref 0.0–0.2)

## 2020-10-05 LAB — ECHOCARDIOGRAM COMPLETE
Area-P 1/2: 4.6 cm2
S' Lateral: 3.3 cm
Single Plane A4C EF: 44.6 %

## 2020-10-05 LAB — MAGNESIUM: Magnesium: 2.6 mg/dL — ABNORMAL HIGH (ref 1.7–2.4)

## 2020-10-05 LAB — I-STAT CHEM 8, ED
BUN: 61 mg/dL — ABNORMAL HIGH (ref 6–20)
Calcium, Ion: 1.11 mmol/L — ABNORMAL LOW (ref 1.15–1.40)
Chloride: 101 mmol/L (ref 98–111)
Creatinine, Ser: 7.6 mg/dL — ABNORMAL HIGH (ref 0.44–1.00)
Glucose, Bld: 143 mg/dL — ABNORMAL HIGH (ref 70–99)
HCT: 43 % (ref 36.0–46.0)
Hemoglobin: 14.6 g/dL (ref 12.0–15.0)
Potassium: 5.2 mmol/L — ABNORMAL HIGH (ref 3.5–5.1)
Sodium: 140 mmol/L (ref 135–145)
TCO2: 26 mmol/L (ref 22–32)

## 2020-10-05 LAB — COMPREHENSIVE METABOLIC PANEL
ALT: 84 U/L — ABNORMAL HIGH (ref 0–44)
AST: 135 U/L — ABNORMAL HIGH (ref 15–41)
Albumin: 2.9 g/dL — ABNORMAL LOW (ref 3.5–5.0)
Alkaline Phosphatase: 90 U/L (ref 38–126)
Anion gap: 19 — ABNORMAL HIGH (ref 5–15)
BUN: 48 mg/dL — ABNORMAL HIGH (ref 6–20)
CO2: 22 mmol/L (ref 22–32)
Calcium: 9.1 mg/dL (ref 8.9–10.3)
Chloride: 100 mmol/L (ref 98–111)
Creatinine, Ser: 8 mg/dL — ABNORMAL HIGH (ref 0.44–1.00)
GFR, Estimated: 5 mL/min — ABNORMAL LOW (ref >60)
Glucose, Bld: 160 mg/dL — ABNORMAL HIGH (ref 70–99)
Potassium: 5 mmol/L (ref 3.5–5.1)
Sodium: 141 mmol/L (ref 135–145)
Total Bilirubin: 2.2 mg/dL — ABNORMAL HIGH (ref 0.3–1.2)
Total Protein: 6.3 g/dL — ABNORMAL LOW (ref 6.5–8.1)

## 2020-10-05 LAB — LIPASE, BLOOD: Lipase: 44 U/L (ref 11–51)

## 2020-10-05 LAB — PROTIME-INR
INR: 2.1 — ABNORMAL HIGH (ref 0.8–1.2)
Prothrombin Time: 23.1 seconds — ABNORMAL HIGH (ref 11.4–15.2)

## 2020-10-05 LAB — LACTIC ACID, PLASMA
Lactic Acid, Venous: 5.3 mmol/L (ref 0.5–1.9)
Lactic Acid, Venous: 2.5 mmol/L (ref 0.5–1.9)

## 2020-10-05 LAB — GLUCOSE, CAPILLARY: Glucose-Capillary: 116 mg/dL — ABNORMAL HIGH (ref 70–99)

## 2020-10-05 LAB — SAVE SMEAR(SSMR), FOR PROVIDER SLIDE REVIEW

## 2020-10-05 LAB — TSH: TSH: 15.703 u[IU]/mL — ABNORMAL HIGH (ref 0.350–4.500)

## 2020-10-05 MED ORDER — ONDANSETRON HCL 4 MG PO TABS: 4.0000 mg | ORAL_TABLET | Freq: Four times a day (QID) | ORAL | Status: DC | PRN

## 2020-10-05 MED ORDER — ONDANSETRON HCL 4 MG/2ML IJ SOLN: 4.0000 mg | Freq: Four times a day (QID) | INTRAMUSCULAR | Status: DC | PRN

## 2020-10-05 MED ORDER — METRONIDAZOLE IN NACL 5-0.79 MG/ML-% IV SOLN
500.0000 mg | Freq: Once | INTRAVENOUS | Status: AC
  Administered 2020-10-05: 500 mg via INTRAVENOUS
  Filled 2020-10-05: qty 100

## 2020-10-05 MED ORDER — CARVEDILOL 3.125 MG PO TABS
3.1250 mg | ORAL_TABLET | Freq: Two times a day (BID) | ORAL | Status: DC
  Administered 2020-10-06 (×2): 3.125 mg via ORAL
  Filled 2020-10-05 (×2): qty 1

## 2020-10-05 MED ORDER — ADULT MULTIVITAMIN W/MINERALS CH
1.0000 | ORAL_TABLET | Freq: Every day | ORAL | Status: DC
  Administered 2020-10-06 – 2020-10-09 (×4): 1 via ORAL
  Filled 2020-10-05 (×4): qty 1

## 2020-10-05 MED ORDER — CLONIDINE HCL 0.1 MG PO TABS: 0.1000 mg | ORAL_TABLET | Freq: Three times a day (TID) | ORAL | Status: DC | PRN

## 2020-10-05 MED ORDER — ALTEPLASE 2 MG IJ SOLR
2.0000 mg | Freq: Once | INTRAMUSCULAR | Status: DC | PRN
  Filled 2020-10-05: qty 2

## 2020-10-05 MED ORDER — FLUOXETINE HCL 20 MG PO CAPS
20.0000 mg | ORAL_CAPSULE | Freq: Every day | ORAL | Status: DC
  Administered 2020-10-06 – 2020-10-09 (×4): 20 mg via ORAL
  Filled 2020-10-05 (×4): qty 1

## 2020-10-05 MED ORDER — VANCOMYCIN HCL 1000 MG/200ML IV SOLN
1000.0000 mg | Freq: Once | INTRAVENOUS | Status: AC
  Administered 2020-10-06: 1000 mg via INTRAVENOUS
  Filled 2020-10-05 (×2): qty 200

## 2020-10-05 MED ORDER — SENNOSIDES-DOCUSATE SODIUM 8.6-50 MG PO TABS
1.0000 | ORAL_TABLET | Freq: Every evening | ORAL | Status: DC | PRN
  Administered 2020-10-09: 1 via ORAL
  Filled 2020-10-05: qty 1

## 2020-10-05 MED ORDER — METRONIDAZOLE IN NACL 5-0.79 MG/ML-% IV SOLN
500.0000 mg | Freq: Three times a day (TID) | INTRAVENOUS | Status: DC
  Administered 2020-10-06 – 2020-10-09 (×10): 500 mg via INTRAVENOUS
  Filled 2020-10-05 (×10): qty 100

## 2020-10-05 MED ORDER — FLUOXETINE HCL 20 MG PO TABS: 20.0000 mg | ORAL_TABLET | Freq: Every day | ORAL | Status: DC

## 2020-10-05 MED ORDER — SODIUM CHLORIDE 0.9 % IV SOLN: 100.0000 mL | INTRAVENOUS | Status: DC | PRN

## 2020-10-05 MED ORDER — VANCOMYCIN HCL 1000 MG/200ML IV SOLN: 1000.0000 mg | Freq: Once | INTRAVENOUS | Status: DC

## 2020-10-05 MED ORDER — SEVELAMER CARBONATE 800 MG PO TABS
800.0000 mg | ORAL_TABLET | Freq: Three times a day (TID) | ORAL | Status: DC
  Administered 2020-10-06 – 2020-10-09 (×10): 800 mg via ORAL
  Filled 2020-10-05 (×12): qty 1

## 2020-10-05 MED ORDER — HEPARIN SODIUM (PORCINE) 1000 UNIT/ML DIALYSIS
1000.0000 [IU] | INTRAMUSCULAR | Status: DC | PRN
  Filled 2020-10-05: qty 1

## 2020-10-05 MED ORDER — CHLORHEXIDINE GLUCONATE CLOTH 2 % EX PADS
6.0000 | MEDICATED_PAD | Freq: Every day | CUTANEOUS | Status: DC
  Administered 2020-10-06: 6 via TOPICAL

## 2020-10-05 MED ORDER — LIDOCAINE-PRILOCAINE 2.5-2.5 % EX CREA
1.0000 "application " | TOPICAL_CREAM | CUTANEOUS | Status: DC | PRN
  Filled 2020-10-05: qty 5

## 2020-10-05 MED ORDER — SODIUM CHLORIDE 0.9 % IV SOLN: 2.0000 g | Freq: Once | INTRAVENOUS | Status: DC

## 2020-10-05 MED ORDER — SODIUM CHLORIDE 0.9 % IV SOLN
1.0000 g | INTRAVENOUS | Status: DC
  Administered 2020-10-05 – 2020-10-07 (×3): 1 g via INTRAVENOUS
  Filled 2020-10-05 (×4): qty 1

## 2020-10-05 MED ORDER — PANTOPRAZOLE SODIUM 40 MG PO TBEC
40.0000 mg | DELAYED_RELEASE_TABLET | Freq: Every day | ORAL | Status: DC
  Administered 2020-10-06 – 2020-10-09 (×4): 40 mg via ORAL
  Filled 2020-10-05 (×5): qty 1

## 2020-10-05 MED ORDER — INSULIN ASPART 100 UNIT/ML ~~LOC~~ SOLN
0.0000 [IU] | Freq: Three times a day (TID) | SUBCUTANEOUS | Status: DC
  Administered 2020-10-06: 1 [IU] via SUBCUTANEOUS
  Administered 2020-10-07: 2 [IU] via SUBCUTANEOUS
  Administered 2020-10-08: 3 [IU] via SUBCUTANEOUS

## 2020-10-05 MED ORDER — HEPARIN SODIUM (PORCINE) 1000 UNIT/ML DIALYSIS: 2000.0000 [IU] | INTRAMUSCULAR | Status: DC | PRN

## 2020-10-05 MED ORDER — PENTAFLUOROPROP-TETRAFLUOROETH EX AERO
1.0000 "application " | INHALATION_SPRAY | CUTANEOUS | Status: DC | PRN
  Filled 2020-10-05: qty 116

## 2020-10-05 MED ORDER — LIDOCAINE HCL (PF) 1 % IJ SOLN: 5.0000 mL | INTRAMUSCULAR | Status: DC | PRN

## 2020-10-05 MED ORDER — VANCOMYCIN VARIABLE DOSE PER UNSTABLE RENAL FUNCTION (PHARMACIST DOSING): Status: DC

## 2020-10-05 MED ORDER — FAMOTIDINE 20 MG PO TABS
10.0000 mg | ORAL_TABLET | Freq: Every morning | ORAL | Status: DC
  Administered 2020-10-06 – 2020-10-09 (×4): 10 mg via ORAL
  Filled 2020-10-05 (×3): qty 1

## 2020-10-05 MED ORDER — HYDRALAZINE HCL 20 MG/ML IJ SOLN
5.0000 mg | Freq: Four times a day (QID) | INTRAMUSCULAR | Status: DC | PRN
  Administered 2020-10-06 – 2020-10-07 (×2): 5 mg via INTRAVENOUS
  Filled 2020-10-05 (×2): qty 1

## 2020-10-05 MED ORDER — MIDODRINE HCL 5 MG PO TABS
10.0000 mg | ORAL_TABLET | ORAL | Status: DC
  Administered 2020-10-06 – 2020-10-09 (×2): 10 mg via ORAL
  Filled 2020-10-05 (×3): qty 2

## 2020-10-05 MED ORDER — IOHEXOL 300 MG/ML  SOLN
100.0000 mL | Freq: Once | INTRAMUSCULAR | Status: AC | PRN
  Administered 2020-10-05: 95 mL via INTRAVENOUS

## 2020-10-05 MED ORDER — POLYETHYLENE GLYCOL 3350 17 G PO PACK
17.0000 g | PACK | Freq: Every day | ORAL | Status: DC
  Administered 2020-10-08: 17 g via ORAL
  Filled 2020-10-05: qty 1

## 2020-10-05 NOTE — ED Notes (Signed)
Urine/Culture has not been sent yet. Accidentally clicked off.

## 2020-10-05 NOTE — Consult Note (Signed)
ESRD Consult Note [facility]  Requesting provider: Desiree Hane, MD  Outpatient dialysis unit: [facility] Outpatient dialysis schedule: TTS  Assessment/Recommendations:   ESRD: Outpatient HD orders: 3hrs 96mn, F180, 350/500, 3K/2.5Ca. Heparin 2k bolus. Will continue this -Plan for HD later tonight (has been without adequate dialysis for some time) and again tomorrow to keep her on TTS schedule  Weakness -etiology unclear, CTH per primary  Encephalopathy -possibly related to inadequate HD vs a possible sepsis picture  Lactic acidosis, rectal pain -etiology is unclear at the moment. Would be okay with a ct abd/pelvis with contrast to rule out mesenteric ischemia given her symptoms/lactic acidosis. I did talk to [name] about this given the fact that she still makes urine, she agrees to move forward  Transaminitis -statin on hold -CT today, possible RUQ u/s thereafter  Hyperkalemia -k 5.2, HD today  Volume/ hypertension: EDW 43kg. Attempt to achieve EDW as tolerated. Resume home anti-HTNs  Anemia of Chronic Kidney Disease: Hemoglobin 14.6 currently (likely hemoconcentrated). Receiving Mircera 544m, last dose 4/12. Dose recently increased to 7560mto start on 4/19. Hold ESA for now. No need for iron.  Secondary Hyperparathyroidism/Hyperphosphatemia: monitor phos, hectorol 5mc57mtreatment, resume home binders  Vascular access: [name] AVF strong +b/t  DM2 with hyperglycemia -mgmt per primary service  Additional recommendations: - Dose all meds for creatinine clearance < 10 ml/min  - Unless absolutely necessary, no MRIs with gadolinium.  - Implement save arm precautions.  Prefer needle sticks in the dorsum of the hands or wrists.  No blood pressure measurements in arm. - If blood transfusion is requested during hemodialysis sessions, please alert us p[place]or to the session.  - If a hemodialysis catheter line culture is requested, please alert us a[place]oreaonly  hemodialysis nurses are able to collect those specimens.   Recommendations were discussed with the primary team.  [facility]an [facility]   History of Present Illness: [name] ALDANAa/an 60 y11. female with a past medical history of ESRD, hypertension, history of CVA, DM2, CHF, depression, chronic pain, hyperlipidemia who presents with weakness, there was concerns for access issues as well. She also has AMS on presentation and is slow to answer and is not speaking much (only when prompted with questions). At her HD unit, she has been more awake than this. [facility] and requiring O2 at her facility. She was found to be afebrile, no initial O2 sat was 83% requiring 15 L oxygen and is now on 3 L maintaining sats of 93%.  Her initial blood pressure was 206/81 which is also improved down to 156/70. She is currently on vanc, cefepime, and flagyl. UA pending. CXR did not show any definitive infiltrate or pulmonary edema per report.  Last HD was on 4/12, patient signed off early due to rectal pain. Apparently, her rectal pain is an ongoing issue and was just referred to GI on 4/12.  She reports that she still makes urine.  When prompted, she said no to chest pain, SOB, abdominal pain.    Medications:  Current Facility-Administered Medications  Medication Dose Route Frequency Provider Last Rate Last Admin  . 0.9 %  sodium chloride infusion  100 mL Intravenous PRN AndeTobie PoetNP      . 0.9 %  sodium chloride infusion  100 mL Intravenous PRN AndeTobie PoetNP      . alteplase (CATHFLO ACTIVASE) injection 2 mg  2 mg Intracatheter Once PRN Tobie Poet E, NP      . ceFEPIme (MAXIPIME) 1 g in sodium chloride 0.9 % 100 mL IVPB  1 g Intravenous Q24H Thea Gist, RPH      . [START ON 10/06/2020] Chlorhexidine Gluconate Cloth 2 % PADS 6 each  6 each Topical Q0600 Adelfa Koh, NP      .  heparin injection 1,000 Units  1,000 Units Dialysis PRN Adelfa Koh, NP      . lidocaine (PF) (XYLOCAINE) 1 % injection 5 mL  5 mL Intradermal PRN Adelfa Koh, NP      . lidocaine-prilocaine (EMLA) cream 1 application  1 application Topical PRN Adelfa Koh, NP      . metroNIDAZOLE (FLAGYL) IVPB 500 mg  500 mg Intravenous Once Caccavale, Sophia, PA-C 100 mL/hr at 10/05/20 1621 500 mg at 10/05/20 1621  . pentafluoroprop-tetrafluoroeth (GEBAUERS) aerosol 1 application  1 application Topical PRN Adelfa Koh, NP      . vancomycin (VANCOREADY) IVPB 1000 mg/200 mL  1,000 mg Intravenous Once Thea Gist, [facility]       Current Outpatient Medications  Medication Sig Dispense Refill  . Amino Acids-Protein Hydrolys (FEEDING SUPPLEMENT, PRO-STAT SUGAR FREE 64,) LIQD Take 30 mLs by mouth in the morning. WILD CHERRY FLAVOR    . aspirin 81 MG chewable tablet Chew 81 mg by mouth daily.    Marland Kitchen atorvastatin (LIPITOR) 40 MG tablet Take 40 mg by mouth at bedtime.     . carvedilol (COREG) 3.125 MG tablet Take 3.125 mg by mouth 2 (two) times daily with a meal.    . cloNIDine (CATAPRES) 0.1 MG tablet Take 0.1 mg by mouth every 8 (eight) hours as needed (BP>160/90).    . famotidine (PEPCID) 10 MG tablet Take 10 mg by mouth in the morning.    Marland Kitchen FLUoxetine (PROZAC) 20 MG tablet Take 20 mg by mouth daily.    . midodrine (PROAMATINE) 10 MG tablet Take 10 mg by mouth as directed. Take 1 tablet (10 mg) prior to Dialysis    . Multiple Vitamins-Minerals (CERTAVITE SENIOR) TABS Take 1 tablet by mouth daily.    Marland Kitchen omega-3 acid ethyl esters (LOVAZA) 1 g capsule Take 2 capsules by mouth 2 (two) times daily.    Marland Kitchen omeprazole (PRILOSEC) 40 MG capsule Take 40 mg by mouth daily before breakfast.     . oxyCODONE (OXY IR/ROXICODONE) 5 MG immediate release tablet Take 2.5 mg by mouth every 8 (eight) hours as needed for moderate pain or severe pain (sob).    . polyethylene glycol powder (GLYCOLAX/MIRALAX) 17  GM/SCOOP powder Take 17 g by mouth See admin instructions. Mix 17 grams of powder into 8 ounces of fluid, stir, and drink (by mouth) once a day    . SACCHAROMYCES BOULARDII PO Take 1 capsule by mouth 2 (two) times daily.    Marland Kitchen senna-docusate (SENOKOT-S) 8.6-50 MG tablet Take 1 tablet by mouth at bedtime as needed for mild constipation. 30 tablet 0  . sevelamer carbonate (RENVELA) 800 MG tablet Take 800 mg by mouth 3 (three) times daily with meals.    . sodium bicarbonate 650 MG tablet Take 1,300 mg by mouth 2 (two) times daily.    . Vitamin D, Ergocalciferol, (DRISDOL) 50000 units CAPS capsule Take 1 capsule (50,000 Units total) by mouth every 7 (seven) days. 30 capsule 0  . HUMALOG 100 UNIT/ML injection Inject 0.05 mLs (5 Units total) into the skin 3 (  three) times daily with meals. (Patient not taking: No sig reported) 10 mL 11     ALLERGIES Hydrocodone  MEDICAL HISTORY Past Medical History:  Diagnosis Date  . Ambulates with cane   . Constipation   . Depression   . ESRD (end stage renal disease) ([place])    a. TTS Dialysis.  Marland Kitchen GERD (gastroesophageal reflux disease)   . History of stress test    a. 01/2003 MV: EF 74%, no ischemia/infarct.  . Hyperlipidemia   . Hypertension   . Osteoporosis   . Stroke [facility]) 04-01-11   left frontal subcortical, saw Dr. [name]   . Syncope 11/2019  . TIA (transient ischemic attack) 03-12-11  . Tobacco abuse   . Tricuspid regurgitation    a. 05/2016 Echo: EF 65-70%, Gr2DD, mild MR, nl RV fxn, Triv TR, PASP 66mHg; b. 05/2018 Echo: EF 60-65%, Gr2DD, mild MR/TR, RVSP/PASP 367mg; c. 11/2019 Echo: EF 55-60%, no rwma, mild-mod MR, Sev TR w/ RV dilatation (CTA chest neg for PE).  . [place]  . Vitamin D deficiency      SOCIAL HISTORY Social History   Socioeconomic History  . Marital status: Single    Spouse name: Not on file  . Number of children: Not on file  . Years of education: Not on file  . Highest education level: Not on file   Occupational History  . Not on file  Tobacco Use  . Smoking status: Current Every Day Smoker    Packs/day: 0.30    Years: 32.00    Pack years: 9.60    Types: Cigarettes    Last attempt to quit: 09/23/2012    Years since quitting: 8.0  . Smokeless tobacco: Never Used  . Tobacco comment: 8 cigarettes/day  Vaping Use  . Vaping Use: Never used  Substance and Sexual Activity  . Alcohol use: No  . Drug use: No  . Sexual activity: Not Currently    Birth control/protection: Post-menopausal  Other Topics Concern  . Not on file  Social History Narrative   [facility]    Right Handed   Drinks 2-4 cups caffeine daily      Social Determinants of Health   Financial Resource Strain: Not on file  Food Insecurity: Not on file  Transportation Needs: Not on file  Physical Activity: Not on file  Stress: Not on file  Social Connections: Not on file  Intimate Partner Violence: Not on file     FAMILY HISTORY Family History  Problem Relation Age of Onset  . Stroke Mother   . Diabetes Mother   . Kidney failure Mother   . Heart failure Mother   . Stroke Father   . Cancer Sister        Breast- 5067's   Review of Systems: 12 systems were reviewed and negative except per HPI  Physical Exam: Vitals:   10/05/20 1530 10/05/20 1600  BP: (!) 172/87 (!) 179/87  Pulse: 67 66  Resp: 16 12  SpO2: 97% 93%   No intake/output data recorded. No intake or output data in the 24 hours ending 10/05/20 1640 General: chronically ill appearing, no acute distress HEENT: sclera slightly icteric CV: normal rate, +systolic murmur, no edema Lungs: bilateral chest rise, normal wob, cta bl Abd: soft, non-tender, non-distended Skin: no visible lesions or rashes Neuro: slow to respond, follows some commands Access: [name] AVF with +b/t  Test Results Reviewed Lab Results  Component Value  Date   NA 140 10/05/2020   K 5.2 (H) 10/05/2020   CL 101 10/05/2020   CO2 22  10/05/2020   BUN 61 (H) 10/05/2020   CREATININE 7.60 (H) 10/05/2020   GFR 141.12 05/25/2012   CALCIUM 9.1 10/05/2020   ALBUMIN 2.9 (L) 10/05/2020   PHOS 1.8 (L) 08/22/2020    I have reviewed relevant outside healthcare records

## 2020-10-05 NOTE — Sepsis Progress Note (Signed)
Sent note via secure chat to primary RN to try to get repeat lactic drawn when patient goes for HD.

## 2020-10-05 NOTE — H&P (Addendum)
History and Physical  [name] Michael [id number] DOB: 08-Jun-1961 DOA: 10/05/2020  Referring physician:  PA, Franchot Michael PCP: [name] Rakes, MD  Outpatient Specialists:  Patient coming from: [name]  Chief Complaint: weakness and missing HD sessions  HPI:  [name] Michael is a 60 year old female with medical history significant for ESRD on HD, HTN, HLD who presented from her assisted living facility [facility] due to missed HD sessions for the past week as she has reportedly been unable to complete a fulls session dueto reported worsening weakness and increased oxygen requirements.  History is limited as patient was very slow in speech and provides very minimal context.  Patient was last admitted to [facility] from 3/1-3/3 for acute metabolic encephalopathy presumed secondary to missed HD sessions as well as polypharmacy from home gabapentin, Topamax and Prozac at that time.  Infectious work-up was unremarkable and no neurologic etiology for encephalopathy found CT head and MRI brain at that time.   In the ED patient was found to be afebrile with initial SPO2 of 83% requiring 15 L of oxygen support before downtrending to 3 L and maintaining sat 93%.  Initial blood pressure 206/81, now down trended to 179/87 with heart rate range 59-56.  Laboratory before potassium of 5.2, creatinine 7.6, glucose 143, VBG PO2 of 30, pH 7.3, lactic acid 5.3, platelets 72 (baseline 295).  Chest x-ray showed chronic bronchitic changes with enlargement of cardiac silhouette.  Patient was started empirically on vancomycin, Flagyl, cefepime in the ED due to concern for hidden infection in the setting of elevated lactic acidosis.  [facility] service was called for further management.   Review of Systems:As mentioned in the history of present illness.Review of systems are otherwise negative Patient seen in the ED .   Past Medical History:  Diagnosis Date  . Ambulates with cane   .  Constipation   . Depression   . ESRD (end stage renal disease) ([place])    a. TTS Dialysis.  Marland Kitchen GERD (gastroesophageal reflux disease)   . History of stress test    a. 01/2003 MV: EF 74%, no ischemia/infarct.  . Hyperlipidemia   . Hypertension   . Osteoporosis   . Stroke [facility]) 04-01-11   left frontal subcortical, saw Dr. Leonie Michael   . Syncope 11/2019  . TIA (transient ischemic attack) 03-12-11  . Tobacco abuse   . Tricuspid regurgitation    a. 05/2016 Echo: EF 65-70%, Gr2DD, mild MR, nl RV fxn, Triv TR, PASP 21mHg; b. 05/2018 Echo: EF 60-65%, Gr2DD, mild MR/TR, RVSP/PASP 352mg; c. 11/2019 Echo: EF 55-60%, no rwma, mild-mod MR, Sev TR w/ RV dilatation (CTA chest neg for PE).  . [place] II diabetes [place]  . Vitamin D deficiency    Past Surgical History:  Procedure Laterality Date  . AV FISTULA PLACEMENT Left 02/21/2019   Procedure: BRACHIOCEPHALIC ARTERIOVENOUS (AV) FISTULA CREATION;  Surgeon: ClMarty Michael;  [facility];  Laterality: Left;  . BASCILIC VEIN TRANSPOSITION Left 04/11/2019   Procedure: SECOND STAGE BASILIC VEIN TRANSPOSITION LEFT ARM;  Surgeon: ClMarty Michael;  [place] [facility] Service: Vascular;  Laterality: Left;  . IR FLUORO GUIDE CV LINE RIGHT  04/29/2019  . IR [place]IDE VASC ACCESS RIGHT  04/29/2019  . OPEN REDUCTION INTERNAL FIXATION (ORIF) DISTAL RADIAL FRACTURE Left 01/28/2016   Procedure: OPEN REDUCTION INTERNAL FIXATION (ORIF) DISTAL RADIAL FRACTURE;  Surgeon: FrIran Michael;  [place] [facility] Service: Orthopedics;  Laterality: Left;  . [place] CATH N/A 12/05/2019   Procedure: RIGHT HEART CATH;  Surgeon: [name] Bush, MD;  Location: [name];  Service: Cardiovascular;  Laterality: N/A;  . TONSILLECTOMY AND ADENOIDECTOMY     age 28   Allergies  Allergen Reactions  . Hydrocodone Nausea And Vomiting and Other (See Comments)    "Allergic," per [name]   Social  History:  reports that she has been smoking cigarettes. She has a 9.60 pack-year smoking history. She has never used smokeless tobacco. She reports that she does not drink alcohol and does not use drugs. Family History  Problem Relation Age of Onset  . Stroke Mother   . Diabetes Mother   . Kidney failure Mother   . Heart failure Mother   . Stroke Father   . Cancer Sister        Breast- 64's      Prior to Admission medications   Medication Sig Start Date End Date Taking? Authorizing Provider  Amino Acids-Protein Hydrolys (FEEDING SUPPLEMENT, PRO-STAT SUGAR FREE 64,) LIQD Take 30 mLs by mouth in the morning. WILD CHERRY FLAVOR   Yes [provider]  aspirin 81 MG chewable tablet Chew 81 mg by mouth daily.   Yes [provider]  atorvastatin (LIPITOR) 40 MG tablet Take 40 mg by mouth at bedtime.    Yes [provider]  carvedilol (COREG) 3.125 MG tablet Take 3.125 mg by mouth 2 (two) times daily with a meal.   Yes [provider]  cloNIDine (CATAPRES) 0.1 MG tablet Take 0.1 mg by mouth every 8 (eight) hours as needed (BP>160/90).   Yes [provider]  famotidine (PEPCID) 10 MG tablet Take 10 mg by mouth in the morning.   Yes [provider]  FLUoxetine (PROZAC) 20 MG tablet Take 20 mg by mouth daily.   Yes [provider]  midodrine (PROAMATINE) 10 MG tablet Take 10 mg by mouth as directed. Take 1 tablet (10 mg) prior to Dialysis   Yes [provider]  Multiple Vitamins-Minerals (CERTAVITE SENIOR) TABS Take 1 tablet by mouth daily.   Yes [provider]  omega-3 acid ethyl esters (LOVAZA) 1 g capsule Take 2 capsules by mouth 2 (two) times daily. 05/28/20  Yes [provider]  omeprazole (PRILOSEC) 40 MG capsule Take 40 mg by mouth daily before breakfast.    Yes [provider]  oxyCODONE (OXY IR/ROXICODONE) 5 MG immediate release tablet Take 2.5 mg by mouth every 8 (eight) hours as needed for  moderate pain or severe pain (sob). 10/04/20  Yes [provider]  polyethylene glycol powder (GLYCOLAX/MIRALAX) 17 GM/SCOOP powder Take 17 g by mouth See admin instructions. Mix 17 grams of powder into 8 ounces of fluid, stir, and drink (by mouth) once a day   Yes [provider]  SACCHAROMYCES BOULARDII PO Take 1 capsule by mouth 2 (two) times daily.   Yes [provider]  senna-docusate (SENOKOT-S) 8.6-50 MG tablet Take 1 tablet by mouth at bedtime as needed for mild constipation. 06/11/18  Yes Regalado, [name], MD  sevelamer carbonate (RENVELA) 800 MG tablet Take 800 mg by mouth 3 (three) times daily with meals.   Yes [provider]  sodium bicarbonate 650 MG tablet Take 1,300 mg by mouth 2 (two) times daily.   Yes [provider]  Vitamin D, Ergocalciferol, (DRISDOL) 50000 units CAPS capsule Take 1 capsule (  50,000 Units total) by mouth every 7 (seven) days. 11/21/16  Yes Ghimire, Henreitta Leber, MD  HUMALOG 100 UNIT/ML injection Inject 0.05 mLs (5 Units total) into the skin 3 (three) times daily with meals. Patient not taking: No sig reported 08/23/20   Madalyn Rob, MD    Physical Exam: BP (!) 163/53   Pulse 66   Temp 98.3 F (36.8 C) (Axillary)   Resp 18   LMP 03/06/2013 (LMP Unknown)   SpO2 95%   Constitutional chronically ill-appearing female, looks older than stated age Eyes: Mild scleral icterus, protruding eyes ENMT: Oropharynx with moist mucous membranes, normal dentition Neck: FROM, no appreciable thyromegaly Cardiovascular: RRR with SEM heard loudest at apex, with no peripheral edema Respiratory: Normal respiratory effort liters, clear breath sounds  Abdomen: Soft, tenderness with deep palpation of right upper quadrant, no rebound tenderness, no guarding, no rigidity Skin: No rash ulcers, or lesions. Without skin tenting  Neurologic: Follows commands albeit slowly.  Able to state name. Psychiatric:Appropriate affect, and mood. Mental  status alert to self only          Labs on Admission:  Basic Metabolic Panel: Recent Labs  Michael 10/05/20 1309 10/05/20 1401 10/05/20 1412  NA 141 139 140  K 5.0 5.3* 5.2*  CL 100  --  101  CO2 22  --   --   GLUCOSE 160*  --  143*  BUN 48*  --  61*  CREATININE 8.00*  --  7.60*  CALCIUM 9.1  --   --    Liver Function Tests: Recent Labs  Michael 10/05/20 1309  AST 135*  ALT 84*  ALKPHOS 90  BILITOT 2.2*  PROT 6.3*  ALBUMIN 2.9*   No results for input(s): LIPASE, AMYLASE in the last 168 hours. No results for input(s): AMMONIA in the last 168 hours. CBC: Recent Labs  Michael 10/05/20 1309 10/05/20 1401 10/05/20 1412  WBC 8.3  --   --   NEUTROABS 6.3  --   --   HGB 11.6* 14.6 14.6  HCT 39.5 43.0 43.0  MCV 104.2*  --   --   PLT 72*  --   --    Cardiac Enzymes: No results for input(s): CKTOTAL, CKMB, CKMBINDEX, TROPONINI in the last 168 hours.  BNP (last 3 results) Recent Labs    12/03/19 0329  BNP 1,275.7*    ProBNP (last 3 results) No results for input(s): PROBNP in the last 8760 hours.  CBG: No results for input(s): GLUCAP in the last 168 hours.  Radiological Exams on Admission: DG Chest Portable 1 View  Result Date: 10/05/2020 CLINICAL DATA:  Weakness and shortness of breath EXAM: PORTABLE CHEST 1 VIEW COMPARISON:  Portable exam 1323 hours compared to 08/21/2020 FINDINGS: Enlargement of cardiac silhouette. Mediastinal contours and pulmonary vascularity normal. Atherosclerotic calcification aorta. Chronic peribronchial thickening. No pulmonary infiltrate, pleural effusion, or pneumothorax. Eventration RIGHT diaphragm stable. Bones demineralized. IMPRESSION: Chronic bronchitic changes without infiltrate. Enlargement of cardiac silhouette. Aortic Atherosclerosis (ICD10-I70.0). Electronically Signed   By: Lavonia Dana M.D.   On: 10/05/2020 13:51    EKG: Independently reviewed. unchanged from previous tracings.  Assessment/Plan Present on Admission: . Acute  metabolic encephalopathy . Anemia of chronic disease . Chronic diastolic CHF (congestive heart failure) ([place]) . Depression . ESRD (end stage renal disease) ([place]) . Essential hypertension . Hyperlipidemia . Severe tricuspid regurgitation . Lactic acidosis . Transaminitis . Thrombocytopenia ([place]) . Acute respiratory failure with hypoxia (HCC)  Active Problems:   Hyperlipidemia  Essential hypertension   Depression   Chronic diastolic CHF (congestive heart failure) (HCC)   Anemia of chronic disease   ESRD (end stage renal disease) (HCC)   Type II diabetes mellitus (HCC)   Severe tricuspid regurgitation   Acute metabolic encephalopathy   Lactic acidosis   Transaminitis   Thrombocytopenia (HCC)   Acute respiratory failure with hypoxia (HCC)   Increased weakness, unclear etiology.  Patient with no overt neurologic deficits on exam, able to follow commands (thumbs up, wiggle toes), but slow to speak.  Discussed with nephrologist at bedside her current affect seems inconsistent with baseline, also corroborated in recent hospitalization for similar systoms.  Suspect some alteration in mental status likely related to some uremic effect from missing for HD sessions. But other etiologies include thyroid dysfunction, surreptitious infection, pharmacy effect - CT head stat to ensure no acute neurologic changes -check ammonia -Check TSH - Nephrology consulted, planning for consecutive HD sessions starting tonight - Holding home oxycodone  Acute encephalopathy, suspect metabolic.  Uremia, high on differential given missing HD sessions.  Doubt infection given patient remains afebrile with no leukocytosis, no tachycardia, no tachypnea or other signs of sepsis but did receive empiric antibiotics in ED due to lactic acidosis and nonspecific complaints. Per sister her mental status baseline is alert and oriented x4 though she states when she's not able to smoke she tends to withdraw, no obvious  contributing medications other than her home oxycodone. She does have what looks to be exophthalmos could be related to thyroid issue. Additionally with some abdominal complaints in RUQ and cardiomegaly with history of CHF want to rule out elevated ammonia in setting of possible liver dysfunction especially considering elevated LFTs - Started on empiric antibiotics in the ED, will continue Vanco, cefepime, Flagyl for now - Monitor blood cultures, suspect may be able to discontinue antibiotics if blood cultures remain clear for next 24 hours - Check ammonia - Check TSH - CT head - Holding home oxycodone  Acute hypoxic respiratory failure, unclear etiology.  Presented with oxygen saturation of 83% requiring 15 L.  Now improved to 3 L nasal cannula which is a new requirement.  Reportedly has had increased O2 requirements at ALF.  Chest x-ray shows no acute disease other than chronic bronchitic changes, of note do see increased cardiac silhouette, no peripheral edema or JVD on exam but at high risk for hypervolemia given missed HD session regarding ESRD as well in addition to chronic CHF with severe TR and V dilatation from [facility] 11/2019 --wean O2 as able to maintain SPO2 greater than 92% - Check TTE - Low threshold to check CT chest or CTA  Lactic acidosis with reported rectal pain and right upper quadrant abdominal pain.  Doubt perfusion related as patient is actually hypertensive on admission.  No contributing medications on home regimen.  Reportedly some rectal pain, patient also reports some right-sided abdominal pain to me on palpation, mesenteric ischemia could also be a differential as well as ongoing biliary/gallbladder/liver dysfunction - Trend lactic acid - Lipase - CT abdomen with contrast, stat - Regular ultrasound - Trend LFTs, bilirubin -avoid IV fluids for now given doubt perfusion related and given high risk for hyperkalemia given ESRD and missed HD sessions  Transaminitis with mild  hyperbilirubinemia.  Previous baseline within normal limits, currently AST 135 and ALT 84.  No hypotensive blood pressures so doubt ischemic related, patient does complain of some right upper quadrant tenderness and seems to have some mild scleral icterus.  No asterixis on exam, no noted history of cirrhosis. Given history of CHF with severe TR and RV dilatation concern for with preserved EF exacerbation in setting of missing HD that could lead to hepatic congestion - Hold home statin - Trend LFTs, bilirubin - Hepatitis panel -check ammonia -Check TTE -right upper quadrant ultrasound for better evaluation after CT abdomen  Chronic diastolic CHF, volume status looks stable.  Last EF on 11/2019 55-60%.  With noted severe TR and RV dilatation on that same TTE.  No obvious peripheral edema, no JVD appreciable on physical exam.  Some elevated LFTs that could be suggestive of hepatic congestion, only cardiomegaly seen on chest x-ray.  Hypoxia could be related to slow portable volume from his HD sessions, but if no improvement could also consider possible PE causing elevated right heart pressures leading to worsening tricuspid valve function - TTE - Daily weights -monitor ins and outs  Thrombocytopenia, acute.  Platelet count currently 72, down from baseline in the 200s.  Possibly related to platelet dysfunction in the setting of ESRD?.  Given altered mental status as well would like to rule out any hemolytic events - We will avoid any heparin products -monitor CBC -continue SCDs - Peripheral smear  Hypertension, not at goal but improving.  Peak blood pressure of 202/80 on admission to the ED.  Currently 163/53. - Continue home carvedilol 3.125 mg twice daily, clonidine 0.1 every 8 hrs -hydralazine IV as needed  Hyperlipidemia - Hold home statin given elevated LFTs  Depression, stable - Continue home fluoxetine  ESRD on HD.  Has missed several doses of dialysis as outpatient due to reported  weakness and reported hypoxia.  Left-sided AV fistula In discussion with nephrologist at bedside -nephrology consulted, plan for consecutive HD sessions - Continue sodium bicarb, Renvela  Type 2 diabetes Last A1c in March 6.8.  Currently not on any insulin as outpatient (reduced on last hospital stay) - Sliding scale insulin as needed, monitor CBGs  Goals of care discussion. Seen by palliative care as outpatient on 07/2020. At that time she wanted to continue with dialysis and did not want hospice at the time. At that time she wanted to be DNR. --I confirmed with her sister that she is still DNR --consult palliative  DVT prophylaxis: SCDs, holding off on other DVT given acute thrombocytopenia  Code Status: DNR, confirmed with sister   Family Communication: Per patient request called  Sister Hinton Dyer at [phone]. Updated on phone  Disposition Plan: guarded   Consults called: Nephrology per EDP   Admission status: Admitted as inpatient to progressive  unit.      Desiree Hane MD [facility]  Pager [phone]  If 7PM-7AM, please contact night-coverage [email or web address] Password [facility]  10/05/2020, 6:20 PM

## 2020-10-05 NOTE — Progress Notes (Addendum)
1928 Pt arriived to floor.  2019 Spoke with HD nurse to convey message to obtain lactic acid and they replied they will draw it.   2140 Pt left at this time for dialysis accompanied by transporter.

## 2020-10-05 NOTE — Progress Notes (Signed)
Pharmacy Antibiotic Note  [name] Michael is a 60 y.o. female admitted on 10/05/2020 with sepsis.  Patient with ESRD on HD T/Th/S but has not had a full session since last Thursday (4/7). On admit, WBC wnl at 8.3, LAC elevated at 5.3, and was afebrile. Pharmacy has been consulted for vancomycin and cefepime dosing.  Plan: Metronidazole 500 mg IV x1 per MD  Cefepime 1g IV q24h Vancomycin 1g IV x1 - F/u HD schedule for further vancomycin doses (should get 500 mg based on weight) - F/u further metronidazole doses if necessary  - Monitor clinical improvement, cultures, and HD schedule  - F/u LOT and de-escalation     No data recorded.  Recent Labs  Lab 10/05/20 1309 10/05/20 1310 10/05/20 1412  WBC 8.3  --   --   CREATININE  --   --  7.60*  LATICACIDVEN  --  5.3*  --     Estimated Creatinine Clearance: 4.6 mL/min (A) (by C-G formula based on SCr of 7.6 mg/dL (H)).    Allergies  Allergen Reactions  . Hydrocodone Nausea And Vomiting and Other (See Comments)    "Allergic," per MAR    Antimicrobials this admission: Metronidazole 4/15 >>  Cefepime 4/15 >>  Vancomycin 4/15 >>   Microbiology results: 4/15 Bcx: sent 4/15 Ucx: sent 4/15 Resp panel: sent   Thank you for allowing pharmacy to be a part of this patient's care.  Claudina Lick, PharmD PGY1 Acute Care Pharmacy Resident 10/05/2020 2:54 PM  Please check [email or web address] for unit-specific pharmacy phone numbers.

## 2020-10-05 NOTE — ED Provider Notes (Signed)
I provided a substantive portion of the care of this patient.  I personally performed the entirety of the medical decision making for this encounter.  EKG Interpretation  Date/Time:  Friday October 05 2020 12:55:37 EDT Ventricular Rate:  66 PR Interval:  127 QRS Duration: 153 QT Interval:  508 QTC Calculation: 533 R Axis:   132 Text Interpretation: Sinus rhythm RBBB and LPFB No significant change since last tracing Confirmed by Lacretia Leigh ([id number]) on 10/05/2020 1:53:47 PM  60 year old female presents with weakness.  Patient has missed dialysis for several sessions.  Initially hypoxemic given oxygen with good improvement.  Labs and work-up pending at this time.  Will likely require inpatient admission   Lacretia Leigh, MD 10/05/20 1320

## 2020-10-05 NOTE — ED Triage Notes (Addendum)
Pt BIB [facility] from [facility] c/o weakness and missing dialysis. Pt is a dialysis pt T,TH,S. Pt  has not had a full dialysis treatment since last Thursday. Pt has been missing dialysis due to weakness and then graft site/access issues. Pt denies any pain just feels weak. Pt seen here about 8 weeks ago for the weakness and was sent to [facility], per facility weakness just has not improved.

## 2020-10-05 NOTE — Progress Notes (Signed)
Attempted ABG x 2, unable to obtain blood.  Lab drew VBG per discussion w/ [place] PA.

## 2020-10-05 NOTE — ED Provider Notes (Signed)
     Lacretia Leigh, MD 10/06/20 [phone]

## 2020-10-05 NOTE — Progress Notes (Signed)
Panic VBG results reported to [facility]. Sat 97% currently, no respiratory distress currently noted.

## 2020-10-05 NOTE — Progress Notes (Signed)
  Echocardiogram 2D Echocardiogram has been performed.  [name] Michael 10/05/2020, 6:20 PM

## 2020-10-05 NOTE — Progress Notes (Signed)
elink monitoring for sepsis protocol 

## 2020-10-05 NOTE — ED Provider Notes (Signed)
[place] EMERGENCY DEPARTMENT Provider Note   CSN: [id number] Arrival date & time: 10/05/20  1247     History Chief Complaint  Patient presents with  . Weakness  . Vascular Access Problem    [name] Michael is a 60 y.o. female presenting for evaluation of weakness.  Level 5 caveat due to altered mental status and acuity of condition.  Per triage note, patient coming from [facility] where she has had issues with weakness and missing dialysis.  She goes to dialysis Tuesday, Thursday, Saturday.  Last session was last Thursday as patient missed it due to weakness and graft site/access issues.  Per RN, patient has had hypoxia over the past 2 days at the facility, as such she was on oxygen when EMS arrived.  However with EMS, patient had good oxygenation.  Patient is not reporting any shortness of breath.  No known fevers.  Additional history taken chart reviewed.  Patient with a history of depression, ESRD on dialysis, GERD, hypertension, previous stroke, diabetes, CHF.   HPI     Past Medical History:  Diagnosis Date  . Ambulates with cane   . Constipation   . Depression   . ESRD (end stage renal disease) ([place])    a. TTS Dialysis.  Marland Kitchen GERD (gastroesophageal reflux disease)   . History of stress test    a. 01/2003 MV: EF 74%, no ischemia/infarct.  . Hyperlipidemia   . Hypertension   . Osteoporosis   . Stroke [facility]) 04-01-11   left frontal subcortical, saw Dr. [name]   . Syncope 11/2019  . TIA (transient ischemic attack) 03-12-11  . Tobacco abuse   . Tricuspid regurgitation    a. 05/2016 Echo: EF 65-70%, Gr2DD, mild MR, nl RV fxn, Triv TR, PASP 56mHg; b. 05/2018 Echo: EF 60-65%, Gr2DD, mild MR/TR, RVSP/PASP 374mg; c. 11/2019 Echo: EF 55-60%, no rwma, mild-mod MR, Sev TR w/ RV dilatation (CTA chest neg for PE).  . Type II diabetes mellitus ([facility]  . Vitamin D deficiency     Patient Active Problem List   Diagnosis Date Noted  . Altered mental status  08/22/2020  . Acute metabolic encephalopathy [id number]. Severe tricuspid regurgitation 12/04/2019  . ESRD (end stage renal disease) ([facility]  . GERD (gastroesophageal reflux disease)   . Hypertension   . Type II diabetes mellitus ([facility] [place]  . 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 AMS (altered mental status) 04/28/2019  . Hypoxemia 04/28/2019  . Fall 06/09/2018  . Fall at home, initial encounter 06/09/2018  . Anemia of chronic disease 06/09/2018  . Syncope and collapse 06/09/2018  . Acute encephalopathy 03/10/2018  . Hypermagnesemia 03/10/2018  . Acute lower UTI 11/09/2016  . Uncontrolled type 2 diabetes mellitus with complication ([facility]  . 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 associated with diabetes mellitus due to underlying condition ([facility]  . Chronic diastolic CHF (congestive heart failure) ([facility]  . Proliferative diabetic retinopathy (HC[place]11/12/2015  . Poor social situation 03/09/2013  . Depression 03/01/2013  . Abnormal mammogram 12/20/2012  . Retinal detachment 11/17/2012  . 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 (HCS[id number]. Hyperlipidemia 02/09/2007  . Essential hypertension 02/09/2007    Past Surgical History:  Procedure Laterality Date  . AV FISTULA PLACEMENT Left 02/21/2019   Procedure: BRACHIOCEPHALIC ARTERIOVENOUS (AV) FISTULA CREATION;  Surgeon: ClMarty HeckMD;  [place] Service: Vascular;  Laterality: Left;  . BASCILIC  VEIN TRANSPOSITION Left 04/11/2019   Procedure: SECOND STAGE BASILIC VEIN TRANSPOSITION LEFT ARM;  Surgeon: [name], MD;  Location: [place];  Service: Vascular;  Laterality: Left;  . IR FLUORO GUIDE CV LINE RIGHT  04/29/2019  . IR US GUIDE VASC ACCESS RIGHT  04/29/2019  . OPEN REDUCTION INTERNAL FIXATION (ORIF) DISTAL RADIAL FRACTURE Left 01/28/2016   Procedure: OPEN REDUCTION INTERNAL FIXATION (ORIF) DISTAL RADIAL FRACTURE;  Surgeon: Iran Planas, MD;  Location: [place];  Service: Orthopedics;  Laterality: Left;  . [place] CATH N/A 12/05/2019   Procedure: RIGHT HEART CATH;  Surgeon: Nelva Bush, MD;  Location: [place] CV LAB;  Service: Cardiovascular;  Laterality: N/A;  . TONSILLECTOMY AND ADENOIDECTOMY     age 62     OB History   No obstetric history on file.     Family History  Problem Relation Age of Onset  . Stroke Mother   . Diabetes Mother   . Kidney failure Mother   . Heart failure Mother   . Stroke Father   . Cancer Sister        Breast- 33's    Social History   Tobacco Use  . Smoking status: Current Every Day Smoker    Packs/day: 0.30    Years: 32.00    Pack years: 9.60    Types: Cigarettes    Last attempt to quit: 09/23/2012    Years since quitting: 8.0  . Smokeless tobacco: Never Used  . Tobacco comment: 8 cigarettes/day  Vaping Use  . Vaping Use: Never used  Substance Use Topics  . Alcohol use: No  . Drug use: No    Home Medications Prior to Admission medications   Medication Sig Start Date End Date Taking? Authorizing Provider  Amino Acids-Protein Hydrolys (FEEDING SUPPLEMENT, PRO-STAT SUGAR FREE 64,) LIQD Take 30 mLs by mouth in the morning. WILD CHERRY FLAVOR   Yes [provider]  aspirin 81 MG chewable tablet Chew 81 mg by mouth daily.   Yes [provider]  atorvastatin (LIPITOR) 40 MG tablet Take 40 mg by mouth at bedtime.    Yes [provider]  carvedilol (COREG) 3.125 MG tablet Take 3.125 mg by mouth 2 (two) times daily with a meal.   Yes [provider]  cloNIDine (CATAPRES) 0.1 MG tablet Take 0.1 mg by mouth every 8 (eight) hours as needed (BP>160/90).   Yes [provider]  famotidine (PEPCID) 10 MG tablet Take 10 mg by mouth in the morning.   Yes [provider]  FLUoxetine (PROZAC) 20 MG tablet Take 20 mg by mouth daily.   Yes [provider]  midodrine (PROAMATINE) 10 MG tablet  Take 10 mg by mouth as directed. Take 1 tablet (10 mg) prior to Dialysis   Yes [provider]  Multiple Vitamins-Minerals (CERTAVITE SENIOR) TABS Take 1 tablet by mouth daily.   Yes [provider]  omega-3 acid ethyl esters (LOVAZA) 1 g capsule Take 2 capsules by mouth 2 (two) times daily. 05/28/20  Yes [provider]  omeprazole (PRILOSEC) 40 MG capsule Take 40 mg by mouth daily before breakfast.    Yes [provider]  oxyCODONE (OXY IR/ROXICODONE) 5 MG immediate release tablet Take 2.5 mg by mouth every 8 (eight) hours as needed for moderate pain or severe pain (sob). 10/04/20  Yes [provider]  polyethylene glycol powder (GLYCOLAX/MIRALAX) 17 GM/SCOOP powder Take 17  g by mouth See admin instructions. Mix 17 grams of powder into 8 ounces of fluid, stir, and drink (by mouth) once a day   Yes [provider]  SACCHAROMYCES BOULARDII PO Take 1 capsule by mouth 2 (two) times daily.   Yes [provider]  senna-docusate (SENOKOT-S) 8.6-50 MG tablet Take 1 tablet by mouth at bedtime as needed for mild constipation. 06/11/18  Yes Regalado, [name], MD  sevelamer carbonate (RENVELA) 800 MG tablet Take 800 mg by mouth 3 (three) times daily with meals.   Yes [provider]  sodium bicarbonate 650 MG tablet Take 1,300 mg by mouth 2 (two) times daily.   Yes [provider]  Vitamin D, Ergocalciferol, (DRISDOL) 50000 units CAPS capsule Take 1 capsule (50,000 Units total) by mouth every 7 (seven) days. 11/21/16  Yes Ghimire, Henreitta Leber, MD  HUMALOG 100 UNIT/ML injection Inject 0.05 mLs (5 Units total) into the skin 3 (three) times daily with meals. Patient not taking: No sig reported 08/23/20   Madalyn Rob, MD    Allergies    Hydrocodone  Review of Systems   Review of Systems  Unable to perform ROS: Mental status change  Neurological: Positive for weakness.    Physical Exam Updated Vital Signs BP (!) 179/87   Pulse 66    Resp 12   LMP 03/06/2013 (LMP Unknown)   SpO2 93%   Physical Exam Vitals and nursing note reviewed.  Constitutional:      Appearance: She is well-developed. She is ill-appearing and toxic-appearing.     Comments: Patient appears ill.  Mostly not communicating  HENT:     Head: Normocephalic and atraumatic.  Eyes:     Conjunctiva/sclera: Conjunctivae normal.     Pupils: Pupils are equal, round, and reactive to light.  Cardiovascular:     Rate and Rhythm: Normal rate and regular rhythm.     Pulses: Normal pulses.     Comments: Fistula of the left upper extremity with good thrill Pulmonary:     Effort: Pulmonary effort is normal. No respiratory distress.     Breath sounds: Normal breath sounds. No wheezing.     Comments: Sats 77% on room air.  Mostly nonverbal, however no accessory muscle use.  Clear lung sounds bilaterally Abdominal:     General: There is no distension.     Palpations: Abdomen is soft. There is no mass.     Tenderness: There is no abdominal tenderness. There is no guarding or rebound.  Musculoskeletal:        General: Normal range of motion.     Cervical back: Normal range of motion and neck supple.     Right lower leg: No edema.     Left lower leg: No edema.  Skin:    General: Skin is warm and dry.     Capillary Refill: Capillary refill takes less than 2 seconds.  Neurological:     Mental Status: She is disoriented.     Comments: Opens eyes spontaneously and tracks movement in the room, however mostly not communicating verbally.     ED Results / Procedures / Treatments   Labs (all labs ordered are listed, but only abnormal results are displayed) Labs Reviewed  CBC WITH DIFFERENTIAL/PLATELET - Abnormal; Notable for the following components:      Result Value   RBC 3.79 (*)    Hemoglobin 11.6 (*)    MCV 104.2 (*)    MCHC 29.4 (*)    RDW 19.8 (*)  Platelets 72 (*)    nRBC 1.8 (*)    All other components within normal limits  COMPREHENSIVE  METABOLIC PANEL - Abnormal; Notable for the following components:   Glucose, Bld 160 (*)    BUN 48 (*)    Creatinine, Ser 8.00 (*)    Total Protein 6.3 (*)    Albumin 2.9 (*)    AST 135 (*)    ALT 84 (*)    Total Bilirubin 2.2 (*)    GFR, Estimated 5 (*)    Anion gap 19 (*)    All other components within normal limits  LACTIC ACID, PLASMA - Abnormal; Notable for the following components:   Lactic Acid, Venous 5.3 (*)    All other components within normal limits  I-STAT CHEM 8, ED - Abnormal; Notable for the following components:   Potassium 5.2 (*)    BUN 61 (*)    Creatinine, Ser 7.60 (*)    Glucose, Bld 143 (*)    Calcium, Ion 1.11 (*)    All other components within normal limits  I-STAT VENOUS BLOOD GAS, ED - Abnormal; Notable for the following components:   pO2, Ven 30.0 (*)    Potassium 5.3 (*)    Calcium, Ion 1.09 (*)    All other components within normal limits  RESP PANEL BY RT-PCR (FLU A&B, COVID) ARPGX2  CULTURE, BLOOD (ROUTINE X 2)  CULTURE, BLOOD (ROUTINE X 2)  URINE CULTURE  LACTIC ACID, PLASMA  PROTIME-INR  APTT  URINALYSIS, ROUTINE W REFLEX MICROSCOPIC  CBG MONITORING, ED    EKG EKG Interpretation  Date/Time:  Friday October 05 2020 12:55:37 EDT Ventricular Rate:  66 PR Interval:  127 QRS Duration: 153 QT Interval:  508 QTC Calculation: 533 R Axis:   132 Text Interpretation: Sinus rhythm RBBB and LPFB No significant change since last tracing Confirmed by Lacretia Leigh ([id number]) on 10/05/2020 1:15:25 PM   Radiology DG Chest Portable 1 View  Result Date: 10/05/2020 CLINICAL DATA:  Weakness and shortness of breath EXAM: PORTABLE CHEST 1 VIEW COMPARISON:  Portable exam 1323 hours compared to 08/21/2020 FINDINGS: Enlargement of cardiac silhouette. Mediastinal contours and pulmonary vascularity normal. Atherosclerotic calcification aorta. Chronic peribronchial thickening. No pulmonary infiltrate, pleural effusion, or pneumothorax. Eventration RIGHT diaphragm  stable. Bones demineralized. IMPRESSION: Chronic bronchitic changes without infiltrate. Enlargement of cardiac silhouette. Aortic Atherosclerosis (ICD10-I70.0). Electronically Signed   By: Lavonia Dana M.D.   On: 10/05/2020 13:51    Procedures .Critical Care Performed by: Franchot Heidelberg, PA-C Authorized by: Franchot Heidelberg, PA-C   Critical care provider statement:    Critical care time (minutes):  60   Critical care time was exclusive of:  Separately billable procedures and treating other patients and teaching time   Critical care was necessary to treat or prevent imminent or life-threatening deterioration of the following conditions:  Sepsis and renal failure   Critical care was time spent personally by me on the following activities:  Blood draw for specimens, development of treatment plan with patient or surrogate, discussions with consultants, evaluation of patient's response to treatment, examination of patient, obtaining history from patient or surrogate, ordering and performing treatments and interventions, ordering and review of laboratory studies, ordering and review of radiographic studies, pulse oximetry, re-evaluation of patient's condition and review of old charts   I assumed direction of critical care for this patient from another provider in my specialty: no     Care discussed with: admitting provider   Comments:  Patient was significantly elevated lactic acid and hypoxia, concerns for sepsis.  Additionally, needs emergent dialysis.     Medications Ordered in ED Medications  metroNIDAZOLE (FLAGYL) IVPB 500 mg (has no administration in time range)  ceFEPIme (MAXIPIME) 1 g in sodium chloride 0.9 % 100 mL IVPB (has no administration in time range)  vancomycin (VANCOREADY) IVPB 1000 mg/200 mL (has no administration in time range)  Chlorhexidine Gluconate Cloth 2 % PADS 6 each (has no administration in time range)  pentafluoroprop-tetrafluoroeth (GEBAUERS) aerosol 1  application (has no administration in time range)  lidocaine (PF) (XYLOCAINE) 1 % injection 5 mL (has no administration in time range)  lidocaine-prilocaine (EMLA) cream 1 application (has no administration in time range)  0.9 %  sodium chloride infusion (has no administration in time range)  0.9 %  sodium chloride infusion (has no administration in time range)  heparin injection 1,000 Units (has no administration in time range)  alteplase (CATHFLO ACTIVASE) injection 2 mg (has no administration in time range)    ED Course  I have reviewed the triage vital signs and the nursing notes.  Pertinent labs & imaging results that were available during my care of the patient were reviewed by me and considered in my medical decision making (see chart for details).    MDM Rules/Calculators/A&P                          Patient presented for evaluation of weakness.  On exam, patient appears ill.  She is mostly not communicating verbally, and sats are 77% on room air.  However, she does not have increased work of breathing.   She does not appear fluid overloaded.  After oxygenation, sats improved to mid 90s.  Concern for sepsis, pulmonary edema, uremia, metabolic abnormality.  Will obtain labs, chest x-ray, CT imaging.  Labs interpreted by me, shows concerning elevated lactic at 5.3.  In the setting of significant hypoxia and lactic acidosis, code sepsis was called and antibiotics started.  However due to patient being on dialysis and having missed over a week of sessions, fluids were held as blood pressure stable.  Will consult with nephrology.  Labs also show mild hyperkalemia.  Hemoglobin stable and no leukocytosis.  VBG without significant acidosis.  Chest x-ray viewed interpreted by me, no pneumonia pneumothorax and effusion.  EKG nonischemic.  Discussed with nephrology, who will evaluate the patient.  Patient be admitted to medicine.  Per nephrology, plan is for dialysis tonight.  They do not  recommend any fluids at this time, despite elevated lactic, as they are concerned about respiratory compromise.  I discussed with NP from nephrology who states patient has been complaining of significant rectal pain recently.  External exam overall reassuring.  Will obtain CT abdomen pelvis to ensure that there is no intra-abdominal source for patient's sepsis today.  Discussed with Dr. Teryl Lucy from [facility] hospitalist service, pt to be admitted.   Final Clinical Impression(s) / ED Diagnoses Final diagnoses:  Severe sepsis ([place])  Hypoxia  ESRD on dialysis [facility])    Rx / DC Orders ED Discharge Orders    None       Franchot Heidelberg, PA-C 10/05/20 1616    Lacretia Leigh, MD 10/06/20 [phone]

## 2020-10-06 LAB — CBC WITH DIFFERENTIAL/PLATELET
Abs Immature Granulocytes: 0.04 10*3/uL (ref 0.00–0.07)
Basophils Absolute: 0.1 10*3/uL (ref 0.0–0.1)
Basophils Relative: 1 %
Eosinophils Absolute: 0 10*3/uL (ref 0.0–0.5)
Eosinophils Relative: 1 %
HCT: 34.1 % — ABNORMAL LOW (ref 36.0–46.0)
Hemoglobin: 10.4 g/dL — ABNORMAL LOW (ref 12.0–15.0)
Immature Granulocytes: 1 %
Lymphocytes Relative: 14 %
Lymphs Abs: 0.9 10*3/uL (ref 0.7–4.0)
MCH: 30.6 pg (ref 26.0–34.0)
MCHC: 30.5 g/dL (ref 30.0–36.0)
MCV: 100.3 fL — ABNORMAL HIGH (ref 80.0–100.0)
Monocytes Absolute: 0.6 10*3/uL (ref 0.1–1.0)
Monocytes Relative: 9 %
Neutro Abs: 4.9 10*3/uL (ref 1.7–7.7)
Neutrophils Relative %: 74 %
Platelets: 65 10*3/uL — ABNORMAL LOW (ref 150–400)
RBC: 3.4 MIL/uL — ABNORMAL LOW (ref 3.87–5.11)
RDW: 19.3 % — ABNORMAL HIGH (ref 11.5–15.5)
WBC: 6.6 10*3/uL (ref 4.0–10.5)
nRBC: 1.5 % — ABNORMAL HIGH (ref 0.0–0.2)

## 2020-10-06 LAB — HEPATITIS PANEL, ACUTE
HCV Ab: NONREACTIVE
Hep A IgM: NONREACTIVE
Hep B C IgM: NONREACTIVE
Hepatitis B Surface Ag: NONREACTIVE

## 2020-10-06 LAB — COMPREHENSIVE METABOLIC PANEL
ALT: 81 U/L — ABNORMAL HIGH (ref 0–44)
AST: 120 U/L — ABNORMAL HIGH (ref 15–41)
Albumin: 2.4 g/dL — ABNORMAL LOW (ref 3.5–5.0)
Alkaline Phosphatase: 79 U/L (ref 38–126)
Anion gap: 13 (ref 5–15)
BUN: 13 mg/dL (ref 6–20)
CO2: 27 mmol/L (ref 22–32)
Calcium: 8.3 mg/dL — ABNORMAL LOW (ref 8.9–10.3)
Chloride: 99 mmol/L (ref 98–111)
Creatinine, Ser: 3.44 mg/dL — ABNORMAL HIGH (ref 0.44–1.00)
GFR, Estimated: 15 mL/min — ABNORMAL LOW (ref >60)
Glucose, Bld: 73 mg/dL (ref 70–99)
Potassium: 3.1 mmol/L — ABNORMAL LOW (ref 3.5–5.1)
Sodium: 139 mmol/L (ref 135–145)
Total Bilirubin: 2.1 mg/dL — ABNORMAL HIGH (ref 0.3–1.2)
Total Protein: 5.3 g/dL — ABNORMAL LOW (ref 6.5–8.1)

## 2020-10-06 LAB — LACTIC ACID, PLASMA
Lactic Acid, Venous: 1.7 mmol/L (ref 0.5–1.9)
Lactic Acid, Venous: 2.5 mmol/L (ref 0.5–1.9)
Lactic Acid, Venous: 1 mmol/L (ref 0.5–1.9)
Lactic Acid, Venous: 2.2 mmol/L (ref 0.5–1.9)

## 2020-10-06 LAB — GLUCOSE, CAPILLARY
Glucose-Capillary: 71 mg/dL (ref 70–99)
Glucose-Capillary: 167 mg/dL — ABNORMAL HIGH (ref 70–99)
Glucose-Capillary: 82 mg/dL (ref 70–99)
Glucose-Capillary: 133 mg/dL — ABNORMAL HIGH (ref 70–99)

## 2020-10-06 LAB — T4, FREE: Free T4: 0.93 ng/dL (ref 0.61–1.12)

## 2020-10-06 MED ORDER — VANCOMYCIN HCL 500 MG/100ML IV SOLN
500.0000 mg | Freq: Once | INTRAVENOUS | Status: DC
  Filled 2020-10-06: qty 100

## 2020-10-06 MED ORDER — ASPIRIN 81 MG PO CHEW
81.0000 mg | CHEWABLE_TABLET | Freq: Every day | ORAL | Status: DC
  Administered 2020-10-06 – 2020-10-09 (×4): 81 mg via ORAL
  Filled 2020-10-06 (×4): qty 1

## 2020-10-06 MED ORDER — METRONIDAZOLE IN NACL 5-0.79 MG/ML-% IV SOLN: 500.0000 mg | Freq: Three times a day (TID) | INTRAVENOUS | Status: DC

## 2020-10-06 MED ORDER — DOXERCALCIFEROL 4 MCG/2ML IV SOLN
5.0000 ug | INTRAVENOUS | Status: DC
  Filled 2020-10-06 (×2): qty 4

## 2020-10-06 MED ORDER — DOXERCALCIFEROL 4 MCG/2ML IV SOLN
INTRAVENOUS | Status: AC
  Administered 2020-10-06: 5 ug via INTRAVENOUS
  Filled 2020-10-06: qty 4

## 2020-10-06 NOTE — Progress Notes (Signed)
PROGRESS NOTE    [name] Michael  [id number] DOB: May 03, 1961 DOA: 10/05/2020 PCP: Charlott Rakes, MD   Brief Narrative:  HPI:  [name] Michael is a 60 year old female with medical history significant for ESRD on HD, HTN, HLD who presented from her assisted living facility [facility] due to missed HD sessions for the past week as she has reportedly been unable to complete a fulls session dueto reported worsening weakness and increased oxygen requirements.  History is limited as patient was very slow in speech and provides very minimal context.  Patient was last admitted to [facility] from 3/1-3/3 for acute metabolic encephalopathy presumed secondary to missed HD sessions as well as polypharmacy from home gabapentin, Topamax and Prozac at that time.  Infectious work-up was unremarkable and no neurologic etiology for encephalopathy found CT head and MRI brain at that time.   In the ED patient was found to be afebrile with initial SPO2 of 83% requiring 15 L of oxygen support before downtrending to 3 L and maintaining sat 93%.  Initial blood pressure 206/81, now down trended to 179/87 with heart rate range 59-56.  Laboratory before potassium of 5.2, creatinine 7.6, glucose 143, VBG PO2 of 30, pH 7.3, lactic acid 5.3, platelets 72 (baseline 295).  Chest x-ray showed chronic bronchitic changes with enlargement of cardiac silhouette.  Patient was started empirically on vancomycin, Flagyl, cefepime in the ED due to concern for hidden infection in the setting of elevated lactic acidosis.  [facility] hospitalist service was called for further management.  Assessment & Plan:   Active Problems:   Hyperlipidemia   Essential hypertension   Depression   Chronic diastolic CHF (congestive heart failure) (HCC)   Anemia of chronic disease   ESRD (end stage renal disease) (HCC)   Type II diabetes mellitus (HCC)   Severe tricuspid regurgitation   Acute metabolic encephalopathy   Lactic acidosis    Transaminitis   Thrombocytopenia (HCC)   Acute respiratory failure with hypoxia (HCC)  Increased weakness, unclear etiology.  Patient with no overt neurologic deficits on exam but slow to speak.  Per her nephrologist who is more familiar with her, her current affect seems inconsistent with baseline. CT head stat to ensure no acute neurologic changes.  Ammonia normal.  TSH elevated but free T4 normal.  Her symptoms could very well be due to uremia.  Hope to see improvement after couple of sessions of dialysis.  Acute colitis: CT abdomen and pelvis shows colitis of ascending and proximal transverse colon.  When asked from the patient, she endorses having diarrhea since February.  We will check enteric pathogen panel, C. difficile.  She is on cefepime and vancomycin.  No evidence of MRSA.  Will discontinue vancomycin but continue cefepime and will start her on Flagyl.  Acute encephalopathy, suspect metabolic/uremic.  Patient slightly sleepy but easily arousable.  Engaging in conversations but requires reminder of the questions so she can answer.  Polypharmacy may be contributing.  Hold sedative medications.  CT head negative.  Acute hypoxic respiratory failure, unclear etiology.  Presented with oxygen saturation of 83% requiring 15 L.  Now improved to 3 L nasal cannula which is a new requirement.  However she saturating 99%.  I am optimistic that we can easily wean her down.  Reportedly has had increased O2 requirements at ALF.  Chest x-ray shows no acute disease other than chronic bronchitic changes.  Transthoracic echo with normal ejection fraction and no diastolic dysfunction.  Lactic acidosis: Likely secondary to multiple  reasons, missed hemodialysis, poor hydration and now colitis.  It improved but went up to 2.5 again today.  Repeating later today.  Transaminitis with mild hyperbilirubinemia.  Previous baseline within normal limits, currently AST 135 and ALT 84.  CT abdomen and pelvis did not  show any liver pathology.  LFTs improving.  Thrombocytopenia, acute.  Last platelet count known in March 2020 were normal.  They were down to 93 just 2 weeks ago and now down to 65.  No signs of bleeding.  Wonder if this is due to uremia as well.  Monitor daily.  Hypertensive urgency: Peak blood pressure of 202/80 on admission to the ED. improved from that but still elevated.  Hope that it will improve after hemodialysis session today.  Continue home carvedilol 3.125 mg twice daily, clonidine 0.1 every 8 hrs as well as hydralazine IV as needed.  Anemia of chronic disease: Hemoglobin is stable.  Monitor daily.  Hyperlipidemia: Holding statin due to elevated LFTs.  Depression, stable - Continue home fluoxetine  ESRD on HD.  Has missed several doses of dialysis as outpatient due to reported weakness and reported hypoxia.  Left-sided AV fistula Nephrology on board for dialysis.  Type 2 diabetes Last A1c in March 6.8.  Currently not on any insulin as outpatient: Blood sugar labile.  Continue SSI.  Goals of care discussion. Seen by palliative care as outpatient on 07/2020. At that time she wanted to continue with dialysis and did not want hospice at the time. At that time she wanted to be DNR.  I will reconsult palliative care for continued goals of care conversations.  DVT prophylaxis: SCDs Start: 10/05/20 1701   Code Status: DNR  Family Communication: None present at bedside.  Plan of care discussed with patient   Status is: Inpatient  Remains inpatient appropriate because:Inpatient level of care appropriate due to severity of illness   Dispo: The patient is from: Home              Anticipated d/c is to: Home              Patient currently is not medically stable to d/c.   Difficult to place patient No        Estimated body mass index is 22.38 kg/m as calculated from the following:   Height as of this encounter: '4\' 6"'$  (1.372 m).   Weight as of this encounter: 42.1  kg.      Nutritional status:               Consultants:   Nephrology  Procedures:   None  Antimicrobials:  Anti-infectives (From admission, onward)   Start     Dose/Rate Route Frequency Ordered Stop   10/06/20 0915  metroNIDAZOLE (FLAGYL) IVPB 500 mg  Status:  Discontinued        500 mg 100 mL/hr over 60 Minutes Intravenous Every 8 hours 10/06/20 0826 10/06/20 0834   10/06/20 0030  metroNIDAZOLE (FLAGYL) IVPB 500 mg        500 mg 100 mL/hr over 60 Minutes Intravenous Every 8 hours 10/05/20 1823     10/05/20 2159  vancomycin variable dose per unstable renal function (pharmacist dosing)         Does not apply See admin instructions 10/05/20 2159     10/05/20 1530  ceFEPIme (MAXIPIME) 1 g in sodium chloride 0.9 % 100 mL IVPB        1 g 200 mL/hr over 30 Minutes Intravenous Every 24 hours  10/05/20 1456     10/05/20 1530  vancomycin (VANCOREADY) IVPB 1000 mg/200 mL        1,000 mg 200 mL/hr over 60 Minutes Intravenous  Once 10/05/20 1458 10/06/20 0100   10/05/20 1500  ceFEPIme (MAXIPIME) 2 g in sodium chloride 0.9 % 100 mL IVPB  Status:  Discontinued        2 g 200 mL/hr over 30 Minutes Intravenous  Once 10/05/20 1449 10/05/20 1455   10/05/20 1500  metroNIDAZOLE (FLAGYL) IVPB 500 mg        500 mg 100 mL/hr over 60 Minutes Intravenous  Once 10/05/20 1449 10/05/20 1728   10/05/20 1500  vancomycin (VANCOREADY) IVPB 1000 mg/200 mL  Status:  Discontinued        1,000 mg 200 mL/hr over 60 Minutes Intravenous  Once 10/05/20 1449 10/05/20 1456         Subjective: Patient seen and examined.  Slightly lethargic but easily arousable.  Needs reminders to answer questions.  Feels weak.  No other specific complaint.  Did not endorse any abdominal pain until I asked and then she said that yes she hurts only when palpated.  Endorsed having diarrhea since February.  Objective: Vitals:   10/06/20 0800 10/06/20 0900 10/06/20 1134 10/06/20 1402  BP: (!) 205/83 (!) 171/52 (!)  169/63   Pulse: 63 62 66 66  Resp: '14 11 14 12  '$ Temp:   98.5 F (36.9 C) 98.5 F (36.9 C)  TempSrc:   Oral Oral  SpO2: 99% 100% 100% 99%  Weight:    42.1 kg  Height:        Intake/Output Summary (Last 24 hours) at 10/06/2020 1413 Last data filed at 10/06/2020 0522 Gross per 24 hour  Intake 297.72 ml  Output 1564 ml  Net -1266.28 ml   Filed Weights   10/05/20 2000 10/06/20 1402  Weight: 43.3 kg 42.1 kg    Examination:  General exam: Appears lethargic. Respiratory system: Clear to auscultation. Respiratory effort normal. Cardiovascular system: S1 & S2 heard, RRR. No JVD, murmurs, rubs, gallops or clicks. No pedal edema. Gastrointestinal system: Abdomen is nondistended, soft and generalized tenderness, more around periumbilical area. No organomegaly or masses felt. Normal bowel sounds heard. Central nervous system: Lethargic but easily arousable.  No focal deficit.  Following commands very well.   Data Reviewed: I have personally reviewed following labs and imaging studies  CBC: Recent Labs  Lab 10/05/20 1309 10/05/20 1401 10/05/20 1412 10/06/20 0810  WBC 8.3  --   --  6.6  NEUTROABS 6.3  --   --  4.9  HGB 11.6* 14.6 14.6 10.4*  HCT 39.5 43.0 43.0 34.1*  MCV 104.2*  --   --  100.3*  PLT 72*  --   --  65*   Basic Metabolic Panel: Recent Labs  Lab 10/05/20 1309 10/05/20 1401 10/05/20 1412 10/05/20 2210 10/06/20 0810  NA 141 139 140  --  139  K 5.0 5.3* 5.2*  --  3.1*  CL 100  --  101  --  99  CO2 22  --   --   --  27  GLUCOSE 160*  --  143*  --  73  BUN 48*  --  61*  --  13  CREATININE 8.00*  --  7.60*  --  3.44*  CALCIUM 9.1  --   --   --  8.3*  MG  --   --   --  2.6*  --  GFR: Estimated Creatinine Clearance: 9.9 mL/min (A) (by C-G formula based on SCr of 3.44 mg/dL (H)). Liver Function Tests: Recent Labs  Lab 10/05/20 1309 10/06/20 0810  AST 135* 120*  ALT 84* 81*  ALKPHOS 90 79  BILITOT 2.2* 2.1*  PROT 6.3* 5.3*  ALBUMIN 2.9* 2.4*    Recent Labs  Lab 10/05/20 2210  LIPASE 44   No results for input(s): AMMONIA in the last 168 hours. Coagulation Profile: Recent Labs  Lab 10/05/20 2210  INR 2.1*   Cardiac Enzymes: No results for input(s): CKTOTAL, CKMB, CKMBINDEX, TROPONINI in the last 168 hours. BNP (last 3 results) No results for input(s): PROBNP in the last 8760 hours. HbA1C: No results for input(s): HGBA1C in the last 72 hours. CBG: Recent Labs  Lab 10/05/20 2138 10/06/20 0751 10/06/20 1132  GLUCAP 116* 71 167*   Lipid Profile: No results for input(s): CHOL, HDL, LDLCALC, TRIG, CHOLHDL, LDLDIRECT in the last 72 hours. Thyroid Function Tests: Recent Labs    10/05/20 2210 10/06/20 0936  TSH 15.703*  --   FREET4  --  0.93   Anemia Panel: No results for input(s): VITAMINB12, FOLATE, FERRITIN, TIBC, IRON, RETICCTPCT in the last 72 hours. Sepsis Labs: Recent Labs  Lab 10/05/20 1310 10/05/20 2210 10/06/20 0936 10/06/20 1151  LATICACIDVEN 5.3* 2.5* 1.7 2.5*    Recent Results (from the past 240 hour(s))  Resp Panel by RT-PCR (Flu A&B, Covid) Nasopharyngeal Swab     Status: None   Collection Time: 10/05/20  1:11 PM   Specimen: Nasopharyngeal Swab; Nasopharyngeal(NP) swabs in vial transport medium  Result Value Ref Range Status   SARS Coronavirus 2 by RT PCR NEGATIVE NEGATIVE Final    Comment: (NOTE) SARS-CoV-2 target nucleic acids are NOT DETECTED.  The SARS-CoV-2 RNA is generally detectable in upper respiratory specimens during the acute phase of infection. The lowest concentration of SARS-CoV-2 viral copies this assay can detect is 138 copies/mL. A negative result does not preclude SARS-Cov-2 infection and should not be used as the sole basis for treatment or other patient management decisions. A negative result may occur with  improper specimen collection/handling, submission of specimen other than nasopharyngeal swab, presence of viral mutation(s) within the areas targeted by this  assay, and inadequate number of viral copies(<138 copies/mL). A negative result must be combined with clinical observations, patient history, and epidemiological information. The expected result is Negative.  Fact Sheet for Patients:  EntrepreneurPulse.com.au  Fact Sheet for Healthcare Providers:  IncredibleEmployment.be  This test is no t yet approved or cleared by the [place] FDA and  has been authorized for detection and/or diagnosis of SARS-CoV-2 by FDA under an Emergency Use Authorization (EUA). This EUA will remain  in effect (meaning this test can be used) for the duration of the COVID-19 declaration under Section 564(b)(1) of the Act, 21 U.S.C.section 360bbb-3(b)(1), unless the authorization is terminated  or revoked sooner.       Influenza A by PCR NEGATIVE NEGATIVE Final   Influenza B by PCR NEGATIVE NEGATIVE Final    Comment: (NOTE) The Xpert Xpress SARS-CoV-2/FLU/RSV plus assay is intended as an aid in the diagnosis of influenza from Nasopharyngeal swab specimens and should not be used as a sole basis for treatment. Nasal washings and aspirates are unacceptable for Xpert Xpress SARS-CoV-2/FLU/RSV testing.  Fact Sheet for Patients: EntrepreneurPulse.com.au  Fact Sheet for Healthcare Providers: IncredibleEmployment.be  This test is not yet approved or cleared by the [place] FDA and has been authorized for detection  and/or diagnosis of SARS-CoV-2 by FDA under an Emergency Use Authorization (EUA). This EUA will remain in effect (meaning this test can be used) for the duration of the COVID-19 declaration under Section 564(b)(1) of the Act, 21 U.S.C. section 360bbb-3(b)(1), unless the authorization is terminated or revoked.  Performed at [facility], [place] [address], [place], [place] [address]   Blood Culture (routine x 2)     Status: None (Preliminary result)   Collection  Time: 10/05/20  3:40 PM   Specimen: BLOOD RIGHT FOREARM  Result Value Ref Range Status   Specimen Description BLOOD RIGHT FOREARM  Final   Special Requests   Final    BOTTLES DRAWN AEROBIC AND ANAEROBIC Blood Culture results may not be optimal due to an inadequate volume of blood received in culture bottles   Culture   Final    NO GROWTH < 24 HOURS Performed at [facility], [place] [address] [place], [place] [address]    Report Status PENDING  Incomplete  Blood Culture (routine x 2)     Status: None (Preliminary result)   Collection Time: 10/05/20 10:10 PM   Specimen: BLOOD RIGHT ARM  Result Value Ref Range Status   Specimen Description BLOOD RIGHT ARM  Final   Special Requests   Final    BOTTLES DRAWN AEROBIC AND ANAEROBIC Blood Culture adequate volume   Culture   Final    NO GROWTH < 12 HOURS Performed at [facility], [place] [address], [place],  [address]    Report Status PENDING  Incomplete      Radiology Studies: CT Head Wo Contrast  Result Date: 10/05/2020 CLINICAL DATA:  Altered mental status and weakness EXAM: CT HEAD WITHOUT CONTRAST TECHNIQUE: Contiguous axial images were obtained from the base of the skull through the vertex without intravenous contrast. COMPARISON:  08/21/2020 FINDINGS: Brain: No evidence of acute infarction, hemorrhage, hydrocephalus, extra-axial collection or mass lesion/mass effect. Chronic atrophic and ischemic changes are again noted and stable. 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 Vascular: No hyperdense vessel or unexpected calcification. Skull: Normal. Negative for fracture or focal lesion. Sinuses/Orbits: No acute finding. Other: None. IMPRESSION: Chronic atrophic and ischemic changes without acute intracranial abnormality. Electronically Signed   By: Inez Catalina M.D.   On: 10/05/2020 19:14   US Abdomen Complete  Result Date: 10/05/2020 CLINICAL DATA:  Abdominal pain, elevated liver function tests,  elevated bilirubin EXAM: ABDOMEN ULTRASOUND COMPLETE COMPARISON:  04/28/2019 FINDINGS: Gallbladder: Not visualized. Common bile duct: Diameter: 3 mm Liver: Mild heterogeneous increased liver echotexture is nonspecific, but could reflect hepatic steatosis. No focal liver abnormality. No intrahepatic biliary duct dilation. Portal vein is patent on color Doppler imaging with normal direction of blood flow towards the liver. IVC: Not well visualized due to bowel gas Pancreas: Visualized portion unremarkable. Spleen: Size and appearance within normal limits. Right Kidney: Length: 7.5 cm. Echogenicity within normal limits. No mass or hydronephrosis visualized. Left Kidney: Length: 6.7 cm. Echogenicity within normal limits. No mass or hydronephrosis visualized. Abdominal aorta: Proximal aorta is unremarkable. Distal aorta and bifurcation not visualized due to bowel gas. Other findings: Incidental right pleural effusion. IMPRESSION: 1. Mild heterogeneous increased liver echotexture compatible with hepatic steatosis. 2. Nonvisualization of the gallbladder, likely due to decompressed state. 3. Limited study due to bowel gas. 4. Right pleural effusion. Electronically Signed   By: Randa Ngo M.D.   On: 10/05/2020 18:45   CT ABDOMEN PELVIS W CONTRAST  Result  Date: 10/05/2020 CLINICAL DATA:  Abdominal abscess/infection suspected. Mental status changes. Weakness. Dialysis patient without full dialysis treatment since last Thursday. EXAM: CT ABDOMEN AND PELVIS WITH CONTRAST TECHNIQUE: Multidetector CT imaging of the abdomen and pelvis was performed using the standard protocol following bolus administration of intravenous contrast. CONTRAST:  15m OMNIPAQUE IOHEXOL 300 MG/ML  SOLN COMPARISON:  04/28/2019 FINDINGS: Lower chest: There are bilateral pleural effusions. The heart is enlarged. No pericardial effusion. Hepatobiliary: Small amount of ascites surrounding the liver. Liver is mildly heterogeneous without focal lesion.  Gallbladder is decompressed. Pancreas: Unremarkable. No pancreatic ductal dilatation or surrounding inflammatory changes. Spleen: Normal in size without focal abnormality. Adrenals/Urinary Tract: Normal appearance of both adrenal glands. Small bilateral kidneys. Mild bilateral perinephric nonspecific stranding. There is no hydronephrosis or suspicious renal mass. The ureters are unremarkable. The bladder and visualized portion of the urethra are normal. Stomach/Bowel: Stomach is unremarkable. Small bowel loops are unremarkable. There is thickening of the ascending and proximal transverse colon consistent with colitis. The appendix is well seen and has a normal appearance. Vascular/Lymphatic: There is dense atherosclerotic calcification of the abdominal aorta. Although involved by atherosclerosis, there is vascular opacification of the celiac axis, superior mesenteric artery, and inferior mesenteric artery. Normal appearance of the portal venous system and inferior vena cava. The No retroperitoneal or mesenteric adenopathy. Reproductive: Uterus is present. A 1.5 centimeter low-attenuation lesion is identified within the LEFT ovary. The region of the RIGHT ovary is unremarkable. A small amount of free pelvic fluid. Other: Anterior abdominal wall is unremarkable. Musculoskeletal: No acute or significant osseous findings. IMPRESSION: 1. Thickening of the ascending and proximal transverse colon consistent with colitis, likely infectious or inflammatory. 2. Small amount of ascites. 3. Bilateral pleural effusions. 4. Cardiomegaly. 5. Small bilateral kidneys. 6. 1.5 centimeter low-attenuation lesion within the LEFT ovary, likely a cyst. No follow-up imaging recommended. Note: This recommendation does not apply to premenarchal patients and to those with increased risk (genetic, family history, elevated tumor markers or other high-risk factors) of ovarian cancer. Reference: JACR 2020 Feb; 17(2):248-254 7. Liver is mildly  heterogeneous without focal lesion. 8. Aortic Atherosclerosis (ICD10-I70.0). Electronically Signed   By: ENolon NationsM.D.   On: 10/05/2020 19:23   DG Chest Portable 1 View  Result Date: 10/05/2020 CLINICAL DATA:  Weakness and shortness of breath EXAM: PORTABLE CHEST 1 VIEW COMPARISON:  Portable exam 1323 hours compared to 08/21/2020 FINDINGS: Enlargement of cardiac silhouette. Mediastinal contours and pulmonary vascularity normal. Atherosclerotic calcification aorta. Chronic peribronchial thickening. No pulmonary infiltrate, pleural effusion, or pneumothorax. Eventration RIGHT diaphragm stable. Bones demineralized. IMPRESSION: Chronic bronchitic changes without infiltrate. Enlargement of cardiac silhouette. Aortic Atherosclerosis (ICD10-I70.0). Electronically Signed   By: MLavonia DanaM.D.   On: 10/05/2020 13:51   ECHOCARDIOGRAM COMPLETE  Result Date: 10/05/2020    ECHOCARDIOGRAM REPORT   Patient Name:   DDERICA HEYNENDate of Exam: 10/05/2020 Medical Rec #:  [id number]     Height:       54.0 in Accession #:    [id number]    Weight:       98.0 lb Date of Birth:  [birth year]     BSA:          1.277 m Patient Age:    60 years       BP:           163/53 mmHg Patient Gender: F              HR:  67 bpm. Exam Location:  Inpatient Procedure: 2D Echo Indications:    Cardiomegaly  History:        Patient has prior history of Echocardiogram examinations, most                 recent 12/03/2019. End stage renal disease; Risk                 Factors:Hypertension, Dyslipidemia and Diabetes.  Sonographer:    Johny Chess Referring Phys: [id number] [place]  1. Left ventricular ejection fraction, by estimation, is 50 to 55%. The left ventricle has low normal function. The left ventricle has no regional wall motion abnormalities. Left ventricular diastolic parameters are indeterminate. There is the interventricular septum is flattened in systole and diastole, consistent with right ventricular  pressure and volume overload.  2. Right ventricular systolic function is mildly reduced. The right ventricular size is severely enlarged.  3. Left atrial size was moderately dilated.  4. Right atrial size was moderately dilated.  5. A small pericardial effusion is present.  6. The mitral valve is normal in structure. Mild to moderate mitral valve regurgitation.  7. 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  8. The aortic valve is tricuspid. Aortic valve regurgitation is not visualized. No aortic stenosis is present.  9. The inferior vena cava is dilated in size with <50% respiratory variability, suggesting right atrial pressure of 15 mmHg. Comparison(s): Changes from prior study are noted. EF slightly decreased from prior. Conclusion(s)/Recommendation(s): Severe TR, with non-coaptation of leaflets. This likely causes the RVSP to be inaccurate due to partial equalization of pressures. Ascites also seen on subcostal imaging. FINDINGS  Left Ventricle: Left ventricular ejection fraction, by estimation, is 50 to 55%. The left ventricle has low normal function. The left ventricle has no regional wall motion abnormalities. The left ventricular internal cavity size was normal in size. There is no left ventricular hypertrophy. The interventricular septum is flattened in systole and diastole, consistent with right ventricular pressure and volume overload. Left ventricular diastolic parameters are indeterminate. Right Ventricle: RVSP inaccurate due to possibility of equalization of RV/RA pressure from severe TR. The right ventricular size is severely enlarged. Right vetricular wall thickness was not well visualized. Right ventricular systolic function is mildly reduced. The tricuspid regurgitant velocity is 2.13 m/s, and with an assumed right atrial pressure of 15 mmHg, the estimated right ventricular systolic pressure is [id number] mmHg. Left Atrium: Left atrial size was moderately  dilated. Right Atrium: Right atrial size was moderately dilated. Pericardium: A small pericardial effusion is present. Mitral Valve: The mitral valve is normal in structure. Mild to moderate mitral valve regurgitation. 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 The tricuspid valve is normal in structure. 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 Aortic Valve: The aortic valve is tricuspid. Aortic valve regurgitation is not visualized. No aortic stenosis is present. Pulmonic Valve: The pulmonic valve was not well visualized. Pulmonic valve regurgitation is not visualized. Aorta: The aortic root, ascending aorta and aortic arch are all structurally normal, with no evidence of dilitation or obstruction. Venous: The inferior vena cava is dilated in size with less than 50% respiratory variability, suggesting right atrial pressure of 15 mmHg. IAS/Shunts: The atrial septum is grossly normal. Additional Comments: Moderate ascites is present.  LEFT VENTRICLE PLAX 2D LVIDd:         4.40 cm     Diastology LVIDs:  3.30 cm     LV e' medial:    4.90 cm/s LV PW:         1.00 cm     LV E/e' medial:  14.3 LV IVS:        0.80 cm     LV e' lateral:   7.51 cm/s LVOT diam:     1.50 cm     LV E/e' lateral: 9.4 LV SV:         27 LV SV Index:   21 LVOT Area:     1.77 cm  LV Volumes (MOD) LV vol d, MOD A4C: 69.1 ml LV vol s, MOD A4C: 38.3 ml LV SV MOD A4C:     69.1 ml RIGHT VENTRICLE            IVC RV S prime:     9.36 cm/s  IVC diam: 2.20 cm TAPSE (M-mode): 1.7 cm LEFT ATRIUM             Index       RIGHT ATRIUM           Index LA diam:        3.80 cm 2.97 cm/m  RA Area:     15.10 cm LA Vol (A2C):   43.2 ml 33.82 ml/m RA Volume:   36.10 ml  28.26 ml/m LA Vol (A4C):   39.8 ml 31.16 ml/m LA Biplane Vol: 42.6 ml 33.35 ml/m  AORTIC VALVE LVOT Vmax:   74.40 cm/s LVOT Vmean:  44.800 cm/s LVOT VTI:    0.150 m  AORTA Ao Root diam: 2.50 cm Ao Asc diam:  2.40 cm MITRAL VALVE               TRICUSPID VALVE MV Area  (PHT): 4.60 cm    TR Peak grad:   18.1 mmHg MV Decel Time: 165 msec    TR Vmax:        213.00 cm/s MV E velocity: 70.30 cm/s MV A velocity: 68.60 cm/s  SHUNTS MV E/A ratio:  1.02        Systemic VTI:  0.15 m                            Systemic Diam: 1.50 cm Buford Dresser MD Electronically signed by Buford Dresser MD Signature Date/Time: 10/05/2020/7:59:01 PM    Final     Scheduled Meds: . aspirin  81 mg Oral Daily  . carvedilol  3.125 mg Oral BID WC  . Chlorhexidine Gluconate Cloth  6 each Topical Q0600  . doxercalciferol  5 mcg Intravenous Q T,Th,Sa-HD  . famotidine  10 mg Oral q AM  . FLUoxetine  20 mg Oral Daily  . insulin aspart  0-6 Units Subcutaneous TID WC  . midodrine  10 mg Oral Q T,Th,Sa-HD  . multivitamin with minerals  1 tablet Oral Daily  . pantoprazole  40 mg Oral Daily  . polyethylene glycol  17 g Oral Daily  . sevelamer carbonate  800 mg Oral TID WC  . vancomycin variable dose per unstable renal function (pharmacist dosing)   Does not apply See admin instructions   Continuous Infusions: . sodium chloride    . sodium chloride    . ceFEPime (MAXIPIME) IV 1 g (10/05/20 1747)  . metronidazole 500 mg (10/06/20 0903)     LOS: 1 day   Time spent: 39-minute   Darliss Cheney, MD [facility]  10/06/2020, 2:13 PM  To contact the attending provider between 7A-7P or the covering provider during after hours 7P-7A, please log into the web site [email or web address].

## 2020-10-06 NOTE — Progress Notes (Addendum)
2216 Received critical Lactic acid 2.2 from lab. Notified physician on call.

## 2020-10-06 NOTE — Progress Notes (Signed)
Pt.'s Vancomycin was d/cd and don't need to be given in HD today per pharmacy.

## 2020-10-06 NOTE — Progress Notes (Addendum)
Pharmacy Antibiotic Note  [name] Michael is a 60 y.o. female admitted on 10/05/2020 with sepsis.  Pharmacy is consulted to dose vancomycin and cefepime. Patient with ESRD on HD T/Th/S but has not had a full session PTA since last Thursday (4/7). Patient has now received dialysis 4/15 and 4/16 and she has received her vancomycin loading dose of 1g IV x1.   Patient is also on metronidazole '500mg'$  IV q8h per MD.   Plan: -Continue cefepime 1g IV q24h  -Vancomycin '500mg'$  IV x1 given after hemodialysis 4/16  - Monitor clinical improvement, cultures, and HD schedule  - F/u LOT and de-escalation     Height: '4\' 6"'$  (137.2 cm) Weight: 42.1 kg (92 lb 13 oz) IBW/kg (Calculated) : 31.7  Temp (24hrs), Avg:98.2 F (36.8 C), Min:97.6 F (36.4 C), Max:98.5 F (36.9 C)  Recent Labs  Lab 10/05/20 1309 10/05/20 1310 10/05/20 1412 10/05/20 2210 10/06/20 0810 10/06/20 0936 10/06/20 1151  WBC 8.3  --   --   --  6.6  --   --   CREATININE 8.00*  --  7.60*  --  3.44*  --   --   LATICACIDVEN  --  5.3*  --  2.5*  --  1.7 2.5*    Estimated Creatinine Clearance: 9.9 mL/min (A) (by C-G formula based on SCr of 3.44 mg/dL (H)).    Allergies  Allergen Reactions  . Hydrocodone Nausea And Vomiting and Other (See Comments)    "Allergic," per MAR    Antimicrobials this admission: Metronidazole 4/15 >>  Cefepime 4/15 >>  Vancomycin 4/15 >>   Microbiology results: 4/15 Bcx: sent 4/15 Ucx: sent 4/15 Resp panel: sent   Thank you for allowing pharmacy to be a part of this patient's care. Wilson Singer, PharmD PGY1 Pharmacy Resident 10/07/2020 7:06 AM  Please check [email or web address] for unit-specific pharmacy phone numbers.

## 2020-10-06 NOTE — Progress Notes (Signed)
[facility] Progress Note    Assessment/ Plan:   ESRD: Outpatient HD orders: [facility], TTS. 3hrs 58mn, F180, 350/500, 3K/2.5Ca. Heparin 2k bolus. -Plan for HD again to keep her on her usual TTS schedule  Weakness -etiology unclear, CTH per primary  Encephalopathy -possibly related to inadequate HD vs a possible sepsis picture? -cth 4/15: Chronic atrophic and ischemic changes without acute intracranial abnormality.  Lactic acidosis, rectal pain. Colitis -Ct a/p w/ contrast 4/15 did reveal colitis -per primary service  Transaminitis -statin on hold -w/u per primary service  Hyperkalemia -k 5.2 yesterday, s/p HD, HD again as above  Volume/ hypertension: EDW 43kg. Attempt to achieve EDW as tolerated. Resume home anti-HTNs  Anemia of Chronic Kidney Disease: Hemoglobin 14.6 currently (likely hemoconcentrated). Receiving Mircera 529m, last dose 4/12. Dose recently increased to 7553mto start on 4/19. Hold ESA for now. No need for iron.  Secondary Hyperparathyroidism/Hyperphosphatemia: monitor phos, hectorol 5mc51mtreatment, resume home binders  Vascular access: [name] AVF strong +b/t  DM2 with hyperglycemia -mgmt per primary service  Additional recommendations: - Dose all meds for creatinine clearance <10 ml/min  - Unless absolutely necessary, no MRIs with gadolinium.  - Implement save arm precautions. Prefer needle sticks in the dorsum of the hands or wrists. No blood pressure measurements in arm. - If blood transfusion is requested during hemodialysis sessions, please alert us p[place]or to the session.  - If a hemodialysis catheter line culture is requested, please alert us a[place]only hemodialysis nurses are able to collect those specimens.   Subjective:   Tolerated HD yesterday.  Truncated time due to her history of not tolerating back-to-back HD treatments per her staff.  Net UF 1564 cc.  Open for HD today.  Very drowsy on my encounter this morning  but she does open her eyes and has been nodding or shaking her head to my questions.  When asked about pain and shortness of breath she shook her head no but immediately went back to sleep.   Objective:   BP (!) 147/111 (BP Location: Right Arm)   Pulse 90   Temp 97.6 F (36.4 C) (Oral)   Resp 16   Ht '4\' 6"'$  (1.372 m)   Wt 43.3 kg   LMP 03/06/2013 (LMP Unknown)   SpO2 100%   BMI 23.02 kg/m   Intake/Output Summary (Last 24 hours) at 10/06/2020 0749 Last data filed at 10/06/2020 0522 Gross per 24 hour  Intake 297.72 ml  Output 1564 ml  Net -1266.28 ml   Weight change:   Physical Exam: Gen: Chronically ill-appearing, drowsy CVS: [id number] murmur Resp: CTA BL Abd: Nondistended Ext: No edema Neuro: Very drowsy but opens eyes to vocal and tactile stimuli Dialysis access: Left upper extremity AV fistula+b/t  Imaging: CT Head Wo Contrast  Result Date: 10/05/2020 CLINICAL DATA:  Altered mental status and weakness EXAM: CT HEAD WITHOUT CONTRAST TECHNIQUE: Contiguous axial images were obtained from the base of the skull through the vertex without intravenous contrast. COMPARISON:  08/21/2020 FINDINGS: Brain: No evidence of acute infarction, hemorrhage, hydrocephalus, extra-axial collection or mass lesion/mass effect. Chronic atrophic and ischemic changes are again noted and stable. 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 Vascular: No hyperdense vessel or unexpected calcification. Skull: Normal. Negative for fracture or focal lesion. Sinuses/Orbits: No acute finding. Other: None. IMPRESSION: Chronic atrophic and ischemic changes without acute intracranial abnormality. Electronically Signed   By: MarkInez Catalina.   On: 10/05/2020 19:14   US [place] Complete  Result Date: 10/05/2020 CLINICAL DATA:  Abdominal pain, elevated liver function tests, elevated bilirubin EXAM: ABDOMEN ULTRASOUND COMPLETE COMPARISON:  04/28/2019 FINDINGS: Gallbladder: Not visualized.  Common bile duct: Diameter: 3 mm Liver: Mild heterogeneous increased liver echotexture is nonspecific, but could reflect hepatic steatosis. No focal liver abnormality. No intrahepatic biliary duct dilation. Portal vein is patent on color Doppler imaging with normal direction of blood flow towards the liver. IVC: Not well visualized due to bowel gas Pancreas: Visualized portion unremarkable. Spleen: Size and appearance within normal limits. Right Kidney: Length: 7.5 cm. Echogenicity within normal limits. No mass or hydronephrosis visualized. Left Kidney: Length: 6.7 cm. Echogenicity within normal limits. No mass or hydronephrosis visualized. Abdominal aorta: Proximal aorta is unremarkable. Distal aorta and bifurcation not visualized due to bowel gas. Other findings: Incidental right pleural effusion. IMPRESSION: 1. Mild heterogeneous increased liver echotexture compatible with hepatic steatosis. 2. Nonvisualization of the gallbladder, likely due to decompressed state. 3. Limited study due to bowel gas. 4. Right pleural effusion. Electronically Signed   By: Randa Ngo M.D.   On: 10/05/2020 18:45   CT ABDOMEN PELVIS W CONTRAST  Result Date: 10/05/2020 CLINICAL DATA:  Abdominal abscess/infection suspected. Mental status changes. Weakness. Dialysis patient without full dialysis treatment since last Thursday. EXAM: CT ABDOMEN AND PELVIS WITH CONTRAST TECHNIQUE: Multidetector CT imaging of the abdomen and pelvis was performed using the standard protocol following bolus administration of intravenous contrast. CONTRAST:  79m OMNIPAQUE IOHEXOL 300 MG/ML  SOLN COMPARISON:  04/28/2019 FINDINGS: Lower chest: There are bilateral pleural effusions. The heart is enlarged. No pericardial effusion. Hepatobiliary: Small amount of ascites surrounding the liver. Liver is mildly heterogeneous without focal lesion. Gallbladder is decompressed. Pancreas: Unremarkable. No pancreatic ductal dilatation or surrounding inflammatory  changes. Spleen: Normal in size without focal abnormality. Adrenals/Urinary Tract: Normal appearance of both adrenal glands. Small bilateral kidneys. Mild bilateral perinephric nonspecific stranding. There is no hydronephrosis or suspicious renal mass. The ureters are unremarkable. The bladder and visualized portion of the urethra are normal. Stomach/Bowel: Stomach is unremarkable. Small bowel loops are unremarkable. There is thickening of the ascending and proximal transverse colon consistent with colitis. The appendix is well seen and has a normal appearance. Vascular/Lymphatic: There is dense atherosclerotic calcification of the abdominal aorta. Although involved by atherosclerosis, there is vascular opacification of the celiac axis, superior mesenteric artery, and inferior mesenteric artery. Normal appearance of the portal venous system and inferior vena cava. The No retroperitoneal or mesenteric adenopathy. Reproductive: Uterus is present. A 1.5 centimeter low-attenuation lesion is identified within the LEFT ovary. The region of the RIGHT ovary is unremarkable. A small amount of free pelvic fluid. Other: Anterior abdominal wall is unremarkable. Musculoskeletal: No acute or significant osseous findings. IMPRESSION: 1. Thickening of the ascending and proximal transverse colon consistent with colitis, likely infectious or inflammatory. 2. Small amount of ascites. 3. Bilateral pleural effusions. 4. Cardiomegaly. 5. Small bilateral kidneys. 6. 1.5 centimeter low-attenuation lesion within the LEFT ovary, likely a cyst. No follow-up imaging recommended. Note: This recommendation does not apply to premenarchal patients and to those with increased risk (genetic, family history, elevated tumor markers or other high-risk factors) of ovarian cancer. Reference: JACR 2020 Feb; 17(2):248-254 7. Liver is mildly heterogeneous without focal lesion. 8. Aortic Atherosclerosis (ICD10-I70.0). Electronically Signed   By: ENolon NationsM.D.   On: 10/05/2020 19:23   DG Chest Portable 1 View  Result Date: 10/05/2020 CLINICAL DATA:  Weakness and shortness of breath EXAM: PORTABLE CHEST 1 VIEW COMPARISON:  Portable exam 1323 hours compared to 08/21/2020 FINDINGS: Enlargement of cardiac silhouette. Mediastinal contours and pulmonary vascularity normal. Atherosclerotic calcification aorta. Chronic peribronchial thickening. No pulmonary infiltrate, pleural effusion, or pneumothorax. Eventration RIGHT diaphragm stable. Bones demineralized. IMPRESSION: Chronic bronchitic changes without infiltrate. Enlargement of cardiac silhouette. Aortic Atherosclerosis (ICD10-I70.0). Electronically Signed   By: Lavonia Dana M.D.   On: 10/05/2020 13:51   ECHOCARDIOGRAM COMPLETE  Result Date: 10/05/2020    ECHOCARDIOGRAM REPORT   Patient Name:   [name] Michael Date of Exam: 10/05/2020 Medical Rec #:  [id number]      Height:       54.0 in Accession #:    [id number]     Weight:       98.0 lb Date of Birth:  30-Jun-1960      BSA:          1.277 m Patient Age:    60 years       BP:           163/53 mmHg Patient Gender: F              HR:           67 bpm. Exam Location:  Inpatient Procedure: 2D Echo Indications:    Cardiomegaly  History:        Patient has prior history of Echocardiogram examinations, most                 recent 12/03/2019. End stage renal disease; Risk                 Factors:Hypertension, Dyslipidemia and Diabetes.  Sonographer:    Johny Chess Referring Phys: [id number] Ramona  1. Left ventricular ejection fraction, by estimation, is 50 to 55%. The left ventricle has low normal function. The left ventricle has no regional wall motion abnormalities. Left ventricular diastolic parameters are indeterminate. There is the interventricular septum is flattened in systole and diastole, consistent with right ventricular pressure and volume overload.  2. Right ventricular systolic function is mildly reduced. The right ventricular size  is severely enlarged.  3. Left atrial size was moderately dilated.  4. Right atrial size was moderately dilated.  5. A small pericardial effusion is present.  6. The mitral valve is normal in structure. Mild to moderate mitral valve regurgitation.  7. 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  8. The aortic valve is tricuspid. Aortic valve regurgitation is not visualized. No aortic stenosis is present.  9. The inferior vena cava is dilated in size with <50% respiratory variability, suggesting right atrial pressure of 15 mmHg. Comparison(s): Changes from prior study are noted. EF slightly decreased from prior. Conclusion(s)/Recommendation(s): Severe TR, with non-coaptation of leaflets. This likely causes the RVSP to be inaccurate due to partial equalization of pressures. Ascites also seen on subcostal imaging. FINDINGS  Left Ventricle: Left ventricular ejection fraction, by estimation, is 50 to 55%. The left ventricle has low normal function. The left ventricle has no regional wall motion abnormalities. The left ventricular internal cavity size was normal in size. There is no left ventricular hypertrophy. The interventricular septum is flattened in systole and diastole, consistent with right ventricular pressure and volume overload. Left ventricular diastolic parameters are indeterminate. Right Ventricle: RVSP inaccurate due to possibility of equalization of RV/RA pressure from severe TR. The right ventricular size is severely enlarged. Right vetricular wall thickness was not well visualized. Right ventricular systolic function is mildly reduced.  The tricuspid regurgitant velocity is 2.13 m/s, and with an assumed right atrial pressure of 15 mmHg, the estimated right ventricular systolic pressure is [id number] mmHg. Left Atrium: Left atrial size was moderately dilated. Right Atrium: Right atrial size was moderately dilated. Pericardium: A small pericardial effusion is  present. Mitral Valve: The mitral valve is normal in structure. Mild to moderate mitral valve regurgitation. 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 The tricuspid valve is normal in structure. 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 Aortic Valve: The aortic valve is tricuspid. Aortic valve regurgitation is not visualized. No aortic stenosis is present. Pulmonic Valve: The pulmonic valve was not well visualized. Pulmonic valve regurgitation is not visualized. Aorta: The aortic root, ascending aorta and aortic arch are all structurally normal, with no evidence of dilitation or obstruction. Venous: The inferior vena cava is dilated in size with less than 50% respiratory variability, suggesting right atrial pressure of 15 mmHg. IAS/Shunts: The atrial septum is grossly normal. Additional Comments: Moderate ascites is present.  LEFT VENTRICLE PLAX 2D LVIDd:         4.40 cm     Diastology LVIDs:         3.30 cm     LV e' medial:    4.90 cm/s LV PW:         1.00 cm     LV E/e' medial:  14.3 LV IVS:        0.80 cm     LV e' lateral:   7.51 cm/s LVOT diam:     1.50 cm     LV E/e' lateral: 9.4 LV SV:         27 LV SV Index:   21 LVOT Area:     1.77 cm  LV Volumes (MOD) LV vol d, MOD A4C: 69.1 ml LV vol s, MOD A4C: 38.3 ml LV SV MOD A4C:     69.1 ml RIGHT VENTRICLE            IVC RV S prime:     9.36 cm/s  IVC diam: 2.20 cm TAPSE (M-mode): 1.7 cm LEFT ATRIUM             Index       RIGHT ATRIUM           Index LA diam:        3.80 cm 2.97 cm/m  RA Area:     15.10 cm LA Vol (A2C):   43.2 ml 33.82 ml/m RA Volume:   36.10 ml  28.26 ml/m LA Vol (A4C):   39.8 ml 31.16 ml/m LA Biplane Vol: 42.6 ml 33.35 ml/m  AORTIC VALVE LVOT Vmax:   74.40 cm/s LVOT Vmean:  44.800 cm/s LVOT VTI:    0.150 m  AORTA Ao Root diam: 2.50 cm Ao Asc diam:  2.40 cm MITRAL VALVE               TRICUSPID VALVE MV Area (PHT): 4.60 cm    TR Peak grad:   18.1 mmHg MV Decel Time: 165 msec    TR Vmax:        213.00 cm/s MV E  velocity: 70.30 cm/s MV A velocity: 68.60 cm/s  SHUNTS MV E/A ratio:  1.02        Systemic VTI:  0.15 m                            Systemic Diam: 1.50  cm Buford Dresser MD Electronically signed by Buford Dresser MD Signature Date/Time: 10/05/2020/7:59:01 PM    Final     Labs: BMET Recent Labs  Lab 10/05/20 1309 10/05/20 1401 10/05/20 1412  NA 141 139 140  K 5.0 5.3* 5.2*  CL 100  --  101  CO2 22  --   --   GLUCOSE 160*  --  143*  BUN 48*  --  61*  CREATININE 8.00*  --  7.60*  CALCIUM 9.1  --   --    CBC Recent Labs  Lab 10/05/20 1309 10/05/20 1401 10/05/20 1412  WBC 8.3  --   --   NEUTROABS 6.3  --   --   HGB 11.6* 14.6 14.6  HCT 39.5 43.0 43.0  MCV 104.2*  --   --   PLT 72*  --   --     Medications:    . carvedilol  3.125 mg Oral BID WC  . Chlorhexidine Gluconate Cloth  6 each Topical Q0600  . famotidine  10 mg Oral q AM  . FLUoxetine  20 mg Oral Daily  . insulin aspart  0-6 Units Subcutaneous TID WC  . midodrine  10 mg Oral Q T,Th,Sa-HD  . multivitamin with minerals  1 tablet Oral Daily  . pantoprazole  40 mg Oral Daily  . polyethylene glycol  17 g Oral Daily  . sevelamer carbonate  800 mg Oral TID WC  . vancomycin variable dose per unstable renal function (pharmacist dosing)   Does not apply See admin instructions      Gean Quint, MD [facility] 10/06/2020, 7:49 AM

## 2020-10-07 DIAGNOSIS — Z515 Encounter for palliative care: Secondary | ICD-10-CM

## 2020-10-07 DIAGNOSIS — R6520 Severe sepsis without septic shock: Secondary | ICD-10-CM

## 2020-10-07 DIAGNOSIS — A419 Sepsis, unspecified organism: Secondary | ICD-10-CM

## 2020-10-07 DIAGNOSIS — Z7189 Other specified counseling: Secondary | ICD-10-CM

## 2020-10-07 LAB — COMPREHENSIVE METABOLIC PANEL
ALT: 72 U/L — ABNORMAL HIGH (ref 0–44)
AST: 93 U/L — ABNORMAL HIGH (ref 15–41)
Albumin: 2.2 g/dL — ABNORMAL LOW (ref 3.5–5.0)
Alkaline Phosphatase: 77 U/L (ref 38–126)
Anion gap: 8 (ref 5–15)
BUN: <5 mg/dL — ABNORMAL LOW (ref 6–20)
CO2: 29 mmol/L (ref 22–32)
Calcium: 8.4 mg/dL — ABNORMAL LOW (ref 8.9–10.3)
Chloride: 99 mmol/L (ref 98–111)
Creatinine, Ser: 1.82 mg/dL — ABNORMAL HIGH (ref 0.44–1.00)
GFR, Estimated: 31 mL/min — ABNORMAL LOW (ref >60)
Glucose, Bld: 120 mg/dL — ABNORMAL HIGH (ref 70–99)
Potassium: 2.7 mmol/L — CL (ref 3.5–5.1)
Sodium: 136 mmol/L (ref 135–145)
Total Bilirubin: 1.9 mg/dL — ABNORMAL HIGH (ref 0.3–1.2)
Total Protein: 5 g/dL — ABNORMAL LOW (ref 6.5–8.1)

## 2020-10-07 LAB — GASTROINTESTINAL PANEL BY PCR, STOOL (REPLACES STOOL CULTURE)

## 2020-10-07 LAB — CBC WITH DIFFERENTIAL/PLATELET
Abs Immature Granulocytes: 0.03 10*3/uL (ref 0.00–0.07)
Basophils Absolute: 0 10*3/uL (ref 0.0–0.1)
Basophils Relative: 1 %
Eosinophils Absolute: 0.1 10*3/uL (ref 0.0–0.5)
Eosinophils Relative: 1 %
HCT: 33.5 % — ABNORMAL LOW (ref 36.0–46.0)
Hemoglobin: 10.4 g/dL — ABNORMAL LOW (ref 12.0–15.0)
Immature Granulocytes: 1 %
Lymphocytes Relative: 19 %
Lymphs Abs: 0.8 10*3/uL (ref 0.7–4.0)
MCH: 29.9 pg (ref 26.0–34.0)
MCHC: 31 g/dL (ref 30.0–36.0)
MCV: 96.3 fL (ref 80.0–100.0)
Monocytes Absolute: 0.5 10*3/uL (ref 0.1–1.0)
Monocytes Relative: 13 %
Neutro Abs: 2.7 10*3/uL (ref 1.7–7.7)
Neutrophils Relative %: 65 %
Platelets: 60 10*3/uL — ABNORMAL LOW (ref 150–400)
RBC: 3.48 MIL/uL — ABNORMAL LOW (ref 3.87–5.11)
RDW: 19.1 % — ABNORMAL HIGH (ref 11.5–15.5)
WBC: 4.1 10*3/uL (ref 4.0–10.5)
nRBC: 1 % — ABNORMAL HIGH (ref 0.0–0.2)

## 2020-10-07 LAB — MAGNESIUM: Magnesium: 1.8 mg/dL (ref 1.7–2.4)

## 2020-10-07 LAB — C DIFFICILE QUICK SCREEN W PCR REFLEX
C Diff antigen: NEGATIVE
C Diff interpretation: NOT DETECTED
C Diff toxin: NEGATIVE

## 2020-10-07 LAB — LACTIC ACID, PLASMA: Lactic Acid, Venous: 1.8 mmol/L (ref 0.5–1.9)

## 2020-10-07 LAB — GLUCOSE, CAPILLARY
Glucose-Capillary: 128 mg/dL — ABNORMAL HIGH (ref 70–99)
Glucose-Capillary: 129 mg/dL — ABNORMAL HIGH (ref 70–99)
Glucose-Capillary: 215 mg/dL — ABNORMAL HIGH (ref 70–99)

## 2020-10-07 MED ORDER — HYDRALAZINE HCL 20 MG/ML IJ SOLN
10.0000 mg | Freq: Four times a day (QID) | INTRAMUSCULAR | Status: DC | PRN
  Administered 2020-10-09: 10 mg via INTRAVENOUS
  Filled 2020-10-07: qty 1

## 2020-10-07 MED ORDER — CHLORHEXIDINE GLUCONATE CLOTH 2 % EX PADS
6.0000 | MEDICATED_PAD | Freq: Every day | CUTANEOUS | Status: DC
  Administered 2020-10-08 – 2020-10-09 (×2): 6 via TOPICAL

## 2020-10-07 MED ORDER — POTASSIUM CHLORIDE 20 MEQ PO PACK
40.0000 meq | PACK | Freq: Once | ORAL | Status: AC
  Administered 2020-10-07: 40 meq via ORAL
  Filled 2020-10-07: qty 2

## 2020-10-07 MED ORDER — CARVEDILOL 6.25 MG PO TABS
6.2500 mg | ORAL_TABLET | Freq: Two times a day (BID) | ORAL | Status: DC
  Administered 2020-10-07 – 2020-10-09 (×5): 6.25 mg via ORAL
  Filled 2020-10-07 (×7): qty 1

## 2020-10-07 MED ORDER — GERHARDT'S BUTT CREAM
TOPICAL_CREAM | Freq: Four times a day (QID) | CUTANEOUS | Status: DC | PRN
  Administered 2020-10-08: 1 via TOPICAL
  Filled 2020-10-07: qty 1

## 2020-10-07 MED ORDER — GERHARDT'S BUTT CREAM
TOPICAL_CREAM | Freq: Four times a day (QID) | CUTANEOUS | Status: DC
  Filled 2020-10-07: qty 1

## 2020-10-07 MED ORDER — LOPERAMIDE HCL 2 MG PO CAPS
4.0000 mg | ORAL_CAPSULE | ORAL | Status: DC | PRN
  Administered 2020-10-07: 4 mg via ORAL
  Filled 2020-10-07: qty 2

## 2020-10-07 NOTE — Progress Notes (Addendum)
Lab called a critical Potassium of 2.7 Dr Marlowe Sax notified  [phone] Dr Marlowe Sax returned call with no new treatment orders at this time, will cont to monitor.

## 2020-10-07 NOTE — Progress Notes (Signed)
0543 B/P outside of parameters @ 178/93, Hydralazine '5mg'$  IVP given at this time and will recheck B/P in 30 minutes. Dr Marlowe Sax was notified.

## 2020-10-07 NOTE — Progress Notes (Signed)
PROGRESS NOTE    [name] Michael  [id number] DOB: 12/14/1960 DOA: 10/05/2020 PCP: Charlott Rakes, MD   Brief Narrative:  HPI:  Ms. Gridley is a 60 year old female with medical history significant for ESRD on HD, HTN, HLD who presented from her assisted living facility [facility] due to missed HD sessions for the past week as she has reportedly been unable to complete a fulls session dueto reported worsening weakness and increased oxygen requirements.  History is limited as patient was very slow in speech and provides very minimal context.  Patient was last admitted to [facility] from 3/1-3/3 for acute metabolic encephalopathy presumed secondary to missed HD sessions as well as polypharmacy from home gabapentin, Topamax and Prozac at that time.  Infectious work-up was unremarkable and no neurologic etiology for encephalopathy found CT head and MRI brain at that time.   In the ED patient was found to be afebrile with initial SPO2 of 83% requiring 15 L of oxygen support before downtrending to 3 L and maintaining sat 93%.  Initial blood pressure 206/81, now down trended to 179/87 with heart rate range 59-56.  Laboratory before potassium of 5.2, creatinine 7.6, glucose 143, VBG PO2 of 30, pH 7.3, lactic acid 5.3, platelets 72 (baseline 295).  Chest x-ray showed chronic bronchitic changes with enlargement of cardiac silhouette.  Patient was started empirically on vancomycin, Flagyl, cefepime in the ED due to concern for hidden infection in the setting of elevated lactic acidosis.  [facility] hospitalist service was called for further management.  Assessment & Plan:   Active Problems:   Hyperlipidemia   Essential hypertension   Depression   Chronic diastolic CHF (congestive heart failure) (HCC)   Anemia of chronic disease   ESRD (end stage renal disease) (HCC)   Type II diabetes mellitus (HCC)   Severe tricuspid regurgitation   Acute metabolic encephalopathy   Lactic acidosis    Transaminitis   Thrombocytopenia (HCC)   Acute respiratory failure with hypoxia (HCC)  Increased weakness, unclear etiology.  Patient with no overt neurologic deficits on exam but slow to speak.  Per her nephrologist who is more familiar with her, her current affect seems inconsistent with baseline. CT head stat to ensure no acute neurologic changes.  Ammonia normal.  TSH elevated but free T4 normal.  Her symptoms could very well be due to uremia and patient has improved significantly today after her dialysis yesterday.  Acute colitis: CT abdomen and pelvis shows colitis of ascending and proximal transverse colon.  When asked from the patient, she endorses having diarrhea since February.  C. difficile negative.  GI pathogen panel pending.  Continue cefepime and Flagyl.  Acute encephalopathy, suspect metabolic/uremic.  Patient has improved significantly.  She is talkative and alert and oriented.  Acute hypoxic respiratory failure, unclear etiology.  Presented with oxygen saturation of 83% requiring 15 L.  Significant improvement.  She is on room air now saturating 98% per chart.  Lactic acidosis: Likely secondary to multiple reasons, missed hemodialysis, poor hydration and now colitis.  Resolved.  Transaminitis with mild hyperbilirubinemia.  Previous baseline within normal limits, currently AST 135 and ALT 84.  CT abdomen and pelvis did not show any liver pathology.  LFTs improving.  Thrombocytopenia, acute.  Last platelet count known in March 2020 were normal.  They were down to 93 just 2 weeks ago and now down to 60.  No signs of bleeding.  Wonder if this is due to uremia as well.  Monitor daily.  Hypertensive  urgency: Peak blood pressure of 202/80 on admission to the ED. was much better yesterday but elevated again today.  Will increase home dose of carvedilol 3.125 mg twice daily to 6.125 p.o. twice daily and continue as needed clonidine 0.1 every 8 hrs as well as hydralazine IV as  needed.  Anemia of chronic disease: Hemoglobin is stable.  Monitor daily.  Hyperlipidemia: Holding statin due to elevated LFTs.  Depression, stable - Continue home fluoxetine  ESRD on HD.  Has missed several doses of dialysis as outpatient due to reported weakness and reported hypoxia.  Left-sided AV fistula Nephrology on board for dialysis.  Hypokalemia: Potassium 2.7.  Potassium replaced by nephrology.  Type 2 diabetes Last A1c in March 6.8.  Currently not on any insulin as outpatient: Blood sugar labile.  Continue SSI.  Goals of care discussion. Seen by palliative care as outpatient on 07/2020. At that time she wanted to continue with dialysis and did not want hospice at the time. At that time she wanted to be DNR.  Palliative care consulted, pending evaluation.  DVT prophylaxis: SCDs Start: 10/05/20 1701   Code Status: DNR  Family Communication: None present at bedside.  Plan of care discussed with patient   Status is: Inpatient  Remains inpatient appropriate because:Inpatient level of care appropriate due to severity of illness   Dispo: The patient is from: Home              Anticipated d/c is to: Home              Patient currently is not medically stable to d/c.   Difficult to place patient No        Estimated body mass index is 21.53 kg/m as calculated from the following:   Height as of this encounter: '4\' 6"'$  (1.372 m).   Weight as of this encounter: 40.5 kg.      Nutritional status:               Consultants:   Nephrology  Procedures:   None  Antimicrobials:  Anti-infectives (From admission, onward)   Start     Dose/Rate Route Frequency Ordered Stop   10/06/20 1800  vancomycin (VANCOREADY) IVPB 500 mg/100 mL  Status:  Discontinued        500 mg 100 mL/hr over 60 Minutes Intravenous  Once 10/06/20 1414 10/06/20 1427   10/06/20 0915  metroNIDAZOLE (FLAGYL) IVPB 500 mg  Status:  Discontinued        500 mg 100 mL/hr over 60 Minutes  Intravenous Every 8 hours 10/06/20 0826 10/06/20 0834   10/06/20 0030  metroNIDAZOLE (FLAGYL) IVPB 500 mg        500 mg 100 mL/hr over 60 Minutes Intravenous Every 8 hours 10/05/20 1823     10/05/20 2159  vancomycin variable dose per unstable renal function (pharmacist dosing)  Status:  Discontinued         Does not apply See admin instructions 10/05/20 2159 10/06/20 1427   10/05/20 1530  ceFEPIme (MAXIPIME) 1 g in sodium chloride 0.9 % 100 mL IVPB        1 g 200 mL/hr over 30 Minutes Intravenous Every 24 hours 10/05/20 1456     10/05/20 1530  vancomycin (VANCOREADY) IVPB 1000 mg/200 mL        1,000 mg 200 mL/hr over 60 Minutes Intravenous  Once 10/05/20 1458 10/06/20 0100   10/05/20 1500  ceFEPIme (MAXIPIME) 2 g in sodium chloride 0.9 % 100 mL IVPB  Status:  Discontinued        2 g 200 mL/hr over 30 Minutes Intravenous  Once 10/05/20 1449 10/05/20 1455   10/05/20 1500  metroNIDAZOLE (FLAGYL) IVPB 500 mg        500 mg 100 mL/hr over 60 Minutes Intravenous  Once 10/05/20 1449 10/05/20 1728   10/05/20 1500  vancomycin (VANCOREADY) IVPB 1000 mg/200 mL  Status:  Discontinued        1,000 mg 200 mL/hr over 60 Minutes Intravenous  Once 10/05/20 1449 10/05/20 1456         Subjective: Patient seen and examined.  Much more alert and talkative.  No complaints.  Objective: Vitals:   10/07/20 0003 10/07/20 0425 10/07/20 0653 10/07/20 0800  BP: (!) 170/70 (!) 178/93 (!) (P) 156/55 (!) 181/61  Pulse:    75  Resp:    17  Temp: 98.4 F (36.9 C) 98.4 F (36.9 C)  97.9 F (36.6 C)  TempSrc: Oral Oral  Oral  SpO2: 90%   98%  Weight:      Height:        Intake/Output Summary (Last 24 hours) at 10/07/2020 1039 Last data filed at 10/06/2020 1755 Gross per 24 hour  Intake --  Output 1000 ml  Net -1000 ml   Filed Weights   10/05/20 2000 10/06/20 1402 10/06/20 1755  Weight: 43.3 kg 42.1 kg 40.5 kg    Examination:  General exam: Appears calm and comfortable  Respiratory system:  Diminished breath sounds bilaterally. Respiratory effort normal. Cardiovascular system: S1 & S2 heard, RRR. No JVD, murmurs, rubs, gallops or clicks. No pedal edema. Gastrointestinal system: Abdomen is nondistended, soft and mild periumbilical tenderness. No organomegaly or masses felt. Normal bowel sounds heard. Central nervous system: Alert and oriented. No focal neurological deficits. Extremities: Symmetric 5 x 5 power. Skin: No rashes, lesions or ulcers.  Psychiatry: Judgement and insight appear poor  Data Reviewed: I have personally reviewed following labs and imaging studies  CBC: Recent Labs  Lab 10/05/20 1309 10/05/20 1401 10/05/20 1412 10/06/20 0810 10/07/20 0414  WBC 8.3  --   --  6.6 4.1  NEUTROABS 6.3  --   --  4.9 2.7  HGB 11.6* 14.6 14.6 10.4* 10.4*  HCT 39.5 43.0 43.0 34.1* 33.5*  MCV 104.2*  --   --  100.3* 96.3  PLT 72*  --   --  65* 60*   Basic Metabolic Panel: Recent Labs  Lab 10/05/20 1309 10/05/20 1401 10/05/20 1412 10/05/20 2210 10/06/20 0810 10/07/20 0414  NA 141 139 140  --  139 136  K 5.0 5.3* 5.2*  --  3.1* 2.7*  CL 100  --  101  --  99 99  CO2 22  --   --   --  27 29  GLUCOSE 160*  --  143*  --  73 120*  BUN 48*  --  61*  --  13 <5*  CREATININE 8.00*  --  7.60*  --  3.44* 1.82*  CALCIUM 9.1  --   --   --  8.3* 8.4*  MG  --   --   --  2.6*  --  1.8   GFR: Estimated Creatinine Clearance: 18.3 mL/min (A) (by C-G formula based on SCr of 1.82 mg/dL (H)). Liver Function Tests: Recent Labs  Lab 10/05/20 1309 10/06/20 0810 10/07/20 0414  AST 135* 120* 93*  ALT 84* 81* 72*  ALKPHOS 90 79 77  BILITOT 2.2* 2.1* 1.9*  PROT  6.3* 5.3* 5.0*  ALBUMIN 2.9* 2.4* 2.2*   Recent Labs  Lab 10/05/20 2210  LIPASE 44   No results for input(s): AMMONIA in the last 168 hours. Coagulation Profile: Recent Labs  Lab 10/05/20 2210  INR 2.1*   Cardiac Enzymes: No results for input(s): CKTOTAL, CKMB, CKMBINDEX, TROPONINI in the last 168 hours. BNP  (last 3 results) No results for input(s): PROBNP in the last 8760 hours. HbA1C: No results for input(s): HGBA1C in the last 72 hours. CBG: Recent Labs  Lab 10/06/20 0751 10/06/20 1132 10/06/20 1835 10/06/20 2031 10/07/20 0815  GLUCAP 71 167* 82 133* 128*   Lipid Profile: No results for input(s): CHOL, HDL, LDLCALC, TRIG, CHOLHDL, LDLDIRECT in the last 72 hours. Thyroid Function Tests: Recent Labs    10/05/20 2210 10/06/20 0936  TSH 15.703*  --   FREET4  --  0.93   Anemia Panel: No results for input(s): VITAMINB12, FOLATE, FERRITIN, TIBC, IRON, RETICCTPCT in the last 72 hours. Sepsis Labs: Recent Labs  Lab 10/06/20 1151 10/06/20 1422 10/06/20 2116 10/07/20 0414  LATICACIDVEN 2.5* 1.0 2.2* 1.8    Recent Results (from the past 240 hour(s))  Resp Panel by RT-PCR (Flu A&B, Covid) Nasopharyngeal Swab     Status: None   Collection Time: 10/05/20  1:11 PM   Specimen: Nasopharyngeal Swab; Nasopharyngeal(NP) swabs in vial transport medium  Result Value Ref Range Status   SARS Coronavirus 2 by RT PCR NEGATIVE NEGATIVE Final    Comment: (NOTE) SARS-CoV-2 target nucleic acids are NOT DETECTED.  The SARS-CoV-2 RNA is generally detectable in upper respiratory specimens during the acute phase of infection. The lowest concentration of SARS-CoV-2 viral copies this assay can detect is 138 copies/mL. A negative result does not preclude SARS-Cov-2 infection and should not be used as the sole basis for treatment or other patient management decisions. A negative result may occur with  improper specimen collection/handling, submission of specimen other than nasopharyngeal swab, presence of viral mutation(s) within the areas targeted by this assay, and inadequate number of viral copies(<138 copies/mL). A negative result must be combined with clinical observations, patient history, and epidemiological information. The expected result is Negative.  Fact Sheet for Patients:   EntrepreneurPulse.com.au  Fact Sheet for Healthcare Providers:  IncredibleEmployment.be  This test is no t yet approved or cleared by the [place] FDA and  has been authorized for detection and/or diagnosis of SARS-CoV-2 by FDA under an Emergency Use Authorization (EUA). This EUA will remain  in effect (meaning this test can be used) for the duration of the COVID-19 declaration under Section 564(b)(1) of the Act, 21 U.S.C.section 360bbb-3(b)(1), unless the authorization is terminated  or revoked sooner.       Influenza A by PCR NEGATIVE NEGATIVE Final   Influenza B by PCR NEGATIVE NEGATIVE Final    Comment: (NOTE) The Xpert Xpress SARS-CoV-2/FLU/RSV plus assay is intended as an aid in the diagnosis of influenza from Nasopharyngeal swab specimens and should not be used as a sole basis for treatment. Nasal washings and aspirates are unacceptable for Xpert Xpress SARS-CoV-2/FLU/RSV testing.  Fact Sheet for Patients: EntrepreneurPulse.com.au  Fact Sheet for Healthcare Providers: IncredibleEmployment.be  This test is not yet approved or cleared by the [place] FDA and has been authorized for detection and/or diagnosis of SARS-CoV-2 by FDA under an Emergency Use Authorization (EUA). This EUA will remain in effect (meaning this test can be used) for the duration of the COVID-19 declaration under Section 564(b)(1) of the Act, 21  U.S.C. section 360bbb-3(b)(1), unless the authorization is terminated or revoked.  Performed at [facility], [place] [name] [place], [place] [address]   Blood Culture (routine x 2)     Status: None (Preliminary result)   Collection Time: 10/05/20  3:40 PM   Specimen: BLOOD RIGHT FOREARM  Result Value Ref Range Status   Specimen Description BLOOD RIGHT FOREARM  Final   Special Requests   Final    BOTTLES DRAWN AEROBIC AND ANAEROBIC Blood Culture results may not be  optimal due to an inadequate volume of blood received in culture bottles   Culture   Final    NO GROWTH < 24 HOURS Performed at [facility], [place] [address], [place], [place] [address]    Report Status PENDING  Incomplete  Blood Culture (routine x 2)     Status: None (Preliminary result)   Collection Time: 10/05/20 10:10 PM   Specimen: BLOOD RIGHT ARM  Result Value Ref Range Status   Specimen Description BLOOD RIGHT ARM  Final   Special Requests   Final    BOTTLES DRAWN AEROBIC AND ANAEROBIC Blood Culture adequate volume   Culture   Final    NO GROWTH < 12 HOURS Performed at [facility], [place] [address], [place], [place] [address]    Report Status PENDING  Incomplete  C Difficile Quick Screen w PCR reflex     Status: None   Collection Time: 10/07/20  5:00 AM   Specimen: STOOL  Result Value Ref Range Status   C Diff antigen NEGATIVE NEGATIVE Final   C Diff toxin NEGATIVE NEGATIVE Final   C Diff interpretation No C. difficile detected.  Final    Comment: Performed at [facility], [place] [address], [place], [place] [address]      Radiology Studies: CT Head Wo Contrast  Result Date: 10/05/2020 CLINICAL DATA:  Altered mental status and weakness EXAM: CT HEAD WITHOUT CONTRAST TECHNIQUE: Contiguous axial images were obtained from the base of the skull through the vertex without intravenous contrast. COMPARISON:  08/21/2020 FINDINGS: Brain: No evidence of acute infarction, hemorrhage, hydrocephalus, extra-axial collection or mass lesion/mass effect. Chronic atrophic and ischemic changes are again noted and stable. 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 Vascular: No hyperdense vessel or unexpected calcification. Skull: Normal. Negative for fracture or focal lesion. Sinuses/Orbits: No acute finding. Other: None. IMPRESSION: Chronic atrophic and ischemic changes without acute intracranial abnormality. Electronically Signed   By: Inez Catalina M.D.   On:  10/05/2020 19:14   US Abdomen Complete  Result Date: 10/05/2020 CLINICAL DATA:  Abdominal pain, elevated liver function tests, elevated bilirubin EXAM: ABDOMEN ULTRASOUND COMPLETE COMPARISON:  04/28/2019 FINDINGS: Gallbladder: Not visualized. Common bile duct: Diameter: 3 mm Liver: Mild heterogeneous increased liver echotexture is nonspecific, but could reflect hepatic steatosis. No focal liver abnormality. No intrahepatic biliary duct dilation. Portal vein is patent on color Doppler imaging with normal direction of blood flow towards the liver. IVC: Not well visualized due to bowel gas Pancreas: Visualized portion unremarkable. Spleen: Size and appearance within normal limits. Right Kidney: Length: 7.5 cm. Echogenicity within normal limits. No mass or hydronephrosis visualized. Left Kidney: Length: 6.7 cm. Echogenicity within normal limits. No mass or hydronephrosis visualized. Abdominal aorta: Proximal aorta is unremarkable. Distal aorta and bifurcation not visualized due to bowel gas. Other findings: Incidental right pleural effusion. IMPRESSION: 1. Mild heterogeneous increased liver echotexture compatible with hepatic steatosis. 2. Nonvisualization of the gallbladder, likely  due to decompressed state. 3. Limited study due to bowel gas. 4. Right pleural effusion. Electronically Signed   By: Randa Ngo M.D.   On: 10/05/2020 18:45   CT ABDOMEN PELVIS W CONTRAST  Result Date: 10/05/2020 CLINICAL DATA:  Abdominal abscess/infection suspected. Mental status changes. Weakness. Dialysis patient without full dialysis treatment since last Thursday. EXAM: CT ABDOMEN AND PELVIS WITH CONTRAST TECHNIQUE: Multidetector CT imaging of the abdomen and pelvis was performed using the standard protocol following bolus administration of intravenous contrast. CONTRAST:  28m OMNIPAQUE IOHEXOL 300 MG/ML  SOLN COMPARISON:  04/28/2019 FINDINGS: Lower chest: There are bilateral pleural effusions. The heart is enlarged. No  pericardial effusion. Hepatobiliary: Small amount of ascites surrounding the liver. Liver is mildly heterogeneous without focal lesion. Gallbladder is decompressed. Pancreas: Unremarkable. No pancreatic ductal dilatation or surrounding inflammatory changes. Spleen: Normal in size without focal abnormality. Adrenals/Urinary Tract: Normal appearance of both adrenal glands. Small bilateral kidneys. Mild bilateral perinephric nonspecific stranding. There is no hydronephrosis or suspicious renal mass. The ureters are unremarkable. The bladder and visualized portion of the urethra are normal. Stomach/Bowel: Stomach is unremarkable. Small bowel loops are unremarkable. There is thickening of the ascending and proximal transverse colon consistent with colitis. The appendix is well seen and has a normal appearance. Vascular/Lymphatic: There is dense atherosclerotic calcification of the abdominal aorta. Although involved by atherosclerosis, there is vascular opacification of the celiac axis, superior mesenteric artery, and inferior mesenteric artery. Normal appearance of the portal venous system and inferior vena cava. The No retroperitoneal or mesenteric adenopathy. Reproductive: Uterus is present. A 1.5 centimeter low-attenuation lesion is identified within the LEFT ovary. The region of the RIGHT ovary is unremarkable. A small amount of free pelvic fluid. Other: Anterior abdominal wall is unremarkable. Musculoskeletal: No acute or significant osseous findings. IMPRESSION: 1. Thickening of the ascending and proximal transverse colon consistent with colitis, likely infectious or inflammatory. 2. Small amount of ascites. 3. Bilateral pleural effusions. 4. Cardiomegaly. 5. Small bilateral kidneys. 6. 1.5 centimeter low-attenuation lesion within the LEFT ovary, likely a cyst. No follow-up imaging recommended. Note: This recommendation does not apply to premenarchal patients and to those with increased risk (genetic, family  history, elevated tumor markers or other high-risk factors) of ovarian cancer. Reference: JACR 2020 Feb; 17(2):248-254 7. Liver is mildly heterogeneous without focal lesion. 8. Aortic Atherosclerosis (ICD10-I70.0). Electronically Signed   By: ENolon NationsM.D.   On: 10/05/2020 19:23   DG Chest Portable 1 View  Result Date: 10/05/2020 CLINICAL DATA:  Weakness and shortness of breath EXAM: PORTABLE CHEST 1 VIEW COMPARISON:  Portable exam 1323 hours compared to 08/21/2020 FINDINGS: Enlargement of cardiac silhouette. Mediastinal contours and pulmonary vascularity normal. Atherosclerotic calcification aorta. Chronic peribronchial thickening. No pulmonary infiltrate, pleural effusion, or pneumothorax. Eventration RIGHT diaphragm stable. Bones demineralized. IMPRESSION: Chronic bronchitic changes without infiltrate. Enlargement of cardiac silhouette. Aortic Atherosclerosis (ICD10-I70.0). Electronically Signed   By: MLavonia DanaM.D.   On: 10/05/2020 13:51   ECHOCARDIOGRAM COMPLETE  Result Date: 10/05/2020    ECHOCARDIOGRAM REPORT   Patient Name:   DSOUAD BARTERDate of Exam: 10/05/2020 Medical Rec #:  [id number]     Height:       54.0 in Accession #:    [id number]    Weight:       98.0 lb Date of Birth:  [birth year]     BSA:          1.277 m Patient Age:    89years  BP:           163/53 mmHg Patient Gender: F              HR:           67 bpm. Exam Location:  Inpatient Procedure: 2D Echo Indications:    Cardiomegaly  History:        Patient has prior history of Echocardiogram examinations, most                 recent 12/03/2019. End stage renal disease; Risk                 Factors:Hypertension, Dyslipidemia and Diabetes.  Sonographer:    Johny Chess Referring Phys: [id number] [place]  1. Left ventricular ejection fraction, by estimation, is 50 to 55%. The left ventricle has low normal function. The left ventricle has no regional wall motion abnormalities. Left ventricular diastolic  parameters are indeterminate. There is the interventricular septum is flattened in systole and diastole, consistent with right ventricular pressure and volume overload.  2. Right ventricular systolic function is mildly reduced. The right ventricular size is severely enlarged.  3. Left atrial size was moderately dilated.  4. Right atrial size was moderately dilated.  5. A small pericardial effusion is present.  6. The mitral valve is normal in structure. Mild to moderate mitral valve regurgitation.  7. 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  8. The aortic valve is tricuspid. Aortic valve regurgitation is not visualized. No aortic stenosis is present.  9. The inferior vena cava is dilated in size with <50% respiratory variability, suggesting right atrial pressure of 15 mmHg. Comparison(s): Changes from prior study are noted. EF slightly decreased from prior. Conclusion(s)/Recommendation(s): Severe TR, with non-coaptation of leaflets. This likely causes the RVSP to be inaccurate due to partial equalization of pressures. Ascites also seen on subcostal imaging. FINDINGS  Left Ventricle: Left ventricular ejection fraction, by estimation, is 50 to 55%. The left ventricle has low normal function. The left ventricle has no regional wall motion abnormalities. The left ventricular internal cavity size was normal in size. There is no left ventricular hypertrophy. The interventricular septum is flattened in systole and diastole, consistent with right ventricular pressure and volume overload. Left ventricular diastolic parameters are indeterminate. Right Ventricle: RVSP inaccurate due to possibility of equalization of RV/RA pressure from severe TR. The right ventricular size is severely enlarged. Right vetricular wall thickness was not well visualized. Right ventricular systolic function is mildly reduced. The tricuspid regurgitant velocity is 2.13 m/s, and with an assumed right  atrial pressure of 15 mmHg, the estimated right ventricular systolic pressure is [id number] mmHg. Left Atrium: Left atrial size was moderately dilated. Right Atrium: Right atrial size was moderately dilated. Pericardium: A small pericardial effusion is present. Mitral Valve: The mitral valve is normal in structure. Mild to moderate mitral valve regurgitation. 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 The tricuspid valve is normal in structure. 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 Aortic Valve: The aortic valve is tricuspid. Aortic valve regurgitation is not visualized. No aortic stenosis is present. Pulmonic Valve: The pulmonic valve was not well visualized. Pulmonic valve regurgitation is not visualized. Aorta: The aortic root, ascending aorta and aortic arch are all structurally normal, with no evidence of dilitation or obstruction. Venous: The inferior vena cava is dilated in size with less than 50% respiratory variability, suggesting right atrial pressure of 15 mmHg.  IAS/Shunts: The atrial septum is grossly normal. Additional Comments: Moderate ascites is present.  LEFT VENTRICLE PLAX 2D LVIDd:         4.40 cm     Diastology LVIDs:         3.30 cm     LV e' medial:    4.90 cm/s LV PW:         1.00 cm     LV E/e' medial:  14.3 LV IVS:        0.80 cm     LV e' lateral:   7.51 cm/s LVOT diam:     1.50 cm     LV E/e' lateral: 9.4 LV SV:         27 LV SV Index:   21 LVOT Area:     1.77 cm  LV Volumes (MOD) LV vol d, MOD A4C: 69.1 ml LV vol s, MOD A4C: 38.3 ml LV SV MOD A4C:     69.1 ml RIGHT VENTRICLE            IVC RV S prime:     9.36 cm/s  IVC diam: 2.20 cm TAPSE (M-mode): 1.7 cm LEFT ATRIUM             Index       RIGHT ATRIUM           Index LA diam:        3.80 cm 2.97 cm/m  RA Area:     15.10 cm LA Vol (A2C):   43.2 ml 33.82 ml/m RA Volume:   36.10 ml  28.26 ml/m LA Vol (A4C):   39.8 ml 31.16 ml/m LA Biplane Vol: 42.6 ml 33.35 ml/m  AORTIC VALVE LVOT Vmax:   74.40 cm/s LVOT Vmean:   44.800 cm/s LVOT VTI:    0.150 m  AORTA Ao Root diam: 2.50 cm Ao Asc diam:  2.40 cm MITRAL VALVE               TRICUSPID VALVE MV Area (PHT): 4.60 cm    TR Peak grad:   18.1 mmHg MV Decel Time: 165 msec    TR Vmax:        213.00 cm/s MV E velocity: 70.30 cm/s MV A velocity: 68.60 cm/s  SHUNTS MV E/A ratio:  1.02        Systemic VTI:  0.15 m                            Systemic Diam: 1.50 cm Buford Dresser MD Electronically signed by Buford Dresser MD Signature Date/Time: 10/05/2020/7:59:01 PM    Final     Scheduled Meds: . aspirin  81 mg Oral Daily  . carvedilol  6.25 mg Oral BID WC  . Chlorhexidine Gluconate Cloth  6 each Topical Q0600  . doxercalciferol  5 mcg Intravenous Q T,Th,Sa-HD  . famotidine  10 mg Oral q AM  . FLUoxetine  20 mg Oral Daily  . insulin aspart  0-6 Units Subcutaneous TID WC  . midodrine  10 mg Oral Q T,Th,Sa-HD  . multivitamin with minerals  1 tablet Oral Daily  . pantoprazole  40 mg Oral Daily  . polyethylene glycol  17 g Oral Daily  . sevelamer carbonate  800 mg Oral TID WC   Continuous Infusions: . sodium chloride    . sodium chloride    . ceFEPime (MAXIPIME) IV 1 g (10/06/20 1841)  . metronidazole 500 mg (10/07/20 0816)  LOS: 2 days   Time spent: 30-minute   Darliss Cheney, MD [facility]  10/07/2020, 10:39 AM   To contact the attending provider between 7A-7P or the covering provider during after hours 7P-7A, please log into the web site [email or web address].

## 2020-10-07 NOTE — Consult Note (Signed)
Consultation Note Date: 10/07/2020   Patient Name: [name] Michael  DOB: 1960/09/29  MRN: [id number]  Age / Sex: 60 y.o., female  PCP: [name] Rakes, MD Referring Physician: Darliss Cheney, MD  Reason for Consultation: Establishing goals of care  HPI/Patient Profile: 60 y.o. female  with past medical history of ESRD on HD, chronic diastolic CHF, type 2 diabetes, HTN, HLD, who presented to the emergency department from her assisted living facility on 10/05/2020 with report of missed HD sessions over the past week due to worsening weakness and increased oxygen requirements. Patient was recently hospitalized at [facility] 3/1--3/3 with acute metabolic encephalopathy presumed secondary to missed HD sessions as well as polypharmacy from home gabapentin, Topamax, and Prozac. No infectious or neurologic etiology for encephalopathy was found at that time.  In the ED, patient was hypoxic with initial spo2 of 83%. Labs significant for elevated liver enzymes, potassium 5.2, creatinine 7.6, ph 7.3, lactic acid 5.3, platelets 72 (baseline 295).  Patient admitted to [facility] with increased weakness, acute encephalopathy, and acute hypoxic respiratory failure.   Patient is currently enrolled in outpatient palliative care services with [name] Michael - last visit was in February 2022.   Clinical Assessment and Goals of Care: I have reviewed medical records including EPIC notes, labs and imaging, examined the patient, and discussed with the bedside RN. Patient is lethargic, reports being "sleepy". She is able to answer some questions but her ability to fully engage in conversation remains limited. She does report "staying in bed a lot" at the assisted living facility.   I spoke with sister/[name] Michael by phone to discuss diagnosis, prognosis, GOC, EOL wishes, disposition, and options. I introduced Palliative Medicine as specialized medical care for people  living with serious illness. It focuses on providing relief from the symptoms and stress of a serious illness.   We discussed a brief life review of the patient. [name] Michael describes her sister [name] Michael as someone who "lives in the moment", and was never very concerned about the future. 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 She never married or had children.   [name] Michael has resided in assisted living since 2018. She has been on dialysis since November 2020. As far as functional and nutritional status, [name] Michael reports a decline over the past few months. [name] Michael started having falls, then started spending more time in bed due to staff discouraging her from walking around because she was a fall risk.    We discussed [name] Michael's current illness and what it means in the larger context of her ongoing co-morbidities.  Natural disease trajectory of ESRD and chronic illness was discussed. [name] Michael shares that their mother also had ESRD and heart failure - they watched her decline until she passed away at the age of 80. [name] Michael shares she feels [name] Michael may  be showing a similar clinical picture. She fears [name] has "given up". We discussed the concept of failure to thrive - that it includes poor appetite, increased fatigue, and a progressive functional decline.    I attempted to elicit values and goals of care important to the patient. The short-term goal is for [name] Michael to return to assisted living. [name] Michael is hopeful [name] Michael will show some improvement and regain some strength - however, she also expresses concern that [name] Michael will continue to decline.     The difference between aggressive medical intervention and comfort care was considered in light of the patient's goals of care.  I introduced the  concept of a comfort path to [name] Michael and encouraged her to think about at what point patient/family would want to stop full scope medical interventions and focus on comfort rather than prolonging life. Introduced hospice philosophy and provided information on home vs residential hospice services.   Provided education and counseling that dialysis is a life-prolonging intervention, and that many patients eventually are not able to tolerate dialysis due to frailty or other medical conditions. Discussed that patients also have the option to stop dialysis due to poor quality of life. Discussed that when the decision is made to stop dialysis, hospice care is appropriate to ensure comfort at EOL.   Discussed that if [name] continues on her current trajectory, [name] Michael may eventually be faced with the decision of whether to continue dialysis.  [name] Michael agrees that if [name] Michael's functional status and quality of life continues to decline, it would be appropriate to consider stopping dialysis and she would be able to make this decision on her behalf if needed.    Questions and concerns were addressed.  [name] Michael was provided with PMT contact info and encouraged to call with questions or concerns.    Primary decision maker: In her current state, patient would need support from her sister/[name] Michael to make any complex medical decisions    SUMMARY OF RECOMMENDATIONS    DNR/DNI as previously documented  Continue current medical care  Short-term goal of care is to return to assisted living facility  Sister is cautiously hopeful for improvement, but understands reality that patient may continue to decline  If quality of life and functional status continues to decline, recommend continued discussions about stopping dialysis and utilizing hospice care  Continue outpatient palliative care services at discharge  Code Status/Advance Care Planning:  DNR  Symptom Management:   Per primary team  Palliative Prophylaxis:   Turn  [name] Michael, fall precautions  Additional Recommendations (Limitations, Scope, Preferences):  Full Scope Treatment  Psycho-social/Spiritual:   Created space and opportunity for patient and family to express thoughts and feelings regarding patient's current medical situation.   Emotional support provided   Prognosis:   Unable to determine  Discharge Planning: assisted living with outpatient palliative       Primary Diagnoses: Present on Admission: . Acute metabolic encephalopathy . Anemia of chronic disease . Chronic diastolic CHF (congestive heart failure) ([place]) . Depression . ESRD (end stage renal disease) ([place]) . Essential hypertension . Hyperlipidemia . Severe tricuspid regurgitation . Lactic acidosis . Transaminitis . Thrombocytopenia ([place]) . Acute respiratory failure with hypoxia ([place])   I have reviewed the medical record, interviewed the patient and family, and examined the patient. The following aspects are pertinent.  Past Medical History:  Diagnosis Date  . Ambulates with cane   . Constipation   . Depression   . ESRD (end stage renal disease) ([place])    a. TTS  Dialysis.  Marland Kitchen GERD (gastroesophageal reflux disease)   . History of stress test    a. 01/2003 MV: EF 74%, no ischemia/infarct.  . Hyperlipidemia   . Hypertension   . Osteoporosis   . Stroke [facility]) 04-01-11   left frontal subcortical, saw Dr. [name]   . Syncope 11/2019  . TIA (transient ischemic attack) 03-12-11  . Tobacco abuse   . Tricuspid regurgitation    a. 05/2016 Echo: EF 65-70%, Gr2DD, mild MR, nl RV fxn, Triv TR, PASP 74mHg; b. 05/2018 Echo: EF 60-65%, Gr2DD, mild MR/TR, RVSP/PASP 32mg; c. 11/2019 Echo: EF 55-60%, no rwma, mild-mod MR, Sev TR w/ RV dilatation (CTA chest neg for PE).  . [place]  . Vitamin D deficiency     Family History  Problem Relation Age of Onset  . Stroke Mother   . Diabetes Mother   . Kidney failure Mother   . Heart failure Mother   .  Stroke Father   . Cancer Sister        Breast- 50's   Scheduled Meds: . aspirin  81 mg Oral Daily  . carvedilol  6.25 mg Oral BID WC  . Chlorhexidine Gluconate Cloth  6 each Topical Q0600  . doxercalciferol  5 mcg Intravenous Q T,Th,Sa-HD  . famotidine  10 mg Oral q AM  . FLUoxetine  20 mg Oral Daily  . Gerhardt's butt cream   Topical QID  . insulin aspart  0-6 Units Subcutaneous TID WC  . midodrine  10 mg Oral Q T,Th,Sa-HD  . multivitamin with minerals  1 tablet Oral Daily  . pantoprazole  40 mg Oral Daily  . polyethylene glycol  17 g Oral Daily  . sevelamer carbonate  800 mg Oral TID WC   Continuous Infusions: . sodium chloride    . sodium chloride    . ceFEPime (MAXIPIME) IV 1 g (10/06/20 1841)  . metronidazole 500 mg (10/07/20 0816)   PRN Meds:.sodium chloride, sodium chloride, alteplase, cloNIDine, heparin, hydrALAZINE, lidocaine (PF), lidocaine-prilocaine, ondansetron **OR** ondansetron (ZOFRAN) IV, pentafluoroprop-tetrafluoroeth, senna-docusate Medications Prior to Admission:  Prior to Admission medications   Medication Sig Start Date End Date Taking? Authorizing Provider  Amino Acids-Protein Hydrolys (FEEDING SUPPLEMENT, PRO-STAT SUGAR FREE 64,) LIQD Take 30 mLs by mouth in the morning. WILD CHERRY FLAVOR   Yes [provider]  aspirin 81 MG chewable tablet Chew 81 mg by mouth daily.   Yes [provider]  atorvastatin (LIPITOR) 40 MG tablet Take 40 mg by mouth at bedtime.    Yes [provider]  carvedilol (COREG) 3.125 MG tablet Take 3.125 mg by mouth 2 (two) times daily with a meal.   Yes [provider]  cloNIDine (CATAPRES) 0.1 MG tablet Take 0.1 mg by mouth every 8 (eight) hours as needed (BP>160/90).   Yes [provider]  famotidine (PEPCID) 10 MG tablet Take 10 mg by mouth in the morning.   Yes [provider]  FLUoxetine (PROZAC) 20 MG tablet Take 20 mg by mouth daily.   Yes [provider]  midodrine  (PROAMATINE) 10 MG tablet Take 10 mg by mouth as directed. Take 1 tablet (10 mg) prior to Dialysis   Yes [provider]  Multiple Vitamins-Minerals (CERTAVITE SENIOR) TABS Take 1 tablet by mouth daily.   Yes [provider]  omega-3 acid ethyl esters (LOVAZA) 1 g capsule Take 2 capsules by mouth 2 (two) times daily. 05/28/20  Yes [provider]  omeprazole (  PRILOSEC) 40 MG capsule Take 40 mg by mouth daily before breakfast.    Yes [provider]  oxyCODONE (OXY IR/ROXICODONE) 5 MG immediate release tablet Take 2.5 mg by mouth every 8 (eight) hours as needed for moderate pain or severe pain (sob). 10/04/20  Yes [provider]  polyethylene glycol powder (GLYCOLAX/MIRALAX) 17 GM/SCOOP powder Take 17 g by mouth See admin instructions. Mix 17 grams of powder into 8 ounces of fluid, stir, and drink (by mouth) once a day   Yes [provider]  SACCHAROMYCES BOULARDII PO Take 1 capsule by mouth 2 (two) times daily.   Yes [provider]  senna-docusate (SENOKOT-S) 8.6-50 MG tablet Take 1 tablet by mouth at bedtime as needed for mild constipation. 06/11/18  Yes Regalado, [name], MD  sevelamer carbonate (RENVELA) 800 MG tablet Take 800 mg by mouth 3 (three) times daily with meals.   Yes [provider]  sodium bicarbonate 650 MG tablet Take 1,300 mg by mouth 2 (two) times daily.   Yes [provider]  Vitamin D, Ergocalciferol, (DRISDOL) 50000 units CAPS capsule Take 1 capsule (50,000 Units total) by mouth every 7 (seven) days. 11/21/16  Yes Ghimire, Henreitta Leber, MD  HUMALOG 100 UNIT/ML injection Inject 0.05 mLs (5 Units total) into the skin 3 (three) times daily with meals. Patient not taking: No sig reported 08/23/20   Madalyn Rob, MD   Allergies  Allergen Reactions  . Hydrocodone Nausea And Vomiting and Other (See Comments)    "Allergic," per MAR   Review of Systems  Constitutional: Positive for fatigue.  Neurological:  Positive for weakness.    Physical Exam Vitals reviewed.  Constitutional:      General: She is not in acute distress.    Appearance: She is ill-appearing.  Cardiovascular:     Rate and Rhythm: Normal rate and regular rhythm.  Pulmonary:     Effort: Pulmonary effort is normal.  Neurological:     Mental Status: She is lethargic.     Motor: Weakness present.     Vital Signs: BP (!) 181/61 (BP Location: Right Arm)   Pulse 75   Temp 97.9 F (36.6 C) (Oral)   Resp 17   Ht '4\' 6"'$  (1.372 m)   Wt 40.5 kg   LMP 03/06/2013 (LMP Unknown)   SpO2 98%   BMI 21.53 kg/m  Pain Scale: 0-10   Pain Score: 0-No pain   SpO2: SpO2: 98 % O2 Device:SpO2: 98 % O2 Flow Rate: .O2 Flow Rate (L/min): 2 L/min  IO: Intake/output summary:   Intake/Output Summary (Last 24 hours) at 10/07/2020 0950 Last data filed at 10/06/2020 1755 Gross per 24 hour  Intake --  Output 1000 ml  Net -1000 ml    LBM: Last BM Date: 10/05/20 Baseline Weight: Weight: 43.3 kg Most recent weight: Weight: 40.5 kg      Palliative Assessment/Data: PPS 30-40%     Time In: 1300 Time Out: 1412 Time Total: 72 minutes Greater than 50%  of this time was spent counseling and coordinating care related to the above assessment and plan.  Signed by: Lavena Bullion, NP   Please contact Palliative Medicine Team phone at [phone] for questions and concerns.  For individual provider: See Shea Evans

## 2020-10-07 NOTE — Progress Notes (Signed)
[facility] Progress Note    Assessment/ Plan:   ESRD: Outpatient HD orders: [facility], TTS. 3hrs 4mn, F180, 350/500, 3K/2.5Ca. Heparin 2k bolus. -HD on usual TTS schedule, next tues 4/19  Hypokalemia -kcl 423m today x 1 dose  Encephalopathy -possibly related to inadequate HD vs a possible sepsis picture? Polypharmacy a possibility as well -cth 4/15: Chronic atrophic and ischemic changes without acute intracranial abnormality.  Lactic acidosis, rectal pain. Acute colitis -Ct a/p w/ contrast 4/15 did reveal colitis -w/u and mgmt per primary service  Transaminitis -statin on hold -w/u per primary service, lft's improving  Hyperkalemia -k 5.2 yesterday, s/p HD, HD again as above  Volume/ hypertension: EDW 43kg. Attempt to achieve EDW as tolerated. Resume home anti-HTNs  Anemia of Chronic Kidney Disease: Receiving Mircera 5021m last dose 4/12. Dose recently increased to 20m33mo start on 4/19. Hold ESA for now. No need for iron.  Secondary Hyperparathyroidism/Hyperphosphatemia: monitor phos, hectorol 5mcg98mreatment, resume home binders  Vascular access: [name] AVF strong +b/t  DM2 with hyperglycemia -mgmt per primary service  Additional recommendations: - Dose all meds for creatinine clearance <10 ml/min  - Unless absolutely necessary, no MRIs with gadolinium.  - Implement save arm precautions. Prefer needle sticks in the dorsum of the hands or wrists. No blood pressure measurements in arm. - If blood transfusion is requested during hemodialysis sessions, please alert us prKorear to the session.  - If a hemodialysis catheter line culture is requested, please alert us asKoreanly hemodialysis nurses are able to collect those specimens.   Subjective:   Tolerated HD yesterday, net UF 1L. Still very drowsy, not engaging in any conversation.   Objective:   BP (!) (P) 156/55 Comment: b/p recheck  Pulse 72   Temp 98.4 F (36.9 C) (Oral)   Resp 15    Ht '4\' 6"'$  (1.372 m)   Wt 40.5 kg   LMP 03/06/2013 (LMP Unknown)   SpO2 90%   BMI 21.53 kg/m   Intake/Output Summary (Last 24 hours) at 10/07/2020 0733 [id number] data filed at 10/06/2020 1755 Gross per 24 hour  Intake --  Output 1000 ml  Net -1000 ml   Weight change: -1.2 kg  Physical Exam: Gen: Chronically ill-appearing, drowsy CVS: [id number] murmur Resp: CTA BL Abd: Nondistended Ext: No edema Neuro: Very drowsy but opens eyes to vocal and tactile stimuli Dialysis access: Left upper extremity AV fistula+b/t  Imaging: CT Head Wo Contrast  Result Date: 10/05/2020 CLINICAL DATA:  Altered mental status and weakness EXAM: CT HEAD WITHOUT CONTRAST TECHNIQUE: Contiguous axial images were obtained from the base of the skull through the vertex without intravenous contrast. COMPARISON:  08/21/2020 FINDINGS: Brain: No evidence of acute infarction, hemorrhage, hydrocephalus, extra-axial collection or mass lesion/mass effect. Chronic atrophic and ischemic changes are again noted and stable. 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 Vascular: No hyperdense vessel or unexpected calcification. Skull: Normal. Negative for fracture or focal lesion. Sinuses/Orbits: No acute finding. Other: None. IMPRESSION: Chronic atrophic and ischemic changes without acute intracranial abnormality. Electronically Signed   By: Mark Inez Catalina   On: 10/05/2020 19:14   US AbKoreamen Complete  Result Date: 10/05/2020 CLINICAL DATA:  Abdominal pain, elevated liver function tests, elevated bilirubin EXAM: ABDOMEN ULTRASOUND COMPLETE COMPARISON:  04/28/2019 FINDINGS: Gallbladder: Not visualized. Common bile duct: Diameter: 3 mm Liver: Mild heterogeneous increased liver echotexture is nonspecific, but could reflect hepatic steatosis. No focal liver abnormality. No intrahepatic biliary duct dilation. Portal vein is patent  on color Doppler imaging with normal direction of blood flow towards the liver. IVC:  Not well visualized due to bowel gas Pancreas: Visualized portion unremarkable. Spleen: Size and appearance within normal limits. Right Kidney: Length: 7.5 cm. Echogenicity within normal limits. No mass or hydronephrosis visualized. Left Kidney: Length: 6.7 cm. Echogenicity within normal limits. No mass or hydronephrosis visualized. Abdominal aorta: Proximal aorta is unremarkable. Distal aorta and bifurcation not visualized due to bowel gas. Other findings: Incidental right pleural effusion. IMPRESSION: 1. Mild heterogeneous increased liver echotexture compatible with hepatic steatosis. 2. Nonvisualization of the gallbladder, likely due to decompressed state. 3. Limited study due to bowel gas. 4. Right pleural effusion. Electronically Signed   By: Randa Ngo M.D.   On: 10/05/2020 18:45   CT ABDOMEN PELVIS W CONTRAST  Result Date: 10/05/2020 CLINICAL DATA:  Abdominal abscess/infection suspected. Mental status changes. Weakness. Dialysis patient without full dialysis treatment since last Thursday. EXAM: CT ABDOMEN AND PELVIS WITH CONTRAST TECHNIQUE: Multidetector CT imaging of the abdomen and pelvis was performed using the standard protocol following bolus administration of intravenous contrast. CONTRAST:  28m OMNIPAQUE IOHEXOL 300 MG/ML  SOLN COMPARISON:  04/28/2019 FINDINGS: Lower chest: There are bilateral pleural effusions. The heart is enlarged. No pericardial effusion. Hepatobiliary: Small amount of ascites surrounding the liver. Liver is mildly heterogeneous without focal lesion. Gallbladder is decompressed. Pancreas: Unremarkable. No pancreatic ductal dilatation or surrounding inflammatory changes. Spleen: Normal in size without focal abnormality. Adrenals/Urinary Tract: Normal appearance of both adrenal glands. Small bilateral kidneys. Mild bilateral perinephric nonspecific stranding. There is no hydronephrosis or suspicious renal mass. The ureters are unremarkable. The bladder and visualized portion  of the urethra are normal. Stomach/Bowel: Stomach is unremarkable. Small bowel loops are unremarkable. There is thickening of the ascending and proximal transverse colon consistent with colitis. The appendix is well seen and has a normal appearance. Vascular/Lymphatic: There is dense atherosclerotic calcification of the abdominal aorta. Although involved by atherosclerosis, there is vascular opacification of the celiac axis, superior mesenteric artery, and inferior mesenteric artery. Normal appearance of the portal venous system and inferior vena cava. The No retroperitoneal or mesenteric adenopathy. Reproductive: Uterus is present. A 1.5 centimeter low-attenuation lesion is identified within the LEFT ovary. The region of the RIGHT ovary is unremarkable. A small amount of free pelvic fluid. Other: Anterior abdominal wall is unremarkable. Musculoskeletal: No acute or significant osseous findings. IMPRESSION: 1. Thickening of the ascending and proximal transverse colon consistent with colitis, likely infectious or inflammatory. 2. Small amount of ascites. 3. Bilateral pleural effusions. 4. Cardiomegaly. 5. Small bilateral kidneys. 6. 1.5 centimeter low-attenuation lesion within the LEFT ovary, likely a cyst. No follow-up imaging recommended. Note: This recommendation does not apply to premenarchal patients and to those with increased risk (genetic, family history, elevated tumor markers or other high-risk factors) of ovarian cancer. Reference: JACR 2020 Feb; 17(2):248-254 7. Liver is mildly heterogeneous without focal lesion. 8. Aortic Atherosclerosis (ICD10-I70.0). Electronically Signed   By: ENolon NationsM.D.   On: 10/05/2020 19:23   DG Chest Portable 1 View  Result Date: 10/05/2020 CLINICAL DATA:  Weakness and shortness of breath EXAM: PORTABLE CHEST 1 VIEW COMPARISON:  Portable exam 1323 hours compared to 08/21/2020 FINDINGS: Enlargement of cardiac silhouette. Mediastinal contours and pulmonary vascularity  normal. Atherosclerotic calcification aorta. Chronic peribronchial thickening. No pulmonary infiltrate, pleural effusion, or pneumothorax. Eventration RIGHT diaphragm stable. Bones demineralized. IMPRESSION: Chronic bronchitic changes without infiltrate. Enlargement of cardiac silhouette. Aortic Atherosclerosis (ICD10-I70.0). Electronically Signed   By: MElta Guadeloupe  Thornton Papas M.D.   On: 10/05/2020 13:51   ECHOCARDIOGRAM COMPLETE  Result Date: 10/05/2020    ECHOCARDIOGRAM REPORT   Patient Name:   CIANDRA ARSLAN Date of Exam: 10/05/2020 Medical Rec #:  [id number]      Height:       54.0 in Accession #:    [id number]     Weight:       98.0 lb Date of Birth:  1961/03/13      BSA:          1.277 m Patient Age:    60 years       BP:           163/53 mmHg Patient Gender: F              HR:           67 bpm. Exam Location:  Inpatient Procedure: 2D Echo Indications:    Cardiomegaly  History:        Patient has prior history of Echocardiogram examinations, most                 recent 12/03/2019. End stage renal disease; Risk                 Factors:Hypertension, Dyslipidemia and Diabetes.  Sonographer:    Johny Chess Referring Phys: [id number] [place]  1. Left ventricular ejection fraction, by estimation, is 50 to 55%. The left ventricle has low normal function. The left ventricle has no regional wall motion abnormalities. Left ventricular diastolic parameters are indeterminate. There is the interventricular septum is flattened in systole and diastole, consistent with right ventricular pressure and volume overload.  2. Right ventricular systolic function is mildly reduced. The right ventricular size is severely enlarged.  3. Left atrial size was moderately dilated.  4. Right atrial size was moderately dilated.  5. A small pericardial effusion is present.  6. The mitral valve is normal in structure. Mild to moderate mitral valve regurgitation.  7. 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 Tricuspid valve  regurgitation is severe.  8. The aortic valve is tricuspid. Aortic valve regurgitation is not visualized. No aortic stenosis is present.  9. The inferior vena cava is dilated in size with <50% respiratory variability, suggesting right atrial pressure of 15 mmHg. Comparison(s): Changes from prior study are noted. EF slightly decreased from prior. Conclusion(s)/Recommendation(s): Severe TR, with non-coaptation of leaflets. This likely causes the RVSP to be inaccurate due to partial equalization of pressures. Ascites also seen on subcostal imaging. FINDINGS  Left Ventricle: Left ventricular ejection fraction, by estimation, is 50 to 55%. The left ventricle has low normal function. The left ventricle has no regional wall motion abnormalities. The left ventricular internal cavity size was normal in size. There is no left ventricular hypertrophy. The interventricular septum is flattened in systole and diastole, consistent with right ventricular pressure and volume overload. Left ventricular diastolic parameters are indeterminate. Right Ventricle: RVSP inaccurate due to possibility of equalization of RV/RA pressure from severe TR. The right ventricular size is severely enlarged. Right vetricular wall thickness was not well visualized. Right ventricular systolic function is mildly reduced. The tricuspid regurgitant velocity is 2.13 m/s, and with an assumed right atrial pressure of 15 mmHg, the estimated right ventricular systolic pressure is [id number] mmHg. Left Atrium: Left atrial size was moderately dilated. Right Atrium: Right atrial size was moderately dilated. Pericardium: A small pericardial effusion is present. Mitral Valve: The mitral valve is normal in  structure. Mild to moderate mitral valve regurgitation. 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 The tricuspid valve is normal in structure. 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 Aortic Valve: The aortic valve is tricuspid. Aortic valve  regurgitation is not visualized. No aortic stenosis is present. Pulmonic Valve: The pulmonic valve was not well visualized. Pulmonic valve regurgitation is not visualized. Aorta: The aortic root, ascending aorta and aortic arch are all structurally normal, with no evidence of dilitation or obstruction. Venous: The inferior vena cava is dilated in size with less than 50% respiratory variability, suggesting right atrial pressure of 15 mmHg. IAS/Shunts: The atrial septum is grossly normal. Additional Comments: Moderate ascites is present.  LEFT VENTRICLE PLAX 2D LVIDd:         4.40 cm     Diastology LVIDs:         3.30 cm     LV e' medial:    4.90 cm/s LV PW:         1.00 cm     LV E/e' medial:  14.3 LV IVS:        0.80 cm     LV e' lateral:   7.51 cm/s LVOT diam:     1.50 cm     LV E/e' lateral: 9.4 LV SV:         27 LV SV Index:   21 LVOT Area:     1.77 cm  LV Volumes (MOD) LV vol d, MOD A4C: 69.1 ml LV vol s, MOD A4C: 38.3 ml LV SV MOD A4C:     69.1 ml RIGHT VENTRICLE            IVC RV S prime:     9.36 cm/s  IVC diam: 2.20 cm TAPSE (M-mode): 1.7 cm LEFT ATRIUM             Index       RIGHT ATRIUM           Index LA diam:        3.80 cm 2.97 cm/m  RA Area:     15.10 cm LA Vol (A2C):   43.2 ml 33.82 ml/m RA Volume:   36.10 ml  28.26 ml/m LA Vol (A4C):   39.8 ml 31.16 ml/m LA Biplane Vol: 42.6 ml 33.35 ml/m  AORTIC VALVE LVOT Vmax:   74.40 cm/s LVOT Vmean:  44.800 cm/s LVOT VTI:    0.150 m  AORTA Ao Root diam: 2.50 cm Ao Asc diam:  2.40 cm MITRAL VALVE               TRICUSPID VALVE MV Area (PHT): 4.60 cm    TR Peak grad:   18.1 mmHg MV Decel Time: 165 msec    TR Vmax:        213.00 cm/s MV E velocity: 70.30 cm/s MV A velocity: 68.60 cm/s  SHUNTS MV E/A ratio:  1.02        Systemic VTI:  0.15 m                            Systemic Diam: 1.50 cm Buford Dresser MD Electronically signed by Buford Dresser MD Signature Date/Time: 10/05/2020/7:59:01 PM    Final     Labs: BMET Recent Labs  Lab  10/05/20 1309 10/05/20 1401 10/05/20 1412 10/06/20 0810 10/07/20 0414  NA 141 139 140 139 136  K 5.0 5.3* 5.2* 3.1* 2.7*  CL 100  --  101 99 99  CO2 22  --   --  27 29  GLUCOSE 160*  --  143* 73 120*  BUN 48*  --  61* 13 <5*  CREATININE 8.00*  --  7.60* 3.44* 1.82*  CALCIUM 9.1  --   --  8.3* 8.4*   CBC Recent Labs  Lab 10/05/20 1309 10/05/20 1401 10/05/20 1412 10/06/20 0810 10/07/20 0414  WBC 8.3  --   --  6.6 4.1  NEUTROABS 6.3  --   --  4.9 2.7  HGB 11.6* 14.6 14.6 10.4* 10.4*  HCT 39.5 43.0 43.0 34.1* 33.5*  MCV 104.2*  --   --  100.3* 96.3  PLT 72*  --   --  65* 60*    Medications:    . aspirin  81 mg Oral Daily  . carvedilol  3.125 mg Oral BID WC  . Chlorhexidine Gluconate Cloth  6 each Topical Q0600  . doxercalciferol  5 mcg Intravenous Q T,Th,Sa-HD  . famotidine  10 mg Oral q AM  . FLUoxetine  20 mg Oral Daily  . insulin aspart  0-6 Units Subcutaneous TID WC  . midodrine  10 mg Oral Q T,Th,Sa-HD  . multivitamin with minerals  1 tablet Oral Daily  . pantoprazole  40 mg Oral Daily  . polyethylene glycol  17 g Oral Daily  . potassium chloride  40 mEq Oral Once  . sevelamer carbonate  800 mg Oral TID WC      Gean Quint, MD [facility] 10/07/2020, 7:33 AM

## 2020-10-08 LAB — GLUCOSE, CAPILLARY
Glucose-Capillary: 142 mg/dL — ABNORMAL HIGH (ref 70–99)
Glucose-Capillary: 265 mg/dL — ABNORMAL HIGH (ref 70–99)
Glucose-Capillary: 183 mg/dL — ABNORMAL HIGH (ref 70–99)
Glucose-Capillary: 101 mg/dL — ABNORMAL HIGH (ref 70–99)

## 2020-10-08 LAB — CBC WITH DIFFERENTIAL/PLATELET
Abs Immature Granulocytes: 0.02 10*3/uL (ref 0.00–0.07)
Basophils Absolute: 0 10*3/uL (ref 0.0–0.1)
Basophils Relative: 1 %
Eosinophils Absolute: 0.1 10*3/uL (ref 0.0–0.5)
Eosinophils Relative: 2 %
HCT: 35.2 % — ABNORMAL LOW (ref 36.0–46.0)
Hemoglobin: 10.6 g/dL — ABNORMAL LOW (ref 12.0–15.0)
Immature Granulocytes: 0 %
Lymphocytes Relative: 15 %
Lymphs Abs: 0.7 10*3/uL (ref 0.7–4.0)
MCH: 30.2 pg (ref 26.0–34.0)
MCHC: 30.1 g/dL (ref 30.0–36.0)
MCV: 100.3 fL — ABNORMAL HIGH (ref 80.0–100.0)
Monocytes Absolute: 0.6 10*3/uL (ref 0.1–1.0)
Monocytes Relative: 13 %
Neutro Abs: 3.2 10*3/uL (ref 1.7–7.7)
Neutrophils Relative %: 69 %
Platelets: 53 10*3/uL — ABNORMAL LOW (ref 150–400)
RBC: 3.51 MIL/uL — ABNORMAL LOW (ref 3.87–5.11)
RDW: 19.3 % — ABNORMAL HIGH (ref 11.5–15.5)
WBC: 4.7 10*3/uL (ref 4.0–10.5)
nRBC: 0.4 % — ABNORMAL HIGH (ref 0.0–0.2)

## 2020-10-08 LAB — COMPREHENSIVE METABOLIC PANEL
ALT: 64 U/L — ABNORMAL HIGH (ref 0–44)
AST: 66 U/L — ABNORMAL HIGH (ref 15–41)
Albumin: 2.2 g/dL — ABNORMAL LOW (ref 3.5–5.0)
Alkaline Phosphatase: 71 U/L (ref 38–126)
Anion gap: 8 (ref 5–15)
BUN: 10 mg/dL (ref 6–20)
CO2: 23 mmol/L (ref 22–32)
Calcium: 8.6 mg/dL — ABNORMAL LOW (ref 8.9–10.3)
Chloride: 103 mmol/L (ref 98–111)
Creatinine, Ser: 3.43 mg/dL — ABNORMAL HIGH (ref 0.44–1.00)
GFR, Estimated: 15 mL/min — ABNORMAL LOW (ref >60)
Glucose, Bld: 129 mg/dL — ABNORMAL HIGH (ref 70–99)
Potassium: 3.6 mmol/L (ref 3.5–5.1)
Sodium: 134 mmol/L — ABNORMAL LOW (ref 135–145)
Total Bilirubin: 1.6 mg/dL — ABNORMAL HIGH (ref 0.3–1.2)
Total Protein: 5.1 g/dL — ABNORMAL LOW (ref 6.5–8.1)

## 2020-10-08 LAB — T3, FREE: T3, Free: 1.4 pg/mL — ABNORMAL LOW (ref 2.0–4.4)

## 2020-10-08 LAB — PATHOLOGIST SMEAR REVIEW

## 2020-10-08 MED ORDER — SODIUM CHLORIDE 0.9 % IV SOLN
1.0000 g | INTRAVENOUS | Status: DC
  Administered 2020-10-08 – 2020-10-09 (×2): 1 g via INTRAVENOUS
  Filled 2020-10-08: qty 10
  Filled 2020-10-08: qty 1

## 2020-10-08 MED ORDER — AMLODIPINE BESYLATE 5 MG PO TABS
5.0000 mg | ORAL_TABLET | Freq: Every day | ORAL | Status: DC
  Administered 2020-10-08 – 2020-10-09 (×2): 5 mg via ORAL
  Filled 2020-10-08 (×2): qty 1

## 2020-10-08 MED ORDER — GERHARDT'S BUTT CREAM
TOPICAL_CREAM | Freq: Four times a day (QID) | CUTANEOUS | Status: DC
  Administered 2020-10-08 (×2): 1 via TOPICAL
  Filled 2020-10-08: qty 1

## 2020-10-08 NOTE — Consult Note (Signed)
[name] Michael Nurse Consult Note: Patient receiving care in [facility] 2W08. Primary RN present at time of my assessment. 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 Thank you for the consult.  Discussed plan of care with the patient and bedside nurse.  [place] nurse will not follow at this time.  Please re-consult the [place] team if needed.  Val Riles, RN, MSN, CWOCN, CNS-BC, pager [phone]

## 2020-10-08 NOTE — TOC Initial Note (Signed)
Transition of Care [facility]) - Initial/Assessment Note    Patient Details  Name: [name] Michael MRN: [id number] Date of Birth: 1960-09-27  Transition of Care [facility] CM/SW Contact:    Joanne Chars, LCSW Phone Number: 10/08/2020, 3:12 PM  Clinical Narrative: CSW met with pt to discuss recommendation for SNF.  Pt is resident at [facility], agreeable to return to SNF.  Permission given to speak with sister [name].  Pt is vaccinated for covid and boosted.  CSW spoke with Rashe[facility] at [facility] and they are able to take pt back as rehab pt or back to LTC at discharge.                    Expected Discharge Plan: Skilled Nursing Facility Barriers to Discharge: No Barriers Identified   Patient Goals and CMS Choice Patient states their goals for this hospitalization and ongoing recovery are:: "be able to walk, get up and down" CMS Medicare.gov Compare Post Acute Care list provided to::  (NA-current resident at [facility])    Expected Discharge Plan and Services Expected Discharge Plan: [place] Acute Care Choice: [place] Living arrangements for the past 2 months: [place]                                      Prior Living Arrangements/Services Living arrangements for the past 2 months: [place] Lives with:: Facility Resident Patient language and need for interpreter reviewed:: Yes        Need for Family Participation in Patient Care: Yes (Comment) Care giver support system in place?: Yes (comment)   Criminal Activity/Legal Involvement Pertinent to Current Situation/Hospitalization: No - Comment as needed  Activities of Daily Living      Permission Sought/Granted                  Emotional Assessment Appearance:: Appears older than stated age Attitude/Demeanor/Rapport: Engaged Affect (typically observed): Appropriate,Pleasant Orientation: : Oriented to Self,Oriented to  Place,Oriented to  Time,Oriented to Situation Alcohol / Substance Use: Not Applicable Psych Involvement: No (comment)  Admission diagnosis:  Hypoxia [R09.02] Elevated LFTs [R79.89] ESRD on dialysis ([place]) [N18.6, Z99.2] Elevated bilirubin [R17] Abdominal pain [R10.9] Severe sepsis (HCC) [A41.9, I26.41] Acute metabolic encephalopathy [R83.09] Patient Active Problem List   Diagnosis Date Noted  . Lactic acidosis 10/05/2020  . Transaminitis 10/05/2020  . Thrombocytopenia ([place]) 10/05/2020  . Acute respiratory failure with hypoxia ([place]) 10/05/2020  . Altered mental status 08/22/2020  . Acute metabolic encephalopathy 40/76/8088  . Severe tricuspid regurgitation 12/04/2019  . ESRD (end stage renal disease) ([place])   . GERD (gastroesophageal reflux disease)   . Hypertension   . Type II diabetes mellitus ([place])   . 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 AMS (altered mental status) 04/28/2019  . Hypoxemia 04/28/2019  . Fall 06/09/2018  . Fall at home, initial encounter 06/09/2018  . Anemia of chronic disease 06/09/2018  . Syncope and collapse 06/09/2018  . Acute encephalopathy 03/10/2018  . Hypermagnesemia 03/10/2018  . Acute lower UTI 11/09/2016  . Uncontrolled type 2 diabetes mellitus with complication ([place])   . 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 Chronic diastolic CHF (congestive heart failure) ([place])   . Proliferative diabetic retinopathy ([place]) 04/29/2016  . Poor social situation  03/09/2013  . Depression 03/01/2013  . Abnormal mammogram 12/20/2012  . Retinal detachment 11/17/2012  . 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 ([place]) 35/78/9784  . Hyperlipidemia 02/09/2007  . Essential hypertension 02/09/2007   PCP:  Charlott Rakes, MD Pharmacy:   [place], [place] - [address] [place] [address] Phone:  [phone] Fax: [phone]     Social Determinants of Health (SDOH) Interventions    Readmission Risk Interventions Readmission Risk Prevention Plan 12/07/2019 04/29/2019  Transportation Screening Complete Complete  PCP or Specialist Appt within 3-5 Days Complete Complete  HRI or [place] Complete Complete  Social Work Consult for [place] Planning/Counseling Complete Complete  Palliative Care Screening Not Applicable Not Applicable  Medication Review (RN Care Manager) Referral to Pharmacy Referral to Pharmacy  Some recent data might be hidden

## 2020-10-08 NOTE — Progress Notes (Addendum)
[facility] Progress Note       [place] HD[facility]  [place] [facility]   3h 57mn 43kg  350/500  3k/2.5Ca  Hep 2000 LUA AVF    - mircera 50ug last given 4/12  - hect 5 ug, binders  Assessment/ Plan:   [facility] 3hrs 43m, F180, 350/500, 3K/2.5Ca. Heparin 2k bolus. -HD on usual TTS schedule, next tues 4/19  Encephalopathy -possibly related to inadequate HD vs a possible sepsis picture? Polypharmacy a possibility as well. Seems back to baseline.  -cth 4/15: Chronic atrophic and ischemic changes without acute intracranial abnormality.  Lactic acidosis, rectal pain. Acute colitis -Ct a/p w/ contrast 4/15 did reveal colitis -w/u and mgmt per primary service  Transaminitis -statin on hold -w/u per primary service, lft's improving  Volume/ hypertension: EDW 43kg. Resume home anti-HTNs. 2-3 kg under today.   Anemia of Chronic Kidney Disease: Receiving Mircera 5047m last dose 4/12. Dose recently increased to 49m55mo start on 4/19. Hold ESA for now. No need for iron.  Secondary Hyperparathyroidism/Hyperphosphatemia: monitor phos, hectorol 5mcg61mreatment, resume home binders  Vascular access: [name] AVF strong +b/t  DM2 with hyperglycemia -mgmt per primary service  Additional recommendations: - Dose all meds for creatinine clearance <10 ml/min  - Unless absolutely necessary, no MRIs with gadolinium.  - Implement save arm precautions. Prefer needle sticks in the dorsum of the hands or wrists. No blood pressure measurements in arm. - If blood transfusion is requested during hemodialysis sessions, please alert us prKorear to the session.  - If a hemodialysis catheter line culture is requested, please alert us asKoreanly hemodialysis nurses are able to collect those specimens.    Rob SKelly Splinter4/18/2022, 5:05 PM      Subjective:   Wide awake and alert today, no c/o's.    Objective:   BP (!) 158/55   Pulse 77   Temp 98.2 F (36.8 C)   Resp 15   Ht '4\' 6"'$   (1.372 m)   Wt 40.5 kg   LMP 03/06/2013 (LMP Unknown)   SpO2 98%   BMI 21.53 kg/m   Intake/Output Summary (Last 24 hours) at 10/08/2020 1704 Last data filed at 10/08/2020 0908 Gross per 24 hour  Intake 136.16 ml  Output --  Net 136.16 ml   Weight change:   Physical Exam: Gen: frail adult female, awake and alert, minimal engagement CVS: S1-S2Q000111Qstolic murmur Resp: CTA BL Abd: Nondistended Ext: No edema Neuro: alert, moving arms > legs Dialysis access: Left upper extremity AV fistula+b/t  Imaging: No results found.  Labs: BMET Recent Labs  Lab 10/05/20 1309 10/05/20 1401 10/05/20 1412 10/06/20 0810 10/07/20 0414 10/08/20 0407  NA 141 139 140 139 136 134*  K 5.0 5.3* 5.2* 3.1* 2.7* 3.6  CL 100  --  101 99 99 103  CO2 22  --   --  '27 29 23  '$ GLUCOSE 160*  --  143* 73 120* 129*  BUN 48*  --  61* 13 <5* 10  CREATININE 8.00*  --  7.60* 3.44* 1.82* 3.43*  CALCIUM 9.1  --   --  8.3* 8.4* 8.6*   CBC Recent Labs  Lab 10/05/20 1309 10/05/20 1401 10/05/20 1412 10/06/20 0810 10/07/20 0414 10/08/20 0407  WBC 8.3  --   --  6.6 4.1 4.7  NEUTROABS 6.3  --   --  4.9 2.7 3.2  HGB 11.6*   < > 14.6 10.4* 10.4* 10.6*  HCT 39.5   < >  43.0 34.1* 33.5* 35.2*  MCV 104.2*  --   --  100.3* 96.3 100.3*  PLT 72*  --   --  65* 60* 53*   < > = values in this interval not displayed.    Medications:    . amLODipine  5 mg Oral Daily  . aspirin  81 mg Oral Daily  . carvedilol  6.25 mg Oral BID WC  . Chlorhexidine Gluconate Cloth  6 each Topical Daily  . doxercalciferol  5 mcg Intravenous Q T,Th,Sa-HD  . famotidine  10 mg Oral q AM  . FLUoxetine  20 mg Oral Daily  . Gerhardt's butt cream   Topical QID  . insulin aspart  0-6 Units Subcutaneous TID WC  . midodrine  10 mg Oral Q T,Th,Sa-HD  . multivitamin with minerals  1 tablet Oral Daily  . pantoprazole  40 mg Oral Daily  . polyethylene glycol  17 g Oral Daily  . sevelamer carbonate  800 mg Oral TID WC

## 2020-10-08 NOTE — Evaluation (Signed)
Physical Therapy Evaluation Patient Details Name: [name] Michael MRN: [id number] DOB: 1960-12-24 Today's Date: 10/08/2020   History of Present Illness  60 year old person from [facility] facility, but seems to be fairly independent in activities of daily living. Admitted 10/05/20 to our service with acute metabolic encephalopathy. 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 PTA patient was a resident at [facility] (?ALF vs SNF level of care). She reports she was working with therapy, however ?accuracy due to her confusion. She reports she was ambulatory with RW with assist. Pt currently requires up to moderate assist for OOB and ambulating in room. Anticipate can benefit from continued therapies, acute and at SNF. Will continue to follow acutely to maximize functional mobility independence and safety.       Follow Up Recommendations SNF;Supervision/Assistance - 24 hour    Equipment Recommendations  None recommended by PT    Recommendations for Other Services       Precautions / Restrictions Precautions Precautions: Fall      Mobility  Bed Mobility Overal bed mobility: Needs Assistance Bed Mobility: Supine to Sit     Supine to sit: Mod assist;HOB elevated (with rail) Sit to supine: Min assist   General bed mobility comments: requires verbal and tactile cues/facilitation to initiate and complete tasks    Transfers Overall transfer level: Needs assistance Equipment used: Rolling walker (2 wheeled) Transfers: Sit to/from Stand Sit to Stand: Min assist;+2 safety/equipment         General transfer comment: from EOB and BSC (twice) with incr time and cues and very slow processing  Ambulation/Gait Ambulation/Gait assistance: Min  assist Gait Distance (Feet): 25 Feet (seated break; 5 ft) Assistive device: Rolling walker (2 wheeled) Gait Pattern/deviations: Step-through pattern;Decreased stride length;Shuffle     General Gait Details: assist to maneuver RW (especially in turns but also to avoid objects); steadying assist  Stairs            Wheelchair Mobility    Modified Rankin (Stroke Patients Only)       Balance Overall balance assessment: Needs assistance   Sitting balance-Leahy Scale: Fair       Standing balance-Leahy Scale: Poor                               Pertinent Vitals/Pain Pain Assessment: No/denies pain    Home Living Family/patient expects to be discharged to:: Skilled nursing facility                 Additional Comments: From [employer]    Prior Function Level of Independence: Needs assistance   Gait / Transfers Assistance Needed: walks with rollator  ADL's / Homemaking Assistance Needed: assist with bathing and dressing  Comments: pt reports she was working with therapies at SNF     Hand Dominance   Dominant Hand: Right    Extremity/Trunk Assessment   Upper Extremity Assessment Upper Extremity Assessment: Defer to OT evaluation    Lower Extremity Assessment Lower Extremity Assessment: Generalized weakness    Cervical / Trunk Assessment Cervical / Trunk Assessment: Normal  Communication   Communication: No difficulties  Cognition Arousal/Alertness: Lethargic Behavior During Therapy: Flat affect Overall Cognitive Status: No family/caregiver present to determine baseline cognitive functioning  General Comments: not oriented to place or situation (time not tested); very slow processing; poor initiation; unaware of incontinence episode in the bed      General Comments General comments (skin integrity, edema, etc.): Pt reported dizziness upon initial side to sit. No nystagmus noted and pt  unable to answer question if things in room appreared to be moving. Returned to bed due to lack of chair alarms    Exercises     Assessment/Plan    PT Assessment Patient needs continued PT services  PT Problem List Decreased strength;Decreased activity tolerance;Decreased balance;Decreased mobility;Decreased cognition;Decreased knowledge of use of DME;Decreased safety awareness;Decreased knowledge of precautions       PT Treatment Interventions DME instruction;Gait training;Functional mobility training;Therapeutic activities;Therapeutic exercise;Balance training;Cognitive remediation;Patient/family education    PT Goals (Current goals can be found in the Care Plan section)  Acute Rehab PT Goals Patient Stated Goal: eat breakfast PT Goal Formulation: With patient Time For Goal Achievement: 10/22/20 Potential to Achieve Goals: Fair    Frequency Min 2X/week   Barriers to discharge Decreased caregiver support      Co-evaluation PT/OT/SLP Co-Evaluation/Treatment: Yes Reason for Co-Treatment: Necessary to address cognition/behavior during functional activity PT goals addressed during session: Mobility/safety with mobility;Balance;Proper use of DME         AM-PAC PT "6 Clicks" Mobility  Outcome Measure Help needed turning from your back to your side while in a flat bed without using bedrails?: A Little Help needed moving from lying on your back to sitting on the side of a flat bed without using bedrails?: A Lot Help needed moving to and from a bed to a chair (including a wheelchair)?: A Little Help needed standing up from a chair using your arms (e.g., wheelchair or bedside chair)?: A Little Help needed to walk in hospital room?: A Little Help needed climbing 3-5 steps with a railing? : Total 6 Click Score: 15    End of Session Equipment Utilized During Treatment: Gait belt Activity Tolerance: Patient limited by fatigue Patient left: in bed;with call bell/phone within  reach;with bed alarm set   PT Visit Diagnosis: Unsteadiness on feet (R26.81);History of falling (Z91.81)    Time: [id number] PT Time Calculation (min) (ACUTE ONLY): 44 min   Charges:   PT Evaluation $PT Eval Moderate Complexity: 1 Mod PT Treatments $Therapeutic Activity: 8-22 mins         Arby Barrette, PT Pager [phone]   Rexanne Mano 10/08/2020, 11:25 AM

## 2020-10-08 NOTE — Telephone Encounter (Signed)
Open in error

## 2020-10-08 NOTE — Progress Notes (Signed)
Occupational Therapy Treatment Patient Details Name: [name] Michael MRN: [id number] DOB: 12-05-1960 Today's Date: 10/08/2020    History of present illness 60 year old person from [facility] facility, but seems to be fairly independent in activities of daily living. Admitted 10/05/20 to our service with acute metabolic encephalopathy. 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 Pt unable to provide prior history due to confusion.pt repotrs ambulating with walker to and from dining hall. Currently, pt limited by decreased strength, decreased ability to care for self and decreased mobility. Pt with confusion and a poor historian. pt mobilizing in room with RW with minA overall, max cues for sequencing. Pt set-upA to maxA for ADL and minA to modA for mobility with Rw. Pt requiring cues for sequencing. Pt would benefit from continued OT skilled services. OT following acutely.    Follow Up Recommendations  SNF    Equipment Recommendations  None recommended by OT    Recommendations for Other Services      Precautions / Restrictions Precautions Precautions: Fall Restrictions Weight Bearing Restrictions: No       Mobility Bed Mobility Overal bed mobility: Needs Assistance Bed Mobility: Supine to Sit     Supine to sit: Mod assist;HOB elevated (with rail) Sit to supine: Min assist   General bed mobility comments: requires verbal and tactile cues/facilitation to initiate and complete tasks    Transfers Overall transfer level: Needs assistance Equipment used: Rolling walker (2 wheeled) Transfers: Sit to/from Stand Sit to Stand: Min assist;+2 safety/equipment         General transfer comment: Increased time and slow processing; cues for sequencing     Balance Overall balance assessment: Needs assistance   Sitting balance-Leahy Scale: Fair       Standing balance-Leahy Scale: Poor                             ADL either performed or assessed with clinical judgement   ADL Overall ADL's : Needs assistance/impaired Eating/Feeding: Set up;Sitting   Grooming: Set up;Sitting   Upper Body Bathing: Set up;Sitting   Lower Body Bathing: Moderate assistance;Sit to/from stand   Upper Body Dressing : Minimal assistance   Lower Body Dressing: Moderate assistance;Sit to/from stand   Toilet Transfer: Minimal assistance;Stand-pivot;BSC   Toileting- Clothing Manipulation and Hygiene: Maximal assistance;Sit to/from stand       Functional mobility during ADLs: Minimal assistance;Rolling walker;+2 for physical assistance;+2 for safety/equipment General ADL Comments: Pt limited by decreased strength, decreased ability to care for self and decreased mobility. Pt with confusion and a poor historian. pt mobilizing in room with RW with minA overall, max cues for sequencing.     Vision Baseline Vision/History: No visual deficits Patient Visual Report: No change from baseline Vision Assessment?: No apparent visual deficits   Perception     Praxis      Cognition Arousal/Alertness: Lethargic Behavior During Therapy: Flat affect Overall Cognitive Status: No family/caregiver present to determine baseline cognitive functioning                                 General Comments: not oriented to place or situation (time not tested); very slow processing; poor  initiation; unaware of incontinence episode in the bed        Exercises     Shoulder Instructions       General Comments Dizzy with initial movement in bed    Pertinent Vitals/ Pain       Pain Assessment: No/denies pain  Home Living Family/patient expects to be discharged to:: Skilled nursing facility                                  Additional Comments: From [employer]      Prior Functioning/Environment Level of Independence: Needs assistance  Gait / Transfers Assistance Needed: walks with rollator ADL's / Homemaking Assistance Needed: assist with bathing and dressing   Comments: pt reports she was working with therapies at SNF   Frequency  Min 2X/week        Progress Toward Goals  OT Goals(current goals can now be found in the care plan section)     Acute Rehab OT Goals Patient Stated Goal: none stated OT Goal Formulation: With patient Time For Goal Achievement: 10/22/20 Potential to Achieve Goals: Good  Plan      Co-evaluation    PT/OT/SLP Co-Evaluation/Treatment: Yes Reason for Co-Treatment: Necessary to address cognition/behavior during functional activity   OT goals addressed during session: ADL's and self-care      AM-PAC OT "6 Clicks" Daily Activity     Outcome Measure   Help from another person eating meals?: A Little Help from another person taking care of personal grooming?: A Little Help from another person toileting, which includes using toliet, bedpan, or urinal?: A Lot Help from another person bathing (including washing, rinsing, drying)?: A Lot Help from another person to put on and taking off regular upper body clothing?: A Little Help from another person to put on and taking off regular lower body clothing?: A Lot 6 Click Score: 15    End of Session Equipment Utilized During Treatment: Gait belt  OT Visit Diagnosis: Unsteadiness on feet (R26.81)   Activity Tolerance Patient tolerated treatment well   Patient Left in bed;with call bell/phone within reach   Nurse Communication Mobility status        Time: [id number] OT Time Calculation (min): 44 min  Charges: OT General Charges $OT Visit: 1 Visit OT Treatments $Self Care/Home Management : 8-22 mins  Jefferey Pica, OTR/L Acute Rehabilitation Services Pager: [phone] Office: [phone]    Adysen Raphael  C 10/08/2020, 5:48 PM

## 2020-10-08 NOTE — Progress Notes (Signed)
PROGRESS NOTE    [name] Michael  [id number] DOB: [birth year] DOA: 10/05/2020 PCP: Charlott Rakes, MD   Brief Narrative:  [name] Michael is a 60 year old female with medical history significant for ESRD on HD, HTN, HLD who presented from her assisted living facility [facility] due to missed HD sessions for the past week as she has reportedly been unable to complete full sessions due to reported worsening weakness and increased oxygen requirements.   Patient was last admitted to [facility] from 3/1-3/3 for acute metabolic encephalopathy presumed secondary to missed HD sessions as well as polypharmacy from home gabapentin, Topamax and Prozac at that time.  Infectious work-up was unremarkable and no neurologic etiology for encephalopathy found CT head and MRI brain at that time.  In the ED patient was found to be afebrile with initial SPO2 of 83% requiring 15 L of oxygen support before downtrending to 3 L and maintaining sat 93%.  Initial blood pressure 206/81, now down trended to 179/87 with heart rate range 59-56. BMP showed potassium of 5.2, creatinine 7.6, glucose 143, VBG PO2 of 30, pH 7.3, lactic acid 5.3, platelets 72 (baseline 295). Chest x-ray showed chronic bronchitic changes with enlargement of cardiac silhouette. Patient was started empirically on vancomycin, Flagyl, cefepime in the ED due to concern for hidden infection in the setting of elevated lactic acidosis.  She was returned to hospital service with multiple diagnoses including weakness, acute encephalopathy which was likely uremic, acute hypoxic respiratory failure, hypertensive urgency, acute thrombocytopenia.  She was then diagnosed with acute colitis based off of the CT abdomen which showed ascending and proximal transverse colon inflammation.  She was ruled out of GI pathogen panel and C. difficile.  Antibiotics were deescalated to cefepime and Flagyl and then Rocephin and Flagyl.  Nephrology was also consulted in the beginning.   She received dialysis.  After that, she became more alert and encephalopathy resolved.  Assessment & Plan:   Active Problems:   Hyperlipidemia   Essential hypertension   Depression   Chronic diastolic CHF (congestive heart failure) (HCC)   Anemia of chronic disease   ESRD (end stage renal disease) (HCC)   Type II diabetes mellitus (HCC)   Severe tricuspid regurgitation   Acute metabolic encephalopathy   Lactic acidosis   Transaminitis   Thrombocytopenia (HCC)   Acute respiratory failure with hypoxia (HCC)  Increased weakness, unclear etiology.  Patient with no overt neurologic deficits on exam but slow to speak.  Per her nephrologist who is more familiar with her, her current affect seems inconsistent with baseline. CT head stat to ensure no acute neurologic changes.  Ammonia normal.  TSH elevated but free T4 normal.  Her symptoms could very well be due to uremia and patient has improved significantly since she has received dialysis.  She will be assessed by PT OT.  Acute colitis: CT abdomen and pelvis shows colitis of ascending and proximal transverse colon.  C. difficile as well as GI pathogen panel negative.  De-escalate to Rocephin and Flagyl.  Acute encephalopathy, suspect metabolic/uremic.  Patient has improved significantly.  She is talkative and alert and oriented.  Acute hypoxic respiratory failure, unclear etiology.  Presented with oxygen saturation of 83% requiring 15 L.  Significant improvement.  She is once again requiring 2 L of oxygen.  Lactic acidosis: Likely secondary to multiple reasons, missed hemodialysis, poor hydration and now colitis.  Resolved.  Transaminitis with mild hyperbilirubinemia.  Previous baseline within normal limits, currently AST 135 and ALT 84.  CT abdomen and pelvis did not show any liver pathology.  LFTs improving.  Thrombocytopenia, acute.  Last platelet count known in March 2020 were normal.  They were down to 93 just 2 weeks ago and now  down to 53.  No signs of bleeding.  Wonder if this is due to uremia as well.  Monitor daily.  Hypertensive urgency: Peak blood pressure of 202/80 on admission to the ED. better but is still elevated.  Continue increased dose of carvedilol at 6.125 p.o. twice daily and continue as needed clonidine 0.1 every 8 hrs as well as hydralazine IV as needed.  Will add amlodipine 5 mg p.o. daily.  Anemia of chronic disease: Hemoglobin is stable.  Monitor daily.  Hyperlipidemia: Holding statin due to elevated LFTs.  Depression, stable - Continue home fluoxetine  ESRD on HD.  Has missed several doses of dialysis as outpatient due to reported weakness and reported hypoxia.  Left-sided AV fistula Nephrology on board for dialysis.  TTS schedule.   Hypokalemia: Resolved.  Type 2 diabetes Last A1c in March 6.8.  Currently not on any insulin as outpatient: Blood sugar labile but better.  Continue SSI.  Goals of care discussion. Seen by palliative care as outpatient on 07/2020. At that time she wanted to continue with dialysis and did not want hospice at the time. At that time she wanted to be DNR.  Palliative on board again.  DVT prophylaxis: SCDs Start: 10/05/20 1701   Code Status: DNR  Family Communication: None present at bedside.  Plan of care discussed with patient   Status is: Inpatient  Remains inpatient appropriate because:Inpatient level of care appropriate due to severity of illness   Dispo: The patient is from: Home              Anticipated d/c is to: Home              Patient currently is not medically stable to d/c.   Difficult to place patient No        Estimated body mass index is 21.53 kg/m as calculated from the following:   Height as of this encounter: '4\' 6"'$  (1.372 m).   Weight as of this encounter: 40.5 kg.      Nutritional status:               Consultants:   Nephrology  Procedures:   None  Antimicrobials:  Anti-infectives (From admission,  onward)   Start     Dose/Rate Route Frequency Ordered Stop   10/08/20 1500  cefTRIAXone (ROCEPHIN) 1 g in sodium chloride 0.9 % 100 mL IVPB        1 g 200 mL/hr over 30 Minutes Intravenous Every 24 hours 10/08/20 0840     10/06/20 1800  vancomycin (VANCOREADY) IVPB 500 mg/100 mL  Status:  Discontinued        500 mg 100 mL/hr over 60 Minutes Intravenous  Once 10/06/20 1414 10/06/20 1427   10/06/20 0915  metroNIDAZOLE (FLAGYL) IVPB 500 mg  Status:  Discontinued        500 mg 100 mL/hr over 60 Minutes Intravenous Every 8 hours 10/06/20 0826 10/06/20 0834   10/06/20 0030  metroNIDAZOLE (FLAGYL) IVPB 500 mg        500 mg 100 mL/hr over 60 Minutes Intravenous Every 8 hours 10/05/20 1823     10/05/20 2159  vancomycin variable dose per unstable renal function (pharmacist dosing)  Status:  Discontinued  Does not apply See admin instructions 10/05/20 2159 10/06/20 1427   10/05/20 1530  ceFEPIme (MAXIPIME) 1 g in sodium chloride 0.9 % 100 mL IVPB  Status:  Discontinued        1 g 200 mL/hr over 30 Minutes Intravenous Every 24 hours 10/05/20 1456 10/08/20 0839   10/05/20 1530  vancomycin (VANCOREADY) IVPB 1000 mg/200 mL        1,000 mg 200 mL/hr over 60 Minutes Intravenous  Once 10/05/20 1458 10/06/20 0100   10/05/20 1500  ceFEPIme (MAXIPIME) 2 g in sodium chloride 0.9 % 100 mL IVPB  Status:  Discontinued        2 g 200 mL/hr over 30 Minutes Intravenous  Once 10/05/20 1449 10/05/20 1455   10/05/20 1500  metroNIDAZOLE (FLAGYL) IVPB 500 mg        500 mg 100 mL/hr over 60 Minutes Intravenous  Once 10/05/20 1449 10/05/20 1728   10/05/20 1500  vancomycin (VANCOREADY) IVPB 1000 mg/200 mL  Status:  Discontinued        1,000 mg 200 mL/hr over 60 Minutes Intravenous  Once 10/05/20 1449 10/05/20 1456         Subjective: Patient seen and examined.  Patient alert and oriented.  No complaints.  Objective: Vitals:   10/07/20 1935 10/07/20 2324 10/08/20 0303 10/08/20 0755  BP: (!) 125/58 (!)  134/58 (!) 151/52 (!) 164/82  Pulse: 70 72 80 81  Resp: '18 11 18 '$ (!) 23  Temp: 97.8 F (36.6 C) 98.3 F (36.8 C) 97.9 F (36.6 C) 97.8 F (36.6 C)  TempSrc: Axillary Oral Oral Oral  SpO2:  100%  100%  Weight:      Height:        Intake/Output Summary (Last 24 hours) at 10/08/2020 1108 Last data filed at 10/08/2020 0411 Gross per 24 hour  Intake 773.16 ml  Output --  Net 773.16 ml   Filed Weights   10/05/20 2000 10/06/20 1402 10/06/20 1755  Weight: 43.3 kg 42.1 kg 40.5 kg    Examination:  General exam: Appears calm and comfortable  Respiratory system: Diminished breath sounds bilaterally. Respiratory effort normal. Cardiovascular system: S1 & S2 heard, RRR. No JVD, murmurs, rubs, gallops or clicks. No pedal edema. Gastrointestinal system: Abdomen is nondistended, soft and minimal periumbilical tenderness.  No organomegaly or masses felt. Normal bowel sounds heard. Central nervous system: Alert and oriented. No focal neurological deficits. Extremities: Symmetric 5 x 5 power. Skin: No rashes, lesions or ulcers.  Psychiatry: Judgement and insight appear poor.   Data Reviewed: I have personally reviewed following labs and imaging studies  CBC: Recent Labs  Lab 10/05/20 1309 10/05/20 1401 10/05/20 1412 10/06/20 0810 10/07/20 0414 10/08/20 0407  WBC 8.3  --   --  6.6 4.1 4.7  NEUTROABS 6.3  --   --  4.9 2.7 3.2  HGB 11.6* 14.6 14.6 10.4* 10.4* 10.6*  HCT 39.5 43.0 43.0 34.1* 33.5* 35.2*  MCV 104.2*  --   --  100.3* 96.3 100.3*  PLT 72*  --   --  65* 60* 53*   Basic Metabolic Panel: Recent Labs  Lab 10/05/20 1309 10/05/20 1401 10/05/20 1412 10/05/20 2210 10/06/20 0810 10/07/20 0414 10/08/20 0407  NA 141 139 140  --  139 136 134*  K 5.0 5.3* 5.2*  --  3.1* 2.7* 3.6  CL 100  --  101  --  99 99 103  CO2 22  --   --   --  27 29  23  GLUCOSE 160*  --  143*  --  73 120* 129*  BUN 48*  --  61*  --  13 <5* 10  CREATININE 8.00*  --  7.60*  --  3.44* 1.82* 3.43*   CALCIUM 9.1  --   --   --  8.3* 8.4* 8.6*  MG  --   --   --  2.6*  --  1.8  --    GFR: Estimated Creatinine Clearance: 9.7 mL/min (A) (by C-G formula based on SCr of 3.43 mg/dL (H)). Liver Function Tests: Recent Labs  Lab 10/05/20 1309 10/06/20 0810 10/07/20 0414 10/08/20 0407  AST 135* 120* 93* 66*  ALT 84* 81* 72* 64*  ALKPHOS 90 79 77 71  BILITOT 2.2* 2.1* 1.9* 1.6*  PROT 6.3* 5.3* 5.0* 5.1*  ALBUMIN 2.9* 2.4* 2.2* 2.2*   Recent Labs  Lab 10/05/20 2210  LIPASE 44   No results for input(s): AMMONIA in the last 168 hours. Coagulation Profile: Recent Labs  Lab 10/05/20 2210  INR 2.1*   Cardiac Enzymes: No results for input(s): CKTOTAL, CKMB, CKMBINDEX, TROPONINI in the last 168 hours. BNP (last 3 results) No results for input(s): PROBNP in the last 8760 hours. HbA1C: No results for input(s): HGBA1C in the last 72 hours. CBG: Recent Labs  Lab 10/06/20 2031 10/07/20 0815 10/07/20 1200 10/07/20 1548 10/08/20 0753  GLUCAP 133* 128* 129* 215* 142*   Lipid Profile: No results for input(s): CHOL, HDL, LDLCALC, TRIG, CHOLHDL, LDLDIRECT in the last 72 hours. Thyroid Function Tests: Recent Labs    10/05/20 2210 10/06/20 0936  TSH 15.703*  --   FREET4  --  0.93  T3FREE  --  1.4*   Anemia Panel: No results for input(s): VITAMINB12, FOLATE, FERRITIN, TIBC, IRON, RETICCTPCT in the last 72 hours. Sepsis Labs: Recent Labs  Lab 10/06/20 1151 10/06/20 1422 10/06/20 2116 10/07/20 0414  LATICACIDVEN 2.5* 1.0 2.2* 1.8    Recent Results (from the past 240 hour(s))  Resp Panel by RT-PCR (Flu A&B, Covid) Nasopharyngeal Swab     Status: None   Collection Time: 10/05/20  1:11 PM   Specimen: Nasopharyngeal Swab; Nasopharyngeal(NP) swabs in vial transport medium  Result Value Ref Range Status   SARS Coronavirus 2 by RT PCR NEGATIVE NEGATIVE Final    Comment: (NOTE) SARS-CoV-2 target nucleic acids are NOT DETECTED.  The SARS-CoV-2 RNA is generally detectable in  upper respiratory specimens during the acute phase of infection. The lowest concentration of SARS-CoV-2 viral copies this assay can detect is 138 copies/mL. A negative result does not preclude SARS-Cov-2 infection and should not be used as the sole basis for treatment or other patient management decisions. A negative result may occur with  improper specimen collection/handling, submission of specimen other than nasopharyngeal swab, presence of viral mutation(s) within the areas targeted by this assay, and inadequate number of viral copies(<138 copies/mL). A negative result must be combined with clinical observations, patient history, and epidemiological information. The expected result is Negative.  Fact Sheet for Patients:  EntrepreneurPulse.com.au  Fact Sheet for Healthcare Providers:  IncredibleEmployment.be  This test is no t yet approved or cleared by the [place] FDA and  has been authorized for detection and/or diagnosis of SARS-CoV-2 by FDA under an Emergency Use Authorization (EUA). This EUA will remain  in effect (meaning this test can be used) for the duration of the COVID-19 declaration under Section 564(b)(1) of the Act, 21 U.S.C.section 360bbb-3(b)(1), unless the authorization is terminated  or  revoked sooner.       Influenza A by PCR NEGATIVE NEGATIVE Final   Influenza B by PCR NEGATIVE NEGATIVE Final    Comment: (NOTE) The Xpert Xpress SARS-CoV-2/FLU/RSV plus assay is intended as an aid in the diagnosis of influenza from Nasopharyngeal swab specimens and should not be used as a sole basis for treatment. Nasal washings and aspirates are unacceptable for Xpert Xpress SARS-CoV-2/FLU/RSV testing.  Fact Sheet for Patients: EntrepreneurPulse.com.au  Fact Sheet for Healthcare Providers: IncredibleEmployment.be  This test is not yet approved or cleared by the [place] FDA and has been  authorized for detection and/or diagnosis of SARS-CoV-2 by FDA under an Emergency Use Authorization (EUA). This EUA will remain in effect (meaning this test can be used) for the duration of the COVID-19 declaration under Section 564(b)(1) of the Act, 21 U.S.C. section 360bbb-3(b)(1), unless the authorization is terminated or revoked.  Performed at [facility], [place] [address] [place], [place] [address]   Blood Culture (routine x 2)     Status: None (Preliminary result)   Collection Time: 10/05/20  3:40 PM   Specimen: BLOOD RIGHT FOREARM  Result Value Ref Range Status   Specimen Description BLOOD RIGHT FOREARM  Final   Special Requests   Final    BOTTLES DRAWN AEROBIC AND ANAEROBIC Blood Culture results may not be optimal due to an inadequate volume of blood received in culture bottles   Culture   Final    NO GROWTH 3 DAYS Performed at [facility], [place] [address] [place], [place] [address]    Report Status PENDING  Incomplete  Blood Culture (routine x 2)     Status: None (Preliminary result)   Collection Time: 10/05/20 10:10 PM   Specimen: BLOOD RIGHT ARM  Result Value Ref Range Status   Specimen Description BLOOD RIGHT ARM  Final   Special Requests   Final    BOTTLES DRAWN AEROBIC AND ANAEROBIC Blood Culture adequate volume   Culture   Final    NO GROWTH 2 DAYS Performed at [facility], [place] [address] [place], [place] [address]    Report Status PENDING  Incomplete  C Difficile Quick Screen w PCR reflex     Status: None   Collection Time: 10/07/20  5:00 AM   Specimen: STOOL  Result Value Ref Range Status   C Diff antigen NEGATIVE NEGATIVE Final   C Diff toxin NEGATIVE NEGATIVE Final   C Diff interpretation No C. difficile detected.  Final    Comment: Performed at [facility], [place] [address], [place], [place] [address]  Gastrointestinal Panel by PCR , Stool     Status: None   Collection Time: 10/07/20  5:00 AM   Specimen: Stool  Result Value  Ref Range Status   Campylobacter species NOT DETECTED NOT DETECTED Final   Plesimonas shigelloides NOT DETECTED NOT DETECTED Final   Salmonella species NOT DETECTED NOT DETECTED Final   Yersinia enterocolitica NOT DETECTED NOT DETECTED Final   Vibrio species NOT DETECTED NOT DETECTED Final   Vibrio cholerae NOT DETECTED NOT DETECTED Final   Enteroaggregative E coli (EAEC) NOT DETECTED NOT DETECTED Final   Enteropathogenic E coli (EPEC) NOT DETECTED NOT DETECTED Final   Enterotoxigenic E coli (ETEC) NOT DETECTED NOT DETECTED Final   Shiga like toxin producing E coli (STEC) NOT DETECTED NOT DETECTED Final   Shigella/Enteroinvasive E coli (EIEC) NOT DETECTED NOT DETECTED Final   Cryptosporidium NOT DETECTED NOT DETECTED  Final   Cyclospora cayetanensis NOT DETECTED NOT DETECTED Final   Entamoeba histolytica NOT DETECTED NOT DETECTED Final   Giardia lamblia NOT DETECTED NOT DETECTED Final   Adenovirus F40/41 NOT DETECTED NOT DETECTED Final   Astrovirus NOT DETECTED NOT DETECTED Final   Norovirus GI/GII NOT DETECTED NOT DETECTED Final   Rotavirus A NOT DETECTED NOT DETECTED Final   Sapovirus (I, II, IV, and V) NOT DETECTED NOT DETECTED Final    Comment: Performed at [facility], [address], [place], [place] [address]      Radiology Studies: No results found.  Scheduled Meds: . aspirin  81 mg Oral Daily  . carvedilol  6.25 mg Oral BID WC  . Chlorhexidine Gluconate Cloth  6 each Topical Daily  . doxercalciferol  5 mcg Intravenous Q T,Th,Sa-HD  . famotidine  10 mg Oral q AM  . FLUoxetine  20 mg Oral Daily  . insulin aspart  0-6 Units Subcutaneous TID WC  . midodrine  10 mg Oral Q T,Th,Sa-HD  . multivitamin with minerals  1 tablet Oral Daily  . pantoprazole  40 mg Oral Daily  . polyethylene glycol  17 g Oral Daily  . sevelamer carbonate  800 mg Oral TID WC   Continuous Infusions: . sodium chloride    . sodium chloride    . cefTRIAXone (ROCEPHIN)  IV    .  metronidazole 500 mg (10/08/20 0908)     LOS: 3 days   Time spent: 29 -minute   Darliss Cheney, MD [facility]  10/08/2020, 11:07 AM   To contact the attending provider between 7A-7P or the covering provider during after hours 7P-7A, please log into the web site [email or web address].

## 2020-10-09 LAB — COMPREHENSIVE METABOLIC PANEL
ALT: 57 U/L — ABNORMAL HIGH (ref 0–44)
AST: 44 U/L — ABNORMAL HIGH (ref 15–41)
Albumin: 2.1 g/dL — ABNORMAL LOW (ref 3.5–5.0)
Alkaline Phosphatase: 71 U/L (ref 38–126)
Anion gap: 9 (ref 5–15)
BUN: 15 mg/dL (ref 6–20)
CO2: 24 mmol/L (ref 22–32)
Calcium: 9.1 mg/dL (ref 8.9–10.3)
Chloride: 101 mmol/L (ref 98–111)
Creatinine, Ser: 4.33 mg/dL — ABNORMAL HIGH (ref 0.44–1.00)
GFR, Estimated: 11 mL/min — ABNORMAL LOW (ref >60)
Glucose, Bld: 123 mg/dL — ABNORMAL HIGH (ref 70–99)
Potassium: 3.3 mmol/L — ABNORMAL LOW (ref 3.5–5.1)
Sodium: 134 mmol/L — ABNORMAL LOW (ref 135–145)
Total Bilirubin: 1.4 mg/dL — ABNORMAL HIGH (ref 0.3–1.2)
Total Protein: 4.8 g/dL — ABNORMAL LOW (ref 6.5–8.1)

## 2020-10-09 LAB — CBC
HCT: 33.8 % — ABNORMAL LOW (ref 36.0–46.0)
Hemoglobin: 10.2 g/dL — ABNORMAL LOW (ref 12.0–15.0)
MCH: 29.7 pg (ref 26.0–34.0)
MCHC: 30.2 g/dL (ref 30.0–36.0)
MCV: 98.3 fL (ref 80.0–100.0)
Platelets: 53 10*3/uL — ABNORMAL LOW (ref 150–400)
RBC: 3.44 MIL/uL — ABNORMAL LOW (ref 3.87–5.11)
RDW: 18.9 % — ABNORMAL HIGH (ref 11.5–15.5)
WBC: 4.5 10*3/uL (ref 4.0–10.5)
nRBC: 0.7 % — ABNORMAL HIGH (ref 0.0–0.2)

## 2020-10-09 LAB — SARS CORONAVIRUS 2 (TAT 6-24 HRS): SARS Coronavirus 2: NEGATIVE

## 2020-10-09 LAB — GLUCOSE, CAPILLARY: Glucose-Capillary: 94 mg/dL (ref 70–99)

## 2020-10-09 MED ORDER — CIPROFLOXACIN HCL 250 MG PO TABS: 250.0000 mg | ORAL_TABLET | Freq: Every day | ORAL | 0 refills | Status: DC

## 2020-10-09 MED ORDER — METRONIDAZOLE 500 MG PO TABS: 500.0000 mg | ORAL_TABLET | Freq: Three times a day (TID) | ORAL | 0 refills | Status: DC

## 2020-10-09 MED ORDER — DOXERCALCIFEROL 4 MCG/2ML IV SOLN
INTRAVENOUS | Status: AC
  Administered 2020-10-09: 5 ug via INTRAVENOUS
  Filled 2020-10-09: qty 4

## 2020-10-09 MED ORDER — CARVEDILOL 6.25 MG PO TABS: 6.2500 mg | ORAL_TABLET | Freq: Two times a day (BID) | ORAL | 0 refills | Status: DC

## 2020-10-09 MED ORDER — HEPARIN SODIUM (PORCINE) 1000 UNIT/ML DIALYSIS: 2000.0000 [IU] | Freq: Once | INTRAMUSCULAR | Status: AC

## 2020-10-09 MED ORDER — HEPARIN SODIUM (PORCINE) 1000 UNIT/ML IJ SOLN
INTRAMUSCULAR | Status: AC
  Administered 2020-10-09: 2000 [IU] via INTRAVENOUS_CENTRAL
  Filled 2020-10-09: qty 2

## 2020-10-09 MED ORDER — AMLODIPINE BESYLATE 5 MG PO TABS: 5.0000 mg | ORAL_TABLET | Freq: Every day | ORAL | 0 refills | Status: DC

## 2020-10-09 NOTE — Progress Notes (Signed)
Patient given prn hydralazine for hypertension. Called Dialysis RN to give report on medication given while she was on the floor.

## 2020-10-09 NOTE — Progress Notes (Signed)
Heart Failure Navigator Progress Note  Assessed for Heart & Vascular TOC clinic readiness.  Unfortunately at this time the patient does not meet criteria due to ESRD on HD.   Navigator available for reassessment of patient.   Pricilla Holm, RN, BSN Heart Failure Nurse Navigator [phone]

## 2020-10-09 NOTE — Plan of Care (Signed)
Clinical Measurements: Will remain free of infection.  Progress: Pt continues abt with no s/s of adverse reaction to medication.

## 2020-10-09 NOTE — Plan of Care (Deleted)
Clinical measurement: Respiratory complications will improve.  Progress: Oxygen sats remain greater than 90%, Pt is tolerating room air w/o difficulty.

## 2020-10-09 NOTE — TOC Transition Note (Signed)
Transition of Care [facility]) - CM/SW Discharge Note   Patient Details  Name: [name] Michael MRN: [id number] Date of Birth: 08/10/1960  Transition of Care [facility]) CM/SW Contact:  Joanne Chars, LCSW Phone Number: 10/09/2020, 1:36 PM   Clinical Narrative:  Pt discharging to [facility].  RN call report to [phone].       Final next level of care: Skilled Nursing Facility Barriers to Discharge: No Barriers Identified   Patient Goals and CMS Choice Patient states their goals for this hospitalization and ongoing recovery are:: "be able to walk, get up and down" CMS Medicare.gov Compare Post Acute Care list provided to::  (NA-current resident at [facility])    Discharge Placement              Patient chooses bed at:  [facility]) Patient to be transferred to facility by: [facility] Name of family member notified: [name], sister Patient and family notified of of transfer: 10/09/20  Discharge Plan and Services     Post Acute Care Choice: [facility]                               Social Determinants of Health (SDOH) Interventions     Readmission Risk Interventions Readmission Risk Prevention Plan 12/07/2019 04/29/2019  Transportation Screening Complete Complete  PCP or Specialist Appt within 3-5 Days Complete Complete  HRI or Home Care Consult Complete Complete  Social Work Consult for [place] Planning/Counseling Complete Complete  Palliative Care Screening Not Applicable Not Applicable  Medication Review (RN Care Manager) Referral to Pharmacy Referral to Pharmacy  Some recent data might be hidden

## 2020-10-09 NOTE — Progress Notes (Signed)
[facility] Progress Note       [facility]   3h 28mn 43kg  350/500  3k/2.5Ca  Hep 2000 LUA AVF    - mircera 50ug last given 4/12  - hect 5 ug, binders  Assessment/ Plan:   [facility] 3h[facility] 424m, F180, 350/500, 3K/2.5Ca. Heparin 2k bolus. -HD on usual TTS schedule, next HD this am  Encephalopathy -possibly related to inadequate HD vs a possible sepsis picture? Polypharmacy a possibility as well. Seems back to baseline.  -cth 4/15: Chronic atrophic and ischemic changes without acute intracranial abnormality.  Lactic acidosis, rectal pain. Acute colitis -Ct a/p w/ contrast 4/15 did reveal colitis -w/u and mgmt per primary service  Transaminitis -statin on hold -w/u per primary service, lft's improving  Volume/ hypertension: cont bp meds. Under dry wt 2-3kg.   Anemia of Chronic Kidney Disease: Receiving Mircera 5061m last dose 4/12. Dose recently increased to 49m52mo start on 4/19. Hold ESA for now. No need for iron.  Secondary Hyperparathyroidism/Hyperphosphatemia: monitor phos, hectorol 5mcg55mreatment, resume home binders  Vascular access: [name] AVF strong +b/t  DM2 with hyperglycemia -mgmt per primary service  Dispo - for dc to SNF today    Rob SKelly Splinter4/19/2022, 12:00 PM      Subjective:   Alert and awake. On HD. Going to SNF today.    Objective:   BP (!) 126/55   Pulse 76   Temp 98.1 F (36.7 C) (Oral)   Resp 12   Ht '4\' 6"'$  (1.372 m)   Wt 40.5 kg   LMP 03/06/2013 (LMP Unknown)   SpO2 98%   BMI 21.53 kg/m   Intake/Output Summary (Last 24 hours) at 10/09/2020 1200 Last data filed at 10/09/2020 0800 Gross per 24 hour  Intake 302.96 ml  Output --  Net 302.96 ml   Weight change:   Physical Exam: Gen: frail adult female, awake and alert, minimal engagement CVS: [id number] murmur Resp: CTA BL Abd: Nondistended Ext: No edema Neuro: alert, moving arms > legs Dialysis access: Left upper extremity  AV fistula+b/t  Imaging: No results found.  Labs: BMET Recent Labs  Lab 10/05/20 1309 10/05/20 1401 10/05/20 1412 10/06/20 0810 10/07/20 0414 10/08/20 0407 10/09/20 0242  NA 141 139 140 139 136 134* 134*  K 5.0 5.3* 5.2* 3.1* 2.7* 3.6 3.3*  CL 100  --  101 99 99 103 101  CO2 22  --   --  '27 29 23 24  '$ GLUCOSE 160*  --  143* 73 120* 129* 123*  BUN 48*  --  61* 13 <5* 10 15  CREATININE 8.00*  --  7.60* 3.44* 1.82* 3.43* 4.33*  CALCIUM 9.1  --   --  8.3* 8.4* 8.6* 9.1   CBC Recent Labs  Lab 10/05/20 1309 10/05/20 1401 10/06/20 0810 10/07/20 0414 10/08/20 0407 10/09/20 0242  WBC 8.3  --  6.6 4.1 4.7 4.5  NEUTROABS 6.3  --  4.9 2.7 3.2  --   HGB 11.6*   < > 10.4* 10.4* 10.6* 10.2*  HCT 39.5   < > 34.1* 33.5* 35.2* 33.8*  MCV 104.2*  --  100.3* 96.3 100.3* 98.3  PLT 72*  --  65* 60* 53* 53*   < > = values in this interval not displayed.    Medications:    . amLODipine  5 mg Oral Daily  . aspirin  81 mg Oral Daily  . carvedilol  6.25 mg Oral BID  WC  . Chlorhexidine Gluconate Cloth  6 each Topical Daily  . doxercalciferol  5 mcg Intravenous Q T,Th,Sa-HD  . famotidine  10 mg Oral q AM  . FLUoxetine  20 mg Oral Daily  . Gerhardt's butt cream   Topical QID  . insulin aspart  0-6 Units Subcutaneous TID WC  . midodrine  10 mg Oral Q T,Th,Sa-HD  . multivitamin with minerals  1 tablet Oral Daily  . pantoprazole  40 mg Oral Daily  . polyethylene glycol  17 g Oral Daily  . sevelamer carbonate  800 mg Oral TID WC

## 2020-10-09 NOTE — [place] FL2 (Signed)
[place] MEDICAID FL2 LEVEL OF CARE SCREENING TOOL     IDENTIFICATION  Patient Name: [name] Michael Birthdate: 19-Apr-1961 Sex: female Admission Date (Current Location): 10/05/2020  [place] and [place] Number:  [name] [id number] [place] and Address:  [facility], [place] [address] [place], [place] [address]      Provider Number: [id number]  Attending Physician Name and Address:  Darliss Cheney, MD  Relative Name and Phone Number:  Wynelle Link [phone]    Current Level of Care: Hospital Recommended Level of Care: [place] Prior Approval Number:    Date Approved/Denied:   PASRR Number: [id number]  Discharge Plan: SNF    Current Diagnoses: Patient Active Problem List   Diagnosis Date Noted  . Lactic acidosis 10/05/2020  . Transaminitis 10/05/2020  . Thrombocytopenia ([place]) 10/05/2020  . Acute respiratory failure with hypoxia ([place]) 10/05/2020  . Altered mental status 08/22/2020  . Acute metabolic encephalopathy [id number]  . Severe tricuspid regurgitation 12/04/2019  . ESRD (end stage renal disease) ([place])   . GERD (gastroesophageal reflux disease)   . Hypertension   . Type II diabetes mellitus ([place])   . 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 AMS (altered mental status) 04/28/2019  . Hypoxemia 04/28/2019  . Fall 06/09/2018  . Fall at home, initial encounter 06/09/2018  . Anemia of chronic disease 06/09/2018  . Syncope and collapse 06/09/2018  . Acute encephalopathy 03/10/2018  . Hypermagnesemia 03/10/2018  . Acute lower UTI 11/09/2016  . Uncontrolled type 2 diabetes mellitus with complication ([place])   . 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 Chronic diastolic CHF (congestive heart failure) ([place])   . Proliferative diabetic retinopathy ([place]) 04/29/2016  . Poor social situation 03/09/2013  . Depression 03/01/2013  . Abnormal  mammogram 12/20/2012  . Retinal detachment 11/17/2012  . 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 ([place]) [id number]  . Hyperlipidemia 02/09/2007  . Essential hypertension 02/09/2007    Orientation RESPIRATION BLADDER Height & Weight     Self,Time,Situation,Place  Normal Incontinent Weight:  (scale broken) Height:  '4\' 6"'$  (137.2 cm)  BEHAVIORAL SYMPTOMS/MOOD NEUROLOGICAL BOWEL NUTRITION STATUS      Incontinent Diet (Renal diet.  See discharge summary.)  AMBULATORY STATUS COMMUNICATION OF NEEDS Skin   Limited Assist Verbally Normal                       Personal Care Assistance Level of Assistance  Bathing,Feeding,Dressing Bathing Assistance: Limited assistance Feeding assistance: Independent Dressing Assistance: Limited assistance     Functional Limitations Info  Sight,Hearing,Speech Sight Info: Adequate Hearing Info: Adequate Speech Info: Adequate    SPECIAL CARE FACTORS FREQUENCY  PT (By licensed PT),OT (By licensed OT)     PT Frequency: 5x week OT Frequency: 5x week            Contractures Contractures Info: Not present    Additional Factors Info  Code Status,Allergies Code Status Info: DNR Allergies Info: Hydrocodone           Current Medications (10/09/2020):  This is the current hospital active medication list Current Facility-Administered Medications  Medication Dose Route Frequency Provider Last Rate Last Admin  . 0.9 %  sodium chloride infusion  100 mL Intravenous PRN Oretha Milch D, MD      . 0.9 %  sodium chloride infusion  100 mL Intravenous PRN Desiree Hane, MD      .  alteplase (CATHFLO ACTIVASE) injection 2 mg  2 mg Intracatheter Once PRN Oretha Milch D, MD      . amLODipine (NORVASC) tablet 5 mg  5 mg Oral Daily Darliss Cheney, MD   5 mg at 10/08/20 1326  . aspirin chewable tablet 81 mg  81 mg Oral Daily Darliss Cheney, MD   81 mg at 10/08/20 0914  . carvedilol (COREG) tablet 6.25 mg  6.25 mg Oral BID WC  Darliss Cheney, MD   6.25 mg at 10/09/20 0740  . cefTRIAXone (ROCEPHIN) 1 g in sodium chloride 0.9 % 100 mL IVPB  1 g Intravenous Q24H Darliss Cheney, MD 200 mL/hr at 10/08/20 1613 1 g at 10/08/20 1613  . Chlorhexidine Gluconate Cloth 2 % PADS 6 each  6 each Topical Daily Darliss Cheney, MD   6 each at 10/08/20 1109  . cloNIDine (CATAPRES) tablet 0.1 mg  0.1 mg Oral Q8H PRN Oretha Milch D, MD      . doxercalciferol (HECTOROL) injection 5 mcg  5 mcg Intravenous Q T,Th,Sa-HD Gean Quint, MD   5 mcg at 10/06/20 1643  . famotidine (PEPCID) tablet 10 mg  10 mg Oral q AM Oretha Milch D, MD   10 mg at 10/09/20 0745  . FLUoxetine (PROZAC) capsule 20 mg  20 mg Oral Daily Oretha Milch D, MD   20 mg at 10/08/20 0915  . Gerhardt's butt cream   Topical QID PRN Darliss Cheney, MD   1 application at [id number]  . Gerhardt's butt cream   Topical QID Darliss Cheney, MD   1 application at [id number]  . heparin injection 1,000 Units  1,000 Units Dialysis PRN Oretha Milch D, MD      . hydrALAZINE (APRESOLINE) injection 10 mg  10 mg Intravenous Q6H PRN Darliss Cheney, MD   10 mg at 10/09/20 0809  . insulin aspart (novoLOG) injection 0-6 Units  0-6 Units Subcutaneous TID WC Oretha Milch D, MD   3 Units at 10/08/20 1323  . lidocaine (PF) (XYLOCAINE) 1 % injection 5 mL  5 mL Intradermal PRN Oretha Milch D, MD      . lidocaine-prilocaine (EMLA) cream 1 application  1 application Topical PRN Oretha Milch D, MD      . loperamide (IMODIUM) capsule 4 mg  4 mg Oral PRN Darliss Cheney, MD   4 mg at 10/07/20 1622  . metroNIDAZOLE (FLAGYL) IVPB 500 mg  500 mg Intravenous Q8H Oretha Milch D, MD 100 mL/hr at 10/09/20 0741 500 mg at 10/09/20 0741  . midodrine (PROAMATINE) tablet 10 mg  10 mg Oral Q T,Th,Sa-HD Oretha Milch D, MD   10 mg at 10/09/20 [id number]  . multivitamin with minerals tablet 1 tablet  1 tablet Oral Daily Oretha Milch D, MD   1 tablet at 10/08/20 0915  . ondansetron (ZOFRAN) tablet 4 mg  4 mg Oral Q6H  PRN Oretha Milch D, MD       Or  . ondansetron (ZOFRAN) injection 4 mg  4 mg Intravenous Q6H PRN Oretha Milch D, MD      . pantoprazole (PROTONIX) EC tablet 40 mg  40 mg Oral Daily Oretha Milch D, MD   40 mg at 10/08/20 0915  . pentafluoroprop-tetrafluoroeth (GEBAUERS) aerosol 1 application  1 application Topical PRN Oretha Milch D, MD      . polyethylene glycol (MIRALAX / GLYCOLAX) packet 17 g  17 g Oral Daily Oretha Milch D, MD   17 g at 10/08/20 0916  . senna-docusate (  Senokot-S) tablet 1 tablet  1 tablet Oral QHS PRN Oretha Milch D, MD      . sevelamer carbonate (RENVELA) tablet 800 mg  800 mg Oral TID WC Oretha Milch D, MD   800 mg at 10/09/20 0741     Discharge Medications: Please see discharge summary for a list of discharge medications.  Relevant Imaging Results:  Relevant Lab Results:   Additional Information [id number]  Joanne Chars, LCSW

## 2020-10-09 NOTE — Discharge Instructions (Signed)
Colitis  Colitis is a condition in which the colon is inflamed. It can cause diarrhea, blood in the stool, and abdominal pain. Colitis can last a short time (be acute), or it may last a long time (become chronic). What are the causes? This condition may be caused by:  Infections from viruses or bacteria.  A reaction to medicine.  Certain autoimmune diseases, such as Crohn's disease or ulcerative colitis.  Radiation treatment.  Decreased blood flow to the bowel (ischemia). What are the signs or symptoms? Symptoms of this condition include:  Diarrhea, blood in the stool, or black, tarry stool.  Pain in the joints or abdominal pain.  Fever or fatigue.  Vomiting.  Weight loss.  Bloating.  Having fewer bowel movements than usual.  A strong and sudden urge to have a bowel movement.  Feeling like the bowel is not empty after a bowel movement. How is this diagnosed? This condition may be diagnosed based on a stool test and a blood test. You may also have other tests, such as:  X-rays.  CT scan.  Colonoscopy.  Endoscopy.  Biopsy. How is this treated? Treatment for this condition depends on the cause. This condition may be treated with:  Steps to rest the bowel, such as not eating or drinking for a period of time.  Fluids that are given through an IV.  Medicine for pain and diarrhea.  Antibiotic medicines.  Cortisone medicines.  Surgery. Follow these instructions at home: Eating and drinking  Follow instructions from your health care provider about eating or drinking restrictions.  Drink enough fluid to keep your urine pale yellow.  Work with a dietitian to determine whether certain foods cause your condition to flare up.  Avoid foods or drinks that cause flare-ups.  Eat a well-balanced diet.   General instructions  If you were prescribed an antibiotic medicine, take it as told by your health care provider. Do not stop taking the antibiotic even if you  start to feel better.  Take over-the-counter and prescription medicines only as told by your health care provider.  Keep all follow-up visits. This is important. Contact a health care provider if:  Your symptoms do not go away.  You develop new symptoms. Get help right away if:  You have a fever that does not go away with treatment.  You develop chills.  You have extreme weakness, fainting, or dehydration.  You vomit repeatedly.  You develop severe pain in your abdomen.  You pass bloody or tarry stool. Summary  Colitis is a condition in which the colon is inflamed. Colitis can last a short time (be acute), or it may last a long time (become chronic).  Treatment for this condition depends on the cause and may include resting the bowel, taking medicines, or having surgery.  If you were prescribed an antibiotic medicine, take it as told by your health care provider. Do not stop taking the antibiotic even if you start to feel better.  Get help right away if you develop severe pain in your abdomen.  Keep all follow-up visits. This is important. This information is not intended to replace advice given to you by your health care provider. Make sure you discuss any questions you have with your health care provider. Document Revised: 02/14/2020 Document Reviewed: 02/14/2020 Elsevier Patient Education  2021 [employer]  

## 2020-10-09 NOTE — Discharge Summary (Signed)
Physician Discharge Summary  [name] Michael [id number] DOB: 1961/06/15 DOA: 10/05/2020  PCP: Charlott Rakes, MD  Admit date: 10/05/2020 Discharge date: 10/09/2020 30 Day Unplanned Readmission Risk Score   Flowsheet Row ED to Hosp-Admission (Current) from 10/05/2020 in [facility]  30 Day Unplanned Readmission Risk Score (%) 24.9 Filed at 10/09/2020 0801     This score is the patient's risk of an unplanned readmission within 30 days of being discharged (0 -100%). The score is based on dignosis, age, lab data, medications, orders, and past utilization.   Low:  0-14.9   Medium: 15-21.9   High: 22-29.9   Extreme: 30 and above         Admitted From: Assisted living facility Disposition: SNF  Recommendations for Outpatient Follow-up:  1. Follow up with PCP in 1-2 weeks 2. Please obtain BMP/CBC in one week 3. Please follow up with your PCP on the following pending results: Unresulted Labs (From admission, onward)          Start     Ordered   10/05/20 1449  Urine culture  (Septic presentation on arrival (screening labs, nursing and treatment orders for obvious sepsis))  ONCE - STAT,   STAT        10/05/20 [place]: None Equipment/Devices: None  Discharge Condition: Stable CODE STATUS: DNR Diet recommendation: Low-sodium  Subjective: Seen and examined.  Feels much better.  Fully alert and oriented.  Brief/Interim Summary: [name] Michael is a 60 year old female with medical history significant for ESRD on HD, HTN, HLD who presented from her assisted living facility [facility] due to missed HD sessions for the past week as she has reportedly been unable to complete full sessions due to reported worsening weakness and increased oxygen requirements.   Patient was last admitted to [facility] from 3/1-3/3 for acute metabolic encephalopathy presumed secondary to missed HD sessions as well as polypharmacy from home gabapentin, Topamax and  Prozac at that time. Infectious work-up was unremarkable and no neurologic etiology for encephalopathy found CT head and MRI brain at that time.  In the ED patient was found to be afebrile with initial SPO2 of 83% requiring 15 L of oxygen support before downtrending to 3 L and maintaining sat 93%. Initial blood pressure 206/81, now down trended to 179/87 with heart rate range 59-56. BMP showed potassium of 5.2, creatinine 7.6, glucose 143, VBG PO2 of 30, pH 7.3, lactic acid 5.3, platelets 72 (baseline 295). Chest x-ray showed chronic bronchitic changes with enlargement of cardiac silhouette. Patient was started empirically on vancomycin, Flagyl, cefepime in the ED due to concern for hidden infection in the setting of elevated lactic acidosis.  She was admitted under hospitalist service with multiple diagnoses including weakness, acute encephalopathy which was likely uremic, acute hypoxic respiratory failure, hypertensive urgency, acute thrombocytopenia.  She was then diagnosed with acute colitis based off of the CT abdomen which showed ascending and proximal transverse colon inflammation.  She was ruled out of GI pathogen panel and C. difficile.  Antibiotics were deescalated to cefepime and Flagyl and then Rocephin and Flagyl.  Nephrology was also consulted in the beginning.  She received dialysis.  After that, she became more alert and encephalopathy resolved.  Patient was evaluated by PT OT who recommended discharging to SNF.  Patient is fully alert and oriented today, does not have any abdominal pain or tenderness on examination.  Diarrhea has improved.  She is being discharged to skilled nursing facility today after her dialysis.  She has been weaned down to room air as well.  Her thrombocytopenia has remained stable around 53.  Her blood pressure is within normal range.  We increased her carvedilol to 3.25 twice daily and added amlodipine to her regimen.  She was also seen by palliative care during this  hospitalization.  She chose to be DNR.  She is being discharged on 7 more days of oral ciprofloxacin and Flagyl to complete the course for acute colitis which is likely infectious.  Of note, patient was also on low-dose oxycodone.  Due to her recurrent hospitalizations due to acute encephalopathy which is also considered as part by polypharmacy and the fact that patient did not require this medication here, I have taken the liberty and have discontinued this medication at discharge.  Following instructions copied pasted from wound care nurse. [place] Nurse Consult Note: Patient receiving care in [facility] 2W08. Primary RN present at time of my assessment. 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 Thank you for the consult.  Discussed plan of care with the patient and bedside nurse.  [facility] nurse will not follow at this time.  Please re-consult the [facility] team if needed.  Discharge Diagnoses:  Active Problems:   Hyperlipidemia   Essential hypertension   Depression   Chronic diastolic CHF (congestive heart failure) (HCC)   Anemia of chronic disease   ESRD (end stage renal disease) (HCC)   Type II diabetes mellitus (HCC)   Severe tricuspid regurgitation   Acute metabolic encephalopathy   Lactic acidosis   Transaminitis   Thrombocytopenia (HCC)   Acute respiratory failure with hypoxia [facility])    Discharge Instructions   Allergies as of 10/09/2020      Reactions   Hydrocodone Nausea And Vomiting, Other (See Comments)   "Allergic," per [facility]      Medication List    STOP taking these medications   oxyCODONE 5 MG immediate release tablet Commonly known as: Oxy IR/ROXICODONE      TAKE these medications   amLODipine 5 MG tablet Commonly known as: NORVASC Take 1 tablet (5 mg total) by mouth daily for 7 days.   aspirin 81 MG chewable tablet Chew 81 mg by mouth daily.   atorvastatin 40 MG tablet Commonly known as: LIPITOR Take 40 mg by mouth at bedtime.   carvedilol 6.25 MG tablet Commonly known as: COREG Take 1 tablet (6.25 mg total) by mouth 2 (two) times daily with a meal. What changed:   medication strength  how much to take   CertaVite Senior Tabs Take 1 tablet by mouth daily.   ciprofloxacin 250 MG tablet Commonly known as: CIPRO Take 1 tablet (250 mg total) by mouth daily with breakfast for 7 days.   cloNIDine 0.1 MG tablet Commonly known as: CATAPRES Take 0.1 mg by mouth every 8 (eight) hours as needed (BP>160/90).   famotidine 10 MG tablet Commonly known as: PEPCID Take 10 mg by mouth in the morning.   feeding supplement (PRO-STAT SUGAR FREE 64) Liqd Take 30 mLs by mouth in the morning. WILD CHERRY FLAVOR   FLUoxetine 20 MG tablet Commonly known as: PROZAC Take 20 mg by mouth daily.   HumaLOG 100 UNIT/ML injection Generic drug: insulin  lispro Inject 0.05 mLs (5 Units total) into the skin 3 (three) times daily with meals.   metroNIDAZOLE 500 MG tablet Commonly known as: Flagyl Take 1 tablet (500 mg total) by mouth 3 (three) times daily for 7 days.   midodrine 10 MG tablet Commonly known as: PROAMATINE Take 10 mg by mouth as directed. Take 1 tablet (10 mg) prior to Dialysis   omega-3 acid ethyl esters 1 g capsule Commonly known as: LOVAZA Take 2 capsules by mouth 2 (two) times daily.   omeprazole 40 MG capsule Commonly known as: PRILOSEC Take 40 mg by mouth daily before breakfast.   polyethylene glycol powder 17 GM/SCOOP powder Commonly known as: GLYCOLAX/MIRALAX Take 17 g by mouth See admin instructions. Mix 17 grams of powder into 8 ounces of fluid, stir, and drink (by mouth) once a day   SACCHAROMYCES BOULARDII  PO Take 1 capsule by mouth 2 (two) times daily.   senna-docusate 8.6-50 MG tablet Commonly known as: Senokot-S Take 1 tablet by mouth at bedtime as needed for mild constipation.   sevelamer carbonate 800 MG tablet Commonly known as: RENVELA Take 800 mg by mouth 3 (three) times daily with meals.   sodium bicarbonate 650 MG tablet Take 1,300 mg by mouth 2 (two) times daily.   Vitamin D (Ergocalciferol) 1.25 MG (50000 UNIT) Caps capsule Commonly known as: DRISDOL Take 1 capsule (50,000 Units total) by mouth every 7 (seven) days.       Follow-up Information    Charlott Rakes, MD Follow up in 1 week(s).   Specialty: Family Medicine Contact information: [place] [address] [phone]        Fay Records, MD .   Specialty: Cardiology Contact information: [address] [place] [address] [phone]              Allergies  Allergen Reactions  . Hydrocodone Nausea And Vomiting and Other (See Comments)    "Allergic," per [facility]    Consultations: Nephrology   Procedures/Studies: CT Head Wo Contrast  Result Date: 10/05/2020 CLINICAL DATA:  Altered mental status and weakness EXAM: CT HEAD WITHOUT CONTRAST TECHNIQUE: Contiguous axial images were obtained from the base of the skull through the vertex without intravenous contrast. COMPARISON:  08/21/2020 FINDINGS: Brain: No evidence of acute infarction, hemorrhage, hydrocephalus, extra-axial collection or mass lesion/mass effect. Chronic atrophic and ischemic changes are again noted and stable. 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 Vascular: No hyperdense vessel or unexpected calcification. Skull: Normal. Negative for fracture or focal lesion. Sinuses/Orbits: No acute finding. Other: None. IMPRESSION: Chronic atrophic and ischemic changes without acute intracranial abnormality. Electronically Signed   By: Inez Catalina M.D.   On: 10/05/2020 19:14   US  Abdomen Complete  Result Date: 10/05/2020 CLINICAL DATA:  Abdominal pain, elevated liver function tests, elevated bilirubin EXAM: ABDOMEN ULTRASOUND COMPLETE COMPARISON:  04/28/2019 FINDINGS: Gallbladder: Not visualized. Common bile duct: Diameter: 3 mm Liver: Mild heterogeneous increased liver echotexture is nonspecific, but could reflect hepatic steatosis. No focal liver abnormality. No intrahepatic biliary duct dilation. Portal vein is patent on color Doppler imaging with normal direction of blood flow towards the liver. IVC: Not well visualized due to bowel gas Pancreas: Visualized portion unremarkable. Spleen: Size and appearance within normal limits. Right Kidney: Length: 7.5 cm. Echogenicity within normal limits. No mass or hydronephrosis visualized. Left Kidney: Length: 6.7 cm. Echogenicity within normal limits. No mass or hydronephrosis visualized. Abdominal aorta: Proximal aorta is unremarkable.  Distal aorta and bifurcation not visualized due to bowel gas. Other findings: Incidental right pleural effusion. IMPRESSION: 1. Mild heterogeneous increased liver echotexture compatible with hepatic steatosis. 2. Nonvisualization of the gallbladder, likely due to decompressed state. 3. Limited study due to bowel gas. 4. Right pleural effusion. Electronically Signed   By: Randa Ngo M.D.   On: 10/05/2020 18:45   CT ABDOMEN PELVIS W CONTRAST  Result Date: 10/05/2020 CLINICAL DATA:  Abdominal abscess/infection suspected. Mental status changes. Weakness. Dialysis patient without full dialysis treatment since last Thursday. EXAM: CT ABDOMEN AND PELVIS WITH CONTRAST TECHNIQUE: Multidetector CT imaging of the abdomen and pelvis was performed using the standard protocol following bolus administration of intravenous contrast. CONTRAST:  81m OMNIPAQUE IOHEXOL 300 MG/ML  SOLN COMPARISON:  04/28/2019 FINDINGS: Lower chest: There are bilateral pleural effusions. The heart is enlarged. No pericardial effusion.  Hepatobiliary: Small amount of ascites surrounding the liver. Liver is mildly heterogeneous without focal lesion. Gallbladder is decompressed. Pancreas: Unremarkable. No pancreatic ductal dilatation or surrounding inflammatory changes. Spleen: Normal in size without focal abnormality. Adrenals/Urinary Tract: Normal appearance of both adrenal glands. Small bilateral kidneys. Mild bilateral perinephric nonspecific stranding. There is no hydronephrosis or suspicious renal mass. The ureters are unremarkable. The bladder and visualized portion of the urethra are normal. Stomach/Bowel: Stomach is unremarkable. Small bowel loops are unremarkable. There is thickening of the ascending and proximal transverse colon consistent with colitis. The appendix is well seen and has a normal appearance. Vascular/Lymphatic: There is dense atherosclerotic calcification of the abdominal aorta. Although involved by atherosclerosis, there is vascular opacification of the celiac axis, superior mesenteric artery, and inferior mesenteric artery. Normal appearance of the portal venous system and inferior vena cava. The No retroperitoneal or mesenteric adenopathy. Reproductive: Uterus is present. A 1.5 centimeter low-attenuation lesion is identified within the LEFT ovary. The region of the RIGHT ovary is unremarkable. A small amount of free pelvic fluid. Other: Anterior abdominal wall is unremarkable. Musculoskeletal: No acute or significant osseous findings. IMPRESSION: 1. Thickening of the ascending and proximal transverse colon consistent with colitis, likely infectious or inflammatory. 2. Small amount of ascites. 3. Bilateral pleural effusions. 4. Cardiomegaly. 5. Small bilateral kidneys. 6. 1.5 centimeter low-attenuation lesion within the LEFT ovary, likely a cyst. No follow-up imaging recommended. Note: This recommendation does not apply to premenarchal patients and to those with increased risk (genetic, family history, elevated tumor  markers or other high-risk factors) of ovarian cancer. Reference: JACR 2020 Feb; 17(2):248-254 7. Liver is mildly heterogeneous without focal lesion. 8. Aortic Atherosclerosis (ICD10-I70.0). Electronically Signed   By: ENolon NationsM.D.   On: 10/05/2020 19:23   DG Chest Portable 1 View  Result Date: 10/05/2020 CLINICAL DATA:  Weakness and shortness of breath EXAM: PORTABLE CHEST 1 VIEW COMPARISON:  Portable exam 1323 hours compared to 08/21/2020 FINDINGS: Enlargement of cardiac silhouette. Mediastinal contours and pulmonary vascularity normal. Atherosclerotic calcification aorta. Chronic peribronchial thickening. No pulmonary infiltrate, pleural effusion, or pneumothorax. Eventration RIGHT diaphragm stable. Bones demineralized. IMPRESSION: Chronic bronchitic changes without infiltrate. Enlargement of cardiac silhouette. Aortic Atherosclerosis (ICD10-I70.0). Electronically Signed   By: MLavonia DanaM.D.   On: 10/05/2020 13:51   ECHOCARDIOGRAM COMPLETE  Result Date: 10/05/2020    ECHOCARDIOGRAM REPORT   Patient Name:   DTAKEMA KRAMPITZDate of Exam: 10/05/2020 Medical Rec #:  [id number]     Height:       54.0 in Accession #:    [id number]    Weight:  98.0 lb Date of Birth:  03-08-1961      BSA:          1.277 m Patient Age:    16 years       BP:           163/53 mmHg Patient Gender: F              HR:           67 bpm. Exam Location:  Inpatient Procedure: 2D Echo Indications:    Cardiomegaly  History:        Patient has prior history of Echocardiogram examinations, most                 recent 12/03/2019. End stage renal disease; Risk                 Factors:Hypertension, Dyslipidemia and Diabetes.  Sonographer:    Johny Chess Referring Phys: [id number] [place]  1. Left ventricular ejection fraction, by estimation, is 50 to 55%. The left ventricle has low normal function. The left ventricle has no regional wall motion abnormalities. Left ventricular diastolic parameters are  indeterminate. There is the interventricular septum is flattened in systole and diastole, consistent with right ventricular pressure and volume overload.  2. Right ventricular systolic function is mildly reduced. The right ventricular size is severely enlarged.  3. Left atrial size was moderately dilated.  4. Right atrial size was moderately dilated.  5. A small pericardial effusion is present.  6. The mitral valve is normal in structure. Mild to moderate mitral valve regurgitation.  7. 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  8. The aortic valve is tricuspid. Aortic valve regurgitation is not visualized. No aortic stenosis is present.  9. The inferior vena cava is dilated in size with <50% respiratory variability, suggesting right atrial pressure of 15 mmHg. Comparison(s): Changes from prior study are noted. EF slightly decreased from prior. Conclusion(s)/Recommendation(s): Severe TR, with non-coaptation of leaflets. This likely causes the RVSP to be inaccurate due to partial equalization of pressures. Ascites also seen on subcostal imaging. FINDINGS  Left Ventricle: Left ventricular ejection fraction, by estimation, is 50 to 55%. The left ventricle has low normal function. The left ventricle has no regional wall motion abnormalities. The left ventricular internal cavity size was normal in size. There is no left ventricular hypertrophy. The interventricular septum is flattened in systole and diastole, consistent with right ventricular pressure and volume overload. Left ventricular diastolic parameters are indeterminate. Right Ventricle: RVSP inaccurate due to possibility of equalization of RV/RA pressure from severe TR. The right ventricular size is severely enlarged. Right vetricular wall thickness was not well visualized. Right ventricular systolic function is mildly reduced. The tricuspid regurgitant velocity is 2.13 m/s, and with an assumed right atrial  pressure of 15 mmHg, the estimated right ventricular systolic pressure is [id number] mmHg. Left Atrium: Left atrial size was moderately dilated. Right Atrium: Right atrial size was moderately dilated. Pericardium: A small pericardial effusion is present. Mitral Valve: The mitral valve is normal in structure. Mild to moderate mitral valve regurgitation. 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 The tricuspid valve is normal in structure. 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 Aortic Valve: The aortic valve is tricuspid. Aortic valve regurgitation is not visualized. No aortic stenosis is present. Pulmonic Valve: The pulmonic valve was not well visualized. Pulmonic valve regurgitation is not visualized. Aorta: The aortic root, ascending  aorta and aortic arch are all structurally normal, with no evidence of dilitation or obstruction. Venous: The inferior vena cava is dilated in size with less than 50% respiratory variability, suggesting right atrial pressure of 15 mmHg. IAS/Shunts: The atrial septum is grossly normal. Additional Comments: Moderate ascites is present.  LEFT VENTRICLE PLAX 2D LVIDd:         4.40 cm     Diastology LVIDs:         3.30 cm     LV e' medial:    4.90 cm/s LV PW:         1.00 cm     LV E/e' medial:  14.3 LV IVS:        0.80 cm     LV e' lateral:   7.51 cm/s LVOT diam:     1.50 cm     LV E/e' lateral: 9.4 LV SV:         27 LV SV Index:   21 LVOT Area:     1.77 cm  LV Volumes (MOD) LV vol d, MOD A4C: 69.1 ml LV vol s, MOD A4C: 38.3 ml LV SV MOD A4C:     69.1 ml RIGHT VENTRICLE            IVC RV S prime:     9.36 cm/s  IVC diam: 2.20 cm TAPSE (M-mode): 1.7 cm LEFT ATRIUM             Index       RIGHT ATRIUM           Index LA diam:        3.80 cm 2.97 cm/m  RA Area:     15.10 cm LA Vol (A2C):   43.2 ml 33.82 ml/m RA Volume:   36.10 ml  28.26 ml/m LA Vol (A4C):   39.8 ml 31.16 ml/m LA Biplane Vol: 42.6 ml 33.35 ml/m  AORTIC VALVE LVOT Vmax:   74.40 cm/s LVOT Vmean:   44.800 cm/s LVOT VTI:    0.150 m  AORTA Ao Root diam: 2.50 cm Ao Asc diam:  2.40 cm MITRAL VALVE               TRICUSPID VALVE MV Area (PHT): 4.60 cm    TR Peak grad:   18.1 mmHg MV Decel Time: 165 msec    TR Vmax:        213.00 cm/s MV E velocity: 70.30 cm/s MV A velocity: 68.60 cm/s  SHUNTS MV E/A ratio:  1.02        Systemic VTI:  0.15 m                            Systemic Diam: 1.50 cm Buford Dresser MD Electronically signed by Buford Dresser MD Signature Date/Time: 10/05/2020/7:59:01 PM    Final      Discharge Exam: Vitals:   10/09/20 0859 10/09/20 0905  BP: 137/62 (!) 132/46  Pulse: 81 76  Resp: 14 14  Temp:    SpO2: 98% 100%   Vitals:   10/09/20 0845 10/09/20 0854 10/09/20 0859 10/09/20 0905  BP: (!) 157/70 137/62 137/62 (!) 132/46  Pulse: 81 82 81 76  Resp: '16 17 14 14  '$ Temp: 98.1 F (36.7 C)     TempSrc: Oral     SpO2: 99% 98% 98% 100%  Weight:      Height:        General: Pt is alert,  awake, not in acute distress Cardiovascular: RRR, S1/S2 +, no rubs, no gallops Respiratory: CTA bilaterally, no wheezing, no rhonchi Abdominal: Soft, NT, ND, bowel sounds + Extremities: no edema, no cyanosis    The results of significant diagnostics from this hospitalization (including imaging, microbiology, ancillary and laboratory) are listed below for reference.     Microbiology: Recent Results (from the past 240 hour(s))  Resp Panel by RT-PCR (Flu A&B, Covid) Nasopharyngeal Swab     Status: None   Collection Time: 10/05/20  1:11 PM   Specimen: Nasopharyngeal Swab; Nasopharyngeal(NP) swabs in vial transport medium  Result Value Ref Range Status   SARS Coronavirus 2 by RT PCR NEGATIVE NEGATIVE Final    Comment: (NOTE) SARS-CoV-2 target nucleic acids are NOT DETECTED.  The SARS-CoV-2 RNA is generally detectable in upper respiratory specimens during the acute phase of infection. The lowest concentration of SARS-CoV-2 viral copies this assay can detect is 138  copies/mL. A negative result does not preclude SARS-Cov-2 infection and should not be used as the sole basis for treatment or other patient management decisions. A negative result may occur with  improper specimen collection/handling, submission of specimen other than nasopharyngeal swab, presence of viral mutation(s) within the areas targeted by this assay, and inadequate number of viral copies(<138 copies/mL). A negative result must be combined with clinical observations, patient history, and epidemiological information. The expected result is Negative.  Fact Sheet for Patients:  EntrepreneurPulse.com.au  Fact Sheet for Healthcare Providers:  IncredibleEmployment.be  This test is no t yet approved or cleared by the [place] FDA and  has been authorized for detection and/or diagnosis of SARS-CoV-2 by FDA under an Emergency Use Authorization (EUA). This EUA will remain  in effect (meaning this test can be used) for the duration of the COVID-19 declaration under Section 564(b)(1) of the Act, 21 U.S.C.section 360bbb-3(b)(1), unless the authorization is terminated  or revoked sooner.       Influenza A by PCR NEGATIVE NEGATIVE Final   Influenza B by PCR NEGATIVE NEGATIVE Final    Comment: (NOTE) The Xpert Xpress SARS-CoV-2/FLU/RSV plus assay is intended as an aid in the diagnosis of influenza from Nasopharyngeal swab specimens and should not be used as a sole basis for treatment. Nasal washings and aspirates are unacceptable for Xpert Xpress SARS-CoV-2/FLU/RSV testing.  Fact Sheet for Patients: EntrepreneurPulse.com.au  Fact Sheet for Healthcare Providers: IncredibleEmployment.be  This test is not yet approved or cleared by the [place] FDA and has been authorized for detection and/or diagnosis of SARS-CoV-2 by FDA under an Emergency Use Authorization (EUA). This EUA will remain in effect (meaning  this test can be used) for the duration of the COVID-19 declaration under Section 564(b)(1) of the Act, 21 U.S.C. section 360bbb-3(b)(1), unless the authorization is terminated or revoked.  Performed at [facility], [place] [address], [place], [place] [address]   Blood Culture (routine x 2)     Status: None (Preliminary result)   Collection Time: 10/05/20  3:40 PM   Specimen: BLOOD RIGHT FOREARM  Result Value Ref Range Status   Specimen Description BLOOD RIGHT FOREARM  Final   Special Requests   Final    BOTTLES DRAWN AEROBIC AND ANAEROBIC Blood Culture results may not be optimal due to an inadequate volume of blood received in culture bottles   Culture   Final    NO GROWTH 4 DAYS Performed at [facility], [place] [address], [place],  [address]    Report Status  PENDING  Incomplete  Blood Culture (routine x 2)     Status: None (Preliminary result)   Collection Time: 10/05/20 10:10 PM   Specimen: BLOOD RIGHT ARM  Result Value Ref Range Status   Specimen Description BLOOD RIGHT ARM  Final   Special Requests   Final    BOTTLES DRAWN AEROBIC AND ANAEROBIC Blood Culture adequate volume   Culture   Final    NO GROWTH 3 DAYS Performed at [facility], [address], [place], [place] [address]    Report Status PENDING  Incomplete  C Difficile Quick Screen w PCR reflex     Status: None   Collection Time: 10/07/20  5:00 AM   Specimen: STOOL  Result Value Ref Range Status   C Diff antigen NEGATIVE NEGATIVE Final   C Diff toxin NEGATIVE NEGATIVE Final   C Diff interpretation No C. difficile detected.  Final    Comment: Performed at [facility], [place] [address] [place], [place] [address]  Gastrointestinal Panel by PCR , Stool     Status: None   Collection Time: 10/07/20  5:00 AM   Specimen: Stool  Result Value Ref Range Status   Campylobacter species NOT DETECTED NOT DETECTED Final   Plesimonas shigelloides NOT DETECTED NOT DETECTED Final   Salmonella  species NOT DETECTED NOT DETECTED Final   Yersinia enterocolitica NOT DETECTED NOT DETECTED Final   Vibrio species NOT DETECTED NOT DETECTED Final   Vibrio cholerae NOT DETECTED NOT DETECTED Final   Enteroaggregative E coli (EAEC) NOT DETECTED NOT DETECTED Final   Enteropathogenic E coli (EPEC) NOT DETECTED NOT DETECTED Final   Enterotoxigenic E coli (ETEC) NOT DETECTED NOT DETECTED Final   Shiga like toxin producing E coli (STEC) NOT DETECTED NOT DETECTED Final   Shigella/Enteroinvasive E coli (EIEC) NOT DETECTED NOT DETECTED Final   Cryptosporidium NOT DETECTED NOT DETECTED Final   Cyclospora cayetanensis NOT DETECTED NOT DETECTED Final   Entamoeba histolytica NOT DETECTED NOT DETECTED Final   Giardia lamblia NOT DETECTED NOT DETECTED Final   Adenovirus F40/41 NOT DETECTED NOT DETECTED Final   Astrovirus NOT DETECTED NOT DETECTED Final   Norovirus GI/GII NOT DETECTED NOT DETECTED Final   Rotavirus A NOT DETECTED NOT DETECTED Final   Sapovirus (I, II, IV, and V) NOT DETECTED NOT DETECTED Final    Comment: Performed at [facility], [address] [place], [place] [address]  SARS CORONAVIRUS 2 (TAT 6-24 HRS) Nasopharyngeal Nasopharyngeal Swab     Status: None   Collection Time: 10/08/20  4:10 PM   Specimen: Nasopharyngeal Swab  Result Value Ref Range Status   SARS Coronavirus 2 NEGATIVE NEGATIVE Final    Comment: (NOTE) SARS-CoV-2 target nucleic acids are NOT DETECTED.  The SARS-CoV-2 RNA is generally detectable in upper and lower respiratory specimens during the acute phase of infection. Negative results do not preclude SARS-CoV-2 infection, do not rule out co-infections with other pathogens, and should not be used as the sole basis for treatment or other patient management decisions. Negative results must be combined with clinical observations, patient history, and epidemiological information. The expected result is Negative.  Fact Sheet for  Patients: SugarRoll.be  Fact Sheet for Healthcare Providers: https://www.woods-mathews.com/  This test is not yet approved or cleared by the [place] FDA and  has been authorized for detection and/or diagnosis of SARS-CoV-2 by FDA under an Emergency Use Authorization (EUA). This EUA will remain  in effect (meaning this test can be used)  for the duration of the COVID-19 declaration under Se ction 564(b)(1) of the Act, 21 U.S.C. section 360bbb-3(b)(1), unless the authorization is terminated or revoked sooner.  Performed at [facility], [place] [address], [place], [place] [address]      Labs: BNP (last 3 results) Recent Labs    12/03/19 0329  BNP 0000000*   Basic Metabolic Panel: Recent Labs  Lab 10/05/20 1309 10/05/20 1401 10/05/20 1412 10/05/20 2210 10/06/20 0810 10/07/20 0414 10/08/20 0407 10/09/20 0242  NA 141   < > 140  --  139 136 134* 134*  K 5.0   < > 5.2*  --  3.1* 2.7* 3.6 3.3*  CL 100  --  101  --  99 99 103 101  CO2 22  --   --   --  '27 29 23 24  '$ GLUCOSE 160*  --  143*  --  73 120* 129* 123*  BUN 48*  --  61*  --  13 <5* 10 15  CREATININE 8.00*  --  7.60*  --  3.44* 1.82* 3.43* 4.33*  CALCIUM 9.1  --   --   --  8.3* 8.4* 8.6* 9.1  MG  --   --   --  2.6*  --  1.8  --   --    < > = values in this interval not displayed.   Liver Function Tests: Recent Labs  Lab 10/05/20 1309 10/06/20 0810 10/07/20 0414 10/08/20 0407 10/09/20 0242  AST 135* 120* 93* 66* 44*  ALT 84* 81* 72* 64* 57*  ALKPHOS 90 79 77 71 71  BILITOT 2.2* 2.1* 1.9* 1.6* 1.4*  PROT 6.3* 5.3* 5.0* 5.1* 4.8*  ALBUMIN 2.9* 2.4* 2.2* 2.2* 2.1*   Recent Labs  Lab 10/05/20 2210  LIPASE 44   No results for input(s): AMMONIA in the last 168 hours. CBC: Recent Labs  Lab 10/05/20 1309 10/05/20 1401 10/05/20 1412 10/06/20 0810 10/07/20 0414 10/08/20 0407 10/09/20 0242  WBC 8.3  --   --  6.6 4.1 4.7 4.5  NEUTROABS 6.3  --   --  4.9  2.7 3.2  --   HGB 11.6*   < > 14.6 10.4* 10.4* 10.6* 10.2*  HCT 39.5   < > 43.0 34.1* 33.5* 35.2* 33.8*  MCV 104.2*  --   --  100.3* 96.3 100.3* 98.3  PLT 72*  --   --  65* 60* 53* 53*   < > = values in this interval not displayed.   Cardiac Enzymes: No results for input(s): CKTOTAL, CKMB, CKMBINDEX, TROPONINI in the last 168 hours. BNP: Invalid input(s): POCBNP CBG: Recent Labs  Lab 10/07/20 1548 10/08/20 0753 10/08/20 1226 10/08/20 1558 10/08/20 1808  GLUCAP 215* 142* 265* 183* 101*   D-Dimer No results for input(s): DDIMER in the last 72 hours. Hgb A1c No results for input(s): HGBA1C in the last 72 hours. Lipid Profile No results for input(s): CHOL, HDL, LDLCALC, TRIG, CHOLHDL, LDLDIRECT in the last 72 hours. Thyroid function studies Recent Labs    10/06/20 0936  T3FREE 1.4*   Anemia work up No results for input(s): VITAMINB12, FOLATE, FERRITIN, TIBC, IRON, RETICCTPCT in the last 72 hours. Urinalysis    Component Value Date/Time   COLORURINE STRAW (A) 08/21/2020 1154   APPEARANCEUR CLEAR 08/21/2020 1154   LABSPEC 1.002 (L) 08/21/2020 1154   PHURINE 8.0 08/21/2020 1154   GLUCOSEU >=500 (A) 08/21/2020 1154   HGBUR NEGATIVE 08/21/2020 [place] 08/21/2020 1154  BILIRUBINUR n 05/25/2012 1202   KETONESUR NEGATIVE 08/21/2020 1154   PROTEINUR 100 (A) 08/21/2020 1154   UROBILINOGEN 0.2 05/25/2012 1202   UROBILINOGEN 0.2 03/29/2011 0131   NITRITE NEGATIVE 08/21/2020 1154   LEUKOCYTESUR NEGATIVE 08/21/2020 1154   Sepsis Labs Invalid input(s): PROCALCITONIN,  WBC,  LACTICIDVEN Microbiology Recent Results (from the past 240 hour(s))  Resp Panel by RT-PCR (Flu A&B, Covid) Nasopharyngeal Swab     Status: None   Collection Time: 10/05/20  1:11 PM   Specimen: Nasopharyngeal Swab; Nasopharyngeal(NP) swabs in vial transport medium  Result Value Ref Range Status   SARS Coronavirus 2 by RT PCR NEGATIVE NEGATIVE Final    Comment: (NOTE) SARS-CoV-2  target nucleic acids are NOT DETECTED.  The SARS-CoV-2 RNA is generally detectable in upper respiratory specimens during the acute phase of infection. The lowest concentration of SARS-CoV-2 viral copies this assay can detect is 138 copies/mL. A negative result does not preclude SARS-Cov-2 infection and should not be used as the sole basis for treatment or other patient management decisions. A negative result may occur with  improper specimen collection/handling, submission of specimen other than nasopharyngeal swab, presence of viral mutation(s) within the areas targeted by this assay, and inadequate number of viral copies(<138 copies/mL). A negative result must be combined with clinical observations, patient history, and epidemiological information. The expected result is Negative.  Fact Sheet for Patients:  EntrepreneurPulse.com.au  Fact Sheet for Healthcare Providers:  IncredibleEmployment.be  This test is no t yet approved or cleared by the [place] FDA and  has been authorized for detection and/or diagnosis of SARS-CoV-2 by FDA under an Emergency Use Authorization (EUA). This EUA will remain  in effect (meaning this test can be used) for the duration of the COVID-19 declaration under Section 564(b)(1) of the Act, 21 U.S.C.section 360bbb-3(b)(1), unless the authorization is terminated  or revoked sooner.       Influenza A by PCR NEGATIVE NEGATIVE Final   Influenza B by PCR NEGATIVE NEGATIVE Final    Comment: (NOTE) The Xpert Xpress SARS-CoV-2/FLU/RSV plus assay is intended as an aid in the diagnosis of influenza from Nasopharyngeal swab specimens and should not be used as a sole basis for treatment. Nasal washings and aspirates are unacceptable for Xpert Xpress SARS-CoV-2/FLU/RSV testing.  Fact Sheet for Patients: EntrepreneurPulse.com.au  Fact Sheet for Healthcare  Providers: IncredibleEmployment.be  This test is not yet approved or cleared by the [place] FDA and has been authorized for detection and/or diagnosis of SARS-CoV-2 by FDA under an Emergency Use Authorization (EUA). This EUA will remain in effect (meaning this test can be used) for the duration of the COVID-19 declaration under Section 564(b)(1) of the Act, 21 U.S.C. section 360bbb-3(b)(1), unless the authorization is terminated or revoked.  Performed at [facility], [place] [address], [place], [place] [address]   Blood Culture (routine x 2)     Status: None (Preliminary result)   Collection Time: 10/05/20  3:40 PM   Specimen: BLOOD RIGHT FOREARM  Result Value Ref Range Status   Specimen Description BLOOD RIGHT FOREARM  Final   Special Requests   Final    BOTTLES DRAWN AEROBIC AND ANAEROBIC Blood Culture results may not be optimal due to an inadequate volume of blood received in culture bottles   Culture   Final    NO GROWTH 4 DAYS Performed at [facility], [place] [address] [place], [place] [address]    Report Status PENDING  Incomplete  Blood Culture (  routine x 2)     Status: None (Preliminary result)   Collection Time: 10/05/20 10:10 PM   Specimen: BLOOD RIGHT ARM  Result Value Ref Range Status   Specimen Description BLOOD RIGHT ARM  Final   Special Requests   Final    BOTTLES DRAWN AEROBIC AND ANAEROBIC Blood Culture adequate volume   Culture   Final    NO GROWTH 3 DAYS Performed at [facility], [address], [place], [place] [address]    Report Status PENDING  Incomplete  C Difficile Quick Screen w PCR reflex     Status: None   Collection Time: 10/07/20  5:00 AM   Specimen: STOOL  Result Value Ref Range Status   C Diff antigen NEGATIVE NEGATIVE Final   C Diff toxin NEGATIVE NEGATIVE Final   C Diff interpretation No C. difficile detected.  Final    Comment: Performed at [facility], [place] [address], [place], [place]  [address]  Gastrointestinal Panel by PCR , Stool     Status: None   Collection Time: 10/07/20  5:00 AM   Specimen: Stool  Result Value Ref Range Status   Campylobacter species NOT DETECTED NOT DETECTED Final   Plesimonas shigelloides NOT DETECTED NOT DETECTED Final   Salmonella species NOT DETECTED NOT DETECTED Final   Yersinia enterocolitica NOT DETECTED NOT DETECTED Final   Vibrio species NOT DETECTED NOT DETECTED Final   Vibrio cholerae NOT DETECTED NOT DETECTED Final   Enteroaggregative E coli (EAEC) NOT DETECTED NOT DETECTED Final   Enteropathogenic E coli (EPEC) NOT DETECTED NOT DETECTED Final   Enterotoxigenic E coli (ETEC) NOT DETECTED NOT DETECTED Final   Shiga like toxin producing E coli (STEC) NOT DETECTED NOT DETECTED Final   Shigella/Enteroinvasive E coli (EIEC) NOT DETECTED NOT DETECTED Final   Cryptosporidium NOT DETECTED NOT DETECTED Final   Cyclospora cayetanensis NOT DETECTED NOT DETECTED Final   Entamoeba histolytica NOT DETECTED NOT DETECTED Final   Giardia lamblia NOT DETECTED NOT DETECTED Final   Adenovirus F40/41 NOT DETECTED NOT DETECTED Final   Astrovirus NOT DETECTED NOT DETECTED Final   Norovirus GI/GII NOT DETECTED NOT DETECTED Final   Rotavirus A NOT DETECTED NOT DETECTED Final   Sapovirus (I, II, IV, and V) NOT DETECTED NOT DETECTED Final    Comment: Performed at [facility], [place] [place], [place] [address]  SARS CORONAVIRUS 2 (TAT 6-24 HRS) Nasopharyngeal Nasopharyngeal Swab     Status: None   Collection Time: 10/08/20  4:10 PM   Specimen: Nasopharyngeal Swab  Result Value Ref Range Status   SARS Coronavirus 2 NEGATIVE NEGATIVE Final    Comment: (NOTE) SARS-CoV-2 target nucleic acids are NOT DETECTED.  The SARS-CoV-2 RNA is generally detectable in upper and lower respiratory specimens during the acute phase of infection. Negative results do not preclude SARS-CoV-2 infection, do not rule out co-infections with other pathogens, and  should not be used as the sole basis for treatment or other patient management decisions. Negative results must be combined with clinical observations, patient history, and epidemiological information. The expected result is Negative.  Fact Sheet for Patients: SugarRoll.be  Fact Sheet for Healthcare Providers: https://www.woods-mathews.com/  This test is not yet approved or cleared by the [place] FDA and  has been authorized for detection and/or diagnosis of SARS-CoV-2 by FDA under an Emergency Use Authorization (EUA). This EUA will remain  in effect (meaning this test can be used) for the duration of the COVID-19  declaration under Se ction 564(b)(1) of the Act, 21 U.S.C. section 360bbb-3(b)(1), unless the authorization is terminated or revoked sooner.  Performed at [facility], [place] [address], [place], [place] [address]      Time coordinating discharge: Over 30 minutes  SIGNED:   Darliss Cheney, MD  [facility] 10/09/2020, 9:24 AM  If 7PM-7AM, please contact night-coverage [email or web address]

## 2020-10-10 LAB — CULTURE, BLOOD (ROUTINE X 2): Culture: NO GROWTH

## 2020-10-11 LAB — CULTURE, BLOOD (ROUTINE X 2)
Culture: NO GROWTH
Special Requests: ADEQUATE

## 2020-10-13 ENCOUNTER — Emergency Department (HOSPITAL_COMMUNITY): Payer: Medicare Other

## 2020-10-13 ENCOUNTER — Other Ambulatory Visit: Payer: Self-pay

## 2020-10-13 ENCOUNTER — Encounter (HOSPITAL_COMMUNITY): Payer: Self-pay | Admitting: Family Medicine

## 2020-10-13 ENCOUNTER — Inpatient Hospital Stay (HOSPITAL_COMMUNITY)
Admission: EM | Admit: 2020-10-13 | Discharge: 2020-10-15 | DRG: 070 | Disposition: A | Discharge status: Skilled Nursing Facility | Payer: Medicare Other | Attending: Family Medicine | Admitting: Family Medicine

## 2020-10-13 DIAGNOSIS — F32A Depression, unspecified: Secondary | ICD-10-CM | POA: Diagnosis present

## 2020-10-13 DIAGNOSIS — R9431 Abnormal electrocardiogram [ECG] [EKG]: Secondary | ICD-10-CM

## 2020-10-13 DIAGNOSIS — K219 Gastro-esophageal reflux disease without esophagitis: Secondary | ICD-10-CM | POA: Diagnosis present

## 2020-10-13 DIAGNOSIS — Z885 Allergy status to narcotic agent status: Secondary | ICD-10-CM | POA: Diagnosis not present

## 2020-10-13 DIAGNOSIS — E038 Other specified hypothyroidism: Secondary | ICD-10-CM | POA: Diagnosis present

## 2020-10-13 DIAGNOSIS — E8889 Other specified metabolic disorders: Secondary | ICD-10-CM | POA: Diagnosis present

## 2020-10-13 DIAGNOSIS — I4510 Unspecified right bundle-branch block: Secondary | ICD-10-CM | POA: Diagnosis not present

## 2020-10-13 DIAGNOSIS — Z20822 Contact with and (suspected) exposure to covid-19: Secondary | ICD-10-CM | POA: Diagnosis present

## 2020-10-13 DIAGNOSIS — Z833 Family history of diabetes mellitus: Secondary | ICD-10-CM

## 2020-10-13 DIAGNOSIS — M810 Age-related osteoporosis without current pathological fracture: Secondary | ICD-10-CM | POA: Diagnosis present

## 2020-10-13 DIAGNOSIS — Z8673 Personal history of transient ischemic attack (TIA), and cerebral infarction without residual deficits: Secondary | ICD-10-CM

## 2020-10-13 DIAGNOSIS — Z7982 Long term (current) use of aspirin: Secondary | ICD-10-CM

## 2020-10-13 DIAGNOSIS — IMO0002 Reserved for concepts with insufficient information to code with codable children: Secondary | ICD-10-CM | POA: Diagnosis present

## 2020-10-13 DIAGNOSIS — R4182 Altered mental status, unspecified: Secondary | ICD-10-CM | POA: Diagnosis present

## 2020-10-13 DIAGNOSIS — N186 End stage renal disease: Secondary | ICD-10-CM | POA: Diagnosis present

## 2020-10-13 DIAGNOSIS — Z79899 Other long term (current) drug therapy: Secondary | ICD-10-CM

## 2020-10-13 DIAGNOSIS — Z992 Dependence on renal dialysis: Secondary | ICD-10-CM

## 2020-10-13 DIAGNOSIS — D72810 Lymphocytopenia: Secondary | ICD-10-CM | POA: Diagnosis present

## 2020-10-13 DIAGNOSIS — G9340 Encephalopathy, unspecified: Secondary | ICD-10-CM | POA: Diagnosis present

## 2020-10-13 DIAGNOSIS — I5032 Chronic diastolic (congestive) heart failure: Secondary | ICD-10-CM | POA: Diagnosis not present

## 2020-10-13 DIAGNOSIS — E1165 Type 2 diabetes mellitus with hyperglycemia: Secondary | ICD-10-CM

## 2020-10-13 DIAGNOSIS — W050XXA Fall from non-moving wheelchair, initial encounter: Secondary | ICD-10-CM | POA: Diagnosis present

## 2020-10-13 DIAGNOSIS — S0990XA Unspecified injury of head, initial encounter: Secondary | ICD-10-CM

## 2020-10-13 DIAGNOSIS — I10 Essential (primary) hypertension: Secondary | ICD-10-CM

## 2020-10-13 DIAGNOSIS — S00431A Contusion of right ear, initial encounter: Secondary | ICD-10-CM | POA: Diagnosis not present

## 2020-10-13 DIAGNOSIS — E118 Type 2 diabetes mellitus with unspecified complications: Secondary | ICD-10-CM

## 2020-10-13 DIAGNOSIS — S80212A Abrasion, left knee, initial encounter: Secondary | ICD-10-CM | POA: Diagnosis present

## 2020-10-13 DIAGNOSIS — Z23 Encounter for immunization: Secondary | ICD-10-CM

## 2020-10-13 DIAGNOSIS — Y92481 Parking lot as the place of occurrence of the external cause: Secondary | ICD-10-CM | POA: Diagnosis not present

## 2020-10-13 DIAGNOSIS — N2581 Secondary hyperparathyroidism of renal origin: Secondary | ICD-10-CM | POA: Diagnosis not present

## 2020-10-13 DIAGNOSIS — Z66 Do not resuscitate: Secondary | ICD-10-CM | POA: Diagnosis present

## 2020-10-13 DIAGNOSIS — Z823 Family history of stroke: Secondary | ICD-10-CM

## 2020-10-13 DIAGNOSIS — I081 Rheumatic disorders of both mitral and tricuspid valves: Secondary | ICD-10-CM | POA: Diagnosis present

## 2020-10-13 DIAGNOSIS — G9341 Metabolic encephalopathy: Secondary | ICD-10-CM

## 2020-10-13 DIAGNOSIS — I132 Hypertensive heart and chronic kidney disease with heart failure and with stage 5 chronic kidney disease, or end stage renal disease: Secondary | ICD-10-CM | POA: Diagnosis not present

## 2020-10-13 DIAGNOSIS — E1122 Type 2 diabetes mellitus with diabetic chronic kidney disease: Secondary | ICD-10-CM | POA: Diagnosis present

## 2020-10-13 DIAGNOSIS — E785 Hyperlipidemia, unspecified: Secondary | ICD-10-CM | POA: Diagnosis not present

## 2020-10-13 DIAGNOSIS — R296 Repeated falls: Secondary | ICD-10-CM | POA: Diagnosis present

## 2020-10-13 DIAGNOSIS — Z8249 Family history of ischemic heart disease and other diseases of the circulatory system: Secondary | ICD-10-CM

## 2020-10-13 DIAGNOSIS — A09 Infectious gastroenteritis and colitis, unspecified: Secondary | ICD-10-CM | POA: Diagnosis not present

## 2020-10-13 DIAGNOSIS — F17210 Nicotine dependence, cigarettes, uncomplicated: Secondary | ICD-10-CM | POA: Diagnosis present

## 2020-10-13 DIAGNOSIS — S80211A Abrasion, right knee, initial encounter: Secondary | ICD-10-CM | POA: Diagnosis present

## 2020-10-13 DIAGNOSIS — D631 Anemia in chronic kidney disease: Secondary | ICD-10-CM | POA: Diagnosis present

## 2020-10-13 DIAGNOSIS — Z794 Long term (current) use of insulin: Secondary | ICD-10-CM

## 2020-10-13 LAB — URINALYSIS, ROUTINE W REFLEX MICROSCOPIC
Bilirubin Urine: NEGATIVE
Glucose, UA: >=500 mg/dL — AB
Ketones, ur: NEGATIVE mg/dL
Nitrite: NEGATIVE
Protein, ur: 100 mg/dL — AB
Specific Gravity, Urine: 1.006 (ref 1.005–1.030)
pH: 9 — ABNORMAL HIGH (ref 5.0–8.0)

## 2020-10-13 LAB — VITAMIN B12: Vitamin B-12: 846 pg/mL (ref 180–914)

## 2020-10-13 LAB — COMPREHENSIVE METABOLIC PANEL
ALT: 33 U/L (ref 0–44)
AST: 34 U/L (ref 15–41)
Albumin: 2.6 g/dL — ABNORMAL LOW (ref 3.5–5.0)
Alkaline Phosphatase: 86 U/L (ref 38–126)
Anion gap: 5 (ref 5–15)
BUN: <5 mg/dL — ABNORMAL LOW (ref 6–20)
CO2: 32 mmol/L (ref 22–32)
Calcium: 8.3 mg/dL — ABNORMAL LOW (ref 8.9–10.3)
Chloride: 100 mmol/L (ref 98–111)
Creatinine, Ser: 1.19 mg/dL — ABNORMAL HIGH (ref 0.44–1.00)
GFR, Estimated: 52 mL/min — ABNORMAL LOW (ref >60)
Glucose, Bld: 101 mg/dL — ABNORMAL HIGH (ref 70–99)
Potassium: 4 mmol/L (ref 3.5–5.1)
Sodium: 137 mmol/L (ref 135–145)
Total Bilirubin: 0.9 mg/dL (ref 0.3–1.2)
Total Protein: 5.8 g/dL — ABNORMAL LOW (ref 6.5–8.1)

## 2020-10-13 LAB — CBC
HCT: 37.1 % (ref 36.0–46.0)
Hemoglobin: 11.4 g/dL — ABNORMAL LOW (ref 12.0–15.0)
MCH: 29.7 pg (ref 26.0–34.0)
MCHC: 30.7 g/dL (ref 30.0–36.0)
MCV: 96.6 fL (ref 80.0–100.0)
Platelets: 150 10*3/uL (ref 150–400)
RBC: 3.84 MIL/uL — ABNORMAL LOW (ref 3.87–5.11)
RDW: 17.9 % — ABNORMAL HIGH (ref 11.5–15.5)
WBC: 3.7 10*3/uL — ABNORMAL LOW (ref 4.0–10.5)
nRBC: 0 % (ref 0.0–0.2)

## 2020-10-13 LAB — MAGNESIUM: Magnesium: 1.8 mg/dL (ref 1.7–2.4)

## 2020-10-13 LAB — AMMONIA: Ammonia: 21 umol/L (ref 9–35)

## 2020-10-13 LAB — TSH: TSH: 10.916 u[IU]/mL — ABNORMAL HIGH (ref 0.350–4.500)

## 2020-10-13 LAB — CBG MONITORING, ED
Glucose-Capillary: 104 mg/dL — ABNORMAL HIGH (ref 70–99)
Glucose-Capillary: 145 mg/dL — ABNORMAL HIGH (ref 70–99)

## 2020-10-13 MED ORDER — TETANUS-DIPHTH-ACELL PERTUSSIS 5-2.5-18.5 LF-MCG/0.5 IM SUSY
0.5000 mL | PREFILLED_SYRINGE | Freq: Once | INTRAMUSCULAR | Status: AC
  Administered 2020-10-13: 0.5 mL via INTRAMUSCULAR
  Filled 2020-10-13: qty 0.5

## 2020-10-13 MED ORDER — MIDODRINE HCL 5 MG PO TABS: 10.0000 mg | ORAL_TABLET | ORAL | Status: DC

## 2020-10-13 MED ORDER — HEPARIN SODIUM (PORCINE) 5000 UNIT/ML IJ SOLN
5000.0000 [IU] | Freq: Three times a day (TID) | INTRAMUSCULAR | Status: DC
  Administered 2020-10-14 – 2020-10-15 (×5): 5000 [IU] via SUBCUTANEOUS
  Filled 2020-10-13 (×5): qty 1

## 2020-10-13 MED ORDER — INSULIN ASPART 100 UNIT/ML ~~LOC~~ SOLN
0.0000 [IU] | Freq: Three times a day (TID) | SUBCUTANEOUS | Status: DC
  Administered 2020-10-14: 1 [IU] via SUBCUTANEOUS

## 2020-10-13 MED ORDER — SENNOSIDES-DOCUSATE SODIUM 8.6-50 MG PO TABS: 1.0000 | ORAL_TABLET | Freq: Every evening | ORAL | Status: DC | PRN

## 2020-10-13 MED ORDER — PROSOURCE PLUS PO LIQD
30.0000 mL | Freq: Every day | ORAL | Status: DC
  Administered 2020-10-14 – 2020-10-15 (×2): 30 mL via ORAL
  Filled 2020-10-13 (×3): qty 30

## 2020-10-13 MED ORDER — ACETAMINOPHEN 650 MG RE SUPP: 650.0000 mg | Freq: Four times a day (QID) | RECTAL | Status: DC | PRN

## 2020-10-13 MED ORDER — CARVEDILOL 6.25 MG PO TABS
6.2500 mg | ORAL_TABLET | Freq: Two times a day (BID) | ORAL | Status: DC
  Administered 2020-10-14 – 2020-10-15 (×4): 6.25 mg via ORAL
  Filled 2020-10-13 (×4): qty 1

## 2020-10-13 MED ORDER — PANTOPRAZOLE SODIUM 40 MG PO TBEC
40.0000 mg | DELAYED_RELEASE_TABLET | Freq: Every day | ORAL | Status: DC
  Administered 2020-10-14 – 2020-10-15 (×2): 40 mg via ORAL
  Filled 2020-10-13 (×2): qty 1

## 2020-10-13 MED ORDER — SEVELAMER CARBONATE 800 MG PO TABS
800.0000 mg | ORAL_TABLET | Freq: Three times a day (TID) | ORAL | Status: DC
  Administered 2020-10-14 – 2020-10-15 (×5): 800 mg via ORAL
  Filled 2020-10-13 (×5): qty 1

## 2020-10-13 MED ORDER — ATORVASTATIN CALCIUM 40 MG PO TABS
40.0000 mg | ORAL_TABLET | Freq: Every day | ORAL | Status: DC
  Administered 2020-10-14 – 2020-10-15 (×2): 40 mg via ORAL
  Filled 2020-10-13 (×2): qty 1

## 2020-10-13 MED ORDER — ACETAMINOPHEN 325 MG PO TABS: 650.0000 mg | ORAL_TABLET | Freq: Four times a day (QID) | ORAL | Status: DC | PRN

## 2020-10-13 MED ORDER — FAMOTIDINE 20 MG PO TABS
10.0000 mg | ORAL_TABLET | Freq: Every day | ORAL | Status: DC
  Administered 2020-10-14 – 2020-10-15 (×2): 10 mg via ORAL
  Filled 2020-10-13 (×2): qty 1

## 2020-10-13 MED ORDER — AMLODIPINE BESYLATE 5 MG PO TABS
5.0000 mg | ORAL_TABLET | Freq: Every day | ORAL | Status: DC
  Administered 2020-10-14 – 2020-10-15 (×2): 5 mg via ORAL
  Filled 2020-10-13 (×2): qty 1

## 2020-10-13 MED ORDER — ASPIRIN 81 MG PO CHEW
81.0000 mg | CHEWABLE_TABLET | Freq: Every day | ORAL | Status: DC
  Administered 2020-10-14 – 2020-10-15 (×2): 81 mg via ORAL
  Filled 2020-10-13 (×2): qty 1

## 2020-10-13 NOTE — H&P (Signed)
History and Physical    [name] ELIZARRARAS [id number] DOB: 1961/03/14 DOA: 10/13/2020  PCP: Charlott Rakes, MD   Patient coming from: SNF  Chief Complaint: Fall, confusion   HPI: [name] Michael is a 60 y.o. female with medical history significant for ESRD on hemodialysis, history of CVA, hypertension, type 2 diabetes mellitus, and chronic diastolic CHF, now presenting to the emergency department with confusion and a fall.  Patient was admitted to the hospital recently with altered mental status and discharged to an SNF in her usual state on 10/09/2020, reportedly completed dialysis today without incident, but was then found outside of the dialysis center on the ground after an apparent fall from her wheelchair.  Patient is unable to describe how she fell, reports some right ear pain, but has no other complaints.  Her sister notes that the patient has had increasingly frequent falls over the past year including recently falling out of bed at her nursing facility, and several falls from her wheelchair.  Patient is oriented to self only in the ED, has had frequent spells of disorientation in the past year per family report, but was fully oriented when she left the hospital 4 days ago.  ED Course: Upon arrival to the ED, patient is found to be afebrile, saturating well on room air, and with stable blood pressure.  EKG features sinus rhythm with RBBB, LPF B, and QTc interval 539 ms.  Head CT is negative for acute intracranial abnormality.  No acute fracture or subluxation noted on cervical spine CT.  CT of temporal bones is normal.  Chest x-ray negative for acute findings.  Radiographs of the right hand, bilateral knees, and pelvis are also negative for acute fractures.  ED physician discussed the right ear injury with ENT who recommended ice and elevation of the head of bed.  Tdap was updated.  COVID-19 screening test, ammonia, and TSH were ordered from the ED and hospitalist consulted for  admission.  Review of Systems:  All other systems reviewed and apart from HPI, are negative.  Past Medical History:  Diagnosis Date  . Ambulates with cane   . Constipation   . Depression   . ESRD (end stage renal disease) ([place])    a. TTS Dialysis.  Marland Kitchen GERD (gastroesophageal reflux disease)   . History of stress test    a. 01/2003 MV: EF 74%, no ischemia/infarct.  . Hyperlipidemia   . Hypertension   . Osteoporosis   . Stroke [facility]) 04-01-11   left frontal subcortical, saw Dr. [name]   . Syncope 11/2019  . TIA (transient ischemic attack) 03-12-11  . Tobacco abuse   . Tricuspid regurgitation    a. 05/2016 Echo: EF 65-70%, Gr2DD, mild MR, nl RV fxn, Triv TR, PASP 52mHg; b. 05/2018 Echo: EF 60-65%, Gr2DD, mild MR/TR, RVSP/PASP 312mg; c. 11/2019 Echo: EF 55-60%, no rwma, mild-mod MR, Sev TR w/ RV dilatation (CTA chest neg for PE).  . Type II diabetes mellitus ([facility]  . Vitamin D deficiency     Past Surgical History:  Procedure Laterality Date  . AV FISTULA PLACEMENT Left 02/21/2019   Procedure: BRACHIOCEPHALIC ARTERIOVENOUS (AV) FISTULA CREATION;  Surgeon: ClMarty HeckMD;  Location: [facility] Service: Vascular;  Laterality: Left;  . BASCILIC VEIN TRANSPOSITION Left 04/11/2019   Procedure: SECOND STAGE BASILIC VEIN TRANSPOSITION LEFT ARM;  Surgeon: ClMarty HeckMD;  [facility] Service: Vascular;  Laterality: Left;  . IR FLUORO GUIDE CV LINE RIGHT  04/29/2019  .  IR US GUIDE VASC ACCESS RIGHT  04/29/2019  . OPEN REDUCTION INTERNAL FIXATION (ORIF) DISTAL RADIAL FRACTURE Left 01/28/2016   Procedure: OPEN REDUCTION INTERNAL FIXATION (ORIF) DISTAL RADIAL FRACTURE;  Surgeon: Iran Planas, MD;  Location: [place];  Service: Orthopedics;  Laterality: Left;  . [place] CATH N/A 12/05/2019   Procedure: RIGHT HEART CATH;  Surgeon: Nelva Bush, MD;  Location: [place] [facility];  Service: Cardiovascular;  Laterality: N/A;  .  TONSILLECTOMY AND ADENOIDECTOMY     age 30    Social History:   reports that she has been smoking cigarettes. She has a 9.60 pack-year smoking history. She has never used smokeless tobacco. She reports that she does not drink alcohol and does not use drugs.  Allergies  Allergen Reactions  . Hydrocodone Nausea And Vomiting and Other (See Comments)    "Allergic," per [facility]    Family History  Problem Relation Age of Onset  . Stroke Mother   . Diabetes Mother   . Kidney failure Mother   . Heart failure Mother   . Stroke Father   . Cancer Sister        Breast- 64's     Prior to Admission medications   Medication Sig Start Date End Date Taking? Authorizing Provider  Amino Acids-Protein Hydrolys (FEEDING SUPPLEMENT, PRO-STAT SUGAR FREE 64,) LIQD Take 30 mLs by mouth in the morning. WILD CHERRY FLAVOR    [provider]  amLODipine (NORVASC) 5 MG tablet Take 1 tablet (5 mg total) by mouth daily for 7 days. 10/09/20 10/16/20  Darliss Cheney, MD  aspirin 81 MG chewable tablet Chew 81 mg by mouth daily.    [provider]  atorvastatin (LIPITOR) 40 MG tablet Take 40 mg by mouth at bedtime.     [provider]  carvedilol (COREG) 6.25 MG tablet Take 1 tablet (6.25 mg total) by mouth 2 (two) times daily with a meal. 10/09/20 11/08/20  Darliss Cheney, MD  ciprofloxacin (CIPRO) 250 MG tablet Take 1 tablet (250 mg total) by mouth daily with breakfast for 7 days. 10/09/20 10/16/20  Darliss Cheney, MD  cloNIDine (CATAPRES) 0.1 MG tablet Take 0.1 mg by mouth every 8 (eight) hours as needed (BP>160/90).    [provider]  famotidine (PEPCID) 10 MG tablet Take 10 mg by mouth in the morning.    [provider]  FLUoxetine (PROZAC) 20 MG tablet Take 20 mg by mouth daily.    [provider]  HUMALOG 100 UNIT/ML injection Inject 0.05 mLs (5 Units total) into the skin 3 (three) times daily with meals. Patient not taking: No sig reported 08/23/20   Madalyn Rob, MD   metroNIDAZOLE (FLAGYL) 500 MG tablet Take 1 tablet (500 mg total) by mouth 3 (three) times daily for 7 days. 10/09/20 10/16/20  Darliss Cheney, MD  midodrine (PROAMATINE) 10 MG tablet Take 10 mg by mouth as directed. Take 1 tablet (10 mg) prior to Dialysis    [provider]  Multiple Vitamins-Minerals (CERTAVITE SENIOR) TABS Take 1 tablet by mouth daily.    [provider]  omega-3 acid ethyl esters (LOVAZA) 1 g capsule Take 2 capsules by mouth 2 (two) times daily. 05/28/20   [provider]  omeprazole (PRILOSEC) 40 MG capsule Take 40 mg by mouth daily before breakfast.     [provider]  polyethylene glycol powder (GLYCOLAX/MIRALAX) 17 GM/SCOOP powder Take 17 g by mouth See admin  instructions. Mix 17 grams of powder into 8 ounces of fluid, stir, and drink (by mouth) once a day    [provider]  SACCHAROMYCES BOULARDII PO Take 1 capsule by mouth 2 (two) times daily.    [provider]  senna-docusate (SENOKOT-S) 8.6-50 MG tablet Take 1 tablet by mouth at bedtime as needed for mild constipation. 06/11/18   Regalado, [name], MD  sevelamer carbonate (RENVELA) 800 MG tablet Take 800 mg by mouth 3 (three) times daily with meals.    [provider]  sodium bicarbonate 650 MG tablet Take 1,300 mg by mouth 2 (two) times daily.    [provider]  Vitamin D, Ergocalciferol, (DRISDOL) 50000 units CAPS capsule Take 1 capsule (50,000 Units total) by mouth every 7 (seven) days. 11/21/16   Jonetta Osgood, MD    Physical Exam: Vitals:   10/13/20 1930 10/13/20 1945 10/13/20 2000 10/13/20 2030  BP: (!) 162/78 (!) 163/80 (!) 163/78 (!) 165/76  Pulse: 94 94 93 94  Resp: '14 15 14 18  '$ Temp:      TempSrc:      SpO2: 96% 97% 92% 97%  Weight:      Height:         Constitutional: NAD, calm  Eyes: PERTLA, lids and conjunctivae normal ENMT: Mucous membranes are moist. Right ear abrasion and swelling.   Neck:  supple, no masses   Respiratory:  no wheezing, no crackles. No accessory muscle use.  Cardiovascular: S1 & S2 heard, regular rate and rhythm. No extremity edema.   Abdomen: No distension, no tenderness, soft. Bowel sounds active.  Musculoskeletal: no clubbing / cyanosis. No joint deformity upper and lower extremities.   Skin: Abrasions to knees, right hand, and right ear. Warm, dry, well-perfused. Neurologic: CN 2-12 grossly intact. Sensation intact. Moving all extrremities.  Psychiatric: Alert and oriented to person only. Pleasant and cooperative.    Labs and Imaging on Admission: I have personally reviewed following labs and imaging studies  CBC: Recent Labs  Lab 10/07/20 0414 10/08/20 0407 10/09/20 0242 10/13/20 1802  WBC 4.1 4.7 4.5 3.7*  NEUTROABS 2.7 3.2  --   --   HGB 10.4* 10.6* 10.2* 11.4*  HCT 33.5* 35.2* 33.8* 37.1  MCV 96.3 100.3* 98.3 96.6  PLT 60* 53* 53* [id number]   Basic Metabolic Panel: Recent Labs  Lab 10/07/20 0414 10/08/20 0407 10/09/20 0242 10/13/20 1802 10/13/20 2053  NA 136 134* 134* 137  --   K 2.7* 3.6 3.3* 4.0  --   CL 99 103 101 100  --   CO2 '29 23 24 '$ 32  --   GLUCOSE 120* 129* 123* 101*  --   BUN <5* 10 15 <5*  --   CREATININE 1.82* 3.43* 4.33* 1.19*  --   CALCIUM 8.4* 8.6* 9.1 8.3*  --   MG 1.8  --   --   --  1.8   GFR: Estimated Creatinine Clearance: 27.2 mL/min (A) (by C-G formula based on SCr of 1.19 mg/dL (H)). Liver Function Tests: Recent Labs  Lab 10/07/20 0414 10/08/20 0407 10/09/20 0242 10/13/20 1802  AST 93* 66* 44* 34  ALT 72* 64* 57* 33  ALKPHOS 77 71 71 86  BILITOT 1.9* 1.6* 1.4* 0.9  PROT 5.0* 5.1* 4.8* 5.8*  ALBUMIN 2.2* 2.2* 2.1* 2.6*   No results for input(s): LIPASE, AMYLASE in the last 168 hours. No results for input(s): AMMONIA in the last 168 hours. Coagulation Profile: No results for input(s): INR,  PROTIME in the last 168 hours. Cardiac Enzymes: No results for input(s): CKTOTAL, CKMB, CKMBINDEX, TROPONINI in the last 168  hours. BNP (last 3 results) No results for input(s): PROBNP in the last 8760 hours. HbA1C: No results for input(s): HGBA1C in the last 72 hours. CBG: Recent Labs  Lab 10/08/20 1226 10/08/20 1558 10/08/20 1808 10/09/20 1318 10/13/20 1745  GLUCAP 265* 183* 101* 94 104*   Lipid Profile: No results for input(s): CHOL, HDL, LDLCALC, TRIG, CHOLHDL, LDLDIRECT in the last 72 hours. Thyroid Function Tests: No results for input(s): TSH, T4TOTAL, FREET4, T3FREE, THYROIDAB in the last 72 hours. Anemia Panel: No results for input(s): VITAMINB12, FOLATE, FERRITIN, TIBC, IRON, RETICCTPCT in the last 72 hours. Urine analysis:    Component Value Date/Time   COLORURINE STRAW (A) 08/21/2020 1154   APPEARANCEUR CLEAR 08/21/2020 1154   LABSPEC 1.002 (L) 08/21/2020 1154   PHURINE 8.0 08/21/2020 1154   GLUCOSEU >=500 (A) 08/21/2020 1154   HGBUR NEGATIVE 08/21/2020 1154   BILIRUBINUR NEGATIVE 08/21/2020 1154   BILIRUBINUR n 05/25/2012 1202   KETONESUR NEGATIVE 08/21/2020 1154   PROTEINUR 100 (A) 08/21/2020 1154   UROBILINOGEN 0.2 05/25/2012 1202   UROBILINOGEN 0.2 03/29/2011 0131   NITRITE NEGATIVE 08/21/2020 1154   LEUKOCYTESUR NEGATIVE 08/21/2020 1154   Sepsis Labs: '@LABRCNTIP'$ (procalcitonin:4,lacticidven:4) ) Recent Results (from the past 240 hour(s))  Resp Panel by RT-PCR (Flu A&B, Covid) Nasopharyngeal Swab     Status: None   Collection Time: 10/05/20  1:11 PM   Specimen: Nasopharyngeal Swab; Nasopharyngeal(NP) swabs in vial transport medium  Result Value Ref Range Status   SARS Coronavirus 2 by RT PCR NEGATIVE NEGATIVE Final    Comment: (NOTE) SARS-CoV-2 target nucleic acids are NOT DETECTED.  The SARS-CoV-2 RNA is generally detectable in upper respiratory specimens during the acute phase of infection. The lowest concentration of SARS-CoV-2 viral copies this assay can detect is 138 copies/mL. A negative result does not preclude SARS-Cov-2 infection and should not be used as the  sole basis for treatment or other patient management decisions. A negative result may occur with  improper specimen collection/handling, submission of specimen other than nasopharyngeal swab, presence of viral mutation(s) within the areas targeted by this assay, and inadequate number of viral copies(<138 copies/mL). A negative result must be combined with clinical observations, patient history, and epidemiological information. The expected result is Negative.  Fact Sheet for Patients:  EntrepreneurPulse.com.au  Fact Sheet for Healthcare Providers:  IncredibleEmployment.be  This test is no t yet approved or cleared by the [place] FDA and  has been authorized for detection and/or diagnosis of SARS-CoV-2 by FDA under an Emergency Use Authorization (EUA). This EUA will remain  in effect (meaning this test can be used) for the duration of the COVID-19 declaration under Section 564(b)(1) of the Act, 21 U.S.C.section 360bbb-3(b)(1), unless the authorization is terminated  or revoked sooner.       Influenza A by PCR NEGATIVE NEGATIVE Final   Influenza B by PCR NEGATIVE NEGATIVE Final    Comment: (NOTE) The Xpert Xpress SARS-CoV-2/FLU/RSV plus assay is intended as an aid in the diagnosis of influenza from Nasopharyngeal swab specimens and should not be used as a sole basis for treatment. Nasal washings and aspirates are unacceptable for Xpert Xpress SARS-CoV-2/FLU/RSV testing.  Fact Sheet for Patients: EntrepreneurPulse.com.au  Fact Sheet for Healthcare Providers: IncredibleEmployment.be  This test is not yet approved or cleared by the [place] FDA and has been authorized for detection and/or diagnosis of SARS-CoV-2 by FDA  under an Emergency Use Authorization (EUA). This EUA will remain in effect (meaning this test can be used) for the duration of the COVID-19 declaration under Section 564(b)(1) of the  Act, 21 U.S.C. section 360bbb-3(b)(1), unless the authorization is terminated or revoked.  Performed at [facility], [place] [address] [place], [place] [address]   Blood Culture (routine x 2)     Status: None   Collection Time: 10/05/20  3:40 PM   Specimen: BLOOD RIGHT FOREARM  Result Value Ref Range Status   Specimen Description BLOOD RIGHT FOREARM  Final   Special Requests   Final    BOTTLES DRAWN AEROBIC AND ANAEROBIC Blood Culture results may not be optimal due to an inadequate volume of blood received in culture bottles   Culture   Final    NO GROWTH 5 DAYS Performed at [facility], [place] [address], [place], [place] [address]    Report Status 10/10/2020 FINAL  Final  Blood Culture (routine x 2)     Status: None   Collection Time: 10/05/20 10:10 PM   Specimen: BLOOD RIGHT ARM  Result Value Ref Range Status   Specimen Description BLOOD RIGHT ARM  Final   Special Requests   Final    BOTTLES DRAWN AEROBIC AND ANAEROBIC Blood Culture adequate volume   Culture   Final    NO GROWTH 5 DAYS Performed at [facility], [place] [address], [place], [place] [address]    Report Status 10/11/2020 FINAL  Final  C Difficile Quick Screen w PCR reflex     Status: None   Collection Time: 10/07/20  5:00 AM   Specimen: STOOL  Result Value Ref Range Status   C Diff antigen NEGATIVE NEGATIVE Final   C Diff toxin NEGATIVE NEGATIVE Final   C Diff interpretation No C. difficile detected.  Final    Comment: Performed at [facility], [place] [address], [place], [place] [address]  Gastrointestinal Panel by PCR , Stool     Status: None   Collection Time: 10/07/20  5:00 AM   Specimen: Stool  Result Value Ref Range Status   Campylobacter species NOT DETECTED NOT DETECTED Final   Plesimonas shigelloides NOT DETECTED NOT DETECTED Final   Salmonella species NOT DETECTED NOT DETECTED Final   Yersinia enterocolitica NOT DETECTED NOT DETECTED Final   Vibrio species NOT DETECTED NOT  DETECTED Final   Vibrio cholerae NOT DETECTED NOT DETECTED Final   Enteroaggregative E coli (EAEC) NOT DETECTED NOT DETECTED Final   Enteropathogenic E coli (EPEC) NOT DETECTED NOT DETECTED Final   Enterotoxigenic E coli (ETEC) NOT DETECTED NOT DETECTED Final   Shiga like toxin producing E coli (STEC) NOT DETECTED NOT DETECTED Final   Shigella/Enteroinvasive E coli (EIEC) NOT DETECTED NOT DETECTED Final   Cryptosporidium NOT DETECTED NOT DETECTED Final   Cyclospora cayetanensis NOT DETECTED NOT DETECTED Final   Entamoeba histolytica NOT DETECTED NOT DETECTED Final   Giardia lamblia NOT DETECTED NOT DETECTED Final   Adenovirus F40/41 NOT DETECTED NOT DETECTED Final   Astrovirus NOT DETECTED NOT DETECTED Final   Norovirus GI/GII NOT DETECTED NOT DETECTED Final   Rotavirus A NOT DETECTED NOT DETECTED Final   Sapovirus (I, II, IV, and V) NOT DETECTED NOT DETECTED Final    Comment: Performed at [facility], [place] [place], [place] [address]  SARS CORONAVIRUS 2 (TAT 6-24 HRS) Nasopharyngeal Nasopharyngeal Swab     Status: None   Collection Time:  10/08/20  4:10 PM   Specimen: Nasopharyngeal Swab  Result Value Ref Range Status   SARS Coronavirus 2 NEGATIVE NEGATIVE Final    Comment: (NOTE) SARS-CoV-2 target nucleic acids are NOT DETECTED.  The SARS-CoV-2 RNA is generally detectable in upper and lower respiratory specimens during the acute phase of infection. Negative results do not preclude SARS-CoV-2 infection, do not rule out co-infections with other pathogens, and should not be used as the sole basis for treatment or other patient management decisions. Negative results must be combined with clinical observations, patient history, and epidemiological information. The expected result is Negative.  Fact Sheet for Patients: SugarRoll.be  Fact Sheet for Healthcare Providers: https://www.woods-mathews.com/  This test is not yet  approved or cleared by the [place] FDA and  has been authorized for detection and/or diagnosis of SARS-CoV-2 by FDA under an Emergency Use Authorization (EUA). This EUA will remain  in effect (meaning this test can be used) for the duration of the COVID-19 declaration under Se ction 564(b)(1) of the Act, 21 U.S.C. section 360bbb-3(b)(1), unless the authorization is terminated or revoked sooner.  Performed at [facility], [place] [address], [place], [place] [address]      Radiological Exams on Admission: CT Head Wo Contrast  Result Date: 10/13/2020 CLINICAL DATA:  Head trauma, mod-severe EXAM: CT HEAD WITHOUT CONTRAST TECHNIQUE: Contiguous axial images were obtained from the base of the skull through the vertex without intravenous contrast. COMPARISON:  Head CT 8 days ago 10/05/2020 FINDINGS: Brain: No intracranial hemorrhage, mass effect, or midline shift. No hydrocephalus. The basilar cisterns are patent. Stable degree of atrophy and chronic small vessel ischemia. Again seen multiple remote lacunar infarct in the bilateral basal ganglia and thalami. Slight ex vacuo dilatation of the left lateral ventricle, stable. No evidence of territorial infarct or acute ischemia. No extra-axial or intracranial fluid collection. Vascular: Atherosclerosis of skullbase vasculature without hyperdense vessel or abnormal calcification. Skull: Temporal bone CT obtained to assess the temporal bones, reported separately. No evidence of skull fracture. Sinuses/Orbits: No acute findings. Other: Right postauricular soft tissue edema, assessed on concurrent temporal bone CT, reported separately. IMPRESSION: 1. No acute intracranial abnormality. No skull fracture. 2. Stable atrophy, chronic small vessel ischemia, and remote lacunar infarcts. 3. Right postauricular soft tissue edema, assessed on concurrent temporal bone CT, reported separately. Electronically Signed   By: Keith Rake M.D.   On: 10/13/2020 18:54    CT Cervical Spine Wo Contrast  Result Date: 10/13/2020 CLINICAL DATA:  Polytrauma, critical, head/C-spine injury suspected EXAM: CT CERVICAL SPINE WITHOUT CONTRAST TECHNIQUE: Multidetector CT imaging of the cervical spine was performed without intravenous contrast. Multiplanar CT image reconstructions were also generated. COMPARISON:  Cervical radiograph 02/29/2020 FINDINGS: Mild motion artifact limits assessment. Alignment: Straightening of normal lordosis. No traumatic subluxation. Skull base and vertebrae: No acute fracture. Vertebral body heights are maintained. The dens and skull base are intact. Soft tissues and spinal canal: No prevertebral fluid or swelling. No visible canal hematoma. Disc levels: Mild degenerative disc disease most prominent at C4-C5. Occasional facet hypertrophy. Upper chest: No acute or unexpected findings. Patulous upper esophagus. Other: None. IMPRESSION: Mild degenerative change in the cervical spine without acute fracture or subluxation. Electronically Signed   By: Keith Rake M.D.   On: 10/13/2020 18:58   DG Pelvis Portable  Result Date: 10/13/2020 CLINICAL DATA:  Knee contusions after a fall. EXAM: PORTABLE PELVIS 1-2 VIEWS COMPARISON:  None. FINDINGS: There is no evidence of pelvic fracture or diastasis. No  pelvic bone lesions are seen. Mild degenerative changes in the lower lumbar spine and hips. IMPRESSION: No acute bony abnormalities. Electronically Signed   By: Lucienne Capers M.D.   On: 10/13/2020 19:04   DG Chest Port 1 View  Result Date: 10/13/2020 CLINICAL DATA:  Fall. EXAM: PORTABLE CHEST 1 VIEW COMPARISON:  10/05/2020. FINDINGS: Stable heart size and mediastinal contours. Aortic atherosclerosis. No pneumothorax, large pleural effusion or focal airspace disease. No acute osseous abnormalities are seen. IMPRESSION: No acute findings. Electronically Signed   By: Keith Rake M.D.   On: 10/13/2020 19:04   DG Knee Complete 4 Views Left  Result Date:  10/13/2020 CLINICAL DATA:  Knee contusions after a fall. EXAM: LEFT KNEE - COMPLETE 4+ VIEW COMPARISON:  None. FINDINGS: No evidence of fracture, dislocation, or joint effusion. No evidence of arthropathy or other focal bone abnormality. Soft tissues are unremarkable. IMPRESSION: Negative. Electronically Signed   By: Lucienne Capers M.D.   On: 10/13/2020 19:04   DG Knee Complete 4 Views Right  Result Date: 10/13/2020 CLINICAL DATA:  Knee contusions after a fall. EXAM: RIGHT KNEE - COMPLETE 4+ VIEW COMPARISON:  None. FINDINGS: Mild degenerative changes with mild medial compartment narrowing. No evidence of acute fracture or dislocation. No focal bone lesion or bone destruction. No effusions. Soft tissues are unremarkable. IMPRESSION: Mild degenerative changes. No acute bony abnormalities. Electronically Signed   By: Lucienne Capers M.D.   On: 10/13/2020 19:05   DG Hand Complete Right  Result Date: 10/13/2020 CLINICAL DATA:  Contusions after a fall. EXAM: RIGHT HAND - COMPLETE 3+ VIEW COMPARISON:  Right wrist 12/03/2019. FINDINGS: Diffuse bone demineralization. Deformity of the distal radial metaphysis without cortical changes likely represents old fracture deformity. There is an ununited ossicle at the ulnar styloid process likely representing an old injury as well. These correspond to acute injuries seen on prior wrist radiographs from 12/03/2019. degenerative changes in the interphalangeal joints. Mild dorsal soft tissue swelling. No acute fracture or dislocation identified. IMPRESSION: Old fracture deformities of the distal radial metaphysis and ulnar styloid process. No acute fractures identified. Electronically Signed   By: Lucienne Capers M.D.   On: 10/13/2020 19:07   CT Temporal Bones Wo Contrast  Result Date: 10/13/2020 CLINICAL DATA:  Right postauricular swelling. EXAM: CT TEMPORAL BONES WITHOUT CONTRAST TECHNIQUE: Axial and coronal plane CT imaging of the petrous temporal bones was performed  with thin-collimation image reconstruction. No intravenous contrast was administered. Multiplanar CT image reconstructions were also generated. COMPARISON:  None. FINDINGS: RIGHT: --Pinna and external auditory canal: Normal. --Ossicular chain: Normal. No erosion or dislocation. --Tympanic membrane: Normal. --Middle ear: Normal. --Epitympanum: The Prussak space is clear. The scutum is sharp. Tegmen tympani is intact. --Cochlea, vestibule, vestibular aqueduct and semicircular canals: Normal. No evidence of canal dehiscence or otospongiosis. --Internal auditory canal: Normal. No widening of the porus acusticus. --Facial nerve: No focal abnormality along the course of the facial nerve. --Cerebellopontine angle: Normal. --Petrous Apex: Normal. --Mastoids: Normal. --Carotid canal: Normal position. LEFT: --Pinna and external auditory canal: Normal. --Ossicular chain: Normal. No erosion or dislocation. --Tympanic membrane: Normal. --Middle ear: Normal. --Epitympanum: The Prussak space is clear. The scutum is sharp. Tegmen tympani is intact. --Cochlea, vestibule, vestibular aqueduct and semicircular canals: Normal. No evidence of canal dehiscence or otospongiosis. --Internal auditory canal: Normal. No widening of the porus acusticus. --Facial nerve: No focal abnormality along the course of the facial nerve. --Cerebellopontine angle: Normal. --Petrous Apex: Normal. --Mastoids: Normal. --Carotid canal: Normal position. OTHER: --Visualized intracranial:  Normal. --Visualized paranasal sinuses: Normal. --Nasopharynx: Clear. --Temporomandibular joints: Normal. --Visualized extracranial soft tissues: Normal. IMPRESSION: Normal CT of the temporal bones. Electronically Signed   By: Ulyses Jarred M.D.   On: 10/13/2020 19:25    EKG: Independently reviewed. Sinus rhythm, RBBB, LPFB, QTc 539.   Assessment/Plan   1. Acute encephalopathy  - Presents after a fall and noted to be confused - Per chart review and discussion with pt's  sister, there have been recurrent spells of confusion and weakness with frequent falls for at least one year  - Head CT negative for acute findings, no acute metabolic derangements, no obvious infection, oxycodone recently stopped  - Possibly d/t mild TBI/concussion  - TSH recently elevated with normal T4  - TSH and ammonia ordered from ED and pending, will add B12, RPR, and UA, stop ciprofloxacin and Flagyl, continue supportive care and monitoring    2. ESRD  - Completed HD 10/13/20  - Continue renal diet, fluid-restrictions, consult nephro for maintenance HD if she remains in hospital on 10/16/20    3. Ear hematoma  - No underlying fractures, ED physician discussed with ENT who recommended ice and elevating head of bed     4. Prolonged QT interval  - QTc 539 ms in ED  - Minimize QT-prolonging medications, check magnesium level, repeat EKG in am     5. Hypertension  - Continue Norvasc, Coreg    6. Type II DM  - A1c was 6.8% in March 2022  - Check CBGs, use SSI   7. Colitis  - Plan to stop ciprofloxacin and Flagyl now in light of unexplained confusion and as she has already received 9 days of antibiotics for possible infectious colitis and symptoms had resolved    DVT prophylaxis: sq heparin Code Status: DNR Level of Care: Level of care: Telemetry Medical Family Communication: Sister updated by phone  Disposition Plan:  Patient is from: SNF  Anticipated d/c is to: SNF  Anticipated d/c date is: 10/14/20 Patient currently: Pending additional laboratory investigation improvement in mental status  Consults called: none  Admission status: Observation     Vianne Bulls, MD [facility]  10/13/2020, 9:59 PM

## 2020-10-13 NOTE — ED Triage Notes (Signed)
Pt via EMS from dialysis center where she was found in the parking lot after completing her whole tx. Unwitnessed fall but appears she might have fallen out of her wheelchair off the curb. Blood in R ear, abrasions to both knees and both ears. Pt mildly confused and wheelchair bound at baseline.

## 2020-10-13 NOTE — ED Notes (Signed)
Attempted report x1. 

## 2020-10-13 NOTE — ED Provider Notes (Signed)
[place] EMERGENCY DEPARTMENT Provider Note   CSN: [id number] Arrival date & time: 10/13/20  1741     History No chief complaint on file.   [name] Michael is a 60 y.o. female.  60yo F w/ PMH including ESRD on HD, HTN, HLD, stroke, T2DM who p/w fall.  Dialysis center states that patient had her usual dialysis session today and sometime afterwards, she was found on the ground in the parking lot after an apparent fall.  She was noted to have some bleeding from her right ear and abrasions on her knees and EMS was called.  She has been altered for EMS.  Dialysis states that she has some slight confusion at baseline but this seems worse than usual.  Unknown whether she is on anticoagulation.  Patient endorses pain in her knees and her right ear.  LEVEL 5 CAVEAT DUE TO AMS  The history is provided by the EMS personnel.       Past Medical History:  Diagnosis Date  . Ambulates with cane   . Constipation   . Depression   . ESRD (end stage renal disease) ([place])    a. TTS Dialysis.  Marland Kitchen GERD (gastroesophageal reflux disease)   . History of stress test    a. 01/2003 MV: EF 74%, no ischemia/infarct.  . Hyperlipidemia   . Hypertension   . Osteoporosis   . Stroke [facility]) 04-01-11   left frontal subcortical, saw Dr. [name]   . Syncope 11/2019  . TIA (transient ischemic attack) 03-12-11  . Tobacco abuse   . Tricuspid regurgitation    a. 05/2016 Echo: EF 65-70%, Gr2DD, mild MR, nl RV fxn, Triv TR, PASP 10mHg; b. 05/2018 Echo: EF 60-65%, Gr2DD, mild MR/TR, RVSP/PASP 357mg; c. 11/2019 Echo: EF 55-60%, no rwma, mild-mod MR, Sev TR w/ RV dilatation (CTA chest neg for PE).  . Type II diabetes mellitus ([facility]  . Vitamin D deficiency     Patient Active Problem List   Diagnosis Date Noted  . Lymphocytopenia 10/13/2020  . Ear hematoma, right, initial encounter 10/13/2020  . Prolonged QT interval 10/13/2020  . Lactic acidosis 10/05/2020  . Transaminitis 10/05/2020  . Acute  respiratory failure with hypoxia ([facility]04/15/2022  . Altered mental status 08/22/2020  . Acute metabolic encephalopathy 0399991111. Severe tricuspid regurgitation 12/04/2019  . ESRD (end stage renal disease) ([facility]  . GERD (gastroesophageal reflux disease)   . Hypertension   . Type II diabetes mellitus ([facility]  . 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 AMS (altered mental status) 04/28/2019  . Hypoxemia 04/28/2019  . Fall 06/09/2018  . Fall at home, initial encounter 06/09/2018  . Anemia of chronic disease 06/09/2018  . Syncope and collapse 06/09/2018  . Acute encephalopathy 03/10/2018  . Hypermagnesemia 03/10/2018  . Acute lower UTI 11/09/2016  . Uncontrolled type 2 diabetes mellitus with complication ([facility]  . 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 associated with diabetes mellitus due to underlying condition ([facility]  . Chronic diastolic CHF (congestive heart failure) ([facility]  . Proliferative diabetic retinopathy (HC[facility]artow11/12/2015  . Poor social situation 03/09/2013  . Depression 03/01/2013  . Abnormal mammogram 12/20/2012  . Retinal detachment 11/17/2012  . 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 ([id number]. Hyperlipidemia 02/09/2007  . Essential hypertension 02/09/2007    Past Surgical History:  Procedure Laterality Date  . AV FISTULA PLACEMENT Left 02/21/2019   Procedure: BRACHIOCEPHALIC ARTERIOVENOUS (AV) FISTULA  CREATION;  Surgeon: [name], MD;  Location: [place];  Service: Vascular;  Laterality: Left;  . BASCILIC VEIN TRANSPOSITION Left 04/11/2019   Procedure: SECOND STAGE BASILIC VEIN TRANSPOSITION LEFT ARM;  Surgeon: [name], MD;  Location: [place];  Service: Vascular;  Laterality: Left;  . IR FLUORO GUIDE CV LINE RIGHT  04/29/2019  . IR US GUIDE VASC ACCESS RIGHT  04/29/2019  . OPEN REDUCTION INTERNAL FIXATION (ORIF) DISTAL RADIAL FRACTURE Left 01/28/2016   Procedure: OPEN REDUCTION INTERNAL FIXATION  (ORIF) DISTAL RADIAL FRACTURE;  Surgeon: Iran Planas, MD;  Location: [place];  Service: Orthopedics;  Laterality: Left;  . [place] CATH N/A 12/05/2019   Procedure: RIGHT HEART CATH;  Surgeon: Nelva Bush, MD;  Location: [place] CV LAB;  Service: Cardiovascular;  Laterality: N/A;  . TONSILLECTOMY AND ADENOIDECTOMY     age 12     OB History   No obstetric history on file.     Family History  Problem Relation Age of Onset  . Stroke Mother   . Diabetes Mother   . Kidney failure Mother   . Heart failure Mother   . Stroke Father   . Cancer Sister        Breast- 33's    Social History   Tobacco Use  . Smoking status: Current Every Day Smoker    Packs/day: 0.30    Years: 32.00    Pack years: 9.60    Types: Cigarettes    Last attempt to quit: 09/23/2012    Years since quitting: 8.0  . Smokeless tobacco: Never Used  . Tobacco comment: 8 cigarettes/day  Vaping Use  . Vaping Use: Never used  Substance Use Topics  . Alcohol use: No  . Drug use: No    Home Medications Prior to Admission medications   Medication Sig Start Date End Date Taking? Authorizing Provider  Amino Acids-Protein Hydrolys (FEEDING SUPPLEMENT, PRO-STAT SUGAR FREE 64,) LIQD Take 30 mLs by mouth in the morning. WILD CHERRY FLAVOR    [provider]  amLODipine (NORVASC) 5 MG tablet Take 1 tablet (5 mg total) by mouth daily for 7 days. 10/09/20 10/16/20  Darliss Cheney, MD  aspirin 81 MG chewable tablet Chew 81 mg by mouth daily.    [provider]  atorvastatin (LIPITOR) 40 MG tablet Take 40 mg by mouth at bedtime.     [provider]  carvedilol (COREG) 6.25 MG tablet Take 1 tablet (6.25 mg total) by mouth 2 (two) times daily with a meal. 10/09/20 11/08/20  Darliss Cheney, MD  ciprofloxacin (CIPRO) 250 MG tablet Take 1 tablet (250 mg total) by mouth daily with breakfast for 7 days. 10/09/20 10/16/20  Darliss Cheney, MD  cloNIDine  (CATAPRES) 0.1 MG tablet Take 0.1 mg by mouth every 8 (eight) hours as needed (BP>160/90).    [provider]  famotidine (PEPCID) 10 MG tablet Take 10 mg by mouth in the morning.    [provider]  FLUoxetine (PROZAC) 20 MG tablet Take 20 mg by mouth daily.    [provider]  HUMALOG 100 UNIT/ML injection Inject 0.05 mLs (5 Units total) into the skin 3 (three) times daily with meals. Patient not taking: No sig reported 08/23/20   Madalyn Rob, MD  metroNIDAZOLE (FLAGYL) 500 MG tablet Take 1 tablet (500 mg total) by mouth 3 (three) times daily for 7 days. 10/09/20 10/16/20  Darliss Cheney, MD  midodrine (PROAMATINE) 10 MG tablet Take 10 mg by mouth as directed. Take 1 tablet (10 mg) prior to Dialysis    [provider]  Multiple Vitamins-Minerals (CERTAVITE SENIOR) TABS Take 1 tablet by mouth daily.    [provider]  omega-3 acid ethyl esters (LOVAZA) 1 g capsule Take 2 capsules by mouth 2 (two) times daily. 05/28/20   [provider]  omeprazole (PRILOSEC) 40 MG capsule Take 40 mg by mouth daily before breakfast.     [provider]  polyethylene glycol powder (GLYCOLAX/MIRALAX) 17 GM/SCOOP powder Take 17 g by mouth See admin instructions. Mix 17 grams of powder into 8 ounces of fluid, stir, and drink (by mouth) once a day    [provider]  SACCHAROMYCES BOULARDII PO Take 1 capsule by mouth 2 (two) times daily.    [provider]  senna-docusate (SENOKOT-S) 8.6-50 MG tablet Take 1 tablet by mouth at bedtime as needed for mild constipation. 06/11/18   Regalado, [name], MD  sevelamer carbonate (RENVELA) 800 MG tablet Take 800 mg by mouth 3 (three) times daily with meals.    [provider]  sodium bicarbonate 650 MG tablet Take 1,300 mg by mouth 2 (two) times daily.    [provider]  Vitamin D, Ergocalciferol, (DRISDOL) 50000 units CAPS capsule Take 1 capsule (50,000 Units total) by mouth every 7  (seven) days. 11/21/16   Ghimire, Henreitta Leber, MD    Allergies    Hydrocodone  Review of Systems   Review of Systems  Unable to perform ROS: Mental status change    Physical Exam Updated Vital Signs BP (!) 165/76   Pulse 94   Temp 98.3 F (36.8 C) (Temporal)   Resp 18   Ht '4\' 6"'$  (1.372 m)   Wt 38.1 kg   LMP 03/06/2013 (LMP Unknown)   SpO2 97%   BMI 20.25 kg/m   Physical Exam Vitals and nursing note reviewed.  Constitutional:      General: She is not in acute distress.    Comments: Altered, chronically ill appearing  HENT:     Head: Normocephalic and atraumatic.     Right Ear: Tympanic membrane normal.     Left Ear: Tympanic membrane and ear canal normal.     Ears:     Comments: Tiny abrasion at entrance to R ear canal which is source of blood on external ear; large hematoma of R earlobe; post-auricular edema and ecchymosis w/ small abrasion    Nose: Nose normal.     Mouth/Throat:     Comments: Abrasion tip of tongue w/ blood in corners of mouth Eyes:     Conjunctiva/sclera: Conjunctivae normal.  Cardiovascular:     Rate and Rhythm: Normal rate and regular rhythm.     Heart sounds: Normal heart sounds. No murmur heard.   Pulmonary:     Effort: Pulmonary effort is normal.     Breath sounds: Normal breath sounds.  Chest:     Chest wall: No tenderness.  Abdominal:     General: Abdomen is flat. Bowel sounds are normal. There is no distension.     Palpations: Abdomen is soft.     Tenderness: There is no abdominal tenderness.  Musculoskeletal:        General: Normal range of motion.     Cervical back: Neck supple. No tenderness.     Right lower leg: No edema.     Left lower leg: No edema.     Comments:  Mild tenderness b/l knees without obvious joint effusion  Skin:    General: Skin is warm and dry.     Comments: Abrasions b/l knees, ecchymoses R dorsal hand, abrasion L elbow  Neurological:     Mental Status: She is alert.     Comments: Disoriented but moving all  4 extremities equally, able to follow some basic commands     ED Results / Procedures / Treatments   Labs (all labs ordered are listed, but only abnormal results are displayed) Labs Reviewed  COMPREHENSIVE METABOLIC PANEL - Abnormal; Notable for the following components:      Result Value   Glucose, Bld 101 (*)    BUN <5 (*)    Creatinine, Ser 1.19 (*)    Calcium 8.3 (*)    Total Protein 5.8 (*)    Albumin 2.6 (*)    GFR, Estimated 52 (*)    All other components within normal limits  CBC - Abnormal; Notable for the following components:   WBC 3.7 (*)    RBC 3.84 (*)    Hemoglobin 11.4 (*)    RDW 17.9 (*)    All other components within normal limits  CBG MONITORING, ED - Abnormal; Notable for the following components:   Glucose-Capillary 104 (*)    All other components within normal limits  SARS CORONAVIRUS 2 (TAT 6-24 HRS)  AMMONIA  MAGNESIUM  TSH  VITAMIN [id number]  BASIC METABOLIC PANEL  CBC WITH DIFFERENTIAL/PLATELET  RPR  URINALYSIS, ROUTINE W REFLEX MICROSCOPIC    EKG EKG Interpretation  Date/Time:  Saturday October 13 2020 18:01:15 EDT Ventricular Rate:  91 PR Interval:  134 QRS Duration: 141 QT Interval:  438 QTC Calculation: 539 R Axis:   151 Text Interpretation: Sinus rhythm Consider right atrial enlargement RBBB and LPFB Repol abnrm suggests ischemia, lateral leads rate faster than previous, otherwise similar Confirmed by Theotis Burrow [phone]) on 10/13/2020 6:09:29 PM   Radiology CT Head Wo Contrast  Result Date: 10/13/2020 CLINICAL DATA:  Head trauma, mod-severe EXAM: CT HEAD WITHOUT CONTRAST TECHNIQUE: Contiguous axial images were obtained from the base of the skull through the vertex without intravenous contrast. COMPARISON:  Head CT 8 days ago 10/05/2020 FINDINGS: Brain: No intracranial hemorrhage, mass effect, or midline shift. No hydrocephalus. The basilar cisterns are patent. Stable degree of atrophy and chronic small vessel ischemia. Again seen multiple  remote lacunar infarct in the bilateral basal ganglia and thalami. Slight ex vacuo dilatation of the left lateral ventricle, stable. No evidence of territorial infarct or acute ischemia. No extra-axial or intracranial fluid collection. Vascular: Atherosclerosis of skullbase vasculature without hyperdense vessel or abnormal calcification. Skull: Temporal bone CT obtained to assess the temporal bones, reported separately. No evidence of skull fracture. Sinuses/Orbits: No acute findings. Other: Right postauricular soft tissue edema, assessed on concurrent temporal bone CT, reported separately. IMPRESSION: 1. No acute intracranial abnormality. No skull fracture. 2. Stable atrophy, chronic small vessel ischemia, and remote lacunar infarcts. 3. Right postauricular soft tissue edema, assessed on concurrent temporal bone CT, reported separately. Electronically Signed   By: Keith Rake M.D.   On: 10/13/2020 18:54   CT Cervical Spine Wo Contrast  Result Date: 10/13/2020 CLINICAL DATA:  Polytrauma, critical, head/C-spine injury suspected EXAM: CT CERVICAL SPINE WITHOUT CONTRAST TECHNIQUE: Multidetector CT imaging of the cervical spine was performed without intravenous contrast. Multiplanar CT image reconstructions were also generated. COMPARISON:  Cervical radiograph 02/29/2020 FINDINGS: Mild motion artifact limits assessment. Alignment: Straightening of normal lordosis. No traumatic subluxation.  Skull base and vertebrae: No acute fracture. Vertebral body heights are maintained. The dens and skull base are intact. Soft tissues and spinal canal: No prevertebral fluid or swelling. No visible canal hematoma. Disc levels: Mild degenerative disc disease most prominent at C4-C5. Occasional facet hypertrophy. Upper chest: No acute or unexpected findings. Patulous upper esophagus. Other: None. IMPRESSION: Mild degenerative change in the cervical spine without acute fracture or subluxation. Electronically Signed   By: Keith Rake M.D.   On: 10/13/2020 18:58   DG Pelvis Portable  Result Date: 10/13/2020 CLINICAL DATA:  Knee contusions after a fall. EXAM: PORTABLE PELVIS 1-2 VIEWS COMPARISON:  None. FINDINGS: There is no evidence of pelvic fracture or diastasis. No pelvic bone lesions are seen. Mild degenerative changes in the lower lumbar spine and hips. IMPRESSION: No acute bony abnormalities. Electronically Signed   By: Lucienne Capers M.D.   On: 10/13/2020 19:04   DG Chest Port 1 View  Result Date: 10/13/2020 CLINICAL DATA:  Fall. EXAM: PORTABLE CHEST 1 VIEW COMPARISON:  10/05/2020. FINDINGS: Stable heart size and mediastinal contours. Aortic atherosclerosis. No pneumothorax, large pleural effusion or focal airspace disease. No acute osseous abnormalities are seen. IMPRESSION: No acute findings. Electronically Signed   By: Keith Rake M.D.   On: 10/13/2020 19:04   DG Knee Complete 4 Views Left  Result Date: 10/13/2020 CLINICAL DATA:  Knee contusions after a fall. EXAM: LEFT KNEE - COMPLETE 4+ VIEW COMPARISON:  None. FINDINGS: No evidence of fracture, dislocation, or joint effusion. No evidence of arthropathy or other focal bone abnormality. Soft tissues are unremarkable. IMPRESSION: Negative. Electronically Signed   By: Lucienne Capers M.D.   On: 10/13/2020 19:04   DG Knee Complete 4 Views Right  Result Date: 10/13/2020 CLINICAL DATA:  Knee contusions after a fall. EXAM: RIGHT KNEE - COMPLETE 4+ VIEW COMPARISON:  None. FINDINGS: Mild degenerative changes with mild medial compartment narrowing. No evidence of acute fracture or dislocation. No focal bone lesion or bone destruction. No effusions. Soft tissues are unremarkable. IMPRESSION: Mild degenerative changes. No acute bony abnormalities. Electronically Signed   By: Lucienne Capers M.D.   On: 10/13/2020 19:05   DG Hand Complete Right  Result Date: 10/13/2020 CLINICAL DATA:  Contusions after a fall. EXAM: RIGHT HAND - COMPLETE 3+ VIEW COMPARISON:  Right  wrist 12/03/2019. FINDINGS: Diffuse bone demineralization. Deformity of the distal radial metaphysis without cortical changes likely represents old fracture deformity. There is an ununited ossicle at the ulnar styloid process likely representing an old injury as well. These correspond to acute injuries seen on prior wrist radiographs from 12/03/2019. degenerative changes in the interphalangeal joints. Mild dorsal soft tissue swelling. No acute fracture or dislocation identified. IMPRESSION: Old fracture deformities of the distal radial metaphysis and ulnar styloid process. No acute fractures identified. Electronically Signed   By: Lucienne Capers M.D.   On: 10/13/2020 19:07   CT Temporal Bones Wo Contrast  Result Date: 10/13/2020 CLINICAL DATA:  Right postauricular swelling. EXAM: CT TEMPORAL BONES WITHOUT CONTRAST TECHNIQUE: Axial and coronal plane CT imaging of the petrous temporal bones was performed with thin-collimation image reconstruction. No intravenous contrast was administered. Multiplanar CT image reconstructions were also generated. COMPARISON:  None. FINDINGS: RIGHT: --Pinna and external auditory canal: Normal. --Ossicular chain: Normal. No erosion or dislocation. --Tympanic membrane: Normal. --Middle ear: Normal. --Epitympanum: The Prussak space is clear. The scutum is sharp. Tegmen tympani is intact. --Cochlea, vestibule, vestibular aqueduct and semicircular canals: Normal. No evidence of canal dehiscence or  otospongiosis. --Internal auditory canal: Normal. No widening of the porus acusticus. --Facial nerve: No focal abnormality along the course of the facial nerve. --Cerebellopontine angle: Normal. --Petrous Apex: Normal. --Mastoids: Normal. --Carotid canal: Normal position. LEFT: --Pinna and external auditory canal: Normal. --Ossicular chain: Normal. No erosion or dislocation. --Tympanic membrane: Normal. --Middle ear: Normal. --Epitympanum: The Prussak space is clear. The scutum is sharp.  Tegmen tympani is intact. --Cochlea, vestibule, vestibular aqueduct and semicircular canals: Normal. No evidence of canal dehiscence or otospongiosis. --Internal auditory canal: Normal. No widening of the porus acusticus. --Facial nerve: No focal abnormality along the course of the facial nerve. --Cerebellopontine angle: Normal. --Petrous Apex: Normal. --Mastoids: Normal. --Carotid canal: Normal position. OTHER: --Visualized intracranial: Normal. --Visualized paranasal sinuses: Normal. --Nasopharynx: Clear. --Temporomandibular joints: Normal. --Visualized extracranial soft tissues: Normal. IMPRESSION: Normal CT of the temporal bones. Electronically Signed   By: Ulyses Jarred M.D.   On: 10/13/2020 19:25    Procedures Procedures   Medications Ordered in ED Medications  aspirin chewable tablet 81 mg (has no administration in time range)  amLODipine (NORVASC) tablet 5 mg (has no administration in time range)  atorvastatin (LIPITOR) tablet 40 mg (has no administration in time range)  carvedilol (COREG) tablet 6.25 mg (has no administration in time range)  midodrine (PROAMATINE) tablet 10 mg (has no administration in time range)  famotidine (PEPCID) tablet 10 mg (has no administration in time range)  pantoprazole (PROTONIX) EC tablet 40 mg (has no administration in time range)  sevelamer carbonate (RENVELA) tablet 800 mg (has no administration in time range)  feeding supplement (PRO-STAT SUGAR FREE 64) liquid 30 mL (has no administration in time range)  insulin aspart (novoLOG) injection 0-6 Units (has no administration in time range)  heparin injection 5,000 Units (has no administration in time range)  acetaminophen (TYLENOL) tablet 650 mg (has no administration in time range)    Or  acetaminophen (TYLENOL) suppository 650 mg (has no administration in time range)  senna-docusate (Senokot-S) tablet 1 tablet (has no administration in time range)  Tdap (BOOSTRIX) injection 0.5 mL (0.5 mLs  Intramuscular Given 10/13/20 1821)    ED Course  I have reviewed the triage vital signs and the nursing notes.  Pertinent labs & imaging results that were available during my care of the patient were reviewed by me and considered in my medical decision making (see chart for details).    MDM Rules/Calculators/A&P                          On arrival, patient disoriented but protecting airway with stable vital signs.  Trauma to right ear but thankfully bleeding is not coming from inner ear, just from external trauma.  Moving all 4 extremities.  CT of head, C-spine, and temporal bones negative acute.  Plain films of extremities negative for acute injury.  Updated tetanus.  I discussed her ear injury with ENT on-call, Dr. Wilburn Cornelia, who advised that patient does not require drainage of her hematoma and it can be managed conservatively with ice packs and elevation of head of bed.  She can follow-up with ENT as needed but does not require acute surgical management.  Reviewed the patient's chart which shows recent hospitalization for possible sepsis with colitis and encephalopathy thought to be multifactorial partly due to uremia, polypharmacy, and respiratory failure.  Patient was just dialyzed today, it is possible that polypharmacy is playing a role but she is currently a poor historian and I cannot appreciate  whether her medications are contributing to her ongoing confusion here.  I have given her several hours of observation during which time she has not returned to a normal mental status.  I consider the possibility of a seizure or syncopal episode in the parking lot leading to her fall.  She is at risk for seizures given previous history of CVA but I would expect that her confusion would resolve if it was due to post ictal state.  Because she mains altered, recommended admission.  Discussed with [facility], Dr. Myna Hidalgo. Final Clinical Impression(s) / ED Diagnoses Final diagnoses:  None    Rx /  DC Orders ED Discharge Orders    None       Adri Schloss, Wenda Overland, MD 10/13/20 2214

## 2020-10-14 DIAGNOSIS — F32A Depression, unspecified: Secondary | ICD-10-CM | POA: Diagnosis present

## 2020-10-14 DIAGNOSIS — G9340 Encephalopathy, unspecified: Secondary | ICD-10-CM | POA: Diagnosis present

## 2020-10-14 DIAGNOSIS — Z23 Encounter for immunization: Secondary | ICD-10-CM | POA: Diagnosis not present

## 2020-10-14 DIAGNOSIS — F17210 Nicotine dependence, cigarettes, uncomplicated: Secondary | ICD-10-CM | POA: Diagnosis present

## 2020-10-14 DIAGNOSIS — I10 Essential (primary) hypertension: Secondary | ICD-10-CM | POA: Diagnosis not present

## 2020-10-14 DIAGNOSIS — E1122 Type 2 diabetes mellitus with diabetic chronic kidney disease: Secondary | ICD-10-CM | POA: Diagnosis present

## 2020-10-14 DIAGNOSIS — Y92481 Parking lot as the place of occurrence of the external cause: Secondary | ICD-10-CM | POA: Diagnosis not present

## 2020-10-14 DIAGNOSIS — Z20822 Contact with and (suspected) exposure to covid-19: Secondary | ICD-10-CM | POA: Diagnosis present

## 2020-10-14 DIAGNOSIS — D72810 Lymphocytopenia: Secondary | ICD-10-CM | POA: Diagnosis present

## 2020-10-14 DIAGNOSIS — A09 Infectious gastroenteritis and colitis, unspecified: Secondary | ICD-10-CM | POA: Diagnosis present

## 2020-10-14 DIAGNOSIS — Z992 Dependence on renal dialysis: Secondary | ICD-10-CM | POA: Diagnosis not present

## 2020-10-14 DIAGNOSIS — R296 Repeated falls: Secondary | ICD-10-CM | POA: Diagnosis present

## 2020-10-14 DIAGNOSIS — N2581 Secondary hyperparathyroidism of renal origin: Secondary | ICD-10-CM | POA: Diagnosis present

## 2020-10-14 DIAGNOSIS — R4182 Altered mental status, unspecified: Secondary | ICD-10-CM | POA: Diagnosis not present

## 2020-10-14 DIAGNOSIS — Z885 Allergy status to narcotic agent status: Secondary | ICD-10-CM | POA: Diagnosis not present

## 2020-10-14 DIAGNOSIS — M810 Age-related osteoporosis without current pathological fracture: Secondary | ICD-10-CM | POA: Diagnosis present

## 2020-10-14 DIAGNOSIS — I4510 Unspecified right bundle-branch block: Secondary | ICD-10-CM | POA: Diagnosis present

## 2020-10-14 DIAGNOSIS — K219 Gastro-esophageal reflux disease without esophagitis: Secondary | ICD-10-CM | POA: Diagnosis present

## 2020-10-14 DIAGNOSIS — E785 Hyperlipidemia, unspecified: Secondary | ICD-10-CM | POA: Diagnosis present

## 2020-10-14 DIAGNOSIS — I081 Rheumatic disorders of both mitral and tricuspid valves: Secondary | ICD-10-CM | POA: Diagnosis present

## 2020-10-14 DIAGNOSIS — I5032 Chronic diastolic (congestive) heart failure: Secondary | ICD-10-CM | POA: Diagnosis present

## 2020-10-14 DIAGNOSIS — I132 Hypertensive heart and chronic kidney disease with heart failure and with stage 5 chronic kidney disease, or end stage renal disease: Secondary | ICD-10-CM | POA: Diagnosis present

## 2020-10-14 DIAGNOSIS — W050XXA Fall from non-moving wheelchair, initial encounter: Secondary | ICD-10-CM | POA: Diagnosis present

## 2020-10-14 DIAGNOSIS — Z66 Do not resuscitate: Secondary | ICD-10-CM | POA: Diagnosis present

## 2020-10-14 DIAGNOSIS — Z8673 Personal history of transient ischemic attack (TIA), and cerebral infarction without residual deficits: Secondary | ICD-10-CM | POA: Diagnosis not present

## 2020-10-14 DIAGNOSIS — S00431A Contusion of right ear, initial encounter: Secondary | ICD-10-CM | POA: Diagnosis present

## 2020-10-14 DIAGNOSIS — N186 End stage renal disease: Secondary | ICD-10-CM | POA: Diagnosis present

## 2020-10-14 DIAGNOSIS — E038 Other specified hypothyroidism: Secondary | ICD-10-CM | POA: Diagnosis present

## 2020-10-14 LAB — GLUCOSE, CAPILLARY
Glucose-Capillary: 163 mg/dL — ABNORMAL HIGH (ref 70–99)
Glucose-Capillary: 61 mg/dL — ABNORMAL LOW (ref 70–99)
Glucose-Capillary: 84 mg/dL (ref 70–99)
Glucose-Capillary: 150 mg/dL — ABNORMAL HIGH (ref 70–99)

## 2020-10-14 LAB — URINALYSIS, ROUTINE W REFLEX MICROSCOPIC
Bacteria, UA: NONE SEEN
Bilirubin Urine: NEGATIVE
Glucose, UA: >=500 mg/dL — AB
Hgb urine dipstick: NEGATIVE
Ketones, ur: NEGATIVE mg/dL
Leukocytes,Ua: NEGATIVE
Nitrite: NEGATIVE
Protein, ur: 100 mg/dL — AB
Specific Gravity, Urine: 1.005 (ref 1.005–1.030)
pH: 9 — ABNORMAL HIGH (ref 5.0–8.0)

## 2020-10-14 LAB — CBC WITH DIFFERENTIAL/PLATELET
Abs Immature Granulocytes: 0.03 10*3/uL (ref 0.00–0.07)
Basophils Absolute: 0.1 10*3/uL (ref 0.0–0.1)
Basophils Relative: 1 %
Eosinophils Absolute: 0.1 10*3/uL (ref 0.0–0.5)
Eosinophils Relative: 2 %
HCT: 35.9 % — ABNORMAL LOW (ref 36.0–46.0)
Hemoglobin: 10.8 g/dL — ABNORMAL LOW (ref 12.0–15.0)
Immature Granulocytes: 1 %
Lymphocytes Relative: 15 %
Lymphs Abs: 0.8 10*3/uL (ref 0.7–4.0)
MCH: 29.1 pg (ref 26.0–34.0)
MCHC: 30.1 g/dL (ref 30.0–36.0)
MCV: 96.8 fL (ref 80.0–100.0)
Monocytes Absolute: 0.9 10*3/uL (ref 0.1–1.0)
Monocytes Relative: 15 %
Neutro Abs: 3.9 10*3/uL (ref 1.7–7.7)
Neutrophils Relative %: 66 %
Platelets: 162 10*3/uL (ref 150–400)
RBC: 3.71 MIL/uL — ABNORMAL LOW (ref 3.87–5.11)
RDW: 18.1 % — ABNORMAL HIGH (ref 11.5–15.5)
WBC: 5.8 10*3/uL (ref 4.0–10.5)
nRBC: 0 % (ref 0.0–0.2)

## 2020-10-14 LAB — BASIC METABOLIC PANEL
Anion gap: 6 (ref 5–15)
BUN: 6 mg/dL (ref 6–20)
CO2: 32 mmol/L (ref 22–32)
Calcium: 8.6 mg/dL — ABNORMAL LOW (ref 8.9–10.3)
Chloride: 101 mmol/L (ref 98–111)
Creatinine, Ser: 1.74 mg/dL — ABNORMAL HIGH (ref 0.44–1.00)
GFR, Estimated: 33 mL/min — ABNORMAL LOW (ref >60)
Glucose, Bld: 140 mg/dL — ABNORMAL HIGH (ref 70–99)
Potassium: 4 mmol/L (ref 3.5–5.1)
Sodium: 139 mmol/L (ref 135–145)

## 2020-10-14 LAB — HEMOGLOBIN A1C
Hgb A1c MFr Bld: 5.9 % — ABNORMAL HIGH (ref 4.8–5.6)
Mean Plasma Glucose: 122.63 mg/dL

## 2020-10-14 LAB — T4, FREE: Free T4: 0.95 ng/dL (ref 0.61–1.12)

## 2020-10-14 LAB — RPR: RPR Ser Ql: NONREACTIVE

## 2020-10-14 LAB — MRSA PCR SCREENING: MRSA by PCR: NEGATIVE

## 2020-10-14 LAB — TSH: TSH: 13.843 u[IU]/mL — ABNORMAL HIGH (ref 0.350–4.500)

## 2020-10-14 LAB — SARS CORONAVIRUS 2 (TAT 6-24 HRS): SARS Coronavirus 2: NEGATIVE

## 2020-10-14 LAB — FOLATE: Folate: 18.8 ng/mL (ref >5.9)

## 2020-10-14 MED ORDER — THIAMINE HCL 100 MG PO TABS
100.0000 mg | ORAL_TABLET | Freq: Every day | ORAL | Status: DC
  Administered 2020-10-14 – 2020-10-15 (×2): 100 mg via ORAL
  Filled 2020-10-14 (×2): qty 1

## 2020-10-14 MED ORDER — THIAMINE HCL 100 MG/ML IJ SOLN: 500.0000 mg | Freq: Three times a day (TID) | INTRAVENOUS | Status: DC

## 2020-10-14 MED ORDER — CHLORHEXIDINE GLUCONATE CLOTH 2 % EX PADS
6.0000 | MEDICATED_PAD | Freq: Every day | CUTANEOUS | Status: DC
  Administered 2020-10-14 – 2020-10-15 (×2): 6 via TOPICAL

## 2020-10-14 MED ORDER — THIAMINE HCL 100 MG/ML IJ SOLN: 100.0000 mg | Freq: Every day | INTRAMUSCULAR | Status: DC

## 2020-10-14 MED ORDER — THIAMINE HCL 100 MG/ML IJ SOLN: 250.0000 mg | Freq: Every day | INTRAVENOUS | Status: DC

## 2020-10-14 MED ORDER — LEVOTHYROXINE SODIUM 50 MCG PO TABS
50.0000 ug | ORAL_TABLET | Freq: Every day | ORAL | Status: DC
  Administered 2020-10-15: 50 ug via ORAL
  Filled 2020-10-14: qty 1

## 2020-10-14 NOTE — Care Management Obs Status (Signed)
[facility] NOTIFICATION   Patient Details  Name: [name] Michael MRN: [id number] Date of Birth: 02-12-1961   Medicare Observation Status Notification Given:  Yes    Carles Collet, RN 10/14/2020, 3:08 PM

## 2020-10-14 NOTE — Progress Notes (Signed)
Hypoglycemic Event  CBG: 61  Treatment:   Symptoms: fatigue  Follow-up CBG: Time: [id number] CBG Result:84  Possible Reasons for Event: poor oral intake  Comments/MD notified: Dr. Florene Glen notified   [address]

## 2020-10-14 NOTE — Progress Notes (Signed)
Transition of Care [facility]) - CAGE-AID Screening   Patient Details  Name: ZAVIAH NASTRI MRN: [id number] Date of Birth: [birth year]  Transition of [facility]) CM/SW Contact:    Clovis Cao, RN Phone Number: 10/14/2020, 6:42 PM   Clinical Narrative: Pt confused after falling out of wheelchair in parking lot post dialysis.  Pt unable to participate in assessment.   CAGE-AID Screening: Substance Abuse Screening unable to be completed due to: : Patient unable to participate

## 2020-10-14 NOTE — Progress Notes (Addendum)
PROGRESS NOTE    [name] Michael  [id number] DOB: [birth year] DOA: 10/13/2020 PCP: Charlott Rakes, MD   No chief complaint on file.  Brief Narrative:  [name] Michael is [name] Michael 59 y.o. female with medical history significant for ESRD on hemodialysis, history of CVA, hypertension, type 2 diabetes mellitus, and chronic diastolic CHF, now presenting to the emergency department with confusion and [name] Michael fall.    Assessment & Plan:   Principal Problem:   Acute encephalopathy Active Problems:   Uncontrolled type 2 diabetes mellitus with complication (HCC)   ESRD (end stage renal disease) (HCC)   Hypertension   Lymphocytopenia   Ear hematoma, right, initial encounter   Prolonged QT interval  1. Acute encephalopathy  - Presents after [name] Michael fall and noted to be confused - Per chart review and discussion with pt's sister, there have been recurrent spells of confusion and weakness with frequent falls for at least one year  - Head CT with right postauricular soft tissue edema, no acute intracranial abnormality  - recent admissions complicated by AMS - oxycodone discontinued at discharge from 4/19 hospitalization - gabapentin and topamax discontinued on 3/3 discharge at last admission - hold prozac - ciprofloxacin also has been d/c'd - will obtain EEG - TBI/concussion possible? - Follow vitamin b1, vitamin b12 wnl, nonreactive RPR, ammonia wnl, folate pending - subclinical hypothyroidism -> will treat - UA and culture pending  - delirium precautions  2. ESRD  - Completed HD 10/13/20  - Continue renal diet, fluid-restrictions, consult nephro for maintenance HD if she remains in hospital on 10/16/20    # Fall - found down, unclear what happened - follow EEG and orthostatics - CT head with right postauricular soft tissue edema, CT C spine without acute fx or subluxation, normal CT of temporal bones, plain films of L and R knee with out acute bony abnormalities, plain films of pelvis negative, plain  film of R hand without acute fx (old fx deformities of distal radial metaphysis and ulnar styloid process) - PT   # Subclinical Hypothyroidism - will treat with TSH >10 and encephalopathy above  3. Ear hematoma  - No underlying fractures, ED physician discussed with ENT who recommended ice and elevating head of bed     4. Prolonged QT interval  - QTc 539 ms in ED  - Minimize QT-prolonging medications, check magnesium level, repeat EKG  5. Hypertension  - Continue Norvasc, Coreg   - on clonidine prn outpatient as well - on hold  6. Type II DM  - A1c was 6.8% in March 2022  - Check CBGs, use SSI   # Hx CVA Continue aspirin, statin  # Hx Depression prozac on hold   # HLD Continue statin  7. Colitis  - Plan to stop ciprofloxacin and Flagyl now in light of unexplained confusion and as she has already received 9 days of antibiotics for possible infectious colitis and symptoms had resolved    # tobacco abuse - enocourage cessation  DVT prophylaxis: heparin  Code Status: full  Family Communication: none at bedside - sister over phone Disposition:   Status is: Observation  The patient will require care spanning > 2 midnights and should be moved to inpatient because: Inpatient level of care appropriate due to severity of illness  Dispo: The patient is from: SNF              Anticipated d/c is to: SNF  Patient currently is not medically stable to d/c.   Difficult to place patient No       Consultants:   Renal  ent over phone  Procedures:  none  Antimicrobials: Anti-infectives (From admission, onward)   None         Subjective: Denies complaints Knows [facility]'s at [facility], April is month.  Says I don't know when asked why she's here.  Objective: Vitals:   10/14/20 0011 10/14/20 0400 10/14/20 0825 10/14/20 1410  BP:  (!) 152/79 (!) 152/71 (!) 150/66  Pulse:  (!) 103 (!) 101 89  Resp:  '18 17 18  '$ Temp:   97.7 F (36.5 C)   TempSrc:   Oral    SpO2:  96% 99% 94%  Weight: 41.1 kg     Height: '4\' 6"'$  (1.372 m)       Intake/Output Summary (Last 24 hours) at 10/14/2020 1431 Last data filed at 10/14/2020 1300 Gross per 24 hour  Intake 240 ml  Output 671 ml  Net -431 ml   Filed Weights   10/13/20 1831 10/14/20 0011  Weight: 38.1 kg 41.1 kg    Examination:  General exam: Appears calm and comfortable  HEENT: R ear with hematoma Respiratory system: Clear to auscultation. Respiratory effort normal. Cardiovascular system: S1 & S2 heard, RRR. Marland Kitchen Gastrointestinal system: Abdomen is nondistended, soft and nontender.  Central nervous system: Alert and oriented x2 (knew [facility] and [facility], couldn't say why she was here - said I don't know). No focal neurological deficits, moving all extremities - poor concentration. Extremities: no lee.  Data Reviewed: I have personally reviewed following labs and imaging studies  CBC: Recent Labs  Lab 10/08/20 0407 10/09/20 0242 10/13/20 1802 10/14/20 0200  WBC 4.7 4.5 3.7* 5.8  NEUTROABS 3.2  --   --  3.9  HGB 10.6* 10.2* 11.4* 10.8*  HCT 35.2* 33.8* 37.1 35.9*  MCV 100.3* 98.3 96.6 96.8  PLT 53* 53* 150 0000000    Basic Metabolic Panel: Recent Labs  Lab 10/08/20 0407 10/09/20 0242 10/13/20 1802 10/13/20 2053 10/14/20 0200  NA 134* 134* 137  --  139  K 3.6 3.3* 4.0  --  4.0  CL 103 101 100  --  101  CO2 23 24 32  --  32  GLUCOSE 129* 123* 101*  --  140*  BUN 10 15 <5*  --  6  CREATININE 3.43* 4.33* 1.19*  --  1.74*  CALCIUM 8.6* 9.1 8.3*  --  8.6*  MG  --   --   --  1.8  --     GFR: Estimated Creatinine Clearance: 19.3 mL/min ([name] Michael) (by C-G formula based on SCr of 1.74 mg/dL (H)).  Liver Function Tests: Recent Labs  Lab 10/08/20 0407 10/09/20 0242 10/13/20 1802  AST 66* 44* 34  ALT 64* 57* 33  ALKPHOS 71 71 86  BILITOT 1.6* 1.4* 0.9  PROT 5.1* 4.8* 5.8*  ALBUMIN 2.2* 2.1* 2.6*    CBG: Recent Labs  Lab 10/08/20 1808 10/09/20 1318 10/13/20 1745 10/13/20 2208  10/14/20 1227  GLUCAP 101* 94 104* 145* 163*     Recent Results (from the past 240 hour(s))  Resp Panel by RT-PCR (Flu [name] Michael&B, Covid) Nasopharyngeal Swab     Status: None   Collection Time: 10/05/20  1:11 PM   Specimen: Nasopharyngeal Swab; Nasopharyngeal(NP) swabs in vial transport medium  Result Value Ref Range Status   SARS Coronavirus 2 by RT PCR NEGATIVE NEGATIVE Final    Comment: (  NOTE) SARS-CoV-2 target nucleic acids are NOT DETECTED.  The SARS-CoV-2 RNA is generally detectable in upper respiratory specimens during the acute phase of infection. The lowest concentration of SARS-CoV-2 viral copies this assay can detect is 138 copies/mL. Johnice Riebe negative result does not preclude SARS-Cov-2 infection and should not be used as the sole basis for treatment or other patient management decisions. Markea Ruzich negative result may occur with  improper specimen collection/handling, submission of specimen other than nasopharyngeal swab, presence of viral mutation(s) within the areas targeted by this assay, and inadequate number of viral copies(<138 copies/mL). Jonquil Stubbe negative result must be combined with clinical observations, patient history, and epidemiological information. The expected result is Negative.  Fact Sheet for Patients:  EntrepreneurPulse.com.au  Fact Sheet for Healthcare Providers:  IncredibleEmployment.be  This test is no t yet approved or cleared by the [place] FDA and  has been authorized for detection and/or diagnosis of SARS-CoV-2 by FDA under an Emergency Use Authorization (EUA). This EUA will remain  in effect (meaning this test can be used) for the duration of the COVID-19 declaration under Section 564(b)(1) of the Act, 21 U.S.C.section 360bbb-3(b)(1), unless the authorization is terminated  or revoked sooner.       Influenza Cornel Werber by PCR NEGATIVE NEGATIVE Final   Influenza B by PCR NEGATIVE NEGATIVE Final    Comment: (NOTE) The Xpert  Xpress SARS-CoV-2/FLU/RSV plus assay is intended as an aid in the diagnosis of influenza from Nasopharyngeal swab specimens and should not be used as Karrina Lye sole basis for treatment. Nasal washings and aspirates are unacceptable for Xpert Xpress SARS-CoV-2/FLU/RSV testing.  Fact Sheet for Patients: EntrepreneurPulse.com.au  Fact Sheet for Healthcare Providers: IncredibleEmployment.be  This test is not yet approved or cleared by the [place] FDA and has been authorized for detection and/or diagnosis of SARS-CoV-2 by FDA under an Emergency Use Authorization (EUA). This EUA will remain in effect (meaning this test can be used) for the duration of the COVID-19 declaration under Section 564(b)(1) of the Act, 21 U.S.C. section 360bbb-3(b)(1), unless the authorization is terminated or revoked.  Performed at [facility], [place] [address] [place], [place] [address]   Blood Culture (routine x 2)     Status: None   Collection Time: 10/05/20  3:40 PM   Specimen: BLOOD RIGHT FOREARM  Result Value Ref Range Status   Specimen Description BLOOD RIGHT FOREARM  Final   Special Requests   Final    BOTTLES DRAWN AEROBIC AND ANAEROBIC Blood Culture results may not be optimal due to an inadequate volume of blood received in culture bottles   Culture   Final    NO GROWTH 5 DAYS Performed at [facility], [place] [address] [place], [place] [address]    Report Status 10/10/2020 FINAL  Final  Blood Culture (routine x 2)     Status: None   Collection Time: 10/05/20 10:10 PM   Specimen: BLOOD RIGHT ARM  Result Value Ref Range Status   Specimen Description BLOOD RIGHT ARM  Final   Special Requests   Final    BOTTLES DRAWN AEROBIC AND ANAEROBIC Blood Culture adequate volume   Culture   Final    NO GROWTH 5 DAYS Performed at [facility], [place] [address] [place], [place] [address]    Report Status 10/11/2020 FINAL  Final  C Difficile Quick Screen w  PCR reflex     Status: None   Collection Time: 10/07/20  5:00 AM   Specimen: STOOL  Result Value Ref Range Status   C Diff antigen NEGATIVE NEGATIVE Final   C Diff toxin NEGATIVE NEGATIVE Final   C Diff interpretation No C. difficile detected.  Final    Comment: Performed at [facility], [place] [address] [place], [place] [address]  Gastrointestinal Panel by PCR , Stool     Status: None   Collection Time: 10/07/20  5:00 AM   Specimen: Stool  Result Value Ref Range Status   Campylobacter species NOT DETECTED NOT DETECTED Final   Plesimonas shigelloides NOT DETECTED NOT DETECTED Final   Salmonella species NOT DETECTED NOT DETECTED Final   Yersinia enterocolitica NOT DETECTED NOT DETECTED Final   Vibrio species NOT DETECTED NOT DETECTED Final   Vibrio cholerae NOT DETECTED NOT DETECTED Final   Enteroaggregative E coli (EAEC) NOT DETECTED NOT DETECTED Final   Enteropathogenic E coli (EPEC) NOT DETECTED NOT DETECTED Final   Enterotoxigenic E coli (ETEC) NOT DETECTED NOT DETECTED Final   Shiga like toxin producing E coli (STEC) NOT DETECTED NOT DETECTED Final   Shigella/Enteroinvasive E coli (EIEC) NOT DETECTED NOT DETECTED Final   Cryptosporidium NOT DETECTED NOT DETECTED Final   Cyclospora cayetanensis NOT DETECTED NOT DETECTED Final   Entamoeba histolytica NOT DETECTED NOT DETECTED Final   Giardia lamblia NOT DETECTED NOT DETECTED Final   Adenovirus F40/41 NOT DETECTED NOT DETECTED Final   Astrovirus NOT DETECTED NOT DETECTED Final   Norovirus GI/GII NOT DETECTED NOT DETECTED Final   Rotavirus Mirtie Bastyr NOT DETECTED NOT DETECTED Final   Sapovirus (I, II, IV, and V) NOT DETECTED NOT DETECTED Final    Comment: Performed at [facility], [address] [place], [place] [address]  SARS CORONAVIRUS 2 (TAT 6-24 HRS) Nasopharyngeal Nasopharyngeal Swab     Status: None   Collection Time: 10/08/20  4:10 PM   Specimen: Nasopharyngeal Swab  Result Value Ref Range Status   SARS  Coronavirus 2 NEGATIVE NEGATIVE Final    Comment: (NOTE) SARS-CoV-2 target nucleic acids are NOT DETECTED.  The SARS-CoV-2 RNA is generally detectable in upper and lower respiratory specimens during the acute phase of infection. Negative results do not preclude SARS-CoV-2 infection, do not rule out co-infections with other pathogens, and should not be used as the sole basis for treatment or other patient management decisions. Negative results must be combined with clinical observations, patient history, and epidemiological information. The expected result is Negative.  Fact Sheet for Patients: SugarRoll.be  Fact Sheet for Healthcare Providers: https://www.woods-mathews.com/  This test is not yet approved or cleared by the [place] FDA and  has been authorized for detection and/or diagnosis of SARS-CoV-2 by FDA under an Emergency Use Authorization (EUA). This EUA will remain  in effect (meaning this test can be used) for the duration of the COVID-19 declaration under Se ction 564(b)(1) of the Act, 21 U.S.C. section 360bbb-3(b)(1), unless the authorization is terminated or revoked sooner.  Performed at [facility], [place] [address] [place] [address], [place], [place] [address]   SARS CORONAVIRUS 2 (TAT 6-24 HRS) Nasopharyngeal Nasopharyngeal Swab     Status: None   Collection Time: 10/13/20  8:53 PM   Specimen: Nasopharyngeal Swab  Result Value Ref Range Status   SARS Coronavirus 2 NEGATIVE NEGATIVE Final    Comment: (NOTE) SARS-CoV-2 target nucleic acids are NOT DETECTED.  The SARS-CoV-2 RNA is generally detectable in upper and lower respiratory specimens during the acute phase of infection. Negative results do not preclude SARS-CoV-2 infection, do not rule out  co-infections with other pathogens, and should not be used as the sole basis for treatment or other patient management decisions. Negative results must be combined with clinical  observations, patient history, and epidemiological information. The expected result is Negative.  Fact Sheet for Patients: SugarRoll.be  Fact Sheet for Healthcare Providers: https://www.woods-mathews.com/  This test is not yet approved or cleared by the [place] FDA and  has been authorized for detection and/or diagnosis of SARS-CoV-2 by FDA under an Emergency Use Authorization (EUA). This EUA will remain  in effect (meaning this test can be used) for the duration of the COVID-19 declaration under Se ction 564(b)(1) of the Act, 21 U.S.C. section 360bbb-3(b)(1), unless the authorization is terminated or revoked sooner.  Performed at [facility], [place] [address] [place], [place] [address]   MRSA PCR Screening     Status: None   Collection Time: 10/14/20 10:28 AM   Specimen: Nasopharyngeal  Result Value Ref Range Status   MRSA by PCR NEGATIVE NEGATIVE Final    Comment:        The GeneXpert MRSA Assay (FDA approved for NASAL specimens only), is one component of Zorah Backes comprehensive MRSA colonization surveillance program. It is not intended to diagnose MRSA infection nor to guide or monitor treatment for MRSA infections. Performed at [facility], [place] [address] [place], [place] [address]          Radiology Studies: CT Head Wo Contrast  Result Date: 10/13/2020 CLINICAL DATA:  Head trauma, mod-severe EXAM: CT HEAD WITHOUT CONTRAST TECHNIQUE: Contiguous axial images were obtained from the base of the skull through the vertex without intravenous contrast. COMPARISON:  Head CT 8 days ago 10/05/2020 FINDINGS: Brain: No intracranial hemorrhage, mass effect, or midline shift. No hydrocephalus. The basilar cisterns are patent. Stable degree of atrophy and chronic small vessel ischemia. Again seen multiple remote lacunar infarct in the bilateral basal ganglia and thalami. Slight ex vacuo dilatation of the left lateral ventricle,  stable. No evidence of territorial infarct or acute ischemia. No extra-axial or intracranial fluid collection. Vascular: Atherosclerosis of skullbase vasculature without hyperdense vessel or abnormal calcification. Skull: Temporal bone CT obtained to assess the temporal bones, reported separately. No evidence of skull fracture. Sinuses/Orbits: No acute findings. Other: Right postauricular soft tissue edema, assessed on concurrent temporal bone CT, reported separately. IMPRESSION: 1. No acute intracranial abnormality. No skull fracture. 2. Stable atrophy, chronic small vessel ischemia, and remote lacunar infarcts. 3. Right postauricular soft tissue edema, assessed on concurrent temporal bone CT, reported separately. Electronically Signed   By: Keith Rake M.D.   On: 10/13/2020 18:54   CT Cervical Spine Wo Contrast  Result Date: 10/13/2020 CLINICAL DATA:  Polytrauma, critical, head/C-spine injury suspected EXAM: CT CERVICAL SPINE WITHOUT CONTRAST TECHNIQUE: Multidetector CT imaging of the cervical spine was performed without intravenous contrast. Multiplanar CT image reconstructions were also generated. COMPARISON:  Cervical radiograph 02/29/2020 FINDINGS: Mild motion artifact limits assessment. Alignment: Straightening of normal lordosis. No traumatic subluxation. Skull base and vertebrae: No acute fracture. Vertebral body heights are maintained. The dens and skull base are intact. Soft tissues and spinal canal: No prevertebral fluid or swelling. No visible canal hematoma. Disc levels: Mild degenerative disc disease most prominent at C4-C5. Occasional facet hypertrophy. Upper chest: No acute or unexpected findings. Patulous upper esophagus. Other: None. IMPRESSION: Mild degenerative change in the cervical spine without acute fracture or subluxation. Electronically Signed   By: Keith Rake M.D.   On: 10/13/2020 18:58  DG Pelvis Portable  Result Date: 10/13/2020 CLINICAL DATA:  Knee contusions after  Kevon Tench fall. EXAM: PORTABLE PELVIS 1-2 VIEWS COMPARISON:  None. FINDINGS: There is no evidence of pelvic fracture or diastasis. No pelvic bone lesions are seen. Mild degenerative changes in the lower lumbar spine and hips. IMPRESSION: No acute bony abnormalities. Electronically Signed   By: Lucienne Capers M.D.   On: 10/13/2020 19:04   DG Chest Port 1 View  Result Date: 10/13/2020 CLINICAL DATA:  Fall. EXAM: PORTABLE CHEST 1 VIEW COMPARISON:  10/05/2020. FINDINGS: Stable heart size and mediastinal contours. Aortic atherosclerosis. No pneumothorax, large pleural effusion or focal airspace disease. No acute osseous abnormalities are seen. IMPRESSION: No acute findings. Electronically Signed   By: Keith Rake M.D.   On: 10/13/2020 19:04   DG Knee Complete 4 Views Left  Result Date: 10/13/2020 CLINICAL DATA:  Knee contusions after Trinisha Paget fall. EXAM: LEFT KNEE - COMPLETE 4+ VIEW COMPARISON:  None. FINDINGS: No evidence of fracture, dislocation, or joint effusion. No evidence of arthropathy or other focal bone abnormality. Soft tissues are unremarkable. IMPRESSION: Negative. Electronically Signed   By: Lucienne Capers M.D.   On: 10/13/2020 19:04   DG Knee Complete 4 Views Right  Result Date: 10/13/2020 CLINICAL DATA:  Knee contusions after Wahneta Derocher fall. EXAM: RIGHT KNEE - COMPLETE 4+ VIEW COMPARISON:  None. FINDINGS: Mild degenerative changes with mild medial compartment narrowing. No evidence of acute fracture or dislocation. No focal bone lesion or bone destruction. No effusions. Soft tissues are unremarkable. IMPRESSION: Mild degenerative changes. No acute bony abnormalities. Electronically Signed   By: Lucienne Capers M.D.   On: 10/13/2020 19:05   DG Hand Complete Right  Result Date: 10/13/2020 CLINICAL DATA:  Contusions after Chani Ghanem fall. EXAM: RIGHT HAND - COMPLETE 3+ VIEW COMPARISON:  Right wrist 12/03/2019. FINDINGS: Diffuse bone demineralization. Deformity of the distal radial metaphysis without cortical  changes likely represents old fracture deformity. There is an ununited ossicle at the ulnar styloid process likely representing an old injury as well. These correspond to acute injuries seen on prior wrist radiographs from 12/03/2019. degenerative changes in the interphalangeal joints. Mild dorsal soft tissue swelling. No acute fracture or dislocation identified. IMPRESSION: Old fracture deformities of the distal radial metaphysis and ulnar styloid process. No acute fractures identified. Electronically Signed   By: Lucienne Capers M.D.   On: 10/13/2020 19:07   CT Temporal Bones Wo Contrast  Result Date: 10/13/2020 CLINICAL DATA:  Right postauricular swelling. EXAM: CT TEMPORAL BONES WITHOUT CONTRAST TECHNIQUE: Axial and coronal plane CT imaging of the petrous temporal bones was performed with thin-collimation image reconstruction. No intravenous contrast was administered. Multiplanar CT image reconstructions were also generated. COMPARISON:  None. FINDINGS: RIGHT: --Pinna and external auditory canal: Normal. --Ossicular chain: Normal. No erosion or dislocation. --Tympanic membrane: Normal. --Middle ear: Normal. --Epitympanum: The Prussak space is clear. The scutum is sharp. Tegmen tympani is intact. --Cochlea, vestibule, vestibular aqueduct and semicircular canals: Normal. No evidence of canal dehiscence or otospongiosis. --Internal auditory canal: Normal. No widening of the porus acusticus. --Facial nerve: No focal abnormality along the course of the facial nerve. --Cerebellopontine angle: Normal. --Petrous Apex: Normal. --Mastoids: Normal. --Carotid canal: Normal position. LEFT: --Pinna and external auditory canal: Normal. --Ossicular chain: Normal. No erosion or dislocation. --Tympanic membrane: Normal. --Middle ear: Normal. --Epitympanum: The Prussak space is clear. The scutum is sharp. Tegmen tympani is intact. --Cochlea, vestibule, vestibular aqueduct and semicircular canals: Normal. No evidence of canal  dehiscence or otospongiosis. --Internal auditory  canal: Normal. No widening of the porus acusticus. --Facial nerve: No focal abnormality along the course of the facial nerve. --Cerebellopontine angle: Normal. --Petrous Apex: Normal. --Mastoids: Normal. --Carotid canal: Normal position. OTHER: --Visualized intracranial: Normal. --Visualized paranasal sinuses: Normal. --Nasopharynx: Clear. --Temporomandibular joints: Normal. --Visualized extracranial soft tissues: Normal. IMPRESSION: Normal CT of the temporal bones. Electronically Signed   By: Ulyses Jarred M.D.   On: 10/13/2020 19:25        Scheduled Meds: . amLODipine  5 mg Oral Daily  . aspirin  81 mg Oral Daily  . atorvastatin  40 mg Oral QHS  . carvedilol  6.25 mg Oral BID WC  . Chlorhexidine Gluconate Cloth  6 each Topical Daily  . famotidine  10 mg Oral Daily  . feeding supplement (PRO-STAT SUGAR FREE 64)  30 mL Oral Daily  . heparin  5,000 Units Subcutaneous Q8H  . insulin aspart  0-6 Units Subcutaneous TID WC  . [START ON 10/16/2020] midodrine  10 mg Oral Q T,Th,Sa-HD  . pantoprazole  40 mg Oral Daily  . sevelamer carbonate  800 mg Oral TID WC   Continuous Infusions:   LOS: 0 days    Time spent: over 30 min    Fayrene Helper, MD [facility]   To contact the attending provider between 7A-7P or the covering provider during after hours 7P-7A, please log into the web site [email or web address] and access using universal [place] password for that web site. If you do not have the password, please call the hospital operator.  10/14/2020, 2:31 PM

## 2020-10-15 ENCOUNTER — Inpatient Hospital Stay (HOSPITAL_COMMUNITY): Payer: Medicare Other

## 2020-10-15 DIAGNOSIS — G9340 Encephalopathy, unspecified: Secondary | ICD-10-CM | POA: Diagnosis not present

## 2020-10-15 DIAGNOSIS — I10 Essential (primary) hypertension: Secondary | ICD-10-CM | POA: Diagnosis not present

## 2020-10-15 DIAGNOSIS — R4182 Altered mental status, unspecified: Secondary | ICD-10-CM

## 2020-10-15 LAB — GLUCOSE, CAPILLARY
Glucose-Capillary: 135 mg/dL — ABNORMAL HIGH (ref 70–99)
Glucose-Capillary: 177 mg/dL — ABNORMAL HIGH (ref 70–99)
Glucose-Capillary: 273 mg/dL — ABNORMAL HIGH (ref 70–99)

## 2020-10-15 LAB — CBC WITH DIFFERENTIAL/PLATELET
Abs Immature Granulocytes: 0.01 10*3/uL (ref 0.00–0.07)
Basophils Absolute: 0.1 10*3/uL (ref 0.0–0.1)
Basophils Relative: 2 %
Eosinophils Absolute: 0.1 10*3/uL (ref 0.0–0.5)
Eosinophils Relative: 2 %
HCT: 34.4 % — ABNORMAL LOW (ref 36.0–46.0)
Hemoglobin: 10.4 g/dL — ABNORMAL LOW (ref 12.0–15.0)
Immature Granulocytes: 0 %
Lymphocytes Relative: 25 %
Lymphs Abs: 1.2 10*3/uL (ref 0.7–4.0)
MCH: 29.6 pg (ref 26.0–34.0)
MCHC: 30.2 g/dL (ref 30.0–36.0)
MCV: 98 fL (ref 80.0–100.0)
Monocytes Absolute: 0.6 10*3/uL (ref 0.1–1.0)
Monocytes Relative: 12 %
Neutro Abs: 2.7 10*3/uL (ref 1.7–7.7)
Neutrophils Relative %: 59 %
Platelets: 204 10*3/uL (ref 150–400)
RBC: 3.51 MIL/uL — ABNORMAL LOW (ref 3.87–5.11)
RDW: 17.8 % — ABNORMAL HIGH (ref 11.5–15.5)
WBC: 4.6 10*3/uL (ref 4.0–10.5)
nRBC: 0 % (ref 0.0–0.2)

## 2020-10-15 LAB — PHOSPHORUS: Phosphorus: 3 mg/dL (ref 2.5–4.6)

## 2020-10-15 LAB — COMPREHENSIVE METABOLIC PANEL
ALT: 24 U/L (ref 0–44)
AST: 30 U/L (ref 15–41)
Albumin: 2.5 g/dL — ABNORMAL LOW (ref 3.5–5.0)
Alkaline Phosphatase: 85 U/L (ref 38–126)
Anion gap: 8 (ref 5–15)
BUN: 16 mg/dL (ref 6–20)
CO2: 27 mmol/L (ref 22–32)
Calcium: 9 mg/dL (ref 8.9–10.3)
Chloride: 102 mmol/L (ref 98–111)
Creatinine, Ser: 3.51 mg/dL — ABNORMAL HIGH (ref 0.44–1.00)
GFR, Estimated: 14 mL/min — ABNORMAL LOW (ref >60)
Glucose, Bld: 128 mg/dL — ABNORMAL HIGH (ref 70–99)
Potassium: 4.6 mmol/L (ref 3.5–5.1)
Sodium: 137 mmol/L (ref 135–145)
Total Bilirubin: 0.9 mg/dL (ref 0.3–1.2)
Total Protein: 5.8 g/dL — ABNORMAL LOW (ref 6.5–8.1)

## 2020-10-15 LAB — URINE CULTURE: Culture: NO GROWTH

## 2020-10-15 LAB — MAGNESIUM: Magnesium: 2.1 mg/dL (ref 1.7–2.4)

## 2020-10-15 MED ORDER — CHLORHEXIDINE GLUCONATE CLOTH 2 % EX PADS: 6.0000 | MEDICATED_PAD | Freq: Every day | CUTANEOUS | Status: DC

## 2020-10-15 MED ORDER — LEVOTHYROXINE SODIUM 50 MCG PO TABS: 50.0000 ug | ORAL_TABLET | Freq: Every day | ORAL | 0 refills | Status: DC

## 2020-10-15 MED ORDER — AMLODIPINE BESYLATE 5 MG PO TABS: 5.0000 mg | ORAL_TABLET | Freq: Every day | ORAL | 0 refills | Status: DC

## 2020-10-15 MED ORDER — THIAMINE HCL 100 MG PO TABS: 100.0000 mg | ORAL_TABLET | Freq: Every day | ORAL | 0 refills | Status: AC

## 2020-10-15 NOTE — Plan of Care (Signed)

## 2020-10-15 NOTE — Procedures (Signed)
Patient Name: [name] Michael  MRN: [id number]  Epilepsy Attending: Lora Havens  Referring Physician/Provider: Dr Fayrene Helper Date: 10/15/2020 Duration: 24.20 mins  Patient history: 60 year old female with altered mental status. EEG to evaluate for seizures.  Level of alertness: Awake  AEDs during EEG study: None  Technical aspects: This EEG study was done with scalp electrodes positioned according to the 10-20 International system of electrode placement. Electrical activity was acquired at a sampling rate of '500Hz'$  and reviewed with a high frequency filter of '70Hz'$  and a low frequency filter of '1Hz'$ . EEG data were recorded continuously and digitally stored.   Description: The posterior dominant rhythm consists of 8-9 Hz activity of moderate voltage (25-35 uV) seen predominantly in posterior head regions, symmetric and reactive to eye opening and eye closing. EEG showed intermittent generalized polymorphic 3 to 6 Hz theta-delta slowing. Physiologic photic driving was not seen during photic stimulation.  Hyperventilation was not performed.     ABNORMALITY - Intermittent slow, generalized  IMPRESSION: This study is suggestive of mild diffuse encephalopathy, nonspecific etiology. No seizures or epileptiform discharges were seen throughout the recording.  Kym Scannell Barbra Sarks

## 2020-10-15 NOTE — Progress Notes (Signed)
EEG complete - results pending 

## 2020-10-15 NOTE — Progress Notes (Signed)
D/C instructions printed and placed in packet for PTAR. Tele and IV removed, tolerated well.

## 2020-10-15 NOTE — TOC Transition Note (Signed)
Transition of Care [facility]) - CM/SW Discharge Note   Patient Details  Name: [name] Michael MRN: [id number] Date of Birth: [birth year]  [facility] CM/SW Contact:  Tresa Endo Phone Number: 10/15/2020, 2:26 PM   Clinical Narrative:    Patient will DC to: [facility] Anticipated DC date: 10/15/2020 Family notified: Pt Daughter VM  Transport by: Corey Harold   Per MD patient ready for DC to [facility] bed B. RN to call report prior to discharge [phone] nurse Amy). RN, patient, patient's family, and facility notified of DC. Discharge Summary and FL2 sent to facility. DC packet on chart. Ambulance transport requested for patient.   CSW will sign off for now as social work intervention is no longer needed. Please consult [name] again if new needs arise.            Patient Goals and CMS Choice        Discharge Placement                       Discharge Plan and Services                                     Social Determinants of Health (SDOH) Interventions     Readmission Risk Interventions Readmission Risk Prevention Plan 12/07/2019 04/29/2019  Transportation Screening Complete Complete  PCP or Specialist Appt within 3-5 Days Complete Complete  HRI or [place] Complete Complete  Social Work Consult for [place] Planning/Counseling Complete Complete  Palliative Care Screening Not Applicable Not Applicable  Medication Review (RN Care Manager) Referral to Pharmacy Referral to Pharmacy  Some recent data might be hidden

## 2020-10-15 NOTE — Discharge Summary (Addendum)
Physician Discharge Summary  [name] Michael [id number] DOB: [birth year] DOA: 10/13/2020  PCP: Charlott Rakes, MD  Admit date: 10/13/2020 Discharge date: 10/15/2020  Time spent: 40 minutes  Recommendations for Outpatient Follow-up:  1. Follow outpatient CBC/CMP 2. Follow mental status outpatient - avoid polypharmacy, adjust regimen as needed 3. Follow with neurology outpatient for her recurrent episodes of AMS 4. Follow pending B1 5. Needs follow up thyroid studies in 4-6 weeks for subclinical hypothyroidism 6. A1c low, hypoglycemia in house, insulin d/c'd - follow outpatient blood sugars and adjust therapy as needed 7. Follow ear hematoma outpatient - consider ENT follow up as needed - ice/elevated head of bed as needed 8.  prozac d/c'd follow mood outpatient 9. Encourage smoking cessation  10. Continue to monitor prolonged qtc outpatient   Discharge Diagnoses:  Principal Problem:   Acute encephalopathy Active Problems:   Uncontrolled type 2 diabetes mellitus with complication (HCC)   AMS (altered mental status)   ESRD (end stage renal disease) (HCC)   Hypertension   Lymphocytopenia   Ear hematoma, right, initial encounter   Prolonged QT interval   Discharge Condition: stable  Diet recommendation: renal  Filed Weights   10/13/20 1831 10/14/20 0011 10/15/20 0104  Weight: 38.1 kg 41.1 kg 40.2 kg    History of present illness:  [name] Michael 60 y.o.femalewith medical history significant forESRD on hemodialysis, history of CVA, hypertension, type 2 diabetes mellitus, and chronic diastolic CHF, now presenting to the emergency department with confusion and [name] Michael fall.  Workup has not shown [name] Michael clear cause, of note, she's had multiple admissions with concern for encephalopathy over the past couple of months.  Some were thought to be related to missed HD and/or polypharmacy.  She was recently discharged on 4/19 for colitis and her oxycodone was discontinued as [name] Michael possible  contributor to her confusion.  She had negative imaging.  Workup was notable for subclinical hypothyroidism.  She was started on synthroid and thiamine with pending B1 study.  EEG was negative for seizures.  She was back to her baseline on hospital day 2 and discharged back to SNF with plan for neurology follow up outpatient and follow pending studies.     Hospital Course:  1.Acute encephalopathy -Presents after Marico Buckle fall and noted to be confused - Per chart review and discussion with pt's sister, there have been recurrent spells of confusion and weakness with frequent falls for at least sixty years -Head CT with right postauricular soft tissue edema, no acute intracranial abnormality  - recent admissions complicated by AMS - oxycodone discontinued at discharge from 4/19 hospitalization - gabapentin and topamax discontinued on 3/3 discharge at last admission - hold prozac at discharge - ciprofloxacin also has been d/c'd - will obtain EEG - mild diffuse encephalopathy, nonsepecific - TBI/concussion possible? - Follow vitamin b1 (pending at discharge), vitamin b12 wnl, nonreactive RPR, ammonia wnl, folate wnl - subclinical hypothyroidism -> will treat - UA and culture no growth  - delirium precautions  2.ESRD -Completed HD 10/13/20 -Continue renal diet, fluid-restrictions, consult nephro for maintenance HD if she remains in hospital on 10/16/20  # Fall - found down, unclear what happened - follow EEG and orthostatics - CT head with right postauricular soft tissue edema, CT C spine without acute fx or subluxation, normal CT of temporal bones, plain films of L and R knee with out acute bony abnormalities, plain films of pelvis negative, plain film of R hand without acute fx (old fx deformities of distal radial  metaphysis and ulnar styloid process) - PT   # Subclinical Hypothyroidism - will treat with TSH >10 and encephalopathy above - follow labs in 4-6 weeks  3.Ear  hematoma -No underlying fractures, ED physician discussed with ENT who recommended ice and elevating head of bed  4.Prolonged QT interval -QTc 539 ms in ED -Minimize QT-prolonging medications, check magnesium level, repeat EKG with qtc 516 - follow outpatient and continue to minimize qt prolonging meds  5.Hypertension -Continue Norvasc, Coreg - on clonidine prn outpatient  6.Type II DM -A1c was 6.8% in March 2022 -A1c 5.9 4/24 - hold insulin at discharge - follow blood sugars  # Hx CVA Continue aspirin, statin  # Hx Depression prozac on hold  Follow with outpatient provider for adjustments prn  # HLD Continue statin  7. Colitis  - stop abx, continue to monitor  # tobacco abuse - enocourage cessation   Procedures: EEG  IMPRESSION: This study is suggestive of mild diffuse encephalopathy, nonspecific etiology. No seizures or epileptiform discharges were seen throughout the recording.    Consultations:  renal  Discharge Exam: Vitals:   10/15/20 0727 10/15/20 1131  BP: (!) 173/87 (!) 174/80  Pulse: (!) 101 (!) 102  Resp: 16 17  Temp: 97.9 F (36.6 C) 98.1 F (36.7 C)  SpO2:  100%   Feels back to her baseline No new complaints Wants to get back home Called sister x2, no answer  General: No acute distress. HEENT: relatively stable R ear hematoma Cardiovascular: Heart sounds show [name] Michael regular rate, and rhythm.  Lungs: Clear to auscultation bilaterally  Abdomen: Soft, nontender, nondistended  Neurological: Alert and oriented 3. Moves all extremities 4. Cranial nerves II through XII grossly intact. Skin: Warm and dry. No rashes or lesions. Extremities: No clubbing or cyanosis. No edema  Discharge Instructions   Discharge Instructions    Ambulatory referral to Neurology   Complete by: As directed    An appointment is requested in approximately: 2 weeks   Call MD for:  difficulty breathing, headache or visual disturbances    Complete by: As directed    Call MD for:  extreme fatigue   Complete by: As directed    Call MD for:  hives   Complete by: As directed    Call MD for:  persistant dizziness or light-headedness   Complete by: As directed    Call MD for:  persistant nausea and vomiting   Complete by: As directed    Call MD for:  redness, tenderness, or signs of infection (pain, swelling, redness, odor or green/yellow discharge around incision site)   Complete by: As directed    Call MD for:  severe uncontrolled pain   Complete by: As directed    Call MD for:  temperature >100.4   Complete by: As directed    Diet - low sodium heart healthy   Complete by: As directed    Discharge instructions   Complete by: As directed    You were seen for confusion after [name] Michael fall.   Your workup has been notable for low thyroid hormone.  You have pending labs that should be followed up at the skilled nursing facility (ask about the vitamin b1 level which is pending at the time of your discharge).  We're not sure what caused your confusion, but this seems to have come back to normal.  We've stopped your prozac.  You also had an EEG which was unremarkable.  Your head imaging did not show any concerning findings.  Since you're back to your baseline we can have you follow outpatient with your PCP and neurology as an outpatient.  They should review your medicines intermittently to make sure you're avoiding anything that could make you confused.  Your outpatient doctor should follow your hematoma on your ear.  You may need to follow up with ENT outpatient.  Follow your mood outpatient since we've stopped the prozac.  Your PCP may need to consider other medications.  You had Astraea Gaughran low blood sugar here, we stopped your insulin.  I think your outpatient provider should continue to follow your blood sugars closely outpatient.  You may need adjustment to your treatment as an outpatient.  Smoking cessation is important for you to consider  for your health.  Please ask your PCP to request records from this hospitalization so they know what was done and what the next steps will be.   Increase activity slowly   Complete by: As directed    No wound care   Complete by: As directed      Allergies as of 10/15/2020      Reactions   Hydrocodone Nausea And Vomiting, Other (See Comments)   "Allergic," per [facility]      Medication List    STOP taking these medications   ciprofloxacin 250 MG tablet Commonly known as: CIPRO   FLUoxetine 20 MG tablet Commonly known as: PROZAC   HumaLOG 100 UNIT/ML injection Generic drug: insulin lispro   metroNIDAZOLE 500 MG tablet Commonly known as: Flagyl     TAKE these medications   amLODipine 5 MG tablet Commonly known as: NORVASC Take 1 tablet (5 mg total) by mouth daily.   aspirin 81 MG chewable tablet Chew 81 mg by mouth daily.   atorvastatin 40 MG tablet Commonly known as: LIPITOR Take 40 mg by mouth at bedtime.   carvedilol 6.25 MG tablet Commonly known as: COREG Take 1 tablet (6.25 mg total) by mouth 2 (two) times daily with Simonne Boulos meal.   cloNIDine 0.1 MG tablet Commonly known as: CATAPRES Take 0.1 mg by mouth every 8 (eight) hours as needed (BP>160/90).   famotidine 10 MG tablet Commonly known as: PEPCID Take 10 mg by mouth in the morning.   feeding supplement (PRO-STAT SUGAR FREE 64) Liqd Take 30 mLs by mouth in the morning. WILD CHERRY FLAVOR   lactose free nutrition Liqd Take 237 mLs by mouth 3 (three) times daily with meals.   levothyroxine 50 MCG tablet Commonly known as: SYNTHROID Take 1 tablet (50 mcg total) by mouth daily at 6 (six) AM. Start taking on: October 16, 2020   midodrine 10 MG tablet Commonly known as: PROAMATINE Take 10 mg by mouth See admin instructions. Take 1 tablet (10 mg) by mouth prior to dialysis on Tuesday, Thursday, Saturday   multivitamin with minerals Tabs tablet Take 1 tablet by mouth daily.   omega-3 acid ethyl esters 1 g  capsule Commonly known as: LOVAZA Take 2 capsules by mouth 2 (two) times daily.   omeprazole 40 MG capsule Commonly known as: PRILOSEC Take 40 mg by mouth daily before breakfast.   polyethylene glycol powder 17 GM/SCOOP powder Commonly known as: GLYCOLAX/MIRALAX Take 17 g by mouth See admin instructions. Mix 17 grams of powder into 8 ounces of fluid, stir, and drink (by mouth) once Everet Flagg day   SACCHAROMYCES BOULARDII PO Take 1 capsule by mouth 2 (two) times daily.   senna-docusate 8.6-50 MG tablet Commonly known as: Senokot-S Take 1 tablet by mouth at  bedtime as needed for mild constipation.   sevelamer carbonate 800 MG tablet Commonly known as: RENVELA Take 800 mg by mouth 3 (three) times daily with meals.   sodium bicarbonate 650 MG tablet Take 1,300 mg by mouth 2 (two) times daily.   thiamine 100 MG tablet Take 1 tablet (100 mg total) by mouth daily. Start taking on: October 16, 2020   Vitamin D (Ergocalciferol) 1.25 MG (50000 UNIT) Caps capsule Commonly known as: DRISDOL Take 1 capsule (50,000 Units total) by mouth every 7 (seven) days.      Allergies  Allergen Reactions  . Hydrocodone Nausea And Vomiting and Other (See Comments)    "Allergic," per [facility]      The results of significant diagnostics from this hospitalization (including imaging, microbiology, ancillary and laboratory) are listed below for reference.    Significant Diagnostic Studies: CT Head Wo Contrast  Result Date: 10/13/2020 CLINICAL DATA:  Head trauma, mod-severe EXAM: CT HEAD WITHOUT CONTRAST TECHNIQUE: Contiguous axial images were obtained from the base of the skull through the vertex without intravenous contrast. COMPARISON:  Head CT 8 days ago 10/05/2020 FINDINGS: Brain: No intracranial hemorrhage, mass effect, or midline shift. No hydrocephalus. The basilar cisterns are patent. Stable degree of atrophy and chronic small vessel ischemia. Again seen multiple remote lacunar infarct in the bilateral  basal ganglia and thalami. Slight ex vacuo dilatation of the left lateral ventricle, stable. No evidence of territorial infarct or acute ischemia. No extra-axial or intracranial fluid collection. Vascular: Atherosclerosis of skullbase vasculature without hyperdense vessel or abnormal calcification. Skull: Temporal bone CT obtained to assess the temporal bones, reported separately. No evidence of skull fracture. Sinuses/Orbits: No acute findings. Other: Right postauricular soft tissue edema, assessed on concurrent temporal bone CT, reported separately. IMPRESSION: 1. No acute intracranial abnormality. No skull fracture. 2. Stable atrophy, chronic small vessel ischemia, and remote lacunar infarcts. 3. Right postauricular soft tissue edema, assessed on concurrent temporal bone CT, reported separately. Electronically Signed   By: Keith Rake M.D.   On: 10/13/2020 18:54   CT Head Wo Contrast  Result Date: 10/05/2020 CLINICAL DATA:  Altered mental status and weakness EXAM: CT HEAD WITHOUT CONTRAST TECHNIQUE: Contiguous axial images were obtained from the base of the skull through the vertex without intravenous contrast. COMPARISON:  08/21/2020 FINDINGS: Brain: No evidence of acute infarction, hemorrhage, hydrocephalus, extra-axial collection or mass lesion/mass effect. Chronic atrophic and ischemic changes are again noted and stable. 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 Vascular: No hyperdense vessel or unexpected calcification. Skull: Normal. Negative for fracture or focal lesion. Sinuses/Orbits: No acute finding. Other: None. IMPRESSION: Chronic atrophic and ischemic changes without acute intracranial abnormality. Electronically Signed   By: Inez Catalina M.D.   On: 10/05/2020 19:14   CT Cervical Spine Wo Contrast  Result Date: 10/13/2020 CLINICAL DATA:  Polytrauma, critical, head/C-spine injury suspected EXAM: CT CERVICAL SPINE WITHOUT CONTRAST TECHNIQUE: Multidetector CT imaging  of the cervical spine was performed without intravenous contrast. Multiplanar CT image reconstructions were also generated. COMPARISON:  Cervical radiograph 02/29/2020 FINDINGS: Mild motion artifact limits assessment. Alignment: Straightening of normal lordosis. No traumatic subluxation. Skull base and vertebrae: No acute fracture. Vertebral body heights are maintained. The dens and skull base are intact. Soft tissues and spinal canal: No prevertebral fluid or swelling. No visible canal hematoma. Disc levels: Mild degenerative disc disease most prominent at C4-C5. Occasional facet hypertrophy. Upper chest: No acute or unexpected findings. Patulous upper esophagus. Other: None. IMPRESSION: Mild degenerative change  in the cervical spine without acute fracture or subluxation. Electronically Signed   By: Keith Rake M.D.   On: 10/13/2020 18:58   US Abdomen Complete  Result Date: 10/05/2020 CLINICAL DATA:  Abdominal pain, elevated liver function tests, elevated bilirubin EXAM: ABDOMEN ULTRASOUND COMPLETE COMPARISON:  04/28/2019 FINDINGS: Gallbladder: Not visualized. Common bile duct: Diameter: 3 mm Liver: Mild heterogeneous increased liver echotexture is nonspecific, but could reflect hepatic steatosis. No focal liver abnormality. No intrahepatic biliary duct dilation. Portal vein is patent on color Doppler imaging with normal direction of blood flow towards the liver. IVC: Not well visualized due to bowel gas Pancreas: Visualized portion unremarkable. Spleen: Size and appearance within normal limits. Right Kidney: Length: 7.5 cm. Echogenicity within normal limits. No mass or hydronephrosis visualized. Left Kidney: Length: 6.7 cm. Echogenicity within normal limits. No mass or hydronephrosis visualized. Abdominal aorta: Proximal aorta is unremarkable. Distal aorta and bifurcation not visualized due to bowel gas. Other findings: Incidental right pleural effusion. IMPRESSION: 1. Mild heterogeneous increased liver  echotexture compatible with hepatic steatosis. 2. Nonvisualization of the gallbladder, likely due to decompressed state. 3. Limited study due to bowel gas. 4. Right pleural effusion. Electronically Signed   By: Randa Ngo M.D.   On: 10/05/2020 18:45   CT ABDOMEN PELVIS W CONTRAST  Result Date: 10/05/2020 CLINICAL DATA:  Abdominal abscess/infection suspected. Mental status changes. Weakness. Dialysis patient without full dialysis treatment since last Thursday. EXAM: CT ABDOMEN AND PELVIS WITH CONTRAST TECHNIQUE: Multidetector CT imaging of the abdomen and pelvis was performed using the standard protocol following bolus administration of intravenous contrast. CONTRAST:  75m OMNIPAQUE IOHEXOL 300 MG/ML  SOLN COMPARISON:  04/28/2019 FINDINGS: Lower chest: There are bilateral pleural effusions. The heart is enlarged. No pericardial effusion. Hepatobiliary: Small amount of ascites surrounding the liver. Liver is mildly heterogeneous without focal lesion. Gallbladder is decompressed. Pancreas: Unremarkable. No pancreatic ductal dilatation or surrounding inflammatory changes. Spleen: Normal in size without focal abnormality. Adrenals/Urinary Tract: Normal appearance of both adrenal glands. Small bilateral kidneys. Mild bilateral perinephric nonspecific stranding. There is no hydronephrosis or suspicious renal mass. The ureters are unremarkable. The bladder and visualized portion of the urethra are normal. Stomach/Bowel: Stomach is unremarkable. Small bowel loops are unremarkable. There is thickening of the ascending and proximal transverse colon consistent with colitis. The appendix is well seen and has Chanse Kagel normal appearance. Vascular/Lymphatic: There is dense atherosclerotic calcification of the abdominal aorta. Although involved by atherosclerosis, there is vascular opacification of the celiac axis, superior mesenteric artery, and inferior mesenteric artery. Normal appearance of the portal venous system and  inferior vena cava. The No retroperitoneal or mesenteric adenopathy. Reproductive: Uterus is present. Chancelor Hardrick 1.5 centimeter low-attenuation lesion is identified within the LEFT ovary. The region of the RIGHT ovary is unremarkable. Kelcey Korus small amount of free pelvic fluid. Other: Anterior abdominal wall is unremarkable. Musculoskeletal: No acute or significant osseous findings. IMPRESSION: 1. Thickening of the ascending and proximal transverse colon consistent with colitis, likely infectious or inflammatory. 2. Small amount of ascites. 3. Bilateral pleural effusions. 4. Cardiomegaly. 5. Small bilateral kidneys. 6. 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 No follow-up imaging recommended. Note: This recommendation does not apply to premenarchal patients and to those with increased risk (genetic, family history, elevated tumor markers or other high-risk factors) of ovarian cancer. Reference: JACR 2020 Feb; 17(2):248-254 7. Liver is mildly heterogeneous without focal lesion. 8. Aortic Atherosclerosis (ICD10-I70.0). Electronically Signed   By: ENolon NationsM.D.   On:  10/05/2020 19:23   DG Pelvis Portable  Result Date: 10/13/2020 CLINICAL DATA:  Knee contusions after Orren Pietsch fall. EXAM: PORTABLE PELVIS 1-2 VIEWS COMPARISON:  None. FINDINGS: There is no evidence of pelvic fracture or diastasis. No pelvic bone lesions are seen. Mild degenerative changes in the lower lumbar spine and hips. IMPRESSION: No acute bony abnormalities. Electronically Signed   By: Lucienne Capers M.D.   On: 10/13/2020 19:04   DG Chest Port 1 View  Result Date: 10/13/2020 CLINICAL DATA:  Fall. EXAM: PORTABLE CHEST 1 VIEW COMPARISON:  10/05/2020. FINDINGS: Stable heart size and mediastinal contours. Aortic atherosclerosis. No pneumothorax, large pleural effusion or focal airspace disease. No acute osseous abnormalities are seen. IMPRESSION: No acute findings. Electronically Signed   By: Keith Rake M.D.   On:  10/13/2020 19:04   DG Chest Portable 1 View  Result Date: 10/05/2020 CLINICAL DATA:  Weakness and shortness of breath EXAM: PORTABLE CHEST 1 VIEW COMPARISON:  Portable exam 1323 hours compared to 08/21/2020 FINDINGS: Enlargement of cardiac silhouette. Mediastinal contours and pulmonary vascularity normal. Atherosclerotic calcification aorta. Chronic peribronchial thickening. No pulmonary infiltrate, pleural effusion, or pneumothorax. Eventration RIGHT diaphragm stable. Bones demineralized. IMPRESSION: Chronic bronchitic changes without infiltrate. Enlargement of cardiac silhouette. Aortic Atherosclerosis (ICD10-I70.0). Electronically Signed   By: Lavonia Dana M.D.   On: 10/05/2020 13:51   DG Knee Complete 4 Views Left  Result Date: 10/13/2020 CLINICAL DATA:  Knee contusions after Rawleigh Rode fall. EXAM: LEFT KNEE - COMPLETE 4+ VIEW COMPARISON:  None. FINDINGS: No evidence of fracture, dislocation, or joint effusion. No evidence of arthropathy or other focal bone abnormality. Soft tissues are unremarkable. IMPRESSION: Negative. Electronically Signed   By: Lucienne Capers M.D.   On: 10/13/2020 19:04   DG Knee Complete 4 Views Right  Result Date: 10/13/2020 CLINICAL DATA:  Knee contusions after Shawnte Winton fall. EXAM: RIGHT KNEE - COMPLETE 4+ VIEW COMPARISON:  None. FINDINGS: Mild degenerative changes with mild medial compartment narrowing. No evidence of acute fracture or dislocation. No focal bone lesion or bone destruction. No effusions. Soft tissues are unremarkable. IMPRESSION: Mild degenerative changes. No acute bony abnormalities. Electronically Signed   By: Lucienne Capers M.D.   On: 10/13/2020 19:05   DG Hand Complete Right  Result Date: 10/13/2020 CLINICAL DATA:  Contusions after Ceri Mayer fall. EXAM: RIGHT HAND - COMPLETE 3+ VIEW COMPARISON:  Right wrist 12/03/2019. FINDINGS: Diffuse bone demineralization. Deformity of the distal radial metaphysis without cortical changes likely represents old fracture deformity. There  is an ununited ossicle at the ulnar styloid process likely representing an old injury as well. These correspond to acute injuries seen on prior wrist radiographs from 12/03/2019. degenerative changes in the interphalangeal joints. Mild dorsal soft tissue swelling. No acute fracture or dislocation identified. IMPRESSION: Old fracture deformities of the distal radial metaphysis and ulnar styloid process. No acute fractures identified. Electronically Signed   By: Lucienne Capers M.D.   On: 10/13/2020 19:07   EEG adult  Result Date: 10/15/2020 [name] Havens, MD     10/15/2020 12:43 PM Patient Name: [name] Michael MRN: [id number] Epilepsy Attending: Lora Michael Referring Physician/Provider: Dr Fayrene Helper Date: 10/15/2020 Duration: 24.20 mins Patient history: 60 year old female with altered mental status. EEG to evaluate for seizures. Level of alertness: Awake AEDs during EEG study: None Technical aspects: This EEG study was done with scalp electrodes positioned according to the 10-20 International system of electrode placement. Electrical activity was acquired at Zakeya Junker sampling rate of '500Hz'$  and reviewed with Trever Streater high  frequency filter of '70Hz'$  and Myka Lukins low frequency filter of '1Hz'$ . EEG data were recorded continuously and digitally stored. Description: The posterior dominant rhythm consists of 8-9 Hz activity of moderate voltage (25-35 uV) seen predominantly in posterior head regions, symmetric and reactive to eye opening and eye closing. EEG showed intermittent generalized polymorphic 3 to 6 Hz theta-delta slowing. Physiologic photic driving was not seen during photic stimulation.  Hyperventilation was not performed.   ABNORMALITY - Intermittent slow, generalized IMPRESSION: This study is suggestive of mild diffuse encephalopathy, nonspecific etiology. No seizures or epileptiform discharges were seen throughout the recording. [name] Michael   ECHOCARDIOGRAM COMPLETE  Result Date: 10/05/2020    ECHOCARDIOGRAM  REPORT   Patient Name:   [name] Michael Date of Exam: 10/05/2020 Medical Rec #:  [id number]      Height:       54.0 in Accession #:    [id number]     Weight:       98.0 lb Date of Birth:  07/25/1960      BSA:          1.277 m Patient Age:    41 years       BP:           163/53 mmHg Patient Gender: F              HR:           67 bpm. Exam Location:  Inpatient Procedure: 2D Echo Indications:    Cardiomegaly  History:        Patient has prior history of Echocardiogram examinations, most                 recent 12/03/2019. End stage renal disease; Risk                 Factors:Hypertension, Dyslipidemia and Diabetes.  Sonographer:    Johny Chess Referring Phys: [id number] [place]  1. Left ventricular ejection fraction, by estimation, is 50 to 55%. The left ventricle has low normal function. The left ventricle has no regional wall motion abnormalities. Left ventricular diastolic parameters are indeterminate. There is the interventricular septum is flattened in systole and diastole, consistent with right ventricular pressure and volume overload.  2. Right ventricular systolic function is mildly reduced. The right ventricular size is severely enlarged.  3. Left atrial size was moderately dilated.  4. Right atrial size was moderately dilated.  5. Tyrek Lawhorn small pericardial effusion is present.  6. The mitral valve is normal in structure. Mild to moderate mitral valve regurgitation.  7. 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  8. The aortic valve is tricuspid. Aortic valve regurgitation is not visualized. No aortic stenosis is present.  9. The inferior vena cava is dilated in size with <50% respiratory variability, suggesting right atrial pressure of 15 mmHg. Comparison(s): Changes from prior study are noted. EF slightly decreased from prior. Conclusion(s)/Recommendation(s): Severe TR, with non-coaptation of leaflets. This likely causes the RVSP to be inaccurate  due to partial equalization of pressures. Ascites also seen on subcostal imaging. FINDINGS  Left Ventricle: Left ventricular ejection fraction, by estimation, is 50 to 55%. The left ventricle has low normal function. The left ventricle has no regional wall motion abnormalities. The left ventricular internal cavity size was normal in size. There is no left ventricular hypertrophy. The interventricular septum is flattened in systole and diastole, consistent with right ventricular pressure and volume overload. Left  ventricular diastolic parameters are indeterminate. Right Ventricle: RVSP inaccurate due to possibility of equalization of RV/RA pressure from severe TR. The right ventricular size is severely enlarged. Right vetricular wall thickness was not well visualized. Right ventricular systolic function is mildly reduced. The tricuspid regurgitant velocity is 2.13 m/s, and with an assumed right atrial pressure of 15 mmHg, the estimated right ventricular systolic pressure is [id number] mmHg. Left Atrium: Left atrial size was moderately dilated. Right Atrium: Right atrial size was moderately dilated. Pericardium: Sabien Umland small pericardial effusion is present. Mitral Valve: The mitral valve is normal in structure. Mild to moderate mitral valve regurgitation. 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 The tricuspid valve is normal in structure. 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 Aortic Valve: The aortic valve is tricuspid. Aortic valve regurgitation is not visualized. No aortic stenosis is present. Pulmonic Valve: The pulmonic valve was not well visualized. Pulmonic valve regurgitation is not visualized. Aorta: The aortic root, ascending aorta and aortic arch are all structurally normal, with no evidence of dilitation or obstruction. Venous: The inferior vena cava is dilated in size with less than 50% respiratory variability, suggesting right atrial pressure of 15 mmHg. IAS/Shunts: The atrial septum is  grossly normal. Additional Comments: Moderate ascites is present.  LEFT VENTRICLE PLAX 2D LVIDd:         4.40 cm     Diastology LVIDs:         3.30 cm     LV e' medial:    4.90 cm/s LV PW:         1.00 cm     LV E/e' medial:  14.3 LV IVS:        0.80 cm     LV e' lateral:   7.51 cm/s LVOT diam:     1.50 cm     LV E/e' lateral: 9.4 LV SV:         27 LV SV Index:   21 LVOT Area:     1.77 cm  LV Volumes (MOD) LV vol d, MOD A4C: 69.1 ml LV vol s, MOD A4C: 38.3 ml LV SV MOD A4C:     69.1 ml RIGHT VENTRICLE            IVC RV S prime:     9.36 cm/s  IVC diam: 2.20 cm TAPSE (M-mode): 1.7 cm LEFT ATRIUM             Index       RIGHT ATRIUM           Index LA diam:        3.80 cm 2.97 cm/m  RA Area:     15.10 cm LA Vol (A2C):   43.2 ml 33.82 ml/m RA Volume:   36.10 ml  28.26 ml/m LA Vol (A4C):   39.8 ml 31.16 ml/m LA Biplane Vol: 42.6 ml 33.35 ml/m  AORTIC VALVE LVOT Vmax:   74.40 cm/s LVOT Vmean:  44.800 cm/s LVOT VTI:    0.150 m  AORTA Ao Root diam: 2.50 cm Ao Asc diam:  2.40 cm MITRAL VALVE               TRICUSPID VALVE MV Area (PHT): 4.60 cm    TR Peak grad:   18.1 mmHg MV Decel Time: 165 msec    TR Vmax:        213.00 cm/s MV E velocity: 70.30 cm/s MV Amandalynn Pitz velocity: 68.60 cm/s  SHUNTS MV E/Julieanna Geraci ratio:  1.02  Systemic VTI:  0.15 m                            Systemic Diam: 1.50 cm Buford Dresser MD Electronically signed by Buford Dresser MD Signature Date/Time: 10/05/2020/7:59:01 PM    Final    CT Temporal Bones Wo Contrast  Result Date: 10/13/2020 CLINICAL DATA:  Right postauricular swelling. EXAM: CT TEMPORAL BONES WITHOUT CONTRAST TECHNIQUE: Axial and coronal plane CT imaging of the petrous temporal bones was performed with thin-collimation image reconstruction. No intravenous contrast was administered. Multiplanar CT image reconstructions were also generated. COMPARISON:  None. FINDINGS: RIGHT: --Pinna and external auditory canal: Normal. --Ossicular chain: Normal. No erosion or dislocation.  --Tympanic membrane: Normal. --Middle ear: Normal. --Epitympanum: The Prussak space is clear. The scutum is sharp. Tegmen tympani is intact. --Cochlea, vestibule, vestibular aqueduct and semicircular canals: Normal. No evidence of canal dehiscence or otospongiosis. --Internal auditory canal: Normal. No widening of the porus acusticus. --Facial nerve: No focal abnormality along the course of the facial nerve. --Cerebellopontine angle: Normal. --Petrous Apex: Normal. --Mastoids: Normal. --Carotid canal: Normal position. LEFT: --Pinna and external auditory canal: Normal. --Ossicular chain: Normal. No erosion or dislocation. --Tympanic membrane: Normal. --Middle ear: Normal. --Epitympanum: The Prussak space is clear. The scutum is sharp. Tegmen tympani is intact. --Cochlea, vestibule, vestibular aqueduct and semicircular canals: Normal. No evidence of canal dehiscence or otospongiosis. --Internal auditory canal: Normal. No widening of the porus acusticus. --Facial nerve: No focal abnormality along the course of the facial nerve. --Cerebellopontine angle: Normal. --Petrous Apex: Normal. --Mastoids: Normal. --Carotid canal: Normal position. OTHER: --Visualized intracranial: Normal. --Visualized paranasal sinuses: Normal. --Nasopharynx: Clear. --Temporomandibular joints: Normal. --Visualized extracranial soft tissues: Normal. IMPRESSION: Normal CT of the temporal bones. Electronically Signed   By: Ulyses Jarred M.D.   On: 10/13/2020 19:25    Microbiology: Recent Results (from the past 240 hour(s))  Blood Culture (routine x 2)     Status: None   Collection Time: 10/05/20  3:40 PM   Specimen: BLOOD RIGHT FOREARM  Result Value Ref Range Status   Specimen Description BLOOD RIGHT FOREARM  Final   Special Requests   Final    BOTTLES DRAWN AEROBIC AND ANAEROBIC Blood Culture results may not be optimal due to an inadequate volume of blood received in culture bottles   Culture   Final    NO GROWTH 5 DAYS Performed at  [facility], [place] [address], [place], [place] [address]    Report Status 10/10/2020 FINAL  Final  Blood Culture (routine x 2)     Status: None   Collection Time: 10/05/20 10:10 PM   Specimen: BLOOD RIGHT ARM  Result Value Ref Range Status   Specimen Description BLOOD RIGHT ARM  Final   Special Requests   Final    BOTTLES DRAWN AEROBIC AND ANAEROBIC Blood Culture adequate volume   Culture   Final    NO GROWTH 5 DAYS Performed at [facility], [place] [address], [place], [place] [address]    Report Status 10/11/2020 FINAL  Final  C Difficile Quick Screen w PCR reflex     Status: None   Collection Time: 10/07/20  5:00 AM   Specimen: STOOL  Result Value Ref Range Status   C Diff antigen NEGATIVE NEGATIVE Final   C Diff toxin NEGATIVE NEGATIVE Final   C Diff interpretation No C. difficile detected.  Final    Comment: Performed at [facility]  Lab, [address], [place], [place] [address]  Gastrointestinal Panel by PCR , Stool     Status: None   Collection Time: 10/07/20  5:00 AM   Specimen: Stool  Result Value Ref Range Status   Campylobacter species NOT DETECTED NOT DETECTED Final   Plesimonas shigelloides NOT DETECTED NOT DETECTED Final   Salmonella species NOT DETECTED NOT DETECTED Final   Yersinia enterocolitica NOT DETECTED NOT DETECTED Final   Vibrio species NOT DETECTED NOT DETECTED Final   Vibrio cholerae NOT DETECTED NOT DETECTED Final   Enteroaggregative E coli (EAEC) NOT DETECTED NOT DETECTED Final   Enteropathogenic E coli (EPEC) NOT DETECTED NOT DETECTED Final   Enterotoxigenic E coli (ETEC) NOT DETECTED NOT DETECTED Final   Shiga like toxin producing E coli (STEC) NOT DETECTED NOT DETECTED Final   Shigella/Enteroinvasive E coli (EIEC) NOT DETECTED NOT DETECTED Final   Cryptosporidium NOT DETECTED NOT DETECTED Final   Cyclospora cayetanensis NOT DETECTED NOT DETECTED Final   Entamoeba histolytica NOT DETECTED NOT DETECTED Final   Giardia lamblia NOT  DETECTED NOT DETECTED Final   Adenovirus F40/41 NOT DETECTED NOT DETECTED Final   Astrovirus NOT DETECTED NOT DETECTED Final   Norovirus GI/GII NOT DETECTED NOT DETECTED Final   Rotavirus Tenasia Aull NOT DETECTED NOT DETECTED Final   Sapovirus (I, II, IV, and V) NOT DETECTED NOT DETECTED Final    Comment: Performed at [facility], [address] [place], [place] [address]  SARS CORONAVIRUS 2 (TAT 6-24 HRS) Nasopharyngeal Nasopharyngeal Swab     Status: None   Collection Time: 10/08/20  4:10 PM   Specimen: Nasopharyngeal Swab  Result Value Ref Range Status   SARS Coronavirus 2 NEGATIVE NEGATIVE Final    Comment: (NOTE) SARS-CoV-2 target nucleic acids are NOT DETECTED.  The SARS-CoV-2 RNA is generally detectable in upper and lower respiratory specimens during the acute phase of infection. Negative results do not preclude SARS-CoV-2 infection, do not rule out co-infections with other pathogens, and should not be used as the sole basis for treatment or other patient management decisions. Negative results must be combined with clinical observations, patient history, and epidemiological information. The expected result is Negative.  Fact Sheet for Patients: SugarRoll.be  Fact Sheet for Healthcare Providers: https://www.woods-mathews.com/  This test is not yet approved or cleared by the [place] FDA and  has been authorized for detection and/or diagnosis of SARS-CoV-2 by FDA under an Emergency Use Authorization (EUA). This EUA will remain  in effect (meaning this test can be used) for the duration of the COVID-19 declaration under Se ction 564(b)(1) of the Act, 21 U.S.C. section 360bbb-3(b)(1), unless the authorization is terminated or revoked sooner.  Performed at [facility], [place] [name] [place], [place] [address]   SARS CORONAVIRUS 2 (TAT 6-24 HRS) Nasopharyngeal Nasopharyngeal Swab     Status: None   Collection Time:  10/13/20  8:53 PM   Specimen: Nasopharyngeal Swab  Result Value Ref Range Status   SARS Coronavirus 2 NEGATIVE NEGATIVE Final    Comment: (NOTE) SARS-CoV-2 target nucleic acids are NOT DETECTED.  The SARS-CoV-2 RNA is generally detectable in upper and lower respiratory specimens during the acute phase of infection. Negative results do not preclude SARS-CoV-2 infection, do not rule out co-infections with other pathogens, and should not be used as the sole basis for treatment or other patient management decisions. Negative results must be combined with clinical observations, patient history, and epidemiological information. The expected result is Negative.  Fact  Sheet for Patients: SugarRoll.be  Fact Sheet for Healthcare Providers: https://www.woods-mathews.com/  This test is not yet approved or cleared by the [place] FDA and  has been authorized for detection and/or diagnosis of SARS-CoV-2 by FDA under an Emergency Use Authorization (EUA). This EUA will remain  in effect (meaning this test can be used) for the duration of the COVID-19 declaration under Se ction 564(b)(1) of the Act, 21 U.S.C. section 360bbb-3(b)(1), unless the authorization is terminated or revoked sooner.  Performed at [facility], [place] [address], [place], [place] [address]   MRSA PCR Screening     Status: None   Collection Time: 10/14/20 10:28 AM   Specimen: Nasopharyngeal  Result Value Ref Range Status   MRSA by PCR NEGATIVE NEGATIVE Final    Comment:        The GeneXpert MRSA Assay (FDA approved for NASAL specimens only), is one component of Andrya Roppolo comprehensive MRSA colonization surveillance program. It is not intended to diagnose MRSA infection nor to guide or monitor treatment for MRSA infections. Performed at [facility], [place] [address], [place], [place] [address]   Culture, Urine     Status: None   Collection Time: 10/14/20 11:15 AM    Specimen: Urine, Catheterized  Result Value Ref Range Status   Specimen Description URINE, CATHETERIZED  Final   Special Requests NONE  Final   Culture   Final    NO GROWTH Performed at [place] [address] [place], [place] [address]    Report Status 10/15/2020 FINAL  Final     Labs: Basic Metabolic Panel: Recent Labs  Lab 10/09/20 0242 10/13/20 1802 10/13/20 2053 10/14/20 0200 10/15/20 0509  NA 134* 137  --  139 137  K 3.3* 4.0  --  4.0 4.6  CL 101 100  --  101 102  CO2 24 32  --  32 27  GLUCOSE 123* 101*  --  140* 128*  BUN 15 <5*  --  6 16  CREATININE 4.33* 1.19*  --  1.74* 3.51*  CALCIUM 9.1 8.3*  --  8.6* 9.0  MG  --   --  1.8  --  2.1  PHOS  --   --   --   --  3.0   Liver Function Tests: Recent Labs  Lab 10/09/20 0242 10/13/20 1802 10/15/20 0509  AST 44* 34 30  ALT 57* 33 24  ALKPHOS 71 86 85  BILITOT 1.4* 0.9 0.9  PROT 4.8* 5.8* 5.8*  ALBUMIN 2.1* 2.6* 2.5*   No results for input(s): LIPASE, AMYLASE in the last 168 hours. Recent Labs  Lab 10/13/20 2053  AMMONIA 21   CBC: Recent Labs  Lab 10/09/20 0242 10/13/20 1802 10/14/20 0200 10/15/20 0509  WBC 4.5 3.7* 5.8 4.6  NEUTROABS  --   --  3.9 2.7  HGB 10.2* 11.4* 10.8* 10.4*  HCT 33.8* 37.1 35.9* 34.4*  MCV 98.3 96.6 96.8 98.0  PLT 53* 150 162 204   Cardiac Enzymes: No results for input(s): CKTOTAL, CKMB, CKMBINDEX, TROPONINI in the last 168 hours. BNP: BNP (last 3 results) Recent Labs    12/03/19 0329  BNP 1,275.7*    ProBNP (last 3 results) No results for input(s): PROBNP in the last 8760 hours.  CBG: Recent Labs  Lab 10/14/20 1544 10/14/20 1614 10/14/20 2111 10/15/20 0557 10/15/20 1129  GLUCAP 61* 84 150* 135* 177*       Signed:  Fayrene Helper MD.  [facility] 10/15/2020, 1:13 PM

## 2020-10-15 NOTE — Evaluation (Signed)
Physical Therapy Evaluation Patient Details Name: [name] Michael MRN: [id number] DOB: 1961/05/26 Today's Date: 10/15/2020   History of Present Illness  Patient is a 60 y/o female who presents on 10/14/20 s/p fall in parking lot after dialysis. Admitted with acute encephalopathy. PMH includes ESRD on HD, DM, CVA, HTN, depression.  Clinical Impression  Patient presents with generalized weakness, impaired cognition, impaired balance and impaired mobility s/p above. Pt is from [facility] and reports using rollator for ambulation as well as w/c; needs assist for ADLs. Today, pt requires mod A for bed mobility, MIn-Mod A for transfers and standing due to posterior bias. Performed multiple transfers to/from chair and BSC.  Noted to have slow processing, decreased initiation and requires frequent multi- modal cues/repetition to perform tasks. Pt is a high fall risk. Not sure of cognitive baseline. Would benefit from return to SNF to maximize independence and mobility.    Follow Up Recommendations SNF;Supervision/Assistance - 24 hour    Equipment Recommendations  None recommended by PT    Recommendations for Other Services       Precautions / Restrictions Precautions Precautions: Fall Restrictions Weight Bearing Restrictions: No      Mobility  Bed Mobility Overal bed mobility: Needs Assistance Bed Mobility: Supine to Sit     Supine to sit: Mod assist;HOB elevated     General bed mobility comments: requires verbal and tactile cues/facilitation to initiate getting to EOB , increased time. Performed x2 as pt with LOB back onto bed when eating.    Transfers Overall transfer level: Needs assistance Equipment used: Rolling walker (2 wheeled) Transfers: Sit to/from [facility] Sit to Stand: Min assist;Mod assist Stand pivot transfers: Mod assist;Min assist       General transfer comment: Stood from EOB x1, SPT bed to chair x1, SPT chair to/from [facility] x1 with use of RW  for support; posterior bias throughout needing Min-Mod A for balance.  Ambulation/Gait Ambulation/Gait assistance: Min assist Gait Distance (Feet): 4 Feet Assistive device: Rolling walker (2 wheeled) Gait Pattern/deviations: Leaning posteriorly Gait velocity: decreased   General Gait Details: Able to take a few steps to get to chair with Min A for balance due to posterior lean.  Stairs            Wheelchair Mobility    Modified Rankin (Stroke Patients Only)       Balance Overall balance assessment: Needs assistance Sitting-balance support: Feet unsupported;No upper extremity supported Sitting balance-Leahy Scale: Fair Sitting balance - Comments: LOB onto bed when eating applesauce, Min guard assist otherwise.   Standing balance support: During functional activity Standing balance-Leahy Scale: Poor Standing balance comment: Requires UE support in standing; total A for pericare                             Pertinent Vitals/Pain Pain Assessment: Faces Faces Pain Scale: Hurts little more Pain Location: right knee with movement Pain Descriptors / Indicators: Sore;Tender Pain Intervention(s): Monitored during session;Repositioned    Home Living Family/patient expects to be discharged to:: Skilled nursing facility                 Additional Comments: From [employer]    Prior Function Level of Independence: Needs assistance   Gait / Transfers Assistance Needed: walks with rollator and uses w/c for longer distances  ADL's / Homemaking Assistance Needed: assist with bathing and dressing  Comments: pt reports she was working with therapies at  SNF     Hand Dominance   Dominant Hand: Right    Extremity/Trunk Assessment   Upper Extremity Assessment Upper Extremity Assessment: Generalized weakness    Lower Extremity Assessment Lower Extremity Assessment: Generalized weakness    Cervical / Trunk Assessment Cervical / Trunk Assessment: Normal   Communication   Communication: No difficulties  Cognition Arousal/Alertness: Awake/alert Behavior During Therapy: Flat affect Overall Cognitive Status: No family/caregiver present to determine baseline cognitive functioning                                 General Comments: Oriented to place, month/year and situation however pt reports "someone told me I fell." Slow processing, requires repetition to follow simple commands. Unaware of incontinence in bed. Poor initiation.      General Comments      Exercises     Assessment/Plan    PT Assessment Patient needs continued PT services  PT Problem List Decreased strength;Decreased activity tolerance;Decreased balance;Decreased mobility;Decreased cognition;Decreased knowledge of use of DME;Decreased safety awareness;Decreased knowledge of precautions;Decreased skin integrity;Pain       PT Treatment Interventions DME instruction;Gait training;Functional mobility training;Therapeutic activities;Therapeutic exercise;Balance training;Cognitive remediation;Patient/family education    PT Goals (Current goals can be found in the Care Plan section)  Acute Rehab PT Goals Patient Stated Goal: to get breakfast PT Goal Formulation: With patient Time For Goal Achievement: 10/29/20 Potential to Achieve Goals: Good    Frequency Min 2X/week   Barriers to discharge        Co-evaluation               AM-PAC PT "6 Clicks" Mobility  Outcome Measure Help needed turning from your back to your side while in a flat bed without using bedrails?: A Little Help needed moving from lying on your back to sitting on the side of a flat bed without using bedrails?: A Lot Help needed moving to and from a bed to a chair (including a wheelchair)?: A Lot Help needed standing up from a chair using your arms (e.g., wheelchair or bedside chair)?: A Little Help needed to walk in hospital room?: A Little Help needed climbing 3-5 steps with a  railing? : Total 6 Click Score: 14    End of Session Equipment Utilized During Treatment: Gait belt Activity Tolerance: Patient tolerated treatment well Patient left: in chair;with call bell/phone within reach;with chair alarm set;Other (comment) (with EEG tech in room) Nurse Communication: Mobility status;Other (comment) (used BSC) PT Visit Diagnosis: Unsteadiness on feet (R26.81);History of falling (Z91.81);Muscle weakness (generalized) (M62.81)    Time: [id number] PT Time Calculation (min) (ACUTE ONLY): 24 min   Charges:   PT Evaluation $PT Eval Moderate Complexity: 1 Mod PT Treatments $Therapeutic Activity: 8-22 mins        Marisa Severin, PT, DPT Acute Rehabilitation Services Pager [phone] Office [phone]      Marguarite Arbour A Sabra Heck 10/15/2020, 1:43 PM

## 2020-10-15 NOTE — Consult Note (Addendum)
[name] Renal Consultation Note    Indication for Consultation:  Management of ESRD/hemodialysis, anemia, hypertension/volume, and secondary hyperparathyroidism. PCP:  HPI: [name] Michael is a 60 y.o. female with PMH significant for ESRD on HD, HTN, CHF, T2DM, HLD, and stroke who has been admitted for altered mental status.   [name] Michael presented to the ED 4/23 after an unwitnessed fall in the parking lot at the dialysis center. ED evaluation found minor injuries that were addressed. CT head was negative for acute intracranial abnormality, but patient displayed persistent altered mental status that appeared worse than baseline. Inpatient workup thus far has revealed normal Vit B12, nonreactive RPR, normal ammonia & folate. Pending tests include UA, urine culture, and EEG.  On evaluation this morning, the patient reports feeling "ok". She c/o of nausea but denies any other problems. She reports HD on Saturday went well, although tx was stopped a little bit early due to some muscle cramping.    Past Medical History:  Diagnosis Date   Ambulates with cane    Constipation    Depression    ESRD (end stage renal disease) ([place])    a. TTS Dialysis.   GERD (gastroesophageal reflux disease)    History of stress test    a. 01/2003 MV: EF 74%, no ischemia/infarct.   Hyperlipidemia    Hypertension    Osteoporosis    Stroke [facility]) 04-01-11   left frontal subcortical, saw Dr. [name]    Syncope 11/2019   TIA (transient ischemic attack) 03-12-11   Tobacco abuse    Tricuspid regurgitation    a. 05/2016 Echo: EF 65-70%, Gr2DD, mild MR, nl RV fxn, Triv TR, PASP 54mHg; b. 05/2018 Echo: EF 60-65%, Gr2DD, mild MR/TR, RVSP/PASP 321mg; c. 11/2019 Echo: EF 55-60%, no rwma, mild-mod MR, Sev TR w/ RV dilatation (CTA chest neg for PE).   Type II diabetes mellitus ([facility] [place]   Vitamin D deficiency    Past Surgical History:  Procedure Laterality Date   AV FISTULA PLACEMENT Left 02/21/2019    Procedure: BRACHIOCEPHALIC ARTERIOVENOUS (AV) FISTULA CREATION;  Surgeon: ClMarty HeckMD;  [facility] Service: Vascular;  Laterality: Left;   [place] 04/11/2019   Procedure: SECOND STAGE BASILIC VEIN TRANSPOSITION LEFT ARM;  Surgeon: ClMarty HeckMD;  [place] [facility];  Laterality: Left;   IR FLUORO GUIDE CV LINE RIGHT  04/29/2019   IR [place] VASC ACCESS RIGHT  04/29/2019   OPEN REDUCTION INTERNAL FIXATION (ORIF) DISTAL RADIAL FRACTURE Left 01/28/2016   Procedure: OPEN REDUCTION INTERNAL FIXATION (ORIF) DISTAL RADIAL FRACTURE;  Surgeon: FrIran PlanasMD;  [facility] Service: Orthopedics;  Laterality: Left;   REC[place] RIGHT HEART CATH N/A 12/05/2019   Procedure: RIGHT HEART CATH;  Surgeon: EnNelva BushMD;  [facility];  Service: Cardiovascular;  Laterality: N/A;   TONSILLECTOMY AND ADENOIDECTOMY     age 60 Family History  Problem Relation Age of Onset   Stroke Mother    Diabetes Mother    Kidney failure Mother    Heart failure Mother    Stroke Father    Cancer Sister        Breast- [id number]'s Social History:  reports that she has been smoking cigarettes. She has a 9.60 pack-year smoking history. She has never used smokeless tobacco. She reports that she does not drink alcohol and does  not use drugs.  ROS: As per HPI otherwise negative.  Review of Systems: Gen: Denies any fever, chills, fatigue, weakness, malaise, weight loss. HEENT: Denies blurred vision, sore throat or epistaxis. No sinusitis.   CV: Denies chest pain, angina, palpitations, orthopnea, PND, peripheral edema, and claudication. Resp: Denies dyspnea at rest, dyspnea with exercise, cough, sputum, or wheezing. GI: Endorses nausea; denies vomiting, abdominal pain, constipation or diarrhea. GU: Denies dysuria or hematuria. MS: Denies joint pain, muscle pain or cramps.  Derm: Denies rashes,  itching, or ulcerations. Psych: Denies depression and anxiety. Heme: Denies bruising, bleeding, and enlarged lymph nodes. Neuro: No headache, dizziness, or weakness. Endocrine: No hypoglycemia or thyroid disease.  Physical Exam: Vitals:   10/15/20 0104 10/15/20 0428 10/15/20 0500 10/15/20 0727  BP: (!) 167/84 (!) 177/87 (!) 170/82 (!) 173/87  Pulse: 97 100  (!) 101  Resp: '17 18  16  '$ Temp: 98.4 F (36.9 C) 98.2 F (36.8 C)  97.9 F (36.6 C)  TempSrc: Oral Oral    SpO2: 100% 96%    Weight: 40.2 kg     Height:         General: Well developed, well nourished, in no acute distress. Head: Normocephalic, atraumatic, sclera non-icteric, mucus membranes are moist. Neck: Supple without lymphadenopathy/masses. JVD not elevated. Lungs: Clear bilaterally to auscultation without wheezes, rales, or rhonchi. Breathing is unlabored. Heart: RRR with normal S1, S2. No murmurs, rubs, or gallops appreciated. Abdomen: Soft, non-tender, non-distended with normoactive bowel sounds. No rebound/guarding. No obvious abdominal masses. Musculoskeletal:  Strength and tone appear normal for age. Lower extremities: No edema or ischemic changes, no open wounds. Neuro: Alert and oriented X 3. Moves all extremities spontaneously. Psych:  Responds to questions appropriately with a normal affect. Dialysis Access: Right upper arm AV fistula; palpable thrill and bruit present.  Allergies  Allergen Reactions   Hydrocodone Nausea And Vomiting and Other (See Comments)    "Allergic," per [facility]   Prior to Admission medications   Medication Sig Start Date End Date Taking? Authorizing Provider  Amino Acids-Protein Hydrolys (FEEDING SUPPLEMENT, PRO-STAT SUGAR FREE 64,) LIQD Take 30 mLs by mouth in the morning. WILD CHERRY FLAVOR   Yes [provider]  amLODipine (NORVASC) 5 MG tablet Take 1 tablet (5 mg total) by mouth daily for 7 days. 10/09/20 10/16/20 Yes Pahwani, Einar Grad, MD  aspirin 81 MG chewable tablet Chew 81  mg by mouth daily.   Yes [provider]  atorvastatin (LIPITOR) 40 MG tablet Take 40 mg by mouth at bedtime.    Yes [provider]  carvedilol (COREG) 6.25 MG tablet Take 1 tablet (6.25 mg total) by mouth 2 (two) times daily with a meal. 10/09/20 11/08/20 Yes Pahwani, Einar Grad, MD  ciprofloxacin (CIPRO) 250 MG tablet Take 1 tablet (250 mg total) by mouth daily with breakfast for 7 days. 10/09/20 10/16/20 Yes Pahwani, Einar Grad, MD  cloNIDine (CATAPRES) 0.1 MG tablet Take 0.1 mg by mouth every 8 (eight) hours as needed (BP>160/90).   Yes [provider]  FLUoxetine (PROZAC) 20 MG tablet Take 20 mg by mouth daily.   Yes [provider]  lactose free nutrition (BOOST PLUS) LIQD Take 237 mLs by mouth 3 (three) times daily with meals.   Yes [provider]  metroNIDAZOLE (FLAGYL) 500 MG tablet Take 1 tablet (500 mg total) by mouth 3 (three) times daily for 7 days. 10/09/20 10/16/20 Yes Pahwani, Einar Grad, MD  midodrine (PROAMATINE) 10 MG tablet Take 10 mg by mouth See admin  instructions. Take 1 tablet (10 mg) by mouth prior to dialysis on Tuesday, Thursday, Saturday   Yes [provider]  Multiple Vitamin (MULTIVITAMIN WITH MINERALS) TABS tablet Take 1 tablet by mouth daily.   Yes [provider]  omega-3 acid ethyl esters (LOVAZA) 1 g capsule Take 2 capsules by mouth 2 (two) times daily. 05/28/20  Yes [provider]  omeprazole (PRILOSEC) 40 MG capsule Take 40 mg by mouth daily before breakfast.    Yes [provider]  polyethylene glycol powder (GLYCOLAX/MIRALAX) 17 GM/SCOOP powder Take 17 g by mouth See admin instructions. Mix 17 grams of powder into 8 ounces of fluid, stir, and drink (by mouth) once a day   Yes [provider]  SACCHAROMYCES BOULARDII PO Take 1 capsule by mouth 2 (two) times daily.   Yes [provider]  senna-docusate (SENOKOT-S) 8.6-50 MG tablet Take 1 tablet by mouth at bedtime as needed for mild  constipation. 06/11/18  Yes Regalado, [name], MD  sevelamer carbonate (RENVELA) 800 MG tablet Take 800 mg by mouth 3 (three) times daily with meals.   Yes [provider]  sodium bicarbonate 650 MG tablet Take 1,300 mg by mouth 2 (two) times daily.   Yes [provider]  famotidine (PEPCID) 10 MG tablet Take 10 mg by mouth in the morning. Patient not taking: Reported on 10/13/2020    [provider]  HUMALOG 100 UNIT/ML injection Inject 0.05 mLs (5 Units total) into the skin 3 (three) times daily with meals. Patient not taking: No sig reported 08/23/20   Madalyn Rob, MD  Vitamin D, Ergocalciferol, (DRISDOL) 50000 units CAPS capsule Take 1 capsule (50,000 Units total) by mouth every 7 (seven) days. Patient not taking: No sig reported 11/21/16   Jonetta Osgood, MD   Current Facility-Administered Medications  Medication Dose Route Frequency Provider Last Rate Last Admin   (feeding supplement) PROSource Plus liquid 30 mL  30 mL Oral Daily Opyd, Ilene Qua, MD   30 mL at 10/14/20 1121   acetaminophen (TYLENOL) tablet 650 mg  650 mg Oral Q6H PRN Opyd, Ilene Qua, MD       Or   acetaminophen (TYLENOL) suppository 650 mg  650 mg Rectal Q6H PRN Opyd, Ilene Qua, MD       amLODipine (NORVASC) tablet 5 mg  5 mg Oral Daily Opyd, Ilene Qua, MD   5 mg at 10/14/20 1120   aspirin chewable tablet 81 mg  81 mg Oral Daily Opyd, Ilene Qua, MD   81 mg at 10/14/20 1120   atorvastatin (LIPITOR) tablet 40 mg  40 mg Oral QHS Opyd, Ilene Qua, MD   40 mg at 10/14/20 2158   carvedilol (COREG) tablet 6.25 mg  6.25 mg Oral BID WC Opyd, Ilene Qua, MD   6.25 mg at 10/14/20 1634   Chlorhexidine Gluconate Cloth 2 % PADS 6 each  6 each Topical Daily Elodia [name]., MD   6 each at 10/14/20 1120   famotidine (PEPCID) tablet 10 mg  10 mg Oral Daily Opyd, Ilene Qua, MD   10 mg at 10/14/20 1120   heparin injection 5,000 Units  5,000 Units Subcutaneous Q8H Opyd, Ilene Qua, MD   5,000 Units at 10/15/20  0511   levothyroxine (SYNTHROID) tablet 50 mcg  50 mcg Oral Q0600 Elodia [name]., MD   50 mcg at 10/15/20 0512   [START ON 10/16/2020] midodrine (PROAMATINE) tablet 10 mg  10 mg Oral Q T,Th,Sa-HD Opyd,  Ilene Qua, MD       pantoprazole (PROTONIX) EC tablet 40 mg  40 mg Oral Daily Opyd, Ilene Qua, MD   40 mg at 10/14/20 1120   senna-docusate (Senokot-S) tablet 1 tablet  1 tablet Oral QHS PRN Opyd, Ilene Qua, MD       sevelamer carbonate (RENVELA) tablet 800 mg  800 mg Oral TID WC Opyd, Ilene Qua, MD   800 mg at 10/14/20 1634   thiamine tablet 100 mg  100 mg Oral Daily Elodia [name]., MD   100 mg at 10/14/20 1634   Labs: Basic Metabolic Panel: Recent Labs  Lab 10/13/20 1802 10/14/20 0200 10/15/20 0509  NA 137 139 137  K 4.0 4.0 4.6  CL 100 101 102  CO2 32 32 27  GLUCOSE 101* 140* 128*  BUN <5* 6 16  CREATININE 1.19* 1.74* 3.51*  CALCIUM 8.3* 8.6* 9.0  PHOS  --   --  3.0   Liver Function Tests: Recent Labs  Lab 10/09/20 0242 10/13/20 1802 10/15/20 0509  AST 44* 34 30  ALT 57* 33 24  ALKPHOS 71 86 85  BILITOT 1.4* 0.9 0.9  PROT 4.8* 5.8* 5.8*  ALBUMIN 2.1* 2.6* 2.5*   No results for input(s): LIPASE, AMYLASE in the last 168 hours. Recent Labs  Lab 10/13/20 2053  AMMONIA 21   CBC: Recent Labs  Lab 10/09/20 0242 10/13/20 1802 10/14/20 0200 10/15/20 0509  WBC 4.5 3.7* 5.8 4.6  NEUTROABS  --   --  3.9 2.7  HGB 10.2* 11.4* 10.8* 10.4*  HCT 33.8* 37.1 35.9* 34.4*  MCV 98.3 96.6 96.8 98.0  PLT 53* 150 162 204   Cardiac Enzymes: No results for input(s): CKTOTAL, CKMB, CKMBINDEX, TROPONINI in the last 168 hours. CBG: Recent Labs  Lab 10/14/20 1227 10/14/20 1544 10/14/20 1614 10/14/20 2111 10/15/20 0557  GLUCAP 163* 61* 84 150* 135*   Iron Studies: No results for input(s): IRON, TIBC, TRANSFERRIN, FERRITIN in the last 72 hours. Studies/Results: CT Head Wo Contrast  Result Date: 10/13/2020 CLINICAL DATA:  Head trauma, mod-severe EXAM: CT HEAD  WITHOUT CONTRAST TECHNIQUE: Contiguous axial images were obtained from the base of the skull through the vertex without intravenous contrast. COMPARISON:  Head CT 8 days ago 10/05/2020 FINDINGS: Brain: No intracranial hemorrhage, mass effect, or midline shift. No hydrocephalus. The basilar cisterns are patent. Stable degree of atrophy and chronic small vessel ischemia. Again seen multiple remote lacunar infarct in the bilateral basal ganglia and thalami. Slight ex vacuo dilatation of the left lateral ventricle, stable. No evidence of territorial infarct or acute ischemia. No extra-axial or intracranial fluid collection. Vascular: Atherosclerosis of skullbase vasculature without hyperdense vessel or abnormal calcification. Skull: Temporal bone CT obtained to assess the temporal bones, reported separately. No evidence of skull fracture. Sinuses/Orbits: No acute findings. Other: Right postauricular soft tissue edema, assessed on concurrent temporal bone CT, reported separately. IMPRESSION: 1. No acute intracranial abnormality. No skull fracture. 2. Stable atrophy, chronic small vessel ischemia, and remote lacunar infarcts. 3. Right postauricular soft tissue edema, assessed on concurrent temporal bone CT, reported separately. Electronically Signed   By: Keith Rake M.D.   On: 10/13/2020 18:54   CT Cervical Spine Wo Contrast  Result Date: 10/13/2020 CLINICAL DATA:  Polytrauma, critical, head/C-spine injury suspected EXAM: CT CERVICAL SPINE WITHOUT CONTRAST TECHNIQUE: Multidetector CT imaging of the cervical spine was performed without intravenous contrast. Multiplanar CT image reconstructions were also generated. COMPARISON:  Cervical radiograph 02/29/2020 FINDINGS: Mild motion  artifact limits assessment. Alignment: Straightening of normal lordosis. No traumatic subluxation. Skull base and vertebrae: No acute fracture. Vertebral body heights are maintained. The dens and skull base are intact. Soft tissues and  spinal canal: No prevertebral fluid or swelling. No visible canal hematoma. Disc levels: Mild degenerative disc disease most prominent at C4-C5. Occasional facet hypertrophy. Upper chest: No acute or unexpected findings. Patulous upper esophagus. Other: None. IMPRESSION: Mild degenerative change in the cervical spine without acute fracture or subluxation. Electronically Signed   By: Keith Rake M.D.   On: 10/13/2020 18:58   DG Pelvis Portable  Result Date: 10/13/2020 CLINICAL DATA:  Knee contusions after a fall. EXAM: PORTABLE PELVIS 1-2 VIEWS COMPARISON:  None. FINDINGS: There is no evidence of pelvic fracture or diastasis. No pelvic bone lesions are seen. Mild degenerative changes in the lower lumbar spine and hips. IMPRESSION: No acute bony abnormalities. Electronically Signed   By: Lucienne Capers M.D.   On: 10/13/2020 19:04   DG Chest Port 1 View  Result Date: 10/13/2020 CLINICAL DATA:  Fall. EXAM: PORTABLE CHEST 1 VIEW COMPARISON:  10/05/2020. FINDINGS: Stable heart size and mediastinal contours. Aortic atherosclerosis. No pneumothorax, large pleural effusion or focal airspace disease. No acute osseous abnormalities are seen. IMPRESSION: No acute findings. Electronically Signed   By: Keith Rake M.D.   On: 10/13/2020 19:04   DG Knee Complete 4 Views Left  Result Date: 10/13/2020 CLINICAL DATA:  Knee contusions after a fall. EXAM: LEFT KNEE - COMPLETE 4+ VIEW COMPARISON:  None. FINDINGS: No evidence of fracture, dislocation, or joint effusion. No evidence of arthropathy or other focal bone abnormality. Soft tissues are unremarkable. IMPRESSION: Negative. Electronically Signed   By: Lucienne Capers M.D.   On: 10/13/2020 19:04   DG Knee Complete 4 Views Right  Result Date: 10/13/2020 CLINICAL DATA:  Knee contusions after a fall. EXAM: RIGHT KNEE - COMPLETE 4+ VIEW COMPARISON:  None. FINDINGS: Mild degenerative changes with mild medial compartment narrowing. No evidence of acute  fracture or dislocation. No focal bone lesion or bone destruction. No effusions. Soft tissues are unremarkable. IMPRESSION: Mild degenerative changes. No acute bony abnormalities. Electronically Signed   By: Lucienne Capers M.D.   On: 10/13/2020 19:05   DG Hand Complete Right  Result Date: 10/13/2020 CLINICAL DATA:  Contusions after a fall. EXAM: RIGHT HAND - COMPLETE 3+ VIEW COMPARISON:  Right wrist 12/03/2019. FINDINGS: Diffuse bone demineralization. Deformity of the distal radial metaphysis without cortical changes likely represents old fracture deformity. There is an ununited ossicle at the ulnar styloid process likely representing an old injury as well. These correspond to acute injuries seen on prior wrist radiographs from 12/03/2019. degenerative changes in the interphalangeal joints. Mild dorsal soft tissue swelling. No acute fracture or dislocation identified. IMPRESSION: Old fracture deformities of the distal radial metaphysis and ulnar styloid process. No acute fractures identified. Electronically Signed   By: Lucienne Capers M.D.   On: 10/13/2020 19:07   CT Temporal Bones Wo Contrast  Result Date: 10/13/2020 CLINICAL DATA:  Right postauricular swelling. EXAM: CT TEMPORAL BONES WITHOUT CONTRAST TECHNIQUE: Axial and coronal plane CT imaging of the petrous temporal bones was performed with thin-collimation image reconstruction. No intravenous contrast was administered. Multiplanar CT image reconstructions were also generated. COMPARISON:  None. FINDINGS: RIGHT: --Pinna and external auditory canal: Normal. --Ossicular chain: Normal. No erosion or dislocation. --Tympanic membrane: Normal. --Middle ear: Normal. --Epitympanum: The Prussak space is clear. The scutum is sharp. Tegmen tympani is intact. --Cochlea, vestibule, vestibular  aqueduct and semicircular canals: Normal. No evidence of canal dehiscence or otospongiosis. --Internal auditory canal: Normal. No widening of the porus acusticus. --Facial  nerve: No focal abnormality along the course of the facial nerve. --Cerebellopontine angle: Normal. --Petrous Apex: Normal. --Mastoids: Normal. --Carotid canal: Normal position. LEFT: --Pinna and external auditory canal: Normal. --Ossicular chain: Normal. No erosion or dislocation. --Tympanic membrane: Normal. --Middle ear: Normal. --Epitympanum: The Prussak space is clear. The scutum is sharp. Tegmen tympani is intact. --Cochlea, vestibule, vestibular aqueduct and semicircular canals: Normal. No evidence of canal dehiscence or otospongiosis. --Internal auditory canal: Normal. No widening of the porus acusticus. --Facial nerve: No focal abnormality along the course of the facial nerve. --Cerebellopontine angle: Normal. --Petrous Apex: Normal. --Mastoids: Normal. --Carotid canal: Normal position. OTHER: --Visualized intracranial: Normal. --Visualized paranasal sinuses: Normal. --Nasopharynx: Clear. --Temporomandibular joints: Normal. --Visualized extracranial soft tissues: Normal. IMPRESSION: Normal CT of the temporal bones. Electronically Signed   By: Ulyses Jarred M.D.   On: 10/13/2020 19:25    Dialysis Orders: Center: [place], TTS, 3.75 hr, BFR 350, DFR 500, 3K/2.5Ca, EDW 40.5 Left upper arm AV fistula Mircera 50 mcg q2wk, last dose 4/12 Hectoral 5 mcg tiw  Assessment/Plan:  Altered mental status: CT head negative for acute findings.  EEG being completed currently.  Do not suspect dialysis dysequilibrium given BUN/regular (although shortened treatments of) dialysis. Currently being managed by hospitalist team.   ESRD: Per outpatient schedule, planning next tx tomorrow, Tues 4/26.  Use 3K dialysate.    Hypertension/volume: BP has ranged from 122/52 - 177/87. Cont outpt amlodipine and coreg; UF with HD to EDW - appears she hasn't achieved her new EDW outpt but this may be due to shortened treatments.    Anemia of ESRD: Hgb 10.4. Will be due for dose of ESA tomorrow - if remains admitted will give aranesp  here for outpt mircera.    Metabolic bone disease: Calcium 9.0, phos 3.0. Continue Renvela 800 mg tid.  PTH level 150 outpatient on 4/7. Nutrition:  Albumin 2.5. Give Prostat between meals daily.  I personally interviewed and examined the patient and agree with the assessment and plan by Ermelinda Das PA Student.    Plan HD tomorrow.    Jannifer Hick MD [facility] Pager [phone]

## 2020-11-08 ENCOUNTER — Other Ambulatory Visit: Payer: Self-pay

## 2020-11-08 ENCOUNTER — Ambulatory Visit (INDEPENDENT_AMBULATORY_CARE_PROVIDER_SITE_OTHER): Payer: Medicare Other | Admitting: Pulmonary Disease

## 2020-11-08 ENCOUNTER — Encounter: Payer: Self-pay | Admitting: Pulmonary Disease

## 2020-11-08 VITALS — BP 122/72 | HR 89 | Temp 98.2°F | Ht <= 58 in | Wt 94.0 lb

## 2020-11-08 DIAGNOSIS — J9601 Acute respiratory failure with hypoxia: Secondary | ICD-10-CM

## 2020-11-08 NOTE — Patient Instructions (Signed)
You do not require supplemental oxygen at rest or with walking.   Please give [name] a call if your breathing changes and need to be seen again.

## 2020-11-08 NOTE — Progress Notes (Signed)
Synopsis: Referred in May 2022 for shortness of breath and Hypoxia  Subjective:   PATIENT ID: [name] Michael GENDER: female DOB: 1960/11/13, MRN: [id number]   HPI  Chief Complaint  Patient presents with  . Consult    Hypoxia-cough-clear, ?sob, feels "full" in stomach    [name] Michael is a 60 year old woman, daily smoker with ESRD on HD, GERD, severe tricuspid regurge and stroke who is referred to pulmonary clinic for hypoxia.   She was started on supplemental oxygen earlier this year from one of her multiple hospital admissions for altered mentation and frequent falls.   She is on 2L at rest and 4L with ambulation. She is currently not wearing oxygen with an SpO2 of 96% at rest. She denies any issues with her breathing. She denies cough, wheezing or chest tightness. She denies lower extremity edema. She only complains of abdominal bloating and fullness.   She is a daily smoker, smoking 8 cigarettes daily.   Past Medical History:  Diagnosis Date  . Ambulates with cane   . Constipation   . Depression   . ESRD (end stage renal disease) ([place])    a. TTS Dialysis.  Marland Kitchen GERD (gastroesophageal reflux disease)   . History of stress test    a. 01/2003 MV: EF 74%, no ischemia/infarct.  . Hyperlipidemia   . Hypertension   . Osteoporosis   . Stroke [facility]) 04-01-11   left frontal subcortical, saw Dr. [name]   . Syncope 11/2019  . TIA (transient ischemic attack) 03-12-11  . Tobacco abuse   . Tricuspid regurgitation    a. 05/2016 Echo: EF 65-70%, Gr2DD, mild MR, nl RV fxn, Triv TR, PASP 36mHg; b. 05/2018 Echo: EF 60-65%, Gr2DD, mild MR/TR, RVSP/PASP 347mg; c. 11/2019 Echo: EF 55-60%, no rwma, mild-mod MR, Sev TR w/ RV dilatation (CTA chest neg for PE).  . [place]  . Vitamin D deficiency      Family History  Problem Relation Age of Onset  . Stroke Mother   . Diabetes Mother   . Kidney failure Mother   . Heart failure Mother   . Stroke Father   . Cancer Sister         Breast- 5020's   Social History   Socioeconomic History  . Marital status: Single    Spouse name: Not on file  . Number of children: Not on file  . Years of education: Not on file  . Highest education level: Not on file  Occupational History  . Not on file  Tobacco Use  . Smoking status: Current Every Day Smoker    Packs/day: 0.30    Years: 32.00    Pack years: 9.60    Types: Cigarettes    Last attempt to quit: 09/23/2012    Years since quitting: 8.1  . Smokeless tobacco: Never Used  . Tobacco comment: 8 cigarettes/day  Vaping Use  . Vaping Use: Never used  Substance and Sexual Activity  . Alcohol use: No  . Drug use: No  . Sexual activity: Not Currently    Birth control/protection: Post-menopausal  Other Topics Concern  . Not on file  Social History Narrative   [facility]    Right Handed   Drinks 2-4 cups caffeine daily      Social Determinants of Health   Financial Resource Strain: Not on file  Food Insecurity: Not on file  Transportation Needs: Not on file  Physical Activity: Not on file  Stress: Not on file  Social Connections: Not on file  Intimate Partner Violence: Not on file     Allergies  Allergen Reactions  . Hydrocodone Nausea And Vomiting and Other (See Comments)    "Allergic," per [facility]     Outpatient Medications Prior to Visit  Medication Sig Dispense Refill  . Amino Acids-Protein Hydrolys (FEEDING SUPPLEMENT, PRO-STAT SUGAR FREE 64,) LIQD Take 30 mLs by mouth in the morning. WILD CHERRY FLAVOR    . amLODipine (NORVASC) 5 MG tablet Take 1 tablet (5 mg total) by mouth daily. 30 tablet 0  . aspirin 81 MG chewable tablet Chew 81 mg by mouth daily.    Marland Kitchen atorvastatin (LIPITOR) 40 MG tablet Take 40 mg by mouth at bedtime.     . carvedilol (COREG) 6.25 MG tablet Take 1 tablet (6.25 mg total) by mouth 2 (two) times daily with a meal. 60 tablet 0  . cloNIDine (CATAPRES) 0.1 MG tablet Take 0.1 mg by mouth every 8  (eight) hours as needed (BP>160/90).    . famotidine (PEPCID) 10 MG tablet Take 10 mg by mouth in the morning.    . lactose free nutrition (BOOST PLUS) LIQD Take 237 mLs by mouth 3 (three) times daily with meals.    Marland Kitchen levothyroxine (SYNTHROID) 50 MCG tablet Take 1 tablet (50 mcg total) by mouth daily at 6 (six) AM. 30 tablet 0  . midodrine (PROAMATINE) 10 MG tablet Take 10 mg by mouth See admin instructions. Take 1 tablet (10 mg) by mouth prior to dialysis on Tuesday, Thursday, Saturday    . Multiple Vitamin (MULTIVITAMIN WITH MINERALS) TABS tablet Take 1 tablet by mouth daily.    Marland Kitchen omega-3 acid ethyl esters (LOVAZA) 1 g capsule Take 2 capsules by mouth 2 (two) times daily.    Marland Kitchen omeprazole (PRILOSEC) 40 MG capsule Take 40 mg by mouth daily before breakfast.     . polyethylene glycol powder (GLYCOLAX/MIRALAX) 17 GM/SCOOP powder Take 17 g by mouth See admin instructions. Mix 17 grams of powder into 8 ounces of fluid, stir, and drink (by mouth) once a day    . SACCHAROMYCES BOULARDII PO Take 1 capsule by mouth 2 (two) times daily.    Marland Kitchen senna-docusate (SENOKOT-S) 8.6-50 MG tablet Take 1 tablet by mouth at bedtime as needed for mild constipation. 30 tablet 0  . sevelamer carbonate (RENVELA) 800 MG tablet Take 800 mg by mouth 3 (three) times daily with meals.    . sodium bicarbonate 650 MG tablet Take 1,300 mg by mouth 2 (two) times daily.    Marland Kitchen thiamine 100 MG tablet Take 1 tablet (100 mg total) by mouth daily. 30 tablet 0  . Vitamin D, Ergocalciferol, (DRISDOL) 50000 units CAPS capsule Take 1 capsule (50,000 Units total) by mouth every 7 (seven) days. 30 capsule 0   No facility-administered medications prior to visit.    Review of Systems  Constitutional: Negative for chills, fever, malaise/fatigue and weight loss.  HENT: Negative for congestion, sinus pain and sore throat.   Eyes: Negative.   Respiratory: Negative for cough, hemoptysis, sputum production, shortness of breath and wheezing.    Cardiovascular: Negative for chest pain, palpitations, orthopnea, claudication and leg swelling.  Gastrointestinal: Negative for abdominal pain, heartburn, nausea and vomiting.       Abdominal bloating  Genitourinary: Negative.   Musculoskeletal: Negative for joint pain and myalgias.  Skin: Negative for rash.  Neurological: Negative for weakness.  Endo/Heme/Allergies: Negative.  Psychiatric/Behavioral: Negative.       Objective:   Vitals:   11/08/20 1021  BP: 122/72  Pulse: 89  Temp: 98.2 F (36.8 C)  TempSrc: Temporal  SpO2: 96%  Weight: 94 lb (42.6 kg)  Height: '4\' 6"'$  (1.372 m)     Physical Exam Constitutional:      General: She is not in acute distress. HENT:     Head: Normocephalic and atraumatic.     Nose: Nose normal.     Mouth/Throat:     Mouth: Mucous membranes are moist.  Eyes:     General: No scleral icterus.    Conjunctiva/sclera: Conjunctivae normal.     Pupils: Pupils are equal, round, and reactive to light.  Cardiovascular:     Rate and Rhythm: Normal rate and regular rhythm.     Pulses: Normal pulses.     Heart sounds: Normal heart sounds. No murmur heard.   Pulmonary:     Effort: Pulmonary effort is normal.     Breath sounds: Normal breath sounds. No wheezing, rhonchi or rales.  Abdominal:     General: Bowel sounds are normal.     Palpations: Abdomen is soft.  Musculoskeletal:     Right lower leg: No edema.     Left lower leg: No edema.  Lymphadenopathy:     Cervical: No cervical adenopathy.  Skin:    General: Skin is warm and dry.  Neurological:     General: No focal deficit present.     Mental Status: She is alert.  Psychiatric:        Mood and Affect: Mood normal.        Behavior: Behavior normal.        Thought Content: Thought content normal.        Judgment: Judgment normal.    CBC    Component Value Date/Time   WBC 4.6 10/15/2020 0509   RBC 3.51 (L) 10/15/2020 0509   HGB 10.4 (L) 10/15/2020 0509   HCT 34.4 (L)  10/15/2020 0509   PLT 204 10/15/2020 0509   MCV 98.0 10/15/2020 0509   MCH 29.6 10/15/2020 0509   MCHC 30.2 10/15/2020 0509   RDW 17.8 (H) 10/15/2020 0509   LYMPHSABS 1.2 10/15/2020 0509   MONOABS 0.6 10/15/2020 0509   EOSABS 0.1 10/15/2020 0509   BASOSABS 0.1 10/15/2020 0509   BMP Latest Ref Rng & Units 10/15/2020 10/14/2020 10/13/2020  Glucose 70 - 99 mg/dL 128(H) 140(H) 101(H)  BUN 6 - 20 mg/dL 16 6 <5(L)  Creatinine 0.44 - 1.00 mg/dL 3.51(H) 1.74(H) 1.19(H)  Sodium 135 - 145 mmol/L 137 139 137  Potassium 3.5 - 5.1 mmol/L 4.6 4.0 4.0  Chloride 98 - 111 mmol/L 102 101 100  CO2 22 - 32 mmol/L 27 32 32  Calcium 8.9 - 10.3 mg/dL 9.0 8.6(L) 8.3(L)   Chest imaging: CXR 10/13/20 Stable heart size and mediastinal contours. Aortic atherosclerosis. No pneumothorax, large pleural effusion or focal airspace disease. No acute osseous abnormalities are seen.  PFT: No flowsheet data found.   Echo 10/05/20: 1. Left ventricular ejection fraction, by estimation, is 50 to 55%. The  left ventricle has low normal function. The left ventricle has no regional  wall motion abnormalities. Left ventricular diastolic parameters are  indeterminate. There is the  interventricular septum is flattened in systole and diastole, consistent  with right ventricular pressure and volume overload.  2. Right ventricular systolic function is mildly reduced. The right  ventricular size is severely enlarged.  3.  Left atrial size was moderately dilated.  4. Right atrial size was moderately dilated.  5. A small pericardial effusion is present.  6. The mitral valve is normal in structure. Mild to moderate mitral valve  regurgitation.  7. 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 Tricuspid valve  regurgitation is severe.  8. The aortic valve is tricuspid. Aortic valve regurgitation is not  visualized. No aortic stenosis is present.  9. The inferior vena cava is dilated in size with <50% respiratory   variability, suggesting right atrial pressure of 15 mmHg.   Abdominal US 10/13/20 1. Mild heterogeneous increased liver echotexture compatible with hepatic steatosis. 2. Nonvisualization of the gallbladder, likely due to decompressed state. 3. Limited study due to bowel gas. 4. Right pleural effusion.  Assessment & Plan:   Acute respiratory failure with hypoxia (HCC)  Discussion: Bayah Wingate is a 60 year old woman, daily smoker with ESRD on HD, GERD, severe tricuspid regurge and stroke who is referred to pulmonary clinic for hypoxia.  Her acute respiratory failure was likely related to volume status given her heart failure related to severe tricuspid regurge and ESRD. Her volume status appears to be well maintained on her current dialysis regimen.   On room air her SpO2 is 96% on room air and is 93% on simple walk after 1 lap (267f). She does not require supplemental oxygen at rest or with ambulation.   We discussed the importance of smoking cessation. She does not qualify for lung cancer screening based on her pack year history.   Patient can follow up as needed if she experiences changes in her breathing.  JFreda Jackson MD L[facility]neP[facility] & Critical Care Office: [phone]     Current Outpatient Medications:  .  Amino Acids-Protein Hydrolys (FEEDING SUPPLEMENT, PRO-STAT SUGAR FREE 64,) LIQD, Take 30 mLs by mouth in the morning. WILD CHERRY FLAVOR, Disp: , Rfl:  .  amLODipine (NORVASC) 5 MG tablet, Take 1 tablet (5 mg total) by mouth daily., Disp: 30 tablet, Rfl: 0 .  aspirin 81 MG chewable tablet, Chew 81 mg by mouth daily., Disp: , Rfl:  .  atorvastatin (LIPITOR) 40 MG tablet, Take 40 mg by mouth at bedtime. , Disp: , Rfl:  .  carvedilol (COREG) 6.25 MG tablet, Take 1 tablet (6.25 mg total) by mouth 2 (two) times daily with a meal., Disp: 60 tablet, Rfl: 0 .  cloNIDine (CATAPRES) 0.1 MG tablet, Take 0.1 mg by mouth every 8 (eight) hours as needed (BP>160/90).,  Disp: , Rfl:  .  famotidine (PEPCID) 10 MG tablet, Take 10 mg by mouth in the morning., Disp: , Rfl:  .  lactose free nutrition (BOOST PLUS) LIQD, Take 237 mLs by mouth 3 (three) times daily with meals., Disp: , Rfl:  .  levothyroxine (SYNTHROID) 50 MCG tablet, Take 1 tablet (50 mcg total) by mouth daily at 6 (six) AM., Disp: 30 tablet, Rfl: 0 .  midodrine (PROAMATINE) 10 MG tablet, Take 10 mg by mouth See admin instructions. Take 1 tablet (10 mg) by mouth prior to dialysis on Tuesday, Thursday, Saturday, Disp: , Rfl:  .  Multiple Vitamin (MULTIVITAMIN WITH MINERALS) TABS tablet, Take 1 tablet by mouth daily., Disp: , Rfl:  .  omega-3 acid ethyl esters (LOVAZA) 1 g capsule, Take 2 capsules by mouth 2 (two) times daily., Disp: , Rfl:  .  omeprazole (PRILOSEC) 40 MG capsule, Take 40 mg by mouth daily before breakfast. , Disp: , Rfl:  .  polyethylene glycol powder (GLYCOLAX/MIRALAX) 17 GM/SCOOP powder, Take 17 g by mouth See admin instructions. Mix 17 grams of powder into 8 ounces of fluid, stir, and drink (by mouth) once a day, Disp: , Rfl:  .  SACCHAROMYCES BOULARDII PO, Take 1 capsule by mouth 2 (two) times daily., Disp: , Rfl:  .  senna-docusate (SENOKOT-S) 8.6-50 MG tablet, Take 1 tablet by mouth at bedtime as needed for mild constipation., Disp: 30 tablet, Rfl: 0 .  sevelamer carbonate (RENVELA) 800 MG tablet, Take 800 mg by mouth 3 (three) times daily with meals., Disp: , Rfl:  .  sodium bicarbonate 650 MG tablet, Take 1,300 mg by mouth 2 (two) times daily., Disp: , Rfl:  .  thiamine 100 MG tablet, Take 1 tablet (100 mg total) by mouth daily., Disp: 30 tablet, Rfl: 0 .  Vitamin D, Ergocalciferol, (DRISDOL) 50000 units CAPS capsule, Take 1 capsule (50,000 Units total) by mouth every 7 (seven) days., Disp: 30 capsule, Rfl: 0

## 2020-11-09 ENCOUNTER — Non-Acute Institutional Stay: Payer: Medicare Other | Admitting: Hospice

## 2020-11-09 DIAGNOSIS — N185 Chronic kidney disease, stage 5: Secondary | ICD-10-CM

## 2020-11-09 DIAGNOSIS — Z992 Dependence on renal dialysis: Secondary | ICD-10-CM

## 2020-11-09 DIAGNOSIS — Z515 Encounter for palliative care: Secondary | ICD-10-CM

## 2020-11-09 DIAGNOSIS — N186 End stage renal disease: Secondary | ICD-10-CM

## 2020-11-09 DIAGNOSIS — R269 Unspecified abnormalities of gait and mobility: Secondary | ICD-10-CM

## 2020-11-09 NOTE — Progress Notes (Signed)
[place] Consult Note Telephone: [phone]  Fax: [phone]  PATIENT NAME: [name] Michael DOB: 12-20-1960 MRN: [id number]  PRIMARY CARE PROVIDER: Dr Felicita Gage Mammie Lorenzo NP  REFERRING PROVIDER:   Dr Felicita Gage Mammie Lorenzo NP   RESPONSIBLE PARTY: Self   Extended Emergency Contact Information Primary Emergency Contact: Rosalio Macadamia [place] Phone: [phone] Relation: Sister Secondary Emergency Contact: [place] Phone: [phone] Mobile Phone: [phone] Relation: Niece  CHIEF COMPLAIN: Follow up visit/Gait disturbance  Visit is to build trust and highlight Palliative Medicine as specialized medical care for people living with serious illness, aimed at facilitating better quality of life through symptoms relief, assisting with advance care plan and establishing goals of care.   RECOMMENDATIONS/PLAN:   Advance Care Planning/CODE STATUS: Patient affirmed she is a DO NOT RESUSCITATE.  GOALS OF CARE: Goals of care include to maximize quality of life and symptom management. She said she does not want hospice but will continue with dialysis as planned.   Symptom Management/Plan:  Gait disturbance: Improving, completed PT OT. Balance of rest and performance activity. Use of assistive device for support to help prevent  Nausea or vomiting: Continue Zofran as ordered.  KUB as needed, CBC BMP.  GI consult End-stage renal disease: Continue dialysis as scheduled.  Continue Renvela and renal diet. Hypotension: Managed with midodrine Constipation:Miralax daily. Palliative will continue to monitor for symptom management/decline and make recommendations as needed. Return6weeks or prn. Encouraged to call provider sooner with any concerns.   PPS: 50%  HOSPICE ELIGIBILITY/DIAGNOSIS: TBD  HISTORY OF PRESENT ILLNESS:  [name] Michael is a 60 y.o. female with multiple medical problems  including gait disturbance which is improving since she completed PT/OT. History  of ESRD on dialysis Tuesday Thursday and Saturday, CVA, CHF. History obtained from review of EMR, History obtained from review of EMR, discussion with primary team, family and/or patient. Records reviewed and summarized above. All 10 point systems reviewed and are negative except as documented in history of present illness above  Review and summarization of Epic records shows history from other than patient.   Palliative Care was asked to help address complex decision making in the context of advance care planning and goals of care clarification.   PPS: 50%  ROS  General: NAD, appropriately dressed Constitution: Denies fever/chills EYES: denies vision changes ENMT: denies Xerostomia,  dysphagia Cardiovascular: denies chest pain Pulmonary: denies  cough, denies dyspnea  Abdomen: endorses fair appetite, endorses occasional nausea, denies constipation or diarrhea GU: denies dysuria MSK:  endorses ROM limitations, no falls reported Skin: denies rashes/bruising Neurological: endorses weakness, denies pain, denies insomnia Psych: Endorses positive mood Heme/lymph/immuno: denies bruises, no abnormal bleeding   PHYSICAL EXAM  General: In no acute distress, appropriately dressed Cardiovascular: regular rate and rhythm; no edema in BLE Pulmonary: no cough, no increased work of breathing, normal respiratory effort Abdomen: soft, non tender, no guarding, positive bowel sounds in all quadrants GU:  no suprapubic tenderness Eyes: Normal lids, no discharge, sclera anicteric ENMT: Moist mucous membranes Musculoskeletal:  weakness Skin: no rash to visible skin, warm without cyanosis,  Psych: non-anxious affect Neurological: Weakness but otherwise non focal Heme/lymph/immuno: no bruises, no bleeding  PERTINENT MEDICATIONS:  Outpatient Encounter Medications as of 11/09/2020  Medication Sig  . Amino Acids-Protein  Hydrolys (FEEDING SUPPLEMENT, PRO-STAT SUGAR FREE 64,) LIQD Take 30 mLs by mouth in the morning. WILD CHERRY FLAVOR  . amLODipine (NORVASC) 5 MG tablet Take 1 tablet (5  mg total) by mouth daily.  Marland Kitchen aspirin 81 MG chewable tablet Chew 81 mg by mouth daily.  Marland Kitchen atorvastatin (LIPITOR) 40 MG tablet Take 40 mg by mouth at bedtime.   . carvedilol (COREG) 6.25 MG tablet Take 1 tablet (6.25 mg total) by mouth 2 (two) times daily with a meal.  . cloNIDine (CATAPRES) 0.1 MG tablet Take 0.1 mg by mouth every 8 (eight) hours as needed (BP>160/90).  . famotidine (PEPCID) 10 MG tablet Take 10 mg by mouth in the morning.  . lactose free nutrition (BOOST PLUS) LIQD Take 237 mLs by mouth 3 (three) times daily with meals.  Marland Kitchen levothyroxine (SYNTHROID) 50 MCG tablet Take 1 tablet (50 mcg total) by mouth daily at 6 (six) AM.  . midodrine (PROAMATINE) 10 MG tablet Take 10 mg by mouth See admin instructions. Take 1 tablet (10 mg) by mouth prior to dialysis on Tuesday, Thursday, Saturday  . Multiple Vitamin (MULTIVITAMIN WITH MINERALS) TABS tablet Take 1 tablet by mouth daily.  Marland Kitchen omega-3 acid ethyl esters (LOVAZA) 1 g capsule Take 2 capsules by mouth 2 (two) times daily.  Marland Kitchen omeprazole (PRILOSEC) 40 MG capsule Take 40 mg by mouth daily before breakfast.   . polyethylene glycol powder (GLYCOLAX/MIRALAX) 17 GM/SCOOP powder Take 17 g by mouth See admin instructions. Mix 17 grams of powder into 8 ounces of fluid, stir, and drink (by mouth) once a day  . SACCHAROMYCES BOULARDII PO Take 1 capsule by mouth 2 (two) times daily.  Marland Kitchen senna-docusate (SENOKOT-S) 8.6-50 MG tablet Take 1 tablet by mouth at bedtime as needed for mild constipation.  . sevelamer carbonate (RENVELA) 800 MG tablet Take 800 mg by mouth 3 (three) times daily with meals.  . sodium bicarbonate 650 MG tablet Take 1,300 mg by mouth 2 (two) times daily.  Marland Kitchen thiamine 100 MG tablet Take 1 tablet (100 mg total) by mouth daily.  . Vitamin D, Ergocalciferol, (DRISDOL)  50000 units CAPS capsule Take 1 capsule (50,000 Units total) by mouth every 7 (seven) days.   No facility-administered encounter medications on file as of 11/09/2020.    HOSPICE ELIGIBILITY/DIAGNOSIS: TBD  PAST MEDICAL HISTORY:  Past Medical History:  Diagnosis Date  . Ambulates with cane   . Constipation   . Depression   . ESRD (end stage renal disease) ([place])    a. TTS Dialysis.  Marland Kitchen GERD (gastroesophageal reflux disease)   . History of stress test    a. 01/2003 MV: EF 74%, no ischemia/infarct.  . Hyperlipidemia   . Hypertension   . Osteoporosis   . Stroke [facility]) 04-01-11   left frontal subcortical, saw Dr. [name]   . Syncope 11/2019  . TIA (transient ischemic attack) 03-12-11  . Tobacco abuse   . Tricuspid regurgitation    a. 05/2016 Echo: EF 65-70%, Gr2DD, mild MR, nl RV fxn, Triv TR, PASP 3mHg; b. 05/2018 Echo: EF 60-65%, Gr2DD, mild MR/TR, RVSP/PASP 312mg; c. 11/2019 Echo: EF 55-60%, no rwma, mild-mod MR, Sev TR w/ RV dilatation (CTA chest neg for PE).  . [place] II diabetes mellitus ([place]  . Vitamin D deficiency      SOCIAL HX: '@SOCX'$  Patient lives at SNF  for ongoing care  FAMILY HX:  Family History  Problem Relation Age of Onset  . Stroke Mother   . Diabetes Mother   . Kidney failure Mother   . Heart failure Mother   . Stroke Father   . Cancer Sister        Breast-  50's    Review lab tests/diagnostics No results for input(s): WBC, HGB, HCT, PLT, MCV in the last 168 hours. No results for input(s): NA, K, CL, CO2, BUN, CREATININE, GLUCOSE in the last 168 hours. CrCl cannot be calculated (Patient's most recent lab result is older than the maximum 21 days allowed.).  ALLERGIES:  Allergies  Allergen Reactions  . Hydrocodone Nausea And Vomiting and Other (See Comments)    "Allergic," per [facility]      I spent 50  minutes providing this consultation; this includes time spent with patient/family, chart review and documentation. More than 50% of the time in this  consultation was spent on counseling and coordinating communication   Thank you for the opportunity to participate in the care of [name] Michael Please call our office at [phone] if we can be of additional assistance.  Note: Portions of this note were generated with [employer]. Dictation errors may occur despite best attempts at proofreading.  Teodoro Spray, NP

## 2020-11-28 NOTE — Progress Notes (Deleted)
Patient ID: [name] Michael, female   DOB: 12-10-1960, 60 y.o.   MRN: [id number]   After hospitalization 4/23-4/25/2022  From discharge summary: Recommendations for Outpatient Follow-up:  1. Follow outpatient CBC/CMP 2. Follow mental status outpatient - avoid polypharmacy, adjust regimen as needed 3. Follow with neurology outpatient for her recurrent episodes of AMS 4. Follow pending B1 5. Needs follow up thyroid studies in 4-6 weeks for subclinical hypothyroidism 6. A1c low, hypoglycemia in house, insulin d/c'd - follow outpatient blood sugars and adjust therapy as needed 7. Follow ear hematoma outpatient - consider ENT follow up as needed - ice/elevated head of bed as needed 8.  prozac d/c'd follow mood outpatient 9. Encourage smoking cessation  10. Continue to monitor prolonged qtc outpatient    Discharge Diagnoses:  Principal Problem:   Acute encephalopathy Active Problems:   Uncontrolled type 2 diabetes mellitus with complication (HCC)   AMS (altered mental status)   ESRD (end stage renal disease) (HCC)   Hypertension   Lymphocytopenia   Ear hematoma, right, initial encounter   Prolonged QT interval  History of present illness:  [name] L Nanceis a 60 y.o.femalewith medical history significant forESRD on hemodialysis, history of CVA, hypertension, type 2 diabetes mellitus, and chronic diastolic CHF, now presenting to the emergency department with confusion and a fall.Workup has not shown a clear cause, of note, she's had multiple admissions with concern for encephalopathy over the past couple of months.  Some were thought to be related to missed HD and/or polypharmacy.  She was recently discharged on 4/19 for colitis and her oxycodone was discontinued as a possible contributor to her confusion.  She had negative imaging.  Workup was notable for subclinical hypothyroidism.  She was started on synthroid and thiamine with pending B1 study.  EEG was negative for seizures.  She was  back to her baseline on hospital day 2 and discharged back to SNF with plan for neurology follow up outpatient and follow pending studies.     Hospital Course:  1.Acute encephalopathy -Presents after a fall and noted to be confused - Per chart review and discussion with pt's sister, there have been recurrent spells of confusion and weakness with frequent falls for at least one year -Head CT with right postauricular soft tissue edema, no acute intracranial abnormality  - recent admissions complicated by AMS - oxycodone discontinued at discharge from 4/19 hospitalization - gabapentin and topamax discontinued on 3/3 discharge at last admission - hold prozac at discharge - ciprofloxacin also has been d/c'd - will obtain EEG - mild diffuse encephalopathy, nonsepecific - TBI/concussion possible? - Follow vitamin b1 (pending at discharge), vitamin b12 wnl, nonreactive RPR, ammonia wnl, folate wnl - subclinical hypothyroidism -> will treat - UA and culture no growth  - delirium precautions  2.ESRD -Completed HD 10/13/20 -Continue renal diet, fluid-restrictions, consult nephro for maintenance HD if she remains in hospital on 10/16/20  # Fall - found down, unclear what happened - follow EEG and orthostatics - CT head with right postauricular soft tissue edema, CT C spine without acute fx or subluxation, normal CT of temporal bones, plain films of L and R knee with out acute bony abnormalities, plain films of pelvis negative, plain film of R hand without acute fx (old fx deformities of distal radial metaphysis and ulnar styloid process) - PT   # Subclinical Hypothyroidism - will treat with TSH >10 and encephalopathy above - follow labs in 4-6 weeks  3.Ear hematoma -No underlying fractures, ED physician discussed  with ENT who recommended ice and elevating head of bed  4.Prolonged QT interval -QTc 539 ms in ED -Minimize QT-prolonging medications, check magnesium  level, repeat EKG with qtc 516 - follow outpatient and continue to minimize qt prolonging meds  5.Hypertension -Continue Norvasc, Coreg - on clonidine prn outpatient  6.Type II DM -A1c was 6.8% in March 2022 -A1c 5.9 4/24 - hold insulin at discharge - follow blood sugars  # Hx CVA Continue aspirin, statin  # Hx Depression prozac on hold  Follow with outpatient provider for adjustments prn  # HLD Continue statin  7. Colitis  - stop abx, continue to monitor  # tobacco abuse - enocourage cessation

## 2020-11-29 ENCOUNTER — Inpatient Hospital Stay: Payer: Medicare Other | Admitting: Physician Assistant

## 2020-12-14 ENCOUNTER — Telehealth: Payer: Self-pay | Admitting: Gastroenterology

## 2020-12-14 NOTE — Telephone Encounter (Signed)
Hi Dr. Fuller Plan,  D.O.D   We received a referral for this patient to be seen for an evaluation for nausea\vomiting from [facility].   I am sending your all the notes received for review. Please advise on scheduling.   Thank you

## 2020-12-17 NOTE — Telephone Encounter (Signed)
Per Dr. Fuller Plan patient needs to have an office visit with Dr. Candis Schatz tried to reach [facility] to advise no answer.

## 2021-01-17 ENCOUNTER — Encounter: Payer: Self-pay | Admitting: Neurology

## 2021-01-17 ENCOUNTER — Other Ambulatory Visit: Payer: Self-pay

## 2021-01-17 ENCOUNTER — Ambulatory Visit (INDEPENDENT_AMBULATORY_CARE_PROVIDER_SITE_OTHER): Payer: Medicare Other | Admitting: Neurology

## 2021-01-17 VITALS — Ht <= 58 in | Wt 99.0 lb

## 2021-01-17 DIAGNOSIS — G959 Disease of spinal cord, unspecified: Secondary | ICD-10-CM | POA: Diagnosis not present

## 2021-01-17 DIAGNOSIS — R410 Disorientation, unspecified: Secondary | ICD-10-CM | POA: Diagnosis not present

## 2021-01-17 DIAGNOSIS — W19XXXA Unspecified fall, initial encounter: Secondary | ICD-10-CM

## 2021-01-17 NOTE — Progress Notes (Signed)
[facility] [address] [place]. [place] [address] [phone]       OFFICE FOLLOW UP VISIT NOTE  [name] Date of Birth:  03/09/1961 Medical Record Number:  [id number]   Referring MD: Fanny Skates Reason for Referral: TIA HPI: Initial visit 08/01/2020 :[name] is a 60 year old Caucasian lady seen today for initial office consultation visit.  She is accompanied by her CNA from [facility] skilled nursing facility where she lives.  History is obtained from her and review of electronic medical records and personally reviewed pertinent imaging films in PACS.[name] Michael is an 60 y.o. female with ESRD on HD, depression, HLD, HTN, osteoporosis, left frontal subcortical stroke, TIA, tobacco abuse, DM2 and vitamin D deficiency, who presents acutely via EMS from her hemodialysis center for acute onset of AMS. She was reportedly at her normal baseline when she arrived at the dialysis center. During dialysis she began to exhibit somnolence and slurred speech. The dialysis session was continued for another hour and then EMS was called. On arrival she was lethargic with dysarthria and disorientation - she was unable to state where she was and also was disoriented to time. No focal weakness was noted by EMS. She did not exhibit any seizure-like activity per EMS. On arrival to the ED she continued to exhibit AMS and was lethargic/somnolent.  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  Patient was started on aspirin and a statin.  Patient states that since then she has had frequent falls.  Most of the falls are related to change in body position particularly when she tried to get out of bed.  She was seen in the ER on 06/28/2020 for 1 such fall when she had a scalp hematoma and CT scan of the head showed no  acute abnormality.  Patient does have longstanding diabetes and likely diabetic neuropathy.  She complains of tingling numbness paresthesias paresthesias in the feet all the time and at times she cannot sleep because of this.  She has not tried medications like gabapentin or Topamax yet.  She is on dialysis for last 2 years.  She has been living in a nursing home.  She is walking with a walker and has to be careful but still has had a few falls.  She states her falls are sudden she has no warning.  She does not lose consciousness.  There is no witnessed seizure-like activity noted. Update 01/17/2021: Patient is seen for follow-up today after hospital admission in April 2022 for episode of confusion and fall.  Patient is unable to tell me what happened.  Review of electronic medical records show that she was admitted with confusion of unclear clear etiology and a fall.  Head CT scan showed right posterior auricular soft tissue edema but no acute abnormality.  She had similar prior admissions which complicated by altered mental status felt to be related to medication and she had been on Maxidone oxycodone which was discontinued.  Another admission she was on gabapentin and Topamax which were discontinued and felt to be causing confusion.  She had EEG during the admission which showed mild diffuse slowing consistent with encephalopathy but no epileptiform activity.  She was found to have subclinical hypothyroidism with elevated TSH of 13 and she was started on Synthroid.  Patient  complains of feeling imbalance and uses a wheeled walker.  She states she has some neck pain and muscle tightness but denies true radicular pain. ROS:   14 system review of systems is positive for confusion, falls, back pain, tingling, numbness, slurred speech, sleepiness, tiredness and all other systems negative  PMH:  Past Medical History:  Diagnosis Date   Ambulates with cane    Constipation    Depression    ESRD (end stage renal  disease) (HCC)    a. TTS Dialysis.   GERD (gastroesophageal reflux disease)    History of stress test    a. 01/2003 MV: EF 74%, no ischemia/infarct.   Hyperlipidemia    Hypertension    Osteoporosis    Stroke [facility]) 04-01-11   left frontal subcortical, saw Dr. [name]    Syncope 11/2019   TIA (transient ischemic attack) 03-12-11   Tobacco abuse    Tricuspid regurgitation    a. 05/2016 Echo: EF 65-70%, Gr2DD, mild MR, nl RV fxn, Triv TR, PASP 75mHg; b. 05/2018 Echo: EF 60-65%, Gr2DD, mild MR/TR, RVSP/PASP 310mg; c. 11/2019 Echo: EF 55-60%, no rwma, mild-mod MR, Sev TR w/ RV dilatation (CTA chest neg for PE).   Type II diabetes mellitus (HCC)    Vitamin D deficiency     Social History:  Social History   Socioeconomic History   Marital status: Single    Spouse name: Not on file   Number of children: Not on file   Years of education: Not on file   Highest education level: Not on file  Occupational History   Not on file  Tobacco Use   Smoking status: Every Day    Packs/day: 0.30    Years: 32.00    Pack years: 9.60    Types: Cigarettes    Last attempt to quit: 09/23/2012    Years since quitting: 8.3   Smokeless tobacco: Never   Tobacco comments:    8 cigarettes/day  Vaping Use   Vaping Use: Never used  Substance and Sexual Activity   Alcohol use: No   Drug use: No   Sexual activity: Not Currently    Birth control/protection: Post-menopausal  Other Topics Concern   Not on file  Social History Narrative   [facility]    Right Handed   Drinks 2-4 cups caffeine daily      Social Determinants of Health   Financial Resource Strain: Not on file  Food Insecurity: Not on file  Transportation Needs: Not on file  Physical Activity: Not on file  Stress: Not on file  Social Connections: Not on file  Intimate Partner Violence: Not on file    Medications:   Current Outpatient Medications on File Prior to Visit  Medication Sig Dispense Refill    Amino Acids-Protein Hydrolys (FEEDING SUPPLEMENT, PRO-STAT SUGAR FREE 64,) LIQD Take 30 mLs by mouth in the morning. WILD CHERRY FLAVOR     aspirin 81 MG chewable tablet Chew 81 mg by mouth daily.     atorvastatin (LIPITOR) 40 MG tablet Take 40 mg by mouth at bedtime.      b complex-vitamin c-folic acid (NEPHRO-VITE) 0.8 MG TABS tablet Take 1 tablet by mouth at bedtime.     carvedilol (COREG) 6.25 MG tablet Take 1 tablet (6.25 mg total) by mouth 2 (two) times daily with a meal. 60 tablet 0   cloNIDine (CATAPRES) 0.1 MG tablet Take 0.1 mg by mouth every 8 (eight) hours as needed (BP>160/90).  FLUoxetine (PROZAC) 20 MG capsule Take 20 mg by mouth daily.     hydrocortisone cream 1 % Apply 1 application topically 2 (two) times daily. Perparation H     lactose free nutrition (BOOST PLUS) LIQD Take 237 mLs by mouth 3 (three) times daily with meals.     midodrine (PROAMATINE) 10 MG tablet Take 10 mg by mouth See admin instructions. Take 1 tablet (10 mg) by mouth prior to dialysis on Tuesday, Thursday, Saturday     omega-3 acid ethyl esters (LOVAZA) 1 g capsule Take 2 capsules by mouth 2 (two) times daily.     omeprazole (PRILOSEC) 40 MG capsule Take 40 mg by mouth daily before breakfast.      ondansetron (ZOFRAN) 4 MG tablet Take 4 mg by mouth every 8 (eight) hours as needed for nausea or vomiting.     polyethylene glycol powder (GLYCOLAX/MIRALAX) 17 GM/SCOOP powder Take 17 g by mouth See admin instructions. Mix 17 grams of powder into 8 ounces of fluid, stir, and drink (by mouth) once a day     SACCHAROMYCES BOULARDII PO Take 1 capsule by mouth 2 (two) times daily.     senna-docusate (SENOKOT-S) 8.6-50 MG tablet Take 1 tablet by mouth at bedtime as needed for mild constipation. 30 tablet 0   sodium bicarbonate 650 MG tablet Take 1,300 mg by mouth 2 (two) times daily.     levothyroxine (SYNTHROID) 50 MCG tablet Take 1 tablet (50 mcg total) by mouth daily at 6 (six) AM. 30 tablet 0   No current  facility-administered medications on file prior to visit.    Allergies:   Allergies  Allergen Reactions   Hydrocodone Nausea And Vomiting and Other (See Comments)    "Allergic," per [facility]    Physical Exam General: Frail petite malnourished looking middle-aged Caucasian lady, seated, in no evident distress Head: head normocephalic and atraumatic.   Neck: supple with no carotid or supraclavicular bruits Cardiovascular: regular rate and rhythm, soft ejection systolic murmur.   Musculoskeletal: Mild kyphoscoliosis skin:  no rash/petichiae Vascular:  Normal pulses all extremities Abdomen protuberant with distention  Neurologic Exam Mental Status: Awake and fully alert. Oriented to place and time. Recent and remote memory intact. Attention span, concentration and fund of knowledge appropriate. Mood and affect appropriate.  Diminished recall 2/3. Cranial Nerves: Fundoscopic exam difficult through undilated pupils..  Right pupil 3 mm reactive left 2 mm not reactive.  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  Extraocular movements full without nystagmus. Visual fields full to confrontation. Hearing intact. Facial sensation intact. Face, tongue, palate moves normally and symmetrically.  Motor: Normal bulk and tone. Normal strength in all tested extremity muscles except mild symmetric weakness of hip flexors and ankle dorsiflexors bilaterally.. Sensory.: intact to touch , pinprick , but mildly impaired position and vibratory sensation in both feet from ankle down..  Coordination: Rapid alternating movements normal in all extremities. Finger-to-nose and heel-to-shin performed accurately bilaterally. Gait and Station: Arises from chair without difficulty. Stance is stooped gait demonstrates broad base and mild imbalance.  Favors her back due to pain..  Uses a walker.  Unable to stand on either foot unsupported and on her heels. Reflexes: 3+ brisk and symmetric in upper extremities  and slightly depressed in both lower extremities.. Toes downgoing.   NIHSS 0 Modified Rankin 2   ASSESSMENT: 60 year old Caucasian lady with recurrent transient episodes of altered mental status of unclear etiology during dialysis unclear whether related  to fluid shift, hypertension or possibly TIA versus seizure in October 2021.  Recent increased frequency of falls likely multifactorial due to combination of diabetic neuropathy, poor vision degenerative arthritis.  And suspected cervical myelopathy.     PLAN: I had a long discussion with the patient regarding her frequent falls and this is likely multifactorial due to combination of diabetic neuropathy and possibly cervical myelopathy given her brisk reflexes on exam.  I recommend checking MRI scan cervical spine as well as checking thyroid function test to see if her thyroid has been adequately replaced.  She was advised to use a walker at all times and we discussed fall safety precautions.  She will return for follow-up in the future in 3 months with my nurse practitioner Janett Billow or call earlier if necessary.Greater than 50% time during this 25-minute  visit was spent on counseling and coordination of care about her episodes of altered consciousness, TIA versus seizure as well as discussion with frequent falls Antony Contras, MD  [facility] [address] [place], [place] [address]  Phone [phone] Fax [phone] Note: This document was prepared with digital dictation and possible smart phrase technology. Any transcriptional errors that result from this process are unintentional.

## 2021-01-17 NOTE — Patient Instructions (Signed)
I had a long discussion with the patient regarding her frequent falls and this is likely multifactorial due to combination of diabetic neuropathy and possibly cervical myelopathy given her brisk reflexes on exam.  I recommend checking MRI scan cervical spine as well as checking thyroid function test to see if her thyroid has been adequately replaced.  She was advised to use a walker at all times and we discussed fall safety precautions.  She will return for follow-up in the future in 3 months with my nurse practitioner Janett Billow or call earlier if necessary. Fall Prevention in the Home, Adult Falls can cause injuries and can happen to people of all ages. There are many things you can do to make your home safe and to help prevent falls. Ask forhelp when making these changes. What actions can I take to prevent falls? General Instructions Use good lighting in all rooms. Replace any light bulbs that burn out. Turn on the lights in dark areas. Use night-lights. Keep items that you use often in easy-to-reach places. Lower the shelves around your home if needed. Set up your furniture so you have a clear path. Avoid moving your furniture around. Do not have throw rugs or other things on the floor that can make you trip. Avoid walking on wet floors. If any of your floors are uneven, fix them. Add color or contrast paint or tape to clearly mark and help you see: Grab bars or handrails. First and last steps of staircases. Where the edge of each step is. If you use a stepladder: Make sure that it is fully opened. Do not climb a closed stepladder. Make sure the sides of the stepladder are locked in place. Ask someone to hold the stepladder while you use it. Know where your pets are when moving through your home. What can I do in the bathroom?     Keep the floor dry. Clean up any water on the floor right away. Remove soap buildup in the tub or shower. Use nonskid mats or decals on the floor of the tub or  shower. Attach bath mats securely with double-sided, nonslip rug tape. If you need to sit down in the shower, use a plastic, nonslip stool. Install grab bars by the toilet and in the tub and shower. Do not use towel bars as grab bars. What can I do in the bedroom? Make sure that you have a light by your bed that is easy to reach. Do not use any sheets or blankets for your bed that hang to the floor. Have a firm chair with side arms that you can use for support when you get dressed. What can I do in the kitchen? Clean up any spills right away. If you need to reach something above you, use a step stool with a grab bar. Keep electrical cords out of the way. Do not use floor polish or wax that makes floors slippery. What can I do with my stairs? Do not leave any items on the stairs. Make sure that you have a light switch at the top and the bottom of the stairs. Make sure that there are handrails on both sides of the stairs. Fix handrails that are broken or loose. Install nonslip stair treads on all your stairs. Avoid having throw rugs at the top or bottom of the stairs. Choose a carpet that does not hide the edge of the steps on the stairs. Check carpeting to make sure that it is firmly attached to the stairs.  Fix carpet that is loose or worn. What can I do on the outside of my home? Use bright outdoor lighting. Fix the edges of walkways and driveways and fix any cracks. Remove anything that might make you trip as you walk through a door, such as a raised step or threshold. Trim any bushes or trees on paths to your home. Check to see if handrails are loose or broken and that both sides of all steps have handrails. Install guardrails along the edges of any raised decks and porches. Clear paths of anything that can make you trip, such as tools or rocks. Have leaves, snow, or ice cleared regularly. Use sand or salt on paths during winter. Clean up any spills in your garage right away. This  includes grease or oil spills. What other actions can I take? Wear shoes that: Have a low heel. Do not wear high heels. Have rubber bottoms. Feel good on your feet and fit well. Are closed at the toe. Do not wear open-toe sandals. Use tools that help you move around if needed. These include: Canes. Walkers. Scooters. Crutches. Review your medicines with your doctor. Some medicines can make you feel dizzy. This can increase your chance of falling. Ask your doctor what else you can do to help prevent falls. Where to find more information Centers for Disease Control and Prevention, STEADI: http://www.wolf.info/ National Institute on Aging: http://kim-miller.com/ Contact a doctor if: You are afraid of falling at home. You feel weak, drowsy, or dizzy at home. You fall at home. Summary There are many simple things that you can do to make your home safe and to help prevent falls. Ways to make your home safe include removing things that can make you trip and installing grab bars in the bathroom. Ask for help when making these changes in your home. This information is not intended to replace advice given to you by your health care provider. Make sure you discuss any questions you have with your healthcare provider. Document Revised: 01/11/2020 Document Reviewed: 01/11/2020 Elsevier Patient Education  [employer].

## 2021-01-21 ENCOUNTER — Telehealth: Payer: Self-pay

## 2021-01-21 NOTE — Telephone Encounter (Signed)
-----   Message from Garvin Fila, MD sent at 01/21/2021  4:27 PM EDT ----- Mitchell Heir inform the patient that thyroid function is slightly underactive and to contact primary care physician to discuss appropriate thyroid replacement ----- Message ----- From: Desmond Lope, RN Sent: 01/21/2021   8:56 AM EDT To: Garvin Fila, MD

## 2021-01-21 NOTE — Telephone Encounter (Signed)
I called the pt and spoke with assisted living nurse, Erlene Quan. She sts the pt was at Dialysis and would have her call back to review results of blood test.  I provided hours of office for the pt.

## 2021-01-22 ENCOUNTER — Emergency Department (HOSPITAL_COMMUNITY)
Admission: EM | Admit: 2021-01-22 | Discharge: 2021-01-22 | Disposition: A | Discharge status: Home / Self Care | Payer: Medicare Other | Source: Home / Self Care | Attending: Emergency Medicine | Admitting: Emergency Medicine

## 2021-01-22 ENCOUNTER — Emergency Department (HOSPITAL_COMMUNITY): Payer: Medicare Other

## 2021-01-22 ENCOUNTER — Other Ambulatory Visit: Payer: Self-pay

## 2021-01-22 DIAGNOSIS — Z7982 Long term (current) use of aspirin: Secondary | ICD-10-CM | POA: Insufficient documentation

## 2021-01-22 DIAGNOSIS — N186 End stage renal disease: Secondary | ICD-10-CM | POA: Insufficient documentation

## 2021-01-22 DIAGNOSIS — M546 Pain in thoracic spine: Secondary | ICD-10-CM | POA: Insufficient documentation

## 2021-01-22 DIAGNOSIS — W07XXXA Fall from chair, initial encounter: Secondary | ICD-10-CM | POA: Insufficient documentation

## 2021-01-22 DIAGNOSIS — Z79899 Other long term (current) drug therapy: Secondary | ICD-10-CM | POA: Insufficient documentation

## 2021-01-22 DIAGNOSIS — R0902 Hypoxemia: Secondary | ICD-10-CM | POA: Insufficient documentation

## 2021-01-22 DIAGNOSIS — I5032 Chronic diastolic (congestive) heart failure: Secondary | ICD-10-CM | POA: Insufficient documentation

## 2021-01-22 DIAGNOSIS — F17210 Nicotine dependence, cigarettes, uncomplicated: Secondary | ICD-10-CM | POA: Insufficient documentation

## 2021-01-22 DIAGNOSIS — Z992 Dependence on renal dialysis: Secondary | ICD-10-CM | POA: Insufficient documentation

## 2021-01-22 DIAGNOSIS — E1122 Type 2 diabetes mellitus with diabetic chronic kidney disease: Secondary | ICD-10-CM | POA: Insufficient documentation

## 2021-01-22 DIAGNOSIS — K649 Unspecified hemorrhoids: Secondary | ICD-10-CM | POA: Insufficient documentation

## 2021-01-22 DIAGNOSIS — A4151 Sepsis due to Escherichia coli [E. coli]: Secondary | ICD-10-CM | POA: Diagnosis not present

## 2021-01-22 DIAGNOSIS — R4182 Altered mental status, unspecified: Secondary | ICD-10-CM | POA: Diagnosis not present

## 2021-01-22 DIAGNOSIS — I132 Hypertensive heart and chronic kidney disease with heart failure and with stage 5 chronic kidney disease, or end stage renal disease: Secondary | ICD-10-CM | POA: Insufficient documentation

## 2021-01-22 DIAGNOSIS — W19XXXA Unspecified fall, initial encounter: Secondary | ICD-10-CM

## 2021-01-22 MED ORDER — HYDROCORTISONE (PERIANAL) 2.5 % EX CREA: 1.0000 "application " | TOPICAL_CREAM | Freq: Two times a day (BID) | CUTANEOUS | 0 refills | Status: DC

## 2021-01-22 MED ORDER — LIDOCAINE 5 % EX OINT: 1.0000 "application " | TOPICAL_OINTMENT | CUTANEOUS | 0 refills | Status: DC | PRN

## 2021-01-22 NOTE — ED Notes (Signed)
[facility] called for transportation back to [facility]

## 2021-01-22 NOTE — Discharge Instructions (Addendum)
You were seen in the ER after you had a fall. The x-rays are normal.  He did not have any headaches or neurologic complaints therefore no CT scan of the brain was ordered. Please return to the ER if you start developing severe headaches, one-sided weakness, numbness, confusion, vision change.  Take over-the-counter pain medications for pain control.

## 2021-01-22 NOTE — ED Triage Notes (Signed)
Patient is A&Ox4 complaining of head pain, and rectal pain. Patient was on the way back from dialysis and fell on the bus and hit her head. Fistula in L upper arm. SPO2 84% RA EMS put her on 4L and it went up to 93%. 128/67- 76-94% 4L  CBG: 117

## 2021-01-22 NOTE — Telephone Encounter (Signed)
Called patient, at assisted living. Call was transferred, no one answered. Will call back.

## 2021-01-22 NOTE — ED Provider Notes (Signed)
[facility] DEPT Provider Note   CSN: ID:[id number] Arrival date & time: 01/22/21  1645     History Chief Complaint  Patient presents with   [name] Michael is a 60 y.o. female.  HPI     60 year old female comes in with chief complaint of fall.  Patient has history of ESRD on HD, hypertension, hyperlipidemia, stroke.  She reports that after hemodialysis, she fell down from her chair.  She struck the back of her head.  She has no headaches. Pt has no nausea, vomiting, seizures, loss of consciousness or new visual complains, weakness, numbness, dizziness or gait instability.  Patient is not on any blood thinners.  She does indicate having some upper back pain.  Per EMS, patient was hypoxic when they arrived and she was put on 4 L of oxygen.  She denies any chest pain.  Patient does indicate having some rectal pain.  She has history of hemorrhoids.   Past Medical History:  Diagnosis Date   Ambulates with cane    Constipation    Depression    ESRD (end stage renal disease) ([place])    a. TTS Dialysis.   GERD (gastroesophageal reflux disease)    History of stress test    a. 01/2003 MV: EF 74%, no ischemia/infarct.   Hyperlipidemia    Hypertension    Osteoporosis    Stroke [facility]) 04-01-11   left frontal subcortical, saw Dr. [name]    Syncope 11/2019   TIA (transient ischemic attack) 03-12-11   Tobacco abuse    Tricuspid regurgitation    a. 05/2016 Echo: EF 65-70%, Gr2DD, mild MR, nl RV fxn, Triv TR, PASP 28mHg; b. 05/2018 Echo: EF 60-65%, Gr2DD, mild MR/TR, RVSP/PASP 324mg; c. 11/2019 Echo: EF 55-60%, no rwma, mild-mod MR, Sev TR w/ RV dilatation (CTA chest neg for PE).   Type II diabetes mellitus ([facility]   Vitamin D deficiency     Patient Active Problem List   Diagnosis Date Noted   Lymphocytopenia 10/13/2020   Ear hematoma, right, initial encounter 10/13/2020   Prolonged QT interval 10/13/2020   Lactic acidosis 10/05/2020   Transaminitis  10/05/2020   Acute respiratory failure with hypoxia ([facility]CP[place]04/15/2022   Altered mental status 030000000 Acute metabolic encephalopathy 0399991111 Severe tricuspid regurgitation 12/04/2019   ESRD (end stage renal disease) ([place]   GERD (gastroesophageal reflux disease)    Hypertension    Type II diabetes mellitus ([facility]   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   AMS (altered mental status) 04/28/2019   Hypoxemia 04/28/2019   Fall 06/09/2018   Fall at home, initial encounter 06/09/2018   Anemia of chronic disease 06/09/2018   Syncope and collapse 06/09/2018   Acute encephalopathy 03/10/2018   Hypermagnesemia 03/10/2018   Acute lower UTI 11/09/2016   Uncontrolled type 2 diabetes mellitus with complication ([facility]   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 associated with diabetes mellitus due to underlying condition (HCC)    Chronic diastolic CHF (congestive heart failure) ([facility]   Proliferative diabetic retinopathy (HCWestfir11/12/2015   Poor social situation 03/09/2013   Depression 03/01/2013   Abnormal mammogram 12/20/2012   Retinal detachment 11/17/2012   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 ([id number] Hyperlipidemia 02/09/2007   Essential hypertension 02/09/2007    Past Surgical History:  Procedure Laterality Date   AV FISTULA PLACEMENT Left 02/21/2019   Procedure: BRACHIOCEPHALIC ARTERIOVENOUS (AV)  FISTULA CREATION;  Surgeon: [name], MD;  Location: [place];  Service: Vascular;  Laterality: Left;   [place] Left 04/11/2019   Procedure: SECOND STAGE BASILIC VEIN TRANSPOSITION LEFT ARM;  Surgeon: [name], MD;  Location: [place];  Service: Vascular;  Laterality: Left;   IR FLUORO GUIDE CV LINE RIGHT  04/29/2019   IR US GUIDE VASC ACCESS RIGHT  04/29/2019   OPEN REDUCTION INTERNAL FIXATION (ORIF) DISTAL RADIAL FRACTURE Left 01/28/2016   Procedure: OPEN REDUCTION INTERNAL FIXATION (ORIF)  DISTAL RADIAL FRACTURE;  Surgeon: Iran Planas, MD;  Location: [place];  Service: Orthopedics;  Laterality: Left;   [place]   RIGHT HEART CATH N/A 12/05/2019   Procedure: RIGHT HEART CATH;  Surgeon: Nelva Bush, MD;  Location: [place] CV LAB;  Service: Cardiovascular;  Laterality: N/A;   TONSILLECTOMY AND ADENOIDECTOMY     age 29     OB History   No obstetric history on file.     Family History  Problem Relation Age of Onset   Stroke Mother    Diabetes Mother    Kidney failure Mother    Heart failure Mother    Stroke Father    Cancer Sister        Breast- 31's    Social History   Tobacco Use   Smoking status: Every Day    Packs/day: 0.30    Years: 32.00    Pack years: 9.60    Types: Cigarettes    Last attempt to quit: 09/23/2012    Years since quitting: 8.3   Smokeless tobacco: Never   Tobacco comments:    8 cigarettes/day  Vaping Use   Vaping Use: Never used  Substance Use Topics   Alcohol use: No   Drug use: No    Home Medications Prior to Admission medications   Medication Sig Start Date End Date Taking? Authorizing Provider  Amino Acids-Protein Hydrolys (FEEDING SUPPLEMENT, PRO-STAT SUGAR FREE 64,) LIQD Take 30 mLs by mouth in the morning. WILD CHERRY FLAVOR    [provider]  aspirin 81 MG chewable tablet Chew 81 mg by mouth daily.    [provider]  atorvastatin (LIPITOR) 40 MG tablet Take 40 mg by mouth at bedtime.     [provider]  b complex-vitamin c-folic acid (NEPHRO-VITE) 0.8 MG TABS tablet Take 1 tablet by mouth at bedtime.    [provider]  carvedilol (COREG) 6.25 MG tablet Take 1 tablet (6.25 mg total) by mouth 2 (two) times daily with a meal. 10/09/20 01/17/21  Darliss Cheney, MD  cloNIDine (CATAPRES) 0.1 MG tablet Take 0.1 mg by mouth every 8 (eight) hours as needed (BP>160/90).    [provider]  FLUoxetine (PROZAC) 20 MG capsule Take 20 mg by mouth daily.  12/21/20   [provider]  hydrocortisone cream 1 % Apply 1 application topically 2 (two) times daily. Perparation H    [provider]  lactose free nutrition (BOOST PLUS) LIQD Take 237 mLs by mouth 3 (three) times daily with meals.    [provider]  levothyroxine (SYNTHROID) 50 MCG tablet Take 1 tablet (50 mcg total) by mouth daily at 6 (six) AM. 10/16/20 11/15/20  Elodia [name]., MD  midodrine (PROAMATINE) 10 MG tablet Take 10 mg by mouth See admin instructions. Take 1 tablet (10 mg) by mouth prior to dialysis on Tuesday, Thursday, Saturday    [provider]  omega-3 acid ethyl esters (LOVAZA) 1 g capsule Take 2 capsules by mouth 2 (two) times daily. 05/28/20   [provider]  omeprazole (PRILOSEC) 40 MG capsule Take 40 mg by mouth daily before breakfast.     [provider]  ondansetron (ZOFRAN) 4 MG tablet Take 4 mg by mouth every 8 (eight) hours as needed for nausea or vomiting.    [provider]  polyethylene glycol powder (GLYCOLAX/MIRALAX) 17 GM/SCOOP powder Take 17 g by mouth See admin instructions. Mix 17 grams of powder into 8 ounces of fluid, stir, and drink (by mouth) once a day    [provider]  SACCHAROMYCES BOULARDII PO Take 1 capsule by mouth 2 (two) times daily.    [provider]  senna-docusate (SENOKOT-S) 8.6-50 MG tablet Take 1 tablet by mouth at bedtime as needed for mild constipation. 06/11/18   Regalado, [name], MD  sodium bicarbonate 650 MG tablet Take 1,300 mg by mouth 2 (two) times daily.    [provider]    Allergies    Hydrocodone  Review of Systems   Review of Systems  Constitutional:  Positive for activity change.  Respiratory:  Negative for shortness of breath.   Cardiovascular:  Negative for chest pain.  Gastrointestinal:  Negative for nausea and vomiting.  Musculoskeletal:  Positive for back pain.  Neurological:  Negative for headaches.  Hematological:   Does not bruise/bleed easily.  All other systems reviewed and are negative.  Physical Exam Updated Vital Signs BP (!) 158/145 (BP Location: Right Arm)   Pulse 76   Temp 98.3 F (36.8 C) (Oral)   Resp 18   Ht '4\' 6"'$  (1.372 m)   Wt 46.3 kg   LMP 03/06/2013 (LMP Unknown)   SpO2 94%   BMI 24.59 kg/m   Physical Exam Vitals and nursing note reviewed.  Constitutional:      Appearance: She is well-developed.  HENT:     Head: Atraumatic.  Cardiovascular:     Rate and Rhythm: Normal rate.  Pulmonary:     Effort: Pulmonary effort is normal.  Abdominal:     Comments: Patient has hemorrhoids, slight blood noted around them.  She also has erythema in the gluteal fold, no induration, no fluctuance  Musculoskeletal:     Cervical back: Normal range of motion and neck supple.     Comments: Head to toe evaluation shows no hematoma, bleeding of the scalp, no facial abrasions, no spine step offs, crepitus of the chest or neck, no tenderness to palpation of the bilateral upper and lower extremities, no gross deformities, no chest tenderness, no pelvic pain.  No midline c-spine tenderness, pt able to turn head to 45 degrees bilaterally without any pain and able to flex neck to the chest and extend without any pain or neurologic symptoms.   Skin:    General: Skin is warm and dry.  Neurological:     Mental Status: She is alert and oriented to person, place, and time.    ED Results / Procedures / Treatments   Labs (all labs ordered are listed, but only abnormal results are displayed) Labs Reviewed - No data to display  EKG None  Radiology DG Chest 1 View  Result Date: 01/22/2021 CLINICAL DATA:  Fall hypoxia EXAM: CHEST  1 VIEW COMPARISON:  10/13/2020 FINDINGS: Cardiomegaly with aortic atherosclerosis. No focal opacity or pleural effusion. No pneumothorax. IMPRESSION: No active disease.  Cardiomegaly Electronically Signed   By: Madie Reno.D.  On: 01/22/2021 18:57   DG Thoracic Spine 2  View  Result Date: 01/22/2021 CLINICAL DATA:  Fall EXAM: THORACIC SPINE 2 VIEWS COMPARISON:  Thoracic spine radiograph 02/29/2020 FINDINGS: There is no radiographically evident thoracic spine fracture. Mild multilevel degenerative changes. IMPRESSION: No radiographically evident thoracic spine fracture. Mild multilevel degenerative changes. Electronically Signed   By: Maurine Simmering   On: 01/22/2021 18:58    Procedures Procedures   Medications Ordered in ED Medications - No data to display  ED Course  I have reviewed the triage vital signs and the nursing notes.  Pertinent labs & imaging results that were available during my care of the patient were reviewed by me and considered in my medical decision making (see chart for details).    MDM Rules/Calculators/A&P                           60 year old comes in with chief complaint of fall.  From the fall perspective, no red flags elevated ICP.  Brain and C-spine cleared clinically using [place] CT head and C-spine rule.  X-rays ordered given that she is complaining of some back pain and was hypoxic at 1 point.  She also has hemorrhoids, they will need medical management.  8:15 PM Patient reassessed.  O2 sats are about 91% on room air. X-rays reviewed.  They are reassuring.  Patient denies any chest pain, pleurisy, shortness of breath.  She still does not have any headache.  She is stable for discharge.  Strict ER return precautions discussed.   Final Clinical Impression(s) / ED Diagnoses Final diagnoses:  Fall, initial encounter    Rx / DC Orders ED Discharge Orders     None        Varney Biles, MD 01/22/21 2015

## 2021-01-23 ENCOUNTER — Encounter: Payer: Self-pay | Admitting: Gastroenterology

## 2021-01-23 ENCOUNTER — Ambulatory Visit (INDEPENDENT_AMBULATORY_CARE_PROVIDER_SITE_OTHER): Payer: Medicare Other | Admitting: Gastroenterology

## 2021-01-23 VITALS — BP 130/80 | HR 71 | Ht <= 58 in | Wt 102.2 lb

## 2021-01-23 DIAGNOSIS — K649 Unspecified hemorrhoids: Secondary | ICD-10-CM | POA: Diagnosis not present

## 2021-01-23 DIAGNOSIS — R159 Full incontinence of feces: Secondary | ICD-10-CM

## 2021-01-23 DIAGNOSIS — R197 Diarrhea, unspecified: Secondary | ICD-10-CM | POA: Diagnosis not present

## 2021-01-23 DIAGNOSIS — R112 Nausea with vomiting, unspecified: Secondary | ICD-10-CM

## 2021-01-23 NOTE — Telephone Encounter (Signed)
[place], spoke with her nurse on [place]. She asked me to fax lab results to her. I advised will fax today. Dr Clydene Fake result note included and fax sent. [phone]. Received confirmation.

## 2021-01-23 NOTE — Patient Instructions (Signed)
If you are age 60 or older, your body mass index should be between 23-30. Your Body mass index is 24.64 kg/m. If this is out of the aforementioned range listed, please consider follow up with your Primary Care Provider.  If you are age 11 or younger, your body mass index should be between 19-25. Your Body mass index is 24.64 kg/m. If this is out of the aformentioned range listed, please consider follow up with your Primary Care Provider.   Stop Miralax.  Start Metamucil once daily as needed for constipation.  Use loperamide for diarrhea and incontinence.  Use a barrier cream ( Zinc Oxide)    The [facility] providers would like to encourage you to use [facility] to communicate with providers for non-urgent requests or questions.  Due to long hold times on the telephone, sending your provider a message by [facility] may be a faster and more efficient way to get a response.  Please allow 48 business hours for a response.  Please remember that this is for non-urgent requests.   It was a pleasure to see you today!  Thank you for trusting me with your gastrointestinal care!    Scott E. Candis Schatz, MD

## 2021-01-23 NOTE — Progress Notes (Addendum)
HPI : [name] Michael is a 60 year old female with end-stage renal disease on dialysis and poor functional status in an assisted living facility who was referred to gastroenterology by Dr.Newlin, MD because of symptoms of chronic nausea and vomiting and painful hemorrhoids.  The patient has also been dealing with recurring episodes of altered mental status, cognitive decline and recurrent falls.  She is alert and oriented x3, but she is a poor historian and not able to provide much detail with regards to her symptoms.  She is accompanied by a medical assistant from the assisted living facility, but the assistant states she does not know the patient well, does take care of the patient at the facility and so was unable to provide any additional details. When the patient is asked what symptoms she is having, the patient states that her "stomach hurts from the cologne and perfume".  When asked to clarify if this is nausea not abdominal pain, she states yes. When asked if other things cause nausea, she says yes, but can't think of what those things are. She says she vomits 'all day and all night'.  She reports decreased appetite and difficulty chewing because of dental problems.  She states her weight goes up and down.  Review of the medical record indicates that her weight has been steadily increasing since April when she weighed 88 pounds.  Currently she weighs 102 pounds. When asked about her bowel habits, the patient states that she has diarrhea "all day and all night".  It appears she usually does not make it to a toilet in time.  She is not able to give a number in terms of the number of bowel movements she has in a day.  Of note, the patient was incontinent of feces during the clinic visit and had to be cleaned and changed.  The patient is prescribed Miralax daily and senna PRN at her facility. Her most bothersome symptom is perianal pain.  It hurts for her just to sit and also hurts when passing stool and  with wiping.  She sees blood frequently with wiping.  She was noted to be in discomfort while sitting during the encounter. Of note, she had a palliative care encounter in May in which she stated her goals of care were to maximize quality of life and symptom management.   Past Medical History:  Diagnosis Date   Ambulates with cane    Constipation    Depression    ESRD (end stage renal disease) ([place])    a. TTS Dialysis.   GERD (gastroesophageal reflux disease)    History of stress test    a. 01/2003 MV: EF 74%, no ischemia/infarct.   Hyperlipidemia    Hypertension    Osteoporosis    Stroke [facility]) 04-01-11   left frontal subcortical, saw Dr. [name]    Syncope 11/2019   TIA (transient ischemic attack) 03-12-11   Tobacco abuse    Tricuspid regurgitation    a. 05/2016 Echo: EF 65-70%, Gr2DD, mild MR, nl RV fxn, Triv TR, PASP 33mHg; b. 05/2018 Echo: EF 60-65%, Gr2DD, mild MR/TR, RVSP/PASP 350mg; c. 11/2019 Echo: EF 55-60%, no rwma, mild-mod MR, Sev TR w/ RV dilatation (CTA chest neg for PE).   [place]   Vitamin D deficiency      Past Surgical History:  Procedure Laterality Date   AV FISTULA PLACEMENT Left 02/21/2019   Procedure: BRACHIOCEPHALIC ARTERIOVENOUS (AV) FISTULA CREATION;  Surgeon: ClMarty HeckMD;  Location: [facility] OR;  Service: Vascular;  Laterality: Left;   BASCILIC VEIN TRANSPOSITION Left 04/11/2019   Procedure: SECOND STAGE BASILIC VEIN TRANSPOSITION LEFT ARM;  Surgeon: [name], MD;  Location: [place];  Service: Vascular;  Laterality: Left;   IR FLUORO GUIDE CV LINE RIGHT  04/29/2019   IR US GUIDE VASC ACCESS RIGHT  04/29/2019   OPEN REDUCTION INTERNAL FIXATION (ORIF) DISTAL RADIAL FRACTURE Left 01/28/2016   Procedure: OPEN REDUCTION INTERNAL FIXATION (ORIF) DISTAL RADIAL FRACTURE;  Surgeon: Iran Planas, MD;  Location: [place];  Service: Orthopedics;  Laterality: Left;   [place]   RIGHT HEART CATH N/A  12/05/2019   Procedure: RIGHT HEART CATH;  Surgeon: Nelva Bush, MD;  Location: [facility];  Service: Cardiovascular;  Laterality: N/A;   TONSILLECTOMY AND ADENOIDECTOMY     age 65   Family History  Problem Relation Age of Onset   Stroke Mother    Diabetes Mother    Kidney failure Mother    Heart failure Mother    Stroke Father    Cancer Sister        Breast- 22's   Colon cancer Maternal Grandmother    Pancreatic cancer Neg Hx    Esophageal cancer Neg Hx    Liver disease Neg Hx    Stomach cancer Neg Hx    Social History   Tobacco Use   Smoking status: Every Day    Packs/day: 0.30    Years: 32.00    Pack years: 9.60    Types: Cigarettes    Last attempt to quit: 09/23/2012    Years since quitting: 8.3    Passive exposure: Current   Smokeless tobacco: Never   Tobacco comments:    8 cigarettes/day  Vaping Use   Vaping Use: Never used  Substance Use Topics   Alcohol use: No   Drug use: No   Current Outpatient Medications  Medication Sig Dispense Refill   Amino Acids-Protein Hydrolys (FEEDING SUPPLEMENT, PRO-STAT SUGAR FREE 64,) LIQD Take 30 mLs by mouth in the morning. WILD CHERRY FLAVOR     aspirin 81 MG chewable tablet Chew 81 mg by mouth daily.     atorvastatin (LIPITOR) 40 MG tablet Take 40 mg by mouth at bedtime.      b complex-vitamin c-folic acid (NEPHRO-VITE) 0.8 MG TABS tablet Take 1 tablet by mouth at bedtime.     carvedilol (COREG) 6.25 MG tablet Take 1 tablet (6.25 mg total) by mouth 2 (two) times daily with a meal. 60 tablet 0   cloNIDine (CATAPRES) 0.1 MG tablet Take 0.1 mg by mouth every 8 (eight) hours as needed (BP>160/90).     FLUoxetine (PROZAC) 20 MG capsule Take 20 mg by mouth daily.     hydrocortisone (ANUSOL-HC) 2.5 % rectal cream Place 1 application rectally 2 (two) times daily for 7 days. 21 g 0   hydrocortisone cream 1 % Apply 1 application topically 2 (two) times daily. Perparation H     lactose free nutrition (BOOST PLUS) LIQD Take  237 mLs by mouth 3 (three) times daily with meals.     lidocaine (XYLOCAINE) 5 % ointment Apply 1 application topically as needed. 35.44 g 0   midodrine (PROAMATINE) 10 MG tablet Take 10 mg by mouth See admin instructions. Take 1 tablet (10 mg) by mouth prior to dialysis on Tuesday, Thursday, Saturday     omega-3 acid ethyl esters (LOVAZA) 1 g capsule Take 2  capsules by mouth 2 (two) times daily.     omeprazole (PRILOSEC) 40 MG capsule Take 40 mg by mouth daily before breakfast.      ondansetron (ZOFRAN) 4 MG tablet Take 4 mg by mouth every 8 (eight) hours as needed for nausea or vomiting.     polyethylene glycol powder (GLYCOLAX/MIRALAX) 17 GM/SCOOP powder Take 17 g by mouth See admin instructions. Mix 17 grams of powder into 8 ounces of fluid, stir, and drink (by mouth) once a day     SACCHAROMYCES BOULARDII PO Take 1 capsule by mouth 2 (two) times daily.     senna-docusate (SENOKOT-S) 8.6-50 MG tablet Take 1 tablet by mouth at bedtime as needed for mild constipation. 30 tablet 0   sodium bicarbonate 650 MG tablet Take 1,300 mg by mouth 2 (two) times daily.     No current facility-administered medications for this visit.   Allergies  Allergen Reactions   Hydrocodone Nausea And Vomiting and Other (See Comments)    "Allergic," per [facility]     Review of Systems: All systems reviewed and negative except where noted in HPI.    DG Chest 1 View  Result Date: 01/22/2021 CLINICAL DATA:  Fall hypoxia EXAM: CHEST  1 VIEW COMPARISON:  10/13/2020 FINDINGS: Cardiomegaly with aortic atherosclerosis. No focal opacity or pleural effusion. No pneumothorax. IMPRESSION: No active disease.  Cardiomegaly Electronically Signed   By: Donavan Foil M.D.   On: 01/22/2021 18:57   DG Thoracic Spine 2 View  Result Date: 01/22/2021 CLINICAL DATA:  Fall EXAM: THORACIC SPINE 2 VIEWS COMPARISON:  Thoracic spine radiograph 02/29/2020 FINDINGS: There is no radiographically evident thoracic spine fracture. Mild multilevel  degenerative changes. IMPRESSION: No radiographically evident thoracic spine fracture. Mild multilevel degenerative changes. Electronically Signed   By: Maurine Simmering   On: 01/22/2021 18:58    Physical Exam: BP 130/80   Pulse 71   Ht '4\' 6"'$  (1.372 m)   Wt 102 lb 3.2 oz (46.4 kg)   LMP 03/06/2013 (LMP Unknown)   SpO2 95%   BMI 24.64 kg/m  Constitutional: Pleasant,well-developed, chronically ill appearing female in no acute distress, accompanied by assistant from living facility; uses walker for ambulation; needs assistance walking short distances, able to stand indepdently HEENT: Normocephalic and atraumatic. Conjunctivae are normal. No scleral icterus. Cardiovascular: Normal rate, regular rhythm.  3/6 systolic murmur at right upper sternal border; left AV fistula with palpable thrill, covered in bandage (not removed) Pulmonary/chest: Effort normal and breath sounds normal. No wheezing, rales or rhonchi. Abdominal: Soft, nondistended, nontender. Bowel sounds active throughout. There are no masses palpable. No hepatomegaly. Extremities: 1+ pitting edema to mid tibias bilaterally Neurological: Alert and oriented to person place and time.   Skin: Skin appears bronzed, skin is warm and dry. No rashes noted. Psychiatric: Affect is flat and patient's responses are very brief and generalized, lacking detail.  Behavior is normal Rectal:  The skin around the anus appears moist and macerated, with areas of skin breakdown/ulceration.  External hemorrhoids present, soft and not thrombosed.  A digital rectal exam was not performed due to patient discomfort   CBC    Component Value Date/Time   WBC 4.6 10/15/2020 0509   RBC 3.51 (L) 10/15/2020 0509   HGB 10.4 (L) 10/15/2020 0509   HCT 34.4 (L) 10/15/2020 0509   PLT 204 10/15/2020 0509   MCV 98.0 10/15/2020 0509   MCH 29.6 10/15/2020 0509   MCHC 30.2 10/15/2020 0509   RDW 17.8 (H) 10/15/2020 [id number]  LYMPHSABS 1.2 10/15/2020 0509   MONOABS 0.6  10/15/2020 0509   EOSABS 0.1 10/15/2020 0509   BASOSABS 0.1 10/15/2020 0509    CMP     Component Value Date/Time   NA 137 10/15/2020 0509   K 4.6 10/15/2020 0509   CL 102 10/15/2020 0509   CO2 27 10/15/2020 0509   GLUCOSE 128 (H) 10/15/2020 0509   BUN 16 10/15/2020 0509   CREATININE 3.51 (H) 10/15/2020 0509   CREATININE 0.56 12/20/2012 0906   CALCIUM 9.0 10/15/2020 0509   CALCIUM 13.2 (HH) 11/09/2016 0719   PROT 5.8 (L) 10/15/2020 0509   ALBUMIN 2.5 (L) 10/15/2020 0509   AST 30 10/15/2020 0509   ALT 24 10/15/2020 0509   ALKPHOS 85 10/15/2020 0509   BILITOT 0.9 10/15/2020 0509   GFRNONAA 14 (L) 10/15/2020 0509   GFRAA 24 (L) 02/28/2020 2027     ASSESSMENT AND PLAN: 60 year old female with end-stage renal disease on dialysis and overall poor functional status with concern for neurocognitive decline and multiple falls with multiple GI symptoms to include nausea, abdominal pain and chronic diarrhea with fecal urgency and incontinence with significant perianal pain and bleeding.  Her perianal skin appears very irritated and tender and is most likely from chronic exposure to fecal material.  The patient reports she is having diarrhea "day and night and does not make to a bathroom most the time.  She is given MiraLAX daily currently.  It is unclear to me if this is just diarrhea with incontinence, or if the patient has obstipation with overflow incontinence.  The patient was not able to give me a very good history or details about what her bowel habits have historically been like.   For now, I recommended that she stop taking MiraLAX and see how her bowel movements do and if she is having trouble with constipation then to use Metamucil to improve her stool bulk.  We can also get a KUB, and if the KUB is suggestive of significant colonic stool burden, then it may be that a more aggressive bowel regiment to include enemas will be necessary to reduce the overflow diarrhea and the incontinence.   Her nausea is likely multifactorial, and could be related to her end-stage renal disease, polypharmacy or possibly constipation.  Her perianal pain is almost certainly related to the incontinence and skin exposure to fecal material.  This should improve if the incontinence is able to improve.  She does not need any specific therapy for hemorrhoids  Diarrhea, incontinence - Stop MiraLAX - Use Metamucil for constipation and improve stool bulk - KUB to assess colonic stool burden/obstipation as cause of diarrhea/incontinence - If no evidence of obstipation, would recommend trial of loperamide to reduce diarrhea and incontinence  Perianal pain/bleeding - Secondary to skin breakdown and irritation from exposure to fecal material - Treat with barrier cream - Address diarrhea and incontinence as above  I spent a total of 45 minutes reviewing the patient's medical record, interviewing and examining the patient, discussing her diagnosis and management of her condition going forward, and documenting in the medical record

## 2021-01-24 ENCOUNTER — Telehealth: Payer: Self-pay

## 2021-01-24 ENCOUNTER — Other Ambulatory Visit: Payer: Self-pay

## 2021-01-24 DIAGNOSIS — R159 Full incontinence of feces: Secondary | ICD-10-CM

## 2021-01-24 DIAGNOSIS — R197 Diarrhea, unspecified: Secondary | ICD-10-CM

## 2021-01-24 LAB — TSH+T3+FREE T4+T3 FREE
Free T-3: 2.1 pg/mL
Free T4 by Dialysis: 2 ng/dL — ABNORMAL HIGH
TSH: 7.1 uU/mL — ABNORMAL HIGH
Triiodothyronine (T-3), Serum: 84 ng/dL

## 2021-01-24 NOTE — Telephone Encounter (Signed)
Contacted patient facility and scheduled for patient to get abdominal X-ray tomorrow. Kianna from [facility] states stool studies and labs could be done there.

## 2021-01-25 ENCOUNTER — Emergency Department (HOSPITAL_COMMUNITY): Payer: Medicare Other

## 2021-01-25 ENCOUNTER — Inpatient Hospital Stay (HOSPITAL_COMMUNITY): Payer: Medicare Other

## 2021-01-25 ENCOUNTER — Other Ambulatory Visit: Payer: Self-pay

## 2021-01-25 ENCOUNTER — Inpatient Hospital Stay (HOSPITAL_COMMUNITY)
Admission: EM | Admit: 2021-01-25 | Discharge: 2021-02-01 | DRG: 871 | Disposition: A | Discharge status: Skilled Nursing Facility | Payer: Medicare Other | Attending: Internal Medicine | Admitting: Internal Medicine

## 2021-01-25 ENCOUNTER — Encounter (HOSPITAL_COMMUNITY): Payer: Self-pay

## 2021-01-25 DIAGNOSIS — R64 Cachexia: Secondary | ICD-10-CM | POA: Diagnosis present

## 2021-01-25 DIAGNOSIS — E876 Hypokalemia: Secondary | ICD-10-CM | POA: Diagnosis present

## 2021-01-25 DIAGNOSIS — A419 Sepsis, unspecified organism: Secondary | ICD-10-CM | POA: Diagnosis not present

## 2021-01-25 DIAGNOSIS — I5032 Chronic diastolic (congestive) heart failure: Secondary | ICD-10-CM | POA: Diagnosis present

## 2021-01-25 DIAGNOSIS — E43 Unspecified severe protein-calorie malnutrition: Secondary | ICD-10-CM | POA: Diagnosis present

## 2021-01-25 DIAGNOSIS — D638 Anemia in other chronic diseases classified elsewhere: Secondary | ICD-10-CM | POA: Diagnosis present

## 2021-01-25 DIAGNOSIS — K652 Spontaneous bacterial peritonitis: Secondary | ICD-10-CM

## 2021-01-25 DIAGNOSIS — Z885 Allergy status to narcotic agent status: Secondary | ICD-10-CM

## 2021-01-25 DIAGNOSIS — M810 Age-related osteoporosis without current pathological fracture: Secondary | ICD-10-CM | POA: Diagnosis present

## 2021-01-25 DIAGNOSIS — Z8673 Personal history of transient ischemic attack (TIA), and cerebral infarction without residual deficits: Secondary | ICD-10-CM

## 2021-01-25 DIAGNOSIS — J9601 Acute respiratory failure with hypoxia: Secondary | ICD-10-CM | POA: Diagnosis present

## 2021-01-25 DIAGNOSIS — Z833 Family history of diabetes mellitus: Secondary | ICD-10-CM

## 2021-01-25 DIAGNOSIS — Z841 Family history of disorders of kidney and ureter: Secondary | ICD-10-CM

## 2021-01-25 DIAGNOSIS — E872 Acidosis, unspecified: Secondary | ICD-10-CM | POA: Diagnosis present

## 2021-01-25 DIAGNOSIS — I313 Pericardial effusion (noninflammatory): Secondary | ICD-10-CM | POA: Diagnosis present

## 2021-01-25 DIAGNOSIS — G928 Other toxic encephalopathy: Secondary | ICD-10-CM | POA: Diagnosis present

## 2021-01-25 DIAGNOSIS — Y92811 Bus as the place of occurrence of the external cause: Secondary | ICD-10-CM | POA: Diagnosis not present

## 2021-01-25 DIAGNOSIS — F17210 Nicotine dependence, cigarettes, uncomplicated: Secondary | ICD-10-CM | POA: Diagnosis present

## 2021-01-25 DIAGNOSIS — A4151 Sepsis due to Escherichia coli [E. coli]: Secondary | ICD-10-CM | POA: Diagnosis present

## 2021-01-25 DIAGNOSIS — M898X9 Other specified disorders of bone, unspecified site: Secondary | ICD-10-CM | POA: Diagnosis present

## 2021-01-25 DIAGNOSIS — R188 Other ascites: Secondary | ICD-10-CM | POA: Diagnosis present

## 2021-01-25 DIAGNOSIS — Z8781 Personal history of (healed) traumatic fracture: Secondary | ICD-10-CM

## 2021-01-25 DIAGNOSIS — Z79899 Other long term (current) drug therapy: Secondary | ICD-10-CM

## 2021-01-25 DIAGNOSIS — E1122 Type 2 diabetes mellitus with diabetic chronic kidney disease: Secondary | ICD-10-CM | POA: Diagnosis present

## 2021-01-25 DIAGNOSIS — I10 Essential (primary) hypertension: Secondary | ICD-10-CM | POA: Diagnosis present

## 2021-01-25 DIAGNOSIS — Z7189 Other specified counseling: Secondary | ICD-10-CM | POA: Diagnosis not present

## 2021-01-25 DIAGNOSIS — Z515 Encounter for palliative care: Secondary | ICD-10-CM | POA: Diagnosis not present

## 2021-01-25 DIAGNOSIS — E785 Hyperlipidemia, unspecified: Secondary | ICD-10-CM | POA: Diagnosis present

## 2021-01-25 DIAGNOSIS — D696 Thrombocytopenia, unspecified: Secondary | ICD-10-CM | POA: Diagnosis present

## 2021-01-25 DIAGNOSIS — E039 Hypothyroidism, unspecified: Secondary | ICD-10-CM | POA: Diagnosis present

## 2021-01-25 DIAGNOSIS — W07XXXA Fall from chair, initial encounter: Secondary | ICD-10-CM | POA: Diagnosis present

## 2021-01-25 DIAGNOSIS — Z20822 Contact with and (suspected) exposure to covid-19: Secondary | ICD-10-CM | POA: Diagnosis present

## 2021-01-25 DIAGNOSIS — Z992 Dependence on renal dialysis: Secondary | ICD-10-CM

## 2021-01-25 DIAGNOSIS — Z8249 Family history of ischemic heart disease and other diseases of the circulatory system: Secondary | ICD-10-CM

## 2021-01-25 DIAGNOSIS — G9341 Metabolic encephalopathy: Secondary | ICD-10-CM

## 2021-01-25 DIAGNOSIS — R1084 Generalized abdominal pain: Secondary | ICD-10-CM

## 2021-01-25 DIAGNOSIS — F32A Depression, unspecified: Secondary | ICD-10-CM | POA: Diagnosis present

## 2021-01-25 DIAGNOSIS — Z66 Do not resuscitate: Secondary | ICD-10-CM | POA: Diagnosis present

## 2021-01-25 DIAGNOSIS — I071 Rheumatic tricuspid insufficiency: Secondary | ICD-10-CM | POA: Diagnosis present

## 2021-01-25 DIAGNOSIS — D631 Anemia in chronic kidney disease: Secondary | ICD-10-CM | POA: Diagnosis present

## 2021-01-25 DIAGNOSIS — I5033 Acute on chronic diastolic (congestive) heart failure: Secondary | ICD-10-CM | POA: Diagnosis present

## 2021-01-25 DIAGNOSIS — I132 Hypertensive heart and chronic kidney disease with heart failure and with stage 5 chronic kidney disease, or end stage renal disease: Secondary | ICD-10-CM | POA: Diagnosis present

## 2021-01-25 DIAGNOSIS — N2581 Secondary hyperparathyroidism of renal origin: Secondary | ICD-10-CM | POA: Diagnosis present

## 2021-01-25 DIAGNOSIS — E1165 Type 2 diabetes mellitus with hyperglycemia: Secondary | ICD-10-CM | POA: Diagnosis present

## 2021-01-25 DIAGNOSIS — K219 Gastro-esophageal reflux disease without esophagitis: Secondary | ICD-10-CM | POA: Diagnosis present

## 2021-01-25 DIAGNOSIS — R4182 Altered mental status, unspecified: Secondary | ICD-10-CM | POA: Diagnosis present

## 2021-01-25 DIAGNOSIS — Z1612 Extended spectrum beta lactamase (ESBL) resistance: Secondary | ICD-10-CM | POA: Diagnosis present

## 2021-01-25 DIAGNOSIS — N186 End stage renal disease: Secondary | ICD-10-CM | POA: Diagnosis present

## 2021-01-25 DIAGNOSIS — Z7982 Long term (current) use of aspirin: Secondary | ICD-10-CM

## 2021-01-25 DIAGNOSIS — E1129 Type 2 diabetes mellitus with other diabetic kidney complication: Secondary | ICD-10-CM | POA: Diagnosis present

## 2021-01-25 DIAGNOSIS — Z823 Family history of stroke: Secondary | ICD-10-CM

## 2021-01-25 LAB — CBC WITH DIFFERENTIAL/PLATELET
Abs Immature Granulocytes: 0.04 10*3/uL (ref 0.00–0.07)
Abs Immature Granulocytes: 0.06 10*3/uL (ref 0.00–0.07)
Basophils Absolute: 0 10*3/uL (ref 0.0–0.1)
Basophils Absolute: 0 10*3/uL (ref 0.0–0.1)
Basophils Relative: 0 %
Basophils Relative: 0 %
Eosinophils Absolute: 0 10*3/uL (ref 0.0–0.5)
Eosinophils Absolute: 0 10*3/uL (ref 0.0–0.5)
Eosinophils Relative: 0 %
Eosinophils Relative: 0 %
HCT: 37.3 % (ref 36.0–46.0)
HCT: 33.9 % — ABNORMAL LOW (ref 36.0–46.0)
Hemoglobin: 11 g/dL — ABNORMAL LOW (ref 12.0–15.0)
Hemoglobin: 9.8 g/dL — ABNORMAL LOW (ref 12.0–15.0)
Immature Granulocytes: 1 %
Immature Granulocytes: 1 %
Lymphocytes Relative: 4 %
Lymphocytes Relative: 3 %
Lymphs Abs: 0.3 10*3/uL — ABNORMAL LOW (ref 0.7–4.0)
Lymphs Abs: 0.3 10*3/uL — ABNORMAL LOW (ref 0.7–4.0)
MCH: 27.7 pg (ref 26.0–34.0)
MCH: 27.8 pg (ref 26.0–34.0)
MCHC: 29.5 g/dL — ABNORMAL LOW (ref 30.0–36.0)
MCHC: 28.9 g/dL — ABNORMAL LOW (ref 30.0–36.0)
MCV: 94 fL (ref 80.0–100.0)
MCV: 96 fL (ref 80.0–100.0)
Monocytes Absolute: 0.1 10*3/uL (ref 0.1–1.0)
Monocytes Absolute: 0.3 10*3/uL (ref 0.1–1.0)
Monocytes Relative: 2 %
Monocytes Relative: 3 %
Neutro Abs: 5.5 10*3/uL (ref 1.7–7.7)
Neutro Abs: 7.9 10*3/uL — ABNORMAL HIGH (ref 1.7–7.7)
Neutrophils Relative %: 93 %
Neutrophils Relative %: 93 %
Platelets: UNDETERMINED 10*3/uL (ref 150–400)
Platelets: 88 10*3/uL — ABNORMAL LOW (ref 150–400)
RBC: 3.97 MIL/uL (ref 3.87–5.11)
RBC: 3.53 MIL/uL — ABNORMAL LOW (ref 3.87–5.11)
RDW: 21.6 % — ABNORMAL HIGH (ref 11.5–15.5)
RDW: 21.9 % — ABNORMAL HIGH (ref 11.5–15.5)
Smear Review: UNDETERMINED
WBC: 5.9 10*3/uL (ref 4.0–10.5)
WBC: 8.5 10*3/uL (ref 4.0–10.5)
nRBC: 0 % (ref 0.0–0.2)
nRBC: 0 % (ref 0.0–0.2)

## 2021-01-25 LAB — COMPREHENSIVE METABOLIC PANEL
ALT: 17 U/L (ref 0–44)
AST: 33 U/L (ref 15–41)
Albumin: 3.1 g/dL — ABNORMAL LOW (ref 3.5–5.0)
Alkaline Phosphatase: 91 U/L (ref 38–126)
Anion gap: 11 (ref 5–15)
BUN: 8 mg/dL (ref 6–20)
CO2: 31 mmol/L (ref 22–32)
Calcium: 8.7 mg/dL — ABNORMAL LOW (ref 8.9–10.3)
Chloride: 96 mmol/L — ABNORMAL LOW (ref 98–111)
Creatinine, Ser: 2.02 mg/dL — ABNORMAL HIGH (ref 0.44–1.00)
GFR, Estimated: 28 mL/min — ABNORMAL LOW (ref >60)
Glucose, Bld: 173 mg/dL — ABNORMAL HIGH (ref 70–99)
Potassium: 2.7 mmol/L — CL (ref 3.5–5.1)
Sodium: 138 mmol/L (ref 135–145)
Total Bilirubin: 1.8 mg/dL — ABNORMAL HIGH (ref 0.3–1.2)
Total Protein: 6.4 g/dL — ABNORMAL LOW (ref 6.5–8.1)

## 2021-01-25 LAB — BLOOD CULTURE ID PANEL (REFLEXED) - BCID2

## 2021-01-25 LAB — URINALYSIS, ROUTINE W REFLEX MICROSCOPIC
Bilirubin Urine: NEGATIVE
Glucose, UA: NEGATIVE mg/dL
Ketones, ur: NEGATIVE mg/dL
Nitrite: NEGATIVE
Protein, ur: >=300 mg/dL — AB
RBC / HPF: >50 RBC/hpf — ABNORMAL HIGH (ref 0–5)
Specific Gravity, Urine: 1.013 (ref 1.005–1.030)
WBC, UA: >50 WBC/hpf — ABNORMAL HIGH (ref 0–5)
pH: 7 (ref 5.0–8.0)

## 2021-01-25 LAB — PHOSPHORUS: Phosphorus: 2.3 mg/dL — ABNORMAL LOW (ref 2.5–4.6)

## 2021-01-25 LAB — SARS CORONAVIRUS 2 (TAT 6-24 HRS): SARS Coronavirus 2: NEGATIVE

## 2021-01-25 LAB — BASIC METABOLIC PANEL
Anion gap: 14 (ref 5–15)
BUN: 13 mg/dL (ref 6–20)
CO2: 27 mmol/L (ref 22–32)
Calcium: 8.9 mg/dL (ref 8.9–10.3)
Chloride: 98 mmol/L (ref 98–111)
Creatinine, Ser: 2.18 mg/dL — ABNORMAL HIGH (ref 0.44–1.00)
GFR, Estimated: 25 mL/min — ABNORMAL LOW (ref >60)
Glucose, Bld: 105 mg/dL — ABNORMAL HIGH (ref 70–99)
Potassium: 3.1 mmol/L — ABNORMAL LOW (ref 3.5–5.1)
Sodium: 139 mmol/L (ref 135–145)

## 2021-01-25 LAB — LACTIC ACID, PLASMA
Lactic Acid, Venous: 2.6 mmol/L (ref 0.5–1.9)
Lactic Acid, Venous: 3 mmol/L (ref 0.5–1.9)

## 2021-01-25 LAB — PROTIME-INR
INR: 1.5 — ABNORMAL HIGH (ref 0.8–1.2)
Prothrombin Time: 17.8 seconds — ABNORMAL HIGH (ref 11.4–15.2)

## 2021-01-25 LAB — APTT: aPTT: 33 seconds (ref 24–36)

## 2021-01-25 LAB — MAGNESIUM: Magnesium: 1.7 mg/dL (ref 1.7–2.4)

## 2021-01-25 LAB — AMMONIA: Ammonia: 34 umol/L (ref 9–35)

## 2021-01-25 MED ORDER — ACETAMINOPHEN 325 MG PO TABS
650.0000 mg | ORAL_TABLET | Freq: Four times a day (QID) | ORAL | Status: DC | PRN
  Administered 2021-01-28 – 2021-01-31 (×3): 650 mg via ORAL
  Filled 2021-01-25 (×3): qty 2

## 2021-01-25 MED ORDER — HYDRALAZINE HCL 20 MG/ML IJ SOLN: 5.0000 mg | Freq: Four times a day (QID) | INTRAMUSCULAR | Status: DC | PRN

## 2021-01-25 MED ORDER — SODIUM CHLORIDE 0.9 % IV SOLN
1.0000 g | Freq: Once | INTRAVENOUS | Status: AC
  Administered 2021-01-25: 1 g via INTRAVENOUS
  Filled 2021-01-25: qty 1

## 2021-01-25 MED ORDER — LEVOTHYROXINE SODIUM 25 MCG PO TABS: 25.0000 ug | ORAL_TABLET | Freq: Every day | ORAL | Status: DC

## 2021-01-25 MED ORDER — ATORVASTATIN CALCIUM 40 MG PO TABS
40.0000 mg | ORAL_TABLET | Freq: Every day | ORAL | Status: DC
  Administered 2021-01-26 – 2021-02-01 (×7): 40 mg via ORAL
  Filled 2021-01-25 (×7): qty 1

## 2021-01-25 MED ORDER — SODIUM CHLORIDE 0.9 % IV SOLN
500.0000 mg | INTRAVENOUS | Status: DC
  Administered 2021-01-26 – 2021-01-30 (×5): 500 mg via INTRAVENOUS
  Filled 2021-01-25 (×7): qty 0.5

## 2021-01-25 MED ORDER — SODIUM CHLORIDE 0.9% FLUSH: 3.0000 mL | INTRAVENOUS | Status: DC | PRN

## 2021-01-25 MED ORDER — ACETAMINOPHEN 650 MG RE SUPP: 650.0000 mg | Freq: Four times a day (QID) | RECTAL | Status: DC | PRN

## 2021-01-25 MED ORDER — SODIUM BICARBONATE 650 MG PO TABS
1300.0000 mg | ORAL_TABLET | Freq: Two times a day (BID) | ORAL | Status: DC
  Administered 2021-01-26 – 2021-02-01 (×13): 1300 mg via ORAL
  Filled 2021-01-25 (×13): qty 2

## 2021-01-25 MED ORDER — SODIUM CHLORIDE 0.9% FLUSH
3.0000 mL | Freq: Two times a day (BID) | INTRAVENOUS | Status: DC
  Administered 2021-01-25: 3 mL via INTRAVENOUS

## 2021-01-25 MED ORDER — INSULIN ASPART 100 UNIT/ML IJ SOLN
0.0000 [IU] | Freq: Three times a day (TID) | INTRAMUSCULAR | Status: DC
  Administered 2021-01-27 – 2021-01-28 (×4): 1 [IU] via SUBCUTANEOUS
  Administered 2021-01-30: 2 [IU] via SUBCUTANEOUS
  Administered 2021-01-30: 1 [IU] via SUBCUTANEOUS

## 2021-01-25 MED ORDER — NEPRO/CARBSTEADY PO LIQD
237.0000 mL | Freq: Three times a day (TID) | ORAL | Status: DC
  Administered 2021-01-26 – 2021-02-01 (×15): 237 mL via ORAL

## 2021-01-25 MED ORDER — LACTATED RINGERS IV SOLN: INTRAVENOUS | Status: DC

## 2021-01-25 MED ORDER — METRONIDAZOLE 500 MG/100ML IV SOLN: 500.0000 mg | Freq: Three times a day (TID) | INTRAVENOUS | Status: DC

## 2021-01-25 MED ORDER — SODIUM CHLORIDE 0.9 % IV SOLN: 2.0000 g | INTRAVENOUS | Status: DC

## 2021-01-25 MED ORDER — CARVEDILOL 6.25 MG PO TABS
6.2500 mg | ORAL_TABLET | Freq: Two times a day (BID) | ORAL | Status: DC
Start: 2021-01-26 — End: 2021-01-26

## 2021-01-25 MED ORDER — PRO-STAT SUGAR FREE PO LIQD
30.0000 mL | Freq: Every morning | ORAL | Status: DC
  Filled 2021-01-25 (×8): qty 30

## 2021-01-25 MED ORDER — METRONIDAZOLE 500 MG/100ML IV SOLN
500.0000 mg | Freq: Once | INTRAVENOUS | Status: AC
  Administered 2021-01-25: 500 mg via INTRAVENOUS
  Filled 2021-01-25: qty 100

## 2021-01-25 MED ORDER — PANTOPRAZOLE SODIUM 40 MG PO TBEC
80.0000 mg | DELAYED_RELEASE_TABLET | Freq: Every day | ORAL | Status: DC
  Administered 2021-01-27 – 2021-02-01 (×6): 80 mg via ORAL
  Filled 2021-01-25 (×6): qty 2

## 2021-01-25 MED ORDER — POTASSIUM CHLORIDE 10 MEQ/100ML IV SOLN
10.0000 meq | INTRAVENOUS | Status: AC
  Administered 2021-01-25: 10 meq via INTRAVENOUS
  Filled 2021-01-25: qty 100

## 2021-01-25 MED ORDER — SODIUM CHLORIDE 0.9 % IV SOLN: 250.0000 mL | INTRAVENOUS | Status: DC | PRN

## 2021-01-25 MED ORDER — RENA-VITE PO TABS
1.0000 | ORAL_TABLET | Freq: Every day | ORAL | Status: DC
  Administered 2021-01-26 – 2021-02-01 (×7): 1 via ORAL
  Filled 2021-01-25 (×7): qty 1

## 2021-01-25 MED ORDER — SODIUM CHLORIDE 0.9 % IV BOLUS (SEPSIS)
250.0000 mL | Freq: Once | INTRAVENOUS | Status: AC
  Administered 2021-01-25: 250 mL via INTRAVENOUS

## 2021-01-25 MED ORDER — CLONIDINE HCL 0.1 MG PO TABS: 0.1000 mg | ORAL_TABLET | Freq: Three times a day (TID) | ORAL | Status: DC | PRN

## 2021-01-25 MED ORDER — OMEGA-3-ACID ETHYL ESTERS 1 G PO CAPS
2.0000 | ORAL_CAPSULE | Freq: Two times a day (BID) | ORAL | Status: DC
  Administered 2021-01-26 – 2021-02-01 (×10): 2 g via ORAL
  Filled 2021-01-25 (×13): qty 2

## 2021-01-25 MED ORDER — FLUOXETINE HCL 20 MG PO CAPS
20.0000 mg | ORAL_CAPSULE | Freq: Every day | ORAL | Status: DC
  Administered 2021-01-27 – 2021-02-01 (×6): 20 mg via ORAL
  Filled 2021-01-25 (×6): qty 1

## 2021-01-25 MED ORDER — SODIUM CHLORIDE 0.9 % IV SOLN
2.0000 g | Freq: Once | INTRAVENOUS | Status: AC
  Administered 2021-01-25: 2 g via INTRAVENOUS
  Filled 2021-01-25: qty 2

## 2021-01-25 NOTE — ED Notes (Signed)
Dr. Tamera Punt notified of hypokalemia

## 2021-01-25 NOTE — ED Notes (Signed)
Pt transported to CT ?

## 2021-01-25 NOTE — ED Provider Notes (Signed)
[place] EMERGENCY DEPARTMENT Provider Note   CSN: [id number] Arrival date & time: 01/25/21  [id number]     History Chief Complaint  Patient presents with   Abdominal Pain    [name] Michael is a 60 y.o. female.  Patient is a 60 year old female with a history of hypertension, hyperlipidemia, diabetes, end-stage renal disease on dialysis Tuesday Thursday Saturday who presents with altered mental status and abdominal pain.  She does have a history of chronic nausea vomiting diarrhea and recently saw GI 2 days ago.  Per nursing home report, she had some increased abdominal pain and distention that started yesterday and today it was noticed that she had some redness to her abdomen.  She has become more lethargic than normal.  Her oxygen saturations have been down into the 80s.  It improved to the 90s on nasal cannula at 4 L/min.  Per prior notes, patient normally uses a walker for ambulation.  Currently she has limited communication.  History is limited due to this.  Of note, she does have a DNR with her.      Past Medical History:  Diagnosis Date   Ambulates with cane    Constipation    Depression    ESRD (end stage renal disease) ([place])    a. TTS Dialysis.   GERD (gastroesophageal reflux disease)    History of stress test    a. 01/2003 MV: EF 74%, no ischemia/infarct.   Hyperlipidemia    Hypertension    Osteoporosis    Stroke [facility]) 04-01-11   left frontal subcortical, saw Dr. [name]    Syncope 11/2019   TIA (transient ischemic attack) 03-12-11   Tobacco abuse    Tricuspid regurgitation    a. 05/2016 Echo: EF 65-70%, Gr2DD, mild MR, nl RV fxn, Triv TR, PASP 32mHg; b. 05/2018 Echo: EF 60-65%, Gr2DD, mild MR/TR, RVSP/PASP 379mg; c. 11/2019 Echo: EF 55-60%, no rwma, mild-mod MR, Sev TR w/ RV dilatation (CTA chest neg for PE).   Type II diabetes mellitus ([facility] [place]   Vitamin D deficiency     Patient Active Problem List   Diagnosis Date Noted   Lymphocytopenia 10/13/2020    Ear hematoma, right, initial encounter 10/13/2020   Prolonged QT interval 10/13/2020   Lactic acidosis 10/05/2020   Transaminitis 10/05/2020   Acute respiratory failure with hypoxia ([facility] [place]04/15/2022   Altered mental status 030000000 Acute metabolic encephalopathy [id number] Severe tricuspid regurgitation 12/04/2019   ESRD (end stage renal disease) ([facility]   GERD (gastroesophageal reflux disease)    Hypertension    Type II diabetes mellitus ([facility]   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   AMS (altered mental status) 04/28/2019   Hypoxemia 04/28/2019   Fall 06/09/2018   Fall at home, initial encounter 06/09/2018   Anemia of chronic disease 06/09/2018   Syncope and collapse 06/09/2018   Acute encephalopathy 03/10/2018   Hypermagnesemia 03/10/2018   Acute lower UTI 11/09/2016   Uncontrolled type 2 diabetes mellitus with complication ([facility]   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 associated with diabetes mellitus due to underlying condition ([facility]   Chronic diastolic CHF (congestive heart failure) ([facility]   Proliferative diabetic retinopathy ([facility]11/12/2015   Poor social situation 03/09/2013   Depression 03/01/2013   Abnormal mammogram 12/20/2012   Retinal detachment 11/17/2012   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 ([id number] Hyperlipidemia 02/09/2007   Essential hypertension 02/09/2007  Past Surgical History:  Procedure Laterality Date   AV FISTULA PLACEMENT Left 02/21/2019   Procedure: BRACHIOCEPHALIC ARTERIOVENOUS (AV) FISTULA CREATION;  Surgeon: [name], MD;  Location: [facility];  Service: Vascular;  Laterality: Left;   [place] Left 04/11/2019   Procedure: SECOND STAGE BASILIC VEIN TRANSPOSITION LEFT ARM;  Surgeon: [name], MD;  Location: [place];  Service: Vascular;  Laterality: Left;   IR FLUORO GUIDE CV LINE RIGHT  04/29/2019   IR US GUIDE VASC ACCESS RIGHT  04/29/2019    OPEN REDUCTION INTERNAL FIXATION (ORIF) DISTAL RADIAL FRACTURE Left 01/28/2016   Procedure: OPEN REDUCTION INTERNAL FIXATION (ORIF) DISTAL RADIAL FRACTURE;  Surgeon: Iran Planas, MD;  Location: [place];  Service: Orthopedics;  Laterality: Left;   [place]   RIGHT HEART CATH N/A 12/05/2019   Procedure: RIGHT HEART CATH;  Surgeon: Nelva Bush, MD;  Location: [place] CV [facility];  Service: Cardiovascular;  Laterality: N/A;   TONSILLECTOMY AND ADENOIDECTOMY     age 63     OB History   No obstetric history on file.     Family History  Problem Relation Age of Onset   Stroke Mother    Diabetes Mother    Kidney failure Mother    Heart failure Mother    Stroke Father    Cancer Sister        Breast- 68's   Colon cancer Maternal Grandmother    Pancreatic cancer Neg Hx    Esophageal cancer Neg Hx    Liver disease Neg Hx    Stomach cancer Neg Hx     Social History   Tobacco Use   Smoking status: Every Day    Packs/day: 0.30    Years: 32.00    Pack years: 9.60    Types: Cigarettes    Last attempt to quit: 09/23/2012    Years since quitting: 8.3    Passive exposure: Current   Smokeless tobacco: Never   Tobacco comments:    8 cigarettes/day  Vaping Use   Vaping Use: Never used  Substance Use Topics   Alcohol use: No   Drug use: No    Home Medications Prior to Admission medications   Medication Sig Start Date End Date Taking? Authorizing Provider  Amino Acids-Protein Hydrolys (FEEDING SUPPLEMENT, PRO-STAT SUGAR FREE 64,) LIQD Take 30 mLs by mouth in the morning. WILD CHERRY FLAVOR   Yes [provider]  aspirin 81 MG chewable tablet Chew 81 mg by mouth daily.   Yes [provider]  atorvastatin (LIPITOR) 40 MG tablet Take 40 mg by mouth at bedtime.    Yes [provider]  b complex-vitamin c-folic acid (NEPHRO-VITE) 0.8 MG TABS tablet Take 1 tablet by mouth at bedtime.   Yes [provider]  carvedilol  (COREG) 6.25 MG tablet Take 1 tablet (6.25 mg total) by mouth 2 (two) times daily with a meal. 10/09/20 01/24/23 Yes Pahwani, Einar Grad, MD  cloNIDine (CATAPRES) 0.1 MG tablet Take 0.1 mg by mouth every 8 (eight) hours as needed (BP>160/90).   Yes [provider]  FLUoxetine (PROZAC) 20 MG capsule Take 20 mg by mouth daily. 12/21/20  Yes [provider]  hydrocortisone (ANUSOL-HC) 2.5 % rectal cream Place 1 application rectally 2 (two) times daily for 7 days. 01/22/21 01/29/21 Yes Varney Biles, MD  Lido-PE-Glycerin-Petrolatum (PREPARATION H RAPID RELIEF EX) Apply topically every 6 (six) hours as needed (hemorrhoid).   Yes [provider]  midodrine (PROAMATINE) 10 MG tablet Take 10 mg by mouth See admin instructions. Take 1 tablet (10 mg) by mouth prior to dialysis on Tuesday, Thursday, Saturday   Yes [provider]  Nutritional Supplements (NOVASOURCE RENAL) LIQD Take 237 mLs by mouth with breakfast, with lunch, and with evening meal.   Yes [provider]  omega-3 acid ethyl esters (LOVAZA) 1 g capsule Take 2 capsules by mouth 2 (two) times daily. 05/28/20  Yes [provider]  omeprazole (PRILOSEC) 40 MG capsule Take 40 mg by mouth daily before breakfast.    Yes [provider]  ondansetron (ZOFRAN) 4 MG tablet Take 4 mg by mouth every 8 (eight) hours as needed for nausea or vomiting.   Yes [provider]  polyethylene glycol powder (GLYCOLAX/MIRALAX) 17 GM/SCOOP powder Take 17 g by mouth See admin instructions. Mix 17 grams of powder into 8 ounces of fluid, stir, and drink (by mouth) once a day   Yes [provider]  SACCHAROMYCES BOULARDII PO Take 1 capsule by mouth 2 (two) times daily.   Yes [provider]  senna-docusate (SENOKOT-S) 8.6-50 MG tablet Take 1 tablet by mouth at bedtime as needed for mild constipation. 06/11/18  Yes Regalado, [name], MD  sodium bicarbonate 650 MG tablet Take 1,300 mg by mouth 2 (two)  times daily.   Yes [provider]  lidocaine (XYLOCAINE) 5 % ointment Apply 1 application topically as needed. Patient not taking: Reported on 01/25/2021 01/22/21   Varney Biles, MD    Allergies    Hydrocodone  Review of Systems   Review of Systems  Unable to perform ROS: Mental status change   Physical Exam Updated Vital Signs BP (!) 146/75   Pulse 78   Temp (!) 100.7 F (38.2 C) (Rectal)   Resp 19   Ht '4\' 6"'$  (1.372 m)   Wt 46.4 kg   LMP 03/06/2013 (LMP Unknown)   SpO2 97%   BMI 24.66 kg/m   Physical Exam Constitutional:      Appearance: She is well-developed.     Comments: Sleepy but will open her eyes to verbal stimuli.  She will answer simple questions with yes or no but has limited communication  HENT:     Head: Normocephalic and atraumatic.  Eyes:     Pupils: Pupils are equal, round, and reactive to light.  Cardiovascular:     Rate and Rhythm: Normal rate and regular rhythm.     Heart sounds: Normal heart sounds.     Comments: There is some ecchymosis to her left upper chest and left arm around her dialysis fistula Pulmonary:     Effort: Pulmonary effort is normal. Tachypnea present. No respiratory distress.     Breath sounds: Rhonchi present. No wheezing or rales.  Chest:     Chest wall: No tenderness.  Abdominal:     General: Bowel sounds are normal. There is distension.     Palpations: Abdomen is soft.     Tenderness: There is abdominal tenderness. There is no guarding or rebound.     Comments: Petechiae noted throughout her abdomen  Musculoskeletal:        General: Normal range of motion.     Cervical back: Normal range of motion and neck supple.     Right lower leg: No edema.     Left lower leg: No edema.  Lymphadenopathy:     Cervical: No cervical adenopathy.  Skin:    General: Skin is warm  and dry.     Findings: No rash.  Neurological:     General: No focal deficit present.     Comments: Patient will respond to verbal stimuli and  answer simple questions but is noncommunicative.  She will follow some commands and seems to have equal grip strength    ED Results / Procedures / Treatments   Labs (all labs ordered are listed, but only abnormal results are displayed) Labs Reviewed  LACTIC ACID, PLASMA - Abnormal; Notable for the following components:      Result Value   Lactic Acid, Venous 2.6 (*)    All other components within normal limits  LACTIC ACID, PLASMA - Abnormal; Notable for the following components:   Lactic Acid, Venous 3.0 (*)    All other components within normal limits  COMPREHENSIVE METABOLIC PANEL - Abnormal; Notable for the following components:   Potassium 2.7 (*)    Chloride 96 (*)    Glucose, Bld 173 (*)    Creatinine, Ser 2.02 (*)    Calcium 8.7 (*)    Total Protein 6.4 (*)    Albumin 3.1 (*)    Total Bilirubin 1.8 (*)    GFR, Estimated 28 (*)    All other components within normal limits  CBC WITH DIFFERENTIAL/PLATELET - Abnormal; Notable for the following components:   Hemoglobin 11.0 (*)    MCHC 29.5 (*)    RDW 21.6 (*)    Lymphs Abs 0.3 (*)    All other components within normal limits  PROTIME-INR - Abnormal; Notable for the following components:   Prothrombin Time 17.8 (*)    INR 1.5 (*)    All other components within normal limits  URINALYSIS, ROUTINE W REFLEX MICROSCOPIC - Abnormal; Notable for the following components:   Color, Urine AMBER (*)    APPearance TURBID (*)    Hgb urine dipstick MODERATE (*)    Protein, ur >=300 (*)    Leukocytes,Ua LARGE (*)    RBC / HPF >50 (*)    WBC, UA >50 (*)    Bacteria, UA MANY (*)    All other components within normal limits  CULTURE, BLOOD (SINGLE)  URINE CULTURE  CULTURE, BLOOD (SINGLE)  APTT  AMMONIA  MISCELLANEOUS GENETIC TEST    EKG EKG Interpretation  Date/Time:  Friday January 25 2021 08:30:40 EDT Ventricular Rate:  77 PR Interval:  139 QRS Duration: 148 QT Interval:  469 QTC Calculation: 531 R Axis:   136 Text  Interpretation: Sinus rhythm RBBB and LPFB since last tracing no significant change Confirmed by Malvin Johns [phone]) on 01/25/2021 10:06:38 AM  Radiology CT Abdomen Pelvis Wo Contrast  Result Date: 01/25/2021 CLINICAL DATA:  Abdominal distension EXAM: CT CHEST, ABDOMEN AND PELVIS WITHOUT CONTRAST TECHNIQUE: Multidetector CT imaging of the chest, abdomen and pelvis was performed following the standard protocol without IV contrast. COMPARISON:  Pelvis radiograph, 10/13/2020. CT abdomen pelvis, 10/05/2020. FINDINGS: The examination is limited secondary to lack of intravenous contrast, and significant motion artifact-most pronounced at the level the chest. CT CHEST FINDINGS Cardiovascular: Cardiomegaly with 4-chamber cardiac enlargement. Multi vessel coronary vascular calcifications. Mediastinum/Nodes: No enlarged mediastinal, hilar, or axillary lymph nodes. Thyroid gland, trachea, and esophagus demonstrate no significant findings. Lungs/Pleura: Small volume right pleural effusion, with adjacent dependent atelectasis. Musculoskeletal: Healing left fifth anterior rib fracture CT ABDOMEN PELVIS FINDINGS Hepatobiliary: No focal liver abnormality is seen. No gallstones, gallbladder wall thickening, or biliary dilatation. Pancreas: Atrophic.  No pancreatic duct dilation. Spleen: Normal in size without  focal abnormality. Adrenals/Urinary Tract: Adrenal glands are unremarkable. Perirenal stranding. Punctate nonobstructing nephroliths within the left renal collecting system. No hydronephrosis. Bladder is unremarkable. Stomach/Bowel: Stomach is within normal limits. Appendix appears normal. No evidence of bowel wall thickening, distention, or inflammatory changes. Vascular/Lymphatic: Aortic atherosclerosis. No aneurysmal dilation. No enlarged abdominal or pelvic lymph nodes. Reproductive: Uterus and bilateral adnexa are unremarkable. Other: Moderate volume of intra-abdominal ascites, as seen along the dependent pelvis,  pericolic gutters, perihepatic and perisplenic spaces. Musculoskeletal: Diffuse subcutaneous edema. No acute osseous abnormality. IMPRESSION: Suboptimal evaluation, within this constraint; 1. No acute thoracic or abdominopelvic findings. 2. Cardiomegaly. 3. Small volume right pleural effusion with adjacent basilar consolidation, likely atelectasis though early pneumonia can appear similar. 4. Nonobstructing left nephrolithiasis.  No hydronephrosis. 5. Moderate volume intra-abdominal ascites. 6. Addition and senescent findings, as above. Aortic Atherosclerosis (ICD10-I70.0). Electronically Signed   By: Michaelle Birks MD   On: 01/25/2021 12:11   CT Head Wo Contrast  Result Date: 01/25/2021 CLINICAL DATA:  Mental status change, unknown cause EXAM: CT HEAD WITHOUT CONTRAST TECHNIQUE: Contiguous axial images were obtained from the base of the skull through the vertex without intravenous contrast. COMPARISON:  CT October 13, 2020. FINDINGS: Motion limited study.  Within this limitation: Brain: No evidence of acute large vascular territory infarction, hemorrhage, hydrocephalus, extra-axial collection or mass lesion/mass effect. Similar remote lacunar infarcts in the bilateral basal ganglia and thalami. Similar additional patchy white matter hypoattenuation, likely related to chronic microvascular ischemic disease. Vascular: No hyperdense vessel identified. Skull: No acute fracture. Sinuses/Orbits: Visualized sinuses are clear. Similar exophthalmos without acute orbital finding. Other: No sizable mastoid effusions. IMPRESSION: 1. No evidence of acute intracranial abnormality on this motion study. 2. Similar remote lacunar infarcts in bilateral basal ganglia and thalami and chronic microvascular ischemic disease. Electronically Signed   By: Margaretha Sheffield MD   On: 01/25/2021 14:20   CT Chest Wo Contrast  Result Date: 01/25/2021 CLINICAL DATA:  Abdominal distension EXAM: CT CHEST, ABDOMEN AND PELVIS WITHOUT CONTRAST  TECHNIQUE: Multidetector CT imaging of the chest, abdomen and pelvis was performed following the standard protocol without IV contrast. COMPARISON:  Pelvis radiograph, 10/13/2020. CT abdomen pelvis, 10/05/2020. FINDINGS: The examination is limited secondary to lack of intravenous contrast, and significant motion artifact-most pronounced at the level the chest. CT CHEST FINDINGS Cardiovascular: Cardiomegaly with 4-chamber cardiac enlargement. Multi vessel coronary vascular calcifications. Mediastinum/Nodes: No enlarged mediastinal, hilar, or axillary lymph nodes. Thyroid gland, trachea, and esophagus demonstrate no significant findings. Lungs/Pleura: Small volume right pleural effusion, with adjacent dependent atelectasis. Musculoskeletal: Healing left fifth anterior rib fracture CT ABDOMEN PELVIS FINDINGS Hepatobiliary: No focal liver abnormality is seen. No gallstones, gallbladder wall thickening, or biliary dilatation. Pancreas: Atrophic.  No pancreatic duct dilation. Spleen: Normal in size without focal abnormality. Adrenals/Urinary Tract: Adrenal glands are unremarkable. Perirenal stranding. Punctate nonobstructing nephroliths within the left renal collecting system. No hydronephrosis. Bladder is unremarkable. Stomach/Bowel: Stomach is within normal limits. Appendix appears normal. No evidence of bowel wall thickening, distention, or inflammatory changes. Vascular/Lymphatic: Aortic atherosclerosis. No aneurysmal dilation. No enlarged abdominal or pelvic lymph nodes. Reproductive: Uterus and bilateral adnexa are unremarkable. Other: Moderate volume of intra-abdominal ascites, as seen along the dependent pelvis, pericolic gutters, perihepatic and perisplenic spaces. Musculoskeletal: Diffuse subcutaneous edema. No acute osseous abnormality. IMPRESSION: Suboptimal evaluation, within this constraint; 1. No acute thoracic or abdominopelvic findings. 2. Cardiomegaly. 3. Small volume right pleural effusion with adjacent  basilar consolidation, likely atelectasis though early pneumonia can appear similar. 4. Nonobstructing  left nephrolithiasis.  No hydronephrosis. 5. Moderate volume intra-abdominal ascites. 6. Addition and senescent findings, as above. Aortic Atherosclerosis (ICD10-I70.0). Electronically Signed   By: Michaelle Birks MD   On: 01/25/2021 12:11   DG Chest Port 1 View  Result Date: 01/25/2021 CLINICAL DATA:  Questionable sepsis EXAM: PORTABLE CHEST 1 VIEW COMPARISON:  Three days ago FINDINGS: Chronic cardiomegaly. Low volume chest. There is no edema, consolidation, effusion, or pneumothorax. Mild atelectasis or scarring in the left mid chest. Extensive artifact from EKG leads. IMPRESSION: No focal pneumonia. Cardiomegaly. Electronically Signed   By: Monte Fantasia M.D.   On: 01/25/2021 09:24    Procedures Procedures   Medications Ordered in ED Medications  lactated ringers infusion ( Intravenous New Bag/Given 01/25/21 1113)  ceFEPIme (MAXIPIME) 2 g in sodium chloride 0.9 % 100 mL IVPB (has no administration in time range)  sodium chloride 0.9 % bolus 250 mL (0 mLs Intravenous Stopped 01/25/21 1208)  ceFEPIme (MAXIPIME) 2 g in sodium chloride 0.9 % 100 mL IVPB (0 g Intravenous Stopped 01/25/21 1242)  metroNIDAZOLE (FLAGYL) IVPB 500 mg (0 mg Intravenous Stopped 01/25/21 1212)    ED Course  I have reviewed the triage vital signs and the nursing notes.  Pertinent labs & imaging results that were available during my care of the patient were reviewed by me and considered in my medical decision making (see chart for details).    MDM Rules/Calculators/A&P                           Patient is a 60 year old female who presents with altered mental status and abdominal pain.  She is noted to be febrile on arrival with a rectal temp of 100.7.  Her white count is normal.  Her lactate was elevated.  She was started on septic protocol.  She was given IV antibiotics, blood cultures were obtained.  She was given fluids  cautiously.  She was only started on 250 cc initially due to the fact that she is a dialysis patient.  Her blood pressures been stable.  She had a CT scan of her chest abdomen pelvis.  There is no evidence of pneumonia or pulmonary edema.  There is no obvious infection noted in her abdomen.  There is still moderate amount of ascites.  She may end up needing a tap to rule out peritonitis.  Her mental status has improved during treatment in the ED.  She is answering questions a little bit better but still confused.  She was noted to have a low potassium on her blood work.  Her urine appears infected.  She was given antibiotics which should cover this.  I spoke with the hospitalist who will admit the patient for further treatment.  CRITICAL CARE Performed by: Malvin Johns Total critical care time: 60 minutes Critical care time was exclusive of separately billable procedures and treating other patients. Critical care was necessary to treat or prevent imminent or life-threatening deterioration. Critical care was time spent personally by me on the following activities: development of treatment plan with patient and/or surrogate as well as nursing, discussions with consultants, evaluation of patient's response to treatment, examination of patient, obtaining history from patient or surrogate, ordering and performing treatments and interventions, ordering and review of laboratory studies, ordering and review of radiographic studies, pulse oximetry and re-evaluation of patient's condition.  Final Clinical Impression(s) / ED Diagnoses Final diagnoses:  Sepsis without acute organ dysfunction, due to unspecified organism ([place])  Generalized abdominal pain  Hypokalemia    Rx / DC Orders ED Discharge Orders     None        Malvin Johns, MD 01/25/21 1443

## 2021-01-25 NOTE — Progress Notes (Signed)
Pharmacy Antibiotic Note  [name] Michael is a 60 y.o. female admitted on 01/25/2021 with sepsis.  Pharmacy has been consulted for cefepime dosing. Pt with Tmax 100.7 and WBC is WNL. Pt with history of ESRD on HD. Lactic acid is elevated.   Plan: Cefepime 2gm IV x 1 then 2gm QHD F/u renal plans, C&S, clinical status  Height: '4\' 6"'$  (137.2 cm) Weight: 46.4 kg (102 lb 4.7 oz) IBW/kg (Calculated) : 31.7  Temp (24hrs), Avg:99.5 F (37.5 C), Min:98.2 F (36.8 C), Max:100.7 F (38.2 C)  Recent Labs  Lab 01/25/21 0857 01/25/21 0915  WBC  --  5.9  CREATININE  --  2.02*  LATICACIDVEN 2.6*  --     Estimated Creatinine Clearance: 17.6 mL/min (A) (by C-G formula based on SCr of 2.02 mg/dL (H)).    Allergies  Allergen Reactions   Hydrocodone Nausea And Vomiting and Other (See Comments)    "Allergic," per MAR    Antimicrobials this admission: Cefepime 8/5>> Flagyl x 1 8/5  Dose adjustments this admission: N/A  Microbiology results: Pending  Thank you for allowing pharmacy to be a part of this patient's care.  Kaylla Cobos, Rande Lawman 01/25/2021 10:16 AM

## 2021-01-25 NOTE — ED Triage Notes (Signed)
Pt bib ems from [facility]; c/o abd pain, distention that started yesterday, new redness on abdomen; dialysis pt, last dialyzed Thursday; this morning lethargic, sats 80s, responds to verbal; slow to answer but answers questions appropriately; abd tender to palpation all quadrants; diminished lower lung fields; denies n/v/d; albuterol treatment pta, brought sats to 97% baseline 4L  162/76 HR 76 CBG 198 T 97.17F

## 2021-01-25 NOTE — Progress Notes (Signed)
PHARMACY - PHYSICIAN COMMUNICATION CRITICAL VALUE ALERT - BLOOD CULTURE IDENTIFICATION (BCID)  [name] Michael is an 60 y.o. female who presented to [facility] on 01/25/2021 with a chief complaint of AMS  Assessment:  2/3 BC with ESBL E coli  Name of physician (or Provider) Contacted: A Zierle-Ghosh  Current antibiotics: flagyl and cefepime  Changes to prescribed antibiotics recommended:  Chane Abx to Meropenem  Results for orders placed or performed during the hospital encounter of 01/25/21  Blood Culture ID Panel (Reflexed) (Collected: 01/25/2021  9:19 AM)  Result Value Ref Range   Enterococcus faecalis NOT DETECTED NOT DETECTED   Enterococcus Faecium NOT DETECTED NOT DETECTED   Listeria monocytogenes NOT DETECTED NOT DETECTED   Staphylococcus species NOT DETECTED NOT DETECTED   Staphylococcus aureus (BCID) NOT DETECTED NOT DETECTED   Staphylococcus epidermidis NOT DETECTED NOT DETECTED   Staphylococcus lugdunensis NOT DETECTED NOT DETECTED   Streptococcus species NOT DETECTED NOT DETECTED   Streptococcus agalactiae NOT DETECTED NOT DETECTED   Streptococcus pneumoniae NOT DETECTED NOT DETECTED   Streptococcus pyogenes NOT DETECTED NOT DETECTED   A.calcoaceticus-baumannii NOT DETECTED NOT DETECTED   Bacteroides fragilis NOT DETECTED NOT DETECTED   Enterobacterales DETECTED (A) NOT DETECTED   Enterobacter cloacae complex NOT DETECTED NOT DETECTED   Escherichia coli DETECTED (A) NOT DETECTED   Klebsiella aerogenes NOT DETECTED NOT DETECTED   Klebsiella oxytoca NOT DETECTED NOT DETECTED   Klebsiella pneumoniae NOT DETECTED NOT DETECTED   Proteus species NOT DETECTED NOT DETECTED   Salmonella species NOT DETECTED NOT DETECTED   Serratia marcescens NOT DETECTED NOT DETECTED   Haemophilus influenzae NOT DETECTED NOT DETECTED   Neisseria meningitidis NOT DETECTED NOT DETECTED   Pseudomonas aeruginosa NOT DETECTED NOT DETECTED   Stenotrophomonas maltophilia NOT DETECTED NOT DETECTED    Candida albicans NOT DETECTED NOT DETECTED   Candida auris NOT DETECTED NOT DETECTED   Candida glabrata NOT DETECTED NOT DETECTED   Candida krusei NOT DETECTED NOT DETECTED   Candida parapsilosis NOT DETECTED NOT DETECTED   Candida tropicalis NOT DETECTED NOT DETECTED   Cryptococcus neoformans/gattii NOT DETECTED NOT DETECTED   CTX-M ESBL DETECTED (A) NOT DETECTED   Carbapenem resistance IMP NOT DETECTED NOT DETECTED   Carbapenem resistance KPC NOT DETECTED NOT DETECTED   Carbapenem resistance NDM NOT DETECTED NOT DETECTED   Carbapenem resist OXA 48 LIKE NOT DETECTED NOT DETECTED   Carbapenem resistance VIM NOT DETECTED NOT DETECTED    [name] 01/25/2021  9:52 PM

## 2021-01-25 NOTE — Progress Notes (Signed)
[employer] is following Code Sepsis bundle. °

## 2021-01-25 NOTE — ED Notes (Signed)
Attempted report 

## 2021-01-25 NOTE — H&P (Addendum)
History and Physical    [name] Michael VXB:[id number] DOB: [birth year] DOA: 01/25/2021  PCP: Charlott Rakes, MD Consultants:  pulm: Dr. Erin Fulling, gastro: Dr. Fuller Plan, neuro: Dr. [name], cardiology: Dr. Harrington Challenger Patient coming from: [facility] SNF  Chief Complaint: altered mental status and abdominal distension   HPI: [name] Michael is a 60 y.o. female with medical history significant of ESRD on hemodialysis, history of CVA, hypertension, type 2 diabetes mellitus, and chronic diastolic CHF who presented to ED from her SNF with complaints of altered mental status and abdominal distention. She can't not really answer many of my questions. Is able to tell me she is on oxygen as needed at the SNF.   She states her stomach and her head hurt. Denies any N/V/D, chest pain or palpitations. She does know [place], but thinks she came to hospital due to fall. I called her sister and she doesn't know much. She states she knows she is still smoking. She last saw her on Monday and she was her normal self. No history of drinking or liver disease. All other ROS limited due to patient's altered state. She does have DNR paperwork with her and confirmed with her sister. Received her dialysis session yesterday as scheduled.   ED Course: vitals: temp: 100.7, bp: 148/62 HR: 78, RR: 19, oxygen: 97% Pertinent labs: lactic acid: 2.6-->3.0, no white count, potassium: 2.7, BUN: 8 and creatinine :2.02 (ESRD on dialysis) hgb: 11, platelets clumped, INR; 1.5,  CTH: no acute findings, CT chest/abdomen: no acute thoracic or abdo-pelvic findings. Small volume right pleural effusion with adjacent basilar consolidation, likely atelectasis vs. Early pneumonia, moderate volume intra-abdominal ascites. CXR: no focal pneumonia. Asked to admit.   Review of Systems: As per HPI; otherwise review of systems reviewed and negative.   Ambulatory Status:  wheelchair. Able to tell me she doesn't walk.     Past Medical History:   Diagnosis Date   Ambulates with cane    Constipation    Depression    ESRD (end stage renal disease) ([place])    a. TTS Dialysis.   GERD (gastroesophageal reflux disease)    History of stress test    a. 01/2003 MV: EF 74%, no ischemia/infarct.   Hyperlipidemia    Hypertension    Osteoporosis    Stroke [facility]) 04-01-11   left frontal subcortical, saw Dr. [name]    Syncope 11/2019   TIA (transient ischemic attack) 03-12-11   Tobacco abuse    Tricuspid regurgitation    a. 05/2016 Echo: EF 65-70%, Gr2DD, mild MR, nl RV fxn, Triv TR, PASP 63mHg; b. 05/2018 Echo: EF 60-65%, Gr2DD, mild MR/TR, RVSP/PASP 344mg; c. 11/2019 Echo: EF 55-60%, no rwma, mild-mod MR, Sev TR w/ RV dilatation (CTA chest neg for PE).   Type II diabetes mellitus ([place]   Vitamin D deficiency     Past Surgical History:  Procedure Laterality Date   AV FISTULA PLACEMENT Left 02/21/2019   Procedure: BRACHIOCEPHALIC ARTERIOVENOUS (AV) FISTULA CREATION;  Surgeon: ClMarty HeckMD;  [place] [facility] [place]: [place];  Laterality: Left;   [place] 04[place]   Procedure: SECOND STAGE BASILIC VEIN TRANSPOSITION LEFT ARM;  Surgeon: ClMarty HeckMD;  [place] [facility] Service: [place];  Laterality: Left;   IR FLUORO GUIDE CV LINE RIGHT  04/29/2019   IR [place] VASC ACCESS RIGHT  04/29/2019   OPEN REDUCTION INTERNAL FIXATION (ORIF) DISTAL RADIAL FRACTURE Left 01/28/2016   Procedure: OPEN  REDUCTION INTERNAL FIXATION (ORIF) DISTAL RADIAL FRACTURE;  Surgeon: Iran Planas, MD;  Location: [place];  Service: Orthopedics;  Laterality: Left;   [place]   RIGHT HEART CATH N/A 12/05/2019   Procedure: RIGHT HEART CATH;  Surgeon: Nelva Bush, MD;  Location: [place] CV LAB;  Service: Cardiovascular;  Laterality: N/A;   TONSILLECTOMY AND ADENOIDECTOMY     age 31    Social History   Socioeconomic History   Marital status: Single    Spouse name: Not on file    Number of children: Not on file   Years of education: Not on file   Highest education level: Not on file  Occupational History   Not on file  Tobacco Use   Smoking status: Every Day    Packs/day: 0.30    Years: 32.00    Pack years: 9.60    Types: Cigarettes    Last attempt to quit: 09/23/2012    Years since quitting: 8.3    Passive exposure: Current   Smokeless tobacco: Never   Tobacco comments:    8 cigarettes/day  Vaping Use   Vaping Use: Never used  Substance and Sexual Activity   Alcohol use: No   Drug use: No   Sexual activity: Not Currently    Birth control/protection: Post-menopausal  Other Topics Concern   Not on file  Social History Narrative   Lives at [employer] in [place], Skilled    Right Handed   Drinks 2-4 cups caffeine daily      Social Determinants of Health   Financial Resource Strain: Not on file  Food Insecurity: Not on file  Transportation Needs: Not on file  Physical Activity: Not on file  Stress: Not on file  Social Connections: Not on file  Intimate Partner Violence: Not on file    Allergies  Allergen Reactions   Hydrocodone Nausea And Vomiting and Other (See Comments)    "Allergic," per [facility]    Family History  Problem Relation Age of Onset   Stroke Mother    Diabetes Mother    Kidney failure Mother    Heart failure Mother    Stroke Father    Cancer Sister        Breast- 67's   Colon cancer Maternal Grandmother    Pancreatic cancer Neg Hx    Esophageal cancer Neg Hx    Liver disease Neg Hx    Stomach cancer Neg Hx     Prior to Admission medications   Medication Sig Start Date End Date Taking? Authorizing Provider  Amino Acids-Protein Hydrolys (FEEDING SUPPLEMENT, PRO-STAT SUGAR FREE 64,) LIQD Take 30 mLs by mouth in the morning. WILD CHERRY FLAVOR   Yes [provider]  aspirin 81 MG chewable tablet Chew 81 mg by mouth daily.   Yes [provider]  atorvastatin (LIPITOR) 40 MG tablet Take 40 mg by mouth at  bedtime.    Yes [provider]  b complex-vitamin c-folic acid (NEPHRO-VITE) 0.8 MG TABS tablet Take 1 tablet by mouth at bedtime.   Yes [provider]  carvedilol (COREG) 6.25 MG tablet Take 1 tablet (6.25 mg total) by mouth 2 (two) times daily with a meal. 10/09/20 01/24/23 Yes Pahwani, Einar Grad, MD  cloNIDine (CATAPRES) 0.1 MG tablet Take 0.1 mg by mouth every 8 (eight) hours as needed (BP>160/90).   Yes [provider]  FLUoxetine (PROZAC) 20 MG capsule Take 20 mg by mouth daily. 12/21/20  Yes  [provider]  hydrocortisone (ANUSOL-HC) 2.5 % rectal cream Place 1 application rectally 2 (two) times daily for 7 days. 01/22/21 01/29/21 Yes Varney Biles, MD  Lido-PE-Glycerin-Petrolatum (PREPARATION H RAPID RELIEF EX) Apply topically every 6 (six) hours as needed (hemorrhoid).   Yes [provider]  midodrine (PROAMATINE) 10 MG tablet Take 10 mg by mouth See admin instructions. Take 1 tablet (10 mg) by mouth prior to dialysis on Tuesday, Thursday, Saturday   Yes [provider]  Nutritional Supplements (NOVASOURCE RENAL) LIQD Take 237 mLs by mouth with breakfast, with lunch, and with evening meal.   Yes [provider]  omega-3 acid ethyl esters (LOVAZA) 1 g capsule Take 2 capsules by mouth 2 (two) times daily. 05/28/20  Yes [provider]  omeprazole (PRILOSEC) 40 MG capsule Take 40 mg by mouth daily before breakfast.    Yes [provider]  ondansetron (ZOFRAN) 4 MG tablet Take 4 mg by mouth every 8 (eight) hours as needed for nausea or vomiting.   Yes [provider]  polyethylene glycol powder (GLYCOLAX/MIRALAX) 17 GM/SCOOP powder Take 17 g by mouth See admin instructions. Mix 17 grams of powder into 8 ounces of fluid, stir, and drink (by mouth) once a day   Yes [provider]  SACCHAROMYCES BOULARDII PO Take 1 capsule by mouth 2 (two) times daily.   Yes [provider]  senna-docusate (SENOKOT-S)  8.6-50 MG tablet Take 1 tablet by mouth at bedtime as needed for mild constipation. 06/11/18  Yes Regalado, [name], MD  sodium bicarbonate 650 MG tablet Take 1,300 mg by mouth 2 (two) times daily.   Yes [provider]  lidocaine (XYLOCAINE) 5 % ointment Apply 1 application topically as needed. Patient not taking: Reported on 01/25/2021 01/22/21   Varney Biles, MD    Physical Exam: Vitals:   01/25/21 1400 01/25/21 1430 01/25/21 1600 01/25/21 1601  BP: (!) 146/75 (!) 168/70 (!) 160/73   Pulse: 78 80 81 80  Resp: 19 18 17 18   Temp:      TempSrc:      SpO2: 97% 98% 94% 95%  Weight:      Height:         General:  Appears calm and comfortable and is in NAD Eyes:  PERRL, EOMI, normal lids, iris. Lightly exoopholmouts eyes  ENT:  grossly normal hearing, lips & tongue, mmm; appropriate dentition Neck:  no LAD, masses or thyromegaly; no carotid bruits Cardiovascular:  RRR, no m/r/g. No LE edema.  Respiratory:   CTA bilaterally with no wheezes/rales/rhonchi.  Normal respiratory effort. Abdomen:  soft, mildly distended. No obvious fluid wave. Dull to percussion from umbilicus into lower quadrants. TTP in lower quadrants.  Back:   normal alignment. Limited exam.  Skin:  erythematous and petechial rash over abdomen, no where else on body.  Musculoskeletal:  grossly normal tone BUE/BLE, no bony abnormality Lower extremity:  No LE edema.  Limited foot exam with no ulcerations.  2+ distal pulses. Psychiatric: limited exam. Not alert to place or date. Knows herself/sister and home.  Neurologic: could not test cranial nerves. Sensation intact.    Radiological Exams on Admission: Independently reviewed - see discussion in A/P where applicable  CT Abdomen Pelvis Wo Contrast  Result Date: 01/25/2021 CLINICAL DATA:  Abdominal distension EXAM: CT CHEST, ABDOMEN AND PELVIS WITHOUT CONTRAST TECHNIQUE: Multidetector CT imaging of the chest, abdomen and pelvis was performed following the  standard protocol without IV contrast. COMPARISON:  Pelvis radiograph, 10/13/2020.  CT abdomen pelvis, 10/05/2020. FINDINGS: The examination is limited secondary to lack of intravenous contrast, and significant motion artifact-most pronounced at the level the chest. CT CHEST FINDINGS Cardiovascular: Cardiomegaly with 4-chamber cardiac enlargement. Multi vessel coronary vascular calcifications. Mediastinum/Nodes: No enlarged mediastinal, hilar, or axillary lymph nodes. Thyroid gland, trachea, and esophagus demonstrate no significant findings. Lungs/Pleura: Small volume right pleural effusion, with adjacent dependent atelectasis. Musculoskeletal: Healing left fifth anterior rib fracture CT ABDOMEN PELVIS FINDINGS Hepatobiliary: No focal liver abnormality is seen. No gallstones, gallbladder wall thickening, or biliary dilatation. Pancreas: Atrophic.  No pancreatic duct dilation. Spleen: Normal in size without focal abnormality. Adrenals/Urinary Tract: Adrenal glands are unremarkable. Perirenal stranding. Punctate nonobstructing nephroliths within the left renal collecting system. No hydronephrosis. Bladder is unremarkable. Stomach/Bowel: Stomach is within normal limits. Appendix appears normal. No evidence of bowel wall thickening, distention, or inflammatory changes. Vascular/Lymphatic: Aortic atherosclerosis. No aneurysmal dilation. No enlarged abdominal or pelvic lymph nodes. Reproductive: Uterus and bilateral adnexa are unremarkable. Other: Moderate volume of intra-abdominal ascites, as seen along the dependent pelvis, pericolic gutters, perihepatic and perisplenic spaces. Musculoskeletal: Diffuse subcutaneous edema. No acute osseous abnormality. IMPRESSION: Suboptimal evaluation, within this constraint; 1. No acute thoracic or abdominopelvic findings. 2. Cardiomegaly. 3. Small volume right pleural effusion with adjacent basilar consolidation, likely atelectasis though early pneumonia can appear similar. 4.  Nonobstructing left nephrolithiasis.  No hydronephrosis. 5. Moderate volume intra-abdominal ascites. 6. Addition and senescent findings, as above. Aortic Atherosclerosis (ICD10-I70.0). Electronically Signed   By: Michaelle Birks MD   On: 01/25/2021 12:11   CT Head Wo Contrast  Result Date: 01/25/2021 CLINICAL DATA:  Mental status change, unknown cause EXAM: CT HEAD WITHOUT CONTRAST TECHNIQUE: Contiguous axial images were obtained from the base of the skull through the vertex without intravenous contrast. COMPARISON:  CT October 13, 2020. FINDINGS: Motion limited study.  Within this limitation: Brain: No evidence of acute large vascular territory infarction, hemorrhage, hydrocephalus, extra-axial collection or mass lesion/mass effect. Similar remote lacunar infarcts in the bilateral basal ganglia and thalami. Similar additional patchy white matter hypoattenuation, likely related to chronic microvascular ischemic disease. Vascular: No hyperdense vessel identified. Skull: No acute fracture. Sinuses/Orbits: Visualized sinuses are clear. Similar exophthalmos without acute orbital finding. Other: No sizable mastoid effusions. IMPRESSION: 1. No evidence of acute intracranial abnormality on this motion study. 2. Similar remote lacunar infarcts in bilateral basal ganglia and thalami and chronic microvascular ischemic disease. Electronically Signed   By: Margaretha Sheffield MD   On: 01/25/2021 14:20   CT Chest Wo Contrast  Result Date: 01/25/2021 CLINICAL DATA:  Abdominal distension EXAM: CT CHEST, ABDOMEN AND PELVIS WITHOUT CONTRAST TECHNIQUE: Multidetector CT imaging of the chest, abdomen and pelvis was performed following the standard protocol without IV contrast. COMPARISON:  Pelvis radiograph, 10/13/2020. CT abdomen pelvis, 10/05/2020. FINDINGS: The examination is limited secondary to lack of intravenous contrast, and significant motion artifact-most pronounced at the level the chest. CT CHEST FINDINGS Cardiovascular:  Cardiomegaly with 4-chamber cardiac enlargement. Multi vessel coronary vascular calcifications. Mediastinum/Nodes: No enlarged mediastinal, hilar, or axillary lymph nodes. Thyroid gland, trachea, and esophagus demonstrate no significant findings. Lungs/Pleura: Small volume right pleural effusion, with adjacent dependent atelectasis. Musculoskeletal: Healing left fifth anterior rib fracture CT ABDOMEN PELVIS FINDINGS Hepatobiliary: No focal liver abnormality is seen. No gallstones, gallbladder wall thickening, or biliary dilatation. Pancreas: Atrophic.  No pancreatic duct dilation. Spleen: Normal in size without focal abnormality. Adrenals/Urinary Tract: Adrenal glands are unremarkable. Perirenal stranding. Punctate nonobstructing nephroliths within the left renal collecting system. No  hydronephrosis. Bladder is unremarkable. Stomach/Bowel: Stomach is within normal limits. Appendix appears normal. No evidence of bowel wall thickening, distention, or inflammatory changes. Vascular/Lymphatic: Aortic atherosclerosis. No aneurysmal dilation. No enlarged abdominal or pelvic lymph nodes. Reproductive: Uterus and bilateral adnexa are unremarkable. Other: Moderate volume of intra-abdominal ascites, as seen along the dependent pelvis, pericolic gutters, perihepatic and perisplenic spaces. Musculoskeletal: Diffuse subcutaneous edema. No acute osseous abnormality. IMPRESSION: Suboptimal evaluation, within this constraint; 1. No acute thoracic or abdominopelvic findings. 2. Cardiomegaly. 3. Small volume right pleural effusion with adjacent basilar consolidation, likely atelectasis though early pneumonia can appear similar. 4. Nonobstructing left nephrolithiasis.  No hydronephrosis. 5. Moderate volume intra-abdominal ascites. 6. Addition and senescent findings, as above. Aortic Atherosclerosis (ICD10-I70.0). Electronically Signed   By: Michaelle Birks MD   On: 01/25/2021 12:11   DG Chest Port 1 View  Result Date:  01/25/2021 CLINICAL DATA:  Questionable sepsis EXAM: PORTABLE CHEST 1 VIEW COMPARISON:  Three days ago FINDINGS: Chronic cardiomegaly. Low volume chest. There is no edema, consolidation, effusion, or pneumothorax. Mild atelectasis or scarring in the left mid chest. Extensive artifact from EKG leads. IMPRESSION: No focal pneumonia. Cardiomegaly. Electronically Signed   By: Monte Fantasia M.D.   On: 01/25/2021 09:24   [name] ASCITES (ABDOMEN LIMITED)  Result Date: 01/25/2021 CLINICAL DATA:  Assess for ascites EXAM: LIMITED ABDOMEN ULTRASOUND FOR ASCITES TECHNIQUE: Limited ultrasound survey for ascites was performed in all four abdominal quadrants. COMPARISON:  CT 01/25/2021 FINDINGS: Targeted sonography of the 4 quadrants is performed. Moderate ascites is visualized with largest pocket in the right lower quadrant. IMPRESSION: Moderate ascites Electronically Signed   By: Donavan Foil M.D.   On: 01/25/2021 17:53    EKG: Independently reviewed.  NSR with rate 77 with RBBB ad LPFB; nonspecific ST changes with no evidence of acute ischemia. Similar to previous EKG.    Labs on Admission: I have personally reviewed the available labs and imaging studies at the time of the admission.  Pertinent labs:  lactic acid: 2.6-->3.0,   white count WNL potassium: 2.7,  BUN: 8 and creatinine :2.02 (ESRD on dialysis)  hgb: 11 Platelets: 88   INR; 1.5    Assessment/Plan Principal Problem:   Acute metabolic encephalopathy Per chart review and discussing with sister she has had recurrent episodes of confusion and hospitalization. Last admit for encephalopathy was 09/2020.  Head Ct negative for acute findings Metabolic labs concerning for untreated hypothyroidism. She was started on medication at last hospital d/c but is apparently not taking. Synthroid 76mg started. Recently had a normal b12, negative RPR.  Concern for possible intra abdominal infection as source with new ascites abdominal pain and fever. See below  for plan. Continue abx with cefepime and flagyl/cultures pending/follow up on IR paracentesis ? Possible UTI as well. Culture pending, continue cefepime for now.  -she is protecting airway, but not very alert. NPO except for medication and SLP eval ordered.    Active Problems:   Abdominal ascites No history of liver disease or alcohol use per sister.  With fever, elevated lactic acid, AMS, and ascites that is new for her concern for SBP; however, she has already received antibiotics in the ER.   IR consulted for [place] paracentesis. Plan on in the AM. Studies ordered She is on 4th generation cephalosporin for coverage.    Acute respiratory failure with hypoxia (HCC) Has prn oxygen at SNF, but 4L is new requirement for her.  Unclear etiology, has had same event back in April  of 2022.  CXR with no acute finding or focal pneumonia or congestion  CT chest with small right pleural effusion and atelectasis SLP eval ordered for swallowing, though no history of aspiration Continue to wean oxygen to maintain saturation >92-93%.     Hypokalemia Replete x 48mq x 1, repeat bmp tonight and will have dialysis tomorrow Okay with nephrology for replacement Mag pending Continue to monitor  ESRD (end stage renal disease) ([place] -dialysis Tues/Thur/Saturday, has not missed any sessions -nephrology consulted and they will see and plan for dialysis tomorrow.    Lactic acidosis Concern for infectious source with ascites, fever and AMS vs. Possible urine infection Cultures pending and antibiotics started  Did not receive large fluid boluses due to ESRD and CHF; however was placed on 150cc/hour of LR, I decreased this down to 75cc/hour x 10 hours as I do not want to volume overload her and no true sepsis criteria was met. She has not had any hypotension, tachycardia.   Thrombocytopenia  88K, has history of being down to 53K 3 months ago Does have petechial rash on her abdomen only, no where else on  body and no other signs of bleeding. Do not feel like this is an ITP picture and ? If infection is driving this Holding heparin and SCDs only ASA held as well, appears she has not been taking this since past week from her MAR  type II diabetes mellitus with renal complication ([place] -last a1c of 5.9 in 09/2020 on on medication -sensitive renal SSI and accuchecks when more alert and tolerating PO     Essential hypertension Stable, continue home medication and dialysis  Hydralazine prn parameters.     Anemia of chronic disease Stable continue to monitor.     Depression Continue prozac 292mdaily    Chronic diastolic CHF (congestive heart failure) (HCC)  Strict I/o and daily weghts Recent echo 09/2020: EF of 50-55%. LV with low normal function. Diastolic parameters are indeterminate. RV severely enlarged. LA and RA moderately dilated. Small pericardial effusion. Severe TR.  Followed by cardiology with volume control through dialysis  -decreased IVF as I do not want her to be volume overloaded. Dialysis tomorrow.     Body mass index is 24.66 kg/m.   Level of care: Progressive DVT prophylaxis:  SCDs secondary to platelets of 88K.  Code Status: DNR confirmed with sister/paperwork from SNF  Family Communication: None present; I spoke with the patient's sister by telephone at the time of admission, DeDeloria [phone] Disposition Plan:  The patient is from: SNF  Anticipated d/c is to: SNF  Requires inpatient hospitalization and is at significant risk of neurological or metabolic worsening, requires constant monitoring, oxygen, IV antibiotic and fluids and assessment/procedures and MDM with specialists.  Patient is currently: acutely ill and prognosis is guarded  Consults called: nephrology and IR  Admission status:  inpatient    AlOrma FlamingD [facility]   How to contact the TRJ[facility] Memorial Hospitalttending or Consulting provider 7AIaegerr covering provider during after hours 7PCarter for this patient?  Check the care team in [facility] look for a) attending/consulting TRH provider listed and b) the [facility] listed Log into [email or web address] and use [place]'s universal password to access. If you do not have the password, please contact the hospital operator. Locate the TRAdvanced [facility] Institute Treatment Center LLCrovider you are looking for under [facility] and page to a number that you can be directly reached. If you still have difficulty reaching the provider,  please page the [facility] (Director on Call) for the Hospitalists listed on amion for assistance.   01/25/2021, 7:24 PM

## 2021-01-25 NOTE — ED Notes (Signed)
Area around pt;s R hand noted to be cool, swollen; IV appears to be infiltrated (LR infusion running); IV removed; Pharmacy contacted; Spoke with Velvet Bathe of pharmacy, who recommends elevation of the extremity, application of hot or cold compresses, monitor area; Dr. Tamera Punt notified; ice pack applied

## 2021-01-25 NOTE — ED Notes (Signed)
Dr. Tamera Punt notified of elevated lactic

## 2021-01-26 ENCOUNTER — Inpatient Hospital Stay (HOSPITAL_COMMUNITY): Payer: Medicare Other

## 2021-01-26 LAB — COMPREHENSIVE METABOLIC PANEL
ALT: 15 U/L (ref 0–44)
AST: 26 U/L (ref 15–41)
Albumin: 2.4 g/dL — ABNORMAL LOW (ref 3.5–5.0)
Alkaline Phosphatase: 66 U/L (ref 38–126)
Anion gap: 11 (ref 5–15)
BUN: 13 mg/dL (ref 6–20)
CO2: 28 mmol/L (ref 22–32)
Calcium: 8.5 mg/dL — ABNORMAL LOW (ref 8.9–10.3)
Chloride: 99 mmol/L (ref 98–111)
Creatinine, Ser: 2.28 mg/dL — ABNORMAL HIGH (ref 0.44–1.00)
GFR, Estimated: 24 mL/min — ABNORMAL LOW (ref >60)
Glucose, Bld: 97 mg/dL (ref 70–99)
Potassium: 3.2 mmol/L — ABNORMAL LOW (ref 3.5–5.1)
Sodium: 138 mmol/L (ref 135–145)
Total Bilirubin: 1.5 mg/dL — ABNORMAL HIGH (ref 0.3–1.2)
Total Protein: 5.4 g/dL — ABNORMAL LOW (ref 6.5–8.1)

## 2021-01-26 LAB — BODY FLUID CELL COUNT WITH DIFFERENTIAL
Eos, Fluid: 0 %
Lymphs, Fluid: 78 %
Monocyte-Macrophage-Serous Fluid: 16 % — ABNORMAL LOW (ref 50–90)
Neutrophil Count, Fluid: 6 % (ref 0–25)
Total Nucleated Cell Count, Fluid: 51 cu mm (ref 0–1000)

## 2021-01-26 LAB — GLUCOSE, CAPILLARY
Glucose-Capillary: 94 mg/dL (ref 70–99)
Glucose-Capillary: 79 mg/dL (ref 70–99)
Glucose-Capillary: 49 mg/dL — ABNORMAL LOW (ref 70–99)
Glucose-Capillary: 116 mg/dL — ABNORMAL HIGH (ref 70–99)
Glucose-Capillary: 196 mg/dL — ABNORMAL HIGH (ref 70–99)

## 2021-01-26 LAB — CBC
HCT: 30.9 % — ABNORMAL LOW (ref 36.0–46.0)
Hemoglobin: 9.4 g/dL — ABNORMAL LOW (ref 12.0–15.0)
MCH: 28.2 pg (ref 26.0–34.0)
MCHC: 30.4 g/dL (ref 30.0–36.0)
MCV: 92.8 fL (ref 80.0–100.0)
Platelets: 102 10*3/uL — ABNORMAL LOW (ref 150–400)
RBC: 3.33 MIL/uL — ABNORMAL LOW (ref 3.87–5.11)
RDW: 21.4 % — ABNORMAL HIGH (ref 11.5–15.5)
WBC: 9.8 10*3/uL (ref 4.0–10.5)
nRBC: 0 % (ref 0.0–0.2)

## 2021-01-26 LAB — ALBUMIN, PLEURAL OR PERITONEAL FLUID: Albumin, Fluid: 1.3 g/dL

## 2021-01-26 LAB — LACTATE DEHYDROGENASE: LDH: 255 U/L — ABNORMAL HIGH (ref 98–192)

## 2021-01-26 LAB — GLUCOSE, PLEURAL OR PERITONEAL FLUID: Glucose, Fluid: 88 mg/dL

## 2021-01-26 LAB — PROCALCITONIN: Procalcitonin: 7.2 ng/mL

## 2021-01-26 LAB — LACTATE DEHYDROGENASE, PLEURAL OR PERITONEAL FLUID: LD, Fluid: 66 U/L — ABNORMAL HIGH (ref 3–23)

## 2021-01-26 LAB — PROTIME-INR
INR: 1.8 — ABNORMAL HIGH (ref 0.8–1.2)
Prothrombin Time: 20.9 seconds — ABNORMAL HIGH (ref 11.4–15.2)

## 2021-01-26 LAB — AMYLASE, PLEURAL OR PERITONEAL FLUID: Amylase, Fluid: 13 U/L

## 2021-01-26 LAB — TSH: TSH: 5.24 u[IU]/mL — ABNORMAL HIGH (ref 0.350–4.500)

## 2021-01-26 LAB — PROTEIN, PLEURAL OR PERITONEAL FLUID: Total protein, fluid: <6 g/dL

## 2021-01-26 LAB — MRSA NEXT GEN BY PCR, NASAL: MRSA by PCR Next Gen: NOT DETECTED

## 2021-01-26 MED ORDER — LIDOCAINE HCL (PF) 1 % IJ SOLN
INTRAMUSCULAR | Status: AC
  Filled 2021-01-26: qty 10

## 2021-01-26 MED ORDER — DOXERCALCIFEROL 4 MCG/2ML IV SOLN: 5.0000 ug | INTRAVENOUS | Status: DC

## 2021-01-26 MED ORDER — MIDODRINE HCL 5 MG PO TABS
5.0000 mg | ORAL_TABLET | Freq: Three times a day (TID) | ORAL | Status: DC
  Administered 2021-01-27 – 2021-01-29 (×7): 5 mg via ORAL
  Filled 2021-01-26 (×7): qty 1

## 2021-01-26 MED ORDER — ASPIRIN 81 MG PO CHEW
81.0000 mg | CHEWABLE_TABLET | Freq: Every day | ORAL | Status: DC
  Administered 2021-01-27 – 2021-02-01 (×6): 81 mg via ORAL
  Filled 2021-01-26 (×6): qty 1

## 2021-01-26 MED ORDER — DARBEPOETIN ALFA 60 MCG/0.3ML IJ SOSY
60.0000 ug | PREFILLED_SYRINGE | INTRAMUSCULAR | Status: DC
  Administered 2021-01-26: 60 ug via INTRAVENOUS

## 2021-01-26 MED ORDER — DEXTROSE 50 % IV SOLN: 12.5000 g | Freq: Once | INTRAVENOUS | Status: DC

## 2021-01-26 MED ORDER — ALBUMIN HUMAN 25 % IV SOLN: 50.0000 g | Freq: Once | INTRAVENOUS | Status: DC | PRN

## 2021-01-26 MED ORDER — K PHOS MONO-SOD PHOS DI & MONO 155-852-130 MG PO TABS
250.0000 mg | ORAL_TABLET | Freq: Once | ORAL | Status: DC
  Filled 2021-01-26: qty 1

## 2021-01-26 MED ORDER — DEXTROSE 50 % IV SOLN
INTRAVENOUS | Status: AC
  Filled 2021-01-26: qty 50

## 2021-01-26 MED ORDER — CARVEDILOL 3.125 MG PO TABS
3.1250 mg | ORAL_TABLET | Freq: Two times a day (BID) | ORAL | Status: DC
  Administered 2021-01-27 – 2021-02-01 (×9): 3.125 mg via ORAL
  Filled 2021-01-26 (×10): qty 1

## 2021-01-26 MED ORDER — LEVOTHYROXINE SODIUM 100 MCG/5ML IV SOLN
50.0000 ug | Freq: Every day | INTRAVENOUS | Status: DC
  Administered 2021-01-27 – 2021-01-30 (×4): 50 ug via INTRAVENOUS
  Filled 2021-01-26 (×5): qty 5

## 2021-01-26 MED ORDER — HEPARIN SODIUM (PORCINE) 5000 UNIT/ML IJ SOLN
5000.0000 [IU] | Freq: Three times a day (TID) | INTRAMUSCULAR | Status: DC
  Administered 2021-01-26 – 2021-02-01 (×16): 5000 [IU] via SUBCUTANEOUS
  Filled 2021-01-26 (×16): qty 1

## 2021-01-26 NOTE — Procedures (Signed)
PROCEDURE SUMMARY:  Successful image-guided paracentesis from the right  lower abdomen.  Yielded 1.1 liters of hazy blood tinged fluid.  No immediate complications.  EBL = less than 1 mL. Patient tolerated well.   Specimen was sent for labs.  Please see imaging section of Epic for full dictation.   Armando Gang Kadynce Bonds PA-C 01/26/2021 1:13 PM

## 2021-01-26 NOTE — Progress Notes (Signed)
PT Cancellation Note  Patient Details Name: MADYN FULLEN MRN: [id number] DOB: [birth year]   Cancelled Treatment:    Reason Eval/Treat Not Completed: Medical issues which prohibited therapy Discussed case with RN- not quite medically ready for PT today. Will check back tomorrow.   Windell Norfolk, DPT, PN2   Supplemental Physical Therapist [place]    Pager [phone] Acute Rehab Office [phone]

## 2021-01-26 NOTE — Progress Notes (Signed)
Hypoglycemic Event  CBG: 49 (1815)  Treatment: 8oz grape juice, nepro drink, dinner (small bowl chick broth, orange jello)  Symptoms: None   Follow-up CBG: Time: 1943 CBG Result: 116  Possible Reasons for Event:  NPO for extended period of time   Comments/MD notified: Dr.Singh notified. Verbal order for 0.5 amp 5% Dextrose, but peripheral IV not working upon arrival from HD. Dr.Singh notified of this, and said PO's are ok, recheck CBG in 1h. Repeat CBG 116.    [name] Michael [name] Michael

## 2021-01-26 NOTE — Progress Notes (Signed)
PROGRESS NOTE                                                                                                                                                                                                             Patient Demographics:    [name] Michael, is a 60 y.o. female, DOB - 16-Jun-1961, [id number]  Outpatient Primary MD for the patient is Charlott Rakes, MD    LOS - 1  Admit date - 01/25/2021    Chief Complaint  Patient presents with   Abdominal Pain       Brief Narrative (HPI from H&P)   [name] Michael is a 60 y.o. female with medical history significant of ESRD on hemodialysis, history of CVA, hypertension, type 2 diabetes mellitus, and chronic diastolic CHF who presented to ED from her SNF with complaints of altered mental status and abdominal distention, diagnosed with toxic encephalopathy with possible SBP and admitted to the hospital.   Subjective:    [name] Michael today in bed appears confused, mild respiratory distress, unreliable historian but denies any headache chest or abdominal pain.   Assessment  & Plan :     Acute toxic encephalopathy due to E. coli bacteremia suspicious source likely UTI however rule out SBP - she is on appropriate IV antibiotics, mentation is still not back to baseline, head CT nonacute and no focal deficits.  She will undergo diagnostic and therapeutic paracentesis, monitor clinically.  Overall guarded.  2.  ESRD with abdominal ascites, acute respiratory distress with hypoxia - she is on TTS schedule, uses as needed oxygen at SNF, likely fluid overload and ascites compromising her respiratory status due to diaphragmatic pressure.  As above will undergo ultrasound-guided paracentesis for fluid removal along with HD run, nephrology has been consulted.  Continue supplemental oxygen and supportive care.  3.  Acute on chronic diastolic CHF last EF [id number] on recent echocardiogram.  Fluid  removal through HD and ascites fluid removal through paracentesis  4.  Anemia of chronic disease.  Stable.  5.  Chronic thrombocytopenia.  Stable monitor.  6.  Hypertension.  Coreg as tolerated, she also takes midodrine intermittently.  If blood pressure remains soft will stop Coreg altogether.  7.  Hypophosphatemia.  Replaced.  8.  Lactic acidosis due to renal failure and ESRD.  Blood pressure stable no signs of hypoperfusion  will monitor.  9.  Hypothyroidism with last TSH more than 10.  Will check TSH question if this is due to severe hypothyroidism.  Will monitor.    10. DM type II.  Sliding scale.  Lab Results  Component Value Date   HGBA1C 5.9 (H) 10/14/2020   CBG (last 3)  Recent Labs    01/26/21 0734 01/26/21 1146  GLUCAP 94 79   Lab Results  Component Value Date   TSH 13.843 (H) 10/14/2020         Condition - Extremely Guarded  Family Communication  :   Riley Churches on [id number] on 01/26/2021 at 12:18 PM and message left  Niece Larene Beach [id number] on 01/26/2021 at 12:19 PM message left   Code Status :  DNR  Consults  :  Renal, IR  PUD Prophylaxis : PPI   Procedures  :      US Paracentesis  CT head.  Nonacute.    CT chest abdomen pelvis.- Suboptimal evaluation, within this constraint; 1. No acute thoracic or abdominopelvic findings. 2. Cardiomegaly. 3. Small volume right pleural effusion with adjacent basilar consolidation, likely atelectasis though early pneumonia can appear similar. 4. Nonobstructing left nephrolithiasis.  No hydronephrosis. 5. Moderate volume intra-abdominal ascites. 6. Addition and senescent findings, as above. Aortic Atherosclerosis      Disposition Plan  :    Status is: Inpatient  Remains inpatient appropriate because:IV treatments appropriate due to intensity of illness or inability to take PO  Dispo: The patient is from: SNF              Anticipated d/c is to: SNF              Patient currently is not medically stable to  d/c.   Difficult to place patient No   DVT Prophylaxis  :  Heparin added  SCDs Start: 01/25/21 1646   Lab Results  Component Value Date   PLT 102 (L) 01/26/2021    Diet :  Diet Order             Diet clear liquid Room service appropriate? Yes; Fluid consistency: Thin  Diet effective now                    Inpatient Medications  Scheduled Meds:  aspirin  81 mg Oral Daily   atorvastatin  40 mg Oral QHS   [START ON 01/27/2021] carvedilol  3.125 mg Oral BID WC   feeding supplement (NEPRO CARB STEADY)  237 mL Oral TID with meals   feeding supplement (PRO-STAT SUGAR FREE 64)  30 mL Oral q AM   FLUoxetine  20 mg Oral Daily   insulin aspart  0-6 Units Subcutaneous TID WC   levothyroxine  25 mcg Oral Q0600   midodrine  5 mg Oral TID WC   multivitamin  1 tablet Oral QHS   omega-3 acid ethyl esters  2 capsule Oral BID   pantoprazole  80 mg Oral Daily   phosphorus  250 mg Oral Once   sodium bicarbonate  1,300 mg Oral BID   Continuous Infusions:  albumin human     meropenem (MERREM) IV     PRN Meds:.acetaminophen **OR** [DISCONTINUED] acetaminophen, albumin human, cloNIDine, hydrALAZINE  Antibiotics  :    Anti-infectives (From admission, onward)    Start     Dose/Rate Route Frequency Ordered Stop   01/26/21 2200  meropenem (MERREM) 500 mg in sodium chloride 0.9 % 100 mL IVPB  500 mg 200 mL/hr over 30 Minutes Intravenous Every 24 hours 01/25/21 2151     01/26/21 1800  ceFEPIme (MAXIPIME) 2 g in sodium chloride 0.9 % 100 mL IVPB  Status:  Discontinued        2 g 200 mL/hr over 30 Minutes Intravenous Every T-Th-Sa (1800) 01/25/21 1149 01/25/21 2149   01/25/21 2245  meropenem (MERREM) 1 g in sodium chloride 0.9 % 100 mL IVPB        1 g 200 mL/hr over 30 Minutes Intravenous  Once 01/25/21 2149 01/25/21 2334   01/25/21 2000  metroNIDAZOLE (FLAGYL) IVPB 500 mg  Status:  Discontinued       Note to Pharmacy: 8 hours from last dose   500 mg 100 mL/hr over 60 Minutes  Intravenous Every 8 hours 01/25/21 1945 01/25/21 2149   01/25/21 1015  ceFEPIme (MAXIPIME) 2 g in sodium chloride 0.9 % 100 mL IVPB        2 g 200 mL/hr over 30 Minutes Intravenous  Once 01/25/21 1005 01/25/21 1242   01/25/21 1015  metroNIDAZOLE (FLAGYL) IVPB 500 mg        500 mg 100 mL/hr over 60 Minutes Intravenous  Once 01/25/21 1005 01/25/21 1212        Time Spent in minutes  30   Lala Lund M.D on 01/26/2021 at 12:00 PM  To page go to [email or web address]   [facility] -  Office  [phone]   See all Orders from today for further details    Objective:   Vitals:   01/26/21 0400 01/26/21 0730 01/26/21 0954 01/26/21 1147  BP: 134/62 138/67  140/67  Pulse: 80 86  83  Resp: '16 15  13  '$ Temp: 98.2 F (36.8 C) 98.6 F (37 C)  98.8 F (37.1 C)  TempSrc: Axillary Axillary  Axillary  SpO2: 93% 95% 98% 98%  Weight: 48.6 kg     Height:        Wt Readings from Last 3 Encounters:  01/26/21 48.6 kg  01/23/21 46.4 kg  01/22/21 46.3 kg     Intake/Output Summary (Last 24 hours) at 01/26/2021 1200 Last data filed at 01/26/2021 0943 Gross per 24 hour  Intake 446.35 ml  Output 0 ml  Net 446.35 ml     Physical Exam  Awake but confused, puffy face and eyes, moderate ascites, abdomen soft, bibasilar crackles,  [place].AT,PERRAL Supple Neck,No JVD, No cervical lymphadenopathy appriciated.  Symmetrical Chest wall movement, Good air movement bilaterally,   RRR,No Gallops,Rubs or new Murmurs, No Parasternal Heave +ve B.Sounds, Abd Soft, No tenderness, No organomegaly appriciated, No rebound - guarding or rigidity. No Cyanosis,    Data Review:    CBC Recent Labs  Lab 01/25/21 0915 01/25/21 1524 01/26/21 0136  WBC 5.9 8.5 9.8  HGB 11.0* 9.8* 9.4*  HCT 37.3 33.9* 30.9*  PLT PLATELET CLUMPS NOTED ON SMEAR, UNABLE TO ESTIMATE 88* 102*  MCV 94.0 96.0 92.8  MCH 27.7 27.8 28.2  MCHC 29.5* 28.9* 30.4  RDW 21.6* 21.9* 21.4*  LYMPHSABS 0.3* 0.3*  --   MONOABS 0.1 0.3  --    EOSABS 0.0 0.0  --   BASOSABS 0.0 0.0  --     Recent Labs  Lab 01/25/21 0857 01/25/21 0915 01/25/21 0926 01/25/21 1057 01/25/21 2114 01/26/21 0136 01/26/21 0726  NA  --  138  --   --  139 138  --   K  --  2.7*  --   --  3.1*  3.2*  --   CL  --  96*  --   --  98 99  --   CO2  --  31  --   --  27 28  --   GLUCOSE  --  173*  --   --  105* 97  --   BUN  --  8  --   --  13 13  --   CREATININE  --  2.02*  --   --  2.18* 2.28*  --   CALCIUM  --  8.7*  --   --  8.9 8.5*  --   AST  --  33  --   --   --  26  --   ALT  --  17  --   --   --  15  --   ALKPHOS  --  91  --   --   --  66  --   BILITOT  --  1.8*  --   --   --  1.5*  --   ALBUMIN  --  3.1*  --   --   --  2.4*  --   MG  --   --   --   --  1.7  --   --   PROCALCITON  --   --   --   --   --   --  7.20  LATICACIDVEN 2.6*  --   --  3.0*  --   --   --   INR  --  1.5*  --   --   --  1.8*  --   AMMONIA  --   --  34  --   --   --   --     ------------------------------------------------------------------------------------------------------------------ No results for input(s): CHOL, HDL, LDLCALC, TRIG, CHOLHDL, LDLDIRECT in the last 72 hours.  Lab Results  Component Value Date   HGBA1C 5.9 (H) 10/14/2020   ------------------------------------------------------------------------------------------------------------------ No results for input(s): TSH, T4TOTAL, T3FREE, THYROIDAB in the last 72 hours.  Invalid input(s): FREET3  Cardiac Enzymes No results for input(s): CKMB, TROPONINI, MYOGLOBIN in the last 168 hours.  Invalid input(s): CK ------------------------------------------------------------------------------------------------------------------    Component Value Date/Time   BNP 1,275.7 (H) 12/03/2019 0329    Radiology Reports CT Abdomen Pelvis Wo Contrast  Result Date: 01/25/2021 CLINICAL DATA:  Abdominal distension EXAM: CT CHEST, ABDOMEN AND PELVIS WITHOUT CONTRAST TECHNIQUE: Multidetector CT imaging of the chest,  abdomen and pelvis was performed following the standard protocol without IV contrast. COMPARISON:  Pelvis radiograph, 10/13/2020. CT abdomen pelvis, 10/05/2020. FINDINGS: The examination is limited secondary to lack of intravenous contrast, and significant motion artifact-most pronounced at the level the chest. CT CHEST FINDINGS Cardiovascular: Cardiomegaly with 4-chamber cardiac enlargement. Multi vessel coronary vascular calcifications. Mediastinum/Nodes: No enlarged mediastinal, hilar, or axillary lymph nodes. Thyroid gland, trachea, and esophagus demonstrate no significant findings. Lungs/Pleura: Small volume right pleural effusion, with adjacent dependent atelectasis. Musculoskeletal: Healing left fifth anterior rib fracture CT ABDOMEN PELVIS FINDINGS Hepatobiliary: No focal liver abnormality is seen. No gallstones, gallbladder wall thickening, or biliary dilatation. Pancreas: Atrophic.  No pancreatic duct dilation. Spleen: Normal in size without focal abnormality. Adrenals/Urinary Tract: Adrenal glands are unremarkable. Perirenal stranding. Punctate nonobstructing nephroliths within the left renal collecting system. No hydronephrosis. Bladder is unremarkable. Stomach/Bowel: Stomach is within normal limits. Appendix appears normal. No evidence of bowel wall thickening, distention, or inflammatory changes. Vascular/Lymphatic: Aortic atherosclerosis. No aneurysmal dilation. No enlarged abdominal or pelvic lymph nodes.  Reproductive: Uterus and bilateral adnexa are unremarkable. Other: Moderate volume of intra-abdominal ascites, as seen along the dependent pelvis, pericolic gutters, perihepatic and perisplenic spaces. Musculoskeletal: Diffuse subcutaneous edema. No acute osseous abnormality. IMPRESSION: Suboptimal evaluation, within this constraint; 1. No acute thoracic or abdominopelvic findings. 2. Cardiomegaly. 3. Small volume right pleural effusion with adjacent basilar consolidation, likely atelectasis though  early pneumonia can appear similar. 4. Nonobstructing left nephrolithiasis.  No hydronephrosis. 5. Moderate volume intra-abdominal ascites. 6. Addition and senescent findings, as above. Aortic Atherosclerosis (ICD10-I70.0). Electronically Signed   By: Michaelle Birks MD   On: 01/25/2021 12:11   DG Chest 1 View  Result Date: 01/22/2021 CLINICAL DATA:  Fall hypoxia EXAM: CHEST  1 VIEW COMPARISON:  10/13/2020 FINDINGS: Cardiomegaly with aortic atherosclerosis. No focal opacity or pleural effusion. No pneumothorax. IMPRESSION: No active disease.  Cardiomegaly Electronically Signed   By: Donavan Foil M.D.   On: 01/22/2021 18:57   DG Thoracic Spine 2 View  Result Date: 01/22/2021 CLINICAL DATA:  Fall EXAM: THORACIC SPINE 2 VIEWS COMPARISON:  Thoracic spine radiograph 02/29/2020 FINDINGS: There is no radiographically evident thoracic spine fracture. Mild multilevel degenerative changes. IMPRESSION: No radiographically evident thoracic spine fracture. Mild multilevel degenerative changes. Electronically Signed   By: Maurine Simmering   On: 01/22/2021 18:58   CT Head Wo Contrast  Result Date: 01/25/2021 CLINICAL DATA:  Mental status change, unknown cause EXAM: CT HEAD WITHOUT CONTRAST TECHNIQUE: Contiguous axial images were obtained from the base of the skull through the vertex without intravenous contrast. COMPARISON:  CT October 13, 2020. FINDINGS: Motion limited study.  Within this limitation: Brain: No evidence of acute large vascular territory infarction, hemorrhage, hydrocephalus, extra-axial collection or mass lesion/mass effect. Similar remote lacunar infarcts in the bilateral basal ganglia and thalami. Similar additional patchy white matter hypoattenuation, likely related to chronic microvascular ischemic disease. Vascular: No hyperdense vessel identified. Skull: No acute fracture. Sinuses/Orbits: Visualized sinuses are clear. Similar exophthalmos without acute orbital finding. Other: No sizable mastoid effusions.  IMPRESSION: 1. No evidence of acute intracranial abnormality on this motion study. 2. Similar remote lacunar infarcts in bilateral basal ganglia and thalami and chronic microvascular ischemic disease. Electronically Signed   By: Margaretha Sheffield MD   On: 01/25/2021 14:20   CT Chest Wo Contrast  Result Date: 01/25/2021 CLINICAL DATA:  Abdominal distension EXAM: CT CHEST, ABDOMEN AND PELVIS WITHOUT CONTRAST TECHNIQUE: Multidetector CT imaging of the chest, abdomen and pelvis was performed following the standard protocol without IV contrast. COMPARISON:  Pelvis radiograph, 10/13/2020. CT abdomen pelvis, 10/05/2020. FINDINGS: The examination is limited secondary to lack of intravenous contrast, and significant motion artifact-most pronounced at the level the chest. CT CHEST FINDINGS Cardiovascular: Cardiomegaly with 4-chamber cardiac enlargement. Multi vessel coronary vascular calcifications. Mediastinum/Nodes: No enlarged mediastinal, hilar, or axillary lymph nodes. Thyroid gland, trachea, and esophagus demonstrate no significant findings. Lungs/Pleura: Small volume right pleural effusion, with adjacent dependent atelectasis. Musculoskeletal: Healing left fifth anterior rib fracture CT ABDOMEN PELVIS FINDINGS Hepatobiliary: No focal liver abnormality is seen. No gallstones, gallbladder wall thickening, or biliary dilatation. Pancreas: Atrophic.  No pancreatic duct dilation. Spleen: Normal in size without focal abnormality. Adrenals/Urinary Tract: Adrenal glands are unremarkable. Perirenal stranding. Punctate nonobstructing nephroliths within the left renal collecting system. No hydronephrosis. Bladder is unremarkable. Stomach/Bowel: Stomach is within normal limits. Appendix appears normal. No evidence of bowel wall thickening, distention, or inflammatory changes. Vascular/Lymphatic: Aortic atherosclerosis. No aneurysmal dilation. No enlarged abdominal or pelvic lymph nodes. Reproductive: Uterus and bilateral adnexa  are unremarkable. Other: Moderate volume of intra-abdominal ascites, as seen along the dependent pelvis, pericolic gutters, perihepatic and perisplenic spaces. Musculoskeletal: Diffuse subcutaneous edema. No acute osseous abnormality. IMPRESSION: Suboptimal evaluation, within this constraint; 1. No acute thoracic or abdominopelvic findings. 2. Cardiomegaly. 3. Small volume right pleural effusion with adjacent basilar consolidation, likely atelectasis though early pneumonia can appear similar. 4. Nonobstructing left nephrolithiasis.  No hydronephrosis. 5. Moderate volume intra-abdominal ascites. 6. Addition and senescent findings, as above. Aortic Atherosclerosis (ICD10-I70.0). Electronically Signed   By: Michaelle Birks MD   On: 01/25/2021 12:11   DG Chest Port 1 View  Result Date: 01/25/2021 CLINICAL DATA:  Questionable sepsis EXAM: PORTABLE CHEST 1 VIEW COMPARISON:  Three days ago FINDINGS: Chronic cardiomegaly. Low volume chest. There is no edema, consolidation, effusion, or pneumothorax. Mild atelectasis or scarring in the left mid chest. Extensive artifact from EKG leads. IMPRESSION: No focal pneumonia. Cardiomegaly. Electronically Signed   By: Monte Fantasia M.D.   On: 01/25/2021 09:24   [name] ASCITES (ABDOMEN LIMITED)  Result Date: 01/25/2021 CLINICAL DATA:  Assess for ascites EXAM: LIMITED ABDOMEN ULTRASOUND FOR ASCITES TECHNIQUE: Limited ultrasound survey for ascites was performed in all four abdominal quadrants. COMPARISON:  CT 01/25/2021 FINDINGS: Targeted sonography of the 4 quadrants is performed. Moderate ascites is visualized with largest pocket in the right lower quadrant. IMPRESSION: Moderate ascites Electronically Signed   By: Donavan Foil M.D.   On: 01/25/2021 17:53

## 2021-01-26 NOTE — Progress Notes (Signed)
2-nurse verification obtained for consent for Paracentesis procedure due to lethargy and confusion. Dr.Singh obtained verbal consent from patient's sister, [name], over the phone as well.   Signed consent has been placed in the patient's chart.

## 2021-01-26 NOTE — Evaluation (Signed)
SLP Cancellation Note  Patient Details Name: [name] Michael MRN: [id number] DOB: 07-29-1960   Cancelled treatment:       Reason Eval/Treat Not Completed: Other (comment) (pt now at dialysis, will continue efforts)  Kathleen Lime, MS [facility] SLP [place] Office [phone] Pager [phone]  Macario Golds 01/26/2021, 3:08 PM

## 2021-01-26 NOTE — Evaluation (Addendum)
SLP Cancellation Note  Patient Details Name: [name] Michael MRN: [id number] DOB: 1961-04-21   Cancelled treatment:       Reason Eval/Treat Not Completed: Other (comment);Medical issues which prohibited therapy (pt npo for paracentesis this afternoon per RN, requested RN message when pt returns and can have po, thanks.)    Pt now at dialysis at 1507, will follow up in the am 01/27/2021.   Kathleen Lime, MS [facility] SLP Acute Rehab Services Office [phone] Pager [phone]   Macario Golds 01/26/2021, 10:22 AM

## 2021-01-26 NOTE — Progress Notes (Signed)
Pt arrived to HD unit from IR procedure post paracentesis  Tolerated removal of 4L during tx  Mentation somewhat improved, no difficulties breathing  Sent back to floor

## 2021-01-26 NOTE — Progress Notes (Signed)
Patient's mentation has greatly improved following paracentesis and HD today. A&Ox3. VS stable. Taking in PO's. Able to move extremities in bed. No reports of pain

## 2021-01-26 NOTE — Progress Notes (Addendum)
US Paracentesis was ordered 8/5. This AM, Dr.Singh entered a STAT Paracentesis order. Per IR, the PA performing paracenteses today is at [facility], and will not be able to get Ms.Fahringer until after 12 today. Per IR, if pt is in HD during this time, the paracentesis may be moved to tomorrow (8/7).   Per HD, patient will come during second shift today (12p-2p). HD is aware of the need for a paracentesis, hopefully today before HD treatment. I have called IR to inform them of HD around 12p, but am unable to reach anyone at this time.   On assessment at 0745, patient is dyspneic on 4L O2 [place]. Increased O2 to 6L and raised HOB, now satting at 96%. Eyes open to voice and obtunded. Intermittently nods head appropriately to questions.  Face and eyes puffy, abdomen distended and tender with scattered petechiae. Bilateral upper extremity nonpitting edema. No edema in lower extremities. Patient anuric. VS stable.

## 2021-01-26 NOTE — Progress Notes (Signed)
OT Cancellation Note  Patient Details Name: [name] Michael MRN: [id number] DOB: 1960-11-21   Cancelled Treatment:    Reason Eval/Treat Not Completed: Medical issues which prohibited therapy (noted in chart pt with planned paracentesis this afternoon, will hold at this time and continue with attempts when medically appropriate)  Jakala Herford OTR/L acute rehab services Office: [phone]   01/26/2021, 12:03 PM
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
## 2021-01-27 LAB — CULTURE, BLOOD (SINGLE)

## 2021-01-27 LAB — URINE CULTURE: Culture: >=100000 — AB

## 2021-01-27 LAB — COMPREHENSIVE METABOLIC PANEL
ALT: 15 U/L (ref 0–44)
AST: 22 U/L (ref 15–41)
Albumin: 2.1 g/dL — ABNORMAL LOW (ref 3.5–5.0)
Alkaline Phosphatase: 63 U/L (ref 38–126)
Anion gap: 9 (ref 5–15)
BUN: 11 mg/dL (ref 6–20)
CO2: 27 mmol/L (ref 22–32)
Calcium: 8.6 mg/dL — ABNORMAL LOW (ref 8.9–10.3)
Chloride: 101 mmol/L (ref 98–111)
Creatinine, Ser: 1.63 mg/dL — ABNORMAL HIGH (ref 0.44–1.00)
GFR, Estimated: 36 mL/min — ABNORMAL LOW (ref >60)
Glucose, Bld: 165 mg/dL — ABNORMAL HIGH (ref 70–99)
Potassium: 3.2 mmol/L — ABNORMAL LOW (ref 3.5–5.1)
Sodium: 137 mmol/L (ref 135–145)
Total Bilirubin: 1.3 mg/dL — ABNORMAL HIGH (ref 0.3–1.2)
Total Protein: 5 g/dL — ABNORMAL LOW (ref 6.5–8.1)

## 2021-01-27 LAB — CBC WITH DIFFERENTIAL/PLATELET
Abs Immature Granulocytes: 0.13 10*3/uL — ABNORMAL HIGH (ref 0.00–0.07)
Basophils Absolute: 0 10*3/uL (ref 0.0–0.1)
Basophils Relative: 0 %
Eosinophils Absolute: 0.1 10*3/uL (ref 0.0–0.5)
Eosinophils Relative: 1 %
HCT: 29.4 % — ABNORMAL LOW (ref 36.0–46.0)
Hemoglobin: 8.9 g/dL — ABNORMAL LOW (ref 12.0–15.0)
Immature Granulocytes: 1 %
Lymphocytes Relative: 3 %
Lymphs Abs: 0.3 10*3/uL — ABNORMAL LOW (ref 0.7–4.0)
MCH: 27.7 pg (ref 26.0–34.0)
MCHC: 30.3 g/dL (ref 30.0–36.0)
MCV: 91.6 fL (ref 80.0–100.0)
Monocytes Absolute: 0.4 10*3/uL (ref 0.1–1.0)
Monocytes Relative: 4 %
Neutro Abs: 8.4 10*3/uL — ABNORMAL HIGH (ref 1.7–7.7)
Neutrophils Relative %: 91 %
Platelets: 79 10*3/uL — ABNORMAL LOW (ref 150–400)
RBC: 3.21 MIL/uL — ABNORMAL LOW (ref 3.87–5.11)
RDW: 21.1 % — ABNORMAL HIGH (ref 11.5–15.5)
WBC: 9.3 10*3/uL (ref 4.0–10.5)
nRBC: 0 % (ref 0.0–0.2)

## 2021-01-27 LAB — GLUCOSE, CAPILLARY
Glucose-Capillary: 167 mg/dL — ABNORMAL HIGH (ref 70–99)
Glucose-Capillary: 160 mg/dL — ABNORMAL HIGH (ref 70–99)
Glucose-Capillary: 160 mg/dL — ABNORMAL HIGH (ref 70–99)
Glucose-Capillary: 182 mg/dL — ABNORMAL HIGH (ref 70–99)

## 2021-01-27 LAB — PROCALCITONIN: Procalcitonin: 8.35 ng/mL

## 2021-01-27 NOTE — Evaluation (Signed)
Physical Therapy Evaluation Patient Details Name: [name] Michael MRN: [id number] DOB: [birth year] Today's Date: 01/27/2021   History of Present Illness  60yo female admitted 01/25/21 with AMS and stomach distension. Found to have acute metabolic encephalopathy, abdominal ascites, and acute respiratory failure with hypoxia. PMH ESRD on HD, HLD, HTN, CVA, syncope, TIA, tobacco abuse, DM, O2 use PRN  Clinical Impression   Patient received in bed, pleasant and cooperative with therapy but generally fatigued. Needed 2 person Mod assist to get OOB to the chair, as well as cues for safety and sequencing with RW. Does have posterior bias in both standing and sitting requiring up to MinA for static balance. Left up in recliner with all needs met, chair alarm active. Recommend return to SNF when medically ready.     Follow Up Recommendations SNF;Supervision/Assistance - 24 hour    Equipment Recommendations  Rolling walker with 5" wheels;3in1 (PT);Wheelchair (measurements PT);Wheelchair cushion (measurements PT)    Recommendations for Other Services       Precautions / Restrictions Precautions Precautions: Fall;Other (comment) Precaution Comments: watch sats Restrictions Weight Bearing Restrictions: No      Mobility  Bed Mobility Overal bed mobility: Needs Assistance Bed Mobility: Supine to Sit     Supine to sit: Max assist;HOB elevated     General bed mobility comments: MaxA with HOB elevated, increased time needed and posterior lean noted    Transfers Overall transfer level: Needs assistance Equipment used: Rolling walker (2 wheeled) Transfers: Sit to/from [facility] Sit to Stand: Mod assist;+2 physical assistance Stand pivot transfers: Mod assist;+2 physical assistance       General transfer comment: ModAx2 to boost to standing, cues for sequencing and safety; able to pivot to recliner with ModAx2 but did need chair brought up close behind her for safety due to  gross weakness  Ambulation/Gait             General Gait Details: unable- fatigue/abdominal pain  Stairs            Wheelchair Mobility    Modified Rankin (Stroke Patients Only)       Balance Overall balance assessment: Needs assistance Sitting-balance support: Bilateral upper extremity supported;Feet unsupported Sitting balance-Leahy Scale: Fair Sitting balance - Comments: posterior bias- close min guard to MinA for balance Postural control: Posterior lean Standing balance support: Bilateral upper extremity supported;During functional activity Standing balance-Leahy Scale: Poor Standing balance comment: ModAx2 and RW for balance                             Pertinent Vitals/Pain Pain Assessment: Faces Faces Pain Scale: Hurts little more Pain Location: stomach (from distension) Pain Descriptors / Indicators: Discomfort Pain Intervention(s): Limited activity within patient's tolerance;Monitored during session;Repositioned    Home Living Family/patient expects to be discharged to:: Skilled nursing facility                 Additional Comments: From [facility]; information taken from prior charting as she is somewhat of a difficult historian but seems consistent with current level of function    Prior Function Level of Independence: Needs assistance   Gait / Transfers Assistance Needed: walks with rollator and uses w/c for longer distances  ADL's / Homemaking Assistance Needed: assist with bathing and dressing  Comments: pt reports she was working with therapies at [facility]     Hand Dominance        Extremity/Trunk Assessment   Upper Extremity  Assessment Upper Extremity Assessment: Defer to OT evaluation    Lower Extremity Assessment Lower Extremity Assessment: Generalized weakness    Cervical / Trunk Assessment Cervical / Trunk Assessment: Kyphotic  Communication   Communication: No difficulties  Cognition Arousal/Alertness:  Awake/alert Behavior During Therapy: Flat affect Overall Cognitive Status: Impaired/Different from baseline Area of Impairment: Safety/judgement;Awareness;Problem solving                         Safety/Judgement: Decreased awareness of safety;Decreased awareness of deficits Awareness: Intellectual Problem Solving: Slow processing;Decreased initiation;Difficulty sequencing;Requires verbal cues General Comments: pleasant but a bit of a difficult historian- able to follow cues well but does have a hard time expressing herself verbally it seems. Cues for safety/sequencing and to prevent premature sitting.      General Comments General comments (skin integrity, edema, etc.): difficult to get accurate SpO2 reading- on 1LPM for mobility. Returned to 2.5 LPM at EOS with spo2 >94%    Exercises     Assessment/Plan    PT Assessment Patient needs continued PT services  PT Problem List Decreased strength;Decreased cognition;Decreased knowledge of use of DME;Decreased activity tolerance;Decreased safety awareness;Decreased balance;Decreased mobility       PT Treatment Interventions DME instruction;Balance training;Gait training;Cognitive remediation;Functional mobility training;Patient/family education;Therapeutic activities;Therapeutic exercise;Wheelchair mobility training    PT Goals (Current goals can be found in the Care Plan section)  Acute Rehab PT Goals Patient Stated Goal: none stated PT Goal Formulation: With patient Time For Goal Achievement: 02/10/21 Potential to Achieve Goals: Fair    Frequency Min 2X/week   Barriers to discharge        Co-evaluation               AM-PAC PT "6 Clicks" Mobility  Outcome Measure Help needed turning from your back to your side while in a flat bed without using bedrails?: A Little Help needed moving from lying on your back to sitting on the side of a flat bed without using bedrails?: A Lot Help needed moving to and from a bed  to a chair (including a wheelchair)?: Total Help needed standing up from a chair using your arms (e.g., wheelchair or bedside chair)?: Total Help needed to walk in hospital room?: Total Help needed climbing 3-5 steps with a railing? : Total 6 Click Score: 9    End of Session   Activity Tolerance: Patient tolerated treatment well Patient left: in chair;with call bell/phone within reach;with chair alarm set Nurse Communication: Mobility status;Need for lift equipment PT Visit Diagnosis: Unsteadiness on feet (R26.81);Muscle weakness (generalized) (M62.81);Difficulty in walking, not elsewhere classified (R26.2)    Time: 7482-7078 PT Time Calculation (min) (ACUTE ONLY): 23 min   Charges:   PT Evaluation $PT Eval Moderate Complexity: 1 Mod (co-eval with OT)        Ann Lions PT, DPT, PN2   Supplemental Physical Therapist [place]    Pager [phone] Acute Rehab Office [phone]

## 2021-01-27 NOTE — Progress Notes (Signed)
PROGRESS NOTE                                                                                                                                                                                                             Patient Demographics:    [name] Michael, is a 60 y.o. female, DOB - Oct 09, 1960, [id number]  Outpatient Primary MD for the patient is Charlott Rakes, MD    LOS - 2  Admit date - 01/25/2021    Chief Complaint  Patient presents with   Abdominal Pain       Brief Narrative (HPI from H&P)   [name] Michael is a 60 y.o. female with medical history significant of ESRD on hemodialysis, history of CVA, hypertension, type 2 diabetes mellitus, and chronic diastolic CHF who presented to ED from her SNF with complaints of altered mental status and abdominal distention, diagnosed with toxic encephalopathy with possible SBP and admitted to the hospital.   Subjective:   She had in bed appears confused but in no distress, denies any headache or chest pain, no abdominal pain but poor historian.   Assessment  & Plan :     Acute toxic encephalopathy due to E. coli bacteremia suspicious source likely UTI, ruled out SBP - she is on appropriate IV meropenem as she is now growing ESBL E. coli in her blood, source likely UTI, paracentesis not suggestive of SBP, SAAG score under 6 with minimal WBCs.  Overall still extremely guarded.  Continue to monitor.  2.  ESRD with abdominal ascites, acute respiratory distress with hypoxia - she is on TTS schedule, uses as needed oxygen at SNF, likely fluid overload and ascites compromising her respiratory status due to diaphragmatic pressure.  Nephrology team is following had 3 L of fluid removed with HD on 01/26/2021.  3.  Acute on chronic diastolic CHF last EF [id number] on recent echocardiogram.  Fluid removal through HD and ascites fluid removal through paracentesis  4.  Anemia of chronic disease.   Stable.  5.  Chronic thrombocytopenia.  Stable monitor.  6.  Hypertension.  Coreg as tolerated, she also takes midodrine intermittently.  If blood pressure remains soft will stop Coreg altogether.  7.  Hypophosphatemia.  Replaced.  8.  Lactic acidosis due to renal failure and ESRD.  Blood pressure stable no signs of hypoperfusion will monitor.  9.  Hypothyroidism with stable TSH currently on IV Synthroid.  10. DM type II.  Sliding scale.  Lab Results  Component Value Date   HGBA1C 5.9 (H) 10/14/2020   CBG (last 3)  Recent Labs    01/26/21 2249 01/27/21 0733 01/27/21 1132  GLUCAP 196* 167* 160*   Lab Results  Component Value Date   TSH 5.240 (H) 01/26/2021         Condition - Extremely Guarded  Family Communication  :   Riley Churches on [id number] on 01/26/2021    Niece Larene Beach [id number] on 01/26/2021 at 12:19 PM message left   Code Status :  DNR  Consults  :  Renal, IR  PUD Prophylaxis : PPI   Procedures  :      US Paracentesis with 1.1 L of fluid removed  CT head.  Nonacute.    CT chest abdomen pelvis.- Suboptimal evaluation, within this constraint; 1. No acute thoracic or abdominopelvic findings. 2. Cardiomegaly. 3. Small volume right pleural effusion with adjacent basilar consolidation, likely atelectasis though early pneumonia can appear similar. 4. Nonobstructing left nephrolithiasis.  No hydronephrosis. 5. Moderate volume intra-abdominal ascites. 6. Addition and senescent findings, as above. Aortic Atherosclerosis      Disposition Plan  :    Status is: Inpatient  Remains inpatient appropriate because:IV treatments appropriate due to intensity of illness or inability to take PO  Dispo: The patient is from: SNF              Anticipated d/c is to: SNF              Patient currently is not medically stable to d/c.   Difficult to place patient No   DVT Prophylaxis  :  Heparin added  heparin injection 5,000 Units Start: 01/26/21 1400 SCDs Start:  01/25/21 1646   Lab Results  Component Value Date   PLT 79 (L) 01/27/2021    Diet :  Diet Order             DIET DYS 2 Room service appropriate? Yes with Assist; Fluid consistency: Thin  Diet effective now                    Inpatient Medications  Scheduled Meds:  aspirin  81 mg Oral Daily   atorvastatin  40 mg Oral QHS   carvedilol  3.125 mg Oral BID WC   darbepoetin (ARANESP) injection - DIALYSIS  60 mcg Intravenous Q Sat-HD   dextrose  12.5 g Intravenous Once   [START ON 01/29/2021] doxercalciferol  5 mcg Intravenous Q T,Th,Sa-HD   feeding supplement (NEPRO CARB STEADY)  237 mL Oral TID with meals   feeding supplement (PRO-STAT SUGAR FREE 64)  30 mL Oral q AM   FLUoxetine  20 mg Oral Daily   heparin injection (subcutaneous)  5,000 Units Subcutaneous Q8H   insulin aspart  0-6 Units Subcutaneous TID WC   levothyroxine  50 mcg Intravenous Daily   midodrine  5 mg Oral TID WC   multivitamin  1 tablet Oral QHS   omega-3 acid ethyl esters  2 capsule Oral BID   pantoprazole  80 mg Oral Daily   phosphorus  250 mg Oral Once   sodium bicarbonate  1,300 mg Oral BID   Continuous Infusions:  albumin human     meropenem (MERREM) IV 500 mg (01/26/21 2247)   PRN Meds:.acetaminophen **OR** [DISCONTINUED] acetaminophen, albumin human, cloNIDine, hydrALAZINE  Antibiotics  :    Anti-infectives (From  admission, onward)    Start     Dose/Rate Route Frequency Ordered Stop   01/26/21 2200  meropenem (MERREM) 500 mg in sodium chloride 0.9 % 100 mL IVPB        500 mg 200 mL/hr over 30 Minutes Intravenous Every 24 hours 01/25/21 2151     01/26/21 1800  ceFEPIme (MAXIPIME) 2 g in sodium chloride 0.9 % 100 mL IVPB  Status:  Discontinued        2 g 200 mL/hr over 30 Minutes Intravenous Every T-Th-Sa (1800) 01/25/21 1149 01/25/21 2149   01/25/21 2245  meropenem (MERREM) 1 g in sodium chloride 0.9 % 100 mL IVPB        1 g 200 mL/hr over 30 Minutes Intravenous  Once 01/25/21 2149  01/25/21 2334   01/25/21 2000  metroNIDAZOLE (FLAGYL) IVPB 500 mg  Status:  Discontinued       Note to Pharmacy: 8 hours from last dose   500 mg 100 mL/hr over 60 Minutes Intravenous Every 8 hours 01/25/21 1945 01/25/21 2149   01/25/21 1015  ceFEPIme (MAXIPIME) 2 g in sodium chloride 0.9 % 100 mL IVPB        2 g 200 mL/hr over 30 Minutes Intravenous  Once 01/25/21 1005 01/25/21 1242   01/25/21 1015  metroNIDAZOLE (FLAGYL) IVPB 500 mg        500 mg 100 mL/hr over 60 Minutes Intravenous  Once 01/25/21 1005 01/25/21 1212        Time Spent in minutes  30   Lala Lund M.D on 01/27/2021 at 1:04 PM  To page go to [email or web address]   [facility] -  Office  [phone]   See all Orders from today for further details    Objective:   Vitals:   01/27/21 0500 01/27/21 0730 01/27/21 0800 01/27/21 1135  BP:  (!) 127/53 140/70 133/62  Pulse:  93 (!) 174 86  Resp:  '16 19 17  '$ Temp:  98.1 F (36.7 C)  99 F (37.2 C)  TempSrc:  Axillary  Oral  SpO2:  97% (!) 81% (!) 86%  Weight: 42.6 kg     Height:        Wt Readings from Last 3 Encounters:  01/27/21 42.6 kg  01/23/21 46.4 kg  01/22/21 46.3 kg     Intake/Output Summary (Last 24 hours) at 01/27/2021 1304 Last data filed at 01/27/2021 1237 Gross per 24 hour  Intake 410 ml  Output 4000 ml  Net -3590 ml     Physical Exam  Overall confused, unable to answer questions or follow commands appropriately, appears cachectic but third spaced fluid [place].AT,PERRAL Supple Neck,No JVD, No cervical lymphadenopathy appriciated.  Symmetrical Chest wall movement, Good air movement bilaterally, CTAB RRR,No Gallops, Rubs or new Murmurs, No Parasternal Heave +ve B.Sounds, Abd Soft, No tenderness, No organomegaly appriciated, No rebound - guarding or rigidity. No Cyanosis,     Data Review:    CBC Recent Labs  Lab 01/25/21 0915 01/25/21 1524 01/26/21 0136 01/27/21 0616  WBC 5.9 8.5 9.8 9.3  HGB 11.0* 9.8* 9.4* 8.9*  HCT 37.3  33.9* 30.9* 29.4*  PLT PLATELET CLUMPS NOTED ON SMEAR, UNABLE TO ESTIMATE 88* 102* 79*  MCV 94.0 96.0 92.8 91.6  MCH 27.7 27.8 28.2 27.7  MCHC 29.5* 28.9* 30.4 30.3  RDW 21.6* 21.9* 21.4* 21.1*  LYMPHSABS 0.3* 0.3*  --  0.3*  MONOABS 0.1 0.3  --  0.4  EOSABS 0.0 0.0  --  0.1  BASOSABS  0.0 0.0  --  0.0    Recent Labs  Lab 01/25/21 0857 01/25/21 0915 01/25/21 0926 01/25/21 1057 01/25/21 2114 01/26/21 0136 01/26/21 0726 01/27/21 0616  NA  --  138  --   --  139 138  --  137  K  --  2.7*  --   --  3.1* 3.2*  --  3.2*  CL  --  96*  --   --  98 99  --  101  CO2  --  31  --   --  27 28  --  27  GLUCOSE  --  173*  --   --  105* 97  --  165*  BUN  --  8  --   --  13 13  --  11  CREATININE  --  2.02*  --   --  2.18* 2.28*  --  1.63*  CALCIUM  --  8.7*  --   --  8.9 8.5*  --  8.6*  AST  --  33  --   --   --  26  --  22  ALT  --  17  --   --   --  15  --  15  ALKPHOS  --  91  --   --   --  66  --  63  BILITOT  --  1.8*  --   --   --  1.5*  --  1.3*  ALBUMIN  --  3.1*  --   --   --  2.4*  --  2.1*  MG  --   --   --   --  1.7  --   --   --   PROCALCITON  --   --   --   --   --   --  7.20 8.35  LATICACIDVEN 2.6*  --   --  3.0*  --   --   --   --   INR  --  1.5*  --   --   --  1.8*  --   --   TSH  --   --   --   --   --   --  5.240*  --   AMMONIA  --   --  34  --   --   --   --   --     ------------------------------------------------------------------------------------------------------------------ No results for input(s): CHOL, HDL, LDLCALC, TRIG, CHOLHDL, LDLDIRECT in the last 72 hours.  Lab Results  Component Value Date   HGBA1C 5.9 (H) 10/14/2020   ------------------------------------------------------------------------------------------------------------------ Recent Labs    01/26/21 0726  TSH 5.240*    Cardiac Enzymes No results for input(s): CKMB, TROPONINI, MYOGLOBIN in the last 168 hours.  Invalid input(s):  CK ------------------------------------------------------------------------------------------------------------------    Component Value Date/Time   BNP 1,275.7 (H) 12/03/2019 0329    Radiology Reports CT Abdomen Pelvis Wo Contrast  Result Date: 01/25/2021 CLINICAL DATA:  Abdominal distension EXAM: CT CHEST, ABDOMEN AND PELVIS WITHOUT CONTRAST TECHNIQUE: Multidetector CT imaging of the chest, abdomen and pelvis was performed following the standard protocol without IV contrast. COMPARISON:  Pelvis radiograph, 10/13/2020. CT abdomen pelvis, 10/05/2020. FINDINGS: The examination is limited secondary to lack of intravenous contrast, and significant motion artifact-most pronounced at the level the chest. CT CHEST FINDINGS Cardiovascular: Cardiomegaly with 4-chamber cardiac enlargement. Multi vessel coronary vascular calcifications. Mediastinum/Nodes: No enlarged mediastinal, hilar, or axillary lymph nodes. Thyroid gland, trachea, and esophagus demonstrate no significant findings.  Lungs/Pleura: Small volume right pleural effusion, with adjacent dependent atelectasis. Musculoskeletal: Healing left fifth anterior rib fracture CT ABDOMEN PELVIS FINDINGS Hepatobiliary: No focal liver abnormality is seen. No gallstones, gallbladder wall thickening, or biliary dilatation. Pancreas: Atrophic.  No pancreatic duct dilation. Spleen: Normal in size without focal abnormality. Adrenals/Urinary Tract: Adrenal glands are unremarkable. Perirenal stranding. Punctate nonobstructing nephroliths within the left renal collecting system. No hydronephrosis. Bladder is unremarkable. Stomach/Bowel: Stomach is within normal limits. Appendix appears normal. No evidence of bowel wall thickening, distention, or inflammatory changes. Vascular/Lymphatic: Aortic atherosclerosis. No aneurysmal dilation. No enlarged abdominal or pelvic lymph nodes. Reproductive: Uterus and bilateral adnexa are unremarkable. Other: Moderate volume of  intra-abdominal ascites, as seen along the dependent pelvis, pericolic gutters, perihepatic and perisplenic spaces. Musculoskeletal: Diffuse subcutaneous edema. No acute osseous abnormality. IMPRESSION: Suboptimal evaluation, within this constraint; 1. No acute thoracic or abdominopelvic findings. 2. Cardiomegaly. 3. Small volume right pleural effusion with adjacent basilar consolidation, likely atelectasis though early pneumonia can appear similar. 4. Nonobstructing left nephrolithiasis.  No hydronephrosis. 5. Moderate volume intra-abdominal ascites. 6. Addition and senescent findings, as above. Aortic Atherosclerosis (ICD10-I70.0). Electronically Signed   By: Michaelle Birks MD   On: 01/25/2021 12:11   DG Chest 1 View  Result Date: 01/22/2021 CLINICAL DATA:  Fall hypoxia EXAM: CHEST  1 VIEW COMPARISON:  10/13/2020 FINDINGS: Cardiomegaly with aortic atherosclerosis. No focal opacity or pleural effusion. No pneumothorax. IMPRESSION: No active disease.  Cardiomegaly Electronically Signed   By: Donavan Foil M.D.   On: 01/22/2021 18:57   DG Thoracic Spine 2 View  Result Date: 01/22/2021 CLINICAL DATA:  Fall EXAM: THORACIC SPINE 2 VIEWS COMPARISON:  Thoracic spine radiograph 02/29/2020 FINDINGS: There is no radiographically evident thoracic spine fracture. Mild multilevel degenerative changes. IMPRESSION: No radiographically evident thoracic spine fracture. Mild multilevel degenerative changes. Electronically Signed   By: Maurine Simmering   On: 01/22/2021 18:58   CT Head Wo Contrast  Result Date: 01/25/2021 CLINICAL DATA:  Mental status change, unknown cause EXAM: CT HEAD WITHOUT CONTRAST TECHNIQUE: Contiguous axial images were obtained from the base of the skull through the vertex without intravenous contrast. COMPARISON:  CT October 13, 2020. FINDINGS: Motion limited study.  Within this limitation: Brain: No evidence of acute large vascular territory infarction, hemorrhage, hydrocephalus, extra-axial collection or  mass lesion/mass effect. Similar remote lacunar infarcts in the bilateral basal ganglia and thalami. Similar additional patchy white matter hypoattenuation, likely related to chronic microvascular ischemic disease. Vascular: No hyperdense vessel identified. Skull: No acute fracture. Sinuses/Orbits: Visualized sinuses are clear. Similar exophthalmos without acute orbital finding. Other: No sizable mastoid effusions. IMPRESSION: 1. No evidence of acute intracranial abnormality on this motion study. 2. Similar remote lacunar infarcts in bilateral basal ganglia and thalami and chronic microvascular ischemic disease. Electronically Signed   By: Margaretha Sheffield MD   On: 01/25/2021 14:20   CT Chest Wo Contrast  Result Date: 01/25/2021 CLINICAL DATA:  Abdominal distension EXAM: CT CHEST, ABDOMEN AND PELVIS WITHOUT CONTRAST TECHNIQUE: Multidetector CT imaging of the chest, abdomen and pelvis was performed following the standard protocol without IV contrast. COMPARISON:  Pelvis radiograph, 10/13/2020. CT abdomen pelvis, 10/05/2020. FINDINGS: The examination is limited secondary to lack of intravenous contrast, and significant motion artifact-most pronounced at the level the chest. CT CHEST FINDINGS Cardiovascular: Cardiomegaly with 4-chamber cardiac enlargement. Multi vessel coronary vascular calcifications. Mediastinum/Nodes: No enlarged mediastinal, hilar, or axillary lymph nodes. Thyroid gland, trachea, and esophagus demonstrate no significant findings. Lungs/Pleura: Small volume right pleural effusion,  with adjacent dependent atelectasis. Musculoskeletal: Healing left fifth anterior rib fracture CT ABDOMEN PELVIS FINDINGS Hepatobiliary: No focal liver abnormality is seen. No gallstones, gallbladder wall thickening, or biliary dilatation. Pancreas: Atrophic.  No pancreatic duct dilation. Spleen: Normal in size without focal abnormality. Adrenals/Urinary Tract: Adrenal glands are unremarkable. Perirenal stranding.  Punctate nonobstructing nephroliths within the left renal collecting system. No hydronephrosis. Bladder is unremarkable. Stomach/Bowel: Stomach is within normal limits. Appendix appears normal. No evidence of bowel wall thickening, distention, or inflammatory changes. Vascular/Lymphatic: Aortic atherosclerosis. No aneurysmal dilation. No enlarged abdominal or pelvic lymph nodes. Reproductive: Uterus and bilateral adnexa are unremarkable. Other: Moderate volume of intra-abdominal ascites, as seen along the dependent pelvis, pericolic gutters, perihepatic and perisplenic spaces. Musculoskeletal: Diffuse subcutaneous edema. No acute osseous abnormality. IMPRESSION: Suboptimal evaluation, within this constraint; 1. No acute thoracic or abdominopelvic findings. 2. Cardiomegaly. 3. Small volume right pleural effusion with adjacent basilar consolidation, likely atelectasis though early pneumonia can appear similar. 4. Nonobstructing left nephrolithiasis.  No hydronephrosis. 5. Moderate volume intra-abdominal ascites. 6. Addition and senescent findings, as above. Aortic Atherosclerosis (ICD10-I70.0). Electronically Signed   By: Michaelle Birks MD   On: 01/25/2021 12:11   US Paracentesis  Result Date: 01/26/2021 INDICATION: History of ESRD, ascites seen on previous ultrasound study. Request for therapeutic and diagnostic paracentesis. EXAM: ULTRASOUND GUIDED  PARACENTESIS MEDICATIONS: 10 mL 1% lidocaine COMPLICATIONS: None immediate. PROCEDURE: Informed written consent was obtained from the patient after a discussion of the risks, benefits and alternatives to treatment. A timeout was performed prior to the initiation of the procedure. Initial ultrasound scanning demonstrates a large amount of ascites within the right lower abdominal quadrant. The right lower abdomen was prepped and draped in the usual sterile fashion. 1% lidocaine was used for local anesthesia. Following this, a 19 gauge, 7-cm, Yueh catheter was introduced.  An ultrasound image was saved for documentation purposes. The paracentesis was performed. The catheter was removed and a dressing was applied. The patient tolerated the procedure well without immediate post procedural complication. FINDINGS: A total of approximately 1.1 L of hazy blood-tinged fluid was removed. Samples were sent to the laboratory as requested by the clinical team. IMPRESSION: Successful ultrasound-guided paracentesis yielding 1.1 liters of peritoneal fluid. Read by: Durenda Guthrie, PA-C Electronically Signed   By: Ruthann Cancer MD   On: 01/26/2021 13:52   DG Chest Port 1 View  Result Date: 01/25/2021 CLINICAL DATA:  Questionable sepsis EXAM: PORTABLE CHEST 1 VIEW COMPARISON:  Three days ago FINDINGS: Chronic cardiomegaly. Low volume chest. There is no edema, consolidation, effusion, or pneumothorax. Mild atelectasis or scarring in the left mid chest. Extensive artifact from EKG leads. IMPRESSION: No focal pneumonia. Cardiomegaly. Electronically Signed   By: Monte Fantasia M.D.   On: 01/25/2021 09:24   [name] ASCITES (ABDOMEN LIMITED)  Result Date: 01/25/2021 CLINICAL DATA:  Assess for ascites EXAM: LIMITED ABDOMEN ULTRASOUND FOR ASCITES TECHNIQUE: Limited ultrasound survey for ascites was performed in all four abdominal quadrants. COMPARISON:  CT 01/25/2021 FINDINGS: Targeted sonography of the 4 quadrants is performed. Moderate ascites is visualized with largest pocket in the right lower quadrant. IMPRESSION: Moderate ascites Electronically Signed   By: Donavan Foil M.D.   On: 01/25/2021 17:53

## 2021-01-27 NOTE — Progress Notes (Addendum)
Subjective: More alert today, but still lethargic does not remember dialysis yesterday, tolerated 4 L UF  Objective Vital signs in last 24 hours: Vitals:   01/27/21 0402 01/27/21 0500 01/27/21 0730 01/27/21 0800  BP: (!) 117/51  (!) 127/53 140/70  Pulse: 85  93 (!) 174  Resp: '17  16 19  '$ Temp: 97.6 F (36.4 C)  98.1 F (36.7 C)   TempSrc: Axillary  Axillary   SpO2: 95%  97% (!) 81%  Weight:  42.6 kg    Height:       Weight change: -0.4 kg  Physical Exam:  98%     General: Thin, chronically ill,  lethargic female, NAD Heart: RRR no MRG Lungs: CTA anteriorly normal respiratory effort nonlabored breathing Abdomen: Bowel sounds positive normoactive, distended with ascites Extremities: Bipedal lower extremity edema Neuro: Lethargic, slightly disoriented but not in distress moves all extremities to request  Dialysis Access: Positive bruit LU A  aVF   Dialysis Orders SG KC, TTS 3 hrs 45 minutes EDW 39.5 kg (but not close to EDW weight past several treatments 44.2 closest weight even with extra treatment past week)  2K  2CA bath  LUA aVF  Heparin 2000  Hectorol 5 mics Mircera 50 last given 7/12    Problem/Plan: E. coli bacteremia with AMS /acute metabolic encephalopathy= IV antibiotics per admit team es, febrile illness slightly elevated ammonia, lactic acidosis work-up per admit ESRD -HD TTS use added K bath with low  K on admission hypokalemia K2.7 repleted p.o. supplement now 3.2 Abdominal ascites= no reported history of liver disease or alcohol use -management/wu per admit paracentesis 1 L 8/06, ammonia level 34 management per admit Hypertension/volume  -BP stable /was hypoxic in ER until placed on [place] O2 /also noted per admission notes initially in ER given 150 cc/h of LR decreased by admit team //question BED WT accuracy on admit 9 kg above dry weight /reviewing outpatient records may need higher dry weight/yesterday 4 L UF tolerated 8/06 HD post weight 3 kg over dry weight,  continue UF max as tolerated on midodrine 10 mg predialysis TTS also carvedilol 6.25 mg twice daily need to be holding predialysis Anemia  -Hgb 9.4 > 8.9 we will continue Aranesp 60 q. Saturday HD given  8/6 Metabolic bone disease -noted phosphorus 2.3 noted currently not on binder//calcium corrected okay, continue vitamin D follow-up levels 2 diabetes mellitus= per admit Nutrition -albumin 2.1 renal carb modified and protein supplement when taking p.o.'s  Ernest Haber, PA-C [facility] [phone] 01/27/2021,11:28 AM  LOS: 2 days   Labs: Basic Metabolic Panel: Recent Labs  Lab 01/25/21 2114 01/26/21 0136 01/27/21 0616  NA 139 138 137  K 3.1* 3.2* 3.2*  CL 98 99 101  CO2 '27 28 27  '$ GLUCOSE 105* 97 165*  BUN '13 13 11  '$ CREATININE 2.18* 2.28* 1.63*  CALCIUM 8.9 8.5* 8.6*  PHOS 2.3*  --   --    Liver Function Tests: Recent Labs  Lab 01/25/21 0915 01/26/21 0136 01/27/21 0616  AST 33 26 22  ALT '17 15 15  '$ ALKPHOS 91 66 63  BILITOT 1.8* 1.5* 1.3*  PROT 6.4* 5.4* 5.0*  ALBUMIN 3.1* 2.4* 2.1*   No results for input(s): LIPASE, AMYLASE in the last 168 hours. Recent Labs  Lab 01/25/21 0926  AMMONIA 34   CBC: Recent Labs  Lab 01/25/21 0915 01/25/21 1524 01/26/21 0136 01/27/21 0616  WBC 5.9 8.5 9.8 9.3  NEUTROABS 5.5 7.9*  --  8.4*  HGB 11.0* 9.8* 9.4* 8.9*  HCT 37.3 33.9* 30.9* 29.4*  MCV 94.0 96.0 92.8 91.6  PLT PLATELET CLUMPS NOTED ON SMEAR, UNABLE TO ESTIMATE 88* 102* 79*   Cardiac Enzymes: No results for input(s): CKTOTAL, CKMB, CKMBINDEX, TROPONINI in the last 168 hours. CBG: Recent Labs  Lab 01/26/21 1146 01/26/21 1815 01/26/21 1943 01/26/21 2249 01/27/21 0733  GLUCAP 79 49* 116* 196* 167*    Medications:  albumin human     meropenem (MERREM) IV 500 mg (01/26/21 2247)    aspirin  81 mg Oral Daily   atorvastatin  40 mg Oral QHS   carvedilol  3.125 mg Oral BID WC   darbepoetin (ARANESP) injection - DIALYSIS  60 mcg Intravenous  Q Sat-HD   dextrose  12.5 g Intravenous Once   [START ON 01/29/2021] doxercalciferol  5 mcg Intravenous Q T,Th,Sa-HD   feeding supplement (NEPRO CARB STEADY)  237 mL Oral TID with meals   feeding supplement (PRO-STAT SUGAR FREE 64)  30 mL Oral q AM   FLUoxetine  20 mg Oral Daily   heparin injection (subcutaneous)  5,000 Units Subcutaneous Q8H   insulin aspart  0-6 Units Subcutaneous TID WC   levothyroxine  50 mcg Intravenous Daily   midodrine  5 mg Oral TID WC   multivitamin  1 tablet Oral QHS   omega-3 acid ethyl esters  2 capsule Oral BID   pantoprazole  80 mg Oral Daily   phosphorus  250 mg Oral Once   sodium bicarbonate  1,300 mg Oral BID    I have seen and examined this patient and agree with plan and assessment in the above note with renal recommendations/intervention highlighted.  AMS due to E. Coli bacteremia of unclear source.  Paracentesis negative for SBP.  More awake today.  Continue with HD on TTS schedule.  Governor Rooks Eddith Mentor,MD 01/27/2021 3:42 PM

## 2021-01-27 NOTE — Evaluation (Signed)
Occupational Therapy Evaluation Patient Details Name: [name] Michael MRN: [id number] DOB: 1961/06/20 Today's Date: 01/27/2021    History of Present Illness 60yo female admitted 01/25/21 with AMS and stomach distension. Found to have acute metabolic encephalopathy, abdominal ascites, and acute respiratory failure with hypoxia. PMH ESRD on HD, HLD, HTN, CVA, syncope, TIA, tobacco abuse, DM, O2 use PRN   Clinical Impression   Pt admitted for concerns listed above. PTA pt was living at a SNF, recieiving assistance with all ADL's, ambulating short distances with a rollator and using a WC for longer distances, unsure if pt can self propel the WC or not. At this time pt is limited by pain in her abdomen and low back, requiring her to need mod A +2 for all OOB mobility, max for bed mobility, and min-max A +2 for ADL's. Additionally, pt needing increased verbal cues for sequencing and attending to tasks. At this time pt will benefit from returning to SNF for further therapies. OT will follow acutely.     Follow Up Recommendations  SNF;Supervision/Assistance - 24 hour    Equipment Recommendations  None recommended by OT    Recommendations for Other Services       Precautions / Restrictions Precautions Precautions: Fall;Other (comment) Precaution Comments: watch sats Restrictions Weight Bearing Restrictions: No      Mobility Bed Mobility Overal bed mobility: Needs Assistance Bed Mobility: Supine to Sit     Supine to sit: Max assist;HOB elevated     General bed mobility comments: MaxA with HOB elevated, increased time needed and posterior lean noted    Transfers Overall transfer level: Needs assistance Equipment used: Rolling walker (2 wheeled) Transfers: Sit to/from [facility] Sit to Stand: Mod assist;+2 physical assistance Stand pivot transfers: Mod assist;+2 physical assistance       General transfer comment: ModAx2 to boost to standing, cues for sequencing and  safety; able to pivot to recliner with ModAx2 but did need chair brought up close behind her for safety due to gross weakness    Balance Overall balance assessment: Needs assistance Sitting-balance support: Bilateral upper extremity supported;Feet unsupported Sitting balance-Leahy Scale: Fair Sitting balance - Comments: posterior bias- close min guard to MinA for balance Postural control: Posterior lean Standing balance support: Bilateral upper extremity supported;During functional activity Standing balance-Leahy Scale: Poor Standing balance comment: ModAx2 and RW for balance                           ADL either performed or assessed with clinical judgement   ADL Overall ADL's : Needs assistance/impaired Eating/Feeding: Set up;Sitting   Grooming: Set up;Sitting   Upper Body Bathing: Minimal assistance;Sitting   Lower Body Bathing: Maximal assistance;+2 for safety/equipment;Sitting/lateral leans;Sit to/from stand;+2 for physical assistance   Upper Body Dressing : Minimal assistance;Sitting   Lower Body Dressing: Maximal assistance;+2 for physical assistance;+2 for safety/equipment;Sitting/lateral leans;Sit to/from stand   Toilet Transfer: Moderate assistance;+2 for physical assistance;+2 for safety/equipment;Stand-pivot   Toileting- Clothing Manipulation and Hygiene: Maximal assistance;+2 for safety/equipment;+2 for physical assistance;Sit to/from stand;Sitting/lateral lean   Tub/ Shower Transfer: Moderate assistance;+2 for physical assistance;+2 for safety/equipment   Functional mobility during ADLs: Moderate assistance;+2 for physical assistance;+2 for safety/equipment;Rolling walker General ADL Comments: Pt very weak and limited by pain in her abdomen and low back     Vision Baseline Vision/History: No visual deficits Patient Visual Report: No change from baseline Vision Assessment?: No apparent visual deficits     Perception Perception Perception  Tested?:  No   Praxis Praxis Praxis tested?: Not tested    Pertinent Vitals/Pain Pain Assessment: Faces Faces Pain Scale: Hurts little more Pain Location: stomach (from distension) Pain Descriptors / Indicators: Discomfort Pain Intervention(s): Limited activity within patient's tolerance;Monitored during session;Repositioned     Hand Dominance Right   Extremity/Trunk Assessment Upper Extremity Assessment Upper Extremity Assessment: Generalized weakness   Lower Extremity Assessment Lower Extremity Assessment: Defer to PT evaluation   Cervical / Trunk Assessment Cervical / Trunk Assessment: Kyphotic   Communication Communication Communication: No difficulties   Cognition Arousal/Alertness: Awake/alert Behavior During Therapy: Flat affect Overall Cognitive Status: Impaired/Different from baseline Area of Impairment: Safety/judgement;Awareness;Problem solving                         Safety/Judgement: Decreased awareness of safety;Decreased awareness of deficits Awareness: Intellectual Problem Solving: Slow processing;Decreased initiation;Difficulty sequencing;Requires verbal cues General Comments: pleasant but a bit of a difficult historian- able to follow cues well but does have a hard time expressing herself verbally it seems. Cues for safety/sequencing and to prevent premature sitting.   General Comments  difficult to get accurate SpO2 reading- on 1LPM for mobility. Returned to 2.5 LPM at [facility] with spo2 >94%    Exercises     Shoulder Instructions      Home Living Family/patient expects to be discharged to:: Skilled nursing facility                                 Additional Comments: From [facility]; information taken from prior charting as she is somewhat of a difficult historian but seems consistent with current level of function      Prior Functioning/Environment Level of Independence: Needs assistance  Gait / Transfers Assistance Needed: walks  with rollator and uses w/c for longer distances ADL's / Homemaking Assistance Needed: assist with bathing and dressing   Comments: pt reports she was working with therapies at SNF        OT Problem List: Decreased strength;Decreased activity tolerance;Impaired balance (sitting and/or standing);Impaired vision/perception;Decreased cognition;Decreased safety awareness;Decreased knowledge of use of DME or AE;Cardiopulmonary status limiting activity;Pain      OT Treatment/Interventions: Self-care/ADL training;Therapeutic exercise;Energy conservation;DME and/or AE instruction;Therapeutic activities;Cognitive remediation/compensation;Patient/family education;Balance training    OT Goals(Current goals can be found in the care plan section) Acute Rehab OT Goals Patient Stated Goal: none stated OT Goal Formulation: With patient Time For Goal Achievement: 02/10/21 Potential to Achieve Goals: Fair ADL Goals Pt Will Perform Grooming: with min guard assist;standing Pt Will Perform Upper Body Bathing: with modified independence;sitting Pt Will Perform Lower Body Bathing: sitting/lateral leans;sit to/from stand;with min assist Pt Will Perform Upper Body Dressing: with modified independence;sitting Pt Will Perform Lower Body Dressing: with min assist;sitting/lateral leans;sit to/from stand Pt Will Transfer to Toilet: with min guard assist;stand pivot transfer Pt Will Perform Toileting - Clothing Manipulation and hygiene: with min assist;sitting/lateral leans;sit to/from stand  OT Frequency: Min 2X/week   Barriers to D/C:            Co-evaluation PT/OT/SLP Co-Evaluation/Treatment: Yes Reason for Co-Treatment: Complexity of the patient's impairments (multi-system involvement);For patient/therapist safety;To address functional/ADL transfers PT goals addressed during session: Mobility/safety with mobility;Balance;Proper use of DME OT goals addressed during session: ADL's and self-care;Proper use of  Adaptive equipment and DME      AM-PAC OT "6 Clicks" Daily Activity     Outcome Measure Help from another  person eating meals?: A Little Help from another person taking care of personal grooming?: A Little Help from another person toileting, which includes using toliet, bedpan, or urinal?: A Lot Help from another person bathing (including washing, rinsing, drying)?: Total Help from another person to put on and taking off regular upper body clothing?: A Little Help from another person to put on and taking off regular lower body clothing?: A Lot 6 Click Score: 14   End of Session Equipment Utilized During Treatment: Rolling walker;Gait belt;Oxygen Nurse Communication: Mobility status;Need for lift equipment  Activity Tolerance: Patient limited by fatigue;Patient limited by pain Patient left: in chair;with call bell/phone within reach;with chair alarm set  OT Visit Diagnosis: Unsteadiness on feet (R26.81);Other abnormalities of gait and mobility (R26.89);Muscle weakness (generalized) (M62.81)                Time: [id number] OT Time Calculation (min): 23 min Charges:  OT General Charges $OT Visit: 1 Visit OT Evaluation $OT Eval Moderate Complexity: 1 Mod  Earnesteen Birnie H., OTR/L Acute Rehabilitation  Annalisia Ingber Elane Yolanda Bonine 01/27/2021, 2:27 PM

## 2021-01-27 NOTE — Evaluation (Signed)
Clinical/Bedside Swallow Evaluation Patient Details  Name: [name] Michael MRN: [id number] Date of Birth: [birth year]  Today's Date: 01/27/2021 Time: SLP Start Time (ACUTE ONLY): 0935 SLP Stop Time (ACUTE ONLY): 0950 SLP Time Calculation (min) (ACUTE ONLY): 15 min  Past Medical History:  Past Medical History:  Diagnosis Date   Ambulates with cane    Constipation    Depression    ESRD (end stage renal disease) ([place])    a. TTS Dialysis.   GERD (gastroesophageal reflux disease)    History of stress test    a. 01/2003 MV: EF 74%, no ischemia/infarct.   Hyperlipidemia    Hypertension    Osteoporosis    Stroke [facility]) 04-01-11   left frontal subcortical, saw Dr. [name]    Syncope 11/2019   TIA (transient ischemic attack) 03-12-11   Tobacco abuse    Tricuspid regurgitation    a. 05/2016 Echo: EF 65-70%, Gr2DD, mild MR, nl RV fxn, Triv TR, PASP 100mHg; b. 05/2018 Echo: EF 60-65%, Gr2DD, mild MR/TR, RVSP/PASP 343mg; c. 11/2019 Echo: EF 55-60%, no rwma, mild-mod MR, Sev TR w/ RV dilatation (CTA chest neg for PE).   Type II diabetes mellitus (HCC)    Vitamin D deficiency    Past Surgical History:  Past Surgical History:  Procedure Laterality Date   AV FISTULA PLACEMENT Left 02/21/2019   Procedure: BRACHIOCEPHALIC ARTERIOVENOUS (AV) FISTULA CREATION;  Surgeon: ClMarty HeckMD;  [facility];  Laterality: Left;   B[place] 04/11/2019   Procedure: SECOND STAGE BASILIC VEIN TRANSPOSITION LEFT ARM;  Surgeon: ClMarty HeckMD;  [place] [facility] [place];  Laterality: Left;   IR FLUORO GUIDE CV LINE RIGHT  04/29/2019   IR [place] VASC ACCESS RIGHT  04/29/2019   OPEN REDUCTION INTERNAL FIXATION (ORIF) DISTAL RADIAL FRACTURE Left 01/28/2016   Procedure: OPEN REDUCTION INTERNAL FIXATION (ORIF) DISTAL RADIAL FRACTURE;  Surgeon: FrIran PlanasMD;  [place] [facility];  Laterality: Left;   [place] RIGHT HEART CATH N/A 12/05/2019   Procedure: RIGHT HEART CATH;  Surgeon: EnNelva BushMD;  [facility];  Service: Cardiovascular;  Laterality: N/A;   TONSILLECTOMY AND ADENOIDECTOMY     age 89 HPI:  Payal L NaReberts a [age].o. female with medical history significant of ESRD on hemodialysis, history of CVA, hypertension, type 2 diabetes mellitus, and chronic diastolic CHF who presented to ED from her SNF with complaints of altered mental status and abdominal distention, diagnosed with toxic encephalopathy with possible SBP and admitted to the hospital. Pt s/p paracentesis to R lower abdomen.   Assessment / Plan / Recommendation Clinical Impression  Pt presents with a mild oral dysphagia. Pt with generalized weakness of oral musculature. She exhibited slowed bolus manipulation with prolonged mastication of ice chips and solid PO (greater than 30 seconds for small amounts) and mild oral residuals. Pt with hoarse voice at baseline and lower vocal intensity, note hx of active smoker with supplemental O2 via nasal cannula. No overt s/sx of aspiration were exhibited with any PO. Recommend dysphagia 2 (finely chopped) and thin liquids with meds as tolerated. SLP to follow up.  SLP Visit Diagnosis: Dysphagia, oral phase (R13.11);Dysphagia, unspecified (R13.10)    Aspiration Risk  Mild aspiration risk    Diet Recommendation   Dysphagia 2 (finely chopped) thin liquids   Medication Administration: Whole meds with  liquid    Other  Recommendations Oral Care Recommendations: Oral care BID   Follow up Recommendations Skilled Nursing facility      Frequency and Duration min 2x/week  2 weeks       Prognosis Prognosis for Safe Diet Advancement: Good Barriers to Reach Goals: Time post onset      Swallow Study   General Date of Onset: 01/25/21 HPI: [name] Michael is a 60 y.o. female with medical history significant of ESRD on hemodialysis, history of CVA,  hypertension, type 2 diabetes mellitus, and chronic diastolic CHF who presented to ED from her SNF with complaints of altered mental status and abdominal distention, diagnosed with toxic encephalopathy with possible SBP and admitted to the hospital. Pt s/p paracentesis to R lower abdomen. Type of Study: Bedside Swallow Evaluation Previous Swallow Assessment: none on file Diet Prior to this Study: Thin liquids Temperature Spikes Noted: No Respiratory Status: Nasal cannula History of Recent Intubation: No Behavior/Cognition: Requires cueing;Cooperative;Lethargic/Drowsy Oral Cavity Assessment: Dry Oral Care Completed by SLP: Yes Oral Cavity - Dentition: Missing dentition;Poor condition Vision: Functional for self-feeding Self-Feeding Abilities: Needs assist Patient Positioning: Upright in bed Baseline Vocal Quality: Low vocal intensity Volitional Cough: Weak Volitional Swallow: Able to elicit    Oral/Motor/Sensory Function Overall Oral Motor/Sensory Function: Generalized oral weakness   Ice Chips Ice chips: Impaired Presentation: Spoon Oral Phase Impairments: Impaired mastication Oral Phase Functional Implications: Prolonged oral transit Pharyngeal Phase Impairments: Suspected delayed Swallow;Multiple swallows   Thin Liquid Thin Liquid: Impaired Presentation: Cup;Straw Pharyngeal  Phase Impairments: Suspected delayed Swallow;Multiple swallows    Nectar Thick Nectar Thick Liquid: Not tested   Honey Thick Honey Thick Liquid: Not tested   Puree Puree: Within functional limits   Solid     Solid: Impaired Presentation: Spoon Oral Phase Impairments: Impaired mastication;Reduced lingual movement/coordination Oral Phase Functional Implications: Prolonged oral transit;Impaired mastication;Oral residue Pharyngeal Phase Impairments: Suspected delayed Swallow;Multiple swallows     [name] Rasmussen MA, CCC-SLP Acute Rehabilitation Services   01/27/2021,10:02 AM

## 2021-01-28 DIAGNOSIS — Z7189 Other specified counseling: Secondary | ICD-10-CM

## 2021-01-28 DIAGNOSIS — A419 Sepsis, unspecified organism: Secondary | ICD-10-CM

## 2021-01-28 DIAGNOSIS — N186 End stage renal disease: Secondary | ICD-10-CM

## 2021-01-28 DIAGNOSIS — Z515 Encounter for palliative care: Secondary | ICD-10-CM

## 2021-01-28 DIAGNOSIS — J9601 Acute respiratory failure with hypoxia: Secondary | ICD-10-CM

## 2021-01-28 LAB — COMPREHENSIVE METABOLIC PANEL
ALT: 14 U/L (ref 0–44)
AST: 19 U/L (ref 15–41)
Albumin: 2.3 g/dL — ABNORMAL LOW (ref 3.5–5.0)
Alkaline Phosphatase: 73 U/L (ref 38–126)
Anion gap: 9 (ref 5–15)
BUN: 21 mg/dL — ABNORMAL HIGH (ref 6–20)
CO2: 30 mmol/L (ref 22–32)
Calcium: 9.3 mg/dL (ref 8.9–10.3)
Chloride: 99 mmol/L (ref 98–111)
Creatinine, Ser: 2.59 mg/dL — ABNORMAL HIGH (ref 0.44–1.00)
GFR, Estimated: 21 mL/min — ABNORMAL LOW (ref >60)
Glucose, Bld: 148 mg/dL — ABNORMAL HIGH (ref 70–99)
Potassium: 3.2 mmol/L — ABNORMAL LOW (ref 3.5–5.1)
Sodium: 138 mmol/L (ref 135–145)
Total Bilirubin: 1.4 mg/dL — ABNORMAL HIGH (ref 0.3–1.2)
Total Protein: 5.5 g/dL — ABNORMAL LOW (ref 6.5–8.1)

## 2021-01-28 LAB — CBC WITH DIFFERENTIAL/PLATELET
Abs Immature Granulocytes: 0.05 10*3/uL (ref 0.00–0.07)
Basophils Absolute: 0 10*3/uL (ref 0.0–0.1)
Basophils Relative: 0 %
Eosinophils Absolute: 0.1 10*3/uL (ref 0.0–0.5)
Eosinophils Relative: 1 %
HCT: 31.1 % — ABNORMAL LOW (ref 36.0–46.0)
Hemoglobin: 9.5 g/dL — ABNORMAL LOW (ref 12.0–15.0)
Immature Granulocytes: 1 %
Lymphocytes Relative: 3 %
Lymphs Abs: 0.3 10*3/uL — ABNORMAL LOW (ref 0.7–4.0)
MCH: 27.7 pg (ref 26.0–34.0)
MCHC: 30.5 g/dL (ref 30.0–36.0)
MCV: 90.7 fL (ref 80.0–100.0)
Monocytes Absolute: 0.5 10*3/uL (ref 0.1–1.0)
Monocytes Relative: 5 %
Neutro Abs: 8.4 10*3/uL — ABNORMAL HIGH (ref 1.7–7.7)
Neutrophils Relative %: 90 %
Platelets: 94 10*3/uL — ABNORMAL LOW (ref 150–400)
RBC: 3.43 MIL/uL — ABNORMAL LOW (ref 3.87–5.11)
RDW: 20.8 % — ABNORMAL HIGH (ref 11.5–15.5)
WBC: 9.3 10*3/uL (ref 4.0–10.5)
nRBC: 0 % (ref 0.0–0.2)

## 2021-01-28 LAB — GLUCOSE, CAPILLARY
Glucose-Capillary: 140 mg/dL — ABNORMAL HIGH (ref 70–99)
Glucose-Capillary: 176 mg/dL — ABNORMAL HIGH (ref 70–99)
Glucose-Capillary: 150 mg/dL — ABNORMAL HIGH (ref 70–99)
Glucose-Capillary: 244 mg/dL — ABNORMAL HIGH (ref 70–99)

## 2021-01-28 LAB — PROCALCITONIN: Procalcitonin: 6.94 ng/mL

## 2021-01-28 MED ORDER — HEPARIN SODIUM (PORCINE) 1000 UNIT/ML DIALYSIS
40.0000 [IU]/kg | INTRAMUSCULAR | Status: DC | PRN
Start: 2021-01-28 — End: 2021-01-29

## 2021-01-28 MED ORDER — LIDOCAINE-PRILOCAINE 2.5-2.5 % EX CREA: 1.0000 "application " | TOPICAL_CREAM | CUTANEOUS | Status: DC | PRN

## 2021-01-28 MED ORDER — SODIUM CHLORIDE 0.9 % IV SOLN: 100.0000 mL | INTRAVENOUS | Status: DC | PRN

## 2021-01-28 MED ORDER — HEPARIN SODIUM (PORCINE) 1000 UNIT/ML DIALYSIS: 1000.0000 [IU] | INTRAMUSCULAR | Status: DC | PRN

## 2021-01-28 MED ORDER — PENTAFLUOROPROP-TETRAFLUOROETH EX AERO
1.0000 | INHALATION_SPRAY | CUTANEOUS | Status: DC | PRN
Start: 2021-01-28 — End: 2021-01-29

## 2021-01-28 MED ORDER — SODIUM CHLORIDE 0.9 % IV SOLN
100.0000 mL | INTRAVENOUS | Status: DC | PRN
Start: 2021-01-28 — End: 2021-01-29

## 2021-01-28 MED ORDER — CHLORHEXIDINE GLUCONATE CLOTH 2 % EX PADS
6.0000 | MEDICATED_PAD | Freq: Every day | CUTANEOUS | Status: DC
  Administered 2021-01-28 – 2021-02-01 (×5): 6 via TOPICAL

## 2021-01-28 MED ORDER — PROSOURCE PLUS PO LIQD
30.0000 mL | Freq: Two times a day (BID) | ORAL | Status: DC
  Administered 2021-01-28 – 2021-02-01 (×8): 30 mL via ORAL
  Filled 2021-01-28 (×7): qty 30

## 2021-01-28 MED ORDER — DOXERCALCIFEROL 4 MCG/2ML IV SOLN
4.0000 ug | INTRAVENOUS | Status: DC
  Administered 2021-01-29: 4 ug via INTRAVENOUS
  Filled 2021-01-28 (×2): qty 2

## 2021-01-28 MED ORDER — LIDOCAINE HCL (PF) 1 % IJ SOLN
5.0000 mL | INTRAMUSCULAR | Status: DC | PRN
  Filled 2021-01-28: qty 5

## 2021-01-28 NOTE — Consult Note (Signed)
Consultation Note Date: 01/28/2021   Patient Name: [name] Michael  DOB: [birth year]  MRN: [id number]  Age / Sex: 60 y.o., female  PCP: Charlott Rakes, MD Referring Physician: Thurnell Lose, MD  Reason for Consultation: Establishing goals of care "ESRD, sepsis, multiple admits, appears to have extremely poor baseline"  HPI/Patient Profile: 60 y.o. female  with past medical history of ESRD on HD, chronic diastolic heart failure, history of CVA, type 2 diabetes, HTN, and HLD.  She presented to the emergency department from her SNF on 01/25/2021 with altered mental status and abdominal distention. In the ED, chest/abdomen CT with no acute thoracic or abdo-pelvic findings. Small volume right pleural effusion with adjacent basilar consolidation, likely atelectasis vs. early pneumonia, moderate volume intra-abdominal ascites. Head CT negative for acute findings. Labs significant for hypokalemia and elevated lactic acid. On PRN oxygen at SNF, but requiring 4L in the ED. Admitted to [facility] with acute metabolic encephalopathy, abdominal distention, and acute respiratory failure with hypoxia.   Clinical Assessment and Goals of Care:  I have reviewed medical records including EPIC notes, labs and imaging, and went to see the patient at bedside. She is lethargic, able to tell me she is at [facility] but does not know why she is here. She reports leg pain. She is on 6L oxygen via nasal cannula.   I spoke with sister [name] by phone to discuss diagnosis, prognosis, GOC, EOL wishes, disposition, and options. Patient is known to PMT from her previous hospitalization in April 2022. She has also been followed by outpatient palliative with [facility]. I re-introduced Palliative Medicine as specialized medical care for people living with serious illness. It focuses on providing relief from the symptoms and stress of a serious illness.    We discussed a brief life review of the patient. [name] describes her sister Nyeli as someone who "lives in the moment", and was never very concerned about the future. 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 She never married or had children.  Mardine has resided in a facility since 2018. She has been on dialysis since November 2020. As far as functional status, she spends most of her time in bed but is able to ambulate with a walker mostly to go outside and smoke. [name] expresses frustration that patient continues to smoke. She states that the patient "keeps going downhill".   We discussed Hanako's current illness and what it means in the larger context of her ongoing co-morbidities.  Natural disease trajectory of ESRD and chronic illness was discussed. We discussed the concept of failure to thrive - that it includes poor appetite, increased fatigue, and progressive functional decline. [name] verbalizes understanding, stating that "the body can only last so long".   The difference between full scope medical intervention and comfort  care was considered.  I reviewed the concept of a comfort path, emphasizing this means stopping full scope medical interventions, allowing a natural course to occur. Discussed that the goal is comfort and dignity rather than cure/prolonging life. Encouraged [name] to consider at what point they would want to stop full scope medical interventions, keeping in mind the concept of quality of life.   Provided education and counseling that dialysis is a life-prolonging intervention, and that many patients eventually are not able to tolerate dialysis due to frailty or other medical conditions. Discussed that patients also have the option to stop  dialysis due to poor quality of life. Discussed that when the decision is made to stop dialysis, hospice care is appropriate to ensure comfort at EOL.   [name] plans to talk with her brother and other family members regarding decision to stop dialysis.   Questions and concerns were addressed.  PMT contact info was provided.   Primary decision maker: Deloria Lair (sister)    SUMMARY OF RECOMMENDATIONS   Continue current care Patient has limited insight into her condition and currently cannot participate in complex medical decision Family is considering stopping dialysis and pursuing comfort care PMT will continue to follow  Code Status/Advance Care Planning: DNR  Palliative Prophylaxis:  Oral Care and Turn Reposition  Psycho-social/Spiritual:  Created space and opportunity for family to express thoughts and feelings regarding patient's current medical situation.  Emotional support provided  Prognosis:  Unable to determine  Discharge Planning: To Be Determined      Primary Diagnoses: Present on Admission:  Acute metabolic encephalopathy  Hyperlipidemia  Essential hypertension  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 (HCC)  Depression  Chronic diastolic CHF (congestive heart failure) (HCC)  Anemia of chronic disease  ESRD (end stage renal disease) (HCC)  Lactic acidosis  Acute respiratory failure with hypoxia (HCC)  Abdominal ascites  Hypokalemia  Thrombocytopenia ([place])   I have reviewed the medical record, interviewed the patient and family, and examined the patient. The following aspects are pertinent.  Past Medical History:  Diagnosis Date   Ambulates with cane    Constipation    Depression    ESRD (end stage renal disease) ([place])    a. TTS Dialysis.   GERD (gastroesophageal reflux disease)    History of stress test    a. 01/2003 MV: EF 74%, no ischemia/infarct.   Hyperlipidemia    Hypertension    Osteoporosis    Stroke [facility])  04-01-11   left frontal subcortical, saw Dr. [name]    Syncope 11/2019   TIA (transient ischemic attack) 03-12-11   Tobacco abuse    Tricuspid regurgitation    a. 05/2016 Echo: EF 65-70%, Gr2DD, mild MR, nl RV fxn, Triv TR, PASP 48mHg; b. 05/2018 Echo: EF 60-65%, Gr2DD, mild MR/TR, RVSP/PASP 319mg; c. 11/2019 Echo: EF 55-60%, no rwma, mild-mod MR, Sev TR w/ RV dilatation (CTA chest neg for PE).   Type II diabetes mellitus (HCC)    Vitamin D deficiency      Family History  Problem Relation Age of Onset   Stroke Mother    Diabetes Mother    Kidney failure Mother    Heart failure Mother    Stroke Father    Cancer Sister        Breast- 5015's Colon cancer Maternal Grandmother    Pancreatic cancer Neg Hx    Esophageal cancer Neg Hx    Liver disease Neg Hx    Stomach cancer  Neg Hx    Scheduled Meds:  (feeding supplement) PROSource Plus  30 mL Oral BID BM   aspirin  81 mg Oral Daily   atorvastatin  40 mg Oral QHS   carvedilol  3.125 mg Oral BID WC   Chlorhexidine Gluconate Cloth  6 each Topical Q0600   darbepoetin (ARANESP) injection - DIALYSIS  60 mcg Intravenous Q Sat-HD   dextrose  12.5 g Intravenous Once   [START ON 01/29/2021] doxercalciferol  4 mcg Intravenous Q T,Th,Sa-HD   feeding supplement (NEPRO CARB STEADY)  237 mL Oral TID with meals   feeding supplement (PRO-STAT SUGAR FREE 64)  30 mL Oral q AM   FLUoxetine  20 mg Oral Daily   heparin injection (subcutaneous)  5,000 Units Subcutaneous Q8H   insulin aspart  0-6 Units Subcutaneous TID WC   levothyroxine  50 mcg Intravenous Daily   midodrine  5 mg Oral TID WC   multivitamin  1 tablet Oral QHS   omega-3 acid ethyl esters  2 capsule Oral BID   pantoprazole  80 mg Oral Daily   phosphorus  250 mg Oral Once   sodium bicarbonate  1,300 mg Oral BID   Continuous Infusions:  sodium chloride     sodium chloride     albumin human     meropenem (MERREM) IV Stopped (01/28/21 0741)   PRN Meds:.sodium chloride, sodium  chloride, acetaminophen **OR** [DISCONTINUED] acetaminophen, albumin human, cloNIDine, heparin, heparin, hydrALAZINE, lidocaine (PF), lidocaine-prilocaine, pentafluoroprop-tetrafluoroeth   Allergies  Allergen Reactions   Hydrocodone Nausea And Vomiting and Other (See Comments)    "Allergic," per MAR   Review of Systems  Unable to perform ROS: Mental status change   Physical Exam Vitals reviewed.  Constitutional:      General: She is not in acute distress.    Appearance: She is ill-appearing.  Pulmonary:     Effort: Pulmonary effort is normal.  Abdominal:     General: There is distension.  Neurological:     Mental Status: She is lethargic.     Motor: Weakness present.  Psychiatric:     Comments: Limited insight    Vital Signs: BP 132/88 (BP Location: Right Leg)   Pulse 87   Temp 98.5 F (36.9 C) (Axillary)   Resp 18   Ht '4\' 6"'$  (1.372 m)   Wt 42.6 kg   LMP 03/06/2013 (LMP Unknown)   SpO2 100%   BMI 22.64 kg/m  Pain Scale: 0-10   Pain Score: 0-No pain   SpO2: SpO2: 100 % O2 Device:SpO2: 100 %   IO: Intake/output summary:  Intake/Output Summary (Last 24 hours) at 01/28/2021 1854 Last data filed at 01/28/2021 1338 Gross per 24 hour  Intake 640 ml  Output --  Net 640 ml    LBM: Last BM Date: 01/28/21 Baseline Weight: Weight: 46.4 kg Most recent weight: Weight: 42.6 kg      Palliative Assessment/Data: PPS 20%     Time In: 1800 Time Out: 1910 Time Total: 70 minutes Greater than 50%  of this time was spent counseling and coordinating care related to the above assessment and plan.  Signed by: Lavena Bullion, NP   Please contact Palliative Medicine Team phone at [phone] for questions and concerns.  For individual provider: See Shea Evans

## 2021-01-28 NOTE — Progress Notes (Signed)
[facility] Progress Note   Subjective:Seen in room, minimally responsive this AM. Nods appropriately to questions but not talking very much.      Objective Vitals:   01/27/21 2006 01/28/21 0003 01/28/21 0405 01/28/21 0740  BP: 136/60 (!) 133/57 (!) 148/59 (!) 153/74  Pulse: 85 90 87 91  Resp: '20 16 16 14  '$ Temp: 98.9 F (37.2 C) 98.7 F (37.1 C) 98.9 F (37.2 C) (!) 97.4 F (36.3 C)  TempSrc: Axillary Axillary Axillary Oral  SpO2: 95% 94% 94% 99%  Weight:      Height:       Physical Exam General: Chronically ill appearing female in NAD Heart: S1,S2 No M/R/G Lungs: CTAB Abdomen: Ascites present. Active NS, NT Extremities: No LE edema.  Dialysis Access: L AVF + T/B    Additional Objective Labs: Basic Metabolic Panel: Recent Labs  Lab 01/25/21 2114 01/26/21 0136 01/27/21 0616 01/28/21 0828  NA 139 138 137 138  K 3.1* 3.2* 3.2* 3.2*  CL 98 99 101 99  CO2 '27 28 27 30  '$ GLUCOSE 105* 97 165* 148*  BUN '13 13 11 '$ 21*  CREATININE 2.18* 2.28* 1.63* 2.59*  CALCIUM 8.9 8.5* 8.6* 9.3  PHOS 2.3*  --   --   --    Liver Function Tests: Recent Labs  Lab 01/26/21 0136 01/27/21 0616 01/28/21 0828  AST '26 22 19  '$ ALT '15 15 14  '$ ALKPHOS 66 63 73  BILITOT 1.5* 1.3* 1.4*  PROT 5.4* 5.0* 5.5*  ALBUMIN 2.4* 2.1* 2.3*   No results for input(s): LIPASE, AMYLASE in the last 168 hours. CBC: Recent Labs  Lab 01/25/21 0915 01/25/21 1524 01/26/21 0136 01/27/21 0616 01/28/21 0828  WBC 5.9 8.5 9.8 9.3 9.3  NEUTROABS 5.5 7.9*  --  8.4* 8.4*  HGB 11.0* 9.8* 9.4* 8.9* 9.5*  HCT 37.3 33.9* 30.9* 29.4* 31.1*  MCV 94.0 96.0 92.8 91.6 90.7  PLT PLATELET CLUMPS NOTED ON SMEAR, UNABLE TO ESTIMATE 88* 102* 79* 94*   Blood Culture    Component Value Date/Time   SDES PERITONEAL FLUID 01/26/2021 1354   SPECREQUEST NONE 01/26/2021 1354   CULT  01/26/2021 1354    NO GROWTH < 24 HOURS Performed at [facility] [address] [place], [place] [address]     REPTSTATUS PENDING 01/26/2021 1354    Cardiac Enzymes: No results for input(s): CKTOTAL, CKMB, CKMBINDEX, TROPONINI in the last 168 hours. CBG: Recent Labs  Lab 01/27/21 0733 01/27/21 1132 01/27/21 1654 01/27/21 2039 01/28/21 0746  GLUCAP 167* 160* 160* 182* 140*   Iron Studies: No results for input(s): IRON, TIBC, TRANSFERRIN, FERRITIN in the last 72 hours. '@lablastinr3'$ @ Studies/Results: US Paracentesis  Result Date: 01/26/2021 INDICATION: History of ESRD, ascites seen on previous ultrasound study. Request for therapeutic and diagnostic paracentesis. EXAM: ULTRASOUND GUIDED  PARACENTESIS MEDICATIONS: 10 mL 1% lidocaine COMPLICATIONS: None immediate. PROCEDURE: Informed written consent was obtained from the patient after a discussion of the risks, benefits and alternatives to treatment. A timeout was performed prior to the initiation of the procedure. Initial ultrasound scanning demonstrates a large amount of ascites within the right lower abdominal quadrant. The right lower abdomen was prepped and draped in the usual sterile fashion. 1% lidocaine was used for local anesthesia. Following this, a 19 gauge, 7-cm, Yueh catheter was introduced. An ultrasound image was saved for documentation purposes. The paracentesis was performed. The catheter was removed and a dressing was applied. The patient tolerated the procedure well without immediate  post procedural complication. FINDINGS: A total of approximately 1.1 L of hazy blood-tinged fluid was removed. Samples were sent to the laboratory as requested by the clinical team. IMPRESSION: Successful ultrasound-guided paracentesis yielding 1.1 liters of peritoneal fluid. Read by: Durenda Guthrie, PA-C Electronically Signed   By: Ruthann Cancer MD   On: 01/26/2021 13:52   Medications:  albumin human     meropenem (MERREM) IV Stopped (01/28/21 0741)    aspirin  81 mg Oral Daily   atorvastatin  40 mg Oral QHS   carvedilol  3.125 mg Oral BID WC   darbepoetin  (ARANESP) injection - DIALYSIS  60 mcg Intravenous Q Sat-HD   dextrose  12.5 g Intravenous Once   [START ON 01/29/2021] doxercalciferol  5 mcg Intravenous Q T,Th,Sa-HD   feeding supplement (NEPRO CARB STEADY)  237 mL Oral TID with meals   feeding supplement (PRO-STAT SUGAR FREE 64)  30 mL Oral q AM   FLUoxetine  20 mg Oral Daily   heparin injection (subcutaneous)  5,000 Units Subcutaneous Q8H   insulin aspart  0-6 Units Subcutaneous TID WC   levothyroxine  50 mcg Intravenous Daily   midodrine  5 mg Oral TID WC   multivitamin  1 tablet Oral QHS   omega-3 acid ethyl esters  2 capsule Oral BID   pantoprazole  80 mg Oral Daily   phosphorus  250 mg Oral Once   sodium bicarbonate  1,300 mg Oral BID   HD orders: [place] T,Th,S  3:45 hr 350/500 39.5 kg 2.0 K/ 2.0 Ca AVF -Heparin 2000 units IV TIW -Hectorol 5 mcg IV TIW -Mircera 50 mcg IV q 4 weeks (last dose 01/11/2021)   Assessment/Plan: 1. Acute encephalopathy D/T E Coli bacteremia-On meropenem per primary. Per primary  2. Ascites-S/P paracentesis 08/06 1.1 liters. No evidence of SBP. Per primary 3. Acute on Chronic HFpEF-EF 55%. Ascites present, no peripheral edema. Continue lowing volume as tolerated.  4. ESRD -T,Th,S next HD 01/29/2021 5. Anemia - HGB 9.5 Aranesp 60 mcg IV 01/26/21.  6. Secondary hyperparathyroidism - Low PO4, not on binders. C Ca High, Decrease VDRA. Recheck RFP in HD tomorrow.  7. HTN/volume -Had not been getting to EDW prior to adm. Net UF 08/06 4 liters Post wt 42 kg. Will continue lowering volume as tolerated. Continue Midodrine, hold carvedilol prior to HD.  8. Nutrition -Low albumin. DYS 2 diet-add fluid restrictions, protein supps.  9. DM per primary  Caya Soberanis H. Kariss Longmire NP-C 01/28/2021, 9:49 AM  [employer] [phone]

## 2021-01-28 NOTE — Progress Notes (Signed)
PROGRESS NOTE                                                                                                                                                                                                             Patient Demographics:    [name] Michael, is a 60 y.o. female, DOB - Mar 14, 1961, [id number]  Outpatient Primary MD for the patient is Charlott Rakes, MD    LOS - 3  Admit date - 01/25/2021    Chief Complaint  Patient presents with   Abdominal Pain       Brief Narrative (HPI from H&P)   [name] Michael is a 60 y.o. female with medical history significant of ESRD on hemodialysis, history of CVA, hypertension, type 2 diabetes mellitus, and chronic diastolic CHF who presented to ED from her SNF with complaints of altered mental status and abdominal distention, diagnosed with toxic encephalopathy with possible SBP and admitted to the hospital.   Subjective:   Patient in bed although she is in no discomfort but still appears extremely weak and cachectic, third spaced fluid, able to answer basic questions and denies any headache, does have some generalized lower abdominal discomfort, no shortness of breath.  Overall poor historian.   Assessment  & Plan :     Acute toxic encephalopathy due to E. coli bacteremia suspicious source likely UTI, ruled out SBP - she is on appropriate IV meropenem as she is now growing ESBL E. coli in her blood, source likely UTI, paracentesis not suggestive of SBP, SAAG score under 1 with minimal WBCs.  Overall still extremely guarded.  Continue to monitor.  CT head is nonacute and there are no focal deficits.  Mentation ever so slightly improved on 01/28/2021.  Note her overall health condition appears extremely poor, she is DNR and we will involve palliative care for long-term goals of care.  2.  ESRD with abdominal ascites, acute respiratory distress with hypoxia - she is on TTS schedule,  uses as needed oxygen at SNF, likely fluid overload and ascites compromising her respiratory status due to diaphragmatic pressure.  Nephrology team is following had 3 L of fluid removed with HD on 01/26/2021.  3.  Acute on chronic diastolic CHF last EF [id number] on recent echocardiogram.  Fluid removal through HD and ascites fluid removal through paracentesis  4.  Anemia  of chronic disease.  Stable.  5.  Chronic thrombocytopenia.  Stable monitor.  6.  Hypertension.  Coreg as tolerated, she also takes midodrine intermittently.  If blood pressure remains soft will stop Coreg altogether.  7.  Hypophosphatemia.  Replaced.  8.  Lactic acidosis due to renal failure and ESRD.  Blood pressure stable no signs of hypoperfusion will monitor.  9.  Hypothyroidism with stable TSH currently on IV Synthroid.  10.  Severe protein calorie malnutrition with massive third spacing of fluid.  Supportive care protein supplements once she is more arousable and awake.    11.DM type II.  Sliding scale.  Lab Results  Component Value Date   HGBA1C 5.9 (H) 10/14/2020   CBG (last 3)  Recent Labs    01/27/21 1654 01/27/21 2039 01/28/21 0746  GLUCAP 160* 182* 140*   Lab Results  Component Value Date   TSH 5.240 (H) 01/26/2021         Condition - Extremely Guarded  Family Communication  :   Riley Churches on [id number] on 01/26/2021    Niece Larene Beach [id number] on 01/26/2021 at 12:19 PM message left   Code Status :  DNR  Consults  :  Renal, IR  PUD Prophylaxis : PPI   Procedures  :      US Paracentesis with 1.1 L of fluid removed  CT head.  Nonacute.    CT chest abdomen pelvis.- Suboptimal evaluation, within this constraint; 1. No acute thoracic or abdominopelvic findings. 2. Cardiomegaly. 3. Small volume right pleural effusion with adjacent basilar consolidation, likely atelectasis though early pneumonia can appear similar. 4. Nonobstructing left nephrolithiasis.  No hydronephrosis. 5. Moderate volume  intra-abdominal ascites. 6. Addition and senescent findings, as above. Aortic Atherosclerosis      Disposition Plan  :    Status is: Inpatient  Remains inpatient appropriate because:IV treatments appropriate due to intensity of illness or inability to take PO  Dispo: The patient is from: SNF              Anticipated d/c is to: SNF              Patient currently is not medically stable to d/c.   Difficult to place patient No   DVT Prophylaxis  :  Heparin added  heparin injection 5,000 Units Start: 01/26/21 1400 SCDs Start: 01/25/21 1646   Lab Results  Component Value Date   PLT 94 (L) 01/28/2021    Diet :  Diet Order             DIET DYS 2 Room service appropriate? Yes with Assist; Fluid consistency: Thin; Fluid restriction: 1200 mL Fluid  Diet effective now                    Inpatient Medications  Scheduled Meds:  (feeding supplement) PROSource Plus  30 mL Oral BID BM   aspirin  81 mg Oral Daily   atorvastatin  40 mg Oral QHS   carvedilol  3.125 mg Oral BID WC   Chlorhexidine Gluconate Cloth  6 each Topical Q0600   darbepoetin (ARANESP) injection - DIALYSIS  60 mcg Intravenous Q Sat-HD   dextrose  12.5 g Intravenous Once   [START ON 01/29/2021] doxercalciferol  4 mcg Intravenous Q T,Th,Sa-HD   feeding supplement (NEPRO CARB STEADY)  237 mL Oral TID with meals   feeding supplement (PRO-STAT SUGAR FREE 64)  30 mL Oral q AM   FLUoxetine  20 mg Oral Daily  heparin injection (subcutaneous)  5,000 Units Subcutaneous Q8H   insulin aspart  0-6 Units Subcutaneous TID WC   levothyroxine  50 mcg Intravenous Daily   midodrine  5 mg Oral TID WC   multivitamin  1 tablet Oral QHS   omega-3 acid ethyl esters  2 capsule Oral BID   pantoprazole  80 mg Oral Daily   phosphorus  250 mg Oral Once   sodium bicarbonate  1,300 mg Oral BID   Continuous Infusions:  sodium chloride     sodium chloride     albumin human     meropenem (MERREM) IV Stopped (01/28/21 0741)   PRN  Meds:.sodium chloride, sodium chloride, acetaminophen **OR** [DISCONTINUED] acetaminophen, albumin human, cloNIDine, heparin, heparin, hydrALAZINE, lidocaine (PF), lidocaine-prilocaine, pentafluoroprop-tetrafluoroeth  Antibiotics  :    Anti-infectives (From admission, onward)    Start     Dose/Rate Route Frequency Ordered Stop   01/26/21 2200  meropenem (MERREM) 500 mg in sodium chloride 0.9 % 100 mL IVPB        500 mg 200 mL/hr over 30 Minutes Intravenous Every 24 hours 01/25/21 2151     01/26/21 1800  ceFEPIme (MAXIPIME) 2 g in sodium chloride 0.9 % 100 mL IVPB  Status:  Discontinued        2 g 200 mL/hr over 30 Minutes Intravenous Every T-Th-Sa (1800) 01/25/21 1149 01/25/21 2149   01/25/21 2245  meropenem (MERREM) 1 g in sodium chloride 0.9 % 100 mL IVPB        1 g 200 mL/hr over 30 Minutes Intravenous  Once 01/25/21 2149 01/25/21 2334   01/25/21 2000  metroNIDAZOLE (FLAGYL) IVPB 500 mg  Status:  Discontinued       Note to Pharmacy: 8 hours from last dose   500 mg 100 mL/hr over 60 Minutes Intravenous Every 8 hours 01/25/21 1945 01/25/21 2149   01/25/21 1015  ceFEPIme (MAXIPIME) 2 g in sodium chloride 0.9 % 100 mL IVPB        2 g 200 mL/hr over 30 Minutes Intravenous  Once 01/25/21 1005 01/25/21 1242   01/25/21 1015  metroNIDAZOLE (FLAGYL) IVPB 500 mg        500 mg 100 mL/hr over 60 Minutes Intravenous  Once 01/25/21 1005 01/25/21 1212        Time Spent in minutes  30   Lala Lund M.D on 01/28/2021 at 11:52 AM  To page go to [email or web address]   [facility] -  Office  [phone]   See all Orders from today for further details    Objective:   Vitals:   01/28/21 0003 01/28/21 0405 01/28/21 0740 01/28/21 1141  BP: (!) 133/57 (!) 148/59 (!) 153/74 (!) 169/70  Pulse: 90 87 91   Resp: '16 16 14 20  '$ Temp: 98.7 F (37.1 C) 98.9 F (37.2 C) (!) 97.4 F (36.3 C) 98.2 F (36.8 C)  TempSrc: Axillary Axillary Oral Axillary  SpO2: 94% 94% 99% 91%  Weight:       Height:        Wt Readings from Last 3 Encounters:  01/27/21 42.6 kg  01/23/21 46.4 kg  01/22/21 46.3 kg     Intake/Output Summary (Last 24 hours) at 01/28/2021 1152 Last data filed at 01/27/2021 2300 Gross per 24 hour  Intake 640 ml  Output --  Net 640 ml     Physical Exam  Overall confused, unable to answer questions or follow commands appropriately, appears cachectic but third spaced fluid [place].AT,PERRAL Supple Neck,No JVD,  No cervical lymphadenopathy appriciated.  Symmetrical Chest wall movement, Good air movement bilaterally, CTAB RRR,No Gallops, Rubs or new Murmurs, No Parasternal Heave +ve B.Sounds, Abd Soft, No tenderness, No organomegaly appriciated, No rebound - guarding or rigidity. No Cyanosis,       Data Review:    CBC Recent Labs  Lab 01/25/21 0915 01/25/21 1524 01/26/21 0136 01/27/21 0616 01/28/21 0828  WBC 5.9 8.5 9.8 9.3 9.3  HGB 11.0* 9.8* 9.4* 8.9* 9.5*  HCT 37.3 33.9* 30.9* 29.4* 31.1*  PLT PLATELET CLUMPS NOTED ON SMEAR, UNABLE TO ESTIMATE 88* 102* 79* 94*  MCV 94.0 96.0 92.8 91.6 90.7  MCH 27.7 27.8 28.2 27.7 27.7  MCHC 29.5* 28.9* 30.4 30.3 30.5  RDW 21.6* 21.9* 21.4* 21.1* 20.8*  LYMPHSABS 0.3* 0.3*  --  0.3* 0.3*  MONOABS 0.1 0.3  --  0.4 0.5  EOSABS 0.0 0.0  --  0.1 0.1  BASOSABS 0.0 0.0  --  0.0 0.0    Recent Labs  Lab 01/25/21 0857 01/25/21 0915 01/25/21 0926 01/25/21 1057 01/25/21 2114 01/26/21 0136 01/26/21 0726 01/27/21 0616 01/28/21 0828  NA  --  138  --   --  139 138  --  137 138  K  --  2.7*  --   --  3.1* 3.2*  --  3.2* 3.2*  CL  --  96*  --   --  98 99  --  101 99  CO2  --  31  --   --  27 28  --  27 30  GLUCOSE  --  173*  --   --  105* 97  --  165* 148*  BUN  --  8  --   --  13 13  --  11 21*  CREATININE  --  2.02*  --   --  2.18* 2.28*  --  1.63* 2.59*  CALCIUM  --  8.7*  --   --  8.9 8.5*  --  8.6* 9.3  AST  --  33  --   --   --  26  --  22 19  ALT  --  17  --   --   --  15  --  15 14  ALKPHOS  --  91  --    --   --  66  --  63 73  BILITOT  --  1.8*  --   --   --  1.5*  --  1.3* 1.4*  ALBUMIN  --  3.1*  --   --   --  2.4*  --  2.1* 2.3*  MG  --   --   --   --  1.7  --   --   --   --   PROCALCITON  --   --   --   --   --   --  7.20 8.35 6.94  LATICACIDVEN 2.6*  --   --  3.0*  --   --   --   --   --   INR  --  1.5*  --   --   --  1.8*  --   --   --   TSH  --   --   --   --   --   --  5.240*  --   --   AMMONIA  --   --  34  --   --   --   --   --   --     ------------------------------------------------------------------------------------------------------------------  No results for input(s): CHOL, HDL, LDLCALC, TRIG, CHOLHDL, LDLDIRECT in the last 72 hours.  Lab Results  Component Value Date   HGBA1C 5.9 (H) 10/14/2020   ------------------------------------------------------------------------------------------------------------------ Recent Labs    01/26/21 0726  TSH 5.240*    Cardiac Enzymes No results for input(s): CKMB, TROPONINI, MYOGLOBIN in the last 168 hours.  Invalid input(s): CK ------------------------------------------------------------------------------------------------------------------    Component Value Date/Time   BNP 1,275.7 (H) 12/03/2019 0329    Radiology Reports CT Abdomen Pelvis Wo Contrast  Result Date: 01/25/2021 CLINICAL DATA:  Abdominal distension EXAM: CT CHEST, ABDOMEN AND PELVIS WITHOUT CONTRAST TECHNIQUE: Multidetector CT imaging of the chest, abdomen and pelvis was performed following the standard protocol without IV contrast. COMPARISON:  Pelvis radiograph, 10/13/2020. CT abdomen pelvis, 10/05/2020. FINDINGS: The examination is limited secondary to lack of intravenous contrast, and significant motion artifact-most pronounced at the level the chest. CT CHEST FINDINGS Cardiovascular: Cardiomegaly with 4-chamber cardiac enlargement. Multi vessel coronary vascular calcifications. Mediastinum/Nodes: No enlarged mediastinal, hilar, or axillary lymph nodes.  Thyroid gland, trachea, and esophagus demonstrate no significant findings. Lungs/Pleura: Small volume right pleural effusion, with adjacent dependent atelectasis. Musculoskeletal: Healing left fifth anterior rib fracture CT ABDOMEN PELVIS FINDINGS Hepatobiliary: No focal liver abnormality is seen. No gallstones, gallbladder wall thickening, or biliary dilatation. Pancreas: Atrophic.  No pancreatic duct dilation. Spleen: Normal in size without focal abnormality. Adrenals/Urinary Tract: Adrenal glands are unremarkable. Perirenal stranding. Punctate nonobstructing nephroliths within the left renal collecting system. No hydronephrosis. Bladder is unremarkable. Stomach/Bowel: Stomach is within normal limits. Appendix appears normal. No evidence of bowel wall thickening, distention, or inflammatory changes. Vascular/Lymphatic: Aortic atherosclerosis. No aneurysmal dilation. No enlarged abdominal or pelvic lymph nodes. Reproductive: Uterus and bilateral adnexa are unremarkable. Other: Moderate volume of intra-abdominal ascites, as seen along the dependent pelvis, pericolic gutters, perihepatic and perisplenic spaces. Musculoskeletal: Diffuse subcutaneous edema. No acute osseous abnormality. IMPRESSION: Suboptimal evaluation, within this constraint; 1. No acute thoracic or abdominopelvic findings. 2. Cardiomegaly. 3. Small volume right pleural effusion with adjacent basilar consolidation, likely atelectasis though early pneumonia can appear similar. 4. Nonobstructing left nephrolithiasis.  No hydronephrosis. 5. Moderate volume intra-abdominal ascites. 6. Addition and senescent findings, as above. Aortic Atherosclerosis (ICD10-I70.0). Electronically Signed   By: Michaelle Birks MD   On: 01/25/2021 12:11   DG Chest 1 View  Result Date: 01/22/2021 CLINICAL DATA:  Fall hypoxia EXAM: CHEST  1 VIEW COMPARISON:  10/13/2020 FINDINGS: Cardiomegaly with aortic atherosclerosis. No focal opacity or pleural effusion. No pneumothorax.  IMPRESSION: No active disease.  Cardiomegaly Electronically Signed   By: Donavan Foil M.D.   On: 01/22/2021 18:57   DG Thoracic Spine 2 View  Result Date: 01/22/2021 CLINICAL DATA:  Fall EXAM: THORACIC SPINE 2 VIEWS COMPARISON:  Thoracic spine radiograph 02/29/2020 FINDINGS: There is no radiographically evident thoracic spine fracture. Mild multilevel degenerative changes. IMPRESSION: No radiographically evident thoracic spine fracture. Mild multilevel degenerative changes. Electronically Signed   By: Maurine Simmering   On: 01/22/2021 18:58   CT Head Wo Contrast  Result Date: 01/25/2021 CLINICAL DATA:  Mental status change, unknown cause EXAM: CT HEAD WITHOUT CONTRAST TECHNIQUE: Contiguous axial images were obtained from the base of the skull through the vertex without intravenous contrast. COMPARISON:  CT October 13, 2020. FINDINGS: Motion limited study.  Within this limitation: Brain: No evidence of acute large vascular territory infarction, hemorrhage, hydrocephalus, extra-axial collection or mass lesion/mass effect. Similar remote lacunar infarcts in the bilateral basal ganglia and thalami. Similar additional patchy white matter hypoattenuation,  likely related to chronic microvascular ischemic disease. Vascular: No hyperdense vessel identified. Skull: No acute fracture. Sinuses/Orbits: Visualized sinuses are clear. Similar exophthalmos without acute orbital finding. Other: No sizable mastoid effusions. IMPRESSION: 1. No evidence of acute intracranial abnormality on this motion study. 2. Similar remote lacunar infarcts in bilateral basal ganglia and thalami and chronic microvascular ischemic disease. Electronically Signed   By: Margaretha Sheffield MD   On: 01/25/2021 14:20   CT Chest Wo Contrast  Result Date: 01/25/2021 CLINICAL DATA:  Abdominal distension EXAM: CT CHEST, ABDOMEN AND PELVIS WITHOUT CONTRAST TECHNIQUE: Multidetector CT imaging of the chest, abdomen and pelvis was performed following the standard  protocol without IV contrast. COMPARISON:  Pelvis radiograph, 10/13/2020. CT abdomen pelvis, 10/05/2020. FINDINGS: The examination is limited secondary to lack of intravenous contrast, and significant motion artifact-most pronounced at the level the chest. CT CHEST FINDINGS Cardiovascular: Cardiomegaly with 4-chamber cardiac enlargement. Multi vessel coronary vascular calcifications. Mediastinum/Nodes: No enlarged mediastinal, hilar, or axillary lymph nodes. Thyroid gland, trachea, and esophagus demonstrate no significant findings. Lungs/Pleura: Small volume right pleural effusion, with adjacent dependent atelectasis. Musculoskeletal: Healing left fifth anterior rib fracture CT ABDOMEN PELVIS FINDINGS Hepatobiliary: No focal liver abnormality is seen. No gallstones, gallbladder wall thickening, or biliary dilatation. Pancreas: Atrophic.  No pancreatic duct dilation. Spleen: Normal in size without focal abnormality. Adrenals/Urinary Tract: Adrenal glands are unremarkable. Perirenal stranding. Punctate nonobstructing nephroliths within the left renal collecting system. No hydronephrosis. Bladder is unremarkable. Stomach/Bowel: Stomach is within normal limits. Appendix appears normal. No evidence of bowel wall thickening, distention, or inflammatory changes. Vascular/Lymphatic: Aortic atherosclerosis. No aneurysmal dilation. No enlarged abdominal or pelvic lymph nodes. Reproductive: Uterus and bilateral adnexa are unremarkable. Other: Moderate volume of intra-abdominal ascites, as seen along the dependent pelvis, pericolic gutters, perihepatic and perisplenic spaces. Musculoskeletal: Diffuse subcutaneous edema. No acute osseous abnormality. IMPRESSION: Suboptimal evaluation, within this constraint; 1. No acute thoracic or abdominopelvic findings. 2. Cardiomegaly. 3. Small volume right pleural effusion with adjacent basilar consolidation, likely atelectasis though early pneumonia can appear similar. 4. Nonobstructing  left nephrolithiasis.  No hydronephrosis. 5. Moderate volume intra-abdominal ascites. 6. Addition and senescent findings, as above. Aortic Atherosclerosis (ICD10-I70.0). Electronically Signed   By: Michaelle Birks MD   On: 01/25/2021 12:11   US Paracentesis  Result Date: 01/26/2021 INDICATION: History of ESRD, ascites seen on previous ultrasound study. Request for therapeutic and diagnostic paracentesis. EXAM: ULTRASOUND GUIDED  PARACENTESIS MEDICATIONS: 10 mL 1% lidocaine COMPLICATIONS: None immediate. PROCEDURE: Informed written consent was obtained from the patient after a discussion of the risks, benefits and alternatives to treatment. A timeout was performed prior to the initiation of the procedure. Initial ultrasound scanning demonstrates a large amount of ascites within the right lower abdominal quadrant. The right lower abdomen was prepped and draped in the usual sterile fashion. 1% lidocaine was used for local anesthesia. Following this, a 19 gauge, 7-cm, Yueh catheter was introduced. An ultrasound image was saved for documentation purposes. The paracentesis was performed. The catheter was removed and a dressing was applied. The patient tolerated the procedure well without immediate post procedural complication. FINDINGS: A total of approximately 1.1 L of hazy blood-tinged fluid was removed. Samples were sent to the laboratory as requested by the clinical team. IMPRESSION: Successful ultrasound-guided paracentesis yielding 1.1 liters of peritoneal fluid. Read by: Durenda Guthrie, PA-C Electronically Signed   By: Ruthann Cancer MD   On: 01/26/2021 13:52   DG Chest Port 1 View  Result Date: 01/25/2021 CLINICAL DATA:  Questionable  sepsis EXAM: PORTABLE CHEST 1 VIEW COMPARISON:  Three days ago FINDINGS: Chronic cardiomegaly. Low volume chest. There is no edema, consolidation, effusion, or pneumothorax. Mild atelectasis or scarring in the left mid chest. Extensive artifact from EKG leads. IMPRESSION: No focal  pneumonia. Cardiomegaly. Electronically Signed   By: Monte Fantasia M.D.   On: 01/25/2021 09:24   [name] ASCITES (ABDOMEN LIMITED)  Result Date: 01/25/2021 CLINICAL DATA:  Assess for ascites EXAM: LIMITED ABDOMEN ULTRASOUND FOR ASCITES TECHNIQUE: Limited ultrasound survey for ascites was performed in all four abdominal quadrants. COMPARISON:  CT 01/25/2021 FINDINGS: Targeted sonography of the 4 quadrants is performed. Moderate ascites is visualized with largest pocket in the right lower quadrant. IMPRESSION: Moderate ascites Electronically Signed   By: Donavan Foil M.D.   On: 01/25/2021 17:53

## 2021-01-28 NOTE — Progress Notes (Signed)
Pharmacy Antibiotic Note  [name] Michael is a 60 y.o. female admitted on 01/25/2021 with bacteremia and UTI.  Pharmacy has been consulted for meropenem dosing. Patient with ESRD on HD  Plan: Continue Meropenem '500mg'$  IV q24h Monitor for LOT, clinical course  Height: '4\' 6"'$  (137.2 cm) Weight: 42.6 kg (93 lb 14.7 oz) IBW/kg (Calculated) : 31.7  Temp (24hrs), Avg:98.6 F (37 C), Min:97.4 F (36.3 C), Max:99 F (37.2 C)  Recent Labs  Lab 01/25/21 0857 01/25/21 0915 01/25/21 1057 01/25/21 1524 01/25/21 2114 01/26/21 0136 01/27/21 0616  WBC  --  5.9  --  8.5  --  9.8 9.3  CREATININE  --  2.02*  --   --  2.18* 2.28* 1.63*  LATICACIDVEN 2.6*  --  3.0*  --   --   --   --     Estimated Creatinine Clearance: 20.9 mL/min (A) (by C-G formula based on SCr of 1.63 mg/dL (H)).    Allergies  Allergen Reactions   Hydrocodone Nausea And Vomiting and Other (See Comments)    "Allergic," per [facility]    Antimicrobials this admission:    Dose adjustments this admission: Cefepime x 1 8/5 Flagyl x 1 8/5 Meropenem 8/5 >>  Microbiology results: 8/5 BCx: ESBL E.coli 8/5 UCx: ESBL E.coli    Lilla Callejo A. Levada Dy, PharmD, BCPS, FNKF Clinical Pharmacist [place] Please utilize Amion for appropriate phone number to reach the unit pharmacist ([place])  01/28/2021 8:39 AM

## 2021-01-28 NOTE — [place] FL2 (Signed)
[place] MEDICAID FL2 LEVEL OF CARE SCREENING TOOL     IDENTIFICATION  Patient Name: [name] Michael Birthdate: April 09, 1961 Sex: female Admission Date (Current Location): 01/25/2021  [facility] and [place] Number:  Herbalist and Address:  [facility] [place], [place] [address] [place], [place] [address]      Provider Number: [id number]  Attending Physician Name and Address:  Thurnell Lose, MD  Relative Name and Phone Number:       Current Level of Care: Hospital Recommended Level of Care: [place] Prior Approval Number:    Date Approved/Denied:   PASRR Number:    Discharge Plan: SNF    Current Diagnoses: Patient Active Problem List   Diagnosis Date Noted   Abdominal ascites 01/25/2021   Lymphocytopenia 10/13/2020   Ear hematoma, right, initial encounter 10/13/2020   Prolonged QT interval 10/13/2020   Lactic acidosis 10/05/2020   Transaminitis 10/05/2020   Thrombocytopenia ([place]) 10/05/2020   Acute respiratory failure with hypoxia ([place]) 10/05/2020   Altered mental status 0000000   Acute metabolic encephalopathy [id number]   Severe tricuspid regurgitation 12/04/2019   ESRD (end stage renal disease) ([place])    GERD (gastroesophageal reflux disease)    Hypertension    Type II diabetes mellitus ([place])    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   AMS (altered mental status) 04/28/2019   Hypoxemia 04/28/2019   Fall 06/09/2018   Fall at home, initial encounter 06/09/2018   Anemia of chronic disease 06/09/2018   Syncope and collapse 06/09/2018   Acute encephalopathy 03/10/2018   Hypermagnesemia 03/10/2018   Acute lower UTI 11/09/2016   Uncontrolled type 2 diabetes mellitus with complication ([place])    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 associated with diabetes mellitus due to underlying condition (HCC)    Chronic diastolic CHF (congestive heart failure) ([place])    Hypokalemia 06/11/2016   Proliferative diabetic  retinopathy ([place]) 04/29/2016   Poor social situation 03/09/2013   Depression 03/01/2013   Abnormal mammogram 12/20/2012   Retinal detachment 11/17/2012   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 ([place]) [id number]   Hyperlipidemia 02/09/2007   Essential hypertension 02/09/2007    Orientation RESPIRATION BLADDER Height & Weight     Self  Normal (Non-productive cough.) Incontinent, External catheter Weight: 42.6 kg Height:  '4\' 6"'$  (137.2 cm)  BEHAVIORAL SYMPTOMS/MOOD NEUROLOGICAL BOWEL NUTRITION STATUS      Incontinent Diet (Please see D/C summary)  AMBULATORY STATUS COMMUNICATION OF NEEDS Skin   Extensive Assist Verbally Bruising (Abdominal)                       Personal Care Assistance Level of Assistance  Bathing, Dressing, Feeding Bathing Assistance: Limited assistance Feeding assistance: Limited assistance Dressing Assistance: Limited assistance     Functional Limitations Info  Speech     Speech Info: Impaired (Delayed responses)    SPECIAL CARE FACTORS FREQUENCY  PT (By licensed PT), OT (By licensed OT)     PT Frequency: 2 x/week OT Frequency: 2 x/week            Contractures Contractures Info: Not present    Additional Factors Info  Code Status, Allergies, Isolation Precautions, Insulin Sliding Scale, Psychotropic (ESBL) Code Status Info: DNR Allergies Info: hydorcodone Psychotropic Info: Prozac, Clonidine, Insulin Sliding Scale Info: Novolog Isolation Precautions Info: ESBL     Current Medications (01/28/2021):  This is the current hospital active medication list Current Facility-Administered Medications  Medication Dose Route Frequency Provider Last Rate Last Admin   (feeding supplement) PROSource Plus liquid 30 mL  30 mL Oral BID BM Valentina Gu, NP       0.9 %  sodium chloride infusion  100 mL Intravenous PRN Elmarie Shiley, MD       0.9 %  sodium chloride infusion  100 mL Intravenous PRN Elmarie Shiley, MD        acetaminophen (TYLENOL) tablet 650 mg  650 mg Oral Q6H PRN Orma Flaming, MD       albumin human 25 % solution 50 g  50 g Intravenous Once PRN Thurnell Lose, MD       aspirin chewable tablet 81 mg  81 mg Oral Daily Thurnell Lose, MD   81 mg at 01/28/21 0951   atorvastatin (LIPITOR) tablet 40 mg  40 mg Oral QHS Orma Flaming, MD   40 mg at 01/27/21 2221   carvedilol (COREG) tablet 3.125 mg  3.125 mg Oral BID WC Valentina Gu, NP   3.125 mg at 01/28/21 0818   Chlorhexidine Gluconate Cloth 2 % PADS 6 each  6 each Topical Q0600 Valentina Gu, NP       cloNIDine (CATAPRES) tablet 0.1 mg  0.1 mg Oral Q8H PRN Orma Flaming, MD       Darbepoetin Alfa (ARANESP) injection 60 mcg  60 mcg Intravenous Q Sat-HD Ernest Haber, PA-C   60 mcg at 01/26/21 1632   dextrose 50 % solution 12.5 g  12.5 g Intravenous Once Thurnell Lose, MD       [START ON 01/29/2021] doxercalciferol (HECTOROL) injection 4 mcg  4 mcg Intravenous Q T,Th,Sa-HD Valentina Gu, NP       feeding supplement (NEPRO CARB STEADY) liquid 237 mL  237 mL Oral TID with meals Orma Flaming, MD 0 mL/hr at 01/26/21 1930 237 mL at 01/28/21 0818   feeding supplement (PRO-STAT SUGAR FREE 64) liquid 30 mL  30 mL Oral q AM Orma Flaming, MD       FLUoxetine (PROZAC) capsule 20 mg  20 mg Oral Daily Orma Flaming, MD   20 mg at 01/28/21 0950   heparin injection 1,000 Units  1,000 Units Dialysis PRN Elmarie Shiley, MD       heparin injection 1,900 Units  40 Units/kg Dialysis PRN Elmarie Shiley, MD       heparin injection 5,000 Units  5,000 Units Subcutaneous Q8H Thurnell Lose, MD   5,000 Units at 01/28/21 [id number]   hydrALAZINE (APRESOLINE) injection 5 mg  5 mg Intravenous Q6H PRN Orma Flaming, MD       insulin aspart (novoLOG) injection 0-6 Units  0-6 Units Subcutaneous TID [facility] Orma Flaming, MD   1 Units at 01/28/21 1232   levothyroxine (SYNTHROID, LEVOTHROID) injection 50 mcg  50 mcg Intravenous Daily Thurnell Lose, MD   50  mcg at 01/28/21 0951   lidocaine (PF) (XYLOCAINE) 1 % injection 5 mL  5 mL Intradermal PRN Elmarie Shiley, MD       lidocaine-prilocaine (EMLA) cream 1 application  1 application Topical PRN Elmarie Shiley, MD       meropenem (MERREM) 500 mg in sodium chloride 0.9 % 100 mL IVPB  500 mg Intravenous Q24H Orma Flaming, MD   Stopping Infusion hung by another clincian at 01/28/21 0741   midodrine (PROAMATINE) tablet 5 mg  5 mg Oral TID WC Thurnell Lose, MD   5 mg at 01/28/21 [phone]  multivitamin (RENA-VIT) tablet 1 tablet  1 tablet Oral QHS Orma Flaming, MD   1 tablet at 01/27/21 2221   omega-3 acid ethyl esters (LOVAZA) capsule 2 g  2 capsule Oral BID Orma Flaming, MD   2 g at 01/28/21 0949   pantoprazole (PROTONIX) EC tablet 80 mg  80 mg Oral Daily Orma Flaming, MD   80 mg at 01/28/21 [id number]   pentafluoroprop-tetrafluoroeth (GEBAUERS) aerosol 1 application  1 application Topical PRN Elmarie Shiley, MD       phosphorus (K PHOS NEUTRAL) tablet 250 mg  250 mg Oral Once Thurnell Lose, MD       sodium bicarbonate tablet 1,300 mg  1,300 mg Oral BID Orma Flaming, MD   1,300 mg at 01/28/21 [id number]     Discharge Medications: Please see discharge summary for a list of discharge medications.  Relevant Imaging Results:  Relevant Lab Results:   Additional Information [id number]  Tom-Johnson, Renea Ee, RN

## 2021-01-29 LAB — CBC WITH DIFFERENTIAL/PLATELET
Abs Immature Granulocytes: 0.03 10*3/uL (ref 0.00–0.07)
Basophils Absolute: 0 10*3/uL (ref 0.0–0.1)
Basophils Relative: 0 %
Eosinophils Absolute: 0.1 10*3/uL (ref 0.0–0.5)
Eosinophils Relative: 2 %
HCT: 30 % — ABNORMAL LOW (ref 36.0–46.0)
Hemoglobin: 9.2 g/dL — ABNORMAL LOW (ref 12.0–15.0)
Immature Granulocytes: 1 %
Lymphocytes Relative: 4 %
Lymphs Abs: 0.3 10*3/uL — ABNORMAL LOW (ref 0.7–4.0)
MCH: 27.9 pg (ref 26.0–34.0)
MCHC: 30.7 g/dL (ref 30.0–36.0)
MCV: 90.9 fL (ref 80.0–100.0)
Monocytes Absolute: 0.6 10*3/uL (ref 0.1–1.0)
Monocytes Relative: 9 %
Neutro Abs: 5 10*3/uL (ref 1.7–7.7)
Neutrophils Relative %: 84 %
Platelets: 88 10*3/uL — ABNORMAL LOW (ref 150–400)
RBC: 3.3 MIL/uL — ABNORMAL LOW (ref 3.87–5.11)
RDW: 20.7 % — ABNORMAL HIGH (ref 11.5–15.5)
WBC: 6 10*3/uL (ref 4.0–10.5)
nRBC: 0.5 % — ABNORMAL HIGH (ref 0.0–0.2)

## 2021-01-29 LAB — RENAL FUNCTION PANEL
Albumin: 2.1 g/dL — ABNORMAL LOW (ref 3.5–5.0)
Anion gap: 7 (ref 5–15)
BUN: 30 mg/dL — ABNORMAL HIGH (ref 6–20)
CO2: 31 mmol/L (ref 22–32)
Calcium: 9.7 mg/dL (ref 8.9–10.3)
Chloride: 98 mmol/L (ref 98–111)
Creatinine, Ser: 3.09 mg/dL — ABNORMAL HIGH (ref 0.44–1.00)
GFR, Estimated: 17 mL/min — ABNORMAL LOW (ref >60)
Glucose, Bld: 185 mg/dL — ABNORMAL HIGH (ref 70–99)
Phosphorus: 1.4 mg/dL — ABNORMAL LOW (ref 2.5–4.6)
Potassium: 3.3 mmol/L — ABNORMAL LOW (ref 3.5–5.1)
Sodium: 136 mmol/L (ref 135–145)

## 2021-01-29 LAB — COMPREHENSIVE METABOLIC PANEL
ALT: 15 U/L (ref 0–44)
AST: 26 U/L (ref 15–41)
Albumin: 2.2 g/dL — ABNORMAL LOW (ref 3.5–5.0)
Alkaline Phosphatase: 79 U/L (ref 38–126)
Anion gap: 9 (ref 5–15)
BUN: 28 mg/dL — ABNORMAL HIGH (ref 6–20)
CO2: 30 mmol/L (ref 22–32)
Calcium: 9.7 mg/dL (ref 8.9–10.3)
Chloride: 98 mmol/L (ref 98–111)
Creatinine, Ser: 3.07 mg/dL — ABNORMAL HIGH (ref 0.44–1.00)
GFR, Estimated: 17 mL/min — ABNORMAL LOW (ref >60)
Glucose, Bld: 161 mg/dL — ABNORMAL HIGH (ref 70–99)
Potassium: 3.1 mmol/L — ABNORMAL LOW (ref 3.5–5.1)
Sodium: 137 mmol/L (ref 135–145)
Total Bilirubin: 1 mg/dL (ref 0.3–1.2)
Total Protein: 5.4 g/dL — ABNORMAL LOW (ref 6.5–8.1)

## 2021-01-29 LAB — CBC
HCT: 30.8 % — ABNORMAL LOW (ref 36.0–46.0)
Hemoglobin: 9.1 g/dL — ABNORMAL LOW (ref 12.0–15.0)
MCH: 27 pg (ref 26.0–34.0)
MCHC: 29.5 g/dL — ABNORMAL LOW (ref 30.0–36.0)
MCV: 91.4 fL (ref 80.0–100.0)
Platelets: 86 10*3/uL — ABNORMAL LOW (ref 150–400)
RBC: 3.37 MIL/uL — ABNORMAL LOW (ref 3.87–5.11)
RDW: 20.7 % — ABNORMAL HIGH (ref 11.5–15.5)
WBC: 5.6 10*3/uL (ref 4.0–10.5)
nRBC: 0 % (ref 0.0–0.2)

## 2021-01-29 LAB — PATHOLOGIST SMEAR REVIEW

## 2021-01-29 LAB — PROCALCITONIN: Procalcitonin: 5.04 ng/mL

## 2021-01-29 LAB — GLUCOSE, CAPILLARY
Glucose-Capillary: 176 mg/dL — ABNORMAL HIGH (ref 70–99)
Glucose-Capillary: 167 mg/dL — ABNORMAL HIGH (ref 70–99)
Glucose-Capillary: 94 mg/dL (ref 70–99)
Glucose-Capillary: 123 mg/dL — ABNORMAL HIGH (ref 70–99)

## 2021-01-29 MED ORDER — LIDOCAINE HCL (PF) 1 % IJ SOLN: 5.0000 mL | INTRAMUSCULAR | Status: DC | PRN

## 2021-01-29 MED ORDER — SODIUM CHLORIDE 0.9 % IV SOLN: 100.0000 mL | INTRAVENOUS | Status: DC | PRN

## 2021-01-29 MED ORDER — DOXERCALCIFEROL 4 MCG/2ML IV SOLN
INTRAVENOUS | Status: AC
  Filled 2021-01-29: qty 2

## 2021-01-29 MED ORDER — HEPARIN SODIUM (PORCINE) 1000 UNIT/ML DIALYSIS: 1000.0000 [IU] | INTRAMUSCULAR | Status: DC | PRN

## 2021-01-29 MED ORDER — ALTEPLASE 2 MG IJ SOLR: 2.0000 mg | Freq: Once | INTRAMUSCULAR | Status: DC | PRN

## 2021-01-29 MED ORDER — HEPARIN SODIUM (PORCINE) 1000 UNIT/ML DIALYSIS
2000.0000 [IU] | Freq: Once | INTRAMUSCULAR | Status: AC
  Administered 2021-01-29: 2000 [IU] via INTRAVENOUS_CENTRAL

## 2021-01-29 MED ORDER — LIDOCAINE-PRILOCAINE 2.5-2.5 % EX CREA: 1.0000 "application " | TOPICAL_CREAM | CUTANEOUS | Status: DC | PRN

## 2021-01-29 MED ORDER — PENTAFLUOROPROP-TETRAFLUOROETH EX AERO: 1.0000 "application " | INHALATION_SPRAY | CUTANEOUS | Status: DC | PRN

## 2021-01-29 NOTE — Progress Notes (Signed)
PROGRESS NOTE                                                                                                                                                                                                             Patient Demographics:    [name] Michael, is a 60 y.o. female, DOB - [birth year], [id number]  Outpatient Primary MD for the patient is Charlott Rakes, MD    LOS - 4  Admit date - 01/25/2021    Chief Complaint  Patient presents with   Abdominal Pain       Brief Narrative (HPI from H&P)   [name] Michael is a 60 y.o. female with medical history significant of ESRD on hemodialysis, history of CVA, hypertension, type 2 diabetes mellitus, and chronic diastolic CHF who presented to ED from her SNF with complaints of altered mental status and abdominal distention, diagnosed with toxic encephalopathy with possible SBP and admitted to the hospital.   Subjective:   Patient in bed although appears to be in no distress or overall appears extremely frail and weak, she is more awake and alert today but still extremely weak, denies any headache, no chest pain, does say that she has pain all over, no shortness of breath.   Assessment  & Plan :     Acute toxic encephalopathy due to ESBL  E. coli bacteremia suspicious source likely UTI, ruled out SBP - she is on appropriate IV meropenem as she is now growing ESBL E. coli in her blood, source likely UTI, paracentesis not suggestive of SBP, SAAG score under 1 with minimal WBCs.  Overall still extremely guarded.  Continue to monitor.  CT head is nonacute and there are no focal deficits.  Mentation ever so slightly improving daily.  Note her overall health condition appears extremely poor, she is DNR and we will involve palliative care for long-term goals of care.  Sepsis likely present on admission has resolved.  2.  ESRD with abdominal ascites, acute respiratory distress with hypoxia  - she is on TTS schedule, uses as needed oxygen at SNF, likely fluid overload and ascites compromising her respiratory status due to diaphragmatic pressure.  Nephrology team is following had 3 L of fluid removed with HD on 01/26/2021.  Will appears to have third spaced fluid overload, continue fluid removal via HD and added protein  supplements.  3.  Acute on chronic diastolic CHF last EF [id number] on recent echocardiogram.  Fluid removal through HD and ascites fluid removal through paracentesis  4.  Anemia of chronic disease.  Stable.  5.  Chronic thrombocytopenia.  Stable monitor.  6.  Hypertension.  Pressure stable on Coreg and slightly on the higher side, hold further midodrine.  7.  Hypophosphatemia.  Replaced.  8.  Lactic acidosis due to renal failure and ESRD.  Along with likely sepsis upon admission.  Sepsis has resolved.  9.  Hypothyroidism with stable TSH currently on IV Synthroid.  10.  Severe protein calorie malnutrition with massive third spacing of fluid.  Supportive care protein supplements.    11.DM type II.  Sliding scale.  Lab Results  Component Value Date   HGBA1C 5.9 (H) 10/14/2020   CBG (last 3)  Recent Labs    01/28/21 1609 01/28/21 2048 01/29/21 0736  GLUCAP 150* 244* 176*   Lab Results  Component Value Date   TSH 5.240 (H) 01/26/2021         Condition - Extremely Guarded  Family Communication  :   Riley Churches on [id number] on 01/26/2021    Niece Larene Beach [id number] on 01/26/2021 at 12:19 PM message left   Code Status :  DNR  Consults  :  Renal, IR  PUD Prophylaxis : PPI   Procedures  :      US Paracentesis with 1.1 L of fluid removed  CT head.  Nonacute.    CT chest abdomen pelvis.- Suboptimal evaluation, within this constraint; 1. No acute thoracic or abdominopelvic findings. 2. Cardiomegaly. 3. Small volume right pleural effusion with adjacent basilar consolidation, likely atelectasis though early pneumonia can appear similar. 4. Nonobstructing  left nephrolithiasis.  No hydronephrosis. 5. Moderate volume intra-abdominal ascites. 6. Addition and senescent findings, as above. Aortic Atherosclerosis      Disposition Plan  :    Status is: Inpatient  Remains inpatient appropriate because:IV treatments appropriate due to intensity of illness or inability to take PO  Dispo: The patient is from: SNF              Anticipated d/c is to: SNF              Patient currently is not medically stable to d/c.   Difficult to place patient No   DVT Prophylaxis  :  Heparin added  heparin injection 5,000 Units Start: 01/26/21 1400 SCDs Start: 01/25/21 1646   Lab Results  Component Value Date   PLT 86 (L) 01/29/2021    Diet :  Diet Order             DIET DYS 2 Room service appropriate? Yes with Assist; Fluid consistency: Thin; Fluid restriction: 1200 mL Fluid  Diet effective now                    Inpatient Medications  Scheduled Meds:  (feeding supplement) PROSource Plus  30 mL Oral BID BM   aspirin  81 mg Oral Daily   atorvastatin  40 mg Oral QHS   carvedilol  3.125 mg Oral BID WC   Chlorhexidine Gluconate Cloth  6 each Topical Q0600   darbepoetin (ARANESP) injection - DIALYSIS  60 mcg Intravenous Q Sat-HD   dextrose  12.5 g Intravenous Once   doxercalciferol  4 mcg Intravenous Q T,Th,Sa-HD   feeding supplement (NEPRO CARB STEADY)  237 mL Oral TID with meals   feeding supplement (PRO-STAT SUGAR FREE 64)  30 mL Oral q AM   FLUoxetine  20 mg Oral Daily   heparin  2,000 Units Dialysis Once in dialysis   heparin injection (subcutaneous)  5,000 Units Subcutaneous Q8H   insulin aspart  0-6 Units Subcutaneous TID WC   levothyroxine  50 mcg Intravenous Daily   multivitamin  1 tablet Oral QHS   omega-3 acid ethyl esters  2 capsule Oral BID   pantoprazole  80 mg Oral Daily   phosphorus  250 mg Oral Once   sodium bicarbonate  1,300 mg Oral BID   Continuous Infusions:  sodium chloride     sodium chloride     albumin human      meropenem (MERREM) IV 500 mg (01/28/21 2257)   PRN Meds:.sodium chloride, sodium chloride, acetaminophen **OR** [DISCONTINUED] acetaminophen, albumin human, alteplase, cloNIDine, heparin, heparin, hydrALAZINE, lidocaine (PF), lidocaine-prilocaine, pentafluoroprop-tetrafluoroeth  Antibiotics  :    Anti-infectives (From admission, onward)    Start     Dose/Rate Route Frequency Ordered Stop   01/26/21 2200  meropenem (MERREM) 500 mg in sodium chloride 0.9 % 100 mL IVPB        500 mg 200 mL/hr over 30 Minutes Intravenous Every 24 hours 01/25/21 2151     01/26/21 1800  ceFEPIme (MAXIPIME) 2 g in sodium chloride 0.9 % 100 mL IVPB  Status:  Discontinued        2 g 200 mL/hr over 30 Minutes Intravenous Every T-Th-Sa (1800) 01/25/21 1149 01/25/21 2149   01/25/21 2245  meropenem (MERREM) 1 g in sodium chloride 0.9 % 100 mL IVPB        1 g 200 mL/hr over 30 Minutes Intravenous  Once 01/25/21 2149 01/25/21 2334   01/25/21 2000  metroNIDAZOLE (FLAGYL) IVPB 500 mg  Status:  Discontinued       Note to Pharmacy: 8 hours from last dose   500 mg 100 mL/hr over 60 Minutes Intravenous Every 8 hours 01/25/21 1945 01/25/21 2149   01/25/21 1015  ceFEPIme (MAXIPIME) 2 g in sodium chloride 0.9 % 100 mL IVPB        2 g 200 mL/hr over 30 Minutes Intravenous  Once 01/25/21 1005 01/25/21 1242   01/25/21 1015  metroNIDAZOLE (FLAGYL) IVPB 500 mg        500 mg 100 mL/hr over 60 Minutes Intravenous  Once 01/25/21 1005 01/25/21 1212        Time Spent in minutes  30   Lala Lund M.D on 01/29/2021 at 10:55 AM  To page go to [email or web address]   [facility] -  Office  [phone]   See all Orders from today for further details    Objective:   Vitals:   01/28/21 1959 01/28/21 2351 01/29/21 0355 01/29/21 0500  BP: (!) 155/67 (!) 151/80 (!) 155/65   Pulse: 91 89 89   Resp: '16 18 17   '$ Temp: 98.6 F (37 C) 98 F (36.7 C) 97.9 F (36.6 C)   TempSrc: Oral Oral Axillary   SpO2: 96% 96% 98%    Weight:    44.4 kg  Height:        Wt Readings from Last 3 Encounters:  01/29/21 44.4 kg  01/23/21 46.4 kg  01/22/21 46.3 kg     Intake/Output Summary (Last 24 hours) at 01/29/2021 1055 Last data filed at 01/28/2021 2335 Gross per 24 hour  Intake 371.26 ml  Output --  Net 371.26 ml     Physical Exam  Overall less confused, but appears extremely  weak and frail, moves all 4 extremities and follows basic commands, Arnold.AT,PERRAL Supple Neck,No JVD, No cervical lymphadenopathy appriciated.  Symmetrical Chest wall movement, Good air movement bilaterally, CTAB RRR,No Gallops, Rubs or new Murmurs, No Parasternal Heave +ve B.Sounds, Abd Soft, No tenderness, No organomegaly appriciated, No rebound - guarding or rigidity. No Cyanosis, severely third spaced    Data Review:    CBC Recent Labs  Lab 01/25/21 0915 01/25/21 1524 01/26/21 0136 01/27/21 0616 01/28/21 0828 01/29/21 0102 01/29/21 0611  WBC 5.9 8.5 9.8 9.3 9.3 6.0 5.6  HGB 11.0* 9.8* 9.4* 8.9* 9.5* 9.2* 9.1*  HCT 37.3 33.9* 30.9* 29.4* 31.1* 30.0* 30.8*  PLT PLATELET CLUMPS NOTED ON SMEAR, UNABLE TO ESTIMATE 88* 102* 79* 94* 88* 86*  MCV 94.0 96.0 92.8 91.6 90.7 90.9 91.4  MCH 27.7 27.8 28.2 27.7 27.7 27.9 27.0  MCHC 29.5* 28.9* 30.4 30.3 30.5 30.7 29.5*  RDW 21.6* 21.9* 21.4* 21.1* 20.8* 20.7* 20.7*  LYMPHSABS 0.3* 0.3*  --  0.3* 0.3* 0.3*  --   MONOABS 0.1 0.3  --  0.4 0.5 0.6  --   EOSABS 0.0 0.0  --  0.1 0.1 0.1  --   BASOSABS 0.0 0.0  --  0.0 0.0 0.0  --     Recent Labs  Lab 01/25/21 0857 01/25/21 0915 01/25/21 0915 01/25/21 0926 01/25/21 1057 01/25/21 2114 01/26/21 0136 01/26/21 0726 01/27/21 0616 01/28/21 0828 01/29/21 0102 01/29/21 0611  NA  --  138   < >  --   --  139 138  --  137 138 137 136  K  --  2.7*   < >  --   --  3.1* 3.2*  --  3.2* 3.2* 3.1* 3.3*  CL  --  96*   < >  --   --  98 99  --  101 99 98 98  CO2  --  31   < >  --   --  27 28  --  '27 30 30 31  '$ GLUCOSE  --  173*   < >  --    --  105* 97  --  165* 148* 161* 185*  BUN  --  8   < >  --   --  13 13  --  11 21* 28* 30*  CREATININE  --  2.02*   < >  --   --  2.18* 2.28*  --  1.63* 2.59* 3.07* 3.09*  CALCIUM  --  8.7*   < >  --   --  8.9 8.5*  --  8.6* 9.3 9.7 9.7  AST  --  33  --   --   --   --  26  --  '22 19 26  '$ --   ALT  --  17  --   --   --   --  15  --  '15 14 15  '$ --   ALKPHOS  --  91  --   --   --   --  66  --  63 73 79  --   BILITOT  --  1.8*  --   --   --   --  1.5*  --  1.3* 1.4* 1.0  --   ALBUMIN  --  3.1*   < >  --   --   --  2.4*  --  2.1* 2.3* 2.2* 2.1*  MG  --   --   --   --   --  1.7  --   --   --   --   --   --   PROCALCITON  --   --   --   --   --   --   --  7.20 8.35 6.94  --   --   LATICACIDVEN 2.6*  --   --   --  3.0*  --   --   --   --   --   --   --   INR  --  1.5*  --   --   --   --  1.8*  --   --   --   --   --   TSH  --   --   --   --   --   --   --  5.240*  --   --   --   --   AMMONIA  --   --   --  34  --   --   --   --   --   --   --   --    < > = values in this interval not displayed.    ------------------------------------------------------------------------------------------------------------------ No results for input(s): CHOL, HDL, LDLCALC, TRIG, CHOLHDL, LDLDIRECT in the last 72 hours.  Lab Results  Component Value Date   HGBA1C 5.9 (H) 10/14/2020   ------------------------------------------------------------------------------------------------------------------ No results for input(s): TSH, T4TOTAL, T3FREE, THYROIDAB in the last 72 hours.  Invalid input(s): FREET3   Cardiac Enzymes No results for input(s): CKMB, TROPONINI, MYOGLOBIN in the last 168 hours.  Invalid input(s): CK ------------------------------------------------------------------------------------------------------------------    Component Value Date/Time   BNP 1,275.7 (H) 12/03/2019 0329    Radiology Reports  CT Abdomen Pelvis Wo Contrast  Result Date: 01/25/2021 CLINICAL DATA:  Abdominal distension  EXAM: CT CHEST, ABDOMEN AND PELVIS WITHOUT CONTRAST TECHNIQUE: Multidetector CT imaging of the chest, abdomen and pelvis was performed following the standard protocol without IV contrast. COMPARISON:  Pelvis radiograph, 10/13/2020. CT abdomen pelvis, 10/05/2020. FINDINGS: The examination is limited secondary to lack of intravenous contrast, and significant motion artifact-most pronounced at the level the chest. CT CHEST FINDINGS Cardiovascular: Cardiomegaly with 4-chamber cardiac enlargement. Multi vessel coronary vascular calcifications. Mediastinum/Nodes: No enlarged mediastinal, hilar, or axillary lymph nodes. Thyroid gland, trachea, and esophagus demonstrate no significant findings. Lungs/Pleura: Small volume right pleural effusion, with adjacent dependent atelectasis. Musculoskeletal: Healing left fifth anterior rib fracture CT ABDOMEN PELVIS FINDINGS Hepatobiliary: No focal liver abnormality is seen. No gallstones, gallbladder wall thickening, or biliary dilatation. Pancreas: Atrophic.  No pancreatic duct dilation. Spleen: Normal in size without focal abnormality. Adrenals/Urinary Tract: Adrenal glands are unremarkable. Perirenal stranding. Punctate nonobstructing nephroliths within the left renal collecting system. No hydronephrosis. Bladder is unremarkable. Stomach/Bowel: Stomach is within normal limits. Appendix appears normal. No evidence of bowel wall thickening, distention, or inflammatory changes. Vascular/Lymphatic: Aortic atherosclerosis. No aneurysmal dilation. No enlarged abdominal or pelvic lymph nodes. Reproductive: Uterus and bilateral adnexa are unremarkable. Other: Moderate volume of intra-abdominal ascites, as seen along the dependent pelvis, pericolic gutters, perihepatic and perisplenic spaces. Musculoskeletal: Diffuse subcutaneous edema. No acute osseous abnormality. IMPRESSION: Suboptimal evaluation, within this constraint; 1. No acute thoracic or abdominopelvic findings. 2. Cardiomegaly.  3. Small volume right pleural effusion with adjacent basilar consolidation, likely atelectasis though early pneumonia can appear similar. 4. Nonobstructing left nephrolithiasis.  No hydronephrosis. 5. Moderate volume intra-abdominal ascites. 6. Addition and senescent findings, as above. Aortic Atherosclerosis (ICD10-I70.0). Electronically Signed   By: Michaelle Birks MD  On: 01/25/2021 12:11   DG Chest 1 View  Result Date: 01/22/2021 CLINICAL DATA:  Fall hypoxia EXAM: CHEST  1 VIEW COMPARISON:  10/13/2020 FINDINGS: Cardiomegaly with aortic atherosclerosis. No focal opacity or pleural effusion. No pneumothorax. IMPRESSION: No active disease.  Cardiomegaly Electronically Signed   By: Donavan Foil M.D.   On: 01/22/2021 18:57   DG Thoracic Spine 2 View  Result Date: 01/22/2021 CLINICAL DATA:  Fall EXAM: THORACIC SPINE 2 VIEWS COMPARISON:  Thoracic spine radiograph 02/29/2020 FINDINGS: There is no radiographically evident thoracic spine fracture. Mild multilevel degenerative changes. IMPRESSION: No radiographically evident thoracic spine fracture. Mild multilevel degenerative changes. Electronically Signed   By: Maurine Simmering   On: 01/22/2021 18:58   CT Head Wo Contrast  Result Date: 01/25/2021 CLINICAL DATA:  Mental status change, unknown cause EXAM: CT HEAD WITHOUT CONTRAST TECHNIQUE: Contiguous axial images were obtained from the base of the skull through the vertex without intravenous contrast. COMPARISON:  CT October 13, 2020. FINDINGS: Motion limited study.  Within this limitation: Brain: No evidence of acute large vascular territory infarction, hemorrhage, hydrocephalus, extra-axial collection or mass lesion/mass effect. Similar remote lacunar infarcts in the bilateral basal ganglia and thalami. Similar additional patchy white matter hypoattenuation, likely related to chronic microvascular ischemic disease. Vascular: No hyperdense vessel identified. Skull: No acute fracture. Sinuses/Orbits: Visualized sinuses  are clear. Similar exophthalmos without acute orbital finding. Other: No sizable mastoid effusions. IMPRESSION: 1. No evidence of acute intracranial abnormality on this motion study. 2. Similar remote lacunar infarcts in bilateral basal ganglia and thalami and chronic microvascular ischemic disease. Electronically Signed   By: Margaretha Sheffield MD   On: 01/25/2021 14:20   CT Chest Wo Contrast  Result Date: 01/25/2021 CLINICAL DATA:  Abdominal distension EXAM: CT CHEST, ABDOMEN AND PELVIS WITHOUT CONTRAST TECHNIQUE: Multidetector CT imaging of the chest, abdomen and pelvis was performed following the standard protocol without IV contrast. COMPARISON:  Pelvis radiograph, 10/13/2020. CT abdomen pelvis, 10/05/2020. FINDINGS: The examination is limited secondary to lack of intravenous contrast, and significant motion artifact-most pronounced at the level the chest. CT CHEST FINDINGS Cardiovascular: Cardiomegaly with 4-chamber cardiac enlargement. Multi vessel coronary vascular calcifications. Mediastinum/Nodes: No enlarged mediastinal, hilar, or axillary lymph nodes. Thyroid gland, trachea, and esophagus demonstrate no significant findings. Lungs/Pleura: Small volume right pleural effusion, with adjacent dependent atelectasis. Musculoskeletal: Healing left fifth anterior rib fracture CT ABDOMEN PELVIS FINDINGS Hepatobiliary: No focal liver abnormality is seen. No gallstones, gallbladder wall thickening, or biliary dilatation. Pancreas: Atrophic.  No pancreatic duct dilation. Spleen: Normal in size without focal abnormality. Adrenals/Urinary Tract: Adrenal glands are unremarkable. Perirenal stranding. Punctate nonobstructing nephroliths within the left renal collecting system. No hydronephrosis. Bladder is unremarkable. Stomach/Bowel: Stomach is within normal limits. Appendix appears normal. No evidence of bowel wall thickening, distention, or inflammatory changes. Vascular/Lymphatic: Aortic atherosclerosis. No  aneurysmal dilation. No enlarged abdominal or pelvic lymph nodes. Reproductive: Uterus and bilateral adnexa are unremarkable. Other: Moderate volume of intra-abdominal ascites, as seen along the dependent pelvis, pericolic gutters, perihepatic and perisplenic spaces. Musculoskeletal: Diffuse subcutaneous edema. No acute osseous abnormality. IMPRESSION: Suboptimal evaluation, within this constraint; 1. No acute thoracic or abdominopelvic findings. 2. Cardiomegaly. 3. Small volume right pleural effusion with adjacent basilar consolidation, likely atelectasis though early pneumonia can appear similar. 4. Nonobstructing left nephrolithiasis.  No hydronephrosis. 5. Moderate volume intra-abdominal ascites. 6. Addition and senescent findings, as above. Aortic Atherosclerosis (ICD10-I70.0). Electronically Signed   By: Michaelle Birks MD   On: 01/25/2021 12:11   US  Paracentesis  Result Date: 01/26/2021 INDICATION: History of ESRD, ascites seen on previous ultrasound study. Request for therapeutic and diagnostic paracentesis. EXAM: ULTRASOUND GUIDED  PARACENTESIS MEDICATIONS: 10 mL 1% lidocaine COMPLICATIONS: None immediate. PROCEDURE: Informed written consent was obtained from the patient after a discussion of the risks, benefits and alternatives to treatment. A timeout was performed prior to the initiation of the procedure. Initial ultrasound scanning demonstrates a large amount of ascites within the right lower abdominal quadrant. The right lower abdomen was prepped and draped in the usual sterile fashion. 1% lidocaine was used for local anesthesia. Following this, a 19 gauge, 7-cm, Yueh catheter was introduced. An ultrasound image was saved for documentation purposes. The paracentesis was performed. The catheter was removed and a dressing was applied. The patient tolerated the procedure well without immediate post procedural complication. FINDINGS: A total of approximately 1.1 L of hazy blood-tinged fluid was removed.  Samples were sent to the laboratory as requested by the clinical team. IMPRESSION: Successful ultrasound-guided paracentesis yielding 1.1 liters of peritoneal fluid. Read by: Durenda Guthrie, PA-C Electronically Signed   By: Ruthann Cancer MD   On: 01/26/2021 13:52   DG Chest Port 1 View  Result Date: 01/25/2021 CLINICAL DATA:  Questionable sepsis EXAM: PORTABLE CHEST 1 VIEW COMPARISON:  Three days ago FINDINGS: Chronic cardiomegaly. Low volume chest. There is no edema, consolidation, effusion, or pneumothorax. Mild atelectasis or scarring in the left mid chest. Extensive artifact from EKG leads. IMPRESSION: No focal pneumonia. Cardiomegaly. Electronically Signed   By: Monte Fantasia M.D.   On: 01/25/2021 09:24   [name] ASCITES (ABDOMEN LIMITED)  Result Date: 01/25/2021 CLINICAL DATA:  Assess for ascites EXAM: LIMITED ABDOMEN ULTRASOUND FOR ASCITES TECHNIQUE: Limited ultrasound survey for ascites was performed in all four abdominal quadrants. COMPARISON:  CT 01/25/2021 FINDINGS: Targeted sonography of the 4 quadrants is performed. Moderate ascites is visualized with largest pocket in the right lower quadrant. IMPRESSION: Moderate ascites Electronically Signed   By: Donavan Foil M.D.   On: 01/25/2021 17:53

## 2021-01-29 NOTE — Progress Notes (Signed)
  Speech Language Pathology Treatment: Dysphagia  Patient Details Name: [name] Michael MRN: [id number] DOB: 07-03-1960 Today's Date: 01/29/2021 Time: [id number] SLP Time Calculation (min) (ACUTE ONLY): 12 min  Assessment / Plan / Recommendation Clinical Impression  Pt's mentation appears to remain altered, although unclear what her baseline mentation is exactly. She needed Mod faded to Min cues to regulate starting/stopping thin liquid consumption via straw. As a result, her first sip contained a lot of liquid and resulted in a quite prolonged coughing episode, concerning for decreased airway protection. With SLP providing additional assist to better regulate this, she did not have additional coughing. RN also reported coughing when taking certain pills with thin liquids, so suggested that these be given in puree instead. She does have oral holding even with pureed solids, but presentation of a dry spoon provided visual and/or tactile input for her to then initiate swallowing. Recommend continuing current diet (Dys 2 solids, thin liquids) with supervision during meals.    HPI HPI: [name] Michael is a 60 y.o. female who presented to ED from her SNF with complaints of AMS and abdominal distention, diagnosed with toxic encephalopathy with possible SBP and admitted to the hospital. Pt s/p paracentesis to R lower abdomen. PMH includes: ESRD on hemodialysis, history of CVA, hypertension, type 2 diabetes mellitus, and chronic diastolic CHF      SLP Plan  Continue with current plan of care       Recommendations  Diet recommendations: Dysphagia 2 (fine chop);Thin liquid Liquids provided via: Cup;Straw Medication Administration: Whole meds with puree Supervision: Staff to assist with self feeding;Full supervision/cueing for compensatory strategies Compensations: Slow rate;Small sips/bites Postural Changes and/or Swallow Maneuvers: Seated upright 90 degrees                Oral Care  Recommendations: Oral care BID Follow up Recommendations: Skilled Nursing facility SLP Visit Diagnosis: Dysphagia, oral phase (R13.11);Dysphagia, unspecified (R13.10) Plan: Continue with current plan of care       GO                Osie Bond., M.A. [facility] Pager [phone] Office [phone]  01/29/2021, 12:57 PM

## 2021-01-29 NOTE — Care Management Important Message (Signed)
Important Message  Patient Details  Name: [name] Michael MRN: [id number] Date of Birth: [birth year]   Medicare Important Message Given:  Yes Patient has a contact precaution in place  IM will be mailed to the patient home address 01/28/21     Orbie Pyo 01/29/2021, 11:18 AM

## 2021-01-29 NOTE — Plan of Care (Signed)
  Problem: Clinical Measurements: Goal: Respiratory complications will improve Outcome: Progressing   Problem: Clinical Measurements: Goal: Cardiovascular complication will be avoided Outcome: Progressing   Problem: Clinical Measurements: Goal: Ability to maintain clinical measurements within normal limits will improve Outcome: Progressing   Problem: Activity: Goal: Risk for activity intolerance will decrease Outcome: Progressing   Problem: Nutrition: Goal: Adequate nutrition will be maintained Outcome: Progressing   Problem: Coping: Goal: Level of anxiety will decrease Outcome: Progressing   Problem: Pain Managment: Goal: General experience of comfort will improve Outcome: Progressing   Problem: Safety: Goal: Ability to remain free from injury will improve Outcome: Progressing   Problem: Skin Integrity: Goal: Risk for impaired skin integrity will decrease Outcome: Progressing

## 2021-01-29 NOTE — Progress Notes (Signed)
[facility] Progress Note   Subjective: Seen in room prior to transport to HD. No issues reported overnight.   Objective Vitals:   01/28/21 1959 01/28/21 2351 01/29/21 0355 01/29/21 0500  BP: (!) 155/67 (!) 151/80 (!) 155/65   Pulse: 91 89 89   Resp: '16 18 17   '$ Temp: 98.6 F (37 C) 98 F (36.7 C) 97.9 F (36.6 C)   TempSrc: Oral Oral Axillary   SpO2: 96% 96% 98%   Weight:    44.4 kg  Height:       Physical Exam General: Chronically ill appearing female in NAD Heart: S1,S2 No M/R/G Lungs: CTAB Abdomen: Ascites present. Active NS, NT Extremities: No LE edema.  Dialysis Access: L AVF + T/B    Additional Objective Labs: Basic Metabolic Panel: Recent Labs  Lab 01/25/21 2114 01/26/21 0136 01/28/21 0828 01/29/21 0102 01/29/21 0611  NA 139   < > 138 137 136  K 3.1*   < > 3.2* 3.1* 3.3*  CL 98   < > 99 98 98  CO2 27   < > '30 30 31  '$ GLUCOSE 105*   < > 148* 161* 185*  BUN 13   < > 21* 28* 30*  CREATININE 2.18*   < > 2.59* 3.07* 3.09*  CALCIUM 8.9   < > 9.3 9.7 9.7  PHOS 2.3*  --   --   --  1.4*   < > = values in this interval not displayed.   Liver Function Tests: Recent Labs  Lab 01/27/21 0616 01/28/21 0828 01/29/21 0102 01/29/21 0611  AST '22 19 26  '$ --   ALT '15 14 15  '$ --   ALKPHOS 63 73 79  --   BILITOT 1.3* 1.4* 1.0  --   PROT 5.0* 5.5* 5.4*  --   ALBUMIN 2.1* 2.3* 2.2* 2.1*   No results for input(s): LIPASE, AMYLASE in the last 168 hours. CBC: Recent Labs  Lab 01/26/21 0136 01/27/21 0616 01/28/21 0828 01/29/21 0102 01/29/21 0611  WBC 9.8 9.3 9.3 6.0 5.6  NEUTROABS  --  8.4* 8.4* 5.0  --   HGB 9.4* 8.9* 9.5* 9.2* 9.1*  HCT 30.9* 29.4* 31.1* 30.0* 30.8*  MCV 92.8 91.6 90.7 90.9 91.4  PLT 102* 79* 94* 88* 86*   Blood Culture    Component Value Date/Time   SDES PERITONEAL FLUID 01/26/2021 1354   SPECREQUEST NONE 01/26/2021 1354   CULT  01/26/2021 1354    NO GROWTH 2 DAYS NO ANAEROBES ISOLATED; CULTURE IN PROGRESS FOR 5  DAYS Performed at [facility], [place] [address], [place], [place] [address]    REPTSTATUS PENDING 01/26/2021 1354    Cardiac Enzymes: No results for input(s): CKTOTAL, CKMB, CKMBINDEX, TROPONINI in the last 168 hours. CBG: Recent Labs  Lab 01/28/21 0746 01/28/21 1151 01/28/21 1609 01/28/21 2048 01/29/21 0736  GLUCAP 140* 176* 150* 244* 176*   Iron Studies: No results for input(s): IRON, TIBC, TRANSFERRIN, FERRITIN in the last 72 hours. '@lablastinr3'$ @ Studies/Results: No results found. Medications:  sodium chloride     sodium chloride     albumin human     meropenem (MERREM) IV 500 mg (01/28/21 2257)    (feeding supplement) PROSource Plus  30 mL Oral BID BM   aspirin  81 mg Oral Daily   atorvastatin  40 mg Oral QHS   carvedilol  3.125 mg Oral BID WC   Chlorhexidine Gluconate Cloth  6 each Topical Q0600   darbepoetin (ARANESP)  injection - DIALYSIS  60 mcg Intravenous Q Sat-HD   dextrose  12.5 g Intravenous Once   doxercalciferol  4 mcg Intravenous Q T,Th,Sa-HD   feeding supplement (NEPRO CARB STEADY)  237 mL Oral TID with meals   feeding supplement (PRO-STAT SUGAR FREE 64)  30 mL Oral q AM   FLUoxetine  20 mg Oral Daily   heparin  2,000 Units Dialysis Once in dialysis   heparin injection (subcutaneous)  5,000 Units Subcutaneous Q8H   insulin aspart  0-6 Units Subcutaneous TID WC   levothyroxine  50 mcg Intravenous Daily   midodrine  5 mg Oral TID WC   multivitamin  1 tablet Oral QHS   omega-3 acid ethyl esters  2 capsule Oral BID   pantoprazole  80 mg Oral Daily   phosphorus  250 mg Oral Once   sodium bicarbonate  1,300 mg Oral BID     HD orders: [place] T,Th,S 3:45 hr 350/500 39.5 kg 2.0 K/ 2.0 Ca AVF -Heparin 2000 units IV TIW -Hectorol 5 mcg IV TIW -Mircera 50 mcg IV q 4 weeks (last dose 01/11/2021)     Assessment/Plan: 1. Acute encephalopathy D/T E Coli bacteremia-On meropenem per primary. Per primary 2. Ascites-S/P paracentesis 08/06 1.1 liters. No  evidence of SBP. Per primary 3. Acute on Chronic HFpEF-EF 55%. Ascites present, no peripheral edema. Continue lowing volume as tolerated. 4. ESRD -T,Th,S next HD 01/29/2021 5. Anemia - HGB 9.5 Aranesp 60 mcg IV 01/26/21. 6. Secondary hyperparathyroidism - Low PO4, not on binders. C Ca High, Decrease VDRA. Recheck RFP in HD tomorrow. 7. HTN/volume -Had not been getting to EDW prior to adm. Net UF 08/06 4 liters Post wt 42 kg. Will continue lowering volume as tolerated. Continue Midodrine, hold carvedilol prior to HD. 8. Nutrition -Low albumin. DYS 2 diet-add fluid restrictions, protein supps. 9. DM per primary  Mansour Balboa H. Kruze Atchley NP-C 01/29/2021, 10:49 AM  [employer] [phone]

## 2021-01-30 DIAGNOSIS — I5032 Chronic diastolic (congestive) heart failure: Secondary | ICD-10-CM

## 2021-01-30 LAB — CBC WITH DIFFERENTIAL/PLATELET
Abs Immature Granulocytes: 0.09 10*3/uL — ABNORMAL HIGH (ref 0.00–0.07)
Basophils Absolute: 0 10*3/uL (ref 0.0–0.1)
Basophils Relative: 0 %
Eosinophils Absolute: 0.1 10*3/uL (ref 0.0–0.5)
Eosinophils Relative: 1 %
HCT: 30.7 % — ABNORMAL LOW (ref 36.0–46.0)
Hemoglobin: 9.4 g/dL — ABNORMAL LOW (ref 12.0–15.0)
Immature Granulocytes: 2 %
Lymphocytes Relative: 11 %
Lymphs Abs: 0.6 10*3/uL — ABNORMAL LOW (ref 0.7–4.0)
MCH: 27.5 pg (ref 26.0–34.0)
MCHC: 30.6 g/dL (ref 30.0–36.0)
MCV: 89.8 fL (ref 80.0–100.0)
Monocytes Absolute: 0.7 10*3/uL (ref 0.1–1.0)
Monocytes Relative: 12 %
Neutro Abs: 4 10*3/uL (ref 1.7–7.7)
Neutrophils Relative %: 74 %
Platelets: 96 10*3/uL — ABNORMAL LOW (ref 150–400)
RBC: 3.42 MIL/uL — ABNORMAL LOW (ref 3.87–5.11)
RDW: 20.3 % — ABNORMAL HIGH (ref 11.5–15.5)
WBC: 5.4 10*3/uL (ref 4.0–10.5)
nRBC: 0 % (ref 0.0–0.2)

## 2021-01-30 LAB — GLUCOSE, CAPILLARY
Glucose-Capillary: 107 mg/dL — ABNORMAL HIGH (ref 70–99)
Glucose-Capillary: 152 mg/dL — ABNORMAL HIGH (ref 70–99)
Glucose-Capillary: 237 mg/dL — ABNORMAL HIGH (ref 70–99)
Glucose-Capillary: 393 mg/dL — ABNORMAL HIGH (ref 70–99)
Glucose-Capillary: 329 mg/dL — ABNORMAL HIGH (ref 70–99)

## 2021-01-30 LAB — COMPREHENSIVE METABOLIC PANEL
ALT: 15 U/L (ref 0–44)
AST: 25 U/L (ref 15–41)
Albumin: 2.2 g/dL — ABNORMAL LOW (ref 3.5–5.0)
Alkaline Phosphatase: 79 U/L (ref 38–126)
Anion gap: 6 (ref 5–15)
BUN: 12 mg/dL (ref 6–20)
CO2: 32 mmol/L (ref 22–32)
Calcium: 8.4 mg/dL — ABNORMAL LOW (ref 8.9–10.3)
Chloride: 97 mmol/L — ABNORMAL LOW (ref 98–111)
Creatinine, Ser: 1.77 mg/dL — ABNORMAL HIGH (ref 0.44–1.00)
GFR, Estimated: 33 mL/min — ABNORMAL LOW (ref >60)
Glucose, Bld: 116 mg/dL — ABNORMAL HIGH (ref 70–99)
Potassium: 3.9 mmol/L (ref 3.5–5.1)
Sodium: 135 mmol/L (ref 135–145)
Total Bilirubin: 1.2 mg/dL (ref 0.3–1.2)
Total Protein: 5.5 g/dL — ABNORMAL LOW (ref 6.5–8.1)

## 2021-01-30 LAB — PROCALCITONIN: Procalcitonin: 4.15 ng/mL

## 2021-01-30 MED ORDER — INSULIN ASPART 100 UNIT/ML IJ SOLN
0.0000 [IU] | Freq: Three times a day (TID) | INTRAMUSCULAR | Status: DC
  Administered 2021-01-31 (×2): 1 [IU] via SUBCUTANEOUS

## 2021-01-30 MED ORDER — INSULIN ASPART 100 UNIT/ML IJ SOLN
0.0000 [IU] | Freq: Every day | INTRAMUSCULAR | Status: DC
Start: 2021-01-30 — End: 2021-02-02
  Administered 2021-01-30: 4 [IU] via SUBCUTANEOUS

## 2021-01-30 MED ORDER — GERHARDT'S BUTT CREAM
TOPICAL_CREAM | Freq: Three times a day (TID) | CUTANEOUS | Status: DC
  Administered 2021-01-30 – 2021-01-31 (×3): 1 via TOPICAL
  Filled 2021-01-30: qty 1

## 2021-01-30 MED ORDER — SACCHAROMYCES BOULARDII 250 MG PO CAPS
250.0000 mg | ORAL_CAPSULE | Freq: Two times a day (BID) | ORAL | Status: DC
  Administered 2021-01-30 – 2021-02-01 (×6): 250 mg via ORAL
  Filled 2021-01-30 (×6): qty 1

## 2021-01-30 MED ORDER — LEVOTHYROXINE SODIUM 75 MCG PO TABS
75.0000 ug | ORAL_TABLET | Freq: Every day | ORAL | Status: DC
  Administered 2021-01-31 – 2021-02-01 (×2): 75 ug via ORAL
  Filled 2021-01-30 (×2): qty 1

## 2021-01-30 NOTE — Progress Notes (Signed)
Daily Progress Note   Patient Name: [name] Michael       Date: 01/30/2021 DOB: February 24, 1961  Age: 60 y.o. MRN#: [id number] Attending Physician: Elmarie Shiley, MD Primary Care Physician: Charlott Rakes, MD Admit Date: 01/25/2021  Reason for Consultation/Follow-up: goals of care "ESRD, sepsis, multiple admits, appears to have extremely poor baseline"  Subjective: Patient is OOB to the recliner, eating dinner. She is alert and oriented to person, place, situation. I ask her how things are going at the SNF and with dialysis and she states "ok". I ask what brings her life meaning and she states "Jesus". She tells me she is "ready to go home". I ask what she means by home and she states "heaven". She tells me she "hurts all over", especially from the wound on her bottom.   Discussion was had at length about comfort care - provided counseling that the focus is comfort and dignity rather than prolonging life. Discussed that full scope medical interventions would be stopped, allowing a natural course to occur. Discussed that this would mean stopping dialysis, which is a life-prolonging intervention. Discussed that medications could be given to relieve her pain and allow her to pass peacefully. [name] Michael verbalizes understanding, but does not seem to be ready to stop dialysis at this time.   I spoke with her sister [name] by phone and conveyed the conversation I just had with [name] Michael. Discussed that [name]'s mental status has improved and she can now participate in decision making.  [name] states she and her brother were visiting earlier and [name] Michael also expressed to them she was "ready to go", but [name] thinks she just wants "the Lord to take her" without having to make the decision to stop dialysis.   Length of  Stay: 5  Current Medications: Scheduled Meds:   (feeding supplement) PROSource Plus  30 mL Oral BID BM   aspirin  81 mg Oral Daily   atorvastatin  40 mg Oral QHS   carvedilol  3.125 mg Oral BID WC   Chlorhexidine Gluconate Cloth  6 each Topical Q0600   darbepoetin (ARANESP) injection - DIALYSIS  60 mcg Intravenous Q Sat-HD   dextrose  12.5 g Intravenous Once   doxercalciferol  4 mcg Intravenous Q T,Th,Sa-HD   feeding supplement (NEPRO CARB STEADY)  237 mL Oral TID with  meals   feeding supplement (PRO-STAT SUGAR FREE 64)  30 mL Oral q AM   FLUoxetine  20 mg Oral Daily   Gerhardt's butt cream   Topical TID   heparin injection (subcutaneous)  5,000 Units Subcutaneous Q8H   insulin aspart  0-6 Units Subcutaneous TID WC   [START ON 01/31/2021] levothyroxine  75 mcg Oral Q0600   multivitamin  1 tablet Oral QHS   omega-3 acid ethyl esters  2 capsule Oral BID   pantoprazole  80 mg Oral Daily   phosphorus  250 mg Oral Once   saccharomyces boulardii  250 mg Oral BID   sodium bicarbonate  1,300 mg Oral BID    Continuous Infusions:  albumin human     meropenem (MERREM) IV 500 mg (01/29/21 2245)    PRN Meds: acetaminophen **OR** [DISCONTINUED] acetaminophen, albumin human, cloNIDine, hydrALAZINE  Physical Exam Constitutional:      General: She is not in acute distress.    Appearance: She is ill-appearing.  Pulmonary:     Effort: Pulmonary effort is normal.  Neurological:     Mental Status: She is alert and oriented to person, place, and time.     Motor: Weakness present.            Vital Signs: BP (!) 157/70 (BP Location: Right Arm)   Pulse 87   Temp 98.5 F (36.9 C) (Oral)   Resp 18   Ht '4\' 6"'$  (1.372 m)   Wt 40 kg   LMP 03/06/2013 (LMP Unknown)   SpO2 100%   BMI 21.26 kg/m  SpO2: SpO2: 100 % O2 Device: O2 Device: Room Air O2 Flow Rate: O2 Flow Rate (L/min): 2 L/min  Intake/output summary:  Intake/Output Summary (Last 24 hours) at 01/30/2021 1948 Last data filed at  01/30/2021 1800 Gross per 24 hour  Intake 720 ml  Output 0 ml  Net 720 ml   LBM: Last BM Date: 01/30/21 Baseline Weight: Weight: 46.4 kg Most recent weight: Weight: 40 kg       Palliative Assessment/Data: PPS 30-40%       Palliative Care Assessment & Plan   HPI/Patient Profile: 61 y.o. female  with past medical history of ESRD on HD, chronic diastolic heart failure, history of CVA, type 2 diabetes, HTN, and HLD.  She presented to the emergency department from her SNF on 01/25/2021 with altered mental status and abdominal distention. In the ED, chest/abdomen CT with no acute thoracic or abdo-pelvic findings. Small volume right pleural effusion with adjacent basilar consolidation, likely atelectasis vs. early pneumonia, moderate volume intra-abdominal ascites. Head CT negative for acute findings. Labs significant for hypokalemia and elevated lactic acid. On PRN oxygen at SNF, but requiring 4L in the ED. Admitted to [facility] with acute metabolic encephalopathy, abdominal distention, and acute respiratory failure with hypoxia.   Assessment: - acute metabolic encephalopathy - ESBL E.coli bacteremia, likely source UTI - chronic diastolic CHF - ESRD on HD - acute hypoxic respiratory failure  - abdominal ascites  Recommendations/Plan: DNR/DNI  Continue current medical interventions Patient is not yet ready to stop dialysis Goal of care is return to SNF Spiritual care consult  Goals of Care and Additional Recommendations: Limitations on Scope of Treatment: Full Scope Treatment  Code Status:DNR/DNI   Prognosis:  Unable to determine  Discharge Planning: Likely return to SNF   Thank you for allowing the Palliative Medicine Team to assist in the care of this patient.   Total Time 35 minutes Prolonged Time Billed  no  Greater than 50%  of this time was spent counseling and coordinating care related to the above assessment and plan.  Lavena Bullion, NP  Please contact  Palliative Medicine Team phone at [phone] for questions and concerns.

## 2021-01-30 NOTE — Progress Notes (Signed)
Physical Therapy Treatment Patient Details Name: [name] Michael MRN: [id number] DOB: 1961/04/09 Today's Date: 01/30/2021    History of Present Illness 60yo female admitted 01/25/21 with AMS and stomach distension. Found to have acute metabolic encephalopathy, abdominal ascites, and acute respiratory failure with hypoxia. PMH ESRD on HD, HLD, HTN, CVA, syncope, TIA, tobacco abuse, DM, O2 use PRN    PT Comments    Patient received in bed, she is agreeable to get up to recliner. Reports she feels awful. Found to be soiled in bed, patient unaware. Cleaned up, requires min assist for rolling in bed, max assist for supine to sit. Min guard for sitting balance. Mod +2 assist to stand and take a few steps to recliner. Patient is slow to respond and requires max verbal cues. She will continue to benefit from skilled PT while here to improve strength and functional independence.       Follow Up Recommendations  SNF;Supervision/Assistance - 24 hour     Equipment Recommendations  Other (comment) (to be determined at next venue)    Recommendations for Other Services       Precautions / Restrictions Precautions Precautions: Fall Precaution Comments: watch sats Restrictions Weight Bearing Restrictions: No    Mobility  Bed Mobility Overal bed mobility: Needs Assistance Bed Mobility: Rolling;Supine to Sit Rolling: Min assist   Supine to sit: Max assist;HOB elevated     General bed mobility comments: requires assist to bring legs off bed and max assist to raise trunk to seated position. Min guard to supervision with sitting    Transfers Overall transfer level: Needs assistance Equipment used: Rolling walker (2 wheeled) Transfers: Sit to/from Stand Sit to Stand: Mod assist;+2 physical assistance            Ambulation/Gait   Gait Distance (Feet): 3 Feet Assistive device: Rolling walker (2 wheeled) Gait Pattern/deviations: Step-to pattern;Decreased step length - right;Decreased step  length - left Gait velocity: very decreased   General Gait Details: patient able to take a few small steps from bed to recliner. Needs contant verbal cues to keep feet moving and assistance to move RW.   Stairs             Wheelchair Mobility    Modified Rankin (Stroke Patients Only)       Balance Overall balance assessment: Needs assistance Sitting-balance support: Feet unsupported;Bilateral upper extremity supported Sitting balance-Leahy Scale: Fair Sitting balance - Comments: posterior bias- close min guard to MinA for balance Postural control: Posterior lean Standing balance support: Bilateral upper extremity supported;During functional activity Standing balance-Leahy Scale: Poor Standing balance comment: ModAx2 and RW for balance                            Cognition Arousal/Alertness: Awake/alert;Lethargic Behavior During Therapy: Flat affect Overall Cognitive Status: No family/caregiver present to determine baseline cognitive functioning Area of Impairment: Problem solving                         Safety/Judgement: Decreased awareness of safety;Decreased awareness of deficits Awareness: Intellectual Problem Solving: Slow processing;Decreased initiation;Difficulty sequencing;Requires verbal cues General Comments: Patient is slow to respond to commands and questions.      Exercises      General Comments        Pertinent Vitals/Pain Pain Assessment: Faces Faces Pain Scale: Hurts a little bit Pain Location: bottom when being cleaned Pain Descriptors / Indicators: Discomfort;Sore;Grimacing Pain Intervention(s): Monitored  during session;Repositioned    Home Living                      Prior Function            PT Goals (current goals can now be found in the care plan section) Acute Rehab PT Goals Patient Stated Goal: to get up to recliner PT Goal Formulation: With patient Time For Goal Achievement: 02/10/21 Potential  to Achieve Goals: Good Progress towards PT goals: Progressing toward goals    Frequency    Min 2X/week      PT Plan Current plan remains appropriate    Co-evaluation PT/OT/SLP Co-Evaluation/Treatment: Yes Reason for Co-Treatment: Complexity of the patient's impairments (multi-system involvement);For patient/therapist safety;To address functional/ADL transfers PT goals addressed during session: Mobility/safety with mobility;Balance        AM-PAC PT "6 Clicks" Mobility   Outcome Measure  Help needed turning from your back to your side while in a flat bed without using bedrails?: A Little Help needed moving from lying on your back to sitting on the side of a flat bed without using bedrails?: A Lot Help needed moving to and from a bed to a chair (including a wheelchair)?: A Lot Help needed standing up from a chair using your arms (e.g., wheelchair or bedside chair)?: A Lot Help needed to walk in hospital room?: A Lot Help needed climbing 3-5 steps with a railing? : Total 6 Click Score: 12    End of Session Equipment Utilized During Treatment: Gait belt;Oxygen Activity Tolerance: Patient limited by fatigue;Patient limited by lethargy Patient left: in chair;with call bell/phone within reach;Other (comment);with nursing/sitter in room (patient left with OT in room.) Nurse Communication: Mobility status PT Visit Diagnosis: Unsteadiness on feet (R26.81);Muscle weakness (generalized) (M62.81);Difficulty in walking, not elsewhere classified (R26.2);Other abnormalities of gait and mobility (R26.89)     Time: 1031-1050 PT Time Calculation (min) (ACUTE ONLY): 19 min  Charges:  $Therapeutic Activity: 8-22 mins                     Orvill Coulthard, PT, GCS 01/30/21,11:13 AM

## 2021-01-30 NOTE — Progress Notes (Addendum)
PROGRESS NOTE    [name] Michael  [id number] DOB: 1961-05-17 DOA: 01/25/2021 PCP: Charlott Rakes, MD   Brief Narrative: 60 year old old with past medical history significant for ESRD on hemodialysis, history of CVA, hypertension, diabetes type 2, chronic diastolic heart failure who presents to the ED from a skilled nursing facility complaining of altered mental status and abdominal distention, diagnosed with toxic encephalopathy with possible SBP and admitted to the hospital.  Subsequently SBP was ruled out.    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 coli bacteremia suspicious for source likely UTI SBO ruled out. Continue with meropenem. Day 5. -Blood culture (01/25/2021) grew E. coli ESBL. Paracentesis not suggestive of SBP. CT head no acute findings. Sepsis likely present on admission has resolved. Due to her overall health condition, palliative care was consulted for long term goals of care.    2-ESRD with ascites, Acute hypoxic respiratory failure: fluid overload, ascites.  Dialysis TTS schedule. Use oxygen as needed at a skilled nursing facility. Nephrology assisting with hemodialysis.  3-Anemia of chronic disease Hb stable.   Chronic thrombocytopenia Stable.   Hypertension: Continue with Coreg  Hypophosphatemia: Replaced  Lactic  acidosis due to renal failure and ESRD, also sepsis on admission.  Severe protein caloric malnutrition with massive third spacing of fluid On protein  supplements  Diabetes type 2: Continue with sliding scale insulin GI; loose stool. Start florastore. Hypothyroidism: Change synthroid to oral.  Goals of care; Palliative to discussed with family further goals of care.   Estimated body mass index is 21.26 kg/m as calculated from the following:   Height as of this encounter: '4\' 6"'$  (1.372 m).   Weight as of this encounter: 40 kg.   DVT prophylaxis: Heparin Code Status: DNR Family Communication: No family at bedside.  Disposition Plan:  Status is: Inpatient  Remains inpatient appropriate because:IV treatments appropriate due to intensity of illness or inability to take PO  Dispo: The patient is from: SNF              Anticipated d/c is to: SNF              Patient currently is not medically stable to d/c.   Difficult to place patient No        Consultants:  Nephrology Palliative care  Procedures:  Ultrasound paracentesis with 1.1 L of fluid removed   Antimicrobials:    Subjective: She is alert, report multiples BM, denies abdominal pain   Objective: Vitals:   01/30/21 0344 01/30/21 0422 01/30/21 0731 01/30/21 1147  BP: (!) 143/75  132/74 (!) 155/61  Pulse: 78  78 88  Resp: '19  17 17  '$ Temp: 98 F (36.7 C)  97.6 F (36.4 C) 98.2 F (36.8 C)  TempSrc: Oral  Oral Oral  SpO2: 100%  94% 100%  Weight: 40.6 kg 40 kg    Height:        Intake/Output Summary (Last 24 hours) at 01/30/2021 1535 Last data filed at 01/30/2021 0424 Gross per 24  hour  Intake 240 ml  Output 3100 ml  Net -2860 ml   Filed Weights   01/29/21 1704 01/30/21 0344 01/30/21 0422  Weight: 40.1 kg 40.6 kg 40 kg    Examination:  General exam: Appears calm and comfortable  Respiratory system: Clear to auscultation. Respiratory effort normal. Cardiovascular system: S1 & S2 heard, RRR. No JVD, murmurs, rubs, gallops or clicks. No pedal edema. Gastrointestinal system: Abdomen is nondistended, soft and nontender. No organomegaly or masses felt.  Normal bowel sounds heard. Central nervous system: Alert and oriented.  Extremities: Symmetric 5 x 5 power.   Data Reviewed: I have personally reviewed following labs and imaging studies  CBC: Recent Labs  Lab 01/25/21 1524 01/26/21 0136 01/27/21 0616 01/28/21 0828 01/29/21 0102 01/29/21 0611 01/30/21 0215  WBC 8.5   < > 9.3 9.3 6.0 5.6 5.4  NEUTROABS 7.9*  --  8.4* 8.4* 5.0  --  4.0  HGB 9.8*   < > 8.9* 9.5* 9.2* 9.1* 9.4*  HCT 33.9*   < > 29.4* 31.1* 30.0* 30.8* 30.7*  MCV 96.0   < > 91.6 90.7 90.9 91.4 89.8  PLT 88*   < > 79* 94* 88* 86* 96*   < > = values in this interval not displayed.   Basic Metabolic Panel: Recent Labs  Lab 01/25/21 2114 01/26/21 0136 01/27/21 [id number] 01/28/21 0828 01/29/21 0102 01/29/21 0611 01/30/21 0215  NA 139   < > 137 138 137 136 135  K 3.1*   < > 3.2* 3.2* 3.1* 3.3* 3.9  CL 98   < > 101 99 98 98 97*  CO2 27   < > '27 30 30 31 '$ 32  GLUCOSE 105*   < > 165* 148* 161* 185* 116*  BUN 13   < > 11 21* 28* 30* 12  CREATININE 2.18*   < > 1.63* 2.59* 3.07* 3.09* 1.77*  CALCIUM 8.9   < > 8.6* 9.3 9.7 9.7 8.4*  MG 1.7  --   --   --   --   --   --   PHOS 2.3*  --   --   --   --  1.4*  --    < > = values in this interval not displayed.   GFR: Estimated Creatinine Clearance: 18.7 mL/min (A) (by C-G formula based on SCr of 1.77 mg/dL (H)). Liver Function Tests: Recent Labs  Lab 01/26/21 0136 01/27/21 0616 01/28/21 0828 01/29/21 0102 01/29/21 0611 01/30/21 0215  AST '26 22 19 26  '$ --  25  ALT '15 15 14 15  '$ --  15  ALKPHOS 66 63 73 79  --  79  BILITOT 1.5* 1.3* 1.4* 1.0  --  1.2  PROT 5.4* 5.0* 5.5* 5.4*  --  5.5*  ALBUMIN 2.4* 2.1* 2.3* 2.2* 2.1* 2.2*   No results for input(s): LIPASE, AMYLASE in the last 168 hours. Recent Labs  Lab 01/25/21 0926  AMMONIA 34   Coagulation Profile: Recent Labs  Lab 01/25/21 0915 01/26/21 0136  INR 1.5* 1.8*   Cardiac Enzymes: No results for input(s): CKTOTAL, CKMB, CKMBINDEX, TROPONINI in the last  168 hours. BNP (last 3 results) No results for input(s): PROBNP in the last 8760 hours. HbA1C: No results for input(s): HGBA1C in the last 72 hours. CBG: Recent Labs  Lab 01/29/21 1149 01/29/21 1710 01/29/21 2048 01/30/21 0734 01/30/21 1150  GLUCAP 167* 94 123* 107* 152*   Lipid Profile: No results for input(s): CHOL, HDL, LDLCALC, TRIG, CHOLHDL,  LDLDIRECT in the last 72 hours. Thyroid Function Tests: No results for input(s): TSH, T4TOTAL, FREET4, T3FREE, THYROIDAB in the last 72 hours. Anemia Panel: No results for input(s): VITAMINB12, FOLATE, FERRITIN, TIBC, IRON, RETICCTPCT in the last 72 hours. Sepsis Labs: Recent Labs  Lab 01/25/21 0857 01/25/21 1057 01/26/21 0726 01/27/21 0616 01/28/21 0828 01/29/21 0611 01/30/21 0215  PROCALCITON  --   --    < > 8.35 6.94 5.04 4.15  LATICACIDVEN 2.6* 3.0*  --   --   --   --   --    < > = values in this interval not displayed.    Recent Results (from the past 240 hour(s))  Blood culture (routine single)     Status: Abnormal   Collection Time: 01/25/21  9:19 AM   Specimen: BLOOD  Result Value Ref Range Status   Specimen Description BLOOD RIGHT ANTECUBITAL  Final   Special Requests   Final    BOTTLES DRAWN AEROBIC AND ANAEROBIC Blood Culture results may not be optimal due to an inadequate volume of blood received in culture bottles   Culture  Setup Time   Final    GRAM NEGATIVE RODS IN BOTH AEROBIC AND ANAEROBIC BOTTLES CRITICAL RESULT CALLED TO, READ BACK BY AND VERIFIED WITH: Cottie Banda [id number] 2141 FCP Performed at [facility], [place] [address], [place], [place] [address]    Culture (A)  Final    ESCHERICHIA COLI Confirmed Extended Spectrum Beta-Lactamase Producer (ESBL).  In bloodstream infections from ESBL organisms, carbapenems are preferred over piperacillin/tazobactam. They are shown to have a lower risk of mortality.    Report Status 01/27/2021 FINAL  Final   Organism ID, Bacteria ESCHERICHIA COLI  Final       Susceptibility   Escherichia coli - MIC*    AMPICILLIN >=32 RESISTANT Resistant     CEFAZOLIN >=64 RESISTANT Resistant     CEFEPIME 2 SENSITIVE Sensitive     CEFTAZIDIME RESISTANT Resistant     CEFTRIAXONE >=64 RESISTANT Resistant     CIPROFLOXACIN 0.5 SENSITIVE Sensitive     GENTAMICIN >=16 RESISTANT Resistant     IMIPENEM <=0.25 SENSITIVE Sensitive     TRIMETH/SULFA >=320 RESISTANT Resistant     AMPICILLIN/SULBACTAM >=32 RESISTANT Resistant     PIP/TAZO <=4 SENSITIVE Sensitive     * ESCHERICHIA COLI  Blood Culture ID Panel (Reflexed)     Status: Abnormal   Collection Time: 01/25/21  9:19 AM  Result Value Ref Range Status   Enterococcus faecalis NOT DETECTED NOT DETECTED Final   Enterococcus Faecium NOT DETECTED NOT DETECTED Final   Listeria monocytogenes NOT DETECTED NOT DETECTED Final   Staphylococcus species NOT DETECTED NOT DETECTED Final   Staphylococcus aureus (BCID) NOT DETECTED NOT DETECTED Final   Staphylococcus epidermidis NOT DETECTED NOT DETECTED Final   Staphylococcus lugdunensis NOT DETECTED NOT DETECTED Final   Streptococcus species NOT DETECTED NOT DETECTED Final   Streptococcus agalactiae NOT DETECTED NOT DETECTED Final   Streptococcus pneumoniae NOT DETECTED NOT DETECTED Final   Streptococcus pyogenes NOT DETECTED NOT DETECTED Final   A.calcoaceticus-baumannii NOT DETECTED NOT DETECTED Final   Bacteroides fragilis NOT DETECTED NOT DETECTED Final   Enterobacterales DETECTED (A) NOT DETECTED Final    Comment: Enterobacterales represent a large order of gram negative bacteria, not a single organism. CRITICAL RESULT CALLED TO, READ BACK BY AND VERIFIED WITH: PHARMD L SEAY [id number] 2141 FCP    Enterobacter cloacae complex NOT DETECTED NOT DETECTED Final   Escherichia coli  DETECTED (A) NOT DETECTED Final    Comment: CRITICAL RESULT CALLED TO, READ BACK BY AND VERIFIED WITH: PHARMD L SEAY [id number] 2141 FCP    Klebsiella aerogenes NOT DETECTED NOT DETECTED Final    Klebsiella oxytoca NOT DETECTED NOT DETECTED Final   Klebsiella pneumoniae NOT DETECTED NOT DETECTED Final   Proteus species NOT DETECTED NOT DETECTED Final   Salmonella species NOT DETECTED NOT DETECTED Final   Serratia marcescens NOT DETECTED NOT DETECTED Final   Haemophilus influenzae NOT DETECTED NOT DETECTED Final   Neisseria meningitidis NOT DETECTED NOT DETECTED Final   Pseudomonas aeruginosa NOT DETECTED NOT DETECTED Final   Stenotrophomonas maltophilia NOT DETECTED NOT DETECTED Final   Candida albicans NOT DETECTED NOT DETECTED Final   Candida auris NOT DETECTED NOT DETECTED Final   Candida glabrata NOT DETECTED NOT DETECTED Final   Candida krusei NOT DETECTED NOT DETECTED Final   Candida parapsilosis NOT DETECTED NOT DETECTED Final   Candida tropicalis NOT DETECTED NOT DETECTED Final   Cryptococcus neoformans/gattii NOT DETECTED NOT DETECTED Final   CTX-M ESBL DETECTED (A) NOT DETECTED Final    Comment: CRITICAL RESULT CALLED TO, READ BACK BY AND VERIFIED WITH: PHARMD L SEAY [id number] 2141 FCP (NOTE) Extended spectrum beta-lactamase detected. Recommend a carbapenem as initial therapy.      Carbapenem resistance IMP NOT DETECTED NOT DETECTED Final   Carbapenem resistance KPC NOT DETECTED NOT DETECTED Final   Carbapenem resistance NDM NOT DETECTED NOT DETECTED Final   Carbapenem resist OXA 48 LIKE NOT DETECTED NOT DETECTED Final   Carbapenem resistance VIM NOT DETECTED NOT DETECTED Final    Comment: Performed at [facility], [place] [address], [place], [place] [address]  Culture, blood (single)     Status: Abnormal   Collection Time: 01/25/21 10:35 AM   Specimen: BLOOD RIGHT HAND  Result Value Ref Range Status   Specimen Description BLOOD RIGHT HAND  Final   Special Requests   Final    BOTTLES DRAWN AEROBIC ONLY Blood Culture results may not be optimal due to an inadequate volume of blood received in culture bottles   Culture  Setup Time   Final    GRAM NEGATIVE  RODS AEROBIC BOTTLE ONLY CRITICAL VALUE NOTED.  VALUE IS CONSISTENT WITH PREVIOUSLY REPORTED AND CALLED VALUE.    Culture (A)  Final    ESCHERICHIA COLI SUSCEPTIBILITIES PERFORMED ON PREVIOUS CULTURE WITHIN THE LAST 5 DAYS. Performed at [facility], [place] [address], [place], [place] [address]    Report Status 01/27/2021 FINAL  Final  Urine Culture     Status: Abnormal   Collection Time: 01/25/21  1:19 PM   Specimen: In/Out Cath Urine  Result Value Ref Range Status   Specimen Description IN/OUT CATH URINE  Final   Special Requests   Final    NONE Performed at [facility], [place] [address] [place], [place] [address]    Culture (A)  Final    >=100,000 COLONIES/mL ESCHERICHIA COLI Confirmed Extended Spectrum Beta-Lactamase Producer (ESBL).  In bloodstream infections from ESBL organisms, carbapenems are preferred over piperacillin/tazobactam. They are shown to have a lower risk of mortality.    Report Status 01/27/2021 FINAL  Final   Organism ID, Bacteria ESCHERICHIA COLI (A)  Final      Susceptibility   Escherichia coli - MIC*    AMPICILLIN >=32 RESISTANT Resistant     CEFAZOLIN >=64 RESISTANT Resistant     CEFEPIME 4 INTERMEDIATE Intermediate  CEFTRIAXONE >=64 RESISTANT Resistant     CIPROFLOXACIN 0.5 SENSITIVE Sensitive     GENTAMICIN >=16 RESISTANT Resistant     IMIPENEM <=0.25 SENSITIVE Sensitive     NITROFURANTOIN <=16 SENSITIVE Sensitive     TRIMETH/SULFA >=320 RESISTANT Resistant     AMPICILLIN/SULBACTAM >=32 RESISTANT Resistant     PIP/TAZO <=4 SENSITIVE Sensitive     * >=100,000 COLONIES/mL ESCHERICHIA COLI  SARS CORONAVIRUS 2 (TAT 6-24 HRS) Nasopharyngeal Nasopharyngeal Swab     Status: None   Collection Time: 01/25/21  4:06 PM   Specimen: Nasopharyngeal Swab  Result Value Ref Range Status   SARS Coronavirus 2 NEGATIVE NEGATIVE Final    Comment: (NOTE) SARS-CoV-2 target nucleic acids are NOT DETECTED.  The SARS-CoV-2 RNA is generally detectable in  upper and lower respiratory specimens during the acute phase of infection. Negative results do not preclude SARS-CoV-2 infection, do not rule out co-infections with other pathogens, and should not be used as the sole basis for treatment or other patient management decisions. Negative results must be combined with clinical observations, patient history, and epidemiological information. The expected result is Negative.  Fact Sheet for Patients: SugarRoll.be  Fact Sheet for Healthcare Providers: https://www.woods-mathews.com/  This test is not yet approved or cleared by the [place] FDA and  has been authorized for detection and/or diagnosis of SARS-CoV-2 by FDA under an Emergency Use Authorization (EUA). This EUA will remain  in effect (meaning this test can be used) for the duration of the COVID-19 declaration under Se ction 564(b)(1) of the Act, 21 U.S.C. section 360bbb-3(b)(1), unless the authorization is terminated or revoked sooner.  Performed at [facility], [place] [address], [place], [place] [address]   MRSA Next Gen by PCR, Nasal     Status: None   Collection Time: 01/26/21  7:27 AM   Specimen: Nasal Mucosa; Nasal Swab  Result Value Ref Range Status   MRSA by PCR Next Gen NOT DETECTED NOT DETECTED Final    Comment: (NOTE) The GeneXpert MRSA Assay (FDA approved for NASAL specimens only), is one component of a comprehensive MRSA colonization surveillance program. It is not intended to diagnose MRSA infection nor to guide or monitor treatment for MRSA infections. Test performance is not FDA approved in patients less than 51 years old. Performed at [facility], [place] [address] [place], [place] [address]   Aerobic/Anaerobic Culture w Gram Stain (surgical/deep wound)     Status: None (Preliminary result)   Collection Time: 01/26/21  1:54 PM   Specimen: PATH Cytology Peritoneal fluid  Result Value Ref Range Status    Specimen Description PERITONEAL FLUID  Final   Special Requests NONE  Final   Gram Stain   Final    WBC PRESENT, PREDOMINANTLY MONONUCLEAR NO ORGANISMS SEEN CYTOSPIN SMEAR    Culture   Final    NO GROWTH 4 DAYS NO ANAEROBES ISOLATED; CULTURE IN PROGRESS FOR 5 DAYS Performed at [facility], [place] [address] [place], [place] [address]    Report Status PENDING  Incomplete         Radiology Studies: No results found.      Scheduled Meds:  (feeding supplement) PROSource Plus  30 mL Oral BID BM   aspirin  81 mg Oral Daily   atorvastatin  40 mg Oral QHS   carvedilol  3.125 mg Oral BID WC   Chlorhexidine Gluconate Cloth  6 each Topical Q0600   darbepoetin (ARANESP) injection - DIALYSIS  60 mcg  Intravenous Q Sat-HD   dextrose  12.5 g Intravenous Once   doxercalciferol  4 mcg Intravenous Q T,Th,Sa-HD   feeding supplement (NEPRO CARB STEADY)  237 mL Oral TID with meals   feeding supplement (PRO-STAT SUGAR FREE 64)  30 mL Oral q AM   FLUoxetine  20 mg Oral Daily   Gerhardt's butt cream   Topical TID   heparin injection (subcutaneous)  5,000 Units Subcutaneous Q8H   insulin aspart  0-6 Units Subcutaneous TID WC   levothyroxine  50 mcg Intravenous Daily   multivitamin  1 tablet Oral QHS   omega-3 acid ethyl esters  2 capsule Oral BID   pantoprazole  80 mg Oral Daily   phosphorus  250 mg Oral Once   saccharomyces boulardii  250 mg Oral BID   sodium bicarbonate  1,300 mg Oral BID   Continuous Infusions:  albumin human     meropenem (MERREM) IV 500 mg (01/29/21 2245)     LOS: 5 days    Time spent: 35 minutes.     Elmarie Shiley, MD [facility]   If 7PM-7AM, please contact night-coverage [email or web address]  01/30/2021, 3:35 PM

## 2021-01-30 NOTE — Progress Notes (Signed)
Pharmacy Antibiotic Note  [name] Michael is a 60 y.o. female admitted on 01/25/2021 with bacteremia and UTI.  Pharmacy has been consulted for meropenem dosing.  Meropenem 500 mg IV q24h for ESBL E.coli bacteremia likely source UTI,  ruled out for SBP.  WBC WNL, afebrile, LA 2.6 ESRD on HD TTS, next HD on 8/12 Fri off sched d/t staffing issues. Plan: Continue Meropenem '500mg'$  IV q24h Monitor for LOT, clinical course  Height: '4\' 6"'$  (137.2 cm) Weight: 40 kg (88 lb 2.9 oz) IBW/kg (Calculated) : 31.7  Temp (24hrs), Avg:98.2 F (36.8 C), Min:97.6 F (36.4 C), Max:98.5 F (36.9 C)  Recent Labs  Lab 01/25/21 0857 01/25/21 0915 01/25/21 1057 01/25/21 1524 01/27/21 0616 01/28/21 0828 01/29/21 0102 01/29/21 0611 01/30/21 0215  WBC  --    < >  --    < > 9.3 9.3 6.0 5.6 5.4  CREATININE  --    < >  --    < > 1.63* 2.59* 3.07* 3.09* 1.77*  LATICACIDVEN 2.6*  --  3.0*  --   --   --   --   --   --    < > = values in this interval not displayed.     Estimated Creatinine Clearance: 18.7 mL/min (A) (by C-G formula based on SCr of 1.77 mg/dL (H)).    Allergies  Allergen Reactions   Hydrocodone Nausea And Vomiting and Other (See Comments)    "Allergic," per [facility]    Antimicrobials this admission: Cefepime x 1 8/5 Flagyl x 1 8/5 Meropenem 8/5 >>  Dose adjustments this admission:  Microbiology results: 8/5 Ucx - ESBL E.col  (S to cipro, macrobid, and imipenem) 8/5: Bld cxs: ESBL E.coli 8/6 peritoneal fluid cx: ngtd x4d    [name] Cella, [place] [phone] Please utilize Amion for appropriate phone number to reach the unit pharmacist ([place])  01/30/2021 2:34 PM

## 2021-01-30 NOTE — Progress Notes (Addendum)
Occupational Therapy Treatment Patient Details Name: [name] Michael MRN: [id number] DOB: [birth year] Today's Date: 01/30/2021    History of present illness 60yo female admitted 01/25/21 with AMS and stomach distension. Found to have acute metabolic encephalopathy, abdominal ascites, and acute respiratory failure with hypoxia. PMH ESRD on HD, HLD, HTN, CVA, syncope, TIA, tobacco abuse, DM, O2 use PRN   OT comments  Pt able to get up to chair with +2 mod assist. Appears to not be aware of incontinence of bowel incontinence. Total assist for pericare, min assist to change soiled gown and set up once in chair for grooming. Slow response and mobility speed. VSS on 2L 02.  Follow Up Recommendations  SNF;Supervision/Assistance - 24 hour    Equipment Recommendations  None recommended by OT    Recommendations for Other Services      Precautions / Restrictions Precautions Precautions: Fall Precaution Comments: watch sats Restrictions Weight Bearing Restrictions: No       Mobility Bed Mobility Overal bed mobility: Needs Assistance Bed Mobility: Rolling;Supine to Sit Rolling: Min assist   Supine to sit: Max assist;HOB elevated     General bed mobility comments: requires assist to bring legs off bed and max assist to raise trunk to seated position. Min guard to supervision with sitting    Transfers Overall transfer level: Needs assistance Equipment used: Rolling walker (2 wheeled) Transfers: Sit to/from [facility] Sit to Stand: Mod assist;+2 physical assistance Stand pivot transfers: Mod assist;+2 physical assistance       General transfer comment: assist to rise and steady from bed and while periarea cleaned, slow to move from bed to chair    Balance Overall balance assessment: Needs assistance Sitting-balance support: Feet unsupported;Bilateral upper extremity supported Sitting balance-Leahy Scale: Fair Sitting balance - Comments: posterior bias- close min  guard to MinA for balance Postural control: Posterior lean Standing balance support: Bilateral upper extremity supported;During functional activity Standing balance-Leahy Scale: Poor Standing balance comment: ModAx2 and RW for balance                           ADL either performed or assessed with clinical judgement   ADL Overall ADL's : Needs assistance/impaired     Grooming: Wash/dry hands;Oral care;Sitting;Set up           Upper Body Dressing : Minimal assistance;Sitting           Toileting- Clothing Manipulation and Hygiene: +2 for physical assistance;Total assistance;Sit to/from stand Toileting - Clothing Manipulation Details (indicate cue type and reason): pt with constant diarrhea     Functional mobility during ADLs: +2 for physical assistance;Moderate assistance;Rolling walker (bed to chair)       Vision       Perception     Praxis      Cognition Arousal/Alertness: Awake/alert Behavior During Therapy: Flat affect Overall Cognitive Status: No family/caregiver present to determine baseline cognitive functioning Area of Impairment: Problem solving                         Safety/Judgement: Decreased awareness of safety;Decreased awareness of deficits Awareness: Intellectual Problem Solving: Slow processing;Decreased initiation;Difficulty sequencing;Requires verbal cues General Comments: Patient is slow to respond to commands and questions.        Exercises     Shoulder Instructions       General Comments      Pertinent Vitals/ Pain       Pain Assessment:  Faces Faces Pain Scale: Hurts a little bit Pain Location: bottom when being cleaned Pain Descriptors / Indicators: Discomfort;Sore;Grimacing Pain Intervention(s): Monitored during session;Repositioned  Home Living                                          Prior Functioning/Environment              Frequency  Min 2X/week        Progress  Toward Goals  OT Goals(current goals can now be found in the care plan section)  Progress towards OT goals: Progressing toward goals  Acute Rehab OT Goals Patient Stated Goal: to get up to recliner OT Goal Formulation: With patient Time For Goal Achievement: 02/10/21 Potential to Achieve Goals: [place] Discharge plan remains appropriate    Co-evaluation    PT/OT/SLP Co-Evaluation/Treatment: Yes Reason for Co-Treatment: For patient/therapist safety PT goals addressed during session: Mobility/safety with mobility;Balance OT goals addressed during session: ADL's and self-care      AM-PAC OT "6 Clicks" Daily Activity     Outcome Measure   Help from another person eating meals?: A Little Help from another person taking care of personal grooming?: A Little Help from another person toileting, which includes using toliet, bedpan, or urinal?: Total Help from another person bathing (including washing, rinsing, drying)?: A Lot Help from another person to put on and taking off regular upper body clothing?: A Little Help from another person to put on and taking off regular lower body clothing?: Total 6 Click Score: 13    End of Session Equipment Utilized During Treatment: Rolling walker;Gait belt;Oxygen (2L)  OT Visit Diagnosis: Unsteadiness on feet (R26.81);Other abnormalities of gait and mobility (R26.89);Muscle weakness (generalized) (M62.81)   Activity Tolerance Patient tolerated treatment well   Patient Left in chair;with call bell/phone within reach;with chair alarm set   Nurse Communication Mobility status        Time: 1031-1101 OT Time Calculation (min): 30 min  Charges: OT General Charges $OT Visit: 1 Visit OT Treatments $Self Care/Home Management : 8-22 mins  [name], OTR/L Acute Rehabilitation Services Pager: [phone] Office: [phone]    [name] 01/30/2021, 2:12 PM

## 2021-01-30 NOTE — Progress Notes (Addendum)
[facility] Progress Note   Subjective: More awake and interactive today. No C/Os. Next HD 02/01/2021 D/T staffing issues on HD unit.   Objective Vitals:   01/29/21 2346 01/30/21 0344 01/30/21 0422 01/30/21 0731  BP: (!) 145/71 (!) 143/75  132/74  Pulse: 76 78  78  Resp: '20 19  17  '$ Temp: 98.3 F (36.8 C) 98 F (36.7 C)  97.6 F (36.4 C)  TempSrc: Oral Oral  Oral  SpO2: 91% 100%  94%  Weight:  40.6 kg 40 kg   Height:       Physical Exam General: Chronically ill appearing female in NAD Heart: S1,S2 No M/R/G Lungs: CTAB Abdomen: Ascites present. Active NS, NT Extremities: No LE edema.  Dialysis Access: L AVF + T/B    Additional Objective Labs: Basic Metabolic Panel: Recent Labs  Lab 01/25/21 2114 01/26/21 0136 01/29/21 0102 01/29/21 0611 01/30/21 0215  NA 139   < > 137 136 135  K 3.1*   < > 3.1* 3.3* 3.9  CL 98   < > 98 98 97*  CO2 27   < > 30 31 32  GLUCOSE 105*   < > 161* 185* 116*  BUN 13   < > 28* 30* 12  CREATININE 2.18*   < > 3.07* 3.09* 1.77*  CALCIUM 8.9   < > 9.7 9.7 8.4*  PHOS 2.3*  --   --  1.4*  --    < > = values in this interval not displayed.   Liver Function Tests: Recent Labs  Lab 01/28/21 0828 01/29/21 0102 01/29/21 0611 01/30/21 0215  AST 19 26  --  25  ALT 14 15  --  15  ALKPHOS 73 79  --  79  BILITOT 1.4* 1.0  --  1.2  PROT 5.5* 5.4*  --  5.5*  ALBUMIN 2.3* 2.2* 2.1* 2.2*   No results for input(s): LIPASE, AMYLASE in the last 168 hours. CBC: Recent Labs  Lab 01/27/21 0616 01/28/21 0828 01/29/21 0102 01/29/21 0611 01/30/21 0215  WBC 9.3 9.3 6.0 5.6 5.4  NEUTROABS 8.4* 8.4* 5.0  --  4.0  HGB 8.9* 9.5* 9.2* 9.1* 9.4*  HCT 29.4* 31.1* 30.0* 30.8* 30.7*  MCV 91.6 90.7 90.9 91.4 89.8  PLT 79* 94* 88* 86* 96*   Blood Culture    Component Value Date/Time   SDES PERITONEAL FLUID 01/26/2021 1354   SPECREQUEST NONE 01/26/2021 1354   CULT  01/26/2021 1354    NO GROWTH 3 DAYS NO ANAEROBES ISOLATED; CULTURE IN  PROGRESS FOR 5 DAYS Performed at [facility], [place] [address], [place], [place] [address]    REPTSTATUS PENDING 01/26/2021 1354    Cardiac Enzymes: No results for input(s): CKTOTAL, CKMB, CKMBINDEX, TROPONINI in the last 168 hours. CBG: Recent Labs  Lab 01/29/21 0736 01/29/21 1149 01/29/21 1710 01/29/21 2048 01/30/21 0734  GLUCAP 176* 167* 94 123* 107*   Iron Studies: No results for input(s): IRON, TIBC, TRANSFERRIN, FERRITIN in the last 72 hours. '@lablastinr3'$ @ Studies/Results: No results found. Medications:  albumin human     meropenem (MERREM) IV 500 mg (01/29/21 2245)    (feeding supplement) PROSource Plus  30 mL Oral BID BM   aspirin  81 mg Oral Daily   atorvastatin  40 mg Oral QHS   carvedilol  3.125 mg Oral BID WC   Chlorhexidine Gluconate Cloth  6 each Topical Q0600   darbepoetin (ARANESP) injection - DIALYSIS  60 mcg Intravenous Q Sat-HD  dextrose  12.5 g Intravenous Once   doxercalciferol  4 mcg Intravenous Q T,Th,Sa-HD   feeding supplement (NEPRO CARB STEADY)  237 mL Oral TID with meals   feeding supplement (PRO-STAT SUGAR FREE 64)  30 mL Oral q AM   FLUoxetine  20 mg Oral Daily   Gerhardt's butt cream   Topical TID   heparin injection (subcutaneous)  5,000 Units Subcutaneous Q8H   insulin aspart  0-6 Units Subcutaneous TID WC   levothyroxine  50 mcg Intravenous Daily   multivitamin  1 tablet Oral QHS   omega-3 acid ethyl esters  2 capsule Oral BID   pantoprazole  80 mg Oral Daily   phosphorus  250 mg Oral Once   saccharomyces boulardii  250 mg Oral BID   sodium bicarbonate  1,300 mg Oral BID     HD orders: [place] T,Th,S 3:45 hr 350/500 39.5 kg 2.0 K/ 2.0 Ca AVF -Heparin 2000 units IV TIW -Hectorol 5 mcg IV TIW -Mircera 50 mcg IV q 4 weeks (last dose 01/11/2021)     Assessment/Plan: 1. Acute encephalopathy D/T E Coli bacteremia-On meropenem per primary. Per primary 2. Ascites-S/P paracentesis 08/06 1.1 liters. No evidence of SBP. Per  primary 3. Acute on Chronic HFpEF-EF 55%. Ascites present, no peripheral edema. Continue lowing volume as tolerated. 4. ESRD -T,Th,S next HD 02/01/2021 D/T staffing issues in HD unit.  5. Anemia - HGB 9.5 Aranesp 60 mcg IV 01/26/21. 6. Secondary hyperparathyroidism - Low PO4, not on binders. C Ca High, Decrease VDRA. Recheck RFP in HD tomorrow. 7. HTN/volume -Had not been getting to EDW prior to adm. Net UF 08/06 4 liters Post wt 42 kg. Will continue lowering volume as tolerated. Continue Midodrine, hold carvedilol prior to HD. 8. Nutrition -Low albumin. DYS 2 diet-add fluid restrictions, protein supps. 9. DM per primary  Rita H. Brown NP-C 01/30/2021, 10:15 AM  [facility] [phone]  Pt seen, examined and agree w A/P as above.  Kelly Splinter  MD 01/30/2021, 4:12 PM

## 2021-01-31 LAB — MISC LABCORP TEST (SEND OUT): Labcorp test code: 9985

## 2021-01-31 LAB — AEROBIC/ANAEROBIC CULTURE W GRAM STAIN (SURGICAL/DEEP WOUND): Culture: NO GROWTH

## 2021-01-31 LAB — PROCALCITONIN: Procalcitonin: 3.04 ng/mL

## 2021-01-31 LAB — GLUCOSE, CAPILLARY
Glucose-Capillary: 194 mg/dL — ABNORMAL HIGH (ref 70–99)
Glucose-Capillary: 176 mg/dL — ABNORMAL HIGH (ref 70–99)
Glucose-Capillary: 142 mg/dL — ABNORMAL HIGH (ref 70–99)
Glucose-Capillary: 183 mg/dL — ABNORMAL HIGH (ref 70–99)

## 2021-01-31 LAB — SARS CORONAVIRUS 2 (TAT 6-24 HRS): SARS Coronavirus 2: NEGATIVE

## 2021-01-31 MED ORDER — MEROPENEM 500 MG IV SOLR
500.0000 mg | INTRAVENOUS | Status: AC
  Administered 2021-01-31: 500 mg via INTRAVENOUS
  Filled 2021-01-31: qty 0.5

## 2021-01-31 NOTE — Progress Notes (Signed)
This chaplain responded to PMT consult for spiritual care.  The chaplain understands from reading the chart notes the Pt. is a person of strong faith.  The chaplain is updated by the Pt. RN-John before the visit.    The Pt. is lying on her side at the time of visit. The Pt. responds to the call of her name upon arrival.  The Pt. is lethargic during the visit. There is a mixture of the Pt. answering questions appropriately and sleeping.   During the chaplain's check in with the Pt. pain, the Pt. indicated she had a headache. The chaplain updated the Pt. RN.  The chaplain learns the Pt. is a life long [place] and part of a [employer]. The chaplain understands the Pt. is open to the chaplain following up with a possible clergy visit.    The Pt. accepted the chaplain invitation for prayer and a F/U spiritual care visit.

## 2021-01-31 NOTE — Progress Notes (Signed)
Inpatient Diabetes Program Recommendations  AACE/ADA: New Consensus Statement on Inpatient Glycemic Control (2015)  Target Ranges:  Prepandial:   less than 140 mg/dL      Peak postprandial:   less than 180 mg/dL (1-2 hours)      Critically ill patients:  140 - 180 mg/dL   Lab Results  Component Value Date   GLUCAP 194 (H) 01/31/2021   HGBA1C 5.9 (H) 10/14/2020    Review of Glycemic Control Results for [name] Michael, [name] Michael (MRN [id number]) as of 01/31/2021 08:18  Ref. Range 01/30/2021 07:34 01/30/2021 11:50 01/30/2021 16:19 01/30/2021 20:51 01/30/2021 21:42 01/31/2021 07:36  Glucose-Capillary Latest Ref Range: 70 - 99 mg/dL 107 (H) 152 (H) 237 (H) 393 (H) 329 (H) 194 (H)   Diabetes history: DM 2 Outpatient Diabetes medications: None Current orders for Inpatient glycemic control:  Novolog 0-6 units tid + hs  Inpatient Diabetes Program Recommendations:   Glucose trends increase after meal intake Renal function elevated  - may consider Novolog 2 units tid meal coverage tid if eating >50% of meals  Thanks,  Tama Headings RN, MSN, BC-ADM Inpatient Diabetes Coordinator Team Pager [phone] (8a-5p)

## 2021-01-31 NOTE — Progress Notes (Signed)
[facility] Progress Note   Subjective: Seen in room. Animated and laughing today. No C/Os. Last dose ABX today. DC back to SNF tomorrow. HD tomorrow on adjusted schedule.    Objective Vitals:   01/31/21 0406 01/31/21 0515 01/31/21 0733 01/31/21 1142  BP: (!) 159/89  (!) 173/89 (!) 142/65  Pulse: 95 93 92 89  Resp: '15 14 17 19  '$ Temp: 98.5 F (36.9 C)  98.5 F (36.9 C) 98.3 F (36.8 C)  TempSrc: Axillary  Oral Oral  SpO2: 96% 100% 97% 95%  Weight:      Height:       Physical Exam General: Chronically ill appearing female in NAD Heart: S1,S2 No M/R/G Lungs: CTAB Abdomen: Ascites present. Active NS, NT Extremities: No LE edema.  Dialysis Access: L AVF + T/B   Additional Objective Labs: Basic Metabolic Panel: Recent Labs  Lab 01/25/21 2114 01/26/21 0136 01/29/21 0102 01/29/21 0611 01/30/21 0215  NA 139   < > 137 136 135  K 3.1*   < > 3.1* 3.3* 3.9  CL 98   < > 98 98 97*  CO2 27   < > 30 31 32  GLUCOSE 105*   < > 161* 185* 116*  BUN 13   < > 28* 30* 12  CREATININE 2.18*   < > 3.07* 3.09* 1.77*  CALCIUM 8.9   < > 9.7 9.7 8.4*  PHOS 2.3*  --   --  1.4*  --    < > = values in this interval not displayed.   Liver Function Tests: Recent Labs  Lab 01/28/21 0828 01/29/21 0102 01/29/21 0611 01/30/21 0215  AST 19 26  --  25  ALT 14 15  --  15  ALKPHOS 73 79  --  79  BILITOT 1.4* 1.0  --  1.2  PROT 5.5* 5.4*  --  5.5*  ALBUMIN 2.3* 2.2* 2.1* 2.2*   No results for input(s): LIPASE, AMYLASE in the last 168 hours. CBC: Recent Labs  Lab 01/27/21 0616 01/28/21 0828 01/29/21 0102 01/29/21 0611 01/30/21 0215  WBC 9.3 9.3 6.0 5.6 5.4  NEUTROABS 8.4* 8.4* 5.0  --  4.0  HGB 8.9* 9.5* 9.2* 9.1* 9.4*  HCT 29.4* 31.1* 30.0* 30.8* 30.7*  MCV 91.6 90.7 90.9 91.4 89.8  PLT 79* 94* 88* 86* 96*   Blood Culture    Component Value Date/Time   SDES PERITONEAL FLUID 01/26/2021 1354   SPECREQUEST NONE 01/26/2021 1354   CULT  01/26/2021 1354    No  growth aerobically or anaerobically. Performed at [facility], [place] [address], [place], [place] [address]    REPTSTATUS 01/31/2021 FINAL 01/26/2021 1354    Cardiac Enzymes: No results for input(s): CKTOTAL, CKMB, CKMBINDEX, TROPONINI in the last 168 hours. CBG: Recent Labs  Lab 01/30/21 1619 01/30/21 2051 01/30/21 2142 01/31/21 0736 01/31/21 1147  GLUCAP 237* 393* 329* 194* 176*   Iron Studies: No results for input(s): IRON, TIBC, TRANSFERRIN, FERRITIN in the last 72 hours. '@lablastinr3'$ @ Studies/Results: No results found. Medications:  albumin human     meropenem (MERREM) IV      (feeding supplement) PROSource Plus  30 mL Oral BID BM   aspirin  81 mg Oral Daily   atorvastatin  40 mg Oral QHS   carvedilol  3.125 mg Oral BID WC   Chlorhexidine Gluconate Cloth  6 each Topical Q0600   darbepoetin (ARANESP) injection - DIALYSIS  60 mcg Intravenous Q Sat-HD   dextrose  12.5 g Intravenous Once   doxercalciferol  4 mcg Intravenous Q T,Th,Sa-HD   feeding supplement (NEPRO CARB STEADY)  237 mL Oral TID with meals   FLUoxetine  20 mg Oral Daily   Gerhardt's butt cream   Topical TID   heparin injection (subcutaneous)  5,000 Units Subcutaneous Q8H   insulin aspart  0-5 Units Subcutaneous QHS   insulin aspart  0-6 Units Subcutaneous TID WC   levothyroxine  75 mcg Oral Q0600   multivitamin  1 tablet Oral QHS   omega-3 acid ethyl esters  2 capsule Oral BID   pantoprazole  80 mg Oral Daily   phosphorus  250 mg Oral Once   saccharomyces boulardii  250 mg Oral BID   sodium bicarbonate  1,300 mg Oral BID     HD orders: [place] T,Th,S 3:45 hr 350/500 39.5 kg 2.0 K/ 2.0 Ca AVF -Heparin 2000 units IV TIW -Hectorol 5 mcg IV TIW -Mircera 50 mcg IV q 4 weeks (last dose 01/11/2021)     Assessment/Plan: 1. Acute encephalopathy D/T E Coli bacteremia-On meropenem per primary. Last dose today. Per primary 2. Ascites-S/P paracentesis 08/06 1.1 liters. No evidence of SBP. Per  primary 3. Acute on Chronic HFpEF-EF 55%. Ascites present, no peripheral edema. Continue lowing volume as tolerated. 4. ESRD -T,Th,S next HD 02/01/2021 D/T staffing issues in HD unit.  5. Anemia - HGB 9.5 Aranesp 60 mcg IV 01/26/21. 6. Secondary hyperparathyroidism - Low PO4, not on binders. C Ca High, Decrease VDRA. Recheck RFP in HD tomorrow. 7. HTN/volume -Had not been getting to EDW prior to adm. Net UF 01/29/2021 3.1 liters Post wt 40.1kg  Close to OP EDW. Leave EDW as is on DC. UF as tolerated tomorrow. Continue Midodrine, hold carvedilol prior to HD. 8. Nutrition -Low albumin. DYS 2 diet-add fluid restrictions, protein supps.   Disposition: Back to SNF in AM. Will need HD 1st shift.    [name] Michael H. Cantrell Martus NP-C 01/31/2021, 12:27 PM  [employer] [phone]

## 2021-01-31 NOTE — Progress Notes (Signed)
PROGRESS NOTE    [name] Michael  [id number] DOB: [birth year] DOA: 01/25/2021 PCP: Charlott Rakes, MD   Brief Narrative: 60 year old old with past medical history significant for ESRD on hemodialysis, history of CVA, hypertension, diabetes type 2, chronic diastolic heart failure who presents to the ED from a skilled nursing facility complaining of altered mental status and abdominal distention, diagnosed with toxic encephalopathy with possible SBP and admitted to the hospital.  Subsequently SBP was ruled out.    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 coli bacteremia suspicious for source likely UTI SBO ruled out. Continue with meropenem. Day 7/7. -Blood culture (01/25/2021) grew E. coli ESBL. Paracentesis not suggestive of SBP. CT head no acute findings. Sepsis likely present on admission has resolved. Patient wants to continue with HD.  Plan to discharge tomorrow.  Encephalopathy improving.   2-ESRD with ascites, Acute hypoxic respiratory failure: fluid overload, ascites.  Dialysis TTS schedule. Use oxygen as needed at a skilled nursing facility. Nephrology assisting with hemodialysis.  3-Anemia of chronic disease Hb stable.   Chronic thrombocytopenia Stable.   Hypertension: Continue with Coreg  Hypophosphatemia: Replaced  Lactic  acidosis due to renal failure and ESRD, also sepsis on admission.  Severe protein caloric malnutrition with massive third spacing of fluid On protein  supplements  Diabetes type 2: Continue with sliding scale insulin GI; loose stool. Started florastore. Hypothyroidism: Change synthroid to oral.  Goals of care; Patient wants to continue with HD.   Estimated body mass index is 21.26 kg/m as calculated from the following:   Height as of this encounter: '4\' 6"'$  (1.372 m).   Weight as of this encounter: 40 kg.   DVT prophylaxis: Heparin Code Status: DNR Family Communication: No family at bedside.  Disposition Plan:  Status is: Inpatient  Remains inpatient appropriate because:IV treatments appropriate due to intensity of illness or inability to take PO  Dispo: The patient is from: SNF              Anticipated d/c is to: SNF              Patient currently is not medically stable to d/c.   Difficult to place patient No        Consultants:  Nephrology Palliative care  Procedures:  Ultrasound paracentesis with 1.1 L of fluid removed   Antimicrobials:    Subjective: She is alert, feeling ok, denies pain.   Objective: Vitals:   01/31/21 0406 01/31/21 0515 01/31/21 0733 01/31/21 1142  BP: (!) 159/89  (!) 173/89 (!) 142/65  Pulse: 95 93 92 89  Resp: '15 14 17 19  '$ Temp: 98.5 F (36.9 C)  98.5 F (36.9 C) 98.3 F (36.8 C)  TempSrc: Axillary  Oral Oral  SpO2: 96% 100% 97% 95%  Weight:      Height:        Intake/Output Summary (Last 24 hours) at 01/31/2021 1509 Last data filed at 01/31/2021 0900 Gross per 24 hour  Intake 340.88 ml  Output --  Net 340.88 ml    Filed Weights   01/29/21 1704 01/30/21 0344 01/30/21 0422  Weight: 40.1 kg 40.6 kg 40 kg    Examination:  General exam: Chronic ill appearing Respiratory system: CTA Cardiovascular system:  S 1, S 2 RRR Gastrointestinal system: BS present, soft, distend.  Central nervous system: alert, oriented.  Extremities: no edema   Data Reviewed: I have personally reviewed following labs and imaging studies  CBC: Recent Labs  Lab 01/25/21 1524  01/26/21 0136 01/27/21 0616 01/28/21 0828 01/29/21 0102 01/29/21 0611 01/30/21 0215  WBC 8.5   < > 9.3 9.3 6.0 5.6 5.4  NEUTROABS 7.9*  --  8.4* 8.4* 5.0  --  4.0  HGB 9.8*   < > 8.9* 9.5* 9.2* 9.1* 9.4*  HCT 33.9*   < > 29.4* 31.1* 30.0* 30.8* 30.7*  MCV 96.0   < > 91.6 90.7 90.9 91.4 89.8  PLT 88*   < > 79* 94* 88* 86* 96*   < > = values in this interval not displayed.    Basic Metabolic Panel: Recent Labs  Lab 01/25/21 2114 01/26/21 0136 01/27/21 [id number] 01/28/21 0828 01/29/21 0102 01/29/21 0611 01/30/21 0215  NA 139   < > 137 138 137 136 135  K 3.1*   < > 3.2* 3.2* 3.1* 3.3* 3.9  CL 98   < > 101 99 98 98 97*  CO2 27   < > '27 30 30 31 '$ 32  GLUCOSE 105*   < > 165* 148* 161* 185* 116*  BUN 13   < > 11 21* 28* 30* 12  CREATININE 2.18*   < > 1.63* 2.59* 3.07* 3.09* 1.77*  CALCIUM 8.9   < > 8.6* 9.3 9.7 9.7 8.4*  MG 1.7  --   --   --   --   --   --   PHOS 2.3*  --   --   --   --  1.4*  --    < > = values in this interval not displayed.    GFR: Estimated Creatinine Clearance: 18.7 mL/min (A) (by C-G formula based on SCr of 1.77 mg/dL (H)). Liver Function Tests: Recent Labs  Lab 01/26/21 0136 01/27/21 0616 01/28/21 0828 01/29/21 0102 01/29/21 0611 01/30/21 0215  AST '26 22 19 26  '$ --  25  ALT '15 15 14 15  '$ --  15  ALKPHOS 66 63 73 79  --  79  BILITOT 1.5* 1.3* 1.4* 1.0  --  1.2  PROT 5.4* 5.0* 5.5* 5.4*  --  5.5*  ALBUMIN 2.4* 2.1* 2.3* 2.2* 2.1* 2.2*    No results for input(s): LIPASE, AMYLASE in the last 168 hours. Recent Labs  Lab 01/25/21 0926  AMMONIA 34    Coagulation Profile: Recent Labs  Lab 01/25/21 0915 01/26/21 0136  INR 1.5* 1.8*    Cardiac Enzymes: No results for input(s): CKTOTAL, CKMB, CKMBINDEX, TROPONINI in the last 168 hours. BNP (last 3 results) No results for input(s): PROBNP in the last 8760 hours. HbA1C: No results for input(s): HGBA1C in the last 72 hours. CBG: Recent Labs  Lab 01/30/21 1619 01/30/21 2051 01/30/21 2142  01/31/21 0736 01/31/21 1147  GLUCAP 237* 393* 329* 194* 176*    Lipid Profile: No results for input(s): CHOL, HDL, LDLCALC, TRIG, CHOLHDL, LDLDIRECT in the last 72 hours. Thyroid Function Tests: No results for input(s): TSH, T4TOTAL, FREET4, T3FREE, THYROIDAB in the last 72 hours. Anemia Panel: No results for input(s): VITAMINB12, FOLATE, FERRITIN, TIBC,  IRON, RETICCTPCT in the last 72 hours. Sepsis Labs: Recent Labs  Lab 01/25/21 0857 01/25/21 1057 01/26/21 0726 01/28/21 0828 01/29/21 0611 01/30/21 0215 01/31/21 0319  PROCALCITON  --   --    < > 6.94 5.04 4.15 3.04  LATICACIDVEN 2.6* 3.0*  --   --   --   --   --    < > = values in this interval not displayed.     Recent Results (from the past 240 hour(s))  Blood culture (routine single)     Status: Abnormal   Collection Time: 01/25/21  9:19 AM   Specimen: BLOOD  Result Value Ref Range Status   Specimen Description BLOOD RIGHT ANTECUBITAL  Final   Special Requests   Final    BOTTLES DRAWN AEROBIC AND ANAEROBIC Blood Culture results may not be optimal due to an inadequate volume of blood received in culture bottles   Culture  Setup Time   Final    GRAM NEGATIVE RODS IN BOTH AEROBIC AND ANAEROBIC BOTTLES CRITICAL RESULT CALLED TO, READ BACK BY AND VERIFIED WITH: Cottie Banda [id number] 2141 FCP Performed at [facility], [place] [address] [place], [place] [address]    Culture (A)  Final    ESCHERICHIA COLI Confirmed Extended Spectrum Beta-Lactamase Producer (ESBL).  In bloodstream infections from ESBL organisms, carbapenems are preferred over piperacillin/tazobactam. They are shown to have a lower risk of mortality.    Report Status 01/27/2021 FINAL  Final   Organism ID, Bacteria ESCHERICHIA COLI  Final      Susceptibility   Escherichia coli - MIC*    AMPICILLIN >=32 RESISTANT Resistant     CEFAZOLIN >=64 RESISTANT Resistant     CEFEPIME 2 SENSITIVE Sensitive     CEFTAZIDIME RESISTANT Resistant     CEFTRIAXONE  >=64 RESISTANT Resistant     CIPROFLOXACIN 0.5 SENSITIVE Sensitive     GENTAMICIN >=16 RESISTANT Resistant     IMIPENEM <=0.25 SENSITIVE Sensitive     TRIMETH/SULFA >=320 RESISTANT Resistant     AMPICILLIN/SULBACTAM >=32 RESISTANT Resistant     PIP/TAZO <=4 SENSITIVE Sensitive     * ESCHERICHIA COLI  Blood Culture ID Panel (Reflexed)     Status: Abnormal   Collection Time: 01/25/21  9:19 AM  Result Value Ref Range Status   Enterococcus faecalis NOT DETECTED NOT DETECTED Final   Enterococcus Faecium NOT DETECTED NOT DETECTED Final   Listeria monocytogenes NOT DETECTED NOT DETECTED Final   Staphylococcus species NOT DETECTED NOT DETECTED Final   Staphylococcus aureus (BCID) NOT DETECTED NOT DETECTED Final   Staphylococcus epidermidis NOT DETECTED NOT DETECTED Final   Staphylococcus lugdunensis NOT DETECTED NOT DETECTED Final   Streptococcus species NOT DETECTED NOT DETECTED Final   Streptococcus agalactiae NOT DETECTED NOT DETECTED Final   Streptococcus pneumoniae NOT DETECTED NOT DETECTED Final   Streptococcus pyogenes NOT DETECTED NOT DETECTED Final   A.calcoaceticus-baumannii NOT DETECTED NOT DETECTED Final   Bacteroides fragilis NOT DETECTED NOT DETECTED Final   Enterobacterales DETECTED (A) NOT DETECTED Final    Comment: Enterobacterales represent a large order of gram negative bacteria, not a single organism. CRITICAL RESULT CALLED TO, READ BACK BY AND VERIFIED WITH: PHARMD L SEAY [id number] 2141 FCP    Enterobacter cloacae complex NOT DETECTED NOT DETECTED Final   Escherichia coli DETECTED (A) NOT DETECTED Final    Comment: CRITICAL RESULT CALLED TO, READ BACK BY AND VERIFIED WITH: PHARMD L SEAY [id number] 2141 FCP    Klebsiella aerogenes NOT DETECTED  NOT DETECTED Final   Klebsiella oxytoca NOT DETECTED NOT DETECTED Final   Klebsiella pneumoniae NOT DETECTED NOT DETECTED Final   Proteus species NOT DETECTED NOT DETECTED Final   Salmonella species NOT DETECTED NOT DETECTED Final    Serratia marcescens NOT DETECTED NOT DETECTED Final   Haemophilus influenzae NOT DETECTED NOT DETECTED Final   Neisseria meningitidis NOT DETECTED NOT DETECTED Final   Pseudomonas aeruginosa NOT DETECTED NOT DETECTED Final   Stenotrophomonas maltophilia NOT DETECTED NOT DETECTED Final   Candida albicans NOT DETECTED NOT DETECTED Final   Candida auris NOT DETECTED NOT DETECTED Final   Candida glabrata NOT DETECTED NOT DETECTED Final   Candida krusei NOT DETECTED NOT DETECTED Final   Candida parapsilosis NOT DETECTED NOT DETECTED Final   Candida tropicalis NOT DETECTED NOT DETECTED Final   Cryptococcus neoformans/gattii NOT DETECTED NOT DETECTED Final   CTX-M ESBL DETECTED (A) NOT DETECTED Final    Comment: CRITICAL RESULT CALLED TO, READ BACK BY AND VERIFIED WITH: PHARMD L SEAY [id number] 2141 FCP (NOTE) Extended spectrum beta-lactamase detected. Recommend a carbapenem as initial therapy.      Carbapenem resistance IMP NOT DETECTED NOT DETECTED Final   Carbapenem resistance KPC NOT DETECTED NOT DETECTED Final   Carbapenem resistance NDM NOT DETECTED NOT DETECTED Final   Carbapenem resist OXA 48 LIKE NOT DETECTED NOT DETECTED Final   Carbapenem resistance VIM NOT DETECTED NOT DETECTED Final    Comment: Performed at [facility], [place] [address] [place], [place] [address]  Culture, blood (single)     Status: Abnormal   Collection Time: 01/25/21 10:35 AM   Specimen: BLOOD RIGHT HAND  Result Value Ref Range Status   Specimen Description BLOOD RIGHT HAND  Final   Special Requests   Final    BOTTLES DRAWN AEROBIC ONLY Blood Culture results may not be optimal due to an inadequate volume of blood received in culture bottles   Culture  Setup Time   Final    GRAM NEGATIVE RODS AEROBIC BOTTLE ONLY CRITICAL VALUE NOTED.  VALUE IS CONSISTENT WITH PREVIOUSLY REPORTED AND CALLED VALUE.    Culture (A)  Final    ESCHERICHIA COLI SUSCEPTIBILITIES PERFORMED ON PREVIOUS CULTURE WITHIN THE LAST  5 DAYS. Performed at [facility], [place] [address] [place], [place] [address]    Report Status 01/27/2021 FINAL  Final  Urine Culture     Status: Abnormal   Collection Time: 01/25/21  1:19 PM   Specimen: In/Out Cath Urine  Result Value Ref Range Status   Specimen Description IN/OUT CATH URINE  Final   Special Requests   Final    NONE Performed at [facility], [place] [address], [place], [place] [address]    Culture (A)  Final    >=100,000 COLONIES/mL ESCHERICHIA COLI Confirmed Extended Spectrum Beta-Lactamase Producer (ESBL).  In bloodstream infections from ESBL organisms, carbapenems are preferred over piperacillin/tazobactam. They are shown to have a lower risk of mortality.    Report Status 01/27/2021 FINAL  Final   Organism ID, Bacteria ESCHERICHIA COLI (A)  Final      Susceptibility   Escherichia coli - MIC*    AMPICILLIN >=32 RESISTANT Resistant     CEFAZOLIN >=64 RESISTANT Resistant     CEFEPIME 4 INTERMEDIATE Intermediate     CEFTRIAXONE >=64 RESISTANT Resistant     CIPROFLOXACIN 0.5 SENSITIVE Sensitive     GENTAMICIN >=16 RESISTANT Resistant     IMIPENEM <=0.25 SENSITIVE Sensitive  NITROFURANTOIN <=16 SENSITIVE Sensitive     TRIMETH/SULFA >=320 RESISTANT Resistant     AMPICILLIN/SULBACTAM >=32 RESISTANT Resistant     PIP/TAZO <=4 SENSITIVE Sensitive     * >=100,000 COLONIES/mL ESCHERICHIA COLI  SARS CORONAVIRUS 2 (TAT 6-24 HRS) Nasopharyngeal Nasopharyngeal Swab     Status: None   Collection Time: 01/25/21  4:06 PM   Specimen: Nasopharyngeal Swab  Result Value Ref Range Status   SARS Coronavirus 2 NEGATIVE NEGATIVE Final    Comment: (NOTE) SARS-CoV-2 target nucleic acids are NOT DETECTED.  The SARS-CoV-2 RNA is generally detectable in upper and lower respiratory specimens during the acute phase of infection. Negative results do not preclude SARS-CoV-2 infection, do not rule out co-infections with other pathogens, and should not be used as the sole  basis for treatment or other patient management decisions. Negative results must be combined with clinical observations, patient history, and epidemiological information. The expected result is Negative.  Fact Sheet for Patients: SugarRoll.be  Fact Sheet for Healthcare Providers: https://www.woods-mathews.com/  This test is not yet approved or cleared by the [place] FDA and  has been authorized for detection and/or diagnosis of SARS-CoV-2 by FDA under an Emergency Use Authorization (EUA). This EUA will remain  in effect (meaning this test can be used) for the duration of the COVID-19 declaration under Se ction 564(b)(1) of the Act, 21 U.S.C. section 360bbb-3(b)(1), unless the authorization is terminated or revoked sooner.  Performed at [facility], [place] [address], [place], [place] [address]   MRSA Next Gen by PCR, Nasal     Status: None   Collection Time: 01/26/21  7:27 AM   Specimen: Nasal Mucosa; Nasal Swab  Result Value Ref Range Status   MRSA by PCR Next Gen NOT DETECTED NOT DETECTED Final    Comment: (NOTE) The GeneXpert MRSA Assay (FDA approved for NASAL specimens only), is one component of a comprehensive MRSA colonization surveillance program. It is not intended to diagnose MRSA infection nor to guide or monitor treatment for MRSA infections. Test performance is not FDA approved in patients less than 43 years old. Performed at [facility], [place] [address], [place], [place] [address]   Aerobic/Anaerobic Culture w Gram Stain (surgical/deep wound)     Status: None   Collection Time: 01/26/21  1:54 PM   Specimen: PATH Cytology Peritoneal fluid  Result Value Ref Range Status   Specimen Description PERITONEAL FLUID  Final   Special Requests NONE  Final   Gram Stain   Final    WBC PRESENT, PREDOMINANTLY MONONUCLEAR NO ORGANISMS SEEN CYTOSPIN SMEAR    Culture   Final    No growth aerobically or  anaerobically. Performed at [facility], [place] [address], [place], [place] [address]    Report Status 01/31/2021 FINAL  Final         Radiology Studies: No results found.      Scheduled Meds:  (feeding supplement) PROSource Plus  30 mL Oral BID BM   aspirin  81 mg Oral Daily   atorvastatin  40 mg Oral QHS   carvedilol  3.125 mg Oral BID WC   Chlorhexidine Gluconate Cloth  6 each Topical Q0600   darbepoetin (ARANESP) injection - DIALYSIS  60 mcg Intravenous Q Sat-HD   dextrose  12.5 g Intravenous Once   doxercalciferol  4 mcg Intravenous Q T,Th,Sa-HD   feeding supplement (NEPRO CARB STEADY)  237 mL Oral TID with meals   FLUoxetine  20 mg Oral  Daily   Gerhardt's butt cream   Topical TID   heparin injection (subcutaneous)  5,000 Units Subcutaneous Q8H   insulin aspart  0-5 Units Subcutaneous QHS   insulin aspart  0-6 Units Subcutaneous TID WC   levothyroxine  75 mcg Oral Q0600   multivitamin  1 tablet Oral QHS   omega-3 acid ethyl esters  2 capsule Oral BID   pantoprazole  80 mg Oral Daily   phosphorus  250 mg Oral Once   saccharomyces boulardii  250 mg Oral BID   sodium bicarbonate  1,300 mg Oral BID   Continuous Infusions:  albumin human     meropenem (MERREM) IV       LOS: 6 days    Time spent: 35 minutes.     Elmarie Shiley, MD [facility]   If 7PM-7AM, please contact night-coverage [email or web address]  01/31/2021, 3:09 PM

## 2021-01-31 NOTE — TOC Initial Note (Signed)
Transition of Care [facility]) - Initial/Assessment Note    Patient Details  Name: [name] Michael MRN: [id number] Date of Birth: [birth year]  Transition of Care [facility]) CM/SW Contact:    Tom-Johnson, Renea Ee, RN Phone Number: 01/31/2021, 11:32 AM  Clinical Narrative:                 Spoke with nurse at [facility] about patient tentatively returning back tomorrow. Patient will be medically ready tomorrow. Palliative consulted with her and sister would like patient to continue dialysis. [place] made aware of that. Will continue to follow for TOC needs.  Expected Discharge Plan: Skilled Nursing Facility Barriers to Discharge: Continued Medical Work up   Patient Goals and CMS Choice Patient states their goals for this hospitalization and ongoing recovery are:: To recover at [facility] CMS Medicare.gov Compare Post Acute Care list provided to:: Patient Choice offered to / list presented to : Patient  Expected Discharge Plan and Services Expected Discharge Plan: [place] In-house Referral: Clinical Social Work Discharge Planning Services: CM Consult Post Acute Care Choice: [place] Living arrangements for the past 2 months: [place]                                      Prior Living Arrangements/Services Living arrangements for the past 2 months: [place] Lives with:: Facility Resident Patient language and need for interpreter reviewed:: Yes Do you feel safe going back to the place where you live?: Yes      Need for Family Participation in Patient Care: Yes (Comment) Care giver support system in place?: Yes (comment)   Criminal Activity/Legal Involvement Pertinent to Current Situation/Hospitalization: No - Comment as needed  Activities of Daily Living Home Assistive Devices/Equipment: Bedside commode/3-in-1, Transfer belt, Other (Comment)    Permission Sought/Granted Permission sought to share information  with : Case Manager, Customer service manager Permission granted to share information with : Yes, Verbal Permission Granted     Permission granted to share info w AGENCY: [facility]        Emotional Assessment Appearance:: Appears stated age Attitude/Demeanor/Rapport: Engaged Affect (typically observed): Accepting Orientation: : Oriented to Self, Oriented to Place, Oriented to  Time, Oriented to Situation (Delayed response.) Alcohol / Substance Use: Never Used Psych Involvement: No (comment)  Admission diagnosis:  Hypokalemia [E87.6] Generalized abdominal pain [R10.84] Ascites 99991111 Acute metabolic encephalopathy 99991111 Sepsis without acute organ dysfunction, due to unspecified organism [facility]) [A41.9] Patient Active Problem List   Diagnosis Date Noted   Abdominal ascites 01/25/2021   Lymphocytopenia 10/13/2020   Ear hematoma, right, initial encounter 10/13/2020   Prolonged QT interval 10/13/2020   Lactic acidosis 10/05/2020   Transaminitis 10/05/2020   Thrombocytopenia ([place]) 10/05/2020   Acute respiratory failure with hypoxia ([place]) 10/05/2020   Altered mental status 0000000   Acute metabolic encephalopathy 99991111   Severe tricuspid regurgitation 12/04/2019   ESRD (end stage renal disease) ([place])    GERD (gastroesophageal reflux disease)    Hypertension    Type II diabetes mellitus ([place])    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   AMS (altered mental status) 04/28/2019   Hypoxemia 04/28/2019   Fall 06/09/2018   Fall at home, initial encounter 06/09/2018   Anemia of chronic disease 06/09/2018   Syncope and collapse 06/09/2018   Acute encephalopathy 03/10/2018   Hypermagnesemia 03/10/2018   Acute lower UTI 11/09/2016   Uncontrolled  type 2 diabetes mellitus with complication (HCC)    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 associated with diabetes mellitus due to underlying condition ([place])    Chronic diastolic CHF (congestive heart failure)  ([place])    Hypokalemia 06/11/2016   Proliferative diabetic retinopathy ([place]) 04/29/2016   Poor social situation 03/09/2013   Depression 03/01/2013   Abnormal mammogram 12/20/2012   Retinal detachment 11/17/2012   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 ([place]) [id number]   Hyperlipidemia 02/09/2007   Essential hypertension 02/09/2007   PCP:  Charlott Rakes, MD Pharmacy:  No Pharmacies Listed    Social Determinants of Health (SDOH) Interventions    Readmission Risk Interventions Readmission Risk Prevention Plan 12/07/2019 04/29/2019  Transportation Screening Complete Complete  PCP or Specialist Appt within 3-5 Days Complete Complete  HRI or [place] Complete Complete  Social Work Consult for [place] Planning/Counseling Complete Complete  Palliative Care Screening Not Applicable Not Applicable  Medication Review (RN Care Manager) Referral to Pharmacy Referral to Pharmacy  Some recent data might be hidden

## 2021-01-31 NOTE — TOC Progression Note (Signed)
Transition of Care [facility]) - Progression Note    Patient Details  Name: [name] Michael MRN: [id number] Date of Birth: 1960/08/18  Transition of Care [facility]) CM/SW [place], LCSW Phone Number: 01/31/2021, 3:09 PM  Clinical Narrative:    CSW updated patient's sister [name] that patient may be ready to discharge tomorrow.   Expected Discharge Plan: [place] Barriers to Discharge: Continued Medical Work up  Expected Discharge Plan and Services Expected Discharge Plan: [place] In-house Referral: Clinical Social Work Discharge Planning Services: CM Consult Post Acute Care Choice: [place] Living arrangements for the past 2 months: [place]                                       Social Determinants of Health (SDOH) Interventions    Readmission Risk Interventions Readmission Risk Prevention Plan 01/31/2021 12/07/2019 04/29/2019  Transportation Screening Complete Complete Complete  PCP or Specialist Appt within 3-5 Days - Complete Complete  HRI or Home Care Consult - Complete Complete  Social Work Consult for [place] Planning/Counseling - Complete Complete  Palliative Care Screening - Not Applicable Not Applicable  Medication Review Press photographer) Referral to Pharmacy Referral to Pharmacy Referral to Pharmacy  PCP or Specialist appointment within 3-5 days of discharge Complete - -  HRI or Home Care Consult Complete - -  SW Recovery Care/Counseling Consult Complete - -  Palliative Care Screening Not Applicable - -  Skilled Nursing Facility Complete - -  Some recent data might be hidden

## 2021-02-01 LAB — RENAL FUNCTION PANEL
Albumin: 2.4 g/dL — ABNORMAL LOW (ref 3.5–5.0)
Anion gap: 10 (ref 5–15)
BUN: 41 mg/dL — ABNORMAL HIGH (ref 6–20)
CO2: 30 mmol/L (ref 22–32)
Calcium: 9.5 mg/dL (ref 8.9–10.3)
Chloride: 94 mmol/L — ABNORMAL LOW (ref 98–111)
Creatinine, Ser: 3.2 mg/dL — ABNORMAL HIGH (ref 0.44–1.00)
GFR, Estimated: 16 mL/min — ABNORMAL LOW (ref >60)
Glucose, Bld: 167 mg/dL — ABNORMAL HIGH (ref 70–99)
Phosphorus: 1.1 mg/dL — ABNORMAL LOW (ref 2.5–4.6)
Potassium: 3.6 mmol/L (ref 3.5–5.1)
Sodium: 134 mmol/L — ABNORMAL LOW (ref 135–145)

## 2021-02-01 LAB — CBC
HCT: 30.9 % — ABNORMAL LOW (ref 36.0–46.0)
Hemoglobin: 9.5 g/dL — ABNORMAL LOW (ref 12.0–15.0)
MCH: 27.1 pg (ref 26.0–34.0)
MCHC: 30.7 g/dL (ref 30.0–36.0)
MCV: 88 fL (ref 80.0–100.0)
Platelets: 173 10*3/uL (ref 150–400)
RBC: 3.51 MIL/uL — ABNORMAL LOW (ref 3.87–5.11)
RDW: 20.6 % — ABNORMAL HIGH (ref 11.5–15.5)
WBC: 9.3 10*3/uL (ref 4.0–10.5)
nRBC: 0 % (ref 0.0–0.2)

## 2021-02-01 LAB — GLUCOSE, CAPILLARY
Glucose-Capillary: 141 mg/dL — ABNORMAL HIGH (ref 70–99)
Glucose-Capillary: 83 mg/dL (ref 70–99)
Glucose-Capillary: 105 mg/dL — ABNORMAL HIGH (ref 70–99)
Glucose-Capillary: 142 mg/dL — ABNORMAL HIGH (ref 70–99)

## 2021-02-01 MED ORDER — HEPARIN SODIUM (PORCINE) 1000 UNIT/ML DIALYSIS
2000.0000 [IU] | Freq: Once | INTRAMUSCULAR | Status: AC
  Administered 2021-02-01: 2000 [IU] via INTRAVENOUS_CENTRAL

## 2021-02-01 MED ORDER — SODIUM CHLORIDE 0.9 % IV SOLN
100.0000 mL | INTRAVENOUS | Status: DC | PRN
Start: 2021-02-01 — End: 2021-02-01

## 2021-02-01 MED ORDER — LEVOTHYROXINE SODIUM 75 MCG PO TABS: 75.0000 ug | ORAL_TABLET | Freq: Every day | ORAL | 0 refills | Status: DC

## 2021-02-01 MED ORDER — LIDOCAINE-PRILOCAINE 2.5-2.5 % EX CREA
1.0000 "application " | TOPICAL_CREAM | CUTANEOUS | Status: DC | PRN
  Filled 2021-02-01: qty 5

## 2021-02-01 MED ORDER — CARVEDILOL 3.125 MG PO TABS: 3.1250 mg | ORAL_TABLET | Freq: Two times a day (BID) | ORAL | 0 refills | Status: DC

## 2021-02-01 MED ORDER — LIDOCAINE HCL (PF) 1 % IJ SOLN
5.0000 mL | INTRAMUSCULAR | Status: DC | PRN
  Filled 2021-02-01: qty 5

## 2021-02-01 MED ORDER — PENTAFLUOROPROP-TETRAFLUOROETH EX AERO: 1.0000 "application " | INHALATION_SPRAY | CUTANEOUS | Status: DC | PRN

## 2021-02-01 MED ORDER — DOXERCALCIFEROL 4 MCG/2ML IV SOLN: 2.0000 ug | INTRAVENOUS | Status: DC

## 2021-02-01 NOTE — Progress Notes (Signed)
PT Cancellation Note  Patient Details Name: VIDALIA OFLYNN MRN: [id number] DOB: [birth year]   Cancelled Treatment:    Reason Eval/Treat Not Completed: Patient at procedure or test/unavailable.  Pt will be re-attempted as time and pt allow.   Ramond Dial 02/01/2021, 12:56 PM  Mee Hives, PT MS Acute Rehab Dept. Number: [place]

## 2021-02-01 NOTE — Progress Notes (Signed)
OT Cancellation Note  Patient Details Name: [name] Michael MRN: [id number] DOB: Nov 05, 1960   Cancelled Treatment:    Reason Eval/Treat Not Completed: Patient at procedure or test/ unavailable (HD)  [name] 02/01/2021, 10:39 AM [name], OTR/L Acute Rehabilitation Services Pager: [phone] Office: [phone]

## 2021-02-01 NOTE — Progress Notes (Signed)
SLP Cancellation Note  Patient Details Name: [name] Michael MRN: [id number] DOB: 15-Aug-1960   Cancelled treatment:       Reason Eval/Treat Not Completed: Patient at procedure or test/unavailable (HD).    Osie Bond., M.A. [facility] Pager [phone] Office [phone]  02/01/2021, 11:24 AM

## 2021-02-01 NOTE — TOC Transition Note (Signed)
Transition of Care [facility]) - CM/SW Discharge Note   Patient Details  Name: [name] Michael MRN: [id number] Date of Birth: 22-Aug-1960  [facility] CM/SW Contact:  Coralee Pesa, [place] Phone Number: 02/01/2021, 2:25 PM   Clinical Narrative:    Pt to be transported to [facility] via [place]. Nurse to call report to [phone], ask for [facility] nurse.   Final next level of care: Skilled Nursing Facility Barriers to Discharge: Barriers Resolved   Patient Goals and CMS Choice Patient states their goals for this hospitalization and ongoing recovery are:: To recover at [facility] CMS Medicare.gov Compare Post Acute Care list provided to:: Patient Choice offered to / list presented to : Patient  Discharge Placement              Patient chooses bed at: [facility] Patient to be transferred to facility by: [place] Name of family member notified: Self Patient and family notified of of transfer: 02/01/21  Discharge Plan and Services In-house Referral: Clinical Social Work Discharge Planning Services: CM Consult Post Acute Care Choice: [place]                               Social Determinants of Health (SDOH) Interventions     Readmission Risk Interventions Readmission Risk Prevention Plan 01/31/2021 12/07/2019 04/29/2019  Transportation Screening Complete Complete Complete  PCP or Specialist Appt within 3-5 Days - Complete Complete  HRI or Home Care Consult - Complete Complete  Social Work Consult for [place] Planning/Counseling - Complete Complete  Palliative Care Screening - Not Applicable Not Applicable  Medication Review Press photographer) Referral to Pharmacy Referral to Pharmacy Referral to Pharmacy  PCP or Specialist appointment within 3-5 days of discharge Complete - -  HRI or Home Care Consult Complete - -  SW Recovery Care/Counseling Consult Complete - -  Palliative Care Screening Not Applicable - -  Skilled Nursing Facility Complete -  -  Some recent data might be hidden

## 2021-02-01 NOTE — Discharge Summary (Signed)
Physician Discharge Summary  [name] Michael [id number] DOB: Dec 04, 1960 DOA: 01/25/2021  PCP: Charlott Rakes, MD  Admit date: 01/25/2021 Discharge date: 02/01/2021  Admitted From: SNF Disposition: SNF  Recommendations for Outpatient Follow-up:  Follow up with PCP in 1-2 weeks Please obtain BMP/CBC in one week Continue goals of care conversations.  Follow with HD>     Discharge Condition: Stable.  CODE STATUS: DNR Diet recommendation: Dysphagia 2 Diet  Brief/Interim Summary:  60 year old old with past medical history significant for ESRD on hemodialysis, history of CVA, hypertension, diabetes type 2, chronic diastolic heart failure who presents to the ED from a skilled nursing facility complaining of altered mental status and abdominal distention, diagnosed with toxic encephalopathy with possible SBP and admitted to the hospital.  Subsequently SBP was ruled out.      1-Acute toxic encephalopathy due to ESBL E. coli bacteremia suspicious for source likely UTI SBO ruled out. Completed meropenem for 7 days.  -Blood culture (01/25/2021) grew E. coli ESBL. Paracentesis not suggestive of SBP. CT head no acute findings. Sepsis likely present on admission has resolved. Patient wants to continue with HD.  Discharge home today.  Encephalopathy improving.    2-ESRD with ascites, Acute hypoxic respiratory failure: fluid overload, ascites.  Dialysis TTS schedule. Use oxygen as needed at a skilled nursing facility. Nephrology assisting with hemodialysis.   3-Anemia of chronic disease Hb stable.    Chronic thrombocytopenia Stable.    Hypertension: Continue with Coreg   Hypophosphatemia: Replaced   Lactic  acidosis due to renal failure and ESRD, also sepsis on admission.   Severe protein caloric malnutrition with massive third spacing of fluid On protein supplements   Diabetes type 2: Continue with sliding scale insulin GI; loose stool. Started florastore. Hypothyroidism:  Change Synthroid to oral.  Goals of care; Patient wants to continue with HD.    Estimated body mass index is 21.26 kg/m as calculated from the following:   Height as of this encounter: '4\' 6"'$  (1.372 m).   Weight as of this encounter: 40 kg.     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 (HCC)   Hyperlipidemia   Essential hypertension   Depression   Hypokalemia   Chronic diastolic CHF (congestive heart failure) (HCC)   Anemia of chronic disease   ESRD (end stage renal disease) (HCC)   Lactic acidosis   Thrombocytopenia (HCC)   Acute respiratory failure with hypoxia (HCC)   Abdominal ascites    Discharge Instructions  Discharge Instructions     Diet - low sodium heart healthy   Complete by: As directed    Discharge wound care:   Complete by: As directed    As above   Increase activity slowly   Complete by: As directed       Allergies as of 02/01/2021       Reactions   Hydrocodone Nausea And Vomiting, Other (See Comments)   "Allergic," per [facility]        Medication List     STOP taking these medications    hydrocortisone 2.5 % rectal cream Commonly known as: ANUSOL-HC   lidocaine 5 % ointment Commonly known as: XYLOCAINE       TAKE these medications    aspirin 81 MG chewable tablet Chew 81 mg by mouth daily.   atorvastatin 40 MG tablet Commonly known as: LIPITOR Take 40 mg by mouth at bedtime.  b complex-vitamin c-folic acid 0.8 MG Tabs tablet Take 1 tablet by mouth at bedtime.   carvedilol 3.125 MG tablet Commonly known as: COREG Take 1 tablet (3.125 mg total) by mouth 2 (two) times daily with a meal. What changed:  medication strength how much to take   cloNIDine 0.1 MG tablet Commonly known as: CATAPRES Take 0.1 mg by mouth every 8 (eight) hours as needed (BP>160/90).   feeding supplement (PRO-STAT SUGAR FREE 64)  Liqd Take 30 mLs by mouth in the morning. WILD CHERRY FLAVOR   FLUoxetine 20 MG capsule Commonly known as: PROZAC Take 20 mg by mouth daily.   levothyroxine 75 MCG tablet Commonly known as: SYNTHROID Take 1 tablet (75 mcg total) by mouth daily at 6 (six) AM. Start taking on: February 02, 2021   midodrine 10 MG tablet Commonly known as: PROAMATINE Take 10 mg by mouth See admin instructions. Take 1 tablet (10 mg) by mouth prior to dialysis on Tuesday, Thursday, Saturday   NovaSource Renal Liqd Take 237 mLs by mouth with breakfast, with lunch, and with evening meal.   omega-3 acid ethyl esters 1 g capsule Commonly known as: LOVAZA Take 2 capsules by mouth 2 (two) times daily.   omeprazole 40 MG capsule Commonly known as: PRILOSEC Take 40 mg by mouth daily before breakfast.   ondansetron 4 MG tablet Commonly known as: ZOFRAN Take 4 mg by mouth every 8 (eight) hours as needed for nausea or vomiting.   polyethylene glycol powder 17 GM/SCOOP powder Commonly known as: GLYCOLAX/MIRALAX Take 17 g by mouth See admin instructions. Mix 17 grams of powder into 8 ounces of fluid, stir, and drink (by mouth) once a day   PREPARATION H RAPID RELIEF EX Apply topically every 6 (six) hours as needed (hemorrhoid).   SACCHAROMYCES BOULARDII PO Take 1 capsule by mouth 2 (two) times daily.   senna-docusate 8.6-50 MG tablet Commonly known as: Senokot-S Take 1 tablet by mouth at bedtime as needed for mild constipation.   sodium bicarbonate 650 MG tablet Take 1,300 mg by mouth 2 (two) times daily.               Discharge Care Instructions  (From admission, onward)           Start     Ordered   02/01/21 0000  Discharge wound care:       Comments: As above   02/01/21 1257            Contact information for after-discharge care     [facility] .   Service: Skilled Nursing Contact information: [place]  [phone]                    Allergies  Allergen Reactions   Hydrocodone Nausea And Vomiting and Other (See Comments)    "Allergic," per [facility]    Consultations: Nephrology ID phone consultation.    Procedures/Studies: CT Abdomen Pelvis Wo Contrast  Result Date: 01/25/2021 CLINICAL DATA:  Abdominal distension EXAM: CT CHEST, ABDOMEN AND PELVIS WITHOUT CONTRAST TECHNIQUE: Multidetector CT imaging of the chest, abdomen and pelvis was performed following the standard protocol without IV contrast. COMPARISON:  Pelvis radiograph, 10/13/2020. CT abdomen pelvis, 10/05/2020. FINDINGS: The examination is limited secondary to lack of intravenous contrast, and significant motion artifact-most pronounced at the level the chest. CT CHEST FINDINGS Cardiovascular: Cardiomegaly with 4-chamber cardiac enlargement. Multi vessel coronary vascular calcifications. Mediastinum/Nodes: No  enlarged mediastinal, hilar, or axillary lymph nodes. Thyroid gland, trachea, and esophagus demonstrate no significant findings. Lungs/Pleura: Small volume right pleural effusion, with adjacent dependent atelectasis. Musculoskeletal: Healing left fifth anterior rib fracture CT ABDOMEN PELVIS FINDINGS Hepatobiliary: No focal liver abnormality is seen. No gallstones, gallbladder wall thickening, or biliary dilatation. Pancreas: Atrophic.  No pancreatic duct dilation. Spleen: Normal in size without focal abnormality. Adrenals/Urinary Tract: Adrenal glands are unremarkable. Perirenal stranding. Punctate nonobstructing nephroliths within the left renal collecting system. No hydronephrosis. Bladder is unremarkable. Stomach/Bowel: Stomach is within normal limits. Appendix appears normal. No evidence of bowel wall thickening, distention, or inflammatory changes. Vascular/Lymphatic: Aortic atherosclerosis. No aneurysmal dilation. No enlarged abdominal or pelvic lymph nodes. Reproductive: Uterus and bilateral adnexa are  unremarkable. Other: Moderate volume of intra-abdominal ascites, as seen along the dependent pelvis, pericolic gutters, perihepatic and perisplenic spaces. Musculoskeletal: Diffuse subcutaneous edema. No acute osseous abnormality. IMPRESSION: Suboptimal evaluation, within this constraint; 1. No acute thoracic or abdominopelvic findings. 2. Cardiomegaly. 3. Small volume right pleural effusion with adjacent basilar consolidation, likely atelectasis though early pneumonia can appear similar. 4. Nonobstructing left nephrolithiasis.  No hydronephrosis. 5. Moderate volume intra-abdominal ascites. 6. Addition and senescent findings, as above. Aortic Atherosclerosis (ICD10-I70.0). Electronically Signed   By: Michaelle Birks MD   On: 01/25/2021 12:11   DG Chest 1 View  Result Date: 01/22/2021 CLINICAL DATA:  Fall hypoxia EXAM: CHEST  1 VIEW COMPARISON:  10/13/2020 FINDINGS: Cardiomegaly with aortic atherosclerosis. No focal opacity or pleural effusion. No pneumothorax. IMPRESSION: No active disease.  Cardiomegaly Electronically Signed   By: Donavan Foil M.D.   On: 01/22/2021 18:57   DG Thoracic Spine 2 View  Result Date: 01/22/2021 CLINICAL DATA:  Fall EXAM: THORACIC SPINE 2 VIEWS COMPARISON:  Thoracic spine radiograph 02/29/2020 FINDINGS: There is no radiographically evident thoracic spine fracture. Mild multilevel degenerative changes. IMPRESSION: No radiographically evident thoracic spine fracture. Mild multilevel degenerative changes. Electronically Signed   By: Maurine Simmering   On: 01/22/2021 18:58   CT Head Wo Contrast  Result Date: 01/25/2021 CLINICAL DATA:  Mental status change, unknown cause EXAM: CT HEAD WITHOUT CONTRAST TECHNIQUE: Contiguous axial images were obtained from the base of the skull through the vertex without intravenous contrast. COMPARISON:  CT October 13, 2020. FINDINGS: Motion limited study.  Within this limitation: Brain: No evidence of acute large vascular territory infarction, hemorrhage,  hydrocephalus, extra-axial collection or mass lesion/mass effect. Similar remote lacunar infarcts in the bilateral basal ganglia and thalami. Similar additional patchy white matter hypoattenuation, likely related to chronic microvascular ischemic disease. Vascular: No hyperdense vessel identified. Skull: No acute fracture. Sinuses/Orbits: Visualized sinuses are clear. Similar exophthalmos without acute orbital finding. Other: No sizable mastoid effusions. IMPRESSION: 1. No evidence of acute intracranial abnormality on this motion study. 2. Similar remote lacunar infarcts in bilateral basal ganglia and thalami and chronic microvascular ischemic disease. Electronically Signed   By: Margaretha Sheffield MD   On: 01/25/2021 14:20   CT Chest Wo Contrast  Result Date: 01/25/2021 CLINICAL DATA:  Abdominal distension EXAM: CT CHEST, ABDOMEN AND PELVIS WITHOUT CONTRAST TECHNIQUE: Multidetector CT imaging of the chest, abdomen and pelvis was performed following the standard protocol without IV contrast. COMPARISON:  Pelvis radiograph, 10/13/2020. CT abdomen pelvis, 10/05/2020. FINDINGS: The examination is limited secondary to lack of intravenous contrast, and significant motion artifact-most pronounced at the level the chest. CT CHEST FINDINGS Cardiovascular: Cardiomegaly with 4-chamber cardiac enlargement. Multi vessel coronary vascular calcifications. Mediastinum/Nodes: No enlarged mediastinal, hilar, or axillary lymph  nodes. Thyroid gland, trachea, and esophagus demonstrate no significant findings. Lungs/Pleura: Small volume right pleural effusion, with adjacent dependent atelectasis. Musculoskeletal: Healing left fifth anterior rib fracture CT ABDOMEN PELVIS FINDINGS Hepatobiliary: No focal liver abnormality is seen. No gallstones, gallbladder wall thickening, or biliary dilatation. Pancreas: Atrophic.  No pancreatic duct dilation. Spleen: Normal in size without focal abnormality. Adrenals/Urinary Tract: Adrenal glands  are unremarkable. Perirenal stranding. Punctate nonobstructing nephroliths within the left renal collecting system. No hydronephrosis. Bladder is unremarkable. Stomach/Bowel: Stomach is within normal limits. Appendix appears normal. No evidence of bowel wall thickening, distention, or inflammatory changes. Vascular/Lymphatic: Aortic atherosclerosis. No aneurysmal dilation. No enlarged abdominal or pelvic lymph nodes. Reproductive: Uterus and bilateral adnexa are unremarkable. Other: Moderate volume of intra-abdominal ascites, as seen along the dependent pelvis, pericolic gutters, perihepatic and perisplenic spaces. Musculoskeletal: Diffuse subcutaneous edema. No acute osseous abnormality. IMPRESSION: Suboptimal evaluation, within this constraint; 1. No acute thoracic or abdominopelvic findings. 2. Cardiomegaly. 3. Small volume right pleural effusion with adjacent basilar consolidation, likely atelectasis though early pneumonia can appear similar. 4. Nonobstructing left nephrolithiasis.  No hydronephrosis. 5. Moderate volume intra-abdominal ascites. 6. Addition and senescent findings, as above. Aortic Atherosclerosis (ICD10-I70.0). Electronically Signed   By: Michaelle Birks MD   On: 01/25/2021 12:11   US Paracentesis  Result Date: 01/26/2021 INDICATION: History of ESRD, ascites seen on previous ultrasound study. Request for therapeutic and diagnostic paracentesis. EXAM: ULTRASOUND GUIDED  PARACENTESIS MEDICATIONS: 10 mL 1% lidocaine COMPLICATIONS: None immediate. PROCEDURE: Informed written consent was obtained from the patient after a discussion of the risks, benefits and alternatives to treatment. A timeout was performed prior to the initiation of the procedure. Initial ultrasound scanning demonstrates a large amount of ascites within the right lower abdominal quadrant. The right lower abdomen was prepped and draped in the usual sterile fashion. 1% lidocaine was used for local anesthesia. Following this, a 19  gauge, 7-cm, Yueh catheter was introduced. An ultrasound image was saved for documentation purposes. The paracentesis was performed. The catheter was removed and a dressing was applied. The patient tolerated the procedure well without immediate post procedural complication. FINDINGS: A total of approximately 1.1 L of hazy blood-tinged fluid was removed. Samples were sent to the laboratory as requested by the clinical team. IMPRESSION: Successful ultrasound-guided paracentesis yielding 1.1 liters of peritoneal fluid. Read by: Durenda Guthrie, PA-C Electronically Signed   By: Ruthann Cancer MD   On: 01/26/2021 13:52   DG Chest Port 1 View  Result Date: 01/25/2021 CLINICAL DATA:  Questionable sepsis EXAM: PORTABLE CHEST 1 VIEW COMPARISON:  Three days ago FINDINGS: Chronic cardiomegaly. Low volume chest. There is no edema, consolidation, effusion, or pneumothorax. Mild atelectasis or scarring in the left mid chest. Extensive artifact from EKG leads. IMPRESSION: No focal pneumonia. Cardiomegaly. Electronically Signed   By: Monte Fantasia M.D.   On: 01/25/2021 09:24   [name] ASCITES (ABDOMEN LIMITED)  Result Date: 01/25/2021 CLINICAL DATA:  Assess for ascites EXAM: LIMITED ABDOMEN ULTRASOUND FOR ASCITES TECHNIQUE: Limited ultrasound survey for ascites was performed in all four abdominal quadrants. COMPARISON:  CT 01/25/2021 FINDINGS: Targeted sonography of the 4 quadrants is performed. Moderate ascites is visualized with largest pocket in the right lower quadrant. IMPRESSION: Moderate ascites Electronically Signed   By: Donavan Foil M.D.   On: 01/25/2021 17:53    Subjective: She is alert, no new complaint. Seen in HD  Discharge Exam: Vitals:   02/01/21 1100 02/01/21 1130  BP: (!) 145/66 (!) 147/68  Pulse: 79 83  Resp: 19 (!) 21  Temp:    SpO2:       General: Pt is alert, awake, not in acute distress Cardiovascular: RRR, S1/S2 +, no rubs, no gallops Respiratory: CTA bilaterally, no wheezing, no  rhonchi Abdominal: Soft, NT, ND, bowel sounds + Extremities: no edema, no cyanosis    The results of significant diagnostics from this hospitalization (including imaging, microbiology, ancillary and laboratory) are listed below for reference.     Microbiology: Recent Results (from the past 240 hour(s))  Blood culture (routine single)     Status: Abnormal   Collection Time: 01/25/21  9:19 AM   Specimen: BLOOD  Result Value Ref Range Status   Specimen Description BLOOD RIGHT ANTECUBITAL  Final   Special Requests   Final    BOTTLES DRAWN AEROBIC AND ANAEROBIC Blood Culture results may not be optimal due to an inadequate volume of blood received in culture bottles   Culture  Setup Time   Final    GRAM NEGATIVE RODS IN BOTH AEROBIC AND ANAEROBIC BOTTLES CRITICAL RESULT CALLED TO, READ BACK BY AND VERIFIED WITH: Cottie Banda [id number] 2141 FCP Performed at [facility], [place] [address] [place], [place] [address]    Culture (A)  Final    ESCHERICHIA COLI Confirmed Extended Spectrum Beta-Lactamase Producer (ESBL).  In bloodstream infections from ESBL organisms, carbapenems are preferred over piperacillin/tazobactam. They are shown to have a lower risk of mortality.    Report Status 01/27/2021 FINAL  Final   Organism ID, Bacteria ESCHERICHIA COLI  Final      Susceptibility   Escherichia coli - MIC*    AMPICILLIN >=32 RESISTANT Resistant     CEFAZOLIN >=64 RESISTANT Resistant     CEFEPIME 2 SENSITIVE Sensitive     CEFTAZIDIME RESISTANT Resistant     CEFTRIAXONE >=64 RESISTANT Resistant     CIPROFLOXACIN 0.5 SENSITIVE Sensitive     GENTAMICIN >=16 RESISTANT Resistant     IMIPENEM <=0.25 SENSITIVE Sensitive     TRIMETH/SULFA >=320 RESISTANT Resistant     AMPICILLIN/SULBACTAM >=32 RESISTANT Resistant     PIP/TAZO <=4 SENSITIVE Sensitive     * ESCHERICHIA COLI  Blood Culture ID Panel (Reflexed)     Status: Abnormal   Collection Time: 01/25/21  9:19 AM  Result Value Ref Range  Status   Enterococcus faecalis NOT DETECTED NOT DETECTED Final   Enterococcus Faecium NOT DETECTED NOT DETECTED Final   Listeria monocytogenes NOT DETECTED NOT DETECTED Final   Staphylococcus species NOT DETECTED NOT DETECTED Final   Staphylococcus aureus (BCID) NOT DETECTED NOT DETECTED Final   Staphylococcus epidermidis NOT DETECTED NOT DETECTED Final   Staphylococcus lugdunensis NOT DETECTED NOT DETECTED Final   Streptococcus species NOT DETECTED NOT DETECTED Final   Streptococcus agalactiae NOT DETECTED NOT DETECTED Final   Streptococcus pneumoniae NOT DETECTED NOT DETECTED Final   Streptococcus pyogenes NOT DETECTED NOT DETECTED Final   A.calcoaceticus-baumannii NOT DETECTED NOT DETECTED Final   Bacteroides fragilis NOT DETECTED NOT DETECTED Final   Enterobacterales DETECTED (A) NOT DETECTED Final    Comment: Enterobacterales represent a large order of gram negative bacteria, not a single organism. CRITICAL RESULT CALLED TO, READ BACK BY AND VERIFIED WITH: PHARMD L SEAY [id number] 2141 FCP    Enterobacter cloacae complex NOT DETECTED NOT DETECTED Final   Escherichia coli DETECTED (A) NOT DETECTED Final    Comment: CRITICAL RESULT CALLED TO, READ BACK BY AND VERIFIED WITH: PHARMD L SEAY [id number] 2141 FCP    Klebsiella  aerogenes NOT DETECTED NOT DETECTED Final   Klebsiella oxytoca NOT DETECTED NOT DETECTED Final   Klebsiella pneumoniae NOT DETECTED NOT DETECTED Final   Proteus species NOT DETECTED NOT DETECTED Final   Salmonella species NOT DETECTED NOT DETECTED Final   Serratia marcescens NOT DETECTED NOT DETECTED Final   Haemophilus influenzae NOT DETECTED NOT DETECTED Final   Neisseria meningitidis NOT DETECTED NOT DETECTED Final   Pseudomonas aeruginosa NOT DETECTED NOT DETECTED Final   Stenotrophomonas maltophilia NOT DETECTED NOT DETECTED Final   Candida albicans NOT DETECTED NOT DETECTED Final   Candida auris NOT DETECTED NOT DETECTED Final   Candida glabrata NOT DETECTED NOT  DETECTED Final   Candida krusei NOT DETECTED NOT DETECTED Final   Candida parapsilosis NOT DETECTED NOT DETECTED Final   Candida tropicalis NOT DETECTED NOT DETECTED Final   Cryptococcus neoformans/gattii NOT DETECTED NOT DETECTED Final   CTX-M ESBL DETECTED (A) NOT DETECTED Final    Comment: CRITICAL RESULT CALLED TO, READ BACK BY AND VERIFIED WITH: PHARMD L SEAY [id number] 2141 FCP (NOTE) Extended spectrum beta-lactamase detected. Recommend a carbapenem as initial therapy.      Carbapenem resistance IMP NOT DETECTED NOT DETECTED Final   Carbapenem resistance KPC NOT DETECTED NOT DETECTED Final   Carbapenem resistance NDM NOT DETECTED NOT DETECTED Final   Carbapenem resist OXA 48 LIKE NOT DETECTED NOT DETECTED Final   Carbapenem resistance VIM NOT DETECTED NOT DETECTED Final    Comment: Performed at [facility], [place] [address] [place], [place] [address]  Culture, blood (single)     Status: Abnormal   Collection Time: 01/25/21 10:35 AM   Specimen: BLOOD RIGHT HAND  Result Value Ref Range Status   Specimen Description BLOOD RIGHT HAND  Final   Special Requests   Final    BOTTLES DRAWN AEROBIC ONLY Blood Culture results may not be optimal due to an inadequate volume of blood received in culture bottles   Culture  Setup Time   Final    GRAM NEGATIVE RODS AEROBIC BOTTLE ONLY CRITICAL VALUE NOTED.  VALUE IS CONSISTENT WITH PREVIOUSLY REPORTED AND CALLED VALUE.    Culture (A)  Final    ESCHERICHIA COLI SUSCEPTIBILITIES PERFORMED ON PREVIOUS CULTURE WITHIN THE LAST 5 DAYS. Performed at [facility], [place] [address], [place], [place] [address]    Report Status 01/27/2021 FINAL  Final  Urine Culture     Status: Abnormal   Collection Time: 01/25/21  1:19 PM   Specimen: In/Out Cath Urine  Result Value Ref Range Status   Specimen Description IN/OUT CATH URINE  Final   Special Requests   Final    NONE Performed at [facility], [place] [address] [place], [place]  [address]    Culture (A)  Final    >=100,000 COLONIES/mL ESCHERICHIA COLI Confirmed Extended Spectrum Beta-Lactamase Producer (ESBL).  In bloodstream infections from ESBL organisms, carbapenems are preferred over piperacillin/tazobactam. They are shown to have a lower risk of mortality.    Report Status 01/27/2021 FINAL  Final   Organism ID, Bacteria ESCHERICHIA COLI (A)  Final      Susceptibility   Escherichia coli - MIC*    AMPICILLIN >=32 RESISTANT Resistant     CEFAZOLIN >=64 RESISTANT Resistant     CEFEPIME 4 INTERMEDIATE Intermediate     CEFTRIAXONE >=64 RESISTANT Resistant     CIPROFLOXACIN 0.5 SENSITIVE Sensitive     GENTAMICIN >=16 RESISTANT Resistant     IMIPENEM <=0.25 SENSITIVE Sensitive  NITROFURANTOIN <=16 SENSITIVE Sensitive     TRIMETH/SULFA >=320 RESISTANT Resistant     AMPICILLIN/SULBACTAM >=32 RESISTANT Resistant     PIP/TAZO <=4 SENSITIVE Sensitive     * >=100,000 COLONIES/mL ESCHERICHIA COLI  SARS CORONAVIRUS 2 (TAT 6-24 HRS) Nasopharyngeal Nasopharyngeal Swab     Status: None   Collection Time: 01/25/21  4:06 PM   Specimen: Nasopharyngeal Swab  Result Value Ref Range Status   SARS Coronavirus 2 NEGATIVE NEGATIVE Final    Comment: (NOTE) SARS-CoV-2 target nucleic acids are NOT DETECTED.  The SARS-CoV-2 RNA is generally detectable in upper and lower respiratory specimens during the acute phase of infection. Negative results do not preclude SARS-CoV-2 infection, do not rule out co-infections with other pathogens, and should not be used as the sole basis for treatment or other patient management decisions. Negative results must be combined with clinical observations, patient history, and epidemiological information. The expected result is Negative.  Fact Sheet for Patients: SugarRoll.be  Fact Sheet for Healthcare Providers: https://www.woods-mathews.com/  This test is not yet approved or cleared by the [place]  FDA and  has been authorized for detection and/or diagnosis of SARS-CoV-2 by FDA under an Emergency Use Authorization (EUA). This EUA will remain  in effect (meaning this test can be used) for the duration of the COVID-19 declaration under Se ction 564(b)(1) of the Act, 21 U.S.C. section 360bbb-3(b)(1), unless the authorization is terminated or revoked sooner.  Performed at [facility], [place] [address], [place], [place] [address]   MRSA Next Gen by PCR, Nasal     Status: None   Collection Time: 01/26/21  7:27 AM   Specimen: Nasal Mucosa; Nasal Swab  Result Value Ref Range Status   MRSA by PCR Next Gen NOT DETECTED NOT DETECTED Final    Comment: (NOTE) The GeneXpert MRSA Assay (FDA approved for NASAL specimens only), is one component of a comprehensive MRSA colonization surveillance program. It is not intended to diagnose MRSA infection nor to guide or monitor treatment for MRSA infections. Test performance is not FDA approved in patients less than 39 years old. Performed at [facility], [place] [address] [place], [place] [address]   Aerobic/Anaerobic Culture w Gram Stain (surgical/deep wound)     Status: None   Collection Time: 01/26/21  1:54 PM   Specimen: PATH Cytology Peritoneal fluid  Result Value Ref Range Status   Specimen Description PERITONEAL FLUID  Final   Special Requests NONE  Final   Gram Stain   Final    WBC PRESENT, PREDOMINANTLY MONONUCLEAR NO ORGANISMS SEEN CYTOSPIN SMEAR    Culture   Final    No growth aerobically or anaerobically. Performed at [facility], [place] [address] [place],  [address]    Report Status 01/31/2021 FINAL  Final  SARS CORONAVIRUS 2 (TAT 6-24 HRS) Nasopharyngeal Nasopharyngeal Swab     Status: None   Collection Time: 01/31/21  4:00 PM   Specimen: Nasopharyngeal Swab  Result Value Ref Range Status   SARS Coronavirus 2 NEGATIVE NEGATIVE Final    Comment: (NOTE) SARS-CoV-2 target nucleic acids are NOT  DETECTED.  The SARS-CoV-2 RNA is generally detectable in upper and lower respiratory specimens during the acute phase of infection. Negative results do not preclude SARS-CoV-2 infection, do not rule out co-infections with other pathogens, and should not be used as the sole basis for treatment or other patient management decisions. Negative results must be combined with clinical observations, patient history, and epidemiological  information. The expected result is Negative.  Fact Sheet for Patients: SugarRoll.be  Fact Sheet for Healthcare Providers: https://www.woods-mathews.com/  This test is not yet approved or cleared by the [place] FDA and  has been authorized for detection and/or diagnosis of SARS-CoV-2 by FDA under an Emergency Use Authorization (EUA). This EUA will remain  in effect (meaning this test can be used) for the duration of the COVID-19 declaration under Se ction 564(b)(1) of the Act, 21 U.S.C. section 360bbb-3(b)(1), unless the authorization is terminated or revoked sooner.  Performed at [facility], [place] [address], [place], [place] [address]      Labs: BNP (last 3 results) No results for input(s): BNP in the last 8760 hours. Basic Metabolic Panel: Recent Labs  Lab 01/25/21 2114 01/26/21 0136 01/28/21 [id number] 01/29/21 0102 01/29/21 0611 01/30/21 0215 02/01/21 0835  NA 139   < > 138 137 136 135 134*  K 3.1*   < > 3.2* 3.1* 3.3* 3.9 3.6  CL 98   < > 99 98 98 97* 94*  CO2 27   < > '30 30 31 '$ 32 30  GLUCOSE 105*   < > 148* 161* 185* 116* 167*  BUN 13   < > 21* 28* 30* 12 41*  CREATININE 2.18*   < > 2.59* 3.07* 3.09* 1.77* 3.20*  CALCIUM 8.9   < > 9.3 9.7 9.7 8.4* 9.5  MG 1.7  --   --   --   --   --   --   PHOS 2.3*  --   --   --  1.4*  --  1.1*   < > = values in this interval not displayed.   Liver Function Tests: Recent Labs  Lab 01/26/21 0136 01/27/21 [id number] 01/28/21 0828 01/29/21 0102  01/29/21 0611 01/30/21 0215 02/01/21 0835  AST '26 22 19 26  '$ --  25  --   ALT '15 15 14 15  '$ --  15  --   ALKPHOS 66 63 73 79  --  79  --   BILITOT 1.5* 1.3* 1.4* 1.0  --  1.2  --   PROT 5.4* 5.0* 5.5* 5.4*  --  5.5*  --   ALBUMIN 2.4* 2.1* 2.3* 2.2* 2.1* 2.2* 2.4*   No results for input(s): LIPASE, AMYLASE in the last 168 hours. No results for input(s): AMMONIA in the last 168 hours. CBC: Recent Labs  Lab 01/25/21 1524 01/26/21 0136 01/27/21 0616 01/28/21 0828 01/29/21 0102 01/29/21 0611 01/30/21 0215 02/01/21 0836  WBC 8.5   < > 9.3 9.3 6.0 5.6 5.4 9.3  NEUTROABS 7.9*  --  8.4* 8.4* 5.0  --  4.0  --   HGB 9.8*   < > 8.9* 9.5* 9.2* 9.1* 9.4* 9.5*  HCT 33.9*   < > 29.4* 31.1* 30.0* 30.8* 30.7* 30.9*  MCV 96.0   < > 91.6 90.7 90.9 91.4 89.8 88.0  PLT 88*   < > 79* 94* 88* 86* 96* 173   < > = values in this interval not displayed.   Cardiac Enzymes: No results for input(s): CKTOTAL, CKMB, CKMBINDEX, TROPONINI in the last 168 hours. BNP: Invalid input(s): POCBNP CBG: Recent Labs  Lab 01/31/21 0736 01/31/21 1147 01/31/21 1630 01/31/21 2232 02/01/21 0725  GLUCAP 194* 176* 142* 183* 141*   D-Dimer No results for input(s): DDIMER in the last 72 hours. Hgb A1c No results for input(s): HGBA1C in the last 72 hours. Lipid Profile No results for input(s): CHOL,  HDL, LDLCALC, TRIG, CHOLHDL, LDLDIRECT in the last 72 hours. Thyroid function studies No results for input(s): TSH, T4TOTAL, T3FREE, THYROIDAB in the last 72 hours.  Invalid input(s): FREET3 Anemia work up No results for input(s): VITAMINB12, FOLATE, FERRITIN, TIBC, IRON, RETICCTPCT in the last 72 hours. Urinalysis    Component Value Date/Time   COLORURINE AMBER (A) 01/25/2021 1319   APPEARANCEUR TURBID (A) 01/25/2021 1319   LABSPEC 1.013 01/25/2021 1319   PHURINE 7.0 01/25/2021 1319   GLUCOSEU NEGATIVE 01/25/2021 1319   HGBUR MODERATE (A) 01/25/2021 1319   BILIRUBINUR NEGATIVE 01/25/2021 1319   BILIRUBINUR  n 05/25/2012 1202   KETONESUR NEGATIVE 01/25/2021 1319   PROTEINUR >=300 (A) 01/25/2021 1319   UROBILINOGEN 0.2 05/25/2012 1202   UROBILINOGEN 0.2 03/29/2011 0131   NITRITE NEGATIVE 01/25/2021 1319   LEUKOCYTESUR LARGE (A) 01/25/2021 1319   Sepsis Labs Invalid input(s): PROCALCITONIN,  WBC,  LACTICIDVEN Microbiology Recent Results (from the past 240 hour(s))  Blood culture (routine single)     Status: Abnormal   Collection Time: 01/25/21  9:19 AM   Specimen: BLOOD  Result Value Ref Range Status   Specimen Description BLOOD RIGHT ANTECUBITAL  Final   Special Requests   Final    BOTTLES DRAWN AEROBIC AND ANAEROBIC Blood Culture results may not be optimal due to an inadequate volume of blood received in culture bottles   Culture  Setup Time   Final    GRAM NEGATIVE RODS IN BOTH AEROBIC AND ANAEROBIC BOTTLES CRITICAL RESULT CALLED TO, READ BACK BY AND VERIFIED WITH: Cottie Banda [id number] 2141 FCP Performed at [facility], [place] [address], [place], [place] [address]    Culture (A)  Final    ESCHERICHIA COLI Confirmed Extended Spectrum Beta-Lactamase Producer (ESBL).  In bloodstream infections from ESBL organisms, carbapenems are preferred over piperacillin/tazobactam. They are shown to have a lower risk of mortality.    Report Status 01/27/2021 FINAL  Final   Organism ID, Bacteria ESCHERICHIA COLI  Final      Susceptibility   Escherichia coli - MIC*    AMPICILLIN >=32 RESISTANT Resistant     CEFAZOLIN >=64 RESISTANT Resistant     CEFEPIME 2 SENSITIVE Sensitive     CEFTAZIDIME RESISTANT Resistant     CEFTRIAXONE >=64 RESISTANT Resistant     CIPROFLOXACIN 0.5 SENSITIVE Sensitive     GENTAMICIN >=16 RESISTANT Resistant     IMIPENEM <=0.25 SENSITIVE Sensitive     TRIMETH/SULFA >=320 RESISTANT Resistant     AMPICILLIN/SULBACTAM >=32 RESISTANT Resistant     PIP/TAZO <=4 SENSITIVE Sensitive     * ESCHERICHIA COLI  Blood Culture ID Panel (Reflexed)     Status: Abnormal    Collection Time: 01/25/21  9:19 AM  Result Value Ref Range Status   Enterococcus faecalis NOT DETECTED NOT DETECTED Final   Enterococcus Faecium NOT DETECTED NOT DETECTED Final   Listeria monocytogenes NOT DETECTED NOT DETECTED Final   Staphylococcus species NOT DETECTED NOT DETECTED Final   Staphylococcus aureus (BCID) NOT DETECTED NOT DETECTED Final   Staphylococcus epidermidis NOT DETECTED NOT DETECTED Final   Staphylococcus lugdunensis NOT DETECTED NOT DETECTED Final   Streptococcus species NOT DETECTED NOT DETECTED Final   Streptococcus agalactiae NOT DETECTED NOT DETECTED Final   Streptococcus pneumoniae NOT DETECTED NOT DETECTED Final   Streptococcus pyogenes NOT DETECTED NOT DETECTED Final   A.calcoaceticus-baumannii NOT DETECTED NOT DETECTED Final   Bacteroides fragilis NOT DETECTED NOT DETECTED Final   Enterobacterales DETECTED (A) NOT DETECTED Final  Comment: Enterobacterales represent a large order of gram negative bacteria, not a single organism. CRITICAL RESULT CALLED TO, READ BACK BY AND VERIFIED WITH: PHARMD L SEAY [id number] 2141 FCP    Enterobacter cloacae complex NOT DETECTED NOT DETECTED Final   Escherichia coli DETECTED (A) NOT DETECTED Final    Comment: CRITICAL RESULT CALLED TO, READ BACK BY AND VERIFIED WITH: PHARMD L SEAY [id number] 2141 FCP    Klebsiella aerogenes NOT DETECTED NOT DETECTED Final   Klebsiella oxytoca NOT DETECTED NOT DETECTED Final   Klebsiella pneumoniae NOT DETECTED NOT DETECTED Final   Proteus species NOT DETECTED NOT DETECTED Final   Salmonella species NOT DETECTED NOT DETECTED Final   Serratia marcescens NOT DETECTED NOT DETECTED Final   Haemophilus influenzae NOT DETECTED NOT DETECTED Final   Neisseria meningitidis NOT DETECTED NOT DETECTED Final   Pseudomonas aeruginosa NOT DETECTED NOT DETECTED Final   Stenotrophomonas maltophilia NOT DETECTED NOT DETECTED Final   Candida albicans NOT DETECTED NOT DETECTED Final   Candida auris NOT  DETECTED NOT DETECTED Final   Candida glabrata NOT DETECTED NOT DETECTED Final   Candida krusei NOT DETECTED NOT DETECTED Final   Candida parapsilosis NOT DETECTED NOT DETECTED Final   Candida tropicalis NOT DETECTED NOT DETECTED Final   Cryptococcus neoformans/gattii NOT DETECTED NOT DETECTED Final   CTX-M ESBL DETECTED (A) NOT DETECTED Final    Comment: CRITICAL RESULT CALLED TO, READ BACK BY AND VERIFIED WITH: PHARMD L SEAY [id number] 2141 FCP (NOTE) Extended spectrum beta-lactamase detected. Recommend a carbapenem as initial therapy.      Carbapenem resistance IMP NOT DETECTED NOT DETECTED Final   Carbapenem resistance KPC NOT DETECTED NOT DETECTED Final   Carbapenem resistance NDM NOT DETECTED NOT DETECTED Final   Carbapenem resist OXA 48 LIKE NOT DETECTED NOT DETECTED Final   Carbapenem resistance VIM NOT DETECTED NOT DETECTED Final    Comment: Performed at [facility], [place] [address], [place], [place] [address]  Culture, blood (single)     Status: Abnormal   Collection Time: 01/25/21 10:35 AM   Specimen: BLOOD RIGHT HAND  Result Value Ref Range Status   Specimen Description BLOOD RIGHT HAND  Final   Special Requests   Final    BOTTLES DRAWN AEROBIC ONLY Blood Culture results may not be optimal due to an inadequate volume of blood received in culture bottles   Culture  Setup Time   Final    GRAM NEGATIVE RODS AEROBIC BOTTLE ONLY CRITICAL VALUE NOTED.  VALUE IS CONSISTENT WITH PREVIOUSLY REPORTED AND CALLED VALUE.    Culture (A)  Final    ESCHERICHIA COLI SUSCEPTIBILITIES PERFORMED ON PREVIOUS CULTURE WITHIN THE LAST 5 DAYS. Performed at [facility], [place] [address] [place], [place] [address]    Report Status 01/27/2021 FINAL  Final  Urine Culture     Status: Abnormal   Collection Time: 01/25/21  1:19 PM   Specimen: In/Out Cath Urine  Result Value Ref Range Status   Specimen Description IN/OUT CATH URINE  Final   Special Requests   Final    NONE Performed  at [facility], [place] [address], [place], [place] [address]    Culture (A)  Final    >=100,000 COLONIES/mL ESCHERICHIA COLI Confirmed Extended Spectrum Beta-Lactamase Producer (ESBL).  In bloodstream infections from ESBL organisms, carbapenems are preferred over piperacillin/tazobactam. They are shown to have a lower risk of mortality.    Report Status 01/27/2021 FINAL  Final   Organism ID,  Bacteria ESCHERICHIA COLI (A)  Final      Susceptibility   Escherichia coli - MIC*    AMPICILLIN >=32 RESISTANT Resistant     CEFAZOLIN >=64 RESISTANT Resistant     CEFEPIME 4 INTERMEDIATE Intermediate     CEFTRIAXONE >=64 RESISTANT Resistant     CIPROFLOXACIN 0.5 SENSITIVE Sensitive     GENTAMICIN >=16 RESISTANT Resistant     IMIPENEM <=0.25 SENSITIVE Sensitive     NITROFURANTOIN <=16 SENSITIVE Sensitive     TRIMETH/SULFA >=320 RESISTANT Resistant     AMPICILLIN/SULBACTAM >=32 RESISTANT Resistant     PIP/TAZO <=4 SENSITIVE Sensitive     * >=100,000 COLONIES/mL ESCHERICHIA COLI  SARS CORONAVIRUS 2 (TAT 6-24 HRS) Nasopharyngeal Nasopharyngeal Swab     Status: None   Collection Time: 01/25/21  4:06 PM   Specimen: Nasopharyngeal Swab  Result Value Ref Range Status   SARS Coronavirus 2 NEGATIVE NEGATIVE Final    Comment: (NOTE) SARS-CoV-2 target nucleic acids are NOT DETECTED.  The SARS-CoV-2 RNA is generally detectable in upper and lower respiratory specimens during the acute phase of infection. Negative results do not preclude SARS-CoV-2 infection, do not rule out co-infections with other pathogens, and should not be used as the sole basis for treatment or other patient management decisions. Negative results must be combined with clinical observations, patient history, and epidemiological information. The expected result is Negative.  Fact Sheet for Patients: SugarRoll.be  Fact Sheet for Healthcare  Providers: https://www.woods-mathews.com/  This test is not yet approved or cleared by the [place] FDA and  has been authorized for detection and/or diagnosis of SARS-CoV-2 by FDA under an Emergency Use Authorization (EUA). This EUA will remain  in effect (meaning this test can be used) for the duration of the COVID-19 declaration under Se ction 564(b)(1) of the Act, 21 U.S.C. section 360bbb-3(b)(1), unless the authorization is terminated or revoked sooner.  Performed at [facility], [place] [address], [place], [place] [address]   MRSA Next Gen by PCR, Nasal     Status: None   Collection Time: 01/26/21  7:27 AM   Specimen: Nasal Mucosa; Nasal Swab  Result Value Ref Range Status   MRSA by PCR Next Gen NOT DETECTED NOT DETECTED Final    Comment: (NOTE) The GeneXpert MRSA Assay (FDA approved for NASAL specimens only), is one component of a comprehensive MRSA colonization surveillance program. It is not intended to diagnose MRSA infection nor to guide or monitor treatment for MRSA infections. Test performance is not FDA approved in patients less than 38 years old. Performed at [facility], [place] [address], [place], [place] [address]   Aerobic/Anaerobic Culture w Gram Stain (surgical/deep wound)     Status: None   Collection Time: 01/26/21  1:54 PM   Specimen: PATH Cytology Peritoneal fluid  Result Value Ref Range Status   Specimen Description PERITONEAL FLUID  Final   Special Requests NONE  Final   Gram Stain   Final    WBC PRESENT, PREDOMINANTLY MONONUCLEAR NO ORGANISMS SEEN CYTOSPIN SMEAR    Culture   Final    No growth aerobically or anaerobically. Performed at [facility], [place] [address], [place], [place] [address]    Report Status 01/31/2021 FINAL  Final  SARS CORONAVIRUS 2 (TAT 6-24 HRS) Nasopharyngeal Nasopharyngeal Swab     Status: None   Collection Time: 01/31/21  4:00 PM   Specimen: Nasopharyngeal Swab  Result Value Ref Range  Status   SARS Coronavirus 2  NEGATIVE NEGATIVE Final    Comment: (NOTE) SARS-CoV-2 target nucleic acids are NOT DETECTED.  The SARS-CoV-2 RNA is generally detectable in upper and lower respiratory specimens during the acute phase of infection. Negative results do not preclude SARS-CoV-2 infection, do not rule out co-infections with other pathogens, and should not be used as the sole basis for treatment or other patient management decisions. Negative results must be combined with clinical observations, patient history, and epidemiological information. The expected result is Negative.  Fact Sheet for Patients: SugarRoll.be  Fact Sheet for Healthcare Providers: https://www.woods-mathews.com/  This test is not yet approved or cleared by the [place] FDA and  has been authorized for detection and/or diagnosis of SARS-CoV-2 by FDA under an Emergency Use Authorization (EUA). This EUA will remain  in effect (meaning this test can be used) for the duration of the COVID-19 declaration under Se ction 564(b)(1) of the Act, 21 U.S.C. section 360bbb-3(b)(1), unless the authorization is terminated or revoked sooner.  Performed at [facility], [place] [address] [place], [place] [address]      Time coordinating discharge: 40 minutes  SIGNED:   Elmarie Shiley, MD  [facility]

## 2021-02-01 NOTE — Progress Notes (Signed)
[facility] Progress Note   Subjective:    Seen and examined On HD today. Tolerating UFG 3L. BP stable. Denies SOB and CP. Plan for HD on 8/16 if patient is still here.  Objective Vitals:   02/01/21 1000 02/01/21 1030 02/01/21 1100 02/01/21 1130  BP: (!) 142/74 (!) 161/76 (!) 145/66 (!) 147/68  Pulse: 85 81 79 83  Resp: (!) 23 (!) 21 19 (!) 21  Temp:      TempSrc:      SpO2:      Weight:      Height:       Physical Exam General: Chronically ill-appearing female; NAD Heart:Normal S1 and S2; No murmurs, gallops, or rubs Lungs:Clear throughout; No wheezing, rales, or rhonchi Abdomen: Ascites present, non-tender, active BS Extremities:No edema BLLE Dialysis Access: L AVF (+) bruit/thrill   Filed Weights   01/30/21 0422 02/01/21 0523 02/01/21 0745  Weight: 40 kg 41.5 kg 41.5 kg    Intake/Output Summary (Last 24 hours) at 02/01/2021 1228 Last data filed at 01/31/2021 1917 Gross per 24 hour  Intake 240 ml  Output --  Net 240 ml    Additional Objective Labs: Basic Metabolic Panel: Recent Labs  Lab 01/25/21 2114 01/26/21 0136 01/29/21 0611 01/30/21 0215 02/01/21 0835  NA 139   < > 136 135 134*  K 3.1*   < > 3.3* 3.9 3.6  CL 98   < > 98 97* 94*  CO2 27   < > 31 32 30  GLUCOSE 105*   < > 185* 116* 167*  BUN 13   < > 30* 12 41*  CREATININE 2.18*   < > 3.09* 1.77* 3.20*  CALCIUM 8.9   < > 9.7 8.4* 9.5  PHOS 2.3*  --  1.4*  --  1.1*   < > = values in this interval not displayed.   Liver Function Tests: Recent Labs  Lab 01/28/21 0828 01/29/21 0102 01/29/21 0611 01/30/21 0215 02/01/21 0835  AST 19 26  --  25  --   ALT 14 15  --  15  --   ALKPHOS 73 79  --  79  --   BILITOT 1.4* 1.0  --  1.2  --   PROT 5.5* 5.4*  --  5.5*  --   ALBUMIN 2.3* 2.2* 2.1* 2.2* 2.4*   No results for input(s): LIPASE, AMYLASE in the last 168 hours. CBC: Recent Labs  Lab 01/28/21 0828 01/29/21 0102 01/29/21 0611 01/30/21 0215 02/01/21 0836  WBC 9.3 6.0 5.6 5.4  9.3  NEUTROABS 8.4* 5.0  --  4.0  --   HGB 9.5* 9.2* 9.1* 9.4* 9.5*  HCT 31.1* 30.0* 30.8* 30.7* 30.9*  MCV 90.7 90.9 91.4 89.8 88.0  PLT 94* 88* 86* 96* 173   Blood Culture    Component Value Date/Time   SDES PERITONEAL FLUID 01/26/2021 1354   SPECREQUEST NONE 01/26/2021 1354   CULT  01/26/2021 1354    No growth aerobically or anaerobically. Performed at [facility], [place] [address], [place], [place] [address]    REPTSTATUS 01/31/2021 FINAL 01/26/2021 1354    Cardiac Enzymes: No results for input(s): CKTOTAL, CKMB, CKMBINDEX, TROPONINI in the last 168 hours. CBG: Recent Labs  Lab 01/31/21 0736 01/31/21 1147 01/31/21 1630 01/31/21 2232 02/01/21 0725  GLUCAP 194* 176* 142* 183* 141*   Iron Studies: No results for input(s): IRON, TIBC, TRANSFERRIN, FERRITIN in the last 72 hours. Lab Results  Component Value Date  INR 1.8 (H) 01/26/2021   INR 1.5 (H) 01/25/2021   INR 2.1 (H) 10/05/2020   Studies/Results: No results found.  Medications:  sodium chloride     sodium chloride     albumin human      (feeding supplement) PROSource Plus  30 mL Oral BID BM   aspirin  81 mg Oral Daily   atorvastatin  40 mg Oral QHS   carvedilol  3.125 mg Oral BID WC   Chlorhexidine Gluconate Cloth  6 each Topical Q0600   darbepoetin (ARANESP) injection - DIALYSIS  60 mcg Intravenous Q Sat-HD   dextrose  12.5 g Intravenous Once   doxercalciferol  4 mcg Intravenous Q T,Th,Sa-HD   feeding supplement (NEPRO CARB STEADY)  237 mL Oral TID with meals   FLUoxetine  20 mg Oral Daily   Gerhardt's butt cream   Topical TID   heparin injection (subcutaneous)  5,000 Units Subcutaneous Q8H   insulin aspart  0-5 Units Subcutaneous QHS   insulin aspart  0-6 Units Subcutaneous TID WC   levothyroxine  75 mcg Oral Q0600   multivitamin  1 tablet Oral QHS   omega-3 acid ethyl esters  2 capsule Oral BID   pantoprazole  80 mg Oral Daily   phosphorus  250 mg Oral Once   saccharomyces boulardii  250  mg Oral BID   sodium bicarbonate  1,300 mg Oral BID    Dialysis Orders: [place] T,Th,S 3:45 hr 350/500 39.5 kg 2.0 K/ 2.0 Ca AVF -Heparin 2000 units IV TIW -Hectorol 5 mcg IV TIW -Mircera 50 mcg IV q 4 weeks (last dose 01/11/2021)  Assessment/Plan: 1. Acute encephalopathy D/T E Coli bacteremia-On meropenem per primary. Last dose today. Per primary 2. Ascites-S/P paracentesis 08/06 1.1 liters. No evidence of SBP. Per primary 3. Acute on Chronic HFpEF-EF 55%. Ascites present, no peripheral edema. Continue lowing volume as tolerated. 4. ESRD -T,Th,S-seen on HD today-tolerating UFG 3L, Bps remain stable. Next HD 02/05/2021-if patient is still here. HD gap D/T staffing issues in HD unit; however will receive HD sooner if clinically indicated. 5. Anemia - HGB 9.5 Aranesp 60 mcg IV 01/26/21. 6. Secondary hyperparathyroidism - Low PO4, not on binders. C Ca still high-now 10.7-will decrease VDRA further. Monitor trends. 7. HTN/volume -Had not been getting to EDW prior to adm. Net UF 01/29/2021 3.1 liters Post wt 40.1kg  Close to OP EDW. Leave EDW as is on DC. Continue Midodrine, hold carvedilol prior to HD. 8. Nutrition -Low albumin. DYS 2 diet-add fluid restrictions, protein supps.  Disposition: Pending discharge back to SNF.   Tobie Poet, NP [facility] 02/01/2021,12:28 PM  LOS: 7 days

## 2021-02-04 LAB — ACID FAST SMEAR (AFB, MYCOBACTERIA): Acid Fast Smear: NEGATIVE

## 2021-02-05 LAB — PH, BODY FLUID: pH, Body Fluid: 7.8

## 2021-02-12 ENCOUNTER — Emergency Department (HOSPITAL_COMMUNITY): Payer: Medicare Other

## 2021-02-12 ENCOUNTER — Encounter (HOSPITAL_COMMUNITY): Payer: Self-pay | Admitting: Emergency Medicine

## 2021-02-12 ENCOUNTER — Emergency Department (HOSPITAL_COMMUNITY)
Admission: EM | Admit: 2021-02-12 | Discharge: 2021-02-12 | Disposition: A | Discharge status: Skilled Nursing Facility | Payer: Medicare Other | Attending: Emergency Medicine | Admitting: Emergency Medicine

## 2021-02-12 DIAGNOSIS — E1122 Type 2 diabetes mellitus with diabetic chronic kidney disease: Secondary | ICD-10-CM | POA: Diagnosis not present

## 2021-02-12 DIAGNOSIS — Z992 Dependence on renal dialysis: Secondary | ICD-10-CM | POA: Insufficient documentation

## 2021-02-12 DIAGNOSIS — Z79899 Other long term (current) drug therapy: Secondary | ICD-10-CM | POA: Insufficient documentation

## 2021-02-12 DIAGNOSIS — Z7982 Long term (current) use of aspirin: Secondary | ICD-10-CM | POA: Insufficient documentation

## 2021-02-12 DIAGNOSIS — M79652 Pain in left thigh: Secondary | ICD-10-CM | POA: Insufficient documentation

## 2021-02-12 DIAGNOSIS — N186 End stage renal disease: Secondary | ICD-10-CM | POA: Insufficient documentation

## 2021-02-12 DIAGNOSIS — M79651 Pain in right thigh: Secondary | ICD-10-CM | POA: Insufficient documentation

## 2021-02-12 DIAGNOSIS — I5032 Chronic diastolic (congestive) heart failure: Secondary | ICD-10-CM | POA: Insufficient documentation

## 2021-02-12 DIAGNOSIS — F17210 Nicotine dependence, cigarettes, uncomplicated: Secondary | ICD-10-CM | POA: Diagnosis not present

## 2021-02-12 DIAGNOSIS — W1839XA Other fall on same level, initial encounter: Secondary | ICD-10-CM | POA: Diagnosis not present

## 2021-02-12 DIAGNOSIS — I132 Hypertensive heart and chronic kidney disease with heart failure and with stage 5 chronic kidney disease, or end stage renal disease: Secondary | ICD-10-CM | POA: Insufficient documentation

## 2021-02-12 DIAGNOSIS — Z8673 Personal history of transient ischemic attack (TIA), and cerebral infarction without residual deficits: Secondary | ICD-10-CM | POA: Diagnosis not present

## 2021-02-12 DIAGNOSIS — R4182 Altered mental status, unspecified: Secondary | ICD-10-CM | POA: Insufficient documentation

## 2021-02-12 DIAGNOSIS — Y92002 Bathroom of unspecified non-institutional (private) residence single-family (private) house as the place of occurrence of the external cause: Secondary | ICD-10-CM | POA: Diagnosis not present

## 2021-02-12 DIAGNOSIS — R52 Pain, unspecified: Secondary | ICD-10-CM

## 2021-02-12 LAB — COMPREHENSIVE METABOLIC PANEL
ALT: 13 U/L (ref 0–44)
AST: 23 U/L (ref 15–41)
Albumin: 2.6 g/dL — ABNORMAL LOW (ref 3.5–5.0)
Alkaline Phosphatase: 89 U/L (ref 38–126)
Anion gap: 14 (ref 5–15)
BUN: 51 mg/dL — ABNORMAL HIGH (ref 6–20)
CO2: 28 mmol/L (ref 22–32)
Calcium: 8.9 mg/dL (ref 8.9–10.3)
Chloride: 88 mmol/L — ABNORMAL LOW (ref 98–111)
Creatinine, Ser: 4.61 mg/dL — ABNORMAL HIGH (ref 0.44–1.00)
GFR, Estimated: 10 mL/min — ABNORMAL LOW (ref >60)
Glucose, Bld: 111 mg/dL — ABNORMAL HIGH (ref 70–99)
Potassium: 3.3 mmol/L — ABNORMAL LOW (ref 3.5–5.1)
Sodium: 130 mmol/L — ABNORMAL LOW (ref 135–145)
Total Bilirubin: 0.9 mg/dL (ref 0.3–1.2)
Total Protein: 6.4 g/dL — ABNORMAL LOW (ref 6.5–8.1)

## 2021-02-12 LAB — CBC WITH DIFFERENTIAL/PLATELET
Abs Immature Granulocytes: 0.02 10*3/uL (ref 0.00–0.07)
Basophils Absolute: 0.1 10*3/uL (ref 0.0–0.1)
Basophils Relative: 1 %
Eosinophils Absolute: 0.1 10*3/uL (ref 0.0–0.5)
Eosinophils Relative: 2 %
HCT: 32.5 % — ABNORMAL LOW (ref 36.0–46.0)
Hemoglobin: 10.2 g/dL — ABNORMAL LOW (ref 12.0–15.0)
Immature Granulocytes: 0 %
Lymphocytes Relative: 9 %
Lymphs Abs: 0.6 10*3/uL — ABNORMAL LOW (ref 0.7–4.0)
MCH: 27.3 pg (ref 26.0–34.0)
MCHC: 31.4 g/dL (ref 30.0–36.0)
MCV: 86.9 fL (ref 80.0–100.0)
Monocytes Absolute: 0.9 10*3/uL (ref 0.1–1.0)
Monocytes Relative: 13 %
Neutro Abs: 4.9 10*3/uL (ref 1.7–7.7)
Neutrophils Relative %: 75 %
Platelets: 307 10*3/uL (ref 150–400)
RBC: 3.74 MIL/uL — ABNORMAL LOW (ref 3.87–5.11)
RDW: 21.2 % — ABNORMAL HIGH (ref 11.5–15.5)
WBC: 6.6 10*3/uL (ref 4.0–10.5)
nRBC: 0 % (ref 0.0–0.2)

## 2021-02-12 MED ORDER — ACETAMINOPHEN 500 MG PO TABS
500.0000 mg | ORAL_TABLET | Freq: Once | ORAL | Status: AC
  Administered 2021-02-12: 500 mg via ORAL
  Filled 2021-02-12: qty 1

## 2021-02-12 MED ORDER — FENTANYL CITRATE PF 50 MCG/ML IJ SOSY
25.0000 ug | PREFILLED_SYRINGE | Freq: Once | INTRAMUSCULAR | Status: DC
Start: 2021-02-12 — End: 2021-02-12

## 2021-02-12 NOTE — ED Provider Notes (Signed)
[facility] EMERGENCY DEPARTMENT Provider Note   CSN: [id number] Arrival date & time: 02/12/21  [id number]     History No chief complaint on file.   [name] Michael is a 60 y.o. female.  Presented to the emergency room with concern for generalized body pain.  Patient reports that on Friday evening she fell in the bathroom.  Felt lightheaded and felt like she was going to pass out but did not pass out.  Thinks that she may have hit her head.  States that she mostly has pain around her buttocks region.  This is why she has missed the last 2 dialysis appointments.  Wears 3 L nasal cannula at baseline.  HPI     Past Medical History:  Diagnosis Date   Ambulates with cane    Constipation    Depression    ESRD (end stage renal disease) ([place])    a. TTS Dialysis.   GERD (gastroesophageal reflux disease)    History of stress test    a. 01/2003 MV: EF 74%, no ischemia/infarct.   Hyperlipidemia    Hypertension    Osteoporosis    Stroke [facility]) 04-01-11   left frontal subcortical, saw Dr. [name]    Syncope 11/2019   TIA (transient ischemic attack) 03-12-11   Tobacco abuse    Tricuspid regurgitation    a. 05/2016 Echo: EF 65-70%, Gr2DD, mild MR, nl RV fxn, Triv TR, PASP 68mHg; b. 05/2018 Echo: EF 60-65%, Gr2DD, mild MR/TR, RVSP/PASP 364mg; c. 11/2019 Echo: EF 55-60%, no rwma, mild-mod MR, Sev TR w/ RV dilatation (CTA chest neg for PE).   Type II diabetes mellitus ([facility]   Vitamin D deficiency     Patient Active Problem List   Diagnosis Date Noted   Abdominal ascites 01/25/2021   Lymphocytopenia 10/13/2020   Ear hematoma, right, initial encounter 10/13/2020   Prolonged QT interval 10/13/2020   Lactic acidosis 10/05/2020   Transaminitis 10/05/2020   Thrombocytopenia ([facility]04/15/2022   Acute respiratory failure with hypoxia ([facility]04/15/2022   Altered mental status 030000000 Acute metabolic encephalopathy [id number] Severe tricuspid regurgitation 12/04/2019   ESRD (end  stage renal disease) ([place]   GERD (gastroesophageal reflux disease)    Hypertension    Type II diabetes mellitus ([place]   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   AMS (altered mental status) 04/28/2019   Hypoxemia 04/28/2019   Fall 06/09/2018   Fall at home, initial encounter 06/09/2018   Anemia of chronic disease 06/09/2018   Syncope and collapse 06/09/2018   Acute encephalopathy 03/10/2018   Hypermagnesemia 03/10/2018   Acute lower UTI 11/09/2016   Uncontrolled type 2 diabetes mellitus with complication ([facility]   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 associated with diabetes mellitus due to underlying condition (HCC)    Chronic diastolic CHF (congestive heart failure) ([facility]   Hypokalemia 06/11/2016   Proliferative diabetic retinopathy ([facility]11/12/2015   Poor social situation 03/09/2013   Depression 03/01/2013   Abnormal mammogram 12/20/2012   Retinal detachment 11/17/2012   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 ([id number] Hyperlipidemia 02/09/2007   Essential hypertension 02/09/2007    Past Surgical History:  Procedure Laterality Date   AV FISTULA PLACEMENT Left 02/21/2019   Procedure: BRACHIOCEPHALIC ARTERIOVENOUS (AV) FISTULA CREATION;  Surgeon: ClMarty HeckMD;  [place] Service: Vascular;  Laterality: Left;   [place] 04/11/2019   Procedure: SECOND STAGE  BASILIC VEIN TRANSPOSITION LEFT ARM;  Surgeon: [name], MD;  Location: [place];  Service: Vascular;  Laterality: Left;   IR FLUORO GUIDE CV LINE RIGHT  04/29/2019   IR US GUIDE VASC ACCESS RIGHT  04/29/2019   OPEN REDUCTION INTERNAL FIXATION (ORIF) DISTAL RADIAL FRACTURE Left 01/28/2016   Procedure: OPEN REDUCTION INTERNAL FIXATION (ORIF) DISTAL RADIAL FRACTURE;  Surgeon: Iran Planas, MD;  Location: [place];  Service: Orthopedics;  Laterality: Left;   [place]   RIGHT HEART CATH N/A  12/05/2019   Procedure: RIGHT HEART CATH;  Surgeon: Nelva Bush, MD;  Location: [place] CV LAB;  Service: Cardiovascular;  Laterality: N/A;   TONSILLECTOMY AND ADENOIDECTOMY     age 42     OB History   No obstetric history on file.     Family History  Problem Relation Age of Onset   Stroke Mother    Diabetes Mother    Kidney failure Mother    Heart failure Mother    Stroke Father    Cancer Sister        Breast- 83's   Colon cancer Maternal Grandmother    Pancreatic cancer Neg Hx    Esophageal cancer Neg Hx    Liver disease Neg Hx    Stomach cancer Neg Hx     Social History   Tobacco Use   Smoking status: Every Day    Packs/day: 0.30    Years: 32.00    Pack years: 9.60    Types: Cigarettes    Last attempt to quit: 09/23/2012    Years since quitting: 8.3    Passive exposure: Current   Smokeless tobacco: Never   Tobacco comments:    8 cigarettes/day  Vaping Use   Vaping Use: Never used  Substance Use Topics   Alcohol use: No   Drug use: No    Home Medications Prior to Admission medications   Medication Sig Start Date End Date Taking? Authorizing Provider  Amino Acids-Protein Hydrolys (FEEDING SUPPLEMENT, PRO-STAT SUGAR FREE 64,) LIQD Take 30 mLs by mouth in the morning. WILD CHERRY FLAVOR    [provider]  aspirin 81 MG chewable tablet Chew 81 mg by mouth daily.    [provider]  atorvastatin (LIPITOR) 40 MG tablet Take 40 mg by mouth at bedtime.     [provider]  b complex-vitamin c-folic acid (NEPHRO-VITE) 0.8 MG TABS tablet Take 1 tablet by mouth at bedtime.    [provider]  carvedilol (COREG) 3.125 MG tablet Take 1 tablet (3.125 mg total) by mouth 2 (two) times daily with a meal. 02/01/21   Regalado, [name], MD  cloNIDine (CATAPRES) 0.1 MG tablet Take 0.1 mg by mouth every 8 (eight) hours as needed (BP>160/90).    [provider]  FLUoxetine (PROZAC) 20 MG capsule Take 20 mg by mouth daily. 12/21/20    [provider]  levothyroxine (SYNTHROID) 75 MCG tablet Take 1 tablet (75 mcg total) by mouth daily at 6 (six) AM. 02/02/21   Regalado, [name], MD  Lido-PE-Glycerin-Petrolatum (PREPARATION H RAPID RELIEF EX) Apply topically every 6 (six) hours as needed (hemorrhoid).    [provider]  midodrine (PROAMATINE) 10 MG tablet Take 10 mg by mouth See admin instructions. Take 1 tablet (10 mg) by mouth prior to dialysis on Tuesday, Thursday, Saturday    [provider]  Nutritional Supplements (NOVASOURCE RENAL) LIQD Take 237 mLs by mouth with  breakfast, with lunch, and with evening meal.    [provider]  omega-3 acid ethyl esters (LOVAZA) 1 g capsule Take 2 capsules by mouth 2 (two) times daily. 05/28/20   [provider]  omeprazole (PRILOSEC) 40 MG capsule Take 40 mg by mouth daily before breakfast.     [provider]  ondansetron (ZOFRAN) 4 MG tablet Take 4 mg by mouth every 8 (eight) hours as needed for nausea or vomiting.    [provider]  polyethylene glycol powder (GLYCOLAX/MIRALAX) 17 GM/SCOOP powder Take 17 g by mouth See admin instructions. Mix 17 grams of powder into 8 ounces of fluid, stir, and drink (by mouth) once a day    [provider]  SACCHAROMYCES BOULARDII PO Take 1 capsule by mouth 2 (two) times daily.    [provider]  senna-docusate (SENOKOT-S) 8.6-50 MG tablet Take 1 tablet by mouth at bedtime as needed for mild constipation. 06/11/18   Regalado, [name], MD  sodium bicarbonate 650 MG tablet Take 1,300 mg by mouth 2 (two) times daily.    [provider]    Allergies    Hydrocodone  Review of Systems   Review of Systems  Physical Exam Updated Vital Signs BP (!) 146/83   Pulse 77   Temp 98.1 F (36.7 C) (Oral)   Resp 16   LMP 03/06/2013 (LMP Unknown)   SpO2 99%   Physical Exam Vitals and nursing note reviewed.  Constitutional:      General: She is not in acute  distress.    Appearance: She is well-developed.     Comments: Chronically ill-appearing but no acute distress  HENT:     Head: Normocephalic and atraumatic.  Eyes:     Conjunctiva/sclera: Conjunctivae normal.  Cardiovascular:     Rate and Rhythm: Normal rate and regular rhythm.     Heart sounds: No murmur heard. Pulmonary:     Effort: Pulmonary effort is normal. No respiratory distress.     Breath sounds: Normal breath sounds.  Abdominal:     Palpations: Abdomen is soft.     Tenderness: There is no abdominal tenderness.  Genitourinary:    Comments: Superficial skin breakdown over upper gluteal region, no open wounds Musculoskeletal:     Cervical back: Neck supple.  Skin:    General: Skin is warm and dry.  Neurological:     General: No focal deficit present.     Mental Status: She is alert.  Psychiatric:        Mood and Affect: Mood normal.    ED Results / Procedures / Treatments   Labs (all labs ordered are listed, but only abnormal results are displayed) Labs Reviewed  COMPREHENSIVE METABOLIC PANEL - Abnormal; Notable for the following components:      Result Value   Sodium 130 (*)    Potassium 3.3 (*)    Chloride 88 (*)    Glucose, Bld 111 (*)    BUN 51 (*)    Creatinine, Ser 4.61 (*)    Total Protein 6.4 (*)    Albumin 2.6 (*)    GFR, Estimated 10 (*)    All other components within normal limits  CBC WITH DIFFERENTIAL/PLATELET - Abnormal; Notable for the following components:   RBC 3.74 (*)    Hemoglobin 10.2 (*)    HCT 32.5 (*)    RDW 21.2 (*)    Lymphs Abs 0.6 (*)    All other components within normal limits  URINALYSIS, ROUTINE  W REFLEX MICROSCOPIC    EKG EKG Interpretation  Date/Time:  Tuesday February 12 2021 12:25:43 EDT Ventricular Rate:  77 PR Interval:    QRS Duration: 138 QT Interval:  447 QTC Calculation: 506 R Axis:   113 Text Interpretation: Sinus rhythm Right bundle branch block Nonspecific T abnormalities, lateral leads Confirmed by  Madalyn Rob [phone]) on 02/12/2021 12:32:07 PM  Radiology CT HEAD WO CONTRAST (5MM)  Result Date: 02/12/2021 CLINICAL DATA:  Head trauma, abnormal mental status EXAM: CT HEAD WITHOUT CONTRAST TECHNIQUE: Contiguous axial images were obtained from the base of the skull through the vertex without intravenous contrast. COMPARISON:  April 2022 FINDINGS: Brain: There is no acute intracranial hemorrhage, mass effect, or edema. Gray-white differentiation is preserved. There is no extra-axial fluid collection. Prominence of the ventricles and sulci reflects generalized parenchymal volume loss. Patchy and confluent areas of low-attenuation in the supratentorial white matter are nonspecific but probably reflect similar moderate chronic microvascular ischemic changes. Chronic small vessel infarcts of the central gray nuclei. Vascular: There is atherosclerotic calcification at the skull base. Skull: Calvarium is unremarkable. Sinuses/Orbits: No acute finding. Other: None. IMPRESSION: No evidence of acute intracranial injury. Electronically Signed   By: Macy Mis M.D.   On: 02/12/2021 14:42   CT Cervical Spine Wo Contrast  Result Date: 02/12/2021 CLINICAL DATA:  Neck trauma, dangerous injury mechanism EXAM: CT CERVICAL SPINE WITHOUT CONTRAST TECHNIQUE: Multidetector CT imaging of the cervical spine was performed without intravenous contrast. Multiplanar CT image reconstructions were also generated. COMPARISON:  None. FINDINGS: Alignment: No significant listhesis. Skull base and vertebrae: Vertebral body heights are maintained. No acute fracture. Soft tissues and spinal canal: No prevertebral fluid or swelling. No visible canal hematoma. Disc levels:  No significant degenerative stenosis. Upper chest: Negative. Other: None. IMPRESSION: No acute cervical spine fracture. Electronically Signed   By: Macy Mis M.D.   On: 02/12/2021 14:54   DG Pelvis Portable  Result Date: 02/12/2021 CLINICAL DATA:  Fall,  syncope EXAM: PORTABLE PELVIS 1-2 VIEWS COMPARISON:  None. FINDINGS: There is no evidence of pelvic fracture or diastasis. No pelvic bone lesions are seen. IMPRESSION: No fracture or dislocation Electronically Signed   By: Suzy Bouchard M.D.   On: 02/12/2021 12:47   DG Chest Portable 1 View  Result Date: 02/12/2021 CLINICAL DATA:  Syncope.  Fall. EXAM: PORTABLE CHEST 1 VIEW COMPARISON:  01/25/2021 FINDINGS: Stable cardiac enlargement. Aortic atherosclerosis. No pleural effusion or interstitial edema. No airspace densities. Stable scar versus scratch set scar like density within the left midlung is unchanged. IMPRESSION: No acute cardiopulmonary abnormalities. Electronically Signed   By: Kerby Moors M.D.   On: 02/12/2021 12:48    Procedures Procedures   Medications Ordered in ED Medications  fentaNYL (SUBLIMAZE) injection 25 mcg (has no administration in time range)    ED Course  I have reviewed the triage vital signs and the nursing notes.  Pertinent labs & imaging results that were available during my care of the patient were reviewed by me and considered in my medical decision making (see chart for details).    MDM Rules/Calculators/A&P                           60 year old lady with multiple medical issues, recent admission for encephalopathy from [facility] E. coli bacteremia, ESRD.  Presents to ER with concern for generalized body aches, dialysis.  On exam patient looks chronically ill but not in distress.  Her vital  signs are stable.  She on further history taking endorsed near syncopal event at her facility and possible head trauma.  No apparent trauma on exam.  Check basic blood work, EKG, placed on cardiac monitor.  Additionally CT head, C-spine, plain films of the chest and pelvis.  Labs noted for elevation in creatinine but potassium stable.  No indication for emergent dialysis at present.  Trauma work-up was negative.  At present point she can be discharged and managed in her  facility.  Recommended pt follow-up with her primary care as well as her nephrologist.     After the discussed management above, the patient was determined to be safe for discharge.  The patient was in agreement with this plan and all questions regarding their care were answered.  ED return precautions were discussed and the patient will return to the ED with any significant worsening of condition.  Final Clinical Impression(s) / ED Diagnoses Final diagnoses:  None    Rx / DC Orders ED Discharge Orders     None        Lucrezia Starch, MD 02/12/21 1539

## 2021-02-12 NOTE — ED Triage Notes (Signed)
Pt here from [place] with c/o gen body aches and pain and missed 2 dialysis appointments due to pain

## 2021-02-12 NOTE — ED Notes (Signed)
Ptar called unable to give pick up time 

## 2021-02-12 NOTE — ED Notes (Signed)
Report given to Courtney, RN.

## 2021-02-12 NOTE — Discharge Instructions (Addendum)
Please follow-up both with your primary care doctor as well as with the kidney specialist.  Recommend continuing dialysis.  You should not skip any of your dialysis sessions.  Come back to ER if you develop chest pain, difficulty breathing, fever or other new concerning symptom.

## 2021-02-12 NOTE — ED Provider Notes (Signed)
Emergency Medicine Provider Triage Evaluation Note  [name] Michael , a 60 y.o. female  was evaluated in triage.  Pt complains of diffuse body pain/generalized weakness. Pt reports she "fell into the bathroom wall" this weekend and has been having diffuse pain since then however mostly in her buttock region. She reports hx of chronic diarrhea, currently covered in diarrhea in wheelchair. She has felt so bad that she has missed 2 dialysis appointments. Denies SOB. Also recent admission for EBSL.   Review of Systems  Positive: + buttock pain/body aches, generalized weakness Negative: - fevers, SOB  Physical Exam  LMP 03/06/2013 (LMP Unknown)  Gen:   Awake, no distress   Resp:  Normal effort  MSK:   Moves extremities without difficulty  Other:  Unable to assess buttock region pain at this time as pt is covered in diarrhea from fecal incontinence (hx of same)  Medical Decision Making  Medically screening exam initiated at 10:47 AM.  Appropriate orders placed.  Donn L Barre was informed that the remainder of the evaluation will be completed by another provider, this initial triage assessment does not replace that evaluation, and the importance of remaining in the ED until their evaluation is complete.     Eustaquio Maize, PA-C 02/12/21 1049    Malvin Johns, MD 02/12/21 1055

## 2021-02-12 NOTE — ED Notes (Signed)
I introduced myself to pt. Pt drowsy, but oriented x4. On 3 L [place], but it's her home O2. Pt said the tylenol did help with her pain. I went over d/c paperwork with her. I told her she needs to make sure she goes to dialysis on Thursday and told her I told Sophronia Simas the same thing. Pt requesting to sleep so I shut the lights off and cracked the door.

## 2021-02-16 ENCOUNTER — Emergency Department (HOSPITAL_COMMUNITY)
Admission: EM | Admit: 2021-02-16 | Discharge: 2021-02-17 | Disposition: A | Discharge status: Home / Self Care | Payer: Medicare Other | Source: Home / Self Care | Attending: Emergency Medicine | Admitting: Emergency Medicine

## 2021-02-16 ENCOUNTER — Encounter (HOSPITAL_COMMUNITY): Payer: Self-pay | Admitting: Emergency Medicine

## 2021-02-16 ENCOUNTER — Other Ambulatory Visit: Payer: Self-pay

## 2021-02-16 ENCOUNTER — Emergency Department (HOSPITAL_COMMUNITY): Payer: Medicare Other

## 2021-02-16 DIAGNOSIS — R300 Dysuria: Secondary | ICD-10-CM

## 2021-02-16 DIAGNOSIS — E113513 Type 2 diabetes mellitus with proliferative diabetic retinopathy with macular edema, bilateral: Secondary | ICD-10-CM | POA: Insufficient documentation

## 2021-02-16 DIAGNOSIS — L03317 Cellulitis of buttock: Secondary | ICD-10-CM

## 2021-02-16 DIAGNOSIS — I132 Hypertensive heart and chronic kidney disease with heart failure and with stage 5 chronic kidney disease, or end stage renal disease: Secondary | ICD-10-CM | POA: Insufficient documentation

## 2021-02-16 DIAGNOSIS — Z992 Dependence on renal dialysis: Secondary | ICD-10-CM | POA: Insufficient documentation

## 2021-02-16 DIAGNOSIS — Z20822 Contact with and (suspected) exposure to covid-19: Secondary | ICD-10-CM | POA: Insufficient documentation

## 2021-02-16 DIAGNOSIS — Z79899 Other long term (current) drug therapy: Secondary | ICD-10-CM | POA: Insufficient documentation

## 2021-02-16 DIAGNOSIS — J9621 Acute and chronic respiratory failure with hypoxia: Secondary | ICD-10-CM | POA: Diagnosis not present

## 2021-02-16 DIAGNOSIS — N186 End stage renal disease: Secondary | ICD-10-CM | POA: Insufficient documentation

## 2021-02-16 DIAGNOSIS — F17210 Nicotine dependence, cigarettes, uncomplicated: Secondary | ICD-10-CM | POA: Insufficient documentation

## 2021-02-16 DIAGNOSIS — E1122 Type 2 diabetes mellitus with diabetic chronic kidney disease: Secondary | ICD-10-CM | POA: Insufficient documentation

## 2021-02-16 DIAGNOSIS — K6289 Other specified diseases of anus and rectum: Secondary | ICD-10-CM

## 2021-02-16 DIAGNOSIS — I5032 Chronic diastolic (congestive) heart failure: Secondary | ICD-10-CM | POA: Insufficient documentation

## 2021-02-16 DIAGNOSIS — Z7982 Long term (current) use of aspirin: Secondary | ICD-10-CM | POA: Insufficient documentation

## 2021-02-16 DIAGNOSIS — R0602 Shortness of breath: Secondary | ICD-10-CM | POA: Insufficient documentation

## 2021-02-16 DIAGNOSIS — L8990 Pressure ulcer of unspecified site, unspecified stage: Secondary | ICD-10-CM | POA: Insufficient documentation

## 2021-02-16 LAB — COMPREHENSIVE METABOLIC PANEL
ALT: 14 U/L (ref 0–44)
AST: 26 U/L (ref 15–41)
Albumin: 2.7 g/dL — ABNORMAL LOW (ref 3.5–5.0)
Alkaline Phosphatase: 101 U/L (ref 38–126)
Anion gap: 12 (ref 5–15)
BUN: 33 mg/dL — ABNORMAL HIGH (ref 6–20)
CO2: 30 mmol/L (ref 22–32)
Calcium: 8.8 mg/dL — ABNORMAL LOW (ref 8.9–10.3)
Chloride: 91 mmol/L — ABNORMAL LOW (ref 98–111)
Creatinine, Ser: 3.31 mg/dL — ABNORMAL HIGH (ref 0.44–1.00)
GFR, Estimated: 15 mL/min — ABNORMAL LOW (ref >60)
Glucose, Bld: 128 mg/dL — ABNORMAL HIGH (ref 70–99)
Potassium: 3.1 mmol/L — ABNORMAL LOW (ref 3.5–5.1)
Sodium: 133 mmol/L — ABNORMAL LOW (ref 135–145)
Total Bilirubin: 1 mg/dL (ref 0.3–1.2)
Total Protein: 6.9 g/dL (ref 6.5–8.1)

## 2021-02-16 LAB — CBC WITH DIFFERENTIAL/PLATELET
Abs Immature Granulocytes: 0 10*3/uL (ref 0.00–0.07)
Basophils Absolute: 0.1 10*3/uL (ref 0.0–0.1)
Basophils Relative: 2 %
Eosinophils Absolute: 0.1 10*3/uL (ref 0.0–0.5)
Eosinophils Relative: 1 %
HCT: 34.6 % — ABNORMAL LOW (ref 36.0–46.0)
Hemoglobin: 10.7 g/dL — ABNORMAL LOW (ref 12.0–15.0)
Lymphocytes Relative: 10 %
Lymphs Abs: 0.7 10*3/uL (ref 0.7–4.0)
MCH: 27.4 pg (ref 26.0–34.0)
MCHC: 30.9 g/dL (ref 30.0–36.0)
MCV: 88.5 fL (ref 80.0–100.0)
Monocytes Absolute: 0.3 10*3/uL (ref 0.1–1.0)
Monocytes Relative: 4 %
Neutro Abs: 5.9 10*3/uL (ref 1.7–7.7)
Neutrophils Relative %: 83 %
Platelets: 251 10*3/uL (ref 150–400)
RBC: 3.91 MIL/uL (ref 3.87–5.11)
RDW: 21.1 % — ABNORMAL HIGH (ref 11.5–15.5)
WBC: 7.1 10*3/uL (ref 4.0–10.5)
nRBC: 0 % (ref 0.0–0.2)
nRBC: 0 /100 WBC

## 2021-02-16 LAB — LIPASE, BLOOD: Lipase: 32 U/L (ref 11–51)

## 2021-02-16 LAB — RESP PANEL BY RT-PCR (FLU A&B, COVID) ARPGX2
Influenza A by PCR: NEGATIVE
Influenza B by PCR: NEGATIVE
SARS Coronavirus 2 by RT PCR: NEGATIVE

## 2021-02-16 LAB — LACTIC ACID, PLASMA
Lactic Acid, Venous: 2.4 mmol/L (ref 0.5–1.9)
Lactic Acid, Venous: 1.9 mmol/L (ref 0.5–1.9)

## 2021-02-16 MED ORDER — FENTANYL CITRATE PF 50 MCG/ML IJ SOSY
50.0000 ug | PREFILLED_SYRINGE | Freq: Once | INTRAMUSCULAR | Status: AC
  Administered 2021-02-16: 50 ug via INTRAVENOUS
  Filled 2021-02-16: qty 1

## 2021-02-16 NOTE — ED Notes (Signed)
Attempted IV with no success. Another RN is attempting IV and labs at this time. Phlebotomist notified to get 2nd blood cultures set.

## 2021-02-16 NOTE — ED Provider Notes (Signed)
Emergency Medicine Provider Triage Evaluation Note  MERIA RICHHART , a 60 y.o. female  was evaluated in triage.  Patient from [facility] here for evaluation of buttocks pain she states that she has had severe pain for several months at this point she states that her pain today while at dialysis was of severe that she was sent by dialysis to the ER for evaluation of her buttocks pain.  Had fevers or chills.  Review of Systems  Positive: Buttocks pain Negative: Fever  Physical Exam  BP (!) 159/114 (BP Location: Right Arm)   Pulse 77   Temp 97.8 F (36.6 C) (Oral)   Resp 16   LMP 03/06/2013 (LMP Unknown)   SpO2 94%  Gen:   Awake, no distress   Resp:  Normal effort  MSK:   Moves extremities without difficulty  Other:  Exam is limited due to patient positioning, being in triage room for evaluation.  There is erythema of buttocks.  Patient actively began pooping while I was examining her bottom.  There is some bleeding present below the bandage is removed.  Medical Decision Making  Medically screening exam initiated at 2:31 PM.  Appropriate orders placed.  Chamara L Fukuda was informed that the remainder of the evaluation will be completed by another provider, this initial triage assessment does not replace that evaluation, and the importance of remaining in the ED until their evaluation is complete.  Patient is dialysis patient does seem to have some chronic pain but this seems worse.  My physical exam was quite limited due to patient positioning and being in the triage room during the evaluation.  In fact patient started defecating, exam was occurring.  Certainly has some risk for infection of what ever wound is in this area given that she seems to be defecating into that area.  We will check basic labs.  Patient received approximately 30 minutes of her normally 3 to 4-hour dialysis session.   Pati Gallo [place], [place] 02/16/21 1436    Lacretia Leigh, MD 02/19/21 [phone]

## 2021-02-16 NOTE — ED Triage Notes (Signed)
Patient from [facility], was at dialysis, had 30 min of treatment, brought for chronic pain. VSS.

## 2021-02-16 NOTE — ED Notes (Signed)
Pt was urinary incontinent and bowel incontinent. Pt was cleaned by this RN and [place], NT, linens and gown changed, and sacral pad replaced. Warm blankets applied to pt.

## 2021-02-16 NOTE — ED Notes (Signed)
Dr. Sherry Ruffing made aware of pt critical lactic acid result 2.4

## 2021-02-16 NOTE — ED Provider Notes (Signed)
[facility] EMERGENCY DEPARTMENT Provider Note   CSN: [id number] Arrival date & time: 02/16/21  1354     History Chief Complaint  Patient presents with   Pressure sore   Pain    [name] Michael is a 60 y.o. female.  The history is provided by the patient and medical records. No language interpreter was used.  Illness Location:  Pain and rash around rectum. Quality:  Chronic but worsened Severity:  Severe Onset quality:  Gradual Timing:  Constant Progression:  Unchanged Chronicity:  New Associated symptoms: rash and shortness of breath (mild)   Associated symptoms: no abdominal pain, no chest pain, no congestion, no cough, no diarrhea, no fatigue, no fever, no headaches, no myalgias, no nausea, no vomiting and no wheezing       Past Medical History:  Diagnosis Date   Ambulates with cane    Constipation    Depression    ESRD (end stage renal disease) ([place])    a. TTS Dialysis.   GERD (gastroesophageal reflux disease)    History of stress test    a. 01/2003 MV: EF 74%, no ischemia/infarct.   Hyperlipidemia    Hypertension    Osteoporosis    Stroke [facility]) 04-01-11   left frontal subcortical, saw Dr. [name]    Syncope 11/2019   TIA (transient ischemic attack) 03-12-11   Tobacco abuse    Tricuspid regurgitation    a. 05/2016 Echo: EF 65-70%, Gr2DD, mild MR, nl RV fxn, Triv TR, PASP 4mHg; b. 05/2018 Echo: EF 60-65%, Gr2DD, mild MR/TR, RVSP/PASP 352mg; c. 11/2019 Echo: EF 55-60%, no rwma, mild-mod MR, Sev TR w/ RV dilatation (CTA chest neg for PE).   Type II diabetes mellitus ([facility]   Vitamin D deficiency     Patient Active Problem List   Diagnosis Date Noted   Abdominal ascites 01/25/2021   Lymphocytopenia 10/13/2020   Ear hematoma, right, initial encounter 10/13/2020   Prolonged QT interval 10/13/2020   Lactic acidosis 10/05/2020   Transaminitis 10/05/2020   Thrombocytopenia ([facility]CE[place]04/15/2022   Acute respiratory failure with hypoxia ([place] 10/05/2020   Altered mental status 030000000 Acute metabolic encephalopathy 0399991111 Severe tricuspid regurgitation 12/04/2019   ESRD (end stage renal disease) ([facility]   GERD (gastroesophageal reflux disease)    Hypertension    Type II diabetes mellitus ([place]   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   AMS (altered mental status) 04/28/2019   Hypoxemia 04/28/2019   Fall 06/09/2018   Fall at home, initial encounter 06/09/2018   Anemia of chronic disease 06/09/2018   Syncope and collapse 06/09/2018   Acute encephalopathy 03/10/2018   Hypermagnesemia 03/10/2018   Acute lower UTI 11/09/2016   Uncontrolled type 2 diabetes mellitus with complication ([facility]   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 associated with diabetes mellitus due to underlying condition (HCC)    Chronic diastolic CHF (congestive heart failure) ([facility]   Hypokalemia 06/11/2016   Proliferative diabetic retinopathy ([place]11/12/2015   Poor social situation 03/09/2013   Depression 03/01/2013   Abnormal mammogram 12/20/2012   Retinal detachment 11/17/2012   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 ([id number] Hyperlipidemia 02/09/2007   Essential hypertension 02/09/2007    Past Surgical History:  Procedure Laterality Date   AV FISTULA PLACEMENT Left 02/21/2019   Procedure: BRACHIOCEPHALIC ARTERIOVENOUS (AV) FISTULA CREATION;  Surgeon: ClMarty HeckMD;  [place] Service: [facility];  Laterality:  Left;   BASCILIC VEIN TRANSPOSITION Left 04/11/2019   Procedure: SECOND STAGE BASILIC VEIN TRANSPOSITION LEFT ARM;  Surgeon: [name], MD;  Location: [place];  Service: Vascular;  Laterality: Left;   IR FLUORO GUIDE CV LINE RIGHT  04/29/2019   IR US GUIDE VASC ACCESS RIGHT  04/29/2019   OPEN REDUCTION INTERNAL FIXATION (ORIF) DISTAL RADIAL FRACTURE Left 01/28/2016   Procedure: OPEN REDUCTION INTERNAL FIXATION (ORIF) DISTAL RADIAL FRACTURE;  Surgeon: Iran Planas, MD;  Location: [place];  Service: Orthopedics;  Laterality: Left;   [place]   RIGHT HEART CATH N/A 12/05/2019   Procedure: RIGHT HEART CATH;  Surgeon: Nelva Bush, MD;  Location: [place] CV LAB;  Service: Cardiovascular;  Laterality: N/A;   TONSILLECTOMY AND ADENOIDECTOMY     age 77     OB History   No obstetric history on file.     Family History  Problem Relation Age of Onset   Stroke Mother    Diabetes Mother    Kidney failure Mother    Heart failure Mother    Stroke Father    Cancer Sister        Breast- 54's   Colon cancer Maternal Grandmother    Pancreatic cancer Neg Hx    Esophageal cancer Neg Hx    Liver disease Neg Hx    Stomach cancer Neg Hx     Social History   Tobacco Use   Smoking status: Every Day    Packs/day: 0.30    Years: 32.00    Pack years: 9.60    Types: Cigarettes    Last attempt to quit: 09/23/2012    Years since quitting: 8.4    Passive exposure: Current   Smokeless tobacco: Never   Tobacco comments:    8 cigarettes/day  Vaping Use   Vaping Use: Never used  Substance Use Topics   Alcohol use: No   Drug use: No    Home Medications Prior to Admission medications   Medication Sig Start Date End Date Taking? Authorizing Provider  Amino Acids-Protein Hydrolys (FEEDING SUPPLEMENT, PRO-STAT SUGAR FREE 64,) LIQD Take 30 mLs by mouth in the morning. WILD CHERRY FLAVOR    [provider]  aspirin 81 MG chewable tablet Chew 81 mg by mouth daily.    [provider]  atorvastatin (LIPITOR) 40 MG tablet Take 40 mg by mouth at bedtime.     [provider]  b complex-vitamin c-folic acid (NEPHRO-VITE) 0.8 MG TABS tablet Take 1 tablet by mouth at bedtime.    [provider]  carvedilol (COREG) 3.125 MG tablet Take 1 tablet (3.125 mg total) by mouth 2 (two) times daily with a meal. 02/01/21   Regalado, [name], MD  cloNIDine (CATAPRES) 0.1 MG tablet Take 0.1 mg by mouth  every 8 (eight) hours as needed (BP>160/90).    [provider]  FLUoxetine (PROZAC) 20 MG capsule Take 20 mg by mouth daily. 12/21/20   [provider]  levothyroxine (SYNTHROID) 75 MCG tablet Take 1 tablet (75 mcg total) by mouth daily at 6 (six) AM. 02/02/21   Regalado, [name], MD  Lido-PE-Glycerin-Petrolatum (PREPARATION H RAPID RELIEF EX) Apply topically every 6 (six) hours as needed (hemorrhoid).    [provider]  midodrine (PROAMATINE) 10 MG tablet Take 10 mg by mouth See admin instructions. Take 1 tablet (10 mg) by mouth prior to dialysis on Tuesday, Thursday, Saturday    [provider]  Nutritional Supplements (NOVASOURCE RENAL) LIQD Take 237 mLs by mouth with breakfast, with lunch, and with evening meal.    [provider]  omega-3 acid ethyl esters (LOVAZA) 1 g capsule Take 2 capsules by mouth 2 (two) times daily. 05/28/20   [provider]  omeprazole (PRILOSEC) 40 MG capsule Take 40 mg by mouth daily before breakfast.     [provider]  ondansetron (ZOFRAN) 4 MG tablet Take 4 mg by mouth every 8 (eight) hours as needed for nausea or vomiting.    [provider]  polyethylene glycol powder (GLYCOLAX/MIRALAX) 17 GM/SCOOP powder Take 17 g by mouth See admin instructions. Mix 17 grams of powder into 8 ounces of fluid, stir, and drink (by mouth) once a day    [provider]  SACCHAROMYCES BOULARDII PO Take 1 capsule by mouth 2 (two) times daily.    [provider]  senna-docusate (SENOKOT-S) 8.6-50 MG tablet Take 1 tablet by mouth at bedtime as needed for mild constipation. 06/11/18   Regalado, [name], MD  sodium bicarbonate 650 MG tablet Take 1,300 mg by mouth 2 (two) times daily.    [provider]    Allergies    Hydrocodone  Review of Systems   Review of Systems  Constitutional:  Negative for chills, fatigue and fever.  HENT:  Negative for congestion.   Respiratory:  Positive for  shortness of breath (mild). Negative for cough, chest tightness and wheezing.   Cardiovascular:  Negative for chest pain.  Gastrointestinal:  Negative for abdominal pain, constipation, diarrhea, nausea and vomiting.  Genitourinary:  Positive for dysuria. Negative for flank pain and frequency.  Musculoskeletal:  Negative for back pain and myalgias.  Skin:  Positive for rash. Negative for wound.  Neurological:  Negative for dizziness, light-headedness and headaches.  All other systems reviewed and are negative.  Physical Exam Updated Vital Signs BP (!) 185/95 (BP Location: Right Arm)   Pulse 80   Temp 98.1 F (36.7 C) (Oral)   Resp 16   LMP 03/06/2013 (LMP Unknown)   SpO2 92%   Physical Exam Vitals and nursing note reviewed.  Constitutional:      General: She is not in acute distress.    Appearance: She is well-developed. She is not ill-appearing, toxic-appearing or diaphoretic.  HENT:     Head: Normocephalic and atraumatic.     Nose: No congestion or rhinorrhea.     Mouth/Throat:     Mouth: Mucous membranes are moist.     Pharynx: No oropharyngeal exudate or posterior oropharyngeal erythema.  Eyes:     Extraocular Movements: Extraocular movements intact.     Conjunctiva/sclera: Conjunctivae normal.     Pupils: Pupils are equal, round, and reactive to light.  Cardiovascular:     Rate and Rhythm: Normal rate and regular rhythm.     Heart sounds: No murmur heard. Pulmonary:     Effort: Pulmonary effort is normal. No respiratory distress.     Breath sounds: Normal breath sounds. No wheezing, rhonchi or rales.  Chest:     Chest wall: No tenderness.  Abdominal:     General: Abdomen is flat.     Palpations: Abdomen is soft.     Tenderness: There is no abdominal tenderness. There is no right CVA tenderness, left CVA tenderness, guarding or rebound.  Genitourinary:   Musculoskeletal:        General: Tenderness present.     Cervical back: Neck supple. No tenderness.  Right  lower leg: No edema.     Left lower leg: No edema.  Skin:    General: Skin is warm and dry.     Capillary Refill: Capillary refill takes less than 2 seconds.     Coloration: Skin is not pale.     Findings: Erythema and rash present.  Neurological:     General: No focal deficit present.     Mental Status: She is alert.     Sensory: No sensory deficit.     Motor: No weakness.  Psychiatric:        Mood and Affect: Mood normal.    ED Results / Procedures / Treatments   Labs (all labs ordered are listed, but only abnormal results are displayed) Labs Reviewed  CBC WITH DIFFERENTIAL/PLATELET - Abnormal; Notable for the following components:      Result Value   Hemoglobin 10.7 (*)    HCT 34.6 (*)    RDW 21.1 (*)    All other components within normal limits  COMPREHENSIVE METABOLIC PANEL - Abnormal; Notable for the following components:   Sodium 133 (*)    Potassium 3.1 (*)    Chloride 91 (*)    Glucose, Bld 128 (*)    BUN 33 (*)    Creatinine, Ser 3.31 (*)    Calcium 8.8 (*)    Albumin 2.7 (*)    GFR, Estimated 15 (*)    All other components within normal limits  LACTIC ACID, PLASMA - Abnormal; Notable for the following components:   Lactic Acid, Venous 2.4 (*)    All other components within normal limits  RESP PANEL BY RT-PCR (FLU A&B, COVID) [id number]  URINE CULTURE  CULTURE, BLOOD (ROUTINE X 2)  CULTURE, BLOOD (ROUTINE X 2)  LIPASE, BLOOD  LACTIC ACID, PLASMA  URINALYSIS, ROUTINE W REFLEX MICROSCOPIC    EKG EKG Interpretation  Date/Time:  Saturday February 16 2021 19:17:49 EDT Ventricular Rate:  81 PR Interval:  147 QRS Duration: 153 QT Interval:  433 QTC Calculation: 503 R Axis:   133 Text Interpretation: Sinus rhythm RBBB and LPFB When compared to prior, similar appearance No STEMI Confirmed by Antony Blackbird [phone]) on 02/16/2021 10:04:24 PM  Radiology DG Chest Portable 1 View  Result Date: 02/16/2021 CLINICAL DATA:  Patient missed dialysis.  Mild shortness of  breath. EXAM: PORTABLE CHEST 1 VIEW COMPARISON:  None. FINDINGS: Stable cardiomegaly. The hila and mediastinum are normal. No pneumothorax. No nodules or masses. No focal infiltrates. IMPRESSION: No active disease. Electronically Signed   By: Dorise Bullion III M.D.   On: 02/16/2021 19:10    Procedures Procedures   Medications Ordered in ED Medications  fentaNYL (SUBLIMAZE) injection 50 mcg (50 mcg Intravenous Given 02/16/21 1928)    ED Course  I have reviewed the triage vital signs and the nursing notes.  Pertinent labs & imaging results that were available during my care of the patient were reviewed by me and considered in my medical decision making (see chart for details).    MDM Rules/Calculators/A&P                           [name] Michael is a 60 y.o. female with a past medical history significant for ESRD on dialysis TTS, hypertension, diabetes, hyperlipidemia, CHF, previous UTI, ascites, and previous stroke who presents from dialysis for pain.  According to patient, she has had chronic pain with her rectum for quite some time but it  has worsened over the last few days.  She reports she has been having wounds around her rectum and she has been having difficult time keeping herself clean with defecation on them.  She says that this after starting dialysis today, the pain was so severe it was 10 out of 10 and she was unable to complete her dialysis treatment.  She only received about 30 minutes out of her 4 hours of dialysis.  She says that she does take ox intermittently at home and has been having some mild shortness of breath since arriving here.  She denies any chest pain or palpitations.  Denies any anterior abdominal pain or flank pain but does report some pain around her backside near her bottom.  Denies any urinary changes at this time.  Denies any constipation or diarrhea for me.  Pain is uncontrolled and on my initial evaluation, her oxygen saturations were dropping into the mid  80s despite being on the 3 L she has taken at home in the past.  On exam, lungs do have some faint crackles but no significant rhonchi.  Chest and abdomen were nontender.  With chaperone's, her rectum was examined and there is diffuse ulceration and wounds surrounding the rectum with surrounding erythema and tenderness.  I did not appreciate any fluctuance on exam.  There was stool intermixed with her wounds.  There are also some external hemorrhoids present.  No evidence of thrombosis on exam.  Exam otherwise unremarkable and patient is clearly uncomfortable.  Patient was seen in triage and had some basic lab work started.  We will add some other lab work including a chest x-ray and an EKG as she has missed dialysis today and had some more hypoxia than baseline.  She also complains of mild shortness of breath.  Based on her exam I do not suspect she has any large abscess or deep infection but it does appear there could be a cellulitis on these chronic rectal wounds.  Anticipate reassessment after work-up and pain medicine to determine disposition.  Patient's labs did not show evidence concerning for systemic bacterial infection at this time.  Her kidney function was elevated as expected but COVID and flu were negative.  No leukocytosis.  No lactic acidosis.  Still waiting on her urinalysis to be completed.  Clinically I am concerned about cellulitis on the skin around her rectum.  There was no evidence of perirectal or perianal abscess on exam.  Patient agrees with antibiotics for treatment however we have not found any concerning findings that would require emergent dialysis or admission at this time.  Patient agrees with outpatient management as she is not septic.  Care transferred to oncoming team while waiting for urinalysis to be completed.  If UTIs discovered, given her previous cultures, pharmacy would recommend a dose of fosfomycin tonight and then another dose in 72 hours.  Pharmacy also  recommended the Augmentin for treatment of the cellulitis with a dose to be taken in the evenings at bedtime for the next 7 days after dialysis.  Care transferred in stable condition.  Final Clinical Impression(s) / ED Diagnoses Final diagnoses:  Cellulitis of buttock  Pain, rectal  Dysuria     Clinical Impression: 1. Cellulitis of buttock   2. Pain, rectal   3. Dysuria     Disposition: Care transferred to oncoming team while waiting for urinalysis to be completed.  This note was prepared with assistance of Systems analyst. Occasional wrong-word or sound-a-like substitutions may have  occurred due to the inherent limitations of voice recognition software.     Keaja Reaume, Gwenyth Allegra, MD 02/17/21 [phone]

## 2021-02-16 NOTE — ED Notes (Signed)
Purewick placed on pt for ordered urinalysis/culture

## 2021-02-17 DIAGNOSIS — L8990 Pressure ulcer of unspecified site, unspecified stage: Secondary | ICD-10-CM | POA: Insufficient documentation

## 2021-02-17 LAB — URINALYSIS, ROUTINE W REFLEX MICROSCOPIC
Bilirubin Urine: NEGATIVE
Glucose, UA: 50 mg/dL — AB
Hgb urine dipstick: NEGATIVE
Ketones, ur: 5 mg/dL — AB
Nitrite: NEGATIVE
Protein, ur: 100 mg/dL — AB
Specific Gravity, Urine: 1.011 (ref 1.005–1.030)
WBC, UA: >50 WBC/hpf — ABNORMAL HIGH (ref 0–5)
pH: 8 (ref 5.0–8.0)

## 2021-02-17 MED ORDER — FOSFOMYCIN TROMETHAMINE 3 G PO PACK
3.0000 g | PACK | Freq: Once | ORAL | Status: AC
  Administered 2021-02-17: 3 g via ORAL
  Filled 2021-02-17: qty 3

## 2021-02-17 MED ORDER — FOSFOMYCIN TROMETHAMINE 3 G PO PACK: 3.0000 g | PACK | Freq: Once | ORAL | 0 refills | Status: DC

## 2021-02-17 MED ORDER — AMOXICILLIN-POT CLAVULANATE 875-125 MG PO TABS: 1.0000 | ORAL_TABLET | Freq: Every day | ORAL | 0 refills | Status: DC

## 2021-02-17 NOTE — ED Notes (Signed)
Discharge report successfully called to [facility] facility

## 2021-02-17 NOTE — ED Provider Notes (Signed)
Patient signed out to me by Dr. Sherry Ruffing to follow-up on urinalysis.  There was great difficulty in obtaining the urine for testing.  It has resulted with signs of infection.  Treatment plan generated by Dr. Sherry Ruffing was to administer fosfomycin and discharge patient with prescription for Augmentin and repeat dose of fosfomycin in 3 days.   Orpah Greek, MD 02/17/21 [phone]

## 2021-02-17 NOTE — ED Notes (Signed)
E-signature pad unavailable at time of pt discharge. This RN discussed discharge materials with pt and answered all pt questions. Pt stated understanding of discharge material. ? ?

## 2021-02-17 NOTE — ED Notes (Signed)
Pt oxygen level at 84%. Pt found with nasal cannula self-removed. Nasal cannula placed back under nares & pt educated on importance of keeping her nasal cannula in place. Oxygen increased to 92%.

## 2021-02-17 NOTE — Discharge Instructions (Addendum)
Your history, exam, work-up today is consistent with a cellulitis around your rectum.  There was no evidence of abscess or deeper infection on exam.  Your labs are otherwise reassuring.  Please take the Augmentin daily in the evenings after dialysis to help treat the cellulitis.  I spoke with pharmacy who help create this plan.  Please follow-up with your primary doctor and your nephrologist for dialysis.  We did not find any emergent abnormalities that would require dialysis tonight.  If any symptoms change or worsen, please return to the nearest emergency department.

## 2021-02-17 NOTE — ED Notes (Signed)
Pt given sandwich bag 

## 2021-02-17 NOTE — ED Notes (Signed)
RN attempted to call [facility] facility for pt's discharge report. Phone rang until disconnecting & beeping.

## 2021-02-18 ENCOUNTER — Inpatient Hospital Stay (HOSPITAL_COMMUNITY)
Admission: EM | Admit: 2021-02-18 | Discharge: 2021-02-22 | DRG: 189 | Disposition: A | Discharge status: Skilled Nursing Facility | Payer: Medicare Other | Source: Skilled Nursing Facility | Attending: Internal Medicine | Admitting: Internal Medicine

## 2021-02-18 ENCOUNTER — Emergency Department (HOSPITAL_COMMUNITY): Payer: Medicare Other

## 2021-02-18 ENCOUNTER — Encounter (HOSPITAL_COMMUNITY): Payer: Self-pay | Admitting: Emergency Medicine

## 2021-02-18 ENCOUNTER — Other Ambulatory Visit: Payer: Self-pay

## 2021-02-18 DIAGNOSIS — F17210 Nicotine dependence, cigarettes, uncomplicated: Secondary | ICD-10-CM | POA: Diagnosis present

## 2021-02-18 DIAGNOSIS — Z66 Do not resuscitate: Secondary | ICD-10-CM | POA: Diagnosis present

## 2021-02-18 DIAGNOSIS — N186 End stage renal disease: Secondary | ICD-10-CM | POA: Diagnosis not present

## 2021-02-18 DIAGNOSIS — R21 Rash and other nonspecific skin eruption: Secondary | ICD-10-CM | POA: Diagnosis present

## 2021-02-18 DIAGNOSIS — N2581 Secondary hyperparathyroidism of renal origin: Secondary | ICD-10-CM | POA: Diagnosis present

## 2021-02-18 DIAGNOSIS — Z992 Dependence on renal dialysis: Secondary | ICD-10-CM

## 2021-02-18 DIAGNOSIS — E118 Type 2 diabetes mellitus with unspecified complications: Secondary | ICD-10-CM

## 2021-02-18 DIAGNOSIS — I10 Essential (primary) hypertension: Secondary | ICD-10-CM | POA: Diagnosis present

## 2021-02-18 DIAGNOSIS — K219 Gastro-esophageal reflux disease without esophagitis: Secondary | ICD-10-CM | POA: Diagnosis present

## 2021-02-18 DIAGNOSIS — E559 Vitamin D deficiency, unspecified: Secondary | ICD-10-CM | POA: Diagnosis present

## 2021-02-18 DIAGNOSIS — Z885 Allergy status to narcotic agent status: Secondary | ICD-10-CM

## 2021-02-18 DIAGNOSIS — Z6822 Body mass index (BMI) 22.0-22.9, adult: Secondary | ICD-10-CM

## 2021-02-18 DIAGNOSIS — I5033 Acute on chronic diastolic (congestive) heart failure: Secondary | ICD-10-CM | POA: Diagnosis present

## 2021-02-18 DIAGNOSIS — Z823 Family history of stroke: Secondary | ICD-10-CM

## 2021-02-18 DIAGNOSIS — M810 Age-related osteoporosis without current pathological fracture: Secondary | ICD-10-CM | POA: Diagnosis present

## 2021-02-18 DIAGNOSIS — N390 Urinary tract infection, site not specified: Secondary | ICD-10-CM | POA: Diagnosis present

## 2021-02-18 DIAGNOSIS — E785 Hyperlipidemia, unspecified: Secondary | ICD-10-CM | POA: Diagnosis present

## 2021-02-18 DIAGNOSIS — F32A Depression, unspecified: Secondary | ICD-10-CM | POA: Diagnosis present

## 2021-02-18 DIAGNOSIS — IMO0002 Reserved for concepts with insufficient information to code with codable children: Secondary | ICD-10-CM | POA: Diagnosis present

## 2021-02-18 DIAGNOSIS — B9689 Other specified bacterial agents as the cause of diseases classified elsewhere: Secondary | ICD-10-CM | POA: Diagnosis present

## 2021-02-18 DIAGNOSIS — R4182 Altered mental status, unspecified: Secondary | ICD-10-CM

## 2021-02-18 DIAGNOSIS — E039 Hypothyroidism, unspecified: Secondary | ICD-10-CM | POA: Diagnosis present

## 2021-02-18 DIAGNOSIS — J9621 Acute and chronic respiratory failure with hypoxia: Secondary | ICD-10-CM | POA: Diagnosis not present

## 2021-02-18 DIAGNOSIS — G929 Unspecified toxic encephalopathy: Secondary | ICD-10-CM | POA: Diagnosis present

## 2021-02-18 DIAGNOSIS — I132 Hypertensive heart and chronic kidney disease with heart failure and with stage 5 chronic kidney disease, or end stage renal disease: Secondary | ICD-10-CM | POA: Diagnosis present

## 2021-02-18 DIAGNOSIS — Z20822 Contact with and (suspected) exposure to covid-19: Secondary | ICD-10-CM | POA: Diagnosis present

## 2021-02-18 DIAGNOSIS — D638 Anemia in other chronic diseases classified elsewhere: Secondary | ICD-10-CM | POA: Diagnosis not present

## 2021-02-18 DIAGNOSIS — E1165 Type 2 diabetes mellitus with hyperglycemia: Secondary | ICD-10-CM

## 2021-02-18 DIAGNOSIS — Z79899 Other long term (current) drug therapy: Secondary | ICD-10-CM

## 2021-02-18 DIAGNOSIS — E1122 Type 2 diabetes mellitus with diabetic chronic kidney disease: Secondary | ICD-10-CM | POA: Diagnosis present

## 2021-02-18 DIAGNOSIS — Z841 Family history of disorders of kidney and ureter: Secondary | ICD-10-CM

## 2021-02-18 DIAGNOSIS — R188 Other ascites: Secondary | ICD-10-CM | POA: Diagnosis present

## 2021-02-18 DIAGNOSIS — B952 Enterococcus as the cause of diseases classified elsewhere: Secondary | ICD-10-CM | POA: Diagnosis present

## 2021-02-18 DIAGNOSIS — E43 Unspecified severe protein-calorie malnutrition: Secondary | ICD-10-CM | POA: Diagnosis present

## 2021-02-18 DIAGNOSIS — Z7982 Long term (current) use of aspirin: Secondary | ICD-10-CM

## 2021-02-18 DIAGNOSIS — D696 Thrombocytopenia, unspecified: Secondary | ICD-10-CM | POA: Diagnosis present

## 2021-02-18 DIAGNOSIS — L03317 Cellulitis of buttock: Secondary | ICD-10-CM | POA: Diagnosis present

## 2021-02-18 DIAGNOSIS — Z79890 Hormone replacement therapy (postmenopausal): Secondary | ICD-10-CM

## 2021-02-18 DIAGNOSIS — Z833 Family history of diabetes mellitus: Secondary | ICD-10-CM

## 2021-02-18 DIAGNOSIS — Z8673 Personal history of transient ischemic attack (TIA), and cerebral infarction without residual deficits: Secondary | ICD-10-CM

## 2021-02-18 LAB — COMPREHENSIVE METABOLIC PANEL
ALT: 12 U/L (ref 0–44)
AST: 26 U/L (ref 15–41)
Albumin: 2.8 g/dL — ABNORMAL LOW (ref 3.5–5.0)
Alkaline Phosphatase: 109 U/L (ref 38–126)
Anion gap: 16 — ABNORMAL HIGH (ref 5–15)
BUN: 47 mg/dL — ABNORMAL HIGH (ref 6–20)
CO2: 29 mmol/L (ref 22–32)
Calcium: 9 mg/dL (ref 8.9–10.3)
Chloride: 89 mmol/L — ABNORMAL LOW (ref 98–111)
Creatinine, Ser: 4.86 mg/dL — ABNORMAL HIGH (ref 0.44–1.00)
GFR, Estimated: 10 mL/min — ABNORMAL LOW (ref >60)
Glucose, Bld: 124 mg/dL — ABNORMAL HIGH (ref 70–99)
Potassium: 3.4 mmol/L — ABNORMAL LOW (ref 3.5–5.1)
Sodium: 134 mmol/L — ABNORMAL LOW (ref 135–145)
Total Bilirubin: 0.9 mg/dL (ref 0.3–1.2)
Total Protein: 7 g/dL (ref 6.5–8.1)

## 2021-02-18 LAB — I-STAT VENOUS BLOOD GAS, ED
Acid-Base Excess: 5 mmol/L — ABNORMAL HIGH (ref 0.0–2.0)
Bicarbonate: 30.9 mmol/L — ABNORMAL HIGH (ref 20.0–28.0)
Calcium, Ion: 0.98 mmol/L — ABNORMAL LOW (ref 1.15–1.40)
HCT: 39 % (ref 36.0–46.0)
Hemoglobin: 13.3 g/dL (ref 12.0–15.0)
O2 Saturation: 98 %
Potassium: 3.4 mmol/L — ABNORMAL LOW (ref 3.5–5.1)
Sodium: 133 mmol/L — ABNORMAL LOW (ref 135–145)
TCO2: 32 mmol/L (ref 22–32)
pCO2, Ven: 50.3 mmHg (ref 44.0–60.0)
pH, Ven: 7.396 (ref 7.250–7.430)
pO2, Ven: 115 mmHg — ABNORMAL HIGH (ref 32.0–45.0)

## 2021-02-18 LAB — CBC WITH DIFFERENTIAL/PLATELET
Abs Immature Granulocytes: 0.02 10*3/uL (ref 0.00–0.07)
Basophils Absolute: 0.1 10*3/uL (ref 0.0–0.1)
Basophils Relative: 1 %
Eosinophils Absolute: 0.2 10*3/uL (ref 0.0–0.5)
Eosinophils Relative: 2 %
HCT: 37 % (ref 36.0–46.0)
Hemoglobin: 11.2 g/dL — ABNORMAL LOW (ref 12.0–15.0)
Immature Granulocytes: 0 %
Lymphocytes Relative: 12 %
Lymphs Abs: 0.8 10*3/uL (ref 0.7–4.0)
MCH: 27.7 pg (ref 26.0–34.0)
MCHC: 30.3 g/dL (ref 30.0–36.0)
MCV: 91.4 fL (ref 80.0–100.0)
Monocytes Absolute: 0.6 10*3/uL (ref 0.1–1.0)
Monocytes Relative: 10 %
Neutro Abs: 4.7 10*3/uL (ref 1.7–7.7)
Neutrophils Relative %: 75 %
Platelets: 214 10*3/uL (ref 150–400)
RBC: 4.05 MIL/uL (ref 3.87–5.11)
RDW: 20.8 % — ABNORMAL HIGH (ref 11.5–15.5)
WBC: 6.3 10*3/uL (ref 4.0–10.5)
nRBC: 0 % (ref 0.0–0.2)

## 2021-02-18 LAB — RESP PANEL BY RT-PCR (FLU A&B, COVID) ARPGX2
Influenza A by PCR: NEGATIVE
Influenza B by PCR: NEGATIVE
SARS Coronavirus 2 by RT PCR: NEGATIVE

## 2021-02-18 LAB — AMMONIA: Ammonia: 31 umol/L (ref 9–35)

## 2021-02-18 LAB — CBG MONITORING, ED: Glucose-Capillary: 122 mg/dL — ABNORMAL HIGH (ref 70–99)

## 2021-02-18 MED ORDER — RENA-VITE PO TABS
1.0000 | ORAL_TABLET | Freq: Every day | ORAL | Status: DC
  Administered 2021-02-19 – 2021-02-22 (×4): 1 via ORAL
  Filled 2021-02-18 (×5): qty 1

## 2021-02-18 MED ORDER — SODIUM CHLORIDE 0.9% FLUSH
3.0000 mL | Freq: Two times a day (BID) | INTRAVENOUS | Status: DC
  Administered 2021-02-19 – 2021-02-22 (×9): 3 mL via INTRAVENOUS

## 2021-02-18 MED ORDER — IOHEXOL 350 MG/ML SOLN
75.0000 mL | Freq: Once | INTRAVENOUS | Status: AC | PRN
  Administered 2021-02-18: 75 mL via INTRAVENOUS

## 2021-02-18 MED ORDER — HEPARIN SODIUM (PORCINE) 5000 UNIT/ML IJ SOLN
5000.0000 [IU] | Freq: Three times a day (TID) | INTRAMUSCULAR | Status: DC
  Administered 2021-02-19 – 2021-02-22 (×11): 5000 [IU] via SUBCUTANEOUS
  Filled 2021-02-18 (×11): qty 1

## 2021-02-18 MED ORDER — ACETAMINOPHEN 650 MG RE SUPP: 650.0000 mg | Freq: Four times a day (QID) | RECTAL | Status: DC | PRN

## 2021-02-18 MED ORDER — ACETAMINOPHEN 325 MG PO TABS
650.0000 mg | ORAL_TABLET | Freq: Four times a day (QID) | ORAL | Status: DC | PRN
  Administered 2021-02-19 – 2021-02-20 (×2): 650 mg via ORAL
  Filled 2021-02-18 (×2): qty 2

## 2021-02-18 MED ORDER — POLYETHYLENE GLYCOL 3350 17 G PO PACK: 17.0000 g | PACK | Freq: Every day | ORAL | Status: DC | PRN

## 2021-02-18 MED ORDER — SODIUM CHLORIDE 0.9 % IV SOLN
2.0000 g | Freq: Once | INTRAVENOUS | Status: AC
  Administered 2021-02-18: 2 g via INTRAVENOUS
  Filled 2021-02-18: qty 2

## 2021-02-18 NOTE — H&P (Signed)
History and Physical   NAYARA BRY [id number] DOB: [birth year] DOA: 02/18/2021  PCP: Charlott Rakes, MD   Patient coming from: [facility]  Chief Complaint: Hypoxia and altered mental status  HPI: [name] Michael is a 60 y.o. female with medical history significant of ESRD on HD TTS, CVA, hyperlipidemia, hypertension, diabetes, CHF, anemia, hypothyroidism, depression, GERD, prolonged QT, syncope, cytopenia who presents from her facility with hypoxia and altered mental status.  History obtained with chart review. EMS was called out to her facility due to worsening hypoxia.  She typically is on 3 L at baseline but was requiring 4 L to maintain saturations but still have borderline values.  On EMS arrival saturating in the 90s on 4 L.  Had received an albuterol treatment.  Also noticed increased lethargy.  She was started on Augmentin in the last couple days for a UTI per her report.  She stated that she had been having some pain when she pees but this seems to be getting better.  Patient states that other than her shortness of breath she has been feeling okay.  She states that she had her last full dialysis session on Thursday and only completed 30 minutes of dialysis on Saturday because she was becoming uncomfortable in the dialysis chair.  She denies fevers, chills, chest pain, abdominal pain, constipation, diarrhea, nausea, vomiting, urinary symptoms.  ED Course: Vital signs in the ED significant for blood pressure in the [id number] to [id number] systolic with increasing oxygen demand from 4 on arrival to the 5-8 while in the ED.  Lab work-up showed CMP with sodium stable 134, chloride 89, potassium 3.4, bicarb normal, gap 16, BUN stable 47, creatinine stable at 4.8, glucose 124, 2.8.  CBC with hemoglobin stable 11.2.  Respiratory panel for flu and COVID-negative, urinalysis and urine culture pending.  Ammonia level normal.  Chest x-ray with cardiomegaly, CT head with chronic ischemic  changes but no acute normalities.  CT a PE study was negative for PE did show cardiomegaly and small bilateral effusions with atelectasis unable to rule out pneumonia, small ascites also noted in the upper abdomen.  Given dose of cefepime in the ED.  Review of Systems: As per HPI otherwise all other systems reviewed and are negative.  Past Medical History:  Diagnosis Date   Ambulates with cane    Constipation    Depression    ESRD (end stage renal disease) ([place])    a. TTS Dialysis.   GERD (gastroesophageal reflux disease)    History of stress test    a. 01/2003 MV: EF 74%, no ischemia/infarct.   Hyperlipidemia    Hypertension    Osteoporosis    Stroke [facility]) 04-01-11   left frontal subcortical, saw Dr. [name]    Syncope 11/2019   TIA (transient ischemic attack) 03-12-11   Tobacco abuse    Tricuspid regurgitation    a. 05/2016 Echo: EF 65-70%, Gr2DD, mild MR, nl RV fxn, Triv TR, PASP 106mHg; b. 05/2018 Echo: EF 60-65%, Gr2DD, mild MR/TR, RVSP/PASP 375mg; c. 11/2019 Echo: EF 55-60%, no rwma, mild-mod MR, Sev TR w/ RV dilatation (CTA chest neg for PE).   [place] II [place]   Vitamin D deficiency     Past Surgical History:  Procedure Laterality Date   AV FISTULA PLACEMENT Left 02/21/2019   Procedure: BRACHIOCEPHALIC ARTERIOVENOUS (AV) FISTULA CREATION;  Surgeon: ClMarty HeckMD;  [place] [facility] [place]: [place];  [place]ality: Left;   [place]  [place] 04/[place]/2019   Procedure: SECOND STAGE BASILIC VEIN TRANSPOSITION LEFT ARM;  Surgeon: [name], MD;  Location: [place];  Service: Vascular;  Laterality: Left;   IR FLUORO GUIDE CV LINE RIGHT  04/29/2019   IR US GUIDE VASC ACCESS RIGHT  04/29/2019   OPEN REDUCTION INTERNAL FIXATION (ORIF) DISTAL RADIAL FRACTURE Left 01/28/2016   Procedure: OPEN REDUCTION INTERNAL FIXATION (ORIF) DISTAL RADIAL FRACTURE;  Surgeon: Iran Planas, MD;  Location: [place];  Service: Orthopedics;  Laterality: Left;    [place]   RIGHT HEART CATH N/A 12/05/2019   Procedure: RIGHT HEART CATH;  Surgeon: Nelva Bush, MD;  Location: [place] CV [facility];  Service: Cardiovascular;  Laterality: N/A;   TONSILLECTOMY AND ADENOIDECTOMY     age 44    Social History  reports that she has been smoking cigarettes. She has a 9.60 pack-year smoking history. She has been exposed to tobacco smoke. She has never used smokeless tobacco. She reports that she does not drink alcohol and does not use drugs.  Allergies  Allergen Reactions   Hydrocodone Nausea And Vomiting and Other (See Comments)    "Allergic," per [facility]    Family History  Problem Relation Age of Onset   Stroke Mother    Diabetes Mother    Kidney failure Mother    Heart failure Mother    Stroke Father    Cancer Sister        Breast- 23's   Colon cancer Maternal Grandmother    Pancreatic cancer Neg Hx    Esophageal cancer Neg Hx    Liver disease Neg Hx    Stomach cancer Neg Hx   Reviewed on admission  Prior to Admission medications   Medication Sig Start Date End Date Taking? Authorizing Provider  Amino Acids-Protein Hydrolys (FEEDING SUPPLEMENT, PRO-STAT SUGAR FREE 64,) LIQD Take 30 mLs by mouth in the morning. WILD CHERRY FLAVOR    [provider]  amoxicillin-clavulanate (AUGMENTIN) 875-125 MG tablet Take 1 tablet by mouth at bedtime for 7 days. 02/17/21 02/24/21  Tegeler, Gwenyth Allegra, MD  aspirin 81 MG chewable tablet Chew 81 mg by mouth daily.    [provider]  atorvastatin (LIPITOR) 40 MG tablet Take 40 mg by mouth at bedtime.     [provider]  b complex-vitamin c-folic acid (NEPHRO-VITE) 0.8 MG TABS tablet Take 1 tablet by mouth at bedtime.    [provider]  carvedilol (COREG) 3.125 MG tablet Take 1 tablet (3.125 mg total) by mouth 2 (two) times daily with a meal. 02/01/21   Regalado, [name], MD  cloNIDine (CATAPRES) 0.1 MG tablet Take 0.1 mg by mouth every 8  (eight) hours as needed (BP>160/90).    [provider]  FLUoxetine (PROZAC) 20 MG capsule Take 20 mg by mouth daily. 12/21/20   [provider]  fosfomycin (MONUROL) 3 g PACK Take 3 g by mouth once for 1 dose. Take this medication on Wednesday August 31. 02/20/21 02/20/21  Orpah Greek, MD  levothyroxine (SYNTHROID) 75 MCG tablet Take 1 tablet (75 mcg total) by mouth daily at 6 (six) AM. 02/02/21   Regalado, [name], MD  Lido-PE-Glycerin-Petrolatum (PREPARATION H RAPID RELIEF EX) Apply topically every 6 (six) hours as needed (hemorrhoid).    [provider]  midodrine (PROAMATINE) 10 MG tablet Take 10 mg by mouth See admin instructions. Take 1 tablet (10 mg) by mouth prior to dialysis on Tuesday, Thursday, Saturday  [provider]  Nutritional Supplements (NOVASOURCE RENAL) LIQD Take 237 mLs by mouth with breakfast, with lunch, and with evening meal.    [provider]  omega-3 acid ethyl esters (LOVAZA) 1 g capsule Take 2 capsules by mouth 2 (two) times daily. 05/28/20   [provider]  omeprazole (PRILOSEC) 40 MG capsule Take 40 mg by mouth daily before breakfast.     [provider]  ondansetron (ZOFRAN) 4 MG tablet Take 4 mg by mouth every 8 (eight) hours as needed for nausea or vomiting.    [provider]  polyethylene glycol powder (GLYCOLAX/MIRALAX) 17 GM/SCOOP powder Take 17 g by mouth See admin instructions. Mix 17 grams of powder into 8 ounces of fluid, stir, and drink (by mouth) once a day    [provider]  SACCHAROMYCES BOULARDII PO Take 1 capsule by mouth 2 (two) times daily.    [provider]  senna-docusate (SENOKOT-S) 8.6-50 MG tablet Take 1 tablet by mouth at bedtime as needed for mild constipation. 06/11/18   Regalado, [name], MD  sodium bicarbonate 650 MG tablet Take 1,300 mg by mouth 2 (two) times daily.    [provider]    Physical Exam: Vitals:   02/18/21 2200  02/18/21 2215 02/18/21 2330 02/18/21 2346  BP: (!) 157/73 135/75 (!) 178/87   Pulse: 67 68 66   Resp: '13 15 12   '$ Temp:      TempSrc:      SpO2: 90% 90% 93% 92%   Physical Exam Constitutional:      General: She is not in acute distress.    Appearance: Normal appearance.  HENT:     Head: Normocephalic and atraumatic.     Mouth/Throat:     Mouth: Mucous membranes are moist.     Pharynx: Oropharynx is clear.  Eyes:     Extraocular Movements: Extraocular movements intact.     Pupils: Pupils are equal, round, and reactive to light.  Cardiovascular:     Rate and Rhythm: Normal rate and regular rhythm.     Pulses: Normal pulses.     Heart sounds: Normal heart sounds.  Pulmonary:     Effort: Pulmonary effort is normal. No respiratory distress.     Breath sounds: Rales present.     Comments: Mild increased work of breathing.  Tachypnea. Abdominal:     General: Bowel sounds are normal. There is no distension.     Palpations: Abdomen is soft.     Tenderness: There is no abdominal tenderness.  Musculoskeletal:        General: No swelling or deformity.     Right lower leg: No edema.     Left lower leg: No edema.  Skin:    General: Skin is warm and dry.  Neurological:     General: No focal deficit present.     Mental Status: Mental status is at baseline.     Comments: Drowsy, but alert and oriented and appropriate.   Labs on Admission: I have personally reviewed following labs and imaging studies  CBC: Recent Labs  Lab 02/12/21 1108 02/16/21 1439 02/18/21 2017 02/18/21 2035  WBC 6.6 7.1 6.3  --   NEUTROABS 4.9 5.9 4.7  --   HGB 10.2* 10.7* 11.2* 13.3  HCT 32.5* 34.6* 37.0 39.0  MCV 86.9 88.5 91.4  --   PLT 307 251 214  --     Basic Metabolic Panel: Recent Labs  Lab 02/12/21 1108 02/16/21 1439 02/18/21 2017  02/18/21 2035  NA 130* 133* 134* 133*  K 3.3* 3.1* 3.4* 3.4*  CL 88* 91* 89*  --   CO2 '28 30 29  '$ --   GLUCOSE 111* 128* 124*  --   BUN 51* 33* 47*  --    CREATININE 4.61* 3.31* 4.86*  --   CALCIUM 8.9 8.8* 9.0  --     GFR: CrCl cannot be calculated (Unknown ideal weight.).  Liver Function Tests: Recent Labs  Lab 02/12/21 1108 02/16/21 1439 02/18/21 2017  AST '23 26 26  '$ ALT '13 14 12  '$ ALKPHOS 89 101 109  BILITOT 0.9 1.0 0.9  PROT 6.4* 6.9 7.0  ALBUMIN 2.6* 2.7* 2.8*    Urine analysis:    Component Value Date/Time   COLORURINE YELLOW 02/17/2021 0322   APPEARANCEUR HAZY (A) 02/17/2021 0322   LABSPEC 1.011 02/17/2021 0322   PHURINE 8.0 02/17/2021 0322   GLUCOSEU 50 (A) 02/17/2021 0322   HGBUR NEGATIVE 02/17/2021 0322   BILIRUBINUR NEGATIVE 02/17/2021 0322   BILIRUBINUR n 05/25/2012 1202   KETONESUR 5 (A) 02/17/2021 0322   PROTEINUR 100 (A) 02/17/2021 0322   UROBILINOGEN 0.2 05/25/2012 1202   UROBILINOGEN 0.2 03/29/2011 0131   NITRITE NEGATIVE 02/17/2021 0322   LEUKOCYTESUR SMALL (A) 02/17/2021 0322    Radiological Exams on Admission: CT HEAD WO CONTRAST  Result Date: 02/18/2021 CLINICAL DATA:  Encephalopathy EXAM: CT HEAD WITHOUT CONTRAST TECHNIQUE: Contiguous axial images were obtained from the base of the skull through the vertex without intravenous contrast. COMPARISON:  None. FINDINGS: Brain: There is no mass, hemorrhage or extra-axial collection. There is generalized atrophy without lobar predilection. Hypodensity of the white matter is most commonly associated with chronic microvascular disease. There are old small vessel infarcts of the basal ganglia and thalamus. Vascular: No abnormal hyperdensity of the major intracranial arteries or dural venous sinuses. No intracranial atherosclerosis. Skull: The visualized skull base, calvarium and extracranial soft tissues are normal. Sinuses/Orbits: No fluid levels or advanced mucosal thickening of the visualized paranasal sinuses. No mastoid or middle ear effusion. The orbits are normal. IMPRESSION: 1. No acute intracranial abnormality. 2. Atrophy and chronic microvascular  ischemia. 3. Old small vessel infarcts of the basal ganglia and thalamus. Electronically Signed   By: Ulyses Jarred M.D.   On: 02/18/2021 20:57   CT Angio Chest PE W and/or Wo Contrast  Result Date: 02/18/2021 CLINICAL DATA:  Concern for pulmonary embolism.  Hypoxia. EXAM: CT ANGIOGRAPHY CHEST WITH CONTRAST TECHNIQUE: Multidetector CT imaging of the chest was performed using the standard protocol during bolus administration of intravenous contrast. Multiplanar CT image reconstructions and MIPs were obtained to evaluate the vascular anatomy. CONTRAST:  27m OMNIPAQUE IOHEXOL 350 MG/ML SOLN COMPARISON:  CT dated 01/25/2021. FINDINGS: Cardiovascular: Moderate cardiomegaly. No pericardial effusion. There is retrograde flow of contrast from the right atrium into the IVC consistent with right heart dysfunction. Moderate atherosclerotic calcification of the thoracic aorta. No aneurysmal dilatation. Non opacification of a small left upper lobe segmental branch (series 7 images 91-101) likely artifactual. A nonocclusive pulmonary artery embolus within this branch is less likely. No other pulmonary artery embolus identified. Mediastinum/Nodes: No hilar or mediastinal adenopathy. The esophagus is grossly unremarkable. Mediastinal fluid collection. Lungs/Pleura: Small bilateral pleural effusions, right greater than left. There is associated partial compressive atelectasis of the lower lobes. Pneumonia is not excluded clinical correlation is recommended. No pneumothorax. The central airways are patent. Upper Abdomen: Small ascites. Musculoskeletal: Diffuse subcutaneous edema and anasarca. Osteopenia. No acute osseous pathology. Old  sternal fracture. Review of the MIP images confirms the above findings. IMPRESSION: 1. No definite CT evidence of pulmonary embolism. 2. Moderate cardiomegaly with evidence of right heart dysfunction. 3. Small bilateral pleural effusions, right greater than left, with associated partial  compressive atelectasis of the lower lobes. Pneumonia is not excluded clinical correlation is recommended. 4. Small ascites and anasarca. 5. Aortic Atherosclerosis (ICD10-I70.0). Electronically Signed   By: Anner Crete M.D.   On: 02/18/2021 23:14   DG Chest Port 1 View  Result Date: 02/18/2021 CLINICAL DATA:  Lethargy. EXAM: PORTABLE CHEST 1 VIEW COMPARISON:  Chest radiograph dated 02/16/2021. FINDINGS: Faint right lung base density, likely atelectasis. No focal consolidation pleural effusion or pneumothorax. Cardiomegaly. No acute pathology. IMPRESSION: 1. No acute cardiopulmonary process. 2. Cardiomegaly. Electronically Signed   By: Anner Crete M.D.   On: 02/18/2021 20:26    EKG: Independently reviewed.  Sinus rhythm at 60 bpm.  Right bundle branch block.  T wave flattening in mild T wave inversion in multiple leads, nonspecific.  Similar to previous.  Assessment/Plan Principal Problem:   Acute on chronic respiratory failure with hypoxia (HCC) Active Problems:   Essential hypertension   Uncontrolled type 2 diabetes mellitus with complication (HCC)   Anemia of chronic disease   ESRD (end stage renal disease) (HCC)   GERD (gastroesophageal reflux disease)  Acute on chronic respiratory failure with hypoxia > EMS initially called out for hypoxia she is on 3 L chronically and had to be increased to 4 L.  She was saturating in the 90s on 4 L per EMS.  She had received albuterol treatment at facility. > Has had continued intermittent desaturations in the ED and has had her oxygen trend treated up to 6 to 8 L. > CTA Chest negative for PE, did show small bilateral effusions with atelectasis.   > Some residual lethargy for me however was alert and oriented on my exam.  Also noted to have rales on exam. - Monitor on progressive unit so that we will have heart level care for her increasing oxygen needs as needed. - We will hold off on further antibiotics for now as volume overload seems more  likely and she has no leukocytosis, will however check respiratory viral panel  - Lasix 40 mg IV as she states she still makes urine.  ESRD on HD > TTS HD schedule.  Only completed 30 minutes on Saturday due to discomfort.  Volume overloaded on exam. > Will need nephrology consult while she is here. > Creatinine stable at 4.8, BUN stable at 47, bicarb stable with gap of 16, potassium 3.4. - Nephrology consult in the morning for HD - Avoid nephrotoxic agents - Trend renal function electrolytes - Check mag and Phos - Will give 20 mEq p.o. potassium because we are also giving her IV Lasix.  CHF > Last echo was in April of this year EF 50-55%, indeterminate diastolic parameters, mildly reduced RV systolic function, small pericardial effusion -BNP pending, if elevated likely due to volume overload due to incomplete dialysis session. - Volume control with dialysis - Continue home Coreg  UTI > Per her report and records that accompanied her she seems to be on Augmentin due to recent urinary symptoms. > Urine studies ordered here when patient was more lethargic we will follow these up. - Continue outpatient Augmentin - Follow-up urinalysis and urine culture  History of CVA Hyperlipidemia - Continue home aspirin and atorvastatin  Hypothyroidism > Checking TSH as above - Continue home  Synthroid when alert  Hypertension - Continue home Coreg  - Hold as needed clonidine for now  Anemia > Hemoglobin stable at 11.2 - Continue trend CBC  Diabetes - SSI  GERD - Continue PPI  DVT prophylaxis: Heparin  Code Status:   DNR  Family Communication:  Sister updated by phone.  Disposition Plan:   Patient is from:  [place] to:  Same as above  Anticipated DC date:  1 to 3 days  Anticipated DC barriers: None  Consults called:  None  Admission status:  Observation, progressive   Severity of Illness: The appropriate patient status for this patient is  OBSERVATION. Observation status is judged to be reasonable and necessary in order to provide the required intensity of service to ensure the patient's safety. The patient's presenting symptoms, physical exam findings, and initial radiographic and laboratory data in the context of their medical condition is felt to place them at decreased risk for further clinical deterioration. Furthermore, it is anticipated that the patient will be medically stable for discharge from the hospital within 2 midnights of admission. The following factors support the patient status of observation.   " The patient's presenting symptoms include shortness of breath, altered mental status. " The physical exam findings include rales, mild increased work of breathing, drowsy. " The initial radiographic and laboratory data are  Lab work-up showed CMP with sodium stable 134, chloride 89, potassium 3.4, bicarb normal, gap 16, BUN stable 47, creatinine stable at 4.8, glucose 124, 2.8.  CBC with hemoglobin stable 11.2.  Respiratory panel for flu and COVID-negative, urinalysis and urine culture pending.  Ammonia level normal.  Chest x-ray with cardiomegaly, CT head with chronic ischemic changes but no acute normalities.  CT a PE study was negative for PE did show cardiomegaly and small bilateral effusions with atelectasis unable to rule out pneumonia, small ascites also noted in the upper abdomen.    Marcelyn Bruins MD [facility]  How to contact the [facility] Attending or Consulting provider [place] or covering provider during after hours [place], for this patient?   Check the care team in [facility] and look for a) attending/consulting TR[facility] provider listed and b) the [facility] team listed Log into [email or web address] and use [place]'s universal password to access. If you do not have the password, please contact the hospital operator. Locate the [facility] provider you are looking for under [facility] and page to a number that you can be  directly reached. If you still have difficulty reaching the provider, please page the [facility] (Director on Call) for the Hospitalists listed on amion for assistance.  02/19/2021, 12:50 AM

## 2021-02-18 NOTE — ED Triage Notes (Signed)
Patient BIB [facility] from [facility], EMS called out for low O2. Pt in 90s on EMS monitor, received 2.5 albuterol via facility. Patient 3L [place] @ baseline. Patient more lethargic than normal per facility.

## 2021-02-18 NOTE — ED Provider Notes (Addendum)
[facility] EMERGENCY DEPARTMENT Provider Note   CSN: [id number] Arrival date & time: 02/18/21  1802     History Chief Complaint  Patient presents with   Fatigue    [name] Michael is a 60 y.o. female with PMHx ESRD on dialysis TTS, hypertension, diabetes, hyperlipidemia, CHF, previous UTI, ascites, and previous stroke who presents for evaluation of altered mental status.  Patient is significantly encephalopathic so HPI is limited.  Patient arrives via EMS, who states that they were called out due to hypoxia.  Patient was reportedly saturating in the 90s, for which she was given albuterol without improvement.  Patient has chronic hypoxic respiratory failure on 3 L nasal cannula at baseline.  She was increased to 4L today in light of her hypoxia.  Her nursing facility also noted that she was more lethargic than normal.  EMS was called, the patient was subsequently transported to the emergency department for further evaluation.     Past Medical History:  Diagnosis Date   Ambulates with cane    Constipation    Depression    ESRD (end stage renal disease) ([place])    a. TTS Dialysis.   GERD (gastroesophageal reflux disease)    History of stress test    a. 01/2003 MV: EF 74%, no ischemia/infarct.   Hyperlipidemia    Hypertension    Osteoporosis    Stroke [facility]) 04-01-11   left frontal subcortical, saw Dr. [name]    Syncope 11/2019   TIA (transient ischemic attack) 03-12-11   Tobacco abuse    Tricuspid regurgitation    a. 05/2016 Echo: EF 65-70%, Gr2DD, mild MR, nl RV fxn, Triv TR, PASP 72mHg; b. 05/2018 Echo: EF 60-65%, Gr2DD, mild MR/TR, RVSP/PASP 366mg; c. 11/2019 Echo: EF 55-60%, no rwma, mild-mod MR, Sev TR w/ RV dilatation (CTA chest neg for PE).   Type II diabetes mellitus ([facility]   Vitamin D deficiency     Patient Active Problem List   Diagnosis Date Noted   Acute on chronic respiratory failure with hypoxia (HCUpper [place]08/29/2022   Pressure injury of skin  02/17/2021   Abdominal ascites 01/25/2021   Lymphocytopenia 10/13/2020   Ear hematoma, right, initial encounter 10/13/2020   Prolonged QT interval 10/13/2020   Lactic acidosis 10/05/2020   Transaminitis 10/05/2020   Thrombocytopenia (HCD[facility]04/15/2022   Acute respiratory failure with hypoxia (HCE[place]04/15/2022   Altered mental status 030000000 Acute metabolic encephalopathy [id number] Severe tricuspid regurgitation 12/04/2019   ESRD (end stage renal disease) ([facility]   GERD (gastroesophageal reflux disease)    Hypertension    Type II diabetes mellitus ([facility]   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   AMS (altered mental status) 04/28/2019   Hypoxemia 04/28/2019   Fall 06/09/2018   Fall at home, initial encounter 06/09/2018   Anemia of chronic disease 06/09/2018   Syncope and collapse 06/09/2018   Acute encephalopathy 03/10/2018   Hypermagnesemia 03/10/2018   Acute lower UTI 11/09/2016   Uncontrolled type 2 diabetes mellitus with complication ([facility]   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 associated with diabetes mellitus due to underlying condition (HCC)    Chronic diastolic CHF (congestive heart failure) ([facility]court[facility]   Hypokalemia 06/11/2016   Proliferative diabetic retinopathy (HCLaverne11/12/2015   Poor social situation 03/09/2013   Depression 03/01/2013   Abnormal mammogram 12/20/2012   Retinal detachment 11/17/2012   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 ([id number] Hyperlipidemia  02/09/2007   Essential hypertension 02/09/2007    Past Surgical History:  Procedure Laterality Date   AV FISTULA PLACEMENT Left 02/21/2019   Procedure: BRACHIOCEPHALIC ARTERIOVENOUS (AV) FISTULA CREATION;  Surgeon: [name], MD;  Location: [facility];  Service: Vascular;  Laterality: Left;   [place] Left 04/11/2019   Procedure: SECOND STAGE BASILIC VEIN TRANSPOSITION LEFT ARM;  Surgeon: [name], MD;  Location:  [facility];  Service: Vascular;  Laterality: Left;   IR FLUORO GUIDE CV LINE RIGHT  04/29/2019   IR US GUIDE VASC ACCESS RIGHT  04/29/2019   OPEN REDUCTION INTERNAL FIXATION (ORIF) DISTAL RADIAL FRACTURE Left 01/28/2016   Procedure: OPEN REDUCTION INTERNAL FIXATION (ORIF) DISTAL RADIAL FRACTURE;  Surgeon: Iran Planas, MD;  Location: [facility];  Service: Orthopedics;  Laterality: Left;   [place]   RIGHT HEART CATH N/A 12/05/2019   Procedure: RIGHT HEART CATH;  Surgeon: Nelva Bush, MD;  Location: [facility] CV [facility];  Service: Cardiovascular;  Laterality: N/A;   TONSILLECTOMY AND ADENOIDECTOMY     age 21     OB History   No obstetric history on file.     Family History  Problem Relation Age of Onset   Stroke Mother    Diabetes Mother    Kidney failure Mother    Heart failure Mother    Stroke Father    Cancer Sister        Breast- 50's   Colon cancer Maternal Grandmother    Pancreatic cancer Neg Hx    Esophageal cancer Neg Hx    Liver disease Neg Hx    Stomach cancer Neg Hx     Social History   Tobacco Use   Smoking status: Every Day    Packs/day: 0.30    Years: 32.00    Pack years: 9.60    Types: Cigarettes    Last attempt to quit: 09/23/2012    Years since quitting: 8.4    Passive exposure: Current   Smokeless tobacco: Never   Tobacco comments:    8 cigarettes/day  Vaping Use   Vaping Use: Never used  Substance Use Topics   Alcohol use: No   Drug use: No    Home Medications Prior to Admission medications   Medication Sig Start Date End Date Taking? Authorizing Provider  Amino Acids-Protein Hydrolys (FEEDING SUPPLEMENT, PRO-STAT SUGAR FREE 64,) LIQD Take 30 mLs by mouth in the morning. WILD CHERRY FLAVOR   Yes [provider]  amoxicillin-clavulanate (AUGMENTIN) 875-125 MG tablet Take 1 tablet by mouth at bedtime for 7 days. 02/17/21 02/24/21 Yes Tegeler, Gwenyth Allegra, MD  aspirin 81 MG chewable tablet Chew 81 mg by mouth  daily.   Yes [provider]  atorvastatin (LIPITOR) 40 MG tablet Take 40 mg by mouth at bedtime.    Yes [provider]  b complex-vitamin c-folic acid (NEPHRO-VITE) 0.8 MG TABS tablet Take 1 tablet by mouth at bedtime.   Yes [provider]  carvedilol (COREG) 3.125 MG tablet Take 1 tablet (3.125 mg total) by mouth 2 (two) times daily with a meal. 02/01/21  Yes Regalado, [name], MD  cloNIDine (CATAPRES) 0.1 MG tablet Take 0.1 mg by mouth every 8 (eight) hours as needed (BP>160/90).   Yes [provider]  FLUoxetine (PROZAC) 20 MG capsule Take 20 mg by mouth daily. 12/21/20  Yes [provider]  levothyroxine (SYNTHROID) 75 MCG tablet Take 1 tablet (75 mcg total)  by mouth daily at 6 (six) AM. 02/02/21  Yes Regalado, [name], MD  Lido-PE-Glycerin-Petrolatum (PREPARATION H RAPID RELIEF EX) Apply topically every 6 (six) hours as needed (hemorrhoid).   Yes [provider]  midodrine (PROAMATINE) 10 MG tablet Take 10 mg by mouth See admin instructions. Take 1 tablet (10 mg) by mouth prior to dialysis on Tuesday, Thursday, Saturday   Yes [provider]  Nutritional Supplements (NOVASOURCE RENAL) LIQD Take 237 mLs by mouth with breakfast, with lunch, and with evening meal.   Yes [provider]  omega-3 acid ethyl esters (LOVAZA) 1 g capsule Take 2 capsules by mouth 2 (two) times daily. 05/28/20  Yes [provider]  omeprazole (PRILOSEC) 40 MG capsule Take 40 mg by mouth daily before breakfast.    Yes [provider]  ondansetron (ZOFRAN) 4 MG tablet Take 4 mg by mouth every 8 (eight) hours as needed for nausea or vomiting.   Yes [provider]  polyethylene glycol powder (GLYCOLAX/MIRALAX) 17 GM/SCOOP powder Take 17 g by mouth See admin instructions. Mix 17 grams of powder into 8 ounces of fluid, stir, and drink (by mouth) once a day   Yes [provider]  SACCHAROMYCES BOULARDII PO Take 1 capsule by  mouth 2 (two) times daily.   Yes [provider]  senna-docusate (SENOKOT-S) 8.6-50 MG tablet Take 1 tablet by mouth at bedtime as needed for mild constipation. 06/11/18  Yes Regalado, [name], MD  sodium bicarbonate 650 MG tablet Take 1,300 mg by mouth 2 (two) times daily.   Yes [provider]  fosfomycin (MONUROL) 3 g PACK Take 3 g by mouth once for 1 dose. Take this medication on Wednesday August 31. 02/20/21 02/20/21  Orpah Greek, MD    Allergies    Hydrocodone  Review of Systems   Review of Systems  Unable to perform ROS: Mental status change   Physical Exam Updated Vital Signs BP (!) 177/70 (BP Location: Right Arm)   Pulse 72   Temp 98.2 F (36.8 C) (Oral)   Resp 13   Wt 41.7 kg   LMP 03/06/2013 (LMP Unknown)   SpO2 99%   BMI 22.16 kg/m   Physical Exam Vitals and nursing note reviewed.  Constitutional:      General: She is not in acute distress.    Appearance: She is well-developed. She is ill-appearing.  HENT:     Head: Normocephalic and atraumatic.  Eyes:     Extraocular Movements: Extraocular movements intact.     Conjunctiva/sclera: Conjunctivae normal.     Pupils: Pupils are equal, round, and reactive to light.  Cardiovascular:     Rate and Rhythm: Normal rate and regular rhythm.     Heart sounds: No murmur heard. Pulmonary:     Effort: Pulmonary effort is normal. No respiratory distress.     Breath sounds: Rales present.  Abdominal:     General: Abdomen is protuberant.     Palpations: Abdomen is soft. There is fluid wave.     Tenderness: There is no abdominal tenderness.  Musculoskeletal:     Cervical back: Neck supple.  Skin:    General: Skin is warm and dry.  Neurological:     Mental Status: She is disoriented and confused.     GCS: GCS eye subscore is 4. GCS verbal subscore is 3. GCS motor subscore is 6.     Cranial Nerves: Cranial nerves are intact.     Sensory: Sensation is intact.  Motor: Motor function is  intact.     Coordination: Coordination is intact.    ED Results / Procedures / Treatments   Labs (all labs ordered are listed, but only abnormal results are displayed) Labs Reviewed  COMPREHENSIVE METABOLIC PANEL - Abnormal; Notable for the following components:      Result Value   Sodium 134 (*)    Potassium 3.4 (*)    Chloride 89 (*)    Glucose, Bld 124 (*)    BUN 47 (*)    Creatinine, Ser 4.86 (*)    Albumin 2.8 (*)    GFR, Estimated 10 (*)    Anion gap 16 (*)    All other components within normal limits  CBC WITH DIFFERENTIAL/PLATELET - Abnormal; Notable for the following components:   Hemoglobin 11.2 (*)    RDW 20.8 (*)    All other components within normal limits  PHOSPHORUS - Abnormal; Notable for the following components:   Phosphorus 5.7 (*)    All other components within normal limits  BRAIN NATRIURETIC PEPTIDE - Abnormal; Notable for the following components:   B Natriuretic Peptide >4,500.0 (*)    All other components within normal limits  I-STAT VENOUS BLOOD GAS, ED - Abnormal; Notable for the following components:   pO2, Ven 115.0 (*)    Bicarbonate 30.9 (*)    Acid-Base Excess 5.0 (*)    Sodium 133 (*)    Potassium 3.4 (*)    Calcium, Ion 0.98 (*)    All other components within normal limits  RESP PANEL BY RT-PCR (FLU A&B, COVID) [id number]  URINE CULTURE  RESPIRATORY PANEL BY PCR  AMMONIA  LACTIC ACID, PLASMA  MAGNESIUM  URINALYSIS, COMPLETE (UACMP) WITH MICROSCOPIC  BLOOD GAS, VENOUS  I-STAT BETA HCG BLOOD, ED ([facility], [facility], [facility] ONLY)  CBG MONITORING, ED    EKG EKG Interpretation  Date/Time:  Monday February 18 2021 19:36:19 EDT Ventricular Rate:  68 PR Interval:  150 QRS Duration: 145 QT Interval:  501 QTC Calculation: 533 R Axis:   192 Text Interpretation: Sinus rhythm Nonspecific intraventricular conduction delay Borderline abnrm T, anterolateral leads Confirmed by Elnora Morrison [phone]) on 02/18/2021 7:47:28 PM Also confirmed by Elnora Morrison  [phone]), editor Hattie Perch ([phone])  on 02/19/2021 8:04:54 AM  Radiology CT HEAD WO CONTRAST  Result Date: 02/18/2021 CLINICAL DATA:  Encephalopathy EXAM: CT HEAD WITHOUT CONTRAST TECHNIQUE: Contiguous axial images were obtained from the base of the skull through the vertex without intravenous contrast. COMPARISON:  None. FINDINGS: Brain: There is no mass, hemorrhage or extra-axial collection. There is generalized atrophy without lobar predilection. Hypodensity of the white matter is most commonly associated with chronic microvascular disease. There are old small vessel infarcts of the basal ganglia and thalamus. Vascular: No abnormal hyperdensity of the major intracranial arteries or dural venous sinuses. No intracranial atherosclerosis. Skull: The visualized skull base, calvarium and extracranial soft tissues are normal. Sinuses/Orbits: No fluid levels or advanced mucosal thickening of the visualized paranasal sinuses. No mastoid or middle ear effusion. The orbits are normal. IMPRESSION: 1. No acute intracranial abnormality. 2. Atrophy and chronic microvascular ischemia. 3. Old small vessel infarcts of the basal ganglia and thalamus. Electronically Signed   By: Ulyses Jarred M.D.   On: 02/18/2021 20:57   CT Angio Chest PE W and/or Wo Contrast  Result Date: 02/18/2021 CLINICAL DATA:  Concern for pulmonary embolism.  Hypoxia. EXAM: CT ANGIOGRAPHY CHEST WITH CONTRAST TECHNIQUE: Multidetector CT imaging of the chest was performed using the standard  protocol during bolus administration of intravenous contrast. Multiplanar CT image reconstructions and MIPs were obtained to evaluate the vascular anatomy. CONTRAST:  60m OMNIPAQUE IOHEXOL 350 MG/ML SOLN COMPARISON:  CT dated 01/25/2021. FINDINGS: Cardiovascular: Moderate cardiomegaly. No pericardial effusion. There is retrograde flow of contrast from the right atrium into the IVC consistent with right heart dysfunction. Moderate atherosclerotic  calcification of the thoracic aorta. No aneurysmal dilatation. Non opacification of a small left upper lobe segmental branch (series 7 images 91-101) likely artifactual. A nonocclusive pulmonary artery embolus within this branch is less likely. No other pulmonary artery embolus identified. Mediastinum/Nodes: No hilar or mediastinal adenopathy. The esophagus is grossly unremarkable. Mediastinal fluid collection. Lungs/Pleura: Small bilateral pleural effusions, right greater than left. There is associated partial compressive atelectasis of the lower lobes. Pneumonia is not excluded clinical correlation is recommended. No pneumothorax. The central airways are patent. Upper Abdomen: Small ascites. Musculoskeletal: Diffuse subcutaneous edema and anasarca. Osteopenia. No acute osseous pathology. Old sternal fracture. Review of the MIP images confirms the above findings. IMPRESSION: 1. No definite CT evidence of pulmonary embolism. 2. Moderate cardiomegaly with evidence of right heart dysfunction. 3. Small bilateral pleural effusions, right greater than left, with associated partial compressive atelectasis of the lower lobes. Pneumonia is not excluded clinical correlation is recommended. 4. Small ascites and anasarca. 5. Aortic Atherosclerosis (ICD10-I70.0). Electronically Signed   By: AAnner CreteM.D.   On: 02/18/2021 23:14   DG Chest Port 1 View  Result Date: 02/18/2021 CLINICAL DATA:  Lethargy. EXAM: PORTABLE CHEST 1 VIEW COMPARISON:  Chest radiograph dated 02/16/2021. FINDINGS: Faint right lung base density, likely atelectasis. No focal consolidation pleural effusion or pneumothorax. Cardiomegaly. No acute pathology. IMPRESSION: 1. No acute cardiopulmonary process. 2. Cardiomegaly. Electronically Signed   By: AAnner CreteM.D.   On: 02/18/2021 20:26    Procedures Ultrasound ED Echo  Date/Time: 02/19/2021 5:50 PM Performed by: HViolet Baldy MD Authorized by: ZElnora Morrison MD   Procedure details:     Indications: dyspnea     Views: subxiphoid and apical 4 chamber view     Images: archived   Findings:    Pericardium: no pericardial effusion     LV Function: depressed (30 - 50%)     RV Diameter: normal   Impression:    Impression: decreased contractility     Medications Ordered in ED Medications  multivitamin (RENA-VIT) tablet 1 tablet (has no administration in time range)  heparin injection 5,000 Units (5,000 Units Subcutaneous Given 02/19/21 1608)  sodium chloride flush (NS) 0.9 % injection 3 mL (0 mLs Intravenous Hold 02/19/21 1000)  acetaminophen (TYLENOL) tablet 650 mg (has no administration in time range)    Or  acetaminophen (TYLENOL) suppository 650 mg (has no administration in time range)  polyethylene glycol (MIRALAX / GLYCOLAX) packet 17 g (has no administration in time range)  aspirin chewable tablet 81 mg (81 mg Oral Given 02/19/21 1608)  atorvastatin (LIPITOR) tablet 40 mg (has no administration in time range)  carvedilol (COREG) tablet 3.125 mg (3.125 mg Oral Given 02/19/21 1608)  FLUoxetine (PROZAC) capsule 20 mg (20 mg Oral Given 02/19/21 1608)  levothyroxine (SYNTHROID) tablet 75 mcg (75 mcg Oral Given 02/19/21 0623)  midodrine (PROAMATINE) tablet 10 mg (10 mg Oral Not Given 02/19/21 1440)  pantoprazole (PROTONIX) EC tablet 40 mg ( Oral Canceled Entry 02/19/21 1000)  sodium bicarbonate tablet 1,300 mg (1,300 mg Oral Given 02/19/21 1608)  albuterol (PROVENTIL) (2.5 MG/3ML) 0.083% nebulizer solution 2.5 mg (has no administration in time  range)  Chlorhexidine Gluconate Cloth 2 % PADS 6 each (0 each Topical Hold 02/19/21 0744)  amoxicillin-clavulanate (AUGMENTIN) 500-125 MG per tablet 500 mg (has no administration in time range)  insulin aspart (novoLOG) injection 0-6 Units (0 Units Subcutaneous Not Given 02/19/21 1601)  iohexol (OMNIPAQUE) 350 MG/ML injection 75 mL (75 mLs Intravenous Contrast Given 02/18/21 2259)  ceFEPIme (MAXIPIME) 2 g in sodium chloride 0.9 % 100 mL IVPB  (0 g Intravenous Stopped 02/19/21 0022)  furosemide (LASIX) injection 40 mg (40 mg Intravenous Given 02/19/21 0130)  potassium chloride SA (KLOR-CON) CR tablet 20 mEq (20 mEq Oral Given 02/19/21 0129)  dextrose 50 % solution 25 mL (25 mLs Intravenous Given 02/19/21 1508)    ED Course  I have reviewed the triage vital signs and the nursing notes.  Pertinent labs & imaging results that were available during my care of the patient were reviewed by me and considered in my medical decision making (see chart for details).  Clinical Course as of 02/19/21 1756  Mon Feb 18, 2021  2335 AMS in the context of acute on chronic hypoxic respiratory failure (baseline 3L) now on 10L [place] 2/2 volume overload vs HCAP.  Labs notable for stable noncritical anemia.  CMP with mild hyponatremia to 134 and hypokalemia to 3.4 as well as mild anion gap of 16.  COVID and influenza testing negative.  VBG with physiologic pH and PCO2 50.  Urinalysis pending.  CT head unremarkable.  CTA chest with infiltrate and bilateral pleural effusions, as well as ascites.  Given cefepime for coverage of HCAP. [CH]    Clinical Course User Index [CH] Violet Baldy, MD   MDM Rules/Calculators/A&P                          60 y.o. female with significant past medical history as above who presents for evaluation of altered mental status as well as increasing oxygen requirements.  Hemodynamically stable but hypoxic on baseline 3 L nasal cannula.  Patient was increased to 5 L nasal cannula.  Patient later had a sustained desaturated to 82% and was uptitrated to 10 L.  Labs notable for stable noncritical anemia.  CMP with mild hyponatremia to 134 and hypokalemia to 3.4 as well as mild anion gap of 16.  COVID and influenza testing negative.  VBG with physiologic pH and PCO2 50.  BNP is profoundly elevated.  Urinalysis pending.  CT head unremarkable.  CTA chest with infiltrate and bilateral pleural effusions, as well as ascites.  Presentation is  consistent with acute on chronic hypoxic respiratory failure (baseline 3L, currently on 10L) 2/2 volume overload vs HCAP.   Altered mental status is likely due to toxic metabolic encephalopathy.  Given cefepime for coverage of HCAP.  Patient was admitted to hospitalist for further care.   Final Clinical Impression(s) / ED Diagnoses Final diagnoses:  AMS (altered mental status)    Rx / DC Orders ED Discharge Orders     None          Violet Baldy, MD 02/19/21 1757    Elnora Morrison, MD 02/22/21 [phone]

## 2021-02-19 ENCOUNTER — Encounter (HOSPITAL_COMMUNITY): Payer: Self-pay | Admitting: Internal Medicine

## 2021-02-19 DIAGNOSIS — E039 Hypothyroidism, unspecified: Secondary | ICD-10-CM | POA: Diagnosis present

## 2021-02-19 DIAGNOSIS — D696 Thrombocytopenia, unspecified: Secondary | ICD-10-CM | POA: Diagnosis present

## 2021-02-19 DIAGNOSIS — N2581 Secondary hyperparathyroidism of renal origin: Secondary | ICD-10-CM | POA: Diagnosis present

## 2021-02-19 DIAGNOSIS — E559 Vitamin D deficiency, unspecified: Secondary | ICD-10-CM | POA: Diagnosis present

## 2021-02-19 DIAGNOSIS — Z20822 Contact with and (suspected) exposure to covid-19: Secondary | ICD-10-CM | POA: Diagnosis present

## 2021-02-19 DIAGNOSIS — Z66 Do not resuscitate: Secondary | ICD-10-CM | POA: Diagnosis present

## 2021-02-19 DIAGNOSIS — E43 Unspecified severe protein-calorie malnutrition: Secondary | ICD-10-CM | POA: Diagnosis present

## 2021-02-19 DIAGNOSIS — N186 End stage renal disease: Secondary | ICD-10-CM | POA: Diagnosis present

## 2021-02-19 DIAGNOSIS — B952 Enterococcus as the cause of diseases classified elsewhere: Secondary | ICD-10-CM | POA: Diagnosis present

## 2021-02-19 DIAGNOSIS — G929 Unspecified toxic encephalopathy: Secondary | ICD-10-CM | POA: Diagnosis present

## 2021-02-19 DIAGNOSIS — B9689 Other specified bacterial agents as the cause of diseases classified elsewhere: Secondary | ICD-10-CM | POA: Diagnosis present

## 2021-02-19 DIAGNOSIS — K219 Gastro-esophageal reflux disease without esophagitis: Secondary | ICD-10-CM | POA: Diagnosis present

## 2021-02-19 DIAGNOSIS — E1122 Type 2 diabetes mellitus with diabetic chronic kidney disease: Secondary | ICD-10-CM | POA: Diagnosis present

## 2021-02-19 DIAGNOSIS — L03317 Cellulitis of buttock: Secondary | ICD-10-CM | POA: Diagnosis present

## 2021-02-19 DIAGNOSIS — J9621 Acute and chronic respiratory failure with hypoxia: Secondary | ICD-10-CM | POA: Diagnosis present

## 2021-02-19 DIAGNOSIS — F17210 Nicotine dependence, cigarettes, uncomplicated: Secondary | ICD-10-CM | POA: Diagnosis present

## 2021-02-19 DIAGNOSIS — I132 Hypertensive heart and chronic kidney disease with heart failure and with stage 5 chronic kidney disease, or end stage renal disease: Secondary | ICD-10-CM | POA: Diagnosis present

## 2021-02-19 DIAGNOSIS — E785 Hyperlipidemia, unspecified: Secondary | ICD-10-CM | POA: Diagnosis present

## 2021-02-19 DIAGNOSIS — I5033 Acute on chronic diastolic (congestive) heart failure: Secondary | ICD-10-CM | POA: Diagnosis present

## 2021-02-19 DIAGNOSIS — R188 Other ascites: Secondary | ICD-10-CM | POA: Diagnosis present

## 2021-02-19 DIAGNOSIS — F32A Depression, unspecified: Secondary | ICD-10-CM | POA: Diagnosis present

## 2021-02-19 DIAGNOSIS — N390 Urinary tract infection, site not specified: Secondary | ICD-10-CM | POA: Diagnosis present

## 2021-02-19 DIAGNOSIS — M810 Age-related osteoporosis without current pathological fracture: Secondary | ICD-10-CM | POA: Diagnosis present

## 2021-02-19 DIAGNOSIS — R21 Rash and other nonspecific skin eruption: Secondary | ICD-10-CM | POA: Diagnosis present

## 2021-02-19 LAB — CBC
HCT: 33.8 % — ABNORMAL LOW (ref 36.0–46.0)
Hemoglobin: 10.1 g/dL — ABNORMAL LOW (ref 12.0–15.0)
MCH: 27.1 pg (ref 26.0–34.0)
MCHC: 29.9 g/dL — ABNORMAL LOW (ref 30.0–36.0)
MCV: 90.6 fL (ref 80.0–100.0)
Platelets: 206 10*3/uL (ref 150–400)
RBC: 3.73 MIL/uL — ABNORMAL LOW (ref 3.87–5.11)
RDW: 21 % — ABNORMAL HIGH (ref 11.5–15.5)
WBC: 6.5 10*3/uL (ref 4.0–10.5)
nRBC: 0 % (ref 0.0–0.2)

## 2021-02-19 LAB — COMPREHENSIVE METABOLIC PANEL
ALT: 13 U/L (ref 0–44)
AST: 25 U/L (ref 15–41)
Albumin: 2.6 g/dL — ABNORMAL LOW (ref 3.5–5.0)
Alkaline Phosphatase: 111 U/L (ref 38–126)
Anion gap: 13 (ref 5–15)
BUN: 47 mg/dL — ABNORMAL HIGH (ref 6–20)
CO2: 28 mmol/L (ref 22–32)
Calcium: 8.9 mg/dL (ref 8.9–10.3)
Chloride: 92 mmol/L — ABNORMAL LOW (ref 98–111)
Creatinine, Ser: 4.79 mg/dL — ABNORMAL HIGH (ref 0.44–1.00)
GFR, Estimated: 10 mL/min — ABNORMAL LOW (ref >60)
Glucose, Bld: 86 mg/dL (ref 70–99)
Potassium: 3.7 mmol/L (ref 3.5–5.1)
Sodium: 133 mmol/L — ABNORMAL LOW (ref 135–145)
Total Bilirubin: 1.1 mg/dL (ref 0.3–1.2)
Total Protein: 6.7 g/dL (ref 6.5–8.1)

## 2021-02-19 LAB — URINE CULTURE: Culture: 40000 — AB

## 2021-02-19 LAB — GLUCOSE, CAPILLARY
Glucose-Capillary: 54 mg/dL — ABNORMAL LOW (ref 70–99)
Glucose-Capillary: 118 mg/dL — ABNORMAL HIGH (ref 70–99)
Glucose-Capillary: 103 mg/dL — ABNORMAL HIGH (ref 70–99)

## 2021-02-19 LAB — MAGNESIUM: Magnesium: 2.3 mg/dL (ref 1.7–2.4)

## 2021-02-19 LAB — BRAIN NATRIURETIC PEPTIDE: B Natriuretic Peptide: >4500 pg/mL — ABNORMAL HIGH (ref 0.0–100.0)

## 2021-02-19 LAB — LACTIC ACID, PLASMA: Lactic Acid, Venous: 1.7 mmol/L (ref 0.5–1.9)

## 2021-02-19 LAB — PHOSPHORUS: Phosphorus: 5.7 mg/dL — ABNORMAL HIGH (ref 2.5–4.6)

## 2021-02-19 LAB — CBG MONITORING, ED: Glucose-Capillary: 94 mg/dL (ref 70–99)

## 2021-02-19 MED ORDER — INSULIN ASPART 100 UNIT/ML IJ SOLN: 0.0000 [IU] | Freq: Three times a day (TID) | INTRAMUSCULAR | Status: DC

## 2021-02-19 MED ORDER — SODIUM BICARBONATE 650 MG PO TABS
1300.0000 mg | ORAL_TABLET | Freq: Two times a day (BID) | ORAL | Status: DC
  Administered 2021-02-19 – 2021-02-20 (×3): 1300 mg via ORAL
  Filled 2021-02-19 (×3): qty 2

## 2021-02-19 MED ORDER — LEVOTHYROXINE SODIUM 75 MCG PO TABS
75.0000 ug | ORAL_TABLET | Freq: Every day | ORAL | Status: DC
  Administered 2021-02-19 – 2021-02-22 (×4): 75 ug via ORAL
  Filled 2021-02-19 (×4): qty 1

## 2021-02-19 MED ORDER — FLUOXETINE HCL 20 MG PO CAPS
20.0000 mg | ORAL_CAPSULE | Freq: Every day | ORAL | Status: DC
  Administered 2021-02-19 – 2021-02-22 (×4): 20 mg via ORAL
  Filled 2021-02-19 (×5): qty 1

## 2021-02-19 MED ORDER — FUROSEMIDE 10 MG/ML IJ SOLN
40.0000 mg | Freq: Once | INTRAMUSCULAR | Status: AC
  Administered 2021-02-19: 40 mg via INTRAVENOUS
  Filled 2021-02-19: qty 4

## 2021-02-19 MED ORDER — CHLORHEXIDINE GLUCONATE CLOTH 2 % EX PADS
6.0000 | MEDICATED_PAD | Freq: Every day | CUTANEOUS | Status: DC
  Administered 2021-02-20 – 2021-02-22 (×2): 6 via TOPICAL

## 2021-02-19 MED ORDER — ALBUTEROL SULFATE (2.5 MG/3ML) 0.083% IN NEBU: 2.5000 mg | INHALATION_SOLUTION | RESPIRATORY_TRACT | Status: DC | PRN

## 2021-02-19 MED ORDER — AMOXICILLIN-POT CLAVULANATE 500-125 MG PO TABS
1.0000 | ORAL_TABLET | Freq: Every day | ORAL | Status: DC
  Administered 2021-02-19 – 2021-02-22 (×4): 500 mg via ORAL
  Filled 2021-02-19 (×5): qty 1

## 2021-02-19 MED ORDER — AMOXICILLIN-POT CLAVULANATE 500-125 MG PO TABS
1.0000 | ORAL_TABLET | Freq: Every day | ORAL | Status: DC
  Filled 2021-02-19: qty 1

## 2021-02-19 MED ORDER — CARVEDILOL 3.125 MG PO TABS
3.1250 mg | ORAL_TABLET | Freq: Two times a day (BID) | ORAL | Status: DC
  Administered 2021-02-19 – 2021-02-20 (×3): 3.125 mg via ORAL
  Filled 2021-02-19 (×3): qty 1

## 2021-02-19 MED ORDER — POTASSIUM CHLORIDE CRYS ER 20 MEQ PO TBCR
20.0000 meq | EXTENDED_RELEASE_TABLET | Freq: Once | ORAL | Status: AC
  Administered 2021-02-19: 20 meq via ORAL
  Filled 2021-02-19: qty 1

## 2021-02-19 MED ORDER — AMOXICILLIN-POT CLAVULANATE 875-125 MG PO TABS
1.0000 | ORAL_TABLET | Freq: Every day | ORAL | Status: DC
  Administered 2021-02-19: 1 via ORAL
  Filled 2021-02-19: qty 1

## 2021-02-19 MED ORDER — ASPIRIN 81 MG PO CHEW
81.0000 mg | CHEWABLE_TABLET | Freq: Every day | ORAL | Status: DC
  Administered 2021-02-19 – 2021-02-22 (×4): 81 mg via ORAL
  Filled 2021-02-19 (×5): qty 1

## 2021-02-19 MED ORDER — PANTOPRAZOLE SODIUM 40 MG PO TBEC
40.0000 mg | DELAYED_RELEASE_TABLET | Freq: Every day | ORAL | Status: DC
  Administered 2021-02-19 – 2021-02-22 (×4): 40 mg via ORAL
  Filled 2021-02-19 (×4): qty 1

## 2021-02-19 MED ORDER — DEXTROSE 50 % IV SOLN
25.0000 mL | Freq: Once | INTRAVENOUS | Status: AC
  Administered 2021-02-19: 25 mL via INTRAVENOUS
  Filled 2021-02-19: qty 50

## 2021-02-19 MED ORDER — MIDODRINE HCL 5 MG PO TABS: 10.0000 mg | ORAL_TABLET | ORAL | Status: DC

## 2021-02-19 MED ORDER — ATORVASTATIN CALCIUM 40 MG PO TABS
40.0000 mg | ORAL_TABLET | Freq: Every day | ORAL | Status: DC
  Administered 2021-02-20 – 2021-02-22 (×3): 40 mg via ORAL
  Filled 2021-02-19 (×3): qty 1

## 2021-02-19 NOTE — Plan of Care (Signed)

## 2021-02-19 NOTE — ED Notes (Signed)
Pt transported to dialysis

## 2021-02-19 NOTE — Progress Notes (Signed)
PROGRESS NOTE                                                                                                                                                                                                             Patient Demographics:    [name] Michael, is a 60 y.o. female, DOB - 06-27-1960, DW:[id number]  Outpatient Primary MD for the patient is Charlott Rakes, MD    LOS - 0  Admit date - 02/18/2021    Chief Complaint  Patient presents with   Fatigue       Brief Narrative (HPI from H&P)    [name] Michael is a 60 y.o. female with medical history significant of ESRD on HD TTS, CVA, hyperlipidemia, hypertension, diabetes, CHF, anemia, hypothyroidism, depression, GERD, prolonged QT, syncope, cytopenia who presents from her facility with hypoxia and altered mental status after she could not undergo her full HD session 2 days ago outpt due to > discomfort.    Subjective:   Patient in bed, appears comfortable, denies any headache, no fever, no chest pain or pressure, mild shortness of breath , no abdominal pain. No new focal weakness.   Assessment  & Plan :     Acute toxic encephalopathy and acute on chronic hypoxic respiratory failure due to fluid overload from missed HD session - she only did 30 minutes of outpatient dialysis on last Saturday that is 2 days prior to hospital admission and had signs of uremia along with fluid overload causing her symptoms, nephrology has been consulted she is undergoing HD and already feeling better.  Continue supportive care and monitor.  Note she uses 2 to 3 L oxygen at baseline.  2.  ESRD with chronic mild to moderate abdominal ascites - nephrology on board on TTS schedule.     3.  Acute on chronic diastolic CHF last EF [id number] on recent echocardiogram.  Fluid removal through HD and ascites fluid removal through paracentesis  4.  Anemia of chronic disease.  Stable.  5.  Chronic  thrombocytopenia.  Stable monitor.  6.  Hypertension.  Pressure stable on Coreg and slightly on the higher side, hold further midodrine.  7.  History of CVA.  On aspirin and statin for secondary prevention.  8.  Dyslipidemia.  Continue statin.  .  9.  Hypothyroidism with stable TSH currently on Synthroid.  10. Severe protein calorie malnutrition with massive third spacing of fluid.  Supportive care protein supplements.    11. Recent outpatient diagnosis of UTI -  on Augmentin finish the course.     12. GERD - PPI.  13. DM type II.  Sliding scale.  Lab Results  Component Value Date   HGBA1C 5.9 (H) 10/14/2020   CBG (last 3)  Recent Labs    02/18/21 2017 02/19/21 0744  GLUCAP 122* 94   Lab Results  Component Value Date   TSH 5.240 (H) 01/26/2021         Condition - Extremely Guarded  Family Communication  : None present   Code Status :  DNR  Consults  :  Renal    PUD Prophylaxis : PPI   Procedures  :     CT head.  Nonacute.    CT A Chest  - No PE      Disposition Plan  :    Status is: Inpatient  Remains inpatient appropriate because:IV treatments appropriate due to intensity of illness or inability to take PO  Dispo: The patient is from: SNF              Anticipated d/c is to: SNF              Patient currently is not medically stable to d/c.   Difficult to place patient No   DVT Prophylaxis  :  Heparin added  heparin injection 5,000 Units Start: 02/19/21 1400   Lab Results  Component Value Date   PLT 206 02/19/2021    Diet :  Diet Order             Diet NPO time specified Except for: Sips with Meds  Diet effective now                    Inpatient Medications  Scheduled Meds:  amoxicillin-clavulanate  1 tablet Oral QHS   aspirin  81 mg Oral Daily   [START ON 02/20/2021] atorvastatin  40 mg Oral QHS   carvedilol  3.125 mg Oral BID WC   Chlorhexidine Gluconate Cloth  6 each Topical Q0600   FLUoxetine  20 mg Oral Daily    heparin  5,000 Units Subcutaneous Q8H   insulin aspart  0-6 Units Subcutaneous TID WC   levothyroxine  75 mcg Oral Q0600   midodrine  10 mg Oral Q T,Th,Sa-HD   multivitamin  1 tablet Oral QHS   pantoprazole  40 mg Oral Daily   sodium bicarbonate  1,300 mg Oral BID   sodium chloride flush  3 mL Intravenous Q12H   Continuous Infusions:   PRN Meds:.acetaminophen **OR** acetaminophen, albuterol, polyethylene glycol  Antibiotics  :    Anti-infectives (From admission, onward)    Start     Dose/Rate Route Frequency Ordered Stop   02/19/21 0130  amoxicillin-clavulanate (AUGMENTIN) 875-125 MG per tablet 1 tablet        1 tablet Oral Daily at bedtime 02/19/21 0035     02/18/21 2330  ceFEPIme (MAXIPIME) 2 g in sodium chloride 0.9 % 100 mL IVPB        2 g 200 mL/hr over 30 Minutes Intravenous  Once 02/18/21 2329 02/19/21 0022        Time Spent in minutes  30   Lala Lund M.D on 02/19/2021 at 1:26 PM  To page go to [email or web address]   [facility] -  Office  [phone]   See all Orders  from today for further details    Objective:   Vitals:   02/19/21 1130 02/19/21 1200 02/19/21 1230 02/19/21 1249  BP: 129/68 122/67 118/62   Pulse: 67 67 66   Resp:   13   Temp:      TempSrc:      SpO2:      Weight:    41.7 kg    Wt Readings from Last 3 Encounters:  02/19/21 41.7 kg  02/01/21 39 kg  01/23/21 46.4 kg     Intake/Output Summary (Last 24 hours) at 02/19/2021 1326 Last data filed at 02/19/2021 1249 Gross per 24 hour  Intake --  Output 4500 ml  Net -4500 ml     Physical Exam  Awake Alert, No new F.N deficits, appears weak and tiered [place].AT,PERRAL Supple Neck,No JVD, No cervical lymphadenopathy appriciated.  Symmetrical Chest wall movement, Good air movement bilaterally, few rales RRR,No Gallops, Rubs or new Murmurs, No Parasternal Heave +ve B.Sounds, Abd Soft, No tenderness, No organomegaly appriciated, No rebound - guarding or rigidity. No Cyanosis,  Clubbing or edema, No new Rash or bruise     Data Review:    CBC Recent Labs  Lab 02/16/21 1439 02/18/21 2017 02/18/21 2035 02/19/21 0330  WBC 7.1 6.3  --  6.5  HGB 10.7* 11.2* 13.3 10.1*  HCT 34.6* 37.0 39.0 33.8*  PLT 251 214  --  206  MCV 88.5 91.4  --  90.6  MCH 27.4 27.7  --  27.1  MCHC 30.9 30.3  --  29.9*  RDW 21.1* 20.8*  --  21.0*  LYMPHSABS 0.7 0.8  --   --   MONOABS 0.3 0.6  --   --   EOSABS 0.1 0.2  --   --   BASOSABS 0.1 0.1  --   --     Recent Labs  Lab 02/16/21 1439 02/16/21 1833 02/16/21 2023 02/18/21 2017 02/18/21 2035 02/19/21 0028 02/19/21 0159  NA 133*  --   --  134* 133*  --  133*  K 3.1*  --   --  3.4* 3.4*  --  3.7  CL 91*  --   --  89*  --   --  92*  CO2 30  --   --  29  --   --  28  GLUCOSE 128*  --   --  124*  --   --  86  BUN 33*  --   --  47*  --   --  47*  CREATININE 3.31*  --   --  4.86*  --   --  4.79*  CALCIUM 8.8*  --   --  9.0  --   --  8.9  AST 26  --   --  26  --   --  25  ALT 14  --   --  12  --   --  13  ALKPHOS 101  --   --  109  --   --  111  BILITOT 1.0  --   --  0.9  --   --  1.1  ALBUMIN 2.7*  --   --  2.8*  --   --  2.6*  LATICACIDVEN  --  2.4* 1.9  --   --  1.7  --   AMMONIA  --   --   --  31  --   --   --   BNP  --   --   --   --   --  >  4,500.0*  --     ------------------------------------------------------------------------------------------------------------------ No results for input(s): CHOL, HDL, LDLCALC, TRIG, CHOLHDL, LDLDIRECT in the last 72 hours.  Lab Results  Component Value Date   HGBA1C 5.9 (H) 10/14/2020   ------------------------------------------------------------------------------------------------------------------ No results for input(s): TSH, T4TOTAL, T3FREE, THYROIDAB in the last 72 hours.  Invalid input(s): FREET3   Cardiac Enzymes No results for input(s): CKMB, TROPONINI, MYOGLOBIN in the last 168 hours.  Invalid input(s):  CK ------------------------------------------------------------------------------------------------------------------    Component Value Date/Time   BNP >4,500.0 (H) 02/19/2021 0028    Radiology Reports    CT HEAD WO CONTRAST  Result Date: 02/18/2021 CLINICAL DATA:  Encephalopathy EXAM: CT HEAD WITHOUT CONTRAST TECHNIQUE: Contiguous axial images were obtained from the base of the skull through the vertex without intravenous contrast. COMPARISON:  None. FINDINGS: Brain: There is no mass, hemorrhage or extra-axial collection. There is generalized atrophy without lobar predilection. Hypodensity of the white matter is most commonly associated with chronic microvascular disease. There are old small vessel infarcts of the basal ganglia and thalamus. Vascular: No abnormal hyperdensity of the major intracranial arteries or dural venous sinuses. No intracranial atherosclerosis. Skull: The visualized skull base, calvarium and extracranial soft tissues are normal. Sinuses/Orbits: No fluid levels or advanced mucosal thickening of the visualized paranasal sinuses. No mastoid or middle ear effusion. The orbits are normal. IMPRESSION: 1. No acute intracranial abnormality. 2. Atrophy and chronic microvascular ischemia. 3. Old small vessel infarcts of the basal ganglia and thalamus. Electronically Signed   By: Ulyses Jarred M.D.   On: 02/18/2021 20:57      CT Angio Chest PE W and/or Wo Contrast  Result Date: 02/18/2021 CLINICAL DATA:  Concern for pulmonary embolism.  Hypoxia. EXAM: CT ANGIOGRAPHY CHEST WITH CONTRAST TECHNIQUE: Multidetector CT imaging of the chest was performed using the standard protocol during bolus administration of intravenous contrast. Multiplanar CT image reconstructions and MIPs were obtained to evaluate the vascular anatomy. CONTRAST:  41m OMNIPAQUE IOHEXOL 350 MG/ML SOLN COMPARISON:  CT dated 01/25/2021. FINDINGS: Cardiovascular: Moderate cardiomegaly. No pericardial effusion. There is  retrograde flow of contrast from the right atrium into the IVC consistent with right heart dysfunction. Moderate atherosclerotic calcification of the thoracic aorta. No aneurysmal dilatation. Non opacification of a small left upper lobe segmental branch (series 7 images 91-101) likely artifactual. A nonocclusive pulmonary artery embolus within this branch is less likely. No other pulmonary artery embolus identified. Mediastinum/Nodes: No hilar or mediastinal adenopathy. The esophagus is grossly unremarkable. Mediastinal fluid collection. Lungs/Pleura: Small bilateral pleural effusions, right greater than left. There is associated partial compressive atelectasis of the lower lobes. Pneumonia is not excluded clinical correlation is recommended. No pneumothorax. The central airways are patent. Upper Abdomen: Small ascites. Musculoskeletal: Diffuse subcutaneous edema and anasarca. Osteopenia. No acute osseous pathology. Old sternal fracture. Review of the MIP images confirms the above findings. IMPRESSION: 1. No definite CT evidence of pulmonary embolism. 2. Moderate cardiomegaly with evidence of right heart dysfunction. 3. Small bilateral pleural effusions, right greater than left, with associated partial compressive atelectasis of the lower lobes. Pneumonia is not excluded clinical correlation is recommended. 4. Small ascites and anasarca. 5. Aortic Atherosclerosis (ICD10-I70.0). Electronically Signed   By: AAnner CreteM.D.   On: 02/18/2021 23:14     DG Chest Port 1 View  Result Date: 02/18/2021 CLINICAL DATA:  Lethargy. EXAM: PORTABLE CHEST 1 VIEW COMPARISON:  Chest radiograph dated 02/16/2021. FINDINGS: Faint right lung base density, likely atelectasis. No focal consolidation pleural effusion or pneumothorax. Cardiomegaly. No  acute pathology. IMPRESSION: 1. No acute cardiopulmonary process. 2. Cardiomegaly. Electronically Signed   By: Anner Crete M.D.   On: 02/18/2021 20:26

## 2021-02-19 NOTE — Progress Notes (Signed)
Patient admitted under observation status.  Appears to have her last dialysis treatment 02/16/2021.  She appears to be about 5 kg above her dry weight.  Usual dialysis days are Tuesday Thursday Saturday.  We are asked to provide dialysis treatment on an urgent basis.  She is COVID-negative with increasing oxygen requirements.  PE study is negative and chest x-ray consistent with congestive heart failure and cardiomegaly.  Blood pressure 184/93 pulse 72 oxygen saturations 97% 10 L oxygen  Sodium 133 potassium 3.7 chloride 92 CO2 28 BUN 47 creatinine 4.79 glucose 86 albumin 2.6.  Hemoglobin 10.1  Dialysis  [facility] kidney center Tuesday Thursday Saturday dialysis.  Last dialysis treatment was 02/16/2021.  She has a left AV graft.  We will plan urgent dialysis treatment

## 2021-02-19 NOTE — Progress Notes (Signed)
Heart Failure Navigator Progress Note  Assessed for Heart & Vascular TOC clinic readiness.  Patient does not meet criteria due to currently on HD and palliative care intervention for GOC.   EF 10/05/20 50-55%, with severe TR  Navigator available for reassessment of patient.   Pricilla Holm, MSN, RN Heart Failure Nurse Navigator [phone]

## 2021-02-20 ENCOUNTER — Telehealth: Payer: Self-pay | Admitting: *Deleted

## 2021-02-20 ENCOUNTER — Inpatient Hospital Stay (HOSPITAL_COMMUNITY): Payer: Medicare Other

## 2021-02-20 HISTORY — PX: IR PARACENTESIS: IMG2679

## 2021-02-20 LAB — GLUCOSE, CAPILLARY
Glucose-Capillary: 77 mg/dL (ref 70–99)
Glucose-Capillary: 100 mg/dL — ABNORMAL HIGH (ref 70–99)
Glucose-Capillary: 145 mg/dL — ABNORMAL HIGH (ref 70–99)
Glucose-Capillary: 153 mg/dL — ABNORMAL HIGH (ref 70–99)
Glucose-Capillary: 154 mg/dL — ABNORMAL HIGH (ref 70–99)

## 2021-02-20 MED ORDER — DARBEPOETIN ALFA 25 MCG/0.42ML IJ SOSY: 25.0000 ug | PREFILLED_SYRINGE | INTRAMUSCULAR | Status: DC

## 2021-02-20 MED ORDER — HYDRALAZINE HCL 20 MG/ML IJ SOLN
10.0000 mg | Freq: Four times a day (QID) | INTRAMUSCULAR | Status: DC | PRN
  Administered 2021-02-20 – 2021-02-21 (×2): 10 mg via INTRAVENOUS
  Filled 2021-02-20 (×2): qty 1

## 2021-02-20 MED ORDER — LOPERAMIDE HCL 2 MG PO CAPS
2.0000 mg | ORAL_CAPSULE | Freq: Four times a day (QID) | ORAL | Status: DC | PRN
  Administered 2021-02-20 – 2021-02-21 (×3): 2 mg via ORAL
  Filled 2021-02-20 (×4): qty 1

## 2021-02-20 MED ORDER — LIDOCAINE HCL 1 % IJ SOLN
INTRAMUSCULAR | Status: AC
  Filled 2021-02-20: qty 20

## 2021-02-20 MED ORDER — SODIUM CHLORIDE 0.9 % IV SOLN
50.0000 mg | INTRAVENOUS | Status: DC
  Administered 2021-02-21: 50 mg via INTRAVENOUS
  Filled 2021-02-20: qty 2.5

## 2021-02-20 MED ORDER — LIDOCAINE HCL 1 % IJ SOLN
INTRAMUSCULAR | Status: DC | PRN
  Administered 2021-02-20: 10 mL

## 2021-02-20 MED ORDER — HYDRALAZINE HCL 25 MG PO TABS
25.0000 mg | ORAL_TABLET | Freq: Four times a day (QID) | ORAL | Status: DC | PRN
  Administered 2021-02-20: 25 mg via ORAL
  Filled 2021-02-20: qty 1

## 2021-02-20 MED ORDER — CARVEDILOL 6.25 MG PO TABS
6.2500 mg | ORAL_TABLET | Freq: Two times a day (BID) | ORAL | Status: DC
Start: 2021-02-20 — End: 2021-02-23
  Administered 2021-02-20 – 2021-02-22 (×5): 6.25 mg via ORAL
  Filled 2021-02-20 (×5): qty 1

## 2021-02-20 MED ORDER — DOXERCALCIFEROL 4 MCG/2ML IV SOLN: 5.0000 ug | INTRAVENOUS | Status: DC

## 2021-02-20 MED ORDER — PROSOURCE PLUS PO LIQD
30.0000 mL | Freq: Two times a day (BID) | ORAL | Status: DC
  Administered 2021-02-20 – 2021-02-22 (×5): 30 mL via ORAL
  Filled 2021-02-20 (×5): qty 30

## 2021-02-20 MED ORDER — CLONIDINE HCL 0.1 MG PO TABS: 0.1000 mg | ORAL_TABLET | Freq: Three times a day (TID) | ORAL | Status: DC | PRN

## 2021-02-20 MED ORDER — ALBUMIN HUMAN 25 % IV SOLN: 50.0000 g | Freq: Once | INTRAVENOUS | Status: DC | PRN

## 2021-02-20 NOTE — Progress Notes (Signed)
[name] Progress Note     Background: [name] Michael is a 60 Y/O female on hemodialysis T,Th,S  at [facility]. PMH: DM,HTN, CVA, + HIV screen but RNA < 62, AMS, resident of SNF, anemia of chronic kidney disease, SHPT. Recent admission 08/05-08/05/2021 for acute toxic encephalopathy due to ESBL E. Coli bactermia, likely source UTI. She presented to ED from SNF after missing HD 02/16/2021 with hypoxia and AMS. SCr 4.86 BUN 47 K+ 3.4. She was admitted as observation patient for hypoxia/AMS. HD 02/19/21. She is now in-patient.   Subjective: Seen in room. Patient is at baseline. She is asking for O2 to be increased to 4L/M but O2 sats 99-100%. Educated patient that O2 is a drug and we do not increase unless needed. No C/Os other than itchy scalp.   Objective Vitals:   02/20/21 0800 02/20/21 1125 02/20/21 1230 02/20/21 1300  BP: (!) 168/66 (!) 184/91 (!) 170/64 (!) 154/49  Pulse: 82 77 81 80  Resp: '14 15 17 16  '$ Temp:  98 F (36.7 C)    TempSrc:  Oral    SpO2: 96% 96% 99% 95%  Weight:       Physical Exam General: Small, chronically ill female in NAD Heart: [id number] 2/6 systolic M No R/G no JVD Lungs: CTAB Abdomen: NABS, NT Extremities:No LE edema Dialysis Access: L AVF +T/B bruising at bottom of AVF   Additional Objective Labs: Basic Metabolic Panel: Recent Labs  Lab 02/16/21 1439 02/18/21 2017 02/18/21 2035 02/19/21 0159  NA 133* 134* 133* 133*  K 3.1* 3.4* 3.4* 3.7  CL 91* 89*  --  92*  CO2 30 29  --  28  GLUCOSE 128* 124*  --  86  BUN 33* 47*  --  47*  CREATININE 3.31* 4.86*  --  4.79*  CALCIUM 8.8* 9.0  --  8.9  PHOS  --   --   --  5.7*   Liver Function Tests: Recent Labs  Lab 02/16/21 1439 02/18/21 2017 02/19/21 0159  AST '26 26 25  '$ ALT '14 12 13  '$ ALKPHOS 101 109 111  BILITOT 1.0 0.9 1.1  PROT 6.9 7.0 6.7  ALBUMIN 2.7* 2.8* 2.6*   Recent Labs  Lab 02/16/21 1439  LIPASE 32   CBC: Recent Labs  Lab 02/16/21 1439  02/18/21 2017 02/18/21 2035 02/19/21 0330  WBC 7.1 6.3  --  6.5  NEUTROABS 5.9 4.7  --   --   HGB 10.7* 11.2* 13.3 10.1*  HCT 34.6* 37.0 39.0 33.8*  MCV 88.5 91.4  --  90.6  PLT 251 214  --  206   Blood Culture    Component Value Date/Time   SDES URINE, CLEAN CATCH 02/17/2021 1403   SPECREQUEST  02/17/2021 1403    NONE Performed at [facility], [place] [address] [place], [place] [address]    CULT 40,000 COLONIES/mL ENTEROCOCCUS FAECALIS (A) 02/17/2021 1403   REPTSTATUS 02/19/2021 FINAL 02/17/2021 1403    Cardiac Enzymes: No results for input(s): CKTOTAL, CKMB, CKMBINDEX, TROPONINI in the last 168 hours. CBG: Recent Labs  Lab 02/19/21 1558 02/19/21 1915 02/20/21 0512 02/20/21 0725 02/20/21 1122  GLUCAP 118* 103* 77 100* 145*   Iron Studies: No results for input(s): IRON, TIBC, TRANSFERRIN, FERRITIN in the last 72 hours. '@[email or web address]'$ @ Studies/Results: CT HEAD WO CONTRAST  Result Date: 02/18/2021 CLINICAL DATA:  Encephalopathy EXAM: CT HEAD WITHOUT CONTRAST TECHNIQUE: Contiguous axial images were obtained from the base of the skull through the  vertex without intravenous contrast. COMPARISON:  None. FINDINGS: Brain: There is no mass, hemorrhage or extra-axial collection. There is generalized atrophy without lobar predilection. Hypodensity of the white matter is most commonly associated with chronic microvascular disease. There are old small vessel infarcts of the basal ganglia and thalamus. Vascular: No abnormal hyperdensity of the major intracranial arteries or dural venous sinuses. No intracranial atherosclerosis. Skull: The visualized skull base, calvarium and extracranial soft tissues are normal. Sinuses/Orbits: No fluid levels or advanced mucosal thickening of the visualized paranasal sinuses. No mastoid or middle ear effusion. The orbits are normal. IMPRESSION: 1. No acute intracranial abnormality. 2. Atrophy and chronic microvascular ischemia. 3. Old small vessel  infarcts of the basal ganglia and thalamus. Electronically Signed   By: Ulyses Jarred M.D.   On: 02/18/2021 20:57   CT Angio Chest PE W and/or Wo Contrast  Result Date: 02/18/2021 CLINICAL DATA:  Concern for pulmonary embolism.  Hypoxia. EXAM: CT ANGIOGRAPHY CHEST WITH CONTRAST TECHNIQUE: Multidetector CT imaging of the chest was performed using the standard protocol during bolus administration of intravenous contrast. Multiplanar CT image reconstructions and MIPs were obtained to evaluate the vascular anatomy. CONTRAST:  5m OMNIPAQUE IOHEXOL 350 MG/ML SOLN COMPARISON:  CT dated 01/25/2021. FINDINGS: Cardiovascular: Moderate cardiomegaly. No pericardial effusion. There is retrograde flow of contrast from the right atrium into the IVC consistent with right heart dysfunction. Moderate atherosclerotic calcification of the thoracic aorta. No aneurysmal dilatation. Non opacification of a small left upper lobe segmental branch (series 7 images 91-101) likely artifactual. A nonocclusive pulmonary artery embolus within this branch is less likely. No other pulmonary artery embolus identified. Mediastinum/Nodes: No hilar or mediastinal adenopathy. The esophagus is grossly unremarkable. Mediastinal fluid collection. Lungs/Pleura: Small bilateral pleural effusions, right greater than left. There is associated partial compressive atelectasis of the lower lobes. Pneumonia is not excluded clinical correlation is recommended. No pneumothorax. The central airways are patent. Upper Abdomen: Small ascites. Musculoskeletal: Diffuse subcutaneous edema and anasarca. Osteopenia. No acute osseous pathology. Old sternal fracture. Review of the MIP images confirms the above findings. IMPRESSION: 1. No definite CT evidence of pulmonary embolism. 2. Moderate cardiomegaly with evidence of right heart dysfunction. 3. Small bilateral pleural effusions, right greater than left, with associated partial compressive atelectasis of the lower  lobes. Pneumonia is not excluded clinical correlation is recommended. 4. Small ascites and anasarca. 5. Aortic Atherosclerosis (ICD10-I70.0). Electronically Signed   By: AAnner CreteM.D.   On: 02/18/2021 23:14   DG Chest Port 1 View  Result Date: 02/18/2021 CLINICAL DATA:  Lethargy. EXAM: PORTABLE CHEST 1 VIEW COMPARISON:  Chest radiograph dated 02/16/2021. FINDINGS: Faint right lung base density, likely atelectasis. No focal consolidation pleural effusion or pneumothorax. Cardiomegaly. No acute pathology. IMPRESSION: 1. No acute cardiopulmonary process. 2. Cardiomegaly. Electronically Signed   By: AAnner CreteM.D.   On: 02/18/2021 20:26   Medications:  albumin human      amoxicillin-clavulanate  1 tablet Oral QHS   aspirin  81 mg Oral Daily   atorvastatin  40 mg Oral QHS   carvedilol  3.125 mg Oral BID WC   Chlorhexidine Gluconate Cloth  6 each Topical Q0600   FLUoxetine  20 mg Oral Daily   heparin  5,000 Units Subcutaneous Q8H   levothyroxine  75 mcg Oral Q0600   midodrine  10 mg Oral Q T,Th,Sa-HD   multivitamin  1 tablet Oral QHS   pantoprazole  40 mg Oral Daily   sodium bicarbonate  1,300 mg Oral BID  sodium chloride flush  3 mL Intravenous Q12H   HD orders:  3 hr 45 min 180NRe 350/500 39.5 kg 2.0K/2.0 Ca AVF -Heparin 2000 units IV TIW -Hectorol 5 mcg IV TIW -Mircera 75 mcg IV q 4 weeks (last dose 30 mcg 02/02/2021) -Venofer 50 mg IV weekly  Assessment/Plan: 1. Acute on chronic respiratory failure: Increase O2 demands in setting of missed HD. CTA negative for PE. Probably related to volume overload in setting of missed HD. HD 02/19/2021 Net UF 4.5 L.  2. AMS-appears at baseline. Per primary 2. ESRD -T,TH,S. Next HD 02/21/2021 3. Anemia - HGB 10.1. ESA past due. Will give with HD tomorrow.  4. Secondary hyperparathyroidism -  5. HTN/volume - HD 02/20/2019. Net UF 4.5 L. Still above OP EDW. Continue lowering volume as tolerated. BP variable since admit. Increase  carvedilol to 6.25 mg PO BID 6. Nutrition - Albumin low. Soft diet with fluid restrictions. Protein supps.  7. UTI-40,000 colonies Enterococcus Faecalis in urine. Continues on Augmentin per primary  Caroll Cunnington H. Sadia Belfiore NP-C 02/20/2021, 2:17 PM  [employer] [phone]

## 2021-02-20 NOTE — Telephone Encounter (Signed)
Post ED Visit - Positive Culture Follow-up  Culture report reviewed by antimicrobial stewardship pharmacist: [place] Team '[]'$  Elenor Quinones, Pharm.D. '[]'$  Heide Guile, Pharm.D., BCPS AQ-ID '[]'$  Parks Neptune, Pharm.D., BCPS '[]'$  Alycia Rossetti, Pharm.D., BCPS '[]'$  [place], [place].D., BCPS, AAHIVP '[]'$  Legrand Como, Pharm.D., BCPS, AAHIVP '[]'$  Salome Arnt, PharmD, BCPS '[]'$  Johnnette Gourd, PharmD, BCPS '[]'$  Hughes Better, PharmD, BCPS '[]'$  Leeroy Cha, PharmD '[]'$  Laqueta Linden, PharmD, BCPS '[]'$  Albertina Parr, PharmD  [place] Team '[]'$  Leodis Sias, PharmD '[]'$  Lindell Spar, PharmD '[]'$  Royetta Asal, PharmD '[]'$  Graylin Shiver, Rph '[]'$  Rema Fendt) Glennon Mac, PharmD '[]'$  Arlyn Dunning, PharmD '[]'$  Netta Cedars, PharmD '[]'$  Dia Sitter, PharmD '[]'$  Leone Haven, PharmD '[]'$  Gretta Arab, PharmD '[]'$  Theodis Shove, PharmD '[]'$  Peggyann Juba, PharmD '[]'$  Reuel Boom, PharmD   Positive urine culture Treated with Amoxicillin, organism sensitive to the same and no further patient follow-up is required at this time. Marko Plume, PharmD  Harlon Flor Talley 02/20/2021, 10:23 AM

## 2021-02-20 NOTE — Plan of Care (Signed)

## 2021-02-20 NOTE — Progress Notes (Signed)
PROGRESS NOTE                                                                                                                                                                                                             Patient Demographics:    [name] Michael, is a 60 y.o. female, DOB - 09/07/1960, DW:[id number]  Outpatient Primary MD for the patient is Charlott Rakes, MD    LOS - 1  Admit date - 02/18/2021    Chief Complaint  Patient presents with   Fatigue       Brief Narrative (HPI from H&P)    [name] Michael is a 60 y.o. female with medical history significant of ESRD on HD TTS, CVA, hyperlipidemia, hypertension, diabetes, CHF, anemia, hypothyroidism, depression, GERD, prolonged QT, syncope, cytopenia who presents from her facility with hypoxia and altered mental status after she could not undergo her full HD session 2 days ago outpt due to > discomfort.    Subjective:   Patient in bed, appears comfortable, denies any headache, no fever, no chest pain or pressure, no shortness of breath , no abdominal pain. No new focal weakness.    Assessment  & Plan :     Acute toxic encephalopathy and acute on chronic hypoxic respiratory failure due to fluid overload from missed HD session - she only did 30 minutes of outpatient dialysis on last Saturday that is 2 days prior to hospital admission and had signs of uremia along with fluid overload causing her symptoms, nephrology has been consulted she is now undergoing HD TTS and already feeling better.  Continue supportive care and monitor.  Note she uses 2 to 3 L oxygen at baseline.  2.  ESRD with chronic mild to moderate abdominal ascites - nephrology on board on TTS schedule.     3.  Acute on chronic diastolic CHF last EF [id number] on recent echocardiogram.  Fluid removal through HD and ascites fluid removal through paracentesis  4.  Anemia of chronic disease.  Stable.  5.  Chronic  thrombocytopenia.  Stable monitor.  6.  Hypertension.  Blood pressure elevated on Coreg, hydralazine and Catapres added, after paracentesis if blood pressure is still elevated will adjust oral blood pressure medications further.  Defer as needed midodrine to nephrology on the dialysis days.  7.  History of CVA.  On  aspirin and statin for secondary prevention.  8.  Dyslipidemia.  Continue statin.  .  9.  Hypothyroidism with stable TSH currently on Synthroid.  10. Severe protein calorie malnutrition with massive third spacing of fluid.  Supportive care protein supplements.    11. Recent outpatient diagnosis of UTI -  on Augmentin finish the course.     12. GERD - PPI.  13. Ascites  - likely due to #10, SBP was ruled out recently, will request US guided paracentesis for therapeutic fluid removal, her abdomen is distended but nontender, will try maximum fluid possible with albumin.    14. DM type II.  Sliding scale.  Lab Results  Component Value Date   HGBA1C 5.9 (H) 10/14/2020   CBG (last 3)  Recent Labs    02/19/21 1915 02/20/21 0512 02/20/21 0725  GLUCAP 103* 77 100*   Lab Results  Component Value Date   TSH 5.240 (H) 01/26/2021         Condition - Extremely Guarded  Family Communication  : None present   Code Status :  DNR  Consults  :  Renal    PUD Prophylaxis : PPI   Procedures  :     CT head.  Nonacute.    CT A Chest  - No PE      Disposition Plan  :    Status is: Inpatient  Remains inpatient appropriate because:IV treatments appropriate due to intensity of illness or inability to take PO  Dispo: The patient is from: SNF              Anticipated d/c is to: SNF              Patient currently is not medically stable to d/c.   Difficult to place patient No   DVT Prophylaxis  :  Heparin added  heparin injection 5,000 Units Start: 02/19/21 1400   Lab Results  Component Value Date   PLT 206 02/19/2021    Diet :  Diet Order              DIET SOFT Room service appropriate? Yes; Fluid consistency: Nectar Thick  Diet effective now                    Inpatient Medications  Scheduled Meds:  amoxicillin-clavulanate  1 tablet Oral QHS   aspirin  81 mg Oral Daily   atorvastatin  40 mg Oral QHS   carvedilol  3.125 mg Oral BID WC   Chlorhexidine Gluconate Cloth  6 each Topical Q0600   FLUoxetine  20 mg Oral Daily   heparin  5,000 Units Subcutaneous Q8H   levothyroxine  75 mcg Oral Q0600   midodrine  10 mg Oral Q T,Th,Sa-HD   multivitamin  1 tablet Oral QHS   pantoprazole  40 mg Oral Daily   sodium bicarbonate  1,300 mg Oral BID   sodium chloride flush  3 mL Intravenous Q12H   Continuous Infusions:  albumin human      PRN Meds:.acetaminophen **OR** acetaminophen, albumin human, albuterol, cloNIDine, hydrALAZINE, polyethylene glycol  Antibiotics  :    Anti-infectives (From admission, onward)    Start     Dose/Rate Route Frequency Ordered Stop   02/19/21 2200  amoxicillin-clavulanate (AUGMENTIN) 500-125 MG per tablet 500 mg  Status:  Discontinued        1 tablet Oral Daily at bedtime 02/19/21 1417 02/19/21 1418   02/19/21 2200  amoxicillin-clavulanate (AUGMENTIN) 500-125 MG per tablet 500  mg        1 tablet Oral Daily at bedtime 02/19/21 1418 02/24/21 2159   02/19/21 0130  amoxicillin-clavulanate (AUGMENTIN) 875-125 MG per tablet 1 tablet  Status:  Discontinued        1 tablet Oral Daily at bedtime 02/19/21 0035 02/19/21 1416   02/18/21 2330  ceFEPIme (MAXIPIME) 2 g in sodium chloride 0.9 % 100 mL IVPB        2 g 200 mL/hr over 30 Minutes Intravenous  Once 02/18/21 2329 02/19/21 0022        Time Spent in minutes  30   Lala Lund M.D on 02/20/2021 at 9:07 AM  To page go to [email or web address]   [facility] -  Office  [phone]   See all Orders from today for further details    Objective:   Vitals:   02/20/21 0000 02/20/21 0400 02/20/21 0729 02/20/21 0800  BP: (!) 172/76 (!) 190/87 (!)  186/94 (!) 168/66  Pulse: 75 80 82 82  Resp: '12 14 16 14  '$ Temp:   98.1 F (36.7 C)   TempSrc:   Oral   SpO2: 92% 95% 95% 96%  Weight:        Wt Readings from Last 3 Encounters:  02/19/21 41.7 kg  02/01/21 39 kg  01/23/21 46.4 kg     Intake/Output Summary (Last 24 hours) at 02/20/2021 0907 Last data filed at 02/20/2021 0811 Gross per 24 hour  Intake 480 ml  Output 4500 ml  Net -4020 ml     Physical Exam  Awake Alert, No new F.N deficits,   [place].AT,PERRAL Supple Neck,No JVD, No cervical lymphadenopathy appriciated.  Symmetrical Chest wall movement, Good air movement bilaterally, CTAB RRR,No Gallops, Rubs or new Murmurs, No Parasternal Heave +ve B.Sounds, Abd distended, No tenderness,   No Cyanosis, Clubbing or edema, No new Rash or bruise    Data Review:    CBC Recent Labs  Lab 02/16/21 1439 02/18/21 2017 02/18/21 2035 02/19/21 0330  WBC 7.1 6.3  --  6.5  HGB 10.7* 11.2* 13.3 10.1*  HCT 34.6* 37.0 39.0 33.8*  PLT 251 214  --  206  MCV 88.5 91.4  --  90.6  MCH 27.4 27.7  --  27.1  MCHC 30.9 30.3  --  29.9*  RDW 21.1* 20.8*  --  21.0*  LYMPHSABS 0.7 0.8  --   --   MONOABS 0.3 0.6  --   --   EOSABS 0.1 0.2  --   --   BASOSABS 0.1 0.1  --   --     Recent Labs  Lab 02/16/21 1439 02/16/21 1833 02/16/21 2023 02/18/21 2017 02/18/21 2035 02/19/21 0028 02/19/21 0159  NA 133*  --   --  134* 133*  --  133*  K 3.1*  --   --  3.4* 3.4*  --  3.7  CL 91*  --   --  89*  --   --  92*  CO2 30  --   --  29  --   --  28  GLUCOSE 128*  --   --  124*  --   --  86  BUN 33*  --   --  47*  --   --  47*  CREATININE 3.31*  --   --  4.86*  --   --  4.79*  CALCIUM 8.8*  --   --  9.0  --   --  8.9  AST 26  --   --  26  --   --  25  ALT 14  --   --  12  --   --  13  ALKPHOS 101  --   --  109  --   --  111  BILITOT 1.0  --   --  0.9  --   --  1.1  ALBUMIN 2.7*  --   --  2.8*  --   --  2.6*  MG  --   --   --   --   --   --  2.3  LATICACIDVEN  --  2.4* 1.9  --   --  1.7  --    AMMONIA  --   --   --  31  --   --   --   BNP  --   --   --   --   --  >4,500.0*  --     ------------------------------------------------------------------------------------------------------------------ No results for input(s): CHOL, HDL, LDLCALC, TRIG, CHOLHDL, LDLDIRECT in the last 72 hours.  Lab Results  Component Value Date   HGBA1C 5.9 (H) 10/14/2020   ------------------------------------------------------------------------------------------------------------------ No results for input(s): TSH, T4TOTAL, T3FREE, THYROIDAB in the last 72 hours.  Invalid input(s): FREET3   Cardiac Enzymes No results for input(s): CKMB, TROPONINI, MYOGLOBIN in the last 168 hours.  Invalid input(s): CK ------------------------------------------------------------------------------------------------------------------    Component Value Date/Time   BNP >4,500.0 (H) 02/19/2021 0028    Radiology Reports    CT HEAD WO CONTRAST  Result Date: 02/18/2021 CLINICAL DATA:  Encephalopathy EXAM: CT HEAD WITHOUT CONTRAST TECHNIQUE: Contiguous axial images were obtained from the base of the skull through the vertex without intravenous contrast. COMPARISON:  None. FINDINGS: Brain: There is no mass, hemorrhage or extra-axial collection. There is generalized atrophy without lobar predilection. Hypodensity of the white matter is most commonly associated with chronic microvascular disease. There are old small vessel infarcts of the basal ganglia and thalamus. Vascular: No abnormal hyperdensity of the major intracranial arteries or dural venous sinuses. No intracranial atherosclerosis. Skull: The visualized skull base, calvarium and extracranial soft tissues are normal. Sinuses/Orbits: No fluid levels or advanced mucosal thickening of the visualized paranasal sinuses. No mastoid or middle ear effusion. The orbits are normal. IMPRESSION: 1. No acute intracranial abnormality. 2. Atrophy and chronic microvascular  ischemia. 3. Old small vessel infarcts of the basal ganglia and thalamus. Electronically Signed   By: Ulyses Jarred M.D.   On: 02/18/2021 20:57      CT Angio Chest PE W and/or Wo Contrast  Result Date: 02/18/2021 CLINICAL DATA:  Concern for pulmonary embolism.  Hypoxia. EXAM: CT ANGIOGRAPHY CHEST WITH CONTRAST TECHNIQUE: Multidetector CT imaging of the chest was performed using the standard protocol during bolus administration of intravenous contrast. Multiplanar CT image reconstructions and MIPs were obtained to evaluate the vascular anatomy. CONTRAST:  36m OMNIPAQUE IOHEXOL 350 MG/ML SOLN COMPARISON:  CT dated 01/25/2021. FINDINGS: Cardiovascular: Moderate cardiomegaly. No pericardial effusion. There is retrograde flow of contrast from the right atrium into the IVC consistent with right heart dysfunction. Moderate atherosclerotic calcification of the thoracic aorta. No aneurysmal dilatation. Non opacification of a small left upper lobe segmental branch (series 7 images 91-101) likely artifactual. A nonocclusive pulmonary artery embolus within this branch is less likely. No other pulmonary artery embolus identified. Mediastinum/Nodes: No hilar or mediastinal adenopathy. The esophagus is grossly unremarkable. Mediastinal fluid collection. Lungs/Pleura: Small bilateral pleural effusions, right greater than left. There is associated partial compressive atelectasis of the lower lobes. Pneumonia is  not excluded clinical correlation is recommended. No pneumothorax. The central airways are patent. Upper Abdomen: Small ascites. Musculoskeletal: Diffuse subcutaneous edema and anasarca. Osteopenia. No acute osseous pathology. Old sternal fracture. Review of the MIP images confirms the above findings. IMPRESSION: 1. No definite CT evidence of pulmonary embolism. 2. Moderate cardiomegaly with evidence of right heart dysfunction. 3. Small bilateral pleural effusions, right greater than left, with associated partial  compressive atelectasis of the lower lobes. Pneumonia is not excluded clinical correlation is recommended. 4. Small ascites and anasarca. 5. Aortic Atherosclerosis (ICD10-I70.0). Electronically Signed   By: Anner Crete M.D.   On: 02/18/2021 23:14     DG Chest Port 1 View  Result Date: 02/18/2021 CLINICAL DATA:  Lethargy. EXAM: PORTABLE CHEST 1 VIEW COMPARISON:  Chest radiograph dated 02/16/2021. FINDINGS: Faint right lung base density, likely atelectasis. No focal consolidation pleural effusion or pneumothorax. Cardiomegaly. No acute pathology. IMPRESSION: 1. No acute cardiopulmonary process. 2. Cardiomegaly. Electronically Signed   By: Anner Crete M.D.   On: 02/18/2021 20:26

## 2021-02-20 NOTE — Procedures (Signed)
PROCEDURE SUMMARY:  Successful image-guided paracentesis from the right upper abdomen.  Yielded 2.2 liters of serosanguineou fluid.  No immediate complications.  EBL < 1 mL Patient tolerated well.   Specimen was not sent for labs.  Please see imaging section of Epic for full dictation.  Joaquim Nam PA-C 02/20/2021 3:57 PM

## 2021-02-20 NOTE — NC FL2 (Signed)
[place] MEDICAID FL2 LEVEL OF CARE SCREENING TOOL     IDENTIFICATION  Patient Name: [name] Michael Birthdate: [birth year] Sex: female Admission Date (Current Location): 02/18/2021  [facility] [place] and [place] Number:  Herbalist and Address:  [facility] [place], [place] [address], [place], [place] [address]      Provider Number: [id number]  Attending Physician Name and Address:  Thurnell Lose, MD  Relative Name and Phone Number:  Deloria Lair [phone]    Current Level of Care: Hospital Recommended Level of Care: [place] Prior Approval Number:    Date Approved/Denied:   PASRR Number: [id number]  Discharge Plan: SNF    Current Diagnoses: Patient Active Problem List   Diagnosis Date Noted   Acute on chronic respiratory failure with hypoxia ([place]) 02/18/2021   Pressure injury of skin 02/17/2021   Abdominal ascites 01/25/2021   Lymphocytopenia 10/13/2020   Ear hematoma, right, initial encounter 10/13/2020   Prolonged QT interval 10/13/2020   Lactic acidosis 10/05/2020   Transaminitis 10/05/2020   Thrombocytopenia ([place]) 10/05/2020   Acute respiratory failure with hypoxia ([place]) 10/05/2020   Altered mental status 0000000   Acute metabolic encephalopathy [id number]   Severe tricuspid regurgitation 12/04/2019   ESRD (end stage renal disease) (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   AMS (altered mental status) 04/28/2019   Hypoxemia 04/28/2019   Fall 06/09/2018   Fall at home, initial encounter 06/09/2018   Anemia of chronic disease 06/09/2018   Syncope and collapse 06/09/2018   Acute encephalopathy 03/10/2018   Hypermagnesemia 03/10/2018   Acute lower UTI 11/09/2016   Uncontrolled type 2 diabetes mellitus with complication ([place])    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 associated with diabetes mellitus due  to underlying condition (HCC)    Chronic diastolic CHF (congestive heart failure) ([place])    Hypokalemia 06/11/2016   Proliferative diabetic retinopathy ([place]) 04/29/2016   Poor social situation 03/09/2013   Depression 03/01/2013   Abnormal mammogram 12/20/2012   Retinal detachment 11/17/2012   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 ([place]) [id number]   Hyperlipidemia 02/09/2007   Essential hypertension 02/09/2007    Orientation RESPIRATION BLADDER Height & Weight     Self, Time, Situation, Place  Normal Incontinent (Olig[place]) Weight: 41.7 kg Height:     BEHAVIORAL SYMPTOMS/MOOD NEUROLOGICAL BOWEL NUTRITION STATUS      Incontinent Diet (Regular soft with Nectar thick 1278m fluid restriction)  AMBULATORY STATUS COMMUNICATION OF NEEDS Skin   Extensive Assist Verbally  (stage II noted to sacrum)   PU Stage 2 Dressing: No Dressing                   Personal Care Assistance Level of Assistance  Bathing, Dressing, Feeding Bathing Assistance: Limited assistance Feeding assistance: Limited assistance Dressing Assistance: Limited assistance     Functional Limitations Info  Sight, Hearing, Speech Sight Info: Adequate Hearing Info: Adequate Speech Info: Impaired (delayed responses)    SPECIAL CARE FACTORS FREQUENCY  PT (By licensed PT), OT (By licensed OT)     PT Frequency: 3X OT Frequency: 3X            Contractures Contractures Info: Not present    Additional Factors Info  Code Status, Allergies, Isolation Precautions, Insulin Sliding Scale, Psychotropic Code Status Info: DNR Allergies Info: Hydrocodone Psychotropic Info: See discharge  summary Insulin Sliding Scale Info: See d/c summary for sliding scale information Isolation Precautions Info: Contact- ESBL     Current Medications (02/20/2021):  This is the current hospital active medication list Current Facility-Administered Medications  Medication Dose Route Frequency Provider Last  Rate Last Admin   (feeding supplement) PROSource Plus liquid 30 mL  30 mL Oral BID BM Valentina Gu, NP       acetaminophen (TYLENOL) tablet 650 mg  650 mg Oral Q6H PRN Marcelyn Bruins, MD   650 mg at 02/19/21 2259   Or   acetaminophen (TYLENOL) suppository 650 mg  650 mg Rectal Q6H PRN Marcelyn Bruins, MD       albumin human 25 % solution 50 g  50 g Intravenous Once PRN Thurnell Lose, MD       albuterol (PROVENTIL) (2.5 MG/3ML) 0.083% nebulizer solution 2.5 mg  2.5 mg Nebulization Q4H PRN Marcelyn Bruins, MD       amoxicillin-clavulanate (AUGMENTIN) 500-125 MG per tablet 500 mg  1 tablet Oral QHS Thurnell Lose, MD   500 mg at 02/19/21 2258   aspirin chewable tablet 81 mg  81 mg Oral Daily Marcelyn Bruins, MD   81 mg at 02/20/21 [id number]   atorvastatin (LIPITOR) tablet 40 mg  40 mg Oral QHS Marcelyn Bruins, MD       carvedilol (COREG) tablet 6.25 mg  6.25 mg Oral BID WC Valentina Gu, NP       Chlorhexidine Gluconate Cloth 2 % PADS 6 each  6 each Topical Q0600 Edrick Oh, MD   6 each at 02/20/21 [id number]   cloNIDine (CATAPRES) tablet 0.1 mg  0.1 mg Oral Q8H PRN Thurnell Lose, MD       [START ON 02/21/2021] Darbepoetin Alfa (ARANESP) injection 25 mcg  25 mcg Intravenous Q Thu-HD Valentina Gu, NP       Derrill Memo ON 02/21/2021] doxercalciferol (HECTOROL) injection 5 mcg  5 mcg Intravenous Q T,Th,Sa-HD Valentina Gu, NP       FLUoxetine (PROZAC) capsule 20 mg  20 mg Oral Daily Marcelyn Bruins, MD   20 mg at 02/20/21 0959   heparin injection 5,000 Units  5,000 Units Subcutaneous Q8H Marcelyn Bruins, MD   5,000 Units at 02/20/21 1427   hydrALAZINE (APRESOLINE) injection 10 mg  10 mg Intravenous Q6H PRN Thurnell Lose, MD   10 mg at 02/20/21 1211   [START ON 02/21/2021] iron sucrose (VENOFER) 50 mg in sodium chloride 0.9 % 100 mL IVPB  50 mg Intravenous Weekly Valentina Gu, NP       levothyroxine (SYNTHROID) tablet 75 mcg  75 mcg Oral Q0600  Marcelyn Bruins, MD   75 mcg at 02/20/21 [id number]   loperamide (IMODIUM) capsule 2 mg  2 mg Oral Q6H PRN Thurnell Lose, MD   2 mg at 02/20/21 1211   midodrine (PROAMATINE) tablet 10 mg  10 mg Oral Q T,Th,Sa-HD Marcelyn Bruins, MD       multivitamin (RENA-VIT) tablet 1 tablet  1 tablet Oral QHS Marcelyn Bruins, MD   1 tablet at 02/19/21 2144   pantoprazole (PROTONIX) EC tablet 40 mg  40 mg Oral Daily Marcelyn Bruins, MD   40 mg at 02/20/21 0959   polyethylene glycol (MIRALAX / GLYCOLAX) packet 17 g  17 g Oral Daily PRN Marcelyn Bruins, MD       sodium chloride flush (NS) 0.9 % injection  3 mL  3 mL Intravenous Q12H Marcelyn Bruins, MD   3 mL at 02/20/21 [id number]     Discharge Medications: Please see discharge summary for a list of discharge medications.  Relevant Imaging Results:  Relevant Lab Results:   Additional Information SSN [id number]  Angelita Ingles, RN

## 2021-02-21 LAB — SARS CORONAVIRUS 2 (TAT 6-24 HRS): SARS Coronavirus 2: NEGATIVE

## 2021-02-21 LAB — GLUCOSE, CAPILLARY
Glucose-Capillary: 106 mg/dL — ABNORMAL HIGH (ref 70–99)
Glucose-Capillary: 110 mg/dL — ABNORMAL HIGH (ref 70–99)
Glucose-Capillary: 158 mg/dL — ABNORMAL HIGH (ref 70–99)
Glucose-Capillary: 208 mg/dL — ABNORMAL HIGH (ref 70–99)

## 2021-02-21 LAB — CULTURE, BLOOD (ROUTINE X 2)
Culture: NO GROWTH
Special Requests: ADEQUATE

## 2021-02-21 NOTE — Progress Notes (Signed)
PROGRESS NOTE                                                                                                                                                                                                             Patient Demographics:    [name] Michael, is a 60 y.o. female, DOB - 20-Sep-1960, [id number]  Outpatient Primary MD for the patient is Charlott Rakes, MD    LOS - 2  Admit date - 02/18/2021    Chief Complaint  Patient presents with   Fatigue       Brief Narrative (HPI from H&P)    [name] Michael is a 60 y.o. female with medical history significant of ESRD on HD TTS, CVA, hyperlipidemia, hypertension, diabetes, CHF, anemia, hypothyroidism, depression, GERD, prolonged QT, syncope, cytopenia who presents from her facility with hypoxia and altered mental status after she could not undergo her full HD session 2 days ago outpt due to > discomfort.    Subjective:   Patient in bed, appears comfortable, denies any headache, no fever, no chest pain or pressure, no shortness of breath , no abdominal pain. No new focal weakness.   Assessment  & Plan :     Acute toxic encephalopathy and acute on chronic hypoxic respiratory failure due to fluid overload from missed HD session - she only did 30 minutes of outpatient dialysis on last Saturday that is 2 days prior to hospital admission and had signs of uremia along with fluid overload causing her symptoms, nephrology has been consulted she is now undergoing HD TTS and already feeling better.  Continue supportive care and monitor.  Note she uses 2 to 3 L oxygen at baseline.  2.  ESRD with chronic mild to moderate abdominal ascites - nephrology on board on TTS schedule.     3.  Acute on chronic diastolic CHF last EF [id number] on recent echocardiogram.  Fluid removal through HD and ascites fluid removal through paracentesis  4.  Anemia of chronic disease.  Stable.  5.  Chronic  thrombocytopenia.  Stable monitor.  6.  Hypertension.  Blood pressure elevated on Coreg, hydralazine and Catapres added, after paracentesis if blood pressure is still elevated will adjust oral blood pressure medications further.  Defer as needed midodrine to nephrology on the dialysis days.  7.  History of CVA.  On aspirin  and statin for secondary prevention.  8.  Dyslipidemia.  Continue statin.  .  9.  Hypothyroidism with stable TSH currently on Synthroid.  10. Severe protein calorie malnutrition with massive third spacing of fluid.  Supportive care protein supplements.    11. Recent outpatient diagnosis of UTI -  on Augmentin finish the course.     12. GERD - PPI.  13. Ascites  - likely due to #10, SBP was ruled out recently, is s/p ultrasound-guided paracentesis with 2.2 L of fluid removal on 02/20/2021, much improved abdominal distention and feels much better in terms of her breathing.  14. DM type II.  Sliding scale.  Lab Results  Component Value Date   HGBA1C 5.9 (H) 10/14/2020   CBG (last 3)  Recent Labs    02/20/21 1930 02/21/21 0349 02/21/21 0802  GLUCAP 154* 106* 110*   Lab Results  Component Value Date   TSH 5.240 (H) 01/26/2021         Condition - Extremely Guarded  Family Communication  : None present   Code Status :  DNR  Consults  :  Renal    PUD Prophylaxis : PPI   Procedures  :     Ultrasound-guided paracentesis with 2.2 L of serosanguineous fluid removed on 02/20/2021.    CT head.  Nonacute.    CT A Chest  - No PE      Disposition Plan  :    Status is: Inpatient  Remains inpatient appropriate because:IV treatments appropriate due to intensity of illness or inability to take PO  Dispo: The patient is from: SNF              Anticipated d/c is to: SNF on 02/22/21 - CM informed 02/21/21              Patient currently is medically stable to d/c.   Difficult to place patient No   DVT Prophylaxis  :  Heparin added  heparin injection 5,000  Units Start: 02/19/21 1400   Lab Results  Component Value Date   PLT 206 02/19/2021    Diet :  Diet Order             DIET SOFT Room service appropriate? Yes; Fluid consistency: Nectar Thick; Fluid restriction: 1200 mL Fluid  Diet effective now                    Inpatient Medications  Scheduled Meds:  (feeding supplement) PROSource Plus  30 mL Oral BID BM   amoxicillin-clavulanate  1 tablet Oral QHS   aspirin  81 mg Oral Daily   atorvastatin  40 mg Oral QHS   carvedilol  6.25 mg Oral BID WC   Chlorhexidine Gluconate Cloth  6 each Topical Q0600   darbepoetin (ARANESP) injection - DIALYSIS  25 mcg Intravenous Q Thu-HD   doxercalciferol  5 mcg Intravenous Q T,Th,Sa-HD   FLUoxetine  20 mg Oral Daily   heparin  5,000 Units Subcutaneous Q8H   levothyroxine  75 mcg Oral Q0600   midodrine  10 mg Oral Q T,Th,Sa-HD   multivitamin  1 tablet Oral QHS   pantoprazole  40 mg Oral Daily   sodium chloride flush  3 mL Intravenous Q12H   Continuous Infusions:  albumin human     iron sucrose 50 mg (02/21/21 0844)    PRN Meds:.acetaminophen **OR** acetaminophen, albumin human, albuterol, cloNIDine, hydrALAZINE, lidocaine, loperamide, polyethylene glycol  Antibiotics  :    Anti-infectives (From admission, onward)  Start     Dose/Rate Route Frequency Ordered Stop   02/19/21 2200  amoxicillin-clavulanate (AUGMENTIN) 500-125 MG per tablet 500 mg  Status:  Discontinued        1 tablet Oral Daily at bedtime 02/19/21 1417 02/19/21 1418   02/19/21 2200  amoxicillin-clavulanate (AUGMENTIN) 500-125 MG per tablet 500 mg        1 tablet Oral Daily at bedtime 02/19/21 1418 02/24/21 2159   02/19/21 0130  amoxicillin-clavulanate (AUGMENTIN) 875-125 MG per tablet 1 tablet  Status:  Discontinued        1 tablet Oral Daily at bedtime 02/19/21 0035 02/19/21 1416   02/18/21 2330  ceFEPIme (MAXIPIME) 2 g in sodium chloride 0.9 % 100 mL IVPB        2 g 200 mL/hr over 30 Minutes Intravenous  Once  02/18/21 2329 02/19/21 0022        Time Spent in minutes  30   Lala Lund M.D on 02/21/2021 at 9:18 AM  To page go to [email or web address]   [facility] -  Office  [phone]   See all Orders from today for further details    Objective:   Vitals:   02/20/21 2325 02/21/21 0345 02/21/21 0400 02/21/21 0800  BP: (!) 158/75 (!) 133/50 (!) 168/75 (!) 168/66  Pulse: 71 76 75 78  Resp: '11 17 11 14  '$ Temp: 98.6 F (37 C) 98 F (36.7 C)  98.1 F (36.7 C)  TempSrc: Oral Axillary  Oral  SpO2: 90% 98% 94% 96%  Weight:        Wt Readings from Last 3 Encounters:  02/19/21 41.7 kg  02/01/21 39 kg  01/23/21 46.4 kg     Intake/Output Summary (Last 24 hours) at 02/21/2021 0918 Last data filed at 02/21/2021 0640 Gross per 24 hour  Intake 420 ml  Output --  Net 420 ml     Physical Exam  Awake Alert, No new F.N deficits, Normal affect [place].AT,PERRAL Supple Neck,No JVD, No cervical lymphadenopathy appriciated.  Symmetrical Chest wall movement, Good air movement bilaterally, CTAB RRR,No Gallops, Rubs or new Murmurs, No Parasternal Heave +ve B.Sounds, Abd Soft, No tenderness, No organomegaly appriciated, No rebound - guarding or rigidity. No Cyanosis, Clubbing or edema, No new Rash or bruise     Data Review:    CBC Recent Labs  Lab 02/16/21 1439 02/18/21 2017 02/18/21 2035 02/19/21 0330  WBC 7.1 6.3  --  6.5  HGB 10.7* 11.2* 13.3 10.1*  HCT 34.6* 37.0 39.0 33.8*  PLT 251 214  --  206  MCV 88.5 91.4  --  90.6  MCH 27.4 27.7  --  27.1  MCHC 30.9 30.3  --  29.9*  RDW 21.1* 20.8*  --  21.0*  LYMPHSABS 0.7 0.8  --   --   MONOABS 0.3 0.6  --   --   EOSABS 0.1 0.2  --   --   BASOSABS 0.1 0.1  --   --     Recent Labs  Lab 02/16/21 1439 02/16/21 1833 02/16/21 2023 02/18/21 2017 02/18/21 2035 02/19/21 0028 02/19/21 0159  NA 133*  --   --  134* 133*  --  133*  K 3.1*  --   --  3.4* 3.4*  --  3.7  CL 91*  --   --  89*  --   --  92*  CO2 30  --   --  29  --    --  28  GLUCOSE 128*  --   --  124*  --   --  86  BUN 33*  --   --  47*  --   --  47*  CREATININE 3.31*  --   --  4.86*  --   --  4.79*  CALCIUM 8.8*  --   --  9.0  --   --  8.9  AST 26  --   --  26  --   --  25  ALT 14  --   --  12  --   --  13  ALKPHOS 101  --   --  109  --   --  111  BILITOT 1.0  --   --  0.9  --   --  1.1  ALBUMIN 2.7*  --   --  2.8*  --   --  2.6*  MG  --   --   --   --   --   --  2.3  LATICACIDVEN  --  2.4* 1.9  --   --  1.7  --   AMMONIA  --   --   --  31  --   --   --   BNP  --   --   --   --   --  >4,500.0*  --     ------------------------------------------------------------------------------------------------------------------ No results for input(s): CHOL, HDL, LDLCALC, TRIG, CHOLHDL, LDLDIRECT in the last 72 hours.  Lab Results  Component Value Date   HGBA1C 5.9 (H) 10/14/2020   ------------------------------------------------------------------------------------------------------------------ No results for input(s): TSH, T4TOTAL, T3FREE, THYROIDAB in the last 72 hours.  Invalid input(s): FREET3   Cardiac Enzymes No results for input(s): CKMB, TROPONINI, MYOGLOBIN in the last 168 hours.  Invalid input(s): CK ------------------------------------------------------------------------------------------------------------------    Component Value Date/Time   BNP >4,500.0 (H) 02/19/2021 0028    Radiology Reports    CT HEAD WO CONTRAST  Result Date: 02/18/2021 CLINICAL DATA:  Encephalopathy EXAM: CT HEAD WITHOUT CONTRAST TECHNIQUE: Contiguous axial images were obtained from the base of the skull through the vertex without intravenous contrast. COMPARISON:  None. FINDINGS: Brain: There is no mass, hemorrhage or extra-axial collection. There is generalized atrophy without lobar predilection. Hypodensity of the white matter is most commonly associated with chronic microvascular disease. There are old small vessel infarcts of the basal ganglia and thalamus.  Vascular: No abnormal hyperdensity of the major intracranial arteries or dural venous sinuses. No intracranial atherosclerosis. Skull: The visualized skull base, calvarium and extracranial soft tissues are normal. Sinuses/Orbits: No fluid levels or advanced mucosal thickening of the visualized paranasal sinuses. No mastoid or middle ear effusion. The orbits are normal. IMPRESSION: 1. No acute intracranial abnormality. 2. Atrophy and chronic microvascular ischemia. 3. Old small vessel infarcts of the basal ganglia and thalamus. Electronically Signed   By: Ulyses Jarred M.D.   On: 02/18/2021 20:57      CT Angio Chest PE W and/or Wo Contrast  Result Date: 02/18/2021 CLINICAL DATA:  Concern for pulmonary embolism.  Hypoxia. EXAM: CT ANGIOGRAPHY CHEST WITH CONTRAST TECHNIQUE: Multidetector CT imaging of the chest was performed using the standard protocol during bolus administration of intravenous contrast. Multiplanar CT image reconstructions and MIPs were obtained to evaluate the vascular anatomy. CONTRAST:  55m OMNIPAQUE IOHEXOL 350 MG/ML SOLN COMPARISON:  CT dated 01/25/2021. FINDINGS: Cardiovascular: Moderate cardiomegaly. No pericardial effusion. There is retrograde flow of contrast from the right atrium into the IVC consistent with right heart dysfunction. Moderate atherosclerotic calcification of the thoracic aorta. No aneurysmal dilatation. Non  opacification of a small left upper lobe segmental branch (series 7 images 91-101) likely artifactual. A nonocclusive pulmonary artery embolus within this branch is less likely. No other pulmonary artery embolus identified. Mediastinum/Nodes: No hilar or mediastinal adenopathy. The esophagus is grossly unremarkable. Mediastinal fluid collection. Lungs/Pleura: Small bilateral pleural effusions, right greater than left. There is associated partial compressive atelectasis of the lower lobes. Pneumonia is not excluded clinical correlation is recommended. No  pneumothorax. The central airways are patent. Upper Abdomen: Small ascites. Musculoskeletal: Diffuse subcutaneous edema and anasarca. Osteopenia. No acute osseous pathology. Old sternal fracture. Review of the MIP images confirms the above findings. IMPRESSION: 1. No definite CT evidence of pulmonary embolism. 2. Moderate cardiomegaly with evidence of right heart dysfunction. 3. Small bilateral pleural effusions, right greater than left, with associated partial compressive atelectasis of the lower lobes. Pneumonia is not excluded clinical correlation is recommended. 4. Small ascites and anasarca. 5. Aortic Atherosclerosis (ICD10-I70.0). Electronically Signed   By: Anner Crete M.D.   On: 02/18/2021 23:14     DG Chest Port 1 View  Result Date: 02/18/2021 CLINICAL DATA:  Lethargy. EXAM: PORTABLE CHEST 1 VIEW COMPARISON:  Chest radiograph dated 02/16/2021. FINDINGS: Faint right lung base density, likely atelectasis. No focal consolidation pleural effusion or pneumothorax. Cardiomegaly. No acute pathology. IMPRESSION: 1. No acute cardiopulmonary process. 2. Cardiomegaly. Electronically Signed   By: Anner Crete M.D.   On: 02/18/2021 20:26

## 2021-02-21 NOTE — TOC Initial Note (Signed)
Transition of Care [facility]) - Initial/Assessment Note    Patient Details  Name: [name] Michael MRN: [id number] Date of Birth: 17-Feb-1961  [facility]) CM/SW Contact:    Angelita Ingles, RN Phone [phone]  02/21/2021, 10:21 AM  Clinical Narrative:                 TOC consulted for patient from [place] with plans to return to [facility] CM at bedside with patient and patient states that she is ok with return to [facility] and that her needs are being met at the facility. Patient is agreeable to sister  as the contact person. There are no other needs identified at this time. TOC will continue to follow.   Expected Discharge Plan: Skilled Nursing Facility Barriers to Discharge: Continued Medical Work up   Patient Goals and CMS Choice   CMS Medicare.gov Compare Post Acute Care list provided to:: Patient Choice offered to / list presented to : Patient  Expected Discharge Plan and Services Expected Discharge Plan: [place] In-house Referral: Clinical Social Work Discharge Planning Services: CM Consult Post Acute Care Choice: [place] Living arrangements for the past 2 months: [place]                 DME Arranged: N/A DME Agency: NA       HH Arranged: NA [place] Agency: NA        Prior Living Arrangements/Services Living arrangements for the past 2 months: [place] Lives with:: Facility Resident Patient language and need for interpreter reviewed:: Yes Do you feel safe going back to the place where you live?: Yes      Need for Family Participation in Patient Care: Yes (Comment) Care giver support system in place?: Yes (comment) Current home services: Other (comment) (non currently at [facility]) Criminal Activity/Legal Involvement Pertinent to Current Situation/Hospitalization: No - Comment as needed  Activities of Daily Living   ADL Screening (condition at time of admission) Patient's  cognitive ability adequate to safely complete daily activities?: Yes Is the patient deaf or have difficulty hearing?: No Does the patient have difficulty seeing, even when wearing glasses/contacts?: No Does the patient have difficulty concentrating, remembering, or making decisions?: No Patient able to express need for assistance with ADLs?: Yes  Permission Sought/Granted                  Emotional Assessment Appearance:: Appears stated age Attitude/Demeanor/Rapport: Gracious Affect (typically observed): Pleasant Orientation: : Oriented to Self, Oriented to Place, Oriented to  Time Alcohol / Substance Use: Never Used Psych Involvement: No (comment)  Admission diagnosis:  Acute on chronic respiratory failure with hypoxia ([place]) [J96.21] AMS (altered mental status) [R41.82] Patient Active Problem List   Diagnosis Date Noted   Acute on chronic respiratory failure with hypoxia ([place]) 02/18/2021   Pressure injury of skin 02/17/2021   Abdominal ascites 01/25/2021   Lymphocytopenia 10/13/2020   Ear hematoma, right, initial encounter 10/13/2020   Prolonged QT interval 10/13/2020   Lactic acidosis 10/05/2020   Transaminitis 10/05/2020   Thrombocytopenia ([place]) 10/05/2020   Acute respiratory failure with hypoxia ([place]) 10/05/2020   Altered mental status 34/19/3790   Acute metabolic encephalopathy 24/02/7352   Severe tricuspid regurgitation 12/04/2019   ESRD (end stage renal disease) ([place])    GERD (gastroesophageal reflux disease)    Hypertension    Type II diabetes mellitus ([place])    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   AMS (altered mental  status) 04/28/2019   Hypoxemia 04/28/2019   Fall 06/09/2018   Fall at home, initial encounter 06/09/2018   Anemia of chronic disease 06/09/2018   Syncope and collapse 06/09/2018   Acute encephalopathy 03/10/2018   Hypermagnesemia 03/10/2018   Acute lower UTI 11/09/2016   Uncontrolled type 2 diabetes mellitus with complication ([place])     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 associated with diabetes mellitus due to underlying condition (HCC)    Chronic diastolic CHF (congestive heart failure) ([place])    Hypokalemia 06/11/2016   Proliferative diabetic retinopathy ([place]) 04/29/2016   Poor social situation 03/09/2013   Depression 03/01/2013   Abnormal mammogram 12/20/2012   Retinal detachment 11/17/2012   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 ([place]) 52/77/8242   Hyperlipidemia 02/09/2007   Essential hypertension 02/09/2007   PCP:  Charlott Rakes, MD Pharmacy:  No Pharmacies Listed    Social Determinants of Health (SDOH) Interventions    Readmission Risk Interventions Readmission Risk Prevention Plan 02/21/2021 01/31/2021 12/07/2019  Transportation Screening Complete Complete Complete  PCP or Specialist Appt within 3-5 Days - - Complete  HRI or [place] - - Complete  Social Work Consult for [place] Planning/Counseling - - Complete  Palliative Care Screening - - Not Applicable  Medication Review (RN Care Manager) Referral to Pharmacy Referral to Pharmacy Referral to Pharmacy  PCP or Specialist appointment within 3-5 days of discharge Complete Complete -  [place] or Home Care Consult Complete Complete -  SW Recovery Care/Counseling Consult Complete Complete -  Palliative Care Screening Not Applicable Not Applicable -  Skilled Nursing Facility Complete Complete -  Some recent data might be hidden

## 2021-02-21 NOTE — Progress Notes (Signed)
[facility] ROUNDING NOTE   Subjective:   Interval History: This is a 60 year old lady hemodialysis dependent history of diabetes hypertension and CVA dialyzes Tuesday Thursday Saturday at [facility] center recent admission 8 /5-8/05/2021.  She had E. coli bacteremia and acute encephalopathy.  She dialyzes on a regular basis.  She is currently a resident of a SNF.  Her last dialysis treatment 02/19/2021 and next dialysis 02/21/2021  Blood pressure 160/70 pulse 78 temperature 98 O2 sats 93% nasal cannula  Sodium 133 potassium 3.7 chloride 92 CO2 28 BUN 47 creatinine 4.79 glucose 86 phosphorus 5.7 albumin 2.6 hemoglobin 10.1   Objective:  Vital signs in last 24 hours:  Temp:  [98 F (36.7 C)-98.7 F (37.1 C)] 98 F (36.7 C) (09/01 0345) Pulse Rate:  [70-82] 75 (09/01 0400) Resp:  [11-20] 11 (09/01 0400) BP: (133-184)/(49-91) 168/75 (09/01 0400) SpO2:  [90 %-99 %] 94 % (09/01 0400)  Weight change:  Filed Weights   02/19/21 1300  Weight: 41.7 kg    Intake/Output: I/O last 3 completed shifts: In: 780 [P.O.:780] Out: -    Intake/Output this shift:  No intake/output data recorded.  General: Small, chronically ill female in NAD Heart: [id number] 2/6 systolic M No R/G no JVD Lungs: CTAB Abdomen: NABS, NT Extremities:No LE edema Dialysis Access: L AVF +T/B bruising at bottom of AVF   Basic Metabolic Panel: Recent Labs  Lab 02/16/21 1439 02/18/21 2017 02/18/21 2035 02/19/21 0159  NA 133* 134* 133* 133*  K 3.1* 3.4* 3.4* 3.7  CL 91* 89*  --  92*  CO2 30 29  --  28  GLUCOSE 128* 124*  --  86  BUN 33* 47*  --  47*  CREATININE 3.31* 4.86*  --  4.79*  CALCIUM 8.8* 9.0  --  8.9  MG  --   --   --  2.3  PHOS  --   --   --  5.7*    Liver Function Tests: Recent Labs  Lab 02/16/21 1439 02/18/21 2017 02/19/21 0159  AST '26 26 25  '$ ALT '14 12 13  '$ ALKPHOS 101 109 111  BILITOT 1.0 0.9 1.1  PROT 6.9 7.0 6.7  ALBUMIN 2.7* 2.8* 2.6*   Recent Labs   Lab 02/16/21 1439  LIPASE 32   Recent Labs  Lab 02/18/21 2017  AMMONIA 31    CBC: Recent Labs  Lab 02/16/21 1439 02/18/21 2017 02/18/21 2035 02/19/21 0330  WBC 7.1 6.3  --  6.5  NEUTROABS 5.9 4.7  --   --   HGB 10.7* 11.2* 13.3 10.1*  HCT 34.6* 37.0 39.0 33.8*  MCV 88.5 91.4  --  90.6  PLT 251 214  --  206    Cardiac Enzymes: No results for input(s): CKTOTAL, CKMB, CKMBINDEX, TROPONINI in the last 168 hours.  BNP: Invalid input(s): POCBNP  CBG: Recent Labs  Lab 02/20/21 0725 02/20/21 1122 02/20/21 1623 02/20/21 1930 02/21/21 0349  GLUCAP 100* 145* 153* 154* 106*    Microbiology: Results for orders placed or performed during the hospital encounter of 02/18/21  Resp Panel by RT-PCR (Flu A&B, Covid) Nasopharyngeal Swab     Status: None   Collection Time: 02/18/21  7:26 PM   Specimen: Nasopharyngeal Swab; Nasopharyngeal(NP) swabs in vial transport medium  Result Value Ref Range Status   SARS Coronavirus 2 by RT PCR NEGATIVE NEGATIVE Final    Comment: (NOTE) SARS-CoV-2 target nucleic acids are NOT DETECTED.  The SARS-CoV-2 RNA is generally detectable in  upper respiratory specimens during the acute phase of infection. The lowest concentration of SARS-CoV-2 viral copies this assay can detect is 138 copies/mL. A negative result does not preclude SARS-Cov-2 infection and should not be used as the sole basis for treatment or other patient management decisions. A negative result may occur with  improper specimen collection/handling, submission of specimen other than nasopharyngeal swab, presence of viral mutation(s) within the areas targeted by this assay, and inadequate number of viral copies(<138 copies/mL). A negative result must be combined with clinical observations, patient history, and epidemiological information. The expected result is Negative.  Fact Sheet for Patients:  EntrepreneurPulse.com.au  Fact Sheet for Healthcare Providers:   IncredibleEmployment.be  This test is no t yet approved or cleared by the [place] FDA and  has been authorized for detection and/or diagnosis of SARS-CoV-2 by FDA under an Emergency Use Authorization (EUA). This EUA will remain  in effect (meaning this test can be used) for the duration of the COVID-19 declaration under Section 564(b)(1) of the Act, 21 U.S.C.section 360bbb-3(b)(1), unless the authorization is terminated  or revoked sooner.       Influenza A by PCR NEGATIVE NEGATIVE Final   Influenza B by PCR NEGATIVE NEGATIVE Final    Comment: (NOTE) The Xpert Xpress SARS-CoV-2/FLU/RSV plus assay is intended as an aid in the diagnosis of influenza from Nasopharyngeal swab specimens and should not be used as a sole basis for treatment. Nasal washings and aspirates are unacceptable for Xpert Xpress SARS-CoV-2/FLU/RSV testing.  Fact Sheet for Patients: EntrepreneurPulse.com.au  Fact Sheet for Healthcare Providers: IncredibleEmployment.be  This test is not yet approved or cleared by the [place] FDA and has been authorized for detection and/or diagnosis of SARS-CoV-2 by FDA under an Emergency Use Authorization (EUA). This EUA will remain in effect (meaning this test can be used) for the duration of the COVID-19 declaration under Section 564(b)(1) of the Act, 21 U.S.C. section 360bbb-3(b)(1), unless the authorization is terminated or revoked.  Performed at [facility], [place] [address] [place],  [address]     Coagulation Studies: No results for input(s): LABPROT, INR in the last 72 hours.  Urinalysis: No results for input(s): COLORURINE, LABSPEC, PHURINE, GLUCOSEU, HGBUR, BILIRUBINUR, KETONESUR, PROTEINUR, UROBILINOGEN, NITRITE, LEUKOCYTESUR in the last 72 hours.  Invalid input(s): APPERANCEUR    Imaging: IR Paracentesis  Result Date: 02/20/2021 INDICATION: Patient history of end-stage renal  disease on hemodialysis, congestive heart failure with recurrent ascites. Request to IR for therapeutic paracentesis. EXAM: ULTRASOUND GUIDED THERAPEUTIC PARACENTESIS MEDICATIONS: 10 mL% lidocaine COMPLICATIONS: None immediate. PROCEDURE: Informed written consent was obtained from the patient after a discussion of the risks, benefits and alternatives to treatment. A timeout was performed prior to the initiation of the procedure. Initial ultrasound scanning demonstrates a small amount of ascites within the right upper abdominal quadrant. The right upper abdomen was prepped and draped in the usual sterile fashion. 1% lidocaine was used for local anesthesia. Following this, a 19 gauge, 7-cm, Yueh catheter was introduced. An ultrasound image was saved for documentation purposes. The paracentesis was performed. The catheter was removed and a dressing was applied. The patient tolerated the procedure well without immediate post procedural complication. FINDINGS: A total of approximately 2.2 L of serosanguineous fluid was removed. IMPRESSION: Successful ultrasound-guided paracentesis yielding 2.2 liters of peritoneal fluid. Read by Candiss Norse, PA-C Electronically Signed   By: Ruthann Cancer M.D.   On: 02/20/2021 16:19     Medications:    albumin human  iron sucrose      (feeding supplement) PROSource Plus  30 mL Oral BID BM   amoxicillin-clavulanate  1 tablet Oral QHS   aspirin  81 mg Oral Daily   atorvastatin  40 mg Oral QHS   carvedilol  6.25 mg Oral BID WC   Chlorhexidine Gluconate Cloth  6 each Topical Q0600   darbepoetin (ARANESP) injection - DIALYSIS  25 mcg Intravenous Q Thu-HD   doxercalciferol  5 mcg Intravenous Q T,Th,Sa-HD   FLUoxetine  20 mg Oral Daily   heparin  5,000 Units Subcutaneous Q8H   levothyroxine  75 mcg Oral Q0600   midodrine  10 mg Oral Q T,Th,Sa-HD   multivitamin  1 tablet Oral QHS   pantoprazole  40 mg Oral Daily   sodium chloride flush  3 mL Intravenous Q12H    acetaminophen **OR** acetaminophen, albumin human, albuterol, cloNIDine, hydrALAZINE, lidocaine, loperamide, polyethylene glycol  Assessment/ Plan:  1. Acute on chronic respiratory failure: Increase O2 demands in setting of missed HD. CTA negative for PE. Probably related to volume overload in setting of missed HD. HD 02/19/2021 Net UF 4.5 L.  2. AMS-appears at baseline. Per primary 2. ESRD -T,TH,S. Next HD 02/21/2021 3. Anemia - HGB 10.1. ESA past due.  Administered ASA 4. Secondary hyperparathyroidism -  5. HTN/volume - HD 02/20/2019. Net UF 4.5 L. Still above OP EDW. Continue lowering volume as tolerated. BP variable since admit. Increase carvedilol to 6.25 mg PO BID 6. Nutrition - Albumin low. Soft diet with fluid restrictions. Protein supps.  7. UTI-40,000 colonies Enterococcus Faecalis in urine. Continues on Augmentin per primary    LOS: 2 Sherril Croon '@TODAY''@7'$ :45 AM

## 2021-02-21 NOTE — Plan of Care (Signed)

## 2021-02-21 NOTE — TOC Progression Note (Signed)
Transition of Care [facility]) - Progression Note    Patient Details  Name: [name] Michael MRN: [id number] Date of Birth: 08-27-1960  Transition of [facility]) CM/SW [place], RN Phone [phone]  02/21/2021, 9:25 AM  Clinical Narrative:    Per MD patient will be ready for dc tomorrow. CM has spoken with Rolla Plate at [facility]. Patient is ok to return FL2 has been updated and patient will need new covid test. Message sent to MD to request covid test. TOC will continue to follow.        Expected Discharge Plan and Services                                                 Social Determinants of Health (SDOH) Interventions    Readmission Risk Interventions Readmission Risk Prevention Plan 01/31/2021 12/07/2019 04/29/2019  Transportation Screening Complete Complete Complete  PCP or Specialist Appt within 3-5 Days - Complete Complete  HRI or [place] - Complete Complete  Social Work Consult for [place] Planning/Counseling - Complete Complete  Palliative Care Screening - Not Applicable Not Applicable  Medication Review Press photographer) Referral to Pharmacy Referral to Pharmacy Referral to Pharmacy  PCP or Specialist appointment within 3-5 days of discharge Complete - -  [place] or Home Care Consult Complete - -  SW Recovery Care/Counseling Consult Complete - -  Palliative Care Screening Not Applicable - -  Skilled Nursing Facility Complete - -  Some recent data might be hidden

## 2021-02-21 NOTE — Progress Notes (Signed)
Pt. Left to dialysis.

## 2021-02-22 LAB — GLUCOSE, CAPILLARY
Glucose-Capillary: 101 mg/dL — ABNORMAL HIGH (ref 70–99)
Glucose-Capillary: 100 mg/dL — ABNORMAL HIGH (ref 70–99)
Glucose-Capillary: 112 mg/dL — ABNORMAL HIGH (ref 70–99)
Glucose-Capillary: 155 mg/dL — ABNORMAL HIGH (ref 70–99)
Glucose-Capillary: 146 mg/dL — ABNORMAL HIGH (ref 70–99)

## 2021-02-22 MED ORDER — DARBEPOETIN ALFA 25 MCG/0.42ML IJ SOSY: 25.0000 ug | PREFILLED_SYRINGE | INTRAMUSCULAR | Status: DC

## 2021-02-22 MED ORDER — CARVEDILOL 6.25 MG PO TABS: 6.2500 mg | ORAL_TABLET | Freq: Two times a day (BID) | ORAL | Status: DC

## 2021-02-22 MED ORDER — CHLORHEXIDINE GLUCONATE CLOTH 2 % EX PADS
6.0000 | MEDICATED_PAD | Freq: Every day | CUTANEOUS | Status: DC
  Administered 2021-02-22: 6 via TOPICAL

## 2021-02-22 NOTE — Discharge Summary (Signed)
[name] Michael [id number] DOB: 03-20-1961 DOA: 02/18/2021  PCP: Charlott Rakes, MD  Admit date: 02/18/2021  Discharge date: 02/22/2021  Admitted From: SNF Disposition:  SNF   Recommendations for Outpatient Follow-up:   Follow up with PCP in 1-2 weeks  PCP Please obtain BMP/CBC, 2 view CXR in 1week,  (see Discharge instructions)   PCP Please follow up on the following pending results:   Home Health: None   Equipment/Devices: None  Consultations: None Renal Discharge Condition: Guarded CODE STATUS: DNR Diet Recommendation: Renal diet with 1.2 L fluid restriction per day      Chief Complaint  Patient presents with   Fatigue     Brief history of present illness from the day of admission and additional interim summary    [name] Michael is a 60 y.o. female with medical history significant of ESRD on HD TTS, CVA, hyperlipidemia, hypertension, diabetes, CHF, anemia, hypothyroidism, depression, GERD, prolonged QT, syncope, cytopenia who presents from her facility with hypoxia and altered mental status after she could not undergo her full HD session 2 days ago outpt due to > discomfort.                                                                  Hospital Course    Acute toxic encephalopathy and acute on chronic hypoxic respiratory failure due to fluid overload from missed HD session - she only did 30 minutes of outpatient dialysis on last Saturday that is 2 days prior to hospital admission and had signs of uremia along with fluid overload causing her symptoms, nephrology was consulted she underwent urgent dialysis for fluid removal thereafter she considerably improved, now on her routine TTS schedule and back to baseline.  Of note she has very poor baseline and is DNR, she will now be discharged back to SNF note  she uses 2 to 3 L oxygen at baseline.  If there is significant decline please consider hospice follow-up and management.   2.  ESRD with chronic mild to moderate abdominal ascites - nephrology on board on TTS schedule.      3.  Acute on chronic diastolic CHF last EF [id number] on recent echocardiogram.  Fluid removal through HD and ascites fluid removal through paracentesis   4.  Anemia of chronic disease.  Stable.   5.  Chronic thrombocytopenia.  Stable monitor.   6.  Hypertension.  Blood pressure elevated and have increased her Coreg dose, she also gets as needed clonidine at SNF which will be continued.  I question if she requires midodrine on the days of dialysis at this point.   7.  History of CVA.  On aspirin and statin for secondary prevention.   8.  Dyslipidemia.  Continue statin.  .   9.  Hypothyroidism with stable TSH  currently on Synthroid.   10. Severe protein calorie malnutrition with massive third spacing of fluid.  Supportive care protein supplements.     11. Recent outpatient diagnosis of UTI -  on Augmentin finish the course which was started in SNF should be close to day 7 right now, will request SNF MD to take a look and stop the medication when 7 days of treatment has been achieved.      12. GERD - PPI.   13. Ascites  - likely due to #10, SBP was ruled out recently, is s/p ultrasound-guided paracentesis with 2.2 L of fluid removal on 02/20/2021, much improved abdominal distention and feels much better in terms of her breathing.   14. DM type II.  Continue SNF regimen.  Seems to be controlled.   Discharge diagnosis     Principal Problem:   Acute on chronic respiratory failure with hypoxia (HCC) Active Problems:   Essential hypertension   Uncontrolled type 2 diabetes mellitus with complication (HCC)   Anemia of chronic disease   ESRD (end stage renal disease) (HCC)   GERD (gastroesophageal reflux disease)    Discharge instructions    Discharge Instructions      Discharge instructions   Complete by: As directed    Follow with Primary MD Charlott Rakes, MD in 7 days   Get CBC, CMP, 2 view Chest X ray -  checked next visit within 1 week by Primary MD or SNF MD    Activity: As tolerated with Full fall precautions use walker/cane & assistance as needed  Disposition SNF  Diet: Renal, 1.2 lit/day total fluid restriction.  Special Instructions: If you have smoked or chewed Tobacco  in the last 2 yrs please stop smoking, stop any regular Alcohol  and or any Recreational drug use.  On your next visit with your primary care physician please Get Medicines reviewed and adjusted.  Please request your Prim.MD to go over all Hospital Tests and Procedure/Radiological results at the follow up, please get all Hospital records sent to your Prim MD by signing hospital release before you go home.  If you experience worsening of your admission symptoms, develop shortness of breath, life threatening emergency, suicidal or homicidal thoughts you must seek medical attention immediately by calling 911 or calling your MD immediately  if symptoms less severe.  You Must read complete instructions/literature along with all the possible adverse reactions/side effects for all the Medicines you take and that have been prescribed to you. Take any new Medicines after you have completely understood and accpet all the possible adverse reactions/side effects.   Discharge wound care:   Complete by: As directed    Pressure Injury 02/16/21 Sacrum Circumferential Stage 2 -  Partial thickness loss of dermis presenting as a shallow open injury with a red, pink wound bed without slough - Wet to dry daily dressing - SNF wound care   Increase activity slowly   Complete by: As directed        Discharge Medications   Allergies as of 02/22/2021       Reactions   Hydrocodone Nausea And Vomiting, Other (See Comments)   "Allergic," per [facility]        Medication List     STOP taking these  medications    fosfomycin 3 g Pack Commonly known as: MONUROL       TAKE these medications    amoxicillin-clavulanate 875-125 MG tablet Commonly known as: AUGMENTIN Take 1 tablet by mouth at bedtime for 7  days.   aspirin 81 MG chewable tablet Chew 81 mg by mouth daily.   atorvastatin 40 MG tablet Commonly known as: LIPITOR Take 40 mg by mouth at bedtime.   b complex-vitamin c-folic acid 0.8 MG Tabs tablet Take 1 tablet by mouth at bedtime.   carvedilol 6.25 MG tablet Commonly known as: COREG Take 1 tablet (6.25 mg total) by mouth 2 (two) times daily with a meal. What changed:  medication strength how much to take   cloNIDine 0.1 MG tablet Commonly known as: CATAPRES Take 0.1 mg by mouth every 8 (eight) hours as needed (BP>160/90).   feeding supplement (PRO-STAT SUGAR FREE 64) Liqd Take 30 mLs by mouth in the morning. WILD CHERRY FLAVOR   FLUoxetine 20 MG capsule Commonly known as: PROZAC Take 20 mg by mouth daily.   levothyroxine 75 MCG tablet Commonly known as: SYNTHROID Take 1 tablet (75 mcg total) by mouth daily at 6 (six) AM.   midodrine 10 MG tablet Commonly known as: PROAMATINE Take 10 mg by mouth See admin instructions. Take 1 tablet (10 mg) by mouth prior to dialysis on Tuesday, Thursday, Saturday   [employer] Renal Liqd Take 237 mLs by mouth with breakfast, with lunch, and with evening meal.   omega-3 acid ethyl esters 1 g capsule Commonly known as: LOVAZA Take 2 capsules by mouth 2 (two) times daily.   omeprazole 40 MG capsule Commonly known as: PRILOSEC Take 40 mg by mouth daily before breakfast.   ondansetron 4 MG tablet Commonly known as: ZOFRAN Take 4 mg by mouth every 8 (eight) hours as needed for nausea or vomiting.   polyethylene glycol powder 17 GM/SCOOP powder Commonly known as: GLYCOLAX/MIRALAX Take 17 g by mouth See admin instructions. Mix 17 grams of powder into 8 ounces of fluid, stir, and drink (by mouth) once a day    PREPARATION H RAPID RELIEF EX Apply topically every 6 (six) hours as needed (hemorrhoid).   SACCHAROMYCES BOULARDII PO Take 1 capsule by mouth 2 (two) times daily.   senna-docusate 8.6-50 MG tablet Commonly known as: Senokot-S Take 1 tablet by mouth at bedtime as needed for mild constipation.   sodium bicarbonate 650 MG tablet Take 1,300 mg by mouth 2 (two) times daily.               Discharge Care Instructions  (From admission, onward)           Start     Ordered   02/22/21 0000  Discharge wound care:       Comments: Pressure Injury 02/16/21 Sacrum Circumferential Stage 2 -  Partial thickness loss of dermis presenting as a shallow open injury with a red, pink wound bed without slough - Wet to dry daily dressing - SNF wound care   02/22/21 0931             Follow-up Information     Charlott Rakes, MD. Schedule an appointment as soon as possible for a visit in 1 week(s).   Specialty: Family Medicine Contact information: [place] [address] [phone]         Fay Records, MD .   Specialty: Cardiology Contact information: [address] [place] [address] [phone]                 Major procedures and Radiology Reports - PLEASE review detailed and final reports thoroughly  -       CT Abdomen Pelvis Wo Contrast  Result Date: 01/25/2021 CLINICAL DATA:  Abdominal distension EXAM: CT CHEST, ABDOMEN AND PELVIS WITHOUT CONTRAST TECHNIQUE: Multidetector CT imaging of the chest, abdomen and pelvis was performed following the standard protocol without IV contrast. COMPARISON:  Pelvis radiograph, 10/13/2020. CT abdomen pelvis, 10/05/2020. FINDINGS: The examination is limited secondary to lack of intravenous contrast, and significant motion artifact-most pronounced at the level the chest. CT CHEST FINDINGS Cardiovascular: Cardiomegaly with 4-chamber cardiac enlargement. Multi vessel coronary vascular  calcifications. Mediastinum/Nodes: No enlarged mediastinal, hilar, or axillary lymph nodes. Thyroid gland, trachea, and esophagus demonstrate no significant findings. Lungs/Pleura: Small volume right pleural effusion, with adjacent dependent atelectasis. Musculoskeletal: Healing left fifth anterior rib fracture CT ABDOMEN PELVIS FINDINGS Hepatobiliary: No focal liver abnormality is seen. No gallstones, gallbladder wall thickening, or biliary dilatation. Pancreas: Atrophic.  No pancreatic duct dilation. Spleen: Normal in size without focal abnormality. Adrenals/Urinary Tract: Adrenal glands are unremarkable. Perirenal stranding. Punctate nonobstructing nephroliths within the left renal collecting system. No hydronephrosis. Bladder is unremarkable. Stomach/Bowel: Stomach is within normal limits. Appendix appears normal. No evidence of bowel wall thickening, distention, or inflammatory changes. Vascular/Lymphatic: Aortic atherosclerosis. No aneurysmal dilation. No enlarged abdominal or pelvic lymph nodes. Reproductive: Uterus and bilateral adnexa are unremarkable. Other: Moderate volume of intra-abdominal ascites, as seen along the dependent pelvis, pericolic gutters, perihepatic and perisplenic spaces. Musculoskeletal: Diffuse subcutaneous edema. No acute osseous abnormality. IMPRESSION: Suboptimal evaluation, within this constraint; 1. No acute thoracic or abdominopelvic findings. 2. Cardiomegaly. 3. Small volume right pleural effusion with adjacent basilar consolidation, likely atelectasis though early pneumonia can appear similar. 4. Nonobstructing left nephrolithiasis.  No hydronephrosis. 5. Moderate volume intra-abdominal ascites. 6. Addition and senescent findings, as above. Aortic Atherosclerosis (ICD10-I70.0). Electronically Signed   By: Michaelle Birks MD   On: 01/25/2021 12:11   CT HEAD WO CONTRAST  Result Date: 02/18/2021 CLINICAL DATA:  Encephalopathy EXAM: CT HEAD WITHOUT CONTRAST TECHNIQUE: Contiguous  axial images were obtained from the base of the skull through the vertex without intravenous contrast. COMPARISON:  None. FINDINGS: Brain: There is no mass, hemorrhage or extra-axial collection. There is generalized atrophy without lobar predilection. Hypodensity of the white matter is most commonly associated with chronic microvascular disease. There are old small vessel infarcts of the basal ganglia and thalamus. Vascular: No abnormal hyperdensity of the major intracranial arteries or dural venous sinuses. No intracranial atherosclerosis. Skull: The visualized skull base, calvarium and extracranial soft tissues are normal. Sinuses/Orbits: No fluid levels or advanced mucosal thickening of the visualized paranasal sinuses. No mastoid or middle ear effusion. The orbits are normal. IMPRESSION: 1. No acute intracranial abnormality. 2. Atrophy and chronic microvascular ischemia. 3. Old small vessel infarcts of the basal ganglia and thalamus. Electronically Signed   By: Ulyses Jarred M.D.   On: 02/18/2021 20:57   CT HEAD WO CONTRAST (5MM)  Result Date: 02/12/2021 CLINICAL DATA:  Head trauma, abnormal mental status EXAM: CT HEAD WITHOUT CONTRAST TECHNIQUE: Contiguous axial images were obtained from the base of the skull through the vertex without intravenous contrast. COMPARISON:  April 2022 FINDINGS: Brain: There is no acute intracranial hemorrhage, mass effect, or edema. Gray-white differentiation is preserved. There is no extra-axial fluid collection. Prominence of the ventricles and sulci reflects generalized parenchymal volume loss. Patchy and confluent areas of low-attenuation in the supratentorial white matter are nonspecific but probably reflect similar moderate chronic microvascular ischemic changes. Chronic small vessel infarcts of the central gray nuclei. Vascular: There is atherosclerotic calcification at the skull base. Skull: Calvarium is unremarkable. Sinuses/Orbits: No  acute finding. Other: None.  IMPRESSION: No evidence of acute intracranial injury. Electronically Signed   By: Macy Mis M.D.   On: 02/12/2021 14:42   CT Head Wo Contrast  Result Date: 01/25/2021 CLINICAL DATA:  Mental status change, unknown cause EXAM: CT HEAD WITHOUT CONTRAST TECHNIQUE: Contiguous axial images were obtained from the base of the skull through the vertex without intravenous contrast. COMPARISON:  CT October 13, 2020. FINDINGS: Motion limited study.  Within this limitation: Brain: No evidence of acute large vascular territory infarction, hemorrhage, hydrocephalus, extra-axial collection or mass lesion/mass effect. Similar remote lacunar infarcts in the bilateral basal ganglia and thalami. Similar additional patchy white matter hypoattenuation, likely related to chronic microvascular ischemic disease. Vascular: No hyperdense vessel identified. Skull: No acute fracture. Sinuses/Orbits: Visualized sinuses are clear. Similar exophthalmos without acute orbital finding. Other: No sizable mastoid effusions. IMPRESSION: 1. No evidence of acute intracranial abnormality on this motion study. 2. Similar remote lacunar infarcts in bilateral basal ganglia and thalami and chronic microvascular ischemic disease. Electronically Signed   By: Margaretha Sheffield MD   On: 01/25/2021 14:20   CT Chest Wo Contrast  Result Date: 01/25/2021 CLINICAL DATA:  Abdominal distension EXAM: CT CHEST, ABDOMEN AND PELVIS WITHOUT CONTRAST TECHNIQUE: Multidetector CT imaging of the chest, abdomen and pelvis was performed following the standard protocol without IV contrast. COMPARISON:  Pelvis radiograph, 10/13/2020. CT abdomen pelvis, 10/05/2020. FINDINGS: The examination is limited secondary to lack of intravenous contrast, and significant motion artifact-most pronounced at the level the chest. CT CHEST FINDINGS Cardiovascular: Cardiomegaly with 4-chamber cardiac enlargement. Multi vessel coronary vascular calcifications. Mediastinum/Nodes: No enlarged  mediastinal, hilar, or axillary lymph nodes. Thyroid gland, trachea, and esophagus demonstrate no significant findings. Lungs/Pleura: Small volume right pleural effusion, with adjacent dependent atelectasis. Musculoskeletal: Healing left fifth anterior rib fracture CT ABDOMEN PELVIS FINDINGS Hepatobiliary: No focal liver abnormality is seen. No gallstones, gallbladder wall thickening, or biliary dilatation. Pancreas: Atrophic.  No pancreatic duct dilation. Spleen: Normal in size without focal abnormality. Adrenals/Urinary Tract: Adrenal glands are unremarkable. Perirenal stranding. Punctate nonobstructing nephroliths within the left renal collecting system. No hydronephrosis. Bladder is unremarkable. Stomach/Bowel: Stomach is within normal limits. Appendix appears normal. No evidence of bowel wall thickening, distention, or inflammatory changes. Vascular/Lymphatic: Aortic atherosclerosis. No aneurysmal dilation. No enlarged abdominal or pelvic lymph nodes. Reproductive: Uterus and bilateral adnexa are unremarkable. Other: Moderate volume of intra-abdominal ascites, as seen along the dependent pelvis, pericolic gutters, perihepatic and perisplenic spaces. Musculoskeletal: Diffuse subcutaneous edema. No acute osseous abnormality. IMPRESSION: Suboptimal evaluation, within this constraint; 1. No acute thoracic or abdominopelvic findings. 2. Cardiomegaly. 3. Small volume right pleural effusion with adjacent basilar consolidation, likely atelectasis though early pneumonia can appear similar. 4. Nonobstructing left nephrolithiasis.  No hydronephrosis. 5. Moderate volume intra-abdominal ascites. 6. Addition and senescent findings, as above. Aortic Atherosclerosis (ICD10-I70.0). Electronically Signed   By: Michaelle Birks MD   On: 01/25/2021 12:11   CT Angio Chest PE W and/or Wo Contrast  Result Date: 02/18/2021 CLINICAL DATA:  Concern for pulmonary embolism.  Hypoxia. EXAM: CT ANGIOGRAPHY CHEST WITH CONTRAST TECHNIQUE:  Multidetector CT imaging of the chest was performed using the standard protocol during bolus administration of intravenous contrast. Multiplanar CT image reconstructions and MIPs were obtained to evaluate the vascular anatomy. CONTRAST:  43m OMNIPAQUE IOHEXOL 350 MG/ML SOLN COMPARISON:  CT dated 01/25/2021. FINDINGS: Cardiovascular: Moderate cardiomegaly. No pericardial effusion. There is retrograde flow of contrast from the right atrium into the IVC consistent with right heart dysfunction.  Moderate atherosclerotic calcification of the thoracic aorta. No aneurysmal dilatation. Non opacification of a small left upper lobe segmental branch (series 7 images 91-101) likely artifactual. A nonocclusive pulmonary artery embolus within this branch is less likely. No other pulmonary artery embolus identified. Mediastinum/Nodes: No hilar or mediastinal adenopathy. The esophagus is grossly unremarkable. Mediastinal fluid collection. Lungs/Pleura: Small bilateral pleural effusions, right greater than left. There is associated partial compressive atelectasis of the lower lobes. Pneumonia is not excluded clinical correlation is recommended. No pneumothorax. The central airways are patent. Upper Abdomen: Small ascites. Musculoskeletal: Diffuse subcutaneous edema and anasarca. Osteopenia. No acute osseous pathology. Old sternal fracture. Review of the MIP images confirms the above findings. IMPRESSION: 1. No definite CT evidence of pulmonary embolism. 2. Moderate cardiomegaly with evidence of right heart dysfunction. 3. Small bilateral pleural effusions, right greater than left, with associated partial compressive atelectasis of the lower lobes. Pneumonia is not excluded clinical correlation is recommended. 4. Small ascites and anasarca. 5. Aortic Atherosclerosis (ICD10-I70.0). Electronically Signed   By: Anner Crete M.D.   On: 02/18/2021 23:14   CT Cervical Spine Wo Contrast  Result Date: 02/12/2021 CLINICAL DATA:  Neck  trauma, dangerous injury mechanism EXAM: CT CERVICAL SPINE WITHOUT CONTRAST TECHNIQUE: Multidetector CT imaging of the cervical spine was performed without intravenous contrast. Multiplanar CT image reconstructions were also generated. COMPARISON:  None. FINDINGS: Alignment: No significant listhesis. Skull base and vertebrae: Vertebral body heights are maintained. No acute fracture. Soft tissues and spinal canal: No prevertebral fluid or swelling. No visible canal hematoma. Disc levels:  No significant degenerative stenosis. Upper chest: Negative. Other: None. IMPRESSION: No acute cervical spine fracture. Electronically Signed   By: Macy Mis M.D.   On: 02/12/2021 14:54   DG Pelvis Portable  Result Date: 02/12/2021 CLINICAL DATA:  Fall, syncope EXAM: PORTABLE PELVIS 1-2 VIEWS COMPARISON:  None. FINDINGS: There is no evidence of pelvic fracture or diastasis. No pelvic bone lesions are seen. IMPRESSION: No fracture or dislocation Electronically Signed   By: Suzy Bouchard M.D.   On: 02/12/2021 12:47   US Paracentesis  Result Date: 01/26/2021 INDICATION: History of ESRD, ascites seen on previous ultrasound study. Request for therapeutic and diagnostic paracentesis. EXAM: ULTRASOUND GUIDED  PARACENTESIS MEDICATIONS: 10 mL 1% lidocaine COMPLICATIONS: None immediate. PROCEDURE: Informed written consent was obtained from the patient after a discussion of the risks, benefits and alternatives to treatment. A timeout was performed prior to the initiation of the procedure. Initial ultrasound scanning demonstrates a large amount of ascites within the right lower abdominal quadrant. The right lower abdomen was prepped and draped in the usual sterile fashion. 1% lidocaine was used for local anesthesia. Following this, a 19 gauge, 7-cm, Yueh catheter was introduced. An ultrasound image was saved for documentation purposes. The paracentesis was performed. The catheter was removed and a dressing was applied. The  patient tolerated the procedure well without immediate post procedural complication. FINDINGS: A total of approximately 1.1 L of hazy blood-tinged fluid was removed. Samples were sent to the laboratory as requested by the clinical team. IMPRESSION: Successful ultrasound-guided paracentesis yielding 1.1 liters of peritoneal fluid. Read by: Durenda Guthrie, PA-C Electronically Signed   By: Ruthann Cancer MD   On: 01/26/2021 13:52   DG Chest Port 1 View  Result Date: 02/18/2021 CLINICAL DATA:  Lethargy. EXAM: PORTABLE CHEST 1 VIEW COMPARISON:  Chest radiograph dated 02/16/2021. FINDINGS: Faint right lung base density, likely atelectasis. No focal consolidation pleural effusion or pneumothorax. Cardiomegaly. No acute pathology. IMPRESSION:  1. No acute cardiopulmonary process. 2. Cardiomegaly. Electronically Signed   By: Anner Crete M.D.   On: 02/18/2021 20:26   DG Chest Portable 1 View  Result Date: 02/16/2021 CLINICAL DATA:  Patient missed dialysis.  Mild shortness of breath. EXAM: PORTABLE CHEST 1 VIEW COMPARISON:  None. FINDINGS: Stable cardiomegaly. The hila and mediastinum are normal. No pneumothorax. No nodules or masses. No focal infiltrates. IMPRESSION: No active disease. Electronically Signed   By: Dorise Bullion III M.D.   On: 02/16/2021 19:10   DG Chest Portable 1 View  Result Date: 02/12/2021 CLINICAL DATA:  Syncope.  Fall. EXAM: PORTABLE CHEST 1 VIEW COMPARISON:  01/25/2021 FINDINGS: Stable cardiac enlargement. Aortic atherosclerosis. No pleural effusion or interstitial edema. No airspace densities. Stable scar versus scratch set scar like density within the left midlung is unchanged. IMPRESSION: No acute cardiopulmonary abnormalities. Electronically Signed   By: Kerby Moors M.D.   On: 02/12/2021 12:48   DG Chest Port 1 View  Result Date: 01/25/2021 CLINICAL DATA:  Questionable sepsis EXAM: PORTABLE CHEST 1 VIEW COMPARISON:  Three days ago FINDINGS: Chronic cardiomegaly. Low volume chest.  There is no edema, consolidation, effusion, or pneumothorax. Mild atelectasis or scarring in the left mid chest. Extensive artifact from EKG leads. IMPRESSION: No focal pneumonia. Cardiomegaly. Electronically Signed   By: Monte Fantasia M.D.   On: 01/25/2021 09:24   [name] ASCITES (ABDOMEN LIMITED)  Result Date: 01/25/2021 CLINICAL DATA:  Assess for ascites EXAM: LIMITED ABDOMEN ULTRASOUND FOR ASCITES TECHNIQUE: Limited ultrasound survey for ascites was performed in all four abdominal quadrants. COMPARISON:  CT 01/25/2021 FINDINGS: Targeted sonography of the 4 quadrants is performed. Moderate ascites is visualized with largest pocket in the right lower quadrant. IMPRESSION: Moderate ascites Electronically Signed   By: Donavan Foil M.D.   On: 01/25/2021 17:53   IR Paracentesis  Result Date: 02/20/2021 INDICATION: Patient history of end-stage renal disease on hemodialysis, congestive heart failure with recurrent ascites. Request to IR for therapeutic paracentesis. EXAM: ULTRASOUND GUIDED THERAPEUTIC PARACENTESIS MEDICATIONS: 10 mL% lidocaine COMPLICATIONS: None immediate. PROCEDURE: Informed written consent was obtained from the patient after a discussion of the risks, benefits and alternatives to treatment. A timeout was performed prior to the initiation of the procedure. Initial ultrasound scanning demonstrates a small amount of ascites within the right upper abdominal quadrant. The right upper abdomen was prepped and draped in the usual sterile fashion. 1% lidocaine was used for local anesthesia. Following this, a 19 gauge, 7-cm, Yueh catheter was introduced. An ultrasound image was saved for documentation purposes. The paracentesis was performed. The catheter was removed and a dressing was applied. The patient tolerated the procedure well without immediate post procedural complication. FINDINGS: A total of approximately 2.2 L of serosanguineous fluid was removed. IMPRESSION: Successful ultrasound-guided  paracentesis yielding 2.2 liters of peritoneal fluid. Read by Candiss Norse, PA-C Electronically Signed   By: Ruthann Cancer M.D.   On: 02/20/2021 16:19    Micro Results     Recent Results (from the past 240 hour(s))  Resp Panel by RT-PCR (Flu A&B, Covid) Nasopharyngeal Swab     Status: None   Collection Time: 02/16/21  7:12 PM   Specimen: Nasopharyngeal Swab; Nasopharyngeal(NP) swabs in vial transport medium  Result Value Ref Range Status   SARS Coronavirus 2 by RT PCR NEGATIVE NEGATIVE Final    Comment: (NOTE) SARS-CoV-2 target nucleic acids are NOT DETECTED.  The SARS-CoV-2 RNA is generally detectable in upper respiratory specimens during the acute phase of infection.  The lowest concentration of SARS-CoV-2 viral copies this assay can detect is 138 copies/mL. A negative result does not preclude SARS-Cov-2 infection and should not be used as the sole basis for treatment or other patient management decisions. A negative result may occur with  improper specimen collection/handling, submission of specimen other than nasopharyngeal swab, presence of viral mutation(s) within the areas targeted by this assay, and inadequate number of viral copies(<138 copies/mL). A negative result must be combined with clinical observations, patient history, and epidemiological information. The expected result is Negative.  Fact Sheet for Patients:  EntrepreneurPulse.com.au  Fact Sheet for Healthcare Providers:  IncredibleEmployment.be  This test is no t yet approved or cleared by the [place] FDA and  has been authorized for detection and/or diagnosis of SARS-CoV-2 by FDA under an Emergency Use Authorization (EUA). This EUA will remain  in effect (meaning this test can be used) for the duration of the COVID-19 declaration under Section 564(b)(1) of the Act, 21 U.S.C.section 360bbb-3(b)(1), unless the authorization is terminated  or revoked sooner.        Influenza A by PCR NEGATIVE NEGATIVE Final   Influenza B by PCR NEGATIVE NEGATIVE Final    Comment: (NOTE) The Xpert Xpress SARS-CoV-2/FLU/RSV plus assay is intended as an aid in the diagnosis of influenza from Nasopharyngeal swab specimens and should not be used as a sole basis for treatment. Nasal washings and aspirates are unacceptable for Xpert Xpress SARS-CoV-2/FLU/RSV testing.  Fact Sheet for Patients: EntrepreneurPulse.com.au  Fact Sheet for Healthcare Providers: IncredibleEmployment.be  This test is not yet approved or cleared by the [place] FDA and has been authorized for detection and/or diagnosis of SARS-CoV-2 by FDA under an Emergency Use Authorization (EUA). This EUA will remain in effect (meaning this test can be used) for the duration of the COVID-19 declaration under Section 564(b)(1) of the Act, 21 U.S.C. section 360bbb-3(b)(1), unless the authorization is terminated or revoked.  Performed at [facility], [place] [address], [place], [place] [address]   Blood culture (routine x 2)     Status: None   Collection Time: 02/16/21  7:46 PM   Specimen: BLOOD  Result Value Ref Range Status   Specimen Description BLOOD RIGHT ANTECUBITAL  Final   Special Requests   Final    BOTTLES DRAWN AEROBIC AND ANAEROBIC Blood Culture adequate volume   Culture   Final    NO GROWTH 5 DAYS Performed at [facility], [place] [address] [place],  [address]    Report Status 02/21/2021 FINAL  Final  Urine Culture     Status: Abnormal   Collection Time: 02/17/21  2:03 PM   Specimen: Urine, Clean Catch  Result Value Ref Range Status   Specimen Description URINE, CLEAN CATCH  Final   Special Requests   Final    NONE Performed at [facility], [place] [address], [place], [place] [address]    Culture 40,000 COLONIES/mL ENTEROCOCCUS FAECALIS (A)  Final   Report Status 02/19/2021 FINAL  Final   Organism ID, Bacteria  ENTEROCOCCUS FAECALIS (A)  Final      Susceptibility   Enterococcus faecalis - MIC*    AMPICILLIN <=2 SENSITIVE Sensitive     NITROFURANTOIN <=16 SENSITIVE Sensitive     VANCOMYCIN 1 SENSITIVE Sensitive     * 40,000 COLONIES/mL ENTEROCOCCUS FAECALIS  Resp Panel by RT-PCR (Flu A&B, Covid) Nasopharyngeal Swab     Status: None   Collection Time: 02/18/21  7:26 PM  Specimen: Nasopharyngeal Swab; Nasopharyngeal(NP) swabs in vial transport medium  Result Value Ref Range Status   SARS Coronavirus 2 by RT PCR NEGATIVE NEGATIVE Final    Comment: (NOTE) SARS-CoV-2 target nucleic acids are NOT DETECTED.  The SARS-CoV-2 RNA is generally detectable in upper respiratory specimens during the acute phase of infection. The lowest concentration of SARS-CoV-2 viral copies this assay can detect is 138 copies/mL. A negative result does not preclude SARS-Cov-2 infection and should not be used as the sole basis for treatment or other patient management decisions. A negative result may occur with  improper specimen collection/handling, submission of specimen other than nasopharyngeal swab, presence of viral mutation(s) within the areas targeted by this assay, and inadequate number of viral copies(<138 copies/mL). A negative result must be combined with clinical observations, patient history, and epidemiological information. The expected result is Negative.  Fact Sheet for Patients:  EntrepreneurPulse.com.au  Fact Sheet for Healthcare Providers:  IncredibleEmployment.be  This test is no t yet approved or cleared by the [place] FDA and  has been authorized for detection and/or diagnosis of SARS-CoV-2 by FDA under an Emergency Use Authorization (EUA). This EUA will remain  in effect (meaning this test can be used) for the duration of the COVID-19 declaration under Section 564(b)(1) of the Act, 21 U.S.C.section 360bbb-3(b)(1), unless the authorization is  terminated  or revoked sooner.       Influenza A by PCR NEGATIVE NEGATIVE Final   Influenza B by PCR NEGATIVE NEGATIVE Final    Comment: (NOTE) The Xpert Xpress SARS-CoV-2/FLU/RSV plus assay is intended as an aid in the diagnosis of influenza from Nasopharyngeal swab specimens and should not be used as a sole basis for treatment. Nasal washings and aspirates are unacceptable for Xpert Xpress SARS-CoV-2/FLU/RSV testing.  Fact Sheet for Patients: EntrepreneurPulse.com.au  Fact Sheet for Healthcare Providers: IncredibleEmployment.be  This test is not yet approved or cleared by the [place] FDA and has been authorized for detection and/or diagnosis of SARS-CoV-2 by FDA under an Emergency Use Authorization (EUA). This EUA will remain in effect (meaning this test can be used) for the duration of the COVID-19 declaration under Section 564(b)(1) of the Act, 21 U.S.C. section 360bbb-3(b)(1), unless the authorization is terminated or revoked.  Performed at [facility], [place] [address], [place], [place] [address]   SARS CORONAVIRUS 2 (TAT 6-24 HRS) Nasopharyngeal Nasopharyngeal Swab     Status: None   Collection Time: 02/21/21  9:55 AM   Specimen: Nasopharyngeal Swab  Result Value Ref Range Status   SARS Coronavirus 2 NEGATIVE NEGATIVE Final    Comment: (NOTE) SARS-CoV-2 target nucleic acids are NOT DETECTED.  The SARS-CoV-2 RNA is generally detectable in upper and lower respiratory specimens during the acute phase of infection. Negative results do not preclude SARS-CoV-2 infection, do not rule out co-infections with other pathogens, and should not be used as the sole basis for treatment or other patient management decisions. Negative results must be combined with clinical observations, patient history, and epidemiological information. The expected result is Negative.  Fact Sheet for  Patients: SugarRoll.be  Fact Sheet for Healthcare Providers: https://www.woods-mathews.com/  This test is not yet approved or cleared by the [place] FDA and  has been authorized for detection and/or diagnosis of SARS-CoV-2 by FDA under an Emergency Use Authorization (EUA). This EUA will remain  in effect (meaning this test can be used) for the duration of the COVID-19 declaration under Se ction 564(b)(1) of the Act, 21 U.S.C. section  360bbb-3(b)(1), unless the authorization is terminated or revoked sooner.  Performed at [facility], [place] [address], [place], [place] [address]     Today   Subjective    Carliyah Skeel today has no headache,no chest abdominal pain,no new weakness tingling or numbness, feels much better.   Objective   Blood pressure (!) 163/51, pulse 74, temperature 98.2 F (36.8 C), temperature source Oral, resp. rate 13, weight 35.9 kg, last menstrual period 03/06/2013, SpO2 94 %.   Intake/Output Summary (Last 24 hours) at 02/22/2021 0932 Last data filed at 02/21/2021 2341 Gross per 24 hour  Intake 351.81 ml  Output 2012 ml  Net -1660.19 ml    Exam  Awake Alert, No new F.N deficits, Normal affect [place].AT,PERRAL Supple Neck,No JVD, No cervical lymphadenopathy appriciated.  Symmetrical Chest wall movement, Good air movement bilaterally, CTAB RRR,No Gallops,Rubs or new Murmurs, No Parasternal Heave +ve B.Sounds, Abd Soft, Non tender, No organomegaly appriciated, No rebound -guarding or rigidity. No Cyanosis, Clubbing or edema, No new Rash or bruise   Data Review   CBC w Diff:  Lab Results  Component Value Date   WBC 6.5 02/19/2021   HGB 10.1 (L) 02/19/2021   HCT 33.8 (L) 02/19/2021   PLT 206 02/19/2021   LYMPHOPCT 12 02/18/2021   MONOPCT 10 02/18/2021   EOSPCT 2 02/18/2021   BASOPCT 1 02/18/2021    CMP:  Lab Results  Component Value Date   NA 133 (L) 02/19/2021   K 3.7 02/19/2021   CL 92 (L)  02/19/2021   CO2 28 02/19/2021   BUN 47 (H) 02/19/2021   CREATININE 4.79 (H) 02/19/2021   CREATININE 0.56 12/20/2012   PROT 6.7 02/19/2021   ALBUMIN 2.6 (L) 02/19/2021   BILITOT 1.1 02/19/2021   ALKPHOS 111 02/19/2021   AST 25 02/19/2021   ALT 13 02/19/2021  .   Total Time in preparing paper work, data evaluation and todays exam - 70 minutes  Lala Lund M.D on 02/22/2021 at 9:32 AM  [facility]

## 2021-02-22 NOTE — Progress Notes (Signed)
[facility] ROUNDING NOTE   Subjective:   Interval History: This is a 60 year old lady hemodialysis dependent history of diabetes hypertension and CVA dialyzes Tuesday Thursday Saturday at [facility] recent admission 8 /5-8/05/2021.  She had E. coli bacteremia and acute encephalopathy.  She dialyzes on a regular basis.  She is currently a resident of a SNF.  Her last dialysis treatment 02/21/2021 with 2 L removed and next dialysis be 02/23/2021  Blood pressure 161/51 pulse 75 temperature 98.2 O2 sats 90%  Sodium 133 potassium 3.7 chloride 92 CO2 28 BUN 47 creatinine 4.79 glucose 86 phosphorus 5.7 albumin 2.6 hemoglobin 10.1   Objective:  Vital signs in last 24 hours:  Temp:  [97.6 F (36.4 C)-98.2 F (36.8 C)] 97.9 F (36.6 C) (09/02 0408) Pulse Rate:  [62-73] 70 (09/02 0408) Resp:  [11-21] 13 (09/02 0408) BP: (131-178)/(50-76) 151/67 (09/02 0408) SpO2:  [93 %-100 %] 95 % (09/02 0408) Weight:  [35.9 kg] 35.9 kg (09/02 0500)  Weight change:  Filed Weights   02/19/21 1300 02/22/21 0500  Weight: 41.7 kg 35.9 kg    Intake/Output: I/O last 3 completed shifts: In: 591.8 [P.O.:480; IV Piggyback:111.8] Out: 2012 [Other:2012]   Intake/Output this shift:  No intake/output data recorded.  General: Small, chronically ill female in NAD Heart: [id number] 2/6 systolic M No R/G no JVD Lungs: CTAB Abdomen: NABS, NT Extremities:No LE edema Dialysis Access: L AVF +T/B bruising at bottom of AVF   Basic Metabolic Panel: Recent Labs  Lab 02/16/21 1439 02/18/21 2017 02/18/21 2035 02/19/21 0159  NA 133* 134* 133* 133*  K 3.1* 3.4* 3.4* 3.7  CL 91* 89*  --  92*  CO2 30 29  --  28  GLUCOSE 128* 124*  --  86  BUN 33* 47*  --  47*  CREATININE 3.31* 4.86*  --  4.79*  CALCIUM 8.8* 9.0  --  8.9  MG  --   --   --  2.3  PHOS  --   --   --  5.7*     Liver Function Tests: Recent Labs  Lab 02/16/21 1439 02/18/21 2017 02/19/21 0159  AST '26 26 25  '$ ALT '14  12 13  '$ ALKPHOS 101 109 111  BILITOT 1.0 0.9 1.1  PROT 6.9 7.0 6.7  ALBUMIN 2.7* 2.8* 2.6*    Recent Labs  Lab 02/16/21 1439  LIPASE 32    Recent Labs  Lab 02/18/21 2017  AMMONIA 31     CBC: Recent Labs  Lab 02/16/21 1439 02/18/21 2017 02/18/21 2035 02/19/21 0330  WBC 7.1 6.3  --  6.5  NEUTROABS 5.9 4.7  --   --   HGB 10.7* 11.2* 13.3 10.1*  HCT 34.6* 37.0 39.0 33.8*  MCV 88.5 91.4  --  90.6  PLT 251 214  --  206     Cardiac Enzymes: No results for input(s): CKTOTAL, CKMB, CKMBINDEX, TROPONINI in the last 168 hours.  BNP: Invalid input(s): POCBNP  CBG: Recent Labs  Lab 02/21/21 0802 02/21/21 1158 02/21/21 1600 02/22/21 0104 02/22/21 0749  GLUCAP 110* 158* 208* 101* 100*     Microbiology: Results for orders placed or performed during the hospital encounter of 02/18/21  Resp Panel by RT-PCR (Flu A&B, Covid) Nasopharyngeal Swab     Status: None   Collection Time: 02/18/21  7:26 PM   Specimen: Nasopharyngeal Swab; Nasopharyngeal(NP) swabs in vial transport medium  Result Value Ref Range Status   SARS Coronavirus 2 by RT PCR  NEGATIVE NEGATIVE Final    Comment: (NOTE) SARS-CoV-2 target nucleic acids are NOT DETECTED.  The SARS-CoV-2 RNA is generally detectable in upper respiratory specimens during the acute phase of infection. The lowest concentration of SARS-CoV-2 viral copies this assay can detect is 138 copies/mL. A negative result does not preclude SARS-Cov-2 infection and should not be used as the sole basis for treatment or other patient management decisions. A negative result may occur with  improper specimen collection/handling, submission of specimen other than nasopharyngeal swab, presence of viral mutation(s) within the areas targeted by this assay, and inadequate number of viral copies(<138 copies/mL). A negative result must be combined with clinical observations, patient history, and epidemiological information. The expected result is  Negative.  Fact Sheet for Patients:  EntrepreneurPulse.com.au  Fact Sheet for Healthcare Providers:  IncredibleEmployment.be  This test is no t yet approved or cleared by the [place] FDA and  has been authorized for detection and/or diagnosis of SARS-CoV-2 by FDA under an Emergency Use Authorization (EUA). This EUA will remain  in effect (meaning this test can be used) for the duration of the COVID-19 declaration under Section 564(b)(1) of the Act, 21 U.S.C.section 360bbb-3(b)(1), unless the authorization is terminated  or revoked sooner.       Influenza A by PCR NEGATIVE NEGATIVE Final   Influenza B by PCR NEGATIVE NEGATIVE Final    Comment: (NOTE) The Xpert Xpress SARS-CoV-2/FLU/RSV plus assay is intended as an aid in the diagnosis of influenza from Nasopharyngeal swab specimens and should not be used as a sole basis for treatment. Nasal washings and aspirates are unacceptable for Xpert Xpress SARS-CoV-2/FLU/RSV testing.  Fact Sheet for Patients: EntrepreneurPulse.com.au  Fact Sheet for Healthcare Providers: IncredibleEmployment.be  This test is not yet approved or cleared by the [place] FDA and has been authorized for detection and/or diagnosis of SARS-CoV-2 by FDA under an Emergency Use Authorization (EUA). This EUA will remain in effect (meaning this test can be used) for the duration of the COVID-19 declaration under Section 564(b)(1) of the Act, 21 U.S.C. section 360bbb-3(b)(1), unless the authorization is terminated or revoked.  Performed at [facility], [place] [address], [place], [place] [address]   SARS CORONAVIRUS 2 (TAT 6-24 HRS) Nasopharyngeal Nasopharyngeal Swab     Status: None   Collection Time: 02/21/21  9:55 AM   Specimen: Nasopharyngeal Swab  Result Value Ref Range Status   SARS Coronavirus 2 NEGATIVE NEGATIVE Final    Comment: (NOTE) SARS-CoV-2 target nucleic  acids are NOT DETECTED.  The SARS-CoV-2 RNA is generally detectable in upper and lower respiratory specimens during the acute phase of infection. Negative results do not preclude SARS-CoV-2 infection, do not rule out co-infections with other pathogens, and should not be used as the sole basis for treatment or other patient management decisions. Negative results must be combined with clinical observations, patient history, and epidemiological information. The expected result is Negative.  Fact Sheet for Patients: SugarRoll.be  Fact Sheet for Healthcare Providers: https://www.woods-mathews.com/  This test is not yet approved or cleared by the [place] FDA and  has been authorized for detection and/or diagnosis of SARS-CoV-2 by FDA under an Emergency Use Authorization (EUA). This EUA will remain  in effect (meaning this test can be used) for the duration of the COVID-19 declaration under Se ction 564(b)(1) of the Act, 21 U.S.C. section 360bbb-3(b)(1), unless the authorization is terminated or revoked sooner.  Performed at [facility], [place] [address], [place], [place] [address]  Coagulation Studies: No results for input(s): LABPROT, INR in the last 72 hours.  Urinalysis: No results for input(s): COLORURINE, LABSPEC, PHURINE, GLUCOSEU, HGBUR, BILIRUBINUR, KETONESUR, PROTEINUR, UROBILINOGEN, NITRITE, LEUKOCYTESUR in the last 72 hours.  Invalid input(s): APPERANCEUR    Imaging: IR Paracentesis  Result Date: 02/20/2021 INDICATION: Patient history of end-stage renal disease on hemodialysis, congestive heart failure with recurrent ascites. Request to IR for therapeutic paracentesis. EXAM: ULTRASOUND GUIDED THERAPEUTIC PARACENTESIS MEDICATIONS: 10 mL% lidocaine COMPLICATIONS: None immediate. PROCEDURE: Informed written consent was obtained from the patient after a discussion of the risks, benefits and alternatives to treatment. A  timeout was performed prior to the initiation of the procedure. Initial ultrasound scanning demonstrates a small amount of ascites within the right upper abdominal quadrant. The right upper abdomen was prepped and draped in the usual sterile fashion. 1% lidocaine was used for local anesthesia. Following this, a 19 gauge, 7-cm, Yueh catheter was introduced. An ultrasound image was saved for documentation purposes. The paracentesis was performed. The catheter was removed and a dressing was applied. The patient tolerated the procedure well without immediate post procedural complication. FINDINGS: A total of approximately 2.2 L of serosanguineous fluid was removed. IMPRESSION: Successful ultrasound-guided paracentesis yielding 2.2 liters of peritoneal fluid. Read by Candiss Norse, PA-C Electronically Signed   By: Ruthann Cancer M.D.   On: 02/20/2021 16:19     Medications:    albumin human     iron sucrose 50 mg (02/21/21 0844)    (feeding supplement) PROSource Plus  30 mL Oral BID BM   amoxicillin-clavulanate  1 tablet Oral QHS   aspirin  81 mg Oral Daily   atorvastatin  40 mg Oral QHS   carvedilol  6.25 mg Oral BID WC   Chlorhexidine Gluconate Cloth  6 each Topical Q0600   darbepoetin (ARANESP) injection - DIALYSIS  25 mcg Intravenous Q Thu-HD   doxercalciferol  5 mcg Intravenous Q T,Th,Sa-HD   FLUoxetine  20 mg Oral Daily   heparin  5,000 Units Subcutaneous Q8H   levothyroxine  75 mcg Oral Q0600   midodrine  10 mg Oral Q T,Th,Sa-HD   multivitamin  1 tablet Oral QHS   pantoprazole  40 mg Oral Daily   sodium chloride flush  3 mL Intravenous Q12H   acetaminophen **OR** acetaminophen, albumin human, albuterol, cloNIDine, hydrALAZINE, lidocaine, loperamide, polyethylene glycol  Assessment/ Plan:  1. Acute on chronic respiratory failure: Increase O2 demands in setting of missed HD. CTA negative for PE. Probably related to volume overload in setting of missed HD.  Next dialysis treatment be  02/23/2021 2. AMS-appears at baseline. Per primary 2. ESRD -T,TH,S.  Next dialysis 02/23/2021 3. Anemia - HGB 10.1. ESA past due.  Administered ASA 4. Secondary hyperparathyroidism -  5. HTN/volume -appears to be controlled with volume removal.  Continue to remove volume with next dialysis treatment 02/23/2021 6. Nutrition - Albumin low. Soft diet with fluid restrictions. Protein supps.  7. UTI-40,000 colonies Enterococcus Faecalis in urine. Continues on Augmentin per primary    LOS: [place] '@TODAY''@8'$ :14 AM

## 2021-02-22 NOTE — Discharge Instructions (Signed)
Follow with Primary MD Charlott Rakes, MD in 7 days   Get CBC, CMP, 2 view Chest X ray -  checked next visit within 1 week by Primary MD or SNF MD    Activity: As tolerated with Full fall precautions use walker/cane & assistance as needed  Disposition SNF  Diet: Renal, 1.2 lit/day total fluid restriction.  Special Instructions: If you have smoked or chewed Tobacco  in the last 2 yrs please stop smoking, stop any regular Alcohol  and or any Recreational drug use.  On your next visit with your primary care physician please Get Medicines reviewed and adjusted.  Please request your Prim.MD to go over all Hospital Tests and Procedure/Radiological results at the follow up, please get all Hospital records sent to your Prim MD by signing hospital release before you go home.  If you experience worsening of your admission symptoms, develop shortness of breath, life threatening emergency, suicidal or homicidal thoughts you must seek medical attention immediately by calling 911 or calling your MD immediately  if symptoms less severe.  You Must read complete instructions/literature along with all the possible adverse reactions/side effects for all the Medicines you take and that have been prescribed to you. Take any new Medicines after you have completely understood and accpet all the possible adverse reactions/side effects.

## 2021-02-22 NOTE — TOC Transition Note (Signed)
Transition of Care [facility]) - CM/SW Discharge Note   Patient Details  Name: [name] Michael MRN: [id number] Date of Birth: [birth year]  Transition of Care [facility]) CM/SW Contact:  Angelita Ingles, RN Phone Number:[phone]  02/22/2021, 12:23 PM   Clinical Narrative:    CM spoke with Rolla Plate at [facility]. Patient is ok to return . Discharge summary has been sent via [place]. Transportation arranged with [facility]. Sister Deloria Lair has been notified and updated on discharge.  Please call report  [phone] Room # 200B   Final next level of care: Skilled Nursing Facility Barriers to Discharge: Continued Medical Work up   Patient Goals and CMS Choice   CMS Medicare.gov Compare Post Acute Care list provided to::  (wants to go back to previous facility no choice needed) Choice offered to / list presented to : Patient  Discharge Placement              Patient chooses bed at: [facility] Patient to be transferred to facility by: [place] Name of family member notified: Deloria Lair Patient and family notified of of transfer: 02/22/21  Discharge Plan and Services In-house Referral: Clinical Social Work Discharge Planning Services: CM Consult Post Acute Care Choice: [place]          DME Arranged: N/A DME Agency: NA       HH Arranged: NA HH Agency: NA        Social Determinants of Health (SDOH) Interventions     Readmission Risk Interventions Readmission Risk Prevention Plan 02/21/2021 01/31/2021 12/07/2019  Transportation Screening Complete Complete Complete  PCP or Specialist Appt within 3-5 Days - - Complete  HRI or Home Care Consult - - Complete  Social Work Consult for [place] Planning/Counseling - - Complete  Palliative Care Screening - - Not Applicable  Medication Review (RN Care Manager) Referral to Pharmacy Referral to Pharmacy Referral to Pharmacy  PCP or Specialist appointment within 3-5 days of discharge Complete Complete -  HRI or Home Care Consult  Complete Complete -  SW Recovery Care/Counseling Consult Complete Complete -  Palliative Care Screening Not Applicable Not Applicable -  Skilled Nursing Facility Complete Complete -  Some recent data might be hidden

## 2021-02-22 NOTE — Plan of Care (Signed)
  Problem: Nutrition: Goal: Adequate nutrition will be maintained Outcome: Progressing   Problem: Coping: Goal: Level of anxiety will decrease Outcome: Progressing   Problem: Pain Managment: Goal: General experience of comfort will improve Outcome: Progressing   Problem: Safety: Goal: Ability to remain free from injury will improve Outcome: Progressing   

## 2021-02-22 NOTE — Plan of Care (Signed)

## 2021-02-22 NOTE — Progress Notes (Signed)
Pt for d/c today. Contacted Michelle at [place] to advise her of pt's d/c today and pt will resume TTS schedule tomorrow on 2nd shift.   Melven Sartorius  Renal Navigator  [phone]

## 2021-02-22 NOTE — Progress Notes (Signed)
Back from dialysis.

## 2021-03-19 LAB — ACID FAST CULTURE WITH REFLEXED SENSITIVITIES (MYCOBACTERIA): Acid Fast Culture: NEGATIVE

## 2021-03-29 ENCOUNTER — Emergency Department (HOSPITAL_COMMUNITY)
Admission: EM | Admit: 2021-03-29 | Discharge: 2021-03-30 | Disposition: A | Discharge status: Home / Self Care | Payer: Medicare Other | Attending: Emergency Medicine | Admitting: Emergency Medicine

## 2021-03-29 ENCOUNTER — Other Ambulatory Visit: Payer: Self-pay

## 2021-03-29 ENCOUNTER — Encounter (HOSPITAL_COMMUNITY): Payer: Self-pay

## 2021-03-29 DIAGNOSIS — K6289 Other specified diseases of anus and rectum: Secondary | ICD-10-CM | POA: Diagnosis not present

## 2021-03-29 DIAGNOSIS — Z955 Presence of coronary angioplasty implant and graft: Secondary | ICD-10-CM | POA: Diagnosis not present

## 2021-03-29 DIAGNOSIS — I132 Hypertensive heart and chronic kidney disease with heart failure and with stage 5 chronic kidney disease, or end stage renal disease: Secondary | ICD-10-CM | POA: Diagnosis not present

## 2021-03-29 DIAGNOSIS — E1122 Type 2 diabetes mellitus with diabetic chronic kidney disease: Secondary | ICD-10-CM | POA: Insufficient documentation

## 2021-03-29 DIAGNOSIS — E113513 Type 2 diabetes mellitus with proliferative diabetic retinopathy with macular edema, bilateral: Secondary | ICD-10-CM | POA: Insufficient documentation

## 2021-03-29 DIAGNOSIS — I5032 Chronic diastolic (congestive) heart failure: Secondary | ICD-10-CM | POA: Diagnosis not present

## 2021-03-29 DIAGNOSIS — F17210 Nicotine dependence, cigarettes, uncomplicated: Secondary | ICD-10-CM | POA: Diagnosis not present

## 2021-03-29 DIAGNOSIS — Z992 Dependence on renal dialysis: Secondary | ICD-10-CM | POA: Insufficient documentation

## 2021-03-29 DIAGNOSIS — Z79899 Other long term (current) drug therapy: Secondary | ICD-10-CM | POA: Insufficient documentation

## 2021-03-29 DIAGNOSIS — Z7982 Long term (current) use of aspirin: Secondary | ICD-10-CM | POA: Diagnosis not present

## 2021-03-29 DIAGNOSIS — N186 End stage renal disease: Secondary | ICD-10-CM | POA: Diagnosis not present

## 2021-03-29 LAB — CBC WITH DIFFERENTIAL/PLATELET
Abs Immature Granulocytes: 0.03 10*3/uL (ref 0.00–0.07)
Basophils Absolute: 0 10*3/uL (ref 0.0–0.1)
Basophils Relative: 1 %
Eosinophils Absolute: 0.1 10*3/uL (ref 0.0–0.5)
Eosinophils Relative: 1 %
HCT: 35.2 % — ABNORMAL LOW (ref 36.0–46.0)
Hemoglobin: 10.8 g/dL — ABNORMAL LOW (ref 12.0–15.0)
Immature Granulocytes: 1 %
Lymphocytes Relative: 7 %
Lymphs Abs: 0.4 10*3/uL — ABNORMAL LOW (ref 0.7–4.0)
MCH: 28.6 pg (ref 26.0–34.0)
MCHC: 30.7 g/dL (ref 30.0–36.0)
MCV: 93.4 fL (ref 80.0–100.0)
Monocytes Absolute: 0.6 10*3/uL (ref 0.1–1.0)
Monocytes Relative: 10 %
Neutro Abs: 5.1 10*3/uL (ref 1.7–7.7)
Neutrophils Relative %: 80 %
Platelets: 132 10*3/uL — ABNORMAL LOW (ref 150–400)
RBC: 3.77 MIL/uL — ABNORMAL LOW (ref 3.87–5.11)
RDW: 18.4 % — ABNORMAL HIGH (ref 11.5–15.5)
WBC: 6.2 10*3/uL (ref 4.0–10.5)
nRBC: 0 % (ref 0.0–0.2)

## 2021-03-29 LAB — BASIC METABOLIC PANEL
Anion gap: 11 (ref 5–15)
BUN: 57 mg/dL — ABNORMAL HIGH (ref 6–20)
CO2: 27 mmol/L (ref 22–32)
Calcium: 9 mg/dL (ref 8.9–10.3)
Chloride: 95 mmol/L — ABNORMAL LOW (ref 98–111)
Creatinine, Ser: 5.23 mg/dL — ABNORMAL HIGH (ref 0.44–1.00)
GFR, Estimated: 9 mL/min — ABNORMAL LOW (ref >60)
Glucose, Bld: 158 mg/dL — ABNORMAL HIGH (ref 70–99)
Potassium: 4 mmol/L (ref 3.5–5.1)
Sodium: 133 mmol/L — ABNORMAL LOW (ref 135–145)

## 2021-03-29 MED ORDER — LIDOCAINE 4 % EX CREA
TOPICAL_CREAM | Freq: Once | CUTANEOUS | Status: AC
  Administered 2021-03-29: 1 via TOPICAL
  Filled 2021-03-29: qty 5

## 2021-03-29 MED ORDER — LIDOCAINE 4 % EX CREA: 1.0000 "application " | TOPICAL_CREAM | CUTANEOUS | 0 refills | Status: DC | PRN

## 2021-03-29 NOTE — Discharge Instructions (Addendum)
Thicker cushion or cushioning material can help heal your rectal area.  Apply topical creams as needed for dialysis sessions.  Call your primary care doctor or specialist as discussed in the next 2-3 days.   Return immediately back to the ER if:  Your symptoms worsen within the next 12-24 hours. You develop new symptoms such as new fevers, persistent vomiting, new pain, shortness of breath, or new weakness or numbness, or if you have any other concerns.

## 2021-03-29 NOTE — ED Notes (Signed)
[facility] called  

## 2021-03-29 NOTE — ED Triage Notes (Signed)
[facility] reports pt coming from [facility]. Staff states pt has missed dialysis due to her hemorrhoid pain because she can not sit in a chair that long. Pt went on Tuesday to dialysis and only got 57mn due to the pain. Pt is on 3L O2 [place] all time.

## 2021-03-29 NOTE — ED Provider Notes (Signed)
[facility] EMERGENCY DEPARTMENT Provider Note   CSN: [id number] Arrival date & time: 03/29/21  1532     History Chief Complaint  Patient presents with   Hemorrhoids    [name] Michael is a 60 y.o. female.  Patient presents to ER chief complaint of rectal pain.  She states that she is had it for "months and months."  She states that she cannot live flat or sit flat for dialysis.  She had dialysis as ago but was not able to complete 30 minutes due to rectal pain.  She missed dialysis yesterday due to rectal pain again as well.  Sent to the ER for evaluation by her nursing home.  Patient otherwise denies any fevers or cough no vomiting or diarrhea.  Denies chest pain or abdominal pain.  Denies any shortness of breath.      Past Medical History:  Diagnosis Date   Ambulates with cane    Constipation    Depression    ESRD (end stage renal disease) ([place])    a. TTS Dialysis.   GERD (gastroesophageal reflux disease)    History of stress test    a. 01/2003 MV: EF 74%, no ischemia/infarct.   Hyperlipidemia    Hypertension    Osteoporosis    Stroke [facility]) 04-01-11   left frontal subcortical, saw Dr. [name]    Syncope 11/2019   TIA (transient ischemic attack) 03-12-11   Tobacco abuse    Tricuspid regurgitation    a. 05/2016 Echo: EF 65-70%, Gr2DD, mild MR, nl RV fxn, Triv TR, PASP 15mHg; b. 05/2018 Echo: EF 60-65%, Gr2DD, mild MR/TR, RVSP/PASP 335mg; c. 11/2019 Echo: EF 55-60%, no rwma, mild-mod MR, Sev TR w/ RV dilatation (CTA chest neg for PE).   Type II diabetes mellitus ([facility]   Vitamin D deficiency     Patient Active Problem List   Diagnosis Date Noted   Acute on chronic respiratory failure with hypoxia (HCLake Davis08/29/2022   Pressure injury of skin 02/17/2021   Abdominal ascites 01/25/2021   Lymphocytopenia 10/13/2020   Ear hematoma, right, initial encounter 10/13/2020   Prolonged QT interval 10/13/2020   Lactic acidosis 10/05/2020   Transaminitis 10/05/2020    Thrombocytopenia ([facility]04/15/2022   Acute respiratory failure with hypoxia (HC[place]ats[place]04/15/2022   Altered mental status 030000000 Acute metabolic encephalopathy [id number] Severe tricuspid regurgitation 12/04/2019   ESRD (end stage renal disease) (H[place]   GERD (gastroesophageal reflux disease)    Hypertension    Type II diabetes mellitus ([facility]   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   AMS (altered mental status) 04/28/2019   Hypoxemia 04/28/2019   Fall 06/09/2018   Fall at home, initial encounter 06/09/2018   Anemia of chronic disease 06/09/2018   Syncope and collapse 06/09/2018   Acute encephalopathy 03/10/2018   Hypermagnesemia 03/10/2018   Acute lower UTI 11/09/2016   Uncontrolled type 2 diabetes mellitus with complication    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 associated with diabetes mellitus due to underlying condition (HCVerdi   Chronic diastolic CHF (congestive heart failure) ([facility]B[place]   Hypokalemia 06/11/2016   Proliferative diabetic retinopathy (HCS[place]11/12/2015   Poor social situation 03/09/2013   Depression 03/01/2013   Abnormal mammogram 12/20/2012   Retinal detachment 11/17/2012   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 (HCHazel [id number] Hyperlipidemia 02/09/2007   Essential hypertension 02/09/2007    Past Surgical History:  Procedure Laterality Date  AV FISTULA PLACEMENT Left 02/21/2019   Procedure: BRACHIOCEPHALIC ARTERIOVENOUS (AV) FISTULA CREATION;  Surgeon: [name], MD;  Location: [facility];  Service: Vascular;  Laterality: Left;   [place] Left 04/11/2019   Procedure: SECOND STAGE BASILIC VEIN TRANSPOSITION LEFT ARM;  Surgeon: [name], MD;  Location: [facility];  Service: Vascular;  Laterality: Left;   IR FLUORO GUIDE CV LINE RIGHT  04/29/2019   IR PARACENTESIS  02/20/2021   IR US GUIDE VASC ACCESS RIGHT  04/29/2019   OPEN REDUCTION INTERNAL FIXATION (ORIF) DISTAL RADIAL  FRACTURE Left 01/28/2016   Procedure: OPEN REDUCTION INTERNAL FIXATION (ORIF) DISTAL RADIAL FRACTURE;  Surgeon: Iran Planas, MD;  Location: [facility];  Service: Orthopedics;  Laterality: Left;   [place]   RIGHT HEART CATH N/A 12/05/2019   Procedure: RIGHT HEART CATH;  Surgeon: Nelva Bush, MD;  Location: [facility] CV LAB;  Service: Cardiovascular;  Laterality: N/A;   TONSILLECTOMY AND ADENOIDECTOMY     age 46     OB History   No obstetric history on file.     Family History  Problem Relation Age of Onset   Stroke Mother    Diabetes Mother    Kidney failure Mother    Heart failure Mother    Stroke Father    Cancer Sister        Breast- 57's   Colon cancer Maternal Grandmother    Pancreatic cancer Neg Hx    Esophageal cancer Neg Hx    Liver disease Neg Hx    Stomach cancer Neg Hx     Social History   Tobacco Use   Smoking status: Every Day    Packs/day: 0.30    Years: 32.00    Pack years: 9.60    Types: Cigarettes    Last attempt to quit: 09/23/2012    Years since quitting: 8.5    Passive exposure: Current   Smokeless tobacco: Never   Tobacco comments:    8 cigarettes/day  Vaping Use   Vaping Use: Never used  Substance Use Topics   Alcohol use: No   Drug use: No    Home Medications Prior to Admission medications   Medication Sig Start Date End Date Taking? Authorizing Provider  lidocaine (L-M-X 4) 4 % cream Apply 1 application topically as needed. Apply to affected rectal area prior to dialysis. 03/29/21  Yes Avett Reineck, Greggory Brandy, MD  Amino Acids-Protein Hydrolys (FEEDING SUPPLEMENT, PRO-STAT SUGAR FREE 64,) LIQD Take 30 mLs by mouth in the morning. [place] FLAVOR    [provider]  aspirin 81 MG chewable tablet Chew 81 mg by mouth daily.    [provider]  atorvastatin (LIPITOR) 40 MG tablet Take 40 mg by mouth at bedtime.     [provider]  b complex-vitamin c-folic acid (NEPHRO-VITE) 0.8 MG TABS  tablet Take 1 tablet by mouth at bedtime.    [provider]  carvedilol (COREG) 6.25 MG tablet Take 1 tablet (6.25 mg total) by mouth 2 (two) times daily with a meal. 02/22/21   Thurnell Lose, MD  cloNIDine (CATAPRES) 0.1 MG tablet Take 0.1 mg by mouth every 8 (eight) hours as needed (BP>160/90).    [provider]  FLUoxetine (PROZAC) 20 MG capsule Take 20 mg by mouth daily. 12/21/20   [provider]  levothyroxine (SYNTHROID) 75 MCG tablet Take 1 tablet (75 mcg total) by mouth daily at 6 (six) AM.  02/02/21   Regalado, [name], MD  Lido-PE-Glycerin-Petrolatum (PREPARATION H RAPID RELIEF EX) Apply topically every 6 (six) hours as needed (hemorrhoid).    [provider]  midodrine (PROAMATINE) 10 MG tablet Take 10 mg by mouth See admin instructions. Take 1 tablet (10 mg) by mouth prior to dialysis on Tuesday, Thursday, Saturday    [provider]  Nutritional Supplements (NOVASOURCE RENAL) LIQD Take 237 mLs by mouth with breakfast, with lunch, and with evening meal.    [provider]  omega-3 acid ethyl esters (LOVAZA) 1 g capsule Take 2 capsules by mouth 2 (two) times daily. 05/28/20   [provider]  omeprazole (PRILOSEC) 40 MG capsule Take 40 mg by mouth daily before breakfast.     [provider]  ondansetron (ZOFRAN) 4 MG tablet Take 4 mg by mouth every 8 (eight) hours as needed for nausea or vomiting.    [provider]  polyethylene glycol powder (GLYCOLAX/MIRALAX) 17 GM/SCOOP powder Take 17 g by mouth See admin instructions. Mix 17 grams of powder into 8 ounces of fluid, stir, and drink (by mouth) once a day    [provider]  SACCHAROMYCES BOULARDII PO Take 1 capsule by mouth 2 (two) times daily.    [provider]  senna-docusate (SENOKOT-S) 8.6-50 MG tablet Take 1 tablet by mouth at bedtime as needed for mild constipation. 06/11/18   Regalado, [name], MD  sodium bicarbonate 650 MG tablet  Take 1,300 mg by mouth 2 (two) times daily.    [provider]    Allergies    Hydrocodone  Review of Systems   Review of Systems  Constitutional:  Negative for fever.  HENT:  Negative for ear pain.   Eyes:  Negative for pain.  Respiratory:  Negative for cough.   Cardiovascular:  Negative for chest pain.  Gastrointestinal:  Negative for abdominal pain.  Genitourinary:  Negative for flank pain.  Musculoskeletal:  Negative for back pain.  Skin:  Negative for rash.  Neurological:  Negative for headaches.   Physical Exam Updated Vital Signs BP (!) 156/80 (BP Location: Right Arm)   Pulse 85   Temp 98.1 F (36.7 C) (Oral)   Resp 16   Ht '4\' 6"'$  (1.372 m)   Wt 38.1 kg   LMP 03/06/2013 (LMP Unknown)   SpO2 95%   BMI 20.25 kg/m   Physical Exam Constitutional:      General: She is not in acute distress.    Appearance: Normal appearance.  HENT:     Head: Normocephalic.     Nose: Nose normal.  Eyes:     Extraocular Movements: Extraocular movements intact.  Cardiovascular:     Rate and Rhythm: Normal rate.  Pulmonary:     Effort: Pulmonary effort is normal.  Abdominal:     Tenderness: There is no abdominal tenderness. There is no guarding or rebound.  Genitourinary:    Comments: Rectal exam done with nursing chaperone.  No hemorrhoids noted.  There is excoriation and skin breakdown seen in the perirectal region likely the cause of her pain.  No perirectal masses or hemorrhoids noted. Musculoskeletal:        General: Normal range of motion.     Cervical back: Normal range of motion.  Neurological:     General: No focal deficit present.     Mental Status: She is alert. Mental status is at baseline.    ED Results / Procedures / Treatments   Labs (all labs ordered  are listed, but only abnormal results are displayed) Labs Reviewed  CBC WITH DIFFERENTIAL/PLATELET - Abnormal; Notable for the following components:      Result Value   RBC 3.77 (*)    Hemoglobin 10.8  (*)    HCT 35.2 (*)    RDW 18.4 (*)    Platelets 132 (*)    Lymphs Abs 0.4 (*)    All other components within normal limits  BASIC METABOLIC PANEL - Abnormal; Notable for the following components:   Sodium 133 (*)    Chloride 95 (*)    Glucose, Bld 158 (*)    BUN 57 (*)    Creatinine, Ser 5.23 (*)    GFR, Estimated 9 (*)    All other components within normal limits    EKG None  Radiology No results found.  Procedures Procedures   Medications Ordered in ED Medications  lidocaine (LMX) 4 % cream (1 application Topical Given 03/29/21 1736)    ED Course  I have reviewed the triage vital signs and the nursing notes.  Pertinent labs & imaging results that were available during my care of the patient were reviewed by me and considered in my medical decision making (see chart for details).    MDM Rules/Calculators/A&P                           Lab work sent, consistent with history of end-stage kidney disease potassium appears normal at 4.0.  Patient on her baseline 2.5 L nasal cannula satting 95 to 98% on room air which is appropriate.  Topical lidocaine cream applied to rectal area with improvement of symptoms.  Patient be discharged home with prescription of lidocaine.  Advised use before dialysis periods.  Advise follow-up with primary care doctor within the week, advising immediate return for worsening symptoms fevers pain or any additional concerns.  Final Clinical Impression(s) / ED Diagnoses Final diagnoses:  Rectal pain    Rx / DC Orders ED Discharge Orders          Ordered    lidocaine (L-M-X 4) 4 % cream  As needed        03/29/21 2008             Oscar Forman S, MD 03/29/21 2009

## 2021-04-09 ENCOUNTER — Emergency Department (HOSPITAL_COMMUNITY): Payer: Medicare Other

## 2021-04-09 ENCOUNTER — Emergency Department (HOSPITAL_COMMUNITY)
Admission: EM | Admit: 2021-04-09 | Discharge: 2021-04-10 | Disposition: A | Discharge status: Home / Self Care | Payer: Medicare Other | Attending: Emergency Medicine | Admitting: Emergency Medicine

## 2021-04-09 DIAGNOSIS — Z79899 Other long term (current) drug therapy: Secondary | ICD-10-CM | POA: Insufficient documentation

## 2021-04-09 DIAGNOSIS — I132 Hypertensive heart and chronic kidney disease with heart failure and with stage 5 chronic kidney disease, or end stage renal disease: Secondary | ICD-10-CM | POA: Insufficient documentation

## 2021-04-09 DIAGNOSIS — F17210 Nicotine dependence, cigarettes, uncomplicated: Secondary | ICD-10-CM | POA: Insufficient documentation

## 2021-04-09 DIAGNOSIS — Z992 Dependence on renal dialysis: Secondary | ICD-10-CM | POA: Insufficient documentation

## 2021-04-09 DIAGNOSIS — R531 Weakness: Secondary | ICD-10-CM | POA: Diagnosis present

## 2021-04-09 DIAGNOSIS — Z7982 Long term (current) use of aspirin: Secondary | ICD-10-CM | POA: Insufficient documentation

## 2021-04-09 DIAGNOSIS — E039 Hypothyroidism, unspecified: Secondary | ICD-10-CM | POA: Insufficient documentation

## 2021-04-09 DIAGNOSIS — N186 End stage renal disease: Secondary | ICD-10-CM | POA: Diagnosis not present

## 2021-04-09 DIAGNOSIS — E1122 Type 2 diabetes mellitus with diabetic chronic kidney disease: Secondary | ICD-10-CM | POA: Diagnosis not present

## 2021-04-09 DIAGNOSIS — R1084 Generalized abdominal pain: Secondary | ICD-10-CM | POA: Diagnosis not present

## 2021-04-09 DIAGNOSIS — I5032 Chronic diastolic (congestive) heart failure: Secondary | ICD-10-CM | POA: Insufficient documentation

## 2021-04-09 LAB — COMPREHENSIVE METABOLIC PANEL
ALT: 20 U/L (ref 0–44)
AST: 56 U/L — ABNORMAL HIGH (ref 15–41)
Albumin: 2.7 g/dL — ABNORMAL LOW (ref 3.5–5.0)
Alkaline Phosphatase: 85 U/L (ref 38–126)
Anion gap: 15 (ref 5–15)
BUN: 40 mg/dL — ABNORMAL HIGH (ref 6–20)
CO2: 26 mmol/L (ref 22–32)
Calcium: 8.6 mg/dL — ABNORMAL LOW (ref 8.9–10.3)
Chloride: 91 mmol/L — ABNORMAL LOW (ref 98–111)
Creatinine, Ser: 3.76 mg/dL — ABNORMAL HIGH (ref 0.44–1.00)
GFR, Estimated: 13 mL/min — ABNORMAL LOW (ref >60)
Glucose, Bld: 95 mg/dL (ref 70–99)
Potassium: 4.3 mmol/L (ref 3.5–5.1)
Sodium: 132 mmol/L — ABNORMAL LOW (ref 135–145)
Total Bilirubin: 1.3 mg/dL — ABNORMAL HIGH (ref 0.3–1.2)
Total Protein: 5.9 g/dL — ABNORMAL LOW (ref 6.5–8.1)

## 2021-04-09 LAB — I-STAT ARTERIAL BLOOD GAS, ED
Acid-Base Excess: 7 mmol/L — ABNORMAL HIGH (ref 0.0–2.0)
Bicarbonate: 31.8 mmol/L — ABNORMAL HIGH (ref 20.0–28.0)
Calcium, Ion: 1.12 mmol/L — ABNORMAL LOW (ref 1.15–1.40)
HCT: 37 % (ref 36.0–46.0)
Hemoglobin: 12.6 g/dL (ref 12.0–15.0)
O2 Saturation: 91 %
Potassium: 3.3 mmol/L — ABNORMAL LOW (ref 3.5–5.1)
Sodium: 133 mmol/L — ABNORMAL LOW (ref 135–145)
TCO2: 33 mmol/L — ABNORMAL HIGH (ref 22–32)
pCO2 arterial: 46.9 mmHg (ref 32.0–48.0)
pH, Arterial: 7.439 (ref 7.350–7.450)
pO2, Arterial: 60 mmHg — ABNORMAL LOW (ref 83.0–108.0)

## 2021-04-09 LAB — TROPONIN I (HIGH SENSITIVITY): Troponin I (High Sensitivity): 60 ng/L — ABNORMAL HIGH (ref <18)

## 2021-04-09 LAB — LIPASE, BLOOD: Lipase: 26 U/L (ref 11–51)

## 2021-04-09 LAB — I-STAT BETA HCG BLOOD, ED (MC, WL, AP ONLY): I-stat hCG, quantitative: <5 m[IU]/mL (ref <5)

## 2021-04-09 LAB — TSH: TSH: 5.963 u[IU]/mL — ABNORMAL HIGH (ref 0.350–4.500)

## 2021-04-09 LAB — CBG MONITORING, ED
Glucose-Capillary: 92 mg/dL (ref 70–99)
Glucose-Capillary: 89 mg/dL (ref 70–99)

## 2021-04-09 MED ORDER — SODIUM CHLORIDE 0.9% FLUSH: 3.0000 mL | Freq: Once | INTRAVENOUS | Status: DC

## 2021-04-09 NOTE — ED Provider Notes (Signed)
  Face-to-face evaluation   History: Patient was at her nursing home this morning, her staff are giving her ready to go to dialysis however she was very tired.  She was not her usual self, which includes being awake alert, and smoking cigarettes.  Physical exam: Patient sleepy but arousable.  She has bilateral eyelid swelling.  No facial swelling.  No extremity edema.  Ascites appearance of abdomen is present.  Heart with regular rate and rhythm without murmur lungs clear anteriorly  Medical screening examination/treatment/procedure(s) were conducted as a shared visit with non-physician practitioner(s) and myself.  I personally evaluated the patient during the encounter    Daleen Bo, MD 04/10/21 [phone]

## 2021-04-09 NOTE — ED Notes (Signed)
RT at bedside for ABG

## 2021-04-09 NOTE — Progress Notes (Signed)
Contacted by ED provider regarding pt's missed HD treatment for today. Pt to be d/c this afternoon and needs HD treatment tomorrow. Spoke to [employer], Agricultural consultant, at [facility] [employer]. Pt can receive treatment at clinic tomorrow. Pt will need to arrive at 12:40 for 12:55 chair time. Will provide update to provider regarding above info so it can be passed along to pt's snf.  Melven Sartorius Renal Navigator [phone]

## 2021-04-09 NOTE — ED Notes (Signed)
[facility] called at 5:16; patient is 13th in line.

## 2021-04-09 NOTE — ED Notes (Signed)
Changed patients adult diaper.

## 2021-04-09 NOTE — ED Notes (Signed)
Spoke with lab, confirmed that Trop will be processed

## 2021-04-09 NOTE — ED Provider Notes (Signed)
[facility] EMERGENCY DEPARTMENT Provider Note   CSN: [id number] Arrival date & time: 04/09/21  1206     History Chief Complaint  Patient presents with   Weakness    [name] Michael is a 60 y.o. female with significant past medical history including end-stage renal disease, reflux, history of stroke, history of TIA, hypertension, diabetes, chronic tobacco use who is on 2.5 L nasal cannula at baseline who presents from nursing home on the day she was due for dialysis because she was somnolent, not easily arousable, and not ambulatory.  Called her care home who reports that normally she is ambulatory with a walker, she is easily arousable, is usually up early before her dialysis smoking 6 to 7 cigarettes, and has something to eat.  This morning when they went to see her, she was only arousable to touch, not oriented, and was not able to be aroused enough to walk, she remained similar when seen by EMS.  On my evaluation patient is alert and oriented x3, although she is still sleepy, laying on her side, she denies any other pain or complaints, she was seen around a week ago, for similar symptoms prior to dialysis.   Weakness     Past Medical History:  Diagnosis Date   Ambulates with cane    Constipation    Depression    ESRD (end stage renal disease) ([place])    a. TTS Dialysis.   GERD (gastroesophageal reflux disease)    History of stress test    a. 01/2003 MV: EF 74%, no ischemia/infarct.   Hyperlipidemia    Hypertension    Osteoporosis    Stroke [facility]) 04-01-11   left frontal subcortical, saw Dr. [name]    Syncope 11/2019   TIA (transient ischemic attack) 03-12-11   Tobacco abuse    Tricuspid regurgitation    a. 05/2016 Echo: EF 65-70%, Gr2DD, mild MR, nl RV fxn, Triv TR, PASP 32mHg; b. 05/2018 Echo: EF 60-65%, Gr2DD, mild MR/TR, RVSP/PASP 376mg; c. 11/2019 Echo: EF 55-60%, no rwma, mild-mod MR, Sev TR w/ RV dilatation (CTA chest neg for PE).   Type II diabetes  mellitus ([facility]   Vitamin D deficiency     Patient Active Problem List   Diagnosis Date Noted   Acute on chronic respiratory failure with hypoxia ([facility]08/29/2022   Pressure injury of skin 02/17/2021   Abdominal ascites 01/25/2021   Lymphocytopenia 10/13/2020   Ear hematoma, right, initial encounter 10/13/2020   Prolonged QT interval 10/13/2020   Lactic acidosis 10/05/2020   Transaminitis 10/05/2020   Thrombocytopenia ([facility]04/15/2022   Acute respiratory failure with hypoxia ([facility] [place] [facility]04/15/2022   Altered mental status 030000000 Acute metabolic encephalopathy 0399991111 Severe tricuspid regurgitation 12/04/2019   ESRD (end stage renal disease) ([facility] [facility]   GERD (gastroesophageal reflux disease)    Hypertension    Type II diabetes mellitus ([facility]   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   AMS (altered mental status) 04/28/2019   Hypoxemia 04/28/2019   Fall 06/09/2018   Fall at home, initial encounter 06/09/2018   Anemia of chronic disease 06/09/2018   Syncope and collapse 06/09/2018   Acute encephalopathy 03/10/2018   Hypermagnesemia 03/10/2018   Acute lower UTI 11/09/2016   Uncontrolled type 2 diabetes mellitus with complication    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 associated with diabetes mellitus due to underlying condition (HCC)    Chronic diastolic CHF (congestive heart failure) ([facility]  Hypokalemia 06/11/2016   Proliferative diabetic retinopathy ([place]) 04/29/2016   Poor social situation 03/09/2013   Depression 03/01/2013   Abnormal mammogram 12/20/2012   Retinal detachment 11/17/2012   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 ([place]) 123456   Hyperlipidemia 02/09/2007   Essential hypertension 02/09/2007    Past Surgical History:  Procedure Laterality Date   AV FISTULA PLACEMENT Left 02/21/2019   Procedure: BRACHIOCEPHALIC ARTERIOVENOUS (AV) FISTULA CREATION;  Surgeon: [name], MD;  Location: [facility];   Service: Vascular;  Laterality: Left;   [place] Left 04/11/2019   Procedure: SECOND STAGE BASILIC VEIN TRANSPOSITION LEFT ARM;  Surgeon: [name], MD;  Location: [facility];  Service: Vascular;  Laterality: Left;   IR FLUORO GUIDE CV LINE RIGHT  04/29/2019   IR PARACENTESIS  02/20/2021   IR US GUIDE VASC ACCESS RIGHT  04/29/2019   OPEN REDUCTION INTERNAL FIXATION (ORIF) DISTAL RADIAL FRACTURE Left 01/28/2016   Procedure: OPEN REDUCTION INTERNAL FIXATION (ORIF) DISTAL RADIAL FRACTURE;  Surgeon: Iran Planas, MD;  Location: [facility];  Service: Orthopedics;  Laterality: Left;   [place]   RIGHT HEART CATH N/A 12/05/2019   Procedure: RIGHT HEART CATH;  Surgeon: Nelva Bush, MD;  Location: [facility] CV [facility];  Service: Cardiovascular;  Laterality: N/A;   TONSILLECTOMY AND ADENOIDECTOMY     age 22     OB History   No obstetric history on file.     Family History  Problem Relation Age of Onset   Stroke Mother    Diabetes Mother    Kidney failure Mother    Heart failure Mother    Stroke Father    Cancer Sister        Breast- 15's   Colon cancer Maternal Grandmother    Pancreatic cancer Neg Hx    Esophageal cancer Neg Hx    Liver disease Neg Hx    Stomach cancer Neg Hx     Social History   Tobacco Use   Smoking status: Every Day    Packs/day: 0.30    Years: 32.00    Pack years: 9.60    Types: Cigarettes    Last attempt to quit: 09/23/2012    Years since quitting: 8.5    Passive exposure: Current   Smokeless tobacco: Never   Tobacco comments:    8 cigarettes/day  Vaping Use   Vaping Use: Never used  Substance Use Topics   Alcohol use: No   Drug use: No    Home Medications Prior to Admission medications   Medication Sig Start Date End Date Taking? Authorizing Provider  Amino Acids-Protein Hydrolys (FEEDING SUPPLEMENT, PRO-STAT SUGAR FREE 64,) LIQD Take 30 mLs by mouth in the morning. WILD CHERRY FLAVOR   Yes  [provider]  aspirin 81 MG chewable tablet Chew 81 mg by mouth daily.   Yes [provider]  atorvastatin (LIPITOR) 40 MG tablet Take 40 mg by mouth at bedtime.    Yes [provider]  b complex-vitamin c-folic acid (NEPHRO-VITE) 0.8 MG TABS tablet Take 1 tablet by mouth at bedtime.   Yes [provider]  carvedilol (COREG) 6.25 MG tablet Take 1 tablet (6.25 mg total) by mouth 2 (two) times daily with a meal. 02/22/21  Yes Thurnell Lose, MD  cloNIDine (CATAPRES) 0.1 MG tablet Take 0.1 mg by mouth every 8 (eight) hours as needed (BP>160/90).   Yes [provider]  esomeprazole (  NEXIUM) 40 MG capsule Take 40 mg by mouth daily.   Yes [provider]  FLUoxetine (PROZAC) 20 MG capsule Take 20 mg by mouth daily. 12/21/20  Yes [provider]  levothyroxine (SYNTHROID) 75 MCG tablet Take 1 tablet (75 mcg total) by mouth daily at 6 (six) AM. 02/02/21  Yes Regalado, [name], MD  Lido-PE-Glycerin-Petrolatum (PREPARATION H RAPID RELIEF EX) Apply topically every 6 (six) hours as needed (hemorrhoid).   Yes [provider]  lidocaine (L-M-X 4) 4 % cream Apply 1 application topically as needed. Apply to affected rectal area prior to dialysis. 03/29/21  Yes Luna Fuse, MD  midodrine (PROAMATINE) 10 MG tablet Take 10 mg by mouth See admin instructions. Take 1 tablet (10 mg) by mouth prior to dialysis on Tuesday, Thursday, Saturday   Yes [provider]  Nutritional Supplements (NOVASOURCE RENAL) LIQD Take 237 mLs by mouth with breakfast, with lunch, and with evening meal.   Yes [provider]  omega-3 acid ethyl esters (LOVAZA) 1 g capsule Take 2 capsules by mouth 2 (two) times daily. 05/28/20  Yes [provider]  ondansetron (ZOFRAN) 4 MG tablet Take 4 mg by mouth every 8 (eight) hours as needed for nausea or vomiting.   Yes [provider]  polyethylene glycol powder (GLYCOLAX/MIRALAX) 17 GM/SCOOP  powder Take 17 g by mouth See admin instructions. Mix 17 grams of powder into 8 ounces of fluid, stir, and drink (by mouth) once a day   Yes [provider]  SACCHAROMYCES BOULARDII PO Take 1 capsule by mouth 2 (two) times daily.   Yes [provider]  senna-docusate (SENOKOT-S) 8.6-50 MG tablet Take 1 tablet by mouth at bedtime as needed for mild constipation. 06/11/18  Yes Regalado, [name], MD  sodium bicarbonate 650 MG tablet Take 1,300 mg by mouth 2 (two) times daily.   Yes [provider]    Allergies    Hydrocodone  Review of Systems   Review of Systems  Neurological:  Positive for weakness.       Somnolence  All other systems reviewed and are negative.  Physical Exam Updated Vital Signs BP (!) 164/80   Pulse 70   Temp 98.9 F (37.2 C) (Oral)   Resp (!) 21   LMP 03/06/2013 (LMP Unknown)   SpO2 96%   Physical Exam Vitals and nursing note reviewed.  Constitutional:      Comments: Ill-appearing sleepy female laying on bed curled up in no acute distress, arousable by voice and touch  HENT:     Head: Normocephalic and atraumatic.  Eyes:     General:        Right eye: No discharge.        Left eye: No discharge.  Cardiovascular:     Rate and Rhythm: Normal rate and regular rhythm.  Pulmonary:     Effort: Pulmonary effort is normal. No respiratory distress.     Breath sounds: Normal breath sounds.     Comments: No respiratory distress, normal respiratory effort on 2.5 L of nasal cannula Abdominal:     General: Bowel sounds are normal.     Palpations: Abdomen is soft.     Comments: She does have some abdominal distention, diffuse tenderness throughout the abdomen, she does not have any rebound, rigidity, guarding.  No focal tenderness.  No suprapubic tenderness.   Musculoskeletal:     Cervical back: Normal range of motion and neck supple.  Skin:    General: Skin  is warm and dry.     Capillary Refill: Capillary refill takes less than 2  seconds.  Neurological:     Mental Status: She is oriented to person, place, and time.  Psychiatric:        Mood and Affect: Mood normal.        Behavior: Behavior normal.    ED Results / Procedures / Treatments   Labs (all labs ordered are listed, but only abnormal results are displayed) Labs Reviewed  COMPREHENSIVE METABOLIC PANEL - Abnormal; Notable for the following components:      Result Value   Sodium 132 (*)    Chloride 91 (*)    BUN 40 (*)    Creatinine, Ser 3.76 (*)    Calcium 8.6 (*)    Total Protein 5.9 (*)    Albumin 2.7 (*)    AST 56 (*)    Total Bilirubin 1.3 (*)    GFR, Estimated 13 (*)    All other components within normal limits  TSH - Abnormal; Notable for the following components:   TSH 5.963 (*)    All other components within normal limits  I-STAT ARTERIAL BLOOD GAS, ED - Abnormal; Notable for the following components:   pO2, Arterial 60 (*)    Bicarbonate 31.8 (*)    TCO2 33 (*)    Acid-Base Excess 7.0 (*)    Sodium 133 (*)    Potassium 3.3 (*)    Calcium, Ion 1.12 (*)    All other components within normal limits  LIPASE, BLOOD  BLOOD GAS, ARTERIAL  CBG MONITORING, ED  CBG MONITORING, ED  I-STAT BETA HCG BLOOD, ED ([facility], [facility], AP ONLY)  TROPONIN I (HIGH SENSITIVITY)    EKG EKG Interpretation  Date/Time:  Tuesday April 09 2021 12:35:53 EDT Ventricular Rate:  68 PR Interval:  147 QRS Duration: 158 QT Interval:  535 QTC Calculation: 570 R Axis:   129 Text Interpretation: Sinus rhythm RBBB and LPFB Abnormal T, consider ischemia, diffuse leads Since last tracing T wave abnormality is more pronounced Confirmed by Daleen Bo [phone]) on 04/09/2021 12:51:42 PM  Radiology DG Chest Port 1 View  Result Date: 04/09/2021 CLINICAL DATA:  Fatigue. EXAM: PORTABLE CHEST 1 VIEW COMPARISON:  CT a chest in chest x-ray dated February 18, 2021. FINDINGS: Stable cardiomegaly. Normal pulmonary vascularity. Mild right basilar atelectasis. No consolidation,  pneumothorax, or large pleural effusion. No acute osseous abnormality. IMPRESSION: 1. Mild right basilar atelectasis. Electronically Signed   By: Titus Dubin M.D.   On: 04/09/2021 13:22    Procedures Procedures   Medications Ordered in ED Medications  sodium chloride flush (NS) 0.9 % injection 3 mL (3 mLs Intravenous Not Given 04/09/21 1235)    ED Course  I have reviewed the triage vital signs and the nursing notes.  Pertinent labs & imaging results that were available during my care of the patient were reviewed by me and considered in my medical decision making (see chart for details).    MDM Rules/Calculators/A&P                         I discussed this case with my attending physician who cosigned this note including patient's presenting symptoms, physical exam, and planned diagnostics and interventions. Attending physician stated agreement with plan or made changes to plan which were implemented.   Attending physician assessed patient at bedside.  Patient is afebrile, overall normal physical exam, minimal concern for infectious etiology behind altered  energy level, weakness.  Patient is to for dialysis today, some of weakness and lethargy may be due to uremia, electrolyte disturbance.  Patient is a chronic smoker, chronically on oxygen, some concern for hypercarbic respiratory failure.  Will obtain arterial blood gas.  Normal CBG, no concern for hyper or hypoglycemia.  Chest x-ray not significant for pneumonia, does reveal some mild right basilar atelectasis.  Some increase in T wave abnormality compared to previous EKG.  Blood gas does show some hypoxia elevated bicarb otherwise normal total CO2. No evidence of hypercarbic respiratory failure, Patient does have elevated TSH suggesting some hypothyroidism, however this is fairly stable compared to previous values.  On reevaluation patient continues to be somewhat sleepy, but is easily arousable, responds to all questions without  difficulty.  No evidence of emergent condition at this time, patient stable for discharge.  We will give her a trial of some food and water she has not eaten all day.  Patient was able to eat and drink without difficulty, was aroused and much more alert when attempting to eat and drink.  Spoke with patient's care facility and will discharge at this time with return precautions.  Spoke with renal coordinator regarding rescheduling dialysis: Arrive at 12:40pm for dialysis at 12:55 at [facility]. Final Clinical Impression(s) / ED Diagnoses Final diagnoses:  Weakness  Hypothyroidism, unspecified type    Rx / DC Orders ED Discharge Orders     None        Anselmo Pickler, PA-C 04/09/21 1641    Daleen Bo, MD 04/10/21 [phone]

## 2021-04-09 NOTE — ED Notes (Signed)
ED Provider at bedside. 

## 2021-04-09 NOTE — Progress Notes (Signed)
ABG attempted 2x unsuccessfully. Another RT will attempt to obtain ABG.

## 2021-04-09 NOTE — Discharge Instructions (Signed)
The patient's workup was unremarkable today, we did see that she has some evidence of hypothyroidism, she may need some adjustment of her thyroid medication.  She is due for dialysis tomorrow, we spoke with our kidney care coordinator who has scheduled her to go to her normal [facility] kidney facility tomorrow at 1255, she is to arrive at 1240 for that appointment.  Patient was easily arousable to [name] although she does complain of feeling tired, however she was able to follow commands, is alert and oriented, was able to eat and drink without difficulty.

## 2021-04-09 NOTE — ED Triage Notes (Signed)
Pt coming from [facility] with [facility]. SNF reports patient non-ambulatory today and more tired than usual. Scheduled for dialysis today. AOx2 person, event - reported to be baseline for pt by EMS.

## 2021-04-09 NOTE — ED Notes (Signed)
Pt lying on side, responds to voice.

## 2021-04-10 NOTE — ED Notes (Signed)
Pt transported back to [facility] via [place].

## 2021-04-16 ENCOUNTER — Other Ambulatory Visit: Payer: Self-pay

## 2021-04-16 ENCOUNTER — Emergency Department (HOSPITAL_COMMUNITY)
Admission: EM | Admit: 2021-04-16 | Discharge: 2021-04-17 | Disposition: A | Discharge status: Home / Self Care | Payer: Medicare Other | Attending: Emergency Medicine | Admitting: Emergency Medicine

## 2021-04-16 DIAGNOSIS — N186 End stage renal disease: Secondary | ICD-10-CM | POA: Insufficient documentation

## 2021-04-16 DIAGNOSIS — Z7982 Long term (current) use of aspirin: Secondary | ICD-10-CM | POA: Diagnosis not present

## 2021-04-16 DIAGNOSIS — W19XXXA Unspecified fall, initial encounter: Secondary | ICD-10-CM

## 2021-04-16 DIAGNOSIS — I5032 Chronic diastolic (congestive) heart failure: Secondary | ICD-10-CM | POA: Insufficient documentation

## 2021-04-16 DIAGNOSIS — Z79899 Other long term (current) drug therapy: Secondary | ICD-10-CM | POA: Insufficient documentation

## 2021-04-16 DIAGNOSIS — M549 Dorsalgia, unspecified: Secondary | ICD-10-CM | POA: Diagnosis not present

## 2021-04-16 DIAGNOSIS — S0990XA Unspecified injury of head, initial encounter: Secondary | ICD-10-CM | POA: Diagnosis present

## 2021-04-16 DIAGNOSIS — W01198A Fall on same level from slipping, tripping and stumbling with subsequent striking against other object, initial encounter: Secondary | ICD-10-CM | POA: Insufficient documentation

## 2021-04-16 DIAGNOSIS — F17210 Nicotine dependence, cigarettes, uncomplicated: Secondary | ICD-10-CM | POA: Insufficient documentation

## 2021-04-16 DIAGNOSIS — Z992 Dependence on renal dialysis: Secondary | ICD-10-CM | POA: Insufficient documentation

## 2021-04-16 DIAGNOSIS — M542 Cervicalgia: Secondary | ICD-10-CM | POA: Insufficient documentation

## 2021-04-16 DIAGNOSIS — E1122 Type 2 diabetes mellitus with diabetic chronic kidney disease: Secondary | ICD-10-CM | POA: Diagnosis not present

## 2021-04-16 DIAGNOSIS — S0003XA Contusion of scalp, initial encounter: Secondary | ICD-10-CM | POA: Diagnosis not present

## 2021-04-16 DIAGNOSIS — I132 Hypertensive heart and chronic kidney disease with heart failure and with stage 5 chronic kidney disease, or end stage renal disease: Secondary | ICD-10-CM | POA: Insufficient documentation

## 2021-04-16 NOTE — ED Triage Notes (Signed)
Here via [place] from [facility]. Pt was smoking a cigarette on the patio, she fell backward. States this happens to her on a regular basis.   No loc.  No blood thinners. AO x 4.    Pain post neck, lower back, and L leg.  Pt is dialysis pt.  Had dialysis today.  158/90 Rr 18 Sats 97% Hr 77

## 2021-04-16 NOTE — ED Provider Notes (Signed)
Emergency Medicine Provider Triage Evaluation Note  [name] Michael , a 60 y.o. female  was evaluated in triage.  Pt complains of mechanical fall today at [facility].  Patient states she was taking some trash can when she lost her footing and fell backwards and hit her head on the concrete.  She does endorse LOC but denies any blurry or double vision, nausea or vomiting since that time.  She does endorse pain in her neck as well.  Patient was admitted to [facility] due to weakness and recurrent falls.  Review of Systems  Positive: Head trauma LOC, neck pain Negative: Blurry or double vision, nausea, vomitin  Physical Exam  BP (!) 168/82 (BP Location: Right Arm)   Pulse 79   Temp 98.2 F (36.8 C) (Oral)   Resp 16   Ht 4\' 6"  (1.372 m)   Wt 38.1 kg   LMP 03/06/2013 (LMP Unknown)   SpO2 98%   BMI 20.25 kg/m  Gen:   Awake, no distress   Resp:  Normal effort  MSK:   Moves extremities without difficulty  Other:  PERRL, EOMI.  Symmetric strength and sensation in upper and lower extremities bilaterally.  Cranial nerves are intact.  No full deficit on neuro exam.  Patient ANO x4.  Medical Decision Making  Medically screening exam initiated at 9:32 PM.  Appropriate orders placed.  [name] Michael was informed that the remainder of the evaluation will be completed by another provider, this initial triage assessment does not replace that evaluation, and the importance of remaining in the ED until their evaluation is complete.  This chart was dictated using voice recognition software, Dragon. Despite the best efforts of this provider to proofread and correct errors, errors may still occur which can change documentation meaning.    [name] Michael 04/16/21 2141    Charlesetta Shanks, MD 04/17/21 1626

## 2021-04-17 ENCOUNTER — Emergency Department (HOSPITAL_COMMUNITY): Payer: Medicare Other

## 2021-04-17 DIAGNOSIS — S0003XA Contusion of scalp, initial encounter: Secondary | ICD-10-CM | POA: Diagnosis not present

## 2021-04-17 MED ORDER — ACETAMINOPHEN 325 MG PO TABS
650.0000 mg | ORAL_TABLET | Freq: Once | ORAL | Status: AC
  Administered 2021-04-17: 650 mg via ORAL
  Filled 2021-04-17: qty 2

## 2021-04-17 NOTE — ED Notes (Signed)
Pt requesting to be changed, peri care provided and new brief placed on pt.

## 2021-04-17 NOTE — ED Notes (Signed)
Pt's resting comfortably, eyes closed at this time.

## 2021-04-17 NOTE — ED Notes (Signed)
Ptar called 

## 2021-04-17 NOTE — ED Provider Notes (Signed)
[facility] EMERGENCY DEPARTMENT Provider Note   CSN: [id number] Arrival date & time: 04/16/21  1812     History Chief Complaint  Patient presents with   [name] Michael is a 60 y.o. female.  The history is provided by the patient and medical records.  Fall [name] Michael is a 60 y.o. female who presents to the Emergency Department complaining of fall. She presents the emergency department by EMS for evaluation of injuries following a fall today. She is a resident at [facility] assisted living facility. She states that she was walking to the trash when she fell backwards, striking her head. She is unsure of loss of consciousness. She complains of pain to the back of her head, back of her neck as well as her mid back. She has a history of ESR D and had dialysis today. She takes aspirin. She reports chronic diarrhea - unchanged from baseline. No chest pain, difficulty breathing, abdominal pain. She normally ambulates with a walker.     Past Medical History:  Diagnosis Date   Ambulates with cane    Constipation    Depression    ESRD (end stage renal disease) ([place])    a. TTS Dialysis.   GERD (gastroesophageal reflux disease)    History of stress test    a. 01/2003 MV: EF 74%, no ischemia/infarct.   Hyperlipidemia    Hypertension    Osteoporosis    Stroke [facility]) 04-01-11   left frontal subcortical, saw Dr. [name]    Syncope 11/2019   TIA (transient ischemic attack) 03-12-11   Tobacco abuse    Tricuspid regurgitation    a. 05/2016 Echo: EF 65-70%, Gr2DD, mild MR, nl RV fxn, Triv TR, PASP 44mHg; b. 05/2018 Echo: EF 60-65%, Gr2DD, mild MR/TR, RVSP/PASP 388mg; c. 11/2019 Echo: EF 55-60%, no rwma, mild-mod MR, Sev TR w/ RV dilatation (CTA chest neg for PE).   Type II diabetes mellitus ([facility]   Vitamin D deficiency     Patient Active Problem List   Diagnosis Date Noted   Acute on chronic respiratory failure with hypoxia (HC[place]08/29/2022   Pressure injury  of skin 02/17/2021   Abdominal ascites 01/25/2021   Lymphocytopenia 10/13/2020   Ear hematoma, right, initial encounter 10/13/2020   Prolonged QT interval 10/13/2020   Lactic acidosis 10/05/2020   Transaminitis 10/05/2020   Thrombocytopenia (HCL[place]04/15/2022   Acute respiratory failure with hypoxia (HC[place]04/15/2022   Altered mental status 0380/88/1103 Acute metabolic encephalopathy 0315/94/5859 Severe tricuspid regurgitation 12/04/2019   ESRD (end stage renal disease) ([facility]   GERD (gastroesophageal reflux disease)    Hypertension    Type II diabetes mellitus ([facility]   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   AMS (altered mental status) 04/28/2019   Hypoxemia 04/28/2019   Fall 06/09/2018   Fall at home, initial encounter 06/09/2018   Anemia of chronic disease 06/09/2018   Syncope and collapse 06/09/2018   Acute encephalopathy 03/10/2018   Hypermagnesemia 03/10/2018   Acute lower UTI 11/09/2016   Uncontrolled type 2 diabetes mellitus with complication    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 associated with diabetes mellitus due to underlying condition (HCC)    Chronic diastolic CHF (congestive heart failure) ([facility]   Hypokalemia 06/11/2016   Proliferative diabetic retinopathy (HCS[place]wood11/12/2015   Poor social situation 03/09/2013   Depression 03/01/2013   Abnormal mammogram 12/20/2012   Retinal detachment 11/17/2012   Poorly  controlled type II diabetes mellitus with renal complication ([place]) 42/87/6811   Hyperlipidemia 02/09/2007   Essential hypertension 02/09/2007    Past Surgical History:  Procedure Laterality Date   AV FISTULA PLACEMENT Left 02/21/2019   Procedure: BRACHIOCEPHALIC ARTERIOVENOUS (AV) FISTULA CREATION;  Surgeon: [name], MD;  Location: [place];  Service: Vascular;  Laterality: Left;   [place] Left 04/11/2019   Procedure: SECOND STAGE BASILIC VEIN TRANSPOSITION LEFT ARM;  Surgeon: [name], MD;  Location:  [place];  Service: Vascular;  Laterality: Left;   IR FLUORO GUIDE CV LINE RIGHT  04/29/2019   IR PARACENTESIS  02/20/2021   IR US GUIDE VASC ACCESS RIGHT  04/29/2019   OPEN REDUCTION INTERNAL FIXATION (ORIF) DISTAL RADIAL FRACTURE Left 01/28/2016   Procedure: OPEN REDUCTION INTERNAL FIXATION (ORIF) DISTAL RADIAL FRACTURE;  Surgeon: Iran Planas, MD;  Location: [place];  Service: Orthopedics;  Laterality: Left;   [place]   RIGHT HEART CATH N/A 12/05/2019   Procedure: RIGHT HEART CATH;  Surgeon: Nelva Bush, MD;  Location: [place] [facility];  Service: Cardiovascular;  Laterality: N/A;   TONSILLECTOMY AND ADENOIDECTOMY     age 7     OB History   No obstetric history on file.     Family History  Problem Relation Age of Onset   Stroke Mother    Diabetes Mother    Kidney failure Mother    Heart failure Mother    Stroke Father    Cancer Sister        Breast- 23's   Colon cancer Maternal Grandmother    Pancreatic cancer Neg Hx    Esophageal cancer Neg Hx    Liver disease Neg Hx    Stomach cancer Neg Hx     Social History   Tobacco Use   Smoking status: Every Day    Packs/day: 0.30    Years: 32.00    Pack years: 9.60    Types: Cigarettes    Last attempt to quit: 09/23/2012    Years since quitting: 8.5    Passive exposure: Current   Smokeless tobacco: Never   Tobacco comments:    8 cigarettes/day  Vaping Use   Vaping Use: Never used  Substance Use Topics   Alcohol use: No   Drug use: No    Home Medications Prior to Admission medications   Medication Sig Start Date End Date Taking? Authorizing Provider  Amino Acids-Protein Hydrolys (FEEDING SUPPLEMENT, PRO-STAT SUGAR FREE 64,) LIQD Take 30 mLs by mouth in the morning. WILD CHERRY FLAVOR    [provider]  aspirin 81 MG chewable tablet Chew 81 mg by mouth daily.    [provider]  atorvastatin (LIPITOR) 40 MG tablet Take 40 mg by mouth at bedtime.     [provider]  b complex-vitamin c-folic acid (NEPHRO-VITE) 0.8 MG TABS tablet Take 1 tablet by mouth at bedtime.    [provider]  carvedilol (COREG) 6.25 MG tablet Take 1 tablet (6.25 mg total) by mouth 2 (two) times daily with a meal. 02/22/21   Thurnell Lose, MD  cloNIDine (CATAPRES) 0.1 MG tablet Take 0.1 mg by mouth every 8 (eight) hours as needed (BP>160/90).    [provider]  esomeprazole (NEXIUM) 40 MG capsule Take 40 mg by mouth daily.    [provider]  FLUoxetine (PROZAC) 20 MG capsule Take 20 mg by mouth daily. 12/21/20   [provider]  levothyroxine (SYNTHROID) 75 MCG tablet Take 1 tablet (75 mcg total) by mouth daily at 6 (six) AM. 02/02/21   Regalado, [name], MD  Lido-PE-Glycerin-Petrolatum (PREPARATION H RAPID RELIEF EX) Apply topically every 6 (six) hours as needed (hemorrhoid).    [provider]  lidocaine (L-M-X 4) 4 % cream Apply 1 application topically as needed. Apply to affected rectal area prior to dialysis. 03/29/21   Luna Fuse, MD  midodrine (PROAMATINE) 10 MG tablet Take 10 mg by mouth See admin instructions. Take 1 tablet (10 mg) by mouth prior to dialysis on Tuesday, Thursday, Saturday    [provider]  Nutritional Supplements (NOVASOURCE RENAL) LIQD Take 237 mLs by mouth with breakfast, with lunch, and with evening meal.    [provider]  omega-3 acid ethyl esters (LOVAZA) 1 g capsule Take 2 capsules by mouth 2 (two) times daily. 05/28/20   [provider]  ondansetron (ZOFRAN) 4 MG tablet Take 4 mg by mouth every 8 (eight) hours as needed for nausea or vomiting.    [provider]  polyethylene glycol powder (GLYCOLAX/MIRALAX) 17 GM/SCOOP powder Take 17 g by mouth See admin instructions. Mix 17 grams of powder into 8 ounces of fluid, stir, and drink (by mouth) once a day    [provider]  SACCHAROMYCES BOULARDII PO Take 1 capsule by mouth 2 (two) times daily.     [provider]  senna-docusate (SENOKOT-S) 8.6-50 MG tablet Take 1 tablet by mouth at bedtime as needed for mild constipation. 06/11/18   Regalado, [name], MD  sodium bicarbonate 650 MG tablet Take 1,300 mg by mouth 2 (two) times daily.    [provider]    Allergies    Hydrocodone  Review of Systems   Review of Systems  All other systems reviewed and are negative.  Physical Exam Updated Vital Signs BP (!) 143/118   Pulse 86   Temp 98.7 F (37.1 C) (Oral)   Resp 18   Ht 4' 6"  (1.372 m)   Wt 38.1 kg   LMP 03/06/2013 (LMP Unknown)   SpO2 90%   BMI 20.25 kg/m   Physical Exam Vitals and nursing note reviewed.  Constitutional:      Appearance: She is well-developed.  HENT:     Head: Normocephalic.     Comments: No significant soft tissue swelling to the posterior scalp Neck:     Comments: No midline cervical spine tenderness Cardiovascular:     Rate and Rhythm: Normal rate and regular rhythm.     Heart sounds: No murmur heard. Pulmonary:     Effort: Pulmonary effort is normal. No respiratory distress.     Breath sounds: Normal breath sounds.  Abdominal:     Palpations: Abdomen is soft.     Tenderness: There is no abdominal tenderness. There is no guarding or rebound.  Musculoskeletal:     Comments: Tenderness to palpation throughout the back without discrete bony tenderness.  Skin:    General: Skin is warm and dry.  Neurological:     Mental Status: She is alert and oriented to person, place, and time.     Comments: Five out of five strength in all four extremities with sensation to light touch intact in all four extremities  Psychiatric:        Behavior: Behavior normal.    ED Results / Procedures / Treatments   Labs (all labs ordered are listed, but only abnormal results are displayed) Labs  Reviewed - No data to display  EKG None  Radiology DG Thoracic Spine 2 View  Result Date: 04/17/2021 CLINICAL DATA:  Fall with back pain EXAM:  THORACIC SPINE 2 VIEWS COMPARISON:  01/22/2021 FINDINGS: There is no evidence of thoracic spine fracture. Alignment is normal. No other significant bone abnormalities are identified. Generalized osteopenia IMPRESSION: No acute finding. Electronically Signed   By: Jorje Guild M.D.   On: 04/17/2021 04:27   DG Lumbar Spine Complete  Result Date: 04/17/2021 CLINICAL DATA:  Fall with back pain EXAM: LUMBAR SPINE - COMPLETE 4+ VIEW COMPARISON:  Abdominal CT 01/25/2021 FINDINGS: There is no evidence of lumbar spine fracture. Alignment is normal. Intervertebral disc spaces are maintained. Mild endplate spurring at O3-5 and below. Atheromatous calcification of the aorta and iliacs. Generalized osteopenia IMPRESSION: No acute finding. Electronically Signed   By: Jorje Guild M.D.   On: 04/17/2021 04:29   CT Head Wo Contrast  Result Date: 04/17/2021 CLINICAL DATA:  Fall with neck pain EXAM: CT HEAD WITHOUT CONTRAST CT CERVICAL SPINE WITHOUT CONTRAST TECHNIQUE: Multidetector CT imaging of the head and cervical spine was performed following the standard protocol without intravenous contrast. Multiplanar CT image reconstructions of the cervical spine were also generated. COMPARISON:  02/12/2021 FINDINGS: CT HEAD FINDINGS Brain: No evidence of acute infarction, hemorrhage, hydrocephalus, extra-axial collection or mass lesion/mass effect. Chronic subcortical infarcts with ischemic gliosis in the white matter. Vascular: No hyperdense vessel or unexpected calcification. Skull: Normal. Negative for fracture or focal lesion. Sinuses/Orbits: No visible injury CT CERVICAL SPINE FINDINGS Alignment: No traumatic malalignment. Skull base and vertebrae: No acute fracture. No primary bone lesion or focal pathologic process. Soft tissues and spinal canal: No prevertebral fluid or swelling. No visible canal hematoma. Disc levels:  No significant degenerative changes Upper chest: Negative IMPRESSION: No evidence of intracranial  or cervical spine injury. Electronically Signed   By: Jorje Guild M.D.   On: 04/17/2021 04:08   CT Cervical Spine Wo Contrast  Result Date: 04/17/2021 CLINICAL DATA:  Fall with neck pain EXAM: CT HEAD WITHOUT CONTRAST CT CERVICAL SPINE WITHOUT CONTRAST TECHNIQUE: Multidetector CT imaging of the head and cervical spine was performed following the standard protocol without intravenous contrast. Multiplanar CT image reconstructions of the cervical spine were also generated. COMPARISON:  02/12/2021 FINDINGS: CT HEAD FINDINGS Brain: No evidence of acute infarction, hemorrhage, hydrocephalus, extra-axial collection or mass lesion/mass effect. Chronic subcortical infarcts with ischemic gliosis in the white matter. Vascular: No hyperdense vessel or unexpected calcification. Skull: Normal. Negative for fracture or focal lesion. Sinuses/Orbits: No visible injury CT CERVICAL SPINE FINDINGS Alignment: No traumatic malalignment. Skull base and vertebrae: No acute fracture. No primary bone lesion or focal pathologic process. Soft tissues and spinal canal: No prevertebral fluid or swelling. No visible canal hematoma. Disc levels:  No significant degenerative changes Upper chest: Negative IMPRESSION: No evidence of intracranial or cervical spine injury. Electronically Signed   By: Jorje Guild M.D.   On: 04/17/2021 04:08    Procedures Procedures   Medications Ordered in ED Medications  acetaminophen (TYLENOL) tablet 650 mg (has no administration in time range)    ED Course  I have reviewed the triage vital signs and the nursing notes.  Pertinent labs & imaging results that were available during my care of the patient were reviewed by me and considered in my medical decision making (see chart for details).    MDM Rules/Calculators/A&P  Patient with ESRD on HD here for evaluation following fall with head injury. She complains of pain throughout her entire spine but does not have  discrete bony tenderness when palpated. She is neurologically intact on evaluation. Imaging is negative for acute fracture or intracranial abnormality. Patient with recent CBC performed on October 7 with platelets of 132, hemoglobin of 10.8. No reports of bleeding prior to ED presentation. She did receive a full dialysis session yesterday. Discussed with patient findings of studies, no evidence of acute fracture. Low index of suspicion for retroperitoneal hematoma. Discussed with patient home care for pain with acetaminophen according to label instructions with outpatient follow-up. Study results were also discussed with patient's sister over the phone. Plan to discharge back to facility for ongoing care.  Final Clinical Impression(s) / ED Diagnoses Final diagnoses:  Fall, initial encounter  Contusion of scalp, initial encounter    Rx / DC Orders ED Discharge Orders     None        Quintella Reichert, MD 04/17/21 [phone]

## 2021-04-24 ENCOUNTER — Encounter: Payer: Self-pay | Admitting: Adult Health

## 2021-04-24 ENCOUNTER — Ambulatory Visit (INDEPENDENT_AMBULATORY_CARE_PROVIDER_SITE_OTHER): Payer: Medicare Other | Admitting: Adult Health

## 2021-04-24 VITALS — BP 157/86 | HR 78 | Ht <= 58 in

## 2021-04-24 DIAGNOSIS — R296 Repeated falls: Secondary | ICD-10-CM

## 2021-04-24 DIAGNOSIS — R404 Transient alteration of awareness: Secondary | ICD-10-CM

## 2021-04-24 NOTE — Patient Instructions (Signed)
Your Plan:  No changes today - no further recommendations from a neurology standpoint   Recurrent falls and passing out (as pt reported) low suspicion neurologically related    Follow up as needed     Thank you for coming to see [name] at [facility]. I hope we have been able to provide you high quality care today.  You may receive a patient satisfaction survey over the next few weeks. We would appreciate your feedback and comments so that we may continue to improve ourselves and the health of our patients.

## 2021-04-24 NOTE — Progress Notes (Signed)
[facility] [address] [place]. [place] [address] [phone]       OFFICE FOLLOW UP VISIT NOTE  Ms. Denzil Hughes Date of Birth:  1960-07-14 Medical Record Number:  [id number]   Referring MD: Fanny Skates Reason for Referral: TIA  Chief Complaint  Patient presents with   Follow-up    Rm 2 with facility staff Latasia  Pt has been having a lot of falls and ED visit, almost one every week or twice a week. She passes out a lot.      HPI:   Initial visit 08/01/2020 :Ms. Fern is a 60 year old Caucasian lady seen today for initial office consultation visit.  She is accompanied by her CNA from [facility] skilled nursing facility where she lives.  History is obtained from her and review of electronic medical records and personally reviewed pertinent imaging films in PACS.HAIDYNN ALMENDAREZ is an 60 y.o. female with ESRD on HD, depression, HLD, HTN, osteoporosis, left frontal subcortical stroke, TIA, tobacco abuse, DM2 and vitamin D deficiency, who presents acutely via EMS from her hemodialysis center for acute onset of AMS. She was reportedly at her normal baseline when she arrived at the dialysis center. During dialysis she began to exhibit somnolence and slurred speech. The dialysis session was continued for another hour and then EMS was called. On arrival she was lethargic with dysarthria and disorientation - she was unable to state where she was and also was disoriented to time. No focal weakness was noted by EMS. She did not exhibit any seizure-like activity per EMS. On arrival to the ED she continued to exhibit AMS and was lethargic/somnolent.  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  Patient was started on aspirin and a statin.  Patient states that since then she has had frequent falls.  Most of  the falls are related to change in body position particularly when she tried to get out of bed.  She was seen in the ER on 06/28/2020 for 1 such fall when she had a scalp hematoma and CT scan of the head showed no acute abnormality.  Patient does have longstanding diabetes and likely diabetic neuropathy.  She complains of tingling numbness paresthesias paresthesias in the feet all the time and at times she cannot sleep because of this.  She has not tried medications like gabapentin or Topamax yet.  She is on dialysis for last 2 years.  She has been living in a nursing home.  She is walking with a walker and has to be careful but still has had a few falls.  She states her falls are sudden she has no warning.  She does not lose consciousness.  There is no witnessed seizure-like activity noted.  Update 01/17/2021: Patient is seen for follow-up today after hospital admission in April 2022 for episode of confusion and fall.  Patient is unable to tell me what happened.  Review of electronic medical records show that she was admitted with confusion of unclear clear etiology and a fall.  Head CT scan showed right posterior auricular soft tissue edema but no acute abnormality.  She had similar prior admissions which complicated by altered mental status felt to be related to medication and she had been on Maxidone oxycodone which was discontinued.  Another admission she was on  gabapentin and Topamax which were discontinued and felt to be causing confusion.  She had EEG during the admission which showed mild diffuse slowing consistent with encephalopathy but no epileptiform activity.  She was found to have subclinical hypothyroidism with elevated TSH of 13 and she was started on Synthroid.  Patient complains of feeling imbalance and uses a wheeled walker.  She states she has some neck pain and muscle tightness but denies true radicular pain.   Update 04/24/2021 JM: Returns for 64-month follow-up accompanied by facility staff.   She has had multiple ED evals/admissions since prior visit with mostly consisting of recurrent falls, altered mental status (in setting of acute toxic encephalopathy from UTI or hypoxia) and random pain complaints.  Multiple repeat CTH and spinal imaging unremarkable for acute concerns.  She does has multiple comorbidities including ESRD on HD with chronic mild to moderate abdominal ascites, acute on chronic diastolic CHF, anemia, chronic thrombocytopenia, severe protein calorie malnutrition, hx of stroke, HTN, HLD, DM, continued tobacco and use of 2-3L O2 via [place].   Remains at [facility] She has not had any additional falls in the past week Use of RW for short distance and w/c for long distance Does follow with nephology with continued HD sessions T/Th/Sat Reports use of O2 as needed during the day and at night - today, sating at 95-98% on RA Continued tobacco use - reports 8 cigarettes per day Her main complaint today is rectal pain (seen in ED 10/7 for similar complaint) - chronic issue.  Prescribed lidocaine cream with improvement of symptoms. She had difficulty sitting still in w/c during visit due to pain. She is followed by GI routinely.  No further concerns at this time      ROS:   14 system review of systems is positive for those listed in HPI and all other systems negative  PMH:  Past Medical History:  Diagnosis Date   Ambulates with cane    Constipation    Depression    ESRD (end stage renal disease) ([place])    a. TTS Dialysis.   GERD (gastroesophageal reflux disease)    History of stress test    a. 01/2003 MV: EF 74%, no ischemia/infarct.   Hyperlipidemia    Hypertension    Osteoporosis    Stroke [name]) 04-01-11   left frontal subcortical, saw Dr. [name]    Syncope 11/2019   TIA (transient ischemic attack) 03-12-11   Tobacco abuse    Tricuspid regurgitation    a. 05/2016 Echo: EF 65-70%, Gr2DD, mild MR, nl RV fxn, Triv TR, PASP 70mmHg; b. 05/2018 Echo: EF 60-65%,  Gr2DD, mild MR/TR, RVSP/PASP 4mmHg; c. 11/2019 Echo: EF 55-60%, no rwma, mild-mod MR, Sev TR w/ RV dilatation (CTA chest neg for PE).   Type II diabetes mellitus (HCC)    Vitamin D deficiency     Social History:  Social History   Socioeconomic History   Marital status: Single    Spouse name: Not on file   Number of children: Not on file   Years of education: Not on file   Highest education level: Not on file  Occupational History   Not on file  Tobacco Use   Smoking status: Every Day    Packs/day: 0.30    Years: 32.00    Pack years: 9.60    Types: Cigarettes    Last attempt to quit: 09/23/2012    Years since quitting: 8.5    Passive exposure: Current   Smokeless tobacco:  Never   Tobacco comments:    8 cigarettes/day  Vaping Use   Vaping Use: Never used  Substance and Sexual Activity   Alcohol use: No   Drug use: No   Sexual activity: Not Currently    Birth control/protection: Post-menopausal  Other Topics Concern   Not on file  Social History Narrative   Lives at [facility] in [place], Skilled    Right Handed   Drinks 2-4 cups caffeine daily      Social Determinants of Health   Financial Resource Strain: Not on file  Food Insecurity: Not on file  Transportation Needs: Not on file  Physical Activity: Not on file  Stress: Not on file  Social Connections: Not on file  Intimate Partner Violence: Not on file    Medications:   Current Outpatient Medications on File Prior to Visit  Medication Sig Dispense Refill   Amino Acids-Protein Hydrolys (FEEDING SUPPLEMENT, PRO-STAT SUGAR FREE 64,) LIQD Take 30 mLs by mouth in the morning. WILD CHERRY FLAVOR     aspirin 81 MG chewable tablet Chew 81 mg by mouth daily.     atorvastatin (LIPITOR) 40 MG tablet Take 40 mg by mouth at bedtime.      b complex-vitamin c-folic acid (NEPHRO-VITE) 0.8 MG TABS tablet Take 1 tablet by mouth at bedtime.     carvedilol (COREG) 6.25 MG tablet Take 1 tablet (6.25 mg total) by mouth 2  (two) times daily with a meal.     cloNIDine (CATAPRES) 0.1 MG tablet Take 0.1 mg by mouth every 8 (eight) hours as needed (BP>160/90).     esomeprazole (NEXIUM) 40 MG capsule Take 40 mg by mouth daily.     FLUoxetine (PROZAC) 20 MG capsule Take 20 mg by mouth daily.     levothyroxine (SYNTHROID) 75 MCG tablet Take 1 tablet (75 mcg total) by mouth daily at 6 (six) AM. 30 tablet 0   Lido-PE-Glycerin-Petrolatum (PREPARATION H RAPID RELIEF EX) Apply topically every 6 (six) hours as needed (hemorrhoid).     lidocaine (L-M-X 4) 4 % cream Apply 1 application topically as needed. Apply to affected rectal area prior to dialysis. 30 g 0   midodrine (PROAMATINE) 10 MG tablet Take 10 mg by mouth See admin instructions. Take 1 tablet (10 mg) by mouth prior to dialysis on Tuesday, Thursday, Saturday     Nutritional Supplements (NOVASOURCE RENAL) LIQD Take 237 mLs by mouth with breakfast, with lunch, and with evening meal.     omega-3 acid ethyl esters (LOVAZA) 1 g capsule Take 2 capsules by mouth 2 (two) times daily.     ondansetron (ZOFRAN) 4 MG tablet Take 4 mg by mouth every 8 (eight) hours as needed for nausea or vomiting.     polyethylene glycol powder (GLYCOLAX/MIRALAX) 17 GM/SCOOP powder Take 17 g by mouth See admin instructions. Mix 17 grams of powder into 8 ounces of fluid, stir, and drink (by mouth) once a day     SACCHAROMYCES BOULARDII PO Take 1 capsule by mouth 2 (two) times daily.     senna-docusate (SENOKOT-S) 8.6-50 MG tablet Take 1 tablet by mouth at bedtime as needed for mild constipation. 30 tablet 0   sodium bicarbonate 650 MG tablet Take 1,300 mg by mouth 2 (two) times daily.     No current facility-administered medications on file prior to visit.    Allergies:   Allergies  Allergen Reactions   Hydrocodone Nausea And Vomiting and Other (See Comments)    "Allergic," per [facility]  Physical Exam Today's Vitals   04/24/21 1301  BP: (!) 157/86  Pulse: 78  Height: 4\' 6"  (1.372 m)    Body mass index is 20.25 kg/m.  General: Frail petite malnourished looking middle-aged Caucasian lady, seated, in no evident distress Head: head normocephalic and atraumatic.   Neck: supple with no carotid or supraclavicular bruits Cardiovascular: regular rate and rhythm, soft ejection systolic murmur.   Musculoskeletal: Mild kyphoscoliosis  skin:  no rash/petichiae Vascular:  Normal pulses all extremities Abdomen protuberant with distention  Neurologic Exam Mental Status: Awake and fully alert. Oriented to place, time and self. Recent and remote memory intact. Attention span, concentration and fund of knowledge appropriate. Mood and affect appropriate.   Cranial Nerves: Right pupil 3 mm reactive left 2 mm not reactive.  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  Extraocular movements full without nystagmus. Visual fields full to confrontation. Hearing intact. Facial sensation intact. Face, tongue, palate moves normally and symmetrically.  Motor: Normal bulk and tone. Normal strength in all tested extremity muscles except mild symmetric weakness of hip flexors and ankle dorsiflexors bilaterally.. Sensory.: intact to touch , pinprick , but mildly impaired position and vibratory sensation in both feet from ankle down..  Coordination: Rapid alternating movements normal in all extremities. Finger-to-nose and heel-to-shin performed accurately bilaterally. Gait and Station: Deferred as RW not present at visit Reflexes: 3+ brisk and symmetric in upper extremities and slightly depressed in both lower extremities.. Toes downgoing.      ASSESSMENT: 60 year old Caucasian lady with recurrent transient episodes of altered mental status of unclear etiology during dialysis unclear whether related to fluid shift, hypotension or possibly TIA versus seizure in October 2021 and 01/25/2021 in setting of acute toxic encephalopathy due to UTI, 02/18/2021 in setting of acute toxic  encephalopathy and acute on chronic hypoxic respiratory failure in setting of fluid overload from missed HD session and 10/18 with blood gas showing evidence of hypoxia with elevated bicarb and elevated TSH.  Recurrent falls likely multifactorial with multiple comorbidities.       PLAN:  Recurrent multiple falls and AMS likely multifactorial with multiple contributing comorbidities and poor general health with poor prognosis. No further evaluation or recommendations from neurological standpoint. Advised to use RW at all times for fall prevention and use of w/c for long distance. Advised to continue to follow with SNF or GI regarding rectal pain complaints  Follow up on an as needed basis      CC:  Seward Carol, MD   I spent 34 minutes of face-to-face and non-face-to-face time with patient and SNF CNA.  This included previsit chart review including review of multiple recent hospitalizations, lab review, study review, electronic health record documentation, patient and CNA education and discussion regarding multiple recurrent falls and altered mental status and likely contributing factors, rectal pain concerns and answered all questions to patient's satisfaction  Frann Rider, AGNP-BC  [facility] [address] [place], [place] [address]  Phone [phone] Fax [phone] Note: This document was prepared with digital dictation and possible smart phrase technology. Any transcriptional errors that result from this process are unintentional.

## 2021-06-13 ENCOUNTER — Encounter (HOSPITAL_COMMUNITY): Payer: Self-pay

## 2021-06-13 ENCOUNTER — Emergency Department (HOSPITAL_COMMUNITY)
Admission: EM | Admit: 2021-06-13 | Discharge: 2021-06-14 | Disposition: A | Discharge status: Skilled Nursing Facility | Payer: Medicare Other | Attending: Student | Admitting: Student

## 2021-06-13 ENCOUNTER — Other Ambulatory Visit: Payer: Self-pay

## 2021-06-13 DIAGNOSIS — Y840 Cardiac catheterization as the cause of abnormal reaction of the patient, or of later complication, without mention of misadventure at the time of the procedure: Secondary | ICD-10-CM | POA: Diagnosis not present

## 2021-06-13 DIAGNOSIS — I5032 Chronic diastolic (congestive) heart failure: Secondary | ICD-10-CM | POA: Insufficient documentation

## 2021-06-13 DIAGNOSIS — T82838A Hemorrhage of vascular prosthetic devices, implants and grafts, initial encounter: Secondary | ICD-10-CM | POA: Diagnosis not present

## 2021-06-13 DIAGNOSIS — I132 Hypertensive heart and chronic kidney disease with heart failure and with stage 5 chronic kidney disease, or end stage renal disease: Secondary | ICD-10-CM | POA: Insufficient documentation

## 2021-06-13 DIAGNOSIS — Z79899 Other long term (current) drug therapy: Secondary | ICD-10-CM | POA: Diagnosis not present

## 2021-06-13 DIAGNOSIS — R531 Weakness: Secondary | ICD-10-CM | POA: Diagnosis present

## 2021-06-13 DIAGNOSIS — F17210 Nicotine dependence, cigarettes, uncomplicated: Secondary | ICD-10-CM | POA: Insufficient documentation

## 2021-06-13 DIAGNOSIS — Z7982 Long term (current) use of aspirin: Secondary | ICD-10-CM | POA: Diagnosis not present

## 2021-06-13 DIAGNOSIS — IMO0001 Reserved for inherently not codable concepts without codable children: Secondary | ICD-10-CM

## 2021-06-13 DIAGNOSIS — Z992 Dependence on renal dialysis: Secondary | ICD-10-CM | POA: Insufficient documentation

## 2021-06-13 DIAGNOSIS — N186 End stage renal disease: Secondary | ICD-10-CM | POA: Diagnosis not present

## 2021-06-13 DIAGNOSIS — E1122 Type 2 diabetes mellitus with diabetic chronic kidney disease: Secondary | ICD-10-CM | POA: Diagnosis not present

## 2021-06-13 NOTE — ED Triage Notes (Signed)
Pt was sitting at facility and she noticed that her av fistula had started bleeding, Bleeding was controlled by ems prior to arrival at ED

## 2021-06-13 NOTE — ED Provider Notes (Signed)
[facility] EMERGENCY DEPARTMENT Provider Note   CSN: [id number] Arrival date & time: 06/13/21  1831     History Chief Complaint  Patient presents with   Weakness    [name] Michael is a 60 y.o. female with PMH ESRD on dialysis Tuesday Thursday Saturday, HLD, HTN, previous CVA who presents the emergency department for evaluation of an AV fistula bleed.  Patient was at dialysis was ultimately sent back to her facility.  While at her facility, she was found to have a slow persistent ooze from the dialysis fistula.  Patient arrives with bleeding controlled by EMS.  She denies chest pain, shortness of breath, Donnell pain, nausea, vomiting or other systemic symptoms.   Weakness Associated symptoms: no abdominal pain, no arthralgias, no chest pain, no cough, no dysuria, no fever, no seizures, no shortness of breath and no vomiting       Past Medical History:  Diagnosis Date   Ambulates with cane    Constipation    Depression    ESRD (end stage renal disease) ([place])    a. TTS Dialysis.   GERD (gastroesophageal reflux disease)    History of stress test    a. 01/2003 MV: EF 74%, no ischemia/infarct.   Hyperlipidemia    Hypertension    Osteoporosis    Stroke [facility]) 04-01-11   left frontal subcortical, saw Dr. [name]    Syncope 11/2019   TIA (transient ischemic attack) 03-12-11   Tobacco abuse    Tricuspid regurgitation    a. 05/2016 Echo: EF 65-70%, Gr2DD, mild MR, nl RV fxn, Triv TR, PASP 78mmHg; b. 05/2018 Echo: EF 60-65%, Gr2DD, mild MR/TR, RVSP/PASP 34mmHg; c. 11/2019 Echo: EF 55-60%, no rwma, mild-mod MR, Sev TR w/ RV dilatation (CTA chest neg for PE).   Type II diabetes mellitus ([place])    Vitamin D deficiency     Patient Active Problem List   Diagnosis Date Noted   Acute on chronic respiratory failure with hypoxia ([place]) 02/18/2021   Pressure injury of skin 02/17/2021   Abdominal ascites 01/25/2021   Lymphocytopenia 10/13/2020   Ear hematoma, right, initial  encounter 10/13/2020   Prolonged QT interval 10/13/2020   Lactic acidosis 10/05/2020   Transaminitis 10/05/2020   Thrombocytopenia ([place]) 10/05/2020   Acute respiratory failure with hypoxia ([place]) 10/05/2020   Altered mental status 66/44/0347   Acute metabolic encephalopathy 42/59/5638   Severe tricuspid regurgitation 12/04/2019   ESRD (end stage renal disease) ([place])    GERD (gastroesophageal reflux disease)    Hypertension    Type II diabetes mellitus ([place])    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   AMS (altered mental status) 04/28/2019   Hypoxemia 04/28/2019   Fall 06/09/2018   Fall at home, initial encounter 06/09/2018   Anemia of chronic disease 06/09/2018   Syncope and collapse 06/09/2018   Acute encephalopathy 03/10/2018   Hypermagnesemia 03/10/2018   Acute lower UTI 11/09/2016   Uncontrolled type 2 diabetes mellitus with complication    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 associated with diabetes mellitus due to underlying condition ([place])    Chronic diastolic CHF (congestive heart failure) ([place])    Hypokalemia 06/11/2016   Proliferative diabetic retinopathy ([place]) 04/29/2016   Poor social situation 03/09/2013   Depression 03/01/2013   Abnormal mammogram 12/20/2012   Retinal detachment 11/17/2012   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 ([place]) 75/64/3329   Hyperlipidemia 02/09/2007   Essential hypertension 02/09/2007    Past Surgical History:  Procedure Laterality Date   AV FISTULA PLACEMENT Left 02/21/2019   Procedure: BRACHIOCEPHALIC ARTERIOVENOUS (AV) FISTULA CREATION;  Surgeon: [name], MD;  Location: [place];  Service: Vascular;  Laterality: Left;   [place] Left 04/11/2019   Procedure: SECOND STAGE BASILIC VEIN TRANSPOSITION LEFT ARM;  Surgeon: [name], MD;  Location: [place];  Service: Vascular;  Laterality: Left;   IR FLUORO GUIDE CV LINE RIGHT  04/29/2019   IR PARACENTESIS   02/20/2021   IR US GUIDE VASC ACCESS RIGHT  04/29/2019   OPEN REDUCTION INTERNAL FIXATION (ORIF) DISTAL RADIAL FRACTURE Left 01/28/2016   Procedure: OPEN REDUCTION INTERNAL FIXATION (ORIF) DISTAL RADIAL FRACTURE;  Surgeon: Iran Planas, MD;  Location: [place];  Service: Orthopedics;  Laterality: Left;   [place]   RIGHT HEART CATH N/A 12/05/2019   Procedure: RIGHT HEART CATH;  Surgeon: Nelva Bush, MD;  Location: [place] CV LAB;  Service: Cardiovascular;  Laterality: N/A;   TONSILLECTOMY AND ADENOIDECTOMY     age 60     OB History   No obstetric history on file.     Family History  Problem Relation Age of Onset   Stroke Mother    Diabetes Mother    Kidney failure Mother    Heart failure Mother    Stroke Father    Cancer Sister        Breast- 74's   Colon cancer Maternal Grandmother    Pancreatic cancer Neg Hx    Esophageal cancer Neg Hx    Liver disease Neg Hx    Stomach cancer Neg Hx     Social History   Tobacco Use   Smoking status: Every Day    Packs/day: 0.30    Years: 32.00    Pack years: 9.60    Types: Cigarettes    Last attempt to quit: 09/23/2012    Years since quitting: 8.7    Passive exposure: Current   Smokeless tobacco: Never   Tobacco comments:    8 cigarettes/day  Vaping Use   Vaping Use: Never used  Substance Use Topics   Alcohol use: No   Drug use: No    Home Medications Prior to Admission medications   Medication Sig Start Date End Date Taking? Authorizing Provider  Amino Acids-Protein Hydrolys (FEEDING SUPPLEMENT, PRO-STAT SUGAR FREE 64,) LIQD Take 30 mLs by mouth in the morning. WILD CHERRY FLAVOR    [provider]  aspirin 81 MG chewable tablet Chew 81 mg by mouth daily.    [provider]  atorvastatin (LIPITOR) 40 MG tablet Take 40 mg by mouth at bedtime.     [provider]  b complex-vitamin c-folic acid (NEPHRO-VITE) 0.8 MG TABS tablet Take 1 tablet by mouth at bedtime.     [provider]  carvedilol (COREG) 6.25 MG tablet Take 1 tablet (6.25 mg total) by mouth 2 (two) times daily with a meal. 02/22/21   Thurnell Lose, MD  cloNIDine (CATAPRES) 0.1 MG tablet Take 0.1 mg by mouth every 8 (eight) hours as needed (BP>160/90).    [provider]  esomeprazole (NEXIUM) 40 MG capsule Take 40 mg by mouth daily.    [provider]  FLUoxetine (PROZAC) 20 MG capsule Take 20 mg by mouth daily. 12/21/20   [provider]  levothyroxine (SYNTHROID) 75 MCG tablet Take 1 tablet (75 mcg total) by mouth daily at 6 (six) AM. 02/02/21   Regalado, [employer]  A, MD  Lido-PE-Glycerin-Petrolatum (PREPARATION H RAPID RELIEF EX) Apply topically every 6 (six) hours as needed (hemorrhoid).    [provider]  lidocaine (L-M-X 4) 4 % cream Apply 1 application topically as needed. Apply to affected rectal area prior to dialysis. 03/29/21   Luna Fuse, MD  midodrine (PROAMATINE) 10 MG tablet Take 10 mg by mouth See admin instructions. Take 1 tablet (10 mg) by mouth prior to dialysis on Tuesday, Thursday, Saturday    [provider]  Nutritional Supplements (NOVASOURCE RENAL) LIQD Take 237 mLs by mouth with breakfast, with lunch, and with evening meal.    [provider]  omega-3 acid ethyl esters (LOVAZA) 1 g capsule Take 2 capsules by mouth 2 (two) times daily. 05/28/20   [provider]  ondansetron (ZOFRAN) 4 MG tablet Take 4 mg by mouth every 8 (eight) hours as needed for nausea or vomiting.    [provider]  polyethylene glycol powder (GLYCOLAX/MIRALAX) 17 GM/SCOOP powder Take 17 g by mouth See admin instructions. Mix 17 grams of powder into 8 ounces of fluid, stir, and drink (by mouth) once a day    [provider]  SACCHAROMYCES BOULARDII PO Take 1 capsule by mouth 2 (two) times daily.    [provider]  senna-docusate (SENOKOT-S) 8.6-50 MG tablet Take 1 tablet by mouth at bedtime as needed for  mild constipation. 06/11/18   Regalado, [name], MD  sodium bicarbonate 650 MG tablet Take 1,300 mg by mouth 2 (two) times daily.    [provider]    Allergies    Hydrocodone  Review of Systems   Review of Systems  Constitutional:  Negative for chills and fever.  HENT:  Negative for ear pain and sore throat.   Eyes:  Negative for pain and visual disturbance.  Respiratory:  Negative for cough and shortness of breath.   Cardiovascular:  Negative for chest pain and palpitations.  Gastrointestinal:  Negative for abdominal pain and vomiting.  Genitourinary:  Negative for dysuria and hematuria.  Musculoskeletal:  Negative for arthralgias and back pain.  Skin:  Negative for color change and rash.  Neurological:  Negative for seizures, syncope and weakness.  All other systems reviewed and are negative.  Physical Exam Updated Vital Signs BP (!) 164/70    Pulse 66    Temp 98 F (36.7 C) (Oral)    Resp 17    Ht 4\' 6"  (1.372 m)    Wt 39.9 kg    LMP 03/06/2013 (LMP Unknown)    SpO2 99%    BMI 21.22 kg/m   Physical Exam Vitals and nursing note reviewed.  Constitutional:      General: She is not in acute distress.    Appearance: She is well-developed.  HENT:     Head: Normocephalic and atraumatic.  Eyes:     Conjunctiva/sclera: Conjunctivae normal.  Cardiovascular:     Rate and Rhythm: Normal rate and regular rhythm.     Heart sounds: No murmur heard. Pulmonary:     Effort: Pulmonary effort is normal. No respiratory distress.     Breath sounds: Normal breath sounds.  Abdominal:     Palpations: Abdomen is soft.     Tenderness: There is no abdominal tenderness.  Musculoskeletal:        General: No swelling.     Cervical back: Neck supple.     Comments: Persistent bleed from small puncture wound at the proximal edge of her left AV fistula  Skin:    General: Skin is warm and dry.     Capillary Refill: Capillary refill takes less than 2 seconds.  Neurological:     Mental  Status: She is alert.  Psychiatric:        Mood and Affect: Mood normal.    ED Results / Procedures / Treatments   Labs (all labs ordered are listed, but only abnormal results are displayed) Labs Reviewed - No data to display  EKG None  Radiology No results found.  Procedures Procedures   Medications Ordered in ED Medications - No data to display  ED Course  I have reviewed the triage vital signs and the nursing notes.  Pertinent labs & imaging results that were available during my care of the patient were reviewed by me and considered in my medical decision making (see chart for details).    MDM Rules/Calculators/A&P                          Patient seen emergency department for evaluation of a bleeding dialysis fistula.  Physical exam reveals a small persistent bleed at a site of a small puncture wound at the proximal edge of her left AV dialysis fistula.  Direct pressure was applied and a combat gauze was placed at the site and wrapped in [place].  Wound reevaluated after 30 minutes and the bleeding has stopped.  She is safe for discharge at this time, and patient discharged back to her facility.   Final Clinical Impression(s) / ED Diagnoses Final diagnoses:  Bleeding pseudoaneurysm of left brachiocephalic AV fistula, initial encounter [facility])    Rx / [place] Orders ED Discharge Orders     None        Rama Mcclintock, Debe Coder, MD 06/13/21 [phone]

## 2021-06-13 NOTE — Discharge Instructions (Signed)
You are seen in the emergency department for evaluation of a small bleed from the AV fistula site.  The bleeding was controlled using combat gauze and pressure dressing.  The bleeding was controlled in the emergency department and you are safe for discharge at this time.  Please return the emergency department if the bleeding returns, you have severe fatigue, you pass out, or you have any other concerning symptoms.  At this time you are safe for discharge.

## 2021-06-14 NOTE — ED Notes (Signed)
[facility] transport to pick up at 4am

## 2021-07-26 ENCOUNTER — Other Ambulatory Visit: Payer: Self-pay

## 2021-07-26 ENCOUNTER — Non-Acute Institutional Stay: Payer: Medicare Other | Admitting: Hospice

## 2021-07-26 DIAGNOSIS — R634 Abnormal weight loss: Secondary | ICD-10-CM

## 2021-07-26 DIAGNOSIS — N186 End stage renal disease: Secondary | ICD-10-CM

## 2021-07-26 DIAGNOSIS — R269 Unspecified abnormalities of gait and mobility: Secondary | ICD-10-CM

## 2021-07-26 DIAGNOSIS — Z992 Dependence on renal dialysis: Secondary | ICD-10-CM

## 2021-07-26 DIAGNOSIS — Z515 Encounter for palliative care: Secondary | ICD-10-CM

## 2021-07-26 NOTE — Progress Notes (Signed)
[facility] Consult Note Telephone: [phone]  Fax: [phone]  PATIENT NAME: [name] Michael DOB: 11-Aug-1960 MRN: [id number]  PRIMARY CARE PROVIDER: Dr Felicita Gage Mammie Lorenzo NP  REFERRING PROVIDER:   Dr Felicita Gage Mammie Lorenzo NP    RESPONSIBLE PARTY: Self   Extended Emergency Contact Information Primary Emergency Contact: Rosalio Macadamia [place] Phone: [phone] Relation: Sister Secondary Emergency Contact: [place] Phone: [phone] Mobile Phone: [phone] Relation: Niece   CHIEF COMPLAIN: Follow up visit/Gait disturbance   Visit is to build trust and highlight Palliative Medicine as specialized medical care for people living with serious illness, aimed at facilitating better quality of life through symptoms relief, assisting with advance care plan and establishing goals of care.    RECOMMENDATIONS/PLAN:    Advance Care Planning/CODE STATUS: Patient affirmed she is a DO NOT RESUSCITATE.   GOALS OF CARE: Goals of care include to maximize quality of life and symptom management. She said she does not want hospice but will continue with dialysis as planned.     Symptom Management/Plan:  Gait disturbance: resulting in a recent fall.  PT OT is ongoing for strengthening and gait training. . Balance of rest and performance activity.  Encourage use of assistive device for support to help prevent.  Fall precautions in place End-stage renal disease: Continue dialysis Tue Thur Sat as scheduled.  Continue Renvela and renal diet. Adhere to 1200 cc fluid restriction Weight loss: Current weight is 77 pounds down from 80 pounds December 2022 and 92 pounds August 2022.  Patient/nursing reports appetite is increased improving.  Continue nova resource renal nutritional drink.  Offer assistance as needed during meals to ensure adequate oral intake.  Weekly weight check, and reports weight loss   Recommendation: Initiate mirtazapine 7.5 mg at bedtime to help boost appetite Constipation: Miralax daily. Palliative will continue to monitor for symptom management/decline and make recommendations as needed. Return 6 weeks or prn. Encouraged to call provider sooner with any concerns.    PPS: 50%   HOSPICE ELIGIBILITY/DIAGNOSIS: TBD   HISTORY OF PRESENT ILLNESS:  [name] Michael is a 61 y.o. female with multiple medical problems including gait disturbance for which PT/OT is ongoing and helpful. History  of ESRD on dialysis Tuesday Thursday and Saturday, CVA, CHF.  Patient denies pain/discomfort, no bleeding from fistula site.  Nursing with no concerns today.  History obtained from review of EMR, History obtained from review of EMR, discussion with primary team, family and/or patient. Records reviewed and summarized above. All 10 point systems reviewed and are negative except as documented in history of present illness above  Review and summarization of Epic records shows history from other than patient.   Palliative Care was asked to help address complex decision making in the context of advance care planning and goals of care clarification.     ROS  General: NAD, appropriately dressed Constitution: Denies fever/chills EYES: denies vision changes ENMT: denies Xerostomia,  dysphagia Cardiovascular: denies chest pain Pulmonary: denies  cough, denies dyspnea  Abdomen: endorses fair appetite, endorses occasional nausea, denies constipation or diarrhea GU: denies dysuria MSK:  endorses ROM limitations, no falls reported Skin: denies rashes/bruising Neurological: endorses weakness, denies pain, denies insomnia Psych: Endorses positive mood Heme/lymph/immuno: denies bruises, no abnormal bleeding   PHYSICAL EXAM  General: In no acute distress, appropriately dressed Cardiovascular: regular rate and rhythm; no edema in BLE Pulmonary: no cough, no increased work of breathing, normal  respiratory effort Abdomen: soft,  non tender, no guarding, positive bowel sounds in all quadrants GU:  no suprapubic tenderness Eyes: Normal lids, no discharge, sclera anicteric ENMT: Moist mucous membranes Musculoskeletal:  weakness, unsteady gait Skin: no rash to visible skin, warm without cyanosis,  Psych: non-anxious affect Neurological: Weakness but otherwise non focal Heme/lymph/immuno: no bruises, no bleeding  PERTINENT MEDICATIONS:  Outpatient Encounter Medications as of 07/26/2021  Medication Sig   Amino Acids-Protein Hydrolys (FEEDING SUPPLEMENT, PRO-STAT SUGAR FREE 64,) LIQD Take 30 mLs by mouth in the morning. WILD CHERRY FLAVOR   aspirin 81 MG chewable tablet Chew 81 mg by mouth daily.   atorvastatin (LIPITOR) 40 MG tablet Take 40 mg by mouth at bedtime.    b complex-vitamin c-folic acid (NEPHRO-VITE) 0.8 MG TABS tablet Take 1 tablet by mouth at bedtime.   carvedilol (COREG) 6.25 MG tablet Take 1 tablet (6.25 mg total) by mouth 2 (two) times daily with a meal.   cloNIDine (CATAPRES) 0.1 MG tablet Take 0.1 mg by mouth every 8 (eight) hours as needed (BP>160/90).   esomeprazole (NEXIUM) 40 MG capsule Take 40 mg by mouth daily.   FLUoxetine (PROZAC) 20 MG capsule Take 20 mg by mouth daily.   levothyroxine (SYNTHROID) 75 MCG tablet Take 1 tablet (75 mcg total) by mouth daily at 6 (six) AM.   Lido-PE-Glycerin-Petrolatum (PREPARATION H RAPID RELIEF EX) Apply topically every 6 (six) hours as needed (hemorrhoid).   lidocaine (L-M-X 4) 4 % cream Apply 1 application topically as needed. Apply to affected rectal area prior to dialysis.   midodrine (PROAMATINE) 10 MG tablet Take 10 mg by mouth See admin instructions. Take 1 tablet (10 mg) by mouth prior to dialysis on Tuesday, Thursday, Saturday   Nutritional Supplements (NOVASOURCE RENAL) LIQD Take 237 mLs by mouth with breakfast, with lunch, and with evening meal.   omega-3 acid ethyl esters (LOVAZA) 1 g capsule Take 2 capsules by mouth 2  (two) times daily.   ondansetron (ZOFRAN) 4 MG tablet Take 4 mg by mouth every 8 (eight) hours as needed for nausea or vomiting.   polyethylene glycol powder (GLYCOLAX/MIRALAX) 17 GM/SCOOP powder Take 17 g by mouth See admin instructions. Mix 17 grams of powder into 8 ounces of fluid, stir, and drink (by mouth) once a day   SACCHAROMYCES BOULARDII PO Take 1 capsule by mouth 2 (two) times daily.   senna-docusate (SENOKOT-S) 8.6-50 MG tablet Take 1 tablet by mouth at bedtime as needed for mild constipation.   sodium bicarbonate 650 MG tablet Take 1,300 mg by mouth 2 (two) times daily.   No facility-administered encounter medications on file as of 07/26/2021.    HOSPICE ELIGIBILITY/DIAGNOSIS: TBD  PAST MEDICAL HISTORY:  Past Medical History:  Diagnosis Date   Ambulates with cane    Constipation    Depression    ESRD (end stage renal disease) ([place])    a. TTS Dialysis.   GERD (gastroesophageal reflux disease)    History of stress test    a. 01/2003 MV: EF 74%, no ischemia/infarct.   Hyperlipidemia    Hypertension    Osteoporosis    Stroke [facility]) 04-01-11   left frontal subcortical, saw Dr. [name]    Syncope 11/2019   TIA (transient ischemic attack) 03-12-11   Tobacco abuse    Tricuspid regurgitation    a. 05/2016 Echo: EF 65-70%, Gr2DD, mild MR, nl RV fxn, Triv TR, PASP 29mmHg; b. 05/2018 Echo: EF 60-65%, Gr2DD, mild MR/TR, RVSP/PASP 37mmHg; c. 11/2019 Echo: EF 55-60%, no rwma, mild-mod MR,  Sev TR w/ RV dilatation (CTA chest neg for PE).   Type II diabetes mellitus ([facility])    Vitamin D deficiency      SOCIAL HX: @SOCX  Patient lives at SNF for ongoing care  FAMILY HX:  Family History  Problem Relation Age of Onset   Stroke Mother    Diabetes Mother    Kidney failure Mother    Heart failure Mother    Stroke Father    Cancer Sister        Breast- 68's   Colon cancer Maternal Grandmother    Pancreatic cancer Neg Hx    Esophageal cancer Neg Hx    Liver disease Neg Hx    Stomach  cancer Neg Hx    ALLERGIES:  Allergies  Allergen Reactions   Hydrocodone Nausea And Vomiting and Other (See Comments)    "Allergic," per MAR      I spent 50  minutes providing this consultation; this includes time spent with patient/family, chart review and documentation. More than 50% of the time in this consultation was spent on counseling and coordinating communication   Thank you for the opportunity to participate in the care of [name] Michael Please call our office at [phone] if we can be of additional assistance.  Note: Portions of this note were generated with Lobbyist. Dictation errors may occur despite best attempts at proofreading.  Teodoro Spray, NP

## 2021-08-23 ENCOUNTER — Other Ambulatory Visit (HOSPITAL_COMMUNITY): Payer: Self-pay | Admitting: Nephrology

## 2021-08-23 ENCOUNTER — Other Ambulatory Visit: Payer: Self-pay | Admitting: Nephrology

## 2021-08-23 DIAGNOSIS — R188 Other ascites: Secondary | ICD-10-CM

## 2021-08-28 ENCOUNTER — Inpatient Hospital Stay (HOSPITAL_COMMUNITY): Admission: RE | Admit: 2021-08-28 | Payer: Medicare Other | Source: Ambulatory Visit

## 2021-08-28 ENCOUNTER — Encounter (HOSPITAL_COMMUNITY): Payer: Self-pay

## 2021-09-17 ENCOUNTER — Other Ambulatory Visit: Payer: Self-pay

## 2021-09-17 ENCOUNTER — Non-Acute Institutional Stay: Payer: Medicare Other | Admitting: Hospice

## 2021-09-17 DIAGNOSIS — R269 Unspecified abnormalities of gait and mobility: Secondary | ICD-10-CM

## 2021-09-17 DIAGNOSIS — N186 End stage renal disease: Secondary | ICD-10-CM

## 2021-09-17 DIAGNOSIS — Z515 Encounter for palliative care: Secondary | ICD-10-CM

## 2021-09-17 DIAGNOSIS — R634 Abnormal weight loss: Secondary | ICD-10-CM

## 2021-09-17 DIAGNOSIS — F339 Major depressive disorder, recurrent, unspecified: Secondary | ICD-10-CM

## 2021-09-17 NOTE — Progress Notes (Signed)
? ? ?Manufacturing engineer ?Community Palliative Care Consult Note ?Telephone: [phone]  ?Fax: [phone] ? ?PATIENT NAME: [name] Michael ?DOB: Apr 30, 1961 ?MRN: [id number] ? ?PRIMARY CARE PROVIDER: Dr Felicita Gage Mammie Lorenzo NP  ?REFERRING PROVIDER:   ?Dr Mellissa Kohut NP  ?  ?RESPONSIBLE PARTY: Self ?  Extended Emergency Contact Information ?Primary Emergency Contact: Perdue,[name] ? [place] ?Home Phone: [phone] ?Relation: Sister ?Secondary Emergency Contact: Prrish,Shannon ?Home Phone: [phone] ?Mobile Phone: [phone] ?Relation: Niece ? ?  ?Visit is to build trust and highlight Palliative Medicine as specialized medical care for people living with serious illness, aimed at facilitating better quality of life through symptoms relief, assisting with advance care plan and establishing goals of care.  ?  ?RECOMMENDATIONS/PLAN:  ?  ?Advance Care Planning/CODE STATUS: Patient affirmed she is a DO NOT RESUSCITATE.  ? GOALS OF CARE: Goals of care include to maximize quality of life and symptom management. She said she does not want hospice but will continue with dialysis as planned.   ?  ?Symptom Management/Plan:  ?End-stage renal disease: Dialysis is well tolerated. Continue dialysis Tue Thur Sat as scheduled.  Continue Renvela and renal diet. Adhere to 1200 cc fluid restriction ?Gait disturbance: Ambulatory with rolling walker. Fall precautions in place. PT OT is ongoing for strengthening and gait training. . Balance of rest and performance activity.   ?Weight loss: Current weight is 80 Ib, an increase from 77 Ib last month.  Patient/nursing reports appetite is increased improving.  Continue nova resource renal nutritional drink.  Offer assistance as needed during meals to ensure adequate oral intake.  ?Depression: Managed with Prozac.  ?Constipation: Miralax daily. ?Palliative will continue to monitor for symptom management/decline and make recommendations as  needed. Return 6 weeks or prn. Encouraged to call provider sooner with any concerns.  ?  ?PPS: 50% ?  ?HOSPICE ELIGIBILITY/DIAGNOSIS: TBD ? CHIEF COMPLAIN: Follow up visit ?HISTORY OF PRESENT ILLNESS:  [name] Michael is a 61 y.o. female with multiple morbidities requiring close monitoring/management with high risk of complications and morbidity: ESRD on dialysis Tuesday Thursday and Saturday, CVA, CHF, gait disturbance, depression. Patient denies pain/discomfort,.  Nursing with no concerns today.  History obtained from review of EMR, History obtained from review of EMR, discussion with primary team, family and/or patient. Records reviewed and summarized above. All 10 point systems reviewed and are negative except as documented in history of present illness above ? ?Review and summarization of Epic records shows history from other than patient.  ? ?Palliative Care was asked to help address complex decision making in the context of advance care planning and goals of care clarification.  ? ? ? ?ROS  ?General: NAD, appropriately dressed ?Constitution: Denies fever/chills ?EYES: denies vision changes ?ENMT: denies Xerostomia,  dysphagia ?Cardiovascular: denies chest pain ?Pulmonary: denies  cough, denies dyspnea  ?Abdomen: endorses good appetite, denies constipation or diarrhea ?GU: denies dysuria ?MSK:  endorses ROM limitations, no falls reported ?Skin: denies rashes/bruising ?Neurological: endorses weakness, denies pain, denies insomnia ?Psych: Endorses positive mood ?Heme/lymph/immuno: denies bruises, no abnormal bleeding ? ? ?PHYSICAL EXAM  ?General: In no acute distress, appropriately dressed ?Cardiovascular: regular rate and rhythm; no edema in BLE ?Pulmonary: no cough, no increased work of breathing, normal respiratory effort ?Abdomen: soft, non tender, no guarding, positive bowel sounds in all quadrants ?GU:  no suprapubic tenderness ?Eyes: Normal lids, no discharge, sclera anicteric ?ENMT: Moist mucous  membranes ?Musculoskeletal:  weakness, unsteady gait, ambulatory with rolling walker ?Skin: no rash to  visible skin, warm without cyanosis,  ?Psych: non-anxious affect ?Neurological: Weakness but otherwise non focal ?Heme/lymph/immuno: no bruises, no bleeding ? ?PERTINENT MEDICATIONS:  ?Outpatient Encounter Medications as of 09/17/2021  ?Medication Sig  ? Amino Acids-Protein Hydrolys (FEEDING SUPPLEMENT, PRO-STAT SUGAR FREE 64,) LIQD Take 30 mLs by mouth in the morning. WILD CHERRY FLAVOR  ? aspirin 81 MG chewable tablet Chew 81 mg by mouth daily.  ? atorvastatin (LIPITOR) 40 MG tablet Take 40 mg by mouth at bedtime.   ? b complex-vitamin c-folic acid (NEPHRO-VITE) 0.8 MG TABS tablet Take 1 tablet by mouth at bedtime.  ? carvedilol (COREG) 6.25 MG tablet Take 1 tablet (6.25 mg total) by mouth 2 (two) times daily with a meal.  ? cloNIDine (CATAPRES) 0.1 MG tablet Take 0.1 mg by mouth every 8 (eight) hours as needed (BP>160/90).  ? esomeprazole (NEXIUM) 40 MG capsule Take 40 mg by mouth daily.  ? FLUoxetine (PROZAC) 20 MG capsule Take 20 mg by mouth daily.  ? levothyroxine (SYNTHROID) 75 MCG tablet Take 1 tablet (75 mcg total) by mouth daily at 6 (six) AM.  ? Lido-PE-Glycerin-Petrolatum (PREPARATION H RAPID RELIEF EX) Apply topically every 6 (six) hours as needed (hemorrhoid).  ? lidocaine (L-M-X 4) 4 % cream Apply 1 application topically as needed. Apply to affected rectal area prior to dialysis.  ? midodrine (PROAMATINE) 10 MG tablet Take 10 mg by mouth See admin instructions. Take 1 tablet (10 mg) by mouth prior to dialysis on Tuesday, Thursday, Saturday  ? Nutritional Supplements (NOVASOURCE RENAL) LIQD Take 237 mLs by mouth with breakfast, with lunch, and with evening meal.  ? omega-3 acid ethyl esters (LOVAZA) 1 g capsule Take 2 capsules by mouth 2 (two) times daily.  ? ondansetron (ZOFRAN) 4 MG tablet Take 4 mg by mouth every 8 (eight) hours as needed for nausea or vomiting.  ? polyethylene glycol powder  (GLYCOLAX/MIRALAX) 17 GM/SCOOP powder Take 17 g by mouth See admin instructions. Mix 17 grams of powder into 8 ounces of fluid, stir, and drink (by mouth) once a day  ? SACCHAROMYCES BOULARDII PO Take 1 capsule by mouth 2 (two) times daily.  ? senna-docusate (SENOKOT-S) 8.6-50 MG tablet Take 1 tablet by mouth at bedtime as needed for mild constipation.  ? sodium bicarbonate 650 MG tablet Take 1,300 mg by mouth 2 (two) times daily.  ? ?No facility-administered encounter medications on file as of 09/17/2021.  ? ? ?HOSPICE ELIGIBILITY/DIAGNOSIS: TBD ? ?PAST MEDICAL HISTORY:  ?Past Medical History:  ?Diagnosis Date  ? Ambulates with cane   ? Constipation   ? Depression   ? ESRD (end stage renal disease) ([place])   ? a. TTS Dialysis.  ? GERD (gastroesophageal reflux disease)   ? History of stress test   ? a. 01/2003 MV: EF 74%, no ischemia/infarct.  ? Hyperlipidemia   ? Hypertension   ? Osteoporosis   ? Stroke [facility]) 04-01-11  ? left frontal subcortical, saw Dr. [name]   ? Syncope 11/2019  ? TIA (transient ischemic attack) 03-12-11  ? Tobacco abuse   ? Tricuspid regurgitation   ? a. 05/2016 Echo: EF 65-70%, Gr2DD, mild MR, nl RV fxn, Triv TR, PASP 30mmHg; b. 05/2018 Echo: EF 60-65%, Gr2DD, mild MR/TR, RVSP/PASP 75mmHg; c. 11/2019 Echo: EF 55-60%, no rwma, mild-mod MR, Sev TR w/ RV dilatation (CTA chest neg for PE).  ? Type II diabetes mellitus ([place])   ? Vitamin D deficiency   ?  ? ?SOCIAL HX: @SOCX  Patient lives at [facility]  for ongoing care ? ?FAMILY HX:  ?Family History  ?Problem Relation Age of Onset  ? Stroke Mother   ? Diabetes Mother   ? Kidney failure Mother   ? Heart failure Mother   ? Stroke Father   ? Cancer Sister   ?     Breast- 50's  ? Colon cancer Maternal Grandmother   ? Pancreatic cancer Neg Hx   ? Esophageal cancer Neg Hx   ? Liver disease Neg Hx   ? Stomach cancer Neg Hx   ? ?ALLERGIES:  ?Allergies  ?Allergen Reactions  ? Hydrocodone Nausea And Vomiting and Other (See Comments)  ?  "Allergic," per MAR  ?   ? ?I spent  45 minutes providing this consultation; this includes time spent with patient/family, chart review and documentation. More than 50% of the time in this consultation was spent on counseling and coordinating communication  ?

## 2021-10-22 ENCOUNTER — Non-Acute Institutional Stay: Payer: Medicare Other | Admitting: Hospice

## 2021-10-22 DIAGNOSIS — Z515 Encounter for palliative care: Secondary | ICD-10-CM

## 2021-10-22 DIAGNOSIS — R634 Abnormal weight loss: Secondary | ICD-10-CM

## 2021-10-22 DIAGNOSIS — R269 Unspecified abnormalities of gait and mobility: Secondary | ICD-10-CM

## 2021-10-22 DIAGNOSIS — F339 Major depressive disorder, recurrent, unspecified: Secondary | ICD-10-CM

## 2021-10-22 DIAGNOSIS — N186 End stage renal disease: Secondary | ICD-10-CM

## 2021-10-22 NOTE — Progress Notes (Signed)
? ? ?Manufacturing engineer ?Community Palliative Care Consult Note ?Telephone: [phone]  ?Fax: [phone] ? ?PATIENT NAME: [name] Michael ?DOB: 1960-12-30 ?MRN: [id number] ? ?PRIMARY CARE PROVIDER: Dr Felicita Gage [name] Lorenzo NP  ?REFERRING PROVIDER:   ?Dr Mellissa Kohut NP  ?  ?RESPONSIBLE PARTY: Self ?  Extended Emergency Contact Information ?Primary Emergency Contact: Perdue,[name] ? [place] ?Home Phone: [phone] ?Relation: Sister ?Secondary Emergency Contact: Prrish,Shannon ?Home Phone: [phone] ?Mobile Phone: [phone] ?Relation: Niece ? ?  ?Visit is to build trust and highlight Palliative Medicine as specialized medical care for people living with serious illness, aimed at facilitating better quality of life through symptoms relief, assisting with advance care plan and establishing goals of care.  ?  ?RECOMMENDATIONS/PLAN:  ?  ?Advance Care Planning/CODE STATUS: Patient affirmed she is a DO NOT RESUSCITATE.  ? GOALS OF CARE: Goals of care include to maximize quality of life and symptom management.  ?Symptom Management/Plan:  ?End-stage renal disease: Ongoing Tue Thur Sat, dialysis is well tolerated.  Continue Renvela and renal diet. Adhere to 1200 cc fluid restriction. ?Gait disturbance: Ambulatory with rolling walker. Fall precautions in place, patient is compliant. Balance of rest and performance activity.   ?Weight loss: Current weight is 76.7 Ib down from 80 Ib, 3 months ago. Set up/offer help during meals to ensure adequate oral intake. Continue nova resource renal nutritional drink. Routine CBC CMP ?Depression: Stable. Managed with Prozac.  ?Constipation: Stable. Miralax daily. ?Palliative will continue to monitor for symptom management/decline and make recommendations as needed. Return 6 weeks or prn. Encouraged to call provider sooner with any concerns.  ?  ?PPS: 50% ?  ?HOSPICE ELIGIBILITY/DIAGNOSIS: Patient is not interested in hospice service.   ? ?CHIEF COMPLAIN: Follow up visit ? ?HISTORY OF PRESENT ILLNESS:  [name] is a 61 y.o. female with multiple morbidities requiring close monitoring/management with high risk of complications and morbidity: ESRD on dialysis, CVA, CHF, gait disturbance, depression. Patient denies pain/discomfort.  Nursing reports occasional nausea managed with prn Zofran. No nausea in over a week. History obtained from review of EMR, History obtained from review of EMR, discussion with primary team, family and/or patient. Records reviewed and summarized above. All 10 point systems reviewed and are negative except as documented in history of present illness above ? ?Review and summarization of Epic records shows history from other than patient.  ? ?Palliative Care was asked to help address complex decision making in the context of advance care planning and goals of care clarification.  ? ? ? ?ROS  ?General: NAD, appropriately dressed ?Constitution: Denies fever/chills ?EYES: denies vision changes ?ENMT: denies Xerostomia,  dysphagia ?Cardiovascular: denies chest pain ?Pulmonary: denies  cough, denies dyspnea  ?Abdomen: endorses fair to good appetite, denies constipation or diarrhea ?MSK:  endorses ROM limitations, no falls reported ?Skin: denies rashes/bruising ?Neurological: endorses weakness, denies pain, denies insomnia ?Psych: Endorses positive mood ?Heme/lymph/immuno: denies bruises, no abnormal bleeding ? ? ?PHYSICAL EXAM  ?Height/Weight: 4 feet 9 inches/76.6 Ibs BMI 16.44 ?General: In no acute distress, appropriately dressed ?Cardiovascular: regular rate and rhythm; no edema in BLE ?Pulmonary: no cough, no increased work of breathing, normal respiratory effort ?Abdomen: soft, non tender, no guarding, positive bowel sounds in all quadrants ?GU:  no suprapubic tenderness ?Eyes: Normal lids, no discharge, sclera anicteric ?ENMT: Moist mucous membranes ?Musculoskeletal:  weakness, unsteady gait, ambulatory with rolling  walker ?Skin: no rash to visible skin, warm without cyanosis,  ?Psych: non-anxious affect ?Neurological: Weakness but otherwise non  focal ?Heme/lymph/immuno: no bruises, no bleeding ? ?PERTINENT MEDICATIONS:  ?Outpatient Encounter Medications as of 10/22/2021  ?Medication Sig  ? Amino Acids-Protein Hydrolys (FEEDING SUPPLEMENT, PRO-STAT SUGAR FREE 64,) LIQD Take 30 mLs by mouth in the morning. WILD CHERRY FLAVOR  ? aspirin 81 MG chewable tablet Chew 81 mg by mouth daily.  ? atorvastatin (LIPITOR) 40 MG tablet Take 40 mg by mouth at bedtime.   ? b complex-vitamin c-folic acid (NEPHRO-VITE) 0.8 MG TABS tablet Take 1 tablet by mouth at bedtime.  ? carvedilol (COREG) 6.25 MG tablet Take 1 tablet (6.25 mg total) by mouth 2 (two) times daily with a meal.  ? cloNIDine (CATAPRES) 0.1 MG tablet Take 0.1 mg by mouth every 8 (eight) hours as needed (BP>160/90).  ? esomeprazole (NEXIUM) 40 MG capsule Take 40 mg by mouth daily.  ? FLUoxetine (PROZAC) 20 MG capsule Take 20 mg by mouth daily.  ? levothyroxine (SYNTHROID) 75 MCG tablet Take 1 tablet (75 mcg total) by mouth daily at 6 (six) AM.  ? Lido-PE-Glycerin-Petrolatum (PREPARATION H RAPID RELIEF EX) Apply topically every 6 (six) hours as needed (hemorrhoid).  ? lidocaine (L-M-X 4) 4 % cream Apply 1 application topically as needed. Apply to affected rectal area prior to dialysis.  ? midodrine (PROAMATINE) 10 MG tablet Take 10 mg by mouth See admin instructions. Take 1 tablet (10 mg) by mouth prior to dialysis on Tuesday, Thursday, Saturday  ? Nutritional Supplements (NOVASOURCE RENAL) LIQD Take 237 mLs by mouth with breakfast, with lunch, and with evening meal.  ? omega-3 acid ethyl esters (LOVAZA) 1 g capsule Take 2 capsules by mouth 2 (two) times daily.  ? ondansetron (ZOFRAN) 4 MG tablet Take 4 mg by mouth every 8 (eight) hours as needed for nausea or vomiting.  ? polyethylene glycol powder (GLYCOLAX/MIRALAX) 17 GM/SCOOP powder Take 17 g by mouth See admin instructions. Mix  17 grams of powder into 8 ounces of fluid, stir, and drink (by mouth) once a day  ? SACCHAROMYCES BOULARDII PO Take 1 capsule by mouth 2 (two) times daily.  ? senna-docusate (SENOKOT-S) 8.6-50 MG tablet Take 1 tablet by mouth at bedtime as needed for mild constipation.  ? sodium bicarbonate 650 MG tablet Take 1,300 mg by mouth 2 (two) times daily.  ? ?No facility-administered encounter medications on file as of 10/22/2021.  ? ? ?HOSPICE ELIGIBILITY/DIAGNOSIS: TBD ? ?PAST MEDICAL HISTORY:  ?Past Medical History:  ?Diagnosis Date  ? Ambulates with cane   ? Constipation   ? Depression   ? ESRD (end stage renal disease) ([place])   ? a. TTS Dialysis.  ? GERD (gastroesophageal reflux disease)   ? History of stress test   ? a. 01/2003 MV: EF 74%, no ischemia/infarct.  ? Hyperlipidemia   ? Hypertension   ? Osteoporosis   ? Stroke [facility]) 04-01-11  ? left frontal subcortical, saw Dr. [name]   ? Syncope 11/2019  ? TIA (transient ischemic attack) 03-12-11  ? Tobacco abuse   ? Tricuspid regurgitation   ? a. 05/2016 Echo: EF 65-70%, Gr2DD, mild MR, nl RV fxn, Triv TR, PASP 28mmHg; b. 05/2018 Echo: EF 60-65%, Gr2DD, mild MR/TR, RVSP/PASP 37mmHg; c. 11/2019 Echo: EF 55-60%, no rwma, mild-mod MR, Sev TR w/ RV dilatation (CTA chest neg for PE).  ? Type II diabetes mellitus ([place])   ? Vitamin D deficiency   ?  ? ?SOCIAL HX: @SOCX  Patient lives at [facility] for ongoing care ? ?FAMILY HX:  ?Family History  ?Problem Relation Age of  Onset  ? Stroke Mother   ? Diabetes Mother   ? Kidney failure Mother   ? Heart failure Mother   ? Stroke Father   ? Cancer Sister   ?     Breast- 50's  ? Colon cancer Maternal Grandmother   ? Pancreatic cancer Neg Hx   ? Esophageal cancer Neg Hx   ? Liver disease Neg Hx   ? Stomach cancer Neg Hx   ? ?ALLERGIES:  ?Allergies  ?Allergen Reactions  ? Hydrocodone Nausea And Vomiting and Other (See Comments)  ?  "Allergic," per MAR  ?   ? ?I spent 45 minutes providing this consultation; this includes time spent with patient/family,  chart review and documentation. More than 50% of the time in this consultation was spent on counseling and coordinating communication  ? ?Thank you for the opportunity to participate in the care of [place]

## 2021-10-25 ENCOUNTER — Ambulatory Visit (HOSPITAL_COMMUNITY)
Admission: RE | Admit: 2021-10-25 | Discharge: 2021-10-25 | Disposition: A | Discharge status: Home / Self Care | Payer: Medicare Other | Source: Ambulatory Visit | Attending: Nephrology | Admitting: Nephrology

## 2021-10-25 ENCOUNTER — Other Ambulatory Visit (HOSPITAL_COMMUNITY): Payer: Self-pay | Admitting: Nephrology

## 2021-10-25 DIAGNOSIS — R188 Other ascites: Secondary | ICD-10-CM | POA: Insufficient documentation

## 2021-10-25 NOTE — Progress Notes (Signed)
Patient presents for therapeutic. [name] limited  shows trace amount of peritoneal fluid noted  Insufficient to perform a safe paracentesis. Procedure not performed. ? ?

## 2021-11-22 ENCOUNTER — Non-Acute Institutional Stay: Payer: Medicare Other | Admitting: Hospice

## 2021-11-22 DIAGNOSIS — R269 Unspecified abnormalities of gait and mobility: Secondary | ICD-10-CM

## 2021-11-22 DIAGNOSIS — Z515 Encounter for palliative care: Secondary | ICD-10-CM

## 2021-11-22 DIAGNOSIS — F339 Major depressive disorder, recurrent, unspecified: Secondary | ICD-10-CM

## 2021-11-22 DIAGNOSIS — E43 Unspecified severe protein-calorie malnutrition: Secondary | ICD-10-CM

## 2021-11-22 DIAGNOSIS — N186 End stage renal disease: Secondary | ICD-10-CM

## 2021-11-22 NOTE — Progress Notes (Signed)
[facility] Consult Note Telephone: [phone]  Fax: [phone]  PATIENT NAME: [name] Michael DOB: [birth year] MRN: [id number]  PRIMARY CARE PROVIDER: Dr Felicita Gage Mammie Lorenzo NP  REFERRING PROVIDER:   Dr Felicita Gage Mammie Lorenzo NP    RESPONSIBLE PARTY: Self   Extended Emergency Contact Information Primary Emergency Contact: [place] of [place] Phone: [phone] Relation: Sister Secondary Emergency Contact: [place] Phone: [phone] Mobile Phone: [phone] Relation: Niece    Visit is to build trust and highlight Palliative Medicine as specialized medical care for people living with serious illness, aimed at facilitating better quality of life through symptoms relief, assisting with advance care plan and establishing goals of care.    RECOMMENDATIONS/PLAN:    Advance Care Planning/CODE STATUS: Patient affirmed she is a DO NOT RESUSCITATE.   GOALS OF CARE: Goals of care include to maximize quality of life and symptom management.  Symptom Management/Plan:  End-stage renal disease: On hemodialysis, well-tolerated.  Continue Tue Thur Sat dialysis. Continue Renvela and renal diet. Adhere to 1500 cc total fluid restriction. Gait disturbance:  Compliance with use of assistive device-ambulatory with rolling walker. Fall precautions in place, patient is compliant. Balance of rest and performance activity.   Protein caloric malnutrition: weight loss, Current weight 79 pounds.  Albumin 2.7 04/09/2021.  Continue to set up/offer help during meals to ensure adequate oral intake.  Diet-regular diet, mechanical soft.  Continue nova resource renal nutritional drink.  Mirtazapine 15 mg at bedtime to help boost appetite.  Routine CBC CMP Depression: Stable. Managed with Prozac.  Constipation: Stable. Miralax daily. Palliative will continue to monitor for symptom management/decline and make  recommendations as needed. Return 6 weeks or prn. Encouraged to call provider sooner with any concerns.    PPS: 50%   HOSPICE ELIGIBILITY/DIAGNOSIS: Patient is not interested in hospice service.   CHIEF COMPLAIN: Follow up visit  HISTORY OF PRESENT ILLNESS:  [name] Michael is a 61 y.o. female with multiple morbidities requiring close monitoring/management with high risk of complications and morbidity: ESRD on dialysis, CVA, CHF, gait disturbance, depression, protein caloric malnutrition. Patient denies pain/discomfort.  History obtained from review of EMR, History obtained from review of EMR, discussion with primary team, family and/or patient. Records reviewed and summarized above. All 10 point systems reviewed and are negative except as documented in history of present illness above  Review and summarization of Epic records shows history from other than patient.   Palliative Care was asked to help address complex decision making in the context of advance care planning and goals of care clarification.     ROS  General: NAD, appropriately dressed Constitution: Denies fever/chills EYES: denies vision changes ENMT: denies Xerostomia,  dysphagia Cardiovascular: denies chest pain Pulmonary: denies  cough, denies dyspnea  Abdomen: endorses fair to good appetite, denies constipation or diarrhea MSK:  endorses weakness,  no falls reported Skin: denies rashes/bruising Neurological: endorses weakness, denies pain, denies insomnia Psych: Endorses positive mood Heme/lymph/immuno: denies bruises, no abnormal bleeding   PHYSICAL EXAM  Height/Weight: 4 feet 9 inches/79 Ibs  General: In no acute distress, appropriately dressed Cardiovascular: regular rate and rhythm; no edema in BLE Pulmonary: no cough, no increased work of breathing, normal respiratory effort Abdomen: soft, non tender, no guarding, positive bowel sounds in all quadrants GU:  no suprapubic tenderness Eyes: Normal lids, no  discharge, sclera anicteric ENMT: Moist mucous membranes Musculoskeletal:  weakness, unsteady gait, ambulatory with rolling walker Skin: no rash to  visible skin, warm without cyanosis,  Psych: non-anxious affect Neurological: Weakness but otherwise non focal Heme/lymph/immuno: no bruises, no bleeding  PERTINENT MEDICATIONS:  Outpatient Encounter Medications as of 11/22/2021  Medication Sig   Amino Acids-Protein Hydrolys (FEEDING SUPPLEMENT, PRO-STAT SUGAR FREE 64,) LIQD Take 30 mLs by mouth in the morning. WILD CHERRY FLAVOR   aspirin 81 MG chewable tablet Chew 81 mg by mouth daily.   atorvastatin (LIPITOR) 40 MG tablet Take 40 mg by mouth at bedtime.    b complex-vitamin c-folic acid (NEPHRO-VITE) 0.8 MG TABS tablet Take 1 tablet by mouth at bedtime.   carvedilol (COREG) 6.25 MG tablet Take 1 tablet (6.25 mg total) by mouth 2 (two) times daily with a meal.   cloNIDine (CATAPRES) 0.1 MG tablet Take 0.1 mg by mouth every 8 (eight) hours as needed (BP>160/90).   esomeprazole (NEXIUM) 40 MG capsule Take 40 mg by mouth daily.   FLUoxetine (PROZAC) 20 MG capsule Take 20 mg by mouth daily.   levothyroxine (SYNTHROID) 75 MCG tablet Take 1 tablet (75 mcg total) by mouth daily at 6 (six) AM.   Lido-PE-Glycerin-Petrolatum (PREPARATION H RAPID RELIEF EX) Apply topically every 6 (six) hours as needed (hemorrhoid).   lidocaine (L-M-X 4) 4 % cream Apply 1 application topically as needed. Apply to affected rectal area prior to dialysis.   midodrine (PROAMATINE) 10 MG tablet Take 10 mg by mouth See admin instructions. Take 1 tablet (10 mg) by mouth prior to dialysis on Tuesday, Thursday, Saturday   Nutritional Supplements (NOVASOURCE RENAL) LIQD Take 237 mLs by mouth with breakfast, with lunch, and with evening meal.   omega-3 acid ethyl esters (LOVAZA) 1 g capsule Take 2 capsules by mouth 2 (two) times daily.   ondansetron (ZOFRAN) 4 MG tablet Take 4 mg by mouth every 8 (eight) hours as needed for nausea or  vomiting.   polyethylene glycol powder (GLYCOLAX/MIRALAX) 17 GM/SCOOP powder Take 17 g by mouth See admin instructions. Mix 17 grams of powder into 8 ounces of fluid, stir, and drink (by mouth) once a day   SACCHAROMYCES BOULARDII PO Take 1 capsule by mouth 2 (two) times daily.   senna-docusate (SENOKOT-S) 8.6-50 MG tablet Take 1 tablet by mouth at bedtime as needed for mild constipation.   sodium bicarbonate 650 MG tablet Take 1,300 mg by mouth 2 (two) times daily.   No facility-administered encounter medications on file as of 11/22/2021.    HOSPICE ELIGIBILITY/DIAGNOSIS: TBD  PAST MEDICAL HISTORY:  Past Medical History:  Diagnosis Date   Ambulates with cane    Constipation    Depression    ESRD (end stage renal disease) ([place])    a. TTS Dialysis.   GERD (gastroesophageal reflux disease)    History of stress test    a. 01/2003 MV: EF 74%, no ischemia/infarct.   Hyperlipidemia    Hypertension    Osteoporosis    Stroke [facility]) 04-01-11   left frontal subcortical, saw Dr. [name]    Syncope 11/2019   TIA (transient ischemic attack) 03-12-11   Tobacco abuse    Tricuspid regurgitation    a. 05/2016 Echo: EF 65-70%, Gr2DD, mild MR, nl RV fxn, Triv TR, PASP 77mmHg; b. 05/2018 Echo: EF 60-65%, Gr2DD, mild MR/TR, RVSP/PASP 34mmHg; c. 11/2019 Echo: EF 55-60%, no rwma, mild-mod MR, Sev TR w/ RV dilatation (CTA chest neg for PE).   Type II diabetes mellitus (HCC)    Vitamin D deficiency      SOCIAL HX: @SOCX  Patient lives at [facility]  for ongoing care  FAMILY HX:  Family History  Problem Relation Age of Onset   Stroke Mother    Diabetes Mother    Kidney failure Mother    Heart failure Mother    Stroke Father    Cancer Sister        Breast- 76's   Colon cancer Maternal Grandmother    Pancreatic cancer Neg Hx    Esophageal cancer Neg Hx    Liver disease Neg Hx    Stomach cancer Neg Hx    ALLERGIES:  Allergies  Allergen Reactions   Hydrocodone Nausea And Vomiting and Other (See Comments)     "Allergic," per MAR      I spent 45 minutes providing this consultation; this includes time spent with patient/family, chart review and documentation. More than 50% of the time in this consultation was spent on counseling and coordinating communication   Thank you for the opportunity to participate in the care of GERTIE BROERMAN Please call our office at [phone] if we can be of additional assistance.  Note: Portions of this note were generated with Lobbyist. Dictation errors may occur despite best attempts at proofreading.  Teodoro Spray, NP

## 2021-12-11 ENCOUNTER — Emergency Department (HOSPITAL_COMMUNITY)
Admission: EM | Admit: 2021-12-11 | Discharge: 2021-12-11 | Disposition: A | Discharge status: Home / Self Care | Payer: Medicare Other | Attending: Emergency Medicine | Admitting: Emergency Medicine

## 2021-12-11 ENCOUNTER — Other Ambulatory Visit: Payer: Self-pay

## 2021-12-11 ENCOUNTER — Emergency Department (HOSPITAL_COMMUNITY): Payer: Medicare Other

## 2021-12-11 DIAGNOSIS — W228XXA Striking against or struck by other objects, initial encounter: Secondary | ICD-10-CM | POA: Insufficient documentation

## 2021-12-11 DIAGNOSIS — Y92129 Unspecified place in nursing home as the place of occurrence of the external cause: Secondary | ICD-10-CM | POA: Insufficient documentation

## 2021-12-11 DIAGNOSIS — Z7982 Long term (current) use of aspirin: Secondary | ICD-10-CM | POA: Insufficient documentation

## 2021-12-11 DIAGNOSIS — M25552 Pain in left hip: Secondary | ICD-10-CM | POA: Insufficient documentation

## 2021-12-11 MED ORDER — OXYCODONE HCL 5 MG PO TABS
2.5000 mg | ORAL_TABLET | Freq: Once | ORAL | Status: AC
  Administered 2021-12-11: 2.5 mg via ORAL
  Filled 2021-12-11: qty 1

## 2021-12-11 MED ORDER — ACETAMINOPHEN 500 MG PO TABS
1000.0000 mg | ORAL_TABLET | Freq: Once | ORAL | Status: AC
  Administered 2021-12-11: 1000 mg via ORAL
  Filled 2021-12-11: qty 2

## 2021-12-11 NOTE — Discharge Instructions (Signed)
Your x-ray does not show any obvious break to your hip.  Please follow-up with your family doctor in the office.  Take Tylenol as needed for pain at home.

## 2021-12-11 NOTE — ED Notes (Signed)
Pt 5th on PTAR list

## 2021-12-11 NOTE — ED Triage Notes (Signed)
PT bib ems due to pt being hit by motorized wheelchair. Pt is from [place]. Pt has cognitive delay. Axox4. Pt denies LOC, was not witnessed. VSS.
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
## 2021-12-11 NOTE — ED Notes (Signed)
Ptar called, 8-9th person on list

## 2021-12-16 ENCOUNTER — Other Ambulatory Visit (HOSPITAL_COMMUNITY): Payer: Self-pay | Admitting: Nephrology

## 2021-12-16 ENCOUNTER — Other Ambulatory Visit: Payer: Self-pay | Admitting: Nephrology

## 2021-12-16 DIAGNOSIS — R188 Other ascites: Secondary | ICD-10-CM

## 2021-12-20 ENCOUNTER — Other Ambulatory Visit (HOSPITAL_COMMUNITY): Payer: Self-pay | Admitting: Nephrology

## 2021-12-20 ENCOUNTER — Ambulatory Visit (HOSPITAL_COMMUNITY)
Admission: RE | Admit: 2021-12-20 | Discharge: 2021-12-20 | Disposition: A | Discharge status: Home / Self Care | Payer: Medicare Other | Source: Ambulatory Visit | Attending: Nephrology | Admitting: Nephrology

## 2021-12-20 DIAGNOSIS — R188 Other ascites: Secondary | ICD-10-CM | POA: Insufficient documentation

## 2021-12-20 NOTE — Progress Notes (Signed)
Patient presents for  therapeutic paracentesis. [name] limited abdomen shows trace amount of trace fluid noted  Insufficient to perform a safe paracentesis. Procedure not performed.

## 2022-01-22 ENCOUNTER — Other Ambulatory Visit: Payer: Self-pay | Admitting: Gastroenterology

## 2022-01-22 DIAGNOSIS — R197 Diarrhea, unspecified: Secondary | ICD-10-CM

## 2022-01-22 DIAGNOSIS — R159 Full incontinence of feces: Secondary | ICD-10-CM

## 2022-01-22 DIAGNOSIS — K649 Unspecified hemorrhoids: Secondary | ICD-10-CM

## 2022-01-22 DIAGNOSIS — R112 Nausea with vomiting, unspecified: Secondary | ICD-10-CM

## 2022-06-02 ENCOUNTER — Non-Acute Institutional Stay: Payer: Medicare Other | Admitting: Hospice

## 2022-06-02 DIAGNOSIS — Z515 Encounter for palliative care: Secondary | ICD-10-CM

## 2022-06-02 DIAGNOSIS — Z992 Dependence on renal dialysis: Secondary | ICD-10-CM

## 2022-06-02 DIAGNOSIS — E43 Unspecified severe protein-calorie malnutrition: Secondary | ICD-10-CM

## 2022-06-02 DIAGNOSIS — K5901 Slow transit constipation: Secondary | ICD-10-CM

## 2022-06-02 DIAGNOSIS — R269 Unspecified abnormalities of gait and mobility: Secondary | ICD-10-CM

## 2022-06-02 DIAGNOSIS — F339 Major depressive disorder, recurrent, unspecified: Secondary | ICD-10-CM

## 2022-06-02 NOTE — Progress Notes (Signed)
[place] Consult Note Telephone: [phone]  Fax: [phone]  PATIENT NAME: [name] Michael DOB: December 28, 1960 MRN: [id number]  PRIMARY CARE PROVIDER: Dr Felicita Gage Mammie Lorenzo NP  REFERRING PROVIDER:   Dr Felicita Gage Mammie Lorenzo NP    RESPONSIBLE PARTY: Self   Extended Emergency Contact Information Primary Emergency Contact: [place] of [place] Phone: [phone] Relation: Sister Secondary Emergency Contact: [place] Phone: [phone] Mobile Phone: [phone] Relation: Niece    Visit is to build trust and highlight Palliative Medicine as specialized medical care for people living with serious illness, aimed at facilitating better quality of life through symptoms relief, assisting with advance care plan and establishing goals of care.    RECOMMENDATIONS/PLAN:    Advance Care Planning/CODE STATUS: Patient affirmed she is a DO NOT RESUSCITATE.   GOALS OF CARE: Goals of care include to maximize quality of life and symptom management.  Symptom Management/Plan:  End-stage renal disease: Compliant with hemodialysis, well-tolerated.  Continue Tue Thur Sat dialysis. Continue Renvela and renal diet. Adhere to 1500 cc total fluid restriction. Gait disturbance:  Compliance with use of assistive device-ambulatory with rolling walker. Fall precautions in place. Balance of rest and performance activity.   Protein caloric malnutrition: gained 13 pounds since last visit 3 months ago, current weight is 90 Ibs.  Albumin 3.7 05/19/22 up from 2.7 04/09/2021.  Continue to set up/offer help during meals to ensure adequate oral intake.  Diet-regular diet, mechanical soft.  Continue nova resource renal nutritional drink.  Mirtazapine 7.5 mg at bedtime to help boost appetite.  Routine CBC CMP Depression: Stable. Managed with Prozac.  Constipation: Stable. Miralax daily. Palliative will continue to monitor for  symptom management/decline and make recommendations as needed. Return 6 weeks or prn. Encouraged to call provider sooner with any concerns.    PPS: 50%   HOSPICE ELIGIBILITY/DIAGNOSIS: Patient is not interested in hospice service.   CHIEF COMPLAIN: Follow up visit  HISTORY OF PRESENT ILLNESS:  KIAMESHA Michael is a 61 y.o. female with multiple morbidities requiring close monitoring/management with high risk of complications and morbidity: ESRD on dialysis, CVA, CHF, gait disturbance, depression, protein caloric malnutrition. Patient denies pain/discomfort.  History obtained from review of EMR, History obtained from review of EMR, discussion with primary team, family and/or patient. Records reviewed and summarized above. All 10 point systems reviewed and are negative except as documented in history of present illness above  Review and summarization of Epic records shows history from other than patient.   Palliative Care was asked to help address complex decision making in the context of advance care planning and goals of care clarification.     PERTINENT MEDICATIONS:  Outpatient Encounter Medications as of 06/02/2022  Medication Sig   Amino Acids-Protein Hydrolys (FEEDING SUPPLEMENT, PRO-STAT SUGAR FREE 64,) LIQD Take 30 mLs by mouth in the morning. WILD CHERRY FLAVOR   aspirin 81 MG chewable tablet Chew 81 mg by mouth daily.   atorvastatin (LIPITOR) 40 MG tablet Take 40 mg by mouth at bedtime.    b complex-vitamin c-folic acid (NEPHRO-VITE) 0.8 MG TABS tablet Take 1 tablet by mouth at bedtime.   carvedilol (COREG) 6.25 MG tablet Take 1 tablet (6.25 mg total) by mouth 2 (two) times daily with a meal.   cloNIDine (CATAPRES) 0.1 MG tablet Take 0.1 mg by mouth every 8 (eight) hours as needed (BP>160/90).   esomeprazole (NEXIUM) 40 MG capsule Take 40 mg by mouth daily.   FLUoxetine (  PROZAC) 20 MG capsule Take 20 mg by mouth daily.   levothyroxine (SYNTHROID) 75 MCG tablet Take 1 tablet (75 mcg  total) by mouth daily at 6 (six) AM.   Lido-PE-Glycerin-Petrolatum (PREPARATION H RAPID RELIEF EX) Apply topically every 6 (six) hours as needed (hemorrhoid).   lidocaine (L-M-X 4) 4 % cream Apply 1 application topically as needed. Apply to affected rectal area prior to dialysis.   midodrine (PROAMATINE) 10 MG tablet Take 10 mg by mouth See admin instructions. Take 1 tablet (10 mg) by mouth prior to dialysis on Tuesday, Thursday, Saturday   Nutritional Supplements (NOVASOURCE RENAL) LIQD Take 237 mLs by mouth with breakfast, with lunch, and with evening meal.   omega-3 acid ethyl esters (LOVAZA) 1 g capsule Take 2 capsules by mouth 2 (two) times daily.   ondansetron (ZOFRAN) 4 MG tablet Take 4 mg by mouth every 8 (eight) hours as needed for nausea or vomiting.   polyethylene glycol powder (GLYCOLAX/MIRALAX) 17 GM/SCOOP powder Take 17 g by mouth See admin instructions. Mix 17 grams of powder into 8 ounces of fluid, stir, and drink (by mouth) once a day   SACCHAROMYCES BOULARDII PO Take 1 capsule by mouth 2 (two) times daily.   senna-docusate (SENOKOT-S) 8.6-50 MG tablet Take 1 tablet by mouth at bedtime as needed for mild constipation.   sodium bicarbonate 650 MG tablet Take 1,300 mg by mouth 2 (two) times daily.   No facility-administered encounter medications on file as of 06/02/2022.    HOSPICE ELIGIBILITY/DIAGNOSIS: TBD  PAST MEDICAL HISTORY:  Past Medical History:  Diagnosis Date   Ambulates with cane    Constipation    Depression    ESRD (end stage renal disease) ([place])    a. TTS Dialysis.   GERD (gastroesophageal reflux disease)    History of stress test    a. 01/2003 MV: EF 74%, no ischemia/infarct.   Hyperlipidemia    Hypertension    Osteoporosis    Stroke [facility]) 04-01-11   left frontal subcortical, saw Dr. [name]    Syncope 11/2019   TIA (transient ischemic attack) 03-12-11   Tobacco abuse    Tricuspid regurgitation    a. 05/2016 Echo: EF 65-70%, Gr2DD, mild MR, nl RV fxn,  Triv TR, PASP 8mmHg; b. 05/2018 Echo: EF 60-65%, Gr2DD, mild MR/TR, RVSP/PASP 41mmHg; c. 11/2019 Echo: EF 55-60%, no rwma, mild-mod MR, Sev TR w/ RV dilatation (CTA chest neg for PE).   Type II diabetes mellitus (HCC)    Vitamin D deficiency      SOCIAL HX: @SOCX  Patient lives at SNF for ongoing care  FAMILY HX:  Family History  Problem Relation Age of Onset   Stroke Mother    Diabetes Mother    Kidney failure Mother    Heart failure Mother    Stroke Father    Cancer Sister        Breast- 16's   Colon cancer Maternal Grandmother    Pancreatic cancer Neg Hx    Esophageal cancer Neg Hx    Liver disease Neg Hx    Stomach cancer Neg Hx    ALLERGIES:  Allergies  Allergen Reactions   Hydrocodone Nausea And Vomiting and Other (See Comments)    "Allergic," per MAR      I spent 45 minutes providing this consultation; this includes time spent with patient/family, chart review and documentation. More than 50% of the time in this consultation was spent on counseling and coordinating communication   Thank you for the  opportunity to participate in the care of TEIGHAN AUBERT Please call our office at [phone] if we can be of additional assistance.  Note: Portions of this note were generated with Lobbyist. Dictation errors may occur despite best attempts at proofreading.  Teodoro Spray, NP

## 2022-07-17 ENCOUNTER — Non-Acute Institutional Stay: Payer: Medicare Other | Admitting: Hospice

## 2022-07-17 DIAGNOSIS — R269 Unspecified abnormalities of gait and mobility: Secondary | ICD-10-CM

## 2022-07-17 DIAGNOSIS — K5901 Slow transit constipation: Secondary | ICD-10-CM

## 2022-07-17 DIAGNOSIS — F339 Major depressive disorder, recurrent, unspecified: Secondary | ICD-10-CM

## 2022-07-17 DIAGNOSIS — E440 Moderate protein-calorie malnutrition: Secondary | ICD-10-CM

## 2022-07-17 DIAGNOSIS — Z515 Encounter for palliative care: Secondary | ICD-10-CM

## 2022-07-17 DIAGNOSIS — N186 End stage renal disease: Secondary | ICD-10-CM

## 2022-07-17 NOTE — Progress Notes (Addendum)
[place] Consult Note Telephone: [phone]  Fax: [phone] Date of visit: 07/17/2022 PATIENT NAME: [name] Michael DOB: June 22, 1961 MRN: [id number]  PRIMARY CARE PROVIDER: Dr Felicita Gage Mammie Lorenzo NP  REFERRING PROVIDER:   Dr Felicita Gage Mammie Lorenzo NP    RESPONSIBLE PARTY: Self   Extended Emergency Contact Information Primary Emergency Contact: [place] Phone: [phone] Relation: Sister Secondary Emergency Contact: [place] Phone: [phone] Mobile Phone: [phone] Relation: Niece    Visit is to build trust and highlight Palliative Medicine as specialized medical care for people living with serious illness, aimed at facilitating better quality of life through symptoms relief, assisting with advance care plan and establishing goals of care.    RECOMMENDATIONS/PLAN:    Advance Care Planning/CODE STATUS: Patient affirmed she is a DO NOT RESUSCITATE.   GOALS OF CARE: Goals of care include to maximize quality of life and symptom management.  Symptom Management/Plan:  End-stage renal disease: Compliant with hemodialysis, well-tolerated; getting ready to leave for dialysis.  Continue Tue Thur Sat dialysis. Continue Renvela and renal diet. Adhere to 1500 cc total fluid restriction. Gait disturbance: No report of recent fall.  Compliance with use of assistive device-ambulatory with rolling walker. Fall precautions in place. Balance of rest and performance activity.   Protein caloric malnutrition: Improving; she gained 14 pounds since last visit 4 months ago, current weight is 91Ibs.  Albumin 3.7 05/19/22 up from 2.7 04/09/2021.  Continue to set up/offer help during meals to ensure adequate oral intake.  Diet-regular diet, mechanical soft.  Continue nova resource renal nutritional drink.  Mirtazapine 7.5 mg at bedtime to help boost appetite.  Routine CBC CMP Depression: Stable.  Managed with Prozac.  Constipation: Stable. Miralax daily. Palliative will continue to monitor for symptom management/decline and make recommendations as needed. Return 6 weeks or prn. Encouraged to call provider sooner with any concerns.    PPS: 50%   HOSPICE ELIGIBILITY/DIAGNOSIS: Patient is not interested in hospice service.   CHIEF COMPLAIN: Follow up visit  HISTORY OF PRESENT ILLNESS:  [name] Michael is a 62 y.o. female with multiple morbidities requiring close monitoring/management with high risk of complications and morbidity: ESRD on dialysis, CVA, CHF, gait disturbance, depression, protein caloric malnutrition. Patient denies pain/discomfort.  History obtained from review of EMR, History obtained from review of EMR, discussion with primary team, family and/or patient. Records reviewed and summarized above. All 10 point systems reviewed and are negative except as documented in history of present illness above  Review and summarization of Epic records shows history from other than patient.   Palliative Care was asked to help address complex decision making in the context of advance care planning and goals of care clarification.     PERTINENT MEDICATIONS:  Outpatient Encounter Medications as of 07/17/2022  Medication Sig   Amino Acids-Protein Hydrolys (FEEDING SUPPLEMENT, PRO-STAT SUGAR FREE 64,) LIQD Take 30 mLs by mouth in the morning. WILD CHERRY FLAVOR   aspirin 81 MG chewable tablet Chew 81 mg by mouth daily.   atorvastatin (LIPITOR) 40 MG tablet Take 40 mg by mouth at bedtime.    b complex-vitamin c-folic acid (NEPHRO-VITE) 0.8 MG TABS tablet Take 1 tablet by mouth at bedtime.   carvedilol (COREG) 6.25 MG tablet Take 1 tablet (6.25 mg total) by mouth 2 (two) times daily with a meal.   cloNIDine (CATAPRES) 0.1 MG tablet Take 0.1 mg by mouth every 8 (eight) hours as needed (BP>160/90).  esomeprazole (NEXIUM) 40 MG capsule Take 40 mg by mouth daily.   FLUoxetine (PROZAC) 20 MG  capsule Take 20 mg by mouth daily.   levothyroxine (SYNTHROID) 75 MCG tablet Take 1 tablet (75 mcg total) by mouth daily at 6 (six) AM.   Lido-PE-Glycerin-Petrolatum (PREPARATION H RAPID RELIEF EX) Apply topically every 6 (six) hours as needed (hemorrhoid).   lidocaine (L-M-X 4) 4 % cream Apply 1 application topically as needed. Apply to affected rectal area prior to dialysis.   midodrine (PROAMATINE) 10 MG tablet Take 10 mg by mouth See admin instructions. Take 1 tablet (10 mg) by mouth prior to dialysis on Tuesday, Thursday, Saturday   Nutritional Supplements (NOVASOURCE RENAL) LIQD Take 237 mLs by mouth with breakfast, with lunch, and with evening meal.   omega-3 acid ethyl esters (LOVAZA) 1 g capsule Take 2 capsules by mouth 2 (two) times daily.   ondansetron (ZOFRAN) 4 MG tablet Take 4 mg by mouth every 8 (eight) hours as needed for nausea or vomiting.   polyethylene glycol powder (GLYCOLAX/MIRALAX) 17 GM/SCOOP powder Take 17 g by mouth See admin instructions. Mix 17 grams of powder into 8 ounces of fluid, stir, and drink (by mouth) once a day   SACCHAROMYCES BOULARDII PO Take 1 capsule by mouth 2 (two) times daily.   senna-docusate (SENOKOT-S) 8.6-50 MG tablet Take 1 tablet by mouth at bedtime as needed for mild constipation.   sodium bicarbonate 650 MG tablet Take 1,300 mg by mouth 2 (two) times daily.   No facility-administered encounter medications on file as of 07/17/2022.    HOSPICE ELIGIBILITY/DIAGNOSIS: TBD  PAST MEDICAL HISTORY:  Past Medical History:  Diagnosis Date   Ambulates with cane    Constipation    Depression    ESRD (end stage renal disease) ([place])    a. TTS Dialysis.   GERD (gastroesophageal reflux disease)    History of stress test    a. 01/2003 MV: EF 74%, no ischemia/infarct.   Hyperlipidemia    Hypertension    Osteoporosis    Stroke [facility]) 04-01-11   left frontal subcortical, saw Dr. [name]    Syncope 11/2019   TIA (transient ischemic attack) 03-12-11    Tobacco abuse    Tricuspid regurgitation    a. 05/2016 Echo: EF 65-70%, Gr2DD, mild MR, nl RV fxn, Triv TR, PASP 62mmHg; b. 05/2018 Echo: EF 60-65%, Gr2DD, mild MR/TR, RVSP/PASP 88mmHg; c. 11/2019 Echo: EF 55-60%, no rwma, mild-mod MR, Sev TR w/ RV dilatation (CTA chest neg for PE).   Type II diabetes mellitus (HCC)    Vitamin D deficiency      SOCIAL HX: @SOCX  Patient lives at SNF for ongoing care  FAMILY HX:  Family History  Problem Relation Age of Onset   Stroke Mother    Diabetes Mother    Kidney failure Mother    Heart failure Mother    Stroke Father    Cancer Sister        Breast- 64's   Colon cancer Maternal Grandmother    Pancreatic cancer Neg Hx    Esophageal cancer Neg Hx    Liver disease Neg Hx    Stomach cancer Neg Hx    ALLERGIES:  Allergies  Allergen Reactions   Hydrocodone Nausea And Vomiting and Other (See Comments)    "Allergic," per MAR      I spent 35 minutes providing this consultation; this includes time spent with patient/family, chart review and documentation. More than 50% of the time in this  consultation was spent on counseling and coordinating communication   Thank you for the opportunity to participate in the care of [name] Michael Please call our office at [phone] if we can be of additional assistance.  Note: Portions of this note were generated with Lobbyist. Dictation errors may occur despite best attempts at proofreading.  Teodoro Spray, NP

## 2022-07-18 ENCOUNTER — Emergency Department (HOSPITAL_COMMUNITY): Payer: Medicare Other

## 2022-07-18 ENCOUNTER — Encounter (HOSPITAL_COMMUNITY): Payer: Self-pay

## 2022-07-18 ENCOUNTER — Inpatient Hospital Stay (HOSPITAL_COMMUNITY)
Admission: EM | Admit: 2022-07-18 | Discharge: 2022-07-21 | DRG: 193 | Disposition: A | Discharge status: Skilled Nursing Facility | Payer: Medicare Other | Source: Skilled Nursing Facility | Attending: Internal Medicine | Admitting: Internal Medicine

## 2022-07-18 ENCOUNTER — Other Ambulatory Visit: Payer: Self-pay

## 2022-07-18 DIAGNOSIS — Z66 Do not resuscitate: Secondary | ICD-10-CM | POA: Diagnosis present

## 2022-07-18 DIAGNOSIS — K219 Gastro-esophageal reflux disease without esophagitis: Secondary | ICD-10-CM | POA: Diagnosis present

## 2022-07-18 DIAGNOSIS — I2489 Other forms of acute ischemic heart disease: Secondary | ICD-10-CM | POA: Diagnosis present

## 2022-07-18 DIAGNOSIS — I10 Essential (primary) hypertension: Secondary | ICD-10-CM | POA: Diagnosis present

## 2022-07-18 DIAGNOSIS — Z823 Family history of stroke: Secondary | ICD-10-CM

## 2022-07-18 DIAGNOSIS — F17210 Nicotine dependence, cigarettes, uncomplicated: Secondary | ICD-10-CM | POA: Diagnosis present

## 2022-07-18 DIAGNOSIS — Z8673 Personal history of transient ischemic attack (TIA), and cerebral infarction without residual deficits: Secondary | ICD-10-CM

## 2022-07-18 DIAGNOSIS — D638 Anemia in other chronic diseases classified elsewhere: Secondary | ICD-10-CM | POA: Diagnosis present

## 2022-07-18 DIAGNOSIS — R0902 Hypoxemia: Principal | ICD-10-CM

## 2022-07-18 DIAGNOSIS — N2581 Secondary hyperparathyroidism of renal origin: Secondary | ICD-10-CM | POA: Diagnosis present

## 2022-07-18 DIAGNOSIS — R7989 Other specified abnormal findings of blood chemistry: Secondary | ICD-10-CM

## 2022-07-18 DIAGNOSIS — Z1152 Encounter for screening for COVID-19: Secondary | ICD-10-CM

## 2022-07-18 DIAGNOSIS — J121 Respiratory syncytial virus pneumonia: Principal | ICD-10-CM | POA: Diagnosis present

## 2022-07-18 DIAGNOSIS — J9601 Acute respiratory failure with hypoxia: Secondary | ICD-10-CM | POA: Diagnosis present

## 2022-07-18 DIAGNOSIS — Z79890 Hormone replacement therapy (postmenopausal): Secondary | ICD-10-CM

## 2022-07-18 DIAGNOSIS — J205 Acute bronchitis due to respiratory syncytial virus: Secondary | ICD-10-CM

## 2022-07-18 DIAGNOSIS — E119 Type 2 diabetes mellitus without complications: Secondary | ICD-10-CM

## 2022-07-18 DIAGNOSIS — Z841 Family history of disorders of kidney and ureter: Secondary | ICD-10-CM

## 2022-07-18 DIAGNOSIS — Z992 Dependence on renal dialysis: Secondary | ICD-10-CM

## 2022-07-18 DIAGNOSIS — Z833 Family history of diabetes mellitus: Secondary | ICD-10-CM

## 2022-07-18 DIAGNOSIS — E1122 Type 2 diabetes mellitus with diabetic chronic kidney disease: Secondary | ICD-10-CM | POA: Diagnosis present

## 2022-07-18 DIAGNOSIS — D631 Anemia in chronic kidney disease: Secondary | ICD-10-CM | POA: Diagnosis present

## 2022-07-18 DIAGNOSIS — Z720 Tobacco use: Secondary | ICD-10-CM

## 2022-07-18 DIAGNOSIS — N186 End stage renal disease: Secondary | ICD-10-CM | POA: Diagnosis present

## 2022-07-18 DIAGNOSIS — E559 Vitamin D deficiency, unspecified: Secondary | ICD-10-CM | POA: Diagnosis present

## 2022-07-18 DIAGNOSIS — E785 Hyperlipidemia, unspecified: Secondary | ICD-10-CM | POA: Diagnosis present

## 2022-07-18 DIAGNOSIS — Z7982 Long term (current) use of aspirin: Secondary | ICD-10-CM

## 2022-07-18 DIAGNOSIS — E039 Hypothyroidism, unspecified: Secondary | ICD-10-CM | POA: Diagnosis present

## 2022-07-18 DIAGNOSIS — Z800 Family history of malignant neoplasm of digestive organs: Secondary | ICD-10-CM

## 2022-07-18 DIAGNOSIS — I071 Rheumatic tricuspid insufficiency: Secondary | ICD-10-CM | POA: Diagnosis present

## 2022-07-18 DIAGNOSIS — M810 Age-related osteoporosis without current pathological fracture: Secondary | ICD-10-CM | POA: Diagnosis present

## 2022-07-18 DIAGNOSIS — R54 Age-related physical debility: Secondary | ICD-10-CM | POA: Diagnosis present

## 2022-07-18 DIAGNOSIS — Z8249 Family history of ischemic heart disease and other diseases of the circulatory system: Secondary | ICD-10-CM

## 2022-07-18 DIAGNOSIS — I132 Hypertensive heart and chronic kidney disease with heart failure and with stage 5 chronic kidney disease, or end stage renal disease: Secondary | ICD-10-CM | POA: Diagnosis present

## 2022-07-18 DIAGNOSIS — Z79899 Other long term (current) drug therapy: Secondary | ICD-10-CM

## 2022-07-18 DIAGNOSIS — Z885 Allergy status to narcotic agent status: Secondary | ICD-10-CM

## 2022-07-18 DIAGNOSIS — M898X9 Other specified disorders of bone, unspecified site: Secondary | ICD-10-CM | POA: Diagnosis present

## 2022-07-18 DIAGNOSIS — I5033 Acute on chronic diastolic (congestive) heart failure: Secondary | ICD-10-CM | POA: Diagnosis present

## 2022-07-18 LAB — COMPREHENSIVE METABOLIC PANEL
ALT: 13 U/L (ref 0–44)
AST: 31 U/L (ref 15–41)
Albumin: 2.9 g/dL — ABNORMAL LOW (ref 3.5–5.0)
Alkaline Phosphatase: 87 U/L (ref 38–126)
Anion gap: 13 (ref 5–15)
BUN: 16 mg/dL (ref 8–23)
CO2: 30 mmol/L (ref 22–32)
Calcium: 7.9 mg/dL — ABNORMAL LOW (ref 8.9–10.3)
Chloride: 93 mmol/L — ABNORMAL LOW (ref 98–111)
Creatinine, Ser: 3.56 mg/dL — ABNORMAL HIGH (ref 0.44–1.00)
GFR, Estimated: 14 mL/min — ABNORMAL LOW (ref >60)
Glucose, Bld: 81 mg/dL (ref 70–99)
Potassium: 3.8 mmol/L (ref 3.5–5.1)
Sodium: 136 mmol/L (ref 135–145)
Total Bilirubin: 0.7 mg/dL (ref 0.3–1.2)
Total Protein: 5.9 g/dL — ABNORMAL LOW (ref 6.5–8.1)

## 2022-07-18 LAB — I-STAT VENOUS BLOOD GAS, ED
Acid-Base Excess: 9 mmol/L — ABNORMAL HIGH (ref 0.0–2.0)
Bicarbonate: 36.3 mmol/L — ABNORMAL HIGH (ref 20.0–28.0)
Calcium, Ion: 0.94 mmol/L — ABNORMAL LOW (ref 1.15–1.40)
HCT: 38 % (ref 36.0–46.0)
Hemoglobin: 12.9 g/dL (ref 12.0–15.0)
O2 Saturation: 78 %
Potassium: 5.6 mmol/L — ABNORMAL HIGH (ref 3.5–5.1)
Sodium: 132 mmol/L — ABNORMAL LOW (ref 135–145)
TCO2: 38 mmol/L — ABNORMAL HIGH (ref 22–32)
pCO2, Ven: 64.6 mmHg — ABNORMAL HIGH (ref 44–60)
pH, Ven: 7.358 (ref 7.25–7.43)
pO2, Ven: 46 mmHg — ABNORMAL HIGH (ref 32–45)

## 2022-07-18 LAB — CBC WITH DIFFERENTIAL/PLATELET
Abs Immature Granulocytes: 0.05 10*3/uL (ref 0.00–0.07)
Basophils Absolute: 0 10*3/uL (ref 0.0–0.1)
Basophils Relative: 1 %
Eosinophils Absolute: 0 10*3/uL (ref 0.0–0.5)
Eosinophils Relative: 0 %
HCT: 29.9 % — ABNORMAL LOW (ref 36.0–46.0)
Hemoglobin: 9.9 g/dL — ABNORMAL LOW (ref 12.0–15.0)
Immature Granulocytes: 1 %
Lymphocytes Relative: 11 %
Lymphs Abs: 0.5 10*3/uL — ABNORMAL LOW (ref 0.7–4.0)
MCH: 30.8 pg (ref 26.0–34.0)
MCHC: 33.1 g/dL (ref 30.0–36.0)
MCV: 93.1 fL (ref 80.0–100.0)
Monocytes Absolute: 0.5 10*3/uL (ref 0.1–1.0)
Monocytes Relative: 10 %
Neutro Abs: 3.8 10*3/uL (ref 1.7–7.7)
Neutrophils Relative %: 77 %
Platelets: 166 10*3/uL (ref 150–400)
RBC: 3.21 MIL/uL — ABNORMAL LOW (ref 3.87–5.11)
RDW: 15.8 % — ABNORMAL HIGH (ref 11.5–15.5)
WBC: 4.9 10*3/uL (ref 4.0–10.5)
nRBC: 0.4 % — ABNORMAL HIGH (ref 0.0–0.2)

## 2022-07-18 LAB — HEPATITIS B SURFACE ANTIGEN: Hepatitis B Surface Ag: NONREACTIVE

## 2022-07-18 LAB — I-STAT CHEM 8, ED
BUN: 18 mg/dL (ref 8–23)
Calcium, Ion: 0.94 mmol/L — ABNORMAL LOW (ref 1.15–1.40)
Chloride: 92 mmol/L — ABNORMAL LOW (ref 98–111)
Creatinine, Ser: 3.8 mg/dL — ABNORMAL HIGH (ref 0.44–1.00)
Glucose, Bld: 81 mg/dL (ref 70–99)
HCT: 33 % — ABNORMAL LOW (ref 36.0–46.0)
Hemoglobin: 11.2 g/dL — ABNORMAL LOW (ref 12.0–15.0)
Potassium: 3.6 mmol/L (ref 3.5–5.1)
Sodium: 135 mmol/L (ref 135–145)
TCO2: 33 mmol/L — ABNORMAL HIGH (ref 22–32)

## 2022-07-18 LAB — BRAIN NATRIURETIC PEPTIDE: B Natriuretic Peptide: 4488.6 pg/mL — ABNORMAL HIGH (ref 0.0–100.0)

## 2022-07-18 LAB — LACTIC ACID, PLASMA
Lactic Acid, Venous: 1.4 mmol/L (ref 0.5–1.9)
Lactic Acid, Venous: 1.3 mmol/L (ref 0.5–1.9)

## 2022-07-18 LAB — TROPONIN I (HIGH SENSITIVITY): Troponin I (High Sensitivity): 116 ng/L (ref <18)

## 2022-07-18 LAB — RESP PANEL BY RT-PCR (RSV, FLU A&B, COVID)  RVPGX2
Influenza A by PCR: NEGATIVE
Influenza B by PCR: NEGATIVE
Resp Syncytial Virus by PCR: POSITIVE — AB
SARS Coronavirus 2 by RT PCR: NEGATIVE

## 2022-07-18 MED ORDER — INSULIN ASPART 100 UNIT/ML IJ SOLN
0.0000 [IU] | Freq: Three times a day (TID) | INTRAMUSCULAR | Status: DC
  Administered 2022-07-19: 1 [IU] via SUBCUTANEOUS
  Administered 2022-07-20: 2 [IU] via SUBCUTANEOUS
  Administered 2022-07-20: 1 [IU] via SUBCUTANEOUS
  Administered 2022-07-21: 2 [IU] via SUBCUTANEOUS

## 2022-07-18 MED ORDER — SODIUM CHLORIDE 0.9% FLUSH: 3.0000 mL | INTRAVENOUS | Status: DC | PRN

## 2022-07-18 MED ORDER — ACETAMINOPHEN 650 MG RE SUPP: 650.0000 mg | Freq: Four times a day (QID) | RECTAL | Status: DC | PRN

## 2022-07-18 MED ORDER — HYDRALAZINE HCL 20 MG/ML IJ SOLN: 10.0000 mg | Freq: Three times a day (TID) | INTRAMUSCULAR | Status: DC | PRN

## 2022-07-18 MED ORDER — DOXERCALCIFEROL 4 MCG/2ML IV SOLN
4.0000 ug | INTRAVENOUS | Status: DC
  Administered 2022-07-19: 4 ug via INTRAVENOUS
  Filled 2022-07-18 (×2): qty 2

## 2022-07-18 MED ORDER — HEPARIN SODIUM (PORCINE) 5000 UNIT/ML IJ SOLN
5000.0000 [IU] | Freq: Three times a day (TID) | INTRAMUSCULAR | Status: DC
  Administered 2022-07-19 – 2022-07-21 (×7): 5000 [IU] via SUBCUTANEOUS
  Filled 2022-07-18 (×7): qty 1

## 2022-07-18 MED ORDER — BUDESONIDE 0.25 MG/2ML IN SUSP
0.2500 mg | Freq: Two times a day (BID) | RESPIRATORY_TRACT | Status: DC
  Administered 2022-07-19 – 2022-07-21 (×3): 0.25 mg via RESPIRATORY_TRACT
  Filled 2022-07-18 (×4): qty 2

## 2022-07-18 MED ORDER — SODIUM CHLORIDE 0.9% FLUSH
3.0000 mL | Freq: Two times a day (BID) | INTRAVENOUS | Status: DC
  Administered 2022-07-19 – 2022-07-21 (×5): 3 mL via INTRAVENOUS

## 2022-07-18 MED ORDER — ACETAMINOPHEN 325 MG PO TABS: 650.0000 mg | ORAL_TABLET | Freq: Four times a day (QID) | ORAL | Status: DC | PRN

## 2022-07-18 MED ORDER — METHYLPREDNISOLONE SODIUM SUCC 125 MG IJ SOLR
125.0000 mg | Freq: Once | INTRAMUSCULAR | Status: AC
  Administered 2022-07-18: 125 mg via INTRAVENOUS
  Filled 2022-07-18: qty 2

## 2022-07-18 MED ORDER — SODIUM CHLORIDE 0.9 % IV SOLN: 250.0000 mL | INTRAVENOUS | Status: DC | PRN

## 2022-07-18 MED ORDER — PENTAFLUOROPROP-TETRAFLUOROETH EX AERO: 1.0000 | INHALATION_SPRAY | CUTANEOUS | Status: DC | PRN

## 2022-07-18 MED ORDER — DOXERCALCIFEROL 4 MCG/2ML IV SOLN: 4.0000 ug | INTRAVENOUS | Status: DC

## 2022-07-18 MED ORDER — ALBUTEROL SULFATE HFA 108 (90 BASE) MCG/ACT IN AERS
2.0000 | INHALATION_SPRAY | RESPIRATORY_TRACT | Status: DC | PRN
Start: 2022-07-18 — End: 2022-07-19

## 2022-07-18 MED ORDER — SODIUM CHLORIDE 0.9 % IV SOLN
125.0000 mg | INTRAVENOUS | Status: DC
  Administered 2022-07-19: 125 mg via INTRAVENOUS
  Filled 2022-07-18: qty 10

## 2022-07-18 MED ORDER — IOHEXOL 350 MG/ML SOLN
75.0000 mL | Freq: Once | INTRAVENOUS | Status: AC | PRN
  Administered 2022-07-18: 75 mL via INTRAVENOUS

## 2022-07-18 MED ORDER — FUROSEMIDE 10 MG/ML IJ SOLN
60.0000 mg | Freq: Once | INTRAMUSCULAR | Status: AC
  Administered 2022-07-18: 60 mg via INTRAVENOUS
  Filled 2022-07-18: qty 6

## 2022-07-18 MED ORDER — CHLORHEXIDINE GLUCONATE CLOTH 2 % EX PADS: 6.0000 | MEDICATED_PAD | Freq: Every day | CUTANEOUS | Status: DC

## 2022-07-18 MED ORDER — LIDOCAINE HCL (PF) 1 % IJ SOLN: 5.0000 mL | INTRAMUSCULAR | Status: DC | PRN

## 2022-07-18 MED ORDER — HEPARIN SODIUM (PORCINE) 1000 UNIT/ML DIALYSIS
2000.0000 [IU] | Freq: Once | INTRAMUSCULAR | Status: DC
  Filled 2022-07-18: qty 2

## 2022-07-18 MED ORDER — METHYLPREDNISOLONE SODIUM SUCC 125 MG IJ SOLR: 125.0000 mg | Freq: Every day | INTRAMUSCULAR | Status: DC

## 2022-07-18 MED ORDER — LIDOCAINE-PRILOCAINE 2.5-2.5 % EX CREA: 1.0000 | TOPICAL_CREAM | CUTANEOUS | Status: DC | PRN

## 2022-07-18 MED ORDER — IPRATROPIUM-ALBUTEROL 0.5-2.5 (3) MG/3ML IN SOLN: 3.0000 mL | RESPIRATORY_TRACT | Status: AC

## 2022-07-18 MED ORDER — NEPRO/CARBSTEADY PO LIQD: 237.0000 mL | ORAL | Status: DC | PRN

## 2022-07-18 NOTE — ED Triage Notes (Signed)
Coming from facility called out for respiratory distress at baseline no o2 normally walks around couldn't get out of bed pt so SOB on EMS arrival she was struggling to breathe. Facility placed on simple mask EMS Placed on 15L at that time NRB states bilateral rhales.  Denies CP now with O2 alert and oriented x 4.   HR 75 BP 166/67 O2 80's CGB 127 RR 24

## 2022-07-18 NOTE — ED Provider Notes (Addendum)
Pt seen by Dr Billy Fischer.  Please see her note.  Plan was to admit.  Patient with respiratory difficulty related to RSV and possible fluid overload.  CT scan does not show evidence of PE.  No acute findings noted on head CT.  Case discussed with Dr Aliene Beams, MD 07/18/22 1754    Dorie Rank, MD 07/18/22 [phone]

## 2022-07-18 NOTE — Assessment & Plan Note (Signed)
Continue statin. 

## 2022-07-18 NOTE — Assessment & Plan Note (Addendum)
62 year old female with acute onset of respiratory failure with hypoxia requiring 15L NRB now on 6L in setting of RSV -obs to progressive -possible volume overload contributing as well, but clinically does not appear that overloaded. Going for dialysis tonight -started on 20mg  of prednisone outpatient 3 days ago for a cough  -CTA negative for PE -? Acute on chronic diastolic chf: volume control per dialysis. Echo pending  -supportive therapy for RSV with scheduled pulmicort nebs, systemic steroids, SABA prn -IS to bedside -wean oxygen as tolerated

## 2022-07-18 NOTE — Assessment & Plan Note (Signed)
Initial troponin 116, delta pending. She has no chest pain or changes on EKG Likely demand ischemia in setting of acute respiratory failure Continue to monitor on telemetry and f/u on echo/delta troponin

## 2022-07-18 NOTE — Assessment & Plan Note (Signed)
Continue PPI ?

## 2022-07-18 NOTE — ED Notes (Signed)
Patient transported to CT 

## 2022-07-18 NOTE — Assessment & Plan Note (Signed)
Declines nicotine patch  

## 2022-07-18 NOTE — Assessment & Plan Note (Signed)
No recent A1C, pending. Appears to be diet controlled.  SSI and accuchecks qac/hs  

## 2022-07-18 NOTE — H&P (Signed)
History and Physical    Patient: KEIRSTON SAEPHANH HFW:[id number] DOB: 03-06-1961 DOA: 07/18/2022 DOS: the patient was seen and examined on 07/19/2022 PCP: Seward Carol, MD  Patient coming from: SNF - [facility]   Chief Complaint: respiratory distress   HPI: ALDEEN RIGA is a 62 y.o. female with medical history significant of ESRD on HD TTS, hx of CVA, HLD, HTN, O2DX, diastolic CHF, hypothyroidism, depression, GERD, ACD, chronic thrombocytopenia who presented to ED with complaints of weakness and respiratory distress.  Facility called due to respiratory distress and inability to get out of bed. Patient can' tell me why she is here and chart reviewed. She is sleepy and tachypneic during dialysis. Dialysis RN knows her and states she went out to smoke yesterday before dialysis and had a bad cough.  She denies any fever/chills, N/V, chest pain/palpitations, leg swelling. States she has a headache and her stomach hurts.   She smokes daily, denies alcohol.   ROS per above.    ER Course:  vitals: afebrile, bp: 166/69, HR: 73, RR: 24, pulse ox: 80%>15L NRB>[place] at 6L.  Pertinent labs: hgb: 9.9, creatinine: 3.56, troponin 116, bnp: 4488, +RSV,  Istat venous blood gas: pH: 7.358,  CXR: cardiomegaly and asymmetric elevation of the right hemidiaphragm. No focal consolidation/edema.  CTA chest: no PE. Small right pleural effusion. Fatty liver. Reflux of contrast into hepatic veins.  CT head: no acute findings In ED: nephrology consulted, given duoneb. Given 60mg  of lasix, steroids.     Review of Systems: As mentioned in the history of present illness. All other systems reviewed and are negative. Past Medical History:  Diagnosis Date   Ambulates with cane    Constipation    Depression    ESRD (end stage renal disease) ([place])    a. TTS Dialysis.   GERD (gastroesophageal reflux disease)    History of stress test    a. 01/2003 MV: EF 74%, no ischemia/infarct.   Hyperlipidemia    Hypertension     Osteoporosis    Stroke [facility]) 04-01-11   left frontal subcortical, saw Dr. [name]    Syncope 11/2019   TIA (transient ischemic attack) 03-12-11   Tobacco abuse    Tricuspid regurgitation    a. 05/2016 Echo: EF 65-70%, Gr2DD, mild MR, nl RV fxn, Triv TR, PASP 73mmHg; b. 05/2018 Echo: EF 60-65%, Gr2DD, mild MR/TR, RVSP/PASP 22mmHg; c. 11/2019 Echo: EF 55-60%, no rwma, mild-mod MR, Sev TR w/ RV dilatation (CTA chest neg for PE).   Type II diabetes mellitus ([place])    Vitamin D deficiency    Past Surgical History:  Procedure Laterality Date   AV FISTULA PLACEMENT Left 02/21/2019   Procedure: BRACHIOCEPHALIC ARTERIOVENOUS (AV) FISTULA CREATION;  Surgeon: [name], MD;  Location: [place];  Service: Vascular;  Laterality: Left;   [place] Left 04/11/2019   Procedure: SECOND STAGE BASILIC VEIN TRANSPOSITION LEFT ARM;  Surgeon: [name], MD;  Location: [place];  Service: Vascular;  Laterality: Left;   IR FLUORO GUIDE CV LINE RIGHT  04/29/2019   IR PARACENTESIS  02/20/2021   IR US GUIDE VASC ACCESS RIGHT  04/29/2019   OPEN REDUCTION INTERNAL FIXATION (ORIF) DISTAL RADIAL FRACTURE Left 01/28/2016   Procedure: OPEN REDUCTION INTERNAL FIXATION (ORIF) DISTAL RADIAL FRACTURE;  Surgeon: Iran Planas, MD;  Location: [place];  Service: Orthopedics;  Laterality: Left;   [place]   RIGHT HEART CATH N/A 12/05/2019  Procedure: RIGHT HEART CATH;  Surgeon: Nelva Bush, MD;  Location: [place] CV LAB;  Service: Cardiovascular;  Laterality: N/A;   TONSILLECTOMY AND ADENOIDECTOMY     age 23   Social History:  reports that she has been smoking cigarettes. She has a 9.60 pack-year smoking history. She has been exposed to tobacco smoke. She has never used smokeless tobacco. She reports that she does not drink alcohol and does not use drugs.  Allergies  Allergen Reactions   Hydrocodone Nausea And Vomiting and Other (See Comments)    "Allergic,"  per [facility]    Family History  Problem Relation Age of Onset   Stroke Mother    Diabetes Mother    Kidney failure Mother    Heart failure Mother    Stroke Father    Cancer Sister        Breast- 62's   Colon cancer Maternal Grandmother    Pancreatic cancer Neg Hx    Esophageal cancer Neg Hx    Liver disease Neg Hx    Stomach cancer Neg Hx     Prior to Admission medications   Medication Sig Start Date End Date Taking? Authorizing Provider  Amino Acids-Protein Hydrolys (FEEDING SUPPLEMENT, PRO-STAT SUGAR FREE 64,) LIQD Take 30 mLs by mouth in the morning. WILD CHERRY FLAVOR    [provider]  aspirin 81 MG chewable tablet Chew 81 mg by mouth daily.    [provider]  atorvastatin (LIPITOR) 40 MG tablet Take 40 mg by mouth at bedtime.     [provider]  b complex-vitamin c-folic acid (NEPHRO-VITE) 0.8 MG TABS tablet Take 1 tablet by mouth at bedtime.    [provider]  carvedilol (COREG) 6.25 MG tablet Take 1 tablet (6.25 mg total) by mouth 2 (two) times daily with a meal. 02/22/21   Thurnell Lose, MD  cloNIDine (CATAPRES) 0.1 MG tablet Take 0.1 mg by mouth every 8 (eight) hours as needed (BP>160/90).    [provider]  esomeprazole (NEXIUM) 40 MG capsule Take 40 mg by mouth daily.    [provider]  FLUoxetine (PROZAC) 20 MG capsule Take 20 mg by mouth daily. 12/21/20   [provider]  levothyroxine (SYNTHROID) 75 MCG tablet Take 1 tablet (75 mcg total) by mouth daily at 6 (six) AM. 02/02/21   Regalado, [name], MD  Lido-PE-Glycerin-Petrolatum (PREPARATION H RAPID RELIEF EX) Apply topically every 6 (six) hours as needed (hemorrhoid).    [provider]  lidocaine (L-M-X 4) 4 % cream Apply 1 application topically as needed. Apply to affected rectal area prior to dialysis. 03/29/21   Luna Fuse, MD  midodrine (PROAMATINE) 10 MG tablet Take 10 mg by mouth See admin instructions. Take 1 tablet (10 mg) by mouth  prior to dialysis on Tuesday, Thursday, Saturday    [provider]  Nutritional Supplements (NOVASOURCE RENAL) LIQD Take 237 mLs by mouth with breakfast, with lunch, and with evening meal.    [provider]  omega-3 acid ethyl esters (LOVAZA) 1 g capsule Take 2 capsules by mouth 2 (two) times daily. 05/28/20   [provider]  ondansetron (ZOFRAN) 4 MG tablet Take 4 mg by mouth every 8 (eight) hours as needed for nausea or vomiting.    [provider]  polyethylene glycol powder (GLYCOLAX/MIRALAX) 17 GM/SCOOP powder Take 17 g by mouth See admin instructions. Mix 17 grams of powder into 8 ounces of fluid, stir, and drink (by mouth) once a  day    [provider]  SACCHAROMYCES BOULARDII PO Take 1 capsule by mouth 2 (two) times daily.    [provider]  senna-docusate (SENOKOT-S) 8.6-50 MG tablet Take 1 tablet by mouth at bedtime as needed for mild constipation. 06/11/18   Regalado, [name], MD  sodium bicarbonate 650 MG tablet Take 1,300 mg by mouth 2 (two) times daily.    [provider]    Physical Exam: Vitals:   07/18/22 2224 07/18/22 2239 07/18/22 2254 07/18/22 2303  BP: (!) 168/62 (!) 137/56 (!) 155/57 (!) 165/64  Pulse: 66 65 62 68  Resp: 17 16 15 19   Temp:    (!) 97.5 F (36.4 C)  TempSrc:    Oral  SpO2: 100% 100% 100% 100%  Weight:      Height:       General:  Appears calm and comfortable and is in NAD. Sleepy.  Eyes:  PERRL, EOMI, normal lids, iris ENT:  grossly normal hearing, lips & tongue, mmm; poor dentition  Neck:  no LAD, masses or thyromegaly; no carotid bruits Cardiovascular:  RRR, no m/r/g. No LE edema.  Respiratory:   course breath sounds bilaterally with expiratory wheeze. No crackles. No nasal flaring, but belly breathing  Abdomen:  soft, NT, ND, NABS Back:   normal alignment, no CVAT Skin:  no rash or induration seen on limited exam Musculoskeletal:  grossly normal tone BUE/BLE, good ROM, no bony  abnormality Lower extremity:  No LE edema.  Limited foot exam with no ulcerations.  2+ distal pulses. Psychiatric:  grossly normal mood and affect, speech fluent and appropriate, AOx3 Neurologic:  CN 2-12 grossly intact, moves all extremities in coordinated fashion, sensation intact   Radiological Exams on Admission: Independently reviewed - see discussion in A/P where applicable  CT Angio Chest PE W and/or Wo Contrast  Result Date: 07/18/2022 CLINICAL DATA:  Shortness of breath.  Low O2 sats EXAM: CT ANGIOGRAPHY CHEST WITH CONTRAST TECHNIQUE: Multidetector CT imaging of the chest was performed using the standard protocol during bolus administration of intravenous contrast. Multiplanar CT image reconstructions and MIPs were obtained to evaluate the vascular anatomy. RADIATION DOSE REDUCTION: This exam was performed according to the departmental dose-optimization program which includes automated exposure control, adjustment of the mA and/or kV according to patient size and/or use of iterative reconstruction technique. CONTRAST:  17mL OMNIPAQUE IOHEXOL 350 MG/ML SOLN COMPARISON:  X-ray earlier 07/18/2022.  CT angiogram 02/18/2021 FINDINGS: Cardiovascular: Heart is enlarged. Trace pericardial fluid. Bovine type aortic arch. Grossly the thoracic aorta has a normal course and caliber with scattered partially calcified atherosclerotic plaque plaque also extends along the origin of the great vessels including the left subclavian artery. There is significant contrast refluxing into the IVC and hepatic veins. Please correlate with patient's clinical history. Significant breathing motion identified. This can limit evaluation of small peripheral emboli. No segmental or larger embolus identified. Mediastinum/Nodes: No specific abnormal lymph node enlargement seen in the axillary regions, hilum or mediastinum. Small thyroid gland. Mildly patulous thoracic esophagus. Lungs/Pleura: Small right pleural effusion. No  pneumothorax. Scattered areas of scar or atelectatic changes once again identified. The effusion was seen previously as well. There are areas of bronchial wall thickening identified along both lower lobes. Upper Abdomen: Fatty liver infiltration. Scattered ascites. Gallstone. Musculoskeletal: Degenerative changes seen along the spine. There are healed bilateral rib fractures identified. Please correlate with history. There are also healed upper sternal fracture. Presumed sebaceous cyst along the upper right back subcutaneous nodule  on series 6, image 31. Review of the MIP images confirms the above findings. IMPRESSION: Breathing motion.  No segmental or larger pulmonary embolism. Enlarged heart with reflux of contrast into the hepatic veins. Please correlate with liver failure. Anasarca.  Right small pleural effusion. Gallstone.  Fatty liver infiltration. Aortic Atherosclerosis (ICD10-I70.0). Electronically Signed   By: Jill Side M.D.   On: 07/18/2022 16:54   CT Head Wo Contrast  Result Date: 07/18/2022 CLINICAL DATA:  Mental status change, unknown cause EXAM: CT HEAD WITHOUT CONTRAST TECHNIQUE: Contiguous axial images were obtained from the base of the skull through the vertex without intravenous contrast. RADIATION DOSE REDUCTION: This exam was performed according to the departmental dose-optimization program which includes automated exposure control, adjustment of the mA and/or kV according to patient size and/or use of iterative reconstruction technique. COMPARISON:  04/17/2021 FINDINGS: Brain: No evidence of acute infarction, hemorrhage, hydrocephalus, extra-axial collection or mass lesion/mass effect. Chronic lacunar infarcts in the bilateral basal ganglia. Patchy low-density changes within the periventricular and subcortical white matter compatible with chronic microvascular ischemic change. Mild diffuse cerebral volume loss. Vascular: Atherosclerotic calcifications involving the large vessels of the  skull base. No unexpected hyperdense vessel. Skull: Normal. Negative for fracture or focal lesion. Sinuses/Orbits: Mucosal thickening of the ethmoid air cells. Otherwise clear. Other: None. IMPRESSION: 1. No acute intracranial findings. 2. Chronic microvascular ischemic change and cerebral volume loss. Electronically Signed   By: Davina Poke D.O.   On: 07/18/2022 16:54   DG Chest 1 View  Result Date: 07/18/2022 CLINICAL DATA:  Respiratory distress. EXAM: CHEST  1 VIEW COMPARISON:  Chest radiograph 04/09/2021 FINDINGS: The heart is enlarged, unchanged. The upper mediastinal contours are normal There is no evidence of overt pulmonary edema. There is no focal airspace opacity. There is no significant pleural effusion. There is no pneumothorax. There is unchanged asymmetric elevation of the right hemidiaphragm. There is no acute osseous abnormality. IMPRESSION: Unchanged cardiomegaly and asymmetric elevation of the right hemidiaphragm. No focal consolidation or evidence of overt pulmonary edema. Electronically Signed   By: Valetta Mole M.D.   On: 07/18/2022 13:54    EKG: Independently reviewed.  NSR with rate 72; nonspecific ST changes with no evidence of acute ischemia. PAC.    Labs on Admission: I have personally reviewed the available labs and imaging studies at the time of the admission.  Pertinent labs:   hgb: 9.9,  creatinine: 3.56,  troponin 116,  bnp: 4488,  +RSV,  Istat venous blood gas: pH: 7.358,  Assessment and Plan: Principal Problem:   Acute respiratory failure with hypoxia in setting of RSV Active Problems:   ESRD (end stage renal disease) ([facility])   Elevated troponin   Acute on chronic diastolic CHF (congestive heart failure) ([facility])   Hypothyroidism   Type II diabetes mellitus ([facility])   Essential hypertension   Anemia of chronic disease   GERD (gastroesophageal reflux disease)   Hyperlipidemia   Tobacco abuse   RSV (respiratory syncytial virus pneumonia)    Assessment  and Plan: * Acute respiratory failure with hypoxia in setting of RSV 62 year old female with acute onset of respiratory failure with hypoxia requiring 15L NRB now on 6L in setting of RSV -obs to progressive -possible volume overload contributing as well, but clinically does not appear that overloaded. Going for dialysis tonight -check limited US for ascites  -started on 20mg  of prednisone outpatient 3 days ago for a cough  -CTA negative for PE -Acute on chronic diastolic chf:  volume control per dialysis. Echo pending  -supportive therapy for RSV with scheduled pulmicort nebs, systemic steroids, SABA prn -IS to bedside -wean oxygen as tolerated   ESRD (end stage renal disease) ([place]) On dialysis TTS, had session yesterday last OP HD thursday 4 kg over the dry wt.  Nephrology consulted and plan for HD tonight  Appreciate consult   Elevated troponin Initial troponin 116, delta pending. She has no chest pain or changes on EKG Likely demand ischemia in setting of acute respiratory failure Continue to monitor on telemetry and f/u on echo/delta troponin   Acute on chronic diastolic CHF (congestive heart failure) ([place]) Does not appear overloaded on exam, but ? If some volume overload with not taking as much off during dialysis yesterday -bnp elevated -given 60mg  of lasix in ED as she still makes urine -strict I/O -volume control per dialysis and is in dialysis now -echo 09/2020 with normal EF and indeterminate diastolic pressures -repeat echo    Hypothyroidism Tsh abnormal in 2022 Repeat TSH/free T4 pending Continue home synthroid   Type II diabetes mellitus ([place]) No recent A1C, pending Appears to be diet controlled.  SSI and accuchecks qac/hs   Essential hypertension Continue coreg 6.25mg  BID  Mar still pending Hydralazine prn for elevated pressurs >180   Anemia of chronic disease Overdue for ESA, but holding per nephro Continue with IV Fe loading before HD through 2/1    GERD (gastroesophageal reflux disease) Continue PPI   Hyperlipidemia Continue statin   Tobacco abuse Declines  nicotine patch     Advance Care Planning:   Code Status: DNR   Consults: nephrology   DVT Prophylaxis: heparin   Family Communication: none   Severity of Illness: The appropriate patient status for this patient is OBSERVATION. Observation status is judged to be reasonable and necessary in order to provide the required intensity of service to ensure the patient's safety. The patient's presenting symptoms, physical exam findings, and initial radiographic and laboratory data in the context of their medical condition is felt to place them at decreased risk for further clinical deterioration. Furthermore, it is anticipated that the patient will be medically stable for discharge from the hospital within 2 midnights of admission.   Author: Orma Flaming, MD 07/19/2022 1:42 AM  For on call review [email or web address].

## 2022-07-18 NOTE — Assessment & Plan Note (Signed)
Tsh abnormal in 2022 Repeat TSH/free T4 pending Continue home synthroid

## 2022-07-18 NOTE — Consult Note (Signed)
Renal Service Consult Note [facility]  [name] Michael 07/18/2022 [name] Blazing, MD Requesting Physician: Dr Billy Fischer  Reason for Consult: ESRD pt w/ hypoxia HPI: The patient is a 62 y.o. year-old w/ PMH as below who presented to ED from SNF for resp distress. Not on O2 at baseline. In ED placed her on 15 L [place], rales noted. BP's and HR good. States she came off her last HD w/o getting "all the fluid off". In ED pt had cough, no CP. Labs came back neg for flu/ covid but + for RSV. CXR was not remarkable. Pt to be admitted, asked to see for dialysis.   Pt seen in ED bed. She is very weak but oriented and speaking okay. Coughing.   ROS - denies CP, no joint pain, no HA, no blurry vision, no rash, no dysuria, no difficulty voiding   Past Medical History  Past Medical History:  Diagnosis Date   Ambulates with cane    Constipation    Depression    ESRD (end stage renal disease) ([place])    a. TTS Dialysis.   GERD (gastroesophageal reflux disease)    History of stress test    a. 01/2003 MV: EF 74%, no ischemia/infarct.   Hyperlipidemia    Hypertension    Osteoporosis    Stroke [facility]) 04-01-11   left frontal subcortical, saw Dr. [name]    Syncope 11/2019   TIA (transient ischemic attack) 03-12-11   Tobacco abuse    Tricuspid regurgitation    a. 05/2016 Echo: EF 65-70%, Gr2DD, mild MR, nl RV fxn, Triv TR, PASP 49mmHg; b. 05/2018 Echo: EF 60-65%, Gr2DD, mild MR/TR, RVSP/PASP 57mmHg; c. 11/2019 Echo: EF 55-60%, no rwma, mild-mod MR, Sev TR w/ RV dilatation (CTA chest neg for PE).   Type II diabetes mellitus (HCC)    Vitamin D deficiency    Past Surgical History  Past Surgical History:  Procedure Laterality Date   AV FISTULA PLACEMENT Left 02/21/2019   Procedure: BRACHIOCEPHALIC ARTERIOVENOUS (AV) FISTULA CREATION;  Surgeon: [name], MD;  Location: [place];  Service: Vascular;  Laterality: Left;   [place] Left 04/11/2019   Procedure: SECOND  STAGE BASILIC VEIN TRANSPOSITION LEFT ARM;  Surgeon: [name], MD;  Location: [place];  Service: Vascular;  Laterality: Left;   IR FLUORO GUIDE CV LINE RIGHT  04/29/2019   IR PARACENTESIS  02/20/2021   IR US GUIDE VASC ACCESS RIGHT  04/29/2019   OPEN REDUCTION INTERNAL FIXATION (ORIF) DISTAL RADIAL FRACTURE Left 01/28/2016   Procedure: OPEN REDUCTION INTERNAL FIXATION (ORIF) DISTAL RADIAL FRACTURE;  Surgeon: Iran Planas, MD;  Location: [place];  Service: Orthopedics;  Laterality: Left;   [place]   RIGHT HEART CATH N/A 12/05/2019   Procedure: RIGHT HEART CATH;  Surgeon: Nelva Bush, MD;  Location: [place] CV LAB;  Service: Cardiovascular;  Laterality: N/A;   TONSILLECTOMY AND ADENOIDECTOMY     age 11   Family History  Family History  Problem Relation Age of Onset   Stroke Mother    Diabetes Mother    Kidney failure Mother    Heart failure Mother    Stroke Father    Cancer Sister        Breast- 27's   Colon cancer Maternal Grandmother    Pancreatic cancer Neg Hx    Esophageal cancer Neg Hx    Liver disease Neg Hx    Stomach cancer Neg  Hx    Social History  reports that she has been smoking cigarettes. She has a 9.60 pack-year smoking history. She has been exposed to tobacco smoke. She has never used smokeless tobacco. She reports that she does not drink alcohol and does not use drugs. Allergies  Allergies  Allergen Reactions   Hydrocodone Nausea And Vomiting and Other (See Comments)    "Allergic," per [facility]   Home medications Prior to Admission medications   Medication Sig Start Date End Date Taking? Authorizing Provider  Amino Acids-Protein Hydrolys (FEEDING SUPPLEMENT, PRO-STAT SUGAR FREE 64,) LIQD Take 30 mLs by mouth in the morning. WILD CHERRY FLAVOR    [provider]  aspirin 81 MG chewable tablet Chew 81 mg by mouth daily.    [provider]  atorvastatin (LIPITOR) 40 MG tablet Take 40 mg by mouth at  bedtime.     [provider]  b complex-vitamin c-folic acid (NEPHRO-VITE) 0.8 MG TABS tablet Take 1 tablet by mouth at bedtime.    [provider]  carvedilol (COREG) 6.25 MG tablet Take 1 tablet (6.25 mg total) by mouth 2 (two) times daily with a meal. 02/22/21   Thurnell Lose, MD  cloNIDine (CATAPRES) 0.1 MG tablet Take 0.1 mg by mouth every 8 (eight) hours as needed (BP>160/90).    [provider]  esomeprazole (NEXIUM) 40 MG capsule Take 40 mg by mouth daily.    [provider]  FLUoxetine (PROZAC) 20 MG capsule Take 20 mg by mouth daily. 12/21/20   [provider]  levothyroxine (SYNTHROID) 75 MCG tablet Take 1 tablet (75 mcg total) by mouth daily at 6 (six) AM. 02/02/21   Regalado, [name], MD  Lido-PE-Glycerin-Petrolatum (PREPARATION H RAPID RELIEF EX) Apply topically every 6 (six) hours as needed (hemorrhoid).    [provider]  lidocaine (L-M-X 4) 4 % cream Apply 1 application topically as needed. Apply to affected rectal area prior to dialysis. 03/29/21   Luna Fuse, MD  midodrine (PROAMATINE) 10 MG tablet Take 10 mg by mouth See admin instructions. Take 1 tablet (10 mg) by mouth prior to dialysis on Tuesday, Thursday, Saturday    [provider]  Nutritional Supplements (NOVASOURCE RENAL) LIQD Take 237 mLs by mouth with breakfast, with lunch, and with evening meal.    [provider]  omega-3 acid ethyl esters (LOVAZA) 1 g capsule Take 2 capsules by mouth 2 (two) times daily. 05/28/20   [provider]  ondansetron (ZOFRAN) 4 MG tablet Take 4 mg by mouth every 8 (eight) hours as needed for nausea or vomiting.    [provider]  polyethylene glycol powder (GLYCOLAX/MIRALAX) 17 GM/SCOOP powder Take 17 g by mouth See admin instructions. Mix 17 grams of powder into 8 ounces of fluid, stir, and drink (by mouth) once a day    [provider]  SACCHAROMYCES BOULARDII PO Take 1 capsule by mouth 2  (two) times daily.    [provider]  senna-docusate (SENOKOT-S) 8.6-50 MG tablet Take 1 tablet by mouth at bedtime as needed for mild constipation. 06/11/18   Regalado, [name], MD  sodium bicarbonate 650 MG tablet Take 1,300 mg by mouth 2 (two) times daily.    [provider]     Vitals:   07/18/22 1425 07/18/22 1430 07/18/22 1600 07/18/22 1658  BP: (!) 172/75 (!) 177/70 (!) 176/72   Pulse: 71 72 78   Resp: (!) 22  (!) 25   Temp:    Marland Kitchen)  97.4 F (36.3 C)  TempSrc:    Oral  SpO2: 96% 97% 96%   Weight:      Height:       Exam Gen awake, frail and appears fatigued No rash, cyanosis or gangrene Sclera anicteric, throat clear  No jvd or bruits Chest diffuse mild exp wheezing +scattered rales/ rhonchi bilat RRR no MRG Abd soft ntnd no mass or ascites +bs GU defer MS no joint effusions or deformity Ext no LE or UE edema, no wounds or ulcers Neuro as above.     LUA AVF+ bruit     Home meds include - asa, lipitor, nephrovite, coreg 6.25 bid, clonidine 0.1 tid, nexium, prozac, synthroid, midodrine 10mg  pre hd tts, lovaza, sacc boulardii, sod bicarb 2 bid, prns/ vits/ supps    OP HD: [place] TTS  3h 63min  350/500  32.4kg  2/2 bath  LUA AVF   Heparin 2000 - last HD 1/25, post hd wt 36.1 kg (3.7kg up) - venofer 100mg  tiw IV thru 2/01 - hectorol 4 mcg IV tts - mircera 30 mcg IV q4 wks, last 12/09, due 1/06 (3 wks ago)  Assessment/ Plan: AHRF - w/ hypoxia and resp distress sent from SNF to ED. CXR w/o sig edema, but pt did come off last OP HD thursday 4 kg over the dry wt. BP's are up. Trop is up and waiting for CTA lungs to be completed before we do dialysis (trop was ^'d). Max UF goal 3.5 L w/ HD tonight as tolerated.  ESRD - on HD TTS. Last HD yesterday. HD tonight as above.  HTN/ volume - as above Anemia esrd - Hb 10-11 here, is overdue for esa but will hold unless Hb drops. Will cont IV fe loading w/ each HD thru 2/01.  MBD ckd - CCa in range, cont IV vdra.  Add on phos, get binder info.  H/o CVA      Rob Maelie Chriswell  MD 07/18/2022, 5:12 PM Recent Labs  Lab 07/18/22 1323 07/18/22 1330  HGB 9.9* 11.2*  ALBUMIN 2.9*  --   CALCIUM 7.9*  --   CREATININE 3.56* 3.80*  K 3.8 3.6   Inpatient medications:  doxercalciferol  4 mcg Intravenous Q T,Th,Sa-HD   heparin  2,000 Units Dialysis Once in dialysis    albuterol, feeding supplement (NEPRO CARB STEADY), lidocaine (PF), lidocaine-prilocaine, pentafluoroprop-tetrafluoroeth

## 2022-07-18 NOTE — Assessment & Plan Note (Addendum)
Overdue for ESA, but holding per nephro Continue with IV Fe loading before HD through 2/1

## 2022-07-18 NOTE — ED Notes (Signed)
MD aware of BP IV medication PRN placed pt is in route HD bed placement aware and pt will have a bed before the end of HD and will not come back to the ED

## 2022-07-18 NOTE — ED Provider Notes (Signed)
[facility] Provider Note   CSN: [id number] Arrival date & time: 07/18/22  1244     History  Chief Complaint  Patient presents with   Respiratory Distress    [name] Michael is a 62 y.o. female.  HPI      62 year old female with a history of ESRD on dialysis Tuesday Thursday Saturday, hypertension, hyperlipidemia, TIA, CVA, type 2 diabetes, tricuspid regurgitation, protein caloric malnutrition, depression, constipation, under palliative care who presents from facility with concern for shortness of breath.  History is limited by acuity of condition, she is sleepy, tachypneic.    Reports shortness of breath began yesterday.  She states she went to dialysis yesterday but they did not take off all of the fluid (Dr. Jonnie Finner confirms she was 4kg over yesterday). She is not clear why they did not remove it all. She answers yes and no questions.  Reports she has had cough> Denies chest pain. Denies nausea, vomiting, fevers, leg swelling. Saw palliative yesterday. Confirmed DNR.      Past Medical History:  Diagnosis Date   Ambulates with cane    Constipation    Depression    ESRD (end stage renal disease) ([name] Michael)    a. TTS Dialysis.   GERD (gastroesophageal reflux disease)    History of stress test    a. 01/2003 MV: EF 74%, no ischemia/infarct.   Hyperlipidemia    Hypertension    Osteoporosis    Stroke [facility]) 04-01-11   left frontal subcortical, saw Dr. [name]    Syncope 11/2019   TIA (transient ischemic attack) 03-12-11   Tobacco abuse    Tricuspid regurgitation    a. 05/2016 Echo: EF 65-70%, Gr2DD, mild MR, nl RV fxn, Triv TR, PASP 66mmHg; b. 05/2018 Echo: EF 60-65%, Gr2DD, mild MR/TR, RVSP/PASP 45mmHg; c. 11/2019 Echo: EF 55-60%, no rwma, mild-mod MR, Sev TR w/ RV dilatation (CTA chest neg for PE).   Type II diabetes mellitus ([name] Michael)    Vitamin D deficiency      Home Medications Prior to Admission medications   Medication Sig Start  Date End Date Taking? Authorizing Provider  Amino Acids-Protein Hydrolys (FEEDING SUPPLEMENT, PRO-STAT SUGAR FREE 64,) LIQD Take 30 mLs by mouth in the morning. WILD CHERRY FLAVOR    [provider]  aspirin 81 MG chewable tablet Chew 81 mg by mouth daily.    [provider]  atorvastatin (LIPITOR) 40 MG tablet Take 40 mg by mouth at bedtime.     [provider]  b complex-vitamin c-folic acid (NEPHRO-VITE) 0.8 MG TABS tablet Take 1 tablet by mouth at bedtime.    [provider]  carvedilol (COREG) 6.25 MG tablet Take 1 tablet (6.25 mg total) by mouth 2 (two) times daily with a meal. 02/22/21   Thurnell Lose, MD  cloNIDine (CATAPRES) 0.1 MG tablet Take 0.1 mg by mouth every 8 (eight) hours as needed (BP>160/90).    [provider]  esomeprazole (NEXIUM) 40 MG capsule Take 40 mg by mouth daily.    [provider]  FLUoxetine (PROZAC) 20 MG capsule Take 20 mg by mouth daily. 12/21/20   [provider]  levothyroxine (SYNTHROID) 75 MCG tablet Take 1 tablet (75 mcg total) by mouth daily at 6 (six) AM. 02/02/21   Regalado, [name], MD  Lido-PE-Glycerin-Petrolatum (PREPARATION H RAPID RELIEF EX) Apply topically every 6 (six) hours as needed (hemorrhoid).    [provider]  lidocaine (L-M-X 4) 4 %  cream Apply 1 application topically as needed. Apply to affected rectal area prior to dialysis. 03/29/21   Luna Fuse, MD  midodrine (PROAMATINE) 10 MG tablet Take 10 mg by mouth See admin instructions. Take 1 tablet (10 mg) by mouth prior to dialysis on Tuesday, Thursday, Saturday    [provider]  Nutritional Supplements (NOVASOURCE RENAL) LIQD Take 237 mLs by mouth with breakfast, with lunch, and with evening meal.    [provider]  omega-3 acid ethyl esters (LOVAZA) 1 g capsule Take 2 capsules by mouth 2 (two) times daily. 05/28/20   [provider]  ondansetron (ZOFRAN) 4 MG tablet Take 4 mg by mouth every  8 (eight) hours as needed for nausea or vomiting.    [provider]  polyethylene glycol powder (GLYCOLAX/MIRALAX) 17 GM/SCOOP powder Take 17 g by mouth See admin instructions. Mix 17 grams of powder into 8 ounces of fluid, stir, and drink (by mouth) once a day    [provider]  SACCHAROMYCES BOULARDII PO Take 1 capsule by mouth 2 (two) times daily.    [provider]  senna-docusate (SENOKOT-S) 8.6-50 MG tablet Take 1 tablet by mouth at bedtime as needed for mild constipation. 06/11/18   Regalado, [name], MD  sodium bicarbonate 650 MG tablet Take 1,300 mg by mouth 2 (two) times daily.    [provider]      Allergies    Hydrocodone    Review of Systems   Review of Systems  Physical Exam Updated Vital Signs Temp 98.2 F (36.8 C) (Oral)   Ht 4\' 6"  (1.372 m)   Wt 36.3 kg   LMP 03/06/2013 (LMP Unknown)   SpO2 100%   BMI 19.30 kg/m  Physical Exam Vitals and nursing note reviewed.  Constitutional:      General: She is not in acute distress.    Appearance: She is well-developed. She is ill-appearing and toxic-appearing. She is not diaphoretic.  HENT:     Head: Normocephalic and atraumatic.  Eyes:     Conjunctiva/sclera: Conjunctivae normal.  Neck:     Comments: +JVD  Cardiovascular:     Rate and Rhythm: Normal rate and regular rhythm.     Heart sounds: Normal heart sounds. No murmur heard.    No friction rub. No gallop.  Pulmonary:     Effort: Pulmonary effort is normal. No respiratory distress.     Breath sounds: Wheezing and rhonchi present. No rales.  Abdominal:     General: There is distension.     Palpations: Abdomen is soft.     Tenderness: There is no abdominal tenderness. There is no guarding.  Musculoskeletal:        General: No tenderness.     Cervical back: Normal range of motion.  Skin:    General: Skin is warm and dry.     Findings: No erythema or rash.  Neurological:     Mental Status: She is alert and oriented to  person, place, and time.     ED Results / Procedures / Treatments   Labs (all labs ordered are listed, but only abnormal results are displayed) Labs Reviewed - No data to display  EKG None  Radiology No results found.  Procedures Procedures    Medications Ordered in ED Medications  albuterol (VENTOLIN HFA) 108 (90 Base) MCG/ACT inhaler 2 puff (has no administration in time range)    ED Course/ Medical Decision Making/ A&P      62 year old female with  a history of ESRD on dialysis Tuesday Thursday Saturday, hypertension, hyperlipidemia, TIA, CVA, type 2 diabetes, tricuspid regurgitation, protein caloric malnutrition, depression, constipation, smoking history, under palliative care who presents from facility with concern for shortness of breath with acute hypoxic respiratory failure.  Differential diagnosis for dyspnea includes ACS, PE, COPD exacerbation, CHF exacerbation, anemia, pneumonia, viral etiology such as COVID 19 infection, metabolic abnormality, volume overload/needing dialysis.    Initially called Nephrology regarding concern for volume overload and planned for dialysis, however found to have troponin 116 ( prior values 2022 13-60), positive RSV, and will plan on CT PE study prior to dialysis given other possible etiologies of hypoxic respiratory failure need to be assessed prior to dialysis.   Chest x-ray was done and evaluated by me which showed no significant change in comparison to prior.   EKG was evaluated by me which showed no significant change in comparison to prior. BNP was significant elevated in setting of ESRD.    Troponin 116. Consider ACS, however also consider PE as etiology OR stress related troponin elevation in setting of RSV bronchitis an  ESRD.   Plan on admission for further care however awaiting CT PE study. Will also order CT head given sleepiness.    Given solumedrol, duonebs for wheezing (no prior COPD hx, does have smoking hx), lasix  (reports makes urine).  Signed out to Dr. Tomi Bamberger with reassessment pending.        Final Clinical Impression(s) / ED Diagnoses Final diagnoses:  None    Rx / DC Orders ED Discharge Orders     None         Gareth Morgan, MD 07/18/22 1656

## 2022-07-18 NOTE — Assessment & Plan Note (Signed)
Continue coreg 6.25mg  BID  Mar still pending Hydralazine prn for elevated pressurs >180

## 2022-07-18 NOTE — ED Notes (Signed)
HD is notified that RSV positive and MD wants a head CT and PE study prior to going

## 2022-07-18 NOTE — Assessment & Plan Note (Signed)
Does not appear overloaded on exam, but ? If some volume overload with not taking as much off during dialysis yesterday -bnp elevated -given 60mg  of lasix in ED as she still makes urine -strict I/O -volume control per dialysis and is in dialysis now -echo 09/2020 with normal EF and indeterminate diastolic pressures -repeat echo

## 2022-07-18 NOTE — Assessment & Plan Note (Signed)
On dialysis TTS, had session yesterday last OP HD thursday 4 kg over the dry wt.  Nephrology consulted and plan for HD tonight  Appreciate consult

## 2022-07-19 ENCOUNTER — Observation Stay (HOSPITAL_COMMUNITY): Payer: Medicare Other

## 2022-07-19 DIAGNOSIS — Z66 Do not resuscitate: Secondary | ICD-10-CM | POA: Diagnosis present

## 2022-07-19 DIAGNOSIS — J121 Respiratory syncytial virus pneumonia: Secondary | ICD-10-CM | POA: Diagnosis present

## 2022-07-19 DIAGNOSIS — Z8673 Personal history of transient ischemic attack (TIA), and cerebral infarction without residual deficits: Secondary | ICD-10-CM | POA: Diagnosis not present

## 2022-07-19 DIAGNOSIS — I5031 Acute diastolic (congestive) heart failure: Secondary | ICD-10-CM | POA: Diagnosis not present

## 2022-07-19 DIAGNOSIS — K219 Gastro-esophageal reflux disease without esophagitis: Secondary | ICD-10-CM | POA: Diagnosis present

## 2022-07-19 DIAGNOSIS — Z1152 Encounter for screening for COVID-19: Secondary | ICD-10-CM | POA: Diagnosis not present

## 2022-07-19 DIAGNOSIS — Z992 Dependence on renal dialysis: Secondary | ICD-10-CM | POA: Diagnosis not present

## 2022-07-19 DIAGNOSIS — R0902 Hypoxemia: Secondary | ICD-10-CM | POA: Diagnosis present

## 2022-07-19 DIAGNOSIS — Z8249 Family history of ischemic heart disease and other diseases of the circulatory system: Secondary | ICD-10-CM | POA: Diagnosis not present

## 2022-07-19 DIAGNOSIS — N2581 Secondary hyperparathyroidism of renal origin: Secondary | ICD-10-CM | POA: Diagnosis present

## 2022-07-19 DIAGNOSIS — N186 End stage renal disease: Secondary | ICD-10-CM | POA: Diagnosis present

## 2022-07-19 DIAGNOSIS — E785 Hyperlipidemia, unspecified: Secondary | ICD-10-CM | POA: Diagnosis present

## 2022-07-19 DIAGNOSIS — F17210 Nicotine dependence, cigarettes, uncomplicated: Secondary | ICD-10-CM | POA: Diagnosis present

## 2022-07-19 DIAGNOSIS — I5033 Acute on chronic diastolic (congestive) heart failure: Secondary | ICD-10-CM | POA: Diagnosis present

## 2022-07-19 DIAGNOSIS — J9601 Acute respiratory failure with hypoxia: Secondary | ICD-10-CM | POA: Diagnosis present

## 2022-07-19 DIAGNOSIS — I132 Hypertensive heart and chronic kidney disease with heart failure and with stage 5 chronic kidney disease, or end stage renal disease: Secondary | ICD-10-CM | POA: Diagnosis present

## 2022-07-19 DIAGNOSIS — Z823 Family history of stroke: Secondary | ICD-10-CM | POA: Diagnosis not present

## 2022-07-19 DIAGNOSIS — E1122 Type 2 diabetes mellitus with diabetic chronic kidney disease: Secondary | ICD-10-CM | POA: Diagnosis present

## 2022-07-19 DIAGNOSIS — M810 Age-related osteoporosis without current pathological fracture: Secondary | ICD-10-CM | POA: Diagnosis present

## 2022-07-19 DIAGNOSIS — D631 Anemia in chronic kidney disease: Secondary | ICD-10-CM | POA: Diagnosis present

## 2022-07-19 DIAGNOSIS — E039 Hypothyroidism, unspecified: Secondary | ICD-10-CM | POA: Diagnosis present

## 2022-07-19 DIAGNOSIS — E559 Vitamin D deficiency, unspecified: Secondary | ICD-10-CM | POA: Diagnosis present

## 2022-07-19 DIAGNOSIS — Z79890 Hormone replacement therapy (postmenopausal): Secondary | ICD-10-CM | POA: Diagnosis not present

## 2022-07-19 DIAGNOSIS — I071 Rheumatic tricuspid insufficiency: Secondary | ICD-10-CM | POA: Diagnosis present

## 2022-07-19 DIAGNOSIS — Z7982 Long term (current) use of aspirin: Secondary | ICD-10-CM | POA: Diagnosis not present

## 2022-07-19 DIAGNOSIS — I2489 Other forms of acute ischemic heart disease: Secondary | ICD-10-CM | POA: Diagnosis present

## 2022-07-19 LAB — CBC
HCT: 33.9 % — ABNORMAL LOW (ref 36.0–46.0)
Hemoglobin: 10.6 g/dL — ABNORMAL LOW (ref 12.0–15.0)
MCH: 29.8 pg (ref 26.0–34.0)
MCHC: 31.3 g/dL (ref 30.0–36.0)
MCV: 95.2 fL (ref 80.0–100.0)
Platelets: 149 10*3/uL — ABNORMAL LOW (ref 150–400)
RBC: 3.56 MIL/uL — ABNORMAL LOW (ref 3.87–5.11)
RDW: 15.9 % — ABNORMAL HIGH (ref 11.5–15.5)
WBC: 3.5 10*3/uL — ABNORMAL LOW (ref 4.0–10.5)
nRBC: 0 % (ref 0.0–0.2)

## 2022-07-19 LAB — BASIC METABOLIC PANEL
Anion gap: 14 (ref 5–15)
BUN: 13 mg/dL (ref 8–23)
CO2: 25 mmol/L (ref 22–32)
Calcium: 8.1 mg/dL — ABNORMAL LOW (ref 8.9–10.3)
Chloride: 95 mmol/L — ABNORMAL LOW (ref 98–111)
Creatinine, Ser: 2.64 mg/dL — ABNORMAL HIGH (ref 0.44–1.00)
GFR, Estimated: 20 mL/min — ABNORMAL LOW (ref >60)
Glucose, Bld: 111 mg/dL — ABNORMAL HIGH (ref 70–99)
Potassium: 3.6 mmol/L (ref 3.5–5.1)
Sodium: 134 mmol/L — ABNORMAL LOW (ref 135–145)

## 2022-07-19 LAB — TROPONIN I (HIGH SENSITIVITY): Troponin I (High Sensitivity): 134 ng/L (ref <18)

## 2022-07-19 LAB — ECHOCARDIOGRAM COMPLETE
Area-P 1/2: 3.68 cm2
Calc EF: 57.7 %
Height: 54 in
S' Lateral: 2.6 cm
Single Plane A2C EF: 57.2 %
Single Plane A4C EF: 57.6 %
Weight: 1280.43 oz

## 2022-07-19 LAB — GLUCOSE, CAPILLARY
Glucose-Capillary: 104 mg/dL — ABNORMAL HIGH (ref 70–99)
Glucose-Capillary: 71 mg/dL (ref 70–99)
Glucose-Capillary: 161 mg/dL — ABNORMAL HIGH (ref 70–99)
Glucose-Capillary: 133 mg/dL — ABNORMAL HIGH (ref 70–99)

## 2022-07-19 LAB — MAGNESIUM: Magnesium: 1.8 mg/dL (ref 1.7–2.4)

## 2022-07-19 LAB — HEMOGLOBIN A1C
Hgb A1c MFr Bld: 5.7 % — ABNORMAL HIGH (ref 4.8–5.6)
Mean Plasma Glucose: 116.89 mg/dL

## 2022-07-19 LAB — HIV ANTIBODY (ROUTINE TESTING W REFLEX): HIV Screen 4th Generation wRfx: REACTIVE — AB

## 2022-07-19 LAB — PHOSPHORUS: Phosphorus: 4.2 mg/dL (ref 2.5–4.6)

## 2022-07-19 LAB — T4, FREE: Free T4: 1.43 ng/dL — ABNORMAL HIGH (ref 0.61–1.12)

## 2022-07-19 LAB — TSH: TSH: 4.97 u[IU]/mL — ABNORMAL HIGH (ref 0.350–4.500)

## 2022-07-19 MED ORDER — HEPARIN SODIUM (PORCINE) 1000 UNIT/ML DIALYSIS: 2000.0000 [IU] | INTRAMUSCULAR | Status: DC | PRN

## 2022-07-19 MED ORDER — CARVEDILOL 6.25 MG PO TABS
6.2500 mg | ORAL_TABLET | Freq: Two times a day (BID) | ORAL | Status: DC
  Administered 2022-07-19 – 2022-07-21 (×4): 6.25 mg via ORAL
  Filled 2022-07-19 (×4): qty 1

## 2022-07-19 MED ORDER — HEPARIN SODIUM (PORCINE) 1000 UNIT/ML IJ SOLN
INTRAMUSCULAR | Status: AC
  Filled 2022-07-19: qty 2

## 2022-07-19 MED ORDER — HEPARIN SODIUM (PORCINE) 1000 UNIT/ML DIALYSIS
2000.0000 [IU] | INTRAMUSCULAR | Status: AC | PRN
  Administered 2022-07-19: 2000 [IU] via INTRAVENOUS_CENTRAL

## 2022-07-19 MED ORDER — IPRATROPIUM-ALBUTEROL 0.5-2.5 (3) MG/3ML IN SOLN
3.0000 mL | Freq: Two times a day (BID) | RESPIRATORY_TRACT | Status: DC
  Administered 2022-07-19 – 2022-07-21 (×3): 3 mL via RESPIRATORY_TRACT
  Filled 2022-07-19 (×4): qty 3

## 2022-07-19 MED ORDER — METHYLPREDNISOLONE SODIUM SUCC 40 MG IJ SOLR
40.0000 mg | Freq: Every day | INTRAMUSCULAR | Status: DC
  Administered 2022-07-19 – 2022-07-21 (×3): 40 mg via INTRAVENOUS
  Filled 2022-07-19 (×3): qty 1

## 2022-07-19 MED ORDER — ALBUTEROL SULFATE (2.5 MG/3ML) 0.083% IN NEBU: 2.5000 mg | INHALATION_SOLUTION | RESPIRATORY_TRACT | Status: DC | PRN

## 2022-07-19 MED ORDER — ASPIRIN 81 MG PO CHEW
81.0000 mg | CHEWABLE_TABLET | Freq: Every day | ORAL | Status: DC
  Administered 2022-07-19 – 2022-07-21 (×3): 81 mg via ORAL
  Filled 2022-07-19 (×3): qty 1

## 2022-07-19 MED ORDER — HEPARIN SODIUM (PORCINE) 1000 UNIT/ML DIALYSIS: 2000.0000 [IU] | Freq: Once | INTRAMUSCULAR | Status: DC

## 2022-07-19 MED ORDER — LEVOTHYROXINE SODIUM 75 MCG PO TABS
75.0000 ug | ORAL_TABLET | Freq: Every day | ORAL | Status: DC
  Administered 2022-07-19 – 2022-07-21 (×3): 75 ug via ORAL
  Filled 2022-07-19 (×3): qty 1

## 2022-07-19 MED ORDER — PANTOPRAZOLE SODIUM 40 MG PO TBEC
80.0000 mg | DELAYED_RELEASE_TABLET | Freq: Every day | ORAL | Status: DC
  Administered 2022-07-19 – 2022-07-21 (×3): 80 mg via ORAL
  Filled 2022-07-19 (×3): qty 2

## 2022-07-19 MED ORDER — HEPARIN SODIUM (PORCINE) 1000 UNIT/ML DIALYSIS
2000.0000 [IU] | Freq: Once | INTRAMUSCULAR | Status: DC
  Filled 2022-07-19 (×2): qty 2

## 2022-07-19 NOTE — Procedures (Signed)
HD Note:  Some information was entered later than the data was gathered due to patient care needs. The stated time with the data is accurate.Received patient in bed to unit.  Alert and oriented.  Informed consent signed and in chart.   Access used: Left upper arm fistual Access issues: None     [name]

## 2022-07-19 NOTE — Progress Notes (Signed)
Echocardiogram 2D Echocardiogram has been performed.  [name] Michael 07/19/2022, 12:52 PM

## 2022-07-19 NOTE — Progress Notes (Signed)
[facility] Progress Note   Subjective:Seen in room. Denies SOB. Asking for lunch tray to be set up.      HD yesterday. Net UF only 2 liters. Post wt not documented.   Objective Vitals:   07/19/22 0600 07/19/22 0822 07/19/22 0835 07/19/22 1219  BP:  (!) 156/62  (!) 155/64  Pulse: 78 81  73  Resp:  16  18  Temp:  97.9 F (36.6 C)  98.6 F (37 C)  TempSrc:  Oral  Oral  SpO2: 99% 96% 96% 92%  Weight:      Height:       Physical Exam General: Chronically ill appearing female in NAD Heart: S1,S2 No M/R/G SR no ectopy.  Lungs: Slightly decreased in bases otherwise CTAB. No WOB.  Abdomen: NABS, NT Extremities: No LE edema Dialysis Access: L AVF +T/B   Additional Objective Labs: Basic Metabolic Panel: Recent Labs  Lab 07/18/22 1323 07/18/22 1330 07/18/22 1800 07/19/22 0117  NA 136 135 132* 134*  K 3.8 3.6 5.6* 3.6  CL 93* 92*  --  95*  CO2 30  --   --  25  GLUCOSE 81 81  --  111*  BUN 16 18  --  13  CREATININE 3.56* 3.80*  --  2.64*  CALCIUM 7.9*  --   --  8.1*  PHOS  --   --   --  4.2   Liver Function Tests: Recent Labs  Lab 07/18/22 1323  AST 31  ALT 13  ALKPHOS 87  BILITOT 0.7  PROT 5.9*  ALBUMIN 2.9*   No results for input(s): "LIPASE", "AMYLASE" in the last 168 hours. CBC: Recent Labs  Lab 07/18/22 1323 07/18/22 1330 07/18/22 1800 07/19/22 0117  WBC 4.9  --   --  3.5*  NEUTROABS 3.8  --   --   --   HGB 9.9* 11.2* 12.9 10.6*  HCT 29.9* 33.0* 38.0 33.9*  MCV 93.1  --   --  95.2  PLT 166  --   --  149*   Blood Culture    Component Value Date/Time   SDES URINE, CLEAN CATCH 02/17/2021 1403   SPECREQUEST  02/17/2021 1403    NONE Performed at [facility], [place] [address], [place], [place] [address]    CULT 40,000 COLONIES/mL ENTEROCOCCUS FAECALIS (A) 02/17/2021 1403   REPTSTATUS 02/19/2021 FINAL 02/17/2021 1403    Cardiac Enzymes: No results for input(s): "CKTOTAL", "CKMB", "CKMBINDEX", "TROPONINI" in the last 168  hours. CBG: Recent Labs  Lab 07/19/22 0116 07/19/22 0822 07/19/22 1218  GLUCAP 104* 71 161*   Iron Studies: No results for input(s): "IRON", "TIBC", "TRANSFERRIN", "FERRITIN" in the last 72 hours. @lablastinr3 @ Studies/Results: [name] ASCITES (ABDOMEN LIMITED)  Result Date: 07/19/2022 CLINICAL DATA:  Evaluate for ascites EXAM: LIMITED ABDOMEN ULTRASOUND FOR ASCITES TECHNIQUE: Limited ultrasound survey for ascites was performed in all four abdominal quadrants. COMPARISON:  None Available. FINDINGS: A small amount of ascites is identified in all 4 quadrants but is most prominent adjacent to the liver. IMPRESSION: Small amount of ascites in all 4 quadrants. Electronically Signed   By: Dorise Bullion III M.D.   On: 07/19/2022 08:58   CT Angio Chest PE W and/or Wo Contrast  Result Date: 07/18/2022 CLINICAL DATA:  Shortness of breath.  Low O2 sats EXAM: CT ANGIOGRAPHY CHEST WITH CONTRAST TECHNIQUE: Multidetector CT imaging of the chest was performed using the standard protocol during bolus administration of intravenous contrast. Multiplanar CT image reconstructions and  MIPs were obtained to evaluate the vascular anatomy. RADIATION DOSE REDUCTION: This exam was performed according to the departmental dose-optimization program which includes automated exposure control, adjustment of the mA and/or kV according to patient size and/or use of iterative reconstruction technique. CONTRAST:  16mL OMNIPAQUE IOHEXOL 350 MG/ML SOLN COMPARISON:  X-ray earlier 07/18/2022.  CT angiogram 02/18/2021 FINDINGS: Cardiovascular: Heart is enlarged. Trace pericardial fluid. Bovine type aortic arch. Grossly the thoracic aorta has a normal course and caliber with scattered partially calcified atherosclerotic plaque plaque also extends along the origin of the great vessels including the left subclavian artery. There is significant contrast refluxing into the IVC and hepatic veins. Please correlate with patient's clinical history.  Significant breathing motion identified. This can limit evaluation of small peripheral emboli. No segmental or larger embolus identified. Mediastinum/Nodes: No specific abnormal lymph node enlargement seen in the axillary regions, hilum or mediastinum. Small thyroid gland. Mildly patulous thoracic esophagus. Lungs/Pleura: Small right pleural effusion. No pneumothorax. Scattered areas of scar or atelectatic changes once again identified. The effusion was seen previously as well. There are areas of bronchial wall thickening identified along both lower lobes. Upper Abdomen: Fatty liver infiltration. Scattered ascites. Gallstone. Musculoskeletal: Degenerative changes seen along the spine. There are healed bilateral rib fractures identified. Please correlate with history. There are also healed upper sternal fracture. Presumed sebaceous cyst along the upper right back subcutaneous nodule on series 6, image 31. Review of the MIP images confirms the above findings. IMPRESSION: Breathing motion.  No segmental or larger pulmonary embolism. Enlarged heart with reflux of contrast into the hepatic veins. Please correlate with liver failure. Anasarca.  Right small pleural effusion. Gallstone.  Fatty liver infiltration. Aortic Atherosclerosis (ICD10-I70.0). Electronically Signed   By: Jill Side M.D.   On: 07/18/2022 16:54   CT Head Wo Contrast  Result Date: 07/18/2022 CLINICAL DATA:  Mental status change, unknown cause EXAM: CT HEAD WITHOUT CONTRAST TECHNIQUE: Contiguous axial images were obtained from the base of the skull through the vertex without intravenous contrast. RADIATION DOSE REDUCTION: This exam was performed according to the departmental dose-optimization program which includes automated exposure control, adjustment of the mA and/or kV according to patient size and/or use of iterative reconstruction technique. COMPARISON:  04/17/2021 FINDINGS: Brain: No evidence of acute infarction, hemorrhage, hydrocephalus,  extra-axial collection or mass lesion/mass effect. Chronic lacunar infarcts in the bilateral basal ganglia. Patchy low-density changes within the periventricular and subcortical white matter compatible with chronic microvascular ischemic change. Mild diffuse cerebral volume loss. Vascular: Atherosclerotic calcifications involving the large vessels of the skull base. No unexpected hyperdense vessel. Skull: Normal. Negative for fracture or focal lesion. Sinuses/Orbits: Mucosal thickening of the ethmoid air cells. Otherwise clear. Other: None. IMPRESSION: 1. No acute intracranial findings. 2. Chronic microvascular ischemic change and cerebral volume loss. Electronically Signed   By: Davina Poke D.O.   On: 07/18/2022 16:54   DG Chest 1 View  Result Date: 07/18/2022 CLINICAL DATA:  Respiratory distress. EXAM: CHEST  1 VIEW COMPARISON:  Chest radiograph 04/09/2021 FINDINGS: The heart is enlarged, unchanged. The upper mediastinal contours are normal There is no evidence of overt pulmonary edema. There is no focal airspace opacity. There is no significant pleural effusion. There is no pneumothorax. There is unchanged asymmetric elevation of the right hemidiaphragm. There is no acute osseous abnormality. IMPRESSION: Unchanged cardiomegaly and asymmetric elevation of the right hemidiaphragm. No focal consolidation or evidence of overt pulmonary edema. Electronically Signed   By: Valetta Mole M.D.   On:  07/18/2022 13:54   Medications:  sodium chloride     ferric gluconate (FERRLECIT) IVPB      aspirin  81 mg Oral Daily   budesonide (PULMICORT) nebulizer solution  0.25 mg Nebulization BID   carvedilol  6.25 mg Oral BID WC   doxercalciferol  4 mcg Intravenous Q T,Th,Sa-HD   [START ON 07/20/2022] heparin  2,000 Units Dialysis Once in dialysis   heparin  5,000 Units Subcutaneous Q8H   insulin aspart  0-6 Units Subcutaneous TID WC   ipratropium-albuterol  3 mL Nebulization BID   levothyroxine  75 mcg Oral Q0600    methylPREDNISolone (SOLU-MEDROL) injection  40 mg Intravenous Daily   pantoprazole  80 mg Oral Q1200   sodium chloride flush  3 mL Intravenous Q12H     OP HD: [facility]  3h 45min  350/500  32.4kg  2/2 bath  LUA AVF   Heparin 2000 - last HD 1/25, post hd wt 36.1 kg (3.7kg up) - venofer 100mg  tiw IV thru 2/01 - hectorol 4 mcg IV tts - mircera 30 mcg IV q4 wks, last 12/09, due 1/06 (3 wks ago)   Assessment/ Plan: AHRF - w/ hypoxia and resp distress sent from SNF to ED. CXR w/o sig edema, but pt did come off last OP HD thursday 4 kg over the dry wt. HD yesterday off schedule only removed 2 liters. Back to HD to resume T,Th,S schedule and continue lowering volume as tolerated.  HTN/ volume - as above Anemia esrd - Hb 10-11 here, is overdue for esa but will hold unless Hb drops. Will cont IV fe loading w/ each HD thru 2/01.  MBD ckd - CCa in range, cont IV vdra. Add on phos, get binder info.  H/o CVA  Taneka Espiritu H. Roosevelt Eimers NP-C 07/19/2022, 1:52 PM  [employer] [phone]

## 2022-07-19 NOTE — Progress Notes (Signed)
PROGRESS NOTE    [name] Michael  AOZ:[id number] DOB: June 18, 1961 DOA: 07/18/2022 PCP: Seward Carol, MD   Brief Narrative: 62 year old female with past medical history significant for ESRD on hemodialysis TTS, history of CVA, hyperlipidemia, hypertension, diabetes type 2, diastolic heart failure, hypothyroidism, depression, GERD, ACD, chronic thrombocytopenia presents to the ED complaining of weakness and respiratory distress.  Patient was referred from skilled nursing facility due to patient having respiratory distress and inability to get out of the bed.  Patient is not a good historian.  [name] in the ED patient was found to be positive for RSV, chest x-ray cardiomegaly and asymmetric elevation of the right hemidiaphragm.  CTA chest: No PE, small right pleural effusion.  Fatty liver.  Reflux of contrast into the hepatic vein.  Patient admitted with acute hypoxic respiratory failure.  Assessment & Plan:   Principal Problem:   Acute respiratory failure with hypoxia in setting of RSV Active Problems:   ESRD (end stage renal disease) (HCC)   Elevated troponin   Acute on chronic diastolic CHF (congestive heart failure) (HCC)   Hypothyroidism   Type II diabetes mellitus ([place])   Essential hypertension   Anemia of chronic disease   GERD (gastroesophageal reflux disease)   Hyperlipidemia   Tobacco abuse   RSV (respiratory syncytial virus pneumonia)   1-Acute Hypoxic Respiratory Failure in the setting of RSV: Patient initially presented requiring 15 L nonrebreather mask.  Currently transition to 6 L of oxygen. RSV positive. Also volume overload--- underwent hemodialysis 1/26 Continue with nebulizer, Pulmicort, IV steroids.  Oxygen requirement down to 2 L.   2-ESRD on hemodialysis, TTS Patient presented with volume overload.  Underwent hemodialysis on admission 1 8/26 Appreciate Nephrology assistance.    Elevation of troponin: Likely demand in the setting of acute respiratory  failure ECHO; Normal EF  Acute on chronic diastolic heart failure exacerbation: She is 4 kg over dry weight. Underwent hemodialysis 1/26  Hypothyroidism: Continue home Synthroid TSH mildly elevated, free T4 mildly elevated  Type 2 diabetes Continue with a sliding scale insulin  Essential hypertension: Continue with Coreg  Anemia of chronic disease: -Monitor hb.   GERD: Continue with PPI  Hyperlipidemia: Continue with the statins  Tobacco abuse   Pressure Injury 02/16/21 Sacrum Circumferential Stage 2 -  Partial thickness loss of dermis presenting as a shallow open injury with a red, pink wound bed without slough. (Active)  02/16/21 1801  Location: Sacrum  Location Orientation: Circumferential  Staging: Stage 2 -  Partial thickness loss of dermis presenting as a shallow open injury with a red, pink wound bed without slough.  Wound Description (Comments):   Present on Admission: Yes      Estimated body mass index is 19.3 kg/m as calculated from the following:   Height as of this encounter: 4\' 6"  (1.372 m).   Weight as of this encounter: 36.3 kg.   DVT prophylaxis: Heparin  Code Status: DNR Family Communication:  Disposition Plan:  Status is: Observation The patient will require care spanning > 2 midnights and should be moved to inpatient because: management of Resp failure    Consultants:  Nephrology   Procedures:  HD  Antimicrobials:    Subjective: She is feeling some what better. Report cough.asking for breakfast   Objective: Vitals:   07/18/22 2254 07/18/22 2303 07/19/22 0415 07/19/22 0600  BP: (!) 155/57 (!) 165/64 (!) 165/71   Pulse: 62 68 81 78  Resp: 15 19 16    Temp:  (!) 97.5 F (  36.4 C) 97.9 F (36.6 C)   TempSrc:  Oral Oral   SpO2: 100% 100% 100% 99%  Weight:      Height:        Intake/Output Summary (Last 24 hours) at 07/19/2022 5035 Last data filed at 07/18/2022 2330 Gross per 24 hour  Intake --  Output 2000 ml  Net -2000 ml    Filed Weights   07/18/22 1254  Weight: 36.3 kg    Examination:  General exam: Appears calm and comfortable  Respiratory system: BL ronchus.  Cardiovascular system: S1 & S2 heard, RRR.  Gastrointestinal system: Abdomen is nondistended, soft and nontender. No organomegaly or masses felt. Normal bowel sounds heard. Central nervous system: Alert and oriented. No focal neurological deficits. Extremities: Symmetric 5 x 5 power.   Data Reviewed: I have personally reviewed following labs and imaging studies  CBC: Recent Labs  Lab 07/18/22 1323 07/18/22 1330 07/18/22 1800 07/19/22 0117  WBC 4.9  --   --  3.5*  NEUTROABS 3.8  --   --   --   HGB 9.9* 11.2* 12.9 10.6*  HCT 29.9* 33.0* 38.0 33.9*  MCV 93.1  --   --  95.2  PLT 166  --   --  465*   Basic Metabolic Panel: Recent Labs  Lab 07/18/22 1323 07/18/22 1330 07/18/22 1800 07/19/22 0117  NA 136 135 132* 134*  K 3.8 3.6 5.6* 3.6  CL 93* 92*  --  95*  CO2 30  --   --  25  GLUCOSE 81 81  --  111*  BUN 16 18  --  13  CREATININE 3.56* 3.80*  --  2.64*  CALCIUM 7.9*  --   --  8.1*  MG  --   --   --  1.8  PHOS  --   --   --  4.2   GFR: Estimated Creatinine Clearance: 11.2 mL/min (A) (by C-G formula based on SCr of 2.64 mg/dL (H)). Liver Function Tests: Recent Labs  Lab 07/18/22 1323  AST 31  ALT 13  ALKPHOS 87  BILITOT 0.7  PROT 5.9*  ALBUMIN 2.9*   No results for input(s): "LIPASE", "AMYLASE" in the last 168 hours. No results for input(s): "AMMONIA" in the last 168 hours. Coagulation Profile: No results for input(s): "INR", "PROTIME" in the last 168 hours. Cardiac Enzymes: No results for input(s): "CKTOTAL", "CKMB", "CKMBINDEX", "TROPONINI" in the last 168 hours. BNP (last 3 results) No results for input(s): "PROBNP" in the last 8760 hours. HbA1C: Recent Labs    07/19/22 0117  HGBA1C 5.7*   CBG: Recent Labs  Lab 07/19/22 0116  GLUCAP 104*   Lipid Profile: No results for input(s): "CHOL", "HDL",  "LDLCALC", "TRIG", "CHOLHDL", "LDLDIRECT" in the last 72 hours. Thyroid Function Tests: Recent Labs    07/19/22 0117  TSH 4.970*  FREET4 1.43*   Anemia Panel: No results for input(s): "VITAMINB12", "FOLATE", "FERRITIN", "TIBC", "IRON", "RETICCTPCT" in the last 72 hours. Sepsis Labs: Recent Labs  Lab 07/18/22 1323 07/18/22 1721  LATICACIDVEN 1.4 1.3    Recent Results (from the past 240 hour(s))  Resp panel by RT-PCR (RSV, Flu A&B, Covid) Anterior Nasal Swab     Status: Abnormal   Collection Time: 07/18/22  1:23 PM   Specimen: Anterior Nasal Swab  Result Value Ref Range Status   SARS Coronavirus 2 by RT PCR NEGATIVE NEGATIVE Final    Comment: (NOTE) SARS-CoV-2 target nucleic acids are NOT DETECTED.  The SARS-CoV-2 RNA is generally  detectable in upper respiratory specimens during the acute phase of infection. The lowest concentration of SARS-CoV-2 viral copies this assay can detect is 138 copies/mL. A negative result does not preclude SARS-Cov-2 infection and should not be used as the sole basis for treatment or other patient management decisions. A negative result may occur with  improper specimen collection/handling, submission of specimen other than nasopharyngeal swab, presence of viral mutation(s) within the areas targeted by this assay, and inadequate number of viral copies(<138 copies/mL). A negative result must be combined with clinical observations, patient history, and epidemiological information. The expected result is Negative.  Fact Sheet for Patients:  EntrepreneurPulse.com.au  Fact Sheet for Healthcare Providers:  IncredibleEmployment.be  This test is no t yet approved or cleared by the [place] FDA and  has been authorized for detection and/or diagnosis of SARS-CoV-2 by FDA under an Emergency Use Authorization (EUA). This EUA will remain  in effect (meaning this test can be used) for the duration of the COVID-19  declaration under Section 564(b)(1) of the Act, 21 U.S.C.section 360bbb-3(b)(1), unless the authorization is terminated  or revoked sooner.       Influenza A by PCR NEGATIVE NEGATIVE Final   Influenza B by PCR NEGATIVE NEGATIVE Final    Comment: (NOTE) The Xpert Xpress SARS-CoV-2/FLU/RSV plus assay is intended as an aid in the diagnosis of influenza from Nasopharyngeal swab specimens and should not be used as a sole basis for treatment. Nasal washings and aspirates are unacceptable for Xpert Xpress SARS-CoV-2/FLU/RSV testing.  Fact Sheet for Patients: EntrepreneurPulse.com.au  Fact Sheet for Healthcare Providers: IncredibleEmployment.be  This test is not yet approved or cleared by the [place] FDA and has been authorized for detection and/or diagnosis of SARS-CoV-2 by FDA under an Emergency Use Authorization (EUA). This EUA will remain in effect (meaning this test can be used) for the duration of the COVID-19 declaration under Section 564(b)(1) of the Act, 21 U.S.C. section 360bbb-3(b)(1), unless the authorization is terminated or revoked.     Resp Syncytial Virus by PCR POSITIVE (A) NEGATIVE Final    Comment: (NOTE) Fact Sheet for Patients: EntrepreneurPulse.com.au  Fact Sheet for Healthcare Providers: IncredibleEmployment.be  This test is not yet approved or cleared by the [place] FDA and has been authorized for detection and/or diagnosis of SARS-CoV-2 by FDA under an Emergency Use Authorization (EUA). This EUA will remain in effect (meaning this test can be used) for the duration of the COVID-19 declaration under Section 564(b)(1) of the Act, 21 U.S.C. section 360bbb-3(b)(1), unless the authorization is terminated or revoked.  Performed at [facility], [place] [address], [place], [place] [address]          Radiology Studies: CT Angio Chest PE W and/or Wo Contrast  Result  Date: 07/18/2022 CLINICAL DATA:  Shortness of breath.  Low O2 sats EXAM: CT ANGIOGRAPHY CHEST WITH CONTRAST TECHNIQUE: Multidetector CT imaging of the chest was performed using the standard protocol during bolus administration of intravenous contrast. Multiplanar CT image reconstructions and MIPs were obtained to evaluate the vascular anatomy. RADIATION DOSE REDUCTION: This exam was performed according to the departmental dose-optimization program which includes automated exposure control, adjustment of the mA and/or kV according to patient size and/or use of iterative reconstruction technique. CONTRAST:  53mL OMNIPAQUE IOHEXOL 350 MG/ML SOLN COMPARISON:  X-ray earlier 07/18/2022.  CT angiogram 02/18/2021 FINDINGS: Cardiovascular: Heart is enlarged. Trace pericardial fluid. Bovine type aortic arch. Grossly the thoracic aorta has a normal course and caliber with scattered  partially calcified atherosclerotic plaque plaque also extends along the origin of the great vessels including the left subclavian artery. There is significant contrast refluxing into the IVC and hepatic veins. Please correlate with patient's clinical history. Significant breathing motion identified. This can limit evaluation of small peripheral emboli. No segmental or larger embolus identified. Mediastinum/Nodes: No specific abnormal lymph node enlargement seen in the axillary regions, hilum or mediastinum. Small thyroid gland. Mildly patulous thoracic esophagus. Lungs/Pleura: Small right pleural effusion. No pneumothorax. Scattered areas of scar or atelectatic changes once again identified. The effusion was seen previously as well. There are areas of bronchial wall thickening identified along both lower lobes. Upper Abdomen: Fatty liver infiltration. Scattered ascites. Gallstone. Musculoskeletal: Degenerative changes seen along the spine. There are healed bilateral rib fractures identified. Please correlate with history. There are also healed  upper sternal fracture. Presumed sebaceous cyst along the upper right back subcutaneous nodule on series 6, image 31. Review of the MIP images confirms the above findings. IMPRESSION: Breathing motion.  No segmental or larger pulmonary embolism. Enlarged heart with reflux of contrast into the hepatic veins. Please correlate with liver failure. Anasarca.  Right small pleural effusion. Gallstone.  Fatty liver infiltration. Aortic Atherosclerosis (ICD10-I70.0). Electronically Signed   By: Jill Side M.D.   On: 07/18/2022 16:54   CT Head Wo Contrast  Result Date: 07/18/2022 CLINICAL DATA:  Mental status change, unknown cause EXAM: CT HEAD WITHOUT CONTRAST TECHNIQUE: Contiguous axial images were obtained from the base of the skull through the vertex without intravenous contrast. RADIATION DOSE REDUCTION: This exam was performed according to the departmental dose-optimization program which includes automated exposure control, adjustment of the mA and/or kV according to patient size and/or use of iterative reconstruction technique. COMPARISON:  04/17/2021 FINDINGS: Brain: No evidence of acute infarction, hemorrhage, hydrocephalus, extra-axial collection or mass lesion/mass effect. Chronic lacunar infarcts in the bilateral basal ganglia. Patchy low-density changes within the periventricular and subcortical white matter compatible with chronic microvascular ischemic change. Mild diffuse cerebral volume loss. Vascular: Atherosclerotic calcifications involving the large vessels of the skull base. No unexpected hyperdense vessel. Skull: Normal. Negative for fracture or focal lesion. Sinuses/Orbits: Mucosal thickening of the ethmoid air cells. Otherwise clear. Other: None. IMPRESSION: 1. No acute intracranial findings. 2. Chronic microvascular ischemic change and cerebral volume loss. Electronically Signed   By: Davina Poke D.O.   On: 07/18/2022 16:54   DG Chest 1 View  Result Date: 07/18/2022 CLINICAL DATA:   Respiratory distress. EXAM: CHEST  1 VIEW COMPARISON:  Chest radiograph 04/09/2021 FINDINGS: The heart is enlarged, unchanged. The upper mediastinal contours are normal There is no evidence of overt pulmonary edema. There is no focal airspace opacity. There is no significant pleural effusion. There is no pneumothorax. There is unchanged asymmetric elevation of the right hemidiaphragm. There is no acute osseous abnormality. IMPRESSION: Unchanged cardiomegaly and asymmetric elevation of the right hemidiaphragm. No focal consolidation or evidence of overt pulmonary edema. Electronically Signed   By: Valetta Mole M.D.   On: 07/18/2022 13:54        Scheduled Meds:  aspirin  81 mg Oral Daily   budesonide (PULMICORT) nebulizer solution  0.25 mg Nebulization BID   carvedilol  6.25 mg Oral BID WC   Chlorhexidine Gluconate Cloth  6 each Topical Q0600   doxercalciferol  4 mcg Intravenous Q T,Th,Sa-HD   [START ON 07/20/2022] heparin  2,000 Units Dialysis Once in dialysis   heparin  5,000 Units Subcutaneous Q8H   insulin aspart  0-6 Units Subcutaneous TID WC   levothyroxine  75 mcg Oral Q0600   methylPREDNISolone (SOLU-MEDROL) injection  125 mg Intravenous Daily   pantoprazole  80 mg Oral Q1200   sodium chloride flush  3 mL Intravenous Q12H   Continuous Infusions:  sodium chloride     ferric gluconate (FERRLECIT) IVPB       LOS: 0 days    Time spent: 35 Minutes.     Elmarie Shiley, MD [facility]   If 7PM-7AM, please contact night-coverage [email or web address]  07/19/2022, 6:52 AM

## 2022-07-20 DIAGNOSIS — J9601 Acute respiratory failure with hypoxia: Secondary | ICD-10-CM | POA: Diagnosis not present

## 2022-07-20 LAB — GLUCOSE, CAPILLARY
Glucose-Capillary: 127 mg/dL — ABNORMAL HIGH (ref 70–99)
Glucose-Capillary: 244 mg/dL — ABNORMAL HIGH (ref 70–99)
Glucose-Capillary: 173 mg/dL — ABNORMAL HIGH (ref 70–99)
Glucose-Capillary: 169 mg/dL — ABNORMAL HIGH (ref 70–99)

## 2022-07-20 NOTE — Progress Notes (Addendum)
[name] Michael Progress Note   Subjective: Seen in room in bed in NAD. Very sleepy, HD late last night. Net UF 3.2 L. No Post wt.     Objective Vitals:   07/19/22 2103 07/19/22 2234 07/20/22 0421 07/20/22 0733  BP: (!) 164/65 (!) 155/71 (!) 157/59   Pulse: 80 78 70   Resp: 18 20 16 18   Temp: 98.6 F (37 C) 99.4 F (37.4 C) 98.1 F (36.7 C) 97.7 F (36.5 C)  TempSrc: Oral Oral Oral Oral  SpO2: 100% 96% 96%   Weight:      Height:       Physical Exam General: Chronically ill appearing female in NAD Heart: S1,S2 No M/R/G SR 70-80s.  Lungs: Slightly decreased in bases otherwise CTAB. No WOB.  Abdomen: NABS, NT Extremities: No LE edema Dialysis Access: L AVF +T/B   Additional Objective Labs: Basic Metabolic Panel: Recent Labs  Lab 07/18/22 1323 07/18/22 1330 07/18/22 1800 07/19/22 0117  NA 136 135 132* 134*  K 3.8 3.6 5.6* 3.6  CL 93* 92*  --  95*  CO2 30  --   --  25  GLUCOSE 81 81  --  111*  BUN 16 18  --  13  CREATININE 3.56* 3.80*  --  2.64*  CALCIUM 7.9*  --   --  8.1*  PHOS  --   --   --  4.2   Liver Function Tests: Recent Labs  Lab 07/18/22 1323  AST 31  ALT 13  ALKPHOS 87  BILITOT 0.7  PROT 5.9*  ALBUMIN 2.9*   No results for input(s): "LIPASE", "AMYLASE" in the last 168 hours. CBC: Recent Labs  Lab 07/18/22 1323 07/18/22 1330 07/18/22 1800 07/19/22 0117  WBC 4.9  --   --  3.5*  NEUTROABS 3.8  --   --   --   HGB 9.9* 11.2* 12.9 10.6*  HCT 29.9* 33.0* 38.0 33.9*  MCV 93.1  --   --  95.2  PLT 166  --   --  149*   Blood Culture    Component Value Date/Time   SDES URINE, CLEAN CATCH 02/17/2021 1403   SPECREQUEST  02/17/2021 1403    NONE Performed at [facility], [place] [address], [place], [place] [address]    CULT 40,000 COLONIES/mL ENTEROCOCCUS FAECALIS (A) 02/17/2021 1403   REPTSTATUS 02/19/2021 FINAL 02/17/2021 1403    Cardiac Enzymes: No results for input(s): "CKTOTAL", "CKMB", "CKMBINDEX", "TROPONINI" in the  last 168 hours. CBG: Recent Labs  Lab 07/19/22 0116 07/19/22 0822 07/19/22 1218 07/19/22 2232 07/20/22 0731  GLUCAP 104* 71 161* 133* 127*   Iron Studies: No results for input(s): "IRON", "TIBC", "TRANSFERRIN", "FERRITIN" in the last 72 hours. @lablastinr3 @ Studies/Results: ECHOCARDIOGRAM COMPLETE  Result Date: 07/19/2022    ECHOCARDIOGRAM REPORT   Patient Name:   [name] Michael Date of Exam: 07/19/2022 Medical Rec #:  [id number]      Height:       54.0 in Accession #:    [id number]     Weight:       80.0 lb Date of Birth:  1961/05/01      BSA:          1.172 m Patient Age:    62 years       BP:           157/64 mmHg Patient Gender: F              HR:  74 bpm. Exam Location:  Inpatient Procedure: 2D Echo, Cardiac Doppler and Color Doppler Indications:    K93.81 Acute diastolic (congestive) heart failure  History:        Patient has prior history of Echocardiogram examinations. Risk                 Factors:Diabetes, Dyslipidemia and Hypertension.  Sonographer:    [name] Michael Referring Phys: [id number] [name] Michael IMPRESSIONS  1. TV leaflets do not coapt; wide open TR; therefore cannot estimate PA pressure.  2. Left ventricular ejection fraction, by estimation, is 60 to 65%. The left ventricle has normal function. The left ventricle has no regional wall motion abnormalities. Left ventricular diastolic parameters were normal. There is the interventricular septum is flattened in systole and diastole, consistent with right ventricular pressure and volume overload.  3. Right ventricular systolic function is normal. The right ventricular size is severely enlarged.  4. Left atrial size was mildly dilated.  5. Right atrial size was severely dilated.  6. The mitral valve is normal in structure. Mild mitral valve regurgitation. No evidence of mitral stenosis.  7. 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  8. The aortic valve is tricuspid. Aortic valve regurgitation is not visualized. No aortic stenosis  is present.  9. The inferior vena cava is dilated in size with <50% respiratory variability, suggesting right atrial pressure of 15 mmHg. FINDINGS  Left Ventricle: Left ventricular ejection fraction, by estimation, is 60 to 65%. The left ventricle has normal function. The left ventricle has no regional wall motion abnormalities. The left ventricular internal cavity size was normal in size. There is  no left ventricular hypertrophy. The interventricular septum is flattened in systole and diastole, consistent with right ventricular pressure and volume overload. Left ventricular diastolic parameters were normal. Right Ventricle: The right ventricular size is severely enlarged. Right ventricular systolic function is normal. Left Atrium: Left atrial size was mildly dilated. Right Atrium: Right atrial size was severely dilated. Pericardium: There is no evidence of pericardial effusion. Mitral Valve: The mitral valve is normal in structure. Mild mitral valve regurgitation. No evidence of mitral valve stenosis. Tricuspid Valve: The tricuspid valve is normal in structure. 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 No evidence of tricuspid stenosis. Aortic Valve: The aortic valve is tricuspid. Aortic valve regurgitation is not visualized. No aortic stenosis is present. Pulmonic Valve: The pulmonic valve was normal in structure. Pulmonic valve regurgitation is trivial. No evidence of pulmonic stenosis. Aorta: The aortic root is normal in size and structure. Venous: The inferior vena cava is dilated in size with less than 50% respiratory variability, suggesting right atrial pressure of 15 mmHg. IAS/Shunts: No atrial level shunt detected by color flow Doppler. Additional Comments: TV leaflets do not coapt; wide open TR; therefore cannot estimate PA pressure.  LEFT VENTRICLE PLAX 2D LVIDd:         4.40 cm      Diastology LVIDs:         2.60 cm      LV e' medial:    8.81 cm/s LV PW:         0.90 cm      LV E/e' medial:  9.6 LV IVS:         0.90 cm      LV e' lateral:   12.60 cm/s LVOT diam:     1.60 cm      LV E/e' lateral: 6.7 LV SV:         45 LV SV Index:  38 LVOT Area:     2.01 cm  LV Volumes (MOD) LV vol d, MOD A2C: 106.0 ml LV vol d, MOD A4C: 100.0 ml LV vol s, MOD A2C: 45.4 ml LV vol s, MOD A4C: 42.4 ml LV SV MOD A2C:     60.6 ml LV SV MOD A4C:     100.0 ml LV SV MOD BP:      60.4 ml RIGHT VENTRICLE             IVC RV Basal diam:  5.60 cm     IVC diam: 2.30 cm RV S prime:     11.50 cm/s TAPSE (M-mode): 2.0 cm LEFT ATRIUM             Index        RIGHT ATRIUM           Index LA diam:        3.40 cm 2.90 cm/m   RA Area:     21.60 cm LA Vol (A2C):   45.9 ml 39.17 ml/m  RA Volume:   65.60 ml  55.98 ml/m LA Vol (A4C):   38.2 ml 32.60 ml/m LA Biplane Vol: 42.6 ml 36.35 ml/m  AORTIC VALVE LVOT Vmax:   104.00 cm/s LVOT Vmean:  70.900 cm/s LVOT VTI:    0.222 m  AORTA Ao Root diam: 2.50 cm Ao Asc diam:  2.70 cm MITRAL VALVE               TRICUSPID VALVE MV Area (PHT): 3.68 cm    TR Peak grad:   16.2 mmHg MV Decel Time: 206 msec    TR Vmax:        201.00 cm/s MV E velocity: 84.40 cm/s MV A velocity: 80.70 cm/s  SHUNTS MV E/A ratio:  1.05        Systemic VTI:  0.22 m                            Systemic Diam: 1.60 cm Kirk Ruths MD Electronically signed by Kirk Ruths MD Signature Date/Time: 07/19/2022/2:30:03 PM    Final    [name] ASCITES (ABDOMEN LIMITED)  Result Date: 07/19/2022 CLINICAL DATA:  Evaluate for ascites EXAM: LIMITED ABDOMEN ULTRASOUND FOR ASCITES TECHNIQUE: Limited ultrasound survey for ascites was performed in all four abdominal quadrants. COMPARISON:  None Available. FINDINGS: A small amount of ascites is identified in all 4 quadrants but is most prominent adjacent to the liver. IMPRESSION: Small amount of ascites in all 4 quadrants. Electronically Signed   By: Dorise Bullion III M.D.   On: 07/19/2022 08:58   CT Angio Chest PE W and/or Wo Contrast  Result Date: 07/18/2022 CLINICAL DATA:  Shortness of breath.  Low  O2 sats EXAM: CT ANGIOGRAPHY CHEST WITH CONTRAST TECHNIQUE: Multidetector CT imaging of the chest was performed using the standard protocol during bolus administration of intravenous contrast. Multiplanar CT image reconstructions and MIPs were obtained to evaluate the vascular anatomy. RADIATION DOSE REDUCTION: This exam was performed according to the departmental dose-optimization program which includes automated exposure control, adjustment of the mA and/or kV according to patient size and/or use of iterative reconstruction technique. CONTRAST:  79mL OMNIPAQUE IOHEXOL 350 MG/ML SOLN COMPARISON:  X-ray earlier 07/18/2022.  CT angiogram 02/18/2021 FINDINGS: Cardiovascular: Heart is enlarged. Trace pericardial fluid. Bovine type aortic arch. Grossly the thoracic aorta has a normal course and caliber with scattered partially calcified atherosclerotic plaque plaque also extends along  the origin of the great vessels including the left subclavian artery. There is significant contrast refluxing into the IVC and hepatic veins. Please correlate with patient's clinical history. Significant breathing motion identified. This can limit evaluation of small peripheral emboli. No segmental or larger embolus identified. Mediastinum/Nodes: No specific abnormal lymph node enlargement seen in the axillary regions, hilum or mediastinum. Small thyroid gland. Mildly patulous thoracic esophagus. Lungs/Pleura: Small right pleural effusion. No pneumothorax. Scattered areas of scar or atelectatic changes once again identified. The effusion was seen previously as well. There are areas of bronchial wall thickening identified along both lower lobes. Upper Abdomen: Fatty liver infiltration. Scattered ascites. Gallstone. Musculoskeletal: Degenerative changes seen along the spine. There are healed bilateral rib fractures identified. Please correlate with history. There are also healed upper sternal fracture. Presumed sebaceous cyst along the upper  right back subcutaneous nodule on series 6, image 31. Review of the MIP images confirms the above findings. IMPRESSION: Breathing motion.  No segmental or larger pulmonary embolism. Enlarged heart with reflux of contrast into the hepatic veins. Please correlate with liver failure. Anasarca.  Right small pleural effusion. Gallstone.  Fatty liver infiltration. Aortic Atherosclerosis (ICD10-I70.0). Electronically Signed   By: Jill Side M.D.   On: 07/18/2022 16:54   CT Head Wo Contrast  Result Date: 07/18/2022 CLINICAL DATA:  Mental status change, unknown cause EXAM: CT HEAD WITHOUT CONTRAST TECHNIQUE: Contiguous axial images were obtained from the base of the skull through the vertex without intravenous contrast. RADIATION DOSE REDUCTION: This exam was performed according to the departmental dose-optimization program which includes automated exposure control, adjustment of the mA and/or kV according to patient size and/or use of iterative reconstruction technique. COMPARISON:  04/17/2021 FINDINGS: Brain: No evidence of acute infarction, hemorrhage, hydrocephalus, extra-axial collection or mass lesion/mass effect. Chronic lacunar infarcts in the bilateral basal ganglia. Patchy low-density changes within the periventricular and subcortical white matter compatible with chronic microvascular ischemic change. Mild diffuse cerebral volume loss. Vascular: Atherosclerotic calcifications involving the large vessels of the skull base. No unexpected hyperdense vessel. Skull: Normal. Negative for fracture or focal lesion. Sinuses/Orbits: Mucosal thickening of the ethmoid air cells. Otherwise clear. Other: None. IMPRESSION: 1. No acute intracranial findings. 2. Chronic microvascular ischemic change and cerebral volume loss. Electronically Signed   By: Davina Poke D.O.   On: 07/18/2022 16:54   DG Chest 1 View  Result Date: 07/18/2022 CLINICAL DATA:  Respiratory distress. EXAM: CHEST  1 VIEW COMPARISON:  Chest  radiograph 04/09/2021 FINDINGS: The heart is enlarged, unchanged. The upper mediastinal contours are normal There is no evidence of overt pulmonary edema. There is no focal airspace opacity. There is no significant pleural effusion. There is no pneumothorax. There is unchanged asymmetric elevation of the right hemidiaphragm. There is no acute osseous abnormality. IMPRESSION: Unchanged cardiomegaly and asymmetric elevation of the right hemidiaphragm. No focal consolidation or evidence of overt pulmonary edema. Electronically Signed   By: Valetta Mole M.D.   On: 07/18/2022 13:54   Medications:  sodium chloride     ferric gluconate (FERRLECIT) IVPB Stopped (07/19/22 2228)    aspirin  81 mg Oral Daily   budesonide (PULMICORT) nebulizer solution  0.25 mg Nebulization BID   carvedilol  6.25 mg Oral BID WC   doxercalciferol  4 mcg Intravenous Q T,Th,Sa-HD   heparin  5,000 Units Subcutaneous Q8H   insulin aspart  0-6 Units Subcutaneous TID WC   ipratropium-albuterol  3 mL Nebulization BID   levothyroxine  75 mcg Oral Q0600  methylPREDNISolone (SOLU-MEDROL) injection  40 mg Intravenous Daily   pantoprazole  80 mg Oral Q1200   sodium chloride flush  3 mL Intravenous Q12H     OP HD: [facility]  3h 54min  350/500  32.4kg  2/2 bath  LUA AVF    -Heparin 2000 units IV TIW - last HD 1/25, post hd wt 36.1 kg (3.7kg up) - venofer 100 mg IV tiw IV thru 2/01 - hectorol 4 mcg IV TIW - mircera 30 mcg IV q4 wks, last 12/09, due 1/06 (3 wks ago)   Assessment/ Plan: AHRF - w/ hypoxia and resp distress sent from SNF to ED. CXR w/o sig edema, but pt did come off last OP HD thursday 4 kg over the dry wt. HD yesterday off schedule only removed 2 liters. HD 01/27 Net UF 3.2 liters. No post wt, should be close to OP EDW.  RSV+-per primary. HIV Screening +-confirmatory test pending. Likely false positive. Per primary. ESRD-T,Th,S Next HD 07/22/2022 HTN/ volume - as above. Continue midodrine.  Anemia esrd - Hb 10-11  here, is overdue for esa but will hold unless Hb drops. Will cont IV fe loading w/ each HD thru 2/01.  MBD ckd - CCa in range, cont IV vdra. Renvela binder-PO4 at goal.  H/o CVA  Kariss Longmire H. Craig Wisnewski NP-C 07/20/2022, 9:00 AM  [employer] [phone]

## 2022-07-20 NOTE — Progress Notes (Signed)
PROGRESS NOTE    [name] Michael  UUV:[id number] DOB: Aug 13, 1960 DOA: 07/18/2022 PCP: Seward Carol, MD   Brief Narrative: 62 year old female with past medical history significant for ESRD on hemodialysis TTS, history of CVA, hyperlipidemia, hypertension, diabetes type 2, diastolic heart failure, hypothyroidism, depression, GERD, ACD, chronic thrombocytopenia presents to the ED complaining of weakness and respiratory distress.  Patient was referred from skilled nursing facility due to patient having respiratory distress and inability to get out of the bed.  Patient is not a good historian.  [name] in the ED patient was found to be positive for RSV, chest x-ray cardiomegaly and asymmetric elevation of the right hemidiaphragm.  CTA chest: No PE, small right pleural effusion.  Fatty liver.  Reflux of contrast into the hepatic vein.  Patient admitted with acute hypoxic respiratory failure secondary to pulmonary edema, and RSV.   Assessment & Plan:   Principal Problem:   Acute respiratory failure with hypoxia in setting of RSV Active Problems:   ESRD (end stage renal disease) (HCC)   Elevated troponin   Acute on chronic diastolic CHF (congestive heart failure) (HCC)   Hypothyroidism   Type II diabetes mellitus ([place])   Essential hypertension   Anemia of chronic disease   GERD (gastroesophageal reflux disease)   Hyperlipidemia   Tobacco abuse   RSV (respiratory syncytial virus pneumonia)   1-Acute Hypoxic Respiratory Failure in the setting of RSV: Patient initially presented requiring 15 L nonrebreather mask.  Currently transition to 6 L of oxygen. RSV positive. Also volume overload--- underwent hemodialysis 1/26 Continue with nebulizer, Pulmicort, IV steroids.  Oxygen requirement down to 2 L.  Stable.   2-ESRD on hemodialysis, TTS Patient presented with volume overload.  Underwent hemodialysis on admission 1 8/26 Appreciate Nephrology assistance.    Elevation of troponin: Likely  demand in the setting of acute respiratory failure ECHO; Normal EF  Acute on chronic diastolic heart failure exacerbation: She is 4 kg over dry weight. Underwent hemodialysis 1/26  Hypothyroidism: Continue home Synthroid TSH mildly elevated, free T4 mildly elevated  Type 2 diabetes Continue with a sliding scale insulin  Essential hypertension: Continue with Coreg  Anemia of chronic disease: -Monitor hb.   GERD: Continue with PPI  Hyperlipidemia: Continue with the statins  Tobacco abuse HIV screening positive, confirmatory test pending. Could be false positive, she had screening positive 3 years ago and negative last year   Pressure Injury 02/16/21 Sacrum Circumferential Stage 2 -  Partial thickness loss of dermis presenting as a shallow open injury with a red, pink wound bed without slough. (Active)  02/16/21 1801  Location: Sacrum  Location Orientation: Circumferential  Staging: Stage 2 -  Partial thickness loss of dermis presenting as a shallow open injury with a red, pink wound bed without slough.  Wound Description (Comments):   Present on Admission: Yes      Estimated body mass index is 19.03 kg/m as calculated from the following:   Height as of this encounter: 4\' 6"  (1.372 m).   Weight as of this encounter: 35.8 kg.   DVT prophylaxis: Heparin  Code Status: DNR Family Communication:  Disposition Plan:  Status is: Observation The patient will require care spanning > 2 midnights and should be moved to inpatient because: management of Resp failure    Consultants:  Nephrology   Procedures:  HD  Antimicrobials:    Subjective: She report cough, dyspnea improved.   Objective: Vitals:   07/19/22 2234 07/20/22 0421 07/20/22 0733 07/20/22 1114  BP: Marland Kitchen)  155/71 (!) 157/59    Pulse: 78 70    Resp: 20 16 18 18   Temp: 99.4 F (37.4 C) 98.1 F (36.7 C) 97.7 F (36.5 C) 98.4 F (36.9 C)  TempSrc: Oral Oral Oral Oral  SpO2: 96% 96%  98%  Weight:       Height:        Intake/Output Summary (Last 24 hours) at 07/20/2022 1443 Last data filed at 07/20/2022 0300 Gross per 24 hour  Intake 353 ml  Output 3260 ml  Net -2907 ml    Filed Weights   07/18/22 1254 07/19/22 1633  Weight: 36.3 kg 35.8 kg    Examination:  General exam: Appears calm, follows command Respiratory system: BL [place].  Cardiovascular system: S 1, S 2 RRR Gastrointestinal system: BS present, soft, nt Central nervous system: alert follows command Extremities: no edema   Data Reviewed: I have personally reviewed following labs and imaging studies  CBC: Recent Labs  Lab 07/18/22 1323 07/18/22 1330 07/18/22 1800 07/19/22 0117  WBC 4.9  --   --  3.5*  NEUTROABS 3.8  --   --   --   HGB 9.9* 11.2* 12.9 10.6*  HCT 29.9* 33.0* 38.0 33.9*  MCV 93.1  --   --  95.2  PLT 166  --   --  149*    Basic Metabolic Panel: Recent Labs  Lab 07/18/22 1323 07/18/22 1330 07/18/22 1800 07/19/22 0117  NA 136 135 132* 134*  K 3.8 3.6 5.6* 3.6  CL 93* 92*  --  95*  CO2 30  --   --  25  GLUCOSE 81 81  --  111*  BUN 16 18  --  13  CREATININE 3.56* 3.80*  --  2.64*  CALCIUM 7.9*  --   --  8.1*  MG  --   --   --  1.8  PHOS  --   --   --  4.2    GFR: Estimated Creatinine Clearance: 11.2 mL/min (A) (by C-G formula based on SCr of 2.64 mg/dL (H)). Liver Function Tests: Recent Labs  Lab 07/18/22 1323  AST 31  ALT 13  ALKPHOS 87  BILITOT 0.7  PROT 5.9*  ALBUMIN 2.9*    No results for input(s): "LIPASE", "AMYLASE" in the last 168 hours. No results for input(s): "AMMONIA" in the last 168 hours. Coagulation Profile: No results for input(s): "INR", "PROTIME" in the last 168 hours. Cardiac Enzymes: No results for input(s): "CKTOTAL", "CKMB", "CKMBINDEX", "TROPONINI" in the last 168 hours. BNP (last 3 results) No results for input(s): "PROBNP" in the last 8760 hours. HbA1C: Recent Labs    07/19/22 0117  HGBA1C 5.7*    CBG: Recent Labs  Lab 07/19/22 0822  07/19/22 1218 07/19/22 2232 07/20/22 0731 07/20/22 1239  GLUCAP 71 161* 133* 127* 244*    Lipid Profile: No results for input(s): "CHOL", "HDL", "LDLCALC", "TRIG", "CHOLHDL", "LDLDIRECT" in the last 72 hours. Thyroid Function Tests: Recent Labs    07/19/22 0117  TSH 4.970*  FREET4 1.43*    Anemia Panel: No results for input(s): "VITAMINB12", "FOLATE", "FERRITIN", "TIBC", "IRON", "RETICCTPCT" in the last 72 hours. Sepsis Labs: Recent Labs  Lab 07/18/22 1323 07/18/22 1721  LATICACIDVEN 1.4 1.3     Recent Results (from the past 240 hour(s))  Resp panel by RT-PCR (RSV, Flu A&B, Covid) Anterior Nasal Swab     Status: Abnormal   Collection Time: 07/18/22  1:23 PM   Specimen: Anterior Nasal Swab  Result  Value Ref Range Status   SARS Coronavirus 2 by RT PCR NEGATIVE NEGATIVE Final    Comment: (NOTE) SARS-CoV-2 target nucleic acids are NOT DETECTED.  The SARS-CoV-2 RNA is generally detectable in upper respiratory specimens during the acute phase of infection. The lowest concentration of SARS-CoV-2 viral copies this assay can detect is 138 copies/mL. A negative result does not preclude SARS-Cov-2 infection and should not be used as the sole basis for treatment or other patient management decisions. A negative result may occur with  improper specimen collection/handling, submission of specimen other than nasopharyngeal swab, presence of viral mutation(s) within the areas targeted by this assay, and inadequate number of viral copies(<138 copies/mL). A negative result must be combined with clinical observations, patient history, and epidemiological information. The expected result is Negative.  Fact Sheet for Patients:  EntrepreneurPulse.com.au  Fact Sheet for Healthcare Providers:  IncredibleEmployment.be  This test is no t yet approved or cleared by the [place] FDA and  has been authorized for detection and/or diagnosis of  SARS-CoV-2 by FDA under an Emergency Use Authorization (EUA). This EUA will remain  in effect (meaning this test can be used) for the duration of the COVID-19 declaration under Section 564(b)(1) of the Act, 21 U.S.C.section 360bbb-3(b)(1), unless the authorization is terminated  or revoked sooner.       Influenza A by PCR NEGATIVE NEGATIVE Final   Influenza B by PCR NEGATIVE NEGATIVE Final    Comment: (NOTE) The Xpert Xpress SARS-CoV-2/FLU/RSV plus assay is intended as an aid in the diagnosis of influenza from Nasopharyngeal swab specimens and should not be used as a sole basis for treatment. Nasal washings and aspirates are unacceptable for Xpert Xpress SARS-CoV-2/FLU/RSV testing.  Fact Sheet for Patients: EntrepreneurPulse.com.au  Fact Sheet for Healthcare Providers: IncredibleEmployment.be  This test is not yet approved or cleared by the [place] FDA and has been authorized for detection and/or diagnosis of SARS-CoV-2 by FDA under an Emergency Use Authorization (EUA). This EUA will remain in effect (meaning this test can be used) for the duration of the COVID-19 declaration under Section 564(b)(1) of the Act, 21 U.S.C. section 360bbb-3(b)(1), unless the authorization is terminated or revoked.     Resp Syncytial Virus by PCR POSITIVE (A) NEGATIVE Final    Comment: (NOTE) Fact Sheet for Patients: EntrepreneurPulse.com.au  Fact Sheet for Healthcare Providers: IncredibleEmployment.be  This test is not yet approved or cleared by the [place] FDA and has been authorized for detection and/or diagnosis of SARS-CoV-2 by FDA under an Emergency Use Authorization (EUA). This EUA will remain in effect (meaning this test can be used) for the duration of the COVID-19 declaration under Section 564(b)(1) of the Act, 21 U.S.C. section 360bbb-3(b)(1), unless the authorization is terminated  or revoked.  Performed at [facility], [place] [address], [place], [place] [address]          Radiology Studies: ECHOCARDIOGRAM COMPLETE  Result Date: 07/19/2022    ECHOCARDIOGRAM REPORT   Patient Name:   DEANA KROCK Date of Exam: 07/19/2022 Medical Rec #:  [id number]      Height:       54.0 in Accession #:    [id number]     Weight:       80.0 lb Date of Birth:  02-15-1961      BSA:          1.172 m Patient Age:    70 years       BP:  157/64 mmHg Patient Gender: F              HR:           74 bpm. Exam Location:  Inpatient Procedure: 2D Echo, Cardiac Doppler and Color Doppler Indications:    S50.53 Acute diastolic (congestive) heart failure  History:        Patient has prior history of Echocardiogram examinations. Risk                 Factors:Diabetes, Dyslipidemia and Hypertension.  Sonographer:    Phineas Douglas Referring Phys: [id number] ALLISON WOLFE IMPRESSIONS  1. TV leaflets do not coapt; wide open TR; therefore cannot estimate PA pressure.  2. Left ventricular ejection fraction, by estimation, is 60 to 65%. The left ventricle has normal function. The left ventricle has no regional wall motion abnormalities. Left ventricular diastolic parameters were normal. There is the interventricular septum is flattened in systole and diastole, consistent with right ventricular pressure and volume overload.  3. Right ventricular systolic function is normal. The right ventricular size is severely enlarged.  4. Left atrial size was mildly dilated.  5. Right atrial size was severely dilated.  6. The mitral valve is normal in structure. Mild mitral valve regurgitation. No evidence of mitral stenosis.  7. 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  8. The aortic valve is tricuspid. Aortic valve regurgitation is not visualized. No aortic stenosis is present.  9. The inferior vena cava is dilated in size with <50% respiratory variability, suggesting right atrial pressure of 15 mmHg. FINDINGS  Left  Ventricle: Left ventricular ejection fraction, by estimation, is 60 to 65%. The left ventricle has normal function. The left ventricle has no regional wall motion abnormalities. The left ventricular internal cavity size was normal in size. There is  no left ventricular hypertrophy. The interventricular septum is flattened in systole and diastole, consistent with right ventricular pressure and volume overload. Left ventricular diastolic parameters were normal. Right Ventricle: The right ventricular size is severely enlarged. Right ventricular systolic function is normal. Left Atrium: Left atrial size was mildly dilated. Right Atrium: Right atrial size was severely dilated. Pericardium: There is no evidence of pericardial effusion. Mitral Valve: The mitral valve is normal in structure. Mild mitral valve regurgitation. No evidence of mitral valve stenosis. Tricuspid Valve: The tricuspid valve is normal in structure. 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 No evidence of tricuspid stenosis. Aortic Valve: The aortic valve is tricuspid. Aortic valve regurgitation is not visualized. No aortic stenosis is present. Pulmonic Valve: The pulmonic valve was normal in structure. Pulmonic valve regurgitation is trivial. No evidence of pulmonic stenosis. Aorta: The aortic root is normal in size and structure. Venous: The inferior vena cava is dilated in size with less than 50% respiratory variability, suggesting right atrial pressure of 15 mmHg. IAS/Shunts: No atrial level shunt detected by color flow Doppler. Additional Comments: TV leaflets do not coapt; wide open TR; therefore cannot estimate PA pressure.  LEFT VENTRICLE PLAX 2D LVIDd:         4.40 cm      Diastology LVIDs:         2.60 cm      LV e' medial:    8.81 cm/s LV PW:         0.90 cm      LV E/e' medial:  9.6 LV IVS:        0.90 cm      LV e' lateral:   12.60 cm/s LVOT diam:  1.60 cm      LV E/e' lateral: 6.7 LV SV:         45 LV SV Index:   38 LVOT Area:     2.01  cm  LV Volumes (MOD) LV vol d, MOD A2C: 106.0 ml LV vol d, MOD A4C: 100.0 ml LV vol s, MOD A2C: 45.4 ml LV vol s, MOD A4C: 42.4 ml LV SV MOD A2C:     60.6 ml LV SV MOD A4C:     100.0 ml LV SV MOD BP:      60.4 ml RIGHT VENTRICLE             IVC RV Basal diam:  5.60 cm     IVC diam: 2.30 cm RV S prime:     11.50 cm/s TAPSE (M-mode): 2.0 cm LEFT ATRIUM             Index        RIGHT ATRIUM           Index LA diam:        3.40 cm 2.90 cm/m   RA Area:     21.60 cm LA Vol (A2C):   45.9 ml 39.17 ml/m  RA Volume:   65.60 ml  55.98 ml/m LA Vol (A4C):   38.2 ml 32.60 ml/m LA Biplane Vol: 42.6 ml 36.35 ml/m  AORTIC VALVE LVOT Vmax:   104.00 cm/s LVOT Vmean:  70.900 cm/s LVOT VTI:    0.222 m  AORTA Ao Root diam: 2.50 cm Ao Asc diam:  2.70 cm MITRAL VALVE               TRICUSPID VALVE MV Area (PHT): 3.68 cm    TR Peak grad:   16.2 mmHg MV Decel Time: 206 msec    TR Vmax:        201.00 cm/s MV E velocity: 84.40 cm/s MV A velocity: 80.70 cm/s  SHUNTS MV E/A ratio:  1.05        Systemic VTI:  0.22 m                            Systemic Diam: 1.60 cm Kirk Ruths MD Electronically signed by Kirk Ruths MD Signature Date/Time: 07/19/2022/2:30:03 PM    Final    [name] ASCITES (ABDOMEN LIMITED)  Result Date: 07/19/2022 CLINICAL DATA:  Evaluate for ascites EXAM: LIMITED ABDOMEN ULTRASOUND FOR ASCITES TECHNIQUE: Limited ultrasound survey for ascites was performed in all four abdominal quadrants. COMPARISON:  None Available. FINDINGS: A small amount of ascites is identified in all 4 quadrants but is most prominent adjacent to the liver. IMPRESSION: Small amount of ascites in all 4 quadrants. Electronically Signed   By: Dorise Bullion III M.D.   On: 07/19/2022 08:58   CT Angio Chest PE W and/or Wo Contrast  Result Date: 07/18/2022 CLINICAL DATA:  Shortness of breath.  Low O2 sats EXAM: CT ANGIOGRAPHY CHEST WITH CONTRAST TECHNIQUE: Multidetector CT imaging of the chest was performed using the standard protocol during bolus  administration of intravenous contrast. Multiplanar CT image reconstructions and MIPs were obtained to evaluate the vascular anatomy. RADIATION DOSE REDUCTION: This exam was performed according to the departmental dose-optimization program which includes automated exposure control, adjustment of the mA and/or kV according to patient size and/or use of iterative reconstruction technique. CONTRAST:  25mL OMNIPAQUE IOHEXOL 350 MG/ML SOLN COMPARISON:  X-ray earlier 07/18/2022.  CT angiogram 02/18/2021 FINDINGS: Cardiovascular: Heart is  enlarged. Trace pericardial fluid. Bovine type aortic arch. Grossly the thoracic aorta has a normal course and caliber with scattered partially calcified atherosclerotic plaque plaque also extends along the origin of the great vessels including the left subclavian artery. There is significant contrast refluxing into the IVC and hepatic veins. Please correlate with patient's clinical history. Significant breathing motion identified. This can limit evaluation of small peripheral emboli. No segmental or larger embolus identified. Mediastinum/Nodes: No specific abnormal lymph node enlargement seen in the axillary regions, hilum or mediastinum. Small thyroid gland. Mildly patulous thoracic esophagus. Lungs/Pleura: Small right pleural effusion. No pneumothorax. Scattered areas of scar or atelectatic changes once again identified. The effusion was seen previously as well. There are areas of bronchial wall thickening identified along both lower lobes. Upper Abdomen: Fatty liver infiltration. Scattered ascites. Gallstone. Musculoskeletal: Degenerative changes seen along the spine. There are healed bilateral rib fractures identified. Please correlate with history. There are also healed upper sternal fracture. Presumed sebaceous cyst along the upper right back subcutaneous nodule on series 6, image 31. Review of the MIP images confirms the above findings. IMPRESSION: Breathing motion.  No segmental  or larger pulmonary embolism. Enlarged heart with reflux of contrast into the hepatic veins. Please correlate with liver failure. Anasarca.  Right small pleural effusion. Gallstone.  Fatty liver infiltration. Aortic Atherosclerosis (ICD10-I70.0). Electronically Signed   By: Jill Side M.D.   On: 07/18/2022 16:54   CT Head Wo Contrast  Result Date: 07/18/2022 CLINICAL DATA:  Mental status change, unknown cause EXAM: CT HEAD WITHOUT CONTRAST TECHNIQUE: Contiguous axial images were obtained from the base of the skull through the vertex without intravenous contrast. RADIATION DOSE REDUCTION: This exam was performed according to the departmental dose-optimization program which includes automated exposure control, adjustment of the mA and/or kV according to patient size and/or use of iterative reconstruction technique. COMPARISON:  04/17/2021 FINDINGS: Brain: No evidence of acute infarction, hemorrhage, hydrocephalus, extra-axial collection or mass lesion/mass effect. Chronic lacunar infarcts in the bilateral basal ganglia. Patchy low-density changes within the periventricular and subcortical white matter compatible with chronic microvascular ischemic change. Mild diffuse cerebral volume loss. Vascular: Atherosclerotic calcifications involving the large vessels of the skull base. No unexpected hyperdense vessel. Skull: Normal. Negative for fracture or focal lesion. Sinuses/Orbits: Mucosal thickening of the ethmoid air cells. Otherwise clear. Other: None. IMPRESSION: 1. No acute intracranial findings. 2. Chronic microvascular ischemic change and cerebral volume loss. Electronically Signed   By: Davina Poke D.O.   On: 07/18/2022 16:54        Scheduled Meds:  aspirin  81 mg Oral Daily   budesonide (PULMICORT) nebulizer solution  0.25 mg Nebulization BID   carvedilol  6.25 mg Oral BID WC   doxercalciferol  4 mcg Intravenous Q T,Th,Sa-HD   heparin  5,000 Units Subcutaneous Q8H   insulin aspart  0-6 Units  Subcutaneous TID WC   ipratropium-albuterol  3 mL Nebulization BID   levothyroxine  75 mcg Oral Q0600   methylPREDNISolone (SOLU-MEDROL) injection  40 mg Intravenous Daily   pantoprazole  80 mg Oral Q1200   sodium chloride flush  3 mL Intravenous Q12H   Continuous Infusions:  sodium chloride     ferric gluconate (FERRLECIT) IVPB Stopped (07/19/22 2228)     LOS: 1 day    Time spent: 35 Minutes.     Elmarie Shiley, MD [facility]   If 7PM-7AM, please contact night-coverage [email or web address]  07/20/2022, 2:43 PM

## 2022-07-21 DIAGNOSIS — J9601 Acute respiratory failure with hypoxia: Secondary | ICD-10-CM | POA: Diagnosis not present

## 2022-07-21 LAB — GLUCOSE, CAPILLARY
Glucose-Capillary: 141 mg/dL — ABNORMAL HIGH (ref 70–99)
Glucose-Capillary: 225 mg/dL — ABNORMAL HIGH (ref 70–99)

## 2022-07-21 LAB — HEPATITIS B SURFACE ANTIBODY, QUANTITATIVE: Hep B S AB Quant (Post): 29.4 m[IU]/mL (ref >9.9)

## 2022-07-21 MED ORDER — IPRATROPIUM-ALBUTEROL 0.5-2.5 (3) MG/3ML IN SOLN: 3.0000 mL | Freq: Four times a day (QID) | RESPIRATORY_TRACT | Status: DC

## 2022-07-21 MED ORDER — PREDNISONE 20 MG PO TABS: 40.0000 mg | ORAL_TABLET | Freq: Every day | ORAL | 0 refills | Status: AC

## 2022-07-21 NOTE — Evaluation (Signed)
Physical Therapy Evaluation Patient Details Name: [name] Michael MRN: [id number] DOB: April 23, 1961 Today's Date: 07/21/2022  History of Present Illness  62 yo female presents to [facility] ED on 1/26 for respiratory distress, +RSV and possible fluid overload. PMH includes ESRD TTS, HTN, HLD, CVA, DMII, tricuspid regurgitation, depression.  Clinical Impression   Pt presents with generalized weakness, impaired balance, min dyspnea on exertion but spo2 91% and greater on RA throughout, and poor activity tolerance. Pt to benefit from acute PT to address deficits. Pt ambulated to/from bathroom this session with very increased time and use of Rw, pt requiring physical assist for bed mobility only and is otherwise close guard for safety. Pt reports feeling weaker than baseline, recommend rehab once back to SNF. PT to progress mobility as tolerated, and will continue to follow acutely.         Recommendations for follow up therapy are one component of a multi-disciplinary discharge planning process, led by the attending physician.  Recommendations may be updated based on patient status, additional functional criteria and insurance authorization.  Follow Up Recommendations Skilled nursing-short term rehab (<3 hours/day)      Assistance Recommended at Discharge Intermittent Supervision/Assistance  Patient can return home with the following  A little help with walking and/or transfers;A little help with bathing/dressing/bathroom    Equipment Recommendations None recommended by PT  Recommendations for Other Services       Functional Status Assessment Patient has had a recent decline in their functional status and demonstrates the ability to make significant improvements in function in a reasonable and predictable amount of time.     Precautions / Restrictions Precautions Precautions: Fall Restrictions Weight Bearing Restrictions: No      Mobility  Bed Mobility Overal bed mobility: Needs  Assistance Bed Mobility: Supine to Sit, Sit to Supine     Supine to sit: Min assist, HOB elevated Sit to supine: Min assist, HOB elevated   General bed mobility comments: assist for trunk elevation/lowering, scootign to/from EOB.    Transfers Overall transfer level: Needs assistance Equipment used: Rolling walker (2 wheels) Transfers: Sit to/from Stand Sit to Stand: Min guard           General transfer comment: increased time to rise and steady    Ambulation/Gait Ambulation/Gait assistance: Min guard Gait Distance (Feet): 12 Feet (x2 - to and from the bathroom) Assistive device: Rolling walker (2 wheels) Gait Pattern/deviations: Step-through pattern, Decreased stride length, Trunk flexed Gait velocity: decr     General Gait Details: cues for placement in RW, occasional RW navigation assist. SpO2 91% and greater on RA  Stairs            Wheelchair Mobility    Modified Rankin (Stroke Patients Only)       Balance Overall balance assessment: Needs assistance Sitting-balance support: No upper extremity supported, Feet supported Sitting balance-Leahy Scale: Fair     Standing balance support: Bilateral upper extremity supported, During functional activity Standing balance-Leahy Scale: Poor                               Pertinent Vitals/Pain Pain Assessment Pain Assessment: No/denies pain    Home Living Family/patient expects to be discharged to:: Skilled nursing facility                        Prior Function Prior Level of Function : Needs assist;Patient poor historian/Family not available  Mobility Comments: pt stating she uses rollator or w/c, reports she does not need staff help to mobilize ADLs Comments: pt initially states she is independent, then states she gets help dressing and bathing. Unsure of true pt baseline     Hand Dominance   Dominant Hand: Right    Extremity/Trunk Assessment   Upper Extremity  Assessment Upper Extremity Assessment: Defer to OT evaluation    Lower Extremity Assessment Lower Extremity Assessment: Generalized weakness    Cervical / Trunk Assessment Cervical / Trunk Assessment: Kyphotic  Communication   Communication: No difficulties  Cognition Arousal/Alertness: Awake/alert Behavior During Therapy: Flat affect Overall Cognitive Status: No family/caregiver present to determine baseline cognitive functioning                                          General Comments      Exercises     Assessment/Plan    PT Assessment Patient needs continued PT services  PT Problem List Decreased strength;Decreased mobility;Decreased activity tolerance;Decreased balance;Decreased knowledge of use of DME;Decreased safety awareness       PT Treatment Interventions Therapeutic activities;DME instruction;Gait training;Therapeutic exercise;Patient/family education;Balance training;Stair training;Neuromuscular re-education;Functional mobility training    PT Goals (Current goals can be found in the Care Plan section)  Acute Rehab PT Goals PT Goal Formulation: With patient Time For Goal Achievement: 07/28/22 Potential to Achieve Goals: Good    Frequency Min 3X/week     Co-evaluation               AM-PAC PT "6 Clicks" Mobility  Outcome Measure Help needed turning from your back to your side while in a flat bed without using bedrails?: A Little Help needed moving from lying on your back to sitting on the side of a flat bed without using bedrails?: A Little Help needed moving to and from a bed to a chair (including a wheelchair)?: A Little Help needed standing up from a chair using your arms (e.g., wheelchair or bedside chair)?: A Little Help needed to walk in hospital room?: A Little Help needed climbing 3-5 steps with a railing? : A Little 6 Click Score: 18    End of Session Equipment Utilized During Treatment: Other (comment) (O2 removed  during session with SpO2 maintained 91% and greater) Activity Tolerance: Patient tolerated treatment well;Patient limited by fatigue Patient left: in bed;with call bell/phone within reach;with nursing/sitter in room Nurse Communication: Mobility status PT Visit Diagnosis: Other abnormalities of gait and mobility (R26.89);Muscle weakness (generalized) (M62.81)    Time: 1337-1400 PT Time Calculation (min) (ACUTE ONLY): 23 min   Charges:   PT Evaluation $PT Eval Low Complexity: 1 Low         Kataryna Mcquilkin S, PT DPT Acute Rehabilitation Services Pager [phone]  Office [phone]   Roxine Caddy E Ruffin Pyo 07/21/2022, 3:25 PM

## 2022-07-21 NOTE — Discharge Summary (Signed)
Physician Discharge Summary   Patient: [name] Michael MRN: [id number] DOB: September 05, 1960  Admit date:     07/18/2022  Discharge date: 07/21/22  Discharge Physician: Elmarie Shiley   PCP: Seward Carol, MD   Recommendations at discharge:   Needs HD 1/30 Needs follow up HIV test results.   Discharge Diagnoses: Principal Problem:   Acute respiratory failure with hypoxia in setting of RSV Active Problems:   ESRD (end stage renal disease) (HCC)   Elevated troponin   Acute on chronic diastolic CHF (congestive heart failure) (HCC)   Hypothyroidism   Type II diabetes mellitus ([place])   Essential hypertension   Anemia of chronic disease   GERD (gastroesophageal reflux disease)   Hyperlipidemia   Tobacco abuse   RSV (respiratory syncytial virus pneumonia)  Resolved Problems:   * No resolved hospital problems. *  Hospital Course: 62 year old female with past medical history significant for ESRD on hemodialysis TTS, history of CVA, hyperlipidemia, hypertension, diabetes type 2, diastolic heart failure, hypothyroidism, depression, GERD, ACD, chronic thrombocytopenia presents to the ED complaining of weakness and respiratory distress.  Patient was referred from skilled nursing facility due to patient having respiratory distress and inability to get out of the bed.  Patient is not a good historian.  [name] in the ED patient was found to be positive for RSV, chest x-ray cardiomegaly and asymmetric elevation of the right hemidiaphragm.  CTA chest: No PE, small right pleural effusion.  Fatty liver.  Reflux of contrast into the hepatic vein.   Patient admitted with acute hypoxic respiratory failure secondary to pulmonary edema, and RSV.     Assessment and Plan: 1-Acute Hypoxic Respiratory Failure in the setting of RSV: Patient initially presented requiring 15 L nonrebreather mask.  Currently transition to 6 L of oxygen. RSV positive. Also volume overload--- underwent hemodialysis  1/26 Continue with nebulizer, Pulmicort, IV steroids.  Oxygen requirement down to 2 L.  Stable.  Discharge on prednisone 40 mg for 5 days.  Duoneb.  Stable for discharge.    2-ESRD on hemodialysis, TTS Patient presented with volume overload.  Underwent hemodialysis on admission 1 8/26 Appreciate Nephrology assistance.      Elevation of troponin: Likely demand in the setting of acute respiratory failure ECHO; Normal EF   Acute on chronic diastolic heart failure exacerbation: She is 4 kg over dry weight. Underwent hemodialysis 1/26  stable on 2 L oxygen.   Hypothyroidism: Continue home Synthroid TSH mildly elevated, free T4 mildly elevated   Type 2 diabetes Continue with a sliding scale insulin   Essential hypertension: Continue with Coreg   Anemia of chronic disease: -Monitor hb.    GERD: Continue with PPI   Hyperlipidemia: Continue with the statins   Tobacco abuse HIV screening positive, confirmatory test pending. Could be false positive, she had screening positive 3 years ago and negative last year  ID will follow up results and discussed with patient.          Consultants: Nephrology  Procedures performed: HD Disposition: Skilled nursing facility Diet recommendation:  Discharge Diet Orders (From admission, onward)     Start     Ordered   07/21/22 0000  Diet - low sodium heart healthy        07/21/22 1318           Cardiac diet DISCHARGE MEDICATION: Allergies as of 07/21/2022       Reactions   Hydrocodone Nausea And Vomiting, Other (See Comments)   "Allergic," per [facility]  Medication List     STOP taking these medications    feeding supplement (PRO-STAT SUGAR FREE 64) Liqd   Lokelma 10 g Pack packet Generic drug: sodium zirconium cyclosilicate   sodium bicarbonate 650 MG tablet       TAKE these medications    acetaminophen 500 MG tablet Commonly known as: TYLENOL Take 500 mg by mouth every 6 (six) hours as needed for mild  pain.   aspirin 81 MG chewable tablet Chew 81 mg by mouth daily.   atorvastatin 40 MG tablet Commonly known as: LIPITOR Take 40 mg by mouth at bedtime.   b complex-vitamin c-folic acid 0.8 MG Tabs tablet Take 1 tablet by mouth at bedtime.   carvedilol 6.25 MG tablet Commonly known as: COREG Take 1 tablet (6.25 mg total) by mouth 2 (two) times daily with a meal.   cetirizine 10 MG tablet Commonly known as: ZYRTEC Take 10 mg by mouth daily as needed for allergies.   cloNIDine 0.1 MG tablet Commonly known as: CATAPRES Take 0.1 mg by mouth every 8 (eight) hours as needed (BP>160/90).   Combivent Respimat 20-100 MCG/ACT Aers respimat Generic drug: Ipratropium-Albuterol Inhale 1 puff into the lungs every 6 (six) hours as needed for wheezing or shortness of breath.   esomeprazole 40 MG capsule Commonly known as: NEXIUM Take 40 mg by mouth daily.   eucerin cream Apply 1 Application topically 2 (two) times daily.   FLUoxetine 10 MG capsule Commonly known as: PROZAC Take 10 mg by mouth daily.   loperamide 2 MG capsule Commonly known as: IMODIUM Take 2 mg by mouth daily as needed for diarrhea or loose stools.   midodrine 10 MG tablet Commonly known as: PROAMATINE Take 10 mg by mouth See admin instructions. Take 1 tablet (10 mg) by mouth prior to dialysis on Tuesday, Thursday, Saturday   mirtazapine 7.5 MG tablet Commonly known as: REMERON Take 7.5 mg by mouth at bedtime.   NovaSource Renal Liqd Take 237 mLs by mouth with breakfast, with lunch, and with evening meal.   omega-3 acid ethyl esters 1 g capsule Commonly known as: LOVAZA Take 2 capsules by mouth 2 (two) times daily.   ondansetron 4 MG tablet Commonly known as: ZOFRAN Take 4 mg by mouth every 8 (eight) hours as needed for nausea or vomiting.   predniSONE 20 MG tablet Commonly known as: DELTASONE Take 2 tablets (40 mg total) by mouth daily for 5 days.   PREPARATION H RAPID RELIEF EX Apply topically  every 6 (six) hours as needed (hemorrhoid).   SACCHAROMYCES BOULARDII PO Take 1 capsule by mouth 2 (two) times daily.   senna-docusate 8.6-50 MG tablet Commonly known as: Senokot-S Take 1 tablet by mouth at bedtime as needed for mild constipation.   sevelamer carbonate 800 MG tablet Commonly known as: RENVELA Take 800 mg by mouth 3 (three) times daily with meals.   Synthroid 88 MCG tablet Generic drug: levothyroxine Take 88 mcg by mouth daily before breakfast.        Discharge Exam: Filed Weights   07/19/22 1633 07/21/22 0340 07/21/22 0500  Weight: 35.8 kg 33 kg 33 kg   General; NAD  Condition at discharge: stable  The results of significant diagnostics from this hospitalization (including imaging, microbiology, ancillary and laboratory) are listed below for reference.   Imaging Studies: ECHOCARDIOGRAM COMPLETE  Result Date: 07/19/2022    ECHOCARDIOGRAM REPORT   Patient Name:   [name] Michael Date of Exam: 07/19/2022 Medical Rec #:  [id number]      Height:       54.0 in Accession #:    [id number]     Weight:       80.0 lb Date of Birth:  [birth year]      BSA:          1.172 m Patient Age:    26 years       BP:           157/64 mmHg Patient Gender: F              HR:           74 bpm. Exam Location:  Inpatient Procedure: 2D Echo, Cardiac Doppler and Color Doppler Indications:    F02.77 Acute diastolic (congestive) heart failure  History:        Patient has prior history of Echocardiogram examinations. Risk                 Factors:Diabetes, Dyslipidemia and Hypertension.  Sonographer:    Phineas Douglas Referring Phys: [id number] ALLISON WOLFE IMPRESSIONS  1. TV leaflets do not coapt; wide open TR; therefore cannot estimate PA pressure.  2. Left ventricular ejection fraction, by estimation, is 60 to 65%. The left ventricle has normal function. The left ventricle has no regional wall motion abnormalities. Left ventricular diastolic parameters were normal. There is the interventricular septum  is flattened in systole and diastole, consistent with right ventricular pressure and volume overload.  3. Right ventricular systolic function is normal. The right ventricular size is severely enlarged.  4. Left atrial size was mildly dilated.  5. Right atrial size was severely dilated.  6. The mitral valve is normal in structure. Mild mitral valve regurgitation. No evidence of mitral stenosis.  7. 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  8. The aortic valve is tricuspid. Aortic valve regurgitation is not visualized. No aortic stenosis is present.  9. The inferior vena cava is dilated in size with <50% respiratory variability, suggesting right atrial pressure of 15 mmHg. FINDINGS  Left Ventricle: Left ventricular ejection fraction, by estimation, is 60 to 65%. The left ventricle has normal function. The left ventricle has no regional wall motion abnormalities. The left ventricular internal cavity size was normal in size. There is  no left ventricular hypertrophy. The interventricular septum is flattened in systole and diastole, consistent with right ventricular pressure and volume overload. Left ventricular diastolic parameters were normal. Right Ventricle: The right ventricular size is severely enlarged. Right ventricular systolic function is normal. Left Atrium: Left atrial size was mildly dilated. Right Atrium: Right atrial size was severely dilated. Pericardium: There is no evidence of pericardial effusion. Mitral Valve: The mitral valve is normal in structure. Mild mitral valve regurgitation. No evidence of mitral valve stenosis. Tricuspid Valve: The tricuspid valve is normal in structure. 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 No evidence of tricuspid stenosis. Aortic Valve: The aortic valve is tricuspid. Aortic valve regurgitation is not visualized. No aortic stenosis is present. Pulmonic Valve: The pulmonic valve was normal in structure. Pulmonic valve regurgitation is trivial. No evidence of pulmonic  stenosis. Aorta: The aortic root is normal in size and structure. Venous: The inferior vena cava is dilated in size with less than 50% respiratory variability, suggesting right atrial pressure of 15 mmHg. IAS/Shunts: No atrial level shunt detected by color flow Doppler. Additional Comments: TV leaflets do not coapt; wide open TR; therefore cannot estimate PA pressure.  LEFT VENTRICLE PLAX 2D LVIDd:  4.40 cm      Diastology LVIDs:         2.60 cm      LV e' medial:    8.81 cm/s LV PW:         0.90 cm      LV E/e' medial:  9.6 LV IVS:        0.90 cm      LV e' lateral:   12.60 cm/s LVOT diam:     1.60 cm      LV E/e' lateral: 6.7 LV SV:         45 LV SV Index:   38 LVOT Area:     2.01 cm  LV Volumes (MOD) LV vol d, MOD A2C: 106.0 ml LV vol d, MOD A4C: 100.0 ml LV vol s, MOD A2C: 45.4 ml LV vol s, MOD A4C: 42.4 ml LV SV MOD A2C:     60.6 ml LV SV MOD A4C:     100.0 ml LV SV MOD BP:      60.4 ml RIGHT VENTRICLE             IVC RV Basal diam:  5.60 cm     IVC diam: 2.30 cm RV S prime:     11.50 cm/s TAPSE (M-mode): 2.0 cm LEFT ATRIUM             Index        RIGHT ATRIUM           Index LA diam:        3.40 cm 2.90 cm/m   RA Area:     21.60 cm LA Vol (A2C):   45.9 ml 39.17 ml/m  RA Volume:   65.60 ml  55.98 ml/m LA Vol (A4C):   38.2 ml 32.60 ml/m LA Biplane Vol: 42.6 ml 36.35 ml/m  AORTIC VALVE LVOT Vmax:   104.00 cm/s LVOT Vmean:  70.900 cm/s LVOT VTI:    0.222 m  AORTA Ao Root diam: 2.50 cm Ao Asc diam:  2.70 cm MITRAL VALVE               TRICUSPID VALVE MV Area (PHT): 3.68 cm    TR Peak grad:   16.2 mmHg MV Decel Time: 206 msec    TR Vmax:        201.00 cm/s MV E velocity: 84.40 cm/s MV A velocity: 80.70 cm/s  SHUNTS MV E/A ratio:  1.05        Systemic VTI:  0.22 m                            Systemic Diam: 1.60 cm Kirk Ruths MD Electronically signed by Kirk Ruths MD Signature Date/Time: 07/19/2022/2:30:03 PM    Final    [name] ASCITES (ABDOMEN LIMITED)  Result Date: 07/19/2022 CLINICAL DATA:   Evaluate for ascites EXAM: LIMITED ABDOMEN ULTRASOUND FOR ASCITES TECHNIQUE: Limited ultrasound survey for ascites was performed in all four abdominal quadrants. COMPARISON:  None Available. FINDINGS: A small amount of ascites is identified in all 4 quadrants but is most prominent adjacent to the liver. IMPRESSION: Small amount of ascites in all 4 quadrants. Electronically Signed   By: Dorise Bullion III M.D.   On: 07/19/2022 08:58   CT Angio Chest PE W and/or Wo Contrast  Result Date: 07/18/2022 CLINICAL DATA:  Shortness of breath.  Low O2 sats EXAM: CT ANGIOGRAPHY CHEST WITH CONTRAST TECHNIQUE: Multidetector CT imaging of  the chest was performed using the standard protocol during bolus administration of intravenous contrast. Multiplanar CT image reconstructions and MIPs were obtained to evaluate the vascular anatomy. RADIATION DOSE REDUCTION: This exam was performed according to the departmental dose-optimization program which includes automated exposure control, adjustment of the mA and/or kV according to patient size and/or use of iterative reconstruction technique. CONTRAST:  44mL OMNIPAQUE IOHEXOL 350 MG/ML SOLN COMPARISON:  X-ray earlier 07/18/2022.  CT angiogram 02/18/2021 FINDINGS: Cardiovascular: Heart is enlarged. Trace pericardial fluid. Bovine type aortic arch. Grossly the thoracic aorta has a normal course and caliber with scattered partially calcified atherosclerotic plaque plaque also extends along the origin of the great vessels including the left subclavian artery. There is significant contrast refluxing into the IVC and hepatic veins. Please correlate with patient's clinical history. Significant breathing motion identified. This can limit evaluation of small peripheral emboli. No segmental or larger embolus identified. Mediastinum/Nodes: No specific abnormal lymph node enlargement seen in the axillary regions, hilum or mediastinum. Small thyroid gland. Mildly patulous thoracic esophagus.  Lungs/Pleura: Small right pleural effusion. No pneumothorax. Scattered areas of scar or atelectatic changes once again identified. The effusion was seen previously as well. There are areas of bronchial wall thickening identified along both lower lobes. Upper Abdomen: Fatty liver infiltration. Scattered ascites. Gallstone. Musculoskeletal: Degenerative changes seen along the spine. There are healed bilateral rib fractures identified. Please correlate with history. There are also healed upper sternal fracture. Presumed sebaceous cyst along the upper right back subcutaneous nodule on series 6, image 31. Review of the MIP images confirms the above findings. IMPRESSION: Breathing motion.  No segmental or larger pulmonary embolism. Enlarged heart with reflux of contrast into the hepatic veins. Please correlate with liver failure. Anasarca.  Right small pleural effusion. Gallstone.  Fatty liver infiltration. Aortic Atherosclerosis (ICD10-I70.0). Electronically Signed   By: Jill Side M.D.   On: 07/18/2022 16:54   CT Head Wo Contrast  Result Date: 07/18/2022 CLINICAL DATA:  Mental status change, unknown cause EXAM: CT HEAD WITHOUT CONTRAST TECHNIQUE: Contiguous axial images were obtained from the base of the skull through the vertex without intravenous contrast. RADIATION DOSE REDUCTION: This exam was performed according to the departmental dose-optimization program which includes automated exposure control, adjustment of the mA and/or kV according to patient size and/or use of iterative reconstruction technique. COMPARISON:  04/17/2021 FINDINGS: Brain: No evidence of acute infarction, hemorrhage, hydrocephalus, extra-axial collection or mass lesion/mass effect. Chronic lacunar infarcts in the bilateral basal ganglia. Patchy low-density changes within the periventricular and subcortical white matter compatible with chronic microvascular ischemic change. Mild diffuse cerebral volume loss. Vascular: Atherosclerotic  calcifications involving the large vessels of the skull base. No unexpected hyperdense vessel. Skull: Normal. Negative for fracture or focal lesion. Sinuses/Orbits: Mucosal thickening of the ethmoid air cells. Otherwise clear. Other: None. IMPRESSION: 1. No acute intracranial findings. 2. Chronic microvascular ischemic change and cerebral volume loss. Electronically Signed   By: Davina Poke D.O.   On: 07/18/2022 16:54   DG Chest 1 View  Result Date: 07/18/2022 CLINICAL DATA:  Respiratory distress. EXAM: CHEST  1 VIEW COMPARISON:  Chest radiograph 04/09/2021 FINDINGS: The heart is enlarged, unchanged. The upper mediastinal contours are normal There is no evidence of overt pulmonary edema. There is no focal airspace opacity. There is no significant pleural effusion. There is no pneumothorax. There is unchanged asymmetric elevation of the right hemidiaphragm. There is no acute osseous abnormality. IMPRESSION: Unchanged cardiomegaly and asymmetric elevation of the right hemidiaphragm. No focal  consolidation or evidence of overt pulmonary edema. Electronically Signed   By: Valetta Mole M.D.   On: 07/18/2022 13:54    Microbiology: Results for orders placed or performed during the hospital encounter of 07/18/22  Resp panel by RT-PCR (RSV, Flu A&B, Covid) Anterior Nasal Swab     Status: Abnormal   Collection Time: 07/18/22  1:23 PM   Specimen: Anterior Nasal Swab  Result Value Ref Range Status   SARS Coronavirus 2 by RT PCR NEGATIVE NEGATIVE Final    Comment: (NOTE) SARS-CoV-2 target nucleic acids are NOT DETECTED.  The SARS-CoV-2 RNA is generally detectable in upper respiratory specimens during the acute phase of infection. The lowest concentration of SARS-CoV-2 viral copies this assay can detect is 138 copies/mL. A negative result does not preclude SARS-Cov-2 infection and should not be used as the sole basis for treatment or other patient management decisions. A negative result may occur with   improper specimen collection/handling, submission of specimen other than nasopharyngeal swab, presence of viral mutation(s) within the areas targeted by this assay, and inadequate number of viral copies(<138 copies/mL). A negative result must be combined with clinical observations, patient history, and epidemiological information. The expected result is Negative.  Fact Sheet for Patients:  EntrepreneurPulse.com.au  Fact Sheet for Healthcare Providers:  IncredibleEmployment.be  This test is no t yet approved or cleared by the [place] FDA and  has been authorized for detection and/or diagnosis of SARS-CoV-2 by FDA under an Emergency Use Authorization (EUA). This EUA will remain  in effect (meaning this test can be used) for the duration of the COVID-19 declaration under Section 564(b)(1) of the Act, 21 U.S.C.section 360bbb-3(b)(1), unless the authorization is terminated  or revoked sooner.       Influenza A by PCR NEGATIVE NEGATIVE Final   Influenza B by PCR NEGATIVE NEGATIVE Final    Comment: (NOTE) The Xpert Xpress SARS-CoV-2/FLU/RSV plus assay is intended as an aid in the diagnosis of influenza from Nasopharyngeal swab specimens and should not be used as a sole basis for treatment. Nasal washings and aspirates are unacceptable for Xpert Xpress SARS-CoV-2/FLU/RSV testing.  Fact Sheet for Patients: EntrepreneurPulse.com.au  Fact Sheet for Healthcare Providers: IncredibleEmployment.be  This test is not yet approved or cleared by the [place] FDA and has been authorized for detection and/or diagnosis of SARS-CoV-2 by FDA under an Emergency Use Authorization (EUA). This EUA will remain in effect (meaning this test can be used) for the duration of the COVID-19 declaration under Section 564(b)(1) of the Act, 21 U.S.C. section 360bbb-3(b)(1), unless the authorization is terminated or revoked.      Resp Syncytial Virus by PCR POSITIVE (A) NEGATIVE Final    Comment: (NOTE) Fact Sheet for Patients: EntrepreneurPulse.com.au  Fact Sheet for Healthcare Providers: IncredibleEmployment.be  This test is not yet approved or cleared by the [place] FDA and has been authorized for detection and/or diagnosis of SARS-CoV-2 by FDA under an Emergency Use Authorization (EUA). This EUA will remain in effect (meaning this test can be used) for the duration of the COVID-19 declaration under Section 564(b)(1) of the Act, 21 U.S.C. section 360bbb-3(b)(1), unless the authorization is terminated or revoked.  Performed at [facility], [place] [address] [place], [place] [address]     Labs: CBC: Recent Labs  Lab 07/18/22 1323 07/18/22 1330 07/18/22 1800 07/19/22 0117  WBC 4.9  --   --  3.5*  NEUTROABS 3.8  --   --   --   HGB  9.9* 11.2* 12.9 10.6*  HCT 29.9* 33.0* 38.0 33.9*  MCV 93.1  --   --  95.2  PLT 166  --   --  616*   Basic Metabolic Panel: Recent Labs  Lab 07/18/22 1323 07/18/22 1330 07/18/22 1800 07/19/22 0117  NA 136 135 132* 134*  K 3.8 3.6 5.6* 3.6  CL 93* 92*  --  95*  CO2 30  --   --  25  GLUCOSE 81 81  --  111*  BUN 16 18  --  13  CREATININE 3.56* 3.80*  --  2.64*  CALCIUM 7.9*  --   --  8.1*  MG  --   --   --  1.8  PHOS  --   --   --  4.2   Liver Function Tests: Recent Labs  Lab 07/18/22 1323  AST 31  ALT 13  ALKPHOS 87  BILITOT 0.7  PROT 5.9*  ALBUMIN 2.9*   CBG: Recent Labs  Lab 07/20/22 1239 07/20/22 1635 07/20/22 2141 07/21/22 0758 07/21/22 1115  GLUCAP 244* 173* 169* 141* 225*    Discharge time spent: greater than 30 minutes.  Signed: Elmarie Shiley, MD [facility] 07/21/2022

## 2022-07-21 NOTE — [place] FL2 (Signed)
[place] MEDICAID FL2 LEVEL OF CARE FORM     IDENTIFICATION  Patient Name: [name] Michael Birthdate: 12/14/1960 Sex: female Admission Date (Current Location): 07/18/2022  [facility] and [place] Number:  Herbalist and Address:  [facility] [place], [place] [address], [place], [place] [address]      Provider Number: [id number]  Attending Physician Name and Address:  Elmarie Shiley, MD  Relative Name and Phone Number:  Deloria Lair [phone]    Current Level of Care: Hospital Recommended Level of Care: [place] Prior Approval Number:    Date Approved/Denied:   PASRR Number: [id number]  Discharge Plan: SNF    Current Diagnoses: Patient Active Problem List   Diagnosis Date Noted   RSV (respiratory syncytial virus pneumonia) 07/18/2022   Hypothyroidism 07/18/2022   Elevated troponin 07/18/2022   Tobacco abuse 07/18/2022   Acute on chronic respiratory failure with hypoxia ([place]) 02/18/2021   Pressure injury of skin 02/17/2021   Abdominal ascites 01/25/2021   Lymphocytopenia 10/13/2020   Ear hematoma, right, initial encounter 10/13/2020   Prolonged QT interval 10/13/2020   Lactic acidosis 10/05/2020   Transaminitis 10/05/2020   Thrombocytopenia ([place]) 10/05/2020   Acute respiratory failure with hypoxia in setting of RSV 10/05/2020   Altered mental status 84/69/6295   Acute metabolic encephalopathy 28/41/3244   Severe tricuspid regurgitation 12/04/2019   ESRD (end stage renal disease) ([place])    GERD (gastroesophageal reflux disease)    Hypertension    Type II diabetes mellitus ([place])    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   AMS (altered mental status) 04/28/2019   Hypoxemia 04/28/2019   Anemia of chronic disease 06/09/2018   Syncope and collapse 06/09/2018   Acute encephalopathy 03/10/2018   Hypermagnesemia 03/10/2018   Acute lower UTI 11/09/2016   Uncontrolled type 2 diabetes mellitus with complication    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 associated with diabetes mellitus due to underlying condition ([place])    Acute on chronic diastolic CHF (congestive heart failure) (HCC)    Hypokalemia 06/11/2016   Proliferative diabetic retinopathy ([place]) 04/29/2016   Poor social situation 03/09/2013   Depression 03/01/2013   Abnormal mammogram 12/20/2012   Retinal detachment 11/17/2012   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 ([place]) 06/25/7251   Hyperlipidemia 02/09/2007   Essential hypertension 02/09/2007    Orientation RESPIRATION BLADDER Height & Weight     Self, Time, Situation, Place  O2 (Nasal Cannula 2 liters) Incontinent, External catheter (External Urinary Catheter) Weight: 72 lb 11.2 oz (33 kg) Height:  4\' 6"  (137.2 cm)  BEHAVIORAL SYMPTOMS/MOOD NEUROLOGICAL BOWEL NUTRITION STATUS      Incontinent Diet (see dc summary)  AMBULATORY STATUS COMMUNICATION OF NEEDS Skin     Verbally Other (Comment) (WDL,Wound Incision LDAs)                       Personal Care Assistance Level of Assistance  Bathing, Feeding, Dressing Bathing Assistance: Limited assistance Feeding assistance: Limited assistance Dressing Assistance: Limited assistance     Functional Limitations Info  Sight, Hearing, Speech Sight Info: Adequate Hearing Info: Adequate Speech Info: Adequate    SPECIAL CARE FACTORS FREQUENCY                       Contractures Contractures Info: Not present    Additional Factors Info  Code Status, Allergies, Insulin Sliding Scale, Isolation Precautions Code Status Info: DNR Allergies  Info: Hydrocodone   Insulin Sliding Scale Info: insulin aspart (novoLOG) injection 0-6 Units 3 times daily with meals Isolation Precautions Info: ESBL     Current Medications (07/21/2022):  This is the current hospital active medication list Current Facility-Administered Medications  Medication Dose Route Frequency Provider Last Rate Last Admin   0.9 %   sodium chloride infusion  250 mL Intravenous PRN Orma Flaming, MD       acetaminophen (TYLENOL) tablet 650 mg  650 mg Oral Q6H PRN Orma Flaming, MD       Or   acetaminophen (TYLENOL) suppository 650 mg  650 mg Rectal Q6H PRN Orma Flaming, MD       albuterol (PROVENTIL) (2.5 MG/3ML) 0.083% nebulizer solution 2.5 mg  2.5 mg Nebulization Q2H PRN Orma Flaming, MD       aspirin chewable tablet 81 mg  81 mg Oral Daily Orma Flaming, MD   81 mg at 07/21/22 0946   budesonide (PULMICORT) nebulizer solution 0.25 mg  0.25 mg Nebulization BID Orma Flaming, MD   0.25 mg at 07/21/22 0744   carvedilol (COREG) tablet 6.25 mg  6.25 mg Oral BID WC Orma Flaming, MD   6.25 mg at 07/21/22 0946   doxercalciferol (HECTOROL) injection 4 mcg  4 mcg Intravenous Q T,Th,Sa-HD Roney Jaffe, MD   4 mcg at 07/19/22 1700   ferric gluconate (FERRLECIT) 125 mg in sodium chloride 0.9 % 100 mL IVPB  125 mg Intravenous Q T,Th,Sa-HD Roney Jaffe, MD   Stopped at 07/19/22 2228   heparin injection 5,000 Units  5,000 Units Subcutaneous Q8H Orma Flaming, MD   5,000 Units at 07/21/22 0636   hydrALAZINE (APRESOLINE) injection 10 mg  10 mg Intravenous Q8H PRN Orma Flaming, MD       insulin aspart (novoLOG) injection 0-6 Units  0-6 Units Subcutaneous TID WC Orma Flaming, MD   2 Units at 07/21/22 1305   ipratropium-albuterol (DUONEB) 0.5-2.5 (3) MG/3ML nebulizer solution 3 mL  3 mL Nebulization Q6H Regalado, [name], MD       levothyroxine (SYNTHROID) tablet 75 mcg  75 mcg Oral Q0600 Orma Flaming, MD   75 mcg at 07/21/22 8032   methylPREDNISolone sodium succinate (SOLU-MEDROL) 40 mg/mL injection 40 mg  40 mg Intravenous Daily Regalado, [name], MD   40 mg at 07/21/22 0946   pantoprazole (PROTONIX) EC tablet 80 mg  80 mg Oral Q1200 Orma Flaming, MD   80 mg at 07/21/22 1304   sodium chloride flush (NS) 0.9 % injection 3 mL  3 mL Intravenous Q12H Orma Flaming, MD   3 mL at 07/21/22 0947   sodium chloride flush (NS)  0.9 % injection 3 mL  3 mL Intravenous PRN Orma Flaming, MD         Discharge Medications: Please see discharge summary for a list of discharge medications.  Relevant Imaging Results:  Relevant Lab Results:   Additional Information SSN [id number] HD-[facility] T,TH,S  Milas Gain, [place]

## 2022-07-21 NOTE — Progress Notes (Signed)
Heart Failure Navigator Progress Note  Assessed for Heart & Vascular TOC clinic readiness.  Patient does not meet criteria due to ESRD on hemodialysis.   Navigator will sign off at this time.    Inetta Dicke, BSN, RN Heart Failure Nurse Navigator Secure Chat Only   

## 2022-07-21 NOTE — Progress Notes (Addendum)
Pt receives out-pt HD at [facility] on TTS. Pt admitted from snf. Will assist as needed.   Melven Sartorius Renal Navigator [phone]  Addendum at 3:55 pm: Advised by CSW of pt's d/c today back to snf. Contacted [facility] and spoke to [place]. Clinic advised pt will d/c today and should resume care tomorrow.

## 2022-07-21 NOTE — Progress Notes (Signed)
Report  called to [place]  Ander Purpura RN ).

## 2022-07-21 NOTE — Progress Notes (Addendum)
[facility] Progress Note   Subjective:  Seen in room - looks comfortable, eating breakfast. Denies CP or dyspnea.  Objective Vitals:   07/21/22 0340 07/21/22 0500 07/21/22 0738 07/21/22 0759  BP: 138/82  138/82   Pulse: 77  70 66  Resp: 16  16 15   Temp: 98.2 F (36.8 C)   (!) 97.4 F (36.3 C)  TempSrc: Oral   Axillary  SpO2: 93%  94% 99%  Weight: 33 kg 33 kg    Height:       Physical Exam General: Chronically ill appearing, frail woman, NAD Heart: RRR; no murmur Lungs: CTAB Abdomen: soft Extremities: no LE edema Dialysis Access:  L AVF + thrill  Additional Objective Labs: Basic Metabolic Panel: Recent Labs  Lab 07/18/22 1323 07/18/22 1330 07/18/22 1800 07/19/22 0117  NA 136 135 132* 134*  K 3.8 3.6 5.6* 3.6  CL 93* 92*  --  95*  CO2 30  --   --  25  GLUCOSE 81 81  --  111*  BUN 16 18  --  13  CREATININE 3.56* 3.80*  --  2.64*  CALCIUM 7.9*  --   --  8.1*  PHOS  --   --   --  4.2   Liver Function Tests: Recent Labs  Lab 07/18/22 1323  AST 31  ALT 13  ALKPHOS 87  BILITOT 0.7  PROT 5.9*  ALBUMIN 2.9*   CBC: Recent Labs  Lab 07/18/22 1323 07/18/22 1330 07/18/22 1800 07/19/22 0117  WBC 4.9  --   --  3.5*  NEUTROABS 3.8  --   --   --   HGB 9.9* 11.2* 12.9 10.6*  HCT 29.9* 33.0* 38.0 33.9*  MCV 93.1  --   --  95.2  PLT 166  --   --  149*  Studies/Results: ECHOCARDIOGRAM COMPLETE  Result Date: 07/19/2022    ECHOCARDIOGRAM REPORT   Patient Name:   [name] Michael Date of Exam: 07/19/2022 Medical Rec #:  [id number]      Height:       54.0 in Accession #:    [id number]     Weight:       80.0 lb Date of Birth:  [birth year]      BSA:          1.172 m Patient Age:    62 years       BP:           157/64 mmHg Patient Gender: F              HR:           74 bpm. Exam Location:  Inpatient Procedure: 2D Echo, Cardiac Doppler and Color Doppler Indications:    D78.24 Acute diastolic (congestive) heart failure  History:        Patient has prior history  of Echocardiogram examinations. Risk                 Factors:Diabetes, Dyslipidemia and Hypertension.  Sonographer:    Phineas Douglas Referring Phys: [id number] ALLISON WOLFE IMPRESSIONS  1. TV leaflets do not coapt; wide open TR; therefore cannot estimate PA pressure.  2. Left ventricular ejection fraction, by estimation, is 60 to 65%. The left ventricle has normal function. The left ventricle has no regional wall motion abnormalities. Left ventricular diastolic parameters were normal. There is the interventricular septum is flattened in systole and diastole, consistent with right ventricular pressure and volume overload.  3. Right ventricular systolic  function is normal. The right ventricular size is severely enlarged.  4. Left atrial size was mildly dilated.  5. Right atrial size was severely dilated.  6. The mitral valve is normal in structure. Mild mitral valve regurgitation. No evidence of mitral stenosis.  7. 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  8. The aortic valve is tricuspid. Aortic valve regurgitation is not visualized. No aortic stenosis is present.  9. The inferior vena cava is dilated in size with <50% respiratory variability, suggesting right atrial pressure of 15 mmHg. FINDINGS  Left Ventricle: Left ventricular ejection fraction, by estimation, is 60 to 65%. The left ventricle has normal function. The left ventricle has no regional wall motion abnormalities. The left ventricular internal cavity size was normal in size. There is  no left ventricular hypertrophy. The interventricular septum is flattened in systole and diastole, consistent with right ventricular pressure and volume overload. Left ventricular diastolic parameters were normal. Right Ventricle: The right ventricular size is severely enlarged. Right ventricular systolic function is normal. Left Atrium: Left atrial size was mildly dilated. Right Atrium: Right atrial size was severely dilated. Pericardium: There is no evidence of pericardial  effusion. Mitral Valve: The mitral valve is normal in structure. Mild mitral valve regurgitation. No evidence of mitral valve stenosis. Tricuspid Valve: The tricuspid valve is normal in structure. 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 No evidence of tricuspid stenosis. Aortic Valve: The aortic valve is tricuspid. Aortic valve regurgitation is not visualized. No aortic stenosis is present. Pulmonic Valve: The pulmonic valve was normal in structure. Pulmonic valve regurgitation is trivial. No evidence of pulmonic stenosis. Aorta: The aortic root is normal in size and structure. Venous: The inferior vena cava is dilated in size with less than 50% respiratory variability, suggesting right atrial pressure of 15 mmHg. IAS/Shunts: No atrial level shunt detected by color flow Doppler. Additional Comments: TV leaflets do not coapt; wide open TR; therefore cannot estimate PA pressure.  LEFT VENTRICLE PLAX 2D LVIDd:         4.40 cm      Diastology LVIDs:         2.60 cm      LV e' medial:    8.81 cm/s LV PW:         0.90 cm      LV E/e' medial:  9.6 LV IVS:        0.90 cm      LV e' lateral:   12.60 cm/s LVOT diam:     1.60 cm      LV E/e' lateral: 6.7 LV SV:         45 LV SV Index:   38 LVOT Area:     2.01 cm  LV Volumes (MOD) LV vol d, MOD A2C: 106.0 ml LV vol d, MOD A4C: 100.0 ml LV vol s, MOD A2C: 45.4 ml LV vol s, MOD A4C: 42.4 ml LV SV MOD A2C:     60.6 ml LV SV MOD A4C:     100.0 ml LV SV MOD BP:      60.4 ml RIGHT VENTRICLE             IVC RV Basal diam:  5.60 cm     IVC diam: 2.30 cm RV S prime:     11.50 cm/s TAPSE (M-mode): 2.0 cm LEFT ATRIUM             Index        RIGHT ATRIUM  Index LA diam:        3.40 cm 2.90 cm/m   RA Area:     21.60 cm LA Vol (A2C):   45.9 ml 39.17 ml/m  RA Volume:   65.60 ml  55.98 ml/m LA Vol (A4C):   38.2 ml 32.60 ml/m LA Biplane Vol: 42.6 ml 36.35 ml/m  AORTIC VALVE LVOT Vmax:   104.00 cm/s LVOT Vmean:  70.900 cm/s LVOT VTI:    0.222 m  AORTA Ao Root diam: 2.50 cm Ao  Asc diam:  2.70 cm MITRAL VALVE               TRICUSPID VALVE MV Area (PHT): 3.68 cm    TR Peak grad:   16.2 mmHg MV Decel Time: 206 msec    TR Vmax:        201.00 cm/s MV E velocity: 84.40 cm/s MV A velocity: 80.70 cm/s  SHUNTS MV E/A ratio:  1.05        Systemic VTI:  0.22 m                            Systemic Diam: 1.60 cm Kirk Ruths MD Electronically signed by Kirk Ruths MD Signature Date/Time: 07/19/2022/2:30:03 PM    Final     Medications:  sodium chloride     ferric gluconate (FERRLECIT) IVPB Stopped (07/19/22 2228)    aspirin  81 mg Oral Daily   budesonide (PULMICORT) nebulizer solution  0.25 mg Nebulization BID   carvedilol  6.25 mg Oral BID WC   doxercalciferol  4 mcg Intravenous Q T,Th,Sa-HD   heparin  5,000 Units Subcutaneous Q8H   insulin aspart  0-6 Units Subcutaneous TID WC   levothyroxine  75 mcg Oral Q0600   methylPREDNISolone (SOLU-MEDROL) injection  40 mg Intravenous Daily   pantoprazole  80 mg Oral Q1200   sodium chloride flush  3 mL Intravenous Q12H    Dialysis Orders: [place] [facility] 3:45[facility], 350/500, EDW 32.4kg, 2K/2Ca bath, LUA AVF, heparin 2000 units IV TIW - last HD 1/25, post hd wt 36.1 kg (3.7kg up) - venofer 100 mg IV tiw IV thru 2/01 - hectorol 4 mcg IV TIW - mircera 30 mcg IV q4 wks, last 12/09, due 1/06 (3 wks ago)   Assessment/ Plan: AHRF: Was not reaching outpatient dry weight, challenged here - now closer after serial HD. RSV: Per primary. HIV Screening positive: Confirmatory test pending. Likely false positive. Per primary. ESRD: Continue HD on usual TTS schedule - next tomorrow. HTN/ volume - as above. Continue midodrine.  Anemia of ESRD:  Hgb 10.6, no ESA for now. Secondary HPTH: Ca/Phos ok. Continue VDRA, hasn't been getting binder here - Phos ok for now. Hx CVA    Pollyann Kennedy 07/21/2022, 11:19 AM  [facility]  Nephrology attending: The patient was seen and examined.  Chart reviewed.  Agree with assessment and  plan as outlined above. The volume status improved after hemodialysis. Next HD tomorrow. Likely dc to SNF today.  Katheran James,  CKA

## 2022-07-21 NOTE — TOC Transition Note (Signed)
Transition of Care [facility]) - CM/SW Discharge Note   Patient Details  Name: [name] Michael MRN: [id number] Date of Birth: April 25, 1961  Transition [facility]) CM/SW Contact:  Milas Gain, [place] Phone Number: 07/21/2022, 1:40 PM   Clinical Narrative:     Patient will DC to: [facility]   Anticipated DC date: 07/21/2022  Family notified: [name]   Transport by: Corey Harold  ?  Per MD patient ready for DC to [place], patient, patient's family, and facility notified of DC. Discharge Summary sent to facility. RN given number for report tele# [phone] RM# 200 Bed A. DC packet on chart. DNR signed by MD attached to patients DC packet.Ambulance transport requested for patient.  CSW signing off.    Final next level of care: Skilled Nursing Facility Barriers to Discharge: No Barriers Identified   Patient Goals and CMS Choice CMS Medicare.gov Compare Post Acute Care list provided to:: Patient Choice offered to / list presented to : Patient  Discharge Placement                Patient chooses bed at: [facility] Patient to be transferred to facility by: [place] Name of family member notified: [name] Patient and family notified of of transfer: 07/21/22  Discharge Plan and Services Additional resources added to the After Visit Summary for   In-house Referral: Clinical Social Work                                   Social Determinants of Health (SDOH) Interventions SDOH Screenings   Depression (PHQ2-9): Low Risk  (05/23/2019)  Tobacco Use: High Risk (07/18/2022)     Readmission Risk Interventions    02/21/2021   10:09 AM 01/31/2021    3:06 PM 12/07/2019    3:33 PM  Readmission Risk Prevention Plan  Transportation Screening Complete Complete Complete  PCP or Specialist Appt within 3-5 Days   Complete  HRI or [place]   Complete  Social Work Consult for [place] Planning/Counseling   Complete  Palliative Care Screening   Not Applicable  Medication  Review Press photographer) Referral to Pharmacy Referral to Pharmacy Referral to Pharmacy  PCP or Specialist appointment within 3-5 days of discharge Complete Complete   HRI or Home Care Consult Complete Complete   SW Recovery Care/Counseling Consult Complete Complete   Palliative Care Screening Not Applicable Not Applicable   Skilled Nursing Facility Complete Complete

## 2022-07-21 NOTE — Plan of Care (Signed)

## 2022-07-21 NOTE — TOC Initial Note (Signed)
Transition of Care [facility]) - Initial/Assessment Note    Patient Details  Name: [name] Michael MRN: [id number] Date of Birth: Dec 29, 1960  Transition of Care [facility]) CM/SW Contact:    Milas Gain, [place] Phone Number: 07/21/2022, 11:18 AM  Clinical Narrative:                  CSW spoke with patient. Patient reports she comes from [facility] long term. Patient confirms her plan is to return back to [facility] when medically ready for dc. All questions answered. No further questions reported at this time. CSW spoke with Crystal with [facility] who confirmed patient comes from there long term and that patient can return back when medically ready for dc. CSW will continue to follow and assist with patients dc planning needs.  Expected Discharge Plan: Skilled Nursing Facility Barriers to Discharge: Continued Medical Work up   Patient Goals and CMS Choice Patient states their goals for this hospitalization and ongoing recovery are:: SNF CMS Medicare.gov Compare Post Acute Care list provided to:: Patient Choice offered to / list presented to : Patient      Expected Discharge Plan and Services In-house Referral: Clinical Social Work     Living arrangements for the past 2 months: [place]                                      Prior Living Arrangements/Services Living arrangements for the past 2 months: [place] Lives with:: Facility Resident Patient language and need for interpreter reviewed:: Yes Do you feel safe going back to the place where you live?: Yes      Need for Family Participation in Patient Care: Yes (Comment) Care giver support system in place?: Yes (comment)   Criminal Activity/Legal Involvement Pertinent to Current Situation/Hospitalization: No - Comment as needed  Activities of Daily Living      Permission Sought/Granted Permission sought to share information with : Case Manager, Family Supports, Forensic psychologist Permission granted to share information with : Yes, Verbal Permission Granted  Share Information with NAME: [name] Michael  Permission granted to share info w AGENCY: SNF  Permission granted to share info w Relationship: sister  Permission granted to share info w Contact Information: [name] [phone]  Emotional Assessment   Attitude/Demeanor/Rapport: Gracious Affect (typically observed): Calm Orientation: : Oriented to Self, Oriented to Place, Oriented to  Time, Oriented to Situation (WDL) Alcohol / Substance Use: Not Applicable Psych Involvement: No (comment)  Admission diagnosis:  Hypoxia [R09.02] ESRD (end stage renal disease) ([place]) [N18.6] Troponin level elevated [R79.89] Acute respiratory failure with hypoxia ([place]) [J96.01] RSV bronchitis [J20.5] Patient Active Problem List   Diagnosis Date Noted   RSV (respiratory syncytial virus pneumonia) 07/18/2022   Hypothyroidism 07/18/2022   Elevated troponin 07/18/2022   Tobacco abuse 07/18/2022   Acute on chronic respiratory failure with hypoxia ([place]) 02/18/2021   Pressure injury of skin 02/17/2021   Abdominal ascites 01/25/2021   Lymphocytopenia 10/13/2020   Ear hematoma, right, initial encounter 10/13/2020   Prolonged QT interval 10/13/2020   Lactic acidosis 10/05/2020   Transaminitis 10/05/2020   Thrombocytopenia ([place]) 10/05/2020   Acute respiratory failure with hypoxia in setting of RSV 10/05/2020   Altered mental status 00/93/8182   Acute metabolic encephalopathy 99/37/1696   Severe tricuspid regurgitation 12/04/2019   ESRD (end stage renal disease) ([place])    GERD (gastroesophageal reflux disease)  Hypertension    Type II diabetes mellitus ([place])    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   AMS (altered mental status) 04/28/2019   Hypoxemia 04/28/2019   Anemia of chronic disease 06/09/2018   Syncope and collapse 06/09/2018   Acute encephalopathy 03/10/2018   Hypermagnesemia 03/10/2018   Acute lower UTI  11/09/2016   Uncontrolled type 2 diabetes mellitus with complication    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 associated with diabetes mellitus due to underlying condition ([place])    Acute on chronic diastolic CHF (congestive heart failure) ([place])    Hypokalemia 06/11/2016   Proliferative diabetic retinopathy ([place]) 04/29/2016   Poor social situation 03/09/2013   Depression 03/01/2013   Abnormal mammogram 12/20/2012   Retinal detachment 11/17/2012   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 ([place]) 49/75/3005   Hyperlipidemia 02/09/2007   Essential hypertension 02/09/2007   PCP:  Seward Carol, MD Pharmacy:  No Pharmacies Listed    Social Determinants of Health (SDOH) Social History: SDOH Screenings   Depression (PHQ2-9): Low Risk  (05/23/2019)  Tobacco Use: High Risk (07/18/2022)   SDOH Interventions:     Readmission Risk Interventions    02/21/2021   10:09 AM 01/31/2021    3:06 PM 12/07/2019    3:33 PM  Readmission Risk Prevention Plan  Transportation Screening Complete Complete Complete  PCP or Specialist Appt within 3-5 Days   Complete  HRI or [place]   Complete  Social Work Consult for [place] Planning/Counseling   Complete  Palliative Care Screening   Not Applicable  Medication Review Press photographer) Referral to Pharmacy Referral to Pharmacy Referral to Pharmacy  PCP or Specialist appointment within 3-5 days of discharge Complete Complete   HRI or Home Care Consult Complete Complete   SW Recovery Care/Counseling Consult Complete Complete   Palliative Care Screening Not Applicable Not Applicable   Skilled Nursing Facility Complete Complete

## 2022-07-22 LAB — HEPATITIS B SURFACE ANTIBODY, QUANTITATIVE: Hep B S AB Quant (Post): 12.9 m[IU]/mL (ref >9.9)

## 2022-07-22 LAB — HIV-1 RNA QUANT-NO REFLEX-BLD
HIV 1 RNA Quant: <20 copies/mL
LOG10 HIV-1 RNA: UNDETERMINED log10copy/mL

## 2022-07-24 LAB — HIV-1/2 AB - DIFFERENTIATION
HIV 1 Ab: NONREACTIVE
HIV 2 Ab: NONREACTIVE
Note: NEGATIVE

## 2022-07-24 LAB — HIV-1/HIV-2 QUALITATIVE RNA
Final Interpretation: NEGATIVE
HIV-1 RNA, Qualitative: NONREACTIVE
HIV-2 RNA, Qualitative: NONREACTIVE

## 2022-07-25 ENCOUNTER — Telehealth: Payer: Self-pay | Admitting: Infectious Disease

## 2022-07-25 NOTE — Telephone Encounter (Signed)
I called pt to let her know that her HIV testing is negative

## 2022-08-03 ENCOUNTER — Encounter (HOSPITAL_COMMUNITY): Payer: Self-pay

## 2022-08-03 ENCOUNTER — Emergency Department (HOSPITAL_COMMUNITY)
Admission: EM | Admit: 2022-08-03 | Discharge: 2022-08-04 | Disposition: A | Discharge status: Home / Self Care | Payer: Medicare Other | Attending: Emergency Medicine | Admitting: Emergency Medicine

## 2022-08-03 ENCOUNTER — Emergency Department (HOSPITAL_COMMUNITY): Payer: Medicare Other

## 2022-08-03 ENCOUNTER — Other Ambulatory Visit: Payer: Self-pay

## 2022-08-03 DIAGNOSIS — S2231XA Fracture of one rib, right side, initial encounter for closed fracture: Secondary | ICD-10-CM | POA: Diagnosis not present

## 2022-08-03 DIAGNOSIS — Z7982 Long term (current) use of aspirin: Secondary | ICD-10-CM | POA: Insufficient documentation

## 2022-08-03 DIAGNOSIS — I120 Hypertensive chronic kidney disease with stage 5 chronic kidney disease or end stage renal disease: Secondary | ICD-10-CM | POA: Insufficient documentation

## 2022-08-03 DIAGNOSIS — E871 Hypo-osmolality and hyponatremia: Secondary | ICD-10-CM | POA: Diagnosis not present

## 2022-08-03 DIAGNOSIS — M542 Cervicalgia: Secondary | ICD-10-CM | POA: Diagnosis not present

## 2022-08-03 DIAGNOSIS — R739 Hyperglycemia, unspecified: Secondary | ICD-10-CM

## 2022-08-03 DIAGNOSIS — W010XXA Fall on same level from slipping, tripping and stumbling without subsequent striking against object, initial encounter: Secondary | ICD-10-CM | POA: Diagnosis not present

## 2022-08-03 DIAGNOSIS — Z79899 Other long term (current) drug therapy: Secondary | ICD-10-CM | POA: Diagnosis not present

## 2022-08-03 DIAGNOSIS — N186 End stage renal disease: Secondary | ICD-10-CM | POA: Insufficient documentation

## 2022-08-03 DIAGNOSIS — N189 Chronic kidney disease, unspecified: Secondary | ICD-10-CM

## 2022-08-03 DIAGNOSIS — R519 Headache, unspecified: Secondary | ICD-10-CM | POA: Diagnosis not present

## 2022-08-03 DIAGNOSIS — E1122 Type 2 diabetes mellitus with diabetic chronic kidney disease: Secondary | ICD-10-CM | POA: Diagnosis not present

## 2022-08-03 DIAGNOSIS — E1165 Type 2 diabetes mellitus with hyperglycemia: Secondary | ICD-10-CM | POA: Insufficient documentation

## 2022-08-03 DIAGNOSIS — S299XXA Unspecified injury of thorax, initial encounter: Secondary | ICD-10-CM | POA: Diagnosis present

## 2022-08-03 LAB — COMPREHENSIVE METABOLIC PANEL
ALT: 19 U/L (ref 0–44)
AST: 23 U/L (ref 15–41)
Albumin: 3 g/dL — ABNORMAL LOW (ref 3.5–5.0)
Alkaline Phosphatase: 155 U/L — ABNORMAL HIGH (ref 38–126)
Anion gap: 14 (ref 5–15)
BUN: 30 mg/dL — ABNORMAL HIGH (ref 8–23)
CO2: 28 mmol/L (ref 22–32)
Calcium: 8.8 mg/dL — ABNORMAL LOW (ref 8.9–10.3)
Chloride: 84 mmol/L — ABNORMAL LOW (ref 98–111)
Creatinine, Ser: 3.16 mg/dL — ABNORMAL HIGH (ref 0.44–1.00)
GFR, Estimated: 16 mL/min — ABNORMAL LOW (ref >60)
Glucose, Bld: 384 mg/dL — ABNORMAL HIGH (ref 70–99)
Potassium: 3.4 mmol/L — ABNORMAL LOW (ref 3.5–5.1)
Sodium: 126 mmol/L — ABNORMAL LOW (ref 135–145)
Total Bilirubin: 0.6 mg/dL (ref 0.3–1.2)
Total Protein: 6.8 g/dL (ref 6.5–8.1)

## 2022-08-03 LAB — PROTIME-INR
INR: 1.1 (ref 0.8–1.2)
Prothrombin Time: 13.6 seconds (ref 11.4–15.2)

## 2022-08-03 LAB — CBC
HCT: 33.4 % — ABNORMAL LOW (ref 36.0–46.0)
Hemoglobin: 10.9 g/dL — ABNORMAL LOW (ref 12.0–15.0)
MCH: 30.3 pg (ref 26.0–34.0)
MCHC: 32.6 g/dL (ref 30.0–36.0)
MCV: 92.8 fL (ref 80.0–100.0)
Platelets: 243 10*3/uL (ref 150–400)
RBC: 3.6 MIL/uL — ABNORMAL LOW (ref 3.87–5.11)
RDW: 15.9 % — ABNORMAL HIGH (ref 11.5–15.5)
WBC: 6.6 10*3/uL (ref 4.0–10.5)
nRBC: 0 % (ref 0.0–0.2)

## 2022-08-03 MED ORDER — INSULIN ASPART 100 UNIT/ML IJ SOLN
3.0000 [IU] | Freq: Once | INTRAMUSCULAR | Status: AC
  Administered 2022-08-03: 3 [IU] via INTRAVENOUS

## 2022-08-03 NOTE — ED Provider Notes (Signed)
[facility] Provider Note   CSN: [id number] Arrival date & time: 08/03/22  1455     History  Chief Complaint  Patient presents with   Fall    [name] Michael is a 62 y.o. female.   Fall     Patient is a resident of [facility].  Patient has a history of hypertension TIA, stroke, reflux, vitamin D deficiency, end-stage renal failure, diabetes, tricuspid regurgitation who presents to the ED for evaluation after fall.  Patient states she was trying to use her walker when it got caught on something and she fell backwards striking her head.  Patient is having pain in the back of her head as well as her neck.  She also has some discomfort in her right ribs.  Patient denies feeling sick or ill prior to the fall.  She is not having any abdominal pain.  No shortness of breath or leg pain.  Home Medications Prior to Admission medications   Medication Sig Start Date End Date Taking? Authorizing Provider  acetaminophen (TYLENOL) 500 MG tablet Take 500 mg by mouth every 6 (six) hours as needed for mild pain.    [provider]  aspirin 81 MG chewable tablet Chew 81 mg by mouth daily.    [provider]  atorvastatin (LIPITOR) 40 MG tablet Take 40 mg by mouth at bedtime.     [provider]  b complex-vitamin c-folic acid (NEPHRO-VITE) 0.8 MG TABS tablet Take 1 tablet by mouth at bedtime.    [provider]  carvedilol (COREG) 6.25 MG tablet Take 1 tablet (6.25 mg total) by mouth 2 (two) times daily with a meal. 02/22/21   Thurnell Lose, MD  cetirizine (ZYRTEC) 10 MG tablet Take 10 mg by mouth daily as needed for allergies.    [provider]  cloNIDine (CATAPRES) 0.1 MG tablet Take 0.1 mg by mouth every 8 (eight) hours as needed (BP>160/90).    [provider]  COMBIVENT RESPIMAT 20-100 MCG/ACT AERS respimat Inhale 1 puff into the lungs every 6 (six) hours as needed for wheezing or shortness  of breath. 07/14/22   [provider]  esomeprazole (NEXIUM) 40 MG capsule Take 40 mg by mouth daily.    [provider]  FLUoxetine (PROZAC) 10 MG capsule Take 10 mg by mouth daily. 12/21/20   [provider]  Lido-PE-Glycerin-Petrolatum (PREPARATION H RAPID RELIEF EX) Apply topically every 6 (six) hours as needed (hemorrhoid).    [provider]  loperamide (IMODIUM) 2 MG capsule Take 2 mg by mouth daily as needed for diarrhea or loose stools.    [provider]  midodrine (PROAMATINE) 10 MG tablet Take 10 mg by mouth See admin instructions. Take 1 tablet (10 mg) by mouth prior to dialysis on Tuesday, Thursday, Saturday    [provider]  mirtazapine (REMERON) 7.5 MG tablet Take 7.5 mg by mouth at bedtime. 06/27/22   [provider]  Nutritional Supplements (NOVASOURCE RENAL) LIQD Take 237 mLs by mouth with breakfast, with lunch, and with evening meal.    [provider]  omega-3 acid ethyl esters (LOVAZA) 1 g capsule Take 2 capsules by mouth 2 (two) times daily. 05/28/20   [provider]  ondansetron (ZOFRAN) 4 MG tablet Take 4 mg by mouth every 8 (eight) hours as needed for nausea or vomiting.    [provider]  SACCHAROMYCES BOULARDII PO Take 1 capsule by mouth 2 (two)  times daily.    [provider]  senna-docusate (SENOKOT-S) 8.6-50 MG tablet Take 1 tablet by mouth at bedtime as needed for mild constipation. 06/11/18   Regalado, [name], MD  sevelamer carbonate (RENVELA) 800 MG tablet Take 800 mg by mouth 3 (three) times daily with meals.    [provider]  Skin Protectants, Misc. (EUCERIN) cream Apply 1 Application topically 2 (two) times daily.    [provider]  SYNTHROID 88 MCG tablet Take 88 mcg by mouth daily before breakfast. 07/12/22   [provider]      Allergies    Hydrocodone    Review of Systems   Review of Systems  Physical Exam Updated Vital  Signs BP (!) 181/71   Pulse 83   Temp 98.1 F (36.7 C) (Oral)   Resp 18   Ht 1.372 m (4' 6"$ )   Wt 32.9 kg   LMP 03/06/2013 (LMP Unknown)   SpO2 99%   BMI 17.49 kg/m  Physical Exam Vitals and nursing note reviewed.  Constitutional:      Comments: Underweight, frail  HENT:     Head: Normocephalic and atraumatic.     Right Ear: External ear normal.     Left Ear: External ear normal.  Eyes:     General: No scleral icterus.       Right eye: No discharge.        Left eye: No discharge.     Conjunctiva/sclera: Conjunctivae normal.  Neck:     Trachea: No tracheal deviation.  Cardiovascular:     Rate and Rhythm: Normal rate and regular rhythm.  Pulmonary:     Effort: Pulmonary effort is normal. No respiratory distress.     Breath sounds: Normal breath sounds. No stridor. No wheezing or rales.  Chest:     Comments: Ttp right rib margin Abdominal:     General: Bowel sounds are normal. There is no distension.     Palpations: Abdomen is soft.     Tenderness: There is no abdominal tenderness. There is no guarding or rebound.  Musculoskeletal:        General: No deformity.     Right shoulder: Normal.     Left shoulder: Normal.     Right wrist: Normal.     Left wrist: Normal.     Cervical back: Neck supple. Tenderness present.     Thoracic back: No tenderness.     Lumbar back: No tenderness.     Right hip: Normal.     Left hip: Normal.     Right lower leg: Normal.     Left lower leg: Normal.  Skin:    General: Skin is warm and dry.     Findings: No rash.  Neurological:     General: No focal deficit present.     Mental Status: She is alert.     Cranial Nerves: No cranial nerve deficit, dysarthria or facial asymmetry.     Sensory: No sensory deficit.     Motor: No abnormal muscle tone or seizure activity.     Coordination: Coordination normal.  Psychiatric:        Mood and Affect: Mood normal.     ED Results / Procedures / Treatments   Labs (all labs ordered are  listed, but only abnormal results are displayed) Labs Reviewed  CBC - Abnormal; Notable for the following components:      Result Value   RBC 3.60 (*)    Hemoglobin 10.9 (*)  HCT 33.4 (*)    RDW 15.9 (*)    All other components within normal limits  COMPREHENSIVE METABOLIC PANEL - Abnormal; Notable for the following components:   Sodium 126 (*)    Potassium 3.4 (*)    Chloride 84 (*)    Glucose, Bld 384 (*)    BUN 30 (*)    Creatinine, Ser 3.16 (*)    Calcium 8.8 (*)    Albumin 3.0 (*)    Alkaline Phosphatase 155 (*)    GFR, Estimated 16 (*)    All other components within normal limits  PROTIME-INR    EKG EKG Interpretation  Date/Time:  Sunday August 03 2022 15:04:41 EST Ventricular Rate:  82 PR Interval:  193 QRS Duration: 154 QT Interval:  435 QTC Calculation: 509 R Axis:   156 Text Interpretation: Sinus rhythm Left atrial enlargement RBBB and LPFB No significant change since last tracing Confirmed by Dorie Rank [phone]) on 08/03/2022 4:06:58 PM  Radiology CT Head Wo Contrast  Result Date: 08/03/2022 CLINICAL DATA:  Head trauma.  Fall today. EXAM: CT HEAD WITHOUT CONTRAST CT CERVICAL SPINE WITHOUT CONTRAST TECHNIQUE: Multidetector CT imaging of the head and cervical spine was performed following the standard protocol without intravenous contrast. Multiplanar CT image reconstructions of the cervical spine were also generated. RADIATION DOSE REDUCTION: This exam was performed according to the departmental dose-optimization program which includes automated exposure control, adjustment of the mA and/or kV according to patient size and/or use of iterative reconstruction technique. COMPARISON:  04/17/2021. FINDINGS: CT HEAD FINDINGS Brain: No evidence of acute infarction, hemorrhage, hydrocephalus, extra-axial collection or mass lesion/mass effect. Vascular: No hyperdense vessel or unexpected calcification. Skull: Normal. Negative for fracture or focal lesion. Sinuses/Orbits:  Globes and orbits are unremarkable. Left maxillary sinus opacification. Most of the left ethmoid air cells are opacified. Mild right ethmoid air cell mucosal thickening. Left sphenoid sinus dependent fluid and mild mucosal thickening. Other: Small right posterior parietal/occipital scalp hematoma. CT CERVICAL SPINE FINDINGS Alignment: Normal. Skull base and vertebrae: No acute fracture. No primary bone lesion or focal pathologic process. Soft tissues and spinal canal: No prevertebral fluid or swelling. No visible canal hematoma. Disc levels: Disc spaces are well maintained. No significant disc bulging or evidence of a disc herniation. No stenosis. Upper chest: Negative. Other: None. IMPRESSION: HEAD CT 1. No acute intracranial abnormalities. 2. Right posterior parietal/occipital scalp hematoma. No skull fracture. 3. Sinus disease as detailed. CERVICAL CT 1. No fracture or acute finding. Electronically Signed   By: Lajean Manes M.D.   On: 08/03/2022 16:32   CT Cervical Spine Wo Contrast  Result Date: 08/03/2022 CLINICAL DATA:  Head trauma.  Fall today. EXAM: CT HEAD WITHOUT CONTRAST CT CERVICAL SPINE WITHOUT CONTRAST TECHNIQUE: Multidetector CT imaging of the head and cervical spine was performed following the standard protocol without intravenous contrast. Multiplanar CT image reconstructions of the cervical spine were also generated. RADIATION DOSE REDUCTION: This exam was performed according to the departmental dose-optimization program which includes automated exposure control, adjustment of the mA and/or kV according to patient size and/or use of iterative reconstruction technique. COMPARISON:  04/17/2021. FINDINGS: CT HEAD FINDINGS Brain: No evidence of acute infarction, hemorrhage, hydrocephalus, extra-axial collection or mass lesion/mass effect. Vascular: No hyperdense vessel or unexpected calcification. Skull: Normal. Negative for fracture or focal lesion. Sinuses/Orbits: Globes and orbits are  unremarkable. Left maxillary sinus opacification. Most of the left ethmoid air cells are opacified. Mild right ethmoid air cell mucosal thickening. Left sphenoid sinus dependent  fluid and mild mucosal thickening. Other: Small right posterior parietal/occipital scalp hematoma. CT CERVICAL SPINE FINDINGS Alignment: Normal. Skull base and vertebrae: No acute fracture. No primary bone lesion or focal pathologic process. Soft tissues and spinal canal: No prevertebral fluid or swelling. No visible canal hematoma. Disc levels: Disc spaces are well maintained. No significant disc bulging or evidence of a disc herniation. No stenosis. Upper chest: Negative. Other: None. IMPRESSION: HEAD CT 1. No acute intracranial abnormalities. 2. Right posterior parietal/occipital scalp hematoma. No skull fracture. 3. Sinus disease as detailed. CERVICAL CT 1. No fracture or acute finding. Electronically Signed   By: Lajean Manes M.D.   On: 08/03/2022 16:32   DG Ribs Unilateral W/Chest Right  Result Date: 08/03/2022 CLINICAL DATA:  Status post fall, right rib pain EXAM: RIGHT RIBS AND CHEST - 3+ VIEW COMPARISON:  None Available. FINDINGS: Multiple chronic right posterior rib fractures. Acute right lateral ninth rib fracture. No evidence of pneumothorax or pleural effusion. Both lungs are clear. Heart size and mediastinal contours are within normal limits. IMPRESSION: 1. Acute right lateral ninth rib fracture. 2. Multiple chronic right posterior rib fractures. Electronically Signed   By: Kathreen Devoid M.D.   On: 08/03/2022 15:48    Procedures Procedures    Medications Ordered in ED Medications  insulin aspart (novoLOG) injection 3 Units (has no administration in time range)    ED Course/ Medical Decision Making/ A&P                             Medical Decision Making Problems Addressed: Chronic kidney disease, unspecified CKD stage: chronic illness or injury Closed fracture of one rib of right side, initial encounter:  acute illness or injury that poses a threat to life or bodily functions Hyperglycemia: acute illness or injury Hyponatremia: acute illness or injury  Amount and/or Complexity of Data Reviewed Labs: ordered. Decision-making details documented in ED Course. Radiology: ordered and independent interpretation performed.  Risk Prescription drug management.   Patient presented to the ED for evaluation after a mechanical fall.  Patient has history of multiple medical problems and resides in a nursing facility.  She is at risk for severe injury and multiple comorbidities contributing to her fall.  Patient is ED workup does show stable anemia.  She has known chronic renal failure and has electrolyte abnormalities consistent with that.  Patient however is hyperglycemic today with a blood sugar of 384.  This is contributing to pseudohyponatremia..  Patient has not been having any issues with vomiting or diarrhea.  Her x-rays do not show any signs of serious head or C-spine injury.  She does have a rib fracture but is not having any significant pain or discomfort with that.  Patient appears appropriate for discharge back to the nursing facility.  She was given a small dose of insulin for her hyperglycemia.  Evaluation and diagnostic testing in the emergency department does not suggest an emergent condition requiring admission or immediate intervention beyond what has been performed at this time.  The patient is safe for discharge and has been instructed to return immediately for worsening symptoms, change in symptoms or any other concerns.        Final Clinical Impression(s) / ED Diagnoses Final diagnoses:  Hyperglycemia  Chronic kidney disease, unspecified CKD stage  Closed fracture of one rib of right side, initial encounter  Hyponatremia    Rx / DC Orders ED Discharge Orders  None         Dorie Rank, MD 08/03/22 [phone]

## 2022-08-03 NOTE — Discharge Instructions (Signed)
Take your medication such as Tylenol for your rib pain.  Your blood sugar was elevated today.  You were given an additional dose of insulin.  Make sure to monitor your blood sugar closely today.  Follow-up with your dialysis as planned.  Return to the ED as needed for worsening symptoms

## 2022-08-03 NOTE — ED Triage Notes (Signed)
Pt to ED via EMS from [facility]. Pt and a fall today after her walker got caught on something and she fell and hit the back of her head. She is c/o of head pain and right rib pain. Pt not on thinners, denies LOC. She arrives [facility] in triage, with a hematoma to the back of her head. No lacs or injuries noted.

## 2022-08-03 NOTE — ED Notes (Signed)
Patient transported to X-ray 

## 2022-08-13 ENCOUNTER — Non-Acute Institutional Stay: Payer: Medicare Other | Admitting: Hospice

## 2022-08-13 DIAGNOSIS — W19XXXD Unspecified fall, subsequent encounter: Secondary | ICD-10-CM

## 2022-08-13 DIAGNOSIS — N186 End stage renal disease: Secondary | ICD-10-CM

## 2022-08-13 DIAGNOSIS — Z515 Encounter for palliative care: Secondary | ICD-10-CM

## 2022-08-13 DIAGNOSIS — E43 Unspecified severe protein-calorie malnutrition: Secondary | ICD-10-CM

## 2022-08-13 DIAGNOSIS — R269 Unspecified abnormalities of gait and mobility: Secondary | ICD-10-CM

## 2022-08-13 DIAGNOSIS — R5381 Other malaise: Secondary | ICD-10-CM

## 2022-08-13 NOTE — Progress Notes (Signed)
**Note [name]-Identified via Obfuscation** [name] Michael Consult Note Telephone: [phone]  Fax: [phone]  PATIENT NAME: [name] Michael [name] Michael [address] [place] [address] [phone] (home)  DOB: February 10, 1961 MRN: [id number]  PRIMARY CARE PROVIDER:    Seward Carol, MD,  [facility] [address] [facility] [address] [address] [place] [address] [phone]  REFERRING PROVIDER:   Seward Carol, MD [address] [place],  [place] [address] [phone]  RESPONSIBLE PARTY:    Contact Information     Name Relation Home Work [name] Michael Sister [phone]     [name] Michael Niece [phone]  [phone]        I met face to face with patient in the facility. Visit to build trust and highlight Palliative Medicine as specialized medical care for people living with serious illness, aimed at facilitating better quality of life through symptoms relief, assisting with advance care planning and complex medical decision making.  Initial visit post hospitalization  Palliative care visit for discussion/monitor trends of appetite, weights, monitor for functional, cognitive decline with chronic disease progression, assess any active symptoms, supportive role.  Palliative care team will continue to support patient, patient's family, and medical team. ASSESSMENT AND / RECOMMENDATIONS:   Advance Care Planning: Our advance care planning conversation included a discussion about:    The value and importance of advance care planning  Difference between Hospice and Palliative care Exploration of goals of care in the event of a sudden injury or illness  Identification and preparation of a healthcare agent  Review and updating or creation of an  advance directive document . Decision not to resuscitate or to [name]-escalate disease focused treatments due to poor prognosis.  CODE STATUS: Patient confirmed she is a DO NOT RESUSCITATE  Goals of Care: Goals include to maximize  quality of life and symptom management  I spent  16 minutes providing this initial consultation. More than 50% of the time in this consultation was spent on counseling patient and coordinating communication. --------------------------------------------------------------------------------------------------------------------------------------  Symptom Management/Plan: Debility: Worsening debility secondary to end-stage renal disease on hemodialysis, history of CHF, hypertension, TIA, stroke, reflux, tricuspid regurgitation.  Recent hospitalization last month for acute respiratory failure with hypoxia in setting of RSV. Fall/gait disturbance: Worsening gait, seen in ED for fall 08/03/2022.  Continue PT/OT for strengthening and gait training.  Fall precautions discussed. End-stage renal disease: Continue Tue Thur Sat dialysis. Continue Renvela and renal diet. Adhere to 1500 cc total fluid restriction. Protein caloric malnutrition: Worsening abnormal weight loss current weight is 72 pounds down  91 Ibs early last month.  Albumin 3.0 08/03/2022 down from 3.7 05/19/22. Continue to set up/offer help during meals to ensure adequate oral intake.  Diet-regular diet, mechanical soft.  Continue nova resource renal nutritional drink.  Mirtazapine 7.5 mg at bedtime for depression and to  help boost appetite.   Routine CBC CMP Depression: Stable. Managed with mirtazapine and Prozac.  Constipation: Stable. Miralax daily.  Follow up: Palliative care will continue to follow for complex medical decision making, advance care planning, and clarification of goals. Return 6 weeks or prn. Encouraged to call provider sooner with any concerns.   Family /Caregiver/Community Supports: Patient in SNF for ongoing care  HOSPICE ELIGIBILITY/DIAGNOSIS: TBD  Chief Complaint: Initial Palliative care visit  HISTORY OF PRESENT ILLNESS:  [name] Michael is a 62 y.o. year old female  with multiple morbidities requiring close monitoring  and with high risk of complications and  mortality: Worsening debility, ESRD  on dialysis, CVA, CHF, gait disturbance, depression, protein caloric malnutrition. Patient denies pain/discomfort.  Patient endorsed weakness, denies dizziness/pain/discomfort. History obtained from review of EMR, discussion with primary team, caregiver, family and/or [name] Michael.  Review and summarization of Epic records shows history from other than patient. Rest of 10 point ROS asked and negative. Independent interpretation of tests and reviewed as needed, available labs, patient records, imaging, studies and related documents from the EMR.   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 ([facility]) 123456   Hyperlipidemia 02/09/2007   Essential hypertension 02/09/2007   Retinal detachment 11/17/2012   Abnormal mammogram 12/20/2012   Depression 03/01/2013   Poor social situation 03/09/2013   Proliferative diabetic retinopathy ([place]) 04/29/2016   Hypokalemia 06/11/2016   Uncontrolled type 2 diabetes mellitus with complication    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 associated with diabetes mellitus due to underlying condition ([place] [facility])    Acute on chronic diastolic CHF (congestive heart failure) ([place])    Acute lower UTI 11/09/2016   Acute encephalopathy 03/10/2018   Hypermagnesemia 03/10/2018   Anemia of chronic disease 06/09/2018   Syncope and collapse 06/09/2018   AMS (altered mental status) 04/28/2019   Hypoxemia 04/28/2019   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   ESRD (end stage renal disease) ([place])    GERD (gastroesophageal reflux disease)    Hypertension    Type II diabetes mellitus ([place])    Severe tricuspid regurgitation 123XX123   Acute metabolic encephalopathy 99991111   Altered mental status 08/22/2020   Lactic acidosis 10/05/2020   Transaminitis 10/05/2020   Thrombocytopenia ([place])  10/05/2020   Acute respiratory failure with hypoxia in setting of RSV 10/05/2020   Lymphocytopenia 10/13/2020   Ear hematoma, right, initial encounter 10/13/2020   Prolonged QT interval 10/13/2020   Abdominal ascites 01/25/2021   Pressure injury of skin 02/17/2021   Acute on chronic respiratory failure with hypoxia ([place]) 02/18/2021   RSV (respiratory syncytial virus pneumonia) 07/18/2022   Hypothyroidism 07/18/2022   Elevated troponin 07/18/2022   Tobacco abuse 07/18/2022   Resolved Ambulatory Problems    Diagnosis Date Noted   GERD 02/09/2007   Closed fracture of left distal radius 01/28/2016   Hypocalcemia 06/11/2016   Acute kidney injury superimposed on chronic kidney disease ([place]) 06/11/2016   Hypophosphatemia 06/12/2016   Abdominal pain    Colitis    CHF (congestive heart failure) ([place])    Sepsis secondary to UTI ([place])    UTI due to extended-spectrum beta lactamase (ESBL) producing Escherichia coli    Acute pyelonephritis    Bacteremia due to Escherichia coli    Colitis, indeterminate    CVA (cerebral vascular accident) ([facility]) 07/03/2016   HAP (hospital-acquired pneumonia) 0000000   Metabolic acidosis 0000000   Constipation 10/22/2016   CAP (community acquired pneumonia) 10/22/2016   Hydronephrosis of right kidney 10/22/2016   Nephrolithiasis 10/24/2016   AKI (acute kidney injury) ([place] [facility]) 11/09/2016   Hypercalcemia 11/09/2016   Hyponatremia 11/09/2016   Hypovolemia 11/09/2016   Accelerated hypertension 11/09/2016   CKD (chronic kidney disease) stage 5, GFR less than 15 ml/min ([place]) 03/10/2018   Fall 06/09/2018   Fall at home, initial encounter 06/09/2018   Syncope 12/03/2019   Wrist pain, acute, right    Weakness    Acute on chronic right-sided heart failure ([place])    Hypoxia    Stroke (cerebrum) ([place] [facility]) 03/29/2020   Change in mental  status 03/30/2020   Past Medical History:  Diagnosis Date   Ambulates with cane    History of stress test    Osteoporosis     Stroke ([facility]) 04-01-11   TIA (transient ischemic attack) 03-12-11   Tricuspid regurgitation    Vitamin D deficiency     SOCIAL HX:  Social History   Tobacco Use   Smoking status: Every Day    Packs/day: 0.30    Years: 32.00    Total pack years: 9.60    Types: Cigarettes    Last attempt to quit: 09/23/2012    Years since quitting: 9.8    Passive exposure: Current   Smokeless tobacco: Never   Tobacco comments:    8 cigarettes/day  Substance Use Topics   Alcohol use: No     FAMILY HX:  Family History  Problem Relation Age of Onset   Stroke Mother    Diabetes Mother    Kidney failure Mother    Heart failure Mother    Stroke Father    Cancer Sister        Breast- 71's   Colon cancer Maternal Grandmother    Pancreatic cancer Neg Hx    Esophageal cancer Neg Hx    Liver disease Neg Hx    Stomach cancer Neg Hx       ALLERGIES:  Allergies  Allergen Reactions   Hydrocodone Nausea And Vomiting and Other (See Comments)    "Allergic," per MAR      PERTINENT MEDICATIONS:  Outpatient Encounter Medications as of 08/13/2022  Medication Sig   acetaminophen (TYLENOL) 500 MG tablet Take 500 mg by mouth every 6 (six) hours as needed for mild pain.   aspirin 81 MG chewable tablet Chew 81 mg by mouth daily.   atorvastatin (LIPITOR) 40 MG tablet Take 40 mg by mouth at bedtime.    b complex-vitamin c-folic acid (NEPHRO-VITE) 0.8 MG TABS tablet Take 1 tablet by mouth at bedtime.   carvedilol (COREG) 6.25 MG tablet Take 1 tablet (6.25 mg total) by mouth 2 (two) times daily with a meal.   cetirizine (ZYRTEC) 10 MG tablet Take 10 mg by mouth daily as needed for allergies.   cloNIDine (CATAPRES) 0.1 MG tablet Take 0.1 mg by mouth every 8 (eight) hours as needed (BP>160/90).   COMBIVENT RESPIMAT 20-100 MCG/ACT AERS respimat Inhale 1 puff into the lungs every 6 (six) hours as needed for wheezing or shortness of breath.   esomeprazole (NEXIUM) 40 MG capsule Take 40 mg by mouth daily.    FLUoxetine (PROZAC) 10 MG capsule Take 10 mg by mouth daily.   Lido-PE-Glycerin-Petrolatum (PREPARATION H RAPID RELIEF EX) Apply topically every 6 (six) hours as needed (hemorrhoid).   loperamide (IMODIUM) 2 MG capsule Take 2 mg by mouth daily as needed for diarrhea or loose stools.   midodrine (PROAMATINE) 10 MG tablet Take 10 mg by mouth See admin instructions. Take 1 tablet (10 mg) by mouth prior to dialysis on Tuesday, Thursday, Saturday   mirtazapine (REMERON) 7.5 MG tablet Take 7.5 mg by mouth at bedtime.   Nutritional Supplements (NOVASOURCE RENAL) LIQD Take 237 mLs by mouth with breakfast, with lunch, and with evening meal.   omega-3 acid ethyl esters (LOVAZA) 1 g capsule Take 2 capsules by mouth 2 (two) times daily.   ondansetron (ZOFRAN) 4 MG tablet Take 4 mg by mouth every 8 (eight) hours as needed for nausea or vomiting.   SACCHAROMYCES BOULARDII PO Take 1 capsule by mouth 2 (two)  times daily.   senna-docusate (SENOKOT-S) 8.6-50 MG tablet Take 1 tablet by mouth at bedtime as needed for mild constipation.   sevelamer carbonate (RENVELA) 800 MG tablet Take 800 mg by mouth 3 (three) times daily with meals.   Skin Protectants, Misc. (EUCERIN) cream Apply 1 Application topically 2 (two) times daily.   SYNTHROID 88 MCG tablet Take 88 mcg by mouth daily before breakfast.   No facility-administered encounter medications on file as of 08/13/2022.     Thank you for the opportunity to participate in the care of Ms. Cobbins.  The palliative care team will continue to follow. Please call our office at [phone] if we can be of additional assistance.   Note: Portions of this note were generated with Lobbyist. Dictation errors may occur despite best attempts at proofreading.  Teodoro Spray, NP

## 2022-09-17 ENCOUNTER — Non-Acute Institutional Stay: Payer: Medicare Other | Admitting: Hospice

## 2022-09-17 DIAGNOSIS — K5901 Slow transit constipation: Secondary | ICD-10-CM

## 2022-09-17 DIAGNOSIS — Z515 Encounter for palliative care: Secondary | ICD-10-CM

## 2022-09-17 DIAGNOSIS — E43 Unspecified severe protein-calorie malnutrition: Secondary | ICD-10-CM

## 2022-09-17 DIAGNOSIS — F339 Major depressive disorder, recurrent, unspecified: Secondary | ICD-10-CM

## 2022-09-17 DIAGNOSIS — N186 End stage renal disease: Secondary | ICD-10-CM

## 2022-09-17 DIAGNOSIS — R5381 Other malaise: Secondary | ICD-10-CM

## 2022-09-17 NOTE — Progress Notes (Signed)
[facility] Consult Note Telephone: [phone]  Fax: [phone]  PATIENT NAME: [name] Michael [address] [place] [address] [phone] (home)  DOB: [birth year] MRN: [id number]  PRIMARY CARE PROVIDER:    Seward Carol, MD,  [facility] [place] [address] [place] [address] [phone]  REFERRING PROVIDER:   Seward Carol, MD [address] [address] [place],  [place] [address] [phone]  RESPONSIBLE PARTY:    Contact Information     Name Relation Home Work [place] Sister [phone]     [place] Niece [phone]  [phone]        I met face to face with patient in the facility. Visit to build trust and highlight Palliative Medicine as specialized medical care for people living with serious illness, aimed at facilitating better quality of life through symptoms relief, assisting with advance care planning and complex medical decision making.  Palliative care visit for discussion/monitor trends of appetite, weights, monitor for functional, cognitive decline with chronic disease progression, assess any active symptoms, supportive role.  Palliative care team will continue to support patient, patient's family, and medical team. ASSESSMENT AND / RECOMMENDATIONS:   CODE STATUS: Patient confirmed she is a DO NOT RESUSCITATE  Goals of Care: Goals include to maximize quality of life and symptom management  Symptom Management/Plan: Debility: debility secondary to comorbidities including end-stage renal disease on hemodialysis, history of CHF, hypertension, protein caloric malnutrition, abnormal weight loss, TIA, stroke, reflux, depression, tricuspid regurgitation.  Smoking cessation discussed.  Continue ongoing supportive care.  Fall/gait disturbance:  Continue PT/OT for strengthening and gait training.  Fall precautions discussed.  End-stage renal disease: Continue Tue Thur Sat  dialysis. Continue Renvela and renal diet. Adhere to 1500 cc total fluid restriction.  Protein caloric malnutrition: Albumin 3.0 08/03/2022 down from 3.7 05/19/22;  abnormal weight loss current weight is 78.4 pounds. Continue to set up/offer help during meals to ensure adequate oral intake.  Diet-regular diet, mechanical soft.  Continue nova resource renal nutritional drink.  Mirtazapine 7.5 mg at bedtime for depression and to  help boost appetite.   Routine CBC CMP Depression: Stable. Managed with mirtazapine and Prozac.  Constipation: Stable. Miralax daily.  Follow up: Palliative care will continue to follow for complex medical decision making, advance care planning, and clarification of goals. Return 6 weeks or prn. Encouraged to call provider sooner with any concerns.   Family /Caregiver/Community Supports: Patient in SNF for ongoing care  HOSPICE ELIGIBILITY/DIAGNOSIS: TBD  Chief Complaint: f/u visit  HISTORY OF PRESENT ILLNESS:  [name] Michael is a 62 y.o. year old female  with multiple morbidities requiring close monitoring and with high risk of complications and  mortality: Worsening debility, ESRD on dialysis, CVA, CHF, gait disturbance, depression, protein caloric malnutrition. Patient denies pain/discomfort.  Patient endorsed weakness and reports ongoing therapy is helpful.  History obtained from review of EMR, discussion with primary team, caregiver, family and/or Ms. Hendershott.  Review and summarization of Epic records shows history from other than patient. Rest of 10 point ROS asked and negative. Independent interpretation of tests and reviewed as needed, available labs, patient records, imaging, studies and related documents from the EMR.   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 ([facility]) 123456   Hyperlipidemia 02/09/2007   Essential hypertension 02/09/2007   Retinal detachment  11/17/2012   Abnormal mammogram  12/20/2012   Depression 03/01/2013   Poor social situation 03/09/2013   Proliferative diabetic retinopathy ([place]) 04/29/2016   Hypokalemia 06/11/2016   Uncontrolled type 2 diabetes mellitus with complication    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 associated with diabetes mellitus due to underlying condition ([place])    Acute on chronic diastolic CHF (congestive heart failure) ([place])    Acute lower UTI 11/09/2016   Acute encephalopathy 03/10/2018   Hypermagnesemia 03/10/2018   Anemia of chronic disease 06/09/2018   Syncope and collapse 06/09/2018   AMS (altered mental status) 04/28/2019   Hypoxemia 04/28/2019   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   ESRD (end stage renal disease) ([place] [facility])    GERD (gastroesophageal reflux disease)    Hypertension    Type II diabetes mellitus ([place])    Severe tricuspid regurgitation 123XX123   Acute metabolic encephalopathy 99991111   Altered mental status 08/22/2020   Lactic acidosis 10/05/2020   Transaminitis 10/05/2020   Thrombocytopenia ([facility]) 10/05/2020   Acute respiratory failure with hypoxia in setting of RSV 10/05/2020   Lymphocytopenia 10/13/2020   Ear hematoma, right, initial encounter 10/13/2020   Prolonged QT interval 10/13/2020   Abdominal ascites 01/25/2021   Pressure injury of skin 02/17/2021   Acute on chronic respiratory failure with hypoxia ([facility]) 02/18/2021   RSV (respiratory syncytial virus pneumonia) 07/18/2022   Hypothyroidism 07/18/2022   Elevated troponin 07/18/2022   Tobacco abuse 07/18/2022   Resolved Ambulatory Problems    Diagnosis Date Noted   GERD 02/09/2007   Closed fracture of left distal radius 01/28/2016   Hypocalcemia 06/11/2016   Acute kidney injury superimposed on chronic kidney disease ([place]) 06/11/2016   Hypophosphatemia 06/12/2016   Abdominal pain    Colitis    CHF (congestive heart failure) ([place])    Sepsis secondary to UTI ([place])    UTI due to  extended-spectrum beta lactamase (ESBL) producing Escherichia coli    Acute pyelonephritis    Bacteremia due to Escherichia coli    Colitis, indeterminate    CVA (cerebral vascular accident) ([facility]) 07/03/2016   HAP (hospital-acquired pneumonia) 0000000   Metabolic acidosis 0000000   Constipation 10/22/2016   CAP (community acquired pneumonia) 10/22/2016   Hydronephrosis of right kidney 10/22/2016   Nephrolithiasis 10/24/2016   AKI (acute kidney injury) ([place]) 11/09/2016   Hypercalcemia 11/09/2016   Hyponatremia 11/09/2016   Hypovolemia 11/09/2016   Accelerated hypertension 11/09/2016   CKD (chronic kidney disease) stage 5, GFR less than 15 ml/min ([place]) 03/10/2018   Fall 06/09/2018   Fall at home, initial encounter 06/09/2018   Syncope 12/03/2019   Wrist pain, acute, right    Weakness    Acute on chronic right-sided heart failure ([place])    Hypoxia    Stroke (cerebrum) ([place]) 03/29/2020   Change in mental status 03/30/2020   Past Medical History:  Diagnosis Date   Ambulates with cane    History of stress test    Osteoporosis    Stroke ([place]) 04-01-11   TIA (transient ischemic attack) 03-12-11   Tricuspid regurgitation    Vitamin D deficiency     SOCIAL HX:  Social History   Tobacco Use   Smoking status: Every Day    Packs/day: 0.30    Years: 32.00    Additional pack years: 0.00    Total pack years: 9.60    Types: Cigarettes    Last attempt to quit: 09/23/2012    Years since quitting: 9.9    Passive exposure: Current   Smokeless  tobacco: Never   Tobacco comments:    8 cigarettes/day  Substance Use Topics   Alcohol use: No     FAMILY HX:  Family History  Problem Relation Age of Onset   Stroke Mother    Diabetes Mother    Kidney failure Mother    Heart failure Mother    Stroke Father    Cancer Sister        Breast- 31's   Colon cancer Maternal Grandmother    Pancreatic cancer Neg Hx    Esophageal cancer Neg Hx    Liver disease Neg Hx    Stomach cancer  Neg Hx       ALLERGIES:  Allergies  Allergen Reactions   Hydrocodone Nausea And Vomiting and Other (See Comments)    "Allergic," per MAR      PERTINENT MEDICATIONS:  Outpatient Encounter Medications as of 09/17/2022  Medication Sig   acetaminophen (TYLENOL) 500 MG tablet Take 500 mg by mouth every 6 (six) hours as needed for mild pain.   aspirin 81 MG chewable tablet Chew 81 mg by mouth daily.   atorvastatin (LIPITOR) 40 MG tablet Take 40 mg by mouth at bedtime.    b complex-vitamin c-folic acid (NEPHRO-VITE) 0.8 MG TABS tablet Take 1 tablet by mouth at bedtime.   carvedilol (COREG) 6.25 MG tablet Take 1 tablet (6.25 mg total) by mouth 2 (two) times daily with a meal.   cetirizine (ZYRTEC) 10 MG tablet Take 10 mg by mouth daily as needed for allergies.   cloNIDine (CATAPRES) 0.1 MG tablet Take 0.1 mg by mouth every 8 (eight) hours as needed (BP>160/90).   COMBIVENT RESPIMAT 20-100 MCG/ACT AERS respimat Inhale 1 puff into the lungs every 6 (six) hours as needed for wheezing or shortness of breath.   esomeprazole (NEXIUM) 40 MG capsule Take 40 mg by mouth daily.   FLUoxetine (PROZAC) 10 MG capsule Take 10 mg by mouth daily.   Lido-PE-Glycerin-Petrolatum (PREPARATION H RAPID RELIEF EX) Apply topically every 6 (six) hours as needed (hemorrhoid).   loperamide (IMODIUM) 2 MG capsule Take 2 mg by mouth daily as needed for diarrhea or loose stools.   midodrine (PROAMATINE) 10 MG tablet Take 10 mg by mouth See admin instructions. Take 1 tablet (10 mg) by mouth prior to dialysis on Tuesday, Thursday, Saturday   mirtazapine (REMERON) 7.5 MG tablet Take 7.5 mg by mouth at bedtime.   Nutritional Supplements (NOVASOURCE RENAL) LIQD Take 237 mLs by mouth with breakfast, with lunch, and with evening meal.   omega-3 acid ethyl esters (LOVAZA) 1 g capsule Take 2 capsules by mouth 2 (two) times daily.   ondansetron (ZOFRAN) 4 MG tablet Take 4 mg by mouth every 8 (eight) hours as needed for nausea or  vomiting.   SACCHAROMYCES BOULARDII PO Take 1 capsule by mouth 2 (two) times daily.   senna-docusate (SENOKOT-S) 8.6-50 MG tablet Take 1 tablet by mouth at bedtime as needed for mild constipation.   sevelamer carbonate (RENVELA) 800 MG tablet Take 800 mg by mouth 3 (three) times daily with meals.   Skin Protectants, Misc. (EUCERIN) cream Apply 1 Application topically 2 (two) times daily.   SYNTHROID 88 MCG tablet Take 88 mcg by mouth daily before breakfast.   No facility-administered encounter medications on file as of 09/17/2022.   I spent 40 minutes providing this consultation; this includes time spent with patient/family, chart review and documentation. More than 50% of the time in this consultation was spent on counseling and coordinating  communication   Thank you for the opportunity to participate in the care of Ms. Sayavong.  The palliative care team will continue to follow. Please call our office at [phone] if we can be of additional assistance.   Note: Portions of this note were generated with Lobbyist. Dictation errors may occur despite best attempts at proofreading.  Teodoro Spray, NP

## 2022-10-27 ENCOUNTER — Non-Acute Institutional Stay: Payer: Medicare Other | Admitting: Hospice

## 2022-10-27 DIAGNOSIS — E43 Unspecified severe protein-calorie malnutrition: Secondary | ICD-10-CM

## 2022-10-27 DIAGNOSIS — K5901 Slow transit constipation: Secondary | ICD-10-CM

## 2022-10-27 DIAGNOSIS — Z515 Encounter for palliative care: Secondary | ICD-10-CM

## 2022-10-27 DIAGNOSIS — F339 Major depressive disorder, recurrent, unspecified: Secondary | ICD-10-CM

## 2022-10-27 DIAGNOSIS — M545 Low back pain, unspecified: Secondary | ICD-10-CM

## 2022-10-27 DIAGNOSIS — Z992 Dependence on renal dialysis: Secondary | ICD-10-CM

## 2022-10-27 NOTE — Progress Notes (Signed)
Therapist, nutritional Palliative Care Consult Note Telephone: [phone]  Fax: [phone]  PATIENT NAME: [name] Michael [address] [place] [address] [phone] (home)  DOB: [birth year] MRN: [id number]  PRIMARY CARE PROVIDER:    Dr. Glean Salen  REFERRING PROVIDER:   Renford Dills, MD [address] [place],  [place] [address] [phone]  RESPONSIBLE PARTY:    Contact Information     Name Relation Home Work Mobile   [place] Sister [phone]     Jackie Plum Niece [phone]  [phone]        I met face to face with patient in the facility. Visit to build trust and highlight Palliative Medicine as specialized medical care for people living with serious illness, aimed at facilitating better quality of life through symptoms relief.  Palliative was asked to see patient for advance care planning and complex medical decision making.   Palliative care team will continue to support patient, patient's family, and medical team. ASSESSMENT AND / RECOMMENDATIONS:   CODE STATUS: DO NOT RESUSCITATE  Goals of Care: Goals include to maximize quality of life and symptom management  Symptom Management/Plan  End-stage renal disease: Fluid overload managed with hemodialysis.  Continue Tue Thur Sat dialysis. Continue Renvela and renal diet. Adhere to 1500 cc total fluid restriction.  Monitor for edema, ascites, shortness of breath, chest pain.  Protein caloric malnutrition: Check CBC CMP.  Current albumin 3.0 08/03/2022 down from 3.7 05/19/22; current weight 81 pounds 10/24/2022 up from 75 pounds 09/22/2022.  Dialysis is planned for tomorrow.  Continue to monitor weight closely as ordered.  Continue to set up/offer help during meals to ensure adequate oral intake.  Diet-regular diet, mechanical soft.  Continue nova resource renal nutritional drink.    Depression: Stable. Managed with mirtazapine and Prozac.   Pain:  Chronic low back pain without sciatica.  Continue Tylenol as needed ordered.  Constipation: Stable.  Continue senna S and Miralax as ordered.  Follow up: Palliative care will continue to follow for complex medical decision making, advance care planning, and clarification of goals. Return 6 weeks or prn. Encouraged to call provider sooner with any concerns.   Family /Caregiver/Community Supports: Patient in SNF for ongoing care  HOSPICE ELIGIBILITY/DIAGNOSIS: TBD  Chief Complaint: f/u visit  HISTORY OF PRESENT ILLNESS:  [name] Michael is a 62 y.o. year old female  with multiple morbidities requiring close monitoring and with high risk of complications and  mortality: Worsening debility, ESRD on dialysis, CVA, CHF, gait disturbance, depression, protein caloric malnutrition. Patient denies pain/discomfort.  Patient endorsed weakness and reports ongoing therapy is helpful.  History obtained from review of EMR, discussion with primary team, caregiver, family and/or Ms. Maimone.  Review and summarization of Epic records shows history from other than patient. Rest of 10 point ROS asked and negative. Independent interpretation of tests and reviewed as needed, available labs, patient records, imaging, studies and related documents from the EMR.   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) 02/09/2007   Hyperlipidemia 02/09/2007   Essential hypertension 02/09/2007   Retinal detachment 11/17/2012   Abnormal mammogram 12/20/2012   Depression 03/01/2013   Poor social situation 03/09/2013   Proliferative diabetic retinopathy (HCC) 04/29/2016   Hypokalemia 06/11/2016   Uncontrolled type 2 diabetes mellitus with complication    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 associated with  diabetes mellitus due to underlying condition (HCC)    Acute on chronic diastolic CHF (congestive heart  failure) (HCC)    Acute lower UTI 11/09/2016   Acute encephalopathy 03/10/2018   Hypermagnesemia 03/10/2018   Anemia of chronic disease 06/09/2018   Syncope and collapse 06/09/2018   AMS (altered mental status) 04/28/2019   Hypoxemia 04/28/2019   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   ESRD (end stage renal disease) (HCC)    GERD (gastroesophageal reflux disease)    Hypertension    Type II diabetes mellitus (HCC)    Severe tricuspid regurgitation 12/04/2019   Acute metabolic encephalopathy 08/21/2020   Altered mental status 08/22/2020   Lactic acidosis 10/05/2020   Transaminitis 10/05/2020   Thrombocytopenia (HCC) 10/05/2020   Acute respiratory failure with hypoxia in setting of RSV 10/05/2020   Lymphocytopenia 10/13/2020   Ear hematoma, right, initial encounter 10/13/2020   Prolonged QT interval 10/13/2020   Abdominal ascites 01/25/2021   Pressure injury of skin 02/17/2021   Acute on chronic respiratory failure with hypoxia (HCC) 02/18/2021   RSV (respiratory syncytial virus pneumonia) 07/18/2022   Hypothyroidism 07/18/2022   Elevated troponin 07/18/2022   Tobacco abuse 07/18/2022   Resolved Ambulatory Problems    Diagnosis Date Noted   GERD 02/09/2007   Closed fracture of left distal radius 01/28/2016   Hypocalcemia 06/11/2016   Acute kidney injury superimposed on chronic kidney disease (HCC) 06/11/2016   Hypophosphatemia 06/12/2016   Abdominal pain    Colitis    CHF (congestive heart failure) (HCC)    Sepsis secondary to UTI (HCC)    UTI due to extended-spectrum beta lactamase (ESBL) producing Escherichia coli    Acute pyelonephritis    Bacteremia due to Escherichia coli    Colitis, indeterminate    CVA (cerebral vascular accident) (HCC) 07/03/2016   HAP (hospital-acquired pneumonia) 07/03/2016   Metabolic acidosis 08/19/2016   Constipation 10/22/2016   CAP (community acquired pneumonia) 10/22/2016   Hydronephrosis of right kidney 10/22/2016   Nephrolithiasis  10/24/2016   AKI (acute kidney injury) (HCC) 11/09/2016   Hypercalcemia 11/09/2016   Hyponatremia 11/09/2016   Hypovolemia 11/09/2016   Accelerated hypertension 11/09/2016   CKD (chronic kidney disease) stage 5, GFR less than 15 ml/min (HCC) 03/10/2018   Fall 06/09/2018   Fall at home, initial encounter 06/09/2018   Syncope 12/03/2019   Wrist pain, acute, right    Weakness    Acute on chronic right-sided heart failure (HCC)    Hypoxia    Stroke (cerebrum) (HCC) 03/29/2020   Change in mental status 03/30/2020   Past Medical History:  Diagnosis Date   Ambulates with cane    History of stress test    Osteoporosis    Stroke (HCC) 04-01-11   TIA (transient ischemic attack) 03-12-11   Tricuspid regurgitation    Vitamin D deficiency     SOCIAL HX:  Social History   Tobacco Use   Smoking status: Every Day    Packs/day: 0.30    Years: 32.00    Additional pack years: 0.00    Total pack years: 9.60    Types: Cigarettes    Last attempt to quit: 09/23/2012    Years since quitting: 10.0    Passive exposure: Current   Smokeless tobacco: Never   Tobacco comments:    8 cigarettes/day  Substance Use Topics   Alcohol use: No     FAMILY HX:  Family History  Problem Relation Age of Onset   Stroke Mother    Diabetes  Mother    Kidney failure Mother    Heart failure Mother    Stroke Father    Cancer Sister        Breast- 68's   Colon cancer Maternal Grandmother    Pancreatic cancer Neg Hx    Esophageal cancer Neg Hx    Liver disease Neg Hx    Stomach cancer Neg Hx       ALLERGIES:  Allergies  Allergen Reactions   Hydrocodone Nausea And Vomiting and Other (See Comments)    "Allergic," per MAR      PERTINENT MEDICATIONS:  Outpatient Encounter Medications as of 10/27/2022  Medication Sig   acetaminophen (TYLENOL) 500 MG tablet Take 500 mg by mouth every 6 (six) hours as needed for mild pain.   aspirin 81 MG chewable tablet Chew 81 mg by mouth daily.   atorvastatin  (LIPITOR) 40 MG tablet Take 40 mg by mouth at bedtime.    b complex-vitamin c-folic acid (NEPHRO-VITE) 0.8 MG TABS tablet Take 1 tablet by mouth at bedtime.   carvedilol (COREG) 6.25 MG tablet Take 1 tablet (6.25 mg total) by mouth 2 (two) times daily with a meal.   cetirizine (ZYRTEC) 10 MG tablet Take 10 mg by mouth daily as needed for allergies.   cloNIDine (CATAPRES) 0.1 MG tablet Take 0.1 mg by mouth every 8 (eight) hours as needed (BP>160/90).   COMBIVENT RESPIMAT 20-100 MCG/ACT AERS respimat Inhale 1 puff into the lungs every 6 (six) hours as needed for wheezing or shortness of breath.   esomeprazole (NEXIUM) 40 MG capsule Take 40 mg by mouth daily.   FLUoxetine (PROZAC) 10 MG capsule Take 10 mg by mouth daily.   Lido-PE-Glycerin-Petrolatum (PREPARATION H RAPID RELIEF EX) Apply topically every 6 (six) hours as needed (hemorrhoid).   loperamide (IMODIUM) 2 MG capsule Take 2 mg by mouth daily as needed for diarrhea or loose stools.   midodrine (PROAMATINE) 10 MG tablet Take 10 mg by mouth See admin instructions. Take 1 tablet (10 mg) by mouth prior to dialysis on Tuesday, Thursday, Saturday   mirtazapine (REMERON) 7.5 MG tablet Take 7.5 mg by mouth at bedtime.   Nutritional Supplements (NOVASOURCE RENAL) LIQD Take 237 mLs by mouth with breakfast, with lunch, and with evening meal.   omega-3 acid ethyl esters (LOVAZA) 1 g capsule Take 2 capsules by mouth 2 (two) times daily.   ondansetron (ZOFRAN) 4 MG tablet Take 4 mg by mouth every 8 (eight) hours as needed for nausea or vomiting.   SACCHAROMYCES BOULARDII PO Take 1 capsule by mouth 2 (two) times daily.   senna-docusate (SENOKOT-S) 8.6-50 MG tablet Take 1 tablet by mouth at bedtime as needed for mild constipation.   sevelamer carbonate (RENVELA) 800 MG tablet Take 800 mg by mouth 3 (three) times daily with meals.   Skin Protectants, Misc. (EUCERIN) cream Apply 1 Application topically 2 (two) times daily.   SYNTHROID 88 MCG tablet Take 88 mcg  by mouth daily before breakfast.   No facility-administered encounter medications on file as of 10/27/2022.   I spent 40 minutes providing this consultation; this includes time spent with patient/family, chart review and documentation. More than 50% of the time in this consultation was spent on counseling and coordinating communication   Thank you for the opportunity to participate in the care of Ms. Eustache.  The palliative care team will continue to follow. Please call our office at [phone] if we can be of additional assistance.   Note: Portions of  this note were generated with Scientist, clinical (histocompatibility and immunogenetics). Dictation errors may occur despite best attempts at proofreading.  Rosaura Carpenter, NP

## 2022-11-29 ENCOUNTER — Emergency Department (HOSPITAL_COMMUNITY)
Admission: EM | Admit: 2022-11-29 | Discharge: 2022-11-29 | Disposition: A | Discharge status: Home / Self Care | Payer: Medicare Other | Attending: Emergency Medicine | Admitting: Emergency Medicine

## 2022-11-29 ENCOUNTER — Emergency Department (HOSPITAL_COMMUNITY): Payer: Medicare Other

## 2022-11-29 ENCOUNTER — Other Ambulatory Visit: Payer: Self-pay

## 2022-11-29 ENCOUNTER — Encounter (HOSPITAL_COMMUNITY): Payer: Self-pay | Admitting: Emergency Medicine

## 2022-11-29 DIAGNOSIS — R930 Abnormal findings on diagnostic imaging of skull and head, not elsewhere classified: Secondary | ICD-10-CM | POA: Diagnosis not present

## 2022-11-29 DIAGNOSIS — W1812XA Fall from or off toilet with subsequent striking against object, initial encounter: Secondary | ICD-10-CM | POA: Diagnosis not present

## 2022-11-29 DIAGNOSIS — Z7982 Long term (current) use of aspirin: Secondary | ICD-10-CM | POA: Insufficient documentation

## 2022-11-29 DIAGNOSIS — M25561 Pain in right knee: Secondary | ICD-10-CM | POA: Diagnosis not present

## 2022-11-29 DIAGNOSIS — W19XXXA Unspecified fall, initial encounter: Secondary | ICD-10-CM

## 2022-11-29 DIAGNOSIS — S01512A Laceration without foreign body of oral cavity, initial encounter: Secondary | ICD-10-CM | POA: Diagnosis present

## 2022-11-29 DIAGNOSIS — H579 Unspecified disorder of eye and adnexa: Secondary | ICD-10-CM

## 2022-11-29 MED ORDER — CHLORHEXIDINE GLUCONATE 0.12 % MT SOLN: 15.0000 mL | Freq: Two times a day (BID) | OROMUCOSAL | 0 refills | Status: DC

## 2022-11-29 MED ORDER — CHLORHEXIDINE GLUCONATE 0.12 % MT SOLN: 15.0000 mL | Freq: Two times a day (BID) | OROMUCOSAL | 0 refills | Status: AC

## 2022-11-29 NOTE — ED Notes (Signed)
[facility] called to transport patient to [facility]

## 2022-11-29 NOTE — ED Provider Notes (Signed)
[place] [facility] DEPARTMENT AT [facility] Provider Note   CSN: [id number] Arrival date & time: 11/29/22  0309     History  Chief Complaint  Patient presents with   [name] Michael    [name] Michael is a 62 y.o. female.  The history is provided by the patient, the EMS personnel and medical records.  Fall This is a new problem. The current episode started less than 1 hour ago. The problem occurs rarely. The problem has been resolved. Pertinent negatives include no chest pain, no abdominal pain, no headaches and no shortness of breath. Nothing aggravates the symptoms. Nothing relieves the symptoms. She has tried nothing for the symptoms. The treatment provided no relief.  Mechanical fall off toilet, patient states she has not seen out of her left eye in some time.       Home Medications Prior to Admission medications   Medication Sig Start Date End Date Taking? Authorizing Provider  acetaminophen (TYLENOL) 500 MG tablet Take 500 mg by mouth every 6 (six) hours as needed for mild pain.    [provider]  aspirin 81 MG chewable tablet Chew 81 mg by mouth daily.    [provider]  atorvastatin (LIPITOR) 40 MG tablet Take 40 mg by mouth at bedtime.     [provider]  b complex-vitamin c-folic acid (NEPHRO-VITE) 0.8 MG TABS tablet Take 1 tablet by mouth at bedtime.    [provider]  carvedilol (COREG) 6.25 MG tablet Take 1 tablet (6.25 mg total) by mouth 2 (two) times daily with a meal. 02/22/21   Leroy Sea, MD  cetirizine (ZYRTEC) 10 MG tablet Take 10 mg by mouth daily as needed for allergies.    [provider]  cloNIDine (CATAPRES) 0.1 MG tablet Take 0.1 mg by mouth every 8 (eight) hours as needed (BP>160/90).    [provider]  COMBIVENT RESPIMAT 20-100 MCG/ACT AERS respimat Inhale 1 puff into the lungs every 6 (six) hours as needed for wheezing or shortness of breath. 07/14/22   [provider]  esomeprazole  (NEXIUM) 40 MG capsule Take 40 mg by mouth daily.    [provider]  FLUoxetine (PROZAC) 10 MG capsule Take 10 mg by mouth daily. 12/21/20   [provider]  Lido-PE-Glycerin-Petrolatum (PREPARATION H RAPID RELIEF EX) Apply topically every 6 (six) hours as needed (hemorrhoid).    [provider]  loperamide (IMODIUM) 2 MG capsule Take 2 mg by mouth daily as needed for diarrhea or loose stools.    [provider]  midodrine (PROAMATINE) 10 MG tablet Take 10 mg by mouth See admin instructions. Take 1 tablet (10 mg) by mouth prior to dialysis on Tuesday, Thursday, Saturday    [provider]  mirtazapine (REMERON) 7.5 MG tablet Take 7.5 mg by mouth at bedtime. 06/27/22   [provider]  Nutritional Supplements (NOVASOURCE RENAL) LIQD Take 237 mLs by mouth with breakfast, with lunch, and with evening meal.    [provider]  omega-3 acid ethyl esters (LOVAZA) 1 g capsule Take 2 capsules by mouth 2 (two) times daily. 05/28/20   [provider]  ondansetron (ZOFRAN) 4 MG tablet Take 4 mg by mouth every 8 (eight) hours as needed for nausea or vomiting.    [provider]  SACCHAROMYCES BOULARDII PO Take 1 capsule by mouth 2 (two) times daily.    [provider]  senna-docusate (SENOKOT-S) 8.6-50 MG tablet Take 1 tablet by mouth at  bedtime as needed for mild constipation. 06/11/18   Regalado, [name], MD  sevelamer carbonate (RENVELA) 800 MG tablet Take 800 mg by mouth 3 (three) times daily with meals.    [provider]  Skin Protectants, Misc. (EUCERIN) cream Apply 1 Application topically 2 (two) times daily.    [provider]  SYNTHROID 88 MCG tablet Take 88 mcg by mouth daily before breakfast. 07/12/22   [provider]      Allergies    Hydrocodone    Review of Systems   Review of Systems  Constitutional:  Negative for fever.  HENT:  Negative for ear discharge.   Eyes:  Negative for  photophobia, pain, discharge, redness and itching.  Respiratory:  Negative for shortness of breath.   Cardiovascular:  Negative for chest pain.  Gastrointestinal:  Negative for abdominal pain.  Musculoskeletal:  Negative for neck pain and neck stiffness.  Neurological:  Negative for weakness, numbness and headaches.    Physical Exam Updated Vital Signs BP (!) 178/72   Pulse 68   Temp 97.7 F (36.5 C) (Oral)   Resp 18   Ht 4\' 6"  (1.372 m)   Wt 37.2 kg   LMP 03/06/2013 (LMP Unknown)   SpO2 95%   BMI 19.77 kg/m  Physical Exam Vitals and nursing note reviewed.  Constitutional:      General: She is not in acute distress.    Appearance: Normal appearance. She is well-developed.  HENT:     Head: Normocephalic and atraumatic.     Nose: Nose normal.     Comments: No septal hematoma  Eyes:     Extraocular Movements: Extraocular movements intact.     Conjunctiva/sclera: Conjunctivae normal.     Pupils: Pupils are equal, round, and reactive to light.     Comments: Lids everted, no foreign bodies, no hyphema no subconjunctival hemorrhage.  Exopthalmos, no hemorrhage seen on fundoscopy by me   Cardiovascular:     Rate and Rhythm: Normal rate and regular rhythm.     Pulses: Normal pulses.     Heart sounds: Normal heart sounds.  Pulmonary:     Effort: Pulmonary effort is normal. No respiratory distress.     Breath sounds: Normal breath sounds.  Abdominal:     General: Bowel sounds are normal. There is no distension.     Palpations: Abdomen is soft.     Tenderness: There is no abdominal tenderness. There is no guarding or rebound.  Genitourinary:    Vagina: No vaginal discharge.  Musculoskeletal:        General: Normal range of motion.     Cervical back: Neck supple.  Skin:    General: Skin is dry.     Capillary Refill: Capillary refill takes less than 2 seconds.     Findings: No erythema or rash.  Neurological:     General: No focal deficit present.     Mental Status: She is  alert.     Deep Tendon Reflexes: Reflexes normal.  Psychiatric:        Mood and Affect: Mood normal.     ED Results / Procedures / Treatments   Labs (all labs ordered are listed, but only abnormal results are displayed) Labs Reviewed - No data to display  EKG None  Radiology CT Head Wo Contrast  Result Date: 11/29/2022 CLINICAL DATA:  Plain facial trauma due to fall. EXAM: CT HEAD WITHOUT CONTRAST CT MAXILLOFACIAL WITHOUT CONTRAST CT CERVICAL SPINE WITHOUT CONTRAST TECHNIQUE: Multidetector CT imaging  of the head, cervical spine, and maxillofacial structures were performed using the standard protocol without intravenous contrast. Multiplanar CT image reconstructions of the cervical spine and maxillofacial structures were also generated. RADIATION DOSE REDUCTION: This exam was performed according to the departmental dose-optimization program which includes automated exposure control, adjustment of the mA and/or kV according to patient size and/or use of iterative reconstruction technique. COMPARISON:  08/03/2022 head and cervical spine CT FINDINGS: CT HEAD FINDINGS Brain: No evidence of acute infarction, hemorrhage, hydrocephalus, extra-axial collection or mass lesion/mass effect. 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 Vascular: No hyperdense vessel or unexpected calcification. Skull: No evidence of injury CT MAXILLOFACIAL FINDINGS Osseous: No acute fracture.  Negative for mandibular dislocation. Orbits: Rounded high-density area in the lower globe at the level of the vitreous, measuring 4 mm Sinuses: Negative for hemosinus Soft tissues: No hematoma. 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 CT CERVICAL SPINE FINDINGS Alignment: No traumatic malalignment Skull base and vertebrae: No acute fracture. No primary bone lesion  or focal pathologic process. Soft tissues and spinal canal: No prevertebral fluid or swelling. No visible canal hematoma. Disc levels: Ordinary degenerative changes without high-grade bony stenosis. Upper chest: No evidence of injury IMPRESSION: 1. No evidence of acute intracranial or cervical spine injury. 2. Negative for facial fracture. 3. 4 mm high-density area newly seen in the left globe, correlate with fundoscopy. Electronically Signed   By: Tiburcio Pea M.D.   On: 11/29/2022 04:17   CT Maxillofacial Wo Contrast  Result Date: 11/29/2022 CLINICAL DATA:  Plain facial trauma due to fall. EXAM: CT HEAD WITHOUT CONTRAST CT MAXILLOFACIAL WITHOUT CONTRAST CT CERVICAL SPINE WITHOUT CONTRAST TECHNIQUE: Multidetector CT imaging of the head, cervical spine, and maxillofacial structures were performed using the standard protocol without intravenous contrast. Multiplanar CT image reconstructions of the cervical spine and maxillofacial structures were also generated. RADIATION DOSE REDUCTION: This exam was performed according to the departmental dose-optimization program which includes automated exposure control, adjustment of the mA and/or kV according to patient size and/or use of iterative reconstruction technique. COMPARISON:  08/03/2022 head and cervical spine CT FINDINGS: CT HEAD FINDINGS Brain: No evidence of acute infarction, hemorrhage, hydrocephalus, extra-axial collection or mass lesion/mass effect. 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 Vascular: No hyperdense vessel or unexpected calcification. Skull: No evidence of injury CT MAXILLOFACIAL FINDINGS Osseous: No acute fracture.  Negative for mandibular dislocation. Orbits: Rounded high-density area in the lower globe at the level of the vitreous, measuring 4 mm Sinuses: Negative for hemosinus Soft tissues: No hematoma. 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 CT CERVICAL SPINE FINDINGS Alignment: No traumatic malalignment Skull base and vertebrae: No acute fracture. No primary bone lesion or focal pathologic process. Soft tissues and spinal canal: No prevertebral fluid or swelling. No visible canal hematoma. Disc levels: Ordinary degenerative changes without high-grade bony stenosis. Upper chest: No evidence of injury IMPRESSION: 1. No evidence of acute intracranial or cervical spine injury. 2. Negative for facial fracture. 3. 4 mm high-density area newly seen in the left globe, correlate with fundoscopy. Electronically Signed   By: Tiburcio Pea M.D.   On: 11/29/2022 04:17   CT Cervical Spine  Wo Contrast  Result Date: 11/29/2022 CLINICAL DATA:  Plain facial trauma due to fall. EXAM: CT HEAD WITHOUT CONTRAST CT MAXILLOFACIAL WITHOUT CONTRAST CT CERVICAL SPINE WITHOUT CONTRAST TECHNIQUE: Multidetector CT imaging of the head, cervical spine, and maxillofacial structures were performed using the standard protocol without intravenous contrast. Multiplanar CT image reconstructions of the cervical spine and maxillofacial structures were also generated. RADIATION DOSE REDUCTION: This exam was performed according to the departmental dose-optimization program which includes automated exposure control, adjustment of the mA and/or kV according to patient size and/or use of iterative reconstruction technique. COMPARISON:  08/03/2022 head and cervical spine CT FINDINGS: CT HEAD FINDINGS Brain: No evidence of acute infarction, hemorrhage, hydrocephalus, extra-axial collection or mass lesion/mass effect. 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 Vascular: No hyperdense vessel or unexpected calcification. Skull: No evidence of injury CT MAXILLOFACIAL FINDINGS Osseous: No acute fracture.  Negative for mandibular dislocation. Orbits: Rounded high-density  area in the lower globe at the level of the vitreous, measuring 4 mm Sinuses: Negative for hemosinus Soft tissues: No hematoma. 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 CT CERVICAL SPINE FINDINGS Alignment: No traumatic malalignment Skull base and vertebrae: No acute fracture. No primary bone lesion or focal pathologic process. Soft tissues and spinal canal: No prevertebral fluid or swelling. No visible canal hematoma. Disc levels: Ordinary degenerative changes without high-grade bony stenosis. Upper chest: No evidence of injury IMPRESSION: 1. No evidence of acute intracranial or cervical spine injury. 2. Negative for facial fracture. 3. 4 mm high-density area newly seen in the left globe, correlate with fundoscopy. Electronically Signed   By: Tiburcio Pea M.D.   On: 11/29/2022 04:17   DG Knee Complete 4 Views Right  Result Date: 11/29/2022 CLINICAL DATA:  Fall on right knee EXAM: RIGHT KNEE - COMPLETE 4+ VIEW COMPARISON:  Radiographs 10/13/2020 FINDINGS: No evidence of fracture, dislocation, or joint effusion. No evidence of arthropathy or other focal bone abnormality. Soft tissues are unremarkable. IMPRESSION: Negative. Electronically Signed   By: Minerva Fester M.D.   On: 11/29/2022 03:50    Procedures Procedures    Medications Ordered in ED Medications - No data to display  ED Course/ Medical Decision Making/ A&P                             Medical Decision Making Patient with a fall, denies eye pain or trauma   Amount and/or Complexity of Data Reviewed External Data Reviewed: radiology and notes.    Details: Previous notes reviewed  Radiology: ordered and independent interpretation performed.    Details: No bleed in the brain by me  Discussion of management or test interpretation with external provider(s): Case d/w Dr. Dione Booze, do not [name] the eye.   Likely secondary to her ongoing issues.  She will need to be seen in clinic for thorough evaluation   Risk Prescription drug management. Risk Details: Laceration inside mouth inside upper lip, hemostatic,  tea bag applied.  Will start peridex and soft mechanical diet.  Patient will need to follow up with Dr. Towanda Malkin for dilated eye exam to better assess globe lesion seen on CT scan.  Stable for discharge with close follow up     Final Clinical Impression(s) / ED Diagnoses Final diagnoses:  Fall, initial encounter   Return  for intractable cough, coughing up blood, fevers > 100.4 unrelieved by medication, shortness of breath, intractable vomiting, chest pain, shortness of breath, weakness, numbness, changes in speech, facial asymmetry, abdominal pain, passing out, Inability to tolerate liquids or food, cough, altered mental status or any concerns. No signs of systemic illness or infection. The patient is nontoxic-appearing on exam and vital signs are within normal limits.  I have reviewed the triage vital signs and the nursing notes. Pertinent labs & imaging results that were available during my care of the patient were reviewed by me and considered in my medical decision making (see chart for details). After history, exam, and medical workup I feel the patient has been appropriately medically screened and is safe for discharge home. Pertinent diagnoses were discussed with the patient. Patient was given return precautions Rx / DC Orders ED Discharge Orders     None         Arien Morine, MD 11/29/22 1610

## 2022-11-29 NOTE — ED Notes (Signed)
Unable to do appropriate visual screening d/t patient positioning and posture, but patient only able to read 1 inch font at approx 5 feet.  Unable to read anything smaller.  Patient reports "it's too blurry to see."

## 2022-11-29 NOTE — ED Notes (Signed)
Warm teabag applied to inside of lip per verbal order by MD Palumbo to assist in the control of bleeding.

## 2022-11-29 NOTE — ED Triage Notes (Addendum)
Patient BIB [facility] from [facility] c/o a mechanical fall off the toilet.  Patient reports striking her mouth.  Patient has small laceration on the inside of her top lip, bleeding controlled.  Patient not on blood thinners, bleeding is controlled.  Patient denies LOC. Patient also endorses right knee pain from fall.

## 2022-12-04 ENCOUNTER — Other Ambulatory Visit: Payer: Self-pay

## 2022-12-04 ENCOUNTER — Observation Stay (HOSPITAL_COMMUNITY): Payer: Medicare Other

## 2022-12-04 ENCOUNTER — Inpatient Hospital Stay (HOSPITAL_COMMUNITY)
Admission: EM | Admit: 2022-12-04 | Discharge: 2022-12-07 | DRG: 291 | Disposition: A | Discharge status: Skilled Nursing Facility | Payer: Medicare Other | Source: Other Acute Inpatient Hospital | Attending: Internal Medicine | Admitting: Internal Medicine

## 2022-12-04 ENCOUNTER — Encounter (HOSPITAL_COMMUNITY): Payer: Self-pay | Admitting: Nephrology

## 2022-12-04 ENCOUNTER — Emergency Department (HOSPITAL_COMMUNITY): Payer: Medicare Other

## 2022-12-04 DIAGNOSIS — Z66 Do not resuscitate: Secondary | ICD-10-CM | POA: Diagnosis present

## 2022-12-04 DIAGNOSIS — E1122 Type 2 diabetes mellitus with diabetic chronic kidney disease: Secondary | ICD-10-CM | POA: Diagnosis present

## 2022-12-04 DIAGNOSIS — I509 Heart failure, unspecified: Secondary | ICD-10-CM

## 2022-12-04 DIAGNOSIS — Z833 Family history of diabetes mellitus: Secondary | ICD-10-CM

## 2022-12-04 DIAGNOSIS — J9601 Acute respiratory failure with hypoxia: Secondary | ICD-10-CM | POA: Diagnosis present

## 2022-12-04 DIAGNOSIS — Z79899 Other long term (current) drug therapy: Secondary | ICD-10-CM

## 2022-12-04 DIAGNOSIS — E785 Hyperlipidemia, unspecified: Secondary | ICD-10-CM | POA: Diagnosis present

## 2022-12-04 DIAGNOSIS — Z823 Family history of stroke: Secondary | ICD-10-CM

## 2022-12-04 DIAGNOSIS — Z8249 Family history of ischemic heart disease and other diseases of the circulatory system: Secondary | ICD-10-CM

## 2022-12-04 DIAGNOSIS — F32A Depression, unspecified: Secondary | ICD-10-CM | POA: Diagnosis present

## 2022-12-04 DIAGNOSIS — M810 Age-related osteoporosis without current pathological fracture: Secondary | ICD-10-CM | POA: Diagnosis present

## 2022-12-04 DIAGNOSIS — K8020 Calculus of gallbladder without cholecystitis without obstruction: Secondary | ICD-10-CM | POA: Diagnosis present

## 2022-12-04 DIAGNOSIS — I160 Hypertensive urgency: Secondary | ICD-10-CM | POA: Diagnosis present

## 2022-12-04 DIAGNOSIS — I132 Hypertensive heart and chronic kidney disease with heart failure and with stage 5 chronic kidney disease, or end stage renal disease: Secondary | ICD-10-CM | POA: Diagnosis not present

## 2022-12-04 DIAGNOSIS — I071 Rheumatic tricuspid insufficiency: Secondary | ICD-10-CM | POA: Diagnosis present

## 2022-12-04 DIAGNOSIS — Z7982 Long term (current) use of aspirin: Secondary | ICD-10-CM

## 2022-12-04 DIAGNOSIS — Z992 Dependence on renal dialysis: Secondary | ICD-10-CM

## 2022-12-04 DIAGNOSIS — D696 Thrombocytopenia, unspecified: Secondary | ICD-10-CM | POA: Diagnosis present

## 2022-12-04 DIAGNOSIS — E119 Type 2 diabetes mellitus without complications: Secondary | ICD-10-CM

## 2022-12-04 DIAGNOSIS — Y841 Kidney dialysis as the cause of abnormal reaction of the patient, or of later complication, without mention of misadventure at the time of the procedure: Secondary | ICD-10-CM | POA: Diagnosis present

## 2022-12-04 DIAGNOSIS — Z87891 Personal history of nicotine dependence: Secondary | ICD-10-CM

## 2022-12-04 DIAGNOSIS — Z79890 Hormone replacement therapy (postmenopausal): Secondary | ICD-10-CM

## 2022-12-04 DIAGNOSIS — T82838A Hemorrhage of vascular prosthetic devices, implants and grafts, initial encounter: Secondary | ICD-10-CM | POA: Diagnosis present

## 2022-12-04 DIAGNOSIS — E039 Hypothyroidism, unspecified: Secondary | ICD-10-CM | POA: Diagnosis present

## 2022-12-04 DIAGNOSIS — Z841 Family history of disorders of kidney and ureter: Secondary | ICD-10-CM

## 2022-12-04 DIAGNOSIS — K219 Gastro-esophageal reflux disease without esophagitis: Secondary | ICD-10-CM | POA: Diagnosis present

## 2022-12-04 DIAGNOSIS — N186 End stage renal disease: Secondary | ICD-10-CM | POA: Diagnosis not present

## 2022-12-04 DIAGNOSIS — I5033 Acute on chronic diastolic (congestive) heart failure: Secondary | ICD-10-CM | POA: Diagnosis not present

## 2022-12-04 DIAGNOSIS — R188 Other ascites: Secondary | ICD-10-CM | POA: Diagnosis present

## 2022-12-04 DIAGNOSIS — Z800 Family history of malignant neoplasm of digestive organs: Secondary | ICD-10-CM

## 2022-12-04 DIAGNOSIS — J918 Pleural effusion in other conditions classified elsewhere: Secondary | ICD-10-CM | POA: Diagnosis present

## 2022-12-04 DIAGNOSIS — D631 Anemia in chronic kidney disease: Secondary | ICD-10-CM | POA: Diagnosis present

## 2022-12-04 DIAGNOSIS — Z885 Allergy status to narcotic agent status: Secondary | ICD-10-CM

## 2022-12-04 DIAGNOSIS — Z8673 Personal history of transient ischemic attack (TIA), and cerebral infarction without residual deficits: Secondary | ICD-10-CM

## 2022-12-04 DIAGNOSIS — Z794 Long term (current) use of insulin: Secondary | ICD-10-CM

## 2022-12-04 LAB — CBC WITH DIFFERENTIAL/PLATELET
Abs Immature Granulocytes: 0.01 10*3/uL (ref 0.00–0.07)
Basophils Absolute: 0 10*3/uL (ref 0.0–0.1)
Basophils Relative: 1 %
Eosinophils Absolute: 0.2 10*3/uL (ref 0.0–0.5)
Eosinophils Relative: 4 %
HCT: 29.7 % — ABNORMAL LOW (ref 36.0–46.0)
Hemoglobin: 9.6 g/dL — ABNORMAL LOW (ref 12.0–15.0)
Immature Granulocytes: 0 %
Lymphocytes Relative: 15 %
Lymphs Abs: 0.8 10*3/uL (ref 0.7–4.0)
MCH: 31.2 pg (ref 26.0–34.0)
MCHC: 32.3 g/dL (ref 30.0–36.0)
MCV: 96.4 fL (ref 80.0–100.0)
Monocytes Absolute: 0.7 10*3/uL (ref 0.1–1.0)
Monocytes Relative: 12 %
Neutro Abs: 3.8 10*3/uL (ref 1.7–7.7)
Neutrophils Relative %: 68 %
Platelets: 149 10*3/uL — ABNORMAL LOW (ref 150–400)
RBC: 3.08 MIL/uL — ABNORMAL LOW (ref 3.87–5.11)
RDW: 14.9 % (ref 11.5–15.5)
WBC: 5.5 10*3/uL (ref 4.0–10.5)
nRBC: 0 % (ref 0.0–0.2)

## 2022-12-04 LAB — PHOSPHORUS: Phosphorus: 4.8 mg/dL — ABNORMAL HIGH (ref 2.5–4.6)

## 2022-12-04 LAB — BASIC METABOLIC PANEL
Anion gap: 13 (ref 5–15)
BUN: 19 mg/dL (ref 8–23)
CO2: 30 mmol/L (ref 22–32)
Calcium: 8.7 mg/dL — ABNORMAL LOW (ref 8.9–10.3)
Chloride: 91 mmol/L — ABNORMAL LOW (ref 98–111)
Creatinine, Ser: 2.93 mg/dL — ABNORMAL HIGH (ref 0.44–1.00)
GFR, Estimated: 18 mL/min — ABNORMAL LOW (ref >60)
Glucose, Bld: 99 mg/dL (ref 70–99)
Potassium: 3.9 mmol/L (ref 3.5–5.1)
Sodium: 134 mmol/L — ABNORMAL LOW (ref 135–145)

## 2022-12-04 LAB — HEPATIC FUNCTION PANEL
ALT: 11 U/L (ref 0–44)
AST: 28 U/L (ref 15–41)
Albumin: 3.3 g/dL — ABNORMAL LOW (ref 3.5–5.0)
Alkaline Phosphatase: 82 U/L (ref 38–126)
Bilirubin, Direct: 0.2 mg/dL (ref 0.0–0.2)
Indirect Bilirubin: 0.6 mg/dL (ref 0.3–0.9)
Total Bilirubin: 0.8 mg/dL (ref 0.3–1.2)
Total Protein: 6.4 g/dL — ABNORMAL LOW (ref 6.5–8.1)

## 2022-12-04 LAB — BRAIN NATRIURETIC PEPTIDE: B Natriuretic Peptide: 3103.7 pg/mL — ABNORMAL HIGH (ref 0.0–100.0)

## 2022-12-04 MED ORDER — FLUOXETINE HCL 10 MG PO CAPS
10.0000 mg | ORAL_CAPSULE | Freq: Every day | ORAL | Status: DC
  Administered 2022-12-05 – 2022-12-07 (×3): 10 mg via ORAL
  Filled 2022-12-04 (×3): qty 1

## 2022-12-04 MED ORDER — ACETAMINOPHEN 500 MG PO TABS: 500.0000 mg | ORAL_TABLET | Freq: Four times a day (QID) | ORAL | Status: DC | PRN

## 2022-12-04 MED ORDER — LOPERAMIDE HCL 2 MG PO CAPS: 2.0000 mg | ORAL_CAPSULE | Freq: Every day | ORAL | Status: DC | PRN

## 2022-12-04 MED ORDER — LABETALOL HCL 5 MG/ML IV SOLN
10.0000 mg | Freq: Once | INTRAVENOUS | Status: AC
  Administered 2022-12-04: 10 mg via INTRAVENOUS
  Filled 2022-12-04: qty 4

## 2022-12-04 MED ORDER — CARVEDILOL 6.25 MG PO TABS
6.2500 mg | ORAL_TABLET | Freq: Two times a day (BID) | ORAL | Status: DC
  Administered 2022-12-04 – 2022-12-05 (×3): 6.25 mg via ORAL
  Filled 2022-12-04: qty 2
  Filled 2022-12-04 (×2): qty 1

## 2022-12-04 MED ORDER — IPRATROPIUM-ALBUTEROL 20-100 MCG/ACT IN AERS: 1.0000 | INHALATION_SPRAY | Freq: Four times a day (QID) | RESPIRATORY_TRACT | Status: DC | PRN

## 2022-12-04 MED ORDER — ORAL CARE MOUTH RINSE: 15.0000 mL | OROMUCOSAL | Status: DC | PRN

## 2022-12-04 MED ORDER — SODIUM CHLORIDE 0.9% FLUSH: 3.0000 mL | INTRAVENOUS | Status: DC | PRN

## 2022-12-04 MED ORDER — CHLORHEXIDINE GLUCONATE CLOTH 2 % EX PADS
6.0000 | MEDICATED_PAD | Freq: Every day | CUTANEOUS | Status: DC
  Administered 2022-12-05 – 2022-12-07 (×3): 6 via TOPICAL

## 2022-12-04 MED ORDER — RISAQUAD PO CAPS
2.0000 | ORAL_CAPSULE | Freq: Two times a day (BID) | ORAL | Status: DC
  Administered 2022-12-04 – 2022-12-07 (×6): 2 via ORAL
  Filled 2022-12-04 (×6): qty 2

## 2022-12-04 MED ORDER — HYDRALAZINE HCL 20 MG/ML IJ SOLN: 5.0000 mg | Freq: Four times a day (QID) | INTRAMUSCULAR | Status: DC | PRN

## 2022-12-04 MED ORDER — HEPARIN SODIUM (PORCINE) 5000 UNIT/ML IJ SOLN
5000.0000 [IU] | Freq: Two times a day (BID) | INTRAMUSCULAR | Status: DC
  Administered 2022-12-04 – 2022-12-07 (×6): 5000 [IU] via SUBCUTANEOUS
  Filled 2022-12-04 (×6): qty 1

## 2022-12-04 MED ORDER — LORATADINE 10 MG PO TABS
10.0000 mg | ORAL_TABLET | Freq: Every day | ORAL | Status: DC
  Administered 2022-12-05 – 2022-12-07 (×3): 10 mg via ORAL
  Filled 2022-12-04 (×3): qty 1

## 2022-12-04 MED ORDER — FUROSEMIDE 10 MG/ML IJ SOLN
40.0000 mg | Freq: Once | INTRAMUSCULAR | Status: AC
  Administered 2022-12-04: 40 mg via INTRAVENOUS
  Filled 2022-12-04: qty 4

## 2022-12-04 MED ORDER — SACCHAROMYCES BOULARDII 250 MG PO CAPS: 250.0000 mg | ORAL_CAPSULE | Freq: Two times a day (BID) | ORAL | Status: DC

## 2022-12-04 MED ORDER — AMLODIPINE BESYLATE 10 MG PO TABS
10.0000 mg | ORAL_TABLET | Freq: Every day | ORAL | Status: DC
  Administered 2022-12-04 – 2022-12-07 (×4): 10 mg via ORAL
  Filled 2022-12-04: qty 1
  Filled 2022-12-04: qty 2
  Filled 2022-12-04 (×2): qty 1

## 2022-12-04 MED ORDER — SODIUM CHLORIDE 0.9% FLUSH
3.0000 mL | Freq: Two times a day (BID) | INTRAVENOUS | Status: DC
  Administered 2022-12-04 – 2022-12-07 (×6): 3 mL via INTRAVENOUS

## 2022-12-04 MED ORDER — IOHEXOL 9 MG/ML PO SOLN: 500.0000 mL | ORAL | Status: AC

## 2022-12-04 MED ORDER — SENNOSIDES-DOCUSATE SODIUM 8.6-50 MG PO TABS: 1.0000 | ORAL_TABLET | Freq: Every evening | ORAL | Status: DC | PRN

## 2022-12-04 MED ORDER — HYDRALAZINE HCL 25 MG PO TABS: 25.0000 mg | ORAL_TABLET | Freq: Three times a day (TID) | ORAL | Status: DC

## 2022-12-04 MED ORDER — ACETAMINOPHEN 325 MG PO TABS: 650.0000 mg | ORAL_TABLET | ORAL | Status: DC | PRN

## 2022-12-04 MED ORDER — PANTOPRAZOLE SODIUM 40 MG PO TBEC
40.0000 mg | DELAYED_RELEASE_TABLET | Freq: Every day | ORAL | Status: DC
  Administered 2022-12-05 – 2022-12-07 (×3): 40 mg via ORAL
  Filled 2022-12-04 (×3): qty 1

## 2022-12-04 MED ORDER — IPRATROPIUM-ALBUTEROL 0.5-2.5 (3) MG/3ML IN SOLN: 3.0000 mL | Freq: Four times a day (QID) | RESPIRATORY_TRACT | Status: DC | PRN

## 2022-12-04 MED ORDER — MIRTAZAPINE 15 MG PO TABS
7.5000 mg | ORAL_TABLET | Freq: Every day | ORAL | Status: DC
  Administered 2022-12-04 – 2022-12-06 (×3): 7.5 mg via ORAL
  Filled 2022-12-04 (×3): qty 1

## 2022-12-04 MED ORDER — LEVOTHYROXINE SODIUM 88 MCG PO TABS
88.0000 ug | ORAL_TABLET | Freq: Every day | ORAL | Status: DC
  Administered 2022-12-05 – 2022-12-07 (×3): 88 ug via ORAL
  Filled 2022-12-04 (×3): qty 1

## 2022-12-04 MED ORDER — ONDANSETRON HCL 4 MG PO TABS: 4.0000 mg | ORAL_TABLET | Freq: Three times a day (TID) | ORAL | Status: DC | PRN

## 2022-12-04 MED ORDER — SEVELAMER CARBONATE 800 MG PO TABS
800.0000 mg | ORAL_TABLET | Freq: Three times a day (TID) | ORAL | Status: DC
  Administered 2022-12-05 – 2022-12-07 (×8): 800 mg via ORAL
  Filled 2022-12-04 (×8): qty 1

## 2022-12-04 MED ORDER — HYDROMORPHONE HCL 1 MG/ML IJ SOLN
0.5000 mg | INTRAMUSCULAR | Status: DC | PRN
  Administered 2022-12-04 – 2022-12-07 (×4): 0.5 mg via INTRAVENOUS
  Filled 2022-12-04 (×4): qty 0.5

## 2022-12-04 MED ORDER — SODIUM CHLORIDE 0.9 % IV SOLN: 250.0000 mL | INTRAVENOUS | Status: DC | PRN

## 2022-12-04 MED ORDER — HYDRALAZINE HCL 25 MG PO TABS: 25.0000 mg | ORAL_TABLET | Freq: Four times a day (QID) | ORAL | Status: DC | PRN

## 2022-12-04 MED ORDER — NOVASOURCE RENAL PO LIQD: 237.0000 mL | Freq: Three times a day (TID) | ORAL | Status: DC

## 2022-12-04 MED ORDER — MIDODRINE HCL 5 MG PO TABS: 10.0000 mg | ORAL_TABLET | ORAL | Status: DC

## 2022-12-04 MED ORDER — ISOSORBIDE MONONITRATE ER 30 MG PO TB24: 30.0000 mg | ORAL_TABLET | Freq: Every day | ORAL | Status: DC

## 2022-12-04 MED ORDER — ASPIRIN 81 MG PO CHEW
81.0000 mg | CHEWABLE_TABLET | Freq: Every day | ORAL | Status: DC
  Administered 2022-12-05 – 2022-12-07 (×3): 81 mg via ORAL
  Filled 2022-12-04 (×3): qty 1

## 2022-12-04 MED ORDER — ATORVASTATIN CALCIUM 40 MG PO TABS
40.0000 mg | ORAL_TABLET | Freq: Every day | ORAL | Status: DC
  Administered 2022-12-04 – 2022-12-06 (×3): 40 mg via ORAL
  Filled 2022-12-04 (×3): qty 1

## 2022-12-04 NOTE — ED Provider Notes (Signed)
[place] EMERGENCY DEPARTMENT AT [facility] Provider Note   CSN: [id number] Arrival date & time: 12/04/22  1411     History  Chief Complaint  Patient presents with   Weight Gain     [name] Michael is a 62 y.o. female.  Pt is a 62 yo female with pmhx significant for htn, tia, hld, cva, gerd, esrd on hd (Tu, Th, Sat), DM2, CHF, hypothyroidism, depression, chronic thrombocytopenia, syncope, and tricuspid regurg.  Pt presents to the ED today with fluid overload.  Per EMS, pt has gained 9 kg in weight since her last dialysis session 2 days ago.  On Tuesday, they took off 3L.  She did go to dialysis today and they did about 1 hour of her session.  They told EMS they did not do the whole session because she needed to come to the hospital.  Pt is sob.  She is saturating 91% on RA, so she is put on 2L.  She does not usually wear oxygen.       Home Medications Prior to Admission medications   Medication Sig Start Date End Date Taking? Authorizing Provider  acetaminophen (TYLENOL) 500 MG tablet Take 500 mg by mouth every 6 (six) hours as needed for mild pain.    [provider]  aspirin 81 MG chewable tablet Chew 81 mg by mouth daily.    [provider]  atorvastatin (LIPITOR) 40 MG tablet Take 40 mg by mouth at bedtime.     [provider]  b complex-vitamin c-folic acid (NEPHRO-VITE) 0.8 MG TABS tablet Take 1 tablet by mouth at bedtime.    [provider]  carvedilol (COREG) 6.25 MG tablet Take 1 tablet (6.25 mg total) by mouth 2 (two) times daily with a meal. 02/22/21   Leroy Sea, MD  cetirizine (ZYRTEC) 10 MG tablet Take 10 mg by mouth daily as needed for allergies.    [provider]  chlorhexidine (PERIDEX) 0.12 % solution Use as directed 15 mLs in the mouth or throat 2 (two) times daily. 11/29/22   Palumbo, April, MD  cloNIDine (CATAPRES) 0.1 MG tablet Take 0.1 mg by mouth every 8 (eight) hours as needed (BP>160/90).     [provider]  COMBIVENT RESPIMAT 20-100 MCG/ACT AERS respimat Inhale 1 puff into the lungs every 6 (six) hours as needed for wheezing or shortness of breath. 07/14/22   [provider]  esomeprazole (NEXIUM) 40 MG capsule Take 40 mg by mouth daily.    [provider]  FLUoxetine (PROZAC) 10 MG capsule Take 10 mg by mouth daily. 12/21/20   [provider]  Lido-PE-Glycerin-Petrolatum (PREPARATION H RAPID RELIEF EX) Apply topically every 6 (six) hours as needed (hemorrhoid).    [provider]  loperamide (IMODIUM) 2 MG capsule Take 2 mg by mouth daily as needed for diarrhea or loose stools.    [provider]  midodrine (PROAMATINE) 10 MG tablet Take 10 mg by mouth See admin instructions. Take 1 tablet (10 mg) by mouth prior to dialysis on Tuesday, Thursday, Saturday    [provider]  mirtazapine (REMERON) 7.5 MG tablet Take 7.5 mg by mouth at bedtime. 06/27/22   [provider]  Nutritional Supplements (NOVASOURCE RENAL) LIQD Take 237 mLs by mouth with breakfast, with lunch, and with evening meal.    [provider]  omega-3 acid ethyl esters (LOVAZA) 1 g capsule Take 2 capsules by mouth 2 (two) times daily. 05/28/20   [provider]  ondansetron (ZOFRAN) 4 MG tablet Take 4 mg by mouth every 8 (eight) hours as needed for nausea or vomiting.    [provider]  SACCHAROMYCES BOULARDII PO Take 1 capsule by mouth 2 (two) times daily.    [provider]  senna-docusate (SENOKOT-S) 8.6-50 MG tablet Take 1 tablet by mouth at bedtime as needed for mild constipation. 06/11/18   Regalado, [name], MD  sevelamer carbonate (RENVELA) 800 MG tablet Take 800 mg by mouth 3 (three) times daily with meals.    [provider]  Skin Protectants, Misc. (EUCERIN) cream Apply 1 Application topically 2 (two) times daily.    [provider]  SYNTHROID 88 MCG tablet Take 88 mcg by mouth daily before  breakfast. 07/12/22   [provider]      Allergies    Hydrocodone    Review of Systems   Review of Systems  Respiratory:  Positive for shortness of breath.   Gastrointestinal:  Positive for abdominal distention.  All other systems reviewed and are negative.   Physical Exam Updated Vital Signs BP (!) 195/124   Pulse 73   Temp 98.1 F (36.7 C) (Oral)   Resp 19   LMP 03/06/2013 (LMP Unknown)   SpO2 94%  Physical Exam Vitals and nursing note reviewed.  Constitutional:      General: She is in acute distress.     Appearance: Normal appearance.  HENT:     Head: Normocephalic and atraumatic.     Right Ear: External ear normal.     Left Ear: External ear normal.     Nose: Nose normal.     Mouth/Throat:     Mouth: Mucous membranes are moist.     Pharynx: Oropharynx is clear.  Eyes:     Extraocular Movements: Extraocular movements intact.     Conjunctiva/sclera: Conjunctivae normal.     Pupils: Pupils are equal, round, and reactive to light.  Cardiovascular:     Rate and Rhythm: Normal rate and regular rhythm.     Pulses: Normal pulses.     Heart sounds: Normal heart sounds.  Pulmonary:     Effort: Tachypnea present.  Abdominal:     General: There is distension.     Palpations: There is fluid wave.  Musculoskeletal:        General: Normal range of motion.     Cervical back: Normal range of motion and neck supple.  Skin:    General: Skin is warm.     Capillary Refill: Capillary refill takes less than 2 seconds.  Neurological:     General: No focal deficit present.     Mental Status: She is alert and oriented to person, place, and time.  Psychiatric:        Mood and Affect: Mood normal.        Behavior: Behavior normal.     ED Results / Procedures / Treatments   Labs (all labs ordered are listed, but only abnormal results are displayed) Labs Reviewed  BASIC METABOLIC PANEL - Abnormal; Notable for the following components:      Result Value   Sodium  134 (*)    Chloride 91 (*)    Creatinine, Ser 2.93 (*)    Calcium 8.7 (*)    GFR, Estimated 18 (*)    All other components within normal limits  CBC WITH DIFFERENTIAL/PLATELET - Abnormal; Notable for the following components:   RBC 3.08 (*)    Hemoglobin 9.6 (*)  HCT 29.7 (*)    Platelets 149 (*)    All other components within normal limits  BRAIN NATRIURETIC PEPTIDE - Abnormal; Notable for the following components:   B Natriuretic Peptide 3,103.7 (*)    All other components within normal limits  HEPATIC FUNCTION PANEL - Abnormal; Notable for the following components:   Total Protein 6.4 (*)    Albumin 3.3 (*)    All other components within normal limits    EKG EKG Interpretation  Date/Time:  Thursday December 04 2022 14:28:02 EDT Ventricular Rate:  75 PR Interval:  172 QRS Duration: 167 QT Interval:  498 QTC Calculation: 557 R Axis:   131 Text Interpretation: Sinus rhythm RBBB and LPFB No significant change since Confirmed by Jacalyn Lefevre [phone]) on 12/04/2022 2:49:17 PM  Radiology DG Chest Port 1 View  Result Date: 12/04/2022 CLINICAL DATA:  Shortness of breath EXAM: PORTABLE CHEST 1 VIEW COMPARISON:  Chest x-ray dated August 03, 2022 FINDINGS: Unchanged cardiomegaly. Lungs are clear. Probable small right pleural effusion, overlying leads somewhat limits evaluation. No evidence of pneumothorax. IMPRESSION: Probable small right pleural effusion. Electronically Signed   By: Allegra Lai M.D.   On: 12/04/2022 15:11    Procedures Procedures    Medications Ordered in ED Medications  labetalol (NORMODYNE) injection 10 mg (10 mg Intravenous Given 12/04/22 1623)  furosemide (LASIX) injection 40 mg (40 mg Intravenous Given 12/04/22 1623)    ED Course/ Medical Decision Making/ A&P                             Medical Decision Making Amount and/or Complexity of Data Reviewed Labs: ordered. Radiology: ordered.  Risk Prescription drug management. Decision regarding  hospitalization.   This patient presents to the ED for concern of sob and weight gain, this involves an extensive number of treatment options, and is a complaint that carries with it a high risk of complications and morbidity.  The differential diagnosis includes chf, electrolyte abn   Co morbidities that complicate the patient evaluation  htn, tia, hld, cva, gerd, esrd on hd (Tu, Th, Sat), DM2, syncope, and tricuspid regurg   Additional history obtained:  Additional history obtained from epic chart review External records from outside source obtained and reviewed including EMS report   Lab Tests:  I Ordered, and personally interpreted labs.  The pertinent results include:  cbc with hgb 9.6 (stable), bmp with cr 2.93, bnp 3103, lfts nl   Imaging Studies ordered:  I ordered imaging studies including cxr  I independently visualized and interpreted imaging which showed Probable small right pleural effusion.  I agree with the radiologist interpretation   Cardiac Monitoring:  The patient was maintained on a cardiac monitor.  I personally viewed and interpreted the cardiac monitored which showed an underlying rhythm of: nsr   Medicines ordered and prescription drug management:  I ordered medication including lasix (pt said she still urinates) and labetolol  for fluid overload and htn Reevaluation of the patient after these medicines showed that the patient improved I have reviewed the patients home medicines and have made adjustments as needed   Test Considered:  cxr   Critical Interventions:  oxygen   Consultations Obtained:  I requested consultation with the nephrologist (Dr. Arlean Hopping),  and discussed lab and imaging findings as well as pertinent plan - he will see pt in consult Pt d/w Dr. Chipper Herb ([facility]) who will admit   Problem List / ED Course:  ESRD on HD:  fluid overloaded on exam with hypoxia requiring oxygen.  Pt will need additional dialysis sessions. Htn:   pt said she took her bp meds this am.  Pt given labetalol in ED.  She still urinates, so is given lasix as well.   Reevaluation:  After the interventions noted above, I reevaluated the patient and found that they have :improved   Social Determinants of Health:  Lives at home   Dispostion:  After consideration of the diagnostic results and the patients response to treatment, I feel that the patent would benefit from admission.    CRITICAL CARE Performed by: Jacalyn Lefevre   Total critical care time: 30 minutes  Critical care time was exclusive of separately billable procedures and treating other patients.  Critical care was necessary to treat or prevent imminent or life-threatening deterioration.  Critical care was time spent personally by me on the following activities: development of treatment plan with patient and/or surrogate as well as nursing, discussions with consultants, evaluation of patient's response to treatment, examination of patient, obtaining history from patient or surrogate, ordering and performing treatments and interventions, ordering and review of laboratory studies, ordering and review of radiographic studies, pulse oximetry and re-evaluation of patient's condition.        Final Clinical Impression(s) / ED Diagnoses Final diagnoses:  ESRD on hemodialysis (HCC)  Hypertensive urgency  Acute respiratory failure with hypoxia [facility])    Rx / DC Orders ED Discharge Orders     None         Jacalyn Lefevre, MD 12/04/22 1640

## 2022-12-04 NOTE — Consult Note (Signed)
Renal Service Consult Note [facility]  [name] Michael 12/04/2022 [name] Krabbe, MD Requesting Physician: [name] Michael  Reason for Consult: ESRD pt w/ volume overload, SOB HPI: The patient is a 62 y.o. year-old w/ PMH as below who presented to ED sent from her outpt HD unit for volume overload and SOB. She got about 1 hr HD today. Has not missed HD. In ED SpO2 91% on RA, put on 2L. CXR showed small R effusion, no edema though. Pt to be admitted. We are asked to see for esrd.   Pt seen in ED room. Her main c/o is her abdominal distension. She does have hx of paracentesis back in aug 2022. Not recently it appears. Pt denies missing any HD. She is a very vague/ poor historian. Hasn't missed HD. Her presenting wt's have been progressively worse over the last 2 weeks. 1 week ago she was coming off 4 kg over, then on Tuesday she came off 6kg over, and today she presented 9 kg over. Her abdomen is markedly distended. She is SOB but not in distress. Her bp's are high.    ROS - denies CP, no joint pain, no HA, no blurry vision, no rash, no diarrhea, no nausea/ vomiting, no dysuria, no difficulty voiding   Past Medical History  Past Medical History:  Diagnosis Date   Ambulates with cane    Constipation    Depression    ESRD (end stage renal disease) (HCC)    a. TTS Dialysis.   GERD (gastroesophageal reflux disease)    History of stress test    a. 01/2003 MV: EF 74%, no ischemia/infarct.   Hyperlipidemia    Hypertension    Osteoporosis    Stroke [facility]) 04-01-11   left frontal subcortical, saw Dr. Pearlean Brownie    Syncope 11/2019   TIA (transient ischemic attack) 03-12-11   Tobacco abuse    Tricuspid regurgitation    a. 05/2016 Echo: EF 65-70%, Gr2DD, mild MR, nl RV fxn, Triv TR, PASP ; b. 05/2018 Echo: EF 60-65%, Gr2DD, mild MR/TR, RVSP/PASP ; c. 11/2019 Echo: EF 55-60%, no rwma, mild-mod MR, Sev TR w/ RV dilatation (CTA chest neg for PE).   Type II diabetes mellitus (HCC)     Vitamin D deficiency    Past Surgical History  Past Surgical History:  Procedure Laterality Date   AV FISTULA PLACEMENT Left 02/21/2019   Procedure: BRACHIOCEPHALIC ARTERIOVENOUS (AV) FISTULA CREATION;  Surgeon: Cephus Shelling, MD;  Location: [facility] OR;  Service: Vascular;  Laterality: Left;   BASCILIC VEIN TRANSPOSITION Left 04/11/2019   Procedure: SECOND STAGE BASILIC VEIN TRANSPOSITION LEFT ARM;  Surgeon: Cephus Shelling, MD;  Location: [facility] OR;  Service: Vascular;  Laterality: Left;   IR FLUORO GUIDE CV LINE RIGHT  04/29/2019   IR PARACENTESIS  02/20/2021   IR US GUIDE VASC ACCESS RIGHT  04/29/2019   OPEN REDUCTION INTERNAL FIXATION (ORIF) DISTAL RADIAL FRACTURE Left 01/28/2016   Procedure: OPEN REDUCTION INTERNAL FIXATION (ORIF) DISTAL RADIAL FRACTURE;  Surgeon: Bradly Bienenstock, MD;  Location: [facility] OR;  Service: Orthopedics;  Laterality: Left;   REFRACTIVE SURGERY     [facility]   RIGHT HEART CATH N/A 12/05/2019   Procedure: RIGHT HEART CATH;  Surgeon: Yvonne Kendall, MD;  Location: [facility] INVASIVE CV LAB;  Service: Cardiovascular;  Laterality: N/A;   TONSILLECTOMY AND ADENOIDECTOMY     age 62   Family History  Family History  Problem Relation Age of Onset   Stroke Mother  Diabetes Mother    Kidney failure Mother    Heart failure Mother    Stroke Father    Cancer Sister        Breast- 70's   Colon cancer Maternal Grandmother    Pancreatic cancer Neg Hx    Esophageal cancer Neg Hx    Liver disease Neg Hx    Stomach cancer Neg Hx    Social History  reports that she has been smoking cigarettes. She has a 9.60 pack-year smoking history. She has been exposed to tobacco smoke. She has never used smokeless tobacco. She reports that she does not drink alcohol and does not use drugs. Allergies  Allergies  Allergen Reactions   Hydrocodone Nausea And Vomiting and Other (See Comments)    "Allergic," per MAR   Hydrocodone-Acetaminophen     Other Reaction(s): GI Intolerance    Home medications Prior to Admission medications   Medication Sig Start Date End Date Taking? Authorizing Provider  acetaminophen (TYLENOL) 500 MG tablet Take 500 mg by mouth every 6 (six) hours as needed for mild pain.    [provider]  aspirin 81 MG chewable tablet Chew 81 mg by mouth daily.    [provider]  atorvastatin (LIPITOR) 40 MG tablet Take 40 mg by mouth at bedtime.     [provider]  b complex-vitamin c-folic acid (NEPHRO-VITE) 0.8 MG TABS tablet Take 1 tablet by mouth at bedtime.    [provider]  carvedilol (COREG) 6.25 MG tablet Take 1 tablet (6.25 mg total) by mouth 2 (two) times daily with a meal. 02/22/21   Leroy Sea, MD  cetirizine (ZYRTEC) 10 MG tablet Take 10 mg by mouth daily as needed for allergies.    [provider]  chlorhexidine (PERIDEX) 0.12 % solution Use as directed 15 mLs in the mouth or throat 2 (two) times daily. 11/29/22   Palumbo, April, MD  cloNIDine (CATAPRES) 0.1 MG tablet Take 0.1 mg by mouth every 8 (eight) hours as needed (BP>160/90).    [provider]  COMBIVENT RESPIMAT 20-100 MCG/ACT AERS respimat Inhale 1 puff into the lungs every 6 (six) hours as needed for wheezing or shortness of breath. 07/14/22   [provider]  esomeprazole (NEXIUM) 40 MG capsule Take 40 mg by mouth daily.    [provider]  FLUoxetine (PROZAC) 10 MG capsule Take 10 mg by mouth daily. 12/21/20   [provider]  Lido-PE-Glycerin-Petrolatum (PREPARATION H RAPID RELIEF EX) Apply topically every 6 (six) hours as needed (hemorrhoid).    [provider]  loperamide (IMODIUM) 2 MG capsule Take 2 mg by mouth daily as needed for diarrhea or loose stools.    [provider]  midodrine (PROAMATINE) 10 MG tablet Take 10 mg by mouth See admin instructions. Take 1 tablet (10 mg) by mouth prior to dialysis on Tuesday, Thursday, Saturday    [provider]  mirtazapine  (REMERON) 7.5 MG tablet Take 7.5 mg by mouth at bedtime. 06/27/22   [provider]  Nutritional Supplements (NOVASOURCE RENAL) LIQD Take 237 mLs by mouth with breakfast, with lunch, and with evening meal.    [provider]  omega-3 acid ethyl esters (LOVAZA) 1 g capsule Take 2 capsules by mouth 2 (two) times daily. 05/28/20   [provider]  ondansetron (ZOFRAN) 4 MG tablet Take 4 mg by mouth every 8 (eight) hours as needed for nausea or vomiting.    [provider]  SACCHAROMYCES BOULARDII  PO Take 1 capsule by mouth 2 (two) times daily.    [provider]  senna-docusate (SENOKOT-S) 8.6-50 MG tablet Take 1 tablet by mouth at bedtime as needed for mild constipation. 06/11/18   Regalado, [name], MD  sevelamer carbonate (RENVELA) 800 MG tablet Take 800 mg by mouth 3 (three) times daily with meals.    [provider]  Skin Protectants, Misc. (EUCERIN) cream Apply 1 Application topically 2 (two) times daily.    [provider]  SYNTHROID 88 MCG tablet Take 88 mcg by mouth daily before breakfast. 07/12/22   [provider]     Vitals:   12/04/22 1645 12/04/22 1700 12/04/22 1715 12/04/22 1730  BP: (!) 195/114 (!) 186/62 (!) 173/137 (!) 178/82  Pulse: 64 66 67 65  Resp: 20 18 (!) 21 16  Temp:      TempSrc:      SpO2: 90% 91% 92% 96%   Exam Gen alert, no distress,  O2 No rash, cyanosis or gangrene Sclera anicteric, throat clear  No jvd or bruits Chest clear bilat to bases, no rales/ wheezing RRR no MRG Abd marked ascites 3+ , nontender, +BS GU defer MS no joint effusions or deformity Ext no LE or UE edema, no wounds or ulcers Neuro is alert, nf    LUA AVF+bruit       Home meds include - pending      OP HD: [facility]  3h  350/500   33kg  2/2 bath  Heparin none  AVF - last OP HD was Tuesday, came off 6kg up - presented to HD today 9kg up, unable to pull more that 2 L it looks for the last 2  wks    Assessment/ Plan: Volume overload - w/ normal CXR and marked ascites. Has had paracentesis here in 2022 x 1 it appears. She has been presenting w/ higher and higher wt's at OP HD, and we are getting only 2 L off w/ each session. Could be the all the extra fluid is in the abdomen, which is typically not accessible by dialysis. Have d/w pmd, they will get abd [name]. We will plan on HD tonight w/ max UF 3 L if tolerates.  ESRD - on HD TTS. Has not missed HD. HD tonight.  HTN - vol overload prob contributing. Cont any home HTN meds.  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  MBD ckd - CCa in range, add on phos. Get records.  Ascites - not sure cause, can be seen in some ESRD pts w/o invoking cirrhosis.  H/o CVA      Vinson Moselle  MD [facility] 12/04/2022, 5:37 PM  Recent Labs  Lab 12/04/22 1425  HGB 9.6*  ALBUMIN 3.3*  CALCIUM 8.7*  CREATININE 2.93*  K 3.9   Inpatient medications:

## 2022-12-04 NOTE — ED Notes (Signed)
ED TO INPATIENT HANDOFF REPORT  ED Nurse Name and Phone #:   S Name/Age/Gender [name] Michael 62 y.o. female Room/Bed: 012C/012C  Code Status   Code Status: DNR  Home/SNF/Other Home Patient oriented to: self, place, time, and situation Is this baseline? Yes   Triage Complete: Triage complete  Chief Complaint CHF (congestive heart failure) (HCC) [I50.9]  Triage Note Patient BIB EMS from Dialysis, 150 of fluid taken off today, since Tuesday gain 9kg of weight. Tuesday 3 liters off. Place on 3liters via [place]. ABD distended.  10/10 pain ABD.196/84   Allergies Allergies  Allergen Reactions   Hydrocodone Nausea And Vomiting and Other (See Comments)    "Allergic," per MAR   Hydrocodone-Acetaminophen     Other Reaction(s): GI Intolerance    Level of Care/Admitting Diagnosis ED Disposition     ED Disposition  Admit   Condition  --   Comment  Hospital Area: [facility] [[id number]]  Level of Care: Telemetry Medical [104]  May place patient in observation at [facility] or [facility] if equivalent level of care is available:: No  Covid Evaluation: Asymptomatic - no recent exposure (last 10 days) testing not required  Diagnosis: CHF (congestive heart failure) [facility]) [[id number]]  Admitting Physician: Emeline General [[id number]]  Attending Physician: Emeline General [[id number]]          B Medical/Surgery History Past Medical History:  Diagnosis Date   Ambulates with cane    Constipation    Depression    ESRD (end stage renal disease) (HCC)    a. TTS Dialysis.   GERD (gastroesophageal reflux disease)    History of stress test    a. 01/2003 MV: EF 74%, no ischemia/infarct.   Hyperlipidemia    Hypertension    Osteoporosis    Stroke [facility]) 04-01-11   left frontal subcortical, saw Dr. Pearlean Brownie    Syncope 11/2019   TIA (transient ischemic attack) 03-12-11   Tobacco abuse    Tricuspid regurgitation    a. 05/2016 Echo: EF 65-70%, Gr2DD, mild MR, nl RV fxn, Triv TR, PASP  ; b. 05/2018 Echo: EF 60-65%, Gr2DD, mild MR/TR, RVSP/PASP ; c. 11/2019 Echo: EF 55-60%, no rwma, mild-mod MR, Sev TR w/ RV dilatation (CTA chest neg for PE).   Type II diabetes mellitus (HCC)    Vitamin D deficiency    Past Surgical History:  Procedure Laterality Date   AV FISTULA PLACEMENT Left 02/21/2019   Procedure: BRACHIOCEPHALIC ARTERIOVENOUS (AV) FISTULA CREATION;  Surgeon: Cephus Shelling, MD;  Location: [facility] OR;  Service: Vascular;  Laterality: Left;   BASCILIC VEIN TRANSPOSITION Left 04/11/2019   Procedure: SECOND STAGE BASILIC VEIN TRANSPOSITION LEFT ARM;  Surgeon: Cephus Shelling, MD;  Location: [facility] OR;  Service: Vascular;  Laterality: Left;   IR FLUORO GUIDE CV LINE RIGHT  04/29/2019   IR PARACENTESIS  02/20/2021   IR US GUIDE VASC ACCESS RIGHT  04/29/2019   OPEN REDUCTION INTERNAL FIXATION (ORIF) DISTAL RADIAL FRACTURE Left 01/28/2016   Procedure: OPEN REDUCTION INTERNAL FIXATION (ORIF) DISTAL RADIAL FRACTURE;  Surgeon: Bradly Bienenstock, MD;  Location: [facility] OR;  Service: Orthopedics;  Laterality: Left;   REFRACTIVE SURGERY     [facility]   RIGHT HEART CATH N/A 12/05/2019   Procedure: RIGHT HEART CATH;  Surgeon: Yvonne Kendall, MD;  Location: [facility] INVASIVE CV LAB;  Service: Cardiovascular;  Laterality: N/A;   TONSILLECTOMY AND ADENOIDECTOMY     age 74     A IV Location/Drains/Wounds  Patient Lines/Drains/Airways Status     Active Line/Drains/Airways     Name Placement date Placement time Site Days   Peripheral IV 12/04/22 18 G Anterior;Right Forearm 12/04/22  1513  Forearm  less than 1            Intake/Output Last 24 hours No intake or output data in the 24 hours ending 12/04/22 1748  Labs/Imaging Results for orders placed or performed during the hospital encounter of 12/04/22 (from the past 48 hour(s))  Basic metabolic panel     Status: Abnormal   Collection Time: 12/04/22  2:25 PM  Result Value Ref Range   Sodium 134 (L) 135 - 145 mmol/L    Potassium 3.9 3.5 - 5.1 mmol/L   Chloride 91 (L) 98 - 111 mmol/L   CO2 30 22 - 32 mmol/L   Glucose, Bld 99 70 - 99 mg/dL    Comment: Glucose reference range applies only to samples taken after fasting for at least 8 hours.   BUN 19 8 - 23 mg/dL   Creatinine, Ser 1.61 (H) 0.44 - 1.00 mg/dL   Calcium 8.7 (L) 8.9 - 10.3 mg/dL   GFR, Estimated 18 (L) >60 mL/min    Comment: (NOTE) Calculated using the CKD-EPI Creatinine Equation (2021)    Anion gap 13 5 - 15    Comment: Performed at [facility], [address] [place], [place] [address]  CBC with Differential     Status: Abnormal   Collection Time: 12/04/22  2:25 PM  Result Value Ref Range   WBC 5.5 4.0 - 10.5 K/uL   RBC 3.08 (L) 3.87 - 5.11 MIL/uL   Hemoglobin 9.6 (L) 12.0 - 15.0 g/dL   HCT 54.0 (L) 98.1 - 19.1 %   MCV 96.4 80.0 - 100.0 fL   MCH 31.2 26.0 - 34.0 pg   MCHC 32.3 30.0 - 36.0 g/dL   RDW 47.8 29.5 - 62.1 %   Platelets 149 (L) 150 - 400 K/uL   nRBC 0.0 0.0 - 0.2 %   Neutrophils Relative % 68 %   Neutro Abs 3.8 1.7 - 7.7 K/uL   Lymphocytes Relative 15 %   Lymphs Abs 0.8 0.7 - 4.0 K/uL   Monocytes Relative 12 %   Monocytes Absolute 0.7 0.1 - 1.0 K/uL   Eosinophils Relative 4 %   Eosinophils Absolute 0.2 0.0 - 0.5 K/uL   Basophils Relative 1 %   Basophils Absolute 0.0 0.0 - 0.1 K/uL   Immature Granulocytes 0 %   Abs Immature Granulocytes 0.01 0.00 - 0.07 K/uL    Comment: Performed at [facility], [address], [place], [place] [address]  Brain natriuretic peptide     Status: Abnormal   Collection Time: 12/04/22  2:25 PM  Result Value Ref Range   B Natriuretic Peptide 3,103.7 (H) 0.0 - 100.0 pg/mL    Comment: Performed at [facility], [address], [place], [place] [address]  Hepatic function panel     Status: Abnormal   Collection Time: 12/04/22  2:25 PM  Result Value Ref Range   Total Protein 6.4 (L) 6.5 - 8.1 g/dL   Albumin 3.3 (L) 3.5 - 5.0 g/dL   AST 28 15 - 41 U/L   ALT 11 0 - 44  U/L   Alkaline Phosphatase 82 38 - 126 U/L   Total Bilirubin 0.8 0.3 - 1.2 mg/dL   Bilirubin, Direct 0.2 0.0 - 0.2 mg/dL   Indirect Bilirubin  0.6 0.3 - 0.9 mg/dL    Comment: Performed at [facility], [address], [place], [place] [address]   DG Chest Port 1 View  Result Date: 12/04/2022 CLINICAL DATA:  Shortness of breath EXAM: PORTABLE CHEST 1 VIEW COMPARISON:  Chest x-ray dated August 03, 2022 FINDINGS: Unchanged cardiomegaly. Lungs are clear. Probable small right pleural effusion, overlying leads somewhat limits evaluation. No evidence of pneumothorax. IMPRESSION: Probable small right pleural effusion. Electronically Signed   By: Allegra Lai M.D.   On: 12/04/2022 15:11    Pending Labs Unresulted Labs (From admission, onward)     Start     Ordered   12/05/22 0500  Basic metabolic panel  Daily,   R     Comments: As Scheduled for 5 days    12/04/22 1744            Vitals/Pain Today's Vitals   12/04/22 1645 12/04/22 1700 12/04/22 1715 12/04/22 1730  BP: (!) 195/114 (!) 186/62 (!) 173/137 (!) 178/82  Pulse: 64 66 67 65  Resp: 20 18 (!) 21 16  Temp:      TempSrc:      SpO2: 90% 91% 92% 96%  PainSc:        Isolation Precautions No active isolations  Medications Medications  iohexol (OMNIPAQUE) 9 MG/ML oral solution 500 mL (has no administration in time range)  aspirin chewable tablet 81 mg (has no administration in time range)  atorvastatin (LIPITOR) tablet 40 mg (has no administration in time range)  carvedilol (COREG) tablet 6.25 mg (has no administration in time range)  midodrine (PROAMATINE) tablet 10 mg (has no administration in time range)  FLUoxetine (PROZAC) capsule 10 mg (has no administration in time range)  mirtazapine (REMERON) tablet 7.5 mg (has no administration in time range)  levothyroxine (SYNTHROID) tablet 88 mcg (has no administration in time range)  pantoprazole (PROTONIX) EC tablet 40 mg (has no administration in time range)   loperamide (IMODIUM) capsule 2 mg (has no administration in time range)  ondansetron (ZOFRAN) tablet 4 mg (has no administration in time range)  saccharomyces boulardii (FLORASTOR) capsule 250 mg (has no administration in time range)  senna-docusate (Senokot-S) tablet 1 tablet (has no administration in time range)  sevelamer carbonate (RENVELA) tablet 800 mg (has no administration in time range)  [employer] Renal LIQD 237 mL (has no administration in time range)  Ipratropium-Albuterol (COMBIVENT) respimat 1 puff (has no administration in time range)  loratadine (CLARITIN) tablet 10 mg (has no administration in time range)  sodium chloride flush (NS) 0.9 % injection 3 mL (has no administration in time range)  sodium chloride flush (NS) 0.9 % injection 3 mL (has no administration in time range)  0.9 %  sodium chloride infusion (has no administration in time range)  acetaminophen (TYLENOL) tablet 650 mg (has no administration in time range)  heparin injection 5,000 Units (has no administration in time range)  amLODipine (NORVASC) tablet 10 mg (has no administration in time range)  hydrALAZINE (APRESOLINE) injection 5 mg (has no administration in time range)  labetalol (NORMODYNE) injection 10 mg (10 mg Intravenous Given 12/04/22 1623)  furosemide (LASIX) injection 40 mg (40 mg Intravenous Given 12/04/22 1623)    Mobility non-ambulatory     Focused Assessments Renal Assessment Handoff:  Hemodialysis Schedule: Hemodialysis Schedule: Tuesday/Thursday/Saturday Last Hemodialysis date and time: 12/04/2022   Restricted appendage: left arm   R Recommendations: See Admitting Provider Note  Report given to:   Additional Notes:

## 2022-12-04 NOTE — ED Notes (Signed)
Patient refuses to drink contrast at this time. Patient drink small amount . MD made aware.

## 2022-12-04 NOTE — H&P (Addendum)
History and Physical    [name] Michael ZOX:[id number] DOB: November 27, 1960 DOA: 12/04/2022  PCP: Renford Dills, MD (Confirm with patient/family/NH records and if not entered, this has to be entered at [facility] point of entry) Patient coming from: Home  I have personally briefly reviewed patient's old medical records in [facility]  Chief Complaint: Belly hurts, swell up in belly  HPI: [name] Michael is a 62 y.o. female with medical history significant of ESRD on HD TTS, chronic HFpEF, hypothyroidism, IDDM on diet control, anemia secondary to CKD, GERD, HLD, presented with fluid overload, acute abdominal pain.  Patient underwent a normal HD session on Tuesday, with about about 3 L removed.  Yesterday patient started to have abdominal pain, sharp-like, poorly located, associated nausea no vomiting no diarrhea no fever or chills.  Today, patient went to her regular HD session, the session ended early with only about 1 hour HD completed, and nurse reported that patient gained about 9 kg compared to Tuesday and sent her directly to ED.   ED Course: Patient was found afebrile, none tachycardia blood pressure elevated systolic 170-190, hypoxic 91% on room air stabilized on 2 L.  X-ray showed pulmonary congestion with small right-sided pleural effusion.  Blood work showed creatinine 2.9, hemoglobin 9.6 compared to her baseline 10-11  Review of Systems: As per HPI otherwise 14 point review of systems negative.    Past Medical History:  Diagnosis Date   Ambulates with cane    Constipation    Depression    ESRD (end stage renal disease) (HCC)    a. TTS Dialysis.   GERD (gastroesophageal reflux disease)    History of stress test    a. 01/2003 MV: EF 74%, no ischemia/infarct.   Hyperlipidemia    Hypertension    Osteoporosis    Stroke [facility]) 04-01-11   left frontal subcortical, saw Dr. Pearlean Brownie    Syncope 11/2019   TIA (transient ischemic attack) 03-12-11   Tobacco abuse    Tricuspid regurgitation     a. 05/2016 Echo: EF 65-70%, Gr2DD, mild MR, nl RV fxn, Triv TR, PASP ; b. 05/2018 Echo: EF 60-65%, Gr2DD, mild MR/TR, RVSP/PASP ; c. 11/2019 Echo: EF 55-60%, no rwma, mild-mod MR, Sev TR w/ RV dilatation (CTA chest neg for PE).   Type II diabetes mellitus (HCC)    Vitamin D deficiency     Past Surgical History:  Procedure Laterality Date   AV FISTULA PLACEMENT Left 02/21/2019   Procedure: BRACHIOCEPHALIC ARTERIOVENOUS (AV) FISTULA CREATION;  Surgeon: Cephus Shelling, MD;  Location: [facility] OR;  Service: Vascular;  Laterality: Left;   BASCILIC VEIN TRANSPOSITION Left 04/11/2019   Procedure: SECOND STAGE BASILIC VEIN TRANSPOSITION LEFT ARM;  Surgeon: Cephus Shelling, MD;  Location: [facility] OR;  Service: Vascular;  Laterality: Left;   IR FLUORO GUIDE CV LINE RIGHT  04/29/2019   IR PARACENTESIS  02/20/2021   IR US GUIDE VASC ACCESS RIGHT  04/29/2019   OPEN REDUCTION INTERNAL FIXATION (ORIF) DISTAL RADIAL FRACTURE Left 01/28/2016   Procedure: OPEN REDUCTION INTERNAL FIXATION (ORIF) DISTAL RADIAL FRACTURE;  Surgeon: Bradly Bienenstock, MD;  Location: [facility] OR;  Service: Orthopedics;  Laterality: Left;   REFRACTIVE SURGERY     [facility]   RIGHT HEART CATH N/A 12/05/2019   Procedure: RIGHT HEART CATH;  Surgeon: Yvonne Kendall, MD;  Location: [facility] INVASIVE CV LAB;  Service: Cardiovascular;  Laterality: N/A;   TONSILLECTOMY AND ADENOIDECTOMY     age 1  reports that she has been smoking cigarettes. She has a 9.60 pack-year smoking history. She has been exposed to tobacco smoke. She has never used smokeless tobacco. She reports that she does not drink alcohol and does not use drugs.  Allergies  Allergen Reactions   Hydrocodone Nausea And Vomiting and Other (See Comments)    "Allergic," per MAR   Hydrocodone-Acetaminophen     Other Reaction(s): GI Intolerance    Family History  Problem Relation Age of Onset   Stroke Mother    Diabetes Mother    Kidney failure Mother    Heart  failure Mother    Stroke Father    Cancer Sister        Breast- 57's   Colon cancer Maternal Grandmother    Pancreatic cancer Neg Hx    Esophageal cancer Neg Hx    Liver disease Neg Hx    Stomach cancer Neg Hx      Prior to Admission medications   Medication Sig Start Date End Date Taking? Authorizing Provider  acetaminophen (TYLENOL) 500 MG tablet Take 500 mg by mouth every 6 (six) hours as needed for mild pain.    [provider]  aspirin 81 MG chewable tablet Chew 81 mg by mouth daily.    [provider]  atorvastatin (LIPITOR) 40 MG tablet Take 40 mg by mouth at bedtime.     [provider]  b complex-vitamin c-folic acid (NEPHRO-VITE) 0.8 MG TABS tablet Take 1 tablet by mouth at bedtime.    [provider]  carvedilol (COREG) 6.25 MG tablet Take 1 tablet (6.25 mg total) by mouth 2 (two) times daily with a meal. 02/22/21   Leroy Sea, MD  cetirizine (ZYRTEC) 10 MG tablet Take 10 mg by mouth daily as needed for allergies.    [provider]  chlorhexidine (PERIDEX) 0.12 % solution Use as directed 15 mLs in the mouth or throat 2 (two) times daily. 11/29/22   Palumbo, April, MD  cloNIDine (CATAPRES) 0.1 MG tablet Take 0.1 mg by mouth every 8 (eight) hours as needed (BP>160/90).    [provider]  COMBIVENT RESPIMAT 20-100 MCG/ACT AERS respimat Inhale 1 puff into the lungs every 6 (six) hours as needed for wheezing or shortness of breath. 07/14/22   [provider]  esomeprazole (NEXIUM) 40 MG capsule Take 40 mg by mouth daily.    [provider]  FLUoxetine (PROZAC) 10 MG capsule Take 10 mg by mouth daily. 12/21/20   [provider]  Lido-PE-Glycerin-Petrolatum (PREPARATION H RAPID RELIEF EX) Apply topically every 6 (six) hours as needed (hemorrhoid).    [provider]  loperamide (IMODIUM) 2 MG capsule Take 2 mg by mouth daily as needed for diarrhea or loose stools.    [provider]   midodrine (PROAMATINE) 10 MG tablet Take 10 mg by mouth See admin instructions. Take 1 tablet (10 mg) by mouth prior to dialysis on Tuesday, Thursday, Saturday    [provider]  mirtazapine (REMERON) 7.5 MG tablet Take 7.5 mg by mouth at bedtime. 06/27/22   [provider]  Nutritional Supplements ([employer] RENAL) LIQD Take 237 mLs by mouth with breakfast, with lunch, and with evening meal.    [provider]  omega-3 acid ethyl esters (LOVAZA) 1 g capsule Take 2 capsules by mouth 2 (two) times daily. 05/28/20   [provider]  ondansetron (ZOFRAN) 4 MG tablet Take 4 mg by mouth every 8 (eight) hours as needed for  nausea or vomiting.    [provider]  SACCHAROMYCES BOULARDII PO Take 1 capsule by mouth 2 (two) times daily.    [provider]  senna-docusate (SENOKOT-S) 8.6-50 MG tablet Take 1 tablet by mouth at bedtime as needed for mild constipation. 06/11/18   Regalado, [name], MD  sevelamer carbonate (RENVELA) 800 MG tablet Take 800 mg by mouth 3 (three) times daily with meals.    [provider]  Skin Protectants, Misc. (EUCERIN) cream Apply 1 Application topically 2 (two) times daily.    [provider]  SYNTHROID 88 MCG tablet Take 88 mcg by mouth daily before breakfast. 07/12/22   [provider]    Physical Exam: Vitals:   12/04/22 1645 12/04/22 1700 12/04/22 1715 12/04/22 1730  BP: (!) 195/114 (!) 186/62 (!) 173/137 (!) 178/82  Pulse: 64 66 67 65  Resp: 20 18 (!) 21 16  Temp:      TempSrc:      SpO2: 90% 91% 92% 96%    Constitutional: NAD, calm, comfortable Vitals:   12/04/22 1645 12/04/22 1700 12/04/22 1715 12/04/22 1730  BP: (!) 195/114 (!) 186/62 (!) 173/137 (!) 178/82  Pulse: 64 66 67 65  Resp: 20 18 (!) 21 16  Temp:      TempSrc:      SpO2: 90% 91% 92% 96%   Eyes: PERRL, lids and conjunctivae normal ENMT: Mucous membranes are moist. Posterior pharynx clear of any exudate or  lesions.Normal dentition.  Neck: normal, supple, no masses, no thyromegaly Respiratory: clear to auscultation bilaterally, no wheezing, bilateral lower fields crackles. Normal respiratory effort. No accessory muscle use.  Cardiovascular: Regular rate and rhythm, no murmurs / rubs / gallops. No extremity edema. 2+ pedal pulses. No carotid bruits.  Abdomen: tenderness on all quadrants, no rebound no guarding, no masses palpated. No hepatosplenomegaly.  Decreased bowel sounds on 4 quadrents.  Positive ascites signs Musculoskeletal: no clubbing / cyanosis. No joint deformity upper and lower extremities. Good ROM, no contractures. Normal muscle tone.  Skin: no rashes, lesions, ulcers. No induration Neurologic: CN 2-12 grossly intact. Sensation intact, DTR normal. Strength 5/5 in all 4.  Psychiatric: Normal judgment and insight. Alert and oriented x 3. Normal mood.    Labs on Admission: I have personally reviewed following labs and imaging studies  CBC: Recent Labs  Lab 12/04/22 1425  WBC 5.5  NEUTROABS 3.8  HGB 9.6*  HCT 29.7*  MCV 96.4  PLT 149*   Basic Metabolic Panel: Recent Labs  Lab 12/04/22 1425  NA 134*  K 3.9  CL 91*  CO2 30  GLUCOSE 99  BUN 19  CREATININE 2.93*  CALCIUM 8.7*   GFR: Estimated Creatinine Clearance: 10 mL/min (A) (by C-G formula based on SCr of 2.93 mg/dL (H)). Liver Function Tests: Recent Labs  Lab 12/04/22 1425  AST 28  ALT 11  ALKPHOS 82  BILITOT 0.8  PROT 6.4*  ALBUMIN 3.3*   No results for input(s): "LIPASE", "AMYLASE" in the last 168 hours. No results for input(s): "AMMONIA" in the last 168 hours. Coagulation Profile: No results for input(s): "INR", "PROTIME" in the last 168 hours. Cardiac Enzymes: No results for input(s): "CKTOTAL", "CKMB", "CKMBINDEX", "TROPONINI" in the last 168 hours. BNP (last 3 results) No results for input(s): "PROBNP" in the last 8760 hours. HbA1C: No results for input(s): "HGBA1C" in the last 72  hours. CBG: No results for input(s): "GLUCAP" in the last 168 hours. Lipid Profile: No results for input(s): "CHOL", "HDL", "  LDLCALC", "TRIG", "CHOLHDL", "LDLDIRECT" in the last 72 hours. Thyroid Function Tests: No results for input(s): "TSH", "T4TOTAL", "FREET4", "T3FREE", "THYROIDAB" in the last 72 hours. Anemia Panel: No results for input(s): "VITAMINB12", "FOLATE", "FERRITIN", "TIBC", "IRON", "RETICCTPCT" in the last 72 hours. Urine analysis:    Component Value Date/Time   COLORURINE YELLOW 02/17/2021 0322   APPEARANCEUR HAZY (A) 02/17/2021 0322   LABSPEC 1.011 02/17/2021 0322   PHURINE 8.0 02/17/2021 0322   GLUCOSEU 50 (A) 02/17/2021 0322   HGBUR NEGATIVE 02/17/2021 0322   BILIRUBINUR NEGATIVE 02/17/2021 0322   BILIRUBINUR n 05/25/2012 1202   KETONESUR 5 (A) 02/17/2021 0322   PROTEINUR 100 (A) 02/17/2021 0322   UROBILINOGEN 0.2 05/25/2012 1202   UROBILINOGEN 0.2 03/29/2011 0131   NITRITE NEGATIVE 02/17/2021 0322   LEUKOCYTESUR SMALL (A) 02/17/2021 0322    Radiological Exams on Admission: DG Chest Port 1 View  Result Date: 12/04/2022 CLINICAL DATA:  Shortness of breath EXAM: PORTABLE CHEST 1 VIEW COMPARISON:  Chest x-ray dated August 03, 2022 FINDINGS: Unchanged cardiomegaly. Lungs are clear. Probable small right pleural effusion, overlying leads somewhat limits evaluation. No evidence of pneumothorax. IMPRESSION: Probable small right pleural effusion. Electronically Signed   By: Allegra Lai M.D.   On: 12/04/2022 15:11    EKG: Independently reviewed.  Sinus rhythm, chronic RBBB, no acute ST changes.  Assessment/Plan Active Problems:   ESRD (end stage renal disease) (HCC)   Acute on chronic diastolic CHF (congestive heart failure) (HCC)   Type II diabetes mellitus (HCC)   Abdominal ascites   CHF (congestive heart failure) (HCC)  (please populate well all problems here in Problem List. (For example, if patient is on BP meds at home and you resume or decide to hold  them, it is a problem that needs to be her. Same for CAD, COPD, HLD and so on)  Acute abdominal pain -With significant new ascites signs and weight gaining, will order CT abd without contrast -IR guided paracentesis, likely in AM -Discussed with on-call nephrology Dr. Arlean Hopping at bedside, who will arrange emergency dialysis -The DDx, no significant drop of H&H, CT abdomen pelvis to rule out retroperitoneal hemorrhage. Aorta U/S to rule out aneurysm or dissection -Dilaudid for pain  Acute on chronic HFpEF decompensation -Predominantly ascites But no significant peripheral edema or abdominal wall edema -Management as above  Acute hypoxic respiratory -Secondary to CHF decompensation, management as above  HTN, uncontrolled -Borderline bradycardia, continue current dosage of Coreg -Amlodipine 10 mg daily ordered -As needed hydralazine  ESRD -As above  Hypothyroidism -Continue Synthroid  IDDM -Most recent A1c 5.7, hold off diabetic medications    DVT prophylaxis: Heparin subcu Code Status: DNR Family Communication: None at bedside Disposition Plan: Expect less than 2 midnight hospital stay Consults called: Nephrology Admission status: Telemetry observation   Emeline General MD [facility] Pager [phone]  12/04/2022, 5:47 PM

## 2022-12-04 NOTE — ED Triage Notes (Signed)
Patient BIB EMS from Dialysis, 150 of fluid taken off today, since Tuesday gain 9kg of weight. Tuesday 3 liters off. Place on 3liters via [place]. ABD distended.  10/10 pain ABD.196/84

## 2022-12-05 ENCOUNTER — Observation Stay (HOSPITAL_COMMUNITY): Payer: Medicare Other

## 2022-12-05 DIAGNOSIS — N186 End stage renal disease: Secondary | ICD-10-CM | POA: Diagnosis present

## 2022-12-05 DIAGNOSIS — K8020 Calculus of gallbladder without cholecystitis without obstruction: Secondary | ICD-10-CM | POA: Diagnosis present

## 2022-12-05 DIAGNOSIS — Z794 Long term (current) use of insulin: Secondary | ICD-10-CM | POA: Diagnosis not present

## 2022-12-05 DIAGNOSIS — R188 Other ascites: Secondary | ICD-10-CM | POA: Diagnosis present

## 2022-12-05 DIAGNOSIS — I132 Hypertensive heart and chronic kidney disease with heart failure and with stage 5 chronic kidney disease, or end stage renal disease: Secondary | ICD-10-CM | POA: Diagnosis present

## 2022-12-05 DIAGNOSIS — I5033 Acute on chronic diastolic (congestive) heart failure: Secondary | ICD-10-CM | POA: Diagnosis present

## 2022-12-05 DIAGNOSIS — K219 Gastro-esophageal reflux disease without esophagitis: Secondary | ICD-10-CM | POA: Diagnosis present

## 2022-12-05 DIAGNOSIS — E1122 Type 2 diabetes mellitus with diabetic chronic kidney disease: Secondary | ICD-10-CM | POA: Diagnosis present

## 2022-12-05 DIAGNOSIS — D696 Thrombocytopenia, unspecified: Secondary | ICD-10-CM | POA: Diagnosis present

## 2022-12-05 DIAGNOSIS — D631 Anemia in chronic kidney disease: Secondary | ICD-10-CM | POA: Diagnosis present

## 2022-12-05 DIAGNOSIS — Z87891 Personal history of nicotine dependence: Secondary | ICD-10-CM | POA: Diagnosis not present

## 2022-12-05 DIAGNOSIS — I071 Rheumatic tricuspid insufficiency: Secondary | ICD-10-CM | POA: Diagnosis present

## 2022-12-05 DIAGNOSIS — Y841 Kidney dialysis as the cause of abnormal reaction of the patient, or of later complication, without mention of misadventure at the time of the procedure: Secondary | ICD-10-CM | POA: Diagnosis present

## 2022-12-05 DIAGNOSIS — F32A Depression, unspecified: Secondary | ICD-10-CM | POA: Diagnosis present

## 2022-12-05 DIAGNOSIS — M810 Age-related osteoporosis without current pathological fracture: Secondary | ICD-10-CM | POA: Diagnosis present

## 2022-12-05 DIAGNOSIS — Z66 Do not resuscitate: Secondary | ICD-10-CM | POA: Diagnosis present

## 2022-12-05 DIAGNOSIS — Z992 Dependence on renal dialysis: Secondary | ICD-10-CM | POA: Diagnosis not present

## 2022-12-05 DIAGNOSIS — J918 Pleural effusion in other conditions classified elsewhere: Secondary | ICD-10-CM | POA: Diagnosis present

## 2022-12-05 DIAGNOSIS — Z79890 Hormone replacement therapy (postmenopausal): Secondary | ICD-10-CM | POA: Diagnosis not present

## 2022-12-05 DIAGNOSIS — J9601 Acute respiratory failure with hypoxia: Secondary | ICD-10-CM | POA: Diagnosis present

## 2022-12-05 DIAGNOSIS — Z8673 Personal history of transient ischemic attack (TIA), and cerebral infarction without residual deficits: Secondary | ICD-10-CM | POA: Diagnosis not present

## 2022-12-05 DIAGNOSIS — I160 Hypertensive urgency: Secondary | ICD-10-CM | POA: Diagnosis present

## 2022-12-05 DIAGNOSIS — E785 Hyperlipidemia, unspecified: Secondary | ICD-10-CM | POA: Diagnosis present

## 2022-12-05 DIAGNOSIS — I509 Heart failure, unspecified: Secondary | ICD-10-CM | POA: Diagnosis present

## 2022-12-05 DIAGNOSIS — T82838A Hemorrhage of vascular prosthetic devices, implants and grafts, initial encounter: Secondary | ICD-10-CM | POA: Diagnosis present

## 2022-12-05 DIAGNOSIS — E039 Hypothyroidism, unspecified: Secondary | ICD-10-CM | POA: Diagnosis present

## 2022-12-05 LAB — BASIC METABOLIC PANEL
Anion gap: 15 (ref 5–15)
BUN: 5 mg/dL — ABNORMAL LOW (ref 8–23)
CO2: 24 mmol/L (ref 22–32)
Calcium: 8.9 mg/dL (ref 8.9–10.3)
Chloride: 95 mmol/L — ABNORMAL LOW (ref 98–111)
Creatinine, Ser: 1.36 mg/dL — ABNORMAL HIGH (ref 0.44–1.00)
GFR, Estimated: 44 mL/min — ABNORMAL LOW (ref >60)
Glucose, Bld: 68 mg/dL — ABNORMAL LOW (ref 70–99)
Potassium: 3.5 mmol/L (ref 3.5–5.1)
Sodium: 134 mmol/L — ABNORMAL LOW (ref 135–145)

## 2022-12-05 LAB — HEPATITIS B SURFACE ANTIGEN: Hepatitis B Surface Ag: NONREACTIVE

## 2022-12-05 MED ORDER — DARBEPOETIN ALFA 100 MCG/0.5ML IJ SOSY
100.0000 ug | PREFILLED_SYRINGE | INTRAMUSCULAR | Status: DC
  Administered 2022-12-06: 100 ug via SUBCUTANEOUS
  Filled 2022-12-05: qty 0.5

## 2022-12-05 MED ORDER — LIDOCAINE HCL 1 % IJ SOLN
INTRAMUSCULAR | Status: AC
  Filled 2022-12-05: qty 20

## 2022-12-05 NOTE — Progress Notes (Signed)
[facility] Progress Note   Subjective:  Seen in room - looks comfortable. Says breathing and her abd pain are much better today s/p HD last night with 3L off. CT abd showed ascites and R pleural effusion. Abd still distended, but softer.  Objective Vitals:   12/05/22 0500 12/05/22 0537 12/05/22 0754 12/05/22 0925  BP:  (!) 156/59 (!) 177/62 (!) 161/64  Pulse:  74 79 77  Resp:  15 18 18   Temp:  98.2 F (36.8 C) 97.8 F (36.6 C)   TempSrc:  Oral Oral   SpO2:  92% 93% 95%  Weight: 41.3 kg     Height:       Physical Exam General: Petite woman, NAD. Room air Heart: RRR; no murmur Lungs: CTAB Abdomen: distended, but soft, non-tender Extremities: no LE edema Dialysis Access: [name] AVF + bruit  Additional Objective Labs: Basic Metabolic Panel: Recent Labs  Lab 12/04/22 1425 12/04/22 2030 12/05/22 0547  NA 134*  --  134*  K 3.9  --  3.5  CL 91*  --  95*  CO2 30  --  24  GLUCOSE 99  --  68*  BUN 19  --  5*  CREATININE 2.93*  --  1.36*  CALCIUM 8.7*  --  8.9  PHOS  --  4.8*  --    Liver Function Tests: Recent Labs  Lab 12/04/22 1425  AST 28  ALT 11  ALKPHOS 82  BILITOT 0.8  PROT 6.4*  ALBUMIN 3.3*   CBC: Recent Labs  Lab 12/04/22 1425  WBC 5.5  NEUTROABS 3.8  HGB 9.6*  HCT 29.7*  MCV 96.4  PLT 149*   Studies/Results: US AORTA  Result Date: 12/04/2022 CLINICAL DATA:  Abdominal pain EXAM: ULTRASOUND OF ABDOMINAL AORTA TECHNIQUE: Ultrasound examination of the abdominal aorta and proximal common iliac arteries was performed to evaluate for aneurysm. Additional color and Doppler images of the distal aorta were obtained to document patency. COMPARISON:  CT from earlier in the same day. FINDINGS: Abdominal aortic measurements as follows: Proximal:  1.9 cm Mid:  1.0 cm Distal: Not well visualized Right common iliac artery: Not well visualized Left common iliac artery: Not well visualized IMPRESSION: Extremely limited exam. No evidence of abdominal  aortic aneurysm is seen. These findings correspond with that noted on recent CT examination from 1 hour previous Electronically Signed   By: Alcide Clever M.D.   On: 12/04/2022 20:11   CT ABDOMEN PELVIS WO CONTRAST  Result Date: 12/04/2022 CLINICAL DATA:  Abdominal pain. EXAM: CT ABDOMEN AND PELVIS WITHOUT CONTRAST TECHNIQUE: Multidetector CT imaging of the abdomen and pelvis was performed following the standard protocol without IV contrast. RADIATION DOSE REDUCTION: This exam was performed according to the departmental dose-optimization program which includes automated exposure control, adjustment of the mA and/or kV according to patient size and/or use of iterative reconstruction technique. COMPARISON:  January 25, 2021 FINDINGS: Lower chest: Mild-to-moderate severity compressive atelectasis is seen within the posterior aspect of the right lower lobe. A small to moderate size right pleural effusion is present. Hepatobiliary: No focal liver abnormality is seen. The gallbladder appears to be contracted and contains a 10 mm gallstone. This represents a new finding when compared to the prior study. Pancreas: Unremarkable. No pancreatic ductal dilatation or surrounding inflammatory changes. Spleen: Normal in size without focal abnormality. Adrenals/Urinary Tract: Adrenal glands are unremarkable. Kidneys are normal in size, without obstructing renal calculi, focal lesion, or hydronephrosis. 2 mm nonobstructing renal calculi are seen within  the mid and lower left kidney. These are present on the prior study. Persistent, nonspecific marked severity bilateral perinephric inflammatory fat stranding is seen. Bladder is unremarkable. Stomach/Bowel: Stomach is within normal limits. Appendix appears normal. No evidence of bowel wall thickening, distention, or inflammatory changes. Vascular/Lymphatic: Aortic atherosclerosis. No enlarged abdominal or pelvic lymph nodes. Reproductive: Uterus and bilateral adnexa are unremarkable.  Other: Mild anasarca is seen throughout the anterior and lateral abdominal walls. There is moderate to marked severity abdominopelvic ascites. Musculoskeletal: Chronic fracture deformities are seen involving the bilateral inferior pubic rami. This represents a new finding when compared to the prior study. Multiple chronic bilateral rib fractures are noted. No acute or significant osseous findings. IMPRESSION: 1. Small to moderate size right pleural effusion with mild-to-moderate severity right lower lobe compressive atelectasis. 2. Cholelithiasis. 3. 2 mm nonobstructing left renal calculi. 4. Moderate to marked severity abdominopelvic ascites. 5. Mild anasarca. 6. Aortic atherosclerosis. Aortic Atherosclerosis (ICD10-I70.0). Electronically Signed   By: Aram Candela M.D.   On: 12/04/2022 19:53   DG Chest Port 1 View  Result Date: 12/04/2022 CLINICAL DATA:  Shortness of breath EXAM: PORTABLE CHEST 1 VIEW COMPARISON:  Chest x-ray dated August 03, 2022 FINDINGS: Unchanged cardiomegaly. Lungs are clear. Probable small right pleural effusion, overlying leads somewhat limits evaluation. No evidence of pneumothorax. IMPRESSION: Probable small right pleural effusion. Electronically Signed   By: Allegra Lai M.D.   On: 12/04/2022 15:11   Medications:  sodium chloride      Dialysis Orders:  [facility]  3h  350/500   33kg  2/2 bath  Heparin none  AVF - last OP HD was Tuesday, came off 6kg up - presented to HD today 9kg up, unable to pull more that 2 L it looks for the last 2 wks - Mircera 100 q 2 weeks - last 6/1    Assessment/ Plan: Volume overload: CT with R pleural effusions and ascites. Ongoing large gains and only able to get off 2L q HD d/t recommended max UF and her low body weight. She did tolerate 3L off with HD yesterday, will plan to dialyze here tomorrow and will speak with her OP unit about trying to ^ UFG temporarily until she gets back to goal. Can consider therapeutic  paracentesis as well. Her symptoms are improved today. Will also need to reiterate fluids restrictions to her SNF. She also has significant tricuspid regurgitation. ESRD: Continue HD on TTS schedule - she has not missed any OP sessions, her problem is her weight is so low that general outpatient procedure prohibits pulling a significant amount off her q HD (rec goal < 63mL/kg/hr). BUN/Cr are falsely low due to being checked immediately after HD and her lack of muscle mass. Next HD tomorrow - will try to maximize UF as tolerated. HTN/volume: See above, UF as tolerated. BP high. 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 Bone/Mineral: Ca/Phos to goal, continue binders. Hx CVA    Lily Peer 12/05/2022, 10:29 AM  [employer]

## 2022-12-05 NOTE — Progress Notes (Signed)
PROGRESS NOTE  [name] Michael  ZOX:[id number] DOB: 1960/07/25 DOA: 12/04/2022 PCP: Renford Dills, MD   Brief Narrative: Patient is a 62 year old female with history of ESRD on dialysis on TTS schedule, chronic diastolic CHF, hypothyroidism, insulin-dependent diabetes, anemia of chronic disease, GERD, hyperlipidemia who presented with abdominal distention, pain with associated nausea, weight gain.  On presentation, she was found to be hypertensive, hypoxic requiring 2 L of oxygen.  X-ray showed pulmonary congestion with a small right-sided pleural effusion.  Nephrology consulted for dialysis.  Assessment & Plan:  Active Problems:   ESRD on hemodialysis (HCC)   Acute on chronic diastolic CHF (congestive heart failure) (HCC)   Type II diabetes mellitus (HCC)   Abdominal ascites   CHF (congestive heart failure) (HCC)  Abdominal pain, distention from ascites: Presented with abdominal distention, pain, weight gain.  Likely associated with fluid overload from ESRD.  CT abdomen/pelvis showed moderate to severe ascites, small to moderate right-sided pleural effusion.  No focal abnormality in the liver as per CT abdomen/pelvis.  Shows 10 mm gallstone which needs to be monitored as an outpatient.  Ultrasound of the aorta did not show any abdominal aortic aneurysm. Radiology attempted paracentesis but found to have only trace amount so paracentesis not done.  ESRD on dialysis: Dialyzed on TTS schedule.  Nephrology already following.  Underwent dialysis.  Continue phosphate binders.  Acute on chronic diastolic CHF: Volume management as per dialysis.  This could also be contributing to ascites.  No significant peripheral edema.  Chest x-ray on presentation showed pulmonary congestion.Elevated BNP.  Acute hypoxic respiratory failure: Secondary to ESRD causing volume overload, diastolic CHF.  Hopefully she will not require oxygen after adequate fluid is removed by dialysis  Uncontrolled hypertension:  Continue Coreg, amlodipine.  Continue as needed medication for severe hypertension  Hypothyroidism: Continue thyroid  Insulin-dependent diabetes: Recent A1c of 5.7.  Continue sliding scale  insulin        DVT prophylaxis:heparin injection 5,000 Units Start: 12/04/22 2200     Code Status: DNR  Family Communication: None at the bedside  Patient status: Observation  Patient is from : SNF  Anticipated discharge to: SNF  Estimated DC date: 1 to 2 days   Consultants: Nephrology   Procedures: Dialysis  Antimicrobials:  Anti-infectives (From admission, onward)    None       Subjective: Patient seen and examined at the bedside today.  On 2 L of oxygen.  Denies worsening shortness of breath or cough. She does not complain of abdomen pain this morning.  She had a good night sleep.  Abdomen still appears little distended but soft and nontender.  Objective: Vitals:   12/05/22 0420 12/05/22 0436 12/05/22 0500 12/05/22 0537  BP: (!) 134/45 (!) 150/70  (!) 156/59  Pulse: (!) 57 69  74  Resp: 12 12  15   Temp:  98.1 F (36.7 C)  98.2 F (36.8 C)  TempSrc:  Oral  Oral  SpO2: 99% 99%  92%  Weight:   41.3 kg   Height:        Intake/Output Summary (Last 24 hours) at 12/05/2022 0751 Last data filed at 12/05/2022 0436 Gross per 24 hour  Intake 123 ml  Output 3025 ml  Net -2902 ml   Filed Weights   12/04/22 2024 12/05/22 0035 12/05/22 0500  Weight: 41.9 kg 41.3 kg 41.3 kg    Examination:  General exam: Overall comfortable, not in distress, very deconditioned HEENT: PERRL Respiratory system:  no wheezes or crackles ,  mildly diminished sounds on the right side Cardiovascular system: S1 & S2 heard, RRR.  Gastrointestinal system: Abdomen is distended, soft and nontender. Central nervous system: Alert and oriented Extremities: No edema, no clubbing ,no cyanosis, AV fistula on the left arm Skin: No rashes, no ulcers,no icterus     Data Reviewed: I have personally  reviewed following labs and imaging studies  CBC: Recent Labs  Lab 12/04/22 1425  WBC 5.5  NEUTROABS 3.8  HGB 9.6*  HCT 29.7*  MCV 96.4  PLT 149*   Basic Metabolic Panel: Recent Labs  Lab 12/04/22 1425 12/04/22 2030 12/05/22 0547  NA 134*  --  134*  K 3.9  --  3.5  CL 91*  --  95*  CO2 30  --  24  GLUCOSE 99  --  68*  BUN 19  --  5*  CREATININE 2.93*  --  1.36*  CALCIUM 8.7*  --  8.9  PHOS  --  4.8*  --      No results found for this or any previous visit (from the past 240 hour(s)).   Radiology Studies: US AORTA  Result Date: 12/04/2022 CLINICAL DATA:  Abdominal pain EXAM: ULTRASOUND OF ABDOMINAL AORTA TECHNIQUE: Ultrasound examination of the abdominal aorta and proximal common iliac arteries was performed to evaluate for aneurysm. Additional color and Doppler images of the distal aorta were obtained to document patency. COMPARISON:  CT from earlier in the same day. FINDINGS: Abdominal aortic measurements as follows: Proximal:  1.9 cm Mid:  1.0 cm Distal: Not well visualized Right common iliac artery: Not well visualized Left common iliac artery: Not well visualized IMPRESSION: Extremely limited exam. No evidence of abdominal aortic aneurysm is seen. These findings correspond with that noted on recent CT examination from 1 hour previous Electronically Signed   By: Alcide Clever M.D.   On: 12/04/2022 20:11   CT ABDOMEN PELVIS WO CONTRAST  Result Date: 12/04/2022 CLINICAL DATA:  Abdominal pain. EXAM: CT ABDOMEN AND PELVIS WITHOUT CONTRAST TECHNIQUE: Multidetector CT imaging of the abdomen and pelvis was performed following the standard protocol without IV contrast. RADIATION DOSE REDUCTION: This exam was performed according to the departmental dose-optimization program which includes automated exposure control, adjustment of the mA and/or kV according to patient size and/or use of iterative reconstruction technique. COMPARISON:  January 25, 2021 FINDINGS: Lower chest:  Mild-to-moderate severity compressive atelectasis is seen within the posterior aspect of the right lower lobe. A small to moderate size right pleural effusion is present. Hepatobiliary: No focal liver abnormality is seen. The gallbladder appears to be contracted and contains a 10 mm gallstone. This represents a new finding when compared to the prior study. Pancreas: Unremarkable. No pancreatic ductal dilatation or surrounding inflammatory changes. Spleen: Normal in size without focal abnormality. Adrenals/Urinary Tract: Adrenal glands are unremarkable. Kidneys are normal in size, without obstructing renal calculi, focal lesion, or hydronephrosis. 2 mm nonobstructing renal calculi are seen within the mid and lower left kidney. These are present on the prior study. Persistent, nonspecific marked severity bilateral perinephric inflammatory fat stranding is seen. Bladder is unremarkable. Stomach/Bowel: Stomach is within normal limits. Appendix appears normal. No evidence of bowel wall thickening, distention, or inflammatory changes. Vascular/Lymphatic: Aortic atherosclerosis. No enlarged abdominal or pelvic lymph nodes. Reproductive: Uterus and bilateral adnexa are unremarkable. Other: Mild anasarca is seen throughout the anterior and lateral abdominal walls. There is moderate to marked severity abdominopelvic ascites. Musculoskeletal: Chronic fracture deformities are seen involving the bilateral inferior pubic rami. This  represents a new finding when compared to the prior study. Multiple chronic bilateral rib fractures are noted. No acute or significant osseous findings. IMPRESSION: 1. Small to moderate size right pleural effusion with mild-to-moderate severity right lower lobe compressive atelectasis. 2. Cholelithiasis. 3. 2 mm nonobstructing left renal calculi. 4. Moderate to marked severity abdominopelvic ascites. 5. Mild anasarca. 6. Aortic atherosclerosis. Aortic Atherosclerosis (ICD10-I70.0). Electronically  Signed   By: Aram Candela M.D.   On: 12/04/2022 19:53   DG Chest Port 1 View  Result Date: 12/04/2022 CLINICAL DATA:  Shortness of breath EXAM: PORTABLE CHEST 1 VIEW COMPARISON:  Chest x-ray dated August 03, 2022 FINDINGS: Unchanged cardiomegaly. Lungs are clear. Probable small right pleural effusion, overlying leads somewhat limits evaluation. No evidence of pneumothorax. IMPRESSION: Probable small right pleural effusion. Electronically Signed   By: Allegra Lai M.D.   On: 12/04/2022 15:11    Scheduled Meds:  acidophilus  2 capsule Oral BID   amLODipine  10 mg Oral Daily   aspirin  81 mg Oral Daily   atorvastatin  40 mg Oral QHS   carvedilol  6.25 mg Oral BID WC   Chlorhexidine Gluconate Cloth  6 each Topical Q0600   FLUoxetine  10 mg Oral Daily   heparin  5,000 Units Subcutaneous Q12H   levothyroxine  88 mcg Oral QAC breakfast   loratadine  10 mg Oral Daily   mirtazapine  7.5 mg Oral QHS   pantoprazole  40 mg Oral Daily   sevelamer carbonate  800 mg Oral TID WC   sodium chloride flush  3 mL Intravenous Q12H   Continuous Infusions:  sodium chloride       LOS: 0 days   Burnadette Pop, MD [facility] P6/14/2024, 7:51 AM

## 2022-12-05 NOTE — Progress Notes (Signed)
Patient presents for  therapeutic and diagnostic paracentesis. [name] limited abd shows trace amount of peritoneal fluid noted  Insufficient to perform a safe paracentesis. Procedure not performed.

## 2022-12-05 NOTE — Progress Notes (Signed)
Heart Failure Navigator Progress Note  Assessed for Heart & Vascular TOC clinic readiness.  Patient does not meet criteria due to ESRD on hemodialysis.   Navigator will sign off at this time.    Trayon Krantz, BSN, RN Heart Failure Nurse Navigator Secure Chat Only   

## 2022-12-05 NOTE — Progress Notes (Signed)
Pt receives out-pt HD at [facility] on TTS. Will assist as needed.   Rennae Ferraiolo Renal Navigator [phone] 

## 2022-12-06 ENCOUNTER — Inpatient Hospital Stay (HOSPITAL_COMMUNITY): Payer: Medicare Other

## 2022-12-06 DIAGNOSIS — R188 Other ascites: Secondary | ICD-10-CM | POA: Diagnosis not present

## 2022-12-06 LAB — BASIC METABOLIC PANEL
Anion gap: 13 (ref 5–15)
BUN: 16 mg/dL (ref 8–23)
CO2: 28 mmol/L (ref 22–32)
Calcium: 8.9 mg/dL (ref 8.9–10.3)
Chloride: 92 mmol/L — ABNORMAL LOW (ref 98–111)
Creatinine, Ser: 3 mg/dL — ABNORMAL HIGH (ref 0.44–1.00)
GFR, Estimated: 17 mL/min — ABNORMAL LOW (ref >60)
Glucose, Bld: 105 mg/dL — ABNORMAL HIGH (ref 70–99)
Potassium: 3.6 mmol/L (ref 3.5–5.1)
Sodium: 133 mmol/L — ABNORMAL LOW (ref 135–145)

## 2022-12-06 LAB — HEPATITIS B SURFACE ANTIBODY, QUANTITATIVE
Hep B S AB Quant (Post): 124 m[IU]/mL (ref >9.9)
Hep B S AB Quant (Post): 15.3 m[IU]/mL (ref >9.9)

## 2022-12-06 MED ORDER — CARVEDILOL 12.5 MG PO TABS
12.5000 mg | ORAL_TABLET | Freq: Two times a day (BID) | ORAL | Status: DC
  Administered 2022-12-06 – 2022-12-07 (×3): 12.5 mg via ORAL
  Filled 2022-12-06 (×3): qty 1

## 2022-12-06 NOTE — Progress Notes (Signed)
   12/06/22 1249  Vitals  Temp 97.8 F (36.6 C)  Pulse Rate 69  Resp (!) 24  BP (!) 163/58  SpO2 100 %  O2 Device Nasal Cannula  Oxygen Therapy  O2 Flow Rate (L/min) 2 L/min  Post Treatment  Dialyzer Clearance Clear  Duration of HD Treatment -hour(s) 3.49 hour(s)  Hemodialysis Intake (mL) 0 mL  Liters Processed 79.8  Fluid Removed (mL) 2700 mL  Tolerated HD Treatment Yes  AVG/AVF Arterial Site Held (minutes) 10 minutes  AVG/AVF Venous Site Held (minutes) 10 minutes   Received patient in bed to unit.  Alert and oriented.  Informed consent signed and in chart.   TX duration:3hrs 50 mins  Patient tolerated well.  Transported back to the room  Alert, without acute distress.  Hand-off given to patient's nurse.   Access used: LAVF Access issues: bleeding  Total UF removed: 2.7L Medication(s) given: none   [name]

## 2022-12-06 NOTE — Progress Notes (Signed)
PROGRESS NOTE  [name] Michael  ZOX:[id number] DOB: Jul 14, 1960 DOA: 12/04/2022 PCP: Renford Dills, MD   Brief Narrative: Patient is a 62 year old female with history of ESRD on dialysis on TTS schedule, chronic diastolic CHF, hypothyroidism, insulin-dependent diabetes, anemia of chronic disease, GERD, hyperlipidemia who presented with abdominal distention, pain with associated nausea, weight gain.  On presentation, she was found to be hypertensive, hypoxic requiring 2 L of oxygen.  X-ray showed pulmonary congestion with a small right-sided pleural effusion.  Nephrology consulted for dialysis.  Hospital course remarkable for persistent requirement of oxygen, congestion likely from volume overload.  Assessment & Plan:  Active Problems:   ESRD on hemodialysis (HCC)   Acute on chronic diastolic CHF (congestive heart failure) (HCC)   Type II diabetes mellitus (HCC)   Abdominal ascites   CHF (congestive heart failure) (HCC)  Abdominal pain, distention from ascites: Presented with abdominal distention, pain, weight gain.  Likely associated with fluid overload from ESRD.  CT abdomen/pelvis showed moderate to severe ascites, small to moderate right-sided pleural effusion.  No focal abnormality in the liver as per CT abdomen/pelvis.  Shows 10 mm gallstone which needs to be monitored as an outpatient.  Ultrasound of the aorta did not show any abdominal aortic aneurysm. Radiology attempted paracentesis but found to have only trace amount so paracentesis not done.  ESRD on dialysis: Dialyzed on TTS schedule.  Nephrology already following.  Underwent dialysis.  Continue phosphate binders.  Acute on chronic diastolic CHF: Volume management as per dialysis.  This could also be contributing to ascites.  No significant peripheral edema.  Chest x-ray on presentation showed pulmonary congestion.Elevated BNP.  Acute hypoxic respiratory failure: Secondary to ESRD causing volume overload, diastolic CHF.  Hopefully  she will not require oxygen after adequate fluid is removed by dialysis  Uncontrolled hypertension: Continue Coreg, amlodipine.  Continue as needed medication for severe hypertension.  Still hypertensive.  Increased the dose of Coreg.  Hypothyroidism: Continue thyroid  Insulin-dependent diabetes: Recent A1c of 5.7.  Continue sliding scale  insulin        DVT prophylaxis:heparin injection 5,000 Units Start: 12/04/22 2200     Code Status: DNR  Family Communication: None at the bedside  Patient status: Observation  Patient is from : SNF  Anticipated discharge to: SNF  Estimated DC date: 1 to 2 days   Consultants: Nephrology   Procedures: Dialysis  Antimicrobials:  Anti-infectives (From admission, onward)    None       Subjective: Patient seen and examined at bedside at dialysis site.  She was complaining some shortness of breath.  She was still on 2 L of oxygen.  There was some issue going with the bleeding from the AV fistula site.    Objective: Vitals:   12/06/22 1230 12/06/22 1248 12/06/22 1249 12/06/22 1400  BP: (!) 158/57 (!) 164/67 (!) 163/58 (!) 169/59  Pulse: 67 70 69   Resp: (!) 22 (!) 22 (!) 24 19  Temp:   97.8 F (36.6 C) 98.2 F (36.8 C)  TempSrc:    Oral  SpO2: 100% 100% 100% 99%  Weight:    35.5 kg  Height:        Intake/Output Summary (Last 24 hours) at 12/06/2022 1418 Last data filed at 12/06/2022 1249 Gross per 24 hour  Intake --  Output 2700 ml  Net -2700 ml   Filed Weights   12/05/22 0500 12/06/22 0419 12/06/22 1400  Weight: 41.3 kg 39 kg 35.5 kg    Examination:  G General exam: Overall comfortable, not in distress,deconditioned ,weak HEENT: PERRL Respiratory system:  no wheezes or crackles , diminished sounds bilaterally in bases Cardiovascular system: S1 & S2 heard, RRR.  Gastrointestinal system: Abdomen is mildly distended, soft and nontender. Central nervous system: Alert and oriented Extremities: No edema, no clubbing  ,no cyanosis, av fistula in the left arm Skin: No rashes, no ulcers,no icterus     Data Reviewed: I have personally reviewed following labs and imaging studies  CBC: Recent Labs  Lab 12/04/22 1425  WBC 5.5  NEUTROABS 3.8  HGB 9.6*  HCT 29.7*  MCV 96.4  PLT 149*   Basic Metabolic Panel: Recent Labs  Lab 12/04/22 1425 12/04/22 2030 12/05/22 0547 12/06/22 0031  NA 134*  --  134* 133*  K 3.9  --  3.5 3.6  CL 91*  --  95* 92*  CO2 30  --  24 28  GLUCOSE 99  --  68* 105*  BUN 19  --  5* 16  CREATININE 2.93*  --  1.36* 3.00*  CALCIUM 8.7*  --  8.9 8.9  PHOS  --  4.8*  --   --      No results found for this or any previous visit (from the past 240 hour(s)).   Radiology Studies: IR ABDOMEN US LIMITED  Result Date: 12/05/2022 INDICATION: 62 year old female. History of end-stage renal disease on hemodialysis with thrombocytopenia presented to the emergency room in fluid overload. Request is for therapeutic and diagnostic paracentesis. EXAM: ULTRASOUND ABDOMEN LIMITED FINDINGS: Imaging of all 4 quadrants of the abdomen reveal no ascites IMPRESSION: No ascites seen in ultrasound.  No need for paracentesis. Read by: Anders Grant, NP Electronically Signed   By: Gilmer Mor D.O.   On: 12/05/2022 12:58   US AORTA  Result Date: 12/04/2022 CLINICAL DATA:  Abdominal pain EXAM: ULTRASOUND OF ABDOMINAL AORTA TECHNIQUE: Ultrasound examination of the abdominal aorta and proximal common iliac arteries was performed to evaluate for aneurysm. Additional color and Doppler images of the distal aorta were obtained to document patency. COMPARISON:  CT from earlier in the same day. FINDINGS: Abdominal aortic measurements as follows: Proximal:  1.9 cm Mid:  1.0 cm Distal: Not well visualized Right common iliac artery: Not well visualized Left common iliac artery: Not well visualized IMPRESSION: Extremely limited exam. No evidence of abdominal aortic aneurysm is seen. These findings correspond  with that noted on recent CT examination from 1 hour previous Electronically Signed   By: Alcide Clever M.D.   On: 12/04/2022 20:11   CT ABDOMEN PELVIS WO CONTRAST  Result Date: 12/04/2022 CLINICAL DATA:  Abdominal pain. EXAM: CT ABDOMEN AND PELVIS WITHOUT CONTRAST TECHNIQUE: Multidetector CT imaging of the abdomen and pelvis was performed following the standard protocol without IV contrast. RADIATION DOSE REDUCTION: This exam was performed according to the departmental dose-optimization program which includes automated exposure control, adjustment of the mA and/or kV according to patient size and/or use of iterative reconstruction technique. COMPARISON:  January 25, 2021 FINDINGS: Lower chest: Mild-to-moderate severity compressive atelectasis is seen within the posterior aspect of the right lower lobe. A small to moderate size right pleural effusion is present. Hepatobiliary: No focal liver abnormality is seen. The gallbladder appears to be contracted and contains a 10 mm gallstone. This represents a new finding when compared to the prior study. Pancreas: Unremarkable. No pancreatic ductal dilatation or surrounding inflammatory changes. Spleen: Normal in size without focal abnormality. Adrenals/Urinary Tract: Adrenal glands are unremarkable. Kidneys are normal  in size, without obstructing renal calculi, focal lesion, or hydronephrosis. 2 mm nonobstructing renal calculi are seen within the mid and lower left kidney. These are present on the prior study. Persistent, nonspecific marked severity bilateral perinephric inflammatory fat stranding is seen. Bladder is unremarkable. Stomach/Bowel: Stomach is within normal limits. Appendix appears normal. No evidence of bowel wall thickening, distention, or inflammatory changes. Vascular/Lymphatic: Aortic atherosclerosis. No enlarged abdominal or pelvic lymph nodes. Reproductive: Uterus and bilateral adnexa are unremarkable. Other: Mild anasarca is seen throughout the  anterior and lateral abdominal walls. There is moderate to marked severity abdominopelvic ascites. Musculoskeletal: Chronic fracture deformities are seen involving the bilateral inferior pubic rami. This represents a new finding when compared to the prior study. Multiple chronic bilateral rib fractures are noted. No acute or significant osseous findings. IMPRESSION: 1. Small to moderate size right pleural effusion with mild-to-moderate severity right lower lobe compressive atelectasis. 2. Cholelithiasis. 3. 2 mm nonobstructing left renal calculi. 4. Moderate to marked severity abdominopelvic ascites. 5. Mild anasarca. 6. Aortic atherosclerosis. Aortic Atherosclerosis (ICD10-I70.0). Electronically Signed   By: Aram Candela M.D.   On: 12/04/2022 19:53   DG Chest Port 1 View  Result Date: 12/04/2022 CLINICAL DATA:  Shortness of breath EXAM: PORTABLE CHEST 1 VIEW COMPARISON:  Chest x-ray dated August 03, 2022 FINDINGS: Unchanged cardiomegaly. Lungs are clear. Probable small right pleural effusion, overlying leads somewhat limits evaluation. No evidence of pneumothorax. IMPRESSION: Probable small right pleural effusion. Electronically Signed   By: Allegra Lai M.D.   On: 12/04/2022 15:11    Scheduled Meds:  acidophilus  2 capsule Oral BID   amLODipine  10 mg Oral Daily   aspirin  81 mg Oral Daily   atorvastatin  40 mg Oral QHS   carvedilol  12.5 mg Oral BID WC   Chlorhexidine Gluconate Cloth  6 each Topical Q0600   darbepoetin (ARANESP) injection - DIALYSIS  100 mcg Subcutaneous Q Sat-1800   FLUoxetine  10 mg Oral Daily   heparin  5,000 Units Subcutaneous Q12H   levothyroxine  88 mcg Oral QAC breakfast   loratadine  10 mg Oral Daily   mirtazapine  7.5 mg Oral QHS   pantoprazole  40 mg Oral Daily   sevelamer carbonate  800 mg Oral TID WC   sodium chloride flush  3 mL Intravenous Q12H   Continuous Infusions:  sodium chloride       LOS: 1 day   Burnadette Pop, MD [facility] P6/15/2024, 2:18 PM

## 2022-12-06 NOTE — Plan of Care (Deleted)
[facility] Patient Discharge Orders - [facility] CLINIC: [facility]  Patient's name: LENYX CARDINAS Admit/DC Dates: 12/04/2022 - 12/06/2022  DISCHARGE DIAGNOSES: Volume overload (with B effusions + ascites) - s/p serial HD, closer to EDW now   Attempted paracentesis 6/14 - not enough fluid to tap  HD ORDER CHANGES: Heparin change: no EDW Change: no - keep at 33 kg. Got down to ~35-36 kg range here Bath Change: no  ANEMIA MANAGEMENT: Aranesp: Given:  yes - hopefully. Ordered to be given 6/15 prior to d/c - check to make sure happened! ESA dose for discharge: Mircera 100 mcg IV q 2 weeks, to start on 6/22 (SAME) IV Iron dose at discharge: per protocol Transfusion: Given: no  BONE/MINERAL MEDICATIONS: Hectorol/Calcitriol change: no Sensipar/Parsabiv change: no  ACCESS INTERVENTION/CHANGE: no Details:   RECENT LABS: Recent Labs  Lab 12/04/22 1425 12/04/22 2030 12/05/22 0547 12/06/22 0031  HGB 9.6*  --   --   --   NA 134*  --    < > 133*  K 3.9  --    < > 3.6  CALCIUM 8.7*  --    < > 8.9  PHOS  --  4.8*  --   --   ALBUMIN 3.3*  --   --   --    < > = values in this interval not displayed.   IV ANTIBIOTICS: no Details:  OTHER ANTICOAGULATION: On Coumadin?: no  OTHER/APPTS/LAB ORDERS:  1) Aim for 3L q HD until reaching HD - she can pull it. Trying to keep her out of the hospital. Pls call her SNF and remind them of her 1L/day max fluid restrictions.  D/C Meds to be reconciled by nurse after every discharge.  Completed By:   Reviewed by: MD:______ RN_______

## 2022-12-06 NOTE — Progress Notes (Signed)
Patient returned to room from HD by transport via bed.

## 2022-12-06 NOTE — Progress Notes (Signed)
Patient transported to HD via bed by transporter.  HD CN (Candice) states that patient will received breakfast sandwich in HD.

## 2022-12-06 NOTE — Progress Notes (Signed)
[facility] Progress Note   Subjective:  Seen on HD today - going for another 3L UFG, tolerating. IR unable to perform paracentesis yesterday - volume too low, which is a good sign that she is mobilizing this fluid and that we can likely get off with HD alone. She denies CP or dyspnea, although she is wearing nasal O2 this AM.  Objective Vitals:   12/06/22 0022 12/06/22 0419 12/06/22 0750 12/06/22 0800  BP: (!) 156/73 (!) 156/55 (!) 144/54 (!) 160/61  Pulse: 70 74 71 76  Resp: 18 18 20  (!) 21  Temp: 97.7 F (36.5 C) 98.1 F (36.7 C) 98.1 F (36.7 C)   TempSrc: Oral Oral    SpO2: 94% 94% 98% 96%  Weight:  39 kg    Height:       Physical Exam General: Petite woman, NAD. Nasal O2 in place Heart: RRR; no murmur Lungs: CTAB Abdomen: distended, but soft, non-tender Extremities: no LE edema Dialysis Access: [name] AVF + bruit  Additional Objective Labs: Basic Metabolic Panel: Recent Labs  Lab 12/04/22 1425 12/04/22 2030 12/05/22 0547 12/06/22 0031  NA 134*  --  134* 133*  K 3.9  --  3.5 3.6  CL 91*  --  95* 92*  CO2 30  --  24 28  GLUCOSE 99  --  68* 105*  BUN 19  --  5* 16  CREATININE 2.93*  --  1.36* 3.00*  CALCIUM 8.7*  --  8.9 8.9  PHOS  --  4.8*  --   --    Liver Function Tests: Recent Labs  Lab 12/04/22 1425  AST 28  ALT 11  ALKPHOS 82  BILITOT 0.8  PROT 6.4*  ALBUMIN 3.3*   CBC: Recent Labs  Lab 12/04/22 1425  WBC 5.5  NEUTROABS 3.8  HGB 9.6*  HCT 29.7*  MCV 96.4  PLT 149*   Studies/Results: IR ABDOMEN US LIMITED  Result Date: 12/05/2022 INDICATION: 62 year old female. History of end-stage renal disease on hemodialysis with thrombocytopenia presented to the emergency room in fluid overload. Request is for therapeutic and diagnostic paracentesis. EXAM: ULTRASOUND ABDOMEN LIMITED FINDINGS: Imaging of all 4 quadrants of the abdomen reveal no ascites IMPRESSION: No ascites seen in ultrasound.  No need for paracentesis. Read by:  Anders Grant, NP Electronically Signed   By: Gilmer Mor D.O.   On: 12/05/2022 12:58   US AORTA  Result Date: 12/04/2022 CLINICAL DATA:  Abdominal pain EXAM: ULTRASOUND OF ABDOMINAL AORTA TECHNIQUE: Ultrasound examination of the abdominal aorta and proximal common iliac arteries was performed to evaluate for aneurysm. Additional color and Doppler images of the distal aorta were obtained to document patency. COMPARISON:  CT from earlier in the same day. FINDINGS: Abdominal aortic measurements as follows: Proximal:  1.9 cm Mid:  1.0 cm Distal: Not well visualized Right common iliac artery: Not well visualized Left common iliac artery: Not well visualized IMPRESSION: Extremely limited exam. No evidence of abdominal aortic aneurysm is seen. These findings correspond with that noted on recent CT examination from 1 hour previous Electronically Signed   By: Alcide Clever M.D.   On: 12/04/2022 20:11   CT ABDOMEN PELVIS WO CONTRAST  Result Date: 12/04/2022 CLINICAL DATA:  Abdominal pain. EXAM: CT ABDOMEN AND PELVIS WITHOUT CONTRAST TECHNIQUE: Multidetector CT imaging of the abdomen and pelvis was performed following the standard protocol without IV contrast. RADIATION DOSE REDUCTION: This exam was performed according to the departmental dose-optimization program which includes automated exposure control, adjustment  of the mA and/or kV according to patient size and/or use of iterative reconstruction technique. COMPARISON:  January 25, 2021 FINDINGS: Lower chest: Mild-to-moderate severity compressive atelectasis is seen within the posterior aspect of the right lower lobe. A small to moderate size right pleural effusion is present. Hepatobiliary: No focal liver abnormality is seen. The gallbladder appears to be contracted and contains a 10 mm gallstone. This represents a new finding when compared to the prior study. Pancreas: Unremarkable. No pancreatic ductal dilatation or surrounding inflammatory changes. Spleen:  Normal in size without focal abnormality. Adrenals/Urinary Tract: Adrenal glands are unremarkable. Kidneys are normal in size, without obstructing renal calculi, focal lesion, or hydronephrosis. 2 mm nonobstructing renal calculi are seen within the mid and lower left kidney. These are present on the prior study. Persistent, nonspecific marked severity bilateral perinephric inflammatory fat stranding is seen. Bladder is unremarkable. Stomach/Bowel: Stomach is within normal limits. Appendix appears normal. No evidence of bowel wall thickening, distention, or inflammatory changes. Vascular/Lymphatic: Aortic atherosclerosis. No enlarged abdominal or pelvic lymph nodes. Reproductive: Uterus and bilateral adnexa are unremarkable. Other: Mild anasarca is seen throughout the anterior and lateral abdominal walls. There is moderate to marked severity abdominopelvic ascites. Musculoskeletal: Chronic fracture deformities are seen involving the bilateral inferior pubic rami. This represents a new finding when compared to the prior study. Multiple chronic bilateral rib fractures are noted. No acute or significant osseous findings. IMPRESSION: 1. Small to moderate size right pleural effusion with mild-to-moderate severity right lower lobe compressive atelectasis. 2. Cholelithiasis. 3. 2 mm nonobstructing left renal calculi. 4. Moderate to marked severity abdominopelvic ascites. 5. Mild anasarca. 6. Aortic atherosclerosis. Aortic Atherosclerosis (ICD10-I70.0). Electronically Signed   By: Aram Candela M.D.   On: 12/04/2022 19:53   DG Chest Port 1 View  Result Date: 12/04/2022 CLINICAL DATA:  Shortness of breath EXAM: PORTABLE CHEST 1 VIEW COMPARISON:  Chest x-ray dated August 03, 2022 FINDINGS: Unchanged cardiomegaly. Lungs are clear. Probable small right pleural effusion, overlying leads somewhat limits evaluation. No evidence of pneumothorax. IMPRESSION: Probable small right pleural effusion. Electronically Signed   By:  Allegra Lai M.D.   On: 12/04/2022 15:11   Medications:  sodium chloride      acidophilus  2 capsule Oral BID   amLODipine  10 mg Oral Daily   aspirin  81 mg Oral Daily   atorvastatin  40 mg Oral QHS   carvedilol  6.25 mg Oral BID WC   Chlorhexidine Gluconate Cloth  6 each Topical Q0600   darbepoetin (ARANESP) injection - DIALYSIS  100 mcg Subcutaneous Q Sat-1800   FLUoxetine  10 mg Oral Daily   heparin  5,000 Units Subcutaneous Q12H   levothyroxine  88 mcg Oral QAC breakfast   loratadine  10 mg Oral Daily   mirtazapine  7.5 mg Oral QHS   pantoprazole  40 mg Oral Daily   sevelamer carbonate  800 mg Oral TID WC   sodium chloride flush  3 mL Intravenous Q12H    Dialysis Orders:  [facility]  3h  350/500   33kg  2/2 bath  Heparin none  AVF - last OP HD was Tuesday, came off 6kg up - presented to HD today 9kg up, unable to pull more that 2 L it looks for the last 2 wks - Mircera 100 q 2 weeks - last 6/1   Assessment/ Plan: Volume overload: CT with R pleural effusions and ascites. Ongoing large gains and only able to get off 2L q HD  d/t recommended max UF and her low body weight. She did tolerate 3L off with HD 6/13, for HD again now and will speak with her OP unit about trying to ^ UFG temporarily until she gets back to goal. Not enough ascites for safe paracentesis on 6/14. Will also need to reiterate fluids restrictions to her SNF. She also has significant tricuspid regurgitation. Her symptoms are improved today.  ESRD: Continue HD on TTS schedule - HD now, 3L UFG again. BUN/Cr are falsely low due to her lack of muscle mass. HTN/volume: See above, UF as tolerated. BP high. Anemia of ESRD: Hb 9.6, ESA last given 6/1 as OP - due today - have ordered. Bone/Mineral: Ca/Phos to goal, continue binders. Hx CVA  Lily Peer 12/06/2022, 8:34 AM  [employer]

## 2022-12-07 DIAGNOSIS — R188 Other ascites: Secondary | ICD-10-CM | POA: Diagnosis not present

## 2022-12-07 MED ORDER — AMLODIPINE BESYLATE 10 MG PO TABS: 10.0000 mg | ORAL_TABLET | Freq: Every day | ORAL | Status: DC

## 2022-12-07 MED ORDER — CARVEDILOL 12.5 MG PO TABS: 12.5000 mg | ORAL_TABLET | Freq: Two times a day (BID) | ORAL | 1 refills | Status: DC

## 2022-12-07 MED ORDER — AMLODIPINE BESYLATE 10 MG PO TABS: 10.0000 mg | ORAL_TABLET | Freq: Every day | ORAL | 1 refills | Status: AC

## 2022-12-07 MED ORDER — CARVEDILOL 12.5 MG PO TABS: 12.5000 mg | ORAL_TABLET | Freq: Two times a day (BID) | ORAL | Status: DC

## 2022-12-07 NOTE — Progress Notes (Signed)
PT's assisted living center called to ascertain when PT may be arriving. Spoke with RN who normally takes care of her. Gave report and told her Likely a post 5PM pick up by [facility]. Alll questions were answered.

## 2022-12-07 NOTE — Progress Notes (Signed)
Discharged patient to [facility] by [facility]

## 2022-12-07 NOTE — Progress Notes (Signed)
[facility] Progress Note   Subjective:  Seen in room - still on O2, but she reports improved breathing. No chest or abdominal pain. Per hospitalist - may be able to discharge today, unclear at this time.  Objective Vitals:   12/06/22 2100 12/07/22 0032 12/07/22 0414 12/07/22 0851  BP: (!) 138/59 (!) 132/58 (!) 136/48   Pulse: 68 62 62   Resp: (!) 21 14 14 14   Temp: 98.3 F (36.8 C) 98.3 F (36.8 C) 98.3 F (36.8 C)   TempSrc: Oral Oral Oral   SpO2: 96% 94%  98%  Weight:   37.5 kg   Height:       Physical Exam General: Petite woman, NAD. Nasal O2 in place Heart: RRR; 2/6 murmur Lungs: CTAB Abdomen: distended, but soft, non-tender Extremities: no LE edema Dialysis Access: [name] AVF + bruit  Additional Objective Labs: Basic Metabolic Panel: Recent Labs  Lab 12/04/22 1425 12/04/22 2030 12/05/22 0547 12/06/22 0031  NA 134*  --  134* 133*  K 3.9  --  3.5 3.6  CL 91*  --  95* 92*  CO2 30  --  24 28  GLUCOSE 99  --  68* 105*  BUN 19  --  5* 16  CREATININE 2.93*  --  1.36* 3.00*  CALCIUM 8.7*  --  8.9 8.9  PHOS  --  4.8*  --   --    Liver Function Tests: Recent Labs  Lab 12/04/22 1425  AST 28  ALT 11  ALKPHOS 82  BILITOT 0.8  PROT 6.4*  ALBUMIN 3.3*   CBC: Recent Labs  Lab 12/04/22 1425  WBC 5.5  NEUTROABS 3.8  HGB 9.6*  HCT 29.7*  MCV 96.4  PLT 149*   Studies/Results: DG CHEST PORT 1 VIEW  Result Date: 12/06/2022 CLINICAL DATA:  [id number] SOB (shortness of breath) [id number] EXAM: PORTABLE CHEST 1 VIEW COMPARISON:  December 04, 2022 FINDINGS: The cardiomediastinal silhouette is unchanged and enlarged in contour.Atherosclerotic calcifications of the aorta. Small RIGHT pleural effusion. No pneumothorax. LEFT-sided linear opacities, likely atelectasis. Remote RIGHT-sided rib fractures. IMPRESSION: Small RIGHT pleural effusion. Electronically Signed   By: Meda Klinefelter M.D.   On: 12/06/2022 17:34   IR ABDOMEN US LIMITED  Result Date:  12/05/2022 INDICATION: 63 year old female. History of end-stage renal disease on hemodialysis with thrombocytopenia presented to the emergency room in fluid overload. Request is for therapeutic and diagnostic paracentesis. EXAM: ULTRASOUND ABDOMEN LIMITED FINDINGS: Imaging of all 4 quadrants of the abdomen reveal no ascites IMPRESSION: No ascites seen in ultrasound.  No need for paracentesis. Read by: Anders Grant, NP Electronically Signed   By: Gilmer Mor D.O.   On: 12/05/2022 12:58    Medications:  sodium chloride      acidophilus  2 capsule Oral BID   amLODipine  10 mg Oral Daily   aspirin  81 mg Oral Daily   atorvastatin  40 mg Oral QHS   carvedilol  12.5 mg Oral BID WC   Chlorhexidine Gluconate Cloth  6 each Topical Q0600   darbepoetin (ARANESP) injection - DIALYSIS  100 mcg Subcutaneous Q Sat-1800   FLUoxetine  10 mg Oral Daily   heparin  5,000 Units Subcutaneous Q12H   levothyroxine  88 mcg Oral QAC breakfast   loratadine  10 mg Oral Daily   mirtazapine  7.5 mg Oral QHS   pantoprazole  40 mg Oral Daily   sevelamer carbonate  800 mg Oral TID WC   sodium chloride flush  3 mL Intravenous Q12H    Dialysis Orders: [facility]  3h  350/500   33kg  2/2 bath  Heparin none  AVF - last OP HD was Tuesday, came off 6kg up - presented to HD today 9kg up, unable to pull more that 2 L it looks for the last 2 wks - Mircera 100 q 2 weeks - last 6/1   Assessment/ Plan: Volume overload: CT with R pleural effusions and ascites. Ongoing large gains and only able to get off 2L q HD d/t recommended max UF and her low body weight. She did tolerate 3L off with HD 6/13 and 6/14. Not enough ascites for safe paracentesis on 6/14. Will also need to reiterate fluids restrictions to her SNF - discussed this with patient as well - to cut back on eating ice. She also has significant tricuspid regurgitation. Her symptoms are improved today, although she is still not back to prior EDW. If still here  tomorrow, will plan an extra HD tomorrow - however, this does not need to hold up discharge as this can be done as outpatient as well. ESRD: Continue HD on TTS schedule. BUN/Cr are falsely low due to her lack of muscle mass. For extra HD tomorrow if still here, but if discharged today, will arrange extra HD as outpatient. HTN/volume: See above, UF as tolerated. BP much improved. Anemia of ESRD: Hb 9.6, Aranesp given 6/15. Bone/Mineral: Ca/Phos to goal, continue binders. Hx CVA  Lily Peer 12/07/2022, 9:22 AM  [employer]

## 2022-12-07 NOTE — Plan of Care (Signed)
[facility] Patient Discharge Orders - [facility] CLINIC: [facility] **updated d/c  Patient's name: [name] Michael Admit/DC Dates: 12/04/2022 - 12/07/22  DISCHARGE DIAGNOSES: Volume overload (with B effusions + ascites) - s/p serial HD, closer to EDW now  Attempted paracentesis 6/14 - not enough fluid to tap  HD ORDER CHANGES: Heparin change: no EDW Change: no - keep at 33 kg. Got down around 36 kg here - can go more.  Bath Change: no  ANEMIA MANAGEMENT: Aranesp: Given: yes - Aranesp given 6/15 ESA dose for discharge: Mircera 100 mcg IV q 2 weeks, to start on 6/22 (same) IV Iron dose at discharge: per protocol Transfusion: Given: no  BONE/MINERAL MEDICATIONS: Hectorol/Calcitriol change: no Sensipar/Parsabiv change: no  ACCESS INTERVENTION/CHANGE: no Details:   RECENT LABS: Recent Labs  Lab 12/04/22 1425 12/04/22 2030 12/05/22 0547 12/06/22 0031  HGB 9.6*  --   --   --   NA 134*  --    < > 133*  K 3.9  --    < > 3.6  CALCIUM 8.7*  --    < > 8.9  PHOS  --  4.8*  --   --   ALBUMIN 3.3*  --   --   --    < > = values in this interval not displayed.    IV ANTIBIOTICS: no Details:  OTHER ANTICOAGULATION: On Coumadin?: no  OTHER/APPTS/LAB ORDERS:  1) Aim for 3L UFG q HD until reaching EDW  - she can pull it. Trying to keep her out of hospital. Pls call SNF and remind of her 1L/d max fluid restrictions - she tells me had been eating a lot of ice  D/C Meds to be reconciled by nurse after every discharge.  Completed By:   Reviewed by: MD:______ RN_______

## 2022-12-07 NOTE — TOC Transition Note (Signed)
Transition of Care [facility]) - CM/SW Discharge Note  Patient Details  Name: [name] Michael MRN: [id number] Date of Birth: 1961-06-16  [facility]) CM/SW Contact:  Helene Kelp, LCSW Phone Number: 12/07/2022, 3:58 PM   Clinical Narrative:    CSW was contacted by clinical team members to support the patient's disposition (discharge backed to SNF- [facility]).   This Clinical research associate made contact with [employer] admissions team rep: Pam to provide update patient ready to discharge and ready to return to the facility. Pam provided this Clinical research associate with the nurse report number# [phone].  CSW arranged transport to [employer] with [facility]. ETA 5:15PM to transport pt to the facility.  D/C packet and process completed by this Clinical research associate.   This Clinical research associate updated the clinical team via CH-messages.  No other needs identified or requested.If needs do arise, please feel to contact TOC for support.     Final next level of care: Skilled Nursing Facility [facility]) Barriers to Discharge: No Barriers Identified, Continued Medical Work up   Patient Goals and CMS Choice CMS Medicare.gov Compare Post Acute Care list provided to:: Patient Choice offered to / list presented to : Patient  Discharge Placement                Patient chooses bed at: [facility] Patient to be transferred to facility by: [facility]   Patient and family notified of of transfer: 12/07/22  Discharge Plan and Services Additional resources added to the After Visit Summary for                                       Social Determinants of Health (SDOH) Interventions SDOH Screenings   Food Insecurity: No Food Insecurity (12/04/2022)  Housing: Low Risk  (12/04/2022)  Transportation Needs: No Transportation Needs (12/04/2022)  Utilities: Not At Risk (12/04/2022)  Depression (PHQ2-9): Low Risk  (05/23/2019)  Tobacco Use: High Risk (12/04/2022)      Readmission Risk Interventions    02/21/2021   10:09 AM 01/31/2021    3:06 PM  Readmission Risk Prevention Plan  Transportation Screening Complete Complete  Medication Review (RN Care Manager) Referral to Pharmacy Referral to Pharmacy  PCP or Specialist appointment within 3-5 days of discharge Complete Complete  HRI or Home Care Consult Complete Complete  SW Recovery Care/Counseling Consult Complete Complete  Palliative Care Screening Not Applicable Not Applicable  Skilled Nursing Facility Complete Complete

## 2022-12-07 NOTE — Discharge Summary (Addendum)
Physician Discharge Summary  [name] Michael ZOX:[id number] DOB: 1960/08/03 DOA: 12/04/2022  PCP: Renford Dills, MD  Admit date: 12/04/2022 Discharge date: 12/07/2022  Admitted From: Home Disposition:  Home  Discharge Condition:Stable CODE STATUS: DNR Diet recommendation: Renal diet  Brief/Interim Summary: Patient is a 62 year old female with history of ESRD on dialysis on TTS schedule, chronic diastolic CHF, hypothyroidism, insulin-dependent diabetes, anemia of chronic disease, GERD, hyperlipidemia who presented with abdominal distention, pain with associated nausea, weight gain.  On presentation, she was found to be hypertensive, hypoxic requiring 2 L of oxygen.  X-ray showed pulmonary congestion with a small right-sided pleural effusion.  Nephrology consulted for dialysis.  Hospital course remarkable for congestion likely from volume overload.  She feels much better today.  Medically stable for discharge.  Following problems were addressed during the hospitalization:  Abdominal pain, distention from ascites: Presented with abdominal distention, pain, weight gain.  Likely associated with fluid overload from ESRD.  CT abdomen/pelvis showed moderate to severe ascites, small to moderate right-sided pleural effusion.  No focal abnormality in the liver as per CT abdomen/pelvis.  Shows 10 mm gallstone which needs to be monitored as an outpatient.  Ultrasound of the aorta did not show any abdominal aortic aneurysm. Radiology attempted paracentesis but found to have only trace amount so paracentesis not done.  Abdominal pain has resolved.   ESRD on dialysis: Dialyzed on TTS schedule.  Nephrology already following.  Underwent dialysis.  Continue phosphate binders.   Acute on chronic diastolic CHF: Volume management as per dialysis.  This could also be contributing to ascites.  No significant peripheral edema.  Chest x-ray on presentation showed pulmonary congestion.Elevated BNP.   Acute hypoxic  respiratory failure: Secondary to ESRD causing volume overload, diastolic CHF.  Required some oxygen here at 1 to 2 L.  Denies shortness of breath or cough.  Lungs are clear to auscultation except for diminished air sounds in the bases.  Can wean the oxygen gradually at nursing facility   Uncontrolled hypertension: Continue Coreg, amlodipine.  Dose adjusted  Hypothyroidism: Continue Synthroid      Discharge Diagnoses:  Active Problems:   ESRD on hemodialysis (HCC)   Acute on chronic diastolic CHF (congestive heart failure) (HCC)   Type II diabetes mellitus (HCC)   Abdominal ascites   CHF (congestive heart failure) [facility])    Discharge Instructions  Discharge Instructions     Diet - low sodium heart healthy   Complete by: As directed    Discharge instructions   Complete by: As directed    1)Please take your medications as instructed   Increase activity slowly   Complete by: As directed       Allergies as of 12/07/2022       Reactions   Hydrocodone Nausea And Vomiting, Other (See Comments)   "Allergic," per [facility]        Medication List     TAKE these medications    acetaminophen 500 MG tablet Commonly known as: TYLENOL Take 500 mg by mouth every 6 (six) hours as needed (for pain).   albuterol 108 (90 Base) MCG/ACT inhaler Commonly known as: VENTOLIN HFA Inhale 1 puff into the lungs every 6 (six) hours as needed for wheezing or shortness of breath.   amLODipine 10 MG tablet Commonly known as: NORVASC Take 1 tablet (10 mg total) by mouth daily. Start taking on: December 08, 2022   aspirin 81 MG chewable tablet Chew 81 mg by mouth daily.   atorvastatin 40 MG tablet  Commonly known as: LIPITOR Take 40 mg by mouth at bedtime.   b complex-vitamin c-folic acid 0.8 MG Tabs tablet Take 1 tablet by mouth at bedtime.   carvedilol 12.5 MG tablet Commonly known as: COREG Take 1 tablet (12.5 mg total) by mouth 2 (two) times daily with a meal. What changed:  medication  strength how much to take   chlorhexidine 0.12 % solution Commonly known as: Peridex Use as directed 15 mLs in the mouth or throat 2 (two) times daily.   cloNIDine 0.1 MG tablet Commonly known as: CATAPRES Take 0.1 mg by mouth every 8 (eight) hours as needed (for a B/P reading greater than 160/90).   fish oil-omega-3 fatty acids 1000 MG capsule Take 1 g by mouth in the morning and at bedtime.   levothyroxine 100 MCG tablet Commonly known as: SYNTHROID Take 100 mcg by mouth daily before breakfast.   midodrine 5 MG tablet Commonly known as: PROAMATINE Take 5 mg by mouth See admin instructions. Take 5 mg by mouth in the morning as directed on Tues/Thurs/Sat   omeprazole 20 MG capsule Commonly known as: PRILOSEC Take 20 mg by mouth in the morning.   OXYGEN Inhale 2 L/min into the lungs as needed (for a saturation less than 90%).   senna-docusate 8.6-50 MG tablet Commonly known as: Senokot-S Take 1 tablet by mouth at bedtime as needed for mild constipation.   sevelamer carbonate 800 MG tablet Commonly known as: RENVELA Take 800 mg by mouth 3 (three) times daily with meals.        Allergies  Allergen Reactions   Hydrocodone Nausea And Vomiting and Other (See Comments)    "Allergic," per [facility]    Consultations: Nephrology   Procedures/Studies: DG CHEST PORT 1 VIEW  Result Date: 12/06/2022 CLINICAL DATA:  141880 SOB (shortness of breath) [id number] EXAM: PORTABLE CHEST 1 VIEW COMPARISON:  December 04, 2022 FINDINGS: The cardiomediastinal silhouette is unchanged and enlarged in contour.Atherosclerotic calcifications of the aorta. Small RIGHT pleural effusion. No pneumothorax. LEFT-sided linear opacities, likely atelectasis. Remote RIGHT-sided rib fractures. IMPRESSION: Small RIGHT pleural effusion. Electronically Signed   By: Meda Klinefelter M.D.   On: 12/06/2022 17:34   IR ABDOMEN US LIMITED  Result Date: 12/05/2022 INDICATION: 62 year old female. History of end-stage renal  disease on hemodialysis with thrombocytopenia presented to the emergency room in fluid overload. Request is for therapeutic and diagnostic paracentesis. EXAM: ULTRASOUND ABDOMEN LIMITED FINDINGS: Imaging of all 4 quadrants of the abdomen reveal no ascites IMPRESSION: No ascites seen in ultrasound.  No need for paracentesis. Read by: Anders Grant, NP Electronically Signed   By: Gilmer Mor D.O.   On: 12/05/2022 12:58   US AORTA  Result Date: 12/04/2022 CLINICAL DATA:  Abdominal pain EXAM: ULTRASOUND OF ABDOMINAL AORTA TECHNIQUE: Ultrasound examination of the abdominal aorta and proximal common iliac arteries was performed to evaluate for aneurysm. Additional color and Doppler images of the distal aorta were obtained to document patency. COMPARISON:  CT from earlier in the same day. FINDINGS: Abdominal aortic measurements as follows: Proximal:  1.9 cm Mid:  1.0 cm Distal: Not well visualized Right common iliac artery: Not well visualized Left common iliac artery: Not well visualized IMPRESSION: Extremely limited exam. No evidence of abdominal aortic aneurysm is seen. These findings correspond with that noted on recent CT examination from 1 hour previous Electronically Signed   By: Alcide Clever M.D.   On: 12/04/2022 20:11   CT ABDOMEN PELVIS WO CONTRAST  Result Date: 12/04/2022 CLINICAL  DATA:  Abdominal pain. EXAM: CT ABDOMEN AND PELVIS WITHOUT CONTRAST TECHNIQUE: Multidetector CT imaging of the abdomen and pelvis was performed following the standard protocol without IV contrast. RADIATION DOSE REDUCTION: This exam was performed according to the departmental dose-optimization program which includes automated exposure control, adjustment of the mA and/or kV according to patient size and/or use of iterative reconstruction technique. COMPARISON:  January 25, 2021 FINDINGS: Lower chest: Mild-to-moderate severity compressive atelectasis is seen within the posterior aspect of the right lower lobe. A small to  moderate size right pleural effusion is present. Hepatobiliary: No focal liver abnormality is seen. The gallbladder appears to be contracted and contains a 10 mm gallstone. This represents a new finding when compared to the prior study. Pancreas: Unremarkable. No pancreatic ductal dilatation or surrounding inflammatory changes. Spleen: Normal in size without focal abnormality. Adrenals/Urinary Tract: Adrenal glands are unremarkable. Kidneys are normal in size, without obstructing renal calculi, focal lesion, or hydronephrosis. 2 mm nonobstructing renal calculi are seen within the mid and lower left kidney. These are present on the prior study. Persistent, nonspecific marked severity bilateral perinephric inflammatory fat stranding is seen. Bladder is unremarkable. Stomach/Bowel: Stomach is within normal limits. Appendix appears normal. No evidence of bowel wall thickening, distention, or inflammatory changes. Vascular/Lymphatic: Aortic atherosclerosis. No enlarged abdominal or pelvic lymph nodes. Reproductive: Uterus and bilateral adnexa are unremarkable. Other: Mild anasarca is seen throughout the anterior and lateral abdominal walls. There is moderate to marked severity abdominopelvic ascites. Musculoskeletal: Chronic fracture deformities are seen involving the bilateral inferior pubic rami. This represents a new finding when compared to the prior study. Multiple chronic bilateral rib fractures are noted. No acute or significant osseous findings. IMPRESSION: 1. Small to moderate size right pleural effusion with mild-to-moderate severity right lower lobe compressive atelectasis. 2. Cholelithiasis. 3. 2 mm nonobstructing left renal calculi. 4. Moderate to marked severity abdominopelvic ascites. 5. Mild anasarca. 6. Aortic atherosclerosis. Aortic Atherosclerosis (ICD10-I70.0). Electronically Signed   By: Aram Candela M.D.   On: 12/04/2022 19:53   DG Chest Port 1 View  Result Date: 12/04/2022 CLINICAL DATA:   Shortness of breath EXAM: PORTABLE CHEST 1 VIEW COMPARISON:  Chest x-ray dated August 03, 2022 FINDINGS: Unchanged cardiomegaly. Lungs are clear. Probable small right pleural effusion, overlying leads somewhat limits evaluation. No evidence of pneumothorax. IMPRESSION: Probable small right pleural effusion. Electronically Signed   By: Allegra Lai M.D.   On: 12/04/2022 15:11   CT Head Wo Contrast  Result Date: 11/29/2022 CLINICAL DATA:  Plain facial trauma due to fall. EXAM: CT HEAD WITHOUT CONTRAST CT MAXILLOFACIAL WITHOUT CONTRAST CT CERVICAL SPINE WITHOUT CONTRAST TECHNIQUE: Multidetector CT imaging of the head, cervical spine, and maxillofacial structures were performed using the standard protocol without intravenous contrast. Multiplanar CT image reconstructions of the cervical spine and maxillofacial structures were also generated. RADIATION DOSE REDUCTION: This exam was performed according to the departmental dose-optimization program which includes automated exposure control, adjustment of the mA and/or kV according to patient size and/or use of iterative reconstruction technique. COMPARISON:  08/03/2022 head and cervical spine CT FINDINGS: CT HEAD FINDINGS Brain: No evidence of acute infarction, hemorrhage, hydrocephalus, extra-axial collection or mass lesion/mass effect. 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 Vascular: No hyperdense vessel or unexpected calcification. Skull: No evidence of injury CT MAXILLOFACIAL FINDINGS Osseous: No acute fracture.  Negative for mandibular dislocation. Orbits: Rounded high-density area in the lower globe at the level of the vitreous, measuring 4 mm Sinuses:  Negative for hemosinus Soft tissues: No hematoma. 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 CT CERVICAL SPINE FINDINGS Alignment:  No traumatic malalignment Skull base and vertebrae: No acute fracture. No primary bone lesion or focal pathologic process. Soft tissues and spinal canal: No prevertebral fluid or swelling. No visible canal hematoma. Disc levels: Ordinary degenerative changes without high-grade bony stenosis. Upper chest: No evidence of injury IMPRESSION: 1. No evidence of acute intracranial or cervical spine injury. 2. Negative for facial fracture. 3. 4 mm high-density area newly seen in the left globe, correlate with fundoscopy. Electronically Signed   By: Tiburcio Pea M.D.   On: 11/29/2022 04:17   CT Maxillofacial Wo Contrast  Result Date: 11/29/2022 CLINICAL DATA:  Plain facial trauma due to fall. EXAM: CT HEAD WITHOUT CONTRAST CT MAXILLOFACIAL WITHOUT CONTRAST CT CERVICAL SPINE WITHOUT CONTRAST TECHNIQUE: Multidetector CT imaging of the head, cervical spine, and maxillofacial structures were performed using the standard protocol without intravenous contrast. Multiplanar CT image reconstructions of the cervical spine and maxillofacial structures were also generated. RADIATION DOSE REDUCTION: This exam was performed according to the departmental dose-optimization program which includes automated exposure control, adjustment of the mA and/or kV according to patient size and/or use of iterative reconstruction technique. COMPARISON:  08/03/2022 head and cervical spine CT FINDINGS: CT HEAD FINDINGS Brain: No evidence of acute infarction, hemorrhage, hydrocephalus, extra-axial collection or mass lesion/mass effect. 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 Vascular: No hyperdense vessel or unexpected calcification. Skull: No evidence of injury CT MAXILLOFACIAL FINDINGS Osseous: No acute fracture.  Negative for mandibular dislocation. Orbits: Rounded high-density area in the lower globe at the level of the vitreous, measuring 4 mm Sinuses: Negative for hemosinus Soft tissues: No hematoma.  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 CT CERVICAL SPINE FINDINGS Alignment: No traumatic malalignment Skull base and vertebrae: No acute fracture. No primary bone lesion or focal pathologic process. Soft tissues and spinal canal: No prevertebral fluid or swelling. No visible canal hematoma. Disc levels: Ordinary degenerative changes without high-grade bony stenosis. Upper chest: No evidence of injury IMPRESSION: 1. No evidence of acute intracranial or cervical spine injury. 2. Negative for facial fracture. 3. 4 mm high-density area newly seen in the left globe, correlate with fundoscopy. Electronically Signed   By: Tiburcio Pea M.D.   On: 11/29/2022 04:17   CT Cervical Spine Wo Contrast  Result Date: 11/29/2022 CLINICAL DATA:  Plain facial trauma due to fall. EXAM: CT HEAD WITHOUT CONTRAST CT MAXILLOFACIAL WITHOUT CONTRAST CT CERVICAL SPINE WITHOUT CONTRAST TECHNIQUE: Multidetector CT imaging of the head, cervical spine, and maxillofacial structures were performed using the standard protocol without intravenous contrast. Multiplanar CT image reconstructions of the cervical spine and maxillofacial structures were also generated. RADIATION DOSE REDUCTION: This exam was performed according to the departmental dose-optimization program which includes automated exposure control, adjustment of the mA and/or kV according to patient size and/or use of iterative reconstruction technique. COMPARISON:  08/03/2022 head and cervical spine CT FINDINGS: CT HEAD FINDINGS Brain: No evidence of acute infarction, hemorrhage, hydrocephalus, extra-axial collection or mass lesion/mass effect. 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 Vascular: No hyperdense vessel or unexpected calcification. Skull: No  evidence of injury CT MAXILLOFACIAL FINDINGS  Osseous: No acute fracture.  Negative for mandibular dislocation. Orbits: Rounded high-density area in the lower globe at the level of the vitreous, measuring 4 mm Sinuses: Negative for hemosinus Soft tissues: No hematoma. 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 CT CERVICAL SPINE FINDINGS Alignment: No traumatic malalignment Skull base and vertebrae: No acute fracture. No primary bone lesion or focal pathologic process. Soft tissues and spinal canal: No prevertebral fluid or swelling. No visible canal hematoma. Disc levels: Ordinary degenerative changes without high-grade bony stenosis. Upper chest: No evidence of injury IMPRESSION: 1. No evidence of acute intracranial or cervical spine injury. 2. Negative for facial fracture. 3. 4 mm high-density area newly seen in the left globe, correlate with fundoscopy. Electronically Signed   By: Tiburcio Pea M.D.   On: 11/29/2022 04:17   DG Knee Complete 4 Views Right  Result Date: 11/29/2022 CLINICAL DATA:  Fall on right knee EXAM: RIGHT KNEE - COMPLETE 4+ VIEW COMPARISON:  Radiographs 10/13/2020 FINDINGS: No evidence of fracture, dislocation, or joint effusion. No evidence of arthropathy or other focal bone abnormality. Soft tissues are unremarkable. IMPRESSION: Negative. Electronically Signed   By: Minerva Fester M.D.   On: 11/29/2022 03:50      Subjective: Patient seen and examined at bedside today.  Hemodynamically stable.  Still looks comfortable today.  Denies any worsening shortness of breath or cough.  No abdominal pain.  Medically stable for discharge.  Discharge Exam: Vitals:   12/07/22 1048 12/07/22 1051  BP:    Pulse: 66 63  Resp: (!) 24 (!) 21  Temp:    SpO2: (!) 86% 92%   Vitals:   12/07/22 0414 12/07/22 0851 12/07/22 1048 12/07/22 1051  BP: (!) 136/48     Pulse: 62  66 63  Resp: 14 14 (!) 24 (!)  21  Temp: 98.3 F (36.8 C)     TempSrc: Oral     SpO2:  98% (!) 86% 92%  Weight: 37.5 kg     Height:        General: Pt is alert, awake, not in acute distress Cardiovascular: RRR, S1/S2 +, no rubs, no gallops, diminished sounds on the bases Respiratory: CTA bilaterally, no wheezing, no rhonchi Abdominal: Soft, NT, ND, bowel sounds + Extremities: no edema, no cyanosis, AV fistula on the left arm    The results of significant diagnostics from this hospitalization (including imaging, microbiology, ancillary and laboratory) are listed below for reference.     Microbiology: No results found for this or any previous visit (from the past 240 hour(s)).   Labs: BNP (last 3 results) Recent Labs    07/18/22 1323 12/04/22 1425  BNP 4,488.6* 3,103.7*   Basic Metabolic Panel: Recent Labs  Lab 12/04/22 1425 12/04/22 2030 12/05/22 0547 12/06/22 0031  NA 134*  --  134* 133*  K 3.9  --  3.5 3.6  CL 91*  --  95* 92*  CO2 30  --  24 28  GLUCOSE 99  --  68* 105*  BUN 19  --  5* 16  CREATININE 2.93*  --  1.36* 3.00*  CALCIUM 8.7*  --  8.9 8.9  PHOS  --  4.8*  --   --    Liver Function Tests: Recent Labs  Lab 12/04/22 1425  AST 28  ALT 11  ALKPHOS 82  BILITOT 0.8  PROT 6.4*  ALBUMIN 3.3*   No results for input(s): "LIPASE", "AMYLASE" in the last 168 hours. No results for input(s): "AMMONIA" in the last 168 hours. CBC: Recent Labs  Lab 12/04/22 1425  WBC 5.5  NEUTROABS 3.8  HGB 9.6*  HCT 29.7*  MCV 96.4  PLT 149*   Cardiac Enzymes: No results for input(s): "CKTOTAL", "CKMB", "CKMBINDEX", "TROPONINI" in the last 168 hours. BNP: Invalid input(s): "POCBNP" CBG: No results for input(s): "GLUCAP" in the last 168 hours. D-Dimer No results for input(s): "DDIMER" in the last 72 hours. Hgb A1c No results for input(s): "HGBA1C" in the last 72 hours. Lipid Profile No results for input(s): "CHOL", "HDL", "LDLCALC", "TRIG", "CHOLHDL", "LDLDIRECT" in the last 72  hours. Thyroid function studies No results for input(s): "TSH", "T4TOTAL", "T3FREE", "THYROIDAB" in the last 72 hours.  Invalid input(s): "FREET3" Anemia work up No results for input(s): "VITAMINB12", "FOLATE", "FERRITIN", "TIBC", "IRON", "RETICCTPCT" in the last 72 hours. Urinalysis    Component Value Date/Time   COLORURINE YELLOW 02/17/2021 0322   APPEARANCEUR HAZY (A) 02/17/2021 0322   LABSPEC 1.011 02/17/2021 0322   PHURINE 8.0 02/17/2021 0322   GLUCOSEU 50 (A) 02/17/2021 0322   HGBUR NEGATIVE 02/17/2021 0322   BILIRUBINUR NEGATIVE 02/17/2021 0322   BILIRUBINUR n 05/25/2012 1202   KETONESUR 5 (A) 02/17/2021 0322   PROTEINUR 100 (A) 02/17/2021 0322   UROBILINOGEN 0.2 05/25/2012 1202   UROBILINOGEN 0.2 03/29/2011 0131   NITRITE NEGATIVE 02/17/2021 0322   LEUKOCYTESUR SMALL (A) 02/17/2021 0322   Sepsis Labs Recent Labs  Lab 12/04/22 1425  WBC 5.5   Microbiology No results found for this or any previous visit (from the past 240 hour(s)).  Please note: You were cared for by a hospitalist during your hospital stay. Once you are discharged, your primary care physician will handle any further medical issues. Please note that NO REFILLS for any discharge medications will be authorized once you are discharged, as it is imperative that you return to your primary care physician (or establish a relationship with a primary care physician if you do not have one) for your post hospital discharge needs so that they can reassess your need for medications and monitor your lab values.    Time coordinating discharge: 40 minutes  SIGNED:   Burnadette Pop, MD  [facility] 12/07/2022, 11:08 AM Pager [phone]  If 7PM-7AM, please contact night-coverage [email or web address] Password [email or web address]

## 2022-12-08 NOTE — Progress Notes (Signed)
Late Note Entry  Pt was d/c back to sn[facility] yesterday. Contacted [facility] this morning to advise clinic of pt's d/c date and that pt should resume care tomorrow.   Olivia Canter Renal Navigator [phone]

## 2022-12-10 ENCOUNTER — Non-Acute Institutional Stay: Payer: Medicare Other | Admitting: Hospice

## 2022-12-10 DIAGNOSIS — I10 Essential (primary) hypertension: Secondary | ICD-10-CM

## 2022-12-10 DIAGNOSIS — Z515 Encounter for palliative care: Secondary | ICD-10-CM

## 2022-12-10 DIAGNOSIS — E43 Unspecified severe protein-calorie malnutrition: Secondary | ICD-10-CM

## 2022-12-10 DIAGNOSIS — E440 Moderate protein-calorie malnutrition: Secondary | ICD-10-CM

## 2022-12-10 DIAGNOSIS — N186 End stage renal disease: Secondary | ICD-10-CM

## 2022-12-10 NOTE — Progress Notes (Addendum)
Therapist, nutritional Palliative Care Consult Note Telephone: [phone]  Fax: [phone]  PATIENT NAME: [name] Michael [address] [place] [address] [phone] (home)  DOB: 1961/06/07 MRN: [id number]  PRIMARY CARE PROVIDER:    Dr. Glean Salen  REFERRING PROVIDER:   Renford Dills, MD [address] [place],  [place] [address] [phone]  RESPONSIBLE PARTY:    Contact Information     Name Relation Home Work Mobile   Vincent Sister [phone]     Jackie Plum Niece [phone]  [phone]        I met face to face with patient in the facility. Visit to build trust and highlight Palliative Medicine as specialized medical care for people living with serious illness, aimed at facilitating better quality of life through symptoms relief.  Palliative was asked to see patient for advance care planning and complex medical decision making.  Follow-up visit s/p hospitalization  Palliative care team will continue to support patient, patient's family, and medical team. ASSESSMENT AND / RECOMMENDATIONS:   CODE STATUS: DO NOT RESUSCITATE  Goals of Care: Goals include to maximize quality of life and symptom management  Symptom Management/Plan Patient is frail, continues to decline in her functional status, with increasing weakness and recent fluid overload despite regular dialysis.  End-stage renal disease: Further decompensation; hospitalized 6/13- 12/07/22 with acute abdominal distention, hypertension, CHF;  x-ray showed pulmonary congestion with a small right-sided pleural effusion. Fluid overload managed with hemodialysis.  Continue Tue Thur Sat dialysis.  Continue to monitor weight closely as ordered. Continue Renvela and renal diet. Adhere to 1500 cc total fluid restriction.  Monitor for edema, ascites, shortness of breath, chest pain.  Hypertension: Continue with adjusted dose for Coreg, amlodipine BP today  139/68. Continue to monitor BP as ordered, monitor for headaches, dizziness, chest pain.   Protein caloric malnutrition: Albumin level 3.90 11/10/22  albumin 3.0 08/03/2022, 3.7 05/19/22.Continue to set up/offer help during meals to ensure adequate oral intake.  Diet-regular diet, mechanical soft.  Continue nova resource renal nutritional drink.    Follow up: Palliative care will continue to follow for complex medical decision making, advance care planning, and clarification of goals. Return 6 weeks or prn. Encouraged to call provider sooner with any concerns.   Family /Caregiver/Community Supports: Patient in SNF for ongoing care  HOSPICE ELIGIBILITY/DIAGNOSIS: Patient will benefit from hospice services when she decides when she decides to stop Dialysis  Chief Complaint: f/u visit  HISTORY OF PRESENT ILLNESS:  [name] Michael is a 62 y.o. year old female  with multiple morbidities requiring close monitoring and with high risk of complications and  mortality: ESRD on dialysis, CVA, CHF, gait disturbance, depression, protein caloric malnutrition. Patient denies pain/discomfort.  Patient endorsed weakness denies shortness of breath History obtained from review of EMR, discussion with primary team, caregiver, family and/or Ms. Lepera.  Review and summarization of Epic records shows history from other than patient. Rest of 10 point ROS asked and negative. Independent interpretation of tests and reviewed as needed, available labs, patient records, imaging, studies and related documents from the EMR.  PHYSICAL EXAM: GEN: Frail, weak  Cardiac: S1 S2 Respiratory: no wheezing, no cough, diminished in the bases GI: soft, ascites nondistended, + BS   MS: ambulatory with rolling walker, unsteady gait , generalized weakness    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 (  HCC) 02/09/2007   Hyperlipidemia  02/09/2007   Essential hypertension 02/09/2007   Retinal detachment 11/17/2012   Abnormal mammogram 12/20/2012   Depression 03/01/2013   Poor social situation 03/09/2013   Proliferative diabetic retinopathy (HCC) 04/29/2016   Hypokalemia 06/11/2016   Uncontrolled type 2 diabetes mellitus with complication    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 associated with diabetes mellitus due to underlying condition (HCC)    Acute on chronic diastolic CHF (congestive heart failure) (HCC)    Acute lower UTI 11/09/2016   Acute encephalopathy 03/10/2018   Hypermagnesemia 03/10/2018   Anemia of chronic disease 06/09/2018   Syncope and collapse 06/09/2018   AMS (altered mental status) 04/28/2019   Hypoxemia 04/28/2019   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   ESRD on hemodialysis (HCC)    GERD (gastroesophageal reflux disease)    Hypertension    Type II diabetes mellitus (HCC)    Severe tricuspid regurgitation 12/04/2019   Acute metabolic encephalopathy 08/21/2020   Altered mental status 08/22/2020   Lactic acidosis 10/05/2020   Transaminitis 10/05/2020   Thrombocytopenia (HCC) 10/05/2020   Acute respiratory failure with hypoxia in setting of RSV 10/05/2020   Lymphocytopenia 10/13/2020   Ear hematoma, right, initial encounter 10/13/2020   Prolonged QT interval 10/13/2020   Abdominal ascites 01/25/2021   Pressure injury of skin 02/17/2021   Acute on chronic respiratory failure with hypoxia (HCC) 02/18/2021   RSV (respiratory syncytial virus pneumonia) 07/18/2022   Hypothyroidism 07/18/2022   Elevated troponin 07/18/2022   Tobacco abuse 07/18/2022   CHF (congestive heart failure) (HCC) 12/04/2022   Resolved Ambulatory Problems    Diagnosis Date Noted   GERD 02/09/2007   Closed fracture of left distal radius 01/28/2016   Hypocalcemia 06/11/2016   Acute kidney injury superimposed on chronic kidney disease (HCC) 06/11/2016   Hypophosphatemia 06/12/2016   Abdominal pain     Colitis    CHF (congestive heart failure) (HCC)    Sepsis secondary to UTI (HCC)    UTI due to extended-spectrum beta lactamase (ESBL) producing Escherichia coli    Acute pyelonephritis    Bacteremia due to Escherichia coli    Colitis, indeterminate    CVA (cerebral vascular accident) (HCC) 07/03/2016   HAP (hospital-acquired pneumonia) 07/03/2016   Metabolic acidosis 08/19/2016   Constipation 10/22/2016   CAP (community acquired pneumonia) 10/22/2016   Hydronephrosis of right kidney 10/22/2016   Nephrolithiasis 10/24/2016   AKI (acute kidney injury) (HCC) 11/09/2016   Hypercalcemia 11/09/2016   Hyponatremia 11/09/2016   Hypovolemia 11/09/2016   Accelerated hypertension 11/09/2016   CKD (chronic kidney disease) stage 5, GFR less than 15 ml/min (HCC) 03/10/2018   Fall 06/09/2018   Fall at home, initial encounter 06/09/2018   Syncope 12/03/2019   Wrist pain, acute, right    Weakness    Acute on chronic right-sided heart failure (HCC)    Hypoxia    Stroke (cerebrum) (HCC) 03/29/2020   Change in mental status 03/30/2020   Past Medical History:  Diagnosis Date   Ambulates with cane    ESRD (end stage renal disease) (HCC)    History of stress test    Osteoporosis    Stroke (HCC) 04-01-11   TIA (transient ischemic attack) 03-12-11   Tricuspid regurgitation    Vitamin D deficiency     SOCIAL HX:  Social History   Tobacco Use   Smoking status: Every Day    Packs/day: 0.30    Years: 32.00    Additional pack years: 0.00  Total pack years: 9.60    Types: Cigarettes    Last attempt to quit: 09/23/2012    Years since quitting: 10.2    Passive exposure: Current   Smokeless tobacco: Never   Tobacco comments:    8 cigarettes/day  Substance Use Topics   Alcohol use: No     FAMILY HX:  Family History  Problem Relation Age of Onset   Stroke Mother    Diabetes Mother    Kidney failure Mother    Heart failure Mother    Stroke Father    Cancer Sister        Breast-  36's   Colon cancer Maternal Grandmother    Pancreatic cancer Neg Hx    Esophageal cancer Neg Hx    Liver disease Neg Hx    Stomach cancer Neg Hx       ALLERGIES:  Allergies  Allergen Reactions   Hydrocodone Nausea And Vomiting and Other (See Comments)    "Allergic," per MAR      PERTINENT MEDICATIONS:  Outpatient Encounter Medications as of 12/10/2022  Medication Sig   acetaminophen (TYLENOL) 500 MG tablet Take 500 mg by mouth every 6 (six) hours as needed (for pain).   albuterol (VENTOLIN HFA) 108 (90 Base) MCG/ACT inhaler Inhale 1 puff into the lungs every 6 (six) hours as needed for wheezing or shortness of breath.   amLODipine (NORVASC) 10 MG tablet Take 1 tablet (10 mg total) by mouth daily.   aspirin 81 MG chewable tablet Chew 81 mg by mouth daily.   atorvastatin (LIPITOR) 40 MG tablet Take 40 mg by mouth at bedtime.    b complex-vitamin c-folic acid (NEPHRO-VITE) 0.8 MG TABS tablet Take 1 tablet by mouth at bedtime.   carvedilol (COREG) 12.5 MG tablet Take 1 tablet (12.5 mg total) by mouth 2 (two) times daily with a meal.   chlorhexidine (PERIDEX) 0.12 % solution Use as directed 15 mLs in the mouth or throat 2 (two) times daily. (Patient not taking: Reported on 12/04/2022)   cloNIDine (CATAPRES) 0.1 MG tablet Take 0.1 mg by mouth every 8 (eight) hours as needed (for a B/P reading greater than 160/90).   fish oil-omega-3 fatty acids 1000 MG capsule Take 1 g by mouth in the morning and at bedtime.   levothyroxine (SYNTHROID) 100 MCG tablet Take 100 mcg by mouth daily before breakfast.   midodrine (PROAMATINE) 5 MG tablet Take 5 mg by mouth See admin instructions. Take 5 mg by mouth in the morning as directed on Tues/Thurs/Sat   omeprazole (PRILOSEC) 20 MG capsule Take 20 mg by mouth in the morning.   OXYGEN Inhale 2 L/min into the lungs as needed (for a saturation less than 90%).   senna-docusate (SENOKOT-S) 8.6-50 MG tablet Take 1 tablet by mouth at bedtime as needed for mild  constipation.   sevelamer carbonate (RENVELA) 800 MG tablet Take 800 mg by mouth 3 (three) times daily with meals.   No facility-administered encounter medications on file as of 12/10/2022.   I spent 35 minutes providing this consultation; this includes time spent with patient/family, chart review and documentation. More than 50% of the time in this consultation was spent on counseling and coordinating communication   Thank you for the opportunity to participate in the care of Ms. Spayd.  The palliative care team will continue to follow. Please call our office at [phone] if we can be of additional assistance.   Note: Portions of this note were generated with Dragon dictation  software. Dictation errors may occur despite best attempts at proofreading.  Teodoro Spray, NP

## 2023-01-31 ENCOUNTER — Emergency Department (HOSPITAL_COMMUNITY)
Admission: EM | Admit: 2023-01-31 | Payer: Medicare Other | Source: Home / Self Care | Attending: Emergency Medicine | Admitting: Emergency Medicine

## 2023-01-31 ENCOUNTER — Other Ambulatory Visit: Payer: Self-pay

## 2023-01-31 DIAGNOSIS — Z79899 Other long term (current) drug therapy: Secondary | ICD-10-CM | POA: Insufficient documentation

## 2023-01-31 DIAGNOSIS — Z7982 Long term (current) use of aspirin: Secondary | ICD-10-CM | POA: Insufficient documentation

## 2023-01-31 DIAGNOSIS — Y69 Unspecified misadventure during surgical and medical care: Secondary | ICD-10-CM | POA: Insufficient documentation

## 2023-01-31 DIAGNOSIS — I10 Essential (primary) hypertension: Secondary | ICD-10-CM | POA: Diagnosis not present

## 2023-01-31 DIAGNOSIS — T82838A Hemorrhage of vascular prosthetic devices, implants and grafts, initial encounter: Secondary | ICD-10-CM | POA: Diagnosis not present

## 2023-01-31 NOTE — ED Provider Notes (Signed)
[place] EMERGENCY DEPARTMENT AT [facility] Provider Note   CSN: [id number] Arrival date & time: 01/31/23  1811     History  Chief Complaint  Patient presents with   Wound Check    Pt coming from dialysis with a bleeding fistula. Wound had been bleeding for approx 2 hours. Pt has no other complaints.     [name] Michael is a 62 y.o. female.  HPI 62 year old female presents with a bleeding dialysis access site.  She states has been bleeding since she got out of dialysis this afternoon.  No recent illness.  Denies dizziness, lightheadedness, chest pain, shortness of breath, etc.  She has had issues with hard to control bleeding from her fistula before.  Home Medications Prior to Admission medications   Medication Sig Start Date End Date Taking? Authorizing Provider  acetaminophen (TYLENOL) 500 MG tablet Take 500 mg by mouth every 6 (six) hours as needed (for pain).    [provider]  albuterol (VENTOLIN HFA) 108 (90 Base) MCG/ACT inhaler Inhale 1 puff into the lungs every 6 (six) hours as needed for wheezing or shortness of breath.    [provider]  amLODipine (NORVASC) 10 MG tablet Take 1 tablet (10 mg total) by mouth daily. 12/08/22   Burnadette Pop, MD  aspirin 81 MG chewable tablet Chew 81 mg by mouth daily.    [provider]  atorvastatin (LIPITOR) 40 MG tablet Take 40 mg by mouth at bedtime.     [provider]  b complex-vitamin c-folic acid (NEPHRO-VITE) 0.8 MG TABS tablet Take 1 tablet by mouth at bedtime.    [provider]  carvedilol (COREG) 12.5 MG tablet Take 1 tablet (12.5 mg total) by mouth 2 (two) times daily with a meal. 12/07/22 01/06/23  Burnadette Pop, MD  chlorhexidine (PERIDEX) 0.12 % solution Use as directed 15 mLs in the mouth or throat 2 (two) times daily. Patient not taking: Reported on 12/04/2022 11/29/22   Palumbo, April, MD  cloNIDine (CATAPRES) 0.1 MG tablet Take 0.1 mg by mouth every 8 (eight)  hours as needed (for a B/P reading greater than 160/90).    [provider]  fish oil-omega-3 fatty acids 1000 MG capsule Take 1 g by mouth in the morning and at bedtime.    [provider]  levothyroxine (SYNTHROID) 100 MCG tablet Take 100 mcg by mouth daily before breakfast.    [provider]  midodrine (PROAMATINE) 5 MG tablet Take 5 mg by mouth See admin instructions. Take 5 mg by mouth in the morning as directed on Tues/Thurs/Sat    [provider]  omeprazole (PRILOSEC) 20 MG capsule Take 20 mg by mouth in the morning.    [provider]  OXYGEN Inhale 2 L/min into the lungs as needed (for a saturation less than 90%).    [provider]  senna-docusate (SENOKOT-S) 8.6-50 MG tablet Take 1 tablet by mouth at bedtime as needed for mild constipation. 06/11/18   Regalado, [name], MD  sevelamer carbonate (RENVELA) 800 MG tablet Take 800 mg by mouth 3 (three) times daily with meals.    [provider]      Allergies    Hydrocodone    Review of Systems   Review of Systems  Respiratory:  Negative for shortness of breath.   Cardiovascular:  Negative for chest pain.  Skin:  Positive for wound.  Neurological:  Negative for light-headedness.    Physical Exam Updated Vital Signs BP Marland Kitchen)  171/62   Pulse 68   Temp 98.4 F (36.9 C) (Oral)   Resp 18   Ht 4\' 7"  (1.397 m)   Wt 37.2 kg   LMP 03/06/2013 (LMP Unknown)   SpO2 92%   BMI 19.06 kg/m  Physical Exam Vitals and nursing note reviewed.  Constitutional:      Appearance: She is well-developed.  HENT:     Head: Normocephalic and atraumatic.  Cardiovascular:     Rate and Rhythm: Normal rate and regular rhythm.     Comments: Normal heart rate on monitor.  Normal pulse rate and thrill Pulmonary:     Effort: Pulmonary effort is normal.  Abdominal:     General: There is no distension.  Skin:    General: Skin is warm and dry.     Comments: Normal left upper extremity  thrill.  There are a couple small puncture wounds from dialysis but these are no longer bleeding when the dressing was removed  Neurological:     Mental Status: She is alert.     ED Results / Procedures / Treatments   Labs (all labs ordered are listed, but only abnormal results are displayed) Labs Reviewed - No data to display  EKG None  Radiology No results found.  Procedures Procedures    Medications Ordered in ED Medications - No data to display  ED Course/ Medical Decision Making/ A&P                                 Medical Decision Making Amount and/or Complexity of Data Reviewed External Data Reviewed: notes.   The nurse applied an ABD with a wrap.  When I took this down, there is no evidence of current bleeding.  We watched this for a little while and there was no bleeding so dressing was applied again.  No further bleeding.  No indication for workup as her vitals are unremarkable besides some hypertension, I highly doubt that she has had significant enough blood loss to require blood transfusion or monitoring in the hospital.  She is okay with holding off on labs as well.  Will discharge back to her facility with return precautions.  Follow-up with vascular surgery.        Final Clinical Impression(s) / ED Diagnoses Final diagnoses:  Bleeding from dialysis shunt, initial encounter [facility])    Rx / DC Orders ED Discharge Orders     None         Pricilla Loveless, MD 01/31/23 1942

## 2023-01-31 NOTE — ED Notes (Signed)
Abd pad placed to site and ACE bandage applied. PMS intact prior to and after bleeding control.

## 2023-01-31 NOTE — ED Notes (Signed)
Pt is a&ox4, pwd to touch. Pt has a bleeding fistula site and has been bleeding for 2 hours. ABD pad placed and ACE bandage applied. Site not longer bleeding upon inspection by MD. Site re-bandaged with pad and ace. Pt complains of chronic neck and back pain at a 10/10. Side rails up x 2, call light with patient.

## 2023-01-31 NOTE — ED Notes (Signed)
This nurse spoke with Marylene Land, Diplomatic Services operational officer at [facility]- Marylene Land states that nurse and staff are busy and I explained that we are not delaying transport and pt will be returning via [facility]/

## 2023-01-31 NOTE — ED Notes (Signed)
[facility] called, no eta 

## 2023-01-31 NOTE — Discharge Instructions (Signed)
Your fistula was bleeding but this has stopped.  If you notice recurrent bleeding, trouble breathing, chest pain, dizziness, or any other new/concerning symptoms then return to the ER or call 911.  Otherwise follow-up with your vascular surgeon.

## 2023-01-31 NOTE — ED Notes (Signed)
Shift report received, assumed care of patient at this time 

## 2023-03-03 ENCOUNTER — Inpatient Hospital Stay (HOSPITAL_COMMUNITY): Payer: Medicare Other

## 2023-03-03 ENCOUNTER — Emergency Department (HOSPITAL_COMMUNITY): Payer: Medicare Other

## 2023-03-03 ENCOUNTER — Inpatient Hospital Stay (HOSPITAL_COMMUNITY)
Admission: EM | Admit: 2023-03-03 | Discharge: 2023-03-06 | DRG: 308 | Disposition: A | Discharge status: Skilled Nursing Facility | Payer: Medicare Other | Source: Skilled Nursing Facility | Attending: Internal Medicine | Admitting: Internal Medicine

## 2023-03-03 DIAGNOSIS — E876 Hypokalemia: Secondary | ICD-10-CM | POA: Diagnosis not present

## 2023-03-03 DIAGNOSIS — E11649 Type 2 diabetes mellitus with hypoglycemia without coma: Secondary | ICD-10-CM | POA: Diagnosis present

## 2023-03-03 DIAGNOSIS — Z885 Allergy status to narcotic agent status: Secondary | ICD-10-CM

## 2023-03-03 DIAGNOSIS — N186 End stage renal disease: Secondary | ICD-10-CM | POA: Diagnosis present

## 2023-03-03 DIAGNOSIS — E1122 Type 2 diabetes mellitus with diabetic chronic kidney disease: Secondary | ICD-10-CM | POA: Diagnosis present

## 2023-03-03 DIAGNOSIS — R001 Bradycardia, unspecified: Secondary | ICD-10-CM | POA: Diagnosis present

## 2023-03-03 DIAGNOSIS — F17210 Nicotine dependence, cigarettes, uncomplicated: Secondary | ICD-10-CM | POA: Diagnosis present

## 2023-03-03 DIAGNOSIS — D631 Anemia in chronic kidney disease: Secondary | ICD-10-CM | POA: Diagnosis present

## 2023-03-03 DIAGNOSIS — I071 Rheumatic tricuspid insufficiency: Secondary | ICD-10-CM | POA: Diagnosis present

## 2023-03-03 DIAGNOSIS — G9340 Encephalopathy, unspecified: Secondary | ICD-10-CM | POA: Diagnosis present

## 2023-03-03 DIAGNOSIS — Z7982 Long term (current) use of aspirin: Secondary | ICD-10-CM

## 2023-03-03 DIAGNOSIS — E875 Hyperkalemia: Secondary | ICD-10-CM | POA: Diagnosis present

## 2023-03-03 DIAGNOSIS — Z823 Family history of stroke: Secondary | ICD-10-CM

## 2023-03-03 DIAGNOSIS — K219 Gastro-esophageal reflux disease without esophagitis: Secondary | ICD-10-CM | POA: Diagnosis present

## 2023-03-03 DIAGNOSIS — Z992 Dependence on renal dialysis: Secondary | ICD-10-CM | POA: Diagnosis not present

## 2023-03-03 DIAGNOSIS — I2781 Cor pulmonale (chronic): Secondary | ICD-10-CM | POA: Diagnosis present

## 2023-03-03 DIAGNOSIS — I5082 Biventricular heart failure: Secondary | ICD-10-CM | POA: Diagnosis present

## 2023-03-03 DIAGNOSIS — E871 Hypo-osmolality and hyponatremia: Secondary | ICD-10-CM | POA: Diagnosis present

## 2023-03-03 DIAGNOSIS — I442 Atrioventricular block, complete: Principal | ICD-10-CM | POA: Diagnosis present

## 2023-03-03 DIAGNOSIS — J9601 Acute respiratory failure with hypoxia: Secondary | ICD-10-CM | POA: Diagnosis present

## 2023-03-03 DIAGNOSIS — I5032 Chronic diastolic (congestive) heart failure: Secondary | ICD-10-CM | POA: Diagnosis present

## 2023-03-03 DIAGNOSIS — J9602 Acute respiratory failure with hypercapnia: Secondary | ICD-10-CM | POA: Diagnosis present

## 2023-03-03 DIAGNOSIS — I132 Hypertensive heart and chronic kidney disease with heart failure and with stage 5 chronic kidney disease, or end stage renal disease: Secondary | ICD-10-CM | POA: Diagnosis present

## 2023-03-03 DIAGNOSIS — I272 Pulmonary hypertension, unspecified: Secondary | ICD-10-CM | POA: Diagnosis not present

## 2023-03-03 DIAGNOSIS — Z800 Family history of malignant neoplasm of digestive organs: Secondary | ICD-10-CM

## 2023-03-03 DIAGNOSIS — E039 Hypothyroidism, unspecified: Secondary | ICD-10-CM | POA: Diagnosis present

## 2023-03-03 DIAGNOSIS — Z66 Do not resuscitate: Secondary | ICD-10-CM | POA: Diagnosis present

## 2023-03-03 DIAGNOSIS — G9341 Metabolic encephalopathy: Secondary | ICD-10-CM | POA: Diagnosis present

## 2023-03-03 DIAGNOSIS — N2581 Secondary hyperparathyroidism of renal origin: Secondary | ICD-10-CM | POA: Diagnosis present

## 2023-03-03 DIAGNOSIS — I2729 Other secondary pulmonary hypertension: Secondary | ICD-10-CM | POA: Diagnosis present

## 2023-03-03 DIAGNOSIS — M810 Age-related osteoporosis without current pathological fracture: Secondary | ICD-10-CM | POA: Diagnosis present

## 2023-03-03 DIAGNOSIS — Z809 Family history of malignant neoplasm, unspecified: Secondary | ICD-10-CM

## 2023-03-03 DIAGNOSIS — Z8673 Personal history of transient ischemic attack (TIA), and cerebral infarction without residual deficits: Secondary | ICD-10-CM

## 2023-03-03 DIAGNOSIS — Z8249 Family history of ischemic heart disease and other diseases of the circulatory system: Secondary | ICD-10-CM

## 2023-03-03 DIAGNOSIS — Z79899 Other long term (current) drug therapy: Secondary | ICD-10-CM

## 2023-03-03 DIAGNOSIS — Z833 Family history of diabetes mellitus: Secondary | ICD-10-CM

## 2023-03-03 DIAGNOSIS — E785 Hyperlipidemia, unspecified: Secondary | ICD-10-CM | POA: Diagnosis present

## 2023-03-03 DIAGNOSIS — Z841 Family history of disorders of kidney and ureter: Secondary | ICD-10-CM

## 2023-03-03 DIAGNOSIS — Z79890 Hormone replacement therapy (postmenopausal): Secondary | ICD-10-CM

## 2023-03-03 DIAGNOSIS — E861 Hypovolemia: Secondary | ICD-10-CM | POA: Diagnosis present

## 2023-03-03 LAB — I-STAT CHEM 8, ED
BUN: 45 mg/dL — ABNORMAL HIGH (ref 8–23)
Calcium, Ion: 1.07 mmol/L — ABNORMAL LOW (ref 1.15–1.40)
Chloride: 96 mmol/L — ABNORMAL LOW (ref 98–111)
Creatinine, Ser: 6.7 mg/dL — ABNORMAL HIGH (ref 0.44–1.00)
Glucose, Bld: 215 mg/dL — ABNORMAL HIGH (ref 70–99)
HCT: 41 % (ref 36.0–46.0)
Hemoglobin: 13.9 g/dL (ref 12.0–15.0)
Potassium: 6.8 mmol/L (ref 3.5–5.1)
Sodium: 125 mmol/L — ABNORMAL LOW (ref 135–145)
TCO2: 21 mmol/L — ABNORMAL LOW (ref 22–32)

## 2023-03-03 LAB — COMPREHENSIVE METABOLIC PANEL
ALT: 16 U/L (ref 0–44)
AST: 20 U/L (ref 15–41)
Albumin: 3.4 g/dL — ABNORMAL LOW (ref 3.5–5.0)
Alkaline Phosphatase: 108 U/L (ref 38–126)
Anion gap: 16 — ABNORMAL HIGH (ref 5–15)
BUN: 44 mg/dL — ABNORMAL HIGH (ref 8–23)
CO2: 19 mmol/L — ABNORMAL LOW (ref 22–32)
Calcium: 8.6 mg/dL — ABNORMAL LOW (ref 8.9–10.3)
Chloride: 91 mmol/L — ABNORMAL LOW (ref 98–111)
Creatinine, Ser: 5.93 mg/dL — ABNORMAL HIGH (ref 0.44–1.00)
GFR, Estimated: 8 mL/min — ABNORMAL LOW (ref >60)
Glucose, Bld: 216 mg/dL — ABNORMAL HIGH (ref 70–99)
Potassium: 6.7 mmol/L (ref 3.5–5.1)
Sodium: 126 mmol/L — ABNORMAL LOW (ref 135–145)
Total Bilirubin: 0.8 mg/dL (ref 0.3–1.2)
Total Protein: 6.7 g/dL (ref 6.5–8.1)

## 2023-03-03 LAB — T4, FREE: Free T4: 1.15 ng/dL — ABNORMAL HIGH (ref 0.61–1.12)

## 2023-03-03 LAB — BASIC METABOLIC PANEL
Anion gap: 16 — ABNORMAL HIGH (ref 5–15)
BUN: 5 mg/dL — ABNORMAL LOW (ref 8–23)
CO2: 27 mmol/L (ref 22–32)
Calcium: 8.1 mg/dL — ABNORMAL LOW (ref 8.9–10.3)
Chloride: 89 mmol/L — ABNORMAL LOW (ref 98–111)
Creatinine, Ser: 1.14 mg/dL — ABNORMAL HIGH (ref 0.44–1.00)
GFR, Estimated: 54 mL/min — ABNORMAL LOW (ref >60)
Glucose, Bld: 70 mg/dL (ref 70–99)
Potassium: 3 mmol/L — ABNORMAL LOW (ref 3.5–5.1)
Sodium: 132 mmol/L — ABNORMAL LOW (ref 135–145)

## 2023-03-03 LAB — LACTIC ACID, PLASMA
Lactic Acid, Venous: 1.5 mmol/L (ref 0.5–1.9)
Lactic Acid, Venous: 0.7 mmol/L (ref 0.5–1.9)

## 2023-03-03 LAB — CBC WITH DIFFERENTIAL/PLATELET
Abs Immature Granulocytes: 0.05 10*3/uL (ref 0.00–0.07)
Basophils Absolute: 0.1 10*3/uL (ref 0.0–0.1)
Basophils Relative: 1 %
Eosinophils Absolute: 0.4 10*3/uL (ref 0.0–0.5)
Eosinophils Relative: 5 %
HCT: 39.6 % (ref 36.0–46.0)
Hemoglobin: 12.4 g/dL (ref 12.0–15.0)
Immature Granulocytes: 1 %
Lymphocytes Relative: 10 %
Lymphs Abs: 0.9 10*3/uL (ref 0.7–4.0)
MCH: 29.5 pg (ref 26.0–34.0)
MCHC: 31.3 g/dL (ref 30.0–36.0)
MCV: 94.3 fL (ref 80.0–100.0)
Monocytes Absolute: 0.6 10*3/uL (ref 0.1–1.0)
Monocytes Relative: 7 %
Neutro Abs: 6.8 10*3/uL (ref 1.7–7.7)
Neutrophils Relative %: 76 %
Platelets: 235 10*3/uL (ref 150–400)
RBC: 4.2 MIL/uL (ref 3.87–5.11)
RDW: 18.3 % — ABNORMAL HIGH (ref 11.5–15.5)
WBC: 8.8 10*3/uL (ref 4.0–10.5)
nRBC: 0.2 % (ref 0.0–0.2)

## 2023-03-03 LAB — BLOOD GAS, VENOUS
Acid-Base Excess: 6.9 mmol/L — ABNORMAL HIGH (ref 0.0–2.0)
Bicarbonate: 32.5 mmol/L — ABNORMAL HIGH (ref 20.0–28.0)
O2 Saturation: 75.9 %
Patient temperature: 36.9
pCO2, Ven: 49 mmHg (ref 44–60)
pH, Ven: 7.43 (ref 7.25–7.43)
pO2, Ven: 45 mmHg (ref 32–45)

## 2023-03-03 LAB — LIPASE, BLOOD: Lipase: 31 U/L (ref 11–51)

## 2023-03-03 LAB — TROPONIN I (HIGH SENSITIVITY)
Troponin I (High Sensitivity): 9 ng/L (ref <18)
Troponin I (High Sensitivity): 17 ng/L (ref <18)

## 2023-03-03 LAB — MRSA NEXT GEN BY PCR, NASAL: MRSA by PCR Next Gen: NOT DETECTED

## 2023-03-03 LAB — GLUCOSE, CAPILLARY
Glucose-Capillary: 181 mg/dL — ABNORMAL HIGH (ref 70–99)
Glucose-Capillary: 123 mg/dL — ABNORMAL HIGH (ref 70–99)
Glucose-Capillary: 50 mg/dL — ABNORMAL LOW (ref 70–99)
Glucose-Capillary: 63 mg/dL — ABNORMAL LOW (ref 70–99)
Glucose-Capillary: 140 mg/dL — ABNORMAL HIGH (ref 70–99)
Glucose-Capillary: 87 mg/dL (ref 70–99)

## 2023-03-03 LAB — HEPATITIS B SURFACE ANTIGEN: Hepatitis B Surface Ag: NONREACTIVE

## 2023-03-03 LAB — BRAIN NATRIURETIC PEPTIDE: B Natriuretic Peptide: 1274.1 pg/mL — ABNORMAL HIGH (ref 0.0–100.0)

## 2023-03-03 LAB — MAGNESIUM: Magnesium: 2.2 mg/dL (ref 1.7–2.4)

## 2023-03-03 LAB — TSH: TSH: 2.464 u[IU]/mL (ref 0.350–4.500)

## 2023-03-03 MED ORDER — CALCIUM GLUCONATE 10 % IV SOLN
1.0000 g | Freq: Once | INTRAVENOUS | Status: AC
  Administered 2023-03-03: 1 g via INTRAVENOUS

## 2023-03-03 MED ORDER — INSULIN ASPART 100 UNIT/ML IJ SOLN
0.0000 [IU] | Freq: Three times a day (TID) | INTRAMUSCULAR | Status: DC
  Administered 2023-03-04: 1 [IU] via SUBCUTANEOUS
  Administered 2023-03-05 – 2023-03-06 (×2): 2 [IU] via SUBCUTANEOUS

## 2023-03-03 MED ORDER — INSULIN ASPART 100 UNIT/ML IV SOLN
5.0000 [IU] | Freq: Once | INTRAVENOUS | Status: DC
  Administered 2023-03-03: 5 [IU] via INTRAVENOUS

## 2023-03-03 MED ORDER — DEXTROSE 50 % IV SOLN: 25.0000 g | INTRAVENOUS | Status: AC

## 2023-03-03 MED ORDER — MIDAZOLAM HCL 2 MG/2ML IJ SOLN
1.0000 mg | Freq: Once | INTRAMUSCULAR | Status: AC
  Administered 2023-03-03: 1 mg via INTRAVENOUS
  Filled 2023-03-03: qty 2

## 2023-03-03 MED ORDER — CALCIUM GLUCONATE-NACL 1-0.675 GM/50ML-% IV SOLN: 1.0000 g | Freq: Once | INTRAVENOUS | Status: DC

## 2023-03-03 MED ORDER — CHLORHEXIDINE GLUCONATE CLOTH 2 % EX PADS
6.0000 | MEDICATED_PAD | Freq: Every day | CUTANEOUS | Status: DC
  Administered 2023-03-03 (×2): 6 via TOPICAL

## 2023-03-03 MED ORDER — ACETAMINOPHEN 10 MG/ML IV SOLN
1000.0000 mg | Freq: Once | INTRAVENOUS | Status: AC
  Administered 2023-03-03: 1000 mg via INTRAVENOUS
  Filled 2023-03-03: qty 100

## 2023-03-03 MED ORDER — INSULIN ASPART 100 UNIT/ML IV SOLN: 10.0000 [IU] | Freq: Once | INTRAVENOUS | Status: DC

## 2023-03-03 MED ORDER — MIDAZOLAM HCL 2 MG/2ML IJ SOLN
2.0000 mg | Freq: Once | INTRAMUSCULAR | Status: AC
  Administered 2023-03-03: 2 mg via INTRAVENOUS

## 2023-03-03 MED ORDER — DEXTROSE 10 % IV SOLN: INTRAVENOUS | Status: DC

## 2023-03-03 MED ORDER — ORAL CARE MOUTH RINSE: 15.0000 mL | OROMUCOSAL | Status: DC | PRN

## 2023-03-03 MED ORDER — MORPHINE SULFATE (PF) 2 MG/ML IV SOLN: 2.0000 mg | Freq: Two times a day (BID) | INTRAVENOUS | Status: DC | PRN

## 2023-03-03 MED ORDER — MIDAZOLAM HCL 2 MG/2ML IJ SOLN
INTRAMUSCULAR | Status: AC
  Filled 2023-03-03: qty 2

## 2023-03-03 MED ORDER — INSULIN ASPART 100 UNIT/ML IJ SOLN
5.0000 [IU] | Freq: Once | INTRAMUSCULAR | Status: AC
  Administered 2023-03-03: 5 [IU] via SUBCUTANEOUS

## 2023-03-03 MED ORDER — MORPHINE SULFATE (PF) 2 MG/ML IV SOLN
2.0000 mg | Freq: Once | INTRAVENOUS | Status: AC
  Administered 2023-03-03: 2 mg via INTRAVENOUS
  Filled 2023-03-03: qty 1

## 2023-03-03 MED ORDER — EPINEPHRINE HCL 5 MG/250ML IV SOLN IN NS
0.0000 ug/min | INTRAVENOUS | Status: DC
  Administered 2023-03-03: 8 ug/min via INTRAVENOUS

## 2023-03-03 MED ORDER — DEXTROSE 50 % IV SOLN: 1.0000 | Freq: Once | INTRAVENOUS | Status: DC

## 2023-03-03 MED ORDER — DEXTROSE 50 % IV SOLN
INTRAVENOUS | Status: AC
  Administered 2023-03-03: 25 g via INTRAVENOUS
  Filled 2023-03-03: qty 50

## 2023-03-03 MED ORDER — MORPHINE SULFATE (PF) 2 MG/ML IV SOLN
2.0000 mg | INTRAVENOUS | Status: DC | PRN
  Administered 2023-03-03: 2 mg via INTRAVENOUS
  Filled 2023-03-03: qty 1

## 2023-03-03 MED ORDER — SODIUM CHLORIDE 0.9 % IV BOLUS: 500.0000 mL | Freq: Once | INTRAVENOUS | Status: DC

## 2023-03-03 MED ORDER — ORAL CARE MOUTH RINSE
15.0000 mL | OROMUCOSAL | Status: DC
  Administered 2023-03-03 – 2023-03-06 (×7): 15 mL via OROMUCOSAL

## 2023-03-03 MED ORDER — DEXTROSE 50 % IV SOLN
INTRAVENOUS | Status: AC
  Administered 2023-03-03: 25 mL
  Filled 2023-03-03: qty 50

## 2023-03-03 MED ORDER — HEPARIN SODIUM (PORCINE) 5000 UNIT/ML IJ SOLN
5000.0000 [IU] | Freq: Three times a day (TID) | INTRAMUSCULAR | Status: DC
  Administered 2023-03-03 – 2023-03-06 (×8): 5000 [IU] via SUBCUTANEOUS
  Filled 2023-03-03 (×7): qty 1

## 2023-03-03 MED ORDER — DEXTROSE 50 % IV SOLN
1.0000 | Freq: Once | INTRAVENOUS | Status: DC
  Administered 2023-03-03: 50 mL via INTRAVENOUS

## 2023-03-03 MED ORDER — ALBUTEROL SULFATE (2.5 MG/3ML) 0.083% IN NEBU
10.0000 mg | INHALATION_SOLUTION | Freq: Once | RESPIRATORY_TRACT | Status: AC
  Administered 2023-03-03: 2.5 mg via RESPIRATORY_TRACT
  Filled 2023-03-03: qty 12

## 2023-03-03 NOTE — Procedures (Signed)
I was present at this dialysis session. I have reviewed the session itself and made appropriate changes.   Delay in initiating hemodialysis due to encephalopathy with agitation and difficulty with cannulation.  Significant blood loss from the fistula.  Now both needles have been placed and patient is calm after sedation and tolerating hemodialysis.  Plan for 2K bath, no UF.  Remains on epinephrine drip, with transcutaneous pacing.  Hopefully these things will improve over the course of dialysis.  There were no vitals filed for this visit.  Recent Labs  Lab 03/03/23 1107 03/03/23 1113  NA 126* 125*  K 6.7* 6.8*  CL 91* 96*  CO2 19*  --   GLUCOSE 216* 215*  BUN 44* 45*  CREATININE 5.93* 6.70*  CALCIUM 8.6*  --     Recent Labs  Lab 03/03/23 1107 03/03/23 1113  WBC 8.8  --   NEUTROABS 6.8  --   HGB 12.4 13.9  HCT 39.6 41.0  MCV 94.3  --   PLT 235  --     Scheduled Meds:  dextrose       Chlorhexidine Gluconate Cloth  6 each Topical Q0600   dextrose  25 g Intravenous STAT   heparin  5,000 Units Subcutaneous Q8H   insulin aspart  0-9 Units Subcutaneous TID WC   midazolam       midazolam  1 mg Intravenous Once   Continuous Infusions:  epinephrine 8 mcg/min (03/03/23 1300)   PRN Meds:.dextrose, midazolam, morphine injection   [name] Heck  MD 03/03/2023, 3:46 PM

## 2023-03-03 NOTE — Progress Notes (Signed)
eLink Physician-Brief Progress Note Patient Name: [name] Michael DOB: 1960-07-18 MRN: [id number]   Date of Service  03/03/2023  HPI/Events of Note  Hypoglycemia, altered mental status, and anisocoria.  eICU Interventions  Stat CT head to r/o CVA, Ofirmev 1 gm iv x 1 to prevent need for narcotics, D 10 gtt for low blood sugar.        [name] U [name] 03/03/2023, 8:00 PM

## 2023-03-03 NOTE — ED Triage Notes (Signed)
Pt BIB [facility] form [facility]. Patient reported she was not feeling good. The facility notice the patient saturation in the 60's.  EMS was called they notice the patient was cool and clammy and not able to palpate a radial pulse. Her blood pressure was palpated by manually by EMS it was 90/32 and saturation 90. EMS reported she was alert, but slow to response. EMS said while on the monitor her heart rate was in the 20's and she had a Right Bundle Branch Block  and Prolong QT. EMS  started pacing patient on 70 MA at 10:26 am and hearth rate 70's .Vital signs: BP 141/78, and CBG 263.

## 2023-03-03 NOTE — Progress Notes (Signed)
Re-evaluation of patient She is still in ER CCM and renal at bedside  Currently when not pacing she is in junctional rhythm rate 55 ECG has not been done yet as patient moving around too much  Giving calcium gluconate and has B agonist neb Rx going  Will give 1 mg versed to help with intermittent external pacing need and Dialysis.   Appreciate CCM and renal admission and care of patient going forward  Critical care time 30 minutes  Charlton Haws MD [facility]

## 2023-03-03 NOTE — Consult Note (Signed)
CARDIOLOGY CONSULT NOTE       Patient ID: KRINA BARYLSKI MRN: [id number] DOB/AGE: [birth year] 62 y.o.  Admit date: 03/03/2023 Referring Physician: Tegler Primary Physician: Renford Dills, MD Primary Cardiologist: Tenny Craw Reason for Consultation: Bradycardia  Active Problems:   * No active hospital problems. *   HPI:  62 y.o. asked to help emergently for bradycardia. Patient arrived from assisted living externally pacing. She is alert. Groaning from external AP pacing but told me her name. Review of chart shows she is CRF on dialysis. Has fistula in [name] Not complaining of chest pain, dyspnea Some dry heaves. ? Missed last dialysis. Underlying rhythm wide complex in 20's when external pacer turned down  Initially Rx with atropine, 1mg  x 2  BP 105 mmHg Given likelyhood of hyperkalemia given 5 mg [place] insulin then 5 mg iv BS by EMS was in 200's After giving amp of epic patients rhythm restored to NSR in 60-80 range  Discussed with ER will need emergent dialysis and CCM admit   Will get ECG  Keep epi running and external pacer on as atropine may wear off  After resuscitation istat K came back 6.8.     ROS All other systems reviewed and negative except as noted above  Past Medical History:  Diagnosis Date   Ambulates with cane    Constipation    Depression    ESRD (end stage renal disease) (HCC)    a. TTS Dialysis.   GERD (gastroesophageal reflux disease)    History of stress test    a. 01/2003 MV: EF 74%, no ischemia/infarct.   Hyperlipidemia    Hypertension    Osteoporosis    Stroke [facility]) 04-01-11   left frontal subcortical, saw Dr. Pearlean Brownie    Syncope 11/2019   TIA (transient ischemic attack) 03-12-11   Tobacco abuse    Tricuspid regurgitation    a. 05/2016 Echo: EF 65-70%, Gr2DD, mild MR, nl RV fxn, Triv TR, PASP ; b. 05/2018 Echo: EF 60-65%, Gr2DD, mild MR/TR, RVSP/PASP ; c. 11/2019 Echo: EF 55-60%, no rwma, mild-mod MR, Sev TR w/ RV dilatation (CTA chest neg for  PE).   Type II diabetes mellitus (HCC)    Vitamin D deficiency     Family History  Problem Relation Age of Onset   Stroke Mother    Diabetes Mother    Kidney failure Mother    Heart failure Mother    Stroke Father    Cancer Sister        Breast- 11's   Colon cancer Maternal Grandmother    Pancreatic cancer Neg Hx    Esophageal cancer Neg Hx    Liver disease Neg Hx    Stomach cancer Neg Hx     Social History   Socioeconomic History   Marital status: Single    Spouse name: Not on file   Number of children: Not on file   Years of education: Not on file   Highest education level: Not on file  Occupational History   Not on file  Tobacco Use   Smoking status: Every Day    Current packs/day: 0.00    Average packs/day: 0.3 packs/day for 32.0 years (9.6 ttl pk-yrs)    Types: Cigarettes    Start date: 09/23/1980    Last attempt to quit: 09/23/2012    Years since quitting: 10.4    Passive exposure: Current   Smokeless tobacco: Never   Tobacco comments:    8 cigarettes/day  Vaping Use  Vaping status: Never Used  Substance and Sexual Activity   Alcohol use: No   Drug use: No   Sexual activity: Not Currently    Birth control/protection: Post-menopausal  Other Topics Concern   Not on file  Social History Narrative   Lives at [facility] in [place], Skilled    Right Handed   Drinks 2-4 cups caffeine daily      Social Determinants of Health   Financial Resource Strain: Not on file  Food Insecurity: No Food Insecurity (12/04/2022)   Hunger Vital Sign    Worried About Running Out of Food in the Last Year: Never true    Ran Out of Food in the Last Year: Never true  Transportation Needs: No Transportation Needs (12/04/2022)   PRAPARE - Administrator, Civil Service (Medical): No    Lack of Transportation (Non-Medical): No  Physical Activity: Not on file  Stress: Not on file  Social Connections: Not on file  Intimate Partner Violence: Not At Risk (12/04/2022)    Humiliation, Afraid, Rape, and Kick questionnaire    Fear of Current or Ex-Partner: No    Emotionally Abused: No    Physically Abused: No    Sexually Abused: No    Past Surgical History:  Procedure Laterality Date   AV FISTULA PLACEMENT Left 02/21/2019   Procedure: BRACHIOCEPHALIC ARTERIOVENOUS (AV) FISTULA CREATION;  Surgeon: Cephus Shelling, MD;  [facility]: [facility] OR;  Service: Vascular;  Laterality: Left;   BASCILIC VEIN TRANSPOSITION Left 04/11/2019   Procedure: SECOND STAGE BASILIC VEIN TRANSPOSITION LEFT ARM;  Surgeon: Cephus Shelling, MD;  Location: [facility] OR;  Service: Vascular;  Laterality: Left;   IR FLUORO GUIDE CV LINE RIGHT  04/29/2019   IR PARACENTESIS  02/20/2021   IR US GUIDE VASC ACCESS RIGHT  04/29/2019   OPEN REDUCTION INTERNAL FIXATION (ORIF) DISTAL RADIAL FRACTURE Left 01/28/2016   Procedure: OPEN REDUCTION INTERNAL FIXATION (ORIF) DISTAL RADIAL FRACTURE;  Surgeon: Bradly Bienenstock, MD;  Location: [facility] OR;  Service: Orthopedics;  Laterality: Left;   REFRACTIVE SURGERY     [facility]   RIGHT HEART CATH N/A 12/05/2019   Procedure: RIGHT HEART CATH;  Surgeon: Yvonne Kendall, MD;  Location: [facility] INVASIVE CV LAB;  Service: Cardiovascular;  Laterality: N/A;   TONSILLECTOMY AND ADENOIDECTOMY     age 62      Current Facility-Administered Medications:    albuterol (PROVENTIL) (2.5 MG/3ML) 0.083% nebulizer solution 10 mg, 10 mg, Nebulization, Once, Tegeler, Canary Brim, MD   calcium gluconate inj 10% (1 g) URGENT USE ONLY!, 1 g, Intravenous, Once, Tegeler, Canary Brim, MD   Chlorhexidine Gluconate Cloth 2 % PADS 6 each, 6 each, Topical, Q0600, Collins, Samantha G, PA-C   insulin aspart (novoLOG) injection 5 Units, 5 Units, Intravenous, Once **AND** dextrose 50 % solution 50 mL, 1 ampule, Intravenous, Once, Tegeler, Canary Brim, MD   sodium chloride 0.9 % bolus 500 mL, 500 mL, Intravenous, Once, Tegeler, Canary Brim, MD  Current Outpatient Medications:     acetaminophen (TYLENOL) 500 MG tablet, Take 500 mg by mouth every 6 (six) hours as needed (for pain)., Disp: , Rfl:    albuterol (VENTOLIN HFA) 108 (90 Base) MCG/ACT inhaler, Inhale 1 puff into the lungs every 6 (six) hours as needed for wheezing or shortness of breath., Disp: , Rfl:    amLODipine (NORVASC) 10 MG tablet, Take 1 tablet (10 mg total) by mouth daily., Disp: 30 tablet, Rfl: 1   aspirin 81 MG chewable tablet,  Chew 81 mg by mouth daily., Disp: , Rfl:    atorvastatin (LIPITOR) 40 MG tablet, Take 40 mg by mouth at bedtime. , Disp: , Rfl:    b complex-vitamin c-folic acid (NEPHRO-VITE) 0.8 MG TABS tablet, Take 1 tablet by mouth at bedtime., Disp: , Rfl:    carvedilol (COREG) 12.5 MG tablet, Take 1 tablet (12.5 mg total) by mouth 2 (two) times daily with a meal., Disp: 60 tablet, Rfl: 1   chlorhexidine (PERIDEX) 0.12 % solution, Use as directed 15 mLs in the mouth or throat 2 (two) times daily. (Patient not taking: Reported on 12/04/2022), Disp: 120 mL, Rfl: 0   cloNIDine (CATAPRES) 0.1 MG tablet, Take 0.1 mg by mouth every 8 (eight) hours as needed (for a B/P reading greater than 160/90)., Disp: , Rfl:    fish oil-omega-3 fatty acids 1000 MG capsule, Take 1 g by mouth in the morning and at bedtime., Disp: , Rfl:    levothyroxine (SYNTHROID) 100 MCG tablet, Take 100 mcg by mouth daily before breakfast., Disp: , Rfl:    midodrine (PROAMATINE) 5 MG tablet, Take 5 mg by mouth See admin instructions. Take 5 mg by mouth in the morning as directed on Tues/Thurs/Sat, Disp: , Rfl:    omeprazole (PRILOSEC) 20 MG capsule, Take 20 mg by mouth in the morning., Disp: , Rfl:    OXYGEN, Inhale 2 L/min into the lungs as needed (for a saturation less than 90%)., Disp: , Rfl:    senna-docusate (SENOKOT-S) 8.6-50 MG tablet, Take 1 tablet by mouth at bedtime as needed for mild constipation., Disp: 30 tablet, Rfl: 0   sevelamer carbonate (RENVELA) 800 MG tablet, Take 800 mg by mouth 3 (three) times daily with  meals., Disp: , Rfl:   albuterol  10 mg Nebulization Once   calcium gluconate  1 g Intravenous Once   Chlorhexidine Gluconate Cloth  6 each Topical Q0600   insulin aspart  5 Units Intravenous Once   And   dextrose  1 ampule Intravenous Once    sodium chloride      Physical Exam: Last menstrual period 03/06/2013.    Chronically ill female Lungs clear SEM JVP elevated with V wave  Fistual in [name] Abdomen benign No edema Post epi BP now 160 mmHg systolic   Labs:   Lab Results  Component Value Date   WBC 5.5 12/04/2022   HGB 13.9 03/03/2023   HCT 41.0 03/03/2023   MCV 96.4 12/04/2022   PLT 149 (L) 12/04/2022    Recent Labs  Lab 03/03/23 1113  NA 125*  K 6.8*  CL 96*  BUN 45*  CREATININE 6.70*  GLUCOSE 215*   Lab Results  Component Value Date   CKTOTAL 135 06/09/2018   CKMB 2.7 03/28/2011   TROPONINI <0.03 06/10/2018    Lab Results  Component Value Date   CHOL 122 03/30/2020   CHOL 253 (H) 02/06/2016   CHOL 133 12/20/2012   Lab Results  Component Value Date   HDL 39 (L) 03/30/2020   HDL 36 (L) 02/06/2016   HDL 34 (L) 12/20/2012   Lab Results  Component Value Date   LDLCALC 26 03/30/2020   LDLCALC NOT CALC 02/06/2016   LDLCALC 34 12/20/2012   Lab Results  Component Value Date   TRIG 285 (H) 03/30/2020   TRIG 553 (H) 02/06/2016   TRIG 325 (H) 12/20/2012   Lab Results  Component Value Date   CHOLHDL 3.1 03/30/2020   CHOLHDL 7.0 (H) 02/06/2016  CHOLHDL 3.9 12/20/2012   Lab Results  Component Value Date   LDLDIRECT 107.1 05/25/2012   LDLDIRECT 105.3 05/12/2011      Radiology: No results found.  EKG: pending monitor initially with wide complex junctional in 20-30 range post atropine and epi sinus in 60-80 range    ASSESSMENT AND PLAN:   Bradycardia:  related to renal failure and hyperkalemia. Continue Rx with atropine as needed NS and epi drip in ER. ER to arrange CCM admission and emergent dialysis via [name] fistula. Can get ECG when not  pacing She was given total 10 units of insulin ER to monitor BS She is alert She had good capture when externally pacing. BP is very stable. Currently no indication to take to cath lab for temporary pacer but will follow closely until she can get K down and be dialyzed It appears that her home meds included coreg which will obviously be held  She is on synthroid and TSH should be checked  Pulmonary HTN:  last TTE done 07/19/22 showed EF 60-65% with severe RVE and severe RAE with severe TR Etiology of right heart failure unclear on review of chart May have some bearing if TMPM needed as wire placement and stability would be difficult  She has had V/Q and multiple CTAls most recently 07/18/22 negative for PE   Signed: Charlton Haws 03/03/2023, 11:24 AM

## 2023-03-03 NOTE — Procedures (Signed)
HD Note:  Some information was entered later than the data was gathered due to patient care needs. The stated time with the data is accurate.  Patient treated at bedside  Patient not responsive, moving constantly.  Informed consent signed and in chart.   Access used: Left upper arm fistula Access issues:  Initial assessment: Fistula has no signs or symptoms of infection.  Bruit and thrill present.  No open areas or drainage.  No inflammation. Patient was restless, moving continuously.  Access arm restrained during treatment. Arterial line did not allow for adequate pressures.  4 min into treatment, machine placed in recirculate and arterial needle removed and replaced. The initial needle had a clot of blood at the end of it. The patient continued to move even while restrained, delaying the process further.  The patient was given medication and calmed to allow for successful initiation of treatment. Treatment started back at 1454. No further issues with treatment.  Patient bleeding during needle removal and site holding was greater than is normal.  Taking 35 min in total. Site without issue post treatment.  Bruit and thrill present.  No drainage.    TX duration:3.75 hours  Total UF removed: kept even as ordered  Hand-off given to patient's nurse.     Zeina Akkerman L. Dareen Piano, RN Kidney Dialysis Unit.

## 2023-03-03 NOTE — ED Notes (Addendum)
Atropine 1 mg at 11:04 Atropine 1 mg at 11:11 Atropine 1 mg at 11:27 Epinephrine 1 mg  at 11:14 Novolog Insulin 5 Unit SQ  at 11:11 Novolog Insulin 5 unit Intravenous at 11:20  Dextrose 1 Amp at 11:17  Pace 60 MA at 80 ppm 11:34 Calcium Gluconate 1 g at  11:41 Versed 1 mg at 11:57

## 2023-03-03 NOTE — H&P (Signed)
NAME:  [name] Michael, MRN:  [id number], DOB:  1961-02-05, LOS: 0 ADMISSION DATE:  03/03/2023, CONSULTATION DATE:  03/03/2023 REFERRING MD:  Rush Landmark, MD, CHIEF COMPLAINT:  symptomatic bradycardia, hyperkalemia   History of Present Illness:  [name] Michael is a 62 y.o. F with PMH ESRD on HD TTS, chronic HFpEF, hypothyroidism, insulin-dependent diabetes mellitus, anemia of chronic kidney disease, GERD, HLD, prior TIA who was BIB EMS from [facility] where she was reportedly not feeling well. Staff at facility noted patient was cool and clammy and they were unable to palpate a radial pulse. Manual BP per EMS was 90/32.  Rhythm noted by EMS was HR 20's either complete heart block or escape rhythm. They began pacing at that time and brought her to [facility] ED. On arrival she was hypoxic, hypotensive, and altered with significant bradycardia. Underlying rhythm when not being paced was consistent with complete heart block per EDP. Istat showed hyperkalemia to 6.8. Cardiology assessed at bedside and she was given insulin, atropine, epinephrine load followed by infusion. She continues to be paced and on epinephrine infusion. PCCM consulted for admission to ICU in setting of symptomatic bradycardia requiring external pacing, hyperkalemia requiring emergent HD, and persistent epinephrine drip.  Pertinent  Medical History  ESRD on HD TTS, chronic HFpEF, hypothyroidism, insulin-dependent diabetes mellitus, anemia of chronic kidney disease, GERD, HLD, prior CVA, prior TIA, osteoporosis  Significant Hospital Events: Including procedures, antibiotic start and stop dates in addition to other pertinent events   09/10 Admitted to ICU  Interim History / Subjective:  As above  Objective   Last menstrual period 03/06/2013.       No intake or output data in the 24 hours ending 03/03/23 1206 There were no vitals filed for this visit.  Examination: Constitutional:Chronically ill appearing female, rolling around in bed,  clearly uncomfortable.  Eyes:Pupils are dilated. Cardio:Paced rhythm.  Pulm:Difficult to assess due to constant moaning, inability to lay still.  Abdomen:Soft, distended, nontender. OAC:[id number] for extremity edema. Skin:Warm and dry. Neuro:Nods yes/no to questions. Makes some sounds as question response. Not able to assess complete neuro status.  Resolved Hospital Problem list   N/A  Assessment & Plan:  Symptomatic bradycardia 2/2 hyperkalemia ESRD on HD TTSa S/p atropine 1 mg x2 doses, epinephrine load followed by infusion, calcium gluconate 1 g x1 dose, insulin, albuterol nebulizer. Apparently missed last HD session. Cardiology, nephrology following. Remains on external pacer. Plan: -Admit to ICU -Continue telemetry -Istat once patient arrives to unit then q3h to monitor K -Emergent hemodialysis per nephrology -Calcium gluconate 1 g infusion -F/u troponin levels, lactic acid -Versed 1 mg IV x1  Hypothyroidism Last thyroid studies 06/2022 with TSH 4.970, T4 1.43. OP regimen includes levothyroxine 125 mcg daily. Plan: -Check TSH, T4  T2DM Last HbA1c 06/2022 5.7%. Plan: -SSI, CBG goal 140-180  Anemia of chronic kidney disease Hgb stable at 12.4. Plan: -Trend CBC, transfuse for Hgb less than 7  Hx CVA Hx TIA HLD On ASA 81 mg daily, atorvastatin 20 mg daily as OP.  Plan: -Continue home ASA and atorvastatin once taking PO  Hx HTN OP regimen includes amlodipine 10 mg daily, carvedilol 12.5 mg BID. Plan: -Hold OP anti-hypertensives at this time  HFpEF BNP 1274.1. Not on OP diuretics that I can see. Last echocardiogram 06/2022 showed LV EF 60-65% with normal function and no RWMA but evidence of increased RV pressure and volume overload with severely enlarged RV, mildly dilated LA, severely dilated RA, severe tricuspid regurgitation. Plan: -Consider  repeat echocardiogram pending results of troponin values  Best Practice (right click and "Reselect all SmartList  Selections" daily)   Diet/type: NPO DVT prophylaxis: prophylactic heparin  GI prophylaxis: N/A Lines: N/A Foley:  N/A Code Status:  DNR Last date of multidisciplinary goals of care discussion [09/10]  Labs   CBC: Recent Labs  Lab 03/03/23 1107 03/03/23 1113  WBC 8.8  --   NEUTROABS 6.8  --   HGB 12.4 13.9  HCT 39.6 41.0  MCV 94.3  --   PLT 235  --     Basic Metabolic Panel: Recent Labs  Lab 03/03/23 1113  NA 125*  K 6.8*  CL 96*  GLUCOSE 215*  BUN 45*  CREATININE 6.70*   GFR: CrCl cannot be calculated (Unknown ideal weight.). Recent Labs  Lab 03/03/23 1107  WBC 8.8    Liver Function Tests: No results for input(s): "AST", "ALT", "ALKPHOS", "BILITOT", "PROT", "ALBUMIN" in the last 168 hours. No results for input(s): "LIPASE", "AMYLASE" in the last 168 hours. No results for input(s): "AMMONIA" in the last 168 hours.  ABG    Component Value Date/Time   PHART 7.439 04/09/2021 1346   PCO2ART 46.9 04/09/2021 1346   PO2ART 60 (L) 04/09/2021 1346   HCO3 36.3 (H) 07/18/2022 1800   TCO2 21 (L) 03/03/2023 1113   O2SAT 78 07/18/2022 1800     Coagulation Profile: No results for input(s): "INR", "PROTIME" in the last 168 hours.  Cardiac Enzymes: No results for input(s): "CKTOTAL", "CKMB", "CKMBINDEX", "TROPONINI" in the last 168 hours.  HbA1C: Hgb A1c MFr Bld  Date/Time Value Ref Range Status  07/19/2022 01:17 AM 5.7 (H) 4.8 - 5.6 % Final    Comment:    (NOTE) Pre diabetes:          5.7%-6.4%  Diabetes:              >6.4%  Glycemic control for   <7.0% adults with diabetes   10/14/2020 04:26 PM 5.9 (H) 4.8 - 5.6 % Final    Comment:    (NOTE) Pre diabetes:          5.7%-6.4%  Diabetes:              >6.4%  Glycemic control for   <7.0% adults with diabetes     CBG: No results for input(s): "GLUCAP" in the last 168 hours.  Review of Systems:   Unable to obtain.  Past Medical History:  She,  has a past medical history of Ambulates with cane,  Constipation, Depression, ESRD (end stage renal disease) (HCC), GERD (gastroesophageal reflux disease), History of stress test, Hyperlipidemia, Hypertension, Osteoporosis, Stroke (HCC) (04-01-11), Syncope (11/2019), TIA (transient ischemic attack) (03-12-11), Tobacco abuse, Tricuspid regurgitation, Type II diabetes mellitus (HCC), and Vitamin D deficiency.   Surgical History:   Past Surgical History:  Procedure Laterality Date   AV FISTULA PLACEMENT Left 02/21/2019   Procedure: BRACHIOCEPHALIC ARTERIOVENOUS (AV) FISTULA CREATION;  Surgeon: Cephus Shelling, MD;  Location: [facility] OR;  Service: Vascular;  Laterality: Left;   BASCILIC VEIN TRANSPOSITION Left 04/11/2019   Procedure: SECOND STAGE BASILIC VEIN TRANSPOSITION LEFT ARM;  Surgeon: Cephus Shelling, MD;  Location: [facility] OR;  Service: Vascular;  Laterality: Left;   IR FLUORO GUIDE CV LINE RIGHT  04/29/2019   IR PARACENTESIS  02/20/2021   IR US GUIDE VASC ACCESS RIGHT  04/29/2019   OPEN REDUCTION INTERNAL FIXATION (ORIF) DISTAL RADIAL FRACTURE Left 01/28/2016   Procedure: OPEN REDUCTION INTERNAL FIXATION (ORIF) DISTAL RADIAL  FRACTURE;  Surgeon: Bradly Bienenstock, MD;  Location: [facility] OR;  Service: Orthopedics;  Laterality: Left;   REFRACTIVE SURGERY     [facility]   RIGHT HEART CATH N/A 12/05/2019   Procedure: RIGHT HEART CATH;  Surgeon: Yvonne Kendall, MD;  [facility]: [facility] INVASIVE CV LAB;  Service: Cardiovascular;  Laterality: N/A;   TONSILLECTOMY AND ADENOIDECTOMY     age 37     Social History:   reports that she has been smoking cigarettes. She started smoking about 42 years ago. She has a 9.6 pack-year smoking history. She has been exposed to tobacco smoke. She has never used smokeless tobacco. She reports that she does not drink alcohol and does not use drugs.   Family History:  Her family history includes Cancer in her sister; Colon cancer in her maternal grandmother; Diabetes in her mother; Heart failure in her mother; Kidney failure  in her mother; Stroke in her father and mother. There is no history of Pancreatic cancer, Esophageal cancer, Liver disease, or Stomach cancer.   Allergies Allergies  Allergen Reactions   Hydrocodone Nausea And Vomiting and Other (See Comments)    "Allergic," per [facility]     Home Medications  Prior to Admission medications   Medication Sig Start Date End Date Taking? Authorizing Provider  aspirin 81 MG chewable tablet Chew 81 mg by mouth daily.   Yes [provider]  acetaminophen (TYLENOL) 500 MG tablet Take 500 mg by mouth every 6 (six) hours as needed (for pain).    [provider]  albuterol (VENTOLIN HFA) 108 (90 Base) MCG/ACT inhaler Inhale 1 puff into the lungs every 6 (six) hours as needed for wheezing or shortness of breath.    [provider]  amLODipine (NORVASC) 10 MG tablet Take 1 tablet (10 mg total) by mouth daily. 12/08/22   Burnadette Pop, MD  b complex-vitamin c-folic acid (NEPHRO-VITE) 0.8 MG TABS tablet Take 1 tablet by mouth at bedtime.    [provider]  carvedilol (COREG) 12.5 MG tablet Take 1 tablet (12.5 mg total) by mouth 2 (two) times daily with a meal. 12/07/22 01/06/23  Burnadette Pop, MD  chlorhexidine (PERIDEX) 0.12 % solution Use as directed 15 mLs in the mouth or throat 2 (two) times daily. Patient not taking: Reported on 12/04/2022 11/29/22   Palumbo, April, MD  cloNIDine (CATAPRES) 0.1 MG tablet Take 0.1 mg by mouth every 8 (eight) hours as needed (for a B/P reading greater than 160/90).    [provider]  fish oil-omega-3 fatty acids 1000 MG capsule Take 1 g by mouth in the morning and at bedtime.    [provider]  levothyroxine (SYNTHROID) 100 MCG tablet Take 100 mcg by mouth daily before breakfast.    [provider]  midodrine (PROAMATINE) 5 MG tablet Take 5 mg by mouth See admin instructions. Take 5 mg by mouth in the morning as directed on Tues/Thurs/Sat    [provider]  omeprazole  (PRILOSEC) 20 MG capsule Take 20 mg by mouth in the morning.    [provider]  OXYGEN Inhale 2 L/min into the lungs as needed (for a saturation less than 90%).    [provider]  senna-docusate (SENOKOT-S) 8.6-50 MG tablet Take 1 tablet by mouth at bedtime as needed for mild constipation. 06/11/18   Regalado, [name], MD  sevelamer carbonate (RENVELA) 800 MG tablet Take 800 mg by mouth 3 (three) times daily with meals.    [provider]  Critical care time: 45 minutes       Champ Mungo, DO Internal Medicine PGY-3 03/03/2023

## 2023-03-03 NOTE — ED Provider Notes (Signed)
[facility] [facility]ERGENCY DEPARTMENT AT [facility] Provider Note   CSN: [id number] Arrival date & time: 03/03/23  1044     History  No chief complaint on file.   [name] Michael is a 62 y.o. female.  The history is provided by the patient, medical records and the EMS personnel. The history is limited by the condition of the patient. No language interpreter was used.  Altered Mental Status Presenting symptoms: partial responsiveness and unresponsiveness   Presenting symptoms: no confusion   Severity:  Severe Most recent episode:  Today Episode history:  Continuous Timing:  Constant Progression:  Waxing and waning Chronicity:  New Associated symptoms: light-headedness   Associated symptoms: no agitation, no depression, no fever, no headaches, no nausea, no palpitations, no vomiting and no weakness        Home Medications Prior to Admission medications   Medication Sig Start Date End Date Taking? Authorizing Provider  acetaminophen (TYLENOL) 500 MG tablet Take 500 mg by mouth every 6 (six) hours as needed (for pain).    [provider]  albuterol (VENTOLIN HFA) 108 (90 Base) MCG/ACT inhaler Inhale 1 puff into the lungs every 6 (six) hours as needed for wheezing or shortness of breath.    [provider]  amLODipine (NORVASC) 10 MG tablet Take 1 tablet (10 mg total) by mouth daily. 12/08/22   Burnadette Pop, MD  aspirin 81 MG chewable tablet Chew 81 mg by mouth daily.    [provider]  atorvastatin (LIPITOR) 40 MG tablet Take 40 mg by mouth at bedtime.     [provider]  b complex-vitamin c-folic acid (NEPHRO-VITE) 0.8 MG TABS tablet Take 1 tablet by mouth at bedtime.    [provider]  carvedilol (COREG) 12.5 MG tablet Take 1 tablet (12.5 mg total) by mouth 2 (two) times daily with a meal. 12/07/22 01/06/23  Burnadette Pop, MD  chlorhexidine (PERIDEX) 0.12 % solution Use as directed 15 mLs in the mouth or throat 2 (two) times  daily. Patient not taking: Reported on 12/04/2022 11/29/22   Palumbo, April, MD  cloNIDine (CATAPRES) 0.1 MG tablet Take 0.1 mg by mouth every 8 (eight) hours as needed (for a B/P reading greater than 160/90).    [provider]  fish oil-omega-3 fatty acids 1000 MG capsule Take 1 g by mouth in the morning and at bedtime.    [provider]  levothyroxine (SYNTHROID) 100 MCG tablet Take 100 mcg by mouth daily before breakfast.    [provider]  midodrine (PROAMATINE) 5 MG tablet Take 5 mg by mouth See admin instructions. Take 5 mg by mouth in the morning as directed on Tues/Thurs/Sat    [provider]  omeprazole (PRILOSEC) 20 MG capsule Take 20 mg by mouth in the morning.    [provider]  OXYGEN Inhale 2 L/min into the lungs as needed (for a saturation less than 90%).    [provider]  senna-docusate (SENOKOT-S) 8.6-50 MG tablet Take 1 tablet by mouth at bedtime as needed for mild constipation. 06/11/18   Regalado, [name], MD  sevelamer carbonate (RENVELA) 800 MG tablet Take 800 mg by mouth 3 (three) times daily with meals.    [provider]      Allergies    Hydrocodone    Review of Systems   Review of Systems  Constitutional:  Positive for fatigue. Negative for chills, diaphoresis and fever.  HENT:  Negative for congestion.   Respiratory:  Positive for shortness of breath. Negative for cough, chest tightness and wheezing.   Cardiovascular:  Negative for chest pain and palpitations.  Gastrointestinal:  Negative for constipation, diarrhea, nausea and vomiting.  Musculoskeletal:  Negative for back pain.  Neurological:  Positive for light-headedness. Negative for weakness, numbness and headaches.  Psychiatric/Behavioral:  Negative for agitation and confusion.   All other systems reviewed and are negative.   Physical Exam Updated Vital Signs LMP 03/06/2013 (LMP Unknown)  Physical Exam Vitals and nursing note  reviewed.  Constitutional:      General: [name] Michael is in acute distress.     Appearance: [name] Michael is well-developed. [name] Michael is ill-appearing.  HENT:     Head: Normocephalic and atraumatic.     Mouth/Throat:     Mouth: Mucous membranes are moist.     Pharynx: No oropharyngeal exudate or posterior oropharyngeal erythema.  Eyes:     Extraocular Movements: Extraocular movements intact.     Conjunctiva/sclera: Conjunctivae normal.     Pupils: Pupils are equal, round, and reactive to light.  Cardiovascular:     Rate and Rhythm: Bradycardia present. Rhythm irregular.     Heart sounds: Murmur heard.  Pulmonary:     Effort: Pulmonary effort is normal. No respiratory distress.     Breath sounds: Rales present. No wheezing or rhonchi.  Chest:     Chest wall: No tenderness.  Abdominal:     Palpations: Abdomen is soft.     Tenderness: There is no abdominal tenderness. There is no guarding or rebound.  Musculoskeletal:        General: No swelling or tenderness.     Cervical back: Neck supple.     Right lower leg: No edema.     Left lower leg: No edema.  Skin:    General: Skin is warm and dry.     Capillary Refill: Capillary refill takes less than 2 seconds.     Findings: No erythema.  Neurological:     General: No focal deficit present.  Psychiatric:        Mood and Affect: Mood normal.     ED Results / Procedures / Treatments   Labs (all labs ordered are listed, but only abnormal results are displayed) Labs Reviewed  CBC WITH DIFFERENTIAL/PLATELET - Abnormal; Notable for the following components:      Result Value   RDW 18.3 (*)    All other components within normal limits  COMPREHENSIVE METABOLIC PANEL - Abnormal; Notable for the following components:   Sodium 126 (*)    Potassium 6.7 (*)    Chloride 91 (*)    CO2 19 (*)    Glucose, Bld 216 (*)    BUN 44 (*)    Creatinine, Ser 5.93 (*)    Calcium 8.6 (*)    Albumin 3.4 (*)    GFR, Estimated 8 (*)    Anion gap 16 (*)    All other  components within normal limits  BRAIN NATRIURETIC PEPTIDE - Abnormal; Notable for the following components:   B Natriuretic Peptide 1,274.1 (*)    All other components within normal limits  GLUCOSE, CAPILLARY - Abnormal; Notable for the following components:   Glucose-Capillary 181 (*)    All other components within normal limits  I-STAT CHEM 8, ED - Abnormal; Notable for the following components:   Sodium 125 (*)    Potassium 6.8 (*)    Chloride 96 (*)    BUN 45 (*)    Creatinine, Ser 6.70 (*)  Glucose, Bld 215 (*)    Calcium, Ion 1.07 (*)    TCO2 21 (*)    All other components within normal limits  RESP PANEL BY RT-PCR (RSV, FLU A&B, COVID)  RVPGX2  MRSA NEXT GEN BY PCR, NASAL  MAGNESIUM  LIPASE, BLOOD  LACTIC ACID, PLASMA  HEPATITIS B SURFACE ANTIBODY, QUANTITATIVE  HEPATITIS B SURFACE ANTIGEN  TSH  T4, FREE  LACTIC ACID, PLASMA  TROPONIN I (HIGH SENSITIVITY)  TROPONIN I (HIGH SENSITIVITY)    EKG EKG Interpretation Date/Time:  Tuesday March 03 2023 11:23:27 EDT Ventricular Rate:  76 PR Interval:  149 QRS Duration:  144 QT Interval:  398 QTC Calculation: 448 R Axis:   158  Text Interpretation: Sinus arrhythmia Right bundle branch block Lateral infarct, old when compared to prior, less aritifact from pacing but sharp t waves present. No STEMI Confirmed by Theda Belfast ([id number]) on 03/03/2023 11:51:35 AM  Radiology DG Chest Portable 1 View  Result Date: 03/03/2023 CLINICAL DATA:  Hypoxic respiratory failure. EXAM: PORTABLE CHEST 1 VIEW COMPARISON:  Chest radiographs 12/06/2022. FINDINGS: Low lung volumes emphasize the pulmonary vasculature and baseline cardiomegaly. No consolidation or pulmonary edema. No pleural effusion or pneumothorax. IMPRESSION: Low lung volumes without evidence of acute cardiopulmonary disease. Cardiomegaly. Electronically Signed   By: Orvan Falconer M.D.   On: 03/03/2023 14:28    Procedures Procedures    CRITICAL CARE Performed by:  Canary Brim Prateek Knipple Total critical care time: 50 minutes Critical care time was exclusive of separately billable procedures and treating other patients. Critical care was necessary to treat or prevent imminent or life-threatening deterioration. Critical care was time spent personally by me on the following activities: development of treatment plan with patient and/or surrogate as well as nursing, discussions with consultants, evaluation of patient's response to treatment, examination of patient, obtaining history from patient or surrogate, ordering and performing treatments and interventions, ordering and review of laboratory studies, ordering and review of radiographic studies, pulse oximetry and re-evaluation of patient's condition.  Medications Ordered in ED Medications  Chlorhexidine Gluconate Cloth 2 % PADS 6 each (has no administration in time range)  midazolam (VERSED) injection 1 mg (has no administration in time range)  calcium gluconate 1 g/ 50 mL sodium chloride IVPB (has no administration in time range)  EPINEPHrine (ADRENALIN) 5 mg in NS 250 mL (0.02 mg/mL) premix infusion (has no administration in time range)  midazolam (VERSED) 2 MG/2ML injection (has no administration in time range)  insulin aspart (novoLOG) injection 0-9 Units (has no administration in time range)  heparin injection 5,000 Units (has no administration in time range)  insulin aspart (novoLOG) injection 5 Units (5 Units Subcutaneous Given 03/03/23 1113)  calcium gluconate inj 10% (1 g) URGENT USE ONLY! (1 g Intravenous Given 03/03/23 1141)  albuterol (PROVENTIL) (2.5 MG/3ML) 0.083% nebulizer solution 10 mg (2.5 mg Nebulization Given 03/03/23 1149)  midazolam (VERSED) injection 2 mg (2 mg Intravenous Given 03/03/23 1306)  morphine (PF) 2 MG/ML injection 2 mg (2 mg Intravenous Given 03/03/23 1411)    ED Course/ Medical Decision Making/ A&P                                 Medical Decision Making Amount and/or  Complexity of Data Reviewed Labs: ordered. Radiology: ordered.  Risk OTC drugs. Prescription drug management. Decision regarding hospitalization.     VYOLETTE MELQUIST is a 62 y.o. female with a complex past  medical history including hypertension, diabetes, hyperlipidemia, ESRD on dialysis, CHF, GERD, previous TIA, and osteoporosis who presents for hypoxia, hypotension, altered mental status, and significant bradycardia.  According to EMS, patient was found this morning altered, hypoxic, hypotensive.  When they arrived patient was answering some questions but then began to worsen.  Patient was found to have a heart rate in the 20s with what appeared to be a complete heart block or escape rhythm.  They started pacing the patient and mental status and blood pressure improved.  Glucose was reportedly in the 200s so not critically elevated.  Patient then arrived still being paced but answering some questions.  On arrival, airway is intact.  Breath sounds are coarse bilaterally but patient is not complaining of chest pain.  Abdomen was nontender.  [name] Michael was being paced.  [name] Michael was moving extremities.  Legs were nontender nonedematous.  Patient was moved over to our pacer and [name] Michael started to have some discomfort in her back from the anterior posterior pacing.  Underlying rhythm did appear to be a complete heart block when not pacing.  Cardiology quickly called who came to the bedside and he did not.  Clinically we are concerned about possible hyperkalemia given her ESRD history and appearance of the EKG.  [name] Michael was given some insulin initially and cardiology help direct atropine followed by epinephrine dose followed by epi drip.  [name] Michael is now on the epi drip and heart rate is now in the 80s and [name] Michael is not being paced.  [name] Michael is still mentating well.  I-STAT labs began to return and patient does have hyperkalemia in the sixes as well as elevated creatinine in the sixes.  I quickly called both critical care and  nephrology to come assist in management.  [name] Michael will get other labs, chest x-ray, and COVID swab however [name] Michael will need admission for suspected symptomatic bradycardia complete heart block in the setting of hyperkalemia and ESRD.  Patient will be admitted for further management.         Final Clinical Impression(s) / ED Diagnoses Final diagnoses:  Hyperkalemia    Clinical Impression: 1. Hyperkalemia     Disposition: Admit  This note was prepared with assistance of Dragon voice recognition software. Occasional wrong-word or sound-a-like substitutions may have occurred due to the inherent limitations of voice recognition software.      Guido Comp, Canary Brim, MD 03/03/23 1534

## 2023-03-03 NOTE — Consult Note (Signed)
[facility] Renal Consultation Note    Indication for Consultation:  Management of ESRD/hemodialysis, anemia, hypertension/volume, and secondary hyperparathyroidism.  HPI: [name] Michael is a 62 y.o. female with PMH including ESRD on dialysis TTS, HTN, prior CVA/TIA, T2Dm who presented to the ED from her SNF. Her SNF reported she was cool, clammy and they had difficulty palpating a radial pulse. EMS noted BP 90/32, HR in the 20's. On arrival she was bradycardic, hypotensive and hypoxic. EKG was reportedly consistently with complete heart block and external passing was started. Cardiology was consulted and she was given atropine and an amp of epi. Initially converted to NSR in the 60s-80s then became bradycardic again. Istat K+ 6.8 and nephrology was consulted. At time of our exam, she remained bradycardic and hypoxic whenever external passing was stopped. She had been given insulin, calcium gluconate was delayed due to lack of central access (was being used for epi). After she was given calcium gluconate and albuterol, HR improved to the 50's. She was given 1mg  versed to help with restlessness during pacing/HD. Critical care was consulted for ICU admission. CBC, CBC and CXR are pending.   Patient is a poor historian. She is alert and restless, vocalizing in pain, but is unable to answer ROS questions. Reportedly she told staff at her SNF that sh was feeling generally unwell today. Outpatient dialysis records reviewed. Last HD was 02/28/23. She has not missed any dialysis recently. She does have intermittent hyperkalemia and last potassium was 5.8 on 02/24/23.Of note, she has also been leaving around 4kg over her EDW lately. CXR here pending.   Past Medical History:  Diagnosis Date   Ambulates with cane    Constipation    Depression    ESRD (end stage renal disease) (HCC)    a. TTS Dialysis.   GERD (gastroesophageal reflux disease)    History of stress test    a. 01/2003 MV: EF 74%, no  ischemia/infarct.   Hyperlipidemia    Hypertension    Osteoporosis    Stroke [facility]) 04-01-11   left frontal subcortical, saw Dr. Pearlean Brownie    Syncope 11/2019   TIA (transient ischemic attack) 03-12-11   Tobacco abuse    Tricuspid regurgitation    a. 05/2016 Echo: EF 65-70%, Gr2DD, mild MR, nl RV fxn, Triv TR, PASP ; b. 05/2018 Echo: EF 60-65%, Gr2DD, mild MR/TR, RVSP/PASP ; c. 11/2019 Echo: EF 55-60%, no rwma, mild-mod MR, Sev TR w/ RV dilatation (CTA chest neg for PE).   Type II diabetes mellitus (HCC)    Vitamin D deficiency    Past Surgical History:  Procedure Laterality Date   AV FISTULA PLACEMENT Left 02/21/2019   Procedure: BRACHIOCEPHALIC ARTERIOVENOUS (AV) FISTULA CREATION;  Surgeon: Cephus Shelling, MD;  Location: [facility] OR;  Service: Vascular;  Laterality: Left;   BASCILIC VEIN TRANSPOSITION Left 04/11/2019   Procedure: SECOND STAGE BASILIC VEIN TRANSPOSITION LEFT ARM;  Surgeon: Cephus Shelling, MD;  Location: [facility] OR;  Service: Vascular;  Laterality: Left;   IR FLUORO GUIDE CV LINE RIGHT  04/29/2019   IR PARACENTESIS  02/20/2021   IR US GUIDE VASC ACCESS RIGHT  04/29/2019   OPEN REDUCTION INTERNAL FIXATION (ORIF) DISTAL RADIAL FRACTURE Left 01/28/2016   Procedure: OPEN REDUCTION INTERNAL FIXATION (ORIF) DISTAL RADIAL FRACTURE;  Surgeon: Bradly Bienenstock, MD;  Location: [facility] OR;  Service: Orthopedics;  Laterality: Left;   REFRACTIVE SURGERY     [facility]   RIGHT HEART CATH N/A 12/05/2019   Procedure:  RIGHT HEART CATH;  Surgeon: Yvonne Kendall, MD;  Location: [facility] INVASIVE CV LAB;  Service: Cardiovascular;  Laterality: N/A;   TONSILLECTOMY AND ADENOIDECTOMY     age 62   Family History  Problem Relation Age of Onset   Stroke Mother    Diabetes Mother    Kidney failure Mother    Heart failure Mother    Stroke Father    Cancer Sister        Breast- 67's   Colon cancer Maternal Grandmother    Pancreatic cancer Neg Hx    Esophageal cancer Neg Hx    Liver disease  Neg Hx    Stomach cancer Neg Hx    Social History:  reports that she has been smoking cigarettes. She started smoking about 42 years ago. She has a 9.6 pack-year smoking history. She has been exposed to tobacco smoke. She has never used smokeless tobacco. She reports that she does not drink alcohol and does not use drugs.  ROS: As per HPI otherwise negative.  Physical Exam: Vitals:   03/03/23 1045 03/03/23 1230  BP: (!) 105/90   Pulse:  (!) 38  SpO2: (!) 60%      General: Alert, restless female Head: Normocephalic, atraumatic, sclera non-icteric, mucus membranes are moist. Lungs: Exam limited, lungs CTA anteriorly Heart: Bradycardic rate, regular rhythm. No murmur auscultated Abdomen: Soft, nnon-distended with normoactive bowel sounds. No rebound/guarding. No obvious abdominal masses. Lower extremities: No edema b/l lower extremities. Neuro: Alert. Moves all extremities spontaneously. Dialysis Access: [name] AVF  + t/b  Allergies  Allergen Reactions   Hydrocodone Nausea And Vomiting and Other (See Comments)    "Allergic," per [facility]   Prior to Admission medications   Medication Sig Start Date End Date Taking? Authorizing Provider  acetaminophen (TYLENOL) 500 MG tablet Take 500 mg by mouth every 6 (six) hours as needed (for pain).   Yes [provider]  albuterol (VENTOLIN HFA) 108 (90 Base) MCG/ACT inhaler Inhale 1 puff into the lungs every 6 (six) hours as needed for wheezing or shortness of breath.   Yes [provider]  amLODipine (NORVASC) 10 MG tablet Take 1 tablet (10 mg total) by mouth daily. 12/08/22  Yes Burnadette Pop, MD  aspirin 81 MG chewable tablet Chew 81 mg by mouth daily.   Yes [provider]  atorvastatin (LIPITOR) 20 MG tablet Take 20 mg by mouth every evening.   Yes [provider]  b complex-vitamin c-folic acid (NEPHRO-VITE) 0.8 MG TABS tablet Take 1 tablet by mouth at bedtime.   Yes [provider]  carvedilol (COREG)  12.5 MG tablet Take 1 tablet (12.5 mg total) by mouth 2 (two) times daily with a meal. 12/07/22 03/03/23 Yes Adhikari, Willia Craze, MD  chlorhexidine (PERIDEX) 0.12 % solution Use as directed 15 mLs in the mouth or throat 2 (two) times daily. 11/29/22  Yes Palumbo, April, MD  cloNIDine (CATAPRES) 0.1 MG tablet Take 0.1 mg by mouth every 8 (eight) hours as needed (for a B/P reading greater than 160/90).   Yes [provider]  fish oil-omega-3 fatty acids 1000 MG capsule Take 1 g by mouth in the morning and at bedtime.   Yes [provider]  levothyroxine (SYNTHROID) 125 MCG tablet Take 125 mcg by mouth daily before breakfast.   Yes [provider]  midodrine (PROAMATINE) 5 MG tablet Take 5 mg by mouth See admin instructions. Take 5 mg by mouth in the morning as directed on Tues/Thurs/Sat   Yes  [provider]  omeprazole (PRILOSEC) 20 MG capsule Take 20 mg by mouth in the morning.   Yes [provider]  OXYGEN Inhale 2 L/min into the lungs as needed (for a saturation less than 90%).   Yes [provider]  Pramoxine-Calamine (AVEENO ANTI-ITCH EX) Apply 1 application  topically in the morning and at bedtime. Apply to back   Yes [provider]  senna-docusate (SENOKOT-S) 8.6-50 MG tablet Take 1 tablet by mouth at bedtime as needed for mild constipation. Patient taking differently: Take 1 tablet by mouth daily as needed for mild constipation. 06/11/18  Yes Regalado, [name], MD  sevelamer carbonate (RENVELA) 800 MG tablet Take 800 mg by mouth 3 (three) times daily with meals.   Yes [provider]   Current Facility-Administered Medications  Medication Dose Route Frequency Provider Last Rate Last Admin   calcium gluconate 1 g/ 50 mL sodium chloride IVPB  1 g Intravenous Once Champ Mungo, DO       Chlorhexidine Gluconate Cloth 2 % PADS 6 each  6 each Topical Q0600 Allexis Bordenave G, PA-C       insulin aspart (novoLOG) injection 5 Units  5  Units Intravenous Once Tegeler, Canary Brim, MD       And   dextrose 50 % solution 50 mL  1 ampule Intravenous Once Tegeler, Canary Brim, MD       midazolam (VERSED) injection 1 mg  1 mg Intravenous Once Cheri Fowler, MD       Current Outpatient Medications  Medication Sig Dispense Refill   acetaminophen (TYLENOL) 500 MG tablet Take 500 mg by mouth every 6 (six) hours as needed (for pain).     albuterol (VENTOLIN HFA) 108 (90 Base) MCG/ACT inhaler Inhale 1 puff into the lungs every 6 (six) hours as needed for wheezing or shortness of breath.     amLODipine (NORVASC) 10 MG tablet Take 1 tablet (10 mg total) by mouth daily. 30 tablet 1   aspirin 81 MG chewable tablet Chew 81 mg by mouth daily.     atorvastatin (LIPITOR) 20 MG tablet Take 20 mg by mouth every evening.     b complex-vitamin c-folic acid (NEPHRO-VITE) 0.8 MG TABS tablet Take 1 tablet by mouth at bedtime.     carvedilol (COREG) 12.5 MG tablet Take 1 tablet (12.5 mg total) by mouth 2 (two) times daily with a meal. 60 tablet 1   chlorhexidine (PERIDEX) 0.12 % solution Use as directed 15 mLs in the mouth or throat 2 (two) times daily. 120 mL 0   cloNIDine (CATAPRES) 0.1 MG tablet Take 0.1 mg by mouth every 8 (eight) hours as needed (for a B/P reading greater than 160/90).     fish oil-omega-3 fatty acids 1000 MG capsule Take 1 g by mouth in the morning and at bedtime.     levothyroxine (SYNTHROID) 125 MCG tablet Take 125 mcg by mouth daily before breakfast.     midodrine (PROAMATINE) 5 MG tablet Take 5 mg by mouth See admin instructions. Take 5 mg by mouth in the morning as directed on Tues/Thurs/Sat     omeprazole (PRILOSEC) 20 MG capsule Take 20 mg by mouth in the morning.     OXYGEN Inhale 2 L/min into the lungs as needed (for a saturation less than 90%).     Pramoxine-Calamine (AVEENO ANTI-ITCH EX) Apply 1 application  topically in the morning and at bedtime. Apply to back     senna-docusate (SENOKOT-S) 8.6-50 MG tablet  Take 1  tablet by mouth at bedtime as needed for mild constipation. (Patient taking differently: Take 1 tablet by mouth daily as needed for mild constipation.) 30 tablet 0   sevelamer carbonate (RENVELA) 800 MG tablet Take 800 mg by mouth 3 (three) times daily with meals.     Labs: Basic Metabolic Panel: Recent Labs  Lab 03/03/23 1113  NA 125*  K 6.8*  CL 96*  GLUCOSE 215*  BUN 45*  CREATININE 6.70*   CBC: Recent Labs  Lab 03/03/23 1107 03/03/23 1113  WBC 8.8  --   NEUTROABS 6.8  --   HGB 12.4 13.9  HCT 39.6 41.0  MCV 94.3  --   PLT 235  --     Dialysis Orders:  Center: [facility]  on TTS . 160NRe 3 hr 45 min BFR 350 DFR 500 EDW 33kg 2K 2Ca AVF 16g No heparin No ESA Hectorol IV q HD Phos binder:  renvela 800mg  1 tab per meal   Assessment/Plan:  Bradycardia: Likely secondary to hyperkalemia, K+ 6.8. Given insulin, albuterol and calcium gluconate. HD RN is aware of patient and will start HD once she is placed in an ICU room.  Hyperkalemia: Compliant with HD but K+ running high lately, likely diet related. Will need to reinforce low K+ diet once patient is stable.   ESRD:  On TTS schedule, emergent HD, see above.   Hypertension/volume: BP elevated. No overt volume overload on exam but has been consistently ~4kg over EDW lately. CXR pending. O2 sat 100% when externally passed. No UF today due to instability, will work on titrating volume down when able.   Anemia: Hgb >12, no ESA indicated  Metabolic bone disease: Labs pending, will follow  Nutrition:  currently NPO  Rogers Blocker, PA-C 03/03/2023, 12:31 PM  [facility] Pager: [phone]

## 2023-03-04 DIAGNOSIS — E875 Hyperkalemia: Secondary | ICD-10-CM | POA: Diagnosis not present

## 2023-03-04 DIAGNOSIS — G9340 Encephalopathy, unspecified: Secondary | ICD-10-CM | POA: Diagnosis not present

## 2023-03-04 DIAGNOSIS — N186 End stage renal disease: Secondary | ICD-10-CM | POA: Diagnosis not present

## 2023-03-04 DIAGNOSIS — R001 Bradycardia, unspecified: Secondary | ICD-10-CM | POA: Diagnosis not present

## 2023-03-04 LAB — CBC
HCT: 29.7 % — ABNORMAL LOW (ref 36.0–46.0)
Hemoglobin: 9.4 g/dL — ABNORMAL LOW (ref 12.0–15.0)
MCH: 28.7 pg (ref 26.0–34.0)
MCHC: 31.6 g/dL (ref 30.0–36.0)
MCV: 90.8 fL (ref 80.0–100.0)
Platelets: 160 10*3/uL (ref 150–400)
RBC: 3.27 MIL/uL — ABNORMAL LOW (ref 3.87–5.11)
RDW: 17.7 % — ABNORMAL HIGH (ref 11.5–15.5)
WBC: 6.2 10*3/uL (ref 4.0–10.5)
nRBC: 0 % (ref 0.0–0.2)

## 2023-03-04 LAB — HEPATITIS B SURFACE ANTIBODY, QUANTITATIVE: Hep B S AB Quant (Post): 11.7 m[IU]/mL

## 2023-03-04 LAB — COMPREHENSIVE METABOLIC PANEL
ALT: 17 U/L (ref 0–44)
AST: 22 U/L (ref 15–41)
Albumin: 3.1 g/dL — ABNORMAL LOW (ref 3.5–5.0)
Alkaline Phosphatase: 96 U/L (ref 38–126)
Anion gap: 12 (ref 5–15)
BUN: 9 mg/dL (ref 8–23)
CO2: 25 mmol/L (ref 22–32)
Calcium: 8.3 mg/dL — ABNORMAL LOW (ref 8.9–10.3)
Chloride: 92 mmol/L — ABNORMAL LOW (ref 98–111)
Creatinine, Ser: 2.67 mg/dL — ABNORMAL HIGH (ref 0.44–1.00)
GFR, Estimated: 20 mL/min — ABNORMAL LOW (ref >60)
Glucose, Bld: 144 mg/dL — ABNORMAL HIGH (ref 70–99)
Potassium: 4.1 mmol/L (ref 3.5–5.1)
Sodium: 129 mmol/L — ABNORMAL LOW (ref 135–145)
Total Bilirubin: 0.5 mg/dL (ref 0.3–1.2)
Total Protein: 6 g/dL — ABNORMAL LOW (ref 6.5–8.1)

## 2023-03-04 LAB — GLUCOSE, CAPILLARY
Glucose-Capillary: 118 mg/dL — ABNORMAL HIGH (ref 70–99)
Glucose-Capillary: 133 mg/dL — ABNORMAL HIGH (ref 70–99)
Glucose-Capillary: 96 mg/dL (ref 70–99)
Glucose-Capillary: 107 mg/dL — ABNORMAL HIGH (ref 70–99)
Glucose-Capillary: 76 mg/dL (ref 70–99)

## 2023-03-04 LAB — MAGNESIUM: Magnesium: 1.4 mg/dL — ABNORMAL LOW (ref 1.7–2.4)

## 2023-03-04 MED ORDER — DOXERCALCIFEROL 4 MCG/2ML IV SOLN
1.0000 ug | INTRAVENOUS | Status: DC
  Administered 2023-03-05: 1 ug via INTRAVENOUS
  Filled 2023-03-04 (×2): qty 2

## 2023-03-04 MED ORDER — AMLODIPINE BESYLATE 10 MG PO TABS
10.0000 mg | ORAL_TABLET | Freq: Every day | ORAL | Status: DC
  Administered 2023-03-04 – 2023-03-06 (×3): 10 mg via ORAL
  Filled 2023-03-04 (×3): qty 1

## 2023-03-04 MED ORDER — ATORVASTATIN CALCIUM 10 MG PO TABS
20.0000 mg | ORAL_TABLET | Freq: Every day | ORAL | Status: DC
  Administered 2023-03-04 – 2023-03-06 (×3): 20 mg via ORAL
  Filled 2023-03-04 (×3): qty 2

## 2023-03-04 MED ORDER — HYDRALAZINE HCL 20 MG/ML IJ SOLN: 10.0000 mg | Freq: Four times a day (QID) | INTRAMUSCULAR | Status: DC | PRN

## 2023-03-04 MED ORDER — ASPIRIN 81 MG PO TBEC
81.0000 mg | DELAYED_RELEASE_TABLET | Freq: Every day | ORAL | Status: DC
  Administered 2023-03-04 – 2023-03-06 (×3): 81 mg via ORAL
  Filled 2023-03-04 (×3): qty 1

## 2023-03-04 MED ORDER — METOCLOPRAMIDE HCL 5 MG/ML IJ SOLN: 5.0000 mg | Freq: Four times a day (QID) | INTRAMUSCULAR | Status: DC | PRN

## 2023-03-04 NOTE — Plan of Care (Signed)

## 2023-03-04 NOTE — Progress Notes (Signed)
Admit: 03/03/2023 LOS: 1  29F ESRD THS [facility] presenting with AMS, severe bradycardia, hyperkalemia  Subjective:  Tolerating iHD using AVF yesterday Remains externally paced  Post HD K was improved, AM Labs pending.  Not on Epi  09/10 0701 - 09/11 0700 In: 459.4 [I.V.:459.4] Out: 0   There were no vitals filed for this visit.  Scheduled Meds:  amLODipine  10 mg Oral Daily   aspirin EC  81 mg Oral Daily   atorvastatin  20 mg Oral Daily   Chlorhexidine Gluconate Cloth  6 each Topical Q0600   heparin  5,000 Units Subcutaneous Q8H   insulin aspart  0-9 Units Subcutaneous TID WC   mouth rinse  15 mL Mouth Rinse 4 times per day   Continuous Infusions: PRN Meds:.hydrALAZINE, morphine injection, mouth rinse  Current Labs: reviewed    Physical Exam:  Blood pressure (!) 172/60, pulse 76, temperature 98.6 F (37 C), temperature source Axillary, resp. rate (!) 22, last menstrual period 03/06/2013, SpO2 97%. Somnolent, twitching from the pacing CTAB Pacer pads on [name] AVF +B/T No LEE  Dialysis Orders:  Center: [facility]  on TTS . 160NRe 3 hr 45 min BFR 350 DFR 500 EDW 33kg 2K 2Ca AVF 16g No heparin No ESA Hectorol IV q HD Phos binder:  renvela 800mg  1 tab per meal  A ESRD on THS schedule Hyperkalemia, resolved Persistent bradycardia req external packing, cardiology following HTN/Vol: BPs ok, no UF yesterday with HD Anemia: Hb dropped to 9.4, not on ESA as outpt.  Probably some ABLA from difficult cannulation 9/10, trend fornow CKD-BMD: Cont VDRA and BInders as able  P HD tomorrow: AVF, 3h, 1-2L UF, K per protocol, no heparin ? Pacing plans, per cardiology Medication Issues; Preferred narcotic agents for pain control are hydromorphone, fentanyl, and methadone. Morphine should not be used.  Baclofen should be avoided Avoid oral sodium phosphate and magnesium citrate based laxatives / bowel preps    Sabra Heck MD 03/04/2023, 9:23 AM  Recent Labs  Lab 03/03/23 1107  03/03/23 1113 03/03/23 1843  NA 126* 125* 132*  K 6.7* 6.8* 3.0*  CL 91* 96* 89*  CO2 19*  --  27  GLUCOSE 216* 215* 70  BUN 44* 45* 5*  CREATININE 5.93* 6.70* 1.14*  CALCIUM 8.6*  --  8.1*   Recent Labs  Lab 03/03/23 1107 03/03/23 1113 03/04/23 0753  WBC 8.8  --  6.2  NEUTROABS 6.8  --   --   HGB 12.4 13.9 9.4*  HCT 39.6 41.0 29.7*  MCV 94.3  --  90.8  PLT 235  --  160

## 2023-03-04 NOTE — Progress Notes (Addendum)
NAME:  [name] Michael, MRN:  [id number], DOB:  06-01-1961, LOS: 1 ADMISSION DATE:  03/03/2023, CONSULTATION DATE:  03/03/2023 REFERRING MD:  Rush Landmark, MD, CHIEF COMPLAINT:  symptomatic bradycardia, hyperkalemia   History of Present Illness:  [name] Michael is a 62 y.o. F with PMH ESRD on HD TTS, chronic HFpEF, hypothyroidism, insulin-dependent diabetes mellitus, anemia of chronic kidney disease, GERD, HLD, prior TIA who was BIB EMS from [facility] where she was reportedly not feeling well. Staff at facility noted patient was cool and clammy and they were unable to palpate a radial pulse. Manual BP per EMS was 90/32.  Rhythm noted by EMS was HR 20's either complete heart block or escape rhythm. They began pacing at that time and brought her to [facility] ED. On arrival she was hypoxic, hypotensive, and altered with significant bradycardia. Underlying rhythm when not being paced was consistent with complete heart block per EDP. Istat showed hyperkalemia to 6.8. Cardiology assessed at bedside and she was given insulin, atropine, epinephrine load followed by infusion. She continues to be paced and on epinephrine infusion. PCCM consulted for admission to ICU in setting of symptomatic bradycardia requiring external pacing, hyperkalemia requiring emergent HD, and persistent epinephrine drip.  Pertinent  Medical History  ESRD on HD TTS, chronic HFpEF, hypothyroidism, insulin-dependent diabetes mellitus, anemia of chronic kidney disease, GERD, HLD, prior CVA, prior TIA, osteoporosis  Significant Hospital Events: Including procedures, antibiotic start and stop dates in addition to other pertinent events   09/10 Admitted to ICU  Interim History / Subjective:  Underwent emergent HD yesterday. Had significant blood loss from AVF. Emergent CT head due to encephalopathy was negative for acute abnormalities. Off epinephrine infusion.   Objective   Blood pressure (!) 173/72, pulse 80, temperature 98.6 F (37 C),  temperature source Axillary, resp. rate (!) 22, last menstrual period 03/06/2013, SpO2 99%.        Intake/Output Summary (Last 24 hours) at 03/04/2023 0736 Last data filed at 03/04/2023 0700 Gross per 24 hour  Intake 459.4 ml  Output 0 ml  Net 459.4 ml   There were no vitals filed for this visit.  Examination: Constitutional:Chronically ill appearing female, comfortably laying in bed. No acute distress.  Cardio:Regular rate and rhythm while off external pacer. Pulm:Clear to auscultation bilaterally. Normal work of breathing on room air. Abdomen:Soft, distended, nontender. NWG:NFAOZHYQ for extremity edema. Skin:Warm and dry. Neuro:Awake, alert, oriented x4. No focal deficit noted.  Labs: CBC: WBC 6.2, Hgb 9.4 CMP: Mg:  Imaging: CXR: Low lung volumes without evidence of acute cardiopulmonary disease. Cardiomegaly.  CT head: 1. No acute intracranial abnormality. 2. 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 Chronic small vessel ischemia.  Resolved Hospital Problem list   Acute hypoxic/hypercapnic respiratory failure Hyperkalemia  Assessment & Plan:    Symptomatic bradycardia 2/2 hyperkalemia External pacer turned off, patient with underly rate of 80 BPM and regular appearing rhythm. S/p emergent HD for hyperkalemia 09/10. Troponins WNL, no lactic acidosis. Plan: -Remain off external pacer as able, cardiology recommendations pending -Continue telemetry -Trend electrolytes, replete as indicated -Morphine 2 mg q2h PRN moderate, severe pain  ESRD on HD TTSa Hypervolemic hyponatremia S/p emergent HD 09/10, on schedule. Plan: -HD per nephrology  Acute encephalopathy 2/2 uremia CT head without acute intracranial abnormalities. Is awake, alert, oriented x4 today. Plan: -Avoid sedation as able  Hypothyroidism TSH 2.464, T4 1.15. OP regimen is levothyroxine 125 mcg daily. Possibly subclinical hyperthyroidism versus need for decreased levothyroxine  dose.  Plan: -Would consider decreasing dose of levoythyroxine as OP -Recommend repeat thyroid studies after acute illness resolves  T2DM Last HbA1c 06/2022 5.7%. Plan: -SSI, CBG goal 140-180  Anemia of chronic kidney disease Hgb stable at 9.4, had large volume blood loss from AVF during HD yesterday. Plan: -Trend CBC, transfuse for Hgb less than 7  Hx CVA Hx TIA HLD On ASA 81 mg daily, atorvastatin 20 mg daily as OP.  Plan: -Resume home ASA and atorvastatin today  Hx HTN OP regimen includes amlodipine 10 mg daily, carvedilol 12.5 mg BID. Plan: -Resume amlodipine, continue to hold carvedilol  HFpEF w/ cor pulomonale BNP 1274.1. Not on OP diuretics that I can see. Last echocardiogram 06/2022 showed LV EF 60-65% with normal function and no RWMA but evidence of increased RV pressure and volume overload with severely enlarged RV, mildly dilated LA, severely dilated RA, severe tricuspid regurgitation. Plan: -Consider repeat echocardiogram pending results of troponin values  Goals of Care Patient confirmed she wishes to be FULL CODE despite prior wishes to be DNR.  Best Practice (right click and "Reselect all SmartList Selections" daily)   Diet/type: Regular consistency DVT prophylaxis: prophylactic heparin  GI prophylaxis: N/A Lines: N/A Foley:  N/A Code Status:  FULL CODE Last date of multidisciplinary goals of care discussion [09/10]  Labs   CBC: Recent Labs  Lab 03/03/23 1107 03/03/23 1113  WBC 8.8  --   NEUTROABS 6.8  --   HGB 12.4 13.9  HCT 39.6 41.0  MCV 94.3  --   PLT 235  --     Basic Metabolic Panel: Recent Labs  Lab 03/03/23 1107 03/03/23 1113 03/03/23 1843  NA 126* 125* 132*  K 6.7* 6.8* 3.0*  CL 91* 96* 89*  CO2 19*  --  27  GLUCOSE 216* 215* 70  BUN 44* 45* 5*  CREATININE 5.93* 6.70* 1.14*  CALCIUM 8.6*  --  8.1*  MG 2.2  --   --    GFR: CrCl cannot be calculated (Unknown ideal weight.). Recent Labs  Lab 03/03/23 1107  03/03/23 1405 03/03/23 1542  WBC 8.8  --   --   LATICACIDVEN  --  1.5 0.7    Liver Function Tests: Recent Labs  Lab 03/03/23 1107  AST 20  ALT 16  ALKPHOS 108  BILITOT 0.8  PROT 6.7  ALBUMIN 3.4*   Recent Labs  Lab 03/03/23 1107  LIPASE 31   No results for input(s): "AMMONIA" in the last 168 hours.  ABG    Component Value Date/Time   PHART 7.439 04/09/2021 1346   PCO2ART 46.9 04/09/2021 1346   PO2ART 60 (L) 04/09/2021 1346   HCO3 32.5 (H) 03/03/2023 2216   TCO2 21 (L) 03/03/2023 1113   O2SAT 75.9 03/03/2023 2216     Coagulation Profile: No results for input(s): "INR", "PROTIME" in the last 168 hours.  Cardiac Enzymes: No results for input(s): "CKTOTAL", "CKMB", "CKMBINDEX", "TROPONINI" in the last 168 hours.  HbA1C: Hgb A1c MFr Bld  Date/Time Value Ref Range Status  07/19/2022 01:17 AM 5.7 (H) 4.8 - 5.6 % Final    Comment:    (NOTE) Pre diabetes:          5.7%-6.4%  Diabetes:              >6.4%  Glycemic control for   <7.0% adults with diabetes   10/14/2020 04:26 PM 5.9 (H) 4.8 - 5.6 % Final    Comment:    (NOTE) Pre diabetes:  5.7%-6.4%  Diabetes:              >6.4%  Glycemic control for   <7.0% adults with diabetes     CBG: Recent Labs  Lab 03/03/23 1914 03/03/23 2011 03/03/23 2313 03/04/23 0313 03/04/23 0729  GLUCAP 63* 140* 87 118* 133*    Review of Systems:   Negative unless otherwise stated.  Past Medical History:  She,  has a past medical history of Ambulates with cane, Constipation, Depression, ESRD (end stage renal disease) (HCC), GERD (gastroesophageal reflux disease), History of stress test, Hyperlipidemia, Hypertension, Osteoporosis, Stroke (HCC) (04-01-11), Syncope (11/2019), TIA (transient ischemic attack) (03-12-11), Tobacco abuse, Tricuspid regurgitation, Type II diabetes mellitus (HCC), and Vitamin D deficiency.   Surgical History:   Past Surgical History:  Procedure Laterality Date   AV FISTULA PLACEMENT  Left 02/21/2019   Procedure: BRACHIOCEPHALIC ARTERIOVENOUS (AV) FISTULA CREATION;  Surgeon: Cephus Shelling, MD;  Location: [facility] OR;  Service: Vascular;  Laterality: Left;   BASCILIC VEIN TRANSPOSITION Left 04/11/2019   Procedure: SECOND STAGE BASILIC VEIN TRANSPOSITION LEFT ARM;  Surgeon: Cephus Shelling, MD;  Location: [facility] OR;  Service: Vascular;  Laterality: Left;   IR FLUORO GUIDE CV LINE RIGHT  04/29/2019   IR PARACENTESIS  02/20/2021   IR US GUIDE VASC ACCESS RIGHT  04/29/2019   OPEN REDUCTION INTERNAL FIXATION (ORIF) DISTAL RADIAL FRACTURE Left 01/28/2016   Procedure: OPEN REDUCTION INTERNAL FIXATION (ORIF) DISTAL RADIAL FRACTURE;  Surgeon: Bradly Bienenstock, MD;  Location: [facility] OR;  Service: Orthopedics;  Laterality: Left;   REFRACTIVE SURGERY     [facility]   RIGHT HEART CATH N/A 12/05/2019   Procedure: RIGHT HEART CATH;  Surgeon: Yvonne Kendall, MD;  Location: [facility] INVASIVE CV LAB;  Service: Cardiovascular;  Laterality: N/A;   TONSILLECTOMY AND ADENOIDECTOMY     age 16     Social History:   reports that she has been smoking cigarettes. She started smoking about 42 years ago. She has a 9.6 pack-year smoking history. She has been exposed to tobacco smoke. She has never used smokeless tobacco. She reports that she does not drink alcohol and does not use drugs.   Family History:  Her family history includes Cancer in her sister; Colon cancer in her maternal grandmother; Diabetes in her mother; Heart failure in her mother; Kidney failure in her mother; Stroke in her father and mother. There is no history of Pancreatic cancer, Esophageal cancer, Liver disease, or Stomach cancer.   Allergies Allergies  Allergen Reactions   Hydrocodone Nausea And Vomiting and Other (See Comments)    "Allergic," per [facility]     Home Medications  Prior to Admission medications   Medication Sig Start Date End Date Taking? Authorizing Provider  aspirin 81 MG chewable tablet Chew 81 mg by mouth daily.    Yes [provider]  acetaminophen (TYLENOL) 500 MG tablet Take 500 mg by mouth every 6 (six) hours as needed (for pain).    [provider]  albuterol (VENTOLIN HFA) 108 (90 Base) MCG/ACT inhaler Inhale 1 puff into the lungs every 6 (six) hours as needed for wheezing or shortness of breath.    [provider]  amLODipine (NORVASC) 10 MG tablet Take 1 tablet (10 mg total) by mouth daily. 12/08/22   Burnadette Pop, MD  b complex-vitamin c-folic acid (NEPHRO-VITE) 0.8 MG TABS tablet Take 1 tablet by mouth at bedtime.    [provider]  carvedilol (COREG) 12.5 MG tablet Take 1 tablet (  12.5 mg total) by mouth 2 (two) times daily with a meal. 12/07/22 01/06/23  Burnadette Pop, MD  chlorhexidine (PERIDEX) 0.12 % solution Use as directed 15 mLs in the mouth or throat 2 (two) times daily. Patient not taking: Reported on 12/04/2022 11/29/22   Palumbo, April, MD  cloNIDine (CATAPRES) 0.1 MG tablet Take 0.1 mg by mouth every 8 (eight) hours as needed (for a B/P reading greater than 160/90).    [provider]  fish oil-omega-3 fatty acids 1000 MG capsule Take 1 g by mouth in the morning and at bedtime.    [provider]  levothyroxine (SYNTHROID) 100 MCG tablet Take 100 mcg by mouth daily before breakfast.    [provider]  midodrine (PROAMATINE) 5 MG tablet Take 5 mg by mouth See admin instructions. Take 5 mg by mouth in the morning as directed on Tues/Thurs/Sat    [provider]  omeprazole (PRILOSEC) 20 MG capsule Take 20 mg by mouth in the morning.    [provider]  OXYGEN Inhale 2 L/min into the lungs as needed (for a saturation less than 90%).    [provider]  senna-docusate (SENOKOT-S) 8.6-50 MG tablet Take 1 tablet by mouth at bedtime as needed for mild constipation. 06/11/18   Regalado, [name], MD  sevelamer carbonate (RENVELA) 800 MG tablet Take 800 mg by mouth 3 (three) times daily with meals.     [provider]     Critical care time: 25 minutes       Champ Mungo, DO Internal Medicine PGY-3 03/04/2023

## 2023-03-04 NOTE — Progress Notes (Signed)
 Heart Failure Navigator Progress Note  Assessed for Heart & Vascular TOC clinic readiness.  Patient does not meet criteria due to ESRD and Dialysis.   Navigator will sign off at this time.   Joelene Barriere,RN, BSN,MSN Heart Failure Nurse Navigator. Contact by secure chat only.

## 2023-03-04 NOTE — Progress Notes (Signed)
Cardiologist :  Tenny Craw  Subjective:  Denies SSCP, palpitations or Dyspnea MS improved Not clear why external pacer on last night  She has been in NSR rates 70-80 with chronic RBBB   Objective:  Vitals:   03/04/23 0730 03/04/23 0800 03/04/23 0830 03/04/23 0900  BP: (!) 177/61 (!) 178/58 (!) 172/60 (!) 184/66  Pulse: 79 79 76 78  Resp:    15  Temp: 98.6 F (37 C)     TempSrc: Axillary     SpO2: 99% 97% 97% 97%    Intake/Output from previous day:  Intake/Output Summary (Last 24 hours) at 03/04/2023 1008 Last data filed at 03/04/2023 0929 Gross per 24 hour  Intake 654.19 ml  Output 0 ml  Net 654.19 ml    Physical Exam: Chronically ill female Lungs clear SEM JVP elevated with V wave  Fistual in [name] Abdomen benign No edema  Lab Results: Basic Metabolic Panel: Recent Labs    03/03/23 1107 03/03/23 1113 03/03/23 1843 03/04/23 0753  NA 126*   < > 132* 129*  K 6.7*   < > 3.0* 4.1  CL 91*   < > 89* 92*  CO2 19*  --  27 25  GLUCOSE 216*   < > 70 144*  BUN 44*   < > 5* 9  CREATININE 5.93*   < > 1.14* 2.67*  CALCIUM 8.6*  --  8.1* 8.3*  MG 2.2  --   --  1.4*   < > = values in this interval not displayed.   Liver Function Tests: Recent Labs    03/03/23 1107 03/04/23 0753  AST 20 22  ALT 16 17  ALKPHOS 108 96  BILITOT 0.8 0.5  PROT 6.7 6.0*  ALBUMIN 3.4* 3.1*   Recent Labs    03/03/23 1107  LIPASE 31   CBC: Recent Labs    03/03/23 1107 03/03/23 1113 03/04/23 0753  WBC 8.8  --  6.2  NEUTROABS 6.8  --   --   HGB 12.4 13.9 9.4*  HCT 39.6 41.0 29.7*  MCV 94.3  --  90.8  PLT 235  --  160    Thyroid Function Tests: Recent Labs    03/03/23 1405  TSH 2.464   Imaging: CT HEAD WO CONTRAST ( )  Result Date: 03/03/2023 CLINICAL DATA:  Acute neurologic deficit EXAM: CT HEAD WITHOUT CONTRAST TECHNIQUE: Contiguous axial images were obtained from the base of the skull through the vertex without intravenous contrast. RADIATION DOSE REDUCTION: This exam  was performed according to the departmental dose-optimization program which includes automated exposure control, adjustment of the mA and/or kV according to patient size and/or use of iterative reconstruction technique. COMPARISON:  11/29/2022 FINDINGS: Brain: No mass, hemorrhage or extra-axial collection. 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 Periventricular white matter hypoattenuation. Mild volume loss. Vascular: Carotid and vertebral atherosclerosis at the skull base. Skull: Normal Sinuses/Orbits: Paranasal sinuses are clear. No mastoid effusion. Normal orbits. Other: None. IMPRESSION: 1. No acute intracranial abnormality. 2. 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 Chronic small vessel ischemia. Electronically Signed   By: Deatra Robinson M.D.   On: 03/03/2023 22:31   DG Chest Portable 1 View  Result Date: 03/03/2023 CLINICAL DATA:  Hypoxic respiratory failure. EXAM: PORTABLE CHEST 1 VIEW COMPARISON:  Chest radiographs 12/06/2022. FINDINGS: Low lung volumes emphasize the pulmonary vasculature and baseline cardiomegaly. No consolidation or pulmonary edema. No pleural effusion or pneumothorax. IMPRESSION: Low lung volumes without evidence  of acute cardiopulmonary disease. Cardiomegaly. Electronically Signed   By: Orvan Falconer M.D.   On: 03/03/2023 14:28    Cardiac Studies:  ECG: Sinus rate 76 RBBB    Telemetry:  SR rates 80's   Echo: 07/19/22 severe TR / RV failure LVEF 60-65%   Medications:    amLODipine  10 mg Oral Daily   aspirin EC  81 mg Oral Daily   atorvastatin  20 mg Oral Daily   Chlorhexidine Gluconate Cloth  6 each Topical Q0600   [START ON 03/05/2023] doxercalciferol  1 mcg Intravenous Q T,Th,Sa-HD   heparin  5,000 Units Subcutaneous Q8H   insulin aspart  0-9 Units Subcutaneous TID WC   mouth rinse  15 mL Mouth Rinse 4 times per day      Assessment/Plan:   Bradycardia/CHB:  in setting of hyperkalemia. Resolved with aggressive Rx and  dialysis K now 4.1.  D/C external pacer HtN:  on norvasc would avoid clonidine Can add bidil iff needed Avoid AV nodal drugs  HLD:  continue statin  CRF:  Dialysis again today difficult fistula cannulation Hct 29.7 per nephrology  Charlton Haws 03/04/2023, 10:08 AM

## 2023-03-05 DIAGNOSIS — R001 Bradycardia, unspecified: Secondary | ICD-10-CM | POA: Diagnosis not present

## 2023-03-05 LAB — CBC
HCT: 30.9 % — ABNORMAL LOW (ref 36.0–46.0)
Hemoglobin: 9.8 g/dL — ABNORMAL LOW (ref 12.0–15.0)
MCH: 29.2 pg (ref 26.0–34.0)
MCHC: 31.7 g/dL (ref 30.0–36.0)
MCV: 92 fL (ref 80.0–100.0)
Platelets: 156 10*3/uL (ref 150–400)
RBC: 3.36 MIL/uL — ABNORMAL LOW (ref 3.87–5.11)
RDW: 17.8 % — ABNORMAL HIGH (ref 11.5–15.5)
WBC: 4.8 10*3/uL (ref 4.0–10.5)
nRBC: 0 % (ref 0.0–0.2)

## 2023-03-05 LAB — RENAL FUNCTION PANEL
Albumin: 3.1 g/dL — ABNORMAL LOW (ref 3.5–5.0)
Anion gap: 15 (ref 5–15)
BUN: 21 mg/dL (ref 8–23)
CO2: 25 mmol/L (ref 22–32)
Calcium: 8.6 mg/dL — ABNORMAL LOW (ref 8.9–10.3)
Chloride: 93 mmol/L — ABNORMAL LOW (ref 98–111)
Creatinine, Ser: 4.23 mg/dL — ABNORMAL HIGH (ref 0.44–1.00)
GFR, Estimated: 11 mL/min — ABNORMAL LOW (ref >60)
Glucose, Bld: 88 mg/dL (ref 70–99)
Phosphorus: 5.3 mg/dL — ABNORMAL HIGH (ref 2.5–4.6)
Potassium: 4.2 mmol/L (ref 3.5–5.1)
Sodium: 133 mmol/L — ABNORMAL LOW (ref 135–145)

## 2023-03-05 LAB — GLUCOSE, CAPILLARY
Glucose-Capillary: 96 mg/dL (ref 70–99)
Glucose-Capillary: 96 mg/dL (ref 70–99)
Glucose-Capillary: 183 mg/dL — ABNORMAL HIGH (ref 70–99)
Glucose-Capillary: 152 mg/dL — ABNORMAL HIGH (ref 70–99)

## 2023-03-05 MED ORDER — SENNOSIDES-DOCUSATE SODIUM 8.6-50 MG PO TABS
1.0000 | ORAL_TABLET | Freq: Every evening | ORAL | Status: DC | PRN
  Administered 2023-03-06: 1 via ORAL
  Filled 2023-03-05: qty 1

## 2023-03-05 MED ORDER — TRAZODONE HCL 50 MG PO TABS: 50.0000 mg | ORAL_TABLET | Freq: Every evening | ORAL | Status: DC | PRN

## 2023-03-05 MED ORDER — ACETAMINOPHEN 325 MG PO TABS: 650.0000 mg | ORAL_TABLET | Freq: Four times a day (QID) | ORAL | Status: DC | PRN

## 2023-03-05 MED ORDER — ONDANSETRON HCL 4 MG/2ML IJ SOLN: 4.0000 mg | Freq: Four times a day (QID) | INTRAMUSCULAR | Status: DC | PRN

## 2023-03-05 MED ORDER — ISOSORB DINITRATE-HYDRALAZINE 20-37.5 MG PO TABS
1.0000 | ORAL_TABLET | Freq: Three times a day (TID) | ORAL | Status: DC
  Administered 2023-03-05 (×2): 1 via ORAL
  Filled 2023-03-05 (×2): qty 1

## 2023-03-05 MED ORDER — SEVELAMER CARBONATE 800 MG PO TABS
800.0000 mg | ORAL_TABLET | Freq: Three times a day (TID) | ORAL | Status: DC
  Administered 2023-03-05 – 2023-03-06 (×3): 800 mg via ORAL
  Filled 2023-03-05 (×3): qty 1

## 2023-03-05 MED ORDER — IPRATROPIUM-ALBUTEROL 0.5-2.5 (3) MG/3ML IN SOLN: 3.0000 mL | RESPIRATORY_TRACT | Status: DC | PRN

## 2023-03-05 NOTE — Progress Notes (Signed)
PT Cancellation Note  Patient Details Name: JAMARRIA JUBB MRN: [id number] DOB: [birth year]   Cancelled Treatment:    Reason Eval/Treat Not Completed: (P) Patient at procedure or test/unavailable. Pt currently at HD treatment. Will follow-up later as able.   Virgil Benedict, PT, DPT Acute Rehabilitation Services  Office: [phone]    Bettina Gavia 03/05/2023, 12:36 PM

## 2023-03-05 NOTE — Progress Notes (Signed)
[facility] Progress Note   Subjective:   Pt seen on HD, there was concern for O2 sats dropping but improved with repositioning, seemed to be sensor issue. Patient denies SOB, CP, dizziness, nausea, palpitations. HR in 70's. Discussed her hyperkalemia, unable to figure out specific food that caused it, she states "everything I eat makes it go up."  Objective Vitals:   03/05/23 0820 03/05/23 0826 03/05/23 0830 03/05/23 0900  BP: (!) 172/65 (!) 182/66 (!) 187/64 (!) 179/63  Pulse: 73 75 74 72  Resp: 13 20 20 16   Temp:      TempSrc:      SpO2: 95% 93% 97% 95%  Weight:       Physical Exam General: Alert female in NAD Heart: RRR, no murmurs, rubs or gallops Lungs: CTA anteriorly on RA, respirations unlabored Abdomen: Soft, non-distended, +BS Extremities: No edema b/l lower extremities Dialysis Access:  AVF accessed  Additional Objective Labs: Basic Metabolic Panel: Recent Labs  Lab 03/03/23 1843 03/04/23 0753 03/05/23 0427  NA 132* 129* 133*  K 3.0* 4.1 4.2  CL 89* 92* 93*  CO2 27 25 25   GLUCOSE 70 144* 88  BUN 5* 9 21  CREATININE 1.14* 2.67* 4.23*  CALCIUM 8.1* 8.3* 8.6*  PHOS  --   --  5.3*   Liver Function Tests: Recent Labs  Lab 03/03/23 1107 03/04/23 0753 03/05/23 0427  AST 20 22  --   ALT 16 17  --   ALKPHOS 108 96  --   BILITOT 0.8 0.5  --   PROT 6.7 6.0*  --   ALBUMIN 3.4* 3.1* 3.1*   Recent Labs  Lab 03/03/23 1107  LIPASE 31   CBC: Recent Labs  Lab 03/03/23 1107 03/03/23 1113 03/04/23 0753 03/05/23 0427  WBC 8.8  --  6.2 4.8  NEUTROABS 6.8  --   --   --   HGB 12.4 13.9 9.4* 9.8*  HCT 39.6 41.0 29.7* 30.9*  MCV 94.3  --  90.8 92.0  PLT 235  --  160 156   Blood Culture    Component Value Date/Time   SDES URINE, CLEAN CATCH 02/17/2021 1403   SPECREQUEST  02/17/2021 1403    NONE Performed at [facility], [address], [place], [place] [address]    CULT 40,000 COLONIES/mL ENTEROCOCCUS FAECALIS (A) 02/17/2021  1403   REPTSTATUS 02/19/2021 FINAL 02/17/2021 1403    Cardiac Enzymes: No results for input(s): "CKTOTAL", "CKMB", "CKMBINDEX", "TROPONINI" in the last 168 hours. CBG: Recent Labs  Lab 03/04/23 0729 03/04/23 1114 03/04/23 1651 03/04/23 2019 03/05/23 0723  GLUCAP 133* 96 107* 76 96   Iron Studies: No results for input(s): "IRON", "TIBC", "TRANSFERRIN", "FERRITIN" in the last 72 hours. @lablastinr3 @ Studies/Results: CT HEAD WO CONTRAST ( )  Result Date: 03/03/2023 CLINICAL DATA:  Acute neurologic deficit EXAM: CT HEAD WITHOUT CONTRAST TECHNIQUE: Contiguous axial images were obtained from the base of the skull through the vertex without intravenous contrast. RADIATION DOSE REDUCTION: This exam was performed according to the departmental dose-optimization program which includes automated exposure control, adjustment of the mA and/or kV according to patient size and/or use of iterative reconstruction technique. COMPARISON:  11/29/2022 FINDINGS: Brain: No mass, hemorrhage or extra-axial collection. 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 Periventricular white matter hypoattenuation. Mild volume loss. Vascular: Carotid and vertebral atherosclerosis at the skull base. Skull: Normal Sinuses/Orbits: Paranasal sinuses are clear. No mastoid effusion. Normal orbits. Other: None. IMPRESSION: 1. No acute intracranial  abnormality. 2. 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 Chronic small vessel ischemia. Electronically Signed   By: Deatra Robinson M.D.   On: 03/03/2023 22:31   DG Chest Portable 1 View  Result Date: 03/03/2023 CLINICAL DATA:  Hypoxic respiratory failure. EXAM: PORTABLE CHEST 1 VIEW COMPARISON:  Chest radiographs 12/06/2022. FINDINGS: Low lung volumes emphasize the pulmonary vasculature and baseline cardiomegaly. No consolidation or pulmonary edema. No pleural effusion or pneumothorax. IMPRESSION: Low lung volumes without evidence of acute  cardiopulmonary disease. Cardiomegaly. Electronically Signed   By: Orvan Falconer M.D.   On: 03/03/2023 14:28   Medications:   amLODipine  10 mg Oral Daily   aspirin EC  81 mg Oral Daily   atorvastatin  20 mg Oral Daily   Chlorhexidine Gluconate Cloth  6 each Topical Q0600   doxercalciferol  1 mcg Intravenous Q T,Th,Sa-HD   heparin  5,000 Units Subcutaneous Q8H   insulin aspart  0-9 Units Subcutaneous TID WC   mouth rinse  15 mL Mouth Rinse 4 times per day    Dialysis Orders: Center: [facility]  on TTS . 160NRe 3 hr 45 min BFR 350 DFR 500 EDW 33kg 2K 2Ca AVF 16g No heparin No ESA Hectorol IV q HD Phos binder:  renvela 800mg  1 tab per meal  Assessment/Plan: ESRD: On TTS schedule, underwent emergent HD Tuesday. Tolerating HD well today.  Hyperkalemia: resolved. Reviewed low K diet. If K+ remains elevated may need to consider lokelma outpatient  Persistent bradycardia req external packing: cardiology following, now appears to be resolved HTN/Vol: BP elevated, no UF with first HD, tolerating UF so far today.  Anemia: Hb dropped to 9.4, not on ESA as outpt.  Probably some ABLA from difficult cannulation 9/10, trend fornow CKD-BMD: Calcium and phos controlled. Continue VDRA. Restarted renvela.   Rogers Blocker, PA-C 03/05/2023, 9:54 AM   [facility] Pager: [phone]

## 2023-03-05 NOTE — Plan of Care (Signed)

## 2023-03-05 NOTE — Progress Notes (Signed)
Pt receives out-pt HD at [facility] on TTS. Will assist as needed.   Tracy Mounce Renal Navigator [phone] 

## 2023-03-05 NOTE — Plan of Care (Signed)
Problem: Education: Goal: Knowledge of General Education information will improve Description: Including pain rating scale, medication(s)/side effects and non-pharmacologic comfort measures 03/05/2023 2023 by Irwin Brakeman, RN Outcome: Progressing 03/05/2023 1703 by Irwin Brakeman, RN Outcome: Progressing   Problem: Health Behavior/Discharge Planning: Goal: Ability to manage health-related needs will improve 03/05/2023 2023 by Irwin Brakeman, RN Outcome: Progressing 03/05/2023 1703 by Irwin Brakeman, RN Outcome: Progressing   Problem: Clinical Measurements: Goal: Ability to maintain clinical measurements within normal limits will improve 03/05/2023 2023 by Irwin Brakeman, RN Outcome: Progressing 03/05/2023 1703 by Irwin Brakeman, RN Outcome: Progressing Goal: Will remain free from infection 03/05/2023 2023 by Irwin Brakeman, RN Outcome: Progressing 03/05/2023 1703 by Irwin Brakeman, RN Outcome: Progressing Goal: Diagnostic test results will improve 03/05/2023 2023 by Irwin Brakeman, RN Outcome: Progressing 03/05/2023 1703 by Irwin Brakeman, RN Outcome: Progressing Goal: Respiratory complications will improve 03/05/2023 2023 by Irwin Brakeman, RN Outcome: Progressing 03/05/2023 1703 by Irwin Brakeman, RN Outcome: Progressing Goal: Cardiovascular complication will be avoided 03/05/2023 2023 by Irwin Brakeman, RN Outcome: Progressing 03/05/2023 1703 by Irwin Brakeman, RN Outcome: Progressing   Problem: Activity: Goal: Risk for activity intolerance will decrease 03/05/2023 2023 by Irwin Brakeman, RN Outcome: Progressing 03/05/2023 1703 by Irwin Brakeman, RN Outcome: Progressing   Problem: Nutrition: Goal: Adequate nutrition will be maintained 03/05/2023 2023 by Irwin Brakeman, RN Outcome: Progressing 03/05/2023 1703 by Irwin Brakeman,  RN Outcome: Progressing   Problem: Coping: Goal: Level of anxiety will decrease 03/05/2023 2023 by Irwin Brakeman, RN Outcome: Progressing 03/05/2023 1703 by Irwin Brakeman, RN Outcome: Progressing   Problem: Elimination: Goal: Will not experience complications related to bowel motility 03/05/2023 2023 by Irwin Brakeman, RN Outcome: Progressing 03/05/2023 1703 by Irwin Brakeman, RN Outcome: Progressing Goal: Will not experience complications related to urinary retention 03/05/2023 2023 by Irwin Brakeman, RN Outcome: Progressing 03/05/2023 1703 by Irwin Brakeman, RN Outcome: Progressing   Problem: Pain Managment: Goal: General experience of comfort will improve 03/05/2023 2023 by Irwin Brakeman, RN Outcome: Progressing 03/05/2023 1703 by Irwin Brakeman, RN Outcome: Progressing   Problem: Safety: Goal: Ability to remain free from injury will improve 03/05/2023 2023 by Irwin Brakeman, RN Outcome: Progressing 03/05/2023 1703 by Irwin Brakeman, RN Outcome: Progressing   Problem: Skin Integrity: Goal: Risk for impaired skin integrity will decrease 03/05/2023 2023 by Irwin Brakeman, RN Outcome: Progressing 03/05/2023 1703 by Irwin Brakeman, RN Outcome: Progressing   Problem: Education: Goal: Ability to describe self-care measures that may prevent or decrease complications (Diabetes Survival Skills Education) will improve 03/05/2023 2023 by Irwin Brakeman, RN Outcome: Progressing 03/05/2023 1703 by Irwin Brakeman, RN Outcome: Progressing Goal: Individualized Educational Video(s) 03/05/2023 2023 by Irwin Brakeman, RN Outcome: Progressing 03/05/2023 1703 by Irwin Brakeman, RN Outcome: Progressing   Problem: Coping: Goal: Ability to adjust to condition or change in health will improve 03/05/2023 2023 by Irwin Brakeman, RN Outcome: Progressing 03/05/2023  1703 by Irwin Brakeman, RN Outcome: Progressing   Problem: Fluid Volume: Goal: Ability to maintain a balanced intake and output will improve 03/05/2023 2023 by Irwin Brakeman, RN Outcome: Progressing 03/05/2023 1703 by Irwin Brakeman, RN Outcome: Progressing   Problem: Health Behavior/Discharge Planning: Goal: Ability to identify and utilize available resources and services will improve 03/05/2023 2023 by Irwin Brakeman, RN Outcome: Progressing 03/05/2023 1703 by Irwin Brakeman,  RN Outcome: Progressing Goal: Ability to manage health-related needs will improve 03/05/2023 2023 by Irwin Brakeman, RN Outcome: Progressing 03/05/2023 1703 by Irwin Brakeman, RN Outcome: Progressing   Problem: Metabolic: Goal: Ability to maintain appropriate glucose levels will improve 03/05/2023 2023 by Irwin Brakeman, RN Outcome: Progressing 03/05/2023 1703 by Irwin Brakeman, RN Outcome: Progressing   Problem: Nutritional: Goal: Maintenance of adequate nutrition will improve 03/05/2023 2023 by Irwin Brakeman, RN Outcome: Progressing 03/05/2023 1703 by Irwin Brakeman, RN Outcome: Progressing Goal: Progress toward achieving an optimal weight will improve 03/05/2023 2023 by Irwin Brakeman, RN Outcome: Progressing 03/05/2023 1703 by Irwin Brakeman, RN Outcome: Progressing   Problem: Skin Integrity: Goal: Risk for impaired skin integrity will decrease 03/05/2023 2023 by Irwin Brakeman, RN Outcome: Progressing 03/05/2023 1703 by Irwin Brakeman, RN Outcome: Progressing   Problem: Tissue Perfusion: Goal: Adequacy of tissue perfusion will improve 03/05/2023 2023 by Irwin Brakeman, RN Outcome: Progressing 03/05/2023 1703 by Irwin Brakeman, RN Outcome: Progressing

## 2023-03-05 NOTE — Plan of Care (Signed)
Problem: Education: Goal: Knowledge of General Education information will improve Description: Including pain rating scale, medication(s)/side effects and non-pharmacologic comfort measures 03/05/2023 2344 by Irwin Brakeman, RN Outcome: Progressing 03/05/2023 2023 by Irwin Brakeman, RN Outcome: Progressing 03/05/2023 1703 by Irwin Brakeman, RN Outcome: Progressing   Problem: Health Behavior/Discharge Planning: Goal: Ability to manage health-related needs will improve 03/05/2023 2344 by Irwin Brakeman, RN Outcome: Progressing 03/05/2023 2023 by Irwin Brakeman, RN Outcome: Progressing 03/05/2023 1703 by Irwin Brakeman, RN Outcome: Progressing   Problem: Clinical Measurements: Goal: Ability to maintain clinical measurements within normal limits will improve 03/05/2023 2344 by Irwin Brakeman, RN Outcome: Progressing 03/05/2023 2023 by Irwin Brakeman, RN Outcome: Progressing 03/05/2023 1703 by Irwin Brakeman, RN Outcome: Progressing Goal: Will remain free from infection 03/05/2023 2344 by Irwin Brakeman, RN Outcome: Progressing 03/05/2023 2023 by Irwin Brakeman, RN Outcome: Progressing 03/05/2023 1703 by Irwin Brakeman, RN Outcome: Progressing Goal: Diagnostic test results will improve 03/05/2023 2344 by Irwin Brakeman, RN Outcome: Progressing 03/05/2023 2023 by Irwin Brakeman, RN Outcome: Progressing 03/05/2023 1703 by Irwin Brakeman, RN Outcome: Progressing Goal: Respiratory complications will improve 03/05/2023 2344 by Irwin Brakeman, RN Outcome: Progressing 03/05/2023 2023 by Irwin Brakeman, RN Outcome: Progressing 03/05/2023 1703 by Irwin Brakeman, RN Outcome: Progressing Goal: Cardiovascular complication will be avoided 03/05/2023 2344 by Irwin Brakeman, RN Outcome: Progressing 03/05/2023 2023 by Irwin Brakeman, RN Outcome:  Progressing 03/05/2023 1703 by Irwin Brakeman, RN Outcome: Progressing   Problem: Activity: Goal: Risk for activity intolerance will decrease 03/05/2023 2344 by Irwin Brakeman, RN Outcome: Progressing 03/05/2023 2023 by Irwin Brakeman, RN Outcome: Progressing 03/05/2023 1703 by Irwin Brakeman, RN Outcome: Progressing   Problem: Nutrition: Goal: Adequate nutrition will be maintained 03/05/2023 2344 by Irwin Brakeman, RN Outcome: Progressing 03/05/2023 2023 by Irwin Brakeman, RN Outcome: Progressing 03/05/2023 1703 by Irwin Brakeman, RN Outcome: Progressing   Problem: Coping: Goal: Level of anxiety will decrease 03/05/2023 2344 by Irwin Brakeman, RN Outcome: Progressing 03/05/2023 2023 by Irwin Brakeman, RN Outcome: Progressing 03/05/2023 1703 by Irwin Brakeman, RN Outcome: Progressing   Problem: Elimination: Goal: Will not experience complications related to bowel motility 03/05/2023 2344 by Irwin Brakeman, RN Outcome: Progressing 03/05/2023 2023 by Irwin Brakeman, RN Outcome: Progressing 03/05/2023 1703 by Irwin Brakeman, RN Outcome: Progressing Goal: Will not experience complications related to urinary retention 03/05/2023 2344 by Irwin Brakeman, RN Outcome: Progressing 03/05/2023 2023 by Irwin Brakeman, RN Outcome: Progressing 03/05/2023 1703 by Irwin Brakeman, RN Outcome: Progressing   Problem: Pain Managment: Goal: General experience of comfort will improve 03/05/2023 2344 by Irwin Brakeman, RN Outcome: Progressing 03/05/2023 2023 by Irwin Brakeman, RN Outcome: Progressing 03/05/2023 1703 by Irwin Brakeman, RN Outcome: Progressing   Problem: Safety: Goal: Ability to remain free from injury will improve 03/05/2023 2344 by Irwin Brakeman, RN Outcome: Progressing 03/05/2023 2023 by Irwin Brakeman, RN Outcome:  Progressing 03/05/2023 1703 by Irwin Brakeman, RN Outcome: Progressing   Problem: Skin Integrity: Goal: Risk for impaired skin integrity will decrease 03/05/2023 2344 by Irwin Brakeman, RN Outcome: Progressing 03/05/2023 2023 by Irwin Brakeman, RN Outcome: Progressing 03/05/2023 1703 by Irwin Brakeman, RN Outcome: Progressing   Problem: Education: Goal: Ability to describe self-care measures that may prevent or decrease complications (Diabetes Survival Skills Education) will improve 03/05/2023 2344 by Irwin Brakeman,  RN Outcome: Progressing 03/05/2023 2023 by Irwin Brakeman, RN Outcome: Progressing 03/05/2023 1703 by Irwin Brakeman, RN Outcome: Progressing Goal: Individualized Educational Video(s) 03/05/2023 2344 by Irwin Brakeman, RN Outcome: Progressing 03/05/2023 2023 by Irwin Brakeman, RN Outcome: Progressing 03/05/2023 1703 by Irwin Brakeman, RN Outcome: Progressing   Problem: Coping: Goal: Ability to adjust to condition or change in health will improve 03/05/2023 2344 by Irwin Brakeman, RN Outcome: Progressing 03/05/2023 2023 by Irwin Brakeman, RN Outcome: Progressing 03/05/2023 1703 by Irwin Brakeman, RN Outcome: Progressing   Problem: Fluid Volume: Goal: Ability to maintain a balanced intake and output will improve 03/05/2023 2344 by Irwin Brakeman, RN Outcome: Progressing 03/05/2023 2023 by Irwin Brakeman, RN Outcome: Progressing 03/05/2023 1703 by Irwin Brakeman, RN Outcome: Progressing   Problem: Health Behavior/Discharge Planning: Goal: Ability to identify and utilize available resources and services will improve 03/05/2023 2344 by Irwin Brakeman, RN Outcome: Progressing 03/05/2023 2023 by Irwin Brakeman, RN Outcome: Progressing 03/05/2023 1703 by Irwin Brakeman, RN Outcome: Progressing Goal: Ability to manage  health-related needs will improve 03/05/2023 2344 by Irwin Brakeman, RN Outcome: Progressing 03/05/2023 2023 by Irwin Brakeman, RN Outcome: Progressing 03/05/2023 1703 by Irwin Brakeman, RN Outcome: Progressing   Problem: Metabolic: Goal: Ability to maintain appropriate glucose levels will improve 03/05/2023 2344 by Irwin Brakeman, RN Outcome: Progressing 03/05/2023 2023 by Irwin Brakeman, RN Outcome: Progressing 03/05/2023 1703 by Irwin Brakeman, RN Outcome: Progressing   Problem: Nutritional: Goal: Maintenance of adequate nutrition will improve 03/05/2023 2344 by Irwin Brakeman, RN Outcome: Progressing 03/05/2023 2023 by Irwin Brakeman, RN Outcome: Progressing 03/05/2023 1703 by Irwin Brakeman, RN Outcome: Progressing Goal: Progress toward achieving an optimal weight will improve 03/05/2023 2344 by Irwin Brakeman, RN Outcome: Progressing 03/05/2023 2023 by Irwin Brakeman, RN Outcome: Progressing 03/05/2023 1703 by Irwin Brakeman, RN Outcome: Progressing   Problem: Skin Integrity: Goal: Risk for impaired skin integrity will decrease 03/05/2023 2344 by Irwin Brakeman, RN Outcome: Progressing 03/05/2023 2023 by Irwin Brakeman, RN Outcome: Progressing 03/05/2023 1703 by Irwin Brakeman, RN Outcome: Progressing   Problem: Tissue Perfusion: Goal: Adequacy of tissue perfusion will improve 03/05/2023 2344 by Irwin Brakeman, RN Outcome: Progressing 03/05/2023 2023 by Irwin Brakeman, RN Outcome: Progressing 03/05/2023 1703 by Irwin Brakeman, RN Outcome: Progressing

## 2023-03-05 NOTE — Progress Notes (Signed)
   03/05/23 1200  Vitals  Temp 98 F (36.7 C)  Pulse Rate 72  Resp (!) 21  BP (!) 188/65  SpO2 97 %  Post Treatment  Dialyzer Clearance Clear  Hemodialysis Intake (mL) 0 mL  Liters Processed 63  Fluid Removed (mL) 2000 mL  Tolerated HD Treatment Yes  AVG/AVF Arterial Site Held (minutes) 8 minutes  AVG/AVF Venous Site Held (minutes) 8 minutes   Received patient in bed to unit.  Alert and oriented.  Informed consent signed and in chart.   TX duration:  Patient tolerated well.  Transported back to the room  Alert, without acute distress.  Hand-off given to patient's nurse.   Access used: lua avf Access issues: none  Total UF removed: 2L Medication(s) given: HECTEROL    [name]

## 2023-03-05 NOTE — TOC Initial Note (Signed)
Transition of Care [facility]) - Initial/Assessment Note    Patient Details  Name: [name] Michael MRN: [id number] Date of Birth: [birth year]  [facility]) CM/SW Contact:    Erin Sons, LCSW Phone Number: 03/05/2023, 2:28 PM  Clinical Narrative:                  Pt from [facility]. CSW contacted [facility] and confirmed pt is LTC there and can return when ready. Fl2 complete and sent in hub.   Expected Discharge Plan: Skilled Nursing Facility Barriers to Discharge: Continued Medical Work up   Patient Goals and CMS Choice            Expected Discharge Plan and Services                                              Prior Living Arrangements/Services                       Activities of Daily Living      Permission Sought/Granted                  Emotional Assessment              Admission diagnosis:  Symptomatic bradycardia [R00.1] Patient Active Problem List   Diagnosis Date Noted   Hyperkalemia 03/04/2023   Symptomatic bradycardia 03/03/2023   CHF (congestive heart failure) (HCC) 12/04/2022   RSV (respiratory syncytial virus pneumonia) 07/18/2022   Hypothyroidism 07/18/2022   Elevated troponin 07/18/2022   Tobacco abuse 07/18/2022   Acute on chronic respiratory failure with hypoxia (HCC) 02/18/2021   Pressure injury of skin 02/17/2021   Abdominal ascites 01/25/2021   Lymphocytopenia 10/13/2020   Ear hematoma, right, initial encounter 10/13/2020   Prolonged QT interval 10/13/2020   Lactic acidosis 10/05/2020   Transaminitis 10/05/2020   Thrombocytopenia (HCC) 10/05/2020   Acute respiratory failure with hypoxia in setting of RSV 10/05/2020   Altered mental status 08/22/2020   Acute metabolic encephalopathy 08/21/2020   Severe tricuspid regurgitation 12/04/2019   ESRD (end stage renal disease) (HCC)    GERD (gastroesophageal reflux disease)    Hypertension    Type II diabetes mellitus (HCC)    False  positive HIV serology 05/23/2019   AMS (altered mental status) 04/28/2019   Hypoxemia 04/28/2019   Anemia of chronic disease 06/09/2018   Syncope and collapse 06/09/2018   Encephalopathy acute 03/10/2018   Hypermagnesemia 03/10/2018   Acute lower UTI 11/09/2016   Uncontrolled type 2 diabetes mellitus with complication    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 associated with diabetes mellitus due to underlying condition (HCC)    Acute on chronic diastolic CHF (congestive heart failure) (HCC)    Hypokalemia 06/11/2016   Proliferative diabetic retinopathy (HCC) 04/29/2016   Poor social situation 03/09/2013   Depression 03/01/2013   Abnormal mammogram 12/20/2012   Retinal detachment 11/17/2012   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 (HCC) 02/09/2007   Hyperlipidemia 02/09/2007   Essential hypertension 02/09/2007   PCP:  Renford Dills, MD Pharmacy:  No Pharmacies Listed    Social Determinants of Health (SDOH) Social History: SDOH Screenings   Food Insecurity: No Food Insecurity (12/04/2022)  Housing: Low Risk  (12/04/2022)  Transportation Needs: No Transportation Needs (12/04/2022)  Utilities: Not At Risk (12/04/2022)  Depression (PHQ2-9): Low Risk  (05/23/2019)  Tobacco Use: High Risk (12/04/2022)   SDOH Interventions:     Readmission Risk Interventions    02/21/2021   10:09 AM 01/31/2021    3:06 PM  Readmission Risk Prevention Plan  Transportation Screening Complete Complete  Medication Review (RN Care Manager) Referral to Pharmacy Referral to Pharmacy  PCP or Specialist appointment within 3-5 days of discharge Complete Complete  HRI or Home Care Consult Complete Complete  SW Recovery Care/Counseling Consult Complete Complete  Palliative Care Screening Not Applicable Not Applicable  Skilled Nursing Facility Complete Complete

## 2023-03-05 NOTE — Progress Notes (Signed)
Cardiologist :  Tenny Craw  Subjective:  Denies SSCP, palpitations or Dyspnea MS improved Seen at dialysis   Objective:  Vitals:   03/05/23 0820 03/05/23 0826 03/05/23 0830 03/05/23 0900  BP: (!) 172/65 (!) 182/66 (!) 187/64 (!) 179/63  Pulse: 73 75 74 72  Resp: 13 20 20 16   Temp:      TempSrc:      SpO2: 95% 93% 97% 95%  Weight:        Intake/Output from previous day:  Intake/Output Summary (Last 24 hours) at 03/05/2023 0934 Last data filed at 03/05/2023 0650 Gross per 24 hour  Intake 460 ml  Output 1150 ml  Net -690 ml    Physical Exam: Chronically ill female Lungs clear SEM JVP elevated with V wave  Fistual in [name] Abdomen benign No edema  Lab Results: Basic Metabolic Panel: Recent Labs    03/03/23 1107 03/03/23 1113 03/04/23 0753 03/05/23 0427  NA 126*   < > 129* 133*  K 6.7*   < > 4.1 4.2  CL 91*   < > 92* 93*  CO2 19*   < > 25 25  GLUCOSE 216*   < > 144* 88  BUN 44*   < > 9 21  CREATININE 5.93*   < > 2.67* 4.23*  CALCIUM 8.6*   < > 8.3* 8.6*  MG 2.2  --  1.4*  --   PHOS  --   --   --  5.3*   < > = values in this interval not displayed.   Liver Function Tests: Recent Labs    03/03/23 1107 03/04/23 0753 03/05/23 0427  AST 20 22  --   ALT 16 17  --   ALKPHOS 108 96  --   BILITOT 0.8 0.5  --   PROT 6.7 6.0*  --   ALBUMIN 3.4* 3.1* 3.1*   Recent Labs    03/03/23 1107  LIPASE 31   CBC: Recent Labs    03/03/23 1107 03/03/23 1113 03/04/23 0753 03/05/23 0427  WBC 8.8  --  6.2 4.8  NEUTROABS 6.8  --   --   --   HGB 12.4   < > 9.4* 9.8*  HCT 39.6   < > 29.7* 30.9*  MCV 94.3  --  90.8 92.0  PLT 235  --  160 156   < > = values in this interval not displayed.    Thyroid Function Tests: Recent Labs    03/03/23 1405  TSH 2.464   Imaging: CT HEAD WO CONTRAST ( )  Result Date: 03/03/2023 CLINICAL DATA:  Acute neurologic deficit EXAM: CT HEAD WITHOUT CONTRAST TECHNIQUE: Contiguous axial images were obtained from the base of the skull  through the vertex without intravenous contrast. RADIATION DOSE REDUCTION: This exam was performed according to the departmental dose-optimization program which includes automated exposure control, adjustment of the mA and/or kV according to patient size and/or use of iterative reconstruction technique. COMPARISON:  11/29/2022 FINDINGS: Brain: No mass, hemorrhage or extra-axial collection. 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 Periventricular white matter hypoattenuation. Mild volume loss. Vascular: Carotid and vertebral atherosclerosis at the skull base. Skull: Normal Sinuses/Orbits: Paranasal sinuses are clear. No mastoid effusion. Normal orbits. Other: None. IMPRESSION: 1. No acute intracranial abnormality. 2. 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 Chronic small vessel ischemia. Electronically Signed   By: Deatra Robinson M.D.   On: 03/03/2023 22:31   DG Chest Portable 1 View  Result Date: 03/03/2023 CLINICAL DATA:  Hypoxic respiratory failure. EXAM: PORTABLE CHEST 1 VIEW COMPARISON:  Chest radiographs 12/06/2022. FINDINGS: Low lung volumes emphasize the pulmonary vasculature and baseline cardiomegaly. No consolidation or pulmonary edema. No pleural effusion or pneumothorax. IMPRESSION: Low lung volumes without evidence of acute cardiopulmonary disease. Cardiomegaly. Electronically Signed   By: Orvan Falconer M.D.   On: 03/03/2023 14:28    Cardiac Studies:  ECG: Sinus rate 76 RBBB    Telemetry:  SR rates 70-80's   Echo: 07/19/22 severe TR / RV failure LVEF 60-65%   Medications:    amLODipine  10 mg Oral Daily   aspirin EC  81 mg Oral Daily   atorvastatin  20 mg Oral Daily   Chlorhexidine Gluconate Cloth  6 each Topical Q0600   doxercalciferol  1 mcg Intravenous Q T,Th,Sa-HD   heparin  5,000 Units Subcutaneous Q8H   insulin aspart  0-9 Units Subcutaneous TID WC   mouth rinse  15 mL Mouth Rinse 4 times per day      Assessment/Plan:    Bradycardia/CHB:  in setting of hyperkalemia. Resolved with aggressive Rx and dialysis K now 4.2.  External pacer d/c Sinus with chronic RBBB No indication for PPM HtN:  on norvasc would avoid clonidine Can add bidil iff needed Avoid AV nodal drugs  HLD:  continue statin  CRF:  Dialysis again today difficult fistula cannulation Hct 29.7 per nephrology  Per nephrology and primary service BP still elevated would consider adding bidil   Charlton Haws 03/05/2023, 9:34 AM

## 2023-03-05 NOTE — Progress Notes (Signed)
PROGRESS NOTE    [name] Michael  UJW:[id number] DOB: 1960-11-29 DOA: 03/03/2023 PCP: Renford Dills, MD   Brief Narrative:  62 year old with history of ESRD on HD, CHF with preserved EF cor pulmonale, DM 2 admitted for severe symptomatic bradycardia diagnosed with hyperkalemia in the setting of taking AV nodal blocker at home including Coreg and clonidine.  Patient immediately emergent dialysis which improved her heart rate and blood pressure.  She was taken off transcutaneous pacing.  Seen by cardiology team.   Assessment & Plan:  Principal Problem:   Symptomatic bradycardia Active Problems:   ESRD (end stage renal disease) (HCC)   Encephalopathy acute   Hyperkalemia    Symptomatic bradycardia in the setting of hyperkalemia Hyper-K has now resolved with emergent dialysis.  Tolerating another session this morning.   ESRD on HD TTSa Hypervolemic hyponatremia Emergent HD 9/10, another session this morning tolerating well   Acute C secondary to uremia, resolved CT head negative   Hypothyroidism TSH 2.464, T4 1.15.  Continue Synthroid, follow-up outpatient   T2DM Sliding scale and Accu-Chek.   Anemia of chronic kidney disease Hemoglobin overall stable around 9.5   History of CVA with hyperlipidemia Continue aspirin and statin   Hx HTN, uncontrolled Currently on Norvasc.  BiDil added.  IV as needed clonidine and Coreg on hold   HFpEF w/ cor pulomonale Seen by cardiology team.  Supportive care.  Volume status managed with dialysis  DVT prophylaxis: heparin injection 5,000 Units Start: 03/03/23 1445   Code Status: Full code Family Communication:   Status is: Inpatient Remains inpatient appropriate because: Continue hospital stay.  Continue to monitor labs.  Will get PT/OT.  Hopefully discharge in next 24 hours    Subjective:  Seen dialysis, overall feels weak, no other complaints.  Examination:  General exam: Appears calm and comfortable  Respiratory system:  Clear to auscultation. Respiratory effort normal. Cardiovascular system: S1 & S2 heard, RRR. No JVD, murmurs, rubs, gallops or clicks. No pedal edema. Gastrointestinal system: Abdomen is nondistended, soft and nontender. No organomegaly or masses felt. Normal bowel sounds heard. Central nervous system: Alert and oriented. No focal neurological deficits. Extremities: Symmetric 5 x 5 power. Skin: No rashes, lesions or ulcers Psychiatry: Judgement and insight appear normal. Mood & affect appropriate.      Diet Orders (From admission, onward)     Start     Ordered   03/04/23 0907  Diet renal with fluid restriction Fluid restriction: 1200 mL Fluid; Room service appropriate? Yes; Fluid consistency: Thin  Diet effective now       Question Answer Comment  Fluid restriction: 1200 mL Fluid   Room service appropriate? Yes   Fluid consistency: Thin      03/04/23 0906            Objective: Vitals:   03/05/23 1130 03/05/23 1157 03/05/23 1200 03/05/23 1250  BP: (!) 181/57 (!) 175/62 (!) 188/65 (!) 178/57  Pulse: 68 71 72 78  Resp: 13 18 (!) 21 18  Temp:   98 F (36.7 C) 98.4 F (36.9 C)  TempSrc:    Oral  SpO2: 97% 98% 97% 97%  Weight:   38 kg     Intake/Output Summary (Last 24 hours) at 03/05/2023 1305 Last data filed at 03/05/2023 1200 Gross per 24 hour  Intake 220 ml  Output 2900 ml  Net -2680 ml   Filed Weights   03/05/23 0810 03/05/23 1200  Weight: 39.8 kg 38 kg    Scheduled Meds:  amLODipine  10 mg Oral Daily   aspirin EC  81 mg Oral Daily   atorvastatin  20 mg Oral Daily   Chlorhexidine Gluconate Cloth  6 each Topical Q0600   doxercalciferol  1 mcg Intravenous Q T,Th,Sa-HD   heparin  5,000 Units Subcutaneous Q8H   insulin aspart  0-9 Units Subcutaneous TID WC   mouth rinse  15 mL Mouth Rinse 4 times per day   sevelamer carbonate  800 mg Oral TID WC   Continuous Infusions:  Nutritional status     Body mass index is 19.47 kg/m.  Data Reviewed:    CBC: Recent Labs  Lab 03/03/23 1107 03/03/23 1113 03/04/23 0753 03/05/23 0427  WBC 8.8  --  6.2 4.8  NEUTROABS 6.8  --   --   --   HGB 12.4 13.9 9.4* 9.8*  HCT 39.6 41.0 29.7* 30.9*  MCV 94.3  --  90.8 92.0  PLT 235  --  160 156   Basic Metabolic Panel: Recent Labs  Lab 03/03/23 1107 03/03/23 1113 03/03/23 1843 03/04/23 0753 03/05/23 0427  NA 126* 125* 132* 129* 133*  K 6.7* 6.8* 3.0* 4.1 4.2  CL 91* 96* 89* 92* 93*  CO2 19*  --  27 25 25   GLUCOSE 216* 215* 70 144* 88  BUN 44* 45* 5* 9 21  CREATININE 5.93* 6.70* 1.14* 2.67* 4.23*  CALCIUM 8.6*  --  8.1* 8.3* 8.6*  MG 2.2  --   --  1.4*  --   PHOS  --   --   --   --  5.3*   GFR: Estimated Creatinine Clearance: 7.4 mL/min (A) (by C-G formula based on SCr of 4.23 mg/dL (H)). Liver Function Tests: Recent Labs  Lab 03/03/23 1107 03/04/23 0753 03/05/23 0427  AST 20 22  --   ALT 16 17  --   ALKPHOS 108 96  --   BILITOT 0.8 0.5  --   PROT 6.7 6.0*  --   ALBUMIN 3.4* 3.1* 3.1*   Recent Labs  Lab 03/03/23 1107  LIPASE 31   No results for input(s): "AMMONIA" in the last 168 hours. Coagulation Profile: No results for input(s): "INR", "PROTIME" in the last 168 hours. Cardiac Enzymes: No results for input(s): "CKTOTAL", "CKMB", "CKMBINDEX", "TROPONINI" in the last 168 hours. BNP (last 3 results) No results for input(s): "PROBNP" in the last 8760 hours. HbA1C: No results for input(s): "HGBA1C" in the last 72 hours. CBG: Recent Labs  Lab 03/04/23 1114 03/04/23 1651 03/04/23 2019 03/05/23 0723 03/05/23 1258  GLUCAP 96 107* 76 96 96   Lipid Profile: No results for input(s): "CHOL", "HDL", "LDLCALC", "TRIG", "CHOLHDL", "LDLDIRECT" in the last 72 hours. Thyroid Function Tests: Recent Labs    03/03/23 1405  TSH 2.464  FREET4 1.15*   Anemia Panel: No results for input(s): "VITAMINB12", "FOLATE", "FERRITIN", "TIBC", "IRON", "RETICCTPCT" in the last 72 hours. Sepsis Labs: Recent Labs  Lab  03/03/23 1405 03/03/23 1542  LATICACIDVEN 1.5 0.7    Recent Results (from the past 240 hour(s))  MRSA Next Gen by PCR, Nasal     Status: None   Collection Time: 03/03/23  1:17 PM   Specimen: Nasal Mucosa; Nasal Swab  Result Value Ref Range Status   MRSA by PCR Next Gen NOT DETECTED NOT DETECTED Final    Comment: (NOTE) The GeneXpert MRSA Assay (FDA approved for NASAL specimens only), is one component of a comprehensive MRSA colonization surveillance program. It is not intended to diagnose  MRSA infection nor to guide or monitor treatment for MRSA infections. Test performance is not FDA approved in patients less than 39 years old. Performed at [facility], [address] [place], [place] [address]          Radiology Studies: CT HEAD WO CONTRAST ( )  Result Date: 03/03/2023 CLINICAL DATA:  Acute neurologic deficit EXAM: CT HEAD WITHOUT CONTRAST TECHNIQUE: Contiguous axial images were obtained from the base of the skull through the vertex without intravenous contrast. RADIATION DOSE REDUCTION: This exam was performed according to the departmental dose-optimization program which includes automated exposure control, adjustment of the mA and/or kV according to patient size and/or use of iterative reconstruction technique. COMPARISON:  11/29/2022 FINDINGS: Brain: No mass, hemorrhage or extra-axial collection. 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 Periventricular white matter hypoattenuation. Mild volume loss. Vascular: Carotid and vertebral atherosclerosis at the skull base. Skull: Normal Sinuses/Orbits: Paranasal sinuses are clear. No mastoid effusion. Normal orbits. Other: None. IMPRESSION: 1. No acute intracranial abnormality. 2. 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 Chronic small vessel ischemia. Electronically Signed   By: Deatra Robinson M.D.   On: 03/03/2023 22:31           LOS: 2 days   Time spent= 35 mins    Miguel Rota, MD [facility]  If 7PM-7AM, please contact night-coverage  03/05/2023, 1:05 PM

## 2023-03-06 DIAGNOSIS — R001 Bradycardia, unspecified: Secondary | ICD-10-CM | POA: Diagnosis not present

## 2023-03-06 LAB — BASIC METABOLIC PANEL
Anion gap: 15 (ref 5–15)
BUN: 23 mg/dL (ref 8–23)
CO2: 27 mmol/L (ref 22–32)
Calcium: 9.5 mg/dL (ref 8.9–10.3)
Chloride: 95 mmol/L — ABNORMAL LOW (ref 98–111)
Creatinine, Ser: 3.7 mg/dL — ABNORMAL HIGH (ref 0.44–1.00)
GFR, Estimated: 13 mL/min — ABNORMAL LOW (ref >60)
Glucose, Bld: 170 mg/dL — ABNORMAL HIGH (ref 70–99)
Potassium: 3.9 mmol/L (ref 3.5–5.1)
Sodium: 137 mmol/L (ref 135–145)

## 2023-03-06 LAB — MAGNESIUM: Magnesium: 1.8 mg/dL (ref 1.7–2.4)

## 2023-03-06 LAB — GLUCOSE, CAPILLARY
Glucose-Capillary: 88 mg/dL (ref 70–99)
Glucose-Capillary: 155 mg/dL — ABNORMAL HIGH (ref 70–99)

## 2023-03-06 MED ORDER — DOXERCALCIFEROL 4 MCG/2ML IV SOLN: 1.0000 ug | INTRAVENOUS | Status: AC

## 2023-03-06 MED ORDER — ISOSORB DINITRATE-HYDRALAZINE 20-37.5 MG PO TABS
2.0000 | ORAL_TABLET | Freq: Three times a day (TID) | ORAL | Status: DC
  Administered 2023-03-06: 2 via ORAL
  Filled 2023-03-06: qty 2

## 2023-03-06 MED ORDER — ISOSORB DINITRATE-HYDRALAZINE 20-37.5 MG PO TABS: 2.0000 | ORAL_TABLET | Freq: Three times a day (TID) | ORAL | Status: AC

## 2023-03-06 NOTE — Evaluation (Signed)
Occupational Therapy Evaluation Patient Details Name: [name] Michael MRN: [id number] DOB: 17-Jan-1961 Today's Date: 03/06/2023   History of Present Illness 62 y.o. female [facility] on 03/03/2023 with hypotension and bradycardia. Pt found to be hyperkalemic, required external pacing. PMH includes ESRD, chronic HFpEF, hypothyroidism, IDDM, GERD, HLD, CVA, osteoporosis.   Clinical Impression   Pt admitted for above, presents close to baseline as she is ambulating and completing ADLs with supervision to Mod I level. Pt does not display deficits in strength or ADL performance, has no further acute skilled OT needs but would benefit from continued mobility with mobility specialists. No post acute OT needs at this time.        If plan is discharge home, recommend the following: Assistance with cooking/housework    Functional Status Assessment  Patient has had a recent decline in their functional status and demonstrates the ability to make significant improvements in function in a reasonable and predictable amount of time.  Equipment Recommendations  None recommended by OT    Recommendations for Other Services       Precautions / Restrictions Precautions Precautions: Fall Restrictions Weight Bearing Restrictions: No      Mobility Bed Mobility               General bed mobility comments: Pt rec'd and left in recliner    Transfers Overall transfer level: Modified independent Equipment used: Rollator (4 wheels)                      Balance Overall balance assessment: Needs assistance Sitting-balance support: No upper extremity supported, Feet supported Sitting balance-Leahy Scale: Good Sitting balance - Comments: in recliner   Standing balance support: Single extremity supported, Reliant on assistive device for balance Standing balance-Leahy Scale: Fair Standing balance comment: standing at sink with UE support                            ADL either performed or assessed with clinical judgement   ADL Overall ADL's : At baseline;Needs assistance/impaired Eating/Feeding: Independent;Sitting   Grooming: Wash/dry face;Oral care;Wash/dry hands;Standing;Supervision/safety   Upper Body Bathing: Sitting;Supervision/ safety   Lower Body Bathing: Sitting/lateral leans;Set up   Upper Body Dressing : Sitting;Independent   Lower Body Dressing: Sitting/lateral leans;Supervision/safety Lower Body Dressing Details (indicate cue type and reason): adjust bilat socks Toilet Transfer: Supervision/safety;Rolling walker (2 wheels) Toilet Transfer Details (indicate cue type and reason): sim following room ambulation Toileting- Clothing Manipulation and Hygiene: Sit to/from stand;Contact guard assist       Functional mobility during ADLs: Supervision/safety;Rollator (4 wheels) General ADL Comments: Pt demonstrating good use of brakes to complete functional mobility and take seated rest breaks     Vision         Perception         Praxis         Pertinent Vitals/Pain Pain Assessment Pain Assessment: No/denies pain     Extremity/Trunk Assessment Upper Extremity Assessment Upper Extremity Assessment: Overall WFL for tasks assessed   Lower Extremity Assessment Lower Extremity Assessment: Overall WFL for tasks assessed   Cervical / Trunk Assessment Cervical / Trunk Assessment: Kyphotic   Communication Communication Communication: No apparent difficulties Cueing Techniques: Verbal cues   Cognition Arousal: Alert Behavior During Therapy: WFL for tasks assessed/performed Overall Cognitive Status: Within Functional Limits for tasks assessed  General Comments  VSS on RA    Exercises     Shoulder Instructions      Home Living Family/patient expects to be discharged to:: Skilled nursing facility                                 Additional  Comments: long term resident at [facility]      Prior Functioning/Environment Prior Level of Function : Needs assist             Mobility Comments: pt reports independence in mobility with rollator ADLs Comments: pt reports independence in ADLs at this time, in the past she has reported assistance with ADLs, likely requires assistance for IADLs        OT Problem List: Other (comment) (Hyperkalemic)      OT Treatment/Interventions:      OT Goals(Current goals can be found in the care plan section) Acute Rehab OT Goals Patient Stated Goal: To go home OT Goal Formulation: With patient Time For Goal Achievement: 03/20/23 Potential to Achieve Goals: Good  OT Frequency:      Co-evaluation              AM-PAC OT "6 Clicks" Daily Activity     Outcome Measure Help from another person eating meals?: None Help from another person taking care of personal grooming?: A Little Help from another person toileting, which includes using toliet, bedpan, or urinal?: A Little Help from another person bathing (including washing, rinsing, drying)?: A Little Help from another person to put on and taking off regular upper body clothing?: None Help from another person to put on and taking off regular lower body clothing?: A Little 6 Click Score: 20   End of Session Equipment Utilized During Treatment: Gait belt;Rollator (4 wheels) Nurse Communication: Mobility status  Activity Tolerance: Patient tolerated treatment well Patient left: in chair;with call bell/phone within reach  OT Visit Diagnosis: History of falling (Z91.81)                Time: 0950-1006 OT Time Calculation (min): 16 min Charges:  OT General Charges $OT Visit: 1 Visit OT Evaluation $OT Eval Low Complexity: 1 Low  03/06/2023  AB, OTR/L  Acute Rehabilitation Services  Office: [phone]   Tristan Schroeder 03/06/2023, 11:20 AM

## 2023-03-06 NOTE — Discharge Planning (Signed)
[facility] Patient Discharge Orders - [facility] CLINIC: [facility]  Patient's name: [name] Michael Admit/DC Dates: 03/03/2023 - 03/06/2023  DISCHARGE DIAGNOSES: Severe bradycardia - required external pacing, caused by #2 Severe hyperkalemia - improved with HD Hypertension  HD ORDER CHANGES: Heparin change: no EDW Change: no  *up on weights here - sorry! Bath Change: no  ANEMIA MANAGEMENT: Aranesp: Given: no   ESA dose for discharge: per protocol IV Iron dose at discharge: per protocol Transfusion: Given: no  BONE/MINERAL MEDICATIONS: Hectorol/Calcitriol change: no Sensipar/Parsabiv change: no  ACCESS INTERVENTION/CHANGE: no Details:  RECENT LABS: Recent Labs  Lab 03/05/23 0427  HGB 9.8*  NA 133*  K 4.2  CALCIUM 8.6*  PHOS 5.3*  ALBUMIN 3.1*   IV ANTIBIOTICS: no Details:  OTHER ANTICOAGULATION: On Coumadin?: no   OTHER/APPTS/LAB ORDERS:  - Check K weekly, if rises may need to add Lokelma  - BP meds adjusted this admit - BiDil added, may be able to come off it as weight comes back down  - Ok for 3L UFG until back to EDW to avoid readmit with overload  D/C Meds to be reconciled by nurse after every discharge.  Completed By: Ozzie Hoyle, PA-C [facility] Pager [phone]   Reviewed by: MD:______ RN_______

## 2023-03-06 NOTE — Hospital Course (Addendum)
  Brief Narrative:  62 year old with history of ESRD on HD, CHF with preserved EF cor pulmonale, DM 2 admitted for severe symptomatic bradycardia diagnosed with hyperkalemia in the setting of taking AV nodal blocker at home including Coreg and clonidine.  Patient immediately emergent dialysis which improved her heart rate and blood pressure.  She was taken off transcutaneous pacing.  Seen by cardiology team.  Over the course of 3 days patient did much better, blood pressure medicine adjusted as above.  Doing well, medically stable for discharge.     Assessment & Plan:  Principal Problem:   Symptomatic bradycardia Active Problems:   ESRD (end stage renal disease) (HCC)   Encephalopathy acute   Hyperkalemia    Symptomatic bradycardia in the setting of hyperkalemia Now stable.  Initially required urgent dialysis.   ESRD on HD TTSa Hypervolemic hyponatremia Emergent HD 9/10, tolerating HD   Acute C secondary to uremia, resolved CT head negative   Hypothyroidism TSH 2.464, T4 1.15.  Continue Synthroid, follow-up outpatient   T2DM Resume home regimen   Anemia of chronic kidney disease Hemoglobin overall stable around 9.5   History of CVA with hyperlipidemia Continue aspirin and statin   Hx HTN, uncontrolled Currently on Norvasc.  BiDil added.   clonidine and Coreg stopped   HFpEF w/ cor pulomonale Seen by cardiology team.  Supportive care.  Volume status managed with dialysis   DVT prophylaxis: heparin injection 5,000 Units Start: 03/03/23 1445     Code Status: Full code Family Communication:   Status is: Inpatient Awaiting PT OT recommendations

## 2023-03-06 NOTE — Evaluation (Signed)
Physical Therapy Brief Evaluation and Discharge Note Patient Details Name: [name] Michael MRN: [id number] DOB: [birth year] Today's Date: 03/06/2023   History of Present Illness  62 y.o. female presents to [facility] hospital on 03/03/2023 with hypotension and bradycardia. Pt found to be hyperkalemic, required external pacing. PMH includes ESRD, chronic HFpEF, hypothyroidism, IDDM, GERD, HLD, CVA, osteoporosis.  Clinical Impression  Pt presents to PT at or near her baseline based on pt report. Pt is able to ambulate with rollator for household distances at this time. Pt has necessary assistance at [facility] for ADL and IADL management. PT recommends discharge back to [facility] when medically appropriate.       PT Assessment    Assistance Needed at Discharge       Equipment Recommendations None recommended by PT  Recommendations for Other Services       Precautions/Restrictions Precautions Precautions: Fall Restrictions Weight Bearing Restrictions: No        Mobility  Bed Mobility          Transfers Overall transfer level: Modified independent Equipment used: Rollator (4 wheels)                    Ambulation/Gait Ambulation/Gait assistance: Modified independent (Device/Increase time) Gait Distance (Feet): 200 Feet Assistive device: Rollator (4 wheels) Gait Pattern/deviations: Step-through pattern   General Gait Details: slow but steady step-through gait  Home Activity Instructions    Stairs            Modified Rankin (Stroke Patients Only)        Balance   Sitting-balance support: No upper extremity supported, Feet supported Sitting balance-Leahy Scale: Good     Standing balance support: Single extremity supported, Reliant on assistive device for balance Standing balance-Leahy Scale: Poor            Pertinent Vitals/Pain   Pain Assessment Pain Assessment: No/denies pain     Home Living               Additional Comments: long  term resident at [facility]    Prior Function        UE/LE Assessment               Communication   Communication Communication: No apparent difficulties Cueing Techniques: Verbal cues     Cognition         General Comments General comments (skin integrity, edema, etc.): VSS on RA    Exercises     Assessment/Plan    PT Problem List         PT Visit Diagnosis Other abnormalities of gait and mobility (R26.89)    No Skilled PT Patient at baseline level of functioning;Patient is modified independent with all activity/mobility;Patient will have necessary level of assist by caregiver at discharge   Co-evaluation                AMPAC 6 Clicks Help needed turning from your back to your side while in a flat bed without using bedrails?: None Help needed moving from lying on your back to sitting on the side of a flat bed without using bedrails?: None Help needed moving to and from a bed to a chair (including a wheelchair)?: None Help needed standing up from a chair using your arms (e.g., wheelchair or bedside chair)?: None Help needed to walk in hospital room?: None Help needed climbing 3-5 steps with a railing? : A Little 6 Click Score: 23      End  of Session   Activity Tolerance: Patient tolerated treatment well Patient left: in chair;with call bell/phone within reach;with chair alarm set Nurse Communication: Mobility status PT Visit Diagnosis: Other abnormalities of gait and mobility (R26.89)     Time: 4696-2952 PT Time Calculation (min) (ACUTE ONLY): 24 min  Charges:   PT Evaluation $PT Eval Low Complexity: 1 Low      Arlyss Gandy, PT, DPT Acute Rehabilitation Office [phone]   Arlyss Gandy  03/06/2023, 10:12 AM

## 2023-03-06 NOTE — Care Management Important Message (Signed)
Important Message  Patient Details  Name: [name] Michael MRN: [id number] Date of Birth: 04/12/1961   Medicare Important Message Given:  Yes     Mardene Sayer 03/06/2023, 3:24 PM

## 2023-03-06 NOTE — TOC Transition Note (Signed)
Transition of Care [facility]) - CM/SW Discharge Note   Patient Details  Name: [name] Michael MRN: [id number] Date of Birth: 05/21/1961  Transition of Care [facility]) CM/SW Contact:  Erin Sons, LCSW Phone Number: 03/06/2023, 1:32 PM   Clinical Narrative:      Per MD patient ready for DC to [facility]. RN, patient, and facility notified of DC. Discharge Summary and FL2 sent to facility. RN to call report prior to discharge [phone]). DC packet on chart. Ambulance transport requested for patient.   CSW will sign off for now as social work intervention is no longer needed. Please consult [name] again if new needs arise.   Final next level of care: Skilled Nursing Facility Barriers to Discharge: No Barriers Identified     Discharge Placement                Patient chooses bed at: [facility] Patient to be transferred to facility by: [facility]   Patient and family notified of of transfer: 03/06/23  Discharge Plan and Services Additional resources added to the After Visit Summary for                                       Social Determinants of Health (SDOH) Interventions SDOH Screenings   Food Insecurity: No Food Insecurity (12/04/2022)  Housing: Low Risk  (12/04/2022)  Transportation Needs: No Transportation Needs (12/04/2022)  Utilities: Not At Risk (12/04/2022)  Depression (PHQ2-9): Low Risk  (05/23/2019)  Tobacco Use: High Risk (12/04/2022)     Readmission Risk Interventions    02/21/2021   10:09 AM 01/31/2021    3:06 PM  Readmission Risk Prevention Plan  Transportation Screening Complete Complete  Medication Review (RN Care Manager) Referral to Pharmacy Referral to Pharmacy  PCP or Specialist appointment within 3-5 days of discharge Complete Complete  HRI or Home Care Consult Complete Complete  SW Recovery Care/Counseling Consult Complete Complete  Palliative Care Screening Not Applicable Not Applicable  Skilled Nursing Facility Complete Complete

## 2023-03-06 NOTE — Progress Notes (Signed)
Cardiologist :  Tenny Craw  Subjective:  Denies SSCP, palpitations or Dyspnea MS improved Eating breakfast   Objective:  Vitals:   03/05/23 1553 03/05/23 2004 03/06/23 0526 03/06/23 0855  BP: (!) 158/56 (!) 160/59 (!) 169/62 (!) 137/96  Pulse: 74 79 74 84  Resp: 17  16 16   Temp: 98.9 F (37.2 C) 98.7 F (37.1 C) 98.5 F (36.9 C) 98.7 F (37.1 C)  TempSrc:  Oral    SpO2: 94% 97% 100% 94%  Weight:        Intake/Output from previous day:  Intake/Output Summary (Last 24 hours) at 03/06/2023 0930 Last data filed at 03/06/2023 0526 Gross per 24 hour  Intake 440 ml  Output 2225 ml  Net -1785 ml    Physical Exam: Chronically ill female Lungs clear SEM JVP elevated with V wave  Fistual in [name] Abdomen benign No edema  Lab Results: Basic Metabolic Panel: Recent Labs    03/03/23 1107 03/03/23 1113 03/04/23 0753 03/05/23 0427  NA 126*   < > 129* 133*  K 6.7*   < > 4.1 4.2  CL 91*   < > 92* 93*  CO2 19*   < > 25 25  GLUCOSE 216*   < > 144* 88  BUN 44*   < > 9 21  CREATININE 5.93*   < > 2.67* 4.23*  CALCIUM 8.6*   < > 8.3* 8.6*  MG 2.2  --  1.4*  --   PHOS  --   --   --  5.3*   < > = values in this interval not displayed.   Liver Function Tests: Recent Labs    03/03/23 1107 03/04/23 0753 03/05/23 0427  AST 20 22  --   ALT 16 17  --   ALKPHOS 108 96  --   BILITOT 0.8 0.5  --   PROT 6.7 6.0*  --   ALBUMIN 3.4* 3.1* 3.1*   Recent Labs    03/03/23 1107  LIPASE 31   CBC: Recent Labs    03/03/23 1107 03/03/23 1113 03/04/23 0753 03/05/23 0427  WBC 8.8  --  6.2 4.8  NEUTROABS 6.8  --   --   --   HGB 12.4   < > 9.4* 9.8*  HCT 39.6   < > 29.7* 30.9*  MCV 94.3  --  90.8 92.0  PLT 235  --  160 156   < > = values in this interval not displayed.    Thyroid Function Tests: Recent Labs    03/03/23 1405  TSH 2.464   Imaging: No results found.  Cardiac Studies:  ECG: Sinus rate 76 RBBB    Telemetry:  SR rates 70-80's   Echo: 07/19/22 severe TR / RV  failure LVEF 60-65%   Medications:    amLODipine  10 mg Oral Daily   aspirin EC  81 mg Oral Daily   atorvastatin  20 mg Oral Daily   Chlorhexidine Gluconate Cloth  6 each Topical Q0600   doxercalciferol  1 mcg Intravenous Q T,Th,Sa-HD   heparin  5,000 Units Subcutaneous Q8H   insulin aspart  0-9 Units Subcutaneous TID WC   isosorbide-hydrALAZINE  2 tablet Oral TID   mouth rinse  15 mL Mouth Rinse 4 times per day   sevelamer carbonate  800 mg Oral TID WC      Assessment/Plan:   Bradycardia/CHB:  in setting of hyperkalemia. Resolved with aggressive Rx and dialysis K now 4.2.  External pacer d/c  Sinus with chronic RBBB No indication for PPM HtN:  on norvasc would avoid clonidine Bidil added 03/05/23  HLD:  continue statin  CRF:  No dialysis today ? D/c back to assisted living   Per nephrology and primary service     Charlton Haws 03/06/2023, 9:30 AM

## 2023-03-06 NOTE — Progress Notes (Signed)
  [facility] Progress Note   Subjective:  Seen in room - feels ok today. Bradycardia resolved with correction of hyperkalemia. We discussed that even if her SNF provides high K foods - she needs to avoid eating them - we discussed foods, seems like potatoes are an issue. Denies CP/dyspnea today. ?Discharge today.  Objective Vitals:   03/05/23 1553 03/05/23 2004 03/06/23 0526 03/06/23 0855  BP: (!) 158/56 (!) 160/59 (!) 169/62 (!) 137/96  Pulse: 74 79 74 84  Resp: 17  16 16   Temp: 98.9 F (37.2 C) 98.7 F (37.1 C) 98.5 F (36.9 C) 98.7 F (37.1 C)  TempSrc:  Oral    SpO2: 94% 97% 100% 94%  Weight:       Physical Exam General: Chronically ill appearing woman, NAD. Room air. Heart: RRR; no murmur Lungs: CTAB; no rales Abdomen: soft, non-tender Extremities: no LE edema Dialysis Access:  AVF + bruit  Additional Objective Labs: Basic Metabolic Panel: Recent Labs  Lab 03/03/23 1843 03/04/23 0753 03/05/23 0427  NA 132* 129* 133*  K 3.0* 4.1 4.2  CL 89* 92* 93*  CO2 27 25 25   GLUCOSE 70 144* 88  BUN 5* 9 21  CREATININE 1.14* 2.67* 4.23*  CALCIUM 8.1* 8.3* 8.6*  PHOS  --   --  5.3*   Liver Function Tests: Recent Labs  Lab 03/03/23 1107 03/04/23 0753 03/05/23 0427  AST 20 22  --   ALT 16 17  --   ALKPHOS 108 96  --   BILITOT 0.8 0.5  --   PROT 6.7 6.0*  --   ALBUMIN 3.4* 3.1* 3.1*   Recent Labs  Lab 03/03/23 1107  LIPASE 31   CBC: Recent Labs  Lab 03/03/23 1107 03/03/23 1113 03/04/23 0753 03/05/23 0427  WBC 8.8  --  6.2 4.8  NEUTROABS 6.8  --   --   --   HGB 12.4 13.9 9.4* 9.8*  HCT 39.6 41.0 29.7* 30.9*  MCV 94.3  --  90.8 92.0  PLT 235  --  160 156   Medications:   amLODipine  10 mg Oral Daily   aspirin EC  81 mg Oral Daily   atorvastatin  20 mg Oral Daily   Chlorhexidine Gluconate Cloth  6 each Topical Q0600   doxercalciferol  1 mcg Intravenous Q T,Th,Sa-HD   heparin  5,000 Units Subcutaneous Q8H   insulin aspart  0-9 Units  Subcutaneous TID WC   isosorbide-hydrALAZINE  2 tablet Oral TID   mouth rinse  15 mL Mouth Rinse 4 times per day   sevelamer carbonate  800 mg Oral TID WC   Dialysis Orders: TTS at [facility] 3:45hr, 350/500, EDW 33kg, 2K/2Ca, AVF, 16g needles, no heparin - no ESA - Hectoral IV q HD - binder: Renvela 1/meals  Assessment/Plan: 1. Severe hyperkalemia -> severe bradycardia requiring external pacing: Now corrected/stabilized. Cardiology has signed off. Will follow as outpatient - may need to consider Lokelma for her. 2. ESRD: Usual TTS schedule -> next tomorrow if remains in patient. 3. HTN/volume: BP better this AM. ON amlodipine and BiDil now - avoid BB and/or clonidine for now. 4. Anemia of ESRD: Hgb 9.8 - follow, start ESA if remains low. 5. Secondary hyperparathyroidism:  Ca/Phos fine - continue home meds. 6. Nutrition: Alb low - encouraged ^ protein. 7. Dispo: Ok to return to SNF from renal standpoint.  Ozzie Hoyle, PA-C 03/06/2023, 9:35 AM  [employer]

## 2023-03-06 NOTE — Progress Notes (Signed)
Pt to return back to [facility] at approx 1430.  Report given to Dwana Curd, Charity fundraiser.  Pt aware of discharge plan and is in agreement.  Tele monitor and IV's to be removed.

## 2023-03-06 NOTE — Progress Notes (Signed)
D/C order noted. Contacted [facility] to advise clinic of pt's d/c today and that pt should resume care tomorrow.   Olivia Canter Renal Navigator [phone]

## 2023-03-06 NOTE — Discharge Summary (Addendum)
Physician Discharge Summary  QIANNA KARCHER WUJ:[id number] DOB: [birth year] DOA: 03/03/2023  PCP: Renford Dills, MD  Admit date: 03/03/2023 Discharge date: 03/06/2023  [facility]: [facility] Disposition: [facility]  Recommendations for Outpatient Follow-up:  Follow up with PCP in 1-2 weeks Please obtain BMP/CBC in one week your next doctors visit.  Discontinue clonidine and cortrak BiDil added.  Continue adjust blood pressure medicines as needed   Discharge Condition: Stable CODE STATUS: Full code Diet recommendation: Heart healthy  Brief/Interim Summary:  Brief Narrative:  62 year old with history of ESRD on HD, CHF with preserved EF cor pulmonale, DM 2 admitted for severe symptomatic bradycardia diagnosed with hyperkalemia in the setting of taking AV nodal blocker at home including Coreg and clonidine.  Patient immediately emergent dialysis which improved her heart rate and blood pressure.  She was taken off transcutaneous pacing.  Seen by cardiology team.  Over the course of 3 days patient did much better, blood pressure medicine adjusted as above.  Doing well, medically stable for discharge.     Assessment & Plan:  Principal Problem:   Symptomatic bradycardia Active Problems:   ESRD (end stage renal disease) (HCC)   Encephalopathy acute   Hyperkalemia    Symptomatic bradycardia in the setting of hyperkalemia Now stable.  Initially required urgent dialysis.   ESRD on HD TTSa Hypervolemic hyponatremia Emergent HD 9/10, tolerating HD   Acute C secondary to uremia, resolved CT head negative   Hypothyroidism TSH 2.464, T4 1.15.  Continue Synthroid, follow-up outpatient   T2DM Resume home regimen   Anemia of chronic kidney disease Hemoglobin overall stable around 9.5   History of CVA with hyperlipidemia Continue aspirin and statin   Hx HTN, uncontrolled Currently on Norvasc.  BiDil added.   clonidine and Coreg stopped   HFpEF w/ cor pulomonale Seen by  cardiology team.  Supportive care.  Volume status managed with dialysis   DVT prophylaxis: heparin injection 5,000 Units Start: 03/03/23 1445     Code Status: Full code Family Communication:   Status is: Inpatient Awaiting PT OT recommendations   Discharge Diagnoses:  Principal Problem:   Symptomatic bradycardia Active Problems:   ESRD (end stage renal disease) (HCC)   Encephalopathy acute   Hyperkalemia      Consultations: Cardiology Nephrology  Subjective: No complaints doing well.  Discharge Exam: Vitals:   03/06/23 0526 03/06/23 0855  BP: (!) 169/62 (!) 137/96  Pulse: 74 84  Resp: 16 16  Temp: 98.5 F (36.9 C) 98.7 F (37.1 C)  SpO2: 100% 94%   Vitals:   03/05/23 1553 03/05/23 2004 03/06/23 0526 03/06/23 0855  BP: (!) 158/56 (!) 160/59 (!) 169/62 (!) 137/96  Pulse: 74 79 74 84  Resp: 17  16 16   Temp: 98.9 F (37.2 C) 98.7 F (37.1 C) 98.5 F (36.9 C) 98.7 F (37.1 C)  TempSrc:  Oral    SpO2: 94% 97% 100% 94%  Weight:        General: Pt is alert, awake, not in acute distress Cardiovascular: RRR, S1/S2 +, no rubs, no gallops Respiratory: CTA bilaterally, no wheezing, no rhonchi Abdominal: Soft, NT, ND, bowel sounds + Extremities: no edema, no cyanosis  Discharge Instructions   Allergies as of 03/06/2023       Reactions   Hydrocodone Nausea And Vomiting, Other (See Comments)   "Allergic," per [facility]        Medication List     STOP taking these medications    carvedilol 12.5 MG tablet  Commonly known as: COREG   cloNIDine 0.1 MG tablet Commonly known as: CATAPRES       TAKE these medications    acetaminophen 500 MG tablet Commonly known as: TYLENOL Take 500 mg by mouth every 6 (six) hours as needed (for pain).   albuterol 108 (90 Base) MCG/ACT inhaler Commonly known as: VENTOLIN HFA Inhale 1 puff into the lungs every 6 (six) hours as needed for wheezing or shortness of breath.   amLODipine 10 MG tablet Commonly known as:  NORVASC Take 1 tablet (10 mg total) by mouth daily.   aspirin 81 MG chewable tablet Chew 81 mg by mouth daily.   atorvastatin 20 MG tablet Commonly known as: LIPITOR Take 20 mg by mouth every evening.   AVEENO ANTI-ITCH EX Apply 1 application  topically in the morning and at bedtime. Apply to back   b complex-vitamin c-folic acid 0.8 MG Tabs tablet Take 1 tablet by mouth at bedtime.   chlorhexidine 0.12 % solution Commonly known as: Peridex Use as directed 15 mLs in the mouth or throat 2 (two) times daily.   doxercalciferol 4 MCG/2ML injection Commonly known as: HECTOROL Inject 0.5 mLs (1 mcg total) into the vein Every Tuesday,Thursday,and Saturday with dialysis. Start taking on: March 07, 2023   fish oil-omega-3 fatty acids 1000 MG capsule Take 1 g by mouth in the morning and at bedtime.   isosorbide-hydrALAZINE 20-37.5 MG tablet Commonly known as: BIDIL Take 2 tablets by mouth 3 (three) times daily.   levothyroxine 125 MCG tablet Commonly known as: SYNTHROID Take 125 mcg by mouth daily before breakfast.   midodrine 5 MG tablet Commonly known as: PROAMATINE Take 5 mg by mouth See admin instructions. Take 5 mg by mouth in the morning as directed on Tues/Thurs/Sat   omeprazole 20 MG capsule Commonly known as: PRILOSEC Take 20 mg by mouth in the morning.   OXYGEN Inhale 2 L/min into the lungs as needed (for a saturation less than 90%).   senna-docusate 8.6-50 MG tablet Commonly known as: Senokot-S Take 1 tablet by mouth at bedtime as needed for mild constipation. What changed: when to take this   sevelamer carbonate 800 MG tablet Commonly known as: RENVELA Take 800 mg by mouth 3 (three) times daily with meals.        Follow-up Information     Renford Dills, MD Follow up in 1 week(s).   Specialty: Internal Medicine Contact information: [address] [place] [address] [phone]                Allergies  Allergen  Reactions   Hydrocodone Nausea And Vomiting and Other (See Comments)    "Allergic," per [facility] [place]    You were cared for by a hospitalist during your hospital stay. If you have any questions about your discharge medications or the care you received while you were in the hospital after you are discharged, you can call the unit and asked to speak with the hospitalist on call if the hospitalist that took care of you is not available. Once you are discharged, your primary care physician will handle any further medical issues. Please note that no refills for any discharge medications will be authorized once you are discharged, as it is imperative that you return to your primary care physician (or establish a relationship with a primary care physician if you do not have one) for your aftercare needs so that they can reassess your need for medications and monitor your  lab values.  You were cared for by a hospitalist during your hospital stay. If you have any questions about your discharge medications or the care you received while you were in the hospital after you are discharged, you can call the unit and asked to speak with the hospitalist on call if the hospitalist that took care of you is not available. Once you are discharged, your primary care physician will handle any further medical issues. Please note that NO REFILLS for any discharge medications will be authorized once you are discharged, as it is imperative that you return to your primary care physician (or establish a relationship with a primary care physician if you do not have one) for your aftercare needs so that they can reassess your need for medications and monitor your lab values.  Please request your Prim.MD to go over all Hospital Tests and Procedure/Radiological results at the follow up, please get all Hospital records sent to your Prim MD by signing hospital release before you go home.  Get CBC, CMP, 2 view Chest X ray checked  by Primary MD  during your next visit or SNF MD in 5-7 days ( we routinely change or add medications that can affect your baseline labs and fluid status, therefore we recommend that you get the mentioned basic workup next visit with your PCP, your PCP may decide not to get them or add new tests based on their clinical decision)  On your next visit with your primary care physician please Get Medicines reviewed and adjusted.  If you experience worsening of your admission symptoms, develop shortness of breath, life threatening emergency, suicidal or homicidal thoughts you must seek medical attention immediately by calling 911 or calling your MD immediately  if symptoms less severe.  You Must read complete instructions/literature along with all the possible adverse reactions/side effects for all the Medicines you take and that have been prescribed to you. Take any new Medicines after you have completely understood and accpet all the possible adverse reactions/side effects.   Do not drive, operate heavy machinery, perform activities at heights, swimming or participation in water activities or provide baby sitting services if your were admitted for syncope or siezures until you have seen by Primary MD or a Neurologist and advised to do so again.  Do not drive when taking Pain medications.   Procedures/Studies: CT HEAD WO CONTRAST ( )  Result Date: 03/03/2023 CLINICAL DATA:  Acute neurologic deficit EXAM: CT HEAD WITHOUT CONTRAST TECHNIQUE: Contiguous axial images were obtained from the base of the skull through the vertex without intravenous contrast. RADIATION DOSE REDUCTION: This exam was performed according to the departmental dose-optimization program which includes automated exposure control, adjustment of the mA and/or kV according to patient size and/or use of iterative reconstruction technique. COMPARISON:  11/29/2022 FINDINGS: Brain: No mass, hemorrhage or extra-axial collection. Multiple old small vessel  infarcts of the deep gray nuclei. Periventricular white matter hypoattenuation. Mild volume loss. Vascular: Carotid and vertebral atherosclerosis at the skull base. Skull: Normal Sinuses/Orbits: Paranasal sinuses are clear. No mastoid effusion. Normal orbits. Other: None. IMPRESSION: 1. No acute intracranial abnormality. 2. 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 Chronic small vessel ischemia. Electronically Signed   By: Deatra Robinson M.D.   On: 03/03/2023 22:31   DG Chest Portable 1 View  Result Date: 03/03/2023 CLINICAL DATA:  Hypoxic respiratory failure. EXAM: PORTABLE CHEST 1 VIEW COMPARISON:  Chest radiographs 12/06/2022. FINDINGS: Low lung volumes emphasize the pulmonary vasculature and baseline  cardiomegaly. No consolidation or pulmonary edema. No pleural effusion or pneumothorax. IMPRESSION: Low lung volumes without evidence of acute cardiopulmonary disease. Cardiomegaly. Electronically Signed   By: Orvan Falconer M.D.   On: 03/03/2023 14:28     The results of significant diagnostics from this hospitalization (including imaging, microbiology, ancillary and laboratory) are listed below for reference.     Microbiology: Recent Results (from the past 240 hour(s))  MRSA Next Gen by PCR, Nasal     Status: None   Collection Time: 03/03/23  1:17 PM   Specimen: Nasal Mucosa; Nasal Swab  Result Value Ref Range Status   MRSA by PCR Next Gen NOT DETECTED NOT DETECTED Final    Comment: (NOTE) The GeneXpert MRSA Assay (FDA approved for NASAL specimens only), is one component of a comprehensive MRSA colonization surveillance program. It is not intended to diagnose MRSA infection nor to guide or monitor treatment for MRSA infections. Test performance is not FDA approved in patients less than 8 years old. Performed at [facility], [address], [place], [place] [address]      Labs: BNP (last 3 results) Recent Labs    07/18/22 1323 12/04/22 1425 03/03/23 1107   BNP 4,488.6* 3,103.7* 1,274.1*   Basic Metabolic Panel: Recent Labs  Lab 03/03/23 1107 03/03/23 1113 03/03/23 1843 03/04/23 0753 03/05/23 0427  NA 126* 125* 132* 129* 133*  K 6.7* 6.8* 3.0* 4.1 4.2  CL 91* 96* 89* 92* 93*  CO2 19*  --  27 25 25   GLUCOSE 216* 215* 70 144* 88  BUN 44* 45* 5* 9 21  CREATININE 5.93* 6.70* 1.14* 2.67* 4.23*  CALCIUM 8.6*  --  8.1* 8.3* 8.6*  MG 2.2  --   --  1.4*  --   PHOS  --   --   --   --  5.3*   Liver Function Tests: Recent Labs  Lab 03/03/23 1107 03/04/23 0753 03/05/23 0427  AST 20 22  --   ALT 16 17  --   ALKPHOS 108 96  --   BILITOT 0.8 0.5  --   PROT 6.7 6.0*  --   ALBUMIN 3.4* 3.1* 3.1*   Recent Labs  Lab 03/03/23 1107  LIPASE 31   No results for input(s): "AMMONIA" in the last 168 hours. CBC: Recent Labs  Lab 03/03/23 1107 03/03/23 1113 03/04/23 0753 03/05/23 0427  WBC 8.8  --  6.2 4.8  NEUTROABS 6.8  --   --   --   HGB 12.4 13.9 9.4* 9.8*  HCT 39.6 41.0 29.7* 30.9*  MCV 94.3  --  90.8 92.0  PLT 235  --  160 156   Cardiac Enzymes: No results for input(s): "CKTOTAL", "CKMB", "CKMBINDEX", "TROPONINI" in the last 168 hours. BNP: Invalid input(s): "POCBNP" CBG: Recent Labs  Lab 03/05/23 1258 03/05/23 1554 03/05/23 2138 03/06/23 0721 03/06/23 1118  GLUCAP 96 183* 152* 88 155*   D-Dimer No results for input(s): "DDIMER" in the last 72 hours. Hgb A1c No results for input(s): "HGBA1C" in the last 72 hours. Lipid Profile No results for input(s): "CHOL", "HDL", "LDLCALC", "TRIG", "CHOLHDL", "LDLDIRECT" in the last 72 hours. Thyroid function studies Recent Labs    03/03/23 1405  TSH 2.464   Anemia work up No results for input(s): "VITAMINB12", "FOLATE", "FERRITIN", "TIBC", "IRON", "RETICCTPCT" in the last 72 hours. Urinalysis    Component Value Date/Time   COLORURINE YELLOW 02/17/2021 0322   APPEARANCEUR HAZY (A) 02/17/2021 0322   LABSPEC 1.011  02/17/2021 0322   PHURINE 8.0 02/17/2021 0322    GLUCOSEU 50 (A) 02/17/2021 0322   HGBUR NEGATIVE 02/17/2021 0322   BILIRUBINUR NEGATIVE 02/17/2021 0322   BILIRUBINUR n 05/25/2012 1202   KETONESUR 5 (A) 02/17/2021 0322   PROTEINUR 100 (A) 02/17/2021 0322   UROBILINOGEN 0.2 05/25/2012 1202   UROBILINOGEN 0.2 03/29/2011 0131   NITRITE NEGATIVE 02/17/2021 0322   LEUKOCYTESUR SMALL (A) 02/17/2021 0322   Sepsis Labs Recent Labs  Lab 03/03/23 1107 03/04/23 0753 03/05/23 0427  WBC 8.8 6.2 4.8   Microbiology Recent Results (from the past 240 hour(s))  MRSA Next Gen by PCR, Nasal     Status: None   Collection Time: 03/03/23  1:17 PM   Specimen: Nasal Mucosa; Nasal Swab  Result Value Ref Range Status   MRSA by PCR Next Gen NOT DETECTED NOT DETECTED Final    Comment: (NOTE) The GeneXpert MRSA Assay (FDA approved for NASAL specimens only), is one component of a comprehensive MRSA colonization surveillance program. It is not intended to diagnose MRSA infection nor to guide or monitor treatment for MRSA infections. Test performance is not FDA approved in patients less than 10 years old. Performed at [facility], [address] [place], [place] [address]      Time coordinating discharge:  I have spent 35 minutes face to face with the patient and on the ward discussing the patients care, assessment, plan and disposition with other care givers. >50% of the time was devoted counseling the patient about the risks and benefits of treatment/Discharge disposition and coordinating care.   SIGNED:   Miguel Rota, MD  [facility] 03/06/2023, 12:49 PM   If 7PM-7AM, please contact night-coverage

## 2023-07-07 ENCOUNTER — Emergency Department (HOSPITAL_COMMUNITY)
Admission: EM | Admit: 2023-07-07 | Discharge: 2023-07-07 | Disposition: A | Discharge status: Home / Self Care | Payer: Medicare Other | Attending: Emergency Medicine | Admitting: Emergency Medicine

## 2023-07-07 ENCOUNTER — Emergency Department (HOSPITAL_COMMUNITY): Payer: Medicare Other

## 2023-07-07 DIAGNOSIS — I120 Hypertensive chronic kidney disease with stage 5 chronic kidney disease or end stage renal disease: Secondary | ICD-10-CM | POA: Diagnosis not present

## 2023-07-07 DIAGNOSIS — M25561 Pain in right knee: Secondary | ICD-10-CM | POA: Diagnosis present

## 2023-07-07 DIAGNOSIS — Z79899 Other long term (current) drug therapy: Secondary | ICD-10-CM | POA: Insufficient documentation

## 2023-07-07 DIAGNOSIS — E1122 Type 2 diabetes mellitus with diabetic chronic kidney disease: Secondary | ICD-10-CM | POA: Diagnosis not present

## 2023-07-07 DIAGNOSIS — N186 End stage renal disease: Secondary | ICD-10-CM | POA: Insufficient documentation

## 2023-07-07 DIAGNOSIS — Z7982 Long term (current) use of aspirin: Secondary | ICD-10-CM | POA: Insufficient documentation

## 2023-07-07 DIAGNOSIS — Z992 Dependence on renal dialysis: Secondary | ICD-10-CM | POA: Insufficient documentation

## 2023-07-07 LAB — I-STAT CHEM 8, ED
BUN: 45 mg/dL — ABNORMAL HIGH (ref 8–23)
Calcium, Ion: 0.8 mmol/L — CL (ref 1.15–1.40)
Chloride: 105 mmol/L (ref 98–111)
Creatinine, Ser: 7.2 mg/dL — ABNORMAL HIGH (ref 0.44–1.00)
Glucose, Bld: 107 mg/dL — ABNORMAL HIGH (ref 70–99)
HCT: 34 % — ABNORMAL LOW (ref 36.0–46.0)
Hemoglobin: 11.6 g/dL — ABNORMAL LOW (ref 12.0–15.0)
Potassium: 6.1 mmol/L — ABNORMAL HIGH (ref 3.5–5.1)
Sodium: 133 mmol/L — ABNORMAL LOW (ref 135–145)
TCO2: 23 mmol/L (ref 22–32)

## 2023-07-07 MED ORDER — OXYCODONE-ACETAMINOPHEN 5-325 MG PO TABS
1.0000 | ORAL_TABLET | Freq: Once | ORAL | Status: AC
  Administered 2023-07-07: 1 via ORAL
  Filled 2023-07-07: qty 1

## 2023-07-07 MED ORDER — SODIUM ZIRCONIUM CYCLOSILICATE 10 G PO PACK
20.0000 g | PACK | ORAL | Status: AC
  Administered 2023-07-07: 20 g via ORAL
  Filled 2023-07-07: qty 2

## 2023-07-07 MED ORDER — ACETAMINOPHEN 325 MG PO TABS: 650.0000 mg | ORAL_TABLET | Freq: Once | ORAL | Status: DC

## 2023-07-07 NOTE — ED Triage Notes (Signed)
 Patient BIB [facility] from dialysis for knee pain starting this morning after she felt a pop while transferring to her wheelchair. Patient unable to receive dialysis treatment d/t pain. VSS

## 2023-07-07 NOTE — Progress Notes (Signed)
 [facility] consult. No orders currently to substantiate PIV placement. Notified nurse. Tomasita Morrow, RN [facility]

## 2023-07-07 NOTE — Discharge Instructions (Signed)
 It was a pleasure taking part in your care.  As we discussed, no obvious fracture was noted on your x-ray.  You have a small joint effusion in your knee which is an area of fluid that is most likely causing her pain.  Please keep your leg elevated overnight.  Please follow-up with dialysis in the morning for dialysis session.  I reached out to your nephrology group and they are expecting you.  Please take Tylenol  for pain control at home.  Please return with any new or worsening symptoms.

## 2023-07-07 NOTE — ED Notes (Signed)
 Patient is from [facility]

## 2023-07-07 NOTE — ED Provider Notes (Signed)
 [place] EMERGENCY DEPARTMENT AT [facility] Provider Note   CSN: [id number] Arrival date & time: 07/07/23  1223     History  Chief Complaint  Patient presents with   Knee Pain    [name] Michael is a 63 y.o. female with medical history of ESRD on dialysis Tuesday Thursday Saturday, hypertension, type 2 diabetes, ambulates with walker.  Patient presents to ED for evaluation of right-sided knee pain.  Reports that she was at dialysis today when she was transferring from her wheelchair into the dialysis chair when her knee all of a sudden popped and she had immediate pain.  Patient was unable to complete dialysis session secondary to her pain.  She denies any preceding trauma of this pain, states that it all began when she transferred.  She states all the pain is in her right knee.  Denies a history of surgery to her right knee.  Denies fevers, nausea or vomiting at home.  Denies chest pain, shortness of breath.  Denies feeling as if she is volume overloaded.   Knee Pain      Home Medications Prior to Admission medications   Medication Sig Start Date End Date Taking? Authorizing Provider  acetaminophen  (TYLENOL ) 500 MG tablet Take 500 mg by mouth every 6 (six) hours as needed (for pain).    [provider]  albuterol  (VENTOLIN  HFA) 108 (90 Base) MCG/ACT inhaler Inhale 1 puff into the lungs every 6 (six) hours as needed for wheezing or shortness of breath.    [provider]  amLODipine  (NORVASC ) 10 MG tablet Take 1 tablet (10 mg total) by mouth daily. 12/08/22   Jillian Buttery, MD  aspirin  81 MG chewable tablet Chew 81 mg by mouth daily.    [provider]  atorvastatin  (LIPITOR) 20 MG tablet Take 20 mg by mouth every evening.    [provider]  b complex-vitamin c -folic acid  (NEPHRO-VITE) 0.8 MG TABS tablet Take 1 tablet by mouth at bedtime.    [provider]  chlorhexidine  (PERIDEX ) 0.12 % solution Use as directed 15 mLs in  the mouth or throat 2 (two) times daily. 11/29/22   Palumbo, April, MD  doxercalciferol  (HECTOROL ) 4 MCG/2ML injection Inject 0.5 mLs (1 mcg total) into the vein Every Tuesday,Thursday,and Saturday with dialysis. 03/07/23   Amin, Ankit C, MD  fish oil-omega-3 fatty acids 1000 MG capsule Take 1 g by mouth in the morning and at bedtime.    [provider]  isosorbide -hydrALAZINE  (BIDIL ) 20-37.5 MG tablet Take 2 tablets by mouth 3 (three) times daily. 03/06/23   Amin, Ankit C, MD  levothyroxine  (SYNTHROID ) 125 MCG tablet Take 125 mcg by mouth daily before breakfast.    [provider]  midodrine  (PROAMATINE ) 5 MG tablet Take 5 mg by mouth See admin instructions. Take 5 mg by mouth in the morning as directed on Tues/Thurs/Sat    [provider]  omeprazole (PRILOSEC) 20 MG capsule Take 20 mg by mouth in the morning.    [provider]  OXYGEN Inhale 2 L/min into the lungs as needed (for a saturation less than 90%).    [provider]  Pramoxine-Calamine (AVEENO ANTI-ITCH EX) Apply 1 application  topically in the morning and at bedtime. Apply to back    [provider]  senna-docusate (SENOKOT-S) 8.6-50 MG tablet Take 1 tablet by mouth at bedtime as needed for mild constipation. Patient taking differently: Take 1 tablet by mouth daily as needed for mild constipation. 06/11/18  Regalado, [name], MD  sevelamer  carbonate (RENVELA ) 800 MG tablet Take 800 mg by mouth 3 (three) times daily with meals.    [provider]      Allergies    Hydrocodone    Review of Systems   Review of Systems  Musculoskeletal:  Positive for arthralgias.  All other systems reviewed and are negative.   Physical Exam Updated Vital Signs BP (!) 154/71 (BP Location: Right Arm)   Pulse (!) 103   Temp 98.2 F (36.8 C) (Oral)   Resp (!) 21   LMP 03/06/2013 (LMP Unknown)   SpO2 95%  Physical Exam Vitals and nursing note reviewed.  Constitutional:      General:  She is not in acute distress.    Appearance: Normal appearance. She is not ill-appearing, toxic-appearing or diaphoretic.  HENT:     Head: Normocephalic and atraumatic.     Nose: Nose normal.     Mouth/Throat:     Mouth: Mucous membranes are moist.     Pharynx: Oropharynx is clear.  Eyes:     Extraocular Movements: Extraocular movements intact.     Conjunctiva/sclera: Conjunctivae normal.     Pupils: Pupils are equal, round, and reactive to light.  Cardiovascular:     Rate and Rhythm: Normal rate and regular rhythm.  Pulmonary:     Effort: Pulmonary effort is normal.     Breath sounds: Normal breath sounds. No wheezing.  Abdominal:     General: Abdomen is flat. Bowel sounds are normal.     Palpations: Abdomen is soft.     Tenderness: There is no abdominal tenderness.  Musculoskeletal:     Cervical back: Normal range of motion and neck supple.     Comments: No obvious deformity to patient right knee.  Passive range of motion intact.  No swelling noted.  No overlying skin change, erythema or warmth.  Skin:    General: Skin is warm and dry.     Capillary Refill: Capillary refill takes less than 2 seconds.  Neurological:     Mental Status: She is alert and oriented to person, place, and time.     ED Results / Procedures / Treatments   Labs (all labs ordered are listed, but only abnormal results are displayed) Labs Reviewed  I-STAT CHEM 8, ED - Abnormal; Notable for the following components:      Result Value   Sodium 133 (*)    Potassium 6.1 (*)    BUN 45 (*)    Creatinine, Ser 7.20 (*)    Glucose, Bld 107 (*)    Calcium , Ion 0.80 (*)    Hemoglobin 11.6 (*)    HCT 34.0 (*)    All other components within normal limits    EKG EKG Interpretation Date/Time:  Tuesday July 07 2023 16:23:44 EST Ventricular Rate:  102 PR Interval:  162 QRS Duration:  122 QT Interval:  390 QTC Calculation: 508 R Axis:   115  Text Interpretation: Sinus tachycardia Right bundle branch  block Abnormal ECG When compared with ECG of 03-Mar-2023 11:23, PREVIOUS ECG IS PRESENT Confirmed by Dasie Faden ([id number]) on 07/07/2023 5:27:36 PM  Radiology DG Knee 2 Views Right Result Date: 07/07/2023 CLINICAL DATA:  Dialysis patient, right knee pain EXAM: RIGHT KNEE - 1-2 VIEW COMPARISON:  11/29/2022 FINDINGS: Normal alignment. Bones are osteopenic. No acute osseous finding or displaced fracture. Patella is located. Possible small effusion on the cross-table lateral view. Peripheral vascular calcifications noted. IMPRESSION: 1. Osteopenia. No acute  osseous finding. 2. Possible small effusion. Electronically Signed   By: CHRISTELLA.  Shick M.D.   On: 07/07/2023 15:46    Procedures Procedures   Medications Ordered in ED Medications  oxyCODONE -acetaminophen  (PERCOCET/ROXICET) 5-325 MG per tablet 1 tablet (1 tablet Oral Given 07/07/23 1723)  sodium zirconium cyclosilicate  (LOKELMA ) packet 20 g (20 g Oral Given 07/07/23 1836)    ED Course/ Medical Decision Making/ A&P Clinical Course as of 07/07/23 1845  Tue Jul 07, 2023  1823 20g lokelma  [CG]    Clinical Course User Index [CG] Ruthell Lonni JULIANNA DEVONNA  = Medical Decision Making Risk Prescription drug management.   63 year old female presents to ED for evaluation of right knee pain.  Please see HPI for further details.  On exam patient right knee has no obvious deformity.  No swelling, warmth or erythema noted.  She has full passive range of motion of her right knee.  Also has slightly reduced range of motion actively secondary to pain.  No obvious swelling noted.  Will collect x-ray, will also collect i-STAT Chem-8 as the patient reports that she was due for dialysis today but had to miss due to pain.  X-ray of right knee shows small effusion but no other acute process.  Patient i-STAT Chem-8 shows potassium 6.1, sodium 133, creatinine 7.2.  Reached out to nephrology who advises giving patient 20 g Lokelma  by mouth.  Patient will be able to  be dialyzed tomorrow.  Patient also given oxycodone  for pain control.  At this time, patient will be discharged home.   Final Clinical Impression(s) / ED Diagnoses Final diagnoses:  Acute pain of right knee    Rx / DC Orders ED Discharge Orders     None         Ruthell Lonni JULIANNA DEVONNA 07/07/23 1845    Dasie Faden, MD 07/10/23 2329

## 2023-07-07 NOTE — ED Provider Triage Note (Signed)
 Emergency Medicine Provider Triage Evaluation Note  [name] Michael , a 63 y.o. female  was evaluated in triage.  Pt complains of knee pain.  She stood up to go to dialysis and felt a pop in her knee.  She did not receive dialysis today.  Review of Systems   Physical Exam  BP (!) 145/56   Pulse 98   Temp 98.3 F (36.8 C) (Oral)   Resp 16   LMP 03/06/2013 (LMP Unknown)   SpO2 99%  Gen:   Awake, no distress   Resp:  Normal effort  MSK:   Moves extremities without difficulty  Other:  Pain in the right knee, mild swelling.  Medical Decision Making  Medically screening exam initiated at 2:03 PM.  Appropriate orders placed.  [name] was informed that the remainder of the evaluation will be completed by another provider, this initial triage assessment does not replace that evaluation, and the importance of remaining in the ED until their evaluation is complete.  Mild swelling the knee.  Good pulses, warm well-perfused.  Plain films ordered.  I-STAT ordered to check potassium.  Does not appear fluid overloaded or in respiratory distress.   Mannie Pac T, DO 07/07/23 1404

## 2023-12-22 DEATH — deceased
